# Patient Record
Sex: Male | Born: 1960 | Race: White | Hispanic: No | Marital: Single | Smoking: Former smoker
Health system: Southern US, Community
[De-identification: ages and names within clinical notes are randomized; demographics above are authoritative.]

## PROBLEM LIST (undated history)

## (undated) DIAGNOSIS — F32A Depression, unspecified: Secondary | ICD-10-CM

## (undated) DIAGNOSIS — F319 Bipolar disorder, unspecified: Secondary | ICD-10-CM

## (undated) DIAGNOSIS — I10 Essential (primary) hypertension: Secondary | ICD-10-CM

## (undated) DIAGNOSIS — F419 Anxiety disorder, unspecified: Secondary | ICD-10-CM

## (undated) DIAGNOSIS — I319 Disease of pericardium, unspecified: Secondary | ICD-10-CM

## (undated) DIAGNOSIS — K219 Gastro-esophageal reflux disease without esophagitis: Secondary | ICD-10-CM

## (undated) DIAGNOSIS — N200 Calculus of kidney: Secondary | ICD-10-CM

## (undated) DIAGNOSIS — M1990 Unspecified osteoarthritis, unspecified site: Secondary | ICD-10-CM

## (undated) DIAGNOSIS — R569 Unspecified convulsions: Secondary | ICD-10-CM

## (undated) DIAGNOSIS — G2581 Restless legs syndrome: Secondary | ICD-10-CM

## (undated) DIAGNOSIS — Z87442 Personal history of urinary calculi: Secondary | ICD-10-CM

## (undated) DIAGNOSIS — T8454XA Infection and inflammatory reaction due to internal left knee prosthesis, initial encounter: Secondary | ICD-10-CM

## (undated) DIAGNOSIS — F4310 Post-traumatic stress disorder, unspecified: Secondary | ICD-10-CM

## (undated) DIAGNOSIS — F329 Major depressive disorder, single episode, unspecified: Secondary | ICD-10-CM

## (undated) DIAGNOSIS — R55 Syncope and collapse: Secondary | ICD-10-CM

## (undated) DIAGNOSIS — K759 Inflammatory liver disease, unspecified: Secondary | ICD-10-CM

## (undated) HISTORY — PX: JOINT REPLACEMENT: SHX530

## (undated) HISTORY — PX: CYSTOSCOPY WITH URETEROSCOPY AND STENT PLACEMENT: SHX6377

## (undated) HISTORY — PX: APPENDECTOMY: SHX54

## (undated) HISTORY — DX: Infection and inflammatory reaction due to internal left knee prosthesis, initial encounter: T84.54XA

## (undated) HISTORY — DX: Essential (primary) hypertension: I10

## (undated) HISTORY — DX: Anxiety disorder, unspecified: F41.9

## (undated) HISTORY — DX: Bipolar disorder, unspecified: F31.9

## (undated) HISTORY — DX: Depression, unspecified: F32.A

---

## 2004-10-04 ENCOUNTER — Emergency Department: Payer: Self-pay | Admitting: Emergency Medicine

## 2007-02-01 ENCOUNTER — Other Ambulatory Visit: Payer: Self-pay

## 2007-02-01 ENCOUNTER — Emergency Department: Payer: Self-pay | Admitting: Emergency Medicine

## 2010-02-15 ENCOUNTER — Emergency Department: Payer: Self-pay | Admitting: Emergency Medicine

## 2010-05-13 ENCOUNTER — Inpatient Hospital Stay: Payer: Self-pay | Admitting: Psychiatry

## 2010-07-09 ENCOUNTER — Ambulatory Visit: Payer: Self-pay

## 2010-10-19 ENCOUNTER — Emergency Department: Payer: Self-pay | Admitting: Emergency Medicine

## 2010-11-15 ENCOUNTER — Emergency Department: Payer: Self-pay

## 2012-09-03 ENCOUNTER — Emergency Department: Payer: Self-pay | Admitting: Emergency Medicine

## 2014-05-16 ENCOUNTER — Other Ambulatory Visit: Payer: Self-pay | Admitting: Specialist

## 2014-05-16 DIAGNOSIS — M170 Bilateral primary osteoarthritis of knee: Secondary | ICD-10-CM

## 2014-05-19 ENCOUNTER — Ambulatory Visit: Payer: Self-pay

## 2014-05-28 ENCOUNTER — Ambulatory Visit
Admission: RE | Admit: 2014-05-28 | Discharge: 2014-05-28 | Disposition: A | Discharge status: Home / Self Care | Payer: Medicaid Other | Source: Ambulatory Visit | Attending: Specialist | Admitting: Specialist

## 2014-05-28 DIAGNOSIS — M948X6 Other specified disorders of cartilage, lower leg: Secondary | ICD-10-CM | POA: Diagnosis not present

## 2014-05-28 DIAGNOSIS — M170 Bilateral primary osteoarthritis of knee: Secondary | ICD-10-CM | POA: Diagnosis present

## 2014-05-28 DIAGNOSIS — S83241A Other tear of medial meniscus, current injury, right knee, initial encounter: Secondary | ICD-10-CM | POA: Insufficient documentation

## 2014-06-17 ENCOUNTER — Encounter: Payer: Self-pay | Admitting: *Deleted

## 2014-06-17 ENCOUNTER — Other Ambulatory Visit: Payer: Medicaid Other

## 2014-06-17 NOTE — Patient Instructions (Signed)
  Your procedure is scheduled on: 06-25-14 Report to [facility] 2ND FLOOR To find out your arrival time please call [phone] between 1PM - 3PM on 06-24-14  Remember: Instructions that are not followed completely may result in serious medical risk, up to and including death, or upon the discretion of your surgeon and anesthesiologist your surgery may need to be rescheduled.    _X___ 1. Do not eat food or drink liquids after midnight. No gum chewing or hard candies.     _X___ 2. No Alcohol for 24 hours before or after surgery.   ____ 3. Bring all medications with you on the day of surgery if instructed.    ____ 4. Notify your doctor if there is any change in your medical condition     (cold, fever, infections).     Do not wear jewelry, make-up, hairpins, clips or nail polish.  Do not wear lotions, powders, or perfumes. You may wear deodorant.  Do not shave 48 hours prior to surgery. Men may shave face and neck.  Do not bring valuables to the hospital.    [facility] is not responsible for any belongings or valuables.               Contacts, dentures or bridgework may not be worn into surgery.  Leave your suitcase in the car. After surgery it may be brought to your room.  For patients admitted to the hospital, discharge time is determined by your  treatment team.   Patients discharged the day of surgery will not be allowed to drive home.   Please read over the following fact sheets that you were given:     _X___ Take these medicines the morning of surgery with A SIP OF WATER:    1. CELEXA  2. DEPAKOTE  3.   4.  5.  6.  ____ Fleet Enema (as directed)   ____ Use CHG Soap as directed  ____ Use inhalers on the day of surgery  ____ Stop metformin 2 days prior to surgery    ____ Take 1/2 of usual insulin dose the night before surgery and none on the morning of surgery.   ____ Stop Coumadin/Plavix/aspirin-N/A  __X__ Stop Anti-inflammatories-PT STOPPED  MELOXICAM 06-17-14-NO NSAIDS OR ASA PRODUCTS-TRAMADOL OK TO CONTINUE   ____ Stop supplements until after surgery.    ____ Bring C-Pap to the hospital.

## 2014-06-25 ENCOUNTER — Encounter: Payer: Self-pay | Admitting: Anesthesiology

## 2014-06-25 ENCOUNTER — Encounter
Admission: RE | Disposition: A | Discharge status: Home / Self Care | Payer: Self-pay | Source: Ambulatory Visit | Attending: Specialist

## 2014-06-25 ENCOUNTER — Ambulatory Visit: Payer: Medicaid Other | Admitting: Anesthesiology

## 2014-06-25 ENCOUNTER — Ambulatory Visit
Admission: RE | Admit: 2014-06-25 | Discharge: 2014-06-25 | Disposition: A | Discharge status: Home / Self Care | Payer: Medicaid Other | Source: Ambulatory Visit | Attending: Specialist | Admitting: Specialist

## 2014-06-25 DIAGNOSIS — Z791 Long term (current) use of non-steroidal anti-inflammatories (NSAID): Secondary | ICD-10-CM | POA: Insufficient documentation

## 2014-06-25 DIAGNOSIS — M2550 Pain in unspecified joint: Secondary | ICD-10-CM | POA: Insufficient documentation

## 2014-06-25 DIAGNOSIS — I10 Essential (primary) hypertension: Secondary | ICD-10-CM | POA: Diagnosis not present

## 2014-06-25 DIAGNOSIS — M23306 Other meniscus derangements, unspecified meniscus, right knee: Secondary | ICD-10-CM | POA: Insufficient documentation

## 2014-06-25 DIAGNOSIS — F329 Major depressive disorder, single episode, unspecified: Secondary | ICD-10-CM | POA: Diagnosis not present

## 2014-06-25 DIAGNOSIS — Z87891 Personal history of nicotine dependence: Secondary | ICD-10-CM | POA: Insufficient documentation

## 2014-06-25 DIAGNOSIS — R51 Headache: Secondary | ICD-10-CM | POA: Insufficient documentation

## 2014-06-25 DIAGNOSIS — Z79899 Other long term (current) drug therapy: Secondary | ICD-10-CM | POA: Diagnosis not present

## 2014-06-25 DIAGNOSIS — M94261 Chondromalacia, right knee: Secondary | ICD-10-CM | POA: Insufficient documentation

## 2014-06-25 HISTORY — DX: Major depressive disorder, single episode, unspecified: F32.9

## 2014-06-25 HISTORY — DX: Unspecified osteoarthritis, unspecified site: M19.90

## 2014-06-25 HISTORY — PX: KNEE ARTHROSCOPY: SHX127

## 2014-06-25 HISTORY — DX: Depression, unspecified: F32.A

## 2014-06-25 SURGERY — ARTHROSCOPY, KNEE
Anesthesia: General | Site: Knee | Laterality: Right | Wound class: Clean

## 2014-06-25 MED ORDER — LACTATED RINGERS IV SOLN
INTRAVENOUS | Status: DC
  Administered 2014-06-25: 09:00:00 via INTRAVENOUS

## 2014-06-25 MED ORDER — BUPIVACAINE-EPINEPHRINE (PF) 0.25% -1:200000 IJ SOLN
INTRAMUSCULAR | Status: DC | PRN
  Administered 2014-06-25: 30 mL via PERINEURAL

## 2014-06-25 MED ORDER — GABAPENTIN 400 MG PO CAPS: 400.0000 mg | ORAL_CAPSULE | Freq: Three times a day (TID) | ORAL | Status: DC

## 2014-06-25 MED ORDER — FAMOTIDINE 20 MG PO TABS
20.0000 mg | ORAL_TABLET | Freq: Once | ORAL | Status: AC
  Administered 2014-06-25: 20 mg via ORAL

## 2014-06-25 MED ORDER — BUPIVACAINE-EPINEPHRINE (PF) 0.5% -1:200000 IJ SOLN
INTRAMUSCULAR | Status: DC | PRN
  Administered 2014-06-25: 30 mL via PERINEURAL

## 2014-06-25 MED ORDER — FENTANYL CITRATE (PF) 100 MCG/2ML IJ SOLN: 25.0000 ug | INTRAMUSCULAR | Status: DC | PRN

## 2014-06-25 MED ORDER — BUPIVACAINE-EPINEPHRINE (PF) 0.5% -1:200000 IJ SOLN
INTRAMUSCULAR | Status: AC
  Filled 2014-06-25: qty 60

## 2014-06-25 MED ORDER — LABETALOL HCL 5 MG/ML IV SOLN
INTRAVENOUS | Status: AC
  Administered 2014-06-25: 10 mg via INTRAVENOUS
  Filled 2014-06-25: qty 4

## 2014-06-25 MED ORDER — MIDAZOLAM HCL 2 MG/2ML IJ SOLN
INTRAMUSCULAR | Status: DC | PRN
  Administered 2014-06-25: 2 mg via INTRAVENOUS

## 2014-06-25 MED ORDER — ONDANSETRON HCL 4 MG/2ML IJ SOLN
4.0000 mg | Freq: Once | INTRAMUSCULAR | Status: DC | PRN
Start: 2014-06-25 — End: 2014-06-25

## 2014-06-25 MED ORDER — GABAPENTIN 400 MG PO CAPS
400.0000 mg | ORAL_CAPSULE | Freq: Once | ORAL | Status: AC
  Administered 2014-06-25: 400 mg via ORAL

## 2014-06-25 MED ORDER — HYDRALAZINE HCL 20 MG/ML IJ SOLN
INTRAMUSCULAR | Status: AC
  Administered 2014-06-25: 10 mg via INTRAVENOUS
  Filled 2014-06-25: qty 1

## 2014-06-25 MED ORDER — MELOXICAM 7.5 MG PO TABS
15.0000 mg | ORAL_TABLET | Freq: Once | ORAL | Status: AC
  Administered 2014-06-25: 15 mg via ORAL

## 2014-06-25 MED ORDER — LIDOCAINE HCL (CARDIAC) 20 MG/ML IV SOLN
INTRAVENOUS | Status: DC | PRN
  Administered 2014-06-25: 100 mg via INTRAVENOUS

## 2014-06-25 MED ORDER — CEFAZOLIN SODIUM-DEXTROSE 2-3 GM-% IV SOLR
INTRAVENOUS | Status: AC
  Administered 2014-06-25: 2 g via INTRAVENOUS
  Filled 2014-06-25: qty 50

## 2014-06-25 MED ORDER — MORPHINE SULFATE 4 MG/ML IJ SOLN
INTRAMUSCULAR | Status: AC
  Filled 2014-06-25: qty 1

## 2014-06-25 MED ORDER — ACETAMINOPHEN 10 MG/ML IV SOLN
INTRAVENOUS | Status: DC | PRN
  Administered 2014-06-25: 1000 mg via INTRAVENOUS

## 2014-06-25 MED ORDER — GABAPENTIN 400 MG PO CAPS
ORAL_CAPSULE | ORAL | Status: AC
  Filled 2014-06-25: qty 1

## 2014-06-25 MED ORDER — ONDANSETRON HCL 4 MG/2ML IJ SOLN
INTRAMUSCULAR | Status: DC | PRN
  Administered 2014-06-25: 4 mg via INTRAVENOUS

## 2014-06-25 MED ORDER — MORPHINE SULFATE 4 MG/ML IJ SOLN
INTRAMUSCULAR | Status: DC | PRN
  Administered 2014-06-25: 4 mg via INTRAVENOUS

## 2014-06-25 MED ORDER — LABETALOL HCL 5 MG/ML IV SOLN
10.0000 mg | Freq: Once | INTRAVENOUS | Status: AC
  Administered 2014-06-25: 10 mg via INTRAVENOUS

## 2014-06-25 MED ORDER — ACETAMINOPHEN 10 MG/ML IV SOLN
INTRAVENOUS | Status: AC
  Filled 2014-06-25: qty 100

## 2014-06-25 MED ORDER — FENTANYL CITRATE (PF) 100 MCG/2ML IJ SOLN
INTRAMUSCULAR | Status: DC | PRN
  Administered 2014-06-25: 25 ug via INTRAVENOUS

## 2014-06-25 MED ORDER — MELOXICAM 7.5 MG PO TABS
ORAL_TABLET | ORAL | Status: AC
  Filled 2014-06-25: qty 2

## 2014-06-25 MED ORDER — HYDRALAZINE HCL 20 MG/ML IJ SOLN
10.0000 mg | Freq: Once | INTRAMUSCULAR | Status: AC
  Administered 2014-06-25: 10 mg via INTRAVENOUS

## 2014-06-25 MED ORDER — CEFAZOLIN SODIUM-DEXTROSE 2-3 GM-% IV SOLR: 2.0000 g | Freq: Once | INTRAVENOUS | Status: DC

## 2014-06-25 MED ORDER — HYDROCODONE-ACETAMINOPHEN 5-325 MG PO TABS: 1.0000 | ORAL_TABLET | Freq: Four times a day (QID) | ORAL | Status: DC | PRN

## 2014-06-25 MED ORDER — FAMOTIDINE 20 MG PO TABS
ORAL_TABLET | ORAL | Status: AC
  Filled 2014-06-25: qty 1

## 2014-06-25 MED ORDER — PROPOFOL 10 MG/ML IV BOLUS
INTRAVENOUS | Status: DC | PRN
  Administered 2014-06-25: 200 mg via INTRAVENOUS

## 2014-06-25 SURGICAL SUPPLY — 24 items
BAG COUNTER SPONGE EZ (MISCELLANEOUS) IMPLANT
BLADE AGGRESSIVE PLUS 4.0 (BLADE) ×3 IMPLANT
BUR RADIUS 4.0X18.5 (BURR) IMPLANT
CHLORAPREP W/TINT 26ML (MISCELLANEOUS) ×3 IMPLANT
COUNTER SPONGE BAG EZ (MISCELLANEOUS)
CUTTER SLOTTED WHISKER 4.0 (BURR) IMPLANT
GAUZE SPONGE 4X4 12PLY STRL (GAUZE/BANDAGES/DRESSINGS) ×3 IMPLANT
GLOVE BIO SURGEON STRL SZ8 (GLOVE) ×3 IMPLANT
GOWN STRL REUS W/ TWL LRG LVL3 (GOWN DISPOSABLE) ×2 IMPLANT
GOWN STRL REUS W/TWL LRG LVL3 (GOWN DISPOSABLE) ×4
IV LACTATED RINGER IRRG 3000ML (IV SOLUTION) ×12
IV LR IRRIG 3000ML ARTHROMATIC (IV SOLUTION) ×6 IMPLANT
KIT RM TURNOVER STRD PROC AR (KITS) ×3 IMPLANT
MANIFOLD NEPTUNE II (INSTRUMENTS) ×3 IMPLANT
NDL SAFETY 18GX1.5 (NEEDLE) ×6 IMPLANT
PACK ARTHROSCOPY KNEE (MISCELLANEOUS) ×3 IMPLANT
SET TUBE SUCT SHAVER OUTFL 24K (TUBING) ×3 IMPLANT
SOL PREP PVP 2OZ (MISCELLANEOUS) ×3
SOLUTION PREP PVP 2OZ (MISCELLANEOUS) ×1 IMPLANT
SUT ETHILON 3-0 FS-10 30 BLK (SUTURE) ×3
SUTURE EHLN 3-0 FS-10 30 BLK (SUTURE) ×1 IMPLANT
SYR 30ML LL (SYRINGE) ×6 IMPLANT
TUBING ARTHRO INFLOW-ONLY STRL (TUBING) ×3 IMPLANT
WAND HAND CNTRL MULTIVAC 50 (MISCELLANEOUS) ×3 IMPLANT

## 2014-06-25 NOTE — Op Note (Signed)
06/25/2014  11:43 AM  PATIENT:  [name]    PRE-OPERATIVE DIAGNOSIS:  DERANGEMENT OF KNEE  POST-OPERATIVE DIAGNOSIS:  Same  PROCEDURE:  Arthroscopic partial right medial meniscectomy  SURGEON:  Milano Rosevear E, MD  COMPLICATIONS:   None  EBL:  Minimal  TOURNIQUET TIME:   None  ANESTHESIA:  Gen. LMA  PREOPERATIVE INDICATIONS:  [name] is a  54 y.o. male with a diagnosis of DERANGEMENT OF KNEE who failed conservative measures and elected for surgical management.    The risks benefits and alternatives were discussed with the patient preoperatively including but not limited to the risks of infection, bleeding, nerve injury, cardiopulmonary complications, the need for revision surgery, among others, and the patient was willing to proceed.  OPERATIVE IMPLANTS: None  OPERATIVE FINDINGS: Severely macerated and torn posterior horn right medial meniscus.  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  Normal anterior and posterior cruciates.  Normal lateral compartment, except for small chondromalacia area anterolaterally.  Severe synovitis throughout the joint.  Patellofemoral joint normal.  Multiple small particulate loose bodies.  OPERATIVE PROCEDURE: The patient was brought to the operating room and underwent satisfactory general LMA anesthesia in the supine position.  Right leg was prepped and draped in a sterile fashion.  Arthroscopy was carried out through standard portals.  The above findings were encountered upon arthroscopy.  There was grade 4 chondromalacia on portions of the medial femoral condyle and tibia.  The posterior medial meniscus was severely macerated and torn horizontally.  This was debrided with basket forceps, motorized resector and ArthroCare wand.  Once this was completed and stabilized the joint was thoroughly irrigated.  Minimal debridement of the condyle was carried out.  Instruments were removed and knee wounds closed with 3-0 nylon.  A  dry sterile dressing was applied.  Patient was awakened and taken to recovery in good condition.  Valinda Hoar, MD

## 2014-06-25 NOTE — Discharge Instructions (Signed)
Partial weight bearing on right leg. Ice and elevate the leg

## 2014-06-25 NOTE — Op Note (Signed)
06/25/2014  11:50 AM  PATIENT:  [name]    PRE-OPERATIVE DIAGNOSIS:  DERANGEMENT OF KNEE  POST-OPERATIVE DIAGNOSIS:  Same  PROCEDURE:  Arthroscopic partial right medial meniscectomy  SURGEON:  Kilani Joffe E, MD  COMPLICATIONS:   None  EBL:  Minimal  TOURNIQUET TIME:   None  ANESTHESIA:  Gen. LMA  PREOPERATIVE INDICATIONS:  [name] is a  54 y.o. male with a diagnosis of DERANGEMENT OF KNEE who failed conservative measures and elected for surgical management.    The risks benefits and alternatives were discussed with the patient preoperatively including but not limited to the risks of infection, bleeding, nerve injury, cardiopulmonary complications, the need for revision surgery, among others, and the patient was willing to proceed.  OPERATIVE IMPLANTS: None  OPERATIVE FINDINGS: Severely macerated and torn posterior horn right medial meniscus.  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  Normal anterior and posterior cruciates.  Normal lateral compartment, except for small chondromalacia area anterolaterally.  Severe synovitis throughout the joint.  Patellofemoral joint normal.  Multiple small particulate loose bodies.  OPERATIVE PROCEDURE: The patient was brought to the operating room and underwent satisfactory general LMA anesthesia in the supine position.  Right leg was prepped and draped in a sterile fashion.  Arthroscopy was carried out through standard portals.  The above findings were encountered upon arthroscopy.  There was grade 4 chondromalacia on portions of the medial femoral condyle and tibia.  The posterior medial meniscus was severely macerated and torn horizontally.  This was debrided with basket forceps, motorized resector and ArthroCare wand.  Once this was completed and stabilized the joint was thoroughly irrigated.  Minimal debridement of the condyle was carried out.  Instruments were removed and knee wounds closed with 3-0 nylon.  A  dry sterile dressing was applied.  Patient was awakened and taken to recovery in good condition.  Valinda Hoar, MD

## 2014-06-25 NOTE — Anesthesia Preprocedure Evaluation (Signed)
Anesthesia Evaluation  Patient identified by MRN, date of birth, ID band Patient awake    Reviewed: Allergy & Precautions, NPO status , Patient's Chart, lab work & pertinent test results  Airway Mallampati: II  TM Distance: >3 FB Neck ROM: Full    Dental  (+) Chipped Chipped front teeth:   Pulmonary former smoker,  breath sounds clear to auscultation  Pulmonary exam normal       Cardiovascular negative cardio ROS Normal cardiovascular examRhythm:Regular Rate:Normal     Neuro/Psych PSYCHIATRIC DISORDERS Depression negative neurological ROS     GI/Hepatic negative GI ROS, Neg liver ROS,   Endo/Other  negative endocrine ROS  Renal/GU Hx of Kidney stones  negative genitourinary   Musculoskeletal  (+) Arthritis -, Osteoarthritis,    Abdominal Normal abdominal exam  (+)   Peds negative pediatric ROS (+)  Hematology negative hematology ROS (+)   Anesthesia Other Findings   Reproductive/Obstetrics                             Anesthesia Physical Anesthesia Plan  ASA: II  Anesthesia Plan: General   Post-op Pain Management:    Induction: Intravenous  Airway Management Planned: LMA  Additional Equipment:   Intra-op Plan:   Post-operative Plan: Extubation in OR  Informed Consent: I have reviewed the patients History and Physical, chart, labs and discussed the procedure including the risks, benefits and alternatives for the proposed anesthesia with the patient or authorized representative who has indicated his/her understanding and acceptance.   Dental advisory given  Plan Discussed with: CRNA and Surgeon  Anesthesia Plan Comments:         Anesthesia Quick Evaluation

## 2014-06-25 NOTE — Transfer of Care (Signed)
Immediate Anesthesia Transfer of Care Note  Patient: [name]  Procedure(s) Performed: Procedure(s) with comments: ARTHROSCOPY KNEE (Right) - partial arthroscopic medial menisectomy  Patient Location: PACU  Anesthesia Type:General  Level of Consciousness: sedated  Airway & Oxygen Therapy: Patient Spontanous Breathing and Patient connected to face mask oxygen  Post-op Assessment: Report given to RN and Post -op Vital signs reviewed and stable  Post vital signs: Reviewed and stable  Last Vitals:  Filed Vitals:   06/25/14 1146  BP: 137/90  Pulse: 63  Temp: 36.1 C  Resp:     Complications: No apparent anesthesia complications

## 2014-06-25 NOTE — Anesthesia Postprocedure Evaluation (Signed)
  Anesthesia Post-op Note  Patient: [name]  Procedure(s) Performed: Procedure(s) with comments: ARTHROSCOPY KNEE (Right) - partial arthroscopic medial menisectomy  Anesthesia type:General  Patient location: PACU  Post pain: Pain level controlled  Post assessment: Post-op Vital signs reviewed, Patient's Cardiovascular Status Stable, Respiratory Function Stable, Patent Airway and No signs of Nausea or vomiting  Post vital signs: Reviewed and stable  Last Vitals:  Filed Vitals:   06/25/14 1325  BP: 112/72  Pulse: 72  Temp: 35.8 C  Resp: 16    Level of consciousness: awake, alert  and patient cooperative  Complications: No apparent anesthesia complications

## 2014-06-25 NOTE — H&P (Signed)
THE PATIENT WAS SEEN IN THE HOLDING AREA.  HISTORY, ALLERGIES, HOME MEDICATIONS AND OPERATIVE PROCEDURE WERE REVIEWED. RISKS AND BENEFITS OF SURGERY DISCUSSED WITH PATIENT AGAIN.  NO CHANGES FROM INITIAL HISTORY AND PHYSICAL NOTED.    

## 2015-03-19 ENCOUNTER — Other Ambulatory Visit: Payer: Self-pay | Admitting: Specialist

## 2015-04-15 ENCOUNTER — Encounter
Admission: RE | Admit: 2015-04-15 | Discharge: 2015-04-15 | Disposition: A | Discharge status: Home / Self Care | Payer: Medicaid Other | Source: Ambulatory Visit | Attending: Specialist | Admitting: Specialist

## 2015-04-15 DIAGNOSIS — Z01812 Encounter for preprocedural laboratory examination: Secondary | ICD-10-CM | POA: Insufficient documentation

## 2015-04-15 HISTORY — DX: Gastro-esophageal reflux disease without esophagitis: K21.9

## 2015-04-15 HISTORY — DX: Restless legs syndrome: G25.81

## 2015-04-15 HISTORY — DX: Post-traumatic stress disorder, unspecified: F43.10

## 2015-04-15 HISTORY — DX: Syncope and collapse: R55

## 2015-04-15 HISTORY — DX: Inflammatory liver disease, unspecified: K75.9

## 2015-04-15 HISTORY — DX: Anxiety disorder, unspecified: F41.9

## 2015-04-15 HISTORY — DX: Bipolar disorder, unspecified: F31.9

## 2015-04-15 LAB — TYPE AND SCREEN
ABO/RH(D): O POS
Antibody Screen: NEGATIVE

## 2015-04-15 LAB — CBC WITH DIFFERENTIAL/PLATELET
BASOS ABS: 0 10*3/uL (ref 0–0.1)
Basophils Relative: 0 %
Eosinophils Absolute: 0 10*3/uL (ref 0–0.7)
Eosinophils Relative: 0 %
HEMATOCRIT: 37.1 % — AB (ref 40.0–52.0)
HEMOGLOBIN: 12.9 g/dL — AB (ref 13.0–18.0)
LYMPHS PCT: 22 %
Lymphs Abs: 1.3 10*3/uL (ref 1.0–3.6)
MCH: 31.5 pg (ref 26.0–34.0)
MCHC: 34.8 g/dL (ref 32.0–36.0)
MCV: 90.4 fL (ref 80.0–100.0)
Monocytes Absolute: 0.6 10*3/uL (ref 0.2–1.0)
Monocytes Relative: 10 %
NEUTROS ABS: 4.1 10*3/uL (ref 1.4–6.5)
Neutrophils Relative %: 68 %
Platelets: 181 10*3/uL (ref 150–440)
RBC: 4.11 MIL/uL — AB (ref 4.40–5.90)
RDW: 14.8 % — ABNORMAL HIGH (ref 11.5–14.5)
WBC: 6 10*3/uL (ref 3.8–10.6)

## 2015-04-15 LAB — COMPREHENSIVE METABOLIC PANEL
ALT: 18 U/L (ref 17–63)
AST: 26 U/L (ref 15–41)
Albumin: 4.1 g/dL (ref 3.5–5.0)
Alkaline Phosphatase: 58 U/L (ref 38–126)
Anion gap: 8 (ref 5–15)
BILIRUBIN TOTAL: 0.6 mg/dL (ref 0.3–1.2)
BUN: 16 mg/dL (ref 6–20)
CO2: 24 mmol/L (ref 22–32)
CREATININE: 0.97 mg/dL (ref 0.61–1.24)
Calcium: 9.2 mg/dL (ref 8.9–10.3)
Chloride: 106 mmol/L (ref 101–111)
GFR calc Af Amer: >60 mL/min (ref >60)
Glucose, Bld: 94 mg/dL (ref 65–99)
Potassium: 3.8 mmol/L (ref 3.5–5.1)
Sodium: 138 mmol/L (ref 135–145)
TOTAL PROTEIN: 7 g/dL (ref 6.5–8.1)

## 2015-04-15 LAB — PROTIME-INR
INR: 1.13
Prothrombin Time: 14.7 seconds (ref 11.4–15.0)

## 2015-04-15 LAB — ABO/RH: ABO/RH(D): O POS

## 2015-04-15 LAB — APTT: APTT: 25 s (ref 24–36)

## 2015-04-15 LAB — SURGICAL PCR SCREEN
MRSA, PCR: NEGATIVE
Staphylococcus aureus: NEGATIVE

## 2015-04-15 NOTE — Patient Instructions (Signed)
  Your procedure is scheduled on: April 22, 2015 (Wednesday) Report to Day Surgery.([facility]) SECOND FLOOR To find out your arrival time please call [phone] between 1PM - 3PM on April 21, 2015 (Tuesday).  Remember: Instructions that are not followed completely may result in serious medical risk, up to and including death, or upon the discretion of your surgeon and anesthesiologist your surgery may need to be rescheduled.    ___x_ 1. Do not eat food or drink liquids after midnight. No gum chewing or hard candies.     ___x_ 2. No Alcohol for 24 hours before or after surgery.   ____ 3. Bring all medications with you on the day of surgery if instructed.    __x__ 4. Notify your doctor if there is any change in your medical condition     (cold, fever, infections).     Do not wear jewelry, make-up, hairpins, clips or nail polish.  Do not wear lotions, powders, or perfumes. You may wear deodorant.  Do not shave 48 hours prior to surgery. Men may shave face and neck.  Do not bring valuables to the hospital.    [facility] is not responsible for any belongings or valuables.               Contacts, dentures or bridgework may not be worn into surgery.  Leave your suitcase in the car. After surgery it may be brought to your room.  For patients admitted to the hospital, discharge time is determined by your                treatment team.   Patients discharged the day of surgery will not be allowed to drive home.   Please read over the following fact sheets that you were given:   MRSA Information and Surgical Site Infection Prevention   __x__ Take these medicines the morning of surgery with A SIP OF WATER:    1. Depakote  2. Enalapril  3.   4.  5.  6.  ____ Fleet Enema (as directed)   __x__ Use CHG Soap as directed  ____ Use inhalers on the day of surgery  ____ Stop metformin 2 days prior to surgery    ____ Take 1/2 of usual insulin dose the night before surgery and none on  the morning of surgery.   __x__ Stop Coumadin/Plavix/aspirin on (Patient has stopped Aspirin on April 1)  _x___ Stop Anti-inflammatories on (Patient stopped Meloxicam on April 1) NO NSAIDS)    ____ Stop supplements until after surgery.    ____ Bring C-Pap to the hospital.

## 2015-04-16 ENCOUNTER — Encounter
Admission: RE | Admit: 2015-04-16 | Discharge: 2015-04-16 | Disposition: A | Discharge status: Home / Self Care | Payer: Medicaid Other | Source: Ambulatory Visit | Attending: Specialist | Admitting: Specialist

## 2015-04-16 DIAGNOSIS — Z01812 Encounter for preprocedural laboratory examination: Secondary | ICD-10-CM | POA: Diagnosis present

## 2015-04-16 LAB — URINALYSIS COMPLETE WITH MICROSCOPIC (ARMC ONLY)
BILIRUBIN URINE: NEGATIVE
Bacteria, UA: NONE SEEN
Glucose, UA: NEGATIVE mg/dL
Hgb urine dipstick: NEGATIVE
KETONES UR: NEGATIVE mg/dL
Leukocytes, UA: NEGATIVE
Nitrite: NEGATIVE
PROTEIN: NEGATIVE mg/dL
Specific Gravity, Urine: 1.008 (ref 1.005–1.030)
Squamous Epithelial / LPF: NONE SEEN
pH: 7 (ref 5.0–8.0)

## 2015-04-16 LAB — HEMOGLOBIN A1C: HEMOGLOBIN A1C: 5.3 % (ref 4.0–6.0)

## 2015-04-17 NOTE — Pre-Procedure Instructions (Signed)
Medical clearance received from Dr. Rogue JuryLaura Ruhl and stated patient is low risk for surgery.

## 2015-04-21 ENCOUNTER — Encounter: Payer: Self-pay | Admitting: Specialist

## 2015-04-21 NOTE — H&P (Signed)
TOTAL KNEE ADMISSION H&P  Patient is being admitted for right total knee arthroplasty.  Subjective:  Chief Complaint:right knee pain.  HPI: [name], 55 y.o. male, has a history of pain and functional disability in the right knee due to arthritis and has failed non-surgical conservative treatments for greater than 12 weeks to includeNSAID's and/or analgesics, corticosteriod injections, flexibility and strengthening excercises, supervised PT with diminished ADL's post treatment and activity modification.  Onset of symptoms was gradual, starting 8 years ago with gradually worsening course since that time. The patient noted no past surgery on the right knee(s).  Patient currently rates pain in the right knee(s) at 7 out of 10 with activity. Patient has night pain, worsening of pain with activity and weight bearing, pain that interferes with activities of daily living, pain with passive range of motion and joint swelling.  Patient has evidence of subchondral cysts, periarticular osteophytes, joint subluxation and joint space narrowing by imaging studies. This patient has had  . There is no active infection.  There are no active problems to display for this patient.  Past Medical History  Diagnosis Date  . Arthritis     knees and hands  . Chronic kidney disease     kidney stones  . Depression   . Restless leg syndrome   . Bipolar disorder (HCC)   . Anxiety   . PTSD (post-traumatic stress disorder)   . GERD (gastroesophageal reflux disease)   . Syncope   . Hepatitis     HEP "C"    Past Surgical History  Procedure Laterality Date  . Cystoscopy with ureteroscopy and stent placement    . Knee arthroscopy Right 06/25/2014    Procedure: ARTHROSCOPY KNEE;  Surgeon: Deeann SaintHoward Kyshawn Teal, MD;  Location: [facility] ORS;  Service: Orthopedics;  Laterality: Right;  partial arthroscopic medial menisectomy    No prescriptions prior to admission   Allergies  Allergen Reactions  . Toradol [Ketorolac  Tromethamine] Hives    Social History  Substance Use Topics  . Smoking status: Former Smoker -- 1.00 packs/day    Types: Cigarettes    Quit date: 05/16/1984  . Smokeless tobacco: Never Used  . Alcohol Use: No    History reviewed. No pertinent family history.   Review of Systems  Constitutional: Negative.   HENT: Negative.   Eyes: Negative.   Respiratory: Negative.   Cardiovascular: Negative.   Gastrointestinal: Negative.   Genitourinary: Negative.   Musculoskeletal:       Severe right knee pain  Skin: Negative.   Neurological: Negative.   Endo/Heme/Allergies: Negative.   Psychiatric/Behavioral: Negative.     Objective:  Physical Exam  Constitutional: He is oriented to person, place, and time. He appears well-developed and well-nourished.  HENT:  Head: Normocephalic.  Eyes: Pupils are equal, round, and reactive to light.  Neck: Normal range of motion. Neck supple.  Cardiovascular: Normal rate and regular rhythm.   Respiratory: Effort normal.  GI: Soft.  Musculoskeletal:       Right knee: He exhibits decreased range of motion, swelling and deformity. Tenderness found. Medial joint line tenderness noted.  Neurological: He is alert and oriented to person, place, and time.  Skin: Skin is warm.  Psychiatric: He has a normal mood and affect.    Vital signs in last 24 hours:    Labs:   Estimated body mass index is 27.16 kg/(m^2) as calculated from the following:   Height as of 06/25/14: 5\' 9"  (1.753 m).   Weight as of 06/25/14:  83.462 kg (184 lb).   Imaging Review Plain radiographs demonstrate severe degenerative joint disease of the right knee(s). The overall alignment ismild varus. The bone quality appears to be good for age and reported activity level.  Assessment/Plan:  End stage arthritis, right knee   The patient history, physical examination, clinical judgment of the provider and imaging studies are consistent with end stage degenerative joint disease of  the right knee(s) and total knee arthroplasty is deemed medically necessary. The treatment options including medical management, injection therapy arthroscopy and arthroplasty were discussed at length. The risks and benefits of total knee arthroplasty were presented and reviewed. The risks due to aseptic loosening, infection, stiffness, patella tracking problems, thromboembolic complications and other imponderables were discussed. The patient acknowledged the explanation, agreed to proceed with the plan and consent was signed. Patient is being admitted for inpatient treatment for surgery, pain control, PT, OT, prophylactic antibiotics, VTE prophylaxis, progressive ambulation and ADL's and discharge planning. The patient is planning to be discharged home with home health services

## 2015-04-22 ENCOUNTER — Encounter
Admission: RE | Disposition: A | Discharge status: Home / Self Care | Payer: Self-pay | Source: Ambulatory Visit | Attending: Specialist

## 2015-04-22 ENCOUNTER — Inpatient Hospital Stay: Payer: Medicaid Other

## 2015-04-22 ENCOUNTER — Encounter: Payer: Self-pay | Admitting: *Deleted

## 2015-04-22 ENCOUNTER — Inpatient Hospital Stay: Payer: Medicaid Other | Admitting: Anesthesiology

## 2015-04-22 ENCOUNTER — Inpatient Hospital Stay
Admission: RE | Admit: 2015-04-22 | Discharge: 2015-04-25 | DRG: 470 | Disposition: A | Discharge status: Home / Self Care | Payer: Medicaid Other | Source: Ambulatory Visit | Attending: Specialist | Admitting: Specialist

## 2015-04-22 DIAGNOSIS — Z888 Allergy status to other drugs, medicaments and biological substances status: Secondary | ICD-10-CM | POA: Diagnosis not present

## 2015-04-22 DIAGNOSIS — F4310 Post-traumatic stress disorder, unspecified: Secondary | ICD-10-CM | POA: Diagnosis present

## 2015-04-22 DIAGNOSIS — M1711 Unilateral primary osteoarthritis, right knee: Secondary | ICD-10-CM | POA: Diagnosis present

## 2015-04-22 DIAGNOSIS — Z87891 Personal history of nicotine dependence: Secondary | ICD-10-CM | POA: Diagnosis not present

## 2015-04-22 DIAGNOSIS — R339 Retention of urine, unspecified: Secondary | ICD-10-CM | POA: Diagnosis not present

## 2015-04-22 DIAGNOSIS — F419 Anxiety disorder, unspecified: Secondary | ICD-10-CM | POA: Diagnosis present

## 2015-04-22 DIAGNOSIS — K219 Gastro-esophageal reflux disease without esophagitis: Secondary | ICD-10-CM | POA: Diagnosis present

## 2015-04-22 DIAGNOSIS — F329 Major depressive disorder, single episode, unspecified: Secondary | ICD-10-CM | POA: Diagnosis present

## 2015-04-22 DIAGNOSIS — G2581 Restless legs syndrome: Secondary | ICD-10-CM | POA: Diagnosis present

## 2015-04-22 DIAGNOSIS — B1920 Unspecified viral hepatitis C without hepatic coma: Secondary | ICD-10-CM | POA: Diagnosis present

## 2015-04-22 DIAGNOSIS — I129 Hypertensive chronic kidney disease with stage 1 through stage 4 chronic kidney disease, or unspecified chronic kidney disease: Secondary | ICD-10-CM | POA: Diagnosis present

## 2015-04-22 DIAGNOSIS — N189 Chronic kidney disease, unspecified: Secondary | ICD-10-CM | POA: Diagnosis present

## 2015-04-22 DIAGNOSIS — F319 Bipolar disorder, unspecified: Secondary | ICD-10-CM | POA: Diagnosis present

## 2015-04-22 DIAGNOSIS — Z96659 Presence of unspecified artificial knee joint: Secondary | ICD-10-CM

## 2015-04-22 HISTORY — PX: TOTAL KNEE ARTHROPLASTY: SHX125

## 2015-04-22 LAB — CBC
HEMATOCRIT: 36 % — AB (ref 40.0–52.0)
Hemoglobin: 12.3 g/dL — ABNORMAL LOW (ref 13.0–18.0)
MCH: 31.1 pg (ref 26.0–34.0)
MCHC: 34.1 g/dL (ref 32.0–36.0)
MCV: 91 fL (ref 80.0–100.0)
PLATELETS: 167 10*3/uL (ref 150–440)
RBC: 3.95 MIL/uL — ABNORMAL LOW (ref 4.40–5.90)
RDW: 14.3 % (ref 11.5–14.5)
WBC: 5.7 10*3/uL (ref 3.8–10.6)

## 2015-04-22 LAB — CREATININE, SERUM: CREATININE: 0.93 mg/dL (ref 0.61–1.24)

## 2015-04-22 SURGERY — ARTHROPLASTY, KNEE, TOTAL
Anesthesia: Spinal | Laterality: Right | Wound class: Clean

## 2015-04-22 MED ORDER — ONDANSETRON HCL 4 MG PO TABS
4.0000 mg | ORAL_TABLET | Freq: Four times a day (QID) | ORAL | Status: DC | PRN
Start: 2015-04-22 — End: 2015-04-25

## 2015-04-22 MED ORDER — ONDANSETRON HCL 4 MG/2ML IJ SOLN: 4.0000 mg | Freq: Four times a day (QID) | INTRAMUSCULAR | Status: DC | PRN

## 2015-04-22 MED ORDER — FLEET ENEMA 7-19 GM/118ML RE ENEM: 1.0000 | ENEMA | Freq: Once | RECTAL | Status: DC | PRN

## 2015-04-22 MED ORDER — ACETAMINOPHEN 500 MG PO TABS
1000.0000 mg | ORAL_TABLET | Freq: Four times a day (QID) | ORAL | Status: AC
  Administered 2015-04-22 – 2015-04-23 (×4): 1000 mg via ORAL
  Filled 2015-04-22 (×4): qty 2

## 2015-04-22 MED ORDER — SODIUM CHLORIDE FLUSH 0.9 % IV SOLN
INTRAVENOUS | Status: AC
  Administered 2015-04-22: 10 mL
  Filled 2015-04-22: qty 3

## 2015-04-22 MED ORDER — VANCOMYCIN HCL 10 G IV SOLR
1500.0000 mg | Freq: Two times a day (BID) | INTRAVENOUS | Status: AC
  Administered 2015-04-22: 1500 mg via INTRAVENOUS
  Filled 2015-04-22 (×2): qty 1500

## 2015-04-22 MED ORDER — BUPIVACAINE HCL (PF) 0.5 % IJ SOLN
INTRAMUSCULAR | Status: DC | PRN
  Administered 2015-04-22: 3 mL

## 2015-04-22 MED ORDER — PREGABALIN 75 MG PO CAPS
75.0000 mg | ORAL_CAPSULE | Freq: Once | ORAL | Status: AC
  Administered 2015-04-22: 75 mg via ORAL

## 2015-04-22 MED ORDER — FAMOTIDINE 20 MG PO TABS
ORAL_TABLET | ORAL | Status: AC
  Filled 2015-04-22: qty 1

## 2015-04-22 MED ORDER — DIVALPROEX SODIUM ER 500 MG PO TB24
500.0000 mg | ORAL_TABLET | Freq: Two times a day (BID) | ORAL | Status: DC
  Filled 2015-04-22 (×2): qty 1

## 2015-04-22 MED ORDER — CELECOXIB 200 MG PO CAPS: 200.0000 mg | ORAL_CAPSULE | Freq: Every day | ORAL | Status: DC

## 2015-04-22 MED ORDER — SODIUM CHLORIDE 0.9 % IJ SOLN
INTRAMUSCULAR | Status: AC
  Filled 2015-04-22: qty 50

## 2015-04-22 MED ORDER — MAGNESIUM HYDROXIDE 400 MG/5ML PO SUSP
30.0000 mL | Freq: Every day | ORAL | Status: DC | PRN
  Administered 2015-04-23 – 2015-04-24 (×2): 30 mL via ORAL
  Filled 2015-04-22 (×2): qty 30

## 2015-04-22 MED ORDER — FENTANYL CITRATE (PF) 100 MCG/2ML IJ SOLN
INTRAMUSCULAR | Status: DC | PRN
  Administered 2015-04-22 (×2): 50 ug via INTRAVENOUS

## 2015-04-22 MED ORDER — FAMOTIDINE 20 MG PO TABS
20.0000 mg | ORAL_TABLET | Freq: Once | ORAL | Status: AC
  Administered 2015-04-22: 20 mg via ORAL

## 2015-04-22 MED ORDER — CHLORHEXIDINE GLUCONATE 4 % EX LIQD
1.0000 | Freq: Once | CUTANEOUS | Status: DC
Start: 2015-04-22 — End: 2015-04-22

## 2015-04-22 MED ORDER — ENALAPRIL MALEATE 10 MG PO TABS
10.0000 mg | ORAL_TABLET | Freq: Every day | ORAL | Status: DC
  Administered 2015-04-23 – 2015-04-25 (×3): 10 mg via ORAL
  Filled 2015-04-22 (×3): qty 1

## 2015-04-22 MED ORDER — FERROUS SULFATE 325 (65 FE) MG PO TABS
325.0000 mg | ORAL_TABLET | Freq: Three times a day (TID) | ORAL | Status: DC
  Administered 2015-04-22 – 2015-04-25 (×8): 325 mg via ORAL
  Filled 2015-04-22 (×7): qty 1

## 2015-04-22 MED ORDER — MENTHOL 3 MG MT LOZG
1.0000 | LOZENGE | OROMUCOSAL | Status: DC | PRN
  Filled 2015-04-22: qty 9

## 2015-04-22 MED ORDER — QUETIAPINE FUMARATE ER 200 MG PO TB24
400.0000 mg | ORAL_TABLET | Freq: Every day | ORAL | Status: DC
  Administered 2015-04-22 – 2015-04-24 (×3): 400 mg via ORAL
  Filled 2015-04-22 (×2): qty 2
  Filled 2015-04-22: qty 1
  Filled 2015-04-22: qty 2

## 2015-04-22 MED ORDER — PHENOL 1.4 % MT LIQD
1.0000 | OROMUCOSAL | Status: DC | PRN
Start: 2015-04-22 — End: 2015-04-25
  Filled 2015-04-22: qty 177

## 2015-04-22 MED ORDER — NEOMYCIN-POLYMYXIN B GU 40-200000 IR SOLN
Status: AC
  Filled 2015-04-22: qty 20

## 2015-04-22 MED ORDER — MORPHINE SULFATE (PF) 2 MG/ML IV SOLN
2.0000 mg | INTRAVENOUS | Status: DC | PRN
  Administered 2015-04-22 – 2015-04-23 (×6): 2 mg via INTRAVENOUS
  Filled 2015-04-22 (×6): qty 1

## 2015-04-22 MED ORDER — BUPIVACAINE LIPOSOME 1.3 % IJ SUSP
INTRAMUSCULAR | Status: AC
  Filled 2015-04-22: qty 20

## 2015-04-22 MED ORDER — FLUOXETINE HCL 20 MG PO CAPS
20.0000 mg | ORAL_CAPSULE | Freq: Every day | ORAL | Status: DC
  Administered 2015-04-22 – 2015-04-25 (×4): 20 mg via ORAL
  Filled 2015-04-22 (×4): qty 1

## 2015-04-22 MED ORDER — NEOMYCIN-POLYMYXIN B GU 40-200000 IR SOLN
Status: DC | PRN
  Administered 2015-04-22: 4 mL

## 2015-04-22 MED ORDER — DIVALPROEX SODIUM 250 MG PO DR TAB
500.0000 mg | DELAYED_RELEASE_TABLET | Freq: Two times a day (BID) | ORAL | Status: DC
  Administered 2015-04-22 – 2015-04-25 (×6): 500 mg via ORAL
  Filled 2015-04-22 (×6): qty 2

## 2015-04-22 MED ORDER — LACTATED RINGERS IV SOLN
INTRAVENOUS | Status: DC
  Administered 2015-04-22 (×2): via INTRAVENOUS

## 2015-04-22 MED ORDER — CELECOXIB 200 MG PO CAPS
200.0000 mg | ORAL_CAPSULE | Freq: Two times a day (BID) | ORAL | Status: DC
  Administered 2015-04-22 – 2015-04-25 (×7): 200 mg via ORAL
  Filled 2015-04-22 (×7): qty 1

## 2015-04-22 MED ORDER — PREGABALIN 75 MG PO CAPS
ORAL_CAPSULE | ORAL | Status: AC
  Filled 2015-04-22: qty 1

## 2015-04-22 MED ORDER — METHOCARBAMOL 1000 MG/10ML IJ SOLN
500.0000 mg | Freq: Four times a day (QID) | INTRAVENOUS | Status: DC | PRN
  Filled 2015-04-22: qty 5

## 2015-04-22 MED ORDER — METOCLOPRAMIDE HCL 10 MG PO TABS
5.0000 mg | ORAL_TABLET | Freq: Three times a day (TID) | ORAL | Status: DC | PRN
  Administered 2015-04-22 – 2015-04-23 (×2): 10 mg via ORAL
  Filled 2015-04-22 (×2): qty 1

## 2015-04-22 MED ORDER — MIDAZOLAM HCL 5 MG/5ML IJ SOLN
INTRAMUSCULAR | Status: DC | PRN
  Administered 2015-04-22 (×2): 1 mg via INTRAVENOUS
  Administered 2015-04-22: 2 mg via INTRAVENOUS

## 2015-04-22 MED ORDER — KETAMINE HCL 10 MG/ML IJ SOLN
INTRAMUSCULAR | Status: DC | PRN
  Administered 2015-04-22: 10 mg via INTRAVENOUS
  Administered 2015-04-22: 20 mg via INTRAVENOUS
  Administered 2015-04-22: 10 mg via INTRAVENOUS

## 2015-04-22 MED ORDER — PROPOFOL 10 MG/ML IV BOLUS
INTRAVENOUS | Status: DC | PRN
  Administered 2015-04-22 (×2): 20 mg via INTRAVENOUS
  Administered 2015-04-22: 30 mg via INTRAVENOUS

## 2015-04-22 MED ORDER — SODIUM CHLORIDE 0.45 % IV SOLN
INTRAVENOUS | Status: DC
  Administered 2015-04-22 – 2015-04-23 (×3): via INTRAVENOUS

## 2015-04-22 MED ORDER — OXYCODONE HCL 5 MG PO TABS
5.0000 mg | ORAL_TABLET | ORAL | Status: DC | PRN
  Administered 2015-04-22: 10 mg via ORAL
  Administered 2015-04-22: 5 mg via ORAL
  Administered 2015-04-22 – 2015-04-25 (×15): 10 mg via ORAL
  Filled 2015-04-22 (×15): qty 2
  Filled 2015-04-22: qty 1
  Filled 2015-04-22: qty 2

## 2015-04-22 MED ORDER — QUETIAPINE FUMARATE 25 MG PO TABS
50.0000 mg | ORAL_TABLET | Freq: Four times a day (QID) | ORAL | Status: DC | PRN
  Administered 2015-04-22: 50 mg via ORAL
  Filled 2015-04-22: qty 2

## 2015-04-22 MED ORDER — SENNA 8.6 MG PO TABS
1.0000 | ORAL_TABLET | Freq: Two times a day (BID) | ORAL | Status: DC
  Administered 2015-04-22 – 2015-04-25 (×7): 8.6 mg via ORAL
  Filled 2015-04-22 (×7): qty 1

## 2015-04-22 MED ORDER — ALUM & MAG HYDROXIDE-SIMETH 200-200-20 MG/5ML PO SUSP
30.0000 mL | ORAL | Status: DC | PRN
  Administered 2015-04-23: 30 mL via ORAL
  Filled 2015-04-22: qty 30

## 2015-04-22 MED ORDER — QUETIAPINE FUMARATE ER 200 MG PO TB24
200.0000 mg | ORAL_TABLET | Freq: Every day | ORAL | Status: DC
  Filled 2015-04-22: qty 1

## 2015-04-22 MED ORDER — SODIUM CHLORIDE 0.9 % IV SOLN
INTRAVENOUS | Status: DC | PRN
  Administered 2015-04-22: 60 mL

## 2015-04-22 MED ORDER — ENOXAPARIN SODIUM 30 MG/0.3ML ~~LOC~~ SOLN: 30.0000 mg | SUBCUTANEOUS | Status: DC

## 2015-04-22 MED ORDER — VANCOMYCIN HCL 10 G IV SOLR
1500.0000 mg | INTRAVENOUS | Status: AC
  Administered 2015-04-22: 1500 mg via INTRAVENOUS
  Administered 2015-04-22: 1000 mg via INTRAVENOUS
  Filled 2015-04-22: qty 1500

## 2015-04-22 MED ORDER — METOCLOPRAMIDE HCL 5 MG/ML IJ SOLN: 5.0000 mg | Freq: Three times a day (TID) | INTRAMUSCULAR | Status: DC | PRN

## 2015-04-22 MED ORDER — METHOCARBAMOL 500 MG PO TABS
500.0000 mg | ORAL_TABLET | Freq: Four times a day (QID) | ORAL | Status: DC | PRN
  Administered 2015-04-22: 500 mg via ORAL
  Filled 2015-04-22: qty 1

## 2015-04-22 MED ORDER — BENZTROPINE MESYLATE 0.5 MG PO TABS
0.5000 mg | ORAL_TABLET | Freq: Two times a day (BID) | ORAL | Status: DC | PRN
  Filled 2015-04-22: qty 1

## 2015-04-22 MED ORDER — TRANEXAMIC ACID 1000 MG/10ML IV SOLN
1000.0000 mg | INTRAVENOUS | Status: AC
  Administered 2015-04-22: 1000 mg via INTRAVENOUS
  Filled 2015-04-22: qty 10

## 2015-04-22 MED ORDER — FENTANYL CITRATE (PF) 100 MCG/2ML IJ SOLN: 25.0000 ug | INTRAMUSCULAR | Status: DC | PRN

## 2015-04-22 MED ORDER — ACETAMINOPHEN 650 MG RE SUPP: 650.0000 mg | Freq: Four times a day (QID) | RECTAL | Status: DC | PRN

## 2015-04-22 MED ORDER — MORPHINE SULFATE (PF) 4 MG/ML IV SOLN
INTRAVENOUS | Status: AC
  Filled 2015-04-22: qty 1

## 2015-04-22 MED ORDER — ACETAMINOPHEN 325 MG PO TABS
650.0000 mg | ORAL_TABLET | Freq: Four times a day (QID) | ORAL | Status: DC | PRN
  Administered 2015-04-23 – 2015-04-24 (×3): 650 mg via ORAL
  Filled 2015-04-22 (×3): qty 2

## 2015-04-22 MED ORDER — DIPHENHYDRAMINE HCL 12.5 MG/5ML PO ELIX: 12.5000 mg | ORAL_SOLUTION | ORAL | Status: DC | PRN

## 2015-04-22 MED ORDER — SODIUM CHLORIDE 0.9 % IV SOLN
Freq: Once | INTRAVENOUS | Status: AC
  Administered 2015-04-22: 18:00:00 via INTRAVENOUS

## 2015-04-22 MED ORDER — ONDANSETRON HCL 4 MG/2ML IJ SOLN: 4.0000 mg | Freq: Once | INTRAMUSCULAR | Status: DC | PRN

## 2015-04-22 MED ORDER — BISACODYL 10 MG RE SUPP: 10.0000 mg | Freq: Every day | RECTAL | Status: DC | PRN

## 2015-04-22 MED ORDER — PROPOFOL 500 MG/50ML IV EMUL
INTRAVENOUS | Status: DC | PRN
  Administered 2015-04-22: 100 ug/kg/min via INTRAVENOUS
  Administered 2015-04-22: 50 ug/kg/min via INTRAVENOUS

## 2015-04-22 MED ORDER — PREGABALIN 75 MG PO CAPS
75.0000 mg | ORAL_CAPSULE | Freq: Two times a day (BID) | ORAL | Status: DC
  Administered 2015-04-22 – 2015-04-25 (×7): 75 mg via ORAL
  Filled 2015-04-22 (×7): qty 1

## 2015-04-22 MED ORDER — CELECOXIB 200 MG PO CAPS
200.0000 mg | ORAL_CAPSULE | Freq: Once | ORAL | Status: AC
  Administered 2015-04-22: 200 mg via ORAL

## 2015-04-22 MED ORDER — CELECOXIB 200 MG PO CAPS
ORAL_CAPSULE | ORAL | Status: AC
  Filled 2015-04-22: qty 1

## 2015-04-22 SURGICAL SUPPLY — 52 items
AUTOTRANSFUS HAS 1/8 (MISCELLANEOUS) ×3
BLADE DEBAKEY 8.0 (BLADE) ×2 IMPLANT
BLADE DEBAKEY 8.0MM (BLADE) ×1
BLADE SAGITTAL WIDE XTHICK NO (BLADE) ×3 IMPLANT
CANISTER SUCT 1200ML W/VALVE (MISCELLANEOUS) ×3 IMPLANT
CANISTER SUCT 3000ML (MISCELLANEOUS) ×3 IMPLANT
CAP KNEE TOTAL 3 SIGMA ×3 IMPLANT
CATH TRAY METER 16FR LF (MISCELLANEOUS) ×3 IMPLANT
CHLORAPREP W/TINT 26ML (MISCELLANEOUS) ×9 IMPLANT
COOLER POLAR GLACIER W/PUMP (MISCELLANEOUS) ×3 IMPLANT
DECANTER SPIKE VIAL GLASS SM (MISCELLANEOUS) ×3 IMPLANT
DRAPE INCISE IOBAN 66X60 STRL (DRAPES) ×3 IMPLANT
DRAPE SHEET LG 3/4 BI-LAMINATE (DRAPES) ×3 IMPLANT
DRSG AQUACEL AG ADV 3.5X10 (GAUZE/BANDAGES/DRESSINGS) IMPLANT
DRSG AQUACEL AG ADV 3.5X14 (GAUZE/BANDAGES/DRESSINGS) ×3 IMPLANT
ELECT REM PT RETURN 9FT ADLT (ELECTROSURGICAL) ×3
ELECTRODE REM PT RTRN 9FT ADLT (ELECTROSURGICAL) ×1 IMPLANT
GAUZE PETRO XEROFOAM 1X8 (MISCELLANEOUS) ×3 IMPLANT
GLOVE INDICATOR 8.0 STRL GRN (GLOVE) ×6 IMPLANT
GLOVE SURG ORTHO 8.0 STRL STRW (GLOVE) ×3 IMPLANT
GLOVE SURG ORTHO 8.5 STRL (GLOVE) ×6 IMPLANT
GOWN STRL REUS W/ TWL LRG LVL3 (GOWN DISPOSABLE) ×3 IMPLANT
GOWN STRL REUS W/TWL LRG LVL3 (GOWN DISPOSABLE) ×6
HANDPIECE SUCTION TUBG SURGILV (MISCELLANEOUS) ×3 IMPLANT
IMMBOLIZER KNEE 19 BLUE UNIV (SOFTGOODS) ×3 IMPLANT
KIT RM TURNOVER STRD PROC AR (KITS) ×3 IMPLANT
NEEDLE 18GX1X1/2 (RX/OR ONLY) (NEEDLE) ×3 IMPLANT
NEEDLE SPNL 18GX3.5 QUINCKE PK (NEEDLE) ×3 IMPLANT
NEEDLE SPNL 22GX3.5 QUINCKE BK (NEEDLE) ×3 IMPLANT
NS IRRIG 1000ML POUR BTL (IV SOLUTION) ×3 IMPLANT
PACK TOTAL KNEE (MISCELLANEOUS) ×3 IMPLANT
PAD WRAPON POLAR KNEE (MISCELLANEOUS) ×1 IMPLANT
SOL .9 NS 3000ML IRR  AL (IV SOLUTION) ×2
SOL .9 NS 3000ML IRR UROMATIC (IV SOLUTION) ×1 IMPLANT
SPONGE LAP 18X18 5 PK (GAUZE/BANDAGES/DRESSINGS) ×3 IMPLANT
STAPLER SKIN PROX 35W (STAPLE) ×3 IMPLANT
SUCTION FRAZIER HANDLE 10FR (MISCELLANEOUS) ×2
SUCTION TUBE FRAZIER 10FR DISP (MISCELLANEOUS) ×1 IMPLANT
SUT BONE WAX W31G (SUTURE) ×3 IMPLANT
SUT DVC 2 QUILL PDO  T11 36X36 (SUTURE) ×2
SUT DVC 2 QUILL PDO T11 36X36 (SUTURE) ×1 IMPLANT
SUT ORTHOCORD OS-6 NDL 36 (SUTURE) ×3 IMPLANT
SUT QUILL PDO 0 36 36 VIOLET (SUTURE) ×3 IMPLANT
SUT VIC AB 0 CT1 36 (SUTURE) ×3 IMPLANT
SUT VIC AB 2-0 CT1 (SUTURE) ×3 IMPLANT
SYR 20CC LL (SYRINGE) ×6 IMPLANT
SYR 50ML LL SCALE MARK (SYRINGE) ×6 IMPLANT
SYSTEM AUTOTRANSFUS DUAL TROCR (MISCELLANEOUS) ×1 IMPLANT
TAPE MICROFOAM 4IN (TAPE) ×3 IMPLANT
TOWER CARTRIDGE SMART MIX (DISPOSABLE) ×3 IMPLANT
TUBE SUCT KAM VAC (TUBING) ×3 IMPLANT
WRAPON POLAR PAD KNEE (MISCELLANEOUS) ×3

## 2015-04-22 NOTE — Transfer of Care (Signed)
Immediate Anesthesia Transfer of Care Note  Patient: [name]  Procedure(s) Performed: Procedure(s): TOTAL KNEE ARTHROPLASTY (Right)  Patient Location: PACU  Anesthesia Type:Spinal  Level of Consciousness: sedated  Airway & Oxygen Therapy: Patient Spontanous Breathing and Patient connected to face mask oxygen  Post-op Assessment: Report given to RN and Post -op Vital signs reviewed and stable  Post vital signs: Reviewed and stable  Last Vitals:  Filed Vitals:   04/22/15 0607  BP: 118/79  Pulse: 88  Temp: 36.6 C  Resp: 16    Complications: No apparent anesthesia complications

## 2015-04-22 NOTE — Anesthesia Preprocedure Evaluation (Signed)
Anesthesia Evaluation  Patient identified by MRN, date of birth, ID band Patient awake    Reviewed: Allergy & Precautions, NPO status , Patient's Chart, lab work & pertinent test results  History of Anesthesia Complications Negative for: history of anesthetic complications  Airway Mallampati: II       Dental   Pulmonary neg pulmonary ROS, former smoker,           Cardiovascular hypertension, Pt. on medications      Neuro/Psych Anxiety Depression Bipolar Disorder negative neurological ROS     GI/Hepatic GERD  Medicated and Controlled,(+) Hepatitis -, C  Endo/Other  negative endocrine ROS  Renal/GU Renal disease (stones)     Musculoskeletal   Abdominal   Peds  Hematology negative hematology ROS (+)   Anesthesia Other Findings   Reproductive/Obstetrics                             Anesthesia Physical Anesthesia Plan  ASA: II  Anesthesia Plan: Spinal   Post-op Pain Management:    Induction:   Airway Management Planned:   Additional Equipment:   Intra-op Plan:   Post-operative Plan:   Informed Consent: I have reviewed the patients History and Physical, chart, labs and discussed the procedure including the risks, benefits and alternatives for the proposed anesthesia with the patient or authorized representative who has indicated his/her understanding and acceptance.     Plan Discussed with:   Anesthesia Plan Comments:         Anesthesia Quick Evaluation

## 2015-04-22 NOTE — Anesthesia Procedure Notes (Signed)
Spinal Patient location during procedure: OR Start time: 04/22/2015 7:45 AM End time: 04/22/2015 7:52 AM Preanesthetic Checklist Completed: patient identified, site marked, surgical consent, pre-op evaluation, timeout performed, IV checked, risks and benefits discussed and monitors and equipment checked Spinal Block Patient position: sitting Prep: Betadine Patient monitoring: heart rate, continuous pulse ox, blood pressure and cardiac monitor Approach: midline Location: L4-5 Injection technique: single-shot Needle Needle type: Whitacre and Introducer  Needle gauge: 24 G Needle length: 9 cm Needle insertion depth: 8 cm Additional Notes Negative paresthesia. Negative blood return. Positive free-flowing CSF. Expiration date of kit checked and confirmed. Patient tolerated procedure well, without complications.

## 2015-04-22 NOTE — Progress Notes (Signed)
Patient educated on blood transfusion/autologious. Verbalized understanding. Pre vital signs taken within normal range. Polar care in place, patient sitting in recliner chair.

## 2015-04-22 NOTE — H&P (Signed)
THE PATIENT WAS SEEN PRIOR TO SURGERY TODAY.  HISTORY, ALLERGIES, HOME MEDICATIONS AND OPERATIVE PROCEDURE WERE REVIEWED. RISKS AND BENEFITS OF SURGERY DISCUSSED WITH PATIENT AGAIN.  NO CHANGES FROM INITIAL HISTORY AND PHYSICAL NOTED.    

## 2015-04-22 NOTE — Op Note (Signed)
DATE OF SURGERY:  04/22/2015 TIME: 10:01 AM  PATIENT NAME:  [name]   AGE: 55 y.o.    PRE-OPERATIVE DIAGNOSIS:  M17.0 Bilateral primary osteoarthritis of right knee  POST-OPERATIVE DIAGNOSIS:  Same  PROCEDURE:  Procedure(s): TOTAL KNEE ARTHROPLASTY RIGHT: POROCOAT ROTATING PLATFORM DEPUY LCS   SURGEON:  Sydnee Lamour E, MD   ASSISTANT:   OPERATIVE IMPLANTS: Depuy LCS Femur/Patella size Large, Tibia size #4 keeled,  Rotating platform polyethylene size 12.82mm    Total tourniquet time was 97 min minutes.  PREOPERATIVE INDICATIONS:  [name] is a 55 y.o. year old male with end stage bone on bone degenerative arthritis of the knee who failed conservative treatment, including injections, antiinflammatories, activity modification, and assistive devices, and had significant impairment of their activities of daily living, and elected for Total Knee Arthroplasty. Because of his young age I elected to use a porocoated prosthesis.  The risks, benefits, and alternatives were discussed at length including but not limited to the risks of infection, bleeding, nerve injury, stiffness, blood clots, the need for revision surgery, cardiopulmonary complications, among others, and they were willing to proceed.  OPERATIVE FINDINGS AND UNIQUE ASPECTS OF THE CASE:  Severe osteoarthritis and spurring  OPERATIVE DESCRIPTION:   The patient was brought to the operative room and placed in a supine position. Spinal anesthesia was administered. IV Vancomycin antibiotics were given. The lower extremity was prepped and draped in the usual sterile fashion. Time out was performed. The leg was elevated and exsanguinated and the tourniquet was inflated to 300 mmHg  An anterior midline incision was made.  Anterior quadriceps tendon splitting approach was performed. The patella was everted and osteophytes were removed. The anterior horn of the medial and lateral meniscus was removed.  Then the extramedullary  tibial cutting jig was utilized making the appropriate cut using the anterior tibial crest as a reference building in appropriate posterior slope. Care was taken during the cut to protect the medial and collateral ligaments. The proximal tibia was removed along with the posterior horns of the menisci. The PCL was sacrificed.  The distal femur was sized as a large. Medial release was carried out. The anterior femoral cutting guide was aligned and centering hole made. The rotation guide was inserted and the anterior cutting guide pinned in place, and was in excellent alignment. The posterior femoral cuts were made. A 12.5 mm flexion gap was established. The distal femoral cutting guide was introduced at 4 of valgus. This was pinned and the distal femoral cut made. A 12.5 mm extension gap was established and was stable. The finishing guide was applied and finishing cuts made. The Mchale retractor was inserted and the keeled #4 tibial trial was pinned in place. Centering hole was made and the keel inserted. The femoral component was inserted along with a 12.5 mm  insert and the knee articulated.  Extension and flexion showed good stability throughout. The patella was then sized and cut made for the patellar component. Centering holes were made. The trial was inserted and the knee articulated nicely with no need for lateral release. The trials were all removed and the knee thoroughly irrigated with pulsed lavage. Exparil was injected. The knee was dried.. The porocoated #4 keeled tibial component, large femoral component and patellar components were all impacted with good fixation.  Further irrigation was carried out. Bone wax was applied to all raw bony surfaces. Autovac drains were inserted.  . The capsule was closed with #2 Quill suture, and the  subjacent tissues were closed with 0 Quill suture. The skin was closed with staples.Sponge and needle counts were correct.  Aquacel dressing with TENS pads and a dry  sterile dressing were applied. Polar Care and knee immobilizer were applied. Tourniquet was deflated with excellent return of blood flow to foot. Patient was transferred to a hospital bed and taken to the recovery room in good condition.  Valinda HoarHoward E Brant Peets, MD

## 2015-04-22 NOTE — Evaluation (Signed)
Physical Therapy Evaluation [name] MRN: [id number] DOB: [birth year] Today's Date: 04/22/2015   History of Present Illness  Patient is a 55 y/o male s/p R TKR on 04/22/2015.   Clinical Impression  Patient is eager to begin PT as he wants to become more mobile. He demonstrates excellent bed mobility skills and appropriate transfer skills in this session. He is lacking some knee extension at this time, however no deficits in ambulation noted with knee immobilizer donned. Patient appeared to complete PWB during ambulation appropriately with cuing from therapist. He was able to complete 10 SLRs on RLE in this session, though some quad lag noted. Patient appears to be progressing well with therapy towards his mobility goals.     Follow Up Recommendations Home health PT    Equipment Recommendations  Rolling walker with 5" wheels    Recommendations for Other Services       Precautions / Restrictions Precautions Precautions: Knee;Fall Required Braces or Orthoses: Knee Immobilizer - Right Knee Immobilizer - Right: On when out of bed or walking Restrictions Weight Bearing Restrictions: Yes RLE Weight Bearing: Partial weight bearing      Mobility  Bed Mobility Overal bed mobility: Modified Independent             General bed mobility comments: Patient requires use of hand rails to complete transfer, however no other deficits noted.   Transfers Overall transfer level: Needs assistance Equipment used: Rolling walker (2 wheeled) Transfers: Sit to/from Stand Sit to Stand: Supervision         General transfer comment: No deficits noted in transfer with appropriate cuing for hand placement and sequencing.   Ambulation/Gait Ambulation/Gait assistance: Supervision Ambulation Distance (Feet): 5 Feet Assistive device: Rolling walker (2 wheeled) Gait Pattern/deviations: Step-through pattern;Decreased step length - right;Decreased step length - left;Narrow  base of support   Gait velocity interpretation: Below normal speed for age/gender General Gait Details: Patient is able to take steps with appropriate weight acceptance on to RLE as well as appropriate sequencing. He reports he is in some pain, thus limiting ambulation. No buckling or loss of balance noted.   Stairs            Wheelchair Mobility    Modified Rankin (Stroke Patients Only)       Balance Overall balance assessment: Needs assistance Sitting-balance support: No upper extremity supported Sitting balance-Leahy Scale: Good     Standing balance support: Bilateral upper extremity supported Standing balance-Leahy Scale: Good                               Pertinent Vitals/Pain Pain Assessment:  (Patient reports his knee is starting to "wake up" but agrees to participate in therapy to see what he can do)    Home Living Family/patient expects to be discharged to:: Private residence Living Arrangements: Alone Available Help at Discharge: Friend(s) Type of Home: House Home Access: Level entry     Home Layout: One level Home Equipment: Environmental consultant - 2 wheels      Prior Function Level of Independence: Independent         Comments: Patient denies falls, reports he did use a cane once for pain control      Hand Dominance        Extremity/Trunk Assessment   Upper Extremity Assessment: Overall WFL for tasks assessed           Lower Extremity Assessment: RLE deficits/detail  RLE Deficits / Details: Able to complete 10 SLRs on RLE with very minimal assistance.        Communication   Communication: No difficulties  Cognition Arousal/Alertness: Awake/alert Behavior During Therapy: WFL for tasks assessed/performed Overall Cognitive Status: Within Functional Limits for tasks assessed                      General Comments General comments (skin integrity, edema, etc.): Drains, tubes in place appropriately.     Exercises Total Joint  Exercises Ankle Circles/Pumps: AROM;Both;10 reps Hip ABduction/ADduction: AROM;Right;AAROM;Left;10 reps Straight Leg Raises: AROM;Left;AAROM;Right;10 reps Long Arc Quad: AROM;Left;10 reps Knee Flexion: AROM;Left;AAROM;Right;10 reps Goniometric ROM: 7-68      Assessment/Plan    PT Assessment Patient needs continued PT services  PT Diagnosis Difficulty walking;Generalized weakness   PT Problem List Decreased strength;Decreased knowledge of use of DME;Decreased range of motion;Decreased activity tolerance;Decreased balance;Decreased mobility  PT Treatment Interventions DME instruction;Gait training;Stair training;Manual techniques;Therapeutic activities;Therapeutic exercise;Balance training   PT Goals (Current goals can be found in the Care Plan section) Acute Rehab PT Goals Patient Stated Goal: To return home safely  PT Goal Formulation: With patient Time For Goal Achievement: 05/06/15 Potential to Achieve Goals: Good    Frequency BID   Barriers to discharge        Co-evaluation               End of Session Equipment Utilized During Treatment: Gait belt Activity Tolerance: Patient tolerated treatment well Patient left: in chair;with call bell/phone within reach;with chair alarm set Nurse Communication: Mobility status         Time: 1343-1405 PT Time Calculation (min) (ACUTE ONLY): 22 min   Charges:   PT Evaluation $PT Eval Moderate Complexity: 1 Procedure PT Treatments $Therapeutic Exercise: 8-22 mins   PT G Codes:       Kerin RansomPatrick A McNamara, PT, DPT    04/22/2015, 5:25 PM

## 2015-04-23 LAB — BASIC METABOLIC PANEL
Anion gap: 5 (ref 5–15)
BUN: 10 mg/dL (ref 6–20)
CALCIUM: 8.9 mg/dL (ref 8.9–10.3)
CO2: 22 mmol/L (ref 22–32)
CREATININE: 0.8 mg/dL (ref 0.61–1.24)
Chloride: 110 mmol/L (ref 101–111)
GFR calc Af Amer: >60 mL/min (ref >60)
GLUCOSE: 124 mg/dL — AB (ref 65–99)
Potassium: 4.2 mmol/L (ref 3.5–5.1)
SODIUM: 137 mmol/L (ref 135–145)

## 2015-04-23 LAB — CBC
HCT: 33.5 % — ABNORMAL LOW (ref 40.0–52.0)
Hemoglobin: 11.6 g/dL — ABNORMAL LOW (ref 13.0–18.0)
MCH: 31.6 pg (ref 26.0–34.0)
MCHC: 34.5 g/dL (ref 32.0–36.0)
MCV: 91.6 fL (ref 80.0–100.0)
PLATELETS: 177 10*3/uL (ref 150–440)
RBC: 3.66 MIL/uL — AB (ref 4.40–5.90)
RDW: 14.3 % (ref 11.5–14.5)
WBC: 6.4 10*3/uL (ref 3.8–10.6)

## 2015-04-23 MED ORDER — ENOXAPARIN SODIUM 40 MG/0.4ML ~~LOC~~ SOLN
40.0000 mg | SUBCUTANEOUS | Status: DC
  Administered 2015-04-23 – 2015-04-25 (×3): 40 mg via SUBCUTANEOUS
  Filled 2015-04-23 (×3): qty 0.4

## 2015-04-23 NOTE — Progress Notes (Signed)
Physical Therapy Treatment [name] MRN: [id number] DOB: [birth year] Today's Date: 04/23/2015    History of Present Illness Patient is a 55 y/o male with PMH of CKD, bipolar disorder, anxiety, PTSD and hepatitis C.  Pt is s/p R TKR on 04/22/2015.     PT Comments    Pt was min assist with bed mobility, CGA for sit to stand and CGA for 100 feet ambulation (transfers and ambulation with RW).  Pt performed sitting EOB and in bed LE AROM and AAROM exercises (L LE AROM, R LE AAROM).  Pt appeared to have increased ease with mobility compared to last session.  Next session, PT will attempt increased ambulation distance.     Follow Up Recommendations  Home health PT     Equipment Recommendations  Rolling walker with 5" wheels    Recommendations for Other Services       Precautions / Restrictions Precautions Precautions: Knee Required Braces or Orthoses: Knee Immobilizer - Right Knee Immobilizer - Right: On when out of bed or walking Restrictions Weight Bearing Restrictions: Yes RLE Weight Bearing: Partial weight bearing RLE Partial Weight Bearing Percentage or Pounds: < 50%     Mobility  Bed Mobility Overal bed mobility: Needs Assistance Bed Mobility: Sit to Sidelying;Rolling Rolling: Min guard       Sit to sidelying: Min assist General bed mobility comments: With assist for RLE secondary to pain  Transfers Overall transfer level: Needs assistance Equipment used: Rolling walker (2 wheeled) Transfers: Sit to/from Stand Sit to Stand: Min guard         General transfer comment: pt maintained PWB status with VC's throughout transfers  Ambulation/Gait Ambulation/Gait assistance: Min guard Ambulation Distance (Feet): 100 Feet Assistive device: Rolling walker (2 wheeled) Gait Pattern/deviations: Step-to pattern;Decreased step length - right;Decreased stance time - right Gait velocity: decreased    General Gait Details: pt maintained PWB status  with VC's throughout ambulation    Stairs            Wheelchair Mobility    Modified Rankin (Stroke Patients Only)       Balance Overall balance assessment: Needs assistance Sitting-balance support: Feet supported Sitting balance-Leahy Scale: Normal     Standing balance support: Bilateral upper extremity supported (RW ) Standing balance-Leahy Scale: Fair                      Cognition Arousal/Alertness: Awake/alert Behavior During Therapy: WFL for tasks assessed/performed Overall Cognitive Status: Within Functional Limits for tasks assessed                      Exercises Total Joint Exercises Ankle Circles/Pumps: AROM;Both;10 reps Hip ABduction/ADduction: AROM;AAROM;10 reps;Both Straight Leg Raises: AROM;AAROM;Both;10 reps Long Arc Quad: AROM;Left;10 reps Knee Flexion: AROM;Both;10 reps;AAROM L LE: AROM, R LE: AAROM Exercises performed sitting EOB and supine in bed   General Comments   Nursing was contacted and cleared pt for physical therapy.  Pt was agreeable and tolerated session well.       Pertinent Vitals/Pain Pain Assessment: 0-10 Pain Score: 8  Pain Location: R knee  Pain Descriptors / Indicators: Aching;Constant;Grimacing;Operative site guarding Pain Intervention(s): Limited activity within patient's tolerance;Monitored during session;Premedicated before session;Repositioned  See flow sheet for vitals.     Home Living                      Prior Function  PT Goals (current goals can now be found in the care plan section) Acute Rehab PT Goals Patient Stated Goal: To get back to normal with less pain. PT Goal Formulation: With patient Time For Goal Achievement: 05/06/15 Potential to Achieve Goals: Good Progress towards PT goals: Progressing toward goals    Frequency  BID    PT Plan Current plan remains appropriate    Co-evaluation             End of Session Equipment Utilized During Treatment:  Gait belt Activity Tolerance: Patient tolerated treatment well Patient left: in bed;with call bell/phone within reach;with bed alarm set;with SCD's reapplied (B towel rolls under heels, polar care activated )     Time: [id number] PT Time Calculation (min) (ACUTE ONLY): 23 min  Charges:                      G Codes:      Lyndel SafeGarrett Sherrita Riederer, SPT  Lyndel SafeGarrett Tamecka Milham 04/23/2015, 4:06 PM

## 2015-04-23 NOTE — Progress Notes (Signed)
Subjective: 1 Day Post-Op Procedure(s) (LRB): TOTAL KNEE ARTHROPLASTY (Right)    Patient reports pain as moderate.  Some pain overnight.  He states that he walked to the door with physical therapy this morning.  Drain is removed.  Hemoglobin is stable.  Objective:   VITALS:   Filed Vitals:   04/23/15 0809 04/23/15 1140  BP: 146/87   Pulse: 90 105  Temp: 98.7 F (37.1 C)   Resp: 22     Neurologically intact Neurovascular intact Sensation intact distally Intact pulses distally Dorsiflexion/Plantar flexion intact Dressing dry  LABS  Recent Labs  04/22/15 1243 04/23/15 0549  HGB 12.3* 11.6*  HCT 36.0* 33.5*  WBC 5.7 6.4  PLT 167 177     Recent Labs  04/22/15 1243 04/23/15 0549  NA  --  137  K  --  4.2  BUN  --  10  CREATININE 0.93 0.80  GLUCOSE  --  124*    No results for input(s): LABPT, INR in the last 72 hours.   Assessment/Plan: 1 Day Post-Op Procedure(s) (LRB): TOTAL KNEE ARTHROPLASTY (Right)   Advance diet Up with therapy D/C IV fluids Plan for discharge tomorrow

## 2015-04-23 NOTE — Care Management Note (Signed)
Case Management Note  Patient Details  Name: [name] MRN: [id number] Date of Birth: [birth year]  Subjective/Objective:       55yo [name] received [name]  Right TKA on 04/22/15 by Dr Hyacinth MeekerMiller. Resides at home with [name] friend who will assist with ADLs and transportation needs. Pharmacy=[facility] in [place]. PCP= Dr Wonda AmisGuterrez. Has [name] RW at home and does not want [name] BSC. No home oxygen and no home health services. Per Dr Hyacinth MeekerMiller he will go directly to OP-PT after discharge. Case management will follow for discharge planning.             Action/Plan:   Expected Discharge Date:                  Expected Discharge Plan:     In-House Referral:     Discharge planning Services     Post Acute Care Choice:    Choice offered to:     DME Arranged:    DME Agency:     HH Arranged:    HH Agency:     Status of Service:     Medicare Important Message Given:    Date Medicare IM Given:    Medicare IM give by:    Date Additional Medicare IM Given:    Additional Medicare Important Message give by:     If discussed at Long Length of Stay Meetings, dates discussed:    Additional Comments:  Victor Frerichs A, RN 04/23/2015, 10:26 AM

## 2015-04-23 NOTE — Anesthesia Postprocedure Evaluation (Signed)
Anesthesia Post Note  Patient: Ivar DrapeJames C Mancusi  Procedure(s) Performed: Procedure(s) (LRB): TOTAL KNEE ARTHROPLASTY (Right)  Patient location during evaluation: Nursing Unit Anesthesia Type: Spinal Level of consciousness: awake and alert and oriented Pain management: satisfactory to patient Vital Signs Assessment: post-procedure vital signs reviewed and stable Respiratory status: spontaneous breathing and respiratory function stable Cardiovascular status: stable Postop Assessment: no headache, no backache, spinal receding, patient able to bend at knees, no signs of nausea or vomiting and adequate PO intake Anesthetic complications: no    Last Vitals:  Filed Vitals:   04/23/15 0809 04/23/15 1140  BP: 146/87   Pulse: 90 105  Temp: 37.1 C   Resp: 22     Last Pain:  Filed Vitals:   04/23/15 1141  PainSc: 7                  Clydene PughBeane, Kambria Grima D

## 2015-04-23 NOTE — Evaluation (Signed)
Occupational Therapy Evaluation [name] MRN: [id number] DOB: 1960/03/19 Today's Date: 04/23/2015    History of Present Illness Patient is a 55 y/o male s/p R TKR on 04/22/2015.    Clinical Impression  Pt is 55 year old male s/p Right TKR.  Pt was independent in all ADLs prior to surgery and is eager to return to his PLOF.  Pt currently requires min assist for LB ADLs while in seated position due to pain and limited AROM of Right knee.  Pt would benefit from instruction in dressing techniques with or without assistive devices for dressing and bathing skills.  Pt would also benefit from recommendations for home modifications to increase safety and prevent falls. Will continue to assess for OT [facility] needs as pt. progresses in therapy.      Follow Up Recommendations  No OT follow up    Equipment Recommendations       Recommendations for Other Services PT consult     Precautions / Restrictions Precautions Precautions: Knee Required Braces or Orthoses: Knee Immobilizer - Right Knee Immobilizer - Right: On when out of bed or walking Restrictions Weight Bearing Restrictions: Yes RLE Weight Bearing: Partial weight bearing      Mobility Bed Mobility Overal bed mobility: Needs Assistance Bed Mobility: Supine to Sit     Supine to sit: Min guard     General bed mobility comments: With assist for RLE secondary to pain  Transfers                      Balance Overall balance assessment: Needs assistance   Sitting balance-Leahy Scale: Good                                      ADL Overall ADL's : Needs assistance/impaired Eating/Feeding: Independent   Grooming: Independent           Upper Body Dressing : Minimal assistance (with A/E use, for donning/doffing socks, and pants)                           Vision     Perception     Praxis      Pertinent Vitals/Pain Pain Assessment: 0-10 Pain Score: 8  Pain  Location: 8.5/10 pain upon arrival. Pt. reports having had an episode of increased pain when toileting earlier in A.M.      Hand Dominance     Extremity/Trunk Assessment Upper Extremity Assessment Upper Extremity Assessment: Overall WFL for tasks assessed           Communication Communication Communication: No difficulties   Cognition Arousal/Alertness: Awake/alert Behavior During Therapy: WFL for tasks assessed/performed Overall Cognitive Status: Within Functional Limits for tasks assessed                     General Comments       Exercises       Shoulder Instructions      Home Living Family/patient expects to be discharged to:: Private residence Living Arrangements: Alone Available Help at Discharge: Friend(s) Type of Home: House Home Access: Level entry     Home Layout: One level     Bathroom Shower/Tub: Tub/shower unit;Curtain Shower/tub characteristics: Engineer, building services: Standard     Home Equipment: Environmental consultant - 2 wheels          Prior Functioning/Environment  Level of Independence: Independent             OT Diagnosis:     OT Problem List:     OT Treatment/Interventions:      OT Goals(Current goals can be found in the care plan section) Acute Rehab OT Goals Patient Stated Goal: To get back to [place] with less pain. OT Goal Formulation: With patient Time For Goal Achievement: 05/07/15 Potential to Achieve Goals: Good  OT Frequency:     Barriers to D/C:            Co-evaluation              End of Session    Activity Tolerance: Patient tolerated treatment well;Patient limited by pain Patient left: in bed;with call bell/phone within reach   Time: 0905-0935 OT Time Calculation (min): 30 min Charges:  OT General Charges $OT Visit: 1 Procedure OT Evaluation $OT Eval Low Complexity: 1 Procedure OT Treatments $Self Care/Home Management : 8-22 mins G-Codes:    Olegario MessierElaine Zyaira Vejar, MS, OTR/L Olegario MessierElaine  Leea Rambeau 04/23/2015, 10:19 AM

## 2015-04-23 NOTE — Progress Notes (Signed)
Clinical Social Worker (CSW) received SNF consult. PT is recommending home health. RN Case Manager is aware of above. Please reconsult if future social work needs arise. CSW signing off.   Wesleigh Markovic Morgan, LCSW [phone] 

## 2015-04-23 NOTE — Progress Notes (Signed)
Physical Therapy Treatment Patient Details Name: Ivar DrapeJames C Bulson MRN: [id number] DOB: Jun 23, 1960 Today's Date: 04/23/2015    History of Present Illness Patient is a 55 y/o male with PMH of CKD, bipolar disorder, anxiety, PTSD and hepatitis C.  Pt is s/p R TKR on 04/22/2015.     PT Comments    Pt performed LE AROM and AAROM exercises in bed.  Pt was observed to require assistance with R Knee exercises (AAROM) and was AROM for L LE exercises.  Pt was min assist with bed mobility.  Pt was CGA for three sets of sit to stand with RW.  Pt was CGA for 20 feet for ambulation with RW.  Pt reported that he was having increased pain in the R knee compared to yesterday; however, pt reported that it was not affecting his mobility at that time.  Next session PT will attempt increased ambulation distance.        Follow Up Recommendations  Home health PT     Equipment Recommendations  Rolling walker with 5" wheels    Recommendations for Other Services       Precautions / Restrictions Precautions Precautions: Knee Required Braces or Orthoses: Knee Immobilizer - Right Knee Immobilizer - Right: On when out of bed or walking Restrictions Weight Bearing Restrictions: Yes RLE Weight Bearing: Partial weight bearing RLE Partial Weight Bearing Percentage or Pounds: < 50%     Mobility  Bed Mobility Overal bed mobility: Needs Assistance Bed Mobility: Supine to Sit     Supine to sit: Min assist     General bed mobility comments: With assist for RLE secondary to pain  Transfers Overall transfer level: Needs assistance Equipment used: Rolling walker (2 wheeled) Transfers: Sit to/from Stand (3 sets) Sit to Stand: Min guard         General transfer comment: pt maintained PWB status with VC's throughout transfers  Ambulation/Gait Ambulation/Gait assistance: Min assist Ambulation Distance (Feet): 20 Feet Assistive device: Rolling walker (2 wheeled) Gait Pattern/deviations: Step-to  pattern;Decreased stance time - right;Decreased step length - right Gait velocity: decreased    General Gait Details: pt maintained PWB status with VC's throughout ambulation    Stairs            Wheelchair Mobility    Modified Rankin (Stroke Patients Only)       Balance Overall balance assessment: Needs assistance Sitting-balance support: Feet supported Sitting balance-Leahy Scale: Good     Standing balance support: Bilateral upper extremity supported (RW ) Standing balance-Leahy Scale: Fair                      Cognition Arousal/Alertness: Awake/alert Behavior During Therapy: WFL for tasks assessed/performed Overall Cognitive Status: Within Functional Limits for tasks assessed                      Exercises Total Joint Exercises Ankle Circles/Pumps: AROM;Both;10 reps Hip ABduction/ADduction: AROM;AAROM;10 reps;Both (L AROM, R AAROM ) Straight Leg Raises: AROM;AAROM;Both;10 reps (L AROM, R AAROM ) Knee Flexion: AROM;Both;10 reps;AAROM (L AROM, R AAROM ) Goniometric ROM: R Knee extension (assessed supine in bed): -11 degrees; R Knee flexion (assessed sitting EOB): 76 degrees All exercises performed in bed   General Comments   Nursing was contacted and cleared pt for physical therapy.  Pt was agreeable and tolerated session well.       Pertinent Vitals/Pain Pain Assessment: No/denies pain Pain Score: 7  Pain Location: R knee Pain Descriptors /  Indicators: Aching;Constant;Grimacing;Operative site guarding Pain Intervention(s): Limited activity within patient's tolerance;Monitored during session;Premedicated before session;Repositioned    Home Living Family/patient expects to be discharged to:: Private residence Living Arrangements: Alone Available Help at Discharge: Friend(s) Type of Home: House Home Access: Level entry   Home Layout: One level Home Equipment: Environmental consultant - 2 wheels      Prior Function Level of Independence: Independent           PT Goals (current goals can now be found in the care plan section) Acute Rehab PT Goals Patient Stated Goal: To get back to normal with less pain. PT Goal Formulation: With patient Time For Goal Achievement: 05/06/15 Potential to Achieve Goals: Good Progress towards PT goals: Progressing toward goals    Frequency  BID    PT Plan Current plan remains appropriate    Co-evaluation             End of Session Equipment Utilized During Treatment: Gait belt Activity Tolerance: Patient tolerated treatment well Patient left: in chair;with call bell/phone within reach;with chair alarm set     Time: 4540-9811 PT Time Calculation (min) (ACUTE ONLY): 27 min  Charges:                       G Codes:      Lyndel Safe, SPT Lyndel Safe 04/23/2015, 11:47 AM

## 2015-04-23 NOTE — Progress Notes (Signed)
Enoxaparin   Patient qualifies for Enoxaparin 40 mg SQ daily based on CrCl >30 ml/min per policy. Will change to Enoxaparin 40 mg SQ daily.  H/h: stable   Demetrius Charityeldrin D. Shed, PharmD, BCPS

## 2015-04-24 DIAGNOSIS — R338 Other retention of urine: Secondary | ICD-10-CM

## 2015-04-24 LAB — CBC
HCT: 33.3 % — ABNORMAL LOW (ref 40.0–52.0)
Hemoglobin: 11.7 g/dL — ABNORMAL LOW (ref 13.0–18.0)
MCH: 32 pg (ref 26.0–34.0)
MCHC: 35.1 g/dL (ref 32.0–36.0)
MCV: 91.2 fL (ref 80.0–100.0)
Platelets: 186 10*3/uL (ref 150–440)
RBC: 3.65 MIL/uL — ABNORMAL LOW (ref 4.40–5.90)
RDW: 14.1 % (ref 11.5–14.5)
WBC: 8.3 10*3/uL (ref 3.8–10.6)

## 2015-04-24 MED ORDER — TAMSULOSIN HCL 0.4 MG PO CAPS: 0.4000 mg | ORAL_CAPSULE | Freq: Every day | ORAL | Status: DC

## 2015-04-24 MED ORDER — TAMSULOSIN HCL 0.4 MG PO CAPS
0.4000 mg | ORAL_CAPSULE | Freq: Every day | ORAL | Status: DC
  Administered 2015-04-24 – 2015-04-25 (×2): 0.4 mg via ORAL
  Filled 2015-04-24 (×2): qty 1

## 2015-04-24 NOTE — Progress Notes (Signed)
Subjective: 2 Days Post-Op Procedure(s) (LRB): TOTAL KNEE ARTHROPLASTY (Right)    Patient reports pain as moderate. Only able to void intermittently. Catheter replaced after several in and out caths done. Urology consult ordered. Hopefully may discharge tomorrow. Walking with PT but not moving the knee as well as he should. He was encouraged to work on aggressive range of motion.  Objective:   VITALS:   Filed Vitals:   04/24/15 0950 04/24/15 1312  BP:    Pulse: 137 123  Temp:    Resp:      Neurologically intact ABD soft Neurovascular intact Sensation intact distally Intact pulses distally Dorsiflexion/Plantar flexion intact  LABS  Recent Labs  04/22/15 1243 04/23/15 0549 04/24/15 0528  HGB 12.3* 11.6* 11.7*  HCT 36.0* 33.5* 33.3*  WBC 5.7 6.4 8.3  PLT 167 177 186     Recent Labs  04/22/15 1243 04/23/15 0549  NA  --  137  K  --  4.2  BUN  --  10  CREATININE 0.93 0.80  GLUCOSE  --  124*    No results for input(s): LABPT, INR in the last 72 hours.   Assessment/Plan: 2 Days Post-Op Procedure(s) (LRB): TOTAL KNEE ARTHROPLASTY (Right)   Advance diet Up with therapy Continue foley due to acute urinary retention

## 2015-04-24 NOTE — Consult Note (Signed)
I have been asked to see the patient by Dr. Deeann Saint, MD, for evaluation and management of urinary retention.  History of present illness: 55M s/p right knee replacement on 04/22/15.  Patient had spinal anesthesia.   Patient has some obstructive urinary symptoms baseline.  He has a weak stream with difficulty starting his stream at times. He gets up twice a night.  He feels as if he empties his bladder well most times.  He has no history of previous urinary retention.  He has no history of urinary tract infections.  He has had kidney stones in the past. Patient unable to void this AM and a catheter was placed.  He tells me that he had ~700-900 cc when the catheter was placed today and that he had significant pain prior to placing the catheter.  He has been started on flomax.   Review of systems: A 12 point comprehensive review of systems was obtained and is negative unless otherwise stated in the history of present illness.  Patient Active Problem List   Diagnosis Date Noted  . 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   Current Outpatient Prescriptions on File Prior to Encounter  Medication Sig Dispense Refill  . divalproex (DEPAKOTE ER) 500 MG 24 hr tablet Take 500 mg by mouth 2 (two) times daily.     . SEROQUEL XR 400 MG 24 hr tablet Take 1 capsule by mouth at bedtime.  2  . meloxicam (MOBIC) 15 MG tablet Take 15 mg by mouth daily.      Past Medical History  Diagnosis Date  . Arthritis     knees and hands  . Chronic kidney disease     kidney stones  . Depression   . Restless leg syndrome   . Bipolar disorder (HCC)   . Anxiety   . PTSD (post-traumatic stress disorder)   . GERD (gastroesophageal reflux disease)   . Syncope   . Hepatitis     HEP "C"    Past Surgical History  Procedure Laterality Date  . Cystoscopy with ureteroscopy and stent placement    . Knee arthroscopy Right 06/25/2014     Procedure: ARTHROSCOPY KNEE;  Surgeon: Deeann Saint, MD;  Location: [facility] ORS;  Service: Orthopedics;  Laterality: Right;  partial arthroscopic medial menisectomy  . Joint replacement    . Total knee arthroplasty Right 04/22/2015    Procedure: TOTAL KNEE ARTHROPLASTY;  Surgeon: Deeann Saint, MD;  Location: [facility] ORS;  Service: Orthopedics;  Laterality: Right;    Social History  Substance Use Topics  . Smoking status: Former Smoker -- 1.00 packs/day    Types: Cigarettes    Quit date: 05/16/1984  . Smokeless tobacco: Never Used  . Alcohol Use: No    History reviewed. No pertinent family history.  PE: Filed Vitals:   04/24/15 0415 04/24/15 0725 04/24/15 0950 04/24/15 1312  BP: 131/80 124/75    Pulse: 124 113 137 123  Temp: 98.2 F (36.8 C) 98.5 F (36.9 C)    TempSrc: Oral Oral    Resp: 20 16    Height:      Weight:      SpO2: 95% 98% 94% 94%   Patient appears to be in no acute distress  patient is alert and oriented x3 Atraumatic normocephalic head No cervical or supraclavicular lymphadenopathy appreciated No increased work of breathing, no audible wheezes/rhonchi Regular sinus rhythm/rate Abdomen is soft, nontender, nondistended, no CVA or  suprapubic tenderness Right knee wrapped in knee immobilzer, left leg without significant swelling. Grossly neurologically intact No identifiable skin lesions   Recent Labs  04/22/15 1243 04/23/15 0549 04/24/15 0528  WBC 5.7 6.4 8.3  HGB 12.3* 11.6* 11.7*  HCT 36.0* 33.5* 33.3*    Recent Labs  04/22/15 1243 04/23/15 0549  NA  --  137  K  --  4.2  CL  --  110  CO2  --  22  GLUCOSE  --  124*  BUN  --  10  CREATININE 0.93 0.80  CALCIUM  --  8.9   No results for input(s): LABPT, INR in the last 72 hours. No results for input(s): LABURIN in the last 72 hours. Results for orders placed or performed during the hospital encounter of 04/15/15  Surgical pcr screen     Status: None   Collection Time: 04/15/15  1:23 PM   Result Value Ref Range Status   MRSA, PCR NEGATIVE NEGATIVE Final   Staphylococcus aureus NEGATIVE NEGATIVE Final    Comment:        The Xpert SA Assay (FDA approved for NASAL specimens in patients over 53 years of age), is one component of a comprehensive surveillance program.  Test performance has been validated by [facility] for patients greater than or equal to 53 year old. It is not intended to diagnose infection nor to guide or monitor treatment.     Imaging: none  Imp: Post-op urinary retention - multifactorial, likely contributors include baseline prostatic enlargement, narcotics, immobility and even residual nerve block from spinal.  Doubt that infection is contributing.  Recommendations: D/c with foley catheter.  Continue flomax.  F/u with Urology Monday for voiding trial.    Thank you for involving me in this patient's care. Please page with any further questions or concerns. Berniece SalinesHERRICK, Oakley Kossman W

## 2015-04-24 NOTE — Discharge Instructions (Signed)
Foley Catheter Care °A soft, flexible tube (Foley catheter) may have been placed in your bladder to drain urine and fluid. Follow these instructions: °Taking Care of the Catheter °· Keep the area where the catheter leaves your body clean.  °· Attach the catheter to the leg so there is no tension on the catheter.  °· Keep the drainage bag below the level of the bladder, but keep it OFF the floor.  °· Do not take long soaking baths. Your caregiver will give instructions about showering.  °· Wash your hands before touching ANYTHING related to the catheter or bag.  °· Using mild soap and warm water on a washcloth:  °· Clean the area closest to the catheter insertion site using a circular motion around the catheter.  °· Clean the catheter itself by wiping AWAY from the insertion site for several inches down the tube.  °· NEVER wipe upward as this could sweep bacteria up into the urethra (tube in your body that normally drains the bladder) and cause infection.  °· Place a small amount of sterile lubricant at the tip of the penis where the catheter is entering.  °Taking Care of the Drainage Bags °· Two drainage bags may be taken home: a large overnight drainage bag, and a smaller leg bag which fits underneath clothing.  °· It is okay to wear the overnight bag at any time, but NEVER wear the smaller leg bag at night.  °· Keep the drainage bag well below the level of your bladder. This prevents backflow of urine into the bladder and allows the urine to drain freely.  °· Anchor the tubing to your leg to prevent pulling or tension on the catheter. Use tape or a leg strap provided by the hospital.  °· Empty the drainage bag when it is 1/2 to 3/4 full. Wash your hands before and after touching the bag.  °· Periodically check the tubing for kinks to make sure there is no pressure on the tubing which could restrict the flow of urine.  °Changing the Drainage Bags °· Cleanse both ends of the clean bag with alcohol before changing.    °· Pinch off the rubber catheter to avoid urine spillage during the disconnection.  °· Disconnect the dirty bag and connect the clean one.  °· Empty the dirty bag carefully to avoid a urine spill.  °· Attach the new bag to the leg with tape or a leg strap.  °Cleaning the Drainage Bags °· Whenever a drainage bag is disconnected, it must be cleaned quickly so it is ready for the next use.  °· Wash the bag in warm, soapy water.  °· Rinse the bag thoroughly with warm water.  °· Soak the bag for 30 minutes in a solution of white vinegar and water (1 cup vinegar to 1 quart warm water).  °· Rinse with warm water.  °SEEK MEDICAL CARE IF:  °· You have chills or night sweats.  °· You are leaking around your catheter or have problems with your catheter. It is not uncommon to have sporadic leakage around your catheter as a result of bladder spasms. If the leakage stops, there is not much need for concern. If you are uncertain, call your caregiver.  °· You develop side effects that you think are coming from your medicines.  °SEEK IMMEDIATE MEDICAL CARE IF:  °· You are suddenly unable to urinate. Check to see if there are any kinks in the drainage tubing that may cause this. If   you cannot find any kinks, call your caregiver immediately. This is an emergency.  °· You develop shortness of breath or chest pains.  °· Bleeding persists or clots develop in your urine.  °· You have a fever.  °· You develop pain in your back or over your lower belly (abdomen).  °· You develop pain or swelling in your legs.  °· Any problems you are having get worse rather than better.  °MAKE SURE YOU:  °· Understand these instructions.  °· Will watch your condition.  °· Will get help right away if you are not doing well or get worse.  ° °

## 2015-04-24 NOTE — Progress Notes (Signed)
Physical Therapy Treatment Patient Details Name: Victor DrapeJames C Guillotte MRN: [id number] DOB: 10-16-1960 Today's Date: 04/24/2015    History of Present Illness Patient is a 55 y/o male with PMH of CKD, bipolar disorder, anxiety, PTSD and hepatitis C.  Pt is s/p R TKR on 04/22/2015.     PT Comments    Pt reported at the beginning of the session that his knee was more painful today and that he had not urinated since last evening.  Pt was min assist with bed mobility and CGA for sit to stand with RW.  Pt was CGA for ambulation of 60 feet.  Pt's vitals during session were: HR:  Beginning of session: 115 bpm, after ambulation: 137 bpm, end of session: 118 bpm.  Pt did not ambulate as far compared to last session due to pain and increased HR.  Nursing was notified post session of increased HR, increased pain and decreased ambulation distance.  Next session PT will attempt increased ambulation distance.     Follow Up Recommendations  Home health PT     Equipment Recommendations  Rolling walker with 5" wheels    Recommendations for Other Services       Precautions / Restrictions Precautions Precautions: Knee Required Braces or Orthoses: Knee Immobilizer - Right Knee Immobilizer - Right: On when out of bed or walking Restrictions Weight Bearing Restrictions: Yes RLE Weight Bearing: Partial weight bearing RLE Partial Weight Bearing Percentage or Pounds: < 50%     Mobility  Bed Mobility Overal bed mobility: Needs Assistance Bed Mobility: Sit to Sidelying;Rolling;Sidelying to Sit Rolling: Min guard Sidelying to sit: Min assist     Sit to sidelying: Min assist General bed mobility comments: With assist for RLE secondary to pain  Transfers Overall transfer level: Needs assistance Equipment used: Rolling walker (2 wheeled) Transfers: Sit to/from Stand Sit to Stand: Min guard         General transfer comment: pt maintained PWB status with VC's throughout  transfers  Ambulation/Gait Ambulation/Gait assistance: Min guard Ambulation Distance (Feet): 60 Feet Assistive device: Rolling walker (2 wheeled) Gait Pattern/deviations: Step-to pattern;Decreased step length - right;Decreased stance time - right Gait velocity: decreased    General Gait Details: pt maintained PWB status with VC's throughout ambulation    Stairs            Wheelchair Mobility    Modified Rankin (Stroke Patients Only)       Balance Overall balance assessment: Needs assistance Sitting-balance support: Feet supported Sitting balance-Leahy Scale: Normal     Standing balance support: Bilateral upper extremity supported (RW ) Standing balance-Leahy Scale: Good                      Cognition Arousal/Alertness: Awake/alert Behavior During Therapy: WFL for tasks assessed/performed Overall Cognitive Status: Within Functional Limits for tasks assessed                      Exercises Total Joint Exercises Goniometric ROM: R knee extension (assessed supine in bed): -14 degrees; R knee flexion (assessed sitting EOB): 71 degrees    General Comments   Nursing was contacted and cleared pt for physical therapy.  Pt was agreeable and session was modified due to increased Hr and pain.       Pertinent Vitals/Pain Pain Assessment: 0-10 Pain Score: 9  Pain Location: R knee  Pain Descriptors / Indicators: Aching;Constant;Grimacing;Operative site guarding Pain Intervention(s): Limited activity within patient's tolerance;Monitored during session;Premedicated before session;Repositioned  See flow sheet for vitals.     Home Living                      Prior Function            PT Goals (current goals can now be found in the care plan section) Acute Rehab PT Goals Patient Stated Goal: To get back to normal with less pain. PT Goal Formulation: With patient Time For Goal Achievement: 05/06/15 Potential to Achieve Goals: Good Progress  towards PT goals: Progressing toward goals    Frequency  BID    PT Plan Current plan remains appropriate    Co-evaluation             End of Session Equipment Utilized During Treatment: Gait belt Activity Tolerance: Patient limited by pain Patient left: in bed;with call bell/phone within reach;with bed alarm set;with SCD's reapplied (B towel rolls under heels, polar care activated )     Time: 6578-4696 PT Time Calculation (min) (ACUTE ONLY): 24 min  Charges:                       G Codes:      Lyndel Safe, SPT  Lyndel Safe 04/24/2015, 11:24 AM

## 2015-04-24 NOTE — Progress Notes (Signed)
Physical Therapy Treatment [name] MRN: [id number] DOB: [birth year] Today's Date: 04/24/2015    History of Present Illness Patient is a 55 y/o male with PMH of CKD, bipolar disorder, anxiety, PTSD and hepatitis C.  Pt is s/p R TKR on 04/22/2015.     PT Comments    Pt was placed on a foley catheter this morning following last PT session.  Pt reported he felt better compared to last PT session.  Pt performed AAROM heel slides in bed.  Pt was independent with bed mobility and modified independent with sit to stand with RW.  Pt was CGA with ambulation for 200 feet.  Pt appeared to have increased mobility compared to last session.  Pt's HR during mobility was decreased compared to last session; however, pt's resting HR was comparable to last session.  Pt's HR: before session (resting):  115 bpm, during ambulation: 122 bpm, end of session: 116 bpm.  Next session PT will increase ambulation distance.   Follow Up Recommendations  Home health PT     Equipment Recommendations  Rolling walker with 5" wheels    Recommendations for Other Services       Precautions / Restrictions Precautions Precautions: Knee Required Braces or Orthoses: Knee Immobilizer - Right Knee Immobilizer - Right: On when out of bed or walking Restrictions Weight Bearing Restrictions: Yes RLE Weight Bearing: Partial weight bearing RLE Partial Weight Bearing Percentage or Pounds: < 50%     Mobility  Bed Mobility Overal bed mobility: Independent   General bed mobility comments: With assist for RLE secondary to pain  Transfers Overall transfer level: Modified independent (RW ) Equipment used: Rolling walker (2 wheeled)         General transfer comment: pt maintained PWB status with VC's throughout transfers  Ambulation/Gait Ambulation/Gait assistance: Min guard Ambulation Distance (Feet): 200 Feet Assistive device: Rolling walker (2 wheeled) Gait Pattern/deviations: Decreased step  length - right;Decreased stance time - right;Step-to pattern Gait velocity: decreased    General Gait Details: pt maintained PWB status with VC's throughout ambulation    Stairs            Wheelchair Mobility    Modified Rankin (Stroke Patients Only)       Balance Overall balance assessment: Needs assistance Sitting-balance support: Feet supported Sitting balance-Leahy Scale: Normal     Standing balance support: Bilateral upper extremity supported (RW ) Standing balance-Leahy Scale: Good                      Cognition Arousal/Alertness: Awake/alert Behavior During Therapy: WFL for tasks assessed/performed Overall Cognitive Status: Within Functional Limits for tasks assessed                      Exercises Total Joint Exercises Heel Slides: AAROM;Right;10 reps     General Comments   Nursing was contacted and cleared pt for physical therapy.  Pt was agreeable and tolerated session well.  OT was present at the end of the session.       Pertinent Vitals/Pain Pain Assessment: 0-10 Pain Score: 7  Pain Location: R knee Pain Descriptors / Indicators: Aching;Contraction;Grimacing;Operative site guarding Pain Intervention(s): Limited activity within patient's tolerance;Monitored during session;Premedicated before session;Repositioned  See flow sheet for vitals.     Home Living                      Prior Function  PT Goals (current goals can now be found in the care plan section) Acute Rehab PT Goals Patient Stated Goal: To get back to normal with less pain. PT Goal Formulation: With patient Time For Goal Achievement: 05/06/15 Potential to Achieve Goals: Good Progress towards PT goals: Progressing toward goals    Frequency  BID    PT Plan Current plan remains appropriate    Co-evaluation             End of Session Equipment Utilized During Treatment: Gait belt Activity Tolerance: Patient tolerated treatment  well Patient left: in chair;with call bell/phone within reach;with chair alarm set (OT in room at the end of the session )     Time: 1310-1333 PT Time Calculation (min) (ACUTE ONLY): 23 min  Charges:                      G Codes:      Lyndel Safe, SPT  Lyndel Safe 04/24/2015, 1:40 PM

## 2015-04-24 NOTE — Care Management Important Message (Signed)
Important Message  Patient Details  Name: [name] MRN: [id number] Date of Birth: 07-09-1960   Medicare Important Message Given:  Yes    Lannie Yusuf A, RN 04/24/2015, 6:53 AM

## 2015-04-24 NOTE — Progress Notes (Signed)
Occupational Therapy Treatment [name] MRN: [id number] DOB: [birth year] Today's Date: 04/24/2015    History of present illness Patient is a 55 y/o male with PMH of CKD, bipolar disorder, anxiety, PTSD and hepatitis C.  Pt is s/p R TKR on 04/22/2015.    OT comments: Pt. Has progressed with A/E training for LE ADLs, requiring Min Guard. Pt. Does require assist for the RLE secondary to the knee immobilzer. Pt. has a new foley catheter in place. Pt. HR 116. Pt. Continues to plan on returning home upon discharge. Pt. Continues to benefit from skilled OT services for review of A/E use for LE ADLs, and functional mobility during ADLs.   Follow Up Recommendations  No OT follow up    Equipment Recommendations       Recommendations for Other Services PT consult    Precautions / Restrictions Precautions Precautions: Knee Required Braces or Orthoses: Knee Immobilizer - Right Knee Immobilizer - Right: On when out of bed or walking Restrictions Weight Bearing Restrictions: Yes RLE Weight Bearing: Partial weight bearing RLE Partial Weight Bearing Percentage or Pounds: < 50%        Mobility Bed Mobility Overal bed mobility: Independent                Transfers Overall transfer level: Needs assistance Equipment used: Rolling walker (2 wheeled) Transfers: Sit to/from Stand Sit to Stand: Min guard         General transfer comment: pt maintained PWB status with VC's throughout transfers    Balance Overall balance assessment: Needs assistance Sitting-balance support: Feet supported Sitting balance-Leahy Scale: Good     Standing balance support: Bilateral upper extremity supported (RW ) Standing balance-Leahy Scale: Good                     ADL Overall ADL's : Needs assistance/impaired Eating/Feeding: Independent   Grooming: Independent           Upper Body Dressing : Min guard (Assist to support RLE when donning socks =, and  pants.)                            Vision                     Perception     Praxis      Cognition   Behavior During Therapy: WFL for tasks assessed/performed Overall Cognitive Status: Within Functional Limits for tasks assessed                       Extremity/Trunk Assessment               Exercises Total Joint Exercises Ankle Circles/Pumps: AROM;Both;10 reps Heel Slides: AAROM;Right;10 reps   Shoulder Instructions       General Comments      Pertinent Vitals/ Pain       Pain Assessment: 0-10 Pain Score: 7  Pain Location: R knee Pain Descriptors / Indicators: Aching;Contraction;Grimacing;Operative site guarding Pain Intervention(s): Limited activity within patient's tolerance;Monitored during session;Premedicated before session;Repositioned  Home Living                                          Prior Functioning/Environment              Frequency Min 1X/week  Progress Toward Goals  OT Goals(current goals can now be found in the care plan section)     Acute Rehab OT Goals Patient Stated Goal: To go back home OT Goal Formulation: With patient Time For Goal Achievement: 05/07/15 Potential to Achieve Goals: Good  Plan Discharge plan remains appropriate    Co-evaluation                 End of Session Equipment Utilized During Treatment: Gait belt   Activity Tolerance Patient tolerated treatment well   Patient Left in chair;with call bell/phone within reach;with chair alarm set   Nurse Communication          Time: 1335-1350 OT Time Calculation (min): 15 min  Charges: OT General Charges $OT Visit: 1 Procedure OT Treatments $Self Care/Home Management : 8-22 mins   Victor Messier, MS, OTR/L  [name] 04/24/2015, 3:35 PM

## 2015-04-25 LAB — CBC
HEMATOCRIT: 32.1 % — AB (ref 40.0–52.0)
HEMOGLOBIN: 11.1 g/dL — AB (ref 13.0–18.0)
MCH: 32.1 pg (ref 26.0–34.0)
MCHC: 34.7 g/dL (ref 32.0–36.0)
MCV: 92.5 fL (ref 80.0–100.0)
Platelets: 182 10*3/uL (ref 150–440)
RBC: 3.47 MIL/uL — ABNORMAL LOW (ref 4.40–5.90)
RDW: 14.1 % (ref 11.5–14.5)
WBC: 6.1 10*3/uL (ref 3.8–10.6)

## 2015-04-25 MED ORDER — MELOXICAM 15 MG PO TABS: 15.0000 mg | ORAL_TABLET | Freq: Every day | ORAL | Status: DC

## 2015-04-25 MED ORDER — METHOCARBAMOL 500 MG PO TABS: 500.0000 mg | ORAL_TABLET | Freq: Four times a day (QID) | ORAL | Status: DC | PRN

## 2015-04-25 MED ORDER — BENZTROPINE MESYLATE 0.5 MG PO TABS: 0.5000 mg | ORAL_TABLET | Freq: Two times a day (BID) | ORAL | Status: DC | PRN

## 2015-04-25 MED ORDER — GABAPENTIN 400 MG PO CAPS: 400.0000 mg | ORAL_CAPSULE | Freq: Three times a day (TID) | ORAL | Status: DC

## 2015-04-25 MED ORDER — HYDROCODONE-ACETAMINOPHEN 7.5-325 MG PO TABS: 1.0000 | ORAL_TABLET | Freq: Four times a day (QID) | ORAL | Status: DC | PRN

## 2015-04-25 NOTE — Progress Notes (Signed)
Physical Therapy Treatment [name] MRN: [id number] DOB: Feb 15, 1960 Today's Date: 04/25/2015    History of Present Illness Patient is a 55 y/o male with PMH of CKD, bipolar disorder, anxiety, PTSD and hepatitis C.  Pt is s/p R TKR on 04/22/2015.     PT Comments    Pt continues to have a lot of pain with PT session and needs extra encouragement with exercises and especially ROM acts, but is able to achieve much closer to neutral extension and gets to 90 deg of flexion.  Pt able to circumambulate the nurses' station and though he is weak and pain limited has no safety issues.  Pt unable to lift leg against gravity (SLR or SAQ) but shows increased ability to engage quad with quad sets.  Follow Up Recommendations  Home health PT     Equipment Recommendations  Rolling walker with 5" wheels    Recommendations for Other Services       Precautions / Restrictions Precautions Precautions: Knee;Fall Required Braces or Orthoses: Knee Immobilizer - Right Knee Immobilizer - Right: On when out of bed or walking Restrictions Weight Bearing Restrictions: Yes RLE Weight Bearing: Partial weight bearing RLE Partial Weight Bearing Percentage or Pounds: < 50%     Mobility  Bed Mobility Overal bed mobility: Modified Independent Bed Mobility: Supine to Sit;Sit to Supine     Supine to sit: Min assist     General bed mobility comments: Pt able to use UEs to minimally assist R LE to EOB, KI donned  Transfers Overall transfer level: Modified independent Equipment used: Rolling walker (2 wheeled) Transfers: Sit to/from Stand Sit to Stand: Supervision         General transfer comment: Pt able to rise and maintain balance w/o use of the walker.  He shows good confidence with standing.  Ambulation/Gait Ambulation/Gait assistance: Supervision;Min guard Ambulation Distance (Feet): 250 Feet Assistive device: Rolling walker (2 wheeled)       General Gait Details:  Pt showed increased confidence and less fatigue with prolonged ambulation today.  He is inconsistent with ambulation but does not have any safety issues.  He needs cuing to even out step length, to maintain proper posture and use of the walker and to educate on PWBing and appropriate gait.    Stairs            Wheelchair Mobility    Modified Rankin (Stroke Patients Only)       Balance                                    Cognition Arousal/Alertness: Awake/alert Behavior During Therapy: WFL for tasks assessed/performed Overall Cognitive Status: Within Functional Limits for tasks assessed                      Exercises Total Joint Exercises Ankle Circles/Pumps: Strengthening;15 reps Quad Sets: Strengthening;15 reps Gluteal Sets: Strengthening;15 reps Short Arc Quad: AAROM;10 reps Heel Slides: AAROM;10 reps Hip ABduction/ADduction: AROM;10 reps;Strengthening Straight Leg Raises:  (pt still unable to do AROM) Knee Flexion: PROM;5 reps Goniometric ROM: 4-91    General Comments        Pertinent Vitals/Pain Pain Assessment: 0-10 Pain Score: 10-Worst pain ever Pain Location: r knee Pain Descriptors / Indicators: Aching Pain Intervention(s): Limited activity within patient's tolerance;Repositioned;Monitored during session;Patient requesting pain meds-RN notified    Home Living  Prior Function            PT Goals (current goals can now be found in the care plan section) Acute Rehab PT Goals Patient Stated Goal: To go back home Progress towards PT goals: Progressing toward goals    Frequency  BID    PT Plan Current plan remains appropriate    Co-evaluation             End of Session Equipment Utilized During Treatment: Gait belt;Right knee immobilizer Activity Tolerance: Patient tolerated treatment well Patient left: in chair;with call bell/phone within reach;with chair alarm set     Time:  [id number] PT Time Calculation (min) (ACUTE ONLY): 40 min  Charges:  $Gait Training: 8-22 mins $Therapeutic Exercise: 23-37 mins                    G Codes:     Loran SentersGalen Lilac Hoff, PT, DPT [phone]  Malachi ProGalen R Emaree Chiu 04/25/2015, 10:40 AM

## 2015-04-25 NOTE — Progress Notes (Signed)
Patient is agitated about leaving.  Per Dr Hyacinth MeekerMiller, D/C foley and discharge patient.

## 2015-04-25 NOTE — Progress Notes (Signed)
Occupational Therapy Treatment Patient Details Name: Victor DrapeJames C Saravia MRN: [id number] DOB: 1960/11/08 Today's Date: 04/25/2015    History of present illness Patient is a 55 y/o male with PMH of CKD, bipolar disorder, anxiety, PTSD and hepatitis C.  Pt is s/p R TKR on 04/22/2015.    OT comments  Patient has  Continued to make progress with self care tasks and transfers although complains of increased pain with tasks.  He just finished PT session prior to OT and reported pain from ROM exercises.  He is still required to wear KI and is PWB.  Seen for lower body dressing tasks with AE, sockaid, reacher.  Instructed on donning and doffing pants but is limited with catheter still in place.  He was able to ambulate to and from the bathrooom with min guard to supervision and complete toileting, including hygiene after a BM.  Nursing notified of increased pain.   Follow Up Recommendations  No OT follow up    Equipment Recommendations       Recommendations for Other Services      Precautions / Restrictions Precautions Precautions: Knee;Fall Required Braces or Orthoses: Knee Immobilizer - Right Knee Immobilizer - Right: On when out of bed or walking Restrictions Weight Bearing Restrictions: Yes RLE Weight Bearing: Partial weight bearing RLE Partial Weight Bearing Percentage or Pounds: < 50%        Mobility Bed Mobility                  Transfers Overall transfer level: Needs assistance Equipment used: Rolling walker (2 wheeled)   Sit to Stand: Supervision         General transfer comment: pt maintained PWB status with VC's throughout transfers    Balance                                   ADL Overall ADL's : Needs assistance/impaired Eating/Feeding: Independent   Grooming: Independent;Sitting           Upper Body Dressing : Modified independent   Lower Body Dressing: Set up;Min guard   Toilet Transfer: Modified Independent;Regular Toilet    Toileting- Clothing Manipulation and Hygiene: Supervision/safety         General ADL Comments: Patient with increased pain after PT session this am, willing to participate in tx.      Vision                     Perception     Praxis      Cognition   Behavior During Therapy: WFL for tasks assessed/performed Overall Cognitive Status: Within Functional Limits for tasks assessed                       Extremity/Trunk Assessment               Exercises     Shoulder Instructions       General Comments      Pertinent Vitals/ Pain       Pain Assessment: 0-10 Pain Score: 10-Worst pain ever Pain Location: r knee Pain Descriptors / Indicators: Aching Pain Intervention(s): Limited activity within patient's tolerance;Repositioned;Monitored during session;Patient requesting pain meds-RN notified  Home Living  Prior Functioning/Environment              Frequency Min 1X/week     Progress Toward Goals  OT Goals(current goals can now be found in the care plan section)  Progress towards OT goals: Progressing toward goals  Acute Rehab OT Goals Patient Stated Goal: To go back home OT Goal Formulation: With patient Time For Goal Achievement: 05/07/15 Potential to Achieve Goals: Good  Plan Discharge plan remains appropriate    Co-evaluation                 End of Session Equipment Utilized During Treatment: Gait belt   Activity Tolerance Patient tolerated treatment well   Patient Left in chair;with call bell/phone within reach;with chair alarm set   Nurse Communication     Amy T Arne Cleveland, OTR/L, CLT      Time: 1610-9604 OT Time Calculation (min): 20 min  Charges: OT General Charges $OT Visit: 1 Procedure OT Treatments $Self Care/Home Management : 8-22 mins  Lovett,Amy 04/25/2015, 9:54 AM

## 2015-04-25 NOTE — Discharge Summary (Signed)
Physician Discharge Summary  Patient ID: Victor DrapeJames C Bushway MRN: [id number] DOB/AGE: Aug 31, 1960 55 y.o.  Admit date: 04/22/2015 Discharge date: 04/25/2015  Admission Diagnoses:  Discharge Diagnoses:  Active Problems:   Total knee replacement status   Discharged Condition: good  Hospital Course: Patient underwent a Porocoated right total knee replacement on 04/22/2015.  He did well postoperatively for pain standpoint.  He participated in PT.  He had 90 of flexion by time of discharge.  He did have some urinary retention and a catheter had to be replaced.  Urology saw the patient and recommended discharging him with the catheter.  Patient refused to go home with the catheter, so it was removed.  He was placed on Flomax 0.4 mg daily.  He understands that he may need to go back to the emergency room if he has further  urinary retention.  He will be 50% weightbearing and return to the office in 5-6 days.  His dressings were changed and the wound was benign.  Consults: urology  Significant Diagnostic Studies: radiology: X-Ray: Satisfactory right total knee replacement  Treatments: IV hydration, antibiotics: vancomycin, analgesia: Vicodin, anticoagulation: LMW heparin and PT  Discharge Exam: Blood pressure 99/55, pulse 97, temperature 98.1 F (36.7 C), temperature source Oral, resp. rate 18, height 6' (1.829 m), weight 94.62 kg (208 lb 9.6 oz), SpO2 97 %. Extremities: Homans sign is negative, no sign of DVT and no edema, redness or tenderness in the calves or thighs  Disposition: 01-Home or Self Care  Discharge Instructions    Call MD for:  persistant nausea and vomiting    Complete by:  As directed      Call MD for:  redness, tenderness, or signs of infection (pain, swelling, redness, odor or green/yellow discharge around incision site)    Complete by:  As directed      Call MD for:  severe uncontrolled pain    Complete by:  As directed      Call MD for:  temperature >100.4    Complete by:   As directed      Diet - low sodium heart healthy    Complete by:  As directed      Discharge instructions    Complete by:  As directed   Bend knee every 2-3 hours on side of bed. 50% weightbearing on the leg. Return to clinic 5-6 days.     Increase activity slowly    Complete by:  As directed      Leave dressing on - Keep it clean, dry, and intact until clinic visit    Complete by:  As directed             Medication List    STOP taking these medications        aspirin EC 81 MG tablet      TAKE these medications        benztropine 0.5 MG tablet  Commonly known as:  COGENTIN  Take 1 tablet (0.5 mg total) by mouth 2 (two) times daily as needed for tremors.     divalproex 500 MG 24 hr tablet  Commonly known as:  DEPAKOTE ER  Take 500 mg by mouth 2 (two) times daily.     enalapril 10 MG tablet  Commonly known as:  VASOTEC  Take 10 mg by mouth daily.     FLUoxetine 20 MG capsule  Commonly known as:  PROZAC  Take 20 mg by mouth daily.     gabapentin 400 MG capsule  Commonly known as:  NEURONTIN  Take 1 capsule (400 mg total) by mouth 3 (three) times daily.     HYDROcodone-acetaminophen 7.5-325 MG tablet  Commonly known as:  NORCO  Take 1 tablet by mouth every 6 (six) hours as needed for moderate pain.     meloxicam 15 MG tablet  Commonly known as:  MOBIC  Take 15 mg by mouth daily.     meloxicam 15 MG tablet  Commonly known as:  MOBIC  Take 1 tablet (15 mg total) by mouth daily.     methocarbamol 500 MG tablet  Commonly known as:  ROBAXIN  Take 1 tablet (500 mg total) by mouth every 6 (six) hours as needed for muscle spasms.     QUEtiapine 50 MG tablet  Commonly known as:  SEROQUEL  Take 50 mg by mouth 4 (four) times daily as needed.     SEROQUEL XR 400 MG 24 hr tablet  Generic drug:  QUEtiapine  Take 1 capsule by mouth at bedtime.     tamsulosin 0.4 MG Caps capsule  Commonly known as:  FLOMAX  Take 1 capsule (0.4 mg total) by mouth daily.            Follow-up Information    Follow up with [facility]. Call on 04/27/2015.   Specialty:  Urology   Why:  for catheter removal   Contact information:   [address], [address] [place] [address] [phone]      Schedule an appointment as soon as possible for a visit with Valinda Hoar, MD.   Specialty:  Specialist   Why:  For wound re-check   Contact information:   [address] [place] [address] [phone]       Signed: Valinda Hoar 04/25/2015, 3:21 PM

## 2015-04-25 NOTE — Care Management Note (Signed)
Case Management Note  Patient Details  Name: Ivar DrapeJames C Hildebrant MRN: [id number] Date of Birth: 02-21-1960  Subjective/Objective:      Mr [name] has a RW at home. He was discharged home today by Dr Hyacinth MeekerMiller and understands to follow [facility] per Dr Garen LahMillers instructions.               Action/Plan:   Expected Discharge Date:                  Expected Discharge Plan:     In-House Referral:     Discharge planning Services     Post Acute Care Choice:    Choice offered to:     DME Arranged:    DME Agency:     HH Arranged:    HH Agency:     Status of Service:     Medicare Important Message Given:  Yes Date Medicare IM Given:    Medicare IM give by:    Date Additional Medicare IM Given:    Additional Medicare Important Message give by:     If discussed at Long Length of Stay Meetings, dates discussed:    Additional Comments:  Dalayza Zambrana A, RN 04/25/2015, 3:18 PM

## 2015-04-25 NOTE — Progress Notes (Signed)
Subjective: 3 Days Post-Op Procedure(s) (LRB): TOTAL KNEE ARTHROPLASTY (Right)    Patient reports pain as mild.Refuses to go home with catheter.  Will place on Flomax and he knows he may need to go to ER if he cannot urinate.  RTC 5-6 days Objective:   VITALS:   Filed Vitals:   04/25/15 0438 04/25/15 0742  BP: 104/68 103/69  Pulse: 117 125  Temp: 98.1 F (36.7 C) 98.4 F (36.9 C)  Resp: 16 18    Neurologically intact Neurovascular intact Sensation intact distally Intact pulses distally Dorsiflexion/Plantar flexion intact Compartment soft  LABS  Recent Labs  04/23/15 0549 04/24/15 0528 04/25/15 0425  HGB 11.6* 11.7* 11.1*  HCT 33.5* 33.3* 32.1*  WBC 6.4 8.3 6.1  PLT 177 186 182     Recent Labs  04/23/15 0549  NA 137  K 4.2  BUN 10  CREATININE 0.80  GLUCOSE 124*    No results for input(s): LABPT, INR in the last 72 hours.   Assessment/Plan: 3 Days Post-Op Procedure(s) (LRB): TOTAL KNEE ARTHROPLASTY (Right)   Discharge home with home health

## 2015-04-25 NOTE — Progress Notes (Signed)
Patient very agitated and is ready to be discharged. States he wants his catheter removed and to go home. Dr Hyacinth MeekerMiller paged and states he will be by in about 45 min to an hour, and that patient will still have to discharge with the foley catheter.

## 2015-04-25 NOTE — Progress Notes (Signed)
Discharge note: Dressing changed with aquacell, foley removed per order from Hyacinth MeekerMiller, MD.  Belongings and Rxs given to patient upon discharge. Escorted out by staff.

## 2015-04-27 ENCOUNTER — Telehealth: Payer: Self-pay

## 2015-04-27 NOTE — Telephone Encounter (Signed)
Spoke with patient this morning and he had his cath pulled yesterday. He has been able to void and is not having any problems. Sent message back to dr. Marlou PorchHerrick via email and let him know this.  Patient stated that he did not need any follow up care.   [name]

## 2015-04-27 NOTE — Telephone Encounter (Signed)
-----   Message from Crist FatBenjamin W Herrick, MD sent at 04/24/2015  3:13 PM EDT ----- Regarding: voiding trial Patient needs to be seen and given a voiding trial on Monday 04/27/15. Thanks, bh

## 2015-05-11 DIAGNOSIS — I319 Disease of pericardium, unspecified: Secondary | ICD-10-CM

## 2015-05-11 HISTORY — DX: Disease of pericardium, unspecified: I31.9

## 2015-05-12 ENCOUNTER — Emergency Department: Payer: Medicaid Other

## 2015-05-12 ENCOUNTER — Encounter: Payer: Self-pay | Admitting: Emergency Medicine

## 2015-05-12 ENCOUNTER — Inpatient Hospital Stay
Admission: EM | Admit: 2015-05-12 | Discharge: 2015-05-14 | DRG: 315 | Disposition: A | Discharge status: Home / Self Care | Payer: Medicaid Other | Attending: Internal Medicine | Admitting: Internal Medicine

## 2015-05-12 DIAGNOSIS — I129 Hypertensive chronic kidney disease with stage 1 through stage 4 chronic kidney disease, or unspecified chronic kidney disease: Secondary | ICD-10-CM | POA: Diagnosis present

## 2015-05-12 DIAGNOSIS — Z888 Allergy status to other drugs, medicaments and biological substances status: Secondary | ICD-10-CM | POA: Diagnosis not present

## 2015-05-12 DIAGNOSIS — Z87891 Personal history of nicotine dependence: Secondary | ICD-10-CM

## 2015-05-12 DIAGNOSIS — Z823 Family history of stroke: Secondary | ICD-10-CM | POA: Diagnosis not present

## 2015-05-12 DIAGNOSIS — B1920 Unspecified viral hepatitis C without hepatic coma: Secondary | ICD-10-CM | POA: Diagnosis present

## 2015-05-12 DIAGNOSIS — G2581 Restless legs syndrome: Secondary | ICD-10-CM | POA: Diagnosis present

## 2015-05-12 DIAGNOSIS — R6510 Systemic inflammatory response syndrome (SIRS) of non-infectious origin without acute organ dysfunction: Secondary | ICD-10-CM | POA: Diagnosis present

## 2015-05-12 DIAGNOSIS — T398X5A Adverse effect of other nonopioid analgesics and antipyretics, not elsewhere classified, initial encounter: Secondary | ICD-10-CM | POA: Diagnosis present

## 2015-05-12 DIAGNOSIS — Z8619 Personal history of other infectious and parasitic diseases: Secondary | ICD-10-CM

## 2015-05-12 DIAGNOSIS — N200 Calculus of kidney: Secondary | ICD-10-CM | POA: Diagnosis present

## 2015-05-12 DIAGNOSIS — R079 Chest pain, unspecified: Secondary | ICD-10-CM | POA: Diagnosis not present

## 2015-05-12 DIAGNOSIS — I313 Pericardial effusion (noninflammatory): Secondary | ICD-10-CM | POA: Diagnosis present

## 2015-05-12 DIAGNOSIS — N189 Chronic kidney disease, unspecified: Secondary | ICD-10-CM | POA: Diagnosis present

## 2015-05-12 DIAGNOSIS — Z96651 Presence of right artificial knee joint: Secondary | ICD-10-CM | POA: Diagnosis present

## 2015-05-12 DIAGNOSIS — I3139 Other pericardial effusion (noninflammatory): Secondary | ICD-10-CM

## 2015-05-12 DIAGNOSIS — Z9889 Other specified postprocedural states: Secondary | ICD-10-CM

## 2015-05-12 DIAGNOSIS — Z79899 Other long term (current) drug therapy: Secondary | ICD-10-CM

## 2015-05-12 DIAGNOSIS — Z87442 Personal history of urinary calculi: Secondary | ICD-10-CM | POA: Diagnosis not present

## 2015-05-12 DIAGNOSIS — R0600 Dyspnea, unspecified: Secondary | ICD-10-CM | POA: Diagnosis present

## 2015-05-12 DIAGNOSIS — R7989 Other specified abnormal findings of blood chemistry: Secondary | ICD-10-CM

## 2015-05-12 DIAGNOSIS — K219 Gastro-esophageal reflux disease without esophagitis: Secondary | ICD-10-CM | POA: Diagnosis present

## 2015-05-12 DIAGNOSIS — R778 Other specified abnormalities of plasma proteins: Secondary | ICD-10-CM

## 2015-05-12 DIAGNOSIS — L270 Generalized skin eruption due to drugs and medicaments taken internally: Secondary | ICD-10-CM | POA: Diagnosis present

## 2015-05-12 DIAGNOSIS — I319 Disease of pericardium, unspecified: Secondary | ICD-10-CM | POA: Diagnosis present

## 2015-05-12 DIAGNOSIS — M1990 Unspecified osteoarthritis, unspecified site: Secondary | ICD-10-CM | POA: Diagnosis present

## 2015-05-12 DIAGNOSIS — F319 Bipolar disorder, unspecified: Secondary | ICD-10-CM | POA: Diagnosis present

## 2015-05-12 LAB — BASIC METABOLIC PANEL
ANION GAP: 18 — AB (ref 5–15)
BUN: 19 mg/dL (ref 6–20)
CO2: 16 mmol/L — ABNORMAL LOW (ref 22–32)
CREATININE: 1.06 mg/dL (ref 0.61–1.24)
Calcium: 10.2 mg/dL (ref 8.9–10.3)
Chloride: 103 mmol/L (ref 101–111)
GFR calc Af Amer: >60 mL/min (ref >60)
GFR calc non Af Amer: >60 mL/min (ref >60)
GLUCOSE: 122 mg/dL — AB (ref 65–99)
POTASSIUM: 3.9 mmol/L (ref 3.5–5.1)
SODIUM: 137 mmol/L (ref 135–145)

## 2015-05-12 LAB — CBC
HCT: 32.3 % — ABNORMAL LOW (ref 40.0–52.0)
Hemoglobin: 11.1 g/dL — ABNORMAL LOW (ref 13.0–18.0)
MCH: 30 pg (ref 26.0–34.0)
MCHC: 34.4 g/dL (ref 32.0–36.0)
MCV: 87 fL (ref 80.0–100.0)
Platelets: 635 10*3/uL — ABNORMAL HIGH (ref 150–440)
RBC: 3.72 MIL/uL — ABNORMAL LOW (ref 4.40–5.90)
RDW: 15.2 % — AB (ref 11.5–14.5)
WBC: 15 10*3/uL — ABNORMAL HIGH (ref 3.8–10.6)

## 2015-05-12 LAB — HEPATIC FUNCTION PANEL
ALBUMIN: 4 g/dL (ref 3.5–5.0)
ALT: 16 U/L — ABNORMAL LOW (ref 17–63)
AST: 22 U/L (ref 15–41)
Alkaline Phosphatase: 82 U/L (ref 38–126)
BILIRUBIN TOTAL: 0.7 mg/dL (ref 0.3–1.2)
Bilirubin, Direct: <0.1 mg/dL — ABNORMAL LOW (ref 0.1–0.5)
TOTAL PROTEIN: 8.4 g/dL — AB (ref 6.5–8.1)

## 2015-05-12 LAB — TROPONIN I: TROPONIN I: 0.04 ng/mL — AB (ref <0.031)

## 2015-05-12 LAB — LIPASE, BLOOD: LIPASE: 24 U/L (ref 11–51)

## 2015-05-12 MED ORDER — MORPHINE SULFATE (PF) 4 MG/ML IV SOLN
4.0000 mg | Freq: Once | INTRAVENOUS | Status: AC
  Administered 2015-05-12: 4 mg via INTRAVENOUS

## 2015-05-12 MED ORDER — MORPHINE SULFATE (PF) 4 MG/ML IV SOLN
4.0000 mg | Freq: Once | INTRAVENOUS | Status: AC
  Administered 2015-05-12: 4 mg via INTRAVENOUS
  Filled 2015-05-12: qty 1

## 2015-05-12 MED ORDER — MORPHINE SULFATE (PF) 4 MG/ML IV SOLN: 4.0000 mg | Freq: Once | INTRAVENOUS | Status: DC

## 2015-05-12 MED ORDER — DEXTROSE 5 % IV SOLN
500.0000 mg | INTRAVENOUS | Status: DC
  Administered 2015-05-13: 500 mg via INTRAVENOUS
  Filled 2015-05-12 (×2): qty 500

## 2015-05-12 MED ORDER — METOPROLOL TARTRATE 25 MG PO TABS
25.0000 mg | ORAL_TABLET | Freq: Two times a day (BID) | ORAL | Status: DC
  Administered 2015-05-13 (×3): 25 mg via ORAL
  Filled 2015-05-12 (×3): qty 1

## 2015-05-12 MED ORDER — MORPHINE SULFATE (PF) 4 MG/ML IV SOLN
INTRAVENOUS | Status: AC
  Filled 2015-05-12: qty 1

## 2015-05-12 MED ORDER — SODIUM CHLORIDE 0.9 % IV SOLN
Freq: Once | INTRAVENOUS | Status: AC
  Administered 2015-05-12: 1000 mL via INTRAVENOUS

## 2015-05-12 MED ORDER — IOPAMIDOL (ISOVUE-370) INJECTION 76%
100.0000 mL | Freq: Once | INTRAVENOUS | Status: AC | PRN
  Administered 2015-05-12: 100 mL via INTRAVENOUS

## 2015-05-12 MED ORDER — ONDANSETRON HCL 4 MG/2ML IJ SOLN
4.0000 mg | Freq: Once | INTRAMUSCULAR | Status: AC
  Administered 2015-05-12: 4 mg via INTRAVENOUS
  Filled 2015-05-12: qty 2

## 2015-05-12 MED ORDER — DIATRIZOATE MEGLUMINE & SODIUM 66-10 % PO SOLN
15.0000 mL | Freq: Once | ORAL | Status: AC
  Administered 2015-05-12: 15 mL via ORAL

## 2015-05-12 MED ORDER — ONDANSETRON HCL 4 MG/2ML IJ SOLN
4.0000 mg | Freq: Four times a day (QID) | INTRAMUSCULAR | Status: DC
  Administered 2015-05-13 (×4): 4 mg via INTRAVENOUS
  Filled 2015-05-12 (×4): qty 2

## 2015-05-12 NOTE — ED Notes (Signed)
Pt here with c/o left sided cp that began today, also c/o n,v,d that began today as well, states his cp began when he was up moving around today, has had this pain in the past. Has had kidney stones in the past, states the pain feels the same, cannot tell me what side the pain is on, pt cannot sit still in chair, heavy breathing as well.

## 2015-05-12 NOTE — ED Provider Notes (Signed)
[facility] Emergency Department Provider Note   ____________________________________________  Time seen: Approximately 7:32 PM  I have reviewed the triage vital signs and the nursing notes.   HISTORY  Chief Complaint Chest Pain; Nausea; Emesis; Diarrhea; and Flank Pain    HPI [name] is a 55 y.o. male who complains of onset of pain in the left lower chest  Left upper abdomen this morning when he woke up. He has nausea vomiting and diarrhea but no fever he complains of shortness of breath as well. He had pain something like this with a kidney stone in the past. Pain is severe patient also had a right knee replacement about 2 weeks ago. Pain is been going on since it started. Nothing seems to make it better or worse exercising and getting up taking a deep breath etc.   Past Medical History  Diagnosis Date  . Arthritis     knees and hands  . Chronic kidney disease     kidney stones  . Depression   . Restless leg syndrome   . Bipolar disorder (HCC)   . Anxiety   . PTSD (post-traumatic stress disorder)   . GERD (gastroesophageal reflux disease)   . Syncope   . Hepatitis     HEP "C"    Patient Active Problem List   Diagnosis Date Noted  . Pericarditis with effusion 05/13/2015  . Chest pain at rest 05/12/2015  . Dyspnea 05/12/2015  . SIRS (systemic inflammatory response syndrome) (HCC) 05/12/2015  . 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 Cystoscopy with ureteroscopy and stent placement    . Knee arthroscopy Right 06/25/2014    Procedure: ARTHROSCOPY KNEE;  Surgeon: Deeann Saint, MD;  Location: [facility] ORS;  Service: Orthopedics;  Laterality: Right;  partial arthroscopic medial menisectomy  . Joint replacement    . Total knee arthroplasty Right 04/22/2015    Procedure: TOTAL KNEE ARTHROPLASTY;  Surgeon: Deeann Saint, MD;  Location: [facility] ORS;  Service: Orthopedics;  Laterality: Right;      Current Outpatient Rx  Name  Route  Sig  Dispense  Refill  . benztropine (COGENTIN) 0.5 MG tablet   Oral   Take 1 tablet (0.5 mg total) by mouth 2 (two) times daily as needed for tremors.   30 tablet   2   . divalproex (DEPAKOTE) 500 MG DR tablet   Oral   Take 500 mg by mouth 2 (two) times daily.         . enalapril (VASOTEC) 10 MG tablet   Oral   Take 10 mg by mouth daily.         Marland Kitchen FLUoxetine (PROZAC) 20 MG capsule   Oral   Take 20 mg by mouth daily.         Marland Kitchen gabapentin (NEURONTIN) 400 MG capsule   Oral   Take 1 capsule (400 mg total) by mouth 3 (three) times daily.   60 capsule   3   . HYDROcodone-acetaminophen (NORCO) 7.5-325 MG tablet   Oral   Take 1 tablet by mouth every 6 (six) hours as needed for moderate pain.   50 tablet   0   . methocarbamol (ROBAXIN) 500 MG tablet   Oral   Take 1 tablet (500 mg total) by mouth every 6 (six) hours as needed for muscle spasms.   60 tablet   2   . QUEtiapine (SEROQUEL) 50 MG tablet   Oral  Take 50 mg by mouth 4 (four) times daily as needed (for agitation).          . SEROQUEL XR 400 MG 24 hr tablet   Oral   Take 400 mg by mouth at bedtime.       2     Dispense as written.   . tamsulosin (FLOMAX) 0.4 MG CAPS capsule   Oral   Take 0.4 mg by mouth daily after breakfast.         . traMADol (ULTRAM) 50 MG tablet   Oral   Take 50 mg by mouth every 6 (six) hours as needed for moderate pain.         Marland Kitchen aspirin 325 MG tablet   Oral   Take 2 tablets (650 mg total) by mouth daily. X 2 weeks   60 tablet   0   . colchicine 0.6 MG tablet   Oral   Take 1 tablet (0.6 mg total) by mouth 2 (two) times daily. X 2 weeks   28 tablet   00   . pantoprazole (PROTONIX) 40 MG tablet   Oral   Take 1 tablet (40 mg total) by mouth daily.   30 tablet   2     Allergies Toradol  Family History  Problem Relation Age of Onset  . CVA Mother     deceased    Social History Social History  Substance Use  Topics  . Smoking status: Former Smoker -- 1.00 packs/day    Types: Cigarettes    Quit date: 05/16/1984  . Smokeless tobacco: Never Used  . Alcohol Use: No    Review of Systems Constitutional: No fever/chills Eyes: No visual changes. ENT: No sore throat. Cardiovascular: The history of present illness Respiratory: shortness of breath. Gastrointestinal: See history of present illness Genitourinary: Negative for dysuria or hematuria! Musculoskeletal: Negative for back pain. Skin: Negative for rash. Neurological: Negative for headaches, focal weakness or numbness.  10-point ROS otherwise negative.  ____________________________________________   PHYSICAL EXAM:  VITAL SIGNS: ED Triage Vitals  Enc Vitals Group     BP 05/12/15 1905 128/96 mmHg     Pulse Rate 05/12/15 1905 116     Resp 05/12/15 1905 22     Temp 05/12/15 1905 97.9 F (36.6 C)     Temp Source 05/12/15 1905 Oral     SpO2 05/12/15 1905 100 %     Weight 05/12/15 1903 180 lb (81.647 kg)     Height 05/12/15 1903 6' (1.829 m)     Head Cir --      Peak Flow --      Pain Score 05/12/15 1903 10     Pain Loc --      Pain Edu? --      Excl. in GC? --    Constitutional: Alert and oriented. Patient looks gray and in distress Eyes: Conjunctivae are normal. PERRL. EOMI. Head: Atraumatic. Nose: No congestion/rhinnorhea. Mouth/Throat: Mucous membranes are moist.  Oropharynx non-erythematous. Neck: No stridor.  { Cardiovascular: Normal rate, regular rhythm. Grossly normal heart sounds.  Good peripheral circulation. Respiratory: Normal respiratory effort.  No retractions. Lungs CTAB. Gastrointestinal: Soft tender left upper quadrant of the abdomen No distention. No abdominal bruits. No CVA tenderness. Musculoskeletal: No lower extremity tenderness nor edema.  No joint effusions. Neurologic:  Normal speech and language. No gross focal neurologic deficits are appreciated. No gait instability. Skin:  Skin is warm, dry and  intact. No rash noted. Psychiatric: Mood and affect are  normal. Speech and behavior are normal.  ____________________________________________   LABS (all labs ordered are listed, but only abnormal results are displayed)  Labs Reviewed  BASIC METABOLIC PANEL - Abnormal; Notable for the following:    CO2 16 (*)    Glucose, Bld 122 (*)    Anion gap 18 (*)    All other components within normal limits  CBC - Abnormal; Notable for the following:    WBC 15.0 (*)    RBC 3.72 (*)    Hemoglobin 11.1 (*)    HCT 32.3 (*)    RDW 15.2 (*)    Platelets 635 (*)    All other components within normal limits  URINALYSIS COMPLETEWITH MICROSCOPIC ([facility] ONLY) - Abnormal; Notable for the following:    Color, Urine YELLOW (*)    APPearance CLEAR (*)    Ketones, ur TRACE (*)    Specific Gravity, Urine 1.048 (*)    All other components within normal limits  HEPATIC FUNCTION PANEL - Abnormal; Notable for the following:    Total Protein 8.4 (*)    ALT 16 (*)    Bilirubin, Direct <0.1 (*)    All other components within normal limits  TROPONIN I - Abnormal; Notable for the following:    Troponin I 0.04 (*)    All other components within normal limits  CBC - Abnormal; Notable for the following:    WBC 10.9 (*)    RBC 3.49 (*)    Hemoglobin 10.4 (*)    HCT 29.6 (*)    RDW 15.3 (*)    Platelets 608 (*)    All other components within normal limits  BASIC METABOLIC PANEL - Abnormal; Notable for the following:    CO2 21 (*)    Glucose, Bld 122 (*)    All other components within normal limits  CBC - Abnormal; Notable for the following:    RBC 3.51 (*)    Hemoglobin 10.2 (*)    HCT 30.1 (*)    RDW 15.1 (*)    Platelets 539 (*)    All other components within normal limits  C DIFFICILE QUICK SCREEN W PCR REFLEX  LIPASE, BLOOD  CREATININE, SERUM  TROPONIN I  TROPONIN I  TROPONIN I   ____________________________________________  EKG  EKG read and interpreted by me shows sinus tachycardia  rate of 121 normal axis diffuse T-wave flattening compared to prior EKG. ____________________________________________  RADIOLOGY  Pericardial effusion is noted by radiology ____________________________________________   PROCEDURES    ____________________________________________   INITIAL IMPRESSION / ASSESSMENT AND PLAN / ED COURSE  Pertinent labs & imaging results that were available during my care of the patient were reviewed by me and considered in my medical decision making (see chart for details).   ____________________________________________   FINAL CLINICAL IMPRESSION(S) / ED DIAGNOSES  Final diagnoses:  Elevated troponin  Pericardial effusion      NEW MEDICATIONS STARTED DURING THIS VISIT:  Discharge Medication List as of 05/14/2015  9:41 AM    START taking these medications   Details  aspirin 325 MG tablet Take 2 tablets (650 mg total) by mouth daily. X 2 weeks, Starting 05/14/2015, Until Discontinued, Normal    colchicine 0.6 MG tablet Take 1 tablet (0.6 mg total) by mouth 2 (two) times daily. X 2 weeks, Starting 05/14/2015, Until Discontinued, Normal    pantoprazole (PROTONIX) 40 MG tablet Take 1 tablet (40 mg total) by mouth daily., Starting 05/14/2015, Until Discontinued, Normal  Note:  This document was prepared using Dragon voice recognition software and may include unintentional dictation errors.    Arnaldo NatalPaul F Haelie Clapp, MD 05/14/15 [phone]

## 2015-05-12 NOTE — H&P (Addendum)
[facility] - [place] [facility]   PATIENT NAME: [name]    MR#:  [id number]  DATE OF BIRTH:  1960-08-17  DATE OF ADMISSION:  05/12/2015  PRIMARY CARE PHYSICIAN: [facility]   REQUESTING/REFERRING PHYSICIAN:   CHIEF COMPLAINT:   Chief Complaint  Patient presents with  . Chest Pain  . Nausea  . Emesis  . Diarrhea  . Flank Pain    HISTORY OF PRESENT ILLNESS: [name]  is a 55 y.o. male with a known history of osteoarthritis, chronic kidney disease, nephrolithiasis, bipolar disorder, GERD, hepatitis C presented to the emergency room to difficulty breathing as well as chest discomfort. Patient's chest pain was located in the left side of the chest and aggravates whenever he takes a deep breath. Pain is sharp in nature 5 out of 10 on a scale of 1-10. Patient also not able to take a deep breath. Patient had a right knee replacement 2 weeks ago. Patient was worked up with a CT angiogram of the chest in the emergency room and no evidence of pulmonary embolism found. CT angiogram of the chest showed moderate pericardial effusion. During the workup patient's WBC count was also elevated. Patient also has some nausea and vomiting vomitus contained food and water. No history of any hematemesis hemoptysis or rectal bleeding. First set of troponin is borderline. EKG shows tachycardia but no ST segment changes. Hospitalist service was consulted for further care.  PAST MEDICAL HISTORY:   Past Medical History  Diagnosis Date  . Arthritis     knees and hands  . Chronic kidney disease     kidney stones  . Depression   . Restless leg syndrome   . Bipolar disorder (HCC)   . Anxiety   . PTSD (post-traumatic stress disorder)   . GERD (gastroesophageal reflux disease)   . Syncope   . Hepatitis     HEP "C"    PAST SURGICAL HISTORY: Past Surgical History  Procedure Laterality Date  . Cystoscopy with ureteroscopy and stent placement    . Knee  arthroscopy Right 06/25/2014    Procedure: ARTHROSCOPY KNEE;  Surgeon: Deeann Saint, MD;  Location: [facility] ORS;  Service: Orthopedics;  Laterality: Right;  partial arthroscopic medial menisectomy  . Joint replacement    . Total knee arthroplasty Right 04/22/2015    Procedure: TOTAL KNEE ARTHROPLASTY;  Surgeon: Deeann Saint, MD;  Location: [facility] ORS;  Service: Orthopedics;  Laterality: Right;    SOCIAL HISTORY:  Social History  Substance Use Topics  . Smoking status: Former Smoker -- 1.00 packs/day    Types: Cigarettes    Quit date: 05/16/1984  . Smokeless tobacco: Never Used  . Alcohol Use: No    FAMILY HISTORY:  Family History  Problem Relation Age of Onset  . CVA Mother     deceased    DRUG ALLERGIES:  Allergies  Allergen Reactions  . Toradol [Ketorolac Tromethamine] Hives    REVIEW OF SYSTEMS:   CONSTITUTIONAL: No fever, has weakness.  EYES: No blurred or double vision.  EARS, NOSE, AND THROAT: No tinnitus or ear pain.  RESPIRATORY: Has occasional cough, has shortness of breath,no wheezing or hemoptysis.  CARDIOVASCULAR: has chest pain, no orthopnea, edema.  GASTROINTESTINAL: Has nausea, vomiting, no diarrhea or abdominal pain.  GENITOURINARY: No dysuria, hematuria.  ENDOCRINE: No polyuria, nocturia,  HEMATOLOGY: No anemia, easy bruising or bleeding SKIN: No rash or lesion. MUSCULOSKELETAL: No joint pain or arthritis.   NEUROLOGIC: No tingling, numbness, weakness.  PSYCHIATRY: No anxiety or depression.   MEDICATIONS AT HOME:  Prior to Admission medications   Medication Sig Start Date End Date Taking? Authorizing Provider  benztropine (COGENTIN) 0.5 MG tablet Take 1 tablet (0.5 mg total) by mouth 2 (two) times daily as needed for tremors. 04/25/15  Yes Deeann Saint, MD  divalproex (DEPAKOTE) 500 MG DR tablet Take 500 mg by mouth 2 (two) times daily.   Yes Historical Provider, MD  enalapril (VASOTEC) 10 MG tablet Take 10 mg by mouth daily.   Yes Historical Provider,  MD  FLUoxetine (PROZAC) 20 MG capsule Take 20 mg by mouth daily.   Yes Historical Provider, MD  gabapentin (NEURONTIN) 400 MG capsule Take 1 capsule (400 mg total) by mouth 3 (three) times daily. 04/25/15  Yes Deeann Saint, MD  HYDROcodone-acetaminophen (NORCO) 7.5-325 MG tablet Take 1 tablet by mouth every 6 (six) hours as needed for moderate pain. 04/25/15  Yes Deeann Saint, MD  meloxicam (MOBIC) 15 MG tablet Take 1 tablet (15 mg total) by mouth daily. 04/25/15  Yes Deeann Saint, MD  methocarbamol (ROBAXIN) 500 MG tablet Take 1 tablet (500 mg total) by mouth every 6 (six) hours as needed for muscle spasms. 04/25/15  Yes Deeann Saint, MD  QUEtiapine (SEROQUEL) 50 MG tablet Take 50 mg by mouth 4 (four) times daily as needed (for agitation).    Yes Historical Provider, MD  SEROQUEL XR 400 MG 24 hr tablet Take 400 mg by mouth at bedtime.    Yes Historical Provider, MD  tamsulosin (FLOMAX) 0.4 MG CAPS capsule Take 0.4 mg by mouth daily after breakfast.   Yes Historical Provider, MD  traMADol (ULTRAM) 50 MG tablet Take 50 mg by mouth every 6 (six) hours as needed for moderate pain.   Yes Historical Provider, MD      PHYSICAL EXAMINATION:   VITAL SIGNS: Blood pressure 139/104, pulse 106, temperature 97.9 F (36.6 C), temperature source Oral, resp. rate 15, height 6' (1.829 m), weight 81.647 kg (180 lb), SpO2 99 %.  GENERAL:  54 y.o.-year-old patient lying in the bed in mild distress.  EYES: Pupils equal, round, reactive to light and accommodation. No scleral icterus. Extraocular muscles intact.  HEENT: Head atraumatic, normocephalic. Oropharynx and nasopharynx clear.  NECK:  Supple, no jugular venous distention. No thyroid enlargement, no tenderness.  LUNGS: Adequate breath sounds bilaterally, no wheezing,scattered rales noted,no rhonchi or crepitation. No use of accessory muscles of respiration.  CARDIOVASCULAR: S1, S2 normal. No murmurs, rubs, or gallops.  ABDOMEN: Soft, nontender, nondistended.  Bowel sounds present. No organomegaly or mass.  EXTREMITIES: No pedal edema, cyanosis, or clubbing.  NEUROLOGIC: Cranial nerves II through XII are intact. Muscle strength 5/5 in all extremities. Sensation intact. Gait normal PSYCHIATRIC: The patient is alert and oriented x 3.  SKIN: No obvious rash, lesion, or ulcer.   LABORATORY PANEL:   CBC  Recent Labs Lab 05/12/15 1942  WBC 15.0*  HGB 11.1*  HCT 32.3*  PLT 635*  MCV 87.0  MCH 30.0  MCHC 34.4  RDW 15.2*   ------------------------------------------------------------------------------------------------------------------  Chemistries   Recent Labs Lab 05/12/15 1942  NA 137  K 3.9  CL 103  CO2 16*  GLUCOSE 122*  BUN 19  CREATININE 1.06  CALCIUM 10.2  AST 22  ALT 16*  ALKPHOS 82  BILITOT 0.7   ------------------------------------------------------------------------------------------------------------------ estimated creatinine clearance is 89.5 mL/min (by C-G formula based on Cr of 1.06). ------------------------------------------------------------------------------------------------------------------ No results for input(s): TSH, T4TOTAL, T3FREE, THYROIDAB in the last 72  hours.  Invalid input(s): FREET3   Coagulation profile No results for input(s): INR, PROTIME in the last 168 hours. ------------------------------------------------------------------------------------------------------------------- No results for input(s): DDIMER in the last 72 hours. -------------------------------------------------------------------------------------------------------------------  Cardiac Enzymes  Recent Labs Lab 05/12/15 1942  TROPONINI 0.04*   ------------------------------------------------------------------------------------------------------------------ Invalid input(s): POCBNP  ---------------------------------------------------------------------------------------------------------------  Urinalysis     Component Value Date/Time   COLORURINE STRAW* 04/16/2015 0842   APPEARANCEUR CLEAR* 04/16/2015 0842   LABSPEC 1.008 04/16/2015 0842   PHURINE 7.0 04/16/2015 0842   GLUCOSEU NEGATIVE 04/16/2015 0842   HGBUR NEGATIVE 04/16/2015 0842   BILIRUBINUR NEGATIVE 04/16/2015 0842   KETONESUR NEGATIVE 04/16/2015 0842   PROTEINUR NEGATIVE 04/16/2015 0842   NITRITE NEGATIVE 04/16/2015 0842   LEUKOCYTESUR NEGATIVE 04/16/2015 0842     RADIOLOGY: Ct Angio Chest Pe W/cm &/or Wo Cm  05/12/2015  CLINICAL DATA:  Left-sided chest pain beginning today. Nausea, vomiting, and diarrhea. History of kidney stones. Shortness of breath. Right knee replacement surgery 2 weeks ago. EXAM: CT ANGIOGRAPHY CHEST CT ABDOMEN AND PELVIS WITH CONTRAST TECHNIQUE: Multidetector CT imaging of the chest was performed using the standard protocol during bolus administration of intravenous contrast. Multiplanar CT image reconstructions and MIPs were obtained to evaluate the vascular anatomy. Multidetector CT imaging of the abdomen and pelvis was performed using the standard protocol during bolus administration of intravenous contrast. CONTRAST:  175 mL Isovue 370. Patient was initially injected with 100 mL, resulting in suboptimal opacification of the pulmonary arteries. Patient was reinjected 75 mL and rescanned. COMPARISON:  02/01/2007 FINDINGS: CTA CHEST FINDINGS Technically adequate study with moderately good opacification of the central and segmental pulmonary arteries. No focal filling defects are demonstrated. No evidence of significant pulmonary embolus. Normal heart size. Moderate size pericardial effusion. Coronary artery calcifications. Normal caliber thoracic aorta. Great vessel origins are patent. No evidence of aortic dissection. Scattered lymph nodes in the mediastinum and hilar regions are not pathologically enlarged. The esophagus is decompressed. The mild emphysematous changes in the upper lungs. No focal airspace disease or  consolidation. No pleural effusions. No pneumothorax. Airways appear patent. CT ABDOMEN and PELVIS FINDINGS The liver, spleen, gallbladder, pancreas, adrenal glands, abdominal aorta, inferior vena cava, and retroperitoneal lymph nodes are unremarkable. Nonobstructing stone in the midpole of the right kidney. No hydronephrosis or hydroureter. Renal nephrograms are symmetrical. Stomach, small bowel, and colon are not abnormally distended. No free air or free fluid in the abdomen. Small umbilical hernia containing fat. Pelvis: The appendix is normal. Bladder wall is not thickened. Prostate gland is mildly enlarged at 3.7 cm diameter. No free or loculated pelvic fluid collections. No pelvic mass or lymphadenopathy. No evidence of diverticulitis. No destructive bone lesions. Review of the MIP images confirms the above findings. IMPRESSION: No evidence of significant pulmonary embolus. Moderate-sized pericardial effusion. Nonobstructing stone in the right kidney. No evidence of bowel obstruction or inflammation. Electronically Signed   By: Burman NievesWilliam  Stevens M.D.   On: 05/12/2015 22:42   Ct Abdomen Pelvis W Contrast  05/12/2015  CLINICAL DATA:  Left-sided chest pain beginning today. Nausea, vomiting, and diarrhea. History of kidney stones. Shortness of breath. Right knee replacement surgery 2 weeks ago. EXAM: CT ANGIOGRAPHY CHEST CT ABDOMEN AND PELVIS WITH CONTRAST TECHNIQUE: Multidetector CT imaging of the chest was performed using the standard protocol during bolus administration of intravenous contrast. Multiplanar CT image reconstructions and MIPs were obtained to evaluate the vascular anatomy. Multidetector CT imaging of the abdomen and pelvis was performed using the standard protocol during bolus administration of  intravenous contrast. CONTRAST:  175 mL Isovue 370. Patient was initially injected with 100 mL, resulting in suboptimal opacification of the pulmonary arteries. Patient was reinjected 75 mL and rescanned.  COMPARISON:  02/01/2007 FINDINGS: CTA CHEST FINDINGS Technically adequate study with moderately good opacification of the central and segmental pulmonary arteries. No focal filling defects are demonstrated. No evidence of significant pulmonary embolus. Normal heart size. Moderate size pericardial effusion. Coronary artery calcifications. Normal caliber thoracic aorta. Great vessel origins are patent. No evidence of aortic dissection. Scattered lymph nodes in the mediastinum and hilar regions are not pathologically enlarged. The esophagus is decompressed. The mild emphysematous changes in the upper lungs. No focal airspace disease or consolidation. No pleural effusions. No pneumothorax. Airways appear patent. CT ABDOMEN and PELVIS FINDINGS The liver, spleen, gallbladder, pancreas, adrenal glands, abdominal aorta, inferior vena cava, and retroperitoneal lymph nodes are unremarkable. Nonobstructing stone in the midpole of the right kidney. No hydronephrosis or hydroureter. Renal nephrograms are symmetrical. Stomach, small bowel, and colon are not abnormally distended. No free air or free fluid in the abdomen. Small umbilical hernia containing fat. Pelvis: The appendix is normal. Bladder wall is not thickened. Prostate gland is mildly enlarged at 3.7 cm diameter. No free or loculated pelvic fluid collections. No pelvic mass or lymphadenopathy. No evidence of diverticulitis. No destructive bone lesions. Review of the MIP images confirms the above findings. IMPRESSION: No evidence of significant pulmonary embolus. Moderate-sized pericardial effusion. Nonobstructing stone in the right kidney. No evidence of bowel obstruction or inflammation. Electronically Signed   By: Burman Nieves M.D.   On: 05/12/2015 22:42    EKG: Orders placed or performed during the hospital encounter of 05/12/15  . EKG 12-Lead  . EKG 12-Lead     IMPRESSION AND PLAN: 55 year old male patient with history of arthritis, hepatitis C,  chronic kidney disease, bipolar disorder presented to the emergency room with chest pain as well as difficulty breathing. Patient also had some nausea and vomiting. Admitting diagnosis 1. Chest pain rule out acute coronary syndrome 2. Pericardial effusion 3. Dyspnea which could be secondary to pericardial effusion 4. Systemic inflammatory response syndrome 5. Leukocytosis 6.Non obstructing Right kidney stone Treatment plan Admit patient to telemetry inpatient service Start patient on IV Rocephin and IV Zithromax antibiotics Check urine analysis Echocardiogram to assess pericardial effusion Cycle troponin to rule out ischemia DVT prophylaxis with subcutaneous Lovenox 40 MG daily Cardiology consultation for pericardial effusion and chest pain Supportive care.  All the records are reviewed and case discussed with ED provider. Management plans discussed with the patient, family and they are in agreement.  CODE STATUS:FULL Code Status History    This patient does not have a recorded code status. Please follow your organizational policy for patients in this situation.       TOTAL TIME TAKING CARE OF THIS PATIENT: 59 minutes.    Ihor Austin M.D on 05/12/2015 at 11:49 PM  Between 7am to 6pm - Pager - [phone]  After 6pm go to [email or web address] - password [email or web address] [facility]  [facility]  Office  [phone]  CC: Primary care physician; [facility]

## 2015-05-12 NOTE — ED Notes (Signed)
Patient to CT via stretcher with staff.

## 2015-05-13 ENCOUNTER — Inpatient Hospital Stay (HOSPITAL_COMMUNITY)
Admit: 2015-05-13 | Discharge: 2015-05-13 | Disposition: A | Discharge status: Home / Self Care | Payer: Medicaid Other | Attending: Internal Medicine | Admitting: Internal Medicine

## 2015-05-13 DIAGNOSIS — I319 Disease of pericardium, unspecified: Secondary | ICD-10-CM | POA: Diagnosis present

## 2015-05-13 DIAGNOSIS — R079 Chest pain, unspecified: Secondary | ICD-10-CM

## 2015-05-13 DIAGNOSIS — R0600 Dyspnea, unspecified: Secondary | ICD-10-CM

## 2015-05-13 DIAGNOSIS — R6510 Systemic inflammatory response syndrome (SIRS) of non-infectious origin without acute organ dysfunction: Secondary | ICD-10-CM

## 2015-05-13 LAB — BASIC METABOLIC PANEL
Anion gap: 11 (ref 5–15)
BUN: 15 mg/dL (ref 6–20)
CO2: 21 mmol/L — ABNORMAL LOW (ref 22–32)
Calcium: 9.3 mg/dL (ref 8.9–10.3)
Chloride: 106 mmol/L (ref 101–111)
Creatinine, Ser: 0.97 mg/dL (ref 0.61–1.24)
GFR calc Af Amer: >60 mL/min (ref >60)
GFR calc non Af Amer: >60 mL/min (ref >60)
Glucose, Bld: 122 mg/dL — ABNORMAL HIGH (ref 65–99)
Potassium: 4.1 mmol/L (ref 3.5–5.1)
Sodium: 138 mmol/L (ref 135–145)

## 2015-05-13 LAB — URINALYSIS COMPLETE WITH MICROSCOPIC (ARMC ONLY)
Bacteria, UA: NONE SEEN
Bilirubin Urine: NEGATIVE
Glucose, UA: NEGATIVE mg/dL
Hgb urine dipstick: NEGATIVE
Leukocytes, UA: NEGATIVE
Nitrite: NEGATIVE
Protein, ur: NEGATIVE mg/dL
SQUAMOUS EPITHELIAL / LPF: NONE SEEN
Specific Gravity, Urine: 1.048 — ABNORMAL HIGH (ref 1.005–1.030)
pH: 6 (ref 5.0–8.0)

## 2015-05-13 LAB — CBC
HCT: 29.6 % — ABNORMAL LOW (ref 40.0–52.0)
HCT: 30.1 % — ABNORMAL LOW (ref 40.0–52.0)
Hemoglobin: 10.4 g/dL — ABNORMAL LOW (ref 13.0–18.0)
Hemoglobin: 10.2 g/dL — ABNORMAL LOW (ref 13.0–18.0)
MCH: 29.7 pg (ref 26.0–34.0)
MCH: 29 pg (ref 26.0–34.0)
MCHC: 35 g/dL (ref 32.0–36.0)
MCHC: 33.9 g/dL (ref 32.0–36.0)
MCV: 85 fL (ref 80.0–100.0)
MCV: 85.7 fL (ref 80.0–100.0)
PLATELETS: 608 10*3/uL — AB (ref 150–440)
Platelets: 539 10*3/uL — ABNORMAL HIGH (ref 150–440)
RBC: 3.49 MIL/uL — ABNORMAL LOW (ref 4.40–5.90)
RBC: 3.51 MIL/uL — ABNORMAL LOW (ref 4.40–5.90)
RDW: 15.3 % — AB (ref 11.5–14.5)
RDW: 15.1 % — ABNORMAL HIGH (ref 11.5–14.5)
WBC: 10.9 10*3/uL — ABNORMAL HIGH (ref 3.8–10.6)
WBC: 8.7 10*3/uL (ref 3.8–10.6)

## 2015-05-13 LAB — ECHOCARDIOGRAM COMPLETE
HEIGHTINCHES: 72 in
WEIGHTICAEL: 2792 [oz_av]

## 2015-05-13 LAB — CREATININE, SERUM
Creatinine, Ser: 1.01 mg/dL (ref 0.61–1.24)
GFR calc Af Amer: >60 mL/min (ref >60)
GFR calc non Af Amer: >60 mL/min (ref >60)

## 2015-05-13 LAB — TROPONIN I
Troponin I: <0.03 ng/mL (ref <0.031)
Troponin I: <0.03 ng/mL (ref <0.031)

## 2015-05-13 MED ORDER — TAMSULOSIN HCL 0.4 MG PO CAPS
0.4000 mg | ORAL_CAPSULE | Freq: Every day | ORAL | Status: DC
  Administered 2015-05-13 – 2015-05-14 (×2): 0.4 mg via ORAL
  Filled 2015-05-13 (×2): qty 1

## 2015-05-13 MED ORDER — NITROGLYCERIN 0.4 MG SL SUBL
0.4000 mg | SUBLINGUAL_TABLET | SUBLINGUAL | Status: DC | PRN
Start: 2015-05-13 — End: 2015-05-14

## 2015-05-13 MED ORDER — ENALAPRIL MALEATE 10 MG PO TABS
10.0000 mg | ORAL_TABLET | Freq: Every day | ORAL | Status: DC
  Administered 2015-05-13: 10 mg via ORAL
  Filled 2015-05-13: qty 1

## 2015-05-13 MED ORDER — QUETIAPINE FUMARATE 25 MG PO TABS: 50.0000 mg | ORAL_TABLET | Freq: Four times a day (QID) | ORAL | Status: DC | PRN

## 2015-05-13 MED ORDER — ENOXAPARIN SODIUM 40 MG/0.4ML ~~LOC~~ SOLN
40.0000 mg | SUBCUTANEOUS | Status: DC
  Administered 2015-05-13: 40 mg via SUBCUTANEOUS
  Filled 2015-05-13: qty 0.4

## 2015-05-13 MED ORDER — ASPIRIN 300 MG RE SUPP: 300.0000 mg | RECTAL | Status: AC

## 2015-05-13 MED ORDER — HYDROCODONE-ACETAMINOPHEN 7.5-325 MG PO TABS
1.0000 | ORAL_TABLET | Freq: Four times a day (QID) | ORAL | Status: DC | PRN
  Administered 2015-05-13 – 2015-05-14 (×5): 1 via ORAL
  Filled 2015-05-13 (×5): qty 1

## 2015-05-13 MED ORDER — MORPHINE SULFATE (PF) 2 MG/ML IV SOLN
2.0000 mg | INTRAVENOUS | Status: DC | PRN
  Administered 2015-05-13 – 2015-05-14 (×4): 2 mg via INTRAVENOUS
  Filled 2015-05-13 (×4): qty 1

## 2015-05-13 MED ORDER — ACETAMINOPHEN 325 MG PO TABS: 650.0000 mg | ORAL_TABLET | ORAL | Status: DC | PRN

## 2015-05-13 MED ORDER — DIVALPROEX SODIUM 500 MG PO DR TAB
500.0000 mg | DELAYED_RELEASE_TABLET | Freq: Two times a day (BID) | ORAL | Status: DC
  Administered 2015-05-13 – 2015-05-14 (×3): 500 mg via ORAL
  Filled 2015-05-13 (×3): qty 1

## 2015-05-13 MED ORDER — FLUOXETINE HCL 20 MG PO CAPS
20.0000 mg | ORAL_CAPSULE | Freq: Every day | ORAL | Status: DC
  Administered 2015-05-13 – 2015-05-14 (×2): 20 mg via ORAL
  Filled 2015-05-13 (×2): qty 1

## 2015-05-13 MED ORDER — BENZTROPINE MESYLATE 0.5 MG PO TABS
0.5000 mg | ORAL_TABLET | Freq: Two times a day (BID) | ORAL | Status: DC | PRN
  Filled 2015-05-13: qty 1

## 2015-05-13 MED ORDER — SODIUM CHLORIDE 0.9 % IV SOLN
INTRAVENOUS | Status: DC
Start: 2015-05-13 — End: 2015-05-14
  Administered 2015-05-13 (×2): via INTRAVENOUS

## 2015-05-13 MED ORDER — DEXTROSE 5 % IV SOLN: 2.0000 g | INTRAVENOUS | Status: DC

## 2015-05-13 MED ORDER — GABAPENTIN 400 MG PO CAPS
400.0000 mg | ORAL_CAPSULE | Freq: Three times a day (TID) | ORAL | Status: DC
  Administered 2015-05-13 – 2015-05-14 (×4): 400 mg via ORAL
  Filled 2015-05-13 (×4): qty 1

## 2015-05-13 MED ORDER — ASPIRIN 325 MG PO TABS
650.0000 mg | ORAL_TABLET | Freq: Every day | ORAL | Status: DC
  Administered 2015-05-13: 650 mg via ORAL
  Filled 2015-05-13: qty 2

## 2015-05-13 MED ORDER — COLCHICINE 0.6 MG PO TABS
0.6000 mg | ORAL_TABLET | Freq: Two times a day (BID) | ORAL | Status: DC
  Administered 2015-05-13 – 2015-05-14 (×3): 0.6 mg via ORAL
  Filled 2015-05-13 (×3): qty 1

## 2015-05-13 MED ORDER — ASPIRIN 81 MG PO CHEW
324.0000 mg | CHEWABLE_TABLET | ORAL | Status: AC
  Administered 2015-05-13: 324 mg via ORAL
  Filled 2015-05-13: qty 4

## 2015-05-13 MED ORDER — DEXTROSE 5 % IV SOLN
2.0000 g | Freq: Once | INTRAVENOUS | Status: AC
  Administered 2015-05-13: 2 g via INTRAVENOUS
  Filled 2015-05-13: qty 2

## 2015-05-13 MED ORDER — QUETIAPINE FUMARATE ER 200 MG PO TB24
400.0000 mg | ORAL_TABLET | Freq: Every day | ORAL | Status: DC
  Administered 2015-05-13: 400 mg via ORAL
  Filled 2015-05-13 (×3): qty 2

## 2015-05-13 MED ORDER — ASPIRIN EC 81 MG PO TBEC
81.0000 mg | DELAYED_RELEASE_TABLET | Freq: Every day | ORAL | Status: DC
  Administered 2015-05-13: 81 mg via ORAL
  Filled 2015-05-13: qty 1

## 2015-05-13 MED ORDER — ONDANSETRON HCL 4 MG/2ML IJ SOLN: 4.0000 mg | Freq: Four times a day (QID) | INTRAMUSCULAR | Status: DC | PRN

## 2015-05-13 NOTE — Progress Notes (Signed)
[employer] - [place] at [facility]   PATIENT NAME: [name]    MR#:  [id number]  DATE OF BIRTH:  Jan 25, 1960  SUBJECTIVE:  CHIEF COMPLAINT:   Chief Complaint  Patient presents with  . Chest Pain  . Nausea  . Emesis  . Diarrhea  . Flank Pain  Found to have pericardia effusion , no,source of infection. Have diarrhea and nausea.   REVIEW OF SYSTEMS:  CONSTITUTIONAL: No fever, fatigue or weakness.  EYES: No blurred or double vision.  EARS, NOSE, AND THROAT: No tinnitus or ear pain.  RESPIRATORY: No cough, shortness of breath, wheezing or hemoptysis.  CARDIOVASCULAR: No chest pain, orthopnea, edema.  GASTROINTESTINAL: No nausea, vomiting, diarrhea or abdominal pain.  GENITOURINARY: No dysuria, hematuria.  ENDOCRINE: No polyuria, nocturia,  HEMATOLOGY: No anemia, easy bruising or bleeding SKIN: No rash or lesion. MUSCULOSKELETAL: No joint pain or arthritis.   NEUROLOGIC: No tingling, numbness, weakness.  PSYCHIATRY: No anxiety or depression.   ROS  DRUG ALLERGIES:   Allergies  Allergen Reactions  . Toradol [Ketorolac Tromethamine] Hives    VITALS:  Blood pressure 114/68, pulse 90, temperature 98.1 F (36.7 C), temperature source Oral, resp. rate 16, height 6' (1.829 m), weight 79.153 kg (174 lb 8 oz), SpO2 100 %.  PHYSICAL EXAMINATION:  GENERAL:  55 y.o.-year-old patient lying in the bed with no acute distress.  EYES: Pupils equal, round, reactive to light and accommodation. No scleral icterus. Extraocular muscles intact.  HEENT: Head atraumatic, normocephalic. Oropharynx and nasopharynx clear.  NECK:  Supple, no jugular venous distention. No thyroid enlargement, no tenderness.  LUNGS: Normal breath sounds bilaterally, no wheezing, rales,rhonchi or crepitation. No use of accessory muscles of respiration.  CARDIOVASCULAR: S1, S2 normal. No murmurs, rubs, or gallops.  ABDOMEN: Soft, nontender, nondistended. Bowel sounds present. No organomegaly or  mass.  EXTREMITIES: No pedal edema, cyanosis, or clubbing.  NEUROLOGIC: Cranial nerves II through XII are intact. Muscle strength 5/5 in all extremities. Sensation intact. Gait not checked.  PSYCHIATRIC: The patient is alert and oriented x 3.  SKIN: No obvious rash, lesion, or ulcer.   Physical Exam LABORATORY PANEL:   CBC  Recent Labs Lab 05/13/15 0727  WBC 8.7  HGB 10.2*  HCT 30.1*  PLT 539*   ------------------------------------------------------------------------------------------------------------------  Chemistries   Recent Labs Lab 05/12/15 1942  05/13/15 0727  NA 137  --  138  K 3.9  --  4.1  CL 103  --  106  CO2 16*  --  21*  GLUCOSE 122*  --  122*  BUN 19  --  15  CREATININE 1.06  < > 0.97  CALCIUM 10.2  --  9.3  AST 22  --   --   ALT 16*  --   --   ALKPHOS 82  --   --   BILITOT 0.7  --   --   < > = values in this interval not displayed. ------------------------------------------------------------------------------------------------------------------  Cardiac Enzymes  Recent Labs Lab 05/13/15 0727 05/13/15 1329  TROPONINI <0.03 <0.03   ------------------------------------------------------------------------------------------------------------------  RADIOLOGY:  Ct Angio Chest Pe W/cm &/or Wo Cm  05/12/2015  CLINICAL DATA:  Left-sided chest pain beginning today. Nausea, vomiting, and diarrhea. History of kidney stones. Shortness of breath. Right knee replacement surgery 2 weeks ago. EXAM: CT ANGIOGRAPHY CHEST CT ABDOMEN AND PELVIS WITH CONTRAST TECHNIQUE: Multidetector CT imaging of the chest was performed using the standard protocol during bolus administration of intravenous contrast. Multiplanar CT image reconstructions and MIPs  were obtained to evaluate the vascular anatomy. Multidetector CT imaging of the abdomen and pelvis was performed using the standard protocol during bolus administration of intravenous contrast. CONTRAST:  175 mL Isovue 370.  Patient was initially injected with 100 mL, resulting in suboptimal opacification of the pulmonary arteries. Patient was reinjected 75 mL and rescanned. COMPARISON:  02/01/2007 FINDINGS: CTA CHEST FINDINGS Technically adequate study with moderately good opacification of the central and segmental pulmonary arteries. No focal filling defects are demonstrated. No evidence of significant pulmonary embolus. Normal heart size. Moderate size pericardial effusion. Coronary artery calcifications. Normal caliber thoracic aorta. Great vessel origins are patent. No evidence of aortic dissection. Scattered lymph nodes in the mediastinum and hilar regions are not pathologically enlarged. The esophagus is decompressed. The mild emphysematous changes in the upper lungs. No focal airspace disease or consolidation. No pleural effusions. No pneumothorax. Airways appear patent. CT ABDOMEN and PELVIS FINDINGS The liver, spleen, gallbladder, pancreas, adrenal glands, abdominal aorta, inferior vena cava, and retroperitoneal lymph nodes are unremarkable. Nonobstructing stone in the midpole of the right kidney. No hydronephrosis or hydroureter. Renal nephrograms are symmetrical. Stomach, small bowel, and colon are not abnormally distended. No free air or free fluid in the abdomen. Small umbilical hernia containing fat. Pelvis: The appendix is normal. Bladder wall is not thickened. Prostate gland is mildly enlarged at 3.7 cm diameter. No free or loculated pelvic fluid collections. No pelvic mass or lymphadenopathy. No evidence of diverticulitis. No destructive bone lesions. Review of the MIP images confirms the above findings. IMPRESSION: No evidence of significant pulmonary embolus. Moderate-sized pericardial effusion. Nonobstructing stone in the right kidney. No evidence of bowel obstruction or inflammation. Electronically Signed   By: Burman Nieves M.D.   On: 05/12/2015 22:42   Ct Abdomen Pelvis W Contrast  05/12/2015  CLINICAL DATA:   Left-sided chest pain beginning today. Nausea, vomiting, and diarrhea. History of kidney stones. Shortness of breath. Right knee replacement surgery 2 weeks ago. EXAM: CT ANGIOGRAPHY CHEST CT ABDOMEN AND PELVIS WITH CONTRAST TECHNIQUE: Multidetector CT imaging of the chest was performed using the standard protocol during bolus administration of intravenous contrast. Multiplanar CT image reconstructions and MIPs were obtained to evaluate the vascular anatomy. Multidetector CT imaging of the abdomen and pelvis was performed using the standard protocol during bolus administration of intravenous contrast. CONTRAST:  175 mL Isovue 370. Patient was initially injected with 100 mL, resulting in suboptimal opacification of the pulmonary arteries. Patient was reinjected 75 mL and rescanned. COMPARISON:  02/01/2007 FINDINGS: CTA CHEST FINDINGS Technically adequate study with moderately good opacification of the central and segmental pulmonary arteries. No focal filling defects are demonstrated. No evidence of significant pulmonary embolus. Normal heart size. Moderate size pericardial effusion. Coronary artery calcifications. Normal caliber thoracic aorta. Great vessel origins are patent. No evidence of aortic dissection. Scattered lymph nodes in the mediastinum and hilar regions are not pathologically enlarged. The esophagus is decompressed. The mild emphysematous changes in the upper lungs. No focal airspace disease or consolidation. No pleural effusions. No pneumothorax. Airways appear patent. CT ABDOMEN and PELVIS FINDINGS The liver, spleen, gallbladder, pancreas, adrenal glands, abdominal aorta, inferior vena cava, and retroperitoneal lymph nodes are unremarkable. Nonobstructing stone in the midpole of the right kidney. No hydronephrosis or hydroureter. Renal nephrograms are symmetrical. Stomach, small bowel, and colon are not abnormally distended. No free air or free fluid in the abdomen. Small umbilical hernia  containing fat. Pelvis: The appendix is normal. Bladder wall is not thickened. Prostate gland is  mildly enlarged at 3.7 cm diameter. No free or loculated pelvic fluid collections. No pelvic mass or lymphadenopathy. No evidence of diverticulitis. No destructive bone lesions. Review of the MIP images confirms the above findings. IMPRESSION: No evidence of significant pulmonary embolus. Moderate-sized pericardial effusion. Nonobstructing stone in the right kidney. No evidence of bowel obstruction or inflammation. Electronically Signed   By: Burman Nieves M.D.   On: 05/12/2015 22:42    ASSESSMENT AND PLAN:   Active Problems:   Chest pain at rest   Dyspnea   SIRS (systemic inflammatory response syndrome) (HCC)   Pericarditis with effusion  * Pericardial effusion.   Appreciated cardiology help.   Colchicine and Aspirin for 2 weeks.   MOnitor in clinic with follow up echo in 1 mth.  * Chest pain- likely due to pericardial effusion, not anginal type.  * SIRS with leukocytosis   Likely due to viral syndrome, as he also had nausea, diarrhoea.    Check stool studies, and stop ABx.  All the records are reviewed and case discussed with Care Management/Social Workerr. Management plans discussed with the patient, family and they are in agreement.  CODE STATUS: Full  TOTAL TIME TAKING CARE OF THIS PATIENT: 35 minutes.     POSSIBLE D/C IN 1-2 DAYS, DEPENDING ON CLINICAL CONDITION.   Altamese Dilling M.D on 05/13/2015   Between 7am to 6pm - Pager - [phone]  After 6pm go to [email or web address] - Social research officer, government  [facility]  Office  [phone]  CC: Primary care physician; [facility]  Note: This dictation was prepared with Dragon dictation along with smaller phrase technology. Any transcriptional errors that result from this process are unintentional.

## 2015-05-13 NOTE — Progress Notes (Signed)
Initial Nutrition Assessment   INTERVENTION:   -Cater to pt preferences -Recommend Ensure Enlive po BID, each supplement provides 350 kcal and 20 grams of protein -Recommend checking new weight as RD noted the left side of bed scale did not appear to be working correctly on visit as all lights were flashing.    NUTRITION DIAGNOSIS:   Inadequate oral intake related to poor appetite, acute illness as evidenced by per patient/family report.  GOAL:   Patient will meet greater than or equal to 90% of their needs  MONITOR:   Supplement acceptance, PO intake, Labs, I & O's, Weight trends  REASON FOR ASSESSMENT:   Malnutrition Screening Tool    ASSESSMENT:   Pt admitted with CP and SOB, found out to have pericardial effusion per MD note. Pt with recent h/o right knee replacement 2 weeks ago.  Past Medical History  Diagnosis Date  . Arthritis     knees and hands  . Chronic kidney disease     kidney stones  . Depression   . Restless leg syndrome   . Bipolar disorder (HCC)   . Anxiety   . PTSD (post-traumatic stress disorder)   . GERD (gastroesophageal reflux disease)   . Syncope   . Hepatitis     HEP "C"    Diet Order:  Diet 2 gram sodium Room service appropriate?: Yes; Fluid consistency:: Thin   Pt reports eating bites of lunch today not wanting to eat. Pt reports poor po intake since last admission 2 weeks ago. Pt reports not eating breakfast and when he eats lunch or dinner its usually bites at best. Pt not drinking any kind of supplement but wanting to try.  Medications: NS at 2775mL/hr, Zofran Labs: reviewed    Gastrointestinal Profile: Last BM:  05/12/2015   Nutrition-Focused Physical Exam Findings: Nutrition-Focused physical exam completed. Findings are WDL for fat depletion, muscle depletion, and edema.    Weight Change: Pt reports weight of 204lbs 2 weeks ago on admission and that is current measured weight is 174lbs. RD also notes weight of 185lbs on  04/15/2015 and 184lbs one year ago.     Skin:  Reviewed, no issues   Height:   Ht Readings from Last 1 Encounters:  05/13/15 6' (1.829 m)    Weight:   Wt Readings from Last 1 Encounters:  05/13/15 174 lb 8 oz (79.153 kg)    Wt Readings from Last 10 Encounters:  05/13/15 174 lb 8 oz (79.153 kg)  04/22/15 208 lb 9.6 oz (94.62 kg)  04/15/15 185 lb (83.915 kg)  06/25/14 184 lb (83.462 kg)  05/28/14 180 lb (81.647 kg)    BMI:  Body mass index is 23.66 kg/(m^2).  Estimated Nutritional Needs:   Kcal:  1975-2350kcals  Protein:  79-94g protein  Fluid:  2-2.2L fluid  EDUCATION NEEDS:   No education needs identified at this time  Leda QuailAllyson Vincenza Dail, RD, LDN Pager [phone] Weekend/On-Call Pager [phone]

## 2015-05-13 NOTE — Consult Note (Signed)
Cardiology Consultation Note  Patient ID: [name], MRN: [id number], DOB/AGE: 1960-06-23 55 y.o. Admit date: 05/12/2015   Date of Consult: 05/13/2015    Primary Physician: [facility] Primary Cardiologist: new to [facility]  Chief Complaint: Chest pain, fever, chills, nausea, vomiting, and diarrhea Reason for Consult: Pericardial effusion seen on CTA of chest  HPI: 55 y.o. male with h/o reported CKD with normal renal function by all documented labs, hepatitis C, bipolar disorder, renal stones, GERD, and osteoarthritis who presented to [facility] on 5/2 with inspiratory chest pain with preceding fever, chills, nausea, vomiting, and diarrhea.  He has no previously known cardiac history and has never seen a cardiologist. Patient developed central to left-sided chest pain on 5/2 while at rest. His pain is worse with deep inspiration, though there is no change when supine vs sitting. He also notes subjective fever and chills. There has been associated nausea, vomiting, and diarrhea that preceded his symptoms by 4 days which have persisted into 5/2. No appetite. No orthopnea, LE edema, or early satiety. Never a smoker. Used to smoke marijuana years ago, though denies any other illegal drugs. No ETOH. His mother passed from a stroke in 2005.   Upon the patient's arrival to [facility] they were found to have a troponin of 0.04-->negative x 2, WBC 15.0-->.10.9-->8.7, K+ 4.1. ECG as below, CTA chest/abdomen/pelvis showed no evidence of PE, moderate-sized pericardial effusion, nonobstructing right renal stone, no evidence of bowel obstruction or inflammation. He was placed on Rocephin and azithromycin overnight. He continues to note central to left-sided chest pain at this time.   Past Medical History  Diagnosis Date  . Arthritis     knees and hands  . Chronic kidney disease     kidney stones  . Depression   . Restless leg syndrome   . Bipolar disorder (HCC)   . Anxiety   . PTSD (post-traumatic  stress disorder)   . GERD (gastroesophageal reflux disease)   . Syncope   . Hepatitis     HEP "C"      Most Recent Cardiac Studies: Echo pending   Surgical History:  Past Surgical History  Procedure Laterality Date  . Cystoscopy with ureteroscopy and stent placement    . Knee arthroscopy Right 06/25/2014    Procedure: ARTHROSCOPY KNEE;  Surgeon: Deeann Saint, MD;  Location: [facility] ORS;  Service: Orthopedics;  Laterality: Right;  partial arthroscopic medial menisectomy  . Joint replacement    . Total knee arthroplasty Right 04/22/2015    Procedure: TOTAL KNEE ARTHROPLASTY;  Surgeon: Deeann Saint, MD;  Location: [facility] ORS;  Service: Orthopedics;  Laterality: Right;     Home Meds: Prior to Admission medications   Medication Sig Start Date End Date Taking? Authorizing Provider  benztropine (COGENTIN) 0.5 MG tablet Take 1 tablet (0.5 mg total) by mouth 2 (two) times daily as needed for tremors. 04/25/15  Yes Deeann Saint, MD  divalproex (DEPAKOTE) 500 MG DR tablet Take 500 mg by mouth 2 (two) times daily.   Yes Historical Provider, MD  enalapril (VASOTEC) 10 MG tablet Take 10 mg by mouth daily.   Yes Historical Provider, MD  FLUoxetine (PROZAC) 20 MG capsule Take 20 mg by mouth daily.   Yes Historical Provider, MD  gabapentin (NEURONTIN) 400 MG capsule Take 1 capsule (400 mg total) by mouth 3 (three) times daily. 04/25/15  Yes Deeann Saint, MD  HYDROcodone-acetaminophen (NORCO) 7.5-325 MG tablet Take 1 tablet by mouth every 6 (six) hours as needed for  moderate pain. 04/25/15  Yes Deeann Saint, MD  meloxicam (MOBIC) 15 MG tablet Take 1 tablet (15 mg total) by mouth daily. 04/25/15  Yes Deeann Saint, MD  methocarbamol (ROBAXIN) 500 MG tablet Take 1 tablet (500 mg total) by mouth every 6 (six) hours as needed for muscle spasms. 04/25/15  Yes Deeann Saint, MD  QUEtiapine (SEROQUEL) 50 MG tablet Take 50 mg by mouth 4 (four) times daily as needed (for agitation).    Yes Historical Provider, MD    SEROQUEL XR 400 MG 24 hr tablet Take 400 mg by mouth at bedtime.    Yes Historical Provider, MD  tamsulosin (FLOMAX) 0.4 MG CAPS capsule Take 0.4 mg by mouth daily after breakfast.   Yes Historical Provider, MD  traMADol (ULTRAM) 50 MG tablet Take 50 mg by mouth every 6 (six) hours as needed for moderate pain.   Yes Historical Provider, MD    Inpatient Medications:  . aspirin EC  81 mg Oral Daily  . aspirin  650 mg Oral Daily  . colchicine  0.6 mg Oral BID  . divalproex  500 mg Oral BID  . enalapril  10 mg Oral Daily  . enoxaparin (LOVENOX) injection  40 mg Subcutaneous Q24H  . FLUoxetine  20 mg Oral Daily  . gabapentin  400 mg Oral TID  . metoprolol tartrate  25 mg Oral BID  . ondansetron (ZOFRAN) IV  4 mg Intravenous Q6H  . QUEtiapine  400 mg Oral QHS  . tamsulosin  0.4 mg Oral QPC breakfast   . sodium chloride 75 mL/hr at 05/13/15 1231    Allergies:  Allergies  Allergen Reactions  . Toradol [Ketorolac Tromethamine] Hives    Social History   Social History  . Marital Status: Single    Spouse Name: N/A  . Number of Children: N/A  . Years of Education: N/A   Occupational History  . Not on file.   Social History Main Topics  . Smoking status: Former Smoker -- 1.00 packs/day    Types: Cigarettes    Quit date: 05/16/1984  . Smokeless tobacco: Never Used  . Alcohol Use: No  . Drug Use: No  . Sexual Activity: Not on file   Other Topics Concern  . Not on file   Social History Narrative     Family History  Problem Relation Age of Onset  . CVA Mother     deceased     Review of Systems: Review of Systems  Constitutional: Positive for fever, chills, weight loss, malaise/fatigue and diaphoresis.  HENT: Negative for congestion and sore throat.   Eyes: Negative for discharge and redness.  Respiratory: Positive for cough and shortness of breath. Negative for hemoptysis, sputum production and wheezing.   Cardiovascular: Positive for chest pain. Negative for  palpitations, orthopnea, claudication, leg swelling and PND.  Gastrointestinal: Positive for nausea, vomiting and diarrhea. Negative for heartburn, abdominal pain, constipation, blood in stool and melena.  Genitourinary: Negative for hematuria.  Musculoskeletal: Positive for myalgias and joint pain. Negative for falls.  Skin: Negative for rash.  Neurological: Positive for weakness. Negative for dizziness, tingling, tremors, sensory change, speech change, focal weakness, loss of consciousness and headaches.  Endo/Heme/Allergies: Does not bruise/bleed easily.  Psychiatric/Behavioral: Negative for substance abuse. The patient is not nervous/anxious.   All other systems reviewed and are negative.   Labs:  Recent Labs  05/12/15 1942 05/13/15 0121 05/13/15 0727 05/13/15 1329  TROPONINI 0.04* <0.03 <0.03 <0.03   Lab Results  Component Value Date  WBC 8.7 05/13/2015   HGB 10.2* 05/13/2015   HCT 30.1* 05/13/2015   MCV 85.7 05/13/2015   PLT 539* 05/13/2015    Recent Labs Lab 05/12/15 1942  05/13/15 0727  NA 137  --  138  K 3.9  --  4.1  CL 103  --  106  CO2 16*  --  21*  BUN 19  --  15  CREATININE 1.06  < > 0.97  CALCIUM 10.2  --  9.3  PROT 8.4*  --   --   BILITOT 0.7  --   --   ALKPHOS 82  --   --   ALT 16*  --   --   AST 22  --   --   GLUCOSE 122*  --  122*  < > = values in this interval not displayed. No results found for: CHOL, HDL, LDLCALC, TRIG No results found for: DDIMER  Radiology/Studies:  Ct Angio Chest Pe W/cm &/or Wo Cm  05/12/2015  CLINICAL DATA:  Left-sided chest pain beginning today. Nausea, vomiting, and diarrhea. History of kidney stones. Shortness of breath. Right knee replacement surgery 2 weeks ago. EXAM: CT ANGIOGRAPHY CHEST CT ABDOMEN AND PELVIS WITH CONTRAST TECHNIQUE: Multidetector CT imaging of the chest was performed using the standard protocol during bolus administration of intravenous contrast. Multiplanar CT image reconstructions and MIPs were  obtained to evaluate the vascular anatomy. Multidetector CT imaging of the abdomen and pelvis was performed using the standard protocol during bolus administration of intravenous contrast. CONTRAST:  175 mL Isovue 370. Patient was initially injected with 100 mL, resulting in suboptimal opacification of the pulmonary arteries. Patient was reinjected 75 mL and rescanned. COMPARISON:  02/01/2007 FINDINGS: CTA CHEST FINDINGS Technically adequate study with moderately good opacification of the central and segmental pulmonary arteries. No focal filling defects are demonstrated. No evidence of significant pulmonary embolus. Normal heart size. Moderate size pericardial effusion. Coronary artery calcifications. Normal caliber thoracic aorta. Great vessel origins are patent. No evidence of aortic dissection. Scattered lymph nodes in the mediastinum and hilar regions are not pathologically enlarged. The esophagus is decompressed. The mild emphysematous changes in the upper lungs. No focal airspace disease or consolidation. No pleural effusions. No pneumothorax. Airways appear patent. CT ABDOMEN and PELVIS FINDINGS The liver, spleen, gallbladder, pancreas, adrenal glands, abdominal aorta, inferior vena cava, and retroperitoneal lymph nodes are unremarkable. Nonobstructing stone in the midpole of the right kidney. No hydronephrosis or hydroureter. Renal nephrograms are symmetrical. Stomach, small bowel, and colon are not abnormally distended. No free air or free fluid in the abdomen. Small umbilical hernia containing fat. Pelvis: The appendix is normal. Bladder wall is not thickened. Prostate gland is mildly enlarged at 3.7 cm diameter. No free or loculated pelvic fluid collections. No pelvic mass or lymphadenopathy. No evidence of diverticulitis. No destructive bone lesions. Review of the MIP images confirms the above findings. IMPRESSION: No evidence of significant pulmonary embolus. Moderate-sized pericardial effusion.  Nonobstructing stone in the right kidney. No evidence of bowel obstruction or inflammation. Electronically Signed   By: Burman Nieves M.D.   On: 05/12/2015 22:42   Ct Abdomen Pelvis W Contrast  05/12/2015  CLINICAL DATA:  Left-sided chest pain beginning today. Nausea, vomiting, and diarrhea. History of kidney stones. Shortness of breath. Right knee replacement surgery 2 weeks ago. EXAM: CT ANGIOGRAPHY CHEST CT ABDOMEN AND PELVIS WITH CONTRAST TECHNIQUE: Multidetector CT imaging of the chest was performed using the standard protocol during bolus administration of intravenous contrast. Multiplanar  CT image reconstructions and MIPs were obtained to evaluate the vascular anatomy. Multidetector CT imaging of the abdomen and pelvis was performed using the standard protocol during bolus administration of intravenous contrast. CONTRAST:  175 mL Isovue 370. Patient was initially injected with 100 mL, resulting in suboptimal opacification of the pulmonary arteries. Patient was reinjected 75 mL and rescanned. COMPARISON:  02/01/2007 FINDINGS: CTA CHEST FINDINGS Technically adequate study with moderately good opacification of the central and segmental pulmonary arteries. No focal filling defects are demonstrated. No evidence of significant pulmonary embolus. Normal heart size. Moderate size pericardial effusion. Coronary artery calcifications. Normal caliber thoracic aorta. Great vessel origins are patent. No evidence of aortic dissection. Scattered lymph nodes in the mediastinum and hilar regions are not pathologically enlarged. The esophagus is decompressed. The mild emphysematous changes in the upper lungs. No focal airspace disease or consolidation. No pleural effusions. No pneumothorax. Airways appear patent. CT ABDOMEN and PELVIS FINDINGS The liver, spleen, gallbladder, pancreas, adrenal glands, abdominal aorta, inferior vena cava, and retroperitoneal lymph nodes are unremarkable. Nonobstructing stone in the midpole  of the right kidney. No hydronephrosis or hydroureter. Renal nephrograms are symmetrical. Stomach, small bowel, and colon are not abnormally distended. No free air or free fluid in the abdomen. Small umbilical hernia containing fat. Pelvis: The appendix is normal. Bladder wall is not thickened. Prostate gland is mildly enlarged at 3.7 cm diameter. No free or loculated pelvic fluid collections. No pelvic mass or lymphadenopathy. No evidence of diverticulitis. No destructive bone lesions. Review of the MIP images confirms the above findings. IMPRESSION: No evidence of significant pulmonary embolus. Moderate-sized pericardial effusion. Nonobstructing stone in the right kidney. No evidence of bowel obstruction or inflammation. Electronically Signed   By: Burman Nieves M.D.   On: 05/12/2015 22:42   Dg Knee Right Port  04/22/2015  CLINICAL DATA: Total knee replacement. EXAM: PORTABLE RIGHT KNEE - 1-2 VIEW COMPARISON:  MRI 05/28/2014. FINDINGS: Total right knee replacement. Hardware intact. Good anatomic alignment. No acute bony abnormality . IMPRESSION: Total right knee replacement with good anatomic alignment. Electronically Signed   By: Maisie Fus  Register   On: 04/22/2015 10:49    EKG: sinus tachycardia, 121 bpm, possible electrical alternans on rhythm strip, nonspecific lateral st/t changes   Weights: Filed Weights   05/12/15 1903 05/13/15 0114  Weight: 180 lb (81.647 kg) 174 lb 8 oz (79.153 kg)     Physical Exam: Blood pressure 115/80, pulse 74, temperature 98 F (36.7 C), temperature source Oral, resp. rate 18, height 6' (1.829 m), weight 174 lb 8 oz (79.153 kg), SpO2 97 %. Body mass index is 23.66 kg/(m^2). General: Well developed, well nourished, in no acute distress. Head: Normocephalic, atraumatic, sclera non-icteric, no xanthomas, nares are without discharge.  Neck: Negative for carotid bruits. JVD not elevated. Lungs: Clear bilaterally to auscultation without wheezes, rales, or rhonchi.  Breathing is unlabored. Heart: RRR with S1 S2. No murmurs, rubs, or gallops appreciated. Abdomen: Soft, non-tender, non-distended with normoactive bowel sounds. No hepatomegaly. No rebound/guarding. No obvious abdominal masses. Msk:  Strength and tone appear normal for age. Extremities: No clubbing or cyanosis. No edema.  Distal pedal pulses are 2+ and equal bilaterally. Neuro: Alert and oriented X 3. No facial asymmetry. No focal deficit. Moves all extremities spontaneously. Psych:  Responds to questions appropriately with a normal affect.    Assessment and Plan:   1. Presumed pericarditis With effusion: -Patient presented with 4 day preceding fever, chills, nausea, vomiting, and diarrhea -Now with central to  left-sided chest pain that is worse with deep inspiration  -CTA chest showed moderate-sized pericardial effusion -Possible electrical alternans on EKG -Check echo to evaluate pericardial effusion and for any hemodynamic compromise -Start colchicine 0.6 mg bid and high-dose aspirin 650 mg daily (NSAID allergy) -IM to evaluate possible underlying infection, possible viral   2. Reported renal dysfunction: -Labs document normal renal function   3. N/V/D/F/C: -As above   Signed, Eula Listenyan Dunn, PA-C Pager: [phone] 05/13/2015, 6:57 PM  I have seen, examined and evaluated the patient this Afternoon along with Eula Listenyan Dunn, PA-C.Marland Kitchen.  After reviewing all the available data and chart,  I agree with his findings, examination as well as impression recommendations as discussed.  Interesting situation, we have a gentleman without significant cardiac risk factors who now presents with a conglomeration of symptoms is worrisome for some viral syndrome including low-grade fever vomiting diarrhea lethargy and malaise. Is also associated with left-sided chest discomfort and pain that is exacerbated with deep inspiration. He says that he also notes a discomfort in his throat when he takes a deep breath  in. He had a mild elevation in white blood cell count that would somewhat increased with the concept of a systemic viral infection.   Echo results as follows: Normal EF 60-65%. Moderate size circumferential pericardial effusion without evidence of tamponade physiology.  He had a CT scan of the chest which suggests a possible pericardial effusion which was evaluated with an echocardiogram. Results are pending, but on my review there does appear to be a moderate sized pericardial effusion is mostly posterior (difficult to approach percutaneously) without any evidence of tamponade physiology. Exam does not reveal anything significant other than some discomfort with abdominal palpation and most notably left upper quadrant. I did not hear with the exception of very briefly at the left lower sternal border.  I agree that time with the finding of pericardial effusion and some mild Lipitor is on EKG and what sounds like pericarditic/pleuritic-type discomfort in the chest, the pericarditis is a relatively reasonable suggestion to explain his chest pain. It is possible that with some type of viral syndrome he has a mild myopericarditis. He was insensate allergy, cannot use ibuprofen or indomethacin. Will start treatment with colchicine and aspirin and monitor renal function. Continue supportive treatment for what sounds like a viral syndrome.  Would treat with colchicine and high-dose aspirin for at least 2 weeks. Then continue colchicine. One month. Would then reassess with echocardiogram in a month or 2 to confirm that the effusion has reduced in size.    Marykay LexHARDING, DAVID W, M.D., M.S. Interventional Cardiologist   Pager # [phone] Phone # [phone] [address] [place], [place] [address]

## 2015-05-13 NOTE — Care Management (Addendum)
Patient admitted with chest pain and shortness of breath.  Found to have pericardial effusion w Ruled out for pulmonary embolus.   Cardiology consult is pending.  Patient with knee replacement 2 weeks ago and is receiving physical therapy in the outpatient setting- not home health.  Discussed physical therapy consult to prevent decline in joint rehab unless it is medically contraindicated.

## 2015-05-13 NOTE — Progress Notes (Signed)
*  PRELIMINARY RESULTS* Echocardiogram 2D Echocardiogram has been performed.  Georgann HousekeeperJerry R Hege 05/13/2015, 2:02 PM

## 2015-05-13 NOTE — Progress Notes (Signed)
Pharmacy Antibiotic Note  [name] is a 55 y.o. male admitted on 05/12/2015 with SIRS.  Pharmacy has been consulted for ceftriaxone dosing.  Plan: Ceftriaxone 2 grams q 24 hours ordered.  Height: 6' (182.9 cm) Weight: 180 lb (81.647 kg) IBW/kg (Calculated) : 77.6  Temp (24hrs), Avg:97.9 F (36.6 C), Min:97.9 F (36.6 C), Max:97.9 F (36.6 C)   Recent Labs Lab 05/12/15 1942  WBC 15.0*  CREATININE 1.06    Estimated Creatinine Clearance: 89.5 mL/min (by C-G formula based on Cr of 1.06).    Allergies  Allergen Reactions  . Toradol [Ketorolac Tromethamine] Hives    Antimicrobials this admission: ceftriaxone  >>    >>   Dose adjustments this admission:   Microbiology results: No micro   5/3 UA pending  Thank you for allowing pharmacy to be a part of this patient's care.  Idelle Reimann S 05/13/2015 12:54 AM

## 2015-05-13 NOTE — Progress Notes (Signed)
Pt c/o of 7/10 pain of L flank, PO pain medication not due at this time. MD Sheryle Hailiamond notified. MD to place orders.   Mayra NeerNesbitt, Angelise Petrich M

## 2015-05-14 LAB — C DIFFICILE QUICK SCREEN W PCR REFLEX
C DIFFICILE (CDIFF) INTERP: NEGATIVE
C Diff antigen: NEGATIVE
C Diff toxin: NEGATIVE

## 2015-05-14 MED ORDER — PANTOPRAZOLE SODIUM 40 MG PO TBEC: 40.0000 mg | DELAYED_RELEASE_TABLET | Freq: Every day | ORAL | Status: DC

## 2015-05-14 MED ORDER — ASPIRIN 325 MG PO TABS: 650.0000 mg | ORAL_TABLET | Freq: Every day | ORAL | Status: DC

## 2015-05-14 MED ORDER — IBUPROFEN 400 MG PO TABS: 800.0000 mg | ORAL_TABLET | Freq: Three times a day (TID) | ORAL | Status: DC

## 2015-05-14 MED ORDER — IBUPROFEN 800 MG PO TABS: 800.0000 mg | ORAL_TABLET | Freq: Three times a day (TID) | ORAL | Status: DC

## 2015-05-14 MED ORDER — COLCHICINE 0.6 MG PO TABS: 0.6000 mg | ORAL_TABLET | Freq: Two times a day (BID) | ORAL | Status: DC

## 2015-05-14 MED ORDER — ASPIRIN 325 MG PO TABS
650.0000 mg | ORAL_TABLET | Freq: Every day | ORAL | Status: DC
  Administered 2015-05-14: 650 mg via ORAL
  Filled 2015-05-14: qty 2

## 2015-05-14 MED ORDER — PANTOPRAZOLE SODIUM 40 MG PO TBEC
40.0000 mg | DELAYED_RELEASE_TABLET | Freq: Every day | ORAL | Status: DC
  Administered 2015-05-14: 40 mg via ORAL
  Filled 2015-05-14: qty 1

## 2015-05-14 NOTE — Discharge Summary (Addendum)
[facility] [place] [facility]   PATIENT NAME: [name]    MR#:  [id number]  DATE OF BIRTH:  November 21, 1960  DATE OF ADMISSION:  05/12/2015 ADMITTING PHYSICIAN: Ihor Austin, MD  DATE OF DISCHARGE: 05/14/2015  PRIMARY CARE PHYSICIAN: [facility]    ADMISSION DIAGNOSIS:  Pericardial effusion [I31.9] Elevated troponin [R79.89]  DISCHARGE DIAGNOSIS:  Active Problems:   Chest pain at rest   Dyspnea   SIRS (systemic inflammatory response syndrome) (HCC)   Pericarditis with effusion   SECONDARY DIAGNOSIS:   Past Medical History  Diagnosis Date  . Arthritis     knees and hands  . Chronic kidney disease     kidney stones  . Depression   . Restless leg syndrome   . Bipolar disorder (HCC)   . Anxiety   . PTSD (post-traumatic stress disorder)   . GERD (gastroesophageal reflux disease)   . Syncope   . Hepatitis     HEP "C"    HOSPITAL COURSE:    55 year old male with past medical history significant for hepatitis C, bipolar disorder, renal stones, GERD and osteoarthritis who had right knee replacement surgery done 3 weeks ago presents to the hospital secondary to chest pain and viral gastroenteritis symptoms.  #1 chest pain-secondary to pericarditis associated with moderate pericardial effusion. -Troponins are negative. CT of the chest showing pericardial effusion but no pulmonary embolism. -Echocardiogram with moderate pericardial effusion with no tamponade noted. -Vitals are stable. Started on colchicine and also high-dose aspirin. -patient has rash reaction to toradol- though took motrin the past- will avoid it for now -Will be discharged on colchicine and also high dose aspirin for 2 weeks. Added a protonix with high dose aspirin. - He can follow up with cardiology in 2-3 weeks for repeat echocardiogram to see improvement in pericardial effusion. -Ambulate today and if stable can be discharged. -Appreciate cardiology  consult  #2 hypertension-continue enalapril  #3 bipolar disorder-stable. Continue home medications. Patient on Depakote, Seroquel, Prozac and Cogentin  #4 Osteoarthritis and chronic pain-continue home pain medications.  Ambulate today, if stable can be discharged home   DISCHARGE CONDITIONS:   Stable  CONSULTS OBTAINED:   cardiology consult by [facility]  DRUG ALLERGIES:   Allergies  Allergen Reactions  . Toradol [Ketorolac Tromethamine] Hives    DISCHARGE MEDICATIONS:   Current Discharge Medication List    START taking these medications   Details  colchicine 0.6 MG tablet Take 1 tablet (0.6 mg total) by mouth 2 (two) times daily. X 2 weeks Qty: 28 tablet, Refills: 00    ibuprofen (ADVIL,MOTRIN) 800 MG tablet Take 1 tablet (800 mg total) by mouth 3 (three) times daily. X 1 week scheduled and then change to OTC as needed  TID PRN only for chest pain Qty: 21 tablet, Refills: 0    pantoprazole (PROTONIX) 40 MG tablet Take 1 tablet (40 mg total) by mouth daily. Qty: 30 tablet, Refills: 2      CONTINUE these medications which have NOT CHANGED   Details  benztropine (COGENTIN) 0.5 MG tablet Take 1 tablet (0.5 mg total) by mouth 2 (two) times daily as needed for tremors. Qty: 30 tablet, Refills: 2    divalproex (DEPAKOTE) 500 MG DR tablet Take 500 mg by mouth 2 (two) times daily.    enalapril (VASOTEC) 10 MG tablet Take 10 mg by mouth daily.    FLUoxetine (PROZAC) 20 MG capsule Take 20 mg by mouth daily.    gabapentin (  NEURONTIN) 400 MG capsule Take 1 capsule (400 mg total) by mouth 3 (three) times daily. Qty: 60 capsule, Refills: 3    HYDROcodone-acetaminophen (NORCO) 7.5-325 MG tablet Take 1 tablet by mouth every 6 (six) hours as needed for moderate pain. Qty: 50 tablet, Refills: 0    methocarbamol (ROBAXIN) 500 MG tablet Take 1 tablet (500 mg total) by mouth every 6 (six) hours as needed for muscle spasms. Qty: 60 tablet, Refills: 2    QUEtiapine  (SEROQUEL) 50 MG tablet Take 50 mg by mouth 4 (four) times daily as needed (for agitation).     SEROQUEL XR 400 MG 24 hr tablet Take 400 mg by mouth at bedtime.  Refills: 2    tamsulosin (FLOMAX) 0.4 MG CAPS capsule Take 0.4 mg by mouth daily after breakfast.    traMADol (ULTRAM) 50 MG tablet Take 50 mg by mouth every 6 (six) hours as needed for moderate pain.      STOP taking these medications     meloxicam (MOBIC) 15 MG tablet          DISCHARGE INSTRUCTIONS:   1. PCP f/u in 1-2 weeks 2. Cardiology f/u in 2 weeks  If you experience worsening of your admission symptoms, develop shortness of breath, life threatening emergency, suicidal or homicidal thoughts you must seek medical attention immediately by calling 911 or calling your MD immediately  if symptoms less severe.  You Must read complete instructions/literature along with all the possible adverse reactions/side effects for all the Medicines you take and that have been prescribed to you. Take any new Medicines after you have completely understood and accept all the possible adverse reactions/side effects.   Please note  You were cared for by a hospitalist during your hospital stay. If you have any questions about your discharge medications or the care you received while you were in the hospital after you are discharged, you can call the unit and asked to speak with the hospitalist on call if the hospitalist that took care of you is not available. Once you are discharged, your primary care physician will handle any further medical issues. Please note that NO REFILLS for any discharge medications will be authorized once you are discharged, as it is imperative that you return to your primary care physician (or establish a relationship with a primary care physician if you do not have one) for your aftercare needs so that they can reassess your need for medications and monitor your lab values.    Today   CHIEF COMPLAINT:   Chief  Complaint  Patient presents with  . Chest Pain  . Nausea  . Emesis  . Diarrhea  . Flank Pain    VITAL SIGNS:  Blood pressure 98/63, pulse 94, temperature 98 F (36.7 C), temperature source Oral, resp. rate 18, height 6' (1.829 m), weight 79.153 kg (174 lb 8 oz), SpO2 96 %.  I/O:   Intake/Output Summary (Last 24 hours) at 05/14/15 0828 Last data filed at 05/14/15 0411  Gross per 24 hour  Intake 1838.75 ml  Output    125 ml  Net 1713.75 ml    PHYSICAL EXAMINATION:   Physical Exam  GENERAL:  56 y.o.-year-old patient lying in the bed with no acute distress.  EYES: Pupils equal, round, reactive to light and accommodation. No scleral icterus. Extraocular muscles intact.  HEENT: Head atraumatic, normocephalic. Oropharynx and nasopharynx clear.  NECK:  Supple, no jugular venous distention. No thyroid enlargement, no tenderness.  LUNGS: Normal breath sounds  bilaterally, no wheezing, rales,rhonchi or crepitation. No use of accessory muscles of respiration.  CARDIOVASCULAR: S1, S2 normal. No murmurs, rubs, or gallops.  ABDOMEN: Soft, non-tender, non-distended. Bowel sounds present. No organomegaly or mass.  EXTREMITIES: No pedal edema, cyanosis, or clubbing. Right knee with recent surgical scar, clean and staples taken out. NEUROLOGIC: Cranial nerves II through XII are intact. Muscle strength 5/5 in all extremities. Sensation intact. Gait not checked.  PSYCHIATRIC: The patient is alert and oriented x 3.  SKIN: No obvious rash, lesion, or ulcer.   DATA REVIEW:   CBC  Recent Labs Lab 05/13/15 0727  WBC 8.7  HGB 10.2*  HCT 30.1*  PLT 539*    Chemistries   Recent Labs Lab 05/12/15 1942  05/13/15 0727  NA 137  --  138  K 3.9  --  4.1  CL 103  --  106  CO2 16*  --  21*  GLUCOSE 122*  --  122*  BUN 19  --  15  CREATININE 1.06  < > 0.97  CALCIUM 10.2  --  9.3  AST 22  --   --   ALT 16*  --   --   ALKPHOS 82  --   --   BILITOT 0.7  --   --   < > = values in this  interval not displayed.  Cardiac Enzymes  Recent Labs Lab 05/13/15 1329  TROPONINI <0.03    Microbiology Results  Results for orders placed or performed during the hospital encounter of 05/12/15  C difficile quick scan w PCR reflex     Status: None   Collection Time: 05/14/15 12:54 AM  Result Value Ref Range Status   C Diff antigen NEGATIVE NEGATIVE Final   C Diff toxin NEGATIVE NEGATIVE Final   C Diff interpretation Negative for C. difficile  Final    RADIOLOGY:  Ct Angio Chest Pe W/cm &/or Wo Cm  05/12/2015  CLINICAL DATA:  Left-sided chest pain beginning today. Nausea, vomiting, and diarrhea. History of kidney stones. Shortness of breath. Right knee replacement surgery 2 weeks ago. EXAM: CT ANGIOGRAPHY CHEST CT ABDOMEN AND PELVIS WITH CONTRAST TECHNIQUE: Multidetector CT imaging of the chest was performed using the standard protocol during bolus administration of intravenous contrast. Multiplanar CT image reconstructions and MIPs were obtained to evaluate the vascular anatomy. Multidetector CT imaging of the abdomen and pelvis was performed using the standard protocol during bolus administration of intravenous contrast. CONTRAST:  175 mL Isovue 370. Patient was initially injected with 100 mL, resulting in suboptimal opacification of the pulmonary arteries. Patient was reinjected 75 mL and rescanned. COMPARISON:  02/01/2007 FINDINGS: CTA CHEST FINDINGS Technically adequate study with moderately good opacification of the central and segmental pulmonary arteries. No focal filling defects are demonstrated. No evidence of significant pulmonary embolus. Normal heart size. Moderate size pericardial effusion. Coronary artery calcifications. Normal caliber thoracic aorta. Great vessel origins are patent. No evidence of aortic dissection. Scattered lymph nodes in the mediastinum and hilar regions are not pathologically enlarged. The esophagus is decompressed. The mild emphysematous changes in the upper  lungs. No focal airspace disease or consolidation. No pleural effusions. No pneumothorax. Airways appear patent. CT ABDOMEN and PELVIS FINDINGS The liver, spleen, gallbladder, pancreas, adrenal glands, abdominal aorta, inferior vena cava, and retroperitoneal lymph nodes are unremarkable. Nonobstructing stone in the midpole of the right kidney. No hydronephrosis or hydroureter. Renal nephrograms are symmetrical. Stomach, small bowel, and colon are not abnormally distended. No free air or free  fluid in the abdomen. Small umbilical hernia containing fat. Pelvis: The appendix is normal. Bladder wall is not thickened. Prostate gland is mildly enlarged at 3.7 cm diameter. No free or loculated pelvic fluid collections. No pelvic mass or lymphadenopathy. No evidence of diverticulitis. No destructive bone lesions. Review of the MIP images confirms the above findings. IMPRESSION: No evidence of significant pulmonary embolus. Moderate-sized pericardial effusion. Nonobstructing stone in the right kidney. No evidence of bowel obstruction or inflammation. Electronically Signed   By: Burman Nieves M.D.   On: 05/12/2015 22:42   Ct Abdomen Pelvis W Contrast  05/12/2015  CLINICAL DATA:  Left-sided chest pain beginning today. Nausea, vomiting, and diarrhea. History of kidney stones. Shortness of breath. Right knee replacement surgery 2 weeks ago. EXAM: CT ANGIOGRAPHY CHEST CT ABDOMEN AND PELVIS WITH CONTRAST TECHNIQUE: Multidetector CT imaging of the chest was performed using the standard protocol during bolus administration of intravenous contrast. Multiplanar CT image reconstructions and MIPs were obtained to evaluate the vascular anatomy. Multidetector CT imaging of the abdomen and pelvis was performed using the standard protocol during bolus administration of intravenous contrast. CONTRAST:  175 mL Isovue 370. Patient was initially injected with 100 mL, resulting in suboptimal opacification of the pulmonary arteries. Patient  was reinjected 75 mL and rescanned. COMPARISON:  02/01/2007 FINDINGS: CTA CHEST FINDINGS Technically adequate study with moderately good opacification of the central and segmental pulmonary arteries. No focal filling defects are demonstrated. No evidence of significant pulmonary embolus. Normal heart size. Moderate size pericardial effusion. Coronary artery calcifications. Normal caliber thoracic aorta. Great vessel origins are patent. No evidence of aortic dissection. Scattered lymph nodes in the mediastinum and hilar regions are not pathologically enlarged. The esophagus is decompressed. The mild emphysematous changes in the upper lungs. No focal airspace disease or consolidation. No pleural effusions. No pneumothorax. Airways appear patent. CT ABDOMEN and PELVIS FINDINGS The liver, spleen, gallbladder, pancreas, adrenal glands, abdominal aorta, inferior vena cava, and retroperitoneal lymph nodes are unremarkable. Nonobstructing stone in the midpole of the right kidney. No hydronephrosis or hydroureter. Renal nephrograms are symmetrical. Stomach, small bowel, and colon are not abnormally distended. No free air or free fluid in the abdomen. Small umbilical hernia containing fat. Pelvis: The appendix is normal. Bladder wall is not thickened. Prostate gland is mildly enlarged at 3.7 cm diameter. No free or loculated pelvic fluid collections. No pelvic mass or lymphadenopathy. No evidence of diverticulitis. No destructive bone lesions. Review of the MIP images confirms the above findings. IMPRESSION: No evidence of significant pulmonary embolus. Moderate-sized pericardial effusion. Nonobstructing stone in the right kidney. No evidence of bowel obstruction or inflammation. Electronically Signed   By: Burman Nieves M.D.   On: 05/12/2015 22:42    EKG:   Orders placed or performed during the hospital encounter of 05/12/15  . EKG 12-Lead  . EKG 12-Lead      Management plans discussed with the patient, family  and they are in agreement.  CODE STATUS:     Code Status Orders        Start     Ordered   05/13/15 0112  Full code   Continuous     05/13/15 0111    Code Status History    Date Active Date Inactive Code Status Order ID Comments User Context   This patient has a current code status but no historical code status.      TOTAL TIME TAKING CARE OF THIS PATIENT: 37 minutes.    Enid Baas M.D on  05/14/2015 at 8:28 AM  Between 7am to 6pm - Pager - [phone]  After 6pm go to [email or web address] - password [email or web address] [facility]  [facility]  Office  [phone]  CC: Primary care physician; [facility]

## 2015-05-14 NOTE — Progress Notes (Signed)
Patient is being discharge in a stable condition, summary and f/u care given to pt's , verbalized understanding ,

## 2015-06-01 ENCOUNTER — Observation Stay: Payer: Medicaid Other

## 2015-06-01 ENCOUNTER — Encounter: Payer: Self-pay | Admitting: Emergency Medicine

## 2015-06-01 ENCOUNTER — Ambulatory Visit: Payer: Medicaid Other | Admitting: Physician Assistant

## 2015-06-01 ENCOUNTER — Emergency Department (HOSPITAL_BASED_OUTPATIENT_CLINIC_OR_DEPARTMENT_OTHER)
Admit: 2015-06-01 | Discharge: 2015-06-01 | Disposition: A | Discharge status: Home / Self Care | Payer: Medicaid Other | Attending: Emergency Medicine | Admitting: Emergency Medicine

## 2015-06-01 ENCOUNTER — Emergency Department: Payer: Medicaid Other

## 2015-06-01 ENCOUNTER — Observation Stay
Admission: EM | Admit: 2015-06-01 | Discharge: 2015-06-02 | Disposition: A | Discharge status: Home / Self Care | Payer: Medicaid Other | Attending: Internal Medicine | Admitting: Internal Medicine

## 2015-06-01 DIAGNOSIS — Z79899 Other long term (current) drug therapy: Secondary | ICD-10-CM | POA: Insufficient documentation

## 2015-06-01 DIAGNOSIS — K429 Umbilical hernia without obstruction or gangrene: Secondary | ICD-10-CM | POA: Insufficient documentation

## 2015-06-01 DIAGNOSIS — E785 Hyperlipidemia, unspecified: Secondary | ICD-10-CM | POA: Diagnosis not present

## 2015-06-01 DIAGNOSIS — R590 Localized enlarged lymph nodes: Secondary | ICD-10-CM | POA: Diagnosis not present

## 2015-06-01 DIAGNOSIS — I319 Disease of pericardium, unspecified: Principal | ICD-10-CM | POA: Diagnosis present

## 2015-06-01 DIAGNOSIS — K219 Gastro-esophageal reflux disease without esophagitis: Secondary | ICD-10-CM | POA: Diagnosis not present

## 2015-06-01 DIAGNOSIS — Z96651 Presence of right artificial knee joint: Secondary | ICD-10-CM | POA: Diagnosis not present

## 2015-06-01 DIAGNOSIS — I081 Rheumatic disorders of both mitral and tricuspid valves: Secondary | ICD-10-CM | POA: Diagnosis not present

## 2015-06-01 DIAGNOSIS — R079 Chest pain, unspecified: Secondary | ICD-10-CM | POA: Insufficient documentation

## 2015-06-01 DIAGNOSIS — M1990 Unspecified osteoarthritis, unspecified site: Secondary | ICD-10-CM | POA: Diagnosis not present

## 2015-06-01 DIAGNOSIS — R0781 Pleurodynia: Secondary | ICD-10-CM | POA: Diagnosis present

## 2015-06-01 DIAGNOSIS — K921 Melena: Secondary | ICD-10-CM | POA: Diagnosis not present

## 2015-06-01 DIAGNOSIS — G2581 Restless legs syndrome: Secondary | ICD-10-CM | POA: Insufficient documentation

## 2015-06-01 DIAGNOSIS — J90 Pleural effusion, not elsewhere classified: Secondary | ICD-10-CM | POA: Insufficient documentation

## 2015-06-01 DIAGNOSIS — N400 Enlarged prostate without lower urinary tract symptoms: Secondary | ICD-10-CM | POA: Insufficient documentation

## 2015-06-01 DIAGNOSIS — F319 Bipolar disorder, unspecified: Secondary | ICD-10-CM | POA: Diagnosis not present

## 2015-06-01 DIAGNOSIS — R000 Tachycardia, unspecified: Secondary | ICD-10-CM | POA: Insufficient documentation

## 2015-06-01 DIAGNOSIS — I25119 Atherosclerotic heart disease of native coronary artery with unspecified angina pectoris: Secondary | ICD-10-CM | POA: Diagnosis not present

## 2015-06-01 DIAGNOSIS — Z823 Family history of stroke: Secondary | ICD-10-CM | POA: Diagnosis not present

## 2015-06-01 DIAGNOSIS — F4310 Post-traumatic stress disorder, unspecified: Secondary | ICD-10-CM | POA: Insufficient documentation

## 2015-06-01 DIAGNOSIS — I110 Hypertensive heart disease with heart failure: Secondary | ICD-10-CM | POA: Insufficient documentation

## 2015-06-01 DIAGNOSIS — R0602 Shortness of breath: Secondary | ICD-10-CM

## 2015-06-01 DIAGNOSIS — I509 Heart failure, unspecified: Secondary | ICD-10-CM | POA: Diagnosis not present

## 2015-06-01 DIAGNOSIS — F419 Anxiety disorder, unspecified: Secondary | ICD-10-CM | POA: Insufficient documentation

## 2015-06-01 DIAGNOSIS — R51 Headache: Secondary | ICD-10-CM | POA: Diagnosis not present

## 2015-06-01 DIAGNOSIS — Z7982 Long term (current) use of aspirin: Secondary | ICD-10-CM | POA: Insufficient documentation

## 2015-06-01 DIAGNOSIS — R55 Syncope and collapse: Secondary | ICD-10-CM | POA: Insufficient documentation

## 2015-06-01 DIAGNOSIS — D649 Anemia, unspecified: Secondary | ICD-10-CM

## 2015-06-01 DIAGNOSIS — Z87442 Personal history of urinary calculi: Secondary | ICD-10-CM | POA: Insufficient documentation

## 2015-06-01 DIAGNOSIS — Z888 Allergy status to other drugs, medicaments and biological substances status: Secondary | ICD-10-CM | POA: Diagnosis not present

## 2015-06-01 DIAGNOSIS — Z87891 Personal history of nicotine dependence: Secondary | ICD-10-CM | POA: Insufficient documentation

## 2015-06-01 DIAGNOSIS — I25118 Atherosclerotic heart disease of native coronary artery with other forms of angina pectoris: Secondary | ICD-10-CM | POA: Insufficient documentation

## 2015-06-01 HISTORY — DX: Disease of pericardium, unspecified: I31.9

## 2015-06-01 HISTORY — DX: Essential (primary) hypertension: I10

## 2015-06-01 HISTORY — DX: Calculus of kidney: N20.0

## 2015-06-01 LAB — BASIC METABOLIC PANEL
Anion gap: 6 (ref 5–15)
BUN: 13 mg/dL (ref 6–20)
CALCIUM: 9 mg/dL (ref 8.9–10.3)
CO2: 22 mmol/L (ref 22–32)
Chloride: 108 mmol/L (ref 101–111)
Creatinine, Ser: 0.89 mg/dL (ref 0.61–1.24)
GFR calc Af Amer: >60 mL/min (ref >60)
GLUCOSE: 116 mg/dL — AB (ref 65–99)
Potassium: 3.9 mmol/L (ref 3.5–5.1)
Sodium: 136 mmol/L (ref 135–145)

## 2015-06-01 LAB — TROPONIN I: Troponin I: <0.03 ng/mL (ref <0.031)

## 2015-06-01 LAB — CBC
HEMATOCRIT: 26.4 % — AB (ref 40.0–52.0)
HEMOGLOBIN: 8.8 g/dL — AB (ref 13.0–18.0)
MCH: 27.8 pg (ref 26.0–34.0)
MCHC: 33.1 g/dL (ref 32.0–36.0)
MCV: 84 fL (ref 80.0–100.0)
Platelets: 276 10*3/uL (ref 150–440)
RBC: 3.15 MIL/uL — ABNORMAL LOW (ref 4.40–5.90)
RDW: 16.8 % — AB (ref 11.5–14.5)
WBC: 6.7 10*3/uL (ref 3.8–10.6)

## 2015-06-01 LAB — ECHOCARDIOGRAM COMPLETE
Height: 72 in
Weight: 3008 oz

## 2015-06-01 LAB — MAGNESIUM: MAGNESIUM: 2.4 mg/dL (ref 1.7–2.4)

## 2015-06-01 LAB — BRAIN NATRIURETIC PEPTIDE: B NATRIURETIC PEPTIDE 5: 313 pg/mL — AB (ref 0.0–100.0)

## 2015-06-01 MED ORDER — DEXTROSE 5 % IV SOLN
1.0000 g | INTRAVENOUS | Status: DC
  Administered 2015-06-01: 1 g via INTRAVENOUS
  Filled 2015-06-01: qty 10

## 2015-06-01 MED ORDER — TAMSULOSIN HCL 0.4 MG PO CAPS: 0.4000 mg | ORAL_CAPSULE | Freq: Every day | ORAL | Status: DC

## 2015-06-01 MED ORDER — PANTOPRAZOLE SODIUM 40 MG PO TBEC
40.0000 mg | DELAYED_RELEASE_TABLET | Freq: Every day | ORAL | Status: DC
  Administered 2015-06-01 – 2015-06-02 (×2): 40 mg via ORAL
  Filled 2015-06-01 (×2): qty 1

## 2015-06-01 MED ORDER — METHOCARBAMOL 500 MG PO TABS
500.0000 mg | ORAL_TABLET | Freq: Four times a day (QID) | ORAL | Status: DC | PRN
  Filled 2015-06-01: qty 1

## 2015-06-01 MED ORDER — ACETAMINOPHEN 325 MG PO TABS
650.0000 mg | ORAL_TABLET | Freq: Four times a day (QID) | ORAL | Status: DC | PRN
  Administered 2015-06-01: 650 mg via ORAL
  Filled 2015-06-01: qty 2

## 2015-06-01 MED ORDER — TRAMADOL HCL 50 MG PO TABS
50.0000 mg | ORAL_TABLET | Freq: Four times a day (QID) | ORAL | Status: DC | PRN
  Administered 2015-06-01 – 2015-06-02 (×2): 50 mg via ORAL
  Filled 2015-06-01: qty 1

## 2015-06-01 MED ORDER — DIVALPROEX SODIUM 500 MG PO DR TAB
500.0000 mg | DELAYED_RELEASE_TABLET | Freq: Two times a day (BID) | ORAL | Status: DC
  Administered 2015-06-01 – 2015-06-02 (×2): 500 mg via ORAL
  Filled 2015-06-01 (×4): qty 1

## 2015-06-01 MED ORDER — QUETIAPINE FUMARATE ER 200 MG PO TB24
400.0000 mg | ORAL_TABLET | Freq: Every day | ORAL | Status: DC
  Administered 2015-06-01: 400 mg via ORAL
  Filled 2015-06-01 (×2): qty 2

## 2015-06-01 MED ORDER — BENZTROPINE MESYLATE 0.5 MG PO TABS: 0.5000 mg | ORAL_TABLET | Freq: Two times a day (BID) | ORAL | Status: DC | PRN

## 2015-06-01 MED ORDER — MORPHINE SULFATE (PF) 4 MG/ML IV SOLN
4.0000 mg | Freq: Once | INTRAVENOUS | Status: AC
Start: 2015-06-01 — End: 2015-06-01
  Administered 2015-06-01: 4 mg via INTRAVENOUS
  Filled 2015-06-01: qty 1

## 2015-06-01 MED ORDER — ONDANSETRON HCL 4 MG PO TABS
4.0000 mg | ORAL_TABLET | Freq: Four times a day (QID) | ORAL | Status: DC | PRN
  Administered 2015-06-02: 4 mg via ORAL
  Filled 2015-06-01: qty 1

## 2015-06-01 MED ORDER — ALBUTEROL SULFATE (2.5 MG/3ML) 0.083% IN NEBU: 2.5000 mg | INHALATION_SOLUTION | RESPIRATORY_TRACT | Status: DC | PRN

## 2015-06-01 MED ORDER — AZITHROMYCIN 250 MG PO TABS
ORAL_TABLET | ORAL | Status: AC
  Filled 2015-06-01: qty 1

## 2015-06-01 MED ORDER — QUETIAPINE FUMARATE 25 MG PO TABS: 50.0000 mg | ORAL_TABLET | Freq: Four times a day (QID) | ORAL | Status: DC | PRN

## 2015-06-01 MED ORDER — TRAMADOL HCL 50 MG PO TABS
ORAL_TABLET | ORAL | Status: AC
  Filled 2015-06-01: qty 1

## 2015-06-01 MED ORDER — FUROSEMIDE 10 MG/ML IJ SOLN
40.0000 mg | Freq: Once | INTRAMUSCULAR | Status: AC
  Administered 2015-06-01: 40 mg via INTRAVENOUS
  Filled 2015-06-01: qty 4

## 2015-06-01 MED ORDER — ACETAMINOPHEN 650 MG RE SUPP: 650.0000 mg | Freq: Four times a day (QID) | RECTAL | Status: DC | PRN

## 2015-06-01 MED ORDER — DEXTROSE 5 % IV SOLN
INTRAVENOUS | Status: AC
  Administered 2015-06-01: 1 g via INTRAVENOUS
  Filled 2015-06-01: qty 10

## 2015-06-01 MED ORDER — SODIUM CHLORIDE 0.9 % IV SOLN
INTRAVENOUS | Status: AC
  Administered 2015-06-01 – 2015-06-02 (×2): via INTRAVENOUS

## 2015-06-01 MED ORDER — GABAPENTIN 400 MG PO CAPS
400.0000 mg | ORAL_CAPSULE | Freq: Three times a day (TID) | ORAL | Status: DC
  Administered 2015-06-01 – 2015-06-02 (×2): 400 mg via ORAL
  Filled 2015-06-01 (×2): qty 1

## 2015-06-01 MED ORDER — FLUOXETINE HCL 20 MG PO CAPS
20.0000 mg | ORAL_CAPSULE | Freq: Every day | ORAL | Status: DC
  Administered 2015-06-02: 20 mg via ORAL
  Filled 2015-06-01: qty 1

## 2015-06-01 MED ORDER — IOPAMIDOL (ISOVUE-370) INJECTION 76%
75.0000 mL | Freq: Once | INTRAVENOUS | Status: AC | PRN
  Administered 2015-06-01: 75 mL via INTRAVENOUS

## 2015-06-01 MED ORDER — ENOXAPARIN SODIUM 40 MG/0.4ML ~~LOC~~ SOLN
40.0000 mg | SUBCUTANEOUS | Status: DC
  Administered 2015-06-01: 40 mg via SUBCUTANEOUS
  Filled 2015-06-01: qty 0.4

## 2015-06-01 MED ORDER — DOCUSATE SODIUM 100 MG PO CAPS
100.0000 mg | ORAL_CAPSULE | Freq: Two times a day (BID) | ORAL | Status: DC
  Administered 2015-06-01 – 2015-06-02 (×2): 100 mg via ORAL
  Filled 2015-06-01 (×2): qty 1

## 2015-06-01 MED ORDER — AZITHROMYCIN 500 MG PO TABS
500.0000 mg | ORAL_TABLET | Freq: Every day | ORAL | Status: AC
  Administered 2015-06-01: 500 mg via ORAL

## 2015-06-01 MED ORDER — ONDANSETRON HCL 4 MG/2ML IJ SOLN
4.0000 mg | Freq: Four times a day (QID) | INTRAMUSCULAR | Status: DC | PRN
  Administered 2015-06-01: 4 mg via INTRAVENOUS
  Filled 2015-06-01: qty 2

## 2015-06-01 MED ORDER — COLCHICINE 0.6 MG PO TABS
0.6000 mg | ORAL_TABLET | Freq: Two times a day (BID) | ORAL | Status: DC
  Administered 2015-06-01 – 2015-06-02 (×3): 0.6 mg via ORAL
  Filled 2015-06-01 (×3): qty 1

## 2015-06-01 MED ORDER — ENALAPRIL MALEATE 10 MG PO TABS
10.0000 mg | ORAL_TABLET | Freq: Every day | ORAL | Status: DC
  Administered 2015-06-02: 10 mg via ORAL
  Filled 2015-06-01: qty 1

## 2015-06-01 MED ORDER — ONDANSETRON HCL 4 MG/2ML IJ SOLN
4.0000 mg | Freq: Once | INTRAMUSCULAR | Status: AC
  Administered 2015-06-01: 4 mg via INTRAVENOUS
  Filled 2015-06-01: qty 2

## 2015-06-01 MED ORDER — AZITHROMYCIN 500 MG PO TABS
ORAL_TABLET | ORAL | Status: AC
  Administered 2015-06-01: 500 mg via ORAL
  Filled 2015-06-01: qty 1

## 2015-06-01 MED ORDER — MORPHINE SULFATE (PF) 2 MG/ML IV SOLN
2.0000 mg | Freq: Four times a day (QID) | INTRAVENOUS | Status: DC | PRN
  Administered 2015-06-01: 2 mg via INTRAVENOUS
  Filled 2015-06-01 (×2): qty 1

## 2015-06-01 MED ORDER — AZITHROMYCIN 250 MG PO TABS
250.0000 mg | ORAL_TABLET | Freq: Every day | ORAL | Status: DC
  Administered 2015-06-02: 250 mg via ORAL
  Filled 2015-06-01: qty 1

## 2015-06-01 NOTE — Progress Notes (Signed)
Patient arrived to 2A Room 245. Patient oriented to unit, all questions answered, and Fall Safety Plan signed. Skin assessment completed with Shanda BumpsJessica RN and skin intact with healed scar from recent surgery noted on right knee. A&Ox4, VSS, and NSR on verified tele box #[id number]. Nursing staff will continue to monitor. Lamonte RicherKara A Vanden Fawaz, RN

## 2015-06-01 NOTE — ED Notes (Signed)
Pt here with CP and shortness of breath since 0600 today. Pt reports d/c from hospital on 05/15/15 and had f/u apt with cardiologist today. Pt reports left sided CP under left breast, reports hard to take a deep breath.

## 2015-06-01 NOTE — Progress Notes (Signed)
Patient continues to complain of pain during breathing. Paged MD and new order for 2mg  IV morphine q6h PRN placed per Dr. Amado CoeGouru. Nursing staff will continue to monitor. Lamonte RicherKara A Juan Kissoon, RN

## 2015-06-01 NOTE — ED Notes (Signed)
Pt reports chest pain is back, also reports son SOB, EDP made aware

## 2015-06-01 NOTE — ED Notes (Signed)
Pt resting in bed, family at bedside, awake and alert in no acute distress 

## 2015-06-01 NOTE — ED Notes (Signed)
Pt resting in bed in no acute distress 

## 2015-06-01 NOTE — Consult Note (Signed)
Cardiology Consultation Note  Patient ID: [name], MRN: [id number], DOB/AGE: [birth year] 55 y.o. Admit date: 06/01/2015   Date of Consult: 06/01/2015 Primary Physician: [facility] Primary Cardiologist: Dr. Herbie Baltimore, MD (has only seen in consult x 12, never as outpatient) Requesting Physician: Mel Almond, MD  Chief Complaint: Chest pain and SOB Reason for Consult: Chest pain and SOB, rule out worsening pericardial effusion  HPI: 55 y.o. male with h/o recently diagnosed pericarditis in early May in the setting of viral infection leading to N/V/D, reported CKD with normal renal function by all documented labs, melena with pending colonoscopy, hepatitis C, bipolar disorder, renal stones, GERD, and osteoarthritis who was recently admitted to [facility] from 5/2-5/4 for acute pericarditis as above presented to [facility] ED on 5/22 acute onset of sharp chest pain and SOB this morning around 5:30 AM.  Patient was recently admitted to [facility] in early May as above with acute chest pain at rest in the setting of fevers, chills, N/V/D. His pain was worse with deep inspiration, though there is no change when supine vs sitting. Upon the patient's arrival to [facility] they were found to have a troponin of 0.04-->negative x 2, WBC 15.0-->.10.9-->8.7, K+ 4.1. ECG with possible pulsus paradoxus, CTA chest/abdomen/pelvis showed no evidence of PE, moderate-sized pericardial effusion, nonobstructing right renal stone, no evidence of bowel obstruction or inflammation. Echo showed EF of 60-65%, no RWMA, LV diastolic function was normal, LA was mildly dilated, RV systolic function normal, PASP normal, a moderate sized circumferential pericardial effusion was seen, 2.12 cm around the LV free wall, <1 cm around the RV free wall. Features were not c/w tamponade physiology. He was treated with colchicine and high-dose aspirin initially given his reported allergy to ibuprofen. He reported later he could take ibuprofen and was  discharged on high-dose ibuprofen and colchicine bid.   He reports since since his discharge he has continued to feel very fatigued, havingto have friends and family members do work for him. He did try to mow the lawn once and reports this being a mistake as it made him extremely tired. He did not continue to have chest pain over the past couple of weeks, just extreme fatigue. He sleep on 2 pillows at baseline and this has been stable. He does report early satiety since his discharge. Mild cough that has been nonproductive. No LE edema, He has stopped taking colchicine approximately 1 week ago. This was the duration of his hospital discharge Rx. He has since been on full-dose aspirin only.   He woke up this morning around 5:30 AM with sudden onset of sharp chest pain that radiated from the right-to-left sides. Pain is worse with deep inspiration. He does note the evening prior his breathing sounded like "popcorn." No associated nausea, vomiting, diaphoresis, presyncope, syncope, or dizziness. Pain is not positional and not reproducible to touch.   Upon the patient's arrival to [facility] they were found to have BNP of 313, negative troponin x 1, hgb 8.8 with prior admission hgb of 11.1 to 10.2, Trion 0.89, K+ 3.9. ECG as below, CXR showed mild bilateral pulmonary interstitial prominence with the possibility of pneumonitis. He is currently chest pain free in the [facility] ED.   Of note the patient has been dealing with melena for at least the past 1 year and has a colonoscopy pending. He also notes intermittent hematuria associated with his kidney stones. He denies any kidney stones recently.    Past Medical History  Diagnosis Date  .  Arthritis     knees and hands  . Kidney stones   . Depression   . Restless leg syndrome   . Bipolar disorder (HCC)   . Anxiety   . PTSD (post-traumatic stress disorder)   . GERD (gastroesophageal reflux disease)   . Syncope   . Hepatitis     HEP "C"  . Pericarditis 05/2015     a. echo 5/17: EF 60-65%, no RWMA, LV dias fxn nl, LA mildly dilated, RV sys fxn nl, PASP nl, moderate sized circumferential pericardial effusion was identified, 2.12 cm around the LV free wall, <1 cm around the RV free wall. Features were not c/w tamponade physiology      Most Recent Cardiac Studies: Echo 05/13/2015: Study Conclusions - Left ventricle: The cavity size was normal. Systolic function was  normal. The estimated ejection fraction was in the range of 60%  to 65%. Wall motion was normal; there were no regional wall  motion abnormalities. Left ventricular diastolic function  parameters were normal. - Left atrium: The atrium was mildly dilated. - Right ventricle: Systolic function was normal. - Pulmonary arteries: Systolic pressure was within the normal  range. - Pericardium, extracardiac: A moderate sized circumferential  pericardial effusion was identified, 2.12 cm around the LV free  wall, <1 cm around the RV free wall. Features were not consistent  with tamponade physiology.  Echo 06/01/2015: Study Conclusions  - Left ventricle: The cavity size was normal. There was mild  concentric hypertrophy. Systolic function was normal. The  estimated ejection fraction was in the range of 60% to 65%. Wall  motion was normal; there were no regional wall motion  abnormalities. Left ventricular diastolic function parameters  were normal. - Left atrium: The atrium was moderately dilated. - Pericardium, extracardiac: A small pericardial effusion was  identified posterior to the heart. There was no evidence of  hemodynamic compromise.  Impressions:  - Compared to the echocardiogram done on May 3, the pericardial  effusion is much smaller and mainly located in the posterior  area.   Surgical History:  Past Surgical History  Procedure Laterality Date  . Cystoscopy with ureteroscopy and stent placement    . Knee arthroscopy Right 06/25/2014    Procedure:  ARTHROSCOPY KNEE;  Surgeon: Deeann Saint, MD;  Location: [facility] ORS;  Service: Orthopedics;  Laterality: Right;  partial arthroscopic medial menisectomy  . Joint replacement    . Total knee arthroplasty Right 04/22/2015    Procedure: TOTAL KNEE ARTHROPLASTY;  Surgeon: Deeann Saint, MD;  Location: [facility] ORS;  Service: Orthopedics;  Laterality: Right;     Home Meds: Prior to Admission medications   Medication Sig Start Date End Date Taking? Authorizing Provider  aspirin 325 MG tablet Take 2 tablets (650 mg total) by mouth daily. X 2 weeks 05/14/15  Yes Enid Baas, MD  benztropine (COGENTIN) 0.5 MG tablet Take 1 tablet (0.5 mg total) by mouth 2 (two) times daily as needed for tremors. 04/25/15  Yes Deeann Saint, MD  colchicine 0.6 MG tablet Take 1 tablet (0.6 mg total) by mouth 2 (two) times daily. X 2 weeks 05/14/15  Yes Enid Baas, MD  divalproex (DEPAKOTE) 500 MG DR tablet Take 500 mg by mouth 2 (two) times daily.   Yes Historical Provider, MD  enalapril (VASOTEC) 10 MG tablet Take 10 mg by mouth daily.   Yes Historical Provider, MD  FLUoxetine (PROZAC) 20 MG capsule Take 20 mg by mouth daily.   Yes Historical Provider, MD  gabapentin (NEURONTIN) 400  MG capsule Take 1 capsule (400 mg total) by mouth 3 (three) times daily. 04/25/15  Yes Deeann SaintHoward Miller, MD  meloxicam (MOBIC) 15 MG tablet Take 1 tablet by mouth every evening. 05/25/15  Yes Historical Provider, MD  methocarbamol (ROBAXIN) 500 MG tablet Take 1 tablet (500 mg total) by mouth every 6 (six) hours as needed for muscle spasms. 04/25/15  Yes Deeann SaintHoward Miller, MD  pantoprazole (PROTONIX) 40 MG tablet Take 1 tablet (40 mg total) by mouth daily. 05/14/15  Yes Enid Baasadhika Kalisetti, MD  QUEtiapine (SEROQUEL) 50 MG tablet Take 50 mg by mouth 4 (four) times daily as needed (for agitation).    Yes Historical Provider, MD  SEROQUEL XR 400 MG 24 hr tablet Take 400 mg by mouth at bedtime.    Yes Historical Provider, MD  tamsulosin (FLOMAX) 0.4 MG CAPS  capsule Take 0.4 mg by mouth daily after breakfast.   Yes Historical Provider, MD  traMADol (ULTRAM) 50 MG tablet Take 50 mg by mouth every 6 (six) hours as needed for moderate pain.   Yes Historical Provider, MD    Inpatient Medications:     Allergies:  Allergies  Allergen Reactions  . Toradol [Ketorolac Tromethamine] Hives    Social History   Social History  . Marital Status: Single    Spouse Name: N/A  . Number of Children: N/A  . Years of Education: N/A   Occupational History  . Not on file.   Social History Main Topics  . Smoking status: Former Smoker -- 1.00 packs/day    Types: Cigarettes    Quit date: 05/16/1984  . Smokeless tobacco: Never Used  . Alcohol Use: No  . Drug Use: No  . Sexual Activity: Not on file   Other Topics Concern  . Not on file   Social History Narrative     Family History  Problem Relation Age of Onset  . CVA Mother     deceased     Review of Systems: Review of Systems  Constitutional: Positive for weight loss and malaise/fatigue. Negative for fever, chills and diaphoresis.  HENT: Negative for congestion and sore throat.   Eyes: Negative for discharge and redness.  Respiratory: Positive for cough and shortness of breath. Negative for hemoptysis, sputum production and wheezing.   Cardiovascular: Positive for chest pain and palpitations. Negative for orthopnea, claudication, leg swelling and PND.  Gastrointestinal: Positive for diarrhea and melena. Negative for heartburn, nausea, vomiting, abdominal pain, constipation and blood in stool.  Genitourinary: Negative for hematuria.  Musculoskeletal: Negative for myalgias and falls.  Skin: Negative for rash.  Neurological: Positive for weakness. Negative for dizziness, tingling, tremors, sensory change, speech change, focal weakness, loss of consciousness and headaches.  Endo/Heme/Allergies: Does not bruise/bleed easily.  Psychiatric/Behavioral: Negative for substance abuse. The patient  is not nervous/anxious.   All other systems reviewed and are negative.   Labs:  Recent Labs  06/01/15 0913  TROPONINI <0.03   Lab Results  Component Value Date   WBC 6.7 06/01/2015   HGB 8.8* 06/01/2015   HCT 26.4* 06/01/2015   MCV 84.0 06/01/2015   PLT 276 06/01/2015    Recent Labs Lab 06/01/15 0913  NA 136  K 3.9  CL 108  CO2 22  BUN 13  CREATININE 0.89  CALCIUM 9.0  GLUCOSE 116*   No results found for: CHOL, HDL, LDLCALC, TRIG No results found for: DDIMER  Radiology/Studies:  Dg Chest 2 View  06/01/2015  CLINICAL DATA:  Chest pain.  Shortness of breath  EXAM: CHEST  2 VIEW COMPARISON:  CT 05/12/2015. FINDINGS: Mediastinum hilar structures normal. Heart size normal. Mild bilateral from interstitial prominence noted. Mild pneumonitis cannot be excluded. No pleural effusion or pneumothorax . IMPRESSION: Mild bilateral pulmonary interstitial prominence, mild pneumonitis cannot be excluded. Electronically Signed   By: Maisie Fus  Register   On: 06/01/2015 09:03   Ct Angio Chest Pe W/cm &/or Wo Cm  05/12/2015  CLINICAL DATA:  Left-sided chest pain beginning today. Nausea, vomiting, and diarrhea. History of kidney stones. Shortness of breath. Right knee replacement surgery 2 weeks ago. EXAM: CT ANGIOGRAPHY CHEST CT ABDOMEN AND PELVIS WITH CONTRAST TECHNIQUE: Multidetector CT imaging of the chest was performed using the standard protocol during bolus administration of intravenous contrast. Multiplanar CT image reconstructions and MIPs were obtained to evaluate the vascular anatomy. Multidetector CT imaging of the abdomen and pelvis was performed using the standard protocol during bolus administration of intravenous contrast. CONTRAST:  175 mL Isovue 370. Patient was initially injected with 100 mL, resulting in suboptimal opacification of the pulmonary arteries. Patient was reinjected 75 mL and rescanned. COMPARISON:  02/01/2007 FINDINGS: CTA CHEST FINDINGS Technically adequate study with  moderately good opacification of the central and segmental pulmonary arteries. No focal filling defects are demonstrated. No evidence of significant pulmonary embolus. Normal heart size. Moderate size pericardial effusion. Coronary artery calcifications. Normal caliber thoracic aorta. Great vessel origins are patent. No evidence of aortic dissection. Scattered lymph nodes in the mediastinum and hilar regions are not pathologically enlarged. The esophagus is decompressed. The mild emphysematous changes in the upper lungs. No focal airspace disease or consolidation. No pleural effusions. No pneumothorax. Airways appear patent. CT ABDOMEN and PELVIS FINDINGS The liver, spleen, gallbladder, pancreas, adrenal glands, abdominal aorta, inferior vena cava, and retroperitoneal lymph nodes are unremarkable. Nonobstructing stone in the midpole of the right kidney. No hydronephrosis or hydroureter. Renal nephrograms are symmetrical. Stomach, small bowel, and colon are not abnormally distended. No free air or free fluid in the abdomen. Small umbilical hernia containing fat. Pelvis: The appendix is normal. Bladder wall is not thickened. Prostate gland is mildly enlarged at 3.7 cm diameter. No free or loculated pelvic fluid collections. No pelvic mass or lymphadenopathy. No evidence of diverticulitis. No destructive bone lesions. Review of the MIP images confirms the above findings. IMPRESSION: No evidence of significant pulmonary embolus. Moderate-sized pericardial effusion. Nonobstructing stone in the right kidney. No evidence of bowel obstruction or inflammation. Electronically Signed   By: Burman Nieves M.D.   On: 05/12/2015 22:42   Ct Abdomen Pelvis W Contrast  05/12/2015  CLINICAL DATA:  Left-sided chest pain beginning today. Nausea, vomiting, and diarrhea. History of kidney stones. Shortness of breath. Right knee replacement surgery 2 weeks ago. EXAM: CT ANGIOGRAPHY CHEST CT ABDOMEN AND PELVIS WITH CONTRAST TECHNIQUE:  Multidetector CT imaging of the chest was performed using the standard protocol during bolus administration of intravenous contrast. Multiplanar CT image reconstructions and MIPs were obtained to evaluate the vascular anatomy. Multidetector CT imaging of the abdomen and pelvis was performed using the standard protocol during bolus administration of intravenous contrast. CONTRAST:  175 mL Isovue 370. Patient was initially injected with 100 mL, resulting in suboptimal opacification of the pulmonary arteries. Patient was reinjected 75 mL and rescanned. COMPARISON:  02/01/2007 FINDINGS: CTA CHEST FINDINGS Technically adequate study with moderately good opacification of the central and segmental pulmonary arteries. No focal filling defects are demonstrated. No evidence of significant pulmonary embolus. Normal heart size. Moderate size pericardial effusion. Coronary  artery calcifications. Normal caliber thoracic aorta. Great vessel origins are patent. No evidence of aortic dissection. Scattered lymph nodes in the mediastinum and hilar regions are not pathologically enlarged. The esophagus is decompressed. The mild emphysematous changes in the upper lungs. No focal airspace disease or consolidation. No pleural effusions. No pneumothorax. Airways appear patent. CT ABDOMEN and PELVIS FINDINGS The liver, spleen, gallbladder, pancreas, adrenal glands, abdominal aorta, inferior vena cava, and retroperitoneal lymph nodes are unremarkable. Nonobstructing stone in the midpole of the right kidney. No hydronephrosis or hydroureter. Renal nephrograms are symmetrical. Stomach, small bowel, and colon are not abnormally distended. No free air or free fluid in the abdomen. Small umbilical hernia containing fat. Pelvis: The appendix is normal. Bladder wall is not thickened. Prostate gland is mildly enlarged at 3.7 cm diameter. No free or loculated pelvic fluid collections. No pelvic mass or lymphadenopathy. No evidence of diverticulitis.  No destructive bone lesions. Review of the MIP images confirms the above findings. IMPRESSION: No evidence of significant pulmonary embolus. Moderate-sized pericardial effusion. Nonobstructing stone in the right kidney. No evidence of bowel obstruction or inflammation. Electronically Signed   By: Burman Nieves M.D.   On: 05/12/2015 22:42    EKG: sinus tachycardia, 110 bpm, TWI III, nonspecific st/t changes aVF  Weights: Filed Weights   06/01/15 0842  Weight: 188 lb (85.276 kg)     Physical Exam: Blood pressure 116/83, pulse 90, temperature 97.7 F (36.5 C), temperature source Oral, resp. rate 13, height 6' (1.829 m), weight 188 lb (85.276 kg), SpO2 99 %. Body mass index is 25.49 kg/(m^2). General: Well developed, well nourished, in no acute distress. Head: Normocephalic, atraumatic, sclera non-icteric, no xanthomas, nares are without discharge.  Neck: Negative for carotid bruits. JVD not elevated. Lungs: Bilateral crackles at the bases. Breathing is unlabored. Heart: RRR with S1 S2. No murmurs, rubs, or gallops appreciated. Abdomen: Soft, non-tender, non-distended with normoactive bowel sounds. No hepatomegaly. No rebound/guarding. No obvious abdominal masses. Msk:  Strength and tone appear normal for age. Extremities: No clubbing or cyanosis. No edema.  Distal pedal pulses are 2+ and equal bilaterally. Neuro: Alert and oriented X 3. No facial asymmetry. No focal deficit. Moves all extremities spontaneously. Psych:  Responds to questions appropriately with a normal affect.    Assessment and Plan:   1. Chest pain with moderate risk of cardiac etiology: -Atypical features, though concerning for possible ischemia given the timeline and crackles on exam -Continue to cycle troponin. If troponin remains negative, could pursue nuclear stress testing on 5/23 to evaluate for high-risk ischemia. This would also be helpful for is pending GI evaluation. -If troponin trends upwards, he would  need cardiac catheterization, pending stable hgb -Currently chest pain free -Await formal echo report  2. History of pericardial effusion: -Improved -Given his anemia (prior hgb of 12 last month) with current hgb of 8.8 would avoid NSAIDs and treat with colchicine 0.6 mg bid only at this time -Repeat echo as above with normal LVSF and improved pericardial effusion with no evidence of cardiac tamponade  3. Possible pneumonitis: -May still be dealing with his vial infection from earlier in May -Supportive care  4. Acute anemia: -History of melena per patient report x 1 year -Has colonoscopy referral, just has not made appointment yet -Heme negative in the ED -Maintain hgb > 8.5 -Per IM/GI -Avoid NSAIDs as above -PPI  5. Kidney stones: -Stable    Elinor Dodge, PA-C Pager: [phone] 06/01/2015, 12:53 PM

## 2015-06-01 NOTE — ED Notes (Signed)
Attempted to call report X 2. 

## 2015-06-01 NOTE — ED Provider Notes (Signed)
[facility] Emergency Department Provider Note   ____________________________________________  Time seen: Upon ED arrival I have reviewed the triage vital signs and the triage nursing note.  HISTORY  Chief Complaint Chest Pain and Shortness of Breath   Historian Patient  HPI [name] is a 55 y.o. male with a history of recent bleed diagnosed pericarditis and moderate pericardial effusion, at the beginning of the month, is here for complaint of chest pain and trouble breathing. He states that he had similar symptoms when he was diagnosed with the pericardial effusion, but had improvement at home with 2 weeks on colchicine followed by currently taking 650 mg of aspirin daily. He states he woke up overnight feeling short of breath and he couldn't catch his breath for about half hour. He is denying GI symptoms of burping or belching although he does have a history of GERD and states this doesn't really sound seem like GERD to him.  No new cough or fevers.    Past Medical History  Diagnosis Date  . Arthritis     knees and hands  . Kidney stones   . Depression   . Restless leg syndrome   . Bipolar disorder (HCC)   . Anxiety   . PTSD (post-traumatic stress disorder)   . GERD (gastroesophageal reflux disease)   . Syncope   . Hepatitis     HEP "C"  . Pericarditis 05/2015    a. echo 5/17: EF 60-65%, no RWMA, LV dias fxn nl, LA mildly dilated, RV sys fxn nl, PASP nl, moderate sized circumferential pericardial effusion was identified, 2.12 cm around the LV free wall, <1 cm around the RV free wall. Features were not c/w tamponade physiology    Patient Active Problem List   Diagnosis Date Noted  . Pericarditis with effusion 05/13/2015  . Chest pain at rest 05/12/2015  . Dyspnea 05/12/2015  . SIRS (systemic inflammatory response syndrome) (HCC) 05/12/2015  . 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  Cystoscopy with ureteroscopy and stent placement    . Knee arthroscopy Right 06/25/2014    Procedure: ARTHROSCOPY KNEE;  Surgeon: Deeann Saint, MD;  Location: [facility] ORS;  Service: Orthopedics;  Laterality: Right;  partial arthroscopic medial menisectomy  . Joint replacement    . Total knee arthroplasty Right 04/22/2015    Procedure: TOTAL KNEE ARTHROPLASTY;  Surgeon: Deeann Saint, MD;  Location: [facility] ORS;  Service: Orthopedics;  Laterality: Right;    Current Outpatient Rx  Name  Route  Sig  Dispense  Refill  . aspirin 325 MG tablet   Oral   Take 2 tablets (650 mg total) by mouth daily. X 2 weeks   60 tablet   0   . benztropine (COGENTIN) 0.5 MG tablet   Oral   Take 1 tablet (0.5 mg total) by mouth 2 (two) times daily as needed for tremors.   30 tablet   2   . colchicine 0.6 MG tablet   Oral   Take 1 tablet (0.6 mg total) by mouth 2 (two) times daily. X 2 weeks   28 tablet   00   . divalproex (DEPAKOTE) 500 MG DR tablet   Oral   Take 500 mg by mouth 2 (two) times daily.         . enalapril (VASOTEC) 10 MG tablet   Oral   Take 10 mg by mouth daily.         Marland Kitchen  FLUoxetine (PROZAC) 20 MG capsule   Oral   Take 20 mg by mouth daily.         Marland Kitchen gabapentin (NEURONTIN) 400 MG capsule   Oral   Take 1 capsule (400 mg total) by mouth 3 (three) times daily.   60 capsule   3   . meloxicam (MOBIC) 15 MG tablet   Oral   Take 1 tablet by mouth every evening.      3   . methocarbamol (ROBAXIN) 500 MG tablet   Oral   Take 1 tablet (500 mg total) by mouth every 6 (six) hours as needed for muscle spasms.   60 tablet   2   . pantoprazole (PROTONIX) 40 MG tablet   Oral   Take 1 tablet (40 mg total) by mouth daily.   30 tablet   2   . QUEtiapine (SEROQUEL) 50 MG tablet   Oral   Take 50 mg by mouth 4 (four) times daily as needed (for agitation).          . SEROQUEL XR 400 MG 24 hr tablet   Oral   Take 400 mg by mouth at bedtime.       2     Dispense as written.    . tamsulosin (FLOMAX) 0.4 MG CAPS capsule   Oral   Take 0.4 mg by mouth daily after breakfast.         . traMADol (ULTRAM) 50 MG tablet   Oral   Take 50 mg by mouth every 6 (six) hours as needed for moderate pain.           Allergies Toradol  Family History  Problem Relation Age of Onset  . CVA Mother     deceased    Social History Social History  Substance Use Topics  . Smoking status: Former Smoker -- 1.00 packs/day    Types: Cigarettes    Quit date: 05/16/1984  . Smokeless tobacco: Never Used  . Alcohol Use: No    Review of Systems  Constitutional: Negative for fever. Eyes: Negative for visual changes. ENT: Negative for sore throat. Cardiovascular: Positive for pleuritic right-sided chest pain. Respiratory: Positive for shortness of breath somewhat worse with lying down. No coughing or sputum production. Gastrointestinal: Negative for abdominal pain, vomiting and diarrhea. Genitourinary: Negative for dysuria. Musculoskeletal: Negative for back pain. Skin: Negative for rash. Neurological: Negative for headache. 10 point Review of Systems otherwise negative ____________________________________________   PHYSICAL EXAM:  VITAL SIGNS: ED Triage Vitals  Enc Vitals Group     BP 06/01/15 0842 141/82 mmHg     Pulse Rate 06/01/15 0842 107     Resp 06/01/15 0842 24     Temp 06/01/15 0842 97.7 F (36.5 C)     Temp Source 06/01/15 0842 Oral     SpO2 06/01/15 0842 99 %     Weight 06/01/15 0842 188 lb (85.276 kg)     Height 06/01/15 0842 6' (1.829 m)     Head Cir --      Peak Flow --      Pain Score 06/01/15 0842 7     Pain Loc --      Pain Edu? --      Excl. in GC? --      Constitutional: Alert and oriented. Well appearing and in no distress. HEENT   Head: Normocephalic and atraumatic.      Eyes: Conjunctivae are normal. PERRL. Normal extraocular movements.      Ears:  Nose: No congestion/rhinnorhea.   Mouth/Throat: Mucous membranes  are moist.   Neck: No stridor. Cardiovascular/Chest: Normal rate, regular rhythm.  No murmurs, rubs, or gallops. Respiratory: Normal respiratory effort without tachypnea nor retractions. Breath sounds are clear and equal bilaterally. No wheezes/rales/rhonchi. Gastrointestinal: Soft. No distention, no guarding, no rebound. Nontender.    Genitourinary/rectal: Nontender ,  Musculoskeletal: Nontender with normal range of motion in all extremities. No joint effusions.  No lower extremity tenderness.  No edema. Neurologic:  Normal speech and language. No gross or focal neurologic deficits are appreciated. Skin:  Skin is warm, dry and intact. No rash noted. Psychiatric: Mood and affect are normal. Speech and behavior are normal. Patient exhibits appropriate insight and judgment.  ____________________________________________   EKG I, Governor Rooksebecca Kemisha Bonnette, MD, the attending physician have personally viewed and interpreted all ECGs.  110 beats per minute. Sinus tachycardia. Narrow QRS. Normal axis. Normal ST and T-wave ____________________________________________  LABS (pertinent positives/negatives)  Labs Reviewed  BASIC METABOLIC PANEL - Abnormal; Notable for the following:    Glucose, Bld 116 (*)    All other components within normal limits  CBC - Abnormal; Notable for the following:    RBC 3.15 (*)    Hemoglobin 8.8 (*)    HCT 26.4 (*)    RDW 16.8 (*)    All other components within normal limits  BRAIN NATRIURETIC PEPTIDE - Abnormal; Notable for the following:    B Natriuretic Peptide 313.0 (*)    All other components within normal limits  TROPONIN I  TROPONIN I  TROPONIN I  MAGNESIUM     ____________________________________________  RADIOLOGY All Xrays were viewed by me. Imaging interpreted by Radiologist.  Chest two-view:  IMPRESSION: Mild bilateral pulmonary interstitial prominence, mild pneumonitis cannot be  excluded. __________________________________________  PROCEDURES  Procedure(s) performed: None  Critical Care performed: None  ____________________________________________   ED COURSE / ASSESSMENT AND PLAN  Pertinent labs & imaging results that were available during my care of the patient were reviewed by me and considered in my medical decision making (see chart for details).   Patient here with worsening chest pain after a recent diagnosis of pericardial effusion, raising concern for possible new or worsening pericardial effusion. He is not hypoxic. He is initially tachycardic, but complaining of pain.  Patient does appear stable and not having any evidence of acute tampanade.  I discussed with on-call radiologist who recommended going ahead and obtain an echocardiogram.  History chest x-ray was unremarkable for infiltrate or pneumothorax.  Clinically, it doesn't sound like he has symptoms of pneumonia or pulmonary infection.  History per list negative. His EKG was reassuring.  His BNP is 313, mildly elevated.  His echocardiogram showed actually much improved, just a small residual posterior pericardial effusion.  Dr. Kirke CorinArida and his physician assistant did consult here in the ED and are recommending hospital admission for cardiac workup/rule out including stress test in the hospital.  He recommended holding off on additional NSAIDs because of possible GI workup. Patient reportedly has a history of melanoma that GI is considering doing colonoscopy.  Of note his hemoglobin is 8.8, down from prior almost 2 points. I did do a heme checked today and it was Hemoccult negative.   We discussed whether or not to obtain a chest CT, and I don't have a high suspicion for PE and patient did have a scan about 4 weeks ago for similar symptoms. We discussed risks versus benefit and patient chose to avoid this for now. I think  this is reasonable.    CONSULTATIONS:   Dr. Amado Coe, hospitalist  for admission.   Patient / Family / Caregiver informed of clinical course, medical decision-making process, and agree with plan.  Dr. Amado Coe did, after seeing the patient, decide to CT scan to rule out PE. ___________________________________________   FINAL CLINICAL IMPRESSION(S) / ED DIAGNOSES   Final diagnoses:  Acute on chronic congestive heart failure, unspecified congestive heart failure type (HCC)  Shortness of breath  Chest pain, unspecified chest pain type  Symptomatic anemia              Note: This dictation was prepared with Dragon dictation. Any transcriptional errors that result from this process are unintentional   Governor Rooks, MD 06/01/15 1409

## 2015-06-01 NOTE — Progress Notes (Signed)
*  PRELIMINARY RESULTS* Echocardiogram 2D Echocardiogram has been performed.  Georgann HousekeeperJerry R Hege 06/01/2015, 12:29 PM

## 2015-06-01 NOTE — H&P (Signed)
[facility] [place] [facility]   PATIENT NAME: [name]    MR#:  [id number]  DATE OF BIRTH:  January 18, 1960  DATE OF ADMISSION:  06/01/2015  [facility]   REQUESTING/REFERRING PHYSICIAN: Dr. Shaune PollackLord  CHIEF COMPLAINT:   Chest pain with respiration HISTORY OF PRESENT ILLNESS:  Victor RusselJames Maguire  is a 55 y.o. male with a known history of Hypertension, GERD, recent history of viral infection during early May which has caused pericarditis including nausea vomiting and diarrhea was admitted to the hospital on May 2 and was discharged on May 4 for acute pericarditis is presenting to the ED with a chief complaint of both right and left-sided lateral chest pain which started today morning. Patient is reporting that he woke up with chest pain and couldn't catch his breath. He is reporting that it hurts to breathe patient was seen by cardiology PA and had echocardiogram done which has revealed very minimal pleural effusion when compared to May 3 echocardiogram. Initial troponin is negative and hospitalist team is called to admit the patient. Patient is still complaining of pleuritic chest pain during my examination and uncomfortable. Denies any active bleeding or abdominal pain.  PAST MEDICAL HISTORY:   Past Medical History  Diagnosis Date  . Arthritis     knees and hands  . Kidney stones   . Depression   . Restless leg syndrome   . Bipolar disorder (HCC)   . Anxiety   . PTSD (post-traumatic stress disorder)   . GERD (gastroesophageal reflux disease)   . Syncope   . Hepatitis     HEP "C"  . Pericarditis 05/2015    a. echo 5/17: EF 60-65%, no RWMA, LV dias fxn nl, LA mildly dilated, RV sys fxn nl, PASP nl, moderate sized circumferential pericardial effusion was identified, 2.12 cm around the LV free wall, <1 cm around the RV free wall. Features were not c/w tamponade physiology    PAST SURGICAL HISTOIRY:   Past Surgical History   Procedure Laterality Date  . Cystoscopy with ureteroscopy and stent placement    . Knee arthroscopy Right 06/25/2014    Procedure: ARTHROSCOPY KNEE;  Surgeon: Deeann SaintHoward Miller, MD;  Location: [facility] ORS;  Service: Orthopedics;  Laterality: Right;  partial arthroscopic medial menisectomy  . Joint replacement    . Total knee arthroplasty Right 04/22/2015    Procedure: TOTAL KNEE ARTHROPLASTY;  Surgeon: Deeann SaintHoward Miller, MD;  Location: [facility] ORS;  Service: Orthopedics;  Laterality: Right;    SOCIAL HISTORY:   Social History  Substance Use Topics  . Smoking status: Former Smoker -- 1.00 packs/day    Types: Cigarettes    Quit date: 05/16/1984  . Smokeless tobacco: Never Used  . Alcohol Use: No    FAMILY HISTORY:   Family History  Problem Relation Age of Onset  . CVA Mother     deceased    DRUG ALLERGIES:   Allergies  Allergen Reactions  . Toradol [Ketorolac Tromethamine] Hives    REVIEW OF SYSTEMS:  CONSTITUTIONAL: No fever, fatigue or weakness.  EYES: No blurred or double vision.  EARS, NOSE, AND THROAT: No tinnitus or ear pain.  RESPIRATORY: No cough, shortness of breath, wheezing or hemoptysis.  CARDIOVASCULAR:  chest pain with inspiration, denies orthopnea, edema.  GASTROINTESTINAL: No nausea, vomiting, diarrhea or abdominal pain.  GENITOURINARY: No dysuria, hematuria.  ENDOCRINE: No polyuria, nocturia,  HEMATOLOGY: No anemia, easy bruising or bleeding SKIN: No rash or lesion. MUSCULOSKELETAL: No  joint pain or arthritis.   NEUROLOGIC: No tingling, numbness, weakness.  PSYCHIATRY: No anxiety or depression.   MEDICATIONS AT HOME:   Prior to Admission medications   Medication Sig Start Date End Date Taking? Authorizing Provider  aspirin 325 MG tablet Take 2 tablets (650 mg total) by mouth daily. X 2 weeks 05/14/15  Yes Enid Baas, MD  benztropine (COGENTIN) 0.5 MG tablet Take 1 tablet (0.5 mg total) by mouth 2 (two) times daily as needed for tremors. 04/25/15  Yes Deeann Saint, MD  colchicine 0.6 MG tablet Take 1 tablet (0.6 mg total) by mouth 2 (two) times daily. X 2 weeks 05/14/15  Yes Enid Baas, MD  divalproex (DEPAKOTE) 500 MG DR tablet Take 500 mg by mouth 2 (two) times daily.   Yes Historical Provider, MD  enalapril (VASOTEC) 10 MG tablet Take 10 mg by mouth daily.   Yes Historical Provider, MD  FLUoxetine (PROZAC) 20 MG capsule Take 20 mg by mouth daily.   Yes Historical Provider, MD  gabapentin (NEURONTIN) 400 MG capsule Take 1 capsule (400 mg total) by mouth 3 (three) times daily. 04/25/15  Yes Deeann Saint, MD  meloxicam (MOBIC) 15 MG tablet Take 1 tablet by mouth every evening. 05/25/15  Yes Historical Provider, MD  methocarbamol (ROBAXIN) 500 MG tablet Take 1 tablet (500 mg total) by mouth every 6 (six) hours as needed for muscle spasms. 04/25/15  Yes Deeann Saint, MD  pantoprazole (PROTONIX) 40 MG tablet Take 1 tablet (40 mg total) by mouth daily. 05/14/15  Yes Enid Baas, MD  QUEtiapine (SEROQUEL) 50 MG tablet Take 50 mg by mouth 4 (four) times daily as needed (for agitation).    Yes Historical Provider, MD  SEROQUEL XR 400 MG 24 hr tablet Take 400 mg by mouth at bedtime.    Yes Historical Provider, MD  tamsulosin (FLOMAX) 0.4 MG CAPS capsule Take 0.4 mg by mouth daily after breakfast.   Yes Historical Provider, MD  traMADol (ULTRAM) 50 MG tablet Take 50 mg by mouth every 6 (six) hours as needed for moderate pain.   Yes Historical Provider, MD      VITAL SIGNS:  Blood pressure 132/93, pulse 79, temperature 97.7 F (36.5 C), temperature source Oral, resp. rate 14, height 6' (1.829 m), weight 85.276 kg (188 lb), SpO2 98 %.  PHYSICAL EXAMINATION:  GENERAL:  55 y.o.-year-old patient lying in the bed with no acute distress.  EYES: Pupils equal, round, reactive to light and accommodation. No scleral icterus. Extraocular muscles intact.  HEENT: Head atraumatic, normocephalic. Oropharynx and nasopharynx clear.  NECK:  Supple, no jugular  venous distention. No thyroid enlargement, no tenderness.  LUNGS: Normal breath sounds bilaterally, no wheezing, rales,rhonchi or crepitation. No use of accessory muscles of respiration.  CARDIOVASCULAR: S1, S2 normal. No murmurs, rubs, or gallops. No reproducible chest pain ABDOMEN: Soft, nontender, nondistended. Bowel sounds present. No organomegaly or mass.  EXTREMITIES: No pedal edema, cyanosis, or clubbing.  NEUROLOGIC: Cranial nerves II through XII are intact. Muscle strength 5/5 in all extremities. Sensation intact. Gait not checked.  PSYCHIATRIC: The patient is alert and oriented x 3.  SKIN: No obvious rash, lesion, or ulcer.   LABORATORY PANEL:   CBC  Recent Labs Lab 06/01/15 0913  WBC 6.7  HGB 8.8*  HCT 26.4*  PLT 276   ------------------------------------------------------------------------------------------------------------------  Chemistries   Recent Labs Lab 06/01/15 0913  NA 136  K 3.9  CL 108  CO2 22  GLUCOSE 116*  BUN 13  CREATININE 0.89  CALCIUM 9.0   ------------------------------------------------------------------------------------------------------------------  Cardiac Enzymes  Recent Labs Lab 06/01/15 0913  TROPONINI <0.03   ------------------------------------------------------------------------------------------------------------------  RADIOLOGY:  Dg Chest 2 View  06/01/2015  CLINICAL DATA:  Chest pain.  Shortness of breath EXAM: CHEST  2 VIEW COMPARISON:  CT 05/12/2015. FINDINGS: Mediastinum hilar structures normal. Heart size normal. Mild bilateral from interstitial prominence noted. Mild pneumonitis cannot be excluded. No pleural effusion or pneumothorax . IMPRESSION: Mild bilateral pulmonary interstitial prominence, mild pneumonitis cannot be excluded. Electronically Signed   By: Maisie Fus  Register   On: 06/01/2015 09:03    EKG:   Orders placed or performed during the hospital encounter of 06/01/15  . EKG 12-Lead  . EKG 12-Lead  .  ED EKG within 10 minutes  . ED EKG within 10 minutes    IMPRESSION AND PLAN:   Brandy Kabat  is a 55 y.o. male with a known history of Hypertension, GERD, recent history of viral infection during early May which has caused pericarditis including nausea vomiting and diarrhea was admitted to the hospital on May 2 and was discharged on May 4 for acute pericarditis is presenting to the ED with a chief complaint of both right and left-sided lateral chest pain which started today morning. Patient is reporting that he woke up with chest pain and couldn't catch his breath. He is reporting that it hurts to breathe patient was seen by cardiology PA and had echocardiogram done which has revealed very minimal pleural effusion when compared to May 3 echocardiogram. Initial troponin is negative and hospitalist team is called to admit the patient.   #Pleuritic chest pain CT angiogram of the chest is ordered to rule out pulmonary embolism Will avoid NSAIDs as recommended by cardiology Cycle cardiac biomarkers. Initial troponin is negative. Patient will get stress test in a.m. if troponins are negative otherwise seen. Cardiac catheterization in a.m. Cardiac consult is placed and patient was evaluated by cardiology PA Ryan Echocardiogram with very minimal pericardial effusion when compared to May 3 echocardiogram and normal ejection fraction  #Pneumonitis this could be the main etiology of the pleuritic chest pain Will provide patient with IV Rocephin and azithromycin Breathing treatments as needed  #Anemia Stool for occult blood is negative. GI consult is placed for further evaluation after cardiac clearance  #Recent history of pericardial effusion probably viral etiology Clinically improving. Echocardiogram with improving pericardial effusion when compared to May 3 echocardiogram Continue colchicine as recommended by cardiology and avoid NSAIDs in view of anemia and possible GI bleed  #Essential  hypertension continue his home medication Toprol and titrate as needed  All the records are reviewed and case discussed with ED provider. Management plans discussed with the patient, he is in agreement.  CODE STATUS: [id number]- Patsy- friend  TOTAL TIME TAKING CARE OF THIS PATIENT: 45 minutes.    Ramonita Lab M.D on 06/01/2015 at 2:22 PM  Between 7am to 6pm - Pager - [phone]  After 6pm go to [email or web address] - password [email or web address] [facility]  [facility]  Office  [phone]  CC: Primary care physician; [facility]

## 2015-06-01 NOTE — ED Notes (Signed)
ECHO at bedside.

## 2015-06-02 ENCOUNTER — Telehealth: Payer: Self-pay | Admitting: Cardiology

## 2015-06-02 ENCOUNTER — Encounter: Payer: Self-pay | Admitting: Radiology

## 2015-06-02 ENCOUNTER — Encounter: Payer: Medicaid Other | Attending: Physician Assistant

## 2015-06-02 DIAGNOSIS — I25119 Atherosclerotic heart disease of native coronary artery with unspecified angina pectoris: Secondary | ICD-10-CM | POA: Diagnosis not present

## 2015-06-02 DIAGNOSIS — D500 Iron deficiency anemia secondary to blood loss (chronic): Secondary | ICD-10-CM | POA: Diagnosis not present

## 2015-06-02 DIAGNOSIS — E785 Hyperlipidemia, unspecified: Secondary | ICD-10-CM | POA: Insufficient documentation

## 2015-06-02 DIAGNOSIS — R079 Chest pain, unspecified: Secondary | ICD-10-CM | POA: Insufficient documentation

## 2015-06-02 DIAGNOSIS — R0781 Pleurodynia: Secondary | ICD-10-CM

## 2015-06-02 DIAGNOSIS — D649 Anemia, unspecified: Secondary | ICD-10-CM

## 2015-06-02 DIAGNOSIS — I25118 Atherosclerotic heart disease of native coronary artery with other forms of angina pectoris: Secondary | ICD-10-CM | POA: Insufficient documentation

## 2015-06-02 LAB — LIPID PANEL
CHOL/HDL RATIO: 6.7 ratio
CHOLESTEROL: 187 mg/dL (ref 0–200)
HDL: 28 mg/dL — ABNORMAL LOW (ref >40)
LDL Cholesterol: 126 mg/dL — ABNORMAL HIGH (ref 0–99)
TRIGLYCERIDES: 166 mg/dL — AB (ref <150)
VLDL: 33 mg/dL (ref 0–40)

## 2015-06-02 LAB — COMPREHENSIVE METABOLIC PANEL
ALBUMIN: 3.2 g/dL — AB (ref 3.5–5.0)
ALK PHOS: 62 U/L (ref 38–126)
ALT: 12 U/L — AB (ref 17–63)
AST: 11 U/L — AB (ref 15–41)
Anion gap: 8 (ref 5–15)
BILIRUBIN TOTAL: 0.3 mg/dL (ref 0.3–1.2)
BUN: 10 mg/dL (ref 6–20)
CO2: 26 mmol/L (ref 22–32)
CREATININE: 0.91 mg/dL (ref 0.61–1.24)
Calcium: 8.9 mg/dL (ref 8.9–10.3)
Chloride: 104 mmol/L (ref 101–111)
GFR calc Af Amer: >60 mL/min (ref >60)
GLUCOSE: 108 mg/dL — AB (ref 65–99)
POTASSIUM: 3.8 mmol/L (ref 3.5–5.1)
Sodium: 138 mmol/L (ref 135–145)
TOTAL PROTEIN: 6.9 g/dL (ref 6.5–8.1)

## 2015-06-02 LAB — NM MYOCAR MULTI W/SPECT W/WALL MOTION / EF
CHL CUP NUCLEAR SDS: 0
CHL CUP NUCLEAR SRS: 0
CHL CUP NUCLEAR SSS: 0
CHL CUP RESTING HR STRESS: 88 {beats}/min
LV dias vol: 60 mL (ref 62–150)
LV sys vol: 10 mL
Peak HR: 112 {beats}/min
Percent HR: 66 %
TID: 0.9

## 2015-06-02 LAB — CBC
HEMATOCRIT: 25.5 % — AB (ref 40.0–52.0)
HEMOGLOBIN: 8.6 g/dL — AB (ref 13.0–18.0)
MCH: 28 pg (ref 26.0–34.0)
MCHC: 33.8 g/dL (ref 32.0–36.0)
MCV: 82.7 fL (ref 80.0–100.0)
Platelets: 283 10*3/uL (ref 150–440)
RBC: 3.08 MIL/uL — AB (ref 4.40–5.90)
RDW: 16.8 % — AB (ref 11.5–14.5)
WBC: 5.8 10*3/uL (ref 3.8–10.6)

## 2015-06-02 LAB — RETICULOCYTES
RBC.: 3.07 MIL/uL — ABNORMAL LOW (ref 4.40–5.90)
Retic Count, Absolute: 73.7 10*3/uL (ref 19.0–183.0)
Retic Ct Pct: 2.4 % (ref 0.4–3.1)

## 2015-06-02 LAB — VITAMIN B12: Vitamin B-12: 383 pg/mL (ref 180–914)

## 2015-06-02 LAB — TROPONIN I

## 2015-06-02 MED ORDER — PANTOPRAZOLE SODIUM 40 MG PO TBEC: 40.0000 mg | DELAYED_RELEASE_TABLET | Freq: Two times a day (BID) | ORAL | Status: DC

## 2015-06-02 MED ORDER — ROSUVASTATIN CALCIUM 10 MG PO TABS: 10.0000 mg | ORAL_TABLET | Freq: Every day | ORAL | Status: DC

## 2015-06-02 MED ORDER — MORPHINE SULFATE (PF) 2 MG/ML IV SOLN
2.0000 mg | INTRAVENOUS | Status: DC | PRN
  Administered 2015-06-02 (×2): 2 mg via INTRAVENOUS
  Filled 2015-06-02: qty 1

## 2015-06-02 MED ORDER — COLCHICINE 0.6 MG PO TABS: 0.6000 mg | ORAL_TABLET | Freq: Every day | ORAL | Status: DC

## 2015-06-02 MED ORDER — TECHNETIUM TC 99M TETROFOSMIN IV KIT
13.0000 | PACK | Freq: Once | INTRAVENOUS | Status: AC | PRN
  Administered 2015-06-02: 13.89 via INTRAVENOUS

## 2015-06-02 MED ORDER — REGADENOSON 0.4 MG/5ML IV SOLN
0.4000 mg | Freq: Once | INTRAVENOUS | Status: DC
  Administered 2015-06-02: 0.4 mg via INTRAVENOUS
  Filled 2015-06-02: qty 5

## 2015-06-02 MED ORDER — LEVOFLOXACIN 500 MG PO TABS: 500.0000 mg | ORAL_TABLET | Freq: Every day | ORAL | Status: DC

## 2015-06-02 MED ORDER — TECHNETIUM TC 99M TETROFOSMIN IV KIT
30.0000 | PACK | Freq: Once | INTRAVENOUS | Status: AC | PRN
  Administered 2015-06-02: 31.22 via INTRAVENOUS

## 2015-06-02 NOTE — Discharge Summary (Signed)
[facility] [place] [facility]   PATIENT NAME: [name]    MR#:  [id number]  DATE OF BIRTH:  [birth year]  DATE OF ADMISSION:  06/01/2015 ADMITTING PHYSICIAN: Ramonita LabAruna Gouru, MD  DATE OF DISCHARGE: 06/02/15  PRIMARY CARE PHYSICIAN: [facility]    ADMISSION DIAGNOSIS:  Shortness of breath [R06.02] Pleuritic chest pain [R07.81] Chest pain with moderate risk for cardiac etiology [R07.9] Symptomatic anemia [D64.9] Chest pain, unspecified chest pain type [R07.9] Acute on chronic congestive heart failure, unspecified congestive heart failure type (HCC) [I50.9]  DISCHARGE DIAGNOSIS:  Active Problems:   Pericarditis with effusion   Pleuritic chest pain   Anemia   Chest pain with moderate risk for cardiac etiology   Coronary artery disease involving native coronary artery of native heart with angina pectoris (HCC)   Hyperlipidemia   SECONDARY DIAGNOSIS:   Past Medical History  Diagnosis Date  . Arthritis     knees and hands  . Kidney stones   . Depression   . Restless leg syndrome   . Bipolar disorder (HCC)   . Anxiety   . PTSD (post-traumatic stress disorder)   . GERD (gastroesophageal reflux disease)   . Syncope   . Hepatitis     HEP "C"  . Pericarditis 05/2015    a. echo 5/17: EF 60-65%, no RWMA, LV dias fxn nl, LA mildly dilated, RV sys fxn nl, PASP nl, moderate sized circumferential pericardial effusion was identified, 2.12 cm around the LV free wall, <1 cm around the RV free wall. Features were not c/w tamponade physiology  . Hypertension     HOSPITAL COURSE:   55 year old male with past medical history significant for hepatitis C, bipolar disorder, renal stones, GERD and osteoarthritis who had right knee replacement surgery Last month, admission 3 weeks ago for pericarditis and pericardial effusion comes back to the hospital secondary to shortness of breath and chest pain.  #1 chest pain-pleuritic chest pain,  atypical. -CT of the chest negative for any PE, pericardial effusion has actually improved based on echocardiogram and cardiologist opinion. -Appreciate cardiology consult. Due to significant atherosclerotic disease noted on the CT, patient underwent a Myoview today which was negative for any ischemia. -Check lipid panel. Will start on statin. Avoid aspirin due to anemia at this time. -Continue colchicine as outpatient and follow up with cardiology. -CT also revealed possible infiltrate at left base. He was started on Rocephin and azithromycin in the hospital. Discharge on Levaquin.  #2 Acute on chronic anemia- Baseline hemoglobin for the last couple of months seems to be around 10. Hemoglobin dropped to 8.8 this admission and has remained stable. No active bleeding. -Likely has GI source of chronic blood loss. Anemia panel with iron studies is pending. Patient has been on high-dose aspirin for his inflammatory pericarditis for 3 weeks now. Discontinue aspirin. Discontinue colchicine. - no GI on call, since hb is stable- outpatient follow up for EGD and colonoscopy will be arranged - will be discharged on protonix bid. Avoid NSAIDS -His fatigue and dyspnea could be from his anemia.  #3 bipolar disorder-stable at this time. Continue outpatient medications. Patient on Seroquel, Prozac, Cogentin and Depakote  #4 hypertension-continue enalapril  #5 BPH- continue Flomax  Patient will be discharged today and outpatient follow-ups will be arranged.  DISCHARGE CONDITIONS:   Stable  CONSULTS OBTAINED:  Treatment Team:  Iran OuchMuhammad A Arida, MD  DRUG ALLERGIES:   Allergies  Allergen Reactions  . Toradol [Ketorolac Tromethamine] Hives  DISCHARGE MEDICATIONS:   Current Discharge Medication List    START taking these medications   Details  levofloxacin (LEVAQUIN) 500 MG tablet Take 1 tablet (500 mg total) by mouth daily. X 6 more days Qty: 6 tablet, Refills: 0      CONTINUE these  medications which have CHANGED   Details  colchicine 0.6 MG tablet Take 1 tablet (0.6 mg total) by mouth daily. X 2 weeks Qty: 30 tablet, Refills: 00    pantoprazole (PROTONIX) 40 MG tablet Take 1 tablet (40 mg total) by mouth 2 (two) times daily. Qty: 60 tablet, Refills: 2      CONTINUE these medications which have NOT CHANGED   Details  benztropine (COGENTIN) 0.5 MG tablet Take 1 tablet (0.5 mg total) by mouth 2 (two) times daily as needed for tremors. Qty: 30 tablet, Refills: 2    divalproex (DEPAKOTE) 500 MG DR tablet Take 500 mg by mouth 2 (two) times daily.    enalapril (VASOTEC) 10 MG tablet Take 10 mg by mouth daily.    FLUoxetine (PROZAC) 20 MG capsule Take 20 mg by mouth daily.    gabapentin (NEURONTIN) 400 MG capsule Take 1 capsule (400 mg total) by mouth 3 (three) times daily. Qty: 60 capsule, Refills: 3    methocarbamol (ROBAXIN) 500 MG tablet Take 1 tablet (500 mg total) by mouth every 6 (six) hours as needed for muscle spasms. Qty: 60 tablet, Refills: 2    QUEtiapine (SEROQUEL) 50 MG tablet Take 50 mg by mouth 4 (four) times daily as needed (for agitation).     SEROQUEL XR 400 MG 24 hr tablet Take 400 mg by mouth at bedtime.  Refills: 2    tamsulosin (FLOMAX) 0.4 MG CAPS capsule Take 0.4 mg by mouth daily after breakfast.    traMADol (ULTRAM) 50 MG tablet Take 50 mg by mouth every 6 (six) hours as needed for moderate pain.      STOP taking these medications     aspirin 325 MG tablet      meloxicam (MOBIC) 15 MG tablet          DISCHARGE INSTRUCTIONS:   1. PCP f/u in 1-2 weeks, repeat CBC at the time recommended 2. GI f/u for EGD and colonoscopy recommended in 1 week 3. Cardiology f/u in 1-2 weeks  If you experience worsening of your admission symptoms, develop shortness of breath, life threatening emergency, suicidal or homicidal thoughts you must seek medical attention immediately by calling 911 or calling your MD immediately  if symptoms less  severe.  You Must read complete instructions/literature along with all the possible adverse reactions/side effects for all the Medicines you take and that have been prescribed to you. Take any new Medicines after you have completely understood and accept all the possible adverse reactions/side effects.   Please note  You were cared for by a hospitalist during your hospital stay. If you have any questions about your discharge medications or the care you received while you were in the hospital after you are discharged, you can call the unit and asked to speak with the hospitalist on call if the hospitalist that took care of you is not available. Once you are discharged, your primary care physician will handle any further medical issues. Please note that NO REFILLS for any discharge medications will be authorized once you are discharged, as it is imperative that you return to your primary care physician (or establish a relationship with a primary care physician if you do  not have one) for your aftercare needs so that they can reassess your need for medications and monitor your lab values.    Today   CHIEF COMPLAINT:   Chief Complaint  Patient presents with  . Chest Pain  . Shortness of Breath    VITAL SIGNS:  Blood pressure 130/88, pulse 98, temperature 97.8 F (36.6 C), temperature source Oral, resp. rate 21, height 6' (1.829 m), weight 83.235 kg (183 lb 8 oz), SpO2 97 %.  I/O:   Intake/Output Summary (Last 24 hours) at 06/02/15 1505 Last data filed at 06/02/15 1403  Gross per 24 hour  Intake    240 ml  Output      0 ml  Net    240 ml    PHYSICAL EXAMINATION:   Physical Exam  GENERAL:  55 y.o.-year-old patient lying in the bed with no acute distress.  EYES: Pupils equal, round, reactive to light and accommodation. No scleral icterus. Extraocular muscles intact.  HEENT: Head atraumatic, normocephalic. Oropharynx and nasopharynx clear.  NECK:  Supple, no jugular venous  distention. No thyroid enlargement, no tenderness.  LUNGS: Normal breath sounds bilaterally, no wheezing, rales,rhonchi or crepitation. No use of accessory muscles of respiration.  CARDIOVASCULAR: S1, S2 normal. No murmurs, rubs, or gallops.  ABDOMEN: Soft, non-tender, non-distended. Bowel sounds present. No organomegaly or mass.  EXTREMITIES: No pedal edema, cyanosis, or clubbing.  NEUROLOGIC: Cranial nerves II through XII are intact. Muscle strength 5/5 in all extremities. Sensation intact. Gait not checked.  PSYCHIATRIC: The patient is alert and oriented x 3.  SKIN: No obvious rash, lesion, or ulcer.   DATA REVIEW:   CBC  Recent Labs Lab 06/02/15 0315  WBC 5.8  HGB 8.6*  HCT 25.5*  PLT 283    Chemistries   Recent Labs Lab 06/01/15 2101 06/02/15 0315  NA  --  138  K  --  3.8  CL  --  104  CO2  --  26  GLUCOSE  --  108*  BUN  --  10  CREATININE  --  0.91  CALCIUM  --  8.9  MG 2.4  --   AST  --  11*  ALT  --  12*  ALKPHOS  --  62  BILITOT  --  0.3    Cardiac Enzymes  Recent Labs Lab 06/02/15 0315  TROPONINI <0.03    Microbiology Results  Results for orders placed or performed during the hospital encounter of 05/12/15  C difficile quick scan w PCR reflex     Status: None   Collection Time: 05/14/15 12:54 AM  Result Value Ref Range Status   C Diff antigen NEGATIVE NEGATIVE Final   C Diff toxin NEGATIVE NEGATIVE Final   C Diff interpretation Negative for C. difficile  Final    RADIOLOGY:  Dg Chest 2 View  06/01/2015  CLINICAL DATA:  Chest pain.  Shortness of breath EXAM: CHEST  2 VIEW COMPARISON:  CT 05/12/2015. FINDINGS: Mediastinum hilar structures normal. Heart size normal. Mild bilateral from interstitial prominence noted. Mild pneumonitis cannot be excluded. No pleural effusion or pneumothorax . IMPRESSION: Mild bilateral pulmonary interstitial prominence, mild pneumonitis cannot be excluded. Electronically Signed   By: Maisie Fus  Register   On:  06/01/2015 09:03   Ct Angio Chest Pe W/cm &/or Wo Cm  06/01/2015  CLINICAL DATA:  Bilateral chest pain. History of recent pericarditis. EXAM: CT ANGIOGRAPHY CHEST WITH CONTRAST TECHNIQUE: Multidetector CT imaging of the chest was performed using the standard protocol  during bolus administration of intravenous contrast. Multiplanar CT image reconstructions and MIPs were obtained to evaluate the vascular anatomy. CONTRAST:  Not reported. COMPARISON:  CT of the chest 05/12/2015 FINDINGS: Mediastinum/Lymph Nodes: No pulmonary emboli or thoracic aortic dissection identified. No masses or pathologically enlarged lymph nodes identified. There is decreased in size however complex in appearance pericardial effusion with maximum thickness of 8 mm. There is straightening of the interventricular septum, raising the possibility of constrictive pericarditis. There is a reflux of contrast into inferior vena cava and hepatic veins supporting right heart strain. Heavily calcified atherosclerotic disease of the coronary arteries is seen. There is mild enlargement of the mediastinal lymph nodes, the largest left prevascular lymph node measures 10 mm in short axis. Lungs/Pleura: There is left lung base airspace consolidation versus atelectasis. Upper abdomen: No acute findings. Musculoskeletal: No chest wall mass or suspicious bone lesions identified. Review of the MIP images confirms the above findings. IMPRESSION: Moderate in size complex pericardial effusion with maximum thickness of 8 mm, with straightening of the interventricular septum, raising the possibility of constrictive pericarditis, and evidence of right heart strain. Advanced calcific atherosclerotic disease of the coronary arteries. Mildly enlarged mediastinal lymph nodes. Left lung base airspace consolidation versus atelectasis. No evidence of pulmonary embolus. These results will be called to the ordering clinician or representative by the Radiologist Assistant, and  communication documented in the PACS or zVision Dashboard. Electronically Signed   By: Ted Mcalpine M.D.   On: 06/01/2015 16:07   Nm Myocar Multi W/spect W/wall Motion / Ef  06/02/2015  Pharmacological myocardial perfusion imaging study with no significant  ischemia Normal wall motion, EF estimated at 73% No EKG changes concerning for ischemia at peak stress or in recovery. Low risk scan Signed, Dossie Arbour, MD, Ph.D [facility]    EKG:   Orders placed or performed during the hospital encounter of 06/01/15  . EKG 12-Lead  . EKG 12-Lead  . ED EKG within 10 minutes  . ED EKG within 10 minutes      Management plans discussed with the patient, family and they are in agreement.  CODE STATUS:     Code Status Orders        Start     Ordered   06/01/15 1848  Full code   Continuous     06/01/15 1847    Code Status History    Date Active Date Inactive Code Status Order ID Comments User Context   05/13/2015  1:11 AM 05/14/2015 12:55 PM Full Code [id number]  Ihor Austin, MD Inpatient      TOTAL TIME TAKING CARE OF THIS PATIENT: 37  minutes.    Enid Baas M.D on 06/02/2015 at 3:05 PM  Between 7am to 6pm - Pager - [phone]  After 6pm go to [email or web address] - password [email or web address] [facility]  [place]  [facility]  Office  [phone]  CC: Primary care physician; [facility]

## 2015-06-02 NOTE — Progress Notes (Signed)
Pt discharged to home after negative stress test.  Will follow up outpatient with GI for EGD/Colonoscopy.  [facility]  Patient left hospital with family/friend

## 2015-06-02 NOTE — Telephone Encounter (Signed)
TCM ph [facility] for chf  Scheduled with Dr. Herbie BaltimoreHarding 06-10-15 at 3:15 pm

## 2015-06-02 NOTE — Progress Notes (Signed)
Patient: [name] / Admit Date: 06/01/2015 / Date of Encounter: 06/02/2015, 7:28 AM   Subjective: 6/10 right-sided chest pain this morning. No associated symptoms. Has headache this morning. He gets headaches frequently. No nitro patch on. He is for nuclear stress test today.  Review of Systems: Review of Systems  Constitutional: Positive for malaise/fatigue. Negative for fever, chills, weight loss and diaphoresis.  HENT: Negative for congestion.   Eyes: Negative for discharge and redness.  Respiratory: Negative for cough, hemoptysis, sputum production, shortness of breath and wheezing.   Cardiovascular: Positive for chest pain. Negative for palpitations, orthopnea, claudication, leg swelling and PND.  Gastrointestinal: Positive for melena. Negative for heartburn, nausea, vomiting, abdominal pain, diarrhea, constipation and blood in stool.       H/o melena. Heme negative in the ED on 5/22  Genitourinary: Negative for hematuria.  Musculoskeletal: Negative for myalgias and falls.  Skin: Negative for rash.  Neurological: Positive for weakness and headaches. Negative for dizziness, tingling, tremors, sensory change, speech change, focal weakness and loss of consciousness.  Endo/Heme/Allergies: Does not bruise/bleed easily.  Psychiatric/Behavioral: Negative for substance abuse. The patient is not nervous/anxious.   All other systems reviewed and are negative.    Objective: Telemetry: NSR, 80's bpm Physical Exam: Blood pressure 111/67, pulse 82, temperature 97.6 F (36.4 C), temperature source Oral, resp. rate 16, height 6' (1.829 m), weight 183 lb 8 oz (83.235 kg), SpO2 97 %. Body mass index is 24.88 kg/(m^2). General: Well developed, well nourished, in no acute distress. Head: Normocephalic, atraumatic, sclera non-icteric, no xanthomas, nares are without discharge. Neck: Negative for carotid bruits. JVP not elevated. Lungs: Clear bilaterally to auscultation without wheezes, rales,  or rhonchi. Breathing is unlabored. Heart: RRR S1 S2 without murmurs, rubs, or gallops.  Abdomen: Soft, non-tender, non-distended with normoactive bowel sounds. No rebound/guarding. Extremities: No clubbing or cyanosis. No edema. Distal pedal pulses are 2+ and equal bilaterally. Neuro: Alert and oriented X 3. Moves all extremities spontaneously. Psych:  Responds to questions appropriately with a normal affect.   Intake/Output Summary (Last 24 hours) at 06/02/15 0728 Last data filed at 06/02/15 0152  Gross per 24 hour  Intake      0 ml  Output      0 ml  Net      0 ml    Inpatient Medications:  . azithromycin  250 mg Oral Daily  . cefTRIAXone (ROCEPHIN)  IV  1 g Intravenous Q24H  . colchicine  0.6 mg Oral BID  . divalproex  500 mg Oral BID  . docusate sodium  100 mg Oral BID  . enalapril  10 mg Oral Daily  . enoxaparin (LOVENOX) injection  40 mg Subcutaneous Q24H  . FLUoxetine  20 mg Oral Daily  . gabapentin  400 mg Oral TID  . pantoprazole  40 mg Oral Daily  . QUEtiapine  400 mg Oral QHS  . tamsulosin  0.4 mg Oral QPC breakfast   Infusions:  . sodium chloride 100 mL/hr at 06/02/15 0455    Labs:  Recent Labs  06/01/15 0913 06/01/15 2101 06/02/15 0315  NA 136  --  138  K 3.9  --  3.8  CL 108  --  104  CO2 22  --  26  GLUCOSE 116*  --  108*  BUN 13  --  10  CREATININE 0.89  --  0.91  CALCIUM 9.0  --  8.9  MG  --  2.4  --  Recent Labs  06/02/15 0315  AST 11*  ALT 12*  ALKPHOS 62  BILITOT 0.3  PROT 6.9  ALBUMIN 3.2*    Recent Labs  06/01/15 0913 06/02/15 0315  WBC 6.7 5.8  HGB 8.8* 8.6*  HCT 26.4* 25.5*  MCV 84.0 82.7  PLT 276 283    Recent Labs  06/01/15 0913 06/01/15 2101 06/02/15 0315  TROPONINI <0.03 <0.03 <0.03   Invalid input(s): POCBNP No results for input(s): HGBA1C in the last 72 hours.   Weights: Filed Weights   06/01/15 0842 06/01/15 1800 06/01/15 1848  Weight: 188 lb (85.276 kg) 183 lb (83.008 kg) 183 lb 8 oz (83.235 kg)      Radiology/Studies:  Dg Chest 2 View  06/01/2015  CLINICAL DATA:  Chest pain.  Shortness of breath EXAM: CHEST  2 VIEW COMPARISON:  CT 05/12/2015. FINDINGS: Mediastinum hilar structures normal. Heart size normal. Mild bilateral from interstitial prominence noted. Mild pneumonitis cannot be excluded. No pleural effusion or pneumothorax . IMPRESSION: Mild bilateral pulmonary interstitial prominence, mild pneumonitis cannot be excluded. Electronically Signed   By: Maisie Fus  Register   On: 06/01/2015 09:03   Ct Angio Chest Pe W/cm &/or Wo Cm  06/01/2015  CLINICAL DATA:  Bilateral chest pain. History of recent pericarditis. EXAM: CT ANGIOGRAPHY CHEST WITH CONTRAST TECHNIQUE: Multidetector CT imaging of the chest was performed using the standard protocol during bolus administration of intravenous contrast. Multiplanar CT image reconstructions and MIPs were obtained to evaluate the vascular anatomy. CONTRAST:  Not reported. COMPARISON:  CT of the chest 05/12/2015 FINDINGS: Mediastinum/Lymph Nodes: No pulmonary emboli or thoracic aortic dissection identified. No masses or pathologically enlarged lymph nodes identified. There is decreased in size however complex in appearance pericardial effusion with maximum thickness of 8 mm. There is straightening of the interventricular septum, raising the possibility of constrictive pericarditis. There is a reflux of contrast into inferior vena cava and hepatic veins supporting right heart strain. Heavily calcified atherosclerotic disease of the coronary arteries is seen. There is mild enlargement of the mediastinal lymph nodes, the largest left prevascular lymph node measures 10 mm in short axis. Lungs/Pleura: There is left lung base airspace consolidation versus atelectasis. Upper abdomen: No acute findings. Musculoskeletal: No chest wall mass or suspicious bone lesions identified. Review of the MIP images confirms the above findings. IMPRESSION: Moderate in size complex  pericardial effusion with maximum thickness of 8 mm, with straightening of the interventricular septum, raising the possibility of constrictive pericarditis, and evidence of right heart strain. Advanced calcific atherosclerotic disease of the coronary arteries. Mildly enlarged mediastinal lymph nodes. Left lung base airspace consolidation versus atelectasis. No evidence of pulmonary embolus. These results will be called to the ordering clinician or representative by the Radiologist Assistant, and communication documented in the PACS or zVision Dashboard. Electronically Signed   By: Ted Mcalpine M.D.   On: 06/01/2015 16:07   Ct Angio Chest Pe W/cm &/or Wo Cm  05/12/2015  CLINICAL DATA:  Left-sided chest pain beginning today. Nausea, vomiting, and diarrhea. History of kidney stones. Shortness of breath. Right knee replacement surgery 2 weeks ago. EXAM: CT ANGIOGRAPHY CHEST CT ABDOMEN AND PELVIS WITH CONTRAST TECHNIQUE: Multidetector CT imaging of the chest was performed using the standard protocol during bolus administration of intravenous contrast. Multiplanar CT image reconstructions and MIPs were obtained to evaluate the vascular anatomy. Multidetector CT imaging of the abdomen and pelvis was performed using the standard protocol during bolus administration of intravenous contrast. CONTRAST:  175 mL Isovue 370.  Patient was initially injected with 100 mL, resulting in suboptimal opacification of the pulmonary arteries. Patient was reinjected 75 mL and rescanned. COMPARISON:  02/01/2007 FINDINGS: CTA CHEST FINDINGS Technically adequate study with moderately good opacification of the central and segmental pulmonary arteries. No focal filling defects are demonstrated. No evidence of significant pulmonary embolus. Normal heart size. Moderate size pericardial effusion. Coronary artery calcifications. Normal caliber thoracic aorta. Great vessel origins are patent. No evidence of aortic dissection. Scattered lymph  nodes in the mediastinum and hilar regions are not pathologically enlarged. The esophagus is decompressed. The mild emphysematous changes in the upper lungs. No focal airspace disease or consolidation. No pleural effusions. No pneumothorax. Airways appear patent. CT ABDOMEN and PELVIS FINDINGS The liver, spleen, gallbladder, pancreas, adrenal glands, abdominal aorta, inferior vena cava, and retroperitoneal lymph nodes are unremarkable. Nonobstructing stone in the midpole of the right kidney. No hydronephrosis or hydroureter. Renal nephrograms are symmetrical. Stomach, small bowel, and colon are not abnormally distended. No free air or free fluid in the abdomen. Small umbilical hernia containing fat. Pelvis: The appendix is normal. Bladder wall is not thickened. Prostate gland is mildly enlarged at 3.7 cm diameter. No free or loculated pelvic fluid collections. No pelvic mass or lymphadenopathy. No evidence of diverticulitis. No destructive bone lesions. Review of the MIP images confirms the above findings. IMPRESSION: No evidence of significant pulmonary embolus. Moderate-sized pericardial effusion. Nonobstructing stone in the right kidney. No evidence of bowel obstruction or inflammation. Electronically Signed   By: Burman NievesWilliam  Stevens M.D.   On: 05/12/2015 22:42   Ct Abdomen Pelvis W Contrast  05/12/2015  CLINICAL DATA:  Left-sided chest pain beginning today. Nausea, vomiting, and diarrhea. History of kidney stones. Shortness of breath. Right knee replacement surgery 2 weeks ago. EXAM: CT ANGIOGRAPHY CHEST CT ABDOMEN AND PELVIS WITH CONTRAST TECHNIQUE: Multidetector CT imaging of the chest was performed using the standard protocol during bolus administration of intravenous contrast. Multiplanar CT image reconstructions and MIPs were obtained to evaluate the vascular anatomy. Multidetector CT imaging of the abdomen and pelvis was performed using the standard protocol during bolus administration of intravenous  contrast. CONTRAST:  175 mL Isovue 370. Patient was initially injected with 100 mL, resulting in suboptimal opacification of the pulmonary arteries. Patient was reinjected 75 mL and rescanned. COMPARISON:  02/01/2007 FINDINGS: CTA CHEST FINDINGS Technically adequate study with moderately good opacification of the central and segmental pulmonary arteries. No focal filling defects are demonstrated. No evidence of significant pulmonary embolus. Normal heart size. Moderate size pericardial effusion. Coronary artery calcifications. Normal caliber thoracic aorta. Great vessel origins are patent. No evidence of aortic dissection. Scattered lymph nodes in the mediastinum and hilar regions are not pathologically enlarged. The esophagus is decompressed. The mild emphysematous changes in the upper lungs. No focal airspace disease or consolidation. No pleural effusions. No pneumothorax. Airways appear patent. CT ABDOMEN and PELVIS FINDINGS The liver, spleen, gallbladder, pancreas, adrenal glands, abdominal aorta, inferior vena cava, and retroperitoneal lymph nodes are unremarkable. Nonobstructing stone in the midpole of the right kidney. No hydronephrosis or hydroureter. Renal nephrograms are symmetrical. Stomach, small bowel, and colon are not abnormally distended. No free air or free fluid in the abdomen. Small umbilical hernia containing fat. Pelvis: The appendix is normal. Bladder wall is not thickened. Prostate gland is mildly enlarged at 3.7 cm diameter. No free or loculated pelvic fluid collections. No pelvic mass or lymphadenopathy. No evidence of diverticulitis. No destructive bone lesions. Review of the MIP images confirms the above  findings. IMPRESSION: No evidence of significant pulmonary embolus. Moderate-sized pericardial effusion. Nonobstructing stone in the right kidney. No evidence of bowel obstruction or inflammation. Electronically Signed   By: Burman Nieves M.D.   On: 05/12/2015 22:42     Assessment  and Plan   Active Problems:   Pleuritic chest pain   Pericarditis with effusion   Anemia  1. Chest pain with moderate risk of cardiac etiology: -Atypical features -Troponin negative x 4 -He is for Lexiscan Myoview this morning -Currently chest pain free -Repeat echo with normal LVSF, wall motion, and improved pericardial effusion without evidence of hemodynamic compromise   2. History of pericardial effusion: -Improved -Given his anemia (prior hgb of 12 last month) with current hgb of 8.8 would avoid NSAIDs and treat with colchicine 0.6 mg bid only at this time -Repeat echo as above with normal LVSF and improved pericardial effusion with no evidence of cardiac tamponade  3. Possible pneumonitis: -Repeat CTA chest negative for PE -May still be dealing with his vial infection from earlier in May -Supportive care  4. Acute anemia: -History of melena per patient report x 1 year -Hgb 8.8 upon admission when he was 12.3 on 04/22/2015 -Hgb 8.6 on 5/23 -Has colonoscopy referral, just has not made appointment yet -Heme negative in the ED -Maintain hgb > 8.5 -Per IM/GI -Avoid NSAIDs as above -PPI  5. Kidney stones: -Stable  6. Fatigue: -Likely explained by #4   SignedEula Listen, PA-C Pager: [phone] 06/02/2015, 7:28 AM

## 2015-06-03 LAB — IRON AND TIBC
IRON: 12 ug/dL — AB (ref 45–182)
Saturation Ratios: 5 % — ABNORMAL LOW (ref 17.9–39.5)
TIBC: 264 ug/dL (ref 250–450)
UIBC: 252 ug/dL

## 2015-06-03 LAB — FERRITIN: FERRITIN: 71 ng/mL (ref 24–336)

## 2015-06-03 LAB — FOLATE: Folate: 23.3 ng/mL (ref >5.9)

## 2015-06-03 NOTE — Telephone Encounter (Signed)
[facility].  Patient understands to follow up with provider Herbie BaltimoreHarding on 5/31 at [facility]. Patient understands discharge instructions? yes Patient understands medications and regiment? yes Patient understands to bring all medications to this visit? yes

## 2015-06-10 ENCOUNTER — Encounter: Payer: Medicaid Other | Admitting: Cardiology

## 2015-06-17 ENCOUNTER — Ambulatory Visit: Payer: Self-pay | Admitting: Gastroenterology

## 2015-07-01 ENCOUNTER — Encounter: Payer: Medicaid Other | Admitting: Cardiology

## 2015-08-05 ENCOUNTER — Encounter: Payer: Medicaid Other | Admitting: Cardiology

## 2015-12-24 ENCOUNTER — Encounter: Payer: Self-pay | Admitting: Emergency Medicine

## 2015-12-24 ENCOUNTER — Emergency Department
Admission: EM | Admit: 2015-12-24 | Discharge: 2015-12-24 | Discharge status: Against Medical Advice | Payer: Medicaid Other | Attending: Emergency Medicine | Admitting: Emergency Medicine

## 2015-12-24 DIAGNOSIS — Z87891 Personal history of nicotine dependence: Secondary | ICD-10-CM | POA: Diagnosis not present

## 2015-12-24 DIAGNOSIS — R109 Unspecified abdominal pain: Secondary | ICD-10-CM | POA: Diagnosis not present

## 2015-12-24 DIAGNOSIS — I10 Essential (primary) hypertension: Secondary | ICD-10-CM | POA: Insufficient documentation

## 2015-12-24 DIAGNOSIS — Z79899 Other long term (current) drug therapy: Secondary | ICD-10-CM | POA: Diagnosis not present

## 2015-12-24 DIAGNOSIS — Z5321 Procedure and treatment not carried out due to patient leaving prior to being seen by health care provider: Secondary | ICD-10-CM | POA: Insufficient documentation

## 2015-12-24 LAB — CBC
HCT: 44.7 % (ref 40.0–52.0)
HEMOGLOBIN: 15.4 g/dL (ref 13.0–18.0)
MCH: 31.3 pg (ref 26.0–34.0)
MCHC: 34.4 g/dL (ref 32.0–36.0)
MCV: 91.2 fL (ref 80.0–100.0)
PLATELETS: 232 10*3/uL (ref 150–440)
RBC: 4.9 MIL/uL (ref 4.40–5.90)
RDW: 14.1 % (ref 11.5–14.5)
WBC: 6.2 10*3/uL (ref 3.8–10.6)

## 2015-12-24 LAB — COMPREHENSIVE METABOLIC PANEL
ALBUMIN: 4.6 g/dL (ref 3.5–5.0)
ALT: 18 U/L (ref 17–63)
AST: 23 U/L (ref 15–41)
Alkaline Phosphatase: 66 U/L (ref 38–126)
Anion gap: 9 (ref 5–15)
BUN: 13 mg/dL (ref 6–20)
CHLORIDE: 109 mmol/L (ref 101–111)
CO2: 21 mmol/L — AB (ref 22–32)
CREATININE: 1.1 mg/dL (ref 0.61–1.24)
Calcium: 10.1 mg/dL (ref 8.9–10.3)
GFR calc non Af Amer: >60 mL/min (ref >60)
GLUCOSE: 116 mg/dL — AB (ref 65–99)
Potassium: 4.3 mmol/L (ref 3.5–5.1)
SODIUM: 139 mmol/L (ref 135–145)
Total Bilirubin: 0.5 mg/dL (ref 0.3–1.2)
Total Protein: 7.4 g/dL (ref 6.5–8.1)

## 2015-12-24 LAB — LIPASE, BLOOD: LIPASE: 28 U/L (ref 11–51)

## 2015-12-24 MED ORDER — ONDANSETRON 4 MG PO TBDP
ORAL_TABLET | ORAL | Status: AC
  Administered 2015-12-24: 4 mg via ORAL
  Filled 2015-12-24: qty 1

## 2015-12-24 MED ORDER — OXYCODONE-ACETAMINOPHEN 5-325 MG PO TABS
ORAL_TABLET | ORAL | Status: AC
  Administered 2015-12-24: 1 via ORAL
  Filled 2015-12-24: qty 1

## 2015-12-24 MED ORDER — OXYCODONE-ACETAMINOPHEN 5-325 MG PO TABS
1.0000 | ORAL_TABLET | ORAL | Status: DC | PRN
  Administered 2015-12-24: 1 via ORAL

## 2015-12-24 MED ORDER — ONDANSETRON 4 MG PO TBDP
4.0000 mg | ORAL_TABLET | Freq: Once | ORAL | Status: AC | PRN
  Administered 2015-12-24: 4 mg via ORAL

## 2015-12-24 NOTE — ED Notes (Signed)
Called patient from lobby.  Not in lobby at this time.  Will check again in a few minutes in case he is in bathroom.

## 2015-12-24 NOTE — ED Triage Notes (Signed)
Pt c/o sudden onset sharp R sided flank pain that started this morning. Pt states hx of kidney stones and states pain feels the same. Pt states +N/V.

## 2016-01-06 ENCOUNTER — Observation Stay
Admission: EM | Admit: 2016-01-06 | Discharge: 2016-01-12 | Disposition: A | Discharge status: Home / Self Care | Payer: Medicaid Other | Attending: Internal Medicine | Admitting: Internal Medicine

## 2016-01-06 ENCOUNTER — Encounter: Payer: Self-pay | Admitting: Emergency Medicine

## 2016-01-06 ENCOUNTER — Emergency Department: Payer: Medicaid Other

## 2016-01-06 DIAGNOSIS — D649 Anemia, unspecified: Secondary | ICD-10-CM | POA: Insufficient documentation

## 2016-01-06 DIAGNOSIS — K429 Umbilical hernia without obstruction or gangrene: Secondary | ICD-10-CM | POA: Diagnosis not present

## 2016-01-06 DIAGNOSIS — Z885 Allergy status to narcotic agent status: Secondary | ICD-10-CM | POA: Insufficient documentation

## 2016-01-06 DIAGNOSIS — F4310 Post-traumatic stress disorder, unspecified: Secondary | ICD-10-CM | POA: Insufficient documentation

## 2016-01-06 DIAGNOSIS — M19041 Primary osteoarthritis, right hand: Secondary | ICD-10-CM | POA: Insufficient documentation

## 2016-01-06 DIAGNOSIS — F419 Anxiety disorder, unspecified: Secondary | ICD-10-CM | POA: Insufficient documentation

## 2016-01-06 DIAGNOSIS — I319 Disease of pericardium, unspecified: Secondary | ICD-10-CM | POA: Insufficient documentation

## 2016-01-06 DIAGNOSIS — G2581 Restless legs syndrome: Secondary | ICD-10-CM | POA: Insufficient documentation

## 2016-01-06 DIAGNOSIS — E876 Hypokalemia: Secondary | ICD-10-CM | POA: Diagnosis not present

## 2016-01-06 DIAGNOSIS — K449 Diaphragmatic hernia without obstruction or gangrene: Secondary | ICD-10-CM | POA: Diagnosis not present

## 2016-01-06 DIAGNOSIS — Z7982 Long term (current) use of aspirin: Secondary | ICD-10-CM | POA: Insufficient documentation

## 2016-01-06 DIAGNOSIS — K2210 Ulcer of esophagus without bleeding: Principal | ICD-10-CM | POA: Insufficient documentation

## 2016-01-06 DIAGNOSIS — M19042 Primary osteoarthritis, left hand: Secondary | ICD-10-CM | POA: Diagnosis not present

## 2016-01-06 DIAGNOSIS — R9431 Abnormal electrocardiogram [ECG] [EKG]: Secondary | ICD-10-CM

## 2016-01-06 DIAGNOSIS — D72829 Elevated white blood cell count, unspecified: Secondary | ICD-10-CM | POA: Diagnosis not present

## 2016-01-06 DIAGNOSIS — R1013 Epigastric pain: Secondary | ICD-10-CM | POA: Diagnosis present

## 2016-01-06 DIAGNOSIS — Z87442 Personal history of urinary calculi: Secondary | ICD-10-CM | POA: Diagnosis not present

## 2016-01-06 DIAGNOSIS — M170 Bilateral primary osteoarthritis of knee: Secondary | ICD-10-CM | POA: Insufficient documentation

## 2016-01-06 DIAGNOSIS — F319 Bipolar disorder, unspecified: Secondary | ICD-10-CM | POA: Insufficient documentation

## 2016-01-06 DIAGNOSIS — B182 Chronic viral hepatitis C: Secondary | ICD-10-CM | POA: Insufficient documentation

## 2016-01-06 DIAGNOSIS — I10 Essential (primary) hypertension: Secondary | ICD-10-CM | POA: Diagnosis not present

## 2016-01-06 DIAGNOSIS — Z9889 Other specified postprocedural states: Secondary | ICD-10-CM

## 2016-01-06 DIAGNOSIS — E785 Hyperlipidemia, unspecified: Secondary | ICD-10-CM | POA: Diagnosis not present

## 2016-01-06 DIAGNOSIS — N281 Cyst of kidney, acquired: Secondary | ICD-10-CM | POA: Diagnosis not present

## 2016-01-06 DIAGNOSIS — K2900 Acute gastritis without bleeding: Secondary | ICD-10-CM | POA: Insufficient documentation

## 2016-01-06 DIAGNOSIS — I081 Rheumatic disorders of both mitral and tricuspid valves: Secondary | ICD-10-CM | POA: Diagnosis not present

## 2016-01-06 DIAGNOSIS — I2510 Atherosclerotic heart disease of native coronary artery without angina pectoris: Secondary | ICD-10-CM | POA: Diagnosis not present

## 2016-01-06 DIAGNOSIS — K219 Gastro-esophageal reflux disease without esophagitis: Secondary | ICD-10-CM | POA: Insufficient documentation

## 2016-01-06 DIAGNOSIS — Z87891 Personal history of nicotine dependence: Secondary | ICD-10-CM | POA: Insufficient documentation

## 2016-01-06 DIAGNOSIS — N400 Enlarged prostate without lower urinary tract symptoms: Secondary | ICD-10-CM | POA: Diagnosis not present

## 2016-01-06 DIAGNOSIS — R1310 Dysphagia, unspecified: Secondary | ICD-10-CM

## 2016-01-06 DIAGNOSIS — R109 Unspecified abdominal pain: Secondary | ICD-10-CM | POA: Diagnosis present

## 2016-01-06 DIAGNOSIS — K209 Esophagitis, unspecified without bleeding: Secondary | ICD-10-CM

## 2016-01-06 DIAGNOSIS — R112 Nausea with vomiting, unspecified: Secondary | ICD-10-CM | POA: Diagnosis present

## 2016-01-06 LAB — COMPREHENSIVE METABOLIC PANEL
ALT: 22 U/L (ref 17–63)
ANION GAP: 14 (ref 5–15)
AST: 27 U/L (ref 15–41)
Albumin: 4.8 g/dL (ref 3.5–5.0)
Alkaline Phosphatase: 78 U/L (ref 38–126)
BUN: 20 mg/dL (ref 6–20)
CHLORIDE: 103 mmol/L (ref 101–111)
CO2: 22 mmol/L (ref 22–32)
Calcium: 10.7 mg/dL — ABNORMAL HIGH (ref 8.9–10.3)
Creatinine, Ser: 1.13 mg/dL (ref 0.61–1.24)
GFR calc Af Amer: >60 mL/min (ref >60)
Glucose, Bld: 131 mg/dL — ABNORMAL HIGH (ref 65–99)
POTASSIUM: 3.8 mmol/L (ref 3.5–5.1)
Sodium: 139 mmol/L (ref 135–145)
TOTAL PROTEIN: 8.9 g/dL — AB (ref 6.5–8.1)
Total Bilirubin: 0.8 mg/dL (ref 0.3–1.2)

## 2016-01-06 LAB — URINALYSIS, COMPLETE (UACMP) WITH MICROSCOPIC
Bacteria, UA: NONE SEEN
Bilirubin Urine: NEGATIVE
GLUCOSE, UA: NEGATIVE mg/dL
HGB URINE DIPSTICK: NEGATIVE
Ketones, ur: 20 mg/dL — AB
Leukocytes, UA: NEGATIVE
NITRITE: NEGATIVE
PH: 7 (ref 5.0–8.0)
Protein, ur: 100 mg/dL — AB
SPECIFIC GRAVITY, URINE: 1.035 — AB (ref 1.005–1.030)
Squamous Epithelial / LPF: NONE SEEN

## 2016-01-06 LAB — CBC WITH DIFFERENTIAL/PLATELET
BASOS ABS: 0 10*3/uL (ref 0–0.1)
BASOS PCT: 0 %
EOS PCT: 0 %
Eosinophils Absolute: 0 10*3/uL (ref 0–0.7)
HCT: 45.3 % (ref 40.0–52.0)
Hemoglobin: 15.5 g/dL (ref 13.0–18.0)
Lymphocytes Relative: 12 %
Lymphs Abs: 1.8 10*3/uL (ref 1.0–3.6)
MCH: 31 pg (ref 26.0–34.0)
MCHC: 34.2 g/dL (ref 32.0–36.0)
MCV: 90.8 fL (ref 80.0–100.0)
MONO ABS: 1.1 10*3/uL — AB (ref 0.2–1.0)
MONOS PCT: 7 %
Neutro Abs: 12.4 10*3/uL — ABNORMAL HIGH (ref 1.4–6.5)
Neutrophils Relative %: 81 %
PLATELETS: 379 10*3/uL (ref 150–440)
RBC: 5 MIL/uL (ref 4.40–5.90)
RDW: 14 % (ref 11.5–14.5)
WBC: 15.3 10*3/uL — ABNORMAL HIGH (ref 3.8–10.6)

## 2016-01-06 LAB — LIPASE, BLOOD: LIPASE: 19 U/L (ref 11–51)

## 2016-01-06 LAB — TROPONIN I
Troponin I: <0.03 ng/mL (ref <0.03)
Troponin I: <0.03 ng/mL (ref <0.03)

## 2016-01-06 LAB — INFLUENZA PANEL BY PCR (TYPE A & B)
INFLAPCR: NEGATIVE
INFLBPCR: NEGATIVE

## 2016-01-06 MED ORDER — GI COCKTAIL ~~LOC~~
30.0000 mL | Freq: Once | ORAL | Status: AC
  Administered 2016-01-06: 30 mL via ORAL
  Filled 2016-01-06: qty 30

## 2016-01-06 MED ORDER — TAMSULOSIN HCL 0.4 MG PO CAPS
0.4000 mg | ORAL_CAPSULE | Freq: Every day | ORAL | Status: DC
  Administered 2016-01-07 – 2016-01-12 (×6): 0.4 mg via ORAL
  Filled 2016-01-06 (×6): qty 1

## 2016-01-06 MED ORDER — IOPAMIDOL (ISOVUE-300) INJECTION 61%
100.0000 mL | Freq: Once | INTRAVENOUS | Status: AC | PRN
  Administered 2016-01-06: 100 mL via INTRAVENOUS

## 2016-01-06 MED ORDER — ACETAMINOPHEN 325 MG PO TABS
650.0000 mg | ORAL_TABLET | Freq: Four times a day (QID) | ORAL | Status: DC | PRN
  Administered 2016-01-07 (×2): 650 mg via ORAL
  Filled 2016-01-06 (×2): qty 2

## 2016-01-06 MED ORDER — GABAPENTIN 400 MG PO CAPS
400.0000 mg | ORAL_CAPSULE | Freq: Three times a day (TID) | ORAL | Status: DC
  Administered 2016-01-07 – 2016-01-12 (×16): 400 mg via ORAL
  Filled 2016-01-06 (×17): qty 1

## 2016-01-06 MED ORDER — ONDANSETRON HCL 4 MG/2ML IJ SOLN
4.0000 mg | Freq: Once | INTRAMUSCULAR | Status: AC
  Administered 2016-01-06: 4 mg via INTRAVENOUS
  Filled 2016-01-06: qty 2

## 2016-01-06 MED ORDER — SODIUM CHLORIDE 0.9 % IV BOLUS (SEPSIS)
1000.0000 mL | Freq: Once | INTRAVENOUS | Status: AC
  Administered 2016-01-06: 1000 mL via INTRAVENOUS

## 2016-01-06 MED ORDER — SUCRALFATE 1 G PO TABS
1.0000 g | ORAL_TABLET | Freq: Three times a day (TID) | ORAL | Status: DC
  Administered 2016-01-07 – 2016-01-10 (×16): 1 g via ORAL
  Filled 2016-01-06 (×16): qty 1

## 2016-01-06 MED ORDER — MAGNESIUM CITRATE PO SOLN
1.0000 | Freq: Once | ORAL | Status: DC | PRN
  Filled 2016-01-06: qty 296

## 2016-01-06 MED ORDER — ONDANSETRON HCL 4 MG/2ML IJ SOLN
4.0000 mg | Freq: Four times a day (QID) | INTRAMUSCULAR | Status: DC | PRN
  Administered 2016-01-06 – 2016-01-10 (×3): 4 mg via INTRAVENOUS
  Filled 2016-01-06 (×3): qty 2

## 2016-01-06 MED ORDER — MORPHINE SULFATE (PF) 4 MG/ML IV SOLN
4.0000 mg | Freq: Once | INTRAVENOUS | Status: AC
  Administered 2016-01-06: 4 mg via INTRAVENOUS
  Filled 2016-01-06: qty 1

## 2016-01-06 MED ORDER — ASPIRIN EC 325 MG PO TBEC
325.0000 mg | DELAYED_RELEASE_TABLET | Freq: Once | ORAL | Status: AC
  Administered 2016-01-06: 325 mg via ORAL
  Filled 2016-01-06: qty 1

## 2016-01-06 MED ORDER — SODIUM CHLORIDE 0.9 % IV SOLN
INTRAVENOUS | Status: DC
  Administered 2016-01-06 – 2016-01-07 (×2): via INTRAVENOUS

## 2016-01-06 MED ORDER — GI COCKTAIL ~~LOC~~
30.0000 mL | Freq: Once | ORAL | Status: AC
  Administered 2016-01-06: 18:00:00 via ORAL

## 2016-01-06 MED ORDER — FLUOXETINE HCL 20 MG PO CAPS
40.0000 mg | ORAL_CAPSULE | Freq: Every day | ORAL | Status: DC
  Administered 2016-01-07 – 2016-01-12 (×6): 40 mg via ORAL
  Filled 2016-01-06 (×6): qty 2

## 2016-01-06 MED ORDER — GI COCKTAIL ~~LOC~~
ORAL | Status: AC
  Filled 2016-01-06: qty 30

## 2016-01-06 MED ORDER — ONDANSETRON HCL 4 MG PO TABS
4.0000 mg | ORAL_TABLET | Freq: Four times a day (QID) | ORAL | Status: DC | PRN
  Administered 2016-01-07: 4 mg via ORAL
  Filled 2016-01-06: qty 1

## 2016-01-06 MED ORDER — METHOCARBAMOL 500 MG PO TABS
500.0000 mg | ORAL_TABLET | Freq: Four times a day (QID) | ORAL | Status: DC | PRN
  Filled 2016-01-06: qty 1

## 2016-01-06 MED ORDER — HYDROMORPHONE HCL 1 MG/ML IJ SOLN
1.0000 mg | Freq: Once | INTRAMUSCULAR | Status: AC
  Administered 2016-01-06: 1 mg via INTRAVENOUS
  Filled 2016-01-06: qty 1

## 2016-01-06 MED ORDER — COLCHICINE 0.6 MG PO TABS
0.6000 mg | ORAL_TABLET | Freq: Every day | ORAL | Status: DC
  Administered 2016-01-07: 0.6 mg via ORAL
  Filled 2016-01-06: qty 1

## 2016-01-06 MED ORDER — METOPROLOL TARTRATE 25 MG PO TABS
25.0000 mg | ORAL_TABLET | Freq: Two times a day (BID) | ORAL | Status: DC
  Administered 2016-01-07 – 2016-01-12 (×10): 25 mg via ORAL
  Filled 2016-01-06 (×11): qty 1

## 2016-01-06 MED ORDER — DIVALPROEX SODIUM 500 MG PO DR TAB
500.0000 mg | DELAYED_RELEASE_TABLET | Freq: Two times a day (BID) | ORAL | Status: DC
  Administered 2016-01-07 – 2016-01-12 (×11): 500 mg via ORAL
  Filled 2016-01-06 (×11): qty 1

## 2016-01-06 MED ORDER — ENOXAPARIN SODIUM 40 MG/0.4ML ~~LOC~~ SOLN
40.0000 mg | SUBCUTANEOUS | Status: DC
  Administered 2016-01-06 – 2016-01-11 (×6): 40 mg via SUBCUTANEOUS
  Filled 2016-01-06 (×6): qty 0.4

## 2016-01-06 MED ORDER — HYDROMORPHONE HCL 1 MG/ML IJ SOLN
1.0000 mg | INTRAMUSCULAR | Status: DC | PRN
  Administered 2016-01-06 – 2016-01-07 (×3): 1 mg via INTRAVENOUS
  Filled 2016-01-06 (×3): qty 1

## 2016-01-06 MED ORDER — QUETIAPINE FUMARATE 25 MG PO TABS
50.0000 mg | ORAL_TABLET | Freq: Four times a day (QID) | ORAL | Status: DC | PRN
  Filled 2016-01-06: qty 2

## 2016-01-06 MED ORDER — BENZTROPINE MESYLATE 0.5 MG PO TABS
0.5000 mg | ORAL_TABLET | Freq: Two times a day (BID) | ORAL | Status: DC | PRN
  Administered 2016-01-07: 0.5 mg via ORAL
  Filled 2016-01-06 (×2): qty 1

## 2016-01-06 MED ORDER — ROSUVASTATIN CALCIUM 10 MG PO TABS
10.0000 mg | ORAL_TABLET | Freq: Every day | ORAL | Status: DC
  Administered 2016-01-07 – 2016-01-12 (×6): 10 mg via ORAL
  Filled 2016-01-06 (×6): qty 1

## 2016-01-06 MED ORDER — QUETIAPINE FUMARATE ER 200 MG PO TB24
400.0000 mg | ORAL_TABLET | Freq: Every day | ORAL | Status: DC
  Administered 2016-01-07 – 2016-01-11 (×5): 400 mg via ORAL
  Filled 2016-01-06 (×5): qty 2
  Filled 2016-01-06 (×2): qty 1

## 2016-01-06 MED ORDER — OXYCODONE HCL 5 MG PO TABS
5.0000 mg | ORAL_TABLET | ORAL | Status: DC | PRN
  Administered 2016-01-06 – 2016-01-07 (×3): 5 mg via ORAL
  Filled 2016-01-06 (×3): qty 1

## 2016-01-06 MED ORDER — SENNOSIDES-DOCUSATE SODIUM 8.6-50 MG PO TABS: 1.0000 | ORAL_TABLET | Freq: Every evening | ORAL | Status: DC | PRN

## 2016-01-06 MED ORDER — BISACODYL 5 MG PO TBEC: 5.0000 mg | DELAYED_RELEASE_TABLET | Freq: Every day | ORAL | Status: DC | PRN

## 2016-01-06 MED ORDER — ACETAMINOPHEN 650 MG RE SUPP: 650.0000 mg | Freq: Four times a day (QID) | RECTAL | Status: DC | PRN

## 2016-01-06 MED ORDER — ONDANSETRON HCL 4 MG/2ML IJ SOLN
4.0000 mg | Freq: Once | INTRAMUSCULAR | Status: AC
Start: 2016-01-06 — End: 2016-01-06
  Administered 2016-01-06: 4 mg via INTRAVENOUS
  Filled 2016-01-06: qty 2

## 2016-01-06 MED ORDER — ALBUTEROL SULFATE (2.5 MG/3ML) 0.083% IN NEBU: 2.5000 mg | INHALATION_SOLUTION | Freq: Four times a day (QID) | RESPIRATORY_TRACT | Status: DC | PRN

## 2016-01-06 MED ORDER — SODIUM CHLORIDE 0.9% FLUSH
3.0000 mL | Freq: Two times a day (BID) | INTRAVENOUS | Status: DC
  Administered 2016-01-07 – 2016-01-12 (×9): 3 mL via INTRAVENOUS

## 2016-01-06 MED ORDER — PANTOPRAZOLE SODIUM 40 MG PO TBEC
40.0000 mg | DELAYED_RELEASE_TABLET | Freq: Two times a day (BID) | ORAL | Status: DC
  Administered 2016-01-07 – 2016-01-10 (×8): 40 mg via ORAL
  Filled 2016-01-06 (×9): qty 1

## 2016-01-06 MED ORDER — IOPAMIDOL (ISOVUE-300) INJECTION 61%
30.0000 mL | Freq: Once | INTRAVENOUS | Status: AC | PRN
  Administered 2016-01-06: 30 mL via ORAL

## 2016-01-06 MED ORDER — ZOLPIDEM TARTRATE 5 MG PO TABS: 5.0000 mg | ORAL_TABLET | Freq: Every evening | ORAL | Status: DC | PRN

## 2016-01-06 MED ORDER — ENALAPRIL MALEATE 10 MG PO TABS
10.0000 mg | ORAL_TABLET | Freq: Every day | ORAL | Status: DC
  Administered 2016-01-07 – 2016-01-12 (×5): 10 mg via ORAL
  Filled 2016-01-06 (×5): qty 1

## 2016-01-06 NOTE — H&P (Addendum)
[facility] - [place] @ [facility] Admission History and Physical [employer], D.O.    Patient Name: [name] MR#: [id number] Date of Birth: [birth year] Date of Admission: 01/06/2016  Referring MD/NP/PA: Dr. Darnelle Catalan Primary Care Physician: [facility] Outpatient Specialists: Dr. Herbie Baltimore (Cardio), Dr. Corine Shelter (GU)  Patient coming from: Home  Chief Complaint: Abdominal pain  HPI: [name] is a 55 y.o. male with a known history of CAD, CHF, pericarditis (5/17),  Hep C, GERD, renal stones, OA, bipolar and PTSD presents to the emergency department for evaluation of abdominal pain.  Patient was in a usual state of health until two days ago when he developed midepigastric abdominal pain described as severe, localized and associated with profuse vomiting and fevers/chills.  Vomitus is non-bloody, non-bilious.  He denies any chest pain, diarrhea, back pain.  He reports that GI cocktail in the ED improved his symptoms, however the pain is now recurring, described as 10/10.  He has no history of PUD and has not seen GI however he was hospitalized in May 2017 with pleuritic chest pain and was found to have pericarditis and CHF by echo.  Myoview was negative for ischemic disease at that time.  He has not followed up with cardiology since.     Otherwise there has been no change in status. Patient has been taking medication as prescribed and there has been no recent change in medication or diet.  There has been no recent illness, travel or sick contacts.    Patient denies fevers/chills, weakness, dizziness, chest pain, shortness of breath,  dysuria/frequency, changes in mental status.   ED Course: Patient received GI cocktail which improved his symptoms.  ED workup has been negative for any acute intraabdominal pathology, however EKG was found to have some anterio T wave depressions which are new.  Hospitalists were contacted for admission due to concern over EKG and recent  cardiac history.    Review of Systems:  CONSTITUTIONAL: No fever/chills, fatigue, weakness, weight gain/[name], headache. EYES: No blurry or double vision. ENT: No tinnitus, postnasal drip, redness or soreness of the oropharynx. RESPIRATORY: No cough, dyspnea, wheeze, hemoptysis.  CARDIOVASCULAR: No chest pain, palpitations, syncope, orthopnea,  GASTROINTESTINAL: Positive nausea, vomiting, abdominal pain, Negative constipation, diarrhea.  No hematemesis, melena or hematochezia. GENITOURINARY: No dysuria, frequency, hematuria. ENDOCRINE: No polyuria or nocturia. No heat or cold intolerance. HEMATOLOGY: No anemia, bruising, bleeding. INTEGUMENTARY: No rashes, ulcers, lesions. MUSCULOSKELETAL: No arthritis, gout, dyspnea.  NEUROLOGIC: No numbness, tingling, ataxia, seizure-type activity, weakness. PSYCHIATRIC: No anxiety, depression, insomnia.   Past Medical History:  Diagnosis Date  . Anxiety   . Arthritis    knees and hands  . Bipolar disorder (HCC)   . Depression   . GERD (gastroesophageal reflux disease)   . Hepatitis    HEP "C"  . Hypertension   . Kidney stones   . Pericarditis 05/2015   a. echo 5/17: EF 60-65%, no RWMA, LV dias fxn nl, LA mildly dilated, RV sys fxn nl, PASP nl, moderate sized circumferential pericardial effusion was identified, 2.12 cm around the LV free wall, <1 cm around the RV free wall. Features were not c/w tamponade physiology  . PTSD (post-traumatic stress disorder)   . Restless leg syndrome   . Syncope     Past Surgical History:  Procedure Laterality Date  . CYSTOSCOPY WITH URETEROSCOPY AND STENT PLACEMENT    . JOINT REPLACEMENT    . KNEE ARTHROSCOPY Right 06/25/2014   Procedure: ARTHROSCOPY KNEE;  Surgeon: Dimas Aguas  Hyacinth MeekerMiller, MD;  Location: [facility] ORS;  Service: Orthopedics;  Laterality: Right;  partial arthroscopic medial menisectomy  . TOTAL KNEE ARTHROPLASTY Right 04/22/2015   Procedure: TOTAL KNEE ARTHROPLASTY;  Surgeon: Deeann SaintHoward Miller, MD;  Location:  [facility] ORS;  Service: Orthopedics;  Laterality: Right;     reports that he quit smoking about 31 years ago. His smoking use included Cigarettes. He smoked 1.00 pack per day. He has never used smokeless tobacco. He reports that he does not use drugs. His alcohol history is not on file.  Allergies  Allergen Reactions  . Toradol [Ketorolac Tromethamine] Hives    Family History  Problem Relation Age of Onset  . CVA Mother     deceased at age 53  . Depression Brother     Died by suicide at age 53   Family history has been reviewed and confirmed with patient.   Prior to Admission medications   Medication Sig Start Date End Date Taking? Authorizing Provider  benztropine (COGENTIN) 0.5 MG tablet Take 1 tablet (0.5 mg total) by mouth 2 (two) times daily as needed for tremors. 04/25/15  Yes Deeann SaintHoward Miller, MD  colchicine 0.6 MG tablet Take 1 tablet (0.6 mg total) by mouth daily. X 2 weeks 06/02/15  Yes Enid Baasadhika Kalisetti, MD  divalproex (DEPAKOTE) 500 MG DR tablet Take 500 mg by mouth 2 (two) times daily.   Yes Historical Provider, MD  enalapril (VASOTEC) 10 MG tablet Take 10 mg by mouth daily.   Yes Historical Provider, MD  FLUoxetine (PROZAC) 20 MG capsule Take 40 mg by mouth daily.    Yes Historical Provider, MD  gabapentin (NEURONTIN) 400 MG capsule Take 1 capsule (400 mg total) by mouth 3 (three) times daily. 04/25/15  Yes Deeann SaintHoward Miller, MD  methocarbamol (ROBAXIN) 500 MG tablet Take 1 tablet (500 mg total) by mouth every 6 (six) hours as needed for muscle spasms. 04/25/15  Yes Deeann SaintHoward Miller, MD  pantoprazole (PROTONIX) 40 MG tablet Take 1 tablet (40 mg total) by mouth 2 (two) times daily. 06/02/15  Yes Enid Baasadhika Kalisetti, MD  QUEtiapine (SEROQUEL) 50 MG tablet Take 50 mg by mouth 4 (four) times daily as needed (for agitation).    Yes Historical Provider, MD  rosuvastatin (CRESTOR) 10 MG tablet Take 1 tablet (10 mg total) by mouth daily. 06/02/15  Yes Enid Baasadhika Kalisetti, MD  SEROQUEL XR 400 MG 24 hr  tablet Take 400 mg by mouth at bedtime.    Yes Historical Provider, MD  levofloxacin (LEVAQUIN) 500 MG tablet Take 1 tablet (500 mg total) by mouth daily. X 6 more days Patient not taking: Reported on 01/06/2016 06/02/15   Enid Baasadhika Kalisetti, MD  tamsulosin (FLOMAX) 0.4 MG CAPS capsule Take 0.4 mg by mouth daily after breakfast.    Historical Provider, MD    Physical Exam: Vitals:   01/06/16 1154 01/06/16 1200 01/06/16 1430 01/06/16 1530  BP: (!) 163/101 (!) 151/109 (!) 147/99 (!) 158/104  Pulse: (!) 102 89 95 97  Resp: (!) 24 (!) 27 (!) 21 15  Temp: 98.3 F (36.8 C)  98.3 F (36.8 C)   TempSrc: Oral  Oral   SpO2: 100% 100% 100% 100%  Weight: 81.6 kg (180 lb)     Height: 6' (1.829 m)       GENERAL: 55 y.o.-year-old White male patient, well-developed, well-nourished lying in the bed in moderate distress, writhing in pain.  Pleasant and cooperative.   HEENT: Head atraumatic, normocephalic. Pupils equal, round, reactive to light and accommodation. No scleral  icterus. Extraocular muscles intact. Nares are patent. Oropharynx is clear. Mucus membranes moist. NECK: Supple, full range of motion. No JVD, no bruit heard. No thyroid enlargement, no tenderness, no cervical lymphadenopathy. CHEST: Normal breath sounds bilaterally. No wheezing, rales, rhonchi or crackles. No use of accessory muscles of respiration.  No reproducible chest wall tenderness.  CARDIOVASCULAR: S1, S2 normal. No murmurs, rubs, or gallops. Cap refill <2 seconds. Pulses intact distally.  ABDOMEN: Soft, nondistended, positive tenderness at midepigastrum . No rebound, guarding, rigidity. Normoactive bowel sounds present in all four quadrants. No organomegaly or mass. EXTREMITIES: No pedal edema, cyanosis, or clubbing. NEUROLOGIC: Cranial nerves II through XII are grossly intact with no focal sensorimotor deficit. Muscle strength 5/5 in all extremities. Sensation intact. Gait not checked. PSYCHIATRIC: The patient is alert and  oriented x 3. Normal affect, mood, thought content. SKIN: Warm, dry, and intact without obvious rash, lesion, or ulcer.   Labs on Admission: I have personally reviewed following labs and imaging studies  CBC:  Recent Labs Lab 01/06/16 1219  WBC 15.3*  NEUTROABS 12.4*  HGB 15.5  HCT 45.3  MCV 90.8  PLT 379   Basic Metabolic Panel:  Recent Labs Lab 01/06/16 1219  NA 139  K 3.8  CL 103  CO2 22  GLUCOSE 131*  BUN 20  CREATININE 1.13  CALCIUM 10.7*   GFR: Estimated Creatinine Clearance: 83 mL/min (by C-G formula based on SCr of 1.13 mg/dL). Liver Function Tests:  Recent Labs Lab 01/06/16 1219  AST 27  ALT 22  ALKPHOS 78  BILITOT 0.8  PROT 8.9*  ALBUMIN 4.8    Recent Labs Lab 01/06/16 1219  LIPASE 19   No results for input(s): AMMONIA in the last 168 hours. Coagulation Profile: No results for input(s): INR, PROTIME in the last 168 hours. Cardiac Enzymes:  Recent Labs Lab 01/06/16 1219  TROPONINI <0.03   Urine analysis:    Component Value Date/Time   COLORURINE YELLOW (A) 05/13/2015 1226   APPEARANCEUR CLEAR (A) 05/13/2015 1226   LABSPEC 1.048 (H) 05/13/2015 1226   PHURINE 6.0 05/13/2015 1226   GLUCOSEU NEGATIVE 05/13/2015 1226   HGBUR NEGATIVE 05/13/2015 1226   BILIRUBINUR NEGATIVE 05/13/2015 1226   KETONESUR TRACE (A) 05/13/2015 1226   PROTEINUR NEGATIVE 05/13/2015 1226   NITRITE NEGATIVE 05/13/2015 1226   LEUKOCYTESUR NEGATIVE 05/13/2015 1226   Sepsis Labs: @LABRCNTIP (procalcitonin:4,lacticidven:4) )No results found for this or any previous visit (from the past 240 hour(s)).   Radiological Exams on Admission: Ct Abdomen Pelvis W Contrast  Result Date: 01/06/2016 CLINICAL DATA:  Pt c/o epigastric pain and vomiting x 2 days, constipation. States he passed a right sided kidney stone last week. Dx with pericarditis last month, pain similar area. EXAM: CT ABDOMEN AND PELVIS WITH CONTRAST TECHNIQUE: Multidetector CT imaging of the abdomen and  pelvis was performed using the standard protocol following bolus administration of intravenous contrast. CONTRAST:  ISOVUE-300 IOPAMIDOL (ISOVUE-300) INJECTION 61% COMPARISON:  05/12/2015 FINDINGS: Lower chest: Clear lung bases. Heart normal size. No pericardial effusion. Hepatobiliary: 6 mm low-density lesion in the right liver lobe adjacent to the gallbladder, likely a cyst. No other liver masses or lesions. Normal gallbladder. No bile duct dilation. Pancreas: Unremarkable. No pancreatic ductal dilatation or surrounding inflammatory changes. Spleen: Normal in size without focal abnormality. Adrenals/Urinary Tract: No adrenal masses. Multiple small low-density renal lesions consistent with cysts. No stones. No hydronephrosis. 7 mm lesion from the posterior lower pole the right kidney shows relative increased attenuation and potentially is  enhancing. This was not present on an abdomen and pelvis CT from 02/01/2007. No other renal masses or lesions. Normal ureters. Unremarkable bladder. Stomach/Bowel: Stomach and small bowel are unremarkable. Mild increased stool in the colon. No colonic wall thickening or inflammation. Normal appendix visualized. Vascular/Lymphatic: No significant vascular findings are present. No enlarged abdominal or pelvic lymph nodes. Reproductive: Prostate is unremarkable. Other: Small fat containing umbilical hernia. No other hernias. No ascites. Musculoskeletal: No acute or significant osseous findings. IMPRESSION: 1. No acute findings. 2. No pericardial effusion or CT evidence of pericarditis. Pericardial fluid seen on the prior CT has resolved. 3. **An incidental finding of potential clinical significance has been found. ** 7 mm cortical mass arises from the right kidney, posterior lower pole. This could be a solid mass or potentially a cyst complicated by hemorrhage or proteinaceous content. It appears new from a CT dated 02/01/2007. Further evaluation with pre and post contrast MRI  should be considered. Pre and post contrast CT could alternatively be performed, but would likely be of decreased accuracy given lesion size. 4. Mild increased stool throughout the colon. No bowel obstruction or inflammation. Electronically Signed   By: Amie Portlandavid  Ormond M.D.   On: 01/06/2016 15:33    EKG: Normal sinus rhythm at 98 bpm with normal axis and nonspecific ST-T wave changes.   Assessment/Plan Active Problems:   Abdominal pain    This is a 55 y.o. male with a history of CAD, CHF, pericarditis (5/17),  Hep C, GERD, renal stones, OA, bipolar and PTSD now being admitted with:  Intractable abdominal pain- DDX gastroenteritis, gastritis, PUD,  - Admit to observation, telemetry - Pain control, IVFs, antiemetics, trial of carafate.   - Check HPylori   EKG changes concerning for ischemia - Aspirin now - Repeat echo given recent history of pericarditis and CHF. - Consider cardiology consult. - Continue home medications including statin.  Add beta blocker.  - Trend troponins and monitor on tele.  - Check lipids and TSH  - Incidental 7mm mass found on right kidney will need outpatient workup.   Admission status: Observation, telemetry IV Fluids: NS Diet/Nutrition: Clears Consults called: None  DVT Px: Lovenox, SCDs and early ambulation Code Status: Full Code  Disposition Plan: to home in <24 hours.    All the records are reviewed and case discussed with ED provider. Management plans discussed with the patient and/or family who express understanding and agree with plan of care.  [name] D.O. on 01/06/2016 at 7:11 PM Between 7am to 6pm - Pager - [phone] After 6pm go to [email or web address] - Location managerpassword [email or web address] [facility] [place] [facility] Office [phone] CC: [facility] physician; [facility]   01/06/2016, 7:11 PM

## 2016-01-06 NOTE — Progress Notes (Signed)
Patient complaining of not being able to void.."since yesterday".. And pressure/fullness in bladder; bladder scanned >[id number] in bladder; on-call hosptialist paged; waiting for call back. Augie Vane K, RN9:31 PM 01/06/2016.

## 2016-01-06 NOTE — ED Provider Notes (Signed)
[facility] Emergency Department Provider Note   ____________________________________________   I have reviewed the triage vital signs and the nursing notes.   HISTORY  Chief Complaint Epigastric pain  History limited by: Not Limited   HPI [name] is a 55 y.o. male who presents to the emergency department today because of concerns for epigastric pain. He states that it started 2 days ago. It has been constant. It is severe. He denies any radiation to his chest or his back. He has had multiple associated episodes of nausea and vomiting. He denies any change in defecation or bloody stool. He denies any fevers. Denies similar symptoms in the past. States that he occasionally drinks beer however denies any recent large ingestions. Denies any similar symptoms in the past.    Past Medical History:  Diagnosis Date  . Anxiety   . Arthritis    knees and hands  . Bipolar disorder (HCC)   . Depression   . GERD (gastroesophageal reflux disease)   . Hepatitis    HEP "C"  . Hypertension   . Kidney stones   . Pericarditis 05/2015   a. echo 5/17: EF 60-65%, no RWMA, LV dias fxn nl, LA mildly dilated, RV sys fxn nl, PASP nl, moderate sized circumferential pericardial effusion was identified, 2.12 cm around the LV free wall, <1 cm around the RV free wall. Features were not c/w tamponade physiology  . PTSD (post-traumatic stress disorder)   . Restless leg syndrome   . Syncope     Patient Active Problem List   Diagnosis Date Noted  . Anemia 06/02/2015  . Chest pain with moderate risk for cardiac etiology   . Coronary artery disease involving native coronary artery of native heart with angina pectoris (HCC)   . Hyperlipidemia   . Pleuritic chest pain 06/01/2015  . Pericarditis with effusion 05/13/2015  . Chest pain at rest 05/12/2015  . Dyspnea 05/12/2015  . SIRS (systemic inflammatory response syndrome) (HCC) 05/12/2015  . Total knee replacement status  04/22/2015    Past Surgical History:  Procedure Laterality Date  . CYSTOSCOPY WITH URETEROSCOPY AND STENT PLACEMENT    . JOINT REPLACEMENT    . KNEE ARTHROSCOPY Right 06/25/2014   Procedure: ARTHROSCOPY KNEE;  Surgeon: Deeann Saint, MD;  Location: [facility] ORS;  Service: Orthopedics;  Laterality: Right;  partial arthroscopic medial menisectomy  . TOTAL KNEE ARTHROPLASTY Right 04/22/2015   Procedure: TOTAL KNEE ARTHROPLASTY;  Surgeon: Deeann Saint, MD;  Location: [facility] ORS;  Service: Orthopedics;  Laterality: Right;    Prior to Admission medications   Medication Sig Start Date End Date Taking? Authorizing Provider  benztropine (COGENTIN) 0.5 MG tablet Take 1 tablet (0.5 mg total) by mouth 2 (two) times daily as needed for tremors. 04/25/15   Deeann Saint, MD  colchicine 0.6 MG tablet Take 1 tablet (0.6 mg total) by mouth daily. X 2 weeks 06/02/15   Enid Baas, MD  divalproex (DEPAKOTE) 500 MG DR tablet Take 500 mg by mouth 2 (two) times daily.    Historical Provider, MD  enalapril (VASOTEC) 10 MG tablet Take 10 mg by mouth daily.    Historical Provider, MD  FLUoxetine (PROZAC) 20 MG capsule Take 20 mg by mouth daily.    Historical Provider, MD  gabapentin (NEURONTIN) 400 MG capsule Take 1 capsule (400 mg total) by mouth 3 (three) times daily. 04/25/15   Deeann Saint, MD  levofloxacin (LEVAQUIN) 500 MG tablet Take 1 tablet (500 mg total) by mouth daily.  X 6 more days 06/02/15   Enid Baasadhika Kalisetti, MD  methocarbamol (ROBAXIN) 500 MG tablet Take 1 tablet (500 mg total) by mouth every 6 (six) hours as needed for muscle spasms. 04/25/15   Deeann SaintHoward Miller, MD  pantoprazole (PROTONIX) 40 MG tablet Take 1 tablet (40 mg total) by mouth 2 (two) times daily. 06/02/15   Enid Baasadhika Kalisetti, MD  QUEtiapine (SEROQUEL) 50 MG tablet Take 50 mg by mouth 4 (four) times daily as needed (for agitation).     Historical Provider, MD  rosuvastatin (CRESTOR) 10 MG tablet Take 1 tablet (10 mg total) by mouth daily. 06/02/15    Enid Baasadhika Kalisetti, MD  SEROQUEL XR 400 MG 24 hr tablet Take 400 mg by mouth at bedtime.     Historical Provider, MD  tamsulosin (FLOMAX) 0.4 MG CAPS capsule Take 0.4 mg by mouth daily after breakfast.    Historical Provider, MD  traMADol (ULTRAM) 50 MG tablet Take 50 mg by mouth every 6 (six) hours as needed for moderate pain.    Historical Provider, MD    Allergies Toradol [ketorolac tromethamine]  Family History  Problem Relation Age of Onset  . CVA Mother     deceased    Social History Social History  Substance Use Topics  . Smoking status: Former Smoker    Packs/day: 1.00    Types: Cigarettes    Quit date: 05/16/1984  . Smokeless tobacco: Never Used  . Alcohol use Not on file    Review of Systems  Constitutional: Negative for fever. Cardiovascular: Negative for chest pain. Respiratory: Negative for shortness of breath. Gastrointestinal: Positive for epigastric pain, nausea and vomiting. Genitourinary: Negative for dysuria. Musculoskeletal: Negative for back pain. Skin: Negative for rash. Neurological: Negative for headaches, focal weakness or numbness.  10-point ROS otherwise negative.  ____________________________________________   PHYSICAL EXAM:  VITAL SIGNS: ED Triage Vitals  Enc Vitals Group     BP 01/06/16 1154 (!) 163/101     Pulse Rate 01/06/16 1154 (!) 102     Resp 01/06/16 1154 (!) 24     Temp 01/06/16 1154 98.3 F (36.8 C)     Temp Source 01/06/16 1154 Oral     SpO2 01/06/16 1154 100 %     Weight 01/06/16 1154 180 lb (81.6 kg)     Height 01/06/16 1154 6' (1.829 m)     Head Circumference --      Peak Flow --      Pain Score 01/06/16 1155 8    Constitutional: Alert and oriented. Appears uncomfortable. Eyes: Conjunctivae are normal. Normal extraocular movements. ENT   Head: Normocephalic and atraumatic.   Nose: No congestion/rhinnorhea.   Mouth/Throat: Mucous membranes are moist.   Neck: No  stridor. Hematological/Lymphatic/Immunilogical: No cervical lymphadenopathy. Cardiovascular: Tachycardic, regular rhythm.  No murmurs, rubs, or gallops.  Respiratory: Normal respiratory effort without tachypnea nor retractions. Breath sounds are clear and equal bilaterally. No wheezes/rales/rhonchi. Gastrointestinal: Soft and tender to palpation in the epigastric region.  Genitourinary: Deferred Musculoskeletal: Normal range of motion in all extremities. No lower extremity edema. Neurologic:  Normal speech and language. No gross focal neurologic deficits are appreciated.  Skin:  Skin is warm, dry and intact. No rash noted. Psychiatric: Mood and affect are normal. Speech and behavior are normal. Patient exhibits appropriate insight and judgment.  ____________________________________________    LABS (pertinent positives/negatives)  Labs Reviewed  CBC WITH DIFFERENTIAL/PLATELET - Abnormal; Notable for the following:       Result Value   WBC 15.3 (*)  Neutro Abs 12.4 (*)    Monocytes Absolute 1.1 (*)    All other components within normal limits  COMPREHENSIVE METABOLIC PANEL - Abnormal; Notable for the following:    Glucose, Bld 131 (*)    Calcium 10.7 (*)    Total Protein 8.9 (*)    All other components within normal limits  LIPASE, BLOOD  INFLUENZA PANEL BY PCR (TYPE A & B, H1N1)   Influenza pending  ____________________________________________   EKG  I, Phineas SemenGraydon Chrissi Crow, attending physician, personally viewed and interpreted this EKG  EKG Time: 1151 Rate: 98 Rhythm: normal sinus rhythm Axis: normal Intervals: qtc 447 QRS: narrow, abnormal r wave progression ST changes: no st elevation Impression: abnormal ekg   ____________________________________________    RADIOLOGY  CT abd/pel pending   ____________________________________________   PROCEDURES  Procedures  ____________________________________________   INITIAL IMPRESSION / ASSESSMENT AND PLAN /  ED COURSE  Pertinent labs & imaging results that were available during my care of the patient were reviewed by me and considered in my medical decision making (see chart for details).  Patient presented to the emergency department today because of concerns for abdominal pain. Located in the epigastric region. Lipase and LFTs within normal limits. Blood work however was concerning for a leukocytosis. Patient's pain is severe. Will obtain a CT scan to evaluate.  ____________________________________________   FINAL CLINICAL IMPRESSION(S) / ED DIAGNOSES  Abdominal pain  Note: This dictation was prepared with Dragon dictation. Any transcriptional errors that result from this process are unintentional     Phineas SemenGraydon Mayte Diers, MD 01/06/16 1454

## 2016-01-06 NOTE — ED Provider Notes (Signed)
Study Result   CLINICAL DATA:  Pt c/o epigastric pain and vomiting x 2 days, constipation. States he passed a right sided kidney stone last week. Dx with pericarditis last month, pain similar area.  EXAM: CT ABDOMEN AND PELVIS WITH CONTRAST  TECHNIQUE: Multidetector CT imaging of the abdomen and pelvis was performed using the standard protocol following bolus administration of intravenous contrast.  CONTRAST:  100mL ISOVUE-300 IOPAMIDOL (ISOVUE-300) INJECTION 61%  COMPARISON:  05/12/2015  FINDINGS: Lower chest: Clear lung bases. Heart normal size. No pericardial effusion.  Hepatobiliary: 6 mm low-density lesion in the right liver lobe adjacent to the gallbladder, likely a cyst. No other liver masses or lesions. Normal gallbladder. No bile duct dilation.  Pancreas: Unremarkable. No pancreatic ductal dilatation or surrounding inflammatory changes.  Spleen: Normal in size without focal abnormality.  Adrenals/Urinary Tract: No adrenal masses. Multiple small low-density renal lesions consistent with cysts. No stones. No hydronephrosis. 7 mm lesion from the posterior lower pole the right kidney shows relative increased attenuation and potentially is enhancing. This was not present on an abdomen and pelvis CT from 02/01/2007. No other renal masses or lesions. Normal ureters. Unremarkable bladder.  Stomach/Bowel: Stomach and small bowel are unremarkable. Mild increased stool in the colon. No colonic wall thickening or inflammation. Normal appendix visualized.  Vascular/Lymphatic: No significant vascular findings are present. No enlarged abdominal or pelvic lymph nodes.  Reproductive: Prostate is unremarkable.  Other: Small fat containing umbilical hernia. No other hernias. No ascites.  Musculoskeletal: No acute or significant osseous findings.  IMPRESSION: 1. No acute findings. 2. No pericardial effusion or CT evidence of pericarditis. Pericardial fluid  seen on the prior CT has resolved. 3. **An incidental finding of potential clinical significance has been found. ** 7 mm cortical mass arises from the right kidney, posterior lower pole. This could be a solid mass or potentially a cyst complicated by hemorrhage or proteinaceous content. It appears new from a CT dated 02/01/2007. Further evaluation with pre and post contrast MRI should be considered. Pre and post contrast CT could alternatively be performed, but would likely be of decreased accuracy given lesion size. 4. Mild increased stool throughout the colon. No bowel obstruction or inflammation.   Electronically Signed   By: Amie Portlandavid  Ormond M.D.   On: 01/06/2016 15:33   ----------------------------------------- 3:46 PM on 01/06/2016 -----------------------------------------  Patient's currently writhing around in bed complaining exquisite pain in the upper abdomen. He says like he feels like he has to go urinate but cannot CT only shows a 7 mm mass on the kidney.   GI cocktail makes pain much better. EKG was read done and shows ST segment depression in V3 and V4 which is new from several months ago. Myoview was done in May which was read as normal report is below Study Result   Pharmacological myocardial perfusion imaging study with no significant  ischemia Normal wall motion, EF estimated at 73% No EKG changes concerning for ischemia at peak stress or in recovery. Low risk scan   Signed, Dossie Arbourim Gollan, MD, Ph.D [facility]     She reports whenever he eats anything he vomits. Not sure if he has an ulcer or EKG changes or something cardiac which is new. We will plan on observing him in the hospital for a little bit trying to figure out what is going on.   Arnaldo NatalPaul F Natalya Domzalski, MD 01/06/16 1700

## 2016-01-06 NOTE — Progress Notes (Addendum)
Dr. Anne HahnWillis notified of baldderscan results; acknowledged, stated that if he has reoccurence of >450-52200ml he will need a foley placed; new order written; I/O cathed: 600ml; urine sent for u/a; patient states that he feels much better; Windy Carinaurner,Milam Allbaugh K, RN 9:50 PM 01/06/2016

## 2016-01-06 NOTE — ED Triage Notes (Signed)
Ems pt from home , vomiting a large amoujnt of blood, cough, fever/chills x2 days , x1 month kidney stone , x1 month pericarditis.

## 2016-01-07 ENCOUNTER — Observation Stay (HOSPITAL_BASED_OUTPATIENT_CLINIC_OR_DEPARTMENT_OTHER)
Admit: 2016-01-07 | Discharge: 2016-01-07 | Disposition: A | Discharge status: Home / Self Care | Payer: Medicaid Other | Attending: Family Medicine | Admitting: Family Medicine

## 2016-01-07 DIAGNOSIS — R9431 Abnormal electrocardiogram [ECG] [EKG]: Secondary | ICD-10-CM

## 2016-01-07 LAB — COMPREHENSIVE METABOLIC PANEL
ALK PHOS: 65 U/L (ref 38–126)
ALT: 18 U/L (ref 17–63)
ANION GAP: 8 (ref 5–15)
AST: 17 U/L (ref 15–41)
Albumin: 3.9 g/dL (ref 3.5–5.0)
BUN: 17 mg/dL (ref 6–20)
CALCIUM: 9.2 mg/dL (ref 8.9–10.3)
CHLORIDE: 108 mmol/L (ref 101–111)
CO2: 22 mmol/L (ref 22–32)
CREATININE: 1.08 mg/dL (ref 0.61–1.24)
Glucose, Bld: 109 mg/dL — ABNORMAL HIGH (ref 65–99)
Potassium: 3.4 mmol/L — ABNORMAL LOW (ref 3.5–5.1)
Sodium: 138 mmol/L (ref 135–145)
Total Bilirubin: 0.8 mg/dL (ref 0.3–1.2)
Total Protein: 7 g/dL (ref 6.5–8.1)

## 2016-01-07 LAB — CBC
HCT: 39.3 % — ABNORMAL LOW (ref 40.0–52.0)
Hemoglobin: 13.6 g/dL (ref 13.0–18.0)
MCH: 31.6 pg (ref 26.0–34.0)
MCHC: 34.5 g/dL (ref 32.0–36.0)
MCV: 91.7 fL (ref 80.0–100.0)
PLATELETS: 281 10*3/uL (ref 150–440)
RBC: 4.29 MIL/uL — AB (ref 4.40–5.90)
RDW: 14 % (ref 11.5–14.5)
WBC: 13.1 10*3/uL — ABNORMAL HIGH (ref 3.8–10.6)

## 2016-01-07 LAB — ECHOCARDIOGRAM COMPLETE
Height: 72 in
WEIGHTICAEL: 2848 [oz_av]

## 2016-01-07 LAB — LIPID PANEL
Cholesterol: 174 mg/dL (ref 0–200)
HDL: 26 mg/dL — ABNORMAL LOW (ref >40)
LDL Cholesterol: 98 mg/dL (ref 0–99)
Total CHOL/HDL Ratio: 6.7 RATIO
Triglycerides: 248 mg/dL — ABNORMAL HIGH (ref <150)
VLDL: 50 mg/dL — ABNORMAL HIGH (ref 0–40)

## 2016-01-07 LAB — TROPONIN I: Troponin I: <0.03 ng/mL (ref <0.03)

## 2016-01-07 MED ORDER — OXYCODONE HCL 5 MG PO TABS
10.0000 mg | ORAL_TABLET | ORAL | Status: DC | PRN
  Administered 2016-01-10 – 2016-01-12 (×7): 10 mg via ORAL
  Filled 2016-01-07 (×7): qty 2

## 2016-01-07 MED ORDER — GI COCKTAIL ~~LOC~~
30.0000 mL | Freq: Three times a day (TID) | ORAL | Status: DC | PRN
  Administered 2016-01-07 – 2016-01-12 (×9): 30 mL via ORAL
  Filled 2016-01-07 (×14): qty 30

## 2016-01-07 NOTE — Progress Notes (Signed)
Patient taken off floor for echocardiogram. Patient is alert and oriented, no acute distress noted.

## 2016-01-07 NOTE — Progress Notes (Signed)
*  PRELIMINARY RESULTS* Echocardiogram 2D Echocardiogram has been performed.  Cristela BlueHege, Sofija Antwi 01/07/2016, 8:17 AM

## 2016-01-07 NOTE — Plan of Care (Signed)
Problem: Pain Managment: Goal: General experience of comfort will improve Outcome: Not Progressing Continues to require pain med overnight;

## 2016-01-07 NOTE — Progress Notes (Signed)
[facility] [place] [facility]   PATIENT NAME: [name] Prosise    MRN#:  [id number]  DATE OF BIRTH:  26-Mar-1960  SUBJECTIVE:  Hospital Day: 0 days [name] Pensinger is a 55 y.o. male presenting with Abdominal pain.   Overnight events: No acute overnight events Interval Events: Still some complaints of abdominal pain right flank sharp quality 8/10 intensity  REVIEW OF SYSTEMS:  CONSTITUTIONAL: No fever, fatigue or weakness.  EYES: No blurred or double vision.  EARS, NOSE, AND THROAT: No tinnitus or ear pain.  RESPIRATORY: No cough, shortness of breath, wheezing or hemoptysis.  CARDIOVASCULAR: No chest pain, orthopnea, edema.  GASTROINTESTINAL: Positive nausea, abdominal pain. Denies vomiting  GENITOURINARY: No dysuria, hematuria.  ENDOCRINE: No polyuria, nocturia,  HEMATOLOGY: No anemia, easy bruising or bleeding SKIN: No rash or lesion. MUSCULOSKELETAL: No joint pain or arthritis.   NEUROLOGIC: No tingling, numbness, weakness.  PSYCHIATRY: No anxiety or depression.   DRUG ALLERGIES:   Allergies  Allergen Reactions  . Toradol [Ketorolac Tromethamine] Hives    VITALS:  Blood pressure (!) 149/91, pulse 78, temperature 97.7 F (36.5 C), temperature source Oral, resp. rate 18, height 6' (1.829 m), weight 80.7 kg (178 lb), SpO2 99 %.  PHYSICAL EXAMINATION:  VITAL SIGNS: Vitals:   01/07/16 0456 01/07/16 1015  BP: 134/87 (!) 149/91  Pulse: 76 78  Resp: 18 18  Temp: 97.7 F (36.5 C) 97.7 F (36.5 C)   GENERAL:55 y.o.male currently in no acute distress.  HEAD: Normocephalic, atraumatic.  EYES: Pupils equal, round, reactive to light. Extraocular muscles intact. No scleral icterus.  MOUTH: Moist mucosal membrane. Dentition intact. No abscess noted.  EAR, NOSE, THROAT: Clear without exudates. No external lesions.  NECK: Supple. No thyromegaly. No nodules. No JVD.  PULMONARY: Clear to ascultation, without wheeze rails or rhonci. No use of accessory  muscles, Good respiratory effort. good air entry bilaterally CHEST: Nontender to palpation.  CARDIOVASCULAR: S1 and S2. Regular rate and rhythm. No murmurs, rubs, or gallops. No edema. Pedal pulses 2+ bilaterally.  GASTROINTESTINAL: Soft, nontender, nondistended. No masses. Positive bowel sounds. No hepatosplenomegaly.  MUSCULOSKELETAL: No swelling, clubbing, or edema. Range of motion full in all extremities.  NEUROLOGIC: Cranial nerves II through XII are intact. No gross focal neurological deficits. Sensation intact. Reflexes intact.  SKIN: No ulceration, lesions, rashes, or cyanosis. Skin warm and dry. Turgor intact.  PSYCHIATRIC: Mood, affect within normal limits. The patient is awake, alert and oriented x 3. Insight, judgment intact.      LABORATORY PANEL:   CBC  Recent Labs Lab 01/07/16 0152  WBC 13.1*  HGB 13.6  HCT 39.3*  PLT 281   ------------------------------------------------------------------------------------------------------------------  Chemistries   Recent Labs Lab 01/07/16 0152  NA 138  K 3.4*  CL 108  CO2 22  GLUCOSE 109*  BUN 17  CREATININE 1.08  CALCIUM 9.2  AST 17  ALT 18  ALKPHOS 65  BILITOT 0.8   ------------------------------------------------------------------------------------------------------------------  Cardiac Enzymes  Recent Labs Lab 01/07/16 0830  TROPONINI <0.03   ------------------------------------------------------------------------------------------------------------------  RADIOLOGY:  Ct Abdomen Pelvis W Contrast  Result Date: 01/06/2016 CLINICAL DATA:  Pt c/o epigastric pain and vomiting x 2 days, constipation. States he passed a right sided kidney stone last week. Dx with pericarditis last month, pain similar area. EXAM: CT ABDOMEN AND PELVIS WITH CONTRAST TECHNIQUE: Multidetector CT imaging of the abdomen and pelvis was performed using the standard protocol following bolus administration of intravenous contrast.  CONTRAST:  100mL ISOVUE-300 IOPAMIDOL (ISOVUE-300)  INJECTION 61% COMPARISON:  05/12/2015 FINDINGS: Lower chest: Clear lung bases. Heart normal size. No pericardial effusion. Hepatobiliary: 6 mm low-density lesion in the right liver lobe adjacent to the gallbladder, likely a cyst. No other liver masses or lesions. Normal gallbladder. No bile duct dilation. Pancreas: Unremarkable. No pancreatic ductal dilatation or surrounding inflammatory changes. Spleen: Normal in size without focal abnormality. Adrenals/Urinary Tract: No adrenal masses. Multiple small low-density renal lesions consistent with cysts. No stones. No hydronephrosis. 7 mm lesion from the posterior lower pole the right kidney shows relative increased attenuation and potentially is enhancing. This was not present on an abdomen and pelvis CT from 02/01/2007. No other renal masses or lesions. Normal ureters. Unremarkable bladder. Stomach/Bowel: Stomach and small bowel are unremarkable. Mild increased stool in the colon. No colonic wall thickening or inflammation. Normal appendix visualized. Vascular/Lymphatic: No significant vascular findings are present. No enlarged abdominal or pelvic lymph nodes. Reproductive: Prostate is unremarkable. Other: Small fat containing umbilical hernia. No other hernias. No ascites. Musculoskeletal: No acute or significant osseous findings. IMPRESSION: 1. No acute findings. 2. No pericardial effusion or CT evidence of pericarditis. Pericardial fluid seen on the prior CT has resolved. 3. **An incidental finding of potential clinical significance has been found. ** 7 mm cortical mass arises from the right kidney, posterior lower pole. This could be a solid mass or potentially a cyst complicated by hemorrhage or proteinaceous content. It appears new from a CT dated 02/01/2007. Further evaluation with pre and post contrast MRI should be considered. Pre and post contrast CT could alternatively be performed, but would likely be of  decreased accuracy given lesion size. 4. Mild increased stool throughout the colon. No bowel obstruction or inflammation. Electronically Signed   By: Amie Portlandavid  Ormond M.D.   On: 01/06/2016 15:33    EKG:   Orders placed or performed during the hospital encounter of 01/06/16  . ED EKG  . ED EKG  . EKG 12-Lead  . EKG 12-Lead  . EKG 12-Lead    ASSESSMENT AND PLAN:   [name] is a 55 y.o. male presenting with Abdominal pain . Admitted 01/06/2016 : Day #: 0 days 1. Intractable abdominal pain in setting of renal cyst rupture: Pain medications, minimize IV can increase oral, and GI cocktail 2. History of pericarditis: Stable   All the records are reviewed and case discussed with Care Management/Social Workerr. Management plans discussed with the patient, family and they are in agreement.  CODE STATUS: full TOTAL TIME TAKING CARE OF THIS PATIENT: 28 minutes.   POSSIBLE D/C IN 1DAYS, DEPENDING ON CLINICAL CONDITION.   Hower,  Mardi MainlandDavid K M.D on 01/07/2016 at 12:49 PM  Between 7am to 6pm - Pager - [phone]  After 6pm: House Pager: - [phone]  [facility]  Office  [phone]  CC: Primary care physician; [facility]

## 2016-01-07 NOTE — Plan of Care (Signed)
Problem: Bowel/Gastric: Goal: Will not experience complications related to bowel motility Outcome: Progressing Nausea improved; no vomiting.

## 2016-01-08 MED ORDER — PANTOPRAZOLE SODIUM 40 MG IV SOLR
40.0000 mg | Freq: Once | INTRAVENOUS | Status: AC
  Administered 2016-01-08: 40 mg via INTRAVENOUS
  Filled 2016-01-08: qty 40

## 2016-01-08 NOTE — Plan of Care (Signed)
Problem: Pain Managment: Goal: General experience of comfort will improve Outcome: Adequate for Discharge GI cocktail effective; No PRNs given overnight.

## 2016-01-08 NOTE — Progress Notes (Signed)
I called Dr. Anne HahnWillis regarding patient request for applesauce.  Doctor said that it was okay as long as patient tolerates it.  Arturo MortonClay, Myrta Mercer N  01/08/2016  9:07 PM

## 2016-01-08 NOTE — Progress Notes (Addendum)
[facility] [place] [facility]   PATIENT NAME: [name]    MRN#:  [id number]  DATE OF BIRTH:  1960-07-21  SUBJECTIVE:  Hospital Day: 0 days [name] is a 55 y.o. male presenting with Abdominal pain.   Overnight events: No acute overnight events Interval Events: Did well overnight not needing any pain medication however this morning after advancing his diet he complained of pain with swallowing and epigastric tenderness  REVIEW OF SYSTEMS:  CONSTITUTIONAL: No fever, fatigue or weakness.  EYES: No blurred or double vision.  EARS, NOSE, AND THROAT: No tinnitus or ear pain.  RESPIRATORY: No cough, shortness of breath, wheezing or hemoptysis.  CARDIOVASCULAR: No chest pain, orthopnea, edema.  GASTROINTESTINAL: Denies nausea, abdominal pain. Denies vomiting  GENITOURINARY: No dysuria, hematuria.  ENDOCRINE: No polyuria, nocturia,  HEMATOLOGY: No anemia, easy bruising or bleeding SKIN: No rash or lesion. MUSCULOSKELETAL: No joint pain or arthritis.   NEUROLOGIC: No tingling, numbness, weakness.  PSYCHIATRY: No anxiety or depression.   DRUG ALLERGIES:   Allergies  Allergen Reactions  . Toradol [Ketorolac Tromethamine] Hives    VITALS:  Blood pressure 98/67, pulse 72, temperature 97.9 F (36.6 C), temperature source Oral, resp. rate 16, height 6' (1.829 m), weight 80.7 kg (178 lb), SpO2 97 %.  PHYSICAL EXAMINATION:  VITAL SIGNS: Vitals:   01/08/16 1135 01/08/16 1327  BP: 108/73 98/67  Pulse: 79 72  Resp: 18 16  Temp: 97.8 F (36.6 C) 97.9 F (36.6 C)   GENERAL:55 y.o.male currently in no acute distress.  HEAD: Normocephalic, atraumatic.  EYES: Pupils equal, round, reactive to light. Extraocular muscles intact. No scleral icterus.  MOUTH: Moist mucosal membrane. Dentition intact. No abscess noted.  EAR, NOSE, THROAT: Clear without exudates. No external lesions.  NECK: Supple. No thyromegaly. No nodules. No JVD.  PULMONARY: Clear to  ascultation, without wheeze rails or rhonci. No use of accessory muscles, Good respiratory effort. good air entry bilaterally CHEST: Nontender to palpation.  CARDIOVASCULAR: S1 and S2. Regular rate and rhythm. No murmurs, rubs, or gallops. No edema. Pedal pulses 2+ bilaterally.  GASTROINTESTINAL: Soft, nontender, nondistended. No masses. Positive bowel sounds. No hepatosplenomegaly.  MUSCULOSKELETAL: No swelling, clubbing, or edema. Range of motion full in all extremities.  NEUROLOGIC: Cranial nerves II through XII are intact. No gross focal neurological deficits. Sensation intact. Reflexes intact.  SKIN: No ulceration, lesions, rashes, or cyanosis. Skin warm and dry. Turgor intact.  PSYCHIATRIC: Mood, affect within normal limits. The patient is awake, alert and oriented x 3. Insight, judgment intact.      LABORATORY PANEL:   CBC  Recent Labs Lab 01/07/16 0152  WBC 13.1*  HGB 13.6  HCT 39.3*  PLT 281   ------------------------------------------------------------------------------------------------------------------  Chemistries   Recent Labs Lab 01/07/16 0152  NA 138  K 3.4*  CL 108  CO2 22  GLUCOSE 109*  BUN 17  CREATININE 1.08  CALCIUM 9.2  AST 17  ALT 18  ALKPHOS 65  BILITOT 0.8   ------------------------------------------------------------------------------------------------------------------  Cardiac Enzymes  Recent Labs Lab 01/07/16 0830  TROPONINI <0.03   ------------------------------------------------------------------------------------------------------------------  RADIOLOGY:  Ct Abdomen Pelvis W Contrast  Result Date: 01/06/2016 CLINICAL DATA:  Pt c/o epigastric pain and vomiting x 2 days, constipation. States he passed a right sided kidney stone last week. Dx with pericarditis last month, pain similar area. EXAM: CT ABDOMEN AND PELVIS WITH CONTRAST TECHNIQUE: Multidetector CT imaging of the abdomen and pelvis was performed using the standard  protocol following bolus  administration of intravenous contrast. CONTRAST:  100mL ISOVUE-300 IOPAMIDOL (ISOVUE-300) INJECTION 61% COMPARISON:  05/12/2015 FINDINGS: Lower chest: Clear lung bases. Heart normal size. No pericardial effusion. Hepatobiliary: 6 mm low-density lesion in the right liver lobe adjacent to the gallbladder, likely a cyst. No other liver masses or lesions. Normal gallbladder. No bile duct dilation. Pancreas: Unremarkable. No pancreatic ductal dilatation or surrounding inflammatory changes. Spleen: Normal in size without focal abnormality. Adrenals/Urinary Tract: No adrenal masses. Multiple small low-density renal lesions consistent with cysts. No stones. No hydronephrosis. 7 mm lesion from the posterior lower pole the right kidney shows relative increased attenuation and potentially is enhancing. This was not present on an abdomen and pelvis CT from 02/01/2007. No other renal masses or lesions. Normal ureters. Unremarkable bladder. Stomach/Bowel: Stomach and small bowel are unremarkable. Mild increased stool in the colon. No colonic wall thickening or inflammation. Normal appendix visualized. Vascular/Lymphatic: No significant vascular findings are present. No enlarged abdominal or pelvic lymph nodes. Reproductive: Prostate is unremarkable. Other: Small fat containing umbilical hernia. No other hernias. No ascites. Musculoskeletal: No acute or significant osseous findings. IMPRESSION: 1. No acute findings. 2. No pericardial effusion or CT evidence of pericarditis. Pericardial fluid seen on the prior CT has resolved. 3. **An incidental finding of potential clinical significance has been found. ** 7 mm cortical mass arises from the right kidney, posterior lower pole. This could be a solid mass or potentially a cyst complicated by hemorrhage or proteinaceous content. It appears new from a CT dated 02/01/2007. Further evaluation with pre and post contrast MRI should be considered. Pre and post  contrast CT could alternatively be performed, but would likely be of decreased accuracy given lesion size. 4. Mild increased stool throughout the colon. No bowel obstruction or inflammation. Electronically Signed   By: Amie Portlandavid  Ormond M.D.   On: 01/06/2016 15:33    EKG:   Orders placed or performed during the hospital encounter of 01/06/16  . ED EKG  . ED EKG  . EKG 12-Lead  . EKG 12-Lead  . EKG 12-Lead    ASSESSMENT AND PLAN:   [name] is a 55 y.o. male presenting with Abdominal pain . Admitted 01/06/2016 : Day #: 0 days 1. Intractable abdominal pain in setting of renal cyst rupture: Pain medications, minimize IV can increase oral, and GI cocktail -Question this is more gastritis versus Mallory-Weiss given odynophagia continue with Carafate, PPI therapy, GI cocktail, GI consult-diet as tolerated 2. History of pericarditis: Stable   Given ongoing symptoms unable to tolerate regular diet hold discharge for today  All the records are reviewed and case discussed with Care Management/Social Workerr. Management plans discussed with the patient, family and they are in agreement.  CODE STATUS: full TOTAL TIME TAKING CARE OF THIS PATIENT: 33 minutes.   POSSIBLE D/C IN 1DAYS, DEPENDING ON CLINICAL CONDITION.   Hower,  Mardi MainlandDavid K M.D on 01/08/2016 at 2:33 PM  Between 7am to 6pm - Pager - [phone]  After 6pm: House Pager: [phone]  [facility]  Office  [phone]  CC: Primary care physician; [facility]

## 2016-01-09 LAB — CBC
HEMATOCRIT: 38.6 % — AB (ref 40.0–52.0)
HEMOGLOBIN: 13.3 g/dL (ref 13.0–18.0)
MCH: 31.6 pg (ref 26.0–34.0)
MCHC: 34.5 g/dL (ref 32.0–36.0)
MCV: 91.7 fL (ref 80.0–100.0)
Platelets: 203 10*3/uL (ref 150–440)
RBC: 4.21 MIL/uL — AB (ref 4.40–5.90)
RDW: 13.9 % (ref 11.5–14.5)
WBC: 6.9 10*3/uL (ref 3.8–10.6)

## 2016-01-09 LAB — COMPREHENSIVE METABOLIC PANEL
ALT: 13 U/L — ABNORMAL LOW (ref 17–63)
AST: 11 U/L — ABNORMAL LOW (ref 15–41)
Albumin: 3.3 g/dL — ABNORMAL LOW (ref 3.5–5.0)
Alkaline Phosphatase: 53 U/L (ref 38–126)
Anion gap: 6 (ref 5–15)
BUN: 15 mg/dL (ref 6–20)
CO2: 23 mmol/L (ref 22–32)
Calcium: 9.1 mg/dL (ref 8.9–10.3)
Chloride: 111 mmol/L (ref 101–111)
Creatinine, Ser: 0.94 mg/dL (ref 0.61–1.24)
GFR calc Af Amer: >60 mL/min (ref >60)
GFR calc non Af Amer: >60 mL/min (ref >60)
Glucose, Bld: 93 mg/dL (ref 65–99)
Potassium: 3.5 mmol/L (ref 3.5–5.1)
Sodium: 140 mmol/L (ref 135–145)
Total Bilirubin: 0.7 mg/dL (ref 0.3–1.2)
Total Protein: 6.3 g/dL — ABNORMAL LOW (ref 6.5–8.1)

## 2016-01-09 MED ORDER — NYSTATIN 100000 UNIT/ML MT SUSP
5.0000 mL | Freq: Four times a day (QID) | OROMUCOSAL | Status: DC
  Administered 2016-01-09 – 2016-01-12 (×12): 500000 [IU] via ORAL
  Filled 2016-01-09 (×12): qty 5

## 2016-01-09 NOTE — Consult Note (Signed)
Referring Provider: Dr. Clint Guy Primary Care Physician:  [facility] Primary Gastroenterologist:  Gentry Fitz  Reason for Consultation:  Odynophagia, Nausea/Vomiting  HPI: [name] is a 55 y.o. male with 2 days of N/V without abdominal pain. Having severe throat, chest pain, and epigastric pain with each swallow of food or liquid including swallowing water. Has chronic heartburn that is usually controlled with every other day Protonix. Denies melena or hematochezia. Reports being on antibiotics for 2 days about 3 weeks ago for a tooth infection. Chronic Hep C. Denies alcohol abuse. Takes daily Aspirin 325 mg and denies other NSAIDs although reports that he used to take BCs/Goody's powders in the past. Hgb 13.3.  Past Medical History:  Diagnosis Date  . Anxiety   . Arthritis    knees and hands  . Bipolar disorder (HCC)   . Depression   . GERD (gastroesophageal reflux disease)   . Hepatitis    HEP "C"  . Hypertension   . Kidney stones   . Pericarditis 05/2015   a. echo 5/17: EF 60-65%, no RWMA, LV dias fxn nl, LA mildly dilated, RV sys fxn nl, PASP nl, moderate sized circumferential pericardial effusion was identified, 2.12 cm around the LV free wall, <1 cm around the RV free wall. Features were not c/w tamponade physiology  . PTSD (post-traumatic stress disorder)    Witnessed brother's suicide.  Marland Kitchen Restless leg syndrome   . Syncope     Past Surgical History:  Procedure Laterality Date  . CYSTOSCOPY WITH URETEROSCOPY AND STENT PLACEMENT    . JOINT REPLACEMENT    . KNEE ARTHROSCOPY Right 06/25/2014   Procedure: ARTHROSCOPY KNEE;  Surgeon: Deeann Saint, MD;  Location: [facility] ORS;  Service: Orthopedics;  Laterality: Right;  partial arthroscopic medial menisectomy  . TOTAL KNEE ARTHROPLASTY Right 04/22/2015   Procedure: TOTAL KNEE ARTHROPLASTY;  Surgeon: Deeann Saint, MD;  Location: [facility] ORS;  Service: Orthopedics;  Laterality: Right;    Prior to Admission medications    Medication Sig Start Date End Date Taking? Authorizing Provider  benztropine (COGENTIN) 0.5 MG tablet Take 1 tablet (0.5 mg total) by mouth 2 (two) times daily as needed for tremors. 04/25/15  Yes Deeann Saint, MD  divalproex (DEPAKOTE) 500 MG DR tablet Take 500 mg by mouth 2 (two) times daily.   Yes Historical Provider, MD  enalapril (VASOTEC) 10 MG tablet Take 10 mg by mouth daily.   Yes Historical Provider, MD  FLUoxetine (PROZAC) 20 MG capsule Take 40 mg by mouth daily.    Yes Historical Provider, MD  gabapentin (NEURONTIN) 400 MG capsule Take 1 capsule (400 mg total) by mouth 3 (three) times daily. 04/25/15  Yes Deeann Saint, MD  methocarbamol (ROBAXIN) 500 MG tablet Take 1 tablet (500 mg total) by mouth every 6 (six) hours as needed for muscle spasms. 04/25/15  Yes Deeann Saint, MD  pantoprazole (PROTONIX) 40 MG tablet Take 1 tablet (40 mg total) by mouth 2 (two) times daily. 06/02/15  Yes Enid Baas, MD  QUEtiapine (SEROQUEL) 50 MG tablet Take 50 mg by mouth 4 (four) times daily as needed (for agitation).    Yes Historical Provider, MD  rosuvastatin (CRESTOR) 10 MG tablet Take 1 tablet (10 mg total) by mouth daily. 06/02/15  Yes Enid Baas, MD  SEROQUEL XR 400 MG 24 hr tablet Take 400 mg by mouth at bedtime.    Yes Historical Provider, MD  levofloxacin (LEVAQUIN) 500 MG tablet Take 1 tablet (500 mg total) by mouth daily.  X 6 more days Patient not taking: Reported on 01/06/2016 06/02/15   Enid Baasadhika Kalisetti, MD  tamsulosin (FLOMAX) 0.4 MG CAPS capsule Take 0.4 mg by mouth daily after breakfast.    Historical Provider, MD    Scheduled Meds: . divalproex  500 mg Oral BID  . enalapril  10 mg Oral Daily  . enoxaparin (LOVENOX) injection  40 mg Subcutaneous Q24H  . FLUoxetine  40 mg Oral Daily  . gabapentin  400 mg Oral TID  . metoprolol tartrate  25 mg Oral BID  . pantoprazole  40 mg Oral BID  . QUEtiapine  400 mg Oral QHS  . rosuvastatin  10 mg Oral Daily  . sodium chloride  flush  3 mL Intravenous Q12H  . sucralfate  1 g Oral TID WC & HS  . tamsulosin  0.4 mg Oral QPC breakfast   Continuous Infusions: PRN Meds:.acetaminophen **OR** acetaminophen, albuterol, benztropine, bisacodyl, gi cocktail, magnesium citrate, methocarbamol, ondansetron **OR** ondansetron (ZOFRAN) IV, oxyCODONE, QUEtiapine, senna-docusate, zolpidem  Allergies as of 01/06/2016 - Review Complete 01/06/2016  Allergen Reaction Noted  . Toradol [ketorolac tromethamine] Hives 06/25/2014    Family History  Problem Relation Age of Onset  . CVA Mother     deceased at age 53  . Depression Brother     Died by suicide at age 53    Social History   Social History  . Marital status: Single    Spouse name: N/A  . Number of children: N/A  . Years of education: N/A   Occupational History  . Not on file.   Social History Main Topics  . Smoking status: Former Smoker    Packs/day: 1.00    Types: Cigarettes    Quit date: 05/16/1984  . Smokeless tobacco: Never Used  . Alcohol use Not on file  . Drug use: No  . Sexual activity: Not on file   Other Topics Concern  . Not on file   Social History Narrative  . No narrative on file    Review of Systems: All negative except as stated above in HPI.  Physical Exam: Vital signs: Vitals:   01/08/16 2101 01/09/16 0501  BP: 104/61 109/65  Pulse: 67 71  Resp: 16 16  Temp: 97.8 F (36.6 C) 98 F (36.7 C)   Last BM Date: 01/08/16 General:  Lethargic,Well-developed, well-nourished, pleasant and cooperative in NAD Head: atraumatic Eyes: anicteric sclera, pupils equal and reactive ENT: poor dentition, oropharynx clear Neck: supple, nontender Lungs:  Clear throughout to auscultation.   No wheezes, crackles, or rhonchi. No acute distress. Heart:  Regular rate and rhythm; no murmurs, clicks, rubs,  or gallops. Abdomen: soft, nontender, nondistended, +BS  Rectal:  Deferred Ext: no edema Skin: multiple tattoos  GI:  Lab Results:  Recent  Labs  01/06/16 1219 01/07/16 0152 01/09/16 0539  WBC 15.3* 13.1* 6.9  HGB 15.5 13.6 13.3  HCT 45.3 39.3* 38.6*  PLT 379 281 203   BMET  Recent Labs  01/06/16 1219 01/07/16 0152 01/09/16 0539  NA 139 138 140  K 3.8 3.4* 3.5  CL 103 108 111  CO2 22 22 23   GLUCOSE 131* 109* 93  BUN 20 17 15   CREATININE 1.13 1.08 0.94  CALCIUM 10.7* 9.2 9.1   LFT  Recent Labs  01/09/16 0539  PROT 6.3*  ALBUMIN 3.3*  AST 11*  ALT 13*  ALKPHOS 53  BILITOT 0.7   PT/INR No results for input(s): LABPROT, INR in the last 72 hours.  Studies/Results: No results found.  Impression/Plan: 55 yo with odynophagia and N/V concerning for esophagitis such as Candida. Peptic ulcer disease with gastric outlet obstruction possible. No GI bleeding. He feels somewhat better with the Carafate and PPI. Will add Nystatin empirically for possible Candida esophagitis. Keep on clears. Advance tomorrow if tolerating clears. If unable to advance diet, then may need an EGD but will hold off for now and manage with supportive care.    LOS: 0 days   Lindwood Mogel C.  01/09/2016, 10:10 AM

## 2016-01-09 NOTE — Progress Notes (Signed)
[facility] [place] [facility]   PATIENT NAME: [name]    MRN#:  [id number]  DATE OF BIRTH:  [birth year]  SUBJECTIVE:  Hospital Day: 0 days [name] is a 55 y.o. male presenting with Abdominal pain.   Overnight events: No acute overnight events Interval Events: Unable to tolerate diet, still complains of pain with swallowing REVIEW OF SYSTEMS:  CONSTITUTIONAL: No fever, fatigue or weakness.  EYES: No blurred or double vision.  EARS, NOSE, AND THROAT: No tinnitus or ear pain. Positive odynophagia RESPIRATORY: No cough, shortness of breath, wheezing or hemoptysis.  CARDIOVASCULAR: No chest pain, orthopnea, edema.  GASTROINTESTINAL: Denies nausea, abdominal pain. Denies vomiting  GENITOURINARY: No dysuria, hematuria.  ENDOCRINE: No polyuria, nocturia,  HEMATOLOGY: No anemia, easy bruising or bleeding SKIN: No rash or lesion. MUSCULOSKELETAL: No joint pain or arthritis.   NEUROLOGIC: No tingling, numbness, weakness.  PSYCHIATRY: No anxiety or depression.   DRUG ALLERGIES:   Allergies  Allergen Reactions  . Toradol [Ketorolac Tromethamine] Hives    VITALS:  Blood pressure 108/66, pulse 70, temperature 98.1 F (36.7 C), temperature source Oral, resp. rate 16, height 6' (1.829 m), weight 80.7 kg (178 lb), SpO2 97 %.  PHYSICAL EXAMINATION:  VITAL SIGNS: Vitals:   01/09/16 0501 01/09/16 1247  BP: 109/65 108/66  Pulse: 71 70  Resp: 16 16  Temp: 98 F (36.7 C) 98.1 F (36.7 C)   GENERAL:55 y.o.male currently in no acute distress.  HEAD: Normocephalic, atraumatic.  EYES: Pupils equal, round, reactive to light. Extraocular muscles intact. No scleral icterus.  MOUTH: Moist mucosal membrane. Dentition intact. No abscess noted.  EAR, NOSE, THROAT: Clear without exudates. No external lesions.  NECK: Supple. No thyromegaly. No nodules. No JVD.  PULMONARY: Clear to ascultation, without wheeze rails or rhonci. No use of accessory muscles, Good  respiratory effort. good air entry bilaterally CHEST: Nontender to palpation.  CARDIOVASCULAR: S1 and S2. Regular rate and rhythm. No murmurs, rubs, or gallops. No edema. Pedal pulses 2+ bilaterally.  GASTROINTESTINAL: Soft, nontender, nondistended. No masses. Positive bowel sounds. No hepatosplenomegaly.  MUSCULOSKELETAL: No swelling, clubbing, or edema. Range of motion full in all extremities.  NEUROLOGIC: Cranial nerves II through XII are intact. No gross focal neurological deficits. Sensation intact. Reflexes intact.  SKIN: No ulceration, lesions, rashes, or cyanosis. Skin warm and dry. Turgor intact.  PSYCHIATRIC: Mood, affect within normal limits. The patient is awake, alert and oriented x 3. Insight, judgment intact.      LABORATORY PANEL:   CBC  Recent Labs Lab 01/09/16 0539  WBC 6.9  HGB 13.3  HCT 38.6*  PLT 203   ------------------------------------------------------------------------------------------------------------------  Chemistries   Recent Labs Lab 01/09/16 0539  NA 140  K 3.5  CL 111  CO2 23  GLUCOSE 93  BUN 15  CREATININE 0.94  CALCIUM 9.1  AST 11*  ALT 13*  ALKPHOS 53  BILITOT 0.7   ------------------------------------------------------------------------------------------------------------------  Cardiac Enzymes  Recent Labs Lab 01/07/16 0830  TROPONINI <0.03   ------------------------------------------------------------------------------------------------------------------  RADIOLOGY:  No results found.  EKG:   Orders placed or performed during the hospital encounter of 01/06/16  . ED EKG  . ED EKG  . EKG 12-Lead  . EKG 12-Lead  . EKG 12-Lead    ASSESSMENT AND PLAN:   [name] is a 55 y.o. male presenting with Abdominal pain . Admitted 01/06/2016 : Day #: 0 days 1. Intractable abdominal pain in setting of renal cyst rupture:Resolved 2 odynophagia GI cocktail, case  discussed with gastroenterology continue Carafate PPI  therapy GI cocktail concern for fungal esophagitis-start nystatin 3. History of pericarditis: Stable     All the records are reviewed and case discussed with Care Management/Social Workerr. Management plans discussed with the patient, family and they are in agreement.  CODE STATUS: full TOTAL TIME TAKING CARE OF THIS PATIENT: 28 minutes.   POSSIBLE D/C IN 1DAYS, DEPENDING ON CLINICAL CONDITION.   Hower,  Mardi MainlandDavid K M.D on 01/09/2016 at 3:14 PM  Between 7am to 6pm - Pager - [phone]  After 6pm: House Pager: - [phone]  [facility]  Office  [phone]  CC: Primary care physician; [facility]

## 2016-01-09 NOTE — Plan of Care (Signed)
Problem: Pain Managment: Goal: General experience of comfort will improve Outcome: Not Progressing Pt continues to have pain any time he swallows anything.  Pt only gets relief when he takes PRN G.I cocktail.  This has been the case since yesterday.  Bradly Chrisougherty, Markisha Meding E, RN  Problem: Nutrition: Goal: Adequate nutrition will be maintained Outcome: Not Progressing Pt hesitant to eat due to pain upon swallowing.  Bradly Chrisougherty, Karlita Lichtman E, RN

## 2016-01-10 NOTE — Progress Notes (Signed)
[facility] [place] at [facility]   PATIENT NAME: [name] Benally    MRN#:  [id number]  DATE OF BIRTH:  17-Nov-1960  SUBJECTIVE:  Hospital Day: 0 days [name] Dahlem is a 55 y.o. male presenting with Abdominal pain.   Overnight events: No acute overnight events Interval Events: Unable to tolerate diet, still complains of pain with swallowing REVIEW OF SYSTEMS:  CONSTITUTIONAL: No fever, fatigue or weakness.  EYES: No blurred or double vision.  EARS, NOSE, AND THROAT: No tinnitus or ear pain. Positive odynophagia RESPIRATORY: No cough, shortness of breath, wheezing or hemoptysis.  CARDIOVASCULAR: No chest pain, orthopnea, edema.  GASTROINTESTINAL: Denies nausea, abdominal pain. Denies vomiting  GENITOURINARY: No dysuria, hematuria.  ENDOCRINE: No polyuria, nocturia,  HEMATOLOGY: No anemia, easy bruising or bleeding SKIN: No rash or lesion. MUSCULOSKELETAL: No joint pain or arthritis.   NEUROLOGIC: No tingling, numbness, weakness.  PSYCHIATRY: No anxiety or depression.   DRUG ALLERGIES:   Allergies  Allergen Reactions  . Toradol [Ketorolac Tromethamine] Hives    VITALS:  Blood pressure 118/77, pulse 83, temperature 98 F (36.7 C), temperature source Oral, resp. rate 18, height 6' (1.829 m), weight 80.7 kg (178 lb), SpO2 95 %.  PHYSICAL EXAMINATION:  VITAL SIGNS: Vitals:   01/10/16 0532 01/10/16 0937  BP: 113/70 118/77  Pulse: 67 83  Resp: 16 18  Temp: 97.8 F (36.6 C) 98 F (36.7 C)   GENERAL:55 y.o.male currently in no acute distress.  HEAD: Normocephalic, atraumatic.  EYES: Pupils equal, round, reactive to light. Extraocular muscles intact. No scleral icterus.  MOUTH: Moist mucosal membrane. Dentition intact. No abscess noted.  EAR, NOSE, THROAT: Clear without exudates. No external lesions.  NECK: Supple. No thyromegaly. No nodules. No JVD.  PULMONARY: Clear to ascultation, without wheeze rails or rhonci. No use of accessory muscles, Good  respiratory effort. good air entry bilaterally CHEST: Nontender to palpation.  CARDIOVASCULAR: S1 and S2. Regular rate and rhythm. No murmurs, rubs, or gallops. No edema. Pedal pulses 2+ bilaterally.  GASTROINTESTINAL: Soft, nontender, nondistended. No masses. Positive bowel sounds. No hepatosplenomegaly.  MUSCULOSKELETAL: No swelling, clubbing, or edema. Range of motion full in all extremities.  NEUROLOGIC: Cranial nerves II through XII are intact. No gross focal neurological deficits. Sensation intact. Reflexes intact.  SKIN: No ulceration, lesions, rashes, or cyanosis. Skin warm and dry. Turgor intact.  PSYCHIATRIC: Mood, affect within normal limits. The patient is awake, alert and oriented x 3. Insight, judgment intact.      LABORATORY PANEL:   CBC  Recent Labs Lab 01/09/16 0539  WBC 6.9  HGB 13.3  HCT 38.6*  PLT 203   ------------------------------------------------------------------------------------------------------------------  Chemistries   Recent Labs Lab 01/09/16 0539  NA 140  K 3.5  CL 111  CO2 23  GLUCOSE 93  BUN 15  CREATININE 0.94  CALCIUM 9.1  AST 11*  ALT 13*  ALKPHOS 53  BILITOT 0.7   ------------------------------------------------------------------------------------------------------------------  Cardiac Enzymes  Recent Labs Lab 01/07/16 0830  TROPONINI <0.03   ------------------------------------------------------------------------------------------------------------------  RADIOLOGY:  No results found.  EKG:   Orders placed or performed during the hospital encounter of 01/06/16  . ED EKG  . ED EKG  . EKG 12-Lead  . EKG 12-Lead  . EKG 12-Lead    ASSESSMENT AND PLAN:   [name] Mcintyre is a 55 y.o. male presenting with Abdominal pain . Admitted 01/06/2016 : Day #: 0 days 1. Intractable abdominal pain in setting of renal cyst rupture:Resolved 2 odynophagia GI cocktail, case  discussed with gastroenterology continue Carafate PPI  therapy GI cocktail concern for fungal esophagitis-started nystatinWith no improvement 3. History of pericarditis: Stable     All the records are reviewed and case discussed with Care Management/Social Workerr. Management plans discussed with the patient, family and they are in agreement.  CODE STATUS: full TOTAL TIME TAKING CARE OF THIS PATIENT: 28 minutes.   POSSIBLE D/C IN 1DAYS, DEPENDING ON CLINICAL CONDITION.   Hower,  Mardi MainlandDavid K M.D on 01/10/2016 at 11:41 AM  Between 7am to 6pm - Pager - [phone]  After 6pm: House Pager: - [phone]  [facility] [place]  Office  [phone]  CC: Primary care physician; [facility]

## 2016-01-10 NOTE — Progress Notes (Signed)
Gastroenterology Progress Note    [name] 55 y.o. [birth year]   Subjective: Continue to have painful swallowing with clear liquids. Denies abdominal pain.  Objective: Vital signs in last 24 hours: Vitals:   01/10/16 0532 01/10/16 0937  BP: 113/70 118/77  Pulse: 67 83  Resp: 16 18  Temp: 97.8 F (36.6 C) 98 F (36.7 C)    Physical Exam: Gen: alert, no acute distress HEENT: poor dentition CV: RRR Chest: CTA B Abd: minimal epigastric tenderness with guarding, soft, nondistended, +BS Ext: no edema  Lab Results:  Recent Labs  01/09/16 0539  NA 140  K 3.5  CL 111  CO2 23  GLUCOSE 93  BUN 15  CREATININE 0.94  CALCIUM 9.1    Recent Labs  01/09/16 0539  AST 11*  ALT 13*  ALKPHOS 53  BILITOT 0.7  PROT 6.3*  ALBUMIN 3.3*    Recent Labs  01/09/16 0539  WBC 6.9  HGB 13.3  HCT 38.6*  MCV 91.7  PLT 203   No results for input(s): LABPROT, INR in the last 72 hours.    Assessment/Plan: Odynophagia and N/V despite empiric Nystatin. Will do EGD tomorrow to further evaluate. Clear liquid diet. NPO p MN.   [name] C. 01/10/2016, 1:05 PM

## 2016-01-11 ENCOUNTER — Encounter: Payer: Self-pay | Admitting: *Deleted

## 2016-01-11 ENCOUNTER — Observation Stay: Payer: Medicaid Other | Admitting: Registered Nurse

## 2016-01-11 ENCOUNTER — Encounter
Admission: EM | Disposition: A | Discharge status: Home / Self Care | Payer: Self-pay | Source: Home / Self Care | Attending: Emergency Medicine

## 2016-01-11 DIAGNOSIS — R112 Nausea with vomiting, unspecified: Secondary | ICD-10-CM | POA: Diagnosis present

## 2016-01-11 DIAGNOSIS — R1310 Dysphagia, unspecified: Secondary | ICD-10-CM

## 2016-01-11 HISTORY — PX: ESOPHAGOGASTRODUODENOSCOPY: SHX5428

## 2016-01-11 SURGERY — EGD (ESOPHAGOGASTRODUODENOSCOPY)
Anesthesia: General

## 2016-01-11 MED ORDER — PANTOPRAZOLE SODIUM 40 MG IV SOLR
40.0000 mg | Freq: Two times a day (BID) | INTRAVENOUS | Status: DC
  Administered 2016-01-11 – 2016-01-12 (×3): 40 mg via INTRAVENOUS
  Filled 2016-01-11 (×3): qty 40

## 2016-01-11 MED ORDER — PROPOFOL 500 MG/50ML IV EMUL
INTRAVENOUS | Status: DC | PRN
  Administered 2016-01-11: 150 ug/kg/min via INTRAVENOUS

## 2016-01-11 MED ORDER — PROPOFOL 10 MG/ML IV BOLUS
INTRAVENOUS | Status: DC | PRN
  Administered 2016-01-11: 80 mg via INTRAVENOUS
  Administered 2016-01-11: 50 mg via INTRAVENOUS
  Administered 2016-01-11: 30 mg via INTRAVENOUS
  Administered 2016-01-11 (×2): 20 mg via INTRAVENOUS

## 2016-01-11 MED ORDER — LIDOCAINE 2% (20 MG/ML) 5 ML SYRINGE
INTRAMUSCULAR | Status: AC
  Filled 2016-01-11: qty 5

## 2016-01-11 MED ORDER — SUCRALFATE 1 GM/10ML PO SUSP
1.0000 g | Freq: Three times a day (TID) | ORAL | Status: DC
  Administered 2016-01-11 – 2016-01-12 (×5): 1 g via ORAL
  Filled 2016-01-11 (×12): qty 10

## 2016-01-11 MED ORDER — SODIUM CHLORIDE 0.9 % IV SOLN
INTRAVENOUS | Status: DC
  Administered 2016-01-11: 16:00:00 via INTRAVENOUS

## 2016-01-11 MED ORDER — SODIUM CHLORIDE 0.9 % IV SOLN
INTRAVENOUS | Status: DC
  Administered 2016-01-11: 1 mL via INTRAVENOUS

## 2016-01-11 MED ORDER — PROPOFOL 10 MG/ML IV BOLUS
INTRAVENOUS | Status: AC
  Filled 2016-01-11: qty 20

## 2016-01-11 NOTE — Progress Notes (Signed)
Per Dr. Cherlynn KaiserSainani okay to place patient on NS at 275ml/hr for maintenance IV fluids. Pt is only taking small sips in.

## 2016-01-11 NOTE — Op Note (Signed)
[facility] Patient Name: [name] Procedure Date: 01/11/2016 8:49 AM MRN: [id number] Account #: [id number] Date of Birth: 05-25-1960 Admit Type: Inpatient Age: [age] [facility] Gender: Male Note Status: Finalized Procedure:            Upper GI endoscopy Indications:          Odynophagia, Nausea with vomiting Providers:            Shirley FriarVincent C. Cha Gomillion, MD Referring MD:         Dustin Folkslinic Scott, MD (Referring MD) Medicines:            Propofol per Anesthesia, Monitored Anesthesia Care Complications:        No immediate complications. Procedure:            Pre-Anesthesia Assessment:                       - Prior to the procedure, a History and Physical was                        performed, and patient medications and allergies were                        reviewed. The patient's tolerance of previous                        anesthesia was also reviewed. The risks and benefits of                        the procedure and the sedation options and risks were                        discussed with the patient. All questions were                        answered, and informed consent was obtained. Prior                        Anticoagulants: The patient has taken no previous                        anticoagulant or antiplatelet agents. ASA Grade                        Assessment: III - A patient with severe systemic                        disease. After reviewing the risks and benefits, the                        patient was deemed in satisfactory condition to undergo                        the procedure.                       After obtaining informed consent, the endoscope was                        passed under direct vision. Throughout the procedure,  the patient's blood pressure, pulse, and oxygen                        saturations were monitored continuously. The Endoscope                        was introduced through the  mouth, and advanced to the                        second part of duodenum. The upper GI endoscopy was                        accomplished without difficulty. The patient tolerated                        the procedure fairly well. Findings:      LA Grade D (one or more mucosal breaks involving at least 75% of       esophageal circumference) esophagitis with no bleeding was found in the       lower third of the esophagus. Biopsies were taken with a cold forceps       for histology. Estimated blood loss was minimal. Cells for cytology were       obtained by brushing.      Few linear esophageal ulcers with no bleeding and no stigmata of recent       bleeding were found 36 to 40 cm from the incisors.      Patchy mild inflammation characterized by congestion (edema), erosions       and erythema was found in the gastric antrum.      The examined duodenum was normal.      A small hiatal hernia was present. Impression:           - LA Grade D acute and erosive esophagitis. Biopsied.                        Cells for cytology obtained.                       - Non-bleeding esophageal ulcers.                       - Acute gastritis.                       - Normal examined duodenum.                       - Small hiatal hernia. Recommendation:       - NPO.                       - Await pathology results.                       - Use Protonix (pantoprazole) 40 mg IV BID. Procedure Code(s):    --- Professional ---                       [phone], Esophagogastroduodenoscopy, flexible, transoral;                        with biopsy, single or multiple Diagnosis Code(s):    ---  Professional ---                       R13.10, Dysphagia, unspecified                       K22.10, Ulcer of esophagus without bleeding                       K29.00, Acute gastritis without bleeding                       R11.2, Nausea with vomiting, unspecified                       K20.8, Other esophagitis                       K44.9,  Diaphragmatic hernia without obstruction or                        gangrene CPT copyright 2016 American Medical Association. All rights reserved. The codes documented in this report are preliminary and upon coder review may  be revised to meet current compliance requirements. Shirley Friar, MD 01/11/2016 9:24:28 AM This report has been signed electronically. Number of Addenda: 0 Note Initiated On: 01/11/2016 8:49 AM      [facility]

## 2016-01-11 NOTE — Transfer of Care (Signed)
Immediate Anesthesia Transfer of Care Note  Patient: [name]  Procedure(s) Performed: Procedure(s): ESOPHAGOGASTRODUODENOSCOPY (EGD) (N/A)  Patient Location: PACU  Anesthesia Type:General  Level of Consciousness: sedated  Airway & Oxygen Therapy: Patient Spontanous Breathing and Patient connected to nasal cannula oxygen  Post-op Assessment: Report given to RN and Post -op Vital signs reviewed and stable  Post vital signs: Reviewed and stable  Last Vitals:  Vitals:   01/11/16 0910 01/11/16 0912  BP: 98/63 98/63  Pulse: 74 73  Resp: 16   Temp:      Last Pain:  Vitals:   01/11/16 0912  TempSrc:   PainSc: Asleep      Patients Stated Pain Goal: 0 (01/10/16 2001)  Complications: No apparent anesthesia complications

## 2016-01-11 NOTE — Interval H&P Note (Signed)
History and Physical Interval Note:  01/11/2016 8:50 AM  [name]  has presented today for surgery, with the diagnosis of Nausea and Vomiting; Odynophagia  The various methods of treatment have been discussed with the patient and family. After consideration of risks, benefits and other options for treatment, the patient has consented to  Procedure(s): ESOPHAGOGASTRODUODENOSCOPY (EGD) (N/A) as a surgical intervention .  The patient's history has been reviewed, patient examined, no change in status, stable for surgery.  I have reviewed the patient's chart and labs.  Questions were answered to the patient's satisfaction.     [name]

## 2016-01-11 NOTE — Anesthesia Preprocedure Evaluation (Addendum)
Anesthesia Evaluation  Patient identified by MRN, date of birth, ID band Patient awake    Reviewed: Allergy & Precautions, NPO status , Patient's Chart, lab work & pertinent test results  History of Anesthesia Complications Negative for: history of anesthetic complications  Airway Mallampati: II       Dental   Pulmonary neg pulmonary ROS, shortness of breath and with exertion, former smoker,    Pulmonary exam normal        Cardiovascular hypertension, Pt. on medications + angina + CAD  Normal cardiovascular exam     Neuro/Psych PSYCHIATRIC DISORDERS Anxiety Depression Bipolar Disorder negative neurological ROS     GI/Hepatic GERD  Medicated and Controlled,(+) Hepatitis -, C  Endo/Other  negative endocrine ROS  Renal/GU Renal disease: stones.stones  negative genitourinary   Musculoskeletal   Abdominal Normal abdominal exam  (+)   Peds negative pediatric ROS (+)  Hematology negative hematology ROS (+) anemia ,   Anesthesia Other Findings Past Medical History: No date: Anxiety No date: Arthritis     Comment: knees and hands No date: Bipolar disorder (HCC) No date: Depression No date: GERD (gastroesophageal reflux disease) No date: Hepatitis     Comment: HEP "C" No date: Hypertension No date: Kidney stones 05/2015: Pericarditis     Comment: a. echo 5/17: EF 60-65%, no RWMA, LV dias fxn               nl, LA mildly dilated, RV sys fxn nl, PASP nl,               moderate sized circumferential pericardial               effusion was identified, 2.12 cm around the LV               free wall, <1 cm around the RV free wall.               Features were not c/w tamponade physiology No date: PTSD (post-traumatic stress disorder)     Comment: Witnessed brother's suicide. No date: Restless leg syndrome No date: Syncope  Reproductive/Obstetrics                             Anesthesia  Physical  Anesthesia Plan  ASA: III and emergent  Anesthesia Plan: General   Post-op Pain Management:    Induction: Intravenous  Airway Management Planned: Nasal Cannula  Additional Equipment:   Intra-op Plan:   Post-operative Plan:   Informed Consent: I have reviewed the patients History and Physical, chart, labs and discussed the procedure including the risks, benefits and alternatives for the proposed anesthesia with the patient or authorized representative who has indicated his/her understanding and acceptance.   Dental advisory given  Plan Discussed with: CRNA and Surgeon  Anesthesia Plan Comments:        Anesthesia Quick Evaluation

## 2016-01-11 NOTE — H&P (View-Only) (Signed)
Gastroenterology Progress Note    [name] 55 y.o. 10/13/1960   Subjective: Continue to have painful swallowing with clear liquids. Denies abdominal pain.  Objective: Vital signs in last 24 hours: Vitals:   01/10/16 0532 01/10/16 0937  BP: 113/70 118/77  Pulse: 67 83  Resp: 16 18  Temp: 97.8 F (36.6 C) 98 F (36.7 C)    Physical Exam: Gen: alert, no acute distress HEENT: poor dentition CV: RRR Chest: CTA B Abd: minimal epigastric tenderness with guarding, soft, nondistended, +BS Ext: no edema  Lab Results:  Recent Labs  01/09/16 0539  NA 140  K 3.5  CL 111  CO2 23  GLUCOSE 93  BUN 15  CREATININE 0.94  CALCIUM 9.1    Recent Labs  01/09/16 0539  AST 11*  ALT 13*  ALKPHOS 53  BILITOT 0.7  PROT 6.3*  ALBUMIN 3.3*    Recent Labs  01/09/16 0539  WBC 6.9  HGB 13.3  HCT 38.6*  MCV 91.7  PLT 203   No results for input(s): LABPROT, INR in the last 72 hours.    Assessment/Plan: Odynophagia and N/V despite empiric Nystatin. Will do EGD tomorrow to further evaluate. Clear liquid diet. NPO p MN.   [name] C. 01/10/2016, 1:05 PM 

## 2016-01-11 NOTE — Brief Op Note (Addendum)
Severe distal ulcerative esophagitis - question herpes vs acid reflux vs Candida (less likely). See procedure note for complete details. Change to IV PPI Q 12 hours. Change to Carafate slurry. NPO except sips and ice chips. Biopsies taken. May need anti-viral or anti-fungal therapy but await brushings and biopsies. Consider HIV testing. Supportive care.

## 2016-01-11 NOTE — Progress Notes (Signed)
Per Dr. Cherlynn KaiserSainani okay to hold BP meds enalapril and metoprolol as BP was low last night and normalized now. Will monitor and check with GI in regard to diet.

## 2016-01-11 NOTE — Progress Notes (Signed)
[facility] - [place] [facility]   PATIENT NAME: [name]    MR#:  [id number]  DATE OF BIRTH:  April 30, 1960  SUBJECTIVE:   Status post endoscopy today showing severe erosive esophagitis with esophageal ulcers. Continues to complain of heartburn and epigastric abdominal pain.  REVIEW OF SYSTEMS:    Review of Systems  Constitutional: Negative for chills and fever.  HENT: Negative for congestion and tinnitus.   Eyes: Negative for blurred vision and double vision.  Respiratory: Negative for cough, shortness of breath and wheezing.   Cardiovascular: Negative for chest pain, orthopnea and PND.  Gastrointestinal: Positive for abdominal pain and heartburn. Negative for diarrhea, nausea and vomiting.  Genitourinary: Negative for dysuria and hematuria.  Neurological: Negative for dizziness, sensory change and focal weakness.  All other systems reviewed and are negative.   Nutrition: NPO Tolerating Diet: No Tolerating PT: Ambulatory  DRUG ALLERGIES:   Allergies  Allergen Reactions  . Toradol [Ketorolac Tromethamine] Hives    VITALS:  Blood pressure 134/87, pulse 71, temperature 97.8 F (36.6 C), temperature source Oral, resp. rate 18, height 6' (1.829 m), weight 80.7 kg (178 lb), SpO2 98 %.  PHYSICAL EXAMINATION:   Physical Exam  GENERAL:  56 y.o.-year-old patient lying in bed in no acute distress.  EYES: Pupils equal, round, reactive to light and accommodation. No scleral icterus. Extraocular muscles intact.  HEENT: Head atraumatic, normocephalic. Oropharynx and nasopharynx clear.  NECK:  Supple, no jugular venous distention. No thyroid enlargement, no tenderness.  LUNGS: Normal breath sounds bilaterally, no wheezing, rales, rhonchi. No use of accessory muscles of respiration.  CARDIOVASCULAR: S1, S2 normal. No murmurs, rubs, or gallops.  ABDOMEN: Soft, Tender in epigastric area, no rebound, rigidity, nondistended. Bowel sounds present. No organomegaly or  mass.  EXTREMITIES: No cyanosis, clubbing or edema b/l.    NEUROLOGIC: Cranial nerves II through XII are intact. No focal Motor or sensory deficits b/l.   PSYCHIATRIC: The patient is alert and oriented x 3.  SKIN: No obvious rash, lesion, or ulcer.    LABORATORY PANEL:   CBC  Recent Labs Lab 01/09/16 0539  WBC 6.9  HGB 13.3  HCT 38.6*  PLT 203   ------------------------------------------------------------------------------------------------------------------  Chemistries   Recent Labs Lab 01/09/16 0539  NA 140  K 3.5  CL 111  CO2 23  GLUCOSE 93  BUN 15  CREATININE 0.94  CALCIUM 9.1  AST 11*  ALT 13*  ALKPHOS 53  BILITOT 0.7   ------------------------------------------------------------------------------------------------------------------  Cardiac Enzymes  Recent Labs Lab 01/07/16 0830  TROPONINI <0.03   ------------------------------------------------------------------------------------------------------------------  RADIOLOGY:  No results found.   ASSESSMENT AND PLAN:   56 year old male with past medical history of PTSD, restless leg syndrome, hypertension, GERD, bipolar disorder, anxiety who presented to the hospital due to epigastric abdominal pain and heartburn.  1. Epigastric abdominal pain/heartburn-secondary to severe erosive esophagitis with esophageal ulcers.  -Status post endoscopy showing the findings as mentioned above.  -Continue Carafate, IV Protonix, nystatin swish and swallow. -Appreciate gastroenterology input. Currently nothing by mouth and will consider starting clear liquids by tomorrow.  2. History of bipolar disorder-continue Depakote.  3. Hyperlipidemia-continue Crestor.  4. BPH - cont. Flomax.   5. HTN -cont. Metoprolol.   6. Depression - cont. Prozac   All the records are reviewed and case discussed with Care Management/Social Worker. Management plans discussed with the patient, family and they are in  agreement.  CODE STATUS: Full   DVT Prophylaxis: Lovenox  TOTAL TIME TAKING CARE  OF THIS PATIENT: 30 minutes.   POSSIBLE D/C IN 1-2 DAYS, DEPENDING ON CLINICAL CONDITION.   Houston Siren M.D on 01/11/2016 at 3:44 PM  Between 7am to 6pm - Pager - [phone]  After 6pm go to [email or web address] - Therapist, nutritional Hospitalists  Office  [phone]  CC: Primary care physician; [facility]

## 2016-01-11 NOTE — Progress Notes (Signed)
Per Dr. Bosie ClosSchooler cancel advance as tolerated

## 2016-01-12 ENCOUNTER — Encounter: Payer: Self-pay | Admitting: Gastroenterology

## 2016-01-12 DIAGNOSIS — K209 Esophagitis, unspecified without bleeding: Secondary | ICD-10-CM

## 2016-01-12 DIAGNOSIS — D72829 Elevated white blood cell count, unspecified: Secondary | ICD-10-CM

## 2016-01-12 DIAGNOSIS — R1310 Dysphagia, unspecified: Secondary | ICD-10-CM

## 2016-01-12 DIAGNOSIS — Z9889 Other specified postprocedural states: Secondary | ICD-10-CM

## 2016-01-12 DIAGNOSIS — R112 Nausea with vomiting, unspecified: Secondary | ICD-10-CM | POA: Diagnosis not present

## 2016-01-12 DIAGNOSIS — E876 Hypokalemia: Secondary | ICD-10-CM

## 2016-01-12 LAB — H PYLORI, IGM, IGG, IGA AB: H. Pylogi, Iga Abs: <9 units (ref 0.0–8.9)

## 2016-01-12 LAB — KOH PREP: KOH Prep: NONE SEEN

## 2016-01-12 MED ORDER — SUCRALFATE 1 G PO TABS: 1.0000 g | ORAL_TABLET | Freq: Three times a day (TID) | ORAL | 5 refills | Status: DC

## 2016-01-12 MED ORDER — GI COCKTAIL ~~LOC~~: 30.0000 mL | Freq: Three times a day (TID) | ORAL | 0 refills | Status: DC | PRN

## 2016-01-12 NOTE — Plan of Care (Signed)
Problem: Fluid Volume: Goal: Ability to maintain a balanced intake and output will improve Outcome: Not Progressing Patient is NPO except sips of water, ice chips, and sips with meds.  Problem: Nutrition: Goal: Adequate nutrition will be maintained Outcome: Not Progressing Patient is NPO except ice chips, sips of water, and sips with meds.

## 2016-01-12 NOTE — Anesthesia Postprocedure Evaluation (Signed)
Anesthesia Post Note  Patient: Victor DrapeJames C Ferrero  Procedure(s) Performed: Procedure(s) (LRB): ESOPHAGOGASTRODUODENOSCOPY (EGD) (N/A)  Patient location during evaluation: PACU Anesthesia Type: General Level of consciousness: awake and alert and oriented Pain management: pain level controlled Vital Signs Assessment: post-procedure vital signs reviewed and stable Respiratory status: spontaneous breathing Cardiovascular status: blood pressure returned to baseline Anesthetic complications: no     Last Vitals:  Vitals:   01/12/16 0542 01/12/16 0946  BP: 98/63 100/60  Pulse: 68 66  Resp: 18   Temp: 36.5 C     Last Pain:  Vitals:   01/12/16 0730  TempSrc:   PainSc: Asleep                 Takeyah Wieman

## 2016-01-12 NOTE — Progress Notes (Signed)
  Victor Miniumarren Deuce Paternoster, MD [facility]   [address] [place], [place] [address] Phone: [phone] Fax : [phone]   Subjective: This patient was admitted with odynophagia and nausea and vomiting. The patient had an EGD with esophagitis. The patient has been feeling better since admission and states that he is now ready to go home. He has no complaints at the present time and states that he knows that he has to start eating better.   Objective: Vital signs in last 24 hours: Vitals:   01/11/16 1322 01/11/16 2010 01/12/16 0542 01/12/16 0946  BP: 134/87 122/75 98/63 100/60  Pulse: 71 68 68 66  Resp: 18 20 18    Temp: 97.8 F (36.6 C) 97.9 F (36.6 C) 97.7 F (36.5 C)   TempSrc: Oral Oral Oral   SpO2: 98% 97% 98%   Weight:      Height:       Weight change:   Intake/Output Summary (Last 24 hours) at 01/12/16 0948 Last data filed at 01/12/16 0545  Gross per 24 hour  Intake                0 ml  Output             1000 ml  Net            -1000 ml     Exam: Heart:: Regular rate and rhythm, S1S2 present or without murmur or extra heart sounds Lungs: normal and clear to auscultation Abdomen: soft, nontender, normal bowel sounds   Lab Results: @LABTEST2 @ Micro Results: No results found for this or any previous visit (from the past 240 hour(s)). Studies/Results: No results found. Medications: I have reviewed the patient's current medications. Scheduled Meds: . divalproex  500 mg Oral BID  . enalapril  10 mg Oral Daily  . enoxaparin (LOVENOX) injection  40 mg Subcutaneous Q24H  . FLUoxetine  40 mg Oral Daily  . gabapentin  400 mg Oral TID  . metoprolol tartrate  25 mg Oral BID  . nystatin  5 mL Oral QID  . pantoprazole (PROTONIX) IV  40 mg Intravenous Q12H  . QUEtiapine  400 mg Oral QHS  . rosuvastatin  10 mg Oral Daily  . sodium chloride flush  3 mL Intravenous Q12H  . sucralfate  1 g Oral TID WC & HS  . tamsulosin  0.4 mg Oral QPC breakfast   Continuous  Infusions: . sodium chloride 75 mL/hr at 01/11/16 1627   PRN Meds:.acetaminophen **OR** acetaminophen, albuterol, benztropine, bisacodyl, gi cocktail, magnesium citrate, methocarbamol, ondansetron **OR** ondansetron (ZOFRAN) IV, oxyCODONE, QUEtiapine, senna-docusate, zolpidem   Assessment: Active Problems:   Abdominal pain   Nausea and vomiting   Odynophagia    Plan: She comes in with [name] nausea vomiting. The patient states overall his symptoms have resolved at this time. The patient wants to go home today. The patient can be discharged from a GI point of view. The patient should follow up as an outpatient for possible treatment of his hepatitis C and his pathology. The patient has been explained the plan and agrees with it.   LOS: 0 days   Victor MiniumDarren Shalise [name] 01/12/2016, 9:48 AM

## 2016-01-12 NOTE — Progress Notes (Signed)
Patient discharged to home as ordered. Prescription given for GI cocktail as ordered. No follow up appointments ordered. Patient denies pain at this time No nausea or vomiting noted and patient denies being nauseated after eating lunch. Patient is alert and oriented, ambulates without assistance.

## 2016-01-12 NOTE — Discharge Summary (Signed)
[facility] [place] at [facility]   PATIENT NAME: [name]    MR#:  [id number]  DATE OF BIRTH:  [birth year]  DATE OF ADMISSION:  01/06/2016 ADMITTING PHYSICIAN: Tonye RoyaltyAlexis Hugelmeyer, DO  DATE OF DISCHARGE: 01/12/2016  2:45 PM  PRIMARY CARE PHYSICIAN: [facility]     ADMISSION DIAGNOSIS:  Epigastric pain [R10.13] Abnormal EKG [R94.31]  DISCHARGE DIAGNOSIS:  Principal Problem:   Abdominal pain Active Problems:   Esophagitis   Nausea and vomiting   Odynophagia   History of esophagogastroduodenoscopy (EGD)   Hypokalemia   Leukocytosis   SECONDARY DIAGNOSIS:   Past Medical History:  Diagnosis Date  . Anxiety   . Arthritis    knees and hands  . Bipolar disorder (HCC)   . Depression   . GERD (gastroesophageal reflux disease)   . Hepatitis    HEP "C"  . Hypertension   . Kidney stones   . Pericarditis 05/2015   a. echo 5/17: EF 60-65%, no RWMA, LV dias fxn nl, LA mildly dilated, RV sys fxn nl, PASP nl, moderate sized circumferential pericardial effusion was identified, 2.12 cm around the LV free wall, <1 cm around the RV free wall. Features were not c/w tamponade physiology  . PTSD (post-traumatic stress disorder)    Witnessed brother's suicide.  Marland Kitchen. Restless leg syndrome   . Syncope     .pro HOSPITAL COURSE:   Patient is 56 year old Caucasian male with past medical history significant for history of multiple medical problems including bipolar disorder, depression, gastroesophageal reflux disease, hepatitis, hep C, hypertension, kidney stones, post traumatic stress disorder, restless leg syndrome, syncope, who presents to the hospital with complaints of dysphagia, odynophagia, neck/chest pain, abdominal pain, associated with perfuse vomiting. Patient was given a GI cocktail. In emergency room, which improved his symptoms. Patient was admitted to the hospital for further evaluation and treatment. Was initiated on Protonix,  Carafate, gastroenteritis consultation was requested. The neurologist saw patient in consultation and recommended EGD. It was performed first of January 2018, revealing distal ulcerative esophagitis, concerning for hepatic versus acid reflux versus candidiasis. Patient was recommended to continue Carafate and Protonix, and follow up with gastroenterologist as outpatient when brushings and biopsies are back. Patient's condition improved, his diet was advanced to soft and he was discharged home Discussion by problem:  #1 Distal ulcerative esophagitis, patient is to continue Protonix, Carafate, follow-up with the dermatologist as outpatient for recommendations  #2 bipolar disorder, patient's continue outpatient medications. No changes were made #3. Essential hypertension, patient's blood pressure was well-controlled, patient is to continue Vasotec #4. Hyperlipidemia, continue Crestor at home doses.    DISCHARGE CONDITIONS:   Stable  CONSULTS OBTAINED:  Treatment Team:  Wyline MoodKiran Anna, MD  DRUG ALLERGIES:   Allergies  Allergen Reactions  . Toradol [Ketorolac Tromethamine] Hives    DISCHARGE MEDICATIONS:   Discharge Medication List as of 01/12/2016  2:08 PM    START taking these medications   Details  Alum & Mag Hydroxide-Simeth (GI COCKTAIL) SUSP suspension Take 30 mLs by mouth 3 (three) times daily as needed for indigestion. Shake well., Starting Tue 01/12/2016, Normal    sucralfate (CARAFATE) 1 g tablet Take 1 tablet (1 g total) by mouth 4 (four) times daily -  with meals and at bedtime., Starting Tue 01/12/2016, Normal      CONTINUE these medications which have NOT CHANGED   Details  benztropine (COGENTIN) 0.5 MG tablet Take 1 tablet (0.5 mg total) by mouth 2 (two) times  daily as needed for tremors., Starting 04/25/2015, Until Discontinued, Normal    divalproex (DEPAKOTE) 500 MG DR tablet Take 500 mg by mouth 2 (two) times daily., Until Discontinued, Historical Med    enalapril (VASOTEC)  10 MG tablet Take 10 mg by mouth daily., Until Discontinued, Historical Med    FLUoxetine (PROZAC) 20 MG capsule Take 40 mg by mouth daily. , Historical Med    gabapentin (NEURONTIN) 400 MG capsule Take 1 capsule (400 mg total) by mouth 3 (three) times daily., Starting 04/25/2015, Until Discontinued, Normal    methocarbamol (ROBAXIN) 500 MG tablet Take 1 tablet (500 mg total) by mouth every 6 (six) hours as needed for muscle spasms., Starting 04/25/2015, Until Discontinued, Normal    pantoprazole (PROTONIX) 40 MG tablet Take 1 tablet (40 mg total) by mouth 2 (two) times daily., Starting Tue 06/02/2015, Normal    QUEtiapine (SEROQUEL) 50 MG tablet Take 50 mg by mouth 4 (four) times daily as needed (for agitation). , Until Discontinued, Historical Med    rosuvastatin (CRESTOR) 10 MG tablet Take 1 tablet (10 mg total) by mouth daily., Starting Tue 06/02/2015, Normal    SEROQUEL XR 400 MG 24 hr tablet Take 400 mg by mouth at bedtime. , Until Discontinued, Historical Med    levofloxacin (LEVAQUIN) 500 MG tablet Take 1 tablet (500 mg total) by mouth daily. X 6 more days, Starting Tue 06/02/2015, Normal    tamsulosin (FLOMAX) 0.4 MG CAPS capsule Take 0.4 mg by mouth daily after breakfast., Until Discontinued, Historical Med         DISCHARGE INSTRUCTIONS:    Patient is to follow-up with primary care physician, gastroenterologist as outpatient  If you experience worsening of your admission symptoms, develop shortness of breath, life threatening emergency, suicidal or homicidal thoughts you must seek medical attention immediately by calling 911 or calling your MD immediately  if symptoms less severe.  You Must read complete instructions/literature along with all the possible adverse reactions/side effects for all the Medicines you take and that have been prescribed to you. Take any new Medicines after you have completely understood and accept all the possible adverse reactions/side effects.    Please note  You were cared for by a hospitalist during your hospital stay. If you have any questions about your discharge medications or the care you received while you were in the hospital after you are discharged, you can call the unit and asked to speak with the hospitalist on call if the hospitalist that took care of you is not available. Once you are discharged, your primary care physician will handle any further medical issues. Please note that NO REFILLS for any discharge medications will be authorized once you are discharged, as it is imperative that you return to your primary care physician (or establish a relationship with a primary care physician if you do not have one) for your aftercare needs so that they can reassess your need for medications and monitor your lab values.    Today   CHIEF COMPLAINT:   Chief Complaint  Patient presents with  . Influenza    HISTORY OF PRESENT ILLNESS:  [name]  is a 56 y.o. male with a known history of multiple medical problems including bipolar disorder, depression, gastroesophageal reflux disease, hepatitis, hep C, hypertension, kidney stones, post traumatic stress disorder, restless leg syndrome, syncope, who presents to the hospital with complaints of dysphagia, odynophagia, neck/chest pain, abdominal pain, associated with perfuse vomiting. Patient was given a GI cocktail. In emergency  room, which improved his symptoms. Patient was admitted to the hospital for further evaluation and treatment. Was initiated on Protonix, Carafate, gastroenteritis consultation was requested. The neurologist saw patient in consultation and recommended EGD. It was performed first of January 2018, revealing distal ulcerative esophagitis, concerning for hepatic versus acid reflux versus candidiasis. Patient was recommended to continue Carafate and Protonix, and follow up with gastroenterologist as outpatient when brushings and biopsies are back. Patient's condition  improved, his diet was advanced to soft and he was discharged home Discussion by problem:  #1 Distal ulcerative esophagitis, patient is to continue Protonix, Carafate, follow-up with the dermatologist as outpatient for recommendations  #2 bipolar disorder, patient's continue outpatient medications. No changes were made #3. Essential hypertension, patient's blood pressure was well-controlled, patient is to continue Vasotec #4. Hyperlipidemia, continue Crestor at home doses.     VITAL SIGNS:  Blood pressure 100/60, pulse 66, temperature 97.7 F (36.5 C), temperature source Oral, resp. rate 18, height 6' (1.829 m), weight 80.7 kg (178 lb), SpO2 98 %.  I/O:   Intake/Output Summary (Last 24 hours) at 01/12/16 1708 Last data filed at 01/12/16 1334  Gross per 24 hour  Intake              480 ml  Output              950 ml  Net             -470 ml    PHYSICAL EXAMINATION:  GENERAL:  56 y.o.-year-old patient lying in the bed with no acute distress.  EYES: Pupils equal, round, reactive to light and accommodation. No scleral icterus. Extraocular muscles intact.  HEENT: Head atraumatic, normocephalic. Oropharynx and nasopharynx clear.  NECK:  Supple, no jugular venous distention. No thyroid enlargement, no tenderness.  LUNGS: Normal breath sounds bilaterally, no wheezing, rales,rhonchi or crepitation. No use of accessory muscles of respiration.  CARDIOVASCULAR: S1, S2 normal. No murmurs, rubs, or gallops.  ABDOMEN: Soft, non-tender, non-distended. Bowel sounds present. No organomegaly or mass.  EXTREMITIES: No pedal edema, cyanosis, or clubbing.  NEUROLOGIC: Cranial nerves II through XII are intact. Muscle strength 5/5 in all extremities. Sensation intact. Gait not checked.  PSYCHIATRIC: The patient is alert and oriented x 3.  SKIN: No obvious rash, lesion, or ulcer.   DATA REVIEW:   CBC  Recent Labs Lab 01/09/16 0539  WBC 6.9  HGB 13.3  HCT 38.6*  PLT 203    Chemistries    Recent Labs Lab 01/09/16 0539  NA 140  K 3.5  CL 111  CO2 23  GLUCOSE 93  BUN 15  CREATININE 0.94  CALCIUM 9.1  AST 11*  ALT 13*  ALKPHOS 53  BILITOT 0.7    Cardiac Enzymes  Recent Labs Lab 01/07/16 0830  TROPONINI <0.03    Microbiology Results  Results for orders placed or performed during the hospital encounter of 01/06/16  KOH prep     Status: None   Collection Time: 01/11/16  9:23 AM  Result Value Ref Range Status   Specimen Description BRONCHIAL BRUSHING  Final   Special Requests NONE  Final   KOH Prep NO YEAST OR FUNGAL ELEMENTS SEEN  Final   Report Status 01/12/2016 FINAL  Final    RADIOLOGY:  No results found.  EKG:   Orders placed or performed during the hospital encounter of 01/06/16  . ED EKG  . ED EKG  . EKG 12-Lead  . EKG 12-Lead  . EKG 12-Lead  Management plans discussed with the patient, family and they are in agreement.  CODE STATUS:     Code Status Orders        Start     Ordered   01/06/16 2006  Full code  Continuous     01/06/16 2005    Code Status History    Date Active Date Inactive Code Status Order ID Comments User Context   06/01/2015  6:47 PM 06/02/2015  7:09 PM Full Code [id number]  Ramonita Lab, MD Inpatient   05/13/2015  1:11 AM 05/14/2015 12:55 PM Full Code [id number]  Ihor Austin, MD Inpatient      TOTAL TIME TAKING CARE OF THIS PATIENT: 40  minutes.    Katharina Caper M.D on 01/12/2016 at 5:08 PM  Between 7am to 6pm - Pager - [phone]  After 6pm go to [email or web address] - password [email or web address] [facility]  [place] [facility]  Office  [phone]  CC: Primary care physician; [facility]   Back

## 2016-01-14 LAB — SURGICAL PATHOLOGY

## 2016-01-26 ENCOUNTER — Encounter: Payer: Self-pay | Admitting: Emergency Medicine

## 2016-01-26 ENCOUNTER — Emergency Department
Admission: EM | Admit: 2016-01-26 | Discharge: 2016-01-27 | Disposition: A | Discharge status: Home / Self Care | Payer: Medicaid Other | Attending: Emergency Medicine | Admitting: Emergency Medicine

## 2016-01-26 ENCOUNTER — Emergency Department: Payer: Medicaid Other

## 2016-01-26 DIAGNOSIS — R079 Chest pain, unspecified: Secondary | ICD-10-CM | POA: Diagnosis not present

## 2016-01-26 DIAGNOSIS — I10 Essential (primary) hypertension: Secondary | ICD-10-CM | POA: Insufficient documentation

## 2016-01-26 DIAGNOSIS — Z87891 Personal history of nicotine dependence: Secondary | ICD-10-CM | POA: Insufficient documentation

## 2016-01-26 DIAGNOSIS — I2510 Atherosclerotic heart disease of native coronary artery without angina pectoris: Secondary | ICD-10-CM | POA: Insufficient documentation

## 2016-01-26 DIAGNOSIS — R1031 Right lower quadrant pain: Secondary | ICD-10-CM | POA: Diagnosis present

## 2016-01-26 DIAGNOSIS — Z79899 Other long term (current) drug therapy: Secondary | ICD-10-CM | POA: Diagnosis not present

## 2016-01-26 LAB — URINALYSIS, COMPLETE (UACMP) WITH MICROSCOPIC
BACTERIA UA: NONE SEEN
BILIRUBIN URINE: NEGATIVE
GLUCOSE, UA: NEGATIVE mg/dL
Ketones, ur: NEGATIVE mg/dL
LEUKOCYTES UA: NEGATIVE
NITRITE: NEGATIVE
PROTEIN: NEGATIVE mg/dL
Specific Gravity, Urine: 1.014 (ref 1.005–1.030)
pH: 5 (ref 5.0–8.0)

## 2016-01-26 LAB — CBC
HEMATOCRIT: 48.8 % (ref 40.0–52.0)
HEMOGLOBIN: 16.5 g/dL (ref 13.0–18.0)
MCH: 30.3 pg (ref 26.0–34.0)
MCHC: 33.7 g/dL (ref 32.0–36.0)
MCV: 89.8 fL (ref 80.0–100.0)
Platelets: 439 10*3/uL (ref 150–440)
RBC: 5.44 MIL/uL (ref 4.40–5.90)
RDW: 13.9 % (ref 11.5–14.5)
WBC: 12.8 10*3/uL — AB (ref 3.8–10.6)

## 2016-01-26 LAB — COMPREHENSIVE METABOLIC PANEL
ALBUMIN: 4.9 g/dL (ref 3.5–5.0)
ALT: 18 U/L (ref 17–63)
ANION GAP: 18 — AB (ref 5–15)
AST: 18 U/L (ref 15–41)
Alkaline Phosphatase: 73 U/L (ref 38–126)
BILIRUBIN TOTAL: 0.6 mg/dL (ref 0.3–1.2)
BUN: 16 mg/dL (ref 6–20)
CO2: 19 mmol/L — ABNORMAL LOW (ref 22–32)
Calcium: 10.8 mg/dL — ABNORMAL HIGH (ref 8.9–10.3)
Chloride: 100 mmol/L — ABNORMAL LOW (ref 101–111)
Creatinine, Ser: 1.12 mg/dL (ref 0.61–1.24)
Glucose, Bld: 127 mg/dL — ABNORMAL HIGH (ref 65–99)
POTASSIUM: 3.6 mmol/L (ref 3.5–5.1)
Sodium: 137 mmol/L (ref 135–145)
TOTAL PROTEIN: 8.6 g/dL — AB (ref 6.5–8.1)

## 2016-01-26 LAB — TROPONIN I

## 2016-01-26 LAB — LIPASE, BLOOD: Lipase: 26 U/L (ref 11–51)

## 2016-01-26 MED ORDER — GI COCKTAIL ~~LOC~~
30.0000 mL | Freq: Once | ORAL | Status: AC
  Administered 2016-01-26: 30 mL via ORAL
  Filled 2016-01-26: qty 30

## 2016-01-26 MED ORDER — IOPAMIDOL (ISOVUE-300) INJECTION 61%
30.0000 mL | Freq: Once | INTRAVENOUS | Status: AC | PRN
  Administered 2016-01-26: 30 mL via ORAL

## 2016-01-26 MED ORDER — MORPHINE SULFATE (PF) 4 MG/ML IV SOLN
4.0000 mg | Freq: Once | INTRAVENOUS | Status: AC
  Administered 2016-01-26: 4 mg via INTRAVENOUS
  Filled 2016-01-26: qty 1

## 2016-01-26 MED ORDER — GI COCKTAIL ~~LOC~~: 30.0000 mL | Freq: Two times a day (BID) | ORAL | 0 refills | Status: DC | PRN

## 2016-01-26 MED ORDER — SODIUM CHLORIDE 0.9 % IV BOLUS (SEPSIS)
1000.0000 mL | Freq: Once | INTRAVENOUS | Status: AC
  Administered 2016-01-26: 1000 mL via INTRAVENOUS

## 2016-01-26 MED ORDER — ONDANSETRON HCL 4 MG/2ML IJ SOLN
4.0000 mg | Freq: Once | INTRAMUSCULAR | Status: AC
  Administered 2016-01-26: 4 mg via INTRAVENOUS
  Filled 2016-01-26: qty 2

## 2016-01-26 NOTE — ED Notes (Signed)
In at RN request to perform EKG 

## 2016-01-26 NOTE — ED Triage Notes (Signed)
Pt c/o of RLQ abdominal pain x2 weeks, N/V, denies diarrhea. Pt reports he was diagnosed with a "tear in his esophagus x2 weeks" and has not been able to keep food/liquids since.

## 2016-01-26 NOTE — ED Provider Notes (Signed)
[facility] Emergency Department Provider Note  ____________________________________________   First MD Initiated Contact with Patient 01/26/16 2112     (approximate)  I have reviewed the triage vital signs and the nursing notes.   HISTORY  Chief Complaint Abdominal Pain   HPI [name] is a 56 y.o. male with a history of erosive esophagitis as well as nonbleeding esophageal ulcers who is presenting emergency department with burning chest pain as well as right lower quadrant abdominal pain. He says the pain has been ongoing since before he was admitted to the hospital in late December here. He says that since being discharged 2 weeks ago he has had return of his pain. He says that he is unable to eat or drink anything without extreme burning to his chest as well as right lower quadrant. He says that he has not had any GI cocktail at home because they gave him an incorrect prescription and was unable to fill it at the pharmacy upon discharge. He says that he is compliant with his sucralfate as well as Protonix. Also with nonbloody vomitus without any diarrhea.   Past Medical History:  Diagnosis Date  . Anxiety   . Arthritis    knees and hands  . Bipolar disorder (HCC)   . Depression   . GERD (gastroesophageal reflux disease)   . Hepatitis    HEP "C"  . Hypertension   . Kidney stones   . Pericarditis 05/2015   a. echo 5/17: EF 60-65%, no RWMA, LV dias fxn nl, LA mildly dilated, RV sys fxn nl, PASP nl, moderate sized circumferential pericardial effusion was identified, 2.12 cm around the LV free wall, <1 cm around the RV free wall. Features were not c/w tamponade physiology  . PTSD (post-traumatic stress disorder)    Witnessed brother's suicide.  Marland Kitchen Restless leg syndrome   . Syncope     Patient Active Problem List   Diagnosis Date Noted  . History of esophagogastroduodenoscopy (EGD) 01/12/2016  . Esophagitis 01/12/2016  . Hypokalemia 01/12/2016    . Leukocytosis 01/12/2016  . Nausea and vomiting 01/11/2016  . Odynophagia 01/11/2016  . Abdominal pain 01/06/2016  . Anemia 06/02/2015  . Chest pain with moderate risk for cardiac etiology   . Coronary artery disease involving native coronary artery of native heart with angina pectoris (HCC)   . Hyperlipidemia   . Pleuritic chest pain 06/01/2015  . Pericarditis with effusion 05/13/2015  . Chest pain at rest 05/12/2015  . Dyspnea 05/12/2015  . SIRS (systemic inflammatory response syndrome) (HCC) 05/12/2015  . 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 CYSTOSCOPY WITH URETEROSCOPY AND STENT PLACEMENT    . ESOPHAGOGASTRODUODENOSCOPY N/A 01/11/2016   Procedure: ESOPHAGOGASTRODUODENOSCOPY (EGD);  Surgeon: Charlott Rakes, MD;  Location: [facility];  Service: Endoscopy;  Laterality: N/A;  . JOINT REPLACEMENT    . KNEE ARTHROSCOPY Right 06/25/2014   Procedure: ARTHROSCOPY KNEE;  Surgeon: Deeann Saint, MD;  Location: [facility] ORS;  Service: Orthopedics;  Laterality: Right;  partial arthroscopic medial menisectomy  . TOTAL KNEE ARTHROPLASTY Right 04/22/2015   Procedure: TOTAL KNEE ARTHROPLASTY;  Surgeon: Deeann Saint, MD;  Location: [facility] ORS;  Service: Orthopedics;  Laterality: Right;    Prior to Admission medications   Medication Sig Start Date End Date Taking? Authorizing Provider  Alum & Mag Hydroxide-Simeth (GI COCKTAIL) SUSP suspension Take 30 mLs by mouth 3 (three) times daily as needed for indigestion. Shake well. 01/12/16  Katharina Caper, MD  benztropine (COGENTIN) 0.5 MG tablet Take 1 tablet (0.5 mg total) by mouth 2 (two) times daily as needed for tremors. 04/25/15   Deeann Saint, MD  divalproex (DEPAKOTE) 500 MG DR tablet Take 500 mg by mouth 2 (two) times daily.    Historical Provider, MD  enalapril (VASOTEC) 10 MG tablet Take 10 mg by mouth daily.    Historical Provider, MD  FLUoxetine (PROZAC) 20 MG capsule Take 40 mg by  mouth daily.     Historical Provider, MD  gabapentin (NEURONTIN) 400 MG capsule Take 1 capsule (400 mg total) by mouth 3 (three) times daily. 04/25/15   Deeann Saint, MD  levofloxacin (LEVAQUIN) 500 MG tablet Take 1 tablet (500 mg total) by mouth daily. X 6 more days Patient not taking: Reported on 01/06/2016 06/02/15   Enid Baas, MD  methocarbamol (ROBAXIN) 500 MG tablet Take 1 tablet (500 mg total) by mouth every 6 (six) hours as needed for muscle spasms. 04/25/15   Deeann Saint, MD  pantoprazole (PROTONIX) 40 MG tablet Take 1 tablet (40 mg total) by mouth 2 (two) times daily. 06/02/15   Enid Baas, MD  QUEtiapine (SEROQUEL) 50 MG tablet Take 50 mg by mouth 4 (four) times daily as needed (for agitation).     Historical Provider, MD  rosuvastatin (CRESTOR) 10 MG tablet Take 1 tablet (10 mg total) by mouth daily. 06/02/15   Enid Baas, MD  SEROQUEL XR 400 MG 24 hr tablet Take 400 mg by mouth at bedtime.     Historical Provider, MD  sucralfate (CARAFATE) 1 g tablet Take 1 tablet (1 g total) by mouth 4 (four) times daily -  with meals and at bedtime. 01/12/16   Katharina Caper, MD  tamsulosin (FLOMAX) 0.4 MG CAPS capsule Take 0.4 mg by mouth daily after breakfast.    Historical Provider, MD    Allergies Toradol [ketorolac tromethamine]  Family History  Problem Relation Age of Onset  . CVA Mother     deceased at age 48  . Depression Brother     Died by suicide at age 71    Social History Social History  Substance Use Topics  . Smoking status: Former Smoker    Packs/day: 1.00    Types: Cigarettes    Quit date: 05/16/1984  . Smokeless tobacco: Never Used  . Alcohol use Not on file    Review of Systems Constitutional: No fever/chills Eyes: No visual changes. ENT: No sore throat. Cardiovascular: as above. Respiratory: Denies shortness of breath. Gastrointestinal:    No diarrhea.  No constipation. Genitourinary: Negative for dysuria. Musculoskeletal: Negative for back  pain. Skin: Negative for rash. Neurological: Negative for headaches, focal weakness or numbness.  10-point ROS otherwise negative.  ____________________________________________   PHYSICAL EXAM:  VITAL SIGNS: ED Triage Vitals  Enc Vitals Group     BP 01/26/16 1927 130/89     Pulse Rate 01/26/16 1927 (!) 110     Resp 01/26/16 1927 17     Temp 01/26/16 1927 97.7 F (36.5 C)     Temp Source 01/26/16 1927 Oral     SpO2 01/26/16 1927 98 %     Weight 01/26/16 1928 173 lb (78.5 kg)     Height 01/26/16 1928 6' (1.829 m)     Head Circumference --      Peak Flow --      Pain Score 01/26/16 2122 5     Pain Loc --      Pain Edu? --  Excl. in GC? --     Constitutional: Alert and oriented. Well appearing and in no acute distress. Eyes: Conjunctivae are normal. PERRL. EOMI. Head: Atraumatic. Nose: No congestion/rhinnorhea. Mouth/Throat: Mucous membranes are moist.   Neck: No stridor.   Cardiovascular: Normal rate, regular rhythm. Grossly normal heart sounds.  Respiratory: Normal respiratory effort.  No retractions. Lungs CTAB. Gastrointestinal: Soft With minimal right lower quadrant tenderness palpation. No distention.  Musculoskeletal: No lower extremity tenderness nor edema.  No joint effusions. Neurologic:  Normal speech and language. No gross focal neurologic deficits are appreciated. Skin:  Skin is warm, dry and intact. No rash noted. Psychiatric: Mood and affect are normal. Speech and behavior are normal.  ____________________________________________   LABS (all labs ordered are listed, but only abnormal results are displayed)  Labs Reviewed  COMPREHENSIVE METABOLIC PANEL - Abnormal; Notable for the following:       Result Value   Chloride 100 (*)    CO2 19 (*)    Glucose, Bld 127 (*)    Calcium 10.8 (*)    Total Protein 8.6 (*)    Anion gap 18 (*)    All other components within normal limits  CBC - Abnormal; Notable for the following:    WBC 12.8 (*)    All  other components within normal limits  URINALYSIS, COMPLETE (UACMP) WITH MICROSCOPIC - Abnormal; Notable for the following:    Color, Urine YELLOW (*)    APPearance CLEAR (*)    Hgb urine dipstick MODERATE (*)    Squamous Epithelial / LPF 0-5 (*)    All other components within normal limits  LIPASE, BLOOD  TROPONIN I   ____________________________________________  EKG  ED ECG REPORT I, Arelia LongestSchaevitz,  Monserath Neff M, the attending physician, personally viewed and interpreted this ECG.   Date: 01/26/2016  EKG Time: 2218  Rate: 111  Rhythm: sinus tachycardia  Axis: normal axis  Intervals:none  ST&T Change: No ST segment elevation or depression. No abnormal T-wave inversion.  ____________________________________________  RADIOLOGY    DG Chest 1 View (Final result)  Result time 01/26/16 22:17:27  Final result by Corky SoxArthur Maynard, MD (01/26/16 22:17:27)           Narrative:   CLINICAL DATA: Pt c/o Chest pain and RLQ abdominal pain x2 weeks, N/V, denies diarrhea. Pt reports he was diagnosed with a "tear in his esophagus x2 weeks" and has not been able to keep food/liquids since.  EXAM: CHEST 1 VIEW  COMPARISON: Chest x-ray dated 06/01/2015.  FINDINGS: Heart size and mediastinal contours are normal. Lungs are clear. No evidence of pneumonia. No pleural effusion or pneumothorax seen. Osseous structures about the chest are unremarkable.  IMPRESSION: No active disease. No evidence of pneumonia or pulmonary edema.   Electronically Signed By: Bary RichardStan Maynard M.D. On: 01/26/2016 22:17          ____________________________________________   PROCEDURES  Procedure(s) performed:   Procedures  Critical Care performed:   ____________________________________________   INITIAL IMPRESSION / ASSESSMENT AND PLAN / ED COURSE  Pertinent labs & imaging results that were available during my care of the patient were reviewed by me and considered in my medical decision  making (see chart for details).   Clinical Course   ----------------------------------------- 10:58 PM on 01/26/2016 -----------------------------------------  Elevated white blood cell count. After GI cocktail the chest pain is gone but the right lower quadrant pain remains. He says that p.m. meds had also help right lower quadrant pain in the past. He says that  he still has his appendix. Because of his elevated white blood cell count as well as the right lower quadrant pain we will CAT scan him to rule out appendicitis. We will give further pain relief with morphine and also nausea relief with Zofran. Signed out to Dr. Dolores Frame. Chest pain likely related to his esophagitis. Unclear cause of his abdominal pain.   ____________________________________________   FINAL CLINICAL IMPRESSION(S) / ED DIAGNOSES  Right lower quadrant abdominal pain. Chest pain.    NEW MEDICATIONS STARTED DURING THIS VISIT:  New Prescriptions   No medications on file     Note:  This document was prepared using Dragon voice recognition software and may include unintentional dictation errors.    Myrna Blazer, MD 01/26/16 [phone]

## 2016-01-27 ENCOUNTER — Emergency Department: Payer: Medicaid Other

## 2016-01-27 ENCOUNTER — Encounter: Payer: Self-pay | Admitting: Radiology

## 2016-01-27 MED ORDER — DICYCLOMINE HCL 20 MG PO TABS: 20.0000 mg | ORAL_TABLET | Freq: Four times a day (QID) | ORAL | 0 refills | Status: DC | PRN

## 2016-01-27 MED ORDER — IOPAMIDOL (ISOVUE-300) INJECTION 61%
100.0000 mL | Freq: Once | INTRAVENOUS | Status: AC | PRN
  Administered 2016-01-27: 100 mL via INTRAVENOUS

## 2016-01-27 MED ORDER — DICYCLOMINE HCL 20 MG PO TABS
20.0000 mg | ORAL_TABLET | Freq: Once | ORAL | Status: AC
  Administered 2016-01-27: 20 mg via ORAL
  Filled 2016-01-27: qty 1

## 2016-01-27 MED ORDER — ONDANSETRON 4 MG PO TBDP: 4.0000 mg | ORAL_TABLET | Freq: Three times a day (TID) | ORAL | 0 refills | Status: DC | PRN

## 2016-01-27 NOTE — ED Provider Notes (Signed)
-----------------------------------------   1:48 AM on 01/27/2016 -----------------------------------------  CT abdomen and pelvis interpreted per Dr. Jake SamplesFujinaga: 1. No right lower quadrant inflammatory process is visualized.  Normal appendix.  2. Stable 7 mm intermediate density lesion in the right kidney as  previously described, nonemergent MRI evaluation was previously  recommended   Updated patient on CT imaging results. Prescriptions for Bentyl and Zofran provided. Strict return precautions given. Patient verbalizes understanding and agrees with plan of care.   Irean HongJade J Braeden Dolinski, MD 01/27/16 [phone]

## 2016-01-27 NOTE — Discharge Instructions (Signed)
1. You may take GI cocktail as needed for discomfort. 2. You may take Bentyl and Zofran as needed for abdominal discomfort and nausea. 3. Return to the ER for worsening symptoms, persistent vomiting, difficulty breathing or other concerns.

## 2016-02-08 ENCOUNTER — Ambulatory Visit: Payer: Medicaid Other | Admitting: Gastroenterology

## 2016-12-08 ENCOUNTER — Other Ambulatory Visit: Payer: Self-pay | Admitting: Specialist

## 2016-12-14 ENCOUNTER — Inpatient Hospital Stay: Admission: RE | Admit: 2016-12-14 | Payer: Self-pay | Source: Ambulatory Visit

## 2016-12-28 ENCOUNTER — Inpatient Hospital Stay: Admit: 2016-12-28 | Payer: Medicaid Other | Admitting: Specialist

## 2016-12-28 SURGERY — ARTHROPLASTY, KNEE, TOTAL
Anesthesia: Choice | Laterality: Left

## 2017-02-27 ENCOUNTER — Other Ambulatory Visit: Payer: Self-pay | Admitting: Specialist

## 2017-03-02 ENCOUNTER — Inpatient Hospital Stay: Admission: RE | Admit: 2017-03-02 | Payer: Medicaid Other | Source: Ambulatory Visit

## 2017-03-06 ENCOUNTER — Inpatient Hospital Stay: Admission: RE | Admit: 2017-03-06 | Payer: Medicaid Other | Source: Ambulatory Visit

## 2017-03-09 ENCOUNTER — Inpatient Hospital Stay: Admission: RE | Admit: 2017-03-09 | Payer: Medicaid Other | Source: Ambulatory Visit

## 2017-03-15 ENCOUNTER — Inpatient Hospital Stay: Admission: RE | Admit: 2017-03-15 | Payer: Medicaid Other | Source: Ambulatory Visit | Admitting: Specialist

## 2017-03-15 ENCOUNTER — Encounter: Admission: RE | Payer: Self-pay | Source: Ambulatory Visit

## 2017-03-15 SURGERY — ARTHROPLASTY, KNEE, TOTAL
Anesthesia: Choice | Laterality: Left

## 2017-06-06 ENCOUNTER — Other Ambulatory Visit: Payer: Self-pay | Admitting: Specialist

## 2017-06-13 ENCOUNTER — Inpatient Hospital Stay: Admission: RE | Admit: 2017-06-13 | Payer: Medicaid Other | Source: Ambulatory Visit

## 2017-06-28 ENCOUNTER — Inpatient Hospital Stay: Admit: 2017-06-28 | Payer: Medicaid Other | Admitting: Specialist

## 2017-06-28 SURGERY — ARTHROPLASTY, KNEE, TOTAL
Anesthesia: Choice | Laterality: Left

## 2017-10-13 ENCOUNTER — Other Ambulatory Visit: Payer: Self-pay | Admitting: Specialist

## 2017-10-16 ENCOUNTER — Inpatient Hospital Stay: Admission: RE | Admit: 2017-10-16 | Payer: Medicaid Other | Source: Ambulatory Visit

## 2017-10-26 ENCOUNTER — Encounter
Admission: RE | Admit: 2017-10-26 | Discharge: 2017-10-26 | Disposition: A | Discharge status: Home / Self Care | Payer: Medicaid Other | Source: Ambulatory Visit | Attending: Specialist | Admitting: Specialist

## 2017-10-26 ENCOUNTER — Other Ambulatory Visit: Payer: Self-pay

## 2017-10-26 DIAGNOSIS — Z01818 Encounter for other preprocedural examination: Secondary | ICD-10-CM | POA: Insufficient documentation

## 2017-10-26 DIAGNOSIS — M1712 Unilateral primary osteoarthritis, left knee: Secondary | ICD-10-CM | POA: Insufficient documentation

## 2017-10-26 HISTORY — DX: Personal history of urinary calculi: Z87.442

## 2017-10-26 LAB — URINALYSIS, ROUTINE W REFLEX MICROSCOPIC
Bilirubin Urine: NEGATIVE
GLUCOSE, UA: NEGATIVE mg/dL
Hgb urine dipstick: NEGATIVE
Ketones, ur: NEGATIVE mg/dL
Leukocytes, UA: NEGATIVE
Nitrite: NEGATIVE
Protein, ur: NEGATIVE mg/dL
SPECIFIC GRAVITY, URINE: 1 — AB (ref 1.005–1.030)
pH: 7 (ref 5.0–8.0)

## 2017-10-26 LAB — TYPE AND SCREEN
ABO/RH(D): O POS
ANTIBODY SCREEN: NEGATIVE

## 2017-10-26 LAB — SURGICAL PCR SCREEN
MRSA, PCR: NEGATIVE
STAPHYLOCOCCUS AUREUS: POSITIVE — AB

## 2017-10-26 LAB — PROTIME-INR
INR: 1.06
Prothrombin Time: 13.7 seconds (ref 11.4–15.2)

## 2017-10-26 NOTE — Pre-Procedure Instructions (Signed)
Patient became concerned when he was notified he needed to collect a urine specimen in PAT. Stated he usually is not able to pee on demand in a hospital setting. He was given 2 bottles of water to drink during PAT interview. Following interview patient was taken to restroom and given instruction how to collect urine sample. Lab tech brought sample to me. Sample was warm and clear and colorless. Sent sample to lab.

## 2017-10-26 NOTE — Patient Instructions (Signed)
Your procedure is scheduled on: Mon 10/30/17 Report to [facility] ON 2ND FLOOR MEDICAL MALL ENTRANCE. To find out your arrival time please call [phone] between 1PM - 3PM on Fri 10/27/17.  Remember: Instructions that are not followed completely may result in serious medical risk, up to and including death, or upon the discretion of your surgeon and anesthesiologist your surgery may need to be rescheduled.     _X__ 1. Do not eat food after midnight the night before your procedure.                 No gum chewing or hard candies. You may drink clear liquids up to 2 hours                 before you are scheduled to arrive for your surgery- DO not drink clear                 liquids within 2 hours of the start of your surgery.                 Clear Liquids include:  water, apple juice without pulp, clear carbohydrate                 drink such as Clearfast or Gatorade, Black Coffee or Tea (Do not add                 anything to coffee or tea).  __X__2.  On the morning of surgery brush your teeth with toothpaste and water, you                 may rinse your mouth with mouthwash if you wish.  Do not swallow any              toothpaste of mouthwash.     _X__ 3.  No Alcohol for 24 hours before or after surgery.   _X__ 4.  Do Not Smoke or use e-cigarettes For 24 Hours Prior to Your Surgery.                 Do not use any chewable tobacco products for at least 6 hours prior to                 surgery.  ____  5.  Bring all medications with you on the day of surgery if instructed.   __X__  6.  Notify your doctor if there is any change in your medical condition      (cold, fever, infections).     Do not wear jewelry, make-up, hairpins, clips or nail polish. Do not wear lotions, powders, or perfumes.  Do not shave 48 hours prior to surgery. Men may shave face and neck. Do not bring valuables to the hospital.    [facility] is not responsible for any belongings or  valuables.  Contacts, dentures/partials or body piercings may not be worn into surgery. Bring a case for your contacts, glasses or hearing aids, a denture cup will be supplied. Leave your suitcase in the car. After surgery it may be brought to your room. For patients admitted to the hospital, discharge time is determined by your treatment team.   Patients discharged the day of surgery will not be allowed to drive home.   Please read over the following fact sheets that you were given:   MRSA Information  __X__ Take these medicines the morning of surgery with A SIP OF WATER:  1. divalproex (DEPAKOTE)  2. FLUoxetine (PROZAC)   3.   4.  5. QUEtiapine (SEROQUEL) if needed  6. benztropine (COGENTIN) if needed  ____ Fleet Enema (as directed)   __X__ Use CHG Soap/SAGE wipes as directed  ____ Use inhalers on the day of surgery  ____ Stop metformin/Janumet/Farxiga 2 days prior to surgery    ____ Take 1/2 of usual insulin dose the night before surgery. No insulin the morning          of surgery.   ____ Stop Blood Thinners Coumadin/Plavix/Xarelto/Pleta/Pradaxa/Eliquis/Effient/Aspirin  on   Or contact your Surgeon, Cardiologist or Medical Doctor regarding  ability to stop your blood thinners  __X__ Stop Anti-inflammatories 7 days before surgery such as Advil, Ibuprofen, Motrin,  BC or Goodies Powder, Naprosyn, Naproxen, Aleve, Aspirin    __X__ Stop all herbal supplements, fish oil or vitamin E until after surgery.    ____ Bring C-Pap to the hospital.

## 2017-10-27 NOTE — Pre-Procedure Instructions (Signed)
Patient became concerned when he was notified he needed to collect a urine specimen in PAT. Stated he usually is not able to pee on demand in a hospital setting. He was given 2 bottles of water to drink during PAT interview. Following interview patient was taken to restroom and given instruction how to collect urine sample. Lab tech brought sample to me. Sample was warm and clear and colorless. Sent sample to lab.  Results received. Message regarding UA and PCR results sent to Dr Hyacinth Meeker 10/26/17. Message noted as received 1637 on 10/26/17. No new orders.

## 2017-10-29 MED ORDER — TRANEXAMIC ACID-NACL 1000-0.7 MG/100ML-% IV SOLN
1000.0000 mg | INTRAVENOUS | Status: AC
  Administered 2017-10-30: 1000 mg via INTRAVENOUS
  Filled 2017-10-29: qty 100

## 2017-10-29 MED ORDER — VANCOMYCIN HCL IN DEXTROSE 1-5 GM/200ML-% IV SOLN
1000.0000 mg | INTRAVENOUS | Status: AC
  Administered 2017-10-30: 1000 mg via INTRAVENOUS

## 2017-10-30 ENCOUNTER — Inpatient Hospital Stay: Payer: Medicaid Other

## 2017-10-30 ENCOUNTER — Encounter: Payer: Self-pay | Admitting: *Deleted

## 2017-10-30 ENCOUNTER — Inpatient Hospital Stay
Admission: RE | Admit: 2017-10-30 | Discharge: 2017-11-03 | DRG: 470 | Disposition: A | Discharge status: Home / Self Care | Payer: Medicaid Other | Attending: Specialist | Admitting: Specialist

## 2017-10-30 ENCOUNTER — Ambulatory Visit: Payer: Medicaid Other | Admitting: Anesthesiology

## 2017-10-30 ENCOUNTER — Other Ambulatory Visit: Payer: Self-pay

## 2017-10-30 ENCOUNTER — Encounter
Admission: RE | Disposition: A | Discharge status: Home / Self Care | Payer: Self-pay | Source: Home / Self Care | Attending: Specialist

## 2017-10-30 DIAGNOSIS — Z23 Encounter for immunization: Secondary | ICD-10-CM | POA: Diagnosis not present

## 2017-10-30 DIAGNOSIS — G2581 Restless legs syndrome: Secondary | ICD-10-CM | POA: Diagnosis present

## 2017-10-30 DIAGNOSIS — E860 Dehydration: Secondary | ICD-10-CM | POA: Diagnosis present

## 2017-10-30 DIAGNOSIS — I10 Essential (primary) hypertension: Secondary | ICD-10-CM | POA: Diagnosis present

## 2017-10-30 DIAGNOSIS — F4310 Post-traumatic stress disorder, unspecified: Secondary | ICD-10-CM | POA: Diagnosis present

## 2017-10-30 DIAGNOSIS — F319 Bipolar disorder, unspecified: Secondary | ICD-10-CM | POA: Diagnosis present

## 2017-10-30 DIAGNOSIS — G629 Polyneuropathy, unspecified: Secondary | ICD-10-CM | POA: Diagnosis present

## 2017-10-30 DIAGNOSIS — Z79899 Other long term (current) drug therapy: Secondary | ICD-10-CM | POA: Diagnosis not present

## 2017-10-30 DIAGNOSIS — I951 Orthostatic hypotension: Secondary | ICD-10-CM | POA: Diagnosis not present

## 2017-10-30 DIAGNOSIS — Z7982 Long term (current) use of aspirin: Secondary | ICD-10-CM

## 2017-10-30 DIAGNOSIS — K219 Gastro-esophageal reflux disease without esophagitis: Secondary | ICD-10-CM | POA: Diagnosis present

## 2017-10-30 DIAGNOSIS — M1712 Unilateral primary osteoarthritis, left knee: Principal | ICD-10-CM | POA: Diagnosis present

## 2017-10-30 DIAGNOSIS — Z87891 Personal history of nicotine dependence: Secondary | ICD-10-CM

## 2017-10-30 DIAGNOSIS — Z96659 Presence of unspecified artificial knee joint: Secondary | ICD-10-CM

## 2017-10-30 DIAGNOSIS — I340 Nonrheumatic mitral (valve) insufficiency: Secondary | ICD-10-CM | POA: Diagnosis not present

## 2017-10-30 HISTORY — PX: TOTAL KNEE ARTHROPLASTY: SHX125

## 2017-10-30 LAB — CBC
HEMATOCRIT: 40.5 % (ref 39.0–52.0)
HEMOGLOBIN: 13.6 g/dL (ref 13.0–17.0)
MCH: 31.3 pg (ref 26.0–34.0)
MCHC: 33.6 g/dL (ref 30.0–36.0)
MCV: 93.1 fL (ref 80.0–100.0)
Platelets: 198 10*3/uL (ref 150–400)
RBC: 4.35 MIL/uL (ref 4.22–5.81)
RDW: 14 % (ref 11.5–15.5)
WBC: 7.8 10*3/uL (ref 4.0–10.5)
nRBC: 0 % (ref 0.0–0.2)

## 2017-10-30 LAB — ABO/RH: ABO/RH(D): O POS

## 2017-10-30 LAB — CREATININE, SERUM
Creatinine, Ser: 1.05 mg/dL (ref 0.61–1.24)
GFR calc Af Amer: >60 mL/min (ref >60)
GFR calc non Af Amer: >60 mL/min (ref >60)

## 2017-10-30 LAB — GLUCOSE, CAPILLARY: Glucose-Capillary: 106 mg/dL — ABNORMAL HIGH (ref 70–99)

## 2017-10-30 SURGERY — ARTHROPLASTY, KNEE, TOTAL
Anesthesia: Spinal | Site: Knee | Laterality: Left

## 2017-10-30 MED ORDER — BISACODYL 10 MG RE SUPP
10.0000 mg | Freq: Every day | RECTAL | Status: DC | PRN
  Administered 2017-11-01: 10 mg via RECTAL
  Filled 2017-10-30: qty 1

## 2017-10-30 MED ORDER — BUPIVACAINE LIPOSOME 1.3 % IJ SUSP
INTRAMUSCULAR | Status: AC
  Filled 2017-10-30: qty 20

## 2017-10-30 MED ORDER — BUPIVACAINE HCL (PF) 0.5 % IJ SOLN
INTRAMUSCULAR | Status: DC | PRN
  Administered 2017-10-30: 3 mL

## 2017-10-30 MED ORDER — FENTANYL CITRATE (PF) 100 MCG/2ML IJ SOLN: 25.0000 ug | INTRAMUSCULAR | Status: DC | PRN

## 2017-10-30 MED ORDER — LACTATED RINGERS IV SOLN
INTRAVENOUS | Status: DC
  Administered 2017-10-30: 07:00:00 via INTRAVENOUS

## 2017-10-30 MED ORDER — BUPIVACAINE HCL (PF) 0.25 % IJ SOLN
INTRAMUSCULAR | Status: AC
  Filled 2017-10-30: qty 30

## 2017-10-30 MED ORDER — GABAPENTIN 300 MG PO CAPS
ORAL_CAPSULE | ORAL | Status: AC
  Administered 2017-10-30: 300 mg via ORAL
  Filled 2017-10-30: qty 1

## 2017-10-30 MED ORDER — FENTANYL CITRATE (PF) 100 MCG/2ML IJ SOLN
INTRAMUSCULAR | Status: DC | PRN
  Administered 2017-10-30 (×2): 50 ug via INTRAVENOUS

## 2017-10-30 MED ORDER — LIDOCAINE HCL (PF) 2 % IJ SOLN
INTRAMUSCULAR | Status: AC
  Filled 2017-10-30: qty 10

## 2017-10-30 MED ORDER — SODIUM CHLORIDE 0.9 % IV SOLN
INTRAVENOUS | Status: DC | PRN
  Administered 2017-10-30: 60 mL

## 2017-10-30 MED ORDER — MORPHINE SULFATE (PF) 4 MG/ML IV SOLN
INTRAVENOUS | Status: AC
  Filled 2017-10-30: qty 1

## 2017-10-30 MED ORDER — CELECOXIB 200 MG PO CAPS: 200.0000 mg | ORAL_CAPSULE | ORAL | Status: DC

## 2017-10-30 MED ORDER — KETAMINE HCL 50 MG/ML IJ SOLN
INTRAMUSCULAR | Status: AC
  Filled 2017-10-30: qty 10

## 2017-10-30 MED ORDER — GABAPENTIN 300 MG PO CAPS
300.0000 mg | ORAL_CAPSULE | Freq: Three times a day (TID) | ORAL | Status: DC
  Administered 2017-10-30: 300 mg via ORAL
  Filled 2017-10-30 (×3): qty 1

## 2017-10-30 MED ORDER — FENTANYL CITRATE (PF) 100 MCG/2ML IJ SOLN
INTRAMUSCULAR | Status: AC
  Filled 2017-10-30: qty 2

## 2017-10-30 MED ORDER — MORPHINE SULFATE (PF) 2 MG/ML IV SOLN
1.0000 mg | INTRAVENOUS | Status: DC | PRN
  Administered 2017-10-30 – 2017-10-31 (×2): 1 mg via INTRAVENOUS
  Filled 2017-10-30 (×3): qty 1

## 2017-10-30 MED ORDER — PROPOFOL 10 MG/ML IV BOLUS
INTRAVENOUS | Status: AC
  Filled 2017-10-30: qty 20

## 2017-10-30 MED ORDER — SODIUM CHLORIDE FLUSH 0.9 % IV SOLN
INTRAVENOUS | Status: AC
  Administered 2017-10-30: 14:00:00
  Filled 2017-10-30: qty 10

## 2017-10-30 MED ORDER — FLUOXETINE HCL 20 MG PO CAPS
80.0000 mg | ORAL_CAPSULE | Freq: Every day | ORAL | Status: DC
  Administered 2017-10-30 – 2017-11-03 (×5): 80 mg via ORAL
  Filled 2017-10-30 (×5): qty 4

## 2017-10-30 MED ORDER — QUETIAPINE FUMARATE ER 200 MG PO TB24
400.0000 mg | ORAL_TABLET | Freq: Every day | ORAL | Status: DC
  Administered 2017-10-30 – 2017-11-02 (×4): 400 mg via ORAL
  Filled 2017-10-30 (×5): qty 2

## 2017-10-30 MED ORDER — DIVALPROEX SODIUM 500 MG PO DR TAB
500.0000 mg | DELAYED_RELEASE_TABLET | Freq: Two times a day (BID) | ORAL | Status: DC
  Administered 2017-10-30 – 2017-11-03 (×8): 500 mg via ORAL
  Filled 2017-10-30 (×9): qty 1

## 2017-10-30 MED ORDER — LIDOCAINE HCL (CARDIAC) PF 100 MG/5ML IV SOSY
PREFILLED_SYRINGE | INTRAVENOUS | Status: DC | PRN
  Administered 2017-10-30: 60 mg via INTRAVENOUS

## 2017-10-30 MED ORDER — ACETAMINOPHEN 325 MG PO TABS: 325.0000 mg | ORAL_TABLET | Freq: Four times a day (QID) | ORAL | Status: DC | PRN

## 2017-10-30 MED ORDER — PROPOFOL 500 MG/50ML IV EMUL
INTRAVENOUS | Status: DC | PRN
  Administered 2017-10-30: 150 ug/kg/min via INTRAVENOUS

## 2017-10-30 MED ORDER — QUETIAPINE FUMARATE 25 MG PO TABS: 50.0000 mg | ORAL_TABLET | Freq: Four times a day (QID) | ORAL | Status: DC | PRN

## 2017-10-30 MED ORDER — MENTHOL 3 MG MT LOZG
1.0000 | LOZENGE | OROMUCOSAL | Status: DC | PRN
  Filled 2017-10-30: qty 9

## 2017-10-30 MED ORDER — ONDANSETRON HCL 4 MG/2ML IJ SOLN: 4.0000 mg | Freq: Four times a day (QID) | INTRAMUSCULAR | Status: DC | PRN

## 2017-10-30 MED ORDER — HYDROMORPHONE HCL 1 MG/ML IJ SOLN
1.0000 mg | INTRAMUSCULAR | Status: DC | PRN
  Administered 2017-10-30: 1 mg via INTRAVENOUS
  Filled 2017-10-30: qty 1

## 2017-10-30 MED ORDER — ENOXAPARIN SODIUM 30 MG/0.3ML ~~LOC~~ SOLN
30.0000 mg | SUBCUTANEOUS | Status: DC
  Administered 2017-10-31 – 2017-11-03 (×4): 30 mg via SUBCUTANEOUS
  Filled 2017-10-30 (×4): qty 0.3

## 2017-10-30 MED ORDER — MIDAZOLAM HCL 5 MG/5ML IJ SOLN
INTRAMUSCULAR | Status: DC | PRN
  Administered 2017-10-30 (×2): 2 mg via INTRAVENOUS

## 2017-10-30 MED ORDER — INFLUENZA VAC SPLIT QUAD 0.5 ML IM SUSY
0.5000 mL | PREFILLED_SYRINGE | INTRAMUSCULAR | Status: AC
  Administered 2017-11-02: 0.5 mL via INTRAMUSCULAR
  Filled 2017-10-30: qty 0.5

## 2017-10-30 MED ORDER — GLYCOPYRROLATE 0.2 MG/ML IJ SOLN
INTRAMUSCULAR | Status: AC
  Filled 2017-10-30: qty 1

## 2017-10-30 MED ORDER — NEOMYCIN-POLYMYXIN B GU 40-200000 IR SOLN
Status: AC
  Filled 2017-10-30: qty 20

## 2017-10-30 MED ORDER — KETAMINE HCL 10 MG/ML IJ SOLN
INTRAMUSCULAR | Status: DC | PRN
  Administered 2017-10-30 (×2): 25 mg via INTRAVENOUS

## 2017-10-30 MED ORDER — TRANEXAMIC ACID-NACL 1000-0.7 MG/100ML-% IV SOLN
1000.0000 mg | Freq: Once | INTRAVENOUS | Status: AC
  Administered 2017-10-30: 1000 mg via INTRAVENOUS
  Filled 2017-10-30: qty 100

## 2017-10-30 MED ORDER — ROSUVASTATIN CALCIUM 10 MG PO TABS
10.0000 mg | ORAL_TABLET | Freq: Every day | ORAL | Status: DC
  Administered 2017-10-30 – 2017-11-03 (×5): 10 mg via ORAL
  Filled 2017-10-30 (×5): qty 1

## 2017-10-30 MED ORDER — ONDANSETRON HCL 4 MG PO TABS: 4.0000 mg | ORAL_TABLET | Freq: Four times a day (QID) | ORAL | Status: DC | PRN

## 2017-10-30 MED ORDER — METOCLOPRAMIDE HCL 10 MG PO TABS: 5.0000 mg | ORAL_TABLET | Freq: Three times a day (TID) | ORAL | Status: DC | PRN

## 2017-10-30 MED ORDER — DOCUSATE SODIUM 100 MG PO CAPS
100.0000 mg | ORAL_CAPSULE | Freq: Two times a day (BID) | ORAL | Status: DC
  Administered 2017-10-30 – 2017-11-03 (×8): 100 mg via ORAL
  Filled 2017-10-30 (×8): qty 1

## 2017-10-30 MED ORDER — GABAPENTIN 300 MG PO CAPS
300.0000 mg | ORAL_CAPSULE | ORAL | Status: AC
  Administered 2017-10-30: 300 mg via ORAL

## 2017-10-30 MED ORDER — EPHEDRINE SULFATE 50 MG/ML IJ SOLN
INTRAMUSCULAR | Status: AC
  Filled 2017-10-30: qty 1

## 2017-10-30 MED ORDER — CELECOXIB 200 MG PO CAPS
ORAL_CAPSULE | ORAL | Status: AC
  Administered 2017-10-30: 200 mg
  Filled 2017-10-30: qty 1

## 2017-10-30 MED ORDER — PANTOPRAZOLE SODIUM 40 MG PO TBEC
40.0000 mg | DELAYED_RELEASE_TABLET | Freq: Two times a day (BID) | ORAL | Status: DC
  Administered 2017-10-30 – 2017-11-03 (×8): 40 mg via ORAL
  Filled 2017-10-30 (×8): qty 1

## 2017-10-30 MED ORDER — MAGNESIUM HYDROXIDE 400 MG/5ML PO SUSP
30.0000 mL | Freq: Every day | ORAL | Status: DC | PRN
  Administered 2017-10-31: 30 mL via ORAL
  Filled 2017-10-30: qty 30

## 2017-10-30 MED ORDER — ZOLPIDEM TARTRATE 5 MG PO TABS: 10.0000 mg | ORAL_TABLET | Freq: Every evening | ORAL | Status: DC | PRN

## 2017-10-30 MED ORDER — FLEET ENEMA 7-19 GM/118ML RE ENEM: 1.0000 | ENEMA | Freq: Once | RECTAL | Status: DC | PRN

## 2017-10-30 MED ORDER — SODIUM CHLORIDE 0.9 % IJ SOLN
INTRAMUSCULAR | Status: AC
  Filled 2017-10-30: qty 50

## 2017-10-30 MED ORDER — METOCLOPRAMIDE HCL 5 MG/ML IJ SOLN: 5.0000 mg | Freq: Three times a day (TID) | INTRAMUSCULAR | Status: DC | PRN

## 2017-10-30 MED ORDER — DIPHENHYDRAMINE HCL 12.5 MG/5ML PO ELIX: 12.5000 mg | ORAL_SOLUTION | ORAL | Status: DC | PRN

## 2017-10-30 MED ORDER — PROPOFOL 500 MG/50ML IV EMUL
INTRAVENOUS | Status: AC
  Filled 2017-10-30: qty 100

## 2017-10-30 MED ORDER — HYDROCODONE-ACETAMINOPHEN 7.5-325 MG PO TABS
1.0000 | ORAL_TABLET | ORAL | Status: DC | PRN
  Administered 2017-10-30 – 2017-10-31 (×3): 2 via ORAL
  Filled 2017-10-30 (×3): qty 2

## 2017-10-30 MED ORDER — SUCRALFATE 1 G PO TABS
1.0000 g | ORAL_TABLET | Freq: Three times a day (TID) | ORAL | Status: DC
  Administered 2017-10-30 – 2017-11-03 (×15): 1 g via ORAL
  Filled 2017-10-30 (×14): qty 1

## 2017-10-30 MED ORDER — SODIUM CHLORIDE 0.45 % IV SOLN
INTRAVENOUS | Status: DC
  Administered 2017-10-30 – 2017-10-31 (×2): via INTRAVENOUS

## 2017-10-30 MED ORDER — VANCOMYCIN HCL IN DEXTROSE 1-5 GM/200ML-% IV SOLN
1000.0000 mg | Freq: Two times a day (BID) | INTRAVENOUS | Status: AC
  Administered 2017-10-30 – 2017-10-31 (×3): 1000 mg via INTRAVENOUS
  Filled 2017-10-30 (×3): qty 200

## 2017-10-30 MED ORDER — CHLORHEXIDINE GLUCONATE CLOTH 2 % EX PADS: 6.0000 | MEDICATED_PAD | Freq: Once | CUTANEOUS | Status: DC

## 2017-10-30 MED ORDER — PROPOFOL 10 MG/ML IV BOLUS
INTRAVENOUS | Status: DC | PRN
  Administered 2017-10-30: 50 mg via INTRAVENOUS
  Administered 2017-10-30: 30 mg via INTRAVENOUS

## 2017-10-30 MED ORDER — GABAPENTIN 400 MG PO CAPS
400.0000 mg | ORAL_CAPSULE | Freq: Three times a day (TID) | ORAL | Status: DC
  Administered 2017-10-30 – 2017-11-03 (×12): 400 mg via ORAL
  Filled 2017-10-30 (×14): qty 1

## 2017-10-30 MED ORDER — CELECOXIB 200 MG PO CAPS
200.0000 mg | ORAL_CAPSULE | Freq: Two times a day (BID) | ORAL | Status: DC
  Administered 2017-10-30 – 2017-11-03 (×8): 200 mg via ORAL
  Filled 2017-10-30 (×8): qty 1

## 2017-10-30 MED ORDER — ALUM & MAG HYDROXIDE-SIMETH 200-200-20 MG/5ML PO SUSP: 30.0000 mL | ORAL | Status: DC | PRN

## 2017-10-30 MED ORDER — FAMOTIDINE 20 MG PO TABS
ORAL_TABLET | ORAL | Status: AC
  Administered 2017-10-30: 20 mg via ORAL
  Filled 2017-10-30: qty 1

## 2017-10-30 MED ORDER — SODIUM CHLORIDE 0.9 % IJ SOLN
INTRAMUSCULAR | Status: DC | PRN
  Administered 2017-10-30: 30 mL

## 2017-10-30 MED ORDER — VANCOMYCIN HCL IN DEXTROSE 1-5 GM/200ML-% IV SOLN
INTRAVENOUS | Status: AC
  Administered 2017-10-30: 1000 mg via INTRAVENOUS
  Filled 2017-10-30: qty 200

## 2017-10-30 MED ORDER — HYDROCODONE-ACETAMINOPHEN 5-325 MG PO TABS
1.0000 | ORAL_TABLET | ORAL | Status: DC | PRN
  Administered 2017-10-30 – 2017-10-31 (×4): 2 via ORAL
  Administered 2017-11-01: 1 via ORAL
  Administered 2017-11-01 – 2017-11-02 (×3): 2 via ORAL
  Administered 2017-11-02 (×2): 1 via ORAL
  Filled 2017-10-30 (×2): qty 2
  Filled 2017-10-30: qty 1
  Filled 2017-10-30 (×3): qty 2
  Filled 2017-10-30: qty 1
  Filled 2017-10-30: qty 2
  Filled 2017-10-30: qty 1
  Filled 2017-10-30 (×2): qty 2

## 2017-10-30 MED ORDER — PHENOL 1.4 % MT LIQD
1.0000 | OROMUCOSAL | Status: DC | PRN
  Filled 2017-10-30: qty 177

## 2017-10-30 MED ORDER — METHOCARBAMOL 1000 MG/10ML IJ SOLN
500.0000 mg | Freq: Four times a day (QID) | INTRAVENOUS | Status: DC | PRN
  Filled 2017-10-30: qty 5

## 2017-10-30 MED ORDER — FAMOTIDINE 20 MG PO TABS
20.0000 mg | ORAL_TABLET | Freq: Once | ORAL | Status: AC
  Administered 2017-10-30: 20 mg via ORAL

## 2017-10-30 MED ORDER — MIDAZOLAM HCL 2 MG/2ML IJ SOLN
INTRAMUSCULAR | Status: AC
  Filled 2017-10-30: qty 2

## 2017-10-30 MED ORDER — PHENYLEPHRINE HCL 10 MG/ML IJ SOLN
INTRAMUSCULAR | Status: AC
  Filled 2017-10-30: qty 1

## 2017-10-30 MED ORDER — MORPHINE SULFATE (PF) 4 MG/ML IV SOLN
INTRAVENOUS | Status: DC | PRN
  Administered 2017-10-30: 4 mg via INTRAVENOUS

## 2017-10-30 MED ORDER — METHOCARBAMOL 500 MG PO TABS
500.0000 mg | ORAL_TABLET | Freq: Four times a day (QID) | ORAL | Status: DC | PRN
  Administered 2017-10-30 – 2017-11-03 (×8): 500 mg via ORAL
  Filled 2017-10-30 (×10): qty 1

## 2017-10-30 MED ORDER — FERROUS SULFATE 325 (65 FE) MG PO TABS
325.0000 mg | ORAL_TABLET | Freq: Three times a day (TID) | ORAL | Status: DC
  Administered 2017-10-30 – 2017-11-03 (×12): 325 mg via ORAL
  Filled 2017-10-30 (×9): qty 1

## 2017-10-30 MED ORDER — BUPIVACAINE HCL (PF) 0.25 % IJ SOLN
INTRAMUSCULAR | Status: DC | PRN
  Administered 2017-10-30: 30 mL

## 2017-10-30 MED ORDER — ENALAPRIL MALEATE 10 MG PO TABS
10.0000 mg | ORAL_TABLET | Freq: Every day | ORAL | Status: DC
  Administered 2017-10-31: 10 mg via ORAL
  Filled 2017-10-30 (×3): qty 1

## 2017-10-30 MED ORDER — ONDANSETRON HCL 4 MG/2ML IJ SOLN: 4.0000 mg | Freq: Once | INTRAMUSCULAR | Status: DC | PRN

## 2017-10-30 SURGICAL SUPPLY — 61 items
AUTOTRANSFUS HAS 1/8 (MISCELLANEOUS) ×3
BLADE DEBAKEY 8.0 (BLADE) ×4 IMPLANT
BLADE DEBAKEY 8.0MM (BLADE) ×2
BLADE SAGITTAL WIDE XTHICK NO (BLADE) ×3 IMPLANT
CANISTER SUCT 1200ML W/VALVE (MISCELLANEOUS) ×3 IMPLANT
CANISTER SUCT 3000ML PPV (MISCELLANEOUS) ×3 IMPLANT
CEMENT HV SMART SET (Cement) ×6 IMPLANT
CEMENT TIBIA MBT SIZE 4 (Knees) ×1 IMPLANT
CHLORAPREP W/TINT 26ML (MISCELLANEOUS) ×6 IMPLANT
COMP FEM CEM LRG LT LCS (Orthopedic Implant) ×3 IMPLANT
COMPONENT FEM CEM LRG LT LCS (Orthopedic Implant) ×1 IMPLANT
COOLER POLAR GLACIER W/PUMP (MISCELLANEOUS) ×3 IMPLANT
COVER WAND RF STERILE (DRAPES) ×3 IMPLANT
CUFF TOURN 24 STER (MISCELLANEOUS) IMPLANT
CUFF TOURN 30 STER DUAL PORT (MISCELLANEOUS) ×3 IMPLANT
DRAPE INCISE IOBAN 66X60 STRL (DRAPES) ×3 IMPLANT
DRAPE SHEET LG 3/4 BI-LAMINATE (DRAPES) ×6 IMPLANT
DRSG AQUACEL AG ADV 3.5X10 (GAUZE/BANDAGES/DRESSINGS) IMPLANT
DRSG AQUACEL AG ADV 3.5X14 (GAUZE/BANDAGES/DRESSINGS) ×3 IMPLANT
ELECT REM PT RETURN 9FT ADLT (ELECTROSURGICAL) ×3
ELECTRODE REM PT RTRN 9FT ADLT (ELECTROSURGICAL) ×1 IMPLANT
GLOVE BIO SURGEON STRL SZ7.5 (GLOVE) IMPLANT
GLOVE BIO SURGEON STRL SZ8 (GLOVE) IMPLANT
GLOVE BIOGEL PI IND STRL 8 (GLOVE) IMPLANT
GLOVE BIOGEL PI IND STRL 8.5 (GLOVE) IMPLANT
GLOVE BIOGEL PI INDICATOR 8 (GLOVE)
GLOVE BIOGEL PI INDICATOR 8.5 (GLOVE)
GLOVE INDICATOR 8.0 STRL GRN (GLOVE) ×3 IMPLANT
GLOVE SURG ORTHO 8.5 STRL (GLOVE) ×3 IMPLANT
GOWN STRL REUS W/ TWL LRG LVL4 (GOWN DISPOSABLE) ×1 IMPLANT
GOWN STRL REUS W/TWL LRG LVL4 (GOWN DISPOSABLE) ×5 IMPLANT
HOLDER FOLEY CATH W/STRAP (MISCELLANEOUS) ×3 IMPLANT
IMMBOLIZER KNEE 19 BLUE UNIV (SOFTGOODS) ×3 IMPLANT
INSERT TIB LCS RP LRG 10 (Knees) ×3 IMPLANT
KIT TURNOVER KIT A (KITS) ×3 IMPLANT
NEEDLE SPNL 20GX3.5 QUINCKE YW (NEEDLE) ×3 IMPLANT
NS IRRIG 1000ML POUR BTL (IV SOLUTION) ×3 IMPLANT
PACK TOTAL KNEE (MISCELLANEOUS) ×3 IMPLANT
PAD WRAPON POLAR KNEE (MISCELLANEOUS) ×1 IMPLANT
PEG PATELLA CEM MB COMP LG (Knees) ×3 IMPLANT
PULSAVAC PLUS IRRIG FAN TIP (DISPOSABLE) ×3
SOL .9 NS 3000ML IRR  AL (IV SOLUTION) ×2
SOL .9 NS 3000ML IRR UROMATIC (IV SOLUTION) ×1 IMPLANT
SPONGE DRAIN TRACH 4X4 STRL 2S (GAUZE/BANDAGES/DRESSINGS) ×3 IMPLANT
SPONGE LAP 18X18 RF (DISPOSABLE) IMPLANT
STAPLER SKIN PROX 35W (STAPLE) ×3 IMPLANT
SUCTION FRAZIER HANDLE 10FR (MISCELLANEOUS) ×2
SUCTION TUBE FRAZIER 10FR DISP (MISCELLANEOUS) ×1 IMPLANT
SUT BONE WAX W31G (SUTURE) ×3 IMPLANT
SUT DVC 2 QUILL PDO  T11 36X36 (SUTURE) ×2
SUT DVC 2 QUILL PDO T11 36X36 (SUTURE) ×1 IMPLANT
SUT QUILL PDO 0 36 36 VIOLET (SUTURE) ×3 IMPLANT
SYR 20CC LL (SYRINGE) ×9 IMPLANT
SYSTEM AUTOTRANSFUS DUAL TROCR (MISCELLANEOUS) ×1 IMPLANT
TAPE MICROFOAM 4IN (TAPE) ×3 IMPLANT
TIBIA MBT CEMENT SIZE 4 (Knees) ×3 IMPLANT
TIP FAN IRRIG PULSAVAC PLUS (DISPOSABLE) ×1 IMPLANT
TOWER CARTRIDGE SMART MIX (DISPOSABLE) ×3 IMPLANT
TRAY FOLEY MTR SLVR 16FR STAT (SET/KITS/TRAYS/PACK) ×3 IMPLANT
TUBE SUCT KAM VAC (TUBING) ×3 IMPLANT
WRAPON POLAR PAD KNEE (MISCELLANEOUS) ×3

## 2017-10-30 NOTE — Anesthesia Procedure Notes (Signed)
Spinal  Patient location during procedure: OR Start time: 10/30/2017 7:45 AM End time: 10/30/2017 7:56 AM Staffing Anesthesiologist: Gunnar Fusi, MD Resident/CRNA: Johnna Acosta, CRNA Performed: resident/CRNA  Preanesthetic Checklist Completed: patient identified, site marked, surgical consent, pre-op evaluation, timeout performed, IV checked, risks and benefits discussed and monitors and equipment checked Spinal Block Patient position: sitting Prep: ChloraPrep Patient monitoring: heart rate, continuous pulse ox, blood pressure and cardiac monitor Approach: midline Location: L3-4 Injection technique: single-shot Needle Needle type: Whitacre and Introducer  Needle gauge: 24 G Needle length: 9 cm Assessment Sensory level: T10 Additional Notes Negative paresthesia. Negative blood return. Positive free-flowing CSF. Expiration date of kit checked and confirmed. Patient tolerated procedure well, without complications.

## 2017-10-30 NOTE — Op Note (Signed)
DATE OF SURGERY:  10/30/2017 TIME: 10:45 AM  PATIENT NAME:  [name]   AGE: 57 y.o.    PRE-OPERATIVE DIAGNOSIS:  osteoarthritis of left knee  POST-OPERATIVE DIAGNOSIS:  Same  PROCEDURE:  Procedure(s): TOTAL KNEE ARTHROPLASTY LEFT   SURGEON:  Rue Tinnel E, MD   ASSISTANT: ALEX MELOY, PAC  OPERATIVE IMPLANTS: Depuy LCS Femur/Patella size LARGE, Tibia size # 4,  Rotating platform polyethylene size 10  mm  Total tourniquet time was 112 minutes.  PREOPERATIVE INDICATIONS:  [name] is a 57 y.o. year old male with end stage bone on bone degenerative arthritis of the knee who failed conservative treatment, including injections, antiinflammatories, activity modification, and assistive devices, and had significant impairment of their activities of daily living, and elected for Total Knee Arthroplasty.   The risks, benefits, and alternatives were discussed at length including but not limited to the risks of infection, bleeding, nerve injury, stiffness, blood clots, the need for revision surgery, cardiopulmonary complications, among others, and they were willing to proceed.  OPERATIVE FINDINGS AND UNIQUE ASPECTS OF THE CASE:  Severe osteoarthritis  OPERATIVE DESCRIPTION:   The patient was brought to the operative room and placed in a supine position. Spinal anesthesia was administered. IV Vancomycin antibiotics were given. The lower extremity was prepped and draped in the usual sterile fashion. Time out was performed. The leg was elevated and exsanguinated and the tourniquet was inflated to 350 mmHg  An anterior midline incision was made.  Anterior quadriceps tendon splitting approach was performed. The patella was everted and osteophytes were removed. The anterior horn of the medial and lateral meniscus was removed.  Then the extramedullary tibial cutting jig was utilized making the appropriate cut using the anterior tibial crest as a reference building in appropriate posterior  slope. Care was taken during the cut to protect the medial and collateral ligaments. The proximal tibia was removed along with the posterior horns of the menisci. The PCL was sacrificed.  The distal femur was sized as above . Medial release was carried out. The anterior femoral cutting guide was aligned and centering hole made. The rotation guide was inserted and the anterior cutting guide pinned in place, and was in excellent alignment. The posterior femoral cuts were made. The flexion gap was established. The distal femoral cutting guide was introduced at 4 of valgus. This was pinned and the distal femoral cut made. The extension gap was established and was stable. The finishing guide was applied and finishing cuts made. The Mchale retractor was inserted and the keeled tibial trial was pinned in place. Centering hole was made and the keel inserted. The femoral component was inserted along with a polyethylene  insert and the knee articulated.  Extension and flexion showed good stability throughout. The patella was then sized and cut made for the patellar component. Centering holes were made. The trial was inserted and the knee articulated nicely with no need for lateral release. The trials were all removed and the knee thoroughly irrigated with pulsed lavage. Exparil was injected. The knee was dried and the cement mixed. The  keeled tibial component,  femoral component and patellar components were all cemented in place and excess cement was removed. The cement was allowed to harden for 10 minutes. Further irrigation was carried out. Bone wax was applied to all raw bony surfaces. Autovac drains were inserted. Quarter percent plain Marcaine, Toradol and morphine were injected. The capsule was closed with #2 Quill suture, and the subcutaneous tissues were closed  with 0 Quill suture. The skin was closed with staples.Sponge and needle counts were correct.  Aquacel dressing with TENS pads and a dry sterile dressing  were applied. Polar Care and knee immobilizer were applied. Tourniquet was deflated with excellent return of blood flow to foot. Patient was transferred to a hospital bed and taken to the recovery room in good condition.  Valinda Hoar, MD

## 2017-10-30 NOTE — Transfer of Care (Signed)
Immediate Anesthesia Transfer of Care Note  Patient: [name]  Procedure(s) Performed: TOTAL KNEE ARTHROPLASTY (Left Knee)  Patient Location: PACU  Anesthesia Type:Spinal  Level of Consciousness: awake, alert  and drowsy  Airway & Oxygen Therapy: Patient Spontanous Breathing and Patient connected to face mask oxygen  Post-op Assessment: Report given to RN and Post -op Vital signs reviewed and stable  Post vital signs: Reviewed and stable  Last Vitals:  Vitals Value Taken Time  BP 124/80 10/30/2017 10:55 AM  Temp    Pulse 58 10/30/2017 10:56 AM  Resp 14 10/30/2017 10:56 AM  SpO2 99 % 10/30/2017 10:56 AM  Vitals shown include unvalidated device data.  Last Pain:  Vitals:   10/30/17 0615  TempSrc: Tympanic  PainSc: 0-No pain         Complications: No apparent anesthesia complications

## 2017-10-30 NOTE — Anesthesia Post-op Follow-up Note (Signed)
Anesthesia QCDR form completed.        

## 2017-10-30 NOTE — NC FL2 (Signed)
[place] MEDICAID FL2 LEVEL OF CARE SCREENING TOOL     IDENTIFICATION  Patient Name: [name] Birthdate: Nov 06, 1960 Sex: male Admission Date (Current Location): 10/30/2017  [facility] and [place] Number:  [address] ([id number]) Facility and Address:  [facility], [address] [place], [place] [address]      Provider Number: [id number]  Attending Physician Name and Address:  Deeann Saint, MD  Relative Name and Phone Number:       Current Level of Care: Hospital Recommended Level of Care: Skilled Nursing Facility Prior Approval Number:    Date Approved/Denied:   PASRR Number: [id number])  Discharge Plan: SNF    Current Diagnoses: Patient Active Problem List   Diagnosis Date Noted  . History of esophagogastroduodenoscopy (EGD) 01/12/2016  . Esophagitis 01/12/2016  . Hypokalemia 01/12/2016  . Leukocytosis 01/12/2016  . Nausea and vomiting 01/11/2016  . Odynophagia 01/11/2016  . Abdominal pain 01/06/2016  . Anemia 06/02/2015  . Chest pain with moderate risk for cardiac etiology   . Coronary artery disease involving native coronary artery of native heart with angina pectoris (HCC)   . Hyperlipidemia   . Pleuritic chest pain 06/01/2015  . Pericarditis with effusion 05/13/2015  . Chest pain at rest 05/12/2015  . Dyspnea 05/12/2015  . SIRS (systemic inflammatory response syndrome) (HCC) 05/12/2015  . Total knee replacement status 04/22/2015    Orientation RESPIRATION BLADDER Height & Weight     Self, Time, Situation, Place  Normal Continent Weight:   Height:     BEHAVIORAL SYMPTOMS/MOOD NEUROLOGICAL BOWEL NUTRITION STATUS      Continent Diet(Diet: Regular )  AMBULATORY STATUS COMMUNICATION OF NEEDS Skin   Extensive Assist Verbally Surgical wounds(Incision: Left Knee. )                       Personal Care Assistance Level of Assistance  Bathing, Feeding, Dressing Bathing Assistance: Limited assistance Feeding  assistance: Independent Dressing Assistance: Limited assistance     Functional Limitations Info  Sight, Hearing, Speech Sight Info: Adequate Hearing Info: Adequate Speech Info: Adequate    SPECIAL CARE FACTORS FREQUENCY  PT (By licensed PT), OT (By licensed OT)     PT Frequency: (5) OT Frequency: (5)            Contractures      Additional Factors Info  Code Status, Allergies Code Status Info: (Full Code. ) Allergies Info: (Toradol Ketorolac Tromethamine)           Current Medications (10/30/2017):  This is the current hospital active medication list Current Facility-Administered Medications  Medication Dose Route Frequency Provider Last Rate Last Dose  . 0.45 % sodium chloride infusion   Intravenous Continuous Deeann Saint, MD 100 mL/hr at 10/30/17 1500    . [START ON 10/31/2017] acetaminophen (TYLENOL) tablet 325-650 mg  325-650 mg Oral Q6H PRN Deeann Saint, MD      . alum & mag hydroxide-simeth (MAALOX/MYLANTA) 200-200-20 MG/5ML suspension 30 mL  30 mL Oral Q4H PRN Deeann Saint, MD      . bisacodyl (DULCOLAX) suppository 10 mg  10 mg Rectal Daily PRN Deeann Saint, MD      . celecoxib (CELEBREX) capsule 200 mg  200 mg Oral BID Deeann Saint, MD      . diphenhydrAMINE (BENADRYL) 12.5 MG/5ML elixir 12.5-25 mg  12.5-25 mg Oral Q4H PRN Deeann Saint, MD      . divalproex (DEPAKOTE) DR tablet 500 mg  500 mg Oral BID Deeann Saint, MD      .  docusate sodium (COLACE) capsule 100 mg  100 mg Oral BID Deeann Saint, MD      . Melene Muller ON 10/31/2017] enalapril (VASOTEC) tablet 10 mg  10 mg Oral Daily Deeann Saint, MD      . Melene Muller ON 10/31/2017] enoxaparin (LOVENOX) injection 30 mg  30 mg Subcutaneous Q24H Deeann Saint, MD      . ferrous sulfate tablet 325 mg  325 mg Oral TID Sherilyn Cooter, MD   325 mg at 10/30/17 1358  . FLUoxetine (PROZAC) capsule 80 mg  80 mg Oral Daily Deeann Saint, MD   80 mg at 10/30/17 1358  . gabapentin (NEURONTIN) capsule 300 mg  300  mg Oral TID Deeann Saint, MD   Stopped at 10/30/17 1650  . gabapentin (NEURONTIN) capsule 400 mg  400 mg Oral TID Deeann Saint, MD   400 mg at 10/30/17 1645  . HYDROcodone-acetaminophen (NORCO) 7.5-325 MG per tablet 1-2 tablet  1-2 tablet Oral Q4H PRN Deeann Saint, MD   2 tablet at 10/30/17 1404  . HYDROcodone-acetaminophen (NORCO/VICODIN) 5-325 MG per tablet 1-2 tablet  1-2 tablet Oral Q4H PRN Deeann Saint, MD      . HYDROmorphone (DILAUDID) injection 1 mg  1 mg Intravenous Q2H PRN Deeann Saint, MD   1 mg at 10/30/17 1645  . magnesium hydroxide (MILK OF MAGNESIA) suspension 30 mL  30 mL Oral Daily PRN Deeann Saint, MD      . menthol-cetylpyridinium (CEPACOL) lozenge 3 mg  1 lozenge Oral PRN Deeann Saint, MD       Or  . phenol (CHLORASEPTIC) mouth spray 1 spray  1 spray Mouth/Throat PRN Deeann Saint, MD      . methocarbamol (ROBAXIN) tablet 500 mg  500 mg Oral Q6H PRN Deeann Saint, MD   500 mg at 10/30/17 1459   Or  . methocarbamol (ROBAXIN) 500 mg in dextrose 5 % 50 mL IVPB  500 mg Intravenous Q6H PRN Deeann Saint, MD      . metoCLOPramide (REGLAN) tablet 5-10 mg  5-10 mg Oral Q8H PRN Deeann Saint, MD       Or  . metoCLOPramide (REGLAN) injection 5-10 mg  5-10 mg Intravenous Q8H PRN Deeann Saint, MD      . morphine 2 MG/ML injection 1 mg  1 mg Intravenous Q2H PRN Deeann Saint, MD   1 mg at 10/30/17 1450  . ondansetron (ZOFRAN) tablet 4 mg  4 mg Oral Q6H PRN Deeann Saint, MD       Or  . ondansetron [facility]) injection 4 mg  4 mg Intravenous Q6H PRN Deeann Saint, MD      . pantoprazole (PROTONIX) EC tablet 40 mg  40 mg Oral BID Deeann Saint, MD      . QUEtiapine (SEROQUEL XR) 24 hr tablet 400 mg  400 mg Oral QHS Deeann Saint, MD      . QUEtiapine (SEROQUEL) tablet 50 mg  50 mg Oral QID PRN Deeann Saint, MD      . rosuvastatin (CRESTOR) tablet 10 mg  10 mg Oral Daily Deeann Saint, MD   10 mg at 10/30/17 1357  . sodium phosphate (FLEET) 7-19 GM/118ML enema 1  enema  1 enema Rectal Once PRN Deeann Saint, MD      . sucralfate (CARAFATE) tablet 1 g  1 g Oral TID WC & HS Deeann Saint, MD   1 g at 10/30/17 1645  . vancomycin (VANCOCIN) IVPB 1000 mg/200 mL premix  1,000 mg Intravenous Q12H Deeann Saint, MD      .  zolpidem (AMBIEN) tablet 10 mg  10 mg Oral QHS PRN Deeann Saint, MD         Discharge Medications: Please see discharge summary for a list of discharge medications.  Relevant Imaging Results:  Relevant Lab Results:   Additional Information (SSN: [id number])  Ryder Man, Darleen Crocker, LCSW

## 2017-10-30 NOTE — Anesthesia Procedure Notes (Signed)
Date/Time: 10/30/2017 8:00 AM Performed by: Ginger Carne, CRNA Pre-anesthesia Checklist: Patient identified, Emergency Drugs available, Suction available, Patient being monitored and Timeout performed Patient Re-evaluated:Patient Re-evaluated prior to induction Oxygen Delivery Method: Simple face mask Preoxygenation: Pre-oxygenation with 100% oxygen Induction Type: IV induction

## 2017-10-30 NOTE — Evaluation (Addendum)
Physical Therapy Evaluation [name] MRN: [id number] DOB: 03-16-1960 Today's Date: 10/30/2017   History of Present Illness  Pt is a 57 y/o M s/p L TKA.  Pt's PMH includes R TKA, PTSD, hepatitis, bipolar disorder, anxiety, syncope.   Clinical Impression  Pt is s/p L TKA resulting in the deficits listed below (see PT Problem List).  Mr. Stay is independent with all aspects of mobility at baseline.  His physical activity this session was limited by his elevated BP this session. BP taken in sitting reading 131/110, taken again after ~2 minutes reading 123/111.  Thus deferred additional exertional activity due to consistently high diastolic reading. BP 117/85 in supine. However, pt completed therapeutic exercises in supine and performed supine<>sit with min guard assist (pt slightly impulsive with this).  Pt says he did his own PT after R TKA ~2 years ago.  He told me that he will likely decline PT at d/c and will do PT on his own again.  Provided education on the benefits of formal PT and requested for pt to consider this. Pt will benefit from skilled PT to increase their independence and safety with mobility to allow discharge to the venue listed below.     Follow Up Recommendations Outpatient PT    Equipment Recommendations  None recommended by PT    Recommendations for Other Services       Precautions / Restrictions Precautions Precautions: Fall;Knee;Other (comment) Precaution Booklet Issued: No Precaution Comments: Instructed pt in no pillow under knee; monitor BP Required Braces or Orthoses: Knee Immobilizer - Left Knee Immobilizer - Left: ("when walking") Restrictions Weight Bearing Restrictions: Yes LLE Weight Bearing: Weight bearing as tolerated      Mobility  Bed Mobility Overal bed mobility: Needs Assistance Bed Mobility: Supine to Sit;Sit to Supine     Supine to sit: Min guard;HOB elevated Sit to supine: Min guard;HOB elevated   General bed  mobility comments: Pt impulsive at times with bed mobility, initiating movement before lines are arranged, despite cues to wait but pt does wait with reinforcement.  Pt requires increased time with cues for sequencing but does not require physical assist.    Transfers                 General transfer comment: Did not attempt this session due to elevated BP  Ambulation/Gait                Stairs            Wheelchair Mobility    Modified Rankin (Stroke Patients Only)       Balance Overall balance assessment: Needs assistance Sitting-balance support: No upper extremity supported;Feet supported Sitting balance-Leahy Scale: Fair Sitting balance - Comments: Pt able to sit EOB without UE support but would likely lose his balance with perturbation                                     Pertinent Vitals/Pain Pain Assessment: 0-10 Pain Score: 7  Pain Location: L knee Pain Descriptors / Indicators: Aching;Grimacing;Guarding;Moaning Pain Intervention(s): Limited activity within patient's tolerance;Monitored during session;Repositioned;Premedicated before session;Ice applied    Home Living Family/patient expects to be discharged to:: Private residence Living Arrangements: Spouse/significant other(girlfriend) Available Help at Discharge: Friend(s);Available 24 hours/day Type of Home: House Home Access: Stairs to enter Entrance Stairs-Rails: None Entrance Stairs-Number of Steps: 1 Home Layout: One level Home Equipment: Environmental consultant -  2 wheels;Cane - single point;Bedside commode      Prior Function Level of Independence: Independent         Comments: Pt ambulates without AD.  Denies any recent falls.  Is on disability.  Ind with ADLs.      Hand Dominance        Extremity/Trunk Assessment   Upper Extremity Assessment Upper Extremity Assessment: Overall WFL for tasks assessed    Lower Extremity Assessment Lower Extremity Assessment: LLE  deficits/detail(h/o R TKA) LLE Deficits / Details: limited assessment due to L TKA.  Pt able to perform SLR x10 without assist LLE: Unable to fully assess due to pain LLE Sensation: WNL       Communication   Communication: No difficulties  Cognition Arousal/Alertness: Awake/alert Behavior During Therapy: WFL for tasks assessed/performed(Although slightly impulsive at times) Overall Cognitive Status: Within Functional Limits for tasks assessed                                        General Comments General comments (skin integrity, edema, etc.): BP taken in sitting reading 131/110, taken again after ~2 minutes reading 123/111.  Thus deferred additional exertional activity due to consistently high diastolic reading. BP 117/85 in supine.   Pt says he did his own PT after R TKA ~2 years ago.  He told me that he will likely decline PT at d/c and will do PT on his own again.  Provided education on the benefits of formal PT and requested for pt to consider this.      Exercises Total Joint Exercises Ankle Circles/Pumps: AROM;Both;10 reps;Supine Quad Sets: Strengthening;Both;10 reps;Supine Heel Slides: AROM;AAROM;Left;5 reps;Supine Straight Leg Raises: Strengthening;Left;10 reps;Supine Goniometric ROM: ~(-2-90 deg)   Assessment/Plan    PT Assessment Patient needs continued PT services  PT Problem List Decreased strength;Decreased range of motion;Decreased activity tolerance;Decreased balance;Decreased knowledge of use of DME;Decreased safety awareness;Decreased knowledge of precautions;Pain       PT Treatment Interventions DME instruction;Gait training;Stair training;Functional mobility training;Therapeutic activities;Therapeutic exercise;Balance training;Neuromuscular re-education;Patient/family education;Wheelchair mobility training;Manual techniques;Modalities    PT Goals (Current goals can be found in the Care Plan section)  Acute Rehab PT Goals Patient Stated Goal: to  go home at d/c PT Goal Formulation: With patient Time For Goal Achievement: 11/13/17 Potential to Achieve Goals: Good    Frequency BID   Barriers to discharge        Co-evaluation               AM-PAC PT "6 Clicks" Daily Activity  Outcome Measure Difficulty turning over in bed (including adjusting bedclothes, sheets and blankets)?: A Lot Difficulty moving from lying on back to sitting on the side of the bed? : A Lot Difficulty sitting down on and standing up from a chair with arms (e.g., wheelchair, bedside commode, etc,.)?: A Lot Help needed moving to and from a bed to chair (including a wheelchair)?: A Little Help needed walking in hospital room?: A Lot Help needed climbing 3-5 steps with a railing? : A Lot 6 Click Score: 13    End of Session Equipment Utilized During Treatment: Left knee immobilizer Activity Tolerance: Treatment limited secondary to medical complications (Comment)(limited by elevated BP) Patient left: in bed;with call bell/phone within reach;with bed alarm set;with SCD's reapplied Nurse Communication: Mobility status;Other (comment)(BP readings) PT Visit Diagnosis: Pain;Other abnormalities of gait and mobility (R26.89);Muscle weakness (generalized) (M62.81) Pain - Right/Left: Left  Pain - part of body: Knee    Time: 1324-4010 PT Time Calculation (min) (ACUTE ONLY): 32 min   Charges:   PT Evaluation $PT Eval Moderate Complexity: 1 Mod PT Treatments $Therapeutic Exercise: 8-22 mins        Encarnacion Chu PT, DPT 10/30/2017, 4:11 PM

## 2017-10-30 NOTE — H&P (Signed)
THE PATIENT WAS SEEN PRIOR TO SURGERY TODAY.  HISTORY, ALLERGIES, HOME MEDICATIONS AND OPERATIVE PROCEDURE WERE REVIEWED. RISKS AND BENEFITS OF SURGERY DISCUSSED WITH PATIENT AGAIN.  NO CHANGES FROM INITIAL HISTORY AND PHYSICAL NOTED.    

## 2017-10-30 NOTE — Anesthesia Preprocedure Evaluation (Signed)
Anesthesia Evaluation  Patient identified by MRN, date of birth, ID band Patient awake    Reviewed: Allergy & Precautions, NPO status , Patient's Chart, lab work & pertinent test results  History of Anesthesia Complications Negative for: history of anesthetic complications  Airway Mallampati: II       Dental  (+) Partial Upper, Partial Lower   Pulmonary neg sleep apnea, neg COPD, former smoker,           Cardiovascular hypertension, Pt. on medications (-) Past MI and (-) CHF (-) dysrhythmias (-) Valvular Problems/Murmurs     Neuro/Psych neg Seizures Anxiety Depression Bipolar Disorder    GI/Hepatic GERD  ,(+) Hepatitis -, C  Endo/Other  neg diabetes  Renal/GU Renal disease (stones)     Musculoskeletal   Abdominal   Peds  Hematology  (+) anemia ,   Anesthesia Other Findings   Reproductive/Obstetrics                             Anesthesia Physical Anesthesia Plan  ASA: II  Anesthesia Plan: Spinal   Post-op Pain Management:    Induction:   PONV Risk Score and Plan:   Airway Management Planned:   Additional Equipment:   Intra-op Plan:   Post-operative Plan:   Informed Consent: I have reviewed the patients History and Physical, chart, labs and discussed the procedure including the risks, benefits and alternatives for the proposed anesthesia with the patient or authorized representative who has indicated his/her understanding and acceptance.     Plan Discussed with:   Anesthesia Plan Comments:         Anesthesia Quick Evaluation

## 2017-10-31 ENCOUNTER — Encounter: Payer: Self-pay | Admitting: Specialist

## 2017-10-31 LAB — BASIC METABOLIC PANEL
Anion gap: 6 (ref 5–15)
BUN: 11 mg/dL (ref 6–20)
CALCIUM: 8.6 mg/dL — AB (ref 8.9–10.3)
CO2: 23 mmol/L (ref 22–32)
CREATININE: 1.02 mg/dL (ref 0.61–1.24)
Chloride: 110 mmol/L (ref 98–111)
GFR calc Af Amer: >60 mL/min (ref >60)
GLUCOSE: 128 mg/dL — AB (ref 70–99)
Potassium: 3.9 mmol/L (ref 3.5–5.1)
Sodium: 139 mmol/L (ref 135–145)

## 2017-10-31 LAB — CBC
HCT: 36.2 % — ABNORMAL LOW (ref 39.0–52.0)
Hemoglobin: 12 g/dL — ABNORMAL LOW (ref 13.0–17.0)
MCH: 30.8 pg (ref 26.0–34.0)
MCHC: 33.1 g/dL (ref 30.0–36.0)
MCV: 92.8 fL (ref 80.0–100.0)
NRBC: 0 % (ref 0.0–0.2)
PLATELETS: 207 10*3/uL (ref 150–400)
RBC: 3.9 MIL/uL — ABNORMAL LOW (ref 4.22–5.81)
RDW: 13.8 % (ref 11.5–15.5)
WBC: 7.7 10*3/uL (ref 4.0–10.5)

## 2017-10-31 NOTE — Progress Notes (Signed)
Physical Therapy Treatment [name] MRN: [id number] DOB: 12-25-1960 Today's Date: 10/31/2017    History of Present Illness Pt is a 57 y/o M s/p L TKA.  Pt's PMH includes R TKA, PTSD, hepatitis, bipolar disorder, anxiety, syncope.     PT Comments    [name] made good progress with mobility this session.  Pt's L knee flexion ROM is limited this session due to pain and requires assist with heel slides with muscle guarding present.  ROM -2 to 64 deg.  Pt ambulated 140 ft with RW and requires cues for safe use of RW and for safety with transfers.  Pt will benefit from continued skilled PT services to increase functional independence and safety.    Follow Up Recommendations  Outpatient PT     Equipment Recommendations  None recommended by PT    Recommendations for Other Services       Precautions / Restrictions Precautions Precautions: Fall;Knee;Other (comment) Precaution Booklet Issued: No Precaution Comments: Reviewed no pillow under knee; monitor BP Required Braces or Orthoses: Knee Immobilizer - Left Knee Immobilizer - Left: ("when walking") Restrictions Weight Bearing Restrictions: Yes LLE Weight Bearing: Weight bearing as tolerated    Mobility  Bed Mobility Overal bed mobility: Needs Assistance Bed Mobility: Supine to Sit     Supine to sit: Min guard;HOB elevated     General bed mobility comments: Pt requires increased time and effort.    Transfers Overall transfer level: Needs assistance Equipment used: Rolling walker (2 wheeled) Transfers: Sit to/from Stand Sit to Stand: Min guard         General transfer comment: Pt requires cues for repositioning prior to sit>stand due to pt sitting turned in bed.  Pt requires cues for proper hand placement.  Cues to keep RW with him when backing up to chair to sit.   Ambulation/Gait Ambulation/Gait assistance: Min guard Gait Distance (Feet): 140 Feet Assistive device: Rolling walker (2  wheeled) Gait Pattern/deviations: Step-through pattern;Decreased stride length;Decreased stance time - left;Decreased weight shift to left Gait velocity: decreased   General Gait Details: Cues for upright posture and forward gaze and to keep RW rolling on the floor rather than lifting.     Stairs             Wheelchair Mobility    Modified Rankin (Stroke Patients Only)       Balance Overall balance assessment: Needs assistance Sitting-balance support: No upper extremity supported;Feet supported Sitting balance-[name] Scale: Fair Sitting balance - Comments: Pt able to sit EOB without UE support but would likely lose his balance with perturbation   Standing balance support: Bilateral upper extremity supported;During functional activity Standing balance-[name] Scale: Poor Standing balance comment: Pt relies on BUE support for static and dynamic activities                            Cognition Arousal/Alertness: Awake/alert Behavior During Therapy: WFL for tasks assessed/performed(Although slightly impulsive at times) Overall Cognitive Status: Within Functional Limits for tasks assessed                                        Exercises Total Joint Exercises Ankle Circles/Pumps: AROM;Both;10 reps;Supine Quad Sets: Strengthening;Both;10 reps;Supine Heel Slides: AAROM;Left;5 reps;Supine(with max assist at end range, limited by pain) Straight Leg Raises: Left;10 reps;Supine;AAROM Goniometric ROM: -2 to 64 deg  General Comments General comments (skin integrity, edema, etc.): BP taken in supine 165/93, in sitting EOB 157/99      Pertinent Vitals/Pain Pain Assessment: 0-10 Pain Score: 5  Pain Location: L knee Pain Descriptors / Indicators: Aching;Grimacing;Guarding;Moaning Pain Intervention(s): Limited activity within patient's tolerance;Monitored during session;Repositioned;Premedicated before session;Ice applied    Home Living                       Prior Function            PT Goals (current goals can now be found in the care plan section) Acute Rehab PT Goals Patient Stated Goal: dec pain; to go home at d/c PT Goal Formulation: With patient Time For Goal Achievement: 11/13/17 Potential to Achieve Goals: Good Progress towards PT goals: Progressing toward goals    Frequency    BID      PT Plan Current plan remains appropriate    Co-evaluation              AM-PAC PT "6 Clicks" Daily Activity  Outcome Measure  Difficulty turning over in bed (including adjusting bedclothes, sheets and blankets)?: A Lot Difficulty moving from lying on back to sitting on the side of the bed? : A Lot Difficulty sitting down on and standing up from a chair with arms (e.g., wheelchair, bedside commode, etc,.)?: A Lot Help needed moving to and from a bed to chair (including a wheelchair)?: A Little Help needed walking in hospital room?: A Little Help needed climbing 3-5 steps with a railing? : A Lot 6 Click Score: 14    End of Session Equipment Utilized During Treatment: Left knee immobilizer Activity Tolerance: Patient limited by pain Patient left: with call bell/phone within reach;in chair;with chair alarm set Nurse Communication: Mobility status;Other (comment)(BP readings) PT Visit Diagnosis: Pain;Other abnormalities of gait and mobility (R26.89);Muscle weakness (generalized) (M62.81) Pain - Right/Left: Left Pain - part of body: Knee     Time: 4098-1191 PT Time Calculation (min) (ACUTE ONLY): 35 min  Charges:  $Gait Training: 8-22 mins $Therapeutic Exercise: 8-22 mins                     Encarnacion Chu PT, DPT 10/31/2017, 12:44 PM

## 2017-10-31 NOTE — Anesthesia Postprocedure Evaluation (Signed)
Anesthesia Post Note  Patient: [name]  Procedure(s) Performed: TOTAL KNEE ARTHROPLASTY (Left Knee)  Patient location during evaluation: Nursing Unit Anesthesia Type: Spinal Level of consciousness: awake, awake and alert and oriented Pain management: pain level controlled Vital Signs Assessment: post-procedure vital signs reviewed and stable Respiratory status: spontaneous breathing, nonlabored ventilation and respiratory function stable Cardiovascular status: blood pressure returned to baseline and stable Postop Assessment: no headache and no backache Anesthetic complications: no     Last Vitals:  Vitals:   10/31/17 0433 10/31/17 0849  BP: (!) 153/97 (!) 145/92  Pulse: 79 70  Resp: 17 16  Temp: 37.1 C 37.1 C  SpO2: 96% 98%    Last Pain:  Vitals:   10/31/17 0849  TempSrc: Oral  PainSc: 5                  [employer]

## 2017-10-31 NOTE — Progress Notes (Signed)
Autovac output 500 mls. Pt come to the floor at around 12pm with Autovac in place. Dr. Odis Luster made aware. Ok to dispose the blood and convert to Hemovac.

## 2017-10-31 NOTE — Care Management (Signed)
Patient has a walker and cane at home.

## 2017-10-31 NOTE — Progress Notes (Signed)
Subjective: 1 Day Post-Op Procedure(s) (LRB): TOTAL KNEE ARTHROPLASTY (Left)  Alert and fairly comfortable.  Started PT.  Plans to go home tomorrow  Drain removed  Patient reports pain as mild.  Objective:   VITALS:   Vitals:   10/31/17 0433 10/31/17 0849  BP: (!) 153/97 (!) 145/92  Pulse: 79 70  Resp: 17 16  Temp: 98.7 F (37.1 C) 98.7 F (37.1 C)  SpO2: 96% 98%    Neurologically intact ABD soft Neurovascular intact Sensation intact distally Intact pulses distally Dorsiflexion/Plantar flexion intact Incision: dressing C/D/I  LABS Recent Labs    10/30/17 1345 10/31/17 0351  HGB 13.6 12.0*  HCT 40.5 36.2*  WBC 7.8 7.7  PLT 198 207    Recent Labs    10/30/17 1345 10/31/17 0351  NA  --  139  K  --  3.9  BUN  --  11  CREATININE 1.05 1.02  GLUCOSE  --  128*    No results for input(s): LABPT, INR in the last 72 hours.   Assessment/Plan: 1 Day Post-Op Procedure(s) (LRB): TOTAL KNEE ARTHROPLASTY (Left)   Home tomorrow. Plans to do his own PT

## 2017-10-31 NOTE — Progress Notes (Addendum)
Physical Therapy Treatment [name] MRN: [id number] DOB: November 25, 1960 Today's Date: 10/31/2017    History of Present Illness Pt is a 57 y/o M s/p L TKA.  Pt's PMH includes R TKA, PTSD, hepatitis, bipolar disorder, anxiety, syncope.     PT Comments    [name] L knee F ROM improved to 79 deg this session but pt continues to report high level of pain. Pt requires cues for safety with transfers and ambulation due to impulsivity and pt's overall general anxiety level.  Pt's BP remains elevated: BP taken in sitting EOB 149/108, taken again EOB 163/93, sitting at end of the session 155/98.  Pt asymptomatic. RN notified. Will complete stair training next date.    Follow Up Recommendations  Outpatient PT     Equipment Recommendations  None recommended by PT    Recommendations for Other Services       Precautions / Restrictions Precautions Precautions: Fall;Knee;Other (comment) Precaution Booklet Issued: No Precaution Comments: monitor BP Required Braces or Orthoses: Knee Immobilizer - Left Knee Immobilizer - Left: ("when walking") Restrictions Weight Bearing Restrictions: Yes LLE Weight Bearing: Weight bearing as tolerated    Mobility  Bed Mobility Overal bed mobility: Needs Assistance Bed Mobility: Supine to Sit;Sit to Supine     Supine to sit: Min guard;HOB elevated Sit to supine: Min guard   General bed mobility comments: Pt requires increased time and effort  Transfers Overall transfer level: Needs assistance Equipment used: Rolling walker (2 wheeled) Transfers: Sit to/from Stand Sit to Stand: Min guard         General transfer comment: Cues for proper hand placement for sit>stand and for positioning of LLE prior to sitting.   Ambulation/Gait Ambulation/Gait assistance: Min guard Gait Distance (Feet): 200 Feet Assistive device: Rolling walker (2 wheeled) Gait Pattern/deviations: Step-through pattern;Decreased stride  length;Decreased stance time - left;Decreased weight shift to left Gait velocity: decreased   General Gait Details: Cues for upright posture and forward gaze and to slow down as pt begins moving too quickly and demonstrating mild instability at times   Stairs             Wheelchair Mobility    Modified Rankin (Stroke Patients Only)       Balance Overall balance assessment: Needs assistance Sitting-balance support: No upper extremity supported;Feet supported Sitting balance-Leahy Scale: Good Sitting balance - Comments: Pt able to sit EOB without UE support but would likely lose his balance with perturbation   Standing balance support: Bilateral upper extremity supported;During functional activity Standing balance-Leahy Scale: Poor Standing balance comment: Pt relies on BUE support for static and dynamic activities                            Cognition Arousal/Alertness: Awake/alert Behavior During Therapy: WFL for tasks assessed/performed;Anxious(Although slightly impulsive at times; anxious about pain) Overall Cognitive Status: Within Functional Limits for tasks assessed                                        Exercises Total Joint Exercises Ankle Circles/Pumps: AROM;Both;10 reps;Supine Quad Sets: Strengthening;Both;10 reps;Supine Heel Slides: AAROM;Left;5 reps;Supine(with max assist at end range, limited by pain) Straight Leg Raises: Left;10 reps;Supine;AAROM Knee Flexion: AAROM;Left;5 reps;Seated;Other (comment)(with 5 second holds) Goniometric ROM: -5 to 79 deg    General Comments General comments (skin integrity, edema, etc.): BP  taken in sitting 149/108, taken again 163/93, sitting at end of the session 155/98      Pertinent Vitals/Pain Pain Assessment: Faces Pain Score: 5  Faces Pain Scale: Hurts whole lot Pain Location: L knee Pain Descriptors / Indicators: Aching;Grimacing;Guarding;Moaning Pain Intervention(s): Limited  activity within patient's tolerance;Monitored during session;Repositioned;Premedicated before session;Ice applied;Utilized relaxation techniques    Home Living                      Prior Function            PT Goals (current goals can now be found in the care plan section) Acute Rehab PT Goals Patient Stated Goal: dec pain; to go home at d/c PT Goal Formulation: With patient Time For Goal Achievement: 11/13/17 Potential to Achieve Goals: Good Progress towards PT goals: Progressing toward goals    Frequency    BID      PT Plan Current plan remains appropriate    Co-evaluation              AM-PAC PT "6 Clicks" Daily Activity  Outcome Measure  Difficulty turning over in bed (including adjusting bedclothes, sheets and blankets)?: A Little Difficulty moving from lying on back to sitting on the side of the bed? : A Little Difficulty sitting down on and standing up from a chair with arms (e.g., wheelchair, bedside commode, etc,.)?: A Little Help needed moving to and from a bed to chair (including a wheelchair)?: A Little Help needed walking in hospital room?: A Little Help needed climbing 3-5 steps with a railing? : A Little 6 Click Score: 18    End of Session Equipment Utilized During Treatment: Left knee immobilizer Activity Tolerance: Patient limited by pain Patient left: with call bell/phone within reach;in chair;with chair alarm set Nurse Communication: Mobility status;Other (comment)(BP readings) PT Visit Diagnosis: Pain;Other abnormalities of gait and mobility (R26.89);Muscle weakness (generalized) (M62.81) Pain - Right/Left: Left Pain - part of body: Knee     Time: 1610-9604 PT Time Calculation (min) (ACUTE ONLY): 32 min  Charges:  $Gait Training: 8-22 mins $Therapeutic Exercise: 8-22 mins                     Encarnacion Chu PT, DPT 10/31/2017, 3:04 PM

## 2017-10-31 NOTE — Progress Notes (Signed)
Clinical Social Worker (CSW) received SNF consult. PT is recommending outpatient PT. RN case manager aware of above. Please reconsult if future social work needs arise. CSW signing off.   Blayden Conwell, LCSW [phone]  

## 2017-11-01 LAB — CBC
HCT: 34.3 % — ABNORMAL LOW (ref 39.0–52.0)
HEMOGLOBIN: 11.6 g/dL — AB (ref 13.0–17.0)
MCH: 31 pg (ref 26.0–34.0)
MCHC: 33.8 g/dL (ref 30.0–36.0)
MCV: 91.7 fL (ref 80.0–100.0)
NRBC: 0 % (ref 0.0–0.2)
Platelets: 201 10*3/uL (ref 150–400)
RBC: 3.74 MIL/uL — AB (ref 4.22–5.81)
RDW: 13.7 % (ref 11.5–15.5)
WBC: 7.5 10*3/uL (ref 4.0–10.5)

## 2017-11-01 MED ORDER — SODIUM CHLORIDE 0.9 % IV BOLUS: 500.0000 mL | Freq: Once | INTRAVENOUS | Status: DC

## 2017-11-01 NOTE — Progress Notes (Signed)
Pt complaining of lowe abdominal pain. He reported that he feels like his bladder is full but cannot void. He said he has had the same issue before. Bladder scan done. 200 mls obtained. MD paged. Waiting for call back.

## 2017-11-01 NOTE — Progress Notes (Signed)
Subjective: 2 Days Post-Op Procedure(s) (LRB): TOTAL KNEE ARTHROPLASTY (Left)   Alert but had some orthostatic hypotension this AM.  Given IV fluids and he is taking p.o. fluids aggressively.  I have seen this in several patients after Exparel injections.  Systolic blood pressure  is 94 at latest check.  Patient reports pain as mild.  Objective:   VITALS:   Vitals:   11/01/17 1148 11/01/17 1215  BP: 92/64 (!) 94/57  Pulse: 99   Resp:    Temp:    SpO2: 97%     Neurologically intact ABD soft Neurovascular intact Sensation intact distally Intact pulses distally Dorsiflexion/Plantar flexion intact Incision: dressing C/D/I  LABS Recent Labs    10/30/17 1345 10/31/17 0351 11/01/17 0629  HGB 13.6 12.0* 11.6*  HCT 40.5 36.2* 34.3*  WBC 7.8 7.7 7.5  PLT 198 207 201    Recent Labs    10/30/17 1345 10/31/17 0351  NA  --  139  K  --  3.9  BUN  --  11  CREATININE 1.05 1.02  GLUCOSE  --  128*    No results for input(s): LABPT, INR in the last 72 hours.   Assessment/Plan: 2 Days Post-Op Procedure(s) (LRB): TOTAL KNEE ARTHROPLASTY (Left)   Advance diet  Continue pushing fluids. We will hold discharge to tomorrow. We will continue aggressive PT. Dressing change today.

## 2017-11-01 NOTE — Progress Notes (Signed)
Physical Therapy Treatment [name] MRN: [id number] DOB: 01/22/1960 Today's Date: 11/01/2017    History of Present Illness Pt is a 57 y/o M s/p L TKA.  Pt's PMH includes R TKA, PTSD, hepatitis, bipolar disorder, anxiety, syncope.     PT Comments    Mr. Petzold reports feeling better since this morning but during session reports headache radiating to R ear and neck as well as BP readings that are significantly lower than his baseline during this admission.  Thus, did not attempt OOB activity.  BP supine at start of session 104/63. Pt reports headache radiating to R ear and neck after first 2 exercises.  BP 103/68 supine.  BP taken again at end of session 106/70 supine.  Pulse ranges 93-98 during session.  SpO2 ranges 97-99% on RA during session. Pt demonstrated improved strength with SAQ and SLR this session.  Will attempt OOB activity next session if medically appropriate.    Follow Up Recommendations  Outpatient PT     Equipment Recommendations  None recommended by PT    Recommendations for Other Services       Precautions / Restrictions Precautions Precautions: Fall;Knee;Other (comment) Precaution Booklet Issued: Yes (comment)(exercise handout provided) Precaution Comments: monitor BP; HTN and orthostatic Required Braces or Orthoses: Knee Immobilizer - Left Knee Immobilizer - Left: ("when walking") Restrictions Weight Bearing Restrictions: Yes LLE Weight Bearing: Weight bearing as tolerated    Mobility  Bed Mobility               General bed mobility comments: Deferred this session as pt's BP remains significantly lower than his baseline since being in the hospital, along with pt's c/o headache radiating to his neck and R ear.   Transfers                    Ambulation/Gait                 Stairs             Wheelchair Mobility    Modified Rankin (Stroke Patients Only)       Balance                                             Cognition Arousal/Alertness: Awake/alert Behavior During Therapy: WFL for tasks assessed/performed Overall Cognitive Status: Within Functional Limits for tasks assessed                                        Exercises Total Joint Exercises Ankle Circles/Pumps: AROM;Both;10 reps;Supine Quad Sets: Strengthening;Both;10 reps;Supine Short Arc Quad: Strengthening;Left;20 reps;Supine;Other (comment)(10 reps on 1 pillow, 10 reps on 2 pillows) Heel Slides: AROM;Left;5 reps;Supine Hip ABduction/ADduction: AROM;Left;Supine;Strengthening;10 reps Straight Leg Raises: Left;Supine;Strengthening;AROM;10 reps    General Comments General comments (skin integrity, edema, etc.): BP supine at start of session 104/63. Pt reports headache radiating to R ear and neck after first 2 exercises.  BP 103/68 supine.  BP taken again at end of session 106/70 supine.  Pulse ranges 93-98 during session.  SpO2 ranges 97-99% on RA during session.       Pertinent Vitals/Pain Pain Assessment: 0-10 Pain Score: 6  Pain Location: L knee and headache in head, R ear, and neck Pain Descriptors / Indicators: Aching;Grimacing;Guarding;Moaning Pain  Intervention(s): Limited activity within patient's tolerance;Monitored during session;Ice applied    Home Living                      Prior Function            PT Goals (current goals can now be found in the care plan section) Acute Rehab PT Goals Patient Stated Goal: to feel better and go home PT Goal Formulation: With patient Time For Goal Achievement: 11/13/17 Potential to Achieve Goals: Good Progress towards PT goals: Not progressing toward goals - comment(limited by BP readings)    Frequency    BID      PT Plan Current plan remains appropriate    Co-evaluation              AM-PAC PT "6 Clicks" Daily Activity  Outcome Measure  Difficulty turning over in bed (including adjusting bedclothes,  sheets and blankets)?: A Little Difficulty moving from lying on back to sitting on the side of the bed? : A Little Difficulty sitting down on and standing up from a chair with arms (e.g., wheelchair, bedside commode, etc,.)?: A Little Help needed moving to and from a bed to chair (including a wheelchair)?: A Little Help needed walking in hospital room?: A Little Help needed climbing 3-5 steps with a railing? : A Little 6 Click Score: 18    End of Session   Activity Tolerance: Treatment limited secondary to medical complications (Comment)(BP readings) Patient left: in bed;with call bell/phone within reach;with bed alarm set;with nursing/sitter in room Nurse Communication: Mobility status;Other (comment)(vitals) PT Visit Diagnosis: Pain;Other abnormalities of gait and mobility (R26.89);Muscle weakness (generalized) (M62.81) Pain - Right/Left: Left Pain - part of body: Knee     Time: 1610-9604 PT Time Calculation (min) (ACUTE ONLY): 25 min  Charges:  $Therapeutic Exercise: 23-37 mins                     Encarnacion Chu PT, DPT 11/01/2017, 4:00 PM

## 2017-11-01 NOTE — Progress Notes (Signed)
Therapy called this RN into room to find patient's BP 73/61. Therapy put patient back into bed. MD was paged, no response. 500cc NS bolus started. Paged MD a second time, no response. BP 87/52 after first bolus completed. Called MD again. Received new orders to gave another 500cc NS bolus. After second bolus BP 82/54. Dressing is intact. Notified MD. New orders to give third bolus. Patient feeling 'tired', but better than this morning. Will continue to monitor.

## 2017-11-01 NOTE — Progress Notes (Signed)
Physical Therapy Treatment [name] MRN: [id number] DOB: 10-27-1960 Today's Date: 11/01/2017    History of Present Illness Pt is a 57 y/o M s/p L TKA.  Pt's PMH includes R TKA, PTSD, hepatitis, bipolar disorder, anxiety, syncope.     PT Comments    Pt completed therapeutic exercises in supine before transitioning to sitting EOB.   When pt transitioned from supine>sit the pt reported his heart racing along with L knee pain.  Vitals taken sitting EOB: pulse 130, SpO2 98%, unable to get BP reading before returned pt to supine as pt reported a throbbing in his L neck and says, "this is what happens before I have my seizures", pt reports he has these episodes 1-2x/wk and his last episode was on Saturday.  RN called and notified of pt's symptoms in sitting prior to moving to supine.  Vitals taken once supine: 122 pulse, 97% SpO2, BP 73/61. RN arrived and informed of vitals.  Pt placed in trendelenberg and vitals taken again: 79/51 BP, 120 pulse, 97% SpO2.  BP taken again several times with it improving to 86/47 before this PT left room.  RN to continue monitoring the pt. Pt reports feeling better.    Follow Up Recommendations  Outpatient PT     Equipment Recommendations  None recommended by PT    Recommendations for Other Services       Precautions / Restrictions Precautions Precautions: Fall;Knee;Other (comment) Precaution Booklet Issued: No Precaution Comments: monitor BP; HTN and orthostatic Required Braces or Orthoses: Knee Immobilizer - Left Knee Immobilizer - Left: ("when walking") Restrictions Weight Bearing Restrictions: Yes LLE Weight Bearing: Weight bearing as tolerated    Mobility  Bed Mobility Overal bed mobility: Needs Assistance Bed Mobility: Supine to Sit;Sit to Supine     Supine to sit: HOB elevated;Supervision Sit to supine: Supervision   General bed mobility comments: Pt requires increased time and effort but no physical assist or  cues  Transfers                 General transfer comment: Did not attempt  Ambulation/Gait                 Stairs             Wheelchair Mobility    Modified Rankin (Stroke Patients Only)       Balance Overall balance assessment: Needs assistance Sitting-balance support: No upper extremity supported;Feet supported Sitting balance-Leahy Scale: Good                                      Cognition Arousal/Alertness: Awake/alert Behavior During Therapy: WFL for tasks assessed/performed;Anxious(Although slightly impulsive at times; anxious about pain) Overall Cognitive Status: Within Functional Limits for tasks assessed                                        Exercises Total Joint Exercises Ankle Circles/Pumps: AROM;Both;10 reps;Supine Quad Sets: Strengthening;Both;10 reps;Supine Hip ABduction/ADduction: AROM;Left;5 reps;Supine Straight Leg Raises: Left;Supine;AAROM;5 reps    General Comments General comments (skin integrity, edema, etc.): When pt transitioned from supine>sit the pt reported his heart racing along with L knee pain.  Vitals taken sitting EOB: pulse 130, SpO2 98%, unable to get BP reading before returned pt to supine as pt reported a throbbing in  his L neck and says, "this is what happens before I have my seizures", pt reports he has these episodes 1-2x/wk and his last episode was on Saturday.  RN called and notified of pt's symptoms in sitting prior to moving to supine.  Vitals taken once supine: 122 pulse, 97% SpO2, BP 73/61. RN arrived and informed of vitals.  Pt placed in trendelenberg and vitals taken again: 79/51 BP, 120 pulse, 97% SpO2.  BP taken again several times with it improving to 86/47 before this PT left room.  RN to continue monitoring the pt. Pt reports feeling better.       Pertinent Vitals/Pain Pain Assessment: 0-10 Pain Score: 6  Pain Location: L knee Pain Descriptors / Indicators:  Aching;Grimacing;Guarding;Moaning Pain Intervention(s): Limited activity within patient's tolerance;Monitored during session;Repositioned;Premedicated before session;Ice applied    Home Living                      Prior Function            PT Goals (current goals can now be found in the care plan section) Acute Rehab PT Goals Patient Stated Goal: to feel better and go home PT Goal Formulation: With patient Time For Goal Achievement: 11/13/17 Potential to Achieve Goals: Good    Frequency    BID      PT Plan Current plan remains appropriate    Co-evaluation              AM-PAC PT "6 Clicks" Daily Activity  Outcome Measure  Difficulty turning over in bed (including adjusting bedclothes, sheets and blankets)?: A Little Difficulty moving from lying on back to sitting on the side of the bed? : A Little Difficulty sitting down on and standing up from a chair with arms (e.g., wheelchair, bedside commode, etc,.)?: A Little Help needed moving to and from a bed to chair (including a wheelchair)?: A Little Help needed walking in hospital room?: A Little Help needed climbing 3-5 steps with a railing? : A Little 6 Click Score: 18    End of Session   Activity Tolerance: Treatment limited secondary to medical complications (Comment)(BP readings) Patient left: in bed;with call bell/phone within reach;with bed alarm set Nurse Communication: Mobility status;Other (comment)(RN present once pt in supine and aware of vitals) PT Visit Diagnosis: Pain;Other abnormalities of gait and mobility (R26.89);Muscle weakness (generalized) (M62.81) Pain - Right/Left: Left Pain - part of body: Knee     Time: 0850-0929(pt only charged for time spent with therapeutic exercises) PT Time Calculation (min) (ACUTE ONLY): 39 min  Charges:  $Therapeutic Exercise: 8-22 mins                     Encarnacion Chu PT, DPT 11/01/2017, 9:42 AM

## 2017-11-01 NOTE — Progress Notes (Signed)
Pt was able to void at 0508. Reported feeling relieved.

## 2017-11-02 ENCOUNTER — Encounter: Payer: Self-pay | Admitting: Specialist

## 2017-11-02 LAB — CBC
HEMATOCRIT: 30.3 % — AB (ref 39.0–52.0)
Hemoglobin: 9.9 g/dL — ABNORMAL LOW (ref 13.0–17.0)
MCH: 30.7 pg (ref 26.0–34.0)
MCHC: 32.7 g/dL (ref 30.0–36.0)
MCV: 93.8 fL (ref 80.0–100.0)
NRBC: 0 % (ref 0.0–0.2)
Platelets: 206 10*3/uL (ref 150–400)
RBC: 3.23 MIL/uL — AB (ref 4.22–5.81)
RDW: 14.1 % (ref 11.5–15.5)
WBC: 5.4 10*3/uL (ref 4.0–10.5)

## 2017-11-02 MED ORDER — SODIUM CHLORIDE 0.9 % IV BOLUS
1000.0000 mL | Freq: Once | INTRAVENOUS | Status: AC
  Administered 2017-11-02: 1000 mL via INTRAVENOUS

## 2017-11-02 NOTE — Progress Notes (Signed)
Physical Therapy Treatment [name] MRN: [id number] DOB: 12-19-1960 Today's Date: 11/02/2017    History of Present Illness Pt is a 57 y/o M s/p L TKA.  Pt's PMH includes R TKA, PTSD, hepatitis, bipolar disorder, anxiety, syncope.     PT Comments    Mr. Winkowski remains limited by his low BP readings which were +orthostatic this session.  Pt symptomatic with standing lower BP, reporting pressure behind his eyes.  BP was monitored closely: taken in supine at start of session: 121/73, again in supine after exercises 123/69, sitting EOB 102/84, again in sitting after exercise 102/73, in standing at bedside 97/87 and pt reporting pressure behind his eyes thus had pt return to supine and BP taken again 99/66.  In supine after ~3 minutes of rest 106/70.    Follow Up Recommendations  Outpatient PT     Equipment Recommendations  None recommended by PT    Recommendations for Other Services       Precautions / Restrictions Precautions Precautions: Fall;Knee;Other (comment) Precaution Booklet Issued: (exercise handout previously provided) Precaution Comments: monitor BP; HTN and orthostatic Required Braces or Orthoses: Knee Immobilizer - Left Knee Immobilizer - Left: ("when walking") Restrictions Weight Bearing Restrictions: Yes LLE Weight Bearing: Weight bearing as tolerated    Mobility  Bed Mobility Overal bed mobility: Needs Assistance Bed Mobility: Supine to Sit;Sit to Supine     Supine to sit: Min guard;HOB elevated Sit to supine: Min guard   General bed mobility comments: Cues to move slowly from supine>sit to avoid BP drop.  Pt uses LUE to assist LLE to EOB.    Transfers Overall transfer level: Needs assistance Equipment used: Rolling walker (2 wheeled) Transfers: Sit to/from Stand Sit to Stand: Min guard         General transfer comment: Cues for proper hand placement.    Ambulation/Gait             General Gait Details: Not safe to  attempt at this time due to +orthostatics and pt reporting pressure behind his eyes   Stairs             Wheelchair Mobility    Modified Rankin (Stroke Patients Only)       Balance Overall balance assessment: Needs assistance Sitting-balance support: No upper extremity supported;Feet supported Sitting balance-Leahy Scale: Good                                      Cognition Arousal/Alertness: Awake/alert Behavior During Therapy: WFL for tasks assessed/performed Overall Cognitive Status: Within Functional Limits for tasks assessed                                        Exercises Total Joint Exercises Ankle Circles/Pumps: AROM;Both;10 reps;Supine Quad Sets: Strengthening;Both;10 reps;Supine Short Arc Quad: Strengthening;Left;Supine;10 reps Hip ABduction/ADduction: AROM;Left;Supine;Strengthening;10 reps Straight Leg Raises: Left;Supine;Strengthening;AROM;10 reps;AAROM Knee Flexion: AAROM;Left;5 reps;Seated;Other (comment)(with 5 second holds) Goniometric ROM: -4-76    General Comments General comments (skin integrity, edema, etc.): BP taken in supine at start of session: 121/73, again in supine after exercises 123/69, sitting EOB 102/84, again in sitting after exercise 102/73, in standing at bedside 97/87 and pt reporting pressure behind his eyes thus had pt return to supine and BP taken again 99/66.  In supine after ~3 minutes of  rest 106/70.       Pertinent Vitals/Pain Pain Assessment: Faces Faces Pain Scale: Hurts little more Pain Location: L knee(as well as pressure behind eyes in standing) Pain Descriptors / Indicators: Aching;Grimacing;Guarding;Pressure Pain Intervention(s): Limited activity within patient's tolerance;Monitored during session;Repositioned;Ice applied;Utilized relaxation techniques    Home Living                      Prior Function            PT Goals (current goals can now be found in the care plan  section) Acute Rehab PT Goals Patient Stated Goal: to feel better and go home PT Goal Formulation: With patient Time For Goal Achievement: 11/13/17 Potential to Achieve Goals: Good Progress towards PT goals: Not progressing toward goals - comment(due to BP readings and pt's symptomatic response)    Frequency    BID      PT Plan Current plan remains appropriate    Co-evaluation              AM-PAC PT "6 Clicks" Daily Activity  Outcome Measure  Difficulty turning over in bed (including adjusting bedclothes, sheets and blankets)?: A Little Difficulty moving from lying on back to sitting on the side of the bed? : A Little Difficulty sitting down on and standing up from a chair with arms (e.g., wheelchair, bedside commode, etc,.)?: A Little Help needed moving to and from a bed to chair (including a wheelchair)?: A Little Help needed walking in hospital room?: A Little Help needed climbing 3-5 steps with a railing? : A Little 6 Click Score: 18    End of Session Equipment Utilized During Treatment: Gait belt;Left knee immobilizer Activity Tolerance: Treatment limited secondary to medical complications (Comment)(BP readings and pt's reported symptoms) Patient left: in bed;with call bell/phone within reach;with bed alarm set;Other (comment)(with polar care on ) Nurse Communication: Mobility status;Other (comment)(BP readings) PT Visit Diagnosis: Pain;Other abnormalities of gait and mobility (R26.89);Muscle weakness (generalized) (M62.81) Pain - Right/Left: Left Pain - part of body: Knee     Time: 0827-0907 PT Time Calculation (min) (ACUTE ONLY): 40 min  Charges:  $Therapeutic Exercise: 23-37 mins $Therapeutic Activity: 8-22 mins                     Encarnacion Chu PT, DPT 11/02/2017, 11:33 AM

## 2017-11-02 NOTE — Progress Notes (Signed)
Subjective: 3 Days Post-Op Procedure(s) (LRB): TOTAL KNEE ARTHROPLASTY (Left)   Still having orthostatic hypotension when getting up.  Feels well otherwise.  Will get medical consult. Delay discharge another day.   Patient reports pain as mild.  Objective:   VITALS:   Vitals:   11/02/17 0016 11/02/17 0745  BP: 133/64 124/71  Pulse: 99 (!) 112  Resp: 19 18  Temp: 98.2 F (36.8 C) 98 F (36.7 C)  SpO2: 96% 98%    Neurologically intact ABD soft Neurovascular intact Sensation intact distally Intact pulses distally Dorsiflexion/Plantar flexion intact Incision: dressing C/D/I  LABS Recent Labs    10/31/17 0351 11/01/17 0629 11/02/17 0416  HGB 12.0* 11.6* 9.9*  HCT 36.2* 34.3* 30.3*  WBC 7.7 7.5 5.4  PLT 207 201 206    Recent Labs    10/30/17 1345 10/31/17 0351  NA  --  139  K  --  3.9  BUN  --  11  CREATININE 1.05 1.02  GLUCOSE  --  128*    No results for input(s): LABPT, INR in the last 72 hours.   Assessment/Plan: 3 Days Post-Op Procedure(s) (LRB): TOTAL KNEE ARTHROPLASTY (Left)   Up with therapy  Get medical consult Discharge tomorrow

## 2017-11-02 NOTE — Consult Note (Signed)
[employer] - [place] [facility]   PATIENT NAME: [name]    MR#:  [id number]  DATE OF BIRTH:  Jun 16, 1960  DATE OF CONSULT:  11/02/2017  PRIMARY CARE PHYSICIAN: [facility]   REQUESTING/REFERRING PHYSICIAN: Dr. Deeann Saint  CHIEF COMPLAINT:  No chief complaint on file. Hypotension  HISTORY OF PRESENT ILLNESS:  [name]  is a 57 y.o. male with a known history of bipolar disorder, PTSD, restless leg syndrome, hypertension, osteoarthritis, previous history of pericarditis who presents to the hospital for left total knee replacement.  Patient is postop day #2 from left knee replacement and was noted to be hypotensive and therefore hospitalist services were contacted for consultation.  Patient was feeling somewhat dizzy yesterday and orthostatic but today is asymptomatic but continues to be hypotensive.  Patient denies any chest pains, shortness of breath, dizziness, focal numbness or weakness anywhere.  He denies any blurry vision, syncope.  Patient also denies any nausea vomiting diarrhea.    PAST MEDICAL HISTORY:   Past Medical History:  Diagnosis Date  . Anxiety   . Arthritis    knees and hands  . Bipolar disorder (HCC)   . Depression   . GERD (gastroesophageal reflux disease)   . Hepatitis    HEP "C"  . History of kidney stones   . Hypertension   . Kidney stones   . Pericarditis 05/2015   a. echo 5/17: EF 60-65%, no RWMA, LV dias fxn nl, LA mildly dilated, RV sys fxn nl, PASP nl, moderate sized circumferential pericardial effusion was identified, 2.12 cm around the LV free wall, <1 cm around the RV free wall. Features were not c/w tamponade physiology  . PTSD (post-traumatic stress disorder)    Witnessed brother's suicide.  Marland Kitchen Restless leg syndrome   . Syncope     PAST SURGICAL HISTOIRY:   Past Surgical History:  Procedure Laterality Date  . CYSTOSCOPY WITH URETEROSCOPY AND STENT PLACEMENT    . ESOPHAGOGASTRODUODENOSCOPY N/A  01/11/2016   Procedure: ESOPHAGOGASTRODUODENOSCOPY (EGD);  Surgeon: Charlott Rakes, MD;  [facility];  Service: Endoscopy;  Laterality: N/A;  . JOINT REPLACEMENT Right    TKR  . KNEE ARTHROSCOPY Right 06/25/2014   Procedure: ARTHROSCOPY KNEE;  Surgeon: Deeann Saint, MD;  Location: [facility] ORS;  Service: Orthopedics;  Laterality: Right;  partial arthroscopic medial menisectomy  . TOTAL KNEE ARTHROPLASTY Right 04/22/2015   Procedure: TOTAL KNEE ARTHROPLASTY;  Surgeon: Deeann Saint, MD;  Location: [facility] ORS;  Service: Orthopedics;  Laterality: Right;  . TOTAL KNEE ARTHROPLASTY Left 10/30/2017   Procedure: TOTAL KNEE ARTHROPLASTY;  Surgeon: Deeann Saint, MD;  Location: [facility] ORS;  Service: Orthopedics;  Laterality: Left;    SOCIAL HISTORY:   Social History   Tobacco Use  . Smoking status: Former Smoker    Packs/day: 0.75    Years: 20.00    Pack years: 15.00    Types: Cigarettes    Last attempt to quit: 05/16/1984    Years since quitting: 33.4  . Smokeless tobacco: Never Used  Substance Use Topics  . Alcohol use: Yes    Comment: occ    FAMILY HISTORY:   Family History  Problem Relation Age of Onset  . CVA Mother        deceased at age 52  . Depression Brother        Died by suicide at age 60    DRUG ALLERGIES:   Allergies  Allergen Reactions  . Toradol [Ketorolac Tromethamine] Hives  REVIEW OF SYSTEMS:   Review of Systems  Constitutional: Negative for fever and weight loss.  HENT: Negative for congestion, nosebleeds and tinnitus.   Eyes: Negative for blurred vision, double vision and redness.  Respiratory: Negative for cough, hemoptysis and shortness of breath.   Cardiovascular: Negative for chest pain, orthopnea, leg swelling and PND.  Gastrointestinal: Negative for abdominal pain, diarrhea, melena, nausea and vomiting.  Genitourinary: Negative for dysuria, hematuria and urgency.  Musculoskeletal: Negative for falls and joint pain.  Neurological:  Positive for dizziness. Negative for tingling, sensory change, focal weakness, seizures, weakness and headaches.  Endo/Heme/Allergies: Negative for polydipsia. Does not bruise/bleed easily.  Psychiatric/Behavioral: Negative for depression and memory loss. The patient is not nervous/anxious.      MEDICATIONS AT HOME:   Prior to Admission medications   Medication Sig Start Date End Date Taking? Authorizing Provider  aspirin EC 81 MG tablet Take 81 mg by mouth daily.   Yes [provider]  benztropine (COGENTIN) 0.5 MG tablet Take 1 tablet (0.5 mg total) by mouth 2 (two) times daily as needed for tremors. Patient taking differently: Take 1 mg by mouth 2 (two) times daily as needed for tremors.  04/25/15  Yes Deeann Saint, MD  divalproex (DEPAKOTE) 500 MG DR tablet Take 500 mg by mouth 2 (two) times daily.   Yes [provider]  enalapril (VASOTEC) 10 MG tablet Take 10 mg by mouth daily.   Yes [provider]  FLUoxetine (PROZAC) 20 MG capsule Take 80 mg by mouth daily.    Yes [provider]  QUEtiapine (SEROQUEL) 50 MG tablet Take 50 mg by mouth 4 (four) times daily as needed (for agitation).    Yes [provider]  SEROQUEL XR 400 MG 24 hr tablet Take 400 mg by mouth at bedtime.    Yes [provider]  sucralfate (CARAFATE) 1 g tablet Take 1 tablet (1 g total) by mouth 4 (four) times daily -  with meals and at bedtime. Patient taking differently: Take 1 g by mouth daily as needed (acid reflux).  01/12/16  Yes Katharina Caper, MD  gabapentin (NEURONTIN) 400 MG capsule Take 1 capsule (400 mg total) by mouth 3 (three) times daily. Patient not taking: Reported on 10/10/2017 04/25/15   Deeann Saint, MD  pantoprazole (PROTONIX) 40 MG tablet Take 1 tablet (40 mg total) by mouth 2 (two) times daily. Patient not taking: Reported on 12/07/2016 06/02/15   Enid Baas, MD  rosuvastatin (CRESTOR) 10 MG tablet Take 1 tablet (10 mg total) by mouth  daily. Patient not taking: Reported on 02/27/2017 06/02/15   Enid Baas, MD  zolpidem (AMBIEN) 10 MG tablet Take 10 mg by mouth at bedtime as needed for sleep.    [provider]      VITAL SIGNS:  Blood pressure 124/71, pulse (!) 112, temperature 98 F (36.7 C), temperature source Oral, resp. rate 18, SpO2 98 %.  PHYSICAL EXAMINATION:  GENERAL:  57 y.o.-year-old patient lying in the bed with no acute distress.  EYES: Pupils equal, round, reactive to light and accommodation. No scleral icterus. Extraocular muscles intact.  HEENT: Head atraumatic, normocephalic. Oropharynx and nasopharynx clear.  NECK:  Supple, no jugular venous distention. No thyroid enlargement, no tenderness.  LUNGS: Normal breath sounds bilaterally, no wheezing, rales, rhonchi . No use of accessory muscles of respiration.  CARDIOVASCULAR: S1, S2, RRR. II/VI SEM At base, rubs, gallops, clicks.  ABDOMEN: Soft, nontender, nondistended. Bowel sounds present. No organomegaly or mass.  EXTREMITIES: No pedal edema, cyanosis, or clubbing.  Left Leg in a Immobilizer. NEUROLOGIC: Cranial nerves II through XII are intact. No focal motor or sensory deficits appreciated bilaterally  PSYCHIATRIC: The patient is alert and oriented x 3.  SKIN: No obvious rash, lesion, or ulcer.   LABORATORY PANEL:   CBC Recent Labs  Lab 11/02/17 0416  WBC 5.4  HGB 9.9*  HCT 30.3*  PLT 206   ------------------------------------------------------------------------------------------------------------------  Chemistries  Recent Labs  Lab 10/31/17 0351  NA 139  K 3.9  CL 110  CO2 23  GLUCOSE 128*  BUN 11  CREATININE 1.02  CALCIUM 8.6*   ------------------------------------------------------------------------------------------------------------------  Cardiac Enzymes No results for input(s): TROPONINI in the last 168  hours. ------------------------------------------------------------------------------------------------------------------  RADIOLOGY:  No results found.   IMPRESSION AND PLAN:   57 year old male with past medical history of bipolar disorder, PTSD, hypertension, osteoarthritis, previous history of pericarditis, neuropathy who presents to the hospital for a left total knee replacement postop day #2 noted to be hypotensive.  1.  Hypotension- likely multifactorial related to volume loss from a surgery, mild dehydration and may be related to pain meds/polypharmacy. - No acute need for blood transfusion, will give the patient 1 L IV fluid bolus and repeat orthostatics. - No clinical evidence of sepsis or infection.  2.  Murmur- patient on physical exam noted to have a murmur at the base of his heart. -I will check an echocardiogram.  His last previous echo in 2017 showed normal ejection fraction.  3.  Essential hypertension-hold patient's enalapril given his relative hypotension for now.  4.  History of bipolar disorder-continue Seroquel, Depakote, Prozac.  5.  Neuropathy-continue gabapentin.  6.  Status post left knee replacement postop day #2-continue pain control and further care as per orthopedics. -Continue physical therapy as tolerated. -Lovenox for DVT prophylaxis.  Thanks for the consultation and will follow along with you  All the records are reviewed and case discussed with Consulting provider. Management plans discussed with the patient, family and they are in agreement.  CODE STATUS: Full code  TOTAL TIME TAKING CARE OF THIS PATIENT: 45 minutes.    Houston Siren M.D on 11/02/2017 at 1:59 PM  Between 7am to 6pm - Pager - [phone]  After 6pm go to [email or web address] - password [email or web address]  [facility]  Office  [phone]  CC: Primary care Physician: [facility]

## 2017-11-02 NOTE — Progress Notes (Signed)
Physical Therapy Treatment [name] MRN: [id number] DOB: Dec 15, 1960 Today's Date: 11/02/2017    History of Present Illness Pt is a 57 y/o M s/p L TKA.  Pt's PMH includes R TKA, PTSD, hepatitis, bipolar disorder, anxiety, syncope.     PT Comments    Pt ready for session.  Participated in exercises as described below.  Pt stood and was able to ambulate x 1 around unit with walker and min guard.  KI donned.  Pt with no c/o dizziness.  BP 107/70 P 97 prior to gait, 104/70 P 106 after gait.  Discussed with primary nurse.  Up in chair after session.   Follow Up Recommendations  Outpatient PT     Equipment Recommendations  None recommended by PT    Recommendations for Other Services       Precautions / Restrictions Precautions Precautions: Fall;Knee;Other (comment) Precaution Booklet Issued: (exercise handout previously provided) Precaution Comments: monitor BP; HTN and orthostatic Required Braces or Orthoses: Knee Immobilizer - Left Knee Immobilizer - Left: ("when walking") Restrictions Weight Bearing Restrictions: Yes LLE Weight Bearing: Weight bearing as tolerated    Mobility  Bed Mobility Overal bed mobility: Needs Assistance;Modified Independent Bed Mobility: Supine to Sit     Supine to sit: Supervision Sit to supine: Min guard   General bed mobility comments: Cues to move slowly from supine>sit to avoid BP drop.  Pt uses LUE to assist LLE to EOB.    Transfers Overall transfer level: Needs assistance Equipment used: Rolling walker (2 wheeled) Transfers: Sit to/from Stand Sit to Stand: Min guard         General transfer comment: Cues for proper hand placement.    Ambulation/Gait Ambulation/Gait assistance: Min guard Gait Distance (Feet): 200 Feet Assistive device: Rolling walker (2 wheeled) Gait Pattern/deviations: Decreased stride length;Decreased stance time - left;Decreased weight shift to left;Step-to pattern;Step-through  pattern Gait velocity: decreased   General Gait Details: Not safe to attempt at this time due to +orthostatics and pt reporting pressure behind his eyes   Stairs             Wheelchair Mobility    Modified Rankin (Stroke Patients Only)       Balance Overall balance assessment: Needs assistance Sitting-balance support: No upper extremity supported;Feet supported Sitting balance-Leahy Scale: Good     Standing balance support: Bilateral upper extremity supported;During functional activity Standing balance-Leahy Scale: Fair Standing balance comment: Pt relies on BUE support for static and dynamic activities                            Cognition Arousal/Alertness: Awake/alert Behavior During Therapy: WFL for tasks assessed/performed Overall Cognitive Status: Within Functional Limits for tasks assessed                                        Exercises Total Joint Exercises Ankle Circles/Pumps: AROM;Both;10 reps;Supine Quad Sets: Strengthening;Both;10 reps;Supine Short Arc Quad: Strengthening;Left;Supine;10 reps Heel Slides: AROM;Left;5 reps;Supine Hip ABduction/ADduction: AROM;Left;Supine;Strengthening;10 reps Straight Leg Raises: Left;Supine;Strengthening;AROM;10 reps;AAROM Knee Flexion: AAROM;Left;5 reps;Seated;Other (comment) Goniometric ROM: -4-76    General Comments General comments (skin integrity, edema, etc.): BP stable during session this pm.      Pertinent Vitals/Pain Pain Assessment: Faces Faces Pain Scale: Hurts little more Pain Location: L knee Pain Descriptors / Indicators: Aching;Grimacing;Guarding;Pressure Pain Intervention(s): Limited activity within patient's tolerance;Monitored during  session    Home Living                      Prior Function            PT Goals (current goals can now be found in the care plan section) Acute Rehab PT Goals Patient Stated Goal: to feel better and go home PT Goal  Formulation: With patient Time For Goal Achievement: 11/13/17 Potential to Achieve Goals: Good Progress towards PT goals: Progressing toward goals    Frequency    BID      PT Plan Current plan remains appropriate    Co-evaluation              AM-PAC PT "6 Clicks" Daily Activity  Outcome Measure  Difficulty turning over in bed (including adjusting bedclothes, sheets and blankets)?: None Difficulty moving from lying on back to sitting on the side of the bed? : None Difficulty sitting down on and standing up from a chair with arms (e.g., wheelchair, bedside commode, etc,.)?: A Little Help needed moving to and from a bed to chair (including a wheelchair)?: A Little Help needed walking in hospital room?: A Little Help needed climbing 3-5 steps with a railing? : A Little 6 Click Score: 20    End of Session Equipment Utilized During Treatment: Gait belt;Left knee immobilizer Activity Tolerance: Patient tolerated treatment well Patient left: in chair;with chair alarm set;with call bell/phone within reach Nurse Communication: Other (comment) PT Visit Diagnosis: Pain;Other abnormalities of gait and mobility (R26.89);Muscle weakness (generalized) (M62.81) Pain - Right/Left: Left Pain - part of body: Knee     Time: 9604-5409 PT Time Calculation (min) (ACUTE ONLY): 24 min  Charges:  $Gait Training: 8-22 mins $Therapeutic Exercise: 8-22 mins $Therapeutic Activity: 8-22 mins                     Danielle Dess, PTA 11/02/17, 3:07 PM

## 2017-11-03 ENCOUNTER — Inpatient Hospital Stay (HOSPITAL_COMMUNITY)
Admission: RE | Admit: 2017-11-03 | Discharge: 2017-11-03 | Disposition: A | Discharge status: Home / Self Care | Payer: Medicaid Other | Source: Ambulatory Visit | Attending: Specialist | Admitting: Specialist

## 2017-11-03 DIAGNOSIS — I340 Nonrheumatic mitral (valve) insufficiency: Secondary | ICD-10-CM

## 2017-11-03 LAB — ECHOCARDIOGRAM COMPLETE

## 2017-11-03 MED ORDER — GABAPENTIN 400 MG PO CAPS: 400.0000 mg | ORAL_CAPSULE | Freq: Three times a day (TID) | ORAL | 3 refills | Status: DC

## 2017-11-03 MED ORDER — GABAPENTIN 400 MG PO CAPS: 400.0000 mg | ORAL_CAPSULE | Freq: Two times a day (BID) | ORAL | 3 refills | Status: DC

## 2017-11-03 MED ORDER — MELOXICAM 15 MG PO TABS: 15.0000 mg | ORAL_TABLET | Freq: Every day | ORAL | 3 refills | Status: DC

## 2017-11-03 MED ORDER — HYDROCODONE-ACETAMINOPHEN 5-325 MG PO TABS: 1.0000 | ORAL_TABLET | Freq: Four times a day (QID) | ORAL | 0 refills | Status: DC | PRN

## 2017-11-03 MED ORDER — ASPIRIN EC 325 MG PO TBEC: 325.0000 mg | DELAYED_RELEASE_TABLET | Freq: Every day | ORAL | 0 refills | Status: DC

## 2017-11-03 NOTE — Progress Notes (Signed)
Discharged reviewed with verbal understanding. Rxs given upon discharge. Escorted to personal vehicle via wc.

## 2017-11-03 NOTE — Discharge Summary (Signed)
Physician Discharge Summary  Patient ID: [name] MRN: [id number] DOB/AGE: [birth year] 57 y.o.  Admit date: 10/30/2017 Discharge date: 11/03/2017  Admission Diagnoses: Arthritis left knee  Discharge Diagnoses:  Active Problems:   Total knee replacement status   Discharged Condition: good  Hospital Course: Patient had left total knee replacement 10/30/2017.  He did well postoperatively except for orthostatic hypotension for several days.  This delayed his discharge until today 11/03/2017.  Is feeling better and ready for discharge.  Consults: Hospitalist service  Significant Diagnostic Studies: radiology: X-Ray: Satisfactory left total knee replacement  Treatments: antibiotics: vancomycin  Discharge Exam: Blood pressure 126/84, pulse 92, temperature 98 F (36.7 C), resp. rate 18, SpO2 98 %. Left leg incision dry and clean.  Dressing dry.  Neurovascular status good distally.  Normal swelling.   Will return to office next week for x-ray and evaluation  Disposition: Discharge disposition: 01-Home or Self Care       Discharge Instructions    Call MD for:  persistant nausea and vomiting   Complete by:  As directed    Call MD for:  redness, tenderness, or signs of infection (pain, swelling, redness, odor or green/yellow discharge around incision site)   Complete by:  As directed    Call MD for:  severe uncontrolled pain   Complete by:  As directed    Call MD for:  temperature >100.4   Complete by:  As directed    Diet general   Complete by:  As directed    Increase activity slowly   Complete by:  As directed    Leave dressing on - Keep it clean, dry, and intact until clinic visit   Complete by:  As directed      Allergies as of 11/03/2017      Reactions   Toradol [ketorolac Tromethamine] Hives      Medication List    TAKE these medications   aspirin EC 325 MG tablet Take 1 tablet (325 mg total) by mouth daily. What changed:    medication  strength  how much to take   benztropine 0.5 MG tablet Commonly known as:  COGENTIN Take 1 tablet (0.5 mg total) by mouth 2 (two) times daily as needed for tremors. What changed:  how much to take   divalproex 500 MG DR tablet Commonly known as:  DEPAKOTE Take 500 mg by mouth 2 (two) times daily.   enalapril 10 MG tablet Commonly known as:  VASOTEC Take 10 mg by mouth daily.   FLUoxetine 20 MG capsule Commonly known as:  PROZAC Take 80 mg by mouth daily.   gabapentin 400 MG capsule Commonly known as:  NEURONTIN Take 1 capsule (400 mg total) by mouth 3 (three) times daily. What changed:  Another medication with the same name was added. Make sure you understand how and when to take each.   gabapentin 400 MG capsule Commonly known as:  NEURONTIN Take 1 capsule (400 mg total) by mouth 2 (two) times daily. What changed:  You were already taking a medication with the same name, and this prescription was added. Make sure you understand how and when to take each.   HYDROcodone-acetaminophen 5-325 MG tablet Commonly known as:  NORCO/VICODIN Take 1-2 tablets by mouth every 6 (six) hours as needed.   meloxicam 15 MG tablet Commonly known as:  MOBIC Take 1 tablet (15 mg total) by mouth daily.   pantoprazole 40 MG tablet Commonly known as:  PROTONIX Take 1 tablet (40 mg  total) by mouth 2 (two) times daily.   rosuvastatin 10 MG tablet Commonly known as:  CRESTOR Take 1 tablet (10 mg total) by mouth daily.   SEROQUEL XR 400 MG 24 hr tablet Generic drug:  QUEtiapine Take 400 mg by mouth at bedtime.   QUEtiapine 50 MG tablet Commonly known as:  SEROQUEL Take 50 mg by mouth 4 (four) times daily as needed (for agitation).   sucralfate 1 g tablet Commonly known as:  CARAFATE Take 1 tablet (1 g total) by mouth 4 (four) times daily -  with meals and at bedtime. What changed:    when to take this  reasons to take this   zolpidem 10 MG tablet Commonly known as:  AMBIEN Take  10 mg by mouth at bedtime as needed for sleep.            Durable Medical Equipment  (From admission, onward)         Start     Ordered   11/03/17 1307  DME 3-in-1  Once     11/03/17 1311   10/30/17 1238  DME Walker rolling  Once    Question:  Patient needs a walker to treat with the following condition  Answer:  Total knee replacement status   10/30/17 1237           Signed: Deeann Saint E 11/03/2017, 1:14 PM

## 2017-11-03 NOTE — Progress Notes (Signed)
*  PRELIMINARY RESULTS* Echocardiogram 2D Echocardiogram has been performed.  [name] 11/03/2017, 1:16 PM

## 2017-11-03 NOTE — Progress Notes (Signed)
Physical Therapy Treatment [name] MRN: [id number] DOB: Jun 15, 1960 Today's Date: 11/03/2017    History of Present Illness Pt is a 57 y/o M s/p L TKA.  Pt's PMH includes R TKA, PTSD, hepatitis, bipolar disorder, anxiety, syncope.     PT Comments    Pt presents with deficits in strength, transfers, mobility, gait, balance, L knee ROM, and activity tolerance but continues to progress well towards goals.  Orthostatic BPs taken and recorded with no adverse symptoms throughout the session.  Pt was able to perform bed mobility tasks with Mod Ind using his UEs to assist his LLE in and out of bed.  Pt was steady with transfers with good control and was able to amb 1 x 125' and 1 x 200' with improving stride length and cadence.  Pt was steady ascending and descending one step with a RW with good carryover on proper sequencing.  Pt will benefit from OPPT services upon discharge to address the above deficits for decreased caregiver assistance and eventual return to PLOF.     Follow Up Recommendations  Outpatient PT     Equipment Recommendations  None recommended by PT    Recommendations for Other Services       Precautions / Restrictions Precautions Precautions: Fall;Knee Precaution Comments: monitor BP; HTN and orthostatic Required Braces or Orthoses: Knee Immobilizer - Left Knee Immobilizer - Left: Other (comment)(KI on when walking) Restrictions Weight Bearing Restrictions: Yes LLE Weight Bearing: Weight bearing as tolerated    Mobility  Bed Mobility Overal bed mobility: Modified Independent             General bed mobility comments: Extra time and effort with bed mobility tasks but no physical assistance required  Transfers Overall transfer level: Needs assistance Equipment used: Rolling walker (2 wheeled) Transfers: Sit to/from Stand Sit to Stand: Supervision         General transfer comment: Good control and stability with transfers with  min verbal cues for sequencing with LLE KI donned  Ambulation/Gait Ambulation/Gait assistance: Supervision Gait Distance (Feet): 200 Feet Assistive device: Rolling walker (2 wheeled) Gait Pattern/deviations: Decreased stride length;Decreased stance time - left;Step-through pattern Gait velocity: decreased   General Gait Details: Improving cadence and reciprocal gait pattern   Stairs Stairs: Yes Stairs assistance: Min guard Stair Management: No rails;With walker Number of Stairs: 1 General stair comments: Ascend/descend 1 step with CGA and a RW x 6 with min verbal cues for sequencing with good carryover   Wheelchair Mobility    Modified Rankin (Stroke Patients Only)       Balance Overall balance assessment: Needs assistance Sitting-balance support: No upper extremity supported;Feet supported Sitting balance-Leahy Scale: Good     Standing balance support: Bilateral upper extremity supported;During functional activity Standing balance-Leahy Scale: Good Standing balance comment: Very light pressure on the RW during amb with good stability                            Cognition Arousal/Alertness: Awake/alert Behavior During Therapy: WFL for tasks assessed/performed Overall Cognitive Status: Within Functional Limits for tasks assessed                                        Exercises Total Joint Exercises Ankle Circles/Pumps: AROM;Both;10 reps;Supine Quad Sets: Strengthening;Both;10 reps;Supine;AROM Heel Slides: AROM;Left;5 reps;Supine Hip ABduction/ADduction: AROM;Left;Strengthening;10 reps Straight Leg  Raises: Left;Strengthening;AROM;10 reps;AAROM Knee Flexion: Left;Seated;AROM;10 reps;15 reps Goniometric ROM: L knee AROM 5-68 deg Marching in Standing: AROM;Both;10 reps Other Exercises Other Exercises: HEP review/education per handout Other Exercises: Positioning education to encourage L knee ext ROM    General Comments         Pertinent Vitals/Pain Pain Assessment: 0-10 Pain Score: 5  Pain Location: L knee Pain Descriptors / Indicators: Aching;Grimacing;Guarding;Pressure Pain Intervention(s): Premedicated before session;Monitored during session    Home Living                      Prior Function            PT Goals (current goals can now be found in the care plan section) Progress towards PT goals: Progressing toward goals    Frequency    BID      PT Plan Current plan remains appropriate    Co-evaluation              AM-PAC PT "6 Clicks" Daily Activity  Outcome Measure                   End of Session Equipment Utilized During Treatment: Gait belt;Left knee immobilizer Activity Tolerance: Patient tolerated treatment well Patient left: in chair;with chair alarm set;with call bell/phone within reach;Other (comment)(Polar care donned) Nurse Communication: Mobility status PT Visit Diagnosis: Pain;Other abnormalities of gait and mobility (R26.89);Muscle weakness (generalized) (M62.81) Pain - Right/Left: Left Pain - part of body: Knee     Time: 0925-1006 PT Time Calculation (min) (ACUTE ONLY): 41 min  Charges:  $Gait Training: 23-37 mins $Therapeutic Exercise: 8-22 mins                     D. Scott Phoenix Dresser PT, DPT 11/03/17, 12:09 PM

## 2017-11-03 NOTE — Progress Notes (Signed)
[employer] - [place] [facility]   PATIENT NAME: [name]    MR#:  [id number]  DATE OF BIRTH:  13-Mar-1960  SUBJECTIVE:  CHIEF COMPLAINT:  No chief complaint on file.  No complaint except left knee pain. REVIEW OF SYSTEMS:  Review of Systems  Constitutional: Negative for chills, fever and malaise/fatigue.  HENT: Negative for sore throat.   Eyes: Negative for blurred vision and double vision.  Respiratory: Negative for cough, hemoptysis, shortness of breath, wheezing and stridor.   Cardiovascular: Negative for chest pain, palpitations, orthopnea and leg swelling.  Gastrointestinal: Negative for abdominal pain, blood in stool, diarrhea, melena, nausea and vomiting.  Genitourinary: Negative for dysuria, flank pain and hematuria.  Musculoskeletal: Positive for joint pain. Negative for back pain.  Neurological: Negative for dizziness, sensory change, focal weakness, seizures, loss of consciousness, weakness and headaches.  Endo/Heme/Allergies: Negative for polydipsia.  Psychiatric/Behavioral: Negative for depression. The patient is not nervous/anxious.     DRUG ALLERGIES:   Allergies  Allergen Reactions  . Toradol [Ketorolac Tromethamine] Hives   VITALS:  Blood pressure 126/84, pulse 92, temperature 98 F (36.7 C), resp. rate 18, SpO2 100 %. PHYSICAL EXAMINATION:  Physical Exam  Constitutional: He is oriented to person, place, and time.  HENT:  Head: Normocephalic.  Mouth/Throat: Oropharynx is clear and moist.  Eyes: Pupils are equal, round, and reactive to light. Conjunctivae and EOM are normal. No scleral icterus.  Neck: Normal range of motion. Neck supple. No JVD present. No tracheal deviation present.  Cardiovascular: Normal rate, regular rhythm and normal heart sounds. Exam reveals no gallop.  No murmur heard. Pulmonary/Chest: Effort normal and breath sounds normal. No respiratory distress. He has no wheezes. He has no rales.  Abdominal: Soft.  Bowel sounds are normal. He exhibits no distension. There is no tenderness. There is no rebound.  Musculoskeletal: He exhibits no edema or tenderness.  Left knee in dressing  Neurological: He is alert and oriented to person, place, and time. No cranial nerve deficit.  Skin: No rash noted. No erythema.   LABORATORY PANEL:  Male CBC Recent Labs  Lab 11/02/17 0416  WBC 5.4  HGB 9.9*  HCT 30.3*  PLT 206   ------------------------------------------------------------------------------------------------------------------ Chemistries  Recent Labs  Lab 10/31/17 0351  NA 139  K 3.9  CL 110  CO2 23  GLUCOSE 128*  BUN 11  CREATININE 1.02  CALCIUM 8.6*   RADIOLOGY:  No results found. ASSESSMENT AND PLAN:  57 year old male with past medical history of bipolar disorder, PTSD, hypertension, osteoarthritis, previous history of pericarditis, neuropathy who presents to the hospital for a left total knee replacement postop day #2 noted to be hypotensive.  1.  Hypotension- likely multifactorial related to volume loss from a surgery, mild dehydration and may be related to pain meds/polypharmacy. Improved with 1 L IV fluid bolus. Avoid narcotics if possible. - No clinical evidence of sepsis or infection.  2.  Murmur- patient on physical exam noted to have a murmur at the base of his heart. Follow up echocardiogram.  His last previous echo in 2017 showed normal ejection fraction.  3.  Essential hypertension-hold patient's enalapril given his relative hypotension for now.  4.  History of bipolar disorder-continue Seroquel, Depakote, Prozac.  5.  Neuropathy-continue gabapentin.  6.  Status post left knee replacement postop day #3-continue pain control and further care as per orthopedics. -Continue physical therapy as tolerated. PT: outpatient PT. -Lovenox for DVT prophylaxis.  Medically stable, sign off. All the  records are reviewed and case discussed with Care Management/Social  Worker. Management plans discussed with the patient, family and they are in agreement.  CODE STATUS: Full Code  TOTAL TIME TAKING CARE OF THIS PATIENT: 20 minutes.   More than 50% of the time was spent in counseling/coordination of care: YES  POSSIBLE D/C TODAY, DEPENDING ON CLINICAL CONDITION.   Shaune Pollack M.D on 11/03/2017 at 9:13 AM  Between 7am to 6pm - Pager - [phone]  After 6pm go to [email or web address] - Therapist, nutritional Hospitalists

## 2017-11-03 NOTE — Progress Notes (Signed)
Subjective: 4 Days Post-Op Procedure(s) (LRB): TOTAL KNEE ARTHROPLASTY (Left)   Patient says he feels much better today.  He did go home. Echocardiogram done and no abnormalities noted.  Patient reports pain as mild.  Objective:   VITALS:   Vitals:   11/03/17 0853 11/03/17 0930  BP: 126/84   Pulse: 92   Resp: 18   Temp: 98 F (36.7 C)   SpO2: 100% 98%    Neurologically intact ABD soft Neurovascular intact Sensation intact distally Intact pulses distally Dorsiflexion/Plantar flexion intact Incision: dressing C/D/I  LABS Recent Labs    11/01/17 0629 11/02/17 0416  HGB 11.6* 9.9*  HCT 34.3* 30.3*  WBC 7.5 5.4  PLT 201 206    No results for input(s): NA, K, BUN, CREATININE, GLUCOSE in the last 72 hours.  No results for input(s): LABPT, INR in the last 72 hours.   Assessment/Plan: 4 Days Post-Op Procedure(s) (LRB): TOTAL KNEE ARTHROPLASTY (Left)   Patient is ready for discharge. Take enteric-coated aspirin 325 mg twice daily for 6 weeks Weightbearing as tolerated on a walker Will return to my office next week for evaluation

## 2017-12-04 ENCOUNTER — Encounter: Payer: Self-pay | Admitting: *Deleted

## 2017-12-04 ENCOUNTER — Emergency Department
Admission: EM | Admit: 2017-12-04 | Discharge: 2017-12-04 | Disposition: A | Discharge status: Home / Self Care | Payer: Medicaid Other | Attending: Emergency Medicine | Admitting: Emergency Medicine

## 2017-12-04 ENCOUNTER — Other Ambulatory Visit: Payer: Self-pay

## 2017-12-04 ENCOUNTER — Emergency Department: Payer: Medicaid Other

## 2017-12-04 DIAGNOSIS — Z96653 Presence of artificial knee joint, bilateral: Secondary | ICD-10-CM | POA: Diagnosis not present

## 2017-12-04 DIAGNOSIS — Z79899 Other long term (current) drug therapy: Secondary | ICD-10-CM | POA: Insufficient documentation

## 2017-12-04 DIAGNOSIS — L03116 Cellulitis of left lower limb: Secondary | ICD-10-CM | POA: Diagnosis not present

## 2017-12-04 DIAGNOSIS — Z87891 Personal history of nicotine dependence: Secondary | ICD-10-CM | POA: Diagnosis not present

## 2017-12-04 DIAGNOSIS — Z7982 Long term (current) use of aspirin: Secondary | ICD-10-CM | POA: Diagnosis not present

## 2017-12-04 DIAGNOSIS — M25562 Pain in left knee: Secondary | ICD-10-CM | POA: Diagnosis present

## 2017-12-04 DIAGNOSIS — T8484XA Pain due to internal orthopedic prosthetic devices, implants and grafts, initial encounter: Secondary | ICD-10-CM | POA: Diagnosis not present

## 2017-12-04 DIAGNOSIS — Y69 Unspecified misadventure during surgical and medical care: Secondary | ICD-10-CM | POA: Insufficient documentation

## 2017-12-04 DIAGNOSIS — I2510 Atherosclerotic heart disease of native coronary artery without angina pectoris: Secondary | ICD-10-CM | POA: Insufficient documentation

## 2017-12-04 DIAGNOSIS — Z96652 Presence of left artificial knee joint: Secondary | ICD-10-CM

## 2017-12-04 DIAGNOSIS — I10 Essential (primary) hypertension: Secondary | ICD-10-CM | POA: Diagnosis not present

## 2017-12-04 LAB — CBC
HEMATOCRIT: 35.3 % — AB (ref 39.0–52.0)
HEMOGLOBIN: 11.5 g/dL — AB (ref 13.0–17.0)
MCH: 28.8 pg (ref 26.0–34.0)
MCHC: 32.6 g/dL (ref 30.0–36.0)
MCV: 88.5 fL (ref 80.0–100.0)
Platelets: 230 10*3/uL (ref 150–400)
RBC: 3.99 MIL/uL — ABNORMAL LOW (ref 4.22–5.81)
RDW: 14.3 % (ref 11.5–15.5)
WBC: 6.6 10*3/uL (ref 4.0–10.5)
nRBC: 0 % (ref 0.0–0.2)

## 2017-12-04 LAB — BASIC METABOLIC PANEL
Anion gap: 10 (ref 5–15)
BUN: 20 mg/dL (ref 6–20)
CHLORIDE: 102 mmol/L (ref 98–111)
CO2: 24 mmol/L (ref 22–32)
Calcium: 9.9 mg/dL (ref 8.9–10.3)
Creatinine, Ser: 1.1 mg/dL (ref 0.61–1.24)
GFR calc Af Amer: >60 mL/min (ref >60)
GLUCOSE: 112 mg/dL — AB (ref 70–99)
POTASSIUM: 4.2 mmol/L (ref 3.5–5.1)
Sodium: 136 mmol/L (ref 135–145)

## 2017-12-04 MED ORDER — CEPHALEXIN 500 MG PO CAPS
500.0000 mg | ORAL_CAPSULE | Freq: Once | ORAL | Status: AC
  Administered 2017-12-04: 500 mg via ORAL
  Filled 2017-12-04: qty 1

## 2017-12-04 MED ORDER — CEPHALEXIN 500 MG PO CAPS: 500.0000 mg | ORAL_CAPSULE | Freq: Four times a day (QID) | ORAL | 0 refills | Status: AC

## 2017-12-04 MED ORDER — HYDROCODONE-ACETAMINOPHEN 5-325 MG PO TABS
1.0000 | ORAL_TABLET | ORAL | Status: AC
  Administered 2017-12-04: 1 via ORAL
  Filled 2017-12-04: qty 1

## 2017-12-04 MED ORDER — HYDROCODONE-ACETAMINOPHEN 5-325 MG PO TABS: 1.0000 | ORAL_TABLET | Freq: Three times a day (TID) | ORAL | 0 refills | Status: DC | PRN

## 2017-12-04 NOTE — ED Triage Notes (Addendum)
Pt to triage via wheelchair.  Pt had left knee replacement 10/19.  Pt heard a pop while walking today.  Left knee red and swollen.    Pt did not fall.  Pt alert

## 2017-12-04 NOTE — Discharge Instructions (Addendum)
Please rest ice and elevate the left knee.  Continue with Polar Care unit.  Continue to work on the range of motion exercises.  Monitor the amount of redness along your incision site, if this is increasing return to the emergency department.  Also return to the emergency department for any fevers, severe pain worsening symptoms or to changes in your health.  Take antibiotics as prescribed.  Call orthopedic office tomorrow morning to schedule follow-up appoint.

## 2017-12-04 NOTE — ED Provider Notes (Signed)
[facility] EMERGENCY DEPARTMENT Provider Note   CSN: [id number] Arrival date & time: 12/04/17  1623     History   Chief Complaint Chief Complaint  Patient presents with  . Knee Pain    HPI [name] is a 57 y.o. male presents to the emergency department for evaluation of acute left knee pain.  He is status post left total knee arthroplasty on 10/30/2017 by Dr. Hyacinth Meeker.  Patient states he has been performing home exercises at home, has had some stiffness throughout the left knee.  He was doing well until today when he felt a pop with ambulation.  Since pop with ambulation he had increasing pain and a hard time ambulating.  Patient was taking Norco 5 mg twice daily right after surgery but now does not have any medication except for gabapentin.  Patient is able to straight leg raise.  He denies any swelling or edema throughout the left leg.  He has had some erythema to the anterior incision site since surgery this seems to be about the same and not increasing.  He states that he did push on the incision yesterday and got a small dab of purulent pus from a small papule along the incision.  Patient's range of motion today is limited but he states no more limited than what he had been over the last week or 2.  No fevers, chest pain or shortness of breath.  HPI  Past Medical History:  Diagnosis Date  . Anxiety   . Arthritis    knees and hands  . Bipolar disorder (HCC)   . Depression   . GERD (gastroesophageal reflux disease)   . Hepatitis    HEP "C"  . History of kidney stones   . Hypertension   . Kidney stones   . Pericarditis 05/2015   a. echo 5/17: EF 60-65%, no RWMA, LV dias fxn nl, LA mildly dilated, RV sys fxn nl, PASP nl, moderate sized circumferential pericardial effusion was identified, 2.12 cm around the LV free wall, <1 cm around the RV free wall. Features were not c/w tamponade physiology  . PTSD (post-traumatic stress disorder)    Witnessed  brother's suicide.  Marland Kitchen Restless leg syndrome   . Syncope     Patient Active Problem List   Diagnosis Date Noted  . History of esophagogastroduodenoscopy (EGD) 01/12/2016  . Esophagitis 01/12/2016  . Hypokalemia 01/12/2016  . Leukocytosis 01/12/2016  . Nausea and vomiting 01/11/2016  . Odynophagia 01/11/2016  . Abdominal pain 01/06/2016  . Anemia 06/02/2015  . Chest pain with moderate risk for cardiac etiology   . Coronary artery disease involving native coronary artery of native heart with angina pectoris (HCC)   . Hyperlipidemia   . Pleuritic chest pain 06/01/2015  . Pericarditis with effusion 05/13/2015  . Chest pain at rest 05/12/2015  . Dyspnea 05/12/2015  . SIRS (systemic inflammatory response syndrome) (HCC) 05/12/2015  . 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 CYSTOSCOPY WITH URETEROSCOPY AND STENT PLACEMENT    . ESOPHAGOGASTRODUODENOSCOPY N/A 01/11/2016   Procedure: ESOPHAGOGASTRODUODENOSCOPY (EGD);  Surgeon: Charlott Rakes, MD;  Location: [facility] END[facility]COPY;  Service: Endoscopy;  Laterality: N/A;  . JOINT REPLACEMENT Right    TKR  . KNEE ARTHROSCOPY Right 06/25/2014   Procedure: ARTHROSCOPY KNEE;  Surgeon: Deeann Saint, MD;  Location: [facility] ORS;  Service: Orthopedics;  Laterality: Right;  partial arthroscopic medial menisectomy  . TOTAL KNEE ARTHROPLASTY Right 04/22/2015  Procedure: TOTAL KNEE ARTHROPLASTY;  Surgeon: Deeann Saint, MD;  Location: [facility] ORS;  Service: Orthopedics;  Laterality: Right;  . TOTAL KNEE ARTHROPLASTY Left 10/30/2017   Procedure: TOTAL KNEE ARTHROPLASTY;  Surgeon: Deeann Saint, MD;  Location: [facility] ORS;  Service: Orthopedics;  Laterality: Left;        Home Medications    Prior to Admission medications   Medication Sig Start Date End Date Taking? Authorizing Provider  aspirin EC 325 MG tablet Take 1 tablet (325 mg total) by mouth daily. 11/03/17   Deeann Saint, MD  benztropine  (COGENTIN) 0.5 MG tablet Take 1 tablet (0.5 mg total) by mouth 2 (two) times daily as needed for tremors. Patient taking differently: Take 1 mg by mouth 2 (two) times daily as needed for tremors.  04/25/15   Deeann Saint, MD  cephALEXin (KEFLEX) 500 MG capsule Take 1 capsule (500 mg total) by mouth 4 (four) times daily for 7 days. 12/04/17 12/11/17  Evon Slack, PA-C  divalproex (DEPAKOTE) 500 MG DR tablet Take 500 mg by mouth 2 (two) times daily.    [provider]  enalapril (VASOTEC) 10 MG tablet Take 10 mg by mouth daily.    [provider]  FLUoxetine (PROZAC) 20 MG capsule Take 80 mg by mouth daily.     [provider]  gabapentin (NEURONTIN) 400 MG capsule Take 1 capsule (400 mg total) by mouth 3 (three) times daily. 11/03/17   Deeann Saint, MD  gabapentin (NEURONTIN) 400 MG capsule Take 1 capsule (400 mg total) by mouth 2 (two) times daily. 11/03/17   Deeann Saint, MD  HYDROcodone-acetaminophen (NORCO) 5-325 MG tablet Take 1 tablet by mouth every 8 (eight) hours as needed for moderate pain. 12/04/17   Evon Slack, PA-C  meloxicam (MOBIC) 15 MG tablet Take 1 tablet (15 mg total) by mouth daily. 11/03/17   Deeann Saint, MD  pantoprazole (PROTONIX) 40 MG tablet Take 1 tablet (40 mg total) by mouth 2 (two) times daily. Patient not taking: Reported on 12/07/2016 06/02/15   Enid Baas, MD  QUEtiapine (SEROQUEL) 50 MG tablet Take 50 mg by mouth 4 (four) times daily as needed (for agitation).     [provider]  rosuvastatin (CRESTOR) 10 MG tablet Take 1 tablet (10 mg total) by mouth daily. Patient not taking: Reported on 02/27/2017 06/02/15   Enid Baas, MD  SEROQUEL XR 400 MG 24 hr tablet Take 400 mg by mouth at bedtime.     [provider]  sucralfate (CARAFATE) 1 g tablet Take 1 tablet (1 g total) by mouth 4 (four) times daily -  with meals and at bedtime. Patient taking differently: Take 1 g by mouth daily as needed  (acid reflux).  01/12/16   Katharina Caper, MD  zolpidem (AMBIEN) 10 MG tablet Take 10 mg by mouth at bedtime as needed for sleep.    [provider]    Family History Family History  Problem Relation Age of Onset  . CVA Mother        deceased at age 1  . Depression Brother        Died by suicide at age 85    Social History Social History   Tobacco Use  . Smoking status: Former Smoker    Packs/day: 0.75    Years: 20.00    Pack years: 15.00    Types: Cigarettes    Last attempt to quit: 05/16/1984    Years since quitting: 33.5  . Smokeless tobacco:  Never Used  Substance Use Topics  . Alcohol use: Yes    Comment: occ  . Drug use: No     Allergies   Toradol [ketorolac tromethamine]   Review of Systems Review of Systems  Constitutional: Negative for fever.  Respiratory: Negative for shortness of breath.   Cardiovascular: Negative for chest pain and leg swelling.  Musculoskeletal: Positive for arthralgias and joint swelling. Negative for gait problem, myalgias and neck pain.  Skin: Positive for rash. Negative for wound.  Neurological: Negative for dizziness and numbness.     Physical Exam Updated Vital Signs BP 96/60   Pulse 97   Temp 98.1 F (36.7 C) (Oral)   Resp 20   Ht 6\' 1"  (1.854 m)   Wt 79.4 kg   SpO2 98%   BMI 23.09 kg/m   Physical Exam  Constitutional: He is oriented to person, place, and time. He appears well-developed and well-nourished.  HENT:  Head: Normocephalic and atraumatic.  Eyes: Conjunctivae are normal.  Neck: Normal range of motion.  Cardiovascular: Normal rate.  Pulmonary/Chest: Effort normal. No respiratory distress.  Musculoskeletal:  Examination of the left lower extremity shows patient has good range of motion of the hip with no discomfort.  Patient is able to straight leg raise.  Active knee range of motion is 10 to 55 degrees.  He is stable to valgus varus stress testing.  There is no palpable defect of quads tendon.  10  x 4 area of mild erythema along the proximal incision site with small suture abscess noted.  There is no fluctuance.  Patient is minimally tender to palpation.  Mild amount of swelling but no significant effusion.  There is no swelling or edema throughout the lower leg or calf.  2+ dorsalis pedis pulses.  Neurological: He is alert and oriented to person, place, and time.  Skin: Skin is warm. No rash noted.  Psychiatric: He has a normal mood and affect. His behavior is normal. Thought content normal.     ED Treatments / Results  Labs (all labs ordered are listed, but only abnormal results are displayed) Labs Reviewed  BASIC METABOLIC PANEL - Abnormal; Notable for the following components:      Result Value   Glucose, Bld 112 (*)    All other components within normal limits  CBC - Abnormal; Notable for the following components:   RBC 3.99 (*)    Hemoglobin 11.5 (*)    HCT 35.3 (*)    All other components within normal limits    EKG None  Radiology Dg Knee Complete 4 Views Left  Result Date: 12/04/2017 CLINICAL DATA:  Pain with recent knee replacement EXAM: LEFT KNEE - COMPLETE 4+ VIEW COMPARISON:  10/30/2017 FINDINGS: Status post left knee replacement. No acute displaced fracture or malalignment. Suprapatellar soft tissue swelling with moderate to large knee effusion. IMPRESSION: Status post left knee replacement without acute displaced fracture. Marked suprapatellar soft tissue swelling with knee effusion. Electronically Signed   By: Jasmine PangKim  Fujinaga M.D.   On: 12/04/2017 17:28    Procedures Procedures (including critical care time)  Medications Ordered in ED Medications  cephALEXin (KEFLEX) capsule 500 mg (500 mg Oral Given 12/04/17 1843)  HYDROcodone-acetaminophen (NORCO/VICODIN) 5-325 MG per tablet 1 tablet (1 tablet Oral Given 12/04/17 1843)     Initial Impression / Assessment and Plan / ED Course  I have reviewed the triage vital signs and the nursing notes.  Pertinent  labs & imaging results that were available during my  care of the patient were reviewed by me and considered in my medical decision making (see chart for details).     57 year old male complaining of acute left knee pain today after feeling a pop when he was ambulating.  He is status post left total knee arthroplasty from 10/30/2017.  X-rays show no evidence of acute bony abnormality.  Physical exam indicates no rupture of patellar tendon or quad tendon.  He does have mild mild swelling and mild to moderate erythema along the proximal incision site and he describes a suture abscess that he popped yesterday with a small pinpoint amount of pus.  He does not appear to have a septic joint.  He is afebrile and no evidence of leukocytosis.  We will place patient on cephalexin for mild cellulitis along the proximal incision site.  He is encouraged to work on gentle knee range of motion.  He will continue with his Polar Care unit.  Call orthopedics tomorrow to schedule follow-up appointment.  Final Clinical Impressions(s) / ED Diagnoses   Final diagnoses:  Status post revision of total knee, left  Pain due to total left knee replacement, initial encounter [facility])  Cellulitis of left knee    ED Discharge Orders         Ordered    cephALEXin (KEFLEX) 500 MG capsule  4 times daily     12/04/17 1853    HYDROcodone-acetaminophen (NORCO) 5-325 MG tablet  Every 8 hours PRN     12/04/17 1853           Ronnette Juniper 12/04/17 1909    Myrna Blazer, MD 12/04/17 [phone]

## 2017-12-04 NOTE — ED Triage Notes (Signed)
First Nurse Note:  States had knee surgery on 10/21.  Felt a "pop" early today/  C/O pain to knee.  Surgery done by Dr. Hyacinth MeekerMiller.

## 2017-12-04 NOTE — ED Notes (Signed)
See triage note  States he had knee replacement 10/21  States knee has been red and swollen No drainage noted  Knee is warm to touch  States he heard a "pop" today while walking  Now having increased pain

## 2017-12-30 ENCOUNTER — Emergency Department: Payer: Medicaid Other

## 2017-12-30 ENCOUNTER — Inpatient Hospital Stay
Admission: EM | Admit: 2017-12-30 | Discharge: 2018-01-04 | DRG: 485 | Disposition: A | Discharge status: Home Health | Payer: Medicaid Other | Attending: Internal Medicine | Admitting: Internal Medicine

## 2017-12-30 DIAGNOSIS — M25562 Pain in left knee: Secondary | ICD-10-CM

## 2017-12-30 DIAGNOSIS — A419 Sepsis, unspecified organism: Secondary | ICD-10-CM | POA: Diagnosis present

## 2017-12-30 DIAGNOSIS — Z87891 Personal history of nicotine dependence: Secondary | ICD-10-CM

## 2017-12-30 DIAGNOSIS — Z791 Long term (current) use of non-steroidal anti-inflammatories (NSAID): Secondary | ICD-10-CM

## 2017-12-30 DIAGNOSIS — Z87442 Personal history of urinary calculi: Secondary | ICD-10-CM

## 2017-12-30 DIAGNOSIS — F319 Bipolar disorder, unspecified: Secondary | ICD-10-CM | POA: Diagnosis present

## 2017-12-30 DIAGNOSIS — Y831 Surgical operation with implant of artificial internal device as the cause of abnormal reaction of the patient, or of later complication, without mention of misadventure at the time of the procedure: Secondary | ICD-10-CM | POA: Diagnosis present

## 2017-12-30 DIAGNOSIS — Z79899 Other long term (current) drug therapy: Secondary | ICD-10-CM

## 2017-12-30 DIAGNOSIS — K219 Gastro-esophageal reflux disease without esophagitis: Secondary | ICD-10-CM | POA: Diagnosis present

## 2017-12-30 DIAGNOSIS — M25551 Pain in right hip: Secondary | ICD-10-CM | POA: Diagnosis present

## 2017-12-30 DIAGNOSIS — E871 Hypo-osmolality and hyponatremia: Secondary | ICD-10-CM | POA: Diagnosis present

## 2017-12-30 DIAGNOSIS — R739 Hyperglycemia, unspecified: Secondary | ICD-10-CM | POA: Diagnosis present

## 2017-12-30 DIAGNOSIS — R509 Fever, unspecified: Secondary | ICD-10-CM

## 2017-12-30 DIAGNOSIS — G2581 Restless legs syndrome: Secondary | ICD-10-CM | POA: Diagnosis present

## 2017-12-30 DIAGNOSIS — M19042 Primary osteoarthritis, left hand: Secondary | ICD-10-CM | POA: Diagnosis present

## 2017-12-30 DIAGNOSIS — E860 Dehydration: Secondary | ICD-10-CM | POA: Diagnosis present

## 2017-12-30 DIAGNOSIS — M19041 Primary osteoarthritis, right hand: Secondary | ICD-10-CM | POA: Diagnosis present

## 2017-12-30 DIAGNOSIS — Z96653 Presence of artificial knee joint, bilateral: Secondary | ICD-10-CM | POA: Diagnosis present

## 2017-12-30 DIAGNOSIS — M00062 Staphylococcal arthritis, left knee: Secondary | ICD-10-CM | POA: Diagnosis present

## 2017-12-30 DIAGNOSIS — E861 Hypovolemia: Secondary | ICD-10-CM | POA: Diagnosis present

## 2017-12-30 DIAGNOSIS — A4101 Sepsis due to Methicillin susceptible Staphylococcus aureus: Secondary | ICD-10-CM | POA: Diagnosis present

## 2017-12-30 DIAGNOSIS — Z818 Family history of other mental and behavioral disorders: Secondary | ICD-10-CM

## 2017-12-30 DIAGNOSIS — F4310 Post-traumatic stress disorder, unspecified: Secondary | ICD-10-CM | POA: Diagnosis present

## 2017-12-30 DIAGNOSIS — Z823 Family history of stroke: Secondary | ICD-10-CM

## 2017-12-30 DIAGNOSIS — Z885 Allergy status to narcotic agent status: Secondary | ICD-10-CM

## 2017-12-30 DIAGNOSIS — E785 Hyperlipidemia, unspecified: Secondary | ICD-10-CM | POA: Diagnosis present

## 2017-12-30 DIAGNOSIS — D638 Anemia in other chronic diseases classified elsewhere: Secondary | ICD-10-CM | POA: Diagnosis present

## 2017-12-30 DIAGNOSIS — Z8619 Personal history of other infectious and parasitic diseases: Secondary | ICD-10-CM

## 2017-12-30 DIAGNOSIS — L03116 Cellulitis of left lower limb: Secondary | ICD-10-CM | POA: Diagnosis present

## 2017-12-30 DIAGNOSIS — F419 Anxiety disorder, unspecified: Secondary | ICD-10-CM | POA: Diagnosis present

## 2017-12-30 DIAGNOSIS — T8454XA Infection and inflammatory reaction due to internal left knee prosthesis, initial encounter: Principal | ICD-10-CM | POA: Diagnosis present

## 2017-12-30 DIAGNOSIS — I10 Essential (primary) hypertension: Secondary | ICD-10-CM | POA: Diagnosis present

## 2017-12-30 DIAGNOSIS — Z7982 Long term (current) use of aspirin: Secondary | ICD-10-CM

## 2017-12-30 LAB — COMPREHENSIVE METABOLIC PANEL
ALK PHOS: 82 U/L (ref 38–126)
ALT: 15 U/L (ref 0–44)
AST: 23 U/L (ref 15–41)
Albumin: 3.4 g/dL — ABNORMAL LOW (ref 3.5–5.0)
Anion gap: 11 (ref 5–15)
BILIRUBIN TOTAL: 0.6 mg/dL (ref 0.3–1.2)
BUN: 13 mg/dL (ref 6–20)
CALCIUM: 9.1 mg/dL (ref 8.9–10.3)
CO2: 17 mmol/L — AB (ref 22–32)
Chloride: 105 mmol/L (ref 98–111)
Creatinine, Ser: 0.93 mg/dL (ref 0.61–1.24)
GFR calc Af Amer: >60 mL/min (ref >60)
GFR calc non Af Amer: >60 mL/min (ref >60)
GLUCOSE: 142 mg/dL — AB (ref 70–99)
Potassium: 3.6 mmol/L (ref 3.5–5.1)
SODIUM: 133 mmol/L — AB (ref 135–145)
Total Protein: 7.9 g/dL (ref 6.5–8.1)

## 2017-12-30 LAB — CBC WITH DIFFERENTIAL/PLATELET
Abs Immature Granulocytes: 0.03 10*3/uL (ref 0.00–0.07)
Basophils Absolute: 0 10*3/uL (ref 0.0–0.1)
Basophils Relative: 0 %
EOS ABS: 0 10*3/uL (ref 0.0–0.5)
Eosinophils Relative: 0 %
HCT: 32.9 % — ABNORMAL LOW (ref 39.0–52.0)
Hemoglobin: 10.7 g/dL — ABNORMAL LOW (ref 13.0–17.0)
IMMATURE GRANULOCYTES: 0 %
Lymphocytes Relative: 11 %
Lymphs Abs: 0.7 10*3/uL (ref 0.7–4.0)
MCH: 27.2 pg (ref 26.0–34.0)
MCHC: 32.5 g/dL (ref 30.0–36.0)
MCV: 83.5 fL (ref 80.0–100.0)
MONO ABS: 0.4 10*3/uL (ref 0.1–1.0)
MONOS PCT: 6 %
NEUTROS PCT: 83 %
Neutro Abs: 5.5 10*3/uL (ref 1.7–7.7)
Platelets: 236 10*3/uL (ref 150–400)
RBC: 3.94 MIL/uL — ABNORMAL LOW (ref 4.22–5.81)
RDW: 15.1 % (ref 11.5–15.5)
WBC: 6.7 10*3/uL (ref 4.0–10.5)
nRBC: 0 % (ref 0.0–0.2)

## 2017-12-30 LAB — LACTIC ACID, PLASMA: Lactic Acid, Venous: 2.5 mmol/L (ref 0.5–1.9)

## 2017-12-30 MED ORDER — HYDROCODONE-ACETAMINOPHEN 5-325 MG PO TABS
2.0000 | ORAL_TABLET | Freq: Once | ORAL | Status: AC
  Administered 2017-12-30: 2 via ORAL
  Filled 2017-12-30: qty 2

## 2017-12-30 MED ORDER — SODIUM CHLORIDE 0.9 % IV BOLUS
1000.0000 mL | Freq: Once | INTRAVENOUS | Status: AC
  Administered 2017-12-31: 1000 mL via INTRAVENOUS

## 2017-12-30 MED ORDER — LIDOCAINE HCL (PF) 1 % IJ SOLN
5.0000 mL | Freq: Once | INTRAMUSCULAR | Status: AC
  Administered 2017-12-31: 5 mL via INTRADERMAL
  Filled 2017-12-30: qty 5

## 2017-12-30 NOTE — ED Notes (Signed)
Patient to waiting room via wheelchair from home by EMS.  Per EMS patient had knee replacement in October 2019 and has has repeated infections to that area since.  Recently just finished a course of antibiotics.  EMS vital signs -    Temp 98.5; hr 104; bp 133/86; pulse oxi 98% on room air, cbg 138.  Area to knee warm to touch

## 2017-12-30 NOTE — ED Notes (Signed)
ED Provider at bedside. 

## 2017-12-30 NOTE — ED Triage Notes (Addendum)
Patient reports hx of left TKN in October. Patient c/o pain, redness, warmth, and golf ball sized mass/swelling medial knee. Patient c/o right hip pain

## 2017-12-30 NOTE — ED Notes (Signed)
Danelle EarthlyNoel RN to bedside to attempt ultrasound IV

## 2017-12-30 NOTE — ED Provider Notes (Signed)
[facility] Department Emergency Department Provider Note    None    (approximate)  I have reviewed the triage vital signs and the nursing notes.   HISTORY  Chief Complaint Knee Pain and Hip Pain    HPI [name] is a 57 y.o. male below listed past medical history presents the ER for several weeks of persistent left knee pain status post knee replacement.  States he is also had infections to that area related to wound.  Just recently completed antibiotics.  States his primary complaint tonight is persistent pain as well as development of right hip pain where he feels like he is favoring the right hip too much.  Denies any fevers.  No nausea or vomiting.  No chest pain or shortness of breath.    Past Medical History:  Diagnosis Date  . Anxiety   . Arthritis    knees and hands  . Bipolar disorder (HCC)   . Depression   . GERD (gastroesophageal reflux disease)   . Hepatitis    HEP "C"  . History of kidney stones   . Hypertension   . Kidney stones   . Pericarditis 05/2015   a. echo 5/17: EF 60-65%, no RWMA, LV dias fxn nl, LA mildly dilated, RV sys fxn nl, PASP nl, moderate sized circumferential pericardial effusion was identified, 2.12 cm around the LV free wall, <1 cm around the RV free wall. Features were not c/w tamponade physiology  . PTSD (post-traumatic stress disorder)    Witnessed brother's suicide.  Marland Kitchen Restless leg syndrome   . Syncope    Family History  Problem Relation Age of Onset  . CVA Mother        deceased at age 36  . Depression Brother        Died by suicide at age 4   Past Surgical History:  Procedure Laterality Date  . CYSTOSCOPY WITH URETEROSCOPY AND STENT PLACEMENT    . ESOPHAGOGASTRODUODENOSCOPY N/A 01/11/2016   Procedure: ESOPHAGOGASTRODUODENOSCOPY (EGD);  Surgeon: Charlott Rakes, MD;  Location: [facility] ENDOSCOPY;  Service: Endoscopy;  Laterality: N/A;  . JOINT REPLACEMENT Right    TKR  . KNEE ARTHROSCOPY Right 06/25/2014   Procedure: ARTHROSCOPY KNEE;  Surgeon: Deeann Saint, MD;  Location: [facility] ORS;  Service: Orthopedics;  Laterality: Right;  partial arthroscopic medial menisectomy  . TOTAL KNEE ARTHROPLASTY Right 04/22/2015   Procedure: TOTAL KNEE ARTHROPLASTY;  Surgeon: Deeann Saint, MD;  Location: [facility] ORS;  Service: Orthopedics;  Laterality: Right;  . TOTAL KNEE ARTHROPLASTY Left 10/30/2017   Procedure: TOTAL KNEE ARTHROPLASTY;  Surgeon: Deeann Saint, MD;  Location: [facility] ORS;  Service: Orthopedics;  Laterality: Left;   Patient Active Problem List   Diagnosis Date Noted  . History of esophagogastroduodenoscopy (EGD) 01/12/2016  . Esophagitis 01/12/2016  . Hypokalemia 01/12/2016  . Leukocytosis 01/12/2016  . Nausea and vomiting 01/11/2016  . Odynophagia 01/11/2016  . Abdominal pain 01/06/2016  . Anemia 06/02/2015  . Chest pain with moderate risk for cardiac etiology   . Coronary artery disease involving native coronary artery of native heart with angina pectoris (HCC)   . Hyperlipidemia   . Pleuritic chest pain 06/01/2015  . Pericarditis with effusion 05/13/2015  . Chest pain at rest 05/12/2015  . Dyspnea 05/12/2015  . SIRS (systemic inflammatory response syndrome) (HCC) 05/12/2015  . Total knee replacement status 04/22/2015      Prior to Admission medications   Medication Sig Start Date End Date Taking? Authorizing Provider  aspirin EC 325  MG tablet Take 1 tablet (325 mg total) by mouth daily. 11/03/17   Deeann SaintMiller, Howard, MD  benztropine (COGENTIN) 0.5 MG tablet Take 1 tablet (0.5 mg total) by mouth 2 (two) times daily as needed for tremors. Patient taking differently: Take 1 mg by mouth 2 (two) times daily as needed for tremors.  04/25/15   Deeann SaintMiller, Howard, MD  divalproex (DEPAKOTE) 500 MG DR tablet Take 500 mg by mouth 2 (two) times daily.    [provider]  enalapril (VASOTEC) 10 MG tablet Take 10 mg by mouth daily.    [provider]  FLUoxetine (PROZAC) 20 MG capsule Take  80 mg by mouth daily.     [provider]  gabapentin (NEURONTIN) 400 MG capsule Take 1 capsule (400 mg total) by mouth 3 (three) times daily. 11/03/17   Deeann SaintMiller, Howard, MD  gabapentin (NEURONTIN) 400 MG capsule Take 1 capsule (400 mg total) by mouth 2 (two) times daily. 11/03/17   Deeann SaintMiller, Howard, MD  HYDROcodone-acetaminophen (NORCO) 5-325 MG tablet Take 1 tablet by mouth every 8 (eight) hours as needed for moderate pain. 12/04/17   Evon SlackGaines, Thomas C, PA-C  meloxicam (MOBIC) 15 MG tablet Take 1 tablet (15 mg total) by mouth daily. 11/03/17   Deeann SaintMiller, Howard, MD  pantoprazole (PROTONIX) 40 MG tablet Take 1 tablet (40 mg total) by mouth 2 (two) times daily. Patient not taking: Reported on 12/07/2016 06/02/15   Enid BaasKalisetti, Radhika, MD  QUEtiapine (SEROQUEL) 50 MG tablet Take 50 mg by mouth 4 (four) times daily as needed (for agitation).     [provider]  REXULTI 2 MG TABS TK 1 T PO HS 11/22/17   [provider]  rosuvastatin (CRESTOR) 10 MG tablet Take 1 tablet (10 mg total) by mouth daily. Patient not taking: Reported on 02/27/2017 06/02/15   Enid BaasKalisetti, Radhika, MD  SEROQUEL XR 400 MG 24 hr tablet Take 400 mg by mouth at bedtime.     [provider]  sucralfate (CARAFATE) 1 g tablet Take 1 tablet (1 g total) by mouth 4 (four) times daily -  with meals and at bedtime. Patient taking differently: Take 1 g by mouth daily as needed (acid reflux).  01/12/16   Katharina CaperVaickute, Rima, MD  zolpidem (AMBIEN) 10 MG tablet Take 10 mg by mouth at bedtime as needed for sleep.    [provider]    Allergies Toradol [ketorolac tromethamine]    Social History Social History   Tobacco Use  . Smoking status: Former Smoker    Packs/day: 0.75    Years: 20.00    Pack years: 15.00    Types: Cigarettes    Last attempt to quit: 05/16/1984    Years since quitting: 33.6  . Smokeless tobacco: Never Used  Substance Use Topics  . Alcohol use: Yes    Comment: occ  . Drug use: No      Review of Systems Patient denies headaches, rhinorrhea, blurry vision, numbness, shortness of breath, chest pain, edema, cough, abdominal pain, nausea, vomiting, diarrhea, dysuria, fevers, rashes or hallucinations unless otherwise stated above in HPI. ____________________________________________   PHYSICAL EXAM:  VITAL SIGNS: Vitals:   12/30/17 2335 12/30/17 2345  BP: 124/79 121/78  Pulse: (!) 103 100  Resp: 19   Temp:    SpO2: 98% 96%    Constitutional: Alert and oriented.  Eyes: Conjunctivae are normal.  Head: Atraumatic. Nose: No congestion/rhinnorhea. Mouth/Throat: Mucous membranes are moist.   Neck: No stridor. Painless ROM.  Cardiovascular: Normal rate,  regular rhythm. Grossly normal heart sounds.  Good peripheral circulation. Respiratory: Normal respiratory effort.  No retractions. Lungs CTAB. Gastrointestinal: Soft and nontender. No distention. No abdominal bruits. No CVA tenderness. Genitourinary: deferred Musculoskeletal: No overlying erythema of the left knee.  Trace effusion.  Surgical incision appears clean dry and intact.  No overlying warmth.  No obvious deformity.  There is also pain with palpation of the right greater trochanter without overlying erythema or warmth.   Neurologic:  Normal speech and language. No gross focal neurologic deficits are appreciated. No facial droop Skin:  Skin is warm, dry and intact. No rash noted. Psychiatric: Mood and affect are normal. Speech and behavior are normal.  ____________________________________________   LABS (all labs ordered are listed, but only abnormal results are displayed)  Results for orders placed or performed during the hospital encounter of 12/30/17 (from the past 24 hour(s))  Lactic acid, plasma     Status: Abnormal   Collection Time: 12/30/17 10:51 PM  Result Value Ref Range   Lactic Acid, Venous 2.5 (HH) 0.5 - 1.9 mmol/L  Comprehensive metabolic panel     Status: Abnormal   Collection Time:  12/30/17 10:51 PM  Result Value Ref Range   Sodium 133 (L) 135 - 145 mmol/L   Potassium 3.6 3.5 - 5.1 mmol/L   Chloride 105 98 - 111 mmol/L   CO2 17 (L) 22 - 32 mmol/L   Glucose, Bld 142 (H) 70 - 99 mg/dL   BUN 13 6 - 20 mg/dL   Creatinine, Ser 4.09 0.61 - 1.24 mg/dL   Calcium 9.1 8.9 - 81.1 mg/dL   Total Protein 7.9 6.5 - 8.1 g/dL   Albumin 3.4 (L) 3.5 - 5.0 g/dL   AST 23 15 - 41 U/L   ALT 15 0 - 44 U/L   Alkaline Phosphatase 82 38 - 126 U/L   Total Bilirubin 0.6 0.3 - 1.2 mg/dL   GFR calc non Af Amer >60 >60 mL/min   GFR calc Af Amer >60 >60 mL/min   Anion gap 11 5 - 15  CBC with Differential     Status: Abnormal   Collection Time: 12/30/17 10:51 PM  Result Value Ref Range   WBC 6.7 4.0 - 10.5 K/uL   RBC 3.94 (L) 4.22 - 5.81 MIL/uL   Hemoglobin 10.7 (L) 13.0 - 17.0 g/dL   HCT 91.4 (L) 78.2 - 95.6 %   MCV 83.5 80.0 - 100.0 fL   MCH 27.2 26.0 - 34.0 pg   MCHC 32.5 30.0 - 36.0 g/dL   RDW 21.3 08.6 - 57.8 %   Platelets 236 150 - 400 K/uL   nRBC 0.0 0.0 - 0.2 %   Neutrophils Relative % 83 %   Neutro Abs 5.5 1.7 - 7.7 K/uL   Lymphocytes Relative 11 %   Lymphs Abs 0.7 0.7 - 4.0 K/uL   Monocytes Relative 6 %   Monocytes Absolute 0.4 0.1 - 1.0 K/uL   Eosinophils Relative 0 %   Eosinophils Absolute 0.0 0.0 - 0.5 K/uL   Basophils Relative 0 %   Basophils Absolute 0.0 0.0 - 0.1 K/uL   Immature Granulocytes 0 %   Abs Immature Granulocytes 0.03 0.00 - 0.07 K/uL   ____________________________________________ ____________________________________________  RADIOLOGY  I personally reviewed all radiographic images ordered to evaluate for the above acute complaints and reviewed radiology reports and findings.  These findings were personally discussed with the patient.  Please see medical record for radiology report.  ____________________________________________  PROCEDURES  Procedure(s) performed:  Marland Kitchen.Marland Kitchen.Incision and Drainage Date/Time: 12/31/2017 12:54 AM Performed by:  Willy Eddyobinson, Breianna Delfino, MD Authorized by: Willy Eddyobinson, Vernida Mcnicholas, MD   Consent:    Consent obtained:  Verbal   Consent given by:  Patient   Risks discussed:  Bleeding, infection, incomplete drainage and pain   Alternatives discussed:  Alternative treatment, delayed treatment and observation Location:    Type:  Fluid collection   Size:  2cm   Location:  Lower extremity   Lower extremity location:  Leg   Leg location:  L lower leg Pre-procedure details:    Skin preparation:  Betadine Anesthesia (see MAR for exact dosages):    Anesthesia method:  Local infiltration   Local anesthetic:  Lidocaine 1% w/o epi Procedure type:    Complexity:  Simple Procedure details:    Needle aspiration: yes     Needle size:  22 G   Incision depth:  Subcutaneous   Drainage:  Purulent   Drainage amount:  Moderate Post-procedure details:    Patient tolerance of procedure:  Tolerated well, no immediate complications      Critical Care performed: no ____________________________________________   INITIAL IMPRESSION / ASSESSMENT AND PLAN / ED COURSE  Pertinent labs & imaging results that were available during my care of the patient were reviewed by me and considered in my medical decision making (see chart for details).   DDX: fracture, dislocation, cellulitis, septic arthritism abscess, bursitis, tendonitis  [name] is a 57 y.o. who presents to the ED with symptoms as described above.  Patient with complex recent past medical history.  Will obtain blood work and x-rays.  Clinical Course as of Jan 01 57  Sat Dec 30, 2017  2336 Patient reportedly febrile in triage.  Does have elevated lactate.  Bedside ultrasound does show fluid collection in the left medial knee inferior to the knee joint.  No significant effusion but there is surrounding soft tissue swelling.  Will consult orthopedics for further recommendations.   [PR]  Sun Dec 31, 2017  [id number] Consulted with Dr. Lesleigh NoeKosinski orthopedics who agrees  with plan for admission for IV antibiotics.  Recommends not performing diagnostic arthrocentesis at this time given absence of laboratory data.  Does agree with aspiration of fluctuant fluid collection inferior to the knee to evaluate for abscess or infection.   [PR]    Clinical Course User Index [PR] Willy Eddyobinson, Nashonda Limberg, MD     As part of my medical decision making, I reviewed the following data within the electronic MEDICAL RECORD NUMBER Nursing notes reviewed and incorporated, Labs reviewed, notes from prior ED visits.   ____________________________________________   FINAL CLINICAL IMPRESSION(S) / ED DIAGNOSES  Final diagnoses:  Fever, unspecified fever cause  Acute pain of left knee  Acute right hip pain      NEW MEDICATIONS STARTED DURING THIS VISIT:  New Prescriptions   No medications on file     Note:  This document was prepared using Dragon voice recognition software and may include unintentional dictation errors.    Willy Eddyobinson, Bethann Qualley, MD 12/31/17 [id number]

## 2017-12-30 NOTE — ED Notes (Signed)
Pt taken to treatment room via wheelchair; in route pt says he feels like he may have a fever now-has not checked temp at home; pt's temp checked in room and currently 98.7

## 2017-12-31 ENCOUNTER — Inpatient Hospital Stay: Payer: Medicaid Other

## 2017-12-31 ENCOUNTER — Encounter: Payer: Self-pay | Admitting: Internal Medicine

## 2017-12-31 ENCOUNTER — Other Ambulatory Visit: Payer: Self-pay

## 2017-12-31 DIAGNOSIS — K210 Gastro-esophageal reflux disease with esophagitis: Secondary | ICD-10-CM | POA: Diagnosis not present

## 2017-12-31 DIAGNOSIS — F419 Anxiety disorder, unspecified: Secondary | ICD-10-CM | POA: Diagnosis present

## 2017-12-31 DIAGNOSIS — E860 Dehydration: Secondary | ICD-10-CM | POA: Diagnosis present

## 2017-12-31 DIAGNOSIS — D638 Anemia in other chronic diseases classified elsewhere: Secondary | ICD-10-CM | POA: Diagnosis present

## 2017-12-31 DIAGNOSIS — Z87442 Personal history of urinary calculi: Secondary | ICD-10-CM | POA: Diagnosis not present

## 2017-12-31 DIAGNOSIS — Z87891 Personal history of nicotine dependence: Secondary | ICD-10-CM | POA: Diagnosis not present

## 2017-12-31 DIAGNOSIS — M19041 Primary osteoarthritis, right hand: Secondary | ICD-10-CM | POA: Diagnosis present

## 2017-12-31 DIAGNOSIS — G2581 Restless legs syndrome: Secondary | ICD-10-CM | POA: Diagnosis present

## 2017-12-31 DIAGNOSIS — A4101 Sepsis due to Methicillin susceptible Staphylococcus aureus: Secondary | ICD-10-CM | POA: Diagnosis present

## 2017-12-31 DIAGNOSIS — I10 Essential (primary) hypertension: Secondary | ICD-10-CM | POA: Diagnosis present

## 2017-12-31 DIAGNOSIS — M19042 Primary osteoarthritis, left hand: Secondary | ICD-10-CM | POA: Diagnosis present

## 2017-12-31 DIAGNOSIS — Z96653 Presence of artificial knee joint, bilateral: Secondary | ICD-10-CM | POA: Diagnosis present

## 2017-12-31 DIAGNOSIS — Z8619 Personal history of other infectious and parasitic diseases: Secondary | ICD-10-CM | POA: Diagnosis not present

## 2017-12-31 DIAGNOSIS — B9561 Methicillin susceptible Staphylococcus aureus infection as the cause of diseases classified elsewhere: Secondary | ICD-10-CM | POA: Diagnosis not present

## 2017-12-31 DIAGNOSIS — T8454XA Infection and inflammatory reaction due to internal left knee prosthesis, initial encounter: Secondary | ICD-10-CM | POA: Diagnosis present

## 2017-12-31 DIAGNOSIS — Y831 Surgical operation with implant of artificial internal device as the cause of abnormal reaction of the patient, or of later complication, without mention of misadventure at the time of the procedure: Secondary | ICD-10-CM | POA: Diagnosis present

## 2017-12-31 DIAGNOSIS — E861 Hypovolemia: Secondary | ICD-10-CM | POA: Diagnosis present

## 2017-12-31 DIAGNOSIS — K219 Gastro-esophageal reflux disease without esophagitis: Secondary | ICD-10-CM | POA: Diagnosis present

## 2017-12-31 DIAGNOSIS — L03116 Cellulitis of left lower limb: Secondary | ICD-10-CM | POA: Diagnosis present

## 2017-12-31 DIAGNOSIS — E871 Hypo-osmolality and hyponatremia: Secondary | ICD-10-CM | POA: Diagnosis present

## 2017-12-31 DIAGNOSIS — F1911 Other psychoactive substance abuse, in remission: Secondary | ICD-10-CM | POA: Diagnosis not present

## 2017-12-31 DIAGNOSIS — A419 Sepsis, unspecified organism: Secondary | ICD-10-CM | POA: Diagnosis present

## 2017-12-31 DIAGNOSIS — Z823 Family history of stroke: Secondary | ICD-10-CM | POA: Diagnosis not present

## 2017-12-31 DIAGNOSIS — E785 Hyperlipidemia, unspecified: Secondary | ICD-10-CM | POA: Diagnosis present

## 2017-12-31 DIAGNOSIS — M25562 Pain in left knee: Secondary | ICD-10-CM | POA: Diagnosis present

## 2017-12-31 DIAGNOSIS — F4310 Post-traumatic stress disorder, unspecified: Secondary | ICD-10-CM | POA: Diagnosis present

## 2017-12-31 DIAGNOSIS — R739 Hyperglycemia, unspecified: Secondary | ICD-10-CM | POA: Diagnosis present

## 2017-12-31 DIAGNOSIS — M25551 Pain in right hip: Secondary | ICD-10-CM | POA: Diagnosis present

## 2017-12-31 DIAGNOSIS — M00062 Staphylococcal arthritis, left knee: Secondary | ICD-10-CM | POA: Diagnosis present

## 2017-12-31 DIAGNOSIS — F319 Bipolar disorder, unspecified: Secondary | ICD-10-CM | POA: Diagnosis present

## 2017-12-31 LAB — BLOOD CULTURE ID PANEL (REFLEXED)
Acinetobacter baumannii: NOT DETECTED
Candida albicans: NOT DETECTED
Candida glabrata: NOT DETECTED
Candida krusei: NOT DETECTED
Candida parapsilosis: NOT DETECTED
Candida tropicalis: NOT DETECTED
ENTEROBACTERIACEAE SPECIES: NOT DETECTED
Enterobacter cloacae complex: NOT DETECTED
Enterococcus species: NOT DETECTED
Escherichia coli: NOT DETECTED
Haemophilus influenzae: NOT DETECTED
Klebsiella oxytoca: NOT DETECTED
Klebsiella pneumoniae: NOT DETECTED
LISTERIA MONOCYTOGENES: NOT DETECTED
METHICILLIN RESISTANCE: NOT DETECTED
Neisseria meningitidis: NOT DETECTED
Proteus species: NOT DETECTED
Pseudomonas aeruginosa: NOT DETECTED
STREPTOCOCCUS SPECIES: NOT DETECTED
Serratia marcescens: NOT DETECTED
Staphylococcus aureus (BCID): NOT DETECTED
Staphylococcus species: DETECTED — AB
Streptococcus agalactiae: NOT DETECTED
Streptococcus pneumoniae: NOT DETECTED
Streptococcus pyogenes: NOT DETECTED

## 2017-12-31 LAB — MAGNESIUM: MAGNESIUM: 1.8 mg/dL (ref 1.7–2.4)

## 2017-12-31 LAB — PHOSPHORUS: Phosphorus: <1 mg/dL — CL (ref 2.5–4.6)

## 2017-12-31 LAB — HEMOGLOBIN A1C
Hgb A1c MFr Bld: 5.2 % (ref 4.8–5.6)
Mean Plasma Glucose: 102.54 mg/dL

## 2017-12-31 LAB — C-REACTIVE PROTEIN: CRP: 13.7 mg/dL — AB (ref <1.0)

## 2017-12-31 LAB — SEDIMENTATION RATE: Sed Rate: 89 mm/hr — ABNORMAL HIGH (ref 0–20)

## 2017-12-31 LAB — PREALBUMIN: Prealbumin: 16.5 mg/dL — ABNORMAL LOW (ref 18–38)

## 2017-12-31 LAB — LACTIC ACID, PLASMA: Lactic Acid, Venous: 1 mmol/L (ref 0.5–1.9)

## 2017-12-31 MED ORDER — SODIUM CHLORIDE 0.9 % IV SOLN
2.0000 g | INTRAVENOUS | Status: DC
  Administered 2017-12-31: 2 g via INTRAVENOUS
  Filled 2017-12-31 (×2): qty 20

## 2017-12-31 MED ORDER — IOPAMIDOL (ISOVUE-300) INJECTION 61%
100.0000 mL | Freq: Once | INTRAVENOUS | Status: AC | PRN
  Administered 2017-12-31: 100 mL via INTRAVENOUS

## 2017-12-31 MED ORDER — QUETIAPINE FUMARATE 25 MG PO TABS: 50.0000 mg | ORAL_TABLET | Freq: Four times a day (QID) | ORAL | Status: DC | PRN

## 2017-12-31 MED ORDER — VANCOMYCIN HCL IN DEXTROSE 1-5 GM/200ML-% IV SOLN
1000.0000 mg | Freq: Once | INTRAVENOUS | Status: AC
  Administered 2017-12-31: 1000 mg via INTRAVENOUS
  Filled 2017-12-31: qty 200

## 2017-12-31 MED ORDER — DIVALPROEX SODIUM 500 MG PO DR TAB
500.0000 mg | DELAYED_RELEASE_TABLET | Freq: Two times a day (BID) | ORAL | Status: DC
  Administered 2017-12-31 – 2018-01-04 (×9): 500 mg via ORAL
  Filled 2017-12-31 (×12): qty 1

## 2017-12-31 MED ORDER — FLUOXETINE HCL 20 MG PO CAPS
80.0000 mg | ORAL_CAPSULE | Freq: Every day | ORAL | Status: DC
  Administered 2017-12-31 – 2018-01-04 (×4): 80 mg via ORAL
  Filled 2017-12-31 (×5): qty 4

## 2017-12-31 MED ORDER — SUCRALFATE 1 G PO TABS
1.0000 g | ORAL_TABLET | Freq: Three times a day (TID) | ORAL | Status: DC
  Administered 2017-12-31 – 2018-01-01 (×3): 1 g via ORAL
  Filled 2017-12-31 (×5): qty 1

## 2017-12-31 MED ORDER — QUETIAPINE FUMARATE ER 200 MG PO TB24
400.0000 mg | ORAL_TABLET | Freq: Every day | ORAL | Status: DC
  Administered 2017-12-31 – 2018-01-03 (×4): 400 mg via ORAL
  Filled 2017-12-31 (×6): qty 2

## 2017-12-31 MED ORDER — ZOLPIDEM TARTRATE 5 MG PO TABS: 10.0000 mg | ORAL_TABLET | Freq: Every evening | ORAL | Status: DC | PRN

## 2017-12-31 MED ORDER — ACETAMINOPHEN 650 MG RE SUPP: 650.0000 mg | Freq: Four times a day (QID) | RECTAL | Status: DC | PRN

## 2017-12-31 MED ORDER — CEFAZOLIN SODIUM-DEXTROSE 2-4 GM/100ML-% IV SOLN
2.0000 g | Freq: Three times a day (TID) | INTRAVENOUS | Status: DC
  Administered 2017-12-31 – 2018-01-01 (×2): 2 g via INTRAVENOUS
  Filled 2017-12-31 (×4): qty 100

## 2017-12-31 MED ORDER — VANCOMYCIN HCL IN DEXTROSE 1-5 GM/200ML-% IV SOLN
1000.0000 mg | Freq: Three times a day (TID) | INTRAVENOUS | Status: DC
  Administered 2017-12-31 (×2): 1000 mg via INTRAVENOUS
  Filled 2017-12-31 (×5): qty 200

## 2017-12-31 MED ORDER — ONDANSETRON HCL 4 MG PO TABS: 4.0000 mg | ORAL_TABLET | Freq: Four times a day (QID) | ORAL | Status: DC | PRN

## 2017-12-31 MED ORDER — ENOXAPARIN SODIUM 40 MG/0.4ML ~~LOC~~ SOLN: 40.0000 mg | SUBCUTANEOUS | Status: DC

## 2017-12-31 MED ORDER — SODIUM CHLORIDE 0.9 % IV BOLUS
1000.0000 mL | Freq: Once | INTRAVENOUS | Status: AC
  Administered 2017-12-31: 1000 mL via INTRAVENOUS

## 2017-12-31 MED ORDER — ENALAPRIL MALEATE 10 MG PO TABS
10.0000 mg | ORAL_TABLET | Freq: Every day | ORAL | Status: DC
  Filled 2017-12-31: qty 1

## 2017-12-31 MED ORDER — ENOXAPARIN SODIUM 40 MG/0.4ML ~~LOC~~ SOLN
40.0000 mg | SUBCUTANEOUS | Status: DC
  Filled 2017-12-31: qty 0.4

## 2017-12-31 MED ORDER — BISACODYL 5 MG PO TBEC
5.0000 mg | DELAYED_RELEASE_TABLET | Freq: Every day | ORAL | Status: DC | PRN
  Filled 2017-12-31: qty 1

## 2017-12-31 MED ORDER — ROSUVASTATIN CALCIUM 10 MG PO TABS: 10.0000 mg | ORAL_TABLET | Freq: Every day | ORAL | Status: DC

## 2017-12-31 MED ORDER — PANTOPRAZOLE SODIUM 40 MG PO TBEC
40.0000 mg | DELAYED_RELEASE_TABLET | Freq: Two times a day (BID) | ORAL | Status: DC
  Administered 2017-12-31 – 2018-01-01 (×4): 40 mg via ORAL
  Filled 2017-12-31 (×4): qty 1

## 2017-12-31 MED ORDER — ACETAMINOPHEN 325 MG PO TABS: 650.0000 mg | ORAL_TABLET | Freq: Four times a day (QID) | ORAL | Status: DC | PRN

## 2017-12-31 MED ORDER — SENNOSIDES-DOCUSATE SODIUM 8.6-50 MG PO TABS: 1.0000 | ORAL_TABLET | Freq: Every evening | ORAL | Status: DC | PRN

## 2017-12-31 MED ORDER — GABAPENTIN 400 MG PO CAPS
400.0000 mg | ORAL_CAPSULE | Freq: Two times a day (BID) | ORAL | Status: DC
  Administered 2017-12-31 – 2018-01-04 (×9): 400 mg via ORAL
  Filled 2017-12-31 (×10): qty 1

## 2017-12-31 MED ORDER — HYDROMORPHONE HCL 1 MG/ML IJ SOLN: 0.5000 mg | INTRAMUSCULAR | Status: DC | PRN

## 2017-12-31 MED ORDER — ASPIRIN EC 325 MG PO TBEC
325.0000 mg | DELAYED_RELEASE_TABLET | Freq: Every day | ORAL | Status: DC
  Administered 2017-12-31 – 2018-01-01 (×2): 325 mg via ORAL
  Filled 2017-12-31 (×2): qty 1

## 2017-12-31 MED ORDER — ONDANSETRON HCL 4 MG/2ML IJ SOLN: 4.0000 mg | Freq: Four times a day (QID) | INTRAMUSCULAR | Status: DC | PRN

## 2017-12-31 MED ORDER — HYDROMORPHONE HCL 1 MG/ML IJ SOLN
1.0000 mg | INTRAMUSCULAR | Status: DC | PRN
  Administered 2017-12-31 (×6): 1 mg via INTRAVENOUS
  Filled 2017-12-31 (×7): qty 1

## 2017-12-31 MED ORDER — LACTATED RINGERS IV SOLN
INTRAVENOUS | Status: DC
  Administered 2017-12-31: 05:00:00 via INTRAVENOUS

## 2017-12-31 MED ORDER — HYDROCODONE-ACETAMINOPHEN 5-325 MG PO TABS
1.0000 | ORAL_TABLET | Freq: Four times a day (QID) | ORAL | Status: DC | PRN
  Administered 2017-12-31: 1 via ORAL
  Administered 2018-01-01 – 2018-01-04 (×8): 2 via ORAL
  Filled 2017-12-31 (×6): qty 2
  Filled 2017-12-31: qty 1
  Filled 2017-12-31 (×2): qty 2

## 2017-12-31 NOTE — Progress Notes (Signed)
Patient with c/o pain in L knee. Pain meds given as needed for pain control.  L knee red, hot, swollen.  Patient on IV antibiotics.

## 2017-12-31 NOTE — Progress Notes (Signed)
PHARMACY - PHYSICIAN COMMUNICATION CRITICAL VALUE ALERT - BLOOD CULTURE IDENTIFICATION (BCID)  Victor DrapeJames C Giuliani is an 57 y.o. male [facility] with a chief complaint of Sepsis  Assessment:  1/4 bottles Aerobic gram positive cocci with mecA not detected (include suspected source if known)  Name of physician (or Provider) Contacted: Dr. Kathe MarinerJ Patel  Current antibiotics: Ceftriaxone 2 gm every 24 hours and Vancomycin 1 gm every 8 hours.  Changes to prescribed antibiotics recommended:  Cefazolin 2 gm IV every 8 hours Recommendations accepted by provider  Results for orders placed or performed during the hospital encounter of 12/30/17  Blood Culture ID Panel (Reflexed) (Collected: 12/30/2017 10:51 PM)  Result Value Ref Range   Enterococcus species NOT DETECTED NOT DETECTED   Listeria monocytogenes NOT DETECTED NOT DETECTED   Staphylococcus species DETECTED (A) NOT DETECTED   Staphylococcus aureus (BCID) NOT DETECTED NOT DETECTED   Methicillin resistance NOT DETECTED NOT DETECTED   Streptococcus species NOT DETECTED NOT DETECTED   Streptococcus agalactiae NOT DETECTED NOT DETECTED   Streptococcus pneumoniae NOT DETECTED NOT DETECTED   Streptococcus pyogenes NOT DETECTED NOT DETECTED   Acinetobacter baumannii NOT DETECTED NOT DETECTED   Enterobacteriaceae species NOT DETECTED NOT DETECTED   Enterobacter cloacae complex NOT DETECTED NOT DETECTED   Escherichia coli NOT DETECTED NOT DETECTED   Klebsiella oxytoca NOT DETECTED NOT DETECTED   Klebsiella pneumoniae NOT DETECTED NOT DETECTED   Proteus species NOT DETECTED NOT DETECTED   Serratia marcescens NOT DETECTED NOT DETECTED   Haemophilus influenzae NOT DETECTED NOT DETECTED   Neisseria meningitidis NOT DETECTED NOT DETECTED   Pseudomonas aeruginosa NOT DETECTED NOT DETECTED   Candida albicans NOT DETECTED NOT DETECTED   Candida glabrata NOT DETECTED NOT DETECTED   Candida krusei NOT DETECTED NOT DETECTED   Candida parapsilosis NOT DETECTED NOT DETECTED   Candida tropicalis NOT DETECTED NOT DETECTED    Orinda Kennerhris A Milton Streicher, PharmD 12/31/2017  8:34 PM

## 2017-12-31 NOTE — H&P (Addendum)
[employer] [facility] - [place] at [facility]   PATIENT NAME: [name]    MR#:  [id number]  DATE OF BIRTH:  07-09-1960   DATE OF ADMISSION:  12/30/2017  PRIMARY CARE PHYSICIAN: [facility], [facility]   REQUESTING/REFERRING PHYSICIAN: Willy Eddy, MD  CHIEF COMPLAINT:   Chief Complaint  Patient presents with  . Knee Pain  . Hip Pain    HISTORY OF PRESENT ILLNESS:  [name]  is a 57 y.o. male with a known history of HTN, GERD p/w L knee pain. Had L TKR performed by Dr. Hyacinth Meeker on 10/30/2017. As he tells it, he has been, "having issues ever since." He states he noticed a "knot" on the medial aspect of the L knee shortly postoperatively, though he is unsure when. He states after the staples were removed, he started to notice swelling and redness at the incision site. He first says there was pus draining from the site, but then he says there was not pus draining but there was swelling and he thinks the pus was trapped inside. He states the pain has progressively increased. He states he came to the ED several weeks ago (11/25 based on documentation), was Dx w/ L knee cellulitis, and was D/Ced w/ Rx for Keflex and pain medications. He states he has not spoken to Dr. Hyacinth Meeker in the interim, and as such, I do not believe Dr. Hyacinth Meeker was aware of this issue. Pt states he has not had improvement in the L knee pain/swelling/erythema despite taking/completing the course of ABx. Since completion of ABx, pt endorses subjective fever, chills, diaphoresis, night sweats and rigors. He endorses dehydration and decreased urine output. He denies cough, CP/SOB, AP/N/V/D, LH/LOC, urinary symptoms (dysuria, pain, burning, frequency, urgency, hesitancy, hematuria, nocturia, incontinence). States he is a non-smoker. L knee swollen along medial aspect, (+) TTP, s/p . L knee XR (+) soft tissue swelling. U/A (+) fluid collection, s/p aspiration in ED, Cx sent. Pt also c/o R hip pain. XR R hip  (-). SIRS (+).  PAST MEDICAL HISTORY:   Past Medical History:  Diagnosis Date  . Anxiety   . Arthritis    knees and hands  . Bipolar disorder (HCC)   . Depression   . GERD (gastroesophageal reflux disease)   . Hepatitis    HEP "C"  . History of kidney stones   . Hypertension   . Kidney stones   . Pericarditis 05/2015   a. echo 5/17: EF 60-65%, no RWMA, LV dias fxn nl, LA mildly dilated, RV sys fxn nl, PASP nl, moderate sized circumferential pericardial effusion was identified, 2.12 cm around the LV free wall, <1 cm around the RV free wall. Features were not c/w tamponade physiology  . PTSD (post-traumatic stress disorder)    Witnessed brother's suicide.  Marland Kitchen Restless leg syndrome   . Syncope     PAST SURGICAL HISTORY:   Past Surgical History:  Procedure Laterality Date  . CYSTOSCOPY WITH URETEROSCOPY AND STENT PLACEMENT    . ESOPHAGOGASTRODUODENOSCOPY N/A 01/11/2016   Procedure: ESOPHAGOGASTRODUODENOSCOPY (EGD);  Surgeon: Charlott Rakes, MD;  Location: [facility] ENDOSCOPY;  Service: Endoscopy;  Laterality: N/A;  . JOINT REPLACEMENT Right    TKR  . KNEE ARTHROSCOPY Right 06/25/2014   Procedure: ARTHROSCOPY KNEE;  Surgeon: Deeann Saint, MD;  Location: [facility] ORS;  Service: Orthopedics;  Laterality: Right;  partial arthroscopic medial menisectomy  . TOTAL KNEE ARTHROPLASTY Right 04/22/2015   Procedure: TOTAL KNEE ARTHROPLASTY;  Surgeon: Deeann Saint, MD;  Location:  [facility] ORS;  Service: Orthopedics;  Laterality: Right;  . TOTAL KNEE ARTHROPLASTY Left 10/30/2017   Procedure: TOTAL KNEE ARTHROPLASTY;  Surgeon: Deeann Saint, MD;  Location: [facility] ORS;  Service: Orthopedics;  Laterality: Left;    SOCIAL HISTORY:   Social History   Tobacco Use  . Smoking status: Former Smoker    Packs/day: 0.75    Years: 20.00    Pack years: 15.00    Types: Cigarettes    Last attempt to quit: 05/16/1984    Years since quitting: 33.6  . Smokeless tobacco: Never Used  Substance Use Topics  . Alcohol  use: Yes    Comment: occ    FAMILY HISTORY:   Family History  Problem Relation Age of Onset  . CVA Mother        deceased at age 54  . Depression Brother        Died by suicide at age 18    DRUG ALLERGIES:   Allergies  Allergen Reactions  . Toradol [Ketorolac Tromethamine] Hives    REVIEW OF SYSTEMS:   Review of Systems  Constitutional: Positive for chills, diaphoresis, fever and malaise/fatigue. Negative for weight loss.  HENT: Negative for congestion, ear pain, hearing loss, nosebleeds, sinus pain, sore throat and tinnitus.   Eyes: Negative for blurred vision, double vision and photophobia.  Respiratory: Negative for cough, hemoptysis, sputum production, shortness of breath and wheezing.   Cardiovascular: Negative for chest pain, palpitations, orthopnea, claudication, leg swelling and PND.  Gastrointestinal: Negative for abdominal pain, blood in stool, constipation, diarrhea, heartburn, melena and nausea.  Genitourinary: Negative for dysuria, flank pain, frequency, hematuria and urgency.  Musculoskeletal: Positive for joint pain. Negative for back pain, falls, myalgias and neck pain.  Skin: Negative for itching and rash.  Neurological: Positive for weakness. Negative for dizziness, tingling, tremors, sensory change, speech change, focal weakness, seizures, loss of consciousness and headaches.  Psychiatric/Behavioral: Negative for depression and memory loss. The patient is not nervous/anxious and does not have insomnia.    MEDICATIONS AT HOME:   Prior to Admission medications   Medication Sig Start Date End Date Taking? Authorizing Provider  divalproex (DEPAKOTE) 500 MG DR tablet Take 500 mg by mouth 2 (two) times daily.   Yes [provider]  enalapril (VASOTEC) 10 MG tablet Take 10 mg by mouth daily.   Yes [provider]  FLUoxetine (PROZAC) 20 MG capsule Take 80 mg by mouth daily.    Yes [provider]  gabapentin (NEURONTIN) 400 MG capsule  Take 1 capsule (400 mg total) by mouth 2 (two) times daily. 11/03/17  Yes Deeann Saint, MD  meloxicam (MOBIC) 15 MG tablet Take 1 tablet (15 mg total) by mouth daily. 11/03/17  Yes Deeann Saint, MD  QUEtiapine (SEROQUEL) 50 MG tablet Take 50 mg by mouth 4 (four) times daily as needed (for agitation).    Yes [provider]  SEROQUEL XR 400 MG 24 hr tablet Take 400 mg by mouth at bedtime.    Yes [provider]  aspirin EC 325 MG tablet Take 1 tablet (325 mg total) by mouth daily. Patient not taking: Reported on 12/31/2017 11/03/17   Deeann Saint, MD  benztropine (COGENTIN) 0.5 MG tablet Take 1 tablet (0.5 mg total) by mouth 2 (two) times daily as needed for tremors. Patient not taking: Reported on 12/31/2017 04/25/15   Deeann Saint, MD  HYDROcodone-acetaminophen [facility]) 5-325 MG tablet Take 1 tablet by mouth every 8 (eight) hours as needed for moderate pain. Patient  not taking: Reported on 12/31/2017 12/04/17   Evon SlackGaines, Thomas C, PA-C  pantoprazole (PROTONIX) 40 MG tablet Take 1 tablet (40 mg total) by mouth 2 (two) times daily. Patient not taking: Reported on 12/07/2016 06/02/15   Enid BaasKalisetti, Radhika, MD  REXULTI 2 MG TABS TK 1 T PO HS 11/22/17   [provider]  rosuvastatin (CRESTOR) 10 MG tablet Take 1 tablet (10 mg total) by mouth daily. Patient not taking: Reported on 02/27/2017 06/02/15   Enid BaasKalisetti, Radhika, MD  sucralfate (CARAFATE) 1 g tablet Take 1 tablet (1 g total) by mouth 4 (four) times daily -  with meals and at bedtime. Patient not taking: Reported on 12/31/2017 01/12/16   Katharina CaperVaickute, Rima, MD  zolpidem (AMBIEN) 10 MG tablet Take 10 mg by mouth at bedtime as needed for sleep.    [provider]      VITAL SIGNS:  Blood pressure 121/78, pulse 100, temperature 99.7 F (37.6 C), temperature source Oral, resp. rate 19, height 6\' 1"  (1.854 m), weight 80 kg, SpO2 96 %.  PHYSICAL EXAMINATION:  Physical Exam Constitutional:      General: He is  awake. He is not in acute distress.    Appearance: He is well-developed and normal weight. He is ill-appearing and toxic-appearing. He is not diaphoretic.     Interventions: He is not intubated. HENT:     Head: Normocephalic and atraumatic.     Mouth/Throat:     Mouth: Mucous membranes are dry.     Pharynx: Oropharynx is clear.  Eyes:     General: Lids are normal. No scleral icterus.    Extraocular Movements: Extraocular movements intact.     Conjunctiva/sclera: Conjunctivae normal.  Neck:     Musculoskeletal: Neck supple.  Cardiovascular:     Rate and Rhythm: Normal rate and regular rhythm.  No extrasystoles are present.    Heart sounds: Normal heart sounds, S1 normal and S2 normal. Heart sounds not distant. No murmur. No friction rub. No gallop. No S3 or S4 sounds.   Pulmonary:     Effort: Pulmonary effort is normal. No tachypnea, bradypnea, accessory muscle usage, prolonged expiration, respiratory distress or retractions. He is not intubated.     Breath sounds: Normal breath sounds. No stridor, decreased air movement or transmitted upper airway sounds. No decreased breath sounds, wheezing, rhonchi or rales.  Abdominal:     General: Bowel sounds are decreased. There is no distension.     Palpations: Abdomen is soft.     Tenderness: There is no abdominal tenderness. There is no guarding or rebound.  Musculoskeletal:        General: Swelling and tenderness present.     Left knee: He exhibits decreased range of motion, swelling, effusion and erythema. Tenderness found. Medial joint line tenderness noted.  Lymphadenopathy:     Cervical: No cervical adenopathy.  Skin:    General: Skin is warm and dry.     Findings: Erythema present. No rash.  Neurological:     General: No focal deficit present.     Mental Status: He is alert and oriented to person, place, and time. Mental status is at baseline.  Psychiatric:        Attention and Perception: Attention and perception normal.         Mood and Affect: Mood and affect normal.        Speech: Speech normal.        Behavior: Behavior normal. Behavior is cooperative.  Thought Content: Thought content normal.        Cognition and Memory: Cognition and memory normal.        Judgment: Judgment normal.    (+) L knee swelling/TTP along medial aspect, (+) erythema. LABORATORY PANEL:   CBC Recent Labs  Lab 12/30/17 2251  WBC 6.7  HGB 10.7*  HCT 32.9*  PLT 236   ------------------------------------------------------------------------------------------------------------------  Chemistries  Recent Labs  Lab 12/30/17 2251  NA 133*  K 3.6  CL 105  CO2 17*  GLUCOSE 142*  BUN 13  CREATININE 0.93  CALCIUM 9.1  AST 23  ALT 15  ALKPHOS 82  BILITOT 0.6   ------------------------------------------------------------------------------------------------------------------  Cardiac Enzymes No results for input(s): TROPONINI in the last 168 hours. ------------------------------------------------------------------------------------------------------------------  RADIOLOGY:  Dg Knee Complete 4 Views Left  Result Date: 12/30/2017 CLINICAL DATA:  Acute onset of left knee erythema, pain, and swelling. Personal history of left total knee arthroplasty. EXAM: LEFT KNEE - COMPLETE 4+ VIEW COMPARISON:  Left knee radiographs performed 12/04/2017 FINDINGS: There is no evidence of fracture or dislocation. The patient's total knee arthroplasty is grossly unremarkable in appearance, without evidence of loosening. No significant joint effusion is seen. Diffuse soft tissue swelling is noted about the knee. IMPRESSION: 1. No evidence of fracture or dislocation. Total knee arthroplasty is grossly unremarkable in appearance. 2. Diffuse soft tissue swelling about the knee. Electronically Signed   By: Roanna Raider M.D.   On: 12/30/2017 23:07   Dg Hip Unilat W Or Wo Pelvis 2-3 Views Right  Result Date: 12/30/2017 CLINICAL DATA:  Right  hip pain EXAM: DG HIP (WITH OR WITHOUT PELVIS) 2-3V RIGHT COMPARISON:  CT 01/27/2016 FINDINGS: Pubic symphysis and rami are intact. The SI joints are non widened. No fracture or malalignment. Mild degenerative changes of both hips IMPRESSION: No acute osseous abnormality Electronically Signed   By: Jasmine Pang M.D.   On: 12/30/2017 23:33   IMPRESSION AND PLAN:   A/P: 49M s/p recent L TKR (11/03/2017), s/p outpt ABx for L knee cellulitis (Rx 11/25), now p/w sepsis, L knee cellulitis, suspected septic bursitis vs. septic arthritis. Dehydration, hypovolemic hyponatremia, hyperglycemia, hypoalbuminemia, normocytic anemia. -Recent L TKR, L knee cellulitis, sepsis, suspected septic bursitis vs. septic arthritis: Pt s/p L TKR 11/03/2017. Seen in ED 12/04/2017, Dx L knee cellulitis, Rx Keflex. Failed outpt Tx. Endorses subjective fever, chills, rigors, diaphoresis and night sweats. T 100.4, HR > 90, (-) leukocytosis; SIRS (+). Sepsis 2/2 L knee cellulitis, suspected septic bursitis vs. septic arthritis. XR L knee (+) soft tissue swelling. Aspirated in ED, Cx pending. BCx also pending. Ceftriaxone + Vancomycin. Orthopedic Surgery consult. BP slowly decreasing from time of arrival to ED, 90s/60s at the time of my assessment. Bolus IVF. Pain ctrl. Telemetry, continuous cardiac monitoring until BP and HR improve. -Dehydration, hypovolemic hyponatremia: Na+ 133. Endorses dehydration, decreased UOP. IVF. Monitor BMP. -Hyperglycemia: Possibly 2/2 infxn. Will evaluate for diabetes w/ HbA1c, as this will have implications in mgmt of infxn. -Hypoalbuminemia: Prealbumin. -Normocytic anemia: Stable. Likely 2/2 anemia of chronic disease. No evidence of active/acute blood loss. -c/w home meds/formulary subs. -FEN/GI: Cardiac diet. -DVT PPx: Lovenox. -Code status: Full code. -Disposition: Admission, > 2 midnights.   All the records are reviewed and case discussed with ED provider. Management plans discussed with the  patient, family and they are in agreement.  CODE STATUS: Full code.  TOTAL TIME TAKING CARE OF THIS PATIENT: 75 minutes.    Barbaraann Rondo M.D on 12/31/2017 at 1:59 AM  Between 7am to 6pm - Pager - [phone]  After 6pm go to [email or web address] - Scientist, research (life sciences)password [email or web address]  [facility] [place] [facility]  Office  [phone]  CC: Primary care physician; [facility]   Note: This dictation was prepared with Dragon dictation along with smaller phrase technology. Any transcriptional errors that result from this process are unintentional.

## 2017-12-31 NOTE — Progress Notes (Addendum)
Pharmacy Antibiotic Note  [name] is a 57 y.o. male admitted on 12/30/2017 with septic arthritis w/ h/o TKR on 10/19.  Pharmacy has been consulted for vancomycin dosing. Patient received vanc 1g IV x 1 in ED.  Plan: Will continue vanc 1g IV q8h w/ 6 hour stack  Will draw a trough 12/23 @ 0700 prior to 4th dose. Patient currently receiving ceftriaxone 2g IV daily.  Ke 0.0861 T1/2 8 hrs  Goal trough 15 - 20 mcg/mL  Height: 6' (182.9 cm) Weight: 179 lb 3.2 oz (81.3 kg) IBW/kg (Calculated) : 77.6  Temp (24hrs), Avg:99.3 F (37.4 C), Min:98.4 F (36.9 C), Max:100.4 F (38 C)  Recent Labs  Lab 12/30/17 2251 12/31/17 0207  WBC 6.7  --   CREATININE 0.93  --   LATICACIDVEN 2.5* 1.0    Estimated Creatinine Clearance: 98.5 mL/min (by C-G formula based on SCr of 0.93 mg/dL).    Allergies  Allergen Reactions  . Toradol [Ketorolac Tromethamine] Hives    Thank you for allowing pharmacy to be a part of this patient's care.  Thomasene Rippleavid Giuseppina Quinones, PharmD, BCPS Clinical Pharmacist 12/31/2017

## 2017-12-31 NOTE — Progress Notes (Signed)
[facility] on 1C.  Patient transferred to 1C via wheelchair.  Patient sent with belongings.

## 2017-12-31 NOTE — ED Notes (Signed)
MD Roxan Hockeyobinson ordered to draw lactic acid level post NaCl bolus completion

## 2017-12-31 NOTE — Consult Note (Signed)
ORTHOPAEDIC CONSULTATION  REQUESTING PHYSICIAN: Victor FinnerPatel, Sona, MD  Chief Complaint: Left knee pain and focal swelling  HPI: [name] is a 57 y.o. male who complains of left knee pain and swelling x3 weeks.  He had a left total knee arthroplasty on 10/30/2017 by Dr. Hyacinth MeekerMiller.  [name] was admitted overnight after presenting to the ER complaining of left knee pain along with fever and chills which began yesterday, according to the patient.  Patient states he noted focal swelling develop over his medial left knee beginning approximally 3 weeks ago.  He denies any injury to the knee.  Patient states he is noted erythema overlying his incision since his staples were removed postoperatively.  He never contacted Dr. Hyacinth MeekerMiller about this but went to the emergency room on 12/04/2017.  Emergency room staff started the patient on Keflex for mild cellulitis along his proximal incision site.  Patient was instructed to call orthopedics the following day.  The patient admits he never contacted Dr. Hyacinth MeekerMiller or the [facility].  Patient admits he has not been performing physical therapy but is trying to do exercises and walk at home.  He states the erythema over his incision has not improved despite treatment with Keflex.  The swelling over the medial knee developed since his ER visit.  This was aspirated in the ER last night after an ultrasound by the ER physician showed a fluid collection medially but below the joint line.  Results of this aspirate including Gram stain and culture are pending at [facility].  Patient has a white cell count of 6.7, his CRP is 13.7 and sed rate is 89, both of which are high.  Patient had a temperature of 100.4 upon arrival to the ED but has been afebrile ever since.  Past Medical History:  Diagnosis Date  . Anxiety   . Arthritis    knees and hands  . Bipolar disorder (HCC)   . Depression   . GERD (gastroesophageal reflux disease)   . Hepatitis    HEP "C"  . History  of kidney stones   . Hypertension   . Kidney stones   . Pericarditis 05/2015   a. echo 5/17: EF 60-65%, no RWMA, LV dias fxn nl, LA mildly dilated, RV sys fxn nl, PASP nl, moderate sized circumferential pericardial effusion was identified, 2.12 cm around the LV free wall, <1 cm around the RV free wall. Features were not c/w tamponade physiology  . PTSD (post-traumatic stress disorder)    Witnessed brother's suicide.  Marland Kitchen. Restless leg syndrome   . Syncope    Past Surgical History:  Procedure Laterality Date  . CYSTOSCOPY WITH URETEROSCOPY AND STENT PLACEMENT    . ESOPHAGOGASTRODUODENOSCOPY N/A 01/11/2016   Procedure: ESOPHAGOGASTRODUODENOSCOPY (EGD);  Surgeon: Charlott RakesVincent Schooler, MD;  [facility];  Service: Endoscopy;  Laterality: N/A;  . JOINT REPLACEMENT Right    TKR  . KNEE ARTHROSCOPY Right 06/25/2014   Procedure: ARTHROSCOPY KNEE;  Surgeon: Deeann SaintHoward Miller, MD;  Location: [facility] ORS;  Service: Orthopedics;  Laterality: Right;  partial arthroscopic medial menisectomy  . TOTAL KNEE ARTHROPLASTY Right 04/22/2015   Procedure: TOTAL KNEE ARTHROPLASTY;  Surgeon: Deeann SaintHoward Miller, MD;  Location: [facility] ORS;  Service: Orthopedics;  Laterality: Right;  . TOTAL KNEE ARTHROPLASTY Left 10/30/2017   Procedure: TOTAL KNEE ARTHROPLASTY;  Surgeon: Deeann SaintMiller, Howard, MD;  Location: [facility] ORS;  Service: Orthopedics;  Laterality: Left;   Social History   Socioeconomic History  . Marital status: Single    Spouse  name: Not on file  . Number of children: Not on file  . Years of education: Not on file  . Highest education level: Not on file  Occupational History  . Not on file  Social Needs  . Financial resource strain: Not on file  . Food insecurity:    Worry: Not on file    Inability: Not on file  . Transportation needs:    Medical: Not on file    Non-medical: Not on file  Tobacco Use  . Smoking status: Former Smoker    Packs/day: 0.75    Years: 20.00    Pack years: 15.00    Types: Cigarettes     Last attempt to quit: 05/16/1984    Years since quitting: 33.6  . Smokeless tobacco: Never Used  Substance and Sexual Activity  . Alcohol use: Yes    Comment: occ  . Drug use: No  . Sexual activity: Not on file  Lifestyle  . Physical activity:    Days per week: Not on file    Minutes per session: Not on file  . Stress: Not on file  Relationships  . Social connections:    Talks on phone: Not on file    Gets together: Not on file    Attends religious service: Not on file    Active member of club or organization: Not on file    Attends meetings of clubs or organizations: Not on file    Relationship status: Not on file  Other Topics Concern  . Not on file  Social History Narrative  . Not on file   Family History  Problem Relation Age of Onset  . CVA Mother        deceased at age 75  . Depression Brother        Died by suicide at age 35   Allergies  Allergen Reactions  . Toradol [Ketorolac Tromethamine] Hives   Prior to Admission medications   Medication Sig Start Date End Date Taking? Authorizing Provider  divalproex (DEPAKOTE) 500 MG DR tablet Take 500 mg by mouth 2 (two) times daily.   Yes [provider]  enalapril (VASOTEC) 10 MG tablet Take 10 mg by mouth daily.   Yes [provider]  FLUoxetine (PROZAC) 20 MG capsule Take 80 mg by mouth daily.    Yes [provider]  gabapentin (NEURONTIN) 400 MG capsule Take 1 capsule (400 mg total) by mouth 2 (two) times daily. 11/03/17  Yes Deeann Saint, MD  meloxicam (MOBIC) 15 MG tablet Take 1 tablet (15 mg total) by mouth daily. 11/03/17  Yes Deeann Saint, MD  QUEtiapine (SEROQUEL) 50 MG tablet Take 50 mg by mouth 4 (four) times daily as needed (for agitation).    Yes [provider]  SEROQUEL XR 400 MG 24 hr tablet Take 400 mg by mouth at bedtime.    Yes [provider]  aspirin EC 325 MG tablet Take 1 tablet (325 mg total) by mouth daily. Patient not taking: Reported on  12/31/2017 11/03/17   Deeann Saint, MD  benztropine (COGENTIN) 0.5 MG tablet Take 1 tablet (0.5 mg total) by mouth 2 (two) times daily as needed for tremors. Patient not taking: Reported on 12/31/2017 04/25/15   Deeann Saint, MD  HYDROcodone-acetaminophen [facility]) 5-325 MG tablet Take 1 tablet by mouth every 8 (eight) hours as needed for moderate pain. Patient not taking: Reported on 12/31/2017 12/04/17   Evon Slack, PA-C  pantoprazole (PROTONIX) 40 MG tablet Take 1  tablet (40 mg total) by mouth 2 (two) times daily. Patient not taking: Reported on 12/07/2016 06/02/15   Enid Baas, MD  REXULTI 2 MG TABS TK 1 T PO HS 11/22/17   [provider]  rosuvastatin (CRESTOR) 10 MG tablet Take 1 tablet (10 mg total) by mouth daily. Patient not taking: Reported on 02/27/2017 06/02/15   Enid Baas, MD  sucralfate (CARAFATE) 1 g tablet Take 1 tablet (1 g total) by mouth 4 (four) times daily -  with meals and at bedtime. Patient not taking: Reported on 12/31/2017 01/12/16   Katharina Caper, MD  zolpidem (AMBIEN) 10 MG tablet Take 10 mg by mouth at bedtime as needed for sleep.    [provider]   Dg Knee Complete 4 Views Left  Result Date: 12/30/2017 CLINICAL DATA:  Acute onset of left knee erythema, pain, and swelling. Personal history of left total knee arthroplasty. EXAM: LEFT KNEE - COMPLETE 4+ VIEW COMPARISON:  Left knee radiographs performed 12/04/2017 FINDINGS: There is no evidence of fracture or dislocation. The patient's total knee arthroplasty is grossly unremarkable in appearance, without evidence of loosening. No significant joint effusion is seen. Diffuse soft tissue swelling is noted about the knee. IMPRESSION: 1. No evidence of fracture or dislocation. Total knee arthroplasty is grossly unremarkable in appearance. 2. Diffuse soft tissue swelling about the knee. Electronically Signed   By: Roanna Raider M.D.   On: 12/30/2017 23:07   Dg Hip Unilat W Or Wo Pelvis  2-3 Views Right  Result Date: 12/30/2017 CLINICAL DATA:  Right hip pain EXAM: DG HIP (WITH OR WITHOUT PELVIS) 2-3V RIGHT COMPARISON:  CT 01/27/2016 FINDINGS: Pubic symphysis and rami are intact. The SI joints are non widened. No fracture or malalignment. Mild degenerative changes of both hips IMPRESSION: No acute osseous abnormality Electronically Signed   By: Jasmine Pang M.D.   On: 12/30/2017 23:33    Positive ROS: All other systems have been reviewed and were otherwise negative with the exception of those mentioned in the HPI and as above.  Physical Exam: General: Alert, no acute distress  MUSCULOSKELETAL: Left knee: Patient skin is intact.  His incision is clean dry and intact.  Patient has mild erythema over his anterior total knee incision which does not extend more than a centimeter from the incision.  There is no drainage or fluctuance near the incision.  Patient does not have a large knee effusion.  His range of motion is approximately -5 to 10 degrees of full extension to approximately 90 degrees of flexion.  The patient has pain at the end range of flexion but does not have significant pain in the mid range of motion.  There is no lower leg edema.  Patient has approximately a 3 cm diameter fluid collection inferior to the medial joint line which is soft and compressible and moderately tender.  Patient also has tenderness around his incision.  Distally he is neurovascular intact.  Patient has intact motor function.  Assessment: Left pain and focal medial swelling 6 weeks following left total knee arthroplasty  Plan: Unfortunately the patient has not been in contact with our office.  This is the first Dr. Hyacinth Meeker and I have heard of this patient's postoperative issues.  Patient's clinical picture is not classic for a septic knee joint but a fluid collection is concerning.  This may indicate bursitis or abscess.  Results of the patient's fluid collection aspirate are pending.  The fluid  collection medially was aspirated by the ER  staff and sent for Gram stain and culture.  Patient does not have a significant knee effusion and I doubt whether an aspirate of the knee joint will yield any significant fluid for analysis.  I am ordering a CT scan of the left knee with contrast to evaluate the fluid collection.  Patient will be n.p.o. after midnight.  I have spoken with Dr. Hyacinth MeekerMiller and he will review the patient's CT scan and examine his knee to determine whether any surgical procedure is warranted.  Patient will continue on his IV antibiotics including Rocephin and vancomycin.  I have explained this plan to the patient who understands and agrees with this plan.    Juanell FairlyKRASINSKI, Katilynn Sinkler, MD    12/31/2017 1:34 PM

## 2017-12-31 NOTE — ED Notes (Signed)
Danelle EarthlyNoel RN unavailable for PIV insertion. MD Roxan Hockeyobinson at bedside for PIV insertion

## 2017-12-31 NOTE — Plan of Care (Signed)
  Problem: Fluid Volume: Goal: Hemodynamic stability will improve Outcome: Progressing   Problem: Clinical Measurements: Goal: Diagnostic test results will improve Outcome: Progressing Goal: Signs and symptoms of infection will decrease Outcome: Progressing   Problem: Education: Goal: Knowledge of General Education information will improve Description: Including pain rating scale, medication(s)/side effects and non-pharmacologic comfort measures Outcome: Progressing   

## 2018-01-01 DIAGNOSIS — K210 Gastro-esophageal reflux disease with esophagitis: Secondary | ICD-10-CM

## 2018-01-01 DIAGNOSIS — T8454XA Infection and inflammatory reaction due to internal left knee prosthesis, initial encounter: Principal | ICD-10-CM

## 2018-01-01 DIAGNOSIS — Z87891 Personal history of nicotine dependence: Secondary | ICD-10-CM

## 2018-01-01 DIAGNOSIS — Z96653 Presence of artificial knee joint, bilateral: Secondary | ICD-10-CM

## 2018-01-01 DIAGNOSIS — B9561 Methicillin susceptible Staphylococcus aureus infection as the cause of diseases classified elsewhere: Secondary | ICD-10-CM

## 2018-01-01 DIAGNOSIS — Z888 Allergy status to other drugs, medicaments and biological substances status: Secondary | ICD-10-CM

## 2018-01-01 DIAGNOSIS — Z8619 Personal history of other infectious and parasitic diseases: Secondary | ICD-10-CM

## 2018-01-01 DIAGNOSIS — F319 Bipolar disorder, unspecified: Secondary | ICD-10-CM

## 2018-01-01 DIAGNOSIS — F1911 Other psychoactive substance abuse, in remission: Secondary | ICD-10-CM

## 2018-01-01 LAB — PROTIME-INR
INR: 1.32
Prothrombin Time: 16.2 seconds — ABNORMAL HIGH (ref 11.4–15.2)

## 2018-01-01 LAB — TYPE AND SCREEN
ABO/RH(D): O POS
Antibody Screen: NEGATIVE

## 2018-01-01 LAB — MRSA PCR SCREENING: MRSA BY PCR: POSITIVE — AB

## 2018-01-01 LAB — PHOSPHORUS: Phosphorus: 3.6 mg/dL (ref 2.5–4.6)

## 2018-01-01 MED ORDER — MORPHINE SULFATE (PF) 2 MG/ML IV SOLN
1.0000 mg | INTRAVENOUS | Status: DC | PRN
  Administered 2018-01-01 – 2018-01-03 (×5): 2 mg via INTRAVENOUS
  Filled 2018-01-01 (×6): qty 1

## 2018-01-01 MED ORDER — TRANEXAMIC ACID 1000 MG/10ML IV SOLN
1.5000 mg/kg/h | INTRAVENOUS | Status: AC
  Filled 2018-01-01: qty 25

## 2018-01-01 MED ORDER — VANCOMYCIN HCL 10 G IV SOLR
1250.0000 mg | Freq: Two times a day (BID) | INTRAVENOUS | Status: DC
  Administered 2018-01-01 – 2018-01-02 (×2): 1250 mg via INTRAVENOUS
  Filled 2018-01-01 (×4): qty 1250

## 2018-01-01 MED ORDER — CHLORHEXIDINE GLUCONATE CLOTH 2 % EX PADS
6.0000 | MEDICATED_PAD | Freq: Every day | CUTANEOUS | Status: DC
  Administered 2018-01-02 – 2018-01-04 (×2): 6 via TOPICAL

## 2018-01-01 MED ORDER — CHLORHEXIDINE GLUCONATE 4 % EX LIQD: 1.0000 "application " | Freq: Once | CUTANEOUS | Status: DC

## 2018-01-01 MED ORDER — ENSURE ENLIVE PO LIQD
237.0000 mL | Freq: Two times a day (BID) | ORAL | Status: DC
  Administered 2018-01-03 – 2018-01-04 (×3): 237 mL via ORAL

## 2018-01-01 MED ORDER — CEFAZOLIN SODIUM-DEXTROSE 2-4 GM/100ML-% IV SOLN
2.0000 g | Freq: Three times a day (TID) | INTRAVENOUS | Status: DC
  Administered 2018-01-01 – 2018-01-04 (×10): 2 g via INTRAVENOUS
  Filled 2018-01-01 (×12): qty 100

## 2018-01-01 MED ORDER — TRANEXAMIC ACID 1000 MG/10ML IV SOLN: 1.5000 mg/kg/h | INTRAVENOUS | Status: DC

## 2018-01-01 MED ORDER — MUPIROCIN 2 % EX OINT
1.0000 "application " | TOPICAL_OINTMENT | Freq: Two times a day (BID) | CUTANEOUS | Status: DC
  Administered 2018-01-01 – 2018-01-04 (×5): 1 via NASAL
  Filled 2018-01-01: qty 22

## 2018-01-01 MED ORDER — SODIUM CHLORIDE 0.9 % IV SOLN
INTRAVENOUS | Status: DC
  Administered 2018-01-01 – 2018-01-02 (×2): via INTRAVENOUS

## 2018-01-01 MED ORDER — CEFAZOLIN SODIUM-DEXTROSE 2-4 GM/100ML-% IV SOLN
2.0000 g | Freq: Four times a day (QID) | INTRAVENOUS | Status: DC
  Administered 2018-01-01: 2 g via INTRAVENOUS
  Filled 2018-01-01 (×5): qty 100

## 2018-01-01 NOTE — Progress Notes (Signed)
Left External Jugular IV was slightly occluded. Flushed with NS and has good blood return.

## 2018-01-01 NOTE — Progress Notes (Signed)
Subjective:   Procedure(s) (LRB): INCISION AND DRAINAGE LEFT KNEE (Left)   As per Dr. Samuel GermanyKrasinski's note the patient has had warmth in the left knee for about 3 weeks.  His white count was normal and he has been afebrile for 24 hours.  He still has moderate pain.  Cultures have shown gram-positive cocci both from the aspirate around the knee and blood cultures. Patient reports pain as moderate.  Objective:   VITALS:   Vitals:   12/31/17 2328 01/01/18 0720  BP: 114/78 93/65  Pulse: 94 94  Resp: 18   Temp: 98.5 F (36.9 C) 97.7 F (36.5 C)  SpO2: 99% 99%    Neurologically intact ABD soft Neurovascular intact Sensation intact distally Intact pulses distally Dorsiflexion/Plantar flexion intact The knee is slightly warm.  There is no significant redness.  His wound is healed.  He has swelling medially below the joint line.  This is fluctuant.  He also has redness consistent with cellulitis over the right hip region.  Is been present for about 3 days but he just mentioned it today.  LABS Recent Labs    12/30/17 2251  HGB 10.7*  HCT 32.9*  WBC 6.7  PLT 236    Recent Labs    12/30/17 2251  NA 133*  K 3.6  BUN 13  CREATININE 0.93  GLUCOSE 142*    No results for input(s): LABPT, INR in the last 72 hours.   Assessment/Plan:   Procedure(s) (LRB): INCISION AND DRAINAGE LEFT KNEE (Left)   I do not believe a simple I&D would be beneficial here.  I will consult with my partner Dr. Odis LusterBowers who is our total knee specialist on this.  He may need to have a complete washout and/or removal of the prostheses and a spacer put in place.  I discussed this with the patient.

## 2018-01-01 NOTE — Progress Notes (Signed)
Subjective:   Procedure(s) (LRB): INCISION AND DRAINAGE LEFT KNEE (Left)    Have discussed the case with Dr. Odis LusterBowers.  He will be able to help me with surgery tomorrow.  We will attempt to do a washout and poly-exchange with the possibility of component removal and insertion of a spacer if necessary.  I have discussed this with the patient and he is agreeable to this.  Will need at least 6 weeks of IV antibiotics.  Objective:   VITALS:   Vitals:   01/01/18 0720 01/01/18 0847  BP: 93/65 98/68  Pulse: 94 90  Resp:    Temp: 97.7 F (36.5 C)   SpO2: 99% 100%     LABS Recent Labs    12/30/17 2251  HGB 10.7*  HCT 32.9*  WBC 6.7  PLT 236    Recent Labs    12/30/17 2251  NA 133*  K 3.6  BUN 13  CREATININE 0.93  GLUCOSE 142*    No results for input(s): LABPT, INR in the last 72 hours.   Assessment/Plan:   Procedure(s) (LRB): INCISION AND DRAINAGE LEFT KNEE (Left)   As above.  Plan on surgery tomorrow morning.

## 2018-01-01 NOTE — Progress Notes (Signed)
[facility] [place] NAME: [name]    MR#:  [id number]  DATE OF BIRTH:  14-Nov-1960  SUBJECTIVE:  patient came in with pain in the left knee with swelling and redness. He had fever documented in the ED. Patient was given Keflex a few days ago from the emergency room for cellulitis left knee.  Complains of pain right hip and left knee. Blood pressure on the lower side. Complains of feeling sweaty. No fever this morning though.  REVIEW OF SYSTEMS:   Review of Systems  Constitutional: Positive for fever and malaise/fatigue. Negative for chills and weight loss.  HENT: Negative for ear discharge, ear pain and nosebleeds.   Eyes: Negative for blurred vision, pain and discharge.  Respiratory: Negative for sputum production, shortness of breath, wheezing and stridor.   Cardiovascular: Negative for chest pain, palpitations, orthopnea and PND.  Gastrointestinal: Negative for abdominal pain, diarrhea, nausea and vomiting.  Genitourinary: Negative for frequency and urgency.  Musculoskeletal: Positive for joint pain. Negative for back pain.  Neurological: Positive for weakness. Negative for sensory change, speech change and focal weakness.  Psychiatric/Behavioral: Negative for depression and hallucinations. The patient is not nervous/anxious.    Tolerating Diet: NPO Tolerating PT: pending  DRUG ALLERGIES:   Allergies  Allergen Reactions  . Toradol [Ketorolac Tromethamine] Hives    VITALS:  Blood pressure 93/65, pulse 94, temperature 97.7 F (36.5 C), temperature source Oral, resp. rate 18, height 6' (1.829 m), weight 81.3 kg, SpO2 99 %.  PHYSICAL EXAMINATION:   Physical Exam  GENERAL:  57 y.o.-year-old patient lying in the bed with no acute distress.  EYES: Pupils equal, round, reactive to light and accommodation. No scleral icterus. Extraocular muscles intact.  HEENT: Head atraumatic, normocephalic. Oropharynx and nasopharynx  clear.  NECK:  Supple, no jugular venous distention. No thyroid enlargement, no tenderness.  LUNGS: Normal breath sounds bilaterally, no wheezing, rales, rhonchi. No use of accessory muscles of respiration.  CARDIOVASCULAR: S1, S2 normal. No murmurs, rubs, or gallops.  ABDOMEN: Soft, nontender, nondistended. Bowel sounds present. No organomegaly or mass.  EXTREMITIES: No cyanosis, clubbing or edema b/l.  Left knee surgical scar, swollen tender with erythema. Right hip cellulitis patch  marked with skin marker NEUROLOGIC: Cranial nerves II through XII are intact. No focal Motor or sensory deficits b/l.   PSYCHIATRIC:  patient is alert and oriented x 3.  SKIN: No obvious rash, lesion, or ulcer.   LABORATORY PANEL:  CBC Recent Labs  Lab 12/30/17 2251  WBC 6.7  HGB 10.7*  HCT 32.9*  PLT 236    Chemistries  Recent Labs  Lab 12/30/17 2251  NA 133*  K 3.6  CL 105  CO2 17*  GLUCOSE 142*  BUN 13  CREATININE 0.93  CALCIUM 9.1  MG 1.8  AST 23  ALT 15  ALKPHOS 82  BILITOT 0.6   Cardiac Enzymes No results for input(s): TROPONINI in the last 168 hours. RADIOLOGY:  Ct Knee Left W Contrast  Result Date: 12/31/2017 CLINICAL DATA:  Medial knee swelling, possible mass. Left knee pain. EXAM: CT OF THE left KNEE WITH CONTRAST TECHNIQUE: Multidetector CT imaging was performed following the standard protocol during bolus administration of intravenous contrast. COMPARISON:  Radiographs 12/30/2017 FINDINGS: Bones/Joint/Cartilage Total knee prosthesis. This introduces streak artifact which obscures surrounding findings. No periprosthetic fracture is identified.  No acute bony findings. Knee effusion with suspected synovitis. Synovitis and joint fluid could be causing palpable abnormalities lateral to the  distal femoral metaphysis (for example in the vicinity of image 36/8). A fluid collection medial to the proximal tibial rim on image 55/5 could be from MCL bursitis. Ligaments Suboptimally  assessed by CT. Muscles and Tendons Unremarkable Soft tissues Mild subcutaneous edema lateral to the distal femur and in the proximal calf. IMPRESSION: 1. My impression is that there is potentially complex fluid in the suprapatellar bursa with associated synovitis. 2. Fluid density collection medial to the proximal tibial tray and metaphysis for example on image 55/15. Cause is not entirely certain although this certainly could be from MCL bursitis or localized fluid collection along the medial gutter. 3. No periprosthetic fracture or bony complication is identified. Electronically Signed   By: Van Clines M.D.   On: 12/31/2017 16:10   Dg Knee Complete 4 Views Left  Result Date: 12/30/2017 CLINICAL DATA:  Acute onset of left knee erythema, pain, and swelling. Personal history of left total knee arthroplasty. EXAM: LEFT KNEE - COMPLETE 4+ VIEW COMPARISON:  Left knee radiographs performed 12/04/2017 FINDINGS: There is no evidence of fracture or dislocation. The patient's total knee arthroplasty is grossly unremarkable in appearance, without evidence of loosening. No significant joint effusion is seen. Diffuse soft tissue swelling is noted about the knee. IMPRESSION: 1. No evidence of fracture or dislocation. Total knee arthroplasty is grossly unremarkable in appearance. 2. Diffuse soft tissue swelling about the knee. Electronically Signed   By: Garald Balding M.D.   On: 12/30/2017 23:07   Dg Hip Unilat W Or Wo Pelvis 2-3 Views Right  Result Date: 12/30/2017 CLINICAL DATA:  Right hip pain EXAM: DG HIP (WITH OR WITHOUT PELVIS) 2-3V RIGHT COMPARISON:  CT 01/27/2016 FINDINGS: Pubic symphysis and rami are intact. The SI joints are non widened. No fracture or malalignment. Mild degenerative changes of both hips IMPRESSION: No acute osseous abnormality Electronically Signed   By: Donavan Foil M.D.   On: 12/30/2017 23:33   ASSESSMENT AND PLAN:  [name] is a 57 y.o. male who complains of left knee  pain and swelling x3 weeks.  He had a left total knee arthroplasty on 10/30/2017 by Dr. Sabra Heck.  [name] was admitted overnight after presenting to the ER complaining of left knee pain along with fever and chills  1. Sepsis-gram-positive cocci source appears to be left knee joint which is status post left TKA 1021 2019 -IV cefazolin -left knee fluid positive for [facility] -orthopedic consultation appreciated by Dr. Sabra Heck and Dr. Mack Guise -Dr. Sabra Heck will discuss with Dr. Harlow Mares to see if patient needs removal of left knee prosthesis. -Infectious disease consultation-- secure chat message sent to Dr. Tama High -IV fluids -inflammatory markers ESR and CRP elevated  2. Hypotension suspected due to number one -hold enalapril -IV fluids -echo of the heart  3. Bipolar disorder/history of PTSD -continue Seroquel  4. DVT prophylaxis subcu Lovenox  Case discussed with Care Management/Social Worker. Management plans discussed with the patient, family and they are in agreement.  CODE STATUS: full  DVT Prophylaxis: Lovenox  TOTAL TIME TAKING CARE OF THIS PATIENT: 30 minutes.  >50% time spent on counselling and coordination of care  POSSIBLE D/C IN *few* DAYS, DEPENDING ON CLINICAL CONDITION.  Note: This dictation was prepared with Dragon dictation along with smaller phrase technology. Any transcriptional errors that result from this process are unintentional.  Fritzi Mandes M.D on 01/01/2018 at 7:37 AM  Between 7am to 6pm - Pager - [phone]  After 6pm go to [email or web address] - [place]  [facility]  Office  [phone]  CC: Primary care physician; [facility] ID: [name], male   DOB: 01/25/1960, 57 y.o.   MRN: [id number]

## 2018-01-01 NOTE — Progress Notes (Signed)
ID Full consult note to follow Left TKA on 11/03/17  Presenting with swelling and erythema and tenderness and pain of the left knee PJI with staph aureus- currently on cefazolin- add vanco Going for surgery tomorrow. Will de-escalate antibiotic once culture finalize

## 2018-01-01 NOTE — Progress Notes (Addendum)
PHARMACY - PHYSICIAN COMMUNICATION CRITICAL VALUE ALERT - BLOOD CULTURE IDENTIFICATION (BCID)  [name] is an 57 y.o. male [facility] a chief [facility] of Sepsis  Assessment:  1/4 bottles Aerobic gram positive cocci with mecA not detected (include suspected source if known)  12/23 @ 0500 2nd bottle out of 4 growing GPC's no BCID on this bottle. Spoke to MD Pras okay to continue current txt and f/u on cx/sx.  Name of physician (or Provider) Contacted: Dr. Kathe MarinerJ Patel  Current antibiotics: Ceftriaxone 2 gm every 24 hours and Vancomycin 1 gm every 8 hours.  Changes to prescribed antibiotics recommended:  Cefazolin 2 gm IV every 8 hours Recommendations accepted by provider  Results for orders placed or performed during the hospital encounter of 12/30/17  Blood Culture ID Panel (Reflexed) (Collected: 12/30/2017 10:51 PM)  Result Value Ref Range   Enterococcus species NOT DETECTED NOT DETECTED   Listeria monocytogenes NOT DETECTED NOT DETECTED   Staphylococcus species DETECTED (A) NOT DETECTED   Staphylococcus aureus (BCID) NOT DETECTED NOT DETECTED   Methicillin resistance NOT DETECTED NOT DETECTED   Streptococcus species NOT DETECTED NOT DETECTED   Streptococcus agalactiae NOT DETECTED NOT DETECTED   Streptococcus pneumoniae NOT DETECTED NOT DETECTED   Streptococcus pyogenes NOT DETECTED NOT DETECTED   Acinetobacter baumannii NOT DETECTED NOT DETECTED   Enterobacteriaceae species NOT DETECTED NOT DETECTED   Enterobacter cloacae complex NOT DETECTED NOT DETECTED   Escherichia coli NOT DETECTED NOT DETECTED   Klebsiella oxytoca NOT DETECTED NOT DETECTED   Klebsiella pneumoniae NOT DETECTED NOT DETECTED   Proteus species NOT DETECTED NOT DETECTED   Serratia marcescens NOT DETECTED NOT DETECTED   Haemophilus influenzae NOT DETECTED NOT DETECTED   Neisseria meningitidis NOT DETECTED NOT DETECTED   Pseudomonas aeruginosa NOT DETECTED NOT DETECTED   Candida albicans NOT DETECTED NOT DETECTED   Candida glabrata NOT DETECTED NOT DETECTED   Candida krusei NOT DETECTED NOT DETECTED   Candida parapsilosis NOT DETECTED NOT DETECTED   Candida tropicalis NOT DETECTED NOT DETECTED    Thomasene Rippleavid  Imaya Duffy, PharmD 01/01/2018  6:47 AM

## 2018-01-01 NOTE — Consult Note (Signed)
NAME: [name]  DOB: 1960-04-14  MRN: [id number]  Date/Time: 01/01/2018 3:30 PM  PAtel Subjective:  REASON FOR CONSULT: left PJI ? [name] is a 57 y.o.male  with a history of Bipolar disorder b/l TKA ias admitted with painful swelling of left knee. Pt on 10/30/17 underwent left TKA, and when the sutures were removed he noted some discharge and  pain. He came to the ED on 11/25 and was given keflex for superficial infection. He has not had relief and the swelling got worse with increasing pain and redness at the surgical site. He had subjective fever and chills. He came to the Ed on 12/22. Vitals were BP 121/78, pulse 100, temperature 99.7 F (37.6 C),  WBC was 6.7, cr 0.93. 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 Says he was told about HEPC and that he had cleared it without treatment.  He had rt TKA in 2017 following which he had pericarditis . He also had severe reflux esophagitis on a EGD Past Medical History:  Diagnosis Date  . Anxiety   . Arthritis    knees and hands  . Bipolar disorder (HCC)   . Depression   . GERD (gastroesophageal reflux disease)   . Hepatitis    HEP "C"  . History of kidney stones   . Hypertension   . Kidney stones   . Pericarditis 05/2015   a. echo 5/17: EF 60-65%, no RWMA, LV dias fxn nl, LA mildly dilated, RV sys fxn nl, PASP nl, moderate sized circumferential pericardial effusion was identified, 2.12 cm around the LV free wall, <1 cm around the RV free wall. Features were not c/w tamponade physiology  . PTSD (post-traumatic stress disorder)    Witnessed brother's suicide.  Marland Kitchen Restless leg syndrome   . Syncope     Past Surgical History:  Procedure Laterality Date  . CYSTOSCOPY WITH URETEROSCOPY AND STENT PLACEMENT    . ESOPHAGOGASTRODUODENOSCOPY N/A 01/11/2016   Procedure: ESOPHAGOGASTRODUODENOSCOPY  (EGD);  Surgeon: Charlott Rakes, MD;  Location: [facility] ENDOSCOPY;  Service: Endoscopy;  Laterality: N/A;  . JOINT REPLACEMENT Right    TKR  . KNEE ARTHROSCOPY Right 06/25/2014   Procedure: ARTHROSCOPY KNEE;  Surgeon: Deeann Saint, MD;  Location: [facility] ORS;  Service: Orthopedics;  Laterality: Right;  partial arthroscopic medial menisectomy  . TOTAL KNEE ARTHROPLASTY Right 04/22/2015   Procedure: TOTAL KNEE ARTHROPLASTY;  Surgeon: Deeann Saint, MD;  Location: [facility] ORS;  Service: Orthopedics;  Laterality: Right;  . TOTAL KNEE ARTHROPLASTY Left 10/30/2017   Procedure: TOTAL KNEE ARTHROPLASTY;  Surgeon: Deeann Saint, MD;  Location: [facility] ORS;  Service: Orthopedics;  Laterality: Left;    Social History   Socioeconomic History  . Marital status: Single    Spouse name: Not on file  . Number of children: Not on file  . Years of education: Not on file  . Highest education level: Not on file  Occupational History  . Not on file  Social Needs  . Financial resource strain: Not on file  . Food insecurity:    Worry: Not on file    Inability: Not on file  . Transportation needs:    Medical: Not on file    Non-medical: Not on file  Tobacco Use  . Smoking status: Former Smoker    Packs/day: 0.75    Years:  20.00    Pack years: 15.00    Types: Cigarettes    Last attempt to quit: 05/16/1984    Years since quitting: 33.6  . Smokeless tobacco: Never Used  Substance and Sexual Activity  . Alcohol use: Yes    Comment: occ  . Drug use: No  . Sexual activity: Not on file  Lifestyle  . Physical activity:    Days per week: Not on file    Minutes per session: Not on file  . Stress: Not on file  Relationships  . Social connections:    Talks on phone: Not on file    Gets together: Not on file    Attends religious service: Not on file    Active member of club or organization: Not on file    Attends meetings of clubs or organizations: Not on file    Relationship status: Not on file  . Intimate  partner violence:    Fear of current or ex partner: Not on file    Emotionally abused: Not on file    Physically abused: Not on file    Forced sexual activity: Not on file  Other Topics Concern  . Not on file  Social History Narrative  . Not on file    Family History  Problem Relation Age of Onset  . CVA Mother        deceased at age 12  . Depression Brother        Died by suicide at age 63   Allergies  Allergen Reactions  . Toradol [Ketorolac Tromethamine] Hives   Current Facility-Administered Medications  Medication Dose Route Frequency Provider Last Rate Last Dose  . 0.9 %  sodium chloride infusion   Intravenous Continuous Enedina Finner, MD 125 mL/hr at 01/01/18 0800    . acetaminophen (TYLENOL) tablet 650 mg  650 mg Oral Q6H PRN Barbaraann Rondo, MD       Or  . acetaminophen (TYLENOL) suppository 650 mg  650 mg Rectal Q6H PRN Barbaraann Rondo, MD      . bisacodyl (DULCOLAX) EC tablet 5 mg  5 mg Oral Daily PRN Barbaraann Rondo, MD      . ceFAZolin (ANCEF) IVPB 2g/100 mL premix  2 g Intravenous Q6H Deeann Saint, MD 200 mL/hr at 01/01/18 1153 2 g at 01/01/18 1153  . [START ON 01/02/2018] chlorhexidine (HIBICLENS) 4 % liquid 1 application  1 application Topical Once Deeann Saint, MD      . Melene Muller ON 01/02/2018] Chlorhexidine Gluconate Cloth 2 % PADS 6 each  6 each Topical Q0600 Enedina Finner, MD      . divalproex (DEPAKOTE) DR tablet 500 mg  500 mg Oral BID Barbaraann Rondo, MD   500 mg at 01/01/18 0953  . [START ON 01/02/2018] feeding supplement (ENSURE ENLIVE) (ENSURE ENLIVE) liquid 237 mL  237 mL Oral BID BM Enedina Finner, MD      . FLUoxetine (PROZAC) capsule 80 mg  80 mg Oral Daily Barbaraann Rondo, MD   80 mg at 01/01/18 0953  . gabapentin (NEURONTIN) capsule 400 mg  400 mg Oral BID Barbaraann Rondo, MD   400 mg at 01/01/18 0953  . HYDROcodone-acetaminophen (NORCO/VICODIN) 5-325 MG per tablet 1-2 tablet  1-2 tablet Oral Q6H PRN Enedina Finner, MD   2 tablet at  01/01/18 1427  . morphine 2 MG/ML injection 1-2 mg  1-2 mg Intravenous Q4H PRN Enedina Finner, MD   2 mg at 01/01/18 1110  . mupirocin ointment (BACTROBAN) 2 % 1 application  1 application Nasal BID Enedina FinnerPatel, Sona, MD   1 application at 01/01/18 1424  . ondansetron (ZOFRAN) tablet 4 mg  4 mg Oral Q6H PRN Barbaraann RondoSridharan, Prasanna, MD       Or  . ondansetron (ZOFRAN) injection 4 mg  4 mg Intravenous Q6H PRN Barbaraann RondoSridharan, Prasanna, MD      . QUEtiapine (SEROQUEL XR) 24 hr tablet 400 mg  400 mg Oral QHS Barbaraann RondoSridharan, Prasanna, MD   400 mg at 12/31/17 2227  . QUEtiapine (SEROQUEL) tablet 50 mg  50 mg Oral QID PRN Barbaraann RondoSridharan, Prasanna, MD      . senna-docusate (Senokot-S) tablet 1 tablet  1 tablet Oral QHS PRN Barbaraann RondoSridharan, Prasanna, MD      . Melene Muller[START ON 01/02/2018] tranexamic acid (CYKLOKAPRON) 2,500 mg in sodium chloride 0.9 % 250 mL (10 mg/mL) infusion  1.5 mg/kg/hr Intravenous To OR Deeann SaintMiller, Howard, MD         Abtx:  Anti-infectives (From admission, onward)   Start     Dose/Rate Route Frequency Ordered Stop   01/01/18 1200  ceFAZolin (ANCEF) IVPB 2g/100 mL premix     2 g 200 mL/hr over 30 Minutes Intravenous Every 6 hours 01/01/18 1027     12/31/17 2200  ceFAZolin (ANCEF) IVPB 2g/100 mL premix  Status:  Discontinued     2 g 200 mL/hr over 30 Minutes Intravenous Every 8 hours 12/31/17 2052 01/01/18 1027   12/31/17 0800  vancomycin (VANCOCIN) IVPB 1000 mg/200 mL premix  Status:  Discontinued     1,000 mg 200 mL/hr over 60 Minutes Intravenous Every 8 hours 12/31/17 0308 12/31/17 2052   12/31/17 0045  vancomycin (VANCOCIN) IVPB 1000 mg/200 mL premix     1,000 mg 200 mL/hr over 60 Minutes Intravenous  Once 12/31/17 0044 12/31/17 0246   12/31/17 0045  cefTRIAXone (ROCEPHIN) 2 g in sodium chloride 0.9 % 100 mL IVPB  Status:  Discontinued     2 g 200 mL/hr over 30 Minutes Intravenous Every 24 hours 12/31/17 0044 12/31/17 2052      REVIEW OF SYSTEMS:  Const:  fever,  chills, negative weight loss Eyes: negative  diplopia or visual changes, negative eye pain ENT: negative coryza, negative sore throat Resp: negative cough, hemoptysis, dyspnea Cards: negative for chest pain, palpitations, lower extremity edema GU: negative for frequency, dysuria and hematuria GI: Negative for abdominal pain, diarrhea, bleeding, constipation Skin: negative for rash and pruritus Heme: negative for easy bruising and gum/nose bleeding MS: left knee pain Neurolo:negative for headaches, dizziness, vertigo, memory problems  Psych: negative for feelings of anxiety, depression  Endocrine:no polyuria or polydipsia Allergy/Immunology- negative for any medication or food allergies ?  Objective:  VITALS:  BP 116/69 (BP Location: Left Arm)   Pulse 94   Temp 97.7 F (36.5 C) (Oral)   Resp 18   Ht 6' (1.829 m)   Wt 81.3 kg   SpO2 99%   BMI 24.30 kg/m  PHYSICAL EXAM:  General: Alert, cooperative, no distress, appears stated age. pale Head: Normocephalic, without obvious abnormality, atraumatic. Eyes: Conjunctivae clear, anicteric sclerae. Pupils are equal ENT Nares normal. No drainage or sinus tenderness. Lips, mucosa, and tongue normal. No Thrush Neck: Supple, symmetrical, no adenopathy, thyroid: non tender no carotid bruit and no JVD. Back: No CVA tenderness. Lungs: Clear to auscultation bilaterally. No Wheezing or Rhonchi. No rales. Heart: Regular rate and rhythm, no murmur, rub or gallop. Abdomen: Soft, non-tender,not distended. Bowel sounds normal. No masses Extremities: rt knee scar healthy- left knee swollen-  erythema over the scar. Couple of scabs present- warm, tender , movt restricted Skin: No rashes or lesions. Or bruising Lymph: Cervical, supraclavicular normal. Neurologic: Grossly non-focal Pertinent Labs Lab Results CBC    Component Value Date/Time   WBC 6.7 12/30/2017 2251   RBC 3.94 (L) 12/30/2017 2251   HGB 10.7 (L) 12/30/2017 2251   HCT 32.9 (L) 12/30/2017 2251   PLT 236 12/30/2017 2251    MCV 83.5 12/30/2017 2251   MCH 27.2 12/30/2017 2251   MCHC 32.5 12/30/2017 2251   RDW 15.1 12/30/2017 2251   LYMPHSABS 0.7 12/30/2017 2251   MONOABS 0.4 12/30/2017 2251   EOSABS 0.0 12/30/2017 2251   BASOSABS 0.0 12/30/2017 2251    CMP Latest Ref Rng & Units 12/30/2017 12/04/2017 10/31/2017  Glucose 70 - 99 mg/dL 782(N) 562(Z) 308(M)  BUN 6 - 20 mg/dL 13 20 11   Creatinine 0.61 - 1.24 mg/dL 5.78 4.69 6.29  Sodium 135 - 145 mmol/L 133(L) 136 139  Potassium 3.5 - 5.1 mmol/L 3.6 4.2 3.9  Chloride 98 - 111 mmol/L 105 102 110  CO2 22 - 32 mmol/L 17(L) 24 23  Calcium 8.9 - 10.3 mg/dL 9.1 9.9 5.2(W)  Total Protein 6.5 - 8.1 g/dL 7.9 - -  Total Bilirubin 0.3 - 1.2 mg/dL 0.6 - -  Alkaline Phos 38 - 126 U/L 82 - -  AST 15 - 41 U/L 23 - -  ALT 0 - 44 U/L 15 - -      Microbiology: Recent Results (from the past 240 hour(s))  Blood culture (routine x 2)     Status: Abnormal (Preliminary result)   Collection Time: 12/30/17 10:51 PM  Result Value Ref Range Status   Specimen Description BLOOD RIGHT FOREARM  Final   Special Requests   Final    BOTTLES DRAWN AEROBIC AND ANAEROBIC Blood Culture results may not be optimal due to an excessive volume of blood received in culture bottles Performed at [facility], [address] [place], [place] [address]    Culture  Setup Time   Final    AEROBIC BOTTLE ONLY GRAM POSITIVE COCCI CRITICAL RESULT CALLED TO, READ BACK BY AND VERIFIED WITH: CHRIS NESBITT ON 12/31/17 AT 2030 SRC GRAM STAIN REVIEWED-AGREE WITH RESULT    Culture (A)  Final    STAPHYLOCOCCUS SPECIES (COAGULASE NEGATIVE) THE SIGNIFICANCE OF ISOLATING THIS ORGANISM FROM A SINGLE SET OF BLOOD CULTURES WHEN MULTIPLE SETS ARE DRAWN IS UNCERTAIN. PLEASE NOTIFY THE MICROBIOLOGY DEPARTMENT WITHIN ONE WEEK IF SPECIATION AND SENSITIVITIES ARE REQUIRED. Performed at [facility], [address], [place], [place] [address]    Report Status PENDING  Incomplete  Blood Culture ID  Panel (Reflexed)     Status: Abnormal   Collection Time: 12/30/17 10:51 PM  Result Value Ref Range Status   Enterococcus species NOT DETECTED NOT DETECTED Final   Listeria monocytogenes NOT DETECTED NOT DETECTED Final   Staphylococcus species DETECTED (A) NOT DETECTED Final    Comment: Methicillin (oxacillin) susceptible coagulase negative staphylococcus. Possible blood culture contaminant (unless isolated from more than one blood culture draw or clinical case suggests pathogenicity). No antibiotic treatment is indicated for blood  culture contaminants. CRITICAL RESULT CALLED TO, READ BACK BY AND VERIFIED WITH: CHRIS NESBITT ON 12/31/17 AT 2030 SRC    Staphylococcus aureus (BCID) NOT DETECTED NOT DETECTED Final   Methicillin resistance NOT DETECTED NOT DETECTED Final   Streptococcus species NOT DETECTED NOT DETECTED Final   Streptococcus agalactiae NOT DETECTED NOT  DETECTED Final   Streptococcus pneumoniae NOT DETECTED NOT DETECTED Final   Streptococcus pyogenes NOT DETECTED NOT DETECTED Final   Acinetobacter baumannii NOT DETECTED NOT DETECTED Final   Enterobacteriaceae species NOT DETECTED NOT DETECTED Final   Enterobacter cloacae complex NOT DETECTED NOT DETECTED Final   Escherichia coli NOT DETECTED NOT DETECTED Final   Klebsiella oxytoca NOT DETECTED NOT DETECTED Final   Klebsiella pneumoniae NOT DETECTED NOT DETECTED Final   Proteus species NOT DETECTED NOT DETECTED Final   Serratia marcescens NOT DETECTED NOT DETECTED Final   Haemophilus influenzae NOT DETECTED NOT DETECTED Final   Neisseria meningitidis NOT DETECTED NOT DETECTED Final   Pseudomonas aeruginosa NOT DETECTED NOT DETECTED Final   Candida albicans NOT DETECTED NOT DETECTED Final   Candida glabrata NOT DETECTED NOT DETECTED Final   Candida krusei NOT DETECTED NOT DETECTED Final   Candida parapsilosis NOT DETECTED NOT DETECTED Final   Candida tropicalis NOT DETECTED NOT DETECTED Final    Comment: Performed at  [facility], [address] [place], [place] [address]  Blood culture (routine x 2)     Status: None (Preliminary result)   Collection Time: 12/31/17 12:36 AM  Result Value Ref Range Status   Specimen Description BLOOD LEFT EXTERNAL JUGULAR  Final   Special Requests   Final    BOTTLES DRAWN AEROBIC AND ANAEROBIC Blood Culture results may not be optimal due to an excessive volume of blood received in culture bottles   Culture  Setup Time   Final    GRAM POSITIVE COCCI AEROBIC BOTTLE ONLY CRITICAL RESULT CALLED TO, READ BACK BY AND VERIFIED WITH: DAVID BESANTI ON 01/01/18 AT 0533 QSD Performed at [facility], [address] [place], [place] [address]    Culture GRAM POSITIVE COCCI  Final   Report Status PENDING  Incomplete  Body fluid culture     Status: None (Preliminary result)   Collection Time: 12/31/17 12:36 AM  Result Value Ref Range Status   Specimen Description   Final    FLUID LEFT KNEE Performed at [facility], [address] [place], [place] [address]    Special Requests   Final    NONE Performed at [facility], [address] [place], [place] [address]    Gram Stain   Final    ABUNDANT WBC PRESENT, PREDOMINANTLY PMN RARE GRAM POSITIVE COCCI IN PAIRS Performed at [facility], [address], [place], [place] [address]    Culture FEW STAPHYLOCOCCUS AUREUS  Final   Report Status PENDING  Incomplete  MRSA PCR Screening     Status: Abnormal   Collection Time: 01/01/18 11:25 AM  Result Value Ref Range Status   MRSA by PCR POSITIVE (A) NEGATIVE Final    Comment:        The GeneXpert MRSA Assay (FDA approved for NASAL specimens only), is one component of a comprehensive MRSA colonization surveillance program. It is not intended to diagnose MRSA infection nor to guide or monitor treatment for MRSA infections. RESULT CALLED TO, READ BACK BY AND VERIFIED WITH:  JESSICA [facility] AT 1300 01/01/18 SDR Performed at  [facility], [address] [place], [place] [address]    IMAGING RESULTS: ? Impression/Recommendation ?57 y.o.male  with a history of Bipolar disorder b/l TKA ias admitted with painful swelling of left knee. Pt on 10/30/17 underwent left TKA, and when the sutures were removed he noted some discharge and  pain. He came to the ED on  11/25 and was given keflex for superficial infection. He has not had relief and the swelling got worse with increasing pain and redness at the surgical site. He had subjective fever and chills. ?  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 He is waiting to have wash out and poly-exchange . Will need long term Iv antibiotics followed by PO  Coag neg staph in blood likely skin contaminant, will repeat blood culture ? Bipolar disorder   Past h/o IVDA- but clean in many years -   H/o HEPC- will check RNA to see whether there is active infection _______________________________________________ Discussed with patient in great detail

## 2018-01-01 NOTE — Progress Notes (Signed)
Pharmacy Antibiotic Note  Ivar DrapeJames C Lague is a 57 y.o. 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  Pharmacy has been consulted for vancomycin dosing. S. Aureus in knee fluid, interestingly, Coag-negative staphylococci in blood cxs.     Plan:  Vancomycin 1250mg  IV q12h  For estimated Pk = 37, T = 15, ke = 0.86, t1/2 = 8, Vd = 52.8  Check SCR in am, trough prior to 5th dose if remains on vancomycin   F/u susceptibilities  Follow renal function  Remains on cefazolin - adjust to 2gm q8h  Height: 6' (182.9 cm) Weight: 179 lb 3.2 oz (81.3 kg) IBW/kg (Calculated) : 77.6  Temp (24hrs), Avg:98.3 F (36.8 C), Min:97.7 F (36.5 C), Max:99.1 F (37.3 C)  Recent Labs  Lab 12/30/17 2251 12/31/17 0207  WBC 6.7  --   CREATININE 0.93  --   LATICACIDVEN 2.5* 1.0    Estimated Creatinine Clearance: 98.5 mL/min (by C-G formula based on SCr of 0.93 mg/dL).    Allergies  Allergen Reactions  . Toradol [Ketorolac Tromethamine] Hives    Antimicrobials this admission: 12/22 Cefazolin >> 12/23 vanco >>  Dose adjustments this admission:  Microbiology results: 12/22 BCx: 2/2 Methicillin susc coag neg staph 12/22 L knee: Few S. aureus MRSA PCR: positive  Thank you for allowing pharmacy to be a part of this patient's care.  Juliette Alcideustin Imanuel Pruiett, PharmD, BCPS.   Work Cell: [phone] 01/01/2018 4:30 PM

## 2018-01-01 NOTE — Progress Notes (Signed)
Initial Nutrition Assessment  DOCUMENTATION CODES:   Not applicable  INTERVENTION:  Recommend liberalizing diet to regular.  Provide Ensure Enlive po BID, each supplement provides 350 kcal and 20 grams of protein. Patient prefers chocolate.  Encouraged adequate intake of calories and protein at meals.  NUTRITION DIAGNOSIS:   Increased nutrient needs related to post-op healing as evidenced by estimated needs.  GOAL:   Patient will meet greater than or equal to 90% of their needs  MONITOR:   PO intake, Supplement acceptance, Labs, Weight trends, Skin, I & O's  REASON FOR ASSESSMENT:   Malnutrition Screening Tool    ASSESSMENT:   57 year old male with PMHx of arthritis, depression, restless leg syndrome, bipolar disorder, anxiety, PTSD, GERD, hepatitis C, hx pericarditis 05/2015, HTN, hx right TKA on 04/22/2015, recent left TKA 10/30/2017 who is now admitted with left knee and right hip pain, sepsis.   -Plan is for washout and poly-exchange with possible component removal and insertion of spacer tomorrow in OR.  Met with patient at bedside. He reports a decreased appetite since admission from pain in left knee and right hip. Yesterday he was eating <25% of meals. He reports his appetite has improved some today and he has finished about 75-90% of meals today. He reports that PTA he had a good appetite that was unchanged from baseline. He eats 3 well-balanced meals daily. Patient enjoys chocolate Ensure and would like to drink some here.  Patient reports his UBW is around 180 lbs though it fluctuates +/- 5 lbs. Patient is currently 81.3 kg (179.2 lbs). He reports there has been no weight loss following recent TKA.  Medications reviewed and include: gabapentin, Seroquel QHS, NS @ 125 mL/hr, cefazolin.  Labs reviewed.  Patient does not meet criteria for malnutrition at this time.  Discussed with RN.  NUTRITION - FOCUSED PHYSICAL EXAM:    Most Recent Value  Orbital Region  No  depletion  Upper Arm Region  Mild depletion  Thoracic and Lumbar Region  No depletion  Buccal Region  No depletion  Temple Region  Mild depletion  Clavicle Bone Region  No depletion  Clavicle and Acromion Bone Region  No depletion  Scapular Bone Region  No depletion  Dorsal Hand  No depletion  Patellar Region  Moderate depletion  Anterior Thigh Region  Moderate depletion  Posterior Calf Region  Mild depletion  Edema (RD Assessment)  None  Hair  Reviewed  Eyes  Reviewed  Mouth  Reviewed  Skin  Reviewed  Nails  Reviewed     Diet Order:   Diet Order            Diet NPO time specified  Diet effective midnight        Diet Heart Room service appropriate? Yes; Fluid consistency: Thin  Diet effective now             EDUCATION NEEDS:   Education needs have been addressed  Skin:  Skin Assessment: Skin Integrity Issues:(closed incision to left knee)  Last BM:  12/30/2017 per chart  Height:   Ht Readings from Last 1 Encounters:  12/31/17 6' (1.829 m)   Weight:   Wt Readings from Last 1 Encounters:  12/31/17 81.3 kg   Ideal Body Weight:  80.9 kg  BMI:  Body mass index is 24.3 kg/m.  Estimated Nutritional Needs:   Kcal:  2030-2360 (MSJ x 1.2-1.4)  Protein:  95-105 grams (1.2-1.3 grams/kg)  Fluid:  2.4-2.8 L/day (30-35 mL/kg)  Willey Blade, MS,  RD, LDN Office: [phone] Pager: [phone] After Hours/Weekend Pager: [phone]  

## 2018-01-02 ENCOUNTER — Inpatient Hospital Stay: Payer: Medicaid Other | Admitting: Certified Registered"

## 2018-01-02 ENCOUNTER — Encounter
Admission: EM | Disposition: A | Discharge status: Home Health | Payer: Self-pay | Source: Home / Self Care | Attending: Internal Medicine

## 2018-01-02 ENCOUNTER — Encounter: Payer: Self-pay | Admitting: *Deleted

## 2018-01-02 HISTORY — PX: TOTAL KNEE REVISION: SHX996

## 2018-01-02 HISTORY — PX: INCISION AND DRAINAGE ABSCESS: SHX5864

## 2018-01-02 LAB — BODY FLUID CULTURE

## 2018-01-02 LAB — CREATININE, SERUM
Creatinine, Ser: 0.79 mg/dL (ref 0.61–1.24)
GFR calc Af Amer: >60 mL/min (ref >60)
GFR calc non Af Amer: >60 mL/min (ref >60)

## 2018-01-02 LAB — HIV ANTIBODY (ROUTINE TESTING W REFLEX): HIV Screen 4th Generation wRfx: NONREACTIVE

## 2018-01-02 SURGERY — INCISION AND DRAINAGE, ABSCESS
Anesthesia: General | Site: Knee | Laterality: Left

## 2018-01-02 MED ORDER — PROPOFOL 500 MG/50ML IV EMUL
INTRAVENOUS | Status: AC
  Filled 2018-01-02: qty 50

## 2018-01-02 MED ORDER — MENTHOL 3 MG MT LOZG
1.0000 | LOZENGE | OROMUCOSAL | Status: DC | PRN
  Filled 2018-01-02: qty 9

## 2018-01-02 MED ORDER — SUGAMMADEX SODIUM 200 MG/2ML IV SOLN
INTRAVENOUS | Status: AC
  Filled 2018-01-02: qty 2

## 2018-01-02 MED ORDER — PHENYLEPHRINE HCL 10 MG/ML IJ SOLN
INTRAMUSCULAR | Status: AC
  Filled 2018-01-02: qty 1

## 2018-01-02 MED ORDER — PROPOFOL 10 MG/ML IV BOLUS
INTRAVENOUS | Status: DC | PRN
  Administered 2018-01-02: 170 mg via INTRAVENOUS

## 2018-01-02 MED ORDER — METOCLOPRAMIDE HCL 10 MG PO TABS: 5.0000 mg | ORAL_TABLET | Freq: Three times a day (TID) | ORAL | Status: DC | PRN

## 2018-01-02 MED ORDER — ONDANSETRON HCL 4 MG/2ML IJ SOLN
INTRAMUSCULAR | Status: DC | PRN
  Administered 2018-01-02: 4 mg via INTRAVENOUS

## 2018-01-02 MED ORDER — ASPIRIN 81 MG PO CHEW
81.0000 mg | CHEWABLE_TABLET | Freq: Two times a day (BID) | ORAL | Status: DC
  Administered 2018-01-02: 81 mg via ORAL
  Filled 2018-01-02: qty 1

## 2018-01-02 MED ORDER — METOCLOPRAMIDE HCL 5 MG/ML IJ SOLN: 5.0000 mg | Freq: Three times a day (TID) | INTRAMUSCULAR | Status: DC | PRN

## 2018-01-02 MED ORDER — PHENYLEPHRINE HCL 10 MG/ML IJ SOLN
INTRAMUSCULAR | Status: DC | PRN
  Administered 2018-01-02 (×5): 100 ug via INTRAVENOUS

## 2018-01-02 MED ORDER — LIDOCAINE HCL (PF) 2 % IJ SOLN
INTRAMUSCULAR | Status: AC
  Filled 2018-01-02: qty 10

## 2018-01-02 MED ORDER — TRANEXAMIC ACID-NACL 1000-0.7 MG/100ML-% IV SOLN
INTRAVENOUS | Status: DC | PRN
  Administered 2018-01-02: 1000 mg via INTRAVENOUS

## 2018-01-02 MED ORDER — PHENOL 1.4 % MT LIQD
1.0000 | OROMUCOSAL | Status: DC | PRN
  Filled 2018-01-02: qty 177

## 2018-01-02 MED ORDER — DOCUSATE SODIUM 100 MG PO CAPS
100.0000 mg | ORAL_CAPSULE | Freq: Two times a day (BID) | ORAL | Status: DC
  Administered 2018-01-02 – 2018-01-04 (×4): 100 mg via ORAL
  Filled 2018-01-02 (×4): qty 1

## 2018-01-02 MED ORDER — ONDANSETRON HCL 4 MG/2ML IJ SOLN: 4.0000 mg | Freq: Four times a day (QID) | INTRAMUSCULAR | Status: DC | PRN

## 2018-01-02 MED ORDER — ONDANSETRON HCL 4 MG PO TABS: 4.0000 mg | ORAL_TABLET | Freq: Four times a day (QID) | ORAL | Status: DC | PRN

## 2018-01-02 MED ORDER — ESMOLOL HCL 100 MG/10ML IV SOLN
INTRAVENOUS | Status: AC
  Filled 2018-01-02: qty 10

## 2018-01-02 MED ORDER — LIDOCAINE HCL (CARDIAC) PF 100 MG/5ML IV SOSY
PREFILLED_SYRINGE | INTRAVENOUS | Status: DC | PRN
  Administered 2018-01-02: 100 mg via INTRAVENOUS

## 2018-01-02 MED ORDER — NEOMYCIN-POLYMYXIN B GU 40-200000 IR SOLN
Status: DC | PRN
  Administered 2018-01-02: 16 mL

## 2018-01-02 MED ORDER — FENTANYL CITRATE (PF) 100 MCG/2ML IJ SOLN
INTRAMUSCULAR | Status: DC | PRN
  Administered 2018-01-02 (×2): 50 ug via INTRAVENOUS
  Administered 2018-01-02: 100 ug via INTRAVENOUS
  Administered 2018-01-02: 50 ug via INTRAVENOUS

## 2018-01-02 MED ORDER — SUGAMMADEX SODIUM 200 MG/2ML IV SOLN
INTRAVENOUS | Status: DC | PRN
  Administered 2018-01-02: 165 mg via INTRAVENOUS

## 2018-01-02 MED ORDER — TRANEXAMIC ACID 1000 MG/10ML IV SOLN
INTRAVENOUS | Status: AC
  Filled 2018-01-02: qty 10

## 2018-01-02 MED ORDER — SODIUM CHLORIDE 0.9 % IV SOLN
INTRAVENOUS | Status: DC | PRN
  Administered 2018-01-02: 20 ug/min via INTRAVENOUS

## 2018-01-02 MED ORDER — LACTATED RINGERS IV SOLN
INTRAVENOUS | Status: DC | PRN
  Administered 2018-01-02: 08:00:00 via INTRAVENOUS

## 2018-01-02 MED ORDER — ESMOLOL HCL 100 MG/10ML IV SOLN
INTRAVENOUS | Status: DC | PRN
  Administered 2018-01-02: 40 mg via INTRAVENOUS

## 2018-01-02 MED ORDER — MIDAZOLAM HCL 2 MG/2ML IJ SOLN
INTRAMUSCULAR | Status: AC
  Filled 2018-01-02: qty 2

## 2018-01-02 MED ORDER — MIDAZOLAM HCL 2 MG/2ML IJ SOLN
INTRAMUSCULAR | Status: DC | PRN
  Administered 2018-01-02: 2 mg via INTRAVENOUS

## 2018-01-02 MED ORDER — VASOPRESSIN 20 UNIT/ML IV SOLN
INTRAVENOUS | Status: DC | PRN
  Administered 2018-01-02: 2 [IU] via INTRAVENOUS

## 2018-01-02 MED ORDER — HYDROMORPHONE HCL 1 MG/ML IJ SOLN
INTRAMUSCULAR | Status: DC | PRN
  Administered 2018-01-02 (×2): 0.5 mg via INTRAVENOUS

## 2018-01-02 MED ORDER — SUCCINYLCHOLINE CHLORIDE 20 MG/ML IJ SOLN
INTRAMUSCULAR | Status: DC | PRN
  Administered 2018-01-02: 100 mg via INTRAVENOUS

## 2018-01-02 MED ORDER — HYDROMORPHONE HCL 1 MG/ML IJ SOLN
INTRAMUSCULAR | Status: AC
  Filled 2018-01-02: qty 1

## 2018-01-02 MED ORDER — LACTATED RINGERS IV SOLN: INTRAVENOUS | Status: DC

## 2018-01-02 MED ORDER — LACTATED RINGERS IV SOLN
INTRAVENOUS | Status: DC | PRN
  Administered 2018-01-02: 09:00:00 via INTRAVENOUS

## 2018-01-02 MED ORDER — FENTANYL CITRATE (PF) 250 MCG/5ML IJ SOLN
INTRAMUSCULAR | Status: AC
  Filled 2018-01-02: qty 5

## 2018-01-02 MED ORDER — ROCURONIUM BROMIDE 50 MG/5ML IV SOLN
INTRAVENOUS | Status: AC
  Filled 2018-01-02: qty 1

## 2018-01-02 MED ORDER — ROCURONIUM BROMIDE 100 MG/10ML IV SOLN
INTRAVENOUS | Status: DC | PRN
  Administered 2018-01-02: 5 mg via INTRAVENOUS
  Administered 2018-01-02: 45 mg via INTRAVENOUS
  Administered 2018-01-02: 10 mg via INTRAVENOUS
  Administered 2018-01-02: 20 mg via INTRAVENOUS

## 2018-01-02 MED ORDER — GLYCOPYRROLATE 0.2 MG/ML IJ SOLN
INTRAMUSCULAR | Status: AC
  Filled 2018-01-02: qty 1

## 2018-01-02 SURGICAL SUPPLY — 62 items
AUTOTRANSFUS HAS 1/8 (MISCELLANEOUS)
BANDAGE ELASTIC 4 LF NS (GAUZE/BANDAGES/DRESSINGS) ×4 IMPLANT
BANDAGE ELASTIC 6 LF NS (GAUZE/BANDAGES/DRESSINGS) ×4 IMPLANT
BLADE SAGITTAL WIDE XTHICK NO (BLADE) ×4 IMPLANT
BNDG ESMARK 4X12 TAN STRL LF (GAUZE/BANDAGES/DRESSINGS) ×4 IMPLANT
CANISTER SUCT 1200ML W/VALVE (MISCELLANEOUS) ×4 IMPLANT
CANISTER SUCT 3000ML PPV (MISCELLANEOUS) ×4 IMPLANT
CAST PADDING 3X4FT ST 30246 (SOFTGOODS) ×4
CHLORAPREP W/TINT 26ML (MISCELLANEOUS) ×6 IMPLANT
CNTNR SPEC 2.5X3XGRAD LEK (MISCELLANEOUS) ×2
CONT SPEC 4OZ STER OR WHT (MISCELLANEOUS) ×2
CONTAINER SPEC 2.5X3XGRAD LEK (MISCELLANEOUS) IMPLANT
COOLER POLAR GLACIER W/PUMP (MISCELLANEOUS) ×4 IMPLANT
COVER WAND RF STERILE (DRAPES) ×2 IMPLANT
CUFF TOURN 24 STER (MISCELLANEOUS) ×2 IMPLANT
CUFF TOURN 30 STER DUAL PORT (MISCELLANEOUS) IMPLANT
DRSG AQUACEL AG ADV 3.5X10 (GAUZE/BANDAGES/DRESSINGS) ×4 IMPLANT
ELECT REM PT RETURN 9FT ADLT (ELECTROSURGICAL) ×4
ELECTRODE REM PT RTRN 9FT ADLT (ELECTROSURGICAL) ×2 IMPLANT
GAUZE PETRO XEROFOAM 1X8 (MISCELLANEOUS) ×4 IMPLANT
GAUZE SPONGE 4X4 12PLY STRL (GAUZE/BANDAGES/DRESSINGS) ×4 IMPLANT
GAUZE XEROFORM 4X4 STRL (GAUZE/BANDAGES/DRESSINGS) ×2 IMPLANT
GLOVE BIO SURGEON STRL SZ8 (GLOVE) ×4 IMPLANT
GLOVE INDICATOR 8.0 STRL GRN (GLOVE) ×4 IMPLANT
GOWN STRL REUS W/ TWL LRG LVL3 (GOWN DISPOSABLE) ×2 IMPLANT
GOWN STRL REUS W/TWL LRG LVL3 (GOWN DISPOSABLE) ×4
GOWN STRL REUS W/TWL LRG LVL4 (GOWN DISPOSABLE) ×4 IMPLANT
HANDLE YANKAUER SUCT BULB TIP (MISCELLANEOUS) ×2 IMPLANT
IMMBOLIZER KNEE 19 BLUE UNIV (SOFTGOODS) ×4 IMPLANT
INSERT TIB LCS RP LRG 10 (Knees) ×2 IMPLANT
KIT TURNOVER KIT A (KITS) ×4 IMPLANT
NS IRRIG 1000ML POUR BTL (IV SOLUTION) ×4 IMPLANT
PACK EXTREMITY ARMC (MISCELLANEOUS) ×4 IMPLANT
PACK TOTAL KNEE (MISCELLANEOUS) ×4 IMPLANT
PAD ABD DERMACEA PRESS 5X9 (GAUZE/BANDAGES/DRESSINGS) ×4 IMPLANT
PAD CAST CTTN 3X4 STRL (SOFTGOODS) ×4 IMPLANT
PAD DE MAYO PRESSURE PROTECT (MISCELLANEOUS) ×2 IMPLANT
PAD PREP 24X41 OB/GYN DISP (PERSONAL CARE ITEMS) ×4 IMPLANT
PATELLA LRG REPLACEMENT (Knees) ×2 IMPLANT
PULSAVAC PLUS IRRIG FAN TIP (DISPOSABLE) ×4
SOL .9 NS 3000ML IRR  AL (IV SOLUTION) ×2
SOL .9 NS 3000ML IRR UROMATIC (IV SOLUTION) ×2 IMPLANT
STAPLER SKIN PROX 35W (STAPLE) ×2 IMPLANT
STOCKINETTE 48X4 2 PLY STRL (GAUZE/BANDAGES/DRESSINGS) ×2 IMPLANT
STOCKINETTE BIAS CUT 6 980064 (GAUZE/BANDAGES/DRESSINGS) ×4 IMPLANT
STOCKINETTE STRL 4IN 9604848 (GAUZE/BANDAGES/DRESSINGS) ×4 IMPLANT
SUCTION FRAZIER HANDLE 10FR (MISCELLANEOUS) ×2
SUCTION TUBE FRAZIER 10FR DISP (MISCELLANEOUS) ×2 IMPLANT
SUT DVC 2 QUILL PDO  T11 36X36 (SUTURE) ×2
SUT DVC 2 QUILL PDO T11 36X36 (SUTURE) IMPLANT
SUT ETHIBOND CT1 BRD #0 30IN (SUTURE) ×4 IMPLANT
SUT ETHILON 4 0 P 3 18 (SUTURE) IMPLANT
SUT ORTHOCORD OS-6 NDL 36 (SUTURE) ×4 IMPLANT
SUT VIC AB 1 CT1 18XCR BRD 8 (SUTURE) IMPLANT
SUT VIC AB 1 CT1 8-18 (SUTURE) ×4
SUT VIC AB 2-0 CT1 18 (SUTURE) ×4 IMPLANT
SUT VIC AB 2-0 CT1 27 (SUTURE) ×2
SUT VIC AB 2-0 CT1 TAPERPNT 27 (SUTURE) ×2 IMPLANT
SYSTEM AUTOTRANSFUS DUAL TROCR (MISCELLANEOUS) ×2 IMPLANT
TIP FAN IRRIG PULSAVAC PLUS (DISPOSABLE) ×2 IMPLANT
TOWER CARTRIDGE SMART MIX (DISPOSABLE) ×4 IMPLANT
TRAY FOLEY MTR SLVR 16FR STAT (SET/KITS/TRAYS/PACK) ×4 IMPLANT

## 2018-01-02 NOTE — Anesthesia Procedure Notes (Signed)
Procedure Name: Intubation Performed by: Lillymae Duet, CRNA Pre-anesthesia Checklist: Patient identified, Patient being monitored, Timeout performed, Emergency Drugs available and Suction available Patient Re-evaluated:Patient Re-evaluated prior to induction Oxygen Delivery Method: Circle system utilized Preoxygenation: Pre-oxygenation with 100% oxygen Induction Type: IV induction Ventilation: Mask ventilation without difficulty Laryngoscope Size: Mac and 3 Grade View: Grade I Tube type: Oral Tube size: 7.5 mm Number of attempts: 1 Airway Equipment and Method: Stylet Placement Confirmation: ETT inserted through vocal cords under direct vision,  positive ETCO2 and breath sounds checked- equal and bilateral Secured at: 22 cm Tube secured with: Tape Dental Injury: Teeth and Oropharynx as per pre-operative assessment        

## 2018-01-02 NOTE — Anesthesia Postprocedure Evaluation (Signed)
Anesthesia Post Note  Patient: Victor DrapeJames C [name]  Procedure(s) Performed: INCISION AND DRAINAGE LEFT KNEE (Left ) poly exchange of tibia and patella left knee (Left Knee)  Patient location during evaluation: PACU Anesthesia Type: General Level of consciousness: awake and alert and oriented Pain management: pain level controlled Vital Signs Assessment: post-procedure vital signs reviewed and stable Respiratory status: spontaneous breathing Cardiovascular status: blood pressure returned to baseline Anesthetic complications: no     Last Vitals:  Vitals:   01/02/18 1224 01/02/18 1331  BP: 128/83 120/87  Pulse: (!) 106 100  Resp: 18 18  Temp: 36.4 C 36.6 C  SpO2: 98% 99%    Last Pain:  Vitals:   01/02/18 1331  TempSrc: Oral  PainSc:                  Magenta Schmiesing

## 2018-01-02 NOTE — Transfer of Care (Signed)
Immediate Anesthesia Transfer of Care Note  Patient: [name]  Procedure(s) Performed: INCISION AND DRAINAGE LEFT KNEE (Left ) poly exchange of tibia and patella left knee (Left Knee)  Patient Location: PACU  Anesthesia Type:General  Level of Consciousness: awake and responds to stimulation  Airway & Oxygen Therapy: Patient Spontanous Breathing and Patient connected to face mask oxygen  Post-op Assessment: Report given to RN and Post -op Vital signs reviewed and stable  Post vital signs: Reviewed and stable  Last Vitals:  Vitals Value Taken Time  BP 129/71 01/02/2018 10:13 AM  Temp 36.1 C 01/02/2018 10:11 AM  Pulse 121 01/02/2018 10:14 AM  Resp 12 01/02/2018 10:14 AM  SpO2 99 % 01/02/2018 10:14 AM  Vitals shown include unvalidated device data.  Last Pain:  Vitals:   01/02/18 0740  TempSrc: Temporal  PainSc: 8       Patients Stated Pain Goal: 3 (01/02/18 0740)  Complications: No apparent anesthesia complications

## 2018-01-02 NOTE — Anesthesia Preprocedure Evaluation (Signed)
Anesthesia Evaluation  Patient identified by MRN, date of birth, ID band Patient awake    Reviewed: Allergy & Precautions, NPO status , Patient's Chart, lab work & pertinent test results  History of Anesthesia Complications Negative for: history of anesthetic complications  Airway Mallampati: II       Dental  (+) Partial Upper, Partial Lower   Pulmonary neg sleep apnea, neg COPD, former smoker,           Cardiovascular hypertension, Pt. on medications + angina + CAD  (-) Past MI and (-) CHF (-) dysrhythmias (-) Valvular Problems/Murmurs     Neuro/Psych neg Seizures Anxiety Depression Bipolar Disorder    GI/Hepatic GERD  ,(+) Hepatitis -, C  Endo/Other  neg diabetes  Renal/GU Renal disease (stones)     Musculoskeletal   Abdominal   Peds  Hematology  (+) anemia ,   Anesthesia Other Findings   Reproductive/Obstetrics                             Anesthesia Physical  Anesthesia Plan  ASA: III  Anesthesia Plan: General   Post-op Pain Management:    Induction: Intravenous  PONV Risk Score and Plan:   Airway Management Planned: Oral ETT  Additional Equipment:   Intra-op Plan:   Post-operative Plan: Extubation in OR  Informed Consent: I have reviewed the patients History and Physical, chart, labs and discussed the procedure including the risks, benefits and alternatives for the proposed anesthesia with the patient or authorized representative who has indicated his/her understanding and acceptance.     Plan Discussed with: CRNA and Surgeon  Anesthesia Plan Comments:         Anesthesia Quick Evaluation

## 2018-01-02 NOTE — Op Note (Signed)
DATE OF SURGERY:  01/02/2018 TIME: 10:11 AM  PATIENT NAME:  Victor DrapeJames C Leavy   AGE: 57 y.o.    PRE-OPERATIVE DIAGNOSIS:  Sub-Acute INFECTED LEFT TOTAL KNEE  POST-OPERATIVE DIAGNOSIS:  Same  PROCEDURE:  Procedure(s): INCISION AND DRAINAGE LEFT KNEE poly exchange of tibia and patella left knee  SURGEON:  Lyndle HerrlichJames R Lynnley Doddridge, MD   CO-SURGEON: Deeann SaintHoward Miller, MD  OPERATIVE IMPLANTS: LCS RP Tibial insert 10 mm LG, 3 Peg Patella Poly replacement, LRG  SPECIMEN: Culture swab and deep tissue was sent  PREOPERATIVE INDICATIONS:  Victor DrapeJames C Lie is an 57 y.o. male who has a diagnosis of INFECTED LEFT TOTAL KNEE and elected for a revision total knee arthroplasty.  The risks, benefits, and alternatives were discussed at length including but not limited to the risks of infection, bleeding, nerve or blood vessel injury, knee stiffness, fracture, dislocation, loosening or failure of the hardware and the need for further surgery. Medical risks include but not limited to DVT and pulmonary embolism, myocardial infarction, stroke, pneumonia, respiratory failure and death. I discussed these risks with the patient in my office prior to the date of surgery. They understood these risks and were willing to proceed.  OPERATIVE FINDINGS AND UNIQUE ASPECTS OF THE CASE:  All three compartments with extensive synovitis and thickening of the capsule. Small amount of purulent material was noted in the medial and lateral gutter. Also, a small pocket of fluid was noted along the medial and posterior tibia. The components were well fixed. A decision was made to proceed with irrigation, debridement and polyethylene exchange.   OPERATIVE DESCRIPTION:  The patient was brought to the operative room and placed in a supine position after undergoing placement of a general anesthetic. IV antibiotics were held until cultures were obtained. Patient received tranexamic acid. The lower extremity was prepped and draped in the usual sterile  fashion.  A time out was performed to verify the patient's name, date of birth, medical record number, correct site of surgery and correct procedure to be performed. The timeout was also used to confirm the patient received antibiotics and that appropriate instruments, implants and radiographs studies were available in the room.  The leg was elevated and the tourniquet was inflated to 250 mmHg.  A midline incision was made over the left knee. The prior incision was excised.. A medial parapatellar arthrotomy was then made and the patella subluxed laterally. The medial lateral gutters were debrided of thickened synovial tissue and capsule. This was passed from the field. The tibia was subluxed anteriorly and the poly component removed. The patellar button was also removed. The knee was thoroughly debrided and irrigated with over 3 liters of neomycin laced normal saline. The knee was re-draped and the new poly components inserted. The knee was stable to varus and valgus stress and there was excellent patellar tracking.  The medial arthrotomy was closed with #1 Vicryl. The subcutaneous tissue closed with  2-0 vicryl, and skin approximated with staples.  A dry sterile and compressive dressing was applied.  A Polar Care was applied to the operative knee.  The patient was awakened and brought to the PACU in stable and satisfactory condition.  All sharp, lap and instrument counts were correct at the conclusion the case.  Total tourniquet time was 33 minutes.

## 2018-01-02 NOTE — Anesthesia Post-op Follow-up Note (Signed)
Anesthesia QCDR form completed.        

## 2018-01-02 NOTE — Progress Notes (Signed)
15 minute call to floor. 

## 2018-01-02 NOTE — H&P (Signed)
THE PATIENT WAS SEEN PRIOR TO SURGERY TODAY.  HISTORY, ALLERGIES, HOME MEDICATIONS AND OPERATIVE PROCEDURE WERE REVIEWED. RISKS AND BENEFITS OF SURGERY DISCUSSED WITH PATIENT AGAIN.  NO CHANGES FROM INITIAL HISTORY AND PHYSICAL NOTED.    

## 2018-01-02 NOTE — Progress Notes (Signed)
[place] at [place] NAME: [name]    MR#:  [id number]  DATE OF BIRTH:  [birth year]  SUBJECTIVE:  patient came in with pain in the left knee with swelling and redness. He had fever documented in the ED. Patient was given Keflex a few days ago from the emergency room for cellulitis left knee.  Complains of pain right hip and left knee.No fever this morning though.surgery planned today  REVIEW OF SYSTEMS:   Review of Systems  Constitutional: Positive for fever and malaise/fatigue. Negative for chills and weight loss.  HENT: Negative for ear discharge, ear pain and nosebleeds.   Eyes: Negative for blurred vision, pain and discharge.  Respiratory: Negative for sputum production, shortness of breath, wheezing and stridor.   Cardiovascular: Negative for chest pain, palpitations, orthopnea and PND.  Gastrointestinal: Negative for abdominal pain, diarrhea, nausea and vomiting.  Genitourinary: Negative for frequency and urgency.  Musculoskeletal: Positive for joint pain. Negative for back pain.  Neurological: Positive for weakness. Negative for sensory change, speech change and focal weakness.  Psychiatric/Behavioral: Negative for depression and hallucinations. The patient is not nervous/anxious.    Tolerating Diet: NPO Tolerating PT: pending  DRUG ALLERGIES:   Allergies  Allergen Reactions  . Toradol [Ketorolac Tromethamine] Hives    VITALS:  Blood pressure 110/75, pulse 99, temperature 98.4 F (36.9 C), temperature source Oral, resp. rate 19, height 6' (1.829 m), weight 81.3 kg, SpO2 97 %.  PHYSICAL EXAMINATION:   Physical Exam  GENERAL:  57 y.o.-year-old patient lying in the bed with no acute distress.  EYES: Pupils equal, round, reactive to light and accommodation. No scleral icterus. Extraocular muscles intact.  HEENT: Head atraumatic, normocephalic. Oropharynx and nasopharynx clear.  NECK:  Supple, no jugular venous  distention. No thyroid enlargement, no tenderness.  LUNGS: Normal breath sounds bilaterally, no wheezing, rales, rhonchi. No use of accessory muscles of respiration.  CARDIOVASCULAR: S1, S2 normal. No murmurs, rubs, or gallops.  ABDOMEN: Soft, nontender, nondistended. Bowel sounds present. No organomegaly or mass.  EXTREMITIES: No cyanosis, clubbing or edema b/l.  Left knee surgical scar, swollen tender with erythema. Right hip cellulitis patch  marked with skin marker NEUROLOGIC: Cranial nerves II through XII are intact. No focal Motor or sensory deficits b/l.   PSYCHIATRIC:  patient is alert and oriented x 3.  SKIN: No obvious rash, lesion, or ulcer.   LABORATORY PANEL:  CBC Recent Labs  Lab 12/30/17 2251  WBC 6.7  HGB 10.7*  HCT 32.9*  PLT 236    Chemistries  Recent Labs  Lab 12/30/17 2251 01/02/18 0439  NA 133*  --   K 3.6  --   CL 105  --   CO2 17*  --   GLUCOSE 142*  --   BUN 13  --   CREATININE 0.93 0.79  CALCIUM 9.1  --   MG 1.8  --   AST 23  --   ALT 15  --   ALKPHOS 82  --   BILITOT 0.6  --    Cardiac Enzymes No results for input(s): TROPONINI in the last 168 hours. RADIOLOGY:  Ct Knee Left W Contrast  Result Date: 12/31/2017 CLINICAL DATA:  Medial knee swelling, possible mass. Left knee pain. EXAM: CT OF THE left KNEE WITH CONTRAST TECHNIQUE: Multidetector CT imaging was performed following the standard protocol during bolus administration of intravenous contrast. COMPARISON:  Radiographs 12/30/2017 FINDINGS: Bones/Joint/Cartilage Total knee prosthesis. This introduces streak artifact which  obscures surrounding findings. No periprosthetic fracture is identified.  No acute bony findings. Knee effusion with suspected synovitis. Synovitis and joint fluid could be causing palpable abnormalities lateral to the distal femoral metaphysis (for example in the vicinity of image 36/8). A fluid collection medial to the proximal tibial rim on image 55/5 could be from MCL  bursitis. Ligaments Suboptimally assessed by CT. Muscles and Tendons Unremarkable Soft tissues Mild subcutaneous edema lateral to the distal femur and in the proximal calf. IMPRESSION: 1. My impression is that there is potentially complex fluid in the suprapatellar bursa with associated synovitis. 2. Fluid density collection medial to the proximal tibial tray and metaphysis for example on image 55/15. Cause is not entirely certain although this certainly could be from MCL bursitis or localized fluid collection along the medial gutter. 3. No periprosthetic fracture or bony complication is identified. Electronically Signed   By: Van Clines M.D.   On: 12/31/2017 16:10   ASSESSMENT AND PLAN:  [name] is a 57 y.o. male who complains of left knee pain and swelling x3 weeks.  He had a left total knee arthroplasty on 10/30/2017 by Dr. Sabra Heck.  Mr. Nunn was admitted overnight after presenting to the ER complaining of left knee pain along with fever and chills  1. Sepsis-gram-positive cocci source appears to be left knee joint which is status post left TKA 1021 2019 -IV cefazolin + Vanco per ID -left knee fluid positive for [facility] -orthopedic consultation appreciated by Dr. Sabra Heck --I and D of left knee with possible removal of pro -Dr. Sabra Heck will discuss with Dr. Harlow Mares to see if patient needs removal of left knee component and placement ofspacer -Infectious disease consultation-- with Dr.Ravi Shankar  -IV fluids -inflammatory markers ESR and CRP elevated  2. Hypotension suspected due to number one -hold enalapril -IV fluids  3. Bipolar disorder/history of PTSD -continue Seroquel  4. DVT prophylaxis subcu Lovenox after sx  Case discussed with Care Management/Social Worker. Management plans discussed with the patient, family and they are in agreement.  CODE STATUS: full  DVT Prophylaxis: Lovenox to be reumed after surgery  TOTAL TIME TAKING CARE OF THIS PATIENT: 30 minutes.  >50%  time spent on counselling and coordination of care  POSSIBLE D/C IN *few* DAYS, DEPENDING ON CLINICAL CONDITION.  Note: This dictation was prepared with Dragon dictation along with smaller phrase technology. Any transcriptional errors that result from this process are unintentional.  Fritzi Mandes M.D on 01/02/2018 at 7:02 AM  Between 7am to 6pm - Pager - [phone]  After 6pm go to [email or web address] - password [email or web address] [place] [facility]  Office  [phone]  CC: Primary care physician; [facility]: NEWEL OIEN, male   DOB: 11/17/1960, 57 y.o.   MRN: [id number]

## 2018-01-03 LAB — CULTURE, BLOOD (ROUTINE X 2)

## 2018-01-03 MED ORDER — ENALAPRIL MALEATE 10 MG PO TABS
10.0000 mg | ORAL_TABLET | Freq: Every day | ORAL | Status: DC
  Administered 2018-01-03: 10 mg via ORAL
  Filled 2018-01-03 (×2): qty 1

## 2018-01-03 MED ORDER — ENOXAPARIN SODIUM 40 MG/0.4ML ~~LOC~~ SOLN
40.0000 mg | SUBCUTANEOUS | Status: DC
  Administered 2018-01-03 – 2018-01-04 (×2): 40 mg via SUBCUTANEOUS
  Filled 2018-01-03 (×3): qty 0.4

## 2018-01-03 MED ORDER — ASPIRIN 81 MG PO CHEW
81.0000 mg | CHEWABLE_TABLET | Freq: Every day | ORAL | Status: DC
  Administered 2018-01-03 – 2018-01-04 (×2): 81 mg via ORAL
  Filled 2018-01-03 (×2): qty 1

## 2018-01-03 NOTE — Progress Notes (Signed)
[place] at [place] NAME: [name]    MR#:  [id number]  DATE OF BIRTH:  1960/12/29  SUBJECTIVE:  patient came in with pain in the left knee with swelling and redness. He had fever documented in the ED. Patient was given Keflex a few days ago from the emergency room for cellulitis left knee.  Complains of pain  left knee. POD #1  REVIEW OF SYSTEMS:   Review of Systems  Constitutional: Positive for malaise/fatigue. Negative for chills and weight loss.  HENT: Negative for ear discharge, ear pain and nosebleeds.   Eyes: Negative for blurred vision, pain and discharge.  Respiratory: Negative for sputum production, shortness of breath, wheezing and stridor.   Cardiovascular: Negative for chest pain, palpitations, orthopnea and PND.  Gastrointestinal: Negative for abdominal pain, diarrhea, nausea and vomiting.  Genitourinary: Negative for frequency and urgency.  Musculoskeletal: Positive for joint pain. Negative for back pain.  Neurological: Positive for weakness. Negative for sensory change, speech change and focal weakness.  Psychiatric/Behavioral: Negative for depression and hallucinations. The patient is not nervous/anxious.    Tolerating Diet: soft Tolerating PT: pending  DRUG ALLERGIES:   Allergies  Allergen Reactions  . Toradol [Ketorolac Tromethamine] Hives    VITALS:  Blood pressure 138/89, pulse 98, temperature (!) 97.5 F (36.4 C), resp. rate 16, height 6' (1.829 m), weight 81.6 kg, SpO2 98 %.  PHYSICAL EXAMINATION:   Physical Exam  GENERAL:  57 y.o.-year-old patient lying in the bed with no acute distress.  EYES: Pupils equal, round, reactive to light and accommodation. No scleral icterus. Extraocular muscles intact.  HEENT: Head atraumatic, normocephalic. Oropharynx and nasopharynx clear.  NECK:  Supple, no jugular venous distention. No thyroid enlargement, no tenderness.  LUNGS: Normal breath sounds  bilaterally, no wheezing, rales, rhonchi. No use of accessory muscles of respiration.  CARDIOVASCULAR: S1, S2 normal. No murmurs, rubs, or gallops.  ABDOMEN: Soft, nontender, nondistended. Bowel sounds present. No organomegaly or mass.  EXTREMITIES: No cyanosis, clubbing or edema b/l.  Left knee polar care and surgical dressing+ NEUROLOGIC: Cranial nerves II through XII are intact. No focal Motor or sensory deficits b/l.   PSYCHIATRIC:  patient is alert and oriented x 3.  SKIN: No obvious rash, lesion, or ulcer.   LABORATORY PANEL:  CBC Recent Labs  Lab 12/30/17 2251  WBC 6.7  HGB 10.7*  HCT 32.9*  PLT 236    Chemistries  Recent Labs  Lab 12/30/17 2251 01/02/18 0439  NA 133*  --   K 3.6  --   CL 105  --   CO2 17*  --   GLUCOSE 142*  --   BUN 13  --   CREATININE 0.93 0.79  CALCIUM 9.1  --   MG 1.8  --   AST 23  --   ALT 15  --   ALKPHOS 82  --   BILITOT 0.6  --    Cardiac Enzymes No results for input(s): TROPONINI in the last 168 hours. RADIOLOGY:  No results found. ASSESSMENT AND PLAN:  STOY FENN is a 57 y.o. male who complains of left knee pain and swelling x3 weeks.  He had a left total knee arthroplasty on 10/30/2017 by Dr. Sabra Heck.  Mr. Steinhart was admitted overnight after presenting to the ER complaining of left knee pain along with fever and chills  1. Sepsis-gram- Staph Hominis source appears to be left knee joint which is status post left TKA 10/30/2017 -  IV cefazolin + Vanco per ID -left knee fluid positive for staph aureus -orthopedic consultation appreciated by Dr. Sabra Heck --I and D of left knee with  - removal of left knee component and placement of spacer --POD#1 -Infectious disease consultation-- with Dr.Ravi Shankar  -inflammatory markers ESR and CRP elevated  2. Hypotension suspected due to number one -resume enalapril -received IV fluids -BP stable  3. Bipolar disorder/history of PTSD -continue Seroquel  4. DVT prophylaxis subcu Lovenox    PT to see in am (they are off today)  Case discussed with Care Management/Social Worker. Management plans discussed with the patient, family and they are in agreement.  CODE STATUS: full  DVT Prophylaxis: Lovenox t TOTAL TIME TAKING CARE OF THIS PATIENT: 30 minutes.  >50% time spent on counselling and coordination of care  POSSIBLE D/C IN *few* DAYS, DEPENDING ON CLINICAL CONDITION.  Note: This dictation was prepared with Dragon dictation along with smaller phrase technology. Any transcriptional errors that result from this process are unintentional.  Fritzi Mandes M.D on 01/03/2018 at 7:29 AM  Between 7am to 6pm - Pager - [phone]  After 6pm go to [email or web address] - password [email or web address] [place] [facility]  Office  [phone]  CC: Primary care physician; [facility]: JAKIM DRAPEAU, male   DOB: 09-07-1960, 57 y.o.   MRN: [id number]

## 2018-01-03 NOTE — [place] FL2 (Signed)
[place] MEDICAID FL2 LEVEL OF CARE SCREENING TOOL     IDENTIFICATION  Patient Name: [name] Birthdate: 09/03/1960 Sex: male Admission Date (Current Location): 12/30/2017  [place] [facility] [place]  [place] ([id number]) Facility and Address:  [facility], [address], [place], [place] [address]      Provider Number: [id number]  Attending Physician Name and Address:  Enedina FinnerPatel, Sona, MD  Relative Name and Phone Number:       Current Level of Care: Hospital Recommended Level of Care: Skilled Nursing Facility Prior Approval Number:    Date Approved/Denied:   PASRR Number: [id number])  Discharge Plan: SNF    Current Diagnoses: Patient Active Problem List   Diagnosis Date Noted  . Sepsis (HCC) 12/31/2017  . History of esophagogastroduodenoscopy (EGD) 01/12/2016  . Esophagitis 01/12/2016  . Hypokalemia 01/12/2016  . Leukocytosis 01/12/2016  . Nausea and vomiting 01/11/2016  . Odynophagia 01/11/2016  . Abdominal pain 01/06/2016  . Anemia 06/02/2015  . Chest pain with moderate risk for cardiac etiology   . Coronary artery disease involving native coronary artery of native heart with angina pectoris (HCC)   . Hyperlipidemia   . Pleuritic chest pain 06/01/2015  . Pericarditis with effusion 05/13/2015  . Chest pain at rest 05/12/2015  . Dyspnea 05/12/2015  . SIRS (systemic inflammatory response syndrome) (HCC) 05/12/2015  . Total knee replacement status 04/22/2015    Orientation RESPIRATION BLADDER Height & Weight     Self, Time, Situation, Place  Normal Continent Weight: 180 lb (81.6 kg) Height:  6' (182.9 cm)  BEHAVIORAL SYMPTOMS/MOOD NEUROLOGICAL BOWEL NUTRITION STATUS      Continent Diet(Diet: Regular )  AMBULATORY STATUS COMMUNICATION OF NEEDS Skin   Extensive Assist Verbally Surgical wounds(Incision: Left Knee. )                       Personal Care Assistance Level of Assistance  Bathing, Feeding, Dressing  Bathing Assistance: Limited assistance Feeding assistance: Independent Dressing Assistance: Limited assistance     Functional Limitations Info  Sight, Hearing, Speech Sight Info: Adequate Hearing Info: Adequate Speech Info: Adequate    SPECIAL CARE FACTORS FREQUENCY  PT (By licensed PT), OT (By licensed OT)     PT Frequency: (5) OT Frequency: (5)            Contractures      Additional Factors Info  Code Status, Allergies Code Status Info: (Full Code. ) Allergies Info: (Toradol Ketorolac Tromethamine)           Current Medications (01/03/2018):  This is the current hospital active medication list Current Facility-Administered Medications  Medication Dose Route Frequency Provider Last Rate Last Dose  . acetaminophen (TYLENOL) tablet 650 mg  650 mg Oral Q6H PRN Lyndle HerrlichBowers, Americo R, MD       Or  . acetaminophen (TYLENOL) suppository 650 mg  650 mg Rectal Q6H PRN Lyndle HerrlichBowers, Lancelot R, MD      . aspirin chewable tablet 81 mg  81 mg Oral Daily Enedina FinnerPatel, Sona, MD   81 mg at 01/03/18 0920  . bisacodyl (DULCOLAX) EC tablet 5 mg  5 mg Oral Daily PRN Lyndle HerrlichBowers, Iverson R, MD      . ceFAZolin (ANCEF) IVPB 2g/100 mL premix  2 g Intravenous Q8H Lyndle HerrlichBowers, Welles R, MD 200 mL/hr at 01/03/18 0727 2 g at 01/03/18 0727  . chlorhexidine (HIBICLENS) 4 % liquid 1 application  1 application Topical Once Lyndle HerrlichBowers, Evelio R, MD      .  Chlorhexidine Gluconate Cloth 2 % PADS 6 each  6 each Topical Q0600 Lyndle HerrlichBowers, Brodyn R, MD   6 each at 01/02/18 0601  . divalproex (DEPAKOTE) DR tablet 500 mg  500 mg Oral BID Lyndle HerrlichBowers, Sevin R, MD   500 mg at 01/03/18 [id number]  . docusate sodium (COLACE) capsule 100 mg  100 mg Oral BID Lyndle HerrlichBowers, Thaison R, MD   100 mg at 01/03/18 0920  . enalapril (VASOTEC) tablet 10 mg  10 mg Oral Daily Enedina FinnerPatel, Sona, MD   10 mg at 01/03/18 [id number]  . enoxaparin (LOVENOX) injection 40 mg  40 mg Subcutaneous Q24H Enedina FinnerPatel, Sona, MD   40 mg at 01/03/18 0920  . feeding supplement (ENSURE ENLIVE) (ENSURE ENLIVE) liquid 237  mL  237 mL Oral BID BM Lyndle HerrlichBowers, Lukus R, MD   237 mL at 01/03/18 0928  . FLUoxetine (PROZAC) capsule 80 mg  80 mg Oral Daily Lyndle HerrlichBowers, Abdulrahman R, MD   80 mg at 01/03/18 [id number]  . gabapentin (NEURONTIN) capsule 400 mg  400 mg Oral BID Lyndle HerrlichBowers, Tashaun R, MD   400 mg at 01/03/18 0920  . HYDROcodone-acetaminophen (NORCO/VICODIN) 5-325 MG per tablet 1-2 tablet  1-2 tablet Oral Q6H PRN Lyndle HerrlichBowers, Jenkins R, MD   2 tablet at 01/03/18 0920  . menthol-cetylpyridinium (CEPACOL) lozenge 3 mg  1 lozenge Oral PRN Lyndle HerrlichBowers, Johnathon R, MD       Or  . phenol (CHLORASEPTIC) mouth spray 1 spray  1 spray Mouth/Throat PRN Lyndle HerrlichBowers, Chevy R, MD      . metoCLOPramide (REGLAN) tablet 5-10 mg  5-10 mg Oral Q8H PRN Lyndle HerrlichBowers, Ulrick R, MD       Or  . metoCLOPramide (REGLAN) injection 5-10 mg  5-10 mg Intravenous Q8H PRN Lyndle HerrlichBowers, Dathan R, MD      . morphine 2 MG/ML injection 1-2 mg  1-2 mg Intravenous Q4H PRN Lyndle HerrlichBowers, Jayden R, MD   2 mg at 01/03/18 0433  . mupirocin ointment (BACTROBAN) 2 % 1 application  1 application Nasal BID Lyndle HerrlichBowers, Mikell R, MD   1 application at 01/03/18 [phone]  . ondansetron (ZOFRAN) tablet 4 mg  4 mg Oral Q6H PRN Lyndle HerrlichBowers, Seann R, MD       Or  . ondansetron [facility](ZOFRAN) injection 4 mg  4 mg Intravenous Q6H PRN Lyndle HerrlichBowers, Lelon R, MD      . ondansetron [facility](ZOFRAN) tablet 4 mg  4 mg Oral Q6H PRN Lyndle HerrlichBowers, Daryan R, MD       Or  . ondansetron [facility](ZOFRAN) injection 4 mg  4 mg Intravenous Q6H PRN Lyndle HerrlichBowers, Malikhi R, MD      . QUEtiapine (SEROQUEL XR) 24 hr tablet 400 mg  400 mg Oral QHS Lyndle HerrlichBowers, Payam R, MD   400 mg at 01/02/18 2207  . QUEtiapine (SEROQUEL) tablet 50 mg  50 mg Oral QID PRN Lyndle HerrlichBowers, Eathen R, MD      . senna-docusate (Senokot-S) tablet 1 tablet  1 tablet Oral QHS PRN Lyndle HerrlichBowers, Elber R, MD         Discharge Medications: Please see discharge summary for a list of discharge medications.  Relevant Imaging Results:  Relevant Lab Results:   Additional Information (SSN: [id number])  Gerre Ranum, Darleen CrockerBailey M, LCSW

## 2018-01-03 NOTE — Clinical Social Work Note (Signed)
Clinical Social Work Assessment  Patient Details  Name: [name] MRN: [id number] Date of Birth: [birth year]  Date of referral:  01/03/18               Reason for consult:  Facility Placement                Permission sought to share information with:    Permission granted to share information::     Name::        Agency::     Relationship::     Contact Information:     Housing/Transportation Living arrangements for the past 2 months:  Single Family Home Source of Information:  Patient Patient Interpreter Needed:  None Criminal Activity/Legal Involvement Pertinent to Current Situation/Hospitalization:  No - Comment as needed Significant Relationships:  Friend Lives with:  Friends Do you feel safe going back to the place where you live?  Yes Need for family participation in patient care:  Yes (Comment)  Care giving concerns:  Patient lives in a house in [place] with her friend Tessie Fass of 72 years.    Social Worker assessment / plan:  Holiday representative (CSW) received SNF consult. PT is pending. CSW met with patient alone at bedside today to discuss D/C plan. Patient was alert and oriented X4 and was laying in the bed. CSW introduced self and explained role of CSW department. Per patient he has medicaid and receives SSI/ disability income around $771 per month. Per patient he does not drive and he relies on his friend/ roommate Patsy for transportation. Per patient him and Tessie Fass have been best friends and lived together for 26 years. CSW explained that PT will evaluate patient and make a recommendation of home health or SNF. CSW explained that in order to go to SNF under medicaid patient would have to sign over his SSI check to the facility, stay at the facility for 30 days and likely go to a facility outside of [facility] because of limited medicaid bed. Patient reported that he would not be willing to sign over his check and prefers to D/C home. CSW will continue to follow and  assist as needed.   Employment status:  Disabled (Comment on whether or not currently receiving Disability) Insurance information:  [place]icaid [place] PT Recommendations:  Not assessed at this time Information / Referral to community resources:  Other (Comment Required)(Patient prefers to D/C home with home health. )  Patient/Family's Response to care:  Patient prefers to D/C home.   Patient/Family's Understanding of and Emotional Response to Diagnosis, Current Treatment, and Prognosis:  Patient was very pleasant and thanked CSW for visit.   Emotional Assessment Appearance:  Appears stated age Attitude/Demeanor/Rapport:    Affect (typically observed):  Accepting, Adaptable, Pleasant Orientation:  Oriented to Self, Oriented to Place, Oriented to  Time, Oriented to Situation Alcohol / Substance use:  Not Applicable Psych involvement (Current and /or in the community):  No (Comment)  Discharge Needs  Concerns to be addressed:  Discharge Planning Concerns Readmission within the last 30 days:  No Current discharge risk:  Dependent with Mobility Barriers to Discharge:  Continued Medical Work up   [employer], Veronia Beets, LCSW 01/03/2018, 9:56 AM

## 2018-01-03 NOTE — Treatment Plan (Signed)
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 Toradol [Ketorolac Tromethamine] Hives    OPAT Orders Discharge antibiotics: Cefazolin 2 grams IV every 8 hours until 02/13/18    [facility] Care Per Protocol:  Labs weekly ( Monday)  while on IV antibiotics: _X_ CBC with differential  _X_ CMP  Labs once every 2 weeks on Monday _X_ CRP _X_ ESR   Please pull PIC at completion of IV antibiotics   Fax weekly labs to [phone]  Clinic Follow Up Appt:   Call [phone] to make appt

## 2018-01-03 NOTE — Progress Notes (Signed)
Date of Admission:  12/30/2017      ID: Victor DrapeJames C Stickels is a 57 y.o. male Active Problems:   Left knee PJI   Hospital course DOA 12/30/17 I/D and poly exchange of tibia and patella on 01/02/18   Subjective: Has left knee pain which is pretty severe No fever  Medications:  . aspirin  81 mg Oral Daily  . chlorhexidine  1 application Topical Once  . Chlorhexidine Gluconate Cloth  6 each Topical Q0600  . divalproex  500 mg Oral BID  . docusate sodium  100 mg Oral BID  . enalapril  10 mg Oral Daily  . enoxaparin (LOVENOX) injection  40 mg Subcutaneous Q24H  . feeding supplement (ENSURE ENLIVE)  237 mL Oral BID BM  . FLUoxetine  80 mg Oral Daily  . gabapentin  400 mg Oral BID  . mupirocin ointment  1 application Nasal BID  . QUEtiapine  400 mg Oral QHS    Objective: Vital signs in last 24 hours: Temp:  [97.5 F (36.4 C)-98.4 F (36.9 C)] 98.2 F (36.8 C) (12/25 1556) Pulse Rate:  [91-99] 91 (12/25 1556) Resp:  [15-16] 16 (12/25 0000) BP: (103-146)/(63-91) 103/63 (12/25 1556) SpO2:  [98 %-99 %] 98 % (12/25 1556)  PHYSICAL EXAM:  General: Alert, cooperative, no distress, appears stated age.  Head: Normocephalic, without obvious abnormality, atraumatic. Eyes: Conjunctivae clear, anicteric sclerae. Pupils are equal ENT Nares normal. No drainage or sinus tenderness. Lips, mucosa, and tongue normal. No Thrush Neck: Supple, symmetrical, no adenopathy, thyroid: non tender no carotid bruit and no JVD. Back: No CVA tenderness. Lungs: Clear to auscultation bilaterally. No Wheezing or Rhonchi. No rales. Heart: Regular rate and rhythm, no murmur, rub or gallop. Abdomen: Soft, non-tender,not distended. Bowel sounds normal. No masses Extremities: left leg surgical dressing Skin: No rashes or lesions. Or bruising Lymph: Cervical, supraclavicular normal. Neurologic: Grossly non-focal  Lab Results Recent Labs    01/02/18 0439  CREATININE 0.79   CBC Latest Ref Rng & Units  12/30/2017 12/04/2017 11/02/2017  WBC 4.0 - 10.5 K/uL 6.7 6.6 5.4  Hemoglobin 13.0 - 17.0 g/dL 10.7(L) 11.5(L) 9.9(L)  Hematocrit 39.0 - 52.0 % 32.9(L) 35.3(L) 30.3(L)  Platelets 150 - 400 K/uL 236 230 206   Liver Panel No results for input(s): PROT, ALBUMIN, AST, ALT, ALKPHOS, BILITOT, BILIDIR, IBILI in the last 72 hours. Sedimentation Rate No results for input(s): ESRSEDRATE in the last 72 hours. C-Reactive Protein No results for input(s): CRP in the last 72 hours.  Microbiology: Wound culture -MSSA Studies/Results: No results found.   Assessment/Plan: 57 y.o.male  with a history of Bipolar disorder b/l TKA ias admitted with painful swelling of left knee. Pt on 10/30/17 underwent left TKA, and when the sutures were removed he noted some discharge and  pain. He came to the ED on 11/25 and was given keflex for superficial infection. He has not had relief and the swelling got worse with increasing pain and redness at the surgical site. He had subjective fever and chills. ?  Left PJI-  Due to staph aureus ,  Continue cefazolin- will need IV for 6 weeks and then PO s/p  wash out and poly-exchange .  Staph hominis  in blood likely skin contaminant, will repeat blood culture( cefazolin will cover that as well) ? 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 Follow up with me in 2-3 weeks as OP- call [phone] to make the appt

## 2018-01-03 NOTE — Progress Notes (Signed)
  Subjective:  POD #1 s/p I& D and poly exchange for left septic knee joint.  Patient up out of bed to a chair.  Patient reports left anterior knee pain as marked.    Objective:   VITALS:   Vitals:   01/02/18 2003 01/03/18 0000 01/03/18 0354 01/03/18 0748  BP: 139/87 (!) 146/91 138/89 122/68  Pulse: 95 96 98 99  Resp: 15 16    Temp: 97.9 F (36.6 C)  (!) 97.5 F (36.4 C) 98.4 F (36.9 C)  TempSrc: Oral   Oral  SpO2: 99% 98% 98% 98%  Weight:      Height:        PHYSICAL EXAM: Left lower extremity: Neurovascular intact Sensation intact distally Intact pulses distally Dorsiflexion/Plantar flexion intact Incision: dressing C/D/I Compartment soft  LABS  No results found for this or any previous visit (from the past 24 hour(s)).  No results found.  Assessment/Plan: 1 Day Post-Op   Active Problems:   Sepsis [facility](HCC)  Patient is complaining of left knee pain.  Otherwise he is stable postop.  Continue current pain management.  Patient will continue to receive physical therapy tomorrow.  Continue Polar Care and elevation to reduce swelling.  Patient will receive Lovenox for DVT prophylaxis.  Patient will need a PICC line for IV antibiotic treatment prior to discharge.    Juanell FairlyKRASINSKI, Sven Pinheiro , MD 01/03/2018, 1:31 PM

## 2018-01-04 ENCOUNTER — Inpatient Hospital Stay: Payer: Self-pay

## 2018-01-04 ENCOUNTER — Encounter: Payer: Self-pay | Admitting: Specialist

## 2018-01-04 LAB — COMPREHENSIVE METABOLIC PANEL
ALT: 7 U/L (ref 0–44)
AST: 10 U/L — ABNORMAL LOW (ref 15–41)
Albumin: 2.4 g/dL — ABNORMAL LOW (ref 3.5–5.0)
Alkaline Phosphatase: 83 U/L (ref 38–126)
Anion gap: 8 (ref 5–15)
BUN: 8 mg/dL (ref 6–20)
CO2: 26 mmol/L (ref 22–32)
Calcium: 8.6 mg/dL — ABNORMAL LOW (ref 8.9–10.3)
Chloride: 102 mmol/L (ref 98–111)
Creatinine, Ser: 0.69 mg/dL (ref 0.61–1.24)
GFR calc Af Amer: >60 mL/min (ref >60)
GFR calc non Af Amer: >60 mL/min (ref >60)
Glucose, Bld: 103 mg/dL — ABNORMAL HIGH (ref 70–99)
Potassium: 3.2 mmol/L — ABNORMAL LOW (ref 3.5–5.1)
Sodium: 136 mmol/L (ref 135–145)
Total Bilirubin: 0.4 mg/dL (ref 0.3–1.2)
Total Protein: 6.3 g/dL — ABNORMAL LOW (ref 6.5–8.1)

## 2018-01-04 LAB — CBC WITH DIFFERENTIAL/PLATELET
Abs Immature Granulocytes: 0.05 10*3/uL (ref 0.00–0.07)
BASOS PCT: 0 %
Basophils Absolute: 0 10*3/uL (ref 0.0–0.1)
EOS ABS: 0.1 10*3/uL (ref 0.0–0.5)
Eosinophils Relative: 1 %
HCT: 25.7 % — ABNORMAL LOW (ref 39.0–52.0)
Hemoglobin: 8.4 g/dL — ABNORMAL LOW (ref 13.0–17.0)
Immature Granulocytes: 1 %
Lymphocytes Relative: 13 %
Lymphs Abs: 1 10*3/uL (ref 0.7–4.0)
MCH: 26.8 pg (ref 26.0–34.0)
MCHC: 32.7 g/dL (ref 30.0–36.0)
MCV: 82.1 fL (ref 80.0–100.0)
Monocytes Absolute: 0.7 10*3/uL (ref 0.1–1.0)
Monocytes Relative: 9 %
Neutro Abs: 5.9 10*3/uL (ref 1.7–7.7)
Neutrophils Relative %: 76 %
PLATELETS: 253 10*3/uL (ref 150–400)
RBC: 3.13 MIL/uL — ABNORMAL LOW (ref 4.22–5.81)
RDW: 16.1 % — ABNORMAL HIGH (ref 11.5–15.5)
WBC: 7.7 10*3/uL (ref 4.0–10.5)
nRBC: 0 % (ref 0.0–0.2)

## 2018-01-04 LAB — C-REACTIVE PROTEIN: CRP: 30.9 mg/dL — ABNORMAL HIGH (ref <1.0)

## 2018-01-04 LAB — SEDIMENTATION RATE: SED RATE: 126 mm/h — AB (ref 0–20)

## 2018-01-04 MED ORDER — HYDROCODONE-ACETAMINOPHEN 5-325 MG PO TABS: 1.0000 | ORAL_TABLET | Freq: Four times a day (QID) | ORAL | 0 refills | Status: DC | PRN

## 2018-01-04 MED ORDER — SODIUM CHLORIDE 0.9% FLUSH: 10.0000 mL | Freq: Two times a day (BID) | INTRAVENOUS | Status: DC

## 2018-01-04 MED ORDER — SODIUM CHLORIDE 0.9% FLUSH: 10.0000 mL | INTRAVENOUS | Status: DC | PRN

## 2018-01-04 MED ORDER — CEFAZOLIN IV (FOR PTA / DISCHARGE USE ONLY): 2.0000 g | Freq: Three times a day (TID) | INTRAVENOUS | 0 refills | Status: AC

## 2018-01-04 MED ORDER — POTASSIUM CHLORIDE CRYS ER 20 MEQ PO TBCR
20.0000 meq | EXTENDED_RELEASE_TABLET | Freq: Once | ORAL | Status: AC
  Administered 2018-01-04: 20 meq via ORAL
  Filled 2018-01-04: qty 1

## 2018-01-04 NOTE — Discharge Summary (Signed)
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 Anxiety   . Arthritis    knees and hands  . Bipolar disorder ([place])   . Depression   . GERD (gastroesophageal reflux disease)   . Hepatitis    HEP "C"  . History of kidney stones   . Hypertension   . Kidney stones   . Pericarditis 05/2015   a. echo 5/17: EF 60-65%, no RWMA, LV dias fxn nl, LA mildly dilated, RV sys fxn nl, PASP nl, moderate sized circumferential pericardial effusion was identified, 2.12 cm around the LV free wall, <1 cm around the RV free wall. Features were not c/w tamponade physiology  . PTSD (post-traumatic stress disorder)    Witnessed brother's suicide.  Victor Kitchen Restless leg syndrome   . Syncope     HOSPITAL COURSE:   Victor Reine Smithis a 57 y.o.malewho complains of left knee pain and swelling x3 weeks. He had a left total knee arthroplasty on 10/30/2017 by Dr. Sabra Heck. [name] was admitted overnight after presenting to the ER complaining of left knee pain along with fever and chills  1. Sepsis- Staph Hominis (skin contamination) source appears to be left knee joint which is status post left TKA 10/30/2017 -IV cefazolin till 02/13/2017 -left knee fluid positive for staph aureus -orthopedic consultation appreciated by Dr. Sabra Heck --I and D of left knee with  removal of left knee component and placement of  spacer --POD# 2 -Infectious disease consultation-- with Melven Sartorius appreciated -inflammatory markers ESR and CRP elevated -repeat bc negative -PICC line today  2. Hypotension suspected due to number one -resume enalapril -received IV fluids -BP stable  3. Bipolar disorder/history of PTSD -continue Seroquel  4. DVT prophylaxis subcu Lovenox   PT to see today and then d/c home thereafter. Pt does not want to go to rehab CONSULTS OBTAINED:  Treatment Team:  Arta Silence, MD Fritzi Mandes, MD [place], MD Tsosie Billing, MD  DRUG ALLERGIES:   Allergies  Allergen Reactions  . Toradol [Ketorolac Tromethamine] Hives    DISCHARGE MEDICATIONS:   Allergies as of 01/04/2018      Reactions   Toradol [ketorolac Tromethamine] Hives      Medication List    STOP taking these medications   benztropine 0.5 MG tablet Commonly known as:  COGENTIN   pantoprazole 40 MG tablet Commonly known as:  PROTONIX   rosuvastatin 10 MG tablet Commonly known as:  CRESTOR   sucralfate 1 g tablet Commonly known as:  CARAFATE     TAKE these medications   aspirin EC 325 MG tablet Take 1 tablet (325 mg total) by mouth daily.   ceFAZolin  IVPB Commonly known as:  ANCEF Inject 2 g into the vein every 8 (eight) hours. Indication:  MSSA prosthetic joint infection Last Day of Therapy:  02/13/2017 Labs - Once weekly (Monday):  CBC/D and CMP, Labs - Every other week (  Monday):  ESR and CRP  Pull PICC at end of therapy   divalproex 500 MG DR tablet Commonly known as:  DEPAKOTE Take 500 mg by mouth 2 (two) times daily.   enalapril 10 MG tablet Commonly known as:  VASOTEC Take 10 mg by mouth daily.   FLUoxetine 20 MG capsule Commonly known as:  PROZAC Take 80 mg by mouth daily.   gabapentin 400 MG capsule Commonly known as:  NEURONTIN Take 1 capsule (400 mg total) by mouth 2 (two) times daily.   HYDROcodone-acetaminophen 5-325 MG tablet Commonly known as:   NORCO/VICODIN Take 1-2 tablets by mouth every 6 (six) hours as needed for moderate pain. What changed:    how much to take  when to take this   meloxicam 15 MG tablet Commonly known as:  MOBIC Take 1 tablet (15 mg total) by mouth daily.   REXULTI 2 MG Tabs Generic drug:  Brexpiprazole TK 1 T PO HS   SEROQUEL XR 400 MG 24 hr tablet Generic drug:  QUEtiapine Take 400 mg by mouth at bedtime.   QUEtiapine 50 MG tablet Commonly known as:  SEROQUEL Take 50 mg by mouth 4 (four) times daily as needed (for agitation).   zolpidem 10 MG tablet Commonly known as:  AMBIEN Take 10 mg by mouth at bedtime as needed for sleep.            Home Infusion Instuctions  (From admission, onward)         Start     Ordered   01/04/18 0000  Home infusion instructions [facility] May follow [facility] Dosing Protocol; May administer Cathflo as needed to maintain patency of vascular access device.; Flushing of vascular access device: per [facility] Protocol: 0.9% NaCl pre/post medica...    Question Answer Comment  Instructions May follow [facility] Dosing Protocol   Instructions May administer Cathflo as needed to maintain patency of vascular access device.   Instructions Flushing of vascular access device: per [facility] Protocol: 0.9% NaCl pre/post medication administration and prn patency; Heparin 100 u/ml, 29m for implanted ports and Heparin 10u/ml, 596mfor all other central venous catheters.   Instructions May follow [facility] Anaphylaxis Protocol for First Dose Administration in the home: 0.9% NaCl at 25-50 ml/hr to maintain IV access for protocol meds. Epinephrine 0.3 ml IV/IM PRN and Benadryl 25-50 IV/IM PRN s/s of anaphylaxis.   Instructions [facility] Infusion Coordinator (RN) to assist per patient IV care needs in the home PRN.      01/04/18 0748           Durable Medical Equipment  (From admission, onward)         Start     Ordered   01/04/18 0748  For home use only DME  [facility]ford Rile[facility]    Question:  Patient needs a walker to treat with the following condition  Answer:  Infection of left knee ([facility]  01/04/18 0749          If you experience worsening of your admission symptoms, develop shortness of breath, life threatening emergency, suicidal or homicidal thoughts you must seek medical attention immediately by calling 911 or calling your MD immediately  if symptoms less severe.  You Must read complete instructions/literature along with all the possible adverse reactions/side effects for all the Medicines you take and that have been prescribed to you. Take any new Medicines after you have completely understood and accept all the possible adverse reactions/side  effects.   Please note  You were cared for by a hospitalist during your hospital stay. If you have any questions about your discharge medications or the care you received while you were in the hospital after you are discharged, you can call the unit and asked to speak with the hospitalist on call if the hospitalist that took care of you is not available. Once you are discharged, your primary care physician will handle any further medical issues. Please note that NO REFILLS for any discharge medications will be authorized once you are discharged, as it is imperative that you return to your primary care physician (or establish a relationship with a primary care physician if you do not have one) for your aftercare needs so that they can reassess your need for medications and monitor your lab values. Today   SUBJECTIVE   Left knee pain. Did get out to chair yday  VITAL SIGNS:  Blood pressure 92/63, pulse 89, temperature 98.1 F (36.7 C), temperature source Oral, resp. rate 18, height 6' (1.829 m), weight 81.6 kg, SpO2 96 %.  I/O:    Intake/Output Summary (Last 24 hours) at 01/04/2018 0750 Last data filed at 01/04/2018 0555 Gross per 24 hour  Intake -  Output 1930 ml  Net -1930 ml     PHYSICAL EXAMINATION:  GENERAL:  57 y.o.-year-old patient lying in the bed with no acute distress.  EYES: Pupils equal, round, reactive to light and accommodation. No scleral icterus. Extraocular muscles intact.  HEENT: Head atraumatic, normocephalic. Oropharynx and nasopharynx clear.  NECK:  Supple, no jugular venous distention. No thyroid enlargement, no tenderness.  LUNGS: Normal breath sounds bilaterally, no wheezing, rales,rhonchi or crepitation. No use of accessory muscles of respiration.  CARDIOVASCULAR: S1, S2 normal. No murmurs, rubs, or gallops.  ABDOMEN: Soft, non-tender, non-distended. Bowel sounds present. No organomegaly or mass.  EXTREMITIES: No pedal edema, cyanosis, or clubbing. Left knee dressing+ polar care NEUROLOGIC: Cranial nerves II through XII are intact. Muscle strength 5/5 in all extremities. Sensation intact. Gait not checked.  PSYCHIATRIC: The patient is alert and oriented x 3.  SKIN: No obvious rash, lesion, or ulcer.   DATA REVIEW:   CBC  Recent Labs  Lab 01/04/18 0338  WBC 7.7  HGB 8.4*  HCT 25.7*  PLT 253    Chemistries  Recent Labs  Lab 12/30/17 2251  01/04/18 0338  NA 133*  --  136  K 3.6  --  3.2*  CL 105  --  102  CO2 17*  --  26  GLUCOSE 142*  --  103*  BUN 13  --  8  CREATININE 0.93   < > 0.69  CALCIUM 9.1  --  8.6*  MG 1.8  --   --   AST 23  --  10*  ALT 15  --  7  ALKPHOS 82  --  83  BILITOT 0.6  --  0.4   < > = values in this interval not displayed.    Microbiology Results   Recent Results (from the past 240 hour(s))  Blood culture (routine x 2)     Status: Abnormal   Collection Time: 12/30/17 10:51 PM  Result Value Ref Range Status   Specimen Description BLOOD RIGHT FOREARM  Final   Special Requests   Final    BOTTLES DRAWN AEROBIC AND ANAEROBIC Blood Culture results may not be optimal due to an excessive volume of blood received in culture bottles Performed at [facility], [address]  [place], [place] [address]    Culture  Setup Time   Final    AEROBIC BOTTLE ONLY GRAM POSITIVE COCCI CRITICAL RESULT CALLED TO, READ BACK BY AND VERIFIED WITH: CHRIS NESBITT ON 12/31/17 AT 2030 [place] GRAM STAIN REVIEWED-AGREE WITH RESULT    Culture (A)  Final    STAPHYLOCOCCUS HOMINIS SUSCEPTIBILITIES PERFORMED ON PREVIOUS CULTURE WITHIN THE LAST 5 DAYS. Performed at [facility], [place] [address] [place], [place] [address]    Report Status 01/03/2018 FINAL  Final  Blood Culture ID Panel (Reflexed)     Status: Abnormal   Collection Time: 12/30/17 10:51 PM  Result Value Ref Range Status   Enterococcus species NOT DETECTED NOT DETECTED Final   Listeria monocytogenes NOT DETECTED NOT DETECTED Final   Staphylococcus species DETECTED (A) NOT DETECTED Final    Comment: Methicillin (oxacillin) susceptible coagulase negative staphylococcus. Possible blood culture contaminant (unless isolated from more than one blood culture draw or clinical case suggests pathogenicity). No antibiotic treatment is indicated for blood  culture contaminants. CRITICAL RESULT CALLED TO, READ BACK BY AND VERIFIED WITH: CHRIS NESBITT ON 12/31/17 AT 2030 [place]    Staphylococcus aureus (BCID) NOT DETECTED NOT DETECTED Final   Methicillin resistance NOT DETECTED NOT DETECTED Final   Streptococcus species NOT DETECTED NOT DETECTED Final   Streptococcus agalactiae NOT DETECTED NOT DETECTED Final   Streptococcus pneumoniae NOT DETECTED NOT DETECTED Final   Streptococcus pyogenes NOT DETECTED NOT DETECTED Final   Acinetobacter baumannii NOT DETECTED NOT DETECTED Final   Enterobacteriaceae species NOT DETECTED NOT DETECTED Final   Enterobacter cloacae complex NOT DETECTED NOT DETECTED Final   Escherichia coli NOT DETECTED NOT DETECTED Final   Klebsiella oxytoca NOT DETECTED NOT DETECTED Final   Klebsiella pneumoniae NOT DETECTED NOT DETECTED Final   Proteus species NOT DETECTED NOT DETECTED Final   Serratia marcescens NOT  DETECTED NOT DETECTED Final   Haemophilus influenzae NOT DETECTED NOT DETECTED Final   Neisseria meningitidis NOT DETECTED NOT DETECTED Final   Pseudomonas aeruginosa NOT DETECTED NOT DETECTED Final   Candida albicans NOT DETECTED NOT DETECTED Final   Candida glabrata NOT DETECTED NOT DETECTED Final   Candida krusei NOT DETECTED NOT DETECTED Final   Candida parapsilosis NOT DETECTED NOT DETECTED Final   Candida tropicalis NOT DETECTED NOT DETECTED Final    Comment: Performed at [facility], [address] [place], [place] [address]  Blood culture (routine x 2)     Status: Abnormal   Collection Time: 12/31/17 12:36 AM  Result Value Ref Range Status   Specimen Description   Final    BLOOD LEFT EXTERNAL JUGULAR Performed at [facility], [address] [place], [place] [address]    Special Requests   Final    BOTTLES DRAWN AEROBIC AND ANAEROBIC Blood Culture results may not be optimal due to an excessive volume of blood received in culture bottles Performed at [facility], [address] [place], [place] [address]    Culture  Setup Time   Final    GRAM POSITIVE COCCI AEROBIC BOTTLE ONLY CRITICAL RESULT CALLED TO, READ BACK BY AND VERIFIED WITH: DAVID BESANTI ON 01/01/18 AT 0533 QSD Performed at [facility], [address] [place],  [address]    Culture STAPHYLOCOCCUS HOMINIS (A)  Final   Report Status 01/03/2018 FINAL  Final   Organism ID, Bacteria STAPHYLOCOCCUS HOMINIS  Final      Susceptibility   Staphylococcus hominis - MIC*  CIPROFLOXACIN <=0.5 SENSITIVE Sensitive     ERYTHROMYCIN <=0.25 SENSITIVE Sensitive     GENTAMICIN <=0.5 SENSITIVE Sensitive     OXACILLIN <=0.25 SENSITIVE Sensitive     TETRACYCLINE <=1 SENSITIVE Sensitive     VANCOMYCIN 1 SENSITIVE Sensitive     TRIMETH/SULFA <=10 SENSITIVE Sensitive     CLINDAMYCIN <=0.25 SENSITIVE Sensitive     RIFAMPIN <=0.5 SENSITIVE Sensitive     Inducible Clindamycin  NEGATIVE Sensitive     * STAPHYLOCOCCUS HOMINIS  Body fluid culture     Status: None   Collection Time: 12/31/17 12:36 AM  Result Value Ref Range Status   Specimen Description   Final    FLUID LEFT KNEE Performed at [facility], [address] [place], [place] [address]    Special Requests   Final    NONE Performed at [facility], [address] [place], [place] [address]    Gram Stain   Final    ABUNDANT WBC PRESENT, PREDOMINANTLY PMN RARE GRAM POSITIVE COCCI IN PAIRS Performed at [facility], [place] [address], [place], [place] [address]    Culture FEW STAPHYLOCOCCUS AUREUS  Final   Report Status 01/02/2018 FINAL  Final   Organism ID, Bacteria STAPHYLOCOCCUS AUREUS  Final      Susceptibility   Staphylococcus aureus - MIC*    CIPROFLOXACIN <=0.5 SENSITIVE Sensitive     ERYTHROMYCIN <=0.25 SENSITIVE Sensitive     GENTAMICIN <=0.5 SENSITIVE Sensitive     OXACILLIN 0.5 SENSITIVE Sensitive     TETRACYCLINE <=1 SENSITIVE Sensitive     VANCOMYCIN <=0.5 SENSITIVE Sensitive     TRIMETH/SULFA <=10 SENSITIVE Sensitive     CLINDAMYCIN <=0.25 SENSITIVE Sensitive     RIFAMPIN <=0.5 SENSITIVE Sensitive     Inducible Clindamycin NEGATIVE Sensitive     * FEW STAPHYLOCOCCUS AUREUS  MRSA PCR Screening     Status: Abnormal   Collection Time: 01/01/18 11:25 AM  Result Value Ref Range Status   MRSA by PCR POSITIVE (A) NEGATIVE Final    Comment:        The GeneXpert MRSA Assay (FDA approved for NASAL specimens only), is one component of a comprehensive MRSA colonization surveillance program. It is not intended to diagnose MRSA infection nor to guide or monitor treatment for MRSA infections. RESULT CALLED TO, READ BACK BY AND VERIFIED WITH:  JESSICA [facility] AT 1300 01/01/18 SDR Performed at [facility], [address] [place], [place] [address]   Aerobic/Anaerobic Culture (surgical/deep wound)     Status: None (Preliminary result)   Collection  Time: 01/02/18  9:08 AM  Result Value Ref Range Status   Specimen Description   Final    WOUND Performed at [facility], [address], [place], [place] [address]    Special Requests   Final    left knee fluid Performed at [facility], [place]  [address]    Gram Stain   Final    FEW WBC PRESENT,BOTH PMN AND MONONUCLEAR NO ORGANISMS SEEN    Culture   Final    NO GROWTH < 24 HOURS Performed at [facility], [place] [address], [place], [place] [address]    Report Status PENDING  Incomplete  Aerobic/Anaerobic Culture (surgical/deep wound)     Status: None (Preliminary result)   Collection Time: 01/02/18  9:11 AM  Result Value Ref Range Status   Specimen Description   Final    WOUND Performed at [facility], [address], [place], [place] [address]    Special Requests   Final    tissue left knee Performed at [facility], [place], [place] [address]    Gram Stain   Final    RARE WBC PRESENT,BOTH PMN AND MONONUCLEAR NO ORGANISMS SEEN    Culture   Final    NO GROWTH < 24 HOURS Performed at [facility], [place] [address], [place], [place] [address]    Report Status PENDING  Incomplete  Culture, blood (Routine X 2) w Reflex to ID Panel     Status: None (Preliminary result)   Collection Time: 01/03/18 11:49 PM  Result Value Ref Range Status   Specimen Description BLOOD LEFT HAND  Final   Special Requests   Final    BOTTLES DRAWN AEROBIC AND ANAEROBIC Blood Culture results may not be optimal due to an excessive volume of blood received in culture bottles   Culture   Final    NO GROWTH < 12 HOURS Performed at [facility], [address] [place], [place] [address]    Report Status PENDING  Incomplete  Culture, blood (Routine X 2) w Reflex to ID Panel     Status: None (Preliminary result)   Collection Time: 01/03/18 11:56 PM  Result Value Ref Range Status   Specimen  Description BLOOD LEFT FATTY CASTS  Final   Special Requests   Final    BOTTLES DRAWN AEROBIC AND ANAEROBIC Blood Culture adequate volume   Culture   Final    NO GROWTH < 12 HOURS Performed at [facility], [address], [place], [place] [address]    Report Status PENDING  Incomplete    RADIOLOGY:  [place] Ekg Site Rite  Result Date: 01/04/2018 If Site Rite image not attached, placement could not be confirmed due to current cardiac rhythm.    Management plans discussed with the patient, family and they are in agreement.  CODE STATUS:     Code Status Orders  (From admission, onward)         Start     Ordered   12/31/17 0238  Full code  Continuous     12/31/17 0237        Code Status History    Date Active Date Inactive Code Status Order ID Comments User Context   10/30/2017 1237 11/03/2017 1923 Full Code [id number]  Earnestine Leys, MD Inpatient   01/06/2016 2005 01/12/2016 1750 Full Code [id number]  Harvie Bridge, DO Inpatient   06/01/2015 1847 06/02/2015 1909 Full Code [id number]  Nicholes Mango, MD Inpatient   05/13/2015 0111 05/14/2015 1255 Full Code [id number]  Saundra Shelling, MD Inpatient      TOTAL TIME TAKING CARE OF THIS PATIENT: *40* minutes.    Fritzi Mandes M.D on 01/04/2018 at 7:50 AM  Between 7am to 6pm - Pager - [phone] After 6pm go to [email or web address] - password [email or web address] [facility]  Office  [phone]  CC: Primary care physician; Center, [facility]

## 2018-01-04 NOTE — Progress Notes (Signed)
[facility]   Next of RUE:AVWUJWkin:Roland Dorinda HillHinkle (brother) [phone]  pcp Shane CrutchLinda Markley at [facility]  If patient discharges after hours, please call [phone].   Dimple CaseyJason E Hinton 01/04/2018, 11:29 AM

## 2018-01-04 NOTE — Progress Notes (Signed)
Discharge instructions and prescription given to pt. IVs removed and PICC line in right arm intact and flushed. Pt getting dressed and will discharge home with friend.

## 2018-01-04 NOTE — Care Management Note (Signed)
Case Management Note  Patient Details  Name: [name] MRN: [id number] Date of Birth: 04-07-1960  Subjective/Objective:                  RNCM spoke with patient regarding home IV antibiotics and home health agency choice.  He did not have preference of agencies. PCP with [facility]. He lives with a friend and states he has support needed. He has a rolling walker and cane available for use at home.  PICC placement today.   Action/Plan:   Home health agency list per CMS.gov provided to patient for review. [facility]   Expected Discharge Date:  01/04/18               Expected Discharge Plan:     In-House Referral:     Discharge planning Services  CM Consult  Post Acute Care Choice:  Home Health, Durable Medical Equipment Choice offered to:  Patient  DME Arranged:  IV pump/equipment DME Agency:  [facility]  HH Arranged:  [facility]  [facility]  Status of Service:  Completed, signed off  If discussed at Long Length of Stay Meetings, dates discussed:    Additional Comments:  Collie Siadngela Kalila Adkison, RN 01/04/2018, 8:04 AM

## 2018-01-04 NOTE — Care Management (Signed)
Home health RN will come to administer and show patient how to self-administer home IV antibiotics prior to discharge (2PM does); PICC RN here to place PICC. PT pending.

## 2018-01-04 NOTE — Progress Notes (Signed)
Subjective: 2 Days Post-Op Procedure(s) (LRB): INCISION AND DRAINAGE LEFT KNEE (Left) poly exchange of tibia and patella left knee (Left)   Has gotten PICC line placed.  Scheduled for 6 weeks of antibiotics IV,  Have discussed the need for him to comply with our instructions completely.  He was extremely noncompliant prior to the presentation with infection and I explained to him that that is why it got to the point that it did.  I also explained to them that if we cannot  eradicate the infection he may end up with a above-the-knee amputation ultimately.  He will get IV of antibiotics at home and follow-up with in 1 week in our office.  He will use a knee immobilizer keep the knee in extension since he tends to get a flexion contracture.  Patient reports pain as moderate.  Objective:   VITALS:   Vitals:   01/03/18 2342 01/04/18 0806  BP: 92/63 95/72  Pulse: 89 92  Resp: 18 18  Temp: 98.1 F (36.7 C) 97.8 F (36.6 C)  SpO2: 96% 97%    Neurologically intact ABD soft Neurovascular intact Sensation intact distally Intact pulses distally Dorsiflexion/Plantar flexion intact Incision: dressing C/D/I  LABS Recent Labs    01/04/18 0338  HGB 8.4*  HCT 25.7*  WBC 7.7  PLT 253    Recent Labs    01/02/18 0439 01/04/18 0338  NA  --  136  K  --  3.2*  BUN  --  8  CREATININE 0.79 0.69  GLUCOSE  --  103*    No results for input(s): LABPT, INR in the last 72 hours.   Assessment/Plan: 2 Days Post-Op Procedure(s) (LRB): INCISION AND DRAINAGE LEFT KNEE (Left) poly exchange of tibia and patella left knee (Left)   Advance diet Up with therapy D/C IV fluids Discharge home with home health pt  ASA 325 mg bid  RTC I week

## 2018-01-04 NOTE — Evaluation (Signed)
Physical Therapy Evaluation [name] MRN: [id number] DOB: 1960-10-30 Today's Date: 01/04/2018   History of Present Illness  Patient is a 57 year old male with recent L TKA admitted for I&D of L knee after being found postive for Sepsis with gram-positive cocci.  PMH includes PTSD, depression, anxiety, bipolar disorder and Htn.    Clinical Impression  Pt is a 57 year old male who lives in a one story home alone.  He is independent without an AD at baseline but [facility].  He presented with pain in L knee and R hip but otherwise demonstrated good strength.  Pt performed all mobility with knee immobilizer donned.  Pt able to perform bed mobility mod I and STS with supervision for use of RW.  He was able to ambulate 100 ft with some fatigue of UE's and antalgic gait.  Pt is capable of stepping up onto 1 step to enter his home.  He will continue to benefit from skilled PT with focus on strength, mobility and pain management.  Pt will benefit from home health PT to assist in return to baseline level of function following discharge.    Follow Up Recommendations Home health PT;Supervision - Intermittent    Equipment Recommendations  None recommended by PT    Recommendations for Other Services       Precautions / Restrictions Restrictions Weight Bearing Restrictions: Yes LLE Weight Bearing: Weight bearing as tolerated      Mobility  Bed Mobility Overal bed mobility: Modified Independent             General bed mobility comments: Increased time and use of bed rail  Transfers Overall transfer level: Needs assistance Equipment used: Rolling walker (2 wheeled) Transfers: Sit to/from Stand Sit to Stand: Supervision         General transfer comment: Required increased time but able to rise without assistance.  Some VC's to remind pt of proper use of RW and body mechanics.  Ambulation/Gait Ambulation/Gait  assistance: Supervision Gait Distance (Feet): 150 Feet Assistive device: Rolling walker (2 wheeled) Gait Pattern/deviations: Step-through pattern   Gait velocity interpretation: <1.8 ft/sec, indicate of risk for recurrent falls General Gait Details: Slow antalgic gait with heavy reliance on RW to offload L LE during stance phase.  Required 2 rest breaks with report of UE fatigue.  Stairs            Wheelchair Mobility    Modified Rankin (Stroke Patients Only)       Balance Overall balance assessment: Needs assistance Sitting-balance support: Feet supported Sitting balance-Leahy Scale: Good     Standing balance support: Bilateral upper extremity supported Standing balance-Leahy Scale: Fair                               Pertinent Vitals/Pain Pain Assessment: 0-10 Pain Score: 7  Pain Location: L knee; also reports pain in R hip Pain Descriptors / Indicators: Aching Pain Intervention(s): Limited activity within patient's tolerance    Home Living Family/patient expects to be discharged to:: Private residence Living Arrangements: Non-relatives/Friends Available Help at Discharge: Family;Available PRN/intermittently Type of Home: House Home Access: Stairs to enter Entrance Stairs-Rails: Can reach both Entrance Stairs-Number of Steps: 1 Home Layout: One level Home Equipment: Walker - 2 wheels;Cane - single point      Prior Function Level of Independence: Independent with assistive device(s)  Comments: [facility] for household ambulation.     Hand Dominance        Extremity/Trunk Assessment   Upper Extremity Assessment Upper Extremity Assessment: Overall WFL for tasks assessed    Lower Extremity Assessment Lower Extremity Assessment: Overall WFL for tasks assessed;LLE deficits/detail LLE: Unable to fully assess due to pain;Unable to fully assess due to immobilization(Knee immobilizer donned throughout  evaluation.)    Cervical / Trunk Assessment Cervical / Trunk Assessment: Normal  Communication   Communication: No difficulties  Cognition Arousal/Alertness: Awake/alert Behavior During Therapy: WFL for tasks assessed/performed Overall Cognitive Status: Within Functional Limits for tasks assessed                                        General Comments      Exercises     Assessment/Plan    PT Assessment Patient needs continued PT services  PT Problem List Decreased strength;Decreased mobility;Decreased range of motion;Decreased balance;Decreased activity tolerance;Pain       PT Treatment Interventions DME instruction;Functional mobility training;Balance training;Gait training;Therapeutic activities;Stair training;Therapeutic exercise;Patient/family education    PT Goals (Current goals can be found in the Care Plan section)  Acute Rehab PT Goals Patient Stated Goal: To return to ambulation without an AD. PT Goal Formulation: With patient Time For Goal Achievement: 01/18/18 Potential to Achieve Goals: Good    Frequency 7X/week   Barriers to discharge        Co-evaluation               AM-PAC PT "6 Clicks" Mobility  Outcome Measure Help needed turning from your back to your side while in a flat bed without using bedrails?: A Little Help needed moving from lying on your back to sitting on the side of a flat bed without using bedrails?: A Little Help needed moving to and from a bed to a chair (including a wheelchair)?: A Little Help needed standing up from a chair using your arms (e.g., wheelchair or bedside chair)?: A Little Help needed to walk in hospital room?: A Little Help needed climbing 3-5 steps with a railing? : A Little 6 Click Score: 18    End of Session Equipment Utilized During Treatment: Gait belt Activity Tolerance: Patient tolerated treatment well;Patient limited by fatigue Patient left: in bed;with call bell/phone within  reach Nurse Communication: Mobility status PT Visit Diagnosis: Unsteadiness on feet (R26.81);Muscle weakness (generalized) (M62.81);Pain Pain - Right/Left: Left Pain - part of body: Knee    Time: [id number] PT Time Calculation (min) (ACUTE ONLY): 21 min   Charges:   PT Evaluation $PT Eval Low Complexity: 1 Low          Glenetta HewSarah Manuel Dall, PT, DPT   Glenetta HewSarah Kamyah Wilhelmsen 01/04/2018, 3:47 PM

## 2018-01-04 NOTE — Progress Notes (Signed)
Peripherally Inserted Central Catheter/Midline Placement  The IV Nurse has discussed with the patient and/or persons authorized to consent for the patient, the purpose of this procedure and the potential benefits and risks involved with this procedure.  The benefits include less needle sticks, lab draws from the catheter, and the patient may be discharged home with the catheter. Risks include, but not limited to, infection, bleeding, blood clot (thrombus formation), and puncture of an artery; nerve damage and irregular heartbeat and possibility to perform a PICC exchange if needed/ordered by physician.  Alternatives to this procedure were also discussed.  Bard Power PICC patient education guide, fact sheet on infection prevention and patient information card has been provided to patient /or left at bedside.    PICC/Midline Placement Documentation  PICC Single Lumen 01/04/18 PICC Right Brachial 41 cm 0 cm (Active)  Indication for Insertion or Continuance of Line Home intravenous therapies (PICC only) 01/04/2018 10:00 AM  Exposed Catheter (cm) 0 cm 01/04/2018 10:00 AM  Site Assessment Clean;Dry;Intact 01/04/2018 10:00 AM  Line Status Flushed;Blood return noted 01/04/2018 10:00 AM  Dressing Type Transparent 01/04/2018 10:00 AM  Dressing Status Clean;Dry;Intact;Antimicrobial disc in place 01/04/2018 10:00 AM  Dressing Change Due 01/11/18 01/04/2018 10:00 AM       Stacie GlazeJoyce, Liliann File Horton 01/04/2018, 10:41 AM

## 2018-01-04 NOTE — Clinical Social Work Note (Signed)
CSW received referral for SNF.  Case discussed with case manager and plan is to discharge home with home health.  CSW to sign off please re-consult if social work needs arise.  [facility] R. Kanoelani Dobies, MSW, LCSWA [phone]  

## 2018-01-04 NOTE — Progress Notes (Signed)
PHARMACY CONSULT NOTE FOR:  OUTPATIENT  PARENTERAL ANTIBIOTIC THERAPY (OPAT)  Indication: MSSA prosthetic joint infection Regimen: cefazolin 2 gm IV Q8H End date: 02/13/2017  IV antibiotic discharge orders are pended. To discharging provider:  please sign these orders via discharge navigator,  Select New Orders & click on the button choice - Manage This Unsigned Work.     Thank you for allowing pharmacy to be a part of this patient's care.  Victor FrostNathan A Renly [name], PharmD, BCPS Clinical Pharmacist 01/04/2018, 7:35 AM

## 2018-01-05 LAB — HCV RNA QUANT RFLX ULTRA OR GENOTYP
HCV RNA Qnt(log copy/mL): UNDETERMINED log10 IU/mL
HepC Qn: NOT DETECTED IU/mL

## 2018-01-05 LAB — HEPATITIS PANEL, ACUTE
HCV Ab: 3.3 s/co ratio — ABNORMAL HIGH (ref 0.0–0.9)
HEP B S AG: NEGATIVE
Hep A IgM: NEGATIVE
Hep B C IgM: NEGATIVE

## 2018-01-07 LAB — AEROBIC/ANAEROBIC CULTURE W GRAM STAIN (SURGICAL/DEEP WOUND)

## 2018-01-09 ENCOUNTER — Telehealth: Payer: Self-pay | Admitting: Licensed Clinical Social Worker

## 2018-01-09 LAB — CULTURE, BLOOD (ROUTINE X 2)
Culture: NO GROWTH
Culture: NO GROWTH
Special Requests: ADEQUATE

## 2018-01-09 NOTE — Telephone Encounter (Signed)
Verbal orders given for nursing to go out once weekly through 02/13/18. Patient is on IV antibiotics

## 2018-02-05 ENCOUNTER — Telehealth: Payer: Self-pay | Admitting: Licensed Clinical Social Worker

## 2018-02-05 NOTE — Telephone Encounter (Signed)
Okay 

## 2018-02-05 NOTE — Telephone Encounter (Signed)
Patient left before his appointment with Delice Bison home health nurse. He called her to tell her he needed to put his dogs up and then she could come out around 10-10:30 am. She arrived around 10:30 patient was gone and left no way of contact. She was supposed to get labs and do dressing change today. Nurse will try to contact him again today to go out.

## 2018-02-06 ENCOUNTER — Encounter: Payer: Self-pay | Admitting: Infectious Diseases

## 2018-02-06 ENCOUNTER — Ambulatory Visit: Payer: Medicaid Other | Attending: Infectious Diseases | Admitting: Infectious Diseases

## 2018-02-06 VITALS — BP 137/93 | HR 107 | Temp 97.2°F | Ht 73.0 in | Wt 176.0 lb

## 2018-02-06 DIAGNOSIS — Z87891 Personal history of nicotine dependence: Secondary | ICD-10-CM

## 2018-02-06 DIAGNOSIS — B9561 Methicillin susceptible Staphylococcus aureus infection as the cause of diseases classified elsewhere: Secondary | ICD-10-CM

## 2018-02-06 DIAGNOSIS — Z79899 Other long term (current) drug therapy: Secondary | ICD-10-CM

## 2018-02-06 DIAGNOSIS — T8450XD Infection and inflammatory reaction due to unspecified internal joint prosthesis, subsequent encounter: Secondary | ICD-10-CM

## 2018-02-06 DIAGNOSIS — M00062 Staphylococcal arthritis, left knee: Secondary | ICD-10-CM | POA: Diagnosis not present

## 2018-02-06 DIAGNOSIS — T8454XD Infection and inflammatory reaction due to internal left knee prosthesis, subsequent encounter: Secondary | ICD-10-CM | POA: Diagnosis not present

## 2018-02-06 DIAGNOSIS — F319 Bipolar disorder, unspecified: Secondary | ICD-10-CM

## 2018-02-06 DIAGNOSIS — F1911 Other psychoactive substance abuse, in remission: Secondary | ICD-10-CM

## 2018-02-06 DIAGNOSIS — Z888 Allergy status to other drugs, medicaments and biological substances status: Secondary | ICD-10-CM

## 2018-02-06 DIAGNOSIS — T8454XA Infection and inflammatory reaction due to internal left knee prosthesis, initial encounter: Secondary | ICD-10-CM | POA: Insufficient documentation

## 2018-02-06 DIAGNOSIS — A4901 Methicillin susceptible Staphylococcus aureus infection, unspecified site: Secondary | ICD-10-CM

## 2018-02-06 DIAGNOSIS — Z95828 Presence of other vascular implants and grafts: Secondary | ICD-10-CM

## 2018-02-06 DIAGNOSIS — K210 Gastro-esophageal reflux disease with esophagitis: Secondary | ICD-10-CM | POA: Diagnosis not present

## 2018-02-06 DIAGNOSIS — Z96653 Presence of artificial knee joint, bilateral: Secondary | ICD-10-CM

## 2018-02-06 DIAGNOSIS — Z8619 Personal history of other infectious and parasitic diseases: Secondary | ICD-10-CM

## 2018-02-06 HISTORY — DX: Infection and inflammatory reaction due to internal left knee prosthesis, initial encounter: T84.54XA

## 2018-02-06 NOTE — Progress Notes (Signed)
NAME: [name]  DOB: Nov 01, 1960  MRN: [id number]  Date/Time: 02/06/2018 12:02 PM Subjective:  Patient here for follow-up after hospital discharge. ?was in [facility] between 12/21-12/26/19 [name] is a 58 y.o. male with a history of history of Bipolar disorder b/l TKA  Was in [facility] for infected left knee. Pt on 10/30/17 underwent left TKA, and when the sutures were removed he noted some discharge and  pain. He came to the ED on 11/25 and was given keflex for superficial infection. He has not had relief and the swelling got worse with increasing pain and redness at the surgical site. He had subjective fever and chills. He came to the Ed on 12/22. arthrocentesis of the left knee done in the ED showed staph aureus-He had I/D of the joint and poly exchange of tibia an patella on 01/02/18. Culture was positive for MSSA- blood culture had staph hominis which was likely a contaminant. 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 Says he was told about HEPC and that he had cleared it without treatment.  He had rt TKA in 2017 following which he had pericarditis . He also had severe reflux esophagitis on a EGD Past Medical History:  Diagnosis Date  . Anxiety   . Arthritis    knees and hands  . Bipolar disorder ([place])   . Depression   . GERD (gastroesophageal reflux disease)   . Hepatitis    HEP "C"  . History of kidney stones   . Hypertension   . Kidney stones   . Pericarditis 05/2015   a. echo 5/17: EF 60-65%, no RWMA, LV dias fxn nl, LA mildly dilated, RV sys fxn nl, PASP nl, moderate sized circumferential pericardial effusion was identified, 2.12 cm around the LV free wall, <1 cm around the RV free wall. Features were not c/w tamponade  physiology  . PTSD (post-traumatic stress disorder)    Witnessed brother's suicide.  Marland Kitchen Restless leg syndrome   . Syncope     Past Surgical History:  Procedure Laterality Date  . CYSTOSCOPY WITH URETEROSCOPY AND STENT PLACEMENT    . ESOPHAGOGASTRODUODENOSCOPY N/A 01/11/2016   Procedure: ESOPHAGOGASTRODUODENOSCOPY (EGD);  Surgeon: Wilford Corner, MD;  Location: [facility] ENDOSCOPY;  Service: Endoscopy;  Laterality: N/A;  . INCISION AND DRAINAGE ABSCESS Left 01/02/2018   Procedure: INCISION AND DRAINAGE LEFT KNEE;  Surgeon: Earnestine Leys, MD;  Location: [facility] ORS;  Service: Orthopedics;  Laterality: Left;  . JOINT REPLACEMENT Right    TKR  . KNEE ARTHROSCOPY Right 06/25/2014   Procedure: ARTHROSCOPY KNEE;  Surgeon: Earnestine Leys, MD;  Location: [facility] ORS;  Service: Orthopedics;  Laterality: Right;  partial arthroscopic medial menisectomy  . TOTAL KNEE ARTHROPLASTY Right 04/22/2015   Procedure: TOTAL KNEE ARTHROPLASTY;  Surgeon: Earnestine Leys, MD;  Location: [facility] ORS;  Service: Orthopedics;  Laterality: Right;  . TOTAL KNEE ARTHROPLASTY Left 10/30/2017   Procedure: TOTAL KNEE ARTHROPLASTY;  Surgeon: Earnestine Leys, MD;  Location: [facility] ORS;  Service: Orthopedics;  Laterality: Left;  . TOTAL KNEE REVISION Left 01/02/2018   Procedure: poly exchange of tibia and patella left knee;  Surgeon: Earnestine Leys, MD;  Location: [facility] ORS;  Service: Orthopedics;  Laterality: Left;  Social History   Socioeconomic History  . Marital status: Single    Spouse name: Not on file  . Number of children: Not on file  . Years of education: Not on file  . Highest education level: Not on file  Occupational History  . Not on file  Social Needs  . Financial resource strain: Not on file  . Food insecurity:    Worry: Not on file    Inability: Not on file  . Transportation needs:    Medical: Not on file    Non-medical: Not on file  Tobacco Use  . Smoking status: Former Smoker    Packs/day: 0.75    Years: 20.00     Pack years: 15.00    Types: Cigarettes    Last attempt to quit: 05/16/1984    Years since quitting: 33.7  . Smokeless tobacco: Never Used  Substance and Sexual Activity  . Alcohol use: Yes    Comment: occ  . Drug use: No  . Sexual activity: Not on file  Lifestyle  . Physical activity:    Days per week: Not on file    Minutes per session: Not on file  . Stress: Not on file  Relationships  . Social connections:    Talks on phone: Not on file    Gets together: Not on file    Attends religious service: Not on file    Active member of club or organization: Not on file    Attends meetings of clubs or organizations: Not on file    Relationship status: Not on file  . Intimate partner violence:    Fear of current or ex partner: Not on file    Emotionally abused: Not on file    Physically abused: Not on file    Forced sexual activity: Not on file  Other Topics Concern  . Not on file  Social History Narrative  . Not on file    Family History  Problem Relation Age of Onset  . CVA Mother        deceased at age 21  . Depression Brother        Died by suicide at age 32   Allergies  Allergen Reactions  . Toradol [Ketorolac Tromethamine] Hives   ? Current Outpatient Medications  Medication Sig Dispense Refill  . aspirin EC 325 MG tablet Take 1 tablet (325 mg total) by mouth daily. 30 tablet 0  . ceFAZolin (ANCEF) IVPB Inject 2 g into the vein every 8 (eight) hours. Indication:  MSSA prosthetic joint infection Last Day of Therapy:  02/13/2017 Labs - Once weekly (Monday):  CBC/D and CMP, Labs - Every other week (Monday):  ESR and CRP  Pull PICC at end of therapy 123 Units 0  . divalproex (DEPAKOTE) 500 MG DR tablet Take 500 mg by mouth 2 (two) times daily.    . enalapril (VASOTEC) 10 MG tablet Take 10 mg by mouth daily.    Marland Kitchen FLUoxetine (PROZAC) 20 MG capsule Take 80 mg by mouth daily.     Marland Kitchen gabapentin (NEURONTIN) 400 MG capsule Take 1 capsule (400 mg total) by mouth 2 (two)  times daily. 60 capsule 3  . meloxicam (MOBIC) 15 MG tablet Take 1 tablet (15 mg total) by mouth daily. 30 tablet 3  . QUEtiapine (SEROQUEL) 50 MG tablet Take 50 mg by mouth 4 (four) times daily as needed (for agitation).     Marland Kitchen REXULTI 2 MG TABS TK 1 T PO HS  3  .  SEROQUEL XR 400 MG 24 hr tablet Take 400 mg by mouth at bedtime.   2  . HYDROcodone-acetaminophen (NORCO/VICODIN) 5-325 MG tablet Take 1-2 tablets by mouth every 6 (six) hours as needed for moderate pain. (Patient not taking: Reported on 02/06/2018) 20 tablet 0  . zolpidem (AMBIEN) 10 MG tablet Take 10 mg by mouth at bedtime as needed for sleep.     No current facility-administered medications for this visit.      Abtx:  Anti-infectives (From admission, onward)   None      REVIEW OF SYSTEMS:  Const: negative fever, negative chills, negative weight loss Eyes: negative diplopia or visual changes, negative eye pain ENT: negative coryza, negative sore throat Resp: negative cough, hemoptysis, dyspnea Cards: negative for chest pain, palpitations, lower extremity edema GU: negative for frequency, dysuria and hematuria GI: Negative for abdominal pain, diarrhea, bleeding, constipation Skin: negative for rash and pruritus Heme: negative for easy bruising and gum/nose bleeding MS: negative for myalgias, arthralgias, back pain and muscle weakness Neurolo:negative for headaches, dizziness, vertigo, memory problems  Psych: bipolar disorder Endocrine: no polyuria Allergy/Immunology- toradol?  Objective:  VITALS:  BP (!) 137/93 (BP Location: Left Arm, Patient Position: Sitting, Cuff Size: Normal)   Pulse (!) 107   Temp (!) 97.2 F (36.2 C) (Oral)   Ht _0  (1.854 m)   Wt 176 lb (79.8 kg)   BMI 23.22 kg/m  PHYSICAL EXAM:  General: Alert, cooperative, no distress, appears stated age.  Head: Normocephalic, without obvious abnormality, atraumatic. Eyes: Conjunctivae clear, anicteric sclerae. Pupils are equal ENT Nares normal. No  drainage or sinus tenderness. Lips, mucosa, and tongue normal. No Thrush Neck: Supple, symmetrical, no adenopathy, thyroid: non tender no carotid bruit and no JVD. Back: No CVA tenderness. Lungs: Clear to auscultation bilaterally. No Wheezing or Rhonchi. No rales. Heart: Regular rate and rhythm, no murmur, rub or gallop. Abdomen: Soft, non-tender,not distended. Bowel sounds normal. No masses Extremities: rt PICC site- clean Left knee- surgical scar health- some swelling- but no tenderness or erythema- movt not painful atraumatic, no cyanosis. Some edema left leg. No clubbing Skin: No rashes or lesions. Or bruising Lymph: Cervical, supraclavicular normal. Neurologic: Grossly non-focal Pertinent Labs Lab Results CBC    Component Value Date/Time   WBC 7.7 01/04/2018 0338   RBC 3.13 (L) 01/04/2018 0338   HGB 8.4 (L) 01/04/2018 0338   HCT 25.7 (L) 01/04/2018 0338   PLT 253 01/04/2018 0338   MCV 82.1 01/04/2018 0338   MCH 26.8 01/04/2018 0338   MCHC 32.7 01/04/2018 0338   RDW 16.1 (H) 01/04/2018 0338   LYMPHSABS 1.0 01/04/2018 0338   MONOABS 0.7 01/04/2018 0338   EOSABS 0.1 01/04/2018 0338   BASOSABS 0.0 01/04/2018 0338   Labs reviewed from 01/29/18  ? Impression/Recommendation 58 y.o.malewith a history of Bipolar disorder b/l TKA has the following?  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 ? ? ___________________________________________________ Discussed with patient Will follow in 3-4 months

## 2018-02-06 NOTE — Progress Notes (Signed)
NAME: [name]  DOB: [birth year]  MRN: [id number]  Date/Time: 02/06/2018 12:02 PM Subjective:  REASON FOR CONSULT:  ? WOODROW DRAB is a 58 y.o. with a history of Past Medical History:  Diagnosis Date  . Anxiety   . Arthritis    knees and hands  . Bipolar disorder ([place])   . Depression   . GERD (gastroesophageal reflux disease)   . Hepatitis    HEP "C"  . History of kidney stones   . Hypertension   . Kidney stones   . Pericarditis 05/2015   a. echo 5/17: EF 60-65%, no RWMA, LV dias fxn nl, LA mildly dilated, RV sys fxn nl, PASP nl, moderate sized circumferential pericardial effusion was identified, 2.12 cm around the LV free wall, <1 cm around the RV free wall. Features were not c/w tamponade physiology  . PTSD (post-traumatic stress disorder)    Witnessed brother's suicide.  Marland Kitchen Restless leg syndrome   . Syncope     Past Surgical History:  Procedure Laterality Date  . CYSTOSCOPY WITH URETEROSCOPY AND STENT PLACEMENT    . ESOPHAGOGASTRODUODENOSCOPY N/A 01/11/2016   Procedure: ESOPHAGOGASTRODUODENOSCOPY (EGD);  Surgeon: Wilford Corner, MD;  Location: [facility] ENDOSCOPY;  Service: Endoscopy;  Laterality: N/A;  . INCISION AND DRAINAGE ABSCESS Left 01/02/2018   Procedure: INCISION AND DRAINAGE LEFT KNEE;  Surgeon: Earnestine Leys, MD;  Location: [facility] ORS;  Service: Orthopedics;  Laterality: Left;  . JOINT REPLACEMENT Right    TKR  . KNEE ARTHROSCOPY Right 06/25/2014   Procedure: ARTHROSCOPY KNEE;  Surgeon: Earnestine Leys, MD;  Location: [facility] ORS;  Service: Orthopedics;  Laterality: Right;  partial arthroscopic medial menisectomy  . TOTAL KNEE ARTHROPLASTY Right 04/22/2015   Procedure: TOTAL KNEE ARTHROPLASTY;  Surgeon: Earnestine Leys, MD;  Location: [facility] ORS;  Service: Orthopedics;  Laterality: Right;  . TOTAL KNEE ARTHROPLASTY Left 10/30/2017   Procedure: TOTAL KNEE ARTHROPLASTY;  Surgeon: Earnestine Leys, MD;  Location: [facility] ORS;  Service: Orthopedics;  Laterality: Left;  . TOTAL KNEE  REVISION Left 01/02/2018   Procedure: poly exchange of tibia and patella left knee;  Surgeon: Earnestine Leys, MD;  Location: [facility] ORS;  Service: Orthopedics;  Laterality: Left;    Social History   Socioeconomic History  . Marital status: Single    Spouse name: Not on file  . Number of children: Not on file  . Years of education: Not on file  . Highest education level: Not on file  Occupational History  . Not on file  Social Needs  . Financial resource strain: Not on file  . Food insecurity:    Worry: Not on file    Inability: Not on file  . Transportation needs:    Medical: Not on file    Non-medical: Not on file  Tobacco Use  . Smoking status: Former Smoker    Packs/day: 0.75    Years: 20.00    Pack years: 15.00    Types: Cigarettes    Last attempt to quit: 05/16/1984    Years since quitting: 33.7  . Smokeless tobacco: Never Used  Substance and Sexual Activity  . Alcohol use: Yes    Comment: occ  . Drug use: No  . Sexual activity: Not on file  Lifestyle  . Physical activity:    Days per week: Not on file    Minutes per session: Not on file  . Stress: Not on file  Relationships  . Social connections:    Talks on phone: Not on file  Gets together: Not on file    Attends religious service: Not on file    Active member of club or organization: Not on file    Attends meetings of clubs or organizations: Not on file    Relationship status: Not on file  . Intimate partner violence:    Fear of current or ex partner: Not on file    Emotionally abused: Not on file    Physically abused: Not on file    Forced sexual activity: Not on file  Other Topics Concern  . Not on file  Social History Narrative  . Not on file    Family History  Problem Relation Age of Onset  . CVA Mother        deceased at age 48  . Depression Brother        Died by suicide at age 76   Allergies  Allergen Reactions  . Toradol [Ketorolac Tromethamine] Hives   ID  Recent   Procedure Surgery Injections Trauma Sick contacts Travel Antibiotic use Food- raw/exotic Steroid/immune suppressants/splenectomy/Hardware Animal bites Tick exposure Water sports Fishing/hunting/animal bird exposure ? Current Outpatient Medications  Medication Sig Dispense Refill  . aspirin EC 325 MG tablet Take 1 tablet (325 mg total) by mouth daily. 30 tablet 0  . ceFAZolin (ANCEF) IVPB Inject 2 g into the vein every 8 (eight) hours. Indication:  MSSA prosthetic joint infection Last Day of Therapy:  02/13/2017 Labs - Once weekly (Monday):  CBC/D and CMP, Labs - Every other week (Monday):  ESR and CRP  Pull PICC at end of therapy 123 Units 0  . divalproex (DEPAKOTE) 500 MG DR tablet Take 500 mg by mouth 2 (two) times daily.    . enalapril (VASOTEC) 10 MG tablet Take 10 mg by mouth daily.    Marland Kitchen FLUoxetine (PROZAC) 20 MG capsule Take 80 mg by mouth daily.     Marland Kitchen gabapentin (NEURONTIN) 400 MG capsule Take 1 capsule (400 mg total) by mouth 2 (two) times daily. 60 capsule 3  . meloxicam (MOBIC) 15 MG tablet Take 1 tablet (15 mg total) by mouth daily. 30 tablet 3  . QUEtiapine (SEROQUEL) 50 MG tablet Take 50 mg by mouth 4 (four) times daily as needed (for agitation).     Marland Kitchen REXULTI 2 MG TABS TK 1 T PO HS  3  . SEROQUEL XR 400 MG 24 hr tablet Take 400 mg by mouth at bedtime.   2  . HYDROcodone-acetaminophen (NORCO/VICODIN) 5-325 MG tablet Take 1-2 tablets by mouth every 6 (six) hours as needed for moderate pain. (Patient not taking: Reported on 02/06/2018) 20 tablet 0  . zolpidem (AMBIEN) 10 MG tablet Take 10 mg by mouth at bedtime as needed for sleep.     No current facility-administered medications for this visit.      Abtx:  Anti-infectives (From admission, onward)   None      REVIEW OF SYSTEMS:  Const: negative fever, negative chills, negative weight loss Eyes: negative diplopia or visual changes, negative eye pain ENT: negative coryza, negative sore throat Resp: negative  cough, hemoptysis, dyspnea Cards: negative for chest pain, palpitations, lower extremity edema GU: negative for frequency, dysuria and hematuria GI: Negative for abdominal pain, diarrhea, bleeding, constipation Skin: negative for rash and pruritus Heme: negative for easy bruising and gum/nose bleeding MS: negative for myalgias, arthralgias, back pain and muscle weakness Neurolo:negative for headaches, dizziness, vertigo, memory problems  Psych: negative for feelings of anxiety, depression  Endocrine: negative for thyroid, diabetes  Allergy/Immunology- negative for any medication or food allergies ? Pertinent Positives include : Objective:  VITALS:  BP (!) 137/93 (BP Location: Left Arm, Patient Position: Sitting, Cuff Size: Normal)   Pulse (!) 107   Temp (!) 97.2 F (36.2 C) (Oral)   Ht '6\' 1"'  (1.854 m)   Wt 176 lb (79.8 kg)   BMI 23.22 kg/m  PHYSICAL EXAM:  General: Alert, cooperative, no distress, appears stated age.  Head: Normocephalic, without obvious abnormality, atraumatic. Eyes: Conjunctivae clear, anicteric sclerae. Pupils are equal ENT Nares normal. No drainage or sinus tenderness. Lips, mucosa, and tongue normal. No Thrush Neck: Supple, symmetrical, no adenopathy, thyroid: non tender no carotid bruit and no JVD. Back: No CVA tenderness. Lungs: Clear to auscultation bilaterally. No Wheezing or Rhonchi. No rales. Heart: Regular rate and rhythm, no murmur, rub or gallop. Abdomen: Soft, non-tender,not distended. Bowel sounds normal. No masses Extremities: atraumatic, no cyanosis. No edema. No clubbing Skin: No rashes or lesions. Or bruising Lymph: Cervical, supraclavicular normal. Neurologic: Grossly non-focal Pertinent Labs Lab Results CBC    Component Value Date/Time   WBC 7.7 01/04/2018 0338   RBC 3.13 (L) 01/04/2018 0338   HGB 8.4 (L) 01/04/2018 0338   HCT 25.7 (L) 01/04/2018 0338   PLT 253 01/04/2018 0338   MCV 82.1 01/04/2018 0338   MCH 26.8 01/04/2018 0338    MCHC 32.7 01/04/2018 0338   RDW 16.1 (H) 01/04/2018 0338   LYMPHSABS 1.0 01/04/2018 0338   MONOABS 0.7 01/04/2018 0338   EOSABS 0.1 01/04/2018 0338   BASOSABS 0.0 01/04/2018 0338    CMP Latest Ref Rng & Units 01/04/2018 01/02/2018 12/30/2017  Glucose 70 - 99 mg/dL 103(H) - 142(H)  BUN 6 - 20 mg/dL 8 - 13  Creatinine 0.61 - 1.24 mg/dL 0.69 0.79 0.93  Sodium 135 - 145 mmol/L 136 - 133(L)  Potassium 3.5 - 5.1 mmol/L 3.2(L) - 3.6  Chloride 98 - 111 mmol/L 102 - 105  CO2 22 - 32 mmol/L 26 - 17(L)  Calcium 8.9 - 10.3 mg/dL 8.6(L) - 9.1  Total Protein 6.5 - 8.1 g/dL 6.3(L) - 7.9  Total Bilirubin 0.3 - 1.2 mg/dL 0.4 - 0.6  Alkaline Phos 38 - 126 U/L 83 - 82  AST 15 - 41 U/L 10(L) - 23  ALT 0 - 44 U/L 7 - 15      Microbiology: No results found for this or any previous visit (from the past 240 hour(s)). IMAGING RESULTS: ? Impression/Recommendation ? ? ? ___________________________________________________ Discussed with patient, requesting provider Note:  This document was prepared using Dragon voice recognition software and may include unintentional dictation errors.

## 2018-02-06 NOTE — Patient Instructions (Addendum)
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

## 2018-02-09 ENCOUNTER — Other Ambulatory Visit: Payer: Self-pay | Admitting: Infectious Diseases

## 2018-02-09 MED ORDER — RIFAMPIN 300 MG PO CAPS: 300.0000 mg | ORAL_CAPSULE | Freq: Two times a day (BID) | ORAL | 1 refills | Status: DC

## 2018-02-09 NOTE — Progress Notes (Signed)
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 Pt on multiple meds for Bipolar disorder and some of them may not be as effective because of rifampin being an inducer- will observe closely. Pt informed.

## 2018-02-12 ENCOUNTER — Telehealth: Payer: Self-pay | Admitting: Infectious Diseases

## 2018-02-12 NOTE — Telephone Encounter (Signed)
Tara ([facility]) nurse just called stating she's drawing labs with patient now and that he may be having some side effects of his new medicine Rifampin 300mg .  Symptoms include: BP is 150/88 Right flank pain that is constant Fluorescent red urine   Pt is stating fills like a lot of urine but isnt when he tries to go  Stating this has started after starting new medicine

## 2018-02-12 NOTE — Telephone Encounter (Signed)
Spoke to patient- he started taking Rifampin Saturday AM-and this morning woke up rt flank pain. He has had renal stones in the past but was on the left side. Told him to hold the rifampin and see. If his pain increases to go to ED. Today his labs have been done- will follow them- will talk to the patient tomorrow

## 2018-02-26 ENCOUNTER — Other Ambulatory Visit: Payer: Self-pay | Admitting: Infectious Diseases

## 2018-02-26 MED ORDER — SULFAMETHOXAZOLE-TRIMETHOPRIM 800-160 MG PO TABS: 1.0000 | ORAL_TABLET | Freq: Two times a day (BID) | ORAL | 1 refills | Status: DC

## 2018-02-26 NOTE — Progress Notes (Unsigned)
Pt has completed Iv cefazolin for Left PJI with MSSA- he will now be on bactrim DS BID and continue Rifampin 300mg  BID

## 2018-05-08 ENCOUNTER — Ambulatory Visit: Payer: Medicaid Other | Admitting: Infectious Diseases

## 2018-05-24 ENCOUNTER — Ambulatory Visit: Payer: Medicaid Other | Admitting: Infectious Diseases

## 2018-06-28 IMAGING — CT CT ABD-PELV W/ CM
2 of 5 series · 15 of 46 positions shown, 17 images · IV contrast (APPLIED)
Comparison: 05/12/2015

CLINICAL DATA: Pt c/o epigastric pain and vomiting x 2 days,
constipation. States he passed a right sided kidney stone last week.
Dx with pericarditis last month, pain similar area.

EXAM:
CT ABDOMEN AND PELVIS WITH CONTRAST
TECHNIQUE: Multidetector CT imaging of the abdomen and pelvis was performed
using the standard protocol following bolus administration of
intravenous contrast.
CONTRAST:  100mL [id number] IOPAMIDOL ([id number]) INJECTION 61%

[Series 2: routine abd/pel with · axial · 0.81mm/px · z∈[-289,+146]mm · 12 of 99 slices shown, 14 images]
[im 6/99  soft-tissue]
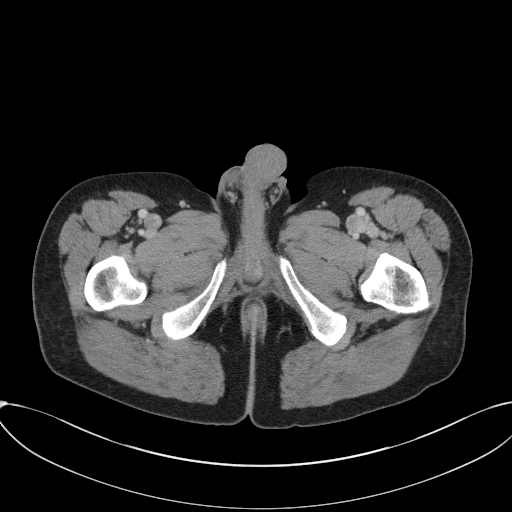
[im 6/99  bone]
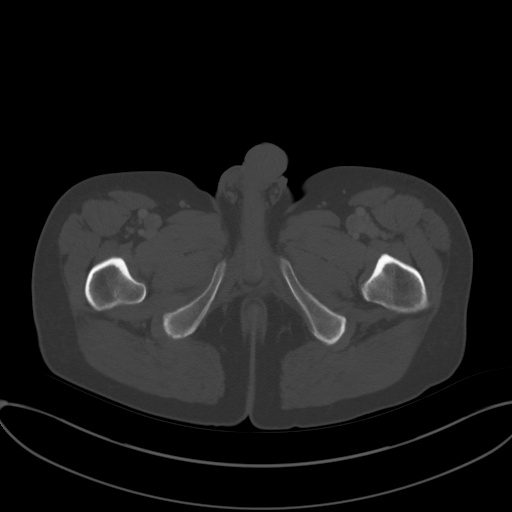
[im 16/99  soft-tissue]
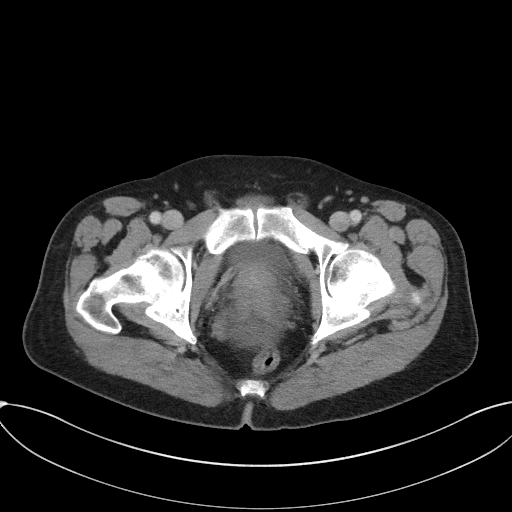
[im 21/99  soft-tissue]
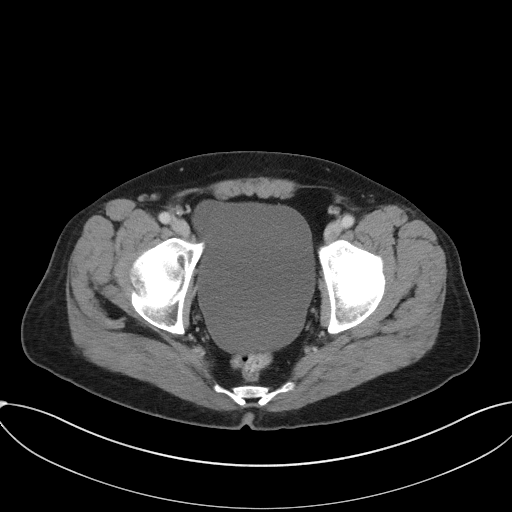
[im 31/99  soft-tissue]
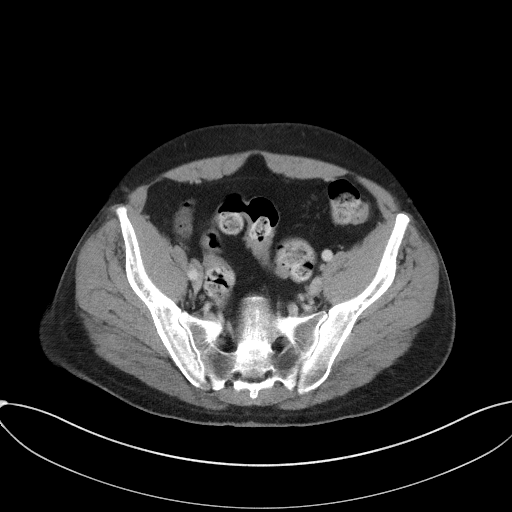
[im 37/99  soft-tissue]
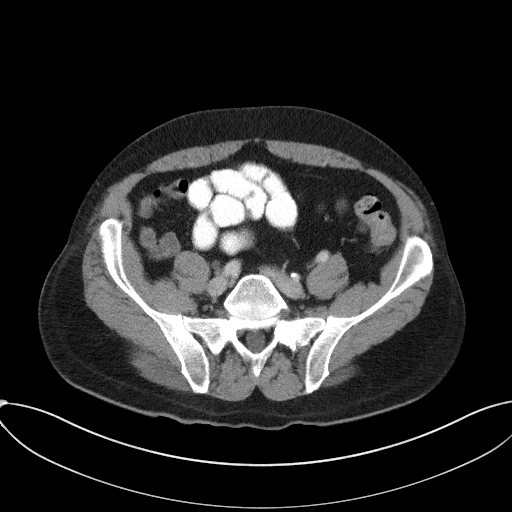
[im 47/99  soft-tissue]
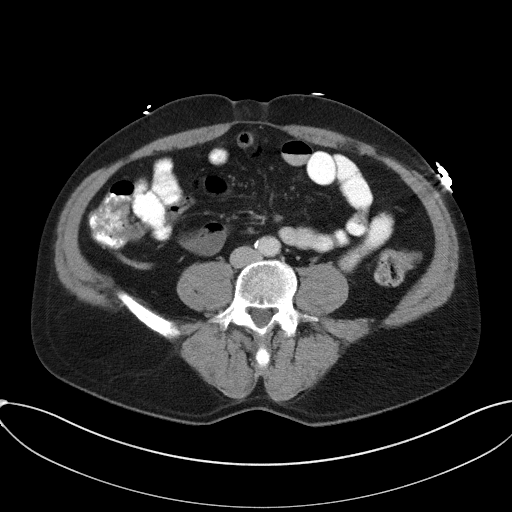
[im 52/99  soft-tissue]
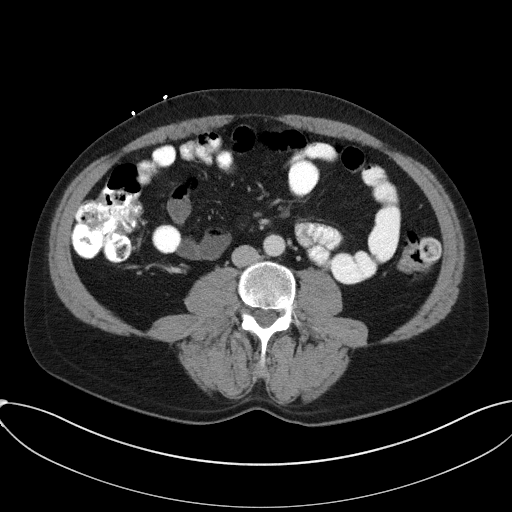
[im 62/99  soft-tissue]
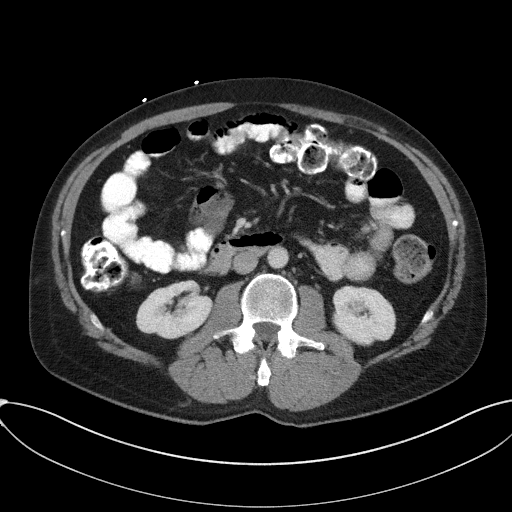
[im 68/99  soft-tissue]
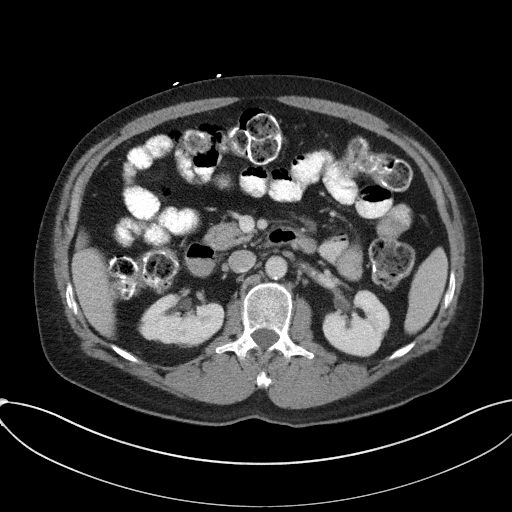
[im 68/99  bone]
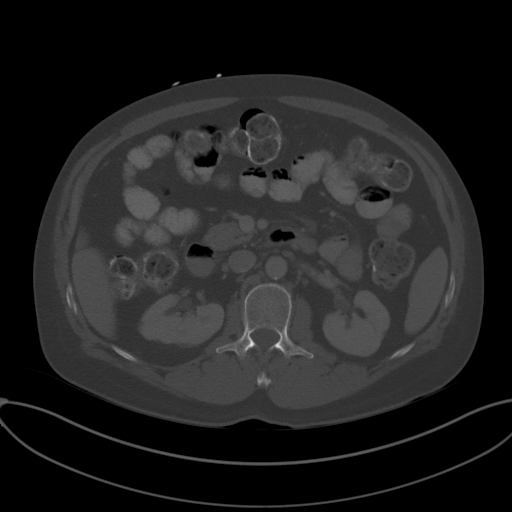
[im 78/99  soft-tissue]
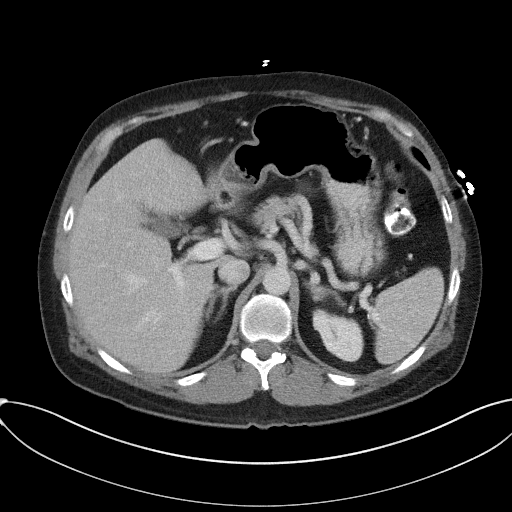
[im 83/99  soft-tissue]
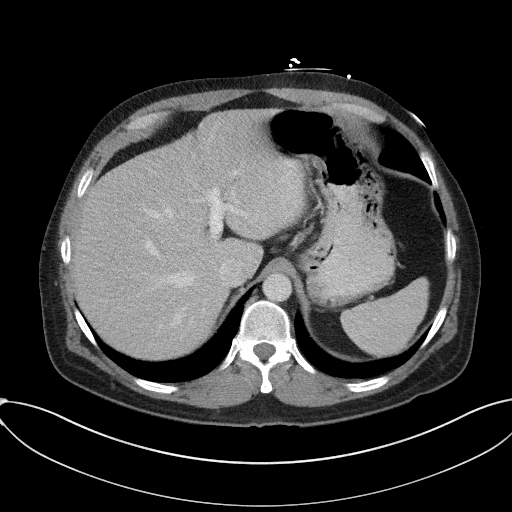
[im 93/99  soft-tissue]
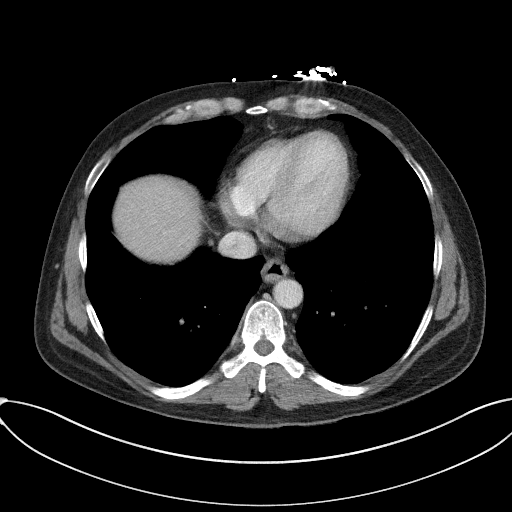

[Series 5: coronal st · coronal · 0.68mm/px · 3 of 93 slices shown]
[im 31/93  soft-tissue]
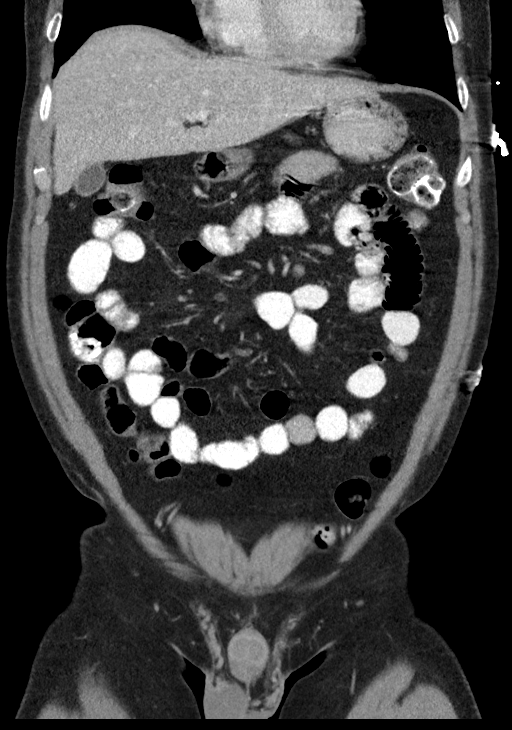
[im 41/93  soft-tissue]
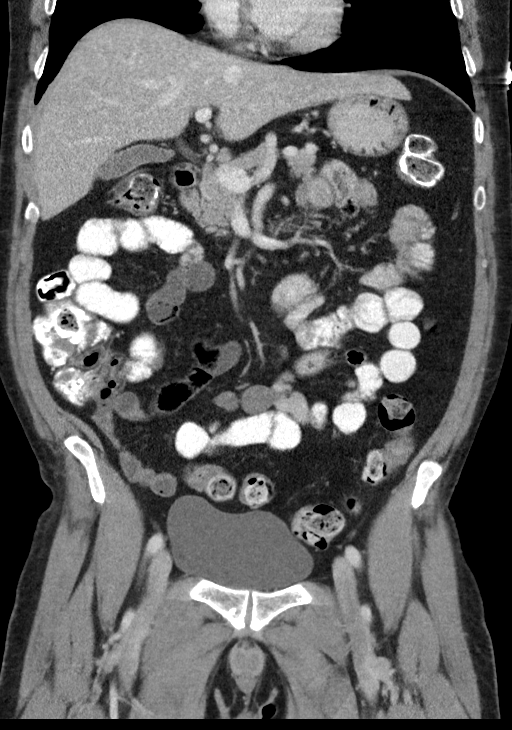
[im 52/93  soft-tissue]
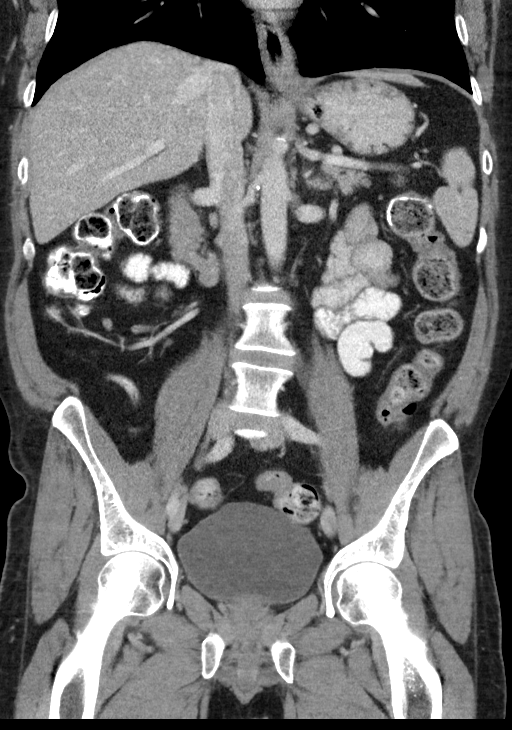

[15 of 46 positions shown; findings below may reference images not displayed]

FINDINGS: Lower chest: Clear lung bases. Heart normal size. No pericardial
effusion.

Hepatobiliary: 6 mm low-density lesion in the right liver lobe
adjacent to the gallbladder, likely a cyst. No other liver masses or
lesions. Normal gallbladder. No bile duct dilation.

Pancreas: Unremarkable. No pancreatic ductal dilatation or
surrounding inflammatory changes.

Spleen: Normal in size without focal abnormality.

Adrenals/Urinary Tract: No adrenal masses. Multiple small
low-density renal lesions consistent with cysts. No stones. No
hydronephrosis. 7 mm lesion from the posterior lower pole the right
kidney shows relative increased attenuation and potentially is
enhancing. This was not present on an abdomen and pelvis CT from
02/01/2007. No other renal masses or lesions. Normal ureters.
Unremarkable bladder.

Stomach/Bowel: Stomach and small bowel are unremarkable. Mild
increased stool in the colon. No colonic wall thickening or
inflammation. Normal appendix visualized.

Vascular/Lymphatic: No significant vascular findings are present. No
enlarged abdominal or pelvic lymph nodes.

Reproductive: Prostate is unremarkable.

Other: Small fat containing umbilical hernia. No other hernias. No
ascites.

Musculoskeletal: No acute or significant osseous findings.
IMPRESSION: 1. No acute findings.
2. No pericardial effusion or CT evidence of pericarditis.
Pericardial fluid seen on the prior CT has resolved.
3. **An incidental finding of potential clinical significance has
been found. ** 7 mm cortical mass arises from the right kidney,
posterior lower pole. This could be a solid mass or potentially a
cyst complicated by hemorrhage or proteinaceous content. It appears
new from a CT dated 02/01/2007. Further evaluation with pre and post
contrast MRI should be considered. Pre and post contrast CT could
alternatively be performed, but would likely be of decreased
accuracy given lesion size.
4. Mild increased stool throughout the colon. No bowel obstruction
or inflammation.

## 2019-01-21 ENCOUNTER — Emergency Department: Payer: Medicaid Other

## 2019-01-21 ENCOUNTER — Other Ambulatory Visit: Payer: Self-pay

## 2019-01-21 ENCOUNTER — Encounter: Payer: Self-pay | Admitting: *Deleted

## 2019-01-21 ENCOUNTER — Inpatient Hospital Stay
Admission: EM | Admit: 2019-01-21 | Discharge: 2019-01-25 | DRG: 200 | Disposition: A | Discharge status: Home / Self Care | Payer: Medicaid Other | Attending: Cardiothoracic Surgery | Admitting: Cardiothoracic Surgery

## 2019-01-21 ENCOUNTER — Observation Stay: Payer: Medicaid Other

## 2019-01-21 DIAGNOSIS — Z791 Long term (current) use of non-steroidal anti-inflammatories (NSAID): Secondary | ICD-10-CM

## 2019-01-21 DIAGNOSIS — F319 Bipolar disorder, unspecified: Secondary | ICD-10-CM | POA: Diagnosis not present

## 2019-01-21 DIAGNOSIS — Z20822 Contact with and (suspected) exposure to covid-19: Secondary | ICD-10-CM | POA: Diagnosis present

## 2019-01-21 DIAGNOSIS — Y92009 Unspecified place in unspecified non-institutional (private) residence as the place of occurrence of the external cause: Secondary | ICD-10-CM

## 2019-01-21 DIAGNOSIS — W03XXXA Other fall on same level due to collision with another person, initial encounter: Secondary | ICD-10-CM | POA: Diagnosis present

## 2019-01-21 DIAGNOSIS — S2241XA Multiple fractures of ribs, right side, initial encounter for closed fracture: Secondary | ICD-10-CM | POA: Diagnosis not present

## 2019-01-21 DIAGNOSIS — Z7982 Long term (current) use of aspirin: Secondary | ICD-10-CM

## 2019-01-21 DIAGNOSIS — F3130 Bipolar disorder, current episode depressed, mild or moderate severity, unspecified: Secondary | ICD-10-CM | POA: Insufficient documentation

## 2019-01-21 DIAGNOSIS — F4310 Post-traumatic stress disorder, unspecified: Secondary | ICD-10-CM | POA: Diagnosis present

## 2019-01-21 DIAGNOSIS — J939 Pneumothorax, unspecified: Secondary | ICD-10-CM

## 2019-01-21 DIAGNOSIS — I2584 Coronary atherosclerosis due to calcified coronary lesion: Secondary | ICD-10-CM

## 2019-01-21 DIAGNOSIS — Z79899 Other long term (current) drug therapy: Secondary | ICD-10-CM

## 2019-01-21 DIAGNOSIS — I10 Essential (primary) hypertension: Secondary | ICD-10-CM | POA: Diagnosis not present

## 2019-01-21 DIAGNOSIS — R140 Abdominal distension (gaseous): Secondary | ICD-10-CM

## 2019-01-21 DIAGNOSIS — Z938 Other artificial opening status: Secondary | ICD-10-CM

## 2019-01-21 DIAGNOSIS — G2581 Restless legs syndrome: Secondary | ICD-10-CM | POA: Diagnosis present

## 2019-01-21 DIAGNOSIS — R7301 Impaired fasting glucose: Secondary | ICD-10-CM | POA: Insufficient documentation

## 2019-01-21 DIAGNOSIS — I2510 Atherosclerotic heart disease of native coronary artery without angina pectoris: Secondary | ICD-10-CM | POA: Diagnosis not present

## 2019-01-21 DIAGNOSIS — K219 Gastro-esophageal reflux disease without esophagitis: Secondary | ICD-10-CM | POA: Diagnosis present

## 2019-01-21 DIAGNOSIS — Y9383 Activity, rough housing and horseplay: Secondary | ICD-10-CM

## 2019-01-21 DIAGNOSIS — Z96653 Presence of artificial knee joint, bilateral: Secondary | ICD-10-CM | POA: Diagnosis present

## 2019-01-21 DIAGNOSIS — S270XXA Traumatic pneumothorax, initial encounter: Principal | ICD-10-CM

## 2019-01-21 DIAGNOSIS — Z87891 Personal history of nicotine dependence: Secondary | ICD-10-CM

## 2019-01-21 LAB — CBC WITH DIFFERENTIAL/PLATELET
Abs Immature Granulocytes: 0.08 10*3/uL — ABNORMAL HIGH (ref 0.00–0.07)
Basophils Absolute: 0.1 10*3/uL (ref 0.0–0.1)
Basophils Relative: 0 %
Eosinophils Absolute: 0 10*3/uL (ref 0.0–0.5)
Eosinophils Relative: 0 %
HCT: 46.9 % (ref 39.0–52.0)
Hemoglobin: 16 g/dL (ref 13.0–17.0)
Immature Granulocytes: 1 %
Lymphocytes Relative: 6 %
Lymphs Abs: 0.9 10*3/uL (ref 0.7–4.0)
MCH: 30.7 pg (ref 26.0–34.0)
MCHC: 34.1 g/dL (ref 30.0–36.0)
MCV: 90 fL (ref 80.0–100.0)
Monocytes Absolute: 0.8 10*3/uL (ref 0.1–1.0)
Monocytes Relative: 6 %
Neutro Abs: 13.1 10*3/uL — ABNORMAL HIGH (ref 1.7–7.7)
Neutrophils Relative %: 87 %
Platelets: 300 10*3/uL (ref 150–400)
RBC: 5.21 MIL/uL (ref 4.22–5.81)
RDW: 14.2 % (ref 11.5–15.5)
WBC: 15 10*3/uL — ABNORMAL HIGH (ref 4.0–10.5)
nRBC: 0 % (ref 0.0–0.2)

## 2019-01-21 LAB — TROPONIN I (HIGH SENSITIVITY): Troponin I (High Sensitivity): 10 ng/L (ref <18)

## 2019-01-21 LAB — CBC
HCT: 45.3 % (ref 39.0–52.0)
Hemoglobin: 15.2 g/dL (ref 13.0–17.0)
MCH: 30.6 pg (ref 26.0–34.0)
MCHC: 33.6 g/dL (ref 30.0–36.0)
MCV: 91.3 fL (ref 80.0–100.0)
Platelets: 278 10*3/uL (ref 150–400)
RBC: 4.96 MIL/uL (ref 4.22–5.81)
RDW: 14.3 % (ref 11.5–15.5)
WBC: 16.4 10*3/uL — ABNORMAL HIGH (ref 4.0–10.5)
nRBC: 0 % (ref 0.0–0.2)

## 2019-01-21 LAB — BASIC METABOLIC PANEL
Anion gap: 16 — ABNORMAL HIGH (ref 5–15)
BUN: 12 mg/dL (ref 6–20)
CO2: 24 mmol/L (ref 22–32)
Calcium: 10.5 mg/dL — ABNORMAL HIGH (ref 8.9–10.3)
Chloride: 100 mmol/L (ref 98–111)
Creatinine, Ser: 1.09 mg/dL (ref 0.61–1.24)
GFR calc Af Amer: >60 mL/min (ref >60)
GFR calc non Af Amer: >60 mL/min (ref >60)
Glucose, Bld: 144 mg/dL — ABNORMAL HIGH (ref 70–99)
Potassium: 4.6 mmol/L (ref 3.5–5.1)
Sodium: 140 mmol/L (ref 135–145)

## 2019-01-21 LAB — CREATININE, SERUM
Creatinine, Ser: 1 mg/dL (ref 0.61–1.24)
GFR calc Af Amer: >60 mL/min (ref >60)
GFR calc non Af Amer: >60 mL/min (ref >60)

## 2019-01-21 LAB — BRAIN NATRIURETIC PEPTIDE: B Natriuretic Peptide: 130 pg/mL — ABNORMAL HIGH (ref 0.0–100.0)

## 2019-01-21 LAB — RESPIRATORY PANEL BY RT PCR (FLU A&B, COVID)
Influenza A by PCR: NEGATIVE
Influenza B by PCR: NEGATIVE
SARS Coronavirus 2 by RT PCR: NEGATIVE

## 2019-01-21 LAB — ETHANOL: Alcohol, Ethyl (B): <10 mg/dL (ref <10)

## 2019-01-21 MED ORDER — QUETIAPINE FUMARATE 25 MG PO TABS: 50.0000 mg | ORAL_TABLET | Freq: Four times a day (QID) | ORAL | Status: DC | PRN

## 2019-01-21 MED ORDER — IOHEXOL 300 MG/ML  SOLN
75.0000 mL | Freq: Once | INTRAMUSCULAR | Status: AC | PRN
  Administered 2019-01-21: 75 mL via INTRAVENOUS

## 2019-01-21 MED ORDER — ACETAMINOPHEN 500 MG PO TABS
1000.0000 mg | ORAL_TABLET | Freq: Four times a day (QID) | ORAL | Status: DC
  Administered 2019-01-21 – 2019-01-22 (×3): 1000 mg via ORAL
  Filled 2019-01-21 (×3): qty 2

## 2019-01-21 MED ORDER — FLUOXETINE HCL 20 MG PO CAPS
80.0000 mg | ORAL_CAPSULE | Freq: Every day | ORAL | Status: DC
  Administered 2019-01-22 – 2019-01-25 (×4): 80 mg via ORAL
  Filled 2019-01-21 (×4): qty 4

## 2019-01-21 MED ORDER — ENOXAPARIN SODIUM 40 MG/0.4ML ~~LOC~~ SOLN
40.0000 mg | SUBCUTANEOUS | Status: DC
  Administered 2019-01-21: 40 mg via SUBCUTANEOUS
  Filled 2019-01-21: qty 0.4

## 2019-01-21 MED ORDER — GABAPENTIN 400 MG PO CAPS: 400.0000 mg | ORAL_CAPSULE | Freq: Two times a day (BID) | ORAL | Status: DC

## 2019-01-21 MED ORDER — ENALAPRIL MALEATE 10 MG PO TABS
10.0000 mg | ORAL_TABLET | Freq: Every day | ORAL | Status: DC
  Administered 2019-01-21 – 2019-01-25 (×5): 10 mg via ORAL
  Filled 2019-01-21 (×5): qty 1

## 2019-01-21 MED ORDER — ASPIRIN EC 81 MG PO TBEC: 81.0000 mg | DELAYED_RELEASE_TABLET | Freq: Every day | ORAL | Status: DC

## 2019-01-21 MED ORDER — LABETALOL HCL 100 MG PO TABS
100.0000 mg | ORAL_TABLET | Freq: Two times a day (BID) | ORAL | Status: DC
  Administered 2019-01-21 – 2019-01-25 (×8): 100 mg via ORAL
  Filled 2019-01-21 (×9): qty 1

## 2019-01-21 MED ORDER — METHOCARBAMOL 500 MG PO TABS
500.0000 mg | ORAL_TABLET | Freq: Four times a day (QID) | ORAL | Status: DC | PRN
  Administered 2019-01-21 – 2019-01-23 (×3): 500 mg via ORAL
  Filled 2019-01-21 (×5): qty 1

## 2019-01-21 MED ORDER — OXYCODONE HCL 5 MG PO TABS
5.0000 mg | ORAL_TABLET | Freq: Once | ORAL | Status: AC
  Administered 2019-01-21: 5 mg via ORAL
  Filled 2019-01-21: qty 1

## 2019-01-21 MED ORDER — QUETIAPINE FUMARATE ER 200 MG PO TB24
400.0000 mg | ORAL_TABLET | Freq: Every day | ORAL | Status: DC
  Administered 2019-01-21 – 2019-01-24 (×4): 400 mg via ORAL
  Filled 2019-01-21 (×5): qty 2

## 2019-01-21 MED ORDER — AMLODIPINE BESYLATE 5 MG PO TABS
5.0000 mg | ORAL_TABLET | Freq: Every day | ORAL | Status: DC
  Administered 2019-01-21 – 2019-01-25 (×5): 5 mg via ORAL
  Filled 2019-01-21 (×5): qty 1

## 2019-01-21 MED ORDER — OXYCODONE HCL 5 MG PO TABS
5.0000 mg | ORAL_TABLET | ORAL | Status: DC | PRN
  Administered 2019-01-21: 5 mg via ORAL
  Administered 2019-01-21 – 2019-01-22 (×2): 10 mg via ORAL
  Filled 2019-01-21 (×2): qty 2
  Filled 2019-01-21: qty 1

## 2019-01-21 MED ORDER — HYDROMORPHONE HCL 1 MG/ML IJ SOLN
1.0000 mg | Freq: Once | INTRAMUSCULAR | Status: AC
  Administered 2019-01-21: 1 mg via INTRAVENOUS
  Filled 2019-01-21: qty 1

## 2019-01-21 MED ORDER — LORAZEPAM 2 MG/ML IJ SOLN
0.5000 mg | Freq: Once | INTRAMUSCULAR | Status: AC
  Administered 2019-01-21: 0.5 mg via INTRAVENOUS
  Filled 2019-01-21: qty 1

## 2019-01-21 MED ORDER — LABETALOL HCL 200 MG PO TABS
200.0000 mg | ORAL_TABLET | Freq: Once | ORAL | Status: AC
  Administered 2019-01-21: 200 mg via ORAL
  Filled 2019-01-21: qty 1

## 2019-01-21 MED ORDER — ONDANSETRON HCL 4 MG/2ML IJ SOLN
4.0000 mg | Freq: Four times a day (QID) | INTRAMUSCULAR | Status: DC | PRN
  Administered 2019-01-21: 4 mg via INTRAVENOUS
  Filled 2019-01-21: qty 2

## 2019-01-21 MED ORDER — ONDANSETRON 4 MG PO TBDP: 4.0000 mg | ORAL_TABLET | Freq: Four times a day (QID) | ORAL | Status: DC | PRN

## 2019-01-21 MED ORDER — ZOLPIDEM TARTRATE 5 MG PO TABS: 10.0000 mg | ORAL_TABLET | Freq: Every evening | ORAL | Status: DC | PRN

## 2019-01-21 MED ORDER — METHOCARBAMOL 1000 MG/10ML IJ SOLN
500.0000 mg | Freq: Once | INTRAVENOUS | Status: AC
  Administered 2019-01-21: 500 mg via INTRAVENOUS
  Filled 2019-01-21: qty 5

## 2019-01-21 MED ORDER — DIVALPROEX SODIUM 500 MG PO DR TAB
500.0000 mg | DELAYED_RELEASE_TABLET | Freq: Two times a day (BID) | ORAL | Status: DC
  Administered 2019-01-21 – 2019-01-25 (×8): 500 mg via ORAL
  Filled 2019-01-21 (×9): qty 1

## 2019-01-21 NOTE — ED Notes (Signed)
Patient appears more comfortable after the dilaudid. RR 18. Less groaning with expirations.

## 2019-01-21 NOTE — ED Notes (Signed)
Dr Williams at bedside 
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
## 2019-01-21 NOTE — ED Notes (Signed)
Patient switched to 4L [place]

## 2019-01-21 NOTE — ED Notes (Signed)
Report given to Rebekah RN/Kate B RN
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
## 2019-01-21 NOTE — ED Notes (Signed)
Patient is in pain 8/10, hurts when he breathes.  Crackers and ginger ale provided per pt request.  Toileting offered, bed in lowest position, call light within reach.  Will continue to monitor.

## 2019-01-21 NOTE — ED Notes (Signed)
Report to include Situation, Background, Assessment, and Recommendations received from [facility]. Patient alert and oriented, warm and dry, in pain 9/10.  Toileting was offered, urinal made available.  Bed is locked in lowest position, call light within reach, lights dimmed for pt comfort.

## 2019-01-21 NOTE — ED Notes (Signed)
Patient was delayed going to CT due to increased pain and shortness of breath. X-ray at bedside.
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
## 2019-01-21 NOTE — ED Notes (Signed)
Patient taken to CT scan by this writer.
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
## 2019-01-21 NOTE — ED Notes (Signed)
IV team at bedside 

## 2019-01-21 NOTE — ED Notes (Signed)
Patient is back from CT scan. Dr. Mayford Knife is aware. Patient is lying on stretcher with head raised approximately 30 degrees per his request. Patient states his pain has improved and his shortness of breath.

## 2019-01-21 NOTE — Consult Note (Deleted)
[facility] SURGICAL CONSULTATION NOTE (initial) - cpt: 11941   HISTORY OF PRESENT ILLNESS (HPI):  59 y.o. male presented to [facility] ED today for evaluation of chest pain and shortness of breath. Patient reports that he was "horsing around" with his brother yesterday when he fell from standing and struck a wooden surface on his left lateral chest. Since that time, he has had significant pain to his left lateral chest wall and shortness of breath. Pain is exacerbated with deep inspiration. Laying flat makes the pain better. No fever, chills, cough, congestion, or any additional traumatic injuries. He does have a 20 ppy smoking history which he quit. Work up in the ED was concerning for hypoxia on room air at presentation, 3-5th right sided rib fractures, and small apical pneumothorax on CXR and CT.    Surgery is consulted by emergency medicine physician Dr. Cherene Julian, MD in this context for evaluation and management of right pneumothorax.  PAST MEDICAL HISTORY (PMH):  Past Medical History:  Diagnosis Date  . Anxiety   . Arthritis    knees and hands  . Bipolar disorder (HCC)   . Depression   . GERD (gastroesophageal reflux disease)   . Hepatitis    HEP "C"  . History of kidney stones   . Hypertension   . 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 Kidney stones   . Pericarditis 05/2015   a. echo 5/17: EF 60-65%, no RWMA, LV dias fxn nl, LA mildly dilated, RV sys fxn nl, PASP nl, moderate sized circumferential pericardial effusion was identified, 2.12 cm around the LV free wall, <1 cm around the RV free wall. Features were not c/w tamponade physiology  . PTSD (post-traumatic stress disorder)    Witnessed brother's suicide.  Marland Kitchen Restless leg syndrome   . Syncope      PAST SURGICAL HISTORY [facility]):  Past Surgical History:  Procedure Laterality Date  . CYSTOSCOPY WITH URETEROSCOPY AND STENT PLACEMENT    . ESOPHAGOGASTRODUODENOSCOPY N/A 01/11/2016   Procedure: ESOPHAGOGASTRODUODENOSCOPY (EGD);  Surgeon: Charlott Rakes, MD;  Location: [facility] ENDOSCOPY;  Service: Endoscopy;  Laterality: N/A;  . INCISION AND DRAINAGE ABSCESS Left 01/02/2018   Procedure: INCISION AND DRAINAGE LEFT KNEE;  Surgeon: Deeann Saint, MD;  Location: [facility] ORS;  Service: Orthopedics;  Laterality: Left;  . JOINT REPLACEMENT Right    TKR  . KNEE ARTHROSCOPY Right 06/25/2014   Procedure: ARTHROSCOPY KNEE;  Surgeon: Deeann Saint, MD;  Location: [facility] ORS;  Service: Orthopedics;  Laterality: Right;  partial arthroscopic medial menisectomy  . TOTAL KNEE ARTHROPLASTY Right 04/22/2015   Procedure: TOTAL KNEE ARTHROPLASTY;  Surgeon: Deeann Saint, MD;  Location: [facility] ORS;  Service: Orthopedics;  Laterality: Right;  . TOTAL KNEE ARTHROPLASTY Left 10/30/2017   Procedure: TOTAL KNEE ARTHROPLASTY;  Surgeon: Deeann Saint, MD;  Location: [facility] ORS;  Service: Orthopedics;  Laterality: Left;  . TOTAL KNEE REVISION Left 01/02/2018   Procedure: poly exchange of tibia and patella left knee;  Surgeon: Deeann Saint, MD;  Location: [facility] ORS;  Service: Orthopedics;  Laterality: Left;     MEDICATIONS:  Prior to Admission medications   Medication Sig Start Date End Date Taking? Authorizing Provider  aspirin EC 325 MG tablet Take 1 tablet (325 mg total) by mouth daily. 11/03/17   Deeann Saint, MD  divalproex (DEPAKOTE) 500 MG DR tablet Take 500 mg by mouth 2 (two) times daily.    [provider]  enalapril (VASOTEC) 10 MG tablet Take 10 mg by mouth  daily.    [provider]  FLUoxetine (PROZAC) 20 MG capsule Take 80 mg by mouth daily.     [provider]  gabapentin (NEURONTIN) 400 MG capsule Take 1 capsule (400 mg total) by mouth 2 (two) times daily. 11/03/17   Deeann Saint, MD  HYDROcodone-acetaminophen (NORCO/VICODIN) 5-325 MG tablet Take 1-2 tablets by mouth every 6 (six) hours as needed for moderate pain. Patient not taking: Reported on 02/06/2018 01/04/18    Enedina Finner, MD  meloxicam (MOBIC) 15 MG tablet Take 1 tablet (15 mg total) by mouth daily. 11/03/17   Deeann Saint, MD  QUEtiapine (SEROQUEL) 50 MG tablet Take 50 mg by mouth 4 (four) times daily as needed (for agitation).     [provider]  REXULTI 2 MG TABS TK 1 T PO HS 11/22/17   [provider]  rifampin (RIFADIN) 300 MG capsule Take 1 capsule (300 mg total) by mouth 2 (two) times daily. 02/09/18   Lynn Ito, MD  SEROQUEL XR 400 MG 24 hr tablet Take 400 mg by mouth at bedtime.     [provider]  sulfamethoxazole-trimethoprim (BACTRIM DS,SEPTRA DS) 800-160 MG tablet Take 1 tablet by mouth 2 (two) times daily. 02/26/18   Lynn Ito, MD  zolpidem (AMBIEN) 10 MG tablet Take 10 mg by mouth at bedtime as needed for sleep.    [provider]     ALLERGIES:  Allergies  Allergen Reactions  . Toradol [Ketorolac Tromethamine] Hives     SOCIAL HISTORY:  Social History   Socioeconomic History  . Marital status: Single    Spouse name: Not on file  . Number of children: Not on file  . Years of education: Not on file  . Highest education level: Not on file  Occupational History  . Not on file  Tobacco Use  . Smoking status: Former Smoker    Packs/day: 0.75    Years: 20.00    Pack years: 15.00    Types: Cigarettes    Quit date: 05/16/1984    Years since quitting: 34.7  . Smokeless tobacco: Never Used  Substance and Sexual Activity  . Alcohol use: Yes    Comment: occ  . Drug use: No  . Sexual activity: Not on file  Other Topics Concern  . Not on file  Social History Narrative  . Not on file   Social Determinants of Health   Financial Resource Strain:   . Difficulty of Paying Living Expenses: Not on file  Food Insecurity:   . Worried About Programme researcher, broadcasting/film/video in the Last Year: Not on file  . Ran Out of Food in the Last Year: Not on file  Transportation Needs:   . Lack of Transportation (Medical): Not on file  .  Lack of Transportation (Non-Medical): Not on file  Physical Activity:   . Days of Exercise per Week: Not on file  . Minutes of Exercise per Session: Not on file  Stress:   . Feeling of Stress : Not on file  Social Connections:   . Frequency of Communication with Friends and Family: Not on file  . Frequency of Social Gatherings with Friends and Family: Not on file  . Attends Religious Services: Not on file  . Active Member of Clubs or Organizations: Not on file  . Attends Banker Meetings: Not on file  . Marital Status: Not on file  Intimate Partner Violence:   . Fear of Current or Ex-Partner: Not on file  .  Emotionally Abused: Not on file  . Physically Abused: Not on file  . Sexually Abused: Not on file     FAMILY HISTORY:  Family History  Problem Relation Age of Onset  . CVA Mother        deceased at age 7  . Depression Brother        Died by suicide at age 56      REVIEW OF SYSTEMS:  Review of Systems  Constitutional: Negative for chills and fever.  HENT: Negative for congestion and sore throat.   Respiratory: Positive for shortness of breath. Negative for cough.   Cardiovascular: Positive for chest pain (MSK). Negative for palpitations.  Gastrointestinal: Negative for abdominal pain, nausea and vomiting.  Musculoskeletal: Positive for falls. Negative for neck pain.  All other systems reviewed and are negative.   VITAL SIGNS:  Pulse Rate:  [100-135] 135 (01/11 1455) Resp:  [18-33] 22 (01/11 1455) BP: (173-200)/(122-160) 182/133 (01/11 1455) SpO2:  [96 %-100 %] 97 % (01/11 1455) Weight:  [90.7 kg] 90.7 kg (01/11 1311)     Height: 5\' 9"  (175.3 cm) Weight: 90.7 kg BMI (Calculated): 29.52   INTAKE/OUTPUT:  No intake/output data recorded.  PHYSICAL EXAM:  Physical Exam Vitals and nursing note reviewed.  Constitutional:      General: He is in acute distress.     Appearance: He is well-developed and normal weight. He is not ill-appearing.  HENT:      Head: Normocephalic and atraumatic.  Eyes:     Extraocular Movements: Extraocular movements intact.     Pupils: Pupils are equal, round, and reactive to light.  Cardiovascular:     Rate and Rhythm: Regular rhythm. Tachycardia present.     Pulses: Normal pulses.  Pulmonary:     Effort: Tachypnea present.     Breath sounds: Examination of the left-upper field reveals decreased breath sounds. Decreased breath sounds present.     Comments: Patient taking short shallow breaths, conversational dyspnea, on [place] Abdominal:     General: Abdomen is flat. There is no distension.     Palpations: Abdomen is soft.     Tenderness: There is no abdominal tenderness. There is no guarding or rebound.     Hernia: A hernia is present. Hernia is present in the umbilical area.  Musculoskeletal:     Right lower leg: No tenderness.     Left lower leg: No tenderness.  Skin:    General: Skin is warm and dry.  Neurological:     General: No focal deficit present.     Mental Status: He is alert and oriented to person, place, and time.  Psychiatric:        Mood and Affect: Mood normal.        Behavior: Behavior normal.      Labs:  CBC Latest Ref Rng & Units 01/21/2019 01/04/2018 12/30/2017  WBC 4.0 - 10.5 K/uL 15.0(H) 7.7 6.7  Hemoglobin 13.0 - 17.0 g/dL 16.0 8.4(L) 10.7(L)  Hematocrit 39.0 - 52.0 % 46.9 25.7(L) 32.9(L)  Platelets 150 - 400 K/uL 300 253 236   CMP Latest Ref Rng & Units 01/21/2019 01/04/2018 01/02/2018  Glucose 70 - 99 mg/dL 144(H) 103(H) -  BUN 6 - 20 mg/dL 12 8 -  Creatinine 0.61 - 1.24 mg/dL 1.09 0.69 0.79  Sodium 135 - 145 mmol/L 140 136 -  Potassium 3.5 - 5.1 mmol/L 4.6 3.2(L) -  Chloride 98 - 111 mmol/L 100 102 -  CO2 22 - 32 mmol/L 24 26 -  Calcium 8.9 - 10.3 mg/dL 10.5(H) 8.6(L) -  Total Protein 6.5 - 8.1 g/dL - 6.3(L) -  Total Bilirubin 0.3 - 1.2 mg/dL - 0.4 -  Alkaline Phos 38 - 126 U/L - 83 -  AST 15 - 41 U/L - 10(L) -  ALT 0 - 44 U/L - 7 -     Imaging studies:   CT  Chest (01/21/2019) personally reviewed showing right sided pneumothorax with right rib fractures, and radiologist interpretation reviewed:  IMPRESSION: 1. Nondisplaced fractures of the right anterior 3-5 ribs with a small right pneumothorax. 2. Bibasilar subpleural linear atelectasis/scarring. Atypical infection is not excluded. Clinical correlation is recommended. 3. Three vessel coronary vascular calcification.  CXR (01/21/2019) personally reviewed and agree with radiologist report below:  IMPRESSION: 1. Increasing small right apical pneumothorax.   Assessment/Plan: (ICD-10's: J93.9) 59 y.o. male with right sided rib pain and shortness of breath attributable to right sided rib fractures and small pneumothorax after fall from standing, complicated by multiple pertinent comorbidities   - Recommend admission to medicine given comorbid disease; we will follow in consult  - Reasonable to hold off on chest tube placement for now; monitor  - Repeat CXR in the morning or if any concern for clinical deterioration   - pain control prn  - Aggressive pulmonary toilet   - Further management per primary service   All of the above findings and recommendations were discussed with the patient, and all of patient's questions were answered to his expressed satisfaction.  Thank you for the opportunity to participate in this patient's care.   -- Lynden Oxford, PA-C [facility] 01/21/2019, 3:42 PM [phone] M-F: 7am - 4pm

## 2019-01-21 NOTE — ED Notes (Signed)
Patient c/o return of chest pain and increased shortness of breath. Patient is pale and diaphoretic, but can speak in complete sentences.

## 2019-01-21 NOTE — H&P (Signed)
  I have seen and examined this patient with [name].  I have read and agree with his history and physical as he has dictated it.  [name] is a 59 year old gentleman who was involved in some physical activities last evening where he will was tackled and fell on his right arm.  He states that he immediately developed some severe chest pain and this was associated with some shortness of breath.  He immediately went to bed and laid in bed most of the night and ultimately today came to the emergency room.  Upon arrival here her chest x-ray showed a small apical pneumothorax.  A CT scan confirmed the presence of rib fractures at 3 4 and 5 associated with a small pneumothorax.  The patient has remained clinically stable.  He is not short of breath at this time and states that the pain medication have significantly improved his overall pain and his shortness of breath.  His O2 sats are in the mid 90s.  He is wearing oxygen.  He also has a mask on.  On exam his lungs show slightly diminished breath sounds anteriorly on the right.  There is no obvious chest wall ecchymotic areas.  His heart is regular.  He is awake alert and appropriate.  I have independently reviewed his chest x-ray and CT scan.  I agree that we should admit the patient to the hospital and place him under observation.  We will continue his pain management as well as his oxygen therapy.  We will repeat chest x-ray later tonight.  We will also repeat the chest x-ray tomorrow.   All of his questions were answered.  He is in agreement with the above.

## 2019-01-22 ENCOUNTER — Observation Stay: Payer: Medicaid Other

## 2019-01-22 ENCOUNTER — Other Ambulatory Visit: Payer: Self-pay

## 2019-01-22 DIAGNOSIS — F319 Bipolar disorder, unspecified: Secondary | ICD-10-CM | POA: Diagnosis present

## 2019-01-22 DIAGNOSIS — S270XXA Traumatic pneumothorax, initial encounter: Secondary | ICD-10-CM | POA: Diagnosis present

## 2019-01-22 DIAGNOSIS — Z7982 Long term (current) use of aspirin: Secondary | ICD-10-CM | POA: Diagnosis not present

## 2019-01-22 DIAGNOSIS — Y9383 Activity, rough housing and horseplay: Secondary | ICD-10-CM | POA: Diagnosis not present

## 2019-01-22 DIAGNOSIS — Y92009 Unspecified place in unspecified non-institutional (private) residence as the place of occurrence of the external cause: Secondary | ICD-10-CM | POA: Diagnosis not present

## 2019-01-22 DIAGNOSIS — F4310 Post-traumatic stress disorder, unspecified: Secondary | ICD-10-CM | POA: Diagnosis present

## 2019-01-22 DIAGNOSIS — Z79899 Other long term (current) drug therapy: Secondary | ICD-10-CM | POA: Diagnosis not present

## 2019-01-22 DIAGNOSIS — I10 Essential (primary) hypertension: Secondary | ICD-10-CM | POA: Diagnosis not present

## 2019-01-22 DIAGNOSIS — Z791 Long term (current) use of non-steroidal anti-inflammatories (NSAID): Secondary | ICD-10-CM | POA: Diagnosis not present

## 2019-01-22 DIAGNOSIS — K219 Gastro-esophageal reflux disease without esophagitis: Secondary | ICD-10-CM | POA: Diagnosis present

## 2019-01-22 DIAGNOSIS — Z87891 Personal history of nicotine dependence: Secondary | ICD-10-CM | POA: Diagnosis not present

## 2019-01-22 DIAGNOSIS — G2581 Restless legs syndrome: Secondary | ICD-10-CM | POA: Diagnosis present

## 2019-01-22 DIAGNOSIS — S2241XA Multiple fractures of ribs, right side, initial encounter for closed fracture: Secondary | ICD-10-CM | POA: Diagnosis not present

## 2019-01-22 DIAGNOSIS — W03XXXA Other fall on same level due to collision with another person, initial encounter: Secondary | ICD-10-CM | POA: Diagnosis present

## 2019-01-22 DIAGNOSIS — Z20822 Contact with and (suspected) exposure to covid-19: Secondary | ICD-10-CM | POA: Diagnosis present

## 2019-01-22 DIAGNOSIS — Z96653 Presence of artificial knee joint, bilateral: Secondary | ICD-10-CM | POA: Diagnosis present

## 2019-01-22 DIAGNOSIS — R0602 Shortness of breath: Secondary | ICD-10-CM | POA: Diagnosis present

## 2019-01-22 DIAGNOSIS — J939 Pneumothorax, unspecified: Secondary | ICD-10-CM | POA: Diagnosis not present

## 2019-01-22 LAB — TSH: TSH: 5.967 u[IU]/mL — ABNORMAL HIGH (ref 0.350–4.500)

## 2019-01-22 LAB — HEMOGLOBIN A1C
Hgb A1c MFr Bld: 5.7 % — ABNORMAL HIGH (ref 4.8–5.6)
Mean Plasma Glucose: 116.89 mg/dL

## 2019-01-22 LAB — T4, FREE: Free T4: 0.81 ng/dL (ref 0.61–1.12)

## 2019-01-22 LAB — HIV ANTIBODY (ROUTINE TESTING W REFLEX): HIV Screen 4th Generation wRfx: NONREACTIVE

## 2019-01-22 MED ORDER — FENTANYL CITRATE (PF) 100 MCG/2ML IJ SOLN
INTRAMUSCULAR | Status: AC
  Filled 2019-01-22: qty 2

## 2019-01-22 MED ORDER — FENTANYL CITRATE (PF) 100 MCG/2ML IJ SOLN
INTRAMUSCULAR | Status: AC | PRN
  Administered 2019-01-22 (×3): 25 ug via INTRAVENOUS

## 2019-01-22 MED ORDER — TRAMADOL HCL 50 MG PO TABS
100.0000 mg | ORAL_TABLET | Freq: Four times a day (QID) | ORAL | Status: DC
  Administered 2019-01-22 – 2019-01-25 (×11): 100 mg via ORAL
  Filled 2019-01-22 (×11): qty 2

## 2019-01-22 MED ORDER — LABETALOL HCL 5 MG/ML IV SOLN
5.0000 mg | Freq: Once | INTRAVENOUS | Status: AC
  Administered 2019-01-22: 5 mg via INTRAVENOUS
  Filled 2019-01-22: qty 4

## 2019-01-22 MED ORDER — HYDROMORPHONE HCL 1 MG/ML IJ SOLN
1.0000 mg | Freq: Once | INTRAMUSCULAR | Status: AC
  Administered 2019-01-22: 1 mg via INTRAVENOUS
  Filled 2019-01-22: qty 1

## 2019-01-22 MED ORDER — MIDAZOLAM HCL 2 MG/2ML IJ SOLN
INTRAMUSCULAR | Status: AC | PRN
  Administered 2019-01-22: 1 mg via INTRAVENOUS

## 2019-01-22 MED ORDER — ENOXAPARIN SODIUM 40 MG/0.4ML ~~LOC~~ SOLN
40.0000 mg | SUBCUTANEOUS | Status: DC
  Administered 2019-01-22 – 2019-01-24 (×3): 40 mg via SUBCUTANEOUS
  Filled 2019-01-22 (×3): qty 0.4

## 2019-01-22 MED ORDER — SODIUM CHLORIDE 0.9% FLUSH: 5.0000 mL | Freq: Three times a day (TID) | INTRAVENOUS | Status: DC

## 2019-01-22 MED ORDER — HYDROCODONE-ACETAMINOPHEN 5-325 MG PO TABS
2.0000 | ORAL_TABLET | ORAL | Status: DC | PRN
  Administered 2019-01-22 (×2): 2 via ORAL
  Filled 2019-01-22 (×2): qty 2

## 2019-01-22 MED ORDER — MIDAZOLAM HCL 5 MG/5ML IJ SOLN
INTRAMUSCULAR | Status: AC
  Filled 2019-01-22: qty 5

## 2019-01-22 NOTE — Progress Notes (Signed)
Patient clinically stable post CT Placement per Dr Register, tolerated well. Vitals stable throughout. Remains on 4l/min oxygen from pre procedure. Resting at intervals. Received Versed 2mg  along with Fentanyl IV for procedure. CT dressing dry and intact to right outer chest, to low wall suction. Report called to on 2C with questions answered.

## 2019-01-22 NOTE — Progress Notes (Signed)
[place] at [facility]   PATIENT NAME: [name]    MR#:  [id number]  DATE OF BIRTH:  25-Apr-1960  SUBJECTIVE:  CHIEF COMPLAINT:   Chief Complaint  Patient presents with  . Shortness of Breath  sob and pain slowly improving REVIEW OF SYSTEMS:  Review of Systems  Constitutional: Negative for diaphoresis, fever, malaise/fatigue and weight loss.  HENT: Negative for ear discharge, ear pain, hearing loss, nosebleeds, sore throat and tinnitus.   Eyes: Negative for blurred vision and pain.  Respiratory: Positive for shortness of breath. Negative for cough, hemoptysis and wheezing.   Cardiovascular: Positive for chest pain. Negative for palpitations, orthopnea and leg swelling.  Gastrointestinal: Negative for abdominal pain, blood in stool, constipation, diarrhea, heartburn, nausea and vomiting.  Genitourinary: Negative for dysuria, frequency and urgency.  Musculoskeletal: Negative for back pain and myalgias.  Skin: Negative for itching and rash.  Neurological: Negative for dizziness, tingling, tremors, focal weakness, seizures, weakness and headaches.  Psychiatric/Behavioral: Negative for depression. The patient is not nervous/anxious.     DRUG ALLERGIES:   Allergies  Allergen Reactions  . Toradol [Ketorolac Tromethamine] Hives   VITALS:  Blood pressure 114/71, pulse 89, temperature 98.6 F (37 C), temperature source Oral, resp. rate 20, height 5\' 9"  (1.753 m), weight 89.9 kg, SpO2 95 %. PHYSICAL EXAMINATION:  Physical Exam HENT:     Head: Normocephalic and atraumatic.  Eyes:     Conjunctiva/sclera: Conjunctivae normal.     Pupils: Pupils are equal, round, and reactive to light.  Neck:     Thyroid: No thyromegaly.     Trachea: No tracheal deviation.  Cardiovascular:     Rate and Rhythm: Normal rate and regular rhythm.     Heart sounds: Normal heart sounds.  Pulmonary:     Effort: Pulmonary effort is normal. No respiratory distress.     Breath sounds:  Normal breath sounds. No wheezing.  Chest:     Chest wall: No tenderness.  Abdominal:     General: Bowel sounds are normal. There is no distension.     Palpations: Abdomen is soft.     Tenderness: There is no abdominal tenderness.  Musculoskeletal:        General: Normal range of motion.     Cervical back: Normal range of motion and neck supple.  Skin:    General: Skin is warm and dry.     Findings: No rash.  Neurological:     Mental Status: He is alert and oriented to person, place, and time.     Cranial Nerves: No cranial nerve deficit.    LABORATORY PANEL:  Male CBC Recent Labs  Lab 01/21/19 2206  WBC 16.4*  HGB 15.2  HCT 45.3  PLT 278   ------------------------------------------------------------------------------------------------------------------ Chemistries  Recent Labs  Lab 01/21/19 1332 01/21/19 2206  NA 140  --   K 4.6  --   CL 100  --   CO2 24  --   GLUCOSE 144*  --   BUN 12  --   CREATININE 1.09 1.00  CALCIUM 10.5*  --    RADIOLOGY:  DG Chest 1 View  Result Date: 01/22/2019 CLINICAL DATA:  Pneumothorax. EXAM: CHEST  1 VIEW COMPARISON:  2021. FINDINGS: Post chest tube placement chest x-ray reveals near complete resolution of right-sided pneumothorax with minimal residual. Chest tube is noted over the right lower chest. Low lung volumes. Heart size stable. IMPRESSION: Chest tube noted over the right lower chest. Interim near complete resolution of right-sided  pneumothorax post chest tube placement. Electronically Signed   By: Marcello Moores  Register   On: 01/22/2019 12:14   DG Chest 2 View  Result Date: 01/22/2019 CLINICAL DATA:  Right pneumothorax. EXAM: CHEST - 2 VIEW COMPARISON:  Chest x-ray 01/21/2019.  Chest CT 01/21/2019. FINDINGS: Mediastinum and hilar structures stable. Mild cardiomegaly. No pulmonary venous congestion. Low lung volumes. Mild atelectatic changes right upper lung and both lung bases. Stable right moderate sized pneumothorax, no change  from prior exam. Reference is made to chest CT report for discussion of rib fractures. IMPRESSION: 1. Stable moderate size right pneumothorax, no change from prior exam. 2. Low lung volumes. Mild atelectatic changes right upper lung and both lung bases. 3. Reference is made to chest CT report for discussion of rib fractures. Electronically Signed   By: Marcello Moores  Register   On: 01/22/2019 07:00   DG Chest 2 View  Result Date: 01/21/2019 CLINICAL DATA:  Right pneumothorax EXAM: CHEST - 2 VIEW COMPARISON:  01/21/2019 FINDINGS: Moderate-sized right pneumothorax again noted, stable since prior study. Areas of atelectasis in the right lung. No confluent opacity on the left. Heart is normal size. No effusions. No acute bony abnormality. IMPRESSION: Moderate-sized right apical pneumothorax. Areas of atelectasis in the right lung. Electronically Signed   By: Rolm Baptise M.D.   On: 01/21/2019 21:18   CT IMAGE GUIDED DRAINAGE BY PERCUTANEOUS CATHETER  Result Date: 01/22/2019 INDICATION: Right pneumothorax. EXAM: CT-DIRECTED CHEST TUBE PLACEMENT. MEDICATIONS: None. ANESTHESIA/SEDATION: Fentanyl 75 mcg IV; Versed 2.0 mg IV Moderate Sedation Time:  34 The patient was continuously monitored during the procedure by the interventional radiology nurse under my direct supervision. COMPLICATIONS: None immediate. PROCEDURE: Informed written consent was obtained from the patient after a thorough discussion of the procedural risks, benefits and alternatives. All questions were addressed. Maximal Sterile Barrier Technique was utilized including caps, mask, sterile gowns, sterile gloves, sterile drape, hand hygiene and skin antiseptic. A timeout was performed prior to the initiation of the procedure. The patient's right back was sterilely prepped and draped. Following local anesthesia 1% lidocaine and with CT guidance an 18 gauge needle was advanced into the right pleural space and in and Amplatz wire placed. CR dilatation a 12  [place] performed. This followed by placement of a 12 French chest tube. Chest tube was soaked to pleura vac drainage. There no complications. IMPRESSION: Successful CT-directed chest tube placement. Electronically Signed   By: Marcello Moores  Register   On: 01/22/2019 11:45   ASSESSMENT AND PLAN:   1.  Accelerated hypertension and tachycardia likely secondary to pain Improved with pain control 2.  Anxiety, depression and bipolar disorder.  Continue psychiatric medications 3.  Coronary vascular calcification seen on CT scan of the can consider cardiology referral as outpatient. 4.  Multiple rib fractures, pneumothorax, tachypnea.  High risk for pneumonia.  Surgery following.  Pain medications as per surgery. on 3 L O2 via n.c.  Monitor pulse ox. S/p chest tube via IR on 1/12 5.  Leukocytosis likely reactive secondary to rib fractures and pneumothorax 6. COVID-19 neg      All the records are reviewed and case discussed with Care Management/Social Worker. Management plans discussed with the patient, nursing and they are in agreement.  CODE STATUS: Full Code  TOTAL TIME TAKING CARE OF THIS PATIENT: 15 minutes.   More than 50% of the time was spent in counseling/coordination of care: YES    Max Sane M.D on 01/22/2019 at 9:00 PM  Between 7am to 6pm -  Pager - [phone]  After 6pm go to [email or web address] - password [email or web address]  [facility]   CC: Primary care physician; Center, [facility]  Note: This dictation was prepared with Dragon dictation along with smaller phrase technology. Any transcriptional errors that result from this process are unintentional.

## 2019-01-22 NOTE — Progress Notes (Signed)
  Patient ID: [name], male   DOB: [birth year], 59 y.o.   MRN: [id number]  HISTORY: Still with chest wall pain and difficulty with breathing.  Not hungry.   Vitals:   01/21/19 2357 01/22/19 0419  BP: (!) 172/110 113/90  Pulse: 87 84  Resp: (!) 24 16  Temp: (!) 97.4 F (36.3 C) 98.1 F (36.7 C)  SpO2: 100% 96%     EXAM:    Resp: Lungs are clear bilaterally though sightly diminished.  No respiratory distress, normal effort. Heart:  Regular without murmurs Abd:  Abdomen is soft, non distended and non tender. No masses are palpable.  There is no rebound and no guarding.  Neurological: Alert and oriented to person, place, and time. Coordination normal.  Skin: Skin is warm and dry. No rash noted. No diaphoretic. No erythema. No pallor.  Psychiatric: Normal mood and affect. Normal behavior. Judgment and thought content normal.   Independent review of CXRay shows stable pneumothorax  ASSESSMENT: Blunt trauma with right rib fractures and pneumothorax   PLAN:   Adjust pain meds.  Place chest tube via IR.    Hulda Marin, MD

## 2019-01-22 NOTE — Consult Note (Signed)
ANTICOAGULATION CONSULT NOTE  Pharmacy Consult for post-IR restart of anticoagulation Indication: s/p IR-guided chest tube placement  Patient Measurements: Height: 5\' 9"  (175.3 cm) Weight: 198 lb 3.1 oz (89.9 kg) IBW/kg (Calculated) : 70.7  Vital Signs: Temp: 97.5 F (36.4 C) (01/12 1315) Temp Source: Oral (01/12 1315) BP: 125/81 (01/12 1315) Pulse Rate: 80 (01/12 1315)  Labs: Recent Labs    01/21/19 1332 01/21/19 2206  HGB 16.0 15.2  HCT 46.9 45.3  PLT 300 278  CREATININE 1.09 1.00  TROPONINIHS 10  --     Estimated Creatinine Clearance: 91.5 mL/min (by C-G formula based on SCr of 1 mg/dL).   Medical History: Past Medical History:  Diagnosis Date  . Anxiety   . Arthritis    knees and hands  . Bipolar disorder (HCC)   . Depression   . GERD (gastroesophageal reflux disease)   . Hepatitis    HEP "C"  . History of kidney stones   . Hypertension   . 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 Kidney stones   . Pericarditis 05/2015   a. echo 5/17: EF 60-65%, no RWMA, LV dias fxn nl, LA mildly dilated, RV sys fxn nl, PASP nl, moderate sized circumferential pericardial effusion was identified, 2.12 cm around the LV free wall, <1 cm around the RV free wall. Features were not c/w tamponade physiology  . PTSD (post-traumatic stress disorder)    Witnessed brother's suicide.  6/17 Restless leg syndrome   . Syncope     Assessment: 59 y.o. male states he was wrestling around with his brother last night.  In the ER he was found to have multiple rib fractures and a pneumothorax. This morning a chest tube was placed by interventional radiology. Prior to this he was being administered enoxaparin 40 mg q24h with last known dose 01/21/19 2141.  Goal of Therapy:  Monitor platelets by anticoagulation protocol: Yes   Plan:   Based on Post-IR IR Guidelines for a low-risk event restart enoxaparin tonight at 2200 which is > 4 hours post-procedure  2142 01/22/2019,2:19 PM

## 2019-01-22 NOTE — Progress Notes (Signed)
Pt s/p R chest tube placement.VSS.Chest stable.CXR-pthx improved.F/U with Dr. Thelma Barge.Thankyou for consult.

## 2019-01-22 NOTE — Progress Notes (Signed)
Pt in for Chest Tube placement for PTHX.See Dr.Oaks note.VSS.Pt alert and Ox3.Chest stable.Plan:R Chest Tube-consent obtained

## 2019-01-23 ENCOUNTER — Inpatient Hospital Stay: Payer: Medicaid Other

## 2019-01-23 DIAGNOSIS — S2241XA Multiple fractures of ribs, right side, initial encounter for closed fracture: Secondary | ICD-10-CM

## 2019-01-23 DIAGNOSIS — S270XXA Traumatic pneumothorax, initial encounter: Principal | ICD-10-CM

## 2019-01-23 LAB — CALCITRIOL (1,25 DI-OH VIT D): Vit D, 1,25-Dihydroxy: 45.4 pg/mL (ref 19.9–79.3)

## 2019-01-23 LAB — PARATHYROID HORMONE, INTACT (NO CA): PTH: 54 pg/mL (ref 15–65)

## 2019-01-23 MED ORDER — SODIUM CHLORIDE 0.9 % IV SOLN: INTRAVENOUS | Status: DC

## 2019-01-23 MED ORDER — LIDOCAINE 5 % EX PTCH
1.0000 | MEDICATED_PATCH | CUTANEOUS | Status: DC
  Filled 2019-01-23 (×5): qty 1

## 2019-01-23 MED ORDER — ACETAMINOPHEN 500 MG PO TABS
1000.0000 mg | ORAL_TABLET | Freq: Four times a day (QID) | ORAL | Status: DC | PRN
  Administered 2019-01-23 – 2019-01-24 (×3): 1000 mg via ORAL
  Filled 2019-01-23 (×3): qty 2

## 2019-01-23 MED ORDER — OXYCODONE HCL 5 MG PO TABS
10.0000 mg | ORAL_TABLET | ORAL | Status: DC | PRN
  Administered 2019-01-23 – 2019-01-25 (×9): 10 mg via ORAL
  Filled 2019-01-23 (×9): qty 2

## 2019-01-23 MED ORDER — ORAL CARE MOUTH RINSE
15.0000 mL | Freq: Two times a day (BID) | OROMUCOSAL | Status: DC
  Administered 2019-01-23 – 2019-01-25 (×4): 15 mL via OROMUCOSAL

## 2019-01-23 MED ORDER — SODIUM CHLORIDE 0.9 % IV BOLUS
1000.0000 mL | Freq: Once | INTRAVENOUS | Status: AC
  Administered 2019-01-23: 1000 mL via INTRAVENOUS

## 2019-01-23 MED ORDER — MORPHINE SULFATE ER 15 MG PO TBCR: 15.0000 mg | EXTENDED_RELEASE_TABLET | Freq: Two times a day (BID) | ORAL | Status: DC | PRN

## 2019-01-23 MED ORDER — IPRATROPIUM-ALBUTEROL 0.5-2.5 (3) MG/3ML IN SOLN
3.0000 mL | Freq: Four times a day (QID) | RESPIRATORY_TRACT | Status: DC | PRN
  Filled 2019-01-23: qty 3

## 2019-01-23 NOTE — Progress Notes (Signed)
Patient with no void post bolus. Bladder scan patient for 525 ml after tried to void.  MD notified for orders.

## 2019-01-23 NOTE — Progress Notes (Signed)
[name] NAME: [name]    MR#:  [id number]  DATE OF BIRTH:  [birth year]  SUBJECTIVE:  CHIEF COMPLAINT:   Chief Complaint  Patient presents with  . Shortness of Breath  Overnight rapid response was called on this patient as he was having increased tachypnea and pain.  This morning saying he is still having pain and appears to be tachypneic.  States pain is better than overnight.  Denies any abdominal pain.  REVIEW OF SYSTEMS:  Review of Systems  Constitutional: Negative for diaphoresis, fever, malaise/fatigue and weight loss.  HENT: Negative for ear discharge, ear pain, hearing loss, nosebleeds, sore throat and tinnitus.   Eyes: Negative for blurred vision and pain.  Respiratory: Positive for shortness of breath. Negative for cough, hemoptysis and wheezing.   Cardiovascular: Positive for chest pain. Negative for palpitations, orthopnea and leg swelling.  Gastrointestinal: Negative for abdominal pain, blood in stool, constipation, diarrhea, heartburn, nausea and vomiting.  Genitourinary: Negative for dysuria, frequency and urgency.  Musculoskeletal: Negative for back pain and myalgias.  Skin: Negative for itching and rash.  Neurological: Negative for dizziness, tingling, tremors, focal weakness, seizures, weakness and headaches.  Psychiatric/Behavioral: Negative for depression. The patient is not nervous/anxious.     DRUG ALLERGIES:   Allergies  Allergen Reactions  . Toradol [Ketorolac Tromethamine] Hives   VITALS:  Blood pressure 105/71, pulse 78, temperature 98 F (36.7 C), temperature source Oral, resp. rate (!) 24, height 5\' 9"  (1.753 m), weight 89.9 kg, SpO2 93 %. PHYSICAL EXAMINATION:  Physical Exam HENT:     Head: Normocephalic and atraumatic.  Eyes:     Conjunctiva/sclera: Conjunctivae normal.     Pupils: Pupils are equal, round, and reactive to light.  Neck:     Thyroid: No thyromegaly.     Trachea: No tracheal deviation.   Cardiovascular:     Rate and Rhythm: Normal rate and regular rhythm.     Heart sounds: Normal heart sounds.  Pulmonary:     Effort: Pulmonary effort is normal. No respiratory distress.     Breath sounds: Normal breath sounds. No wheezing.  Chest:     Chest wall: No tenderness.  Abdominal:     General: Bowel sounds are normal. There is no distension.     Palpations: Abdomen is soft.     Tenderness: There is no abdominal tenderness.  Musculoskeletal:        General: Normal range of motion.     Cervical back: Normal range of motion and neck supple.  Skin:    General: Skin is warm and dry.     Findings: No rash.  Neurological:     Mental Status: He is alert and oriented to person, place, and time.     Cranial Nerves: No cranial nerve deficit.    LABORATORY PANEL:  Male CBC Recent Labs  Lab 01/21/19 2206  WBC 16.4*  HGB 15.2  HCT 45.3  PLT 278   ------------------------------------------------------------------------------------------------------------------ Chemistries  Recent Labs  Lab 01/21/19 1332 01/21/19 2206  NA 140  --   K 4.6  --   CL 100  --   CO2 24  --   GLUCOSE 144*  --   BUN 12  --   CREATININE 1.09 1.00  CALCIUM 10.5*  --    RADIOLOGY:  DG Abd 1 View  Result Date: 01/23/2019 CLINICAL DATA:  Right-sided abdominal pain and abdominal distention. EXAM: ABDOMEN - 1 VIEW COMPARISON:  Scout image for CT  scan of the abdomen dated 01/27/2016 FINDINGS: The bowel gas pattern is normal. No radio-opaque calculi or other significant radiographic abnormality are seen. IMPRESSION: Negative. Electronically Signed   By: Francene Boyers M.D.   On: 01/23/2019 10:59   DG Chest Port 1 View  Result Date: 01/23/2019 CLINICAL DATA:  Follow-up right pneumothorax EXAM: PORTABLE CHEST 1 VIEW COMPARISON:  Yesterday FINDINGS: Right-sided chest tube in place. Probable trace right apical pneumothorax. Stable low volumes with interstitial opacity reflecting atelectasis by CT. Normal  heart size and stable mediastinal contours. Known right rib fractures. IMPRESSION: 1. Probable trace residual right apical pneumothorax. 2. Stable low volumes with atelectasis. Electronically Signed   By: Marnee Spring M.D.   On: 01/23/2019 05:28   ASSESSMENT AND PLAN:   1.  Accelerated hypertension: Currently low normal  - tachycardia resolved  -  likely secondary to pain - Improved with pain control  2.  Anxiety, depression and bipolar disorder: Anxiety may also contributing to his tachypnea  -   Continue psychiatric medications  3.  Coronary vascular calcification:  - seen on CT scan of the chest; consider cardiology referral as outpatient.  4.  Multiple rib fractures, pneumothorax, tachypnea: - S/p chest tube via IR on 1/12 -  High risk for pneumonia.  -  Surgery following: appreciate Care  -   Pain medications as per surgery - on 3 L O2 via n.c.  Monitor pulse ox. - Ab Xray shows no obstruction   5.  Leukocytosis: Down-trending -  likely reactive secondary to rib fractures and pneumothorax - COVID-19 neg - Culture negative so far    All the records are reviewed and case discussed with Care Management/Social Worker. Management plans discussed with the patient, nursing and they are in agreement.  CODE STATUS: Full Code  TOTAL TIME TAKING CARE OF THIS PATIENT: 15 minutes.   More than 50% of the time was spent in counseling/coordination of care: Nyra Market M.D on 01/23/2019 at 1:18 PM  Between 7am to 6pm - Pager - [phone]  After 6pm go to [email or web address] - password [email or web address]  [facility]   CC: Primary care physician; Center, [facility]  Note: This dictation was prepared with Dragon dictation along with smaller phrase technology. Any transcriptional errors that result from this process are unintentional.

## 2019-01-23 NOTE — Progress Notes (Signed)
Patient complains of "not being able to breathe" and increased pain in right chest. MD notified and he ordered stat chest ray and to give scheduled tramadol now.

## 2019-01-23 NOTE — Progress Notes (Signed)
[place] Hospital Day(s): 1.   Post op day(s):  Marland Kitchen   Interval History: Patient seen and examined. Patient reports no change in breathing status. Pt has not used spirometer reporting too much pain with the rib fractures to adequately take a deep breath. Pt stated he had just received his pain medication prior to exam. Pt reports that the pain medication has not been adequately controlling his pain. Pt does not have an appetite, has not eaten since yesterday and denies feeling hungry. Pt reports right sided abdominal tenderness, increased with palpation. Distension noted on physical exam. Pt reported using the bathroom today. Pt denies having ambulated. Pt reports improvement in nausea and denies vomiting this morning.   Review of Systems:  Constitutional: denies fever, chills  Respiratory: denies any shortness of breath  Cardiovascular: denies chest pain or palpitations  Gastrointestinal: denies N/V, or diarrhea/and bowel function as per interval history. Reports right sided ABD pain Musculoskeletal: denies decreased motor or sensation, reports pain on right side of chest along rib fractures.  Integumentary: denies any other rashes or skin discolorations  Vital signs in last 24 hours: [min-max] current  Temp:  [97.5 F (36.4 C)-98.6 F (37 C)] 98 F (36.7 C) (01/13 0519) Pulse Rate:  [80-89] 87 (01/13 0519) Resp:  [11-29] 24 (01/13 0519) BP: (113-138)/(71-101) 118/85 (01/13 0519) SpO2:  [91 %-98 %] 94 % (01/13 0519)     Height: 5\' 9"  (175.3 cm) Weight: 89.9 kg BMI (Calculated): 29.25   Intake/Output last 2 shifts:  01/12 0701 - 01/13 0700 In: -  Out: 750 [Urine:750]   Physical Exam:  Constitutional: alert, cooperative and no distress  Respiratory: breathing non-labored at rest  Cardiovascular: regular rate and sinus rhythm  Gastrointestinal: tender to palpation on right side, distended Integumentary: no rashes or bruising noted.   Labs:   CBC Latest Ref Rng & Units 01/21/2019 01/21/2019 01/04/2018  WBC 4.0 - 10.5 K/uL 16.4(H) 15.0(H) 7.7  Hemoglobin 13.0 - 17.0 g/dL 15.2 16.0 8.4(L)  Hematocrit 39.0 - 52.0 % 45.3 46.9 25.7(L)  Platelets 150 - 400 K/uL 278 300 253   CMP Latest Ref Rng & Units 01/21/2019 01/21/2019 01/04/2018  Glucose 70 - 99 mg/dL - 144(H) 103(H)  BUN 6 - 20 mg/dL - 12 8  Creatinine 0.61 - 1.24 mg/dL 1.00 1.09 0.69  Sodium 135 - 145 mmol/L - 140 136  Potassium 3.5 - 5.1 mmol/L - 4.6 3.2(L)  Chloride 98 - 111 mmol/L - 100 102  CO2 22 - 32 mmol/L - 24 26  Calcium 8.9 - 10.3 mg/dL - 10.5(H) 8.6(L)  Total Protein 6.5 - 8.1 g/dL - - 6.3(L)  Total Bilirubin 0.3 - 1.2 mg/dL - - 0.4  Alkaline Phos 38 - 126 U/L - - 83  AST 15 - 41 U/L - - 10(L)  ALT 0 - 44 U/L - - 7     Imaging studies: CXR personally reviewed and agree with radiology impression below: IMPRESSION: 1. Probable trace residual right apical pneumothorax. 2. Stable low volumes with atelectasis.   Assessment/Plan:  59 y.o. male with right sided pneumothorax with multiple right sided rib fractures s/p chest tube via IR on 01/22/19. Pt reported new right sided abdominal pain this AM with tenderness on palpation and distension noted on physical exam.    - Abdominal CT for new abdominal pain  - ambulation recommended   - continue spirometry  All of the above findings and recommendations were discussed with the patient  and the medical team, and all of patient's questions were answered to his expressed satisfaction.  -- Victor Sago, PA-S [facility] 01/23/2019, 8:14 AM [phone] M-F: 7am - 4pm

## 2019-01-23 NOTE — Progress Notes (Signed)
  Patient ID: [name], male   DOB: 17-Jan-1960, 59 y.o.   MRN: [id number]  HISTORY: Mr. Pfarr continues to complain of significant chest wall pain.  He also complains of shortness of breath.  We have modified his pain regimen on several occasions.  He is allergic to Toradol cannot take any of the nonsteroidal anti-inflammatory agents.  He cannot take the oral narcotics as a cause a headache.  We attempted to begin MS Contin however he would like to try something other than that to begin with.  Will consult our pharmacist to assist in providing him with adequate pain management.  We will also begin tramadol and Robaxin.   Vitals:   01/23/19 0438 01/23/19 0519  BP: 113/77 118/85  Pulse: 80 87  Resp: (!) 24 (!) 24  Temp: 98.6 F (37 C) 98 F (36.7 C)  SpO2: 93% 94%     EXAM:    Resp: Lungs are clear bilaterally.  No respiratory distress, normal effort. Heart:  Regular without murmurs Abd:  Abdomen is soft, non distended and non tender. No masses are palpable.  There is no rebound and no guarding.  Neurological: Alert and oriented to person, place, and time. Coordination normal.  Skin: Skin is warm and dry. No rash noted. No diaphoretic. No erythema. No pallor.  Psychiatric: Normal mood and affect. Normal behavior. Judgment and thought content normal.    ASSESSMENT: Multiple right-sided rib fractures with pneumothorax   PLAN:   I have independently reviewed his chest x-ray from today.  There is a very small apical pneumothorax.  There is no air leak from the chest tube.  We will continue to maintain his chest tube to suction and provide him with adequate pain relief.    Hulda Marin, MD

## 2019-01-24 ENCOUNTER — Inpatient Hospital Stay: Payer: Medicaid Other

## 2019-01-24 LAB — CBC
HCT: 38.4 % — ABNORMAL LOW (ref 39.0–52.0)
Hemoglobin: 12.4 g/dL — ABNORMAL LOW (ref 13.0–17.0)
MCH: 31.2 pg (ref 26.0–34.0)
MCHC: 32.3 g/dL (ref 30.0–36.0)
MCV: 96.5 fL (ref 80.0–100.0)
Platelets: 192 10*3/uL (ref 150–400)
RBC: 3.98 MIL/uL — ABNORMAL LOW (ref 4.22–5.81)
RDW: 14.3 % (ref 11.5–15.5)
WBC: 7.7 10*3/uL (ref 4.0–10.5)
nRBC: 0 % (ref 0.0–0.2)

## 2019-01-24 LAB — BASIC METABOLIC PANEL
Anion gap: 11 (ref 5–15)
BUN: 25 mg/dL — ABNORMAL HIGH (ref 6–20)
CO2: 21 mmol/L — ABNORMAL LOW (ref 22–32)
Calcium: 8.7 mg/dL — ABNORMAL LOW (ref 8.9–10.3)
Chloride: 107 mmol/L (ref 98–111)
Creatinine, Ser: 1.14 mg/dL (ref 0.61–1.24)
GFR calc Af Amer: >60 mL/min (ref >60)
GFR calc non Af Amer: >60 mL/min (ref >60)
Glucose, Bld: 90 mg/dL (ref 70–99)
Potassium: 3.9 mmol/L (ref 3.5–5.1)
Sodium: 139 mmol/L (ref 135–145)

## 2019-01-24 MED ORDER — METHOCARBAMOL 500 MG PO TABS
500.0000 mg | ORAL_TABLET | Freq: Four times a day (QID) | ORAL | Status: DC
  Administered 2019-01-24 – 2019-01-25 (×5): 500 mg via ORAL
  Filled 2019-01-24 (×5): qty 1

## 2019-01-24 MED ORDER — BISACODYL 10 MG RE SUPP: 10.0000 mg | Freq: Every day | RECTAL | Status: DC | PRN

## 2019-01-24 MED ORDER — POLYETHYLENE GLYCOL 3350 17 G PO PACK: 17.0000 g | PACK | Freq: Every day | ORAL | Status: DC | PRN

## 2019-01-24 MED ORDER — PSYLLIUM 95 % PO PACK
1.0000 | PACK | Freq: Every day | ORAL | Status: DC | PRN
  Filled 2019-01-24: qty 1

## 2019-01-24 NOTE — Progress Notes (Signed)
[facility] SURGICAL PROGRESS NOTE (cpt [phone])  Hospital Day(s): 2.   Interval History: Patient seen and examined, no acute events or new complaints overnight. Patient reports that he is feeling better this morning. He is still complaining of right sided chest pain worse with inspiration however the muscle relaxer has helped this. No fever, chills, cough, nausea, or emesis. He has started using his IS more regularly. Has been mobilizing. No other complaints.   Review of Systems:  Constitutional: denies fever, chills  HEENT: denies cough or congestion  Respiratory: denies any shortness of breath  Cardiovascular: denies chest pain or palpitations  Gastrointestinal: denies abdominal pain, N/V, or diarrhea/and bowel function as per interval history Genitourinary: denies burning with urination or urinary frequency Musculoskeletal: + Pain (right chest), denied decreased motor or sensation   Vital signs in last 24 hours: [min-max] current  Temp:  [97.9 F (36.6 C)] 97.9 F (36.6 C) (01/14 0534) Pulse Rate:  [78-84] 82 (01/14 0534) Resp:  [20] 20 (01/14 0534) BP: (105-132)/(71-82) 128/82 (01/14 0534) SpO2:  [90 %-93 %] 91 % (01/14 0534)     Height: 5\' 9"  (175.3 cm) Weight: 89.9 kg BMI (Calculated): 29.25   Intake/Output last 2 shifts:  01/13 0701 - 01/14 0700 In: 2911.9 [P.O.:460; I.V.:2451.9] Out: 930 [Urine:900; Chest Tube:30]   Physical Exam:  Constitutional: alert, cooperative and no distress  HENT: normocephalic without obvious abnormality  Respiratory: Decreased inspiratory effort, lungs are CTAB, on [place] Cardiovascular: regular rate and sinus rhythm  Chest: Chest tube site is CDI, no drainage, no air leak appreciable. On pleura-vac   Labs:  CBC Latest Ref Rng & Units 01/21/2019 01/21/2019 01/04/2018  WBC 4.0 - 10.5 K/uL 16.4(H) 15.0(H) 7.7  Hemoglobin 13.0 - 17.0 g/dL 15.2 16.0 8.4(L)  Hematocrit 39.0 - 52.0 % 45.3 46.9 25.7(L)  Platelets 150 - 400 K/uL 278 300  253   CMP Latest Ref Rng & Units 01/21/2019 01/21/2019 01/04/2018  Glucose 70 - 99 mg/dL - 144(H) 103(H)  BUN 6 - 20 mg/dL - 12 8  Creatinine 0.61 - 1.24 mg/dL 1.00 1.09 0.69  Sodium 135 - 145 mmol/L - 140 136  Potassium 3.5 - 5.1 mmol/L - 4.6 3.2(L)  Chloride 98 - 111 mmol/L - 100 102  CO2 22 - 32 mmol/L - 24 26  Calcium 8.9 - 10.3 mg/dL - 10.5(H) 8.6(L)  Total Protein 6.5 - 8.1 g/dL - - 6.3(L)  Total Bilirubin 0.3 - 1.2 mg/dL - - 0.4  Alkaline Phos 38 - 126 U/L - - 83  AST 15 - 41 U/L - - 10(L)  ALT 0 - 44 U/L - - 7     Imaging studies:   CXR (01/24/2019) personally reviewed which shows resolution of right pneumothorax, and radiologist report reviewed:  IMPRESSION: Pigtail RIGHT thoracostomy tube with mild RIGHT basilar atelectasis.  No definite pneumothorax.   Assessment/Plan: (ICD-10's: J93.9)  59 y.o. male with right sided rib pain and shortness of breath attributable to right sided rib fractures and small pneumothorax after fall from standing s/p chest tube placement on 54/27, complicated by multiple pertinent comorbidities    - Will place chest tube to water seal today; repeat CXR in the morning. If PTX remains resolved should be fine to remove chest tube tomorrow  - Continue pain control; scheduled muscle relaxer  - Encouraged continued pulmonary toilet; frequent IS use  - Mobilization encouraged  - Comorbid management per medicine team; appreciate the assistance   All of the above findings and  recommendations were discussed with the patient, and the medical team, and all of patient's questions were answered to her expressed satisfaction.  -- Lynden Oxford, PA-C [facility] 01/24/2019, 7:09 AM [phone] M-F: 7am - 4pm

## 2019-01-24 NOTE — Progress Notes (Signed)
[place] [facility] [place] NAME: [name]    MR#:  [id number]  DATE OF BIRTH:  04-28-1960  SUBJECTIVE:  CHIEF COMPLAINT:   Chief Complaint  Patient presents with  . Shortness of Breath  Overnight rapid response was called on this patient as he was having increased tachypnea and pain.  This morning saying he is still having pain and appears to be tachypneic.  States pain is better than overnight.  Denies any abdominal pain.  REVIEW OF SYSTEMS:  + SOB + chest wall pain   DRUG ALLERGIES:   Allergies  Allergen Reactions  . Toradol [Ketorolac Tromethamine] Hives   VITALS:  Blood pressure (!) 144/92, pulse 82, temperature 97.9 F (36.6 C), temperature source Oral, resp. rate 20, height 5\' 9"  (1.753 m), weight 89.9 kg, SpO2 91 %. PHYSICAL EXAMINATION:  Physical Exam HENT:     Head: Normocephalic and atraumatic.  Eyes:     Conjunctiva/sclera: Conjunctivae normal.     Pupils: Pupils are equal, round, and reactive to light.  Neck:     Thyroid: No thyromegaly.     Trachea: No tracheal deviation.  Cardiovascular:     Rate and Rhythm: Normal rate and regular rhythm.     Heart sounds: Normal heart sounds.  Pulmonary:     Effort: Pulmonary effort is normal. No respiratory distress.     Breath sounds: Normal breath sounds. No wheezing.  Chest:     Chest wall: No tenderness.  Abdominal:     General: Bowel sounds are normal. There is no distension.     Palpations: Abdomen is soft.     Tenderness: There is no abdominal tenderness.  Musculoskeletal:        General: Normal range of motion.     Cervical back: Normal range of motion and neck supple.  Skin:    General: Skin is warm and dry.     Findings: No rash.  Neurological:     Mental Status: He is alert and oriented to person, place, and time.     Cranial Nerves: No cranial nerve deficit.    LABORATORY PANEL:  Male CBC Recent Labs  Lab 01/24/19 0824  WBC 7.7  HGB 12.4*  HCT 38.4*  PLT 192    ------------------------------------------------------------------------------------------------------------------ Chemistries  Recent Labs  Lab 01/24/19 0824  NA 139  K 3.9  CL 107  CO2 21*  GLUCOSE 90  BUN 25*  CREATININE 1.14  CALCIUM 8.7*   RADIOLOGY:  DG Chest Port 1 View  Result Date: 01/24/2019 CLINICAL DATA:  Follow-up RIGHT pneumothorax, chest tube EXAM: PORTABLE CHEST 1 VIEW COMPARISON:  Portable exam 0029 hours compared to 01/23/2019 Exam is mislabeled in PACs as 01/10/2013 due to time stamp error within portable radiographic machine, being addressed. FINDINGS: Pigtail RIGHT thoracostomy tube again seen. Elevation of RIGHT diaphragm. Stable heart size and mediastinal contours. No definite residual pneumothorax. Persistent mild atelectasis at both lung bases. IMPRESSION: Pigtail RIGHT thoracostomy tube with mild RIGHT basilar atelectasis. No definite pneumothorax. Electronically Signed   By: Lavonia Dana M.D.   On: 01/24/2019 08:32   ASSESSMENT AND PLAN:   1.  Accelerated hypertension: intermittently labile  - tachycardia resolved  -  likely secondary to pain - Improved with pain control  2.  Anxiety, depression and bipolar disorder: Anxiety may also contributing to his tachypnea  -   Continue psychiatric medications  3.  Coronary vascular calcification:  - seen on CT scan of the chest; consider cardiology referral  as outpatient.  4.  Multiple rib fractures, pneumothorax, tachypnea: - S/p chest tube via IR on 1/12 -  High risk for pneumonia.  -  Surgery following: appreciate Care  -   Pain medications as per surgery - on 3 L O2 via n.c.  Monitor pulse ox. - Ab Xray shows no obstruction  -Follow-up chest x-ray shows improvement from the last 1  5.  Leukocytosis: Solved -  likely reactive secondary to rib fractures and pneumothorax - COVID-19 neg - Culture negative so far   6.  Diet: Patient states his appetite is very poor at this point -Encouraged p.o. intake  -Added bowel regimen -Encourage ambulation as tolerated   All the records are reviewed and case discussed with Care Management/Social Worker. Management plans discussed with the patient, nursing and they are in agreement.  CODE STATUS: Full Code  TOTAL TIME TAKING CARE OF THIS PATIENT: 30 minutes.   More than 50% of the time was spent in counseling/coordination of care: Nyra Market M.D on 01/24/2019 at 11:48 AM  Between 7am to 6pm - Pager - [phone]  After 6pm go to [email or web address] - password [email or web address]  [facility]   CC: Primary care physician; Center, [facility]  Note: This dictation was prepared with Dragon dictation along with smaller phrase technology. Any transcriptional errors that result from this process are unintentional.

## 2019-01-25 ENCOUNTER — Inpatient Hospital Stay: Payer: Medicaid Other

## 2019-01-25 MED ORDER — METHOCARBAMOL 500 MG PO TABS: 500.0000 mg | ORAL_TABLET | Freq: Four times a day (QID) | ORAL | 0 refills | Status: DC

## 2019-01-25 MED ORDER — TRAMADOL HCL 50 MG PO TABS: 100.0000 mg | ORAL_TABLET | Freq: Four times a day (QID) | ORAL | 0 refills | Status: DC

## 2019-01-25 MED ORDER — POLYETHYLENE GLYCOL 3350 17 G PO PACK: 17.0000 g | PACK | Freq: Every day | ORAL | 0 refills | Status: DC | PRN

## 2019-01-25 MED ORDER — DOCUSATE SODIUM 100 MG PO CAPS: 100.0000 mg | ORAL_CAPSULE | Freq: Two times a day (BID) | ORAL | 0 refills | Status: DC

## 2019-01-25 MED ORDER — INFLUENZA VAC SPLIT QUAD 0.5 ML IM SUSY: 0.5000 mL | PREFILLED_SYRINGE | INTRAMUSCULAR | Status: DC

## 2019-01-25 MED ORDER — OXYCODONE HCL 10 MG PO TABS: 10.0000 mg | ORAL_TABLET | Freq: Four times a day (QID) | ORAL | 0 refills | Status: DC | PRN

## 2019-01-25 NOTE — Discharge Summary (Signed)
[facility] SUR[facility]ICAL DISCHARGE SUMMARY (cpt: [phone])  Patient ID: [name] MRN: [id number] DOB/AGE: 1960/12/31 59 y.o.  Admit date: 01/21/2019 Discharge date: 01/25/2019  Discharge Diagnoses Patient Active Problem List   Diagnosis Date Noted  . Pneumothorax on right 01/21/2019  . Multiple closed fractures of ribs of right side     Consultants Medicine  Procedures 01/22/2019:  Chest Tube Insertion  HPI: 59 y.o. male presented to [facility] ED 01/11 for evaluation of chest pain and shortness of breath. Patient reports that he was "horsing around" with his brother yesterday when he fell from standing and struck a wooden surface on his left lateral chest. Since that time, he has had significant pain to his left lateral chest wall and shortness of breath. Pain is exacerbated with deep inspiration. Laying flat makes the pain better. No fever, chills, cough, congestion, or any additional traumatic injuries. He does have a 20 ppy smoking history which he quit. Work up in the ED was concerning for hypoxia on room air at presentation, 3-5th right sided rib fractures, and small apical pneumothorax on CXR and CT.   Hospital Course: Patient was admitted to general surgery and conservative management with high flow O2 was initiated. Medicine was consulted for management of comorbidities. On HD1 his Apical pneumothorax had not resolved and he was complaining of SOB. IR placed chest tube which resulted in improved of his right pneumothorax. His chest tube was placed to water seal on 01/14 and follow up chest Xray on the morning showed resolution of his pneumothorax without air leak. Chest tube was removed on 01/15. On the day of discharge his pain was reasonably controlled, he was pulling >1500 on IS, mobilizing, and tolerating PO. Discharge planning was initiated accordingly with patient safely able to be discharged home with appropriate discharge instructions, pain control, and outpatient  follow-up after all of his questions were answered to his expressed satisfaction.    Discharge Condition: Good   Physical Examination:  Constitutional: Well appearing male, NAD Pulmonary: Decreased respiratory effort, lungs are CTAB Chest: Chest tube removed prior to discharge; occlusive dressing placed Skin: Warm, Dry    Allergies as of 01/25/2019      Reactions   Toradol [ketorolac Tromethamine] Hives      Medication List    TAKE these medications   aspirin EC 325 MG tablet Take 1 tablet (325 mg total) by mouth daily.   divalproex 500 MG DR tablet Commonly known as: DEPAKOTE Take 500 mg by mouth 2 (two) times daily.   docusate sodium 100 MG capsule Commonly known as: Colace Take 1 capsule (100 mg total) by mouth 2 (two) times daily.   FLUoxetine 40 MG capsule Commonly known as: PROZAC Take 80 mg by mouth daily.   methocarbamol 500 MG tablet Commonly known as: ROBAXIN Take 1 tablet (500 mg total) by mouth every 6 (six) hours.   multivitamin with minerals Tabs tablet Take 1 tablet by mouth daily.   Oxycodone HCl 10 MG Tabs Take 1 tablet (10 mg total) by mouth every 6 (six) hours as needed for severe pain or breakthrough pain.   polyethylene glycol 17 g packet Commonly known as: MIRALAX / GLYCOLAX Take 17 g by mouth daily as needed for mild constipation or moderate constipation.   SEROquel XR 400 MG 24 hr tablet Generic drug: QUEtiapine Take 400 mg by mouth at bedtime.   QUEtiapine 50 MG tablet Commonly known as: SEROQUEL Take 50 mg by mouth 4 (four) times daily as  needed (for agitation).   traMADol 50 MG tablet Commonly known as: ULTRAM Take 2 tablets (100 mg total) by mouth every 6 (six) hours.        Follow-up Information    Hulda Marin, MD. Schedule an appointment as soon as possible for a visit on 02/01/2019.   Specialties: Cardiothoracic Surgery, General Surgery Why: right pneumothorax, needs CXR before Contact information: [address] [place] [address] [phone]            Time spent on discharge management including discussion of hospital course, clinical condition, outpatient instructions, prescriptions, and follow up with the patient and members of the medical team: >30 minutes  -- Lynden Oxford , PA-C [facility]  01/25/2019, 10:00 AM [phone] M-F: 7am - 4pm

## 2019-01-25 NOTE — Progress Notes (Signed)
[place] [facility]   PATIENT NAME: [name]    MR#:  [id number]  DATE OF BIRTH:  [birth year]  SUBJECTIVE:  CHIEF COMPLAINT:   Chief Complaint  Patient presents with  . Shortness of Breath  No acute issues overnight and this morning.  Follow-up/serial chest x-ray showed complete resolution of pneumothorax without air leak.  Chest tube removed by the surgery this morning.  Patient says he is doing well and had some breakfast this morning.  REVIEW OF SYSTEMS:   + chest wall pain   DRUG ALLERGIES:   Allergies  Allergen Reactions  . Toradol [Ketorolac Tromethamine] Hives   VITALS:  Blood pressure 113/80, pulse 79, temperature 97.9 F (36.6 C), temperature source Oral, resp. rate 20, height 5\' 9"  (1.753 m), weight 89.9 kg, SpO2 95 %. PHYSICAL EXAMINATION:  Physical Exam HENT:     Head: Normocephalic and atraumatic.  Eyes:     Conjunctiva/sclera: Conjunctivae normal.     Pupils: Pupils are equal, round, and reactive to light.  Neck:     Thyroid: No thyromegaly.     Trachea: No tracheal deviation.  Cardiovascular:     Rate and Rhythm: Normal rate and regular rhythm.     Heart sounds: Normal heart sounds.  Pulmonary:     Effort: Pulmonary effort is normal. No respiratory distress.     Breath sounds: Normal breath sounds. No wheezing.  Chest:     Chest wall: No tenderness.  Abdominal:     General: Bowel sounds are normal. There is no distension.     Palpations: Abdomen is soft.     Tenderness: There is no abdominal tenderness.  Musculoskeletal:        General: Normal range of motion.     Cervical back: Normal range of motion and neck supple.  Skin:    General: Skin is warm and dry.     Findings: No rash.  Neurological:     Mental Status: He is alert and oriented to person, place, and time.     Cranial Nerves: No cranial nerve deficit.    LABORATORY PANEL:  Male CBC Recent Labs  Lab 01/24/19 0824  WBC 7.7  HGB 12.4*  HCT 38.4*  PLT 192    ------------------------------------------------------------------------------------------------------------------ Chemistries  Recent Labs  Lab 01/24/19 0824  NA 139  K 3.9  CL 107  CO2 21*  GLUCOSE 90  BUN 25*  CREATININE 1.14  CALCIUM 8.7*   RADIOLOGY:  DG Chest 2 View  Result Date: 01/25/2019 CLINICAL DATA:  Right pneumothorax. EXAM: CHEST - 2 VIEW COMPARISON:  01/23/2019 FINDINGS: The right pneumothorax has resolved. Chest tube remains in place. Heart size and vascularity are normal. Lungs are clear. Slight persistent elevation of the right hemidiaphragm. No effusions.  No bone abnormality. IMPRESSION: Resolution of the right pneumothorax. Chest tube remains in place. Electronically Signed   By: 01/25/2019 M.D.   On: 01/25/2019 07:57   ASSESSMENT AND PLAN:   1.  Accelerated hypertension: Stable - tachycardia resolved  -  likely secondary to pain - Improved with pain control  2.  Anxiety, depression and bipolar disorder: Anxiety may also contributing to his tachypnea  -Patient says he has history of anxiety.  And he supposed to see his psych doctor. - Continue psychiatric medications -Low up outpatient  3.  Coronary vascular calcification:  - seen on CT scan of the chest -Follow-up PCP for possible cardiology referral  4.  Multiple rib fractures, pneumothorax, tachypnea: -Resolved  -  s/p chest tube via IR on 1/12 -Follow-up chest x-ray shows complete resolution -Chest tube removed on 01/25/19 AM by surgery -  Patient did well on ambulation and at rest on room air  5.  Leukocytosis: Resolved -  likely reactive secondary to rib fractures and pneumothorax - COVID-19 neg - Culture negative so far   6.  Diet: Diet has been very poor but improving daily -Encouraged p.o. intake -Added bowel regimen -Encourage ambulation as tolerated   All the records are reviewed and case discussed with Care Management/Social Worker. Management plans discussed with the  patient, nursing and they are in agreement.  CODE STATUS: Full Code  TOTAL TIME TAKING CARE OF THIS PATIENT: 30 minutes.   More than 50% of the time was spent in counseling/coordination of care: Harriett Rush M.D on 01/25/2019 at 12:20 PM  Between 7am to 6pm - Pager - [phone]  After 6pm go to [email or web address] - password [email or web address]  [facility]   CC: Primary care physician; Center, [facility] [place]  Note: This dictation was prepared with Dragon dictation along with smaller phrase technology. Any transcriptional errors that result from this process are unintentional.

## 2019-01-25 NOTE — Progress Notes (Signed)
Ivar Drape  A and O x 4. VSS. Pt tolerating diet well. No complaints of pain or nausea. IV removed intact, prescriptions given. Pt voiced understanding of discharge instructions with no further questions. Pt discharged via wheelchair with NT.     Allergies as of 01/25/2019      Reactions   Toradol [ketorolac Tromethamine] Hives      Medication List    TAKE these medications   aspirin EC 325 MG tablet Take 1 tablet (325 mg total) by mouth daily.   divalproex 500 MG DR tablet Commonly known as: DEPAKOTE Take 500 mg by mouth 2 (two) times daily.   docusate sodium 100 MG capsule Commonly known as: Colace Take 1 capsule (100 mg total) by mouth 2 (two) times daily.   FLUoxetine 40 MG capsule Commonly known as: PROZAC Take 80 mg by mouth daily.   methocarbamol 500 MG tablet Commonly known as: ROBAXIN Take 1 tablet (500 mg total) by mouth every 6 (six) hours.   multivitamin with minerals Tabs tablet Take 1 tablet by mouth daily.   Oxycodone HCl 10 MG Tabs Take 1 tablet (10 mg total) by mouth every 6 (six) hours as needed for severe pain or breakthrough pain.   polyethylene glycol 17 g packet Commonly known as: MIRALAX / GLYCOLAX Take 17 g by mouth daily as needed for mild constipation or moderate constipation.   SEROquel XR 400 MG 24 hr tablet Generic drug: QUEtiapine Take 400 mg by mouth at bedtime.   QUEtiapine 50 MG tablet Commonly known as: SEROQUEL Take 50 mg by mouth 4 (four) times daily as needed (for agitation).   traMADol 50 MG tablet Commonly known as: ULTRAM Take 2 tablets (100 mg total) by mouth every 6 (six) hours.       Vitals:   01/24/19 1944 01/25/19 0505  BP: (!) 145/80 113/80  Pulse: 78 79  Resp: (!) 22 20  Temp: 98.3 F (36.8 C) 97.9 F (36.6 C)  SpO2: 97% 95%    Suzzanne Cloud

## 2019-01-30 ENCOUNTER — Other Ambulatory Visit: Payer: Self-pay

## 2019-01-30 DIAGNOSIS — J939 Pneumothorax, unspecified: Secondary | ICD-10-CM

## 2019-01-30 NOTE — Progress Notes (Signed)
Order placed for chest x ray

## 2019-01-31 ENCOUNTER — Ambulatory Visit
Admission: RE | Admit: 2019-01-31 | Discharge: 2019-01-31 | Disposition: A | Discharge status: Home / Self Care | Payer: Medicaid Other | Source: Ambulatory Visit | Attending: Cardiothoracic Surgery | Admitting: Cardiothoracic Surgery

## 2019-01-31 ENCOUNTER — Other Ambulatory Visit: Payer: Self-pay

## 2019-01-31 DIAGNOSIS — J939 Pneumothorax, unspecified: Secondary | ICD-10-CM | POA: Diagnosis present

## 2019-01-31 LAB — PTH-RELATED PEPTIDE: PTH-related peptide: <2 pmol/L

## 2019-02-01 ENCOUNTER — Ambulatory Visit (INDEPENDENT_AMBULATORY_CARE_PROVIDER_SITE_OTHER): Payer: Medicaid Other | Admitting: Cardiothoracic Surgery

## 2019-02-01 ENCOUNTER — Encounter: Payer: Self-pay | Admitting: Cardiothoracic Surgery

## 2019-02-01 VITALS — BP 145/83 | HR 118 | Temp 97.3°F | Ht 69.0 in | Wt 198.6 lb

## 2019-02-01 DIAGNOSIS — J939 Pneumothorax, unspecified: Secondary | ICD-10-CM

## 2019-02-01 NOTE — Progress Notes (Signed)
  Patient ID: [name], male   DOB: January 26, 1960, 59 y.o.   MRN: [id number]  HISTORY: He returns today in follow-up.  He has no new complaints.  He states that he has been feeling pretty well overall.  He did not request a refill on any of his medications.  His oxygen saturations today were 97% on room air.  He does not smoke.   Vitals:   02/01/19 1007  BP: (!) 145/83  Pulse: (!) 118  Temp: (!) 97.3 F (36.3 C)  SpO2: 97%     EXAM:    Resp: Lungs are clear bilaterally.  No respiratory distress, normal effort. Heart:  Regular without murmurs Abd:  Abdomen is soft, non distended and non tender. No masses are palpable.  There is no rebound and no guarding.  Neurological: Alert and oriented to person, place, and time. Coordination normal.  Skin: Skin is warm and dry. No rash noted. No diaphoretic. No erythema. No pallor.  Psychiatric: Normal mood and affect. Normal behavior. Judgment and thought content normal.    ASSESSMENT: Traumatic right-sided pneumothorax with multiple rib fractures I have independently reviewed his chest x-ray.  I see no evidence of pneumothorax or pleural effusion.   PLAN:   I told Mr. Noll would be happy to see him if he has any further questions or problems.  A follow-up visit was not made at this time.  All of his questions were answered.    Hulda Marin, MD

## 2019-02-01 NOTE — Patient Instructions (Addendum)
Follow-up with our office as needed.  Please call and ask to speak with a nurse if you develop questions or concerns.   Pneumothorax A pneumothorax is commonly called a collapsed lung. It is a condition in which air leaks from a lung and builds up between the thin layer of tissue that covers the lungs (visceral pleura) and the interior wall of the chest cavity (parietal pleura). The air gets trapped outside the lung, between the lung and the chest wall (pleural space). The air takes up space and prevents the lung from fully expanding. This condition sometimes occurs suddenly with no apparent cause. The buildup of air may be small or large. A small pneumothorax may go away on its own. A large pneumothorax will require treatment and hospitalization. What are the causes? This condition may be caused by:  Trauma and injury to the chest wall.  Surgery and other medical procedures.  A complication of an underlying lung problem, especially chronic obstructive pulmonary disease (COPD) or emphysema. Sometimes the cause of this condition is not known. What increases the risk? You are more likely to develop this condition if:  You have an underlying lung problem.  You smoke.  You are 20-40 years old, male, tall, and underweight.  You have a personal or family history of pneumothorax.  You have an eating disorder (anorexia nervosa). This condition can also happen quickly, even in people with no history of lung problems. What are the signs or symptoms? Sometimes a pneumothorax will have no symptoms. When symptoms are present, they can include:  Chest pain.  Shortness of breath.  Increased rate of breathing.  Bluish color to your lips or skin (cyanosis). How is this diagnosed? This condition may be diagnosed by:  A medical history and physical exam.  A chest X-ray, chest CT scan, or ultrasound. How is this treated? Treatment depends on how severe your condition is. The goal of  treatment is to remove the extra air and allow your lung to expand back to its normal size.  For a small pneumothorax: ? No treatment may be needed. ? Extra oxygen is sometimes used to make it go away more quickly.  For a large pneumothorax or a pneumothorax that is causing symptoms, a procedure is done to drain the air from your lungs. To do this, a health care provider may use: ? A needle with a syringe. This is used to suck air from a pleural space where no additional leakage is taking place. ? A chest tube. This is used to suck air where there is ongoing leakage into the pleural space. The chest tube may need to remain in place for several days until the air leak has healed.  In more severe cases, surgery may be needed to repair the damage that is causing the leak.  If you have multiple pneumothorax episodes or have an air leak that will not heal, a procedure called a pleurodesis may be done. A medicine is placed in the pleural space to irritate the tissues around the lung so that the lung will stick to the chest wall, seal any leaks, and stop any buildup of air in that space. If you have an underlying lung problem, severe symptoms, or a large pneumothorax you will usually need to stay in the hospital. Follow these instructions at home: Lifestyle  Do not use any products that contain nicotine or tobacco, such as cigarettes and e-cigarettes. These are major risk factors in pneumothorax. If you need help quitting, ask   your health care provider.  Do not lift anything that is heavier than 10 lb (4.5 kg), or the limit that your health care provider tells you, until he or she says that it is safe.  Avoid activities that take a lot of effort (strenuous) for as long as told by your health care provider.  Return to your normal activities as told by your health care provider. Ask your health care provider what activities are safe for you.  Do not fly in an airplane or scuba dive until your health  care provider says it is okay. General instructions  Take over-the-counter and prescription medicines only as told by your health care provider.  If a cough or pain makes it difficult for you to sleep at night, try sleeping in a semi-upright position in a recliner or by using 2 or 3 pillows.  If you had a chest tube and it was removed, ask your health care provider when you can remove the bandage (dressing). While the dressing is in place, do not allow it to get wet.  Keep all follow-up visits as told by your health care provider. This is important. Contact a health care provider if:  You cough up thick mucus (sputum) that is yellow or green in color.  You were treated with a chest tube, and you have redness, increasing pain, or discharge at the site where it was placed. Get help right away if:  You have increasing chest pain or shortness of breath.  You have a cough that will not go away.  You begin coughing up blood.  You have pain that is getting worse or is not controlled with medicines.  The site where your chest tube was located opens up.  You feel air coming out of the site where the chest tube was placed.  You have a fever or persistent symptoms for more than 2-3 days.  You have a fever and your symptoms suddenly get worse. These symptoms may represent a serious problem that is an emergency. Do not wait to see if the symptoms will go away. Get medical help right away. Call your local emergency services (911 in the [place] Do not drive yourself to the hospital. Summary  A pneumothorax, commonly called a collapsed lung, is a condition in which air leaks from a lung and gets trapped between the lung and the chest wall (pleural space).  The buildup of air may be small or large. A small pneumothorax may go away on its own. A large pneumothorax will require treatment and hospitalization.  Treatment for this condition depends on how severe the pneumothorax is. The goal of  treatment is to remove the extra air and allow the lung to expand back to its normal size. This information is not intended to replace advice given to you by your health care provider. Make sure you discuss any questions you have with your health care provider. Document Revised: 12/09/2016 Document Reviewed: 12/05/2016 [employer] Patient Education  2020 [employer]  

## 2020-04-06 ENCOUNTER — Inpatient Hospital Stay
Admission: EM | Admit: 2020-04-06 | Discharge: 2020-04-09 | DRG: 391 | Disposition: A | Discharge status: Home / Self Care | Payer: Medicaid Other | Attending: Student | Admitting: Student

## 2020-04-06 ENCOUNTER — Emergency Department: Payer: Medicaid Other

## 2020-04-06 DIAGNOSIS — K824 Cholesterolosis of gallbladder: Secondary | ICD-10-CM | POA: Diagnosis present

## 2020-04-06 DIAGNOSIS — R6510 Systemic inflammatory response syndrome (SIRS) of non-infectious origin without acute organ dysfunction: Secondary | ICD-10-CM | POA: Diagnosis present

## 2020-04-06 DIAGNOSIS — I10 Essential (primary) hypertension: Secondary | ICD-10-CM | POA: Diagnosis present

## 2020-04-06 DIAGNOSIS — Z7982 Long term (current) use of aspirin: Secondary | ICD-10-CM

## 2020-04-06 DIAGNOSIS — K3189 Other diseases of stomach and duodenum: Secondary | ICD-10-CM | POA: Diagnosis present

## 2020-04-06 DIAGNOSIS — E872 Acidosis: Secondary | ICD-10-CM | POA: Diagnosis present

## 2020-04-06 DIAGNOSIS — E860 Dehydration: Secondary | ICD-10-CM | POA: Diagnosis present

## 2020-04-06 DIAGNOSIS — E785 Hyperlipidemia, unspecified: Secondary | ICD-10-CM | POA: Diagnosis present

## 2020-04-06 DIAGNOSIS — Z8619 Personal history of other infectious and parasitic diseases: Secondary | ICD-10-CM

## 2020-04-06 DIAGNOSIS — K760 Fatty (change of) liver, not elsewhere classified: Secondary | ICD-10-CM | POA: Diagnosis present

## 2020-04-06 DIAGNOSIS — Z20822 Contact with and (suspected) exposure to covid-19: Secondary | ICD-10-CM | POA: Diagnosis present

## 2020-04-06 DIAGNOSIS — K2980 Duodenitis without bleeding: Secondary | ICD-10-CM | POA: Diagnosis present

## 2020-04-06 DIAGNOSIS — Z79899 Other long term (current) drug therapy: Secondary | ICD-10-CM

## 2020-04-06 DIAGNOSIS — F319 Bipolar disorder, unspecified: Secondary | ICD-10-CM | POA: Diagnosis not present

## 2020-04-06 DIAGNOSIS — M170 Bilateral primary osteoarthritis of knee: Secondary | ICD-10-CM | POA: Diagnosis present

## 2020-04-06 DIAGNOSIS — F3130 Bipolar disorder, current episode depressed, mild or moderate severity, unspecified: Secondary | ICD-10-CM | POA: Diagnosis present

## 2020-04-06 DIAGNOSIS — R7989 Other specified abnormal findings of blood chemistry: Secondary | ICD-10-CM | POA: Diagnosis present

## 2020-04-06 DIAGNOSIS — Z818 Family history of other mental and behavioral disorders: Secondary | ICD-10-CM

## 2020-04-06 DIAGNOSIS — Z87442 Personal history of urinary calculi: Secondary | ICD-10-CM

## 2020-04-06 DIAGNOSIS — I25119 Atherosclerotic heart disease of native coronary artery with unspecified angina pectoris: Secondary | ICD-10-CM | POA: Diagnosis present

## 2020-04-06 DIAGNOSIS — R1011 Right upper quadrant pain: Secondary | ICD-10-CM | POA: Diagnosis present

## 2020-04-06 DIAGNOSIS — N179 Acute kidney failure, unspecified: Secondary | ICD-10-CM | POA: Diagnosis present

## 2020-04-06 DIAGNOSIS — D649 Anemia, unspecified: Secondary | ICD-10-CM | POA: Diagnosis present

## 2020-04-06 DIAGNOSIS — Z96653 Presence of artificial knee joint, bilateral: Secondary | ICD-10-CM | POA: Diagnosis present

## 2020-04-06 DIAGNOSIS — I25118 Atherosclerotic heart disease of native coronary artery with other forms of angina pectoris: Secondary | ICD-10-CM | POA: Diagnosis present

## 2020-04-06 DIAGNOSIS — K529 Noninfective gastroenteritis and colitis, unspecified: Principal | ICD-10-CM | POA: Diagnosis present

## 2020-04-06 DIAGNOSIS — K2100 Gastro-esophageal reflux disease with esophagitis, without bleeding: Secondary | ICD-10-CM | POA: Diagnosis present

## 2020-04-06 DIAGNOSIS — K862 Cyst of pancreas: Secondary | ICD-10-CM | POA: Diagnosis present

## 2020-04-06 DIAGNOSIS — R109 Unspecified abdominal pain: Secondary | ICD-10-CM | POA: Diagnosis present

## 2020-04-06 DIAGNOSIS — K859 Acute pancreatitis without necrosis or infection, unspecified: Secondary | ICD-10-CM | POA: Diagnosis present

## 2020-04-06 DIAGNOSIS — N2889 Other specified disorders of kidney and ureter: Secondary | ICD-10-CM | POA: Diagnosis present

## 2020-04-06 DIAGNOSIS — K429 Umbilical hernia without obstruction or gangrene: Secondary | ICD-10-CM | POA: Diagnosis present

## 2020-04-06 DIAGNOSIS — M19042 Primary osteoarthritis, left hand: Secondary | ICD-10-CM | POA: Diagnosis present

## 2020-04-06 DIAGNOSIS — R1084 Generalized abdominal pain: Secondary | ICD-10-CM | POA: Diagnosis not present

## 2020-04-06 DIAGNOSIS — Z87891 Personal history of nicotine dependence: Secondary | ICD-10-CM

## 2020-04-06 DIAGNOSIS — G2581 Restless legs syndrome: Secondary | ICD-10-CM | POA: Diagnosis present

## 2020-04-06 DIAGNOSIS — K219 Gastro-esophageal reflux disease without esophagitis: Secondary | ICD-10-CM | POA: Diagnosis present

## 2020-04-06 DIAGNOSIS — M19041 Primary osteoarthritis, right hand: Secondary | ICD-10-CM | POA: Diagnosis present

## 2020-04-06 DIAGNOSIS — K2970 Gastritis, unspecified, without bleeding: Secondary | ICD-10-CM | POA: Diagnosis present

## 2020-04-06 DIAGNOSIS — Z886 Allergy status to analgesic agent status: Secondary | ICD-10-CM

## 2020-04-06 LAB — CBC WITH DIFFERENTIAL/PLATELET
Abs Immature Granulocytes: 0.05 10*3/uL (ref 0.00–0.07)
Basophils Absolute: 0.1 10*3/uL (ref 0.0–0.1)
Basophils Relative: 1 %
Eosinophils Absolute: 0 10*3/uL (ref 0.0–0.5)
Eosinophils Relative: 0 %
HCT: 54.1 % — ABNORMAL HIGH (ref 39.0–52.0)
Hemoglobin: 18.8 g/dL — ABNORMAL HIGH (ref 13.0–17.0)
Immature Granulocytes: 0 %
Lymphocytes Relative: 22 %
Lymphs Abs: 2.9 10*3/uL (ref 0.7–4.0)
MCH: 30 pg (ref 26.0–34.0)
MCHC: 34.8 g/dL (ref 30.0–36.0)
MCV: 86.4 fL (ref 80.0–100.0)
Monocytes Absolute: 0.8 10*3/uL (ref 0.1–1.0)
Monocytes Relative: 6 %
Neutro Abs: 9.2 10*3/uL — ABNORMAL HIGH (ref 1.7–7.7)
Neutrophils Relative %: 71 %
Platelets: 400 10*3/uL (ref 150–400)
RBC: 6.26 MIL/uL — ABNORMAL HIGH (ref 4.22–5.81)
RDW: 14.6 % (ref 11.5–15.5)
WBC: 13 10*3/uL — ABNORMAL HIGH (ref 4.0–10.5)
nRBC: 0 % (ref 0.0–0.2)

## 2020-04-06 LAB — PROTIME-INR
INR: 1.2 (ref 0.8–1.2)
Prothrombin Time: 14.3 seconds (ref 11.4–15.2)

## 2020-04-06 LAB — COMPREHENSIVE METABOLIC PANEL
ALT: 23 U/L (ref 0–44)
AST: 22 U/L (ref 15–41)
Albumin: 5.7 g/dL — ABNORMAL HIGH (ref 3.5–5.0)
Alkaline Phosphatase: 88 U/L (ref 38–126)
Anion gap: 19 — ABNORMAL HIGH (ref 5–15)
BUN: 24 mg/dL — ABNORMAL HIGH (ref 6–20)
CO2: 22 mmol/L (ref 22–32)
Calcium: 10.9 mg/dL — ABNORMAL HIGH (ref 8.9–10.3)
Chloride: 94 mmol/L — ABNORMAL LOW (ref 98–111)
Creatinine, Ser: 1.33 mg/dL — ABNORMAL HIGH (ref 0.61–1.24)
GFR, Estimated: >60 mL/min (ref >60)
Glucose, Bld: 122 mg/dL — ABNORMAL HIGH (ref 70–99)
Potassium: 3.7 mmol/L (ref 3.5–5.1)
Sodium: 135 mmol/L (ref 135–145)
Total Bilirubin: 1.2 mg/dL (ref 0.3–1.2)
Total Protein: 10.2 g/dL — ABNORMAL HIGH (ref 6.5–8.1)

## 2020-04-06 LAB — CK: Total CK: 113 U/L (ref 49–397)

## 2020-04-06 LAB — LACTIC ACID, PLASMA: Lactic Acid, Venous: 2.8 mmol/L (ref 0.5–1.9)

## 2020-04-06 LAB — SALICYLATE LEVEL: Salicylate Lvl: <7 mg/dL — ABNORMAL LOW (ref 7.0–30.0)

## 2020-04-06 LAB — LIPASE, BLOOD: Lipase: 111 U/L — ABNORMAL HIGH (ref 11–51)

## 2020-04-06 LAB — APTT: aPTT: 29 seconds (ref 24–36)

## 2020-04-06 MED ORDER — HEPARIN BOLUS VIA INFUSION
6000.0000 [IU] | Freq: Once | INTRAVENOUS | Status: AC
  Administered 2020-04-07: 6000 [IU] via INTRAVENOUS
  Filled 2020-04-06: qty 6000

## 2020-04-06 MED ORDER — SODIUM CHLORIDE 0.9 % IV BOLUS
1000.0000 mL | Freq: Once | INTRAVENOUS | Status: AC
  Administered 2020-04-06: 1000 mL via INTRAVENOUS

## 2020-04-06 MED ORDER — ONDANSETRON HCL 4 MG/2ML IJ SOLN
4.0000 mg | Freq: Once | INTRAMUSCULAR | Status: AC
  Administered 2020-04-06: 4 mg via INTRAVENOUS
  Filled 2020-04-06: qty 2

## 2020-04-06 MED ORDER — MORPHINE SULFATE (PF) 4 MG/ML IV SOLN
4.0000 mg | Freq: Once | INTRAVENOUS | Status: AC
  Administered 2020-04-06: 4 mg via INTRAVENOUS
  Filled 2020-04-06: qty 1

## 2020-04-06 MED ORDER — IOHEXOL 300 MG/ML  SOLN
100.0000 mL | Freq: Once | INTRAMUSCULAR | Status: AC | PRN
  Administered 2020-04-06: 100 mL via INTRAVENOUS

## 2020-04-06 MED ORDER — DEXTROSE IN LACTATED RINGERS 5 % IV SOLN: INTRAVENOUS | Status: DC

## 2020-04-06 MED ORDER — MORPHINE SULFATE (PF) 4 MG/ML IV SOLN
4.0000 mg | Freq: Once | INTRAVENOUS | Status: AC
Start: 2020-04-06 — End: 2020-04-06
  Administered 2020-04-06: 4 mg via INTRAVENOUS
  Filled 2020-04-06: qty 1

## 2020-04-06 MED ORDER — ONDANSETRON HCL 4 MG PO TABS: 4.0000 mg | ORAL_TABLET | Freq: Four times a day (QID) | ORAL | Status: DC | PRN

## 2020-04-06 MED ORDER — ACETAMINOPHEN 325 MG PO TABS: 650.0000 mg | ORAL_TABLET | Freq: Four times a day (QID) | ORAL | Status: DC | PRN

## 2020-04-06 MED ORDER — ACETAMINOPHEN 650 MG RE SUPP: 650.0000 mg | Freq: Four times a day (QID) | RECTAL | Status: DC | PRN

## 2020-04-06 MED ORDER — MORPHINE SULFATE (PF) 2 MG/ML IV SOLN
2.0000 mg | INTRAVENOUS | Status: DC | PRN
  Administered 2020-04-07 – 2020-04-09 (×14): 2 mg via INTRAVENOUS
  Filled 2020-04-06 (×14): qty 1

## 2020-04-06 MED ORDER — METRONIDAZOLE IN NACL 5-0.79 MG/ML-% IV SOLN
500.0000 mg | Freq: Three times a day (TID) | INTRAVENOUS | Status: DC
  Administered 2020-04-07 – 2020-04-09 (×8): 500 mg via INTRAVENOUS
  Filled 2020-04-06 (×10): qty 100

## 2020-04-06 MED ORDER — HEPARIN (PORCINE) 25000 UT/250ML-% IV SOLN
1500.0000 [IU]/h | INTRAVENOUS | Status: DC
  Administered 2020-04-07: 1500 [IU]/h via INTRAVENOUS
  Filled 2020-04-06: qty 250

## 2020-04-06 MED ORDER — SODIUM CHLORIDE 0.9 % IV BOLUS
500.0000 mL | Freq: Once | INTRAVENOUS | Status: AC
  Administered 2020-04-06: 500 mL via INTRAVENOUS

## 2020-04-06 MED ORDER — CIPROFLOXACIN IN D5W 400 MG/200ML IV SOLN
400.0000 mg | Freq: Two times a day (BID) | INTRAVENOUS | Status: DC
  Administered 2020-04-07 – 2020-04-09 (×6): 400 mg via INTRAVENOUS
  Filled 2020-04-06 (×7): qty 200

## 2020-04-06 MED ORDER — ONDANSETRON HCL 4 MG/2ML IJ SOLN
4.0000 mg | Freq: Four times a day (QID) | INTRAMUSCULAR | Status: DC | PRN
  Administered 2020-04-07 (×4): 4 mg via INTRAVENOUS
  Filled 2020-04-06 (×4): qty 2

## 2020-04-06 NOTE — ED Provider Notes (Signed)
[facility] Emergency Department Provider Note   ____________________________________________   Event Date/Time   First MD Initiated Contact with Patient 04/06/20 2020     (approximate)  I have reviewed the triage vital signs and the nursing notes.   HISTORY  Chief Complaint Nausea (Nausea, vomiting , diarrhea )    HPI [name] is a 60 y.o. male distant history of hepatitis C, reports he was told though that he had "self cured it", depression bipolar disorder, distant 30-year ago history of IV drug use  Patient did have a recent pneumothorax and rib fractures.  Patient reports he 4 days ago started having nausea and vomiting and loose watery stools.  He reports that he is been vomiting almost anything and everything is try to eat or drink and having loose nonbloody stool.  Also associated with this is a moderate pain in his mid and right upper abdomen.  No pain in his back no pain or discomfort with urination noted  Denies chest pain.  There is no trouble breathing.  No shortness of breath.  No cough no fever.  Patient does report is feeling dehydrated and very nauseated   Past Medical History:  Diagnosis Date  . Anxiety   . Arthritis    knees and hands  . Bipolar disorder (HCC)   . Depression   . GERD (gastroesophageal reflux disease)   . Hepatitis    HEP "C"  . History of kidney stones   . Hypertension   . 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 Kidney stones   . Pericarditis 05/2015   a. echo 5/17: EF 60-65%, no RWMA, LV dias fxn nl, LA mildly dilated, RV sys fxn nl, PASP nl, moderate sized circumferential pericardial effusion was identified, 2.12 cm around the LV free wall, <1 cm around the RV free wall. Features were not c/w tamponade physiology  . PTSD (post-traumatic stress disorder)    Witnessed brother's suicide.  Marland Kitchen Restless leg syndrome   . Syncope     Patient Active Problem List   Diagnosis Date Noted  .  Pneumothorax on right 01/21/2019  . Accelerated hypertension   . Bipolar depression (HCC)   . Coronary artery calcification   . Impaired fasting glucose   . Multiple closed fractures of ribs of right side   . 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 Sepsis (HCC) 12/31/2017  . History of esophagogastroduodenoscopy (EGD) 01/12/2016  . Esophagitis 01/12/2016  . Hypokalemia 01/12/2016  . Leukocytosis 01/12/2016  . Nausea and vomiting 01/11/2016  . Odynophagia 01/11/2016  . Abdominal pain 01/06/2016  . Anemia 06/02/2015  . Chest pain with moderate risk for cardiac etiology   . Coronary artery disease involving native coronary artery of native heart with angina pectoris (HCC)   . Hyperlipidemia   . Pleuritic chest pain 06/01/2015  . Pericarditis with effusion 05/13/2015  . Chest pain at rest 05/12/2015  . Dyspnea 05/12/2015  . SIRS (systemic inflammatory response syndrome) (HCC) 05/12/2015  . 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 CYSTOSCOPY WITH URETEROSCOPY AND STENT PLACEMENT    . ESOPHAGOGASTRODUODENOSCOPY N/A 01/11/2016   Procedure: ESOPHAGOGASTRODUODENOSCOPY (EGD);  Surgeon: Charlott Rakes, MD;  Location: [facility] ENDOSCOPY;  Service: Endoscopy;  Laterality: N/A;  . INCISION AND DRAINAGE ABSCESS Left 01/02/2018   Procedure: INCISION AND DRAINAGE LEFT KNEE;  Surgeon: Deeann Saint, MD;  Location: [facility] ORS;  Service: Orthopedics;  Laterality: Left;  . JOINT REPLACEMENT Right    TKR  . KNEE ARTHROSCOPY Right 06/25/2014   Procedure: ARTHROSCOPY KNEE;  Surgeon: Deeann Saint, MD;  Location: [facility] ORS;  Service: Orthopedics;  Laterality: Right;  partial arthroscopic medial menisectomy  . TOTAL KNEE ARTHROPLASTY Right 04/22/2015   Procedure: TOTAL KNEE ARTHROPLASTY;  Surgeon: Deeann Saint, MD;  Location: [facility] ORS;  Service: Orthopedics;  Laterality: Right;  . TOTAL KNEE ARTHROPLASTY Left 10/30/2017   Procedure:  TOTAL KNEE ARTHROPLASTY;  Surgeon: Deeann Saint, MD;  Location: [facility] ORS;  Service: Orthopedics;  Laterality: Left;  . TOTAL KNEE REVISION Left 01/02/2018   Procedure: poly exchange of tibia and patella left knee;  Surgeon: Deeann Saint, MD;  Location: [facility] ORS;  Service: Orthopedics;  Laterality: Left;    Prior to Admission medications   Medication Sig Start Date End Date Taking? Authorizing Provider  aspirin EC 325 MG tablet Take 1 tablet (325 mg total) by mouth daily. 11/03/17   Deeann Saint, MD  divalproex (DEPAKOTE) 500 MG DR tablet Take 500 mg by mouth 2 (two) times daily.    [provider]  docusate sodium (COLACE) 100 MG capsule Take 1 capsule (100 mg total) by mouth 2 (two) times daily. 01/25/19   Donovan Kail, PA-C  FLUoxetine (PROZAC) 40 MG capsule Take 80 mg by mouth daily.     [provider]  methocarbamol (ROBAXIN) 500 MG tablet Take 1 tablet (500 mg total) by mouth every 6 (six) hours. 01/25/19   Donovan Kail, PA-C  Multiple Vitamin (MULTIVITAMIN WITH MINERALS) TABS tablet Take 1 tablet by mouth daily.    [provider]  oxyCODONE 10 MG TABS Take 1 tablet (10 mg total) by mouth every 6 (six) hours as needed for severe pain or breakthrough pain. 01/25/19   Donovan Kail, PA-C  polyethylene glycol (MIRALAX / GLYCOLAX) 17 g packet Take 17 g by mouth daily as needed for mild constipation or moderate constipation. Patient not taking: Reported on 02/01/2019 01/25/19   Donovan Kail, PA-C  QUEtiapine (SEROQUEL) 50 MG tablet Take 50 mg by mouth 4 (four) times daily as needed (for agitation).     [provider]  SEROQUEL XR 400 MG 24 hr tablet Take 400 mg by mouth at bedtime.     [provider]  traMADol (ULTRAM) 50 MG tablet Take 2 tablets (100 mg total) by mouth every 6 (six) hours. 01/25/19   Donovan Kail, PA-C    Allergies Toradol [ketorolac tromethamine]  Family History  Problem Relation Age of Onset  . CVA  Mother        deceased at age 3  . Depression Brother        Died by suicide at age 50    Social History Social History   Tobacco Use  . Smoking status: Former Smoker    Packs/day: 0.75    Years: 20.00    Pack years: 15.00    Types: Cigarettes    Quit date: 05/16/1984    Years since quitting: 35.9  . Smokeless tobacco: Never Used  Substance Use Topics  . Alcohol use: Yes    Comment: occ  . Drug use: No    Review of Systems Constitutional: No fever/chills Eyes: No visual changes. ENT: No sore throat.  Feels dry dehydrated Cardiovascular: Denies chest pain. Respiratory: Denies shortness of breath. Gastrointestinal: See HPI.  Denies previous abdominal surgeries Genitourinary: Negative for dysuria. Musculoskeletal: Negative for back pain. Skin: Negative for rash.  Neurological: Negative for headaches, areas of focal weakness or numbness.    ____________________________________________   PHYSICAL EXAM:  VITAL SIGNS: ED Triage Vitals  Enc Vitals Group     BP 04/06/20 2020 (!) 160/112     Pulse Rate 04/06/20 2020 (!) 110     Resp 04/06/20 2020 17     Temp 04/06/20 2020 98.3 F (36.8 C)     Temp Source 04/06/20 2020 Oral     SpO2 04/06/20 2020 98 %     Weight 04/06/20 2029 187 lb 6.3 oz (85 kg)     Height 04/06/20 2029 5\' 10"  (1.778 m)     Head Circumference --      Peak Flow --      Pain Score 04/06/20 2022 4     Pain Loc --      Pain Edu? --      Excl. in GC? --     Constitutional: Alert and oriented. Well appearing and in no acute distress.  Just pleasant.  Appears mildly ill though noted to be slightly tachycardic Eyes: Conjunctivae are normal. Head: Atraumatic. Nose: No congestion/rhinnorhea. Mouth/Throat: Mucous membranes are moderately dry. Neck: No stridor.  Cardiovascular: Slightly tachycardic rate, regular rhythm. Grossly normal heart sounds.  Good peripheral circulation. Respiratory: Normal respiratory effort.  No retractions. Lungs  CTAB. Gastrointestinal: Mild tenderness throughout, but does report focal increase in tenderness primarily in the right upper quadrant.  Equivocal Murphy.  No obvious focality at McBurney's point.  No distention.  He has a soft reducible umbilical hernia that he reports been present for years and appears to be unchanged.  No groin pain. Musculoskeletal: No lower extremity tenderness nor edema. Neurologic:  Normal speech and language. No gross focal neurologic deficits are appreciated.  Skin:  Skin is warm, dry and intact. No rash noted. Psychiatric: Mood and affect are normal. Speech and behavior are normal.  ____________________________________________   LABS (all labs ordered are listed, but only abnormal results are displayed)  Labs Reviewed  COMPREHENSIVE METABOLIC PANEL - Abnormal; Notable for the following components:      Result Value   Chloride 94 (*)    Glucose, Bld 122 (*)    BUN 24 (*)    Creatinine, Ser 1.33 (*)    Calcium 10.9 (*)    Total Protein 10.2 (*)    Albumin 5.7 (*)    Anion gap 19 (*)    All other components within normal limits  LIPASE, BLOOD - Abnormal; Notable for the following components:   Lipase 111 (*)    All other components within normal limits  CBC WITH DIFFERENTIAL/PLATELET - Abnormal; Notable for the following components:   WBC 13.0 (*)    RBC 6.26 (*)    Hemoglobin 18.8 (*)    HCT 54.1 (*)    Neutro Abs 9.2 (*)    All other components within normal limits  LACTIC ACID, PLASMA - Abnormal; Notable for the following components:   Lactic Acid, Venous 2.8 (*)    All other components within normal limits  CULTURE, BLOOD (SINGLE)  GASTROINTESTINAL PANEL BY PCR, STOOL (REPLACES STOOL CULTURE)  C DIFFICILE QUICK SCREEN W PCR REFLEX  SARS CORONAVIRUS 2 (TAT 6-24 HRS)  CK  URINALYSIS, ROUTINE W REFLEX MICROSCOPIC  ETHANOL  SALICYLATE LEVEL  HIV ANTIBODY (ROUTINE TESTING W REFLEX)  CBC  COMPREHENSIVE METABOLIC PANEL  PROTIME-INR  APTT    ____________________________________________  EKG  No associated chest pain.  ____________________________________________  RADIOLOGY  CT ABDOMEN PELVIS  W CONTRAST  Result Date: 04/06/2020 CLINICAL DATA:  Epigastric abdominal pain, more towards the right side EXAM: CT ABDOMEN AND PELVIS WITH CONTRAST TECHNIQUE: Multidetector CT imaging of the abdomen and pelvis was performed using the standard protocol following bolus administration of intravenous contrast. CONTRAST:  OMNIPAQUE IOHEXOL 300 MG/ML  SOLN COMPARISON:  CT 01/27/2016 FINDINGS: Lower chest: Atelectatic changes present in the otherwise clear lung bases. Normal heart size. No pericardial effusion. Hepatobiliary: Diffuse hepatic hypoattenuation compatible with hepatic steatosis. Stable 6 mm hypoattenuating focus adjacent gallbladder fossa small benign cysts though too small to fully characterize. No concerning new focal liver lesion. Normal gallbladder and biliary tree without visible calcified gallstone or ductal dilatation. Pancreas: No pancreatic ductal dilatation or surrounding inflammatory changes. Spleen: Which shaped region of peripheral hypoattenuation in the inferior right spleen (2/25), could reflect a site of developing splenic infarct. No other focal splenic lesion. Normal splenic size. Adrenals/Urinary Tract: Normal adrenal glands. Kidneys are normally located with symmetric enhancement and excretion. There are several scattered fluid attenuation cysts in both kidneys as well as additional smaller subcentimeter hypodense foci too small to fully characterize on CT imaging but statistically likely benign. A more focal, hyperattenuating focus is seen in the posterior interpolar right kidney measuring up to 11 mm in size, previously 7 mm and given increased from comparison Imaging in 2018 remains worrisome for potential renal malignancy. No other suspicious renal lesion, urolithiasis or hydronephrosis. Urinary bladder is  unremarkable for the degree of distention. Stomach/Bowel: Distal esophagus is free of acute abnormality. Some slight prominence of the rugal folds likely reflecting a combination contrast timing intramural fatty infiltration which could be seen in the setting of chronic or prior inflammatory change. Similar appearance of the duodenum. No other conspicuous bowel wall enhancement thickening. Slightly fluid-filled appearance of the distal small bowel is nonspecific. No colonic dilatation or wall thickening. Scattered colonic diverticula without focal inflammation to suggest diverticulitis. A normal appendix is visualized in the right mid abdomen without significant periappendiceal inflammatory change accounting for some motion artifact, particularly seen multiplanar reconstruction. Vascular/Lymphatic: Atherosclerotic calcifications within the abdominal aorta and branch vessels. No aneurysm or ectasia. Ostial and proximal plaque narrowing the splanchnic arteries is incompletely assessed on this angiographic technique. No enlarged abdominopelvic lymph nodes. Reproductive: The prostate and seminal vesicles are unremarkable. Other: No abdominopelvic free fluid or free gas. No bowel containing hernias. Small fat containing umbilical hernia minimal stranding of the herniated fat contents, correlate for point tenderness to exclude fat strangulation. Musculoskeletal: No acute osseous abnormality or suspicious osseous lesion. Levocurvature of the lumbar spine, apex L3. IMPRESSION: 1. Which shaped region of peripheral hypoattenuation in the inferior spleen, could reflect a site of developing splenic infarct. 2. Slightly fluid-filled appearance of the distal small bowel is nonspecific, but can be seen with enteritis. 3. Some slight prominence of the rugal folds likely reflecting a combination contrast timing intramural fatty infiltration which could be seen in the setting of chronic or prior inflammatory change with a similar  appearance of the duodenum though should assess for clinical features of gastroduodenitis. 4. Small fat containing umbilical hernia with minimal stranding of the herniated fat contents, correlate for point tenderness to exclude fat strangulation. 5. A hyperattenuating focus in the posterior interpolar right kidney measuring up to 11 mm in size, previously 7 mm. Given increased size from comparison Imaging in 2018 remains worrisome for potential renal malignancy. Warrants further evaluation contrast enhanced MR imaging on an outpatient basis when patient is better able to  tolerate the scanning procedure. 6. Hepatic steatosis. 7. Aortic Atherosclerosis (ICD10-I70.0). Electronically Signed   By: Kreg Shropshire M.D.   On: 04/06/2020 22:51   US ABDOMEN LIMITED RUQ (LIVER/GB)  Result Date: 04/06/2020 CLINICAL DATA:  Right upper quadrant pain with nausea, initial encounter EXAM: ULTRASOUND ABDOMEN LIMITED RIGHT UPPER QUADRANT COMPARISON:  None. FINDINGS: Gallbladder: Gallbladder is well distended. A few small polyps are noted measuring 5 mm. No wall thickening or pericholecystic fluid is noted. Common bile duct: Diameter: 4.5 mm. Liver: Generalized increased echogenicity consistent with fatty infiltration. Focal area of fatty sparing is noted adjacent to the gallbladder fossa. Small cyst is noted measuring 1.4 cm within the liver. Portal vein is patent on color Doppler imaging with normal direction of blood flow towards the liver. Other: None. IMPRESSION: Fatty infiltration of the liver. Small gallbladder polyps. Electronically Signed   By: Alcide Clever M.D.   On: 04/06/2020 21:26    Ultrasound right upper quadrant negative for acute finding, possibly fatty liver infiltration. CT abdomen pelvis reviewed multiple findings, please see complete detail from radiologist.  Of notable there is concern for possible splenic infarct.  Additionally fluid-filled distal small bowel nonspecific possibly enteritis.  Multiple other  findings as detailed above.  Small fat-containing umbilical hernia with possibly some minimal stranding, but on exam this is nontender no erythema and is reducible patient reports that the same as it has appeared in been for several years. ____________________________________________   PROCEDURES  Procedure(s) performed: None  Procedures  Critical Care performed: No  ____________________________________________   INITIAL IMPRESSION / ASSESSMENT AND PLAN / ED COURSE  Pertinent labs & imaging results that were available during my care of the patient were reviewed by me and considered in my medical decision making (see chart for details).    Differential diagnosis includes but is not limited to, abdominal perforation, aortic dissection, cholecystitis, appendicitis, diverticulitis, colitis, esophagitis/gastritis, kidney stone, pyelonephritis, urinary tract infection, aortic aneurysm. All are considered in decision and treatment plan. Based upon the patient's presentation and risk factors, and what appears to be more focality of pain in the right upper quadrant will elect first to evaluate with right upper quadrant ultrasound for pathology such as acute cholecystitis, liver abnormalities choledocholithiasis etc.  All labs are pending at this point.  If his right upper quadrant is unrevealing of acute pathology I would anticipate proceeding to CT scan for additional radiologic evaluation.  He is hemodynamically stable although tachycardic, will resuscitate with fluid provide pain relief antiemetics.  Also check stool cultures if available.   Clinical Course as of 04/06/20 2320  Mon Apr 06, 2020  2131 Lactic Acid, Venous(!!): 2.8 [MQ]  2131 Lipase(!): 111 [MQ]  2131 WBC(!): 13.0 [MQ]  2131 Hemoglobin(!): 18.8 [MQ]  2131 Creatinine(!): 1.33 [MQ]  2131 Glucose(!): 122 [MQ]  2131 Calcium(!): 10.9 Multiple laboratory abnormalities. [MQ]  2131 US ABDOMEN LIMITED RUQ (LIVER/GB) [MQ]  2131  Fatty infiltration of the liver on ultrasound reviewed by me.  No evidence of acute cholecystitis though.  We will proceed with CT imaging for further work-up. [MQ]    Clinical Course User Index [MQ] Sharyn Creamer, MD    ----------------------------------------- 11:20 PM on 04/06/2020 -----------------------------------------  Discussed with the patient, moderate pain relief still fully awake and alert, reports pain ongoing and still mild but about half is much nausea.  Will redose morphine and Zofran.  Discussed admission with Dr. Mikeal Hawthorne who is excepted the patient for further work-up of his abdominal pain at this point and  concerning finding of a possible splenic infarct of unknown etiology. ____________________________________________   FINAL CLINICAL IMPRESSION(S) / ED DIAGNOSES  Final diagnoses:  Abdominal pain  Enteritis, possible splenic infarct, incidental findings on CT abdomen pelvis Intractable nausea and abdominal pain      Note:  This document was prepared using Dragon voice recognition software and may include unintentional dictation errors       Sharyn Creamer, MD 04/06/20 2320

## 2020-04-06 NOTE — ED Triage Notes (Signed)
Nausea, vomiting diarrhea x 4 days  

## 2020-04-06 NOTE — H&P (Signed)
History and Physical   [name]:[id number] DOB: 1960-06-01 DOA: 04/06/2020  Referring MD/NP/PA: Niel Hummer  PCP: [facility]   Outpatient Specialists: None  Patient coming from: Home  Chief Complaint: Abdominal pain nausea vomiting  HPI: [name] is a 60 y.o. male with medical history significant of hepatitis C, prior history of IV drug abuse, bipolar disorder, depression, GERD, coronary artery disease with previous pericarditis history who presented to the ER with 4 days of persistent nausea vomiting abdominal pain.  Also watery stools.  Patient has been unable to keep anything down.  Denied any melena denied any bright red blood per rectum.Pain is much ass 9 out of 10.  Not relieved by anything.  Worsened by activity and fluid.  He denied any significant fever.  Patient seen and evaluated in the ER.  He has diffuse tenderness.  Evaluation showed with CT showed findings suggestive of splenic infarct, possible early incarcerated hernia as well as increased size of possible renal mass.  Patient being admitted with abdominal pain for further evaluation..  ED Course: Temperature is 98.3 blood pressure 160/112 pulse 110 respiratory oxygen sat 94% room air.  His white count is 13.0 hemoglobin 10.8 and platelets 400.  Lactic acid 2.8 glucose 122.  BUN is 24 creatinine 1.33 and calcium 10.9.  COVID-19 screen is currently pending.  Abdominal ultrasound shows fatty infiltration of the liver and small gallbladder polyps.  CT abdomen pelvis shows possible inferior developing splenic infarct, possible enteritis, possible gastroduodenitis, small fat-containing umbilical hernia as well as slightly increased in size of right kidney lesion about 11 mm.  Patient therefore is being admitted for further evaluation of his abdominal pain.  Review of Systems: As per HPI otherwise 10 point review of systems negative.    Past Medical History:  Diagnosis Date  . Anxiety   . Arthritis     knees and hands  . Bipolar disorder (HCC)   . Depression   . GERD (gastroesophageal reflux disease)   . Hepatitis    HEP "C"  . History of kidney stones   . Hypertension   . 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 Kidney stones   . Pericarditis 05/2015   a. echo 5/17: EF 60-65%, no RWMA, LV dias fxn nl, LA mildly dilated, RV sys fxn nl, PASP nl, moderate sized circumferential pericardial effusion was identified, 2.12 cm around the LV free wall, <1 cm around the RV free wall. Features were not c/w tamponade physiology  . PTSD (post-traumatic stress disorder)    Witnessed brother's suicide.  Marland Kitchen Restless leg syndrome   . Syncope     Past Surgical History:  Procedure Laterality Date  . CYSTOSCOPY WITH URETEROSCOPY AND STENT PLACEMENT    . ESOPHAGOGASTRODUODENOSCOPY N/A 01/11/2016   Procedure: ESOPHAGOGASTRODUODENOSCOPY (EGD);  Surgeon: Charlott Rakes, MD;  Location: [facility] ENDOSCOPY;  Service: Endoscopy;  Laterality: N/A;  . INCISION AND DRAINAGE ABSCESS Left 01/02/2018   Procedure: INCISION AND DRAINAGE LEFT KNEE;  Surgeon: Deeann Saint, MD;  Location: [facility] ORS;  Service: Orthopedics;  Laterality: Left;  . JOINT REPLACEMENT Right    TKR  . KNEE ARTHROSCOPY Right 06/25/2014   Procedure: ARTHROSCOPY KNEE;  Surgeon: Deeann Saint, MD;  Location: [facility] ORS;  Service: Orthopedics;  Laterality: Right;  partial arthroscopic medial menisectomy  . TOTAL KNEE ARTHROPLASTY Right 04/22/2015   Procedure: TOTAL KNEE ARTHROPLASTY;  Surgeon: Deeann Saint, MD;  Location: [facility] ORS;  Service: Orthopedics;  Laterality: Right;  . TOTAL  KNEE ARTHROPLASTY Left 10/30/2017   Procedure: TOTAL KNEE ARTHROPLASTY;  Surgeon: Deeann Saint, MD;  Location: [facility] ORS;  Service: Orthopedics;  Laterality: Left;  . TOTAL KNEE REVISION Left 01/02/2018   Procedure: poly exchange of tibia and patella left knee;  Surgeon: Deeann Saint, MD;  Location: [facility] ORS;  Service: Orthopedics;  Laterality: Left;      reports that he quit smoking about 35 years ago. His smoking use included cigarettes. He has a 15.00 pack-year smoking history. He has never used smokeless tobacco. He reports current alcohol use. He reports that he does not use drugs.  Allergies  Allergen Reactions  . Toradol [Ketorolac Tromethamine] Hives    Family History  Problem Relation Age of Onset  . CVA Mother        deceased at age 36  . Depression Brother        Died by suicide at age 69     Prior to Admission medications   Medication Sig Start Date End Date Taking? Authorizing Provider  aspirin EC 325 MG tablet Take 1 tablet (325 mg total) by mouth daily. 11/03/17   Deeann Saint, MD  divalproex (DEPAKOTE) 500 MG DR tablet Take 500 mg by mouth 2 (two) times daily.    [provider]  docusate sodium (COLACE) 100 MG capsule Take 1 capsule (100 mg total) by mouth 2 (two) times daily. 01/25/19   Donovan Kail, PA-C  FLUoxetine (PROZAC) 40 MG capsule Take 80 mg by mouth daily.     [provider]  methocarbamol (ROBAXIN) 500 MG tablet Take 1 tablet (500 mg total) by mouth every 6 (six) hours. 01/25/19   Donovan Kail, PA-C  Multiple Vitamin (MULTIVITAMIN WITH MINERALS) TABS tablet Take 1 tablet by mouth daily.    [provider]  oxyCODONE 10 MG TABS Take 1 tablet (10 mg total) by mouth every 6 (six) hours as needed for severe pain or breakthrough pain. 01/25/19   Donovan Kail, PA-C  polyethylene glycol (MIRALAX / GLYCOLAX) 17 g packet Take 17 g by mouth daily as needed for mild constipation or moderate constipation. Patient not taking: Reported on 02/01/2019 01/25/19   Donovan Kail, PA-C  QUEtiapine (SEROQUEL) 50 MG tablet Take 50 mg by mouth 4 (four) times daily as needed (for agitation).     [provider]  SEROQUEL XR 400 MG 24 hr tablet Take 400 mg by mouth at bedtime.     [provider]  traMADol (ULTRAM) 50 MG tablet Take 2 tablets (100 mg total) by mouth  every 6 (six) hours. 01/25/19   Donovan Kail, PA-C    Physical Exam: Vitals:   04/06/20 2020 04/06/20 2029 04/06/20 2123 04/06/20 2225  BP: (!) 160/112  (!) 155/95 (!) 154/94  Pulse: (!) 110  (!) 105 87  Resp: 17  17 12   Temp: 98.3 F (36.8 C)     TempSrc: Oral     SpO2: 98%  98% 94%  Weight:  85 kg    Height:  5\' 10"  (1.778 m)        Constitutional: Acutely ill looking, in mild distress Vitals:   04/06/20 2020 04/06/20 2029 04/06/20 2123 04/06/20 2225  BP: (!) 160/112  (!) 155/95 (!) 154/94  Pulse: (!) 110  (!) 105 87  Resp: 17  17 12   Temp: 98.3 F (36.8 C)     TempSrc: Oral     SpO2: 98%  98% 94%  Weight:  85 kg  Height:  5\' 10"  (1.778 m)     Eyes: PERRL, lids and conjunctivae normal ENMT: Mucous membranes are dry. Posterior pharynx clear of any exudate or lesions.Normal dentition.  Neck: normal, supple, no masses, no thyromegaly Respiratory: clear to auscultation bilaterally, no wheezing, no crackles. Normal respiratory effort. No accessory muscle use.  Cardiovascular: Sinus tachycardia, no murmurs / rubs / gallops. No extremity edema. 2+ pedal pulses. No carotid bruits.  Abdomen: Diffuse tenderness, no masses palpated. No hepatosplenomegaly. Bowel sounds positive.  Musculoskeletal: no clubbing / cyanosis. No joint deformity upper and lower extremities. Good ROM, no contractures. Normal muscle tone.  Skin: no rashes, lesions, ulcers. No induration Neurologic: CN 2-12 grossly intact. Sensation intact, DTR normal. Strength 5/5 in all 4.  Psychiatric: Normal judgment and insight. Alert and oriented x 3. Normal mood.     Labs on Admission: I have personally reviewed following labs and imaging studies  CBC: Recent Labs  Lab 04/06/20 2037  WBC 13.0*  NEUTROABS 9.2*  HGB 18.8*  HCT 54.1*  MCV 86.4  PLT 400   Basic Metabolic Panel: Recent Labs  Lab 04/06/20 2037  NA 135  K 3.7  CL 94*  CO2 22  GLUCOSE 122*  BUN 24*  CREATININE 1.33*  CALCIUM  10.9*   GFR: Estimated Creatinine Clearance: 63.3 mL/min (A) (by C-G formula based on SCr of 1.33 mg/dL (H)). Liver Function Tests: Recent Labs  Lab 04/06/20 2037  AST 22  ALT 23  ALKPHOS 88  BILITOT 1.2  PROT 10.2*  ALBUMIN 5.7*   Recent Labs  Lab 04/06/20 2037  LIPASE 111*   No results for input(s): AMMONIA in the last 168 hours. Coagulation Profile: No results for input(s): INR, PROTIME in the last 168 hours. Cardiac Enzymes: Recent Labs  Lab 04/06/20 2037  CKTOTAL 113   BNP (last 3 results) No results for input(s): PROBNP in the last 8760 hours. HbA1C: No results for input(s): HGBA1C in the last 72 hours. CBG: No results for input(s): GLUCAP in the last 168 hours. Lipid Profile: No results for input(s): CHOL, HDL, LDLCALC, TRIG, CHOLHDL, LDLDIRECT in the last 72 hours. Thyroid Function Tests: No results for input(s): TSH, T4TOTAL, FREET4, T3FREE, THYROIDAB in the last 72 hours. Anemia Panel: No results for input(s): VITAMINB12, FOLATE, FERRITIN, TIBC, IRON, RETICCTPCT in the last 72 hours. Urine analysis:    Component Value Date/Time   COLORURINE COLORLESS (A) 10/26/2017 0806   APPEARANCEUR CLEAR (A) 10/26/2017 0806   LABSPEC 1.000 (L) 10/26/2017 0806   PHURINE 7.0 10/26/2017 0806   GLUCOSEU NEGATIVE 10/26/2017 0806   HGBUR NEGATIVE 10/26/2017 0806   BILIRUBINUR NEGATIVE 10/26/2017 0806   KETONESUR NEGATIVE 10/26/2017 0806   PROTEINUR NEGATIVE 10/26/2017 0806   NITRITE NEGATIVE 10/26/2017 0806   LEUKOCYTESUR NEGATIVE 10/26/2017 0806   Sepsis Labs: @LABRCNTIP (procalcitonin:4,lacticidven:4) )No results found for this or any previous visit (from the past 240 hour(s)).   Radiological Exams on Admission: CT ABDOMEN PELVIS W CONTRAST  Result Date: 04/06/2020 CLINICAL DATA:  Epigastric abdominal pain, more towards the right side EXAM: CT ABDOMEN AND PELVIS WITH CONTRAST TECHNIQUE: Multidetector CT imaging of the abdomen and pelvis was performed using the  standard protocol following bolus administration of intravenous contrast. CONTRAST:  OMNIPAQUE IOHEXOL 300 MG/ML  SOLN COMPARISON:  CT 01/27/2016 FINDINGS: Lower chest: Atelectatic changes present in the otherwise clear lung bases. Normal heart size. No pericardial effusion. Hepatobiliary: Diffuse hepatic hypoattenuation compatible with hepatic steatosis. Stable 6 mm hypoattenuating focus adjacent gallbladder fossa small benign  cysts though too small to fully characterize. No concerning new focal liver lesion. Normal gallbladder and biliary tree without visible calcified gallstone or ductal dilatation. Pancreas: No pancreatic ductal dilatation or surrounding inflammatory changes. Spleen: Which shaped region of peripheral hypoattenuation in the inferior right spleen (2/25), could reflect a site of developing splenic infarct. No other focal splenic lesion. Normal splenic size. Adrenals/Urinary Tract: Normal adrenal glands. Kidneys are normally located with symmetric enhancement and excretion. There are several scattered fluid attenuation cysts in both kidneys as well as additional smaller subcentimeter hypodense foci too small to fully characterize on CT imaging but statistically likely benign. A more focal, hyperattenuating focus is seen in the posterior interpolar right kidney measuring up to 11 mm in size, previously 7 mm and given increased from comparison Imaging in 2018 remains worrisome for potential renal malignancy. No other suspicious renal lesion, urolithiasis or hydronephrosis. Urinary bladder is unremarkable for the degree of distention. Stomach/Bowel: Distal esophagus is free of acute abnormality. Some slight prominence of the rugal folds likely reflecting a combination contrast timing intramural fatty infiltration which could be seen in the setting of chronic or prior inflammatory change. Similar appearance of the duodenum. No other conspicuous bowel wall enhancement thickening. Slightly  fluid-filled appearance of the distal small bowel is nonspecific. No colonic dilatation or wall thickening. Scattered colonic diverticula without focal inflammation to suggest diverticulitis. A normal appendix is visualized in the right mid abdomen without significant periappendiceal inflammatory change accounting for some motion artifact, particularly seen multiplanar reconstruction. Vascular/Lymphatic: Atherosclerotic calcifications within the abdominal aorta and branch vessels. No aneurysm or ectasia. Ostial and proximal plaque narrowing the splanchnic arteries is incompletely assessed on this angiographic technique. No enlarged abdominopelvic lymph nodes. Reproductive: The prostate and seminal vesicles are unremarkable. Other: No abdominopelvic free fluid or free gas. No bowel containing hernias. Small fat containing umbilical hernia minimal stranding of the herniated fat contents, correlate for point tenderness to exclude fat strangulation. Musculoskeletal: No acute osseous abnormality or suspicious osseous lesion. Levocurvature of the lumbar spine, apex L3. IMPRESSION: 1. Which shaped region of peripheral hypoattenuation in the inferior spleen, could reflect a site of developing splenic infarct. 2. Slightly fluid-filled appearance of the distal small bowel is nonspecific, but can be seen with enteritis. 3. Some slight prominence of the rugal folds likely reflecting a combination contrast timing intramural fatty infiltration which could be seen in the setting of chronic or prior inflammatory change with a similar appearance of the duodenum though should assess for clinical features of gastroduodenitis. 4. Small fat containing umbilical hernia with minimal stranding of the herniated fat contents, correlate for point tenderness to exclude fat strangulation. 5. A hyperattenuating focus in the posterior interpolar right kidney measuring up to 11 mm in size, previously 7 mm. Given increased size from comparison  Imaging in 2018 remains worrisome for potential renal malignancy. Warrants further evaluation contrast enhanced MR imaging on an outpatient basis when patient is better able to tolerate the scanning procedure. 6. Hepatic steatosis. 7. Aortic Atherosclerosis (ICD10-I70.0). Electronically Signed   By: Kreg Shropshire M.D.   On: 04/06/2020 22:51   US ABDOMEN LIMITED RUQ (LIVER/GB)  Result Date: 04/06/2020 CLINICAL DATA:  Right upper quadrant pain with nausea, initial encounter EXAM: ULTRASOUND ABDOMEN LIMITED RIGHT UPPER QUADRANT COMPARISON:  None. FINDINGS: Gallbladder: Gallbladder is well distended. A few small polyps are noted measuring 5 mm. No wall thickening or pericholecystic fluid is noted. Common bile duct: Diameter: 4.5 mm. Liver: Generalized increased echogenicity consistent with fatty infiltration.  Focal area of fatty sparing is noted adjacent to the gallbladder fossa. Small cyst is noted measuring 1.4 cm within the liver. Portal vein is patent on color Doppler imaging with normal direction of blood flow towards the liver. Other: None. IMPRESSION: Fatty infiltration of the liver. Small gallbladder polyps. Electronically Signed   By: Alcide CleverMark  Lukens M.D.   On: 04/06/2020 21:26      Assessment/Plan Principal Problem:   Abdominal pain Active Problems:   SIRS (systemic inflammatory response syndrome) (HCC)   Coronary artery disease involving native coronary artery of native heart with angina pectoris (HCC)   Hyperlipidemia   Accelerated hypertension   Bipolar depression (HCC)     #1 abdominal pain: Suspicious for splenic infarct may be other pathology.  At this point patient will be admitted.  I will initiate heparin drip for possible infarct well evaluated patient.  We will get MRI of the abdomen.  Patient will also get treated for possible enteritis and duodenitis.  IV Protonix as well as empiric antibiotics.  GI consultation may be started after MRI.  Pain control and nausea control.  IV  fluids.  Anticoagulation can be stopped if no evidence of splenic infarct from MRI.  #2 coronary artery disease: Due to this patient is at risk for possible infarct.  EKG to look for any arrhythmias.  Continue monitoring  #3 hypertension: Confirm and resume home regimen when oral intake returns.  #4 hyperlipidemia: Confirm and resume statin  #5 SIRS: Patient meets criteria for SIRS.  Supportive care only  #6 bipolar disorder: We will confirm and resume home regimen when necessary     DVT prophylaxis: Heparin drip Code Status: Full code Family Communication: No family at bedside Disposition Plan: Home Consults called: None but may require GI consult in the morning Admission status: Inpatient  Severity of Illness: The appropriate patient status for this patient is INPATIENT. Inpatient status is judged to be reasonable and necessary in order to provide the required intensity of service to ensure the patient's safety. The patient's presenting symptoms, physical exam findings, and initial radiographic and laboratory data in the context of their chronic comorbidities is felt to place them at high risk for further clinical deterioration. Furthermore, it is not anticipated that the patient will be medically stable for discharge from the hospital within 2 midnights of admission. The following factors support the patient status of inpatient.   " The patient's presenting symptoms include abdominal pain nausea vomiting. " The worrisome physical exam findings include diffuse abdominal tenderness. " The initial radiographic and laboratory data are worrisome because of evidence of SIRS. " The chronic co-morbidities include bipolar disorder.   * I certify that at the point of admission it is my clinical judgment that the patient will require inpatient hospital care spanning beyond 2 midnights from the point of admission due to high intensity of service, high risk for further deterioration and high  frequency of surveillance required.Lonia Blood*    Azhia Siefken,LAWAL MD [facility] Pager [phone]  If 7PM-7AM, please contact night-coverage [email or web address] Password [email or web address]  04/06/2020, 11:16 PM

## 2020-04-06 NOTE — Progress Notes (Signed)
ANTICOAGULATION CONSULT NOTE - Initial Consult  Pharmacy Consult for Heparin Indication: DVT  Allergies  Allergen Reactions  . Toradol [Ketorolac Tromethamine] Hives    Patient Measurements: Height: 5\' 10"  (177.8 cm) Weight: 85 kg (187 lb 6.3 oz) IBW/kg (Calculated) : 73 Heparin Dosing Weight: 85 kg   Vital Signs: Temp: 98.3 F (36.8 C) (03/28 2020) Temp Source: Oral (03/28 2020) BP: 154/94 (03/28 2225) Pulse Rate: 87 (03/28 2225)  Labs: Recent Labs    04/06/20 2037  HGB 18.8*  HCT 54.1*  PLT 400  CREATININE 1.33*  CKTOTAL 113    Estimated Creatinine Clearance: 63.3 mL/min (A) (by C-G formula based on SCr of 1.33 mg/dL (H)).   Medical History: Past Medical History:  Diagnosis Date  . Anxiety   . Arthritis    knees and hands  . Bipolar disorder (HCC)   . Depression   . GERD (gastroesophageal reflux disease)   . Hepatitis    HEP "C"  . History of kidney stones   . Hypertension   . 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 Kidney stones   . Pericarditis 05/2015   a. echo 5/17: EF 60-65%, no RWMA, LV dias fxn nl, LA mildly dilated, RV sys fxn nl, PASP nl, moderate sized circumferential pericardial effusion was identified, 2.12 cm around the LV free wall, <1 cm around the RV free wall. Features were not c/w tamponade physiology  . PTSD (post-traumatic stress disorder)    Witnessed brother's suicide.  6/17 Restless leg syndrome   . Syncope     Medications:  (Not in a hospital admission)   Assessment: Pharmacy consulted to dose heparin in this 60 year old male admitted with VTE.  No prior anticoag noted.   CrCl = 63.3 ml/min  Goal of Therapy:  Heparin level 0.3-0.7 units/ml Monitor platelets by anticoagulation protocol: Yes   Plan:  Give 6000 units bolus x 1 Start heparin infusion at 1500 units/hr Check anti-Xa level in 6 hours and daily while on heparin Continue to monitor H&H and platelets  Udell Mazzocco D 04/06/2020,11:33 PM

## 2020-04-06 NOTE — ED Triage Notes (Signed)
Nausea, vomiting diarrhea x 4 days

## 2020-04-07 ENCOUNTER — Inpatient Hospital Stay: Payer: Medicaid Other

## 2020-04-07 DIAGNOSIS — R6510 Systemic inflammatory response syndrome (SIRS) of non-infectious origin without acute organ dysfunction: Secondary | ICD-10-CM

## 2020-04-07 LAB — URINALYSIS, ROUTINE W REFLEX MICROSCOPIC
Bacteria, UA: NONE SEEN
Bilirubin Urine: NEGATIVE
Glucose, UA: NEGATIVE mg/dL
Ketones, ur: 20 mg/dL — AB
Leukocytes,Ua: NEGATIVE
Nitrite: NEGATIVE
Protein, ur: NEGATIVE mg/dL
Specific Gravity, Urine: >1.046 — ABNORMAL HIGH (ref 1.005–1.030)
Squamous Epithelial / HPF: NONE SEEN (ref 0–5)
pH: 6 (ref 5.0–8.0)

## 2020-04-07 LAB — CBC
HCT: 43.5 % (ref 39.0–52.0)
Hemoglobin: 15.1 g/dL (ref 13.0–17.0)
MCH: 30.1 pg (ref 26.0–34.0)
MCHC: 34.7 g/dL (ref 30.0–36.0)
MCV: 86.8 fL (ref 80.0–100.0)
Platelets: 285 10*3/uL (ref 150–400)
RBC: 5.01 MIL/uL (ref 4.22–5.81)
RDW: 14.6 % (ref 11.5–15.5)
WBC: 9.9 10*3/uL (ref 4.0–10.5)
nRBC: 0 % (ref 0.0–0.2)

## 2020-04-07 LAB — LACTIC ACID, PLASMA: Lactic Acid, Venous: 2.3 mmol/L (ref 0.5–1.9)

## 2020-04-07 LAB — COMPREHENSIVE METABOLIC PANEL
ALT: 17 U/L (ref 0–44)
AST: 16 U/L (ref 15–41)
Albumin: 4 g/dL (ref 3.5–5.0)
Alkaline Phosphatase: 64 U/L (ref 38–126)
Anion gap: 8 (ref 5–15)
BUN: 18 mg/dL (ref 6–20)
CO2: 26 mmol/L (ref 22–32)
Calcium: 8.9 mg/dL (ref 8.9–10.3)
Chloride: 101 mmol/L (ref 98–111)
Creatinine, Ser: 1.07 mg/dL (ref 0.61–1.24)
GFR, Estimated: >60 mL/min (ref >60)
Glucose, Bld: 144 mg/dL — ABNORMAL HIGH (ref 70–99)
Potassium: 3.3 mmol/L — ABNORMAL LOW (ref 3.5–5.1)
Sodium: 135 mmol/L (ref 135–145)
Total Bilirubin: 0.9 mg/dL (ref 0.3–1.2)
Total Protein: 7.3 g/dL (ref 6.5–8.1)

## 2020-04-07 LAB — HIV ANTIBODY (ROUTINE TESTING W REFLEX): HIV Screen 4th Generation wRfx: NONREACTIVE

## 2020-04-07 LAB — SARS CORONAVIRUS 2 (TAT 6-24 HRS): SARS Coronavirus 2: NEGATIVE

## 2020-04-07 LAB — HEPARIN LEVEL (UNFRACTIONATED): Heparin Unfractionated: 1.01 IU/mL — ABNORMAL HIGH (ref 0.30–0.70)

## 2020-04-07 LAB — ETHANOL: Alcohol, Ethyl (B): <10 mg/dL (ref <10)

## 2020-04-07 MED ORDER — BENZTROPINE MESYLATE 1 MG PO TABS
1.0000 mg | ORAL_TABLET | Freq: Two times a day (BID) | ORAL | Status: DC
  Administered 2020-04-07 – 2020-04-08 (×3): 1 mg via ORAL
  Filled 2020-04-07 (×6): qty 1

## 2020-04-07 MED ORDER — QUETIAPINE FUMARATE ER 200 MG PO TB24
400.0000 mg | ORAL_TABLET | Freq: Every day | ORAL | Status: DC
  Administered 2020-04-07 – 2020-04-08 (×2): 400 mg via ORAL
  Filled 2020-04-07 (×3): qty 2

## 2020-04-07 MED ORDER — POTASSIUM CHLORIDE 10 MEQ/100ML IV SOLN
10.0000 meq | INTRAVENOUS | Status: AC
  Administered 2020-04-07 (×4): 10 meq via INTRAVENOUS
  Filled 2020-04-07: qty 100

## 2020-04-07 MED ORDER — DIVALPROEX SODIUM ER 500 MG PO TB24
500.0000 mg | ORAL_TABLET | Freq: Two times a day (BID) | ORAL | Status: DC
  Administered 2020-04-08 (×2): 500 mg via ORAL
  Filled 2020-04-07 (×6): qty 1

## 2020-04-07 MED ORDER — QUETIAPINE FUMARATE 25 MG PO TABS
50.0000 mg | ORAL_TABLET | Freq: Four times a day (QID) | ORAL | Status: DC | PRN
  Administered 2020-04-08 – 2020-04-09 (×2): 50 mg via ORAL
  Filled 2020-04-07 (×2): qty 2

## 2020-04-07 MED ORDER — PANTOPRAZOLE SODIUM 40 MG IV SOLR
40.0000 mg | Freq: Two times a day (BID) | INTRAVENOUS | Status: DC
  Administered 2020-04-08 – 2020-04-09 (×4): 40 mg via INTRAVENOUS
  Filled 2020-04-07 (×3): qty 40

## 2020-04-07 MED ORDER — POLYETHYLENE GLYCOL 3350 17 G PO PACK
17.0000 g | PACK | Freq: Every day | ORAL | Status: DC
  Administered 2020-04-07 – 2020-04-08 (×2): 17 g via ORAL
  Filled 2020-04-07 (×2): qty 1

## 2020-04-07 MED ORDER — GABAPENTIN 300 MG PO CAPS
400.0000 mg | ORAL_CAPSULE | Freq: Three times a day (TID) | ORAL | Status: DC
  Administered 2020-04-08 (×3): 400 mg via ORAL
  Filled 2020-04-07 (×4): qty 1

## 2020-04-07 MED ORDER — FLUOXETINE HCL 20 MG PO CAPS
80.0000 mg | ORAL_CAPSULE | Freq: Every day | ORAL | Status: DC
  Administered 2020-04-08: 80 mg via ORAL
  Filled 2020-04-07 (×3): qty 4

## 2020-04-07 MED ORDER — GADOBUTROL 1 MMOL/ML IV SOLN
8.0000 mL | Freq: Once | INTRAVENOUS | Status: AC | PRN
  Administered 2020-04-07: 8 mL via INTRAVENOUS

## 2020-04-07 MED ORDER — LABETALOL HCL 200 MG PO TABS
200.0000 mg | ORAL_TABLET | Freq: Three times a day (TID) | ORAL | Status: DC | PRN
  Filled 2020-04-07: qty 1

## 2020-04-07 MED ORDER — POTASSIUM CHLORIDE 10 MEQ/100ML IV SOLN
10.0000 meq | INTRAVENOUS | Status: DC
Start: 2020-04-07 — End: 2020-04-07

## 2020-04-07 NOTE — Hospital Course (Signed)
[name] is a 60 y.o. male with medical history significant of hepatitis C, prior history of IV drug abuse, bipolar disorder, depression, GERD, coronary artery disease with previous pericarditis history who presented to the ER with 4 days of persistent nausea vomiting abdominal pain, watery diarrhea x 1 day.  No tolerance for any oral intake.   Patient was tachycardic HR 110 and with uncontrolled BP 160/112.  Labs with leukocytosis ok 13.0k, mild anemia.  Lactic acid up at 2.8.  Probable AKI with Cr 1.33.  Lipase elevated at 111.  Pt had diffuse abdominal tenderness on presentation. CT abdomen pelvis with possible inferior developing splenic infarct, possible enteritis, possible gastroduodenitis, small fat-containing umbilical hernia as well as slightly increased in size of right kidney lesion about 11 mm.   Abdominal U/S showed fatty infiltration of the liver and small gallbladder polyps. Splenic infarct was subsequently ruled out by MRI.  Empiric heparin was therefore stopped.  Patient is on IV PPI for enteritis / duodenitis in addition to supportive care with IV fluids, as needed pain control and antiemetics.

## 2020-04-07 NOTE — Progress Notes (Addendum)
PROGRESS NOTE    [name]   GGY:[id number]  DOB: [birth year]  PCP: [facility]    DOA: 04/06/2020 LOS: 1   Brief Narrative    [name] is a 60 y.o. male with medical history significant of hepatitis C, prior history of IV drug abuse, bipolar disorder, depression, GERD, coronary artery disease with previous pericarditis history who presented to the ER with 4 days of persistent nausea vomiting abdominal pain, watery diarrhea x 1 day.  No tolerance for any oral intake.   Patient was tachycardic HR 110 and with uncontrolled BP 160/112.  Labs with leukocytosis ok 13.0k, mild anemia.  Lactic acid up at 2.8.  Probable AKI with Cr 1.33.  Lipase elevated at 111.  Pt had diffuse abdominal tenderness on presentation. CT abdomen pelvis with possible inferior developing splenic infarct, possible enteritis, possible gastroduodenitis, small fat-containing umbilical hernia as well as slightly increased in size of right kidney lesion about 11 mm.   Abdominal U/S showed fatty infiltration of the liver and small gallbladder polyps. Splenic infarct was subsequently ruled out by MRI.  Empiric heparin was therefore stopped.  Patient is on IV PPI for enteritis / duodenitis in addition to supportive care with IV fluids, as needed pain control and antiemetics.   Assessment & Plan   Principal Problem:   Abdominal pain Active Problems:   SIRS (systemic inflammatory response syndrome) (HCC)   Coronary artery disease involving native coronary artery of native heart with angina pectoris (HCC)   Hyperlipidemia   Accelerated hypertension   Bipolar depression (HCC)    Abdominal pain due to gastritis, duodenitis, acute pancreatitis -unclear etiology as to above.  Splenic infarct was ruled out on MRI.  Stop heparin.  Continue IV Protonix and empiric antibiotics until stool studies returned and negative.  Diet as tolerated.  GI consulted.  Acute kidney injury -present on admission,  likely prerenal azotemia given poor p.o. intake and likely dehydrated.  Baseline renal function a year ago was around 0.7-0.9. Presented with creatinine 1.33. --Continue IV fluids until oral intake tolerated --Monitor BMP AKI resolved 3/29 with creatinine 1.07 after IV fluids  Lactic acidosis -likely related to dehydration.  Clinically do not suspect infection at this time.  Repeat lactate is pending.  SIRS -met criteria with leukocytosis and tachycardia.  Clinically doubt sepsis at this time.  Both have resolved as has lactic acidosis.  Patient is on empiric antibiotics until stool studies are obtained.  Leukocytosis -reactive versus infectious.  Patient has been afebrile.  No evidence of infection at this point.   --Monitor CBC and follow-up stool studies  Hypercalcemia -on admission calcium was 10.9.  Normalized to 8.9 this morning likely from IV hydration.   Right renal mass -refer to radiology reports for further detail of partially exophytic 1.2 cm right interpolar lesion which could be cyst but cannot exclude neoplasm. --Repeat CT or MRI was recommended in 3 to 6 months for further characterization and assessment of stability  Pancreatic cystic lesion -at the head of the pancreas seen on imaging.  See reports.  No suspicious enhancing features on MRI. --Recommendation is pre and postcontrast MRI/MRCP ordered pancreatic protocol CT in 1 year   Comorbidities:  Coronary artery disease -stable, no active chest pain.   Hold aspirin for now.  Hypertension -uncontrolled on admission, possibly due to pain and also unable to keep down medication. Hold home enalapril due to AKI.   As needed labetalol for now.  Bipolar disorder -continue home  gabapentin, Depakote, Cogentin, Seroquel, Prozac   Patient BMI: Body mass index is 26.89 kg/m.   DVT prophylaxis:    Diet:  Diet Orders (From admission, onward)    Start     Ordered   04/06/20 2320  Diet Heart Room service appropriate?  Yes; Fluid consistency: Thin  Diet effective now       Question Answer Comment  Room service appropriate? Yes   Fluid consistency: Thin      04/06/20 2319            Code Status: Full Code    Subjective 04/07/20    Patient continues to have significant abdominal pain when seen today.  He says mostly in the epigastric and right upper quadrant areas.  No fevers or chills.  Still unable to tolerate p.o. intake.  Has nausea but no vomiting today.  Has not had another bowel movement the only diarrhea he had was yesterday.   Disposition Plan & Communication   Status is: Inpatient  Inpatient status is appropriate due to severity of illness requiring IV therapies as outlined above.  Dispo: The patient is from: Home              Anticipated d/c is to: Home              Patient currently not medically stable for discharge   Difficult to place patient     Consults, Procedures, Significant Events   Consultants:   GI  Procedures:   None  Antimicrobials:  Anti-infectives (From admission, onward)   Start     Dose/Rate Route Frequency Ordered Stop   04/07/20 0000  ciprofloxacin (CIPRO) IVPB 400 mg        400 mg 200 mL/hr over 60 Minutes Intravenous Every 12 hours 04/06/20 2345     04/07/20 0000  metroNIDAZOLE (FLAGYL) IVPB 500 mg        500 mg 100 mL/hr over 60 Minutes Intravenous Every 8 hours 04/06/20 2345          Micro    Objective   Vitals:   04/07/20 0530 04/07/20 0600 04/07/20 0800 04/07/20 1232  BP: (!) 148/104 (!) 133/108 (!) 149/99 (!) 158/91  Pulse: 70 69 70 71  Resp:   15 16  Temp:   97.8 F (36.6 C) 97.7 F (36.5 C)  TempSrc:   Oral Oral  SpO2: 98% 96% 96% 98%  Weight:      Height:        Intake/Output Summary (Last 24 hours) at 04/07/2020 1450 Last data filed at 04/07/2020 4627 Gross per 24 hour  Intake 1665.65 ml  Output --  Net 1665.65 ml   Filed Weights   04/06/20 2029  Weight: 85 kg    Physical Exam:  General exam: awake, alert,  no acute distress HEENT: atraumatic, clear conjunctiva, anicteric sclera, moist mucus membranes, hearing grossly normal  Respiratory system: CTAB, no wheezes, rales or rhonchi, normal respiratory effort. Cardiovascular system: normal S1/S2, RRR, no JVD, murmurs, rubs, gallops, no pedal edema.   Gastrointestinal system: soft, right upper quadrant epigastric tenderness on palpation with no guarding or rebound tenderness, +bowel sounds. Central nervous system: A&O x4. no gross focal neurologic deficits, normal speech Extremities: moves all, no edema, normal tone Skin: dry, intact, normal temperature, normal color Psychiatry: normal mood, congruent affect, judgement and insight appear normal  Labs   Data Reviewed: I have personally reviewed following labs and imaging studies  CBC: Recent Labs  Lab 04/06/20 2037 04/07/20 0430  WBC 13.0* 9.9  NEUTROABS 9.2*  --   HGB 18.8* 15.1  HCT 54.1* 43.5  MCV 86.4 86.8  PLT 400 734   Basic Metabolic Panel: Recent Labs  Lab 04/06/20 2037 04/07/20 0430  NA 135 135  K 3.7 3.3*  CL 94* 101  CO2 22 26  GLUCOSE 122* 144*  BUN 24* 18  CREATININE 1.33* 1.07  CALCIUM 10.9* 8.9   GFR: Estimated Creatinine Clearance: 78.6 mL/min (by C-G formula based on SCr of 1.07 mg/dL). Liver Function Tests: Recent Labs  Lab 04/06/20 2037 04/07/20 0430  AST 22 16  ALT 23 17  ALKPHOS 88 64  BILITOT 1.2 0.9  PROT 10.2* 7.3  ALBUMIN 5.7* 4.0   Recent Labs  Lab 04/06/20 2037  LIPASE 111*   No results for input(s): AMMONIA in the last 168 hours. Coagulation Profile: Recent Labs  Lab 04/06/20 2324  INR 1.2   Cardiac Enzymes: Recent Labs  Lab 04/06/20 2037  CKTOTAL 113   BNP (last 3 results) No results for input(s): PROBNP in the last 8760 hours. HbA1C: No results for input(s): HGBA1C in the last 72 hours. CBG: No results for input(s): GLUCAP in the last 168 hours. Lipid Profile: No results for input(s): CHOL, HDL, LDLCALC, TRIG,  CHOLHDL, LDLDIRECT in the last 72 hours. Thyroid Function Tests: No results for input(s): TSH, T4TOTAL, FREET4, T3FREE, THYROIDAB in the last 72 hours. Anemia Panel: No results for input(s): VITAMINB12, FOLATE, FERRITIN, TIBC, IRON, RETICCTPCT in the last 72 hours. Sepsis Labs: Recent Labs  Lab 04/06/20 2037  LATICACIDVEN 2.8*    Recent Results (from the past 240 hour(s))  Blood culture (single)     Status: None (Preliminary result)   Collection Time: 04/06/20  8:37 PM   Specimen: BLOOD  Result Value Ref Range Status   Specimen Description BLOOD RIGHT ANTECUBITAL  Final   Special Requests   Final    BOTTLES DRAWN AEROBIC AND ANAEROBIC Blood Culture results may not be optimal due to an inadequate volume of blood received in culture bottles   Culture   Final    NO GROWTH < 12 HOURS Performed at [facility], [address], [place], [place] [address]    Report Status PENDING  Incomplete  SARS CORONAVIRUS 2 (TAT 6-24 HRS) Nasopharyngeal Nasopharyngeal Swab     Status: None   Collection Time: 04/06/20  8:38 PM   Specimen: Nasopharyngeal Swab  Result Value Ref Range Status   SARS Coronavirus 2 NEGATIVE NEGATIVE Final    Comment: (NOTE) SARS-CoV-2 target nucleic acids are NOT DETECTED.  The SARS-CoV-2 RNA is generally detectable in upper and lower respiratory specimens during the acute phase of infection. Negative results do not preclude SARS-CoV-2 infection, do not rule out co-infections with other pathogens, and should not be used as the sole basis for treatment or other patient management decisions. Negative results must be combined with clinical observations, patient history, and epidemiological information. The expected result is Negative.  Fact Sheet for Patients: SugarRoll.be  Fact Sheet for Healthcare Providers: https://www.woods-mathews.com/  This test is not yet approved or cleared by the [place] FDA and  has  been authorized for detection and/or diagnosis of SARS-CoV-2 by FDA under an Emergency Use Authorization (EUA). This EUA will remain  in effect (meaning this test can be used) for the duration of the COVID-19 declaration under Se ction 564(b)(1) of the Act, 21 U.S.C. section 360bbb-3(b)(1), unless the authorization is terminated or revoked sooner.  Performed at [facility], [place] [address] [place], [place] [address]       Imaging Studies   MR ABDOMEN W WO CONTRAST  Result Date: 04/07/2020 CLINICAL DATA:  Assess for splenic infarction. EXAM: MRI ABDOMEN WITHOUT AND WITH CONTRAST TECHNIQUE: Multiplanar multisequence MR imaging of the abdomen was performed both before and after the administration of intravenous contrast. CONTRAST:  87m GADAVIST GADOBUTROL 1 MMOL/ML IV SOLN COMPARISON:  CT abdomen pelvis April 06, 2020 FINDINGS: Lower chest: No acute abnormality. Hepatobiliary: Widening of the hepatic fissures with enlargement left lobe of the liver fluid. 1.1 cm cyst in the right lobe of the liver on the gallbladder fossa on image 15/6. No suspicious hepatic lesions. Gallbladder is distended without evidence of acute inflammation, commonly seen in a fasting state. No biliary ductal dilation. Pancreas: No pancreatic ductal dilation or evidence of acute inflammation. Cystic 6 mm lesion in the head of the pancreas without suspicious enhancing features on image 27/19. Spleen: Spleen is mildly enlarged measuring 13.7 cm in maximum craniocaudal dimension on image 10/3. No evidence of splenic infarction. Adrenals/Urinary Tract:  Bilateral adrenal glands are unremarkable. No hydronephrosis. Bilateral renal cysts measuring up to 1.1 cm on the left on image 32/19 and the largest on the right measuring 5 mm on image 21/19, and some of which would demonstrate a few thin internal septations such as the 8 mm left upper pole cyst on image 76/15. None of which demonstrate thickened septations or nodular  enhancing components. Partially exophytic 1.2 cm right interpolar renal lesion on image 28/19 which demonstrates some intrinsic T1 shortening on image 67/11 there is no definite postcontrast enhancement. However, there is significant motion on pre and postcontrast imaging limiting the utility subtraction sequences. Stomach/Bowel: Thickening and enhancement of the rugal folds as well as the wall of the antrum and duodenum with associated reduced diffusivity suggestive of gastritis/duodenitis. Otherwise, visualized portions within the abdomen are unremarkable. Vascular/Lymphatic: No pathologically enlarged lymph nodes identified. No abdominal aortic aneurysm demonstrated. Other:  None. Musculoskeletal: No suspicious bone lesions identified. IMPRESSION: 1. No evidence of splenic infarction. 2. Thickening and enhancement of the rugal folds as well as the wall of the antrum and the duodenum, with associated reduced diffusivity, suggestive of gastritis/duodenitis. 3. Widening of the hepatic fissures with enlargement of the left lobe of the liver fluid. Findings may be related to early cirrhosis. There is also mild splenomegaly. Correlate with any clinical evidence of liver disease or risk factors for liver disease. 4. Partially exophytic 1.2 cm right interpolar renal lesion which demonstrates some intrinsic T1 shortening but without definite postcontrast enhancement. However, there is significant motion on pre and postcontrast imaging limiting the utility subtraction sequences. While this may represent a hemorrhagic/proteinaceous cyst it is incompletely evaluated due to artifact from respiratory motion and and indolent renal neoplasm is not completely excluded, recommend follow-up with renal protocol MRI or CT with and without contrast is 3-6 months for further characterization and to assess stability. 5. Cystic lesion in the head of the pancreas without suspicious enhancing features. 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 Recommend follow up pre and post contrast MRI/MRCP or pancreatic protocol CT in 1 year. This recommendation follows ACR consensus guidelines: Management of Incidental Pancreatic Cysts: A White Paper of the ACR Incidental Findings Committee. J Am Coll Radiol 2017;14:911-923. 6. Bilateral Bosniak category 1 and 2 renal cysts. Electronically Signed   By: JDahlia BailiffMD   On: 04/07/2020 01:39   CT ABDOMEN  PELVIS W CONTRAST  Result Date: 04/06/2020 CLINICAL DATA:  Epigastric abdominal pain, more towards the right side EXAM: CT ABDOMEN AND PELVIS WITH CONTRAST TECHNIQUE: Multidetector CT imaging of the abdomen and pelvis was performed using the standard protocol following bolus administration of intravenous contrast. CONTRAST:  180m OMNIPAQUE IOHEXOL 300 MG/ML  SOLN COMPARISON:  CT 01/27/2016 FINDINGS: Lower chest: Atelectatic changes present in the otherwise clear lung bases. Normal heart size. No pericardial effusion. Hepatobiliary: Diffuse hepatic hypoattenuation compatible with hepatic steatosis. Stable 6 mm hypoattenuating focus adjacent gallbladder fossa small benign cysts though too small to fully characterize. No concerning new focal liver lesion. Normal gallbladder and biliary tree without visible calcified gallstone or ductal dilatation. Pancreas: No pancreatic ductal dilatation or surrounding inflammatory changes. Spleen: Which shaped region of peripheral hypoattenuation in the inferior right spleen (2/25), could reflect a site of developing splenic infarct. No other focal splenic lesion. Normal splenic size. Adrenals/Urinary Tract: Normal adrenal glands. Kidneys are normally located with symmetric enhancement and excretion. There are several scattered fluid attenuation cysts in both kidneys as well as additional smaller subcentimeter hypodense foci too small to fully characterize on CT imaging but statistically likely benign. A more focal, hyperattenuating focus is  seen in the posterior interpolar right kidney measuring up to 11 mm in size, previously 7 mm and given increased from comparison Imaging in 2018 remains worrisome for potential renal malignancy. No other suspicious renal lesion, urolithiasis or hydronephrosis. Urinary bladder is unremarkable for the degree of distention. Stomach/Bowel: Distal esophagus is free of acute abnormality. Some slight prominence of the rugal folds likely reflecting a combination contrast timing intramural fatty infiltration which could be seen in the setting of chronic or prior inflammatory change. Similar appearance of the duodenum. No other conspicuous bowel wall enhancement thickening. Slightly fluid-filled appearance of the distal small bowel is nonspecific. No colonic dilatation or wall thickening. Scattered colonic diverticula without focal inflammation to suggest diverticulitis. A normal appendix is visualized in the right mid abdomen without significant periappendiceal inflammatory change accounting for some motion artifact, particularly seen multiplanar reconstruction. Vascular/Lymphatic: Atherosclerotic calcifications within the abdominal aorta and branch vessels. No aneurysm or ectasia. Ostial and proximal plaque narrowing the splanchnic arteries is incompletely assessed on this angiographic technique. No enlarged abdominopelvic lymph nodes. Reproductive: The prostate and seminal vesicles are unremarkable. Other: No abdominopelvic free fluid or free gas. No bowel containing hernias. Small fat containing umbilical hernia minimal stranding of the herniated fat contents, correlate for point tenderness to exclude fat strangulation. Musculoskeletal: No acute osseous abnormality or suspicious osseous lesion. Levocurvature of the lumbar spine, apex L3. IMPRESSION: 1. Which shaped region of peripheral hypoattenuation in the inferior spleen, could reflect a site of developing splenic infarct. 2. Slightly fluid-filled appearance of the  distal small bowel is nonspecific, but can be seen with enteritis. 3. Some slight prominence of the rugal folds likely reflecting a combination contrast timing intramural fatty infiltration which could be seen in the setting of chronic or prior inflammatory change with a similar appearance of the duodenum though should assess for clinical features of gastroduodenitis. 4. Small fat containing umbilical hernia with minimal stranding of the herniated fat contents, correlate for point tenderness to exclude fat strangulation. 5. A hyperattenuating focus in the posterior interpolar right kidney measuring up to 11 mm in size, previously 7 mm. Given increased size from comparison Imaging in 2018 remains worrisome for potential renal malignancy. Warrants further evaluation contrast enhanced MR imaging on an outpatient basis when patient is better able  to tolerate the scanning procedure. 6. Hepatic steatosis. 7. Aortic Atherosclerosis (ICD10-I70.0). Electronically Signed   By: Lovena Le M.D.   On: 04/06/2020 22:51   US ABDOMEN LIMITED RUQ (LIVER/GB)  Result Date: 04/06/2020 CLINICAL DATA:  Right upper quadrant pain with nausea, initial encounter EXAM: ULTRASOUND ABDOMEN LIMITED RIGHT UPPER QUADRANT COMPARISON:  None. FINDINGS: Gallbladder: Gallbladder is well distended. A few small polyps are noted measuring 5 mm. No wall thickening or pericholecystic fluid is noted. Common bile duct: Diameter: 4.5 mm. Liver: Generalized increased echogenicity consistent with fatty infiltration. Focal area of fatty sparing is noted adjacent to the gallbladder fossa. Small cyst is noted measuring 1.4 cm within the liver. Portal vein is patent on color Doppler imaging with normal direction of blood flow towards the liver. Other: None. IMPRESSION: Fatty infiltration of the liver. Small gallbladder polyps. Electronically Signed   By: Inez Catalina M.D.   On: 04/06/2020 21:26     Medications   Scheduled Meds: . polyethylene glycol  17  g Oral Daily   Continuous Infusions: . ciprofloxacin 400 mg (04/07/20 1131)  . dextrose 5% lactated ringers 100 mL/hr at 04/07/20 0824  . metronidazole Stopped (04/07/20 5102)  . potassium chloride 10 mEq (04/07/20 1320)       LOS: 1 day    Time spent: 30 minutes    Ezekiel Slocumb, DO [facility]  04/07/2020, 2:50 PM      If 7PM-7AM, please contact night-coverage. How to contact the [facility] Attending or Consulting provider [place] or covering provider during after hours [place], for this patient?    1. Check the care team in [facility] and look for a) attending/consulting [facility] provider listed and b) the [facility] team listed 2. Log into [email or web address] and use [place]'s universal password to access. If you do not have the password, please contact the hospital operator. 3. Locate the [facility] provider you are looking for under [facility] and page to a number that you can be directly reached. 4. If you still have difficulty reaching the provider, please page the [facility] (Director on Call) for the Hospitalists listed on amion for assistance.

## 2020-04-08 DIAGNOSIS — R1084 Generalized abdominal pain: Secondary | ICD-10-CM

## 2020-04-08 DIAGNOSIS — K529 Noninfective gastroenteritis and colitis, unspecified: Principal | ICD-10-CM

## 2020-04-08 LAB — BASIC METABOLIC PANEL
Anion gap: 7 (ref 5–15)
BUN: 11 mg/dL (ref 6–20)
CO2: 21 mmol/L — ABNORMAL LOW (ref 22–32)
Calcium: 9.1 mg/dL (ref 8.9–10.3)
Chloride: 111 mmol/L (ref 98–111)
Creatinine, Ser: 1.09 mg/dL (ref 0.61–1.24)
GFR, Estimated: >60 mL/min (ref >60)
Glucose, Bld: 107 mg/dL — ABNORMAL HIGH (ref 70–99)
Potassium: 3.7 mmol/L (ref 3.5–5.1)
Sodium: 139 mmol/L (ref 135–145)

## 2020-04-08 LAB — LIPASE, BLOOD: Lipase: 72 U/L — ABNORMAL HIGH (ref 11–51)

## 2020-04-08 LAB — MAGNESIUM: Magnesium: 2.1 mg/dL (ref 1.7–2.4)

## 2020-04-08 MED ORDER — SODIUM CHLORIDE 0.9 % IV SOLN: INTRAVENOUS | Status: DC

## 2020-04-08 MED ORDER — ENOXAPARIN SODIUM 40 MG/0.4ML ~~LOC~~ SOLN
40.0000 mg | SUBCUTANEOUS | Status: DC
  Administered 2020-04-08: 14:00:00 40 mg via SUBCUTANEOUS
  Filled 2020-04-08 (×2): qty 0.4

## 2020-04-08 NOTE — Progress Notes (Signed)
[facility] Progress Note  Patient: [name]    YBF:[id number]  DOA: 04/06/2020     Date of Service: the patient was seen and examined on 04/08/2020  Chief Complaint  Patient presents with  . Nausea    Nausea, vomiting , diarrhea    Brief hospital course: Jovaughn Wojtaszek Smithis a 60 y.o.malewith medical history significant ofhepatitis C, prior history of IV drug abuse, bipolar disorder, depression, GERD, coronary artery disease with previous pericarditis history who presented to the ER with 4 days of persistent nausea vomiting abdominal pain, watery diarrhea x 1 day.  No tolerance for any oral intake.   Patient was tachycardic HR 110 and with uncontrolled BP 160/112.  Labs with leukocytosis ok 13.0k, mild anemia.  Lactic acid up at 2.8.  Probable AKI with Cr 1.33.  Lipase elevated at 111.  Pt had diffuse abdominal tenderness on presentation. CT abdomen pelvis with possible inferior developing splenic infarct, possible enteritis, possible gastroduodenitis, small fat-containing umbilical hernia as well as slightly increased in size of right kidney lesion about 11 mm.   Abdominal U/S showed fatty infiltration of the liver and small gallbladder polyps. Splenic infarct was subsequently ruled out by MRI. Empiric heparin was therefore stopped.  Patient is on IV PPI for enteritis / duodenitis in addition to supportive care with IV fluids, as needed pain control and antiemetics.   Assessment and Plan: Principal Problem:   Abdominal pain Active Problems:   SIRS (systemic inflammatory response syndrome) (HCC)   Coronary artery disease involving native coronary artery of native heart with angina pectoris (HCC)   Hyperlipidemia   Accelerated hypertension   Bipolar depression (HCC)    Abdominal pain due to gastritis, duodenitis, acute pancreatitis -unclear etiology as to above.  Splenic infarct was ruled out on MRI.  Stop heparin.   Continue IV Protonix and empiric antibiotics  until stool studies returned and negative. Lipase 111---72 trending down  Diet as tolerated.   GI consulted.  Acute kidney injury -present on admission, likely prerenal azotemia given poor p.o. intake and likely dehydrated.  Baseline renal function a year ago was around 0.7-0.9. Presented with creatinine 1.33. --Continue IV fluids until oral intake tolerated --Monitor BMP AKI resolved 3/30 with creatinine 1.09 after IV fluids  Lactic acidosis -likely related to dehydration.  Clinically do not suspect infection at this time.  Repeat lactate is pending.  SIRS -met criteria with leukocytosis and tachycardia.  Clinically doubt sepsis at this time.  Both have resolved as has lactic acidosis.  Patient is on empiric antibiotics until stool studies are obtained.  Leukocytosis -reactive versus infectious.  Patient has been afebrile.  No evidence of infection at this point.    Resolved, WBC count within normal range --Monitor CBC and follow-up stool studies  Hypercalcemia -on admission calcium was 10.9, most likely due to dehydration. Normalized to 8.9 this morning likely from IV hydration.   Right renal mass -refer to radiology reports for further detail of partially exophytic 1.2 cm right interpolar lesion which could be cyst but cannot exclude neoplasm. --Repeat CT or MRI was recommended in 3 to 6 months for further characterization and assessment of stability. Patient was informed and advised to follow with PCP.  Pancreatic cystic lesion -at the head of the pancreas seen on imaging.  See reports.  No suspicious enhancing features on MRI. --Recommendation is pre and postcontrast MRI/MRCP ordered pancreatic protocol CT in 1 year Patient was informed and advised to follow with PCP.   Comorbidities:  Coronary artery disease -stable, no active chest pain.   Hold aspirin for now.  Hypertension -uncontrolled on admission, possibly due to pain and also unable to keep down  medication. Hold home enalapril due to AKI.   As needed labetalol for now.  Bipolar disorder -continue home gabapentin, Depakote, Cogentin, Seroquel, Prozac    Body mass index is 26.89 kg/m.            Diet: Heart healthy DVT Prophylaxis: Subcutaneous Lovenox   Advance goals of care discussion: Full code  Family Communication: family was NOT present at bedside, at the time of interview.  The pt provided permission to discuss medical plan with the family. Opportunity was given to ask question and all questions were answered satisfactorily.   Disposition:  Pt is from Home, admitted with abdominal pain, nausea and vomiting and diarrhea, still has abdominal pain, which precludes a safe discharge. Discharge to home, when cleared by GI and when abdominal pain resolves.  Subjective: No diarrhea since hospitalization, nausea and patient vomited one time yesterday.  Still complaining of right upper quadrant pain 7/10.    Physical Exam: General:  alert oriented to time, place, and person.  Appear in no distress, affect appropriate Eyes: PERRLA ENT: Oral Mucosa Clear, moist  Neck: no JVD,  Cardiovascular: S1 and S2 Present, no Murmur,  Respiratory: good respiratory effort, Bilateral Air entry equal and Decreased, no Crackles, no wheezes Abdomen: Bowel Sound present, Soft and no tenderness,  Skin: no rashes Extremities: no Pedal edema, no calf tenderness Neurologic: without any new focal findings Gait not checked due to patient safety concerns  Vitals:   04/08/20 0041 04/08/20 0507 04/08/20 0819 04/08/20 1152  BP: 104/66 106/82 (!) 119/93 107/70  Pulse: (!) 105 94 89 78  Resp: '20 16 16 16  ' Temp: 97.9 F (36.6 C) 97.7 F (36.5 C) 97.7 F (36.5 C) (!) 97.5 F (36.4 C)  TempSrc: Oral Oral Oral Oral  SpO2: 97% 94% 96%   Weight:      Height:       No intake or output data in the 24 hours ending 04/08/20 1220 Filed Weights   04/06/20 2029  Weight: 85 kg    Data  Reviewed: I have personally reviewed and interpreted daily labs, tele strips, imagings as discussed above. I reviewed all nursing notes, pharmacy notes, vitals, pertinent old records I have discussed plan of care as described above with RN and patient/family.  CBC: Recent Labs  Lab 04/06/20 2037 04/07/20 0430  WBC 13.0* 9.9  NEUTROABS 9.2*  --   HGB 18.8* 15.1  HCT 54.1* 43.5  MCV 86.4 86.8  PLT 400 572   Basic Metabolic Panel: Recent Labs  Lab 04/06/20 2037 04/07/20 0430 04/08/20 0407  NA 135 135 139  K 3.7 3.3* 3.7  CL 94* 101 111  CO2 22 26 21*  GLUCOSE 122* 144* 107*  BUN 24* 18 11  CREATININE 1.33* 1.07 1.09  CALCIUM 10.9* 8.9 9.1  MG  --   --  2.1    Studies: No results found.  Scheduled Meds: . benztropine  1 mg Oral BID  . divalproex  500 mg Oral BID  . FLUoxetine  80 mg Oral Daily  . gabapentin  400 mg Oral TID  . pantoprazole (PROTONIX) IV  40 mg Intravenous Q12H  . polyethylene glycol  17 g Oral Daily  . QUEtiapine  400 mg Oral QHS   Continuous Infusions: . ciprofloxacin 400 mg (04/08/20 1149)  . dextrose  5% lactated ringers 100 mL/hr at 04/08/20 0455  . metronidazole 500 mg (04/08/20 0934)   PRN Meds: acetaminophen **OR** acetaminophen, labetalol, morphine injection, ondansetron **OR** ondansetron (ZOFRAN) IV, QUEtiapine  Time spent: 35 minutes  Author: Val Riles. MD [facility] 04/08/2020 12:20 PM  To reach On-call, see care teams to locate the attending and reach out to them via [email or web address]. If 7PM-7AM, please contact night-coverage If you still have difficulty reaching the attending provider, please page the [facility] (Director on Call) for [facility] on amion for assistance.

## 2020-04-08 NOTE — Plan of Care (Signed)

## 2020-04-08 NOTE — Consult Note (Signed)
[name] , MD [address], [place], [place], [address] [address], [address], [place], [place], [address] Phone: [phone]  Fax: [phone]  Consultation  Referring Provider:     Dr. Denton Lank  primary Care Physician:  [facility] Primary Gastroenterologist: None         Reason for Consultation:     Abdominal pain  Date of Admission:  04/06/2020 Date of Consultation:  04/08/2020         HPI:   [name] is a 60 y.o. male was admitted on 04/06/2020 with abdominal pain, nausea, vomiting.  Past medical history of hepatitis C, prior history of drug abuse, bipolar, depression, GERD, CAD.  On the day of admission had abdominal pain nausea vomiting and watery stools but not able to keep any food down.  In the emergency room he underwent an right upper quadrant ultrasound that showed fatty infiltration of the liver and small gallbladder polyps.  Subsequently had a CT scan of the abdomen that showed region of peripheral attenuation in the inferior aspect of the spleen suggestive of splenic infarct and fluid-filled appearance of the distal small bowel possibly enteritis inflammation of the duodenum suggestive gastroduodenitis, umbilical hernia with minimal stranding rule out fat strangulation lesion in the interpolar right kidney area further evaluation with MR suggested.  This was followed by an MRI of the abdomen with and without contrast which showed no evidence of splenic infarction, showed features of gastroduodenitis.  Possible early cirrhosis of the liver with mild splenomegaly.  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  CT or MRI in 1 year was recommended.   Stool studies for GI PCR and C. difficile were ordered but not performed.  Lipase elevated 111, hemoglobin 18.8 on admission with an elevated lactic acid of 2.8.  Repeat  hemoglobin was normal at 15.1.  Creatinine 1.07  He says the nausea, vomiting and diarrhea began a few days back but have all resolved since admission . Only complaint is central abdominal pain which he has had for years, at times radiating to his right side, worse with food intake . No other relieving factors.   Denies any NSAID use.  Past Medical History:  Diagnosis Date  . Anxiety   . Arthritis    knees and hands  . Bipolar disorder (HCC)   . Depression   . GERD (gastroesophageal reflux disease)   . Hepatitis    HEP "C"  . History of kidney stones   . Hypertension   . 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 Kidney stones   . Pericarditis 05/2015   a. echo 5/17: EF 60-65%, no RWMA, LV dias fxn nl, LA mildly dilated, RV sys fxn nl, PASP nl, moderate sized circumferential pericardial effusion was identified, 2.12 cm around the LV free wall, <1 cm around the RV free wall. Features were not c/w tamponade physiology  . PTSD (post-traumatic stress disorder)    Witnessed brother's suicide.  Marland Kitchen Restless leg syndrome   . Syncope     Past Surgical History:  Procedure Laterality Date  . CYSTOSCOPY WITH URETEROSCOPY AND STENT PLACEMENT    . ESOPHAGOGASTRODUODENOSCOPY N/A 01/11/2016   Procedure: ESOPHAGOGASTRODUODENOSCOPY (EGD);  Surgeon: Charlott Rakes, MD;  Location: [facility] ENDOSCOPY;  Service: Endoscopy;  Laterality: N/A;  . INCISION AND DRAINAGE ABSCESS Left 01/02/2018  Procedure: INCISION AND DRAINAGE LEFT KNEE;  Surgeon: Deeann Saint, MD;  Location: [facility] ORS;  Service: Orthopedics;  Laterality: Left;  . JOINT REPLACEMENT Right    TKR  . KNEE ARTHROSCOPY Right 06/25/2014   Procedure: ARTHROSCOPY KNEE;  Surgeon: Deeann Saint, MD;  Location: [facility] ORS;  Service: Orthopedics;  Laterality: Right;  partial arthroscopic medial menisectomy  . TOTAL KNEE ARTHROPLASTY Right 04/22/2015   Procedure: TOTAL KNEE ARTHROPLASTY;  Surgeon: Deeann Saint, MD;  Location: [facility] ORS;   Service: Orthopedics;  Laterality: Right;  . TOTAL KNEE ARTHROPLASTY Left 10/30/2017   Procedure: TOTAL KNEE ARTHROPLASTY;  Surgeon: Deeann Saint, MD;  Location: [facility] ORS;  Service: Orthopedics;  Laterality: Left;  . TOTAL KNEE REVISION Left 01/02/2018   Procedure: poly exchange of tibia and patella left knee;  Surgeon: Deeann Saint, MD;  Location: [facility] ORS;  Service: Orthopedics;  Laterality: Left;    Prior to Admission medications   Medication Sig Start Date End Date Taking? Authorizing Provider  aspirin EC 325 MG tablet Take 1 tablet (325 mg total) by mouth daily. 11/03/17  Yes Deeann Saint, MD  benztropine (COGENTIN) 1 MG tablet Take 1 mg by mouth 2 (two) times daily. 03/05/20  Yes [provider]  divalproex (DEPAKOTE ER) 500 MG 24 hr tablet Take 500 mg by mouth 2 (two) times daily. 03/05/20  Yes [provider]  enalapril (VASOTEC) 10 MG tablet Take 10 mg by mouth daily. 01/16/20  Yes [provider]  FLUoxetine (PROZAC) 40 MG capsule Take 80 mg by mouth daily.    Yes [provider]  gabapentin (NEURONTIN) 400 MG capsule Take 400 mg by mouth 3 (three) times daily. 03/24/20  Yes [provider]  Multiple Vitamin (MULTIVITAMIN WITH MINERALS) TABS tablet Take 1 tablet by mouth daily.   Yes [provider]  QUEtiapine (SEROQUEL) 50 MG tablet Take 50 mg by mouth 4 (four) times daily as needed (for agitation).    Yes [provider]  SEROQUEL XR 400 MG 24 hr tablet Take 400 mg by mouth at bedtime.    Yes [provider]  divalproex (DEPAKOTE) 500 MG DR tablet Take 500 mg by mouth 2 (two) times daily. Patient not taking: Reported on 04/07/2020    [provider]  docusate sodium (COLACE) 100 MG capsule Take 1 capsule (100 mg total) by mouth 2 (two) times daily. Patient not taking: No sig reported 01/25/19   Donovan Kail, PA-C  methocarbamol (ROBAXIN) 500 MG tablet Take 1 tablet (500 mg total) by mouth every 6  (six) hours. Patient not taking: No sig reported 01/25/19   Donovan Kail, PA-C  oxyCODONE 10 MG TABS Take 1 tablet (10 mg total) by mouth every 6 (six) hours as needed for severe pain or breakthrough pain. Patient not taking: No sig reported 01/25/19   Donovan Kail, PA-C  polyethylene glycol (MIRALAX / GLYCOLAX) 17 g packet Take 17 g by mouth daily as needed for mild constipation or moderate constipation. Patient not taking: No sig reported 01/25/19   Donovan Kail, PA-C  traMADol (ULTRAM) 50 MG tablet Take 2 tablets (100 mg total) by mouth every 6 (six) hours. Patient not taking: No sig reported 01/25/19   Donovan Kail, PA-C    Family History  Problem Relation Age of Onset  . CVA Mother        deceased at age 30  . Depression Brother        Died by suicide at age  20     Social History   Tobacco Use  . Smoking status: Former Smoker    Packs/day: 0.75    Years: 20.00    Pack years: 15.00    Types: Cigarettes    Quit date: 05/16/1984    Years since quitting: 35.9  . Smokeless tobacco: Never Used  Substance Use Topics  . Alcohol use: Yes    Comment: occ  . Drug use: No    Allergies as of 04/06/2020 - Review Complete 04/06/2020  Allergen Reaction Noted  . Toradol [ketorolac tromethamine] Hives 06/25/2014    Review of Systems:    All systems reviewed and negative except where noted in HPI.   Physical Exam:  Vital signs in last 24 hours: Temp:  [97.5 F (36.4 C)-97.9 F (36.6 C)] 97.7 F (36.5 C) (03/30 0819) Pulse Rate:  [69-105] 89 (03/30 0819) Resp:  [16-20] 16 (03/30 0819) BP: (104-158)/(66-96) 119/93 (03/30 0819) SpO2:  [94 %-99 %] 96 % (03/30 0819) Last BM Date: 04/06/20 General:   Pleasant, cooperative in NAD Head:  Normocephalic and atraumatic. Eyes:   No icterus.   Conjunctiva pink. PERRLA. Ears:  Normal auditory acuity. Neck:  Supple; no masses or thyroidomegaly Lungs: Respirations even and unlabored. Lungs clear to auscultation bilaterally.    No wheezes, crackles, or rhonchi.  Heart:  Regular rate and rhythm;  Without murmur, clicks, rubs or gallops Abdomen:  Soft, nondistended, nontender. Normal bowel sounds. No appreciable masses or hepatomegaly.  No rebound or guarding.  Neurologic:  Alert and oriented x3;  grossly normal neurologically. Skin:  Intact without significant lesions or rashes. Cervical Nodes:  No significant cervical adenopathy. Psych:  Alert and cooperative. Normal affect.  LAB RESULTS: Recent Labs    04/06/20 2037 04/07/20 0430  WBC 13.0* 9.9  HGB 18.8* 15.1  HCT 54.1* 43.5  PLT 400 285   BMET Recent Labs    04/06/20 2037 04/07/20 0430 04/08/20 0407  NA 135 135 139  K 3.7 3.3* 3.7  CL 94* 101 111  CO2 22 26 21*  GLUCOSE 122* 144* 107*  BUN 24* 18 11  CREATININE 1.33* 1.07 1.09  CALCIUM 10.9* 8.9 9.1   LFT Recent Labs    04/07/20 0430  PROT 7.3  ALBUMIN 4.0  AST 16  ALT 17  ALKPHOS 64  BILITOT 0.9   PT/INR Recent Labs    04/06/20 2324  LABPROT 14.3  INR 1.2    STUDIES: MR ABDOMEN W WO CONTRAST  Result Date: 04/07/2020 CLINICAL DATA:  Assess for splenic infarction. EXAM: MRI ABDOMEN WITHOUT AND WITH CONTRAST TECHNIQUE: Multiplanar multisequence MR imaging of the abdomen was performed both before and after the administration of intravenous contrast. CONTRAST:  31mL GADAVIST GADOBUTROL 1 MMOL/ML IV SOLN COMPARISON:  CT abdomen pelvis April 06, 2020 FINDINGS: Lower chest: No acute abnormality. Hepatobiliary: Widening of the hepatic fissures with enlargement left lobe of the liver fluid. 1.1 cm cyst in the right lobe of the liver on the gallbladder fossa on image 15/6. No suspicious hepatic lesions. Gallbladder is distended without evidence of acute inflammation, commonly seen in a fasting state. No biliary ductal dilation. Pancreas: No pancreatic ductal dilation or evidence of acute inflammation. Cystic 6 mm lesion in the head of the pancreas without suspicious enhancing features on  image 27/19. Spleen: Spleen is mildly enlarged measuring 13.7 cm in maximum craniocaudal dimension on image 10/3. No evidence of splenic infarction. Adrenals/Urinary Tract:  Bilateral adrenal glands are unremarkable. No hydronephrosis. Bilateral renal cysts  measuring up to 1.1 cm on the left on image 32/19 and the largest on the right measuring 5 mm on image 21/19, and some of which would demonstrate a few thin internal septations such as the 8 mm left upper pole cyst on image 76/15. None of which demonstrate thickened septations or nodular enhancing components. Partially exophytic 1.2 cm right interpolar renal lesion on image 28/19 which demonstrates some intrinsic T1 shortening on image 67/11 there is no definite postcontrast enhancement. However, there is significant motion on pre and postcontrast imaging limiting the utility subtraction sequences. Stomach/Bowel: Thickening and enhancement of the rugal folds as well as the wall of the antrum and duodenum with associated reduced diffusivity suggestive of gastritis/duodenitis. Otherwise, visualized portions within the abdomen are unremarkable. Vascular/Lymphatic: No pathologically enlarged lymph nodes identified. No abdominal aortic aneurysm demonstrated. Other:  None. Musculoskeletal: No suspicious bone lesions identified. IMPRESSION: 1. No evidence of splenic infarction. 2. Thickening and enhancement of the rugal folds as well as the wall of the antrum and the duodenum, with associated reduced diffusivity, suggestive of gastritis/duodenitis. 3. Widening of the hepatic fissures with enlargement of the left lobe of the liver fluid. Findings may be related to early cirrhosis. There is also mild splenomegaly. Correlate with any clinical evidence of liver disease or risk factors for liver disease. 4. Partially exophytic 1.2 cm right interpolar renal lesion which demonstrates some intrinsic T1 shortening but without definite postcontrast enhancement. However, there is  significant motion on pre and postcontrast imaging limiting the utility subtraction sequences. While this may represent a hemorrhagic/proteinaceous cyst it is incompletely evaluated due to artifact from respiratory motion and and indolent renal neoplasm is not completely excluded, recommend follow-up with renal protocol MRI or CT with and without contrast is 3-6 months for further characterization and to assess stability. 5. Cystic lesion in the head of the pancreas without suspicious enhancing features. 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 Recommend follow up pre and post contrast MRI/MRCP or pancreatic protocol CT in 1 year. This recommendation follows ACR consensus guidelines: Management of Incidental Pancreatic Cysts: A White Paper of the ACR Incidental Findings Committee. J Am Coll Radiol 2017;14:911-923. 6. Bilateral Bosniak category 1 and 2 renal cysts. Electronically Signed   By: Maudry MayhewJeffrey  Waltz MD   On: 04/07/2020 01:39   CT ABDOMEN PELVIS W CONTRAST  Result Date: 04/06/2020 CLINICAL DATA:  Epigastric abdominal pain, more towards the right side EXAM: CT ABDOMEN AND PELVIS WITH CONTRAST TECHNIQUE: Multidetector CT imaging of the abdomen and pelvis was performed using the standard protocol following bolus administration of intravenous contrast. CONTRAST:  100mL OMNIPAQUE IOHEXOL 300 MG/ML  SOLN COMPARISON:  CT 01/27/2016 FINDINGS: Lower chest: Atelectatic changes present in the otherwise clear lung bases. Normal heart size. No pericardial effusion. Hepatobiliary: Diffuse hepatic hypoattenuation compatible with hepatic steatosis. Stable 6 mm hypoattenuating focus adjacent gallbladder fossa small benign cysts though too small to fully characterize. No concerning new focal liver lesion. Normal gallbladder and biliary tree without visible calcified gallstone or ductal dilatation. Pancreas: No pancreatic ductal dilatation or surrounding inflammatory changes. Spleen: Which  shaped region of peripheral hypoattenuation in the inferior right spleen (2/25), could reflect a site of developing splenic infarct. No other focal splenic lesion. Normal splenic size. Adrenals/Urinary Tract: Normal adrenal glands. Kidneys are normally located with symmetric enhancement and excretion. There are several scattered fluid attenuation cysts in both kidneys as well as additional smaller subcentimeter hypodense foci too small to fully characterize on CT  imaging but statistically likely benign. A more focal, hyperattenuating focus is seen in the posterior interpolar right kidney measuring up to 11 mm in size, previously 7 mm and given increased from comparison Imaging in 2018 remains worrisome for potential renal malignancy. No other suspicious renal lesion, urolithiasis or hydronephrosis. Urinary bladder is unremarkable for the degree of distention. Stomach/Bowel: Distal esophagus is free of acute abnormality. Some slight prominence of the rugal folds likely reflecting a combination contrast timing intramural fatty infiltration which could be seen in the setting of chronic or prior inflammatory change. Similar appearance of the duodenum. No other conspicuous bowel wall enhancement thickening. Slightly fluid-filled appearance of the distal small bowel is nonspecific. No colonic dilatation or wall thickening. Scattered colonic diverticula without focal inflammation to suggest diverticulitis. A normal appendix is visualized in the right mid abdomen without significant periappendiceal inflammatory change accounting for some motion artifact, particularly seen multiplanar reconstruction. Vascular/Lymphatic: Atherosclerotic calcifications within the abdominal aorta and branch vessels. No aneurysm or ectasia. Ostial and proximal plaque narrowing the splanchnic arteries is incompletely assessed on this angiographic technique. No enlarged abdominopelvic lymph nodes. Reproductive: The prostate and seminal vesicles  are unremarkable. Other: No abdominopelvic free fluid or free gas. No bowel containing hernias. Small fat containing umbilical hernia minimal stranding of the herniated fat contents, correlate for point tenderness to exclude fat strangulation. Musculoskeletal: No acute osseous abnormality or suspicious osseous lesion. Levocurvature of the lumbar spine, apex L3. IMPRESSION: 1. Which shaped region of peripheral hypoattenuation in the inferior spleen, could reflect a site of developing splenic infarct. 2. Slightly fluid-filled appearance of the distal small bowel is nonspecific, but can be seen with enteritis. 3. Some slight prominence of the rugal folds likely reflecting a combination contrast timing intramural fatty infiltration which could be seen in the setting of chronic or prior inflammatory change with a similar appearance of the duodenum though should assess for clinical features of gastroduodenitis. 4. Small fat containing umbilical hernia with minimal stranding of the herniated fat contents, correlate for point tenderness to exclude fat strangulation. 5. A hyperattenuating focus in the posterior interpolar right kidney measuring up to 11 mm in size, previously 7 mm. Given increased size from comparison Imaging in 2018 remains worrisome for potential renal malignancy. Warrants further evaluation contrast enhanced MR imaging on an outpatient basis when patient is better able to tolerate the scanning procedure. 6. Hepatic steatosis. 7. Aortic Atherosclerosis (ICD10-I70.0). Electronically Signed   By: Kreg Shropshire M.D.   On: 04/06/2020 22:51   US ABDOMEN LIMITED RUQ (LIVER/GB)  Result Date: 04/06/2020 CLINICAL DATA:  Right upper quadrant pain with nausea, initial encounter EXAM: ULTRASOUND ABDOMEN LIMITED RIGHT UPPER QUADRANT COMPARISON:  None. FINDINGS: Gallbladder: Gallbladder is well distended. A few small polyps are noted measuring 5 mm. No wall thickening or pericholecystic fluid is noted. Common bile  duct: Diameter: 4.5 mm. Liver: Generalized increased echogenicity consistent with fatty infiltration. Focal area of fatty sparing is noted adjacent to the gallbladder fossa. Small cyst is noted measuring 1.4 cm within the liver. Portal vein is patent on color Doppler imaging with normal direction of blood flow towards the liver. Other: None. IMPRESSION: Fatty infiltration of the liver. Small gallbladder polyps. Electronically Signed   By: Alcide Clever M.D.   On: 04/06/2020 21:26      Impression / Plan:   [name] is a 60 y.o. y/o male presented with acute nausea vomiting and diarrhea.  Features of gastroduodenitis seen on imaging and incidental findings of her  renal lesion as well as a lesion suggestive of sidebranch IPMN of the pancreas noted.Likely nausea/vomiting and diarrhea were due to acute gastroenteritis which has resolved.    1.  Renal lesion follow-up with nephrology as an outpatient 2.  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  MRI MRCP in 1 year to be done as an outpatient. 3.  Stop all NSAID use. 4.  Continue PPI 5.  Check H. pylori stool antigen 6.  EGD tomorrow  7.  MRI features of possible cirrhosis can be evaluated as an outpatient with further imaging.  I have discussed alternative options, risks & benefits,  which include, but are not limited to, bleeding, infection, perforation,respiratory complication & drug reaction.  The patient agrees with this plan & written consent will be obtained.   Thank you for involving me in the care of this patient.      LOS: 2 days   Victor Mood, MD  04/08/2020, 8:56 AM

## 2020-04-09 ENCOUNTER — Encounter: Payer: Self-pay | Admitting: Internal Medicine

## 2020-04-09 ENCOUNTER — Other Ambulatory Visit: Payer: Self-pay

## 2020-04-09 ENCOUNTER — Inpatient Hospital Stay: Payer: Medicaid Other | Admitting: Anesthesiology

## 2020-04-09 ENCOUNTER — Encounter
Admission: EM | Disposition: A | Discharge status: Home / Self Care | Payer: Self-pay | Source: Home / Self Care | Attending: Student

## 2020-04-09 HISTORY — PX: ESOPHAGOGASTRODUODENOSCOPY: SHX5428

## 2020-04-09 LAB — BASIC METABOLIC PANEL
Anion gap: 6 (ref 5–15)
BUN: 12 mg/dL (ref 6–20)
CO2: 23 mmol/L (ref 22–32)
Calcium: 8.9 mg/dL (ref 8.9–10.3)
Chloride: 111 mmol/L (ref 98–111)
Creatinine, Ser: 1.1 mg/dL (ref 0.61–1.24)
GFR, Estimated: >60 mL/min (ref >60)
Glucose, Bld: 101 mg/dL — ABNORMAL HIGH (ref 70–99)
Potassium: 3.9 mmol/L (ref 3.5–5.1)
Sodium: 140 mmol/L (ref 135–145)

## 2020-04-09 LAB — CBC
HCT: 38.4 % — ABNORMAL LOW (ref 39.0–52.0)
Hemoglobin: 13.2 g/dL (ref 13.0–17.0)
MCH: 30.9 pg (ref 26.0–34.0)
MCHC: 34.4 g/dL (ref 30.0–36.0)
MCV: 89.9 fL (ref 80.0–100.0)
Platelets: 225 10*3/uL (ref 150–400)
RBC: 4.27 MIL/uL (ref 4.22–5.81)
RDW: 14.8 % (ref 11.5–15.5)
WBC: 5.8 10*3/uL (ref 4.0–10.5)
nRBC: 0 % (ref 0.0–0.2)

## 2020-04-09 LAB — LIPASE, BLOOD: Lipase: 47 U/L (ref 11–51)

## 2020-04-09 LAB — MAGNESIUM: Magnesium: 2.1 mg/dL (ref 1.7–2.4)

## 2020-04-09 LAB — PHOSPHORUS: Phosphorus: 2.4 mg/dL — ABNORMAL LOW (ref 2.5–4.6)

## 2020-04-09 SURGERY — EGD (ESOPHAGOGASTRODUODENOSCOPY)
Anesthesia: General

## 2020-04-09 MED ORDER — LIDOCAINE HCL (CARDIAC) PF 100 MG/5ML IV SOSY
PREFILLED_SYRINGE | INTRAVENOUS | Status: DC | PRN
  Administered 2020-04-09: 50 mg via INTRAVENOUS

## 2020-04-09 MED ORDER — PANTOPRAZOLE SODIUM 40 MG PO TBEC: 40.0000 mg | DELAYED_RELEASE_TABLET | Freq: Every day | ORAL | 2 refills | Status: DC

## 2020-04-09 MED ORDER — PROPOFOL 500 MG/50ML IV EMUL
INTRAVENOUS | Status: DC | PRN
  Administered 2020-04-09: 120 ug/kg/min via INTRAVENOUS

## 2020-04-09 MED ORDER — SODIUM CHLORIDE 0.9 % IV SOLN: INTRAVENOUS | Status: DC

## 2020-04-09 NOTE — Anesthesia Postprocedure Evaluation (Signed)
Anesthesia Post Note  Patient: [name]  Procedure(s) Performed: ESOPHAGOGASTRODUODENOSCOPY (EGD) (N/A )  Patient location during evaluation: PACU Anesthesia Type: General Level of consciousness: awake and alert Pain management: pain level controlled Vital Signs Assessment: post-procedure vital signs reviewed and stable Respiratory status: spontaneous breathing, nonlabored ventilation and respiratory function stable Cardiovascular status: blood pressure returned to baseline and stable Postop Assessment: no apparent nausea or vomiting Anesthetic complications: no   No complications documented.   Last Vitals:  Vitals:   04/09/20 1030 04/09/20 1052  BP: (!) 135/101 (!) 144/86  Pulse: 70 69  Resp: 12 17  Temp:  36.4 C  SpO2: 100% 100%    Last Pain:  Vitals:   04/09/20 1200  TempSrc:   PainSc: 5                  Karleen Hampshire

## 2020-04-09 NOTE — H&P (Signed)
[name], MD [address], [address], [place], [place], [address] [address], [place], [place], [address] Phone: [phone]  Fax: [phone]  Primary Care Physician:  [facility]   Pre-Procedure History & Physical: HPI:  [name] is a 60 y.o. male is here for an endoscopy    Past Medical History:  Diagnosis Date  . Anxiety   . Arthritis    knees and hands  . Bipolar disorder (HCC)   . Depression   . GERD (gastroesophageal reflux disease)   . Hepatitis    HEP "C"  . History of kidney stones   . Hypertension   . 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 Kidney stones   . Pericarditis 05/2015   a. echo 5/17: EF 60-65%, no RWMA, LV dias fxn nl, LA mildly dilated, RV sys fxn nl, PASP nl, moderate sized circumferential pericardial effusion was identified, 2.12 cm around the LV free wall, <1 cm around the RV free wall. Features were not c/w tamponade physiology  . PTSD (post-traumatic stress disorder)    Witnessed brother's suicide.  Marland Kitchen Restless leg syndrome   . Syncope     Past Surgical History:  Procedure Laterality Date  . CYSTOSCOPY WITH URETEROSCOPY AND STENT PLACEMENT    . ESOPHAGOGASTRODUODENOSCOPY N/A 01/11/2016   Procedure: ESOPHAGOGASTRODUODENOSCOPY (EGD);  Surgeon: Charlott Rakes, MD;  Location: [facility] ENDOSCOPY;  Service: Endoscopy;  Laterality: N/A;  . INCISION AND DRAINAGE ABSCESS Left 01/02/2018   Procedure: INCISION AND DRAINAGE LEFT KNEE;  Surgeon: Deeann Saint, MD;  Location: [facility] ORS;  Service: Orthopedics;  Laterality: Left;  . JOINT REPLACEMENT Right    TKR  . KNEE ARTHROSCOPY Right 06/25/2014   Procedure: ARTHROSCOPY KNEE;  Surgeon: Deeann Saint, MD;  Location: [facility] ORS;  Service: Orthopedics;  Laterality: Right;  partial arthroscopic medial menisectomy  . TOTAL KNEE ARTHROPLASTY Right 04/22/2015   Procedure: TOTAL KNEE ARTHROPLASTY;  Surgeon: Deeann Saint, MD;  Location: [facility] ORS;  Service:  Orthopedics;  Laterality: Right;  . TOTAL KNEE ARTHROPLASTY Left 10/30/2017   Procedure: TOTAL KNEE ARTHROPLASTY;  Surgeon: Deeann Saint, MD;  Location: [facility] ORS;  Service: Orthopedics;  Laterality: Left;  . TOTAL KNEE REVISION Left 01/02/2018   Procedure: poly exchange of tibia and patella left knee;  Surgeon: Deeann Saint, MD;  Location: [facility] ORS;  Service: Orthopedics;  Laterality: Left;    Prior to Admission medications   Medication Sig Start Date End Date Taking? Authorizing Provider  aspirin EC 325 MG tablet Take 1 tablet (325 mg total) by mouth daily. 11/03/17  Yes Deeann Saint, MD  benztropine (COGENTIN) 1 MG tablet Take 1 mg by mouth 2 (two) times daily. 03/05/20  Yes [provider]  divalproex (DEPAKOTE ER) 500 MG 24 hr tablet Take 500 mg by mouth 2 (two) times daily. 03/05/20  Yes [provider]  enalapril (VASOTEC) 10 MG tablet Take 10 mg by mouth daily. 01/16/20  Yes [provider]  FLUoxetine (PROZAC) 40 MG capsule Take 80 mg by mouth daily.    Yes [provider]  gabapentin (NEURONTIN) 400 MG capsule Take 400 mg by mouth 3 (three) times daily. 03/24/20  Yes [provider]  Multiple Vitamin (MULTIVITAMIN WITH MINERALS) TABS tablet Take 1 tablet by mouth daily.   Yes [provider]  QUEtiapine (SEROQUEL) 50 MG tablet Take 50 mg by mouth 4 (four) times daily as needed (for agitation).    Yes [provider]  SEROQUEL XR 400 MG 24 hr tablet Take 400 mg by mouth at bedtime.    Yes [provider]  divalproex (DEPAKOTE) 500 MG DR tablet Take 500 mg by mouth 2 (two) times daily. Patient not taking: Reported on 04/07/2020    [provider]  docusate sodium (COLACE) 100 MG capsule Take 1 capsule (100 mg total) by mouth 2 (two) times daily. Patient not taking: No sig reported 01/25/19   Donovan Kail, PA-C  methocarbamol (ROBAXIN) 500 MG tablet Take 1 tablet (500 mg total) by mouth every 6 (six)  hours. Patient not taking: No sig reported 01/25/19   Donovan Kail, PA-C  oxyCODONE 10 MG TABS Take 1 tablet (10 mg total) by mouth every 6 (six) hours as needed for severe pain or breakthrough pain. Patient not taking: No sig reported 01/25/19   Donovan Kail, PA-C  polyethylene glycol (MIRALAX / GLYCOLAX) 17 g packet Take 17 g by mouth daily as needed for mild constipation or moderate constipation. Patient not taking: No sig reported 01/25/19   Donovan Kail, PA-C  traMADol (ULTRAM) 50 MG tablet Take 2 tablets (100 mg total) by mouth every 6 (six) hours. Patient not taking: No sig reported 01/25/19   Donovan Kail, PA-C    Allergies as of 04/06/2020 - Review Complete 04/06/2020  Allergen Reaction Noted  . Toradol [ketorolac tromethamine] Hives 06/25/2014    Family History  Problem Relation Age of Onset  . CVA Mother        deceased at age 77  . Depression Brother        Died by suicide at age 16    Social History   Socioeconomic History  . Marital status: Single    Spouse name: Not on file  . Number of children: Not on file  . Years of education: Not on file  . Highest education level: Not on file  Occupational History  . Not on file  Tobacco Use  . Smoking status: Former Smoker    Packs/day: 0.75    Years: 20.00    Pack years: 15.00    Types: Cigarettes    Quit date: 05/16/1984    Years since quitting: 35.9  . Smokeless tobacco: Never Used  Vaping Use  . Vaping Use: Not on file  Substance and Sexual Activity  . Alcohol use: Yes    Comment: occ  . Drug use: No  . Sexual activity: Not on file  Other Topics Concern  . Not on file  Social History Narrative  . Not on file   Social Determinants of Health   Financial Resource Strain: Not on file  Food Insecurity: Not on file  Transportation Needs: Not on file  Physical Activity: Not on file  Stress: Not on file  Social Connections: Not on file  Intimate Partner Violence: Not on file    Review of  Systems: See HPI, otherwise negative ROS  Physical Exam: BP 107/75 (BP Location: Left Arm)   Pulse 69   Temp 97.8 F (36.6 C) (Oral)   Resp 18   Ht 5\' 10"  (1.778 m)   Wt 85 kg   SpO2 95%   BMI 26.89 kg/m  General:   Alert,  pleasant and cooperative in NAD Head:  Normocephalic and atraumatic. Neck:  Supple; no masses or thyromegaly. Lungs:  Clear throughout to auscultation, normal respiratory effort.    Heart:  +S1, +S2, Regular rate and rhythm, No edema. Abdomen:  Soft, nontender and nondistended.  Normal bowel sounds, without guarding, and without rebound.   Neurologic:  Alert and  oriented x4;  grossly normal neurologically.  Impression/Plan: [name] is here for an endoscopy  to be performed for  evaluation of abdominal pain     Risks, benefits, limitations, and alternatives regarding endoscopy have been reviewed with the patient.  Questions have been answered.  All parties agreeable.   [name], MD  04/09/2020, 9:43 AM

## 2020-04-09 NOTE — Transfer of Care (Signed)
Immediate Anesthesia Transfer of Care Note  Patient: [name]  Procedure(s) Performed: ESOPHAGOGASTRODUODENOSCOPY (EGD) (N/A )  Patient Location: PACU  Anesthesia Type:General  Level of Consciousness: awake and sedated  Airway & Oxygen Therapy: Patient Spontanous Breathing and Patient connected to nasal cannula oxygen  Post-op Assessment: Report given to RN and Post -op Vital signs reviewed and stable  Post vital signs: Reviewed and stable  Last Vitals:  Vitals Value Taken Time  BP    Temp    Pulse    Resp    SpO2      Last Pain:  Vitals:   04/09/20 0805  TempSrc: Oral  PainSc:       Patients Stated Pain Goal: 0 (04/08/20 2050)  Complications: No complications documented.

## 2020-04-09 NOTE — OR Nursing (Signed)
Patient that he was at the ' end of his rope'' that he didn't understand why he was not getting better. I asked him if he would like to talk to physciatry and he said yes. I asked him if he was having suicidal thoughts and he said '' I dont't want to talk about it. Don't make me be ugly to you''   I called patient's nurse robin and told her our conversation and ask that she talk with patients doctor for  psy . Consult.

## 2020-04-09 NOTE — Discharge Summary (Signed)
[facility] Discharge Summary   Patient: [name] WUJ:[id number]  PCP: [facility]  Date of admission: 04/06/2020   Date of discharge:  04/09/2020     Discharge Diagnoses:  Principal Problem:   Abdominal pain Active Problems:   SIRS (systemic inflammatory response syndrome) ([place])   Coronary artery disease involving native coronary artery of native heart with angina pectoris ([place])   Hyperlipidemia   Accelerated hypertension   Bipolar depression ([place])   Admitted From: Home Disposition:  Home   Recommendations for Outpatient Follow-up:  1. PCP: in 1 wk 2. F/u GI in  6 wks  For EGD in 6 wks 3. Follow up LABS/TEST: Follow-up biopsy report with GI   Diet recommendation: Cardiac diet  Activity: The patient is advised to gradually reintroduce usual activities, as tolerated  Discharge Condition: stable  Code Status: Full code   History of present illness: As per the H and P dictated on admission Hospital Course:  Victor Mizrahi Smithis a 60 y.o.malewith medical history significant ofhepatitis C, prior history of IV drug abuse, bipolar disorder, depression, GERD, coronary artery disease with previous pericarditis history who presented to the ER with 4 days of persistent nausea vomiting abdominal pain, watery diarrhea x 1 day. No tolerance for any oral intake. Patient was tachycardic HR 110 and with uncontrolled BP 160/112. Labs with leukocytosis ok 13.0k, mild anemia. Lactic acid up at 2.8. Probable AKI with Cr 1.33. Lipase elevated at 111. Pt had diffuse abdominal tenderness on presentation. CT abdomen pelvis with possible inferior developing splenic infarct, possible enteritis, possible gastroduodenitis, small fat-containing umbilical hernia as well as slightly increased in size of right kidney lesion about 11 mm. Abdominal U/S showed fatty infiltration of the liver and small gallbladder polyps. Splenic infarct was subsequently ruled out by MRI.Empiric  heparin was therefore stopped. Patient was on IV PPI for enteritis / duodenitis in addition to supportive care with IV fluids, as needed pain control and antiemetics.  # Abdominal pain due togastritis, duodenitis, acute pancreatitis-unclear etiology as to above. Splenic infarct was ruled out on MRI. Stop heparin. s/p  IV Protonix and empiric antibiotics until stool studies returned and negative. Lipase 111---72 trending down.  Diet as tolerated.GI consulted and s/p EGD, found to have gastritis and reflux esophagitis, GI recommended PPI 40 mg p.o. daily for 3 months, follow-up in 6 weeks and repeat EGD in 8 weeks.  Follow for biopsy report. # Acute kidney injury-present on admission, likely prerenal azotemia given poor p.o. intake and likely dehydrated. Baseline renal function a year ago was around 0.7-0.9. Presented with creatinine 1.33, Cr 1.1 after IV hydration, resolved. # Lactic acidosis-likely related to dehydration. Clinically do not suspect infection at this time. Patient remained afebrile.  WBC within normal range. # SIRS-met criteria with leukocytosis and tachycardia. Clinically doubt sepsis at this time.s/p empiric antibiotics, diarrhea resolved, so stool studies could not be obtained.  Blood culture negative for 3 days. # Leukocytosis-reactive versus infectious. Patient has been afebrile. No evidence of infection at this point.   Resolved, WBC count within normal range # Hypercalcemia-on admission calcium was 10.9, most likely due to dehydration.Normalized to 8.9 this morning likely from IV hydration. # Right renal mass-refer to radiology reports for further detail of partially exophytic 1.2 cm right interpolar lesion which could be cyst but cannot exclude neoplasm. Repeat CT or MRI was recommended in 3 to 6 months for further characterization and assessment of stability. Patient was informed and advised to follow with PCP. #  Pancreatic cystic lesion-at the head of the  pancreas seen on imaging.See reports. No suspicious enhancing features on MRI. Recommendation is pre and postcontrast MRI/MRCP ordered pancreatic protocol CT in 1 year. Patient was informed and advised to follow with PCP. Comorbidities: Coronary artery disease-stable, no active chest pain. Resumed aspirin.  Hypertension-uncontrolled on admission, possibly due to pain and also unable to keep down medication. Hold home enalapril due to AKI. Resumed on d/d.  Patient was advised to monitor BP at home and follow with PCP. Bipolar disorder-continue home gabapentin, Depakote, Cogentin, Seroquel, Prozac Body mass index is 26.89 kg/m.  Nutrition Interventions:  Patient was ambulatory without any assistance. On the day of the discharge the patient's vitals were stable, and no other acute medical condition were reported by patient. the patient was felt safe to be discharge at Home .  Consultants: GI Procedures: EGD  Discharge Exam: General: Appear in no distress, no Rash; Oral Mucosa Clear, moist. Cardiovascular: S1 and S2 Present, no Murmur, Respiratory: normal respiratory effort, Bilateral Air entry present and no Crackles, no wheezes Abdomen: Bowel Sound present, Soft and no tenderness, plical hernia, reducible, right-sided abdominal wall tenderness with palpable superficial lymph nodes??  Extremities: no Pedal edema, no calf tenderness Neurology: alert and oriented to time, place, and person affect appropriate.  Filed Weights   04/06/20 2029  Weight: 85 kg   Vitals:   04/09/20 1030 04/09/20 1052  BP: (!) 135/101 (!) 144/86  Pulse: 70 69  Resp: 12 17  Temp:  97.6 F (36.4 C)  SpO2: 100% 100%    DISCHARGE MEDICATION: Allergies as of 04/09/2020      Reactions   Toradol [ketorolac Tromethamine] Hives      Medication List    TAKE these medications   aspirin EC 325 MG tablet Take 1 tablet (325 mg total) by mouth daily.   benztropine 1 MG tablet Commonly known as:  COGENTIN Take 1 mg by mouth 2 (two) times daily.   divalproex 500 MG DR tablet Commonly known as: DEPAKOTE Take 500 mg by mouth 2 (two) times daily.   divalproex 500 MG 24 hr tablet Commonly known as: DEPAKOTE ER Take 500 mg by mouth 2 (two) times daily.   docusate sodium 100 MG capsule Commonly known as: Colace Take 1 capsule (100 mg total) by mouth 2 (two) times daily.   enalapril 10 MG tablet Commonly known as: VASOTEC Take 10 mg by mouth daily.   FLUoxetine 40 MG capsule Commonly known as: PROZAC Take 80 mg by mouth daily.   gabapentin 400 MG capsule Commonly known as: NEURONTIN Take 400 mg by mouth 3 (three) times daily.   methocarbamol 500 MG tablet Commonly known as: ROBAXIN Take 1 tablet (500 mg total) by mouth every 6 (six) hours.   multivitamin with minerals Tabs tablet Take 1 tablet by mouth daily.   Oxycodone HCl 10 MG Tabs Take 1 tablet (10 mg total) by mouth every 6 (six) hours as needed for severe pain or breakthrough pain.   pantoprazole 40 MG tablet Commonly known as: Protonix Take 1 tablet (40 mg total) by mouth daily.   polyethylene glycol 17 g packet Commonly known as: MIRALAX / GLYCOLAX Take 17 g by mouth daily as needed for mild constipation or moderate constipation.   SEROquel XR 400 MG 24 hr tablet Generic drug: QUEtiapine Take 400 mg by mouth at bedtime.   QUEtiapine 50 MG tablet Commonly known as: SEROQUEL Take 50 mg by mouth 4 (four) times daily as  needed (for agitation).   traMADol 50 MG tablet Commonly known as: ULTRAM Take 2 tablets (100 mg total) by mouth every 6 (six) hours.      Allergies  Allergen Reactions  . Toradol [Ketorolac Tromethamine] Hives   Discharge Instructions    Diet - low sodium heart healthy   Complete by: As directed    Discharge instructions   Complete by: As directed    Follow-up with PCP in 1 week, continue PPI 40 mg p.o. daily for 3 months follow-up with GI in 6 weeks, plan is to repeat EGD in  8 weeks   Increase activity slowly   Complete by: As directed       The results of significant diagnostics from this hospitalization (including imaging, microbiology, ancillary and laboratory) are listed below for reference.    Significant Diagnostic Studies: MR ABDOMEN W WO CONTRAST  Result Date: 04/07/2020 CLINICAL DATA:  Assess for splenic infarction. EXAM: MRI ABDOMEN WITHOUT AND WITH CONTRAST TECHNIQUE: Multiplanar multisequence MR imaging of the abdomen was performed both before and after the administration of intravenous contrast. CONTRAST:  33m GADAVIST GADOBUTROL 1 MMOL/ML IV SOLN COMPARISON:  CT abdomen pelvis April 06, 2020 FINDINGS: Lower chest: No acute abnormality. Hepatobiliary: Widening of the hepatic fissures with enlargement left lobe of the liver fluid. 1.1 cm cyst in the right lobe of the liver on the gallbladder fossa on image 15/6. No suspicious hepatic lesions. Gallbladder is distended without evidence of acute inflammation, commonly seen in a fasting state. No biliary ductal dilation. Pancreas: No pancreatic ductal dilation or evidence of acute inflammation. Cystic 6 mm lesion in the head of the pancreas without suspicious enhancing features on image 27/19. Spleen: Spleen is mildly enlarged measuring 13.7 cm in maximum craniocaudal dimension on image 10/3. No evidence of splenic infarction. Adrenals/Urinary Tract:  Bilateral adrenal glands are unremarkable. No hydronephrosis. Bilateral renal cysts measuring up to 1.1 cm on the left on image 32/19 and the largest on the right measuring 5 mm on image 21/19, and some of which would demonstrate a few thin internal septations such as the 8 mm left upper pole cyst on image 76/15. None of which demonstrate thickened septations or nodular enhancing components. Partially exophytic 1.2 cm right interpolar renal lesion on image 28/19 which demonstrates some intrinsic T1 shortening on image 67/11 there is no definite postcontrast enhancement.  However, there is significant motion on pre and postcontrast imaging limiting the utility subtraction sequences. Stomach/Bowel: Thickening and enhancement of the rugal folds as well as the wall of the antrum and duodenum with associated reduced diffusivity suggestive of gastritis/duodenitis. Otherwise, visualized portions within the abdomen are unremarkable. Vascular/Lymphatic: No pathologically enlarged lymph nodes identified. No abdominal aortic aneurysm demonstrated. Other:  None. Musculoskeletal: No suspicious bone lesions identified. IMPRESSION: 1. No evidence of splenic infarction. 2. Thickening and enhancement of the rugal folds as well as the wall of the antrum and the duodenum, with associated reduced diffusivity, suggestive of gastritis/duodenitis. 3. Widening of the hepatic fissures with enlargement of the left lobe of the liver fluid. Findings may be related to early cirrhosis. There is also mild splenomegaly. Correlate with any clinical evidence of liver disease or risk factors for liver disease. 4. Partially exophytic 1.2 cm right interpolar renal lesion which demonstrates some intrinsic T1 shortening but without definite postcontrast enhancement. However, there is significant motion on pre and postcontrast imaging limiting the utility subtraction sequences. While this may represent a hemorrhagic/proteinaceous cyst it is incompletely evaluated due to artifact from  respiratory motion and and indolent renal neoplasm is not completely excluded, recommend follow-up with renal protocol MRI or CT with and without contrast is 3-6 months for further characterization and to assess stability. 5. Cystic lesion in the head of the pancreas without suspicious enhancing features. 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 Recommend follow up pre and post contrast MRI/MRCP or pancreatic protocol CT in 1 year. This recommendation follows ACR consensus guidelines: Management of Incidental  Pancreatic Cysts: A White Paper of the ACR Incidental Findings Committee. J Am Coll Radiol 2017;14:911-923. 6. Bilateral Bosniak category 1 and 2 renal cysts. Electronically Signed   By: Dahlia Bailiff MD   On: 04/07/2020 01:39   CT ABDOMEN PELVIS W CONTRAST  Result Date: 04/06/2020 CLINICAL DATA:  Epigastric abdominal pain, more towards the right side EXAM: CT ABDOMEN AND PELVIS WITH CONTRAST TECHNIQUE: Multidetector CT imaging of the abdomen and pelvis was performed using the standard protocol following bolus administration of intravenous contrast. CONTRAST:  124m OMNIPAQUE IOHEXOL 300 MG/ML  SOLN COMPARISON:  CT 01/27/2016 FINDINGS: Lower chest: Atelectatic changes present in the otherwise clear lung bases. Normal heart size. No pericardial effusion. Hepatobiliary: Diffuse hepatic hypoattenuation compatible with hepatic steatosis. Stable 6 mm hypoattenuating focus adjacent gallbladder fossa small benign cysts though too small to fully characterize. No concerning new focal liver lesion. Normal gallbladder and biliary tree without visible calcified gallstone or ductal dilatation. Pancreas: No pancreatic ductal dilatation or surrounding inflammatory changes. Spleen: Which shaped region of peripheral hypoattenuation in the inferior right spleen (2/25), could reflect a site of developing splenic infarct. No other focal splenic lesion. Normal splenic size. Adrenals/Urinary Tract: Normal adrenal glands. Kidneys are normally located with symmetric enhancement and excretion. There are several scattered fluid attenuation cysts in both kidneys as well as additional smaller subcentimeter hypodense foci too small to fully characterize on CT imaging but statistically likely benign. A more focal, hyperattenuating focus is seen in the posterior interpolar right kidney measuring up to 11 mm in size, previously 7 mm and given increased from comparison Imaging in 2018 remains worrisome for potential renal malignancy. No other  suspicious renal lesion, urolithiasis or hydronephrosis. Urinary bladder is unremarkable for the degree of distention. Stomach/Bowel: Distal esophagus is free of acute abnormality. Some slight prominence of the rugal folds likely reflecting a combination contrast timing intramural fatty infiltration which could be seen in the setting of chronic or prior inflammatory change. Similar appearance of the duodenum. No other conspicuous bowel wall enhancement thickening. Slightly fluid-filled appearance of the distal small bowel is nonspecific. No colonic dilatation or wall thickening. Scattered colonic diverticula without focal inflammation to suggest diverticulitis. A normal appendix is visualized in the right mid abdomen without significant periappendiceal inflammatory change accounting for some motion artifact, particularly seen multiplanar reconstruction. Vascular/Lymphatic: Atherosclerotic calcifications within the abdominal aorta and branch vessels. No aneurysm or ectasia. Ostial and proximal plaque narrowing the splanchnic arteries is incompletely assessed on this angiographic technique. No enlarged abdominopelvic lymph nodes. Reproductive: The prostate and seminal vesicles are unremarkable. Other: No abdominopelvic free fluid or free gas. No bowel containing hernias. Small fat containing umbilical hernia minimal stranding of the herniated fat contents, correlate for point tenderness to exclude fat strangulation. Musculoskeletal: No acute osseous abnormality or suspicious osseous lesion. Levocurvature of the lumbar spine, apex L3. IMPRESSION: 1. Which shaped region of peripheral hypoattenuation in the inferior spleen, could reflect a site of developing splenic infarct. 2. Slightly fluid-filled appearance of the distal small bowel  is nonspecific, but can be seen with enteritis. 3. Some slight prominence of the rugal folds likely reflecting a combination contrast timing intramural fatty infiltration which could be  seen in the setting of chronic or prior inflammatory change with a similar appearance of the duodenum though should assess for clinical features of gastroduodenitis. 4. Small fat containing umbilical hernia with minimal stranding of the herniated fat contents, correlate for point tenderness to exclude fat strangulation. 5. A hyperattenuating focus in the posterior interpolar right kidney measuring up to 11 mm in size, previously 7 mm. Given increased size from comparison Imaging in 2018 remains worrisome for potential renal malignancy. Warrants further evaluation contrast enhanced MR imaging on an outpatient basis when patient is better able to tolerate the scanning procedure. 6. Hepatic steatosis. 7. Aortic Atherosclerosis (ICD10-I70.0). Electronically Signed   By: Lovena Le M.D.   On: 04/06/2020 22:51   US ABDOMEN LIMITED RUQ (LIVER/GB)  Result Date: 04/06/2020 CLINICAL DATA:  Right upper quadrant pain with nausea, initial encounter EXAM: ULTRASOUND ABDOMEN LIMITED RIGHT UPPER QUADRANT COMPARISON:  None. FINDINGS: Gallbladder: Gallbladder is well distended. A few small polyps are noted measuring 5 mm. No wall thickening or pericholecystic fluid is noted. Common bile duct: Diameter: 4.5 mm. Liver: Generalized increased echogenicity consistent with fatty infiltration. Focal area of fatty sparing is noted adjacent to the gallbladder fossa. Small cyst is noted measuring 1.4 cm within the liver. Portal vein is patent on color Doppler imaging with normal direction of blood flow towards the liver. Other: None. IMPRESSION: Fatty infiltration of the liver. Small gallbladder polyps. Electronically Signed   By: Inez Catalina M.D.   On: 04/06/2020 21:26    Microbiology: Recent Results (from the past 240 hour(s))  Blood culture (single)     Status: None (Preliminary result)   Collection Time: 04/06/20  8:37 PM   Specimen: BLOOD  Result Value Ref Range Status   Specimen Description BLOOD RIGHT ANTECUBITAL  Final    Special Requests   Final    BOTTLES DRAWN AEROBIC AND ANAEROBIC Blood Culture results may not be optimal due to an inadequate volume of blood received in culture bottles   Culture   Final    NO GROWTH 3 DAYS Performed at [facility], [address] [place], [place] [address]    Report Status PENDING  Incomplete  SARS CORONAVIRUS 2 (TAT 6-24 HRS) Nasopharyngeal Nasopharyngeal Swab     Status: None   Collection Time: 04/06/20  8:38 PM   Specimen: Nasopharyngeal Swab  Result Value Ref Range Status   SARS Coronavirus 2 NEGATIVE NEGATIVE Final    Comment: (NOTE) SARS-CoV-2 target nucleic acids are NOT DETECTED.  The SARS-CoV-2 RNA is generally detectable in upper and lower respiratory specimens during the acute phase of infection. Negative results do not preclude SARS-CoV-2 infection, do not rule out co-infections with other pathogens, and should not be used as the sole basis for treatment or other patient management decisions. Negative results must be combined with clinical observations, patient history, and epidemiological information. The expected result is Negative.  Fact Sheet for Patients: SugarRoll.be  Fact Sheet for Healthcare Providers: https://www.woods-mathews.com/  This test is not yet approved or cleared by the [place] FDA and  has been authorized for detection and/or diagnosis of SARS-CoV-2 by FDA under an Emergency Use Authorization (EUA). This EUA will remain  in effect (meaning this test can be used) for the duration of the COVID-19 declaration under Se ction 564(b)(1) of the Act, 21 U.S.C.  section 360bbb-3(b)(1), unless the authorization is terminated or revoked sooner.  Performed at [facility], [place] [address] [place], [place] [address]      Labs: CBC: Recent Labs  Lab 04/06/20 2037 04/07/20 0430 04/09/20 0518  WBC 13.0* 9.9 5.8  NEUTROABS 9.2*  --   --   HGB 18.8* 15.1 13.2  HCT  54.1* 43.5 38.4*  MCV 86.4 86.8 89.9  PLT 400 285 381   Basic Metabolic Panel: Recent Labs  Lab 04/06/20 2037 04/07/20 0430 04/08/20 0407 04/09/20 0518  NA 135 135 139 140  K 3.7 3.3* 3.7 3.9  CL 94* 101 111 111  CO2 22 26 21* 23  GLUCOSE 122* 144* 107* 101*  BUN 24* '18 11 12  ' CREATININE 1.33* 1.07 1.09 1.10  CALCIUM 10.9* 8.9 9.1 8.9  MG  --   --  2.1 2.1  PHOS  --   --   --  2.4*   Liver Function Tests: Recent Labs  Lab 04/06/20 2037 04/07/20 0430  AST 22 16  ALT 23 17  ALKPHOS 88 64  BILITOT 1.2 0.9  PROT 10.2* 7.3  ALBUMIN 5.7* 4.0   Recent Labs  Lab 04/06/20 2037 04/08/20 0407 04/09/20 0518  LIPASE 111* 72* 47   No results for input(s): AMMONIA in the last 168 hours. Cardiac Enzymes: Recent Labs  Lab 04/06/20 2037  CKTOTAL 113   BNP (last 3 results) No results for input(s): BNP in the last 8760 hours. CBG: No results for input(s): GLUCAP in the last 168 hours.  Time spent: 35 minutes  Signed:  Val Riles  [facility]  04/09/2020 1:09 PM

## 2020-04-09 NOTE — OR Nursing (Signed)
Patient that he was at the ' end of his rope'' that he didn't understand why he was not getting better. I asked him if he would like to talk to physciatry and he said yes. I asked him if he was having suicidal thoughts and he said '' I dont't want to talk about it. Don't make me be ugly to you''   I called patient's nurse robin and told her our conversation and ask that she talk with patients doctor for  psy . Consult.                                                                         

## 2020-04-09 NOTE — Anesthesia Preprocedure Evaluation (Addendum)
Anesthesia Evaluation  Patient identified by MRN, date of birth, ID band Patient awake    Reviewed: Allergy & Precautions, H&P , NPO status , Patient's Chart, lab work & pertinent test results  History of Anesthesia Complications Negative for: history of anesthetic complications  Airway Mallampati: II  TM Distance: >3 FB     Dental  (+) Edentulous Upper, Missing   Pulmonary neg pulmonary ROS, neg sleep apnea, neg COPD, former smoker,    breath sounds clear to auscultation       Cardiovascular hypertension, (-) angina+ CAD  (-) Past MI and (-) Cardiac Stents (-) dysrhythmias  Rhythm:regular Rate:Normal     Neuro/Psych PSYCHIATRIC DISORDERS Anxiety Depression Bipolar Disorder negative neurological ROS  negative psych ROS   GI/Hepatic GERD  ,(+)     substance abuse  IV drug use, NAFLD enteritis / duodenitis    Endo/Other  negative endocrine ROS  Renal/GU Renal disease (AKI)  negative genitourinary   Musculoskeletal   Abdominal   Peds  Hematology negative hematology ROS (+)   Anesthesia Other Findings Admitted with abd pain, diarrhea, imaging suggestive of enteritis / duodenitis  No N/V today  Past Medical History: No date: Anxiety No date: Arthritis     Comment:  knees and hands No date: Bipolar disorder (HCC) No date: Depression No date: GERD (gastroesophageal reflux disease) No date: Hepatitis     Comment:  HEP "C" No date: History of kidney stones No date: Hypertension 02/06/2018: Infection of prosthetic left knee joint (HCC) No date: Kidney stones 05/2015: Pericarditis     Comment:  a. echo 5/17: EF 60-65%, no RWMA, LV dias fxn nl, LA               mildly dilated, RV sys fxn nl, PASP nl, moderate sized               circumferential pericardial effusion was identified, 2.12              cm around the LV free wall, <1 cm around the RV free               wall. Features were not c/w tamponade  physiology No date: PTSD (post-traumatic stress disorder)     Comment:  Witnessed brother's suicide. No date: Restless leg syndrome No date: Syncope  Past Surgical History: No date: CYSTOSCOPY WITH URETEROSCOPY AND STENT PLACEMENT 01/11/2016: ESOPHAGOGASTRODUODENOSCOPY; N/A     Comment:  Procedure: ESOPHAGOGASTRODUODENOSCOPY (EGD);  Surgeon:               Charlott Rakes, MD;  Location: [facility] ENDOSCOPY;                Service: Endoscopy;  Laterality: N/A; 01/02/2018: INCISION AND DRAINAGE ABSCESS; Left     Comment:  Procedure: INCISION AND DRAINAGE LEFT KNEE;  Surgeon:               Deeann Saint, MD;  Location: [facility] ORS;  Service:               Orthopedics;  Laterality: Left; No date: JOINT REPLACEMENT; Right     Comment:  TKR 06/25/2014: KNEE ARTHROSCOPY; Right     Comment:  Procedure: ARTHROSCOPY KNEE;  Surgeon: Deeann Saint,               MD;  Location: [facility] ORS;  Service: Orthopedics;                Laterality: Right;  partial arthroscopic medial  menisectomy 04/22/2015: TOTAL KNEE ARTHROPLASTY; Right     Comment:  Procedure: TOTAL KNEE ARTHROPLASTY;  Surgeon: Deeann Saint, MD;  Location: [facility] ORS;  Service: Orthopedics;                Laterality: Right; 10/30/2017: TOTAL KNEE ARTHROPLASTY; Left     Comment:  Procedure: TOTAL KNEE ARTHROPLASTY;  Surgeon: Deeann Saint, MD;  Location: [facility] ORS;  Service: Orthopedics;                Laterality: Left; 01/02/2018: TOTAL KNEE REVISION; Left     Comment:  Procedure: poly exchange of tibia and patella left knee;              Surgeon: Deeann Saint, MD;  Location: [facility] ORS;                Service: Orthopedics;  Laterality: Left;  BMI    Body Mass Index: 26.89 kg/m      Reproductive/Obstetrics negative OB ROS                            Anesthesia Physical Anesthesia Plan  ASA: III  Anesthesia Plan: General   Post-op Pain Management:    Induction:    PONV Risk Score and Plan: Propofol infusion and TIVA  Airway Management Planned: Simple Face Mask  Additional Equipment:   Intra-op Plan:   Post-operative Plan:   Informed Consent: I have reviewed the patients History and Physical, chart, labs and discussed the procedure including the risks, benefits and alternatives for the proposed anesthesia with the patient or authorized representative who has indicated his/her understanding and acceptance.     Dental Advisory Given  Plan Discussed with: Anesthesiologist, CRNA and Surgeon  Anesthesia Plan Comments:         Anesthesia Quick Evaluation

## 2020-04-09 NOTE — Progress Notes (Signed)
Pt discharged via wheelchair by a volunteer to the [facility] entrance 

## 2020-04-09 NOTE — Progress Notes (Signed)
MD order received in [facility] to discharge pt home today; verbally reviewed AVS with pt including follow up appointments (pt to call PCP and Dr Johnney Killian offices to schedule appointments); Rx for Protonix escribed to the [facility] in [place], [place]; no questions voiced at this time; pt's discharge pending arrival of a volunteer to take the pt to the [facility] entrance where his brother is waiting

## 2020-04-09 NOTE — Anesthesia Procedure Notes (Signed)
Performed by: Tonia Ghent Pre-anesthesia Checklist: Emergency Drugs available, Patient identified, Suction available, Timeout performed and Patient being monitored Patient Re-evaluated:Patient Re-evaluated prior to induction Oxygen Delivery Method: Nasal cannula Preoxygenation: Pre-oxygenation with 100% oxygen Induction Type: IV induction Airway Equipment and Method: Bite block Placement Confirmation: positive ETCO2 and CO2 detector

## 2020-04-09 NOTE — Op Note (Signed)
[facility] Gastroenterology Patient Name: [name] Procedure Date: 04/09/2020 9:44 AM MRN: [id number] Account #: [id number] Date of Birth: [birth year] Admit Type: Outpatient Age: 60 Room: [facility] ENDO ROOM 4 Gender: Male Note Status: Finalized Procedure:             Upper GI endoscopy Indications:           Abdominal pain Providers:             [name] MD, MD Referring MD:          No Local Md, MD (Referring MD) Medicines:             Monitored Anesthesia Care Complications:         No immediate complications. Procedure:             Pre-Anesthesia Assessment:                        - Prior to the procedure, a History and Physical was                         performed, and patient medications, allergies and                         sensitivities were reviewed. The patient's tolerance                         of previous anesthesia was reviewed.                        - The risks and benefits of the procedure and the                         sedation options and risks were discussed with the                         patient. All questions were answered and informed                         consent was obtained.                        - Prior to the procedure, a History and Physical was                         performed, and patient medications, allergies and                         sensitivities were reviewed. The patient's tolerance                         of previous anesthesia was reviewed.                        - The risks and benefits of the procedure and the                         sedation options and risks were discussed with the  patient. All questions were answered and informed                         consent was obtained.                        - ASA Grade Assessment: II - A patient with mild                         systemic disease.                        After obtaining informed consent, the endoscope was                          passed under direct vision. Throughout the procedure,                         the patient's blood pressure, pulse, and oxygen                         saturations were monitored continuously. The Endoscope                         was introduced through the mouth, and advanced to the                         third part of duodenum. The upper GI endoscopy was                         accomplished with ease. The patient tolerated the                         procedure well. Findings:      LA Grade B (one or more mucosal breaks greater than 5 mm, not extending       between the tops of two mucosal folds) esophagitis with no bleeding was       found in the lower third of the esophagus.      The stomach was normal.      A medium amount of food (residue) was found in the cardia.      Localized mild inflammation characterized by congestion (edema) and       erythema was found in the gastric body. Biopsies were taken with a cold       forceps for histology.      Diffuse mildly erythematous mucosa without active bleeding and with no       stigmata of bleeding was found in the entire duodenum. Biopsies were       taken with a cold forceps for histology. Impression:            - LA Grade B reflux esophagitis with no bleeding.                        - Normal stomach.                        - A medium amount of food (residue) in the stomach.                        -  Gastritis. Biopsied.                        - Erythematous duodenopathy. Biopsied. Recommendation:        - Return patient to hospital ward for ongoing care.                        - Advance diet as tolerated.                        - Continue present medications.                        - Await pathology results.                        - Return to my office in 6 weeks.                        - Discharge home on prilosec 40 mg daily for 3 months,                         repeat EGD in 8 weeks to check for healing Procedure Code(s):     ---  Professional ---                        [phone], Esophagogastroduodenoscopy, flexible,                         transoral; with biopsy, single or multiple Diagnosis Code(s):     --- Professional ---                        K21.00, Gastro-esophageal reflux disease with                         esophagitis, without bleeding                        K29.70, Gastritis, unspecified, without bleeding                        K31.89, Other diseases of stomach and duodenum                        R10.9, Unspecified abdominal pain CPT copyright 2019 American Medical Association. All rights reserved. The codes documented in this report are preliminary and upon coder review may  be revised to meet current compliance requirements. [name], MD [name] MD, MD 04/09/2020 10:02:54 AM This report has been signed electronically. Number of Addenda: 0 Note Initiated On: 04/09/2020 9:44 AM Estimated Blood Loss:  Estimated blood loss: none.      [facility]

## 2020-04-10 LAB — SURGICAL PATHOLOGY

## 2020-04-11 LAB — CULTURE, BLOOD (SINGLE): Culture: NO GROWTH

## 2020-04-12 ENCOUNTER — Emergency Department: Payer: Medicaid Other

## 2020-04-12 ENCOUNTER — Emergency Department
Admission: EM | Admit: 2020-04-12 | Discharge: 2020-04-12 | Disposition: A | Discharge status: Home / Self Care | Payer: Medicaid Other | Attending: Emergency Medicine | Admitting: Emergency Medicine

## 2020-04-12 ENCOUNTER — Other Ambulatory Visit: Payer: Self-pay

## 2020-04-12 DIAGNOSIS — I10 Essential (primary) hypertension: Secondary | ICD-10-CM | POA: Diagnosis not present

## 2020-04-12 DIAGNOSIS — Z79899 Other long term (current) drug therapy: Secondary | ICD-10-CM | POA: Diagnosis not present

## 2020-04-12 DIAGNOSIS — Z7982 Long term (current) use of aspirin: Secondary | ICD-10-CM | POA: Insufficient documentation

## 2020-04-12 DIAGNOSIS — Z87891 Personal history of nicotine dependence: Secondary | ICD-10-CM | POA: Insufficient documentation

## 2020-04-12 DIAGNOSIS — R079 Chest pain, unspecified: Secondary | ICD-10-CM | POA: Insufficient documentation

## 2020-04-12 DIAGNOSIS — I2510 Atherosclerotic heart disease of native coronary artery without angina pectoris: Secondary | ICD-10-CM | POA: Diagnosis not present

## 2020-04-12 DIAGNOSIS — Z96653 Presence of artificial knee joint, bilateral: Secondary | ICD-10-CM | POA: Diagnosis not present

## 2020-04-12 DIAGNOSIS — R700 Elevated erythrocyte sedimentation rate: Secondary | ICD-10-CM | POA: Diagnosis not present

## 2020-04-12 DIAGNOSIS — M2550 Pain in unspecified joint: Secondary | ICD-10-CM

## 2020-04-12 LAB — CBC
HCT: 34.8 % — ABNORMAL LOW (ref 39.0–52.0)
Hemoglobin: 11.9 g/dL — ABNORMAL LOW (ref 13.0–17.0)
MCH: 30.1 pg (ref 26.0–34.0)
MCHC: 34.2 g/dL (ref 30.0–36.0)
MCV: 88.1 fL (ref 80.0–100.0)
Platelets: 202 10*3/uL (ref 150–400)
RBC: 3.95 MIL/uL — ABNORMAL LOW (ref 4.22–5.81)
RDW: 15 % (ref 11.5–15.5)
WBC: 11.2 10*3/uL — ABNORMAL HIGH (ref 4.0–10.5)
nRBC: 0 % (ref 0.0–0.2)

## 2020-04-12 LAB — TROPONIN I (HIGH SENSITIVITY)
Troponin I (High Sensitivity): 16 ng/L (ref <18)
Troponin I (High Sensitivity): 17 ng/L (ref <18)

## 2020-04-12 LAB — SEDIMENTATION RATE: Sed Rate: 56 mm/hr — ABNORMAL HIGH (ref 0–20)

## 2020-04-12 LAB — COMPREHENSIVE METABOLIC PANEL
ALT: 41 U/L (ref 0–44)
AST: 84 U/L — ABNORMAL HIGH (ref 15–41)
Albumin: 3.5 g/dL (ref 3.5–5.0)
Alkaline Phosphatase: 48 U/L (ref 38–126)
Anion gap: 8 (ref 5–15)
BUN: 19 mg/dL (ref 6–20)
CO2: 19 mmol/L — ABNORMAL LOW (ref 22–32)
Calcium: 8.8 mg/dL — ABNORMAL LOW (ref 8.9–10.3)
Chloride: 107 mmol/L (ref 98–111)
Creatinine, Ser: 1.1 mg/dL (ref 0.61–1.24)
GFR, Estimated: >60 mL/min (ref >60)
Glucose, Bld: 125 mg/dL — ABNORMAL HIGH (ref 70–99)
Potassium: 3.4 mmol/L — ABNORMAL LOW (ref 3.5–5.1)
Sodium: 134 mmol/L — ABNORMAL LOW (ref 135–145)
Total Bilirubin: 0.6 mg/dL (ref 0.3–1.2)
Total Protein: 6.8 g/dL (ref 6.5–8.1)

## 2020-04-12 LAB — LACTIC ACID, PLASMA: Lactic Acid, Venous: 1.7 mmol/L (ref 0.5–1.9)

## 2020-04-12 LAB — URIC ACID: Uric Acid, Serum: 7.5 mg/dL (ref 3.7–8.6)

## 2020-04-12 LAB — PROTIME-INR
INR: 1.3 — ABNORMAL HIGH (ref 0.8–1.2)
Prothrombin Time: 15.3 seconds — ABNORMAL HIGH (ref 11.4–15.2)

## 2020-04-12 MED ORDER — METHYLPREDNISOLONE SODIUM SUCC 125 MG IJ SOLR
125.0000 mg | Freq: Once | INTRAMUSCULAR | Status: AC
  Administered 2020-04-12: 125 mg via INTRAVENOUS
  Filled 2020-04-12: qty 2

## 2020-04-12 MED ORDER — MORPHINE SULFATE (PF) 4 MG/ML IV SOLN
4.0000 mg | Freq: Once | INTRAVENOUS | Status: AC
Start: 2020-04-12 — End: 2020-04-12
  Administered 2020-04-12: 4 mg via INTRAVENOUS
  Filled 2020-04-12: qty 1

## 2020-04-12 MED ORDER — MORPHINE SULFATE (PF) 4 MG/ML IV SOLN
4.0000 mg | Freq: Once | INTRAVENOUS | Status: AC
  Administered 2020-04-12: 4 mg via INTRAVENOUS
  Filled 2020-04-12: qty 1

## 2020-04-12 MED ORDER — PREDNISONE 10 MG PO TABS
10.0000 mg | ORAL_TABLET | Freq: Every day | ORAL | 0 refills | Status: DC
Start: 2020-04-12 — End: 2020-11-14

## 2020-04-12 MED ORDER — OXYCODONE HCL 5 MG PO TABS: 5.0000 mg | ORAL_TABLET | Freq: Three times a day (TID) | ORAL | 0 refills | Status: DC | PRN

## 2020-04-12 MED ORDER — ONDANSETRON HCL 4 MG/2ML IJ SOLN
4.0000 mg | Freq: Once | INTRAMUSCULAR | Status: AC
  Administered 2020-04-12: 4 mg via INTRAVENOUS
  Filled 2020-04-12: qty 2

## 2020-04-12 MED ORDER — IOHEXOL 350 MG/ML SOLN
100.0000 mL | Freq: Once | INTRAVENOUS | Status: AC | PRN
  Administered 2020-04-12: 100 mL via INTRAVENOUS

## 2020-04-12 NOTE — ED Provider Notes (Signed)
[facility] Emergency Department Provider Note  Time seen: 7:33 PM  I have reviewed the triage vital signs and the nursing notes.   HISTORY  Chief Complaint Chest Pain   HPI [name] is a 60 y.o. male with a past medical history anxiety, bipolar, depression, gastric reflux, hypertension, PTSD, presents to the emergency department with complaints of generalized pain chest pain worse with deep inspiration and reported fever 100.3 by EMS.  According to the patient in my record review he was discharged from the hospital 04/09/2020 after admission for abdominal pain meeting SIRS criteria, found to have pancreatitis gastritis duodenitis.  Patient states since going home he initially felt well until 2 days ago when he began feeling unwell again.  Denies any abdominal pain this time however states he has been having severe right chest pain worse with inspiration.  States a history of a pneumothorax requiring a chest tube approximately 1.5 years ago.  Denies any cough states subjective fever at home.  States he has noted pain in all of his joints especially his right wrist which is somewhat erythematous and swollen.  States he is also noted some rash to his lower extremities and feet.  Past Medical History:  Diagnosis Date  . Anxiety   . Arthritis    knees and hands  . Bipolar disorder ([place])   . Depression   . GERD (gastroesophageal reflux disease)   . Hepatitis    HEP "C"  . History of kidney stones   . Hypertension   . Infection of prosthetic left knee joint ([place]) 02/06/2018  . Kidney stones   . Pericarditis 05/2015   a. echo 5/17: EF 60-65%, no RWMA, LV dias fxn nl, LA mildly dilated, RV sys fxn nl, PASP nl, moderate sized circumferential pericardial effusion was identified, 2.12 cm around the LV free wall, <1 cm around the RV free wall. Features were not c/w tamponade physiology  . PTSD (post-traumatic stress disorder)    Witnessed brother's suicide.  Marland Kitchen  Restless leg syndrome   . Syncope     Patient Active Problem List   Diagnosis Date Noted  . Pneumothorax on right 01/21/2019  . Accelerated hypertension   . Bipolar depression ([place])   . Coronary artery calcification   . Impaired fasting glucose   . Multiple closed fractures of ribs of right side   . Infection of prosthetic left knee joint ([place]) 02/06/2018  . Sepsis ([place]) 12/31/2017  . History of esophagogastroduodenoscopy (EGD) 01/12/2016  . Esophagitis 01/12/2016  . Hypokalemia 01/12/2016  . Leukocytosis 01/12/2016  . Nausea and vomiting 01/11/2016  . Odynophagia 01/11/2016  . Abdominal pain 01/06/2016  . Anemia 06/02/2015  . Chest pain with moderate risk for cardiac etiology   . Coronary artery disease involving native coronary artery of native heart with angina pectoris ([place])   . Hyperlipidemia   . Pleuritic chest pain 06/01/2015  . Pericarditis with effusion 05/13/2015  . Chest pain at rest 05/12/2015  . Dyspnea 05/12/2015  . SIRS (systemic inflammatory response syndrome) ([place]) 05/12/2015  . 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 CYSTOSCOPY WITH URETEROSCOPY AND STENT PLACEMENT    . ESOPHAGOGASTRODUODENOSCOPY N/A 01/11/2016   Procedure: ESOPHAGOGASTRODUODENOSCOPY (EGD);  Surgeon: Wilford Corner, MD;  [facility] ENDOSCOPY;  Service: Endoscopy;  Laterality: N/A;  . ESOPHAGOGASTRODUODENOSCOPY N/A 04/09/2020   Procedure: ESOPHAGOGASTRODUODENOSCOPY (EGD);  Surgeon: Jonathon Bellows, MD;  Location: [facility] ENDOSCOPY;  Service: Gastroenterology;  Laterality: N/A;  .  INCISION AND DRAINAGE ABSCESS Left 01/02/2018   Procedure: INCISION AND DRAINAGE LEFT KNEE;  Surgeon: Earnestine Leys, MD;  Location: [facility] ORS;  Service: Orthopedics;  Laterality: Left;  . JOINT REPLACEMENT Right    TKR  . KNEE ARTHROSCOPY Right 06/25/2014   Procedure: ARTHROSCOPY KNEE;  Surgeon: Earnestine Leys, MD;  Location: [facility] ORS;  Service: Orthopedics;   Laterality: Right;  partial arthroscopic medial menisectomy  . TOTAL KNEE ARTHROPLASTY Right 04/22/2015   Procedure: TOTAL KNEE ARTHROPLASTY;  Surgeon: Earnestine Leys, MD;  Location: [facility] ORS;  Service: Orthopedics;  Laterality: Right;  . TOTAL KNEE ARTHROPLASTY Left 10/30/2017   Procedure: TOTAL KNEE ARTHROPLASTY;  Surgeon: Earnestine Leys, MD;  Location: [facility] ORS;  Service: Orthopedics;  Laterality: Left;  . TOTAL KNEE REVISION Left 01/02/2018   Procedure: poly exchange of tibia and patella left knee;  Surgeon: Earnestine Leys, MD;  Location: [facility] ORS;  Service: Orthopedics;  Laterality: Left;    Prior to Admission medications   Medication Sig Start Date End Date Taking? Authorizing Provider  aspirin EC 325 MG tablet Take 1 tablet (325 mg total) by mouth daily. 11/03/17   Earnestine Leys, MD  benztropine (COGENTIN) 1 MG tablet Take 1 mg by mouth 2 (two) times daily. 03/05/20   [provider]  divalproex (DEPAKOTE ER) 500 MG 24 hr tablet Take 500 mg by mouth 2 (two) times daily. 03/05/20   [provider]  divalproex (DEPAKOTE) 500 MG DR tablet Take 500 mg by mouth 2 (two) times daily. Patient not taking: Reported on 04/07/2020    [provider]  docusate sodium (COLACE) 100 MG capsule Take 1 capsule (100 mg total) by mouth 2 (two) times daily. Patient not taking: No sig reported 01/25/19   Tylene Fantasia, PA-C  enalapril (VASOTEC) 10 MG tablet Take 10 mg by mouth daily. 01/16/20   [provider]  FLUoxetine (PROZAC) 40 MG capsule Take 80 mg by mouth daily.     [provider]  gabapentin (NEURONTIN) 400 MG capsule Take 400 mg by mouth 3 (three) times daily. 03/24/20   [provider]  methocarbamol (ROBAXIN) 500 MG tablet Take 1 tablet (500 mg total) by mouth every 6 (six) hours. Patient not taking: No sig reported 01/25/19   Tylene Fantasia, PA-C  Multiple Vitamin (MULTIVITAMIN WITH MINERALS) TABS tablet Take 1 tablet by mouth daily.     [provider]  oxyCODONE 10 MG TABS Take 1 tablet (10 mg total) by mouth every 6 (six) hours as needed for severe pain or breakthrough pain. Patient not taking: No sig reported 01/25/19   Tylene Fantasia, PA-C  pantoprazole (PROTONIX) 40 MG tablet Take 1 tablet (40 mg total) by mouth daily. 04/09/20 07/08/20  Val Riles, MD  polyethylene glycol (MIRALAX / GLYCOLAX) 17 g packet Take 17 g by mouth daily as needed for mild constipation or moderate constipation. Patient not taking: No sig reported 01/25/19   Tylene Fantasia, PA-C  QUEtiapine (SEROQUEL) 50 MG tablet Take 50 mg by mouth 4 (four) times daily as needed (for agitation).     [provider]  SEROQUEL XR 400 MG 24 hr tablet Take 400 mg by mouth at bedtime.     [provider]  traMADol (ULTRAM) 50 MG tablet Take 2 tablets (100 mg total) by mouth every 6 (six) hours. Patient not taking: No sig reported 01/25/19   Tylene Fantasia, PA-C    Allergies  Allergen Reactions  . Toradol [Ketorolac Tromethamine] Hives  Family History  Problem Relation Age of Onset  . CVA Mother        deceased at age 31  . Depression Brother        Died by suicide at age 73    Social History Social History   Tobacco Use  . Smoking status: Former Smoker    Packs/day: 0.75    Years: 20.00    Pack years: 15.00    Types: Cigarettes    Quit date: 05/16/1984    Years since quitting: 35.9  . Smokeless tobacco: Never Used  Substance Use Topics  . Alcohol use: Yes    Comment: occ  . Drug use: No    Review of Systems Constitutional: Subjective fever at home. ENT: Negative for recent illness/congestion Cardiovascular: Right-sided chest pain worse with inspiration. Respiratory: Negative for shortness of breath.  Negative for cough. Gastrointestinal: Negative for abdominal pain, vomiting and diarrhea. Genitourinary: Negative for urinary compaints Musculoskeletal: Pain in all of his joints especially his right  wrist Skin: Rash to bilateral feet Neurological: Negative for headache All other ROS negative  ____________________________________________   PHYSICAL EXAM:  VITAL SIGNS: ED Triage Vitals  Enc Vitals Group     BP 04/12/20 1856 108/76     Pulse Rate 04/12/20 1856 96     Resp 04/12/20 1856 (!) 22     Temp 04/12/20 1856 99 F (37.2 C)     Temp Source 04/12/20 1856 Oral     SpO2 04/12/20 1855 100 %     Weight 04/12/20 1857 171 lb 15.3 oz (78 kg)     Height 04/12/20 1857 '5\' 10"'  (1.778 m)     Head Circumference --      Peak Flow --      Pain Score 04/12/20 1856 8     Pain Loc --      Pain Edu? --      Excl. in [place]? --    Constitutional: Alert and oriented. Well appearing and in no distress. Eyes: Normal exam ENT      Head: Normocephalic and atraumatic.      Mouth/Throat: Mucous membranes are moist. Cardiovascular: Normal rate, regular rhythm.  Respiratory: Normal respiratory effort without tachypnea nor retractions. Breath sounds are clear  Gastrointestinal: Soft and nontender. No distention.  Benign abdominal exam. Musculoskeletal: Patient states tenderness in both his hands feet and bilateral knees to palpation.  His right wrist he has mild swelling with mild erythema and moderate tenderness to this area. Neurologic:  Normal speech and language. No gross focal neurologic deficits Skin:  Skin is warm.  Does have small areas of rash to bilateral lower extremities could be consistent with petechial rash. Psychiatric: Mood and affect are normal.   ____________________________________________    EKG  EKG viewed and interpreted by myself shows a normal sinus rhythm at 91 bpm with a narrow QRS, normal axis, normal intervals, no concerning ST changes.  ____________________________________________    RADIOLOGY  Chest x-ray is negative. CTA is negative.  ____________________________________________   INITIAL IMPRESSION / ASSESSMENT AND PLAN / ED COURSE  Pertinent labs &  imaging results that were available during my care of the patient were reviewed by me and considered in my medical decision making (see chart for details).   Patient presents to the emergency department with multiple complaints of right-sided chest pain, joint pain, rash subjective fever.  We will check labs, CRP, ESR, uric acid obtain cultures and a lactic acid as a precaution.  We will obtain  a CTA of the chest to rule out pulmonary embolism or pneumothorax.  Patient agreeable to plan of care.  We will IV hydrate treat pain and nausea while awaiting results.  Patient's work-up is overall reassuring there is mild elevation in his ESR, CRP is pending.  Patient's troponin is negative, lactic acid of 1.7, uric acid within normal limits.  Patient CTA is negative.  In speaking to the patient in more detail he states for the past few months this has been occurring on a near weekly basis will he will have random joints swell up and become painful.  Given the patient's elevated ESR we will start the patient on steroids, we will prescribe a short course of pain medication have the patient follow-up with rheumatology for further work-up.  Patient states he has a PCP at [facility] clinic and will follow up with them as well.  I discussed return precautions for development of fever or worsening pain.  Jeneen Rinks Colon Hengel was evaluated in Emergency Department on 04/12/2020 for the symptoms described in the history of present illness. He was evaluated in the context of the global COVID-19 pandemic, which necessitated consideration that the patient might be at risk for infection with the SARS-CoV-2 virus that causes COVID-19. Institutional protocols and algorithms that pertain to the evaluation of patients at risk for COVID-19 are in a state of rapid change based on information released by regulatory bodies including the CDC and federal and state organizations. These policies and algorithms were followed during the patient's care in  the ED.  ____________________________________________   FINAL CLINICAL IMPRESSION(S) / ED DIAGNOSES  Joint pain Chest pain   Harvest Dark, MD 04/12/20 2314

## 2020-04-12 NOTE — ED Notes (Signed)
Attempted x2 for second IV and cultures. Called lab to request assist with blood draws.

## 2020-04-12 NOTE — ED Triage Notes (Signed)
Pt AOX4, appears uncomfortable. Per EMS, pt has multiple complaints. EMS report axillary temp of 100.3. Pt c/o right sided CP w/ inspiration that radiates into back, SOB, right wrist pain and vomiting x2 days. Pt was in hospital for pancreatitis but states he has not improved since being disdcharged. Right wrist is red and swollen, pt is hot to the touch.

## 2020-04-13 LAB — C-REACTIVE PROTEIN: CRP: 36.1 mg/dL — ABNORMAL HIGH (ref <1.0)

## 2020-04-16 ENCOUNTER — Encounter: Payer: Self-pay | Admitting: Gastroenterology

## 2020-04-17 LAB — CULTURE, BLOOD (ROUTINE X 2)
Culture: NO GROWTH
Culture: NO GROWTH
Special Requests: ADEQUATE
Special Requests: ADEQUATE

## 2020-11-13 ENCOUNTER — Emergency Department: Payer: Medicaid Other

## 2020-11-13 ENCOUNTER — Other Ambulatory Visit: Payer: Self-pay

## 2020-11-13 DIAGNOSIS — Z96653 Presence of artificial knee joint, bilateral: Secondary | ICD-10-CM | POA: Insufficient documentation

## 2020-11-13 DIAGNOSIS — M7989 Other specified soft tissue disorders: Secondary | ICD-10-CM | POA: Insufficient documentation

## 2020-11-13 DIAGNOSIS — Z87891 Personal history of nicotine dependence: Secondary | ICD-10-CM | POA: Diagnosis not present

## 2020-11-13 DIAGNOSIS — M25571 Pain in right ankle and joints of right foot: Secondary | ICD-10-CM | POA: Insufficient documentation

## 2020-11-13 DIAGNOSIS — I10 Essential (primary) hypertension: Secondary | ICD-10-CM | POA: Insufficient documentation

## 2020-11-13 DIAGNOSIS — Z79899 Other long term (current) drug therapy: Secondary | ICD-10-CM | POA: Diagnosis not present

## 2020-11-13 DIAGNOSIS — Z7982 Long term (current) use of aspirin: Secondary | ICD-10-CM | POA: Diagnosis not present

## 2020-11-13 DIAGNOSIS — Z760 Encounter for issue of repeat prescription: Secondary | ICD-10-CM | POA: Diagnosis not present

## 2020-11-13 DIAGNOSIS — I2510 Atherosclerotic heart disease of native coronary artery without angina pectoris: Secondary | ICD-10-CM | POA: Insufficient documentation

## 2020-11-13 NOTE — ED Triage Notes (Addendum)
Pt BIBA for c/o R ankle pain. Ankle red, warm to the touch, swollen. Pt endorses that his L great toe was like this yesterday. Denies trauma, scratches or bites to area.   Hx of arthritis but has no dx of gout in past. Pt states pain is radiating up his leg & into groin. States that L great toe self resolved.   No attempt at intervention PTA.   Pt also notes he has abruptly stopped all medications including seizure, psych & BP meds.

## 2020-11-14 ENCOUNTER — Emergency Department: Payer: Medicaid Other

## 2020-11-14 ENCOUNTER — Emergency Department
Admission: EM | Admit: 2020-11-14 | Discharge: 2020-11-14 | Disposition: A | Discharge status: Home / Self Care | Payer: Medicaid Other | Attending: Emergency Medicine | Admitting: Emergency Medicine

## 2020-11-14 DIAGNOSIS — M25571 Pain in right ankle and joints of right foot: Secondary | ICD-10-CM

## 2020-11-14 DIAGNOSIS — Z760 Encounter for issue of repeat prescription: Secondary | ICD-10-CM

## 2020-11-14 LAB — CBC WITH DIFFERENTIAL/PLATELET
Abs Immature Granulocytes: 0.04 10*3/uL (ref 0.00–0.07)
Basophils Absolute: 0 10*3/uL (ref 0.0–0.1)
Basophils Relative: 0 %
Eosinophils Absolute: 0 10*3/uL (ref 0.0–0.5)
Eosinophils Relative: 0 %
HCT: 38.7 % — ABNORMAL LOW (ref 39.0–52.0)
Hemoglobin: 13.2 g/dL (ref 13.0–17.0)
Immature Granulocytes: 0 %
Lymphocytes Relative: 13 %
Lymphs Abs: 1.6 10*3/uL (ref 0.7–4.0)
MCH: 29.5 pg (ref 26.0–34.0)
MCHC: 34.1 g/dL (ref 30.0–36.0)
MCV: 86.6 fL (ref 80.0–100.0)
Monocytes Absolute: 1 10*3/uL (ref 0.1–1.0)
Monocytes Relative: 8 %
Neutro Abs: 9.4 10*3/uL — ABNORMAL HIGH (ref 1.7–7.7)
Neutrophils Relative %: 79 %
Platelets: 269 10*3/uL (ref 150–400)
RBC: 4.47 MIL/uL (ref 4.22–5.81)
RDW: 16.1 % — ABNORMAL HIGH (ref 11.5–15.5)
WBC: 12.1 10*3/uL — ABNORMAL HIGH (ref 4.0–10.5)
nRBC: 0 % (ref 0.0–0.2)

## 2020-11-14 LAB — HEPATIC FUNCTION PANEL
ALT: 18 U/L (ref 0–44)
AST: 29 U/L (ref 15–41)
Albumin: 4 g/dL (ref 3.5–5.0)
Alkaline Phosphatase: 68 U/L (ref 38–126)
Bilirubin, Direct: 0.2 mg/dL (ref 0.0–0.2)
Indirect Bilirubin: 0.8 mg/dL (ref 0.3–0.9)
Total Bilirubin: 1 mg/dL (ref 0.3–1.2)
Total Protein: 7.8 g/dL (ref 6.5–8.1)

## 2020-11-14 LAB — BASIC METABOLIC PANEL
Anion gap: 9 (ref 5–15)
BUN: 13 mg/dL (ref 6–20)
CO2: 24 mmol/L (ref 22–32)
Calcium: 9.7 mg/dL (ref 8.9–10.3)
Chloride: 102 mmol/L (ref 98–111)
Creatinine, Ser: 0.93 mg/dL (ref 0.61–1.24)
GFR, Estimated: >60 mL/min (ref >60)
Glucose, Bld: 141 mg/dL — ABNORMAL HIGH (ref 70–99)
Potassium: 4.5 mmol/L (ref 3.5–5.1)
Sodium: 135 mmol/L (ref 135–145)

## 2020-11-14 LAB — LACTIC ACID, PLASMA: Lactic Acid, Venous: 1.2 mmol/L (ref 0.5–1.9)

## 2020-11-14 LAB — LIPASE, BLOOD: Lipase: 36 U/L (ref 11–51)

## 2020-11-14 MED ORDER — DIVALPROEX SODIUM 500 MG PO DR TAB
500.0000 mg | DELAYED_RELEASE_TABLET | Freq: Two times a day (BID) | ORAL | Status: DC
  Administered 2020-11-14: 500 mg via ORAL
  Filled 2020-11-14: qty 1

## 2020-11-14 MED ORDER — ENALAPRIL MALEATE 10 MG PO TABS: 10.0000 mg | ORAL_TABLET | Freq: Every day | ORAL | 0 refills | Status: DC

## 2020-11-14 MED ORDER — DOXYCYCLINE HYCLATE 100 MG PO TABS
100.0000 mg | ORAL_TABLET | Freq: Once | ORAL | Status: AC
  Administered 2020-11-14: 100 mg via ORAL
  Filled 2020-11-14: qty 1

## 2020-11-14 MED ORDER — FLUOXETINE HCL 40 MG PO CAPS: 80.0000 mg | ORAL_CAPSULE | Freq: Every day | ORAL | 0 refills | Status: DC

## 2020-11-14 MED ORDER — QUETIAPINE FUMARATE 25 MG PO TABS
50.0000 mg | ORAL_TABLET | Freq: Four times a day (QID) | ORAL | Status: DC | PRN
  Filled 2020-11-14: qty 2

## 2020-11-14 MED ORDER — PANTOPRAZOLE SODIUM 40 MG PO TBEC: 40.0000 mg | DELAYED_RELEASE_TABLET | Freq: Every day | ORAL | 2 refills | Status: DC

## 2020-11-14 MED ORDER — ONDANSETRON 4 MG PO TBDP
ORAL_TABLET | ORAL | Status: AC
  Administered 2020-11-14: 4 mg via ORAL
  Filled 2020-11-14: qty 1

## 2020-11-14 MED ORDER — OXYCODONE-ACETAMINOPHEN 5-325 MG PO TABS: 1.0000 | ORAL_TABLET | Freq: Three times a day (TID) | ORAL | 0 refills | Status: AC | PRN

## 2020-11-14 MED ORDER — DIVALPROEX SODIUM 500 MG PO DR TAB: 500.0000 mg | DELAYED_RELEASE_TABLET | Freq: Two times a day (BID) | ORAL | 0 refills | Status: DC

## 2020-11-14 MED ORDER — OXYCODONE-ACETAMINOPHEN 5-325 MG PO TABS
1.0000 | ORAL_TABLET | Freq: Once | ORAL | Status: AC
Start: 2020-11-14 — End: 2020-11-14
  Administered 2020-11-14: 1 via ORAL
  Filled 2020-11-14: qty 1

## 2020-11-14 MED ORDER — LIDOCAINE HCL (PF) 1 % IJ SOLN
30.0000 mL | Freq: Once | INTRAMUSCULAR | Status: AC
  Administered 2020-11-14: 30 mL via INTRADERMAL
  Filled 2020-11-14: qty 30

## 2020-11-14 MED ORDER — DOXYCYCLINE HYCLATE 100 MG PO CAPS: 100.0000 mg | ORAL_CAPSULE | Freq: Two times a day (BID) | ORAL | 0 refills | Status: AC

## 2020-11-14 MED ORDER — SEROQUEL XR 400 MG PO TB24: 400.0000 mg | ORAL_TABLET | Freq: Every day | ORAL | 0 refills | Status: DC

## 2020-11-14 MED ORDER — ENALAPRIL MALEATE 10 MG PO TABS
10.0000 mg | ORAL_TABLET | Freq: Every day | ORAL | Status: DC
  Administered 2020-11-14: 10 mg via ORAL
  Filled 2020-11-14: qty 1

## 2020-11-14 MED ORDER — DOCUSATE SODIUM 100 MG PO CAPS: 100.0000 mg | ORAL_CAPSULE | Freq: Two times a day (BID) | ORAL | 0 refills | Status: DC

## 2020-11-14 MED ORDER — OXYCODONE-ACETAMINOPHEN 5-325 MG PO TABS
1.0000 | ORAL_TABLET | Freq: Once | ORAL | Status: AC
  Administered 2020-11-14: 1 via ORAL
  Filled 2020-11-14: qty 1

## 2020-11-14 MED ORDER — ONDANSETRON 4 MG PO TBDP: 4.0000 mg | ORAL_TABLET | Freq: Once | ORAL | Status: AC

## 2020-11-14 MED ORDER — PANTOPRAZOLE SODIUM 40 MG PO TBEC
40.0000 mg | DELAYED_RELEASE_TABLET | Freq: Every day | ORAL | Status: DC
  Administered 2020-11-14: 40 mg via ORAL
  Filled 2020-11-14: qty 1

## 2020-11-14 NOTE — ED Provider Notes (Signed)
[facility] Emergency Department Provider Note  ____________________________________________   Event Date/Time   First MD Initiated Contact with Patient 11/14/20 [phone]     (approximate)  I have reviewed the triage vital signs and the nursing notes.   HISTORY  Chief Complaint Ankle Pain   HPI [name] is a 60 y.o. male with a past medical history of anxiety, arthritis, bipolar disorder, depression, GERD, hep C, kidney stones, HTN, RLS, PTSD who presents for assessment of some pain redness and swelling around his right ankle that he states began 3 days ago.  He denies any recent injuries falls or trauma although notes he has been working on his feet a lot.  He states he had an area of redness and swelling on the toes in his left foot couple days ago that resolved on its own.   He is not sure which toe.  He also states he had an abdominal cramp there today that is also resolved. He has not had any earache, sore throat, chest pain, cough, back pain, other areas of redness tenderness or pain or other clear associated sick symptoms.  He has no history of gout.  He denies any illicit drug use or IV drug use.  Denies any recent steroids.  He has no other acute concerns at this time.         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 echo 5/17: EF 60-65%, no RWMA, LV dias fxn nl, LA mildly dilated, RV sys fxn nl, PASP nl, moderate sized circumferential pericardial effusion was identified, 2.12 cm around the LV free wall, <1 cm around the RV free wall. Features were not c/w tamponade physiology   PTSD (post-traumatic stress disorder)    Witnessed brother's suicide.    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   Sepsis ([place]) 12/31/2017   History of esophagogastroduodenoscopy (EGD) 01/12/2016   Esophagitis 01/12/2016   Hypokalemia 01/12/2016   Leukocytosis 01/12/2016   Nausea and vomiting 01/11/2016   Odynophagia 01/11/2016   Abdominal pain 01/06/2016   Anemia 06/02/2015   Chest pain with moderate risk for cardiac etiology    Coronary artery disease involving native coronary artery of native heart with angina pectoris ([place])    Hyperlipidemia    Pleuritic chest pain 06/01/2015   Pericarditis with effusion 05/13/2015   Chest pain at rest 05/12/2015   Dyspnea 05/12/2015   SIRS (systemic inflammatory response syndrome) (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    Past Surgical History:  Procedure Laterality Date   CYSTOSCOPY WITH URETEROSCOPY AND STENT PLACEMENT     ESOPHAGOGASTRODUODENOSCOPY N/A 01/11/2016   Procedure: ESOPHAGOGASTRODUODENOSCOPY (EGD);  Surgeon: Wilford Corner, MD;  Location[facility] ENDOSCOPY;  Service:  Endoscopy;  Laterality: N/A;   ESOPHAGOGASTRODUODENOSCOPY N/A 04/09/2020   Procedure: ESOPHAGOGASTRODUODENOSCOPY (EGD);  Surgeon: Jonathon Bellows, MD;  Location[facility] ENDOSCOPY;  Service: Gastroenterology;  Laterality: N/A;   INCISION AND DRAINAGE ABSCESS Left 01/02/2018   Procedure: INCISION AND DRAINAGE LEFT KNEE;  Surgeon: Earnestine Leys, MD;  Location: [facility] ORS;  Service: Orthopedics;  Laterality: Left;   JOINT REPLACEMENT Right    TKR   KNEE ARTHROSCOPY Right 06/25/2014   Procedure: ARTHROSCOPY KNEE;  Surgeon: Earnestine Leys, MD;  Location: [facility] ORS;  Service: Orthopedics;  Laterality: Right;  partial  arthroscopic medial menisectomy   TOTAL KNEE ARTHROPLASTY Right 04/22/2015   Procedure: TOTAL KNEE ARTHROPLASTY;  Surgeon: Earnestine Leys, MD;  Location: [facility] ORS;  Service: Orthopedics;  Laterality: Right;   TOTAL KNEE ARTHROPLASTY Left 10/30/2017   Procedure: TOTAL KNEE ARTHROPLASTY;  Surgeon: Earnestine Leys, MD;  Location: [facility] ORS;  Service: Orthopedics;  Laterality: Left;   TOTAL KNEE REVISION Left 01/02/2018   Procedure: poly exchange of tibia and patella left knee;  Surgeon: Earnestine Leys, MD;  Location: [facility] ORS;  Service: Orthopedics;  Laterality: Left;    Prior to Admission medications   Medication Sig Start Date End Date Taking? Authorizing Provider  doxycycline (VIBRAMYCIN) 100 MG capsule Take 1 capsule (100 mg total) by mouth 2 (two) times daily for 10 days. 11/14/20 11/24/20 Yes Lucrezia Starch, MD  oxyCODONE-acetaminophen (PERCOCET) 5-325 MG tablet Take 1 tablet by mouth every 8 (eight) hours as needed for up to 5 days for severe pain. 11/14/20 11/19/20 Yes Lucrezia Starch, MD  aspirin EC 325 MG tablet Take 1 tablet (325 mg total) by mouth daily. 11/03/17   Earnestine Leys, MD  benztropine (COGENTIN) 1 MG tablet Take 1 mg by mouth 2 (two) times daily. 03/05/20   [provider]  divalproex (DEPAKOTE ER) 500 MG 24 hr tablet Take 500 mg by mouth 2 (two) times daily. 03/05/20   [provider]  divalproex (DEPAKOTE) 500 MG DR tablet Take 1 tablet (500 mg total) by mouth 2 (two) times daily. 11/14/20 12/14/20  Lucrezia Starch, MD  docusate sodium (COLACE) 100 MG capsule Take 1 capsule (100 mg total) by mouth 2 (two) times daily. 11/14/20   Lucrezia Starch, MD  enalapril (VASOTEC) 10 MG tablet Take 1 tablet (10 mg total) by mouth daily. 11/14/20 12/14/20  Lucrezia Starch, MD  FLUoxetine (PROZAC) 40 MG capsule Take 2 capsules (80 mg total) by mouth daily. 11/14/20 12/14/20  Lucrezia Starch, MD  gabapentin (NEURONTIN) 400 MG capsule Take 400 mg by mouth 3 (three) times daily.  03/24/20   [provider]  Multiple Vitamin (MULTIVITAMIN WITH MINERALS) TABS tablet Take 1 tablet by mouth daily.    [provider]  oxyCODONE (ROXICODONE) 5 MG immediate release tablet Take 1 tablet (5 mg total) by mouth every 8 (eight) hours as needed. 04/12/20 04/12/21  Harvest Dark, MD  pantoprazole (PROTONIX) 40 MG tablet Take 1 tablet (40 mg total) by mouth daily. 11/14/20 02/12/21  Lucrezia Starch, MD  polyethylene glycol (MIRALAX / GLYCOLAX) 17 g packet Take 17 g by mouth daily as needed for mild constipation or moderate constipation. Patient not taking: No sig reported 01/25/19   Tylene Fantasia, PA-C  QUEtiapine (SEROQUEL) 50 MG tablet Take 50 mg by mouth 4 (four) times daily as needed (for agitation).     [provider]  SEROQUEL XR 400 MG 24 hr tablet Take 1 tablet (400 mg total) by mouth  at bedtime. 11/14/20 12/14/20  Lucrezia Starch, MD  traMADol (ULTRAM) 50 MG tablet Take 2 tablets (100 mg total) by mouth every 6 (six) hours. Patient not taking: No sig reported 01/25/19   Tylene Fantasia, PA-C    Allergies Toradol [ketorolac tromethamine]  Family History  Problem Relation Age of Onset   CVA Mother        deceased at age 53   Depression Brother        Died by suicide at age 41    Social History Social History   Tobacco Use   Smoking status: Former    Packs/day: 0.75    Years: 20.00    Pack years: 15.00    Types: Cigarettes    Quit date: 05/16/1984    Years since quitting: 36.5   Smokeless tobacco: Never  Substance Use Topics   Alcohol use: Yes    Comment: occ   Drug use: Yes    Frequency: 5.0 times per week    Types: Marijuana    Comment: marijuana    Review of Systems  Review of Systems  Constitutional:  Negative for chills and fever.  HENT:  Negative for sore throat.   Eyes:  Negative for pain.  Respiratory:  Negative for cough and stridor.   Cardiovascular:  Negative for chest pain.  Gastrointestinal:  Negative for  vomiting.  Musculoskeletal:  Positive for joint pain.  Skin:  Negative for rash.  Neurological:  Negative for seizures, loss of consciousness and headaches.  Psychiatric/Behavioral:  Negative for suicidal ideas.   All other systems reviewed and are negative.    ____________________________________________   PHYSICAL EXAM:  VITAL SIGNS: ED Triage Vitals  Enc Vitals Group     BP 11/13/20 2052 (!) 157/99     Pulse Rate 11/13/20 2052 (!) 103     Resp 11/13/20 2052 20     Temp 11/13/20 2052 99 F (37.2 C)     Temp Source 11/13/20 2052 Oral     SpO2 11/13/20 2052 96 %     Weight 11/14/20 0735 171 lb 15.3 oz (78 kg)     Height 11/14/20 0735 5\' 10"  (1.778 m)     Head Circumference --      Peak Flow --      Pain Score --      Pain Loc --      Pain Edu? --      Excl. in [place]? --    Vitals:   11/14/20 0730 11/14/20 0900  BP: (!) 163/105 (!) 141/95  Pulse: 95 73  Resp: 18 20  Temp:    SpO2: 98% 98%   Physical Exam Vitals and nursing note reviewed.  Constitutional:      Appearance: He is well-developed.  HENT:     Head: Normocephalic and atraumatic.     Right Ear: External ear normal.     Left Ear: External ear normal.     Nose: Nose normal.     Mouth/Throat:     Mouth: Mucous membranes are moist.  Eyes:     Conjunctiva/sclera: Conjunctivae normal.  Cardiovascular:     Rate and Rhythm: Normal rate and regular rhythm.     Heart sounds: No murmur heard. Pulmonary:     Effort: Pulmonary effort is normal. No respiratory distress.     Breath sounds: Normal breath sounds.  Abdominal:     Palpations: Abdomen is soft.     Tenderness: There is no abdominal tenderness.  Musculoskeletal:  Cervical back: Neck supple.  Skin:    General: Skin is warm and dry.     Capillary Refill: Capillary refill takes less than 2 seconds.  Neurological:     Mental Status: He is alert and oriented to person, place, and time.  Psychiatric:        Mood and Affect: Mood normal.    There is  some erythema and tenderness and edema over the medial malleolus and lateral malleolus without any significant tenderness erythema or edema over the anterior or posterior ankle.  Calf is unremarkable.  2+ DP pulse.  Sensation intact light touch of the lower extremities.  No other areas of significant redness tenderness swelling or pain including in the toes, plantar aspect of both feet on the dorsum of both feet bilaterally.  No penetrating wounds or other evidence of trauma.  Patient has decreased range of motion on plantar and dorsiflexion of the right ankle but otherwise has full strength of the right lower extremity. ____________________________________________   LABS (all labs ordered are listed, but only abnormal results are displayed)  Labs Reviewed  CBC WITH DIFFERENTIAL/PLATELET - Abnormal; Notable for the following components:      Result Value   WBC 12.1 (*)    HCT 38.7 (*)    RDW 16.1 (*)    Neutro Abs 9.4 (*)    All other components within normal limits  BASIC METABOLIC PANEL - Abnormal; Notable for the following components:   Glucose, Bld 141 (*)    All other components within normal limits  CULTURE, BLOOD (ROUTINE X 2)  CULTURE, BLOOD (ROUTINE X 2)  BODY FLUID CULTURE W GRAM STAIN  LACTIC ACID, PLASMA  HEPATIC FUNCTION PANEL  LIPASE, BLOOD  GLUCOSE, BODY FLUID OTHER            PROTEIN, BODY FLUID (OTHER)  SYNOVIAL CELL COUNT + DIFF, W/ CRYSTALS   ____________________________________________  EKG  ECG remarkable for sinus rhythm with a ventricular rate of 71, some artifact in aVL without any clearance of acute ischemia or significant arrhythmia ____________________________________________  RADIOLOGY  ED MD interpretation: Plain film of the right ankle shows no acute fracture dislocation or significant effusion.  There is some lateral soft tissue swelling.  Ultrasound of the right lower extremity shows no evidence of DVT.  Official radiology report(s): DG Ankle  Complete Right  Result Date: 11/13/2020 CLINICAL DATA:  Ankle pain and swelling, initial encounter EXAM: RIGHT ANKLE - COMPLETE 3+ VIEW COMPARISON:  None. FINDINGS: No acute fracture or dislocation is noted. Lateral soft tissue swelling is noted consistent with the clinical history. No other focal abnormality is noted. IMPRESSION: Soft tissue swelling laterally without acute bony abnormality. Electronically Signed   By: Inez Catalina M.D.   On: 11/13/2020 23:33   US Venous Img Lower Unilateral Right  Result Date: 11/14/2020 CLINICAL DATA:  Right leg pain. EXAM: Right LOWER EXTREMITY VENOUS DOPPLER ULTRASOUND TECHNIQUE: Gray-scale sonography with compression, as well as color and duplex ultrasound, were performed to evaluate the deep venous system(s) from the level of the common femoral vein through the popliteal and proximal calf veins. COMPARISON:  None. FINDINGS: VENOUS Normal compressibility of the common femoral, superficial femoral, and popliteal veins, as well as the visualized calf veins. Visualized portions of profunda femoral vein and great saphenous vein unremarkable. No filling defects to suggest DVT on grayscale or color Doppler imaging. Doppler waveforms show normal direction of venous flow, normal respiratory plasticity and response to augmentation. Limited views of the contralateral  common femoral vein are unremarkable. OTHER None. Limitations: none IMPRESSION: Negative. Electronically Signed   By: Elgie Collard M.D.   On: 11/14/2020 01:51    ____________________________________________   PROCEDURES  Procedure(s) performed (including Critical Care):  .Joint Aspiration/Arthrocentesis  Date/Time: 11/14/2020 10:46 AM Performed by: Gilles Chiquito, MD Authorized by: Gilles Chiquito, MD   Consent:    Consent obtained:  Verbal   Consent given by:  Patient   Risks, benefits, and alternatives were discussed: yes     Risks discussed:  Bleeding, infection, incomplete drainage and  pain   Alternatives discussed:  No treatment Universal protocol:    Procedure explained and questions answered to patient or proxy's satisfaction: yes     Patient identity confirmed:  Verbally with patient Location:    Location:  Ankle   Ankle:  R ankle Anesthesia:    Anesthesia method:  None Procedure details:    Needle gauge:  18 G   Ultrasound guidance: no     Approach:  Anterior   Aspirate characteristics:  Bloody   Steroid injected: no     Specimen collected: yes   Post-procedure details:    Dressing:  Adhesive bandage   Procedure completion:  Tolerated well, no immediate complications   ____________________________________________   INITIAL IMPRESSION / ASSESSMENT AND PLAN / ED COURSE      Patient presents with above-stated history exam for assessment of approximately 3 days of some nontraumatic pain in the right ankle as well as in the epigastric area left upper quadrant abdominal discomfort seem to have resolved on its own earlier today.  On arrival he felt hypertensive otherwise stable vital signs on room air.  He notes he has been out of all of his medications for over a month and is requesting refills of all of these.  He was given home doses of his enalapril and Depakote and refills were written.  With regard to the epigastric discomfort is possible this is related to some gastritis or GERD.  ECG is not suggestive of atypical presentation for ACS or arrhythmia.  On exam his abdomen is soft nontender throughout I have very low suspicion for diverticulitis, cholecystitis or pancreatitis.  Hepatic function panel is unremarkable aside from slightly elevated AST.  Lipase not consistent with pancreatitis.  Given this is now resolved think this can be followed up outpatient.  With regard to the right ankle there is no history exam features or findings on x-ray to suggest trauma.  Ultrasound shows no evidence of DVT.  Given there is no large effusion and the edema does not  seem circumferential have a lower suspicion for a septic joint.  Suspect likely cellulitis versus gout or pseudogout.  Attempted to aspirate some fluid to confirm this diagnosis although was only able to obtain a small amount that was sent for culture.  In triage patient did have initial sepsis screening labs sent including a blood culture and lactic acid due to slight heart rate elevation in triage.  On my assessment he is not tachycardic.  CBC otherwise has a very slight leukocytosis with WBC count of 12.1 with normal hemoglobin and platelets.  BMP shows no significant electrolyte metabolic derangements.  At this time I am more suspicious for cellulitis than gout and.  Will cover with a course of Doxy as I do not believe patient is currently septic.  Advised him to follow-up with PCP in 2 to 3 days and return immediately if he experiences any worsening of his pain or  spreading of the redness or any fevers.  He is amenable with plan.  In addition to refill his medications Rx for Doxy and analgesia provided.  Discharged stable condition.  Strict return precautions advised and discussed.    ____________________________________________   FINAL CLINICAL IMPRESSION(S) / ED DIAGNOSES  Final diagnoses:  Acute right ankle pain  Medication refill    Medications  divalproex (DEPAKOTE) DR tablet 500 mg (500 mg Oral Given 11/14/20 0905)  enalapril (VASOTEC) tablet 10 mg (10 mg Oral Given 11/14/20 0905)  pantoprazole (PROTONIX) EC tablet 40 mg (40 mg Oral Given 11/14/20 0905)  QUEtiapine (SEROQUEL) tablet 50 mg (has no administration in time range)  oxyCODONE-acetaminophen (PERCOCET/ROXICET) 5-325 MG per tablet 1 tablet (1 tablet Oral Given 11/14/20 0113)  ondansetron (ZOFRAN-ODT) disintegrating tablet 4 mg (4 mg Oral Given 11/14/20 0113)  oxyCODONE-acetaminophen (PERCOCET/ROXICET) 5-325 MG per tablet 1 tablet (1 tablet Oral Given 11/14/20 0558)  lidocaine (PF) (XYLOCAINE) 1 % injection 30 mL (30 mLs  Intradermal Given 11/14/20 0829)  doxycycline (VIBRA-TABS) tablet 100 mg (100 mg Oral Given 11/14/20 1036)     ED Discharge Orders          Ordered    doxycycline (VIBRAMYCIN) 100 MG capsule  2 times daily        11/14/20 1029    divalproex (DEPAKOTE) 500 MG DR tablet  2 times daily        11/14/20 1029    docusate sodium (COLACE) 100 MG capsule  2 times daily        11/14/20 1029    enalapril (VASOTEC) 10 MG tablet  Daily        11/14/20 1029    FLUoxetine (PROZAC) 40 MG capsule  Daily        11/14/20 1029    SEROQUEL XR 400 MG 24 hr tablet  Daily at bedtime        11/14/20 1029    pantoprazole (PROTONIX) 40 MG tablet  Daily        11/14/20 1029    oxyCODONE-acetaminophen (PERCOCET) 5-325 MG tablet  Every 8 hours PRN        11/14/20 1029             Note:  This document was prepared using Dragon voice recognition software and may include unintentional dictation errors.    Lucrezia Starch, MD 11/14/20 1047

## 2020-11-18 LAB — BODY FLUID CULTURE W GRAM STAIN
Culture: NO GROWTH
Gram Stain: NONE SEEN

## 2020-11-19 LAB — CULTURE, BLOOD (ROUTINE X 2)
Culture: NO GROWTH
Special Requests: ADEQUATE

## 2021-01-17 ENCOUNTER — Other Ambulatory Visit: Payer: Self-pay

## 2021-01-17 ENCOUNTER — Emergency Department: Payer: Medicaid Other

## 2021-01-17 DIAGNOSIS — S81012A Laceration without foreign body, left knee, initial encounter: Secondary | ICD-10-CM | POA: Insufficient documentation

## 2021-01-17 DIAGNOSIS — R45851 Suicidal ideations: Secondary | ICD-10-CM | POA: Insufficient documentation

## 2021-01-17 DIAGNOSIS — F10129 Alcohol abuse with intoxication, unspecified: Secondary | ICD-10-CM | POA: Insufficient documentation

## 2021-01-17 DIAGNOSIS — F319 Bipolar disorder, unspecified: Secondary | ICD-10-CM | POA: Insufficient documentation

## 2021-01-17 DIAGNOSIS — X838XXA Intentional self-harm by other specified means, initial encounter: Secondary | ICD-10-CM | POA: Insufficient documentation

## 2021-01-17 LAB — CBC
HCT: 40 % (ref 39.0–52.0)
Hemoglobin: 13.4 g/dL (ref 13.0–17.0)
MCH: 30 pg (ref 26.0–34.0)
MCHC: 33.5 g/dL (ref 30.0–36.0)
MCV: 89.5 fL (ref 80.0–100.0)
Platelets: 235 10*3/uL (ref 150–400)
RBC: 4.47 MIL/uL (ref 4.22–5.81)
RDW: 16.4 % — ABNORMAL HIGH (ref 11.5–15.5)
WBC: 5.4 10*3/uL (ref 4.0–10.5)
nRBC: 0 % (ref 0.0–0.2)

## 2021-01-17 LAB — COMPREHENSIVE METABOLIC PANEL
ALT: 16 U/L (ref 0–44)
AST: 18 U/L (ref 15–41)
Albumin: 4 g/dL (ref 3.5–5.0)
Alkaline Phosphatase: 71 U/L (ref 38–126)
Anion gap: 10 (ref 5–15)
BUN: 8 mg/dL (ref 6–20)
CO2: 21 mmol/L — ABNORMAL LOW (ref 22–32)
Calcium: 9.1 mg/dL (ref 8.9–10.3)
Chloride: 103 mmol/L (ref 98–111)
Creatinine, Ser: 0.88 mg/dL (ref 0.61–1.24)
GFR, Estimated: >60 mL/min (ref >60)
Glucose, Bld: 88 mg/dL (ref 70–99)
Potassium: 3.9 mmol/L (ref 3.5–5.1)
Sodium: 134 mmol/L — ABNORMAL LOW (ref 135–145)
Total Bilirubin: 0.4 mg/dL (ref 0.3–1.2)
Total Protein: 7.6 g/dL (ref 6.5–8.1)

## 2021-01-17 LAB — ACETAMINOPHEN LEVEL: Acetaminophen (Tylenol), Serum: <10 ug/mL — ABNORMAL LOW (ref 10–30)

## 2021-01-17 LAB — ETHANOL: Alcohol, Ethyl (B): 121 mg/dL — ABNORMAL HIGH (ref <10)

## 2021-01-17 LAB — SALICYLATE LEVEL: Salicylate Lvl: <7 mg/dL — ABNORMAL LOW (ref 7.0–30.0)

## 2021-01-17 NOTE — ED Notes (Addendum)
Red hoodie, brown coat, gloves, phone, boots, white socks, black pants, leather belt, brown leather wallet, blue sweatshirt placed in belongings bags, labelled, and placed in belongings locker. Pt wearing his own underwear* Backpack labeled, not opened*  Glasses with pt*

## 2021-01-17 NOTE — ED Triage Notes (Signed)
Pt states "I'm tired of living," states he has a history of cutting and hit his left knee with a hammer in the past. Pt denies a triggering event for the SI. Pt denies HI. Pt is AOX4, NAD noted. Pt is ambulatory. Pt states he has history of binge drinking, states he had five drinks tonight, and reports weed. Pt states he takes his medication as ordered.

## 2021-01-17 NOTE — ED Notes (Signed)
Pt taken to xray 

## 2021-01-17 NOTE — ED Notes (Signed)
Pt to be brought back to triage middle, security notified.

## 2021-01-17 NOTE — ED Notes (Signed)
Left knee with dried blood and swelling noted. Pt states he's been beating and cutting it.

## 2021-01-18 ENCOUNTER — Encounter: Payer: Self-pay | Admitting: Psychiatry

## 2021-01-18 ENCOUNTER — Inpatient Hospital Stay
Admission: RE | Admit: 2021-01-18 | Discharge: 2021-01-28 | DRG: 885 | Disposition: A | Discharge status: Home / Self Care | Payer: Medicaid Other | Source: Intra-hospital | Attending: Psychiatry | Admitting: Psychiatry

## 2021-01-18 ENCOUNTER — Emergency Department (EMERGENCY_DEPARTMENT_HOSPITAL)
Admission: EM | Admit: 2021-01-18 | Discharge: 2021-01-18 | Disposition: A | Discharge status: Other Acute Inpatient Hospital | Payer: Medicaid Other | Source: Home / Self Care | Attending: Emergency Medicine | Admitting: Emergency Medicine

## 2021-01-18 ENCOUNTER — Other Ambulatory Visit: Payer: Self-pay

## 2021-01-18 DIAGNOSIS — F1010 Alcohol abuse, uncomplicated: Secondary | ICD-10-CM | POA: Diagnosis present

## 2021-01-18 DIAGNOSIS — R45851 Suicidal ideations: Secondary | ICD-10-CM

## 2021-01-18 DIAGNOSIS — Z7982 Long term (current) use of aspirin: Secondary | ICD-10-CM

## 2021-01-18 DIAGNOSIS — F319 Bipolar disorder, unspecified: Secondary | ICD-10-CM | POA: Diagnosis present

## 2021-01-18 DIAGNOSIS — I10 Essential (primary) hypertension: Secondary | ICD-10-CM | POA: Diagnosis present

## 2021-01-18 DIAGNOSIS — Z87891 Personal history of nicotine dependence: Secondary | ICD-10-CM | POA: Diagnosis not present

## 2021-01-18 DIAGNOSIS — K219 Gastro-esophageal reflux disease without esophagitis: Secondary | ICD-10-CM | POA: Diagnosis present

## 2021-01-18 DIAGNOSIS — F332 Major depressive disorder, recurrent severe without psychotic features: Secondary | ICD-10-CM | POA: Diagnosis present

## 2021-01-18 DIAGNOSIS — Z9152 Personal history of nonsuicidal self-harm: Secondary | ICD-10-CM

## 2021-01-18 DIAGNOSIS — Z7289 Other problems related to lifestyle: Secondary | ICD-10-CM

## 2021-01-18 DIAGNOSIS — Z818 Family history of other mental and behavioral disorders: Secondary | ICD-10-CM | POA: Diagnosis not present

## 2021-01-18 DIAGNOSIS — R4588 Nonsuicidal self-harm: Secondary | ICD-10-CM

## 2021-01-18 DIAGNOSIS — Z5900 Homelessness unspecified: Secondary | ICD-10-CM

## 2021-01-18 DIAGNOSIS — G4700 Insomnia, unspecified: Secondary | ICD-10-CM | POA: Diagnosis present

## 2021-01-18 DIAGNOSIS — G2581 Restless legs syndrome: Secondary | ICD-10-CM | POA: Diagnosis present

## 2021-01-18 DIAGNOSIS — F3130 Bipolar disorder, current episode depressed, mild or moderate severity, unspecified: Secondary | ICD-10-CM | POA: Diagnosis present

## 2021-01-18 DIAGNOSIS — Z23 Encounter for immunization: Secondary | ICD-10-CM | POA: Diagnosis not present

## 2021-01-18 DIAGNOSIS — F10929 Alcohol use, unspecified with intoxication, unspecified: Secondary | ICD-10-CM

## 2021-01-18 LAB — URINE DRUG SCREEN, QUALITATIVE (ARMC ONLY)
Amphetamines, Ur Screen: NOT DETECTED
Barbiturates, Ur Screen: NOT DETECTED
Benzodiazepine, Ur Scrn: NOT DETECTED
Cannabinoid 50 Ng, Ur ~~LOC~~: POSITIVE — AB
Cocaine Metabolite,Ur ~~LOC~~: NOT DETECTED
MDMA (Ecstasy)Ur Screen: NOT DETECTED
Methadone Scn, Ur: NOT DETECTED
Opiate, Ur Screen: NOT DETECTED
Phencyclidine (PCP) Ur S: NOT DETECTED
Tricyclic, Ur Screen: NOT DETECTED

## 2021-01-18 LAB — LIPID PANEL
Cholesterol: 202 mg/dL — ABNORMAL HIGH (ref 0–200)
HDL: 40 mg/dL — ABNORMAL LOW (ref >40)
LDL Cholesterol: 128 mg/dL — ABNORMAL HIGH (ref 0–99)
Total CHOL/HDL Ratio: 5.1 RATIO
Triglycerides: 172 mg/dL — ABNORMAL HIGH (ref <150)
VLDL: 34 mg/dL (ref 0–40)

## 2021-01-18 LAB — HEMOGLOBIN A1C
Hgb A1c MFr Bld: 5.3 % (ref 4.8–5.6)
Mean Plasma Glucose: 105.41 mg/dL

## 2021-01-18 MED ORDER — ENALAPRIL MALEATE 10 MG PO TABS
10.0000 mg | ORAL_TABLET | Freq: Every day | ORAL | Status: DC
  Administered 2021-01-18: 10 mg via ORAL
  Filled 2021-01-18: qty 1

## 2021-01-18 MED ORDER — MAGNESIUM HYDROXIDE 400 MG/5ML PO SUSP
30.0000 mL | Freq: Every day | ORAL | Status: DC | PRN
  Administered 2021-01-22: 30 mL via ORAL
  Filled 2021-01-18: qty 30

## 2021-01-18 MED ORDER — DOCUSATE SODIUM 100 MG PO CAPS: 100.0000 mg | ORAL_CAPSULE | Freq: Two times a day (BID) | ORAL | Status: DC

## 2021-01-18 MED ORDER — ONDANSETRON 4 MG PO TBDP
ORAL_TABLET | ORAL | Status: AC
  Filled 2021-01-18: qty 1

## 2021-01-18 MED ORDER — ADULT MULTIVITAMIN W/MINERALS CH
1.0000 | ORAL_TABLET | Freq: Every day | ORAL | Status: DC
  Administered 2021-01-18: 1 via ORAL
  Filled 2021-01-18: qty 1

## 2021-01-18 MED ORDER — QUETIAPINE FUMARATE ER 300 MG PO TB24
600.0000 mg | ORAL_TABLET | Freq: Every day | ORAL | Status: DC
  Administered 2021-01-18 – 2021-01-27 (×10): 600 mg via ORAL
  Filled 2021-01-18 (×11): qty 2

## 2021-01-18 MED ORDER — HYDROXYZINE HCL 50 MG PO TABS
50.0000 mg | ORAL_TABLET | Freq: Three times a day (TID) | ORAL | Status: DC | PRN
  Administered 2021-01-18 – 2021-01-28 (×24): 50 mg via ORAL
  Filled 2021-01-18 (×25): qty 1

## 2021-01-18 MED ORDER — DIVALPROEX SODIUM 500 MG PO DR TAB
500.0000 mg | DELAYED_RELEASE_TABLET | Freq: Two times a day (BID) | ORAL | Status: DC
  Administered 2021-01-18 – 2021-01-20 (×4): 500 mg via ORAL
  Filled 2021-01-18 (×4): qty 1

## 2021-01-18 MED ORDER — GABAPENTIN 400 MG PO CAPS
400.0000 mg | ORAL_CAPSULE | Freq: Three times a day (TID) | ORAL | Status: DC
  Administered 2021-01-18 – 2021-01-28 (×30): 400 mg via ORAL
  Filled 2021-01-18 (×29): qty 1

## 2021-01-18 MED ORDER — ONDANSETRON 4 MG PO TBDP
4.0000 mg | ORAL_TABLET | Freq: Once | ORAL | Status: AC | PRN
  Administered 2021-01-18: 4 mg via ORAL

## 2021-01-18 MED ORDER — ASPIRIN EC 325 MG PO TBEC
325.0000 mg | DELAYED_RELEASE_TABLET | Freq: Every day | ORAL | Status: DC
  Administered 2021-01-18: 325 mg via ORAL
  Filled 2021-01-18: qty 1

## 2021-01-18 MED ORDER — QUETIAPINE FUMARATE ER 200 MG PO TB24
400.0000 mg | ORAL_TABLET | Freq: Every day | ORAL | Status: DC
  Filled 2021-01-18: qty 2

## 2021-01-18 MED ORDER — ALUM & MAG HYDROXIDE-SIMETH 200-200-20 MG/5ML PO SUSP: 30.0000 mL | ORAL | Status: DC | PRN

## 2021-01-18 MED ORDER — DIVALPROEX SODIUM 500 MG PO DR TAB
500.0000 mg | DELAYED_RELEASE_TABLET | Freq: Two times a day (BID) | ORAL | Status: DC
  Administered 2021-01-18: 500 mg via ORAL
  Filled 2021-01-18: qty 1

## 2021-01-18 MED ORDER — ENALAPRIL MALEATE 10 MG PO TABS
10.0000 mg | ORAL_TABLET | Freq: Every day | ORAL | Status: DC
  Administered 2021-01-19 – 2021-01-28 (×10): 10 mg via ORAL
  Filled 2021-01-18 (×10): qty 1

## 2021-01-18 MED ORDER — ADULT MULTIVITAMIN W/MINERALS CH
1.0000 | ORAL_TABLET | Freq: Every day | ORAL | Status: DC
  Administered 2021-01-19 – 2021-01-28 (×10): 1 via ORAL
  Filled 2021-01-18 (×10): qty 1

## 2021-01-18 MED ORDER — ACETAMINOPHEN 325 MG PO TABS
650.0000 mg | ORAL_TABLET | Freq: Four times a day (QID) | ORAL | Status: DC | PRN
  Administered 2021-01-18 – 2021-01-21 (×2): 650 mg via ORAL
  Filled 2021-01-18 (×2): qty 2

## 2021-01-18 MED ORDER — PANTOPRAZOLE SODIUM 40 MG PO TBEC
40.0000 mg | DELAYED_RELEASE_TABLET | Freq: Every day | ORAL | Status: DC
  Administered 2021-01-19 – 2021-01-28 (×10): 40 mg via ORAL
  Filled 2021-01-18 (×10): qty 1

## 2021-01-18 MED ORDER — GABAPENTIN 300 MG PO CAPS
400.0000 mg | ORAL_CAPSULE | Freq: Three times a day (TID) | ORAL | Status: DC
  Administered 2021-01-18: 400 mg via ORAL
  Filled 2021-01-18: qty 1

## 2021-01-18 MED ORDER — FLUOXETINE HCL 20 MG PO CAPS
80.0000 mg | ORAL_CAPSULE | Freq: Every day | ORAL | Status: DC
  Administered 2021-01-19 – 2021-01-28 (×10): 80 mg via ORAL
  Filled 2021-01-18 (×11): qty 4

## 2021-01-18 MED ORDER — INFLUENZA VAC SPLIT QUAD 0.5 ML IM SUSY
0.5000 mL | PREFILLED_SYRINGE | INTRAMUSCULAR | Status: AC
  Administered 2021-01-19: 0.5 mL via INTRAMUSCULAR
  Filled 2021-01-18 (×2): qty 0.5

## 2021-01-18 MED ORDER — PANTOPRAZOLE SODIUM 40 MG PO TBEC
40.0000 mg | DELAYED_RELEASE_TABLET | Freq: Every day | ORAL | Status: DC
  Administered 2021-01-18: 40 mg via ORAL
  Filled 2021-01-18: qty 1

## 2021-01-18 MED ORDER — DOCUSATE SODIUM 100 MG PO CAPS
100.0000 mg | ORAL_CAPSULE | Freq: Two times a day (BID) | ORAL | Status: DC
  Administered 2021-01-19 – 2021-01-28 (×18): 100 mg via ORAL
  Filled 2021-01-18 (×19): qty 1

## 2021-01-18 MED ORDER — FLUOXETINE HCL 20 MG PO CAPS
80.0000 mg | ORAL_CAPSULE | Freq: Every day | ORAL | Status: DC
  Administered 2021-01-18: 80 mg via ORAL
  Filled 2021-01-18: qty 4

## 2021-01-18 MED ORDER — ACETAMINOPHEN 500 MG PO TABS
1000.0000 mg | ORAL_TABLET | Freq: Once | ORAL | Status: AC
  Administered 2021-01-18: 1000 mg via ORAL
  Filled 2021-01-18: qty 2

## 2021-01-18 MED ORDER — ASPIRIN EC 325 MG PO TBEC
325.0000 mg | DELAYED_RELEASE_TABLET | Freq: Every day | ORAL | Status: DC
  Administered 2021-01-19 – 2021-01-26 (×8): 325 mg via ORAL
  Filled 2021-01-18 (×13): qty 1

## 2021-01-18 MED ORDER — HYDROXYZINE HCL 25 MG PO TABS
50.0000 mg | ORAL_TABLET | Freq: Three times a day (TID) | ORAL | Status: DC | PRN
  Administered 2021-01-18: 50 mg via ORAL
  Filled 2021-01-18: qty 2

## 2021-01-18 MED ORDER — NALTREXONE HCL 50 MG PO TABS
50.0000 mg | ORAL_TABLET | Freq: Every day | ORAL | Status: DC
  Administered 2021-01-18 – 2021-01-28 (×11): 50 mg via ORAL
  Filled 2021-01-18 (×11): qty 1

## 2021-01-18 NOTE — ED Notes (Signed)
Pt requesting medication at this time for a headache, also states he would like his medications from home. This RN asked pharmacy tech to do med rec at this time. Dr. Sidney Ace notified.

## 2021-01-18 NOTE — Consult Note (Signed)
[facility] Face-to-Face Psychiatry Consult   Reason for Consult:  SI   Referring Physician:  EDP Patient Identification: [name] MRN:  [id number] Principal Diagnosis: Bipolar depression ([place]) Diagnosis:  Principal Problem:   Bipolar depression ([place])   Total Time spent with patient: 45 minutes  Subjective:   [name] Goodie is a 61 y.o. male patient admitted with suicidal thoughts.   HPI:  Patient seen face-to-face and chart reviewed.  Patient reports that he had been living with his longtime girlfriend (28 years) who has dementia.  She went into assisted living in May 2022 and the state took her house.  Patient has been living with a friend since then, but that situation has ended.  Patient states for the last several months since his girlfriend was put into assisted living he has become more suicidal and harming himself.  He states that he will cut his knee and hit it with a hammer.  He states "the more blood the better I feel."  He endorses poor eating and sleeping habits.  He states that he is paranoid, feeling like somebody is always after him.  He endorses auditory hallucinations telling him to hurt himself.  He denies homicidal ideations.  Patient states that he gets care at [facility], but his medications are not helping him.  Patient reports having some grown children in [place] but he does not have a good relationship with them.  He states that the last time he was hospitalized was 5 or 6 years ago in [place].  Patient is unemployed, on disability.  Patient reports no suicide attempts.  Recommend psychiatric hospitalization.  Past Psychiatric History: bipolar depression. Patient reports several hospitalizations.   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 echo 5/17: EF 60-65%, no RWMA, LV dias fxn nl, LA mildly dilated, RV sys fxn nl, PASP nl, moderate sized circumferential pericardial effusion was identified, 2.12 cm around the LV free wall, <1 cm around the RV free wall. Features were not c/w tamponade physiology   PTSD (post-traumatic stress disorder)    Witnessed brother's suicide.   Restless leg syndrome    Syncope     Past Surgical History:  Procedure Laterality Date   CYSTOSCOPY WITH URETEROSCOPY AND STENT PLACEMENT     ESOPHAGOGASTRODUODENOSCOPY N/A 01/11/2016   Procedure: ESOPHAGOGASTRODUODENOSCOPY (EGD);  Surgeon: Wilford Corner, MD;  Location: [facility] ENDOSCOPY;  Service: Endoscopy;  Laterality: N/A;   ESOPHAGOGASTRODUODENOSCOPY N/A 04/09/2020   Procedure: ESOPHAGOGASTRODUODENOSCOPY (EGD);  Surgeon: Jonathon Bellows, MD;  Location: [facility] ENDOSCOPY;  Service: Gastroenterology;  Laterality: N/A;   INCISION AND DRAINAGE ABSCESS Left 01/02/2018   Procedure: INCISION AND DRAINAGE LEFT KNEE;  Surgeon: Earnestine Leys, MD;  Location: [facility] ORS;  Service: Orthopedics;  Laterality: Left;   JOINT REPLACEMENT Right    TKR   KNEE ARTHROSCOPY Right 06/25/2014   Procedure: ARTHROSCOPY KNEE;  Surgeon: Earnestine Leys, MD;  Location: [facility] ORS;  Service: Orthopedics;  Laterality: Right;  partial arthroscopic medial  menisectomy   TOTAL KNEE ARTHROPLASTY Right 04/22/2015   Procedure: TOTAL KNEE ARTHROPLASTY;  Surgeon: Earnestine Leys, MD;  Location: [facility] ORS;  Service: Orthopedics;  Laterality: Right;   TOTAL KNEE ARTHROPLASTY Left 10/30/2017   Procedure: TOTAL KNEE ARTHROPLASTY;  Surgeon: Earnestine Leys, MD;  Location: [facility] ORS;  Service: Orthopedics;  Laterality: Left;   TOTAL KNEE REVISION Left 01/02/2018   Procedure: poly exchange of tibia and patella left knee;  Surgeon: Earnestine Leys, MD;  Location: [facility] ORS;  Service: Orthopedics;  Laterality: Left;   Family History:  Family History  Problem Relation Age of Onset   CVA Mother        deceased at age 9   Depression Brother        Died by suicide at age 59   Family Psychiatric  History: Unknown Social History:  Social History   Substance and Sexual Activity  Alcohol Use Yes   Comment: occ     Social History   Substance and Sexual Activity  Drug Use Yes   Frequency: 5.0 times per week   Types: Marijuana   Comment: marijuana    Social History   Socioeconomic History   Marital status: Single    Spouse name: Not on file   Number of children: Not on file   Years of education: Not on file   Highest education level: Not on file  Occupational History   Not on file  Tobacco Use   Smoking status: Former    Packs/day: 0.75    Years: 20.00    Pack years: 15.00    Types: Cigarettes    Quit date: 05/16/1984    Years since quitting: 36.7   Smokeless tobacco: Never  Vaping Use   Vaping Use: Not on file  Substance and Sexual Activity   Alcohol use: Yes    Comment: occ   Drug use: Yes    Frequency: 5.0 times per week    Types: Marijuana    Comment: marijuana   Sexual activity: Not on file  Other Topics Concern   Not on file  Social History Narrative   Not on file   Social Determinants of Health   Financial Resource Strain: Not on file  Food Insecurity: Not on file  Transportation Needs: Not on file  Physical Activity: Not on file  Stress: Not on file  Social Connections: Not on file   Additional Social History:    Allergies:   Allergies  Allergen Reactions   Toradol [Ketorolac Tromethamine] Hives    Labs:  Results for orders placed or performed during the hospital encounter of 01/18/21 (from the past 48 hour(s))  Comprehensive metabolic panel     Status: Abnormal   Collection Time: 01/17/21 10:46 PM  Result Value Ref Range   Sodium 134 (L) 135 - 145 mmol/L   Potassium 3.9 3.5  - 5.1 mmol/L   Chloride 103 98 - 111 mmol/L   CO2 21 (L) 22 - 32 mmol/L   Glucose, Bld 88 70 - 99 mg/dL    Comment: Glucose reference range applies only to samples taken after fasting for at least 8 hours.   BUN 8 6 - 20 mg/dL   Creatinine, Ser 0.88 0.61 - 1.24 mg/dL   Calcium 9.1 8.9 - 10.3 mg/dL   Total Protein 7.6 6.5 - 8.1 g/dL   Albumin 4.0 3.5 - 5.0 g/dL   AST 18 15 - 41 U/L   ALT 16 0 - 44 U/L   Alkaline Phosphatase  71 38 - 126 U/L   Total Bilirubin 0.4 0.3 - 1.2 mg/dL   GFR, Estimated >60 >60 mL/min    Comment: (NOTE) Calculated using the CKD-EPI Creatinine Equation (2021)    Anion gap 10 5 - 15    Comment: Performed at [facility], [address] [place], [place] [address]  Ethanol     Status: Abnormal   Collection Time: 01/17/21 10:46 PM  Result Value Ref Range   Alcohol, Ethyl (B) 121 (H) <10 mg/dL    Comment: (NOTE) Lowest detectable limit for serum alcohol is 10 mg/dL.  For medical purposes only. Performed at [facility], [address] [place], [place] [id number]   Salicylate level     Status: Abnormal   Collection Time: 01/17/21 10:46 PM  Result Value Ref Range   Salicylate Lvl [id number] (L) 7.0 - 30.0 mg/dL    Comment: Performed at [facility], [address] [place], [place] [address]  Acetaminophen level     Status: Abnormal   Collection Time: 01/17/21 10:46 PM  Result Value Ref Range   Acetaminophen (Tylenol), Serum <10 (L) 10 - 30 ug/mL    Comment: (NOTE) Therapeutic concentrations vary significantly. A range of 10-30 ug/mL  may be an effective concentration for many patients. However, some  are best treated at concentrations outside of this range. Acetaminophen concentrations >150 ug/mL at 4 hours after ingestion  and >50 ug/mL at 12 hours after ingestion are often associated with  toxic reactions.  Performed at [facility], [address] [place], [place] [address]   cbc     Status: Abnormal    Collection Time: 01/17/21 10:46 PM  Result Value Ref Range   WBC 5.4 4.0 - 10.5 K/uL   RBC 4.47 4.22 - 5.81 MIL/uL   Hemoglobin 13.4 13.0 - 17.0 g/dL   HCT 40.0 39.0 - 52.0 %   MCV 89.5 80.0 - 100.0 fL   MCH 30.0 26.0 - 34.0 pg   MCHC 33.5 30.0 - 36.0 g/dL   RDW 16.4 (H) 11.5 - 15.5 %   Platelets 235 150 - 400 K/uL   nRBC 0.0 0.0 - 0.2 %    Comment: Performed at [facility], [address] [place], [place] [address]  Urine Drug Screen, Qualitative     Status: Abnormal   Collection Time: 01/17/21 10:46 PM  Result Value Ref Range   Tricyclic, Ur Screen NONE DETECTED NONE DETECTED   Amphetamines, Ur Screen NONE DETECTED NONE DETECTED   MDMA (Ecstasy)Ur Screen NONE DETECTED NONE DETECTED   Cocaine Metabolite,Ur [place] NONE DETECTED NONE DETECTED   Opiate, Ur Screen NONE DETECTED NONE DETECTED   Phencyclidine (PCP) Ur S NONE DETECTED NONE DETECTED   Cannabinoid 50 Ng, Ur [place] POSITIVE (A) NONE DETECTED   Barbiturates, Ur Screen NONE DETECTED NONE DETECTED   Benzodiazepine, Ur Scrn NONE DETECTED NONE DETECTED   Methadone Scn, Ur NONE DETECTED NONE DETECTED    Comment: (NOTE) Tricyclics + metabolites, urine    Cutoff 1000 ng/mL Amphetamines + metabolites, urine  Cutoff 1000 ng/mL MDMA (Ecstasy), urine              Cutoff 500 ng/mL Cocaine Metabolite, urine          Cutoff 300 ng/mL Opiate + metabolites, urine        Cutoff 300 ng/mL Phencyclidine (PCP), urine         Cutoff 25 ng/mL Cannabinoid, urine  Cutoff 50 ng/mL Barbiturates + metabolites, urine  Cutoff 200 ng/mL Benzodiazepine, urine              Cutoff 200 ng/mL Methadone, urine                   Cutoff 300 ng/mL  The urine drug screen provides only a preliminary, unconfirmed analytical test result and should not be used for non-medical purposes. Clinical consideration and professional judgment should be applied to any positive drug screen result due to possible interfering substances. A more specific  alternate chemical method must be used in order to obtain a confirmed analytical result. Gas chromatography / mass spectrometry (GC/MS) is the preferred confirm atory method. Performed at [facility], [address] [place], [place] [address]     Current Facility-Administered Medications  Medication Dose Route Frequency Provider Last Rate Last Admin   aspirin EC tablet 325 mg  325 mg Oral Daily Rada Hay, MD   325 mg at 01/18/21 1150   divalproex (DEPAKOTE) DR tablet 500 mg  500 mg Oral BID Rada Hay, MD   500 mg at 01/18/21 1150   docusate sodium (COLACE) capsule 100 mg  100 mg Oral BID Rada Hay, MD       enalapril (VASOTEC) tablet 10 mg  10 mg Oral Daily Rada Hay, MD   10 mg at 01/18/21 1150   FLUoxetine (PROZAC) capsule 80 mg  80 mg Oral Daily Rada Hay, MD   80 mg at 01/18/21 1150   gabapentin (NEURONTIN) capsule 400 mg  400 mg Oral TID Rada Hay, MD   400 mg at 01/18/21 1150   multivitamin with minerals tablet 1 tablet  1 tablet Oral Daily Rada Hay, MD   1 tablet at 01/18/21 1150   pantoprazole (PROTONIX) EC tablet 40 mg  40 mg Oral Daily Rada Hay, MD   40 mg at 01/18/21 1150   QUEtiapine (SEROQUEL XR) 24 hr tablet 400 mg  400 mg Oral QHS Rada Hay, MD       Current Outpatient Medications  Medication Sig Dispense Refill   aspirin EC 325 MG tablet Take 1 tablet (325 mg total) by mouth daily. 30 tablet 0   divalproex (DEPAKOTE) 500 MG DR tablet Take 1 tablet (500 mg total) by mouth 2 (two) times daily. 60 tablet 0   docusate sodium (COLACE) 100 MG capsule Take 1 capsule (100 mg total) by mouth 2 (two) times daily. 30 capsule 0   enalapril (VASOTEC) 10 MG tablet Take 1 tablet (10 mg total) by mouth daily. 30 tablet 0   FLUoxetine (PROZAC) 40 MG capsule Take 2 capsules (80 mg total) by mouth daily. 60 capsule 0   gabapentin (NEURONTIN) 400 MG capsule Take 400 mg by mouth 3 (three) times  daily.     Multiple Vitamin (MULTIVITAMIN WITH MINERALS) TABS tablet Take 1 tablet by mouth daily.     pantoprazole (PROTONIX) 40 MG tablet Take 1 tablet (40 mg total) by mouth daily. 30 tablet 2   QUEtiapine (SEROQUEL) 50 MG tablet Take 50 mg by mouth 4 (four) times daily as needed (for agitation).      SEROQUEL XR 400 MG 24 hr tablet Take 1 tablet (400 mg total) by mouth at bedtime. 30 tablet 0   benztropine (COGENTIN) 1 MG tablet Take 1 mg by mouth 2 (two) times daily. (Patient not taking: Reported on 01/18/2021)     diphenoxylate-atropine (LOMOTIL)  2.5-0.025 MG tablet Take 2 tablets by mouth 4 (four) times daily as needed. (Patient not taking: Reported on 01/18/2021)     divalproex (DEPAKOTE ER) 500 MG 24 hr tablet Take 500 mg by mouth 2 (two) times daily. (Patient not taking: Reported on 01/18/2021)     oxyCODONE (ROXICODONE) 5 MG immediate release tablet Take 1 tablet (5 mg total) by mouth every 8 (eight) hours as needed. (Patient not taking: Reported on 01/18/2021) 15 tablet 0   polyethylene glycol (MIRALAX / GLYCOLAX) 17 g packet Take 17 g by mouth daily as needed for mild constipation or moderate constipation. (Patient not taking: Reported on 02/01/2019) 14 each 0   traMADol (ULTRAM) 50 MG tablet Take 2 tablets (100 mg total) by mouth every 6 (six) hours. (Patient not taking: Reported on 04/07/2020) 25 tablet 0    Musculoskeletal: Strength & Muscle Tone: within normal limits Gait & Station:  Length, due to knee replacement and injury Patient leans: N/A  Psychiatric Specialty Exam:  Presentation  General Appearance: Appropriate for Environment Eye Contact:Fair Speech:No data recorded Speech Volume:Normal Handedness:No data recorded  Mood and Affect  Mood:Anxious; Depressed; Dysphoric Affect:Blunt  Thought Process  Thought Processes:Coherent Descriptions of Associations:Intact  Orientation:Full (Time, Place and Person)  Thought Content:WDL  History of Schizophrenia/Schizoaffective  disorder:No data recorded Duration of Psychotic Symptoms:No data recorded Hallucinations:Hallucinations: Auditory Description of Auditory Hallucinations: "To hurt myself"  Ideas of Reference:Paranoia  Suicidal Thoughts:Suicidal Thoughts: Yes, Passive SI Passive Intent and/or Plan: Without Intent  Homicidal Thoughts:Homicidal Thoughts: No   Sensorium  Memory:Immediate Good Judgment:Fair Insight:Fair  Executive Functions  Concentration:Fair Attention Span:Good Recall:Good Fund of Knowledge:Fair Language:Fair  Psychomotor Activity  Psychomotor Activity:Psychomotor Activity: Normal  Assets  Assets:Desire for Improvement; Resilience  Sleep  Sleep:Sleep: Poor  Physical Exam: Physical Exam Vitals and nursing note reviewed.  HENT:     Head: Normocephalic.     Nose: No congestion or rhinorrhea.  Eyes:     General:        Right eye: No discharge.        Left eye: No discharge.  Cardiovascular:     Rate and Rhythm: Normal rate.  Pulmonary:     Effort: Pulmonary effort is normal.  Musculoskeletal:        General: Normal range of motion.     Cervical back: Normal range of motion.  Skin:    General: Skin is dry.  Neurological:     Mental Status: He is alert and oriented to person, place, and time.  Psychiatric:        Attention and Perception: Attention normal.        Mood and Affect: Mood is depressed. Affect is blunt.        Speech: Speech normal.        Behavior: Behavior normal. Behavior is cooperative.        Thought Content: Thought content is paranoid. Thought content includes suicidal ideation.        Cognition and Memory: Cognition normal.        Judgment: Judgment is impulsive.   Review of Systems  Psychiatric/Behavioral:  Positive for depression and suicidal ideas.   All other systems reviewed and are negative. Blood pressure (!) 145/85, pulse 88, temperature 97.8 F (36.6 C), resp. rate 18, SpO2 97 %. There is no height or weight on file to calculate  BMI.  Treatment Plan Summary: Daily contact with patient to assess and evaluate symptoms and progress in treatment, Medication management, and Plan : Recommend inpatient  psychiatric hospitalization for stabilization and treatment  Disposition: Recommend psychiatric Inpatient admission when medically cleared.  Sherlon Handing, NP 01/18/2021 12:25 PM

## 2021-01-18 NOTE — Progress Notes (Signed)
Patient ID: [name], male   DOB: 11-Mar-1960, 61 y.o.   MRN: [id number] Presents voluntarily endorsing suicidal thoughts with recent self-cut on left knee. Reports that he has been experiencing stressful events including losing housing. Currently homeless. Was living with his girlfriend who is now at an assisted living due to dementia. He reports that he has no support: daughter does not want to hear from him. Patient reports that he is currently homeless, which triggers his self-harm thoughts and behaviors. He reports hx of Bipolar, PTSD and GAD. Pt states "I have nothing, I have no life, and that's why I feel like dying is the right thing to do". Upon skin assessment, it was noted that pt has fresh superficial cuts on left knee and reported that he recently has impulsive thoughts and started cutting. He contracts for safety and reports that he wants to feel better and improve his life. Pt was oriented to the unit and safety precautions initiated.

## 2021-01-18 NOTE — Group Note (Signed)
[facility] LCSW Group Therapy Note ° ° ° °Group Date: 01/18/2021 °Start Time: 1300 °End Time: 1340 ° °Type of Therapy and Topic:  Group Therapy:  Overcoming Obstacles ° °Participation Level:  [facility] PARTICIPATION LEVEL: Did Not Attend ° °Mood: ° °Description of Group:   °In this group patients will be encouraged to explore what they see as obstacles to their own wellness and recovery. They will be guided to discuss their thoughts, feelings, and behaviors related to these obstacles. The group will process together ways to cope with barriers, with attention given to specific choices patients can make. Each patient will be challenged to identify changes they are motivated to make in order to overcome their obstacles. This group will be process-oriented, with patients participating in exploration of their own experiences as well as giving and receiving support and challenge from other group members. ° °Therapeutic Goals: °1. Patient will identify personal and current obstacles as they relate to admission. °2. Patient will identify barriers that currently interfere with their wellness or overcoming obstacles.  °3. Patient will identify feelings, thought process and behaviors related to these barriers. °4. Patient will identify two changes they are willing to make to overcome these obstacles:  ° ° °Summary of Patient Progress °X ° °Therapeutic Modalities:   °Cognitive Behavioral Therapy °Solution Focused Therapy °Motivational Interviewing °Relapse Prevention Therapy ° ° °Laine Giovanetti R Lyah Millirons, LCSW °

## 2021-01-18 NOTE — Progress Notes (Signed)
Patient calm and pleasant during assessment. Pt endorses anxiety and depression stating to this writer that stems from him being homeless ever since his girlfriend was taken to a nursing home for her medical care. Patient observed interacting appropriately with staff and peers on the unit. Pt compliant with medication administration per MD orders. Pt given education, support, and encouragement to be active in his treatment plan. Pt being monitored Q 15 minutes for safety per unit protocol. Pt remains safe on the unit.

## 2021-01-18 NOTE — ED Notes (Signed)
VOL/pending psych consult 

## 2021-01-18 NOTE — ED Notes (Signed)
Pt has been sitting in wheelchair in constant view of security or nursing staff since 2300 at lobby desk, waiting on bed placement. Pt appears in no acute distress and has been updated on wait.

## 2021-01-18 NOTE — Plan of Care (Signed)
Newly admitted and adjusting well. Continues to endorse passive SI and states "my life is miserable". Patient is getting along with staff and peers and contracts for safety.

## 2021-01-18 NOTE — BH Assessment (Signed)
Patient is to be admitted to [facility] BMU today 01/18/21 at 1:30pm by Dr. Toni Amend.  Attending Physician will be Dr.  Toni Amend .   Patient has been assigned to room 303, by [facility] Charge Nurse, Wendall Mola.    ER staff is aware of the admission: Glenda, ER Secretary   Dr. Sidney Ace, ER MD  Morrie Sheldon, Patient's Nurse  Sue Lush, Patient Access.

## 2021-01-18 NOTE — ED Notes (Signed)
VOL  

## 2021-01-18 NOTE — Tx Team (Signed)
Initial Treatment Plan 01/18/2021 3:50 PM [name] [id number]    PATIENT STRESSORS: Financial difficulties   Loss of relationship   Traumatic event     PATIENT STRENGTHS: Average or above average intelligence  Communication skills  General fund of knowledge  Motivation for treatment/growth    PATIENT IDENTIFIED PROBLEMS: Depressed  Suicidal  Anxiety  Housing               DISCHARGE CRITERIA:  Ability to meet basic life and health needs Adequate post-discharge living arrangements Motivation to continue treatment in a less acute level of care Safe-care adequate arrangements made  PRELIMINARY DISCHARGE PLAN: Attend aftercare/continuing care group Attend PHP/IOP Outpatient therapy Placement in alternative living arrangements  PATIENT/FAMILY INVOLVEMENT: This treatment plan has been presented to and reviewed with the patient, [name].  The patient has been given the opportunity to ask questions and make suggestions.  Olin Pia, RN 01/18/2021, 3:50 PM

## 2021-01-18 NOTE — ED Notes (Signed)
Patient reports having increased stressors, states sees someone at [facility] every 3 months and is taking his medications as prescribed but not helping and that he is tired of hurting himself and wants help.

## 2021-01-18 NOTE — BH Assessment (Signed)
Comprehensive Clinical Assessment (CCA) Note  01/18/2021 [facility] Colon Perezperez [id number]  Darshawn Stewart, 61 year old male who presents to [facility] ED voluntarily for treatment. Per triage note, Pt states "I'm tired of living," states he has a history of cutting and hit his left knee with a hammer in the past. Pt denies a triggering event for the SI. Pt denies HI. Pt is AOX4, NAD noted. Pt is ambulatory. Pt states he has history of binge drinking, states he had five drinks tonight, and reports weed. Pt states he takes his medication as ordered.      During TTS assessment pt presents alert and oriented x 4, restless but cooperative, and mood-congruent with affect. The pt does not appear to be responding to internal or external stimuli. Neither is the pt presenting with any delusional thinking. Pt verified the information provided to triage RN.   Pt identifies his main complaint to be that his girlfriend of 24 years was recently diagnosed with dementia. Patient reports her symptoms worsened and she was placed in ALF. Patient reports the home they shared was taken away and he was forced to live with friends. Patient states he is homeless and has no reason to live anymore. Patient states he is compliant with his medications and takes them as prescribed, yet he feels they are no longer working. Patient reports he has been doing well for several years and has not had any issues until now. Patient reports being paranoid, thinking people are coming after him. Patient states he is not eating or sleeping well, and he is now starting to cut himself and hit his knee with a hammer. The more I bleed, the better I feel. Patient reports having auditory hallucinations where the voices are telling him to harm himself. Pt reports INPT hx several years ago and he goes to [place] for OPT tx. Pt denies HI/VH. Patient reports he does not feel safe leaving the hospital.    Per Barbaraann Share, NP, pt is recommended for inpatient psychiatric  admission.   Chief Complaint:  Chief Complaint  Patient presents with   Suicidal    "I can't do it anymore. I don't want to live."    Visit Diagnosis: Bipolar depression    CCA Screening, Triage and Referral (STR)  Patient Reported Information How did you hear about [place]? Self  Referral name: No data recorded Referral phone number: No data recorded  Whom do you see for routine medical problems? No data recorded Practice/Facility Name: No data recorded Practice/Facility Phone Number: No data recorded Name of Contact: No data recorded Contact Number: No data recorded Contact Fax Number: No data recorded Prescriber Name: No data recorded Prescriber Address (if known): No data recorded  What Is the Reason for Your Visit/Call Today? Patient is suicidal and having thoughts of hurting himself because girlfriend of 74yrs has dementia and was placed in an ALF.  How Long Has This Been Causing You Problems? 1-6 months  What Do You Feel Would Help You the Most Today? Treatment for Depression or other mood problem; Housing Assistance; Medication(s); Stress Management; Social Support   Have You Recently Been in Any Inpatient Treatment (Hospital/Detox/Crisis Center/28-Day Program)? No data recorded Name/Location of Program/Hospital:No data recorded How Long Were You There? No data recorded When Were You Discharged? No data recorded  Have You Ever Received Services From [facility] Before? No data recorded Who Do You See at [facility]? No data recorded  Have You Recently Had Any Thoughts About Hurting Yourself? Yes  Are You Planning to Commit Suicide/Harm Yourself At This time? No   Have you Recently Had Thoughts About [place]? No  Explanation: No data recorded  Have You Used Any Alcohol or Drugs in the Past 24 Hours? Yes  How Long Ago Did You Use Drugs or Alcohol? No data recorded What Did You Use and How Much? Alcohol- unknown   Do You Currently Have a  Therapist/Psychiatrist? Yes  Name of Therapist/Psychiatrist: [place] Recently Discharged From Any Mud[place] or Programs? No  Explanation of Discharge From Practice/Program: No data recorded    CCA Screening Triage Referral Assessment Type of Contact: Face-to-Face  Is this Initial or Reassessment? No data recorded Date Telepsych consult ordered in [facility]:  No data recorded Time Telepsych consult ordered in [facility]:  No data recorded  Patient Reported Information Reviewed? No data recorded Patient Left Without Being Seen? No data recorded Reason for Not Completing Assessment: No data recorded  Collateral Involvement: None provided   Does Patient Have a [place]? No data recorded Name and Contact of Legal Guardian: No data recorded If Minor and Not Living with Parent(s), Who has Custody? n/a  Is CPS involved or ever been involved? Never  Is APS involved or ever been involved? Never   Patient Determined To Be At Risk for Harm To Self or Others Based on Review of Patient Reported Information or Presenting Complaint? Yes, for Self-Harm  Method: No data recorded Availability of Means: No data recorded Intent: No data recorded Notification Required: No data recorded Additional Information for Danger to Others Potential: No data recorded Additional Comments for Danger to Others Potential: No data recorded Are There Guns or Other Weapons in Your Home? No data recorded Types of Guns/Weapons: No data recorded Are These Weapons Safely Secured?                            No data recorded Who Could Verify You Are Able To Have These Secured: No data recorded Do You Have any Outstanding Charges, Pending Court Dates, Parole/Probation? No data recorded Contacted To Inform of Risk of Harm To Self or Others: No data recorded  Location of Assessment: [facility] ED   Does Patient Present under Involuntary Commitment? No  IVC Papers Initial File Date:  No data recorded  [place] of Residence: [place]   Patient Currently Receiving the Following Services: Medication Management; Individual Therapy   Determination of Need: Urgent (48 hours)   Options For Referral: Inpatient Hospitalization; Intensive Outpatient Therapy; Medication Management     CCA Biopsychosocial Intake/Chief Complaint:  No data recorded Current Symptoms/Problems: No data recorded  Patient Reported Schizophrenia/Schizoaffective Diagnosis in Past: No   Strengths: Patient able to verbalize and communicate wants and needs.  Preferences: No data recorded Abilities: No data recorded  Type of Services Patient Feels are Needed: No data recorded  Initial Clinical Notes/Concerns: No data recorded  Mental Health Symptoms Depression:   Change in energy/activity; Sleep (too much or little); Difficulty Concentrating; Increase/decrease in appetite   Duration of Depressive symptoms:  Greater than two weeks   Mania:   None   Anxiety:    Difficulty concentrating; Worrying   Psychosis:   Hallucinations   Duration of Psychotic symptoms:  Less than six months   Trauma:   None   Obsessions:   None   Compulsions:   None   Inattention:   None   Hyperactivity/Impulsivity:  None   Oppositional/Defiant Behaviors:   None   Emotional Irregularity:   Chronic feelings of emptiness; Potentially harmful impulsivity; Recurrent suicidal behaviors/gestures/threats   Other Mood/Personality Symptoms:  No data recorded   Mental Status Exam Appearance and self-care  Stature:   Tall   Weight:   Average weight   Clothing:   Casual   Grooming:   Normal   Cosmetic use:   None   Posture/gait:   Slumped   Motor activity:   Not Remarkable   Sensorium  Attention:   Normal   Concentration:   Anxiety interferes   Orientation:   X5   Recall/memory:   Normal   Affect and Mood  Affect:   Anxious; Depressed   Mood:   Anxious; Depressed;  Hopeless   Relating  Eye contact:   Normal   Facial expression:   Anxious; Depressed; Sad   Attitude toward examiner:   Cooperative   Thought and Language  Speech flow:  Clear and Coherent   Thought content:   Appropriate to Mood and Circumstances   Preoccupation:   Suicide   Hallucinations:   Auditory   Organization:  No data recorded  [employer] of Knowledge:   Average   Intelligence:   Average   Abstraction:   Normal   Judgement:   Impaired   Reality Testing:   Adequate   Insight:   Fair   Decision Making:   Impulsive   Social Functioning  Social Maturity:  No data recorded  Social Judgement:  No data recorded  Stress  Stressors:   Housing; Illness; Relationship; Transitions   Coping Ability:   Overwhelmed; Deficient supports   Skill Deficits:   Decision making   Supports:   Friends/Service system     Religion:    Leisure/Recreation:    Exercise/Diet: Exercise/Diet Do You Follow a Special Diet?: No Do You Have Any Trouble Sleeping?: Yes Explanation of Sleeping Difficulties: Patient reports he is not able to sleep.   CCA Employment/Education Employment/Work Situation: Employment / Work Technical sales engineer: On disability  Education: Education Is Patient Currently Attending School?: No   CCA Family/Childhood History Family and Relationship History: Family history Marital status: Single Does patient have children?: Yes How is patient's relationship with their children?: Patient reports he does not have a good relationship with his children who live in [place].  Childhood History:     Child/Adolescent Assessment:     CCA Substance Use Alcohol/Drug Use: Alcohol / Drug Use Pain Medications: See PTA Prescriptions: See PTA Over the Counter: See PTA History of alcohol / drug use?: Yes Longest period of sobriety (when/how long): Unknown Negative Consequences of Use: Financial Substance  #1 Name of Substance 1: Alcohol                       ASAM's:  Six Dimensions of Multidimensional Assessment  Dimension 1:  Acute Intoxication and/or Withdrawal Potential:      Dimension 2:  Biomedical Conditions and Complications:      Dimension 3:  Emotional, Behavioral, or Cognitive Conditions and Complications:     Dimension 4:  Readiness to Change:     Dimension 5:  Relapse, Continued use, or Continued Problem Potential:     Dimension 6:  Recovery/Living Environment:     ASAM Severity Score:    ASAM Recommended Level of Treatment:     Substance use Disorder (SUD)    Recommendations for Services/Supports/Treatments:    DSM5 Diagnoses: Patient  Active Problem List   Diagnosis Date Noted   Bipolar 1 disorder ([place]) 01/18/2021   Pneumothorax on right 01/21/2019   Accelerated hypertension    Bipolar depression ([place])    Coronary artery calcification    Impaired fasting glucose    Multiple closed fractures of ribs of right side    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   Sepsis ([place]) 12/31/2017   History of esophagogastroduodenoscopy (EGD) 01/12/2016   Esophagitis 01/12/2016   Hypokalemia 01/12/2016   Leukocytosis 01/12/2016   Nausea and vomiting 01/11/2016   Odynophagia 01/11/2016   Abdominal pain 01/06/2016   Anemia 06/02/2015   Chest pain with moderate risk for cardiac etiology    Coronary artery disease involving native coronary artery of native heart with angina pectoris ([place])    Hyperlipidemia    Pleuritic chest pain 06/01/2015   Pericarditis with effusion 05/13/2015   Chest pain at rest 05/12/2015   Dyspnea 05/12/2015   SIRS (systemic inflammatory response syndrome) ([place]) 05/12/2015   Total knee replacement status 04/22/2015    Patient Centered Plan: Patient is on the following Treatment Plan(s):  Depression   Referrals to Alternative Service(s): Referred to Alternative Service(s):   Place:   Date:   Time:    Referred to  Alternative Service(s):   Place:   Date:   Time:    Referred to Alternative Service(s):   Place:   Date:   Time:    Referred to Alternative Service(s):   Place:   Date:   Time:     Rosmery Duggin Glennon Mac, Counselor, LCAS-A

## 2021-01-18 NOTE — BHH Suicide Risk Assessment (Signed)
[facility] Admission Suicide Risk Assessment   Nursing information obtained from:  Patient Demographic factors:  Male, Unemployed, Caucasian, Divorced or widowed Current Mental Status:  Suicidal ideation indicated by patient Loss Factors:  Loss of significant relationship, Financial problems / change in socioeconomic status Historical Factors:  Impulsivity Risk Reduction Factors:  NA  Total Time spent with patient: 1 hour Principal Problem: Severe recurrent major depression without psychotic features (HCC) Diagnosis:  Principal Problem:   Severe recurrent major depression without psychotic features (HCC) Active Problems:   Nonsuicidal self-injury (HCC)   Essential hypertension   Alcohol abuse  Subjective Data: Patient seen and chart reviewed.  This is a 61 year old man with a history of chronic mood symptoms who presented to the emergency room with thoughts of self-harm depressed and anxious mood feelings of hopelessness.  Patient endorses "not wanting to live" but at the same time claims that he does not actually want to kill himself rather that he just wants to cause himself pain.  Patient is reporting "hearing voices" but does not appear to be responding to internal stimuli in a manner typical of someone having auditory hallucinations.  He has been drinking and is homeless.  So far has been cooperative with plan for inpatient treatment. Continued Clinical Symptoms:  Alcohol Use Disorder Identification Test Final Score (AUDIT): 7 The "Alcohol Use Disorders Identification Test", Guidelines for Use in Primary Care, Second Edition.  World Science writer [facility]). Score between 0-7:  no or low risk or alcohol related problems. Score between 8-15:  moderate risk of alcohol related problems. Score between 16-19:  high risk of alcohol related problems. Score 20 or above:  warrants further diagnostic evaluation for alcohol dependence and treatment.   CLINICAL FACTORS:   Severe Anxiety and/or  Agitation Depression:   Comorbid alcohol abuse/dependence Dysthymia Alcohol/Substance Abuse/Dependencies   Musculoskeletal: Strength & Muscle Tone: within normal limits Gait & Station: normal Patient leans: N/A  Psychiatric Specialty Exam:  Presentation  General Appearance: Appropriate for Environment  Eye Contact:Fair  Speech:No data recorded Speech Volume:Normal  Handedness:No data recorded  Mood and Affect  Mood:Anxious; Depressed; Dysphoric  Affect:Blunt   Thought Process  Thought Processes:Coherent  Descriptions of Associations:Intact  Orientation:Full (Time, Place and Person)  Thought Content:WDL  History of Schizophrenia/Schizoaffective disorder:No  Duration of Psychotic Symptoms:Less than six months  Hallucinations:Hallucinations: Auditory Description of Auditory Hallucinations: "To hurt myself"  Ideas of Reference:Paranoia  Suicidal Thoughts:Suicidal Thoughts: Yes, Passive SI Passive Intent and/or Plan: Without Intent  Homicidal Thoughts:Homicidal Thoughts: No   Sensorium  Memory:Immediate Good  Judgment:Fair  Insight:Fair   Executive Functions  Concentration:Fair  Attention Span:Good  Recall:Good  Fund of Knowledge:Fair  Language:Fair   Psychomotor Activity  Psychomotor Activity:Psychomotor Activity: Normal   Assets  Assets:Desire for Improvement; Resilience   Sleep  Sleep:Sleep: Poor    Physical Exam: Physical Exam Vitals and nursing note reviewed.  Constitutional:      Appearance: Normal appearance.  HENT:     Head: Normocephalic and atraumatic.     Mouth/Throat:     Pharynx: Oropharynx is clear.  Eyes:     Pupils: Pupils are equal, round, and reactive to light.  Cardiovascular:     Rate and Rhythm: Normal rate and regular rhythm.  Pulmonary:     Effort: Pulmonary effort is normal.     Breath sounds: Normal breath sounds.  Abdominal:     General: Abdomen is flat.     Palpations: Abdomen is soft.   Musculoskeletal:  General: Normal range of motion.  Skin:    General: Skin is warm and dry.       Neurological:     General: No focal deficit present.     Mental Status: He is alert. Mental status is at baseline.  Psychiatric:        Attention and Perception: Attention normal.        Mood and Affect: Mood is anxious and depressed.        Speech: Speech is tangential.        Behavior: Behavior is agitated. Behavior is not aggressive.        Thought Content: Thought content is paranoid. Thought content includes suicidal ideation.        Judgment: Judgment is impulsive and inappropriate.   Review of Systems  Constitutional: Negative.   HENT: Negative.    Eyes: Negative.   Respiratory: Negative.    Cardiovascular: Negative.   Gastrointestinal: Negative.   Musculoskeletal: Negative.   Skin: Negative.   Neurological: Negative.   Psychiatric/Behavioral:  Positive for depression, hallucinations, substance abuse and suicidal ideas. The patient is nervous/anxious and has insomnia.   Blood pressure 123/89, pulse 87, temperature 97.9 F (36.6 C), temperature source Oral, resp. rate 18, height 5\' 9"  (1.753 m), weight 74.4 kg, SpO2 99 %. Body mass index is 24.22 kg/m.   COGNITIVE FEATURES THAT CONTRIBUTE TO RISK:  Polarized thinking    SUICIDE RISK:   Moderate:  Frequent suicidal ideation with limited intensity, and duration, some specificity in terms of plans, no associated intent, good self-control, limited dysphoria/symptomatology, some risk factors present, and identifiable protective factors, including available and accessible social support.  PLAN OF CARE: Continue 15-minute checks.  Patient was urged multiple times to talk to staff if he starts to feel the urge to harm himself and he stated he did think he would be able to do this.  Daily monitoring of mood and suicidal ideation.  I certify that inpatient services furnished can reasonably be expected to improve the patient's  condition.   , MD 01/18/2021, 2:49 PM

## 2021-01-18 NOTE — H&P (Signed)
Psychiatric Admission Assessment Adult  Patient Identification: [name] MRN:  [id number] Date of Evaluation:  01/18/2021 Chief Complaint:  Bipolar 1 disorder ([place]) [F31.9] Principal Diagnosis: Severe recurrent major depression without psychotic features ([place]) Diagnosis:  Principal Problem:   Severe recurrent major depression without psychotic features ([place]) Active Problems:   Nonsuicidal self-injury ([place])   Essential hypertension   Alcohol abuse  History of Present Illness: Patient seen and chart reviewed.  This is a 61 year old man who presented to the emergency room after calling the crisis line reporting suicidal ideation.  Patient's chief complaint is "I do not want to go on".  Patient reports that his mood feels bad and hopeless all the time.  He feels overwhelmed by anxiety.  The current stretch of time in which she has been feeling much worse started when he lost his place to live because his long-term girlfriend was placed into a nursing home and the family put him out of the home he had been sharing with her.  He has been homeless for a few months staying with friends but feels it has been an unstable situation in part because there is a lot of drinking there.  Patient reports that he has been self injuring frequently often banging himself on the knee in order to cause pain.  He admits that he has been drinking but claims that he only binge drinks about once a month.  He talks about having "voices" that are telling him to do things mostly to hurt himself but his manner of description of the sound more like intrusive thoughts.  Not reporting any current homicidal intent.  Patient in the interview presents as very dramatic and histrionic.  He does state a desire for improvement.  He has been going to [facility] for quite a while and is on medication and says he is fully compliant with his psychiatric medicine which includes 80 mg a day of fluoxetine, 500 mg of Depakote twice a day and Seroquel  extended release 400 mg at night. Associated Signs/Symptoms: Depression Symptoms:  depressed mood, anhedonia, insomnia, feelings of worthlessness/guilt, difficulty concentrating, hopelessness, recurrent thoughts of death, suicidal thoughts without plan, anxiety, Duration of Depression Symptoms: Greater than two weeks  (Hypo) Manic Symptoms:  Impulsivity, Anxiety Symptoms:  Excessive Worry, Psychotic Symptoms:  Hallucinations: Auditory PTSD Symptoms: Patient references several times his brother having shot himself in front of the patient many years ago. Total Time spent with patient: 1 hour  Past Psychiatric History: Patient says he has chronic mood problems and has suffered with these for many years.  His last hospitalization he reports was in [place] sometime probably a few years ago.  He goes to [facility] regularly.  He says his current medications have been stable for years  Is the patient at risk to self? Yes.    Has the patient been a risk to self in the past 6 months? Yes.    Has the patient been a risk to self within the distant past? Yes.    Is the patient a risk to others? No.  Has the patient been a risk to others in the past 6 months? No.  Has the patient been a risk to others within the distant past? No.   Prior Inpatient Therapy:   Prior Outpatient Therapy:    Alcohol Screening: 1. How often do you have a drink containing alcohol?: 2 to 4 times a month 2. How many drinks containing alcohol do you have on a typical day when you  are drinking?: 10 or more 3. How often do you have six or more drinks on one occasion?: Less than monthly AUDIT-C Score: 7 4. How often during the last year have you found that you were not able to stop drinking once you had started?: Never 5. How often during the last year have you failed to do what was normally expected from you because of drinking?: Never 6. How often during the last year have you needed a first drink in the morning to get  yourself going after a heavy drinking session?: Never 7. How often during the last year have you had a feeling of guilt of remorse after drinking?: Never 8. How often during the last year have you been unable to remember what happened the night before because you had been drinking?: Never 9. Have you or someone else been injured as a result of your drinking?: No 10. Has a relative or friend or a doctor or another health worker been concerned about your drinking or suggested you cut down?: No Alcohol Use Disorder Identification Test Final Score (AUDIT): 7 Alcohol Brief Interventions/Follow-up: Alcohol education/Brief advice Substance Abuse History in the last 12 months:  Yes.   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 echo 5/17: EF 60-65%, no RWMA, LV dias fxn nl, LA mildly dilated, RV sys fxn nl, PASP nl, moderate sized circumferential pericardial effusion was identified, 2.12 cm around the LV free wall, <1 cm around the RV free wall. Features were not c/w tamponade physiology   PTSD (post-traumatic stress disorder)    Witnessed brother's suicide.   Restless leg syndrome    Syncope     Past Surgical History:  Procedure Laterality Date   CYSTOSCOPY WITH URETEROSCOPY AND STENT PLACEMENT     ESOPHAGOGASTRODUODENOSCOPY N/A 01/11/2016   Procedure: ESOPHAGOGASTRODUODENOSCOPY (EGD);  Surgeon: Wilford Corner, MD;  Location[facility] ENDOSCOPY;  Service: Endoscopy;  Laterality: N/A;    ESOPHAGOGASTRODUODENOSCOPY N/A 04/09/2020   Procedure: ESOPHAGOGASTRODUODENOSCOPY (EGD);  Surgeon: Jonathon Bellows, MD;  Location: [facility] ENDOSCOPY;  Service: Gastroenterology;  Laterality: N/A;   INCISION AND DRAINAGE ABSCESS Left 01/02/2018   Procedure: INCISION AND DRAINAGE LEFT KNEE;  Surgeon: Earnestine Leys, MD;  Location: [facility] ORS;  Service: Orthopedics;  Laterality: Left;   JOINT REPLACEMENT Right    TKR   KNEE ARTHROSCOPY Right 06/25/2014   Procedure: ARTHROSCOPY KNEE;  Surgeon: Earnestine Leys, MD;  Location: [facility] ORS;  Service: Orthopedics;  Laterality: Right;  partial arthroscopic medial menisectomy   TOTAL KNEE ARTHROPLASTY Right 04/22/2015   Procedure: TOTAL KNEE ARTHROPLASTY;  Surgeon: Earnestine Leys, MD;  Location: [facility] ORS;  Service: Orthopedics;  Laterality: Right;   TOTAL KNEE ARTHROPLASTY Left 10/30/2017   Procedure: TOTAL KNEE ARTHROPLASTY;  Surgeon: Earnestine Leys, MD;  Location: [facility] ORS;  Service: Orthopedics;  Laterality: Left;   TOTAL KNEE REVISION Left 01/02/2018   Procedure: poly exchange of tibia and patella left knee;  Surgeon: Earnestine Leys, MD;  Location: [facility] ORS;  Service: Orthopedics;  Laterality: Left;   Family History:  Family History  Problem Relation Age of Onset   CVA Mother        deceased at age 63   Depression Brother        Died by suicide at age 38   Family Psychiatric  History: Had a brother who killed himself when he was 12. Tobacco Screening:   Social History:  Social History   Substance and Sexual Activity  Alcohol Use Yes   Comment: occ     Social History   Substance and Sexual Activity  Drug Use Yes   Frequency: 5.0 times per week   Types: Marijuana   Comment: marijuana    Additional Social History: Marital status: Single Does patient have children?: Yes How is patient's relationship with their children?: Patient reports he does not have a good relationship with his children who live in [place].    Pain Medications: See  PTA Prescriptions: See PTA Over the Counter: See PTA History of alcohol / drug use?: Yes Longest period of sobriety (when/how long): Unknown Negative Consequences of Use: Financial Name of Substance 1: Alcohol                  Allergies:   Allergies  Allergen Reactions   Toradol [Ketorolac Tromethamine] Hives   Lab Results:  Results for orders placed or performed during the hospital encounter of 01/18/21 (from the past 48 hour(s))  Comprehensive metabolic panel     Status: Abnormal   Collection Time: 01/17/21 10:46 PM  Result Value Ref Range   Sodium 134 (L) 135 - 145 mmol/L   Potassium 3.9 3.5 - 5.1 mmol/L   Chloride 103 98 - 111 mmol/L   CO2 21 (L) 22 - 32 mmol/L   Glucose, Bld 88 70 - 99 mg/dL    Comment: Glucose reference range applies only to samples taken after fasting for at least 8 hours.   BUN 8 6 - 20 mg/dL   Creatinine, Ser 0.88 0.61 - 1.24 mg/dL   Calcium 9.1 8.9 - 10.3 mg/dL   Total Protein 7.6 6.5 - 8.1 g/dL   Albumin 4.0 3.5 - 5.0 g/dL   AST 18 15 - 41 U/L   ALT 16 0 - 44 U/L   Alkaline Phosphatase 71 38 - 126 U/L   Total Bilirubin 0.4 0.3 - 1.2 mg/dL   GFR, Estimated >60 >60 mL/min    Comment: (NOTE) Calculated using the CKD-EPI Creatinine Equation (2021)    Anion gap 10 5 - 15    Comment: Performed at [facility], [address] [place], [place] [address]  Ethanol     Status: Abnormal   Collection Time: 01/17/21 10:46 PM  Result Value Ref Range   Alcohol, Ethyl (B) 121 (H) <10 mg/dL    Comment: (NOTE) Lowest detectable limit for serum alcohol is 10 mg/dL.  For medical purposes only. Performed at [facility], [address] [place], [place] [id number]   Salicylate level     Status: Abnormal   Collection Time: 01/17/21 10:46 PM  Result Value Ref Range   Salicylate Lvl [id number] (L) 7.0 - 30.0 mg/dL    Comment: Performed at [facility], [address] [place], [place] [address]  Acetaminophen level     Status:  Abnormal   Collection Time: 01/17/21 10:46 PM  Result Value Ref Range   Acetaminophen (Tylenol), Serum <10 (L) 10 - 30 ug/mL  Comment: (NOTE) Therapeutic concentrations vary significantly. A range of 10-30 ug/mL  may be an effective concentration for many patients. However, some  are best treated at concentrations outside of this range. Acetaminophen concentrations >150 ug/mL at 4 hours after ingestion  and >50 ug/mL at 12 hours after ingestion are often associated with  toxic reactions.  Performed at [facility], [address] [place], [place] [address]   cbc     Status: Abnormal   Collection Time: 01/17/21 10:46 PM  Result Value Ref Range   WBC 5.4 4.0 - 10.5 K/uL   RBC 4.47 4.22 - 5.81 MIL/uL   Hemoglobin 13.4 13.0 - 17.0 g/dL   HCT 40.0 39.0 - 52.0 %   MCV 89.5 80.0 - 100.0 fL   MCH 30.0 26.0 - 34.0 pg   MCHC 33.5 30.0 - 36.0 g/dL   RDW 16.4 (H) 11.5 - 15.5 %   Platelets 235 150 - 400 K/uL   nRBC 0.0 0.0 - 0.2 %    Comment: Performed at [facility], [address] [place], [place] [address]  Urine Drug Screen, Qualitative     Status: Abnormal   Collection Time: 01/17/21 10:46 PM  Result Value Ref Range   Tricyclic, Ur Screen NONE DETECTED NONE DETECTED   Amphetamines, Ur Screen NONE DETECTED NONE DETECTED   MDMA (Ecstasy)Ur Screen NONE DETECTED NONE DETECTED   Cocaine Metabolite,Ur [place] NONE DETECTED NONE DETECTED   Opiate, Ur Screen NONE DETECTED NONE DETECTED   Phencyclidine (PCP) Ur S NONE DETECTED NONE DETECTED   Cannabinoid 50 Ng, Ur [place] POSITIVE (A) NONE DETECTED   Barbiturates, Ur Screen NONE DETECTED NONE DETECTED   Benzodiazepine, Ur Scrn NONE DETECTED NONE DETECTED   Methadone Scn, Ur NONE DETECTED NONE DETECTED    Comment: (NOTE) Tricyclics + metabolites, urine    Cutoff 1000 ng/mL Amphetamines + metabolites, urine  Cutoff 1000 ng/mL MDMA (Ecstasy), urine              Cutoff 500 ng/mL Cocaine Metabolite, urine          Cutoff 300  ng/mL Opiate + metabolites, urine        Cutoff 300 ng/mL Phencyclidine (PCP), urine         Cutoff 25 ng/mL Cannabinoid, urine                 Cutoff 50 ng/mL Barbiturates + metabolites, urine  Cutoff 200 ng/mL Benzodiazepine, urine              Cutoff 200 ng/mL Methadone, urine                   Cutoff 300 ng/mL  The urine drug screen provides only a preliminary, unconfirmed analytical test result and should not be used for non-medical purposes. Clinical consideration and professional judgment should be applied to any positive drug screen result due to possible interfering substances. A more specific alternate chemical method must be used in order to obtain a confirmed analytical result. Gas chromatography / mass spectrometry (GC/MS) is the preferred confirm atory method. Performed at [facility], [address], [place],  [address]     Blood Alcohol level:  Lab Results  Component Value Date   ETH 121 (H) 01/17/2021   ETH <10 123XX123    Metabolic Disorder Labs:  Lab Results  Component Value Date   HGBA1C 5.7 (H) 01/21/2019   MPG 116.89 01/21/2019   MPG 102.54 12/30/2017   No results found  for: PROLACTIN Lab Results  Component Value Date   CHOL 174 01/07/2016   TRIG 248 (H) 01/07/2016   HDL 26 (L) 01/07/2016   CHOLHDL 6.7 01/07/2016   VLDL 50 (H) 01/07/2016   LDLCALC 98 01/07/2016   LDLCALC 126 (H) 06/02/2015    Current Medications: Current Facility-Administered Medications  Medication Dose Route Frequency Provider Last Rate Last Admin   acetaminophen (TYLENOL) tablet 650 mg  650 mg Oral Q6H PRN Sherlon Handing, NP       alum & mag hydroxide-simeth (MAALOX/MYLANTA) 200-200-20 MG/5ML suspension 30 mL  30 mL Oral Q4H PRN Sherlon Handing, NP       [START ON 01/19/2021] aspirin EC tablet 325 mg  325 mg Oral Daily Waldon Merl F, NP       divalproex (DEPAKOTE) DR tablet 500 mg  500 mg Oral BID Waldon Merl F, NP       docusate sodium  (COLACE) capsule 100 mg  100 mg Oral BID Sherlon Handing, NP       [START ON 01/19/2021] enalapril (VASOTEC) tablet 10 mg  10 mg Oral Daily Sherlon Handing, NP       [START ON 01/19/2021] FLUoxetine (PROZAC) capsule 80 mg  80 mg Oral Daily Waldon Merl F, NP       gabapentin (NEURONTIN) capsule 400 mg  400 mg Oral TID Sherlon Handing, NP       hydrOXYzine (ATARAX) tablet 50 mg  50 mg Oral TID PRN Sherlon Handing, NP       magnesium hydroxide (MILK OF MAGNESIA) suspension 30 mL  30 mL Oral Daily PRN Sherlon Handing, NP       [START ON 01/19/2021] multivitamin with minerals tablet 1 tablet  1 tablet Oral Daily Waldon Merl F, NP       naltrexone (DEPADE) tablet 50 mg  50 mg Oral Daily Rosha Cocker, Madie Reno, MD       Derrill Memo ON 01/19/2021] pantoprazole (PROTONIX) EC tablet 40 mg  40 mg Oral Daily Barthold, Louise F, NP       QUEtiapine (SEROQUEL XR) 24 hr tablet 600 mg  600 mg Oral QHS Zarahi Fuerst, Madie Reno, MD       PTA Medications: Medications Prior to Admission  Medication Sig Dispense Refill Last Dose   aspirin EC 325 MG tablet Take 1 tablet (325 mg total) by mouth daily. 30 tablet 0    benztropine (COGENTIN) 1 MG tablet Take 1 mg by mouth 2 (two) times daily. (Patient not taking: Reported on 01/18/2021)      diphenoxylate-atropine (LOMOTIL) 2.5-0.025 MG tablet Take 2 tablets by mouth 4 (four) times daily as needed. (Patient not taking: Reported on 01/18/2021)      divalproex (DEPAKOTE ER) 500 MG 24 hr tablet Take 500 mg by mouth 2 (two) times daily. (Patient not taking: Reported on 01/18/2021)      divalproex (DEPAKOTE) 500 MG DR tablet Take 1 tablet (500 mg total) by mouth 2 (two) times daily. 60 tablet 0    docusate sodium (COLACE) 100 MG capsule Take 1 capsule (100 mg total) by mouth 2 (two) times daily. 30 capsule 0    enalapril (VASOTEC) 10 MG tablet Take 1 tablet (10 mg total) by mouth daily. 30 tablet 0    FLUoxetine (PROZAC) 40 MG capsule Take 2 capsules (80 mg total) by mouth daily.  60 capsule 0    gabapentin (NEURONTIN) 400 MG capsule Take 400 mg by mouth 3 (three) times daily.  Multiple Vitamin (MULTIVITAMIN WITH MINERALS) TABS tablet Take 1 tablet by mouth daily.      oxyCODONE (ROXICODONE) 5 MG immediate release tablet Take 1 tablet (5 mg total) by mouth every 8 (eight) hours as needed. (Patient not taking: Reported on 01/18/2021) 15 tablet 0    pantoprazole (PROTONIX) 40 MG tablet Take 1 tablet (40 mg total) by mouth daily. 30 tablet 2    polyethylene glycol (MIRALAX / GLYCOLAX) 17 g packet Take 17 g by mouth daily as needed for mild constipation or moderate constipation. (Patient not taking: Reported on 02/01/2019) 14 each 0    QUEtiapine (SEROQUEL) 50 MG tablet Take 50 mg by mouth 4 (four) times daily as needed (for agitation).       SEROQUEL XR 400 MG 24 hr tablet Take 1 tablet (400 mg total) by mouth at bedtime. 30 tablet 0    traMADol (ULTRAM) 50 MG tablet Take 2 tablets (100 mg total) by mouth every 6 (six) hours. (Patient not taking: Reported on 04/07/2020) 25 tablet 0     Musculoskeletal: Strength & Muscle Tone: within normal limits Gait & Station: normal Patient leans: N/A            Psychiatric Specialty Exam:  Presentation  General Appearance: Appropriate for Environment  Eye Contact:Fair  Speech:No data recorded Speech Volume:Normal  Handedness:No data recorded  Mood and Affect  Mood:Anxious; Depressed; Dysphoric  Affect:Blunt   Thought Process  Thought Processes:Coherent  Duration of Psychotic Symptoms: Less than six months  Past Diagnosis of Schizophrenia or Psychoactive disorder: No  Descriptions of Associations:Intact  Orientation:Full (Time, Place and Person)  Thought Content:WDL  Hallucinations:Hallucinations: Auditory Description of Auditory Hallucinations: "To hurt myself"  Ideas of Reference:Paranoia  Suicidal Thoughts:Suicidal Thoughts: Yes, Passive SI Passive Intent and/or Plan: Without  Intent  Homicidal Thoughts:Homicidal Thoughts: No   Sensorium  Memory:Immediate Good  Judgment:Fair  Insight:Fair   Executive Functions  Concentration:Fair  Attention Span:Good  Recall:Good  Fund of Knowledge:Fair  Language:Fair   Psychomotor Activity  Psychomotor Activity:Psychomotor Activity: Normal   Assets  Assets:Desire for Improvement; Resilience   Sleep  Sleep:Sleep: Poor    Physical Exam: Physical Exam Vitals and nursing note reviewed.  Constitutional:      Appearance: Normal appearance.  HENT:     Head: Normocephalic and atraumatic.     Mouth/Throat:     Pharynx: Oropharynx is clear.  Eyes:     Pupils: Pupils are equal, round, and reactive to light.  Cardiovascular:     Rate and Rhythm: Normal rate and regular rhythm.  Pulmonary:     Effort: Pulmonary effort is normal.     Breath sounds: Normal breath sounds.  Abdominal:     General: Abdomen is flat.     Palpations: Abdomen is soft.  Musculoskeletal:        General: Normal range of motion.  Skin:    General: Skin is warm and dry.  Neurological:     General: No focal deficit present.     Mental Status: He is alert. Mental status is at baseline.  Psychiatric:        Attention and Perception: Attention normal.        Mood and Affect: Mood is anxious and depressed.        Speech: Speech is tangential.        Behavior: Behavior is agitated. Behavior is not aggressive.        Thought Content: Thought content is paranoid. Thought content includes suicidal ideation. Thought content  does not include suicidal plan.        Judgment: Judgment is impulsive and inappropriate.   Review of Systems  Constitutional: Negative.   HENT: Negative.    Eyes: Negative.   Respiratory: Negative.    Cardiovascular: Negative.   Gastrointestinal: Negative.   Musculoskeletal: Negative.   Skin: Negative.   Neurological: Negative.   Psychiatric/Behavioral:  Positive for depression, hallucinations, substance  abuse and suicidal ideas. The patient is nervous/anxious and has insomnia.   Blood pressure 123/89, pulse 87, temperature 97.9 F (36.6 C), temperature source Oral, resp. rate 18, height 5\' 9"  (1.753 m), weight 74.4 kg, SpO2 99 %. Body mass index is 24.22 kg/m.  Treatment Plan Summary: Daily contact with patient to assess and evaluate symptoms and progress in treatment, Medication management, and Plan 61 year old man with chronic mood and anxiety symptoms presents with subacute severe worsening of symptoms in the context of losing his personal and financial support when his girlfriend was placed in a nursing home.  Patient is extremely dramatic in detailing his current symptoms.  Behavior on the ward however has been quite appropriate and cooperative.  Medications reviewed.  At this point I think it is reasonable to make minimal changes and expect that there will be some improvement just with stabilization in the hospital.  I do recommend increasing the Seroquel dose at night to 600 mg for mood stability and also adding naltrexone 50 mg a day for impulsive self-injury.  Patient agreeable.  Involve in individual and group therapy and daily reassess symptoms and suicidal ideation  Observation Level/Precautions:  15 minute checks  Laboratory:  HbAIC  Psychotherapy:    Medications:    Consultations:    Discharge Concerns:    Estimated LOS:  Other:     Physician Treatment Plan for Primary Diagnosis: Severe recurrent major depression without psychotic features ([place]) Long Term Goal(s): Improvement in symptoms so as ready for discharge  Short Term Goals: Ability to disclose and discuss suicidal ideas, Ability to demonstrate self-control will improve, and Ability to identify and develop effective coping behaviors will improve  Physician Treatment Plan for Secondary Diagnosis: Principal Problem:   Severe recurrent major depression without psychotic features ([place]) Active Problems:   Nonsuicidal  self-injury ([place])   Essential hypertension   Alcohol abuse  Long Term Goal(s): Improvement in symptoms so as ready for discharge  Short Term Goals: Compliance with prescribed medications will improve and Ability to identify triggers associated with substance abuse/mental health issues will improve  I certify that inpatient services furnished can reasonably be expected to improve the patient's condition.    Alethia Berthold, MD 1/9/20232:53 PM

## 2021-01-18 NOTE — ED Provider Notes (Signed)
[facility] Provider Note    Event Date/Time   First MD Initiated Contact with Patient 01/18/21 0155     (approximate)   History   Suicidal   HPI  [name] is a 61 y.o. male with prior medical history that is noncontributory but who also reports extensive prior psychiatric history including multiple psychiatric hospitalizations at [facility] and [facility].  He has been diagnosed with bipolar disorder and PTSD.  He presents voluntarily tonight for suicidal ideation and self-harm.  He said that he has a therapist at [facility] but it is not helping.  He said he has been compliant with his psychiatric medications but his symptoms have been gradually getting worse over the last few weeks and have become severe.  He states that he "banged on my left knee with a hammer" repeatedly because he feels better after he causes himself pain.  He is able to ambulate without difficulty and said he does not think he broke anything but he realized that he should get help.  He said that he wants to die and is due to "many reasons", but he would rather get help then kill himself.  He has no homicidal ideation.  Although he has pain in his left knee after his self injury, he has no other medical complaints or concerns at this time.  He has no chest pain or abdominal pain nor shortness of breath.  He admits to some alcohol use.     Physical Exam   Triage Vital Signs: ED Triage Vitals  Enc Vitals Group     BP 01/17/21 2243 (!) 130/93     Pulse Rate 01/17/21 2243 86     Resp 01/17/21 2243 16     Temp 01/17/21 2243 98.4 F (36.9 C)     Temp Source 01/17/21 2243 Oral     SpO2 01/17/21 2243 97 %     Weight --      Height --      Head Circumference --      Peak Flow --      Pain Score 01/17/21 2239 4     Pain Loc --      Pain Edu? --      Excl. in GC? --     Most recent vital signs: Vitals:   01/17/21 2243 01/18/21 0149  BP: (!) 130/93 (!) 136/94  Pulse: 86 80  Resp: 16 16   Temp: 98.4 F (36.9 C) 98.4 F (36.9 C)  SpO2: 97% 99%     General: Awake, no distress.  CV:  Good peripheral perfusion.  Resp:  Normal effort.  Abd:  No distention.  MSK:  Patient has contusions and bruising as well as 1 small superficial laceration not requiring repair on and around his left patella.  However he has normal range of motion and minimal tenderness to palpation. Psych:  Patient reports self injures behavior and ongoing suicidal ideations.  However he shows insight into his condition and wants help.   ED Results / Procedures / Treatments   Labs (all labs ordered are listed, but only abnormal results are displayed) Labs Reviewed  COMPREHENSIVE METABOLIC PANEL - Abnormal; Notable for the following components:      Result Value   Sodium 134 (*)    CO2 21 (*)    All other components within normal limits  ETHANOL - Abnormal; Notable for the following components:   Alcohol, Ethyl (B) 121 (*)    All other components within  normal limits  SALICYLATE LEVEL - Abnormal; Notable for the following components:   Salicylate Lvl <7.0 (*)    All other components within normal limits  ACETAMINOPHEN LEVEL - Abnormal; Notable for the following components:   Acetaminophen (Tylenol), Serum <10 (*)    All other components within normal limits  CBC - Abnormal; Notable for the following components:   RDW 16.4 (*)    All other components within normal limits  URINE DRUG SCREEN, QUALITATIVE ([facility] ONLY) - Abnormal; Notable for the following components:   Cannabinoid 50 Ng, Ur [place] POSITIVE (*)    All other components within normal limits      RADIOLOGY I personally reviewed the patient's knee x-rays and I agree with the interpretation by the radiologist that there is no evidence of acute fracture or dislocation, only minimal soft tissue swelling.    PROCEDURES:  Critical Care performed: No  Procedures   MEDICATIONS ORDERED IN ED: Medications  ondansetron (ZOFRAN-ODT) 4 MG  disintegrating tablet (has no administration in time range)  ondansetron (ZOFRAN-ODT) disintegrating tablet 4 mg (4 mg Oral Given 01/18/21 0440)     IMPRESSION / MDM / ASSESSMENT AND PLAN / ED COURSE  I reviewed the triage vital signs and the nursing notes.                              Differential diagnosis includes, but is not limited to, depression, bipolar disorder, adjustment disorder, mood disorder, substance abuse.  Vital signs stable and within normal limits.  Labs ordered as part of the patient's work-up to include urine drug screen, ethanol level, CBC, comprehensive metabolic panel, salicylate level, and acetaminophen level.  I reviewed the results and he is positive for cannabinoids and ethanol with an ethanol level of 121.  CBC is within normal limits.  Acetaminophen and salicylate levels are negative.  Comprehensive metabolic panel is within normal limits.  I personally reviewed the patient's knee x-rays and there is no evidence of fracture or dislocation and he is ambulatory without any difficulty.  The patient has known bipolar disorder and seems to be having an acute decompensation with depression and suicidal ideation as well as some self injury.  He is medically cleared but would benefit from psychiatric consultation.  I discussed the plan with him and he is in agreement.  I will not place him under involuntary commitment at this time as he strongly wants to be here and wants to get help.  The patient has been placed in psychiatric observation due to the need to provide a safe environment for the patient while obtaining psychiatric consultation and evaluation, as well as ongoing medical and medication management to treat the patient's condition.  The patient has not been placed under full IVC at this time.         FINAL CLINICAL IMPRESSION(S) / ED DIAGNOSES   Final diagnoses:  Suicidal ideation  Self-injurious behavior  Alcoholic intoxication with complication (HCC)      Rx / DC Orders   ED Discharge Orders     None        Note:  This document was prepared using Dragon voice recognition software and may include unintentional dictation errors.   Loleta Rose, MD 01/18/21 [phone]

## 2021-01-18 NOTE — ED Notes (Signed)
Breakfast meal tray given with apple juice

## 2021-01-18 NOTE — ED Notes (Signed)
Emesis X 2. Reports nausea for last 10 minutes; thinks it may be due to sandwich he ate earlier.

## 2021-01-18 NOTE — ED Notes (Signed)
Pt given sandwich tray and beverage.  

## 2021-01-19 DIAGNOSIS — F332 Major depressive disorder, recurrent severe without psychotic features: Secondary | ICD-10-CM | POA: Diagnosis not present

## 2021-01-19 NOTE — Progress Notes (Signed)
Recreation Therapy Notes   Date: 01/19/2021  Time: 10:45 am    Location: Craft room    Behavioral response: Appropriate  Intervention Topic: Self-esteem   Discussion/Intervention:  Group content today was focused on self-esteem. Patient defined self-esteem and where it comes form. The group described reasons self-esteem is important. Individuals stated things that impact self-esteem and positive ways to improve self-esteem. Patient went over the components of self-esteem. The group participated in the intervention where patients were able to identify positive ways to improve their self-esteem.   Clinical Observations/Feedback: Patient came to group and was not engaged. Jamai Dolce LRT/CTRS         Jaeshawn Silvio 01/19/2021 12:22 PM

## 2021-01-19 NOTE — Progress Notes (Signed)
Patient calm and pleasant during assessment. Pt endorses anxiety and depression. Patient observed interacting appropriately with staff and peers on the unit. Pt compliant with medication administration per MD orders. Pt given education, support, and encouragement to be active in his treatment plan. Pt being monitored Q 15 minutes for safety per unit protocol. Pt remains safe on the unit.

## 2021-01-19 NOTE — Progress Notes (Signed)
Patient awake and alert this morning. Present depressed and anxious. Endorsees SI and auditory hallucinations. States, the voices are telling me that I need to hurt myself but I'm not going to do it. "  Informs RN he has been a cutter since the 80's and reports current method of hurting himself is by bumping his knee or hitting his knee. Denies HI and VH. Rates anxiety 10/10. Prn vistaril given. Rates depression 10/10. Denies VH.  Reports no good bowel movements in 2 months. Every else is diarrhea. Endorses 5-6 loose yesterday.  Slight amount of anal irritation.   Takes medication as prescribed.   Labs and vital signs monitored. Patient encouraged to verbalize concerns and encouraged to participate in the milieu setting.  No adverse reactions noted. Cont Q15 minute check for safety.

## 2021-01-19 NOTE — B[facility] Counselor (Signed)
CSW provided patient with the boarding house list.   Penni Homans, MSW, LCSW 01/19/2021 4:06 PM

## 2021-01-19 NOTE — [facility] Suicide Risk Assessment (Signed)
[facility] INPATIENT:  Family/Significant Other Suicide Prevention Education  Suicide Prevention Education:  Patient Refusal for Family/Significant Other Suicide Prevention Education: The patient [name] has refused to provide written consent for family/significant other to be provided Family/Significant Other Suicide Prevention Education during admission and/or prior to discharge.  Physician notified.  SPE completed with pt, as pt refused to consent to family contact. SPI pamphlet provided to pt and pt was encouraged to share information with support network, ask questions, and talk about any concerns relating to SPE. Pt denies access to guns/firearms and verbalized understanding of information provided. Mobile Crisis information also provided to pt.    Harden Mo 01/19/2021, 12:55 PM

## 2021-01-19 NOTE — Progress Notes (Signed)
[facility] MD Progress Note  01/19/2021 3:55 PM [name]  MRN:  [id number] Subjective: Follow-up with this 61 year old man with chronic depression and self-injury.  Patient complained that he still did not sleep very well last night.  Also reports that he continues to have impulsive thoughts of harming himself by hitting himself on the knee.  Could not give any rational reason for doing this.  Patient even reported to staff at one point today that he wanted to hurt himself on the knee.  Passive suicidal thoughts with no specific intent. Principal Problem: Severe recurrent major depression without psychotic features ([place]) Diagnosis: Principal Problem:   Severe recurrent major depression without psychotic features ([place]) Active Problems:   Nonsuicidal self-injury ([place])   Essential hypertension   Alcohol abuse  Total Time spent with patient: 30 minutes  Past Psychiatric History: Long history of chronic depression and self-injury  Past Medical History:  Past Medical History:  Diagnosis Date   Anxiety    Arthritis    knees and hands   Bipolar disorder ([place])    Depression    GERD (gastroesophageal reflux disease)    Hepatitis    HEP "C"   History of kidney stones    Hypertension    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 echo 5/17: EF 60-65%, no RWMA, LV dias fxn nl, LA mildly dilated, RV sys fxn nl, PASP nl, moderate sized circumferential pericardial effusion was identified, 2.12 cm around the LV free wall, <1 cm around the RV free wall. Features were not c/w tamponade physiology   PTSD (post-traumatic stress disorder)    Witnessed brother's suicide.   Restless leg syndrome    Syncope     Past Surgical History:  Procedure Laterality Date   CYSTOSCOPY WITH URETEROSCOPY AND STENT PLACEMENT     ESOPHAGOGASTRODUODENOSCOPY N/A 01/11/2016   Procedure: ESOPHAGOGASTRODUODENOSCOPY (EGD);  Surgeon: Wilford Corner, MD;  Location:  [facility] ENDOSCOPY;  Service: Endoscopy;  Laterality: N/A;   ESOPHAGOGASTRODUODENOSCOPY N/A 04/09/2020   Procedure: ESOPHAGOGASTRODUODENOSCOPY (EGD);  Surgeon: Jonathon Bellows, MD;  Location: [facility] ENDOSCOPY;  Service: Gastroenterology;  Laterality: N/A;   INCISION AND DRAINAGE ABSCESS Left 01/02/2018   Procedure: INCISION AND DRAINAGE LEFT KNEE;  Surgeon: Earnestine Leys, MD;  Location: [facility] ORS;  Service: Orthopedics;  Laterality: Left;   JOINT REPLACEMENT Right    TKR   KNEE ARTHROSCOPY Right 06/25/2014   Procedure: ARTHROSCOPY KNEE;  Surgeon: Earnestine Leys, MD;  Location: [facility] ORS;  Service: Orthopedics;  Laterality: Right;  partial arthroscopic medial menisectomy   TOTAL KNEE ARTHROPLASTY Right 04/22/2015   Procedure: TOTAL KNEE ARTHROPLASTY;  Surgeon: Earnestine Leys, MD;  Location: [facility] ORS;  Service: Orthopedics;  Laterality: Right;   TOTAL KNEE ARTHROPLASTY Left 10/30/2017   Procedure: TOTAL KNEE ARTHROPLASTY;  Surgeon: Earnestine Leys, MD;  Location: [facility] ORS;  Service: Orthopedics;  Laterality: Left;   TOTAL KNEE REVISION Left 01/02/2018   Procedure: poly exchange of tibia and patella left knee;  Surgeon: Earnestine Leys, MD;  Location: [facility] ORS;  Service: Orthopedics;  Laterality: Left;   Family History:  Family History  Problem Relation Age of Onset   CVA Mother        deceased at age 69   Depression Brother        Died by suicide at age 45   Family Psychiatric  History: See previous.  Brother suicide Social History:  Social History   Substance and Sexual Activity  Alcohol Use Yes   Comment: occ     Social History   Substance and Sexual Activity  Drug Use Yes   Frequency: 5.0 times per week   Types: Marijuana   Comment: marijuana    Social History   Socioeconomic History   Marital status: Single    Spouse name: Not on file   Number of children: Not on file   Years of education: Not on file   Highest education level: Not on file  Occupational History   Not on file   Tobacco Use   Smoking status: Former    Packs/day: 0.75    Years: 20.00    Pack years: 15.00    Types: Cigarettes    Quit date: 05/16/1984    Years since quitting: 36.7   Smokeless tobacco: Never  Vaping Use   Vaping Use: Never used  Substance and Sexual Activity   Alcohol use: Yes    Comment: occ   Drug use: Yes    Frequency: 5.0 times per week    Types: Marijuana    Comment: marijuana   Sexual activity: Not on file  Other Topics Concern   Not on file  Social History Narrative   Not on file   Social Determinants of Health   Financial Resource Strain: Not on file  Food Insecurity: Not on file  Transportation Needs: Not on file  Physical Activity: Not on file  Stress: Not on file  Social Connections: Not on file   Additional Social History:    Pain Medications: See PTA Prescriptions: See PTA Over the Counter: See PTA History of alcohol / drug use?: Yes Longest period of sobriety (when/how long): Unknown Negative Consequences of Use: Financial Name of Substance 1: Alcohol                  Sleep: Poor  Appetite:  Fair  Current Medications: Current Facility-Administered Medications  Medication Dose Route Frequency Provider Last Rate Last Admin   acetaminophen (TYLENOL) tablet 650 mg  650 mg Oral Q6H PRN Vanetta Mulders, NP   650 mg at 01/18/21 2024   alum & mag hydroxide-simeth (MAALOX/MYLANTA) 200-200-20 MG/5ML suspension 30 mL  30 mL Oral Q4H PRN Gabriel Cirri F, NP       aspirin EC tablet 325 mg  325 mg Oral Daily Gabriel Cirri F, NP       divalproex (DEPAKOTE) DR tablet 500 mg  500 mg Oral BID Gabriel Cirri F, NP   500 mg at 01/19/21 5003   docusate sodium (COLACE) capsule 100 mg  100 mg Oral BID Gabriel Cirri F, NP   100 mg at 01/19/21 0814   enalapril (VASOTEC) tablet 10 mg  10 mg Oral Daily Gabriel Cirri F, NP   10 mg at 01/19/21 0814   FLUoxetine (PROZAC) capsule 80 mg  80 mg Oral Daily Gabriel Cirri F, NP   80 mg at 01/19/21  7048   gabapentin (NEURONTIN) capsule 400 mg  400 mg Oral TID Vanetta Mulders, NP   400 mg at 01/19/21 8891   hydrOXYzine (ATARAX) tablet 50 mg  50 mg Oral TID PRN Vanetta Mulders, NP   50 mg at 01/19/21 0816   magnesium hydroxide (MILK OF MAGNESIA) suspension 30 mL  30 mL Oral Daily PRN Vanetta Mulders, NP       multivitamin with minerals tablet 1 tablet  1 tablet Oral Daily Gabriel Cirri F, NP   1 tablet at 01/19/21 [phone]  naltrexone (DEPADE) tablet 50 mg  50 mg Oral Daily Lavante Toso, Madie Reno, MD   50 mg at 01/19/21 [id number]   pantoprazole (PROTONIX) EC tablet 40 mg  40 mg Oral Daily Waldon Merl F, NP   40 mg at 01/19/21 [id number]   QUEtiapine (SEROQUEL XR) 24 hr tablet 600 mg  600 mg Oral QHS Tashon Capp, Madie Reno, MD   600 mg at 01/18/21 2125    Lab Results:  Results for orders placed or performed during the hospital encounter of 01/18/21 (from the past 48 hour(s))  Lipid panel     Status: Abnormal   Collection Time: 01/18/21  3:49 PM  Result Value Ref Range   Cholesterol 202 (H) 0 - 200 mg/dL   Triglycerides 172 (H) <150 mg/dL   HDL 40 (L) >40 mg/dL   Total CHOL/HDL Ratio 5.1 RATIO   VLDL 34 0 - 40 mg/dL   LDL Cholesterol 128 (H) 0 - 99 mg/dL    Comment:        Total Cholesterol/HDL:CHD Risk Coronary Heart Disease Risk Table                     Men   Women  1/2 Average Risk   3.4   3.3  Average Risk       5.0   4.4  2 X Average Risk   9.6   7.1  3 X Average Risk  23.4   11.0        Use the calculated Patient Ratio above and the CHD Risk Table to determine the patient's CHD Risk.        ATP III CLASSIFICATION (LDL):  <100     mg/dL   Optimal  100-129  mg/dL   Near or Above                    Optimal  130-159  mg/dL   Borderline  160-189  mg/dL   High  >190     mg/dL   Very High Performed at [facility], [address] [place], [place] [address]   Hemoglobin A1c     Status: None   Collection Time: 01/18/21  3:49 PM  Result Value Ref Range   Hgb A1c MFr Bld  5.3 4.8 - 5.6 %    Comment: (NOTE) Pre diabetes:          5.7%-6.4%  Diabetes:              >6.4%  Glycemic control for   <7.0% adults with diabetes    Mean Plasma Glucose 105.41 mg/dL    Comment: Performed at [facility] [address], [place], [place] [address]    Blood Alcohol level:  Lab Results  Component Value Date   ETH 121 (H) 01/17/2021   ETH <10 [id number]    Metabolic Disorder Labs: Lab Results  Component Value Date   HGBA1C 5.3 01/18/2021   MPG 105.41 01/18/2021   MPG 116.89 01/21/2019   No results found for: PROLACTIN Lab Results  Component Value Date   CHOL 202 (H) 01/18/2021   TRIG 172 (H) 01/18/2021   HDL 40 (L) 01/18/2021   CHOLHDL 5.1 01/18/2021   VLDL 34 01/18/2021   LDLCALC 128 (H) 01/18/2021   LDLCALC 98 01/07/2016    Physical Findings: AIMS:  , ,  ,  ,    CIWA:    COWS:     Musculoskeletal: Strength & Muscle Tone: within normal  limits Gait & Station: normal Patient leans: N/A  Psychiatric Specialty Exam:  Presentation  General Appearance: Appropriate for Environment  Eye Contact:Fair  Speech:No data recorded Speech Volume:Normal  Handedness:No data recorded  Mood and Affect  Mood:Anxious; Depressed; Dysphoric  Affect:Blunt   Thought Process  Thought Processes:Coherent  Descriptions of Associations:Intact  Orientation:Full (Time, Place and Person)  Thought Content:WDL  History of Schizophrenia/Schizoaffective disorder:No  Duration of Psychotic Symptoms:Less than six months  Hallucinations:Hallucinations: Auditory Description of Auditory Hallucinations: "To hurt myself"  Ideas of Reference:Paranoia  Suicidal Thoughts:Suicidal Thoughts: Yes, Passive SI Passive Intent and/or Plan: Without Intent  Homicidal Thoughts:Homicidal Thoughts: No   Sensorium  Memory:Immediate Good  Judgment:Fair  Insight:Fair   Executive Functions  Concentration:Fair  Attention Span:Good  Recall:Good  Fund of  Knowledge:Fair  Language:Fair   Psychomotor Activity  Psychomotor Activity:Psychomotor Activity: Normal   Assets  Assets:Desire for Improvement; Resilience   Sleep  Sleep:Sleep: Poor    Physical Exam: Physical Exam Vitals and nursing note reviewed.  Constitutional:      Appearance: Normal appearance.  HENT:     Head: Normocephalic and atraumatic.     Mouth/Throat:     Pharynx: Oropharynx is clear.  Eyes:     Pupils: Pupils are equal, round, and reactive to light.  Cardiovascular:     Rate and Rhythm: Normal rate and regular rhythm.  Pulmonary:     Effort: Pulmonary effort is normal.     Breath sounds: Normal breath sounds.  Abdominal:     General: Abdomen is flat.     Palpations: Abdomen is soft.  Musculoskeletal:        General: Normal range of motion.  Skin:    General: Skin is warm and dry.  Neurological:     General: No focal deficit present.     Mental Status: He is alert. Mental status is at baseline.  Psychiatric:        Attention and Perception: Attention normal.        Mood and Affect: Mood is depressed. Affect is blunt.        Speech: Speech is delayed.        Behavior: Behavior is slowed.        Thought Content: Thought content includes suicidal ideation. Thought content does not include suicidal plan.        Cognition and Memory: Cognition is impaired.        Judgment: Judgment is impulsive.   Review of Systems  Constitutional: Negative.   HENT: Negative.    Eyes: Negative.   Respiratory: Negative.    Cardiovascular: Negative.   Gastrointestinal: Negative.   Musculoskeletal: Negative.   Skin: Negative.   Neurological: Negative.   Psychiatric/Behavioral:  Positive for depression and suicidal ideas. The patient is nervous/anxious and has insomnia.   Blood pressure 132/81, pulse 94, temperature 97.8 F (36.6 C), temperature source Oral, resp. rate 18, height 5\' 9"  (1.753 m), weight 74.4 kg, SpO2 97 %. Body mass index is 24.22  kg/m.   Treatment Plan Summary: Medication management and Plan supportive counseling and therapy.  Review of overall treatment plan with patient.  Continue current dose of Seroquel.  No change to medicine for now.  Work with the patient to try to get him to agree to a plan to manage his self-injury behaviors so that we do not have to put him on a closer monitoring system.  Attend groups and interact with others.  Alethia Berthold, MD 01/19/2021, 3:55 PM

## 2021-01-19 NOTE — Plan of Care (Signed)
°  Problem: Education: °Goal: Ability to state activities that reduce stress will improve °Outcome: Progressing °  °Problem: Coping: °Goal: Ability to identify and develop effective coping behavior will improve °Outcome: Progressing °  °Problem: Education: °Goal: Utilization of techniques to improve thought processes will improve °Outcome: Progressing °Goal: Knowledge of the prescribed therapeutic regimen will improve °Outcome: Progressing °  °Problem: Education: °Goal: Knowledge of [facility] Education information/materials will improve °Outcome: Progressing °Goal: Emotional status will improve °Outcome: Progressing °Goal: Mental status will improve °Outcome: Progressing °Goal: Verbalization of understanding the information provided will improve °Outcome: Progressing °  °Problem: Education: °Goal: Knowledge of [facility] Education information/materials will improve °Outcome: Progressing °Goal: Emotional status will improve °Outcome: Progressing °Goal: Mental status will improve °Outcome: Progressing °Goal: Verbalization of understanding the information provided will improve °Outcome: Progressing °  °Problem: Education: °Goal: Ability to incorporate positive changes in behavior to improve self-esteem will improve °Outcome: Progressing °  °Problem: Health Behavior/Discharge Planning: °Goal: Ability to remain free from injury will improve °Outcome: Progressing °  °

## 2021-01-19 NOTE — Group Note (Signed)
[facility] LCSW Group Therapy Note ° ° °Group Date: 01/19/2021 °Start Time: 1300 °End Time: 1400 ° °Type of Therapy/Topic:  Group Therapy:  Feelings about Diagnosis ° °Participation Level:  Did Not Attend  ° ° °Description of Group:   ° This group will allow patients to explore their thoughts and feelings about diagnoses they have received. Patients will be guided to explore their level of understanding and acceptance of these diagnoses. Facilitator will encourage patients to process their thoughts and feelings about the reactions of others to their diagnosis, and will guide patients in identifying ways to discuss their diagnosis with significant others in their lives. This group will be process-oriented, with patients participating in exploration of their own experiences as well as giving and receiving support and challenge from other group members. ° ° °Therapeutic Goals: °1. Patient will demonstrate understanding of diagnosis as evidence by identifying two or more symptoms of the disorder:  °2. Patient will be able to express two feelings regarding the diagnosis °3. Patient will demonstrate ability to communicate their needs through discussion and/or role plays ° °Summary of Patient Progress: °X ° ° °Therapeutic Modalities:   °Cognitive Behavioral Therapy °Brief Therapy °Feelings Identification  ° ° °Isauro Skelley J Steffan Caniglia, LCSW °

## 2021-01-20 DIAGNOSIS — F332 Major depressive disorder, recurrent severe without psychotic features: Secondary | ICD-10-CM | POA: Diagnosis not present

## 2021-01-20 MED ORDER — HALOPERIDOL 5 MG PO TABS
5.0000 mg | ORAL_TABLET | Freq: Four times a day (QID) | ORAL | Status: DC | PRN
  Administered 2021-01-21 – 2021-01-27 (×8): 5 mg via ORAL
  Filled 2021-01-20 (×8): qty 1

## 2021-01-20 MED ORDER — COVID-19 MRNA VAC-TRIS(PFIZER) 30 MCG/0.3ML IM SUSP
0.3000 mL | Freq: Once | INTRAMUSCULAR | Status: AC
  Administered 2021-01-21: 0.3 mL via INTRAMUSCULAR
  Filled 2021-01-20 (×2): qty 0.3

## 2021-01-20 MED ORDER — LITHIUM CARBONATE ER 450 MG PO TBCR
450.0000 mg | EXTENDED_RELEASE_TABLET | Freq: Two times a day (BID) | ORAL | Status: DC
  Administered 2021-01-20 – 2021-01-26 (×13): 450 mg via ORAL
  Filled 2021-01-20 (×13): qty 1

## 2021-01-20 MED ORDER — COVID-19 MRNA VAC-TRIS(PFIZER) 30 MCG/0.3ML IM SUSP: 0.3000 mL | Freq: Once | INTRAMUSCULAR | Status: DC

## 2021-01-20 MED ORDER — HALOPERIDOL LACTATE 5 MG/ML IJ SOLN
5.0000 mg | Freq: Four times a day (QID) | INTRAMUSCULAR | Status: DC | PRN
  Administered 2021-01-20: 5 mg via INTRAMUSCULAR
  Filled 2021-01-20: qty 1

## 2021-01-20 NOTE — Progress Notes (Signed)
Recreation Therapy Notes   Date: 01/20/2021  Time: 10:30am    Location: Craft room    Behavioral response: Appropriate  Intervention Topic: Relaxation     Discussion/Intervention:  Group content today was focused on relaxation. The group defined relaxation and identified healthy ways to relax. Individuals expressed how much time they spend relaxing. Patients expressed how much their life would be if they did not make time for themselves to relax. The group stated ways they could improve their relaxation techniques in the future.  Individuals participated in the intervention Time to Relax where they had a chance to experience different relaxation techniques.   Clinical Observations/Feedback: Patient came to group and identified walking and sometimes walking on the beach as what relaxes him. Individual engaged in the intervention and was social with staff and peers.  [name] LRT/CTRS         [name] 01/20/2021 12:38 PM

## 2021-01-20 NOTE — Progress Notes (Signed)
Patient presents to medication window for afternoon medication. Patient request RN look at his left knee. States, "My knee is still bleeding, I need you to look at it." RN assesses knee and noted a that Band-Aid that was previously placed is saturated with sanguineous drainage. Incision has a small open in the center that is self inflicted by the patient.   New Band-aid placed and MD notified. Cont to monitor.

## 2021-01-20 NOTE — Consult Note (Signed)
Subjective:   CC: left knee laceration  HPI:  [name] is a 61 y.o. male who was consulted by Clapacs for evaluation of above.  First noted today.  Symptoms include: Pain is sharp, minimal.  Exacerbated by touch.  Alleviated by nothing specific.  Associated with nothing specific.  Report of self harm and cutting in the area, which seemed to be healing but patient noted to stretch the wound and reopen it.     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  Past Surgical History:  has a past surgical history that includes Cystoscopy with ureteroscopy and stent placement; Knee arthroscopy (Right, 06/25/2014); Total knee arthroplasty (Right, 04/22/2015); Esophagogastroduodenoscopy (N/A, 01/11/2016); Joint replacement (Right); Total knee arthroplasty (Left, 10/30/2017); Incision and drainage abscess (Left, 01/02/2018); Total knee revision (Left, 01/02/2018); and Esophagogastroduodenoscopy (N/A, 04/09/2020).  Family History: family history includes CVA in his mother; Depression in his brother.  Social History:  reports that he quit smoking about 36 years ago. His smoking use included cigarettes. He has a 15.00 pack-year smoking history. He has never used smokeless tobacco. He reports current alcohol use. He reports current drug use. Frequency: 5.00 times per week. Drug: Marijuana.  Current Medications:  Prior to Admission medications   Medication Sig Start Date End Date Taking? Authorizing Provider  aspirin EC 325 MG tablet Take 1 tablet (325 mg total) by mouth daily. 11/03/17   Earnestine Leys, MD  benztropine (COGENTIN) 1 MG tablet Take 1 mg by mouth 2 (two) times  daily. Patient not taking: Reported on 01/18/2021 03/05/20   [provider]  diphenoxylate-atropine (LOMOTIL) 2.5-0.025 MG tablet Take 2 tablets by mouth 4 (four) times daily as needed. Patient not taking: Reported on 01/18/2021 12/02/20   [provider]  divalproex (DEPAKOTE ER) 500 MG 24 hr tablet Take 500 mg by mouth 2 (two) times daily. Patient not taking: Reported on 01/18/2021 03/05/20   [provider]  divalproex (DEPAKOTE) 500 MG DR tablet Take 1 tablet (500 mg total) by mouth 2 (two) times daily. 11/14/20 01/18/21  Lucrezia Starch, MD  docusate sodium (COLACE) 100 MG capsule Take 1 capsule (100 mg total) by mouth 2 (two) times daily. 11/14/20   Lucrezia Starch, MD  enalapril (VASOTEC) 10 MG tablet Take 1 tablet (10 mg total) by mouth daily. 11/14/20 01/18/21  Lucrezia Starch, MD  FLUoxetine (PROZAC) 40 MG capsule Take 2 capsules (80 mg total) by mouth daily. 11/14/20 01/18/21  Lucrezia Starch, MD  gabapentin (NEURONTIN) 400 MG capsule Take 400 mg by mouth 3 (three) times daily. 03/24/20   [provider]  Multiple Vitamin (MULTIVITAMIN WITH MINERALS) TABS tablet Take 1 tablet by mouth daily.    [provider]  oxyCODONE (ROXICODONE) 5 MG immediate release tablet Take 1 tablet (5 mg total) by mouth every 8 (eight) hours as needed. Patient not taking: Reported on 01/18/2021 04/12/20 04/12/21  Harvest Dark, MD  pantoprazole (PROTONIX) 40 MG tablet Take 1 tablet (40 mg total) by mouth daily. 11/14/20 02/12/21  Lucrezia Starch, MD  polyethylene glycol (MIRALAX / GLYCOLAX) 17 g packet Take 17 g by mouth daily as needed for mild constipation or moderate constipation. Patient not taking: Reported on 02/01/2019 01/25/19   Tylene Fantasia, PA-C  QUEtiapine (SEROQUEL) 50 MG tablet Take 50 mg by mouth 4 (four) times daily as needed (for agitation).     [provider]  SEROQUEL XR 400 MG 24 hr tablet Take 1 tablet (400 mg total) by mouth at bedtime. 11/14/20  01/18/21  Gilles Chiquito, MD  traMADol (ULTRAM) 50 MG tablet Take 2 tablets (100 mg total) by mouth every 6 (six) hours. Patient not taking: Reported on 04/07/2020 01/25/19   Donovan Kail, PA-C    Allergies:  Allergies  Allergen Reactions   Toradol [Ketorolac Tromethamine] Hives    ROS:  General: Denies weight loss, weight gain, fatigue, fevers, chills, and night sweats. Eyes: Denies blurry vision, double vision, eye pain, itchy eyes, and tearing. Ears: Denies hearing loss, earache, and ringing in ears. Nose: Denies sinus pain, congestion, infections, runny nose, and nosebleeds. Mouth/throat: Denies hoarseness, sore throat, bleeding gums, and difficulty swallowing. Heart: Denies chest pain, palpitations, racing heart, irregular heartbeat, leg pain or swelling, and decreased activity tolerance. Respiratory: Denies breathing difficulty, shortness of breath, wheezing, cough, and sputum. GI: Denies change in appetite, heartburn, nausea, vomiting, constipation, diarrhea, and blood in stool. GU: Denies difficulty urinating, pain with urinating, urgency, frequency, blood in urine. Musculoskeletal: Denies joint stiffness, pain, swelling, muscle weakness. Skin: Denies rash, itching, mass, tumors, sores, and boils Neurologic: Denies headache, fainting, dizziness, seizures, numbness, and tingling. Psychiatric: Denies depression, anxiety, difficulty sleeping, and memory loss. Endocrine: Denies heat or cold intolerance, and increased thirst or urination. Blood/lymph: Denies easy bruising, easy bruising, and swollen glands     Objective:     BP 129/71 (BP Location: Right Arm)    Pulse 89    Temp 97.7 F (36.5 C) (Oral)    Resp 17    Ht 5\' 9"  (1.753 m)    Wt 74.4 kg    SpO2 99%    BMI 24.22 kg/m   Constitutional :  alert, cooperative, appears stated age, and no distress  Lymphatics/Throat:  no asymmetry, masses, or scars  Respiratory:  clear to auscultation bilaterally  Cardiovascular:   regular rate and rhythm  Gastrointestinal: soft, non-tender; bowel sounds normal; no masses,  no organomegaly.  Musculoskeletal: Steady gait and movement  Skin: Cool and moist, left knee laceration noted, measuring approx 3cm x 1cm wide with no visible tendon or deeper structures.  No active bleeding or discharge, some minor skin irritaion surrounding it but n obvious infection.  Psychiatric: Normal affect, non-agitated, not confused       LABS:  CMP Latest Ref Rng & Units 01/17/2021 11/14/2020 04/12/2020  Glucose 70 - 99 mg/dL 88 709(U) 438(V)  BUN 6 - 20 mg/dL 8 13 19   Creatinine 0.61 - 1.24 mg/dL 8.18 4.03 7.54  Sodium 135 - 145 mmol/L 134(L) 135 134(L)  Potassium 3.5 - 5.1 mmol/L 3.9 4.5 3.4(L)  Chloride 98 - 111 mmol/L 103 102 107  CO2 22 - 32 mmol/L 21(L) 24 19(L)  Calcium 8.9 - 10.3 mg/dL 9.1 9.7 3.6(G)  Total Protein 6.5 - 8.1 g/dL 7.6 7.8 6.8  Total Bilirubin 0.3 - 1.2 mg/dL 0.4 1.0 0.6  Alkaline Phos 38 - 126 U/L 71 68 48  AST 15 - 41 U/L 18 29 84(H)  ALT 0 - 44 U/L 16 18 41   CBC Latest Ref Rng & Units 01/17/2021 11/14/2020 04/12/2020  WBC 4.0 - 10.5 K/uL 5.4 12.1(H) 11.2(H)  Hemoglobin 13.0 - 17.0 g/dL 67.7 03.4 11.9(L)  Hematocrit 39.0 - 52.0 % 40.0 38.7(L) 34.8(L)  Platelets 150 -  400 K/uL 235 269 202    RADS: N/a  Assessment:     Left knee laceration   Plan:     No need for additional intervention based on clinical appearance and depth at this time.  Recommend steristrips to keep area covered until healed.  Please call surgery with additional questions or concerns.

## 2021-01-20 NOTE — BH IP Treatment Plan (Signed)
Interdisciplinary Treatment and Diagnostic Plan Update  01/20/2021 Time of Session: 9:30AM [name] MRN: [id number]  Principal Diagnosis: Severe recurrent major depression without psychotic features [facility])  Secondary Diagnoses: Principal Problem:   Severe recurrent major depression without psychotic features ([place]) Active Problems:   Nonsuicidal self-injury ([place])   Essential hypertension   Alcohol abuse   Current Medications:  Current Facility-Administered Medications  Medication Dose Route Frequency Provider Last Rate Last Admin   acetaminophen (TYLENOL) tablet 650 mg  650 mg Oral Q6H PRN Waldon Merl F, NP   650 mg at 01/18/21 2024   alum & mag hydroxide-simeth (MAALOX/MYLANTA) 200-200-20 MG/5ML suspension 30 mL  30 mL Oral Q4H PRN Waldon Merl F, NP       aspirin EC tablet 325 mg  325 mg Oral Daily Waldon Merl F, NP   325 mg at 01/20/21 0804   COVID-19 mRNA Vac-TriS ([employer]) injection 0.3 mL  0.3 mL Intramuscular ONCE-1600 Clapacs, John T, MD       docusate sodium (COLACE) capsule 100 mg  100 mg Oral BID Waldon Merl F, NP   100 mg at 01/20/21 0804   enalapril (VASOTEC) tablet 10 mg  10 mg Oral Daily Waldon Merl F, NP   10 mg at 01/20/21 0804   FLUoxetine (PROZAC) capsule 80 mg  80 mg Oral Daily Waldon Merl F, NP   80 mg at 01/20/21 0804   gabapentin (NEURONTIN) capsule 400 mg  400 mg Oral TID Sherlon Handing, NP   400 mg at 01/20/21 1218   hydrOXYzine (ATARAX) tablet 50 mg  50 mg Oral TID PRN Sherlon Handing, NP   50 mg at 01/20/21 1218   lithium carbonate (ESKALITH) CR tablet 450 mg  450 mg Oral Q12H Clapacs, John T, MD   450 mg at 01/20/21 1218   magnesium hydroxide (MILK OF MAGNESIA) suspension 30 mL  30 mL Oral Daily PRN Waldon Merl F, NP       multivitamin with minerals tablet 1 tablet  1 tablet Oral Daily Waldon Merl F, NP   1 tablet at 01/20/21 0804   naltrexone (DEPADE) tablet 50 mg  50 mg Oral Daily Clapacs, Madie Reno, MD   50  mg at 01/20/21 0804   pantoprazole (PROTONIX) EC tablet 40 mg  40 mg Oral Daily Waldon Merl F, NP   40 mg at 01/20/21 8588   QUEtiapine (SEROQUEL XR) 24 hr tablet 600 mg  600 mg Oral QHS Clapacs, John T, MD   600 mg at 01/19/21 2014   PTA Medications: Medications Prior to Admission  Medication Sig Dispense Refill Last Dose   aspirin EC 325 MG tablet Take 1 tablet (325 mg total) by mouth daily. 30 tablet 0    benztropine (COGENTIN) 1 MG tablet Take 1 mg by mouth 2 (two) times daily. (Patient not taking: Reported on 01/18/2021)      diphenoxylate-atropine (LOMOTIL) 2.5-0.025 MG tablet Take 2 tablets by mouth 4 (four) times daily as needed. (Patient not taking: Reported on 01/18/2021)      divalproex (DEPAKOTE ER) 500 MG 24 hr tablet Take 500 mg by mouth 2 (two) times daily. (Patient not taking: Reported on 01/18/2021)      divalproex (DEPAKOTE) 500 MG DR tablet Take 1 tablet (500 mg total) by mouth 2 (two) times daily. 60 tablet 0    docusate sodium (COLACE) 100 MG capsule Take 1 capsule (100 mg total) by mouth 2 (two) times daily. 30 capsule 0  enalapril (VASOTEC) 10 MG tablet Take 1 tablet (10 mg total) by mouth daily. 30 tablet 0    FLUoxetine (PROZAC) 40 MG capsule Take 2 capsules (80 mg total) by mouth daily. 60 capsule 0    gabapentin (NEURONTIN) 400 MG capsule Take 400 mg by mouth 3 (three) times daily.      Multiple Vitamin (MULTIVITAMIN WITH MINERALS) TABS tablet Take 1 tablet by mouth daily.      oxyCODONE (ROXICODONE) 5 MG immediate release tablet Take 1 tablet (5 mg total) by mouth every 8 (eight) hours as needed. (Patient not taking: Reported on 01/18/2021) 15 tablet 0    pantoprazole (PROTONIX) 40 MG tablet Take 1 tablet (40 mg total) by mouth daily. 30 tablet 2    polyethylene glycol (MIRALAX / GLYCOLAX) 17 g packet Take 17 g by mouth daily as needed for mild constipation or moderate constipation. (Patient not taking: Reported on 02/01/2019) 14 each 0    QUEtiapine (SEROQUEL) 50 MG  tablet Take 50 mg by mouth 4 (four) times daily as needed (for agitation).       SEROQUEL XR 400 MG 24 hr tablet Take 1 tablet (400 mg total) by mouth at bedtime. 30 tablet 0    traMADol (ULTRAM) 50 MG tablet Take 2 tablets (100 mg total) by mouth every 6 (six) hours. (Patient not taking: Reported on 04/07/2020) 25 tablet 0     Patient Stressors: Financial difficulties   Loss of relationship   Traumatic event    Patient Strengths: Average or above average intelligence  Communication skills  General fund of knowledge  Motivation for treatment/growth   Treatment Modalities: Medication Management, Group therapy, Case management,  1 to 1 session with clinician, Psychoeducation, Recreational therapy.   Physician Treatment Plan for Primary Diagnosis: Severe recurrent major depression without psychotic features ([place]) Long Term Goal(s): Improvement in symptoms so as ready for discharge   Short Term Goals: Compliance with prescribed medications will improve Ability to identify triggers associated with substance abuse/mental health issues will improve Ability to disclose and discuss suicidal ideas Ability to demonstrate self-control will improve Ability to identify and develop effective coping behaviors will improve  Medication Management: Evaluate patient's response, side effects, and tolerance of medication regimen.  Therapeutic Interventions: 1 to 1 sessions, Unit Group sessions and Medication administration.  Evaluation of Outcomes: Not Met  Physician Treatment Plan for Secondary Diagnosis: Principal Problem:   Severe recurrent major depression without psychotic features ([place]) Active Problems:   Nonsuicidal self-injury ([place])   Essential hypertension   Alcohol abuse  Long Term Goal(s): Improvement in symptoms so as ready for discharge   Short Term Goals: Compliance with prescribed medications will improve Ability to identify triggers associated with substance abuse/mental health  issues will improve Ability to disclose and discuss suicidal ideas Ability to demonstrate self-control will improve Ability to identify and develop effective coping behaviors will improve     Medication Management: Evaluate patient's response, side effects, and tolerance of medication regimen.  Therapeutic Interventions: 1 to 1 sessions, Unit Group sessions and Medication administration.  Evaluation of Outcomes: Not Met   RN Treatment Plan for Primary Diagnosis: Severe recurrent major depression without psychotic features ([place]) Long Term Goal(s): Knowledge of disease and therapeutic regimen to maintain health will improve  Short Term Goals: Ability to remain free from injury will improve, Ability to verbalize frustration and anger appropriately will improve, Ability to demonstrate self-control, Ability to participate in decision making will improve, Ability to verbalize feelings will improve, Ability  to disclose and discuss suicidal ideas, Ability to identify and develop effective coping behaviors will improve, and Compliance with prescribed medications will improve  Medication Management: RN will administer medications as ordered by provider, will assess and evaluate patient's response and provide education to patient for prescribed medication. RN will report any adverse and/or side effects to prescribing provider.  Therapeutic Interventions: 1 on 1 counseling sessions, Psychoeducation, Medication administration, Evaluate responses to treatment, Monitor vital signs and CBGs as ordered, Perform/monitor CIWA, COWS, AIMS and Fall Risk screenings as ordered, Perform wound care treatments as ordered.  Evaluation of Outcomes: Not Met   LCSW Treatment Plan for Primary Diagnosis: Severe recurrent major depression without psychotic features ([place]) Long Term Goal(s): Safe transition to appropriate next level of care at discharge, Engage patient in therapeutic group addressing interpersonal  concerns.  Short Term Goals: Engage patient in aftercare planning with referrals and resources, Increase social support, Increase ability to appropriately verbalize feelings, Increase emotional regulation, Facilitate acceptance of mental health diagnosis and concerns, and Increase skills for wellness and recovery  Therapeutic Interventions: Assess for all discharge needs, 1 to 1 time with Social worker, Explore available resources and support systems, Assess for adequacy in community support network, Educate family and significant other(s) on suicide prevention, Complete Psychosocial Assessment, Interpersonal group therapy.  Evaluation of Outcomes: Not Met   Progress in Treatment: Attending groups: Yes. Participating in groups: No. Taking medication as prescribed: Yes. Toleration medication: Yes. Family/Significant other contact made: No, will contact:  when given permission as pt declines collateral/familial contacts currently. Patient understands diagnosis: No. Discussing patient identified problems/goals with staff: Yes. Medical problems stabilized or resolved: Yes. Denies suicidal/homicidal ideation: No. Issues/concerns per patient self-inventory: No. Other: none.  New problem(s) identified: No, Describe:  none.  New Short Term/Long Term Goal(s): medication management for mood stabilization; elimination of SI thoughts; development of comprehensive mental wellness plan.   Patient Goals: "I need to work on this anxiety, depression, and wanting to hurt myself."   Discharge Plan or Barriers: CSW will assist pt with development of an appropriate aftercare/discharge plan.  Reason for Continuation of Hospitalization: Depression Medication stabilization Suicidal ideation  Estimated Length of Stay: 1-7 days   Scribe for Treatment Team: Shirl Harris, LCSW 01/20/2021 1:57 PM

## 2021-01-20 NOTE — Progress Notes (Signed)
Patient calm and pleasant during assessment. Pt endorses anxiety and depression. Patient observed interacting appropriately with staff and peers on the unit. Pt compliant with medication administration per MD orders. Pt given education, support, and encouragement to be active in his treatment plan. Pt being monitored Q 15 minutes for safety per unit protocol. Pt remains safe on the unit.  °

## 2021-01-20 NOTE — Progress Notes (Signed)
[facility] MD Progress Note  01/20/2021 5:02 PM [name]  MRN:  [id number] Subjective: Follow-up for this 61 year old man with chronic mood disorder.  Patient seen in treatment team today and individually.  He continues to endorse extremely bad mood.  Quite agitated in treatment team talking loudly and at length about how depressed he is.  States multiple times that he wishes that he were dead and feels that he has nothing to live for.  Reports still having constant thoughts of self-harm.  During the day he was agitated on the unit although not aggressive towards other people.  Eventually he engaged in self-harm behaviors banging his knee against hard objects.  No complaints of any side effects of medicine.  Continues to report poor sleep. Principal Problem: Severe recurrent major depression without psychotic features ([place]) Diagnosis: Principal Problem:   Severe recurrent major depression without psychotic features ([place]) Active Problems:   Nonsuicidal self-injury ([place])   Essential hypertension   Alcohol abuse  Total Time spent with patient: 30 minutes  Past Psychiatric History: Past history of recurrent depression however no previous episodes with this much intensity.  Chronic self-harm.  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 echo 5/17: EF 60-65%, no RWMA, LV dias fxn nl, LA mildly dilated, RV sys fxn nl, PASP nl, moderate sized circumferential pericardial effusion was identified, 2.12 cm around the LV free wall, <1 cm around the RV free wall. Features were not c/w tamponade physiology   PTSD (post-traumatic stress disorder)    Witnessed brother's suicide.   Restless  leg syndrome    Syncope     Past Surgical History:  Procedure Laterality Date   CYSTOSCOPY WITH URETEROSCOPY AND STENT PLACEMENT     ESOPHAGOGASTRODUODENOSCOPY N/A 01/11/2016   Procedure: ESOPHAGOGASTRODUODENOSCOPY (EGD);  Surgeon: Wilford Corner, MD;  Location: [facility] ENDOSCOPY;  Service: Endoscopy;  Laterality: N/A;   ESOPHAGOGASTRODUODENOSCOPY N/A 04/09/2020   Procedure: ESOPHAGOGASTRODUODENOSCOPY (EGD);  Surgeon: Jonathon Bellows, MD;  Location: [facility] ENDOSCOPY;  Service: Gastroenterology;  Laterality: N/A;   INCISION AND DRAINAGE ABSCESS Left 01/02/2018   Procedure: INCISION AND DRAINAGE LEFT KNEE;  Surgeon: Earnestine Leys, MD;  Location: [facility] ORS;  Service: Orthopedics;  Laterality: Left;   JOINT REPLACEMENT Right    TKR   KNEE ARTHROSCOPY Right 06/25/2014   Procedure: ARTHROSCOPY KNEE;  Surgeon: Earnestine Leys, MD;  Location: [facility] ORS;  Service: Orthopedics;  Laterality: Right;  partial arthroscopic medial menisectomy   TOTAL KNEE ARTHROPLASTY Right 04/22/2015   Procedure: TOTAL KNEE ARTHROPLASTY;  Surgeon: Earnestine Leys, MD;  Location: [facility] ORS;  Service: Orthopedics;  Laterality: Right;   TOTAL KNEE ARTHROPLASTY Left 10/30/2017   Procedure: TOTAL KNEE ARTHROPLASTY;  Surgeon: Earnestine Leys, MD;  Location: [facility] ORS;  Service: Orthopedics;  Laterality: Left;   TOTAL KNEE REVISION Left 01/02/2018   Procedure: poly exchange of tibia and patella left knee;  Surgeon: Earnestine Leys, MD;  Location: [facility] ORS;  Service: Orthopedics;  Laterality: Left;   Family History:  Family History  Problem Relation Age of Onset   CVA Mother  deceased at age 7   Depression Brother        Died by suicide at age 38   Family Psychiatric  History: See previous.  Brother died by suicide Social History:  Social History   Substance and Sexual Activity  Alcohol Use Yes   Comment: occ     Social History   Substance and Sexual Activity  Drug Use Yes   Frequency: 5.0 times per week   Types: Marijuana    Comment: marijuana    Social History   Socioeconomic History   Marital status: Single    Spouse name: Not on file   Number of children: Not on file   Years of education: Not on file   Highest education level: Not on file  Occupational History   Not on file  Tobacco Use   Smoking status: Former    Packs/day: 0.75    Years: 20.00    Pack years: 15.00    Types: Cigarettes    Quit date: 05/16/1984    Years since quitting: 36.7   Smokeless tobacco: Never  Vaping Use   Vaping Use: Never used  Substance and Sexual Activity   Alcohol use: Yes    Comment: occ   Drug use: Yes    Frequency: 5.0 times per week    Types: Marijuana    Comment: marijuana   Sexual activity: Not on file  Other Topics Concern   Not on file  Social History Narrative   Not on file   Social Determinants of Health   Financial Resource Strain: Not on file  Food Insecurity: Not on file  Transportation Needs: Not on file  Physical Activity: Not on file  Stress: Not on file  Social Connections: Not on file   Additional Social History:    Pain Medications: See PTA Prescriptions: See PTA Over the Counter: See PTA History of alcohol / drug use?: Yes Longest period of sobriety (when/how long): Unknown Negative Consequences of Use: Financial Name of Substance 1: Alcohol                  Sleep: Poor  Appetite:  Fair  Current Medications: Current Facility-Administered Medications  Medication Dose Route Frequency Provider Last Rate Last Admin   acetaminophen (TYLENOL) tablet 650 mg  650 mg Oral Q6H PRN Waldon Merl F, NP   650 mg at 01/18/21 2024   alum & mag hydroxide-simeth (MAALOX/MYLANTA) 200-200-20 MG/5ML suspension 30 mL  30 mL Oral Q4H PRN Waldon Merl F, NP       aspirin EC tablet 325 mg  325 mg Oral Daily Waldon Merl F, NP   325 mg at 01/20/21 0804   COVID-19 mRNA Vac-TriS (Pfizer) injection 0.3 mL  0.3 mL Intramuscular ONCE-1600 Miamarie Moll T, MD       docusate sodium  (COLACE) capsule 100 mg  100 mg Oral BID Waldon Merl F, NP   100 mg at 01/20/21 0804   enalapril (VASOTEC) tablet 10 mg  10 mg Oral Daily Waldon Merl F, NP   10 mg at 01/20/21 0804   FLUoxetine (PROZAC) capsule 80 mg  80 mg Oral Daily Waldon Merl F, NP   80 mg at 01/20/21 0804   gabapentin (NEURONTIN) capsule 400 mg  400 mg Oral TID Sherlon Handing, NP   400 mg at 01/20/21 1218   haloperidol (HALDOL) tablet 5 mg  5 mg Oral Q6H PRN Karlyn Glasco, Madie Reno, MD       Or   haloperidol  lactate (HALDOL) injection 5 mg  5 mg Intramuscular Q6H PRN Abdulahi Schor T, MD   5 mg at 01/20/21 1436   hydrOXYzine (ATARAX) tablet 50 mg  50 mg Oral TID PRN Sherlon Handing, NP   50 mg at 01/20/21 1218   lithium carbonate (ESKALITH) CR tablet 450 mg  450 mg Oral Q12H Marykatherine Sherwood T, MD   450 mg at 01/20/21 1218   magnesium hydroxide (MILK OF MAGNESIA) suspension 30 mL  30 mL Oral Daily PRN Waldon Merl F, NP       multivitamin with minerals tablet 1 tablet  1 tablet Oral Daily Waldon Merl F, NP   1 tablet at 01/20/21 0804   naltrexone (DEPADE) tablet 50 mg  50 mg Oral Daily Airam Heidecker, Madie Reno, MD   50 mg at 01/20/21 0804   pantoprazole (PROTONIX) EC tablet 40 mg  40 mg Oral Daily Waldon Merl F, NP   40 mg at 01/20/21 [id number]   QUEtiapine (SEROQUEL XR) 24 hr tablet 600 mg  600 mg Oral QHS Jowell Bossi T, MD   600 mg at 01/19/21 2014    Lab Results: No results found for this or any previous visit (from the past 48 hour(s)).  Blood Alcohol level:  Lab Results  Component Value Date   ETH 121 (H) 01/17/2021   ETH <10 123XX123    Metabolic Disorder Labs: Lab Results  Component Value Date   HGBA1C 5.3 01/18/2021   MPG 105.41 01/18/2021   MPG 116.89 01/21/2019   No results found for: PROLACTIN Lab Results  Component Value Date   CHOL 202 (H) 01/18/2021   TRIG 172 (H) 01/18/2021   HDL 40 (L) 01/18/2021   CHOLHDL 5.1 01/18/2021   VLDL 34 01/18/2021   LDLCALC 128 (H) 01/18/2021    LDLCALC 98 01/07/2016    Physical Findings: AIMS:  , ,  ,  ,    CIWA:    COWS:     Musculoskeletal: Strength & Muscle Tone: within normal limits Gait & Station: normal Patient leans: N/A  Psychiatric Specialty Exam:  Presentation  General Appearance: Appropriate for Environment  Eye Contact:Fair  Speech:No data recorded Speech Volume:Normal  Handedness:No data recorded  Mood and Affect  Mood:Anxious; Depressed; Dysphoric  Affect:Blunt   Thought Process  Thought Processes:Coherent  Descriptions of Associations:Intact  Orientation:Full (Time, Place and Person)  Thought Content:WDL  History of Schizophrenia/Schizoaffective disorder:No  Duration of Psychotic Symptoms:Less than six months  Hallucinations:No data recorded Ideas of Reference:Paranoia  Suicidal Thoughts:No data recorded Homicidal Thoughts:No data recorded  Sensorium  Memory:Immediate Good  Judgment:Fair  Insight:Fair   Executive Functions  Concentration:Fair  Attention Span:Good  [place]   Psychomotor Activity  Psychomotor Activity:No data recorded  Assets  Assets:Desire for Improvement; Resilience   Sleep  Sleep:No data recorded   Physical Exam: Physical Exam Vitals and nursing note reviewed.  Constitutional:      Appearance: Normal appearance.  HENT:     Head: Normocephalic and atraumatic.     Mouth/Throat:     Pharynx: Oropharynx is clear.  Eyes:     Pupils: Pupils are equal, round, and reactive to light.  Cardiovascular:     Rate and Rhythm: Normal rate and regular rhythm.  Pulmonary:     Effort: Pulmonary effort is normal.     Breath sounds: Normal breath sounds.  Abdominal:     General: Abdomen is flat.     Palpations: Abdomen is soft.  Musculoskeletal:  General: Normal range of motion.  Skin:    General: Skin is warm and dry.       Neurological:     General: No focal deficit present.     Mental  Status: He is alert. Mental status is at baseline.  Psychiatric:        Attention and Perception: He is inattentive.        Mood and Affect: Mood normal. Affect is labile and angry.        Speech: Speech is tangential.        Behavior: Behavior is agitated.        Thought Content: Thought content includes suicidal ideation.        Cognition and Memory: Cognition is impaired.        Judgment: Judgment is impulsive.   Review of Systems  Constitutional: Negative.   HENT: Negative.    Eyes: Negative.   Respiratory: Negative.    Cardiovascular: Negative.   Gastrointestinal: Negative.   Musculoskeletal: Negative.   Skin: Negative.   Neurological: Negative.   Psychiatric/Behavioral:  Positive for depression and suicidal ideas. Negative for hallucinations and substance abuse. The patient is nervous/anxious and has insomnia.   Blood pressure 129/71, pulse 89, temperature 97.7 F (36.5 C), temperature source Oral, resp. rate 17, height 5\' 9"  (1.753 m), weight 74.4 kg, SpO2 99 %. Body mass index is 24.22 kg/m.   Treatment Plan Summary: Medication management and Plan reviewed patient's current symptoms.  He is so intensely focused on suicidal and self-harm behavior.  I suggested that we discontinue Depakote and try replacing it with lithium for mood stability.  Patient agreeable.  Start lithium 450 mg twice a day.  Continue other medicines for now.  Case was reviewed with nursing multiple times today because of his self-harm behaviors.  At this point he is not on an active one-to-one but is being kept within eyes review of the nursing staff.  Patient had a surgery consult today for the wound on his knee that he had opened up that was bleeding.  Alethia Berthold, MD 01/20/2021, 5:02 PM

## 2021-01-20 NOTE — Progress Notes (Signed)
Patient alert and awake this am. C/o intrusive suicidal ideations as well as auditory and visual hallucinations.  States, "I just cant sleep and I'm feeling so overwhelmed. I cant help these thoughts of wanting to hurt myself. I really just don't have no hope."   Rates anxiety and depression 10/10. Patient c/o pain to L knee surgical incision that he has bumped a few time and manually manipulates at the would as a method of self harming.   Patient redirected several times and told not to manipulate area.  Labs and vitals signs monitored. Patient supported emotionally and encouraged to verbalize concerns. Cont Q15 minute check for safety.

## 2021-01-20 NOTE — Progress Notes (Signed)
Four steri strips applied to L knee per order. Sharlette Dense, RN

## 2021-01-20 NOTE — Plan of Care (Signed)
°  Problem: Education: Goal: Ability to state activities that reduce stress will improve Outcome: Not Progressing   Problem: Coping: Goal: Ability to identify and develop effective coping behavior will improve Outcome: Not Progressing   Problem: Education: Goal: Utilization of techniques to improve thought processes will improve Outcome: Not Progressing Goal: Knowledge of the prescribed therapeutic regimen will improve Outcome: Not Progressing   Problem: Education: Goal: Knowledge of [facility] Education information/materials will improve Outcome: Not Progressing Goal: Emotional status will improve Outcome: Not Progressing Goal: Mental status will improve Outcome: Not Progressing Goal: Verbalization of understanding the information provided will improve Outcome: Not Progressing   Problem: Education: Goal: Ability to incorporate positive changes in behavior to improve self-esteem will improve Outcome: Not Progressing

## 2021-01-20 NOTE — Group Note (Signed)
[facility] LCSW Group Therapy Note   Group Date: 01/20/2021 Start Time: 1300 End Time: 1345   Type of Therapy/Topic:  Group Therapy:  Emotion Regulation  Participation Level:  None    Description of Group:    The purpose of this group is to assist patients in learning to regulate negative emotions and experience positive emotions. Patients will be guided to discuss ways in which they have been vulnerable to their negative emotions. These vulnerabilities will be juxtaposed with experiences of positive emotions or situations, and patients challenged to use positive emotions to combat negative ones. Special emphasis will be placed on coping with negative emotions in conflict situations, and patients will process healthy conflict resolution skills.  Therapeutic Goals: Patient will identify two positive emotions or experiences to reflect on in order to balance out negative emotions:  Patient will label two or more emotions that they find the most difficult to experience:  Patient will be able to demonstrate positive conflict resolution skills through discussion or role plays:   Summary of Patient Progress:  Patient attended group but did not participate.     Therapeutic Modalities:   Cognitive Behavioral Therapy Feelings Identification Dialectical Behavioral Therapy   Jarrett Ables, Student-Social Work

## 2021-01-20 NOTE — Progress Notes (Signed)
Patient is extremely agitated and pacing the hallway. Expresses to nurse he is agitated because he is locked out of his room. Patient attempted to process with patient but patient was resistant to nurses attempt. Physician notified of patient's request.

## 2021-01-21 DIAGNOSIS — F332 Major depressive disorder, recurrent severe without psychotic features: Secondary | ICD-10-CM | POA: Diagnosis not present

## 2021-01-21 MED ORDER — HALOPERIDOL 5 MG PO TABS
5.0000 mg | ORAL_TABLET | Freq: Once | ORAL | Status: AC
  Administered 2021-01-21: 5 mg via ORAL
  Filled 2021-01-21: qty 1

## 2021-01-21 MED ORDER — QUETIAPINE FUMARATE 25 MG PO TABS
50.0000 mg | ORAL_TABLET | Freq: Three times a day (TID) | ORAL | Status: DC
  Administered 2021-01-21 – 2021-01-28 (×21): 50 mg via ORAL
  Filled 2021-01-21 (×21): qty 2

## 2021-01-21 NOTE — Progress Notes (Signed)
Patient presents this am for medication after eating breakfast. Patient is anxious and expresses an improvement with thoughts and feeling yet continues to endorse SI with self harming thoughts as well as auditory and visual hallucinations. Patient denies having a plan while in the hospital. Denies homicidal ideations. Rates depression a 6/10, anxiety a 7/10 and endorses pain to left knee (see pain assessment).   Labs and vital signs monitored. Patient supported emotionally and encouraged to verbalize concerns. Encouraged to be active on milieu this day.  No adverse reaction to medication noted. Cont q15 minute check for safety.

## 2021-01-21 NOTE — Progress Notes (Signed)
Recreation Therapy Notes  INPATIENT RECREATION THERAPY ASSESSMENT  [name] MRN: [id number] DOB: May 20, 1960 Today's Date: 01/21/2021       Information Obtained From: Patient  Able to Participate in Assessment/Interview: Yes  Patient Presentation: Responsive  Reason for Admission (Per Patient): Active Symptoms, Suicidal Ideation, Self-injurious Behavior  Patient Stressors:    Coping Skills:   Isolation, Avoidance, Substance Abuse, Self-Injury  Leisure Interests (2+):  Exercise - Walking, Art - Draw (Time with dog)  Frequency of Recreation/Participation: Monthly  Awareness of Community Resources:  Yes  Community Resources:  Other (Comment) ([facility])  Current Use: Yes  If no, Barriers?:    Expressed Interest in [employer] Information: Yes  County of Residence:  [place]  Patient Main Form of Transportation: Therapist, music  Patient Strengths:  N/A  Patient Identified Areas of Improvement:  My anxiety and depression  Patient Goal for Hospitalization:  Stop wanting to hurt myself.  Current SI (including self-harm):  Yes (No plan)  Current HI:  No  Current AVH: Yes  Staff Intervention Plan: Group Attendance, Collaborate with Interdisciplinary Treatment Team  Consent to Intern Participation: N/A  Shaine Mount 01/21/2021, 12:24 PM

## 2021-01-21 NOTE — Plan of Care (Signed)
°  Problem: Education: Goal: Ability to state activities that reduce stress will improve Outcome: Progressing   Problem: Coping: Goal: Ability to identify and develop effective coping behavior will improve Outcome: Progressing   Problem: Education: Goal: Utilization of techniques to improve thought processes will improve Outcome: Progressing Goal: Knowledge of the prescribed therapeutic regimen will improve Outcome: Progressing   Problem: Education: Goal: Knowledge of [facility] Education information/materials will improve Outcome: Progressing Goal: Emotional status will improve Outcome: Progressing Goal: Mental status will improve Outcome: Progressing Goal: Verbalization of understanding the information provided will improve Outcome: Progressing   Problem: Education: Goal: Knowledge of [place] [facility] Education information/materials will improve Outcome: Progressing Goal: Emotional status will improve Outcome: Progressing Goal: Mental status will improve Outcome: Progressing Goal: Verbalization of understanding the information provided will improve Outcome: Progressing   Problem: Education: Goal: Ability to incorporate positive changes in behavior to improve self-esteem will improve Outcome: Progressing   Problem: Health Behavior/Discharge Planning: Goal: Ability to remain free from injury will improve Outcome: Progressing

## 2021-01-21 NOTE — Progress Notes (Signed)
Recreation Therapy Notes  Date: 01/21/2021  Time: 10:40 am     Location: Courtyard   Behavioral response: N/A   Intervention Topic: Social skills   Discussion/Intervention: Patient did not attend group.  Clinical Observations/Feedback:  Patient did not attend group.   Emiya Loomer LRT/CTRS         Kayson Tasker 01/21/2021 12:14 PM

## 2021-01-21 NOTE — Progress Notes (Signed)
[facility] MD Progress Note  01/21/2021 11:24 AM [name]  MRN:  [id number] Subjective: Follow-up for this 61 year old man with depression as well as chronic self-injury mood instability atypical hallucinations.  Patient seen this morning.  At least so far he is less agitated than yesterday and behaving more appropriately.  He has not reinjured his knee since yesterday.  He still says that he hears things and sees things but laying in bed he is not acting too much like he is responding to internal stimuli.  No active intent to kill himself but still presents himself to me as distressed and feeling very bad.  No complaints of any new side effects. Principal Problem: Severe recurrent major depression without psychotic features ([place]) Diagnosis: Principal Problem:   Severe recurrent major depression without psychotic features ([place]) Active Problems:   Nonsuicidal self-injury ([place])   Essential hypertension   Alcohol abuse  Total Time spent with patient: 30 minutes  Past Psychiatric History: History of mood instability chronic depression and anxiety worsened by recent homelessness  Past Medical History:  Past Medical History:  Diagnosis Date   Anxiety    Arthritis    knees and hands   Bipolar disorder ([place])    Depression    GERD (gastroesophageal reflux disease)    Hepatitis    HEP "C"   History of kidney stones    Hypertension    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 echo 5/17: EF 60-65%, no RWMA, LV dias fxn nl, LA mildly dilated, RV sys fxn nl, PASP nl, moderate sized circumferential pericardial effusion was identified, 2.12 cm around the LV free wall, <1 cm around the RV free wall. Features were not c/w tamponade physiology   PTSD (post-traumatic stress disorder)    Witnessed brother's suicide.   Restless leg syndrome    Syncope     Past Surgical History:  Procedure Laterality Date   CYSTOSCOPY WITH URETEROSCOPY AND  STENT PLACEMENT     ESOPHAGOGASTRODUODENOSCOPY N/A 01/11/2016   Procedure: ESOPHAGOGASTRODUODENOSCOPY (EGD);  Surgeon: Wilford Corner, MD;  Location: [facility] ENDOSCOPY;  Service: Endoscopy;  Laterality: N/A;   ESOPHAGOGASTRODUODENOSCOPY N/A 04/09/2020   Procedure: ESOPHAGOGASTRODUODENOSCOPY (EGD);  Surgeon: Jonathon Bellows, MD;  Location: [facility] ENDOSCOPY;  Service: Gastroenterology;  Laterality: N/A;   INCISION AND DRAINAGE ABSCESS Left 01/02/2018   Procedure: INCISION AND DRAINAGE LEFT KNEE;  Surgeon: Earnestine Leys, MD;  Location: [facility] ORS;  Service: Orthopedics;  Laterality: Left;   JOINT REPLACEMENT Right    TKR   KNEE ARTHROSCOPY Right 06/25/2014   Procedure: ARTHROSCOPY KNEE;  Surgeon: Earnestine Leys, MD;  Location: [facility] ORS;  Service: Orthopedics;  Laterality: Right;  partial arthroscopic medial menisectomy   TOTAL KNEE ARTHROPLASTY Right 04/22/2015   Procedure: TOTAL KNEE ARTHROPLASTY;  Surgeon: Earnestine Leys, MD;  Location: [facility] ORS;  Service: Orthopedics;  Laterality: Right;   TOTAL KNEE ARTHROPLASTY Left 10/30/2017   Procedure: TOTAL KNEE ARTHROPLASTY;  Surgeon: Earnestine Leys, MD;  Location: [facility] ORS;  Service: Orthopedics;  Laterality: Left;   TOTAL KNEE REVISION Left 01/02/2018   Procedure: poly exchange of tibia and patella left knee;  Surgeon: Earnestine Leys, MD;  Location: [facility] ORS;  Service: Orthopedics;  Laterality: Left;   Family History:  Family History  Problem Relation Age of Onset   CVA Mother        deceased at age 49   Depression Brother        Died by  suicide at age 43   Family Psychiatric  History: See previous Social History:  Social History   Substance and Sexual Activity  Alcohol Use Yes   Comment: occ     Social History   Substance and Sexual Activity  Drug Use Yes   Frequency: 5.0 times per week   Types: Marijuana   Comment: marijuana    Social History   Socioeconomic History   Marital status: Single    Spouse name: Not on file   Number of children:  Not on file   Years of education: Not on file   Highest education level: Not on file  Occupational History   Not on file  Tobacco Use   Smoking status: Former    Packs/day: 0.75    Years: 20.00    Pack years: 15.00    Types: Cigarettes    Quit date: 05/16/1984    Years since quitting: 36.7   Smokeless tobacco: Never  Vaping Use   Vaping Use: Never used  Substance and Sexual Activity   Alcohol use: Yes    Comment: occ   Drug use: Yes    Frequency: 5.0 times per week    Types: Marijuana    Comment: marijuana   Sexual activity: Not on file  Other Topics Concern   Not on file  Social History Narrative   Not on file   Social Determinants of Health   Financial Resource Strain: Not on file  Food Insecurity: Not on file  Transportation Needs: Not on file  Physical Activity: Not on file  Stress: Not on file  Social Connections: Not on file   Additional Social History:    Pain Medications: See PTA Prescriptions: See PTA Over the Counter: See PTA History of alcohol / drug use?: Yes Longest period of sobriety (when/how long): Unknown Negative Consequences of Use: Financial Name of Substance 1: Alcohol                  Sleep: Fair  Appetite:  Fair  Current Medications: Current Facility-Administered Medications  Medication Dose Route Frequency Provider Last Rate Last Admin   acetaminophen (TYLENOL) tablet 650 mg  650 mg Oral Q6H PRN Waldon Merl F, NP   650 mg at 01/21/21 0822   alum & mag hydroxide-simeth (MAALOX/MYLANTA) 200-200-20 MG/5ML suspension 30 mL  30 mL Oral Q4H PRN Waldon Merl F, NP       aspirin EC tablet 325 mg  325 mg Oral Daily Waldon Merl F, NP   325 mg at 01/21/21 [id number]   COVID-19 mRNA Vac-TriS (Pfizer) injection 0.3 mL  0.3 mL Intramuscular ONCE-1600 Jesus Poplin T, MD       docusate sodium (COLACE) capsule 100 mg  100 mg Oral BID Waldon Merl F, NP   100 mg at 01/21/21 [id number]   enalapril (VASOTEC) tablet 10 mg  10 mg Oral Daily  Waldon Merl F, NP   10 mg at 01/21/21 0824   FLUoxetine (PROZAC) capsule 80 mg  80 mg Oral Daily Waldon Merl F, NP   80 mg at 01/21/21 [id number]   gabapentin (NEURONTIN) capsule 400 mg  400 mg Oral TID Sherlon Handing, NP   400 mg at 01/21/21 [id number]   haloperidol (HALDOL) tablet 5 mg  5 mg Oral Q6H PRN Inioluwa Boulay, Madie Reno, MD       Or   haloperidol lactate (HALDOL) injection 5 mg  5 mg Intramuscular Q6H PRN Rateel Beldin, Madie Reno, MD   5 mg at 01/20/21 1436  hydrOXYzine (ATARAX) tablet 50 mg  50 mg Oral TID PRN Sherlon Handing, NP   50 mg at 01/21/21 0645   lithium carbonate (ESKALITH) CR tablet 450 mg  450 mg Oral Q12H Jamaurion Slemmer, Madie Reno, MD   450 mg at 01/21/21 [id number]   magnesium hydroxide (MILK OF MAGNESIA) suspension 30 mL  30 mL Oral Daily PRN Sherlon Handing, NP       multivitamin with minerals tablet 1 tablet  1 tablet Oral Daily Waldon Merl F, NP   1 tablet at 01/21/21 [id number]   naltrexone (DEPADE) tablet 50 mg  50 mg Oral Daily Cadarius Nevares, Madie Reno, MD   50 mg at 01/21/21 0823   pantoprazole (PROTONIX) EC tablet 40 mg  40 mg Oral Daily Waldon Merl F, NP   40 mg at 01/21/21 [id number]   QUEtiapine (SEROQUEL XR) 24 hr tablet 600 mg  600 mg Oral QHS Brylen Wagar, Madie Reno, MD   600 mg at 01/20/21 2114    Lab Results: No results found for this or any previous visit (from the past 48 hour(s)).  Blood Alcohol level:  Lab Results  Component Value Date   ETH 121 (H) 01/17/2021   ETH <10 [id number]    Metabolic Disorder Labs: Lab Results  Component Value Date   HGBA1C 5.3 01/18/2021   MPG 105.41 01/18/2021   MPG 116.89 01/21/2019   No results found for: PROLACTIN Lab Results  Component Value Date   CHOL 202 (H) 01/18/2021   TRIG 172 (H) 01/18/2021   HDL 40 (L) 01/18/2021   CHOLHDL 5.1 01/18/2021   VLDL 34 01/18/2021   LDLCALC 128 (H) 01/18/2021   LDLCALC 98 01/07/2016    Physical Findings: AIMS:  , ,  ,  ,    CIWA:    COWS:     Musculoskeletal: Strength & Muscle Tone: within normal  limits Gait & Station: normal Patient leans: N/A  Psychiatric Specialty Exam:  Presentation  General Appearance: Appropriate for Environment  Eye Contact:Fair  Speech:No data recorded Speech Volume:Normal  Handedness:No data recorded  Mood and Affect  Mood:Anxious; Depressed; Dysphoric  Affect:Blunt   Thought Process  Thought Processes:Coherent  Descriptions of Associations:Intact  Orientation:Full (Time, Place and Person)  Thought Content:WDL  History of Schizophrenia/Schizoaffective disorder:No  Duration of Psychotic Symptoms:Less than six months  Hallucinations:No data recorded Ideas of Reference:Paranoia  Suicidal Thoughts:No data recorded Homicidal Thoughts:No data recorded  Sensorium  Memory:Immediate Good  Judgment:Fair  Insight:Fair   Executive Functions  Concentration:Fair  Attention Span:Good  [place]   Psychomotor Activity  Psychomotor Activity:No data recorded  Assets  Assets:Desire for Improvement; Resilience   Sleep  Sleep:No data recorded   Physical Exam: Physical Exam Vitals and nursing note reviewed.  Constitutional:      Appearance: Normal appearance.  HENT:     Head: Normocephalic and atraumatic.     Mouth/Throat:     Pharynx: Oropharynx is clear.  Eyes:     Pupils: Pupils are equal, round, and reactive to light.  Cardiovascular:     Rate and Rhythm: Normal rate and regular rhythm.  Pulmonary:     Effort: Pulmonary effort is normal.     Breath sounds: Normal breath sounds.  Abdominal:     General: Abdomen is flat.     Palpations: Abdomen is soft.  Musculoskeletal:        General: Normal range of motion.  Skin:    General: Skin is warm and dry.  Neurological:  General: No focal deficit present.     Mental Status: He is alert. Mental status is at baseline.  Psychiatric:        Attention and Perception: He perceives auditory hallucinations.        Mood and  Affect: Mood is anxious.        Speech: Speech normal.        Behavior: Behavior is withdrawn.        Thought Content: Thought content normal.   Review of Systems  Constitutional: Negative.   HENT: Negative.    Eyes: Negative.   Respiratory: Negative.    Cardiovascular: Negative.   Gastrointestinal: Negative.   Musculoskeletal: Negative.   Skin: Negative.   Neurological: Negative.   Psychiatric/Behavioral:  Positive for depression and hallucinations. Negative for suicidal ideas. The patient is nervous/anxious.   Blood pressure 113/85, pulse 86, temperature 97.7 F (36.5 C), temperature source Oral, resp. rate 17, height 5\' 9"  (1.753 m), weight 74.4 kg, SpO2 100 %. Body mass index is 24.22 kg/m.   Treatment Plan Summary: Medication management and Plan no change to medication management.  Just started lithium yesterday and increase Seroquel which will need time to work.  Encouraged patient when he can to engage in groups and come out of his room.  Praised him for not having reinjured himself.  As needed's are in place for agitation if needed.  Alethia Berthold, MD 01/21/2021, 11:24 AM

## 2021-01-21 NOTE — Group Note (Signed)
[facility] LCSW Group Therapy Note   Group Date: 01/21/2021 Start Time: 1300 End Time: 1400   Type of Therapy/Topic:  Group Therapy:  Balance in Life  Participation Level:  Active   Description of Group:    This group will address the concept of balance and how it feels and looks when one is unbalanced. Patients will be encouraged to process areas in their lives that are out of balance, and identify reasons for remaining unbalanced. Facilitators will guide patients utilizing problem- solving interventions to address and correct the stressor making their life unbalanced. Understanding and applying boundaries will be explored and addressed for obtaining  and maintaining a balanced life. Patients will be encouraged to explore ways to assertively make their unbalanced needs known to significant others in their lives, using other group members and facilitator for support and feedback.  Therapeutic Goals: Patient will identify two or more emotions or situations they have that consume much of in their lives. Patient will identify signs/triggers that life has become out of balance:  Patient will identify two ways to set boundaries in order to achieve balance in their lives:  Patient will demonstrate ability to communicate their needs through discussion and/or role plays  Summary of Patient Progress: Patient was present for the entirety of the group session. Patient was an active listener and participated in the topic of discussion, provided helpful advice to others, and added nuance to topic of conversation. Patient states that he would like to stay emotionally balanced. States that he has emotional highs and lows; group discussed consequences of each.     Therapeutic Modalities:   Cognitive Behavioral Therapy Solution-Focused Therapy Assertiveness Training   Jacqulyn Ducking [place], [place]

## 2021-01-21 NOTE — Progress Notes (Signed)
Recreation Therapy Notes  INPATIENT RECREATION TR PLAN  [name] MRN: [id number] DOB: February 19, 1960 Today's Date: 01/21/2021  Rec Therapy Plan Is patient appropriate for Therapeutic Recreation?: Yes Treatment times per week: at least 3 Estimated Length of Stay: 5-7 days TR Treatment/Interventions: Group participation (Comment)  Discharge Criteria Pt will be discharged from therapy if:: Discharged Treatment plan/goals/alternatives discussed and agreed upon by:: Patient/family  Discharge Summary     [name] 01/21/2021, 12:25 PM

## 2021-01-21 NOTE — Plan of Care (Signed)
°  Problem: Coping: Goal: Ability to identify and develop effective coping behavior will improve Outcome: Progressing   Problem: Education: Goal: Utilization of techniques to improve thought processes will improve Outcome: Progressing   Problem: Education: Goal: Emotional status will improve Outcome: Progressing Goal: Mental status will improve Outcome: Progressing Goal: Verbalization of understanding the information provided will improve Outcome: Progressing

## 2021-01-21 NOTE — Progress Notes (Signed)
Patient reports feeling better today than yesterday but still experiencing auditory command hallucinations. States, "The more I try to fight it the stronger the voices become. They keep telling me to hurt myself." Patient encouraged to use coping skills and MD notified. New order for 1x haldol received along with Quetiapine 25 mg TID. 1st dose scheduled for 1700.  Will cont to monitor for safety.

## 2021-01-22 DIAGNOSIS — F332 Major depressive disorder, recurrent severe without psychotic features: Secondary | ICD-10-CM | POA: Diagnosis not present

## 2021-01-22 MED ORDER — DOCUSATE SODIUM 100 MG PO CAPS: 100.0000 mg | ORAL_CAPSULE | Freq: Two times a day (BID) | ORAL | Status: DC

## 2021-01-22 MED ORDER — SENNOSIDES-DOCUSATE SODIUM 8.6-50 MG PO TABS
2.0000 | ORAL_TABLET | Freq: Every day | ORAL | Status: DC | PRN
  Administered 2021-01-22 – 2021-01-23 (×2): 2 via ORAL
  Filled 2021-01-22 (×2): qty 2

## 2021-01-22 NOTE — Group Note (Signed)
[facility] LCSW Group Therapy Note   Group Date: 01/22/2021 Start Time: 1300 End Time: 1400  Type of Therapy and Topic:  Group Therapy:  Feelings around Relapse and Recovery  Participation Level:  Active   Mood: Pleasant   Description of Group:   Patients in this group will discuss emotions they experience before and after a relapse. They will process how experiencing these feelings, or avoidance of experiencing them, relates to having a relapse. Facilitator will guide patients to explore emotions they have related to recovery. Patients will be encouraged to process which emotions are more powerful. They will be guided to discuss the emotional reaction significant others in their lives may have to patients relapse or recovery. Patients will be assisted in exploring ways to respond to the emotions of others without this contributing to a relapse.  Therapeutic Goals: Patient will identify two or more emotions that lead to relapse for them:  Patient will identify two emotions that result when they relapse:  Patient will identify two emotions related to recovery:  Patient will demonstrate ability to communicate their needs through discussion and/or role plays.   Summary of Patient Progress:  Patient was present for the entirety of the group session. Patient was an active listener and participated in the topic of discussion, provided helpful advice to others, and added nuance to topic of conversation. Patient shared that his emotions can be triggering to his mental health.    Therapeutic Modalities:   Cognitive Behavioral Therapy Solution-Focused Therapy Assertiveness Training Relapse Prevention Therapy   Corky Crafts, [place]

## 2021-01-22 NOTE — Progress Notes (Signed)
Recreation Therapy Notes   Date: 01/22/2021  Time: 10:30am    Location: Craft room   Behavioral response: Appropriate  Intervention Topic:  Time Management    Discussion/Intervention:  Group content today was focused on time management. The group defined time management and identified healthy ways to manage time. Individuals expressed how much of the 24 hours they use in a day. Patients expressed how much time they use just for themselves personally. The group expressed how they have managed their time in the past. Individuals participated in the intervention Managing Life where they had a chance to see how much of the 24 hours they use and where it goes.  Clinical Observations/Feedback: Patient came to group and defined time management as organizing things you are supposed to be doing. Individual was social with peers and staff while participating in the intervention.    Lauralei Clouse LRT/CTRS         Emri Sample 01/22/2021 11:46 AM

## 2021-01-22 NOTE — Progress Notes (Incomplete)
Patient pleasant and cooperative. Medication compliant. Appropriate with staff and peers. Isolative to self and room most of shift. Sleeping well at present.  Encouragement and support provided. Safety checks maintained. Medications given as prescribed. Pt receptive and remains safe on unit with q 15 min checks.

## 2021-01-22 NOTE — Plan of Care (Signed)
Patient stated that he does not have any appetite because he is constipated. MOM and prune juice given. Patient stated that he is hearing voices telling him to hurt himself. Patient contracts for safety. Denies SI and HI at this time. Patient appropriate with staff & peers. Attended groups. Patient states that when is around people his anxiety goes high.Vistaril x 1 given.Support and encouragement given.

## 2021-01-22 NOTE — Progress Notes (Signed)
[facility] MD Progress Note  01/22/2021 2:25 PM [name]  MRN:  [id number] Subjective: Follow-up for this 61 year old man with depression and anxiety self-injury.  Has not engaged in any more self-injury in the last day.  Continues however to complain of frequent auditory and visual hallucinations and depressed mood.  Still reports not sleeping well although he spends much of the time in bed.  Complains of constipation Principal Problem: Severe recurrent major depression without psychotic features ([place]) Diagnosis: Principal Problem:   Severe recurrent major depression without psychotic features ([place]) Active Problems:   Nonsuicidal self-injury ([place])   Essential hypertension   Alcohol abuse  Total Time spent with patient: 30 minutes  Past Psychiatric History: Past history of chronic depression made worse by recent losses  Past Medical History:  Past Medical History:  Diagnosis Date   Anxiety    Arthritis    knees and hands   Bipolar disorder ([place])    Depression    GERD (gastroesophageal reflux disease)    Hepatitis    HEP "C"   History of kidney stones    Hypertension    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 echo 5/17: EF 60-65%, no RWMA, LV dias fxn nl, LA mildly dilated, RV sys fxn nl, PASP nl, moderate sized circumferential pericardial effusion was identified, 2.12 cm around the LV free wall, <1 cm around the RV free wall. Features were not c/w tamponade physiology   PTSD (post-traumatic stress disorder)    Witnessed brother's suicide.   Restless leg syndrome    Syncope     Past Surgical History:  Procedure Laterality Date   CYSTOSCOPY WITH URETEROSCOPY AND STENT PLACEMENT     ESOPHAGOGASTRODUODENOSCOPY N/A 01/11/2016   Procedure: ESOPHAGOGASTRODUODENOSCOPY (EGD);  Surgeon: Wilford Corner, MD;  Location: [facility] ENDOSCOPY;  Service: Endoscopy;  Laterality: N/A;   ESOPHAGOGASTRODUODENOSCOPY N/A 04/09/2020    Procedure: ESOPHAGOGASTRODUODENOSCOPY (EGD);  Surgeon: Jonathon Bellows, MD;  Location: [facility] ENDOSCOPY;  Service: Gastroenterology;  Laterality: N/A;   INCISION AND DRAINAGE ABSCESS Left 01/02/2018   Procedure: INCISION AND DRAINAGE LEFT KNEE;  Surgeon: Earnestine Leys, MD;  Location: [facility] ORS;  Service: Orthopedics;  Laterality: Left;   JOINT REPLACEMENT Right    TKR   KNEE ARTHROSCOPY Right 06/25/2014   Procedure: ARTHROSCOPY KNEE;  Surgeon: Earnestine Leys, MD;  Location: [facility] ORS;  Service: Orthopedics;  Laterality: Right;  partial arthroscopic medial menisectomy   TOTAL KNEE ARTHROPLASTY Right 04/22/2015   Procedure: TOTAL KNEE ARTHROPLASTY;  Surgeon: Earnestine Leys, MD;  Location: [facility] ORS;  Service: Orthopedics;  Laterality: Right;   TOTAL KNEE ARTHROPLASTY Left 10/30/2017   Procedure: TOTAL KNEE ARTHROPLASTY;  Surgeon: Earnestine Leys, MD;  Location: [facility] ORS;  Service: Orthopedics;  Laterality: Left;   TOTAL KNEE REVISION Left 01/02/2018   Procedure: poly exchange of tibia and patella left knee;  Surgeon: Earnestine Leys, MD;  Location: [facility] ORS;  Service: Orthopedics;  Laterality: Left;   Family History:  Family History  Problem Relation Age of Onset   CVA Mother        deceased at age 46   Depression Brother        Died by suicide at age 79   Family Psychiatric  History: See previous Social History:  Social History   Substance and Sexual Activity  Alcohol Use Yes   Comment: occ     Social History   Substance and Sexual Activity  Drug Use Yes  Frequency: 5.0 times per week   Types: Marijuana   Comment: marijuana    Social History   Socioeconomic History   Marital status: Single    Spouse name: Not on file   Number of children: Not on file   Years of education: Not on file   Highest education level: Not on file  Occupational History   Not on file  Tobacco Use   Smoking status: Former    Packs/day: 0.75    Years: 20.00    Pack years: 15.00    Types: Cigarettes     Quit date: 05/16/1984    Years since quitting: 36.7   Smokeless tobacco: Never  Vaping Use   Vaping Use: Never used  Substance and Sexual Activity   Alcohol use: Yes    Comment: occ   Drug use: Yes    Frequency: 5.0 times per week    Types: Marijuana    Comment: marijuana   Sexual activity: Not on file  Other Topics Concern   Not on file  Social History Narrative   Not on file   Social Determinants of Health   Financial Resource Strain: Not on file  Food Insecurity: Not on file  Transportation Needs: Not on file  Physical Activity: Not on file  Stress: Not on file  Social Connections: Not on file   Additional Social History:    Pain Medications: See PTA Prescriptions: See PTA Over the Counter: See PTA History of alcohol / drug use?: Yes Longest period of sobriety (when/how long): Unknown Negative Consequences of Use: Financial Name of Substance 1: Alcohol                  Sleep: Fair  Appetite:  Fair  Current Medications: Current Facility-Administered Medications  Medication Dose Route Frequency Provider Last Rate Last Admin   acetaminophen (TYLENOL) tablet 650 mg  650 mg Oral Q6H PRN Waldon Merl F, NP   650 mg at 01/21/21 0822   alum & mag hydroxide-simeth (MAALOX/MYLANTA) 200-200-20 MG/5ML suspension 30 mL  30 mL Oral Q4H PRN Waldon Merl F, NP       aspirin EC tablet 325 mg  325 mg Oral Daily Waldon Merl F, NP   325 mg at 01/22/21 1217   docusate sodium (COLACE) capsule 100 mg  100 mg Oral BID Waldon Merl F, NP   100 mg at 01/22/21 0802   docusate sodium (COLACE) capsule 100 mg  100 mg Oral BID Missey Hasley T, MD       enalapril (VASOTEC) tablet 10 mg  10 mg Oral Daily Waldon Merl F, NP   10 mg at 01/22/21 0802   FLUoxetine (PROZAC) capsule 80 mg  80 mg Oral Daily Waldon Merl F, NP   80 mg at 01/22/21 0802   gabapentin (NEURONTIN) capsule 400 mg  400 mg Oral TID Waldon Merl F, NP   400 mg at 01/22/21 1217   haloperidol  (HALDOL) tablet 5 mg  5 mg Oral Q6H PRN Marshall Kampf, Madie Reno, MD   5 mg at 01/21/21 2110   Or   haloperidol lactate (HALDOL) injection 5 mg  5 mg Intramuscular Q6H PRN Rutger Salton, Madie Reno, MD   5 mg at 01/20/21 1436   hydrOXYzine (ATARAX) tablet 50 mg  50 mg Oral TID PRN Sherlon Handing, NP   50 mg at 01/22/21 1217   lithium carbonate (ESKALITH) CR tablet 450 mg  450 mg Oral Q12H Daria Mcmeekin, Madie Reno, MD   450 mg at  01/22/21 0802   magnesium hydroxide (MILK OF MAGNESIA) suspension 30 mL  30 mL Oral Daily PRN Waldon Merl F, NP   30 mL at 01/22/21 0803   multivitamin with minerals tablet 1 tablet  1 tablet Oral Daily Waldon Merl F, NP   1 tablet at 01/22/21 0801   naltrexone (DEPADE) tablet 50 mg  50 mg Oral Daily Albirda Shiel, Madie Reno, MD   50 mg at 01/22/21 0801   pantoprazole (PROTONIX) EC tablet 40 mg  40 mg Oral Daily Waldon Merl F, NP   40 mg at 01/22/21 [id number]   QUEtiapine (SEROQUEL XR) 24 hr tablet 600 mg  600 mg Oral QHS Kolt Mcwhirter T, MD   600 mg at 01/21/21 2109   QUEtiapine (SEROQUEL) tablet 50 mg  50 mg Oral TID Mardy Hoppe, Madie Reno, MD   50 mg at 01/22/21 1217   senna-docusate (Senokot-S) tablet 2 tablet  2 tablet Oral Daily PRN Kamora Vossler, Madie Reno, MD        Lab Results: No results found for this or any previous visit (from the past 48 hour(s)).  Blood Alcohol level:  Lab Results  Component Value Date   ETH 121 (H) 01/17/2021   ETH <10 123XX123    Metabolic Disorder Labs: Lab Results  Component Value Date   HGBA1C 5.3 01/18/2021   MPG 105.41 01/18/2021   MPG 116.89 01/21/2019   No results found for: PROLACTIN Lab Results  Component Value Date   CHOL 202 (H) 01/18/2021   TRIG 172 (H) 01/18/2021   HDL 40 (L) 01/18/2021   CHOLHDL 5.1 01/18/2021   VLDL 34 01/18/2021   LDLCALC 128 (H) 01/18/2021   LDLCALC 98 01/07/2016    Physical Findings: AIMS:  , ,  ,  ,    CIWA:    COWS:     Musculoskeletal: Strength & Muscle Tone: within normal limits Gait & Station:  normal Patient leans: N/A  Psychiatric Specialty Exam:  Presentation  General Appearance: Appropriate for Environment  Eye Contact:Fair  Speech:No data recorded Speech Volume:Normal  Handedness:No data recorded  Mood and Affect  Mood:Anxious; Depressed; Dysphoric  Affect:Blunt   Thought Process  Thought Processes:Coherent  Descriptions of Associations:Intact  Orientation:Full (Time, Place and Person)  Thought Content:WDL  History of Schizophrenia/Schizoaffective disorder:No  Duration of Psychotic Symptoms:Less than six months  Hallucinations:No data recorded Ideas of Reference:Paranoia  Suicidal Thoughts:No data recorded Homicidal Thoughts:No data recorded  Sensorium  Memory:Immediate Good  Judgment:Fair  Insight:Fair   Executive Functions  Concentration:Fair  Attention Span:Good  [place]   Psychomotor Activity  Psychomotor Activity:No data recorded  Assets  Assets:Desire for Improvement; Resilience   Sleep  Sleep:No data recorded   Physical Exam: Physical Exam Vitals and nursing note reviewed.  Constitutional:      Appearance: Normal appearance.  HENT:     Head: Normocephalic and atraumatic.     Mouth/Throat:     Pharynx: Oropharynx is clear.  Eyes:     Pupils: Pupils are equal, round, and reactive to light.  Cardiovascular:     Rate and Rhythm: Normal rate and regular rhythm.  Pulmonary:     Effort: Pulmonary effort is normal.     Breath sounds: Normal breath sounds.  Abdominal:     General: Abdomen is flat.     Palpations: Abdomen is soft.  Musculoskeletal:        General: Normal range of motion.  Skin:    General: Skin is warm and dry.  Neurological:     General: No focal deficit present.     Mental Status: He is alert. Mental status is at baseline.  Psychiatric:        Attention and Perception: Attention normal.        Mood and Affect: Mood is depressed. Affect is blunt.         Speech: Speech is delayed.        Behavior: Behavior is cooperative.        Thought Content: Thought content includes suicidal ideation. Thought content does not include suicidal plan.        Cognition and Memory: Cognition normal.        Judgment: Judgment is impulsive.   Review of Systems  Constitutional: Negative.   HENT: Negative.    Eyes: Negative.   Respiratory: Negative.    Cardiovascular: Negative.   Gastrointestinal: Negative.   Musculoskeletal: Negative.   Skin: Negative.   Neurological: Negative.   Psychiatric/Behavioral:  Positive for depression and hallucinations. The patient is nervous/anxious.   Blood pressure (!) 136/93, pulse (!) 108, temperature 97.7 F (36.5 C), temperature source Oral, resp. rate 17, height 5\' 9"  (1.753 m), weight 74.4 kg, SpO2 98 %. Body mass index is 24.22 kg/m.   Treatment Plan Summary: Medication management and Plan no change to psychiatric medicine.  Already on Seroquel as well as lithium plus the naltrexone.  Encourage group attendance and getting out of bed.  Encouraged work with Education officer, museum about finding options for places to live.  Added Senokot for constipation as well as Colace  Alethia Berthold, MD 01/22/2021, 2:25 PM

## 2021-01-23 DIAGNOSIS — F332 Major depressive disorder, recurrent severe without psychotic features: Secondary | ICD-10-CM | POA: Diagnosis not present

## 2021-01-23 NOTE — Progress Notes (Signed)
Patient was anxious on approach, he was visible in the milieu. He denies SI/HI & AVH and agrees to come to staff if he has thoughts of wanting to hurt himself.  He remains depressed and reports that he has nothing to really live for. He is currently resting in bed quietly.

## 2021-01-23 NOTE — Progress Notes (Signed)
Pt still constipated and he has gotten prn medications for constipation, he might need an enema.

## 2021-01-23 NOTE — Progress Notes (Incomplete)
Pt complained of anxiety 10/10 and he did not know what was driving his anxiety. See mar. He has been pacing the hall, restless and agitated.

## 2021-01-23 NOTE — Progress Notes (Signed)
[facility] MD Progress Note  01/23/2021 10:29 AM [name]  MRN:  [id number] Subjective: Follow-up patient with severe depression and anxiety and self-injury.  He acknowledges that he slept better last night.  We also discussed how he has managed to go for over 2 days without injuring himself.  He emphasizes to me that he still has thoughts about it but admits he has not acted on it.  Has gone out of his room a little bit to the day room.  Seemed a little calmer and less angry and hostile today.  Generally tolerating medicine Principal Problem: Severe recurrent major depression without psychotic features ([place]) Diagnosis: Principal Problem:   Severe recurrent major depression without psychotic features ([place]) Active Problems:   Nonsuicidal self-injury ([place])   Essential hypertension   Alcohol abuse  Total Time spent with patient: 30 minutes  Past Psychiatric History: Past history of chronic mood instability  Past Medical History:  Past Medical History:  Diagnosis Date   Anxiety    Arthritis    knees and hands   Bipolar disorder ([place])    Depression    GERD (gastroesophageal reflux disease)    Hepatitis    HEP "C"   History of kidney stones    Hypertension    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 echo 5/17: EF 60-65%, no RWMA, LV dias fxn nl, LA mildly dilated, RV sys fxn nl, PASP nl, moderate sized circumferential pericardial effusion was identified, 2.12 cm around the LV free wall, <1 cm around the RV free wall. Features were not c/w tamponade physiology   PTSD (post-traumatic stress disorder)    Witnessed brother's suicide.   Restless leg syndrome    Syncope     Past Surgical History:  Procedure Laterality Date   CYSTOSCOPY WITH URETEROSCOPY AND STENT PLACEMENT     ESOPHAGOGASTRODUODENOSCOPY N/A 01/11/2016   Procedure: ESOPHAGOGASTRODUODENOSCOPY (EGD);  Surgeon: Wilford Corner, MD;  Location: [facility] ENDOSCOPY;   Service: Endoscopy;  Laterality: N/A;   ESOPHAGOGASTRODUODENOSCOPY N/A 04/09/2020   Procedure: ESOPHAGOGASTRODUODENOSCOPY (EGD);  Surgeon: Jonathon Bellows, MD;  Location: [facility] ENDOSCOPY;  Service: Gastroenterology;  Laterality: N/A;   INCISION AND DRAINAGE ABSCESS Left 01/02/2018   Procedure: INCISION AND DRAINAGE LEFT KNEE;  Surgeon: Earnestine Leys, MD;  Location: [facility] ORS;  Service: Orthopedics;  Laterality: Left;   JOINT REPLACEMENT Right    TKR   KNEE ARTHROSCOPY Right 06/25/2014   Procedure: ARTHROSCOPY KNEE;  Surgeon: Earnestine Leys, MD;  Location: [facility] ORS;  Service: Orthopedics;  Laterality: Right;  partial arthroscopic medial menisectomy   TOTAL KNEE ARTHROPLASTY Right 04/22/2015   Procedure: TOTAL KNEE ARTHROPLASTY;  Surgeon: Earnestine Leys, MD;  Location: [facility] ORS;  Service: Orthopedics;  Laterality: Right;   TOTAL KNEE ARTHROPLASTY Left 10/30/2017   Procedure: TOTAL KNEE ARTHROPLASTY;  Surgeon: Earnestine Leys, MD;  Location: [facility] ORS;  Service: Orthopedics;  Laterality: Left;   TOTAL KNEE REVISION Left 01/02/2018   Procedure: poly exchange of tibia and patella left knee;  Surgeon: Earnestine Leys, MD;  Location: [facility] ORS;  Service: Orthopedics;  Laterality: Left;   Family History:  Family History  Problem Relation Age of Onset   CVA Mother        deceased at age 77   Depression Brother        Died by suicide at age 83   Family Psychiatric  History: See previous.  Brother died by suicide Social History:  Social History  Substance and Sexual Activity  Alcohol Use Yes   Comment: occ     Social History   Substance and Sexual Activity  Drug Use Yes   Frequency: 5.0 times per week   Types: Marijuana   Comment: marijuana    Social History   Socioeconomic History   Marital status: Single    Spouse name: Not on file   Number of children: Not on file   Years of education: Not on file   Highest education level: Not on file  Occupational History   Not on file  Tobacco Use    Smoking status: Former    Packs/day: 0.75    Years: 20.00    Pack years: 15.00    Types: Cigarettes    Quit date: 05/16/1984    Years since quitting: 36.7   Smokeless tobacco: Never  Vaping Use   Vaping Use: Never used  Substance and Sexual Activity   Alcohol use: Yes    Comment: occ   Drug use: Yes    Frequency: 5.0 times per week    Types: Marijuana    Comment: marijuana   Sexual activity: Not on file  Other Topics Concern   Not on file  Social History Narrative   Not on file   Social Determinants of Health   Financial Resource Strain: Not on file  Food Insecurity: Not on file  Transportation Needs: Not on file  Physical Activity: Not on file  Stress: Not on file  Social Connections: Not on file   Additional Social History:    Pain Medications: See PTA Prescriptions: See PTA Over the Counter: See PTA History of alcohol / drug use?: Yes Longest period of sobriety (when/how long): Unknown Negative Consequences of Use: Financial Name of Substance 1: Alcohol                  Sleep: Fair  Appetite:  Fair  Current Medications: Current Facility-Administered Medications  Medication Dose Route Frequency Provider Last Rate Last Admin   acetaminophen (TYLENOL) tablet 650 mg  650 mg Oral Q6H PRN Waldon Merl F, NP   650 mg at 01/21/21 0822   alum & mag hydroxide-simeth (MAALOX/MYLANTA) 200-200-20 MG/5ML suspension 30 mL  30 mL Oral Q4H PRN Waldon Merl F, NP       aspirin EC tablet 325 mg  325 mg Oral Daily Waldon Merl F, NP   325 mg at 01/23/21 [id number]   docusate sodium (COLACE) capsule 100 mg  100 mg Oral BID Waldon Merl F, NP   100 mg at 01/23/21 0819   enalapril (VASOTEC) tablet 10 mg  10 mg Oral Daily Waldon Merl F, NP   10 mg at 01/23/21 0818   FLUoxetine (PROZAC) capsule 80 mg  80 mg Oral Daily Waldon Merl F, NP   80 mg at 01/23/21 0816   gabapentin (NEURONTIN) capsule 400 mg  400 mg Oral TID Waldon Merl F, NP   400 mg at  01/23/21 0816   haloperidol (HALDOL) tablet 5 mg  5 mg Oral Q6H PRN Diem Pagnotta, Madie Reno, MD   5 mg at 01/21/21 2110   Or   haloperidol lactate (HALDOL) injection 5 mg  5 mg Intramuscular Q6H PRN Ledarrius Beauchaine, Madie Reno, MD   5 mg at 01/20/21 1436   hydrOXYzine (ATARAX) tablet 50 mg  50 mg Oral TID PRN Sherlon Handing, NP   50 mg at 01/22/21 2107   lithium carbonate (ESKALITH) CR tablet 450 mg  450 mg Oral Q12H  Aryiana Klinkner, Madie Reno, MD   450 mg at 01/23/21 0816   magnesium hydroxide (MILK OF MAGNESIA) suspension 30 mL  30 mL Oral Daily PRN Waldon Merl F, NP   30 mL at 01/22/21 0803   multivitamin with minerals tablet 1 tablet  1 tablet Oral Daily Waldon Merl F, NP   1 tablet at 01/23/21 0816   naltrexone (DEPADE) tablet 50 mg  50 mg Oral Daily Tejal Monroy, Madie Reno, MD   50 mg at 01/23/21 0818   pantoprazole (PROTONIX) EC tablet 40 mg  40 mg Oral Daily Waldon Merl F, NP   40 mg at 01/23/21 [id number]   QUEtiapine (SEROQUEL XR) 24 hr tablet 600 mg  600 mg Oral QHS Mahlia Fernando T, MD   600 mg at 01/22/21 2107   QUEtiapine (SEROQUEL) tablet 50 mg  50 mg Oral TID Etienne Millward, Madie Reno, MD   50 mg at 01/23/21 0816   senna-docusate (Senokot-S) tablet 2 tablet  2 tablet Oral Daily PRN Darrel Gloss, Madie Reno, MD   2 tablet at 01/22/21 1657    Lab Results: No results found for this or any previous visit (from the past 48 hour(s)).  Blood Alcohol level:  Lab Results  Component Value Date   ETH 121 (H) 01/17/2021   ETH <10 123XX123    Metabolic Disorder Labs: Lab Results  Component Value Date   HGBA1C 5.3 01/18/2021   MPG 105.41 01/18/2021   MPG 116.89 01/21/2019   No results found for: PROLACTIN Lab Results  Component Value Date   CHOL 202 (H) 01/18/2021   TRIG 172 (H) 01/18/2021   HDL 40 (L) 01/18/2021   CHOLHDL 5.1 01/18/2021   VLDL 34 01/18/2021   LDLCALC 128 (H) 01/18/2021   LDLCALC 98 01/07/2016    Physical Findings: AIMS:  , ,  ,  ,    CIWA:    COWS:     Musculoskeletal: Strength & Muscle  Tone: within normal limits Gait & Station: normal Patient leans: N/A  Psychiatric Specialty Exam:  Presentation  General Appearance: Appropriate for Environment  Eye Contact:Fair  Speech:No data recorded Speech Volume:Normal  Handedness:No data recorded  Mood and Affect  Mood:Anxious; Depressed; Dysphoric  Affect:Blunt   Thought Process  Thought Processes:Coherent  Descriptions of Associations:Intact  Orientation:Full (Time, Place and Person)  Thought Content:WDL  History of Schizophrenia/Schizoaffective disorder:No  Duration of Psychotic Symptoms:Less than six months  Hallucinations:No data recorded Ideas of Reference:Paranoia  Suicidal Thoughts:No data recorded Homicidal Thoughts:No data recorded  Sensorium  Memory:Immediate Good  Judgment:Fair  Insight:Fair   Executive Functions  Concentration:Fair  Attention Span:Good  [place]   Psychomotor Activity  Psychomotor Activity:No data recorded  Assets  Assets:Desire for Improvement; Resilience   Sleep  Sleep:No data recorded   Physical Exam: Physical Exam Vitals and nursing note reviewed.  Constitutional:      Appearance: Normal appearance.  HENT:     Head: Normocephalic and atraumatic.     Mouth/Throat:     Pharynx: Oropharynx is clear.  Eyes:     Pupils: Pupils are equal, round, and reactive to light.  Cardiovascular:     Rate and Rhythm: Normal rate and regular rhythm.  Pulmonary:     Effort: Pulmonary effort is normal.     Breath sounds: Normal breath sounds.  Abdominal:     General: Abdomen is flat.     Palpations: Abdomen is soft.  Musculoskeletal:        General: Normal range of  motion.  Skin:    General: Skin is warm and dry.  Neurological:     General: No focal deficit present.     Mental Status: He is alert. Mental status is at baseline.  Psychiatric:        Attention and Perception: Attention normal.        Mood and  Affect: Mood is depressed. Affect is blunt.        Speech: Speech normal.        Behavior: Behavior is cooperative.        Thought Content: Thought content includes suicidal ideation. Thought content does not include suicidal plan.        Cognition and Memory: Cognition normal.        Judgment: Judgment is impulsive.   Review of Systems  Constitutional: Negative.   HENT: Negative.    Eyes: Negative.   Respiratory: Negative.    Cardiovascular: Negative.   Gastrointestinal: Negative.   Musculoskeletal: Negative.   Skin: Negative.   Neurological: Negative.   Psychiatric/Behavioral:  Positive for depression, hallucinations and suicidal ideas. The patient is nervous/anxious.   Blood pressure 105/87, pulse 89, temperature 98 F (36.7 C), temperature source Oral, resp. rate 18, height 5\' 9"  (1.753 m), weight 74.4 kg, SpO2 98 %. Body mass index is 24.22 kg/m.   Treatment Plan Summary: Plan no change to psychiatric medicine.  Supportive counseling and therapy.  Encouraged patient to be out of his room and more interactive.  Encourage him to talk with staff and keep working on thoughts about living situation for follow-up.  Probably be ready to check a lithium level Monday.  Alethia Berthold, MD 01/23/2021, 10:29 AM

## 2021-01-23 NOTE — Plan of Care (Signed)
°  Problem: Education: Goal: Ability to state activities that reduce stress will improve Outcome: Progressing   Problem: Coping: Goal: Ability to identify and develop effective coping behavior will improve Outcome: Progressing   Problem: Education: Goal: Utilization of techniques to improve thought processes will improve Outcome: Progressing Goal: Knowledge of the prescribed therapeutic regimen will improve Outcome: Progressing   Problem: Education: Goal: Knowledge of [facility] Education information/materials will improve Outcome: Progressing Goal: Emotional status will improve Outcome: Progressing Goal: Mental status will improve Outcome: Progressing Goal: Verbalization of understanding the information provided will improve Outcome: Progressing   Problem: Education: Goal: Ability to incorporate positive changes in behavior to improve self-esteem will improve Outcome: Progressing   Problem: Health Behavior/Discharge Planning: Goal: Ability to remain free from injury will improve Outcome: Progressing

## 2021-01-23 NOTE — Progress Notes (Signed)
Patient alert and oriented x 4, affect is blunted thoughts are organized and coherent, no distress noted interacting appropriately with peers and staff. Patient complained that he was constipated hoping the medications he took will move his bowels, writer advised if he hasn't used bathroom in the morning he should notify the nurse. 15 minutes safety checks maintained will continue to monitor.

## 2021-01-23 NOTE — BHH Counselor (Signed)
CSW spoke with the patient on his housing situation.  Patient reports that at discharge he would like to return to the home of a friend, where he ws staying prior to admission.  He reports that he will keep the boarding house list with him and contact homes on the list the first of the month.    He declined any additional CSW support at this time.  Penni Homans, MSW, LCSW 01/23/2021 12:55 PM

## 2021-01-23 NOTE — Progress Notes (Signed)
Pt visible on the unit limited interaction with peers and staff. He denies SI/HI and no auditory or visual hallucinations. No signs of emotional distress, depression score 7/10 and anxiety score 5/10. No complaints voiced. Pt's goal is to working getting into a halfway until he can find an apartment.

## 2021-01-23 NOTE — Group Note (Signed)
LCSW Group Therapy Note  Group Date: 01/23/2021 Start Time: 1315 End Time: 1415   Type of Therapy and Topic:  Group Therapy - Healthy vs Unhealthy Coping Skills  Participation Level:  Active   Description of Group The focus of this group was to determine what unhealthy coping techniques typically are used by group members and what healthy coping techniques would be helpful in coping with various problems. Patients were guided in becoming aware of the differences between healthy and unhealthy coping techniques. Patients were asked to identify 2-3 healthy coping skills they would like to learn to use more effectively.  Therapeutic Goals Patients learned that coping is what human beings do all day long to deal with various situations in their lives Patients defined and discussed healthy vs unhealthy coping techniques Patients identified their preferred coping techniques and identified whether these were healthy or unhealthy Patients determined 2-3 healthy coping skills they would like to become more familiar with and use more often. Patients provided support and ideas to each other   Summary of Patient Progress:  Patient was present for the entirety of the group session. Patient was an active listener and participated in the topic of discussion, provided helpful advice to others, and added nuance to topic of conversation. Patient presented with a flat affect. Patient shared that he utilized the crisis line prior to his current admission and stated that he is glad that he reached out for help. Patient identified self harm as an unhealthy coping skill he has relied on in the past and acknowledged that he is in need of additional social support. Patient expressed interest in meeting with a therapist in the future but expressed frustration that a therapist is not always available during moments of crisis. Patient was encouraged to utilize the crisis line in the case that he experiences another mental  health emergency in the future. Patient shared that he plans to use a stress ball to help him cope with feelings of nervousness moving forward.     Therapeutic Modalities Cognitive Behavioral Therapy Motivational Interviewing  Norberto Sorenson, Theresia Majors 01/23/2021  3:35 PM

## 2021-01-24 DIAGNOSIS — F332 Major depressive disorder, recurrent severe without psychotic features: Secondary | ICD-10-CM | POA: Diagnosis not present

## 2021-01-24 NOTE — Progress Notes (Signed)
[facility] MD Progress Note  01/24/2021 2:28 PM [name]  MRN:  [id number] Subjective: Follow-up for this 61 year old man with depression and recent suicidal ideation.  Patient seen and chart reviewed.  He was up out of bed and actually seemed like he was feeling good.  Reactive affect.  Seems relaxed and appropriate in his physical posture.  Conversant.  Admits that he is feeling "better" and admits that he has not hurt himself although when asked directly continues to insist that he is plagued by constant thoughts about hurting himself.  He also tells me that he continues to hear voices all the time and see things.  Despite all this I had never seen him look like he was being distracted by internal stimuli or having any thought blocking or confusion.  Always looked like he was able to maintain an appropriate set of attention and behavior in conversation.  Therefore little hard what to do with this hallucination complaint.  Especially given that his mood is feeling better. Principal Problem: Severe recurrent major depression without psychotic features ([place]) Diagnosis: Principal Problem:   Severe recurrent major depression without psychotic features ([place]) Active Problems:   Nonsuicidal self-injury ([place])   Essential hypertension   Alcohol abuse  Total Time spent with patient: 30 minutes  Past Psychiatric History: Past history of recurrent chronic depression and anxiety  Past Medical History:  Past Medical History:  Diagnosis Date   Anxiety    Arthritis    knees and hands   Bipolar disorder ([place])    Depression    GERD (gastroesophageal reflux disease)    Hepatitis    HEP "C"   History of kidney stones    Hypertension    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 echo 5/17: EF 60-65%, no RWMA, LV dias fxn nl, LA mildly dilated, RV sys fxn nl, PASP nl, moderate sized circumferential pericardial effusion was identified, 2.12 cm  around the LV free wall, <1 cm around the RV free wall. Features were not c/w tamponade physiology   PTSD (post-traumatic stress disorder)    Witnessed brother's suicide.   Restless leg syndrome    Syncope     Past Surgical History:  Procedure Laterality Date   CYSTOSCOPY WITH URETEROSCOPY AND STENT PLACEMENT     ESOPHAGOGASTRODUODENOSCOPY N/A 01/11/2016   Procedure: ESOPHAGOGASTRODUODENOSCOPY (EGD);  Surgeon: Wilford Corner, MD;  Location: [facility] ENDOSCOPY;  Service: Endoscopy;  Laterality: N/A;   ESOPHAGOGASTRODUODENOSCOPY N/A 04/09/2020   Procedure: ESOPHAGOGASTRODUODENOSCOPY (EGD);  Surgeon: Jonathon Bellows, MD;  Location: [facility] ENDOSCOPY;  Service: Gastroenterology;  Laterality: N/A;   INCISION AND DRAINAGE ABSCESS Left 01/02/2018   Procedure: INCISION AND DRAINAGE LEFT KNEE;  Surgeon: Earnestine Leys, MD;  Location: [facility] ORS;  Service: Orthopedics;  Laterality: Left;   JOINT REPLACEMENT Right    TKR   KNEE ARTHROSCOPY Right 06/25/2014   Procedure: ARTHROSCOPY KNEE;  Surgeon: Earnestine Leys, MD;  Location: [facility] ORS;  Service: Orthopedics;  Laterality: Right;  partial arthroscopic medial menisectomy   TOTAL KNEE ARTHROPLASTY Right 04/22/2015   Procedure: TOTAL KNEE ARTHROPLASTY;  Surgeon: Earnestine Leys, MD;  Location: [facility] ORS;  Service: Orthopedics;  Laterality: Right;   TOTAL KNEE ARTHROPLASTY Left 10/30/2017   Procedure: TOTAL KNEE ARTHROPLASTY;  Surgeon: Earnestine Leys, MD;  Location: [facility] ORS;  Service: Orthopedics;  Laterality: Left;   TOTAL KNEE REVISION Left 01/02/2018   Procedure: poly exchange of tibia and patella left knee;  Surgeon:  Earnestine Leys, MD;  Location: [facility] ORS;  Service: Orthopedics;  Laterality: Left;   Family History:  Family History  Problem Relation Age of Onset   CVA Mother        deceased at age 7   Depression Brother        Died by suicide at age 15   Family Psychiatric  History: See previous Social History:  Social History   Substance and Sexual Activity   Alcohol Use Yes   Comment: occ     Social History   Substance and Sexual Activity  Drug Use Yes   Frequency: 5.0 times per week   Types: Marijuana   Comment: marijuana    Social History   Socioeconomic History   Marital status: Single    Spouse name: Not on file   Number of children: Not on file   Years of education: Not on file   Highest education level: Not on file  Occupational History   Not on file  Tobacco Use   Smoking status: Former    Packs/day: 0.75    Years: 20.00    Pack years: 15.00    Types: Cigarettes    Quit date: 05/16/1984    Years since quitting: 36.7   Smokeless tobacco: Never  Vaping Use   Vaping Use: Never used  Substance and Sexual Activity   Alcohol use: Yes    Comment: occ   Drug use: Yes    Frequency: 5.0 times per week    Types: Marijuana    Comment: marijuana   Sexual activity: Not on file  Other Topics Concern   Not on file  Social History Narrative   Not on file   Social Determinants of Health   Financial Resource Strain: Not on file  Food Insecurity: Not on file  Transportation Needs: Not on file  Physical Activity: Not on file  Stress: Not on file  Social Connections: Not on file   Additional Social History:    Pain Medications: See PTA Prescriptions: See PTA Over the Counter: See PTA History of alcohol / drug use?: Yes Longest period of sobriety (when/how long): Unknown Negative Consequences of Use: Financial Name of Substance 1: Alcohol                  Sleep: Fair  Appetite:  Fair  Current Medications: Current Facility-Administered Medications  Medication Dose Route Frequency Provider Last Rate Last Admin   acetaminophen (TYLENOL) tablet 650 mg  650 mg Oral Q6H PRN Waldon Merl F, NP   650 mg at 01/21/21 0822   alum & mag hydroxide-simeth (MAALOX/MYLANTA) 200-200-20 MG/5ML suspension 30 mL  30 mL Oral Q4H PRN Waldon Merl F, NP       aspirin EC tablet 325 mg  325 mg Oral Daily Waldon Merl  F, NP   325 mg at 01/24/21 0815   docusate sodium (COLACE) capsule 100 mg  100 mg Oral BID Waldon Merl F, NP   100 mg at 01/24/21 0815   enalapril (VASOTEC) tablet 10 mg  10 mg Oral Daily Waldon Merl F, NP   10 mg at 01/24/21 0814   FLUoxetine (PROZAC) capsule 80 mg  80 mg Oral Daily Waldon Merl F, NP   80 mg at 01/24/21 0815   gabapentin (NEURONTIN) capsule 400 mg  400 mg Oral TID Sherlon Handing, NP   400 mg at 01/24/21 1142   haloperidol (HALDOL) tablet 5 mg  5 mg Oral Q6H PRN Adaya Garmany, Madie Reno, MD  5 mg at 01/21/21 2110   Or   haloperidol lactate (HALDOL) injection 5 mg  5 mg Intramuscular Q6H PRN Dyneshia Baccam, Madie Reno, MD   5 mg at 01/20/21 1436   hydrOXYzine (ATARAX) tablet 50 mg  50 mg Oral TID PRN Sherlon Handing, NP   50 mg at 01/23/21 2117   lithium carbonate (ESKALITH) CR tablet 450 mg  450 mg Oral Q12H Khristian Seals T, MD   450 mg at 01/24/21 0815   magnesium hydroxide (MILK OF MAGNESIA) suspension 30 mL  30 mL Oral Daily PRN Waldon Merl F, NP   30 mL at 01/22/21 0803   multivitamin with minerals tablet 1 tablet  1 tablet Oral Daily Waldon Merl F, NP   1 tablet at 01/24/21 0815   naltrexone (DEPADE) tablet 50 mg  50 mg Oral Daily Dacia Capers, Madie Reno, MD   50 mg at 01/24/21 0814   pantoprazole (PROTONIX) EC tablet 40 mg  40 mg Oral Daily Waldon Merl F, NP   40 mg at 01/24/21 0815   QUEtiapine (SEROQUEL XR) 24 hr tablet 600 mg  600 mg Oral QHS Sartaj Hoskin T, MD   600 mg at 01/23/21 2118   QUEtiapine (SEROQUEL) tablet 50 mg  50 mg Oral TID Aleshia Cartelli, Madie Reno, MD   50 mg at 01/24/21 1142   senna-docusate (Senokot-S) tablet 2 tablet  2 tablet Oral Daily PRN Chris Narasimhan, Madie Reno, MD   2 tablet at 01/23/21 1218    Lab Results: No results found for this or any previous visit (from the past 48 hour(s)).  Blood Alcohol level:  Lab Results  Component Value Date   ETH 121 (H) 01/17/2021   ETH <10 123XX123    Metabolic Disorder Labs: Lab Results  Component Value Date    HGBA1C 5.3 01/18/2021   MPG 105.41 01/18/2021   MPG 116.89 01/21/2019   No results found for: PROLACTIN Lab Results  Component Value Date   CHOL 202 (H) 01/18/2021   TRIG 172 (H) 01/18/2021   HDL 40 (L) 01/18/2021   CHOLHDL 5.1 01/18/2021   VLDL 34 01/18/2021   LDLCALC 128 (H) 01/18/2021   LDLCALC 98 01/07/2016    Physical Findings: AIMS:  , ,  ,  ,    CIWA:    COWS:     Musculoskeletal: Strength & Muscle Tone: within normal limits Gait & Station: normal Patient leans: N/A  Psychiatric Specialty Exam:  Presentation  General Appearance: Appropriate for Environment  Eye Contact:Fair  Speech:No data recorded Speech Volume:Normal  Handedness:No data recorded  Mood and Affect  Mood:Anxious; Depressed; Dysphoric  Affect:Blunt   Thought Process  Thought Processes:Coherent  Descriptions of Associations:Intact  Orientation:Full (Time, Place and Person)  Thought Content:WDL  History of Schizophrenia/Schizoaffective disorder:No  Duration of Psychotic Symptoms:Less than six months  Hallucinations:No data recorded Ideas of Reference:Paranoia  Suicidal Thoughts:No data recorded Homicidal Thoughts:No data recorded  Sensorium  Memory:Immediate Good  Judgment:Fair  Insight:Fair   Executive Functions  Concentration:Fair  Attention Span:Good  [place]   Psychomotor Activity  Psychomotor Activity:No data recorded  Assets  Assets:Desire for Improvement; Resilience   Sleep  Sleep:No data recorded   Physical Exam: Physical Exam Vitals and nursing note reviewed.  Constitutional:      Appearance: Normal appearance.  HENT:     Head: Normocephalic and atraumatic.     Mouth/Throat:     Pharynx: Oropharynx is clear.  Eyes:     Pupils: Pupils are  equal, round, and reactive to light.  Cardiovascular:     Rate and Rhythm: Normal rate and regular rhythm.  Pulmonary:     Effort: Pulmonary effort is  normal.     Breath sounds: Normal breath sounds.  Abdominal:     General: Abdomen is flat.     Palpations: Abdomen is soft.  Musculoskeletal:        General: Normal range of motion.  Skin:    General: Skin is warm and dry.  Neurological:     General: No focal deficit present.     Mental Status: He is alert. Mental status is at baseline.  Psychiatric:        Attention and Perception: Attention normal. He perceives auditory hallucinations.        Mood and Affect: Mood normal.        Speech: Speech normal.        Behavior: Behavior is cooperative.        Thought Content: Thought content normal.        Cognition and Memory: Cognition normal.        Judgment: Judgment is impulsive.   Review of Systems  Constitutional: Negative.   HENT: Negative.    Eyes: Negative.   Respiratory: Negative.    Cardiovascular: Negative.   Gastrointestinal: Negative.   Musculoskeletal: Negative.   Skin: Negative.   Neurological: Negative.   Psychiatric/Behavioral:  Positive for hallucinations. Negative for suicidal ideas. The patient is nervous/anxious.   Blood pressure 118/82, pulse 99, temperature 97.9 F (36.6 C), temperature source Oral, resp. rate 18, height 5\' 9"  (1.753 m), weight 74.4 kg, SpO2 99 %. Body mass index is 24.22 kg/m.   Treatment Plan Summary: Medication management and Plan no change to medication.  Lithium level scheduled for tomorrow.  I told him today that we will need to work on planning a discharge for this week and he did not seem to have any problem with that.  Clearly does not have any acute intent to harm himself.  Unclear to me still whether his symptoms may be exaggerated but in any case by all observation he is functioning better than when he came in.  Alethia Berthold, MD 01/24/2021, 2:28 PM

## 2021-01-24 NOTE — Group Note (Signed)
LCSW Group Therapy Note  Group Date: 01/24/2021 Start Time: 1300 End Time: 1400   Type of Therapy and Topic:  Group Therapy - How To Cope with Nervousness about Discharge   Participation Level:  Active   Description of Group This process group involved identification of patients' feelings about discharge. Some of them are scheduled to be discharged soon, while others are new admissions, but each of them was asked to share thoughts and feelings surrounding discharge from the hospital. One common theme was that they are excited at the prospect of going home, while another was that many of them are apprehensive about sharing why they were hospitalized. Patients were given the opportunity to discuss these feelings with their peers in preparation for discharge.  Therapeutic Goals  Patient will identify their overall feelings about pending discharge. Patient will think about how they might proactively address issues that they believe will once again arise once they get home (i.e. with parents). Patients will participate in discussion about having hope for change.   Summary of Patient Progress:   Patient was present for the entirety of the group session. Patient was an active listener and participated in the topic of discussion, provided helpful advice to others, and added nuance to topic of conversation. Patient states that he is anxious about discharge. States that he has gotten the most on this admission to the hospital compared to prior admissions due to his level of effort and motivation.    Therapeutic Modalities Cognitive Behavioral Therapy   Almedia Balls 01/24/2021  2:33 PM

## 2021-01-24 NOTE — Plan of Care (Signed)
Met with PT in medication room. Denies SI / HI / AVH . Pt is adherent with scheduled medication. Pt is cooperative. No signs of distress or injury.    Problem: Education: Goal: Ability to state activities that reduce stress will improve Outcome: Not Progressing   Problem: Coping: Goal: Ability to identify and develop effective coping behavior will improve Outcome: Not Progressing   Problem: Education: Goal: Utilization of techniques to improve thought processes will improve Outcome: Not Progressing Goal: Knowledge of the prescribed therapeutic regimen will improve Outcome: Not Progressing

## 2021-01-25 DIAGNOSIS — F332 Major depressive disorder, recurrent severe without psychotic features: Secondary | ICD-10-CM | POA: Diagnosis not present

## 2021-01-25 LAB — LITHIUM LEVEL: Lithium Lvl: 1.06 mmol/L (ref 0.60–1.20)

## 2021-01-25 NOTE — Plan of Care (Signed)
°  Problem: Education: Goal: Ability to state activities that reduce stress will improve Outcome: Progressing   Problem: Coping: Goal: Ability to identify and develop effective coping behavior will improve Outcome: Progressing   Problem: Education: Goal: Utilization of techniques to improve thought processes will improve Outcome: Progressing Goal: Knowledge of the prescribed therapeutic regimen will improve Outcome: Progressing   Problem: Education: Goal: Knowledge of [place] Education information/materials will improve Outcome: Progressing Goal: Emotional status will improve Outcome: Progressing Goal: Mental status will improve Outcome: Progressing Goal: Verbalization of understanding the information provided will improve Outcome: Progressing

## 2021-01-25 NOTE — Progress Notes (Signed)
[name] MD Progress Note  01/25/2021 2:48 PM [name]  MRN:  [id number] Subjective: Patient seen and chart reviewed.  Patient is coming out of his room and interacting much better.  Attends groups.  Has not injured himself again.  Nevertheless he made it a point to talk with me today telling me that he continues to have intrusive thoughts about harming himself.  Presents as very distressed at that time although other times on the unit he looks pretty calm. Principal Problem: Severe recurrent major depression without psychotic features ([place]) Diagnosis: Principal Problem:   Severe recurrent major depression without psychotic features ([place]) Active Problems:   Nonsuicidal self-injury ([place])   Essential hypertension   Alcohol abuse  Total Time spent with patient: 30 minutes  Past Psychiatric History: Past history of recurrent mood instability and anxiety  Past Medical History:  Past Medical History:  Diagnosis Date   Anxiety    Arthritis    knees and hands   Bipolar disorder ([place])    Depression    GERD (gastroesophageal reflux disease)    Hepatitis    HEP "C"   History of kidney stones    Hypertension    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 echo 5/17: EF 60-65%, no RWMA, LV dias fxn nl, LA mildly dilated, RV sys fxn nl, PASP nl, moderate sized circumferential pericardial effusion was identified, 2.12 cm around the LV free wall, <1 cm around the RV free wall. Features were not c/w tamponade physiology   PTSD (post-traumatic stress disorder)    Witnessed brother's suicide.   Restless leg syndrome    Syncope     Past Surgical History:  Procedure Laterality Date   CYSTOSCOPY WITH URETEROSCOPY AND STENT PLACEMENT     ESOPHAGOGASTRODUODENOSCOPY N/A 01/11/2016   Procedure: ESOPHAGOGASTRODUODENOSCOPY (EGD);  Surgeon: Wilford Corner, MD;  Location[facility] ENDOSCOPY;  Service: Endoscopy;  Laterality: N/A;    ESOPHAGOGASTRODUODENOSCOPY N/A 04/09/2020   Procedure: ESOPHAGOGASTRODUODENOSCOPY (EGD);  Surgeon: Jonathon Bellows, MD;  Location[facility] END[facility]COPY;  Service: Gastroenterology;  Laterality: N/A;   INCISION AND DRAINAGE ABSCESS Left 01/02/2018   Procedure: INCISION AND DRAINAGE LEFT KNEE;  Surgeon: Earnestine Leys, MD;  Location: [facility] ORS;  Service: Orthopedics;  Laterality: Left;   JOINT REPLACEMENT Right    TKR   KNEE ARTHROSCOPY Right 06/25/2014   Procedure: ARTHROSCOPY KNEE;  Surgeon: Earnestine Leys, MD;  Location: [facility] ORS;  Service: Orthopedics;  Laterality: Right;  partial arthroscopic medial menisectomy   TOTAL KNEE ARTHROPLASTY Right 04/22/2015   Procedure: TOTAL KNEE ARTHROPLASTY;  Surgeon: Earnestine Leys, MD;  Location: [facility] ORS;  Service: Orthopedics;  Laterality: Right;   TOTAL KNEE ARTHROPLASTY Left 10/30/2017   Procedure: TOTAL KNEE ARTHROPLASTY;  Surgeon: Earnestine Leys, MD;  Location: [facility] ORS;  Service: Orthopedics;  Laterality: Left;   TOTAL KNEE REVISION Left 01/02/2018   Procedure: poly exchange of tibia and patella left knee;  Surgeon: Earnestine Leys, MD;  Location: [facility] ORS;  Service: Orthopedics;  Laterality: Left;   Family History:  Family History  Problem Relation Age of Onset   CVA Mother        deceased at age 43   Depression Brother        Died by suicide at age 72   Family Psychiatric  History: See previous Social History:  Social History   Substance and Sexual Activity  Alcohol Use Yes   Comment: occ     Social  History   Substance and Sexual Activity  Drug Use Yes   Frequency: 5.0 times per week   Types: Marijuana   Comment: marijuana    Social History   Socioeconomic History   Marital status: Single    Spouse name: Not on file   Number of children: Not on file   Years of education: Not on file   Highest education level: Not on file  Occupational History   Not on file  Tobacco Use   Smoking status: Former    Packs/day: 0.75    Years: 20.00     Pack years: 15.00    Types: Cigarettes    Quit date: 05/16/1984    Years since quitting: 36.7   Smokeless tobacco: Never  Vaping Use   Vaping Use: Never used  Substance and Sexual Activity   Alcohol use: Yes    Comment: occ   Drug use: Yes    Frequency: 5.0 times per week    Types: Marijuana    Comment: marijuana   Sexual activity: Not on file  Other Topics Concern   Not on file  Social History Narrative   Not on file   Social Determinants of Health   Financial Resource Strain: Not on file  Food Insecurity: Not on file  Transportation Needs: Not on file  Physical Activity: Not on file  Stress: Not on file  Social Connections: Not on file   Additional Social History:    Pain Medications: See PTA Prescriptions: See PTA Over the Counter: See PTA History of alcohol / drug use?: Yes Longest period of sobriety (when/how long): Unknown Negative Consequences of Use: Financial Name of Substance 1: Alcohol                  Sleep: Fair  Appetite:  Fair  Current Medications: Current Facility-Administered Medications  Medication Dose Route Frequency Provider Last Rate Last Admin   acetaminophen (TYLENOL) tablet 650 mg  650 mg Oral Q6H PRN Waldon Merl F, NP   650 mg at 01/21/21 0822   alum & mag hydroxide-simeth (MAALOX/MYLANTA) 200-200-20 MG/5ML suspension 30 mL  30 mL Oral Q4H PRN Waldon Merl F, NP       aspirin EC tablet 325 mg  325 mg Oral Daily Waldon Merl F, NP   325 mg at 01/25/21 0844   docusate sodium (COLACE) capsule 100 mg  100 mg Oral BID Waldon Merl F, NP   100 mg at 01/25/21 0817   enalapril (VASOTEC) tablet 10 mg  10 mg Oral Daily Waldon Merl F, NP   10 mg at 01/25/21 0816   FLUoxetine (PROZAC) capsule 80 mg  80 mg Oral Daily Waldon Merl F, NP   80 mg at 01/25/21 0817   gabapentin (NEURONTIN) capsule 400 mg  400 mg Oral TID Waldon Merl F, NP   400 mg at 01/25/21 1156   haloperidol (HALDOL) tablet 5 mg  5 mg Oral Q6H PRN  Rowynn Mcweeney, Madie Reno, MD   5 mg at 01/24/21 2117   Or   haloperidol lactate (HALDOL) injection 5 mg  5 mg Intramuscular Q6H PRN [place] Hagger, Madie Reno, MD   5 mg at 01/20/21 1436   hydrOXYzine (ATARAX) tablet 50 mg  50 mg Oral TID PRN Sherlon Handing, NP   50 mg at 01/25/21 1156   lithium carbonate (ESKALITH) CR tablet 450 mg  450 mg Oral Q12H Merriam Brandner, Madie Reno, MD   450 mg at 01/25/21 0818   magnesium hydroxide (MILK OF  MAGNESIA) suspension 30 mL  30 mL Oral Daily PRN Waldon Merl F, NP   30 mL at 01/22/21 0803   multivitamin with minerals tablet 1 tablet  1 tablet Oral Daily Waldon Merl F, NP   1 tablet at 01/25/21 0817   naltrexone (DEPADE) tablet 50 mg  50 mg Oral Daily Munirah Doerner, Madie Reno, MD   50 mg at 01/25/21 0816   pantoprazole (PROTONIX) EC tablet 40 mg  40 mg Oral Daily Waldon Merl F, NP   40 mg at 01/25/21 [id number]   QUEtiapine (SEROQUEL XR) 24 hr tablet 600 mg  600 mg Oral QHS Manuelito Poage T, MD   600 mg at 01/24/21 2117   QUEtiapine (SEROQUEL) tablet 50 mg  50 mg Oral TID Dezaree Tracey, Madie Reno, MD   50 mg at 01/25/21 1156   senna-docusate (Senokot-S) tablet 2 tablet  2 tablet Oral Daily PRN Rosealie Reach, Madie Reno, MD   2 tablet at 01/23/21 1218    Lab Results:  Results for orders placed or performed during the hospital encounter of 01/18/21 (from the past 48 hour(s))  Lithium level     Status: None   Collection Time: 01/25/21  7:07 AM  Result Value Ref Range   Lithium Lvl 1.06 0.60 - 1.20 mmol/L    Comment: Performed at [facility], [address] [place], [place] [address]    Blood Alcohol level:  Lab Results  Component Value Date   ETH 121 (H) 01/17/2021   ETH <10 [id number]    Metabolic Disorder Labs: Lab Results  Component Value Date   HGBA1C 5.3 01/18/2021   MPG 105.41 01/18/2021   MPG 116.89 01/21/2019   No results found for: PROLACTIN Lab Results  Component Value Date   CHOL 202 (H) 01/18/2021   TRIG 172 (H) 01/18/2021   HDL 40 (L) 01/18/2021   CHOLHDL 5.1  01/18/2021   VLDL 34 01/18/2021   LDLCALC 128 (H) 01/18/2021   LDLCALC 98 01/07/2016    Physical Findings: AIMS:  , ,  ,  ,    CIWA:    COWS:     Musculoskeletal: Strength & Muscle Tone: within normal limits Gait & Station: normal Patient leans: N/A  Psychiatric Specialty Exam:  Presentation  General Appearance: Appropriate for Environment  Eye Contact:Fair  Speech:No data recorded Speech Volume:Normal  Handedness:No data recorded  Mood and Affect  Mood:Anxious; Depressed; Dysphoric  Affect:Blunt   Thought Process  Thought Processes:Coherent  Descriptions of Associations:Intact  Orientation:Full (Time, Place and Person)  Thought Content:WDL  History of Schizophrenia/Schizoaffective disorder:No  Duration of Psychotic Symptoms:Less than six months  Hallucinations:No data recorded Ideas of Reference:Paranoia  Suicidal Thoughts:No data recorded Homicidal Thoughts:No data recorded  Sensorium  Memory:Immediate Good  Judgment:Fair  Insight:Fair   Executive Functions  Concentration:Fair  Attention Span:Good  [place]   Psychomotor Activity  Psychomotor Activity:No data recorded  Assets  Assets:Desire for Improvement; Resilience   Sleep  Sleep:No data recorded   Physical Exam: Physical Exam Vitals and nursing note reviewed.  Constitutional:      Appearance: Normal appearance.  HENT:     Head: Normocephalic and atraumatic.     Mouth/Throat:     Pharynx: Oropharynx is clear.  Eyes:     Pupils: Pupils are equal, round, and reactive to light.  Cardiovascular:     Rate and Rhythm: Normal rate and regular rhythm.  Pulmonary:     Effort: Pulmonary effort is normal.  Breath sounds: Normal breath sounds.  Abdominal:     General: Abdomen is flat.     Palpations: Abdomen is soft.  Musculoskeletal:        General: Normal range of motion.  Skin:    General: Skin is warm and dry.   Neurological:     General: No focal deficit present.     Mental Status: He is alert. Mental status is at baseline.  Psychiatric:        Attention and Perception: Attention normal.        Mood and Affect: Mood is anxious and depressed.        Speech: Speech normal.        Behavior: Behavior is cooperative.        Thought Content: Thought content normal.        Cognition and Memory: Cognition normal.        Judgment: Judgment is impulsive.   Review of Systems  Constitutional: Negative.   HENT: Negative.    Eyes: Negative.   Respiratory: Negative.    Cardiovascular: Negative.   Gastrointestinal: Negative.   Musculoskeletal: Negative.   Skin: Negative.   Neurological: Negative.   Psychiatric/Behavioral:  Positive for hallucinations. The patient is nervous/anxious.   Blood pressure (!) 113/93, pulse 88, temperature 97.9 F (36.6 C), temperature source Oral, resp. rate 18, height 5\' 9"  (1.753 m), weight 74.4 kg, SpO2 99 %. Body mass index is 24.22 kg/m.   Treatment Plan Summary: Medication management and Plan no change to medication today.  Tried to be upbeat and positive with the patient reinforcing that he is not hurting himself.  He is already on 80 mg of fluoxetine a pretty good dose of mood stabilizer and now has a therapeutic lithium level of 1.05.  Not sure what if anything we could add that is going to really make a difference to these intrusive thoughts.  Patient has been showing safe behavior recently.  We are hoping for discharge this week.  Alethia Berthold, MD 01/25/2021, 2:48 PM

## 2021-01-25 NOTE — Progress Notes (Signed)
Patient stated that he is overwhelmed with the raising thoughts of killing himself. Patient contracts for safety. Patient had Vistaril for anxiety. Patient appears relaxed and appropriate with staff & peers. Visible in the milieu. Appetite and energy level good. Patient stated that he is cleared with his constipation. Support and encouragement given.

## 2021-01-25 NOTE — Progress Notes (Signed)
Patient alert and oriented x 4, affect is blunted thoughts are organized and coherent, no distress noted he complained of  anxiety was medicated by PRN orders. He denies SI/HI/AVH, 15 minutes safety checks maintained will continue to monitor.

## 2021-01-25 NOTE — Group Note (Signed)
[facility] LCSW Group Therapy Note    Group Date: 01/25/2021 Start Time: 1300 End Time: 1400  Type of Therapy and Topic:  Group Therapy:  Overcoming Obstacles  Participation Level:  [facility] PARTICIPATION LEVEL: Active  Mood:  Description of Group:   In this group patients will be encouraged to explore what they see as obstacles to their own wellness and recovery. They will be guided to discuss their thoughts, feelings, and behaviors related to these obstacles. The group will process together ways to cope with barriers, with attention given to specific choices patients can make. Each patient will be challenged to identify changes they are motivated to make in order to overcome their obstacles. This group will be process-oriented, with patients participating in exploration of their own experiences as well as giving and receiving support and challenge from other group members.  Therapeutic Goals: 1. Patient will identify personal and current obstacles as they relate to admission. 2. Patient will identify barriers that currently interfere with their wellness or overcoming obstacles.  3. Patient will identify feelings, thought process and behaviors related to these barriers. 4. Patient will identify two changes they are willing to make to overcome these obstacles:    Summary of Patient Progress Patient was present in group. Patient shared that his obstacles has been "myself and housing".  Patient was attentive and supportive of other group.  Patient was able to identify the barriers to overcoming these obstacles and steps that he can take to overcome them successfully.    Therapeutic Modalities:   Cognitive Behavioral Therapy Solution Focused Therapy Motivational Interviewing Relapse Prevention Therapy   Rozann Lesches, LCSW

## 2021-01-25 NOTE — Progress Notes (Signed)
Recreation Therapy Notes   Date: 01/25/2021  Time: 10:45am   Location: Craft room   Behavioral response: Appropriate  Intervention Topic:  Goals      Discussion/Intervention:  Group content on today was focused on goals. Patients described what goals are and how they define goals. Individuals expressed how they go about setting goals and reaching them. The group identified how important goals are and if they make short term goals to reach long term goals. Patients described how many goals they work on at a time and what affects them not reaching their goal. Individuals described how much time they put into planning and obtaining their goals. The group participated in the intervention My Goal Board and made personal goal boards to help them achieve their goal. Clinical Observations/Feedback: Patient came to group identified goals as having a place to live, food and money. He explained that goals are important to strive for something more. Individual was social with peers and staff while participating in the intervention.    Crystalle Popwell LRT/CTRS         Gennell How 01/25/2021 12:08 PM

## 2021-01-25 NOTE — BH IP Treatment Plan (Signed)
Interdisciplinary Treatment and Diagnostic Plan Update  01/25/2021 Time of Session: 8:30AM [name] MRN: [id number]  Principal Diagnosis: Severe recurrent major depression without psychotic features [facility])  Secondary Diagnoses: Principal Problem:   Severe recurrent major depression without psychotic features (HCC) Active Problems:   Nonsuicidal self-injury (HCC)   Essential hypertension   Alcohol abuse   Current Medications:  Current Facility-Administered Medications  Medication Dose Route Frequency Provider Last Rate Last Admin   acetaminophen (TYLENOL) tablet 650 mg  650 mg Oral Q6H PRN Gabriel Cirri F, NP   650 mg at 01/21/21 3818   alum & mag hydroxide-simeth (MAALOX/MYLANTA) 200-200-20 MG/5ML suspension 30 mL  30 mL Oral Q4H PRN Gabriel Cirri F, NP       aspirin EC tablet 325 mg  325 mg Oral Daily Gabriel Cirri F, NP   325 mg at 01/24/21 0815   docusate sodium (COLACE) capsule 100 mg  100 mg Oral BID Gabriel Cirri F, NP   100 mg at 01/25/21 0817   enalapril (VASOTEC) tablet 10 mg  10 mg Oral Daily Gabriel Cirri F, NP   10 mg at 01/25/21 0816   FLUoxetine (PROZAC) capsule 80 mg  80 mg Oral Daily Gabriel Cirri F, NP   80 mg at 01/25/21 0817   gabapentin (NEURONTIN) capsule 400 mg  400 mg Oral TID Gabriel Cirri F, NP   400 mg at 01/25/21 0817   haloperidol (HALDOL) tablet 5 mg  5 mg Oral Q6H PRN Clapacs, Jackquline Denmark, MD   5 mg at 01/24/21 2117   Or   haloperidol lactate (HALDOL) injection 5 mg  5 mg Intramuscular Q6H PRN Clapacs, Jackquline Denmark, MD   5 mg at 01/20/21 1436   hydrOXYzine (ATARAX) tablet 50 mg  50 mg Oral TID PRN Vanetta Mulders, NP   50 mg at 01/24/21 2250   lithium carbonate (ESKALITH) CR tablet 450 mg  450 mg Oral Q12H Clapacs, John T, MD   450 mg at 01/25/21 0818   magnesium hydroxide (MILK OF MAGNESIA) suspension 30 mL  30 mL Oral Daily PRN Gabriel Cirri F, NP   30 mL at 01/22/21 0803   multivitamin with minerals tablet 1 tablet  1 tablet Oral  Daily Gabriel Cirri F, NP   1 tablet at 01/25/21 0817   naltrexone (DEPADE) tablet 50 mg  50 mg Oral Daily Clapacs, Jackquline Denmark, MD   50 mg at 01/25/21 0816   pantoprazole (PROTONIX) EC tablet 40 mg  40 mg Oral Daily Gabriel Cirri F, NP   40 mg at 01/25/21 2993   QUEtiapine (SEROQUEL XR) 24 hr tablet 600 mg  600 mg Oral QHS Clapacs, John T, MD   600 mg at 01/24/21 2117   QUEtiapine (SEROQUEL) tablet 50 mg  50 mg Oral TID Clapacs, Jackquline Denmark, MD   50 mg at 01/25/21 0818   senna-docusate (Senokot-S) tablet 2 tablet  2 tablet Oral Daily PRN Clapacs, Jackquline Denmark, MD   2 tablet at 01/23/21 1218   PTA Medications: Medications Prior to Admission  Medication Sig Dispense Refill Last Dose   aspirin EC 325 MG tablet Take 1 tablet (325 mg total) by mouth daily. 30 tablet 0    benztropine (COGENTIN) 1 MG tablet Take 1 mg by mouth 2 (two) times daily. (Patient not taking: Reported on 01/18/2021)      diphenoxylate-atropine (LOMOTIL) 2.5-0.025 MG tablet Take 2 tablets by mouth 4 (four) times daily as needed. (Patient not taking: Reported on 01/18/2021)  divalproex (DEPAKOTE ER) 500 MG 24 hr tablet Take 500 mg by mouth 2 (two) times daily. (Patient not taking: Reported on 01/18/2021)      divalproex (DEPAKOTE) 500 MG DR tablet Take 1 tablet (500 mg total) by mouth 2 (two) times daily. 60 tablet 0    docusate sodium (COLACE) 100 MG capsule Take 1 capsule (100 mg total) by mouth 2 (two) times daily. 30 capsule 0    enalapril (VASOTEC) 10 MG tablet Take 1 tablet (10 mg total) by mouth daily. 30 tablet 0    FLUoxetine (PROZAC) 40 MG capsule Take 2 capsules (80 mg total) by mouth daily. 60 capsule 0    gabapentin (NEURONTIN) 400 MG capsule Take 400 mg by mouth 3 (three) times daily.      Multiple Vitamin (MULTIVITAMIN WITH MINERALS) TABS tablet Take 1 tablet by mouth daily.      oxyCODONE (ROXICODONE) 5 MG immediate release tablet Take 1 tablet (5 mg total) by mouth every 8 (eight) hours as needed. (Patient not taking:  Reported on 01/18/2021) 15 tablet 0    pantoprazole (PROTONIX) 40 MG tablet Take 1 tablet (40 mg total) by mouth daily. 30 tablet 2    polyethylene glycol (MIRALAX / GLYCOLAX) 17 g packet Take 17 g by mouth daily as needed for mild constipation or moderate constipation. (Patient not taking: Reported on 02/01/2019) 14 each 0    QUEtiapine (SEROQUEL) 50 MG tablet Take 50 mg by mouth 4 (four) times daily as needed (for agitation).       SEROQUEL XR 400 MG 24 hr tablet Take 1 tablet (400 mg total) by mouth at bedtime. 30 tablet 0    traMADol (ULTRAM) 50 MG tablet Take 2 tablets (100 mg total) by mouth every 6 (six) hours. (Patient not taking: Reported on 04/07/2020) 25 tablet 0     Patient Stressors: Financial difficulties   Loss of relationship   Traumatic event    Patient Strengths: Average or above average intelligence  Communication skills  General fund of knowledge  Motivation for treatment/growth   Treatment Modalities: Medication Management, Group therapy, Case management,  1 to 1 session with clinician, Psychoeducation, Recreational therapy.   Physician Treatment Plan for Primary Diagnosis: Severe recurrent major depression without psychotic features (HCC) Long Term Goal(s): Improvement in symptoms so as ready for discharge   Short Term Goals: Compliance with prescribed medications will improve Ability to identify triggers associated with substance abuse/mental health issues will improve Ability to disclose and discuss suicidal ideas Ability to demonstrate self-control will improve Ability to identify and develop effective coping behaviors will improve  Medication Management: Evaluate patient's response, side effects, and tolerance of medication regimen.  Therapeutic Interventions: 1 to 1 sessions, Unit Group sessions and Medication administration.  Evaluation of Outcomes: Progressing  Physician Treatment Plan for Secondary Diagnosis: Principal Problem:   Severe recurrent major  depression without psychotic features (HCC) Active Problems:   Nonsuicidal self-injury (HCC)   Essential hypertension   Alcohol abuse  Long Term Goal(s): Improvement in symptoms so as ready for discharge   Short Term Goals: Compliance with prescribed medications will improve Ability to identify triggers associated with substance abuse/mental health issues will improve Ability to disclose and discuss suicidal ideas Ability to demonstrate self-control will improve Ability to identify and develop effective coping behaviors will improve     Medication Management: Evaluate patient's response, side effects, and tolerance of medication regimen.  Therapeutic Interventions: 1 to 1 sessions, Unit Group sessions and Medication administration.  Evaluation of Outcomes: Progressing   RN Treatment Plan for Primary Diagnosis: Severe recurrent major depression without psychotic features (HCC) Long Term Goal(s): Knowledge of disease and therapeutic regimen to maintain health will improve  Short Term Goals: Ability to remain free from injury will improve, Ability to verbalize frustration and anger appropriately will improve, Ability to demonstrate self-control, Ability to participate in decision making will improve, Ability to verbalize feelings will improve, Ability to disclose and discuss suicidal ideas, and Ability to identify and develop effective coping behaviors will improve  Medication Management: RN will administer medications as ordered by provider, will assess and evaluate patient's response and provide education to patient for prescribed medication. RN will report any adverse and/or side effects to prescribing provider.  Therapeutic Interventions: 1 on 1 counseling sessions, Psychoeducation, Medication administration, Evaluate responses to treatment, Monitor vital signs and CBGs as ordered, Perform/monitor CIWA, COWS, AIMS and Fall Risk screenings as ordered, Perform wound care treatments as  ordered.  Evaluation of Outcomes: Progressing   LCSW Treatment Plan for Primary Diagnosis: Severe recurrent major depression without psychotic features (HCC) Long Term Goal(s): Safe transition to appropriate next level of care at discharge, Engage patient in therapeutic group addressing interpersonal concerns.  Short Term Goals: Engage patient in aftercare planning with referrals and resources, Increase social support, Increase ability to appropriately verbalize feelings, Increase emotional regulation, Facilitate acceptance of mental health diagnosis and concerns, and Increase skills for wellness and recovery  Therapeutic Interventions: Assess for all discharge needs, 1 to 1 time with Social worker, Explore available resources and support systems, Assess for adequacy in community support network, Educate family and significant other(s) on suicide prevention, Complete Psychosocial Assessment, Interpersonal group therapy.  Evaluation of Outcomes: Progressing   Progress in Treatment: Attending groups: Yes. Participating in groups: Yes. Taking medication as prescribed: Yes. Toleration medication: Yes. Family/Significant other contact made: Yes, individual(s) contacted:  SPE completed with the patient.  Patient declined collateral contact.  Patient understands diagnosis: Yes. Discussing patient identified problems/goals with staff: Yes. Medical problems stabilized or resolved: Yes. Denies suicidal/homicidal ideation: Yes. Issues/concerns per patient self-inventory: No. Other: none  New problem(s) identified: No, Describe:  none  New Short Term/Long Term Goal(s): medication management for mood stabilization; elimination of SI thoughts; development of comprehensive mental wellness plan. Update 01/25/2021:  No changes at this time.    Patient Goals: "I need to work on this anxiety, depression, and wanting to hurt myself." Update 01/25/2021:  No changes at this time.    Discharge Plan or  Barriers: CSW will assist pt with development of an appropriate aftercare/discharge plan.  Update 01/25/2021:  Pt reports that he will return to his previous living situation with a peer at discharge.  CSW will assist patient with any aftercare needs, including appointment for medication management and therapy and transportation if needed.    Reason for Continuation of Hospitalization: Depression Medication stabilization Suicidal ideation   Estimated Length of Stay: 1-7 days Update 01/25/2021:  No changes at this time.    Scribe for Treatment Team: Harden Mo, Alexander Mt 01/25/2021 8:57 AM

## 2021-01-26 DIAGNOSIS — F332 Major depressive disorder, recurrent severe without psychotic features: Secondary | ICD-10-CM | POA: Diagnosis not present

## 2021-01-26 MED ORDER — COVID-19 MRNA VAC-TRIS(PFIZER) 30 MCG/0.3ML IM SUSP: 0.3000 mL | Freq: Once | INTRAMUSCULAR | Status: DC

## 2021-01-26 MED ORDER — LITHIUM CARBONATE ER 300 MG PO TBCR
300.0000 mg | EXTENDED_RELEASE_TABLET | Freq: Two times a day (BID) | ORAL | Status: DC
  Administered 2021-01-26 – 2021-01-28 (×4): 300 mg via ORAL
  Filled 2021-01-26 (×4): qty 1

## 2021-01-26 NOTE — Progress Notes (Signed)
Recreation Therapy Notes    Date: 01/26/2021  Time: 10:20 am    Location: Craft room    Behavioral response: N/A   Intervention Topic: Problem Solving    Discussion/Intervention: Patient did not attend group.   Clinical Observations/Feedback:  Patient did not attend group.    Pacey Altizer LRT/CTRS        Zyliah Schier 01/26/2021 1:01 PM

## 2021-01-26 NOTE — Progress Notes (Signed)
[facility] MD Progress Note  01/26/2021 3:45 PM Roxy Kemme  MRN:  [id number] Subjective: Patient seen and chart reviewed.  Patient reports mood is stable.  Continues to claim that he is having hallucinations but as usual never shows any sign of responding to internal stimuli or being distracted.  Not acting out or aggressive has not tried to hurt himself in many days.  No new complaints.  He claimed to me today that he has no idea of any place to live Principal Problem: Severe recurrent major depression without psychotic features ([place]) Diagnosis: Principal Problem:   Severe recurrent major depression without psychotic features ([place]) Active Problems:   Nonsuicidal self-injury ([place])   Essential hypertension   Alcohol abuse  Total Time spent with patient: 30 minutes  Past Psychiatric History: Past history of longstanding depression  Past Medical History:  Past Medical History:  Diagnosis Date   Anxiety    Arthritis    knees and hands   Bipolar disorder ([place])    Depression    GERD (gastroesophageal reflux disease)    Hepatitis    HEP "C"   History of kidney stones    Hypertension    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 echo 5/17: EF 60-65%, no RWMA, LV dias fxn nl, LA mildly dilated, RV sys fxn nl, PASP nl, moderate sized circumferential pericardial effusion was identified, 2.12 cm around the LV free wall, <1 cm around the RV free wall. Features were not c/w tamponade physiology   PTSD (post-traumatic stress disorder)    Witnessed brother's suicide.   Restless leg syndrome    Syncope     Past Surgical History:  Procedure Laterality Date   CYSTOSCOPY WITH URETEROSCOPY AND STENT PLACEMENT     ESOPHAGOGASTRODUODENOSCOPY N/A 01/11/2016   Procedure: ESOPHAGOGASTRODUODENOSCOPY (EGD);  Surgeon: Wilford Corner, MD;  Location: [facility] ENDOSCOPY;  Service: Endoscopy;  Laterality: N/A;   ESOPHAGOGASTRODUODENOSCOPY N/A 04/09/2020    Procedure: ESOPHAGOGASTRODUODENOSCOPY (EGD);  Surgeon: Jonathon Bellows, MD;  Location: [facility] ENDOSCOPY;  Service: Gastroenterology;  Laterality: N/A;   INCISION AND DRAINAGE ABSCESS Left 01/02/2018   Procedure: INCISION AND DRAINAGE LEFT KNEE;  Surgeon: Earnestine Leys, MD;  Location: [facility] ORS;  Service: Orthopedics;  Laterality: Left;   JOINT REPLACEMENT Right    TKR   KNEE ARTHROSCOPY Right 06/25/2014   Procedure: ARTHROSCOPY KNEE;  Surgeon: Earnestine Leys, MD;  Location: [facility] ORS;  Service: Orthopedics;  Laterality: Right;  partial arthroscopic medial menisectomy   TOTAL KNEE ARTHROPLASTY Right 04/22/2015   Procedure: TOTAL KNEE ARTHROPLASTY;  Surgeon: Earnestine Leys, MD;  Location: [facility] ORS;  Service: Orthopedics;  Laterality: Right;   TOTAL KNEE ARTHROPLASTY Left 10/30/2017   Procedure: TOTAL KNEE ARTHROPLASTY;  Surgeon: Earnestine Leys, MD;  Location: [facility] ORS;  Service: Orthopedics;  Laterality: Left;   TOTAL KNEE REVISION Left 01/02/2018   Procedure: poly exchange of tibia and patella left knee;  Surgeon: Earnestine Leys, MD;  Location: [facility] ORS;  Service: Orthopedics;  Laterality: Left;   Family History:  Family History  Problem Relation Age of Onset   CVA Mother        deceased at age 31   Depression Brother        Died by suicide at age 14   Family Psychiatric  History: See previous Social History:  Social History   Substance and Sexual Activity  Alcohol Use Yes   Comment: occ     Social History  Substance and Sexual Activity  Drug Use Yes   Frequency: 5.0 times per week   Types: Marijuana   Comment: marijuana    Social History   Socioeconomic History   Marital status: Single    Spouse name: Not on file   Number of children: Not on file   Years of education: Not on file   Highest education level: Not on file  Occupational History   Not on file  Tobacco Use   Smoking status: Former    Packs/day: 0.75    Years: 20.00    Pack years: 15.00    Types: Cigarettes     Quit date: 05/16/1984    Years since quitting: 36.7   Smokeless tobacco: Never  Vaping Use   Vaping Use: Never used  Substance and Sexual Activity   Alcohol use: Yes    Comment: occ   Drug use: Yes    Frequency: 5.0 times per week    Types: Marijuana    Comment: marijuana   Sexual activity: Not on file  Other Topics Concern   Not on file  Social History Narrative   Not on file   Social Determinants of Health   Financial Resource Strain: Not on file  Food Insecurity: Not on file  Transportation Needs: Not on file  Physical Activity: Not on file  Stress: Not on file  Social Connections: Not on file   Additional Social History:    Pain Medications: See PTA Prescriptions: See PTA Over the Counter: See PTA History of alcohol / drug use?: Yes Longest period of sobriety (when/how long): Unknown Negative Consequences of Use: Financial Name of Substance 1: Alcohol                  Sleep: Fair  Appetite:  Fair  Current Medications: Current Facility-Administered Medications  Medication Dose Route Frequency Provider Last Rate Last Admin   acetaminophen (TYLENOL) tablet 650 mg  650 mg Oral Q6H PRN Waldon Merl F, NP   650 mg at 01/21/21 0822   alum & mag hydroxide-simeth (MAALOX/MYLANTA) 200-200-20 MG/5ML suspension 30 mL  30 mL Oral Q4H PRN Waldon Merl F, NP       aspirin EC tablet 325 mg  325 mg Oral Daily Waldon Merl F, NP   325 mg at 01/26/21 0847   [START ON 01/27/2021] COVID-19 mRNA Vac-TriS ([employer]) injection 0.3 mL  0.3 mL Intramuscular ONCE-1600 Emonee Winkowski T, MD       docusate sodium (COLACE) capsule 100 mg  100 mg Oral BID Waldon Merl F, NP   100 mg at 01/26/21 0812   enalapril (VASOTEC) tablet 10 mg  10 mg Oral Daily Waldon Merl F, NP   10 mg at 01/26/21 0813   FLUoxetine (PROZAC) capsule 80 mg  80 mg Oral Daily Waldon Merl F, NP   80 mg at 01/26/21 [id number]   gabapentin (NEURONTIN) capsule 400 mg  400 mg Oral TID Waldon Merl F,  NP   400 mg at 01/26/21 1205   haloperidol (HALDOL) tablet 5 mg  5 mg Oral Q6H PRN Gordie Belvin, Madie Reno, MD   5 mg at 01/26/21 1210   Or   haloperidol lactate (HALDOL) injection 5 mg  5 mg Intramuscular Q6H PRN Quinterius Gaida, Madie Reno, MD   5 mg at 01/20/21 1436   hydrOXYzine (ATARAX) tablet 50 mg  50 mg Oral TID PRN Sherlon Handing, NP   50 mg at 01/26/21 0813   lithium carbonate (LITHOBID) CR tablet 300 mg  300 mg Oral Q12H Shawanda Sievert T, MD       magnesium hydroxide (MILK OF MAGNESIA) suspension 30 mL  30 mL Oral Daily PRN Waldon Merl F, NP   30 mL at 01/22/21 0803   multivitamin with minerals tablet 1 tablet  1 tablet Oral Daily Waldon Merl F, NP   1 tablet at 01/26/21 [id number]   naltrexone (DEPADE) tablet 50 mg  50 mg Oral Daily Emmylou Bieker, Madie Reno, MD   50 mg at 01/26/21 [id number]   pantoprazole (PROTONIX) EC tablet 40 mg  40 mg Oral Daily Waldon Merl F, NP   40 mg at 01/26/21 [id number]   QUEtiapine (SEROQUEL XR) 24 hr tablet 600 mg  600 mg Oral QHS Srihitha Tagliaferri T, MD   600 mg at 01/25/21 2108   QUEtiapine (SEROQUEL) tablet 50 mg  50 mg Oral TID Javon Snee, Madie Reno, MD   50 mg at 01/26/21 1205   senna-docusate (Senokot-S) tablet 2 tablet  2 tablet Oral Daily PRN Johnathen Testa, Madie Reno, MD   2 tablet at 01/23/21 1218    Lab Results:  Results for orders placed or performed during the hospital encounter of 01/18/21 (from the past 48 hour(s))  Lithium level     Status: None   Collection Time: 01/25/21  7:07 AM  Result Value Ref Range   Lithium Lvl 1.06 0.60 - 1.20 mmol/L    Comment: Performed at [facility], [address] [place], [place] [address]    Blood Alcohol level:  Lab Results  Component Value Date   ETH 121 (H) 01/17/2021   ETH <10 [id number]    Metabolic Disorder Labs: Lab Results  Component Value Date   HGBA1C 5.3 01/18/2021   MPG 105.41 01/18/2021   MPG 116.89 01/21/2019   No results found for: PROLACTIN Lab Results  Component Value Date   CHOL 202 (H) 01/18/2021    TRIG 172 (H) 01/18/2021   HDL 40 (L) 01/18/2021   CHOLHDL 5.1 01/18/2021   VLDL 34 01/18/2021   LDLCALC 128 (H) 01/18/2021   LDLCALC 98 01/07/2016    Physical Findings: AIMS:  , ,  ,  ,    CIWA:    COWS:     Musculoskeletal: Strength & Muscle Tone: within normal limits Gait & Station: normal Patient leans: N/A  Psychiatric Specialty Exam:  Presentation  General Appearance: Appropriate for Environment  Eye Contact:Fair  Speech:No data recorded Speech Volume:Normal  Handedness:No data recorded  Mood and Affect  Mood:Anxious; Depressed; Dysphoric  Affect:Blunt   Thought Process  Thought Processes:Coherent  Descriptions of Associations:Intact  Orientation:Full (Time, Place and Person)  Thought Content:WDL  History of Schizophrenia/Schizoaffective disorder:No  Duration of Psychotic Symptoms:Less than six months  Hallucinations:No data recorded Ideas of Reference:Paranoia  Suicidal Thoughts:No data recorded Homicidal Thoughts:No data recorded  Sensorium  Memory:Immediate Good  Judgment:Fair  Insight:Fair   Executive Functions  Concentration:Fair  Attention Span:Good  [place]   Psychomotor Activity  Psychomotor Activity:No data recorded  Assets  Assets:Desire for Improvement; Resilience   Sleep  Sleep:No data recorded   Physical Exam: Physical Exam Vitals and nursing note reviewed.  Constitutional:      Appearance: Normal appearance.  HENT:     Head: Normocephalic and atraumatic.     Mouth/Throat:     Pharynx: Oropharynx is clear.  Eyes:     Pupils: Pupils are equal, round, and reactive to light.  Cardiovascular:     Rate and Rhythm: Normal rate  and regular rhythm.  Pulmonary:     Effort: Pulmonary effort is normal.     Breath sounds: Normal breath sounds.  Abdominal:     General: Abdomen is flat.     Palpations: Abdomen is soft.  Musculoskeletal:        General: Normal range  of motion.  Skin:    General: Skin is warm and dry.  Neurological:     General: No focal deficit present.     Mental Status: He is alert. Mental status is at baseline.  Psychiatric:        Attention and Perception: Attention normal.        Mood and Affect: Mood normal.        Speech: Speech normal.        Behavior: Behavior is cooperative.        Thought Content: Thought content includes suicidal ideation. Thought content does not include suicidal plan.        Cognition and Memory: Cognition normal.   Review of Systems  Constitutional: Negative.   HENT: Negative.    Eyes: Negative.   Respiratory: Negative.    Cardiovascular: Negative.   Gastrointestinal: Negative.   Musculoskeletal: Negative.   Skin: Negative.   Neurological: Negative.   Psychiatric/Behavioral:  Positive for depression.   Blood pressure 114/69, pulse 84, temperature 98 F (36.7 C), temperature source Oral, resp. rate 17, height 5\' 9"  (1.753 m), weight 74.4 kg, SpO2 99 %. Body mass index is 24.22 kg/m.   Treatment Plan Summary: Decrease lithium dose to 300 mg twice a day because of blood level that was above 1.  No other changes to medicine.  Plan discharge 1 to 2 days.  Alethia Berthold, MD 01/26/2021, 3:45 PM

## 2021-01-26 NOTE — Progress Notes (Signed)
Patient alert and awake most of the shift. Participating in groups and eating all meals.  Endorses SI and AH. States, " the voices are still present telling me to hurt myself. " RN encouraged patient to speak with physician about concerns.   Patient denies HI and VI and pain.Rates anxiety a 5/10 and depression a 6/10.  Prns given throughout the day for agitation and anxiety.   Area to L knee completed healed and skin is intact.   Takes medications as prescribed.   Labs and vital signs monitored. Patient supported emotionally and encouraged to verbalize concerns.   Cont Q15 minute check for safety.

## 2021-01-26 NOTE — Plan of Care (Signed)
°  Problem: Coping: Goal: Ability to identify and develop effective coping behavior will improve Outcome: Progressing   Problem: Education: Goal: Knowledge of the prescribed therapeutic regimen will improve Outcome: Progressing   Problem: Education: Goal: Emotional status will improve Outcome: Progressing Goal: Mental status will improve Outcome: Progressing

## 2021-01-26 NOTE — Plan of Care (Signed)
See nursing notes Problem: Education: Goal: Ability to state activities that reduce stress will improve Outcome: Progressing   Problem: Coping: Goal: Ability to identify and develop effective coping behavior will improve Outcome: Progressing   Problem: Education: Goal: Utilization of techniques to improve thought processes will improve Outcome: Not Progressing Goal: Knowledge of the prescribed therapeutic regimen will improve Outcome: Progressing   Problem: Education: Goal: Knowledge of [place] [facility] Education information/materials will improve Outcome: Not Progressing Goal: Emotional status will improve Outcome: Progressing Goal: Mental status will improve Outcome: Progressing Goal: Verbalization of understanding the information provided will improve Outcome: Progressing   Problem: Education: Goal: Knowledge of [place] [facility] Education information/materials will improve Outcome: Not Progressing Goal: Emotional status will improve Outcome: Progressing Goal: Mental status will improve Outcome: Progressing Goal: Verbalization of understanding the information provided will improve Outcome: Progressing   Problem: Education: Goal: Ability to incorporate positive changes in behavior to improve self-esteem will improve Outcome: Progressing   Problem: Health Behavior/Discharge Planning: Goal: Ability to remain free from injury will improve Outcome: Progressing

## 2021-01-26 NOTE — Progress Notes (Signed)
Pleasant and cooperative he is active on the unit. Watching TV in the dayroom and interacting well with others.  He is med complaint and received his meds without incident. He denies si  hi  avh, but endorses anxiety,  depression and the urge to hurt his knee.  Cleaned knee and provided Band-Aid from where he had previously banged his knee on the wall.  Encouraged him to seek nursing staff with any concerns or urgent need to harm himself.  Will continue to monitor with q15 minute safety checks.     Rosalie Doctor, LPN

## 2021-01-26 NOTE — Progress Notes (Signed)
°   01/26/21 1450  Clinical Encounter Type  Visited With Patient  Visit Type Initial;Spiritual support;Social support  Spiritual Encounters  Spiritual Needs Emotional;Prayer   Chaplain Burris made initial visit with Pt. Chaplain B established a relationship of trust and care that fostered a lot of disclosure. Chaplain B provided active and reflective listening. Chaplain facilitated some self-reflection about strengths that can lay ground work for building resilience. A lot of work also done around issues of grief and loss. Pt finds helping others helps him to feel better so social interactions with others on the unit also a source of healing. Will f/u to explore alternatives to self-harm and ongoing steps to address grief.

## 2021-01-26 NOTE — Progress Notes (Signed)
Patient pleasant and cooperative. Reports feeling better and group has helped him. Denies SI, HI, AVH. Endorses depression and anxiety. Pt requests meds to assist with sleep. Given with good relief. No complaints or concerns voiced.  Encouragement and support provided. Safety checks maintained. Medications given as prescribed. Pt receptive and remains safe on unit with q 15 min checks.

## 2021-01-26 NOTE — Group Note (Unsigned)
[facility] LCSW Group Therapy Note ° ° °Group Date: 01/26/2021 °Start Time: 1300 °End Time: 1400 ° °Type of Therapy/Topic:  Group Therapy:  Feelings about Diagnosis ° °Participation Level:  {[facility] PARTICIPATION LEVEL:22264}  ° °Mood: *** ° ° °Description of Group:   ° This group will allow patients to explore their thoughts and feelings about diagnoses they have received. Patients will be guided to explore their level of understanding and acceptance of these diagnoses. Facilitator will encourage patients to process their thoughts and feelings about the reactions of others to their diagnosis, and will guide patients in identifying ways to discuss their diagnosis with significant others in their lives. This group will be process-oriented, with patients participating in exploration of their own experiences as well as giving and receiving support and challenge from other group members. ° ° °Therapeutic Goals: °1. Patient will demonstrate understanding of diagnosis as evidence by identifying two or more symptoms of the disorder:  °2. Patient will be able to express two feelings regarding the diagnosis °3. Patient will demonstrate ability to communicate their needs through discussion and/or role plays ° °Summary of Patient Progress: ° ° ° °*** ° ° ° °Therapeutic Modalities:   °Cognitive Behavioral Therapy °Brief Therapy °Feelings Identification  ° ° °Brennon Otterness, Student-Social Work °

## 2021-01-27 ENCOUNTER — Other Ambulatory Visit: Payer: Self-pay

## 2021-01-27 DIAGNOSIS — F332 Major depressive disorder, recurrent severe without psychotic features: Secondary | ICD-10-CM | POA: Diagnosis not present

## 2021-01-27 MED ORDER — GABAPENTIN 400 MG PO CAPS
400.0000 mg | ORAL_CAPSULE | Freq: Three times a day (TID) | ORAL | 0 refills | Status: DC
  Filled 2021-01-27: qty 30, 10d supply, fill #0

## 2021-01-27 MED ORDER — PANTOPRAZOLE SODIUM 40 MG PO TBEC
40.0000 mg | DELAYED_RELEASE_TABLET | Freq: Every day | ORAL | 0 refills | Status: DC
  Filled 2021-01-27: qty 10, 10d supply, fill #0

## 2021-01-27 MED ORDER — ASPIRIN 325 MG PO TBEC
325.0000 mg | DELAYED_RELEASE_TABLET | Freq: Every day | ORAL | 0 refills | Status: DC
  Filled 2021-01-27 (×2): qty 10, 10d supply, fill #0

## 2021-01-27 MED ORDER — ENALAPRIL MALEATE 10 MG PO TABS
10.0000 mg | ORAL_TABLET | Freq: Every day | ORAL | 0 refills | Status: DC
  Filled 2021-01-27: qty 10, 10d supply, fill #0

## 2021-01-27 MED ORDER — NALTREXONE HCL 50 MG PO TABS
50.0000 mg | ORAL_TABLET | Freq: Every day | ORAL | 0 refills | Status: DC
  Filled 2021-01-27: qty 10, 10d supply, fill #0

## 2021-01-27 MED ORDER — QUETIAPINE FUMARATE 50 MG PO TABS
50.0000 mg | ORAL_TABLET | Freq: Three times a day (TID) | ORAL | 0 refills | Status: DC
  Filled 2021-01-27: qty 30, 10d supply, fill #0

## 2021-01-27 MED ORDER — FLUOXETINE HCL 40 MG PO CAPS
80.0000 mg | ORAL_CAPSULE | Freq: Every day | ORAL | 0 refills | Status: DC
  Filled 2021-01-27: qty 20, 10d supply, fill #0

## 2021-01-27 MED ORDER — DOCUSATE SODIUM 100 MG PO CAPS
100.0000 mg | ORAL_CAPSULE | Freq: Two times a day (BID) | ORAL | 0 refills | Status: DC
  Filled 2021-01-27: qty 20, 10d supply, fill #0

## 2021-01-27 MED ORDER — LITHIUM CARBONATE ER 300 MG PO TBCR
300.0000 mg | EXTENDED_RELEASE_TABLET | Freq: Two times a day (BID) | ORAL | 0 refills | Status: DC
  Filled 2021-01-27: qty 20, 10d supply, fill #0

## 2021-01-27 MED ORDER — QUETIAPINE FUMARATE ER 300 MG PO TB24
600.0000 mg | ORAL_TABLET | Freq: Every day | ORAL | 0 refills | Status: DC
  Filled 2021-01-27: qty 10, 5d supply, fill #0

## 2021-01-27 MED ORDER — ASPIRIN 325 MG PO TABS
325.0000 mg | ORAL_TABLET | Freq: Every day | ORAL | Status: DC
  Administered 2021-01-27 – 2021-01-28 (×2): 325 mg via ORAL
  Filled 2021-01-27 (×2): qty 1

## 2021-01-27 NOTE — Progress Notes (Signed)
Recreation Therapy Notes   Date: 01/27/2021  Time: 10:50am    Location: Craft room    Behavioral response: Appropriate   Intervention Topic: Stress Management   Discussion/Intervention:  Group content on today was focused on stress. The group defined stress and way to cope with stress. Participants expressed how they know when they are stresses out. Individuals described the different ways they have to cope with stress. The group stated reasons why it is important to cope with stress. Patient explained what good stress is and some examples. The group participated in the intervention Stress Management. Individuals were separated into two group and answered questions related to stress.   Clinical Observations/Feedback: Patient came to group defined stress management as something that helps you maintain. He identified walking, running, exercise and using his stress ball as how he manages stress. Participant identified lots of noises as what stresses him out. Individual was social with peers and staff while participating in the intervention.  Dashaun Onstott LRT/CTRS         Egbert Seidel 01/27/2021 12:32 PM

## 2021-01-27 NOTE — Progress Notes (Addendum)
D: Pt awake and alert this day. Patient endorses visual hallucinations, auditory hallucinations. Per patient the voices are telling him to kill himself. However the voices have improved. Patient expresses visual hallucinations as flashes of visions, things he sees. However he admits those are better as well.   A: Pt reports speaking with the chaplin yesterday and today which has provided him with a lot of insight about his condition and helped him to understand he will benefit from counseling.   Patient rates anxiety 10/10 but reports depression as much better 4/10.  Takes medication per protocol and prn for anxiety and agitation.   R: Patient preparing for discharge on tomorrow. Pt safe on the unit. Will continue to monitor for safety.

## 2021-01-27 NOTE — Progress Notes (Signed)
[facility] MD Progress Note  01/27/2021 3:17 PM [name]  MRN:  [id number] Subjective: Follow-up for this patient with depression.  Patient seen and chart reviewed.  Patient reports mood is much better.  Still claims to at times have hallucinations but looks attentive and organized at all times and has not displayed any new dangerous behavior.  No suicidal ideation.  Tolerating medicines well.  Patient has gotten in touch with someone and says he should have a place to go tomorrow. Principal Problem: Severe recurrent major depression without psychotic features (HCC) Diagnosis: Principal Problem:   Severe recurrent major depression without psychotic features (HCC) Active Problems:   Nonsuicidal self-injury (HCC)   Essential hypertension   Alcohol abuse  Total Time spent with patient: 30 minutes  Past Psychiatric History: Past history of depression  Past Medical History:  Past Medical History:  Diagnosis Date   Anxiety    Arthritis    knees and hands   Bipolar disorder (HCC)    Depression    GERD (gastroesophageal reflux disease)    Hepatitis    HEP "C"   History of kidney stones    Hypertension    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 echo 5/17: EF 60-65%, no RWMA, LV dias fxn nl, LA mildly dilated, RV sys fxn nl, PASP nl, moderate sized circumferential pericardial effusion was identified, 2.12 cm around the LV free wall, <1 cm around the RV free wall. Features were not c/w tamponade physiology   PTSD (post-traumatic stress disorder)    Witnessed brother's suicide.   Restless leg syndrome    Syncope     Past Surgical History:  Procedure Laterality Date   CYSTOSCOPY WITH URETEROSCOPY AND STENT PLACEMENT     ESOPHAGOGASTRODUODENOSCOPY N/A 01/11/2016   Procedure: ESOPHAGOGASTRODUODENOSCOPY (EGD);  Surgeon: Charlott Rakes, MD;  Location: [facility] ENDOSCOPY;  Service: Endoscopy;  Laterality: N/A;    ESOPHAGOGASTRODUODENOSCOPY N/A 04/09/2020   Procedure: ESOPHAGOGASTRODUODENOSCOPY (EGD);  Surgeon: [name], MD;  Location: [facility] ENDOSCOPY;  Service: Gastroenterology;  Laterality: N/A;   INCISION AND DRAINAGE ABSCESS Left 01/02/2018   Procedure: INCISION AND DRAINAGE LEFT KNEE;  Surgeon: Deeann Saint, MD;  Location: [facility] ORS;  Service: Orthopedics;  Laterality: Left;   JOINT REPLACEMENT Right    TKR   KNEE ARTHROSCOPY Right 06/25/2014   Procedure: ARTHROSCOPY KNEE;  Surgeon: Deeann Saint, MD;  Location: [facility] ORS;  Service: Orthopedics;  Laterality: Right;  partial arthroscopic medial menisectomy   TOTAL KNEE ARTHROPLASTY Right 04/22/2015   Procedure: TOTAL KNEE ARTHROPLASTY;  Surgeon: Deeann Saint, MD;  Location: [facility] ORS;  Service: Orthopedics;  Laterality: Right;   TOTAL KNEE ARTHROPLASTY Left 10/30/2017   Procedure: TOTAL KNEE ARTHROPLASTY;  Surgeon: Deeann Saint, MD;  Location: [facility] ORS;  Service: Orthopedics;  Laterality: Left;   TOTAL KNEE REVISION Left 01/02/2018   Procedure: poly exchange of tibia and patella left knee;  Surgeon: Deeann Saint, MD;  Location: [facility] ORS;  Service: Orthopedics;  Laterality: Left;   Family History:  Family History  Problem Relation Age of Onset   CVA Mother        deceased at age 32   Depression Brother        Died by suicide at age 42   Family Psychiatric  History: See previous Social History:  Social History   Substance and Sexual Activity  Alcohol Use Yes   Comment: occ     Social History  Substance and Sexual Activity  Drug Use Yes   Frequency: 5.0 times per week   Types: Marijuana   Comment: marijuana    Social History   Socioeconomic History   Marital status: Single    Spouse name: Not on file   Number of children: Not on file   Years of education: Not on file   Highest education level: Not on file  Occupational History   Not on file  Tobacco Use   Smoking status: Former    Packs/day: 0.75    Years: 20.00     Pack years: 15.00    Types: Cigarettes    Quit date: 05/16/1984    Years since quitting: 36.7   Smokeless tobacco: Never  Vaping Use   Vaping Use: Never used  Substance and Sexual Activity   Alcohol use: Yes    Comment: occ   Drug use: Yes    Frequency: 5.0 times per week    Types: Marijuana    Comment: marijuana   Sexual activity: Not on file  Other Topics Concern   Not on file  Social History Narrative   Not on file   Social Determinants of Health   Financial Resource Strain: Not on file  Food Insecurity: Not on file  Transportation Needs: Not on file  Physical Activity: Not on file  Stress: Not on file  Social Connections: Not on file   Additional Social History:    Pain Medications: See PTA Prescriptions: See PTA Over the Counter: See PTA History of alcohol / drug use?: Yes Longest period of sobriety (when/how long): Unknown Negative Consequences of Use: Financial Name of Substance 1: Alcohol                  Sleep: Fair  Appetite:  Fair  Current Medications: Current Facility-Administered Medications  Medication Dose Route Frequency Provider Last Rate Last Admin   acetaminophen (TYLENOL) tablet 650 mg  650 mg Oral Q6H PRN Waldon Merl F, NP   650 mg at 01/21/21 0822   alum & mag hydroxide-simeth (MAALOX/MYLANTA) 200-200-20 MG/5ML suspension 30 mL  30 mL Oral Q4H PRN Waldon Merl F, NP       aspirin tablet 325 mg  325 mg Oral Daily Dorothe Pea, RPH   325 mg at 01/27/21 [id number]   docusate sodium (COLACE) capsule 100 mg  100 mg Oral BID Waldon Merl F, NP   100 mg at 01/27/21 0814   enalapril (VASOTEC) tablet 10 mg  10 mg Oral Daily Waldon Merl F, NP   10 mg at 01/27/21 0814   FLUoxetine (PROZAC) capsule 80 mg  80 mg Oral Daily Waldon Merl F, NP   80 mg at 01/27/21 0813   gabapentin (NEURONTIN) capsule 400 mg  400 mg Oral TID Waldon Merl F, NP   400 mg at 01/27/21 1230   haloperidol (HALDOL) tablet 5 mg  5 mg Oral Q6H PRN  Keilyn Haggard, Madie Reno, MD   5 mg at 01/27/21 1439   Or   haloperidol lactate (HALDOL) injection 5 mg  5 mg Intramuscular Q6H PRN Bessy Reaney, Madie Reno, MD   5 mg at 01/20/21 1436   hydrOXYzine (ATARAX) tablet 50 mg  50 mg Oral TID PRN Sherlon Handing, NP   50 mg at 01/27/21 1439   lithium carbonate (LITHOBID) CR tablet 300 mg  300 mg Oral Q12H Ceferino Lang T, MD   300 mg at 01/27/21 0814   magnesium hydroxide (MILK OF MAGNESIA) suspension 30 mL  30 mL Oral Daily PRN Waldon Merl F, NP   30 mL at 01/22/21 [id number]   multivitamin with minerals tablet 1 tablet  1 tablet Oral Daily Waldon Merl F, NP   1 tablet at 01/27/21 0814   naltrexone (DEPADE) tablet 50 mg  50 mg Oral Daily Shakerria Parran, Madie Reno, MD   50 mg at 01/27/21 0814   pantoprazole (PROTONIX) EC tablet 40 mg  40 mg Oral Daily Waldon Merl F, NP   40 mg at 01/27/21 [id number]   QUEtiapine (SEROQUEL XR) 24 hr tablet 600 mg  600 mg Oral QHS Comfort Iversen T, MD   600 mg at 01/26/21 2105   QUEtiapine (SEROQUEL) tablet 50 mg  50 mg Oral TID Harbor Vanover, Madie Reno, MD   50 mg at 01/27/21 1229   senna-docusate (Senokot-S) tablet 2 tablet  2 tablet Oral Daily PRN Deuce Paternoster, Madie Reno, MD   2 tablet at 01/23/21 1218    Lab Results: No results found for this or any previous visit (from the past 48 hour(s)).  Blood Alcohol level:  Lab Results  Component Value Date   ETH 121 (H) 01/17/2021   ETH <10 [id number]    Metabolic Disorder Labs: Lab Results  Component Value Date   HGBA1C 5.3 01/18/2021   MPG 105.41 01/18/2021   MPG 116.89 01/21/2019   No results found for: PROLACTIN Lab Results  Component Value Date   CHOL 202 (H) 01/18/2021   TRIG 172 (H) 01/18/2021   HDL 40 (L) 01/18/2021   CHOLHDL 5.1 01/18/2021   VLDL 34 01/18/2021   LDLCALC 128 (H) 01/18/2021   LDLCALC 98 01/07/2016    Physical Findings: AIMS:  , ,  ,  ,    CIWA:    COWS:     Musculoskeletal: Strength & Muscle Tone: within normal limits Gait & Station: normal Patient leans:  N/A  Psychiatric Specialty Exam:  Presentation  General Appearance: Appropriate for Environment  Eye Contact:Fair  Speech:No data recorded Speech Volume:Normal  Handedness:No data recorded  Mood and Affect  Mood:Anxious; Depressed; Dysphoric  Affect:Blunt   Thought Process  Thought Processes:Coherent  Descriptions of Associations:Intact  Orientation:Full (Time, Place and Person)  Thought Content:WDL  History of Schizophrenia/Schizoaffective disorder:No  Duration of Psychotic Symptoms:Less than six months  Hallucinations:No data recorded Ideas of Reference:Paranoia  Suicidal Thoughts:No data recorded Homicidal Thoughts:No data recorded  Sensorium  Memory:Immediate Good  Judgment:Fair  Insight:Fair   Executive Functions  Concentration:Fair  Attention Span:Good  Annetta North   Psychomotor Activity  Psychomotor Activity:No data recorded  Assets  Assets:Desire for Improvement; Resilience   Sleep  Sleep:No data recorded   Physical Exam: Physical Exam Vitals and nursing note reviewed.  Constitutional:      Appearance: Normal appearance.  HENT:     Head: Normocephalic and atraumatic.     Mouth/Throat:     Pharynx: Oropharynx is clear.  Eyes:     Pupils: Pupils are equal, round, and reactive to light.  Cardiovascular:     Rate and Rhythm: Normal rate and regular rhythm.  Pulmonary:     Effort: Pulmonary effort is normal.     Breath sounds: Normal breath sounds.  Abdominal:     General: Abdomen is flat.     Palpations: Abdomen is soft.  Musculoskeletal:        General: Normal range of motion.  Skin:    General: Skin is warm and dry.  Neurological:     General: No focal deficit  present.     Mental Status: He is alert. Mental status is at baseline.  Psychiatric:        Attention and Perception: Attention normal.        Mood and Affect: Mood normal.        Speech: Speech normal.         Behavior: Behavior normal.        Thought Content: Thought content normal.        Cognition and Memory: Cognition normal.   Review of Systems  Constitutional: Negative.   HENT: Negative.    Eyes: Negative.   Respiratory: Negative.    Cardiovascular: Negative.   Gastrointestinal: Negative.   Musculoskeletal: Negative.   Skin: Negative.   Neurological: Negative.   Psychiatric/Behavioral: Negative.    Blood pressure 114/82, pulse 80, temperature 97.6 F (36.4 C), temperature source Oral, resp. rate 17, height 5\' 9"  (1.753 m), weight 74.4 kg, SpO2 100 %. Body mass index is 24.22 kg/m.   Treatment Plan Summary: Medication management and Plan 10-day supply of medications will be ordered from the pharmacy and arrangements will begin for likely discharge tomorrow with referral to outpatient mental health treatment.  Alethia Berthold, MD 01/27/2021, 3:17 PM

## 2021-01-27 NOTE — Group Note (Unsigned)
[facility] LCSW Group Therapy Note ° ° °Group Date: 01/27/2021 °Start Time: 1300 °End Time: 1400 ° ° °Type of Therapy/Topic:  Group Therapy:  Emotion Regulation ° °Participation Level:  {[facility] PARTICIPATION LEVEL:22264}  ° °Mood: ° °Description of Group:   ° The purpose of this group is to assist patients in learning to regulate negative emotions and experience positive emotions. Patients will be guided to discuss ways in which they have been vulnerable to their negative emotions. These vulnerabilities will be juxtaposed with experiences of positive emotions or situations, and patients challenged to use positive emotions to combat negative ones. Special emphasis will be placed on coping with negative emotions in conflict situations, and patients will process healthy conflict resolution skills. ° °Therapeutic Goals: °1. Patient will identify two positive emotions or experiences to reflect on in order to balance out negative emotions:  °2. Patient will label two or more emotions that they find the most difficult to experience:  °3. Patient will be able to demonstrate positive conflict resolution skills through discussion or role plays:  ° °Summary of Patient Progress: ° ° °*** ° ° ° °Therapeutic Modalities:   °Cognitive Behavioral Therapy °Feelings Identification °Dialectical Behavioral Therapy ° ° °Orlean Holtrop F Skanda Worlds, Student-Social Work °

## 2021-01-28 ENCOUNTER — Other Ambulatory Visit: Payer: Self-pay

## 2021-01-28 DIAGNOSIS — F332 Major depressive disorder, recurrent severe without psychotic features: Secondary | ICD-10-CM | POA: Diagnosis not present

## 2021-01-28 MED ORDER — QUETIAPINE FUMARATE 50 MG PO TABS: 50.0000 mg | ORAL_TABLET | Freq: Three times a day (TID) | ORAL | 1 refills | Status: DC

## 2021-01-28 MED ORDER — LITHIUM CARBONATE ER 300 MG PO TBCR: 300.0000 mg | EXTENDED_RELEASE_TABLET | Freq: Two times a day (BID) | ORAL | 1 refills | Status: DC

## 2021-01-28 MED ORDER — ASPIRIN 325 MG PO TBEC: 325.0000 mg | DELAYED_RELEASE_TABLET | Freq: Every day | ORAL | 1 refills | Status: DC

## 2021-01-28 MED ORDER — NALTREXONE HCL 50 MG PO TABS: 50.0000 mg | ORAL_TABLET | Freq: Every day | ORAL | 1 refills | Status: DC

## 2021-01-28 MED ORDER — DOCUSATE SODIUM 100 MG PO CAPS
100.0000 mg | ORAL_CAPSULE | Freq: Two times a day (BID) | ORAL | 1 refills | Status: DC
Start: 2021-01-28 — End: 2021-05-12

## 2021-01-28 MED ORDER — PANTOPRAZOLE SODIUM 40 MG PO TBEC: 40.0000 mg | DELAYED_RELEASE_TABLET | Freq: Every day | ORAL | 1 refills | Status: DC

## 2021-01-28 MED ORDER — QUETIAPINE FUMARATE ER 300 MG PO TB24: 600.0000 mg | ORAL_TABLET | Freq: Every day | ORAL | 1 refills | Status: DC

## 2021-01-28 MED ORDER — FLUOXETINE HCL 40 MG PO CAPS: 80.0000 mg | ORAL_CAPSULE | Freq: Every day | ORAL | 1 refills | Status: DC

## 2021-01-28 MED ORDER — GABAPENTIN 400 MG PO CAPS: 400.0000 mg | ORAL_CAPSULE | Freq: Three times a day (TID) | ORAL | 1 refills | Status: DC

## 2021-01-28 NOTE — Progress Notes (Signed)
Recreation Therapy Notes  INPATIENT RECREATION TR PLAN  [name] MRN: [id number] DOB: 02/19/1960 Today's Date: 01/28/2021  Rec Therapy Plan Is patient appropriate for Therapeutic Recreation?: Yes Treatment times per week: at least 3 Estimated Length of Stay: 5-7 days TR Treatment/Interventions: Group participation (Comment)  Discharge Criteria Pt will be discharged from therapy if:: Discharged Treatment plan/goals/alternatives discussed and agreed upon by:: Patient/family  Discharge Summary Short term goals set: Patient will identify 3 positive coping skills strategies to use post d/c for self-harm within 5 recreation therapy group sessions Short term goals met: Complete Progress toward goals comments: Groups attended Which groups?: Self-esteem, Stress management, Goal setting, Other (Comment) (Relaxation, Time Management) Reason goals not met: N/A Therapeutic equipment acquired: N/A Reason patient discharged from therapy: Discharge from hospital Pt/family agrees with progress & goals achieved: Yes Date patient discharged from therapy: 01/28/21   Labaron Digirolamo 01/28/2021, 12:06 PM

## 2021-01-28 NOTE — Progress Notes (Signed)
Recreation Therapy Notes  Date: 01/28/2021  Time: 10:15am    Location: Courtyard     Behavioral response: N/A   Intervention Topic: Leisure    Discussion/Intervention: Patient did not attend group.   Clinical Observations/Feedback:  Patient did not attend group.    Aviah Sorci LRT/CTRS          Jadakiss Barish 01/28/2021 11:58 AM

## 2021-01-28 NOTE — Progress Notes (Signed)
°   01/28/21 1230  Clinical Encounter Type  Visited With Patient  Visit Type Follow-up;Spiritual support;Social support  Spiritual Encounters  Spiritual Needs Prayer   Chaplain Burris provided f/u support at Pt's request. Chaplain B provided a non-anxious, compassionate presence as Pt expressed some nervousness ahead of d/c. Chaplain B provided active and reflective listening and facilitated meaning-making process. Chaplain B also offered prayer at Pt request and words of encouragement. Specifically, chaplain offered some suggestions for continued processing of thoughts and feelings (Pt shared that writing has been helpful) as well as utilizing spirituality to maintain balance and support sobriety. Also Pt showed good awareness and intention to utilize community resources.

## 2021-01-28 NOTE — Plan of Care (Signed)
Problem: Coping Skills Goal: STG - Patient will identify 3 positive coping skills strategies to use post d/c for self-harm within 5 recreation therapy group sessions Description: STG - Patient will identify 3 positive coping skills strategies to use post d/c for self-harm within 5 recreation therapy group sessions Outcome: Completed/Met   

## 2021-01-28 NOTE — Progress Notes (Signed)
Patient denies SI/HI AVH at this time. Discharge instructions, AVS, prescriptions and transition record gone over with patient. Patient agrees to comply with medication management, follow-up visit, and outpatient therapy. Patient belongings returned to patient. Patient questions and concerns addressed and answered.  Patient ambulatory off unit by staff. Left via safe transport.  Patient discharged to home with friend.

## 2021-01-28 NOTE — Progress Notes (Signed)
Patient alert and awake this morning. Visible on the unit and interacting appropriately with staff and peers. Rates anxiety and depression a 5/10 and pain 4/10 to knee. Does not want medication. Reports feeling much better.  Denies SI/HI/AVH.   Takes medications as prescribed. Labs and vital sign monitored. Patient supported emotionally and encouraged to verbalize concerns.   Plan is for discharge today. Will cont to monitor for safety.

## 2021-01-28 NOTE — Progress Notes (Addendum)
°  [facility] Adult Case Management Discharge Plan :  Will you be returning to the same living situation after discharge:  No, pt expressed that he is going to his friends place.  At discharge, do you have transportation home?: Yes, CSW will assist with transportation. Pt signed transportation waiver for [place] to get them to their destination.  Do you have the ability to pay for your medications: Yes,  [place] Medicaid   Release of information consent forms completed and in the chart;  Patient's signature needed at discharge.  Patient to Follow up at:  Follow-up Information     [facility] Follow up.   Why: Appointment is scheduled for 02/01/2021 at 9:30AM.  Thanks! Contact information: [address] [place] [address] [phone]                 Next level of care provider has access to [facility] Link:no  Safety Planning and Suicide Prevention discussed: No, pt refused to sign consent. SPE completed with patient.      Has patient been referred to the Quitline?: Patient refused referral  Patient has been referred for addiction treatment: Yes, referring him to [facility] for follow-up.   Rosezella [place], Student-Social Work 01/28/2021, 10:16 AM

## 2021-01-28 NOTE — Plan of Care (Signed)
°  Problem: Education: Goal: Ability to state activities that reduce stress will improve Outcome: Adequate for Discharge   Problem: Coping: Goal: Ability to identify and develop effective coping behavior will improve Outcome: Adequate for Discharge   Problem: Education: Goal: Utilization of techniques to improve thought processes will improve Outcome: Adequate for Discharge Goal: Knowledge of the prescribed therapeutic regimen will improve Outcome: Adequate for Discharge   Problem: Education: Goal: Knowledge of [facility] Education information/materials will improve Outcome: Adequate for Discharge Goal: Emotional status will improve Outcome: Adequate for Discharge Goal: Mental status will improve Outcome: Adequate for Discharge Goal: Verbalization of understanding the information provided will improve Outcome: Adequate for Discharge   Problem: Education: Goal: Ability to incorporate positive changes in behavior to improve self-esteem will improve Outcome: Adequate for Discharge   Problem: Health Behavior/Discharge Planning: Goal: Ability to remain free from injury will improve Outcome: Adequate for Discharge

## 2021-01-28 NOTE — Progress Notes (Signed)
Pleasant and easy to engage in conversation. Reports feeling better than he has in days.  Reports being ready for discharge. Med compliant with no incidents. Denies si hi avh continues to endorse anxiety and depression, but states its manageable.      Cleo Butler-Nicholson, LPN

## 2021-01-28 NOTE — [facility] Suicide Risk Assessment (Signed)
[facility] Discharge Suicide Risk Assessment   Principal Problem: Severe recurrent major depression without psychotic features [facility]) Discharge Diagnoses: Principal Problem:   Severe recurrent major depression without psychotic features (HCC) Active Problems:   Nonsuicidal self-injury (HCC)   Essential hypertension   Alcohol abuse   Total Time spent with patient: 30 minutes  Musculoskeletal: Strength & Muscle Tone: within normal limits Gait & Station: normal Patient leans: N/A  Psychiatric Specialty Exam  Presentation  General Appearance: Appropriate for Environment  Eye Contact:Fair  Speech:No data recorded Speech Volume:Normal  Handedness:No data recorded  Mood and Affect  Mood:Anxious; Depressed; Dysphoric  Duration of Depression Symptoms: Greater than two weeks  Affect:Blunt   Thought Process  Thought Processes:Coherent  Descriptions of Associations:Intact  Orientation:Full (Time, Place and Person)  Thought Content:WDL  History of Schizophrenia/Schizoaffective disorder:No  Duration of Psychotic Symptoms:Less than six months  Hallucinations:No data recorded Ideas of Reference:Paranoia  Suicidal Thoughts:No data recorded Homicidal Thoughts:No data recorded  Sensorium  Memory:Immediate Good  Judgment:Fair  Insight:Fair   Executive Functions  Concentration:Fair  Attention Span:Good  Recall:Good  Fund of Knowledge:Fair  Language:Fair   Psychomotor Activity  Psychomotor Activity:No data recorded  Assets  Assets:Desire for Improvement; Resilience   Sleep  Sleep:No data recorded  Physical Exam: Physical Exam Vitals and nursing note reviewed.  Constitutional:      Appearance: Normal appearance.  HENT:     Head: Normocephalic and atraumatic.     Mouth/Throat:     Pharynx: Oropharynx is clear.  Eyes:     Pupils: Pupils are equal, round, and reactive to light.  Cardiovascular:     Rate and Rhythm: Normal rate and regular rhythm.   Pulmonary:     Effort: Pulmonary effort is normal.     Breath sounds: Normal breath sounds.  Abdominal:     General: Abdomen is flat.     Palpations: Abdomen is soft.  Musculoskeletal:        General: Normal range of motion.  Skin:    General: Skin is warm and dry.  Neurological:     General: No focal deficit present.     Mental Status: He is alert. Mental status is at baseline.  Psychiatric:        Attention and Perception: Attention normal.        Mood and Affect: Mood normal.        Speech: Speech normal.        Behavior: Behavior normal.        Thought Content: Thought content normal.        Cognition and Memory: Cognition normal.        Judgment: Judgment normal.   Review of Systems  Constitutional: Negative.   HENT: Negative.    Eyes: Negative.   Respiratory: Negative.    Cardiovascular: Negative.   Gastrointestinal: Negative.   Musculoskeletal: Negative.   Skin: Negative.   Neurological: Negative.   Psychiatric/Behavioral: Negative.    Blood pressure 113/78, pulse 88, temperature 97.8 F (36.6 C), temperature source Oral, resp. rate 17, height 5\' 9"  (1.753 m), weight 74.4 kg, SpO2 100 %. Body mass index is 24.22 kg/m.  Mental Status Per Nursing Assessment::   On Admission:  Suicidal ideation indicated by patient  Demographic Factors:  Male, Caucasian, and Low socioeconomic status  Loss Factors: Loss of significant relationship and Decline in physical health  Historical Factors: Prior suicide attempts and Impulsivity  Risk Reduction Factors:   Positive therapeutic relationship  Continued Clinical Symptoms:  Depression:   Impulsivity  Cognitive Features That Contribute To Risk:  None    Suicide Risk:  Minimal: No identifiable suicidal ideation.  Patients presenting with no risk factors but with morbid ruminations; may be classified as minimal risk based on the severity of the depressive symptoms   Follow-up Information     [facility]  Follow up.   Why: Appointment is scheduled for 02/01/2021 at 9:30AM.  Thanks! Contact information: [address] [place] [address] [phone]                 Plan Of Care/Follow-up recommendations:  Patient discharged at this time denying any suicidal ideation.  Affect brighter and more upbeat.  Positive plans for the future.  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  Patient will be referred for local follow up with [facility] and continued on current medicine  Mordecai Rasmussen, MD 01/28/2021, 10:13 AM

## 2021-01-28 NOTE — Discharge Summary (Signed)
Physician Discharge Summary Note  Patient:  [name] is an 61 y.o., male MRN:  [id number] DOB:  1960-06-22 Patient phone:  [phone] (home)  Patient address:   [place] [address],  Total Time spent with patient: 30 minutes  Date of Admission:  01/18/2021 Date of Discharge: 01/28/2021  Reason for Admission: Patient was admitted after presenting with self-injury and intrusive thoughts and suicidal ideation  Principal Problem: Severe recurrent major depression without psychotic features [facility]) Discharge Diagnoses: Principal Problem:   Severe recurrent major depression without psychotic features ([place]) Active Problems:   Nonsuicidal self-injury ([place])   Essential hypertension   Alcohol abuse   Past Psychiatric History: History of alcohol abuse and chronic depression and mood instability  Past Medical History:  Past Medical History:  Diagnosis Date   Anxiety    Arthritis    knees and hands   Bipolar disorder ([place])    Depression    GERD (gastroesophageal reflux disease)    Hepatitis    HEP "C"   History of kidney stones    Hypertension    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 echo 5/17: EF 60-65%, no RWMA, LV dias fxn nl, LA mildly dilated, RV sys fxn nl, PASP nl, moderate sized circumferential pericardial effusion was identified, 2.12 cm around the LV free wall, <1 cm around the RV free wall. Features were not c/w tamponade physiology   PTSD (post-traumatic stress disorder)    Witnessed brother's suicide.   Restless leg syndrome    Syncope     Past Surgical History:  Procedure Laterality Date   CYSTOSCOPY WITH URETEROSCOPY AND STENT PLACEMENT     ESOPHAGOGASTRODUODENOSCOPY N/A 01/11/2016   Procedure: ESOPHAGOGASTRODUODENOSCOPY (EGD);  Surgeon: Wilford Corner, MD;  Location: [facility] ENDOSCOPY;  Service: Endoscopy;  Laterality: N/A;   ESOPHAGOGASTRODUODENOSCOPY N/A 04/09/2020   Procedure:  ESOPHAGOGASTRODUODENOSCOPY (EGD);  Surgeon: Jonathon Bellows, MD;  Location: [facility] ENDOSCOPY;  Service: Gastroenterology;  Laterality: N/A;   INCISION AND DRAINAGE ABSCESS Left 01/02/2018   Procedure: INCISION AND DRAINAGE LEFT KNEE;  Surgeon: Earnestine Leys, MD;  Location: [facility] ORS;  Service: Orthopedics;  Laterality: Left;   JOINT REPLACEMENT Right    TKR   KNEE ARTHROSCOPY Right 06/25/2014   Procedure: ARTHROSCOPY KNEE;  Surgeon: Earnestine Leys, MD;  Location: [facility] ORS;  Service: Orthopedics;  Laterality: Right;  partial arthroscopic medial menisectomy   TOTAL KNEE ARTHROPLASTY Right 04/22/2015   Procedure: TOTAL KNEE ARTHROPLASTY;  Surgeon: Earnestine Leys, MD;  Location: [facility] ORS;  Service: Orthopedics;  Laterality: Right;   TOTAL KNEE ARTHROPLASTY Left 10/30/2017   Procedure: TOTAL KNEE ARTHROPLASTY;  Surgeon: Earnestine Leys, MD;  Location: [facility] ORS;  Service: Orthopedics;  Laterality: Left;   TOTAL KNEE REVISION Left 01/02/2018   Procedure: poly exchange of tibia and patella left knee;  Surgeon: Earnestine Leys, MD;  Location: [facility] ORS;  Service: Orthopedics;  Laterality: Left;   Family History:  Family History  Problem Relation Age of Onset   CVA Mother        deceased at age 62   Depression Brother        Died by suicide at age 53   Family Psychiatric  History: See previous.  Had a brother who died by suicide Social History:  Social History   Substance and Sexual Activity  Alcohol Use Yes   Comment: occ     Social History   Substance and Sexual Activity  Drug  Use Yes   Frequency: 5.0 times per week   Types: Marijuana   Comment: marijuana    Social History   Socioeconomic History   Marital status: Single    Spouse name: Not on file   Number of children: Not on file   Years of education: Not on file   Highest education level: Not on file  Occupational History   Not on file  Tobacco Use   Smoking status: Former    Packs/day: 0.75    Years: 20.00    Pack years: 15.00     Types: Cigarettes    Quit date: 05/16/1984    Years since quitting: 36.7   Smokeless tobacco: Never  Vaping Use   Vaping Use: Never used  Substance and Sexual Activity   Alcohol use: Yes    Comment: occ   Drug use: Yes    Frequency: 5.0 times per week    Types: Marijuana    Comment: marijuana   Sexual activity: Not on file  Other Topics Concern   Not on file  Social History Narrative   Not on file   Social Determinants of Health   Financial Resource Strain: Not on file  Food Insecurity: Not on file  Transportation Needs: Not on file  Physical Activity: Not on file  Stress: Not on file  Social Connections: Not on file    Hospital Course: Patient admitted to psychiatric unit.  Patient was kept on 15-minute checks.  He did show some tendency to self injury having injured his knee more than once and at 1 point split open a cut on the front of it that required consultation with surgery.  Surgery close the wound with Steri-Strips and advised the patient about the risk of further self injury.  After that he had no further episodes of self-injury.  Mood and behavior were treated with medication and therapy.  Added naltrexone as a possible treatment for self-injury as this medicine has been shown to have some benefit for this behavior in the past.  Started lithium also for suicidal ideation and self-injury.  Level was checked and was slightly high so the dose has been cut back to 300 mg twice a day.  Increase Seroquel to current 600 mg at night and 3 times a day 50 mg dosage.  Patient gradually showed improvement in reported mood and became more forthcoming and came out of his room and attended groups more.  Made an effort to find some outpatient plan.  At this time denies suicidal ideation.  Less distressed although still at times complaining of what he interprets as hallucinations.  Agrees to follow-up at local mental health agency.  Strongly encouraged to avoid drinking and all other drugs at  discharge  Physical Findings: AIMS:  , ,  ,  ,    CIWA:    COWS:     Musculoskeletal: Strength & Muscle Tone: within normal limits Gait & Station: normal Patient leans: N/A   Psychiatric Specialty Exam:  Presentation  General Appearance: Appropriate for Environment  Eye Contact:Fair  Speech:No data recorded Speech Volume:Normal  Handedness:No data recorded  Mood and Affect  Mood:Anxious; Depressed; Dysphoric  Affect:Blunt   Thought Process  Thought Processes:Coherent  Descriptions of Associations:Intact  Orientation:Full (Time, Place and Person)  Thought Content:WDL  History of Schizophrenia/Schizoaffective disorder:No  Duration of Psychotic Symptoms:Less than six months  Hallucinations:No data recorded Ideas of Reference:Paranoia  Suicidal Thoughts:No data recorded Homicidal Thoughts:No data recorded  Sensorium  Memory:Immediate Good  Judgment:Fair  Insight:Fair   Executive Functions  Concentration:Fair  Attention Span:Good  [place]   Psychomotor Activity  Psychomotor Activity:No data recorded  Assets  Assets:Desire for Improvement; Resilience   Sleep  Sleep:No data recorded   Physical Exam: Physical Exam Constitutional:      Appearance: Normal appearance.  HENT:     Head: Normocephalic and atraumatic.     Mouth/Throat:     Pharynx: Oropharynx is clear.  Eyes:     Pupils: Pupils are equal, round, and reactive to light.  Cardiovascular:     Rate and Rhythm: Normal rate and regular rhythm.  Pulmonary:     Effort: Pulmonary effort is normal.     Breath sounds: Normal breath sounds.  Abdominal:     General: Abdomen is flat.     Palpations: Abdomen is soft.  Musculoskeletal:        General: Normal range of motion.  Skin:    General: Skin is warm and dry.  Neurological:     General: No focal deficit present.     Mental Status: He is alert. Mental status is at baseline.   Psychiatric:        Mood and Affect: Mood normal.        Thought Content: Thought content normal.   Review of Systems  Constitutional: Negative.   HENT: Negative.    Eyes: Negative.   Respiratory: Negative.    Cardiovascular: Negative.   Gastrointestinal: Negative.   Musculoskeletal: Negative.   Skin: Negative.   Neurological: Negative.   Psychiatric/Behavioral: Negative.    Blood pressure 113/78, pulse 88, temperature 97.8 F (36.6 C), temperature source Oral, resp. rate 17, height 5\' 9"  (1.753 m), weight 74.4 kg, SpO2 100 %. Body mass index is 24.22 kg/m.   Social History   Tobacco Use  Smoking Status Former   Packs/day: 0.75   Years: 20.00   Pack years: 15.00   Types: Cigarettes   Quit date: 05/16/1984   Years since quitting: 36.7  Smokeless Tobacco Never   Tobacco Cessation:  N/A, patient does not currently use tobacco products   Blood Alcohol level:  Lab Results  Component Value Date   ETH 121 (H) 01/17/2021   ETH <10 123XX123    Metabolic Disorder Labs:  Lab Results  Component Value Date   HGBA1C 5.3 01/18/2021   MPG 105.41 01/18/2021   MPG 116.89 01/21/2019   No results found for: PROLACTIN Lab Results  Component Value Date   CHOL 202 (H) 01/18/2021   TRIG 172 (H) 01/18/2021   HDL 40 (L) 01/18/2021   CHOLHDL 5.1 01/18/2021   VLDL 34 01/18/2021   LDLCALC 128 (H) 01/18/2021   [place] 98 01/07/2016    See Psychiatric Specialty Exam and Suicide Risk Assessment completed by Attending Physician prior to discharge.  Discharge destination:  Home  Is patient on multiple antipsychotic therapies at discharge:  No   Has Patient had three or more failed trials of antipsychotic monotherapy by history:  No  Recommended Plan for Multiple Antipsychotic Therapies: NA  Discharge Instructions     Diet - low sodium heart healthy   Complete by: As directed    Increase activity slowly   Complete by: As directed    No wound care   Complete by: As  directed       Allergies as of 01/28/2021       Reactions   Toradol [ketorolac Tromethamine] Hives        Medication List  STOP taking these medications    benztropine 1 MG tablet Commonly known as: COGENTIN   diphenoxylate-atropine 2.5-0.025 MG tablet Commonly known as: LOMOTIL   divalproex 500 MG 24 hr tablet Commonly known as: DEPAKOTE ER   divalproex 500 MG DR tablet Commonly known as: DEPAKOTE   multivitamin with minerals Tabs tablet   oxyCODONE 5 MG immediate release tablet Commonly known as: Roxicodone   polyethylene glycol 17 g packet Commonly known as: MIRALAX / GLYCOLAX   traMADol 50 MG tablet Commonly known as: ULTRAM       TAKE these medications      Indication  aspirin 325 MG EC tablet Take 1 tablet (325 mg total) by mouth daily.  Indication: Stable Angina Pectoris   docusate sodium 100 MG capsule Commonly known as: Colace Take 1 capsule (100 mg total) by mouth 2 (two) times daily.  Indication: Constipation   enalapril 10 MG tablet Commonly known as: VASOTEC Take 1 tablet (10 mg total) by mouth daily.  Indication: High Blood Pressure Disorder   FLUoxetine 40 MG capsule Commonly known as: PROZAC Take 2 capsules (80 mg total) by mouth daily.  Indication: Depression   gabapentin 400 MG capsule Commonly known as: NEURONTIN Take 1 capsule (400 mg total) by mouth 3 (three) times daily.  Indication: Social Anxiety Disorder   lithium carbonate 300 MG CR tablet Commonly known as: LITHOBID Take 1 tablet (300 mg total) by mouth every 12 (twelve) hours.  Indication: Major Depressive Disorder   naltrexone 50 MG tablet Commonly known as: DEPADE Take 1 tablet (50 mg total) by mouth daily.  Indication: self injury   pantoprazole 40 MG tablet Commonly known as: Protonix Take 1 tablet (40 mg total) by mouth daily.  Indication: Gastroesophageal Reflux Disease   QUEtiapine 300 MG 24 hr tablet Commonly known as: SEROQUEL XR Take 2  tablets (600 mg total) by mouth at bedtime. What changed:  medication strength how much to take  Indication: Major Depressive Disorder   QUEtiapine 50 MG tablet Commonly known as: SEROQUEL Take 1 tablet (50 mg total) by mouth 3 (three) times daily. What changed:  when to take this reasons to take this  Indication: Major Depressive Disorder        Follow-up [place] Follow up.   Why: Appointment is scheduled for 02/01/2021 at 9:30AM.  Thanks! Contact information: [place] [address] [phone]                 Follow-up recommendations: Follow-up with local mental health agency [facility].  Continue current medicines.  Do not resume alcohol or any drug use.  Comments: Prescriptions provided.  Also provided 10-day supply at discharge to hold him over and try to ensure compliance.  Signed: Alethia Berthold, MD 01/28/2021, 10:18 AM

## 2021-01-29 ENCOUNTER — Emergency Department: Payer: Medicaid Other

## 2021-01-29 ENCOUNTER — Encounter: Payer: Self-pay | Admitting: Emergency Medicine

## 2021-01-29 ENCOUNTER — Emergency Department
Admission: EM | Admit: 2021-01-29 | Discharge: 2021-01-30 | Disposition: A | Discharge status: Home / Self Care | Payer: Medicaid Other | Attending: Emergency Medicine | Admitting: Emergency Medicine

## 2021-01-29 ENCOUNTER — Other Ambulatory Visit: Payer: Self-pay

## 2021-01-29 DIAGNOSIS — Z96653 Presence of artificial knee joint, bilateral: Secondary | ICD-10-CM | POA: Insufficient documentation

## 2021-01-29 DIAGNOSIS — F319 Bipolar disorder, unspecified: Secondary | ICD-10-CM | POA: Diagnosis present

## 2021-01-29 DIAGNOSIS — Z20822 Contact with and (suspected) exposure to covid-19: Secondary | ICD-10-CM | POA: Diagnosis not present

## 2021-01-29 DIAGNOSIS — Z87891 Personal history of nicotine dependence: Secondary | ICD-10-CM | POA: Insufficient documentation

## 2021-01-29 DIAGNOSIS — R45851 Suicidal ideations: Secondary | ICD-10-CM | POA: Insufficient documentation

## 2021-01-29 DIAGNOSIS — G251 Drug-induced tremor: Secondary | ICD-10-CM | POA: Diagnosis present

## 2021-01-29 DIAGNOSIS — I10 Essential (primary) hypertension: Secondary | ICD-10-CM | POA: Insufficient documentation

## 2021-01-29 DIAGNOSIS — R4589 Other symptoms and signs involving emotional state: Secondary | ICD-10-CM

## 2021-01-29 DIAGNOSIS — R4588 Nonsuicidal self-harm: Secondary | ICD-10-CM | POA: Diagnosis not present

## 2021-01-29 DIAGNOSIS — F3130 Bipolar disorder, current episode depressed, mild or moderate severity, unspecified: Secondary | ICD-10-CM | POA: Diagnosis present

## 2021-01-29 DIAGNOSIS — R569 Unspecified convulsions: Secondary | ICD-10-CM | POA: Insufficient documentation

## 2021-01-29 DIAGNOSIS — Z7982 Long term (current) use of aspirin: Secondary | ICD-10-CM | POA: Insufficient documentation

## 2021-01-29 LAB — LACTIC ACID, PLASMA: Lactic Acid, Venous: 0.6 mmol/L (ref 0.5–1.9)

## 2021-01-29 LAB — URINALYSIS, ROUTINE W REFLEX MICROSCOPIC
Bilirubin Urine: NEGATIVE
Glucose, UA: NEGATIVE mg/dL
Hgb urine dipstick: NEGATIVE
Ketones, ur: NEGATIVE mg/dL
Leukocytes,Ua: NEGATIVE
Nitrite: NEGATIVE
Protein, ur: NEGATIVE mg/dL
Specific Gravity, Urine: 1.005 (ref 1.005–1.030)
pH: 5 (ref 5.0–8.0)

## 2021-01-29 LAB — URINE DRUG SCREEN, QUALITATIVE (ARMC ONLY)
Amphetamines, Ur Screen: NOT DETECTED
Barbiturates, Ur Screen: NOT DETECTED
Benzodiazepine, Ur Scrn: NOT DETECTED
Cannabinoid 50 Ng, Ur ~~LOC~~: POSITIVE — AB
Cocaine Metabolite,Ur ~~LOC~~: NOT DETECTED
MDMA (Ecstasy)Ur Screen: NOT DETECTED
Methadone Scn, Ur: NOT DETECTED
Opiate, Ur Screen: NOT DETECTED
Phencyclidine (PCP) Ur S: NOT DETECTED
Tricyclic, Ur Screen: POSITIVE — AB

## 2021-01-29 LAB — CK: Total CK: 285 U/L (ref 49–397)

## 2021-01-29 LAB — BASIC METABOLIC PANEL
Anion gap: 8 (ref 5–15)
Anion gap: 6 (ref 5–15)
BUN: 36 mg/dL — ABNORMAL HIGH (ref 6–20)
BUN: 29 mg/dL — ABNORMAL HIGH (ref 6–20)
CO2: 22 mmol/L (ref 22–32)
CO2: 21 mmol/L — ABNORMAL LOW (ref 22–32)
Calcium: 9.4 mg/dL (ref 8.9–10.3)
Calcium: 8.6 mg/dL — ABNORMAL LOW (ref 8.9–10.3)
Chloride: 99 mmol/L (ref 98–111)
Chloride: 108 mmol/L (ref 98–111)
Creatinine, Ser: 2.05 mg/dL — ABNORMAL HIGH (ref 0.61–1.24)
Creatinine, Ser: 1.53 mg/dL — ABNORMAL HIGH (ref 0.61–1.24)
GFR, Estimated: 37 mL/min — ABNORMAL LOW (ref >60)
GFR, Estimated: 52 mL/min — ABNORMAL LOW (ref >60)
Glucose, Bld: 114 mg/dL — ABNORMAL HIGH (ref 70–99)
Glucose, Bld: 140 mg/dL — ABNORMAL HIGH (ref 70–99)
Potassium: 4.2 mmol/L (ref 3.5–5.1)
Potassium: 3.7 mmol/L (ref 3.5–5.1)
Sodium: 129 mmol/L — ABNORMAL LOW (ref 135–145)
Sodium: 135 mmol/L (ref 135–145)

## 2021-01-29 LAB — CBC
HCT: 37.5 % — ABNORMAL LOW (ref 39.0–52.0)
Hemoglobin: 12.8 g/dL — ABNORMAL LOW (ref 13.0–17.0)
MCH: 29.5 pg (ref 26.0–34.0)
MCHC: 34.1 g/dL (ref 30.0–36.0)
MCV: 86.4 fL (ref 80.0–100.0)
Platelets: 262 10*3/uL (ref 150–400)
RBC: 4.34 MIL/uL (ref 4.22–5.81)
RDW: 15.9 % — ABNORMAL HIGH (ref 11.5–15.5)
WBC: 9.2 10*3/uL (ref 4.0–10.5)
nRBC: 0 % (ref 0.0–0.2)

## 2021-01-29 LAB — RESP PANEL BY RT-PCR (FLU A&B, COVID) ARPGX2
Influenza A by PCR: NEGATIVE
Influenza B by PCR: NEGATIVE
SARS Coronavirus 2 by RT PCR: NEGATIVE

## 2021-01-29 LAB — LITHIUM LEVEL: Lithium Lvl: 0.96 mmol/L (ref 0.60–1.20)

## 2021-01-29 MED ORDER — QUETIAPINE FUMARATE 25 MG PO TABS
50.0000 mg | ORAL_TABLET | Freq: Three times a day (TID) | ORAL | Status: DC
  Administered 2021-01-29 – 2021-01-30 (×2): 50 mg via ORAL
  Filled 2021-01-29 (×2): qty 2

## 2021-01-29 MED ORDER — GABAPENTIN 300 MG PO CAPS
400.0000 mg | ORAL_CAPSULE | Freq: Three times a day (TID) | ORAL | Status: DC
  Administered 2021-01-29 – 2021-01-30 (×2): 400 mg via ORAL
  Filled 2021-01-29 (×2): qty 1

## 2021-01-29 MED ORDER — DOCUSATE SODIUM 100 MG PO CAPS
100.0000 mg | ORAL_CAPSULE | Freq: Two times a day (BID) | ORAL | Status: DC
  Administered 2021-01-29 – 2021-01-30 (×2): 100 mg via ORAL
  Filled 2021-01-29 (×2): qty 1

## 2021-01-29 MED ORDER — NALTREXONE HCL 50 MG PO TABS
50.0000 mg | ORAL_TABLET | Freq: Every day | ORAL | Status: DC
  Administered 2021-01-30: 50 mg via ORAL
  Filled 2021-01-29: qty 1

## 2021-01-29 MED ORDER — LITHIUM CARBONATE ER 300 MG PO TBCR
300.0000 mg | EXTENDED_RELEASE_TABLET | Freq: Two times a day (BID) | ORAL | Status: DC
  Administered 2021-01-29 – 2021-01-30 (×2): 300 mg via ORAL
  Filled 2021-01-29 (×3): qty 1

## 2021-01-29 MED ORDER — PANTOPRAZOLE SODIUM 40 MG PO TBEC
40.0000 mg | DELAYED_RELEASE_TABLET | Freq: Every day | ORAL | Status: DC
  Administered 2021-01-30: 40 mg via ORAL
  Filled 2021-01-29: qty 1

## 2021-01-29 MED ORDER — ENALAPRIL MALEATE 10 MG PO TABS
10.0000 mg | ORAL_TABLET | Freq: Every day | ORAL | Status: DC
  Administered 2021-01-30: 10 mg via ORAL
  Filled 2021-01-29: qty 1

## 2021-01-29 MED ORDER — TOPIRAMATE 25 MG PO TABS
25.0000 mg | ORAL_TABLET | Freq: Every day | ORAL | Status: DC
  Administered 2021-01-29 – 2021-01-30 (×2): 25 mg via ORAL
  Filled 2021-01-29 (×2): qty 1

## 2021-01-29 MED ORDER — FLUOXETINE HCL 20 MG PO CAPS
40.0000 mg | ORAL_CAPSULE | Freq: Two times a day (BID) | ORAL | Status: DC
  Administered 2021-01-29 – 2021-01-30 (×2): 40 mg via ORAL
  Filled 2021-01-29 (×2): qty 2

## 2021-01-29 MED ORDER — SODIUM CHLORIDE 0.9 % IV BOLUS
1000.0000 mL | Freq: Once | INTRAVENOUS | Status: AC
  Administered 2021-01-29: 1000 mL via INTRAVENOUS

## 2021-01-29 MED ORDER — ACETAMINOPHEN 325 MG PO TABS
650.0000 mg | ORAL_TABLET | Freq: Once | ORAL | Status: AC
Start: 2021-01-29 — End: 2021-01-29
  Administered 2021-01-29: 650 mg via ORAL
  Filled 2021-01-29: qty 2

## 2021-01-29 MED ORDER — QUETIAPINE FUMARATE ER 300 MG PO TB24
600.0000 mg | ORAL_TABLET | Freq: Every day | ORAL | Status: DC
  Administered 2021-01-29: 600 mg via ORAL
  Filled 2021-01-29 (×2): qty 2

## 2021-01-29 NOTE — Consult Note (Signed)
[facility] Face-to-Face Psychiatry Consult   Reason for Consult:  seizure  Referring Physician:  EDP Patient Identification: [name] MRN:  [id number] Principal Diagnosis: bipolar depression Diagnosis:  Active Problems:   Bipolar depression (HCC)  Total Time spent with patient: 1 hour  Subjective:   [name] is a 61 y.o. male patient admitted with seizure like activity.  HPI:  61 yo male with bipolar d/o who was discharged yesterday from the inpatient unit after 10 days.  He is scheduled to move into a boarding house on the [place], living with friends until then which is not going well.  Presented to the ED with reporting seizures, eight total because he says it was being off of Depakote.  Despite being off of Depakote during inpatient and no history of seizures noted.  There are notes of self-harm behaviors of intentionally falling on the unit during admission to prolong his stay.  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  He is at his baseline with no suicidal/homicidal ideations, hallucinations, or substance abuse.  Added a low dose of Topamax and discharge in the am which he is aware.    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 echo 5/17: EF 60-65%, no RWMA, LV dias fxn nl, LA mildly dilated, RV sys fxn nl, PASP nl, moderate sized circumferential pericardial effusion was identified, 2.12  cm around the LV free wall, <1 cm around the RV free wall. Features were not c/w tamponade physiology   PTSD (post-traumatic stress disorder)    Witnessed brother's suicide.   Restless leg syndrome    Syncope     Past Surgical History:  Procedure Laterality Date   CYSTOSCOPY WITH URETEROSCOPY AND STENT PLACEMENT     ESOPHAGOGASTRODUODENOSCOPY N/A 01/11/2016   Procedure: ESOPHAGOGASTRODUODENOSCOPY (EGD);  Surgeon: Charlott Rakes, MD;  Location: [facility] ENDOSCOPY;  Service: Endoscopy;  Laterality: N/A;   ESOPHAGOGASTRODUODENOSCOPY N/A 04/09/2020   Procedure: ESOPHAGOGASTRODUODENOSCOPY (EGD);  Surgeon: [name], MD;  Location: [facility] ENDOSCOPY;  Service: Gastroenterology;  Laterality: N/A;   INCISION AND DRAINAGE ABSCESS Left 01/02/2018   Procedure: INCISION AND DRAINAGE LEFT KNEE;  Surgeon: Deeann Saint, MD;  Location: [facility] ORS;  Service: Orthopedics;  Laterality: Left;   JOINT REPLACEMENT Right    TKR   KNEE ARTHROSCOPY Right 06/25/2014   Procedure: ARTHROSCOPY KNEE;  Surgeon: Deeann Saint, MD;  Location: [facility] ORS;  Service: Orthopedics;  Laterality: Right;  partial arthroscopic medial menisectomy   TOTAL KNEE ARTHROPLASTY Right 04/22/2015   Procedure: TOTAL KNEE ARTHROPLASTY;  Surgeon: Deeann Saint, MD;  Location: [facility] ORS;  Service: Orthopedics;  Laterality: Right;   TOTAL KNEE ARTHROPLASTY Left 10/30/2017   Procedure: TOTAL KNEE ARTHROPLASTY;  Surgeon: Deeann Saint, MD;  Location: [facility] ORS;  Service: Orthopedics;  Laterality: Left;   TOTAL KNEE REVISION Left 01/02/2018  Procedure: poly exchange of tibia and patella left knee;  Surgeon: Earnestine Leys, MD;  Location: [facility] ORS;  Service: Orthopedics;  Laterality: Left;   Family History:  Family History  Problem Relation Age of Onset   CVA Mother        deceased at age 7   Depression Brother        Died by suicide at age 53   Family Psychiatric  History: see above Social History:  Social History   Substance and Sexual Activity   Alcohol Use Yes     Social History   Substance and Sexual Activity  Drug Use Yes   Types: Marijuana    Social History   Socioeconomic History   Marital status: Single    Spouse name: Not on file   Number of children: Not on file   Years of education: Not on file   Highest education level: Not on file  Occupational History   Not on file  Tobacco Use   Smoking status: Former    Packs/day: 0.75    Years: 20.00    Pack years: 15.00    Types: Cigarettes    Quit date: 05/16/1984    Years since quitting: 36.7   Smokeless tobacco: Never  Vaping Use   Vaping Use: Never used  Substance and Sexual Activity   Alcohol use: Yes   Drug use: Yes    Types: Marijuana   Sexual activity: Not on file  Other Topics Concern   Not on file  Social History Narrative   Not on file   Social Determinants of Health   Financial Resource Strain: Not on file  Food Insecurity: Not on file  Transportation Needs: Not on file  Physical Activity: Not on file  Stress: Not on file  Social Connections: Not on file   Additional Social History:    Allergies:   Allergies  Allergen Reactions   Toradol [Ketorolac Tromethamine] Hives    Labs:  Results for orders placed or performed during the hospital encounter of 01/29/21 (from the past 48 hour(s))  CBC - if new onset seizures     Status: Abnormal   Collection Time: 01/29/21  3:32 PM  Result Value Ref Range   WBC 9.2 4.0 - 10.5 K/uL   RBC 4.34 4.22 - 5.81 MIL/uL   Hemoglobin 12.8 (L) 13.0 - 17.0 g/dL   HCT 37.5 (L) 39.0 - 52.0 %   MCV 86.4 80.0 - 100.0 fL   MCH 29.5 26.0 - 34.0 pg   MCHC 34.1 30.0 - 36.0 g/dL   RDW 15.9 (H) 11.5 - 15.5 %   Platelets 262 150 - 400 K/uL   nRBC 0.0 0.0 - 0.2 %    Comment: Performed at [facility], [address] [place], [place] [id number]  Basic metabolic panel - if new onset seizures     Status: Abnormal   Collection Time: 01/29/21  3:32 PM  Result Value Ref Range   Sodium 129 (L) 135 - 145  mmol/L   Potassium 4.2 3.5 - 5.1 mmol/L   Chloride 99 98 - 111 mmol/L   CO2 22 22 - 32 mmol/L   Glucose, Bld 114 (H) 70 - 99 mg/dL    Comment: Glucose reference range applies only to samples taken after fasting for at least 8 hours.   BUN 36 (H) 6 - 20 mg/dL   Creatinine, Ser 2.05 (H) 0.61 - 1.24 mg/dL   Calcium 9.4 8.9 - 10.3 mg/dL   GFR,  Estimated 37 (L) >60 mL/min    Comment: (NOTE) Calculated using the CKD-EPI Creatinine Equation (2021)    Anion gap 8 5 - 15    Comment: Performed at [facility], [address] [place], [place] [address]  Lithium level     Status: None   Collection Time: 01/29/21  3:34 PM  Result Value Ref Range   Lithium Lvl 0.96 0.60 - 1.20 mmol/L    Comment: Performed at [facility], [address] [place], [place] [address]    Current Facility-Administered Medications  Medication Dose Route Frequency Provider Last Rate Last Admin   docusate sodium (COLACE) capsule 100 mg  100 mg Oral BID Patrecia Pour, NP       enalapril (VASOTEC) tablet 10 mg  10 mg Oral Daily Patrecia Pour, NP       FLUoxetine (PROZAC) capsule 40 mg  40 mg Oral BID Patrecia Pour, NP       gabapentin (NEURONTIN) capsule 400 mg  400 mg Oral TID Patrecia Pour, NP       lithium carbonate (LITHOBID) CR tablet 300 mg  300 mg Oral Q12H Patrecia Pour, NP       naltrexone (DEPADE) tablet 50 mg  50 mg Oral Daily Patrecia Pour, NP       pantoprazole (PROTONIX) EC tablet 40 mg  40 mg Oral Daily Patrecia Pour, NP       QUEtiapine (SEROQUEL XR) 24 hr tablet 600 mg  600 mg Oral QHS Patrecia Pour, NP       QUEtiapine (SEROQUEL) tablet 50 mg  50 mg Oral TID Patrecia Pour, NP       topiramate (TOPAMAX) tablet 25 mg  25 mg Oral Daily Patrecia Pour, NP       Current Outpatient Medications  Medication Sig Dispense Refill   aspirin 325 MG EC tablet Take 1 tablet (325 mg total) by mouth daily. 30 tablet 1   docusate sodium (COLACE) 100 MG capsule Take 1 capsule (100  mg total) by mouth 2 (two) times daily. 60 capsule 1   enalapril (VASOTEC) 10 MG tablet Take 1 tablet (10 mg total) by mouth daily. 10 tablet 0   FLUoxetine (PROZAC) 40 MG capsule Take 2 capsules (80 mg total) by mouth daily. 60 capsule 1   gabapentin (NEURONTIN) 400 MG capsule Take 1 capsule (400 mg total) by mouth 3 (three) times daily. 90 capsule 1   lithium carbonate (LITHOBID) 300 MG CR tablet Take 1 tablet (300 mg total) by mouth every 12 (twelve) hours. 60 tablet 1   naltrexone (DEPADE) 50 MG tablet Take 1 tablet (50 mg total) by mouth daily. 30 tablet 1   pantoprazole (PROTONIX) 40 MG tablet Take 1 tablet (40 mg total) by mouth daily. 30 tablet 1   QUEtiapine (SEROQUEL XR) 300 MG 24 hr tablet Take 2 tablets (600 mg total) by mouth at bedtime. 60 tablet 1   QUEtiapine (SEROQUEL) 50 MG tablet Take 1 tablet (50 mg total) by mouth 3 (three) times daily. 90 tablet 1    Musculoskeletal: Strength & Muscle Tone: within normal limits Gait & Station: normal Patient leans: N/A  Psychiatric Specialty Exam: Physical Exam Vitals and nursing note reviewed.  Constitutional:      Appearance: Normal appearance.  HENT:     Head: Normocephalic.     Nose: Nose normal.  Pulmonary:     Effort: Pulmonary effort is normal.  Musculoskeletal:  General: Normal range of motion.     Cervical back: Normal range of motion.  Neurological:     General: No focal deficit present.     Mental Status: He is alert and oriented to person, place, and time.  Psychiatric:        Attention and Perception: Attention and perception normal.        Mood and Affect: Mood is anxious.        Speech: Speech normal.        Behavior: Behavior normal. Behavior is cooperative.        Thought Content: Thought content normal.        Cognition and Memory: Cognition normal.        Judgment: Judgment normal.    Review of Systems  Psychiatric/Behavioral:  The patient is nervous/anxious.   All other systems reviewed and  are negative.  Blood pressure (!) 115/98, pulse 73, temperature 98.8 F (37.1 C), temperature source Oral, resp. rate 19, height 6' (1.829 m), weight 74.4 kg, SpO2 98 %.Body mass index is 22.24 kg/m.  General Appearance: Casual  Eye Contact:  Good  Speech:  Normal Rate  Volume:  Normal  Mood:  Anxious  Affect:  Congruent  Thought Process:  Coherent and Descriptions of Associations: Intact  Orientation:  Full (Time, Place, and Person)  Thought Content:  WDL and Logical  Suicidal Thoughts:  No  Homicidal Thoughts:  No  Memory:  Immediate;   Good Recent;   Good Remote;   Good  Judgement:  Fair  Insight:  Fair  Psychomotor Activity:  Normal  Concentration:  Concentration: Good and Attention Span: Good  Recall:  Good  Fund of Knowledge:  Good  Language:  Good  Akathisia:  No  Handed:  Right  AIMS (if indicated):     Assets:  Housing Leisure Time Physical Health Resilience Social Support  ADL's:  Intact  Cognition:  WNL  Sleep:        Physical Exam: Physical Exam Vitals and nursing note reviewed.  Constitutional:      Appearance: Normal appearance.  HENT:     Head: Normocephalic.     Nose: Nose normal.  Pulmonary:     Effort: Pulmonary effort is normal.  Musculoskeletal:        General: Normal range of motion.     Cervical back: Normal range of motion.  Neurological:     General: No focal deficit present.     Mental Status: He is alert and oriented to person, place, and time.  Psychiatric:        Attention and Perception: Attention and perception normal.        Mood and Affect: Mood is anxious.        Speech: Speech normal.        Behavior: Behavior normal. Behavior is cooperative.        Thought Content: Thought content normal.        Cognition and Memory: Cognition normal.        Judgment: Judgment normal.   Review of Systems  Psychiatric/Behavioral:  The patient is nervous/anxious.   All other systems reviewed and are negative. Blood pressure (!) 115/98,  pulse 73, temperature 98.8 F (37.1 C), temperature source Oral, resp. rate 19, height 6' (1.829 m), weight 74.4 kg, SpO2 98 %. Body mass index is 22.24 kg/m.  Treatment Plan Summary: Bipolar affective disorder, remission: -Continue Lithium 300 mg BID -Seroquel 600 mg at bedtime and 50 mg TID -Started Topamax  25 mg daily -Prozac 80 mg daily  Anxiety: Continue gabapentin 400 mg TID  Alcohol use disorder: Naltrexone 50 mg daily  Disposition: No evidence of imminent risk to self or others at present.   Patient does not meet criteria for psychiatric inpatient admission.  Waylan Boga, NP 01/29/2021 5:16 PM

## 2021-01-29 NOTE — ED Notes (Signed)
Pt given sandwich tray and a cup of sprite.  °

## 2021-01-29 NOTE — ED Triage Notes (Addendum)
Pt in via [facility] from home, states, "I had 8 seizures today and my buddy was in there, it scared the shit out of him so he called EMS."  Patient A/Ox4, tremors noted bilaterally to upper and lower extremities.  Does reports recent hospitalization due to same with medication change from Depakote to Lithium.  Reports since starting the Lithium has had constant tremors, and increase in anxiety.  Pt does report hitting head when he fell, denies blood thinners.  Also reports pain to left shoulder from fall.  NAD noted at this time.

## 2021-01-29 NOTE — ED Notes (Signed)
Hospital meal provided.  100% consumed, pt tolerated w/o complaints.  Waste discarded appropriately.   

## 2021-01-29 NOTE — ED Notes (Signed)
VOL, pend consult 

## 2021-01-29 NOTE — ED Provider Notes (Addendum)
[facility] Provider Note    Event Date/Time   First MD Initiated Contact with Patient 01/29/21 1553     (approximate)   History   Seizures and Medication Reaction   HPI  [name] is a 61 y.o. male with a history of major depression, anxiety, GERD, hepatitis C, hypertension and apparent history of seizure disorder presents with multiple seizures today, witnessed by his roommate.  The patient states that he passes out and is unsure how long seizures loss.  He states he has had seizures for years and was on Depakote.  He was admitted this month for suicidal ideation and discharged yesterday.  During his admission, the Depakote was discontinued and he was started on lithium.  The patient states that since starting on the lithium he has been very tremulous.  He also states that his suicidal ideations have returned although he has not attempted to harm himself.   Physical Exam   Triage Vital Signs: ED Triage Vitals  Enc Vitals Group     BP 01/29/21 1525 (!) 115/98     Pulse Rate 01/29/21 1525 73     Resp 01/29/21 1525 19     Temp 01/29/21 1525 98.8 F (37.1 C)     Temp Source 01/29/21 1525 Oral     SpO2 01/29/21 1525 98 %     Weight 01/29/21 1530 164 lb (74.4 kg)     Height 01/29/21 1530 6' (1.829 m)     Head Circumference --      Peak Flow --      Pain Score 01/29/21 1530 7     Pain Loc --      Pain Edu? --      Excl. in GC? --     Most recent vital signs: Vitals:   01/29/21 1741 01/29/21 1843  BP: (!) 122/99 (!) 93/59  Pulse: 88 89  Resp: 18 16  Temp:    SpO2: 96% 97%     General: Awake, oriented x4, no distress.  CV:  Good peripheral perfusion.  Resp:  Normal effort.  Abd:  No distention.  Other:  EOMI.  PERRLA.  Motor intact in all extremities.  Mild diffuse tremor.   ED Results / Procedures / Treatments   Labs (all labs ordered are listed, but only abnormal results are displayed) Labs Reviewed  CBC - Abnormal; Notable  for the following components:      Result Value   Hemoglobin 12.8 (*)    HCT 37.5 (*)    RDW 15.9 (*)    All other components within normal limits  BASIC METABOLIC PANEL - Abnormal; Notable for the following components:   Sodium 129 (*)    Glucose, Bld 114 (*)    BUN 36 (*)    Creatinine, Ser 2.05 (*)    GFR, Estimated 37 (*)    All other components within normal limits  URINALYSIS, ROUTINE W REFLEX MICROSCOPIC - Abnormal; Notable for the following components:   Color, Urine YELLOW (*)    APPearance CLEAR (*)    All other components within normal limits  URINE DRUG SCREEN, QUALITATIVE ([facility] ONLY) - Abnormal; Notable for the following components:   Tricyclic, Ur Screen POSITIVE (*)    Cannabinoid 50 Ng, Ur Eddy POSITIVE (*)    All other components within normal limits  BASIC METABOLIC PANEL - Abnormal; Notable for the following components:   CO2 21 (*)    Glucose, Bld 140 (*)  BUN 29 (*)    Creatinine, Ser 1.53 (*)    Calcium 8.6 (*)    GFR, Estimated 52 (*)    All other components within normal limits  RESP PANEL BY RT-PCR (FLU A&B, COVID) ARPGX2  LITHIUM LEVEL  LACTIC ACID, PLASMA  CK     EKG  ED ECG REPORT I, Dionne Bucy, the attending physician, personally viewed and interpreted this ECG.  Date: 01/29/2021 EKG Time: 1528 Rate: 87 Rhythm: normal sinus rhythm QRS Axis: normal Intervals: normal ST/T Wave abnormalities: Nonspecific T wave abnormalities Narrative Interpretation: no evidence of acute ischemia    RADIOLOGY  XR L shoulder interpreted by me shows no acute fracture or other acute abnormality  PROCEDURES:  Critical Care performed: No  Procedures   MEDICATIONS ORDERED IN ED: Medications  docusate sodium (COLACE) capsule 100 mg (100 mg Oral Given 01/29/21 2154)  enalapril (VASOTEC) tablet 10 mg (has no administration in time range)  FLUoxetine (PROZAC) capsule 40 mg (40 mg Oral Given 01/29/21 2154)  gabapentin (NEURONTIN) capsule 400 mg  (400 mg Oral Given 01/29/21 2154)  lithium carbonate (LITHOBID) CR tablet 300 mg (300 mg Oral Given 01/29/21 2152)  naltrexone (DEPADE) tablet 50 mg (has no administration in time range)  pantoprazole (PROTONIX) EC tablet 40 mg (has no administration in time range)  QUEtiapine (SEROQUEL XR) 24 hr tablet 600 mg (600 mg Oral Given 01/29/21 2153)  QUEtiapine (SEROQUEL) tablet 50 mg (50 mg Oral Given 01/29/21 2154)  topiramate (TOPAMAX) tablet 25 mg (25 mg Oral Given 01/29/21 2005)  sodium chloride 0.9 % bolus 1,000 mL (0 mLs Intravenous Stopped 01/29/21 1838)  sodium chloride 0.9 % bolus 1,000 mL (0 mLs Intravenous Stopped 01/29/21 2109)     IMPRESSION / MDM / ASSESSMENT AND PLAN / ED COURSE  I reviewed the triage vital signs and the nursing notes.  61 year old male with PMH as noted above including apparent seizure disorder on Depakote presents with multiple seizures today after being discharged from the hospital yesterday.  I reviewed the past medical records including the discharge summary from Dr. Toni Amend from yesterday.  During the patient's admission he was started on lithium and the dose was adjusted due to an elevated lithium level.  Depakote was discontinued although it is not clear from the notes why this was done.  Differential diagnosis includes, but is not limited to, epileptic seizures, pseudoseizure, electrolyte abnormality other metabolic cause, medication side effect, lithium toxicity.  We will obtain lab work-up, neurology and psychiatry consults, and reassess.  The patient is on the cardiac monitor to evaluate for evidence of arrhythmia and/or significant heart rate changes.  __________________________  The patient has been placed in psychiatric observation due to the need to provide a safe environment for the patient while obtaining psychiatric consultation and evaluation, as well as ongoing medical and medication management to treat the patient's condition.  The patient has not  been placed under full IVC at this time.   ----------------------------------------- 7:14 PM on 01/29/2021 -----------------------------------------  I consulted NP Lord from psychiatry who recommends adding Topamax and overnight observation in the ED, but he does not meet inpatient criteria for psychiatry at this time..  The patient does not have a documented history of known seizures, and the seizure-like episodes that he reports are apparently thought to be psychogenic in nature.  I also consulted Dr. Selina Cooley from neurology; based on our discussion there is no indication for acute intervention.  Lab work-up is significant for AKI with an elevated creatinine  of 2.0 and borderline hyponatremia.  Other labs are unremarkable.  The lithium level is normal.  Overall I suspect AKI due to mild dehydration.  We will give fluids and repeat the labs.  I considered whether the patient may require admission, however if these abnormalities clear with fluids and there is no indication for seizure work-up, I anticipate that the patient will not require inpatient admission.  ----------------------------------------- 10:44 PM on 01/29/2021 -----------------------------------------  Repeat BMP shows significant improvement in the creatinine to 1.5 and normalization of the sodium.  There is no indication for medical admission at that time.  Plan will be psych reassessment in the morning.   FINAL CLINICAL IMPRESSION(S) / ED DIAGNOSES   Final diagnoses:  Seizure-like activity (HCC)  Thoughts of self harm     Rx / DC Orders   ED Discharge Orders     None        Note:  This document was prepared using Dragon voice recognition software and may include unintentional dictation errors.    Dionne Bucy, MD 01/29/21 1165    Dionne Bucy, MD 01/29/21 2245

## 2021-01-29 NOTE — ED Triage Notes (Signed)
First Nurse Note:  ARrives via [facility] for evaluation for seizure.  Patient reports having 6-8 sz today, blackouts, falls.  Recent med change from depakote to lithium.  Extensive psychiatric history.  Patient c/o increased delusion, voices in head.  Voices telling him to harm self.  Arrives from home.  Vs wnl.

## 2021-01-30 ENCOUNTER — Encounter: Payer: Self-pay | Admitting: Emergency Medicine

## 2021-01-30 ENCOUNTER — Other Ambulatory Visit: Payer: Self-pay

## 2021-01-30 ENCOUNTER — Emergency Department (EMERGENCY_DEPARTMENT_HOSPITAL)
Admission: EM | Admit: 2021-01-30 | Discharge: 2021-01-31 | Disposition: A | Discharge status: Home / Self Care | Payer: Medicaid Other | Source: Home / Self Care | Attending: Emergency Medicine | Admitting: Emergency Medicine

## 2021-01-30 DIAGNOSIS — Z96653 Presence of artificial knee joint, bilateral: Secondary | ICD-10-CM | POA: Insufficient documentation

## 2021-01-30 DIAGNOSIS — R4589 Other symptoms and signs involving emotional state: Secondary | ICD-10-CM

## 2021-01-30 DIAGNOSIS — F319 Bipolar disorder, unspecified: Secondary | ICD-10-CM | POA: Insufficient documentation

## 2021-01-30 DIAGNOSIS — Z87891 Personal history of nicotine dependence: Secondary | ICD-10-CM | POA: Insufficient documentation

## 2021-01-30 DIAGNOSIS — I10 Essential (primary) hypertension: Secondary | ICD-10-CM | POA: Insufficient documentation

## 2021-01-30 DIAGNOSIS — Z7982 Long term (current) use of aspirin: Secondary | ICD-10-CM | POA: Insufficient documentation

## 2021-01-30 DIAGNOSIS — R45851 Suicidal ideations: Secondary | ICD-10-CM | POA: Insufficient documentation

## 2021-01-30 DIAGNOSIS — R4588 Nonsuicidal self-harm: Secondary | ICD-10-CM | POA: Diagnosis present

## 2021-01-30 MED ORDER — TOPIRAMATE 25 MG PO TABS: 25.0000 mg | ORAL_TABLET | Freq: Every day | ORAL | 0 refills | Status: DC

## 2021-01-30 MED ORDER — LORAZEPAM 2 MG PO TABS
2.0000 mg | ORAL_TABLET | Freq: Once | ORAL | Status: AC
  Administered 2021-01-30: 2 mg via ORAL
  Filled 2021-01-30: qty 1

## 2021-01-30 NOTE — ED Notes (Signed)
Patient states he wants to hurt himself but he doesn't want to kill himself.  Patient states that he hit himself with a rock on his left knee.

## 2021-01-30 NOTE — ED Triage Notes (Addendum)
Pt via POV from home. Pt was discharged from [facility] this morning where he was psych cleared. Pt states he waited at the bus stop and nobody came and so he decided to come back. Per Dr Cinda Quest, no blood work or urine at this time. States that he wants to "hurt himself" but not "kill himself"  Pt is A&OX4 and NAD. States that he hit his R knee on a rock.

## 2021-01-30 NOTE — ED Notes (Addendum)
This RN verified with Dr. Darnelle Catalan no blood work or urine needed at this time due to pt just being discharged from ED. Triage RN made aware.

## 2021-01-30 NOTE — ED Provider Notes (Signed)
Emergency Medicine Observation Re-evaluation Note  [name] is a 61 y.o. male, seen on rounds today.  Pt initially presented to the ED for complaints of Seizures and Medication Reaction Currently, the patient is resting.  Physical Exam  BP 104/70 (BP Location: Right Arm)    Pulse 87    Temp 98.8 F (37.1 C) (Oral)    Resp 15    Ht 1.829 m (6')    Wt 74.4 kg    SpO2 92%    BMI 22.24 kg/m  Physical Exam Gen:  No acute distress Resp:  Breathing easily and comfortably, no accessory muscle usage Neuro:  Moving all four extremities, no gross focal neuro deficits Psych:  Resting currently, calm when awake  ED Course / MDM    I have reviewed the labs performed to date as well as medications administered while in observation.  Recent changes in the last 24 hours include initial evaluation by EDP and psychiatry..  They do not feel he meets inpatient criteria but reportedly said that the patient would be reassessed this morning to determine disposition.  He complained of some "shaking" overnight and I suspect this is at least partially volitional, but I ordered Ativan 2 mg IV because he said he was getting "worked up" and having "bad thoughts".  Plan  Current plan is for psychiatric reassessment.  [name] is not under involuntary commitment.     Loleta Rose, MD 01/30/21 [phone]

## 2021-01-30 NOTE — Consult Note (Signed)
Client continues to psychiatrically stable with no suicidal/homicidal ideations, hallucinations, seizures, or other concerns.  He will need a bus pass, psychiatrically stable for discharge.  Nanine Means, PMHNP

## 2021-01-30 NOTE — ED Notes (Signed)
VOL/pending reassessment in the AM 

## 2021-01-30 NOTE — ED Notes (Signed)
Per pts request, pt received a cold tray and a drink

## 2021-01-30 NOTE — ED Notes (Signed)
Pt ambulatory to bathroom with steady gait.

## 2021-01-30 NOTE — ED Provider Notes (Signed)
Patient seen and evaluated by psychiatric team and cleared for outpatient management.  No medical barriers to outpatient management.  Will discharge with return precautions.   Delton Prairie, MD 01/30/21 [phone]

## 2021-01-30 NOTE — ED Notes (Signed)
Pt given breakfast tray, is asleep at this time.  

## 2021-01-31 DIAGNOSIS — R4588 Nonsuicidal self-harm: Secondary | ICD-10-CM

## 2021-01-31 NOTE — ED Notes (Signed)
Pt discharged home.  VS stable. Pt denies SI.

## 2021-01-31 NOTE — ED Provider Notes (Signed)
[facility] Provider Note    Event Date/Time   First MD Initiated Contact with Patient 01/31/21 0720     (approximate)   History   Suicidal   HPI  Anjelo Colon Harnisch is a 61 y.o. male with a history of bipolar disorder, hypertension, PTSD who presents for evaluation of the self-harm thoughts.  Patient denies any suicidal thoughts but reports that he feels like hurting himself to the point that he sees blood.  He reports that it makes him feel better.  He was here yesterday and discharge.  He reports that he sat by the bus stop all day but did not see his boss therefore he got angry, grabbed a rock and started hitting himself in the knee and to his saw blood.  That made him feel better but then he decided to come back to the hospital for help with.  He has not been taking his psychiatric medications.     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 echo 5/17: EF 60-65%, no RWMA, LV dias fxn nl, LA mildly dilated, RV sys fxn nl, PASP nl, moderate sized circumferential pericardial effusion was identified, 2.12 cm around the LV free wall, <1 cm around the RV free wall. Features were not c/w tamponade physiology   PTSD (post-traumatic stress disorder)    Witnessed brother's suicide.   Restless leg syndrome    Syncope     Past Surgical History:  Procedure Laterality Date   CYSTOSCOPY WITH URETEROSCOPY AND STENT PLACEMENT     ESOPHAGOGASTRODUODENOSCOPY N/A 01/11/2016   Procedure: ESOPHAGOGASTRODUODENOSCOPY (EGD);  Surgeon: Wilford Corner, MD;  Location: [facility] ENDOSCOPY;  Service: Endoscopy;  Laterality: N/A;   ESOPHAGOGASTRODUODENOSCOPY N/A 04/09/2020   Procedure:  ESOPHAGOGASTRODUODENOSCOPY (EGD);  Surgeon: Jonathon Bellows, MD;  Location: [facility] ENDOSCOPY;  Service: Gastroenterology;  Laterality: N/A;   INCISION AND DRAINAGE ABSCESS Left 01/02/2018   Procedure: INCISION AND DRAINAGE LEFT KNEE;  Surgeon: Earnestine Leys, MD;  Location: [facility] ORS;  Service: Orthopedics;  Laterality: Left;   JOINT REPLACEMENT Right    TKR   KNEE ARTHROSCOPY Right 06/25/2014   Procedure: ARTHROSCOPY KNEE;  Surgeon: Earnestine Leys, MD;  Location: [facility] ORS;  Service: Orthopedics;  Laterality: Right;  partial arthroscopic medial menisectomy   TOTAL KNEE ARTHROPLASTY Right 04/22/2015   Procedure: TOTAL KNEE ARTHROPLASTY;  Surgeon: Earnestine Leys, MD;  Location: [facility] ORS;  Service: Orthopedics;  Laterality: Right;   TOTAL KNEE ARTHROPLASTY Left 10/30/2017   Procedure: TOTAL KNEE ARTHROPLASTY;  Surgeon: Earnestine Leys, MD;  Location: [facility] ORS;  Service: Orthopedics;  Laterality: Left;   TOTAL KNEE REVISION Left 01/02/2018   Procedure: poly exchange of tibia and patella left knee;  Surgeon: Earnestine Leys, MD;  Location: [facility] ORS;  Service: Orthopedics;  Laterality: Left;     Physical Exam   Triage Vital Signs: ED Triage Vitals  Enc Vitals Group     BP 01/30/21 1709 110/73     Pulse Rate 01/30/21 1709 86     Resp 01/30/21 1709 18     Temp 01/30/21 1709 97.6 F (36.4 C)  Temp Source 01/30/21 1709 Oral     SpO2 01/30/21 1709 100 %     Weight 01/30/21 1707 165 lb (74.8 kg)     Height 01/30/21 1707 6' (1.829 m)     Head Circumference --      Peak Flow --      Pain Score 01/30/21 1706 8     Pain Loc --      Pain Edu? --      Excl. in [place]? --     Most recent vital signs: Vitals:   01/30/21 1709  BP: 110/73  Pulse: 86  Resp: 18  Temp: 97.6 F (36.4 C)  SpO2: 100%    General: No apparent distress HEENT: moist mucous membranes CV: RRR Pulm: Normal WOB GI: soft and non tender MSK: no edema or cyanosis.  Bruising and excoriations of the left knee Neuro: face symmetric,  moving all extremities Psych: Calm and cooperative, normal speech and behavior, mood and affect normal   ED Results / Procedures / Treatments   Labs (all labs ordered are listed, but only abnormal results are displayed) Labs Reviewed - No data to display   EKG  none   RADIOLOGY none   PROCEDURES:  Critical Care performed: No  Procedures    IMPRESSION / MDM / [place] / ED COURSE  I reviewed the triage vital signs and the nursing notes.  61 y.o. male with a history of bipolar disorder, hypertension, PTSD who presents for evaluation of the self-harm thoughts.  Patient with no SI or HI.  Has some bruising of the left knee that were self-induced.  No laceration  Ddx: Self injures behavior versus suicidal thoughts versus depression   Plan: Consult psychiatry.  No labs needed since patient was discharged yesterday   [place] ED: Medications - No data to display   ED COURSE: Patient remained calm and cooperative throughout the night.  Was evaluated by psychiatry and cleared for discharge.  Patient continues to deny any suicidal thoughts.  And can contract for safety.  Patient stable for discharge now   Consults: Psychiatry   EMR reviewed records from his last admission 10 days ago to our psych ward       FINAL CLINICAL IMPRESSION(S) / ED DIAGNOSES   Final diagnoses:  Thoughts of self harm     Rx / DC Orders   ED Discharge Orders     None        Note:  This document was prepared using Dragon voice recognition software and may include unintentional dictation errors.   Alfred Levins, [place], MD 01/31/21 [phone]

## 2021-01-31 NOTE — Consult Note (Signed)
[place] [facility]iat[facility] Consult   Reason for Consult:  Psych evaluation Referring Physician:  Dr. Alfred Levins Patient Identification: [name] MRN:  [id number] Principal Diagnosis: <principal problem not specified> Diagnosis:  Active Problems:   Nonsuicidal self-injury ([place])   Total Time spent with patient: 45 minutes   HPI: [name], 61 y.o., male patient seen  by this provider; chart reviewed and consulted with Dr. Dwyane Dee on 01/31/21.  On evaluation [name] reports that he wants to injure himself. He was discharged from the hospital earlier in the afternoon. He was given a bus pass but decided to return to the er for self injurious thoughts.  He has taken a rock a injured his knee. On assessment, it is determined that patient is depressed due to recent homelessness. He was living with a friend but the friend had to put him out due to and increase in rent. Has has been without a place to live for a week.  He states the shelter has no availability until February first.    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 echo 5/17: EF 60-65%, no RWMA, LV dias fxn nl, LA mildly dilated, RV sys fxn nl, PASP nl, moderate sized circumferential pericardial effusion was identified, 2.12 cm around the LV free wall, <1 cm around the RV free wall. Features were not c/w tamponade physiology   PTSD (post-traumatic stress disorder)    Witnessed brother's  suicide.   Restless leg syndrome    Syncope     Past Surgical History:  Procedure Laterality Date   CYSTOSCOPY WITH URETEROSCOPY AND STENT PLACEMENT     ESOPHAGOGASTRODUODENOSCOPY N/A 01/11/2016   Procedure: ESOPHAGOGASTRODUODENOSCOPY (EGD);  Surgeon: Wilford Corner, MD;  Location: [facility] ENDOSCOPY;  Service: Endoscopy;  Laterality: N/A;   ESOPHAGOGASTRODUODENOSCOPY N/A 04/09/2020   Procedure: ESOPHAGOGASTRODUODENOSCOPY (EGD);  Surgeon: Jonathon Bellows, MD;  Location: [facility] ENDOSCOPY;  Service: Gastroenterology;  Laterality: N/A;   INCISION AND DRAINAGE ABSCESS Left 01/02/2018   Procedure: INCISION AND DRAINAGE LEFT KNEE;  Surgeon: Earnestine Leys, MD;  Location: [facility] ORS;  Service: Orthopedics;  Laterality: Left;   JOINT REPLACEMENT Right    TKR   KNEE ARTHROSCOPY Right 06/25/2014   Procedure: ARTHROSCOPY KNEE;  Surgeon: Earnestine Leys, MD;  Location: [facility] ORS;  Service: Orthopedics;  Laterality: Right;  partial arthroscopic medial menisectomy   TOTAL KNEE ARTHROPLASTY Right 04/22/2015   Procedure: TOTAL KNEE ARTHROPLASTY;  Surgeon: Earnestine Leys, MD;  Location: [facility] ORS;  Service: Orthopedics;  Laterality: Right;   TOTAL KNEE ARTHROPLASTY Left 10/30/2017   Procedure: TOTAL KNEE ARTHROPLASTY;  Surgeon: Earnestine Leys, MD;  Location: [facility] ORS;  Service: Orthopedics;  Laterality: Left;   TOTAL KNEE REVISION Left 01/02/2018   Procedure: poly exchange of tibia and patella left knee;  Surgeon: Earnestine Leys, MD;  Location: [facility] ORS;  Service: Orthopedics;  Laterality:  Left;   Family History:  Family History  Problem Relation Age of Onset   CVA Mother        deceased at age 37   Depression Brother        Died by suicide at age 71   Family Psychiatric  History: unknown Social History:  Social History   Substance and Sexual Activity  Alcohol Use Yes     Social History   Substance and Sexual Activity  Drug Use Yes   Types: Marijuana    Social History   Socioeconomic History   Marital  status: Single    Spouse name: Not on file   Number of children: Not on file   Years of education: Not on file   Highest education level: Not on file  Occupational History   Not on file  Tobacco Use   Smoking status: Former    Packs/day: 0.75    Years: 20.00    Pack years: 15.00    Types: Cigarettes    Quit date: 05/16/1984    Years since quitting: 36.7   Smokeless tobacco: Never  Vaping Use   Vaping Use: Never used  Substance and Sexual Activity   Alcohol use: Yes   Drug use: Yes    Types: Marijuana   Sexual activity: Not on file  Other Topics Concern   Not on file  Social History Narrative   Not on file   Social Determinants of Health   Financial Resource Strain: Not on file  Food Insecurity: Not on file  Transportation Needs: Not on file  Physical Activity: Not on file  Stress: Not on file  Social Connections: Not on file   Additional Social History:    Allergies:   Allergies  Allergen Reactions   Toradol [Ketorolac Tromethamine] Hives    Labs:  Results for orders placed or performed during the hospital encounter of 01/29/21 (from the past 48 hour(s))  CBC - if new onset seizures     Status: Abnormal   Collection Time: 01/29/21  3:32 PM  Result Value Ref Range   WBC 9.2 4.0 - 10.5 K/uL   RBC 4.34 4.22 - 5.81 MIL/uL   Hemoglobin 12.8 (L) 13.0 - 17.0 g/dL   HCT 37.5 (L) 39.0 - 52.0 %   MCV 86.4 80.0 - 100.0 fL   MCH 29.5 26.0 - 34.0 pg   MCHC 34.1 30.0 - 36.0 g/dL   RDW 15.9 (H) 11.5 - 15.5 %   Platelets 262 150 - 400 K/uL   nRBC 0.0 0.0 - 0.2 %    Comment: Performed at [facility], [address] [place], [place] [id number]  Basic metabolic panel - if new onset seizures     Status: Abnormal   Collection Time: 01/29/21  3:32 PM  Result Value Ref Range   Sodium 129 (L) 135 - 145 mmol/L   Potassium 4.2 3.5 - 5.1 mmol/L   Chloride 99 98 - 111 mmol/L   CO2 22 22 - 32 mmol/L   Glucose, Bld 114 (H) 70 - 99 mg/dL    Comment: Glucose reference  range applies only to samples taken after fasting for at least 8 hours.   BUN 36 (H) 6 - 20 mg/dL   Creatinine, Ser 2.05 (H) 0.61 - 1.24 mg/dL   Calcium 9.4 8.9 - 10.3 mg/dL   GFR, Estimated 37 (L) >60 mL/min    Comment: (NOTE) Calculated using the CKD-EPI Creatinine Equation (2021)    Anion gap 8 5 -  15    Comment: Performed at [facility], [address] [place], [place] [address]  CK     Status: None   Collection Time: 01/29/21  3:32 PM  Result Value Ref Range   Total CK 285 49 - 397 U/L    Comment: Performed at [facility], [address] [place], [place] [address]  Lithium level     Status: None   Collection Time: 01/29/21  3:34 PM  Result Value Ref Range   Lithium Lvl 0.96 0.60 - 1.20 mmol/L    Comment: Performed at [facility], [address] [place], [place] [address]  Lactic acid, plasma     Status: None   Collection Time: 01/29/21  5:09 PM  Result Value Ref Range   Lactic Acid, Venous 0.6 0.5 - 1.9 mmol/L    Comment: Performed at [facility], [address], [place],  [address]  Resp Panel by RT-PCR (Flu A&B, Covid) Nasopharyngeal Swab     Status: None   Collection Time: 01/29/21  5:21 PM   Specimen: Nasopharyngeal Swab; Nasopharyngeal(NP) swabs in vial transport medium  Result Value Ref Range   SARS Coronavirus 2 by RT PCR NEGATIVE NEGATIVE    Comment: (NOTE) SARS-CoV-2 target nucleic acids are NOT DETECTED.  The SARS-CoV-2 RNA is generally detectable in upper respiratory specimens during the acute phase of infection. The lowest concentration of SARS-CoV-2 viral copies this assay can detect is 138 copies/mL. A negative result does not preclude SARS-Cov-2 infection and should not be used as the sole basis for treatment or other patient management decisions. A negative result may occur with  improper specimen collection/handling, submission of specimen other than nasopharyngeal swab, presence of viral mutation(s)  within the areas targeted by this assay, and inadequate number of viral copies(<138 copies/mL). A negative result must be combined with clinical observations, patient history, and epidemiological information. The expected result is Negative.  Fact Sheet for Patients:  EntrepreneurPulse.com.au  Fact Sheet for Healthcare Providers:  IncredibleEmployment.be  This test is no t yet approved or cleared by the [place] FDA and  has been authorized for detection and/or diagnosis of SARS-CoV-2 by FDA under an Emergency Use Authorization (EUA). This EUA will remain  in effect (meaning this test can be used) for the duration of the COVID-19 declaration under Section 564(b)(1) of the Act, 21 U.S.C.section 360bbb-3(b)(1), unless the authorization is terminated  or revoked sooner.       Influenza A by PCR NEGATIVE NEGATIVE   Influenza B by PCR NEGATIVE NEGATIVE    Comment: (NOTE) The Xpert Xpress SARS-CoV-2/FLU/RSV plus assay is intended as an aid in the diagnosis of influenza from Nasopharyngeal swab specimens and should not be used as a sole basis for treatment. Nasal washings and aspirates are unacceptable for Xpert Xpress SARS-CoV-2/FLU/RSV testing.  Fact Sheet for Patients: EntrepreneurPulse.com.au  Fact Sheet for Healthcare Providers: IncredibleEmployment.be  This test is not yet approved or cleared by the [place] FDA and has been authorized for detection and/or diagnosis of SARS-CoV-2 by FDA under an Emergency Use Authorization (EUA). This EUA will remain in effect (meaning this test can be used) for the duration of the COVID-19 declaration under Section 564(b)(1) of the Act, 21 U.S.C. section 360bbb-3(b)(1), unless the authorization is terminated or revoked.  Performed at [facility], [address] [place],  [address]   Urinalysis, Routine w reflex microscopic     Status:  Abnormal   Collection Time: 01/29/21  9:08  PM  Result Value Ref Range   Color, Urine YELLOW (A) YELLOW   APPearance CLEAR (A) CLEAR   Specific Gravity, Urine 1.005 1.005 - 1.030   pH 5.0 5.0 - 8.0   Glucose, UA NEGATIVE NEGATIVE mg/dL   Hgb urine dipstick NEGATIVE NEGATIVE   Bilirubin Urine NEGATIVE NEGATIVE   Ketones, ur NEGATIVE NEGATIVE mg/dL   Protein, ur NEGATIVE NEGATIVE mg/dL   Nitrite NEGATIVE NEGATIVE   Leukocytes,Ua NEGATIVE NEGATIVE    Comment: Performed at [facility], [address], [place], [place] [address]  Urine Drug Screen, Qualitative     Status: Abnormal   Collection Time: 01/29/21  9:08 PM  Result Value Ref Range   Tricyclic, Ur Screen POSITIVE (A) NONE DETECTED   Amphetamines, Ur Screen NONE DETECTED NONE DETECTED   MDMA (Ecstasy)Ur Screen NONE DETECTED NONE DETECTED   Cocaine Metabolite,Ur [place] NONE DETECTED NONE DETECTED   Opiate, Ur Screen NONE DETECTED NONE DETECTED   Phencyclidine (PCP) Ur S NONE DETECTED NONE DETECTED   Cannabinoid 50 Ng, Ur [place] POSITIVE (A) NONE DETECTED   Barbiturates, Ur Screen NONE DETECTED NONE DETECTED   Benzodiazepine, Ur Scrn NONE DETECTED NONE DETECTED   Methadone Scn, Ur NONE DETECTED NONE DETECTED    Comment: (NOTE) Tricyclics + metabolites, urine    Cutoff 1000 ng/mL Amphetamines + metabolites, urine  Cutoff 1000 ng/mL MDMA (Ecstasy), urine              Cutoff 500 ng/mL Cocaine Metabolite, urine          Cutoff 300 ng/mL Opiate + metabolites, urine        Cutoff 300 ng/mL Phencyclidine (PCP), urine         Cutoff 25 ng/mL Cannabinoid, urine                 Cutoff 50 ng/mL Barbiturates + metabolites, urine  Cutoff 200 ng/mL Benzodiazepine, urine              Cutoff 200 ng/mL Methadone, urine                   Cutoff 300 ng/mL  The urine drug screen provides only a preliminary, unconfirmed analytical test result and should not be used for non-medical purposes. Clinical consideration and professional judgment  should be applied to any positive drug screen result due to possible interfering substances. A more specific alternate chemical method must be used in order to obtain a confirmed analytical result. Gas chromatography / mass spectrometry (GC/MS) is the preferred confirm atory method. Performed at [facility], [address] [place], [place] [id number]   Basic metabolic panel     Status: Abnormal   Collection Time: 01/29/21  9:41 PM  Result Value Ref Range   Sodium 135 135 - 145 mmol/L   Potassium 3.7 3.5 - 5.1 mmol/L   Chloride 108 98 - 111 mmol/L   CO2 21 (L) 22 - 32 mmol/L   Glucose, Bld 140 (H) 70 - 99 mg/dL    Comment: Glucose reference range applies only to samples taken after fasting for at least 8 hours.   BUN 29 (H) 6 - 20 mg/dL   Creatinine, Ser 1.53 (H) 0.61 - 1.24 mg/dL   Calcium 8.6 (L) 8.9 - 10.3 mg/dL   GFR, Estimated 52 (L) >60 mL/min    Comment: (NOTE) Calculated using the CKD-EPI Creatinine Equation (2021)    Anion gap 6 5 - 15    Comment: Performed at [facility], [place], [place] [address]    No current facility-administered medications for this encounter.   Current Outpatient Medications  Medication Sig Dispense Refill   aspirin 325 MG EC tablet Take 1 tablet (325 mg total) by mouth daily. 30 tablet 1   docusate sodium (COLACE) 100 MG capsule Take 1 capsule (100 mg total) by mouth 2 (two) times daily. 60 capsule 1   enalapril (VASOTEC) 10 MG tablet Take 1 tablet (10 mg total) by mouth daily. 10 tablet 0   FLUoxetine (PROZAC) 40 MG capsule Take 2 capsules (80 mg total) by mouth daily. 60 capsule 1   gabapentin (NEURONTIN) 400 MG capsule Take 1 capsule (400 mg total) by mouth 3 (three) times daily. 90 capsule 1   lithium carbonate (LITHOBID) 300 MG CR tablet Take 1 tablet (300 mg total) by mouth every 12 (twelve) hours. 60 tablet 1   naltrexone (DEPADE) 50 MG tablet Take 1 tablet (50 mg total) by mouth daily. 30 tablet 1    pantoprazole (PROTONIX) 40 MG tablet Take 1 tablet (40 mg total) by mouth daily. 30 tablet 1   QUEtiapine (SEROQUEL XR) 300 MG 24 hr tablet Take 2 tablets (600 mg total) by mouth at bedtime. 60 tablet 1   QUEtiapine (SEROQUEL) 50 MG tablet Take 1 tablet (50 mg total) by mouth 3 (three) times daily. 90 tablet 1   topiramate (TOPAMAX) 25 MG tablet Take 1 tablet (25 mg total) by mouth daily. 30 tablet 0    Musculoskeletal: Strength & Muscle Tone: within normal limits Gait & Station: normal Patient leans: Right   Psychiatric Specialty Exam:  Presentation  General Appearance: Appropriate for Environment  Eye Contact:Fair  Speech:No data recorded Speech Volume:Normal  Handedness:No data recorded  Mood and Affect  Mood:Anxious; Depressed; Dysphoric  Affect:Blunt   Thought Process  Thought Processes:Coherent  Descriptions of Associations:Intact  Orientation:Full (Time, Place and Person)  Thought Content:WDL  History of Schizophrenia/Schizoaffective disorder:No  Duration of Psychotic Symptoms:Less than six months  Hallucinations:No data recorded Ideas of Reference:Paranoia  Suicidal Thoughts:No data recorded Homicidal Thoughts:No data recorded  Sensorium  Memory:Immediate Good  Judgment:Fair  Insight:Fair   Executive Functions  Concentration:Fair  Attention Span:Good  [place]   Psychomotor Activity  Psychomotor Activity:No data recorded  Assets  Assets:Desire for Improvement; Resilience   Sleep  Sleep:No data recorded  Physical Exam: Physical Exam Vitals and nursing note reviewed.  HENT:     Head: Normocephalic and atraumatic.     Nose: Nose normal.  Eyes:     Pupils: Pupils are equal, round, and reactive to light.  Pulmonary:     Effort: Pulmonary effort is normal.  Musculoskeletal:        General: Normal range of motion.     Cervical back: Normal range of motion.  Skin:    General: Skin is  warm and dry.  Neurological:     Mental Status: He is alert.  Psychiatric:        Attention and Perception: Attention normal.        Mood and Affect: Mood is depressed.   ROS Blood pressure 110/73, pulse 86, temperature 97.6 F (36.4 C), temperature source Oral, resp. rate 18, height 6' (1.829 m), weight 74.8 kg, SpO2 100 %. Body mass index is 22.38 kg/m.   Disposition: No evidence of imminent risk to self or others at present.   Discussed crisis plan, support from social network, calling 911, coming to the Emergency  Department, and calling Suicide Hotline.  Deloria Lair, NP 01/31/2021 2:54 AM

## 2021-01-31 NOTE — Discharge Instructions (Signed)
You have been seen in the Emergency Department (ED)  today for a psychiatric complaint.  You have been evaluated by psychiatry and we believe you are safe to be discharged from the hospital.   ° °Please return to the Emergency Department (ED)  immediately if you have ANY thoughts of hurting yourself or anyone else, so that we may help you. ° °Please avoid alcohol and drug use. ° °Follow up with your doctor and/or therapist as soon as possible regarding today's ED  visit.  ° °You may call crisis hotline for [place] at [phone]. ° °

## 2021-04-30 ENCOUNTER — Emergency Department: Payer: Medicaid Other

## 2021-04-30 ENCOUNTER — Encounter: Payer: Self-pay | Admitting: Radiology

## 2021-04-30 ENCOUNTER — Emergency Department
Admission: EM | Admit: 2021-04-30 | Discharge: 2021-05-02 | Disposition: A | Discharge status: Other Acute Inpatient Hospital | Payer: Medicaid Other | Attending: Emergency Medicine | Admitting: Emergency Medicine

## 2021-04-30 ENCOUNTER — Other Ambulatory Visit: Payer: Self-pay

## 2021-04-30 DIAGNOSIS — S61419A Laceration without foreign body of unspecified hand, initial encounter: Secondary | ICD-10-CM

## 2021-04-30 DIAGNOSIS — X58XXXA Exposure to other specified factors, initial encounter: Secondary | ICD-10-CM | POA: Diagnosis not present

## 2021-04-30 DIAGNOSIS — T424X1A Poisoning by benzodiazepines, accidental (unintentional), initial encounter: Secondary | ICD-10-CM | POA: Insufficient documentation

## 2021-04-30 DIAGNOSIS — T424X2A Poisoning by benzodiazepines, intentional self-harm, initial encounter: Secondary | ICD-10-CM | POA: Diagnosis not present

## 2021-04-30 DIAGNOSIS — F332 Major depressive disorder, recurrent severe without psychotic features: Secondary | ICD-10-CM | POA: Diagnosis present

## 2021-04-30 DIAGNOSIS — R4588 Nonsuicidal self-harm: Secondary | ICD-10-CM | POA: Diagnosis not present

## 2021-04-30 DIAGNOSIS — S6991XA Unspecified injury of right wrist, hand and finger(s), initial encounter: Secondary | ICD-10-CM | POA: Diagnosis present

## 2021-04-30 DIAGNOSIS — Z23 Encounter for immunization: Secondary | ICD-10-CM | POA: Diagnosis not present

## 2021-04-30 DIAGNOSIS — F32A Depression, unspecified: Secondary | ICD-10-CM

## 2021-04-30 DIAGNOSIS — Z20822 Contact with and (suspected) exposure to covid-19: Secondary | ICD-10-CM | POA: Insufficient documentation

## 2021-04-30 DIAGNOSIS — T50902A Poisoning by unspecified drugs, medicaments and biological substances, intentional self-harm, initial encounter: Secondary | ICD-10-CM

## 2021-04-30 DIAGNOSIS — Y906 Blood alcohol level of 120-199 mg/100 ml: Secondary | ICD-10-CM | POA: Diagnosis not present

## 2021-04-30 DIAGNOSIS — F319 Bipolar disorder, unspecified: Secondary | ICD-10-CM | POA: Diagnosis not present

## 2021-04-30 DIAGNOSIS — F3130 Bipolar disorder, current episode depressed, mild or moderate severity, unspecified: Secondary | ICD-10-CM | POA: Insufficient documentation

## 2021-04-30 DIAGNOSIS — S61411A Laceration without foreign body of right hand, initial encounter: Secondary | ICD-10-CM | POA: Diagnosis not present

## 2021-04-30 LAB — COMPREHENSIVE METABOLIC PANEL
ALT: 22 U/L (ref 0–44)
AST: 29 U/L (ref 15–41)
Albumin: 3.8 g/dL (ref 3.5–5.0)
Alkaline Phosphatase: 57 U/L (ref 38–126)
Anion gap: 11 (ref 5–15)
BUN: 13 mg/dL (ref 6–20)
CO2: 21 mmol/L — ABNORMAL LOW (ref 22–32)
Calcium: 9.3 mg/dL (ref 8.9–10.3)
Chloride: 107 mmol/L (ref 98–111)
Creatinine, Ser: 0.97 mg/dL (ref 0.61–1.24)
GFR, Estimated: >60 mL/min (ref >60)
Glucose, Bld: 91 mg/dL (ref 70–99)
Potassium: 4.2 mmol/L (ref 3.5–5.1)
Sodium: 139 mmol/L (ref 135–145)
Total Bilirubin: 0.5 mg/dL (ref 0.3–1.2)
Total Protein: 7 g/dL (ref 6.5–8.1)

## 2021-04-30 LAB — SALICYLATE LEVEL: Salicylate Lvl: 17.8 mg/dL (ref 7.0–30.0)

## 2021-04-30 LAB — CBC
HCT: 39.9 % (ref 39.0–52.0)
Hemoglobin: 13.2 g/dL (ref 13.0–17.0)
MCH: 29.3 pg (ref 26.0–34.0)
MCHC: 33.1 g/dL (ref 30.0–36.0)
MCV: 88.5 fL (ref 80.0–100.0)
Platelets: 239 10*3/uL (ref 150–400)
RBC: 4.51 MIL/uL (ref 4.22–5.81)
RDW: 14.1 % (ref 11.5–15.5)
WBC: 6.5 10*3/uL (ref 4.0–10.5)
nRBC: 0 % (ref 0.0–0.2)

## 2021-04-30 LAB — ACETAMINOPHEN LEVEL: Acetaminophen (Tylenol), Serum: <10 ug/mL — ABNORMAL LOW (ref 10–30)

## 2021-04-30 LAB — LITHIUM LEVEL: Lithium Lvl: 0.31 mmol/L — ABNORMAL LOW (ref 0.60–1.20)

## 2021-04-30 LAB — ETHANOL: Alcohol, Ethyl (B): 156 mg/dL — ABNORMAL HIGH (ref <10)

## 2021-04-30 MED ORDER — QUETIAPINE FUMARATE ER 200 MG PO TB24
400.0000 mg | ORAL_TABLET | Freq: Every day | ORAL | Status: DC
  Administered 2021-05-01: 400 mg via ORAL
  Filled 2021-04-30 (×2): qty 2

## 2021-04-30 MED ORDER — LIDOCAINE HCL (PF) 1 % IJ SOLN
5.0000 mL | Freq: Once | INTRAMUSCULAR | Status: DC
  Filled 2021-04-30: qty 5

## 2021-04-30 MED ORDER — FLUOXETINE HCL 20 MG PO CAPS
80.0000 mg | ORAL_CAPSULE | Freq: Every day | ORAL | Status: DC
  Administered 2021-05-01 – 2021-05-02 (×2): 80 mg via ORAL
  Filled 2021-04-30 (×2): qty 4

## 2021-04-30 MED ORDER — QUETIAPINE FUMARATE 25 MG PO TABS
25.0000 mg | ORAL_TABLET | Freq: Two times a day (BID) | ORAL | Status: DC | PRN
Start: 2021-04-30 — End: 2021-05-02
  Administered 2021-05-01: 25 mg via ORAL
  Filled 2021-04-30: qty 1

## 2021-04-30 MED ORDER — GABAPENTIN 300 MG PO CAPS
400.0000 mg | ORAL_CAPSULE | Freq: Three times a day (TID) | ORAL | Status: DC
  Administered 2021-05-01 – 2021-05-02 (×5): 400 mg via ORAL
  Filled 2021-04-30 (×5): qty 1

## 2021-04-30 MED ORDER — PANTOPRAZOLE SODIUM 40 MG PO TBEC
40.0000 mg | DELAYED_RELEASE_TABLET | Freq: Every day | ORAL | Status: DC
  Administered 2021-05-01 – 2021-05-02 (×2): 40 mg via ORAL
  Filled 2021-04-30 (×2): qty 1

## 2021-04-30 MED ORDER — ENALAPRIL MALEATE 10 MG PO TABS
10.0000 mg | ORAL_TABLET | Freq: Every day | ORAL | Status: DC
  Administered 2021-05-01 – 2021-05-02 (×2): 10 mg via ORAL
  Filled 2021-04-30 (×2): qty 1

## 2021-04-30 MED ORDER — LITHIUM CARBONATE ER 300 MG PO TBCR
300.0000 mg | EXTENDED_RELEASE_TABLET | Freq: Two times a day (BID) | ORAL | Status: DC
  Administered 2021-05-01 – 2021-05-02 (×3): 300 mg via ORAL
  Filled 2021-04-30 (×4): qty 1
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
## 2021-04-30 NOTE — ED Provider Notes (Signed)
? ?[facility] ?Provider Note ? ? ? Event Date/Time  ? First MD Initiated Contact with Patient 04/30/21 2046   ?  (approximate) ? ? ?History  ? ?Drug Overdose ? ? ?HPI ?Juwon Colon Morss is a 61 y.o. male  who, per discharge summary dated 01/28/2021 has history of severe recurrent major depression, who presents to the emergency department today because of concern for overdose. The patient took potentially 26 0.5mg  klonopins. He also suffered lacerations to his right hand although cannot remember what happened. The patient states he feels like he has a lot going on mentally.  ? ?Physical Exam  ? ?Triage Vital Signs: ?ED Triage Vitals  ?Enc Vitals Group  ?   BP 04/30/21 2028 125/85  ?   Pulse Rate 04/30/21 2028 78  ?   Resp 04/30/21 2028 16  ?   Temp 04/30/21 2028 98.3 ?F (36.8 ?C)  ?   Temp Source 04/30/21 2028 Oral  ?   SpO2 04/30/21 2028 95 %  ?   Weight 04/30/21 2029 145 lb (65.8 kg)  ?   Height 04/30/21 2029 5\' 6"  (1.676 m)  ?   Head Circumference --   ?   Peak Flow --   ?   Pain Score 04/30/21 2029 5  ? ?Most recent vital signs: ?Vitals:  ? 04/30/21 2028  ?BP: 125/85  ?Pulse: 78  ?Resp: 16  ?Temp: 98.3 ?F (36.8 ?C)  ?SpO2: 95%  ? ? ?General: Awake, alert and oriented. ?CV:  Good peripheral perfusion. Regular rate and rhythm. ?Resp:  Normal effort. Lungs clear to auscultation ?Abd:  No distention.  ?Skin:  Lacerations to right knuckles. ? ? ?ED Results / Procedures / Treatments  ? ?Labs ?(all labs ordered are listed, but only abnormal results are displayed) ?Labs Reviewed  ?COMPREHENSIVE METABOLIC PANEL - Abnormal; Notable for the following components:  ?    Result Value  ? CO2 21 (*)   ? All other components within normal limits  ?ETHANOL - Abnormal; Notable for the following components:  ? Alcohol, Ethyl (B) 156 (*)   ? All other components within normal limits  ?ACETAMINOPHEN LEVEL - Abnormal; Notable for the following components:  ? Acetaminophen (Tylenol), Serum <10 (*)   ? All other  components within normal limits  ?LITHIUM LEVEL - Abnormal; Notable for the following components:  ? Lithium Lvl 0.31 (*)   ? All other components within normal limits  ?ACETAMINOPHEN LEVEL - Abnormal; Notable for the following components:  ? Acetaminophen (Tylenol), Serum <10 (*)   ? All other components within normal limits  ?RESP PANEL BY RT-PCR (FLU A&B, COVID) [id number]  ?SALICYLATE LEVEL  ?CBC  ?SALICYLATE LEVEL  ?URINE DRUG SCREEN, QUALITATIVE ([facility] ONLY)  ? ? ? ?EKG ? ?[id number], attending physician, personally viewed and interpreted this EKG ? ?EKG Time: 2016 ?Rate: 75 ?Rhythm: normal sinus rhythm ?Axis: normal ?Intervals: qtc 428 ?QRS: narrow ?ST changes: no st elevation ?Impression: normal ekg ? ?RADIOLOGY ?I independently interpreted and visualized the right hand. My interpretation: no osseous abnormality, no FB ?Radiology interpretation:  ?IMPRESSION:  ?No acute bony abnormality.  ? ? ? ?PROCEDURES: ? ?Critical Care performed: No ? ?Procedures ? ?LACERATION REPAIR ?Performed by: 2017 ?Authorized by: Phineas Semen ?Consent: Verbal consent obtained. ?Risks and benefits: risks, benefits and alternatives were discussed ?Consent given by: patient ?Patient identity confirmed: provided demographic data ?Prepped and Draped in normal sterile fashion ?Wound explored ? ?Laceration Location: right knuckles ? ?Laceration Length:  total length 3 cm ? ?No Foreign Bodies seen or palpated ? ?Anesthesia: local infiltration ? ?Local anesthetic: lidocaine 1% without epinephrine ? ?Anesthetic total: 2 ml ? ?Irrigation method: syringe ?Amount of cleaning: standard ? ?Skin closure: 4-0 vicryl rapide ? ?Number of sutures: 13 ? ?Technique: simple interrupted. ? ?Patient tolerance: Patient tolerated the procedure well with no immediate complications. ? ? ?MEDICATIONS ORDERED IN ED: ?Medications - No data to display ? ? ?IMPRESSION / MDM / ASSESSMENT AND PLAN / ED COURSE  ?I reviewed the triage vital signs and  the nursing notes. ?             ?               ? ?Differential diagnosis includes, but is not limited to, SI, overdose, laceration. ?Patient presented to the emergency department today because of concerns for intentional overdose.  Patient does have a history of depression.  Additionally patient suffered lacerations to his right knuckles.  Given concerns for intentional overdose Poison control was contacted.  Additionally I did place patient under IVC given concerns for suicidal ideation will have psychiatry evaluate.  Patient did have 3 lacerations a total of 3 cm in length over his right knuckles that were closed with sutures. ? ?FINAL CLINICAL IMPRESSION(S) / ED DIAGNOSES  ? ?Final diagnoses:  ?Intentional overdose, initial encounter [facility])  ?Depression, unspecified depression type  ?Laceration of dorsum of hand  ? ? ? ? ? ? ?Note:  This document was prepared using Dragon voice recognition software and may include unintentional dictation errors. ? ?  ?Phineas Semen, MD ?05/01/21 1548 ? ?
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
## 2021-04-30 NOTE — ED Notes (Signed)
TTS in with patient.  

## 2021-04-30 NOTE — ED Notes (Signed)
Pt cleaning the blood out of the floor and off of his bed. Bed linen changed at this time.  ?

## 2021-04-30 NOTE — ED Notes (Signed)
Dr. Derrill Kay in to repair lacerations to right hand ?
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
## 2021-04-30 NOTE — ED Notes (Signed)
MD notified that patient pulled cardiac leads off multiple times and that the last time he pulled one of the leads from the connector breaking it.  Patient with be visually monitored and have vital signs taken hourly until medically cleared, but unable to safely continuously cardiac monitor due to patient breaking equipment. ?

## 2021-04-30 NOTE — ED Notes (Signed)
Psych provider at bedside at this time. ?

## 2021-04-30 NOTE — ED Notes (Addendum)
Pt pulled 5 lead cardiac monitoring cords off his chest and this tech replaced them once.  Pt pulled them off a second time and ripped V1 lead apart from its connection which broke the equipment.  Pt also pulled his pulse ox cord off his finger twice. Monitor has been secured behind garage door in quad room ?

## 2021-04-30 NOTE — ED Notes (Signed)
Spoke with Langley Gauss with [employer], recommends -- EKG, basic lab with tylenol and salicylate levels, cardiac monitoring for 6 hours and repeat tylenol level 4 hrs post ingestion.  If everything remains within normal limits no further monitoring after the 6 hours.  ?

## 2021-05-01 DIAGNOSIS — R4588 Nonsuicidal self-harm: Secondary | ICD-10-CM | POA: Diagnosis not present

## 2021-05-01 DIAGNOSIS — T424X2A Poisoning by benzodiazepines, intentional self-harm, initial encounter: Secondary | ICD-10-CM

## 2021-05-01 DIAGNOSIS — F319 Bipolar disorder, unspecified: Secondary | ICD-10-CM

## 2021-05-01 DIAGNOSIS — T424X1A Poisoning by benzodiazepines, accidental (unintentional), initial encounter: Secondary | ICD-10-CM | POA: Diagnosis present

## 2021-05-01 LAB — RESP PANEL BY RT-PCR (FLU A&B, COVID) ARPGX2
Influenza A by PCR: NEGATIVE
Influenza B by PCR: NEGATIVE
SARS Coronavirus 2 by RT PCR: NEGATIVE

## 2021-05-01 LAB — SALICYLATE LEVEL: Salicylate Lvl: 13.7 mg/dL (ref 7.0–30.0)

## 2021-05-01 LAB — ACETAMINOPHEN LEVEL: Acetaminophen (Tylenol), Serum: <10 ug/mL — ABNORMAL LOW (ref 10–30)

## 2021-05-01 MED ORDER — LORAZEPAM 1 MG PO TABS: 1.0000 mg | ORAL_TABLET | Freq: Four times a day (QID) | ORAL | Status: DC | PRN

## 2021-05-01 MED ORDER — LOPERAMIDE HCL 2 MG PO CAPS: 2.0000 mg | ORAL_CAPSULE | ORAL | Status: DC | PRN

## 2021-05-01 MED ORDER — ADULT MULTIVITAMIN W/MINERALS CH
1.0000 | ORAL_TABLET | Freq: Every day | ORAL | Status: DC
  Administered 2021-05-01 – 2021-05-02 (×2): 1 via ORAL
  Filled 2021-05-01 (×2): qty 1

## 2021-05-01 MED ORDER — LORAZEPAM 1 MG PO TABS: 1.0000 mg | ORAL_TABLET | Freq: Three times a day (TID) | ORAL | Status: DC

## 2021-05-01 MED ORDER — LORAZEPAM 1 MG PO TABS
1.0000 mg | ORAL_TABLET | Freq: Every day | ORAL | Status: DC
Start: 2021-05-05 — End: 2021-05-02

## 2021-05-01 MED ORDER — THIAMINE HCL 100 MG PO TABS
100.0000 mg | ORAL_TABLET | Freq: Every day | ORAL | Status: DC
Start: 2021-05-02 — End: 2021-05-02
  Administered 2021-05-02: 100 mg via ORAL
  Filled 2021-05-01: qty 1

## 2021-05-01 MED ORDER — TETANUS-DIPHTH-ACELL PERTUSSIS 5-2.5-18.5 LF-MCG/0.5 IM SUSY
0.5000 mL | PREFILLED_SYRINGE | Freq: Once | INTRAMUSCULAR | Status: AC
  Administered 2021-05-01: 0.5 mL via INTRAMUSCULAR
  Filled 2021-05-01 (×2): qty 0.5

## 2021-05-01 MED ORDER — ONDANSETRON 4 MG PO TBDP: 4.0000 mg | ORAL_TABLET | Freq: Four times a day (QID) | ORAL | Status: DC | PRN

## 2021-05-01 MED ORDER — THIAMINE HCL 100 MG/ML IJ SOLN
100.0000 mg | Freq: Once | INTRAMUSCULAR | Status: DC
  Filled 2021-05-01: qty 1

## 2021-05-01 MED ORDER — HYDROXYZINE HCL 25 MG PO TABS: 25.0000 mg | ORAL_TABLET | Freq: Four times a day (QID) | ORAL | Status: DC | PRN

## 2021-05-01 MED ORDER — LORAZEPAM 1 MG PO TABS: 1.0000 mg | ORAL_TABLET | Freq: Two times a day (BID) | ORAL | Status: DC

## 2021-05-01 MED ORDER — LORAZEPAM 1 MG PO TABS
1.0000 mg | ORAL_TABLET | Freq: Four times a day (QID) | ORAL | Status: DC
  Administered 2021-05-01 – 2021-05-02 (×3): 1 mg via ORAL
  Filled 2021-05-01 (×3): qty 1

## 2021-05-01 NOTE — ED Notes (Signed)
Case closed out by [employer] per [place]. ?

## 2021-05-01 NOTE — ED Provider Notes (Signed)
Emergency Medicine Observation Re-evaluation Note ? ?Dakoata Colon Tangonan is a 61 y.o. male, seen on rounds today.  Pt initially presented to the ED for complaints of Drug Overdose ?Currently, the patient is sleeping. ? ?Physical Exam  ?BP 123/84 (BP Location: Left Arm)   Pulse 65   Temp 98.2 ?F (36.8 ?C)   Resp 20   Ht 5\' 6"  (1.676 m)   Wt 65.8 kg   SpO2 97%   BMI 23.40 kg/m?  ?Physical Exam ?Gen: No acute distress  ?Resp: Normal rise and fall of chest ?Neuro: Moving all four extremities ?Psych: Resting currently, calm and cooperative when awake ? ? ? ?ED Course / MDM  ?EKG:  ? ?I have reviewed the labs performed to date as well as medications administered while in observation.  Recent changes in the last 24 hours include no acute events overnight. ? ?Plan  ?Current plan is for psychiatric disposition. ?Jeneen Rinks Colon C[place]hell is under involuntary commitment. ?  ? ?  ?Brant Peets, Delice Bison, DO ?05/01/21 0441 ? ?
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
## 2021-05-01 NOTE — ED Notes (Signed)
Hospital meal provided, pt tolerated w/o complaints.  Waste discarded appropriately.  

## 2021-05-01 NOTE — ED Notes (Signed)
Vital signs are not overdue. Complete set of VS was taken this morning at 0804 and documented but overdue VS alert still showing up.  ?

## 2021-05-01 NOTE — ED Notes (Signed)
Pt alert and calm at this time. Pt states he does not know what happened yesterday. Hand is not bleeding at this time but does appear slightly swollen. Pt states that his R shoulder is sore. Pt took AM meds with no problem. ?

## 2021-05-01 NOTE — BH Assessment (Signed)
Per Dr. Jerre Simon, [place] Physician, observed the patient for 24 hours in the ER before coming to the unit. ?

## 2021-05-01 NOTE — ED Notes (Signed)
IVC/Pending Placement 

## 2021-05-01 NOTE — ED Notes (Signed)
IVC pending placement 

## 2021-05-01 NOTE — ED Notes (Signed)
Report to include Situation, Background, Assessment, and Recommendations received from Katie RN. Patient alert and oriented, warm and dry, in no acute distress. Patient denies SI, HI, AVH and pain. Patient made aware of Q15 minute rounds and security cameras for their safety. Patient instructed to come to me with needs or concerns.  

## 2021-05-01 NOTE — BH Assessment (Signed)
Patient to be admitted to [facility] when bed becomes available. ?

## 2021-05-01 NOTE — ED Notes (Signed)
Unable to obtain vitals due to pt sleeping. 

## 2021-05-01 NOTE — BH Assessment (Addendum)
Referral information for Psychiatric Hospitalization faxed to;  ? ?[facility] [phone]- [phone]) ? ?[facility] [phone]),  ? ?[facility] [phone]), Per Marylene Land, there are no beds available. ? ?[facility] [phone]),  ? ?[facility] [phone] -[phone]),  ? ?[facility] [phone]). ? ?[facility] [phone]) ? ?

## 2021-05-02 ENCOUNTER — Inpatient Hospital Stay
Admission: AD | Admit: 2021-05-02 | Discharge: 2021-05-12 | DRG: 885 | Disposition: A | Discharge status: Home / Self Care | Payer: Medicaid Other | Source: Intra-hospital | Attending: Psychiatry | Admitting: Psychiatry

## 2021-05-02 ENCOUNTER — Encounter: Payer: Self-pay | Admitting: Psychiatry

## 2021-05-02 ENCOUNTER — Other Ambulatory Visit: Payer: Self-pay

## 2021-05-02 DIAGNOSIS — I10 Essential (primary) hypertension: Secondary | ICD-10-CM | POA: Diagnosis present

## 2021-05-02 DIAGNOSIS — G4700 Insomnia, unspecified: Secondary | ICD-10-CM | POA: Diagnosis present

## 2021-05-02 DIAGNOSIS — Z79899 Other long term (current) drug therapy: Secondary | ICD-10-CM

## 2021-05-02 DIAGNOSIS — Z96653 Presence of artificial knee joint, bilateral: Secondary | ICD-10-CM | POA: Diagnosis present

## 2021-05-02 DIAGNOSIS — Z5900 Homelessness unspecified: Secondary | ICD-10-CM | POA: Diagnosis not present

## 2021-05-02 DIAGNOSIS — Z20822 Contact with and (suspected) exposure to covid-19: Secondary | ICD-10-CM | POA: Diagnosis present

## 2021-05-02 DIAGNOSIS — F609 Personality disorder, unspecified: Secondary | ICD-10-CM

## 2021-05-02 DIAGNOSIS — T424X2A Poisoning by benzodiazepines, intentional self-harm, initial encounter: Secondary | ICD-10-CM | POA: Diagnosis present

## 2021-05-02 DIAGNOSIS — F6089 Other specific personality disorders: Secondary | ICD-10-CM | POA: Diagnosis present

## 2021-05-02 DIAGNOSIS — K219 Gastro-esophageal reflux disease without esophagitis: Secondary | ICD-10-CM | POA: Diagnosis present

## 2021-05-02 DIAGNOSIS — Z87891 Personal history of nicotine dependence: Secondary | ICD-10-CM | POA: Diagnosis not present

## 2021-05-02 DIAGNOSIS — F332 Major depressive disorder, recurrent severe without psychotic features: Secondary | ICD-10-CM | POA: Diagnosis present

## 2021-05-02 DIAGNOSIS — Z91148 Patient's other noncompliance with medication regimen for other reason: Secondary | ICD-10-CM

## 2021-05-02 DIAGNOSIS — Z818 Family history of other mental and behavioral disorders: Secondary | ICD-10-CM | POA: Diagnosis not present

## 2021-05-02 DIAGNOSIS — F29 Unspecified psychosis not due to a substance or known physiological condition: Secondary | ICD-10-CM | POA: Diagnosis present

## 2021-05-02 LAB — RESP PANEL BY RT-PCR (FLU A&B, COVID) ARPGX2
Influenza A by PCR: NEGATIVE
Influenza B by PCR: NEGATIVE
SARS Coronavirus 2 by RT PCR: NEGATIVE

## 2021-05-02 MED ORDER — ADULT MULTIVITAMIN W/MINERALS CH
1.0000 | ORAL_TABLET | Freq: Every day | ORAL | Status: DC
  Administered 2021-05-03 – 2021-05-12 (×10): 1 via ORAL
  Filled 2021-05-02 (×10): qty 1

## 2021-05-02 MED ORDER — LOPERAMIDE HCL 2 MG PO CAPS
2.0000 mg | ORAL_CAPSULE | ORAL | Status: AC | PRN
  Filled 2021-05-02: qty 2

## 2021-05-02 MED ORDER — MAGNESIUM HYDROXIDE 400 MG/5ML PO SUSP: 30.0000 mL | Freq: Every day | ORAL | Status: DC | PRN

## 2021-05-02 MED ORDER — ALUM & MAG HYDROXIDE-SIMETH 200-200-20 MG/5ML PO SUSP: 30.0000 mL | ORAL | Status: DC | PRN

## 2021-05-02 MED ORDER — ONDANSETRON 4 MG PO TBDP: 4.0000 mg | ORAL_TABLET | Freq: Four times a day (QID) | ORAL | Status: AC | PRN

## 2021-05-02 MED ORDER — QUETIAPINE FUMARATE ER 50 MG PO TB24
400.0000 mg | ORAL_TABLET | Freq: Every day | ORAL | Status: DC
  Administered 2021-05-02 – 2021-05-04 (×3): 400 mg via ORAL
  Filled 2021-05-02 (×3): qty 8

## 2021-05-02 MED ORDER — HYDROXYZINE HCL 25 MG PO TABS
25.0000 mg | ORAL_TABLET | Freq: Four times a day (QID) | ORAL | Status: DC | PRN
  Administered 2021-05-03 (×2): 25 mg via ORAL
  Filled 2021-05-02 (×2): qty 1

## 2021-05-02 MED ORDER — LORAZEPAM 1 MG PO TABS: 1.0000 mg | ORAL_TABLET | Freq: Four times a day (QID) | ORAL | Status: DC | PRN

## 2021-05-02 MED ORDER — LORAZEPAM 1 MG PO TABS
1.0000 mg | ORAL_TABLET | Freq: Four times a day (QID) | ORAL | Status: AC
  Administered 2021-05-02 – 2021-05-03 (×5): 1 mg via ORAL
  Filled 2021-05-02 (×5): qty 1

## 2021-05-02 MED ORDER — LITHIUM CARBONATE ER 300 MG PO TBCR
300.0000 mg | EXTENDED_RELEASE_TABLET | Freq: Two times a day (BID) | ORAL | Status: DC
  Administered 2021-05-02 – 2021-05-05 (×6): 300 mg via ORAL
  Filled 2021-05-02 (×6): qty 1

## 2021-05-02 MED ORDER — THIAMINE HCL 100 MG PO TABS
100.0000 mg | ORAL_TABLET | Freq: Every day | ORAL | Status: DC
  Administered 2021-05-03 – 2021-05-12 (×10): 100 mg via ORAL
  Filled 2021-05-02 (×10): qty 1

## 2021-05-02 MED ORDER — LORAZEPAM 1 MG PO TABS: 1.0000 mg | ORAL_TABLET | Freq: Every day | ORAL | Status: DC

## 2021-05-02 MED ORDER — GABAPENTIN 300 MG PO CAPS
400.0000 mg | ORAL_CAPSULE | Freq: Three times a day (TID) | ORAL | Status: DC
  Administered 2021-05-02 – 2021-05-12 (×30): 400 mg via ORAL
  Filled 2021-05-02 (×30): qty 1

## 2021-05-02 MED ORDER — PANTOPRAZOLE SODIUM 40 MG PO TBEC
40.0000 mg | DELAYED_RELEASE_TABLET | Freq: Every day | ORAL | Status: DC
  Administered 2021-05-03 – 2021-05-12 (×10): 40 mg via ORAL
  Filled 2021-05-02 (×10): qty 1

## 2021-05-02 MED ORDER — ACETAMINOPHEN 325 MG PO TABS
650.0000 mg | ORAL_TABLET | Freq: Four times a day (QID) | ORAL | Status: DC | PRN
  Administered 2021-05-02 – 2021-05-12 (×18): 650 mg via ORAL
  Filled 2021-05-02 (×20): qty 2

## 2021-05-02 MED ORDER — LORAZEPAM 1 MG PO TABS
1.0000 mg | ORAL_TABLET | Freq: Three times a day (TID) | ORAL | Status: AC
  Administered 2021-05-03 – 2021-05-04 (×3): 1 mg via ORAL
  Filled 2021-05-02 (×3): qty 1

## 2021-05-02 MED ORDER — ENALAPRIL MALEATE 10 MG PO TABS
10.0000 mg | ORAL_TABLET | Freq: Every day | ORAL | Status: DC
  Administered 2021-05-03 – 2021-05-12 (×9): 10 mg via ORAL
  Filled 2021-05-02 (×10): qty 1

## 2021-05-02 MED ORDER — FLUOXETINE HCL 20 MG PO CAPS
80.0000 mg | ORAL_CAPSULE | Freq: Every day | ORAL | Status: DC
  Administered 2021-05-03 – 2021-05-07 (×5): 80 mg via ORAL
  Filled 2021-05-02 (×5): qty 4

## 2021-05-02 MED ORDER — QUETIAPINE FUMARATE 25 MG PO TABS: 25.0000 mg | ORAL_TABLET | Freq: Two times a day (BID) | ORAL | Status: DC | PRN

## 2021-05-02 MED ORDER — LORAZEPAM 1 MG PO TABS
1.0000 mg | ORAL_TABLET | Freq: Two times a day (BID) | ORAL | Status: AC
Start: 2021-05-04 — End: 2021-05-05
  Administered 2021-05-04 – 2021-05-05 (×2): 1 mg via ORAL
  Filled 2021-05-02 (×2): qty 1
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
## 2021-05-02 NOTE — ED Notes (Signed)
Patient given breakfast tray and drink.  ?

## 2021-05-02 NOTE — ED Notes (Signed)
Report received from Hewan, RN including SBAR. On initial round after report Pt is warm/dry, resting quietly in room without any s/s of distress.  Will continue to monitor throughout shift as ordered for any changes in behaviors and for continued safety.   ?
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
## 2021-05-02 NOTE — Tx Team (Signed)
Initial Treatment Plan ?05/02/2021 ?3:56 PM ?[name] ?ZMO:[id number] ? ? ? ?PATIENT STRESSORS: ?Financial difficulties   ?Legal issue   ?Loss of significant relationship of 30 years (girlfriend)   ?Medication change or noncompliance   ?Substance abuse   ? ? ?PATIENT STRENGTHS: ?Capable of independent living  ?Communication skills  ?Financial means  ? ? ?PATIENT IDENTIFIED PROBLEMS: ?Alterations in mood (depression & anxiety) "I've been depressed since my 86 y/o relationship with my girlfriend ended with my legal troubles".  ?  ?Risk for self harm "I want to cut myself and bleed to death".  ?  ?Homeless "I'm couch surfing with some friend right now".  ?  ?Financial constraints "I have limited income, it's not enough"  ?  ?  ?  ? ?DISCHARGE CRITERIA:  ?Improved stabilization in mood, thinking, and/or behavior ?Verbal commitment to aftercare and medication compliance ? ?PRELIMINARY DISCHARGE PLAN: ?Outpatient therapy ?Placement in alternative living arrangements ? ?PATIENT/FAMILY INVOLVEMENT: ?This treatment plan has been presented to and reviewed with the patient, [name]. The patient have been given the opportunity to ask questions and make suggestions. ? ?Sherryl Manges, RN ?05/02/2021, 3:56 PM ?

## 2021-05-02 NOTE — Group Note (Signed)
LCSW Group Therapy Note ? ?Group Date: 05/02/2021 ?Start Time: 1300 ?End Time: 1400 ? ? ?Type of Therapy and Topic:  Group Therapy - How To Cope with Nervousness about Discharge  ? ?Participation Level:  Did Not Attend  ? ?Description of Group ?This process group involved identification of patients' feelings about discharge. Some of them are scheduled to be discharged soon, while others are new admissions, but each of them was asked to share thoughts and feelings surrounding discharge from the hospital. One common theme was that they are excited at the prospect of going home, while another was that many of them are apprehensive about sharing why they were hospitalized. Patients were given the opportunity to discuss these feelings with their peers in preparation for discharge. ? ?Therapeutic Goals ? ?Patient will identify their overall feelings about pending discharge. ?Patient will think about how they might proactively address issues that they believe will once again arise once they get home (i.e. with parents). ?Patients will participate in discussion about having hope for change. ? ? ?Summary of Patient Progress:   ?Patient did not attend group despite encouraged participation.  ? ? ?Therapeutic Modalities ?Cognitive Behavioral Therapy ? ? ?Kauri Garson W Georgette Helmer, LCSWA ?05/02/2021  2:38 PM   ?

## 2021-05-02 NOTE — ED Notes (Signed)
Pt med bag given to [name] nurse and added to belongings. Cell phone turned off also in med bag.  ?

## 2021-05-02 NOTE — ED Provider Notes (Signed)
Emergency Medicine Observation Re-evaluation Note ? ?[name] is a 62 y.o. male, seen on rounds today.  ? ?Physical Exam  ?BP 115/82 (BP Location: Right Arm)   Pulse 76   Temp 97.9 ?F (36.6 ?C)   Resp 16   Ht 5\' 6"  (1.676 m)   Wt 65.8 kg   SpO2 94%   BMI 23.40 kg/m?  ?Physical Exam ?General: Patient resting comfortably in bed ?Lungs: Patient in no respiratory distress ?Psych: Patient not combative ? ?ED Course / MDM  ?EKG:  ? ? ?Plan  ?Current plan is for psychiatric treatment and evaluation. ? [facility] [name] is under involuntary commitment. ?  ? ?  ?[name], MD ?05/02/21 [phone] ? ?

## 2021-05-02 NOTE — ED Notes (Signed)
Pt is A/O x3 at time of transfer to [facility] unit at [facility] pt transported via wheelchair with [facility] staff x1 & [facility] security x1.  All patient belongings including med bag and cell phone accounted for by pt and given to [facility] security to transport to geropsych.  ?

## 2021-05-03 DIAGNOSIS — F332 Major depressive disorder, recurrent severe without psychotic features: Secondary | ICD-10-CM

## 2021-05-03 DIAGNOSIS — F609 Personality disorder, unspecified: Secondary | ICD-10-CM

## 2021-05-03 DIAGNOSIS — F6089 Other specific personality disorders: Secondary | ICD-10-CM

## 2021-05-03 MED ORDER — ENSURE ENLIVE PO LIQD
237.0000 mL | Freq: Two times a day (BID) | ORAL | Status: DC
  Administered 2021-05-03 – 2021-05-12 (×19): 237 mL via ORAL

## 2021-05-03 MED ORDER — RISPERIDONE 1 MG PO TABS
0.5000 mg | ORAL_TABLET | ORAL | Status: DC
  Administered 2021-05-03 – 2021-05-05 (×5): 0.5 mg via ORAL
  Filled 2021-05-03 (×5): qty 1

## 2021-05-03 NOTE — Progress Notes (Signed)
Recreation Therapy Notes ? ?Date: 05/03/2021 ? ?Time: 1:30 PM   ? ?Location: Craft room  ? ?Behavioral response: Appropriate ? ?Intervention Topic: Social Skills    ? ?Discussion/Intervention:  ?Group content on today was focused on social skills. The group defined social skills and identified ways they use social skills. Patients expressed what obstacles they face when trying to be social. Participants described the importance of social skills. The group listed ways to improve social skills and reasons to improve social skills. Individuals had an opportunity to learn new and improve social skills as well as identify their weaknesses. ?Clinical Observations/Feedback: ?Patient came to group and stated that he uses his social skills by opening a door and starting a conversation. He stated that social skills are important to interact with others. Individual was social with peers and staff while participating in the intervention.    ?Zigmond Trela LRT/CTRS  ? ? ? ? ? ? ? ?Kodee Drury ?05/03/2021 3:24 PM ?
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
## 2021-05-03 NOTE — BH IP Treatment Plan (Signed)
Interdisciplinary Treatment and Diagnostic Plan Update ? ?05/03/2021 ?Time of Session: 10:00AM ?[name] ?MRN: [id number] ? ?Principal Diagnosis: Major depressive disorder, recurrent severe without psychotic features ([place]) ? ?Secondary Diagnoses: Principal Problem: ?  Major depressive disorder, recurrent severe without psychotic features ([place]) ?Active Problems: ?  Cluster B personality disorder ([place]) ? ? ?Current Medications:  ?Current Facility-Administered Medications  ?Medication Dose Route Frequency Provider Last Rate Last Admin  ? acetaminophen (TYLENOL) tablet 650 mg  650 mg Oral Q6H PRN Patrecia Pour, NP   650 mg at 05/03/21 1208  ? alum & mag hydroxide-simeth (MAALOX/MYLANTA) 200-200-20 MG/5ML suspension 30 mL  30 mL Oral Q4H PRN Patrecia Pour, NP      ? enalapril (VASOTEC) tablet 10 mg  10 mg Oral Daily Patrecia Pour, NP   10 mg at 05/03/21 0848  ? feeding supplement (ENSURE ENLIVE / ENSURE PLUS) liquid 237 mL  237 mL Oral BID BM Parks Ranger, DO   237 mL at 05/03/21 1039  ? FLUoxetine (PROZAC) capsule 80 mg  80 mg Oral Daily Patrecia Pour, NP   80 mg at 05/03/21 0846  ? gabapentin (NEURONTIN) capsule 400 mg  400 mg Oral TID Patrecia Pour, NP   400 mg at 05/03/21 0848  ? hydrOXYzine (ATARAX) tablet 25 mg  25 mg Oral Q6H PRN Patrecia Pour, NP   25 mg at 05/03/21 1208  ? lithium carbonate (LITHOBID) CR tablet 300 mg  300 mg Oral Q12H Patrecia Pour, NP   300 mg at 05/03/21 0846  ? loperamide (IMODIUM) capsule 2-4 mg  2-4 mg Oral PRN Patrecia Pour, NP      ? LORazepam (ATIVAN) tablet 1 mg  1 mg Oral Q6H PRN Patrecia Pour, NP      ? LORazepam (ATIVAN) tablet 1 mg  1 mg Oral QID Patrecia Pour, NP   1 mg at 05/03/21 0845  ? Followed by  ? LORazepam (ATIVAN) tablet 1 mg  1 mg Oral TID Patrecia Pour, NP      ? Followed by  ? Derrill Memo ON 05/04/2021] LORazepam (ATIVAN) tablet 1 mg  1 mg Oral BID Patrecia Pour, NP      ? Followed by  ? Derrill Memo ON 05/06/2021] LORazepam (ATIVAN) tablet  1 mg  1 mg Oral Daily Lord, Jamison Y, NP      ? magnesium hydroxide (MILK OF MAGNESIA) suspension 30 mL  30 mL Oral Daily PRN Patrecia Pour, NP      ? multivitamin with minerals tablet 1 tablet  1 tablet Oral Daily Patrecia Pour, NP   1 tablet at 05/03/21 0848  ? ondansetron (ZOFRAN-ODT) disintegrating tablet 4 mg  4 mg Oral Q6H PRN Patrecia Pour, NP      ? pantoprazole (PROTONIX) EC tablet 40 mg  40 mg Oral Daily Patrecia Pour, NP   40 mg at 05/03/21 0847  ? QUEtiapine (SEROQUEL XR) 24 hr tablet 400 mg  400 mg Oral QHS Patrecia Pour, NP   400 mg at 05/02/21 2058  ? QUEtiapine (SEROQUEL) tablet 25 mg  25 mg Oral BID PRN Patrecia Pour, NP      ? risperiDONE (RISPERDAL) tablet 0.5 mg  0.5 mg Oral [id number] Parks Ranger, DO   0.5 mg at 05/03/21 1137  ? thiamine tablet 100 mg  100 mg Oral Daily Patrecia Pour, NP   100 mg at 05/03/21 0846  ? ?  PTA Medications: ?Medications Prior to Admission  ?Medication Sig Dispense Refill Last Dose  ? aspirin 325 MG EC tablet Take 1 tablet (325 mg total) by mouth daily. (Patient not taking: Reported on 04/30/2021) 30 tablet 1   ? docusate sodium (COLACE) 100 MG capsule Take 1 capsule (100 mg total) by mouth 2 (two) times daily. (Patient not taking: Reported on 04/30/2021) 60 capsule 1   ? enalapril (VASOTEC) 10 MG tablet Take 1 tablet (10 mg total) by mouth daily. 10 tablet 0   ? FLUoxetine (PROZAC) 40 MG capsule Take 2 capsules (80 mg total) by mouth daily. 60 capsule 1   ? gabapentin (NEURONTIN) 400 MG capsule Take 1 capsule (400 mg total) by mouth 3 (three) times daily. 90 capsule 1   ? lithium carbonate (LITHOBID) 300 MG CR tablet Take 1 tablet (300 mg total) by mouth every 12 (twelve) hours. 60 tablet 1   ? naltrexone (DEPADE) 50 MG tablet Take 1 tablet (50 mg total) by mouth daily. (Patient not taking: Reported on 04/30/2021) 30 tablet 1   ? pantoprazole (PROTONIX) 40 MG tablet Take 1 tablet (40 mg total) by mouth daily. 30 tablet 1   ? QUEtiapine (SEROQUEL  XR) 400 MG 24 hr tablet Take 400 mg by mouth at bedtime.     ? QUEtiapine (SEROQUEL) 25 MG tablet Take 25 mg by mouth 2 (two) times daily as needed.     ? topiramate (TOPAMAX) 25 MG tablet Take 1 tablet (25 mg total) by mouth daily. (Patient not taking: Reported on 04/30/2021) 30 tablet 0   ? ? ?Patient Stressors: Financial difficulties   ?Legal issue   ?Loss of significant relationship of 30 years (girlfriend)   ?Medication change or noncompliance   ?Substance abuse   ? ?Patient Strengths: Capable of independent living  ?Communication skills  ?Financial means  ? ?Treatment Modalities: Medication Management, Group therapy, Case management,  ?1 to 1 session with clinician, Psychoeducation, Recreational therapy. ? ? ?Physician Treatment Plan for Primary Diagnosis: Major depressive disorder, recurrent severe without psychotic features ([place]) ?Long Term Goal(s): Improvement in symptoms so as ready for discharge  ? ?Short Term Goals: Ability to identify changes in lifestyle to reduce recurrence of condition will improve ?Ability to verbalize feelings will improve ?Ability to disclose and discuss suicidal ideas ?Ability to demonstrate self-control will improve ?Ability to identify and develop effective coping behaviors will improve ?Ability to maintain clinical measurements within normal limits will improve ?Compliance with prescribed medications will improve ?Ability to identify triggers associated with substance abuse/mental health issues will improve ? ?Medication Management: Evaluate patient's response, side effects, and tolerance of medication regimen. ? ?Therapeutic Interventions: 1 to 1 sessions, Unit Group sessions and Medication administration. ? ?Evaluation of Outcomes: Not Met ? ?Physician Treatment Plan for Secondary Diagnosis: Principal Problem: ?  Major depressive disorder, recurrent severe without psychotic features ([place]) ?Active Problems: ?  Cluster B personality disorder ([place]) ? ?Long Term Goal(s):  Improvement in symptoms so as ready for discharge  ? ?Short Term Goals: Ability to identify changes in lifestyle to reduce recurrence of condition will improve ?Ability to verbalize feelings will improve ?Ability to disclose and discuss suicidal ideas ?Ability to demonstrate self-control will improve ?Ability to identify and develop effective coping behaviors will improve ?Ability to maintain clinical measurements within normal limits will improve ?Compliance with prescribed medications will improve ?Ability to identify triggers associated with substance abuse/mental health issues will improve    ? ?Medication Management: Evaluate patient's response, side effects,  and tolerance of medication regimen. ? ?Therapeutic Interventions: 1 to 1 sessions, Unit Group sessions and Medication administration. ? ?Evaluation of Outcomes: Not Met ? ? ?RN Treatment Plan for Primary Diagnosis: Major depressive disorder, recurrent severe without psychotic features ([place]) ?Long Term Goal(s): Knowledge of disease and therapeutic regimen to maintain health will improve ? ?Short Term Goals: Ability to remain free from injury will improve, Ability to verbalize frustration and anger appropriately will improve, Ability to demonstrate self-control, Ability to participate in decision making will improve, Ability to verbalize feelings will improve, Ability to disclose and discuss suicidal ideas, Ability to identify and develop effective coping behaviors will improve, and Compliance with prescribed medications will improve ? ?Medication Management: RN will administer medications as ordered by provider, will assess and evaluate patient's response and provide education to patient for prescribed medication. RN will report any adverse and/or side effects to prescribing provider. ? ?Therapeutic Interventions: 1 on 1 counseling sessions, Psychoeducation, Medication administration, Evaluate responses to treatment, Monitor vital signs and CBGs as  ordered, Perform/monitor CIWA, COWS, AIMS and Fall Risk screenings as ordered, Perform wound care treatments as ordered. ? ?Evaluation of Outcomes: Not Met ? ? ?LCSW Treatment Plan for Primary Diagnosis: Major depre

## 2021-05-03 NOTE — Plan of Care (Signed)
?  Problem: Education: ?Goal: Knowledge of [place] [facility] Education information/materials will improve ?Outcome: Progressing ?Goal: Emotional status will improve ?Outcome: Progressing ?Goal: Mental status will improve ?Outcome: Progressing ?Goal: Verbalization of understanding the information provided will improve ?Outcome: Progressing ?  ?Problem: Activity: ?Goal: Interest or engagement in activities will improve ?Outcome: Progressing ?Goal: Sleeping patterns will improve ?Outcome: Progressing ?  ?Problem: Coping: ?Goal: Ability to verbalize frustrations and anger appropriately will improve ?Outcome: Progressing ?Goal: Ability to demonstrate self-control will improve ?Outcome: Progressing ?  ?Problem: Health Behavior/Discharge Planning: ?Goal: Identification of resources available to assist in meeting health care needs will improve ?Outcome: Progressing ?Goal: Compliance with treatment plan for underlying cause of condition will improve ?Outcome: Progressing ?  ?Problem: Physical Regulation: ?Goal: Ability to maintain clinical measurements within normal limits will improve ?Outcome: Progressing ?  ?Problem: Safety: ?Goal: Periods of time without injury will increase ?Outcome: Progressing ?  ?Problem: Education: ?Goal: Utilization of techniques to improve thought processes will improve ?Outcome: Progressing ?Goal: Knowledge of the prescribed therapeutic regimen will improve ?Outcome: Progressing ?  ?Problem: Activity: ?Goal: Interest or engagement in leisure activities will improve ?Outcome: Progressing ?Goal: Imbalance in normal sleep/wake cycle will improve ?Outcome: Progressing ?  ?Problem: Coping: ?Goal: Coping ability will improve ?Outcome: Progressing ?Goal: Will verbalize feelings ?Outcome: Progressing ?  ?Problem: Health Behavior/Discharge Planning: ?Goal: Ability to make decisions will improve ?Outcome: Progressing ?Goal: Compliance with therapeutic regimen will improve ?Outcome: Progressing ?   ?Problem: Role Relationship: ?Goal: Will demonstrate positive changes in social behaviors and relationships ?Outcome: Progressing ?  ?Problem: Safety: ?Goal: Ability to disclose and discuss suicidal ideas will improve ?Outcome: Progressing ?Goal: Ability to identify and utilize support systems that promote safety will improve ?Outcome: Progressing ?  ?Problem: Self-Concept: ?Goal: Will verbalize positive feelings about self ?Outcome: Progressing ?Goal: Level of anxiety will decrease ?Outcome: Progressing ?  ?Problem: Education: ?Goal: Ability to make informed decisions regarding treatment will improve ?Outcome: Progressing ?  ?Problem: Health Behavior/Discharge Planning: ?Goal: Identification of resources available to assist in meeting health care needs will improve ?Outcome: Progressing ?  ?Problem: Medication: ?Goal: Compliance with prescribed medication regimen will improve ?Outcome: Progressing ?  ?Problem: Education: ?Goal: Knowledge of disease or condition will improve ?Outcome: Progressing ?Goal: Understanding of discharge needs will improve ?Outcome: Progressing ?  ?Problem: Health Behavior/Discharge Planning: ?Goal: Ability to identify changes in lifestyle to reduce recurrence of condition will improve ?Outcome: Progressing ?Goal: Identification of resources available to assist in meeting health care needs will improve ?Outcome: Progressing ?  ?Problem: Physical Regulation: ?Goal: Complications related to the disease process, condition or treatment will be avoided or minimized ?Outcome: Progressing ?  ?Problem: Safety: ?Goal: Ability to remain free from injury will improve ?Outcome: Progressing ?  ?Problem: Activity: ?Goal: Will identify at least one activity in which they can participate ?Outcome: Progressing ?  ?Problem: Coping: ?Goal: Demonstration of participation in decision-making regarding own care will improve ?Outcome: Progressing ?  ?Problem: Health Behavior/Discharge Planning: ?Goal:  Identification of resources available to assist in meeting health care needs will improve ?Outcome: Progressing ?  ?Problem: Self-Concept: ?Goal: Will verbalize positive feelings about self ?Outcome: Progressing ?  ?

## 2021-05-03 NOTE — Progress Notes (Signed)
Recreation Therapy Notes ? ?INPATIENT RECREATION THERAPY ASSESSMENT ? ?Patient Details ?Name: [name] ?MRN: [id number] ?DOB: [birth year] ?Today's Date: 05/03/2021 ?      ?Information Obtained From: ?Patient ? ?Able to Participate in Assessment/Interview: ?Yes ? ?Patient Presentation: ?Responsive ? ?Reason for Admission (Per Patient): ?Active Symptoms, Suicidal Ideation ? ?Patient Stressors: ?Relationship, Other (Comment) (Life) ? ?Coping Skills:   ?Isolation, Avoidance, Art ? ?Leisure Interests (2+):  ?Art - Paint, Art - Draw, Art - Coloring, Individual - TV, Music - Listen (Swim) ? ?Frequency of Recreation/Participation: ?Monthly ? ?Awareness of Community Resources:  ?Yes ? ?Community Resources:  ?Park, [employer] ? ?Current Use: ?No ? ?If no, Barriers?: ?Transportation, Financial ? ?Expressed Interest in [employer] Information: ?Yes ? ?[place] of Residence:  ?[place] ? ?Patient Main Form of Transportation: ?Walk ? ?Patient Strengths:  ?Easy to get along with ? ?Patient Identified Areas of Improvement:  ?I do not know ? ?Patient Goal for Hospitalization:  ?Get my head right ? ?Current SI (including self-harm):  ?Yes (No plan) ? ?Current HI:  ?No ? ?Current AVH: ?Yes (Voices) ? ?Staff Intervention Plan: ?Group Attendance, Collaborate with Interdisciplinary Treatment Team ? ?Consent to Intern Participation: ?N/A ? ?[name] ?05/03/2021, 3:42 PM ?

## 2021-05-03 NOTE — Progress Notes (Signed)
Recreation Therapy Notes ? ?INPATIENT RECREATION TR PLAN ? ?Patient Details ?Name: [name] ?MRN: [id number] ?DOB: 18-Aug-1960 ?Today's Date: 05/03/2021 ? ?Rec Therapy Plan ?Is patient appropriate for Therapeutic Recreation?: Yes ?Treatment times per week: at least 3 ?Estimated Length of Stay: 5-7 days ?TR Treatment/Interventions: Group participation (Comment) ? ?Discharge Criteria ?Pt will be discharged from therapy if:: Discharged ?Treatment plan/goals/alternatives discussed and agreed upon by:: Patient/family ? ?Discharge Summary ?  ? ? ?Aysiah Jurado ?05/03/2021, 3:43 PM ?
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
## 2021-05-03 NOTE — Progress Notes (Addendum)
At the beginning of the shift the pt was in his room talking the chaplin. He has been out of his room for brief periods of time, with minimal interaction with peers and staff. He denies SI/HI and he reports seeing someone walk pass his door and hearing voices to cut himself. He states he is not suicidal here, but those thought brought him to the hospital. He reports having a history of anxiety and depression. Anxiety score 7/ and depression 8/10. No signs of ETOH withdrawal with VS stable ?

## 2021-05-03 NOTE — Plan of Care (Signed)
?  Problem: Education: ?Goal: Knowledge of [facility] Education information/materials will improve ?Outcome: Progressing ?Goal: Emotional status will improve ?Outcome: Progressing ?Goal: Mental status will improve ?Outcome: Progressing ?Goal: Verbalization of understanding the information provided will improve ?Outcome: Progressing ?  ?Problem: Activity: ?Goal: Interest or engagement in activities will improve ?Outcome: Progressing ?Goal: Sleeping patterns will improve ?Outcome: Progressing ?  ?Problem: Coping: ?Goal: Ability to verbalize frustrations and anger appropriately will improve ?Outcome: Progressing ?Goal: Ability to demonstrate self-control will improve ?Outcome: Progressing ?  ?Problem: Health Behavior/Discharge Planning: ?Goal: Identification of resources available to assist in meeting health care needs will improve ?Outcome: Progressing ?Goal: Compliance with treatment plan for underlying cause of condition will improve ?Outcome: Progressing ?  ?Problem: Physical Regulation: ?Goal: Ability to maintain clinical measurements within normal limits will improve ?Outcome: Progressing ?  ?Problem: Safety: ?Goal: Periods of time without injury will increase ?Outcome: Progressing ?  ?Problem: Education: ?Goal: Utilization of techniques to improve thought processes will improve ?Outcome: Progressing ?Goal: Knowledge of the prescribed therapeutic regimen will improve ?Outcome: Progressing ?  ?Problem: Activity: ?Goal: Interest or engagement in leisure activities will improve ?Outcome: Progressing ?Goal: Imbalance in normal sleep/wake cycle will improve ?Outcome: Progressing ?  ?Problem: Coping: ?Goal: Coping ability will improve ?Outcome: Progressing ?Goal: Will verbalize feelings ?Outcome: Progressing ?  ?Problem: Health Behavior/Discharge Planning: ?Goal: Ability to make decisions will improve ?Outcome: Progressing ?Goal: Compliance with therapeutic regimen will improve ?Outcome: Progressing ?   ?Problem: Role Relationship: ?Goal: Will demonstrate positive changes in social behaviors and relationships ?Outcome: Progressing ?  ?Problem: Safety: ?Goal: Ability to disclose and discuss suicidal ideas will improve ?Outcome: Progressing ?Goal: Ability to identify and utilize support systems that promote safety will improve ?Outcome: Progressing ?  ?Problem: Self-Concept: ?Goal: Will verbalize positive feelings about self ?Outcome: Progressing ?Goal: Level of anxiety will decrease ?Outcome: Progressing ?  ?Problem: Education: ?Goal: Ability to make informed decisions regarding treatment will improve ?Outcome: Progressing ?  ?Problem: Health Behavior/Discharge Planning: ?Goal: Identification of resources available to assist in meeting health care needs will improve ?Outcome: Progressing ?  ?Problem: Medication: ?Goal: Compliance with prescribed medication regimen will improve ?Outcome: Progressing ?  ?Problem: Education: ?Goal: Knowledge of disease or condition will improve ?Outcome: Progressing ?Goal: Understanding of discharge needs will improve ?Outcome: Progressing ?  ?Problem: Health Behavior/Discharge Planning: ?Goal: Ability to identify changes in lifestyle to reduce recurrence of condition will improve ?Outcome: Progressing ?Goal: Identification of resources available to assist in meeting health care needs will improve ?Outcome: Progressing ?  ?Problem: Physical Regulation: ?Goal: Complications related to the disease process, condition or treatment will be avoided or minimized ?Outcome: Progressing ?  ?Problem: Safety: ?Goal: Ability to remain free from injury will improve ?Outcome: Progressing ?  ?Problem: Activity: ?Goal: Will identify at least one activity in which they can participate ?Outcome: Progressing ?  ?Problem: Coping: ?Goal: Demonstration of participation in decision-making regarding own care will improve ?Outcome: Progressing ?  ?Problem: Health Behavior/Discharge Planning: ?Goal:  Identification of resources available to assist in meeting health care needs will improve ?Outcome: Progressing ?  ?Problem: Self-Concept: ?Goal: Will verbalize positive feelings about self ?Outcome: Progressing ?  ?

## 2021-05-03 NOTE — Plan of Care (Signed)
D: Pt alert and oriented. Pt reports experiencing anxiety/depression, prn meds given. Pt reports experiencing 8/10 Right hand pain at this time, prn meds given. Pt denies experiencing any HI, or AVH at this time, however endorses SI w/o pain and verbally contracts for safety. ? ?A: Scheduled medications administered to pt, per MD orders. Support and encouragement provided. Frequent verbal contact made. Routine safety checks conducted q15 minutes.  ? ?R: No adverse drug reactions noted. Pt verbally contracts for safety at this time. Pt compliant with medications and treatment plan. Pt interacts well with others on the unit. Pt remains safe at this time. Will continue to monitor.  ? ?Problem: Education: ?Goal: Emotional status will improve ?Outcome: Not Progressing ?Goal: Mental status will improve ?Outcome: Not Progressing ?  ?

## 2021-05-03 NOTE — [facility] Suicide Risk Assessment (Signed)
[facility] INPATIENT:  Family/Significant Other Suicide Prevention Education ? ?Suicide Prevention Education:  ? ?SPE completed with pt, as pt refused to consent to family contact. SPI pamphlet provided to pt and pt was encouraged to share information with support network, ask questions, and talk about any concerns relating to SPE. Pt denies access to guns/firearms and verbalized understanding of information provided. Mobile Crisis information also provided to pt. ? ?Patient Refusal for Family/Significant Other Suicide Prevention Education: The patient [name] has refused to provide written consent for family/significant other to be provided Family/Significant Other Suicide Prevention Education during admission and/or prior to discharge.  Physician notified. ? ?[name] ?05/03/2021, 3:59 PM ?
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
## 2021-05-03 NOTE — B[facility] Group Notes (Signed)
[facility] Group Notes:  (Nursing/MHT/Case Management/Adjunct) ? ?Date:  05/03/2021  ?Time:  10:00 AM ? ?Type of Therapy:  Psychoeducational Skills ? ?Participation Level:  Did Not Attend ? ?Participation Quality:   N/A ? ?Affect:   N/A ? ?Cognitive:   N/A ? ?Insight:  None ? ?Engagement in Group:   N/A ? ?Modes of Intervention:   N/A ? ?Summary of Progress/Problems: ?The pt. was in treatment team at the beginning of the group but never came to the group when treatment team was over. ?Barbaraann Rondo ?05/03/2021, 11:21 AM ?
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
## 2021-05-04 DIAGNOSIS — F332 Major depressive disorder, recurrent severe without psychotic features: Secondary | ICD-10-CM | POA: Diagnosis not present

## 2021-05-04 LAB — URINE DRUG SCREEN, QUALITATIVE (ARMC ONLY)
Amphetamines, Ur Screen: NOT DETECTED
Barbiturates, Ur Screen: NOT DETECTED
Benzodiazepine, Ur Scrn: NOT DETECTED
Cannabinoid 50 Ng, Ur ~~LOC~~: POSITIVE — AB
Cocaine Metabolite,Ur ~~LOC~~: POSITIVE — AB
MDMA (Ecstasy)Ur Screen: NOT DETECTED
Methadone Scn, Ur: NOT DETECTED
Opiate, Ur Screen: NOT DETECTED
Phencyclidine (PCP) Ur S: NOT DETECTED
Tricyclic, Ur Screen: NOT DETECTED

## 2021-05-04 NOTE — Progress Notes (Signed)
[facility] MD Progress Note ? ?05/04/2021 1:22 PM ?[place]  ?MRN:  [id number] ?Subjective: [name] is seen on rounds today.  He is taking his medications as prescribed and denies any side effects.  He is interacting well with staff and peers.  She supposedly has some legal charges and court dates coming up.  He is able to contract for safety in the hospital.  He does not want social work to talk to any of his family.  He feels depressed and hopeless and helpless.  He is able to contract for safety in the hospital.  He does not want to go to drug and alcohol rehab at this time. ? ?Principal Problem: Major depressive disorder, recurrent severe without psychotic features ([facility]) ?Diagnosis: Principal Problem: ?  Major depressive disorder, recurrent severe without psychotic features ([place]) ?Active Problems: ?  Cluster B personality disorder ([place]) ? ?Total Time spent with patient: 15 minutes ? ?Past Psychiatric History: Yes ? ?Past Medical History:  ?Past Medical History:  ?Diagnosis Date  ? Anxiety   ? Arthritis   ? knees and hands  ? Bipolar disorder ([place])   ? Depression   ? GERD (gastroesophageal reflux disease)   ? Hepatitis   ? HEP "C"  ? History of kidney stones   ? Hypertension   ? 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 Kidney stones   ? Pericarditis 05/2015  ? a. echo 5/17: EF 60-65%, no RWMA, LV dias fxn nl, LA mildly dilated, RV sys fxn nl, PASP nl, moderate sized circumferential pericardial effusion was identified, 2.12 cm around the LV free wall, <1 cm around the RV free wall. Features were not c/w tamponade physiology  ? PTSD (post-traumatic stress disorder)   ? Witnessed brother's suicide.  ? Restless leg syndrome   ? Syncope   ?  ?Past Surgical History:  ?Procedure Laterality Date  ? CYSTOSCOPY WITH URETEROSCOPY AND STENT PLACEMENT    ? ESOPHAGOGASTRODUODENOSCOPY N/A 01/11/2016  ? Procedure: ESOPHAGOGASTRODUODENOSCOPY (EGD);  Surgeon: Wilford Corner, MD;  Location: [facility] ENDOSCOPY;  Service:  Endoscopy;  Laterality: N/A;  ? ESOPHAGOGASTRODUODENOSCOPY N/A 04/09/2020  ? Procedure: ESOPHAGOGASTRODUODENOSCOPY (EGD);  Surgeon: Jonathon Bellows, MD;  Location: [facility] ENDOSCOPY;  Service: Gastroenterology;  Laterality: N/A;  ? INCISION AND DRAINAGE ABSCESS Left 01/02/2018  ? Procedure: INCISION AND DRAINAGE LEFT KNEE;  Surgeon: Earnestine Leys, MD;  Location: [facility] ORS;  Service: Orthopedics;  Laterality: Left;  ? JOINT REPLACEMENT Right   ? TKR  ? KNEE ARTHROSCOPY Right 06/25/2014  ? Procedure: ARTHROSCOPY KNEE;  Surgeon: Earnestine Leys, MD;  Location: [facility] ORS;  Service: Orthopedics;  Laterality: Right;  partial arthroscopic medial menisectomy  ? TOTAL KNEE ARTHROPLASTY Right 04/22/2015  ? Procedure: TOTAL KNEE ARTHROPLASTY;  Surgeon: Earnestine Leys, MD;  Location: [facility] ORS;  Service: Orthopedics;  Laterality: Right;  ? TOTAL KNEE ARTHROPLASTY Left 10/30/2017  ? Procedure: TOTAL KNEE ARTHROPLASTY;  Surgeon: Earnestine Leys, MD;  Location: [facility] ORS;  Service: Orthopedics;  Laterality: Left;  ? TOTAL KNEE REVISION Left 01/02/2018  ? Procedure: poly exchange of tibia and patella left knee;  Surgeon: Earnestine Leys, MD;  Location: [facility] ORS;  Service: Orthopedics;  Laterality: Left;  ? ?Family History:  ?Family History  ?Problem Relation Age of Onset  ? CVA Mother   ?     deceased at age 43  ? Depression Brother   ?     Died by suicide at age 68  ? ? ?Social History:  ?Social History  ? ?Substance  and Sexual Activity  ?Alcohol Use Yes  ?   ?Social History  ? ?Substance and Sexual Activity  ?Drug Use Yes  ? Types: Marijuana  ?  ?Social History  ? ?Socioeconomic History  ? Marital status: Single  ?  Spouse name: Not on file  ? Number of children: Not on file  ? Years of education: Not on file  ? Highest education level: Not on file  ?Occupational History  ? Not on file  ?Tobacco Use  ? Smoking status: Former  ?  Packs/day: 0.75  ?  Years: 20.00  ?  Pack years: 15.00  ?  Types: Cigarettes  ?  Quit date: 05/16/1984  ?  Years since  quitting: 36.9  ? Smokeless tobacco: Never  ?Vaping Use  ? Vaping Use: Never used  ?Substance and Sexual Activity  ? Alcohol use: Yes  ? Drug use: Yes  ?  Types: Marijuana  ? Sexual activity: Not on file  ?Other Topics Concern  ? Not on file  ?Social History Narrative  ? Not on file  ? ?Social Determinants of Health  ? ?Financial Resource Strain: Not on file  ?Food Insecurity: Not on file  ?Transportation Needs: Not on file  ?Physical Activity: Not on file  ?Stress: Not on file  ?Social Connections: Not on file  ? ?Additional Social History:  ?  ?  ?  ?  ?  ?  ?  ?  ?  ?  ?  ? ?Sleep: Good ? ?Appetite:  Good ? ?Current Medications: ?Current Facility-Administered Medications  ?Medication Dose Route Frequency Provider Last Rate Last Admin  ? acetaminophen (TYLENOL) tablet 650 mg  650 mg Oral Q6H PRN Patrecia Pour, NP   650 mg at 05/03/21 1208  ? alum & mag hydroxide-simeth (MAALOX/MYLANTA) 200-200-20 MG/5ML suspension 30 mL  30 mL Oral Q4H PRN Patrecia Pour, NP      ? enalapril (VASOTEC) tablet 10 mg  10 mg Oral Daily Patrecia Pour, NP   10 mg at 05/03/21 0848  ? feeding supplement (ENSURE ENLIVE / ENSURE PLUS) liquid 237 mL  237 mL Oral BID BM Parks Ranger, DO   237 mL at 05/04/21 0915  ? FLUoxetine (PROZAC) capsule 80 mg  80 mg Oral Daily Patrecia Pour, NP   80 mg at 05/04/21 [id number]  ? gabapentin (NEURONTIN) capsule 400 mg  400 mg Oral TID Patrecia Pour, NP   400 mg at 05/04/21 [id number]  ? hydrOXYzine (ATARAX) tablet 25 mg  25 mg Oral Q6H PRN Patrecia Pour, NP   25 mg at 05/03/21 2114  ? lithium carbonate (LITHOBID) CR tablet 300 mg  300 mg Oral Q12H Patrecia Pour, NP   300 mg at 05/04/21 [id number]  ? loperamide (IMODIUM) capsule 2-4 mg  2-4 mg Oral PRN Patrecia Pour, NP      ? LORazepam (ATIVAN) tablet 1 mg  1 mg Oral Q6H PRN Patrecia Pour, NP      ? LORazepam (ATIVAN) tablet 1 mg  1 mg Oral TID Patrecia Pour, NP   1 mg at 05/04/21 [id number]  ? Followed by  ? LORazepam (ATIVAN) tablet 1 mg  1 mg Oral  BID Patrecia Pour, NP      ? Followed by  ? Derrill Memo ON 05/06/2021] LORazepam (ATIVAN) tablet 1 mg  1 mg Oral Daily Patrecia Pour, NP      ? magnesium hydroxide (MILK OF MAGNESIA) suspension 30 mL  30 mL Oral Daily PRN Patrecia Pour, NP      ? multivitamin with minerals tablet 1 tablet  1 tablet Oral Daily Patrecia Pour, NP   1 tablet at 05/04/21 [id number]  ? ondansetron (ZOFRAN-ODT) disintegrating tablet 4 mg  4 mg Oral Q6H PRN Patrecia Pour, NP      ? pantoprazole (PROTONIX) EC tablet 40 mg  40 mg Oral Daily Patrecia Pour, NP   40 mg at 05/04/21 [id number]  ? QUEtiapine (SEROQUEL XR) 24 hr tablet 400 mg  400 mg Oral QHS Patrecia Pour, NP   400 mg at 05/03/21 2112  ? QUEtiapine (SEROQUEL) tablet 25 mg  25 mg Oral BID PRN Patrecia Pour, NP      ? risperiDONE (RISPERDAL) tablet 0.5 mg  0.5 mg Oral [id number] Parks Ranger, DO   0.5 mg at 05/04/21 [id number]  ? thiamine tablet 100 mg  100 mg Oral Daily Patrecia Pour, NP   100 mg at 05/04/21 [id number]  ? ? ?Lab Results: No results found for this or any previous visit (from the past 48 hour(s)). ? ?Blood Alcohol level:  ?Lab Results  ?Component Value Date  ? ETH 156 (H) 04/30/2021  ? ETH 121 (H) 01/17/2021  ? ? ?Metabolic Disorder Labs: ?Lab Results  ?Component Value Date  ? HGBA1C 5.3 01/18/2021  ? MPG 105.41 01/18/2021  ? MPG 116.89 01/21/2019  ? ?No results found for: PROLACTIN ?Lab Results  ?Component Value Date  ? CHOL 202 (H) 01/18/2021  ? TRIG 172 (H) 01/18/2021  ? HDL 40 (L) 01/18/2021  ? CHOLHDL 5.1 01/18/2021  ? VLDL 34 01/18/2021  ? LDLCALC 128 (H) 01/18/2021  ? [place] 98 01/07/2016  ? ? ?Physical Findings: ?AIMS: Facial and Oral Movements ?Muscles of Facial Expression: None, normal ?Lips and Perioral Area: None, normal ?Jaw: None, normal ?Tongue: None, normal,Extremity Movements ?Upper (arms, wrists, hands, fingers): None, normal ?Lower (legs, knees, ankles, toes): None, normal, Trunk Movements ?Neck, shoulders, hips: None, normal, Overall  Severity ?Severity of abnormal movements (highest score from questions above): None, normal ?Incapacitation due to abnormal movements: None, normal ?Patient's awareness of abnormal movements (rate only patient's report

## 2021-05-04 NOTE — Plan of Care (Signed)
Patient remain alert and oriented X3, calm and cooperative during assessment. Endorsed anxiety of 8/10 and depression of 9/10. Patient stated "I'm anxious cause I have a court date coming up in may". Patient endorse SI and AVH. Patient stated " I hear voices telling me to cut myself." Patient also stated " I'm seeing people going down the hallway." Verbally contract for safety while on the unit.  Compliant with all due medications and tolerated well. Ate meals in the day room among peers. Q15 minute safety checks in place. ? ?Problem: Education: ?Goal: Knowledge of [facility] Education information/materials will improve ?Outcome: Progressing ?Goal: Emotional status will improve ?Outcome: Progressing ?Goal: Mental status will improve ?Outcome: Progressing ?Goal: Verbalization of understanding the information provided will improve ?Outcome: Progressing ?  ?Problem: Activity: ?Goal: Interest or engagement in activities will improve ?Outcome: Progressing ?Goal: Sleeping patterns will improve ?Outcome: Progressing ?  ?Problem: Coping: ?Goal: Ability to verbalize frustrations and anger appropriately will improve ?Outcome: Progressing ?Goal: Ability to demonstrate self-control will improve ?Outcome: Progressing ?  ?Problem: Health Behavior/Discharge Planning: ?Goal: Identification of resources available to assist in meeting health care needs will improve ?Outcome: Progressing ?Goal: Compliance with treatment plan for underlying cause of condition will improve ?Outcome: Progressing ?  ?Problem: Physical Regulation: ?Goal: Ability to maintain clinical measurements within normal limits will improve ?Outcome: Progressing ?  ?Problem: Safety: ?Goal: Periods of time without injury will increase ?Outcome: Progressing ?  ?Problem: Education: ?Goal: Utilization of techniques to improve thought processes will improve ?Outcome: Progressing ?Goal: Knowledge of the prescribed therapeutic regimen will improve ?Outcome:  Progressing ?  ?Problem: Activity: ?Goal: Interest or engagement in leisure activities will improve ?Outcome: Progressing ?Goal: Imbalance in normal sleep/wake cycle will improve ?Outcome: Progressing ?  ?Problem: Coping: ?Goal: Coping ability will improve ?Outcome: Progressing ?Goal: Will verbalize feelings ?Outcome: Progressing ?  ?Problem: Health Behavior/Discharge Planning: ?Goal: Ability to make decisions will improve ?Outcome: Progressing ?Goal: Compliance with therapeutic regimen will improve ?Outcome: Progressing ?  ?Problem: Role Relationship: ?Goal: Will demonstrate positive changes in social behaviors and relationships ?Outcome: Progressing ?  ?Problem: Safety: ?Goal: Ability to disclose and discuss suicidal ideas will improve ?Outcome: Progressing ?Goal: Ability to identify and utilize support systems that promote safety will improve ?Outcome: Progressing ?  ?Problem: Self-Concept: ?Goal: Will verbalize positive feelings about self ?Outcome: Progressing ?Goal: Level of anxiety will decrease ?Outcome: Progressing ?  ?Problem: Education: ?Goal: Ability to make informed decisions regarding treatment will improve ?Outcome: Progressing ?  ?Problem: Health Behavior/Discharge Planning: ?Goal: Identification of resources available to assist in meeting health care needs will improve ?Outcome: Progressing ?  ?Problem: Medication: ?Goal: Compliance with prescribed medication regimen will improve ?Outcome: Progressing ?  ?Problem: Education: ?Goal: Knowledge of disease or condition will improve ?Outcome: Progressing ?Goal: Understanding of discharge needs will improve ?Outcome: Progressing ?  ?Problem: Health Behavior/Discharge Planning: ?Goal: Ability to identify changes in lifestyle to reduce recurrence of condition will improve ?Outcome: Progressing ?Goal: Identification of resources available to assist in meeting health care needs will improve ?Outcome: Progressing ?  ?Problem: Physical Regulation: ?Goal:  Complications related to the disease process, condition or treatment will be avoided or minimized ?Outcome: Progressing ?  ?Problem: Safety: ?Goal: Ability to remain free from injury will improve ?Outcome: Progressing ?  ?Problem: Activity: ?Goal: Will identify at least one activity in which they can participate ?Outcome: Progressing ?  ?Problem: Coping: ?Goal: Demonstration of participation in decision-making regarding own care will improve ?Outcome: Progressing ?  ?Problem: Health Behavior/Discharge Planning: ?  Goal: Identification of resources available to assist in meeting health care needs will improve ?Outcome: Progressing ?  ?Problem: Self-Concept: ?Goal: Will verbalize positive feelings about self ?Outcome: Progressing ?  ?

## 2021-05-04 NOTE — Progress Notes (Signed)
Pt visible on the unit, he interacts with peers and staff. Attends group with participation. Pt complained of anxiety x 1 and  I gave him a prn. He reports passive SI thoughts not plans. He report hearing voices and seeing things, he is  not acting on the voices. He contracts for safety. No HI. No signs of ETOH withdrawal. Rt hand healing, skin cuts sutures intact, no signs of infection.  ?

## 2021-05-04 NOTE — [facility] Group Notes (Signed)
[facility] Group Notes:  (Nursing/MHT/Case Management/Adjunct) ? ?Date:  05/04/2021  ?Time: 10:00 AM ? ?Type of Therapy:  Psychoeducational Skills ? ?Participation Level:  Active ? ?Participation Quality:  Appropriate, Attentive, Sharing, and Supportive ? ?Affect:  Appropriate ? ?Cognitive:  Appropriate ? ?Insight:  Appropriate ? ?Engagement in Group:  Engaged ? ?Modes of Intervention:  Discussion ? ?Summary of Progress/Problems: ?The pt attended group and was very attentive. The pt participated in group discussion. ?Barbaraann Rondo ?05/04/2021, 12:31 PM ?

## 2021-05-05 DIAGNOSIS — F332 Major depressive disorder, recurrent severe without psychotic features: Secondary | ICD-10-CM | POA: Diagnosis not present

## 2021-05-05 MED ORDER — TRAZODONE HCL 50 MG PO TABS
25.0000 mg | ORAL_TABLET | ORAL | Status: DC | PRN
  Administered 2021-05-06 – 2021-05-11 (×11): 25 mg via ORAL
  Filled 2021-05-05 (×11): qty 1

## 2021-05-05 MED ORDER — OLANZAPINE 5 MG PO TABS
5.0000 mg | ORAL_TABLET | ORAL | Status: DC
  Administered 2021-05-05 – 2021-05-08 (×7): 5 mg via ORAL
  Filled 2021-05-05 (×7): qty 1

## 2021-05-05 MED ORDER — DOXEPIN HCL 50 MG PO CAPS
50.0000 mg | ORAL_CAPSULE | Freq: Every day | ORAL | Status: DC
  Administered 2021-05-05 – 2021-05-06 (×2): 50 mg via ORAL
  Filled 2021-05-05 (×2): qty 1

## 2021-05-05 NOTE — Group Note (Signed)
LCSW Group Therapy Note ? ?Group Date: 05/05/2021 ?Start Time: 1315 ?End Time: 1400 ? ? ?Type of Therapy and Topic:  Group Therapy - Healthy vs Unhealthy Coping Skills ? ?Participation Level:  Did Not Attend  ? ?Description of Group ?The focus of this group was to determine what unhealthy coping techniques typically are used by group members and what healthy coping techniques would be helpful in coping with various problems. Patients were guided in becoming aware of the differences between healthy and unhealthy coping techniques. Patients were asked to identify 2-3 healthy coping skills they would like to learn to use more effectively. ? ?Therapeutic Goals ?Patients learned that coping is what human beings do all day long to deal with various situations in their lives ?Patients defined and discussed healthy vs unhealthy coping techniques ?Patients identified their preferred coping techniques and identified whether these were healthy or unhealthy ?Patients determined 2-3 healthy coping skills they would like to become more familiar with and use more often. ?Patients provided support and ideas to each other ? ? ?Summary of Patient Progress:   ?X ? ?Therapeutic Modalities ?Cognitive Behavioral Therapy ?Motivational Interviewing ? ?Clydie Dillen A [place], LCSWA ?05/05/2021  3:20 PM   ?

## 2021-05-05 NOTE — Plan of Care (Signed)
Patient remains alert and oriented to person, time, place and situation. Endorsed anxiety of 10/10. Patient stated "I'm anxious about everything that is going in my life." Endorsed SI Patient stated " It's a constant thought that's running through my mind." Endorsed AVH stating " I'm hearing voices telling me to hurt myself, and seeing things going by, I know there's nothing there but I see the shadow going by." Complain of stabbing headache medicate with PRN Tylenol. Reports sleeping good last night. Patient stated " The medication they give me really help, I slept all night." ? ?Problem: Education: ?Goal: Knowledge of [facility] Education information/materials will improve ?Outcome: Progressing ?Goal: Emotional status will improve ?Outcome: Progressing ?Goal: Mental status will improve ?Outcome: Progressing ?Goal: Verbalization of understanding the information provided will improve ?Outcome: Progressing ?  ?Problem: Activity: ?Goal: Interest or engagement in activities will improve ?Outcome: Progressing ?Goal: Sleeping patterns will improve ?Outcome: Progressing ?  ?Problem: Coping: ?Goal: Ability to verbalize frustrations and anger appropriately will improve ?Outcome: Progressing ?Goal: Ability to demonstrate self-control will improve ?Outcome: Progressing ?  ?Problem: Health Behavior/Discharge Planning: ?Goal: Identification of resources available to assist in meeting health care needs will improve ?Outcome: Progressing ?Goal: Compliance with treatment plan for underlying cause of condition will improve ?Outcome: Progressing ?  ?Problem: Physical Regulation: ?Goal: Ability to maintain clinical measurements within normal limits will improve ?Outcome: Progressing ?  ?Problem: Safety: ?Goal: Periods of time without injury will increase ?Outcome: Progressing ?  ?Problem: Education: ?Goal: Utilization of techniques to improve thought processes will improve ?Outcome: Progressing ?Goal: Knowledge of the  prescribed therapeutic regimen will improve ?Outcome: Progressing ?  ?Problem: Activity: ?Goal: Interest or engagement in leisure activities will improve ?Outcome: Progressing ?Goal: Imbalance in normal sleep/wake cycle will improve ?Outcome: Progressing ?  ?Problem: Coping: ?Goal: Coping ability will improve ?Outcome: Progressing ?Goal: Will verbalize feelings ?Outcome: Progressing ?  ?Problem: Health Behavior/Discharge Planning: ?Goal: Ability to make decisions will improve ?Outcome: Progressing ?Goal: Compliance with therapeutic regimen will improve ?Outcome: Progressing ?  ?Problem: Role Relationship: ?Goal: Will demonstrate positive changes in social behaviors and relationships ?Outcome: Progressing ?  ?Problem: Safety: ?Goal: Ability to disclose and discuss suicidal ideas will improve ?Outcome: Progressing ?Goal: Ability to identify and utilize support systems that promote safety will improve ?Outcome: Progressing ?  ?Problem: Self-Concept: ?Goal: Will verbalize positive feelings about self ?Outcome: Progressing ?Goal: Level of anxiety will decrease ?Outcome: Progressing ?  ?Problem: Education: ?Goal: Ability to make informed decisions regarding treatment will improve ?Outcome: Progressing ?  ?Problem: Health Behavior/Discharge Planning: ?Goal: Identification of resources available to assist in meeting health care needs will improve ?Outcome: Progressing ?  ?Problem: Medication: ?Goal: Compliance with prescribed medication regimen will improve ?Outcome: Progressing ?  ?Problem: Education: ?Goal: Knowledge of disease or condition will improve ?Outcome: Progressing ?Goal: Understanding of discharge needs will improve ?Outcome: Progressing ?  ?Problem: Health Behavior/Discharge Planning: ?Goal: Ability to identify changes in lifestyle to reduce recurrence of condition will improve ?Outcome: Progressing ?Goal: Identification of resources available to assist in meeting health care needs will improve ?Outcome:  Progressing ?  ?Problem: Physical Regulation: ?Goal: Complications related to the disease process, condition or treatment will be avoided or minimized ?Outcome: Progressing ?  ?Problem: Safety: ?Goal: Ability to remain free from injury will improve ?Outcome: Progressing ?  ?Problem: Activity: ?Goal: Will identify at least one activity in which they can participate ?Outcome: Progressing ?  ?Problem: Coping: ?Goal: Demonstration of participation in decision-making regarding own care will improve ?Outcome: Progressing ?  ?  Problem: Health Behavior/Discharge Planning: ?Goal: Identification of resources available to assist in meeting health care needs will improve ?Outcome: Progressing ?  ?Problem: Self-Concept: ?Goal: Will verbalize positive feelings about self ?Outcome: Progressing ?  ?

## 2021-05-05 NOTE — Progress Notes (Signed)
?   05/05/21 2030  ?Psych Admission Type (Psych Patients Only)  ?Admission Status Involuntary  ?Psychosocial Assessment  ?Patient Complaints Anxiety;Depression;Self-harm thoughts  ?Eye Contact Fair  ?Facial Expression Anxious;Worried  ?Affect Depressed;Anxious  ?Speech Logical/coherent  ?Interaction Assertive  ?Motor Activity Slow  ?Appearance/Hygiene Unremarkable;In scrubs  ?Behavior Characteristics Cooperative;Appropriate to situation;Anxious  ?Mood Anxious;Depressed  ?Thought Process  ?Coherency WDL  ?Content Blaming others  ?Delusions None reported or observed  ?Perception Hallucinations  ?Hallucination None reported or observed  ?Judgment Poor  ?Confusion None  ?Danger to Self  ?Current suicidal ideation? Passive ?(says thoughts are always there)  ?Self-Injurious Behavior No self-injurious ideation or behavior indicators observed or expressed   ?Agreement Not to Harm Self Yes  ?Description of Agreement verbal  ?Danger to Others  ?Danger to Others None reported or observed  ? ?Pt seen in dayroom. Reported to this Probation officer that he had been having thoughts of harming himself for the past hour. Pt denies any plan and verbally contracted for safety. Pt denies HI. Pt rates pain 6.5/10 in his right hand. Pt has stitches across his knuckles. Area is reddened. Pt states that he doesn't remember what led to his hand being injured. Pt rates anxiety 8/10 and depression 9/10. Pt is worried because he hasn't heard from his girlfriend who was moved to a facility last July d/t dementia and being deemed incompetent. "She had COVID last week and they moved her to a rehab place. She was staying at [facility] on [address]. We've been together for 30 years. Without me, she would have nobody." Pt calmed down a bit after talking. Pt given reassurance and emotional support.  ?Pt took scheduled meds as ordered. Pt given PRNs as appropriate. 15 min checks for safety. ?Pt safe on unit. ?

## 2021-05-05 NOTE — Progress Notes (Signed)
[facility] ? ?05/05/2021 10:52 AM ?[name]  ?MRN:  [id number] ?Subjective: Victor FearingJames continues to complain of racing thoughts and suicidal ideation along with anxiety.  He states that he cannot get it to stop.  I talked to him about some medication changes.  Dr. Toni Amendlapacs and started him on lithium couple months ago but he states that he has not noticed any difference.  He is able to contract for safety in the hospital. ? ?Principal Problem: Major depressive disorder, recurrent severe without psychotic features (HCC) ?Diagnosis: Principal Problem: ?  Major depressive disorder, recurrent severe without psychotic features (HCC) ?Active Problems: ?  Cluster B personality disorder (HCC) ? ?Total Time spent with patient: 15 minutes ? ?Past Psychiatric History: History of alcohol abuse and chronic depression and mood instability.  [facility] and [facility].  He has had past diagnosis of major depressive disorder with and without psychotic features and bipolar disorder.  Obviously he has a history of cluster B personality disorder. ? ?Past Medical History:  ?Past Medical History:  ?Diagnosis Date  ? Anxiety   ? Arthritis   ? knees and hands  ? Bipolar disorder (HCC)   ? Depression   ? GERD (gastroesophageal reflux disease)   ? Hepatitis   ? HEP "C"  ? History of kidney stones   ? Hypertension   ? 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 Kidney stones   ? Pericarditis 05/2015  ? a. echo 5/17: EF 60-65%, no RWMA, LV dias fxn nl, LA mildly dilated, RV sys fxn nl, PASP nl, moderate sized circumferential pericardial effusion was identified, 2.12 cm around the LV free wall, <1 cm around the RV free wall. Features were not c/w tamponade physiology  ? PTSD (post-traumatic stress disorder)   ? Witnessed brother's suicide.  ? Restless leg syndrome   ? Syncope   ?  ?Past Surgical History:  ?Procedure Laterality Date  ? CYSTOSCOPY WITH URETEROSCOPY AND STENT  PLACEMENT    ? ESOPHAGOGASTRODUODENOSCOPY N/A 01/11/2016  ? Procedure: ESOPHAGOGASTRODUODENOSCOPY (EGD);  Surgeon: Charlott RakesVincent Schooler, MD;  Location[facility] ENDOSCOPY;  Service: Endoscopy;  Laterality: N/A;  ? ESOPHAGOGASTRODUODENOSCOPY N/A 04/09/2020  ? Procedure: ESOPHAGOGASTRODUODENOSCOPY (EGD);  Surgeon: Wyline MoodAnna, Kiran, MD;  Location: [facility] ENDOSCOPY;  Service: Gastroenterology;  Laterality: N/A;  ? INCISION AND DRAINAGE ABSCESS Left 01/02/2018  ? Procedure: INCISION AND DRAINAGE LEFT KNEE;  Surgeon: Deeann SaintMiller, Howard, MD;  Location: [facility] ORS;  Service: Orthopedics;  Laterality: Left;  ? JOINT REPLACEMENT Right   ? TKR  ? KNEE ARTHROSCOPY Right 06/25/2014  ? Procedure: ARTHROSCOPY KNEE;  Surgeon: Deeann SaintHoward Miller, MD;  Location: [facility] ORS;  Service: Orthopedics;  Laterality: Right;  partial arthroscopic medial menisectomy  ? TOTAL KNEE ARTHROPLASTY Right 04/22/2015  ? Procedure: TOTAL KNEE ARTHROPLASTY;  Surgeon: Deeann SaintHoward Miller, MD;  Location: [facility] ORS;  Service: Orthopedics;  Laterality: Right;  ? TOTAL KNEE ARTHROPLASTY Left 10/30/2017  ? Procedure: TOTAL KNEE ARTHROPLASTY;  Surgeon: Deeann SaintMiller, Howard, MD;  Location: [facility] ORS;  Service: Orthopedics;  Laterality: Left;  ? TOTAL KNEE REVISION Left 01/02/2018  ? Procedure: poly exchange of tibia and patella left knee;  Surgeon: Deeann SaintMiller, Howard, MD;  Location: [facility] ORS;  Service: Orthopedics;  Laterality: Left;  ? ?Family History:  ?Family History  ?Problem Relation Age of Onset  ? CVA Mother   ?     deceased at age 59  ? Depression Brother   ?  Died by suicide at age 35  ? ? ?Social History:  ?Social History  ? ?Substance and Sexual Activity  ?Alcohol Use Yes  ?   ?Social History  ? ?Substance and Sexual Activity  ?Drug Use Yes  ? Types: Marijuana  ?  ?Social History  ? ?Socioeconomic History  ? Marital status: Single  ?  Spouse name: Not on file  ? Number of children: Not on file  ? Years of education: Not on file  ? Highest education level: Not on file  ?Occupational History  ? Not  on file  ?Tobacco Use  ? Smoking status: Former  ?  Packs/day: 0.75  ?  Years: 20.00  ?  Pack years: 15.00  ?  Types: Cigarettes  ?  Quit date: 05/16/1984  ?  Years since quitting: 36.9  ? Smokeless tobacco: Never  ?Vaping Use  ? Vaping Use: Never used  ?Substance and Sexual Activity  ? Alcohol use: Yes  ? Drug use: Yes  ?  Types: Marijuana  ? Sexual activity: Not on file  ?Other Topics Concern  ? Not on file  ?Social History Narrative  ? Not on file  ? ?Social Determinants of Health  ? ?Financial Resource Strain: Not on file  ?Food Insecurity: Not on file  ?Transportation Needs: Not on file  ?Physical Activity: Not on file  ?Stress: Not on file  ?Social Connections: Not on file  ? ?Additional Social History:  ?  ?  ?  ?  ?  ?  ?  ?  ?  ?  ?  ? ?Sleep: Poor ? ?Appetite:  Fair ? ?Current Medications: ?Current Facility-Administered Medications  ?Medication Dose Route Frequency Provider Last Rate Last Admin  ? acetaminophen (TYLENOL) tablet 650 mg  650 mg Oral Q6H PRN Charm Rings, NP   650 mg at 05/05/21 0818  ? alum & mag hydroxide-simeth (MAALOX/MYLANTA) 200-200-20 MG/5ML suspension 30 mL  30 mL Oral Q4H PRN Charm Rings, NP      ? doxepin (SINEQUAN) capsule 50 mg  50 mg Oral QHS Sarina Ill, DO      ? enalapril (VASOTEC) tablet 10 mg  10 mg Oral Daily Charm Rings, NP   10 mg at 05/05/21 5009  ? feeding supplement (ENSURE ENLIVE / ENSURE PLUS) liquid 237 mL  237 mL Oral BID BM Sarina Ill, DO   237 mL at 05/05/21 0931  ? FLUoxetine (PROZAC) capsule 80 mg  80 mg Oral Daily Charm Rings, NP   80 mg at 05/05/21 3818  ? gabapentin (NEURONTIN) capsule 400 mg  400 mg Oral TID Charm Rings, NP   400 mg at 05/05/21 2993  ? loperamide (IMODIUM) capsule 2-4 mg  2-4 mg Oral PRN Charm Rings, NP      ? magnesium hydroxide (MILK OF MAGNESIA) suspension 30 mL  30 mL Oral Daily PRN Charm Rings, NP      ? multivitamin with minerals tablet 1 tablet  1 tablet Oral Daily Charm Rings, NP    1 tablet at 05/05/21 7169  ? OLANZapine (ZYPREXA) tablet 5 mg  5 mg Oral [id number] Sarina Ill, DO      ? ondansetron (ZOFRAN-ODT) disintegrating tablet 4 mg  4 mg Oral Q6H PRN Charm Rings, NP      ? pantoprazole (PROTONIX) EC tablet 40 mg  40 mg Oral Daily Charm Rings, NP   40 mg at 05/05/21 6789  ? thiamine  tablet 100 mg  100 mg Oral Daily Charm Rings, NP   100 mg at 05/05/21 3790  ? traZODone (DESYREL) tablet 25 mg  25 mg Oral Q4H PRN Sarina Ill, DO      ? ? ?Lab Results: No results found for this or any previous visit (from the past 48 hour(s)). ? ?Blood Alcohol level:  ?Lab Results  ?Component Value Date  ? ETH 156 (H) 04/30/2021  ? ETH 121 (H) 01/17/2021  ? ? ?Metabolic Disorder Labs: ?Lab Results  ?Component Value Date  ? HGBA1C 5.3 01/18/2021  ? MPG 105.41 01/18/2021  ? MPG 116.89 01/21/2019  ? ?No results found for: PROLACTIN ?Lab Results  ?Component Value Date  ? CHOL 202 (H) 01/18/2021  ? TRIG 172 (H) 01/18/2021  ? HDL 40 (L) 01/18/2021  ? CHOLHDL 5.1 01/18/2021  ? VLDL 34 01/18/2021  ? LDLCALC 128 (H) 01/18/2021  ? LDLCALC 98 01/07/2016  ? ? ?Physical Findings: ?AIMS: Facial and Oral Movements ?Muscles of Facial Expression: None, normal ?Lips and Perioral Area: None, normal ?Jaw: None, normal ?Tongue: None, normal,Extremity Movements ?Upper (arms, wrists, hands, fingers): None, normal ?Lower (legs, knees, ankles, toes): None, normal, Trunk Movements ?Neck, shoulders, hips: None, normal, Overall Severity ?Severity of abnormal movements (highest score from questions above): None, normal ?Incapacitation due to abnormal movements: None, normal ?Patient's awareness of abnormal movements (rate only patient's report): No Awareness, Dental Status ?Current problems with teeth and/or dentures?: No ?Does patient usually wear dentures?: No  ?CIWA:  CIWA-Ar Total: 0 ?COWS:    ? ?Musculoskeletal: ?Strength & Muscle Tone: within normal limits ?Gait & Station: normal ?Patient leans:  N/A ? ?Psychiatric Specialty Exam: ? ?Presentation  ?General Appearance: Disheveled; Casual ? ?Eye Contact:Fair ? ?Speech:Clear and Coherent ? ?Speech Volume:Normal ? ?Handedness:Right ? ? ?Mood and Af

## 2021-05-05 NOTE — Progress Notes (Signed)
Pt lying in bed with eyes open; calm, cooperative. Pt states "I can feel better. I got a lot of anxiety and depression." He c/o pain on back of R hand which he rates 6/10; healing lacerations noted. He endorses SI without a plan. He currently denies HI. He endorses auditory and visual hallucinations. He says "I know there's nothing there, but I'll see a trace. And I hear voices telling me to hurt myself"; he denies hearing voices that tell him to hurt others. He reports that he has been sleeping "not very well", as he "tosses and turns all night". He describes his appetite as "good" and states "I'm tired of eating." He expresses feelings of anxiety and depression, both of which he rates 8/10 and says "My girlfriend of 30 years was taken away from me. She has dementia." He states that he has a court date next month in [place] because "they say that I spent her (his girlfriend) money after she was put into a nursing home. No acute distress noted. ?

## 2021-05-06 DIAGNOSIS — F332 Major depressive disorder, recurrent severe without psychotic features: Secondary | ICD-10-CM | POA: Diagnosis not present

## 2021-05-06 NOTE — [facility] Group Notes (Signed)
[facility] Group Notes:  (Nursing/MHT/Case Management/Adjunct) ? ?Date:  05/06/2021  ?Time:  11:25 AM ? ?Type of Therapy:  Psychoeducational Skills ? ?Participation Level:  Active ? ?Participation Quality:  Appropriate, Attentive, and Sharing ? ?Affect:  Appropriate and Excited ? ?Cognitive:  Alert and Appropriate ? ?Insight:  Improving ? ?Engagement in Group:  Engaged and Improving ? ?Modes of Intervention:  Activity, Discussion, Education, and Support ? ?Summary of Progress/Problems: ? ? He was very engaged in group, sharing stories and laughing.  ? ?[name] ?05/06/2021, 11:25 AM ?

## 2021-05-06 NOTE — Progress Notes (Signed)
Recreation Therapy Notes ? ?Date: 05/06/2021 ? ?Time: 1:40 PM   ? ?Location: Craft room   ? ?Behavioral response: Appropriate ? ?Intervention Topic: Wellness     ? ?Discussion/Intervention:  ?Group content today was focused on Wellness. The group defined wellness and some positive ways they make decisions for themselves. Individuals expressed reasons why they neglected any wellness in the past. Patients described ways to improve wellness skills in the future. The group explained what could happen if they did not do any wellness at all. Participants express how bad choices has affected them and others around them. Individual explained the importance of wellness. The group participated in the intervention ?Testing my Wellness? where they had a chance to identify some of their weaknesses and strengths in wellness.  ?Clinical Observations/Feedback: ?Patient came to group and identified physical wellness as being healthy and staying on medication. He expressed that he can participate in wellness  by working out. Individual was social with peers and staff while participating in the intervention.  ?Ory Elting LRT/CTRS  ? ? ? ? ? ? ? ? ? ? ?Lourdes Kucharski ?05/06/2021 3:20 PM ?

## 2021-05-06 NOTE — Progress Notes (Signed)
Patient is alert and oriented to person, time, place and situation. Endorsed anxiety of 10/10. Patient stated "I'm anxious about everything that is going in my life." Endorsed SI Patient stated " It's a constant thought that's running through my mind." Endorsed AVH stating " I'm hearing voices telling me to hurt myself, and seeing things going by, I know there's nothing there but I see the shadow going by." Reports sleeping good last night. ?  ?

## 2021-05-06 NOTE — Plan of Care (Signed)
?  Problem: Education: ?Goal: Knowledge of [place] [facility] Education information/materials will improve ?Outcome: Progressing ?Goal: Emotional status will improve ?Outcome: Progressing ?Goal: Mental status will improve ?Outcome: Progressing ?Goal: Verbalization of understanding the information provided will improve ?Outcome: Progressing ?  ?Problem: Activity: ?Goal: Interest or engagement in activities will improve ?Outcome: Progressing ?Goal: Sleeping patterns will improve ?Outcome: Progressing ?  ?Problem: Coping: ?Goal: Ability to verbalize frustrations and anger appropriately will improve ?Outcome: Progressing ?Goal: Ability to demonstrate self-control will improve ?Outcome: Progressing ?  ?Problem: Health Behavior/Discharge Planning: ?Goal: Identification of resources available to assist in meeting health care needs will improve ?Outcome: Progressing ?Goal: Compliance with treatment plan for underlying cause of condition will improve ?Outcome: Progressing ?  ?Problem: Physical Regulation: ?Goal: Ability to maintain clinical measurements within normal limits will improve ?Outcome: Progressing ?  ?Problem: Safety: ?Goal: Periods of time without injury will increase ?Outcome: Progressing ?  ?Problem: Activity: ?Goal: Interest or engagement in leisure activities will improve ?Outcome: Progressing ?Goal: Imbalance in normal sleep/wake cycle will improve ?Outcome: Progressing ?  ?Problem: Coping: ?Goal: Coping ability will improve ?Outcome: Progressing ?Goal: Will verbalize feelings ?Outcome: Progressing ?  ?Problem: Health Behavior/Discharge Planning: ?Goal: Ability to make decisions will improve ?Outcome: Progressing ?Goal: Compliance with therapeutic regimen will improve ?Outcome: Progressing ?  ?Problem: Role Relationship: ?Goal: Will demonstrate positive changes in social behaviors and relationships ?Outcome: Progressing ?  ?Problem: Safety: ?Goal: Ability to disclose and discuss suicidal ideas will  improve ?Outcome: Progressing ?Goal: Ability to identify and utilize support systems that promote safety will improve ?Outcome: Progressing ?  ?Problem: Self-Concept: ?Goal: Will verbalize positive feelings about self ?Outcome: Progressing ?Goal: Level of anxiety will decrease ?Outcome: Progressing ?  ?Problem: Education: ?Goal: Ability to make informed decisions regarding treatment will improve ?Outcome: Progressing ?  ?Problem: Health Behavior/Discharge Planning: ?Goal: Identification of resources available to assist in meeting health care needs will improve ?Outcome: Progressing ?  ?Problem: Medication: ?Goal: Compliance with prescribed medication regimen will improve ?Outcome: Progressing ?  ?Problem: Education: ?Goal: Knowledge of disease or condition will improve ?Outcome: Progressing ?Goal: Understanding of discharge needs will improve ?Outcome: Progressing ?  ?Problem: Health Behavior/Discharge Planning: ?Goal: Ability to identify changes in lifestyle to reduce recurrence of condition will improve ?Outcome: Progressing ?Goal: Identification of resources available to assist in meeting health care needs will improve ?Outcome: Progressing ?  ?Problem: Physical Regulation: ?Goal: Complications related to the disease process, condition or treatment will be avoided or minimized ?Outcome: Progressing ?  ?Problem: Safety: ?Goal: Ability to remain free from injury will improve ?Outcome: Progressing ?  ?Problem: Activity: ?Goal: Will identify at least one activity in which they can participate ?Outcome: Progressing ?  ?

## 2021-05-06 NOTE — Plan of Care (Signed)
?  Problem: Health Behavior/Discharge Planning: ?Goal: Compliance with treatment plan for underlying cause of condition will improve ?Outcome: Progressing ?  ?Problem: Physical Regulation: ?Goal: Ability to maintain clinical measurements within normal limits will improve ?Outcome: Progressing ?  ?Problem: Safety: ?Goal: Periods of time without injury will increase ?Outcome: Progressing ?  ?Problem: Education: ?Goal: Knowledge of the prescribed therapeutic regimen will improve ?Outcome: Progressing ?  ?Problem: Activity: ?Goal: Imbalance in normal sleep/wake cycle will improve ?Outcome: Progressing ?  ?Problem: Coping: ?Goal: Coping ability will improve ?Outcome: Progressing ?Goal: Will verbalize feelings ?Outcome: Progressing ?  ?

## 2021-05-06 NOTE — Progress Notes (Signed)
[facility] MD Progress Note ? ?05/06/2021 11:01 AM ?[name]  ?MRN:  [id number] ?Subjective: We made a lot of medication changes yesterday and he states that he does feel a little bit better.  He still feels depressed and anxious but overall states he feels a little bit better.  He is able to contract for safety in the hospital.  He does have some court date coming up.  He denies any side effects from his medications.  Still has some racing thoughts and some suicidal thoughts but he states they are less. ? ?Principal Problem: Major depressive disorder, recurrent severe without psychotic features (HCC) ?Diagnosis: Principal Problem: ?  Major depressive disorder, recurrent severe without psychotic features (HCC) ?Active Problems: ?  Cluster B personality disorder (HCC) ? ?Total Time spent with patient: 15 minutes ? ?Past Psychiatric History:  History of alcohol abuse and chronic depression and mood instability.  He has been to [facility] in the past and outpatient at [facility].  He has had past diagnosis of major depressive disorder with and without psychotic features and bipolar disorder.  Obviously he has a history of cluster B personality disorder. ? ?Past Medical History:  ?Past Medical History:  ?Diagnosis Date  ? Anxiety   ? Arthritis   ? knees and hands  ? Bipolar disorder (HCC)   ? Depression   ? GERD (gastroesophageal reflux disease)   ? Hepatitis   ? HEP "C"  ? History of kidney stones   ? Hypertension   ? 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 Kidney stones   ? Pericarditis 05/2015  ? a. echo 5/17: EF 60-65%, no RWMA, LV dias fxn nl, LA mildly dilated, RV sys fxn nl, PASP nl, moderate sized circumferential pericardial effusion was identified, 2.12 cm around the LV free wall, <1 cm around the RV free wall. Features were not c/w tamponade physiology  ? PTSD (post-traumatic stress disorder)   ? Witnessed brother's suicide.  ? Restless leg syndrome   ? Syncope   ?  ?Past Surgical  History:  ?Procedure Laterality Date  ? CYSTOSCOPY WITH URETEROSCOPY AND STENT PLACEMENT    ? ESOPHAGOGASTRODUODENOSCOPY N/A 01/11/2016  ? Procedure: ESOPHAGOGASTRODUODENOSCOPY (EGD);  Surgeon: Charlott Rakes, MD;  Location: [facility] ENDOSCOPY;  Service: Endoscopy;  Laterality: N/A;  ? ESOPHAGOGASTRODUODENOSCOPY N/A 04/09/2020  ? Procedure: ESOPHAGOGASTRODUODENOSCOPY (EGD);  Surgeon: [name], MD;  Location: [facility] ENDOSCOPY;  Service: Gastroenterology;  Laterality: N/A;  ? INCISION AND DRAINAGE ABSCESS Left 01/02/2018  ? Procedure: INCISION AND DRAINAGE LEFT KNEE;  Surgeon: Deeann Saint, MD;  Location: [facility] ORS;  Service: Orthopedics;  Laterality: Left;  ? JOINT REPLACEMENT Right   ? TKR  ? KNEE ARTHROSCOPY Right 06/25/2014  ? Procedure: ARTHROSCOPY KNEE;  Surgeon: Deeann Saint, MD;  Location: [facility] ORS;  Service: Orthopedics;  Laterality: Right;  partial arthroscopic medial menisectomy  ? TOTAL KNEE ARTHROPLASTY Right 04/22/2015  ? Procedure: TOTAL KNEE ARTHROPLASTY;  Surgeon: Deeann Saint, MD;  Location: [facility] ORS;  Service: Orthopedics;  Laterality: Right;  ? TOTAL KNEE ARTHROPLASTY Left 10/30/2017  ? Procedure: TOTAL KNEE ARTHROPLASTY;  Surgeon: Deeann Saint, MD;  Location: [facility] ORS;  Service: Orthopedics;  Laterality: Left;  ? TOTAL KNEE REVISION Left 01/02/2018  ? Procedure: poly exchange of tibia and patella left knee;  Surgeon: Deeann Saint, MD;  Location: [facility] ORS;  Service: Orthopedics;  Laterality: Left;  ? ?Family History:  ?Family History  ?Problem Relation Age of Onset  ? CVA Mother   ?  deceased at age 59  ? Depression Brother   ?     Died by suicide at age 89  ? ? ?Social History:  ?Social History  ? ?Substance and Sexual Activity  ?Alcohol Use Yes  ?   ?Social History  ? ?Substance and Sexual Activity  ?Drug Use Yes  ? Types: Marijuana  ?  ?Social History  ? ?Socioeconomic History  ? Marital status: Single  ?  Spouse name: Not on file  ? Number of children: Not on file  ? Years of education: Not  on file  ? Highest education level: Not on file  ?Occupational History  ? Not on file  ?Tobacco Use  ? Smoking status: Former  ?  Packs/day: 0.75  ?  Years: 20.00  ?  Pack years: 15.00  ?  Types: Cigarettes  ?  Quit date: 05/16/1984  ?  Years since quitting: 36.9  ? Smokeless tobacco: Never  ?Vaping Use  ? Vaping Use: Never used  ?Substance and Sexual Activity  ? Alcohol use: Yes  ? Drug use: Yes  ?  Types: Marijuana  ? Sexual activity: Not on file  ?Other Topics Concern  ? Not on file  ?Social History Narrative  ? Not on file  ? ?Social Determinants of Health  ? ?Financial Resource Strain: Not on file  ?Food Insecurity: Not on file  ?Transportation Needs: Not on file  ?Physical Activity: Not on file  ?Stress: Not on file  ?Social Connections: Not on file  ? ?Additional Social History:  ?  ?  ?  ?  ?  ?  ?  ?  ?  ?  ?  ? ?Sleep: Good ? ?Appetite:  Good ? ?Current Medications: ?Current Facility-Administered Medications  ?Medication Dose Route Frequency Provider Last Rate Last Admin  ? acetaminophen (TYLENOL) tablet 650 mg  650 mg Oral Q6H PRN Charm RingsLord, Jamison Y, NP   650 mg at 05/05/21 2121  ? alum & mag hydroxide-simeth (MAALOX/MYLANTA) 200-200-20 MG/5ML suspension 30 mL  30 mL Oral Q4H PRN Charm RingsLord, Jamison Y, NP      ? doxepin (SINEQUAN) capsule 50 mg  50 mg Oral QHS Sarina IllHerrick, Natajah Derderian Edward, DO   50 mg at 05/05/21 2120  ? enalapril (VASOTEC) tablet 10 mg  10 mg Oral Daily Charm RingsLord, Jamison Y, NP   10 mg at 05/06/21 0840  ? feeding supplement (ENSURE ENLIVE / ENSURE PLUS) liquid 237 mL  237 mL Oral BID BM Sarina IllHerrick, Clemencia Helzer Edward, DO   237 mL at 05/06/21 [id number]  ? FLUoxetine (PROZAC) capsule 80 mg  80 mg Oral Daily Charm RingsLord, Jamison Y, NP   80 mg at 05/06/21 0840  ? gabapentin (NEURONTIN) capsule 400 mg  400 mg Oral TID Charm RingsLord, Jamison Y, NP   400 mg at 05/06/21 [id number]  ? magnesium hydroxide (MILK OF MAGNESIA) suspension 30 mL  30 mL Oral Daily PRN Charm RingsLord, Jamison Y, NP      ? multivitamin with minerals tablet 1 tablet  1 tablet Oral Daily  Charm RingsLord, Jamison Y, NP   1 tablet at 05/06/21 0840  ? OLANZapine (ZYPREXA) tablet 5 mg  5 mg Oral [id number] Sarina IllHerrick, Akeem Heppler Edward, DO   5 mg at 05/06/21 [id number]  ? pantoprazole (PROTONIX) EC tablet 40 mg  40 mg Oral Daily Charm RingsLord, Jamison Y, NP   40 mg at 05/06/21 0840  ? thiamine tablet 100 mg  100 mg Oral Daily Charm RingsLord, Jamison Y, NP   100 mg at 05/06/21 [id number]  ?  traZODone (DESYREL) tablet 25 mg  25 mg Oral Q4H PRN Sarina Ill, DO      ? ? ?Lab Results: No results found for this or any previous visit (from the past 48 hour(s)). ? ?Blood Alcohol level:  ?Lab Results  ?Component Value Date  ? ETH 156 (H) 04/30/2021  ? ETH 121 (H) 01/17/2021  ? ? ?Metabolic Disorder Labs: ?Lab Results  ?Component Value Date  ? HGBA1C 5.3 01/18/2021  ? MPG 105.41 01/18/2021  ? MPG 116.89 01/21/2019  ? ?No results found for: PROLACTIN ?Lab Results  ?Component Value Date  ? CHOL 202 (H) 01/18/2021  ? TRIG 172 (H) 01/18/2021  ? HDL 40 (L) 01/18/2021  ? CHOLHDL 5.1 01/18/2021  ? VLDL 34 01/18/2021  ? LDLCALC 128 (H) 01/18/2021  ? LDLCALC 98 01/07/2016  ? ? ?Physical Findings: ?AIMS: Facial and Oral Movements ?Muscles of Facial Expression: None, normal ?Lips and Perioral Area: None, normal ?Jaw: None, normal ?Tongue: None, normal,Extremity Movements ?Upper (arms, wrists, hands, fingers): None, normal ?Lower (legs, knees, ankles, toes): None, normal, Trunk Movements ?Neck, shoulders, hips: None, normal, Overall Severity ?Severity of abnormal movements (highest score from questions above): None, normal ?Incapacitation due to abnormal movements: None, normal ?Patient's awareness of abnormal movements (rate only patient's report): No Awareness, Dental Status ?Current problems with teeth and/or dentures?: No ?Does patient usually wear dentures?: No  ?CIWA:  CIWA-Ar Total: 0 ?COWS:    ? ?Musculoskeletal: ?Strength & Muscle Tone: within normal limits ?Gait & Station: normal ?Patient leans: N/A ? ?Psychiatric Specialty Exam: ? ?Presentation   ?General Appearance: Disheveled; Casual ? ?Eye Contact:Fair ? ?Speech:Clear and Coherent ? ?Speech Volume:Normal ? ?Handedness:Right ? ? ?Mood and Affect  ?Mood:Anxious; Depressed; Hopeless ? ?Affect:Appropriate

## 2021-05-06 NOTE — Plan of Care (Signed)
?  Problem: Education: ?Goal: Mental status will improve ?Outcome: Progressing ?  ?Problem: Activity: ?Goal: Sleeping patterns will improve ?Outcome: Progressing ?  ?Problem: Coping: ?Goal: Ability to verbalize frustrations and anger appropriately will improve ?Outcome: Progressing ?  ?Problem: Health Behavior/Discharge Planning: ?Goal: Identification of resources available to assist in meeting health care needs will improve ?Outcome: Progressing ?  ?Problem: Physical Regulation: ?Goal: Ability to maintain clinical measurements within normal limits will improve ?Outcome: Progressing ?  ?Problem: Safety: ?Goal: Periods of time without injury will increase ?Outcome: Progressing ?  ?Problem: Education: ?Goal: Knowledge of the prescribed therapeutic regimen will improve ?Outcome: Progressing ?  ?Problem: Activity: ?Goal: Imbalance in normal sleep/wake cycle will improve ?Outcome: Progressing ?  ?Problem: Coping: ?Goal: Coping ability will improve ?Outcome: Progressing ?Goal: Will verbalize feelings ?Outcome: Progressing ?  ?Problem: Safety: ?Goal: Ability to disclose and discuss suicidal ideas will improve ?Outcome: Progressing ?  ?
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
## 2021-05-07 ENCOUNTER — Encounter (HOSPITAL_COMMUNITY): Payer: Self-pay | Admitting: Radiology

## 2021-05-07 DIAGNOSIS — F332 Major depressive disorder, recurrent severe without psychotic features: Secondary | ICD-10-CM | POA: Diagnosis not present

## 2021-05-07 MED ORDER — FLUOXETINE HCL 20 MG PO CAPS
60.0000 mg | ORAL_CAPSULE | Freq: Every day | ORAL | Status: DC
  Administered 2021-05-08 – 2021-05-12 (×5): 60 mg via ORAL
  Filled 2021-05-07 (×6): qty 3

## 2021-05-07 MED ORDER — DOXEPIN HCL 100 MG PO CAPS
100.0000 mg | ORAL_CAPSULE | Freq: Every day | ORAL | Status: DC
  Administered 2021-05-07 – 2021-05-11 (×5): 100 mg via ORAL
  Filled 2021-05-07 (×5): qty 1

## 2021-05-07 NOTE — Progress Notes (Signed)
[facility] ? ?05/07/2021 11:49 AM ?Victor FearingJames Colon [name]  ?MRN:  [id number] ?Subjective: Victor FearingJames is seen on rounds today.  He states that he is feeling better.  He states that his auditory hallucinations have decreased and so have his suicidal thoughts.  His biggest complaint is that he did not sleep very well last night.  I told him we could make some adjustments. ? ?Principal Problem: Major depressive disorder, recurrent severe without psychotic features (HCC) ?Diagnosis: Principal Problem: ?  Major depressive disorder, recurrent severe without psychotic features (HCC) ?Active Problems: ?  Cluster B personality disorder (HCC) ? ?Total Time spent with patient: 15 minutes ? ?Past Psychiatric History:  History of alcohol abuse and chronic depression and mood instability.  He has [facility] and [facility].  He has had past diagnosis of major depressive disorder with and without psychotic features and bipolar disorder.  Obviously he has a history of cluster B personality disorder. ? ?Past Medical History:  ?Past Medical History:  ?Diagnosis Date  ? Anxiety   ? Arthritis   ? knees and hands  ? Bipolar disorder (HCC)   ? Depression   ? GERD (gastroesophageal reflux disease)   ? Hepatitis   ? HEP "C"  ? History of kidney stones   ? Hypertension   ? 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 Kidney stones   ? Pericarditis 05/2015  ? a. echo 5/17: EF 60-65%, no RWMA, LV dias fxn nl, LA mildly dilated, RV sys fxn nl, PASP nl, moderate sized circumferential pericardial effusion was identified, 2.12 cm around the LV free wall, <1 cm around the RV free wall. Features were not c/w tamponade physiology  ? PTSD (post-traumatic stress disorder)   ? Witnessed brother's suicide.  ? Restless leg syndrome   ? Syncope   ?  ?Past Surgical History:  ?Procedure Laterality Date  ? CYSTOSCOPY WITH URETEROSCOPY AND STENT PLACEMENT    ? ESOPHAGOGASTRODUODENOSCOPY N/A 01/11/2016  ? Procedure:  ESOPHAGOGASTRODUODENOSCOPY (EGD);  Surgeon: Charlott RakesVincent Schooler, MD;  [facility];  Service: Endoscopy;  Laterality: N/A;  ? ESOPHAGOGASTRODUODENOSCOPY N/A 04/09/2020  ? Procedure: ESOPHAGOGASTRODUODENOSCOPY (EGD);  Surgeon: Wyline MoodAnna, Kiran, MD;  [facility];  Service: Gastroenterology;  Laterality: N/A;  ? INCISION AND DRAINAGE ABSCESS Left 01/02/2018  ? Procedure: INCISION AND DRAINAGE LEFT KNEE;  Surgeon: Deeann SaintMiller, Howard, MD;  Location: [facility] ORS;  Service: Orthopedics;  Laterality: Left;  ? JOINT REPLACEMENT Right   ? TKR  ? KNEE ARTHROSCOPY Right 06/25/2014  ? Procedure: ARTHROSCOPY KNEE;  Surgeon: Deeann SaintHoward Miller, MD;  Location: [facility] ORS;  Service: Orthopedics;  Laterality: Right;  partial arthroscopic medial menisectomy  ? TOTAL KNEE ARTHROPLASTY Right 04/22/2015  ? Procedure: TOTAL KNEE ARTHROPLASTY;  Surgeon: Deeann SaintHoward Miller, MD;  Location: [facility] ORS;  Service: Orthopedics;  Laterality: Right;  ? TOTAL KNEE ARTHROPLASTY Left 10/30/2017  ? Procedure: TOTAL KNEE ARTHROPLASTY;  Surgeon: Deeann SaintMiller, Howard, MD;  Location: [facility] ORS;  Service: Orthopedics;  Laterality: Left;  ? TOTAL KNEE REVISION Left 01/02/2018  ? Procedure: poly exchange of tibia and patella left knee;  Surgeon: Deeann SaintMiller, Howard, MD;  Location: [facility] ORS;  Service: Orthopedics;  Laterality: Left;  ? ?Family History:  ?Family History  ?Problem Relation Age of Onset  ? CVA Mother   ?     deceased at age 59  ? Depression Brother   ?     Died by suicide at age 61  ? ? ?Social  History:  ?Social History  ? ?Substance and Sexual Activity  ?Alcohol Use Yes  ?   ?Social History  ? ?Substance and Sexual Activity  ?Drug Use Yes  ? Types: Marijuana  ?  ?Social History  ? ?Socioeconomic History  ? Marital status: Single  ?  Spouse name: Not on file  ? Number of children: Not on file  ? Years of education: Not on file  ? Highest education level: Not on file  ?Occupational History  ? Not on file  ?Tobacco Use  ? Smoking status: Former  ?  Packs/day: 0.75   ?  Years: 20.00  ?  Pack years: 15.00  ?  Types: Cigarettes  ?  Quit date: 05/16/1984  ?  Years since quitting: 37.0  ? Smokeless tobacco: Never  ?Vaping Use  ? Vaping Use: Never used  ?Substance and Sexual Activity  ? Alcohol use: Yes  ? Drug use: Yes  ?  Types: Marijuana  ? Sexual activity: Not on file  ?Other Topics Concern  ? Not on file  ?Social History Narrative  ? Not on file  ? ?Social Determinants of Health  ? ?Financial Resource Strain: Not on file  ?Food Insecurity: Not on file  ?Transportation Needs: Not on file  ?Physical Activity: Not on file  ?Stress: Not on file  ?Social Connections: Not on file  ? ?Additional Social History:  ?  ?  ?  ?  ?  ?  ?  ?  ?  ?  ?  ? ?Sleep: Poor ? ?Appetite:  Good ? ?Current Medications: ?Current Facility-Administered Medications  ?Medication Dose Route Frequency Provider Last Rate Last Admin  ? acetaminophen (TYLENOL) tablet 650 mg  650 mg Oral Q6H PRN Charm Rings, NP   650 mg at 05/07/21 0851  ? alum & mag hydroxide-simeth (MAALOX/MYLANTA) 200-200-20 MG/5ML suspension 30 mL  30 mL Oral Q4H PRN Charm Rings, NP      ? doxepin (SINEQUAN) capsule 100 mg  100 mg Oral QHS Sarina Ill, DO      ? enalapril (VASOTEC) tablet 10 mg  10 mg Oral Daily Charm Rings, NP   10 mg at 05/07/21 9371  ? feeding supplement (ENSURE ENLIVE / ENSURE PLUS) liquid 237 mL  237 mL Oral BID BM Sarina Ill, DO   237 mL at 05/07/21 0856  ? [START ON 05/08/2021] FLUoxetine (PROZAC) capsule 60 mg  60 mg Oral Daily Sarina Ill, DO      ? gabapentin (NEURONTIN) capsule 400 mg  400 mg Oral TID Charm Rings, NP   400 mg at 05/07/21 6967  ? magnesium hydroxide (MILK OF MAGNESIA) suspension 30 mL  30 mL Oral Daily PRN Charm Rings, NP      ? multivitamin with minerals tablet 1 tablet  1 tablet Oral Daily Charm Rings, NP   1 tablet at 05/07/21 8938  ? OLANZapine (ZYPREXA) tablet 5 mg  5 mg Oral [id number] Sarina Ill, DO   5 mg at 05/07/21 1017   ? pantoprazole (PROTONIX) EC tablet 40 mg  40 mg Oral Daily Charm Rings, NP   40 mg at 05/07/21 5102  ? thiamine tablet 100 mg  100 mg Oral Daily Charm Rings, NP   100 mg at 05/07/21 5852  ? traZODone (DESYREL) tablet 25 mg  25 mg Oral Q4H PRN Sarina Ill, DO   25 mg at 05/07/21 1037  ? ? ?Lab Results: No  results found for this or any previous visit (from the past 48 hour(s)). ? ?Blood Alcohol level:  ?Lab Results  ?Component Value Date  ? ETH 156 (H) 04/30/2021  ? ETH 121 (H) 01/17/2021  ? ? ?Metabolic Disorder Labs: ?Lab Results  ?Component Value Date  ? HGBA1C 5.3 01/18/2021  ? MPG 105.41 01/18/2021  ? MPG 116.89 01/21/2019  ? ?No results found for: PROLACTIN ?Lab Results  ?Component Value Date  ? CHOL 202 (H) 01/18/2021  ? TRIG 172 (H) 01/18/2021  ? HDL 40 (L) 01/18/2021  ? CHOLHDL 5.1 01/18/2021  ? VLDL 34 01/18/2021  ? LDLCALC 128 (H) 01/18/2021  ? LDLCALC 98 01/07/2016  ? ? ?Physical Findings: ?AIMS: Facial and Oral Movements ?Muscles of Facial Expression: None, normal ?Lips and Perioral Area: None, normal ?Jaw: None, normal ?Tongue: None, normal,Extremity Movements ?Upper (arms, wrists, hands, fingers): None, normal ?Lower (legs, knees, ankles, toes): None, normal, Trunk Movements ?Neck, shoulders, hips: None, normal, Overall Severity ?Severity of abnormal movements (highest score from questions above): None, normal ?Incapacitation due to abnormal movements: None, normal ?Patient's awareness of abnormal movements (rate only patient's report): No Awareness, Dental Status ?Current problems with teeth and/or dentures?: No ?Does patient usually wear dentures?: No  ?CIWA:  CIWA-Ar Total: 0 ?COWS:    ? ?Musculoskeletal: ?Strength & Muscle Tone: within normal limits ?Gait & Station: normal ?Patient leans: N/A ? ?Psychiatric Specialty Exam: ? ?Presentation  ?General Appearance: Disheveled; Casual ? ?Eye Contact:Fair ? ?Speech:Clear and Coherent ? ?Speech  Volume:Normal ? ?Handedness:Right ? ? ?Mood and Affect  ?Mood:Anxious; Depressed; Hopeless ? ?Affect:Appropriate; Congruent; Depressed ? ? ?Thought Process  ?Thought Processes:Coherent ? ?Descriptions of Associations:Intact ? ?Orientation:Ful

## 2021-05-07 NOTE — Plan of Care (Signed)
Patient is alert and oriented to person, time, place and situation. Endorsed anxiety of 10/10. Patient stated "I'm anxious about everything that is going in my life." Endorsed SI Patient stated " It's a constant thought that's running through my mind." Endorsed AVH stating "I'm hearing voices telling me to hurt myself, and seeing things going by, I know there's nothing there but I see the shadow going by." Reports sleeping good last night. ? ?Problem: Education: ?Goal: Knowledge of [facility] Education information/materials will improve ?Outcome: Progressing ?Goal: Emotional status will improve ?Outcome: Progressing ?Goal: Mental status will improve ?Outcome: Progressing ?Goal: Verbalization of understanding the information provided will improve ?Outcome: Progressing ?  ?Problem: Activity: ?Goal: Interest or engagement in activities will improve ?Outcome: Progressing ?Goal: Sleeping patterns will improve ?Outcome: Progressing ?  ?Problem: Coping: ?Goal: Ability to verbalize frustrations and anger appropriately will improve ?Outcome: Progressing ?Goal: Ability to demonstrate self-control will improve ?Outcome: Progressing ?  ?Problem: Health Behavior/Discharge Planning: ?Goal: Identification of resources available to assist in meeting health care needs will improve ?Outcome: Progressing ?Goal: Compliance with treatment plan for underlying cause of condition will improve ?Outcome: Progressing ?  ?Problem: Physical Regulation: ?Goal: Ability to maintain clinical measurements within normal limits will improve ?Outcome: Progressing ?  ?Problem: Safety: ?Goal: Periods of time without injury will increase ?Outcome: Progressing ?  ?Problem: Education: ?Goal: Utilization of techniques to improve thought processes will improve ?Outcome: Progressing ?Goal: Knowledge of the prescribed therapeutic regimen will improve ?Outcome: Progressing ?  ?Problem: Coping: ?Goal: Coping ability will improve ?Outcome:  Progressing ?Goal: Will verbalize feelings ?Outcome: Progressing ?  ?

## 2021-05-07 NOTE — Progress Notes (Signed)
Pt says a friend, Wilber Bihari, will bring his wallet to the hospital for him. Pt told that this is acceptable and it will be locked up with his other belongings and returned at discharge. Pt verbalized understanding. ?

## 2021-05-07 NOTE — Plan of Care (Signed)
?  Problem: Education: ?Goal: Emotional status will improve ?Outcome: Progressing ?Goal: Mental status will improve ?Outcome: Progressing ?  ?Problem: Activity: ?Goal: Interest or engagement in activities will improve ?Outcome: Progressing ?Goal: Sleeping patterns will improve ?Outcome: Progressing ?  ?Problem: Coping: ?Goal: Ability to verbalize frustrations and anger appropriately will improve ?Outcome: Progressing ?Goal: Ability to demonstrate self-control will improve ?Outcome: Progressing ?  ?Problem: Physical Regulation: ?Goal: Ability to maintain clinical measurements within normal limits will improve ?Outcome: Progressing ?  ?Problem: Safety: ?Goal: Periods of time without injury will increase ?Outcome: Progressing ?  ?Problem: Education: ?Goal: Knowledge of the prescribed therapeutic regimen will improve ?Outcome: Progressing ?  ?Problem: Activity: ?Goal: Imbalance in normal sleep/wake cycle will improve ?Outcome: Progressing ?  ?Problem: Coping: ?Goal: Will verbalize feelings ?Outcome: Progressing ?  ?Problem: Role Relationship: ?Goal: Will demonstrate positive changes in social behaviors and relationships ?Outcome: Progressing ?  ?

## 2021-05-07 NOTE — Progress Notes (Signed)
?   05/07/21 2025  ?Psych Admission Type (Psych Patients Only)  ?Admission Status Involuntary  ?Psychosocial Assessment  ?Patient Complaints Anxiety;Depression;Other (Comment);Self-harm thoughts ?(AH)  ?Eye Contact Fair  ?Facial Expression Anxious;Sad  ?Affect Anxious;Depressed  ?Speech Logical/coherent  ?Interaction Assertive  ?Motor Activity Slow  ?Appearance/Hygiene In scrubs  ?Behavior Characteristics Cooperative;Anxious  ?Mood Anxious;Depressed;Pleasant  ?Thought Process  ?Coherency WDL  ?Content Preoccupation ?(regret over his actions leading to being in the hospital)  ?Delusions None reported or observed  ?Perception Hallucinations  ?Hallucination Auditory  ?Judgment Impaired  ?Confusion None  ?Danger to Self  ?Current suicidal ideation? Passive  ?Self-Injurious Behavior No self-injurious ideation or behavior indicators observed or expressed   ?Agreement Not to Harm Self Yes  ?Description of Agreement verbal  ?Danger to Others  ?Danger to Others None reported or observed  ? ?Pt seen in dayroom. Pt endorses passive SI but contracts for safety. Pt denies HI, VH. Pt endorses AH. Rates pain 3/10 as tension headache and pain 4/10 in his right hand where he has the stitches. Pt has full range of motion in hand. Stitches clean, dry and intact. Pt rates anxiety and depression both 10/10 d/t issue with another pt in dayroom earlier in the day. Pt was given PRNs for anxiety at that time but says he is still upset and anxious. Pt contributes his headache and tension in his neck to his anxiety. Pt states he discussed with provider that his Doxepin would be increased. Confirmed that increased dose was ordered for this evening. ?Pt given scheduled medications as prescribed. PRNs given as appropriate. 15 minute checks for safety. ?Pt safe on unit. ?

## 2021-05-07 NOTE — Progress Notes (Signed)
?   05/07/21 1215  ?Clinical Encounter Type  ?Visited With Patient  ?Visit Type Follow-up  ?Referral From Patient  ?Consult/Referral To Chaplain  ?Spiritual Encounters  ?Spiritual Needs Emotional;Other (Comment) ?(social)  ? ?Chaplain Burris made a f/u visit. Pt seems vibrant and hopeful. Pt described offering support and engagement with fellow patients. Chaplain B offered compassionate presence and active listening in support of Pt who indicates a lot of gratitude for the visits. ? ? ?

## 2021-05-08 DIAGNOSIS — F332 Major depressive disorder, recurrent severe without psychotic features: Secondary | ICD-10-CM | POA: Diagnosis not present

## 2021-05-08 MED ORDER — OLANZAPINE 5 MG PO TABS
5.0000 mg | ORAL_TABLET | Freq: Every morning | ORAL | Status: DC
  Administered 2021-05-09 – 2021-05-12 (×4): 5 mg via ORAL
  Filled 2021-05-08 (×4): qty 1

## 2021-05-08 MED ORDER — OLANZAPINE 5 MG PO TABS
10.0000 mg | ORAL_TABLET | Freq: Every evening | ORAL | Status: DC
Start: 2021-05-08 — End: 2021-05-12
  Administered 2021-05-08 – 2021-05-11 (×4): 10 mg via ORAL
  Filled 2021-05-08 (×4): qty 2

## 2021-05-08 NOTE — Progress Notes (Signed)
Patient stayed in the dayroom all day. Pleasant and cooperative. Complained of pain in his right hand at the injury site. Patient was active in the milieu with no additional concerns.  ?

## 2021-05-08 NOTE — Progress Notes (Signed)
[facility] MD Progress Note ? ?05/08/2021 3:06 PM ?[name]  ?MRN:  [id number] ? ?Principal Problem: Major depressive disorder, recurrent severe without psychotic features (HCC) ?Diagnosis: Principal Problem: ?  Major depressive disorder, recurrent severe without psychotic features (HCC) ?Active Problems: ?  Cluster B personality disorder (HCC) ? ?Patient is a 61 y.o.male who presents to the [facility] unit due to psychosis and SI. ? ? ?Interval History ?Patient was seen today for re-evaluation.  Nursing reports no events overnight. The patient has no issues with performing ADLs.  Patient has been medication compliant.   ? ?Subjective:  On assessment patient reports he is feeling better and that his auditory hallucinations have decreased although still there, denies suicidal thoughts. He continues to complain about poor sleep and constantly-racing thoughts. No homicidal ideations. No physical complaints. No side effects from medications. We discussed increasing the dose of PM antipsychotic.   ? ?Labs: no new results for review. ? ?  ? ?Total Time spent with patient: 30 minutes ? ?Past Psychiatric History: see H&P ? ?Past Medical History:  ?Past Medical History:  ?Diagnosis Date  ? Anxiety   ? Arthritis   ? knees and hands  ? Bipolar disorder (HCC)   ? Depression   ? GERD (gastroesophageal reflux disease)   ? Hepatitis   ? HEP "C"  ? History of kidney stones   ? Hypertension   ? 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 Kidney stones   ? Pericarditis 05/2015  ? a. echo 5/17: EF 60-65%, no RWMA, LV dias fxn nl, LA mildly dilated, RV sys fxn nl, PASP nl, moderate sized circumferential pericardial effusion was identified, 2.12 cm around the LV free wall, <1 cm around the RV free wall. Features were not c/w tamponade physiology  ? PTSD (post-traumatic stress disorder)   ? Witnessed brother's suicide.  ? Restless leg syndrome   ? Syncope   ?  ?Past Surgical History:  ?Procedure Laterality Date  ? CYSTOSCOPY  WITH URETEROSCOPY AND STENT PLACEMENT    ? ESOPHAGOGASTRODUODENOSCOPY N/A 01/11/2016  ? Procedure: ESOPHAGOGASTRODUODENOSCOPY (EGD);  Surgeon: Charlott Rakes, MD;  Location: [facility] ENDOSCOPY;  Service: Endoscopy;  Laterality: N/A;  ? ESOPHAGOGASTRODUODENOSCOPY N/A 04/09/2020  ? Procedure: ESOPHAGOGASTRODUODENOSCOPY (EGD);  Surgeon: [name], MD;  Location: [facility] ENDOSCOPY;  Service: Gastroenterology;  Laterality: N/A;  ? INCISION AND DRAINAGE ABSCESS Left 01/02/2018  ? Procedure: INCISION AND DRAINAGE LEFT KNEE;  Surgeon: Deeann Saint, MD;  Location: [facility] ORS;  Service: Orthopedics;  Laterality: Left;  ? JOINT REPLACEMENT Right   ? TKR  ? KNEE ARTHROSCOPY Right 06/25/2014  ? Procedure: ARTHROSCOPY KNEE;  Surgeon: Deeann Saint, MD;  Location: [facility] ORS;  Service: Orthopedics;  Laterality: Right;  partial arthroscopic medial menisectomy  ? TOTAL KNEE ARTHROPLASTY Right 04/22/2015  ? Procedure: TOTAL KNEE ARTHROPLASTY;  Surgeon: Deeann Saint, MD;  Location: [facility] ORS;  Service: Orthopedics;  Laterality: Right;  ? TOTAL KNEE ARTHROPLASTY Left 10/30/2017  ? Procedure: TOTAL KNEE ARTHROPLASTY;  Surgeon: Deeann Saint, MD;  Location: [facility] ORS;  Service: Orthopedics;  Laterality: Left;  ? TOTAL KNEE REVISION Left 01/02/2018  ? Procedure: poly exchange of tibia and patella left knee;  Surgeon: Deeann Saint, MD;  Location: [facility] ORS;  Service: Orthopedics;  Laterality: Left;  ? ?Family History:  ?Family History  ?Problem Relation Age of Onset  ? CVA Mother   ?     deceased at age 72  ? Depression Brother   ?     Died  by suicide at age 59  ? ?Family Psychiatric  History: see H&P ?Social History:  ?Social History  ? ?Substance and Sexual Activity  ?Alcohol Use Yes  ?   ?Social History  ? ?Substance and Sexual Activity  ?Drug Use Yes  ? Types: Marijuana  ?  ?Social History  ? ?Socioeconomic History  ? Marital status: Single  ?  Spouse name: Not on file  ? Number of children: Not on file  ? Years of education: Not on file  ?  Highest education level: Not on file  ?Occupational History  ? Not on file  ?Tobacco Use  ? Smoking status: Former  ?  Packs/day: 0.75  ?  Years: 20.00  ?  Pack years: 15.00  ?  Types: Cigarettes  ?  Quit date: 05/16/1984  ?  Years since quitting: 37.0  ? Smokeless tobacco: Never  ?Vaping Use  ? Vaping Use: Never used  ?Substance and Sexual Activity  ? Alcohol use: Yes  ? Drug use: Yes  ?  Types: Marijuana  ? Sexual activity: Not on file  ?Other Topics Concern  ? Not on file  ?Social History Narrative  ? Not on file  ? ?Social Determinants of Health  ? ?Financial Resource Strain: Not on file  ?Food Insecurity: Not on file  ?Transportation Needs: Not on file  ?Physical Activity: Not on file  ?Stress: Not on file  ?Social Connections: Not on file  ? ?Additional Social History:  ?  ?  ?  ?  ?  ?  ?  ?  ?  ?  ?  ? ?Sleep: Poor ? ?Appetite:  Good ? ?Current Medications: ?Current Facility-Administered Medications  ?Medication Dose Route Frequency Provider Last Rate Last Admin  ? acetaminophen (TYLENOL) tablet 650 mg  650 mg Oral Q6H PRN Charm RingsLord, Jamison Y, NP   650 mg at 05/08/21 0930  ? alum & mag hydroxide-simeth (MAALOX/MYLANTA) 200-200-20 MG/5ML suspension 30 mL  30 mL Oral Q4H PRN Charm RingsLord, Jamison Y, NP      ? doxepin (SINEQUAN) capsule 100 mg  100 mg Oral QHS Sarina IllHerrick, Richard Edward, DO   100 mg at 05/07/21 2102  ? enalapril (VASOTEC) tablet 10 mg  10 mg Oral Daily Charm RingsLord, Jamison Y, NP   10 mg at 05/08/21 0930  ? feeding supplement (ENSURE ENLIVE / ENSURE PLUS) liquid 237 mL  237 mL Oral BID BM Sarina IllHerrick, Richard Edward, DO   237 mL at 05/08/21 1431  ? FLUoxetine (PROZAC) capsule 60 mg  60 mg Oral Daily Sarina IllHerrick, Richard Edward, DO   60 mg at 05/08/21 0930  ? gabapentin (NEURONTIN) capsule 400 mg  400 mg Oral TID Charm RingsLord, Jamison Y, NP   400 mg at 05/08/21 0930  ? magnesium hydroxide (MILK OF MAGNESIA) suspension 30 mL  30 mL Oral Daily PRN Charm RingsLord, Jamison Y, NP      ? multivitamin with minerals tablet 1 tablet  1 tablet Oral Daily  Charm RingsLord, Jamison Y, NP   1 tablet at 05/08/21 1232  ? OLANZapine (ZYPREXA) tablet 10 mg  10 mg Oral QPM Thalia PartyPaliy, Thais Silberstein, MD      ? Melene Muller[START ON 05/09/2021] OLANZapine (ZYPREXA) tablet 5 mg  5 mg Oral q morning Vernona Peake, MD      ? pantoprazole (PROTONIX) EC tablet 40 mg  40 mg Oral Daily Charm RingsLord, Jamison Y, NP   40 mg at 05/08/21 0930  ? thiamine tablet 100 mg  100 mg Oral Daily Charm RingsLord, Jamison Y, NP  100 mg at 05/08/21 0930  ? traZODone (DESYREL) tablet 25 mg  25 mg Oral Q4H PRN Sarina Ill, DO   25 mg at 05/07/21 2029  ? ? ?Lab Results: No results found for this or any previous visit (from the past 48 hour(s)). ? ?Blood Alcohol level:  ?Lab Results  ?Component Value Date  ? ETH 156 (H) 04/30/2021  ? ETH 121 (H) 01/17/2021  ? ? ?Metabolic Disorder Labs: ?Lab Results  ?Component Value Date  ? HGBA1C 5.3 01/18/2021  ? MPG 105.41 01/18/2021  ? MPG 116.89 01/21/2019  ? ?No results found for: PROLACTIN ?Lab Results  ?Component Value Date  ? CHOL 202 (H) 01/18/2021  ? TRIG 172 (H) 01/18/2021  ? HDL 40 (L) 01/18/2021  ? CHOLHDL 5.1 01/18/2021  ? VLDL 34 01/18/2021  ? LDLCALC 128 (H) 01/18/2021  ? LDLCALC 98 01/07/2016  ? ? ?Physical Findings: ?AIMS: Facial and Oral Movements ?Muscles of Facial Expression: None, normal ?Lips and Perioral Area: None, normal ?Jaw: None, normal ?Tongue: None, normal,Extremity Movements ?Upper (arms, wrists, hands, fingers): None, normal ?Lower (legs, knees, ankles, toes): None, normal, Trunk Movements ?Neck, shoulders, hips: None, normal, Overall Severity ?Severity of abnormal movements (highest score from questions above): None, normal ?Incapacitation due to abnormal movements: None, normal ?Patient's awareness of abnormal movements (rate only patient's report): No Awareness, Dental Status ?Current problems with teeth and/or dentures?: No ?Does patient usually wear dentures?: No  ?CIWA:  CIWA-Ar Total: 0 ?COWS:    ? ?Musculoskeletal: ?Strength & Muscle Tone: within normal limits ?Gait &  Station: normal ?Patient leans: N/A ? ?Psychiatric Specialty Exam: ? ?Presentation  ?General Appearance: Disheveled; Casual ? ?Eye Contact:Fair ? ?Speech:Clear and Coherent ? ?Speech Volume:Normal ? ?Handedn
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
## 2021-05-08 NOTE — Group Note (Signed)
LCSW Group Therapy Note ? ?Group Date: 05/08/2021 ?Start Time: 1300 ?End Time: 1400 ? ? ?Type of Therapy and Topic:  Group Therapy - How To Cope with Nervousness about Discharge  ? ?Participation Level:  Did Not Attend  ? ?Description of Group ?This process group involved identification of patients' feelings about discharge. Some of them are scheduled to be discharged soon, while others are new admissions, but each of them was asked to share thoughts and feelings surrounding discharge from the hospital. One common theme was that they are excited at the prospect of going home, while another was that many of them are apprehensive about sharing why they were hospitalized. Patients were given the opportunity to discuss these feelings with their peers in preparation for discharge. ? ?Therapeutic Goals ? ?Patient will identify their overall feelings about pending discharge. ?Patient will think about how they might proactively address issues that they believe will once again arise once they get home (i.e. with parents). ?Patients will participate in discussion about having hope for change. ? ? ?Summary of Patient Progress:   ?Group was not held due to staffing shortages; group will resume once staffing is adequate.  ? ?Therapeutic Modalities ?Cognitive Behavioral Therapy ? ? ?Gael Delude W Aydyn Testerman, LCSWA ?05/08/2021  3:12 PM   ?

## 2021-05-08 NOTE — BH IP Treatment Plan (Signed)
Interdisciplinary Treatment and Diagnostic Plan Update ? ?05/08/2021 ?Time of Session: 7017 ?[place] ?MRN: [id number] ? ?Principal Diagnosis: Major depressive disorder, recurrent severe without psychotic features ([place]) ? ?Secondary Diagnoses: Principal Problem: ?  Major depressive disorder, recurrent severe without psychotic features ([place]) ?Active Problems: ?  Cluster B personality disorder ([place]) ? ? ?Current Medications:  ?Current Facility-Administered Medications  ?Medication Dose Route Frequency Provider Last Rate Last Admin  ? acetaminophen (TYLENOL) tablet 650 mg  650 mg Oral Q6H PRN Patrecia Pour, NP   650 mg at 05/08/21 0930  ? alum & mag hydroxide-simeth (MAALOX/MYLANTA) 200-200-20 MG/5ML suspension 30 mL  30 mL Oral Q4H PRN Patrecia Pour, NP      ? doxepin (SINEQUAN) capsule 100 mg  100 mg Oral QHS Parks Ranger, DO   100 mg at 05/07/21 2102  ? enalapril (VASOTEC) tablet 10 mg  10 mg Oral Daily Patrecia Pour, NP   10 mg at 05/08/21 0930  ? feeding supplement (ENSURE ENLIVE / ENSURE PLUS) liquid 237 mL  237 mL Oral BID BM Parks Ranger, DO   237 mL at 05/08/21 0935  ? FLUoxetine (PROZAC) capsule 60 mg  60 mg Oral Daily Parks Ranger, DO   60 mg at 05/08/21 0930  ? gabapentin (NEURONTIN) capsule 400 mg  400 mg Oral TID Patrecia Pour, NP   400 mg at 05/08/21 0930  ? magnesium hydroxide (MILK OF MAGNESIA) suspension 30 mL  30 mL Oral Daily PRN Patrecia Pour, NP      ? multivitamin with minerals tablet 1 tablet  1 tablet Oral Daily Patrecia Pour, NP   1 tablet at 05/07/21 2330  ? OLANZapine (ZYPREXA) tablet 5 mg  5 mg Oral [id number] Parks Ranger, DO   5 mg at 05/08/21 0930  ? pantoprazole (PROTONIX) EC tablet 40 mg  40 mg Oral Daily Patrecia Pour, NP   40 mg at 05/08/21 0930  ? thiamine tablet 100 mg  100 mg Oral Daily Patrecia Pour, NP   100 mg at 05/08/21 0930  ? traZODone (DESYREL) tablet 25 mg  25 mg Oral Q4H PRN Parks Ranger, DO   25  mg at 05/07/21 2029  ? ?PTA Medications: ?Medications Prior to Admission  ?Medication Sig Dispense Refill Last Dose  ? aspirin 325 MG EC tablet Take 1 tablet (325 mg total) by mouth daily. (Patient not taking: Reported on 04/30/2021) 30 tablet 1   ? docusate sodium (COLACE) 100 MG capsule Take 1 capsule (100 mg total) by mouth 2 (two) times daily. (Patient not taking: Reported on 04/30/2021) 60 capsule 1   ? enalapril (VASOTEC) 10 MG tablet Take 1 tablet (10 mg total) by mouth daily. 10 tablet 0   ? FLUoxetine (PROZAC) 40 MG capsule Take 2 capsules (80 mg total) by mouth daily. 60 capsule 1   ? gabapentin (NEURONTIN) 400 MG capsule Take 1 capsule (400 mg total) by mouth 3 (three) times daily. 90 capsule 1   ? lithium carbonate (LITHOBID) 300 MG CR tablet Take 1 tablet (300 mg total) by mouth every 12 (twelve) hours. 60 tablet 1   ? naltrexone (DEPADE) 50 MG tablet Take 1 tablet (50 mg total) by mouth daily. (Patient not taking: Reported on 04/30/2021) 30 tablet 1   ? pantoprazole (PROTONIX) 40 MG tablet Take 1 tablet (40 mg total) by mouth daily. 30 tablet 1   ? QUEtiapine (SEROQUEL XR) 400 MG 24 hr tablet  Take 400 mg by mouth at bedtime.     ? QUEtiapine (SEROQUEL) 25 MG tablet Take 25 mg by mouth 2 (two) times daily as needed.     ? topiramate (TOPAMAX) 25 MG tablet Take 1 tablet (25 mg total) by mouth daily. (Patient not taking: Reported on 04/30/2021) 30 tablet 0   ? ? ?Patient Stressors: Financial difficulties   ?Legal issue   ?Loss of significant relationship of 30 years (girlfriend)   ?Medication change or noncompliance   ?Substance abuse   ? ?Patient Strengths: Capable of independent living  ?Communication skills  ?Financial means  ? ?Treatment Modalities: Medication Management, Group therapy, Case management,  ?1 to 1 session with clinician, Psychoeducation, Recreational therapy. ? ? ?Physician Treatment Plan for Primary Diagnosis: Major depressive disorder, recurrent severe without psychotic features  ([place]) ?Long Term Goal(s): Improvement in symptoms so as ready for discharge  ? ?Short Term Goals: Ability to identify changes in lifestyle to reduce recurrence of condition will improve ?Ability to verbalize feelings will improve ?Ability to disclose and discuss suicidal ideas ?Ability to demonstrate self-control will improve ?Ability to identify and develop effective coping behaviors will improve ?Ability to maintain clinical measurements within normal limits will improve ?Compliance with prescribed medications will improve ?Ability to identify triggers associated with substance abuse/mental health issues will improve ? ?Medication Management: Evaluate patient's response, side effects, and tolerance of medication regimen. ? ?Therapeutic Interventions: 1 to 1 sessions, Unit Group sessions and Medication administration. ? ?Evaluation of Outcomes: Not Met ? ?Physician Treatment Plan for Secondary Diagnosis: Principal Problem: ?  Major depressive disorder, recurrent severe without psychotic features ([place]) ?Active Problems: ?  Cluster B personality disorder ([place]) ? ?Long Term Goal(s): Improvement in symptoms so as ready for discharge  ? ?Short Term Goals: Ability to identify changes in lifestyle to reduce recurrence of condition will improve ?Ability to verbalize feelings will improve ?Ability to disclose and discuss suicidal ideas ?Ability to demonstrate self-control will improve ?Ability to identify and develop effective coping behaviors will improve ?Ability to maintain clinical measurements within normal limits will improve ?Compliance with prescribed medications will improve ?Ability to identify triggers associated with substance abuse/mental health issues will improve    ? ?Medication Management: Evaluate patient's response, side effects, and tolerance of medication regimen. ? ?Therapeutic Interventions: 1 to 1 sessions, Unit Group sessions and Medication administration. ? ?Evaluation of Outcomes: Not Met ? ? ?RN  Treatment Plan for Primary Diagnosis: Major depressive disorder, recurrent severe without psychotic features ([place]) ?Long Term Goal(s): Knowledge of disease and therapeutic regimen to maintain health will improve ? ?Short Term Goals: Ability to remain free from injury will improve, Ability to verbalize frustration and anger appropriately will improve, Ability to demonstrate self-control, Ability to participate in decision making will improve, Ability to verbalize feelings will improve, Ability to disclose and discuss suicidal ideas, Ability to identify and develop effective coping behaviors will improve, and Compliance with prescribed medications will improve ? ?Medication Management: RN will administer medications as ordered by provider, will assess and evaluate patient's response and provide education to patient for prescribed medication. RN will report any adverse and/or side effects to prescribing provider. ? ?Therapeutic Interventions: 1 on 1 counseling sessions, Psychoeducation, Medication administration, Evaluate responses to treatment, Monitor vital signs and CBGs as ordered, Perform/monitor CIWA, COWS, AIMS and Fall Risk screenings as ordered, Perform wound care treatments as ordered. ? ?Evaluation of Outcomes: Not Met ? ? ?LCSW Treatment Plan for Primary Diagnosis: Major depressive disorder, recurrent severe without psychotic  features ([place]) ?Long Term Goal(s): Safe transition to appropriate next level of care at discharge, Engage patient in therapeutic group addressing interpersonal concerns. ? ?Short Term Goals: Engage patient in aftercare planning with referrals and resources, Increase social support, Increase ability to appropriately verbalize feelings, Increase emotional regulation, Facilitate acceptance of mental health diagnosis and concerns, Facilitate patient progression through stages of change regarding substance use diagnoses and concerns, Identify triggers associated with mental health/substance  abuse issues, and Increase skills for wellness and recovery ? ?Therapeutic Interventions: Assess for all discharge needs, 1 to 1 time with Education officer, museum, Explore available resources and support systems, [place]

## 2021-05-08 NOTE — Plan of Care (Signed)
Patient has stayed in the milieu, pleasant and cooperative. Denying S/I/HI. Getting along with peers and participating in activities. No sign of distress.  ?

## 2021-05-09 DIAGNOSIS — F332 Major depressive disorder, recurrent severe without psychotic features: Secondary | ICD-10-CM | POA: Diagnosis not present

## 2021-05-09 NOTE — Progress Notes (Signed)
[facility] MD Progress Note ? ?05/09/2021 12:57 PM ?[place]  ?MRN:  [id number] ? ?Principal Problem: Major depressive disorder, recurrent severe without psychotic features ([place]) ?Diagnosis: Principal Problem: ?  Major depressive disorder, recurrent severe without psychotic features ([place]) ?Active Problems: ?  Cluster B personality disorder ([place]) ? ?Patient is a 61 y.o.male who presents to the [facility] unit due to psychosis and SI. ? ? ?Interval History ?Patient was seen today for re-evaluation.  Nursing reports no events overnight. The patient has no issues with performing ADLs.  Patient has been medication compliant.   ? ?Subjective:  On assessment patient reports he is feeling better overall, better sleep, less racing thoughts, less auditory hallucinations, denies suicidal or homicidal thoughts. No physical complaints. No side effects from medications.  ? ?Labs: no new results for review. ? ?  ? ?Total Time spent with patient: 30 minutes ? ?Past Psychiatric History: see H&P ? ?Past Medical History:  ?Past Medical History:  ?Diagnosis Date  ? Anxiety   ? Arthritis   ? knees and hands  ? Bipolar disorder ([place])   ? Depression   ? GERD (gastroesophageal reflux disease)   ? Hepatitis   ? HEP "C"  ? History of kidney stones   ? Hypertension   ? 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 Kidney stones   ? Pericarditis 05/2015  ? a. echo 5/17: EF 60-65%, no RWMA, LV dias fxn nl, LA mildly dilated, RV sys fxn nl, PASP nl, moderate sized circumferential pericardial effusion was identified, 2.12 cm around the LV free wall, <1 cm around the RV free wall. Features were not c/w tamponade physiology  ? PTSD (post-traumatic stress disorder)   ? Witnessed brother's suicide.  ? Restless leg syndrome   ? Syncope   ?  ?Past Surgical History:  ?Procedure Laterality Date  ? CYSTOSCOPY WITH URETEROSCOPY AND STENT PLACEMENT    ? ESOPHAGOGASTRODUODENOSCOPY N/A 01/11/2016  ? Procedure: ESOPHAGOGASTRODUODENOSCOPY (EGD);   Surgeon: Wilford Corner, MD;  Location: [facility] ENDOSCOPY;  Service: Endoscopy;  Laterality: N/A;  ? ESOPHAGOGASTRODUODENOSCOPY N/A 04/09/2020  ? Procedure: ESOPHAGOGASTRODUODENOSCOPY (EGD);  Surgeon: Jonathon Bellows, MD;  Location: [facility] ENDOSCOPY;  Service: Gastroenterology;  Laterality: N/A;  ? INCISION AND DRAINAGE ABSCESS Left 01/02/2018  ? Procedure: INCISION AND DRAINAGE LEFT KNEE;  Surgeon: Earnestine Leys, MD;  Location: [facility] ORS;  Service: Orthopedics;  Laterality: Left;  ? JOINT REPLACEMENT Right   ? TKR  ? KNEE ARTHROSCOPY Right 06/25/2014  ? Procedure: ARTHROSCOPY KNEE;  Surgeon: Earnestine Leys, MD;  Location: [facility] ORS;  Service: Orthopedics;  Laterality: Right;  partial arthroscopic medial menisectomy  ? TOTAL KNEE ARTHROPLASTY Right 04/22/2015  ? Procedure: TOTAL KNEE ARTHROPLASTY;  Surgeon: Earnestine Leys, MD;  Location: [facility] ORS;  Service: Orthopedics;  Laterality: Right;  ? TOTAL KNEE ARTHROPLASTY Left 10/30/2017  ? Procedure: TOTAL KNEE ARTHROPLASTY;  Surgeon: Earnestine Leys, MD;  Location: [facility] ORS;  Service: Orthopedics;  Laterality: Left;  ? TOTAL KNEE REVISION Left 01/02/2018  ? Procedure: poly exchange of tibia and patella left knee;  Surgeon: Earnestine Leys, MD;  Location: [facility] ORS;  Service: Orthopedics;  Laterality: Left;  ? ?Family History:  ?Family History  ?Problem Relation Age of Onset  ? CVA Mother   ?     deceased at age 86  ? Depression Brother   ?     Died by suicide at age 26  ? ?Family Psychiatric  History: see H&P ?Social History:  ?Social History  ? ?Substance  and Sexual Activity  ?Alcohol Use Yes  ?   ?Social History  ? ?Substance and Sexual Activity  ?Drug Use Yes  ? Types: Marijuana  ?  ?Social History  ? ?Socioeconomic History  ? Marital status: Single  ?  Spouse name: Not on file  ? Number of children: Not on file  ? Years of education: Not on file  ? Highest education level: Not on file  ?Occupational History  ? Not on file  ?Tobacco Use  ? Smoking status: Former  ?  Packs/day: 0.75   ?  Years: 20.00  ?  Pack years: 15.00  ?  Types: Cigarettes  ?  Quit date: 05/16/1984  ?  Years since quitting: 37.0  ? Smokeless tobacco: Never  ?Vaping Use  ? Vaping Use: Never used  ?Substance and Sexual Activity  ? Alcohol use: Yes  ? Drug use: Yes  ?  Types: Marijuana  ? Sexual activity: Not on file  ?Other Topics Concern  ? Not on file  ?Social History Narrative  ? Not on file  ? ?Social Determinants of Health  ? ?Financial Resource Strain: Not on file  ?Food Insecurity: Not on file  ?Transportation Needs: Not on file  ?Physical Activity: Not on file  ?Stress: Not on file  ?Social Connections: Not on file  ? ?Additional Social History:  ?  ?  ?  ?  ?  ?  ?  ?  ?  ?  ?  ? ?Sleep: Poor ? ?Appetite:  Good ? ?Current Medications: ?Current Facility-Administered Medications  ?Medication Dose Route Frequency Provider Last Rate Last Admin  ? acetaminophen (TYLENOL) tablet 650 mg  650 mg Oral Q6H PRN Patrecia Pour, NP   650 mg at 05/09/21 1120  ? alum & mag hydroxide-simeth (MAALOX/MYLANTA) 200-200-20 MG/5ML suspension 30 mL  30 mL Oral Q4H PRN Patrecia Pour, NP      ? doxepin (SINEQUAN) capsule 100 mg  100 mg Oral QHS Parks Ranger, DO   100 mg at 05/08/21 2157  ? enalapril (VASOTEC) tablet 10 mg  10 mg Oral Daily Patrecia Pour, NP   10 mg at 05/09/21 [id number]  ? feeding supplement (ENSURE ENLIVE / ENSURE PLUS) liquid 237 mL  237 mL Oral BID BM Parks Ranger, DO   237 mL at 05/09/21 [id number]  ? FLUoxetine (PROZAC) capsule 60 mg  60 mg Oral Daily Parks Ranger, DO   60 mg at 05/09/21 [id number]  ? gabapentin (NEURONTIN) capsule 400 mg  400 mg Oral TID Patrecia Pour, NP   400 mg at 05/09/21 [id number]  ? magnesium hydroxide (MILK OF MAGNESIA) suspension 30 mL  30 mL Oral Daily PRN Patrecia Pour, NP      ? multivitamin with minerals tablet 1 tablet  1 tablet Oral Daily Patrecia Pour, NP   1 tablet at 05/09/21 [phone]  ? OLANZapine (ZYPREXA) tablet 10 mg  10 mg Oral QPM Larita Fife, MD   10 mg at  05/08/21 1748  ? OLANZapine (ZYPREXA) tablet 5 mg  5 mg Oral q morning Larita Fife, MD   5 mg at 05/09/21 0841  ? pantoprazole (PROTONIX) EC tablet 40 mg  40 mg Oral Daily Patrecia Pour, NP   40 mg at 05/09/21 [id number]  ? thiamine tablet 100 mg  100 mg Oral Daily Patrecia Pour, NP   100 mg at 05/09/21 [id number]  ? traZODone (DESYREL) tablet 25 mg  25 mg Oral Q4H  PRN Parks Ranger, DO   25 mg at 05/09/21 1120  ? ? ?Lab Results: No results found for this or any previous visit (from the past 48 hour(s)). ? ?Blood Alcohol level:  ?Lab Results  ?Component Value Date  ? ETH 156 (H) 04/30/2021  ? ETH 121 (H) 01/17/2021  ? ? ?Metabolic Disorder Labs: ?Lab Results  ?Component Value Date  ? HGBA1C 5.3 01/18/2021  ? MPG 105.41 01/18/2021  ? MPG 116.89 01/21/2019  ? ?No results found for: PROLACTIN ?Lab Results  ?Component Value Date  ? CHOL 202 (H) 01/18/2021  ? TRIG 172 (H) 01/18/2021  ? HDL 40 (L) 01/18/2021  ? CHOLHDL 5.1 01/18/2021  ? VLDL 34 01/18/2021  ? LDLCALC 128 (H) 01/18/2021  ? [employer] 98 01/07/2016  ? ? ?Physical Findings: ?AIMS: Facial and Oral Movements ?Muscles of Facial Expression: None, normal ?Lips and Perioral Area: None, normal ?Jaw: None, normal ?Tongue: None, normal,Extremity Movements ?Upper (arms, wrists, hands, fingers): None, normal ?Lower (legs, knees, ankles, toes): None, normal, Trunk Movements ?Neck, shoulders, hips: None, normal, Overall Severity ?Severity of abnormal movements (highest score from questions above): None, normal ?Incapacitation due to abnormal movements: None, normal ?Patient's awareness of abnormal movements (rate only patient's report): No Awareness, Dental Status ?Current problems with teeth and/or dentures?: No ?Does patient usually wear dentures?: No  ?CIWA:  CIWA-Ar Total: 0 ?COWS:    ? ?Musculoskeletal: ?Strength & Muscle Tone: within normal limits ?Gait & Station: normal ?Patient leans: N/A ? ?Psychiatric Specialty Exam: ? ?Presentation  ?General Appearance:  Disheveled; Casual ? ?Eye Contact:Fair ? ?Speech:Clear and Coherent ? ?Speech Volume:Normal ? ?Handedness:Right ? ? ?Mood and Affect  ?Mood:Anxious; Depressed; Hopeless ? ?Affect:Appropriate; Congruent; Depressed ? ? ?Terie Purser

## 2021-05-09 NOTE — Progress Notes (Addendum)
Pt standing at nurses' station; calm, cooperative. Pt states that he feels "better; overall, I feel better." He c/o pain in his R knuckle, which he rates 5/10. He currently denies SI/HI and visual hallucinations; however, he endorses auditory hallucinations and hears voices "telling me to hurt myself". He denies that voices tell him to hurt other people. He reports "I didn't sleep last night; maybe an hour or two" and says that he had "vivid dreams". He describes his appetite as "too good". He expresses feelings of anxiety due to a verbal altercation earlier today with another patient and depression due to "what I got to go through in the next month"; he rates both anxiety and depression 10/10. No acute distress noted. ?

## 2021-05-09 NOTE — Group Note (Signed)
LCSW Group Therapy Note ? ?Group Date: 05/09/2021 ?Start Time: 1330 ?End Time: 1430 ? ? ?Type of Therapy and Topic:  Group Therapy - Healthy vs Unhealthy Coping Skills ? ?Participation Level:  Active  ? ?Description of Group ?The focus of this group was to determine what unhealthy coping techniques typically are used by group members and what healthy coping techniques would be helpful in coping with various problems. Patients were guided in becoming aware of the differences between healthy and unhealthy coping techniques. Patients were asked to identify 2-3 healthy coping skills they would like to learn to use more effectively. ? ?Therapeutic Goals ?Patients learned that coping is what human beings do all day long to deal with various situations in their lives ?Patients defined and discussed healthy vs unhealthy coping techniques ?Patients identified their preferred coping techniques and identified whether these were healthy or unhealthy ?Patients determined 2-3 healthy coping skills they would like to become more familiar with and use more often. ?Patients provided support and ideas to each other ? ? ?Summary of Patient Progress: Patient was present for the duration of group and actively participated in the topic of discussion. Patient identified drinking alcohol and "putting my hand through glass" when he was in a blackout from drinking as unhealthy coping skills. Patient shared that he has had a difficult time managing "stress" recently, stating "it has been hard to deal with and face difficulties in life." Patient  identified talking to others as a healthy coping skill.  ? ?Therapeutic Modalities ?Cognitive Behavioral Therapy ?Motivational Interviewing ? ?Ileana Ladd Luciann Gossett, LCSWA ?05/09/2021  4:25 PM   ?

## 2021-05-09 NOTE — Plan of Care (Signed)
Patient is alert and oriented to person, time, place and situation. Endorsed anxiety of 10/10. Patient stated "I'm anxious about everything that is going in my life." Endorsed SI Patient stated " It's a constant thought that's running through my mind." Endorsed AVH stating "I'm hearing voices telling me to hurt myself, and seeing things going by, I know there's nothing there but I see the shadow going by." Reports sleeping good last night. ? ? ? ?Problem: Education: ?Goal: Knowledge of [place] [facility] Education information/materials will improve ?Outcome: Progressing ?Goal: Emotional status will improve ?Outcome: Progressing ?Goal: Mental status will improve ?Outcome: Progressing ?Goal: Verbalization of understanding the information provided will improve ?Outcome: Progressing ?  ?Problem: Activity: ?Goal: Interest or engagement in activities will improve ?Outcome: Progressing ?Goal: Sleeping patterns will improve ?Outcome: Progressing ?  ?Problem: Coping: ?Goal: Ability to verbalize frustrations and anger appropriately will improve ?Outcome: Progressing ?Goal: Ability to demonstrate self-control will improve ?Outcome: Progressing ?  ?Problem: Health Behavior/Discharge Planning: ?Goal: Identification of resources available to assist in meeting health care needs will improve ?Outcome: Progressing ?Goal: Compliance with treatment plan for underlying cause of condition will improve ?Outcome: Progressing ?  ?Problem: Physical Regulation: ?Goal: Ability to maintain clinical measurements within normal limits will improve ?Outcome: Progressing ?  ?Problem: Education: ?Goal: Utilization of techniques to improve thought processes will improve ?Outcome: Progressing ?Goal: Knowledge of the prescribed therapeutic regimen will improve ?Outcome: Progressing ?  ?Problem: Safety: ?Goal: Periods of time without injury will increase ?Outcome: Progressing ?  ?Problem: Activity: ?Goal: Interest or engagement in leisure activities  will improve ?Outcome: Progressing ?Goal: Imbalance in normal sleep/wake cycle will improve ?Outcome: Progressing ?  ?

## 2021-05-10 DIAGNOSIS — F332 Major depressive disorder, recurrent severe without psychotic features: Secondary | ICD-10-CM | POA: Diagnosis not present

## 2021-05-10 NOTE — Progress Notes (Signed)
Patient denies SI, HI & AVH. He endorses some anxiety, but he was medication compliant on shift. He was visible in the milieu interacting well with peers and staff. He seemed to rest well through out the night. No new behavioral issues to report on shift or at this time.  ?

## 2021-05-10 NOTE — Progress Notes (Signed)
[facility] MD Progress Note ? ?05/10/2021 1:31 PM ?[place]  ?MRN:  [id number] ?Subjective: [name] tells me that he is doing much better.  He likes the medication combination.  He states that his suicidal thoughts and his auditory hallucinations have decreased.  He is more alert and pleasant.  He denies any side effects from his medications. ? ?Principal Problem: Major depressive disorder, recurrent severe without psychotic features ([place]) ?Diagnosis: Principal Problem: ?  Major depressive disorder, recurrent severe without psychotic features ([place]) ?Active Problems: ?  Cluster B personality disorder ([place]) ? ?Total Time spent with patient: 15 minutes ? ?Past Psychiatric History: History of alcohol abuse and chronic depression and mood instability.  He has been to [facility] in the past and outpatient at [facility].  He has had past diagnosis of major depressive disorder with and without psychotic features and bipolar disorder.  Obviously he has a history of cluster B personality disorder. ? ?Past Medical History:  ?Past Medical History:  ?Diagnosis Date  ? Anxiety   ? Arthritis   ? knees and hands  ? Bipolar disorder ([place])   ? Depression   ? GERD (gastroesophageal reflux disease)   ? Hepatitis   ? HEP "C"  ? History of kidney stones   ? Hypertension   ? 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 Kidney stones   ? Pericarditis 05/2015  ? a. echo 5/17: EF 60-65%, no RWMA, LV dias fxn nl, LA mildly dilated, RV sys fxn nl, PASP nl, moderate sized circumferential pericardial effusion was identified, 2.12 cm around the LV free wall, <1 cm around the RV free wall. Features were not c/w tamponade physiology  ? PTSD (post-traumatic stress disorder)   ? Witnessed brother's suicide.  ? Restless leg syndrome   ? Syncope   ?  ?Past Surgical History:  ?Procedure Laterality Date  ? CYSTOSCOPY WITH URETEROSCOPY AND STENT PLACEMENT    ? ESOPHAGOGASTRODUODENOSCOPY N/A 01/11/2016  ? Procedure: ESOPHAGOGASTRODUODENOSCOPY  (EGD);  Surgeon: Wilford Corner, MD;  Location: [facility] ENDOSCOPY;  Service: Endoscopy;  Laterality: N/A;  ? ESOPHAGOGASTRODUODENOSCOPY N/A 04/09/2020  ? Procedure: ESOPHAGOGASTRODUODENOSCOPY (EGD);  Surgeon: Jonathon Bellows, MD;  Location: [facility] ENDOSCOPY;  Service: Gastroenterology;  Laterality: N/A;  ? INCISION AND DRAINAGE ABSCESS Left 01/02/2018  ? Procedure: INCISION AND DRAINAGE LEFT KNEE;  Surgeon: Earnestine Leys, MD;  Location: [facility] ORS;  Service: Orthopedics;  Laterality: Left;  ? JOINT REPLACEMENT Right   ? TKR  ? KNEE ARTHROSCOPY Right 06/25/2014  ? Procedure: ARTHROSCOPY KNEE;  Surgeon: Earnestine Leys, MD;  Location: [facility] ORS;  Service: Orthopedics;  Laterality: Right;  partial arthroscopic medial menisectomy  ? TOTAL KNEE ARTHROPLASTY Right 04/22/2015  ? Procedure: TOTAL KNEE ARTHROPLASTY;  Surgeon: Earnestine Leys, MD;  Location: [facility] ORS;  Service: Orthopedics;  Laterality: Right;  ? TOTAL KNEE ARTHROPLASTY Left 10/30/2017  ? Procedure: TOTAL KNEE ARTHROPLASTY;  Surgeon: Earnestine Leys, MD;  Location: [facility] ORS;  Service: Orthopedics;  Laterality: Left;  ? TOTAL KNEE REVISION Left 01/02/2018  ? Procedure: poly exchange of tibia and patella left knee;  Surgeon: Earnestine Leys, MD;  Location: [facility] ORS;  Service: Orthopedics;  Laterality: Left;  ? ?Family History:  ?Family History  ?Problem Relation Age of Onset  ? CVA Mother   ?     deceased at age 23  ? Depression Brother   ?     Died by suicide at age 12  ? ? ?Social History:  ?Social History  ? ?Substance and Sexual  Activity  ?Alcohol Use Yes  ?   ?Social History  ? ?Substance and Sexual Activity  ?Drug Use Yes  ? Types: Marijuana  ?  ?Social History  ? ?Socioeconomic History  ? Marital status: Single  ?  Spouse name: Not on file  ? Number of children: Not on file  ? Years of education: Not on file  ? Highest education level: Not on file  ?Occupational History  ? Not on file  ?Tobacco Use  ? Smoking status: Former  ?  Packs/day: 0.75  ?  Years: 20.00  ?  Pack  years: 15.00  ?  Types: Cigarettes  ?  Quit date: 05/16/1984  ?  Years since quitting: 37.0  ? Smokeless tobacco: Never  ?Vaping Use  ? Vaping Use: Never used  ?Substance and Sexual Activity  ? Alcohol use: Yes  ? Drug use: Yes  ?  Types: Marijuana  ? Sexual activity: Not on file  ?Other Topics Concern  ? Not on file  ?Social History Narrative  ? Not on file  ? ?Social Determinants of Health  ? ?Financial Resource Strain: Not on file  ?Food Insecurity: Not on file  ?Transportation Needs: Not on file  ?Physical Activity: Not on file  ?Stress: Not on file  ?Social Connections: Not on file  ? ?Additional Social History:  ?  ?  ?  ?  ?  ?  ?  ?  ?  ?  ?  ? ?Sleep: Good ? ?Appetite:  Good ? ?Current Medications: ?Current Facility-Administered Medications  ?Medication Dose Route Frequency Provider Last Rate Last Admin  ? acetaminophen (TYLENOL) tablet 650 mg  650 mg Oral Q6H PRN Patrecia Pour, NP   650 mg at 05/10/21 0945  ? alum & mag hydroxide-simeth (MAALOX/MYLANTA) 200-200-20 MG/5ML suspension 30 mL  30 mL Oral Q4H PRN Patrecia Pour, NP      ? doxepin (SINEQUAN) capsule 100 mg  100 mg Oral QHS Parks Ranger, DO   100 mg at 05/09/21 2127  ? enalapril (VASOTEC) tablet 10 mg  10 mg Oral Daily Patrecia Pour, NP   10 mg at 05/10/21 0944  ? feeding supplement (ENSURE ENLIVE / ENSURE PLUS) liquid 237 mL  237 mL Oral BID BM Parks Ranger, DO   237 mL at 05/10/21 0944  ? FLUoxetine (PROZAC) capsule 60 mg  60 mg Oral Daily Parks Ranger, DO   60 mg at 05/10/21 0945  ? gabapentin (NEURONTIN) capsule 400 mg  400 mg Oral TID Patrecia Pour, NP   400 mg at 05/10/21 [id number]  ? magnesium hydroxide (MILK OF MAGNESIA) suspension 30 mL  30 mL Oral Daily PRN Patrecia Pour, NP      ? multivitamin with minerals tablet 1 tablet  1 tablet Oral Daily Patrecia Pour, NP   1 tablet at 05/10/21 0945  ? OLANZapine (ZYPREXA) tablet 10 mg  10 mg Oral QPM Larita Fife, MD   10 mg at 05/09/21 1734  ? OLANZapine  (ZYPREXA) tablet 5 mg  5 mg Oral q morning Larita Fife, MD   5 mg at 05/10/21 0944  ? pantoprazole (PROTONIX) EC tablet 40 mg  40 mg Oral Daily Patrecia Pour, NP   40 mg at 05/10/21 0945  ? thiamine tablet 100 mg  100 mg Oral Daily Patrecia Pour, NP   100 mg at 05/10/21 [id number]  ? traZODone (DESYREL) tablet 25 mg  25 mg Oral Q4H PRN Louis Meckel,  Nunzio Cory, DO   25 mg at 05/10/21 0945  ? ? ?Lab Results: No results found for this or any previous visit (from the past 48 hour(s)). ? ?Blood Alcohol level:  ?Lab Results  ?Component Value Date  ? ETH 156 (H) 04/30/2021  ? ETH 121 (H) 01/17/2021  ? ? ?Metabolic Disorder Labs: ?Lab Results  ?Component Value Date  ? HGBA1C 5.3 01/18/2021  ? MPG 105.41 01/18/2021  ? MPG 116.89 01/21/2019  ? ?No results found for: PROLACTIN ?Lab Results  ?Component Value Date  ? CHOL 202 (H) 01/18/2021  ? TRIG 172 (H) 01/18/2021  ? HDL 40 (L) 01/18/2021  ? CHOLHDL 5.1 01/18/2021  ? VLDL 34 01/18/2021  ? LDLCALC 128 (H) 01/18/2021  ? [place] 98 01/07/2016  ? ? ?Physical Findings: ?AIMS: Facial and Oral Movements ?Muscles of Facial Expression: None, normal ?Lips and Perioral Area: None, normal ?Jaw: None, normal ?Tongue: None, normal,Extremity Movements ?Upper (arms, wrists, hands, fingers): None, normal ?Lower (legs, knees, ankles, toes): None, normal, Trunk Movements ?Neck, shoulders, hips: None, normal, Overall Severity ?Severity of abnormal movements (highest score from questions above): None, normal ?Incapacitation due to abnormal movements: None, normal ?Patient's awareness of abnormal movements (rate only patient's report): No Awareness, Dental Status ?Current problems with teeth and/or dentures?: No ?Does patient usually wear dentures?: No  ?CIWA:  CIWA-Ar Total: 0 ?COWS:    ? ?Musculoskeletal: ?Strength & Muscle Tone: within normal limits ?Gait & Station: normal ?Patient leans: N/A ? ?Psychiatric Specialty Exam: ? ?Presentation  ?General Appearance: Disheveled; Casual ? ?Eye  Contact:Fair ? ?Speech:Clear and Coherent ? ?Speech Volume:Normal ? ?Handedness:Right ? ? ?Mood and Affect  ?Mood:Anxious; Depressed; Hopeless ? ?Affect:Appropriate; Congruent; Depressed ? ? ?Thought Process  ?Though

## 2021-05-10 NOTE — Plan of Care (Signed)
Patient is alert and oriented to person, time, place and situation. Endorsed anxiety of 10/10. Patient stated "I'm anxious about everything that is going in my life." Endorsed SI Patient stated " It's a constant thought that's running through my mind." Endorsed AVH stating "I'm hearing voices telling me to hurt myself, and seeing things going by, I know there's nothing there but I see the shadow going by." Reports sleeping good last night. ? ? ? ? ?Problem: Education: ?Goal: Knowledge of [place] [facility] Education information/materials will improve ?Outcome: Progressing ?Goal: Emotional status will improve ?Outcome: Progressing ?Goal: Mental status will improve ?Outcome: Progressing ?Goal: Verbalization of understanding the information provided will improve ?Outcome: Progressing ?  ?Problem: Activity: ?Goal: Interest or engagement in activities will improve ?Outcome: Progressing ?Goal: Sleeping patterns will improve ?Outcome: Progressing ?  ?Problem: Coping: ?Goal: Ability to verbalize frustrations and anger appropriately will improve ?Outcome: Progressing ?Goal: Ability to demonstrate self-control will improve ?Outcome: Progressing ?  ?Problem: Activity: ?Goal: Interest or engagement in leisure activities will improve ?Outcome: Progressing ?Goal: Imbalance in normal sleep/wake cycle will improve ?Outcome: Progressing ?  ?Problem: Coping: ?Goal: Coping ability will improve ?Outcome: Progressing ?Goal: Will verbalize feelings ?Outcome: Progressing ?  ?Problem: Health Behavior/Discharge Planning: ?Goal: Ability to make decisions will improve ?Outcome: Progressing ?Goal: Compliance with therapeutic regimen will improve ?Outcome: Progressing ?  ?Problem: Health Behavior/Discharge Planning: ?Goal: Ability to identify changes in lifestyle to reduce recurrence of condition will improve ?Outcome: Progressing ?Goal: Identification of resources available to assist in meeting health care needs will improve ?Outcome:  Progressing ?  ?Problem: Physical Regulation: ?Goal: Complications related to the disease process, condition or treatment will be avoided or minimized ?Outcome: Progressing ?  ?Problem: Activity: ?Goal: Will identify at least one activity in which they can participate ?Outcome: Progressing ?  ?Problem: Coping: ?Goal: Demonstration of participation in decision-making regarding own care will improve ?Outcome: Progressing ?  ?

## 2021-05-11 DIAGNOSIS — F332 Major depressive disorder, recurrent severe without psychotic features: Secondary | ICD-10-CM | POA: Diagnosis not present

## 2021-05-11 NOTE — Group Note (Signed)
[place] LCSW Group Therapy Note ? ? ?Group Date: 05/11/2021 ?Start Time: [id number] ?End Time: 1615 ? ? ?Type of Therapy/Topic:  Group Therapy:  Emotion Regulation ? ?Participation Level:  Active  ? ? ? ?Description of Group:   ? The purpose of this group is to assist patients in learning to regulate negative emotions and experience positive emotions. Patients will be guided to discuss ways in which they have been vulnerable to their negative emotions. These vulnerabilities will be juxtaposed with experiences of positive emotions or situations, and patients challenged to use positive emotions to combat negative ones. Special emphasis will be placed on coping with negative emotions in conflict situations, and patients will process healthy conflict resolution skills. ? ?Therapeutic Goals: ?Patient will identify two positive emotions or experiences to reflect on in order to balance out negative emotions:  ?Patient will label two or more emotions that they find the most difficult to experience:  ?Patient will be able to demonstrate positive conflict resolution skills through discussion or role plays:  ? ?Summary of Patient Progress: ? ? ?Patient was present for the entirety of the group session. Patient was an active listener and participated in the topic of discussion, provided helpful advice to others, and added nuance to topic of conversation. He stated that he felt that this round of treatment "was helpful and felt that he really did a '150' this time." He states he struggles with anger but sometimes does not know how to cope but will try to remain calm and walk away.  ? ? ? ? ?Therapeutic Modalities:   ?Cognitive Behavioral Therapy ?Feelings Identification ?Dialectical Behavioral Therapy ? ? ?Aashir Umholtz A [place], LCSWA ?

## 2021-05-11 NOTE — Progress Notes (Signed)
Recreation Therapy Notes ? ?Date: 05/11/2021 ?  ?Time: 1:20pm ?  ?Location: Craft room  ?  ?Behavioral response: N/A ?  ?Intervention Topic: Self-care  ?  ?Discussion/Intervention: ?Patient refused to attend group.  ?  ?Clinical Observations/Feedback:  ?Patient refused to attend group.  ?  ?Lamir Racca LRT/CTRS ? ? ? ? ? ? ? ?Venora Kautzman ?05/11/2021 2:54 PM ?

## 2021-05-11 NOTE — Progress Notes (Signed)
Patient is alert and oriented times 4.  He endorses anxiety and depression 10/10. Patient stated "I'm anxious about everything that is going in my life." " States he has a court case coming up." Denies si and hi. Endorsed AVH stating "I'm hearing voices telling me to hurt myself, and seeing things going by, I know there's nothing there but I see the shadow going by." Reports sleeping good last night. Meals consumed in the day room. Meds given whole by mouth without complication. Patient remains safe on the unit wit Q15 minute safety checks.  ?

## 2021-05-11 NOTE — Plan of Care (Signed)
?  Problem: Education: ?Goal: Knowledge of [facility] Education information/materials will improve ?Outcome: Progressing ?Goal: Emotional status will improve ?Outcome: Progressing ?Goal: Mental status will improve ?Outcome: Progressing ?Goal: Verbalization of understanding the information provided will improve ?Outcome: Progressing ?  ?Problem: Activity: ?Goal: Interest or engagement in activities will improve ?Outcome: Progressing ?Goal: Sleeping patterns will improve ?Outcome: Progressing ?  ?Problem: Coping: ?Goal: Ability to verbalize frustrations and anger appropriately will improve ?Outcome: Progressing ?Goal: Ability to demonstrate self-control will improve ?Outcome: Progressing ?  ?Problem: Health Behavior/Discharge Planning: ?Goal: Identification of resources available to assist in meeting health care needs will improve ?Outcome: Progressing ?Goal: Compliance with treatment plan for underlying cause of condition will improve ?Outcome: Progressing ?  ?Problem: Physical Regulation: ?Goal: Ability to maintain clinical measurements within normal limits will improve ?Outcome: Progressing ?  ?Problem: Safety: ?Goal: Periods of time without injury will increase ?Outcome: Progressing ?  ?Problem: Education: ?Goal: Utilization of techniques to improve thought processes will improve ?Outcome: Progressing ?Goal: Knowledge of the prescribed therapeutic regimen will improve ?Outcome: Progressing ?  ?Problem: Activity: ?Goal: Interest or engagement in leisure activities will improve ?Outcome: Progressing ?Goal: Imbalance in normal sleep/wake cycle will improve ?Outcome: Progressing ?  ?Problem: Coping: ?Goal: Coping ability will improve ?Outcome: Progressing ?Goal: Will verbalize feelings ?Outcome: Progressing ?  ?Problem: Health Behavior/Discharge Planning: ?Goal: Ability to make decisions will improve ?Outcome: Progressing ?Goal: Compliance with therapeutic regimen will improve ?Outcome: Progressing ?   ?Problem: Role Relationship: ?Goal: Will demonstrate positive changes in social behaviors and relationships ?Outcome: Progressing ?  ?Problem: Safety: ?Goal: Ability to disclose and discuss suicidal ideas will improve ?Outcome: Progressing ?Goal: Ability to identify and utilize support systems that promote safety will improve ?Outcome: Progressing ?  ?

## 2021-05-11 NOTE — Progress Notes (Signed)
Pt in bedroom; calm, cooperative. Pt states "I feel absolutely wonderful." Pt c/o L shoulder pain, which he describes as "sore" and radiates down his LUE and up to L neck; he rates his pain 8/10. He denies SI/HI at this time; however, he endorses auditory and visual hallucinations and states "I see a trace or two going across the hall and I hear voices but they have calmed down; they're not as loud." He reports that he sleeps good but he has vivid dreams about his aunt and uncle. He describes his appetite as good. He expresses feelings of anxiety and says "I'm wide open with my anxiety; I'm stressed" due to having to yell at another patient who was annoying him. He also reports depression, which he rates 5/10 due to his pending legal issues. He says that he feels like his medications are working. No acute distress noted. ?

## 2021-05-11 NOTE — [facility] Group Notes (Signed)
Group: outdoor courtyard PT participated in group. Friendly, kind and helpful to others'. ? ?

## 2021-05-11 NOTE — Progress Notes (Signed)
[facility] MD Progress Note ? ?05/11/2021 10:38 AM ?[name] Colon [name]  ?MRN:  [id number] ?Subjective: Joshwa is seen on rounds today.  He states he is feeling much better.  He is taking his medications as prescribed and denies any side effects.  There is no evidence of EPS or TD.  He is going to go back to live with his roommate.  He states that they have worked their problems out.  We discussed discharge and follow-up plans. ? ?Principal Problem: Major depressive disorder, recurrent severe without psychotic features (HCC) ?Diagnosis: Principal Problem: ?  Major depressive disorder, recurrent severe without psychotic features (HCC) ?Active Problems: ?  Cluster B personality disorder (HCC) ? ?Total Time spent with patient: 15 minutes ? ?Past Psychiatric History: History of alcohol abuse and chronic depression and mood instability.  He has been to [facility] in the past and outpatient at [facility].  He has had past diagnosis of major depressive disorder with and without psychotic features and bipolar disorder.  Obviously he has a history of cluster B personality disorder. ? ?Past Medical History:  ?Past Medical History:  ?Diagnosis Date  ? Anxiety   ? Arthritis   ? knees and hands  ? Bipolar disorder (HCC)   ? Depression   ? GERD (gastroesophageal reflux disease)   ? Hepatitis   ? HEP "C"  ? History of kidney stones   ? Hypertension   ? 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 Kidney stones   ? Pericarditis 05/2015  ? a. echo 5/17: EF 60-65%, no RWMA, LV dias fxn nl, LA mildly dilated, RV sys fxn nl, PASP nl, moderate sized circumferential pericardial effusion was identified, 2.12 cm around the LV free wall, <1 cm around the RV free wall. Features were not c/w tamponade physiology  ? PTSD (post-traumatic stress disorder)   ? Witnessed brother's suicide.  ? Restless leg syndrome   ? Syncope   ?  ?Past Surgical History:  ?Procedure Laterality Date  ? CYSTOSCOPY WITH URETEROSCOPY AND STENT PLACEMENT    ?  ESOPHAGOGASTRODUODENOSCOPY N/A 01/11/2016  ? Procedure: ESOPHAGOGASTRODUODENOSCOPY (EGD);  Surgeon: Charlott Rakes, MD;  Location: [facility] ENDOSCOPY;  Service: Endoscopy;  Laterality: N/A;  ? ESOPHAGOGASTRODUODENOSCOPY N/A 04/09/2020  ? Procedure: ESOPHAGOGASTRODUODENOSCOPY (EGD);  Surgeon: [name], MD;  Location: [facility] ENDOSCOPY;  Service: Gastroenterology;  Laterality: N/A;  ? INCISION AND DRAINAGE ABSCESS Left 01/02/2018  ? Procedure: INCISION AND DRAINAGE LEFT KNEE;  Surgeon: Deeann Saint, MD;  Location: [facility] ORS;  Service: Orthopedics;  Laterality: Left;  ? JOINT REPLACEMENT Right   ? TKR  ? KNEE ARTHROSCOPY Right 06/25/2014  ? Procedure: ARTHROSCOPY KNEE;  Surgeon: Deeann Saint, MD;  Location: [facility] ORS;  Service: Orthopedics;  Laterality: Right;  partial arthroscopic medial menisectomy  ? TOTAL KNEE ARTHROPLASTY Right 04/22/2015  ? Procedure: TOTAL KNEE ARTHROPLASTY;  Surgeon: Deeann Saint, MD;  Location: [facility] ORS;  Service: Orthopedics;  Laterality: Right;  ? TOTAL KNEE ARTHROPLASTY Left 10/30/2017  ? Procedure: TOTAL KNEE ARTHROPLASTY;  Surgeon: Deeann Saint, MD;  Location: [facility] ORS;  Service: Orthopedics;  Laterality: Left;  ? TOTAL KNEE REVISION Left 01/02/2018  ? Procedure: poly exchange of tibia and patella left knee;  Surgeon: Deeann Saint, MD;  Location: [facility] ORS;  Service: Orthopedics;  Laterality: Left;  ? ?Family History:  ?Family History  ?Problem Relation Age of Onset  ? CVA Mother   ?     deceased at age 14  ? Depression Brother   ?  Died by suicide at age 59  ? ? ?Social History:  ?Social History  ? ?Substance and Sexual Activity  ?Alcohol Use Yes  ?   ?Social History  ? ?Substance and Sexual Activity  ?Drug Use Yes  ? Types: Marijuana  ?  ?Social History  ? ?Socioeconomic History  ? Marital status: Single  ?  Spouse name: Not on file  ? Number of children: Not on file  ? Years of education: Not on file  ? Highest education level: Not on file  ?Occupational History  ? Not on file   ?Tobacco Use  ? Smoking status: Former  ?  Packs/day: 0.75  ?  Years: 20.00  ?  Pack years: 15.00  ?  Types: Cigarettes  ?  Quit date: 05/16/1984  ?  Years since quitting: 37.0  ? Smokeless tobacco: Never  ?Vaping Use  ? Vaping Use: Never used  ?Substance and Sexual Activity  ? Alcohol use: Yes  ? Drug use: Yes  ?  Types: Marijuana  ? Sexual activity: Not on file  ?Other Topics Concern  ? Not on file  ?Social History Narrative  ? Not on file  ? ?Social Determinants of Health  ? ?Financial Resource Strain: Not on file  ?Food Insecurity: Not on file  ?Transportation Needs: Not on file  ?Physical Activity: Not on file  ?Stress: Not on file  ?Social Connections: Not on file  ? ?Additional Social History:  ?  ?  ?  ?  ?  ?  ?  ?  ?  ?  ?  ? ?Sleep: Good ? ?Appetite:  Good ? ?Current Medications: ?Current Facility-Administered Medications  ?Medication Dose Route Frequency Provider Last Rate Last Admin  ? acetaminophen (TYLENOL) tablet 650 mg  650 mg Oral Q6H PRN Charm RingsLord, Jamison Y, NP   650 mg at 05/11/21 [id number]  ? alum & mag hydroxide-simeth (MAALOX/MYLANTA) 200-200-20 MG/5ML suspension 30 mL  30 mL Oral Q4H PRN Charm RingsLord, Jamison Y, NP      ? doxepin (SINEQUAN) capsule 100 mg  100 mg Oral QHS Sarina IllHerrick, Dalinda Heidt Edward, DO   100 mg at 05/10/21 2223  ? enalapril (VASOTEC) tablet 10 mg  10 mg Oral Daily Charm RingsLord, Jamison Y, NP   10 mg at 05/11/21 0809  ? feeding supplement (ENSURE ENLIVE / ENSURE PLUS) liquid 237 mL  237 mL Oral BID BM Sarina IllHerrick, Amarrah Meinhart Edward, DO   237 mL at 05/11/21 [id number]  ? FLUoxetine (PROZAC) capsule 60 mg  60 mg Oral Daily Sarina IllHerrick, Rylea Selway Edward, DO   60 mg at 05/11/21 [id number]  ? gabapentin (NEURONTIN) capsule 400 mg  400 mg Oral TID Charm RingsLord, Jamison Y, NP   400 mg at 05/11/21 [id number]  ? magnesium hydroxide (MILK OF MAGNESIA) suspension 30 mL  30 mL Oral Daily PRN Charm RingsLord, Jamison Y, NP      ? multivitamin with minerals tablet 1 tablet  1 tablet Oral Daily Charm RingsLord, Jamison Y, NP   1 tablet at 05/11/21 [id number]  ? OLANZapine (ZYPREXA)  tablet 10 mg  10 mg Oral QPM Thalia PartyPaliy, Alisa, MD   10 mg at 05/10/21 1814  ? OLANZapine (ZYPREXA) tablet 5 mg  5 mg Oral q morning Thalia PartyPaliy, Alisa, MD   5 mg at 05/11/21 [id number]  ? pantoprazole (PROTONIX) EC tablet 40 mg  40 mg Oral Daily Charm RingsLord, Jamison Y, NP   40 mg at 05/11/21 [id number]  ? thiamine tablet 100 mg  100 mg Oral Daily Charm RingsLord, Jamison Y, NP  100 mg at 05/11/21 0810  ? traZODone (DESYREL) tablet 25 mg  25 mg Oral Q4H PRN Sarina Ill, DO   25 mg at 05/11/21 9201  ? ? ?Lab Results: No results found for this or any previous visit (from the past 48 hour(s)). ? ?Blood Alcohol level:  ?Lab Results  ?Component Value Date  ? ETH 156 (H) 04/30/2021  ? ETH 121 (H) 01/17/2021  ? ? ?Metabolic Disorder Labs: ?Lab Results  ?Component Value Date  ? HGBA1C 5.3 01/18/2021  ? MPG 105.41 01/18/2021  ? MPG 116.89 01/21/2019  ? ?No results found for: PROLACTIN ?Lab Results  ?Component Value Date  ? CHOL 202 (H) 01/18/2021  ? TRIG 172 (H) 01/18/2021  ? HDL 40 (L) 01/18/2021  ? CHOLHDL 5.1 01/18/2021  ? VLDL 34 01/18/2021  ? LDLCALC 128 (H) 01/18/2021  ? LDLCALC 98 01/07/2016  ? ? ?Physical Findings: ?AIMS: Facial and Oral Movements ?Muscles of Facial Expression: None, normal ?Lips and Perioral Area: None, normal ?Jaw: None, normal ?Tongue: None, normal,Extremity Movements ?Upper (arms, wrists, hands, fingers): None, normal ?Lower (legs, knees, ankles, toes): None, normal, Trunk Movements ?Neck, shoulders, hips: None, normal, Overall Severity ?Severity of abnormal movements (highest score from questions above): None, normal ?Incapacitation due to abnormal movements: None, normal ?Patient's awareness of abnormal movements (rate only patient's report): No Awareness, Dental Status ?Current problems with teeth and/or dentures?: No ?Does patient usually wear dentures?: No  ?CIWA:  CIWA-Ar Total: 0 ?COWS:    ? ?Musculoskeletal: ?Strength & Muscle Tone: within normal limits ?Gait & Station: normal ?Patient leans: N/A ? ?Psychiatric  Specialty Exam: ? ?Presentation  ?General Appearance: Disheveled; Casual ? ?Eye Contact:Fair ? ?Speech:Clear and Coherent ? ?Speech Volume:Normal ? ?Handedness:Right ? ? ?Mood and Affect  ?Mood:Anxious; Depressed; Hopel

## 2021-05-12 DIAGNOSIS — F332 Major depressive disorder, recurrent severe without psychotic features: Secondary | ICD-10-CM | POA: Diagnosis not present

## 2021-05-12 MED ORDER — TRAZODONE HCL 50 MG PO TABS: 25.0000 mg | ORAL_TABLET | Freq: Three times a day (TID) | ORAL | 3 refills | Status: DC | PRN

## 2021-05-12 MED ORDER — OLANZAPINE 10 MG PO TABS: 10.0000 mg | ORAL_TABLET | Freq: Every evening | ORAL | 3 refills | Status: DC

## 2021-05-12 MED ORDER — OLANZAPINE 5 MG PO TABS: 5.0000 mg | ORAL_TABLET | Freq: Every morning | ORAL | 3 refills | Status: DC

## 2021-05-12 MED ORDER — GABAPENTIN 400 MG PO CAPS
400.0000 mg | ORAL_CAPSULE | Freq: Three times a day (TID) | ORAL | 3 refills | Status: DC
Start: 2021-05-12 — End: 2022-04-18

## 2021-05-12 MED ORDER — FLUOXETINE HCL 20 MG PO CAPS: 60.0000 mg | ORAL_CAPSULE | Freq: Every day | ORAL | 3 refills | Status: DC

## 2021-05-12 MED ORDER — DOXEPIN HCL 100 MG PO CAPS: 100.0000 mg | ORAL_CAPSULE | Freq: Every day | ORAL | 3 refills | Status: DC

## 2021-05-12 NOTE — Plan of Care (Signed)
?  Problem: Coping Skills ?Goal: STG - Patient will identify 3 positive coping skills strategies to use for SI post d/c within 5 recreation therapy group sessions ?Description: STG - Patient will identify 3 positive coping skills strategies to use for SI post d/c within 5 recreation therapy group sessions ?05/12/2021 1442 by Alveria Apley, LRT ?Outcome: Adequate for Discharge ?05/12/2021 1442 by Alveria Apley, LRT ?Outcome: Adequate for Discharge ?  ?

## 2021-05-12 NOTE — Progress Notes (Signed)
Pt standing at nurses' station; calm, cooperative. Pt states "I'm actually feeling good." Pt c/o L neck pain that radiates down his L arm. He rates his pain 6/10 and describes it as "electrical; like a pulse". He denies SI/HI; however, he endorses auditory and visual hallucinations stating "I'll see a little bit and I still hear voices but they're soft." She describes his sleep and appetite as "good" and reports that he has not had any bad dreams. He expresses feelings of anxiety and depression, which he rates both 5/10, because his dog of 13 years has to be put in an animal shelter, as he will no longer be able to care for her once he is discharged. No acute distress noted. ?

## 2021-05-12 NOTE — Progress Notes (Signed)
D: Pt A & O X 3. Denies SI, HI, AVH and pain at this time. D/C home as ordered. Picked up in [facility] by his friend. ?A: D/C instructions reviewed with pt including electronic prescriptions and follow up appointment; compliance encouraged. All belongings from assigned locker given to pt at time of departure. Scheduled and PRN medications given with verbal education and effects monitored. Safety checks maintained without incident till time of d/c.  ?R: Pt receptive to care. Compliant with medications when offered. Denies adverse drug reactions when assessed. Verbalized understanding related to d/c instructions. Signed belonging sheet in agreement with items received from locker. Ambulatory with a steady gait. Appears to be in no physical distress at time of departure.   ?

## 2021-05-12 NOTE — Progress Notes (Signed)
Recreation Therapy Notes ? ?INPATIENT RECREATION TR PLAN ? ?Patient Details ?Name: [name] ?MRN: [id number] ?DOB: 07/11/1960 ?Today's Date: 05/12/2021 ? ?Rec Therapy Plan ?Is patient appropriate for Therapeutic Recreation?: Yes ?Treatment times per week: at least 3 ?Estimated Length of Stay: 5-7 days ?TR Treatment/Interventions: Group participation (Comment) ? ?Discharge Criteria ?Pt will be discharged from therapy if:: Discharged ?Treatment plan/goals/alternatives discussed and agreed upon by:: Patient/family ? ?Discharge Summary ?Short term goals set: Patient will identify 3 positive coping skills strategies to use for SI post d/c within 5 recreation therapy group sessions ?Short term goals met: Adequate for discharge ?Progress toward goals comments: Groups attended ?Which groups?: Wellness, Social skills ?Reason goals not met: N/A ?Therapeutic equipment acquired: N/A ?Reason patient discharged from therapy: Discharge from hospital ?Pt/family agrees with progress & goals achieved: Yes ?Date patient discharged from therapy: 05/12/21 ? ? ?Mardel Grudzien ?05/12/2021, 2:43 PM ?

## 2021-05-12 NOTE — B[facility] Suicide Risk Assessment (Signed)
[facility] Discharge Suicide Risk Assessment ? ? ?Principal Problem: Major depressive disorder, recurrent severe without psychotic features (HCC) ?Discharge Diagnoses: Principal Problem: ?  Major depressive disorder, recurrent severe without psychotic features (HCC) ?Active Problems: ?  Cluster B personality disorder (HCC) ? ? ?Total Time spent with patient: 1 hour ? ?Musculoskeletal: ?Strength & Muscle Tone: within normal limits ?Gait & Station: normal ?Patient leans: N/A ? ?Psychiatric Specialty Exam ? ?Presentation  ?General Appearance: Disheveled; Casual ? ?Eye Contact:Fair ? ?Speech:Clear and Coherent ? ?Speech Volume:Normal ? ?Handedness:Right ? ? ?Mood and Affect  ?Mood:Anxious; Depressed; Hopeless ? ?Duration of Depression Symptoms: Greater than two weeks ? ?Affect:Appropriate; Congruent; Depressed ? ? ?Thought Process  ?Thought Processes:Coherent ? ?Descriptions of Associations:Intact ? ?Orientation:Full (Time, Place and Person) ? ?Thought Content:Logical; WDL ? ?History of Schizophrenia/Schizoaffective disorder:No ? ?Duration of Psychotic Symptoms:N/A ? ?Hallucinations:No data recorded ?Ideas of Reference:None ? ?Suicidal Thoughts:No data recorded ?Homicidal Thoughts:No data recorded ? ?Sensorium  ?Memory:Immediate Fair ? ?Judgment:Impaired ? ?Insight:Fair ? ? ?Executive Functions  ?Concentration:Fair ? ?Attention Span:Fair ? ?Recall:Fair ? ?Fund of Knowledge:Fair ? ?Language:Fair ? ? ?Psychomotor Activity  ?Psychomotor Activity:No data recorded ? ?Assets  ?Assets:Communication Skills; Desire for Improvement ? ? ?Sleep  ?Sleep:No data recorded ? ?Physical Exam: ?Physical Exam ?Vitals and nursing note reviewed.  ?Constitutional:   ?   Appearance: Normal appearance. He is normal weight.  ?Neurological:  ?   General: No focal deficit present.  ?   Mental Status: He is alert and oriented to person, place, and time.  ?Psychiatric:     ?   Attention and Perception: Attention and perception normal.     ?   Mood and  Affect: Mood and affect normal.     ?   Speech: Speech normal.     ?   Behavior: Behavior normal. Behavior is cooperative.     ?   Thought Content: Thought content normal.     ?   Cognition and Memory: Cognition and memory normal.     ?   Judgment: Judgment normal.  ? ?Review of Systems  ?Constitutional: Negative.   ?HENT: Negative.    ?Eyes: Negative.   ?Respiratory: Negative.    ?Cardiovascular: Negative.   ?Gastrointestinal: Negative.   ?Genitourinary: Negative.   ?Musculoskeletal: Negative.   ?Skin: Negative.   ?Neurological: Negative.   ?Endo/Heme/Allergies: Negative.   ?Psychiatric/Behavioral: Negative.    ?Blood pressure 116/76, pulse 85, temperature (!) 97.5 ?F (36.4 ?C), temperature source Oral, resp. rate 20, height 5\' 6"  (1.676 m), weight 80.7 kg, SpO2 99 %. Body mass index is 28.73 kg/m?. ? ?Mental Status Per Nursing Assessment::   ?On Admission:  Suicidal ideation indicated by patient, Self-harm thoughts ? ?Demographic Factors:  ?Male, Caucasian, and Unemployed ? ?Loss Factors: ?NA ? ?Historical Factors: ?Impulsivity ? ?Risk Reduction Factors:   ?Positive coping skills or problem solving skills ? ?Continued Clinical Symptoms:  ?Previous Psychiatric Diagnoses and Treatments ? ?Cognitive Features That Contribute To Risk:  ?None   ? ?Suicide Risk:  ?Minimal: No identifiable suicidal ideation.  Patients presenting with no risk factors but with morbid ruminations; may be classified as minimal risk based on the severity of the depressive symptoms ? ? Follow-up Information   ? ? [facility] Follow up on 05/14/2021.   ?Why: You have a hospital follow up scheduled for Friday May 5th at 11am. You have an appointment scheduled with your provider, May 7 on Tuesday May 9th at 2pm. Please contact their office if you need to reschedule.  Thanks! ?Contact information: ?[address] ?[place] [address] ?[phone] ? ? ?  ?  ? ?  ?  ? ?  ? ? ?Plan Of Care/Follow-up recommendations:  [facility] ? ? ?Sarina Ill, DO ?05/12/2021, 9:48 AM ?
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
## 2021-05-13 NOTE — Progress Notes (Signed)
?  [facility] Adult Case Management Discharge Plan : ? ?Will you be returning to the same living situation after discharge:  Yes,  pt is returning to his living situation ?At discharge, do you have transportation home?: Yes,  pt's friend is providing transportation home ?Do you have the ability to pay for your medications: Yes,  pt has [place] Medicaid ? ?Release of information consent forms completed and in the chart;  Patient's signature needed at discharge. ? ?Patient to Follow up at: ? Follow-up Information   ? ? [facility] Follow up on 05/14/2021.   ?Why: You have a hospital follow up scheduled for Friday May 5th at 11am. You have an appointment scheduled with your provider, Merlene Pulling on Tuesday May 9th at 2pm. Please contact their office if you need to reschedule. Thanks! ?Contact information: ?[address] ?[place] [address] ?[phone] ? ? ?  ?  ? ?  ?  ? ?  ? ? ?Next level of care provider has access to [facility] Link:no ? ?Safety Planning and Suicide Prevention discussed: Yes,  SPE completed with pt ? ?  ? ?Has patient been referred to the Quitline?: Patient refused referral ? ?Patient has been referred for addiction treatment: Pt. refused referral ? ?Lunette Tapp A [place], LCSWA ?05/13/2021, 8:53 AM ?

## 2021-05-15 ENCOUNTER — Emergency Department
Admission: EM | Admit: 2021-05-15 | Discharge: 2021-05-16 | Disposition: A | Discharge status: Home / Self Care | Payer: Medicaid Other | Attending: Emergency Medicine | Admitting: Emergency Medicine

## 2021-05-15 ENCOUNTER — Other Ambulatory Visit: Payer: Self-pay

## 2021-05-15 DIAGNOSIS — F6089 Other specific personality disorders: Secondary | ICD-10-CM | POA: Diagnosis not present

## 2021-05-15 DIAGNOSIS — F331 Major depressive disorder, recurrent, moderate: Secondary | ICD-10-CM | POA: Insufficient documentation

## 2021-05-15 DIAGNOSIS — Z87891 Personal history of nicotine dependence: Secondary | ICD-10-CM | POA: Insufficient documentation

## 2021-05-15 DIAGNOSIS — I10 Essential (primary) hypertension: Secondary | ICD-10-CM | POA: Diagnosis not present

## 2021-05-15 DIAGNOSIS — Z20822 Contact with and (suspected) exposure to covid-19: Secondary | ICD-10-CM | POA: Diagnosis not present

## 2021-05-15 DIAGNOSIS — Z96653 Presence of artificial knee joint, bilateral: Secondary | ICD-10-CM | POA: Diagnosis not present

## 2021-05-15 DIAGNOSIS — F609 Personality disorder, unspecified: Secondary | ICD-10-CM | POA: Diagnosis present

## 2021-05-15 DIAGNOSIS — F32A Depression, unspecified: Secondary | ICD-10-CM | POA: Diagnosis present

## 2021-05-15 DIAGNOSIS — R45851 Suicidal ideations: Secondary | ICD-10-CM | POA: Insufficient documentation

## 2021-05-15 LAB — URINE DRUG SCREEN, QUALITATIVE (ARMC ONLY)
Amphetamines, Ur Screen: NOT DETECTED
Barbiturates, Ur Screen: NOT DETECTED
Benzodiazepine, Ur Scrn: NOT DETECTED
Cannabinoid 50 Ng, Ur ~~LOC~~: POSITIVE — AB
Cocaine Metabolite,Ur ~~LOC~~: POSITIVE — AB
MDMA (Ecstasy)Ur Screen: NOT DETECTED
Methadone Scn, Ur: NOT DETECTED
Opiate, Ur Screen: NOT DETECTED
Phencyclidine (PCP) Ur S: NOT DETECTED
Tricyclic, Ur Screen: POSITIVE — AB

## 2021-05-15 LAB — CBC
HCT: 44.2 % (ref 39.0–52.0)
Hemoglobin: 14.2 g/dL (ref 13.0–17.0)
MCH: 28.7 pg (ref 26.0–34.0)
MCHC: 32.1 g/dL (ref 30.0–36.0)
MCV: 89.5 fL (ref 80.0–100.0)
Platelets: 293 10*3/uL (ref 150–400)
RBC: 4.94 MIL/uL (ref 4.22–5.81)
RDW: 14.6 % (ref 11.5–15.5)
WBC: 7.2 10*3/uL (ref 4.0–10.5)
nRBC: 0 % (ref 0.0–0.2)

## 2021-05-15 LAB — COMPREHENSIVE METABOLIC PANEL
ALT: 38 U/L (ref 0–44)
AST: 30 U/L (ref 15–41)
Albumin: 3.9 g/dL (ref 3.5–5.0)
Alkaline Phosphatase: 76 U/L (ref 38–126)
Anion gap: 11 (ref 5–15)
BUN: 14 mg/dL (ref 6–20)
CO2: 21 mmol/L — ABNORMAL LOW (ref 22–32)
Calcium: 9.6 mg/dL (ref 8.9–10.3)
Chloride: 104 mmol/L (ref 98–111)
Creatinine, Ser: 1 mg/dL (ref 0.61–1.24)
GFR, Estimated: >60 mL/min (ref >60)
Glucose, Bld: 101 mg/dL — ABNORMAL HIGH (ref 70–99)
Potassium: 4.1 mmol/L (ref 3.5–5.1)
Sodium: 136 mmol/L (ref 135–145)
Total Bilirubin: 0.6 mg/dL (ref 0.3–1.2)
Total Protein: 7.8 g/dL (ref 6.5–8.1)

## 2021-05-15 LAB — RESP PANEL BY RT-PCR (FLU A&B, COVID) ARPGX2
Influenza A by PCR: NEGATIVE
Influenza B by PCR: NEGATIVE
SARS Coronavirus 2 by RT PCR: NEGATIVE

## 2021-05-15 LAB — SALICYLATE LEVEL: Salicylate Lvl: <7 mg/dL — ABNORMAL LOW (ref 7.0–30.0)

## 2021-05-15 LAB — ACETAMINOPHEN LEVEL: Acetaminophen (Tylenol), Serum: <10 ug/mL — ABNORMAL LOW (ref 10–30)

## 2021-05-15 LAB — ETHANOL: Alcohol, Ethyl (B): <10 mg/dL (ref <10)

## 2021-05-15 MED ORDER — TRAZODONE HCL 50 MG PO TABS: 25.0000 mg | ORAL_TABLET | Freq: Three times a day (TID) | ORAL | Status: DC | PRN

## 2021-05-15 MED ORDER — NALTREXONE HCL 50 MG PO TABS
50.0000 mg | ORAL_TABLET | Freq: Every day | ORAL | Status: DC
  Administered 2021-05-15 – 2021-05-16 (×2): 50 mg via ORAL
  Filled 2021-05-15 (×2): qty 1

## 2021-05-15 MED ORDER — OLANZAPINE 10 MG PO TABS
10.0000 mg | ORAL_TABLET | Freq: Every day | ORAL | Status: DC
  Administered 2021-05-15: 10 mg via ORAL
  Filled 2021-05-15: qty 1

## 2021-05-15 MED ORDER — FLUOXETINE HCL 20 MG PO CAPS
60.0000 mg | ORAL_CAPSULE | Freq: Every day | ORAL | Status: DC
  Administered 2021-05-16: 60 mg via ORAL
  Filled 2021-05-15: qty 3

## 2021-05-15 MED ORDER — DOXEPIN HCL 100 MG PO CAPS
100.0000 mg | ORAL_CAPSULE | Freq: Every day | ORAL | Status: DC
  Administered 2021-05-15: 100 mg via ORAL
  Filled 2021-05-15: qty 1

## 2021-05-15 MED ORDER — GABAPENTIN 300 MG PO CAPS
400.0000 mg | ORAL_CAPSULE | Freq: Three times a day (TID) | ORAL | Status: DC
  Administered 2021-05-15 – 2021-05-16 (×3): 400 mg via ORAL
  Filled 2021-05-15 (×3): qty 1

## 2021-05-15 MED ORDER — OLANZAPINE 5 MG PO TABS
5.0000 mg | ORAL_TABLET | Freq: Every morning | ORAL | Status: DC
  Administered 2021-05-15 – 2021-05-16 (×2): 5 mg via ORAL
  Filled 2021-05-15 (×2): qty 1

## 2021-05-15 MED ORDER — PANTOPRAZOLE SODIUM 40 MG PO TBEC
40.0000 mg | DELAYED_RELEASE_TABLET | Freq: Every day | ORAL | Status: DC
  Administered 2021-05-15 – 2021-05-16 (×2): 40 mg via ORAL
  Filled 2021-05-15 (×2): qty 1
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
## 2021-05-15 NOTE — ED Notes (Signed)
Pt belongings: ? ?Black and orange bag holds wallet and phone ?Blue jeans ?White shirt ?White socks ?Black underwear ?Gray shoes ?

## 2021-05-15 NOTE — Consult Note (Signed)
[facility] Face-to-Face Psychiatry Consult   Reason for Consult:  suicidal ideations Referring Physician:  EDP Patient Identification: [name] MRN:  [id number] Principal Diagnosis: Major depressive disorder, recurrent episode, moderate (HCC) Diagnosis:  Principal Problem:   Major depressive disorder, recurrent episode, moderate (HCC)   Total Time spent with patient: 45 minutes  Subjective:   [name] is a 61 y.o. male patient admitted with suicidal ideations.  HPI:  61 yo male presented to the ED with depression and feelings of "I just don't want to go on."  No active suicidal ideations, discharged from [facility] on 4/29.  He was with his significant other until her family deemed her incompetent and placed her in a home.  Then, he lost his housing and he was charged with fraud, elder abuse, and identity theft.  Self medicating with alcohol.  He has not followed up with his outpatient appointment at [facility].  No homicidal ideations, hallucinations, or withdrawal symptoms.  BAL negative.  Discussed coping skills and how he can follow up with [facility] on Monday, will discharge in the am.  Past Psychiatric History: depression, anxiety, borderline personality d/o  Risk to Self:  none Risk to Others:  none Prior Inpatient Therapy:  multiple Prior Outpatient Therapy: [facility]   Past Medical History:  Past Medical History:  Diagnosis Date   Anxiety    Arthritis    knees and hands   Bipolar disorder (HCC)    Depression    GERD (gastroesophageal reflux disease)    Hepatitis    HEP "C"   History of kidney stones    Hypertension    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 echo 5/17: EF 60-65%, no RWMA, LV dias fxn nl, LA mildly dilated, RV sys fxn nl, PASP nl, moderate sized circumferential pericardial effusion was identified, 2.12 cm around the LV free wall, <1 cm around the RV free wall. Features were not c/w tamponade physiology    PTSD (post-traumatic stress disorder)    Witnessed brother's suicide.   Restless leg syndrome    Syncope     Past Surgical History:  Procedure Laterality Date   CYSTOSCOPY WITH URETEROSCOPY AND STENT PLACEMENT     ESOPHAGOGASTRODUODENOSCOPY N/A 01/11/2016   Procedure: ESOPHAGOGASTRODUODENOSCOPY (EGD);  Surgeon: Charlott Rakes, MD;  Location: [facility] ENDOSCOPY;  Service: Endoscopy;  Laterality: N/A;   ESOPHAGOGASTRODUODENOSCOPY N/A 04/09/2020   Procedure: ESOPHAGOGASTRODUODENOSCOPY (EGD);  Surgeon: [name], MD;  Location: [facility] ENDOSCOPY;  Service: Gastroenterology;  Laterality: N/A;   INCISION AND DRAINAGE ABSCESS Left 01/02/2018   Procedure: INCISION AND DRAINAGE LEFT KNEE;  Surgeon: Deeann Saint, MD;  Location: [facility] ORS;  Service: Orthopedics;  Laterality: Left;   JOINT REPLACEMENT Right    TKR   KNEE ARTHROSCOPY Right 06/25/2014   Procedure: ARTHROSCOPY KNEE;  Surgeon: Deeann Saint, MD;  Location: [facility] ORS;  Service: Orthopedics;  Laterality: Right;  partial arthroscopic medial menisectomy   TOTAL KNEE ARTHROPLASTY Right 04/22/2015   Procedure: TOTAL KNEE ARTHROPLASTY;  Surgeon: Deeann Saint, MD;  Location: [facility] ORS;  Service: Orthopedics;  Laterality: Right;   TOTAL KNEE ARTHROPLASTY Left 10/30/2017   Procedure: TOTAL KNEE ARTHROPLASTY;  Surgeon: Deeann Saint, MD;  Location: [facility] ORS;  Service: Orthopedics;  Laterality: Left;   TOTAL KNEE REVISION Left 01/02/2018   Procedure: poly exchange of tibia and patella left knee;  Surgeon: Deeann Saint, MD;  Location: [facility] ORS;  Service: Orthopedics;  Laterality: Left;  Family History:  Family History  Problem Relation Age of Onset   CVA Mother        deceased at age 75   Depression Brother        Died by suicide at age 29   Family Psychiatric  History: see above Social History:  Social History   Substance and Sexual Activity  Alcohol Use Yes     Social History   Substance and Sexual Activity  Drug Use Yes   Types:  Marijuana    Social History   Socioeconomic History   Marital status: Single    Spouse name: Not on file   Number of children: Not on file   Years of education: Not on file   Highest education level: Not on file  Occupational History   Not on file  Tobacco Use   Smoking status: Former    Packs/day: 0.75    Years: 20.00    Pack years: 15.00    Types: Cigarettes    Quit date: 05/16/1984    Years since quitting: 37.0   Smokeless tobacco: Never  Vaping Use   Vaping Use: Never used  Substance and Sexual Activity   Alcohol use: Yes   Drug use: Yes    Types: Marijuana   Sexual activity: Not on file  Other Topics Concern   Not on file  Social History Narrative   Not on file   Social Determinants of Health   Financial Resource Strain: Not on file  Food Insecurity: Not on file  Transportation Needs: Not on file  Physical Activity: Not on file  Stress: Not on file  Social Connections: Not on file   Additional Social History:    Allergies:   Allergies  Allergen Reactions   Toradol [Ketorolac Tromethamine] Hives    Labs:  Results for orders placed or performed during the hospital encounter of 05/15/21 (from the past 48 hour(s))  Comprehensive metabolic panel     Status: Abnormal   Collection Time: 05/15/21 10:08 AM  Result Value Ref Range   Sodium 136 135 - 145 mmol/L   Potassium 4.1 3.5 - 5.1 mmol/L   Chloride 104 98 - 111 mmol/L   CO2 21 (L) 22 - 32 mmol/L   Glucose, Bld 101 (H) 70 - 99 mg/dL    Comment: Glucose reference range applies only to samples taken after fasting for at least 8 hours.   BUN 14 6 - 20 mg/dL   Creatinine, Ser 9.35 0.61 - 1.24 mg/dL   Calcium 9.6 8.9 - 70.1 mg/dL   Total Protein 7.8 6.5 - 8.1 g/dL   Albumin 3.9 3.5 - 5.0 g/dL   AST 30 15 - 41 U/L   ALT 38 0 - 44 U/L   Alkaline Phosphatase 76 38 - 126 U/L   Total Bilirubin 0.6 0.3 - 1.2 mg/dL   GFR, Estimated >77 >93 mL/min    Comment: (NOTE) Calculated using the CKD-EPI Creatinine  Equation (2021)    Anion gap 11 5 - 15    Comment: Performed at [facility], [address] [place], [place] [address]  Ethanol     Status: None   Collection Time: 05/15/21 10:08 AM  Result Value Ref Range   Alcohol, Ethyl (B) <10 <10 mg/dL    Comment: (NOTE) Lowest detectable limit for serum alcohol is 10 mg/dL.  For medical purposes only. Performed at [facility], [address] [place], [place] [address]   Salicylate level  Status: Abnormal   Collection Time: 05/15/21 10:08 AM  Result Value Ref Range   Salicylate Lvl <7.0 (L) 7.0 - 30.0 mg/dL    Comment: Performed at [facility], [address] [place], [place] [address]  Acetaminophen level     Status: Abnormal   Collection Time: 05/15/21 10:08 AM  Result Value Ref Range   Acetaminophen (Tylenol), Serum <10 (L) 10 - 30 ug/mL    Comment: (NOTE) Therapeutic concentrations vary significantly. A range of 10-30 ug/mL  may be an effective concentration for many patients. However, some  are best treated at concentrations outside of this range. Acetaminophen concentrations >150 ug/mL at 4 hours after ingestion  and >50 ug/mL at 12 hours after ingestion are often associated with  toxic reactions.  Performed at [facility], [address] [place], [place] [address]   cbc     Status: None   Collection Time: 05/15/21 10:08 AM  Result Value Ref Range   WBC 7.2 4.0 - 10.5 K/uL   RBC 4.94 4.22 - 5.81 MIL/uL   Hemoglobin 14.2 13.0 - 17.0 g/dL   HCT 09.8 11.9 - 14.7 %   MCV 89.5 80.0 - 100.0 fL   MCH 28.7 26.0 - 34.0 pg   MCHC 32.1 30.0 - 36.0 g/dL   RDW 82.9 56.2 - 13.0 %   Platelets 293 150 - 400 K/uL   nRBC 0.0 0.0 - 0.2 %    Comment: Performed at [facility], [address] [place], [place] [address]    No current facility-administered medications for this encounter.   Current Outpatient Medications  Medication Sig Dispense Refill   FLUoxetine (PROZAC) 20 MG  capsule Take 3 capsules (60 mg total) by mouth daily. 90 capsule 3   pantoprazole (PROTONIX) 40 MG tablet Take 1 tablet (40 mg total) by mouth daily. 30 tablet 1   doxepin (SINEQUAN) 100 MG capsule Take 1 capsule (100 mg total) by mouth at bedtime. 30 capsule 3   enalapril (VASOTEC) 10 MG tablet Take 1 tablet (10 mg total) by mouth daily. 10 tablet 0   gabapentin (NEURONTIN) 400 MG capsule Take 1 capsule (400 mg total) by mouth 3 (three) times daily. 90 capsule 3   naltrexone (DEPADE) 50 MG tablet Take 1 tablet (50 mg total) by mouth daily. (Patient not taking: Reported on 05/15/2021) 30 tablet 1   OLANZapine (ZYPREXA) 10 MG tablet Take 1 tablet (10 mg total) by mouth every evening. 30 tablet 3   OLANZapine (ZYPREXA) 5 MG tablet Take 1 tablet (5 mg total) by mouth every morning. 30 tablet 3   traZODone (DESYREL) 50 MG tablet Take 0.5 tablets (25 mg total) by mouth 3 (three) times daily as needed (Anxiety). 90 tablet 3    Musculoskeletal: Strength & Muscle Tone: within normal limits Gait & Station: normal Patient leans: N/A  Psychiatric Specialty Exam: Physical Exam Vitals and nursing note reviewed.  Constitutional:      Appearance: Normal appearance.  HENT:     Head: Normocephalic.     Nose: Nose normal.  Pulmonary:     Effort: Pulmonary effort is normal.  Musculoskeletal:        General: Normal range of motion.     Cervical back: Normal range of motion.  Neurological:     General: No focal deficit present.     Mental Status: He is alert and oriented to person, place, and time.  Psychiatric:        Attention and Perception:  Attention and perception normal.        Mood and Affect: Mood is anxious and depressed.        Speech: Speech normal.        Behavior: Behavior normal. Behavior is cooperative.        Thought Content: Thought content normal.        Cognition and Memory: Cognition and memory normal.        Judgment: Judgment normal.    Review of Systems   Psychiatric/Behavioral:  Positive for depression. The patient is nervous/anxious.   All other systems reviewed and are negative.  Blood pressure 126/84, pulse 88, temperature 97.9 F (36.6 C), temperature source Oral, resp. rate 16, height 5\' 6"  (1.676 m), weight 66 kg, SpO2 98 %.Body mass index is 23.48 kg/m.  General Appearance: Casual  Eye Contact:  Good  Speech:  Normal Rate  Volume:  Normal  Mood:  Anxious and Depressed  Affect:  Congruent  Thought Process:  Coherent  Orientation:  Full (Time, Place, and Person)  Thought Content:  WDL and Logical  Suicidal Thoughts:  intermittent, passive  Homicidal Thoughts:  No  Memory:  Immediate;   Good Recent;   Good Remote;   Good  Judgement:  Fair  Insight:  Fair  Psychomotor Activity:  Normal  Concentration:  Concentration: Good and Attention Span: Good  Recall:  Good  Fund of Knowledge:  Fair  Language:  Good  Akathisia:  No  Handed:  Right  AIMS (if indicated):     Assets:  Leisure Time Physical Health Resilience  ADL's:  Intact  Cognition:  WNL  Sleep:        Physical Exam: Physical Exam Vitals and nursing note reviewed.  Constitutional:      Appearance: Normal appearance.  HENT:     Head: Normocephalic.     Nose: Nose normal.  Pulmonary:     Effort: Pulmonary effort is normal.  Musculoskeletal:        General: Normal range of motion.     Cervical back: Normal range of motion.  Neurological:     General: No focal deficit present.     Mental Status: He is alert and oriented to person, place, and time.  Psychiatric:        Attention and Perception: Attention and perception normal.        Mood and Affect: Mood is anxious and depressed.        Speech: Speech normal.        Behavior: Behavior normal. Behavior is cooperative.        Thought Content: Thought content normal.        Cognition and Memory: Cognition and memory normal.        Judgment: Judgment normal.   Review of Systems  Psychiatric/Behavioral:   Positive for depression. The patient is nervous/anxious.   All other systems reviewed and are negative. Blood pressure 126/84, pulse 88, temperature 97.9 F (36.6 C), temperature source Oral, resp. rate 16, height 5\' 6"  (1.676 m), weight 66 kg, SpO2 98 %. Body mass index is 23.48 kg/m.  Treatment Plan Summary: Major depressive disorder, recurrent, moderate: Prozac 20 mg daily Zyprexa 5 mg in the and and 10 mg at bedtime  Alcohol abuse: Gabapentin 400 mg TID Naltrexone 50 mg daily  Anxiety: Trazodone 25 mg TID PRN  Insomnia: Doxepine 100 mg daily at bedtime  Disposition: Supportive therapy provided about ongoing stressors.  Nanine MeansJamison Nhan Qualley, NP 05/15/2021 12:21 PM

## 2021-05-15 NOTE — ED Provider Notes (Signed)
? ?[facility] ?Provider Note ? ? ? Event Date/Time  ? First MD Initiated Contact with Patient 05/15/21 1127   ?  (approximate) ? ? ?History  ? ?Suicidal ? ? ?HPI ? ?[name] is a 61 y.o. male with history of anxiety, bipolar disorder, depression, hypertension, PTSD presents with complaints of suicidal ideation.  Patient does not have a clear plan but reports that he is feeling extremely depressed and is suicidal.  He is here voluntarily.  He was recently discharged from inpatient unit. ?  ? ? ?Physical Exam  ? ?Triage Vital Signs: ?ED Triage Vitals  ?Enc Vitals Group  ?   BP 05/15/21 0958 126/84  ?   Pulse Rate 05/15/21 0958 88  ?   Resp 05/15/21 0958 16  ?   Temp 05/15/21 0958 97.9 ?F (36.6 ?C)  ?   Temp Source 05/15/21 0958 Oral  ?   SpO2 05/15/21 0958 98 %  ?   Weight 05/15/21 1011 66 kg (145 lb 8.1 oz)  ?   Height 05/15/21 1011 1.676 m (5\' 6" )  ?   Head Circumference --   ?   Peak Flow --   ?   Pain Score 05/15/21 1010 0  ?   Pain Loc --   ?   Pain Edu? --   ?   Excl. in GC? --   ? ? ?Most recent vital signs: ?Vitals:  ? 05/15/21 0958  ?BP: 126/84  ?Pulse: 88  ?Resp: 16  ?Temp: 97.9 ?F (36.6 ?C)  ?SpO2: 98%  ? ? ? ?General: Awake, no distress.  ?CV:  Good peripheral perfusion.  ?Resp:  Normal effort.  ?Abd:  No distention.  ?Other:   ? ? ?ED Results / Procedures / Treatments  ? ?Labs ?(all labs ordered are listed, but only abnormal results are displayed) ?Labs Reviewed  ?COMPREHENSIVE METABOLIC PANEL - Abnormal; Notable for the following components:  ?    Result Value  ? CO2 21 (*)   ? Glucose, Bld 101 (*)   ? All other components within normal limits  ?SALICYLATE LEVEL - Abnormal; Notable for the following components:  ? Salicylate Lvl <7.0 (*)   ? All other components within normal limits  ?ACETAMINOPHEN LEVEL - Abnormal; Notable for the following components:  ? Acetaminophen (Tylenol), Serum <10 (*)   ? All other components within normal limits  ?ETHANOL  ?CBC  ?URINE DRUG  SCREEN, QUALITATIVE ([facility] ONLY)  ? ? ? ?EKG ? ? ? ? ?RADIOLOGY ? ? ? ? ?PROCEDURES: ? ?Critical Care performed:  ? ?Procedures ? ? ?MEDICATIONS ORDERED IN ED: ?Medications  ?doxepin (SINEQUAN) capsule 100 mg (has no administration in time range)  ?FLUoxetine (PROZAC) capsule 60 mg (has no administration in time range)  ?gabapentin (NEURONTIN) capsule 400 mg (has no administration in time range)  ?naltrexone (DEPADE) tablet 50 mg (50 mg Oral Given 05/15/21 1316)  ?OLANZapine (ZYPREXA) tablet 10 mg (has no administration in time range)  ?OLANZapine (ZYPREXA) tablet 5 mg (5 mg Oral Given 05/15/21 1316)  ?pantoprazole (PROTONIX) EC tablet 40 mg (40 mg Oral Given 05/15/21 1316)  ?traZODone (DESYREL) tablet 25 mg (has no administration in time range)  ? ? ? ?IMPRESSION / MDM / ASSESSMENT AND PLAN / ED COURSE  ?I reviewed the triage vital signs and the nursing notes. ? ? ?Patient with a history of depression, anxiety, bipolar disorder, PTSD presents today with suicidal ideation. ? ?He is presented voluntarily and as such I would  not involuntarily commit him at this time as he contracts for safety. ? ?Lab work is been reviewed, CMP CBC are reassuring, acetaminophen level undetectable ? ?Patient is medically cleared for psychiatric evaluation. ? ?The patient has been placed in psychiatric observation due to the need to provide a safe environment for the patient while obtaining psychiatric consultation and evaluation, as well as ongoing medical and medication management to treat the patient's condition.  The patient has not been placed under full IVC at this time. ? ? ?Pending psychiatric consultation ? ? ?  ? ? ?FINAL CLINICAL IMPRESSION(S) / ED DIAGNOSES  ? ?Final diagnoses:  ?Suicidal ideation  ? ? ? ?Rx / DC Orders  ? ?ED Discharge Orders   ? ? None  ? ?  ? ? ? ?Note:  This document was prepared using Dragon voice recognition software and may include unintentional dictation errors. ?  ?Jene Every, MD ?05/15/21 1441 ? ?

## 2021-05-16 NOTE — ED Notes (Signed)
E-signature not working at this time. Pt verbalized understanding of D/C instructions, prescriptions and follow up care with no further questions at this time. Pt in NAD and ambulatory at time of D/C.  

## 2021-05-16 NOTE — ED Notes (Signed)
Pt given breakfast tray and drink 

## 2021-05-16 NOTE — ED Provider Notes (Signed)
Patient seen during afternoon rounds by psychiatry and cleared for discharge.  Discharged in stable condition. ?  ?Gilles Chiquito, MD ?05/16/21 1100 ? ?

## 2021-05-16 NOTE — ED Notes (Signed)
Pt given lunch tray at this time

## 2021-05-16 NOTE — Consult Note (Signed)
[facility] Face-to-Face Psychiatry Consult   Reason for Consult:  suicidal ideations Referring Physician:  EDP Patient Identification: [name] MRN:  [id number] Principal Diagnosis: Major depressive disorder, recurrent episode, moderate (HCC) Diagnosis:  Principal Problem:   Major depressive disorder, recurrent episode, moderate (HCC) Active Problems:   Cluster B personality disorder (HCC)   Total Time spent with patient: 45 minutes  Subjective:   [name] is a 61 y.o. male patient admitted with suicidal ideations.  Client is at his baseline, encouraged him to follow up with [facility] for therapy, medication management, and substance abuse issues.  No withdrawal symptoms or suicidal/homicidal ideations, hallucinations.  Psychiatrically stable for discharge.  HPI:  61 yo male presented to the ED with depression and feelings of "I just don't want to go on."  No active suicidal ideations, [facility] from [facility].  He was with his significant other until her family deemed her incompetent and placed her in a home.  Then, he lost his housing and he was charged with fraud, elder abuse, and identity theft.  Self medicating with alcohol and/or cocaine.  He has not followed up with his outpatient appointment at [facility].  No homicidal ideations, hallucinations, or withdrawal symptoms.  BAL negative.  Discussed coping skills and how he can follow up with [facility] on Monday, will discharge in the am.  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 echo 5/17: EF 60-65%, no RWMA, LV dias fxn nl, LA mildly dilated, RV sys fxn nl, PASP nl, moderate sized circumferential pericardial effusion was identified, 2.12 cm around the LV free wall, <1 cm around the RV free wall. Features were not c/w tamponade physiology   PTSD (post-traumatic stress disorder)    Witnessed brother's suicide.   Restless leg syndrome    Syncope     Past Surgical History:  Procedure Laterality Date   CYSTOSCOPY WITH URETEROSCOPY AND STENT PLACEMENT     ESOPHAGOGASTRODUODENOSCOPY N/A 01/11/2016   Procedure: ESOPHAGOGASTRODUODENOSCOPY (EGD);  Surgeon: Charlott Rakes, MD;  Location: [facility] ENDOSCOPY;  Service: Endoscopy;  Laterality: N/A;   ESOPHAGOGASTRODUODENOSCOPY N/A 04/09/2020   Procedure: ESOPHAGOGASTRODUODENOSCOPY (EGD);  Surgeon: [name], MD;  Location: [facility] ENDOSCOPY;  Service: Gastroenterology;  Laterality: N/A;   INCISION AND DRAINAGE ABSCESS Left 01/02/2018   Procedure: INCISION AND DRAINAGE LEFT KNEE;  Surgeon: Deeann Saint, MD;  Location: [facility] ORS;  Service: Orthopedics;  Laterality: Left;   JOINT REPLACEMENT Right    TKR   KNEE ARTHROSCOPY Right 06/25/2014   Procedure: ARTHROSCOPY KNEE;  Surgeon: Deeann Saint, MD;  Location: [facility] ORS;  Service: Orthopedics;  Laterality: Right;  partial arthroscopic medial menisectomy   TOTAL KNEE ARTHROPLASTY Right 04/22/2015   Procedure: TOTAL KNEE ARTHROPLASTY;  Surgeon: Deeann Saint, MD;  Location: [facility] ORS;  Service: Orthopedics;  Laterality: Right;   TOTAL KNEE ARTHROPLASTY Left 10/30/2017   Procedure: TOTAL KNEE ARTHROPLASTY;  Surgeon: Deeann Saint, MD;  Location: [facility] ORS;  Service: Orthopedics;  Laterality: Left;   TOTAL KNEE REVISION Left 01/02/2018   Procedure: poly exchange of tibia and patella left knee;  Surgeon: Deeann SaintMiller, Howard, MD;  Location: [facility] ORS;  Service: Orthopedics;  Laterality: Left;   Family History:  Family History  Problem Relation Age of Onset    CVA Mother        deceased at age 59   Depression Brother        Died by suicide at age 59   Family Psychiatric  History: see above Social History:  Social History   Substance and Sexual Activity  Alcohol Use Yes     Social History   Substance and Sexual Activity  Drug Use Yes   Types: Marijuana    Social History   Socioeconomic History   Marital status: Single    Spouse name: Not on file   Number of children: Not on file   Years of education: Not on file   Highest education level: Not on file  Occupational History   Not on file  Tobacco Use   Smoking status: Former    Packs/day: 0.75    Years: 20.00    Pack years: 15.00    Types: Cigarettes    Quit date: 05/16/1984    Years since quitting: 37.0   Smokeless tobacco: Never  Vaping Use   Vaping Use: Never used  Substance and Sexual Activity   Alcohol use: Yes   Drug use: Yes    Types: Marijuana   Sexual activity: Not on file  Other Topics Concern   Not on file  Social History Narrative   Not on file   Social Determinants of Health   Financial Resource Strain: Not on file  Food Insecurity: Not on file  Transportation Needs: Not on file  Physical Activity: Not on file  Stress: Not on file  Social Connections: Not on file   Additional Social History:    Allergies:   Allergies  Allergen Reactions   Toradol [Ketorolac Tromethamine] Hives    Labs:  Results for orders placed or performed during the hospital encounter of 05/15/21 (from the past 48 hour(s))  Comprehensive metabolic panel     Status: Abnormal   Collection Time: 05/15/21 10:08 AM  Result Value Ref Range   Sodium 136 135 - 145 mmol/L   Potassium 4.1 3.5 - 5.1 mmol/L   Chloride 104 98 - 111 mmol/L   CO2 21 (L) 22 - 32 mmol/L   Glucose, Bld 101 (H) 70 - 99 mg/dL    Comment: Glucose reference range applies only to samples taken after fasting for at least 8 hours.   BUN 14 6 - 20 mg/dL   Creatinine, Ser 5.361.00 0.61 - 1.24 mg/dL   Calcium  9.6 8.9 - 64.410.3 mg/dL   Total Protein 7.8 6.5 - 8.1 g/dL   Albumin 3.9 3.5 - 5.0 g/dL   AST 30 15 - 41 U/L   ALT 38 0 - 44 U/L   Alkaline Phosphatase 76 38 - 126 U/L   Total Bilirubin 0.6 0.3 - 1.2 mg/dL   GFR, Estimated >03>60 >47>60 mL/min    Comment: (NOTE) Calculated using the CKD-EPI Creatinine Equation (2021)    Anion gap 11 5 - 15    Comment: Performed at [facility], [address] [place], [place] [id number]  Ethanol     Status: None   Collection Time: 05/15/21 10:08 AM  Result Value Ref Range   Alcohol,  Ethyl (B) <10 <10 mg/dL    Comment: (NOTE) Lowest detectable limit for serum alcohol is 10 mg/dL.  For medical purposes only. Performed at [facility], [address] [place], [place] [address]   Salicylate level     Status: Abnormal   Collection Time: 05/15/21 10:08 AM  Result Value Ref Range   Salicylate Lvl <7.0 (L) 7.0 - 30.0 mg/dL    Comment: Performed at [facility], [address] [place], [place] [address]  Acetaminophen level     Status: Abnormal   Collection Time: 05/15/21 10:08 AM  Result Value Ref Range   Acetaminophen (Tylenol), Serum <10 (L) 10 - 30 ug/mL    Comment: (NOTE) Therapeutic concentrations vary significantly. A range of 10-30 ug/mL  may be an effective concentration for many patients. However, some  are best treated at concentrations outside of this range. Acetaminophen concentrations >150 ug/mL at 4 hours after ingestion  and >50 ug/mL at 12 hours after ingestion are often associated with  toxic reactions.  Performed at [facility], [address] [place], [place] [address]   cbc     Status: None   Collection Time: 05/15/21 10:08 AM  Result Value Ref Range   WBC 7.2 4.0 - 10.5 K/uL   RBC 4.94 4.22 - 5.81 MIL/uL   Hemoglobin 14.2 13.0 - 17.0 g/dL   HCT 16.9 67.8 - 93.8 %   MCV 89.5 80.0 - 100.0 fL   MCH 28.7 26.0 - 34.0 pg   MCHC 32.1 30.0 - 36.0 g/dL   RDW 10.1 75.1 - 02.5 %   Platelets  293 150 - 400 K/uL   nRBC 0.0 0.0 - 0.2 %    Comment: Performed at [facility], [address], [place], [place] [address]  Resp Panel by RT-PCR (Flu A&B, Covid) Nasopharyngeal Swab     Status: None   Collection Time: 05/15/21 10:08 AM   Specimen: Nasopharyngeal Swab; Nasopharyngeal(NP) swabs in vial transport medium  Result Value Ref Range   SARS Coronavirus 2 by RT PCR NEGATIVE NEGATIVE    Comment: (NOTE) SARS-CoV-2 target nucleic acids are NOT DETECTED.  The SARS-CoV-2 RNA is generally detectable in upper respiratory specimens during the acute phase of infection. The lowest concentration of SARS-CoV-2 viral copies this assay can detect is 138 copies/mL. A negative result does not preclude SARS-Cov-2 infection and should not be used as the sole basis for treatment or other patient management decisions. A negative result may occur with  improper specimen collection/handling, submission of specimen other than nasopharyngeal swab, presence of viral mutation(s) within the areas targeted by this assay, and inadequate number of viral copies(<138 copies/mL). A negative result must be combined with clinical observations, patient history, and epidemiological information. The expected result is Negative.  Fact Sheet for Patients:  BloggerCourse.com  Fact Sheet for Healthcare Providers:  SeriousBroker.it  This test is no t yet approved or cleared by the [place] FDA and  has been authorized for detection and/or diagnosis of SARS-CoV-2 by FDA under an Emergency Use Authorization (EUA). This EUA will remain  in effect (meaning this test can be used) for the duration of the COVID-19 declaration under Section 564(b)(1) of the Act, 21 U.S.C.section 360bbb-3(b)(1), unless the authorization is terminated  or revoked sooner.       Influenza A by PCR NEGATIVE NEGATIVE   Influenza B by PCR NEGATIVE NEGATIVE    Comment:  (NOTE) The Xpert Xpress SARS-CoV-2/FLU/RSV plus assay is intended as an  aid in the diagnosis of influenza from Nasopharyngeal swab specimens and should not be used as a sole basis for treatment. Nasal washings and aspirates are unacceptable for Xpert Xpress SARS-CoV-2/FLU/RSV testing.  Fact Sheet for Patients: BloggerCourse.com  Fact Sheet for Healthcare Providers: SeriousBroker.it  This test is not yet approved or cleared by the [place] FDA and has been authorized for detection and/or diagnosis of SARS-CoV-2 by FDA under an Emergency Use Authorization (EUA). This EUA will remain in effect (meaning this test can be used) for the duration of the COVID-19 declaration under Section 564(b)(1) of the Act, 21 U.S.C. section 360bbb-3(b)(1), unless the authorization is terminated or revoked.  Performed at [facility], [address] [place] [address] [place], [place] [address]   Urine Drug Screen, Qualitative     Status: Abnormal   Collection Time: 05/15/21  4:32 PM  Result Value Ref Range   Tricyclic, Ur Screen POSITIVE (A) NONE DETECTED   Amphetamines, Ur Screen NONE DETECTED NONE DETECTED   MDMA (Ecstasy)Ur Screen NONE DETECTED NONE DETECTED   Cocaine Metabolite,Ur [place] POSITIVE (A) NONE DETECTED   Opiate, Ur Screen NONE DETECTED NONE DETECTED   Phencyclidine (PCP) Ur S NONE DETECTED NONE DETECTED   Cannabinoid 50 Ng, Ur [place] POSITIVE (A) NONE DETECTED   Barbiturates, Ur Screen NONE DETECTED NONE DETECTED   Benzodiazepine, Ur Scrn NONE DETECTED NONE DETECTED   Methadone Scn, Ur NONE DETECTED NONE DETECTED    Comment: (NOTE) Tricyclics + metabolites, urine    Cutoff 1000 ng/mL Amphetamines + metabolites, urine  Cutoff 1000 ng/mL MDMA (Ecstasy), urine              Cutoff 500 ng/mL Cocaine Metabolite, urine          Cutoff 300 ng/mL Opiate + metabolites, urine        Cutoff 300 ng/mL Phencyclidine (PCP), urine         Cutoff 25  ng/mL Cannabinoid, urine                 Cutoff 50 ng/mL Barbiturates + metabolites, urine  Cutoff 200 ng/mL Benzodiazepine, urine              Cutoff 200 ng/mL Methadone, urine                   Cutoff 300 ng/mL  The urine drug screen provides only a preliminary, unconfirmed analytical test result and should not be used for non-medical purposes. Clinical consideration and professional judgment should be applied to any positive drug screen result due to possible interfering substances. A more specific alternate chemical method must be used in order to obtain a confirmed analytical result. Gas chromatography / mass spectrometry (GC/MS) is the preferred confirm atory method. Performed at [facility], [address] [place], [place] [address]     Current Facility-Administered Medications  Medication Dose Route Frequency Provider Last Rate Last Admin   doxepin (SINEQUAN) capsule 100 mg  100 mg Oral QHS Charm Rings, NP   100 mg at 05/15/21 2117   FLUoxetine (PROZAC) capsule 60 mg  60 mg Oral Daily Charm Rings, NP       gabapentin (NEURONTIN) capsule 400 mg  400 mg Oral TID Charm Rings, NP   400 mg at 05/15/21 2117   naltrexone (DEPADE) tablet 50 mg  50 mg Oral Daily Charm Rings, NP   50 mg at 05/15/21 1316   OLANZapine (ZYPREXA) tablet 10 mg  10 mg Oral QHS Elizette Shek,  Herminio Heads, NP   10 mg at 05/15/21 2118   OLANZapine (ZYPREXA) tablet 5 mg  5 mg Oral q morning Charm Rings, NP   5 mg at 05/15/21 1316   pantoprazole (PROTONIX) EC tablet 40 mg  40 mg Oral Daily Charm Rings, NP   40 mg at 05/15/21 1316   traZODone (DESYREL) tablet 25 mg  25 mg Oral TID PRN Charm Rings, NP       Current Outpatient Medications  Medication Sig Dispense Refill   FLUoxetine (PROZAC) 20 MG capsule Take 3 capsules (60 mg total) by mouth daily. 90 capsule 3   pantoprazole (PROTONIX) 40 MG tablet Take 1 tablet (40 mg total) by mouth daily. 30 tablet 1   doxepin (SINEQUAN) 100 MG  capsule Take 1 capsule (100 mg total) by mouth at bedtime. 30 capsule 3   enalapril (VASOTEC) 10 MG tablet Take 1 tablet (10 mg total) by mouth daily. 10 tablet 0   gabapentin (NEURONTIN) 400 MG capsule Take 1 capsule (400 mg total) by mouth 3 (three) times daily. 90 capsule 3   naltrexone (DEPADE) 50 MG tablet Take 1 tablet (50 mg total) by mouth daily. (Patient not taking: Reported on 05/15/2021) 30 tablet 1   OLANZapine (ZYPREXA) 10 MG tablet Take 1 tablet (10 mg total) by mouth every evening. 30 tablet 3   OLANZapine (ZYPREXA) 5 MG tablet Take 1 tablet (5 mg total) by mouth every morning. 30 tablet 3   traZODone (DESYREL) 50 MG tablet Take 0.5 tablets (25 mg total) by mouth 3 (three) times daily as needed (Anxiety). 90 tablet 3    Musculoskeletal: Strength & Muscle Tone: within normal limits Gait & Station: normal Patient leans: N/A  Psychiatric Specialty Exam: Physical Exam Vitals and nursing note reviewed.  Constitutional:      Appearance: Normal appearance.  HENT:     Head: Normocephalic.     Nose: Nose normal.  Pulmonary:     Effort: Pulmonary effort is normal.  Musculoskeletal:        General: Normal range of motion.     Cervical back: Normal range of motion.  Neurological:     General: No focal deficit present.     Mental Status: He is alert and oriented to person, place, and time.  Psychiatric:        Attention and Perception: Attention and perception normal.        Mood and Affect: Mood is anxious and depressed.        Speech: Speech normal.        Behavior: Behavior normal. Behavior is cooperative.        Thought Content: Thought content normal.        Cognition and Memory: Cognition and memory normal.        Judgment: Judgment normal.    Review of Systems  Psychiatric/Behavioral:  Positive for depression. The patient is nervous/anxious.   All other systems reviewed and are negative.  Blood pressure 107/76, pulse 86, temperature 97.9 F (36.6 C), temperature  source Oral, resp. rate 20, height  (1.676 m), weight 66 kg, SpO2 99 %.Body mass index is 23.48 kg/m.  General Appearance: Casual  Eye Contact:  Good  Speech:  Normal Rate  Volume:  Normal  Mood:  Anxious and Depressed  Affect:  Congruent  Thought Process:  Coherent  Orientation:  Full (Time, Place, and Person)  Thought Content:  WDL and Logical  Suicidal Thoughts:  intermittent, passive  Homicidal  Thoughts:  No  Memory:  Immediate;   Good Recent;   Good Remote;   Good  Judgement:  Fair  Insight:  Fair  Psychomotor Activity:  Normal  Concentration:  Concentration: Good and Attention Span: Good  Recall:  Good  Fund of Knowledge:  Fair  Language:  Good  Akathisia:  No  Handed:  Right  AIMS (if indicated):     Assets:  Leisure Time Physical Health Resilience  ADL's:  Intact  Cognition:  WNL  Sleep:        Physical Exam: Physical Exam Vitals and nursing note reviewed.  Constitutional:      Appearance: Normal appearance.  HENT:     Head: Normocephalic.     Nose: Nose normal.  Pulmonary:     Effort: Pulmonary effort is normal.  Musculoskeletal:        General: Normal range of motion.     Cervical back: Normal range of motion.  Neurological:     General: No focal deficit present.     Mental Status: He is alert and oriented to person, place, and time.  Psychiatric:        Attention and Perception: Attention and perception normal.        Mood and Affect: Mood is anxious and depressed.        Speech: Speech normal.        Behavior: Behavior normal. Behavior is cooperative.        Thought Content: Thought content normal.        Cognition and Memory: Cognition and memory normal.        Judgment: Judgment normal.   Review of Systems  Psychiatric/Behavioral:  Positive for depression. The patient is nervous/anxious.   All other systems reviewed and are negative. Blood pressure 107/76, pulse 86, temperature 97.9 F (36.6 C), temperature source Oral, resp. rate 20,  height  (1.676 m), weight 66 kg, SpO2 99 %. Body mass index is 23.48 kg/m.  Treatment Plan Summary: Major depressive disorder, recurrent, moderate: Prozac 20 mg daily Zyprexa 5 mg in the and and 10 mg at bedtime  Alcohol abuse: Gabapentin 400 mg TID Naltrexone 50 mg daily  Anxiety: Trazodone 25 mg TID PRN  Insomnia: Doxepine 100 mg daily at bedtime  Disposition: Psychiatrically clear for discharge.  Nanine Means, NP 05/16/2021 10:05 AM

## 2021-05-27 ENCOUNTER — Emergency Department: Payer: Medicaid Other

## 2021-05-27 ENCOUNTER — Emergency Department
Admission: EM | Admit: 2021-05-27 | Discharge: 2021-05-27 | Disposition: A | Discharge status: Home / Self Care | Payer: Medicaid Other | Attending: Emergency Medicine | Admitting: Emergency Medicine

## 2021-05-27 ENCOUNTER — Encounter: Payer: Self-pay | Admitting: Emergency Medicine

## 2021-05-27 ENCOUNTER — Other Ambulatory Visit: Payer: Self-pay

## 2021-05-27 DIAGNOSIS — X500XXA Overexertion from strenuous movement or load, initial encounter: Secondary | ICD-10-CM | POA: Diagnosis not present

## 2021-05-27 DIAGNOSIS — I10 Essential (primary) hypertension: Secondary | ICD-10-CM | POA: Diagnosis not present

## 2021-05-27 DIAGNOSIS — M25562 Pain in left knee: Secondary | ICD-10-CM | POA: Diagnosis present

## 2021-05-27 LAB — CBC WITH DIFFERENTIAL/PLATELET
Abs Immature Granulocytes: 0.04 10*3/uL (ref 0.00–0.07)
Basophils Absolute: 0.1 10*3/uL (ref 0.0–0.1)
Basophils Relative: 1 %
Eosinophils Absolute: 0.1 10*3/uL (ref 0.0–0.5)
Eosinophils Relative: 2 %
HCT: 39.1 % (ref 39.0–52.0)
Hemoglobin: 12.6 g/dL — ABNORMAL LOW (ref 13.0–17.0)
Immature Granulocytes: 1 %
Lymphocytes Relative: 26 %
Lymphs Abs: 1.5 10*3/uL (ref 0.7–4.0)
MCH: 29 pg (ref 26.0–34.0)
MCHC: 32.2 g/dL (ref 30.0–36.0)
MCV: 90.1 fL (ref 80.0–100.0)
Monocytes Absolute: 0.5 10*3/uL (ref 0.1–1.0)
Monocytes Relative: 9 %
Neutro Abs: 3.5 10*3/uL (ref 1.7–7.7)
Neutrophils Relative %: 61 %
Platelets: 317 10*3/uL (ref 150–400)
RBC: 4.34 MIL/uL (ref 4.22–5.81)
RDW: 15.3 % (ref 11.5–15.5)
WBC: 5.8 10*3/uL (ref 4.0–10.5)
nRBC: 0 % (ref 0.0–0.2)

## 2021-05-27 LAB — SYNOVIAL CELL COUNT + DIFF, W/ CRYSTALS
Crystals, Fluid: NONE SEEN
Eosinophils-Synovial: 0 %
Lymphocytes-Synovial Fld: 48 %
Monocyte-Macrophage-Synovial Fluid: 8 %
Neutrophil, Synovial: 44 %
WBC, Synovial: 1585 /mm3 — ABNORMAL HIGH (ref 0–200)

## 2021-05-27 LAB — BASIC METABOLIC PANEL
Anion gap: 8 (ref 5–15)
BUN: 18 mg/dL (ref 6–20)
CO2: 21 mmol/L — ABNORMAL LOW (ref 22–32)
Calcium: 10.1 mg/dL (ref 8.9–10.3)
Chloride: 110 mmol/L (ref 98–111)
Creatinine, Ser: 1.04 mg/dL (ref 0.61–1.24)
GFR, Estimated: >60 mL/min (ref >60)
Glucose, Bld: 94 mg/dL (ref 70–99)
Potassium: 4.4 mmol/L (ref 3.5–5.1)
Sodium: 139 mmol/L (ref 135–145)

## 2021-05-27 LAB — SEDIMENTATION RATE: Sed Rate: 34 mm/hr — ABNORMAL HIGH (ref 0–20)

## 2021-05-27 LAB — URIC ACID: Uric Acid, Serum: 6.7 mg/dL (ref 3.7–8.6)

## 2021-05-27 MED ORDER — LIDOCAINE-EPINEPHRINE (PF) 2 %-1:200000 IJ SOLN
10.0000 mL | Freq: Once | INTRAMUSCULAR | Status: AC
  Administered 2021-05-27: 10 mL
  Filled 2021-05-27: qty 20

## 2021-05-27 MED ORDER — ACETAMINOPHEN 500 MG PO TABS
1000.0000 mg | ORAL_TABLET | Freq: Once | ORAL | Status: AC
  Administered 2021-05-27: 1000 mg via ORAL
  Filled 2021-05-27: qty 2

## 2021-05-27 MED ORDER — NAPROXEN 500 MG PO TABS: 500.0000 mg | ORAL_TABLET | Freq: Two times a day (BID) | ORAL | 0 refills | Status: DC

## 2021-05-27 MED ORDER — SULFAMETHOXAZOLE-TRIMETHOPRIM 800-160 MG PO TABS: 1.0000 | ORAL_TABLET | Freq: Two times a day (BID) | ORAL | 0 refills | Status: DC

## 2021-05-27 MED ORDER — CEPHALEXIN 500 MG PO CAPS: 500.0000 mg | ORAL_CAPSULE | Freq: Four times a day (QID) | ORAL | 0 refills | Status: DC

## 2021-05-27 NOTE — ED Triage Notes (Addendum)
Pt via POV from home. Pt has a hx of bilateral knee replacements with the most recent being in 2014. States he has been lifting  heavy objections. Pt states that he noticed L knee pain and swelling for the past 3 day. Noticeable swelling noticed to the L knee pain. Pt is A&OX4 and NAD. Ambulatory to triage.  Pt requesting to be given any narcotics, pt is a recovering addict.

## 2021-05-27 NOTE — ED Notes (Signed)
Medication pulled and at bedside for PA

## 2021-05-27 NOTE — Discharge Instructions (Addendum)
-  Take all your antibiotics as prescribed.  -You may take naproxen and Tylenol as needed for pain.  Utilize a brace as needed.  -Follow-up with the orthopedist listed above as discussed.  Call and schedule appointment tomorrow.

## 2021-05-27 NOTE — ED Provider Notes (Signed)
[facility] Provider Note    Event Date/Time   First MD Initiated Contact with Patient 05/27/21 1102     (approximate)   History   Chief Complaint Knee Pain   HPI [name] is a 61 y.o. male, history of hypertension, alcohol abuse, MDD, GERD, bipolar disorder, hepatitis, presents to the emergency department for evaluation of left-sided knee pain.  Patient states that he has been lifting a lot of heavy objects recently within the past couple weeks.  Over the past 7 days he has noticed a gradual onset of swelling in the left knee.  Endorses mild difficulty ambulating due to the pain.  Denies any acute injury or illness.  Denies fever/chills, cold sensation in extremity, numbness/tingling in lower extremities, thigh pain, lower leg pain, foot pain, chest pain, or abdominal pain. Denies any rashes or lesions.  History Limitations: No limitations.        Physical Exam  Triage Vital Signs: ED Triage Vitals  Enc Vitals Group     BP 05/27/21 1036 (!) 143/88     Pulse Rate 05/27/21 1036 96     Resp 05/27/21 1036 18     Temp 05/27/21 1036 98.1 F (36.7 C)     Temp src --      SpO2 05/27/21 1036 98 %     Weight 05/27/21 1034 180 lb (81.6 kg)     Height 05/27/21 1034 5\' 9"  (1.753 m)     Head Circumference --      Peak Flow --      Pain Score 05/27/21 1034 9     Pain Loc --      Pain Edu? --      Excl. in GC? --     Most recent vital signs: Vitals:   05/27/21 1036 05/27/21 1403  BP: (!) 143/88 (!) 151/103  Pulse: 96 88  Resp: 18 16  Temp: 98.1 F (36.7 C)   SpO2: 98% 96%    General: Awake, NAD.  Skin: Warm, dry. No rashes or lesions.  Eyes: PERRL. Conjunctivae normal.  CV: Good peripheral perfusion.  Resp: Normal effort.  Abd: Soft, non-tender. No distention.  Neuro: At baseline. No gross neurological deficits.   Focused Exam: Mild-moderate swelling appreciated diffusely in the left knee.  Patient maintains normal range of motion,  including full flexion and extension of the knee joint.  No surrounding warmth, erythema, or tenderness.  Negative anterior drawer.  Mild pain elicited with valgus/varus maneuvering, particular on the medial aspect.  Pulse, motor, sensation intact distally.   Physical Exam    ED Results / Procedures / Treatments  Labs (all labs ordered are listed, but only abnormal results are displayed) Labs Reviewed  BASIC METABOLIC PANEL - Abnormal; Notable for the following components:      Result Value   CO2 21 (*)    All other components within normal limits  CBC WITH DIFFERENTIAL/PLATELET - Abnormal; Notable for the following components:   Hemoglobin 12.6 (*)    All other components within normal limits  SEDIMENTATION RATE - Abnormal; Notable for the following components:   Sed Rate 34 (*)    All other components within normal limits  SYNOVIAL CELL COUNT + DIFF, W/ CRYSTALS - Abnormal; Notable for the following components:   Appearance-Synovial HAZY (*)    WBC, Synovial 1,585 (*)    All other components within normal limits  BODY FLUID CULTURE W GRAM STAIN  URIC ACID     EKG  N/A.   RADIOLOGY  ED Provider Interpretation: I personally viewed and interpreted this plain film, no evidence of fracture or dislocation based on my interpretation.  DG Hand Complete Right  Result Date: 05/28/2021 CLINICAL DATA:  Right hand swelling without known injury. EXAM: RIGHT HAND - COMPLETE 3+ VIEW COMPARISON:  None Available. FINDINGS: There is no evidence of fracture or dislocation. There is no evidence of arthropathy or other focal bone abnormality. Dorsal soft tissue swelling is noted. IMPRESSION: No fracture or dislocation is noted. Dorsal soft tissue swelling is noted which may be inflammatory in etiology. Electronically Signed   By: Marijo Conception M.D.   On: 05/28/2021 08:54    PROCEDURES:  Critical Care performed: N/A.  Marland KitchenJoint Aspiration/Arthrocentesis  Date/Time: 05/28/2021 11:16  AM Performed by: Teodoro Spray, PA Authorized by: Teodoro Spray, PA   Consent:    Consent obtained:  Verbal   Consent given by:  Patient   Risks discussed:  Bleeding, incomplete drainage, infection and pain   Alternatives discussed:  No treatment Universal protocol:    Patient identity confirmed:  Verbally with patient Location:    Location:  Knee   Knee:  L knee Anesthesia:    Anesthesia method:  Local infiltration   Local anesthetic:  Lidocaine 2% WITH epi Procedure details:    Needle gauge:  18 G   Ultrasound guidance: yes     Approach:  Superior   Aspirate amount:  50cc   Aspirate characteristics:  Yellow   Steroid injected: no     Specimen collected: yes   Post-procedure details:    Dressing:  Adhesive bandage   Procedure completion:  Tolerated well, no immediate complications    MEDICATIONS ORDERED IN ED: Medications  acetaminophen (TYLENOL) tablet 1,000 mg (1,000 mg Oral Given 05/27/21 1308)  lidocaine-EPINEPHrine (XYLOCAINE W/EPI) 2 %-1:200000 (PF) injection 10 mL (10 mLs Infiltration Given by Other 05/27/21 1549)     IMPRESSION / MDM / ASSESSMENT AND PLAN / ED COURSE  I reviewed the triage vital signs and the nursing notes.                              Differential diagnosis includes, but is not limited to, knee sprain, MCL/LCL injury, ACL/PCL injury, septic arthritis,   ED Course Patient appears well, vitals within normal limits.  NAD.  We will go ahead treat pain with Tylenol.  Patient specific does not want any opiates due to prior history of substance abuse.  CBC shows no leukocytosis or clinically significant anemia.  BMP unremarkable for electrolyte abnormalities or AKI.  Uric acid unremarkable at 6.7.  Unlikely gout.  Sanitation rate mildly elevated at 34, though consistent with previous values.   Assessment/Plan Patient presents with gradual onset of left knee swelling over the course of 7 days in the setting of recent heavy  lifting, though no acute injury.  Lab work-up has been reassuring.  Physical exam is unimpressive.  He is afebrile.  Very low suspicion for septic arthritis at this time.  Spoke with the patient's orthopedic surgeon who performed his arthroplasty, Dr. Sabra Heck, who advised performing arthrocentesis of the knee today and initiating cephalexin and Bactrim.  Advised that Dr. Harlow Mares will follow-up with the patient on an outpatient basis. Left knee arthrocentesis was performed with no immediate complications.  Patient states that his pain improved significantly.  Provide the patient with a knee brace for comfort, as well as a prescription  for naproxen.  We will plan to discharge.  Considered admission for this patient, but given the patient's stable presentation, unremarkable work-up, he is unlikely to benefit from admission.  Provided the patient with anticipatory guidance, return precautions, and educational material. Encouraged the patient to return to the emergency department at any time if they begin to experience any new or worsening symptoms. Patient expressed understanding and agreed with the plan.       FINAL CLINICAL IMPRESSION(S) / ED DIAGNOSES   Final diagnoses:  Acute pain of left knee     Rx / DC Orders   ED Discharge Orders          Ordered    cephALEXin (KEFLEX) 500 MG capsule  4 times daily        05/27/21 1611    sulfamethoxazole-trimethoprim (BACTRIM DS) 800-160 MG tablet  2 times daily        05/27/21 1611    naproxen (NAPROSYN) 500 MG tablet  2 times daily with meals        05/27/21 1616             Note:  This document was prepared using Dragon voice recognition software and may include unintentional dictation errors.   Teodoro Spray, [place] 05/28/21 1118    Vanessa Wabash, MD 05/28/21 [phone]

## 2021-05-28 ENCOUNTER — Emergency Department
Admission: EM | Admit: 2021-05-28 | Discharge: 2021-05-28 | Disposition: A | Discharge status: Home / Self Care | Payer: Medicaid Other | Attending: Emergency Medicine | Admitting: Emergency Medicine

## 2021-05-28 ENCOUNTER — Emergency Department: Payer: Medicaid Other

## 2021-05-28 ENCOUNTER — Encounter: Payer: Self-pay | Admitting: Intensive Care

## 2021-05-28 ENCOUNTER — Other Ambulatory Visit: Payer: Self-pay

## 2021-05-28 DIAGNOSIS — M79641 Pain in right hand: Secondary | ICD-10-CM | POA: Diagnosis present

## 2021-05-28 DIAGNOSIS — Z91199 Patient's noncompliance with other medical treatment and regimen due to unspecified reason: Secondary | ICD-10-CM

## 2021-05-28 DIAGNOSIS — M7989 Other specified soft tissue disorders: Secondary | ICD-10-CM | POA: Diagnosis not present

## 2021-05-28 DIAGNOSIS — Z760 Encounter for issue of repeat prescription: Secondary | ICD-10-CM | POA: Diagnosis not present

## 2021-05-28 DIAGNOSIS — I10 Essential (primary) hypertension: Secondary | ICD-10-CM | POA: Diagnosis not present

## 2021-05-28 MED ORDER — CEPHALEXIN 500 MG PO CAPS
500.0000 mg | ORAL_CAPSULE | Freq: Once | ORAL | Status: AC
  Administered 2021-05-28: 500 mg via ORAL
  Filled 2021-05-28: qty 1

## 2021-05-28 MED ORDER — ENALAPRIL MALEATE 10 MG PO TABS
10.0000 mg | ORAL_TABLET | Freq: Every day | ORAL | 0 refills | Status: DC
Start: 2021-05-28 — End: 2021-06-18

## 2021-05-28 MED ORDER — SULFAMETHOXAZOLE-TRIMETHOPRIM 800-160 MG PO TABS
1.0000 | ORAL_TABLET | Freq: Once | ORAL | Status: AC
Start: 2021-05-28 — End: 2021-05-28
  Administered 2021-05-28: 1 via ORAL
  Filled 2021-05-28: qty 1

## 2021-05-28 NOTE — ED Notes (Signed)
Pt attempted to sign for d/c paperwork/education but topaz frozen.  ?

## 2021-05-28 NOTE — ED Triage Notes (Addendum)
Patient presents with right hand swelling that he reports started last night. Reports he is out of his blood pressure medication. Seen here yesterday for left knee swelling and discharged home

## 2021-05-28 NOTE — Discharge Instructions (Addendum)
Call make an appointment with your primary care provider at [facility] clinic.  Begin using warm moist compresses to your hand frequently and elevate when possible.  You have had your first dose of antibiotics in the emergency department.  Pick up your antibiotics today and begin taking as directed.  Your enalapril was also sent to the pharmacy to begin taking.  You will need to have an appointment with your primary care provider in less than 30 days as you will run out because there is no refills on this particular prescription.  Return to the emergency department if any worsening of your hand.

## 2021-05-28 NOTE — ED Provider Notes (Signed)
[facility] Provider Note    Event Date/Time   First MD Initiated Contact with Patient 05/28/21 0831     (approximate)   History   Hand Pain   HPI  [name] is a 61 y.o. male   presents to the ED today with complaint of right hand swollen.  Patient states he went home after being seen in the emergency department yesterday for a problem with his left knee.  He states that he did not pick up the antibiotic or the anti-inflammatory yesterday but plans to do it this afternoon.  He denies any recent injury to his right hand but states that possibly 1 month ago he could have gotten something in his hand.  He also states that he is out of his blood pressure medication.  [facility] but has family who is going to pick up his medication.  He has a history of depression, bipolar, anxiety, pericarditis, hepatitis, hypertension, PTSD, RLS, and alcohol abuse.      Physical Exam   Triage Vital Signs: ED Triage Vitals  Enc Vitals Group     BP 05/28/21 0821 (!) 149/109     Pulse Rate 05/28/21 0821 97     Resp 05/28/21 0821 18     Temp 05/28/21 0821 97.6 F (36.4 C)     Temp Source 05/28/21 0821 Oral     SpO2 05/28/21 0821 96 %     Weight 05/28/21 0816 180 lb (81.6 kg)     Height 05/28/21 0816 5\' 9"  (1.753 m)     Head Circumference --      Peak Flow --      Pain Score 05/28/21 0816 8     Pain Loc --      Pain Edu? --      Excl. in GC? --     Most recent vital signs: Vitals:   05/28/21 0937 05/28/21 0955  BP: (!) 148/98   Pulse:  94  Resp:    Temp:    SpO2:  96%     General: Awake, no distress.  CV:  Good peripheral perfusion.  Resp:  Normal effort.  Abd:  No distention.  Other:  On examination of right hand the dorsal aspect is slightly edematous with old appearing wound over the MP joint of the third digit.  Patient is able to flex and extend digits without any difficulty.  Radial pulses present.  Skin is  intact at this time.   ED Results / Procedures / Treatments   Labs (all labs ordered are listed, but only abnormal results are displayed) Labs Reviewed - No data to display    RADIOLOGY  Right hand images were interpreted by myself and no foreign body or acute bony injury noted.  Radiology report was reviewed and agrees with my interpretation.   PROCEDURES:  Critical Care performed:   Procedures   MEDICATIONS ORDERED IN ED: Medications  cephALEXin (KEFLEX) capsule 500 mg (500 mg Oral Given 05/28/21 0938)  sulfamethoxazole-trimethoprim (BACTRIM DS) 800-160 MG per tablet 1 tablet (1 tablet Oral Given 05/28/21 05/30/21)     IMPRESSION / MDM / ASSESSMENT AND PLAN / ED COURSE  I reviewed the triage vital signs and the nursing notes.   Differential diagnosis includes, but is not limited to, right hand cellulitis, right hand fracture, degenerative arthritis, hypertension, noncompliant with medication.  61 year old male presents to the ED with complaint of right hand swelling this morning.  He denies any recent injury.  He was seen in the emergency department yesterday for his left knee at which time he was prescribed Keflex, Bactrim DS and naproxen which he has not picked up at this time.  There is an old scar noted to the dorsal aspect of his right hand.  X-rays were negative for foreign body or acute bony injury.  Patient is encouraged to pick up his medications at the pharmacy.  We gave him his first dose of antibiotics while in the emergency department.  His blood pressure medication was also refilled.  Patient's blood pressure prior to discharge was 148/98.        FINAL CLINICAL IMPRESSION(S) / ED DIAGNOSES   Final diagnoses:  Right hand pain  Hypertension, uncontrolled  Medically noncompliant  Encounter for medication refill     Rx / DC Orders   ED Discharge Orders          Ordered    enalapril (VASOTEC) 10 MG tablet  Daily        05/28/21 0933              Note:  This document was prepared using Dragon voice recognition software and may include unintentional dictation errors.   Tommi Rumps, PA-C 05/28/21 1240    Sharman Cheek, MD 05/29/21 2324

## 2021-05-28 NOTE — ED Notes (Addendum)
Pt has an infected area over his right 3rd finger with redness and swelling to the right hand, states that he is supposed to be on bp meds but has been out for awhile and hasn't seen his pcp in awhile and knows that he needs to get back on his medication, initially pt states that he hasn't had a recent injury to the right hand, but later in conversation mentioned that a month ago he had to have stitches due to cutting his hand on glass, pt reports that last night his right hand "blew up" with swelling, denies recent injury, pt was seen in the ER yesterday and had fluid drawn from his left knee and an antibiotic was prescribed, pt states that he hasn't started the antibiotic [facility] and his brother hasn't picked up the proscription yet

## 2021-05-29 ENCOUNTER — Other Ambulatory Visit: Payer: Self-pay

## 2021-05-29 ENCOUNTER — Emergency Department: Payer: Medicaid Other

## 2021-05-29 ENCOUNTER — Inpatient Hospital Stay
Admission: EM | Admit: 2021-05-29 | Discharge: 2021-06-09 | DRG: 982 | Disposition: A | Discharge status: Home / Self Care | Payer: Medicaid Other | Attending: Internal Medicine | Admitting: Internal Medicine

## 2021-05-29 DIAGNOSIS — K3580 Unspecified acute appendicitis: Secondary | ICD-10-CM | POA: Diagnosis not present

## 2021-05-29 DIAGNOSIS — Z5900 Homelessness unspecified: Secondary | ICD-10-CM | POA: Diagnosis not present

## 2021-05-29 DIAGNOSIS — M6510 Other infective (teno)synovitis, unspecified site: Principal | ICD-10-CM

## 2021-05-29 DIAGNOSIS — M65141 Other infective (teno)synovitis, right hand: Secondary | ICD-10-CM | POA: Diagnosis present

## 2021-05-29 DIAGNOSIS — M25462 Effusion, left knee: Secondary | ICD-10-CM | POA: Diagnosis present

## 2021-05-29 DIAGNOSIS — I10 Essential (primary) hypertension: Secondary | ICD-10-CM | POA: Diagnosis present

## 2021-05-29 DIAGNOSIS — F319 Bipolar disorder, unspecified: Secondary | ICD-10-CM

## 2021-05-29 DIAGNOSIS — F331 Major depressive disorder, recurrent, moderate: Secondary | ICD-10-CM | POA: Diagnosis not present

## 2021-05-29 DIAGNOSIS — Z818 Family history of other mental and behavioral disorders: Secondary | ICD-10-CM | POA: Diagnosis not present

## 2021-05-29 DIAGNOSIS — G2581 Restless legs syndrome: Secondary | ICD-10-CM | POA: Diagnosis present

## 2021-05-29 DIAGNOSIS — F6089 Other specific personality disorders: Secondary | ICD-10-CM | POA: Diagnosis present

## 2021-05-29 DIAGNOSIS — K5900 Constipation, unspecified: Secondary | ICD-10-CM | POA: Diagnosis not present

## 2021-05-29 DIAGNOSIS — L02511 Cutaneous abscess of right hand: Secondary | ICD-10-CM | POA: Diagnosis present

## 2021-05-29 DIAGNOSIS — L03113 Cellulitis of right upper limb: Secondary | ICD-10-CM | POA: Diagnosis present

## 2021-05-29 DIAGNOSIS — M19042 Primary osteoarthritis, left hand: Secondary | ICD-10-CM | POA: Diagnosis present

## 2021-05-29 DIAGNOSIS — Z87891 Personal history of nicotine dependence: Secondary | ICD-10-CM | POA: Diagnosis not present

## 2021-05-29 DIAGNOSIS — K219 Gastro-esophageal reflux disease without esophagitis: Secondary | ICD-10-CM | POA: Diagnosis present

## 2021-05-29 DIAGNOSIS — R5381 Other malaise: Secondary | ICD-10-CM | POA: Diagnosis present

## 2021-05-29 DIAGNOSIS — N2889 Other specified disorders of kidney and ureter: Secondary | ICD-10-CM | POA: Diagnosis present

## 2021-05-29 DIAGNOSIS — F329 Major depressive disorder, single episode, unspecified: Secondary | ICD-10-CM

## 2021-05-29 DIAGNOSIS — F32A Depression, unspecified: Secondary | ICD-10-CM

## 2021-05-29 DIAGNOSIS — M009 Pyogenic arthritis, unspecified: Secondary | ICD-10-CM

## 2021-05-29 DIAGNOSIS — F609 Personality disorder, unspecified: Secondary | ICD-10-CM | POA: Diagnosis present

## 2021-05-29 DIAGNOSIS — Z79899 Other long term (current) drug therapy: Secondary | ICD-10-CM

## 2021-05-29 DIAGNOSIS — F419 Anxiety disorder, unspecified: Secondary | ICD-10-CM | POA: Diagnosis present

## 2021-05-29 DIAGNOSIS — Z96653 Presence of artificial knee joint, bilateral: Secondary | ICD-10-CM | POA: Diagnosis present

## 2021-05-29 DIAGNOSIS — M25562 Pain in left knee: Secondary | ICD-10-CM | POA: Diagnosis present

## 2021-05-29 DIAGNOSIS — Z87442 Personal history of urinary calculi: Secondary | ICD-10-CM

## 2021-05-29 DIAGNOSIS — Z9152 Personal history of nonsuicidal self-harm: Secondary | ICD-10-CM | POA: Diagnosis not present

## 2021-05-29 DIAGNOSIS — M19041 Primary osteoarthritis, right hand: Secondary | ICD-10-CM | POA: Diagnosis present

## 2021-05-29 DIAGNOSIS — Z823 Family history of stroke: Secondary | ICD-10-CM | POA: Diagnosis not present

## 2021-05-29 DIAGNOSIS — F4310 Post-traumatic stress disorder, unspecified: Secondary | ICD-10-CM | POA: Diagnosis present

## 2021-05-29 DIAGNOSIS — K353 Acute appendicitis with localized peritonitis, without perforation or gangrene: Secondary | ICD-10-CM | POA: Diagnosis not present

## 2021-05-29 DIAGNOSIS — B9561 Methicillin susceptible Staphylococcus aureus infection as the cause of diseases classified elsewhere: Secondary | ICD-10-CM | POA: Diagnosis present

## 2021-05-29 DIAGNOSIS — K429 Umbilical hernia without obstruction or gangrene: Secondary | ICD-10-CM | POA: Diagnosis present

## 2021-05-29 DIAGNOSIS — F3132 Bipolar disorder, current episode depressed, moderate: Secondary | ICD-10-CM | POA: Diagnosis not present

## 2021-05-29 DIAGNOSIS — R45851 Suicidal ideations: Secondary | ICD-10-CM | POA: Diagnosis present

## 2021-05-29 LAB — CBC WITH DIFFERENTIAL/PLATELET
Abs Immature Granulocytes: 0.03 10*3/uL (ref 0.00–0.07)
Basophils Absolute: 0.1 10*3/uL (ref 0.0–0.1)
Basophils Relative: 1 %
Eosinophils Absolute: 0.1 10*3/uL (ref 0.0–0.5)
Eosinophils Relative: 2 %
HCT: 39.4 % (ref 39.0–52.0)
Hemoglobin: 12.6 g/dL — ABNORMAL LOW (ref 13.0–17.0)
Immature Granulocytes: 1 %
Lymphocytes Relative: 22 %
Lymphs Abs: 1.3 10*3/uL (ref 0.7–4.0)
MCH: 28.4 pg (ref 26.0–34.0)
MCHC: 32 g/dL (ref 30.0–36.0)
MCV: 88.9 fL (ref 80.0–100.0)
Monocytes Absolute: 0.4 10*3/uL (ref 0.1–1.0)
Monocytes Relative: 7 %
Neutro Abs: 4.2 10*3/uL (ref 1.7–7.7)
Neutrophils Relative %: 67 %
Platelets: 261 10*3/uL (ref 150–400)
RBC: 4.43 MIL/uL (ref 4.22–5.81)
RDW: 15 % (ref 11.5–15.5)
WBC: 6.1 10*3/uL (ref 4.0–10.5)
nRBC: 0 % (ref 0.0–0.2)

## 2021-05-29 LAB — CHLAMYDIA/NGC RT PCR (ARMC ONLY)
Chlamydia Tr: NOT DETECTED
N gonorrhoeae: NOT DETECTED

## 2021-05-29 LAB — URINALYSIS, ROUTINE W REFLEX MICROSCOPIC
Bilirubin Urine: NEGATIVE
Glucose, UA: NEGATIVE mg/dL
Hgb urine dipstick: NEGATIVE
Ketones, ur: NEGATIVE mg/dL
Leukocytes,Ua: NEGATIVE
Nitrite: NEGATIVE
Protein, ur: NEGATIVE mg/dL
Specific Gravity, Urine: 1.023 (ref 1.005–1.030)
pH: 7 (ref 5.0–8.0)

## 2021-05-29 LAB — COMPREHENSIVE METABOLIC PANEL
ALT: 27 U/L (ref 0–44)
AST: 24 U/L (ref 15–41)
Albumin: 3.8 g/dL (ref 3.5–5.0)
Alkaline Phosphatase: 69 U/L (ref 38–126)
Anion gap: 9 (ref 5–15)
BUN: 14 mg/dL (ref 6–20)
CO2: 25 mmol/L (ref 22–32)
Calcium: 9.9 mg/dL (ref 8.9–10.3)
Chloride: 104 mmol/L (ref 98–111)
Creatinine, Ser: 1.11 mg/dL (ref 0.61–1.24)
GFR, Estimated: >60 mL/min (ref >60)
Glucose, Bld: 137 mg/dL — ABNORMAL HIGH (ref 70–99)
Potassium: 4 mmol/L (ref 3.5–5.1)
Sodium: 138 mmol/L (ref 135–145)
Total Bilirubin: 0.6 mg/dL (ref 0.3–1.2)
Total Protein: 7.3 g/dL (ref 6.5–8.1)

## 2021-05-29 LAB — LACTIC ACID, PLASMA
Lactic Acid, Venous: 2.2 mmol/L (ref 0.5–1.9)
Lactic Acid, Venous: 1.3 mmol/L (ref 0.5–1.9)

## 2021-05-29 LAB — SEDIMENTATION RATE: Sed Rate: 17 mm/hr (ref 0–20)

## 2021-05-29 MED ORDER — ACETAMINOPHEN 650 MG RE SUPP: 650.0000 mg | Freq: Four times a day (QID) | RECTAL | Status: DC | PRN

## 2021-05-29 MED ORDER — VANCOMYCIN HCL IN DEXTROSE 1-5 GM/200ML-% IV SOLN
1000.0000 mg | Freq: Once | INTRAVENOUS | Status: AC
  Administered 2021-05-29: 1000 mg via INTRAVENOUS
  Filled 2021-05-29: qty 200

## 2021-05-29 MED ORDER — ONDANSETRON HCL 4 MG PO TABS: 4.0000 mg | ORAL_TABLET | Freq: Four times a day (QID) | ORAL | Status: DC | PRN

## 2021-05-29 MED ORDER — IOHEXOL 300 MG/ML  SOLN
100.0000 mL | Freq: Once | INTRAMUSCULAR | Status: AC | PRN
  Administered 2021-05-29: 100 mL via INTRAVENOUS

## 2021-05-29 MED ORDER — ONDANSETRON HCL 4 MG/2ML IJ SOLN
4.0000 mg | Freq: Once | INTRAMUSCULAR | Status: AC
Start: 2021-05-29 — End: 2021-05-29
  Administered 2021-05-29: 4 mg via INTRAVENOUS
  Filled 2021-05-29: qty 2

## 2021-05-29 MED ORDER — ENALAPRIL MALEATE 10 MG PO TABS
10.0000 mg | ORAL_TABLET | Freq: Every day | ORAL | Status: DC
  Administered 2021-05-31 – 2021-06-09 (×10): 10 mg via ORAL
  Filled 2021-05-29 (×11): qty 1

## 2021-05-29 MED ORDER — SODIUM CHLORIDE 0.9 % IV BOLUS
2000.0000 mL | Freq: Once | INTRAVENOUS | Status: AC
  Administered 2021-05-29: 2000 mL via INTRAVENOUS

## 2021-05-29 MED ORDER — FLUOXETINE HCL 20 MG PO CAPS
40.0000 mg | ORAL_CAPSULE | Freq: Two times a day (BID) | ORAL | Status: DC
  Administered 2021-05-29 – 2021-06-01 (×6): 40 mg via ORAL
  Filled 2021-05-29 (×6): qty 2

## 2021-05-29 MED ORDER — MORPHINE SULFATE (PF) 4 MG/ML IV SOLN
4.0000 mg | Freq: Once | INTRAVENOUS | Status: AC
  Administered 2021-05-29: 4 mg via INTRAVENOUS
  Filled 2021-05-29: qty 1

## 2021-05-29 MED ORDER — ACETAMINOPHEN 325 MG PO TABS: 650.0000 mg | ORAL_TABLET | Freq: Four times a day (QID) | ORAL | Status: DC | PRN

## 2021-05-29 MED ORDER — TRAZODONE HCL 50 MG PO TABS
25.0000 mg | ORAL_TABLET | Freq: Every evening | ORAL | Status: DC | PRN
  Administered 2021-05-29 – 2021-06-07 (×5): 25 mg via ORAL
  Filled 2021-05-29 (×6): qty 1

## 2021-05-29 MED ORDER — PANTOPRAZOLE SODIUM 40 MG PO TBEC
40.0000 mg | DELAYED_RELEASE_TABLET | Freq: Every day | ORAL | Status: DC
  Administered 2021-05-30 – 2021-06-09 (×11): 40 mg via ORAL
  Filled 2021-05-29 (×11): qty 1

## 2021-05-29 MED ORDER — MAGNESIUM HYDROXIDE 400 MG/5ML PO SUSP
30.0000 mL | Freq: Every day | ORAL | Status: DC | PRN
  Administered 2021-06-05: 30 mL via ORAL
  Filled 2021-05-29: qty 30

## 2021-05-29 MED ORDER — GABAPENTIN 400 MG PO CAPS
400.0000 mg | ORAL_CAPSULE | Freq: Three times a day (TID) | ORAL | Status: DC
  Administered 2021-05-29 – 2021-06-09 (×31): 400 mg via ORAL
  Filled 2021-05-29 (×32): qty 1

## 2021-05-29 MED ORDER — SODIUM CHLORIDE 0.9 % IV SOLN
1.0000 g | Freq: Once | INTRAVENOUS | Status: AC
  Administered 2021-05-29: 1 g via INTRAVENOUS
  Filled 2021-05-29: qty 10

## 2021-05-29 MED ORDER — ONDANSETRON HCL 4 MG/2ML IJ SOLN
4.0000 mg | Freq: Four times a day (QID) | INTRAMUSCULAR | Status: DC | PRN
Start: 2021-05-29 — End: 2021-06-09

## 2021-05-29 MED ORDER — ENOXAPARIN SODIUM 40 MG/0.4ML IJ SOSY
40.0000 mg | PREFILLED_SYRINGE | INTRAMUSCULAR | Status: DC
  Administered 2021-05-29 – 2021-06-08 (×10): 40 mg via SUBCUTANEOUS
  Filled 2021-05-29 (×10): qty 0.4

## 2021-05-29 MED ORDER — DOXEPIN HCL 100 MG PO CAPS
100.0000 mg | ORAL_CAPSULE | Freq: Every day | ORAL | Status: DC
  Administered 2021-05-29 – 2021-06-08 (×10): 100 mg via ORAL
  Filled 2021-05-29 (×11): qty 1

## 2021-05-29 MED ORDER — MORPHINE SULFATE (PF) 2 MG/ML IV SOLN
2.0000 mg | INTRAVENOUS | Status: DC | PRN
  Administered 2021-05-29 – 2021-05-30 (×3): 2 mg via INTRAVENOUS
  Filled 2021-05-29 (×3): qty 1

## 2021-05-29 MED ORDER — SODIUM CHLORIDE 0.9 % IV SOLN: INTRAVENOUS | Status: DC

## 2021-05-29 MED ORDER — TRAZODONE HCL 50 MG PO TABS
25.0000 mg | ORAL_TABLET | Freq: Three times a day (TID) | ORAL | Status: DC | PRN
  Administered 2021-05-30 – 2021-06-09 (×14): 25 mg via ORAL
  Filled 2021-05-29 (×14): qty 1

## 2021-05-29 NOTE — ED Provider Notes (Signed)
[facility] Provider Note  Patient Contact: 3:52 PM (approximate)   History   Joint Swelling   HPI  Can Colon Eaves is a 61 y.o. male who presents the emergency department complaining of worsening right hand and left knee pain.  Patient has been seen twice in the past 3 days for first his left knee, than his right hand.  He states that he is on antibiotics but both areas are significantly worsening.  Patient did have an arthrocentesis of his knee performed 3 days ago which did not have any significant white blood cell, any bacterial growth, no evidence of crystals concerning for gout.  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  States that this occurred in 2021.  He states that he had the replacement, shortly thereafter developed an infection and had to have the hardware removed and ultimately replaced.  States that initially he had pain in both joints but now he states that he feels very weak, tired, has significant malaise which is atypical for him.  No reported fevers but patient has felt chilled.  Patient denies any injuries to either the left knee or right hand prior to the onset of the symptoms.  States that he is still able to flex and extend the knee, flex and extend all fingers but both regions are having decreased range of motion, which she reports is not secondary to pain but more secondary to the infection/edema.  Patient denies any URI symptoms, chest pain, shortness of breath, nausea, vomiting, diarrhea, urinary changes.     Physical Exam   Triage Vital Signs: ED Triage Vitals  Enc Vitals Group     BP 05/29/21 1252 (!) 141/85     Pulse Rate 05/29/21 1252 (!) 110     Resp 05/29/21 1252 18     Temp 05/29/21 1252 98.1 F (36.7 C)     Temp Source 05/29/21 1252 Oral     SpO2 05/29/21 1252 95 %     Weight 05/29/21 1253 180 lb (81.6 kg)     Height 05/29/21 1253 5\' 8"  (1.727 m)     Head  Circumference --      Peak Flow --      Pain Score 05/29/21 1252 8     Pain Loc --      Pain Edu? --      Excl. in GC? --     Most recent vital signs: Vitals:   05/29/21 1700 05/29/21 1806  BP: 112/74 115/69  Pulse: 84 85  Resp: 11 17  Temp: 98.6 F (37 C) 97.9 F (36.6 C)  SpO2: 93% 96%     General: Alert and in no acute distress.  Cardiovascular:  Good peripheral perfusion Respiratory: Normal respiratory effort without tachypnea or retractions. Lungs CTAB.  Musculoskeletal: Full range of motion to all extremities.  Visualization of the left knee reveals an edematous left knee when compared with right.  Patient has an old surgical scar to the anterior aspect of the knee.  There is no open wounds.  Patient does have limited range of motion due to pain.  There does appear to be suprapatellar ballottement this is very mild.  Pain with extension and flexion.  Patient has pain, edema extending from the distal femur through the calf region.  The calf is very tender to palpation as well.  Dorsalis pedis pulse, posterior tibialis pulse is present.  Sensation intact distally.  Examination of the right hand reveals gross edema, erythema from the MCP joint, involving the entire left thumb extending to the mid forearm.  Patient is still able to flex and extend the fingers though there is reduced range of motion.  Sensation still intact.  Capillary refill intact.  Patient has a small scabbed over abrasion that he states he sustained over a month ago. Neurologic:  No gross focal neurologic deficits are appreciated.  Skin:   No rash noted Other:   ED Results / Procedures / Treatments   Labs (all labs ordered are listed, but only abnormal results are displayed) Labs Reviewed  LACTIC ACID, PLASMA - Abnormal; Notable for the following components:      Result Value   Lactic Acid, Venous 2.2 (*)    All other components within normal limits  COMPREHENSIVE METABOLIC PANEL - Abnormal; Notable for  the following components:   Glucose, Bld 137 (*)    All other components within normal limits  CBC WITH DIFFERENTIAL/PLATELET - Abnormal; Notable for the following components:   Hemoglobin 12.6 (*)    All other components within normal limits  CULTURE, BLOOD (ROUTINE X 2)  CULTURE, BLOOD (ROUTINE X 2)  CHLAMYDIA/NGC RT PCR ([facility] ONLY)            LACTIC ACID, PLASMA  SEDIMENTATION RATE  C-REACTIVE PROTEIN  URINALYSIS, ROUTINE W REFLEX MICROSCOPIC     EKG     RADIOLOGY  I personally viewed, evaluated, and interpreted these images as part of my medical decision making, as well as reviewing the written report by the radiologist.  ED Provider Interpretation: CT scan of the right hand reveals findings concerning for infectious tenosynovitis on the extensor tendon of the third digit.  Patient with additional soft tissue edema concerning for cellulitis to the right hand.  Imaging of the left knee revealed large joint effusion with synovial thickening concerning for septic arthritis.  No evidence of osteo myelitis to either the hand or the knee.  CT HAND RIGHT W CONTRAST  Result Date: 05/29/2021 CLINICAL DATA:  Right hand swelling. Soft tissue infection suspected, hand, xray done EXAM: CT OF THE UPPER RIGHT EXTREMITY WITH CONTRAST TECHNIQUE: Multidetector CT imaging of the upper right extremity was performed according to the standard protocol following intravenous contrast administration. RADIATION DOSE REDUCTION: This exam was performed according to the departmental dose-optimization program which includes automated exposure control, adjustment of the mA and/or kV according to patient size and/or use of iterative reconstruction technique. CONTRAST:  OMNIPAQUE IOHEXOL 300 MG/ML  SOLN COMPARISON:  X-ray 05/28/2021 FINDINGS: Bones/Joint/Cartilage No acute fracture. No dislocation. Mild degenerative changes of the hand, most pronounced at the first MCP joint. No erosion or periosteal  elevation. No large joint effusion is evident by CT. Ligaments Suboptimally assessed by CT. Muscles and Tendons Large volume of focal tenosynovitis involving the long finger extensor tendon centered at the third MCP joint measuring up to 3.0 x 0.8 x 1.9 cm (series 3, images 39-52). No additional tenosynovial fluid collections are evident. No intramuscular fluid collection identified. Soft tissues Diffuse soft tissue swelling with ill-defined fluid over the dorsum of the hand. No organized or rim enhancing fluid collection within the soft tissues. IMPRESSION: 1. Large volume of focal tenosynovitis involving the long finger extensor tendon centered at the third MCP joint measuring up to 3.0 x 0.8 x 1.9 cm. Findings concerning for septic tenosynovitis given the history. 2. Diffuse soft tissue swelling with ill-defined fluid over the dorsum of the hand,  suggestive of cellulitis. No organized or rim enhancing fluid collection within the soft tissues. 3. No evidence of osteomyelitis. Electronically Signed   By: Duanne GuessNicholas  Plundo D.O.   On: 05/29/2021 17:13   CT KNEE LEFT W CONTRAST  Result Date: 05/29/2021 CLINICAL DATA:  Left knee swelling for 3 days EXAM: CT OF THE LEFT KNEE WITH CONTRAST TECHNIQUE: Multidetector CT imaging was performed following the standard protocol during bolus administration of intravenous contrast. RADIATION DOSE REDUCTION: This exam was performed according to the departmental dose-optimization program which includes automated exposure control, adjustment of the mA and/or kV according to patient size and/or use of iterative reconstruction technique. 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 Metallic streak artifact from hardware slightly degrades evaluation of the adjacent structures. Within this limitation, there is no evidence of periprosthetic fracture  or loosening. Remaining osseous structures appear within normal limits. No evidence of erosion or periosteal elevation. Large knee joint effusion with mild diffuse synovial thickening. Ligaments Suboptimally assessed by CT. Muscles and Tendons Musculotendinous structures appear within normal limits by CT. Soft tissues No significant periarticular soft tissue findings. IMPRESSION: 1. Large knee joint effusion with mild diffuse synovial thickening, nonspecific in the postoperative setting. If there is clinical suspicion for infection, arthrocentesis should be performed to assess for septic arthritis. 2. 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 Electronically Signed   By: Duanne GuessNicholas  Plundo D.O.   On: 05/29/2021 17:03   US Venous Img Lower Unilateral Left  Result Date: 05/29/2021 CLINICAL DATA:  Swelling and pain for 2 weeks EXAM: LEFT LOWER EXTREMITY VENOUS DOPPLER ULTRASOUND TECHNIQUE: Gray-scale sonography with compression, as well as color and duplex ultrasound, were performed to evaluate the deep venous system(s) from the level of the common femoral vein through the popliteal and proximal calf veins. COMPARISON:  None FINDINGS: VENOUS Normal compressibility of the common femoral, superficial femoral, and popliteal veins, as well as the visualized calf veins. Visualized portions of profunda femoral vein and great saphenous vein unremarkable. No filling defects to suggest DVT on grayscale or color Doppler imaging. Doppler waveforms show normal direction of venous flow, normal respiratory plasticity and response to augmentation. Limited views of the contralateral common femoral vein are unremarkable. OTHER None. Limitations: none IMPRESSION: 1. No left lower extremity DVT 2. Nonspecific mildly enlarged left inguinal lymph node. Follow-up ultrasound should be performed in 4-6 weeks to document resolution. Electronically Signed   By: Acquanetta BellingFarhaan  Mir  M.D.   On: 05/29/2021 18:17    PROCEDURES:  Critical Care performed: No  Procedures   MEDICATIONS ORDERED IN ED: Medications  sodium chloride 0.9 % bolus 2,000 mL (0 mLs Intravenous Stopped 05/29/21 1806)  cefTRIAXone (ROCEPHIN) 1 g in sodium chloride 0.9 % 100 mL IVPB (0 g Intravenous Stopped 05/29/21 1657)  vancomycin (VANCOCIN) IVPB 1000 mg/200 mL premix (0 mg Intravenous Stopped 05/29/21 1806)  morphine (PF) 4 MG/ML injection 4 mg (4 mg Intravenous Given 05/29/21 1625)  ondansetron (ZOFRAN) injection 4 mg (4 mg Intravenous Given 05/29/21 1625)  iohexol (OMNIPAQUE) 300 MG/ML solution 100 mL (100 mLs Intravenous Contrast Given 05/29/21 1638)     IMPRESSION / MDM / ASSESSMENT AND PLAN / ED COURSE  I reviewed the triage vital signs and the nursing notes.  Differential diagnosis includes, but is not limited to, cellulitis of the knee, DVT, septic arthritis, infectious tenosynovitis, septic arthritis of the hand, osteomyelitis,   Patient's diagnosis is consistent with septic arthritis of the knee, infectious tenosynovitis of the hand.  Patient presented to the emergency department with worsening left knee and right hand pain.  Patient been seen twice in the last 3 days for similar complaints.  He had a arthrocentesis performed 3 days ago which only had 1000 white blood cell count.  At that time his exam was relatively reassuring with no gross signs of septic arthritis.  Patient also presented 2 days later for right hand pain and swelling.  He has been on antibiotics but has had worsening of both the left knee and the right hand.  Findings today were concerning for septic arthritis of the left knee and infectious process to the right hand.  Imaging reveals findings consistent with septic arthritis even though he had a reassuring tap 3 days ago, as well as findings of infectious tenosynovitis to the extensor tendon of the middle finger right hand.  I discussed the case  with on-call orthopedic surgeon, Dr. Hyacinth Meeker.  He advises that this patient will likely only need IV antibiotics and does not foresee surgical intervention.  As such she states that we can admit to the hospitalist for ongoing antibiotics and orthopedics will follow while he is here..  Patient has received Rocephin and vancomycin here.  We will admit to the hospitalist service at this time.        FINAL CLINICAL IMPRESSION(S) / ED DIAGNOSES   Final diagnoses:  Infectious tenosynovitis  Pyogenic arthritis of left knee joint, due to unspecified organism [facility])     Rx / DC Orders   ED Discharge Orders     None        Note:  This document was prepared using Dragon voice recognition software and may include unintentional dictation errors.   Lanette Hampshire 05/29/21 1937    Concha Se, MD 05/29/21 [phone]

## 2021-05-29 NOTE — Assessment & Plan Note (Addendum)
Presented with pain redness and swelling of the right hand primarily at the third MCP joint after having a piece of glass in that area several days prior to presenting. CT of the right hand concerning for right third finger extensor tendon tenosynovitis that could be septic. --Orthopedic surgery is following recommended conservative management here with IV antibiotics and close monitoring --Continue IV Vanc, cefepime --Hopefully transition to p.o. antibiotics as soon as tomorrow -- Warm compresses -- Pain management -- Elevate the right upper extremity -- Follow culture  5/22: Ortho performed bedside I&D and sent cultures

## 2021-05-29 NOTE — Assessment & Plan Note (Addendum)
Continue Prozac, trazodone, doxepin

## 2021-05-29 NOTE — ED Triage Notes (Signed)
Pt states he has had swelling in his L knee x3 days and now has swelling in his R hand- R hand is red and swollen- pt has a brace on the L knee- pt is on antibiotics

## 2021-05-29 NOTE — Assessment & Plan Note (Addendum)
Present on admission, concern for early septic arthritis.  Had been started on oral antibiotics after being seen in the ED 3 days prior to this presentation but had progressive worsening despite this. --Continue IV Vanc, cefepime - Pain management will be provided. - Orthopedic surgery, Dr. Sabra Heck is following

## 2021-05-29 NOTE — Assessment & Plan Note (Addendum)
Appears stable at this time. Formerly on lithium and reported on admission that is not available to him. -- follow-up with psychiatry

## 2021-05-29 NOTE — Assessment & Plan Note (Addendum)
Continue enalapril 

## 2021-05-29 NOTE — H&P (Addendum)
[place]   PATIENT NAME: [name]    MR#:  [id number]  DATE OF BIRTH:  1960/10/18  DATE OF ADMISSION:  05/29/2021  PRIMARY CARE PHYSICIAN: [name], PA   Patient is coming from: Home  REQUESTING/REFERRING PHYSICIAN: Cuthriell, Milana Huntsman  CHIEF COMPLAINT:   Chief Complaint  Patient presents with   Joint Swelling    HISTORY OF PRESENT ILLNESS:  Burt Colon Gotay is a 61 y.o. Caucasian male with medical history significant for bipolar disorder, anxiety, GERD, hypertension, urolithiasis and PTSD, who presented to the ER with acute onset of worsening right hand swelling with associated pain.  The patient stated that it started swelling about a couple weeks ago after having a bad piece of glass on his third MCP.  4 to 5 days ago he started having swelling in his left knee where he had his left TKA in 2014 that was complicated then by infection with septic arthritis and required washout procedure.  He was seen here in the ER 3 days ago for his left knee pain and swelling and had an arthrocentesis without evidence of septic arthritis 10.  He was given Keflex and Bactrim which she has been taking.  It was then seen again yesterday for worsening right hand swelling and was advised to continue on his antibiotics after negative work-up.  He admitted to mild chills without fever.  No chest pain or dyspnea or cough.  No nausea or vomiting or diarrhea or abdominal pain.  He admitted to headache.  No paresthesias except mild numbness in the right hand.  No focal muscle weakness.  No dysuria, oliguria or hematuria or flank pain.  ED Course: Upon presentation to the ER, BP was 141/85 with heart rate of 110 and later on 76.  Labs revealed unremarkable CMP and lactic acid was 2.2 and later 1.3.  CBC showed WBC of 6.1 and was otherwise unremarkable.  Fluid analysis on 5/18 of his left knee showed 1585 WBCs with 44 neutrophils and no crystals.  Imaging: Right hand x-ray yesterday  revealed dorsal soft tissue swelling that may be inflammatory with no fracture or dislocation.  4 view right knee x-ray on 5/18 revealed no acute abnormality and well situated left TKA components.. Right hand CT with contrast revealed the following: 1. Large volume of focal tenosynovitis involving the long finger extensor tendon centered at the third MCP joint measuring up to 3.0 x 0.8 x 1.9 cm. Findings concerning for septic tenosynovitis given the history. 2. Diffuse soft tissue swelling with ill-defined fluid over the dorsum of the hand, suggestive of cellulitis. No organized or rim enhancing fluid collection within the soft tissues. 3. No evidence of osteomyelitis.  Left knee CT with contrast revealed the following: 1. Large knee joint effusion with mild diffuse synovial thickening, nonspecific in the postoperative setting. If there is clinical suspicion for infection, arthrocentesis should be performed to assess for septic arthritis. 2. 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  Contact was made with Dr. Hyacinth Meeker who indicated that his right hand cellulitis and middle finger tenosynovitis can be managed here initially conservatively with IV antibiotics and that he may be having left knee septic arthritis.  The patient was given IV Rocephin and vancomycin as well as 4 mg of IV Zofran and 2 L of IV normal saline.  He will be admitted to a medical bed for further evaluation and management. 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 echo 5/17: EF 60-65%, no RWMA, LV dias fxn nl, LA mildly dilated, RV sys  fxn nl, PASP nl, moderate sized circumferential pericardial effusion was identified, 2.12 cm around the LV free wall, <1 cm around the RV free wall. Features were not c/w tamponade physiology   PTSD (post-traumatic stress disorder)    Witnessed brother's suicide.   Restless leg syndrome    Syncope     PAST SURGICAL HISTORY:   Past Surgical History:  Procedure Laterality Date   CYSTOSCOPY WITH URETEROSCOPY AND STENT PLACEMENT     ESOPHAGOGASTRODUODENOSCOPY N/A 01/11/2016   Procedure: ESOPHAGOGASTRODUODENOSCOPY (EGD);  Surgeon: Charlott Rakes, MD;  Location: [facility] ENDOSCOPY;  Service: Endoscopy;  Laterality: N/A;   ESOPHAGOGASTRODUODENOSCOPY N/A 04/09/2020   Procedure: ESOPHAGOGASTRODUODENOSCOPY (EGD);  Surgeon: [name], MD;  Location[facility] ENDOSCOPY;  Service: Gastroenterology;  Laterality: N/A;   INCISION AND DRAINAGE ABSCESS Left 01/02/2018   Procedure: INCISION AND DRAINAGE LEFT KNEE;  Surgeon: Deeann Saint, MD;  Location: [facility] ORS;  Service: Orthopedics;  Laterality: Left;   JOINT REPLACEMENT Right    TKR   KNEE ARTHROSCOPY Right 06/25/2014   Procedure: ARTHROSCOPY KNEE;  Surgeon: Deeann Saint, MD;  Location: [facility] ORS;  Service: Orthopedics;  Laterality: Right;  partial arthroscopic medial menisectomy   TOTAL KNEE ARTHROPLASTY Right 04/22/2015   Procedure: TOTAL KNEE ARTHROPLASTY;  Surgeon: Deeann Saint, MD;  Location: [facility] ORS;  Service: Orthopedics;  Laterality: Right;   TOTAL KNEE ARTHROPLASTY Left 10/30/2017   Procedure: TOTAL KNEE ARTHROPLASTY;  Surgeon: Deeann Saint, MD;  Location: [facility] ORS;  Service: Orthopedics;  Laterality: Left;   TOTAL KNEE REVISION Left 01/02/2018   Procedure: poly exchange of tibia and patella left knee;  Surgeon: Deeann Saint, MD;  Location: [facility] ORS;  Service: Orthopedics;  Laterality: Left;    SOCIAL HISTORY:   Social History   Tobacco Use   Smoking status: Former    Packs/day: 0.75    Years: 20.00    Pack years: 15.00    Types:  Cigarettes    Quit date: 05/16/1984    Years since quitting: 37.0   Smokeless tobacco: Never  Substance Use Topics   Alcohol use: Yes    FAMILY HISTORY:   Family History  Problem Relation Age of Onset   CVA Mother        deceased at age 38   Depression Brother        Died by suicide at age 67    DRUG ALLERGIES:   No Active Allergies   REVIEW OF SYSTEMS:   ROS As per history of present illness. All pertinent systems were reviewed above. Constitutional, HEENT, cardiovascular, respiratory, GI, GU, musculoskeletal, neuro, psychiatric, endocrine, integumentary and hematologic systems were reviewed and are otherwise negative/unremarkable except for positive findings mentioned above in the HPI.   MEDICATIONS AT HOME:   Prior to Admission medications   Medication Sig Start Date End Date Taking? Authorizing Provider  cephALEXin (KEFLEX) 500 MG capsule Take 1 capsule (500 mg total) by mouth 4 (four) times daily for 10 days. 05/27/21 06/06/21  Lodema Hong,  Bishop DublinBrandon Lee, PA  doxepin (SINEQUAN) 100 MG capsule Take 1 capsule (100 mg total) by mouth at bedtime. 05/12/21   Sarina IllHerrick, Richard Edward, DO  enalapril (VASOTEC) 10 MG tablet Take 1 tablet (10 mg total) by mouth daily. 05/28/21 06/27/21  Tommi RumpsSummers, Rhonda L, PA-C  FLUoxetine (PROZAC) 20 MG capsule Take 3 capsules (60 mg total) by mouth daily. Patient not taking: Reported on 05/29/2021 05/13/21   Sarina IllHerrick, Richard Edward, DO  FLUoxetine (PROZAC) 40 MG capsule Take 40 mg by mouth 2 (two) times daily. 05/24/21   [provider]  gabapentin (NEURONTIN) 400 MG capsule Take 1 capsule (400 mg total) by mouth 3 (three) times daily. 05/12/21   Sarina IllHerrick, Richard Edward, DO  lithium carbonate (LITHOBID) 300 MG CR tablet Take 300 mg by mouth 2 (two) times daily. 05/22/21   [provider]  naltrexone (DEPADE) 50 MG tablet Take 1 tablet (50 mg total) by mouth daily. Patient not taking: Reported on 05/15/2021 01/28/21   Clapacs, Jackquline DenmarkJohn T, MD  naproxen  (NAPROSYN) 500 MG tablet Take 1 tablet (500 mg total) by mouth 2 (two) times daily with a meal. 05/27/21 06/26/21  Varney DailySimpson, Brandon Lee, PA  OLANZapine (ZYPREXA) 10 MG tablet Take 1 tablet (10 mg total) by mouth every evening. 05/12/21   Sarina IllHerrick, Richard Edward, DO  OLANZapine (ZYPREXA) 5 MG tablet Take 1 tablet (5 mg total) by mouth every morning. 05/13/21   Sarina IllHerrick, Richard Edward, DO  pantoprazole (PROTONIX) 40 MG tablet Take 1 tablet (40 mg total) by mouth daily. 01/28/21 05/15/21  Clapacs, Jackquline DenmarkJohn T, MD  sulfamethoxazole-trimethoprim (BACTRIM DS) 800-160 MG tablet Take 1 tablet by mouth 2 (two) times daily for 10 days. 05/27/21 06/06/21  Varney DailySimpson, Brandon Lee, PA  traZODone (DESYREL) 50 MG tablet Take 0.5 tablets (25 mg total) by mouth 3 (three) times daily as needed (Anxiety). 05/12/21   Sarina IllHerrick, Richard Edward, DO      VITAL SIGNS:  Blood pressure 113/89, pulse 81, temperature 97.7 F (36.5 C), temperature source Oral, resp. rate 18, height 5\' 8"  (1.727 m), weight 81.6 kg, SpO2 99 %.  PHYSICAL EXAMINATION:  Physical Exam  GENERAL:  61 y.o.-year-old Caucasian male patient lying in the bed with no acute distress.  EYES: Pupils equal, round, reactive to light and accommodation. No scleral icterus. Extraocular muscles intact.  HEENT: Head atraumatic, normocephalic. Oropharynx and nasopharynx clear.  NECK:  Supple, no jugular venous distention. No thyroid enlargement, no tenderness.  LUNGS: Normal breath sounds bilaterally, no wheezing, rales,rhonchi or crepitation. No use of accessory muscles of respiration.  CARDIOVASCULAR: Regular rate and rhythm, S1, S2 normal. No murmurs, rubs, or gallops.  ABDOMEN: Soft, nondistended, nontender. Bowel sounds present. No organomegaly or mass.  EXTREMITIES: No pedal edema, cyanosis, or clubbing.  NEUROLOGIC: Cranial nerves II through XII are intact. Muscle strength 5/5 in all extremities. Sensation intact. Gait not checked.  PSYCHIATRIC: The patient is alert and  oriented x 3.  Normal affect and good eye contact. SKIN/musculoskeletal: Right dorsal hand swelling with associated erythema warmth and tenderness and increased swelling around right third MCP wound with mild extension to the proximal phalanx with decreased range of motion secondary to pain at the third MCP joint and wrist. Left diffuse knee swelling with bilateral joint tenderness and fluctuation with associated warmth and decreased range of motion secondary to pain.  No other obvious rash, lesion, or ulcer.          LABORATORY PANEL:   CBC Recent Labs  Lab 05/29/21 1255  WBC 6.1  HGB 12.6*  HCT 39.4  PLT 261   ------------------------------------------------------------------------------------------------------------------  Chemistries  Recent Labs  Lab 05/29/21 1255  NA 138  K 4.0  CL 104  CO2 25  GLUCOSE 137*  BUN 14  CREATININE 1.11  CALCIUM 9.9  AST 24  ALT 27  ALKPHOS 69  BILITOT 0.6   ------------------------------------------------------------------------------------------------------------------  Cardiac Enzymes No results for input(s): TROPONINI in the last 168 hours. ------------------------------------------------------------------------------------------------------------------  RADIOLOGY:  CT HAND RIGHT W CONTRAST  Result Date: 05/29/2021 CLINICAL DATA:  Right hand swelling. Soft tissue infection suspected, hand, xray done EXAM: CT OF THE UPPER RIGHT EXTREMITY WITH CONTRAST TECHNIQUE: Multidetector CT imaging of the upper right extremity was performed according to the standard protocol following intravenous contrast administration. RADIATION DOSE REDUCTION: This exam was performed according to the departmental dose-optimization program which includes automated exposure control, adjustment of the mA and/or kV according to patient size and/or use of iterative reconstruction technique. CONTRAST:  OMNIPAQUE IOHEXOL 300 MG/ML  SOLN COMPARISON:  X-ray  05/28/2021 FINDINGS: Bones/Joint/Cartilage No acute fracture. No dislocation. Mild degenerative changes of the hand, most pronounced at the first MCP joint. No erosion or periosteal elevation. No large joint effusion is evident by CT. Ligaments Suboptimally assessed by CT. Muscles and Tendons Large volume of focal tenosynovitis involving the long finger extensor tendon centered at the third MCP joint measuring up to 3.0 x 0.8 x 1.9 cm (series 3, images 39-52). No additional tenosynovial fluid collections are evident. No intramuscular fluid collection identified. Soft tissues Diffuse soft tissue swelling with ill-defined fluid over the dorsum of the hand. No organized or rim enhancing fluid collection within the soft tissues. IMPRESSION: 1. Large volume of focal tenosynovitis involving the long finger extensor tendon centered at the third MCP joint measuring up to 3.0 x 0.8 x 1.9 cm. Findings concerning for septic tenosynovitis given the history. 2. Diffuse soft tissue swelling with ill-defined fluid over the dorsum of the hand, suggestive of cellulitis. No organized or rim enhancing fluid collection within the soft tissues. 3. No evidence of osteomyelitis. Electronically Signed   By: Duanne Guess D.O.   On: 05/29/2021 17:13   CT KNEE LEFT W CONTRAST  Result Date: 05/29/2021 CLINICAL DATA:  Left knee swelling for 3 days EXAM: CT OF THE LEFT KNEE WITH CONTRAST TECHNIQUE: Multidetector CT imaging was performed following the standard protocol during bolus administration of intravenous contrast. RADIATION DOSE REDUCTION: This exam was performed according to the departmental dose-optimization program which includes automated exposure control, adjustment of the mA and/or kV according to patient size and/or use of iterative reconstruction technique. CONTRAST:  OMNIPAQUE IOHEXOL 300 MG/ML  SOLN COMPARISON:  X-ray 05/27/2021, CT 12/31/2017 FINDINGS: Bones/Joint/Cartilage Postsurgical changes from left total knee  arthroplasty. Metallic streak artifact from hardware slightly degrades evaluation of the adjacent structures. Within this limitation, there is no evidence of periprosthetic fracture or loosening. Remaining osseous structures appear within normal limits. No evidence of erosion or periosteal elevation. Large knee joint effusion with mild diffuse synovial thickening. Ligaments Suboptimally assessed by CT. Muscles and Tendons Musculotendinous structures appear within normal limits by CT. Soft tissues No significant periarticular soft tissue findings. IMPRESSION: 1. Large knee joint effusion with mild diffuse synovial thickening, nonspecific in the postoperative setting. If there is clinical suspicion for infection, arthrocentesis should be performed to assess for septic arthritis. 2. 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 Electronically Signed   By: Duanne Guess D.O.  On: 05/29/2021 17:03   US Venous Img Lower Unilateral Left  Result Date: 05/29/2021 CLINICAL DATA:  Swelling and pain for 2 weeks EXAM: LEFT LOWER EXTREMITY VENOUS DOPPLER ULTRASOUND TECHNIQUE: Gray-scale sonography with compression, as well as color and duplex ultrasound, were performed to evaluate the deep venous system(s) from the level of the common femoral vein through the popliteal and proximal calf veins. COMPARISON:  None FINDINGS: VENOUS Normal compressibility of the common femoral, superficial femoral, and popliteal veins, as well as the visualized calf veins. Visualized portions of profunda femoral vein and great saphenous vein unremarkable. No filling defects to suggest DVT on grayscale or color Doppler imaging. Doppler waveforms show normal direction of venous flow, normal respiratory plasticity and response to augmentation. Limited views of the contralateral common femoral vein are unremarkable. OTHER None. Limitations: none IMPRESSION: 1. No left lower  extremity DVT 2. Nonspecific mildly enlarged left inguinal lymph node. Follow-up ultrasound should be performed in 4-6 weeks to document resolution. Electronically Signed   By: Acquanetta Belling M.D.   On: 05/29/2021 18:17   DG Hand Complete Right  Result Date: 05/28/2021 CLINICAL DATA:  Right hand swelling without known injury. EXAM: RIGHT HAND - COMPLETE 3+ VIEW COMPARISON:  None Available. FINDINGS: There is no evidence of fracture or dislocation. There is no evidence of arthropathy or other focal bone abnormality. Dorsal soft tissue swelling is noted. IMPRESSION: No fracture or dislocation is noted. Dorsal soft tissue swelling is noted which may be inflammatory in etiology. Electronically Signed   By: Lupita Raider M.D.   On: 05/28/2021 08:54      IMPRESSION AND PLAN:  Assessment and Plan: * Cellulitis of right hand - This associated with right third finger extensor tendon tenosynovitis that could be septic. - The patient will be admitted to a medical bed. - We will continue antibiotic therapy with IV cefepime and vancomycin for severe nonpurulent cellulitis. - Warm compresses will be utilized - Pain management will be provided. - Orthopedic consultation will be obtained. Hyacinth Meeker was notified and is aware about the patient.  He indicated that the patient's hand cellulitis and tenosynovitis can be managed here.  Effusion of bursa of left knee - This is highly suspected of early septic arthritis. - We will continue antibiotic therapy as mentioned above with IV Rocephin and vancomycin. - Pain management will be provided. - Orthopedic consultation will be obtained as mentioned above.  Dr. Hyacinth Meeker is aware.  Essential hypertension -We will Continue enalapril.  Bipolar disorder (HCC) -He used to be on lithium and currently states that is not available to him. - This will need follow-up with his psychiatrist  Depression - We will continue Prozac and trazodone.  DVT prophylaxis: Lovenox..           .  Advanced Care Planning:  Code Status: full code.  Family Communication:  The plan of care was discussed in details with the patient (and family). I answered all questions. The patient agreed to proceed with the above mentioned plan. Further management will depend upon hospital course. Disposition Plan: Back to previous home environment Consults called: Orthopedic consult. All the records are reviewed and case discussed with ED provider.  Status is: Inpatient  At the time of the admission, it appears that the appropriate admission status for this patient is inpatient.  This is judged to be reasonable and necessary in order to provide the required intensity of service to ensure the patient's safety given the presenting symptoms, physical exam findings  and initial radiographic and laboratory data in the context of comorbid conditions.  The patient requires inpatient status due to high intensity of service, high risk of further deterioration and high frequency of surveillance required.  I certify that at the time of admission, it is my clinical judgment that the patient will require inpatient hospital care extending more than 2 midnights.                            Dispo: The patient is from: Home              Anticipated d/c is to: Home              Patient currently is not medically stable to d/c.              Difficult to place patient: No  Hannah Beat M.D on 05/30/2021 at 1:24 AM  [facility]   From 7 PM-7 AM, contact night-coverage [email or web address]  CC: Primary care physician; [name], PA

## 2021-05-29 NOTE — Plan of Care (Signed)
?  Problem: Clinical Measurements: ?Goal: Ability to avoid or minimize complications of infection will improve ?Outcome: Progressing ?  ?Problem: Skin Integrity: ?Goal: Skin integrity will improve ?Outcome: Progressing ?  ?

## 2021-05-29 NOTE — ED Notes (Signed)
2.2 lactic per lab

## 2021-05-30 DIAGNOSIS — L03113 Cellulitis of right upper limb: Secondary | ICD-10-CM | POA: Diagnosis not present

## 2021-05-30 LAB — CBC
HCT: 34.1 % — ABNORMAL LOW (ref 39.0–52.0)
Hemoglobin: 11.2 g/dL — ABNORMAL LOW (ref 13.0–17.0)
MCH: 29.3 pg (ref 26.0–34.0)
MCHC: 32.8 g/dL (ref 30.0–36.0)
MCV: 89.3 fL (ref 80.0–100.0)
Platelets: 230 10*3/uL (ref 150–400)
RBC: 3.82 MIL/uL — ABNORMAL LOW (ref 4.22–5.81)
RDW: 15.3 % (ref 11.5–15.5)
WBC: 5.3 10*3/uL (ref 4.0–10.5)
nRBC: 0 % (ref 0.0–0.2)

## 2021-05-30 LAB — BASIC METABOLIC PANEL
Anion gap: 5 (ref 5–15)
BUN: 14 mg/dL (ref 6–20)
CO2: 24 mmol/L (ref 22–32)
Calcium: 8.8 mg/dL — ABNORMAL LOW (ref 8.9–10.3)
Chloride: 109 mmol/L (ref 98–111)
Creatinine, Ser: 1.02 mg/dL (ref 0.61–1.24)
GFR, Estimated: >60 mL/min (ref >60)
Glucose, Bld: 98 mg/dL (ref 70–99)
Potassium: 4 mmol/L (ref 3.5–5.1)
Sodium: 138 mmol/L (ref 135–145)

## 2021-05-30 LAB — C-REACTIVE PROTEIN: CRP: 4.5 mg/dL — ABNORMAL HIGH (ref <1.0)

## 2021-05-30 LAB — HIV ANTIBODY (ROUTINE TESTING W REFLEX): HIV Screen 4th Generation wRfx: NONREACTIVE

## 2021-05-30 MED ORDER — MORPHINE SULFATE (PF) 2 MG/ML IV SOLN: 2.0000 mg | INTRAVENOUS | Status: DC | PRN

## 2021-05-30 MED ORDER — IBUPROFEN 400 MG PO TABS
400.0000 mg | ORAL_TABLET | Freq: Three times a day (TID) | ORAL | Status: DC
  Administered 2021-05-30 – 2021-06-09 (×27): 400 mg via ORAL
  Filled 2021-05-30 (×30): qty 1

## 2021-05-30 MED ORDER — ALUM & MAG HYDROXIDE-SIMETH 200-200-20 MG/5ML PO SUSP
15.0000 mL | Freq: Four times a day (QID) | ORAL | Status: DC | PRN
  Administered 2021-05-30 – 2021-06-04 (×3): 15 mL via ORAL
  Filled 2021-05-30 (×3): qty 30

## 2021-05-30 MED ORDER — OXYCODONE HCL 5 MG PO TABS
5.0000 mg | ORAL_TABLET | ORAL | Status: DC | PRN
  Administered 2021-05-30 – 2021-06-09 (×21): 5 mg via ORAL
  Filled 2021-05-30 (×21): qty 1

## 2021-05-30 MED ORDER — ACETAMINOPHEN 650 MG RE SUPP
650.0000 mg | Freq: Three times a day (TID) | RECTAL | Status: DC
  Filled 2021-05-30: qty 1

## 2021-05-30 MED ORDER — ACETAMINOPHEN 500 MG PO TABS
1000.0000 mg | ORAL_TABLET | Freq: Three times a day (TID) | ORAL | Status: DC
  Administered 2021-05-30 – 2021-06-09 (×26): 1000 mg via ORAL
  Filled 2021-05-30 (×28): qty 2

## 2021-05-30 MED ORDER — VANCOMYCIN HCL IN DEXTROSE 1-5 GM/200ML-% IV SOLN: 1000.0000 mg | Freq: Once | INTRAVENOUS | Status: DC

## 2021-05-30 MED ORDER — VANCOMYCIN HCL IN DEXTROSE 1-5 GM/200ML-% IV SOLN
1000.0000 mg | Freq: Two times a day (BID) | INTRAVENOUS | Status: DC
  Administered 2021-05-30 – 2021-06-03 (×9): 1000 mg via INTRAVENOUS
  Filled 2021-05-30 (×10): qty 200

## 2021-05-30 MED ORDER — FAMOTIDINE 20 MG PO TABS
20.0000 mg | ORAL_TABLET | Freq: Every day | ORAL | Status: DC
  Administered 2021-05-30 – 2021-06-09 (×11): 20 mg via ORAL
  Filled 2021-05-30 (×11): qty 1

## 2021-05-30 MED ORDER — VANCOMYCIN HCL 1250 MG/250ML IV SOLN
1250.0000 mg | Freq: Once | INTRAVENOUS | Status: AC
  Administered 2021-05-30: 1250 mg via INTRAVENOUS
  Filled 2021-05-30: qty 250

## 2021-05-30 MED ORDER — SODIUM CHLORIDE 0.9 % IV SOLN
2.0000 g | Freq: Three times a day (TID) | INTRAVENOUS | Status: DC
  Administered 2021-05-30 – 2021-06-02 (×11): 2 g via INTRAVENOUS
  Filled 2021-05-30 (×6): qty 12.5
  Filled 2021-05-30: qty 2
  Filled 2021-05-30 (×2): qty 12.5
  Filled 2021-05-30: qty 2
  Filled 2021-05-30 (×3): qty 12.5

## 2021-05-30 MED ORDER — VANCOMYCIN HCL 1750 MG/350ML IV SOLN: 1750.0000 mg | INTRAVENOUS | Status: DC

## 2021-05-30 NOTE — Consult Note (Signed)
ORTHOPAEDIC CONSULTATION  REQUESTING PHYSICIAN: Ezekiel Slocumb, DO  Chief Complaint: Right hand pain and swelling as well as left knee swelling  HPI: [name] is a 61 y.o. male who complains of right hand pain and swelling and left knee swelling.  Patient has been seen multiple times in the emergency room in the last week.  Initially he came 05/27/2021 for swelling in the left knee in the setting of doing a lot of heavy lifting and bending and squatting.  He has bilateral total knee replacements.  The left was infected and was revised in 2019 and he has not been seen in our office since then.  He has had no other treatment or problems with that he said.  The knee was aspirated in the emergency room 05/27/2021.  The cell count was only 1585.  The cultures did not grow any organisms.  He then presented to the emergency room 05/28/2021 with complaints of redness and swelling over the back of the right hand.  He did not recall any recent injury but thought that maybe a month or so before he had gotten caught.  At that time he has some redness and swelling and was given antibiotics and told to get Keflex and Septra.  He never picked up his prescriptions.  He then presented to the emergency room again yesterday 05/30/2021.  His hand was getting worse.  CT of the hand was done showing tenosynovitis of the right third extensor tendon complex.  He was admitted for IV antibiotics and consultation.  The patient denies any significant fever or chills or significant pain in the knee.  Right hand is moderately tender and painful.  His white count has never been elevated and he has not been febrile.  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 echo 5/17: EF 60-65%, no RWMA, LV dias fxn nl, LA mildly dilated, RV sys fxn nl, PASP nl, moderate sized circumferential pericardial effusion was identified, 2.12 cm around the LV free wall, <1 cm around the RV free wall. Features were not c/w tamponade physiology   PTSD (post-traumatic stress disorder)    Witnessed brother's suicide.   Restless leg syndrome    Syncope    Past Surgical History:  Procedure Laterality Date   CYSTOSCOPY WITH URETEROSCOPY AND STENT PLACEMENT     ESOPHAGOGASTRODUODENOSCOPY N/A 01/11/2016   Procedure: ESOPHAGOGASTRODUODENOSCOPY (EGD);  Surgeon: Wilford Corner, MD;  Location: [facility] ENDOSCOPY;  Service: Endoscopy;  Laterality: N/A;   ESOPHAGOGASTRODUODENOSCOPY N/A 04/09/2020   Procedure: ESOPHAGOGASTRODUODENOSCOPY (EGD);  Surgeon: Jonathon Bellows, MD;  Location: [facility] ENDOSCOPY;  Service: Gastroenterology;  Laterality: N/A;   INCISION AND DRAINAGE ABSCESS Left 01/02/2018   Procedure: INCISION AND DRAINAGE LEFT KNEE;  Surgeon: Earnestine Leys, MD;  Location: [facility] ORS;  Service: Orthopedics;  Laterality: Left;   JOINT REPLACEMENT Right    TKR   KNEE ARTHROSCOPY Right 06/25/2014   Procedure: ARTHROSCOPY KNEE;  Surgeon: Earnestine Leys, MD;  Location: [facility] ORS;  Service: Orthopedics;  Laterality: Right;  partial arthroscopic medial menisectomy   TOTAL KNEE ARTHROPLASTY Right 04/22/2015   Procedure: TOTAL KNEE  ARTHROPLASTY;  Surgeon: Earnestine Leys, MD;  Location: [facility] ORS;  Service: Orthopedics;  Laterality: Right;   TOTAL KNEE ARTHROPLASTY Left 10/30/2017   Procedure: TOTAL KNEE ARTHROPLASTY;  Surgeon: Earnestine Leys, MD;  Location: [facility] ORS;  Service: Orthopedics;  Laterality: Left;   TOTAL KNEE REVISION Left 01/02/2018   Procedure: poly exchange of tibia and patella left knee;  Surgeon: Earnestine Leys, MD;  Location: [facility] ORS;  Service: Orthopedics;  Laterality: Left;   Social History   Socioeconomic History   Marital status: Single    Spouse  name: Not on file   Number of children: Not on file   Years of education: Not on file   Highest education level: Not on file  Occupational History   Not on file  Tobacco Use   Smoking status: Former    Packs/day: 0.75    Years: 20.00    Pack years: 15.00    Types: Cigarettes    Quit date: 05/16/1984    Years since quitting: 37.0   Smokeless tobacco: Never  Vaping Use   Vaping Use: Never used  Substance and Sexual Activity   Alcohol use: Yes   Drug use: Yes    Types: Marijuana   Sexual activity: Not on file  Other Topics Concern   Not on file  Social History Narrative   Not on file   Social Determinants of Health   Financial Resource Strain: Not on file  Food Insecurity: Not on file  Transportation Needs: Not on file  Physical Activity: Not on file  Stress: Not on file  Social Connections: Not on file   Family History  Problem Relation Age of Onset   CVA Mother        deceased at age 65   Depression Brother        Died by suicide at age 31   No Active Allergies Prior to Admission medications   Medication Sig Start Date End Date Taking? Authorizing Provider  doxepin (SINEQUAN) 100 MG capsule Take 1 capsule (100 mg total) by mouth at bedtime. 05/12/21  Yes Parks Ranger, DO  FLUoxetine (PROZAC) 40 MG capsule Take 40 mg by mouth 2 (two) times daily. 05/24/21  Yes [provider]  gabapentin (NEURONTIN) 400 MG capsule Take 1 capsule (400 mg total) by mouth 3 (three) times daily. 05/12/21  Yes Parks Ranger, DO  traZODone (DESYREL) 50 MG tablet Take 0.5 tablets (25 mg total) by mouth 3 (three) times daily as needed (Anxiety). 05/12/21  Yes Parks Ranger, DO  cephALEXin (KEFLEX) 500 MG capsule Take 1 capsule (500 mg total) by mouth 4 (four) times daily for 10 days. 05/27/21 06/06/21  Teodoro Spray, PA  enalapril (VASOTEC) 10 MG tablet Take 1 tablet (10 mg total) by mouth daily. 05/28/21 06/27/21  Johnn Hai, PA-C  FLUoxetine  (PROZAC) 20 MG capsule Take 3 capsules (60 mg total) by mouth daily. Patient not taking: Reported on 05/29/2021 05/13/21   Parks Ranger, DO  lithium carbonate (LITHOBID) 300 MG CR tablet Take 300 mg by mouth 2 (two) times daily. Patient not taking: Reported on 05/29/2021 05/22/21   [provider]  naltrexone (DEPADE) 50 MG tablet Take 1 tablet (50 mg total) by mouth daily. Patient not taking: Reported on 05/15/2021 01/28/21   Clapacs, Madie Reno, MD  naproxen (NAPROSYN) 500 MG tablet Take 1 tablet (500 mg total) by mouth 2 (two) times daily with a meal. 05/27/21 06/26/21  Teodoro Spray, PA  OLANZapine (  ZYPREXA) 10 MG tablet Take 1 tablet (10 mg total) by mouth every evening. Patient not taking: Reported on 05/29/2021 05/12/21   Parks Ranger, DO  OLANZapine (ZYPREXA) 5 MG tablet Take 1 tablet (5 mg total) by mouth every morning. Patient not taking: Reported on 05/29/2021 05/13/21   Parks Ranger, DO  pantoprazole (PROTONIX) 40 MG tablet Take 1 tablet (40 mg total) by mouth daily. 01/28/21 05/15/21  Clapacs, Madie Reno, MD  sulfamethoxazole-trimethoprim (BACTRIM DS) 800-160 MG tablet Take 1 tablet by mouth 2 (two) times daily for 10 days. 05/27/21 06/06/21  Teodoro Spray, PA   CT HAND RIGHT W CONTRAST  Result Date: 05/29/2021 CLINICAL DATA:  Right hand swelling. Soft tissue infection suspected, hand, xray done EXAM: CT OF THE UPPER RIGHT EXTREMITY WITH CONTRAST TECHNIQUE: Multidetector CT imaging of the upper right extremity was performed according to the standard protocol following intravenous contrast administration. RADIATION DOSE REDUCTION: This exam was performed according to the departmental dose-optimization program which includes automated exposure control, adjustment of the mA and/or kV according to patient size and/or use of iterative reconstruction technique. CONTRAST:  15mL OMNIPAQUE IOHEXOL 300 MG/ML  SOLN COMPARISON:  X-ray 05/28/2021 FINDINGS:  Bones/Joint/Cartilage No acute fracture. No dislocation. Mild degenerative changes of the hand, most pronounced at the first MCP joint. No erosion or periosteal elevation. No large joint effusion is evident by CT. Ligaments Suboptimally assessed by CT. Muscles and Tendons Large volume of focal tenosynovitis involving the long finger extensor tendon centered at the third MCP joint measuring up to 3.0 x 0.8 x 1.9 cm (series 3, images 39-52). No additional tenosynovial fluid collections are evident. No intramuscular fluid collection identified. Soft tissues Diffuse soft tissue swelling with ill-defined fluid over the dorsum of the hand. No organized or rim enhancing fluid collection within the soft tissues. IMPRESSION: 1. Large volume of focal tenosynovitis involving the long finger extensor tendon centered at the third MCP joint measuring up to 3.0 x 0.8 x 1.9 cm. Findings concerning for septic tenosynovitis given the history. 2. Diffuse soft tissue swelling with ill-defined fluid over the dorsum of the hand, suggestive of cellulitis. No organized or rim enhancing fluid collection within the soft tissues. 3. No evidence of osteomyelitis. Electronically Signed   By: Davina Poke D.O.   On: 05/29/2021 17:13   CT KNEE LEFT W CONTRAST  Result Date: 05/29/2021 CLINICAL DATA:  Left knee swelling for 3 days EXAM: CT OF THE LEFT KNEE WITH CONTRAST TECHNIQUE: Multidetector CT imaging was performed following the standard protocol during bolus administration of intravenous contrast. RADIATION DOSE REDUCTION: This exam was performed according to the departmental dose-optimization program which includes automated exposure control, adjustment of the mA and/or kV according to patient size and/or use of iterative reconstruction technique. CONTRAST:  111mL OMNIPAQUE IOHEXOL 300 MG/ML  SOLN COMPARISON:  X-ray 05/27/2021, CT 12/31/2017 FINDINGS: Bones/Joint/Cartilage Postsurgical changes from left total knee arthroplasty.  Metallic streak artifact from hardware slightly degrades evaluation of the adjacent structures. Within this limitation, there is no evidence of periprosthetic fracture or loosening. Remaining osseous structures appear within normal limits. No evidence of erosion or periosteal elevation. Large knee joint effusion with mild diffuse synovial thickening. Ligaments Suboptimally assessed by CT. Muscles and Tendons Musculotendinous structures appear within normal limits by CT. Soft tissues No significant periarticular soft tissue findings. IMPRESSION: 1. Large knee joint effusion with mild diffuse synovial thickening, nonspecific in the postoperative setting. If there is clinical suspicion for infection, arthrocentesis should be performed to assess  for septic arthritis. 2. 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 Electronically Signed   By: Davina Poke D.O.   On: 05/29/2021 17:03   US Venous Img Lower Unilateral Left  Result Date: 05/29/2021 CLINICAL DATA:  Swelling and pain for 2 weeks EXAM: LEFT LOWER EXTREMITY VENOUS DOPPLER ULTRASOUND TECHNIQUE: Gray-scale sonography with compression, as well as color and duplex ultrasound, were performed to evaluate the deep venous system(s) from the level of the common femoral vein through the popliteal and proximal calf veins. COMPARISON:  None FINDINGS: VENOUS Normal compressibility of the common femoral, superficial femoral, and popliteal veins, as well as the visualized calf veins. Visualized portions of profunda femoral vein and great saphenous vein unremarkable. No filling defects to suggest DVT on grayscale or color Doppler imaging. Doppler waveforms show normal direction of venous flow, normal respiratory plasticity and response to augmentation. Limited views of the contralateral common femoral vein are unremarkable. OTHER None. Limitations: none IMPRESSION: 1. No left lower extremity DVT 2.  Nonspecific mildly enlarged left inguinal lymph node. Follow-up ultrasound should be performed in 4-6 weeks to document resolution. Electronically Signed   By: Miachel Roux M.D.   On: 05/29/2021 18:17    Positive ROS: All other systems have been reviewed and were otherwise negative with the exception of those mentioned in the HPI and as above.  Physical Exam: General: Alert, no acute distress Cardiovascular: No pedal edema Respiratory: No cyanosis, no use of accessory musculature GI: No organomegaly, abdomen is soft and non-tender Skin: No lesions in the area of chief complaint Neurologic: Sensation intact distally Psychiatric: Patient is competent for consent with normal mood and affect Lymphatic: No axillary or cervical lymphadenopathy  MUSCULOSKELETAL: Patient is alert cooperative and oriented.  He is lying quietly in bed.  The left knee shows an effusion.  Wound is well-healed.  There is no tenderness and no redness or evidence cellulitis.  He has good motion compared and equal to the other knee.  Neurovascular status good distally.  Exam of the right hand shows the right some redness and tenosynovitis dorsally.  There is a small pocket small pocket of pus dorsally over the MP joint.  Is got good range of motion of the fingers otherwise minimal pain.  Neurovascular status is normal.  Assessment: Tenosynovitis right hand with small abscess Cold effusion left knee with negative cultures.  This is probably mechanical in nature from recent lifting and bending he had been doing.  Plan: Continue IV antibiotics Moist heat to the right hand Ice and Ace wrap to the left knee I will reevaluate tomorrow to see if he needs a bedside I&D of the hand.  At this time there does not appear to be an active infection of the left knee joint given lack of redness, pain, relatively low white count on aspiration, and negative cultures.    Park Breed, MD [phone]   05/30/2021 11:38 AM

## 2021-05-30 NOTE — Hospital Course (Signed)
62 y.o. male with PMHx of bipolar disorder, anxiety, GERD, hypertension, urolithiasis and PTSD, who presented to the ER on 05/29/2021 with worsening right hand swelling and pain that started swelling a couple weeks ago after having a piece of glass on his third MCP.  He also reported that he had started having swelling in his left knee 4-5 days prior.  Noted hx of where he had his left TKA in 2014 that was complicated then by infection with septic arthritis and required washout procedure.  [place] seen in the ER 3 days prior to this presentation for his left knee pain and was discharged on Keflex and Bactrim after negative work-up.  Imaging: Right hand x-ray yesterday revealed dorsal soft tissue swelling that may be inflammatory with no fracture or dislocation.  4 view right knee x-ray on 5/18 revealed no acute abnormality and well situated left TKA components.. Right hand CT with contrast revealed the following: 1. Large volume of focal tenosynovitis involving the long finger extensor tendon centered at the third MCP joint measuring up to 3.0 x 0.8 x 1.9 cm. Findings concerning for septic tenosynovitis given the history. 2. Diffuse soft tissue swelling with ill-defined fluid over the dorsum of the hand, suggestive of cellulitis. No organized or rim enhancing fluid collection within the soft tissues. 3. No evidence of osteomyelitis.  Left knee CT with contrast revealed the following: 1. Large knee joint effusion with mild diffuse synovial thickening, nonspecific in the postoperative setting. If there is clinical suspicion for infection, arthrocentesis should be performed to assess for septic arthritis. 2. 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  Orthopedic surgery was consulted who recommended admission for IV antibiotics and close monitoring for both the left and a septic arthritis and right hand cellulitis with associated third  MCP tenosynovitis.

## 2021-05-30 NOTE — Progress Notes (Signed)
Pharmacy Antibiotic Note  [name] is a 61 y.o. male admitted on 05/29/2021 with cellulitis.  Pharmacy has been consulted for Cefepime and Vancomycin dosing x 7 days.  Plan: Cefepime 2 gm q8h per indication & renal fxn.  Pt given Vancomycin 1 gm 5/20 @ 1657 Order addition Vancomycin 1250 now followed by vancomycin 1750 mg IV Q 24 hrs x 6 days Goal AUC 400-550. Expected AUC: 475.4 SCr used: 1.1  Pharmacy will continue to follow and will adjust abx dosing whenever warranted.   Height: 5\' 8"  (172.7 cm) Weight: 81.6 kg (180 lb) IBW/kg (Calculated) : 68.4  Temp (24hrs), Avg:98 F (36.7 C), Min:97.7 F (36.5 C), Max:98.6 F (37 C)  Recent Labs  Lab 05/27/21 1312 05/29/21 1255 05/29/21 1500  WBC 5.8 6.1  --   CREATININE 1.04 1.11  --   LATICACIDVEN  --  2.2* 1.3    Estimated Creatinine Clearance: 70.2 mL/min (by C-G formula based on SCr of 1.11 mg/dL).    No Active Allergies  Antimicrobials this admission: 5/20 Ceftriaxone >> x 1 dose 5/20 Vancomycin >> x 7 days 5/21 Cefepime >> x 7 days  Microbiology results: 2/20 BCx: Pending  Thank you for allowing pharmacy to be a part of this patient's care.  3/20, PharmD, [facility] 05/30/2021 1:47 AM

## 2021-05-30 NOTE — Progress Notes (Signed)
Progress Note   Patient: [name] KVQ:[id number] DOB: [birth year] DOA: 05/29/2021     1 DOS: the patient was seen and examined on 05/30/2021   Brief hospital course: 61 y.o. male with PMHx of bipolar disorder, anxiety, GERD, hypertension, urolithiasis and PTSD, who presented to the ER on 05/29/2021 with worsening right hand swelling and pain that started swelling a couple weeks ago after having a piece of glass on his third MCP.  He also reported that he had started having swelling in his left knee 4-5 days prior.  Noted hx of where he had his left TKA in 2014 that was complicated then by infection with septic arthritis and required washout procedure.  He [place] seen in the ER 3 days prior to this presentation for his left knee pain and was discharged on Keflex and Bactrim after negative work-up.  Imaging: Right hand x-ray yesterday revealed dorsal soft tissue swelling that may be inflammatory with no fracture or dislocation.  4 view right knee x-ray on 5/18 revealed no acute abnormality and well situated left TKA components.. Right hand CT with contrast revealed the following: 1. Large volume of focal tenosynovitis involving the long finger extensor tendon centered at the third MCP joint measuring up to 3.0 x 0.8 x 1.9 cm. Findings concerning for septic tenosynovitis given the history. 2. Diffuse soft tissue swelling with ill-defined fluid over the dorsum of the hand, suggestive of cellulitis. No organized or rim enhancing fluid collection within the soft tissues. 3. No evidence of osteomyelitis.  Left knee CT with contrast revealed the following: 1. Large knee joint effusion with mild diffuse synovial thickening, nonspecific in the postoperative setting. If there is clinical suspicion for infection, arthrocentesis should be performed to assess for septic arthritis. 2. 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  Orthopedic surgery was consulted who recommended admission for IV antibiotics and close monitoring for both the left and a septic arthritis and right hand cellulitis with associated third MCP tenosynovitis.    Assessment and Plan: * Cellulitis of right hand Presented with pain redness and swelling of the right hand primarily at the third MCP joint after having a piece of glass in that area several days prior to presenting. CT of the right hand concerning for right third finger extensor tendon tenosynovitis that could be septic. --Orthopedic surgery is following recommended conservative management here with IV antibiotics and close monitoring --Continue IV Vanc, cefepime -- Warm compresses -- Pain management -- Elevate the right upper extremity  Effusion of bursa of left knee Present on admission, concern for early septic arthritis.  Had been started on oral antibiotics after being seen in the ED 3 days prior to this presentation but had progressive worsening despite this. --Continue IV Vanc, cefepime - Pain management will be provided. - Orthopedic surgery, Dr. Hyacinth Meeker is following  Essential hypertension Continue enalapril  Bipolar disorder (HCC) Appears stable at this time. Formerly on lithium and reported on admission that is not available to him. -- follow-up with psychiatry  Depression Continue Prozac, trazodone, doxepin        Subjective: Patient awake sitting up in bed when seen on rounds today.  He reports the pain and swelling in his hand is slowly improving.  Knee also feels a bit better but remains quite swollen compared to the other side.  Denies fevers chills.  Pain control is fairly adequate.  No other acute complaints at this time  Physical Exam: Vitals:  05/29/21 2032 05/30/21 0422 05/30/21 0731 05/30/21 1213  BP: 113/89 108/71 110/67 (!) 111/59  Pulse: 81 86 80 76  Resp: 18 17 18 17   Temp: 97.7 F (36.5 C) (!) 97.1 F (36.2 C) (!) 97.5 F  (36.4 C) (!) 97.5 F (36.4 C)  TempSrc: Oral Axillary  Oral  SpO2: 99% 96% 98% 97%  Weight:      Height:       General exam: awake, alert, no acute distress HEENT: moist mucus membranes, hearing grossly normal  Respiratory system: CTAB, no wheezes, rales or rhonchi, normal respiratory effort. Cardiovascular system: normal S1/S2, RRR, no pedal edema.   Central nervous system: A&O x4. no gross focal neurologic deficits, normal speech Extremities: Dorsal aspect of the right hand with swelling and erythema most prominent over the third MCP joint with erythema that appears to be receding, left knee without warmth or erythema but significant swelling, bilateral knee incisions noted Skin: dry, intact, normal temperature Psychiatry: normal mood, congruent affect, judgement and insight appear normal   Data Reviewed:  Notable labs: Calcium 8.8, CRP 4.5, hemoglobin 11.2.  UA negative for infection.  Negative chlamydia and gonorrhea urine screen  Family Communication: None  Disposition: Status is: Inpatient Remains inpatient appropriate because: Remains on empiric IV antibiotics for right hand cellulitis and probable left knee septic arthritis   Planned Discharge Destination: Home    Time spent: 35 minutes  Author: , DO 05/30/2021 4:02 PM  For on call review [email or web address].

## 2021-05-30 NOTE — Progress Notes (Signed)
Pharmacy Antibiotic Note  [name] is a 61 y.o. male admitted on 05/29/2021 with cellulitis.  Pharmacy has been consulted for Cefepime and Vancomycin dosing x 7 days.  Plan: Will continue cefepime 2 gm q8h   Pt given Vancomycin 1 gm 5/20 @ 1657 and another 1250 mg x 1 5/21 @ @0206 . Will adjust vancomycin to 1000 mg BID for a predicted AUC of 502. Goal AUC of 400-550. Plan to check vancomycin level after 4th or 5th dose.     Height: 5\' 8"  (172.7 cm) Weight: 81.6 kg (180 lb) IBW/kg (Calculated) : 68.4  Temp (24hrs), Avg:97.8 F (36.6 C), Min:97.1 F (36.2 C), Max:98.6 F (37 C)  Recent Labs  Lab 05/27/21 1312 05/29/21 1255 05/29/21 1500 05/30/21 0512  WBC 5.8 6.1  --  5.3  CREATININE 1.04 1.11  --  1.02  LATICACIDVEN  --  2.2* 1.3  --      Estimated Creatinine Clearance: 76.4 mL/min (by C-G formula based on SCr of 1.02 mg/dL).    No Active Allergies  Antimicrobials this admission: 5/20 Ceftriaxone >> x 1 dose 5/20 Vancomycin >> x 7 days 5/21 Cefepime >> x 7 days  Microbiology results: 2/20 BCx: Pending  Thank you for allowing pharmacy to be a part of this patient's care.  6/21, PharmD, 05/30/2021 7:33 AM

## 2021-05-31 DIAGNOSIS — L03113 Cellulitis of right upper limb: Secondary | ICD-10-CM | POA: Diagnosis not present

## 2021-05-31 LAB — BASIC METABOLIC PANEL
Anion gap: 7 (ref 5–15)
BUN: 16 mg/dL (ref 6–20)
CO2: 24 mmol/L (ref 22–32)
Calcium: 8.7 mg/dL — ABNORMAL LOW (ref 8.9–10.3)
Chloride: 109 mmol/L (ref 98–111)
Creatinine, Ser: 1 mg/dL (ref 0.61–1.24)
GFR, Estimated: >60 mL/min (ref >60)
Glucose, Bld: 85 mg/dL (ref 70–99)
Potassium: 4.4 mmol/L (ref 3.5–5.1)
Sodium: 140 mmol/L (ref 135–145)

## 2021-05-31 LAB — BODY FLUID CULTURE W GRAM STAIN
Culture: NO GROWTH
Gram Stain: NONE SEEN
Special Requests: NORMAL

## 2021-05-31 NOTE — Progress Notes (Signed)
Progress Note   Patient: [name] LAG:[id number] DOB: [birth year] DOA: 05/29/2021     2 DOS: the patient was seen and examined on 05/31/2021   Brief hospital course: 61 y.o. male with PMHx of bipolar disorder, anxiety, GERD, hypertension, urolithiasis and PTSD, who presented to the ER on 05/29/2021 with worsening right hand swelling and pain that started swelling a couple weeks ago after having a piece of glass on his third MCP.  He also reported that he had started having swelling in his left knee 4-5 days prior.  Noted hx of where he had his left TKA in 2014 that was complicated then by infection with septic arthritis and required washout procedure.  [place] seen in the ER 3 days prior to this presentation for his left knee pain and was discharged on Keflex and Bactrim after negative work-up.  Imaging: Right hand x-ray yesterday revealed dorsal soft tissue swelling that may be inflammatory with no fracture or dislocation.  4 view right knee x-ray on 5/18 revealed no acute abnormality and well situated left TKA components.. Right hand CT with contrast revealed the following: 1. Large volume of focal tenosynovitis involving the long finger extensor tendon centered at the third MCP joint measuring up to 3.0 x 0.8 x 1.9 cm. Findings concerning for septic tenosynovitis given the history. 2. Diffuse soft tissue swelling with ill-defined fluid over the dorsum of the hand, suggestive of cellulitis. No organized or rim enhancing fluid collection within the soft tissues. 3. No evidence of osteomyelitis.  Left knee CT with contrast revealed the following: 1. Large knee joint effusion with mild diffuse synovial thickening, nonspecific in the postoperative setting. If there is clinical suspicion for infection, arthrocentesis should be performed to assess for septic arthritis. 2. 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  Orthopedic surgery was consulted who recommended admission for IV antibiotics and close monitoring for both the left and a septic arthritis and right hand cellulitis with associated third MCP tenosynovitis.    Assessment and Plan: * Cellulitis of right hand Presented with pain redness and swelling of the right hand primarily at the third MCP joint after having a piece of glass in that area several days prior to presenting. CT of the right hand concerning for right third finger extensor tendon tenosynovitis that could be septic. --Orthopedic surgery is following recommended conservative management here with IV antibiotics and close monitoring --Continue IV Vanc, cefepime --Hopefully transition to p.o. antibiotics as soon as tomorrow -- Warm compresses -- Pain management -- Elevate the right upper extremity -- Follow culture  5/22: Ortho performed bedside I&D and sent cultures  Effusion of bursa of left knee Present on admission, concern for early septic arthritis.  Had been started on oral antibiotics after being seen in the ED 3 days prior to this presentation but had progressive worsening despite this. --Continue IV Vanc, cefepime - Pain management will be provided. - Orthopedic surgery, Dr. Hyacinth Meeker is following  Essential hypertension Continue enalapril  Bipolar disorder (HCC) Appears stable at this time. Formerly on lithium and reported on admission that is not available to him. -- follow-up with psychiatry  Depression Continue Prozac, trazodone, doxepin        Subjective: Patient awake sitting up in bed when seen on rounds today.  He reports the left knee is feeling quite a lot better today.  Denies fevers chills.  He has better range of motion in the right hand but it  does remain swollen.  The redness seems to be fading a little bit.  He denies any other acute complaints  Physical Exam: Vitals:   05/30/21 2005 05/30/21 2323 05/31/21 0836 05/31/21 1224   BP: 119/78 132/82 (!) 132/97 124/86  Pulse: 76 85 97 81  Resp: 17 17 18 18   Temp: 97.6 F (36.4 C) 97.7 F (36.5 C) 98.2 F (36.8 C) 97.7 F (36.5 C)  TempSrc:   Oral Oral  SpO2: 100% 95% 97% 96%  Weight:      Height:       General exam: awake, alert, no acute distress HEENT: moist mucus membranes, hearing grossly normal  Respiratory system: On room air, normal respiratory effort. Cardiovascular system: Brisk cap refill, no pedal edema.   Central nervous system: A&O x4. no gross focal neurologic deficits, normal speech Extremities: Dorsal right hand swelling appears stable to mildly improved, the third MCP joint remains erythematous although it appears to be fading somewhat no visible drainage.  Left knee with Ace bandage intact Psychiatry: normal mood, congruent affect, judgement and insight appear normal   Data Reviewed:  Notable labs: Calcium 8.7  Family Communication: None  Disposition: Status is: Inpatient Remains inpatient appropriate because: Remains on empiric IV antibiotics for right hand cellulitis and probable left knee septic arthritis   Planned Discharge Destination: Home    Time spent: 35 minutes  Author: , DO 05/31/2021 3:37 PM  For on call review [email or web address].

## 2021-05-31 NOTE — Progress Notes (Signed)
Refused warm compression on the right hand and ice pack to the left knee.

## 2021-05-31 NOTE — Plan of Care (Signed)
  Problem: Skin Integrity: Goal: Risk for impaired skin integrity will decrease Outcome: Progressing   Problem: Pain Managment: Goal: General experience of comfort will improve Outcome: Progressing   Problem: Elimination: Goal: Will not experience complications related to bowel motility Outcome: Progressing Goal: Will not experience complications related to urinary retention Outcome: Progressing

## 2021-05-31 NOTE — Plan of Care (Signed)
  Problem: Education: Goal: Knowledge of General Education information will improve Description: Including pain rating scale, medication(s)/side effects and non-pharmacologic comfort measures Outcome: Progressing   Problem: Clinical Measurements: Goal: Ability to maintain clinical measurements within normal limits will improve Outcome: Progressing Goal: Will remain free from infection Outcome: Progressing Goal: Diagnostic test results will improve Outcome: Progressing Goal: Respiratory complications will improve Outcome: Progressing Goal: Cardiovascular complication will be avoided Outcome: Progressing   Problem: Skin Integrity: Goal: Skin integrity will improve Outcome: Progressing   Problem: Pain Managment: Goal: General experience of comfort will improve Outcome: Progressing

## 2021-05-31 NOTE — Progress Notes (Signed)
Subjective:    The patient is alert oriented and cooperative.  Says the left knee feels better with less swelling.  The right hand is a little less swollen and red he says.  His blood work remains normal.  He is afebrile.  Exam the dorsum of the right hand shows less redness and cellulitis.  The small abscess over the third MP joint is a little smaller.  There is no drainage.  He has full motion of the fingers.  Recommendation at the bedside a prepped the small abscess over the right third MP level sterilely with Betadine and infiltrated with Xylocaine.  Then incised with a small area and expressed a small amount of pus and blood.  Cultures were taken.  Fluid all the fluid was expressed.  The wound was then redressed sterilely.  Cultures will be sent to the lab. Continue IV antibiotics.  Should be able to start p.o. tomorrow.  Patient reports pain as mild.  Objective:   VITALS:   Vitals:   05/31/21 0836 05/31/21 1224  BP: (!) 132/97 124/86  Pulse: 97 81  Resp: 18 18  Temp: 98.2 F (36.8 C) 97.7 F (36.5 C)  SpO2: 97% 96%    Neurologically intact Less redness over the back of the hand.  Small abscess over the third MP region dorsally.  Range of motion of fingers is normal.  LABS Recent Labs    05/29/21 1255 05/30/21 0512  HGB 12.6* 11.2*  HCT 39.4 34.1*  WBC 6.1 5.3  PLT 261 230    Recent Labs    05/29/21 1255 05/30/21 0512 05/31/21 1038  NA 138 138 140  K 4.0 4.0 4.4  BUN 14 14 16   CREATININE 1.11 1.02 1.00  GLUCOSE 137* 98 85    No results for input(s): LABPT, INR in the last 72 hours.   Assessment/Plan:    Continue IV antibiotics overnight. Start p.o. antibiotics tomorrow.  Follow-up in my office in 3 to 5 days after discharge.  We will start dressing changes and warm soaks again tomorrow.

## 2021-06-01 DIAGNOSIS — L03113 Cellulitis of right upper limb: Secondary | ICD-10-CM | POA: Diagnosis not present

## 2021-06-01 MED ORDER — OLANZAPINE 5 MG PO TABS
5.0000 mg | ORAL_TABLET | Freq: Every day | ORAL | Status: DC
  Administered 2021-06-02 – 2021-06-09 (×8): 5 mg via ORAL
  Filled 2021-06-01 (×8): qty 1

## 2021-06-01 MED ORDER — NALTREXONE HCL 50 MG PO TABS
50.0000 mg | ORAL_TABLET | Freq: Every day | ORAL | Status: DC
  Administered 2021-06-02 – 2021-06-09 (×8): 50 mg via ORAL
  Filled 2021-06-01 (×8): qty 1

## 2021-06-01 MED ORDER — FLUOXETINE HCL 20 MG PO CAPS
60.0000 mg | ORAL_CAPSULE | Freq: Every day | ORAL | Status: DC
  Administered 2021-06-02 – 2021-06-09 (×8): 60 mg via ORAL
  Filled 2021-06-01 (×8): qty 3

## 2021-06-01 MED ORDER — OLANZAPINE 5 MG PO TABS
10.0000 mg | ORAL_TABLET | Freq: Every day | ORAL | Status: DC
Start: 2021-06-01 — End: 2021-06-09
  Administered 2021-06-01 – 2021-06-08 (×8): 10 mg via ORAL
  Filled 2021-06-01 (×8): qty 2

## 2021-06-01 NOTE — Progress Notes (Signed)
Progress Note   Patient: [name] STM:[id number] DOB: November 18, 1960 DOA: 05/29/2021     3 DOS: the patient was seen and examined on 06/01/2021   Brief hospital course: 61 y.o. male with PMHx of bipolar disorder, anxiety, GERD, hypertension, urolithiasis and PTSD, who presented to the ER on 05/29/2021 with worsening right hand swelling and pain that started swelling a couple weeks ago after having a piece of glass on his third MCP.  He also reported that he had started having swelling in his left knee 4-5 days prior.  Noted hx of where he had his left TKA in 2014 that was complicated then by infection with septic arthritis and required washout procedure.  [place] seen in the ER 3 days prior to this presentation for his left knee pain and was discharged on Keflex and Bactrim after negative work-up.  Imaging: Right hand x-ray yesterday revealed dorsal soft tissue swelling that may be inflammatory with no fracture or dislocation.  4 view right knee x-ray on 5/18 revealed no acute abnormality and well situated left TKA components.. Right hand CT with contrast revealed the following: 1. Large volume of focal tenosynovitis involving the long finger extensor tendon centered at the third MCP joint measuring up to 3.0 x 0.8 x 1.9 cm. Findings concerning for septic tenosynovitis given the history. 2. Diffuse soft tissue swelling with ill-defined fluid over the dorsum of the hand, suggestive of cellulitis. No organized or rim enhancing fluid collection within the soft tissues. 3. No evidence of osteomyelitis.  Left knee CT with contrast revealed the following: 1. Large knee joint effusion with mild diffuse synovial thickening, nonspecific in the postoperative setting. If there is clinical suspicion for infection, arthrocentesis should be performed to assess for septic arthritis. 2. 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  Orthopedic surgery was consulted who recommended admission for IV antibiotics and close monitoring for both the left and a septic arthritis and right hand cellulitis with associated third MCP tenosynovitis.    Assessment and Plan: * Cellulitis of right hand Presented with pain redness and swelling of the right hand primarily at the third MCP joint after having a piece of glass in that area several days prior to presenting. CT of the right hand concerning for right third finger extensor tendon tenosynovitis that could be septic. --Orthopedic surgery is following recommended conservative management here with IV antibiotics and close monitoring --Continue IV Vanc, cefepime --Hopefully transition to p.o. antibiotics as soon as tomorrow -- Warm compresses -- Pain management -- Elevate the right upper extremity -- Follow culture  5/22: Ortho performed bedside I&D and sent cultures  Effusion of bursa of left knee Present on admission, concern for early septic arthritis.  Had been started on oral antibiotics after being seen in the ED 3 days prior to this presentation but had progressive worsening despite this. --Continue IV Vanc, cefepime - Pain management will be provided. - Orthopedic surgery, Dr. Hyacinth Meeker is following  Essential hypertension Continue enalapril  Bipolar disorder (HCC) Appears stable at this time. Formerly on lithium and reported on admission that is not available to him. -- follow-up with psychiatry  Depression Continue Prozac, trazodone, doxepin        Subjective: Patient awake sitting up in bed when seen on rounds today.  He reports ongoing pain in the right hand but otherwise feeling better.  Shows good range of motion in the right fingers.  Says his left knee is feeling  better and denies any fevers chills.  Physical Exam: Vitals:   05/31/21 1658 05/31/21 2127 06/01/21 0423 06/01/21 0942  BP: (!) 133/93 (!) 153/97 (!) 135/98 135/89  Pulse: 82  82 82 81  Resp:  17  16  Temp: 98.1 F (36.7 C) 97.6 F (36.4 C) 97.6 F (36.4 C) 97.8 F (36.6 C)  TempSrc: Oral   Oral  SpO2: 96% 97% 97% 97%  Weight:      Height:       General exam: awake, alert, no acute distress HEENT: moist mucus membranes, hearing grossly normal  Respiratory system: On room air, normal respiratory effort. Cardiovascular system: Brisk cap refill, no pedal edema.   Central nervous system: A&O x4. no gross focal neurologic deficits, normal speech Extremities: Right hand in clean dry intact gauze dressing with good range of motion of the fingers.  Left knee with Ace bandage intact Psychiatry: normal mood, congruent affect, judgement and insight appear normal   Data Reviewed: No new labs to review today  Family Communication: None  Disposition: Status is: Inpatient Remains inpatient appropriate because: Remains on empiric IV antibiotics for right hand cellulitis and probable left knee septic arthritis.  Anticipate discharge home pending further clinical improvement and clearance by orthopedic surgery, transition to oral antibiotics.  Awaiting cultures   Planned Discharge Destination: Home    Time spent: 35 minutes  Author: Pennie Banter, DO 06/01/2021 1:56 PM  For on call review [email or web address].

## 2021-06-01 NOTE — Plan of Care (Signed)
  Problem: Clinical Measurements: Goal: Ability to avoid or minimize complications of infection will improve Outcome: Progressing   Problem: Skin Integrity: Goal: Skin integrity will improve Outcome: Progressing   Problem: Education: Goal: Knowledge of General Education information will improve Description: Including pain rating scale, medication(s)/side effects and non-pharmacologic comfort measures Outcome: Progressing   

## 2021-06-02 ENCOUNTER — Inpatient Hospital Stay: Payer: Medicaid Other | Admitting: Anesthesiology

## 2021-06-02 ENCOUNTER — Encounter
Admission: EM | Disposition: A | Discharge status: Home / Self Care | Payer: Self-pay | Source: Home / Self Care | Attending: Internal Medicine

## 2021-06-02 ENCOUNTER — Inpatient Hospital Stay: Payer: Medicaid Other

## 2021-06-02 ENCOUNTER — Encounter: Payer: Self-pay | Admitting: Family Medicine

## 2021-06-02 DIAGNOSIS — I10 Essential (primary) hypertension: Secondary | ICD-10-CM | POA: Diagnosis not present

## 2021-06-02 DIAGNOSIS — K353 Acute appendicitis with localized peritonitis, without perforation or gangrene: Secondary | ICD-10-CM | POA: Diagnosis not present

## 2021-06-02 DIAGNOSIS — K3580 Unspecified acute appendicitis: Secondary | ICD-10-CM | POA: Clinically undetermined

## 2021-06-02 DIAGNOSIS — L03113 Cellulitis of right upper limb: Secondary | ICD-10-CM | POA: Diagnosis not present

## 2021-06-02 DIAGNOSIS — M25462 Effusion, left knee: Secondary | ICD-10-CM | POA: Diagnosis not present

## 2021-06-02 HISTORY — PX: UMBILICAL HERNIA REPAIR: SHX196

## 2021-06-02 HISTORY — PX: LAPAROSCOPIC APPENDECTOMY: SHX408

## 2021-06-02 LAB — CREATININE, SERUM
Creatinine, Ser: 0.93 mg/dL (ref 0.61–1.24)
GFR, Estimated: >60 mL/min (ref >60)

## 2021-06-02 SURGERY — APPENDECTOMY, LAPAROSCOPIC
Anesthesia: General | Site: Abdomen

## 2021-06-02 MED ORDER — MIDAZOLAM HCL 2 MG/2ML IJ SOLN
INTRAMUSCULAR | Status: AC
  Filled 2021-06-02: qty 2

## 2021-06-02 MED ORDER — HYDROMORPHONE HCL 1 MG/ML IJ SOLN
0.5000 mg | INTRAMUSCULAR | Status: AC
  Administered 2021-06-02: 0.5 mg via INTRAVENOUS
  Filled 2021-06-02: qty 1

## 2021-06-02 MED ORDER — BUPIVACAINE LIPOSOME 1.3 % IJ SUSP
INTRAMUSCULAR | Status: AC
  Filled 2021-06-02: qty 20

## 2021-06-02 MED ORDER — SODIUM CHLORIDE 0.9 % IV SOLN
2.0000 g | INTRAVENOUS | Status: DC
  Administered 2021-06-02: 2 g via INTRAVENOUS
  Filled 2021-06-02: qty 2

## 2021-06-02 MED ORDER — METRONIDAZOLE 500 MG/100ML IV SOLN
500.0000 mg | Freq: Two times a day (BID) | INTRAVENOUS | Status: DC
  Administered 2021-06-02 – 2021-06-03 (×2): 500 mg via INTRAVENOUS
  Filled 2021-06-02 (×2): qty 100

## 2021-06-02 MED ORDER — POLYETHYLENE GLYCOL 3350 17 G PO PACK
17.0000 g | PACK | Freq: Every day | ORAL | Status: DC
Start: 2021-06-02 — End: 2021-06-09
  Administered 2021-06-02 – 2021-06-06 (×5): 17 g via ORAL
  Filled 2021-06-02 (×8): qty 1

## 2021-06-02 MED ORDER — FENTANYL CITRATE (PF) 100 MCG/2ML IJ SOLN
INTRAMUSCULAR | Status: AC
  Filled 2021-06-02: qty 2

## 2021-06-02 MED ORDER — BUPIVACAINE-EPINEPHRINE (PF) 0.25% -1:200000 IJ SOLN
INTRAMUSCULAR | Status: AC
  Filled 2021-06-02: qty 30

## 2021-06-02 MED ORDER — IOHEXOL 300 MG/ML  SOLN
80.0000 mL | Freq: Once | INTRAMUSCULAR | Status: AC | PRN
  Administered 2021-06-02: 100 mL via INTRAVENOUS

## 2021-06-02 MED ORDER — BISACODYL 10 MG RE SUPP: 10.0000 mg | Freq: Every day | RECTAL | Status: DC | PRN

## 2021-06-02 MED ORDER — IOHEXOL 9 MG/ML PO SOLN
500.0000 mL | ORAL | Status: AC
  Administered 2021-06-02 (×2): 500 mL via ORAL

## 2021-06-02 MED ORDER — PROPOFOL 10 MG/ML IV BOLUS
INTRAVENOUS | Status: AC
  Filled 2021-06-02: qty 20

## 2021-06-02 MED ORDER — SENNOSIDES-DOCUSATE SODIUM 8.6-50 MG PO TABS
1.0000 | ORAL_TABLET | Freq: Every day | ORAL | Status: DC
  Administered 2021-06-03 – 2021-06-08 (×3): 1 via ORAL
  Filled 2021-06-02 (×6): qty 1

## 2021-06-02 MED ORDER — MORPHINE SULFATE (PF) 4 MG/ML IV SOLN
4.0000 mg | INTRAVENOUS | Status: DC | PRN
  Administered 2021-06-02 – 2021-06-03 (×2): 4 mg via INTRAVENOUS
  Filled 2021-06-02 (×3): qty 1

## 2021-06-02 SURGICAL SUPPLY — 47 items
BAG RETRIEVAL 10 (BASKET) ×1
BLADE CLIPPER SURG (BLADE) ×3 IMPLANT
CUTTER FLEX LINEAR 45M (STAPLE) ×3 IMPLANT
DERMABOND ADVANCED (GAUZE/BANDAGES/DRESSINGS) ×1
DERMABOND ADVANCED .7 DNX12 (GAUZE/BANDAGES/DRESSINGS) ×2 IMPLANT
ELECT REM PT RETURN 9FT ADLT (ELECTROSURGICAL) ×3
ELECTRODE REM PT RTRN 9FT ADLT (ELECTROSURGICAL) ×2 IMPLANT
GLOVE BIO SURGEON STRL SZ7 (GLOVE) ×1 IMPLANT
GLOVE BIOGEL PI IND STRL 7.5 (GLOVE) IMPLANT
GLOVE BIOGEL PI INDICATOR 7.5 (GLOVE) ×1
GLOVE ORTHO TXT STRL SZ7.5 (GLOVE) ×3 IMPLANT
GOWN STRL REUS W/ TWL LRG LVL3 (GOWN DISPOSABLE) ×2 IMPLANT
GOWN STRL REUS W/ TWL XL LVL3 (GOWN DISPOSABLE) ×2 IMPLANT
GOWN STRL REUS W/TWL LRG LVL3 (GOWN DISPOSABLE) ×2
GOWN STRL REUS W/TWL XL LVL3 (GOWN DISPOSABLE) ×1
GRASPER SUT TROCAR 14GX15 (MISCELLANEOUS) ×1 IMPLANT
IRRIGATION STRYKERFLOW (MISCELLANEOUS) IMPLANT
IRRIGATOR STRYKERFLOW (MISCELLANEOUS) ×3
IV NS IRRIG 3000ML ARTHROMATIC (IV SOLUTION) ×1 IMPLANT
KIT TURNOVER KIT A (KITS) ×3 IMPLANT
MANIFOLD NEPTUNE II (INSTRUMENTS) ×3 IMPLANT
NDL INSUFFLATION 14GA 120MM (NEEDLE) IMPLANT
NEEDLE HYPO 22GX1.5 SAFETY (NEEDLE) ×3 IMPLANT
NEEDLE INSUFFLATION 14GA 120MM (NEEDLE) ×3 IMPLANT
NS IRRIG 500ML POUR BTL (IV SOLUTION) ×3 IMPLANT
PACK LAP CHOLECYSTECTOMY (MISCELLANEOUS) ×3 IMPLANT
PENCIL ELECTRO HAND CTR (MISCELLANEOUS) ×1 IMPLANT
RELOAD 45 VASCULAR/THIN (ENDOMECHANICALS) IMPLANT
RELOAD STAPLE 45 2.5 WHT GRN (ENDOMECHANICALS) ×2 IMPLANT
RELOAD STAPLE 45 3.5 BLU ETS (ENDOMECHANICALS) IMPLANT
RELOAD STAPLE TA45 3.5 REG BLU (ENDOMECHANICALS) ×3 IMPLANT
SET TUBE SMOKE EVAC HIGH FLOW (TUBING) ×3 IMPLANT
SHEARS HARMONIC ACE PLUS 36CM (ENDOMECHANICALS) ×3 IMPLANT
SLEEVE ADV FIXATION 5X100MM (TROCAR) ×3 IMPLANT
SPIKE FLUID TRANSFER (MISCELLANEOUS) ×2 IMPLANT
SUT ETHIBOND 0 (SUTURE) ×1 IMPLANT
SUT MNCRL 4-0 (SUTURE) ×1
SUT MNCRL 4-0 27XMFL (SUTURE) ×2
SUT VICRYL 0 AB UR-6 (SUTURE) ×1 IMPLANT
SUTURE MNCRL 4-0 27XMF (SUTURE) ×2 IMPLANT
SYS BAG RETRIEVAL 10MM (BASKET) ×2
SYS KII FIOS ACCESS ABD 5X100 (TROCAR) ×3
SYSTEM BAG RETRIEVAL 10MM (BASKET) ×2 IMPLANT
SYSTEM KII FIOS ACES ABD 5X100 (TROCAR) ×2 IMPLANT
TRAY FOLEY MTR SLVR 16FR STAT (SET/KITS/TRAYS/PACK) ×2 IMPLANT
TROCAR ADV FIXATION 12X100MM (TROCAR) ×3 IMPLANT
WATER STERILE IRR 500ML POUR (IV SOLUTION) ×3 IMPLANT

## 2021-06-02 NOTE — Progress Notes (Addendum)
Progress Note    [name]  XTA:[id number] DOB: 1960-11-09  DOA: 05/29/2021 PCP: [name], PA      Brief Narrative:    Medical records reviewed and are as summarized below:   61 y.o. male with PMHx of bipolar disorder, anxiety, GERD, hypertension, urolithiasis and PTSD, who presented to the ER on 05/29/2021 with worsening right hand swelling and pain that started swelling a couple weeks ago after having a piece of glass on his third MCP.  He also reported that he had started having swelling in his left knee 4-5 days prior.  Noted hx of where he had his left TKA in 2014 that was complicated then by infection with septic arthritis and required washout procedure.  [place] seen in the ER 3 days prior to this presentation for his left knee pain and was discharged on Keflex and Bactrim after negative work-up.   Orthopedic surgery was consulted who recommended admission for IV antibiotics and close monitoring for both the left knee effusion and right hand cellulitis with associated third MCP tenosynovitis.  He complained of right lower quadrant abdominal pain while hospitalized..  CT abdomen pelvis revealed acute appendicitis. He was started on empiric IV antibiotics for this.      Assessment/Plan:   Principal Problem:   Cellulitis of right hand Active Problems:   Effusion of bursa of left knee   Essential hypertension   Bipolar disorder (HCC)   Depression   Acute appendicitis    Body mass index is 27.37 kg/m.  Right hand cellulitis, right third finger extensor tendon tenosynovitis, left knee effusion: S/p I&D of right hand abscess on 05/31/2021.  Wound culture from right hand is growing rare Staph aureus.  Discontinue IV cefepime.  Continue IV vancomycin. Of note, left knee fluid culture from 05/27/2021 did not show any growth.  Acute appendicitis: He complained of right lower quadrant pain today.  CT abdomen pelvis showed acute appendicitis.  Start IV  ceftriaxone and IV Flagyl.  Consulted Dr. Claudine Mouton, general surgeon, to assist with management.  Constipation noted on CT scan: Start MiraLAX, senna and bisacodyl suppository as needed.  Bipolar disorder, depression: Continue psychotropics.  Hypertension: Continue enalapril  1.3 cm right lower pole kidney mass: Outpatient follow-up with ultrasound in 6 months recommended for surveillance.  Diet Order             Diet Heart Room service appropriate? Yes; Fluid consistency: Thin  Diet effective now                          Consultants: Orthopedic surgeon, general surgeon  Procedures: I&D of right hand cellulitis/abscess on 05/31/2021    Medications:    acetaminophen  1,000 mg Oral Q8H   Or   acetaminophen  650 mg Rectal Q8H   doxepin  100 mg Oral QHS   enalapril  10 mg Oral Daily   enoxaparin (LOVENOX) injection  40 mg Subcutaneous Q24H   famotidine  20 mg Oral Daily   FLUoxetine  60 mg Oral Daily   gabapentin  400 mg Oral TID   ibuprofen  400 mg Oral Q8H   naltrexone  50 mg Oral Daily   OLANZapine  10 mg Oral QHS   OLANZapine  5 mg Oral Daily   pantoprazole  40 mg Oral Daily   polyethylene glycol  17 g Oral Daily   senna-docusate  1 tablet Oral QHS   Continuous Infusions:  cefTRIAXone (  ROCEPHIN)  IV     metronidazole     vancomycin 1,000 mg (06/02/21 0907)     Anti-infectives (From admission, onward)    Start     Dose/Rate Route Frequency Ordered Stop   06/02/21 2200  cefTRIAXone (ROCEPHIN) 2 g in sodium chloride 0.9 % 100 mL IVPB        2 g 200 mL/hr over 30 Minutes Intravenous Every 24 hours 06/02/21 1639     06/02/21 1800  metroNIDAZOLE (FLAGYL) IVPB 500 mg        500 mg 100 mL/hr over 60 Minutes Intravenous Every 12 hours 06/02/21 1639     05/30/21 1700  vancomycin (VANCOREADY) IVPB 1750 mg/350 mL  Status:  Discontinued        1,750 mg 175 mL/hr over 120 Minutes Intravenous Every 24 hours 05/30/21 0142 05/30/21 0733   05/30/21 1000   vancomycin (VANCOCIN) IVPB 1000 mg/200 mL premix        1,000 mg 200 mL/hr over 60 Minutes Intravenous Every 12 hours 05/30/21 0733     05/30/21 0600  ceFEPIme (MAXIPIME) 2 g in sodium chloride 0.9 % 100 mL IVPB  Status:  Discontinued        2 g 200 mL/hr over 30 Minutes Intravenous Every 8 hours 05/30/21 0123 06/02/21 1555   05/30/21 0230  vancomycin (VANCOREADY) IVPB 1250 mg/250 mL        1,250 mg 166.7 mL/hr over 90 Minutes Intravenous  Once 05/30/21 0142 05/30/21 0336   05/30/21 0215  vancomycin (VANCOCIN) IVPB 1000 mg/200 mL premix  Status:  Discontinued        1,000 mg 200 mL/hr over 60 Minutes Intravenous  Once 05/30/21 0123 05/30/21 0143   05/29/21 1600  cefTRIAXone (ROCEPHIN) 1 g in sodium chloride 0.9 % 100 mL IVPB        1 g 200 mL/hr over 30 Minutes Intravenous  Once 05/29/21 1547 05/29/21 1657   05/29/21 1600  vancomycin (VANCOCIN) IVPB 1000 mg/200 mL premix        1,000 mg 200 mL/hr over 60 Minutes Intravenous  Once 05/29/21 1547 05/29/21 1806              Family Communication/Anticipated D/C date and plan/Code Status   DVT prophylaxis: enoxaparin (LOVENOX) injection 40 mg Start: 05/29/21 2200     Code Status: Full Code  Family Communication: None Disposition Plan: Plan to discharge home in 2 to 3 days   Status is: Inpatient Remains inpatient appropriate because: IV antibiotics for appendicitis, right hand cellulitis       Subjective:   Interval events noted.  He complains of severe right lower quadrant abdominal pain.  No vomiting, diarrhea or abdominal pain.  He noted some small amount of bright red blood in his stool yesterday.  Objective:    Vitals:   06/01/21 0942 06/01/21 2014 06/02/21 0443 06/02/21 0829  BP: 135/89 (!) 153/86 (!) 160/87 (!) 128/95  Pulse: 81 84 95 93  Resp: 16 17 18    Temp: 97.8 F (36.6 C) 98 F (36.7 C) 98 F (36.7 C) 97.6 F (36.4 C)  TempSrc: Oral Oral Oral   SpO2: 97% 99% 95% 97%  Weight:      Height:        No data found.   Intake/Output Summary (Last 24 hours) at 06/02/2021 1649 Last data filed at 06/02/2021 1427 Gross per 24 hour  Intake 340 ml  Output 300 ml  Net 40 ml   [employer]  05/29/21 1253  Weight: 81.6 kg    Exam:  GEN: NAD SKIN: Surgical wound on right dorsal wrist without drainage EYES: EOMI ENT: MMM CV: RRR PULM: CTA B ABD: soft, ND, RLQ tenderness, no rebound tenderness or guarding, +BS CNS: AAO x 3, non focal EXT: No edema or tenderness. Left knee is swollen but without erythema or tenderness        Data Reviewed:   I have personally reviewed following labs and imaging studies:  Labs: Labs show the following:   Basic Metabolic Panel: Recent Labs  Lab 05/27/21 1312 05/29/21 1255 05/30/21 0512 05/31/21 1038 06/02/21 0343  NA 139 138 138 140  --   K 4.4 4.0 4.0 4.4  --   CL 110 104 109 109  --   CO2 21* 25 24 24   --   GLUCOSE 94 137* 98 85  --   BUN 18 14 14 16   --   CREATININE 1.04 1.11 1.02 1.00 0.93  CALCIUM 10.1 9.9 8.8* 8.7*  --    GFR Estimated Creatinine Clearance: 83.8 mL/min (by C-G formula based on SCr of 0.93 mg/dL). Liver Function Tests: Recent Labs  Lab 05/29/21 1255  AST 24  ALT 27  ALKPHOS 69  BILITOT 0.6  PROT 7.3  ALBUMIN 3.8   No results for input(s): LIPASE, AMYLASE in the last 168 hours. No results for input(s): AMMONIA in the last 168 hours. Coagulation profile No results for input(s): INR, PROTIME in the last 168 hours.  CBC: Recent Labs  Lab 05/27/21 1312 05/29/21 1255 05/30/21 0512  WBC 5.8 6.1 5.3  NEUTROABS 3.5 4.2  --   HGB 12.6* 12.6* 11.2*  HCT 39.1 39.4 34.1*  MCV 90.1 88.9 89.3  PLT 317 261 230   Cardiac Enzymes: No results for input(s): CKTOTAL, CKMB, CKMBINDEX, TROPONINI in the last 168 hours. BNP (last 3 results) No results for input(s): PROBNP in the last 8760 hours. CBG: No results for input(s): GLUCAP in the last 168 hours. D-Dimer: No results for input(s): DDIMER in the  last 72 hours. Hgb A1c: No results for input(s): HGBA1C in the last 72 hours. Lipid Profile: No results for input(s): CHOL, HDL, LDLCALC, TRIG, CHOLHDL, LDLDIRECT in the last 72 hours. Thyroid function studies: No results for input(s): TSH, T4TOTAL, T3FREE, THYROIDAB in the last 72 hours.  Invalid input(s): FREET3 Anemia work up: No results for input(s): VITAMINB12, FOLATE, FERRITIN, TIBC, IRON, RETICCTPCT in the last 72 hours. Sepsis Labs: Recent Labs  Lab 05/27/21 1312 05/29/21 1255 05/29/21 1500 05/30/21 0512  WBC 5.8 6.1  --  5.3  LATICACIDVEN  --  2.2* 1.3  --     Microbiology Recent Results (from the past 240 hour(s))  Body fluid culture w Gram Stain     Status: None   Collection Time: 05/27/21  4:12 PM   Specimen: Synovium; Body Fluid  Result Value Ref Range Status   Specimen Description   Final    SYNOVIAL L KNEE Performed at [facility], [address] [place], [place] [address]    Special Requests   Final    Normal Performed at [facility], [place], [place] [address]    Gram Stain   Final    NO SQUAMOUS EPITHELIAL CELLS SEEN FEW WBC SEEN NO ORGANISMS SEEN    Culture   Final    NO GROWTH 3 DAYS Performed at [facility] [address] [place], [place] [address]    Report  Status 05/31/2021 FINAL  Final  Culture, blood (routine x 2)     Status: None (Preliminary result)   Collection Time: 05/29/21  3:42 PM   Specimen: BLOOD  Result Value Ref Range Status   Specimen Description BLOOD LFOA  Final   Special Requests BOTTLES DRAWN AEROBIC AND ANAEROBIC BCAV  Final   Culture   Final    NO GROWTH 4 DAYS Performed at [facility], [address], [place], [place] [address]    Report Status PENDING  Incomplete  Culture, blood (routine x 2)     Status: None (Preliminary result)   Collection Time: 05/29/21  3:47 PM   Specimen: BLOOD  Result Value Ref Range Status   Specimen Description BLOOD BRH  Final    Special Requests BOTTLES DRAWN AEROBIC AND ANAEROBIC BCLV  Final   Culture   Final    NO GROWTH 4 DAYS Performed at [facility], [address] [place], [place] [address]    Report Status PENDING  Incomplete  Chlamydia/NGC rt PCR ([place] only)     Status: None   Collection Time: 05/29/21  7:06 PM   Specimen: Urine  Result Value Ref Range Status   Specimen source GC/Chlam URINE, RANDOM  Final   Chlamydia Tr NOT DETECTED NOT DETECTED Final   N gonorrhoeae NOT DETECTED NOT DETECTED Final    Comment: (NOTE) This CT/NG assay has not been evaluated in patients with a history of  hysterectomy. Performed at [facility], [address] [place], [place] [address]   Aerobic/Anaerobic Culture w Gram Stain (surgical/deep wound)     Status: None (Preliminary result)   Collection Time: 05/31/21  2:54 PM   Specimen: Wound  Result Value Ref Range Status   Specimen Description   Final    WOUND WD Performed at [facility], [address] [place], [place] [address]    Special Requests   Final    NONE Performed at [facility], [address] [place], [place] [address]    Gram Stain   Final    FEW WBC PRESENT, PREDOMINANTLY PMN NO ORGANISMS SEEN    Culture   Final    RARE STAPHYLOCOCCUS AUREUS SUSCEPTIBILITIES TO FOLLOW NO ANAEROBES ISOLATED Performed at [facility], [place] [address] [place], [place] [address]    Report Status PENDING  Incomplete    Procedures and diagnostic studies:  CT ABDOMEN PELVIS W CONTRAST  Result Date: 06/02/2021 CLINICAL DATA:  Right lower quadrant abdominal pain. EXAM: CT ABDOMEN AND PELVIS WITH CONTRAST TECHNIQUE: Multidetector CT imaging of the abdomen and pelvis was performed using the standard protocol following bolus administration of intravenous contrast. RADIATION DOSE REDUCTION: This exam was performed according to the departmental dose-optimization program which includes automated exposure control,  adjustment of the mA and/or kV according to patient size and/or use of iterative reconstruction technique. CONTRAST:  124mL OMNIPAQUE IOHEXOL 300 MG/ML  SOLN COMPARISON:  CT abdomen and pelvis 04/06/2020 and 01/27/2016; MRI abdomen 04/07/2020 FINDINGS: Lower chest: Mild motion artifact limits evaluation of the lung bases. No gross abnormality. Hepatobiliary: Smooth liver contours. Mildly decreased density throughout the liver suggesting fatty infiltration. Tiny low-density focus adjacent to the gallbladder fossa unchanged and possibly a tiny cyst. The gallbladder is unremarkable. Pancreas: Unremarkable. No pancreatic ductal dilatation or surrounding inflammatory changes. Spleen: Normal in size without focal abnormality. Adrenals/Urinary Tract: Normal adrenals. The kidneys enhance uniformly and are symmetrically chicken size without hydronephrosis. There multiple small low-attenuation lesions within  the bilateral kidneys. Stable 13 mm low-attenuation lesion within the posterior inferior right kidney (axial series 2, image 41) demonstrates internal density of 28 Hounsfield units. Again this may represent a solid mass, and renal cell carcinoma cannot be excluded. This is again increased in size from approximately 8 mm on 01/27/2016 CT. Left lower pole 1.4 cm (axial image 42) fluid attenuation cyst is slightly increased from 11 mm previously. Just anterior to this, there is a 12 mm low-attenuation lesion with internal density of 14 Hounsfield units, previously 11 mm, compatible with an additional cyst. No focal urinary bladder wall thickening is seen. Stomach/Bowel: There is high-grade stool seen throughout all portions of the colon increased from prior and suggesting constipation. The terminal ileum is unremarkable. The appendix is enlarged measuring up to 15 mm in caliber, and demonstrates mild-to-moderate surrounding inflammatory fat stranding and wall thickening. There are 2 mid to distal appendiceal appendicoliths,  with the larger more proximal appendicolith measuring up to 7 mm (axial series 2 images 57 through 63). No dilated loops of bowel to indicate bowel obstruction. Vascular/Lymphatic: Mild atherosclerotic calcifications. No abdominal aortic aneurysm. The major intra-abdominal aortic branch vessels are patent. Reproductive: The prostate and seminal vesicles are unremarkable. Other: There is a fat containing umbilical hernia measuring up to 3.0 cm in transverse dimension similar to prior. No free air or free fluid is seen within the abdomen or pelvis. Musculoskeletal: No acute skeletal abnormality. IMPRESSION: 1. Acute appendicitis.  No perforation or abscess is seen. 2. Within the posterior lower pole of the right kidney there is a 13 mm low-attenuation lesion of the is not qualify as a simple cyst. This is stable in size from 04/06/2020 CT but increased in size from 01/27/2016 CT. This remains indeterminate for a complex cyst versus solid mass on 04/07/2020 MRI. The stability over the past 13 months is reassuring for a benign process. Consider follow-up ultrasound in 6 months for surveillance. 3. High-grade stool throughout all portions of the colon suggesting constipation. Electronically Signed   By: Yvonne Kendall M.D.   On: 06/02/2021 15:18               LOS: 4 days   [name]  [facility]   Pager on [email or web address]. If 7PM-7AM, please contact night-coverage at [email or web address]     06/02/2021, 4:49 PM

## 2021-06-02 NOTE — Clinical Social Work Note (Signed)
  Transition of Care (TOC) Screening Note   Patient Details  Name: Randeep Colon Messmer Date of Birth: 1960/08/15   Transition of Care [facility]) CM/SW Contact:    Maree Krabbe, LCSW Phone Number:[phone] 06/02/2021, 11:48 AM    Transition of Care Department [facility]) has reviewed patient and no TOC needs have been identified at this time. We will continue to monitor patient advancement through interdisciplinary progression rounds. If new patient transition needs arise, please place a TOC consult.

## 2021-06-02 NOTE — Anesthesia Preprocedure Evaluation (Addendum)
Anesthesia Evaluation  Patient identified by MRN, date of birth, ID band Patient awake    Reviewed: Allergy & Precautions, H&P , NPO status , Patient's Chart, lab work & pertinent test results  History of Anesthesia Complications Negative for: history of anesthetic complications  Airway Mallampati: III  TM Distance: >3 FB     Dental  (+) Edentulous Upper, Missing   Pulmonary neg pulmonary ROS, neg sleep apnea, neg COPD, former smoker,    breath sounds clear to auscultation       Cardiovascular hypertension, (-) angina+ CAD  (-) Past MI and (-) Cardiac Stents (-) dysrhythmias  Rhythm:regular Rate:Tachycardia  ECHO 2019: - Left ventricle: The cavity size was normal. Systolic function was  normal. The estimated ejection fraction was in the range of 60%  to 65%. Wall motion was normal; there were no regional wall  motion abnormalities. Left ventricular diastolic function  parameters were normal.  - Mitral valve: There was mild regurgitation.  - Left atrium: The atrium was mildly dilated.  - Right ventricle: Systolic function was normal.  - Pulmonary arteries: Systolic pressure was within the normal  range.  - Pericardium, extracardiac: There was moderate calcification.  Atypical chest pain   Neuro/Psych PSYCHIATRIC DISORDERS Anxiety Depression Bipolar Disorder negative neurological ROS  negative psych ROS   GI/Hepatic hiatal hernia, GERD  Poorly Controlled and Medicated,NAFLD enteritis / duodenitis   acute appendicitis   Endo/Other  negative endocrine ROS  Renal/GU Renal disease: AKI.negative Renal ROS  negative genitourinary   Musculoskeletal  (+) Arthritis ,   Abdominal (+)  Abdomen: tender.    Peds  Hematology  (+) Blood dyscrasia, anemia ,   Anesthesia Other Findings left knee effusion and right hand cellulitis with associated third MCP tenosynovitis.  Past Medical History: No date:  Anxiety No date: Arthritis     Comment:  knees and hands No date: Bipolar disorder (HCC) No date: Depression No date: GERD (gastroesophageal reflux disease) No date: Hepatitis     Comment:  HEP "C" No date: History of kidney stones No date: Hypertension 02/06/2018: Infection of prosthetic left knee joint ([place]) No date: Kidney stones 05/2015: Pericarditis     Comment:  a. echo 5/17: EF 60-65%, no RWMA, LV dias fxn nl, LA               mildly dilated, RV sys fxn nl, PASP nl, moderate sized               circumferential pericardial effusion was identified, 2.12              cm around the LV free wall, <1 cm around the RV free               wall. Features were not c/w tamponade physiology No date: PTSD (post-traumatic stress disorder)     Comment:  Witnessed brother's suicide. No date: Restless leg syndrome No date: Syncope  Past Surgical History: No date: CYSTOSCOPY WITH URETEROSCOPY AND STENT PLACEMENT 01/11/2016: ESOPHAGOGASTRODUODENOSCOPY; N/A     Comment:  Procedure: ESOPHAGOGASTRODUODENOSCOPY (EGD);  Surgeon:               Wilford Corner, MD;  Location: [facility] ENDOSCOPY;                Service: Endoscopy;  Laterality: N/A; 01/02/2018: INCISION AND DRAINAGE ABSCESS; Left     Comment:  Procedure: INCISION AND DRAINAGE LEFT KNEE;  Surgeon:  Earnestine Leys, MD;  Location: [facility] ORS;  Service:               Orthopedics;  Laterality: Left; No date: JOINT REPLACEMENT; Right     Comment:  TKR 06/25/2014: KNEE ARTHROSCOPY; Right     Comment:  Procedure: ARTHROSCOPY KNEE;  Surgeon: Earnestine Leys,               MD;  Location: [facility] ORS;  Service: Orthopedics;                Laterality: Right;  partial arthroscopic medial               menisectomy 04/22/2015: TOTAL KNEE ARTHROPLASTY; Right     Comment:  Procedure: TOTAL KNEE ARTHROPLASTY;  Surgeon: Earnestine Leys, MD;  Location: [facility] ORS;  Service: Orthopedics;                Laterality: Right; 10/30/2017: TOTAL  KNEE ARTHROPLASTY; Left     Comment:  Procedure: TOTAL KNEE ARTHROPLASTY;  Surgeon: Earnestine Leys, MD;  Location: [facility] ORS;  Service: Orthopedics;                Laterality: Left; 01/02/2018: TOTAL KNEE REVISION; Left     Comment:  Procedure: poly exchange of tibia and patella left knee;              Surgeon: Earnestine Leys, MD;  Location: [facility] ORS;                Service: Orthopedics;  Laterality: Left;  BMI    Body Mass Index: 26.89 kg/m      Reproductive/Obstetrics negative OB ROS                            Anesthesia Physical  Anesthesia Plan  ASA: III  Anesthesia Plan: General   Post-op Pain Management: Ofirmev IV (intra-op)*, Toradol IV (intra-op)* and Ketamine IV*   Induction: Intravenous and Rapid sequence  PONV Risk Score and Plan: 2 and Ondansetron, Dexamethasone and Midazolam  Airway Management Planned: Oral ETT  Additional Equipment:   Intra-op Plan:   Post-operative Plan: Extubation in OR  Informed Consent: I have reviewed the patients History and Physical, chart, labs and discussed the procedure including the risks, benefits and alternatives for the proposed anesthesia with the patient or authorized representative who has indicated his/her understanding and acceptance.     Dental Advisory Given  Plan Discussed with: Anesthesiologist, CRNA and Surgeon  Anesthesia Plan Comments:        Anesthesia Quick Evaluation

## 2021-06-02 NOTE — Consult Note (Signed)
Patient ID: [name], male   DOB: 26-Sep-1960, 61 y.o.   MRN: [id number]  Chief Complaint: Right lower quadrant pain  History of Present Illness [name] is a 61 y.o. male with acute onset of right lower quadrant pain last night with progression through the night and continuance through today.  Patient currently on IV antibiotics for cellulitis of his right hand.  Denies fevers and chills, reports anorexia.  Pain appears to be well localized right lower quadrant.  No prior history of similar pain.  CT scan obtained today confirming acute appendicitis.  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 echo 5/17: EF 60-65%, no RWMA, LV dias fxn nl, LA mildly dilated, RV sys fxn nl, PASP nl, moderate sized circumferential pericardial effusion was identified, 2.12 cm around the LV free wall, <1 cm around the RV free wall. Features were not c/w tamponade physiology   PTSD (post-traumatic stress disorder)    Witnessed brother's suicide.   Restless leg syndrome    Syncope       Past Surgical History:  Procedure Laterality Date   CYSTOSCOPY WITH URETEROSCOPY AND STENT PLACEMENT     ESOPHAGOGASTRODUODENOSCOPY N/A 01/11/2016   Procedure: ESOPHAGOGASTRODUODENOSCOPY (EGD);  Surgeon: Wilford Corner, MD;  Location: [facility] ENDOSCOPY;  Service: Endoscopy;  Laterality: N/A;   ESOPHAGOGASTRODUODENOSCOPY N/A 04/09/2020   Procedure: ESOPHAGOGASTRODUODENOSCOPY (EGD);  Surgeon: Jonathon Bellows, MD;  Location: [facility] ENDOSCOPY;  Service: Gastroenterology;  Laterality: N/A;   INCISION AND DRAINAGE ABSCESS Left 01/02/2018   Procedure: INCISION AND DRAINAGE LEFT KNEE;  Surgeon: Earnestine Leys, MD;   Location: [facility] ORS;  Service: Orthopedics;  Laterality: Left;   JOINT REPLACEMENT Right    TKR   KNEE ARTHROSCOPY Right 06/25/2014   Procedure: ARTHROSCOPY KNEE;  Surgeon: Earnestine Leys, MD;  Location: [facility] ORS;  Service: Orthopedics;  Laterality: Right;  partial arthroscopic medial menisectomy   TOTAL KNEE ARTHROPLASTY Right 04/22/2015   Procedure: TOTAL KNEE ARTHROPLASTY;  Surgeon: Earnestine Leys, MD;  Location: [facility] ORS;  Service: Orthopedics;  Laterality: Right;   TOTAL KNEE ARTHROPLASTY Left 10/30/2017   Procedure: TOTAL KNEE ARTHROPLASTY;  Surgeon: Earnestine Leys, MD;  Location: [facility] ORS;  Service: Orthopedics;  Laterality: Left;   TOTAL KNEE REVISION Left 01/02/2018   Procedure: poly exchange of tibia and patella left knee;  Surgeon: Earnestine Leys, MD;  Location: [facility] ORS;  Service: Orthopedics;  Laterality: Left;    No Active Allergies  Current Facility-Administered Medications  Medication Dose Route Frequency Provider Last Rate Last Admin   acetaminophen (TYLENOL) tablet 1,000 mg  1,000 mg Oral Q8H Griffith, Kelly A, DO   1,000 mg at 06/02/21 1442   Or   acetaminophen (TYLENOL) suppository 650 mg  650 mg Rectal Q8H Nicole Kindred A, DO       alum & mag hydroxide-simeth (MAALOX/MYLANTA) 200-200-20 MG/5ML suspension 15 mL  15 mL Oral Q6H PRN Nicole Kindred A, DO   15 mL at 06/02/21 1442   bisacodyl (DULCOLAX) suppository 10 mg  10 mg Rectal Daily PRN Jennye Boroughs, MD       cefTRIAXone (ROCEPHIN)  2 g in sodium chloride 0.9 % 100 mL IVPB  2 g Intravenous Q24H Jennye Boroughs, MD       doxepin [facility]) capsule 100 mg  100 mg Oral QHS Mansy, Jan A, MD   100 mg at 06/01/21 2107   enalapril (VASOTEC) tablet 10 mg  10 mg Oral Daily Mansy, Jan A, MD   10 mg at 06/02/21 0902   enoxaparin (LOVENOX) injection 40 mg  40 mg Subcutaneous Q24H Mansy, Jan A, MD   40 mg at 06/01/21 2017   famotidine (PEPCID) tablet 20 mg  20 mg Oral Daily Nicole Kindred A, DO   20 mg at 06/02/21 0902    FLUoxetine (PROZAC) capsule 60 mg  60 mg Oral Daily Dallie Piles, RPH   60 mg at 06/02/21 0902   gabapentin (NEURONTIN) capsule 400 mg  400 mg Oral TID Mansy, Jan A, MD   400 mg at 06/02/21 1731   ibuprofen (ADVIL) tablet 400 mg  400 mg Oral Q8H Nicole Kindred A, DO   400 mg at 06/02/21 1442   magnesium hydroxide (MILK OF MAGNESIA) suspension 30 mL  30 mL Oral Daily PRN Mansy, Jan A, MD       metroNIDAZOLE (FLAGYL) IVPB 500 mg  500 mg Intravenous Q12H Jennye Boroughs, MD   Stopped at 06/02/21 1855   morphine (PF) 4 MG/ML injection 4 mg  4 mg Intravenous Q4H PRN Jennye Boroughs, MD   4 mg at 06/02/21 1838   naltrexone (DEPADE) tablet 50 mg  50 mg Oral Daily Dallie Piles, RPH   50 mg at 06/02/21 0902   OLANZapine (ZYPREXA) tablet 10 mg  10 mg Oral QHS Dallie Piles, RPH   10 mg at 06/01/21 2016   OLANZapine (ZYPREXA) tablet 5 mg  5 mg Oral Daily Dallie Piles, RPH   5 mg at 06/02/21 0902   ondansetron (ZOFRAN) tablet 4 mg  4 mg Oral Q6H PRN Mansy, Jan A, MD       Or   ondansetron [facility]) injection 4 mg  4 mg Intravenous Q6H PRN Mansy, Jan A, MD       oxyCODONE (Oxy IR/ROXICODONE) immediate release tablet 5 mg  5 mg Oral Q4H PRN Nicole Kindred A, DO   5 mg at 06/02/21 1731   pantoprazole (PROTONIX) EC tablet 40 mg  40 mg Oral Daily Mansy, Jan A, MD   40 mg at 06/02/21 0902   polyethylene glycol (MIRALAX / GLYCOLAX) packet 17 g  17 g Oral Daily Jennye Boroughs, MD   17 g at 06/02/21 1730   senna-docusate (Senokot-S) tablet 1 tablet  1 tablet Oral QHS Jennye Boroughs, MD       traZODone (DESYREL) tablet 25 mg  25 mg Oral QHS PRN Mansy, Jan A, MD   25 mg at 05/31/21 2254   traZODone (DESYREL) tablet 25 mg  25 mg Oral TID PRN Mansy, Jan A, MD   25 mg at 05/30/21 1013   vancomycin (VANCOCIN) IVPB 1000 mg/200 mL premix  1,000 mg Intravenous Q12H Oswald Hillock, [facility]   Stopped at 06/02/21 1035    Family History Family History  Problem Relation Age of Onset   CVA Mother        deceased at age  22   Depression Brother        Died by suicide at age 73      Social History Social History   Tobacco Use   Smoking status: Former  Packs/day: 0.75    Years: 20.00    Pack years: 15.00    Types: Cigarettes    Quit date: 05/16/1984    Years since quitting: 37.0   Smokeless tobacco: Never  Vaping Use   Vaping Use: Never used  Substance Use Topics   Alcohol use: Yes   Drug use: Yes    Types: Marijuana        Review of Systems  All other systems reviewed and are negative.    Physical Exam Blood pressure (!) 141/97, pulse 89, temperature 97.6 F (36.4 C), resp. rate 18, height 5\' 8"  (1.727 m), weight 81.6 kg, SpO2 94 %. Last Weight  Most recent update: 05/29/2021 12:53 PM    Weight  81.6 kg (180 lb)             CONSTITUTIONAL: Well developed, and nourished, appropriately responsive and aware without distress.   EYES: Sclera non-icteric.   EARS, NOSE, MOUTH AND THROAT:  The oropharynx is clear. Oral mucosa is pink and moist.    Hearing is intact to voice.  NECK: Trachea is midline, and there is no jugular venous distension.  LYMPH NODES:  Lymph nodes in the neck are not enlarged. RESPIRATORY:  Lungs are clear, and breath sounds are equal bilaterally. Normal respiratory effort without pathologic use of accessory muscles. CARDIOVASCULAR: Heart is regular in rate and rhythm. GI: The abdomen is localized with tenderness to the right lower quadrant right mid abdomen, otherwise soft, nontender, and nondistended. There were no palpable masses.  SKIN: Skin turgor is normal. No pathologic skin lesions appreciated.  Right hand with receding cellulitic changes from markings made previously. NEUROLOGIC:  Motor and sensation appear grossly normal.  Cranial nerves are grossly without defect. PSYCH:  Alert and oriented to person, place and time. Affect is appropriate for situation.  Data Reviewed I have personally reviewed what is currently available of the patient's imaging,  recent labs and medical records.   Labs:     Latest Ref Rng & Units 05/30/2021    5:12 AM 05/29/2021   12:55 PM 05/27/2021    1:12 PM  CBC  WBC 4.0 - 10.5 K/uL 5.3   6.1   5.8    Hemoglobin 13.0 - 17.0 g/dL 11.2   12.6   12.6    Hematocrit 39.0 - 52.0 % 34.1   39.4   39.1    Platelets 150 - 400 K/uL 230   261   317        Latest Ref Rng & Units 06/02/2021    3:43 AM 05/31/2021   10:38 AM 05/30/2021    5:12 AM  CMP  Glucose 70 - 99 mg/dL  85   98    BUN 6 - 20 mg/dL  16   14    Creatinine 0.61 - 1.24 mg/dL 0.93   1.00   1.02    Sodium 135 - 145 mmol/L  140   138    Potassium 3.5 - 5.1 mmol/L  4.4   4.0    Chloride 98 - 111 mmol/L  109   109    CO2 22 - 32 mmol/L  24   24    Calcium 8.9 - 10.3 mg/dL  8.7   8.8        Imaging:  Within last 24 hrs: CT ABDOMEN PELVIS W CONTRAST  Result Date: 06/02/2021 CLINICAL DATA:  Right lower quadrant abdominal pain. EXAM: CT ABDOMEN AND PELVIS WITH CONTRAST TECHNIQUE: Multidetector CT imaging of  the abdomen and pelvis was performed using the standard protocol following bolus administration of intravenous contrast. RADIATION DOSE REDUCTION: This exam was performed according to the departmental dose-optimization program which includes automated exposure control, adjustment of the mA and/or kV according to patient size and/or use of iterative reconstruction technique. CONTRAST:  170mL OMNIPAQUE IOHEXOL 300 MG/ML  SOLN COMPARISON:  CT abdomen and pelvis 04/06/2020 and 01/27/2016; MRI abdomen 04/07/2020 FINDINGS: Lower chest: Mild motion artifact limits evaluation of the lung bases. No gross abnormality. Hepatobiliary: Smooth liver contours. Mildly decreased density throughout the liver suggesting fatty infiltration. Tiny low-density focus adjacent to the gallbladder fossa unchanged and possibly a tiny cyst. The gallbladder is unremarkable. Pancreas: Unremarkable. No pancreatic ductal dilatation or surrounding inflammatory changes. Spleen: Normal in size  without focal abnormality. Adrenals/Urinary Tract: Normal adrenals. The kidneys enhance uniformly and are symmetrically chicken size without hydronephrosis. There multiple small low-attenuation lesions within the bilateral kidneys. Stable 13 mm low-attenuation lesion within the posterior inferior right kidney (axial series 2, image 41) demonstrates internal density of 28 Hounsfield units. Again this may represent a solid mass, and renal cell carcinoma cannot be excluded. This is again increased in size from approximately 8 mm on 01/27/2016 CT. Left lower pole 1.4 cm (axial image 42) fluid attenuation cyst is slightly increased from 11 mm previously. Just anterior to this, there is a 12 mm low-attenuation lesion with internal density of 14 Hounsfield units, previously 11 mm, compatible with an additional cyst. No focal urinary bladder wall thickening is seen. Stomach/Bowel: There is high-grade stool seen throughout all portions of the colon increased from prior and suggesting constipation. The terminal ileum is unremarkable. The appendix is enlarged measuring up to 15 mm in caliber, and demonstrates mild-to-moderate surrounding inflammatory fat stranding and wall thickening. There are 2 mid to distal appendiceal appendicoliths, with the larger more proximal appendicolith measuring up to 7 mm (axial series 2 images 57 through 63). No dilated loops of bowel to indicate bowel obstruction. Vascular/Lymphatic: Mild atherosclerotic calcifications. No abdominal aortic aneurysm. The major intra-abdominal aortic branch vessels are patent. Reproductive: The prostate and seminal vesicles are unremarkable. Other: There is a fat containing umbilical hernia measuring up to 3.0 cm in transverse dimension similar to prior. No free air or free fluid is seen within the abdomen or pelvis. Musculoskeletal: No acute skeletal abnormality. IMPRESSION: 1. Acute appendicitis.  No perforation or abscess is seen. 2. Within the posterior lower  pole of the right kidney there is a 13 mm low-attenuation lesion of the is not qualify as a simple cyst. This is stable in size from 04/06/2020 CT but increased in size from 01/27/2016 CT. This remains indeterminate for a complex cyst versus solid mass on 04/07/2020 MRI. The stability over the past 13 months is reassuring for a benign process. Consider follow-up ultrasound in 6 months for surveillance. 3. High-grade stool throughout all portions of the colon suggesting constipation. Electronically Signed   By: Yvonne Kendall M.D.   On: 06/02/2021 15:18    Assessment    Acute appendicitis, well on IV antibiotic for cellulitis. 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 The risks, benefits, complications, treatment options, and expected outcomes were discussed with the patient. The treatment of antibiotics alone was discussed giving a 20% chance that this could fail and surgery would be necessary.  Also discussed continuing to the operating room for Laparoscopic Appendectomy.  The possibilities of  bleeding, recurrent infection, perforation of viscus, finding a normal appendix, the need for additional procedures, failure to diagnose a condition, conversion to open procedure and creating a complication requiring transfusion or further operations were discussed. The patient was given the opportunity to ask questions and have them answered.  Patient would like to proceed with Laparoscopic Appendectomy and consent was obtained.     Latest Ref Rng & Units 05/30/2021    5:12 AM 05/29/2021    12:55 PM 05/27/2021    1:12 PM  CBC  WBC 4.0 - 10.5 K/uL 5.3   6.1   5.8    Hemoglobin 13.0 - 17.0 g/dL 11.2   12.6   12.6    Hematocrit 39.0 - 52.0 % 34.1   39.4   39.1    Platelets 150 - 400 K/uL 230   261   317       Face-to-face time spent with the patient and accompanying care providers(if present) was 30 minutes, with more than 50% of the time spent counseling, educating, and coordinating care of the patient.    These notes generated with voice recognition software. I apologize for typographical errors.  Ronny Bacon M.D., FACS 06/02/2021, 8:00 PM

## 2021-06-03 ENCOUNTER — Other Ambulatory Visit: Payer: Self-pay

## 2021-06-03 ENCOUNTER — Encounter: Payer: Self-pay | Admitting: Surgery

## 2021-06-03 DIAGNOSIS — K353 Acute appendicitis with localized peritonitis, without perforation or gangrene: Secondary | ICD-10-CM | POA: Diagnosis not present

## 2021-06-03 DIAGNOSIS — M25462 Effusion, left knee: Secondary | ICD-10-CM | POA: Diagnosis not present

## 2021-06-03 DIAGNOSIS — F3132 Bipolar disorder, current episode depressed, moderate: Secondary | ICD-10-CM | POA: Diagnosis not present

## 2021-06-03 DIAGNOSIS — L03113 Cellulitis of right upper limb: Secondary | ICD-10-CM | POA: Diagnosis not present

## 2021-06-03 LAB — CBC
HCT: 38.8 % — ABNORMAL LOW (ref 39.0–52.0)
Hemoglobin: 13 g/dL (ref 13.0–17.0)
MCH: 29.2 pg (ref 26.0–34.0)
MCHC: 33.5 g/dL (ref 30.0–36.0)
MCV: 87.2 fL (ref 80.0–100.0)
Platelets: 247 10*3/uL (ref 150–400)
RBC: 4.45 MIL/uL (ref 4.22–5.81)
RDW: 14.5 % (ref 11.5–15.5)
WBC: 9.2 10*3/uL (ref 4.0–10.5)
nRBC: 0 % (ref 0.0–0.2)

## 2021-06-03 LAB — CULTURE, BLOOD (ROUTINE X 2)
Culture: NO GROWTH
Culture: NO GROWTH

## 2021-06-03 MED ORDER — DROPERIDOL 2.5 MG/ML IJ SOLN: 0.6250 mg | Freq: Once | INTRAMUSCULAR | Status: DC | PRN

## 2021-06-03 MED ORDER — SUCCINYLCHOLINE CHLORIDE 200 MG/10ML IV SOSY
PREFILLED_SYRINGE | INTRAVENOUS | Status: DC | PRN
  Administered 2021-06-03: 140 mg via INTRAVENOUS

## 2021-06-03 MED ORDER — ONDANSETRON HCL 4 MG/2ML IJ SOLN
INTRAMUSCULAR | Status: DC | PRN
Start: 2021-06-03 — End: 2021-06-03
  Administered 2021-06-03: 4 mg via INTRAVENOUS

## 2021-06-03 MED ORDER — LIDOCAINE HCL (CARDIAC) PF 100 MG/5ML IV SOSY
PREFILLED_SYRINGE | INTRAVENOUS | Status: DC | PRN
  Administered 2021-06-03: 100 mg via INTRAVENOUS

## 2021-06-03 MED ORDER — SODIUM CHLORIDE 0.9 % IR SOLN
Status: DC | PRN
  Administered 2021-06-03: 3000 mL

## 2021-06-03 MED ORDER — ROCURONIUM BROMIDE 100 MG/10ML IV SOLN
INTRAVENOUS | Status: DC | PRN
  Administered 2021-06-03: 50 mg via INTRAVENOUS

## 2021-06-03 MED ORDER — LACTATED RINGERS IV SOLN: INTRAVENOUS | Status: DC | PRN

## 2021-06-03 MED ORDER — PROPOFOL 10 MG/ML IV BOLUS
INTRAVENOUS | Status: DC | PRN
Start: 2021-06-03 — End: 2021-06-03
  Administered 2021-06-03 (×2): 40 ug via INTRAVENOUS
  Administered 2021-06-03: 200 ug via INTRAVENOUS

## 2021-06-03 MED ORDER — DEXAMETHASONE SODIUM PHOSPHATE 10 MG/ML IJ SOLN
INTRAMUSCULAR | Status: DC | PRN
  Administered 2021-06-03: 10 mg via INTRAVENOUS

## 2021-06-03 MED ORDER — ACETAMINOPHEN 10 MG/ML IV SOLN
INTRAVENOUS | Status: DC | PRN
  Administered 2021-06-03: 1000 mg via INTRAVENOUS

## 2021-06-03 MED ORDER — KETOROLAC TROMETHAMINE 30 MG/ML IJ SOLN
INTRAMUSCULAR | Status: DC | PRN
  Administered 2021-06-03: 30 mg via INTRAVENOUS

## 2021-06-03 MED ORDER — PROMETHAZINE HCL 25 MG/ML IJ SOLN: 6.2500 mg | INTRAMUSCULAR | Status: DC | PRN

## 2021-06-03 MED ORDER — FENTANYL CITRATE (PF) 100 MCG/2ML IJ SOLN
INTRAMUSCULAR | Status: AC
  Filled 2021-06-03: qty 2

## 2021-06-03 MED ORDER — SODIUM CHLORIDE 0.9 % IV SOLN
INTRAVENOUS | Status: DC | PRN
Start: 2021-06-03 — End: 2021-06-09

## 2021-06-03 MED ORDER — 0.9 % SODIUM CHLORIDE (POUR BTL) OPTIME
TOPICAL | Status: DC | PRN
  Administered 2021-06-03: 10 mL

## 2021-06-03 MED ORDER — HYDROMORPHONE HCL 1 MG/ML IJ SOLN
0.5000 mg | INTRAMUSCULAR | Status: DC | PRN
  Administered 2021-06-03 (×4): 0.5 mg via INTRAVENOUS

## 2021-06-03 MED ORDER — CEFADROXIL 500 MG PO CAPS
500.0000 mg | ORAL_CAPSULE | Freq: Two times a day (BID) | ORAL | Status: DC
  Administered 2021-06-03 – 2021-06-05 (×4): 500 mg via ORAL
  Filled 2021-06-03 (×4): qty 1

## 2021-06-03 MED ORDER — SUGAMMADEX SODIUM 200 MG/2ML IV SOLN
INTRAVENOUS | Status: DC | PRN
  Administered 2021-06-03: 200 mg via INTRAVENOUS

## 2021-06-03 MED ORDER — HYDROMORPHONE HCL 1 MG/ML IJ SOLN
INTRAMUSCULAR | Status: AC
  Filled 2021-06-03: qty 1

## 2021-06-03 MED ORDER — DEXMEDETOMIDINE HCL IN NACL 200 MCG/50ML IV SOLN
INTRAVENOUS | Status: DC | PRN
  Administered 2021-06-03: 8 ug via INTRAVENOUS

## 2021-06-03 MED ORDER — ACETAMINOPHEN 10 MG/ML IV SOLN
INTRAVENOUS | Status: AC
  Filled 2021-06-03: qty 100

## 2021-06-03 MED ORDER — FENTANYL CITRATE (PF) 100 MCG/2ML IJ SOLN
INTRAMUSCULAR | Status: DC | PRN
  Administered 2021-06-03 (×4): 50 ug via INTRAVENOUS

## 2021-06-03 MED ORDER — PROPOFOL 10 MG/ML IV BOLUS
INTRAVENOUS | Status: AC
  Filled 2021-06-03: qty 20

## 2021-06-03 MED ORDER — BUPIVACAINE-EPINEPHRINE 0.25% -1:200000 IJ SOLN
INTRAMUSCULAR | Status: DC | PRN
  Administered 2021-06-03: 30 mL

## 2021-06-03 MED ORDER — METOPROLOL TARTRATE 5 MG/5ML IV SOLN
INTRAVENOUS | Status: DC | PRN
  Administered 2021-06-03: 2.5 mg via INTRAVENOUS

## 2021-06-03 NOTE — Plan of Care (Signed)
  Problem: Clinical Measurements: Goal: Ability to avoid or minimize complications of infection will improve Outcome: Progressing   Problem: Education: Goal: Knowledge of General Education information will improve Description: Including pain rating scale, medication(s)/side effects and non-pharmacologic comfort measures Outcome: Progressing   

## 2021-06-03 NOTE — Progress Notes (Signed)
At around 02:45 while patient was in the PACU and getting ready to return to his room we were having a conversation, he was talking about he had been with this lady for many years and she was taken from him as well as his dog. He spoke that he had nothing to live for and that he wished he had not woken up. 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 He seemed very depressed and was very sad. Pt said he is the cause of the place on his hand due to digging in it and keep opening the wound. He also showed me a place where he had just cut while staying in the place called [facility]. While taking him to his room, he kept talking about how he was alone and nobody cared. Once the patient was in his room I waited for nurse  Marchelle Folks to step out and told her what he had been telling me. AC Judeth Cornfield was called. I then went back into his room and told [name] that I had spoke to his nurse about what we had discussed and that I was very concerned for his safety and well being. He then told me again that he had urges to cut himself and that he could not help it. He also said he really wished he had not woken up from surgery. He then went and told me he was  grateful and not up set that I wanted him to seek help, and had told someone what our conversations were about.

## 2021-06-03 NOTE — Consult Note (Signed)
[facility]   Reason for Consult: Consult for 61 year old man with a history of depression and suicidal ideation who had been admitted to the medical service for some orthopedic infections.  Now medically cleared. Referring Physician:  Myriam ForehandAyiku Patient Identification: [name] MRN:  [id number] Principal Diagnosis: Bipolar disorder (HCC) Diagnosis:  Principal Problem:   Bipolar disorder (HCC) Active Problems:   Essential hypertension   Cellulitis of right hand   Effusion of bursa of left knee   Depression   Acute appendicitis   Total Time spent with patient: 45 minutes  Subjective:   [name] is a 61 y.o. male patient admitted with "I think I need a few days to get myself calm down".  HPI: Patient seen and chart reviewed.  Patient known from previous encounters.  61 year old man with a history of a diagnosis of bipolar but who also has significant features of personality disorder and chronic depression.  He was admitted to the medical service after presenting with pain in his knee and also in his hand which turned out to represent a deep tendinitis that needed IV antibiotics.  Patient had made suicidal statements since being in the hospital.  He tells me today that he has [facility] about a month and during that time he has been staying clean and has not relapsed on alcohol or drugs.  He claims he has been taking psychiatric medicine but does not remember what it is.  He says he has been feeling more anxious and stressed out as he often does and had been having thoughts about hurting himself and on one occasion recently before coming to the hospital had cut himself on the forearm.  Patient says he is still feeling very nervous and has intrusive thoughts about killing himself.  He denies actually wanting to die but says that as had been the case before he has trouble resisting the urges.  Denies any psychosis or hallucinations.  Currently  cooperative with treatment with pretty good insight  Past Psychiatric History: Longstanding mood problems.  Has had a couple of hospitalizations recently.  Ever since losing his long-time companion and being homeless his mood problems have been more out of control.  Does have a past history of self-injury and suicide attempts.  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 echo 5/17: EF 60-65%, no RWMA, LV dias fxn nl, LA mildly dilated, RV sys fxn nl, PASP nl, moderate sized circumferential pericardial effusion was identified, 2.12 cm around the LV free wall, <1 cm around the RV free wall. Features were not c/w tamponade physiology   PTSD (post-traumatic stress disorder)    Witnessed brother's suicide.   Restless leg syndrome    Syncope     Past Surgical History:  Procedure Laterality Date   CYSTOSCOPY WITH URETEROSCOPY AND STENT PLACEMENT     ESOPHAGOGASTRODUODENOSCOPY N/A 01/11/2016   Procedure: ESOPHAGOGASTRODUODENOSCOPY (EGD);  Surgeon: Charlott RakesVincent Schooler, MD;  Location: [facility] ENDOSCOPY;  Service: Endoscopy;  Laterality: N/A;   ESOPHAGOGASTRODUODENOSCOPY N/A 04/09/2020   Procedure: ESOPHAGOGASTRODUODENOSCOPY (EGD);  Surgeon: [name], MD;  Location: [facility] ENDOSCOPY;  Service: Gastroenterology;  Laterality: N/A;   INCISION AND DRAINAGE ABSCESS Left 01/02/2018   Procedure: INCISION AND DRAINAGE LEFT KNEE;  Surgeon: Deeann Saint, MD;  Location: [facility] ORS;  Service: Orthopedics;  Laterality: Left;   JOINT REPLACEMENT Right    TKR   KNEE ARTHROSCOPY Right 06/25/2014   Procedure:  ARTHROSCOPY KNEE;  Surgeon: Deeann Saint, MD;  Location: [facility] ORS;  Service: Orthopedics;  Laterality: Right;  partial arthroscopic medial menisectomy   LAPAROSCOPIC APPENDECTOMY N/A 06/02/2021   Procedure: APPENDECTOMY LAPAROSCOPIC;  Surgeon: Campbell Lerner, MD;  Location: [facility] ORS;  Service: General;  Laterality: N/A;   TOTAL KNEE ARTHROPLASTY Right 04/22/2015   Procedure: TOTAL KNEE ARTHROPLASTY;  Surgeon: Deeann Saint, MD;  Location: [facility] ORS;  Service: Orthopedics;  Laterality: Right;   TOTAL KNEE ARTHROPLASTY Left 10/30/2017   Procedure: TOTAL KNEE ARTHROPLASTY;  Surgeon: Deeann Saint, MD;  Location: [facility] ORS;  Service: Orthopedics;  Laterality: Left;   TOTAL KNEE REVISION Left 01/02/2018   Procedure: poly exchange of tibia and patella left knee;  Surgeon: Deeann Saint, MD;  Location: [facility] ORS;  Service: Orthopedics;  Laterality: Left;   UMBILICAL HERNIA REPAIR  06/02/2021   Procedure: HERNIA REPAIR UMBILICAL ADULT;  Surgeon: Campbell Lerner, MD;  Location: [facility] ORS;  Service: General;;   Family History:  Family History  Problem Relation Age of Onset   CVA Mother        deceased at age 32   Depression Brother        Died by suicide at age 78   Family Psychiatric  History: See previous Social History:  Social History   Substance and Sexual Activity  Alcohol Use Yes     Social History   Substance and Sexual Activity  Drug Use Yes   Types: Marijuana    Social History   Socioeconomic History   Marital status: Single    Spouse name: Not on file   Number of children: Not on file   Years of education: Not on file   Highest education level: Not on file  Occupational History   Not on file  Tobacco Use   Smoking status: Former    Packs/day: 0.75    Years: 20.00    Pack years: 15.00    Types: Cigarettes    Quit date: 05/16/1984    Years since quitting: 37.0   Smokeless tobacco: Never  Vaping Use   Vaping Use: Never used  Substance and Sexual Activity   Alcohol  use: Yes   Drug use: Yes    Types: Marijuana   Sexual activity: Not on file  Other Topics Concern   Not on file  Social History Narrative   Not on file   Social Determinants of Health   Financial Resource Strain: Not on file  Food Insecurity: Not on file  Transportation Needs: Not on file  Physical Activity: Not on file  Stress: Not on file  Social Connections: Not on file   Additional Social History:    Allergies:  No Active Allergies  Labs:  Results for orders placed or performed during the hospital encounter of 05/29/21 (from the past 48 hour(s))  Creatinine, serum     Status: None   Collection Time: 06/02/21  3:43 AM  Result Value Ref Range   Creatinine, Ser 0.93 0.61 - 1.24 mg/dL  GFR, Estimated >60 >60 mL/min    Comment: (NOTE) Calculated using the CKD-EPI Creatinine Equation (2021) Performed at [facility], [address] [place], [place] [address]   CBC     Status: Abnormal   Collection Time: 06/03/21  5:36 AM  Result Value Ref Range   WBC 9.2 4.0 - 10.5 K/uL   RBC 4.45 4.22 - 5.81 MIL/uL   Hemoglobin 13.0 13.0 - 17.0 g/dL   HCT 34.7 (L) 42.5 - 95.6 %   MCV 87.2 80.0 - 100.0 fL   MCH 29.2 26.0 - 34.0 pg   MCHC 33.5 30.0 - 36.0 g/dL   RDW 38.7 56.4 - 33.2 %   Platelets 247 150 - 400 K/uL   nRBC 0.0 0.0 - 0.2 %    Comment: Performed at [facility], [address], [place], [place] [address]    Current Facility-Administered Medications  Medication Dose Route Frequency Provider Last Rate Last Admin   0.9 %  sodium chloride infusion   Intravenous PRN Lurene Shadow, MD 10 mL/hr at 06/03/21 0538 New Bag at 06/03/21 0538   acetaminophen (TYLENOL) tablet 1,000 mg  1,000 mg Oral Q8H Campbell Lerner, MD   1,000 mg at 06/02/21 1442   Or   acetaminophen (TYLENOL) suppository 650 mg  650 mg Rectal Q8H Campbell Lerner, MD       alum & mag hydroxide-simeth (MAALOX/MYLANTA) 200-200-20 MG/5ML suspension 15 mL  15 mL Oral Q6H PRN Campbell Lerner, MD   15 mL at 06/02/21 1442   bisacodyl (DULCOLAX) suppository 10 mg  10 mg Rectal Daily PRN Campbell Lerner, MD       cefadroxil (DURICEF) capsule 500 mg  500 mg Oral BID Lurene Shadow, MD       doxepin (SINEQUAN) capsule 100 mg  100 mg Oral QHS Campbell Lerner, MD   100 mg at 06/01/21 2107   enalapril (VASOTEC) tablet 10 mg  10 mg Oral Daily Campbell Lerner, MD   10 mg at 06/03/21 0847   enoxaparin (LOVENOX) injection 40 mg  40 mg Subcutaneous Q24H Campbell Lerner, MD   40 mg at 06/01/21 2017   famotidine (PEPCID) tablet 20 mg  20 mg Oral Daily Campbell Lerner, MD   20 mg at 06/03/21 0844   FLUoxetine (PROZAC) capsule 60 mg  60 mg Oral Daily Campbell Lerner, MD   60 mg at 06/03/21 0847   gabapentin (NEURONTIN) capsule 400 mg  400 mg Oral TID Campbell Lerner, MD   400 mg at 06/03/21 0844   ibuprofen (ADVIL) tablet 400 mg  400 mg Oral Q8H Campbell Lerner, MD   400 mg at 06/02/21 1442   magnesium hydroxide (MILK OF MAGNESIA) suspension 30 mL  30 mL Oral Daily PRN Campbell Lerner, MD       morphine (PF) 4 MG/ML injection 4 mg  4 mg Intravenous Q4H PRN Campbell Lerner, MD   4 mg at 06/02/21 1838   naltrexone (DEPADE) tablet 50 mg  50 mg Oral Daily Campbell Lerner, MD   50 mg at 06/03/21 0848   OLANZapine (ZYPREXA) tablet 10 mg  10 mg Oral QHS Campbell Lerner, MD   10 mg at 06/03/21 0321   OLANZapine (ZYPREXA) tablet 5 mg  5 mg Oral Daily Campbell Lerner, MD   5 mg at 06/03/21 0843   ondansetron (ZOFRAN) tablet 4 mg  4 mg Oral Q6H PRN Campbell Lerner, MD       Or   ondansetron [facility]) injection 4 mg  4 mg Intravenous Q6H PRN Campbell Lerner, MD       oxyCODONE (Oxy IR/ROXICODONE) immediate release tablet 5 mg  5 mg Oral Q4H PRN Campbell Lerner, MD   5 mg at 06/03/21 0844   pantoprazole (PROTONIX) EC tablet 40 mg  40 mg Oral Daily Campbell Lerner, MD   40 mg at 06/03/21 0843   polyethylene glycol (MIRALAX / GLYCOLAX) packet 17 g  17 g Oral Daily Campbell Lerner, MD   17 g at  06/03/21 0845   senna-docusate (Senokot-S) tablet 1 tablet  1 tablet Oral QHS Campbell Lerner, MD       traZODone (DESYREL) tablet 25 mg  25 mg Oral QHS PRN Campbell Lerner, MD   25 mg at 05/31/21 2254   traZODone (DESYREL) tablet 25 mg  25 mg Oral TID PRN Campbell Lerner, MD   25 mg at 05/30/21 1013    Musculoskeletal: Strength & Muscle Tone: within normal limits Gait & Station: normal Patient leans: N/A            Psychiatric Specialty Exam:  Presentation  General Appearance: Disheveled; Casual  Eye Contact:Fair  Speech:Clear and Coherent  Speech Volume:Normal  Handedness:Right   Mood and Affect  Mood:Anxious; Depressed; Hopeless  Affect:Appropriate; Congruent; Depressed   Thought Process  Thought Processes:Coherent  Descriptions of Associations:Intact  Orientation:Full (Time, Place and Person)  Thought Content:Logical; WDL  History of Schizophrenia/Schizoaffective disorder:No  Duration of Psychotic Symptoms:N/A  Hallucinations:No data recorded Ideas of Reference:None  Suicidal Thoughts:No data recorded Homicidal Thoughts:No data recorded  Sensorium  Memory:Immediate Fair  Judgment:Impaired  Insight:Fair   Executive Functions  Concentration:Fair  Attention Span:Fair  Recall:Fair  Fund of Knowledge:Fair  Language:Fair   Psychomotor Activity  Psychomotor Activity:No data recorded  Assets  Assets:Communication Skills; Desire for Improvement   Sleep  Sleep:No data recorded  Physical Exam: Physical Exam Vitals and nursing note reviewed.  Constitutional:      Appearance: Normal appearance.  HENT:     Head: Normocephalic and atraumatic.     Mouth/Throat:     Pharynx: Oropharynx is clear.  Eyes:     Pupils: Pupils are equal, round, and reactive to light.  Cardiovascular:     Rate and Rhythm: Normal rate and regular rhythm.  Pulmonary:     Effort: Pulmonary effort is normal.     Breath sounds: Normal breath sounds.   Abdominal:     General: Abdomen is flat.     Palpations: Abdomen is soft.  Musculoskeletal:        General: Normal range of motion.  Skin:    General: Skin is warm and dry.       Neurological:     General: No focal deficit present.     Mental Status: He is alert. Mental status is at baseline.  Psychiatric:        Attention and Perception: Attention normal.        Mood and Affect: Mood is anxious and depressed.        Speech: Speech normal.        Behavior: Behavior normal.        Thought Content: Thought content normal.        Cognition and Memory: Cognition normal.        Judgment: Judgment is impulsive.   Review of Systems  Constitutional: Negative.   HENT: Negative.    Eyes: Negative.   Respiratory: Negative.    Cardiovascular: Negative.   Gastrointestinal: Negative.   Musculoskeletal: Negative.   Skin:  Negative.   Neurological: Negative.   Psychiatric/Behavioral:  Positive for depression and suicidal ideas. Negative for hallucinations and substance abuse. The patient is nervous/anxious.   Blood pressure (!) 148/95, pulse 91, temperature 97.8 F (36.6 C), temperature source Oral, resp. rate 18, height 5\' 8"  (1.727 m), weight 81.6 kg, SpO2 93 %. Body mass index is 27.37 kg/m.  Treatment Plan Summary: Medication management and Plan patient was pleasant calm and insightful but continues to report having intrusive suicidal thoughts.  I think he would be appropriate for admission to the psychiatry ward.  Although he is 84 he is in otherwise good health and I think would be fine to bring to the adult ward.  I have reviewed this with the treatment team and with nursing on the psychiatry ward.  We will see if we can get him transferred downstairs as soon as possible.  Patient was agreeable to this.  I specifically asked him if we transferred him to psychiatry could he promised that he would not act on any suicidal thoughts but instead would talk to staff and he said he would promise  that.  Disposition: Recommend psychiatric Inpatient admission when medically cleared.  43, MD 06/03/2021 1:13 PM

## 2021-06-03 NOTE — Anesthesia Procedure Notes (Signed)
Procedure Name: Intubation Date/Time: 06/03/2021 12:51 AM Performed by: Lendon Colonel, CRNA Pre-anesthesia Checklist: Patient identified, Patient being monitored, Timeout performed, Emergency Drugs available and Suction available Patient Re-evaluated:Patient Re-evaluated prior to induction Oxygen Delivery Method: Circle system utilized Preoxygenation: Pre-oxygenation with 100% oxygen Induction Type: IV induction Ventilation: Mask ventilation without difficulty Laryngoscope Size: 3 and Glidescope Grade View: Grade I Tube type: Oral Tube size: 7.0 mm Number of attempts: 1 Airway Equipment and Method: Stylet Placement Confirmation: ETT inserted through vocal cords under direct vision, positive ETCO2 and breath sounds checked- equal and bilateral Secured at: 21 cm Tube secured with: Tape Dental Injury: Teeth and Oropharynx as per pre-operative assessment

## 2021-06-03 NOTE — Progress Notes (Addendum)
Progress Note    [name]  [id number] DOB: 1960/09/26  DOA: 05/29/2021 PCP: Ranae Plumber, PA      Brief Narrative:    Medical records reviewed and are as summarized below:   61 y.o. male with PMHx of bipolar disorder, anxiety, GERD, hypertension, urolithiasis and PTSD, who presented to the ER on 05/29/2021 with worsening right hand swelling and pain that started swelling a couple weeks ago after having a piece of glass on his third MCP.  He also reported that he had started having swelling in his left knee 4-5 days prior.  Noted hx of where he had his left TKA in [id number] that was complicated then by infection with septic arthritis and required washout procedure.  He [place] seen in the ER 3 days prior to this presentation for his left knee pain and was discharged on Keflex and Bactrim after negative work-up.   Orthopedic surgery was consulted who recommended admission for IV antibiotics and close monitoring for both the left knee effusion and right hand cellulitis with associated third MCP tenosynovitis.  He complained of right lower quadrant abdominal pain while hospitalized..  CT abdomen pelvis revealed acute appendicitis. He was started on empiric IV antibiotics for this.      Assessment/Plan:   Principal Problem:   Bipolar disorder ([place]) Active Problems:   Cellulitis of right hand   Effusion of bursa of left knee   Essential hypertension   Depression   Acute appendicitis    Body mass index is 27.37 kg/m.  Right hand cellulitis, right third finger extensor tendon tenosynovitis, left knee effusion: S/p I&D of right hand abscess on 05/31/2021.  Wound culture from right hand showed MSSA.  Discontinue IV vancomycin and start oral cefadroxil  Of note, left knee fluid culture from 05/27/2021 did not show any growth.  Acute appendicitis: S/p laparoscopic appendectomy on 06/03/2021.  Analgesics as needed for pain.  Appreciate assistance from Dr. Christian Mate, general  surgeon.  General surgery signed off.    Constipation noted on CT scan: Continue laxatives as needed  Bipolar disorder, depression with suicidal ideation: Consulted Dr. Weber Cooks, psychiatrist.  Plan to transfer to behavioral health unit.  There are no beds available at the moment.  Continue psychotropics and one-to-one watch for safety.  Hypertension: Continue enalapril  1.3 cm right lower pole kidney mass: Outpatient follow-up with ultrasound in 6 months recommended for surveillance.    Diet Order             Diet clear liquid Room service appropriate? Yes; Fluid consistency: Thin  Diet effective now                          Consultants: Orthopedic surgeon, general surgeon  Procedures: I&D of right hand cellulitis/abscess on 05/31/2021    Medications:    acetaminophen  1,000 mg Oral Q8H   Or   acetaminophen  650 mg Rectal Q8H   cefadroxil  500 mg Oral BID   doxepin  100 mg Oral QHS   enalapril  10 mg Oral Daily   enoxaparin (LOVENOX) injection  40 mg Subcutaneous Q24H   famotidine  20 mg Oral Daily   FLUoxetine  60 mg Oral Daily   gabapentin  400 mg Oral TID   ibuprofen  400 mg Oral Q8H   naltrexone  50 mg Oral Daily   OLANZapine  10 mg Oral QHS   OLANZapine  5 mg Oral Daily  pantoprazole  40 mg Oral Daily   polyethylene glycol  17 g Oral Daily   senna-docusate  1 tablet Oral QHS   Continuous Infusions:  sodium chloride 10 mL/hr at 06/03/21 0538     Anti-infectives (From admission, onward)    Start     Dose/Rate Route Frequency Ordered Stop   06/03/21 2200  cefadroxil (DURICEF) capsule 500 mg        500 mg Oral 2 times daily 06/03/21 1040 06/12/21 0959   06/02/21 2200  cefTRIAXone (ROCEPHIN) 2 g in sodium chloride 0.9 % 100 mL IVPB  Status:  Discontinued        2 g 200 mL/hr over 30 Minutes Intravenous Every 24 hours 06/02/21 1639 06/03/21 1040   06/02/21 1800  metroNIDAZOLE (FLAGYL) IVPB 500 mg  Status:  Discontinued        500 mg 100 mL/hr  over 60 Minutes Intravenous Every 12 hours 06/02/21 1639 06/03/21 1040   05/30/21 1700  vancomycin (VANCOREADY) IVPB 1750 mg/350 mL  Status:  Discontinued        1,750 mg 175 mL/hr over 120 Minutes Intravenous Every 24 hours 05/30/21 0142 05/30/21 0733   05/30/21 1000  vancomycin (VANCOCIN) IVPB 1000 mg/200 mL premix  Status:  Discontinued        1,000 mg 200 mL/hr over 60 Minutes Intravenous Every 12 hours 05/30/21 0733 06/03/21 1040   05/30/21 0600  ceFEPIme (MAXIPIME) 2 g in sodium chloride 0.9 % 100 mL IVPB  Status:  Discontinued        2 g 200 mL/hr over 30 Minutes Intravenous Every 8 hours 05/30/21 0123 06/02/21 1555   05/30/21 0230  vancomycin (VANCOREADY) IVPB 1250 mg/250 mL        1,250 mg 166.7 mL/hr over 90 Minutes Intravenous  Once 05/30/21 0142 05/30/21 0336   05/30/21 0215  vancomycin (VANCOCIN) IVPB 1000 mg/200 mL premix  Status:  Discontinued        1,000 mg 200 mL/hr over 60 Minutes Intravenous  Once 05/30/21 0123 05/30/21 0143   05/29/21 1600  cefTRIAXone (ROCEPHIN) 1 g in sodium chloride 0.9 % 100 mL IVPB        1 g 200 mL/hr over 30 Minutes Intravenous  Once 05/29/21 1547 05/29/21 1657   05/29/21 1600  vancomycin (VANCOCIN) IVPB 1000 mg/200 mL premix        1,000 mg 200 mL/hr over 60 Minutes Intravenous  Once 05/29/21 1547 05/29/21 1806              Family Communication/Anticipated D/C date and plan/Code Status   DVT prophylaxis: enoxaparin (LOVENOX) injection 40 mg Start: 05/29/21 2200     Code Status: Full Code  Family Communication: None Disposition Plan: Plan to discharge to behavioral health unit in 1 to 2 days   Status is: Inpatient Remains inpatient appropriate because: Awaiting placement to behavioral health unit      Subjective:   Interval events noted.  He complains of pain in the right side of the lower abdomen.  He had expressed suicidal thoughts to PACU nurse yesterday.  He said he did not want to talk about it again.  He has a  Actuary at the bedside for safety.  Objective:    Vitals:   06/03/21 0200 06/03/21 0215 06/03/21 0243 06/03/21 0809  BP: 136/89 128/89 129/79 (!) 148/95  Pulse: 85 85 78 91  Resp: 18 14 14 18   Temp: 98.4 F (36.9 C)  98.1 F (36.7 C) 97.8 F (36.6  C)  TempSrc:    Oral  SpO2: 93% 94% 94% 93%  Weight:      Height:       No data found.   Intake/Output Summary (Last 24 hours) at 06/03/2021 1404 Last data filed at 06/03/2021 1300 Gross per 24 hour  Intake 2360 ml  Output 0 ml  Net 2360 ml   Filed Weights   05/29/21 1253  Weight: 81.6 kg    Exam:  GEN: NAD SKIN: Surgical wound on right dorsal wrist looks clean and dry without drainage EYES: No pallor or icterus ENT: MMM CV: RRR PULM: CTA B ABD: soft, ND, right lower quadrant tenderness without rebound tenderness or guarding, +BS, trocar incisions around the umbilicus and the left lower quadrant.  Site intact CNS: AAO x 3, non focal EXT: No leg edema or tenderness.  Some swelling of the left knee without erythema or tenderness PSYCH: Depressed        Data Reviewed:   I have personally reviewed following labs and imaging studies:  Labs: Labs show the following:   Basic Metabolic Panel: Recent Labs  Lab 05/29/21 1255 05/30/21 0512 05/31/21 1038 06/02/21 0343  NA 138 138 140  --   K 4.0 4.0 4.4  --   CL 104 109 109  --   CO2 25 24 24   --   GLUCOSE 137* 98 85  --   BUN 14 14 16   --   CREATININE 1.11 1.02 1.00 0.93  CALCIUM 9.9 8.8* 8.7*  --    GFR Estimated Creatinine Clearance: 83.8 mL/min (by C-G formula based on SCr of 0.93 mg/dL). Liver Function Tests: Recent Labs  Lab 05/29/21 1255  AST 24  ALT 27  ALKPHOS 69  BILITOT 0.6  PROT 7.3  ALBUMIN 3.8   No results for input(s): LIPASE, AMYLASE in the last 168 hours. No results for input(s): AMMONIA in the last 168 hours. Coagulation profile No results for input(s): INR, PROTIME in the last 168 hours.  CBC: Recent Labs  Lab 05/29/21 1255  05/30/21 0512 06/03/21 0536  WBC 6.1 5.3 9.2  NEUTROABS 4.2  --   --   HGB 12.6* 11.2* 13.0  HCT 39.4 34.1* 38.8*  MCV 88.9 89.3 87.2  PLT 261 230 247   Cardiac Enzymes: No results for input(s): CKTOTAL, CKMB, CKMBINDEX, TROPONINI in the last 168 hours. BNP (last 3 results) No results for input(s): PROBNP in the last 8760 hours. CBG: No results for input(s): GLUCAP in the last 168 hours. D-Dimer: No results for input(s): DDIMER in the last 72 hours. Hgb A1c: No results for input(s): HGBA1C in the last 72 hours. Lipid Profile: No results for input(s): CHOL, HDL, LDLCALC, TRIG, CHOLHDL, LDLDIRECT in the last 72 hours. Thyroid function studies: No results for input(s): TSH, T4TOTAL, T3FREE, THYROIDAB in the last 72 hours.  Invalid input(s): FREET3 Anemia work up: No results for input(s): VITAMINB12, FOLATE, FERRITIN, TIBC, IRON, RETICCTPCT in the last 72 hours. Sepsis Labs: Recent Labs  Lab 05/29/21 1255 05/29/21 1500 05/30/21 0512 06/03/21 0536  WBC 6.1  --  5.3 9.2  LATICACIDVEN 2.2* 1.3  --   --     Microbiology Recent Results (from the past 240 hour(s))  Body fluid culture w Gram Stain     Status: None   Collection Time: 05/27/21  4:12 PM   Specimen: Synovium; Body Fluid  Result Value Ref Range Status   Specimen Description   Final    SYNOVIAL L KNEE Performed at  [facility], [address], [place], [place] [address]    Special Requests   Final    Normal Performed at [facility], [place], [place] [address]    Gram Stain   Final    NO SQUAMOUS EPITHELIAL CELLS SEEN FEW WBC SEEN NO ORGANISMS SEEN    Culture   Final    NO GROWTH 3 DAYS Performed at [facility], [place] [address] [place], [place] [address]    Report Status 05/31/2021 FINAL  Final  Culture, blood (routine x 2)     Status: None   Collection Time: 05/29/21  3:42 PM   Specimen: BLOOD  Result Value Ref Range Status   Specimen Description BLOOD LFOA   Final   Special Requests BOTTLES DRAWN AEROBIC AND ANAEROBIC BCAV  Final   Culture   Final    NO GROWTH 5 DAYS Performed at [facility], [address], [place], [place] [address]    Report Status 06/03/2021 FINAL  Final  Culture, blood (routine x 2)     Status: None   Collection Time: 05/29/21  3:47 PM   Specimen: BLOOD  Result Value Ref Range Status   Specimen Description BLOOD BRH  Final   Special Requests BOTTLES DRAWN AEROBIC AND ANAEROBIC BCLV  Final   Culture   Final    NO GROWTH 5 DAYS Performed at [facility], [address] [place], [place] [address]    Report Status 06/03/2021 FINAL  Final  Chlamydia/NGC rt PCR ([place] only)     Status: None   Collection Time: 05/29/21  7:06 PM   Specimen: Urine  Result Value Ref Range Status   Specimen source GC/Chlam URINE, RANDOM  Final   Chlamydia Tr NOT DETECTED NOT DETECTED Final   N gonorrhoeae NOT DETECTED NOT DETECTED Final    Comment: (NOTE) This CT/NG assay has not been evaluated in patients with a history of  hysterectomy. Performed at [facility], [address] [place], [place] [address]   Aerobic/Anaerobic Culture w Gram Stain (surgical/deep wound)     Status: None (Preliminary result)   Collection Time: 05/31/21  2:54 PM   Specimen: Wound  Result Value Ref Range Status   Specimen Description   Final    WOUND WD Performed at [facility], [address], [place], [place] [address]    Special Requests   Final    NONE Performed at [facility], [address] [place], [place] [address]    Gram Stain   Final    FEW WBC PRESENT, PREDOMINANTLY PMN NO ORGANISMS SEEN Performed at [facility], [place] [address] [place],  [address]    Culture   Final    RARE STAPHYLOCOCCUS AUREUS NO ANAEROBES ISOLATED; CULTURE IN PROGRESS FOR 5 DAYS    Report Status PENDING  Incomplete   Organism ID, Bacteria STAPHYLOCOCCUS AUREUS  Final      Susceptibility    Staphylococcus aureus - MIC*    CIPROFLOXACIN <=0.5 SENSITIVE Sensitive     ERYTHROMYCIN <=0.25 SENSITIVE Sensitive     GENTAMICIN <=0.5 SENSITIVE Sensitive     OXACILLIN 0.5 SENSITIVE Sensitive     TETRACYCLINE <=1 SENSITIVE Sensitive     VANCOMYCIN <=0.5 SENSITIVE Sensitive     TRIMETH/SULFA <=10 SENSITIVE Sensitive     CLINDAMYCIN <=0.25 SENSITIVE Sensitive     RIFAMPIN <=0.5 SENSITIVE Sensitive     Inducible Clindamycin NEGATIVE Sensitive     *  RARE STAPHYLOCOCCUS AUREUS    Procedures and diagnostic studies:  CT ABDOMEN PELVIS W CONTRAST  Result Date: 06/02/2021 CLINICAL DATA:  Right lower quadrant abdominal pain. EXAM: CT ABDOMEN AND PELVIS WITH CONTRAST TECHNIQUE: Multidetector CT imaging of the abdomen and pelvis was performed using the standard protocol following bolus administration of intravenous contrast. RADIATION DOSE REDUCTION: This exam was performed according to the departmental dose-optimization program which includes automated exposure control, adjustment of the mA and/or kV according to patient size and/or use of iterative reconstruction technique. CONTRAST:  110mL OMNIPAQUE IOHEXOL 300 MG/ML  SOLN COMPARISON:  CT abdomen and pelvis 04/06/2020 and 01/27/2016; MRI abdomen 04/07/2020 FINDINGS: Lower chest: Mild motion artifact limits evaluation of the lung bases. No gross abnormality. Hepatobiliary: Smooth liver contours. Mildly decreased density throughout the liver suggesting fatty infiltration. Tiny low-density focus adjacent to the gallbladder fossa unchanged and possibly a tiny cyst. The gallbladder is unremarkable. Pancreas: Unremarkable. No pancreatic ductal dilatation or surrounding inflammatory changes. Spleen: Normal in size without focal abnormality. Adrenals/Urinary Tract: Normal adrenals. The kidneys enhance uniformly and are symmetrically chicken size without hydronephrosis. There multiple small low-attenuation lesions within the bilateral kidneys. Stable 13 mm  low-attenuation lesion within the posterior inferior right kidney (axial series 2, image 41) demonstrates internal density of 28 Hounsfield units. Again this may represent a solid mass, and renal cell carcinoma cannot be excluded. This is again increased in size from approximately 8 mm on 01/27/2016 CT. Left lower pole 1.4 cm (axial image 42) fluid attenuation cyst is slightly increased from 11 mm previously. Just anterior to this, there is a 12 mm low-attenuation lesion with internal density of 14 Hounsfield units, previously 11 mm, compatible with an additional cyst. No focal urinary bladder wall thickening is seen. Stomach/Bowel: There is high-grade stool seen throughout all portions of the colon increased from prior and suggesting constipation. The terminal ileum is unremarkable. The appendix is enlarged measuring up to 15 mm in caliber, and demonstrates mild-to-moderate surrounding inflammatory fat stranding and wall thickening. There are 2 mid to distal appendiceal appendicoliths, with the larger more proximal appendicolith measuring up to 7 mm (axial series 2 images 57 through 63). No dilated loops of bowel to indicate bowel obstruction. Vascular/Lymphatic: Mild atherosclerotic calcifications. No abdominal aortic aneurysm. The major intra-abdominal aortic branch vessels are patent. Reproductive: The prostate and seminal vesicles are unremarkable. Other: There is a fat containing umbilical hernia measuring up to 3.0 cm in transverse dimension similar to prior. No free air or free fluid is seen within the abdomen or pelvis. Musculoskeletal: No acute skeletal abnormality. IMPRESSION: 1. Acute appendicitis.  No perforation or abscess is seen. 2. Within the posterior lower pole of the right kidney there is a 13 mm low-attenuation lesion of the is not qualify as a simple cyst. This is stable in size from 04/06/2020 CT but increased in size from 01/27/2016 CT. This remains indeterminate for a complex cyst versus  solid mass on 04/07/2020 MRI. The stability over the past 13 months is reassuring for a benign process. Consider follow-up ultrasound in 6 months for surveillance. 3. High-grade stool throughout all portions of the colon suggesting constipation. Electronically Signed   By: Yvonne Kendall M.D.   On: 06/02/2021 15:18               LOS: 5 days   Galdino Hinchman  [facility]   Pager on [email or web address]. If 7PM-7AM, please contact night-coverage at [email or web address]     06/03/2021, 2:04 PM

## 2021-06-03 NOTE — Transfer of Care (Signed)
Immediate Anesthesia Transfer of Care Note  Patient: [name]  Procedure(s) Performed: APPENDECTOMY LAPAROSCOPIC (Abdomen) HERNIA REPAIR UMBILICAL ADULT  Patient Location: PACU  Anesthesia Type:General  Level of Consciousness: sedated and patient cooperative  Airway & Oxygen Therapy: Patient Spontanous Breathing and Patient connected to face mask oxygen  Post-op Assessment: Report given to RN and Post -op Vital signs reviewed and stable  Post vital signs: Reviewed and stable  Last Vitals:  Vitals Value Taken Time  BP 133/90 06/03/21 0130  Temp    Pulse 86 06/03/21 0131  Resp 17 06/03/21 0131  SpO2 96 % 06/03/21 0131  Vitals shown include unvalidated device data.  Last Pain:  Vitals:   06/02/21 2026  TempSrc:   PainSc: 10-Worst pain ever      Patients Stated Pain Goal: 2 (06/02/21 2026)  Complications: No notable events documented.

## 2021-06-03 NOTE — Discharge Instructions (Signed)
Your incision was closed with Dermabond.  It is best to keep it clean and dry, it will tolerate a brief shower, but do not soak it or apply any creams or lotions to the incisions.  The Dermabond should gradually flake off over time.  Keep it open to air so you can evaluate your incisions.  Dermabond assists the underlying sutures to keep your incision closed and protected from infection.  Should you develop some drainage from your incision, some drops of drainage would be okay but if it persists continue to put keep a dry dressing over it.  

## 2021-06-03 NOTE — Progress Notes (Addendum)
Patient returned from PACU. While in PACU patient stated to PACU RN that he was having suicidal thoughts without plans to harm himself at this present time. MD carol Margo Aye notified and new orders placed. 1:1 safety sitter at bedside to ensure patient safety.

## 2021-06-03 NOTE — Op Note (Signed)
Laparascopic appendectomy   [name] Colon Graumann Date of operation:  06/03/2021  Indications: The patient presented with a history of  abdominal pain. Workup has revealed findings consistent with acute appendicitis.    Pre-operative Diagnosis: Acute appendicitis without mention of peritonitis Umbilical hernia 1.5- 2cm est. diameter fascial defect.   Post-operative Diagnosis: Same  Surgeon: Campbell Lerner, M.D., FACS  Anesthesia: General with endotracheal tube  Findings: Firm appendiceal base at cecum.   Estimated Blood Loss: 63mL          Specimens: appendix         Complications:  none  Procedure Details  The patient was seen again in the preop area. The options of surgery versus observation were reviewed with the patient and/or family. The risks of bleeding, infection, recurrence of symptoms, negative laparoscopy, potential for an open procedure, bowel injury, abscess or infection, were all reviewed as well. The patient was taken to Operating Room, identified as [name] and the procedure verified as laparoscopic appendectomy. A Time Out was held and the above information confirmed. The patient was placed in the supine position and general anesthesia was induced.  Antibiotic prophylaxis was administered pre-op and VT E prophylaxis was in place.   The abdomen was prepped and draped in a sterile fashion. Local infiltration with 0.25% Marcaine with epi is administered to all incisions.  An infraumbilical incision was made, and a 12 mm optical trocar was passed into the peritoneal cavity via the defect under direct visualization.  Pneumoperitoneum obtained. One 5 mm port in the LLQ, and another 5 mm port, suprapubic were placed under direct visualization.  The appendix was identified.  The appendix was carefully dissected. The mesoappendix was divided with Harmonic scalpel. The base of the appendix was dissected out and divided with a 45 mm white load Endo GIA.The appendix was  placed in a Endo Catch bag and removed via the 12 mm port. The right lower quadrant and pelvis was then irrigated with  normal saline which was aspirated. Inspection  failed to identify any additional bleeding and there were no signs of bowel injury. Again the right lower quadrant was inspected there was no sign of bleeding or bowel injury. The umbilical 1.5+ cm defect of  fascia was closed with 0 Ethibond in a vest-over-pants fashion to close the large defect. Pneumoperitoneum restored for one internal look, then was released, all ports were removed, and the skin incisions were approximated with subcuticular 4-0 Monocryl. Dermabond was applied to umbilicus.  Gauze and tegaderm to the 5 mm ports sites still draining.  The patient tolerated the procedure well, there were no complications. The sponge lap and needle count were correct at the end of the procedure.  The patient was taken to the recovery room in stable condition to be discharged to home, when appropriate.   Campbell Lerner, M.D., FACS 06/03/2021 - 1:20 AM

## 2021-06-03 NOTE — Progress Notes (Signed)
[facility] SURGICAL PROGRESS NOTE  Hospital Day(s): 5.   Post op day(s): 1 Day Post-Op.   Interval History: Patient seen and examined, no acute events or new complaints overnight. Patient reports pain remarkably improved, as expected incisional areas.  Has not had much orally yet.  Review of Systems:  Constitutional: denies fever, chills  Respiratory: denies any shortness of breath  Cardiovascular: denies chest pain or palpitations  Gastrointestinal: Denies N/V, or diarrhea/and bowel function as per interval history Musculoskeletal: denies pain, decreased motor or sensation Integumentary: denies any other rashes or skin discolorations  Vital signs in last 24 hours: [min-max] current  Temp:  [97.6 F (36.4 C)-98.7 F (37.1 C)] 97.8 F (36.6 C) (05/25 0809) Pulse Rate:  [78-92] 91 (05/25 0809) Resp:  [11-18] 18 (05/25 0809) BP: (128-151)/(79-97) 148/95 (05/25 0809) SpO2:  [93 %-97 %] 93 % (05/25 0809)     Height: 5\' 8"  (172.7 cm) Weight: 81.6 kg BMI (Calculated): 27.38   Intake/Output last 2 shifts:  05/24 0701 - 05/25 0700 In: 1640 [P.O.:140; I.V.:1000; IV Piggyback:500] Out: 0    Physical Exam:  Constitutional: alert, cooperative and no distress  Respiratory: breathing non-labored at rest  Cardiovascular: regular rate and sinus rhythm  Gastrointestinal: soft, non-tender, and non-distended Integumentary: Umbilical incision ecchymotic.  Suprapubic and left lower quadrant trocar incisions with dressing intact.  Labs:     Latest Ref Rng & Units 06/03/2021    5:36 AM 05/30/2021    5:12 AM 05/29/2021   12:55 PM  CBC  WBC 4.0 - 10.5 K/uL 9.2   5.3   6.1    Hemoglobin 13.0 - 17.0 g/dL 05/31/2021   77.8   24.2    Hematocrit 39.0 - 52.0 % 38.8   34.1   39.4    Platelets 150 - 400 K/uL 247   230   261        Latest Ref Rng & Units 06/02/2021    3:43 AM 05/31/2021   10:38 AM 05/30/2021    5:12 AM  CMP  Glucose 70 - 99 mg/dL  85   98    BUN 6 - 20 mg/dL  16   14     Creatinine 0.61 - 1.24 mg/dL 06/01/2021   6.14   4.31    Sodium 135 - 145 mmol/L  140   138    Potassium 3.5 - 5.1 mmol/L  4.4   4.0    Chloride 98 - 111 mmol/L  109   109    CO2 22 - 32 mmol/L  24   24    Calcium 8.9 - 10.3 mg/dL  8.7   8.8       Imaging studies: No new pertinent imaging studies   Assessment/Plan:  61 y.o. male with  1 Day Post-Op s/p laparoscopic appendectomy for acute appendicitis, complicated by pertinent comorbidities including:.  Patient Active Problem List   Diagnosis Date Noted   Acute appendicitis 06/02/2021   Cellulitis of right hand 05/29/2021   Effusion of bursa of left knee 05/29/2021   Depression 05/29/2021   Major depressive disorder, recurrent episode, moderate (HCC) 05/15/2021   Cluster B personality disorder (HCC) 05/03/2021   Bipolar disorder (HCC) 01/18/2021   Essential hypertension 01/18/2021   Alcohol abuse 01/18/2021    -May advance diet as tolerated, and discharge per primary service when ready.  -I can follow-up this patient in 2 weeks.  Regarding the appendicitis no additional antibiotics needed.  -May remove his dressings when ready  to shower.  -His incision was closed with Dermabond.  It is best to keep it clean and dry, it will tolerate a brief shower, but do not soak it or apply any creams or lotions to the incisions.  The Dermabond should gradually flake off over time.  Keep it open to air so you can evaluate your incisions.  Dermabond assists the underlying sutures to keep your incision closed and protected from infection.  Should you develop some drainage from your incision, some drops of drainage would be okay but if it persists continue to put keep a dry dressing over it.    All of the above findings and recommendations were discussed with the patient, and all of patient's questions were answered to their expressed satisfaction.  -- Campbell Lerner, M.D., [facility] 06/03/2021

## 2021-06-04 DIAGNOSIS — F3132 Bipolar disorder, current episode depressed, moderate: Secondary | ICD-10-CM | POA: Diagnosis not present

## 2021-06-04 DIAGNOSIS — K353 Acute appendicitis with localized peritonitis, without perforation or gangrene: Secondary | ICD-10-CM | POA: Diagnosis not present

## 2021-06-04 DIAGNOSIS — L03113 Cellulitis of right upper limb: Secondary | ICD-10-CM | POA: Diagnosis not present

## 2021-06-04 LAB — SURGICAL PATHOLOGY

## 2021-06-04 MED ORDER — FLEET ENEMA 7-19 GM/118ML RE ENEM: 1.0000 | ENEMA | Freq: Every day | RECTAL | Status: DC | PRN

## 2021-06-04 MED ORDER — MAGNESIUM HYDROXIDE 400 MG/5ML PO SUSP
30.0000 mL | Freq: Every day | ORAL | Status: DC
  Administered 2021-06-04 – 2021-06-08 (×2): 30 mL via ORAL
  Filled 2021-06-04 (×4): qty 30

## 2021-06-04 MED ORDER — BISACODYL 10 MG RE SUPP
10.0000 mg | Freq: Every day | RECTAL | Status: DC
Start: 2021-06-04 — End: 2021-06-09
  Administered 2021-06-04: 10 mg via RECTAL
  Filled 2021-06-04 (×4): qty 1

## 2021-06-04 MED ORDER — OXYCODONE HCL 5 MG PO TABS
10.0000 mg | ORAL_TABLET | ORAL | Status: DC | PRN
  Administered 2021-06-04 – 2021-06-09 (×15): 10 mg via ORAL
  Filled 2021-06-04 (×15): qty 2

## 2021-06-04 NOTE — Plan of Care (Signed)
  Problem: Skin Integrity: Goal: Skin integrity will improve Outcome: Progressing   Problem: Education: Goal: Knowledge of General Education information will improve Description: Including pain rating scale, medication(s)/side effects and non-pharmacologic comfort measures Outcome: Progressing   

## 2021-06-04 NOTE — Progress Notes (Signed)
Progress Note    [name]  ZOX:[id number] DOB: May 13, 1960  DOA: 05/29/2021 PCP: [name], PA      Brief Narrative:    Medical records reviewed and are as summarized below:   61 y.o. male with PMHx of bipolar disorder, anxiety, GERD, hypertension, urolithiasis and PTSD, who presented to the ER on 05/29/2021 with worsening right hand swelling and pain that started swelling a couple weeks ago after having a piece of glass on his third MCP.  He also reported that he had started having swelling in his left knee 4-5 days prior.  Noted hx of where he had his left TKA in 2014 that was complicated then by infection with septic arthritis and required washout procedure.  [place] seen in the ER 3 days prior to this presentation for his left knee pain and was discharged on Keflex and Bactrim after negative work-up.   Orthopedic surgery was consulted who recommended admission for IV antibiotics and close monitoring for both the left knee effusion and right hand cellulitis with associated third MCP tenosynovitis.  He complained of right lower quadrant abdominal pain while hospitalized..  CT abdomen pelvis revealed acute appendicitis. He was started on empiric IV antibiotics for this.      Assessment/Plan:   Principal Problem:   Bipolar disorder (HCC) Active Problems:   Cellulitis of right hand   Effusion of bursa of left knee   Essential hypertension   Depression   Acute appendicitis    Body mass index is 27.37 kg/m.  Right hand cellulitis, right third finger extensor tendon tenosynovitis, left knee effusion: S/p I&D of right hand abscess on 05/31/2021.  Wound culture from right hand showed MSSA.  Plan to complete cefadroxil today.  Of note, left knee fluid culture from 05/27/2021 did not show any growth.  Acute appendicitis: S/p laparoscopic appendectomy on 06/03/2021.  Continue analgesics as needed for pain.  Oxycodone has been increased from 5 to 10 mg for adequate  pain control.   Constipation noted on CT scan: Continue laxatives as needed  Bipolar disorder, depression with suicidal ideation: Continue psychotropics.  Follow-up with Dr. Toni Amend, psychiatrist.  Continue one-to-one watch for safety.  Plan to transfer to behavioral health unit pending bed availability  Hypertension: Continue enalapril  1.3 cm right lower pole kidney mass: Outpatient follow-up with ultrasound in 6 months recommended for surveillance.    Diet Order             Diet regular Room service appropriate? Yes; Fluid consistency: Thin  Diet effective now                          Consultants: Orthopedic surgeon, general surgeon  Procedures: I&D of right hand cellulitis/abscess on 05/31/2021    Medications:    acetaminophen  1,000 mg Oral Q8H   Or   acetaminophen  650 mg Rectal Q8H   bisacodyl  10 mg Rectal Daily   cefadroxil  500 mg Oral BID   doxepin  100 mg Oral QHS   enalapril  10 mg Oral Daily   enoxaparin (LOVENOX) injection  40 mg Subcutaneous Q24H   famotidine  20 mg Oral Daily   FLUoxetine  60 mg Oral Daily   gabapentin  400 mg Oral TID   ibuprofen  400 mg Oral Q8H   magnesium hydroxide  30 mL Oral QHS   naltrexone  50 mg Oral Daily   OLANZapine  10 mg Oral  QHS   OLANZapine  5 mg Oral Daily   pantoprazole  40 mg Oral Daily   polyethylene glycol  17 g Oral Daily   senna-docusate  1 tablet Oral QHS   Continuous Infusions:  sodium chloride 10 mL/hr at 06/03/21 0538     Anti-infectives (From admission, onward)    Start     Dose/Rate Route Frequency Ordered Stop   06/03/21 2200  cefadroxil (DURICEF) capsule 500 mg        500 mg Oral 2 times daily 06/03/21 1040 06/12/21 0959   06/02/21 2200  cefTRIAXone (ROCEPHIN) 2 g in sodium chloride 0.9 % 100 mL IVPB  Status:  Discontinued        2 g 200 mL/hr over 30 Minutes Intravenous Every 24 hours 06/02/21 1639 06/03/21 1040   06/02/21 1800  metroNIDAZOLE (FLAGYL) IVPB 500 mg  Status:   Discontinued        500 mg 100 mL/hr over 60 Minutes Intravenous Every 12 hours 06/02/21 1639 06/03/21 1040   05/30/21 1700  vancomycin (VANCOREADY) IVPB 1750 mg/350 mL  Status:  Discontinued        1,750 mg 175 mL/hr over 120 Minutes Intravenous Every 24 hours 05/30/21 0142 05/30/21 0733   05/30/21 1000  vancomycin (VANCOCIN) IVPB 1000 mg/200 mL premix  Status:  Discontinued        1,000 mg 200 mL/hr over 60 Minutes Intravenous Every 12 hours 05/30/21 0733 06/03/21 1040   05/30/21 0600  ceFEPIme (MAXIPIME) 2 g in sodium chloride 0.9 % 100 mL IVPB  Status:  Discontinued        2 g 200 mL/hr over 30 Minutes Intravenous Every 8 hours 05/30/21 0123 06/02/21 1555   05/30/21 0230  vancomycin (VANCOREADY) IVPB 1250 mg/250 mL        1,250 mg 166.7 mL/hr over 90 Minutes Intravenous  Once 05/30/21 0142 05/30/21 0336   05/30/21 0215  vancomycin (VANCOCIN) IVPB 1000 mg/200 mL premix  Status:  Discontinued        1,000 mg 200 mL/hr over 60 Minutes Intravenous  Once 05/30/21 0123 05/30/21 0143   05/29/21 1600  cefTRIAXone (ROCEPHIN) 1 g in sodium chloride 0.9 % 100 mL IVPB        1 g 200 mL/hr over 30 Minutes Intravenous  Once 05/29/21 1547 05/29/21 1657   05/29/21 1600  vancomycin (VANCOCIN) IVPB 1000 mg/200 mL premix        1,000 mg 200 mL/hr over 60 Minutes Intravenous  Once 05/29/21 1547 05/29/21 1806              Family Communication/Anticipated D/C date and plan/Code Status   DVT prophylaxis: enoxaparin (LOVENOX) injection 40 mg Start: 05/29/21 2200     Code Status: Full Code  Family Communication: None Disposition Plan: Plan to discharge to behavioral health unit today or tomorrow   Status is: Inpatient Remains inpatient appropriate because: Awaiting placement to behavioral health unit       Subjective:   Interval events noted.  He complains of worsening abdominal pain today.  No nausea or vomiting.  Swelling in the left knee has improved.  No pain in the right hand.   He has a Comptroller at the bedside.  Objective:    Vitals:   06/03/21 1941 06/03/21 2255 06/04/21 0539 06/04/21 0809  BP: (!) 157/100 (!) 148/99 122/78 (!) 133/95  Pulse: 90 89 89 78  Resp: Temp: 97.9 F (36.6 C) 97.8 F (36.6 C) 97.6  F (36.4 C) 97.7 F (36.5 C)  TempSrc:    Oral  SpO2: 95% 97% 96% 99%  Weight:      Height:       No data found.   Intake/Output Summary (Last 24 hours) at 06/04/2021 1532 Last data filed at 06/04/2021 1357 Gross per 24 hour  Intake 600 ml  Output 1075 ml  Net -475 ml   Filed Weights   05/29/21 1253  Weight: 81.6 kg    Exam:  GEN: NAD SKIN: Surgical wound on the dorsal aspect of right hand is healing nicely  EYES: EOMI ENT: MMM CV: RRR PULM: CTA B ABD: soft, ND, right and left lower quadrant tenderness without rebound tenderness or guarding, trocar incisions look clean, dry and intact, +BS CNS: AAO x 3, non focal EXT: Left knee swelling has improved. PSYCH: Depressed          Data Reviewed:   I have personally reviewed following labs and imaging studies:  Labs: Labs show the following:   Basic Metabolic Panel: Recent Labs  Lab 05/29/21 1255 05/30/21 0512 05/31/21 1038 06/02/21 0343  NA 138 138 140  --   K 4.0 4.0 4.4  --   CL 104 109 109  --   CO2 25 24 24   --   GLUCOSE 137* 98 85  --   BUN 14 14 16   --   CREATININE 1.11 1.02 1.00 0.93  CALCIUM 9.9 8.8* 8.7*  --    GFR Estimated Creatinine Clearance: 83.8 mL/min (by C-G formula based on SCr of 0.93 mg/dL). Liver Function Tests: Recent Labs  Lab 05/29/21 1255  AST 24  ALT 27  ALKPHOS 69  BILITOT 0.6  PROT 7.3  ALBUMIN 3.8   No results for input(s): LIPASE, AMYLASE in the last 168 hours. No results for input(s): AMMONIA in the last 168 hours. Coagulation profile No results for input(s): INR, PROTIME in the last 168 hours.  CBC: Recent Labs  Lab 05/29/21 1255 05/30/21 0512 06/03/21 0536  WBC 6.1 5.3 9.2  NEUTROABS 4.2  --   --    HGB 12.6* 11.2* 13.0  HCT 39.4 34.1* 38.8*  MCV 88.9 89.3 87.2  PLT 261 230 247   Cardiac Enzymes: No results for input(s): CKTOTAL, CKMB, CKMBINDEX, TROPONINI in the last 168 hours. BNP (last 3 results) No results for input(s): PROBNP in the last 8760 hours. CBG: No results for input(s): GLUCAP in the last 168 hours. D-Dimer: No results for input(s): DDIMER in the last 72 hours. Hgb A1c: No results for input(s): HGBA1C in the last 72 hours. Lipid Profile: No results for input(s): CHOL, HDL, LDLCALC, TRIG, CHOLHDL, LDLDIRECT in the last 72 hours. Thyroid function studies: No results for input(s): TSH, T4TOTAL, T3FREE, THYROIDAB in the last 72 hours.  Invalid input(s): FREET3 Anemia work up: No results for input(s): VITAMINB12, FOLATE, FERRITIN, TIBC, IRON, RETICCTPCT in the last 72 hours. Sepsis Labs: Recent Labs  Lab 05/29/21 1255 05/29/21 1500 05/30/21 0512 06/03/21 0536  WBC 6.1  --  5.3 9.2  LATICACIDVEN 2.2* 1.3  --   --     Microbiology Recent Results (from the past 240 hour(s))  Body fluid culture w Gram Stain     Status: None   Collection Time: 05/27/21  4:12 PM   Specimen: Synovium; Body Fluid  Result Value Ref Range Status   Specimen Description   Final    SYNOVIAL L KNEE Performed at [facility], [address] [place], [place]  [address]    Special Requests   Final    Normal Performed at [facility], [address] [place], [place] [address]    Gram Stain   Final    NO SQUAMOUS EPITHELIAL CELLS SEEN FEW WBC SEEN NO ORGANISMS SEEN    Culture   Final    NO GROWTH 3 DAYS Performed at [facility], [address], [place], [place] [address]    Report Status 05/31/2021 FINAL  Final  Culture, blood (routine x 2)     Status: None   Collection Time: 05/29/21  3:42 PM   Specimen: BLOOD  Result Value Ref Range Status   Specimen Description BLOOD LFOA  Final   Special Requests BOTTLES DRAWN AEROBIC AND ANAEROBIC BCAV   Final   Culture   Final    NO GROWTH 5 DAYS Performed at [facility], [address] [place], [place] [address]    Report Status 06/03/2021 FINAL  Final  Culture, blood (routine x 2)     Status: None   Collection Time: 05/29/21  3:47 PM   Specimen: BLOOD  Result Value Ref Range Status   Specimen Description BLOOD BRH  Final   Special Requests BOTTLES DRAWN AEROBIC AND ANAEROBIC BCLV  Final   Culture   Final    NO GROWTH 5 DAYS Performed at [facility], [address] [place], [place] [address]    Report Status 06/03/2021 FINAL  Final  Chlamydia/NGC rt PCR ([facility] only)     Status: None   Collection Time: 05/29/21  7:06 PM   Specimen: Urine  Result Value Ref Range Status   Specimen source GC/Chlam URINE, RANDOM  Final   Chlamydia Tr NOT DETECTED NOT DETECTED Final   N gonorrhoeae NOT DETECTED NOT DETECTED Final    Comment: (NOTE) This CT/NG assay has not been evaluated in patients with a history of  hysterectomy. Performed at [facility], [address] [place], [place] [address]   Aerobic/Anaerobic Culture w Gram Stain (surgical/deep wound)     Status: None (Preliminary result)   Collection Time: 05/31/21  2:54 PM   Specimen: Wound  Result Value Ref Range Status   Specimen Description   Final    WOUND WD Performed at [facility], [address] [place], [place] [address]    Special Requests   Final    NONE Performed at [facility] [address] [place], [place] [address]    Gram Stain   Final    FEW WBC PRESENT, PREDOMINANTLY PMN NO ORGANISMS SEEN Performed at [facility], [address], [place], [place] [address]    Culture   Final    RARE STAPHYLOCOCCUS AUREUS NO ANAEROBES ISOLATED; CULTURE IN PROGRESS FOR 5 DAYS    Report Status PENDING  Incomplete   Organism ID, Bacteria STAPHYLOCOCCUS AUREUS  Final      Susceptibility   Staphylococcus aureus - MIC*    CIPROFLOXACIN <=0.5 SENSITIVE Sensitive      ERYTHROMYCIN <=0.25 SENSITIVE Sensitive     GENTAMICIN <=0.5 SENSITIVE Sensitive     OXACILLIN 0.5 SENSITIVE Sensitive     TETRACYCLINE <=1 SENSITIVE Sensitive     VANCOMYCIN <=0.5 SENSITIVE Sensitive     TRIMETH/SULFA <=10 SENSITIVE Sensitive     CLINDAMYCIN <=0.25 SENSITIVE Sensitive     RIFAMPIN <=0.5 SENSITIVE Sensitive     Inducible Clindamycin NEGATIVE Sensitive     * RARE STAPHYLOCOCCUS AUREUS    Procedures and  diagnostic studies:  No results found.             LOS: 6 days   Carma Dwiggins  [facility]   Pager on [email or web address]. If 7PM-7AM, please contact night-coverage at [email or web address]     06/04/2021, 3:32 PM

## 2021-06-04 NOTE — Progress Notes (Signed)
[facility] SURGICAL PROGRESS NOTE  Hospital Day(s): 6.   Post op day(s): 2 Days Post-Op.   Interval History: Patient seen and examined, no acute events overnight. Patient reports suprapubic pain and no BM since pre-op.  Noted that CT on day of surgery had shown a stool burden.    Review of Systems:  Constitutional: denies fever, chills  Respiratory: denies any shortness of breath  Cardiovascular: denies chest pain or palpitations  Gastrointestinal: Denies N/V, or diarrhea/and bowel function as per interval history Musculoskeletal: denies pain, decreased motor or sensation Integumentary: denies any other rashes or skin discolorations  Vital signs in last 24 hours: [min-max] current  Temp:  [97.6 F (36.4 C)-97.9 F (36.6 C)] 97.7 F (36.5 C) (05/26 0809) Pulse Rate:  [78-98] 78 (05/26 0809) Resp:  [15-18] 18 (05/26 0809) BP: (122-166)/(78-112) 133/95 (05/26 0809) SpO2:  [95 %-99 %] 99 % (05/26 0809)     Height: 5\' 8"  (172.7 cm) Weight: 81.6 kg BMI (Calculated): 27.38   Intake/Output last 2 shifts:  05/25 0701 - 05/26 0700 In: 720 [P.O.:720] Out: -    Physical Exam:  Constitutional: alert, cooperative and no distress  Respiratory: breathing non-labored at rest  Cardiovascular: regular rate and sinus rhythm  Gastrointestinal: soft, non-tender, and non-distended Integumentary: Umbilical incision c/d/i.  Suprapubic and left lower quadrant trocar incisions are c/d/I.   Labs:     Latest Ref Rng & Units 06/03/2021    5:36 AM 05/30/2021    5:12 AM 05/29/2021   12:55 PM  CBC  WBC 4.0 - 10.5 K/uL 9.2   5.3   6.1    Hemoglobin 13.0 - 17.0 g/dL 05/31/2021   16.1   09.6    Hematocrit 39.0 - 52.0 % 38.8   34.1   39.4    Platelets 150 - 400 K/uL 247   230   261        Latest Ref Rng & Units 06/02/2021    3:43 AM 05/31/2021   10:38 AM 05/30/2021    5:12 AM  CMP  Glucose 70 - 99 mg/dL  85   98    BUN 6 - 20 mg/dL  16   14    Creatinine 0.61 - 1.24 mg/dL 06/01/2021   4.09    8.11    Sodium 135 - 145 mmol/L  140   138    Potassium 3.5 - 5.1 mmol/L  4.4   4.0    Chloride 98 - 111 mmol/L  109   109    CO2 22 - 32 mmol/L  24   24    Calcium 8.9 - 10.3 mg/dL  8.7   8.8       Imaging studies: No new pertinent imaging studies   Assessment/Plan:  61 y.o. male with  2 Days Post-Op s/p laparoscopic appendectomy for acute appendicitis, complicated by pertinent comorbidities including:.  Patient Active Problem List   Diagnosis Date Noted   Acute appendicitis 06/02/2021   Cellulitis of right hand 05/29/2021   Effusion of bursa of left knee 05/29/2021   Depression 05/29/2021   Major depressive disorder, recurrent episode, moderate (HCC) 05/15/2021   Cluster B personality disorder (HCC) 05/03/2021   Bipolar disorder (HCC) 01/18/2021   Essential hypertension 01/18/2021   Alcohol abuse 01/18/2021    -I advanced his diet to regular, and discharge/transfer per primary service when ready.  -I can follow-up this patient in 2 weeks.  Regarding the appendicitis no additional antibiotics needed.  -Will add laxatives  to assist with bowel activity.   -His incision was closed with Dermabond.  It is best to keep it clean and dry, it will tolerate a brief shower, but do not soak it or apply any creams or lotions to the incisions.  The Dermabond should gradually flake off over time.  Keep it open to air so you can evaluate your incisions.  Dermabond assists the underlying sutures to keep your incision closed and protected from infection.  Should you develop some drainage from your incision, some drops of drainage would be okay but if it persists continue to put keep a dry dressing over it.    Surgery to sign off.  Pathology report reviewed .  All of the above findings and recommendations were discussed with the patient, and all of patient's questions were answered to their expressed satisfaction.  -- Campbell Lerner, M.D., [facility] 06/04/2021

## 2021-06-04 NOTE — Plan of Care (Signed)

## 2021-06-05 DIAGNOSIS — L03113 Cellulitis of right upper limb: Secondary | ICD-10-CM | POA: Diagnosis not present

## 2021-06-05 DIAGNOSIS — K353 Acute appendicitis with localized peritonitis, without perforation or gangrene: Secondary | ICD-10-CM | POA: Diagnosis not present

## 2021-06-05 DIAGNOSIS — F3132 Bipolar disorder, current episode depressed, moderate: Secondary | ICD-10-CM | POA: Diagnosis not present

## 2021-06-05 LAB — AEROBIC/ANAEROBIC CULTURE W GRAM STAIN (SURGICAL/DEEP WOUND)

## 2021-06-05 MED ORDER — LACTULOSE 10 GM/15ML PO SOLN
30.0000 g | Freq: Two times a day (BID) | ORAL | Status: AC
  Administered 2021-06-05: 30 g via ORAL
  Filled 2021-06-05 (×2): qty 60

## 2021-06-05 NOTE — Plan of Care (Signed)
  Problem: Skin Integrity: Goal: Skin integrity will improve Outcome: Progressing   Problem: Clinical Measurements: Goal: Ability to avoid or minimize complications of infection will improve Outcome: Progressing   Problem: Education: Goal: Knowledge of General Education information will improve Description: Including pain rating scale, medication(s)/side effects and non-pharmacologic comfort measures Outcome: Progressing   Problem: Health Behavior/Discharge Planning: Goal: Ability to manage health-related needs will improve Outcome: Progressing   Problem: Clinical Measurements: Goal: Ability to maintain clinical measurements within normal limits will improve Outcome: Progressing Goal: Will remain free from infection Outcome: Progressing Goal: Diagnostic test results will improve Outcome: Progressing Goal: Respiratory complications will improve Outcome: Progressing Goal: Cardiovascular complication will be avoided Outcome: Progressing   Problem: Clinical Measurements: Goal: Will remain free from infection Outcome: Progressing   Problem: Activity: Goal: Risk for activity intolerance will decrease Outcome: Progressing   Problem: Nutrition: Goal: Adequate nutrition will be maintained Outcome: Progressing   Problem: Coping: Goal: Level of anxiety will decrease Outcome: Progressing   Problem: Elimination: Goal: Will not experience complications related to bowel motility Outcome: Progressing Goal: Will not experience complications related to urinary retention Outcome: Progressing   Problem: Pain Managment: Goal: General experience of comfort will improve Outcome: Progressing   Problem: Safety: Goal: Ability to remain free from injury will improve Outcome: Progressing   Problem: Skin Integrity: Goal: Risk for impaired skin integrity will decrease Outcome: Progressing

## 2021-06-05 NOTE — Progress Notes (Addendum)
Progress Note    [name]  [id number] DOB: 1960/10/30  DOA: 05/29/2021 PCP: Ranae Plumber, PA      Brief Narrative:    Medical records reviewed and are as summarized below:   61 y.o. male with PMHx of bipolar disorder, anxiety, GERD, hypertension, urolithiasis and PTSD, who presented to the ER on 05/29/2021 with worsening right hand swelling and pain that started swelling a couple weeks ago after having a piece of glass on his third MCP.  He also reported that he had started having swelling in his left knee 4-5 days prior.  Noted hx of where he had his left TKA in [id number] that was complicated then by infection with septic arthritis and required washout procedure.  He [place] seen in the ER 3 days prior to this presentation for his left knee pain and was discharged on Keflex and Bactrim after negative work-up.   Orthopedic surgery was consulted who recommended admission for IV antibiotics and close monitoring for both the left knee effusion and right hand cellulitis with associated third MCP tenosynovitis.  He complained of right lower quadrant abdominal pain while hospitalized..  CT abdomen pelvis revealed acute appendicitis. He was started on empiric IV antibiotics for this.      Assessment/Plan:   Principal Problem:   Bipolar disorder ([place]) Active Problems:   Cellulitis of right hand   Effusion of bursa of left knee   Essential hypertension   Depression   Acute appendicitis    Body mass index is 27.37 kg/m.  Right hand cellulitis, right third finger extensor tendon tenosynovitis, left knee effusion: S/p I&D of right hand abscess on 05/31/2021.  Wound culture from right hand showed MSSA.  Discontinue cefadroxil.  Of note, left knee fluid culture from 05/27/2021 did not show any growth.  Acute appendicitis: S/p laparoscopic appendectomy on 06/03/2021.  Continue analgesics as needed for pain  Constipation noted on CT scan: Continue laxatives as  needed  Bipolar disorder, depression with suicidal ideation: Continue psychotropics.  Continue one-to-one watch for safety.  I reached out to Dr. Louis Meckel, psychiatrist, (via secure chat) to see if they have any beds available today.  Unfortunately, patient cannot be discharged today because of bed unavailability.    Hypertension: Continue enalapril  1.3 cm right lower pole kidney mass: Outpatient follow-up with ultrasound in 6 months recommended for surveillance.    Diet Order             Diet regular Room service appropriate? Yes; Fluid consistency: Thin  Diet effective now                          Consultants: Orthopedic surgeon, general surgeon  Procedures: I&D of right hand cellulitis/abscess on 05/31/2021    Medications:    acetaminophen  1,000 mg Oral Q8H   Or   acetaminophen  650 mg Rectal Q8H   bisacodyl  10 mg Rectal Daily   doxepin  100 mg Oral QHS   enalapril  10 mg Oral Daily   enoxaparin (LOVENOX) injection  40 mg Subcutaneous Q24H   famotidine  20 mg Oral Daily   FLUoxetine  60 mg Oral Daily   gabapentin  400 mg Oral TID   ibuprofen  400 mg Oral Q8H   lactulose  30 g Oral BID   magnesium hydroxide  30 mL Oral QHS   naltrexone  50 mg Oral Daily   OLANZapine  10 mg Oral QHS  OLANZapine  5 mg Oral Daily   pantoprazole  40 mg Oral Daily   polyethylene glycol  17 g Oral Daily   senna-docusate  1 tablet Oral QHS   Continuous Infusions:  sodium chloride Stopped (06/03/21 0720)     Anti-infectives (From admission, onward)    Start     Dose/Rate Route Frequency Ordered Stop   06/03/21 2200  cefadroxil (DURICEF) capsule 500 mg  Status:  Discontinued        500 mg Oral 2 times daily 06/03/21 1040 06/05/21 1121   06/02/21 2200  cefTRIAXone (ROCEPHIN) 2 g in sodium chloride 0.9 % 100 mL IVPB  Status:  Discontinued        2 g 200 mL/hr over 30 Minutes Intravenous Every 24 hours 06/02/21 1639 06/03/21 1040   06/02/21 1800  metroNIDAZOLE (FLAGYL)  IVPB 500 mg  Status:  Discontinued        500 mg 100 mL/hr over 60 Minutes Intravenous Every 12 hours 06/02/21 1639 06/03/21 1040   05/30/21 1700  vancomycin (VANCOREADY) IVPB 1750 mg/350 mL  Status:  Discontinued        1,750 mg 175 mL/hr over 120 Minutes Intravenous Every 24 hours 05/30/21 0142 05/30/21 0733   05/30/21 1000  vancomycin (VANCOCIN) IVPB 1000 mg/200 mL premix  Status:  Discontinued        1,000 mg 200 mL/hr over 60 Minutes Intravenous Every 12 hours 05/30/21 0733 06/03/21 1040   05/30/21 0600  ceFEPIme (MAXIPIME) 2 g in sodium chloride 0.9 % 100 mL IVPB  Status:  Discontinued        2 g 200 mL/hr over 30 Minutes Intravenous Every 8 hours 05/30/21 0123 06/02/21 1555   05/30/21 0230  vancomycin (VANCOREADY) IVPB 1250 mg/250 mL        1,250 mg 166.7 mL/hr over 90 Minutes Intravenous  Once 05/30/21 0142 05/30/21 0336   05/30/21 0215  vancomycin (VANCOCIN) IVPB 1000 mg/200 mL premix  Status:  Discontinued        1,000 mg 200 mL/hr over 60 Minutes Intravenous  Once 05/30/21 0123 05/30/21 0143   05/29/21 1600  cefTRIAXone (ROCEPHIN) 1 g in sodium chloride 0.9 % 100 mL IVPB        1 g 200 mL/hr over 30 Minutes Intravenous  Once 05/29/21 1547 05/29/21 1657   05/29/21 1600  vancomycin (VANCOCIN) IVPB 1000 mg/200 mL premix        1,000 mg 200 mL/hr over 60 Minutes Intravenous  Once 05/29/21 1547 05/29/21 1806              Family Communication/Anticipated D/C date and plan/Code Status   DVT prophylaxis: enoxaparin (LOVENOX) injection 40 mg Start: 05/29/21 2200     Code Status: Full Code  Family Communication: None Disposition Plan: Plan to discharge to behavioral health unit  tomorrow   Status is: Inpatient Remains inpatient appropriate because: Awaiting placement to behavioral health unit       Subjective:   Interval events noted.  He complains of constipation.  Abdominal pain is better.  His nurse was at the bedside.  He also has also a sitter at the  bedside.  Objective:    Vitals:   06/04/21 1631 06/04/21 2026 06/05/21 0505 06/05/21 0901  BP: (!) 127/92 116/77 124/72 (!) 133/91  Pulse: 75 68 71 72  Resp: 18 17 18 16   Temp: 97.7 F (36.5 C) 97.6 F (36.4 C) 97.6 F (36.4 C) 98 F (36.7 C)  TempSrc:  SpO2: 95% 96% 93% 95%  Weight:      Height:       No data found.   Intake/Output Summary (Last 24 hours) at 06/05/2021 1126 Last data filed at 06/05/2021 1013 Gross per 24 hour  Intake 728.18 ml  Output 1225 ml  Net -496.82 ml   Filed Weights   05/29/21 1253  Weight: 81.6 kg    Exam:  GEN: NAD SKIN: Wound on dorsum of right hand is healing. EYES: No pallor or icterus ENT: MMM CV: RRR PULM: CTA B ABD: soft, ND, mild lower abdominal tenderness, small surgical incisions look clean, dry and intact, +BS CNS: AAO x 3, non focal EXT: Left knee swelling has improved PSYCH: Depressed          Data Reviewed:   I have personally reviewed following labs and imaging studies:  Labs: Labs show the following:   Basic Metabolic Panel: Recent Labs  Lab 05/29/21 1255 05/30/21 0512 05/31/21 1038 06/02/21 0343  NA 138 138 140  --   K 4.0 4.0 4.4  --   CL 104 109 109  --   CO2 25 24 24   --   GLUCOSE 137* 98 85  --   BUN 14 14 16   --   CREATININE 1.11 1.02 1.00 0.93  CALCIUM 9.9 8.8* 8.7*  --    GFR Estimated Creatinine Clearance: 83.8 mL/min (by C-G formula based on SCr of 0.93 mg/dL). Liver Function Tests: Recent Labs  Lab 05/29/21 1255  AST 24  ALT 27  ALKPHOS 69  BILITOT 0.6  PROT 7.3  ALBUMIN 3.8   No results for input(s): LIPASE, AMYLASE in the last 168 hours. No results for input(s): AMMONIA in the last 168 hours. Coagulation profile No results for input(s): INR, PROTIME in the last 168 hours.  CBC: Recent Labs  Lab 05/29/21 1255 05/30/21 0512 06/03/21 0536  WBC 6.1 5.3 9.2  NEUTROABS 4.2  --   --   HGB 12.6* 11.2* 13.0  HCT 39.4 34.1* 38.8*  MCV 88.9 89.3 87.2  PLT 261 230  247   Cardiac Enzymes: No results for input(s): CKTOTAL, CKMB, CKMBINDEX, TROPONINI in the last 168 hours. BNP (last 3 results) No results for input(s): PROBNP in the last 8760 hours. CBG: No results for input(s): GLUCAP in the last 168 hours. D-Dimer: No results for input(s): DDIMER in the last 72 hours. Hgb A1c: No results for input(s): HGBA1C in the last 72 hours. Lipid Profile: No results for input(s): CHOL, HDL, LDLCALC, TRIG, CHOLHDL, LDLDIRECT in the last 72 hours. Thyroid function studies: No results for input(s): TSH, T4TOTAL, T3FREE, THYROIDAB in the last 72 hours.  Invalid input(s): FREET3 Anemia work up: No results for input(s): VITAMINB12, FOLATE, FERRITIN, TIBC, IRON, RETICCTPCT in the last 72 hours. Sepsis Labs: Recent Labs  Lab 05/29/21 1255 05/29/21 1500 05/30/21 0512 06/03/21 0536  WBC 6.1  --  5.3 9.2  LATICACIDVEN 2.2* 1.3  --   --     Microbiology Recent Results (from the past 240 hour(s))  Body fluid culture w Gram Stain     Status: None   Collection Time: 05/27/21  4:12 PM   Specimen: Synovium; Body Fluid  Result Value Ref Range Status   Specimen Description   Final    SYNOVIAL L KNEE Performed at [facility], [address] [place], [place] [address]    Special Requests   Final    Normal Performed at [facility], [place],  [place], [place] [address]    Gram Stain   Final    NO SQUAMOUS EPITHELIAL CELLS SEEN FEW WBC SEEN NO ORGANISMS SEEN    Culture   Final    NO GROWTH 3 DAYS Performed at [facility], [place] [address] [place], [place] [address]    Report Status 05/31/2021 FINAL  Final  Culture, blood (routine x 2)     Status: None   Collection Time: 05/29/21  3:42 PM   Specimen: BLOOD  Result Value Ref Range Status   Specimen Description BLOOD LFOA  Final   Special Requests BOTTLES DRAWN AEROBIC AND ANAEROBIC BCAV  Final   Culture   Final    NO GROWTH 5 DAYS Performed at [facility],  [address] [place], [place] [address]    Report Status 06/03/2021 FINAL  Final  Culture, blood (routine x 2)     Status: None   Collection Time: 05/29/21  3:47 PM   Specimen: BLOOD  Result Value Ref Range Status   Specimen Description BLOOD [facility]  Final   Special Requests BOTTLES DRAWN AEROBIC AND ANAEROBIC BCLV  Final   Culture   Final    NO GROWTH 5 DAYS Performed at [facility], [address] [place], [place] [address]    Report Status 06/03/2021 FINAL  Final  Chlamydia/NGC rt PCR ([place] only)     Status: None   Collection Time: 05/29/21  7:06 PM   Specimen: Urine  Result Value Ref Range Status   Specimen source GC/Chlam URINE, RANDOM  Final   Chlamydia Tr NOT DETECTED NOT DETECTED Final   N gonorrhoeae NOT DETECTED NOT DETECTED Final    Comment: (NOTE) This CT/NG assay has not been evaluated in patients with a history of  hysterectomy. Performed at [facility], [address] [place], [place] [address]   Aerobic/Anaerobic Culture w Gram Stain (surgical/deep wound)     Status: None (Preliminary result)   Collection Time: 05/31/21  2:54 PM   Specimen: Wound  Result Value Ref Range Status   Specimen Description   Final    WOUND WD Performed at [facility], [address], [place], [place] [address]    Special Requests   Final    NONE Performed at [facility], [address] [place], [place] [address]    Gram Stain   Final    FEW WBC PRESENT, PREDOMINANTLY PMN NO ORGANISMS SEEN Performed at [facility], [place] [address], [place], [place] [address]    Culture   Final    RARE STAPHYLOCOCCUS AUREUS NO ANAEROBES ISOLATED; CULTURE IN PROGRESS FOR 5 DAYS    Report Status PENDING  Incomplete   Organism ID, Bacteria STAPHYLOCOCCUS AUREUS  Final      Susceptibility   Staphylococcus aureus - MIC*    CIPROFLOXACIN <=0.5 SENSITIVE Sensitive     ERYTHROMYCIN <=0.25 SENSITIVE Sensitive     GENTAMICIN <=0.5 SENSITIVE  Sensitive     OXACILLIN 0.5 SENSITIVE Sensitive     TETRACYCLINE <=1 SENSITIVE Sensitive     VANCOMYCIN <=0.5 SENSITIVE Sensitive     TRIMETH/SULFA <=10 SENSITIVE Sensitive     CLINDAMYCIN <=0.25 SENSITIVE Sensitive     RIFAMPIN <=0.5 SENSITIVE Sensitive     Inducible Clindamycin NEGATIVE Sensitive     * RARE STAPHYLOCOCCUS AUREUS    Procedures and diagnostic studies:  No results found.             LOS: 7 days  Aryianna Earwood  [employer], advertising on [email or web address]. If 7PM-7AM, please contact night-coverage at [email or web address]     06/05/2021, 11:26 AM

## 2021-06-06 DIAGNOSIS — K353 Acute appendicitis with localized peritonitis, without perforation or gangrene: Secondary | ICD-10-CM | POA: Diagnosis not present

## 2021-06-06 DIAGNOSIS — L03113 Cellulitis of right upper limb: Secondary | ICD-10-CM | POA: Diagnosis not present

## 2021-06-06 DIAGNOSIS — F3132 Bipolar disorder, current episode depressed, moderate: Secondary | ICD-10-CM | POA: Diagnosis not present

## 2021-06-06 MED ORDER — CEFADROXIL 500 MG PO CAPS
500.0000 mg | ORAL_CAPSULE | Freq: Two times a day (BID) | ORAL | Status: DC
  Administered 2021-06-06 – 2021-06-09 (×7): 500 mg via ORAL
  Filled 2021-06-06 (×7): qty 1

## 2021-06-06 NOTE — Progress Notes (Addendum)
Progress Note    [name]  [id number] DOB: 1960/03/28  DOA: 05/29/2021 PCP: Ranae Plumber, PA      Brief Narrative:    Medical records reviewed and are as summarized below:   61 y.o. male with PMHx of bipolar disorder, anxiety, GERD, hypertension, urolithiasis and PTSD, who presented to the ER on 05/29/2021 with worsening right hand swelling and pain that started swelling a couple weeks ago after having a piece of glass on his third MCP.  He also reported that he had started having swelling in his left knee 4-5 days prior.  Noted hx of where he had his left TKA in [id number] that was complicated then by infection with septic arthritis and required washout procedure.  He [place] seen in the ER 3 days prior to this presentation for his left knee pain and was discharged on Keflex and Bactrim after negative work-up.   Orthopedic surgery was consulted who recommended admission for IV antibiotics and close monitoring for both the left knee effusion and right hand cellulitis with associated third MCP tenosynovitis.  He complained of right lower quadrant abdominal pain while hospitalized..  CT abdomen pelvis revealed acute appendicitis. He was started on empiric IV antibiotics for this.      Assessment/Plan:   Principal Problem:   Bipolar disorder ([place]) Active Problems:   Cellulitis of right hand   Effusion of bursa of left knee   Essential hypertension   Depression   Acute appendicitis    Body mass index is 27.37 kg/m.  Right hand cellulitis, right third finger extensor tendon tenosynovitis, left knee effusion: S/p I&D of right hand abscess on 05/31/2021.  Wound culture from right hand showed MSSA.  Dr. Sabra Heck, orthopedic surgeon, recommended additional 5 days of oral antibiotics for right hand cellulitis.  Restart cefadroxil and continue through 06/10/2021.  Of note, left knee fluid culture from 05/27/2021 did not show any growth.  Acute appendicitis: S/p laparoscopic  appendectomy on 06/03/2021.  Continue analgesics as needed for pain.  Constipation noted on CT scan: Improved.  Continue laxatives as needed.  Bipolar disorder, depression with suicidal ideation: Continue psychotropics.  Continue one-to-one watch for safety.  I reached out to Dr. Louis Meckel, psychiatrist, (via secure chat) to see if they have any beds available today.  Unfortunately, patient cannot be discharged today because of bed unavailability.    Hypertension: Continue enalapril  1.3 cm right lower pole kidney mass: Outpatient follow-up with ultrasound in 6 months recommended for surveillance.  Patient reminded about the importance of follow-up    Diet Order             Diet regular Room service appropriate? Yes; Fluid consistency: Thin  Diet effective now                          Consultants: Orthopedic surgeon, general surgeon  Procedures: I&D of right hand cellulitis/abscess on 05/31/2021    Medications:    acetaminophen  1,000 mg Oral Q8H   Or   acetaminophen  650 mg Rectal Q8H   bisacodyl  10 mg Rectal Daily   cefadroxil  500 mg Oral BID   doxepin  100 mg Oral QHS   enalapril  10 mg Oral Daily   enoxaparin (LOVENOX) injection  40 mg Subcutaneous Q24H   famotidine  20 mg Oral Daily   FLUoxetine  60 mg Oral Daily   gabapentin  400 mg Oral TID   ibuprofen  400 mg Oral Q8H   magnesium hydroxide  30 mL Oral QHS   naltrexone  50 mg Oral Daily   OLANZapine  10 mg Oral QHS   OLANZapine  5 mg Oral Daily   pantoprazole  40 mg Oral Daily   polyethylene glycol  17 g Oral Daily   senna-docusate  1 tablet Oral QHS   Continuous Infusions:  sodium chloride Stopped (06/03/21 0720)     Anti-infectives (From admission, onward)    Start     Dose/Rate Route Frequency Ordered Stop   06/06/21 1315  cefadroxil (DURICEF) capsule 500 mg        500 mg Oral 2 times daily 06/06/21 1228 06/11/21 0959   06/03/21 2200  cefadroxil (DURICEF) capsule 500 mg  Status:   Discontinued        500 mg Oral 2 times daily 06/03/21 1040 06/05/21 1121   06/02/21 2200  cefTRIAXone (ROCEPHIN) 2 g in sodium chloride 0.9 % 100 mL IVPB  Status:  Discontinued        2 g 200 mL/hr over 30 Minutes Intravenous Every 24 hours 06/02/21 1639 06/03/21 1040   06/02/21 1800  metroNIDAZOLE (FLAGYL) IVPB 500 mg  Status:  Discontinued        500 mg 100 mL/hr over 60 Minutes Intravenous Every 12 hours 06/02/21 1639 06/03/21 1040   05/30/21 1700  vancomycin (VANCOREADY) IVPB 1750 mg/350 mL  Status:  Discontinued        1,750 mg 175 mL/hr over 120 Minutes Intravenous Every 24 hours 05/30/21 0142 05/30/21 0733   05/30/21 1000  vancomycin (VANCOCIN) IVPB 1000 mg/200 mL premix  Status:  Discontinued        1,000 mg 200 mL/hr over 60 Minutes Intravenous Every 12 hours 05/30/21 0733 06/03/21 1040   05/30/21 0600  ceFEPIme (MAXIPIME) 2 g in sodium chloride 0.9 % 100 mL IVPB  Status:  Discontinued        2 g 200 mL/hr over 30 Minutes Intravenous Every 8 hours 05/30/21 0123 06/02/21 1555   05/30/21 0230  vancomycin (VANCOREADY) IVPB 1250 mg/250 mL        1,250 mg 166.7 mL/hr over 90 Minutes Intravenous  Once 05/30/21 0142 05/30/21 0336   05/30/21 0215  vancomycin (VANCOCIN) IVPB 1000 mg/200 mL premix  Status:  Discontinued        1,000 mg 200 mL/hr over 60 Minutes Intravenous  Once 05/30/21 0123 05/30/21 0143   05/29/21 1600  cefTRIAXone (ROCEPHIN) 1 g in sodium chloride 0.9 % 100 mL IVPB        1 g 200 mL/hr over 30 Minutes Intravenous  Once 05/29/21 1547 05/29/21 1657   05/29/21 1600  vancomycin (VANCOCIN) IVPB 1000 mg/200 mL premix        1,000 mg 200 mL/hr over 60 Minutes Intravenous  Once 05/29/21 1547 05/29/21 1806              Family Communication/Anticipated D/C date and plan/Code Status   DVT prophylaxis: enoxaparin (LOVENOX) injection 40 mg Start: 05/29/21 2200     Code Status: Full Code  Family Communication: None Disposition Plan: Plan to discharge to  behavioral health unit  tomorrow   Status is: Inpatient Remains inpatient appropriate because: Awaiting placement to behavioral health unit       Subjective:   He complains of abdominal pain from recent surgery.  He said pain is worse today than it was yesterday.  He was able to move his bowels yesterday.  He has  a sitter at the bedside  Objective:    Vitals:   06/05/21 0901 06/05/21 1628 06/05/21 1944 06/06/21 0739  BP: (!) 133/91 (!) 141/85 133/86 (!) 138/95  Pulse: 72 78 73 74  Resp: 16 16 19 18   Temp: 98 F (36.7 C) 98 F (36.7 C) 97.6 F (36.4 C) 98.5 F (36.9 C)  TempSrc:      SpO2: 95% 99% 99% 97%  Weight:      Height:       No data found.   Intake/Output Summary (Last 24 hours) at 06/06/2021 1230 Last data filed at 06/06/2021 1204 Gross per 24 hour  Intake 480 ml  Output 2825 ml  Net -2345 ml   Filed Weights   05/29/21 1253  Weight: 81.6 kg    Exam:  GEN: NAD SKIN: Wound on dorsal aspect of right hand looks good. EYES: EOMI ENT: MMM CV: RRR PULM: CTA B ABD: soft, ND, NT, +BS, small surgical incisions on abdomen clean, dry and intact CNS: AAO x 3, non focal EXT: Left knee swelling has improved PSYCH: Calm and cooperative.  Normal demeanor           Data Reviewed:   I have personally reviewed following labs and imaging studies:  Labs: Labs show the following:   Basic Metabolic Panel: Recent Labs  Lab 05/31/21 1038 06/02/21 0343  NA 140  --   K 4.4  --   CL 109  --   CO2 24  --   GLUCOSE 85  --   BUN 16  --   CREATININE 1.00 0.93  CALCIUM 8.7*  --    GFR Estimated Creatinine Clearance: 83.8 mL/min (by C-G formula based on SCr of 0.93 mg/dL). Liver Function Tests: No results for input(s): AST, ALT, ALKPHOS, BILITOT, PROT, ALBUMIN in the last 168 hours.  No results for input(s): LIPASE, AMYLASE in the last 168 hours. No results for input(s): AMMONIA in the last 168 hours. Coagulation profile No results for input(s):  INR, PROTIME in the last 168 hours.  CBC: Recent Labs  Lab 06/03/21 0536  WBC 9.2  HGB 13.0  HCT 38.8*  MCV 87.2  PLT 247   Cardiac Enzymes: No results for input(s): CKTOTAL, CKMB, CKMBINDEX, TROPONINI in the last 168 hours. BNP (last 3 results) No results for input(s): PROBNP in the last 8760 hours. CBG: No results for input(s): GLUCAP in the last 168 hours. D-Dimer: No results for input(s): DDIMER in the last 72 hours. Hgb A1c: No results for input(s): HGBA1C in the last 72 hours. Lipid Profile: No results for input(s): CHOL, HDL, LDLCALC, TRIG, CHOLHDL, LDLDIRECT in the last 72 hours. Thyroid function studies: No results for input(s): TSH, T4TOTAL, T3FREE, THYROIDAB in the last 72 hours.  Invalid input(s): FREET3 Anemia work up: No results for input(s): VITAMINB12, FOLATE, FERRITIN, TIBC, IRON, RETICCTPCT in the last 72 hours. Sepsis Labs: Recent Labs  Lab 06/03/21 0536  WBC 9.2    Microbiology Recent Results (from the past 240 hour(s))  Body fluid culture w Gram Stain     Status: None   Collection Time: 05/27/21  4:12 PM   Specimen: Synovium; Body Fluid  Result Value Ref Range Status   Specimen Description   Final    SYNOVIAL L KNEE Performed at [facility], [address], [place], [place] [address]    Special Requests   Final    Normal Performed at [facility], [address] [place], [place] [address]  Gram Stain   Final    NO SQUAMOUS EPITHELIAL CELLS SEEN FEW WBC SEEN NO ORGANISMS SEEN    Culture   Final    NO GROWTH 3 DAYS Performed at [facility], [address] [place], [place] [address]    Report Status 05/31/2021 FINAL  Final  Culture, blood (routine x 2)     Status: None   Collection Time: 05/29/21  3:42 PM   Specimen: BLOOD  Result Value Ref Range Status   Specimen Description BLOOD LFOA  Final   Special Requests BOTTLES DRAWN AEROBIC AND ANAEROBIC BCAV  Final   Culture   Final    NO GROWTH 5  DAYS Performed at [facility], [address] [place], [place] [address]    Report Status 06/03/2021 FINAL  Final  Culture, blood (routine x 2)     Status: None   Collection Time: 05/29/21  3:47 PM   Specimen: BLOOD  Result Value Ref Range Status   Specimen Description BLOOD [facility]  Final   Special Requests BOTTLES DRAWN AEROBIC AND ANAEROBIC BCLV  Final   Culture   Final    NO GROWTH 5 DAYS Performed at [facility], [address] [place], [place] [address]    Report Status 06/03/2021 FINAL  Final  Chlamydia/NGC rt PCR ([facility] only)     Status: None   Collection Time: 05/29/21  7:06 PM   Specimen: Urine  Result Value Ref Range Status   Specimen source GC/Chlam URINE, RANDOM  Final   Chlamydia Tr NOT DETECTED NOT DETECTED Final   N gonorrhoeae NOT DETECTED NOT DETECTED Final    Comment: (NOTE) This CT/NG assay has not been evaluated in patients with a history of  hysterectomy. Performed at [facility], [address] [place], [place] [address]   Aerobic/Anaerobic Culture w Gram Stain (surgical/deep wound)     Status: None   Collection Time: 05/31/21  2:54 PM   Specimen: Wound  Result Value Ref Range Status   Specimen Description   Final    WOUND WD Performed at [facility], [address], [place], [place] [address]    Special Requests   Final    NONE Performed at [facility], [address] [place], [place] [address]    Gram Stain   Final    FEW WBC PRESENT, PREDOMINANTLY PMN NO ORGANISMS SEEN    Culture   Final    RARE STAPHYLOCOCCUS AUREUS NO ANAEROBES ISOLATED Performed at [facility], [address], [place], [place] [address]    Report Status 06/05/2021 FINAL  Final   Organism ID, Bacteria STAPHYLOCOCCUS AUREUS  Final      Susceptibility   Staphylococcus aureus - MIC*    CIPROFLOXACIN <=0.5 SENSITIVE Sensitive     ERYTHROMYCIN <=0.25 SENSITIVE Sensitive     GENTAMICIN <=0.5 SENSITIVE Sensitive      OXACILLIN 0.5 SENSITIVE Sensitive     TETRACYCLINE <=1 SENSITIVE Sensitive     VANCOMYCIN <=0.5 SENSITIVE Sensitive     TRIMETH/SULFA <=10 SENSITIVE Sensitive     CLINDAMYCIN <=0.25 SENSITIVE Sensitive     RIFAMPIN <=0.5 SENSITIVE Sensitive     Inducible Clindamycin NEGATIVE Sensitive     * RARE STAPHYLOCOCCUS AUREUS    Procedures and diagnostic studies:  No results found.             LOS: 8 days   Nikkole Placzek  [facility]   Pager on [email or web address]. If 7PM-7AM,  please contact night-coverage at [email or web address]     06/06/2021, 12:30 PM

## 2021-06-06 NOTE — Progress Notes (Signed)
Subjective: 4 Days Post-Op Procedure(s) (LRB): APPENDECTOMY LAPAROSCOPIC (N/A) HERNIA REPAIR UMBILICAL ADULT   Patient is 5 days post I&D of the right middle finger MP region.  The doing much better as far as the hand is concerned.  Also his left knee swelling is down a lot he says.  In the interim he has undergone an appendectomy and is recovering from that.  He is on IV antibiotics still.  He says his pain is much better overall. Patient reports pain as mild.  Objective:   VITALS:   Vitals:   06/05/21 1944 06/06/21 0739  BP: 133/86 (!) 138/95  Pulse: 73 74  Resp: 19 18  Temp: 97.6 F (36.4 C) 98.5 F (36.9 C)  SpO2: 99% 97%    Incision: no drainage The right hand shows a scab over the dorsum of the third MP joint where I incised it.  There is no drainage.  There is no longer any significant redness or swelling and range of motion of the fingers is good. Left knee shows a much less swelling.  Range of motion is better and has less pain.  There is no redness or sign of infection.  LABS No results for input(s): HGB, HCT, WBC, PLT in the last 72 hours.  No results for input(s): NA, K, BUN, CREATININE, GLUCOSE in the last 72 hours.  No results for input(s): LABPT, INR in the last 72 hours.   Assessment/Plan: 4 Days Post-Op Procedure(s) (LRB): APPENDECTOMY LAPAROSCOPIC (N/A) HERNIA REPAIR UMBILICAL ADULT   Plan: P.o. antibiotics for another 5 days for his right hand would be appropriate.  Coordinate this with the appendectomy needs. Psychiatry has seen the patient and did recommend transfer there. He may come to my office for recheck in 5 to 7 days if there is any concern about his hand infection still.

## 2021-06-06 NOTE — Plan of Care (Signed)
  Problem: Clinical Measurements: Goal: Ability to avoid or minimize complications of infection will improve Outcome: Progressing   Problem: Skin Integrity: Goal: Skin integrity will improve Outcome: Progressing   Problem: Health Behavior/Discharge Planning: Goal: Ability to manage health-related needs will improve Outcome: Progressing   Problem: Clinical Measurements: Goal: Will remain free from infection Outcome: Progressing   Problem: Clinical Measurements: Goal: Ability to maintain clinical measurements within normal limits will improve Outcome: Progressing   Problem: Clinical Measurements: Goal: Diagnostic test results will improve Outcome: Progressing   Problem: Nutrition: Goal: Adequate nutrition will be maintained Outcome: Progressing   Problem: Coping: Goal: Level of anxiety will decrease Outcome: Progressing   Problem: Elimination: Goal: Will not experience complications related to bowel motility Outcome: Progressing   Problem: Elimination: Goal: Will not experience complications related to urinary retention Outcome: Progressing   Problem: Safety: Goal: Ability to remain free from injury will improve Outcome: Progressing   Problem: Skin Integrity: Goal: Risk for impaired skin integrity will decrease Outcome: Progressing

## 2021-06-07 DIAGNOSIS — L03113 Cellulitis of right upper limb: Secondary | ICD-10-CM | POA: Diagnosis not present

## 2021-06-07 DIAGNOSIS — K353 Acute appendicitis with localized peritonitis, without perforation or gangrene: Secondary | ICD-10-CM | POA: Diagnosis not present

## 2021-06-07 DIAGNOSIS — F3132 Bipolar disorder, current episode depressed, moderate: Secondary | ICD-10-CM | POA: Diagnosis not present

## 2021-06-07 NOTE — Progress Notes (Signed)
Progress Note    [name]  PRF:[id number] DOB: Jun 06, 1960  DOA: 05/29/2021 PCP: [name], PA      Brief Narrative:    Medical records reviewed and are as summarized below:   61 y.o. male with PMHx of bipolar disorder, anxiety, GERD, hypertension, urolithiasis and PTSD, who presented to the ER on 05/29/2021 with worsening right hand swelling and pain that started swelling a couple weeks ago after having a piece of glass on his third MCP.  He also reported that he had started having swelling in his left knee 4-5 days prior.  Noted hx of where he had his left TKA in 2014 that was complicated then by infection with septic arthritis and required washout procedure.  He [place] seen in the ER 3 days prior to this presentation for his left knee pain and was discharged on Keflex and Bactrim after negative work-up.   Orthopedic surgery was consulted who recommended admission for IV antibiotics and close monitoring for both the left knee effusion and right hand cellulitis with associated third MCP tenosynovitis.  He complained of right lower quadrant abdominal pain while hospitalized..  CT abdomen pelvis revealed acute appendicitis. He was started on empiric IV antibiotics for this.      Assessment/Plan:   Principal Problem:   Bipolar disorder (HCC) Active Problems:   Cellulitis of right hand   Effusion of bursa of left knee   Essential hypertension   Depression   Acute appendicitis    Body mass index is 27.37 kg/m.  Right hand cellulitis, right third finger extensor tendon tenosynovitis, left knee effusion: S/p I&D of right hand abscess on 05/31/2021.  Wound culture from right hand showed MSSA. Continue cefadroxil through 06/10/2021.  Of note, left knee fluid culture from 05/27/2021 did not show any growth.  Acute appendicitis: S/p laparoscopic appendectomy on 06/03/2021.  Continue analgesics as needed for pain.  Constipation noted on CT scan: Improved.  Continue  laxatives as needed.  Bipolar disorder, depression with suicidal ideation: Continue psychotropics.  Continue one-to-one watch for safety.  Awaiting placement to behavioral health unit.  There are still no beds available at this time.  Hypertension: Continue enalapril  1.3 cm right lower pole kidney mass: Outpatient follow-up with ultrasound in 6 months recommended for surveillance.  Patient reminded about the importance of follow-up    Diet Order             Diet regular Room service appropriate? Yes; Fluid consistency: Thin  Diet effective now                          Consultants: Orthopedic surgeon, general surgeon  Procedures: I&D of right hand cellulitis/abscess on 05/31/2021    Medications:    acetaminophen  1,000 mg Oral Q8H   Or   acetaminophen  650 mg Rectal Q8H   bisacodyl  10 mg Rectal Daily   cefadroxil  500 mg Oral BID   doxepin  100 mg Oral QHS   enalapril  10 mg Oral Daily   enoxaparin (LOVENOX) injection  40 mg Subcutaneous Q24H   famotidine  20 mg Oral Daily   FLUoxetine  60 mg Oral Daily   gabapentin  400 mg Oral TID   ibuprofen  400 mg Oral Q8H   magnesium hydroxide  30 mL Oral QHS   naltrexone  50 mg Oral Daily   OLANZapine  10 mg Oral QHS   OLANZapine  5 mg  Oral Daily   pantoprazole  40 mg Oral Daily   polyethylene glycol  17 g Oral Daily   senna-docusate  1 tablet Oral QHS   Continuous Infusions:  sodium chloride Stopped (06/03/21 0720)     Anti-infectives (From admission, onward)    Start     Dose/Rate Route Frequency Ordered Stop   06/06/21 1400  cefadroxil (DURICEF) capsule 500 mg        500 mg Oral 2 times daily 06/06/21 1228 06/11/21 0959   06/03/21 2200  cefadroxil (DURICEF) capsule 500 mg  Status:  Discontinued        500 mg Oral 2 times daily 06/03/21 1040 06/05/21 1121   06/02/21 2200  cefTRIAXone (ROCEPHIN) 2 g in sodium chloride 0.9 % 100 mL IVPB  Status:  Discontinued        2 g 200 mL/hr over 30 Minutes  Intravenous Every 24 hours 06/02/21 1639 06/03/21 1040   06/02/21 1800  metroNIDAZOLE (FLAGYL) IVPB 500 mg  Status:  Discontinued        500 mg 100 mL/hr over 60 Minutes Intravenous Every 12 hours 06/02/21 1639 06/03/21 1040   05/30/21 1700  vancomycin (VANCOREADY) IVPB 1750 mg/350 mL  Status:  Discontinued        1,750 mg 175 mL/hr over 120 Minutes Intravenous Every 24 hours 05/30/21 0142 05/30/21 0733   05/30/21 1000  vancomycin (VANCOCIN) IVPB 1000 mg/200 mL premix  Status:  Discontinued        1,000 mg 200 mL/hr over 60 Minutes Intravenous Every 12 hours 05/30/21 0733 06/03/21 1040   05/30/21 0600  ceFEPIme (MAXIPIME) 2 g in sodium chloride 0.9 % 100 mL IVPB  Status:  Discontinued        2 g 200 mL/hr over 30 Minutes Intravenous Every 8 hours 05/30/21 0123 06/02/21 1555   05/30/21 0230  vancomycin (VANCOREADY) IVPB 1250 mg/250 mL        1,250 mg 166.7 mL/hr over 90 Minutes Intravenous  Once 05/30/21 0142 05/30/21 0336   05/30/21 0215  vancomycin (VANCOCIN) IVPB 1000 mg/200 mL premix  Status:  Discontinued        1,000 mg 200 mL/hr over 60 Minutes Intravenous  Once 05/30/21 0123 05/30/21 0143   05/29/21 1600  cefTRIAXone (ROCEPHIN) 1 g in sodium chloride 0.9 % 100 mL IVPB        1 g 200 mL/hr over 30 Minutes Intravenous  Once 05/29/21 1547 05/29/21 1657   05/29/21 1600  vancomycin (VANCOCIN) IVPB 1000 mg/200 mL premix        1,000 mg 200 mL/hr over 60 Minutes Intravenous  Once 05/29/21 1547 05/29/21 1806              Family Communication/Anticipated D/C date and plan/Code Status   DVT prophylaxis: enoxaparin (LOVENOX) injection 40 mg Start: 05/29/21 2200     Code Status: Full Code  Family Communication: None Disposition Plan: Plan to discharge to behavioral health unit  tomorrow   Status is: Inpatient Remains inpatient appropriate because: Awaiting placement to behavioral health unit       Subjective:   He complains of abdominal pain from recent surgery.   He said pain is worse today than it was yesterday.  He was able to move his bowels yesterday.  He has a sitter at the bedside  Objective:    Vitals:   06/06/21 1955 06/07/21 0408 06/07/21 0737 06/07/21 0827  BP: 140/90 (!) 157/99 (!) 147/96 (!) 139/97  Pulse: 79 78 74  75  Resp: 18 18 17 16   Temp: 98 F (36.7 C) 97.6 F (36.4 C) 97.8 F (36.6 C) 97.7 F (36.5 C)  TempSrc:  Oral    SpO2: 98% 100% 99% 96%  Weight:      Height:       No data found.   Intake/Output Summary (Last 24 hours) at 06/07/2021 1420 Last data filed at 06/07/2021 1404 Gross per 24 hour  Intake 600 ml  Output 5900 ml  Net -5300 ml   Filed Weights   05/29/21 1253  Weight: 81.6 kg    Exam:  GEN: NAD SKIN: Wound on dorsal aspect of right hand looks good EYES: EOMI ENT: MMM CV: RRR PULM: CTA B ABD: soft, ND, mild surgical tenderness in the lower abdomen, small surgical incisions look clean, dry and intact CNS: AAO x 3, non focal EXT: No edema or tenderness PSYCH: Calm and cooperative.  Mood looks normal.         Data Reviewed:   I have personally reviewed following labs and imaging studies:  Labs: Labs show the following:   Basic Metabolic Panel: Recent Labs  Lab 06/02/21 0343  CREATININE 0.93   GFR Estimated Creatinine Clearance: 83.8 mL/min (by C-G formula based on SCr of 0.93 mg/dL). Liver Function Tests: No results for input(s): AST, ALT, ALKPHOS, BILITOT, PROT, ALBUMIN in the last 168 hours.  No results for input(s): LIPASE, AMYLASE in the last 168 hours. No results for input(s): AMMONIA in the last 168 hours. Coagulation profile No results for input(s): INR, PROTIME in the last 168 hours.  CBC: Recent Labs  Lab 06/03/21 0536  WBC 9.2  HGB 13.0  HCT 38.8*  MCV 87.2  PLT 247   Cardiac Enzymes: No results for input(s): CKTOTAL, CKMB, CKMBINDEX, TROPONINI in the last 168 hours. BNP (last 3 results) No results for input(s): PROBNP in the last 8760 hours. CBG: No  results for input(s): GLUCAP in the last 168 hours. D-Dimer: No results for input(s): DDIMER in the last 72 hours. Hgb A1c: No results for input(s): HGBA1C in the last 72 hours. Lipid Profile: No results for input(s): CHOL, HDL, LDLCALC, TRIG, CHOLHDL, LDLDIRECT in the last 72 hours. Thyroid function studies: No results for input(s): TSH, T4TOTAL, T3FREE, THYROIDAB in the last 72 hours.  Invalid input(s): FREET3 Anemia work up: No results for input(s): VITAMINB12, FOLATE, FERRITIN, TIBC, IRON, RETICCTPCT in the last 72 hours. Sepsis Labs: Recent Labs  Lab 06/03/21 0536  WBC 9.2    Microbiology Recent Results (from the past 240 hour(s))  Culture, blood (routine x 2)     Status: None   Collection Time: 05/29/21  3:42 PM   Specimen: BLOOD  Result Value Ref Range Status   Specimen Description BLOOD LFOA  Final   Special Requests BOTTLES DRAWN AEROBIC AND ANAEROBIC BCAV  Final   Culture   Final    NO GROWTH 5 DAYS Performed at [facility], [address], [place], [place]    Report Status 06/03/2021 FINAL  Final  Culture, blood (routine x 2)     Status: None   Collection Time: 05/29/21  3:47 PM   Specimen: BLOOD  Result Value Ref Range Status   Specimen Description BLOOD BR[facility]  Final   Special Requests BOTTLES DRAWN AEROBIC AND ANAEROBIC BCLV  Final   Culture   Final    NO GROWTH 5 DAYS Performed at [facility], [address] [place], [place]  Report Status 06/03/2021 FINAL  Final  Chlamydia/NGC rt PCR ([facility] only)     Status: None   Collection Time: 05/29/21  7:06 PM   Specimen: Urine  Result Value Ref Range Status   Specimen source GC/Chlam URINE, RANDOM  Final   Chlamydia Tr NOT DETECTED NOT DETECTED Final   N gonorrhoeae NOT DETECTED NOT DETECTED Final    Comment: (NOTE) This CT/NG assay has not been evaluated in patients with a history of  hysterectomy. Performed at [facility], [address]  [place], [place] [address]   Aerobic/Anaerobic Culture w Gram Stain (surgical/deep wound)     Status: None   Collection Time: 05/31/21  2:54 PM   Specimen: Wound  Result Value Ref Range Status   Specimen Description   Final    WOUND WD Performed at [facility], [address] [place], [place] [address]    Special Requests   Final    NONE Performed at [facility], [address] [place], [place] [address]    Gram Stain   Final    FEW WBC PRESENT, PREDOMINANTLY PMN NO ORGANISMS SEEN    Culture   Final    RARE STAPHYLOCOCCUS AUREUS NO ANAEROBES ISOLATED Performed at [facility], [address] [place], [place] [address]    Report Status 06/05/2021 FINAL  Final   Organism ID, Bacteria STAPHYLOCOCCUS AUREUS  Final      Susceptibility   Staphylococcus aureus - MIC*    CIPROFLOXACIN <=0.5 SENSITIVE Sensitive     ERYTHROMYCIN <=0.25 SENSITIVE Sensitive     GENTAMICIN <=0.5 SENSITIVE Sensitive     OXACILLIN 0.5 SENSITIVE Sensitive     TETRACYCLINE <=1 SENSITIVE Sensitive     VANCOMYCIN <=0.5 SENSITIVE Sensitive     TRIMETH/SULFA <=10 SENSITIVE Sensitive     CLINDAMYCIN <=0.25 SENSITIVE Sensitive     RIFAMPIN <=0.5 SENSITIVE Sensitive     Inducible Clindamycin NEGATIVE Sensitive     * RARE STAPHYLOCOCCUS AUREUS    Procedures and diagnostic studies:  No results found.             LOS: 9 days   Daphane Odekirk  [facility]   Pager on [email or web address]. If 7PM-7AM, please contact night-coverage at [email or web address]     06/07/2021, 2:20 PM

## 2021-06-07 NOTE — Plan of Care (Signed)

## 2021-06-08 DIAGNOSIS — K353 Acute appendicitis with localized peritonitis, without perforation or gangrene: Secondary | ICD-10-CM | POA: Diagnosis not present

## 2021-06-08 DIAGNOSIS — L03113 Cellulitis of right upper limb: Secondary | ICD-10-CM | POA: Diagnosis not present

## 2021-06-08 DIAGNOSIS — F3132 Bipolar disorder, current episode depressed, moderate: Secondary | ICD-10-CM | POA: Diagnosis not present

## 2021-06-08 NOTE — Progress Notes (Signed)
Progress Note    [name]  ZOX:[id number] DOB: Mar 25, 1960  DOA: 05/29/2021 PCP: [name], PA      Brief Narrative:    Medical records reviewed and are as summarized below:   61 y.o. male with PMHx of bipolar disorder, anxiety, GERD, hypertension, urolithiasis and PTSD, who presented to the ER on 05/29/2021 with worsening right hand swelling and pain that started swelling a couple weeks ago after having a piece of glass on his third MCP.  He also reported that he had started having swelling in his left knee 4-5 days prior.  Noted hx of where he had his left TKA in 2014 that was complicated then by infection with septic arthritis and required washout procedure.  He [place] seen in the ER 3 days prior to this presentation for his left knee pain and was discharged on Keflex and Bactrim after negative work-up.   Orthopedic surgery was consulted who recommended admission for IV antibiotics and close monitoring for both the left knee effusion and right hand cellulitis with associated third MCP tenosynovitis.  He complained of right lower quadrant abdominal pain while hospitalized..  CT abdomen pelvis revealed acute appendicitis. He was started on empiric IV antibiotics for this.      Assessment/Plan:   Principal Problem:   Bipolar disorder (HCC) Active Problems:   Cellulitis of right hand   Effusion of bursa of left knee   Essential hypertension   Depression   Acute appendicitis    Body mass index is 27.37 kg/m.  Right hand cellulitis, right third finger extensor tendon tenosynovitis, left knee effusion: S/p I&D of right hand abscess on 05/31/2021.  Wound culture from right hand showed MSSA.  Continue cefadroxil through 06/10/2021.  Of note, left knee fluid culture from 05/27/2021 did not show any growth.  Acute appendicitis: S/p laparoscopic appendectomy on 06/03/2021.  Continue analgesics as needed for pain.  Constipation noted on CT scan: Improved.  Continue  laxatives as needed.  Bipolar disorder, depression with suicidal ideation: Continue psychotropics.  Continue one-to-one watch for safety.  Awaiting placement to behavioral health unit.  Hypertension: Continue enalapril  1.3 cm right lower pole kidney mass: Outpatient follow-up with ultrasound in 6 months recommended for surveillance.      Diet Order             Diet regular Room service appropriate? Yes; Fluid consistency: Thin  Diet effective now                          Consultants: Orthopedic surgeon, general surgeon, psychiatrist  Procedures: I&D of right hand cellulitis/abscess on 05/31/2021    Medications:    acetaminophen  1,000 mg Oral Q8H   Or   acetaminophen  650 mg Rectal Q8H   bisacodyl  10 mg Rectal Daily   cefadroxil  500 mg Oral BID   doxepin  100 mg Oral QHS   enalapril  10 mg Oral Daily   enoxaparin (LOVENOX) injection  40 mg Subcutaneous Q24H   famotidine  20 mg Oral Daily   FLUoxetine  60 mg Oral Daily   gabapentin  400 mg Oral TID   ibuprofen  400 mg Oral Q8H   magnesium hydroxide  30 mL Oral QHS   naltrexone  50 mg Oral Daily   OLANZapine  10 mg Oral QHS   OLANZapine  5 mg Oral Daily   pantoprazole  40 mg Oral Daily   polyethylene glycol  17 g Oral Daily   senna-docusate  1 tablet Oral QHS   Continuous Infusions:  sodium chloride Stopped (06/03/21 0720)     Anti-infectives (From admission, onward)    Start     Dose/Rate Route Frequency Ordered Stop   06/06/21 1400  cefadroxil (DURICEF) capsule 500 mg        500 mg Oral 2 times daily 06/06/21 1228 06/11/21 0959   06/03/21 2200  cefadroxil (DURICEF) capsule 500 mg  Status:  Discontinued        500 mg Oral 2 times daily 06/03/21 1040 06/05/21 1121   06/02/21 2200  cefTRIAXone (ROCEPHIN) 2 g in sodium chloride 0.9 % 100 mL IVPB  Status:  Discontinued        2 g 200 mL/hr over 30 Minutes Intravenous Every 24 hours 06/02/21 1639 06/03/21 1040   06/02/21 1800  metroNIDAZOLE  (FLAGYL) IVPB 500 mg  Status:  Discontinued        500 mg 100 mL/hr over 60 Minutes Intravenous Every 12 hours 06/02/21 1639 06/03/21 1040   05/30/21 1700  vancomycin (VANCOREADY) IVPB 1750 mg/350 mL  Status:  Discontinued        1,750 mg 175 mL/hr over 120 Minutes Intravenous Every 24 hours 05/30/21 0142 05/30/21 0733   05/30/21 1000  vancomycin (VANCOCIN) IVPB 1000 mg/200 mL premix  Status:  Discontinued        1,000 mg 200 mL/hr over 60 Minutes Intravenous Every 12 hours 05/30/21 0733 06/03/21 1040   05/30/21 0600  ceFEPIme (MAXIPIME) 2 g in sodium chloride 0.9 % 100 mL IVPB  Status:  Discontinued        2 g 200 mL/hr over 30 Minutes Intravenous Every 8 hours 05/30/21 0123 06/02/21 1555   05/30/21 0230  vancomycin (VANCOREADY) IVPB 1250 mg/250 mL        1,250 mg 166.7 mL/hr over 90 Minutes Intravenous  Once 05/30/21 0142 05/30/21 0336   05/30/21 0215  vancomycin (VANCOCIN) IVPB 1000 mg/200 mL premix  Status:  Discontinued        1,000 mg 200 mL/hr over 60 Minutes Intravenous  Once 05/30/21 0123 05/30/21 0143   05/29/21 1600  cefTRIAXone (ROCEPHIN) 1 g in sodium chloride 0.9 % 100 mL IVPB        1 g 200 mL/hr over 30 Minutes Intravenous  Once 05/29/21 1547 05/29/21 1657   05/29/21 1600  vancomycin (VANCOCIN) IVPB 1000 mg/200 mL premix        1,000 mg 200 mL/hr over 60 Minutes Intravenous  Once 05/29/21 1547 05/29/21 1806              Family Communication/Anticipated D/C date and plan/Code Status   DVT prophylaxis: enoxaparin (LOVENOX) injection 40 mg Start: 05/29/21 2200     Code Status: Full Code  Family Communication: None Disposition Plan: Plan to discharge to behavioral health unit  tomorrow   Status is: Inpatient Remains inpatient appropriate because: Awaiting placement to behavioral health unit       Subjective:   Interval events noted.  He is irritable and becoming inpatient because of delay in transfer to behavioral health unit.  He has a Comptroller at  the bedside.  Objective:    Vitals:   06/07/21 2016 06/08/21 0321 06/08/21 0817 06/08/21 1501  BP: (!) 128/95 (!) 130/93 (!) 145/88 (!) 145/86  Pulse: 83 83 74 90  Resp: 18 18  18   Temp: 97.8 F (36.6 C) 97.6 F (36.4 C) 98.2 F (36.8 C) 97.9 F (  36.6 C)  TempSrc:      SpO2: 100% 98% 98% 97%  Weight:      Height:       No data found.   Intake/Output Summary (Last 24 hours) at 06/08/2021 1510 Last data filed at 06/08/2021 1352 Gross per 24 hour  Intake 720 ml  Output 2675 ml  Net -1955 ml   Filed Weights   05/29/21 1253  Weight: 81.6 kg    Exam:   GEN: NAD SKIN: No rash EYES: EOMI ENT: MMM CV: RRR PULM: CTA B ABD: soft, ND, NT, +BS, minimal surgical tenderness in the lower abdomen.  Small surgical incisions are intact CNS: AAO x 3, non focal EXT: No edema or tenderness PSYCH: Anxious, irritable          Data Reviewed:   I have personally reviewed following labs and imaging studies:  Labs: Labs show the following:   Basic Metabolic Panel: Recent Labs  Lab 06/02/21 0343  CREATININE 0.93   GFR Estimated Creatinine Clearance: 83.8 mL/min (by C-G formula based on SCr of 0.93 mg/dL). Liver Function Tests: No results for input(s): AST, ALT, ALKPHOS, BILITOT, PROT, ALBUMIN in the last 168 hours.  No results for input(s): LIPASE, AMYLASE in the last 168 hours. No results for input(s): AMMONIA in the last 168 hours. Coagulation profile No results for input(s): INR, PROTIME in the last 168 hours.  CBC: Recent Labs  Lab 06/03/21 0536  WBC 9.2  HGB 13.0  HCT 38.8*  MCV 87.2  PLT 247   Cardiac Enzymes: No results for input(s): CKTOTAL, CKMB, CKMBINDEX, TROPONINI in the last 168 hours. BNP (last 3 results) No results for input(s): PROBNP in the last 8760 hours. CBG: No results for input(s): GLUCAP in the last 168 hours. D-Dimer: No results for input(s): DDIMER in the last 72 hours. Hgb A1c: No results for input(s): HGBA1C in the last 72  hours. Lipid Profile: No results for input(s): CHOL, HDL, LDLCALC, TRIG, CHOLHDL, LDLDIRECT in the last 72 hours. Thyroid function studies: No results for input(s): TSH, T4TOTAL, T3FREE, THYROIDAB in the last 72 hours.  Invalid input(s): FREET3 Anemia work up: No results for input(s): VITAMINB12, FOLATE, FERRITIN, TIBC, IRON, RETICCTPCT in the last 72 hours. Sepsis Labs: Recent Labs  Lab 06/03/21 0536  WBC 9.2    Microbiology Recent Results (from the past 240 hour(s))  Culture, blood (routine x 2)     Status: None   Collection Time: 05/29/21  3:42 PM   Specimen: BLOOD  Result Value Ref Range Status   Specimen Description BLOOD LFOA  Final   Special Requests BOTTLES DRAWN AEROBIC AND ANAEROBIC BCAV  Final   Culture   Final    NO GROWTH 5 DAYS Performed at [facility], [address] [place], [place] [address]    Report Status 06/03/2021 FINAL  Final  Culture, blood (routine x 2)     Status: None   Collection Time: 05/29/21  3:47 PM   Specimen: BLOOD  Result Value Ref Range Status   Specimen Description BLOOD [facility]  Final   Special Requests BOTTLES DRAWN AEROBIC AND ANAEROBIC BCLV  Final   Culture   Final    NO GROWTH 5 DAYS Performed at [facility], [address] [place], [place] [address]    Report Status 06/03/2021 FINAL  Final  Chlamydia/NGC rt PCR ([facility] only)     Status: None   Collection Time: 05/29/21  7:06 PM   Specimen: Urine  Result Value  Ref Range Status   Specimen source GC/Chlam URINE, RANDOM  Final   Chlamydia Tr NOT DETECTED NOT DETECTED Final   N gonorrhoeae NOT DETECTED NOT DETECTED Final    Comment: (NOTE) This CT/NG assay has not been evaluated in patients with a history of  hysterectomy. Performed at [facility], [address] [place], [place] [address]   Aerobic/Anaerobic Culture w Gram Stain (surgical/deep wound)     Status: None   Collection Time: 05/31/21  2:54 PM   Specimen: Wound  Result Value Ref  Range Status   Specimen Description   Final    WOUND WD Performed at [facility], [address], [place], [place] [address]    Special Requests   Final    NONE Performed at [facility], [address] [place], [place] [address]    Gram Stain   Final    FEW WBC PRESENT, PREDOMINANTLY PMN NO ORGANISMS SEEN    Culture   Final    RARE STAPHYLOCOCCUS AUREUS NO ANAEROBES ISOLATED Performed at [facility], [address] [place], [place] [address]    Report Status 06/05/2021 FINAL  Final   Organism ID, Bacteria STAPHYLOCOCCUS AUREUS  Final      Susceptibility   Staphylococcus aureus - MIC*    CIPROFLOXACIN <=0.5 SENSITIVE Sensitive     ERYTHROMYCIN <=0.25 SENSITIVE Sensitive     GENTAMICIN <=0.5 SENSITIVE Sensitive     OXACILLIN 0.5 SENSITIVE Sensitive     TETRACYCLINE <=1 SENSITIVE Sensitive     VANCOMYCIN <=0.5 SENSITIVE Sensitive     TRIMETH/SULFA <=10 SENSITIVE Sensitive     CLINDAMYCIN <=0.25 SENSITIVE Sensitive     RIFAMPIN <=0.5 SENSITIVE Sensitive     Inducible Clindamycin NEGATIVE Sensitive     * RARE STAPHYLOCOCCUS AUREUS    Procedures and diagnostic studies:  No results found.             LOS: 10 days   Cayman Kielbasa  [facility]   Pager on [email or web address]. If 7PM-7AM, please contact night-coverage at [email or web address]     06/08/2021, 3:10 PM

## 2021-06-08 NOTE — TOC Progression Note (Signed)
Transition of Care [facility]) - Progression Note    Patient Details  Name: [name] MRN: [id number] Date of Birth: August 04, 1960  [facility]) CM/SW Contact  Maree Krabbe, LCSW Phone Number: 06/08/2021, 2:26 PM  Clinical Narrative: still awaiting bed at [facility]          Expected Discharge Plan and Services                                                 Social Determinants of Health (SDOH) Interventions    Readmission Risk Interventions     View : No data to display.

## 2021-06-08 NOTE — Anesthesia Postprocedure Evaluation (Signed)
Anesthesia Post Note  Patient: [name]  Procedure(s) Performed: APPENDECTOMY LAPAROSCOPIC (Abdomen) HERNIA REPAIR UMBILICAL ADULT  Patient location during evaluation: PACU Anesthesia Type: General Level of consciousness: awake and alert Pain management: pain level controlled Vital Signs Assessment: post-procedure vital signs reviewed and stable Respiratory status: spontaneous breathing, nonlabored ventilation and respiratory function stable Cardiovascular status: blood pressure returned to baseline and stable Postop Assessment: no apparent nausea or vomiting Anesthetic complications: no   No notable events documented.   Last Vitals:  Vitals:   06/08/21 0817 06/08/21 1501  BP: (!) 145/88 (!) 145/86  Pulse: 74 90  Resp:  18  Temp: 36.8 C 36.6 C  SpO2: 98% 97%    Last Pain:  Vitals:   06/08/21 1501  TempSrc: Oral  PainSc: [place]

## 2021-06-08 NOTE — Plan of Care (Signed)

## 2021-06-09 DIAGNOSIS — L03113 Cellulitis of right upper limb: Secondary | ICD-10-CM | POA: Diagnosis not present

## 2021-06-09 DIAGNOSIS — K353 Acute appendicitis with localized peritonitis, without perforation or gangrene: Secondary | ICD-10-CM | POA: Diagnosis not present

## 2021-06-09 DIAGNOSIS — F331 Major depressive disorder, recurrent, moderate: Secondary | ICD-10-CM

## 2021-06-09 DIAGNOSIS — F3132 Bipolar disorder, current episode depressed, moderate: Secondary | ICD-10-CM | POA: Diagnosis not present

## 2021-06-09 DIAGNOSIS — F6089 Other specific personality disorders: Secondary | ICD-10-CM | POA: Diagnosis not present

## 2021-06-09 MED ORDER — OLANZAPINE 10 MG PO TABS: 10.0000 mg | ORAL_TABLET | Freq: Every day | ORAL | 0 refills | Status: DC

## 2021-06-09 MED ORDER — FLUOXETINE HCL 20 MG PO CAPS: 60.0000 mg | ORAL_CAPSULE | Freq: Every day | ORAL | 0 refills | Status: DC

## 2021-06-09 MED ORDER — CEPHALEXIN 500 MG PO CAPS
500.0000 mg | ORAL_CAPSULE | Freq: Four times a day (QID) | ORAL | 0 refills | Status: AC
Start: 2021-06-09 — End: 2021-06-11

## 2021-06-09 NOTE — Consult Note (Signed)
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 male patient admitted with medical issues.  HPI:  61 yo male who reports, "I made a comment I did not mean.  I feel much better."  He rates his depression at mild to moderate with no suicidal ideations or homicidal ideations, substance abuse, or psychosis.  He wants to discharge and return to work, denies any psych concerns and will return to [facility] for his care.  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 echo 5/17: EF 60-65%, no RWMA, LV dias fxn nl, LA mildly dilated, RV sys fxn nl, PASP nl, moderate sized circumferential pericardial effusion was identified, 2.12 cm around the LV free wall, <1 cm around the RV free wall. Features were not c/w tamponade physiology   PTSD (post-traumatic stress disorder)    Witnessed brother's suicide.   Restless leg syndrome    Syncope     Past Surgical History:  Procedure Laterality Date    CYSTOSCOPY WITH URETEROSCOPY AND STENT PLACEMENT     ESOPHAGOGASTRODUODENOSCOPY N/A 01/11/2016   Procedure: ESOPHAGOGASTRODUODENOSCOPY (EGD);  Surgeon: Wilford Corner, MD;  Location: [facility] ENDOSCOPY;  Service: Endoscopy;  Laterality: N/A;   ESOPHAGOGASTRODUODENOSCOPY N/A 04/09/2020   Procedure: ESOPHAGOGASTRODUODENOSCOPY (EGD);  Surgeon: Jonathon Bellows, MD;  Location: [facility] ENDOSCOPY;  Service: Gastroenterology;  Laterality: N/A;   INCISION AND DRAINAGE ABSCESS Left 01/02/2018   Procedure: INCISION AND DRAINAGE LEFT KNEE;  Surgeon: Earnestine Leys, MD;  Location: [facility] ORS;  Service: Orthopedics;  Laterality: Left;   JOINT REPLACEMENT Right    TKR   KNEE ARTHROSCOPY Right 06/25/2014   Procedure: ARTHROSCOPY KNEE;  Surgeon: Earnestine Leys, MD;  Location: [facility] ORS;  Service: Orthopedics;  Laterality: Right;  partial arthroscopic medial menisectomy   LAPAROSCOPIC APPENDECTOMY N/A 06/02/2021   Procedure: APPENDECTOMY LAPAROSCOPIC;  Surgeon: Ronny Bacon, MD;  Location: [facility] ORS;  Service: General;  Laterality: N/A;   TOTAL KNEE ARTHROPLASTY Right 04/22/2015   Procedure: TOTAL KNEE ARTHROPLASTY;  Surgeon: Earnestine Leys, MD;  Location: [facility] ORS;  Service: Orthopedics;  Laterality: Right;   TOTAL KNEE ARTHROPLASTY Left 10/30/2017   Procedure: TOTAL KNEE ARTHROPLASTY;  Surgeon: Earnestine Leys, MD;  Location: [facility] ORS;  Service: Orthopedics;  Laterality: Left;   TOTAL KNEE REVISION Left 01/02/2018   Procedure: poly exchange of tibia and patella left knee;  Surgeon: Earnestine Leys, MD;  Location: [facility] ORS;  Service:  Orthopedics;  Laterality: Left;   UMBILICAL HERNIA REPAIR  06/02/2021   Procedure: HERNIA REPAIR UMBILICAL ADULT;  Surgeon: Ronny Bacon, MD;  Location: [facility] ORS;  Service: General;;   Family History:  Family History  Problem Relation Age of Onset   CVA Mother        deceased at age 67   Depression Brother        Died by suicide at age 75   Family Psychiatric  History: see above Social  History:  Social History   Substance and Sexual Activity  Alcohol Use Yes     Social History   Substance and Sexual Activity  Drug Use Yes   Types: Marijuana    Social History   Socioeconomic History   Marital status: Single    Spouse name: Not on file   Number of children: Not on file   Years of education: Not on file   Highest education level: Not on file  Occupational History   Not on file  Tobacco Use   Smoking status: Former    Packs/day: 0.75    Years: 20.00    Pack years: 15.00    Types: Cigarettes    Quit date: 05/16/1984    Years since quitting: 37.0   Smokeless tobacco: Never  Vaping Use   Vaping Use: Never used  Substance and Sexual Activity   Alcohol use: Yes   Drug use: Yes    Types: Marijuana   Sexual activity: Not on file  Other Topics Concern   Not on file  Social History Narrative   Not on file   Social Determinants of Health   Financial Resource Strain: Not on file  Food Insecurity: Not on file  Transportation Needs: Not on file  Physical Activity: Not on file  Stress: Not on file  Social Connections: Not on file   Additional Social History:    Allergies:  No Active Allergies  Labs: No results found for this or any previous visit (from the past 48 hour(s)).  Current Facility-Administered Medications  Medication Dose Route Frequency Provider Last Rate Last Admin   0.9 %  sodium chloride infusion   Intravenous PRN Jennye Boroughs, MD   Stopped at 06/03/21 0720   acetaminophen (TYLENOL) tablet 1,000 mg  1,000 mg Oral Q8H Ronny Bacon, MD   1,000 mg at 06/09/21 [id number]   Or   acetaminophen (TYLENOL) suppository 650 mg  650 mg Rectal Q8H Ronny Bacon, MD       alum & mag hydroxide-simeth (MAALOX/MYLANTA) 200-200-20 MG/5ML suspension 15 mL  15 mL Oral Q6H PRN Ronny Bacon, MD   15 mL at 06/04/21 1045   bisacodyl (DULCOLAX) suppository 10 mg  10 mg Rectal Daily Ronny Bacon, MD   10 mg at 06/04/21 1045   cefadroxil (DURICEF)  capsule 500 mg  500 mg Oral BID Jennye Boroughs, MD   500 mg at 06/08/21 2148   doxepin (SINEQUAN) capsule 100 mg  100 mg Oral QHS Ronny Bacon, MD   100 mg at 06/08/21 2149   enalapril (VASOTEC) tablet 10 mg  10 mg Oral Daily Ronny Bacon, MD   10 mg at 06/08/21 1028   enoxaparin (LOVENOX) injection 40 mg  40 mg Subcutaneous Q24H Ronny Bacon, MD   40 mg at 06/08/21 2148   famotidine (PEPCID) tablet 20 mg  20 mg Oral Daily Ronny Bacon, MD   20 mg at 06/08/21 1028   FLUoxetine (PROZAC) capsule 60 mg  60 mg Oral Daily Ronny Bacon,  MD   60 mg at 06/08/21 1030   gabapentin (NEURONTIN) capsule 400 mg  400 mg Oral TID Ronny Bacon, MD   400 mg at 06/08/21 2147   ibuprofen (ADVIL) tablet 400 mg  400 mg Oral Q8H Ronny Bacon, MD   400 mg at 06/09/21 0533   magnesium hydroxide (MILK OF MAGNESIA) suspension 30 mL  30 mL Oral Daily PRN Ronny Bacon, MD   30 mL at 06/05/21 1019   magnesium hydroxide (MILK OF MAGNESIA) suspension 30 mL  30 mL Oral QHS Ronny Bacon, MD   30 mL at 06/08/21 2146   morphine (PF) 4 MG/ML injection 4 mg  4 mg Intravenous Q4H PRN Ronny Bacon, MD   4 mg at 06/03/21 1839   naltrexone (DEPADE) tablet 50 mg  50 mg Oral Daily Ronny Bacon, MD   50 mg at 06/08/21 1029   OLANZapine (ZYPREXA) tablet 10 mg  10 mg Oral QHS Ronny Bacon, MD   10 mg at 06/08/21 2148   OLANZapine (ZYPREXA) tablet 5 mg  5 mg Oral Daily Ronny Bacon, MD   5 mg at 06/08/21 1027   ondansetron (ZOFRAN) tablet 4 mg  4 mg Oral Q6H PRN Ronny Bacon, MD       Or   ondansetron [facility]) injection 4 mg  4 mg Intravenous Q6H PRN Ronny Bacon, MD       oxyCODONE (Oxy IR/ROXICODONE) immediate release tablet 10 mg  10 mg Oral Q4H PRN Jennye Boroughs, MD   10 mg at 06/08/21 2020   oxyCODONE (Oxy IR/ROXICODONE) immediate release tablet 5 mg  5 mg Oral Q4H PRN Ronny Bacon, MD   5 mg at 06/09/21 0411   pantoprazole (PROTONIX) EC tablet 40 mg  40 mg Oral Daily  Ronny Bacon, MD   40 mg at 06/08/21 1027   polyethylene glycol (MIRALAX / GLYCOLAX) packet 17 g  17 g Oral Daily Ronny Bacon, MD   17 g at 06/06/21 0900   senna-docusate (Senokot-S) tablet 1 tablet  1 tablet Oral QHS Ronny Bacon, MD   1 tablet at 06/08/21 2147   sodium phosphate (FLEET) 7-19 GM/118ML enema 1 enema  1 enema Rectal Daily PRN Ronny Bacon, MD       traZODone (DESYREL) tablet 25 mg  25 mg Oral QHS PRN Ronny Bacon, MD   25 mg at 06/07/21 2159   traZODone (DESYREL) tablet 25 mg  25 mg Oral TID PRN Ronny Bacon, MD   25 mg at 06/09/21 0410    Musculoskeletal: Strength & Muscle Tone: within normal limits Gait & Station: normal Patient leans: N/A Psychiatric Specialty Exam: Physical Exam Vitals and nursing note reviewed.  Constitutional:      Appearance: Normal appearance.  HENT:     Head: Normocephalic.     Nose: Nose normal.  Pulmonary:     Effort: Pulmonary effort is normal.  Musculoskeletal:        General: Normal range of motion.     Cervical back: Normal range of motion.  Neurological:     General: No focal deficit present.     Mental Status: He is alert and oriented to person, place, and time.  Psychiatric:        Attention and Perception: Attention and perception normal.        Mood and Affect: Mood is anxious and depressed.        Speech: Speech normal.        Behavior: Behavior normal. Behavior is cooperative.  Thought Content: Thought content normal.        Cognition and Memory: Cognition and memory normal.        Judgment: Judgment normal.    Review of Systems  Gastrointestinal:  Positive for abdominal pain.  Psychiatric/Behavioral:  Positive for depression. The patient is nervous/anxious.   All other systems reviewed and are negative.  Blood pressure (!) 134/92, pulse 85, temperature (!) 97.5 F (36.4 C), temperature source (P) Oral, resp. rate 18, height 5\' 8"  (1.727 m), weight 81.6 kg, SpO2 96 %.Body mass index is  27.37 kg/m.  General Appearance: Casual  Eye Contact:  Good  Speech:  Normal Rate  Volume:  Normal  Mood:  Anxious and Depressed  Affect:  Congruent  Thought Process:  Coherent and Descriptions of Associations: Intact  Orientation:  Full (Time, Place, and Person)  Thought Content:  WDL and Logical  Suicidal Thoughts:  No  Homicidal Thoughts:  No  Memory:  Immediate;   Good Recent;   Good Remote;   Good  Judgement:  Fair  Insight:  Good  Psychomotor Activity:  Normal  Concentration:  Concentration: Good and Attention Span: Good  Recall:  Good  Fund of Knowledge:  Good  Language:  Good  Akathisia:  No  Handed:  Right  AIMS (if indicated):     Assets:  Leisure Time Physical Health Resilience Social Support  ADL's:  Intact  Cognition:  WNL  Sleep:        Physical Exam: Physical Exam Vitals and nursing note reviewed.  Constitutional:      Appearance: Normal appearance.  HENT:     Head: Normocephalic.     Nose: Nose normal.  Pulmonary:     Effort: Pulmonary effort is normal.  Musculoskeletal:        General: Normal range of motion.     Cervical back: Normal range of motion.  Neurological:     General: No focal deficit present.     Mental Status: He is alert and oriented to person, place, and time.  Psychiatric:        Attention and Perception: Attention and perception normal.        Mood and Affect: Mood is anxious and depressed.        Speech: Speech normal.        Behavior: Behavior normal. Behavior is cooperative.        Thought Content: Thought content normal.        Cognition and Memory: Cognition and memory normal.        Judgment: Judgment normal.   Review of Systems  Gastrointestinal:  Positive for abdominal pain.  Psychiatric/Behavioral:  Positive for depression. The patient is nervous/anxious.   All other systems reviewed and are negative. Blood pressure (!) 134/92, pulse 85, temperature (!) 97.5 F (36.4 C), temperature source (P) Oral, resp. rate  18, height 5\' 8"  (1.727 m), weight 81.6 kg, SpO2 96 %. Body mass index is 27.37 kg/m.  Treatment Plan Summary: Major depressive disorder, recurrent,mild to moderate  Disposition: No evidence of imminent risk to self or others at present.   Patient does not meet criteria for psychiatric inpatient admission.  Waylan Boga, NP 06/09/2021 9:47 AM

## 2021-06-09 NOTE — Discharge Summary (Signed)
Physician Discharge Summary   Patient: [name] MRN: [id number] DOB: 29-Jul-1960  Admit date:     05/29/2021  Discharge date: 06/09/21  Discharge Physician: Jennye Boroughs   PCP: Ranae Plumber, PA   Recommendations at discharge:   Follow-up with [facility] in [place] in 1 week Follow-up with general surgeon in 1 week Follow-up with PCP in 1 week Outpatient follow-up of 1.3 cm right kidney mass with ultrasound in 6 months  Discharge Diagnoses: Principal Problem:   Bipolar disorder ([place]) Active Problems:   Major depressive disorder, recurrent episode, moderate ([place])   Cellulitis of right hand   Effusion of bursa of left knee   Essential hypertension   Cluster B personality disorder ([place])   Depression   Acute appendicitis  Resolved Problems:   * No resolved hospital problems. *  Hospital Course:  61 y.o. male with PMHx of bipolar disorder, anxiety, GERD, hypertension, urolithiasis and PTSD, who presented to the ER on 05/29/2021 with worsening right hand swelling and pain that started swelling a couple weeks ago after having a piece of glass on his third MCP.  He also reported that he had started having swelling in his left knee 4-5 days prior.  Noted hx of where he had his left TKA in [id number] that was complicated then by infection with septic arthritis and required washout procedure.  He was seen in the ER 3 days prior to this presentation for his left knee pain and was discharged on Keflex and Bactrim after negative work-up.     Orthopedic surgery was consulted who recommended admission for IV antibiotics and close monitoring for both the left knee effusion and right hand cellulitis with associated third MCP tenosynovitis.   He complained of right lower quadrant abdominal pain while hospitalized.. CT abdomen pelvis revealed acute appendicitis. He was started on empiric IV antibiotics for this.  He underwent laparoscopic appendectomy.    While in the PACU, patient expressed  suicidal thoughts.  Psychiatrist was consulted to assist with management and and he was placed under one-to-one watch for safety.  Psychiatrist recommended transfer to behavioral health unit for further management.  Unfortunately, there were no beds available so transfer to behavioral health unit was delayed.  He was reevaluated by the psychiatrist and psychiatrist felt that he was no longer a threat to self or others.  He was cleared for discharge from psychiatric perspective.  He is deemed medically stable for discharge to home.           Consultants: Psychiatrist, general surgeon, orthopedic surgeon Procedures performed: Laparoscopic appendectomy, I&D of right hand cellulitis/abscess Disposition: Home Diet recommendation:  Discharge Diet Orders (From admission, onward)     Start     Ordered   06/09/21 0000  Diet - low sodium heart healthy        06/09/21 1033           Cardiac diet DISCHARGE MEDICATION: Allergies as of 06/09/2021   No Active Allergies      Medication List     STOP taking these medications    lithium carbonate 300 MG CR tablet Commonly known as: LITHOBID   sulfamethoxazole-trimethoprim 800-160 MG tablet Commonly known as: BACTRIM DS       TAKE these medications    cephALEXin 500 MG capsule Commonly known as: KEFLEX Take 1 capsule (500 mg total) by mouth 4 (four) times daily for 2 days.   doxepin 100 MG capsule Commonly known as: SINEQUAN Take 1 capsule (100 mg total) by  mouth at bedtime.   enalapril 10 MG tablet Commonly known as: VASOTEC Take 1 tablet (10 mg total) by mouth daily.   FLUoxetine 20 MG capsule Commonly known as: PROZAC Take 3 capsules (60 mg total) by mouth daily. What changed: Another medication with the same name was removed. Continue taking this medication, and follow the directions you see here.   gabapentin 400 MG capsule Commonly known as: NEURONTIN Take 1 capsule (400 mg total) by mouth 3 (three) times daily.    naltrexone 50 MG tablet Commonly known as: DEPADE Take 50 mg by mouth daily.   naproxen 500 MG tablet Commonly known as: Naprosyn Take 1 tablet (500 mg total) by mouth 2 (two) times daily with a meal.   OLANZapine 5 MG tablet Commonly known as: ZYPREXA Take 5 mg by mouth daily. What changed: Another medication with the same name was changed. Make sure you understand how and when to take each.   OLANZapine 10 MG tablet Commonly known as: ZYPREXA Take 1 tablet (10 mg total) by mouth at bedtime. What changed: when to take this   pantoprazole 40 MG tablet Commonly known as: Protonix Take 1 tablet (40 mg total) by mouth daily.   traZODone 50 MG tablet Commonly known as: DESYREL Take 0.5 tablets (25 mg total) by mouth 3 (three) times daily as needed (Anxiety).        Follow-up Information     Tylene Fantasia, PA-C. Schedule an appointment as soon as possible for a visit in 2 week(s).   Specialty: Physician Assistant Contact information: [address] [place] [address] [phone]         [place]. Schedule an appointment as soon as possible for a visit in 1 week(s).   Contact information: [place] [address] [phone]                Discharge Exam: Filed Weights   05/29/21 1253  Weight: 81.6 kg   GEN: NAD SKIN: Warm and dry EYES: No pallor or icterus ENT: MMM CV: RRR PULM: CTA B ABD: soft, ND, NT, +BS CNS: AAO x 3, non focal EXT: No edema or tenderness PSYCH: Calm and cooperative  Condition at discharge: good  The results of significant diagnostics from this hospitalization (including imaging, microbiology, ancillary and laboratory) are listed below for reference.   Imaging Studies: CT ABDOMEN PELVIS W CONTRAST  Result Date: 06/02/2021 CLINICAL DATA:  Right lower quadrant abdominal pain. EXAM: CT ABDOMEN AND PELVIS WITH CONTRAST TECHNIQUE: Multidetector CT imaging of the abdomen and  pelvis was performed using the standard protocol following bolus administration of intravenous contrast. RADIATION DOSE REDUCTION: This exam was performed according to the departmental dose-optimization program which includes automated exposure control, adjustment of the mA and/or kV according to patient size and/or use of iterative reconstruction technique. CONTRAST:  158mL OMNIPAQUE IOHEXOL 300 MG/ML  SOLN COMPARISON:  CT abdomen and pelvis 04/06/2020 and 01/27/2016; MRI abdomen 04/07/2020 FINDINGS: Lower chest: Mild motion artifact limits evaluation of the lung bases. No gross abnormality. Hepatobiliary: Smooth liver contours. Mildly decreased density throughout the liver suggesting fatty infiltration. Tiny low-density focus adjacent to the gallbladder fossa unchanged and possibly a tiny cyst. The gallbladder is unremarkable. Pancreas: Unremarkable. No pancreatic ductal dilatation or surrounding inflammatory changes. Spleen: Normal in size without focal abnormality. Adrenals/Urinary Tract: Normal adrenals. The kidneys enhance uniformly and are symmetrically chicken size without hydronephrosis. There multiple small low-attenuation lesions within the bilateral kidneys. Stable 13  mm low-attenuation lesion within the posterior inferior right kidney (axial series 2, image 41) demonstrates internal density of 28 Hounsfield units. Again this may represent a solid mass, and renal cell carcinoma cannot be excluded. This is again increased in size from approximately 8 mm on 01/27/2016 CT. Left lower pole 1.4 cm (axial image 42) fluid attenuation cyst is slightly increased from 11 mm previously. Just anterior to this, there is a 12 mm low-attenuation lesion with internal density of 14 Hounsfield units, previously 11 mm, compatible with an additional cyst. No focal urinary bladder wall thickening is seen. Stomach/Bowel: There is high-grade stool seen throughout all portions of the colon increased from prior and suggesting  constipation. The terminal ileum is unremarkable. The appendix is enlarged measuring up to 15 mm in caliber, and demonstrates mild-to-moderate surrounding inflammatory fat stranding and wall thickening. There are 2 mid to distal appendiceal appendicoliths, with the larger more proximal appendicolith measuring up to 7 mm (axial series 2 images 57 through 63). No dilated loops of bowel to indicate bowel obstruction. Vascular/Lymphatic: Mild atherosclerotic calcifications. No abdominal aortic aneurysm. The major intra-abdominal aortic branch vessels are patent. Reproductive: The prostate and seminal vesicles are unremarkable. Other: There is a fat containing umbilical hernia measuring up to 3.0 cm in transverse dimension similar to prior. No free air or free fluid is seen within the abdomen or pelvis. Musculoskeletal: No acute skeletal abnormality. IMPRESSION: 1. Acute appendicitis.  No perforation or abscess is seen. 2. Within the posterior lower pole of the right kidney there is a 13 mm low-attenuation lesion of the is not qualify as a simple cyst. This is stable in size from 04/06/2020 CT but increased in size from 01/27/2016 CT. This remains indeterminate for a complex cyst versus solid mass on 04/07/2020 MRI. The stability over the past 13 months is reassuring for a benign process. Consider follow-up ultrasound in 6 months for surveillance. 3. High-grade stool throughout all portions of the colon suggesting constipation. Electronically Signed   By: Yvonne Kendall M.D.   On: 06/02/2021 15:18   CT HAND RIGHT W CONTRAST  Result Date: 05/29/2021 CLINICAL DATA:  Right hand swelling. Soft tissue infection suspected, hand, xray done EXAM: CT OF THE UPPER RIGHT EXTREMITY WITH CONTRAST TECHNIQUE: Multidetector CT imaging of the upper right extremity was performed according to the standard protocol following intravenous contrast administration. RADIATION DOSE REDUCTION: This exam was performed according to the  departmental dose-optimization program which includes automated exposure control, adjustment of the mA and/or kV according to patient size and/or use of iterative reconstruction technique. CONTRAST:  122mL OMNIPAQUE IOHEXOL 300 MG/ML  SOLN COMPARISON:  X-ray 05/28/2021 FINDINGS: Bones/Joint/Cartilage No acute fracture. No dislocation. Mild degenerative changes of the hand, most pronounced at the first MCP joint. No erosion or periosteal elevation. No large joint effusion is evident by CT. Ligaments Suboptimally assessed by CT. Muscles and Tendons Large volume of focal tenosynovitis involving the long finger extensor tendon centered at the third MCP joint measuring up to 3.0 x 0.8 x 1.9 cm (series 3, images 39-52). No additional tenosynovial fluid collections are evident. No intramuscular fluid collection identified. Soft tissues Diffuse soft tissue swelling with ill-defined fluid over the dorsum of the hand. No organized or rim enhancing fluid collection within the soft tissues. IMPRESSION: 1. Large volume of focal tenosynovitis involving the long finger extensor tendon centered at the third MCP joint measuring up to 3.0 x 0.8 x 1.9 cm. Findings concerning for septic tenosynovitis given the history. 2. Diffuse  soft tissue swelling with ill-defined fluid over the dorsum of the hand, suggestive of cellulitis. No organized or rim enhancing fluid collection within the soft tissues. 3. No evidence of osteomyelitis. Electronically Signed   By: Davina Poke D.O.   On: 05/29/2021 17:13   CT KNEE LEFT W CONTRAST  Result Date: 05/29/2021 CLINICAL DATA:  Left knee swelling for 3 days EXAM: CT OF THE LEFT KNEE WITH CONTRAST TECHNIQUE: Multidetector CT imaging was performed following the standard protocol during bolus administration of intravenous contrast. RADIATION DOSE REDUCTION: This exam was performed according to the departmental dose-optimization program which includes automated exposure control, adjustment of the  mA and/or kV according to patient size and/or use of iterative reconstruction technique. CONTRAST:  171mL OMNIPAQUE IOHEXOL 300 MG/ML  SOLN COMPARISON:  X-ray 05/27/2021, CT 12/31/2017 FINDINGS: Bones/Joint/Cartilage Postsurgical changes from left total knee arthroplasty. Metallic streak artifact from hardware slightly degrades evaluation of the adjacent structures. Within this limitation, there is no evidence of periprosthetic fracture or loosening. Remaining osseous structures appear within normal limits. No evidence of erosion or periosteal elevation. Large knee joint effusion with mild diffuse synovial thickening. Ligaments Suboptimally assessed by CT. Muscles and Tendons Musculotendinous structures appear within normal limits by CT. Soft tissues No significant periarticular soft tissue findings. IMPRESSION: 1. Large knee joint effusion with mild diffuse synovial thickening, nonspecific in the postoperative setting. If there is clinical suspicion for infection, arthrocentesis should be performed to assess for septic arthritis. 2. 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 Electronically Signed   By: Davina Poke D.O.   On: 05/29/2021 17:03   US Venous Img Lower Unilateral Left  Result Date: 05/29/2021 CLINICAL DATA:  Swelling and pain for 2 weeks EXAM: LEFT LOWER EXTREMITY VENOUS DOPPLER ULTRASOUND TECHNIQUE: Gray-scale sonography with compression, as well as color and duplex ultrasound, were performed to evaluate the deep venous system(s) from the level of the common femoral vein through the popliteal and proximal calf veins. COMPARISON:  None FINDINGS: VENOUS Normal compressibility of the common femoral, superficial femoral, and popliteal veins, as well as the visualized calf veins. Visualized portions of profunda femoral vein and great saphenous vein unremarkable. No filling defects to suggest DVT on grayscale or color Doppler  imaging. Doppler waveforms show normal direction of venous flow, normal respiratory plasticity and response to augmentation. Limited views of the contralateral common femoral vein are unremarkable. OTHER None. Limitations: none IMPRESSION: 1. No left lower extremity DVT 2. Nonspecific mildly enlarged left inguinal lymph node. Follow-up ultrasound should be performed in 4-6 weeks to document resolution. Electronically Signed   By: Miachel Roux M.D.   On: 05/29/2021 18:17   DG Knee Complete 4 Views Left  Result Date: 05/27/2021 CLINICAL DATA:  Left knee pain and swelling. EXAM: LEFT KNEE - COMPLETE 4+ VIEW COMPARISON:  January 17, 2021. FINDINGS: Status post left total knee arthroplasty. The femoral, tibial and patellar components are well situated. No fracture or dislocation is noted. IMPRESSION: No acute abnormality seen. Electronically Signed   By: Marijo Conception M.D.   On: 05/27/2021 11:03   DG Hand Complete Right  Result Date: 05/28/2021 CLINICAL DATA:  Right hand swelling without known injury. EXAM: RIGHT HAND - COMPLETE 3+ VIEW COMPARISON:  None Available. FINDINGS: There is no evidence of fracture or dislocation. There is no evidence of arthropathy or other focal bone abnormality. Dorsal soft tissue swelling is noted. IMPRESSION: No fracture or dislocation is noted. Dorsal soft tissue swelling  is noted which may be inflammatory in etiology. Electronically Signed   By: Marijo Conception M.D.   On: 05/28/2021 08:54    Microbiology: Results for orders placed or performed during the hospital encounter of 05/29/21  Culture, blood (routine x 2)     Status: None   Collection Time: 05/29/21  3:42 PM   Specimen: BLOOD  Result Value Ref Range Status   Specimen Description BLOOD LFOA  Final   Special Requests BOTTLES DRAWN AEROBIC AND ANAEROBIC BCAV  Final   Culture   Final    NO GROWTH 5 DAYS Performed at [facility], [address], [place], [place] [address]    Report Status 06/03/2021  FINAL  Final  Culture, blood (routine x 2)     Status: None   Collection Time: 05/29/21  3:47 PM   Specimen: BLOOD  Result Value Ref Range Status   Specimen Description BLOOD [facility]  Final   Special Requests BOTTLES DRAWN AEROBIC AND ANAEROBIC BCLV  Final   Culture   Final    NO GROWTH 5 DAYS Performed at [facility], [address] [place], [place] [address]    Report Status 06/03/2021 FINAL  Final  Chlamydia/NGC rt PCR ([place] only)     Status: None   Collection Time: 05/29/21  7:06 PM   Specimen: Urine  Result Value Ref Range Status   Specimen source GC/Chlam URINE, RANDOM  Final   Chlamydia Tr NOT DETECTED NOT DETECTED Final   N gonorrhoeae NOT DETECTED NOT DETECTED Final    Comment: (NOTE) This CT/NG assay has not been evaluated in patients with a history of  hysterectomy. Performed at [facility], [address] [place], [place] [address]   Aerobic/Anaerobic Culture w Gram Stain (surgical/deep wound)     Status: None   Collection Time: 05/31/21  2:54 PM   Specimen: Wound  Result Value Ref Range Status   Specimen Description   Final    WOUND WD Performed at [facility], [address], [place], [place] [address]    Special Requests   Final    NONE Performed at [facility], [address] [place], [place] [address]    Gram Stain   Final    FEW WBC PRESENT, PREDOMINANTLY PMN NO ORGANISMS SEEN    Culture   Final    RARE STAPHYLOCOCCUS AUREUS NO ANAEROBES ISOLATED Performed at [facility], [place] [address] [place], [place] [address]    Report Status 06/05/2021 FINAL  Final   Organism ID, Bacteria STAPHYLOCOCCUS AUREUS  Final      Susceptibility   Staphylococcus aureus - MIC*    CIPROFLOXACIN <=0.5 SENSITIVE Sensitive     ERYTHROMYCIN <=0.25 SENSITIVE Sensitive     GENTAMICIN <=0.5 SENSITIVE Sensitive     OXACILLIN 0.5 SENSITIVE Sensitive     TETRACYCLINE <=1 SENSITIVE Sensitive     VANCOMYCIN <=0.5 SENSITIVE  Sensitive     TRIMETH/SULFA <=10 SENSITIVE Sensitive     CLINDAMYCIN <=0.25 SENSITIVE Sensitive     RIFAMPIN <=0.5 SENSITIVE Sensitive     Inducible Clindamycin NEGATIVE Sensitive     * RARE STAPHYLOCOCCUS AUREUS    Labs: CBC: Recent Labs  Lab 06/03/21 0536  WBC 9.2  HGB 13.0  HCT 38.8*  MCV 87.2  PLT [id number]   Basic Metabolic Panel: No results for input(s): NA, K, CL, CO2, GLUCOSE, BUN, CREATININE, CALCIUM, MG, PHOS in the last 168 hours. Liver Function Tests: No results for  input(s): AST, ALT, ALKPHOS, BILITOT, PROT, ALBUMIN in the last 168 hours. CBG: No results for input(s): GLUCAP in the last 168 hours.  Discharge time spent: greater than 30 minutes.  Signed: Jennye Boroughs, MD [facility] 06/09/2021

## 2021-06-09 NOTE — Plan of Care (Signed)
Patient discharged per MD orders at this time.All discharge instructions,education and medications reviewed with the patient.Pt expressed understanding and will comply with dc instructions.follow up appointments was also communicated to the patient.no verbal c/o or any ssx of distress.Pt was discharged home with self-care per order.patient was transported home by friend in a privately owned vehicle.

## 2021-06-09 NOTE — Plan of Care (Signed)
Patient is compliant with his care plan.

## 2021-06-09 NOTE — TOC Progression Note (Signed)
Transition of Care [facility]) - Progression Note    Patient Details  Name: [name] MRN: [id number] Date of Birth: 09-14-1960  Transition of Care [facility]) CM/SW [place], RN Phone Number: 06/09/2021, 9:33 AM  Clinical Narrative:    Continues to await a bed at [facility]        Expected Discharge Plan and Services                                                 Social Determinants of Health (SDOH) Interventions    Readmission Risk Interventions     View : No data to display.

## 2021-06-12 ENCOUNTER — Inpatient Hospital Stay
Admission: EM | Admit: 2021-06-12 | Discharge: 2021-06-18 | DRG: 871 | Disposition: A | Discharge status: Other Acute Inpatient Hospital | Payer: Medicaid Other | Attending: Hospitalist | Admitting: Hospitalist

## 2021-06-12 ENCOUNTER — Other Ambulatory Visit: Payer: Self-pay

## 2021-06-12 DIAGNOSIS — Z8619 Personal history of other infectious and parasitic diseases: Secondary | ICD-10-CM

## 2021-06-12 DIAGNOSIS — A09 Infectious gastroenteritis and colitis, unspecified: Secondary | ICD-10-CM | POA: Diagnosis present

## 2021-06-12 DIAGNOSIS — N179 Acute kidney failure, unspecified: Secondary | ICD-10-CM

## 2021-06-12 DIAGNOSIS — F331 Major depressive disorder, recurrent, moderate: Secondary | ICD-10-CM | POA: Diagnosis present

## 2021-06-12 DIAGNOSIS — K219 Gastro-esophageal reflux disease without esophagitis: Secondary | ICD-10-CM | POA: Diagnosis present

## 2021-06-12 DIAGNOSIS — Z96653 Presence of artificial knee joint, bilateral: Secondary | ICD-10-CM | POA: Diagnosis present

## 2021-06-12 DIAGNOSIS — Z818 Family history of other mental and behavioral disorders: Secondary | ICD-10-CM

## 2021-06-12 DIAGNOSIS — Z5901 Sheltered homelessness: Secondary | ICD-10-CM

## 2021-06-12 DIAGNOSIS — Z87442 Personal history of urinary calculi: Secondary | ICD-10-CM

## 2021-06-12 DIAGNOSIS — G2581 Restless legs syndrome: Secondary | ICD-10-CM | POA: Diagnosis present

## 2021-06-12 DIAGNOSIS — F6089 Other specific personality disorders: Secondary | ICD-10-CM | POA: Diagnosis present

## 2021-06-12 DIAGNOSIS — F609 Personality disorder, unspecified: Secondary | ICD-10-CM | POA: Diagnosis present

## 2021-06-12 DIAGNOSIS — R531 Weakness: Secondary | ICD-10-CM | POA: Diagnosis present

## 2021-06-12 DIAGNOSIS — I10 Essential (primary) hypertension: Secondary | ICD-10-CM | POA: Diagnosis present

## 2021-06-12 DIAGNOSIS — Z9049 Acquired absence of other specified parts of digestive tract: Secondary | ICD-10-CM

## 2021-06-12 DIAGNOSIS — Z87891 Personal history of nicotine dependence: Secondary | ICD-10-CM

## 2021-06-12 DIAGNOSIS — Z823 Family history of stroke: Secondary | ICD-10-CM

## 2021-06-12 DIAGNOSIS — F1410 Cocaine abuse, uncomplicated: Secondary | ICD-10-CM | POA: Diagnosis present

## 2021-06-12 DIAGNOSIS — F1010 Alcohol abuse, uncomplicated: Secondary | ICD-10-CM | POA: Diagnosis present

## 2021-06-12 DIAGNOSIS — Z91138 Patient's unintentional underdosing of medication regimen for other reason: Secondary | ICD-10-CM

## 2021-06-12 DIAGNOSIS — Z79899 Other long term (current) drug therapy: Secondary | ICD-10-CM

## 2021-06-12 DIAGNOSIS — E875 Hyperkalemia: Secondary | ICD-10-CM | POA: Diagnosis present

## 2021-06-12 DIAGNOSIS — M542 Cervicalgia: Secondary | ICD-10-CM | POA: Diagnosis present

## 2021-06-12 DIAGNOSIS — R339 Retention of urine, unspecified: Secondary | ICD-10-CM | POA: Diagnosis not present

## 2021-06-12 DIAGNOSIS — R6521 Severe sepsis with septic shock: Secondary | ICD-10-CM | POA: Diagnosis present

## 2021-06-12 DIAGNOSIS — T43226A Underdosing of selective serotonin reuptake inhibitors, initial encounter: Secondary | ICD-10-CM | POA: Diagnosis present

## 2021-06-12 DIAGNOSIS — M6282 Rhabdomyolysis: Secondary | ICD-10-CM

## 2021-06-12 DIAGNOSIS — J9811 Atelectasis: Secondary | ICD-10-CM | POA: Diagnosis present

## 2021-06-12 DIAGNOSIS — Z1152 Encounter for screening for COVID-19: Secondary | ICD-10-CM

## 2021-06-12 DIAGNOSIS — Y909 Presence of alcohol in blood, level not specified: Secondary | ICD-10-CM | POA: Diagnosis present

## 2021-06-12 DIAGNOSIS — K828 Other specified diseases of gallbladder: Secondary | ICD-10-CM | POA: Diagnosis present

## 2021-06-12 DIAGNOSIS — F4310 Post-traumatic stress disorder, unspecified: Secondary | ICD-10-CM | POA: Diagnosis present

## 2021-06-12 DIAGNOSIS — F1110 Opioid abuse, uncomplicated: Secondary | ICD-10-CM | POA: Diagnosis present

## 2021-06-12 DIAGNOSIS — A419 Sepsis, unspecified organism: Principal | ICD-10-CM | POA: Diagnosis present

## 2021-06-12 DIAGNOSIS — R45851 Suicidal ideations: Secondary | ICD-10-CM | POA: Diagnosis present

## 2021-06-12 NOTE — ED Notes (Signed)
Pt. Was provided with hospital attire, scrubs pant and top. This Probation officer along with EDT Eustaquio Maize, and [place] in room, dressed out the pt. Belongings were placed in a hospital bag that was provided. Green sticker, with patient label was placed on the bag. Bag 1 of 1.  Belongings are: Pair of [employer] Medicine Bottle

## 2021-06-12 NOTE — ED Triage Notes (Signed)
Patient to [facility] via stretcher by EMS from local motel.  Per EMS patient had complained of abdominal pain and suicidal ideations.

## 2021-06-12 NOTE — Consult Note (Incomplete)
[facility] Consult   Reason for Consult:  *** Referring Physician:  *** Patient Identification: [name] MRN:  [id number] Principal Diagnosis: <principal problem not specified> Diagnosis:  Active Problems:   * No active hospital problems. *   Total Time spent with patient: {Time; 15 min - 8 hours:17441}  Subjective:   [name] is a 61 y.o. male patient admitted with ***.  HPI:  ***  Past Psychiatric History: ***  Risk to Self:   Risk to Others:   Prior Inpatient Therapy:   Prior Outpatient Therapy:    Past Medical History:  Past Medical History:  Diagnosis Date  . Anxiety   . Arthritis    knees and hands  . Bipolar disorder ([place])   . Depression   . GERD (gastroesophageal reflux disease)   . Hepatitis    HEP "C"  . History of kidney stones   . Hypertension   . 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 Kidney stones   . Pericarditis 05/2015   a. echo 5/17: EF 60-65%, no RWMA, LV dias fxn nl, LA mildly dilated, RV sys fxn nl, PASP nl, moderate sized circumferential pericardial effusion was identified, 2.12 cm around the LV free wall, <1 cm around the RV free wall. Features were not c/w tamponade physiology  . PTSD (post-traumatic stress disorder)    Witnessed brother's suicide.  Marland Kitchen Restless leg syndrome   . Syncope     Past Surgical History:  Procedure Laterality Date  . CYSTOSCOPY WITH URETEROSCOPY AND STENT PLACEMENT    . ESOPHAGOGASTRODUODENOSCOPY N/A 01/11/2016   Procedure: ESOPHAGOGASTRODUODENOSCOPY (EGD);  Surgeon: Wilford Corner, MD;  Location: [facility] ENDOSCOPY;  Service: Endoscopy;  Laterality: N/A;  . ESOPHAGOGASTRODUODENOSCOPY N/A 04/09/2020   Procedure: ESOPHAGOGASTRODUODENOSCOPY (EGD);  Surgeon: Jonathon Bellows, MD;  Location: [facility] ENDOSCOPY;  Service: Gastroenterology;  Laterality: N/A;  . INCISION AND DRAINAGE ABSCESS Left 01/02/2018   Procedure: INCISION AND DRAINAGE LEFT KNEE;  Surgeon: Earnestine Leys, MD;   Location: [facility] ORS;  Service: Orthopedics;  Laterality: Left;  . JOINT REPLACEMENT Right    TKR  . KNEE ARTHROSCOPY Right 06/25/2014   Procedure: ARTHROSCOPY KNEE;  Surgeon: Earnestine Leys, MD;  Location: [facility] ORS;  Service: Orthopedics;  Laterality: Right;  partial arthroscopic medial menisectomy  . LAPAROSCOPIC APPENDECTOMY N/A 06/02/2021   Procedure: APPENDECTOMY LAPAROSCOPIC;  Surgeon: Ronny Bacon, MD;  Location: [facility] ORS;  Service: General;  Laterality: N/A;  . TOTAL KNEE ARTHROPLASTY Right 04/22/2015   Procedure: TOTAL KNEE ARTHROPLASTY;  Surgeon: Earnestine Leys, MD;  Location: [facility] ORS;  Service: Orthopedics;  Laterality: Right;  . TOTAL KNEE ARTHROPLASTY Left 10/30/2017   Procedure: TOTAL KNEE ARTHROPLASTY;  Surgeon: Earnestine Leys, MD;  Location: [facility] ORS;  Service: Orthopedics;  Laterality: Left;  . TOTAL KNEE REVISION Left 01/02/2018   Procedure: poly exchange of tibia and patella left knee;  Surgeon: Earnestine Leys, MD;  Location: [facility] ORS;  Service: Orthopedics;  Laterality: Left;  . UMBILICAL HERNIA REPAIR  06/02/2021   Procedure: HERNIA REPAIR UMBILICAL ADULT;  Surgeon: Ronny Bacon, MD;  Location: [facility] ORS;  Service: General;;   Family History:  Family History  Problem Relation Age of Onset  . CVA Mother        deceased at age 32  . Depression Brother        Died by suicide at age 11   Family Psychiatric  History: *** Social History:  Social History   Substance and Sexual Activity  Alcohol Use Yes  Social History   Substance and Sexual Activity  Drug Use Yes  . Types: Marijuana    Social History   Socioeconomic History  . Marital status: Single    Spouse name: Not on file  . Number of children: Not on file  . Years of education: Not on file  . Highest education level: Not on file  Occupational History  . Not on file  Tobacco Use  . Smoking status: Former    Packs/day: 0.75    Years: 20.00    Pack years: 15.00    Types: Cigarettes    Quit  date: 05/16/1984    Years since quitting: 37.0  . Smokeless tobacco: Never  Vaping Use  . Vaping Use: Never used  Substance and Sexual Activity  . Alcohol use: Yes  . Drug use: Yes    Types: Marijuana  . Sexual activity: Not on file  Other Topics Concern  . Not on file  Social History Narrative  . Not on file   Social Determinants of Health   Financial Resource Strain: Not on file  Food Insecurity: Not on file  Transportation Needs: Not on file  Physical Activity: Not on file  Stress: Not on file  Social Connections: Not on file   Additional Social History:    Allergies:  No Known Allergies  Labs: No results found for this or any previous visit (from the past 48 hour(s)).  No current facility-administered medications for this encounter.   Current Outpatient Medications  Medication Sig Dispense Refill  . doxepin (SINEQUAN) 100 MG capsule Take 1 capsule (100 mg total) by mouth at bedtime. 30 capsule 3  . enalapril (VASOTEC) 10 MG tablet Take 1 tablet (10 mg total) by mouth daily. 30 tablet 0  . FLUoxetine (PROZAC) 20 MG capsule Take 3 capsules (60 mg total) by mouth daily. 90 capsule 0  . gabapentin (NEURONTIN) 400 MG capsule Take 1 capsule (400 mg total) by mouth 3 (three) times daily. 90 capsule 3  . naltrexone (DEPADE) 50 MG tablet Take 50 mg by mouth daily.    . naproxen (NAPROSYN) 500 MG tablet Take 1 tablet (500 mg total) by mouth 2 (two) times daily with a meal. 60 tablet 0  . OLANZapine (ZYPREXA) 10 MG tablet Take 1 tablet (10 mg total) by mouth at bedtime. 30 tablet 0  . OLANZapine (ZYPREXA) 5 MG tablet Take 5 mg by mouth daily.    . pantoprazole (PROTONIX) 40 MG tablet Take 1 tablet (40 mg total) by mouth daily. 30 tablet 1  . traZODone (DESYREL) 50 MG tablet Take 0.5 tablets (25 mg total) by mouth 3 (three) times daily as needed (Anxiety). 90 tablet 3    Musculoskeletal: Strength & Muscle Tone: {desc; muscle tone:32375} Gait & Station: {PE GAIT ED  [id number] Patient leans: {Patient Leans:[id number]}            Psychiatric Specialty Exam:  Presentation  General Appearance: Disheveled; Casual  Eye Contact:Fair  Speech:Clear and Coherent  Speech Volume:Normal  Handedness:Right   Mood and Affect  Mood:Anxious; Depressed; Hopeless  Affect:Appropriate; Congruent; Depressed   Thought Process  Thought Processes:Coherent  Descriptions of Associations:Intact  Orientation:Full (Time, Place and Person)  Thought Content:Logical; WDL  History of Schizophrenia/Schizoaffective disorder:No  Duration of Psychotic Symptoms:N/A  Hallucinations:No data recorded Ideas of Reference:None  Suicidal Thoughts:No data recorded Homicidal Thoughts:No data recorded  Sensorium  Memory:Immediate [place]  Insight:Fair   Executive Functions  Concentration:Fair  Attention Span:Fair  [place] of  Knowledge:Fair  Language:Fair   Psychomotor Activity  Psychomotor Activity:No data recorded  Assets  Assets:Communication Skills; Desire for Improvement   Sleep  Sleep:No data recorded  Physical Exam: Physical Exam Vitals and nursing note reviewed.   ROS Blood pressure (!) 127/99, pulse 97, temperature 97.6 F (36.4 C), resp. rate 18, height 5\' 9"  (1.753 m), weight 81 kg, SpO2 94 %. Body mass index is 26.37 kg/m.  Treatment Plan Summary: {CHL [facility] MD [place] [id number]  Disposition: {CHL [facility] Consult [id number]  Deloria Lair, NP 06/12/2021 11:55 PM

## 2021-06-12 NOTE — ED Notes (Signed)
Patient reports that he hurts all over, especially in his abdomen.  Patient also reports that he has fallen recently.  Patient reports recent crack and alcohol usage.  Patient will answer one question and then become silent, will answer other questions irritably after prodding.

## 2021-06-13 ENCOUNTER — Inpatient Hospital Stay: Payer: Medicaid Other

## 2021-06-13 ENCOUNTER — Emergency Department: Payer: Medicaid Other

## 2021-06-13 ENCOUNTER — Encounter: Payer: Self-pay | Admitting: Radiology

## 2021-06-13 DIAGNOSIS — A09 Infectious gastroenteritis and colitis, unspecified: Secondary | ICD-10-CM | POA: Diagnosis present

## 2021-06-13 DIAGNOSIS — Z1152 Encounter for screening for COVID-19: Secondary | ICD-10-CM | POA: Diagnosis not present

## 2021-06-13 DIAGNOSIS — F1410 Cocaine abuse, uncomplicated: Secondary | ICD-10-CM | POA: Diagnosis present

## 2021-06-13 DIAGNOSIS — R45851 Suicidal ideations: Secondary | ICD-10-CM | POA: Diagnosis present

## 2021-06-13 DIAGNOSIS — Z96653 Presence of artificial knee joint, bilateral: Secondary | ICD-10-CM | POA: Diagnosis present

## 2021-06-13 DIAGNOSIS — M542 Cervicalgia: Secondary | ICD-10-CM | POA: Diagnosis present

## 2021-06-13 DIAGNOSIS — M6282 Rhabdomyolysis: Secondary | ICD-10-CM

## 2021-06-13 DIAGNOSIS — R6521 Severe sepsis with septic shock: Principal | ICD-10-CM | POA: Diagnosis present

## 2021-06-13 DIAGNOSIS — A419 Sepsis, unspecified organism: Secondary | ICD-10-CM | POA: Diagnosis present

## 2021-06-13 DIAGNOSIS — F4310 Post-traumatic stress disorder, unspecified: Secondary | ICD-10-CM | POA: Diagnosis present

## 2021-06-13 DIAGNOSIS — Z5901 Sheltered homelessness: Secondary | ICD-10-CM | POA: Diagnosis not present

## 2021-06-13 DIAGNOSIS — R339 Retention of urine, unspecified: Secondary | ICD-10-CM | POA: Diagnosis not present

## 2021-06-13 DIAGNOSIS — K219 Gastro-esophageal reflux disease without esophagitis: Secondary | ICD-10-CM | POA: Diagnosis present

## 2021-06-13 DIAGNOSIS — N179 Acute kidney failure, unspecified: Secondary | ICD-10-CM

## 2021-06-13 DIAGNOSIS — K828 Other specified diseases of gallbladder: Secondary | ICD-10-CM | POA: Diagnosis present

## 2021-06-13 DIAGNOSIS — Y909 Presence of alcohol in blood, level not specified: Secondary | ICD-10-CM | POA: Diagnosis present

## 2021-06-13 DIAGNOSIS — F331 Major depressive disorder, recurrent, moderate: Secondary | ICD-10-CM | POA: Diagnosis present

## 2021-06-13 DIAGNOSIS — G2581 Restless legs syndrome: Secondary | ICD-10-CM | POA: Diagnosis present

## 2021-06-13 DIAGNOSIS — R531 Weakness: Secondary | ICD-10-CM | POA: Diagnosis present

## 2021-06-13 DIAGNOSIS — T43226A Underdosing of selective serotonin reuptake inhibitors, initial encounter: Secondary | ICD-10-CM | POA: Diagnosis present

## 2021-06-13 DIAGNOSIS — I10 Essential (primary) hypertension: Secondary | ICD-10-CM | POA: Diagnosis present

## 2021-06-13 DIAGNOSIS — J9811 Atelectasis: Secondary | ICD-10-CM | POA: Diagnosis present

## 2021-06-13 DIAGNOSIS — F1010 Alcohol abuse, uncomplicated: Secondary | ICD-10-CM | POA: Diagnosis present

## 2021-06-13 DIAGNOSIS — E875 Hyperkalemia: Secondary | ICD-10-CM | POA: Diagnosis present

## 2021-06-13 DIAGNOSIS — F1110 Opioid abuse, uncomplicated: Secondary | ICD-10-CM | POA: Diagnosis present

## 2021-06-13 LAB — CBC
HCT: 30.1 % — ABNORMAL LOW (ref 39.0–52.0)
Hemoglobin: 10.2 g/dL — ABNORMAL LOW (ref 13.0–17.0)
MCH: 29.1 pg (ref 26.0–34.0)
MCHC: 33.9 g/dL (ref 30.0–36.0)
MCV: 85.8 fL (ref 80.0–100.0)
Platelets: 210 10*3/uL (ref 150–400)
RBC: 3.51 MIL/uL — ABNORMAL LOW (ref 4.22–5.81)
RDW: 16.5 % — ABNORMAL HIGH (ref 11.5–15.5)
WBC: 15 10*3/uL — ABNORMAL HIGH (ref 4.0–10.5)
nRBC: 0 % (ref 0.0–0.2)

## 2021-06-13 LAB — BASIC METABOLIC PANEL
Anion gap: 20 — ABNORMAL HIGH (ref 5–15)
Anion gap: 16 — ABNORMAL HIGH (ref 5–15)
Anion gap: 15 (ref 5–15)
Anion gap: 15 (ref 5–15)
BUN: 89 mg/dL — ABNORMAL HIGH (ref 6–20)
BUN: 80 mg/dL — ABNORMAL HIGH (ref 6–20)
BUN: 82 mg/dL — ABNORMAL HIGH (ref 6–20)
BUN: 77 mg/dL — ABNORMAL HIGH (ref 6–20)
CO2: 18 mmol/L — ABNORMAL LOW (ref 22–32)
CO2: 17 mmol/L — ABNORMAL LOW (ref 22–32)
CO2: 18 mmol/L — ABNORMAL LOW (ref 22–32)
CO2: 20 mmol/L — ABNORMAL LOW (ref 22–32)
Calcium: 8.3 mg/dL — ABNORMAL LOW (ref 8.9–10.3)
Calcium: 8.1 mg/dL — ABNORMAL LOW (ref 8.9–10.3)
Calcium: 7.7 mg/dL — ABNORMAL LOW (ref 8.9–10.3)
Calcium: 8 mg/dL — ABNORMAL LOW (ref 8.9–10.3)
Chloride: 95 mmol/L — ABNORMAL LOW (ref 98–111)
Chloride: 101 mmol/L (ref 98–111)
Chloride: 103 mmol/L (ref 98–111)
Chloride: 101 mmol/L (ref 98–111)
Creatinine, Ser: 7.18 mg/dL — ABNORMAL HIGH (ref 0.61–1.24)
Creatinine, Ser: 6.33 mg/dL — ABNORMAL HIGH (ref 0.61–1.24)
Creatinine, Ser: 6.05 mg/dL — ABNORMAL HIGH (ref 0.61–1.24)
Creatinine, Ser: 5.66 mg/dL — ABNORMAL HIGH (ref 0.61–1.24)
GFR, Estimated: 8 mL/min — ABNORMAL LOW (ref >60)
GFR, Estimated: 10 mL/min — ABNORMAL LOW (ref >60)
GFR, Estimated: 10 mL/min — ABNORMAL LOW (ref >60)
GFR, Estimated: 11 mL/min — ABNORMAL LOW (ref >60)
Glucose, Bld: 138 mg/dL — ABNORMAL HIGH (ref 70–99)
Glucose, Bld: 122 mg/dL — ABNORMAL HIGH (ref 70–99)
Glucose, Bld: 131 mg/dL — ABNORMAL HIGH (ref 70–99)
Glucose, Bld: 120 mg/dL — ABNORMAL HIGH (ref 70–99)
Potassium: 5.7 mmol/L — ABNORMAL HIGH (ref 3.5–5.1)
Potassium: 4.1 mmol/L (ref 3.5–5.1)
Potassium: 4.4 mmol/L (ref 3.5–5.1)
Potassium: 4.3 mmol/L (ref 3.5–5.1)
Sodium: 133 mmol/L — ABNORMAL LOW (ref 135–145)
Sodium: 134 mmol/L — ABNORMAL LOW (ref 135–145)
Sodium: 136 mmol/L (ref 135–145)
Sodium: 136 mmol/L (ref 135–145)

## 2021-06-13 LAB — URINE DRUG SCREEN, QUALITATIVE (ARMC ONLY)
Amphetamines, Ur Screen: NOT DETECTED
Barbiturates, Ur Screen: NOT DETECTED
Benzodiazepine, Ur Scrn: NOT DETECTED
Cannabinoid 50 Ng, Ur ~~LOC~~: NOT DETECTED
Cocaine Metabolite,Ur ~~LOC~~: POSITIVE — AB
MDMA (Ecstasy)Ur Screen: NOT DETECTED
Methadone Scn, Ur: NOT DETECTED
Opiate, Ur Screen: POSITIVE — AB
Phencyclidine (PCP) Ur S: NOT DETECTED
Tricyclic, Ur Screen: POSITIVE — AB

## 2021-06-13 LAB — MAGNESIUM
Magnesium: 2.4 mg/dL (ref 1.7–2.4)
Magnesium: 2.1 mg/dL (ref 1.7–2.4)

## 2021-06-13 LAB — CBC WITH DIFFERENTIAL/PLATELET
Abs Immature Granulocytes: 0.1 10*3/uL — ABNORMAL HIGH (ref 0.00–0.07)
Basophils Absolute: 0.1 10*3/uL (ref 0.0–0.1)
Basophils Relative: 0 %
Eosinophils Absolute: 0 10*3/uL (ref 0.0–0.5)
Eosinophils Relative: 0 %
HCT: 33.9 % — ABNORMAL LOW (ref 39.0–52.0)
Hemoglobin: 11 g/dL — ABNORMAL LOW (ref 13.0–17.0)
Immature Granulocytes: 1 %
Lymphocytes Relative: 3 %
Lymphs Abs: 0.6 10*3/uL — ABNORMAL LOW (ref 0.7–4.0)
MCH: 28.4 pg (ref 26.0–34.0)
MCHC: 32.4 g/dL (ref 30.0–36.0)
MCV: 87.4 fL (ref 80.0–100.0)
Monocytes Absolute: 1.7 10*3/uL — ABNORMAL HIGH (ref 0.1–1.0)
Monocytes Relative: 8 %
Neutro Abs: 18 10*3/uL — ABNORMAL HIGH (ref 1.7–7.7)
Neutrophils Relative %: 88 %
Platelets: 247 10*3/uL (ref 150–400)
RBC: 3.88 MIL/uL — ABNORMAL LOW (ref 4.22–5.81)
RDW: 16.5 % — ABNORMAL HIGH (ref 11.5–15.5)
WBC: 20.4 10*3/uL — ABNORMAL HIGH (ref 4.0–10.5)
nRBC: 0 % (ref 0.0–0.2)

## 2021-06-13 LAB — COMPREHENSIVE METABOLIC PANEL
ALT: 56 U/L — ABNORMAL HIGH (ref 0–44)
AST: 98 U/L — ABNORMAL HIGH (ref 15–41)
Albumin: 3.6 g/dL (ref 3.5–5.0)
Alkaline Phosphatase: 61 U/L (ref 38–126)
Anion gap: 19 — ABNORMAL HIGH (ref 5–15)
BUN: 82 mg/dL — ABNORMAL HIGH (ref 6–20)
CO2: 18 mmol/L — ABNORMAL LOW (ref 22–32)
Calcium: 8.3 mg/dL — ABNORMAL LOW (ref 8.9–10.3)
Chloride: 95 mmol/L — ABNORMAL LOW (ref 98–111)
Creatinine, Ser: 7.15 mg/dL — ABNORMAL HIGH (ref 0.61–1.24)
GFR, Estimated: 8 mL/min — ABNORMAL LOW (ref >60)
Glucose, Bld: 146 mg/dL — ABNORMAL HIGH (ref 70–99)
Potassium: 4.9 mmol/L (ref 3.5–5.1)
Sodium: 132 mmol/L — ABNORMAL LOW (ref 135–145)
Total Bilirubin: 5.8 mg/dL — ABNORMAL HIGH (ref 0.3–1.2)
Total Protein: 7.5 g/dL (ref 6.5–8.1)

## 2021-06-13 LAB — URINALYSIS, ROUTINE W REFLEX MICROSCOPIC
Bilirubin Urine: NEGATIVE
Glucose, UA: NEGATIVE mg/dL
Ketones, ur: NEGATIVE mg/dL
Leukocytes,Ua: NEGATIVE
Nitrite: NEGATIVE
Protein, ur: NEGATIVE mg/dL
Specific Gravity, Urine: 1.009 (ref 1.005–1.030)
Squamous Epithelial / HPF: NONE SEEN (ref 0–5)
pH: 5 (ref 5.0–8.0)

## 2021-06-13 LAB — GLUCOSE, CAPILLARY
Glucose-Capillary: 121 mg/dL — ABNORMAL HIGH (ref 70–99)
Glucose-Capillary: 120 mg/dL — ABNORMAL HIGH (ref 70–99)
Glucose-Capillary: 98 mg/dL (ref 70–99)
Glucose-Capillary: 125 mg/dL — ABNORMAL HIGH (ref 70–99)
Glucose-Capillary: 127 mg/dL — ABNORMAL HIGH (ref 70–99)

## 2021-06-13 LAB — BLOOD GAS, VENOUS
Acid-base deficit: 6.9 mmol/L — ABNORMAL HIGH (ref 0.0–2.0)
Bicarbonate: 18.5 mmol/L — ABNORMAL LOW (ref 20.0–28.0)
O2 Saturation: 85.4 %
Patient temperature: 37
pCO2, Ven: 36 mmHg — ABNORMAL LOW (ref 44–60)
pH, Ven: 7.32 (ref 7.25–7.43)
pO2, Ven: 52 mmHg — ABNORMAL HIGH (ref 32–45)

## 2021-06-13 LAB — PROCALCITONIN: Procalcitonin: 2.85 ng/mL

## 2021-06-13 LAB — CK
Total CK: 3464 U/L — ABNORMAL HIGH (ref 49–397)
Total CK: 2164 U/L — ABNORMAL HIGH (ref 49–397)

## 2021-06-13 LAB — SALICYLATE LEVEL: Salicylate Lvl: <7 mg/dL — ABNORMAL LOW (ref 7.0–30.0)

## 2021-06-13 LAB — LACTIC ACID, PLASMA
Lactic Acid, Venous: 1.5 mmol/L (ref 0.5–1.9)
Lactic Acid, Venous: 2.4 mmol/L (ref 0.5–1.9)

## 2021-06-13 LAB — LIPASE, BLOOD: Lipase: 30 U/L (ref 11–51)

## 2021-06-13 LAB — PHOSPHORUS
Phosphorus: 9.4 mg/dL — ABNORMAL HIGH (ref 2.5–4.6)
Phosphorus: 8.5 mg/dL — ABNORMAL HIGH (ref 2.5–4.6)

## 2021-06-13 LAB — ETHANOL: Alcohol, Ethyl (B): <10 mg/dL (ref <10)

## 2021-06-13 LAB — POTASSIUM: Potassium: 4.1 mmol/L (ref 3.5–5.1)

## 2021-06-13 LAB — ACETAMINOPHEN LEVEL: Acetaminophen (Tylenol), Serum: <10 ug/mL — ABNORMAL LOW (ref 10–30)

## 2021-06-13 MED ORDER — ONDANSETRON HCL 4 MG/2ML IJ SOLN
4.0000 mg | INTRAMUSCULAR | Status: AC
  Administered 2021-06-13: 4 mg via INTRAVENOUS
  Filled 2021-06-13: qty 2

## 2021-06-13 MED ORDER — MIDODRINE HCL 5 MG PO TABS
5.0000 mg | ORAL_TABLET | Freq: Three times a day (TID) | ORAL | Status: DC
  Administered 2021-06-13 – 2021-06-15 (×6): 5 mg via ORAL
  Filled 2021-06-13 (×6): qty 1

## 2021-06-13 MED ORDER — LACTATED RINGERS IV BOLUS (SEPSIS)
500.0000 mL | Freq: Once | INTRAVENOUS | Status: AC
  Administered 2021-06-13: 500 mL via INTRAVENOUS

## 2021-06-13 MED ORDER — LACTATED RINGERS IV BOLUS (SEPSIS)
1000.0000 mL | Freq: Once | INTRAVENOUS | Status: AC
  Administered 2021-06-13: 1000 mL via INTRAVENOUS

## 2021-06-13 MED ORDER — SODIUM CHLORIDE 0.9 % IV SOLN
250.0000 mL | INTRAVENOUS | Status: DC
  Administered 2021-06-13 – 2021-06-14 (×2): 250 mL via INTRAVENOUS

## 2021-06-13 MED ORDER — DOCUSATE SODIUM 100 MG PO CAPS: 100.0000 mg | ORAL_CAPSULE | Freq: Two times a day (BID) | ORAL | Status: DC | PRN

## 2021-06-13 MED ORDER — SODIUM CHLORIDE 0.9 % IV SOLN
2.0000 g | INTRAVENOUS | Status: DC
  Administered 2021-06-13: 2 g via INTRAVENOUS
  Filled 2021-06-13: qty 12.5

## 2021-06-13 MED ORDER — PANTOPRAZOLE SODIUM 40 MG IV SOLR
40.0000 mg | Freq: Every day | INTRAVENOUS | Status: DC
Start: 2021-06-13 — End: 2021-06-14
  Administered 2021-06-13: 40 mg via INTRAVENOUS
  Filled 2021-06-13: qty 10

## 2021-06-13 MED ORDER — FLUOXETINE HCL 20 MG PO CAPS
60.0000 mg | ORAL_CAPSULE | Freq: Every day | ORAL | Status: DC
  Administered 2021-06-13 – 2021-06-18 (×6): 60 mg via ORAL
  Filled 2021-06-13 (×6): qty 3

## 2021-06-13 MED ORDER — IOHEXOL 300 MG/ML  SOLN: 100.0000 mL | Freq: Once | INTRAMUSCULAR | Status: DC | PRN

## 2021-06-13 MED ORDER — SODIUM CHLORIDE 0.9 % IV SOLN
1.0000 g | INTRAVENOUS | Status: AC
  Administered 2021-06-13: 1 g via INTRAVENOUS
  Filled 2021-06-13: qty 10

## 2021-06-13 MED ORDER — LACTATED RINGERS IV BOLUS: 1000.0000 mL | Freq: Once | INTRAVENOUS | Status: DC

## 2021-06-13 MED ORDER — OLANZAPINE 5 MG PO TABS
5.0000 mg | ORAL_TABLET | Freq: Every day | ORAL | Status: DC
  Administered 2021-06-14 – 2021-06-18 (×5): 5 mg via ORAL
  Filled 2021-06-13 (×6): qty 1

## 2021-06-13 MED ORDER — MORPHINE SULFATE (PF) 2 MG/ML IV SOLN
1.0000 mg | INTRAVENOUS | Status: DC | PRN
  Administered 2021-06-13 – 2021-06-15 (×6): 2 mg via INTRAVENOUS
  Filled 2021-06-13 (×6): qty 1

## 2021-06-13 MED ORDER — OXYCODONE-ACETAMINOPHEN 5-325 MG PO TABS
1.0000 | ORAL_TABLET | Freq: Four times a day (QID) | ORAL | Status: DC | PRN
  Administered 2021-06-13 – 2021-06-15 (×5): 2 via ORAL
  Administered 2021-06-15: 1 via ORAL
  Administered 2021-06-16: 2 via ORAL
  Filled 2021-06-13 (×8): qty 2

## 2021-06-13 MED ORDER — VANCOMYCIN HCL 2000 MG/400ML IV SOLN
2000.0000 mg | Freq: Once | INTRAVENOUS | Status: AC
Start: 2021-06-13 — End: 2021-06-13
  Administered 2021-06-13: 2000 mg via INTRAVENOUS
  Filled 2021-06-13: qty 400

## 2021-06-13 MED ORDER — SODIUM CHLORIDE 0.9 % IV SOLN
2.0000 g | INTRAVENOUS | Status: DC
  Administered 2021-06-14 – 2021-06-15 (×2): 2 g via INTRAVENOUS
  Filled 2021-06-13 (×3): qty 20

## 2021-06-13 MED ORDER — MORPHINE SULFATE (PF) 4 MG/ML IV SOLN
4.0000 mg | Freq: Once | INTRAVENOUS | Status: AC
  Administered 2021-06-13: 4 mg via INTRAVENOUS
  Filled 2021-06-13: qty 1

## 2021-06-13 MED ORDER — METRONIDAZOLE 500 MG/100ML IV SOLN
500.0000 mg | Freq: Once | INTRAVENOUS | Status: AC
  Administered 2021-06-13: 500 mg via INTRAVENOUS
  Filled 2021-06-13: qty 100

## 2021-06-13 MED ORDER — SODIUM BICARBONATE 8.4 % IV SOLN
INTRAVENOUS | Status: DC
  Filled 2021-06-13: qty 1000
  Filled 2021-06-13: qty 150
  Filled 2021-06-13 (×3): qty 1000

## 2021-06-13 MED ORDER — DEXTROSE-NACL 5-0.9 % IV SOLN
INTRAVENOUS | Status: DC
Start: 2021-06-13 — End: 2021-06-13

## 2021-06-13 MED ORDER — FOLIC ACID 1 MG PO TABS
1.0000 mg | ORAL_TABLET | Freq: Every day | ORAL | Status: DC
  Administered 2021-06-13 – 2021-06-18 (×6): 1 mg via ORAL
  Filled 2021-06-13 (×6): qty 1

## 2021-06-13 MED ORDER — THIAMINE HCL 100 MG/ML IJ SOLN
100.0000 mg | Freq: Once | INTRAMUSCULAR | Status: AC
Start: 2021-06-13 — End: 2021-06-13
  Administered 2021-06-13: 100 mg via INTRAVENOUS
  Filled 2021-06-13: qty 2

## 2021-06-13 MED ORDER — SODIUM ZIRCONIUM CYCLOSILICATE 10 G PO PACK
10.0000 g | PACK | ORAL | Status: AC
  Administered 2021-06-13: 10 g via ORAL
  Filled 2021-06-13: qty 1

## 2021-06-13 MED ORDER — NOREPINEPHRINE 4 MG/250ML-% IV SOLN
2.0000 ug/min | INTRAVENOUS | Status: DC
  Administered 2021-06-13: 2 ug/min via INTRAVENOUS
  Filled 2021-06-13: qty 250

## 2021-06-13 MED ORDER — POLYETHYLENE GLYCOL 3350 17 G PO PACK: 17.0000 g | PACK | Freq: Every day | ORAL | Status: DC | PRN

## 2021-06-13 MED ORDER — OLANZAPINE 10 MG PO TABS
10.0000 mg | ORAL_TABLET | Freq: Every day | ORAL | Status: DC
  Administered 2021-06-13 – 2021-06-17 (×5): 10 mg via ORAL
  Filled 2021-06-13 (×6): qty 1

## 2021-06-13 MED ORDER — ADULT MULTIVITAMIN W/MINERALS CH
1.0000 | ORAL_TABLET | Freq: Every day | ORAL | Status: DC
  Administered 2021-06-13 – 2021-06-18 (×6): 1 via ORAL
  Filled 2021-06-13 (×6): qty 1

## 2021-06-13 MED ORDER — THIAMINE HCL 100 MG PO TABS
100.0000 mg | ORAL_TABLET | Freq: Every day | ORAL | Status: DC
  Administered 2021-06-13 – 2021-06-18 (×6): 100 mg via ORAL
  Filled 2021-06-13 (×6): qty 1

## 2021-06-13 MED ORDER — HEPARIN SODIUM (PORCINE) 5000 UNIT/ML IJ SOLN
5000.0000 [IU] | Freq: Three times a day (TID) | INTRAMUSCULAR | Status: DC
  Administered 2021-06-13 – 2021-06-18 (×17): 5000 [IU] via SUBCUTANEOUS
  Filled 2021-06-13 (×17): qty 1

## 2021-06-13 MED ORDER — TRAZODONE HCL 50 MG PO TABS: 25.0000 mg | ORAL_TABLET | Freq: Three times a day (TID) | ORAL | Status: DC | PRN

## 2021-06-13 MED ORDER — CHLORHEXIDINE GLUCONATE CLOTH 2 % EX PADS
6.0000 | MEDICATED_PAD | Freq: Every day | CUTANEOUS | Status: DC
  Administered 2021-06-14 – 2021-06-17 (×4): 6 via TOPICAL

## 2021-06-13 NOTE — Progress Notes (Signed)
CODE SEPSIS - PHARMACY COMMUNICATION  **Broad Spectrum Antibiotics should be administered within 1 hour of Sepsis diagnosis**  Time Code Sepsis Called/Page Received: 0111  Antibiotics Ordered: Ceftriaxone and Flagyl  Time of 1st antibiotic administration: Al[place], PharmD, MBA 06/13/2021 1:12 AM

## 2021-06-13 NOTE — Sepsis Progress Note (Signed)
Elink following for Sepsis Protocol 

## 2021-06-13 NOTE — Progress Notes (Signed)
An USGPIV (ultrasound guided PIV) has been placed for short-term vasopressor infusion. A correctly placed ivWatch must be used when administering Vasopressors. Should this treatment be needed beyond 72 hours, central line access should be obtained.  It will be the responsibility of the bedside nurse to follow best practice to prevent extravasations.   ?

## 2021-06-13 NOTE — ED Notes (Signed)
Patient transported to CT 

## 2021-06-13 NOTE — Plan of Care (Signed)
Patient complains of generalized pain, especially right arm which he describes as 10/10, and unrelieved at all by morphine 2 mg IV, and left hip pain, with "spasms". He is also very drowsy, but oriented, and responds to all commands, but weak. He also has difficulty holding a cup for bedside swallow screen, and has jerking/spasms of all extremities. Pt was unable to void, in and out cathed for 1850 cc. Receiving sodium bicarb drip at 125 cc per hour, and Levophed drip at 5 mcg/min via US guided right forearm PIV. Unable to titate levophed off thus far this shift. Also receiving IV antibiotics. Pt currently being evaluated by SLP for swallowing.   Problem: Education: Goal: Knowledge of General Education information will improve Description: Including pain rating scale, medication(s)/side effects and non-pharmacologic comfort measures Outcome: Progressing   Problem: Health Behavior/Discharge Planning: Goal: Ability to manage health-related needs will improve Outcome: Progressing   Problem: Clinical Measurements: Goal: Ability to maintain clinical measurements within normal limits will improve Outcome: Progressing Goal: Will remain free from infection Outcome: Progressing Goal: Diagnostic test results will improve Outcome: Progressing Goal: Respiratory complications will improve Outcome: Progressing Goal: Cardiovascular complication will be avoided Outcome: Progressing   Problem: Activity: Goal: Risk for activity intolerance will decrease Outcome: Progressing   Problem: Nutrition: Goal: Adequate nutrition will be maintained Outcome: Progressing   Problem: Coping: Goal: Level of anxiety will decrease Outcome: Progressing   Problem: Elimination: Goal: Will not experience complications related to bowel motility Outcome: Progressing Goal: Will not experience complications related to urinary retention Outcome: Progressing   Problem: Pain Managment: Goal: General experience of  comfort will improve Outcome: Progressing   Problem: Safety: Goal: Ability to remain free from injury will improve Outcome: Progressing   Problem: Skin Integrity: Goal: Risk for impaired skin integrity will decrease Outcome: Progressing

## 2021-06-13 NOTE — Evaluation (Signed)
Clinical/Bedside Swallow Evaluation Patient Details  Name: [name] MRN: [id number] Date of Birth: 1960-04-18  Today's Date: 06/13/2021 Time: SLP Start Time (ACUTE ONLY): 1340 SLP Stop Time (ACUTE ONLY): 1355 SLP Time Calculation (min) (ACUTE ONLY): 15 min  Past Medical History:  Past Medical History:  Diagnosis Date   Anxiety    Arthritis    knees and hands   Bipolar disorder ([facility])    Depression    GERD (gastroesophageal reflux disease)    Hepatitis    HEP "C"   History of kidney stones    Hypertension    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 echo 5/17: EF 60-65%, no RWMA, LV dias fxn nl, LA mildly dilated, RV sys fxn nl, PASP nl, moderate sized circumferential pericardial effusion was identified, 2.12 cm around the LV free wall, <1 cm around the RV free wall. Features were not c/w tamponade physiology   PTSD (post-traumatic stress disorder)    Witnessed brother's suicide.   Restless leg syndrome    Syncope    Past Surgical History:  Past Surgical History:  Procedure Laterality Date   CYSTOSCOPY WITH URETEROSCOPY AND STENT PLACEMENT     ESOPHAGOGASTRODUODENOSCOPY N/A 01/11/2016   Procedure: ESOPHAGOGASTRODUODENOSCOPY (EGD);  Surgeon: Charlott Rakes, MD;  Location: [facility] ENDOSCOPY;  Service: Endoscopy;  Laterality: N/A;   ESOPHAGOGASTRODUODENOSCOPY N/A 04/09/2020   Procedure: ESOPHAGOGASTRODUODENOSCOPY (EGD);  Surgeon: [name], MD;  Location: [facility] ENDOSCOPY;  Service: Gastroenterology;  Laterality: N/A;   INCISION AND DRAINAGE ABSCESS Left 01/02/2018   Procedure: INCISION AND DRAINAGE LEFT KNEE;  Surgeon: Deeann Saint, MD;  Location: [facility] ORS;  Service: Orthopedics;  Laterality: Left;   JOINT REPLACEMENT Right    TKR   KNEE ARTHROSCOPY Right 06/25/2014   Procedure: ARTHROSCOPY KNEE;  Surgeon: Deeann Saint, MD;  Location: [facility] ORS;  Service: Orthopedics;  Laterality: Right;  partial arthroscopic medial  menisectomy   LAPAROSCOPIC APPENDECTOMY N/A 06/02/2021   Procedure: APPENDECTOMY LAPAROSCOPIC;  Surgeon: Campbell Lerner, MD;  Location: [facility] ORS;  Service: General;  Laterality: N/A;   TOTAL KNEE ARTHROPLASTY Right 04/22/2015   Procedure: TOTAL KNEE ARTHROPLASTY;  Surgeon: Deeann Saint, MD;  Location: [facility] ORS;  Service: Orthopedics;  Laterality: Right;   TOTAL KNEE ARTHROPLASTY Left 10/30/2017   Procedure: TOTAL KNEE ARTHROPLASTY;  Surgeon: Deeann Saint, MD;  Location: [facility] ORS;  Service: Orthopedics;  Laterality: Left;   TOTAL KNEE REVISION Left 01/02/2018   Procedure: poly exchange of tibia and patella left knee;  Surgeon: Deeann Saint, MD;  Location: [facility] ORS;  Service: Orthopedics;  Laterality: Left;   UMBILICAL HERNIA REPAIR  06/02/2021   Procedure: HERNIA REPAIR UMBILICAL ADULT;  Surgeon: Campbell Lerner, MD;  Location: [facility] ORS;  Service: General;;   HPI:  Per H&P "61 yo M presenting to [facility] ED with multiple complaints, from a local motel where he is staying.  Per ED documentation, patient called EMS because of severe abdominal pain over the last several days as well as nausea and vomiting.  He reported it hurts when he moves around and presses on his abdomen. He also reported to EMS that wanted to kill himself. Also per ED documentation he reported dropping his psychiatric medicine on the floor a few days ago at the motel and was not able to collect it again so he has not been taking his medicine for the last couple of days.  Patient initially told the EDP and nursing that he  had fallen and had abdominal pain while playing basketball.  Of note the patient was admitted little over 2 weeks ago for cellulitis of the hand and a knee effusion, subsequently developing acute appendicitis undergoing an appendectomy approximately 9 days ago with a minor umbilical hernia repair by Dr. Claudine Mouton.  He was also expressing suicidal ideation and has a history of bipolar disorder.  During this  hospitalization he was evaluated by psychiatry and was initially going to be admitted to the psychiatry unit but then reevaluated and determined to be appropriate for discharge.     Upon bedside assessment patient complaining of aching severe pain in bilateral hips, making it difficult to lift his legs off the bed or walk.  He reports having to   "walk a lot lately" because he is homeless.  He also stated his dog has been staying at the motel and has been urinating on the floor which he slipped in fell.  However, earlier he also stated he fell in the bathroom and hit his head on the tub.  He is complaining of feeling like he has " whiplash".  And that his right arm neck and head are also hurting.  He denies fever/chills, chest pain, shortness of breath and dysuria.  He does admit to poor p.o. intake recently.  He denies tobacco use, regular alcohol use/recreational drug use.  He reported buying a bottle of vodka on 06/11/2021 but giving it away without having consumed any...   CXR 06/13/21: No acute cardiopulmonary process  CT head without contrast 06/13/2021: Negative for acute intracranial abnormality  CT cervical spine without contrast 06/13/2021: Mild reversal of cervical lordosis with degenerative change.  No acute osseous abnormality"    Assessment / Plan / Recommendation  Clinical Impression  Pt seen for clinical swallowing evaluation. Pt initially lethargic, but able to rouse for POs. LOA improved throughout evaluation. Cleared with RN.   Oral motor examination completed and largely unremarkable with exception of edentulous upper arch and several missing teeth on lower arch. Xerostomia and mildly hoarse vocal quality noted which improved with PO trials.   Pt given trials of solid, pureed, thin liquids, and ice chips. Pt presents with s/sx mild oral dysphagia c/b prolonged mastication of solids. Suspect oral deficits due to dental status. Pharyngeal swallow appeared [facility] per clinical assessment. No over or  subtle s/sx pharyngeal dysphagia noted. To palpation, seemingly adequate laryngeal elevation and seemingly timely swallow initiation. Improvement in vocal quality noted with POs. Pt needed assistance with feeding due to BUE weakness and "jerking" type motions.  Recommend initiation of a mech soft diet with thin liquids and safe swallowing strategies/aspiration precautions as outlined below.   Pt is at increased risk of aspiration/aspiration PNA given dental status, overall deconditioned status, multiple medical comorbidities, and need for assistance with feeding.   SLP to f/u per POC for diet tolerance and clinical swallowing re-evaluation as appropriate.   Pt and RN made aware of results, recommendations, and SLP POC. Pt verbalized understanding/agreement. ?full understanding. Reinforcement of content may be needed.   SLP Visit Diagnosis: Dysphagia, oral phase (R13.11)    Aspiration Risk  Mild aspiration risk    Diet Recommendation Dysphagia 3 (Mech soft);Thin liquid   Medication Administration: Whole meds with liquid (as tolerated) Supervision: Staff to assist with self feeding;Full supervision/cueing for compensatory strategies Compensations: Minimize environmental distractions;Slow rate;Small sips/bites Postural Changes: Seated upright at 90 degrees;Remain upright for at least 30 minutes after po intake    Other  Recommendations Recommended Consults:  (  OT and PT consults) Oral Care Recommendations: Oral care BID    Recommendations for follow up therapy are one component of a multi-disciplinary discharge planning process, led by the attending physician.  Recommendations may be updated based on patient status, additional functional criteria and insurance authorization.  Follow up Recommendations  (TBD)      Assistance Recommended at Discharge Frequent or constant Supervision/Assistance  Functional Status Assessment Patient has had a recent decline in their functional status and  demonstrates the ability to make significant improvements in function in a reasonable and predictable amount of time.  Frequency and Duration min 2x/week  2 weeks       Prognosis Prognosis for Safe Diet Advancement: Fair Barriers to Reach Goals:  (waxing/waning AMS and lethargy)      Swallow Study   General Date of Onset: 06/13/21 HPI: Per H&P "61 yo M [facility] ED with multiple complaints, from a local motel where he is staying.  Per ED documentation, patient called EMS because of severe abdominal pain over the last several days as well as nausea and vomiting.  He reported it hurts when he moves around and presses on his abdomen. He also reported to EMS that wanted to kill himself. Also per ED documentation he reported dropping his psychiatric medicine on the floor a few days ago at the motel and was not able to collect it again so he has not been taking his medicine for the last couple of days.  Patient initially told the EDP and nursing that he had fallen and had abdominal pain while playing basketball.  Of note the patient was admitted little over 2 weeks ago for cellulitis of the hand and a knee effusion, subsequently developing acute appendicitis undergoing an appendectomy approximately 9 days ago with a minor umbilical hernia repair by Dr. Claudine Moutonodenberg.  He was also expressing suicidal ideation and has a history of bipolar disorder.  During this hospitalization he was evaluated by psychiatry and was initially going to be admitted to the psychiatry unit but then reevaluated and determined to be appropriate for discharge.     Upon bedside assessment patient complaining of aching severe pain in bilateral hips, making it difficult to lift his legs off the bed or walk.  He reports having to   "walk a lot lately" because he is homeless.  He also stated his dog has been staying at the motel and has been urinating on the floor which he slipped in fell.  However, earlier he also stated he fell in  the bathroom and hit his head on the tub.  He is complaining of feeling like he has " whiplash".  And that his right arm neck and head are also hurting.  He denies fever/chills, chest pain, shortness of breath and dysuria.  He does admit to poor p.o. intake recently.  He denies tobacco use, regular alcohol use/recreational drug use.  He reported buying a bottle of vodka on 06/11/2021 but giving it away without having consumed any...   CXR 06/13/21: No acute cardiopulmonary process  CT head without contrast 06/13/2021: Negative for acute intracranial abnormality  CT cervical spine without contrast 06/13/2021: Mild reversal of cervical lordosis with degenerative change.  No acute osseous abnormality" Type of Study: Bedside Swallow Evaluation Previous Swallow Assessment: unknown Diet Prior to this Study: NPO Temperature Spikes Noted: No Respiratory Status: Room air History of Recent Intubation: No Behavior/Cognition: Lethargic/Drowsy (LOA improved during course of evaluation) Oral Cavity Assessment: Dry Oral Care Completed by SLP:  Recent completion by staff Oral Cavity - Dentition: Missing dentition Vision: Functional for self-feeding Self-Feeding Abilities: Needs assist;Total assist Patient Positioning: Upright in bed Baseline Vocal Quality: Hoarse;Normal (hoarse initially; improved with PO trials) Volitional Cough: Strong Volitional Swallow: Able to elicit    Oral/Motor/Sensory Function Overall Oral Motor/Sensory Function: Within functional limits   Ice Chips Ice chips: Within functional limits Presentation: Spoon Other Comments: x5   Thin Liquid Thin Liquid: Within functional limits Presentation: Cup;Straw;Self Fed (assistance with feeding/steadying) Other Comments: ~6 oz    Nectar Thick Nectar Thick Liquid: Not tested   Honey Thick Honey Thick Liquid: Not tested   Puree Puree: Within functional limits Presentation: Self Fed;Spoon (assistance for holding cup of applesauce) Other Comments: ~2 oz    Solid     Solid: Impaired Presentation: Self Fed Oral Phase Impairments: Impaired mastication Oral Phase Functional Implications: Impaired mastication Pharyngeal Phase Impairments:  (WFL) Other Comments: x1 saltine cracker     Clyde Canterbury, M.S., CCC-SLP Speech-Language Pathologist [facility] [phone] Arnette Felts)  Woodroe Chen 06/13/2021,3:14 PM

## 2021-06-13 NOTE — H&P (Signed)
NAME:  [name], MRN:  [id number], DOB:  [birth year], LOS: 0 ADMISSION DATE:  06/12/2021, CONSULTATION DATE:  06/13/21 REFERRING MD:  Dr. York Cerise, CHIEF COMPLAINT:  Abdominal pain & psych eval   History of Present Illness:  61 yo M presenting to [facility] ED with multiple complaints, from a local motel where he is staying.  Per ED documentation, patient called EMS because of severe abdominal pain over the last several days as well as nausea and vomiting.  He reported it hurts when he moves around and presses on his abdomen. He also reported to EMS that wanted to kill himself. Also per ED documentation he reported dropping his psychiatric medicine on the floor a few days ago at the motel and was not able to collect it again so he has not been taking his medicine for the last couple of days.  Patient initially told the EDP and nursing that he had fallen and had abdominal pain while playing basketball. Of note the patient was admitted little over 2 weeks ago for cellulitis of the hand and a knee effusion, subsequently developing acute appendicitis undergoing an appendectomy approximately 9 days ago with a minor umbilical hernia repair by Dr. Claudine Mouton.  He was also expressing suicidal ideation and has a history of bipolar disorder.  During this hospitalization he was evaluated by psychiatry and was initially going to be admitted to the psychiatry unit but then reevaluated and determined to be appropriate for discharge.  Upon bedside assessment patient complaining of aching severe pain in bilateral hips, making it difficult to lift his legs off the bed or walk.  He reports having to   "walk a lot lately" because he is homeless.  He also stated his dog has been staying at the motel and has been urinating on the floor which he slipped in fell.  However, earlier he also stated he fell in the bathroom and hit his head on the tub.  He is complaining of feeling like he has " whiplash".  And that his right arm neck  and head are also hurting.  He denies fever/chills, chest pain, shortness of breath and dysuria.  He does admit to poor p.o. intake recently.  He denies tobacco use, regular alcohol use/recreational drug use.  He reported buying a bottle of vodka on 06/11/2021 but giving it away without having consumed any.  ED course: Upon arrival he told EDP that he was frustrated because he has been hurting so long and that he definitely does not want to kill himself, just wants to stop hurting.  Due to concern for postoperative complication CT abdomen pelvis performed showing moderate severity colitis and mild to moderate severity bibasilar atelectasis with a distended gallbladder without evidence of cholelithiasis.  Lab work revealed acute renal failure, AGMA, mild hyperkalemia, leukocytosis and lactic acidosis.  Sepsis protocol initiated.  While in ED after IV fluid resuscitation patient became hypotensive requiring peripheral vasopressor support.  Upon ICU assessment, patient now with new complaints of leg weakness and neck pain with possible fall described.  EDP ordered CT head and CT cervical spine which were unrevealing.  After further discussion MRI ordered for additional work-up of complaints. medications given: Zofran IV, 4 mg morphine IV, 3 L LR bolus, Lokelma, thiamine Initial Vitals: 97.6, 18, 97, 127/99 and SPO2 94% on room air, however within 2 hours of arrival patient hypotensive and tachycardic Significant labs: (Labs/ Imaging personally reviewed) I, Cheryll Cockayne Rust-Chester, AGACNP-BC, personally viewed and interpreted this ECG. EKG Interpretation:  Date: 06/12/21, EKG Time: 23:47, Rate: 95, Rhythm: NSR, QRS Axis:  normal, Intervals: normal, ST/T Wave abnormalities: none, Narrative Interpretation: NSR Chemistry: Na+:133, K+: 5.7, BUN/Cr.: 89/7.18, Serum CO2/ AG: 18/20, AST/ALT: 98/ 56, Mg: 2.4 Hematology: WBC: 20.4, Hgb: 11.0, plt: 247 CK: 3,464, Lactic/ PCT: 2.4 > 1.5 VBG: pending  CXR 06/13/21: No acute  cardiopulmonary process CT head without contrast 06/13/2021: Negative for acute intracranial abnormality CT cervical spine without contrast 06/13/2021: Mild reversal of cervical lordosis with degenerative change.  No acute osseous abnormality  PCCM consulted for admission due to acute renal failure in the setting of rhabdomyolysis and suspected sepsis with shock requiring peripheral vasopressor support.  Pertinent  Medical History  Anxiety & Depression Bipolar disorder Arthritis Hepatitis HTN Pericarditis PTSD Syncope  Significant Hospital Events: Including procedures, antibiotic start and stop dates in addition to other pertinent events   06/13/21: Admit to ICU with suspected sepsis, acute renal failure and shock requiring peripheral vasopressor support  Interim History / Subjective:  Patient alert and responsive, poor historian.  See HPI above  Objective   Blood pressure (!) 88/48, pulse (!) 103, temperature 97.6 F (36.4 C), resp. rate 10, height 5\' 9"  (1.753 m), weight 81 kg, SpO2 98 %.       No intake or output data in the 24 hours ending 06/13/21 0442 Filed Weights   06/12/21 2351  Weight: 81 kg    Examination: General: Adult male, critically ill, lying in bed, NAD HEENT: MM pink/moist, anicteric, atraumatic, neck supple Neuro: A&O x 3, able to follow commands, PERRL +3, MAE- BLE 3/5, RUE- 3/5, LUE-4/5 CV: s1s2 RRR, ST on monitor, no r/m/g Pulm: Regular, non labored on RA , breath sounds clear-BUL & clear/diminished-BLL GI: soft, distended, tender, bs x 4 Skin: scattered scabbed abrasions, abdominal incisions clean/dry/intact Extremities: warm/dry, pulses + 2 R/P, no edema noted  Resolved Hospital Problem list     Assessment & Plan:  Suspected Sepsis with shock due to unknown source Initial interventions/workup included: 3 L of LR & Ceftriaxone/ Metronidazole - Supplemental oxygen as needed, to maintain SpO2 > 90% - f/u cultures, trend lactic/ PCT - Daily CBC,  monitor WBC/ fever curve - IV antibiotics: cefepime  & vancomycin  - Continue peripheral vasopressors to maintain MAP< 65, norepinephrine - Strict I/O's: alert provider if UOP < 0.5 mL/kg/hr  Rhabdomyolysis AGMA Hyperkalemia Acute Kidney Injury in the setting of suspected Sepsis and Rhabdomyolysis Baseline Cr: 0.93, Cr on admission: 7.18 - sodium bicarbonate infusion ordered - f/u VBG, CK, potassium - Strict I/O's: alert provider if UOP < 0.5 mL/kg/hr - consider additional IVF bolus based on CK - Daily BMP, replace electrolytes PRN - Avoid nephrotoxic agents as able, ensure adequate renal perfusion - Consult nephrology if iHD or CRRT indicated   BLE & RUE weakness - f/u MRI - consult neurology PRN  Mild Transaminitis in the setting of suspected sepsis and shock - Trend hepatic function - avoid hepatotoxic agents  Bipolar Disorder Anxiety & Depression PTSD - outpatient prozac, zyprexa and trazodone restarted - doxepin on hold due to renal function - supportive care  Best Practice (right click and "Reselect all SmartList Selections" daily)  Diet/type: NPO w/ oral meds DVT prophylaxis: prophylactic heparin  GI prophylaxis: PPI Lines: N/A Foley:  N/A Code Status:  full code Last date of multidisciplinary goals of care discussion [06/13/21]  Labs   CBC: Recent Labs  Lab 06/13/21 0007  WBC 20.4*  NEUTROABS 18.0*  HGB 11.0*  HCT 33.9*  MCV 87.4  PLT 247    Basic Metabolic Panel: Recent Labs  Lab 06/13/21 0007 06/13/21 0056  NA 132* 133*  K 4.9 5.7*  CL 95* 95*  CO2 18* 18*  GLUCOSE 146* 138*  BUN 82* 89*  CREATININE 7.15* 7.18*  CALCIUM 8.3* 8.3*  MG  --  2.4   GFR: Estimated Creatinine Clearance: 11.2 mL/min (A) (by C-G formula based on SCr of 7.18 mg/dL (H)). Recent Labs  Lab 06/13/21 0007 06/13/21 0009 06/13/21 0132  WBC 20.4*  --   --   LATICACIDVEN  --  2.4* 1.5    Liver Function Tests: Recent Labs  Lab 06/13/21 0007  AST 98*  ALT 56*   ALKPHOS 61  BILITOT 5.8*  PROT 7.5  ALBUMIN 3.6   Recent Labs  Lab 06/13/21 0007  LIPASE 30   No results for input(s): AMMONIA in the last 168 hours.  ABG No results found for: PHART, PCO2ART, PO2ART, HCO3, TCO2, ACIDBASEDEF, O2SAT   Coagulation Profile: No results for input(s): INR, PROTIME in the last 168 hours.  Cardiac Enzymes: No results for input(s): CKTOTAL, CKMB, CKMBINDEX, TROPONINI in the last 168 hours.  HbA1C: Hgb A1c MFr Bld  Date/Time Value Ref Range Status  01/18/2021 03:49 PM 5.3 4.8 - 5.6 % Final    Comment:    (NOTE) Pre diabetes:          5.7%-6.4%  Diabetes:              >6.4%  Glycemic control for   <7.0% adults with diabetes   01/21/2019 10:06 PM 5.7 (H) 4.8 - 5.6 % Final    Comment:    (NOTE) Pre diabetes:          5.7%-6.4% Diabetes:              >6.4% Glycemic control for   <7.0% adults with diabetes     CBG: No results for input(s): GLUCAP in the last 168 hours.  Review of Systems: Positives in BOLD  Gen: Denies fever, chills, weight change, fatigue, night sweats HEENT: Denies blurred vision, double vision, hearing loss, tinnitus, sinus congestion, rhinorrhea, sore throat, neck stiffness, dysphagia PULM: Denies shortness of breath, cough, sputum production, hemoptysis, wheezing CV: Denies chest pain, edema, orthopnea, paroxysmal nocturnal dyspnea, palpitations GI: Denies abdominal pain, nausea, vomiting, diarrhea, hematochezia, melena, constipation, change in bowel habits GU: Denies dysuria, hematuria, polyuria, oliguria, urethral discharge Endocrine: Denies hot or cold intolerance, polyuria, polyphagia or appetite change Derm: Denies rash, dry skin, scaling or peeling skin change Heme: Denies easy bruising, bleeding, bleeding gums Neuro: Denies headache, numbness, weakness, slurred speech, loss of memory or consciousness  Past Medical History:  He,  has a past medical history of Anxiety, Arthritis, Bipolar disorder (HCC),  Depression, GERD (gastroesophageal reflux disease), Hepatitis, History of kidney stones, Hypertension, 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   Surgical History:   Past Surgical History:  Procedure Laterality Date   CYSTOSCOPY WITH URETEROSCOPY AND STENT PLACEMENT     ESOPHAGOGASTRODUODENOSCOPY N/A 01/11/2016   Procedure: ESOPHAGOGASTRODUODENOSCOPY (EGD);  Surgeon: Charlott Rakes, MD;  Location: [facility] ENDOSCOPY;  Service: Endoscopy;  Laterality: N/A;   ESOPHAGOGASTRODUODENOSCOPY N/A 04/09/2020   Procedure: ESOPHAGOGASTRODUODENOSCOPY (EGD);  Surgeon: [name], MD;  Location: [facility] ENDOSCOPY;  Service: Gastroenterology;  Laterality: N/A;   INCISION AND DRAINAGE ABSCESS Left 01/02/2018   Procedure: INCISION AND DRAINAGE LEFT KNEE;  Surgeon: Deeann Saint, MD;  Location: [facility] ORS;  Service: Orthopedics;  Laterality: Left;   JOINT REPLACEMENT Right    TKR   KNEE ARTHROSCOPY Right 06/25/2014   Procedure: ARTHROSCOPY KNEE;  Surgeon: Deeann Saint, MD;  Location: [facility] ORS;  Service: Orthopedics;  Laterality: Right;  partial arthroscopic medial menisectomy   LAPAROSCOPIC APPENDECTOMY N/A 06/02/2021   Procedure: APPENDECTOMY LAPAROSCOPIC;  Surgeon: Campbell Lerner, MD;  Location: [facility] ORS;  Service: General;  Laterality: N/A;   TOTAL KNEE ARTHROPLASTY Right 04/22/2015   Procedure: TOTAL KNEE ARTHROPLASTY;  Surgeon: Deeann Saint, MD;  Location: [facility] ORS;  Service: Orthopedics;  Laterality: Right;   TOTAL KNEE ARTHROPLASTY Left 10/30/2017   Procedure: TOTAL KNEE ARTHROPLASTY;  Surgeon: Deeann Saint, MD;  Location: [facility] ORS;  Service: Orthopedics;  Laterality: Left;   TOTAL KNEE REVISION Left 01/02/2018   Procedure: poly exchange of tibia and patella left knee;  Surgeon: Deeann Saint, MD;  Location: [facility] ORS;  Service: Orthopedics;  Laterality: Left;   UMBILICAL HERNIA  REPAIR  06/02/2021   Procedure: HERNIA REPAIR UMBILICAL ADULT;  Surgeon: Campbell Lerner, MD;  Location: [facility] ORS;  Service: General;;     Social History:   reports that he quit smoking about 37 years ago. His smoking use included cigarettes. He has a 15.00 pack-year smoking history. He has never used smokeless tobacco. He reports current alcohol use. He reports current drug use. Drug: Marijuana.   Family History:  His family history includes CVA in his mother; Depression in his brother.   Allergies No Known Allergies   Home Medications  Prior to Admission medications   Medication Sig Start Date End Date Taking? Authorizing Provider  doxepin (SINEQUAN) 100 MG capsule Take 1 capsule (100 mg total) by mouth at bedtime. 05/12/21  Yes Sarina Ill, DO  enalapril (VASOTEC) 10 MG tablet Take 1 tablet (10 mg total) by mouth daily. 05/28/21 06/27/21 Yes Summers, Rhonda L, PA-C  FLUoxetine (PROZAC) 20 MG capsule Take 3 capsules (60 mg total) by mouth daily. 06/09/21  Yes Lurene Shadow, MD  gabapentin (NEURONTIN) 400 MG capsule Take 1 capsule (400 mg total) by mouth 3 (three) times daily. 05/12/21  Yes Sarina Ill, DO  naltrexone (DEPADE) 50 MG tablet Take 50 mg by mouth daily.   Yes [provider]  naproxen (NAPROSYN) 500 MG tablet Take 1 tablet (500 mg total) by mouth 2 (two) times daily with a meal. 05/27/21 06/26/21 Yes Varney Daily, PA  OLANZapine (ZYPREXA) 10 MG tablet Take 1 tablet (10 mg total) by mouth at bedtime. 06/09/21  Yes Lurene Shadow, MD  OLANZapine (ZYPREXA) 5 MG tablet Take 5 mg by mouth daily.   Yes [provider]  traZODone (DESYREL) 50 MG tablet Take 0.5 tablets (25 mg total) by mouth 3 (three) times daily as needed (Anxiety). 05/12/21  Yes Sarina Ill, DO  pantoprazole (PROTONIX) 40 MG tablet Take 1 tablet (40 mg total) by mouth daily. 01/28/21 05/15/21  Clapacs, Jackquline Denmark, MD     Critical care time: 65 minutes        Betsey Holiday, AGACNP-BC Acute Care Nurse Practitioner [facility]   [phone] / [phone] Please see Amion for pager details.

## 2021-06-13 NOTE — ED Provider Notes (Addendum)
[facility] Provider Note    Event Date/Time   First MD Initiated Contact with Patient 06/12/21 2355     (approximate)   History   Abdominal Pain and Psychiatric Evaluation   HPI  [name] is a 61 y.o. male well-known to the emergency department for multiple visits, usually for psychiatric complaints, but who was admitted a little over 2 weeks ago for cellulitis of the hand and a knee effusion, then developed acute appendicitis while in the hospital and had an appendectomy approximately 9 days ago along with a minor umbilical hernia repair (by Dr. Claudine Mouton).  He was also expressing suicidal ideation and has a history of bipolar disorder.  He was evaluated by psychiatry at the time and first was going to be admitted to the psychiatry unit, but then was reevaluated by a psychiatrist and determined to be appropriate for discharge.  He presents tonight by EMS from the local [facility] at which she is staying.  He called EMS because he has developed severe abdominal pain over the last several days (he is not certain what it started).  He reports that he has also had nausea and vomiting.  He said that it hurts when he moves around and presses on his abdomen.  He also told EMS that he wanted to kill himself.  However he told me that he is just frustrated because he has been hurting so long and he definitively stated in front of me and his nurse that he does not want to kill himself, he just wants to stop hurting.  He reports that he dropped his psychiatric medicine on the floor a few days ago at the motel and was not able to collect it again, so he has not been taking his medicine for the last couple of days.  He denies fever, chest pain, shortness of breath, and dysuria.   Physical Exam   Triage Vital Signs: ED Triage Vitals  Enc Vitals Group     BP 06/12/21 2346 (!) 127/99     Pulse Rate 06/12/21 2346 97     Resp 06/12/21 2346 18     Temp 06/12/21 2346 97.6  F (36.4 C)     Temp src --      SpO2 06/12/21 2346 94 %     Weight 06/12/21 2351 81 kg (178 lb 9.2 oz)     Height 06/12/21 2351 1.753 m ( )     Head Circumference --      Peak Flow --      Pain Score 06/12/21 2351 10     Pain Loc --      Pain Edu? --      Excl. in GC? --     Most recent vital signs: Vitals:   06/13/21 0605 06/13/21 0700  BP:  (!) 106/57  Pulse: (!) 102 (!) 105  Resp: 11 13  Temp: (!) 97.5 F (36.4 C)   SpO2: 100% 99%     General: Awake, no distress.  CV:  Good peripheral perfusion.  Normal heart sounds. Resp:  Normal effort.  Lungs are clear to auscultation. Abd:  Well-appearing surgical scars from laparoscopic appendectomy and umbilical hernia repair.  No abdominal distention but with generalized tenderness to palpation throughout but no rebound or guarding.  No localized peritonitis. Other:  Patient is somewhat argumentative but redirectable.  Initially confirmed that he had expressed suicidal ideation but now denies any desire to kill himself, says he is just frustrated by  the pain.   ED Results / Procedures / Treatments   Labs (all labs ordered are listed, but only abnormal results are displayed) Labs Reviewed  COMPREHENSIVE METABOLIC PANEL - Abnormal; Notable for the following components:      Result Value   Sodium 132 (*)    Chloride 95 (*)    CO2 18 (*)    Glucose, Bld 146 (*)    BUN 82 (*)    Creatinine, Ser 7.15 (*)    Calcium 8.3 (*)    AST 98 (*)    ALT 56 (*)    Total Bilirubin 5.8 (*)    GFR, Estimated 8 (*)    Anion gap 19 (*)    All other components within normal limits  LACTIC ACID, PLASMA - Abnormal; Notable for the following components:   Lactic Acid, Venous 2.4 (*)    All other components within normal limits  CBC WITH DIFFERENTIAL/PLATELET - Abnormal; Notable for the following components:   WBC 20.4 (*)    RBC 3.88 (*)    Hemoglobin 11.0 (*)    HCT 33.9 (*)    RDW 16.5 (*)    Neutro Abs 18.0 (*)    Lymphs Abs 0.6  (*)    Monocytes Absolute 1.7 (*)    Abs Immature Granulocytes 0.10 (*)    All other components within normal limits  ACETAMINOPHEN LEVEL - Abnormal; Notable for the following components:   Acetaminophen (Tylenol), Serum <10 (*)    All other components within normal limits  SALICYLATE LEVEL - Abnormal; Notable for the following components:   Salicylate Lvl <7.0 (*)    All other components within normal limits  BASIC METABOLIC PANEL - Abnormal; Notable for the following components:   Sodium 133 (*)    Potassium 5.7 (*)    Chloride 95 (*)    CO2 18 (*)    Glucose, Bld 138 (*)    BUN 89 (*)    Creatinine, Ser 7.18 (*)    Calcium 8.3 (*)    GFR, Estimated 8 (*)    Anion gap 20 (*)    All other components within normal limits  CBC - Abnormal; Notable for the following components:   WBC 15.0 (*)    RBC 3.51 (*)    Hemoglobin 10.2 (*)    HCT 30.1 (*)    RDW 16.5 (*)    All other components within normal limits  BASIC METABOLIC PANEL - Abnormal; Notable for the following components:   Sodium 134 (*)    CO2 17 (*)    Glucose, Bld 122 (*)    BUN 80 (*)    Creatinine, Ser 6.33 (*)    Calcium 8.1 (*)    GFR, Estimated 10 (*)    Anion gap 16 (*)    All other components within normal limits  PHOSPHORUS - Abnormal; Notable for the following components:   Phosphorus 8.5 (*)    All other components within normal limits  CK - Abnormal; Notable for the following components:   Total CK 3,464 (*)    All other components within normal limits  PHOSPHORUS - Abnormal; Notable for the following components:   Phosphorus 9.4 (*)    All other components within normal limits  BLOOD GAS, VENOUS - Abnormal; Notable for the following components:   pCO2, Ven 36 (*)    pO2, Ven 52 (*)    Bicarbonate 18.5 (*)    Acid-base deficit 6.9 (*)    All other components  within normal limits  CULTURE, BLOOD (ROUTINE X 2)  CULTURE, BLOOD (ROUTINE X 2)  LACTIC ACID, PLASMA  LIPASE, BLOOD  ETHANOL   MAGNESIUM  MAGNESIUM  PROCALCITONIN  URINE DRUG SCREEN, QUALITATIVE ([facility] ONLY)  URINALYSIS, ROUTINE W REFLEX MICROSCOPIC  POTASSIUM  CK     EKG  ED ECG REPORT I, Loleta Rose, the attending physician, personally viewed and interpreted this ECG.  Date: 06/12/2021 EKG Time: 23: 47 Rate: 95 Rhythm: normal sinus rhythm QRS Axis: normal Intervals: normal ST/T Wave abnormalities: Non-specific ST segment / T-wave changes, but no clear evidence of acute ischemia. Narrative Interpretation: no definitive evidence of acute ischemia; does not meet STEMI criteria.    RADIOLOGY See hospital course for details: No acute abnormalities identified on CT head nor cervical spine CT.  Portable chest x-ray, as well as MRIs of the brain and the C-spine, L-spine, and T-spine, are all pending at the time of admission.    PROCEDURES:  Critical Care performed: Yes, see critical care procedure note(s)  .1-3 Lead EKG Interpretation Performed by: Loleta Rose, MD Authorized by: Loleta Rose, MD     Interpretation: normal     ECG rate:  98   ECG rate assessment: normal     Rhythm: sinus rhythm     Ectopy: none     Conduction: normal   .Critical Care Performed by: Loleta Rose, MD Authorized by: Loleta Rose, MD   Critical care provider statement:    Critical care time (minutes):  60   Critical care time was exclusive of:  Separately billable procedures and treating other patients   Critical care was necessary to treat or prevent imminent or life-threatening deterioration of the following conditions:  Shock and sepsis   Critical care was time spent personally by me on the following activities:  Development of treatment plan with patient or surrogate, evaluation of patient's response to treatment, examination of patient, obtaining history from patient or surrogate, ordering and performing treatments and interventions, ordering and review of laboratory studies, ordering and review of  radiographic studies, pulse oximetry, re-evaluation of patient's condition and review of old charts   MEDICATIONS ORDERED IN ED: Medications  iohexol (OMNIPAQUE) 300 MG/ML solution 100 mL (has no administration in time range)  docusate sodium (COLACE) capsule 100 mg (has no administration in time range)  polyethylene glycol (MIRALAX / GLYCOLAX) packet 17 g (has no administration in time range)  heparin injection 5,000 Units (5,000 Units Subcutaneous Given 06/13/21 0606)  pantoprazole (PROTONIX) injection 40 mg (has no administration in time range)  0.9 %  sodium chloride infusion (has no administration in time range)  norepinephrine (LEVOPHED)  in (0.016 mg/mL) premix infusion (5 mcg/min Intravenous Infusion Verify 06/13/21 0700)  sodium bicarbonate 150 mEq in dextrose 5 % 1,150 mL infusion ( Intravenous Infusion Verify 06/13/21 0700)  multivitamin with minerals tablet 1 tablet (has no administration in time range)  thiamine tablet 100 mg (has no administration in time range)  folic acid (FOLVITE) tablet 1 mg (has no administration in time range)  FLUoxetine (PROZAC) capsule 60 mg (has no administration in time range)  OLANZapine (ZYPREXA) tablet 10 mg (has no administration in time range)  OLANZapine (ZYPREXA) tablet 5 mg (has no administration in time range)  traZODone (DESYREL) tablet 25 mg (has no administration in time range)  vancomycin (VANCOREADY) IVPB 2000 mg/400 mL (has no administration in time range)  ceFEPIme (MAXIPIME) 2 g in sodium chloride 0.9 % 100 mL IVPB (has no administration in time  range)  morphine (PF) 4 MG/ML injection 4 mg (4 mg Intravenous Given 06/13/21 0023)  ondansetron (ZOFRAN) injection 4 mg (4 mg Intravenous Given 06/13/21 0023)  cefTRIAXone (ROCEPHIN) 1 g in sodium chloride 0.9 % 100 mL IVPB (0 g Intravenous Stopped 06/13/21 0356)  metroNIDAZOLE (FLAGYL) IVPB 500 mg (0 mg Intravenous Stopped 06/13/21 0356)  lactated ringers bolus 1,000 mL (0 mLs Intravenous  Stopped 06/13/21 0356)    And  lactated ringers bolus 1,000 mL (0 mLs Intravenous Stopped 06/13/21 0356)    And  lactated ringers bolus 500 mL (500 mLs Intravenous New Bag/Given 06/13/21 0357)  thiamine (B-1) injection 100 mg (100 mg Intravenous Given 06/13/21 0216)  sodium zirconium cyclosilicate (LOKELMA) packet 10 g (10 g Oral Given 06/13/21 0515)     IMPRESSION / MDM / ASSESSMENT AND PLAN / ED COURSE  I reviewed the triage vital signs and the nursing notes.                              Differential diagnosis includes, but is not limited to, postoperative pain, SBO/ileus, postoperative infection, abdominal abscess, dehiscence of internal wound, musculoskeletal strain, acute on chronic exacerbation of bipolar disorder, malingering.  Patient's presentation is most consistent with acute presentation with potential threat to life or bodily function.  The patient's social determinants of health are complicating his medical situation, specifically living in a motel and being only intermittently compliant with his psychiatric medication.  I believe that bipolar disorder and possible malingering may also be a complicating factor, but his presentation could represent an acute life-threatening illness given his recent surgery.  Vital signs are stable with borderline tachycardia, afebrile, no hypoxemia, no fever.  Patient seems to be uncomfortable and I am ordering morphine 4 mg IV and Zofran 4 mg IV while we perform a medical evaluation.  Labs ordered include CMP, lipase, CBC with differential, lactic acid, ethanol level, salicylate level, acetaminophen level, and urine drug screen.  Patient denies ongoing suicidal ideation in front of me and his nurse, Dawn.  At this point I do not feel he meets criteria for involuntary commitment nor psychiatric hospitalization.  I will continue to evaluate him from a medical perspective and reassess his psychiatric needs once we have identified or ruled out acute medical  issues.  Interpreted EKG which shows no evidence of ischemia. Clinical Course as of 07/28/21 0958  Sun Jun 13, 2021  0017 CBC with Differential(!) CBC notable for leukocytosis of 20.4, up from about 9 ten days ago.  Unclear clinical significance given the recent surgery but concerning for possible postoperative infection. [CF]  0049 Comprehensive metabolic panel(!) Comprehensive metabolic panel is so abnormal unchanged from prior that I am going to send a redraw basic metabolic panel.  His creatinine is 7.15 and his BUN is 82, consistent with acute renal failure.  Anion gap is 19.  I ordered LR 1 L IV bolus and a repeat basic metabolic panel and discussed the plan with the nurse. [CF]  0051 Ethanol Negative [CF]  0051 Lipase: 30 Normal lipase [CF]  0055 Lactic Acid, Venous(!!): 2.4 Elevated lactic acid, possibly sepsis, could also be secondary to his acute renal failure. [CF]  0103 Given concern for acute infectious process, I will give a dose of ceftriaxone 1 g IV and metronidazole 500 mg IV. [CF]  0105 Given his acute renal failure, leukocytosis of greater than 20, heart rate of greater than 90, and concern for intra-abdominal  infection, in addition to the empiric antibiotics, I am activating code sepsis and ordering LR 30 mL/kg (2.5 L total). [CF]  0120 Basic metabolic panel(!) Repeat basic metabolic panel shows persistent creatinine of 7, now also showing a potassium of 5.7, unclear whether the original potassium or this potassium is accurate.  Continuing with fluid hydration.  Patient's anion gap is 20, unclear if this could be an element of alcoholic ketoacidosis although he does not seem to be having withdrawal symptoms and alcohol is not been a recent issue.  This is likely secondary to his lactic acidosis. [CF]  0136 The patient's blood pressure has dropped substantially but LR boluses are running.  He reportedly told one of the nurses that he has recently used alcohol and crack.  Given  his anion gap and history of alcohol abuse, I will start D5 normal saline at 100 mL/h.  I am also giving him thiamine 100 mg IV. [CF]  0230 Lactic Acid, Venous: 1.5 Second lactic acid is normal which is reassuring [CF]  0230 CT ABDOMEN PELVIS WO CONTRAST I viewed and interpreted the patient's CT scan of the abdomen pelvis.  I see no clear postoperative infection or evidence of SBO/ileus.  Also see no evidence of bladder outlet obstruction that might explain the kidney issue.  The radiologist commented on evidence of colitis and some stable renal findings but no clear acute surgical issue. [CF]  0235 Patient awake and alert, in no distress.  Blood pressure is improved slightly to 86/56, MAP is just above 65.  Fluids are infusing now.  Given his constellation of issues, I am consulting Cheryll Cockayne Rust-Chester, ICU NP, to discuss admission to the ICU service.  I updated the patient and he agrees with the plan for admission. [CF]  0250 Discussed case with Cheryll Cockayne Rust-Chester, ICU NP, by phone.  She will consult on the patient in the emergency department.  The patient seems borderline right now between needing ICU care versus stepdown under the hospitalist service.  She will come evaluate the patient and offer recommendations. [CF]  0251 Ordering Lokelma 10 g p.o. to stabilize and hopefully excrete some of the potassium while the patient is being rehydrated.  I considered emergent consultation with nephrology, but it is very unlikely the nephrologist would want to emergently intervene overnight as opposed to consulting in the morning to see if his creatinine has responded to fluid hydration. [CF]  0304 Completed sepsis reassesment. [CF]  0323 Britton-Lee from the ICU evaluated the patient in person.  She told me that his sister is very different for her.  He claims to have fallen and now has head and neck pain and has trouble raising his arms and his legs and claims he cannot feel his legs.  When she  provided a painful stimulus to his legs, he did not react and claimed he could not feel it, and he would not raise up his legs against gravity.  I went back in the room with her.  I reminded him that he told me an entirely different story, and that in fact he had told me that he was playing basketball earlier today but stopped playing basketball because of the pain in his abdomen.  He says that he does not remember saying that.  I then provided a painful stimulus to the big toes on both of his feet and each time he yelled "ouch!"  And jerked his leg up and away.  He was then able to push against  my hand and raise up against gravity.  He also has what appears to be normal grip strength although he has diminished effort when moving his arms and legs.  He also claims to be tender to palpation all throughout the neck.  Given the highly variable history, history of personality disorder and other psychiatric illnesses, and difficulty identifying a particular source of infection as well as any possible source of injury, I am obtaining a CT scan of the head and neck to look for any evidence that he has had a traumatic injury.  I will consider MRIs if necessary, but at this time I cannot appreciate any focal neurological deficits. [CF]  0325 Given that the patient's blood pressure is improving, Britton-Lee suggested that we continue with fluid resuscitation and recheck his blood pressure after the CT scans.  She will admit if he meets ICU criteria, but at this time he may be appropriate for stepdown on the hospitalist service. [CF]  0401 I reviewed and interpreted the patient's head CT and cervical spine CT.  I see no evidence of acute intracranial injury nor cervical spine fracture.  The radiologist report also identifies no acute traumatic injury.  The patient is currently fast asleep.  He has received 2 L of LR.  His blood pressure is now 83/56, which gives a MAP of 61. [CF]  0402 Contacted Cheryll Cockayne  Rust-Chester, ICU NP.  She is occupied with a critical patient right now, but if his blood pressure is still low in 30 minutes she will put in admission orders. [CF]  0435 Patient's blood pressure remains low with a MAP of 61.  He is still sleeping.  He continues to report neurological deficits but not exhibit neurological deficits on exam.  I discussed the case again with the ICU provider and she will put an admission orders including peripheral pressors.  She will also follow-up with nephrology. [CF]  [phone] I discussed the possibility of MRI with Cheryll Cockayne Rust-Chester, ICU NP.  An ideal study to rule out issues such as spinal epidural abscess would include contrast, but given his acute renal failure, that is not possible at this time.  However, a noncontrast study to further evaluate and hopefully eliminate the possibility of acute intracranial issue such as CVA and spinal cord impingement or infection should reveal some type of abnormality if it is present even if it is not the ideal protocol.  The potential risk of a missed infection or spinal cord impingement is worth the try.  As per my discussion with the ICU provider, I am ordering MR brain without contrast, C-spine without contrast, thoracic spine without contrast, and lumbar spine without contrast. [CF]    Clinical Course User Index [CF] Loleta Rose, MD     FINAL CLINICAL IMPRESSION(S) / ED DIAGNOSES   Final diagnoses:  Acute renal failure, unspecified acute renal failure type (HCC)  Hyperkalemia  Septic shock (HCC)     Rx / DC Orders   ED Discharge Orders     None        Note:  This document was prepared using Dragon voice recognition software and may include unintentional dictation errors.   Loleta Rose, MD 06/13/21 9528    Loleta Rose, MD 07/28/21 [phone]

## 2021-06-13 NOTE — ED Notes (Signed)
BP 83/54 verbal order to give full L of LR vs 500 mL. Fluid bolus started at this time

## 2021-06-13 NOTE — ED Notes (Signed)
Patient told Dr Karma Greaser that he really does not want to harm himself he said that just because he as having som much pain in his abdomen.

## 2021-06-13 NOTE — Consult Note (Signed)
Pharmacy Antibiotic Note  [name] is a 61 y.o. male admitted on 06/12/2021 with sepsis.  Pharmacy has been consulted for cefepime and vancomycin dosing. 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   Plan: Will start Cefepime 2 g q24H   Will loading the patient with vancomycin 2000 mg x 1. Will order a 24 hours random level as renal function is not stable. Dose by levels until renal function stabilized.     Height: 5\' 9"  (175.3 cm) Weight: 88.3 kg (194 lb 10.7 oz) IBW/kg (Calculated) : 70.7  Temp (24hrs), Avg:97.5 F (36.4 C), Min:97.4 F (36.3 C), Max:97.6 F (36.4 C)  Recent Labs  Lab 06/13/21 0007 06/13/21 0009 06/13/21 0056 06/13/21 0132  WBC 20.4*  --   --   --   CREATININE 7.15*  --  7.18*  --   LATICACIDVEN  --  2.4*  --  1.5    Estimated Creatinine Clearance: 12.3 mL/min (A) (by C-G formula based on SCr of 7.18 mg/dL (H)).    No Known Allergies  Antimicrobials this admission: 6/4 cefepime >>  6/4 vancomycin >>   Dose adjustments this admission: None  Microbiology results: 6/4 BCx: pending   Thank you for allowing pharmacy to be a part of this patient's care.  8/4, PharmD, BCPS 06/13/2021 6:50 AM

## 2021-06-13 NOTE — Consult Note (Signed)
PHARMACY CONSULT NOTE  Pharmacy Consult for Electrolyte Monitoring and Replacement   Recent Labs: Potassium (mmol/L)  Date Value  06/13/2021 4.1   Magnesium (mg/dL)  Date Value  91/69/4503 2.1   Calcium (mg/dL)  Date Value  88/82/8003 8.1 (L)   Albumin (g/dL)  Date Value  49/17/9150 3.6   Phosphorus (mg/dL)  Date Value  56/97/9480 8.5 (H)   Sodium (mmol/L)  Date Value  06/13/2021 134 (L)   Assessment: Patient is a 60 y/o M with medical history including anxiety and depression, bipolar disorder, arthritis, hepatitis, HTN, pericarditis, PTSD who is admitted with septic shock due to unknown source and rhabdomyolysis. Pharmacy consulted to assist with electrolyte monitoring and replacement as indicated.  Significant AKI on admission (Scr 7.15 >> 6.33)  MIVF: Isotonic bicarbonate gtt at 125 cc/hr  Goal of Therapy:  Electrolytes within normal limits  Plan:  --No electrolyte replacement indicated at this time --Follow-up electrolytes with AM lab tomorrow  Tressie Ellis 06/13/2021 7:38 AM

## 2021-06-14 DIAGNOSIS — R6521 Severe sepsis with septic shock: Secondary | ICD-10-CM | POA: Diagnosis not present

## 2021-06-14 DIAGNOSIS — A419 Sepsis, unspecified organism: Secondary | ICD-10-CM

## 2021-06-14 LAB — BASIC METABOLIC PANEL
Anion gap: 12 (ref 5–15)
BUN: 76 mg/dL — ABNORMAL HIGH (ref 6–20)
CO2: 24 mmol/L (ref 22–32)
Calcium: 8.1 mg/dL — ABNORMAL LOW (ref 8.9–10.3)
Chloride: 102 mmol/L (ref 98–111)
Creatinine, Ser: 5.3 mg/dL — ABNORMAL HIGH (ref 0.61–1.24)
GFR, Estimated: 12 mL/min — ABNORMAL LOW (ref >60)
Glucose, Bld: 97 mg/dL (ref 70–99)
Potassium: 4.2 mmol/L (ref 3.5–5.1)
Sodium: 138 mmol/L (ref 135–145)

## 2021-06-14 LAB — PROCALCITONIN: Procalcitonin: 2.52 ng/mL

## 2021-06-14 LAB — CK: Total CK: 606 U/L — ABNORMAL HIGH (ref 49–397)

## 2021-06-14 LAB — VANCOMYCIN, RANDOM: Vancomycin Rm: 21 ug/mL

## 2021-06-14 LAB — GLUCOSE, CAPILLARY
Glucose-Capillary: 100 mg/dL — ABNORMAL HIGH (ref 70–99)
Glucose-Capillary: 108 mg/dL — ABNORMAL HIGH (ref 70–99)
Glucose-Capillary: 110 mg/dL — ABNORMAL HIGH (ref 70–99)
Glucose-Capillary: 141 mg/dL — ABNORMAL HIGH (ref 70–99)
Glucose-Capillary: 83 mg/dL (ref 70–99)
Glucose-Capillary: 98 mg/dL (ref 70–99)

## 2021-06-14 LAB — CBC
HCT: 30.2 % — ABNORMAL LOW (ref 39.0–52.0)
Hemoglobin: 10 g/dL — ABNORMAL LOW (ref 13.0–17.0)
MCH: 28.2 pg (ref 26.0–34.0)
MCHC: 33.1 g/dL (ref 30.0–36.0)
MCV: 85.1 fL (ref 80.0–100.0)
Platelets: 244 10*3/uL (ref 150–400)
RBC: 3.55 MIL/uL — ABNORMAL LOW (ref 4.22–5.81)
RDW: 16.2 % — ABNORMAL HIGH (ref 11.5–15.5)
WBC: 9.4 10*3/uL (ref 4.0–10.5)
nRBC: 0 % (ref 0.0–0.2)

## 2021-06-14 LAB — MAGNESIUM: Magnesium: 2.1 mg/dL (ref 1.7–2.4)

## 2021-06-14 LAB — PHOSPHORUS: Phosphorus: 5.3 mg/dL — ABNORMAL HIGH (ref 2.5–4.6)

## 2021-06-14 MED ORDER — PANTOPRAZOLE SODIUM 40 MG PO TBEC
40.0000 mg | DELAYED_RELEASE_TABLET | Freq: Every day | ORAL | Status: DC
  Administered 2021-06-14 – 2021-06-17 (×4): 40 mg via ORAL
  Filled 2021-06-14 (×5): qty 1

## 2021-06-14 MED ORDER — LACTATED RINGERS IV SOLN: INTRAVENOUS | Status: DC

## 2021-06-14 MED ORDER — SODIUM CHLORIDE 0.9 % IV SOLN: INTRAVENOUS | Status: DC

## 2021-06-14 MED ORDER — TAMSULOSIN HCL 0.4 MG PO CAPS
0.4000 mg | ORAL_CAPSULE | Freq: Every day | ORAL | Status: DC
  Administered 2021-06-14 – 2021-06-18 (×5): 0.4 mg via ORAL
  Filled 2021-06-14 (×5): qty 1

## 2021-06-14 MED ORDER — DOXEPIN HCL 100 MG PO CAPS
100.0000 mg | ORAL_CAPSULE | Freq: Every day | ORAL | Status: DC
  Administered 2021-06-14 – 2021-06-17 (×4): 100 mg via ORAL
  Filled 2021-06-14 (×5): qty 1

## 2021-06-14 NOTE — Consult Note (Signed)
PHARMACY CONSULT NOTE  Pharmacy Consult for Electrolyte Monitoring and Replacement   Recent Labs: Potassium (mmol/L)  Date Value  06/14/2021 4.2   Magnesium (mg/dL)  Date Value  81/15/7262 2.1   Calcium (mg/dL)  Date Value  03/55/9741 8.1 (L)   Albumin (g/dL)  Date Value  63/84/5364 3.6   Phosphorus (mg/dL)  Date Value  68/03/2120 5.3 (H)   Sodium (mmol/L)  Date Value  06/14/2021 138   Assessment: Patient is a 61 y/o M with medical history including anxiety and depression, bipolar disorder, arthritis, hepatitis, HTN, pericarditis, PTSD who is admitted with septic shock due to unknown source and rhabdomyolysis. Pharmacy consulted to assist with electrolyte monitoring and replacement as indicated.  MIVF: lactated ringers at 125 cc/hr  Goal of Therapy:  Electrolytes within normal limits  Plan:  --No electrolyte replacement indicated at this time --Follow-up electrolytes with AM lab tomorrow  Lowella Bandy 06/14/2021 6:46 AM

## 2021-06-14 NOTE — Progress Notes (Signed)
Speech Language Pathology Treatment:    [name] MRN: [id number] DOB: 10-18-1960 Today's Date: 06/14/2021 Time: [id number] SLP Time Calculation (min) (ACUTE ONLY): 10 min  Assessment / Plan / Recommendation Clinical Impression  Pt seen for diet tolerance. Pt alert, pleasant, and cooperative. On room air. Consuming breakfast upon SLP entrance to room. Pt denies difficulty swallowing with mech soft consistency and thin liquids. Cleared with RN and RN noted pt without observed or reported difficulty consuming current diet or taking medication.   Per chart review, temp and WBC WNL. No updated chest imaging since initial evaluation.   Pt observed with items from mech soft diet and thin liquids meal tray including chopped pancakes and sausage with syrup, coffee (via cup), and water (via straw). Pt demonstrated a functional oral swallow across all consistencies. No overt or subtle s/sx pharyngeal dysphagia. No changes to vital signs or vocal quality appreciated. Pt stated that mech soft consistency is "just right" given missing teeth and broken denture plate.   Recommend continuation of a mech soft diet with thin liquids with safe swallowing strategies/aspiration precautions as outlined below.   SLP to sign off as pt has no acute SLP needs at this time.   Pt and RN made aware of results, recommendations, and SLP POC. Pt verbalized understanding/agreement.   HPI HPI: Per H&P "61 yo M presenting to [facility] ED with multiple complaints, from a local motel [place] he is staying.  Per ED documentation, patient called EMS because of severe abdominal pain over the last several days as well as nausea and vomiting.  He reported it hurts when he moves around and presses on his abdomen. He also reported to EMS that wanted to kill himself. Also per ED documentation he reported dropping his psychiatric medicine on the floor a few days ago at the motel and was not able to collect it again so  he has not been taking his medicine for the last couple of days.  Patient initially told the EDP and nursing that he had fallen and had abdominal pain while playing basketball.  Of note the patient was admitted little over 2 weeks ago for cellulitis of the hand and a knee effusion, subsequently developing acute appendicitis undergoing an appendectomy approximately 9 days ago with a minor umbilical hernia repair by Dr. Christian [name].  He was also expressing suicidal ideation and has a history of bipolar disorder.  During this hospitalization he was evaluated by psychiatry and was initially going to be admitted to the psychiatry unit but then reevaluated and determined to be appropriate for discharge.     Upon bedside assessment patient complaining of aching severe pain in bilateral hips, making it difficult to lift his legs off the bed or walk.  He reports having to   "walk a lot lately" because he is homeless.  He also stated his dog has been staying at the motel and has been urinating on the floor which he slipped in fell.  However, earlier he also stated he fell in the bathroom and hit his head on the tub.  He is complaining of feeling like he has " whiplash".  And that his right arm neck and head are also hurting.  He denies fever/chills, chest pain, shortness of breath and dysuria.  He does admit to poor p.o. intake recently.  He denies tobacco use, regular alcohol use/recreational drug use.  He reported buying a bottle of vodka on 06/11/2021 but giving it away without having consumed any.Marland KitchenMarland Kitchen  CXR 06/13/21: No acute cardiopulmonary process  CT head without contrast 06/13/2021: Negative for acute intracranial abnormality  CT cervical spine without contrast 06/13/2021: Mild reversal of cervical lordosis with degenerative change.  No acute osseous abnormality"      SLP Plan  All goals met      Recommendations for follow up therapy are one component of a multi-disciplinary discharge planning process, led by the  attending physician.  Recommendations may be updated based on patient status, additional functional criteria and insurance authorization.    Recommendations  Diet recommendations: Dysphagia 3 (mechanical soft);Thin liquid Medication Administration: Whole meds with liquid Supervision: Patient able to self feed (set up assistance) Compensations: Minimize environmental distractions;Slow rate;Small sips/bites Postural Changes and/or Swallow Maneuvers: Out of bed for meals;Seated upright 90 degrees;Upright 30-60 min after meal                Oral Care Recommendations: Oral care BID;Patient independent with oral care (set up) Follow Up Recommendations: No SLP follow up (no f/u needs for swallowing) Assistance recommended at discharge: Set up Supervision/Assistance SLP Visit Diagnosis: Dysphagia, oral phase (R13.11) Plan: All goals met          [name], M.S., [facility] [phone] [name])   [name]  06/14/2021, 9:50 AM

## 2021-06-14 NOTE — Plan of Care (Signed)
  Problem: SLP Dysphagia Goals Goal: Misc Dysphagia Goal Description: Pt will tolerate least restrictive diet with adequate oral efficiency and no overt s/sx pharyngeal dysphagia or changes to lung status concerning for aspiration/aspiration PNA. Outcome: Completed/Met

## 2021-06-14 NOTE — Progress Notes (Signed)
PROGRESS NOTE  [name] OMB:[id number] DOB: [birth year] DOA: 06/12/2021 PCP: [name], PA  Brief History   61 yo M presenting to [facility] ED with multiple complaints, from a local motel where he is staying.  Per ED documentation, patient called EMS because of severe abdominal pain over the last several days as well as nausea and vomiting.  He reported it hurts when he moves around and presses on his abdomen. He also reported to EMS that wanted to kill himself. Also per ED documentation he reported dropping his psychiatric medicine on the floor a few days ago at the motel and was not able to collect it again so he has not been taking his medicine for the last couple of days.  Patient initially told the EDP and nursing that he had fallen and had abdominal pain while playing basketball. Of note the patient was admitted little over 2 weeks ago for cellulitis of the hand and a knee effusion, subsequently developing acute appendicitis undergoing an appendectomy approximately 9 days ago with a minor umbilical hernia repair by Dr. Claudine Mouton.  He was also expressing suicidal ideation and has a history of bipolar disorder.  During this hospitalization he was evaluated by psychiatry and was initially going to be admitted to the psychiatry unit but then reevaluated and determined to be appropriate for discharge.   Upon bedside assessment patient complaining of aching severe pain in bilateral hips, making it difficult to lift his legs off the bed or walk.  He reports having to   "walk a lot lately" because he is homeless.  He also stated his dog has been staying at the motel and has been urinating on the floor which he slipped in fell.  However, earlier he also stated he fell in the bathroom and hit his head on the tub.  He is complaining of feeling like he has " whiplash".  And that his right arm neck and head are also hurting.  He denies fever/chills, chest pain, shortness of breath and dysuria.  He does  admit to poor p.o. intake recently.  He denies tobacco use, regular alcohol use/recreational drug use.  He reported buying a bottle of vodka on 06/11/2021 but giving it away without having consumed any.   ED course: Upon arrival he told EDP that he was frustrated because he has been hurting so long and that he definitely does not want to kill himself, just wants to stop hurting.  Due to concern for postoperative complication CT abdomen pelvis performed showing moderate severity colitis and mild to moderate severity bibasilar atelectasis with a distended gallbladder without evidence of cholelithiasis.  Lab work revealed acute renal failure, AGMA, mild hyperkalemia, leukocytosis and lactic acidosis.  Sepsis protocol initiated.  While in ED after IV fluid resuscitation patient became hypotensive requiring peripheral vasopressor support.  Upon ICU assessment, patient now with new complaints of leg weakness and neck pain with possible fall described.  EDP ordered CT head and CT cervical spine which were unrevealing.  After further discussion MRI ordered for additional work-up of complaints. medications given: Zofran IV, 4 mg morphine IV, 3 L LR bolus, Lokelma, thiamine Initial Vitals: 97.6, 18, 97, 127/99 and SPO2 94% on room air, however within 2 hours of arrival patient hypotensive and tachycardic Significant labs: (Labs/ Imaging personally reviewed) I, Cheryll Cockayne Rust-Chester, AGACNP-BC, personally viewed and interpreted this ECG. EKG Interpretation: Date: 06/12/21, EKG Time: 23:47, Rate: 95, Rhythm: NSR, QRS Axis:  normal, Intervals: normal, ST/T Wave abnormalities: none, Narrative  Interpretation: NSR Chemistry: Na+:133, K+: 5.7, BUN/Cr.: 89/7.18, Serum CO2/ AG: 18/20, AST/ALT: 98/ 56, Mg: 2.4 Hematology: WBC: 20.4, Hgb: 11.0, plt: 247 CK: 3,464, Lactic/ PCT: 2.4 > 1.5 VBG: pending   CXR 06/13/21: No acute cardiopulmonary process CT head without contrast 06/13/2021: Negative for acute intracranial  abnormality CT cervical spine without contrast 06/13/2021: Mild reversal of cervical lordosis with degenerative change.  No acute osseous abnormality  Overnight the patient has stabilized.  He will be transferred to telemetry bed. Foley catheter has been placed for urinary retention. Flomax has been started.   Consultants  PCCM  Procedures  None  Antibiotics   Anti-infectives (From admission, onward)    Start     Dose/Rate Route Frequency Ordered Stop   06/14/21 1200  cefTRIAXone (ROCEPHIN) 2 g in sodium chloride 0.9 % 100 mL IVPB        2 g 200 mL/hr over 30 Minutes Intravenous Every 24 hours 06/13/21 1139     06/13/21 0800  vancomycin (VANCOREADY) IVPB 2000 mg/400 mL        2,000 mg 200 mL/hr over 120 Minutes Intravenous  Once 06/13/21 0655 06/13/21 1023   06/13/21 0800  ceFEPIme (MAXIPIME) 2 g in sodium chloride 0.9 % 100 mL IVPB  Status:  Discontinued        2 g 200 mL/hr over 30 Minutes Intravenous Every 24 hours 06/13/21 0655 06/13/21 1138   06/13/21 0115  cefTRIAXone (ROCEPHIN) 1 g in sodium chloride 0.9 % 100 mL IVPB        1 g 200 mL/hr over 30 Minutes Intravenous STAT 06/13/21 0103 06/13/21 0356   06/13/21 0115  metroNIDAZOLE (FLAGYL) IVPB 500 mg        500 mg 100 mL/hr over 60 Minutes Intravenous  Once 06/13/21 0103 06/13/21 0356       Subjective  The patient is resting comfortably. No new complaints.  Objective   Vitals:  Vitals:   06/14/21 1500 06/14/21 1520  BP: 124/71 109/72  Pulse: 82 76  Resp: 15 16  Temp:  97.8 F (36.6 C)  SpO2: 94% 95%    Exam:  Constitutional:  The patient is awake, alert, and oriented x 3. No acute distress. Respiratory:  No increased work of breathing. No wheezes, rales, or rhonchi No tactile fremitus Cardiovascular:  Regular rate and rhythm No murmurs, ectopy, or gallups. No lateral PMI. No thrills. Abdomen:  Abdomen is soft, non-tender, non-distended No hernias, masses, or organomegaly Normoactive bowel sounds.   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 MRI L Spine: Degenerative arthritis vs evolving infection - short interval follow-up study is recommended to clarify; mild degenerative disc disease at L4-5 and L5-S1 with mild bilateral subarticular stenosis at L4-5; 1.9 cm lesion at the posterior right kidney seen again. Recommendation from 06/02/2021 was for follow up renal ultrasound in 6 months. CT abdomen and pelvis: Moderate severity colitis involving ascending and  proximal transverse.  Cardiology Data  EKG  Other Data    Scheduled Meds:  Chlorhexidine Gluconate Cloth  6 each Topical Daily   doxepin  100 mg Oral QHS   FLUoxetine  60 mg Oral Daily   folic acid  1 mg Oral Daily   heparin  5,000 Units Subcutaneous Q8H   midodrine  5 mg Oral TID WC   multivitamin with minerals  1 tablet Oral Daily   OLANZapine  10 mg Oral QHS   OLANZapine  5 mg Oral Daily   pantoprazole  40 mg Oral QHS   tamsulosin  0.4 mg Oral QPC supper   thiamine  100 mg Oral Daily   Continuous Infusions:  sodium chloride Stopped (06/14/21 1439)   cefTRIAXone (ROCEPHIN)  IV Stopped (06/14/21 1333)   lactated ringers Stopped (06/14/21 1439)    Principal Problem:   Septic shock (HCC) Active Problems:   Acute renal failure (HCC)   Non-traumatic rhabdomyolysis   LOS: 1 day   A & P  Suspected Sepsis with shock due to  unknown source Initial interventions/workup included: 3 L of LR & Ceftriaxone/ Metronidazole - Supplemental oxygen as needed, to maintain SpO2 > 90% - f/u cultures, trend lactic/ PCT - Daily CBC, monitor WBC/ fever curve - IV antibiotics: cefepime  & vancomycin  - Continue peripheral vasopressors to maintain MAP< 65, norepinephrine - Strict I/O's: alert provider if UOP < 0.5 mL/kg/hr -Resolving  Colitis: Likely source of sepsis. Continue IV antibiotics. Monitor for improvement.   Rhabdomyolysis AGMA Hyperkalemia Acute Kidney Injury in the setting of suspected Sepsis and Rhabdomyolysis Baseline Cr: 0.93, Cr on admission: 7.18 - sodium bicarbonate infusion ordered - f/u VBG, CK, potassium - Strict I/O's: alert provider if UOP < 0.5 mL/kg/hr - consider additional IVF bolus based on CK - Daily BMP, replace electrolytes PRN - Avoid nephrotoxic agents as able, ensure adequate renal perfusion - Consult nephrology if iHD or CRRT indicated    BLE & RUE weakness - f/u MRI - consult neurology PRN   Mild Transaminitis in the setting of suspected sepsis and shock - Trend hepatic function - avoid hepatotoxic agents  Polysubstance abuse:  Cocaine and Opiate positive   Bipolar Disorder Anxiety & Depression PTSD - outpatient prozac, zyprexa and trazodone restarted - doxepin on hold due to renal function - supportive care  I have seen and examined this patient myself. I have spent 38 minutes in his evaluation and care.  DVT prophylaxis: Heparin  Code Status: Full Code Family Communication: None available Disposition Plan: tbd    Katya Rolston, DO [facility] Direct contact: see [email or web address]  7PM-7AM contact night coverage as above 06/14/2021, 5:25 PM  LOS: 1 day

## 2021-06-14 NOTE — Plan of Care (Signed)

## 2021-06-14 NOTE — Progress Notes (Signed)
Report called to [employer] receiving RN, Casimiro Needle around 3 pm. Transported via bed to room 214 by NT.

## 2021-06-15 DIAGNOSIS — R6521 Severe sepsis with septic shock: Secondary | ICD-10-CM | POA: Diagnosis not present

## 2021-06-15 DIAGNOSIS — A419 Sepsis, unspecified organism: Secondary | ICD-10-CM | POA: Diagnosis not present

## 2021-06-15 LAB — GLUCOSE, CAPILLARY
Glucose-Capillary: 110 mg/dL — ABNORMAL HIGH (ref 70–99)
Glucose-Capillary: 88 mg/dL (ref 70–99)
Glucose-Capillary: 95 mg/dL (ref 70–99)
Glucose-Capillary: 114 mg/dL — ABNORMAL HIGH (ref 70–99)
Glucose-Capillary: 93 mg/dL (ref 70–99)

## 2021-06-15 LAB — BASIC METABOLIC PANEL
Anion gap: 7 (ref 5–15)
BUN: 63 mg/dL — ABNORMAL HIGH (ref 6–20)
CO2: 24 mmol/L (ref 22–32)
Calcium: 8.8 mg/dL — ABNORMAL LOW (ref 8.9–10.3)
Chloride: 109 mmol/L (ref 98–111)
Creatinine, Ser: 4.15 mg/dL — ABNORMAL HIGH (ref 0.61–1.24)
GFR, Estimated: 16 mL/min — ABNORMAL LOW (ref >60)
Glucose, Bld: 99 mg/dL (ref 70–99)
Potassium: 4.5 mmol/L (ref 3.5–5.1)
Sodium: 140 mmol/L (ref 135–145)

## 2021-06-15 LAB — MAGNESIUM: Magnesium: 2 mg/dL (ref 1.7–2.4)

## 2021-06-15 LAB — MYOGLOBIN, URINE: Myoglobin, Ur: 4 ng/mL (ref 0–13)

## 2021-06-15 LAB — PROCALCITONIN: Procalcitonin: 0.65 ng/mL

## 2021-06-15 NOTE — Progress Notes (Signed)
PROGRESS NOTE  [name] OMB:[id number] DOB: [birth year] DOA: 06/12/2021 PCP: [name], PA  Brief History   61 yo M presenting to [facility] ED with multiple complaints, from a local motel where he is staying.  Per ED documentation, patient called EMS because of severe abdominal pain over the last several days as well as nausea and vomiting.  He reported it hurts when he moves around and presses on his abdomen. He also reported to EMS that wanted to kill himself. Also per ED documentation he reported dropping his psychiatric medicine on the floor a few days ago at the motel and was not able to collect it again so he has not been taking his medicine for the last couple of days.  Patient initially told the EDP and nursing that he had fallen and had abdominal pain while playing basketball. Of note the patient was admitted little over 2 weeks ago for cellulitis of the hand and a knee effusion, subsequently developing acute appendicitis undergoing an appendectomy approximately 9 days ago with a minor umbilical hernia repair by Dr. Claudine Mouton.  He was also expressing suicidal ideation and has a history of bipolar disorder.  During this hospitalization he was evaluated by psychiatry and was initially going to be admitted to the psychiatry unit but then reevaluated and determined to be appropriate for discharge.   Upon bedside assessment patient complaining of aching severe pain in bilateral hips, making it difficult to lift his legs off the bed or walk.  He reports having to   "walk a lot lately" because he is homeless.  He also stated his dog has been staying at the motel and has been urinating on the floor which he slipped in fell.  However, earlier he also stated he fell in the bathroom and hit his head on the tub.  He is complaining of feeling like he has " whiplash".  And that his right arm neck and head are also hurting.  He denies fever/chills, chest pain, shortness of breath and dysuria.  He does  admit to poor p.o. intake recently.  He denies tobacco use, regular alcohol use/recreational drug use.  He reported buying a bottle of vodka on 06/11/2021 but giving it away without having consumed any.   ED course: Upon arrival he told EDP that he was frustrated because he has been hurting so long and that he definitely does not want to kill himself, just wants to stop hurting.  Due to concern for postoperative complication CT abdomen pelvis performed showing moderate severity colitis and mild to moderate severity bibasilar atelectasis with a distended gallbladder without evidence of cholelithiasis.  Lab work revealed acute renal failure, AGMA, mild hyperkalemia, leukocytosis and lactic acidosis.  Sepsis protocol initiated.  While in ED after IV fluid resuscitation patient became hypotensive requiring peripheral vasopressor support.  Upon ICU assessment, patient now with new complaints of leg weakness and neck pain with possible fall described.  EDP ordered CT head and CT cervical spine which were unrevealing.  After further discussion MRI ordered for additional work-up of complaints. medications given: Zofran IV, 4 mg morphine IV, 3 L LR bolus, Lokelma, thiamine Initial Vitals: 97.6, 18, 97, 127/99 and SPO2 94% on room air, however within 2 hours of arrival patient hypotensive and tachycardic Significant labs: (Labs/ Imaging personally reviewed) I, Cheryll Cockayne Rust-Chester, AGACNP-BC, personally viewed and interpreted this ECG. EKG Interpretation: Date: 06/12/21, EKG Time: 23:47, Rate: 95, Rhythm: NSR, QRS Axis:  normal, Intervals: normal, ST/T Wave abnormalities: none, Narrative  Interpretation: NSR Chemistry: Na+:133, K+: 5.7, BUN/Cr.: 89/7.18, Serum CO2/ AG: 18/20, AST/ALT: 98/ 56, Mg: 2.4 Hematology: WBC: 20.4, Hgb: 11.0, plt: 247 CK: 3,464, Lactic/ PCT: 2.4 > 1.5 VBG: pending   CXR 06/13/21: No acute cardiopulmonary process CT head without contrast 06/13/2021: Negative for acute intracranial  abnormality CT cervical spine without contrast 06/13/2021: Mild reversal of cervical lordosis with degenerative change.  No acute osseous abnormality  Overnight the patient has stabilized.  He will be transferred to telemetry bed. Foley catheter has been placed for urinary retention. Flomax has been started.  Despite IV fluids the patient's creatinine remains elevated at 4.15 this morning. Renal ultrasound has been ordered and nephrology has been consulted.  Consultants  PCCM Nephrology  Procedures  None  Antibiotics   Anti-infectives (From admission, onward)    Start     Dose/Rate Route Frequency Ordered Stop   06/14/21 1200  cefTRIAXone (ROCEPHIN) 2 g in sodium chloride 0.9 % 100 mL IVPB        2 g 200 mL/hr over 30 Minutes Intravenous Every 24 hours 06/13/21 1139     06/13/21 0800  vancomycin (VANCOREADY) IVPB 2000 mg/400 mL        2,000 mg 200 mL/hr over 120 Minutes Intravenous  Once 06/13/21 0655 06/13/21 1023   06/13/21 0800  ceFEPIme (MAXIPIME) 2 g in sodium chloride 0.9 % 100 mL IVPB  Status:  Discontinued        2 g 200 mL/hr over 30 Minutes Intravenous Every 24 hours 06/13/21 0655 06/13/21 1138   06/13/21 0115  cefTRIAXone (ROCEPHIN) 1 g in sodium chloride 0.9 % 100 mL IVPB        1 g 200 mL/hr over 30 Minutes Intravenous STAT 06/13/21 0103 06/13/21 0356   06/13/21 0115  metroNIDAZOLE (FLAGYL) IVPB 500 mg        500 mg 100 mL/hr over 60 Minutes Intravenous  Once 06/13/21 0103 06/13/21 0356       Subjective  The patient is resting comfortably. No new complaints.  Objective   Vitals:  Vitals:   06/15/21 0737 06/15/21 1517  BP: (!) 135/91 (!) 149/90  Pulse: 92 94  Resp: 19 20  Temp: 97.7 F (36.5 C) 97.9 F (36.6 C)  SpO2: 95% 97%    Exam:  Constitutional:  The patient is awake, alert, and oriented x 3. No acute distress. Respiratory:  No increased work of breathing. No wheezes, rales, or rhonchi No tactile fremitus Cardiovascular:  Regular rate and  rhythm No murmurs, ectopy, or gallups. No lateral PMI. No thrills. Abdomen:  Abdomen is soft, non-tender, non-distended No hernias, masses, or organomegaly Normoactive bowel sounds.  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 MRI L Spine: Degenerative arthritis vs evolving infection - short interval follow-up study is recommended to clarify; mild degenerative disc disease at L4-5 and L5-S1 with mild bilateral subarticular stenosis at L4-5; 1.9 cm lesion at  the posterior right kidney seen again. Recommendation from 06/02/2021 was for follow up renal ultrasound in 6 months. CT abdomen and pelvis: Moderate severity colitis involving ascending and proximal transverse.  Cardiology Data  EKG  Other Data    Scheduled Meds:  Chlorhexidine Gluconate Cloth  6 each Topical Daily   doxepin  100 mg Oral QHS   FLUoxetine  60 mg Oral Daily   folic acid  1 mg Oral Daily   heparin  5,000 Units Subcutaneous Q8H   multivitamin with minerals  1 tablet Oral Daily   OLANZapine  10 mg Oral QHS   OLANZapine  5 mg Oral Daily   pantoprazole  40 mg Oral QHS   tamsulosin  0.4 mg Oral QPC supper   thiamine  100 mg Oral Daily   Continuous Infusions:  sodium chloride Stopped (06/14/21 1439)   sodium chloride 100 mL/hr at 06/15/21 0624   cefTRIAXone (ROCEPHIN)  IV 2 g (06/15/21 1207)    Principal Problem:   Septic  shock (HCC) Active Problems:   Acute renal failure (HCC)   Non-traumatic rhabdomyolysis   LOS: 2 days   A & P  Suspected Sepsis with shock due to unknown source Resolved.  - Vitals signs are stable and he is saturating 97% on room air. - Blood cultures x 2 have had no growth. - Leukocytosis has resolved.  -Procalcitonin is declining - IV antibiotics: cefepime  & vancomycin  - Pressors have been discontinued  - Strict I/O's: alert provider if UOP < 0.5 mL/kg/hr  Colitis: Likely source of sepsis. Continue IV antibiotics. Monitor for improvement.   Rhabdomyolysis AGMA Hyperkalemia Acute Kidney Injury in the setting of suspected Sepsis and Rhabdomyolysis Baseline Cr: 0.93, Cr on admission: 7.18 The patient continues to receive IV fluids. Creatinine is down to 4.15 this morning. Renal ultrasound has been ordered. Nephrology has been consulted.  - Avoid nephrotoxic agents as able, ensure adequate renal perfusion  BLE & RUE weakness - MRI brain demonstrated no acute abnormality -MRI T & L-spine demonstrated:   Thoracic - 1. No evidence of infection or discitis-osteomyelitis of the thoracic spine. 2. Minimal degenerative disc disease. No foraminal or canal stenosis at any level within the thoracic spine. 3. Dependent bibasilar opacities with trace amount of pleural fluid bilaterally.  Lumbar:  1. Mild reactive marrow edema with small joint effusions about the right greater than left L4-5 and L5-S1 facets. Associated mild reactive edema within the adjacent posterior paraspinous soft tissues. Findings are favored to be related to be secondary to sterile/degenerative facet arthritis. Although felt to be unlikely, if there is continued clinical concern for persistent and/or evolving infection, a short interval follow-up study to evaluate for interval change would be suggested for further evaluation. 2. Mild degenerative disc disease at L4-5 and L5-S1 with resultant mild  bilateral subarticular stenosis at L4-5. 3. 1.9 cm T2 hypointense lesion at the posterior right kidney, indeterminate, but also seen on recent abdominal CT from 06/02/2021. Please refer to that previous exam for follow-up recommendations regarding this finding. PT/OT to eval and treat.  Mild Transaminitis in the setting of suspected sepsis and shock - Trend hepatic function - avoid hepatotoxic agents  Polysubstance abuse:  Cocaine and Opiate positive   Bipolar Disorder Anxiety & Depression PTSD - outpatient prozac, zyprexa and trazodone restarted - doxepin on hold due to renal function - supportive care  I have seen and examined this patient myself. I have spent 35 minutes in his evaluation and care.  DVT  prophylaxis: Heparin  Code Status: Full Code Family Communication: None available Disposition Plan: tbd    Shawntez Dickison, DO [facility] Direct contact: see [email or web address]  7PM-7AM contact night coverage as above 06/15/2021, 4:49 PM  LOS: 1 day

## 2021-06-15 NOTE — Plan of Care (Signed)
  Problem: Clinical Measurements: Goal: Ability to maintain clinical measurements within normal limits will improve Outcome: Progressing Goal: Will remain free from infection Outcome: Progressing Goal: Diagnostic test results will improve Outcome: Progressing Goal: Respiratory complications will improve Outcome: Progressing Goal: Cardiovascular complication will be avoided Outcome: Progressing   Problem: Pain Managment: Goal: General experience of comfort will improve Outcome: Progressing   Pt is involved in and agrees with the plan of care. V/S stable. Reports pain on his shoulder; oxycodone given.FC in place; draining good amount of urine.

## 2021-06-15 NOTE — Progress Notes (Signed)
Mobility Specialist - Progress Note   06/15/21 1545  Mobility  Activity Ambulated independently in hallway;Stood at bedside;Dangled on edge of bed  Level of Assistance Independent  Assistive Device Other (Comment) (IV Pole)  Activity Response Tolerated well  $Mobility charge 1 Mobility     Pt supine upon arrival using RA. Complains of shoulder pain but states it may be from a previous fall at home. Ambulates 156ft indep and tolerates well. Pt returns to room with needs in reach.  Clarisa Schools Mobility Specialist 06/15/21, 3:52 PM

## 2021-06-15 NOTE — Consult Note (Signed)
PHARMACY CONSULT NOTE  Pharmacy Consult for Electrolyte Monitoring and Replacement   Recent Labs: Potassium (mmol/L)  Date Value  06/15/2021 4.5   Magnesium (mg/dL)  Date Value  06/15/2021 2.0   Calcium (mg/dL)  Date Value  06/15/2021 8.8 (L)   Albumin (g/dL)  Date Value  06/13/2021 3.6   Phosphorus (mg/dL)  Date Value  06/14/2021 5.3 (H)   Sodium (mmol/L)  Date Value  06/15/2021 140   Assessment: Patient is a 61 y/o M with medical history including anxiety and depression, bipolar disorder, arthritis, hepatitis, HTN, pericarditis, PTSD who is admitted with septic shock due to unknown source and rhabdomyolysis. Pharmacy consulted to assist with electrolyte monitoring and replacement as indicated.  MIVF: lactated ringers at 125 cc/hr  Goal of Therapy:  Electrolytes within normal limits  Plan:  -No electrolyte replacement needed for 48hrs and patient transferred to medical floor -Will discontinue CCM and follow up as needed  Aikeem Lilley Rodriguez-Guzman PharmD, BCPS 06/15/2021 7:53 AM

## 2021-06-16 ENCOUNTER — Inpatient Hospital Stay: Payer: Medicaid Other

## 2021-06-16 DIAGNOSIS — R6521 Severe sepsis with septic shock: Secondary | ICD-10-CM | POA: Diagnosis not present

## 2021-06-16 DIAGNOSIS — A419 Sepsis, unspecified organism: Secondary | ICD-10-CM | POA: Diagnosis not present

## 2021-06-16 LAB — CBC
HCT: 32.4 % — ABNORMAL LOW (ref 39.0–52.0)
Hemoglobin: 10.7 g/dL — ABNORMAL LOW (ref 13.0–17.0)
MCH: 28.8 pg (ref 26.0–34.0)
MCHC: 33 g/dL (ref 30.0–36.0)
MCV: 87.1 fL (ref 80.0–100.0)
Platelets: 318 10*3/uL (ref 150–400)
RBC: 3.72 MIL/uL — ABNORMAL LOW (ref 4.22–5.81)
RDW: 15.5 % (ref 11.5–15.5)
WBC: 7.8 10*3/uL (ref 4.0–10.5)
nRBC: 0 % (ref 0.0–0.2)

## 2021-06-16 LAB — BASIC METABOLIC PANEL
Anion gap: 7 (ref 5–15)
BUN: 46 mg/dL — ABNORMAL HIGH (ref 6–20)
CO2: 21 mmol/L — ABNORMAL LOW (ref 22–32)
Calcium: 8.8 mg/dL — ABNORMAL LOW (ref 8.9–10.3)
Chloride: 111 mmol/L (ref 98–111)
Creatinine, Ser: 3.06 mg/dL — ABNORMAL HIGH (ref 0.61–1.24)
GFR, Estimated: 23 mL/min — ABNORMAL LOW (ref >60)
Glucose, Bld: 99 mg/dL (ref 70–99)
Potassium: 4.8 mmol/L (ref 3.5–5.1)
Sodium: 139 mmol/L (ref 135–145)

## 2021-06-16 LAB — GLUCOSE, CAPILLARY
Glucose-Capillary: 102 mg/dL — ABNORMAL HIGH (ref 70–99)
Glucose-Capillary: 104 mg/dL — ABNORMAL HIGH (ref 70–99)
Glucose-Capillary: 92 mg/dL (ref 70–99)
Glucose-Capillary: 96 mg/dL (ref 70–99)

## 2021-06-16 MED ORDER — AMOXICILLIN-POT CLAVULANATE 500-125 MG PO TABS
500.0000 mg | ORAL_TABLET | Freq: Two times a day (BID) | ORAL | Status: AC
  Administered 2021-06-16 – 2021-06-17 (×4): 500 mg via ORAL
  Filled 2021-06-16 (×4): qty 1

## 2021-06-16 MED ORDER — OXYCODONE-ACETAMINOPHEN 5-325 MG PO TABS
1.0000 | ORAL_TABLET | ORAL | Status: DC | PRN
  Administered 2021-06-16 – 2021-06-17 (×6): 2 via ORAL
  Filled 2021-06-16 (×6): qty 2

## 2021-06-16 NOTE — Progress Notes (Addendum)
PROGRESS NOTE  [name] OMB:[id number] DOB: [birth year] DOA: 06/12/2021 PCP: [name], PA  Brief History   61 yo M presenting to [facility] ED with multiple complaints, from a local motel where he is staying.  Per ED documentation, patient called EMS because of severe abdominal pain over the last several days as well as nausea and vomiting.  He reported it hurts when he moves around and presses on his abdomen. He also reported to EMS that wanted to kill himself. Also per ED documentation he reported dropping his psychiatric medicine on the floor a few days ago at the motel and was not able to collect it again so he has not been taking his medicine for the last couple of days.  Patient initially told the EDP and nursing that he had fallen and had abdominal pain while playing basketball. Of note the patient was admitted little over 2 weeks ago for cellulitis of the hand and a knee effusion, subsequently developing acute appendicitis undergoing an appendectomy approximately 9 days ago with a minor umbilical hernia repair by Dr. Claudine Mouton.  He was also expressing suicidal ideation and has a history of bipolar disorder.  During this hospitalization he was evaluated by psychiatry and was initially going to be admitted to the psychiatry unit but then reevaluated and determined to be appropriate for discharge.   Upon bedside assessment patient complaining of aching severe pain in bilateral hips, making it difficult to lift his legs off the bed or walk.  He reports having to   "walk a lot lately" because he is homeless.  He also stated his dog has been staying at the motel and has been urinating on the floor which he slipped in fell.  However, earlier he also stated he fell in the bathroom and hit his head on the tub.  He is complaining of feeling like he has " whiplash".  And that his right arm neck and head are also hurting.  He denies fever/chills, chest pain, shortness of breath and dysuria.  He does  admit to poor p.o. intake recently.  He denies tobacco use, regular alcohol use/recreational drug use.  He reported buying a bottle of vodka on 06/11/2021 but giving it away without having consumed any.   ED course: Upon arrival he told EDP that he was frustrated because he has been hurting so long and that he definitely does not want to kill himself, just wants to stop hurting.  Due to concern for postoperative complication CT abdomen pelvis performed showing moderate severity colitis and mild to moderate severity bibasilar atelectasis with a distended gallbladder without evidence of cholelithiasis.  Lab work revealed acute renal failure, AGMA, mild hyperkalemia, leukocytosis and lactic acidosis.  Sepsis protocol initiated.  While in ED after IV fluid resuscitation patient became hypotensive requiring peripheral vasopressor support.  Upon ICU assessment, patient now with new complaints of leg weakness and neck pain with possible fall described.  EDP ordered CT head and CT cervical spine which were unrevealing.  After further discussion MRI ordered for additional work-up of complaints. medications given: Zofran IV, 4 mg morphine IV, 3 L LR bolus, Lokelma, thiamine Initial Vitals: 97.6, 18, 97, 127/99 and SPO2 94% on room air, however within 2 hours of arrival patient hypotensive and tachycardic Significant labs: (Labs/ Imaging personally reviewed) I, Cheryll Cockayne Rust-Chester, AGACNP-BC, personally viewed and interpreted this ECG. EKG Interpretation: Date: 06/12/21, EKG Time: 23:47, Rate: 95, Rhythm: NSR, QRS Axis:  normal, Intervals: normal, ST/T Wave abnormalities: none, Narrative  Interpretation: NSR Chemistry: Na+:133, K+: 5.7, BUN/Cr.: 89/7.18, Serum CO2/ AG: 18/20, AST/ALT: 98/ 56, Mg: 2.4 Hematology: WBC: 20.4, Hgb: 11.0, plt: 247 CK: 3,464, Lactic/ PCT: 2.4 > 1.5 VBG: pending   CXR 06/13/21: No acute cardiopulmonary process CT head without contrast 06/13/2021: Negative for acute intracranial  abnormality CT cervical spine without contrast 06/13/2021: Mild reversal of cervical lordosis with degenerative change.  No acute osseous abnormality  Overnight the patient has stabilized.  He will be transferred to telemetry bed. Foley catheter has been placed for urinary retention. Flomax has been started.  Despite IV fluids the patient's creatinine remains elevated at 4.15 this morning. Renal ultrasound has been ordered and nephrology has been consulted.  Consultants  PCCM Nephrology  Procedures  None  Antibiotics   Anti-infectives (From admission, onward)    Start     Dose/Rate Route Frequency Ordered Stop   06/16/21 1300  amoxicillin-clavulanate (AUGMENTIN) 500-125 MG per tablet 500 mg        500 mg Oral Every 12 hours 06/16/21 1125 06/18/21 0959   06/14/21 1200  cefTRIAXone (ROCEPHIN) 2 g in sodium chloride 0.9 % 100 mL IVPB  Status:  Discontinued        2 g 200 mL/hr over 30 Minutes Intravenous Every 24 hours 06/13/21 1139 06/16/21 1125   06/13/21 0800  vancomycin (VANCOREADY) IVPB 2000 mg/400 mL        2,000 mg 200 mL/hr over 120 Minutes Intravenous  Once 06/13/21 0655 06/13/21 1023   06/13/21 0800  ceFEPIme (MAXIPIME) 2 g in sodium chloride 0.9 % 100 mL IVPB  Status:  Discontinued        2 g 200 mL/hr over 30 Minutes Intravenous Every 24 hours 06/13/21 0655 06/13/21 1138   06/13/21 0115  cefTRIAXone (ROCEPHIN) 1 g in sodium chloride 0.9 % 100 mL IVPB        1 g 200 mL/hr over 30 Minutes Intravenous STAT 06/13/21 0103 06/13/21 0356   06/13/21 0115  metroNIDAZOLE (FLAGYL) IVPB 500 mg        500 mg 100 mL/hr over 60 Minutes Intravenous  Once 06/13/21 0103 06/13/21 0356       Subjective  Pt reported no appetite.  Denied abdominal pain, N/V.  Large amount of urine output.   Objective   Exam:  Constitutional: NAD, AAOx3 HEENT: conjunctivae and lids normal, EOMI CV: No cyanosis.   RESP: normal respiratory effort, on RA Extremities: No effusions, edema in BLE SKIN:  warm, dry Neuro: II - XII grossly intact.      Today's Data  Vitals  Lab Data  BMP CK CBC Toxicology  Micro Data  Blood culture x 2: No growth  Imaging  CT Brain - negative CXR negative MRI Brain - No acute abnormality, small remote lacunar infarcts involving the right basal ganglia and left ventral pons. MRI L Spine: Degenerative arthritis vs evolving infection - short interval follow-up study is recommended to clarify; mild degenerative disc disease at L4-5 and L5-S1 with mild bilateral subarticular stenosis at L4-5; 1.9 cm lesion at the posterior right kidney seen again. Recommendation from 06/02/2021 was for follow up renal ultrasound in 6 months. CT abdomen and pelvis: Moderate severity colitis involving ascending and proximal transverse.  Cardiology Data  EKG  Other Data    Scheduled Meds:  amoxicillin-clavulanate  500 mg Oral Q12H   Chlorhexidine Gluconate Cloth  6 each Topical Daily   doxepin  100 mg Oral QHS   FLUoxetine  60 mg Oral Daily  folic acid  1 mg Oral Daily   heparin  5,000 Units Subcutaneous Q8H   multivitamin with minerals  1 tablet Oral Daily   OLANZapine  10 mg Oral QHS   OLANZapine  5 mg Oral Daily   pantoprazole  40 mg Oral QHS   tamsulosin  0.4 mg Oral QPC supper   thiamine  100 mg Oral Daily   Continuous Infusions:  sodium chloride Stopped (06/14/21 1439)   sodium chloride 100 mL/hr at 06/15/21 2215    Principal Problem:   Septic shock (HCC) Active Problems:   Acute renal failure (HCC)   Non-traumatic rhabdomyolysis   LOS: 3 days   A & P  Suspected Sepsis with shock  Resolved.  --tachycardia, leukocytosis, hypotensive needing pressors  Colitis:  Likely source of sepsis.  --received ceftriaxone/flagyl, then vanc/cefepime, then back to ceftriaxone Plan: --switch to Augmentin   Rhabdomyolysis --presumed due to falls.  3464 on presentation, trended down to 606. --cont MIVF  Acute Kidney Injury  in the setting of suspected  Sepsis and Rhabdomyolysis Baseline Cr: 0.93, Cr on admission: 7.18.  Cr improved with IVF Plan: --cont MIVF@100   Hyperkalemia, resolved  Metabolic acidosis, improved --due to severe AKI  BLE & RUE weakness - MRI brain demonstrated no acute abnormality -MRI C T & L-spine showed Findings favored to be related to be secondary to sterile/degenerative facet arthritis.  Mild Transaminitis in the setting of suspected sepsis and shock  Polysubstance abuse:  Cocaine and Opiate positive   Bipolar Disorder Anxiety & Depression PTSD - cont prozac, zyprexa and trazodone  - cont doxepin    DVT prophylaxis: Heparin  Code Status: Full Code Family Communication: None available Disposition Plan: home

## 2021-06-16 NOTE — Plan of Care (Signed)
  Problem: Clinical Measurements: Goal: Ability to maintain clinical measurements within normal limits will improve Outcome: Progressing Goal: Will remain free from infection Outcome: Progressing Goal: Diagnostic test results will improve Outcome: Progressing Goal: Respiratory complications will improve Outcome: Progressing Goal: Cardiovascular complication will be avoided Outcome: Progressing   Problem: Pain Managment: Goal: General experience of comfort will improve Outcome: Progressing   

## 2021-06-16 NOTE — Plan of Care (Signed)

## 2021-06-16 NOTE — TOC Initial Note (Signed)
Transition of Care [facility]) - Initial/Assessment Note    Patient Details  Name: [name] MRN: [id number] Date of Birth: 04-16-1960  Transition of [facility]) CM/SW Contact:    Candie Chroman, LCSW Phone Number: 06/16/2021, 10:21 AM  Clinical Narrative:  Readmission prevention screen complete. Met with patient. No supports at bedside. CSW introduced role and explained that discharge planning would be discussed. Patient lives home alone. PCP is Ranae Plumber, [place]. A friend takes him to appointments. Pharmacy is [facility] in [place]. No issues obtaining medications. No home health or DME use prior to admission. He is unsure how he will get home at discharge. Encouraged him to go ahead and call friend to see if he can do it. No further concerns. CSW encouraged patient to contact CSW as needed. CSW will continue to follow patient for support and facilitate return home once stable.                Expected Discharge Plan: Home/Self Care Barriers to Discharge: Continued Medical Work up   Patient Goals and CMS Choice        Expected Discharge Plan and Services Expected Discharge Plan: Home/Self Care     Post Acute Care Choice: NA Living arrangements for the past 2 months: Single Family Home                                      Prior Living Arrangements/Services Living arrangements for the past 2 months: Single Family Home Lives with:: Self Patient language and need for interpreter reviewed:: Yes Do you feel safe going back to the place where you live?: Yes            Criminal Activity/Legal Involvement Pertinent to Current Situation/Hospitalization: No - Comment as needed  Activities of Daily Living      Permission Sought/Granted                  Emotional Assessment Appearance:: Appears stated age Attitude/Demeanor/Rapport: Engaged Affect (typically observed): Accepting, Calm, Flat Orientation: : Oriented to Self, Oriented to Place, Oriented to  Time,  Oriented to Situation Alcohol / Substance Use: Not Applicable Psych Involvement: No (comment)  Admission diagnosis:  Hyperkalemia [E87.5] Septic shock ([place]) [A41.9, R65.21] Acute renal failure, unspecified acute renal failure type [facility]) [N17.9] Patient Active Problem List   Diagnosis Date Noted   Septic shock ([place]) 06/13/2021   Acute renal failure ([place])    Non-traumatic rhabdomyolysis    Acute appendicitis 06/02/2021   Cellulitis of right hand 05/29/2021   Effusion of bursa of left knee 05/29/2021   Depression 05/29/2021   Major depressive disorder, recurrent episode, moderate ([place]) 05/15/2021   Cluster B personality disorder ([place]) 05/03/2021   Bipolar disorder ([place]) 01/18/2021   Essential hypertension 01/18/2021   Alcohol abuse 01/18/2021   PCP:  Ranae Plumber, [place] Pharmacy:   [id number] [address] [address] Phone: [phone] Fax: [phone]  [address] [place] [address] Phone: [phone] Fax: [phone]     Social Determinants of Health (SDOH) Interventions    Readmission Risk Interventions    06/16/2021   10:20 AM  Readmission Risk Prevention Plan  Transportation Screening Complete  Medication Review (RN Care Manager) Complete  PCP or Specialist appointment within 3-5  days of discharge Complete  SW Recovery Care/Counseling Consult Complete  Palliative Care Screening Not [place] Not Applicable

## 2021-06-17 DIAGNOSIS — R6521 Severe sepsis with septic shock: Secondary | ICD-10-CM | POA: Diagnosis not present

## 2021-06-17 DIAGNOSIS — A419 Sepsis, unspecified organism: Secondary | ICD-10-CM | POA: Diagnosis not present

## 2021-06-17 LAB — CBC
HCT: 34.6 % — ABNORMAL LOW (ref 39.0–52.0)
Hemoglobin: 11.3 g/dL — ABNORMAL LOW (ref 13.0–17.0)
MCH: 28.3 pg (ref 26.0–34.0)
MCHC: 32.7 g/dL (ref 30.0–36.0)
MCV: 86.7 fL (ref 80.0–100.0)
Platelets: 354 10*3/uL (ref 150–400)
RBC: 3.99 MIL/uL — ABNORMAL LOW (ref 4.22–5.81)
RDW: 14.9 % (ref 11.5–15.5)
WBC: 6.7 10*3/uL (ref 4.0–10.5)
nRBC: 0 % (ref 0.0–0.2)

## 2021-06-17 LAB — GLUCOSE, CAPILLARY
Glucose-Capillary: 103 mg/dL — ABNORMAL HIGH (ref 70–99)
Glucose-Capillary: 104 mg/dL — ABNORMAL HIGH (ref 70–99)
Glucose-Capillary: 122 mg/dL — ABNORMAL HIGH (ref 70–99)
Glucose-Capillary: 125 mg/dL — ABNORMAL HIGH (ref 70–99)
Glucose-Capillary: 91 mg/dL (ref 70–99)

## 2021-06-17 LAB — BASIC METABOLIC PANEL
Anion gap: 6 (ref 5–15)
BUN: 35 mg/dL — ABNORMAL HIGH (ref 6–20)
CO2: 21 mmol/L — ABNORMAL LOW (ref 22–32)
Calcium: 9.4 mg/dL (ref 8.9–10.3)
Chloride: 114 mmol/L — ABNORMAL HIGH (ref 98–111)
Creatinine, Ser: 2.18 mg/dL — ABNORMAL HIGH (ref 0.61–1.24)
GFR, Estimated: 34 mL/min — ABNORMAL LOW (ref >60)
Glucose, Bld: 108 mg/dL — ABNORMAL HIGH (ref 70–99)
Potassium: 5 mmol/L (ref 3.5–5.1)
Sodium: 141 mmol/L (ref 135–145)

## 2021-06-17 LAB — BLOOD GAS, VENOUS
Acid-base deficit: 0.4 mmol/L (ref 0.0–2.0)
Bicarbonate: 22.9 mmol/L (ref 20.0–28.0)
O2 Saturation: 98.8 %
Patient temperature: 37
pCO2, Ven: 33 mmHg — ABNORMAL LOW (ref 44–60)
pH, Ven: 7.45 — ABNORMAL HIGH (ref 7.25–7.43)
pO2, Ven: 88 mmHg — ABNORMAL HIGH (ref 32–45)

## 2021-06-17 LAB — MAGNESIUM: Magnesium: 1.7 mg/dL (ref 1.7–2.4)

## 2021-06-17 NOTE — Progress Notes (Signed)
PROGRESS NOTE  [name] OMB:[id number] DOB: [birth year] DOA: 06/12/2021 PCP: [name], PA  Brief History   61 yo M presenting to [facility] ED with multiple complaints, from a local motel where he is staying.  Per ED documentation, patient called EMS because of severe abdominal pain over the last several days as well as nausea and vomiting.  He reported it hurts when he moves around and presses on his abdomen. He also reported to EMS that wanted to kill himself. Also per ED documentation he reported dropping his psychiatric medicine on the floor a few days ago at the motel and was not able to collect it again so he has not been taking his medicine for the last couple of days.  Patient initially told the EDP and nursing that he had fallen and had abdominal pain while playing basketball. Of note the patient was admitted little over 2 weeks ago for cellulitis of the hand and a knee effusion, subsequently developing acute appendicitis undergoing an appendectomy approximately 9 days ago with a minor umbilical hernia repair by Dr. Claudine Mouton.  He was also expressing suicidal ideation and has a history of bipolar disorder.  During this hospitalization he was evaluated by psychiatry and was initially going to be admitted to the psychiatry unit but then reevaluated and determined to be appropriate for discharge.   Upon bedside assessment patient complaining of aching severe pain in bilateral hips, making it difficult to lift his legs off the bed or walk.  He reports having to   "walk a lot lately" because he is homeless.  He also stated his dog has been staying at the motel and has been urinating on the floor which he slipped in fell.  However, earlier he also stated he fell in the bathroom and hit his head on the tub.  He is complaining of feeling like he has " whiplash".  And that his right arm neck and head are also hurting.  He denies fever/chills, chest pain, shortness of breath and dysuria.  He does  admit to poor p.o. intake recently.  He denies tobacco use, regular alcohol use/recreational drug use.  He reported buying a bottle of vodka on 06/11/2021 but giving it away without having consumed any.   ED course: Upon arrival he told EDP that he was frustrated because he has been hurting so long and that he definitely does not want to kill himself, just wants to stop hurting.  Due to concern for postoperative complication CT abdomen pelvis performed showing moderate severity colitis and mild to moderate severity bibasilar atelectasis with a distended gallbladder without evidence of cholelithiasis.  Lab work revealed acute renal failure, AGMA, mild hyperkalemia, leukocytosis and lactic acidosis.  Sepsis protocol initiated.  While in ED after IV fluid resuscitation patient became hypotensive requiring peripheral vasopressor support.  Upon ICU assessment, patient now with new complaints of leg weakness and neck pain with possible fall described.  EDP ordered CT head and CT cervical spine which were unrevealing.  After further discussion MRI ordered for additional work-up of complaints. medications given: Zofran IV, 4 mg morphine IV, 3 L LR bolus, Lokelma, thiamine Initial Vitals: 97.6, 18, 97, 127/99 and SPO2 94% on room air, however within 2 hours of arrival patient hypotensive and tachycardic Significant labs: (Labs/ Imaging personally reviewed) I, Cheryll Cockayne Rust-[place]ster, AGACNP-BC, personally viewed and interpreted this ECG. EKG Interpretation: Date: 06/12/21, EKG Time: 23:47, Rate: 95, Rhythm: NSR, QRS Axis:  normal, Intervals: normal, ST/T Wave abnormalities: none, Narrative  Interpretation: NSR Chemistry: Na+:133, K+: 5.7, BUN/Cr.: 89/7.18, Serum CO2/ AG: 18/20, AST/ALT: 98/ 56, Mg: 2.4 Hematology: WBC: 20.4, Hgb: 11.0, plt: 247 CK: 3,464, Lactic/ PCT: 2.4 > 1.5 VBG: pending   CXR 06/13/21: No acute cardiopulmonary process CT head without contrast 06/13/2021: Negative for acute intracranial  abnormality CT cervical spine without contrast 06/13/2021: Mild reversal of cervical lordosis with degenerative change.  No acute osseous abnormality  Overnight the patient has stabilized.  He will be transferred to telemetry bed. Foley catheter has been placed for urinary retention. Flomax has been started.  Despite IV fluids the patient's creatinine remains elevated at 4.15 this morning. Renal ultrasound has been ordered and nephrology has been consulted.  Consultants  PCCM Nephrology  Procedures  None  Antibiotics   Anti-infectives (From admission, onward)    Start     Dose/Rate Route Frequency Ordered Stop   06/16/21 1300  amoxicillin-clavulanate (AUGMENTIN) 500-125 MG per tablet 500 mg        500 mg Oral Every 12 hours 06/16/21 1125 06/18/21 0959   06/14/21 1200  cefTRIAXone (ROCEPHIN) 2 g in sodium chloride 0.9 % 100 mL IVPB  Status:  Discontinued        2 g 200 mL/hr over 30 Minutes Intravenous Every 24 hours 06/13/21 1139 06/16/21 1125   06/13/21 0800  vancomycin (VANCOREADY) IVPB 2000 mg/400 mL        2,000 mg 200 mL/hr over 120 Minutes Intravenous  Once 06/13/21 0655 06/13/21 1023   06/13/21 0800  ceFEPIme (MAXIPIME) 2 g in sodium chloride 0.9 % 100 mL IVPB  Status:  Discontinued        2 g 200 mL/hr over 30 Minutes Intravenous Every 24 hours 06/13/21 0655 06/13/21 1138   06/13/21 0115  cefTRIAXone (ROCEPHIN) 1 g in sodium chloride 0.9 % 100 mL IVPB        1 g 200 mL/hr over 30 Minutes Intravenous STAT 06/13/21 0103 06/13/21 0356   06/13/21 0115  metroNIDAZOLE (FLAGYL) IVPB 500 mg        500 mg 100 mL/hr over 60 Minutes Intravenous  Once 06/13/21 0103 06/13/21 0356       Subjective  Pt reported feeling better.  Cr improved.   Objective   Exam:  Constitutional: NAD, AAOx3 HEENT: conjunctivae and lids normal, EOMI CV: No cyanosis.   RESP: normal respiratory effort, on RA Neuro: II - XII grossly intact.   Psych: Normal mood and affect.  Appropriate judgement  and reason     Today's Data  Vitals  Lab Data  BMP CK CBC Toxicology  Micro Data  Blood culture x 2: No growth  Imaging  CT Brain - negative CXR negative MRI Brain - No acute abnormality, small remote lacunar infarcts involving the right basal ganglia and left ventral pons. MRI L Spine: Degenerative arthritis vs evolving infection - short interval follow-up study is recommended to clarify; mild degenerative disc disease at L4-5 and L5-S1 with mild bilateral subarticular stenosis at L4-5; 1.9 cm lesion at the posterior right kidney seen again. Recommendation from 06/02/2021 was for follow up renal ultrasound in 6 months. CT abdomen and pelvis: Moderate severity colitis involving ascending and proximal transverse.  Cardiology Data  EKG  Other Data    Scheduled Meds:  amoxicillin-clavulanate  500 mg Oral Q12H   Chlorhexidine Gluconate Cloth  6 each Topical Daily   doxepin  100 mg Oral QHS   FLUoxetine  60 mg Oral Daily   folic acid  1 mg  Oral Daily   heparin  5,000 Units Subcutaneous Q8H   multivitamin with minerals  1 tablet Oral Daily   OLANZapine  10 mg Oral QHS   OLANZapine  5 mg Oral Daily   pantoprazole  40 mg Oral QHS   tamsulosin  0.4 mg Oral QPC supper   thiamine  100 mg Oral Daily   Continuous Infusions:  sodium chloride Stopped (06/14/21 1439)    Principal Problem:   Septic shock (HCC) Active Problems:   Acute renal failure (HCC)   Non-traumatic rhabdomyolysis   LOS: 4 days   A & P  Suspected Sepsis with shock  Resolved.  --tachycardia, leukocytosis, hypotensive needing pressors  Colitis:  Likely source of sepsis.  --received ceftriaxone/flagyl, then vanc/cefepime, then back to ceftriaxone, then switched to augmentin Plan: --cont augmentin   Rhabdomyolysis --presumed due to falls.  3464 on presentation, trended down to 606. --d/c MIVF today  Acute Kidney Injury  in the setting of suspected Sepsis and Rhabdomyolysis Baseline Cr: 0.93, Cr  on admission: 7.18.  Cr improved with IVF Plan: --d/c MIVF today and encourage oral hydration  Hyperkalemia, resolved  Metabolic acidosis, improved --due to severe AKI  BLE & RUE weakness - MRI brain demonstrated no acute abnormality -MRI C T & L-spine showed Findings favored to be related to be secondary to sterile/degenerative facet arthritis.  Mild Transaminitis in the setting of suspected sepsis and shock  Polysubstance abuse:  Cocaine and Opiate positive   Bipolar Disorder Anxiety & Depression PTSD - cont prozac, zyprexa and trazodone  - cont doxepin   Acute urinary retention --remove foley today for voiding trial --cont Flomax   DVT prophylaxis: Heparin  Code Status: Full Code Family Communication: None available Disposition Plan: home

## 2021-06-17 NOTE — Progress Notes (Signed)
Mobility Specialist - Progress Note   06/17/21 1200  Mobility  Activity Refused mobility     Pt refused mobility despite encouragement, no reason specified.  Merrily Brittle Mobility Specialist 06/17/21, 12:53 PM

## 2021-06-18 ENCOUNTER — Inpatient Hospital Stay
Admission: RE | Admit: 2021-06-18 | Discharge: 2021-06-22 | DRG: 885 | Disposition: A | Discharge status: Home / Self Care | Payer: Medicaid Other | Source: Ambulatory Visit | Attending: Psychiatry | Admitting: Psychiatry

## 2021-06-18 ENCOUNTER — Other Ambulatory Visit: Payer: Self-pay

## 2021-06-18 ENCOUNTER — Encounter: Payer: Self-pay | Admitting: Psychiatry

## 2021-06-18 DIAGNOSIS — R45851 Suicidal ideations: Secondary | ICD-10-CM | POA: Diagnosis present

## 2021-06-18 DIAGNOSIS — K219 Gastro-esophageal reflux disease without esophagitis: Secondary | ICD-10-CM | POA: Diagnosis present

## 2021-06-18 DIAGNOSIS — F419 Anxiety disorder, unspecified: Secondary | ICD-10-CM | POA: Diagnosis present

## 2021-06-18 DIAGNOSIS — Z818 Family history of other mental and behavioral disorders: Secondary | ICD-10-CM

## 2021-06-18 DIAGNOSIS — Z87891 Personal history of nicotine dependence: Secondary | ICD-10-CM

## 2021-06-18 DIAGNOSIS — I10 Essential (primary) hypertension: Secondary | ICD-10-CM | POA: Diagnosis present

## 2021-06-18 DIAGNOSIS — Z5900 Homelessness unspecified: Secondary | ICD-10-CM | POA: Diagnosis not present

## 2021-06-18 DIAGNOSIS — F6089 Other specific personality disorders: Secondary | ICD-10-CM | POA: Diagnosis present

## 2021-06-18 DIAGNOSIS — F332 Major depressive disorder, recurrent severe without psychotic features: Secondary | ICD-10-CM | POA: Diagnosis present

## 2021-06-18 DIAGNOSIS — A419 Sepsis, unspecified organism: Secondary | ICD-10-CM | POA: Diagnosis not present

## 2021-06-18 DIAGNOSIS — R6521 Severe sepsis with septic shock: Secondary | ICD-10-CM | POA: Diagnosis not present

## 2021-06-18 DIAGNOSIS — Z79899 Other long term (current) drug therapy: Secondary | ICD-10-CM | POA: Diagnosis not present

## 2021-06-18 DIAGNOSIS — G4700 Insomnia, unspecified: Secondary | ICD-10-CM | POA: Diagnosis present

## 2021-06-18 DIAGNOSIS — Z96653 Presence of artificial knee joint, bilateral: Secondary | ICD-10-CM | POA: Diagnosis present

## 2021-06-18 LAB — GLUCOSE, CAPILLARY
Glucose-Capillary: 100 mg/dL — ABNORMAL HIGH (ref 70–99)
Glucose-Capillary: 102 mg/dL — ABNORMAL HIGH (ref 70–99)
Glucose-Capillary: 109 mg/dL — ABNORMAL HIGH (ref 70–99)
Glucose-Capillary: 120 mg/dL — ABNORMAL HIGH (ref 70–99)
Glucose-Capillary: 129 mg/dL — ABNORMAL HIGH (ref 70–99)
Glucose-Capillary: 145 mg/dL — ABNORMAL HIGH (ref 70–99)
Glucose-Capillary: 97 mg/dL (ref 70–99)

## 2021-06-18 LAB — CBC
HCT: 37.3 % — ABNORMAL LOW (ref 39.0–52.0)
Hemoglobin: 12 g/dL — ABNORMAL LOW (ref 13.0–17.0)
MCH: 28 pg (ref 26.0–34.0)
MCHC: 32.2 g/dL (ref 30.0–36.0)
MCV: 87.1 fL (ref 80.0–100.0)
Platelets: 356 10*3/uL (ref 150–400)
RBC: 4.28 MIL/uL (ref 4.22–5.81)
RDW: 14.9 % (ref 11.5–15.5)
WBC: 6.3 10*3/uL (ref 4.0–10.5)
nRBC: 0 % (ref 0.0–0.2)

## 2021-06-18 LAB — CULTURE, BLOOD (ROUTINE X 2)
Culture: NO GROWTH
Culture: NO GROWTH
Special Requests: ADEQUATE
Special Requests: ADEQUATE

## 2021-06-18 LAB — BASIC METABOLIC PANEL
Anion gap: 7 (ref 5–15)
BUN: 30 mg/dL — ABNORMAL HIGH (ref 6–20)
CO2: 21 mmol/L — ABNORMAL LOW (ref 22–32)
Calcium: 9.5 mg/dL (ref 8.9–10.3)
Chloride: 111 mmol/L (ref 98–111)
Creatinine, Ser: 1.94 mg/dL — ABNORMAL HIGH (ref 0.61–1.24)
GFR, Estimated: 39 mL/min — ABNORMAL LOW (ref >60)
Glucose, Bld: 98 mg/dL (ref 70–99)
Potassium: 5.1 mmol/L (ref 3.5–5.1)
Sodium: 139 mmol/L (ref 135–145)

## 2021-06-18 LAB — MAGNESIUM: Magnesium: 1.8 mg/dL (ref 1.7–2.4)

## 2021-06-18 LAB — SARS CORONAVIRUS 2 BY RT PCR: SARS Coronavirus 2 by RT PCR: NEGATIVE

## 2021-06-18 MED ORDER — FOLIC ACID 1 MG PO TABS: 1.0000 mg | ORAL_TABLET | Freq: Every day | ORAL | Status: DC

## 2021-06-18 MED ORDER — FLUOXETINE HCL 20 MG PO CAPS
60.0000 mg | ORAL_CAPSULE | Freq: Every day | ORAL | Status: DC
  Administered 2021-06-19 – 2021-06-22 (×4): 60 mg via ORAL
  Filled 2021-06-18 (×4): qty 3

## 2021-06-18 MED ORDER — ACETAMINOPHEN 325 MG PO TABS
650.0000 mg | ORAL_TABLET | Freq: Four times a day (QID) | ORAL | Status: DC | PRN
  Administered 2021-06-18 – 2021-06-21 (×2): 650 mg via ORAL
  Filled 2021-06-18 (×2): qty 2

## 2021-06-18 MED ORDER — METOPROLOL SUCCINATE ER 25 MG PO TB24
50.0000 mg | ORAL_TABLET | Freq: Every day | ORAL | Status: DC
  Administered 2021-06-19 – 2021-06-22 (×3): 50 mg via ORAL
  Filled 2021-06-18 (×4): qty 2

## 2021-06-18 MED ORDER — TAMSULOSIN HCL 0.4 MG PO CAPS
0.4000 mg | ORAL_CAPSULE | Freq: Every day | ORAL | Status: DC
  Administered 2021-06-19 – 2021-06-21 (×3): 0.4 mg via ORAL
  Filled 2021-06-18 (×3): qty 1

## 2021-06-18 MED ORDER — OLANZAPINE 10 MG PO TABS: 10.0000 mg | ORAL_TABLET | Freq: Every day | ORAL | 2 refills | Status: DC

## 2021-06-18 MED ORDER — TRAZODONE HCL 50 MG PO TABS
25.0000 mg | ORAL_TABLET | Freq: Every evening | ORAL | Status: DC | PRN
  Administered 2021-06-18 – 2021-06-20 (×3): 25 mg via ORAL
  Filled 2021-06-18 (×4): qty 1

## 2021-06-18 MED ORDER — ENALAPRIL MALEATE 10 MG PO TABS: ORAL_TABLET | ORAL | 0 refills | Status: DC

## 2021-06-18 MED ORDER — THIAMINE HCL 100 MG PO TABS
100.0000 mg | ORAL_TABLET | Freq: Every day | ORAL | Status: DC
  Administered 2021-06-19 – 2021-06-22 (×4): 100 mg via ORAL
  Filled 2021-06-18 (×4): qty 1

## 2021-06-18 MED ORDER — NAPROXEN 500 MG PO TABS: 500.0000 mg | ORAL_TABLET | Freq: Two times a day (BID) | ORAL | 0 refills | Status: DC | PRN

## 2021-06-18 MED ORDER — POLYETHYLENE GLYCOL 3350 17 G PO PACK: 17.0000 g | PACK | Freq: Every day | ORAL | Status: DC | PRN

## 2021-06-18 MED ORDER — NALTREXONE HCL 50 MG PO TABS
50.0000 mg | ORAL_TABLET | Freq: Every day | ORAL | Status: DC
  Administered 2021-06-19 – 2021-06-22 (×4): 50 mg via ORAL
  Filled 2021-06-18 (×4): qty 1

## 2021-06-18 MED ORDER — METOPROLOL SUCCINATE ER 50 MG PO TB24
50.0000 mg | ORAL_TABLET | Freq: Every day | ORAL | Status: DC
  Administered 2021-06-18: 50 mg via ORAL
  Filled 2021-06-18: qty 1

## 2021-06-18 MED ORDER — ALUM & MAG HYDROXIDE-SIMETH 200-200-20 MG/5ML PO SUSP: 30.0000 mL | ORAL | Status: DC | PRN

## 2021-06-18 MED ORDER — OLANZAPINE 5 MG PO TABS: 5.0000 mg | ORAL_TABLET | Freq: Every day | ORAL | 2 refills | Status: DC

## 2021-06-18 MED ORDER — TRAZODONE HCL 50 MG PO TABS: 25.0000 mg | ORAL_TABLET | Freq: Every evening | ORAL | Status: DC | PRN

## 2021-06-18 MED ORDER — METOPROLOL SUCCINATE ER 50 MG PO TB24: 50.0000 mg | ORAL_TABLET | Freq: Every day | ORAL | 2 refills | Status: DC

## 2021-06-18 MED ORDER — OLANZAPINE 5 MG PO TABS
5.0000 mg | ORAL_TABLET | Freq: Every day | ORAL | Status: DC
  Administered 2021-06-19 – 2021-06-22 (×4): 5 mg via ORAL
  Filled 2021-06-18 (×4): qty 1

## 2021-06-18 MED ORDER — MAGNESIUM HYDROXIDE 400 MG/5ML PO SUSP: 30.0000 mL | Freq: Every day | ORAL | Status: DC | PRN

## 2021-06-18 MED ORDER — ADULT MULTIVITAMIN W/MINERALS CH
1.0000 | ORAL_TABLET | Freq: Every day | ORAL | Status: DC
  Administered 2021-06-19 – 2021-06-22 (×4): 1 via ORAL
  Filled 2021-06-18 (×4): qty 1

## 2021-06-18 MED ORDER — ADULT MULTIVITAMIN W/MINERALS CH: 1.0000 | ORAL_TABLET | Freq: Every day | ORAL | Status: DC

## 2021-06-18 MED ORDER — FOLIC ACID 1 MG PO TABS
1.0000 mg | ORAL_TABLET | Freq: Every day | ORAL | Status: DC
  Administered 2021-06-19 – 2021-06-22 (×4): 1 mg via ORAL
  Filled 2021-06-18 (×4): qty 1

## 2021-06-18 MED ORDER — FLUOXETINE HCL 20 MG PO CAPS: 60.0000 mg | ORAL_CAPSULE | Freq: Every day | ORAL | 2 refills | Status: DC

## 2021-06-18 MED ORDER — THIAMINE HCL 100 MG PO TABS: 100.0000 mg | ORAL_TABLET | Freq: Every day | ORAL | Status: DC

## 2021-06-18 MED ORDER — PANTOPRAZOLE SODIUM 40 MG PO TBEC
40.0000 mg | DELAYED_RELEASE_TABLET | Freq: Every day | ORAL | Status: DC
  Administered 2021-06-18 – 2021-06-21 (×4): 40 mg via ORAL
  Filled 2021-06-18 (×4): qty 1

## 2021-06-18 MED ORDER — OLANZAPINE 5 MG PO TABS
10.0000 mg | ORAL_TABLET | Freq: Every day | ORAL | Status: DC
  Administered 2021-06-18 – 2021-06-21 (×4): 10 mg via ORAL
  Filled 2021-06-18 (×4): qty 2

## 2021-06-18 MED ORDER — DOCUSATE SODIUM 100 MG PO CAPS: 100.0000 mg | ORAL_CAPSULE | Freq: Two times a day (BID) | ORAL | Status: DC | PRN

## 2021-06-18 MED ORDER — DOXEPIN HCL 100 MG PO CAPS
100.0000 mg | ORAL_CAPSULE | Freq: Every day | ORAL | Status: DC
  Administered 2021-06-18 – 2021-06-21 (×4): 100 mg via ORAL
  Filled 2021-06-18 (×4): qty 1

## 2021-06-18 NOTE — Plan of Care (Signed)

## 2021-06-18 NOTE — Consult Note (Signed)
[facility] Consult   Reason for Consult:  suicidal comment Referring Physician:  Dr Fran LowesLai Patient Identification: [name] MRN:  [id number] Principal Diagnosis: Septic shock [facility](HCC) Diagnosis:  Principal Problem:   Septic shock (HCC) Active Problems:   Cluster B personality disorder (HCC)   Major depressive disorder, recurrent episode, moderate (HCC)   Acute renal failure (HCC)   Non-traumatic rhabdomyolysis   Total Time spent with patient: 50 minutes  Subjective:   Victor FearingJames Colon Katrinka [name] is a 61 y.o. male patient admitted with medical issues, now reporting suicidal ideations.  HPI:  61 yo male who was admitted for medical issues.  He reported to staff this am that he did not feel safe as he wanted to hurt himself.  He reports high depression with suicidal ideations with recent plan to hang himself.  High anxiety with no panic attacks.  It is the anniversary of his brother's suicide and "can't handle it."  No psychosis, paranoia, or withdrawal symptoms.  He does use cocaine frequently when not in the hospital.  [facility] for psych admission as [facility].  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 echo 5/17: EF 60-65%, no RWMA, LV dias fxn nl, LA mildly dilated, RV sys fxn nl, PASP nl, moderate sized circumferential pericardial effusion was identified, 2.12 cm around the LV free wall, <1 cm around the RV free wall. Features were not c/w tamponade physiology   PTSD  (post-traumatic stress disorder)    Witnessed brother's suicide.   Restless leg syndrome    Syncope     Past Surgical History:  Procedure Laterality Date   CYSTOSCOPY WITH URETEROSCOPY AND STENT PLACEMENT     ESOPHAGOGASTRODUODENOSCOPY N/A 01/11/2016   Procedure: ESOPHAGOGASTRODUODENOSCOPY (EGD);  Surgeon: Charlott RakesVincent Schooler, MD;  [facility];  Service: Endoscopy;  Laterality: N/A;   ESOPHAGOGASTRODUODENOSCOPY N/A 04/09/2020   Procedure: ESOPHAGOGASTRODUODENOSCOPY (EGD);  Surgeon: Wyline MoodAnna, Kiran, MD;  [facility];  Service: Gastroenterology;  Laterality: N/A;   INCISION AND DRAINAGE ABSCESS Left 01/02/2018   Procedure: INCISION AND DRAINAGE LEFT KNEE;  Surgeon: Deeann SaintMiller, Howard, MD;  Location: [facility] ORS;  Service: Orthopedics;  Laterality: Left;   JOINT REPLACEMENT Right    TKR   KNEE ARTHROSCOPY Right 06/25/2014   Procedure: ARTHROSCOPY KNEE;  Surgeon: Deeann SaintHoward Miller, MD;  Location: [facility] ORS;  Service: Orthopedics;  Laterality: Right;  partial arthroscopic medial menisectomy   LAPAROSCOPIC APPENDECTOMY N/A 06/02/2021   Procedure: APPENDECTOMY LAPAROSCOPIC;  Surgeon: Campbell Lernerodenberg, Denny, MD;  Location: [facility] ORS;  Service: General;  Laterality: N/A;   TOTAL KNEE ARTHROPLASTY Right 04/22/2015   Procedure: TOTAL KNEE ARTHROPLASTY;  Surgeon: Deeann SaintHoward Miller, MD;  Location: [facility] ORS;  Service: Orthopedics;  Laterality: Right;   TOTAL KNEE ARTHROPLASTY Left 10/30/2017   Procedure: TOTAL KNEE ARTHROPLASTY;  Surgeon: Deeann SaintMiller, Howard, MD;  Location: [facility] ORS;  Service: Orthopedics;  Laterality: Left;   TOTAL KNEE REVISION  Left 01/02/2018   Procedure: poly exchange of tibia and patella left knee;  Surgeon: Deeann Saint, MD;  Location: [facility] ORS;  Service: Orthopedics;  Laterality: Left;   UMBILICAL HERNIA REPAIR  06/02/2021   Procedure: HERNIA REPAIR UMBILICAL ADULT;  Surgeon: Campbell Lerner, MD;  Location: [facility] ORS;  Service: General;;   Family History:  Family History  Problem Relation  Age of Onset   CVA Mother        deceased at age 41   Depression Brother        Died by suicide at age 26   Family Psychiatric  History: see above Social History:  Social History   Substance and Sexual Activity  Alcohol Use Yes     Social History   Substance and Sexual Activity  Drug Use Yes   Types: Marijuana    Social History   Socioeconomic History   Marital status: Single    Spouse name: Not on file   Number of children: Not on file   Years of education: Not on file   Highest education level: Not on file  Occupational History   Not on file  Tobacco Use   Smoking status: Former    Packs/day: 0.75    Years: 20.00    Total pack years: 15.00    Types: Cigarettes    Quit date: 05/16/1984    Years since quitting: 37.1   Smokeless tobacco: Never  Vaping Use   Vaping Use: Never used  Substance and Sexual Activity   Alcohol use: Yes   Drug use: Yes    Types: Marijuana   Sexual activity: Not on file  Other Topics Concern   Not on file  Social History Narrative   Not on file   Social Determinants of Health   Financial Resource Strain: Not on file  Food Insecurity: Not on file  Transportation Needs: Not on file  Physical Activity: Not on file  Stress: Not on file  Social Connections: Not on file   Additional Social History:    Allergies:  No Known Allergies  Labs:  Results for orders placed or performed during the hospital encounter of 06/12/21 (from the past 48 hour(s))  Glucose, capillary     Status: Abnormal   Collection Time: 06/16/21  7:56 PM  Result Value Ref Range   Glucose-Capillary 102 (H) 70 - 99 mg/dL    Comment: Glucose reference range applies only to samples taken after fasting for at least 8 hours.  Glucose, capillary     Status: Abnormal   Collection Time: 06/17/21 12:04 AM  Result Value Ref Range   Glucose-Capillary 122 (H) 70 - 99 mg/dL    Comment: Glucose reference range applies only to samples taken after fasting for at least 8  hours.  Glucose, capillary     Status: None   Collection Time: 06/17/21  4:03 AM  Result Value Ref Range   Glucose-Capillary 91 70 - 99 mg/dL    Comment: Glucose reference range applies only to samples taken after fasting for at least 8 hours.  Basic metabolic panel     Status: Abnormal   Collection Time: 06/17/21  4:57 AM  Result Value Ref Range   Sodium 141 135 - 145 mmol/L   Potassium 5.0 3.5 - 5.1 mmol/L   Chloride 114 (H) 98 - 111 mmol/L   CO2 21 (L) 22 - 32 mmol/L   Glucose, Bld 108 (H) 70 - 99 mg/dL    Comment: Glucose reference range applies only  to samples taken after fasting for at least 8 hours.   BUN 35 (H) 6 - 20 mg/dL   Creatinine, Ser 1.85 (H) 0.61 - 1.24 mg/dL   Calcium 9.4 8.9 - 63.1 mg/dL   GFR, Estimated 34 (L) >60 mL/min    Comment: (NOTE) Calculated using the CKD-EPI Creatinine Equation (2021)    Anion gap 6 5 - 15    Comment: Performed at [facility], [address] [place], [place] [address]  CBC     Status: Abnormal   Collection Time: 06/17/21  4:57 AM  Result Value Ref Range   WBC 6.7 4.0 - 10.5 K/uL   RBC 3.99 (L) 4.22 - 5.81 MIL/uL   Hemoglobin 11.3 (L) 13.0 - 17.0 g/dL   HCT 63.7 (L) 85.8 - 85.0 %   MCV 86.7 80.0 - 100.0 fL   MCH 28.3 26.0 - 34.0 pg   MCHC 32.7 30.0 - 36.0 g/dL   RDW 27.7 41.2 - 87.8 %   Platelets 354 150 - 400 K/uL   nRBC 0.0 0.0 - 0.2 %    Comment: Performed at [facility], [address], [place], [place] [address]  Magnesium     Status: None   Collection Time: 06/17/21  4:57 AM  Result Value Ref Range   Magnesium 1.7 1.7 - 2.4 mg/dL    Comment: Performed at [facility], [address] [place], [place] [address]  Glucose, capillary     Status: Abnormal   Collection Time: 06/17/21  7:29 AM  Result Value Ref Range   Glucose-Capillary 104 (H) 70 - 99 mg/dL    Comment: Glucose reference range applies only to samples taken after fasting for at least 8 hours.   Comment 1 Notify RN    Comment  2 Document in Chart   Glucose, capillary     Status: Abnormal   Collection Time: 06/17/21 11:51 AM  Result Value Ref Range   Glucose-Capillary 103 (H) 70 - 99 mg/dL    Comment: Glucose reference range applies only to samples taken after fasting for at least 8 hours.   Comment 1 Notify RN    Comment 2 Document in Chart   Glucose, capillary     Status: None   Collection Time: 06/17/21  4:39 PM  Result Value Ref Range   Glucose-Capillary 97 70 - 99 mg/dL    Comment: Glucose reference range applies only to samples taken after fasting for at least 8 hours.   Comment 1 Notify RN    Comment 2 Document in Chart   Glucose, capillary     Status: Abnormal   Collection Time: 06/17/21  8:13 PM  Result Value Ref Range   Glucose-Capillary 125 (H) 70 - 99 mg/dL    Comment: Glucose reference range applies only to samples taken after fasting for at least 8 hours.  Glucose, capillary     Status: Abnormal   Collection Time: 06/18/21 12:02 AM  Result Value Ref Range   Glucose-Capillary 120 (H) 70 - 99 mg/dL    Comment: Glucose reference range applies only to samples taken after fasting for at least 8 hours.  Glucose, capillary     Status: Abnormal   Collection Time: 06/18/21  3:58 AM  Result Value Ref Range   Glucose-Capillary 102 (H) 70 - 99 mg/dL    Comment: Glucose reference range applies only to samples taken after fasting for at least 8 hours.  Basic metabolic panel     Status: Abnormal  Collection Time: 06/18/21  5:01 AM  Result Value Ref Range   Sodium 139 135 - 145 mmol/L   Potassium 5.1 3.5 - 5.1 mmol/L   Chloride 111 98 - 111 mmol/L   CO2 21 (L) 22 - 32 mmol/L   Glucose, Bld 98 70 - 99 mg/dL    Comment: Glucose reference range applies only to samples taken after fasting for at least 8 hours.   BUN 30 (H) 6 - 20 mg/dL   Creatinine, Ser 4.09 (H) 0.61 - 1.24 mg/dL   Calcium 9.5 8.9 - 81.1 mg/dL   GFR, Estimated 39 (L) >60 mL/min    Comment: (NOTE) Calculated using the CKD-EPI  Creatinine Equation (2021)    Anion gap 7 5 - 15    Comment: Performed at [facility], [address] [place], [place] [address]  CBC     Status: Abnormal   Collection Time: 06/18/21  5:01 AM  Result Value Ref Range   WBC 6.3 4.0 - 10.5 K/uL   RBC 4.28 4.22 - 5.81 MIL/uL   Hemoglobin 12.0 (L) 13.0 - 17.0 g/dL   HCT 29.5 (L) 62.1 - 30.8 %   MCV 87.1 80.0 - 100.0 fL   MCH 28.0 26.0 - 34.0 pg   MCHC 32.2 30.0 - 36.0 g/dL   RDW 65.7 84.6 - 96.2 %   Platelets 356 150 - 400 K/uL   nRBC 0.0 0.0 - 0.2 %    Comment: Performed at [facility], [address], [place], [place] [address]  Magnesium     Status: None   Collection Time: 06/18/21  5:01 AM  Result Value Ref Range   Magnesium 1.8 1.7 - 2.4 mg/dL    Comment: Performed at [facility], [address] [place], [place] [address]  Glucose, capillary     Status: Abnormal   Collection Time: 06/18/21  7:31 AM  Result Value Ref Range   Glucose-Capillary 100 (H) 70 - 99 mg/dL    Comment: Glucose reference range applies only to samples taken after fasting for at least 8 hours.    Current Facility-Administered Medications  Medication Dose Route Frequency Provider Last Rate Last Admin   0.9 %  sodium chloride infusion  250 mL Intravenous Continuous Swayze, Ava, DO   Stopped at 06/14/21 1439   Chlorhexidine Gluconate Cloth 2 % PADS 6 each  6 each Topical Daily Swayze, Ava, DO   6 each at 06/17/21 0821   docusate sodium (COLACE) capsule 100 mg  100 mg Oral BID PRN Swayze, Ava, DO       doxepin (SINEQUAN) capsule 100 mg  100 mg Oral QHS Swayze, Ava, DO   100 mg at 06/17/21 2236   FLUoxetine (PROZAC) capsule 60 mg  60 mg Oral Daily Swayze, Ava, DO   60 mg at 06/18/21 0916   folic acid (FOLVITE) tablet 1 mg  1 mg Oral Daily Swayze, Ava, DO   1 mg at 06/18/21 0915   heparin injection 5,000 Units  5,000 Units Subcutaneous Q8H Swayze, Ava, DO   5,000 Units at 06/18/21 0605   iohexol (OMNIPAQUE) 300 MG/ML solution 100  mL  100 mL Intravenous Once PRN Swayze, Ava, DO       metoprolol succinate (TOPROL-XL) 24 hr tablet 50 mg  50 mg Oral Daily Darlin Priestly, MD   50 mg at 06/18/21 0915   multivitamin with minerals tablet 1 tablet  1 tablet Oral Daily Swayze, Ava, DO   1 tablet at  06/18/21 0915   OLANZapine (ZYPREXA) tablet 10 mg  10 mg Oral QHS Swayze, Ava, DO   10 mg at 06/17/21 2236   OLANZapine (ZYPREXA) tablet 5 mg  5 mg Oral Daily Swayze, Ava, DO   5 mg at 06/18/21 0915   oxyCODONE-acetaminophen (PERCOCET/ROXICET) 5-325 MG per tablet 1-2 tablet  1-2 tablet Oral Q4H PRN Darlin Priestly, MD   2 tablet at 06/17/21 2156   pantoprazole (PROTONIX) EC tablet 40 mg  40 mg Oral QHS Swayze, Ava, DO   40 mg at 06/17/21 2014   polyethylene glycol (MIRALAX / GLYCOLAX) packet 17 g  17 g Oral Daily PRN Swayze, Ava, DO       tamsulosin (FLOMAX) capsule 0.4 mg  0.4 mg Oral QPC supper Swayze, Ava, DO   0.4 mg at 06/17/21 1650   thiamine tablet 100 mg  100 mg Oral Daily Swayze, Ava, DO   100 mg at 06/18/21 0915   traZODone (DESYREL) tablet 25 mg  25 mg Oral TID PRN Swayze, Ava, DO        Musculoskeletal: Strength & Muscle Tone: within normal limits Gait & Station: normal Patient leans: N/A Psychiatric Specialty Exam: Physical Exam Vitals and nursing note reviewed.  Constitutional:      Appearance: Normal appearance.  HENT:     Head: Normocephalic.     Nose: Nose normal.  Pulmonary:     Effort: Pulmonary effort is normal.  Musculoskeletal:        General: Normal range of motion.     Cervical back: Normal range of motion.  Neurological:     General: No focal deficit present.     Mental Status: He is alert and oriented to person, place, and time.  Psychiatric:        Attention and Perception: Attention and perception normal.        Mood and Affect: Mood is anxious and depressed.        Speech: Speech normal.        Behavior: Behavior normal. Behavior is cooperative.        Thought Content: Thought content normal.         Cognition and Memory: Cognition and memory normal.        Judgment: Judgment normal.     Review of Systems  Gastrointestinal:  Positive for abdominal pain.  Psychiatric/Behavioral:  Positive for depression and suicidal ideas. The patient is nervous/anxious.   All other systems reviewed and are negative.   Blood pressure (!) 152/103, pulse 100, temperature 97.7 F (36.5 C), resp. rate 18, height 5\' 9"  (1.753 m), weight 82 kg, SpO2 97 %.Body mass index is 26.7 kg/m.  General Appearance: Casual  Eye Contact:  Good  Speech:  Normal Rate  Volume:  Normal  Mood:  Anxious and Depressed  Affect:  Congruent  Thought Process:  Coherent and Descriptions of Associations: Intact  Orientation:  Full (Time, Place, and Person)  Thought Content:  WDL and Logical  Suicidal Thoughts:  Yes with intent and plan  Homicidal Thoughts:  No  Memory:  Immediate;   Good Recent;   Good Remote;   Good  Judgement:  Fair  Insight:  Good  Psychomotor Activity:  Normal  Concentration:  Concentration: Good and Attention Span: Good  Recall:  Good  Fund of Knowledge:  Good  Language:  Good  Akathisia:  No  Handed:  Right  AIMS (if indicated):     Assets:  Leisure Time Physical Health Resilience Social Support  ADL's:  Intact  Cognition:  WNL  Sleep:        Physical Exam: Physical Exam Vitals and nursing note reviewed.  Constitutional:      Appearance: Normal appearance.  HENT:     Head: Normocephalic.     Nose: Nose normal.  Pulmonary:     Effort: Pulmonary effort is normal.  Musculoskeletal:        General: Normal range of motion.     Cervical back: Normal range of motion.  Neurological:     General: No focal deficit present.     Mental Status: He is alert and oriented to person, place, and time.  Psychiatric:        Attention and Perception: Attention and perception normal.        Mood and Affect: Mood is anxious and depressed.        Speech: Speech normal.        Behavior: Behavior  normal. Behavior is cooperative.        Thought Content: Thought content normal.        Cognition and Memory: Cognition and memory normal.        Judgment: Judgment normal.    Review of Systems  Gastrointestinal:  Positive for abdominal pain.  Psychiatric/Behavioral:  Positive for depression and suicidal ideas. The patient is nervous/anxious.   All other systems reviewed and are negative.  Blood pressure (!) 152/103, pulse 100, temperature 97.7 F (36.5 C), resp. rate 18, height  (1.753 m), weight 82 kg, SpO2 97 %. Body mass index is 26.7 kg/m.  Treatment Plan Summary: Major depressive disorder, recurrent,mild to moderate Continue Prozac 60 mg daily Continue Zyprexa 5 mg in the am and 10 mg in the pm  Insomnia: Continue Doxepin 100 mg at bedtime Continue Trazodone 25 mg at bedtime PRN  Disposition:  Psychiatric admission needed  Nanine Means, NP 06/18/2021 10:26 AM

## 2021-06-18 NOTE — Tx Team (Signed)
Initial Treatment Plan 06/18/2021 11:10 PM [name] [id number]    PATIENT STRESSORS: Financial difficulties   Legal issue     PATIENT STRENGTHS: Capable of independent living  Communication skills    PATIENT IDENTIFIED PROBLEMS: Homeless  Suicidal ideations  Legal charges                 DISCHARGE CRITERIA:  Adequate post-discharge living arrangements Improved stabilization in mood, thinking, and/or behavior  PRELIMINARY DISCHARGE PLAN: Outpatient therapy Placement in alternative living arrangements  PATIENT/FAMILY INVOLVEMENT: This treatment plan has been presented to and reviewed with the patient, [name]. The patient have been given the opportunity to ask questions and make suggestions.  Keidra Withers, RN 06/18/2021, 11:10 PM

## 2021-06-18 NOTE — Discharge Summary (Signed)
Physician Discharge Summary   [name]  male DOB: 1960/07/25  [id number]  PCP: Ranae Plumber, PA  Admit date: 06/12/2021 Discharge date: 06/18/2021  Admitted From: motel Disposition:  inpatient psych CODE STATUS: Full code  Hospital Course:  For full details, please see H&P, progress notes, consult notes and ancillary notes.  Briefly,  [name] is a 61 yo M presenting to [facility] ED with multiple complaints, from a local motel where he was staying.  Per ED documentation, patient called EMS because of severe abdominal pain over the last several days as well as nausea and vomiting.  He also reported to EMS that wanted to kill himself.  Also per ED documentation he reported dropping his psychiatric medicine on the floor a few days ago at the motel and was not able to collect it again so he has not been taking his medicine for the last couple of days.  Patient initially told the EDP and nursing that he had fallen and had abdominal pain while playing basketball.  Later patient complained of aching severe pain in bilateral hips, making it difficult to lift his legs off the bed or walk.  He reports having to "walk a lot lately" because he is homeless.  He also stated his dog has been staying at the motel and has been urinating on the floor which he slipped in fell.  However, earlier he also stated he fell in the bathroom and hit his head on the tub.  He is complaining of feeling like he has " whiplash".  And that his right arm neck and head are also hurting.    Of note the patient was admitted little over 2 weeks ago for cellulitis of the hand and a knee effusion, subsequently developing acute appendicitis undergoing an appendectomy approximately 9 days ago with a minor umbilical hernia repair by Dr. Christian Mate.  He was also expressing suicidal ideation and has a history of bipolar disorder.    Upon arrival he told EDP that he was frustrated because he has been hurting so long and  that he definitely does not want to kill himself, just wants to stop hurting.  Due to concern for postoperative complication CT abdomen pelvis performed showing moderate severity colitis and mild to moderate severity bibasilar atelectasis with a distended gallbladder without evidence of cholelithiasis.  Lab work revealed acute renal failure.  Sepsis protocol initiated.  While in ED after IV fluid resuscitation patient became hypotensive requiring peripheral vasopressor support.  Upon ICU assessment, patient with new complaints of leg weakness and neck pain with possible fall described.  EDP ordered CT head and CT cervical spine which were unrevealing.    Bipolar Disorder Anxiety  Major depressive disorder, recurrent, mild to moderate PTSD SI --expressed SI morning of 6/9 when pt was ready for discharge home. --psych consulted, rec inpatient psych admission - cont prozac, zyprexa and trazodone  - cont doxepin    Suspected Sepsis with shock  Resolved.  --tachycardia, leukocytosis, hypotensive needing pressors   Colitis:  Likely source of sepsis.  --received ceftriaxone/flagyl, then vanc/cefepime, then back to ceftriaxone, then switched to augmentin.  Completed abx course.   Rhabdomyolysis --presumed due to falls.  3464 on presentation, trended down to 606.   Acute Kidney Injury  in the setting of suspected Sepsis and Rhabdomyolysis Baseline Cr: 0.93, Cr on admission: 7.18.  Cr improved with IVF, down to 1.94 prior to discharge.  Expect to continue to improve.   Hyperkalemia, resolved  Metabolic acidosis, improved --due to severe AKI   BLE & RUE weakness - MRI brain demonstrated no acute abnormality -MRI C T & L-spine showed Findings favored to be related to be secondary to sterile/degenerative facet arthritis.   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 The score is based on dignosis, age, lab data, medications, orders, and past utilization.   Low:  0-14.9   Medium: 15-21.9   High: 22-29.9   Extreme: 30 and above         Discharge Instructions:  Allergies as of 06/18/2021   No Known Allergies      Medication List     TAKE these medications    doxepin 100 MG capsule Commonly known as: SINEQUAN Take 1 capsule (100 mg total) by mouth at bedtime.   enalapril 10 MG tablet Commonly known as: VASOTEC Hold until followup with PCP due to your acute kidney injury. What changed:  how much to take how to take this when to take this additional instructions   FLUoxetine 20 MG capsule Commonly known as: PROZAC Take 3 capsules (60 mg total) by mouth daily.   folic acid 1 MG tablet Commonly known as: FOLVITE Take 1 tablet (1 mg total) by mouth daily.   gabapentin 400 MG capsule Commonly known as: NEURONTIN Take 1 capsule (400 mg total) by mouth 3 (three) times daily.   metoprolol succinate 50 MG 24 hr tablet Commonly known as: TOPROL-XL Take 1 tablet (50 mg total) by mouth daily. Take with or immediately following a meal.   multivitamin with minerals Tabs tablet Take 1 tablet by mouth daily.   naltrexone 50 MG tablet Commonly known as: DEPADE Take 50 mg by mouth daily.   naproxen 500 MG  tablet Commonly known as: Naprosyn Take 1 tablet (500 mg total) by mouth 2 (two) times daily as needed. Home med. What changed:  when to take this reasons to take this additional instructions   OLANZapine 5 MG tablet Commonly known as: ZYPREXA Take 1 tablet (5 mg total) by mouth daily.   OLANZapine 10 MG tablet Commonly known as: ZYPREXA Take 1 tablet (10 mg total) by mouth at bedtime.   pantoprazole 40 MG tablet Commonly known as: Protonix Take 1 tablet (40 mg total) by mouth daily.   thiamine 100 MG tablet Take 1 tablet (100 mg total) by mouth daily.   traZODone 50 MG tablet Commonly known as: DESYREL Take 0.5 tablets (25 mg total) by mouth 3 (three) times daily as needed (Anxiety).         Follow-up Information     Ranae Plumber, [place] Follow up.   Specialty: Family Medicine Contact information: [place] [address] [phone]  No Known Allergies   The results of significant diagnostics from this hospitalization (including imaging, microbiology, ancillary and laboratory) are listed below for reference.   Consultations:   Procedures/Studies: US RENAL  Result Date: 06/16/2021 CLINICAL DATA:  Renal insufficiency. EXAM: RENAL / URINARY TRACT ULTRASOUND COMPLETE COMPARISON:  CT abdomen pelvis dated June 13, 2021. FINDINGS: Right Kidney: Renal measurements: 11.1 x 6.0 x 5.0 cm = volume: 172 mL. Echogenicity within normal limits. No mass or hydronephrosis visualized. Small indeterminate lesion seen on prior CTs is not visualized by ultrasound. Left Kidney: Renal measurements: 12.2 x 5.7 x 4.9 cm = volume: 179 mL. Echogenicity within normal limits. No mass or hydronephrosis visualized. Lower pole simple cysts measuring up to 1.7 cm. Bladder: Partially decompressed by a Foley catheter. Other: None. IMPRESSION: 1. No acute abnormality. 2. Small indeterminate right renal lesion seen on prior CTS is not visualized by ultrasound.  Electronically Signed   By: Obie Dredge M.D.   On: 06/16/2021 11:57   MR LUMBAR SPINE WO CONTRAST  Result Date: 06/13/2021 CLINICAL DATA:  Initial evaluation for acute low back pain, infection suspected. EXAM: MRI LUMBAR SPINE WITHOUT CONTRAST TECHNIQUE: Multiplanar, multisequence MR imaging of the lumbar spine was performed. No intravenous contrast was administered. COMPARISON:  Prior CT from 06/02/2021. FINDINGS: Segmentation: Standard. Lowest well-formed disc space labeled the L5-S1 level. Alignment: Trace anterolisthesis of L5 on S1, likely chronic and facet mediated. Alignment otherwise normal preservation of the normal lumbar lordosis. Vertebrae: Vertebral body height maintained without acute or chronic fracture. Bone marrow signal intensity within normal limits. No discrete or worrisome osseous lesions. Mild reactive marrow edema with small joint effusion seen about the right L4-5 and L5-S1 facets (series 3, image 4), favored to be due to facet arthritis. No other evidence for osteomyelitis discitis or other infection. Conus medullaris and cauda equina: Conus extends to the T12-L1 level. Conus and cauda equina appear normal. Paraspinal and other soft tissues: Mild edema within the lower posterior paraspinous soft tissues adjacent to the L4-5 facets, likely reactive. No discrete collections. Note made of a 1.9 cm T2 hypointense lesion at the posterior right kidney (series 4, image 12), indeterminate, but also seen on prior CT from 06/02/2021. Few additional small benign appearing cyst noted within the contralateral left kidney. Disc levels: L1-2: Disc desiccation without disc bulge. No canal or foraminal stenosis. L2-3: Disc desiccation without disc bulge. Mild left greater than right facet hypertrophy. No canal or foraminal stenosis. L3-4: Mild disc desiccation without disc bulge. Mild bilateral facet hypertrophy. No canal or foraminal stenosis. L4-5: Disc desiccation with mild disc bulge.  Mild-to-moderate bilateral facet hypertrophy with associated small joint effusions, greater on the right. Resultant mild bilateral lateral recess stenosis, left worse than right. Central canal remains patent. No significant foraminal encroachment. L5-S1: Trace anterolisthesis. Shallow broad-based central disc protrusion with annular fissure. Bilateral facet hypertrophy. No significant canal or lateral recess stenosis. Foramina remain patent. IMPRESSION: 1. Mild reactive marrow edema with small joint effusions about the right greater than left L4-5 and L5-S1 facets. Associated mild reactive edema within the adjacent posterior paraspinous soft tissues. Findings are favored to be related to be secondary to sterile/degenerative facet arthritis. Although felt to be unlikely, if there is continued clinical concern for persistent and/or evolving infection, a short interval follow-up study to evaluate for interval change would be suggested for further evaluation. 2. Mild degenerative disc disease at L4-5 and L5-S1 with resultant mild bilateral subarticular stenosis at L4-5. 3. 1.9 cm T2 hypointense lesion  at the posterior right kidney, indeterminate, but also seen on recent abdominal CT from 06/02/2021. Please refer to that previous exam for follow-up recommendations regarding this finding. Electronically Signed   By: Jeannine Boga M.D.   On: 06/13/2021 23:53   MR BRAIN WO CONTRAST  Result Date: 06/13/2021 CLINICAL DATA:  Initial evaluation for head trauma, focal neuro findings, mental status change. EXAM: MRI HEAD WITHOUT CONTRAST TECHNIQUE: Multiplanar, multiecho pulse sequences of the brain and surrounding structures were obtained without intravenous contrast. COMPARISON:  Head CT from earlier the same day. FINDINGS: Brain: Cerebral volume within normal limits. Small remote lacunar infarcts present at the right basal ganglia and left ventral pons. No acute intracranial hemorrhage. No acute large vessel  territory infarct. Gray-white matter differentiation maintained. No other areas of chronic cortical infarction. No acute or chronic intracranial blood products. No mass lesion, midline shift or mass effect. No hydrocephalus or extra-axial fluid collection. Pituitary gland suprasellar region within normal limits. Vascular: Major intracranial vascular flow voids are maintained. Skull and upper cervical spine: Craniocervical junction within normal limits. Bone marrow signal intensity normal. No scalp soft tissue abnormality. Sinuses/Orbits: Globes and orbital soft tissues within normal limits. Scattered mucosal thickening noted throughout the frontoethmoidal and maxillary sinuses. Trace left mastoid effusion, of doubtful significance. Other: None. IMPRESSION: 1. No acute intracranial abnormality. 2. Small remote lacunar infarcts involving the right basal ganglia and left ventral pons. Electronically Signed   By: Jeannine Boga M.D.   On: 06/13/2021 23:35   MR THORACIC SPINE WO CONTRAST  Result Date: 06/13/2021 CLINICAL DATA:  Mid-back pain, infection suspected, no prior imaging variety of neurological symptoms in arms and legs, possible trauma history, septic shock EXAM: MRI THORACIC SPINE WITHOUT CONTRAST TECHNIQUE: Multiplanar, multisequence MR imaging of the thoracic spine was performed. No intravenous contrast was administered. COMPARISON:  CT 04/12/2020 FINDINGS: Technical Note: Despite efforts by the technologist and patient, motion artifact is present on today's exam and could not be eliminated. This reduces exam sensitivity and specificity. Alignment:  Physiologic. Vertebrae: No fracture, evidence of discitis, or bone lesion. Cord: No evidence of focal cord abnormality within the limitations of this exam. Paraspinal and other soft tissues: Dependent bibasilar opacities with trace amount of pleural fluid bilaterally. Disc levels: Thoracic intervertebral disc heights are preserved. Shallow noncompressive  disc protrusions at the T5-T6 and T10-T11 levels. No foraminal or canal stenosis at any level within the thoracic spine. IMPRESSION: 1. No evidence of infection or discitis-osteomyelitis of the thoracic spine. 2. Minimal degenerative disc disease. No foraminal or canal stenosis at any level within the thoracic spine. 3. Dependent bibasilar opacities with trace amount of pleural fluid bilaterally. Electronically Signed   By: Davina Poke D.O.   On: 06/13/2021 17:40   MR Cervical Spine Wo Contrast  Result Date: 06/13/2021 CLINICAL DATA:  Neck pain, infection suspected, no prior imaging variety of neurological symptoms in arms and legs, possible trauma history, septic shock EXAM: MRI CERVICAL SPINE WITHOUT CONTRAST TECHNIQUE: Multiplanar, multisequence MR imaging of the cervical spine was performed. No intravenous contrast was administered. COMPARISON:  CT 06/13/2021 FINDINGS: Technical Note: Despite efforts by the technologist and patient, motion artifact is present on today's exam and could not be eliminated. This reduces exam sensitivity and specificity. Alignment: Straightening of the cervical lordosis with minimal grade 1 anterolisthesis of C2 on C3. Vertebrae: No fracture, evidence of discitis, or bone lesion. Cord: Suboptimally assessed given degree of motion degradation. No obvious signal abnormality. No apparent epidural space abnormality. Posterior Fossa,  vertebral arteries, paraspinal tissues: No acute findings. Disc levels: Evaluation of all levels is limited by motion. C2-C3: Right paracentral disc osteophyte complex with mild-moderate right foraminal stenosis. Mild canal stenosis. C3-C4: No evidence of significant foraminal or canal stenosis. C4-C5: Disc osteophyte complex with bilateral uncovertebral spurring. Suspect mild canal stenosis. Moderate right and mild left foraminal stenosis. C5-C6: No evidence of significant foraminal or canal stenosis. C6-C7: No evidence of significant foraminal or  canal stenosis. C7-T1: No evidence of significant foraminal or canal stenosis. IMPRESSION: 1. Significantly motion degraded exam. 2. Within this limitation, there is no evidence of discitis or osteomyelitis of the cervical spine. 3. Cervical spondylosis most pronounced at the C4-5 level where there is probable mild canal stenosis, moderate right and mild left foraminal stenosis. 4. At C2-3, there is a right paracentral disc osteophyte complex with mild-moderate right foraminal stenosis and mild canal stenosis. Electronically Signed   By: Davina Poke D.O.   On: 06/13/2021 17:33   DG Chest Port 1 View  Result Date: 06/13/2021 CLINICAL DATA:  61 year old male with history of septic shock. EXAM: PORTABLE CHEST 1 VIEW COMPARISON:  Chest x-ray 04/12/2020. FINDINGS: Mild linear scarring in the right mid lung, unchanged. Lung volumes are normal. No consolidative airspace disease. No pleural effusions. No pneumothorax. No pulmonary nodule or mass noted. Pulmonary vasculature and the cardiomediastinal silhouette are within normal limits. IMPRESSION: No radiographic evidence of acute cardiopulmonary disease. Electronically Signed   By: Vinnie Langton M.D.   On: 06/13/2021 05:47   CT Head Wo Contrast  Result Date: 06/13/2021 CLINICAL DATA:  Trauma EXAM: CT HEAD WITHOUT CONTRAST CT CERVICAL SPINE WITHOUT CONTRAST TECHNIQUE: Multidetector CT imaging of the head and cervical spine was performed following the standard protocol without intravenous contrast. Multiplanar CT image reconstructions of the cervical spine were also generated. RADIATION DOSE REDUCTION: This exam was performed according to the departmental dose-optimization program which includes automated exposure control, adjustment of the mA and/or kV according to patient size and/or use of iterative reconstruction technique. COMPARISON:  None Available. FINDINGS: CT HEAD FINDINGS Brain: No acute territorial infarction, hemorrhage or intracranial mass. The  ventricles are nonenlarged. Vascular: No hyperdense vessels. No unexpected calcification. Mild carotid vascular calcification Skull: Normal. Negative for fracture or focal lesion. Sinuses/Orbits: Mild mucosal thickening of the sinuses Other: None CT CERVICAL SPINE FINDINGS Alignment: Mild reversal of cervical lordosis. No subluxation. Facet alignment within normal limits Skull base and vertebrae: No acute fracture. No primary bone lesion or focal pathologic process. Soft tissues and spinal canal: No prevertebral fluid or swelling. No visible canal hematoma. Disc levels: Moderate disc space narrowing and degenerative change C4-C5, C5-C6 and C6-C7. Facet degenerative changes at multiple levels with foraminal narrowing Upper chest: Negative. Other: None IMPRESSION: 1. Negative non contrasted CT appearance of the brain. 2. Mild reversal of cervical lordosis with degenerative change. No acute osseous abnormality Electronically Signed   By: Donavan Foil M.D.   On: 06/13/2021 03:48   CT Cervical Spine Wo Contrast  Result Date: 06/13/2021 CLINICAL DATA:  Trauma EXAM: CT HEAD WITHOUT CONTRAST CT CERVICAL SPINE WITHOUT CONTRAST TECHNIQUE: Multidetector CT imaging of the head and cervical spine was performed following the standard protocol without intravenous contrast. Multiplanar CT image reconstructions of the cervical spine were also generated. RADIATION DOSE REDUCTION: This exam was performed according to the departmental dose-optimization program which includes automated exposure control, adjustment of the mA and/or kV according to patient size and/or use of iterative reconstruction technique. COMPARISON:  None Available. FINDINGS:  CT HEAD FINDINGS Brain: No acute territorial infarction, hemorrhage or intracranial mass. The ventricles are nonenlarged. Vascular: No hyperdense vessels. No unexpected calcification. Mild carotid vascular calcification Skull: Normal. Negative for fracture or focal lesion. Sinuses/Orbits:  Mild mucosal thickening of the sinuses Other: None CT CERVICAL SPINE FINDINGS Alignment: Mild reversal of cervical lordosis. No subluxation. Facet alignment within normal limits Skull base and vertebrae: No acute fracture. No primary bone lesion or focal pathologic process. Soft tissues and spinal canal: No prevertebral fluid or swelling. No visible canal hematoma. Disc levels: Moderate disc space narrowing and degenerative change C4-C5, C5-C6 and C6-C7. Facet degenerative changes at multiple levels with foraminal narrowing Upper chest: Negative. Other: None IMPRESSION: 1. Negative non contrasted CT appearance of the brain. 2. Mild reversal of cervical lordosis with degenerative change. No acute osseous abnormality Electronically Signed   By: Donavan Foil M.D.   On: 06/13/2021 03:48   CT ABDOMEN PELVIS WO CONTRAST  Result Date: 06/13/2021 CLINICAL DATA:  History of laparoscopic appendectomy on Jun 02, 2021, presenting with diffuse abdominal pain. EXAM: CT ABDOMEN AND PELVIS WITHOUT CONTRAST TECHNIQUE: Multidetector CT imaging of the abdomen and pelvis was performed following the standard protocol without IV contrast. RADIATION DOSE REDUCTION: This exam was performed according to the departmental dose-optimization program which includes automated exposure control, adjustment of the mA and/or kV according to patient size and/or use of iterative reconstruction technique. COMPARISON:  Jun 02, 2021 FINDINGS: Lower chest: Mild to moderate severity atelectasis is seen along the posterior aspects of the bilateral lung bases. Hepatobiliary: No focal liver abnormality is seen. The gallbladder is markedly distended. No gallstones, gallbladder wall thickening, or biliary dilatation. Pancreas: Unremarkable. No pancreatic ductal dilatation or surrounding inflammatory changes. Spleen: Normal in size without focal abnormality. Adrenals/Urinary Tract: Adrenal glands are unremarkable. Kidneys are normal in size, without renal  calculi or hydronephrosis. Stable cystic appearing areas are seen within the lower pole of the left kidney, with a stable, slightly exophytic area of low attenuation seen along the posteromedial aspect of the mid right kidney. These areas are markedly limited in evaluation in the absence of intravenous contrast. The urinary bladder is poorly distended and subsequently limited in evaluation. Stomach/Bowel: Stomach is within normal limits. The appendix is surgically absent. No evidence of bowel dilatation. There is moderate severity thickening of the ascending colon and proximal transverse colon are poorly distended. Stool is seen throughout the remainder of the transverse colon and throughout the length of the descending colon. Vascular/Lymphatic: Aortic atherosclerosis. No enlarged abdominal or pelvic lymph nodes. Reproductive: Prostate is unremarkable. Other: No abdominal wall hernia or abnormality. No abdominopelvic ascites. Musculoskeletal: No acute or significant osseous findings. IMPRESSION: 1. Findings suspicious for moderate severity colitis involving the ascending colon and proximal transverse colon. 2. Stable renal lesions, as described above. Correlation with follow-up renal ultrasound in 6 months is again recommended. 3. Mild to moderate severity bibasilar atelectasis. 4. Distended gallbladder without evidence of cholelithiasis. Aortic Atherosclerosis (ICD10-I70.0). Electronically Signed   By: Virgina Norfolk M.D.   On: 06/13/2021 02:26   CT ABDOMEN PELVIS W CONTRAST  Result Date: 06/02/2021 CLINICAL DATA:  Right lower quadrant abdominal pain. EXAM: CT ABDOMEN AND PELVIS WITH CONTRAST TECHNIQUE: Multidetector CT imaging of the abdomen and pelvis was performed using the standard protocol following bolus administration of intravenous contrast. RADIATION DOSE REDUCTION: This exam was performed according to the departmental dose-optimization program which includes automated exposure control, adjustment  of the mA and/or kV according to patient size and/or use  of iterative reconstruction technique. CONTRAST:  177mL OMNIPAQUE IOHEXOL 300 MG/ML  SOLN COMPARISON:  CT abdomen and pelvis 04/06/2020 and 01/27/2016; MRI abdomen 04/07/2020 FINDINGS: Lower chest: Mild motion artifact limits evaluation of the lung bases. No gross abnormality. Hepatobiliary: Smooth liver contours. Mildly decreased density throughout the liver suggesting fatty infiltration. Tiny low-density focus adjacent to the gallbladder fossa unchanged and possibly a tiny cyst. The gallbladder is unremarkable. Pancreas: Unremarkable. No pancreatic ductal dilatation or surrounding inflammatory changes. Spleen: Normal in size without focal abnormality. Adrenals/Urinary Tract: Normal adrenals. The kidneys enhance uniformly and are symmetrically chicken size without hydronephrosis. There multiple small low-attenuation lesions within the bilateral kidneys. Stable 13 mm low-attenuation lesion within the posterior inferior right kidney (axial series 2, image 41) demonstrates internal density of 28 Hounsfield units. Again this may represent a solid mass, and renal cell carcinoma cannot be excluded. This is again increased in size from approximately 8 mm on 01/27/2016 CT. Left lower pole 1.4 cm (axial image 42) fluid attenuation cyst is slightly increased from 11 mm previously. Just anterior to this, there is a 12 mm low-attenuation lesion with internal density of 14 Hounsfield units, previously 11 mm, compatible with an additional cyst. No focal urinary bladder wall thickening is seen. Stomach/Bowel: There is high-grade stool seen throughout all portions of the colon increased from prior and suggesting constipation. The terminal ileum is unremarkable. The appendix is enlarged measuring up to 15 mm in caliber, and demonstrates mild-to-moderate surrounding inflammatory fat stranding and wall thickening. There are 2 mid to distal appendiceal appendicoliths, with the  larger more proximal appendicolith measuring up to 7 mm (axial series 2 images 57 through 63). No dilated loops of bowel to indicate bowel obstruction. Vascular/Lymphatic: Mild atherosclerotic calcifications. No abdominal aortic aneurysm. The major intra-abdominal aortic branch vessels are patent. Reproductive: The prostate and seminal vesicles are unremarkable. Other: There is a fat containing umbilical hernia measuring up to 3.0 cm in transverse dimension similar to prior. No free air or free fluid is seen within the abdomen or pelvis. Musculoskeletal: No acute skeletal abnormality. IMPRESSION: 1. Acute appendicitis.  No perforation or abscess is seen. 2. Within the posterior lower pole of the right kidney there is a 13 mm low-attenuation lesion of the is not qualify as a simple cyst. This is stable in size from 04/06/2020 CT but increased in size from 01/27/2016 CT. This remains indeterminate for a complex cyst versus solid mass on 04/07/2020 MRI. The stability over the past 13 months is reassuring for a benign process. Consider follow-up ultrasound in 6 months for surveillance. 3. High-grade stool throughout all portions of the colon suggesting constipation. Electronically Signed   By: Yvonne Kendall M.D.   On: 06/02/2021 15:18   US Venous Img Lower Unilateral Left  Result Date: 05/29/2021 CLINICAL DATA:  Swelling and pain for 2 weeks EXAM: LEFT LOWER EXTREMITY VENOUS DOPPLER ULTRASOUND TECHNIQUE: Gray-scale sonography with compression, as well as color and duplex ultrasound, were performed to evaluate the deep venous system(s) from the level of the common femoral vein through the popliteal and proximal calf veins. COMPARISON:  None FINDINGS: VENOUS Normal compressibility of the common femoral, superficial femoral, and popliteal veins, as well as the visualized calf veins. Visualized portions of profunda femoral vein and great saphenous vein unremarkable. No filling defects to suggest DVT on grayscale or  color Doppler imaging. Doppler waveforms show normal direction of venous flow, normal respiratory plasticity and response to augmentation. Limited views of the contralateral common femoral vein are  unremarkable. OTHER None. Limitations: none IMPRESSION: 1. No left lower extremity DVT 2. Nonspecific mildly enlarged left inguinal lymph node. Follow-up ultrasound should be performed in 4-6 weeks to document resolution. Electronically Signed   By: Miachel Roux M.D.   On: 05/29/2021 18:17   CT HAND RIGHT W CONTRAST  Result Date: 05/29/2021 CLINICAL DATA:  Right hand swelling. Soft tissue infection suspected, hand, xray done EXAM: CT OF THE UPPER RIGHT EXTREMITY WITH CONTRAST TECHNIQUE: Multidetector CT imaging of the upper right extremity was performed according to the standard protocol following intravenous contrast administration. RADIATION DOSE REDUCTION: This exam was performed according to the departmental dose-optimization program which includes automated exposure control, adjustment of the mA and/or kV according to patient size and/or use of iterative reconstruction technique. CONTRAST:  175mL OMNIPAQUE IOHEXOL 300 MG/ML  SOLN COMPARISON:  X-ray 05/28/2021 FINDINGS: Bones/Joint/Cartilage No acute fracture. No dislocation. Mild degenerative changes of the hand, most pronounced at the first MCP joint. No erosion or periosteal elevation. No large joint effusion is evident by CT. Ligaments Suboptimally assessed by CT. Muscles and Tendons Large volume of focal tenosynovitis involving the long finger extensor tendon centered at the third MCP joint measuring up to 3.0 x 0.8 x 1.9 cm (series 3, images 39-52). No additional tenosynovial fluid collections are evident. No intramuscular fluid collection identified. Soft tissues Diffuse soft tissue swelling with ill-defined fluid over the dorsum of the hand. No organized or rim enhancing fluid collection within the soft tissues. IMPRESSION: 1. Large volume of focal  tenosynovitis involving the long finger extensor tendon centered at the third MCP joint measuring up to 3.0 x 0.8 x 1.9 cm. Findings concerning for septic tenosynovitis given the history. 2. Diffuse soft tissue swelling with ill-defined fluid over the dorsum of the hand, suggestive of cellulitis. No organized or rim enhancing fluid collection within the soft tissues. 3. No evidence of osteomyelitis. Electronically Signed   By: Davina Poke D.O.   On: 05/29/2021 17:13   CT KNEE LEFT W CONTRAST  Result Date: 05/29/2021 CLINICAL DATA:  Left knee swelling for 3 days EXAM: CT OF THE LEFT KNEE WITH CONTRAST TECHNIQUE: Multidetector CT imaging was performed following the standard protocol during bolus administration of intravenous contrast. RADIATION DOSE REDUCTION: This exam was performed according to the departmental dose-optimization program which includes automated exposure control, adjustment of the mA and/or kV according to patient size and/or use of iterative reconstruction technique. 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 Metallic streak artifact from hardware slightly degrades evaluation of the adjacent structures. Within this limitation, there is no evidence of periprosthetic fracture or loosening. Remaining osseous structures appear within normal limits. No evidence of erosion or periosteal elevation. Large knee joint effusion with mild diffuse synovial thickening. Ligaments Suboptimally assessed by CT. Muscles and Tendons Musculotendinous structures appear within normal limits by CT. Soft tissues No significant periarticular soft tissue findings. IMPRESSION: 1. Large knee joint effusion with mild diffuse synovial thickening, nonspecific in the postoperative setting. If there is clinical suspicion for infection, arthrocentesis should be performed to assess for  septic arthritis. 2. 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 Electronically Signed   By: Davina Poke D.O.   On: 05/29/2021 17:03   DG Hand Complete Right  Result Date: 05/28/2021 CLINICAL DATA:  Right hand swelling without known injury. EXAM: RIGHT HAND - COMPLETE 3+ VIEW COMPARISON:  None Available. FINDINGS: There is no evidence of fracture or dislocation. There is no evidence of arthropathy or other focal bone abnormality. Dorsal soft tissue swelling is noted. IMPRESSION: No fracture or dislocation is noted. Dorsal soft tissue swelling is noted which may be inflammatory in etiology. Electronically Signed   By: Marijo Conception M.D.   On: 05/28/2021 08:54   DG Knee Complete 4 Views Left  Result Date: 05/27/2021 CLINICAL DATA:  Left knee pain and swelling. EXAM: LEFT KNEE - COMPLETE 4+ VIEW COMPARISON:  January 17, 2021. FINDINGS: Status post left total knee arthroplasty. The femoral, tibial and patellar components are well situated. No fracture or dislocation is noted. IMPRESSION: No acute abnormality seen. Electronically Signed   By: Marijo Conception M.D.   On: 05/27/2021 11:03      Labs: BNP (last 3 results) No results for input(s): "BNP" in the last 8760 hours. Basic Metabolic Panel: Recent Labs  Lab 06/13/21 0007 06/13/21 0056 06/13/21 [id number] 06/13/21 1152 06/14/21 0417 06/15/21 0450 06/16/21 0458 06/17/21 0457 06/18/21 0501  NA 132*   < > 134*   < > 138 140 139 141 139  K 4.9   < > 4.1  4.1   < > 4.2 4.5 4.8 5.0 5.1  CL 95*   < > 101   < > 102 109 111 114* 111  CO2 18*   < > 17*   < > 24 24 21* 21* 21*  GLUCOSE 146*   < > 122*   < > 97 99 99 108* 98  BUN 82*   < > 80*   < > 76* 63* 46* 35* 30*  CREATININE 7.15*   < > 6.33*   < > 5.30* 4.15* 3.06* 2.18* 1.94*  CALCIUM 8.3*   < > 8.1*   < > 8.1* 8.8* 8.8* 9.4 9.5  MG  --    < > 2.1  --  2.1 2.0  --  1.7 1.8  PHOS 9.4*  --  8.5*  --  5.3*  --   --   --    --    < > = values in this interval not displayed.   Liver Function Tests: Recent Labs  Lab 06/13/21 0007  AST 98*  ALT 56*  ALKPHOS 61  BILITOT 5.8*  PROT 7.5  ALBUMIN 3.6   Recent Labs  Lab 06/13/21 0007  LIPASE 30   No results for input(s): "AMMONIA" in the last 168 hours. CBC: Recent Labs  Lab 06/13/21 0007 06/13/21 0628 06/14/21 0417 06/16/21 0458 06/17/21 0457 06/18/21 0501  WBC 20.4* 15.0* 9.4 7.8 6.7 6.3  NEUTROABS 18.0*  --   --   --   --   --   HGB 11.0* 10.2* 10.0* 10.7* 11.3* 12.0*  HCT 33.9* 30.1* 30.2* 32.4* 34.6* 37.3*  MCV 87.4 85.8 85.1 87.1 86.7 87.1  PLT 247 210 244 318 354 356   Cardiac Enzymes: Recent Labs  Lab 06/13/21 0007 06/13/21 0628 06/14/21 0417  CKTOTAL 3,464* 2,164* 606*   BNP: Invalid input(s): "POCBNP" CBG: Recent Labs  Lab 06/17/21 2013 06/18/21 0002 06/18/21 0358 06/18/21 0731 06/18/21 1205  GLUCAP 125* 120* 102* 100* 109*   D-Dimer No results for input(s): "DDIMER" in the last 72 hours. Hgb A1c No results for input(s): "HGBA1C" in the last 72 hours. Lipid Profile No results for input(s): "CHOL", "HDL", "LDLCALC", "TRIG", "CHOLHDL", "LDLDIRECT" in the last 72 hours. Thyroid function studies No results for input(s): "TSH", "T4TOTAL", "T3FREE", "  THYROIDAB" in the last 72 hours.  Invalid input(s): "FREET3" Anemia work up No results for input(s): "VITAMINB12", "FOLATE", "FERRITIN", "TIBC", "IRON", "RETICCTPCT" in the last 72 hours. Urinalysis    Component Value Date/Time   COLORURINE YELLOW (A) 06/13/2021 1341   APPEARANCEUR HAZY (A) 06/13/2021 1341   LABSPEC 1.009 06/13/2021 1341   PHURINE 5.0 06/13/2021 1341   GLUCOSEU NEGATIVE 06/13/2021 1341   HGBUR MODERATE (A) 06/13/2021 1341   BILIRUBINUR NEGATIVE 06/13/2021 1341   KETONESUR NEGATIVE 06/13/2021 1341   PROTEINUR NEGATIVE 06/13/2021 1341   NITRITE NEGATIVE 06/13/2021 1341   LEUKOCYTESUR NEGATIVE 06/13/2021 1341   Sepsis Labs Recent Labs  Lab  06/14/21 0417 06/16/21 0458 06/17/21 0457 06/18/21 0501  WBC 9.4 7.8 6.7 6.3   Microbiology Recent Results (from the past 240 hour(s))  Blood Culture (routine x 2)     Status: None   Collection Time: 06/13/21  1:32 AM   Specimen: BLOOD  Result Value Ref Range Status   Specimen Description BLOOD BLOOD RIGHT WRIST  Final   Special Requests   Final    BOTTLES DRAWN AEROBIC AND ANAEROBIC Blood Culture adequate volume   Culture   Final    NO GROWTH 5 DAYS Performed at [facility], [address], [place], [place] [address]    Report Status 06/18/2021 FINAL  Final  Blood Culture (routine x 2)     Status: None   Collection Time: 06/13/21  1:32 AM   Specimen: BLOOD  Result Value Ref Range Status   Specimen Description BLOOD BLOOD LEFT ARM  Final   Special Requests   Final    BOTTLES DRAWN AEROBIC AND ANAEROBIC Blood Culture adequate volume   Culture   Final    NO GROWTH 5 DAYS Performed at [facility], [address] [place], [place] [address]    Report Status 06/18/2021 FINAL  Final  SARS Coronavirus 2 by RT PCR (hospital order, performed in [facility] hospital lab) *cepheid single result test* Anterior Nasal Swab     Status: None   Collection Time: 06/18/21  5:13 PM   Specimen: Anterior Nasal Swab  Result Value Ref Range Status   SARS Coronavirus 2 by RT PCR NEGATIVE NEGATIVE Final    Comment: (NOTE) SARS-CoV-2 target nucleic acids are NOT DETECTED.  The SARS-CoV-2 RNA is generally detectable in upper and lower respiratory specimens during the acute phase of infection. The lowest concentration of SARS-CoV-2 viral copies this assay can detect is 250 copies / mL. A negative result does not preclude SARS-CoV-2 infection and should not be used as the sole basis for treatment or other patient management decisions.  A negative result may occur with improper specimen collection / handling, submission of specimen other than nasopharyngeal swab, presence of  viral mutation(s) within the areas targeted by this assay, and inadequate number of viral copies (<250 copies / mL). A negative result must be combined with clinical observations, patient history, and epidemiological information.  Fact Sheet for Patients:   https://www.patel.info/  Fact Sheet for Healthcare Providers: https://hall.com/  This test is not yet approved or  cleared by the [place] FDA and has been authorized for detection and/or diagnosis of SARS-CoV-2 by FDA under an Emergency Use Authorization (EUA).  This EUA will remain in effect (meaning this test can be used) for the duration of the COVID-19 declaration under Section 564(b)(1) of the Act, 21 U.S.C. section 360bbb-3(b)(1), unless the authorization is terminated or revoked sooner.  Performed at [facility],  [place], [place] [address]      Total time spend on discharging this patient, including the last patient exam, discussing the hospital stay, instructions for ongoing care as it relates to all pertinent caregivers, as well as preparing the medical discharge records, prescriptions, and/or referrals as applicable, is 50 minutes.    Enzo Bi, MD  [facility] 06/18/2021, 7:25 PM

## 2021-06-18 NOTE — Progress Notes (Signed)
ADMISSION NOTE:   Pt is a 61 y.o. male who was voluntarily admitted to [facility].  Pt reported to staff while admitted on med unit "that he did not feel safe as he wanted to hurt himself.  He reports high depression with suicidal ideations with recent plan to hang himself."  Pt is alert and oriented x 4.  Pt present with calm affect and pleasant mood. Pt denies SI, HI and/or AVH.  Pt stated that he just needed help with his thinking of wanting to hurt self. Pt stated that his major stressor is being homeless and his legal issues. Pt denied using illicit drugs. He stated that he drinks about 6 -12 pk of beer monthly, and that he usually blacks out when he drinks. Pt stated that his goals are to "find a place" and "get his life together."   Pt disclosed that he has had more than 2 previous hospitalizations this year at [facility] for the same issues described above.    RN and patient discussed pt's admission plan of care and consents signed.  Pt verbalized understanding of plan of care. RN oriented pt to the staff, unit, and room.  Skin assessment was completed.  Scar noted on B/L knee and scratch  noted on left shin.  Pt's belongings secured in locker. Routine safety checks initiated. Pt is safe on the unit.   Tylenol 650 mg po prn was given at 2315 for c/o chronic B/L shoulder pain 7/10 and trazodone 25 mg po prn for c/o Insomnia.

## 2021-06-18 NOTE — Progress Notes (Signed)
Awaiting psych bed.  Covid swab completed

## 2021-06-18 NOTE — TOC Progression Note (Signed)
Transition of Care [facility]) - Progression Note    Patient Details  Name: Alyjah Lovingood MRN: [id number] Date of Birth: 10/30/1960  [facility] CM/SW Contact  Margarito Liner, LCSW Phone Number: 06/18/2021, 2:36 PM  Clinical Narrative:  Left voicemail for [facility] disposition to see if they have any beds.   Expected Discharge Plan: Home/Self Care Barriers to Discharge: Continued Medical Work up  Expected Discharge Plan and Services Expected Discharge Plan: Home/Self Care     Post Acute Care Choice: NA Living arrangements for the past 2 months: Single Family Home Expected Discharge Date: 06/18/21                                     Social Determinants of Health (SDOH) Interventions    Readmission Risk Interventions    06/16/2021   10:20 AM  Readmission Risk Prevention Plan  Transportation Screening Complete  Medication Review (RN Care Manager) Complete  PCP or Specialist appointment within 3-5 days of discharge Complete  SW Recovery Care/Counseling Consult Complete  Palliative Care Screening Not Applicable  Skilled Nursing Facility Not Applicable

## 2021-06-18 NOTE — Progress Notes (Signed)
Tech alerted this RN to patient stating he feels like he wants to hurt himself due to his brother committing suicide today. States feels he would cut himself. All items removed from his room.  MD made aware of ideation.  Charge nurse aware and looking to place sitter for safety.

## 2021-06-18 NOTE — BH Assessment (Signed)
Spoke with Unit Secretary and informed her patient need a COVID Test.

## 2021-06-18 NOTE — Progress Notes (Addendum)
PROGRESS NOTE  [name] OMB:[id number] DOB: [birth year] DOA: 06/12/2021 PCP: [name], PA  Brief History   61 yo M presenting to [facility] ED with multiple complaints, from a local motel where he is staying.  Per ED documentation, patient called EMS because of severe abdominal pain over the last several days as well as nausea and vomiting.  He reported it hurts when he moves around and presses on his abdomen. He also reported to EMS that wanted to kill himself. Also per ED documentation he reported dropping his psychiatric medicine on the floor a few days ago at the motel and was not able to collect it again so he has not been taking his medicine for the last couple of days.  Patient initially told the EDP and nursing that he had fallen and had abdominal pain while playing basketball. Of note the patient was admitted little over 2 weeks ago for cellulitis of the hand and a knee effusion, subsequently developing acute appendicitis undergoing an appendectomy approximately 9 days ago with a minor umbilical hernia repair by Dr. Claudine Mouton.  He was also expressing suicidal ideation and has a history of bipolar disorder.  During this hospitalization he was evaluated by psychiatry and was initially going to be admitted to the psychiatry unit but then reevaluated and determined to be appropriate for discharge.   Upon bedside assessment patient complaining of aching severe pain in bilateral hips, making it difficult to lift his legs off the bed or walk.  He reports having to   "walk a lot lately" because he is homeless.  He also stated his dog has been staying at the motel and has been urinating on the floor which he slipped in fell.  However, earlier he also stated he fell in the bathroom and hit his head on the tub.  He is complaining of feeling like he has " whiplash".  And that his right arm neck and head are also hurting.  He denies fever/chills, chest pain, shortness of breath and dysuria.  He does  admit to poor p.o. intake recently.  He denies tobacco use, regular alcohol use/recreational drug use.  He reported buying a bottle of vodka on 06/11/2021 but giving it away without having consumed any.   ED course: Upon arrival he told EDP that he was frustrated because he has been hurting so long and that he definitely does not want to kill himself, just wants to stop hurting.  Due to concern for postoperative complication CT abdomen pelvis performed showing moderate severity colitis and mild to moderate severity bibasilar atelectasis with a distended gallbladder without evidence of cholelithiasis.  Lab work revealed acute renal failure, AGMA, mild hyperkalemia, leukocytosis and lactic acidosis.  Sepsis protocol initiated.  While in ED after IV fluid resuscitation patient became hypotensive requiring peripheral vasopressor support.  Upon ICU assessment, patient now with new complaints of leg weakness and neck pain with possible fall described.  EDP ordered CT head and CT cervical spine which were unrevealing.  After further discussion MRI ordered for additional work-up of complaints. medications given: Zofran IV, 4 mg morphine IV, 3 L LR bolus, Lokelma, thiamine Initial Vitals: 97.6, 18, 97, 127/99 and SPO2 94% on room air, however within 2 hours of arrival patient hypotensive and tachycardic Significant labs: (Labs/ Imaging personally reviewed) I, Cheryll Cockayne Rust-Chester, AGACNP-BC, personally viewed and interpreted this ECG. EKG Interpretation: Date: 06/12/21, EKG Time: 23:47, Rate: 95, Rhythm: NSR, QRS Axis:  normal, Intervals: normal, ST/T Wave abnormalities: none, Narrative  Interpretation: NSR Chemistry: Na+:133, K+: 5.7, BUN/Cr.: 89/7.18, Serum CO2/ AG: 18/20, AST/ALT: 98/ 56, Mg: 2.4 Hematology: WBC: 20.4, Hgb: 11.0, plt: 247 CK: 3,464, Lactic/ PCT: 2.4 > 1.5 VBG: pending   CXR 06/13/21: No acute cardiopulmonary process CT head without contrast 06/13/2021: Negative for acute intracranial  abnormality CT cervical spine without contrast 06/13/2021: Mild reversal of cervical lordosis with degenerative change.  No acute osseous abnormality  Overnight the patient has stabilized.  He will be transferred to telemetry bed. Foley catheter has been placed for urinary retention. Flomax has been started.  Despite IV fluids the patient's creatinine remains elevated at 4.15 this morning. Renal ultrasound has been ordered and nephrology has been consulted.  Consultants  PCCM Nephrology  Procedures  None  Antibiotics   Anti-infectives (From admission, onward)    Start     Dose/Rate Route Frequency Ordered Stop   06/16/21 1300  amoxicillin-clavulanate (AUGMENTIN) 500-125 MG per tablet 500 mg        500 mg Oral Every 12 hours 06/16/21 1125 06/17/21 2236   06/14/21 1200  cefTRIAXone (ROCEPHIN) 2 g in sodium chloride 0.9 % 100 mL IVPB  Status:  Discontinued        2 g 200 mL/hr over 30 Minutes Intravenous Every 24 hours 06/13/21 1139 06/16/21 1125   06/13/21 0800  vancomycin (VANCOREADY) IVPB 2000 mg/400 mL        2,000 mg 200 mL/hr over 120 Minutes Intravenous  Once 06/13/21 0655 06/13/21 1023   06/13/21 0800  ceFEPIme (MAXIPIME) 2 g in sodium chloride 0.9 % 100 mL IVPB  Status:  Discontinued        2 g 200 mL/hr over 30 Minutes Intravenous Every 24 hours 06/13/21 0655 06/13/21 1138   06/13/21 0115  cefTRIAXone (ROCEPHIN) 1 g in sodium chloride 0.9 % 100 mL IVPB        1 g 200 mL/hr over 30 Minutes Intravenous STAT 06/13/21 0103 06/13/21 0356   06/13/21 0115  metroNIDAZOLE (FLAGYL) IVPB 500 mg        500 mg 100 mL/hr over 60 Minutes Intravenous  Once 06/13/21 0103 06/13/21 0356       Subjective  Cr much improved to <2 off of IVF.  Pt has been voiding fine after Foley removal.  Tried to discharge pt home this morning, but then pt told RN that he had SI and wanted to hurt himself because today is anniversary of his brother's suicide.  Psych consulted and recommended inpatient psych  admission.  Pt is medically cleared to go to inpatient psych unit.   Objective   Exam:  Constitutional: NAD, AAOx3 HEENT: conjunctivae and lids normal, EOMI CV: No cyanosis.   RESP: normal respiratory effort, on RA Neuro: II - XII grossly intact.   Psych: Normal mood and affect.     Today's Data  Vitals  Lab Data  BMP CK CBC Toxicology  Micro Data  Blood culture x 2: No growth  Imaging  CT Brain - negative CXR negative MRI Brain - No acute abnormality, small remote lacunar infarcts involving the right basal ganglia and left ventral pons. MRI L Spine: Degenerative arthritis vs evolving infection - short interval follow-up study is recommended to clarify; mild degenerative disc disease at L4-5 and L5-S1 with mild bilateral subarticular stenosis at L4-5; 1.9 cm lesion at the posterior right kidney seen again. Recommendation from 06/02/2021 was for follow up renal ultrasound in 6 months. CT abdomen and pelvis: Moderate severity colitis involving ascending and proximal  transverse.  Cardiology Data  EKG  Other Data    Scheduled Meds:  Chlorhexidine Gluconate Cloth  6 each Topical Daily   doxepin  100 mg Oral QHS   FLUoxetine  60 mg Oral Daily   folic acid  1 mg Oral Daily   heparin  5,000 Units Subcutaneous Q8H   metoprolol succinate  50 mg Oral Daily   multivitamin with minerals  1 tablet Oral Daily   OLANZapine  10 mg Oral QHS   OLANZapine  5 mg Oral Daily   pantoprazole  40 mg Oral QHS   tamsulosin  0.4 mg Oral QPC supper   thiamine  100 mg Oral Daily   Continuous Infusions:  sodium chloride Stopped (06/14/21 1439)    Principal Problem:   Septic shock (HCC) Active Problems:   Major depressive disorder, recurrent episode, moderate (HCC)   Cluster B personality disorder (HCC)   Acute renal failure (HCC)   Non-traumatic rhabdomyolysis   LOS: 5 days   A & P   Bipolar Disorder Anxiety  Major depressive disorder, recurrent, mild to  moderate PTSD SI --expressed SI this morning. --psych consult today, rec inpatient psych admission - cont prozac, zyprexa and trazodone  - cont doxepin   Suspected Sepsis with shock  Resolved.  --tachycardia, leukocytosis, hypotensive needing pressors  Colitis:  Likely source of sepsis.  --received ceftriaxone/flagyl, then vanc/cefepime, then back to ceftriaxone, then switched to augmentin.  Completed abx course.   Rhabdomyolysis --presumed due to falls.  3464 on presentation, trended down to 606.  Acute Kidney Injury  in the setting of suspected Sepsis and Rhabdomyolysis Baseline Cr: 0.93, Cr on admission: 7.18.  Cr improved with IVF, down to 1.94 this morning.  Expect to continue to improve.  Hyperkalemia, resolved  Metabolic acidosis, improved --due to severe AKI  BLE & RUE weakness - MRI brain demonstrated no acute abnormality -MRI C T & L-spine showed Findings favored to be related to be secondary to sterile/degenerative facet arthritis.  Mild Transaminitis in the setting of suspected sepsis and shock  Polysubstance abuse:  Cocaine and Opiate positive  Acute urinary retention --removed foley and passed voiding trial --cont Flomax   DVT prophylaxis: Heparin  Code Status: Full Code Family Communication: None available Disposition Plan: medically cleared to discharge to inpatient psych

## 2021-06-19 LAB — GLUCOSE, CAPILLARY: Glucose-Capillary: 123 mg/dL — ABNORMAL HIGH (ref 70–99)

## 2021-06-19 NOTE — BH Assessment (Signed)
[id number] Received patient from a off-going nurses. He was observed sleeping and noted to awaken at 0420. He is alert and oriented x4. He is being observed for ETOH withdrawal. He reported that he binge drinking. He did not disclose the last day that he had a drink.This Clinical research associate will ask patient if he knows the last time he drank.  0630 Patient has remained in bed, no complaints of pain he did awaken sevel times around 0400 but was not to be back asleep at 0445.

## 2021-06-19 NOTE — H&P (Signed)
Psychiatric Admission Assessment Adult  Patient Identification: [name] MRN:  [id number] Date of Evaluation:  06/19/2021 Chief Complaint:  Major depressive disorder, recurrent severe without psychotic features (HCC) [F33.2] Principal Diagnosis: <principal problem not specified> Diagnosis:  Active Problems:   Major depressive disorder, recurrent severe without psychotic features (HCC)  History of Present Illness: Victor FearingJames [name] [name] is a 61 y.o. male patient admitted with medical issues, now reporting suicidal ideations. He reported to staff this AM that he did not feel safe as he wanted to hurt himself.  He reports high depression with suicidal ideations with recent plan to hang himself.  High anxiety with no panic attacks.  It is the anniversary of his brother's suicide and "can't handle it."  No psychosis, paranoia, or withdrawal symptoms.  He does use cocaine frequently when not in the hospital.   [facility] with calm affect and pleasant mood. Pt denies SI, HI and/or AVH.  Pt stated that he just needed help with his thinking of wanting to hurt self. Pt stated that his major stressor is being homeless and his legal issues.He stated that he drinks about 6 -12 pk of beer monthly, and that he usually blacks out when he drinks. Pt stated that his goals are to "find a place" and "get his life together."   Pt disclosed that he has had more than 2 previous hospitalizations this year at [facility] for the same issues described above.  He is compliant with his medicines and slept well last night.    Associated Signs/Symptoms: Depression Symptoms:  depressed mood, insomnia, psychomotor retardation, hopelessness, suicidal thoughts without plan, anxiety, loss of energy/fatigue, disturbed sleep, Duration of Depression Symptoms: Greater than two weeks  (Hypo) Manic Symptoms:  Impulsivity, Anxiety Symptoms:  Excessive Worry, Psychotic Symptoms:   None  PTSD Symptoms: NA Total Time  spent with patient: 1 hour  Past Psychiatric History:  Cluster B personality disorder (HCC)   Major depressive disorder, recurrent episode, moderate (HCC)  Is the patient at risk to self? No.  Has the patient been a risk to self in the past 6 months? Yes.    Has the patient been a risk to self within the distant past? Yes.    Is the patient a risk to others? No.  Has the patient been a risk to others in the past 6 months? No.  Has the patient been a risk to others within the distant past? No.   Prior Inpatient Therapy:   Prior Outpatient Therapy:    Alcohol Screening: Patient refused Alcohol Screening Tool: Yes 1. How often do you have a drink containing alcohol?: Monthly or less 2. How many drinks containing alcohol do you have on a typical day when you are drinking?: 10 or more 3. How often do you have six or more drinks on one occasion?: Weekly AUDIT-C Score: 8 4. How often during the last year have you found that you were not able to stop drinking once you had started?: Weekly 5. How often during the last year have you failed to do what was normally expected from you because of drinking?: Never 6. How often during the last year have you needed a first drink in the morning to get yourself going after a heavy drinking session?: Never 7. How often during the last year have you had a feeling of guilt of remorse after drinking?: Weekly 8. How often during the last year have you been unable to remember what happened the night  before because you had been drinking?: Daily or almost daily 9. Have you or someone else been injured as a result of your drinking?: No 10. Has a relative or friend or a doctor or another health worker been concerned about your drinking or suggested you cut down?: No Alcohol Use Disorder Identification Test Final Score (AUDIT): 18 Alcohol Brief Interventions/Follow-up: Alcohol education/Brief advice Substance Abuse History in the last 12 months:  Yes.   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 echo 5/17: EF 60-65%, no RWMA, LV dias fxn nl, LA mildly dilated, RV sys fxn nl, PASP nl, moderate sized circumferential pericardial effusion was identified, 2.12 cm around the LV free wall, <1 cm around the RV free wall. Features were not c/w tamponade physiology   PTSD (post-traumatic stress disorder)    Witnessed brother's suicide.   Restless leg syndrome    Syncope     Past Surgical History:  Procedure Laterality Date   CYSTOSCOPY WITH URETEROSCOPY AND STENT PLACEMENT     ESOPHAGOGASTRODUODENOSCOPY N/A 01/11/2016   Procedure: ESOPHAGOGASTRODUODENOSCOPY (EGD);  Surgeon: Charlott Rakes, MD;  Location: [facility] ENDOSCOPY;  Service: Endoscopy;  Laterality: N/A;   ESOPHAGOGASTRODUODENOSCOPY N/A 04/09/2020   Procedure: ESOPHAGOGASTRODUODENOSCOPY (EGD);  Surgeon: [name], MD;  Location: [facility] ENDOSCOPY;  Service: Gastroenterology;  Laterality: N/A;   INCISION AND DRAINAGE ABSCESS Left 01/02/2018   Procedure: INCISION AND DRAINAGE LEFT KNEE;  Surgeon: Deeann Saint, MD;  Location: [facility] ORS;  Service: Orthopedics;  Laterality: Left;   JOINT REPLACEMENT Right    TKR   KNEE ARTHROSCOPY Right 06/25/2014   Procedure: ARTHROSCOPY KNEE;  Surgeon: Deeann Saint, MD;  Location: [facility] ORS;  Service: Orthopedics;  Laterality: Right;  partial arthroscopic medial menisectomy   LAPAROSCOPIC APPENDECTOMY N/A 06/02/2021   Procedure: APPENDECTOMY LAPAROSCOPIC;  Surgeon: Campbell Lerner, MD;  Location: [facility] ORS;  Service: General;  Laterality: N/A;    TOTAL KNEE ARTHROPLASTY Right 04/22/2015   Procedure: TOTAL KNEE ARTHROPLASTY;  Surgeon: Deeann Saint, MD;  Location: [facility] ORS;  Service: Orthopedics;  Laterality: Right;   TOTAL KNEE ARTHROPLASTY Left 10/30/2017   Procedure: TOTAL KNEE ARTHROPLASTY;  Surgeon: Deeann Saint, MD;  Location: [facility] ORS;  Service: Orthopedics;  Laterality: Left;   TOTAL KNEE REVISION Left 01/02/2018   Procedure: poly exchange of tibia and patella left knee;  Surgeon: Deeann Saint, MD;  Location: [facility] ORS;  Service: Orthopedics;  Laterality: Left;   UMBILICAL HERNIA REPAIR  06/02/2021   Procedure: HERNIA REPAIR UMBILICAL ADULT;  Surgeon: Campbell Lerner, MD;  Location: [facility] ORS;  Service: General;;   Family History:  Family History  Problem Relation Age of Onset   CVA Mother        deceased at age 32   Depression Brother        Died by suicide at age 51   Family Psychiatric  History: Denies Tobacco Screening:   Social History: Homeless, no family support, disability income  Social History   Substance and Sexual Activity  Alcohol Use Yes  Social History   Substance and Sexual Activity  Drug Use Yes   Types: Marijuana    Additional Social History:                           Allergies:  No Known Allergies Lab Results:  Results for orders placed or performed during the hospital encounter of 06/18/21 (from the past 48 hour(s))  Glucose, capillary     Status: Abnormal   Collection Time: 06/19/21  8:04 AM  Result Value Ref Range   Glucose-Capillary 123 (H) 70 - 99 mg/dL    Comment: Glucose reference range applies only to samples taken after fasting for at least 8 hours.    Blood Alcohol level:  Lab Results  Component Value Date   ETH <10 06/13/2021   ETH <10 05/15/2021    Metabolic Disorder Labs:  Lab Results  Component Value Date   HGBA1C 5.3 01/18/2021   MPG 105.41 01/18/2021   MPG 116.89 01/21/2019   No results found for: "PROLACTIN" Lab Results  Component Value Date    CHOL 202 (H) 01/18/2021   TRIG 172 (H) 01/18/2021   HDL 40 (L) 01/18/2021   CHOLHDL 5.1 01/18/2021   VLDL 34 01/18/2021   LDLCALC 128 (H) 01/18/2021   LDLCALC 98 01/07/2016    Current Medications: Current Facility-Administered Medications  Medication Dose Route Frequency Provider Last Rate Last Admin   acetaminophen (TYLENOL) tablet 650 mg  650 mg Oral Q6H PRN Charm Rings, NP   650 mg at 06/18/21 2315   alum & mag hydroxide-simeth (MAALOX/MYLANTA) 200-200-20 MG/5ML suspension 30 mL  30 mL Oral Q4H PRN Charm Rings, NP       docusate sodium (COLACE) capsule 100 mg  100 mg Oral BID PRN Charm Rings, NP       doxepin (SINEQUAN) capsule 100 mg  100 mg Oral QHS Charm Rings, NP   100 mg at 06/18/21 2314   FLUoxetine (PROZAC) capsule 60 mg  60 mg Oral Daily Charm Rings, NP   60 mg at 06/19/21 4098   folic acid (FOLVITE) tablet 1 mg  1 mg Oral Daily Charm Rings, NP   1 mg at 06/19/21 1191   magnesium hydroxide (MILK OF MAGNESIA) suspension 30 mL  30 mL Oral Daily PRN Charm Rings, NP       metoprolol succinate (TOPROL-XL) 24 hr tablet 50 mg  50 mg Oral Daily Charm Rings, NP   50 mg at 06/19/21 4782   multivitamin with minerals tablet 1 tablet  1 tablet Oral Daily Charm Rings, NP   1 tablet at 06/19/21 9562   naltrexone (DEPADE) tablet 50 mg  50 mg Oral Daily Charm Rings, NP   50 mg at 06/19/21 0928   OLANZapine (ZYPREXA) tablet 10 mg  10 mg Oral QHS Charm Rings, NP   10 mg at 06/18/21 2315   OLANZapine (ZYPREXA) tablet 5 mg  5 mg Oral Daily Charm Rings, NP   5 mg at 06/19/21 0928   pantoprazole (PROTONIX) EC tablet 40 mg  40 mg Oral QHS Charm Rings, NP   40 mg at 06/18/21 2314   polyethylene glycol (MIRALAX / GLYCOLAX) packet 17 g  17 g Oral Daily PRN Charm Rings, NP       tamsulosin (FLOMAX) capsule 0.4 mg  0.4 mg Oral QPC supper Charm Rings, NP  thiamine tablet 100 mg  100 mg Oral Daily Charm Rings, NP   100 mg at 06/19/21 5638    traZODone (DESYREL) tablet 25 mg  25 mg Oral QHS PRN Charm Rings, NP   25 mg at 06/18/21 2314   PTA Medications: Medications Prior to Admission  Medication Sig Dispense Refill Last Dose   doxepin (SINEQUAN) 100 MG capsule Take 1 capsule (100 mg total) by mouth at bedtime. 30 capsule 3    enalapril (VASOTEC) 10 MG tablet Hold until followup with PCP due to your acute kidney injury. 30 tablet 0    FLUoxetine (PROZAC) 20 MG capsule Take 3 capsules (60 mg total) by mouth daily. 90 capsule 2    folic acid (FOLVITE) 1 MG tablet Take 1 tablet (1 mg total) by mouth daily.      gabapentin (NEURONTIN) 400 MG capsule Take 1 capsule (400 mg total) by mouth 3 (three) times daily. 90 capsule 3    metoprolol succinate (TOPROL-XL) 50 MG 24 hr tablet Take 1 tablet (50 mg total) by mouth daily. Take with or immediately following a meal. 30 tablet 2    Multiple Vitamin (MULTIVITAMIN WITH MINERALS) TABS tablet Take 1 tablet by mouth daily.      naltrexone (DEPADE) 50 MG tablet Take 50 mg by mouth daily.      naproxen (NAPROSYN) 500 MG tablet Take 1 tablet (500 mg total) by mouth 2 (two) times daily as needed. Home med. 60 tablet 0    OLANZapine (ZYPREXA) 10 MG tablet Take 1 tablet (10 mg total) by mouth at bedtime. 30 tablet 2    OLANZapine (ZYPREXA) 5 MG tablet Take 1 tablet (5 mg total) by mouth daily. 30 tablet 2    pantoprazole (PROTONIX) 40 MG tablet Take 1 tablet (40 mg total) by mouth daily. 30 tablet 1    thiamine 100 MG tablet Take 1 tablet (100 mg total) by mouth daily.      traZODone (DESYREL) 50 MG tablet Take 0.5 tablets (25 mg total) by mouth 3 (three) times daily as needed (Anxiety). 90 tablet 3     Musculoskeletal: Strength & Muscle Tone: within normal limits Gait & Station: unsteady Patient leans: N/A            Psychiatric Specialty Exam:  Presentation  General Appearance: Disheveled; Casual  Eye Contact:Fair  Speech:Clear and Coherent  Speech  Volume:Normal  Handedness:Right   Mood and Affect  Mood:Anxious; Depressed; Hopeless  Affect:Appropriate; Congruent; Depressed   Thought Process  Thought Processes:Coherent  Duration of Psychotic Symptoms: N/A  Past Diagnosis of Schizophrenia or Psychoactive disorder: No  Descriptions of Associations:Intact  Orientation:Full (Time, Place and Person)  Thought Content:Logical; WDL  Hallucinations:No data recorded Ideas of Reference:None  Suicidal Thoughts:No data recorded Homicidal Thoughts:No data recorded  Sensorium  Memory:Immediate Fair  Judgment:Impaired  Insight:Fair   Executive Functions  Concentration:Fair  Attention Span:Fair  Recall:Fair  Fund of Knowledge:Fair  Language:Fair   Psychomotor Activity  Psychomotor Activity:No data recorded  Assets  Assets:Communication Skills; Desire for Improvement   Sleep  Sleep:No data recorded   Physical Exam: Physical Exam Vitals and nursing note reviewed.  Constitutional:      Appearance: Normal appearance. He is normal weight.  HENT:     Head: Normocephalic and atraumatic.     Right Ear: External ear normal.     Left Ear: External ear normal.     Nose: Nose normal.     Mouth/Throat:     Mouth: Mucous  membranes are moist.     Pharynx: Oropharynx is clear.  Eyes:     Extraocular Movements: Extraocular movements intact.     Conjunctiva/sclera: Conjunctivae normal.     Pupils: Pupils are equal, round, and reactive to light.  Cardiovascular:     Rate and Rhythm: Normal rate and regular rhythm.     Pulses: Normal pulses.     Heart sounds: Normal heart sounds.  Pulmonary:     Effort: Pulmonary effort is normal.     Breath sounds: Normal breath sounds.  Abdominal:     General: Abdomen is flat. Bowel sounds are normal.     Palpations: Abdomen is soft.  Musculoskeletal:        General: Normal range of motion.     Cervical back: Normal range of motion and neck supple.  Skin:    General: Skin  is warm and dry.  Neurological:     General: No focal deficit present.     Mental Status: He is alert.    Review of Systems  Constitutional: Negative.   HENT: Negative.    Eyes: Negative.   Respiratory: Negative.    Cardiovascular: Negative.   Gastrointestinal: Negative.   Genitourinary: Negative.   Musculoskeletal: Negative.   Skin: Negative.   Neurological: Negative.   Endo/Heme/Allergies: Negative.   Psychiatric/Behavioral:  Positive for depression. The patient is nervous/anxious and has insomnia.    Blood pressure 113/83, pulse 91, temperature (!) 97.2 F (36.2 C), resp. rate 18, height 5\' 9"  (1.753 m), weight 80.7 kg, SpO2 98 %. Body mass index is 26.28 kg/m.  Treatment Plan Summary: Daily contact with patient to assess and evaluate symptoms and progress in treatment, Medication management, and Plan : Continue Prozac and Zyprexa.   Observation Level/Precautions:  Fall 15 minute checks  Laboratory:   None   Psychotherapy:    Medications:    Consultations:    Discharge Concerns:    Estimated LOS:  Other:     Physician Treatment Plan for Primary Diagnosis: <principal problem not specified> Long Term Goal(s): Improvement in symptoms so as ready for discharge  Short Term Goals: Ability to verbalize feelings will improve, Ability to disclose and discuss suicidal ideas, and Ability to demonstrate self-control will improve  Physician Treatment Plan for Secondary Diagnosis: Active Problems:   Major depressive disorder, recurrent severe without psychotic features (HCC)  Long Term Goal(s): Improvement in symptoms so as ready for discharge  Short Term Goals: Compliance with prescribed medications will improve  I certify that inpatient services furnished can reasonably be expected to improve the patient's condition.    Sihaam Chrobak 6/10/202311:22 AM

## 2021-06-19 NOTE — Progress Notes (Signed)
Patient slept through the night wake up at 0657 am. No complain or discomfort voiced at this time. Remain safe on the unit with Q15 minute safety check.

## 2021-06-19 NOTE — Plan of Care (Signed)
  Problem: Education: Goal: Knowledge of General Education information will improve Description: Including pain rating scale, medication(s)/side effects and non-pharmacologic comfort measures Outcome: Progressing   Problem: Safety: Goal: Ability to remain free from injury will improve Outcome: Progressing   

## 2021-06-19 NOTE — Progress Notes (Signed)
Patient A&Ox4. Patient stated he slept well last night. Patient denies SI, HI, and A/V/H with no plan/intent. Patient ate breakfast and is med compliant. Patients main concern is speaking with social worker today. No further questions at this moment.

## 2021-06-19 NOTE — Progress Notes (Signed)
   06/19/21 1420  Clinical Encounter Type  Visited With Patient not available  Visit Type Follow-up;Social support;Spiritual support   Pt is well known to this chaplain. Will attempt to visit later; pt sleeping at this time.

## 2021-06-19 NOTE — [facility] Suicide Risk Assessment (Signed)
Suicide Risk Assessment  Admission Assessment    [facility] Admission Suicide Risk Assessment   Nursing information obtained from:  Patient Demographic factors:  Male, Living alone, Low socioeconomic status Current Mental Status:  NA Loss Factors:  Financial problems / change in socioeconomic status, Legal issues (fraud) Historical Factors:  Prior suicide attempts (cutting) Risk Reduction Factors:  NA ("i dont have any")  Total Time spent with patient: 20 minutes Principal Problem: <principal problem not specified> Diagnosis:  Active Problems:   Major depressive disorder, recurrent severe without psychotic features (HCC)  Subjective Data:  [name] is a 61 y.o. male patient admitted with medical issues, now reporting suicidal ideations. He reported to staff this AM that he did not feel safe as he wanted to hurt himself.  He reports high depression with suicidal ideations with recent plan to hang himself.  High anxiety with no panic attacks.  It is the anniversary of his brother's suicide and "can't handle it."  No psychosis, paranoia, or withdrawal symptoms.  He does use cocaine frequently when not in the hospital.   Continued Clinical Symptoms:  Alcohol Use Disorder Identification Test Final Score (AUDIT): 18 The "Alcohol Use Disorders Identification Test", Guidelines for Use in Primary Care, Second Edition.  World Science writer [facility]). Score between 0-7:  no or low risk or alcohol related problems. Score between 8-15:  moderate risk of alcohol related problems. Score between 16-19:  high risk of alcohol related problems. Score 20 or above:  warrants further diagnostic evaluation for alcohol dependence and treatment.   CLINICAL FACTORS:   Severe Anxiety and/or Agitation Depression:   Hopelessness Alcohol/Substance Abuse/Dependencies Unstable or Poor Therapeutic Relationship   Musculoskeletal: Strength & Muscle Tone: within normal limits Gait & Station: normal Patient  leans: N/A  Psychiatric Specialty Exam:  Presentation  General Appearance: Disheveled; Casual  Eye Contact:Fair  Speech:Clear and Coherent  Speech Volume:Normal  Handedness:Right   Mood and Affect  Mood:Anxious; Depressed; Hopeless  Affect:Appropriate; Congruent; Depressed   Thought Process  Thought Processes:Coherent  Descriptions of Associations:Intact  Orientation:Full (Time, Place and Person)  Thought Content:Logical; WDL  History of Schizophrenia/Schizoaffective disorder:No  Duration of Psychotic Symptoms:N/A  Hallucinations:No data recorded Ideas of Reference:None  Suicidal Thoughts:No data recorded Homicidal Thoughts:No data recorded  Sensorium  Memory:Immediate Fair  Judgment:Impaired  Insight:Fair   Executive Functions  Concentration:Fair  Attention Span:Fair  Recall:Fair  Fund of Knowledge:Fair  Language:Fair   Psychomotor Activity  Psychomotor Activity:No data recorded  Assets  Assets:Communication Skills; Desire for Improvement   Sleep  Sleep:No data recorded   Physical Exam: Physical Exam ROS Blood pressure 113/83, pulse 91, temperature (!) 97.2 F (36.2 C), resp. rate 18, height 5\' 9"  (1.753 m), weight 80.7 kg, SpO2 98 %. Body mass index is 26.28 kg/m.   COGNITIVE FEATURES THAT CONTRIBUTE TO RISK:  Polarized thinking    SUICIDE RISK:   Moderate:  Frequent suicidal ideation with limited intensity, and duration, some specificity in terms of plans, no associated intent, good self-control, limited dysphoria/symptomatology, some risk factors present, and identifiable protective factors, including available and accessible social support.  PLAN OF CARE: meds, therapy  I certify that inpatient services furnished can reasonably be expected to improve the patient's condition.   06/19/2021, 11:41 AM

## 2021-06-20 MED ORDER — HYDROXYZINE HCL 25 MG PO TABS
25.0000 mg | ORAL_TABLET | Freq: Four times a day (QID) | ORAL | Status: DC | PRN
  Administered 2021-06-20 – 2021-06-22 (×4): 25 mg via ORAL
  Filled 2021-06-20 (×4): qty 1
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
## 2021-06-20 NOTE — Progress Notes (Signed)
Patient out of room for meals but remains mostly isolative. Patient denies SI and HI but endorsed auditory hallucinations this morning. Patient given scheduled medications including scheduled zyprexa. Patient denied any command hallucinations at this moment and was told to let staff know if auditory hallucinations worsened.Patient is med compliant and also stated feeling anxious. Patient given vistaril po prn which assisted in providing some relief. Patient currently appears calmer in day area watching tv. No further actions needed at this time.

## 2021-06-20 NOTE — Progress Notes (Signed)
[facility] MD Progress Note  06/20/2021 11:33 AM [name]  MRN:  [id number] Subjective:  Patient presents with sad, flat affect. Isolative to self and room but out last night for meds and snack. No complaints voiced. Denies Si, HI, AVH. Endorses depression.   Patient reports having a rough day as it is the his brother's death anniversary who shot himself in front of the patient. He is depressed, anxious, has passive SI and AH telling him to cut. He has disturbed sleep and low appetite.  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 echo 5/17: EF 60-65%, no RWMA, LV dias fxn nl, LA mildly dilated, RV sys fxn nl, PASP nl, moderate sized circumferential pericardial effusion was identified, 2.12 cm around the LV free wall, <1 cm around the RV free wall. Features were not c/w tamponade physiology   PTSD (post-traumatic stress disorder)    Witnessed brother's suicide.   Restless leg syndrome    Syncope     Past Surgical History:  Procedure Laterality Date   CYSTOSCOPY WITH URETEROSCOPY AND STENT PLACEMENT     ESOPHAGOGASTRODUODENOSCOPY N/A 01/11/2016   Procedure: ESOPHAGOGASTRODUODENOSCOPY (EGD);  Surgeon: Charlott Rakes, MD;  Location: [facility] ENDOSCOPY;  Service: Endoscopy;  Laterality: N/A;   ESOPHAGOGASTRODUODENOSCOPY N/A 04/09/2020   Procedure:  ESOPHAGOGASTRODUODENOSCOPY (EGD);  Surgeon: [name], MD;  Location: [facility] ENDOSCOPY;  Service: Gastroenterology;  Laterality: N/A;   INCISION AND DRAINAGE ABSCESS Left 01/02/2018   Procedure: INCISION AND DRAINAGE LEFT KNEE;  Surgeon: Deeann Saint, MD;  Location: [facility] ORS;  Service: Orthopedics;  Laterality: Left;   JOINT REPLACEMENT Right    TKR   KNEE ARTHROSCOPY Right 06/25/2014   Procedure: ARTHROSCOPY KNEE;  Surgeon: Deeann Saint, MD;  Location: [facility] ORS;  Service: Orthopedics;  Laterality: Right;  partial arthroscopic medial menisectomy   LAPAROSCOPIC APPENDECTOMY N/A 06/02/2021   Procedure: APPENDECTOMY LAPAROSCOPIC;  Surgeon: Campbell Lerner, MD;  Location: [facility] ORS;  Service: General;  Laterality: N/A;   TOTAL KNEE ARTHROPLASTY Right 04/22/2015   Procedure: TOTAL KNEE ARTHROPLASTY;  Surgeon: Deeann Saint, MD;  Location: [facility] ORS;  Service: Orthopedics;  Laterality: Right;   TOTAL KNEE ARTHROPLASTY Left 10/30/2017   Procedure: TOTAL KNEE ARTHROPLASTY;  Surgeon: Deeann Saint, MD;  Location: [facility] ORS;  Service: Orthopedics;  Laterality: Left;   TOTAL KNEE REVISION Left 01/02/2018   Procedure: poly exchange of tibia and patella left knee;  Surgeon: Deeann Saint, MD;  Location: [facility] ORS;  Service: Orthopedics;  Laterality: Left;   UMBILICAL HERNIA REPAIR  06/02/2021   Procedure: HERNIA REPAIR UMBILICAL ADULT;  Surgeon: Campbell Lerner, MD;  Location: [facility] ORS;  Service: General;;   Family History:  Family History  Problem Relation Age of Onset   CVA Mother        deceased at age 74   Depression  Brother        Died by suicide at age 65   Family Psychiatric  History: Brother killed himself.  Social History:  Social History   Substance and Sexual Activity  Alcohol Use Yes     Social History   Substance and Sexual Activity  Drug Use Yes   Types: Marijuana    Social History   Socioeconomic History   Marital status: Single    Spouse name: Not on file   Number of  children: Not on file   Years of education: Not on file   Highest education level: Not on file  Occupational History   Not on file  Tobacco Use   Smoking status: Former    Packs/day: 0.75    Years: 20.00    Total pack years: 15.00    Types: Cigarettes    Quit date: 05/16/1984    Years since quitting: 37.1   Smokeless tobacco: Never  Vaping Use   Vaping Use: Never used  Substance and Sexual Activity   Alcohol use: Yes   Drug use: Yes    Types: Marijuana   Sexual activity: Not on file  Other Topics Concern   Not on file  Social History Narrative   Not on file   Social Determinants of Health   Financial Resource Strain: Not on file  Food Insecurity: Not on file  Transportation Needs: Not on file  Physical Activity: Not on file  Stress: Not on file  Social Connections: Not on file   Additional Social History:                         Sleep: Fair  Appetite:  Poor  Current Medications: Current Facility-Administered Medications  Medication Dose Route Frequency Provider Last Rate Last Admin   acetaminophen (TYLENOL) tablet 650 mg  650 mg Oral Q6H PRN Charm Rings, NP   650 mg at 06/18/21 2315   alum & mag hydroxide-simeth (MAALOX/MYLANTA) 200-200-20 MG/5ML suspension 30 mL  30 mL Oral Q4H PRN Charm Rings, NP       docusate sodium (COLACE) capsule 100 mg  100 mg Oral BID PRN Charm Rings, NP       doxepin (SINEQUAN) capsule 100 mg  100 mg Oral QHS Charm Rings, NP   100 mg at 06/19/21 2110   FLUoxetine (PROZAC) capsule 60 mg  60 mg Oral Daily Charm Rings, NP   60 mg at 06/20/21 0956   folic acid (FOLVITE) tablet 1 mg  1 mg Oral Daily Charm Rings, NP   1 mg at 06/20/21 0956   magnesium hydroxide (MILK OF MAGNESIA) suspension 30 mL  30 mL Oral Daily PRN Charm Rings, NP       metoprolol succinate (TOPROL-XL) 24 hr tablet 50 mg  50 mg Oral Daily Charm Rings, NP   50 mg at 06/20/21 0623   multivitamin with minerals tablet 1 tablet  1 tablet  Oral Daily Charm Rings, NP   1 tablet at 06/20/21 0956   naltrexone (DEPADE) tablet 50 mg  50 mg Oral Daily Charm Rings, NP   50 mg at 06/20/21 0956   OLANZapine (ZYPREXA) tablet 10 mg  10 mg Oral QHS Charm Rings, NP   10 mg at 06/19/21 2110   OLANZapine (ZYPREXA) tablet 5 mg  5 mg Oral Daily Charm Rings, NP   5 mg at 06/20/21 0956   pantoprazole (PROTONIX)  EC tablet 40 mg  40 mg Oral QHS Charm Rings, NP   40 mg at 06/19/21 2110   polyethylene glycol (MIRALAX / GLYCOLAX) packet 17 g  17 g Oral Daily PRN Charm Rings, NP       tamsulosin (FLOMAX) capsule 0.4 mg  0.4 mg Oral QPC supper Charm Rings, NP   0.4 mg at 06/19/21 1841   thiamine tablet 100 mg  100 mg Oral Daily Charm Rings, NP   100 mg at 06/20/21 1610   traZODone (DESYREL) tablet 25 mg  25 mg Oral QHS PRN Charm Rings, NP   25 mg at 06/19/21 2110    Lab Results:  Results for orders placed or performed during the hospital encounter of 06/18/21 (from the past 48 hour(s))  Glucose, capillary     Status: Abnormal   Collection Time: 06/19/21  8:04 AM  Result Value Ref Range   Glucose-Capillary 123 (H) 70 - 99 mg/dL    Comment: Glucose reference range applies only to samples taken after fasting for at least 8 hours.    Blood Alcohol level:  Lab Results  Component Value Date   ETH <10 06/13/2021   ETH <10 05/15/2021    Metabolic Disorder Labs: Lab Results  Component Value Date   HGBA1C 5.3 01/18/2021   MPG 105.41 01/18/2021   MPG 116.89 01/21/2019   No results found for: "PROLACTIN" Lab Results  Component Value Date   CHOL 202 (H) 01/18/2021   TRIG 172 (H) 01/18/2021   HDL 40 (L) 01/18/2021   CHOLHDL 5.1 01/18/2021   VLDL 34 01/18/2021   LDLCALC 128 (H) 01/18/2021   LDLCALC 98 01/07/2016    Physical Findings: AIMS:  , ,  ,  ,    CIWA:    COWS:     Musculoskeletal: Strength & Muscle Tone: within normal limits Gait & Station: normal Patient leans: N/A  Psychiatric Specialty  Exam:  Presentation  General Appearance: Disheveled; Casual  Eye Contact:Fair  Speech:Clear and Coherent  Speech Volume:Normal  Handedness:Right   Mood and Affect  Mood:Anxious; Depressed; Hopeless  Affect:Appropriate; Congruent; Depressed   Thought Process  Thought Processes:Coherent  Descriptions of Associations:Intact  Orientation:Full (Time, Place and Person)  Thought Content:Logical; WDL  History of Schizophrenia/Schizoaffective disorder:No  Duration of Psychotic Symptoms:N/A  Hallucinations:No data recorded Ideas of Reference:None  Suicidal Thoughts:No data recorded Homicidal Thoughts:No data recorded  Sensorium  Memory:Immediate Fair  Judgment:Impaired  Insight:Fair   Executive Functions  Concentration:Fair  Attention Span:Fair  Recall:Fair  Fund of Knowledge:Fair  Language:Fair   Psychomotor Activity  Psychomotor Activity:No data recorded  Assets  Assets:Communication Skills; Desire for Improvement   Sleep  Sleep:No data recorded   Physical Exam: Physical Exam Vitals and nursing note reviewed.  Constitutional:      Appearance: Normal appearance. He is normal weight.  HENT:     Head: Normocephalic and atraumatic.     Right Ear: External ear normal.     Left Ear: External ear normal.     Nose: Nose normal.     Mouth/Throat:     Mouth: Mucous membranes are moist.     Pharynx: Oropharynx is clear.  Eyes:     Extraocular Movements: Extraocular movements intact.     Conjunctiva/sclera: Conjunctivae normal.     Pupils: Pupils are equal, round, and reactive to light.  Cardiovascular:     Rate and Rhythm: Normal rate and regular rhythm.     Pulses: Normal pulses.  Heart sounds: Normal heart sounds.  Pulmonary:     Effort: Pulmonary effort is normal.     Breath sounds: Normal breath sounds.  Abdominal:     General: Abdomen is flat. Bowel sounds are normal.     Palpations: Abdomen is soft.  Musculoskeletal:         General: Normal range of motion.     Cervical back: Normal range of motion and neck supple.  Skin:    General: Skin is warm and dry.  Neurological:     General: No focal deficit present.     Mental Status: He is alert. Mental status is at baseline.    Review of Systems  Constitutional: Negative.   HENT: Negative.    Respiratory: Negative.    Cardiovascular: Negative.   Gastrointestinal: Negative.   Genitourinary: Negative.   Musculoskeletal: Negative.   Skin: Negative.   Neurological: Negative.   Endo/Heme/Allergies: Negative.   Psychiatric/Behavioral:  Positive for depression, hallucinations and suicidal ideas. The patient is nervous/anxious and has insomnia.    Blood pressure 104/76, pulse 75, temperature 98 F (36.7 C), resp. rate 20, height 5\' 9"  (1.753 m), weight 80.7 kg, SpO2 98 %. Body mass index is 26.28 kg/m.   Treatment Plan Summary: Daily contact with patient to assess and evaluate symptoms and progress in treatment, Medication management, and Plan : Continue current meds and doses; add PRN Vistaril.   Leo RodKashif Alexx Giambra 06/20/2021, 11:33 AM

## 2021-06-20 NOTE — Plan of Care (Signed)

## 2021-06-20 NOTE — Plan of Care (Signed)
  Problem: Education: Goal: Knowledge of General Education information will improve Description: Including pain rating scale, medication(s)/side effects and non-pharmacologic comfort measures Outcome: Progressing   Problem: Clinical Measurements: Goal: Will remain free from infection Outcome: Progressing   Problem: Safety: Goal: Ability to remain free from injury will improve Outcome: Progressing   

## 2021-06-20 NOTE — Progress Notes (Signed)
Patient presents with sad, flat affect. Isolative to self and room but out last night for meds and snack. No complaints voiced. Denies Si, HI, AVH. Endorses depression.  Encouragement and support provided. Safety checks maintained. Medications given as prescribed. Pt receptive and remains safe on unit with q 15 min checks.

## 2021-06-20 NOTE — [facility] Group Notes (Signed)
LCSW Wellness Group Note   06/20/2021 2:15pm  Type of Group and Topic: Psychoeducational Group:  Wellness  Participation Level:  limited participation  Description of Group  Wellness group introduces the topic and its focus on developing healthy habits across the spectrum and its relationship to a decrease in hospital admissions.  Six areas of wellness are discussed: physical, social spiritual, intellectual, occupational, and emotional.  Patients are asked to consider their current wellness habits and to identify areas of wellness where they are interested and able to focus on improvements.    Therapeutic Goals Patients will understand components of wellness and how they can positively impact overall health.  Patients will identify areas of wellness where they have developed good habits. Patients will identify areas of wellness where they would like to make improvements.    Summary of Patient Progress: pt mostly quiet during group but did respond when called on by CSW.  Toward the end pt did engage, stating that "I need help in all of these areas.  Pt did say his physical health was a positive and that his social wellness was a particular stuggle currently.       Therapeutic Modalities: Cognitive Behavioral Therapy Psychoeducation    Lorri Frederick, LCSW

## 2021-06-20 NOTE — [facility] Suicide Risk Assessment (Signed)
[facility] INPATIENT:  Family/Significant Other Suicide Prevention Education  Suicide Prevention Education:  Patient Refusal for Family/Significant Other Suicide Prevention Education: The patient [name] has refused to provide written consent for family/significant other to be provided Family/Significant Other Suicide Prevention Education during admission and/or prior to discharge.  Physician notified.  Pt reports no support person available to be contacted.   Lorri Frederick 06/20/2021, 10:43 AM

## 2021-06-20 NOTE — [facility] Group Notes (Signed)
12:30PM Group: out door courtyard  Pt refused

## 2021-06-21 NOTE — Plan of Care (Signed)

## 2021-06-21 NOTE — [facility] Suicide Risk Assessment (Cosign Needed)
[facility] Discharge Suicide Risk Assessment   Principal Problem: Major depressive disorder, recurrent, severe without psychosis Discharge Diagnoses: Active Problems:   Major depressive disorder, recurrent severe without psychotic features (HCC)   Total Time spent with patient: 45 minutes  Musculoskeletal: Strength & Muscle Tone: within normal limits Gait & Station: normal Patient leans: N/A  Psychiatric Specialty Exam: Physical Exam Vitals and nursing note reviewed.  Constitutional:      Appearance: Normal appearance.  HENT:     Head: Normocephalic.     Nose: Nose normal.  Pulmonary:     Effort: Pulmonary effort is normal.  Musculoskeletal:     Cervical back: Normal range of motion.  Neurological:     General: No focal deficit present.     Mental Status: He is alert and oriented to person, place, and time.  Psychiatric:        Attention and Perception: Attention and perception normal.        Mood and Affect: Mood is anxious and depressed.        Speech: Speech normal.        Behavior: Behavior normal. Behavior is cooperative.        Thought Content: Thought content normal.        Cognition and Memory: Cognition and memory normal.        Judgment: Judgment normal.     Review of Systems  Psychiatric/Behavioral:  Positive for depression and substance abuse. The patient is nervous/anxious.   All other systems reviewed and are negative.   Blood pressure 95/72, pulse 87, temperature (!) 97.4 F (36.3 C), resp. rate 20, height 5\' 9"  (1.753 m), weight 80.7 kg, SpO2 98 %.Body mass index is 26.28 kg/m.  General Appearance: Casual  Eye Contact:  Good  Speech:  Normal Rate  Volume:  Normal  Mood:  Anxious and Depressed  Affect:  Congruent  Thought Process:  Coherent  Orientation:  Full (Time, Place, and Person)  Thought Content:  WDL and Logical  Suicidal Thoughts:  No  Homicidal Thoughts:  No  Memory:  Immediate;   Good Recent;   Good Remote;   Good  Judgement:  Fair   Insight:  Fair  Psychomotor Activity:  Normal  Concentration:  Concentration: Good and Attention Span: Good  Recall:  Good  Fund of Knowledge:  Good  Language:  Good  Akathisia:  No  Handed:  Right  AIMS (if indicated):     Assets:  Leisure Time Physical Health Resilience  ADL's:  Intact  Cognition:  WNL  Sleep:         Physical Exam: Physical Exam Vitals and nursing note reviewed.  Constitutional:      Appearance: Normal appearance.  HENT:     Head: Normocephalic.     Nose: Nose normal.  Pulmonary:     Effort: Pulmonary effort is normal.  Musculoskeletal:     Cervical back: Normal range of motion.  Neurological:     General: No focal deficit present.     Mental Status: He is alert and oriented to person, place, and time.  Psychiatric:        Attention and Perception: Attention and perception normal.        Mood and Affect: Mood is anxious and depressed.        Speech: Speech normal.        Behavior: Behavior normal. Behavior is cooperative.        Thought Content: Thought content normal.  Cognition and Memory: Cognition and memory normal.        Judgment: Judgment normal.    Review of Systems  Psychiatric/Behavioral:  Positive for depression and substance abuse. The patient is nervous/anxious.   All other systems reviewed and are negative.  Blood pressure 95/72, pulse 87, temperature (!) 97.4 F (36.3 C), resp. rate 20, height 5\' 9"  (1.753 m), weight 80.7 kg, SpO2 98 %. Body mass index is 26.28 kg/m.  Mental Status Per Nursing Assessment::   On Admission:  NA  Demographic Factors:  Male, Caucasian, and Living alone  Loss Factors: NA  Historical Factors: NA  Risk Reduction Factors:   Positive social support and Positive therapeutic relationship  Continued Clinical Symptoms:  Depression and anxiety, mild to moderate  Cognitive Features That Contribute To Risk:  None    Suicide Risk:  Minimal: No identifiable suicidal ideation.  Patients  presenting with no risk factors but with morbid ruminations; may be classified as minimal risk based on the severity of the depressive symptoms    Plan Of Care/Follow-up recommendations:  Activity:  as tolerated Diet:  heart healthy diet  Major depressive disorder, recurrent,mild to moderate Continue Prozac 60 mg daily Continue Zyprexa 5 mg in the am and 10 mg in the pm   Insomnia: Continue Doxepin 100 mg at bedtime Continue Trazodone 25 mg at bedtime PRN  [phone], NP 06/21/2021, 9:54 AM

## 2021-06-21 NOTE — BH IP Treatment Plan (Signed)
Interdisciplinary Treatment and Diagnostic Plan Update  06/21/2021 Time of Session: 9:00AM [name] MRN: [id number]  Principal Diagnosis: <principal problem not specified>  Secondary Diagnoses: Active Problems:   Major depressive disorder, recurrent severe without psychotic features ([place])   Current Medications:  Current Facility-Administered Medications  Medication Dose Route Frequency Provider Last Rate Last Admin   acetaminophen (TYLENOL) tablet 650 mg  650 mg Oral Q6H PRN Patrecia Pour, NP   650 mg at 06/18/21 2315   alum & mag hydroxide-simeth (MAALOX/MYLANTA) 200-200-20 MG/5ML suspension 30 mL  30 mL Oral Q4H PRN Patrecia Pour, NP       docusate sodium (COLACE) capsule 100 mg  100 mg Oral BID PRN Patrecia Pour, NP       doxepin (SINEQUAN) capsule 100 mg  100 mg Oral QHS Patrecia Pour, NP   100 mg at 06/20/21 2137   FLUoxetine (PROZAC) capsule 60 mg  60 mg Oral Daily Patrecia Pour, NP   60 mg at [id number]   folic acid (FOLVITE) tablet 1 mg  1 mg Oral Daily Patrecia Pour, NP   1 mg at 06/21/21 0851   hydrOXYzine (ATARAX) tablet 25 mg  25 mg Oral Q6H PRN Malik, Kashif   25 mg at 06/21/21 0851   magnesium hydroxide (MILK OF MAGNESIA) suspension 30 mL  30 mL Oral Daily PRN Patrecia Pour, NP       metoprolol succinate (TOPROL-XL) 24 hr tablet 50 mg  50 mg Oral Daily Patrecia Pour, NP   50 mg at 06/20/21 [id number]   multivitamin with minerals tablet 1 tablet  1 tablet Oral Daily Patrecia Pour, NP   1 tablet at 06/21/21 0851   naltrexone (DEPADE) tablet 50 mg  50 mg Oral Daily Patrecia Pour, NP   50 mg at 06/21/21 0851   OLANZapine (ZYPREXA) tablet 10 mg  10 mg Oral QHS Patrecia Pour, NP   10 mg at 06/20/21 2137   OLANZapine (ZYPREXA) tablet 5 mg  5 mg Oral Daily Patrecia Pour, NP   5 mg at 06/21/21 0852   pantoprazole (PROTONIX) EC tablet 40 mg  40 mg Oral QHS Patrecia Pour, NP   40 mg at 06/20/21 2147   polyethylene glycol (MIRALAX / GLYCOLAX) packet 17 g   17 g Oral Daily PRN Patrecia Pour, NP       tamsulosin (FLOMAX) capsule 0.4 mg  0.4 mg Oral QPC supper Patrecia Pour, NP   0.4 mg at 06/20/21 1824   thiamine tablet 100 mg  100 mg Oral Daily Patrecia Pour, NP   100 mg at 06/21/21 0851   traZODone (DESYREL) tablet 25 mg  25 mg Oral QHS PRN Patrecia Pour, NP   25 mg at 06/20/21 2137   PTA Medications: Medications Prior to Admission  Medication Sig Dispense Refill Last Dose   doxepin (SINEQUAN) 100 MG capsule Take 1 capsule (100 mg total) by mouth at bedtime. 30 capsule 3    enalapril (VASOTEC) 10 MG tablet Hold until followup with PCP due to your acute kidney injury. 30 tablet 0    FLUoxetine (PROZAC) 20 MG capsule Take 3 capsules (60 mg total) by mouth daily. 90 capsule 2    folic acid (FOLVITE) 1 MG tablet Take 1 tablet (1 mg total) by mouth daily.      gabapentin (NEURONTIN) 400 MG capsule Take 1 capsule (400 mg total) by mouth 3 (three) times  daily. 90 capsule 3    metoprolol succinate (TOPROL-XL) 50 MG 24 hr tablet Take 1 tablet (50 mg total) by mouth daily. Take with or immediately following a meal. 30 tablet 2    Multiple Vitamin (MULTIVITAMIN WITH MINERALS) TABS tablet Take 1 tablet by mouth daily.      naltrexone (DEPADE) 50 MG tablet Take 50 mg by mouth daily.      naproxen (NAPROSYN) 500 MG tablet Take 1 tablet (500 mg total) by mouth 2 (two) times daily as needed. Home med. 60 tablet 0    OLANZapine (ZYPREXA) 10 MG tablet Take 1 tablet (10 mg total) by mouth at bedtime. 30 tablet 2    OLANZapine (ZYPREXA) 5 MG tablet Take 1 tablet (5 mg total) by mouth daily. 30 tablet 2    pantoprazole (PROTONIX) 40 MG tablet Take 1 tablet (40 mg total) by mouth daily. 30 tablet 1    thiamine 100 MG tablet Take 1 tablet (100 mg total) by mouth daily.      traZODone (DESYREL) 50 MG tablet Take 0.5 tablets (25 mg total) by mouth 3 (three) times daily as needed (Anxiety). 90 tablet 3     Patient Stressors: Soil scientist issue     Patient Strengths: Capable of independent living  Communication skills   Treatment Modalities: Medication Management, Group therapy, Case management,  1 to 1 session with clinician, Psychoeducation, Recreational therapy.   Physician Treatment Plan for Primary Diagnosis: <principal problem not specified> Long Term Goal(s): Improvement in symptoms so as ready for discharge   Short Term Goals: Compliance with prescribed medications will improve Ability to verbalize feelings will improve Ability to disclose and discuss suicidal ideas Ability to demonstrate self-control will improve  Medication Management: Evaluate patient's response, side effects, and tolerance of medication regimen.  Therapeutic Interventions: 1 to 1 sessions, Unit Group sessions and Medication administration.  Evaluation of Outcomes: Adequate for Discharge  Physician Treatment Plan for Secondary Diagnosis: Active Problems:   Major depressive disorder, recurrent severe without psychotic features ([place])  Long Term Goal(s): Improvement in symptoms so as ready for discharge   Short Term Goals: Compliance with prescribed medications will improve Ability to verbalize feelings will improve Ability to disclose and discuss suicidal ideas Ability to demonstrate self-control will improve     Medication Management: Evaluate patient's response, side effects, and tolerance of medication regimen.  Therapeutic Interventions: 1 to 1 sessions, Unit Group sessions and Medication administration.  Evaluation of Outcomes: Adequate for Discharge   RN Treatment Plan for Primary Diagnosis: <principal problem not specified> Long Term Goal(s): Knowledge of disease and therapeutic regimen to maintain health will improve  Short Term Goals: Ability to remain free from injury will improve, Ability to verbalize frustration and anger appropriately will improve, Ability to demonstrate self-control, Ability to participate in decision making  will improve, Ability to verbalize feelings will improve, Ability to disclose and discuss suicidal ideas, Ability to identify and develop effective coping behaviors will improve, and Compliance with prescribed medications will improve  Medication Management: RN will administer medications as ordered by provider, will assess and evaluate patient's response and provide education to patient for prescribed medication. RN will report any adverse and/or side effects to prescribing provider.  Therapeutic Interventions: 1 on 1 counseling sessions, Psychoeducation, Medication administration, Evaluate responses to treatment, Monitor vital signs and CBGs as ordered, Perform/monitor CIWA, COWS, AIMS and Fall Risk screenings as ordered, Perform wound care treatments as ordered.  Evaluation of Outcomes: Adequate for Discharge  LCSW Treatment Plan for Primary Diagnosis: <principal problem not specified> Long Term Goal(s): Safe transition to appropriate next level of care at discharge, Engage patient in therapeutic group addressing interpersonal concerns.  Short Term Goals: Engage patient in aftercare planning with referrals and resources, Increase social support, Increase ability to appropriately verbalize feelings, Increase emotional regulation, Facilitate acceptance of mental health diagnosis and concerns, Facilitate patient progression through stages of change regarding substance use diagnoses and concerns, Identify triggers associated with mental health/substance abuse issues, and Increase skills for wellness and recovery  Therapeutic Interventions: Assess for all discharge needs, 1 to 1 time with Social worker, Explore available resources and support systems, Assess for adequacy in community support network, Educate family and significant other(s) on suicide prevention, Complete Psychosocial Assessment, Interpersonal group therapy.  Evaluation of Outcomes: Adequate for Discharge   Progress in  Treatment: Attending groups: Yes. Participating in groups: Yes. Taking medication as prescribed: Yes. Toleration medication: Yes. Family/Significant other contact made: No, will contact:  Pt declined collateral contact. SPE completed with pt Patient understands diagnosis: Yes. Discussing patient identified problems/goals with staff: Yes. Medical problems stabilized or resolved: Yes. Denies suicidal/homicidal ideation: Yes. Issues/concerns per patient self-inventory: No. Other: None  New problem(s) identified: No, Describe:  None  New Short Term/Long Term Goal(s): Patient to work towards detox, elimination of symptoms of psychosis, medication management for mood stabilization; elimination of SI thoughts; development of comprehensive mental wellness/sobriety plan.   Patient Goals: "try to get better, try not to have suicidal thoughts"   Discharge Plan or Barriers: CSW will assist pt with development of appropriate discharge/aftercare plan.    Reason for Continuation of Hospitalization: Depression Medication stabilization Suicidal ideation  Estimated Length of Stay: 1-7 days  Last 3 [place] Suicide Severity Risk Score: Flowsheet Row Admission (Current) from 06/18/2021 in [facility] to Hosp-Admission (Discharged) from 06/12/2021 in [facility] [facility] to Hosp-Admission (Discharged) from 05/29/2021 in [place] [facility] (1A)  C-SSRS RISK CATEGORY Low Risk Low Risk No Risk       Last PHQ 2/9 Scores:     No data to display          Scribe for Treatment Team: Dallen Bunte A [place], [place] 06/21/2021 11:06 AM

## 2021-06-21 NOTE — Progress Notes (Signed)
Recreation Therapy Notes  Date: 06/21/2021   Time: 1:40pm    Location: Court yard  Behavioral response: N/A   Intervention Topic: Social Skills    Discussion/Intervention: Patient refused to attend group.    Clinical Observations/Feedback:  Patient refused to attend group.    Pal Shell LRT/CTRS        Christophere Hillhouse 06/21/2021 2:40 PM

## 2021-06-21 NOTE — Progress Notes (Signed)
The patient was cooperative with treatment, he denies SI/HI but he does endorse AH. He has been cooperative on shift with no new behavioral issue to report on shift at this time.

## 2021-06-21 NOTE — Progress Notes (Signed)
Patient is alert and oriented to person, time, place and situation. Endorses anxiety/depression of 10/10. Patient stated "I'm anxious about everything that is going in my life." Endorsed SI Patient stated " It's a constant thought that's running through my mind." Endorsed AVH stating "I'm hearing voices telling me to hurt myself. Reports not getting much sleep last night. PRN tylenol given for pain. Morning medications given PO without difficulty. Patient remains safe on unit- Q15 minute checks in place

## 2021-06-21 NOTE — Discharge Summary (Cosign Needed)
Physician Discharge Summary Note  Patient:  [name] is an 61 y.o., male MRN:  [id number] DOB:  1960/03/08 Patient phone:  [phone] (home)  Patient address:   [place] [address],  Total Time spent with patient: 45 minutes  Date of Admission:  06/18/2021 Date of Discharge: 06/21/21  Reason for Admission:  suicidal ideations  Principal Problem: Major depressive disorder, recurrent, severe without psychosis Discharge Diagnoses: Active Problems:   Major depressive disorder, recurrent severe without psychotic features ([place])   Past Psychiatric History: depression, anxiety, personality disorder  Past Medical History:  Past Medical History:  Diagnosis Date   Anxiety    Arthritis    knees and hands   Bipolar disorder ([place])    Depression    GERD (gastroesophageal reflux disease)    Hepatitis    HEP "C"   History of kidney stones    Hypertension    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 echo 5/17: EF 60-65%, no RWMA, LV dias fxn nl, LA mildly dilated, RV sys fxn nl, PASP nl, moderate sized circumferential pericardial effusion was identified, 2.12 cm around the LV free wall, <1 cm around the RV free wall. Features were not c/w tamponade physiology   PTSD (post-traumatic stress disorder)    Witnessed brother's suicide.   Restless leg syndrome    Syncope     Past Surgical History:  Procedure Laterality Date   CYSTOSCOPY WITH URETEROSCOPY AND STENT PLACEMENT     ESOPHAGOGASTRODUODENOSCOPY N/A 01/11/2016   Procedure: ESOPHAGOGASTRODUODENOSCOPY (EGD);  Surgeon: Wilford Corner, MD;  Location: [facility] ENDOSCOPY;  Service: Endoscopy;  Laterality: N/A;   ESOPHAGOGASTRODUODENOSCOPY N/A 04/09/2020   Procedure: ESOPHAGOGASTRODUODENOSCOPY (EGD);  Surgeon: Jonathon Bellows, MD;  Location: [facility] ENDOSCOPY;  Service: Gastroenterology;  Laterality: N/A;   INCISION AND DRAINAGE ABSCESS Left 01/02/2018   Procedure:  INCISION AND DRAINAGE LEFT KNEE;  Surgeon: Earnestine Leys, MD;  Location: [facility] ORS;  Service: Orthopedics;  Laterality: Left;   JOINT REPLACEMENT Right    TKR   KNEE ARTHROSCOPY Right 06/25/2014   Procedure: ARTHROSCOPY KNEE;  Surgeon: Earnestine Leys, MD;  Location: [facility] ORS;  Service: Orthopedics;  Laterality: Right;  partial arthroscopic medial menisectomy   LAPAROSCOPIC APPENDECTOMY N/A 06/02/2021   Procedure: APPENDECTOMY LAPAROSCOPIC;  Surgeon: Ronny Bacon, MD;  Location: [facility] ORS;  Service: General;  Laterality: N/A;   TOTAL KNEE ARTHROPLASTY Right 04/22/2015   Procedure: TOTAL KNEE ARTHROPLASTY;  Surgeon: Earnestine Leys, MD;  Location: [facility] ORS;  Service: Orthopedics;  Laterality: Right;   TOTAL KNEE ARTHROPLASTY Left 10/30/2017   Procedure: TOTAL KNEE ARTHROPLASTY;  Surgeon: Earnestine Leys, MD;  Location: [facility] ORS;  Service: Orthopedics;  Laterality: Left;   TOTAL KNEE REVISION Left 01/02/2018   Procedure: poly exchange of tibia and patella left knee;  Surgeon: Earnestine Leys, MD;  Location: [facility] ORS;  Service: Orthopedics;  Laterality: Left;   UMBILICAL HERNIA REPAIR  06/02/2021   Procedure: HERNIA REPAIR UMBILICAL ADULT;  Surgeon: Ronny Bacon, MD;  Location: [facility] ORS;  Service: General;;   Family History:  Family History  Problem Relation Age of Onset   CVA Mother        deceased at age 84   Depression Brother        Died by suicide at age 68   Family Psychiatric  History: see above Social History:  Social History   Substance and Sexual Activity  Alcohol Use Yes  Social History   Substance and Sexual Activity  Drug Use Yes   Types: Marijuana    Social History   Socioeconomic History   Marital status: Single    Spouse name: Not on file   Number of children: Not on file   Years of education: Not on file   Highest education level: Not on file  Occupational History   Not on file  Tobacco Use   Smoking status: Former    Packs/day: 0.75    Years: 20.00     Total pack years: 15.00    Types: Cigarettes    Quit date: 05/16/1984    Years since quitting: 37.1   Smokeless tobacco: Never  Vaping Use   Vaping Use: Never used  Substance and Sexual Activity   Alcohol use: Yes   Drug use: Yes    Types: Marijuana   Sexual activity: Not on file  Other Topics Concern   Not on file  Social History Narrative   Not on file   Social Determinants of Health   Financial Resource Strain: Not on file  Food Insecurity: Not on file  Transportation Needs: Not on file  Physical Activity: Not on file  Stress: Not on file  Social Connections: Not on file    Hospital Course:   On admission: 6/10: [name] is a 61 y.o. male patient admitted with medical issues, now reporting suicidal ideations. He reported to staff this AM that he did not feel safe as he wanted to hurt himself.  He reports high depression with suicidal ideations with recent plan to hang himself.  High anxiety with no panic attacks.  It is the anniversary of his brother's suicide and "can't handle it."  No psychosis, paranoia, or withdrawal symptoms.  He does use cocaine frequently when not in the hospital.    Pt presented to [facility] unit with calm affect and pleasant mood. Pt denies SI, HI and/or AVH.  Pt stated that he just needed help with his thinking of wanting to hurt self. Pt stated that his major stressor is being homeless and his legal issues.He stated that he drinks about 6 -12 pk of beer monthly, and that he usually blacks out when he drinks. Pt stated that his goals are to "find a place" and "get his life together."   Pt disclosed that he has had more than 2 previous hospitalizations this year at [facility] for the same issues described above.  He is compliant with his medicines and slept well last night.   Caveat:  He was being discharged from the medical floor and then stated he did not feel safe discharging last week, major concern is homelessness.  Medications:  Prozac 60 mg  daily, Zyprexa 5 mg in the am and 10 mg in the pm, Doxepine 100 mg daily, hydroxyzine 25 mg every six hours PRN, naltrexone 50 mg daily, Trazodone 25 mg at bedtime PRN  6/11:  Patient presents with sad, flat affect. Isolative to self and room but out last night for meds and snack. No complaints voiced. Denies Si, HI, AVH. Endorses depression.    Patient reports having a rough day as it is the his brother's death anniversary who shot himself in front of the patient. He is depressed, anxious, has passive SI and AH telling him to cut. He has disturbed sleep and low appetite.   6/12: Patient has met maximum benefit of hospitalization.  Denies suicidal/homicidal ideations, hallucinations, and withdrawal symptoms.  Discharge instructions provided with  explanations along with crisis numbers, Rx, and appointment information.  Musculoskeletal: Strength & Muscle Tone: within normal limits Gait & Station: normal Patient leans: N/A  Psychiatric Specialty Exam: Physical Exam Vitals and nursing note reviewed.  Constitutional:      Appearance: Normal appearance.  HENT:     Head: Normocephalic.     Nose: Nose normal.  Pulmonary:     Effort: Pulmonary effort is normal.  Musculoskeletal:        General: Normal range of motion.     Cervical back: Normal range of motion.  Neurological:     General: No focal deficit present.     Mental Status: He is alert and oriented to person, place, and time.  Psychiatric:        Attention and Perception: Attention and perception normal.        Mood and Affect: Mood is anxious and depressed.        Speech: Speech normal.        Behavior: Behavior normal. Behavior is cooperative.        Thought Content: Thought content normal.        Cognition and Memory: Cognition and memory normal.        Judgment: Judgment normal.     Review of Systems  Psychiatric/Behavioral:  Positive for depression. The patient is nervous/anxious.   All other systems reviewed and are  negative.   Blood pressure 95/72, pulse 87, temperature (!) 97.4 F (36.3 C), resp. rate 20, height _0  (1.753 m), weight 80.7 kg, SpO2 98 %.Body mass index is 26.28 kg/m.  General Appearance: Casual  Eye Contact:  Good  Speech:  Normal Rate  Volume:  Normal  Mood:  Anxious and Depressed  Affect:  Congruent  Thought Process:  Coherent and Descriptions of Associations: Intact  Orientation:  Full (Time, Place, and Person)  Thought Content:  WDL and Logical  Suicidal Thoughts:  No  Homicidal Thoughts:  No  Memory:  Immediate;   Good Recent;   Good Remote;   Good  Judgement:  Fair  Insight:  Fair  Psychomotor Activity:  Normal  Concentration:  Concentration: Good and Attention Span: Good  Recall:  Good  Fund of Knowledge:  Good  Language:  Good  Akathisia:  No  Handed:  Right  AIMS (if indicated):     Assets:  Physical Health Resilience Social Support  ADL's:  Intact  Cognition:  WNL  Sleep:          Physical Exam: Physical Exam Vitals and nursing note reviewed.  Constitutional:      Appearance: Normal appearance.  HENT:     Head: Normocephalic.     Nose: Nose normal.  Pulmonary:     Effort: Pulmonary effort is normal.  Musculoskeletal:        General: Normal range of motion.     Cervical back: Normal range of motion.  Neurological:     General: No focal deficit present.     Mental Status: He is alert and oriented to person, place, and time.  Psychiatric:        Attention and Perception: Attention and perception normal.        Mood and Affect: Mood is anxious and depressed.        Speech: Speech normal.        Behavior: Behavior normal. Behavior is cooperative.        Thought Content: Thought content normal.        Cognition and Memory:  Cognition and memory normal.        Judgment: Judgment normal.    Review of Systems  Psychiatric/Behavioral:  Positive for depression. The patient is nervous/anxious.   All other systems reviewed and are  negative.  Blood pressure 95/72, pulse 87, temperature (!) 97.4 F (36.3 C), resp. rate 20, height _0  (1.753 m), weight 80.7 kg, SpO2 98 %. Body mass index is 26.28 kg/m.   Social History   Tobacco Use  Smoking Status Former   Packs/day: 0.75   Years: 20.00   Total pack years: 15.00   Types: Cigarettes   Quit date: 05/16/1984   Years since quitting: 37.1  Smokeless Tobacco Never   Tobacco Cessation:  A prescription for an FDA-approved tobacco cessation medication was offered at discharge and the patient refused   Blood Alcohol level:  Lab Results  Component Value Date   ETH <10 06/13/2021   ETH <10 17/71/1657    Metabolic Disorder Labs:  Lab Results  Component Value Date   HGBA1C 5.3 01/18/2021   MPG 105.41 01/18/2021   MPG 116.89 01/21/2019   No results found for: "PROLACTIN" Lab Results  Component Value Date   CHOL 202 (H) 01/18/2021   TRIG 172 (H) 01/18/2021   HDL 40 (L) 01/18/2021   CHOLHDL 5.1 01/18/2021   VLDL 34 01/18/2021   LDLCALC 128 (H) 01/18/2021   LDLCALC 98 01/07/2016    See Psychiatric Specialty Exam and Suicide Risk Assessment completed by Attending Physician prior to discharge.  Discharge destination:  Home  Is patient on multiple antipsychotic therapies at discharge:  No   Has Patient had three or more failed trials of antipsychotic monotherapy by history:  No  Recommended Plan for Multiple Antipsychotic Therapies: NA  Discharge Instructions     Diet - low sodium heart healthy   Complete by: As directed    Discharge instructions   Complete by: As directed    Follow up with [facility] this week   Increase activity slowly   Complete by: As directed       Allergies as of 06/21/2021   No Known Allergies      Medication List     TAKE these medications      Indication  doxepin 100 MG capsule Commonly known as: SINEQUAN Take 1 capsule (100 mg total) by mouth at bedtime.  Indication: Feeling Anxious, Depression   enalapril 10 MG  tablet Commonly known as: VASOTEC Hold until followup with PCP due to your acute kidney injury.  Indication: High Blood Pressure Disorder   FLUoxetine 20 MG capsule Commonly known as: PROZAC Take 3 capsules (60 mg total) by mouth daily.  Indication: Depression   folic acid 1 MG tablet Commonly known as: FOLVITE Take 1 tablet (1 mg total) by mouth daily.    gabapentin 400 MG capsule Commonly known as: NEURONTIN Take 1 capsule (400 mg total) by mouth 3 (three) times daily.  Indication: Abuse or Misuse of Alcohol, Social Anxiety Disorder   metoprolol succinate 50 MG 24 hr tablet Commonly known as: TOPROL-XL Take 1 tablet (50 mg total) by mouth daily. Take with or immediately following a meal.    multivitamin with minerals Tabs tablet Take 1 tablet by mouth daily.    naltrexone 50 MG tablet Commonly known as: DEPADE Take 50 mg by mouth daily.    naproxen 500 MG tablet Commonly known as: Naprosyn Take 1 tablet (500 mg total) by mouth 2 (two) times daily as needed. Home med.  Indication:  Pain   OLANZapine 5 MG tablet Commonly known as: ZYPREXA Take 1 tablet (5 mg total) by mouth daily.    OLANZapine 10 MG tablet Commonly known as: ZYPREXA Take 1 tablet (10 mg total) by mouth at bedtime.    pantoprazole 40 MG tablet Commonly known as: Protonix Take 1 tablet (40 mg total) by mouth daily.  Indication: Gastroesophageal Reflux Disease   thiamine 100 MG tablet Take 1 tablet (100 mg total) by mouth daily.    traZODone 50 MG tablet Commonly known as: DESYREL Take 0.5 tablets (25 mg total) by mouth 3 (three) times daily as needed (Anxiety).  Indication: Anxiety Disorder         Follow-up recommendations:  Activity:  as tolearted Diet:  heart healthy diet Major depressive disorder, recurrent,mild to moderate Continue Prozac 60 mg daily Continue Zyprexa 5 mg in the am and 10 mg in the pm   Insomnia: Continue Doxepin 100 mg at bedtime Continue Trazodone 25 mg at  bedtime PRN  Comments:  follow up with [facility] this week.  Signed: Waylan Boga, NP 06/21/2021, 9:56 AM

## 2021-06-22 ENCOUNTER — Encounter: Payer: Medicaid Other | Admitting: Physician Assistant

## 2021-06-22 NOTE — Progress Notes (Signed)
Client was waiting on his sister yesterday to bring his ID.  This did not happen.  Then, he reported to the nurses that he was hearing voices but not responding to internal stimuli on assessment.  This behaviors or suicidal ideations typically start when he is being discharged like last week when he was discharged from the medical floor and then did not feel safe. Today, he had eaten breakfast and was in the day room.  Denies suicidal/homicidal ideations, hallucinations, and withdrawal symptoms.  Encouraged him to use his monies for housing versus substances and follow up with [facility] IOP at [facility] to assist with this as unfortunately he uses his monies for the month for substances.  Psychiatrically cleared for discharge after the social worker provides a bus pass for the shelter.  [name], PMHNP

## 2021-06-22 NOTE — Progress Notes (Signed)
  [facility] Adult Case Management Discharge Plan :  Will you be returning to the same living situation after discharge:  No.  At discharge, do you have transportation home?: Yes,  CSW assisted pt with obtaining public transportation Do you have the ability to pay for your medications: Yes,  Pt has Medicaid [employer]  Release of information consent forms completed and in the chart;  Patient's signature needed at discharge.  Patient to Follow up at:  Follow-up Information     [facility] Follow up on 06/25/2021.   Why: You have a hosptial follow up scheduled for Friday June 16th at 9am (in person). Thanks! Contact information: [address] [place] [address] [phone]                 Next level of care provider has access to [facility] Link:yes  Safety Planning and Suicide Prevention discussed: Yes,  SPE completed with pt     Has patient been referred to the Quitline?: Patient refused referral  Patient has been referred for addiction treatment: Pt. refused referral  Adron Geisel A [place], LCSWA 06/22/2021, 11:02 AM

## 2021-06-22 NOTE — Progress Notes (Signed)
Pt sitting in day room; calm, cooperative. He states "I'm alright. I'm back to hearing voices." He c/o headache, which he rates 6/10 and requests tylenol. He denies SI/HI and visual hallucinations. He endorses auditory hallucinations and says "the voices are telling me to hurt myself"; he denies command hallucinations. He describes his sleep as "pretty decent" and says that he is "having a lot of nightmares. When asked about his appetite, he says "I been eating." He expresses feelings of anxiety, which he rates 9/10 due to loud noises (group of male patients were laughing loudly). He also expresses feelings of depression, which he rates 10/10, because "It was six years ago when my brother shot himself. I just can't get it out of my head." No acute distress noted.

## 2021-06-22 NOTE — Progress Notes (Signed)
Patient is A&Ox4. Vs stable. Patient currently denies SI,HI, and A/V/H with no plan/intent. Patient stated feeling anxious regarding discharge and was given atarax po prn. Medication effective in providing some relief. Printed AVS reviewed with and given to patient along with medications and follow up appointments. All belongings returned to patient. Patient provided with courtesy car to bus station. No s/s of current distress.

## 2021-06-25 ENCOUNTER — Other Ambulatory Visit: Payer: Self-pay

## 2021-06-25 ENCOUNTER — Encounter: Payer: Self-pay | Admitting: Intensive Care

## 2021-06-25 ENCOUNTER — Emergency Department
Admission: EM | Admit: 2021-06-25 | Discharge: 2021-06-26 | Disposition: A | Discharge status: Home / Self Care | Payer: Medicaid Other | Attending: Emergency Medicine | Admitting: Emergency Medicine

## 2021-06-25 DIAGNOSIS — F331 Major depressive disorder, recurrent, moderate: Secondary | ICD-10-CM | POA: Diagnosis not present

## 2021-06-25 DIAGNOSIS — R45851 Suicidal ideations: Secondary | ICD-10-CM | POA: Diagnosis not present

## 2021-06-25 DIAGNOSIS — Z87891 Personal history of nicotine dependence: Secondary | ICD-10-CM | POA: Diagnosis not present

## 2021-06-25 DIAGNOSIS — I10 Essential (primary) hypertension: Secondary | ICD-10-CM | POA: Insufficient documentation

## 2021-06-25 DIAGNOSIS — Z96653 Presence of artificial knee joint, bilateral: Secondary | ICD-10-CM | POA: Insufficient documentation

## 2021-06-25 DIAGNOSIS — S51812A Laceration without foreign body of left forearm, initial encounter: Secondary | ICD-10-CM | POA: Insufficient documentation

## 2021-06-25 DIAGNOSIS — X838XXA Intentional self-harm by other specified means, initial encounter: Secondary | ICD-10-CM | POA: Insufficient documentation

## 2021-06-25 DIAGNOSIS — F32A Depression, unspecified: Secondary | ICD-10-CM | POA: Diagnosis not present

## 2021-06-25 DIAGNOSIS — Z20822 Contact with and (suspected) exposure to covid-19: Secondary | ICD-10-CM | POA: Diagnosis not present

## 2021-06-25 DIAGNOSIS — F1410 Cocaine abuse, uncomplicated: Secondary | ICD-10-CM

## 2021-06-25 DIAGNOSIS — S59912A Unspecified injury of left forearm, initial encounter: Secondary | ICD-10-CM | POA: Diagnosis present

## 2021-06-25 HISTORY — DX: Unspecified convulsions: R56.9

## 2021-06-25 LAB — CBC WITH DIFFERENTIAL/PLATELET
Abs Immature Granulocytes: 0.03 10*3/uL (ref 0.00–0.07)
Basophils Absolute: 0.1 10*3/uL (ref 0.0–0.1)
Basophils Relative: 1 %
Eosinophils Absolute: 0.3 10*3/uL (ref 0.0–0.5)
Eosinophils Relative: 4 %
HCT: 32.1 % — ABNORMAL LOW (ref 39.0–52.0)
Hemoglobin: 10.5 g/dL — ABNORMAL LOW (ref 13.0–17.0)
Immature Granulocytes: 1 %
Lymphocytes Relative: 24 %
Lymphs Abs: 1.6 10*3/uL (ref 0.7–4.0)
MCH: 29 pg (ref 26.0–34.0)
MCHC: 32.7 g/dL (ref 30.0–36.0)
MCV: 88.7 fL (ref 80.0–100.0)
Monocytes Absolute: 0.4 10*3/uL (ref 0.1–1.0)
Monocytes Relative: 6 %
Neutro Abs: 4.2 10*3/uL (ref 1.7–7.7)
Neutrophils Relative %: 64 %
Platelets: 350 10*3/uL (ref 150–400)
RBC: 3.62 MIL/uL — ABNORMAL LOW (ref 4.22–5.81)
RDW: 15.9 % — ABNORMAL HIGH (ref 11.5–15.5)
WBC: 6.5 10*3/uL (ref 4.0–10.5)
nRBC: 0 % (ref 0.0–0.2)

## 2021-06-25 LAB — COMPREHENSIVE METABOLIC PANEL
ALT: 20 U/L (ref 0–44)
AST: 21 U/L (ref 15–41)
Albumin: 3.4 g/dL — ABNORMAL LOW (ref 3.5–5.0)
Alkaline Phosphatase: 65 U/L (ref 38–126)
Anion gap: 10 (ref 5–15)
BUN: 57 mg/dL — ABNORMAL HIGH (ref 6–20)
CO2: 24 mmol/L (ref 22–32)
Calcium: 9.6 mg/dL (ref 8.9–10.3)
Chloride: 103 mmol/L (ref 98–111)
Creatinine, Ser: 2.34 mg/dL — ABNORMAL HIGH (ref 0.61–1.24)
GFR, Estimated: 31 mL/min — ABNORMAL LOW (ref >60)
Glucose, Bld: 110 mg/dL — ABNORMAL HIGH (ref 70–99)
Potassium: 4 mmol/L (ref 3.5–5.1)
Sodium: 137 mmol/L (ref 135–145)
Total Bilirubin: 0.7 mg/dL (ref 0.3–1.2)
Total Protein: 7.1 g/dL (ref 6.5–8.1)

## 2021-06-25 LAB — URINE DRUG SCREEN, QUALITATIVE (ARMC ONLY)
Amphetamines, Ur Screen: NOT DETECTED
Barbiturates, Ur Screen: NOT DETECTED
Benzodiazepine, Ur Scrn: NOT DETECTED
Cannabinoid 50 Ng, Ur ~~LOC~~: NOT DETECTED
Cocaine Metabolite,Ur ~~LOC~~: POSITIVE — AB
MDMA (Ecstasy)Ur Screen: NOT DETECTED
Methadone Scn, Ur: NOT DETECTED
Opiate, Ur Screen: NOT DETECTED
Phencyclidine (PCP) Ur S: NOT DETECTED
Tricyclic, Ur Screen: POSITIVE — AB

## 2021-06-25 LAB — ETHANOL: Alcohol, Ethyl (B): <10 mg/dL (ref <10)

## 2021-06-25 LAB — SALICYLATE LEVEL: Salicylate Lvl: <7 mg/dL — ABNORMAL LOW (ref 7.0–30.0)

## 2021-06-25 LAB — ACETAMINOPHEN LEVEL: Acetaminophen (Tylenol), Serum: <10 ug/mL — ABNORMAL LOW (ref 10–30)

## 2021-06-25 MED ORDER — THIAMINE HCL 100 MG PO TABS
100.0000 mg | ORAL_TABLET | Freq: Every day | ORAL | Status: DC
  Administered 2021-06-25 – 2021-06-26 (×2): 100 mg via ORAL
  Filled 2021-06-25 (×2): qty 1

## 2021-06-25 MED ORDER — OLANZAPINE 10 MG PO TABS
10.0000 mg | ORAL_TABLET | Freq: Every day | ORAL | Status: DC
  Administered 2021-06-25: 10 mg via ORAL
  Filled 2021-06-25: qty 1

## 2021-06-25 MED ORDER — DOXEPIN HCL 100 MG PO CAPS
100.0000 mg | ORAL_CAPSULE | Freq: Every day | ORAL | Status: DC
  Administered 2021-06-25: 100 mg via ORAL
  Filled 2021-06-25: qty 1

## 2021-06-25 MED ORDER — FLUOXETINE HCL 20 MG PO CAPS
60.0000 mg | ORAL_CAPSULE | Freq: Every day | ORAL | Status: DC
  Administered 2021-06-25 – 2021-06-26 (×2): 60 mg via ORAL
  Filled 2021-06-25 (×2): qty 3

## 2021-06-25 MED ORDER — METOPROLOL SUCCINATE ER 50 MG PO TB24
50.0000 mg | ORAL_TABLET | Freq: Every day | ORAL | Status: DC
  Administered 2021-06-25 – 2021-06-26 (×2): 50 mg via ORAL
  Filled 2021-06-25 (×2): qty 1

## 2021-06-25 MED ORDER — NALTREXONE HCL 50 MG PO TABS
50.0000 mg | ORAL_TABLET | Freq: Every day | ORAL | Status: DC
  Administered 2021-06-25 – 2021-06-26 (×2): 50 mg via ORAL
  Filled 2021-06-25 (×2): qty 1

## 2021-06-25 NOTE — ED Provider Notes (Signed)
[facility] Provider Note    Event Date/Time   First MD Initiated Contact with Patient 06/25/21 1848     (approximate)   History   Chief Complaint Suicidal   HPI  [name] is a 61 y.o. male with past medical history of hypertension, alcohol abuse, major depressive disorder, and bipolar disorder who presents to the ED for suicidal ideation.  Patient reports that he has been increasingly depressed and having thoughts of suicide for the past few days.  He states that he has taken a knife and tried to cut his left arm, has also had thoughts of hanging himself.  He reportedly called a suicide hotline earlier today stating that he wanted to end his life.  He was initially brought to [facility] by [employer] PD, had reported attempting to hang himself in the woods and stated "if I had a gun, I would already be dead."  Patient denies any alcohol or drug abuse, states he has otherwise been feeling well with no fevers, cough, abdominal pain, nausea, vomiting, diarrhea, or dysuria.     Physical Exam   Triage Vital Signs: ED Triage Vitals [06/25/21 1755]  Enc Vitals Group     BP 105/78     Pulse Rate 91     Resp 16     Temp (!) 97.5 F (36.4 C)     Temp Source Oral     SpO2 98 %     Weight 175 lb (79.4 kg)     Height 5\' 9"  (1.753 m)     Head Circumference      Peak Flow      Pain Score 0     Pain Loc      Pain Edu?      Excl. in GC?     Most recent vital signs: Vitals:   06/25/21 1755  BP: 105/78  Pulse: 91  Resp: 16  Temp: (!) 97.5 F (36.4 C)  SpO2: 98%    Constitutional: Alert and oriented. Eyes: Conjunctivae are normal. Head: Atraumatic. Nose: No congestion/rhinnorhea. Mouth/Throat: Mucous membranes are moist.  Cardiovascular: Normal rate, regular rhythm. Grossly normal heart sounds.  2+ radial pulses bilaterally. Respiratory: Normal respiratory effort.  No retractions. Lungs CTAB. Gastrointestinal: Soft and nontender. No  distention. Musculoskeletal: No lower extremity tenderness nor edema.  Superficial lacerations to left forearm. Neurologic:  Normal speech and language. No gross focal neurologic deficits are appreciated.    ED Results / Procedures / Treatments   Labs (all labs ordered are listed, but only abnormal results are displayed) Labs Reviewed  CBC WITH DIFFERENTIAL/PLATELET  COMPREHENSIVE METABOLIC PANEL  SALICYLATE LEVEL  ACETAMINOPHEN LEVEL  URINE DRUG SCREEN, QUALITATIVE ([facility] ONLY)  ETHANOL     PROCEDURES:  Critical Care performed: No  Procedures   MEDICATIONS ORDERED IN ED: Medications - No data to display   IMPRESSION / MDM / ASSESSMENT AND PLAN / ED COURSE  I reviewed the triage vital signs and the nursing notes.                              61 y.o. male with past medical history of hypertension, alcohol abuse, major depressive disorder, and bipolar disorder who presents to the ED for suicidal ideation with thoughts to hang himself or shoot himself.  Patient's presentation is most consistent with acute presentation with potential threat to life or bodily function.  Differential diagnosis includes, but is not limited  to, depression, suicidal ideation, mania, psychosis, substance abuse, alcohol abuse.  Patient well-appearing and in no acute distress, vital signs are unremarkable and he denies any medical complaints at this time.  Patient does have significant medical history including admission earlier this month for acute renal failure.  We will screen labs to ensure no AKI or electrolyte abnormality.  He has superficial lacerations to his left forearm not in need of repair.  Patient previously evaluated by psychiatry at [facility] and plan for admission to [facility] tomorrow if medically cleared.  The patient has been placed in psychiatric observation due to the need to provide a safe environment for the patient while obtaining psychiatric consultation and evaluation, as well  as ongoing medical and medication management to treat the patient's condition.  The patient has been placed under full IVC at this time.  Screening labs are reassuring, renal function similar to previous, no significant leukocytosis or anemia noted, Tylenol and salicylate levels are undetectable.  Patient may be medically cleared for psychiatric disposition.      FINAL CLINICAL IMPRESSION(S) / ED DIAGNOSES   Final diagnoses:  Suicidal ideation  Depression, unspecified depression type     Rx / DC Orders   ED Discharge Orders     None        Note:  This document was prepared using Dragon voice recognition software and may include unintentional dictation errors.   Chesley Noon, MD 06/25/21 2105

## 2021-06-25 NOTE — ED Notes (Signed)
Pt. Transferred to BHU from ED to room 3 after screening for contraband. Report to include Situation, Background, Assessment and Recommendations from Dawn RN. Pt. Oriented to unit including Q15 minute rounds as well as the security cameras for their protection. Patient is alert and oriented, warm and dry in no acute distress. Patient denies SI, HI, and AVH. Pt. Encouraged to let me know if needs arise.  

## 2021-06-25 NOTE — ED Triage Notes (Signed)
First Nurse Note:  Pt here from [facility] with BPD under IVC. Pt has hx of depression and multiple suicide attempts. Pt was seen at [facility]. Per [facility], pt has a bed at [facility] but is unable to go till tomorrow.  Pt is A&Ox4 and NAD

## 2021-06-25 NOTE — BH Assessment (Signed)
Patient has been accepted to [facility].  Patient assigned to [facility]. Accepting physician is Dr. Roselyn Reef.  Call report to [phone].  Representative was Kia.   ER Staff is aware of it:  Bonita Quin, ER Secretary  Dr. Larinda Buttery, ER MD  Alvis Lemmings, Patient's Nurse     Patient's bed will be available at [facility] 06/26/21.    My understanding, based on best practice we need the labs to ensure she's medically cleared/stable in order to appropriately address her psychiatric needs.

## 2021-06-25 NOTE — ED Triage Notes (Signed)
Patient brought in by [facility] PD with IVC papers. Per papers.Marland KitchenMarland Kitchen"Patient went to [facility] voluntary via PD due to sisters contacting suicide hotline reporting respondent had left reporting plan to stab himself to death. Patient is homeless. Officer reports patient throwing knife when they found him in woods and had superficial cuts on left forearm. Per collateral patient stated "If I had a gun Id already be dead." HAs not eaten in 4 days hoping to die. Reported if knife was sharper he would have killed himself. Reports he attempted to hang self in woods several days ago. Stated "I cant stop thinking about being dead, it would be better to be dead." Cocaine and alcohol use. Last hospitalized at [facility]."

## 2021-06-26 DIAGNOSIS — F32A Depression, unspecified: Secondary | ICD-10-CM

## 2021-06-26 LAB — RESP PANEL BY RT-PCR (FLU A&B, COVID) ARPGX2
Influenza A by PCR: NEGATIVE
Influenza B by PCR: NEGATIVE
SARS Coronavirus 2 by RT PCR: NEGATIVE

## 2021-06-26 NOTE — BH Assessment (Signed)
This Clinical research associate spoke with [name] [phone]) who reported that the pt has been denied due to having an IDD/MR dx that was overlooked when the pt was originally accepted.

## 2021-06-26 NOTE — ED Notes (Signed)
Report received from Hewan M, RN including SBAR. On initial round after report Pt is warm/dry, resting quietly in room without any s/s of distress.  Will continue to monitor throughout shift as ordered for any changes in behaviors and for continued safety.   

## 2021-06-26 NOTE — ED Provider Notes (Signed)
Emergency Medicine Observation Re-evaluation Note  [name] is a 61 y.o. male, seen on rounds today.  Pt initially presented to the ED for complaints of Suicidal Currently, the patient is resting comfortably.  Physical Exam  BP 105/78 (BP Location: Right Arm)   Pulse 91   Temp (!) 97.5 F (36.4 C) (Oral)   Resp 16   Ht 5\' 9"  (1.753 m)   Wt 79.4 kg   SpO2 98%   BMI 25.84 kg/m  Physical Exam Constitutional:      Appearance: He is not ill-appearing or toxic-appearing.  Pulmonary:     Effort: Pulmonary effort is normal.  Musculoskeletal:        General: No deformity.  Psychiatric:     Comments: No emotional distress     ED Course / MDM  EKG:   I have reviewed the labs performed to date as well as medications administered while in observation.  Recent changes in the last 24 hours include denied placement to [facility] due to his history of intellectual disability.  Plan  Current plan is for placement. [name] is under involuntary commitment.      [name], MD 06/26/21 [phone]

## 2021-06-26 NOTE — ED Provider Notes (Signed)
Procedures     ----------------------------------------- 3:36 PM on 06/26/2021 ----------------------------------------- Re-evaluated by psych, determined to be at chronic baseline per consult note.  Situation complicated by cocaine use and homelessness. He is medically and psych stable, IVC rescinded by psych.     Sharman Cheek, MD 06/26/21 1537

## 2021-06-26 NOTE — ED Notes (Signed)
IVC/pt denied by [facility]/pending placement

## 2021-06-26 NOTE — Consult Note (Signed)
[name] Face-to-Face Psychiatry Consult   Reason for Consult:  [facility] referral for suicidal ideations Referring Physician:  EDP Patient Identification: [name] MRN:  [id number] Principal Diagnosis: Major depressive disorder, recurrent, moderate Diagnosis:  Major depressive disorder, recurrent, moderate  Total Time spent with patient: 45 minutes  Subjective:   [name] is a 61 y.o. male patient [facility] from [facility] to go to [name].  HPI:  61 yo male who was transferred to [name] from [name] after they secured a bed for him at [name].  He told them he had suicidal ideations which unfortunately is his baseline, chronically suicidal.  In the paperwork, [facility] noted he was IDD and the bed was resended and given to another client as this was exclusionary criteria.  [name] was discharged from the geriatric unit on Tuesday and unfortunately returned to using cocaine.  He then went to [facility].  Echo receives monies monthly and unfortunately uses them on cocaine vs housing.  Today, he is at his baseline with no suicidal plans or intent, chronic fleeting suicidal ideations.  No homicidal ideations, psychosis, or withdrawal symptoms.  Psychiatrically stable and discharged.  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 echo 5/17: EF 60-65%, no RWMA, LV dias fxn nl, LA mildly dilated, RV sys fxn nl, PASP nl, moderate sized circumferential pericardial effusion was identified, 2.12 cm around the LV free wall, <1 cm around  the RV free wall. Features were not c/w tamponade physiology   PTSD (post-traumatic stress disorder)    Witnessed brother's suicide.   Restless leg syndrome    Seizures (HCC)    Syncope     Past Surgical History:  Procedure Laterality Date   CYSTOSCOPY WITH URETEROSCOPY AND STENT PLACEMENT     ESOPHAGOGASTRODUODENOSCOPY N/A 01/11/2016   Procedure: ESOPHAGOGASTRODUODENOSCOPY (EGD);  Surgeon: Charlott Rakes, MD;  Location: [facility] ENDOSCOPY;  Service: Endoscopy;  Laterality: N/A;   ESOPHAGOGASTRODUODENOSCOPY N/A 04/09/2020   Procedure: ESOPHAGOGASTRODUODENOSCOPY (EGD);  Surgeon: [name], MD;  Location: [facility] ENDOSCOPY;  Service: Gastroenterology;  Laterality: N/A;   INCISION AND DRAINAGE ABSCESS Left 01/02/2018   Procedure: INCISION AND DRAINAGE LEFT KNEE;  Surgeon: Deeann Saint, MD;  Location: [facility] ORS;  Service: Orthopedics;  Laterality: Left;   JOINT REPLACEMENT Right    TKR   KNEE ARTHROSCOPY Right 06/25/2014   Procedure: ARTHROSCOPY KNEE;  Surgeon: Deeann Saint, MD;  Location: [facility] ORS;  Service: Orthopedics;  Laterality: Right;  partial arthroscopic medial menisectomy   LAPAROSCOPIC APPENDECTOMY N/A 06/02/2021   Procedure: APPENDECTOMY LAPAROSCOPIC;  Surgeon: Campbell Lerner, MD;  Location: [facility] ORS;  Service: General;  Laterality: N/A;   TOTAL KNEE ARTHROPLASTY Right 04/22/2015   Procedure: TOTAL KNEE ARTHROPLASTY;  Surgeon: Deeann Saint, MD;  Location: [facility] ORS;  Service: Orthopedics;  Laterality: Right;   TOTAL KNEE ARTHROPLASTY Left 10/30/2017   Procedure: TOTAL KNEE ARTHROPLASTY;  Surgeon: Deeann Saint, MD;  Location: [facility] ORS;  Service: Orthopedics;  Laterality: Left;   TOTAL KNEE REVISION Left 01/02/2018   Procedure: poly exchange of tibia and patella left knee;  Surgeon: Deeann Saint, MD;  Location: [facility] ORS;  Service: Orthopedics;  Laterality: Left;   UMBILICAL HERNIA REPAIR  06/02/2021   Procedure: HERNIA REPAIR UMBILICAL ADULT;  Surgeon: Campbell Lerner, MD;   Location: [facility] ORS;  Service: General;;   Family History:  Family History  Problem Relation Age of Onset   CVA Mother        deceased at age 39   Depression Brother        Died by suicide at age 59   Family Psychiatric  History: see above Social History:  Social History   Substance and Sexual Activity  Alcohol Use Yes   Comment: rare     Social History   Substance and Sexual Activity  Drug Use Yes   Types: Marijuana, Cocaine    Social History   Socioeconomic History   Marital status: Single    Spouse name: Not on file   Number of children: Not on file   Years of education: Not on file   Highest education level: Not on file  Occupational History   Not on file  Tobacco Use   Smoking status: Former    Packs/day: 0.75    Years: 20.00    Total pack years: 15.00    Types: Cigarettes    Quit date: 05/16/1984    Years since quitting: 37.1   Smokeless tobacco: Never  Vaping Use   Vaping Use: Never used  Substance and Sexual Activity   Alcohol use: Yes    Comment: rare   Drug use: Yes    Types: Marijuana, Cocaine   Sexual activity: Not on file  Other Topics Concern   Not on file  Social History Narrative   Not on file   Social Determinants of Health   Financial Resource Strain: Not on file  Food Insecurity: Not on file  Transportation Needs: Not on file  Physical Activity: Not on file  Stress: Not on file  Social Connections: Not on file   Additional Social History:    Allergies:  No Known Allergies  Labs:  Results for orders placed or performed during the [name] encounter of 06/25/21 (from the past 48 hour(s))  CBC with Differential     Status: Abnormal   Collection Time: 06/25/21  8:15 PM  Result Value Ref Range   WBC 6.5 4.0 - 10.5 K/uL   RBC 3.62 (L) 4.22 - 5.81 MIL/uL   Hemoglobin 10.5 (L) 13.0 - 17.0 g/dL   HCT 24.5 (L) 80.9 - 98.3 %   MCV 88.7 80.0 - 100.0 fL   MCH 29.0 26.0 - 34.0 pg   MCHC 32.7 30.0 - 36.0 g/dL   RDW 38.2 (H) 50.5 -  15.5 %   Platelets 350 150 - 400 K/uL   nRBC 0.0 0.0 - 0.2 %   Neutrophils Relative % 64 %   Neutro Abs 4.2 1.7 - 7.7 K/uL   Lymphocytes Relative 24 %   Lymphs Abs 1.6 0.7 - 4.0 K/uL   Monocytes Relative 6 %   Monocytes Absolute 0.4 0.1 - 1.0 K/uL   Eosinophils Relative 4 %   Eosinophils Absolute 0.3 0.0 - 0.5 K/uL   Basophils Relative 1 %   Basophils Absolute 0.1 0.0 - 0.1 K/uL   Immature Granulocytes 1 %   Abs Immature Granulocytes 0.03 0.00 - 0.07 K/uL    Comment: Performed at [employer]  [facility], [address] [place], [place] [address]  Comprehensive metabolic panel     Status: Abnormal   Collection Time: 06/25/21  8:15 PM  Result Value Ref Range   Sodium 137 135 - 145 mmol/L   Potassium 4.0 3.5 - 5.1 mmol/L   Chloride 103 98 - 111 mmol/L   CO2 24 22 - 32 mmol/L   Glucose, Bld 110 (H) 70 - 99 mg/dL    Comment: Glucose reference range applies only to samples taken after fasting for at least 8 hours.   BUN 57 (H) 6 - 20 mg/dL   Creatinine, Ser 6.042.34 (H) 0.61 - 1.24 mg/dL   Calcium 9.6 8.9 - 54.010.3 mg/dL   Total Protein 7.1 6.5 - 8.1 g/dL   Albumin 3.4 (L) 3.5 - 5.0 g/dL   AST 21 15 - 41 U/L   ALT 20 0 - 44 U/L   Alkaline Phosphatase 65 38 - 126 U/L   Total Bilirubin 0.7 0.3 - 1.2 mg/dL   GFR, Estimated 31 (L) >60 mL/min    Comment: (NOTE) Calculated using the CKD-EPI Creatinine Equation (2021)    Anion gap 10 5 - 15    Comment: Performed at [name], [address] [place], [place] [address]  Salicylate level     Status: Abnormal   Collection Time: 06/25/21  8:15 PM  Result Value Ref Range   Salicylate Lvl <7.0 (L) 7.0 - 30.0 mg/dL    Comment: Performed at [name], [address] [place], [place] [address]  Acetaminophen level     Status: Abnormal   Collection Time: 06/25/21  8:15 PM  Result Value Ref Range   Acetaminophen (Tylenol), Serum <10 (L) 10 - 30 ug/mL    Comment: (NOTE) Therapeutic concentrations vary significantly. A  range of 10-30 ug/mL  may be an effective concentration for many patients. However, some  are best treated at concentrations outside of this range. Acetaminophen concentrations >150 ug/mL at 4 hours after ingestion  and >50 ug/mL at 12 hours after ingestion are often associated with  toxic reactions.  Performed at [name], [address] [place], [place] [address]   Urine Drug Screen, Qualitative     Status: Abnormal   Collection Time: 06/25/21  8:15 PM  Result Value Ref Range   Tricyclic, Ur Screen POSITIVE (A) NONE DETECTED   Amphetamines, Ur Screen NONE DETECTED NONE DETECTED   MDMA (Ecstasy)Ur Screen NONE DETECTED NONE DETECTED   Cocaine Metabolite,Ur [place] POSITIVE (A) NONE DETECTED   Opiate, Ur Screen NONE DETECTED NONE DETECTED   Phencyclidine (PCP) Ur S NONE DETECTED NONE DETECTED   Cannabinoid 50 Ng, Ur [place] NONE DETECTED NONE DETECTED   Barbiturates, Ur Screen NONE DETECTED NONE DETECTED   Benzodiazepine, Ur Scrn NONE DETECTED NONE DETECTED   Methadone Scn, Ur NONE DETECTED NONE DETECTED    Comment: (NOTE) Tricyclics + metabolites, urine    Cutoff 1000 ng/mL Amphetamines + metabolites, urine  Cutoff 1000 ng/mL MDMA (Ecstasy), urine              Cutoff 500 ng/mL Cocaine Metabolite, urine          Cutoff 300 ng/mL Opiate + metabolites, urine        Cutoff 300 ng/mL Phencyclidine (PCP), urine         Cutoff 25 ng/mL Cannabinoid, urine                 Cutoff 50 ng/mL Barbiturates + metabolites, urine  Cutoff 200 ng/mL Benzodiazepine, urine              Cutoff 200 ng/mL Methadone, urine                   Cutoff 300 ng/mL  The urine drug screen provides only a preliminary, unconfirmed analytical test result and should not be used for non-medical purposes. Clinical consideration and professional judgment should be applied to any positive drug screen result due to possible interfering substances. A more specific alternate chemical method must be used in order to  obtain a confirmed analytical result. Gas chromatography / mass spectrometry (GC/MS) is the preferred confirm atory method. Performed at [facility], [address] [place], [place] [address]   Ethanol     Status: None   Collection Time: 06/25/21  8:15 PM  Result Value Ref Range   Alcohol, Ethyl (B) <10 <10 mg/dL    Comment: (NOTE) Lowest detectable limit for serum alcohol is 10 mg/dL.  For medical purposes only. Performed at [name], [address] [place], [place] [address]   Resp Panel by RT-PCR (Flu A&B, Covid)     Status: None   Collection Time: 06/25/21 10:35 PM   Specimen: Nasal Swab  Result Value Ref Range   SARS Coronavirus 2 by RT PCR NEGATIVE NEGATIVE    Comment: (NOTE) SARS-CoV-2 target nucleic acids are NOT DETECTED.  The SARS-CoV-2 RNA is generally detectable in upper respiratory specimens during the acute phase of infection. The lowest concentration of SARS-CoV-2 viral copies this assay can detect is 138 copies/mL. A negative result does not preclude SARS-Cov-2 infection and should not be used as the sole basis for treatment or other patient management decisions. A negative result may occur with  improper specimen collection/handling, submission of specimen other than nasopharyngeal swab, presence of viral mutation(s) within the areas targeted by this assay, and inadequate number of viral copies(<138 copies/mL). A negative result must be combined with clinical observations, patient history, and epidemiological information. The expected result is Negative.  Fact Sheet for Patients:  BloggerCourse.com  Fact Sheet for Healthcare Providers:  SeriousBroker.it  This test is no t yet approved or cleared by the [place] FDA and  has been authorized for detection and/or diagnosis of SARS-CoV-2 by FDA under an Emergency Use Authorization (EUA). This EUA will remain  in effect (meaning  this test can be used) for the duration of the COVID-19 declaration under Section 564(b)(1) of the Act, 21 U.S.C.section 360bbb-3(b)(1), unless the authorization is terminated  or revoked sooner.       Influenza A by PCR NEGATIVE NEGATIVE   Influenza B by PCR NEGATIVE NEGATIVE    Comment: (NOTE) The Xpert Xpress SARS-CoV-2/FLU/RSV plus assay is intended as an aid in the diagnosis of influenza from Nasopharyngeal swab specimens and should not be used as a sole basis for treatment. Nasal washings and aspirates are unacceptable for Xpert Xpress SARS-CoV-2/FLU/RSV testing.  Fact Sheet for Patients: BloggerCourse.com  Fact Sheet for Healthcare Providers: SeriousBroker.it  This test is not yet approved or cleared by the [place] FDA and has been authorized for detection and/or diagnosis of SARS-CoV-2 by FDA under an Emergency Use Authorization (EUA). This EUA will remain in effect (meaning this test can be used) for the duration of the COVID-19 declaration under Section 564(b)(1) of the Act, 21 U.S.C. section 360bbb-3(b)(1), unless the authorization is terminated or revoked.  Performed at [facility], [address], [place], [place] [address]  Current Facility-Administered Medications  Medication Dose Route Frequency Provider Last Rate Last Admin   doxepin (SINEQUAN) capsule 100 mg  100 mg Oral QHS Chesley Noon, MD   100 mg at 06/25/21 2151   FLUoxetine (PROZAC) capsule 60 mg  60 mg Oral Daily Chesley Noon, MD   60 mg at 06/26/21 1012   metoprolol succinate (TOPROL-XL) 24 hr tablet 50 mg  50 mg Oral Daily Chesley Noon, MD   50 mg at 06/26/21 1013   naltrexone (DEPADE) tablet 50 mg  50 mg Oral Daily Chesley Noon, MD   50 mg at 06/26/21 1014   OLANZapine (ZYPREXA) tablet 10 mg  10 mg Oral QHS Chesley Noon, MD   10 mg at 06/25/21 2121   thiamine tablet 100 mg  100 mg Oral Daily Chesley Noon, MD    100 mg at 06/26/21 1014   Current Outpatient Medications  Medication Sig Dispense Refill   doxepin (SINEQUAN) 100 MG capsule Take 1 capsule (100 mg total) by mouth at bedtime. 30 capsule 3   FLUoxetine (PROZAC) 20 MG capsule Take 3 capsules (60 mg total) by mouth daily. 90 capsule 2   folic acid (FOLVITE) 1 MG tablet Take 1 tablet (1 mg total) by mouth daily.     gabapentin (NEURONTIN) 400 MG capsule Take 1 capsule (400 mg total) by mouth 3 (three) times daily. 90 capsule 3   lithium carbonate (LITHOBID) 300 MG CR tablet Take 300 mg by mouth 2 (two) times daily.     metoprolol succinate (TOPROL-XL) 50 MG 24 hr tablet Take 1 tablet (50 mg total) by mouth daily. Take with or immediately following a meal. 30 tablet 2   Multiple Vitamin (MULTIVITAMIN WITH MINERALS) TABS tablet Take 1 tablet by mouth daily.     naltrexone (DEPADE) 50 MG tablet Take 50 mg by mouth daily.     naproxen (NAPROSYN) 500 MG tablet Take 1 tablet (500 mg total) by mouth 2 (two) times daily as needed. Home med. 60 tablet 0   OLANZapine (ZYPREXA) 10 MG tablet Take 1 tablet (10 mg total) by mouth at bedtime. 30 tablet 2   OLANZapine (ZYPREXA) 5 MG tablet Take 1 tablet (5 mg total) by mouth daily. 30 tablet 2   thiamine 100 MG tablet Take 1 tablet (100 mg total) by mouth daily.     traZODone (DESYREL) 50 MG tablet Take 0.5 tablets (25 mg total) by mouth 3 (three) times daily as needed (Anxiety). 90 tablet 3   enalapril (VASOTEC) 10 MG tablet Hold until followup with PCP due to your acute kidney injury. (Patient not taking: Reported on 06/25/2021) 30 tablet 0    Musculoskeletal: Strength & Muscle Tone: within normal limits Gait & Station: normal Patient leans: N/A Psychiatric Specialty Exam: Physical Exam Vitals and nursing note reviewed.  Constitutional:      Appearance: Normal appearance.  HENT:     Head: Normocephalic.     Nose: Nose normal.  Pulmonary:     Effort: Pulmonary effort is normal.  Musculoskeletal:         General: Normal range of motion.     Cervical back: Normal range of motion.  Neurological:     General: No focal deficit present.     Mental Status: He is alert and oriented to person, place, and time.  Psychiatric:        Attention and Perception: Attention and perception normal.        Mood and Affect: Mood is anxious and depressed.  Speech: Speech normal.        Behavior: Behavior normal. Behavior is cooperative.        Thought Content: Thought content normal.        Cognition and Memory: Cognition and memory normal.        Judgment: Judgment normal.     Review of Systems  Psychiatric/Behavioral:  Positive for depression. The patient is nervous/anxious.   All other systems reviewed and are negative.   Blood pressure 105/64, pulse 70, temperature 97.7 F (36.5 C), temperature source Oral, resp. rate 18, height 5\' 9"  (1.753 m), weight 79.4 kg, SpO2 100 %.Body mass index is 25.84 kg/m.  General Appearance: Casual  Eye Contact:  Good  Speech:  Normal Rate  Volume:  Normal  Mood:  Anxious and Depressed  Affect:  Congruent  Thought Process:  Coherent and Descriptions of Associations: Intact  Orientation:  Full (Time, Place, and Person)  Thought Content:  WDL and Logical  Suicidal Thoughts:  NO intent or plan  Homicidal Thoughts:  No  Memory:  Immediate;   Good Recent;   Good Remote;   Good  Judgement:  Fair  Insight:  Good  Psychomotor Activity:  Normal  Concentration:  Concentration: Good and Attention Span: Good  Recall:  Good  Fund of Knowledge:  Good  Language:  Good  Akathisia:  No  Handed:  Right  AIMS (if indicated):     Assets:  Leisure Time Physical Health Resilience Social Support  ADL's:  Intact  Cognition:  WNL  Sleep:        Physical Exam: Physical Exam Vitals and nursing note reviewed.  Constitutional:      Appearance: Normal appearance.  HENT:     Head: Normocephalic.     Nose: Nose normal.  Pulmonary:     Effort: Pulmonary effort is  normal.  Musculoskeletal:        General: Normal range of motion.     Cervical back: Normal range of motion.  Neurological:     General: No focal deficit present.     Mental Status: He is alert and oriented to person, place, and time.  Psychiatric:        Attention and Perception: Attention and perception normal.        Mood and Affect: Mood is anxious and depressed.        Speech: Speech normal.        Behavior: Behavior normal. Behavior is cooperative.        Thought Content: Thought content normal.        Cognition and Memory: Cognition and memory normal.        Judgment: Judgment normal.    Review of Systems  Psychiatric/Behavioral:  Positive for depression. The patient is nervous/anxious.   All other systems reviewed and are negative.  Blood pressure 105/64, pulse 70, temperature 97.7 F (36.5 C), temperature source Oral, resp. rate 18, height 5\' 9"  (1.753 m), weight 79.4 kg, SpO2 100 %. Body mass index is 25.84 kg/m.  Treatment Plan Summary: Major depressive disorder, recurrent,mild to moderate Continue Prozac 60 mg daily Continue Zyprexa 5 mg in the am and 10 mg in the pm  Insomnia: Continue Doxepin 100 mg at bedtime Continue Trazodone 25 mg at bedtime PRN  Disposition:  Discharge and follow up with [facility]  , NP 06/26/2021 2:32 PM

## 2021-06-26 NOTE — BH Assessment (Addendum)
Per [facility] pt recommended IVC and inpatient treatment due to pt being a danger to himself.  Per [facility] paperwork pt faxed to: Referral information for Psychiatric Hospitalization faxed to;   [facility] [phone]- [phone])  [facility] [phone]- [phone]),   [address] [phone]),   [facility] [phone] -[phone]), Pt originally accepted, but later denied due to IDD diagnosis.  Referral information for Psychiatric Hospitalization additionally faxed to by this writer:   [facility] [phone]) [phone]  [facility] [phone]), Per Eunice Blase, facility is full at this time.   [place] [phone] or [phone]),

## 2021-06-26 NOTE — BH Assessment (Signed)
Writer spoke with the patient to complete an updated/reassessment. Patient denies SI/HI and AV/H.  Patient provided information and resources for shelters.

## 2021-06-26 NOTE — ED Notes (Signed)
Hospital meal provided, pt tolerated w/o complaints.  Waste discarded appropriately.  

## 2021-06-26 NOTE — ED Notes (Signed)
Rescinded by Jamie Lord NP   

## 2021-06-26 NOTE — ED Provider Notes (Signed)
Patient under IVC.  [facility] but now declined due to history of IDD/MR.   Nohlan Burdin, Layla Maw, DO 06/26/21 [phone]

## 2021-06-26 NOTE — Discharge Instructions (Addendum)
Please below the options for shelter in the surrounding area:   [facility] [phone] Option [address] [place])  [phone] Option 3   [facility] [place])  [phone]  [facility]  [phone] [place])  [phone]  [facility] [place])  [phone]  [facility]  [phone]  You may also go to the [facility] at [address], who assists with the following services:  This is a safe place to rest between the hours of 8AM and 3PM, take care of basic needs and access the services and community that make all the difference. Our guests come to the [facility] to take a class, do laundry, meet with a case manager or to get their mail. Sometimes they just need to sit in our dayroom and enjoy a conversation.  Here you will find everything from shower facilities to a computer lab, a mail room, classrooms and meeting spaces.  The [facility] helps people reconnect with their own lives and with the community at large.

## 2021-06-26 NOTE — ED Notes (Addendum)
Pt came to staff and asked "why can't [facility] fill out the papers right, where am I suppose to go now?"  Staff explained to pt that he is able to use the telephone if he would like to contact [facility] to question this 'paperwork' he turned away from staff and returned to room.  Cont to monitor as ordered

## 2021-06-27 ENCOUNTER — Emergency Department
Admission: EM | Admit: 2021-06-27 | Discharge: 2021-06-29 | Disposition: A | Discharge status: Other Acute Inpatient Hospital | Payer: Medicaid Other | Attending: Emergency Medicine | Admitting: Emergency Medicine

## 2021-06-27 ENCOUNTER — Other Ambulatory Visit: Payer: Self-pay

## 2021-06-27 DIAGNOSIS — Z96653 Presence of artificial knee joint, bilateral: Secondary | ICD-10-CM | POA: Diagnosis not present

## 2021-06-27 DIAGNOSIS — I10 Essential (primary) hypertension: Secondary | ICD-10-CM | POA: Insufficient documentation

## 2021-06-27 DIAGNOSIS — Z046 Encounter for general psychiatric examination, requested by authority: Secondary | ICD-10-CM | POA: Insufficient documentation

## 2021-06-27 DIAGNOSIS — Z765 Malingerer [conscious simulation]: Secondary | ICD-10-CM | POA: Diagnosis not present

## 2021-06-27 DIAGNOSIS — F99 Mental disorder, not otherwise specified: Secondary | ICD-10-CM | POA: Insufficient documentation

## 2021-06-27 DIAGNOSIS — F332 Major depressive disorder, recurrent severe without psychotic features: Secondary | ICD-10-CM | POA: Diagnosis not present

## 2021-06-27 DIAGNOSIS — S59912A Unspecified injury of left forearm, initial encounter: Secondary | ICD-10-CM | POA: Diagnosis present

## 2021-06-27 DIAGNOSIS — S51812A Laceration without foreign body of left forearm, initial encounter: Secondary | ICD-10-CM | POA: Insufficient documentation

## 2021-06-27 DIAGNOSIS — Z20822 Contact with and (suspected) exposure to covid-19: Secondary | ICD-10-CM | POA: Insufficient documentation

## 2021-06-27 DIAGNOSIS — X838XXA Intentional self-harm by other specified means, initial encounter: Secondary | ICD-10-CM | POA: Diagnosis not present

## 2021-06-27 DIAGNOSIS — F331 Major depressive disorder, recurrent, moderate: Secondary | ICD-10-CM | POA: Diagnosis present

## 2021-06-27 DIAGNOSIS — F1490 Cocaine use, unspecified, uncomplicated: Secondary | ICD-10-CM | POA: Diagnosis not present

## 2021-06-27 DIAGNOSIS — Z79899 Other long term (current) drug therapy: Secondary | ICD-10-CM | POA: Insufficient documentation

## 2021-06-27 DIAGNOSIS — F32A Depression, unspecified: Secondary | ICD-10-CM

## 2021-06-27 DIAGNOSIS — Z9189 Other specified personal risk factors, not elsewhere classified: Secondary | ICD-10-CM

## 2021-06-27 LAB — COMPREHENSIVE METABOLIC PANEL
ALT: 23 U/L (ref 0–44)
AST: 19 U/L (ref 15–41)
Albumin: 4.1 g/dL (ref 3.5–5.0)
Alkaline Phosphatase: 79 U/L (ref 38–126)
Anion gap: 9 (ref 5–15)
BUN: 29 mg/dL — ABNORMAL HIGH (ref 6–20)
CO2: 23 mmol/L (ref 22–32)
Calcium: 10.8 mg/dL — ABNORMAL HIGH (ref 8.9–10.3)
Chloride: 105 mmol/L (ref 98–111)
Creatinine, Ser: 1.5 mg/dL — ABNORMAL HIGH (ref 0.61–1.24)
GFR, Estimated: 54 mL/min — ABNORMAL LOW (ref >60)
Glucose, Bld: 101 mg/dL — ABNORMAL HIGH (ref 70–99)
Potassium: 4.5 mmol/L (ref 3.5–5.1)
Sodium: 137 mmol/L (ref 135–145)
Total Bilirubin: 0.7 mg/dL (ref 0.3–1.2)
Total Protein: 8.7 g/dL — ABNORMAL HIGH (ref 6.5–8.1)

## 2021-06-27 LAB — LITHIUM LEVEL: Lithium Lvl: <0.06 mmol/L — ABNORMAL LOW (ref 0.60–1.20)

## 2021-06-27 LAB — RESP PANEL BY RT-PCR (FLU A&B, COVID) ARPGX2
Influenza A by PCR: NEGATIVE
Influenza B by PCR: NEGATIVE
SARS Coronavirus 2 by RT PCR: NEGATIVE

## 2021-06-27 LAB — CBC
HCT: 36.9 % — ABNORMAL LOW (ref 39.0–52.0)
Hemoglobin: 11.8 g/dL — ABNORMAL LOW (ref 13.0–17.0)
MCH: 28.2 pg (ref 26.0–34.0)
MCHC: 32 g/dL (ref 30.0–36.0)
MCV: 88.3 fL (ref 80.0–100.0)
Platelets: 456 10*3/uL — ABNORMAL HIGH (ref 150–400)
RBC: 4.18 MIL/uL — ABNORMAL LOW (ref 4.22–5.81)
RDW: 15.1 % (ref 11.5–15.5)
WBC: 6.8 10*3/uL (ref 4.0–10.5)
nRBC: 0 % (ref 0.0–0.2)

## 2021-06-27 LAB — SALICYLATE LEVEL: Salicylate Lvl: <7 mg/dL — ABNORMAL LOW (ref 7.0–30.0)

## 2021-06-27 LAB — URINE DRUG SCREEN, QUALITATIVE (ARMC ONLY)
Amphetamines, Ur Screen: NOT DETECTED
Barbiturates, Ur Screen: NOT DETECTED
Benzodiazepine, Ur Scrn: NOT DETECTED
Cannabinoid 50 Ng, Ur ~~LOC~~: NOT DETECTED
Cocaine Metabolite,Ur ~~LOC~~: POSITIVE — AB
MDMA (Ecstasy)Ur Screen: NOT DETECTED
Methadone Scn, Ur: NOT DETECTED
Opiate, Ur Screen: NOT DETECTED
Phencyclidine (PCP) Ur S: NOT DETECTED
Tricyclic, Ur Screen: POSITIVE — AB

## 2021-06-27 LAB — ACETAMINOPHEN LEVEL: Acetaminophen (Tylenol), Serum: <10 ug/mL — ABNORMAL LOW (ref 10–30)

## 2021-06-27 LAB — ETHANOL: Alcohol, Ethyl (B): <10 mg/dL (ref <10)

## 2021-06-27 NOTE — ED Provider Notes (Signed)
[facility] Provider Note    Event Date/Time   First MD Initiated Contact with Patient 06/27/21 2323     (approximate)   History   Mental Health Problem   HPI  Marisa Colon Mortensen is a 61 y.o. male brought to the ED under IVC by [employer] for self-harm.  Patient with a history of PTSD who was having flashbacks to his brother suicide.  Made multiple superficial cuts to his left forearm with a clean razor.  Reports tetanus is up-to-date.  Also endorses cocaine use today.  Voices no other complaints or injuries.  Denies HI/AH/VH.     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 echo 5/17: EF 60-65%, no RWMA, LV dias fxn nl, LA mildly dilated, RV sys fxn nl, PASP nl, moderate sized circumferential pericardial effusion was identified, 2.12 cm around the LV free wall, <1 cm around the RV free wall. Features were not c/w tamponade physiology   PTSD (post-traumatic stress disorder)    Witnessed brother's suicide.   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   Septic shock (HCC) 06/13/2021   Acute renal failure (HCC)    Non-traumatic rhabdomyolysis    Acute appendicitis 06/02/2021   Cellulitis of right hand 05/29/2021   Effusion of bursa of left knee 05/29/2021   Depression 05/29/2021   Major depressive disorder, recurrent episode, moderate (HCC) 05/15/2021   Cluster B personality disorder (HCC) 05/03/2021   Bipolar disorder (HCC) 01/18/2021   Essential hypertension  01/18/2021   Alcohol abuse 01/18/2021     Past Surgical History   Past Surgical History:  Procedure Laterality Date   CYSTOSCOPY WITH URETEROSCOPY AND STENT PLACEMENT     ESOPHAGOGASTRODUODENOSCOPY N/A 01/11/2016   Procedure: ESOPHAGOGASTRODUODENOSCOPY (EGD);  Surgeon: Charlott Rakes, MD;  Location: [facility] ENDOSCOPY;  Service: Endoscopy;  Laterality: N/A;   ESOPHAGOGASTRODUODENOSCOPY N/A 04/09/2020   Procedure: ESOPHAGOGASTRODUODENOSCOPY (EGD);  Surgeon: [name], MD;  Location: [facility] ENDOSCOPY;  Service: Gastroenterology;  Laterality: N/A;   INCISION AND DRAINAGE ABSCESS Left 01/02/2018   Procedure: INCISION AND DRAINAGE LEFT KNEE;  Surgeon: Deeann Saint, MD;  Location: [facility] ORS;  Service: Orthopedics;  Laterality: Left;   JOINT REPLACEMENT Right    TKR   KNEE ARTHROSCOPY Right 06/25/2014   Procedure: ARTHROSCOPY KNEE;  Surgeon: Deeann Saint, MD;  Location: [facility] ORS;  Service: Orthopedics;  Laterality: Right;  partial arthroscopic medial menisectomy   LAPAROSCOPIC APPENDECTOMY N/A 06/02/2021   Procedure: APPENDECTOMY LAPAROSCOPIC;  Surgeon: Campbell Lerner, MD;  Location: [facility] ORS;  Service: General;  Laterality: N/A;   TOTAL KNEE ARTHROPLASTY Right 04/22/2015   Procedure: TOTAL KNEE ARTHROPLASTY;  Surgeon: Deeann Saint, MD;  Location: [facility] ORS;  Service: Orthopedics;  Laterality: Right;   TOTAL KNEE ARTHROPLASTY Left 10/30/2017   Procedure: TOTAL KNEE ARTHROPLASTY;  Surgeon: Deeann Saint, MD;  Location: [facility] ORS;  Service: Orthopedics;  Laterality: Left;   TOTAL KNEE REVISION Left 01/02/2018   Procedure: poly exchange of tibia and patella left knee;  Surgeon: Deeann Saint, MD;  Location: [facility] ORS;  Service: Orthopedics;  Laterality: Left;   UMBILICAL HERNIA REPAIR  06/02/2021   Procedure: HERNIA REPAIR UMBILICAL ADULT;  Surgeon: Campbell Lerner, MD;  Location: [facility] ORS;  Service: General;;     Home Medications   Prior to Admission medications   Medication Sig Start Date End  Date Taking? Authorizing Provider  doxepin (SINEQUAN) 100 MG capsule Take 1 capsule (100 mg total) by mouth at bedtime. 05/12/21  Yes Sarina Ill, DO  FLUoxetine (PROZAC) 20 MG capsule Take 3 capsules (60 mg total) by mouth daily. 06/18/21 09/16/21 Yes Darlin Priestly, MD  folic acid (FOLVITE) 1 MG tablet Take 1 tablet (1 mg total) by mouth daily. 06/18/21  Yes Darlin Priestly, MD  gabapentin (NEURONTIN) 400 MG capsule Take 1 capsule (400 mg total) by mouth 3 (three) times daily. 05/12/21  Yes Sarina Ill, DO  lithium carbonate (LITHOBID) 300 MG CR tablet Take 300 mg by mouth 2 (two) times daily. 06/16/21  Yes [provider]  metoprolol succinate (TOPROL-XL) 50 MG 24 hr tablet Take 1 tablet (50 mg total) by mouth daily. Take with or immediately following a meal. 06/18/21 09/16/21 Yes Darlin Priestly, MD  Multiple Vitamin (MULTIVITAMIN WITH MINERALS) TABS tablet Take 1 tablet by mouth daily. 06/18/21  Yes Darlin Priestly, MD  naltrexone (DEPADE) 50 MG tablet Take 50 mg by mouth daily.   Yes [provider]  naproxen (NAPROSYN) 500 MG tablet Take 1 tablet (500 mg total) by mouth 2 (two) times daily as needed. Home med. 06/18/21 07/18/21 Yes Darlin Priestly, MD  OLANZapine (ZYPREXA) 10 MG tablet Take 1 tablet (10 mg total) by mouth at bedtime. 06/18/21 09/16/21 Yes Darlin Priestly, MD  OLANZapine (ZYPREXA) 5 MG tablet Take 1 tablet (5 mg total) by mouth daily. 06/18/21 09/16/21 Yes Darlin Priestly, MD  thiamine 100 MG tablet Take 1 tablet (100 mg total) by mouth daily. 06/18/21  Yes Darlin Priestly, MD  traZODone (DESYREL) 50 MG tablet Take 0.5 tablets (25 mg total) by mouth 3 (three) times daily as needed (Anxiety). 05/12/21  Yes Sarina Ill, DO  enalapril (VASOTEC) 10 MG tablet Hold until followup with PCP due to your acute kidney injury. Patient not taking: Reported on 06/25/2021 06/18/21   Darlin Priestly, MD     Allergies  Patient has no known allergies.   Family History   Family History  Problem Relation Age of Onset    CVA Mother        deceased at age 15   Depression Brother        Died by suicide at age 35     Physical Exam  Triage Vital Signs: ED Triage Vitals  Enc Vitals Group     BP 06/27/21 2005 107/80     Pulse Rate 06/27/21 2005 96     Resp 06/27/21 2005 18     Temp 06/27/21 2005 98.3 F (36.8 C)     Temp Source 06/27/21 2005 Oral     SpO2 06/27/21 2005 95 %     Weight 06/27/21 2006 170 lb (77.1 kg)     Height 06/27/21 2006 5\' 9"  (1.753 m)     Head Circumference --      Peak Flow --      Pain Score 06/27/21 2006 8     Pain Loc --  Pain Edu? --      Excl. in GC? --     Updated Vital Signs: BP 107/80 (BP Location: Right Arm)   Pulse 96   Temp 98.3 F (36.8 C) (Oral)   Resp 18   Ht 5\' 9"  (1.753 m)   Wt 77.1 kg   SpO2 95%   BMI 25.10 kg/m    General: Awake, no distress.  CV:  Good peripheral perfusion.  Resp:  Normal effort.  Abd:  No distention.  Other:  Flat affect.  Multiple old scars to left forearm.  Fresh superficial and linear laceration approximately 2 cm to left forearm without active bleeding.   ED Results / Procedures / Treatments  Labs (all labs ordered are listed, but only abnormal results are displayed) Labs Reviewed  COMPREHENSIVE METABOLIC PANEL - Abnormal; Notable for the following components:      Result Value   Glucose, Bld 101 (*)    BUN 29 (*)    Creatinine, Ser 1.50 (*)    Calcium 10.8 (*)    Total Protein 8.7 (*)    GFR, Estimated 54 (*)    All other components within normal limits  SALICYLATE LEVEL - Abnormal; Notable for the following components:   Salicylate Lvl <7.0 (*)    All other components within normal limits  ACETAMINOPHEN LEVEL - Abnormal; Notable for the following components:   Acetaminophen (Tylenol), Serum <10 (*)    All other components within normal limits  CBC - Abnormal; Notable for the following components:   RBC 4.18 (*)    Hemoglobin 11.8 (*)    HCT 36.9 (*)    Platelets 456 (*)    All other components within  normal limits  URINE DRUG SCREEN, QUALITATIVE ([facility] ONLY) - Abnormal; Notable for the following components:   Tricyclic, Ur Screen POSITIVE (*)    Cocaine Metabolite,Ur [place] POSITIVE (*)    All other components within normal limits  LITHIUM LEVEL - Abnormal; Notable for the following components:   Lithium Lvl <0.06 (*)    All other components within normal limits  RESP PANEL BY RT-PCR (FLU A&B, COVID) ARPGX2  ETHANOL     EKG  None   RADIOLOGY None   Official radiology report(s): No results found.   PROCEDURES:  Critical Care performed: No  ..Laceration Repair  Date/Time: 06/27/2021 11:35 AM  Performed by: 06/29/2021, MD Authorized by: Irean Hong, MD   Consent:    Consent obtained:  Verbal   Consent given by:  Patient   Risks, benefits, and alternatives were discussed: yes     Risks discussed:  Infection, pain, need for additional repair, poor cosmetic result and poor wound healing Universal protocol:    Procedure explained and questions answered to patient or proxy's satisfaction: yes     Relevant documents present and verified: yes     Immediately prior to procedure, a time out was called: yes     Patient identity confirmed:  Verbally with patient Anesthesia:    Anesthesia method:  None Laceration details:    Location:  Shoulder/arm   Shoulder/arm location:  L lower arm   Length (cm):  2   Depth (mm):  2 Pre-procedure details:    Preparation:  Patient was prepped and draped in usual sterile fashion Treatment:    Area cleansed with:  Povidone-iodine   Amount of cleaning:  Standard Skin repair:    Repair method: Derma-clip. Approximation:    Approximation:  Close Repair type:  Repair type:  Simple Post-procedure details:    Procedure completion:  Tolerated well, no immediate complications    MEDICATIONS ORDERED IN ED: Medications - No data to display   IMPRESSION / MDM / ASSESSMENT AND PLAN / ED COURSE  I reviewed the triage vital signs and  the nursing notes.                             61 year old male brought to the ED under IVC for PTSD and self-harm behavior.  Laboratory results demonstrate improved creatinine compared to 3 days prior, cocaine metabolites.  Superficial laceration treated with Betadine cleanse and derma clip.  Lithium level was added which was negative.  The patient has been placed in psychiatric observation due to the need to provide a safe environment for the patient while obtaining psychiatric consultation and evaluation, as well as ongoing medical and medication management to treat the patient's condition.  The patient has been placed under full IVC at this time.  Patient moved to [facility].   FINAL CLINICAL IMPRESSION(S) / ED DIAGNOSES   Final diagnoses:  Cocaine use  Laceration of left forearm, initial encounter  At risk for intentional self-harm  Depression, unspecified depression type     Rx / DC Orders   ED Discharge Orders     None        Note:  This document was prepared using Dragon voice recognition software and may include unintentional dictation errors.   Irean Hong, MD 06/28/21 Aretha Parrot

## 2021-06-27 NOTE — ED Notes (Signed)
Pt dressed out into hospital scrubs. Pt belongings placed into hospital bag and labeled appropriately.  Items include 1 silver ring placed into specimen cup and labeled, placed in hospital bag 1 pair pants with belt 1 grey top 1 pair underwear 1 pair black shoes 1 lunchbox with multiple items including medications (Enalapril) and scissors, wallet with no cash or cards

## 2021-06-27 NOTE — ED Notes (Signed)
Pt. Transferred to [facility] from ED to room 3 after screening for contraband. Report to include Situation, Background, Assessment and Recommendations from [facility]. Pt. Oriented to unit including Q15 minute rounds as well as the security cameras for their protection. Patient is alert and oriented, warm and dry in no acute distress. Patient reported SI with a plan. He contracted for safety. He denied HI, and AVH. Pt. Encouraged to let me know if needs arise.

## 2021-06-27 NOTE — ED Triage Notes (Signed)
Pt initially voluntary but is now IVC. Escorted by [employer]. Pt was voluntary. States " I'm tired of having these flashbacks. I dont want to live." Pr reportedly having flashbacks to brothers suicide. Pt has multiple cuts to left forearm which he states were and attempt to harm self. Pt has wound to left forearm wrapped with guaze dressing, bleeding controlled. Due to attempts at self harm, pt is now IVC. Pt admits to cocaine use about 9am this morning. States he is not a regular drug user. Pt calm and cooperative in triage.

## 2021-06-28 DIAGNOSIS — Z765 Malingerer [conscious simulation]: Secondary | ICD-10-CM

## 2021-06-28 DIAGNOSIS — S51812A Laceration without foreign body of left forearm, initial encounter: Secondary | ICD-10-CM | POA: Insufficient documentation

## 2021-06-28 DIAGNOSIS — F331 Major depressive disorder, recurrent, moderate: Secondary | ICD-10-CM

## 2021-06-28 DIAGNOSIS — F1490 Cocaine use, unspecified, uncomplicated: Secondary | ICD-10-CM

## 2021-06-28 MED ORDER — FLUOXETINE HCL 20 MG PO CAPS
60.0000 mg | ORAL_CAPSULE | Freq: Every day | ORAL | Status: DC
  Administered 2021-06-28 – 2021-06-29 (×2): 60 mg via ORAL
  Filled 2021-06-28 (×2): qty 3

## 2021-06-28 MED ORDER — ADULT MULTIVITAMIN W/MINERALS CH
1.0000 | ORAL_TABLET | Freq: Every day | ORAL | Status: DC
  Administered 2021-06-28 – 2021-06-29 (×2): 1 via ORAL
  Filled 2021-06-28 (×2): qty 1

## 2021-06-28 MED ORDER — THIAMINE HCL 100 MG PO TABS
100.0000 mg | ORAL_TABLET | Freq: Every day | ORAL | Status: DC
  Administered 2021-06-28 – 2021-06-29 (×2): 100 mg via ORAL
  Filled 2021-06-28 (×2): qty 1

## 2021-06-28 MED ORDER — METOPROLOL SUCCINATE ER 50 MG PO TB24
50.0000 mg | ORAL_TABLET | Freq: Every day | ORAL | Status: DC
  Administered 2021-06-28 – 2021-06-29 (×2): 50 mg via ORAL
  Filled 2021-06-28 (×2): qty 1

## 2021-06-28 MED ORDER — OLANZAPINE 10 MG PO TABS
10.0000 mg | ORAL_TABLET | Freq: Every day | ORAL | Status: DC
Start: 2021-06-28 — End: 2021-06-29
  Administered 2021-06-28: 10 mg via ORAL
  Filled 2021-06-28: qty 1

## 2021-06-28 MED ORDER — NALTREXONE HCL 50 MG PO TABS
50.0000 mg | ORAL_TABLET | Freq: Every day | ORAL | Status: DC
  Administered 2021-06-28 – 2021-06-29 (×2): 50 mg via ORAL
  Filled 2021-06-28 (×2): qty 1

## 2021-06-28 MED ORDER — GABAPENTIN 300 MG PO CAPS
400.0000 mg | ORAL_CAPSULE | Freq: Three times a day (TID) | ORAL | Status: DC
  Administered 2021-06-28 – 2021-06-29 (×4): 400 mg via ORAL
  Filled 2021-06-28 (×4): qty 1

## 2021-06-28 MED ORDER — DOXEPIN HCL 100 MG PO CAPS
100.0000 mg | ORAL_CAPSULE | Freq: Every day | ORAL | Status: DC
  Administered 2021-06-28: 100 mg via ORAL
  Filled 2021-06-28 (×2): qty 1

## 2021-06-28 MED ORDER — OLANZAPINE 5 MG PO TABS
5.0000 mg | ORAL_TABLET | Freq: Every day | ORAL | Status: DC
  Administered 2021-06-28 – 2021-06-29 (×2): 5 mg via ORAL
  Filled 2021-06-28 (×2): qty 1

## 2021-06-28 MED ORDER — FOLIC ACID 1 MG PO TABS
1.0000 mg | ORAL_TABLET | Freq: Every day | ORAL | Status: DC
  Administered 2021-06-28 – 2021-06-29 (×2): 1 mg via ORAL
  Filled 2021-06-28 (×2): qty 1

## 2021-06-28 MED ORDER — LITHIUM CARBONATE ER 300 MG PO TBCR
300.0000 mg | EXTENDED_RELEASE_TABLET | Freq: Two times a day (BID) | ORAL | Status: DC
Start: 2021-06-28 — End: 2021-06-29
  Administered 2021-06-28 – 2021-06-29 (×3): 300 mg via ORAL
  Filled 2021-06-28 (×3): qty 1

## 2021-06-28 NOTE — ED Provider Notes (Signed)
Emergency Medicine Observation Re-evaluation Note  Ever Colon Kittell is a 61 y.o. male, seen on rounds today.  Pt initially presented to the ED for complaints of Mental Health Problem  Currently, the patient is resting in bed   Physical Exam  Blood pressure 107/80, pulse 96, temperature 98.3 F (36.8 C), temperature source Oral, resp. rate 18, height 5\' 9"  (1.753 m), weight 77.1 kg, SpO2 95 %.  Physical Exam Physical Exam General: No apparent distress Pulm: Normal WOB Neuro: resting in bed   ED Course / MDM     I have reviewed the labs performed to date as well as medications administered while in observation.  Recent changes in the last 24 hours include none  Plan   Current plan is to continue to wait for psych plan/placement if felt warranted  Patient is under full IVC at this time.   , MD 06/28/21 [phone]

## 2021-06-28 NOTE — ED Notes (Signed)
Patient resting quietly in room. No noted distress or abnormal behaviors noted. Will continue 15 minute checks and observation by security camera for safety. 

## 2021-06-28 NOTE — ED Notes (Signed)
INVOLUNTARY consult completed by NP Barthold who recommends inpatient psych admit

## 2021-06-28 NOTE — Consult Note (Signed)
[facility] Face-to-Face Psychiatry Consult   Reason for Consult:  Psych eval Referring Physician:  Dr. Dolores Frame Patient Identification: [name] MRN:  [id number] Principal Diagnosis: Malingering Diagnosis:  Principal Problem:   Malingering   Total Time spent with patient: 20 minutes  Subjective:   Malingering  HPI:  [name], 61 y.o., male patient by this provider, consulted with Dr. Dolores Frame; and chart reviewed on 06/28/21.  Per chart review, patient was seen and discharged from this ER by psychiatry.  Her recommendation was as follows:   61 yo male who was transferred to [facility] from [facility] after they secured a bed for him at [facility].  He told them he had suicidal ideations which unfortunately is his baseline, chronically suicidal.  In the paperwork, [facility] noted he was IDD and the bed was resended and given to another client as this was exclusionary criteria.  Graycen was discharged from the geriatric unit on Tuesday and unfortunately returned to using cocaine.  He then went to [facility].  Demarkus receives monies monthly and unfortunately uses them on cocaine vs housing.  Today, he is at his baseline with no suicidal plans or intent, chronic fleeting suicidal ideations.  No homicidal ideations, psychosis, or withdrawal symptoms.  Psychiatrically stable and discharged.  Per Triage note, pt was voluntary. States " I'm tired of having these flashbacks. I dont want to live." Pr reportedly having flashbacks to brothers suicide. Pt has multiple cuts to left forearm which he states were and attempt to harm self. Pt has wound to left forearm wrapped with guaze dressing, bleeding controlled. Due to attempts at self harm, pt is now IVC. Pt admits to cocaine use about 9am this morning. States he is not a regular drug user. Pt calm and cooperative in triage.  Rescind IVC After thorough evaluation and review of information currently presented on assessment of Donyae Colon Mucci (respondent), there is insufficient  findings to indicate respondent meets criteria for involuntary commitment or require an inpatient level of care.  There is no indication that the respondent is currently responding to internal/external stimuli or experiencing delusional thought content; and respondent has denied suicidal/self-harm/homicidal ideation, psychosis, and paranoia.   Respondent presents to er for secondary gain of admission as he is homeless due to chronic drug abuse.  Currently respondent is not significantly impaired, psychotic, or manic on exam.  A detailed risk assessment has been completed based on clinical exam and individual risk factors.  There is no evidence of imminent risk to self or others at present and respondent does not meet criteria for psychiatric inpatient admission.   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 echo 5/17: EF 60-65%, no RWMA, LV dias fxn nl, LA mildly dilated, RV sys fxn nl, PASP nl, moderate sized circumferential pericardial effusion was identified, 2.12 cm around the LV free wall, <1 cm around the RV free wall. Features were not c/w tamponade  physiology   PTSD (post-traumatic stress disorder)    Witnessed brother's suicide.   Restless leg syndrome    Seizures (HCC)    Syncope     Past Surgical History:  Procedure Laterality Date   CYSTOSCOPY WITH URETEROSCOPY AND STENT PLACEMENT     ESOPHAGOGASTRODUODENOSCOPY N/A 01/11/2016   Procedure: ESOPHAGOGASTRODUODENOSCOPY (EGD);  Surgeon: Charlott Rakes, MD;  Location: [facility]  ENDOSCOPY;  Service: Endoscopy;  Laterality: N/A;   ESOPHAGOGASTRODUODENOSCOPY N/A 04/09/2020   Procedure: ESOPHAGOGASTRODUODENOSCOPY (EGD);  Surgeon: [name], MD;  Location: [facility] ENDOSCOPY;  Service: Gastroenterology;  Laterality: N/A;   INCISION AND DRAINAGE ABSCESS Left 01/02/2018   Procedure: INCISION AND DRAINAGE LEFT KNEE;  Surgeon: Deeann Saint, MD;  Location: [facility] ORS;  Service: Orthopedics;  Laterality: Left;   JOINT REPLACEMENT Right    TKR   KNEE ARTHROSCOPY Right 06/25/2014   Procedure: ARTHROSCOPY KNEE;  Surgeon: Deeann Saint, MD;  Location: [facility] ORS;  Service: Orthopedics;  Laterality: Right;  partial arthroscopic medial menisectomy   LAPAROSCOPIC APPENDECTOMY N/A 06/02/2021   Procedure: APPENDECTOMY LAPAROSCOPIC;  Surgeon: Campbell Lerner, MD;  Location: [facility] ORS;  Service: General;  Laterality: N/A;   TOTAL KNEE ARTHROPLASTY Right 04/22/2015   Procedure: TOTAL KNEE ARTHROPLASTY;  Surgeon: Deeann Saint, MD;  Location: [facility] ORS;  Service: Orthopedics;  Laterality: Right;   TOTAL KNEE ARTHROPLASTY Left 10/30/2017   Procedure: TOTAL KNEE ARTHROPLASTY;  Surgeon: Deeann Saint, MD;  Location: [facility] ORS;  Service: Orthopedics;  Laterality: Left;   TOTAL KNEE REVISION Left 01/02/2018   Procedure: poly exchange of tibia and patella left knee;  Surgeon: Deeann Saint, MD;  Location: [facility] ORS;  Service: Orthopedics;  Laterality: Left;   UMBILICAL HERNIA REPAIR  06/02/2021   Procedure: HERNIA REPAIR UMBILICAL ADULT;  Surgeon: Campbell Lerner, MD;  Location: [facility] ORS;  Service: General;;   Family History:  Family History  Problem Relation Age of Onset   CVA Mother        deceased at age 54   Depression Brother        Died by suicide at age 41   Family Psychiatric  History: unknown Social History:  Social History   Substance and Sexual Activity  Alcohol Use Yes   Comment: rare     Social History   Substance and Sexual Activity  Drug Use Yes   Types: Marijuana, Cocaine     Social History   Socioeconomic History   Marital status: Single    Spouse name: Not on file   Number of children: Not on file   Years of education: Not on file   Highest education level: Not on file  Occupational History   Not on file  Tobacco Use   Smoking status: Former    Packs/day: 0.75    Years: 20.00    Total pack years: 15.00    Types: Cigarettes    Quit date: 05/16/1984    Years since quitting: 37.1   Smokeless tobacco: Never  Vaping Use   Vaping Use: Never used  Substance and Sexual Activity   Alcohol use: Yes    Comment: rare   Drug use: Yes    Types: Marijuana, Cocaine   Sexual activity: Not on file  Other Topics Concern   Not on file  Social History Narrative   Not on file   Social Determinants of Health   Financial Resource Strain: Not on file  Food Insecurity: Not on file  Transportation Needs: Not on file  Physical Activity: Not on file  Stress:  Not on file  Social Connections: Not on file   Additional Social History:    Allergies:  No Known Allergies  Labs:  Results for orders placed or performed during the hospital encounter of 06/27/21 (from the past 48 hour(s))  Comprehensive metabolic panel     Status: Abnormal   Collection Time: 06/27/21  8:09 PM  Result Value Ref Range   Sodium 137 135 - 145 mmol/L   Potassium 4.5 3.5 - 5.1 mmol/L   Chloride 105 98 - 111 mmol/L   CO2 23 22 - 32 mmol/L   Glucose, Bld 101 (H) 70 - 99 mg/dL    Comment: Glucose reference range applies only to samples taken after fasting for at least 8 hours.   BUN 29 (H) 6 - 20 mg/dL   Creatinine, Ser 1.611.50 (H) 0.61 - 1.24 mg/dL   Calcium 09.610.8 (H) 8.9 - 10.3 mg/dL   Total Protein 8.7 (H) 6.5 - 8.1 g/dL   Albumin 4.1 3.5 - 5.0 g/dL   AST 19 15 - 41 U/L   ALT 23 0 - 44 U/L   Alkaline Phosphatase 79 38 - 126 U/L   Total Bilirubin 0.7 0.3 - 1.2 mg/dL   GFR, Estimated 54 (L) >60 mL/min    Comment: (NOTE) Calculated using the CKD-EPI Creatinine Equation (2021)     Anion gap 9 5 - 15    Comment: Performed at [facility], [address] [place], [place] [address]  Ethanol     Status: None   Collection Time: 06/27/21  8:09 PM  Result Value Ref Range   Alcohol, Ethyl (B) <10 <10 mg/dL    Comment: (NOTE) Lowest detectable limit for serum alcohol is 10 mg/dL.  For medical purposes only. Performed at [facility], [address] [place], [place] [address]   Salicylate level     Status: Abnormal   Collection Time: 06/27/21  8:09 PM  Result Value Ref Range   Salicylate Lvl <7.0 (L) 7.0 - 30.0 mg/dL    Comment: Performed at [facility], [address] [place], [place] [address]  Acetaminophen level     Status: Abnormal   Collection Time: 06/27/21  8:09 PM  Result Value Ref Range   Acetaminophen (Tylenol), Serum <10 (L) 10 - 30 ug/mL    Comment: (NOTE) Therapeutic concentrations vary significantly. A range of 10-30 ug/mL  may be an effective concentration for many patients. However, some  are best treated at concentrations outside of this range. Acetaminophen concentrations >150 ug/mL at 4 hours after ingestion  and >50 ug/mL at 12 hours after ingestion are often associated with  toxic reactions.  Performed at [facility], [address] [place], [place] [address]   cbc     Status: Abnormal   Collection Time: 06/27/21  8:09 PM  Result Value Ref Range   WBC 6.8 4.0 - 10.5 K/uL   RBC 4.18 (L) 4.22 - 5.81 MIL/uL   Hemoglobin 11.8 (L) 13.0 - 17.0 g/dL   HCT 30.836.9 (L) 65.739.0 - 84.652.0 %   MCV 88.3 80.0 - 100.0 fL   MCH 28.2 26.0 - 34.0 pg   MCHC 32.0 30.0 - 36.0 g/dL   RDW 96.215.1 95.211.5 - 84.115.5 %   Platelets 456 (H) 150 - 400 K/uL   nRBC 0.0 0.0 - 0.2 %    Comment: Performed at [facility], [address] [place], [place] [address]  Urine Drug Screen, Qualitative     Status: Abnormal  Collection Time: 06/27/21  8:09 PM  Result Value Ref Range   Tricyclic, Ur Screen POSITIVE (A) NONE DETECTED    Amphetamines, Ur Screen NONE DETECTED NONE DETECTED   MDMA (Ecstasy)Ur Screen NONE DETECTED NONE DETECTED   Cocaine Metabolite,Ur Sunburst POSITIVE (A) NONE DETECTED   Opiate, Ur Screen NONE DETECTED NONE DETECTED   Phencyclidine (PCP) Ur S NONE DETECTED NONE DETECTED   Cannabinoid 50 Ng, Ur  NONE DETECTED NONE DETECTED   Barbiturates, Ur Screen NONE DETECTED NONE DETECTED   Benzodiazepine, Ur Scrn NONE DETECTED NONE DETECTED   Methadone Scn, Ur NONE DETECTED NONE DETECTED    Comment: (NOTE) Tricyclics + metabolites, urine    Cutoff 1000 ng/mL Amphetamines + metabolites, urine  Cutoff 1000 ng/mL MDMA (Ecstasy), urine              Cutoff 500 ng/mL Cocaine Metabolite, urine          Cutoff 300 ng/mL Opiate + metabolites, urine        Cutoff 300 ng/mL Phencyclidine (PCP), urine         Cutoff 25 ng/mL Cannabinoid, urine                 Cutoff 50 ng/mL Barbiturates + metabolites, urine  Cutoff 200 ng/mL Benzodiazepine, urine              Cutoff 200 ng/mL Methadone, urine                   Cutoff 300 ng/mL  The urine drug screen provides only a preliminary, unconfirmed analytical test result and should not be used for non-medical purposes. Clinical consideration and professional judgment should be applied to any positive drug screen result due to possible interfering substances. A more specific alternate chemical method must be used in order to obtain a confirmed analytical result. Gas chromatography / mass spectrometry (GC/MS) is the preferred confirm atory method. Performed at [facility], [address] [place], [place] [address]   Resp Panel by RT-PCR (Flu A&B, Covid) Anterior Nasal Swab     Status: None   Collection Time: 06/27/21  8:09 PM   Specimen: Anterior Nasal Swab  Result Value Ref Range   SARS Coronavirus 2 by RT PCR NEGATIVE NEGATIVE    Comment: (NOTE) SARS-CoV-2 target nucleic acids are NOT DETECTED.  The SARS-CoV-2 RNA is generally detectable in upper  respiratory specimens during the acute phase of infection. The lowest concentration of SARS-CoV-2 viral copies this assay can detect is 138 copies/mL. A negative result does not preclude SARS-Cov-2 infection and should not be used as the sole basis for treatment or other patient management decisions. A negative result may occur with  improper specimen collection/handling, submission of specimen other than nasopharyngeal swab, presence of viral mutation(s) within the areas targeted by this assay, and inadequate number of viral copies(<138 copies/mL). A negative result must be combined with clinical observations, patient history, and epidemiological information. The expected result is Negative.  Fact Sheet for Patients:  BloggerCourse.com  Fact Sheet for Healthcare Providers:  SeriousBroker.it  This test is no t yet approved or cleared by the [place] FDA and  has been authorized for detection and/or diagnosis of SARS-CoV-2 by FDA under an Emergency Use Authorization (EUA). This EUA will remain  in effect (meaning this test can be used) for the duration of the COVID-19 declaration under Section 564(b)(1) of the Act, 21 U.S.C.section 360bbb-3(b)(1), unless the authorization is terminated  or revoked sooner.  Influenza A by PCR NEGATIVE NEGATIVE   Influenza B by PCR NEGATIVE NEGATIVE    Comment: (NOTE) The Xpert Xpress SARS-CoV-2/FLU/RSV plus assay is intended as an aid in the diagnosis of influenza from Nasopharyngeal swab specimens and should not be used as a sole basis for treatment. Nasal washings and aspirates are unacceptable for Xpert Xpress SARS-CoV-2/FLU/RSV testing.  Fact Sheet for Patients: BloggerCourse.com  Fact Sheet for Healthcare Providers: SeriousBroker.it  This test is not yet approved or cleared by the [place] FDA and has been authorized for  detection and/or diagnosis of SARS-CoV-2 by FDA under an Emergency Use Authorization (EUA). This EUA will remain in effect (meaning this test can be used) for the duration of the COVID-19 declaration under Section 564(b)(1) of the Act, 21 U.S.C. section 360bbb-3(b)(1), unless the authorization is terminated or revoked.  Performed at [facility], [address] [place], [place] [address]   Lithium level     Status: Abnormal   Collection Time: 06/27/21  8:20 PM  Result Value Ref Range   Lithium Lvl <0.06 (L) 0.60 - 1.20 mmol/L    Comment: Performed at [facility], [address], [place], [place] [address]    Current Facility-Administered Medications  Medication Dose Route Frequency Provider Last Rate Last Admin   doxepin (SINEQUAN) capsule 100 mg  100 mg Oral QHS Irean Hong, MD       FLUoxetine (PROZAC) capsule 60 mg  60 mg Oral Daily Irean Hong, MD       folic acid (FOLVITE) tablet 1 mg  1 mg Oral Daily Irean Hong, MD       gabapentin (NEURONTIN) capsule 400 mg  400 mg Oral TID Irean Hong, MD       lithium carbonate (LITHOBID) CR tablet 300 mg  300 mg Oral BID Irean Hong, MD       metoprolol succinate (TOPROL-XL) 24 hr tablet 50 mg  50 mg Oral Q breakfast Irean Hong, MD       multivitamin with minerals tablet 1 tablet  1 tablet Oral Daily Irean Hong, MD       naltrexone (DEPADE) tablet 50 mg  50 mg Oral Daily Irean Hong, MD       OLANZapine (ZYPREXA) tablet 10 mg  10 mg Oral QHS Irean Hong, MD       OLANZapine [facility]) tablet 5 mg  5 mg Oral Daily Irean Hong, MD       thiamine tablet 100 mg  100 mg Oral Daily Irean Hong, MD       Current Outpatient Medications  Medication Sig Dispense Refill   doxepin (SINEQUAN) 100 MG capsule Take 1 capsule (100 mg total) by mouth at bedtime. 30 capsule 3   FLUoxetine (PROZAC) 20 MG capsule Take 3 capsules (60 mg total) by mouth daily. 90 capsule 2   folic acid (FOLVITE) 1 MG tablet Take 1 tablet (1 mg  total) by mouth daily.     gabapentin (NEURONTIN) 400 MG capsule Take 1 capsule (400 mg total) by mouth 3 (three) times daily. 90 capsule 3   lithium carbonate (LITHOBID) 300 MG CR tablet Take 300 mg by mouth 2 (two) times daily.     metoprolol succinate (TOPROL-XL) 50 MG 24 hr tablet Take 1 tablet (50 mg total) by mouth daily. Take with or immediately following a meal. 30 tablet 2   Multiple Vitamin (MULTIVITAMIN WITH MINERALS) TABS tablet Take 1 tablet  by mouth daily.     naltrexone (DEPADE) 50 MG tablet Take 50 mg by mouth daily.     naproxen (NAPROSYN) 500 MG tablet Take 1 tablet (500 mg total) by mouth 2 (two) times daily as needed. Home med. 60 tablet 0   OLANZapine (ZYPREXA) 10 MG tablet Take 1 tablet (10 mg total) by mouth at bedtime. 30 tablet 2   OLANZapine (ZYPREXA) 5 MG tablet Take 1 tablet (5 mg total) by mouth daily. 30 tablet 2   thiamine 100 MG tablet Take 1 tablet (100 mg total) by mouth daily.     traZODone (DESYREL) 50 MG tablet Take 0.5 tablets (25 mg total) by mouth 3 (three) times daily as needed (Anxiety). 90 tablet 3   enalapril (VASOTEC) 10 MG tablet Hold until followup with PCP due to your acute kidney injury. (Patient not taking: Reported on 06/25/2021) 30 tablet 0    Musculoskeletal: Strength & Muscle Tone: within normal limits Gait & Station: normal Patient leans: N/A            Psychiatric Specialty Exam:  Presentation  General Appearance: Casual; Neat  Eye Contact:Fair  Speech:Normal Rate  Speech Volume:Decreased  Handedness:Right   Mood and Affect  Mood:Anxious; Depressed; Hopeless  Affect:Appropriate; Constricted   Thought Process  Thought Processes:Coherent  Descriptions of Associations:Circumstantial  Orientation:Full (Time, Place and Person)  Thought Content:Logical  History of Schizophrenia/Schizoaffective disorder:No  Duration of Psychotic Symptoms:N/A  Hallucinations:No data recorded Ideas of  Reference:None  Suicidal Thoughts:No data recorded Homicidal Thoughts:No data recorded  Sensorium  Memory:Immediate Fair; Remote Good  Judgment:Fair  Insight:Fair   Executive Functions  Concentration:Fair  Attention Span:Fair  Recall:Fair  Fund of Knowledge:Fair  Language:Fair   Psychomotor Activity  Psychomotor Activity:No data recorded  Assets  Assets:Desire for Improvement; Communication Skills   Sleep  Sleep:No data recorded  Physical Exam: Physical Exam ROS Blood pressure 107/80, pulse 96, temperature 98.3 F (36.8 C), temperature source Oral, resp. rate 18, height  (1.753 m), weight 77.1 kg, SpO2 95 %. Body mass index is 25.1 kg/m.    Disposition: No evidence of imminent risk to self or others at present.   Patient does not meet criteria for psychiatric inpatient admission. Discussed crisis plan, support from social network, calling 911, coming to the Emergency Department, and calling Suicide Hotline.  Jearld Lesch, NP 06/28/2021 5:28 AM

## 2021-06-28 NOTE — Consult Note (Signed)
[facility] Psych ED Progress Note  06/28/2021 11:50 AM [name]  MRN:  [id number]   Method of visit?: Face to Face   Subjective:  "I just don't want to live anymore"  On re-assessment this morning, patient has very flat affect, depressed. He has numerous self-inflicted superficial cuts on his left forearm. He states that he is still suicidal and if he gets discharged he will likely try to kill himself. He requesting inpatient psychiatric care.   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 echo 5/17: EF 60-65%, no RWMA, LV dias fxn nl, LA mildly dilated, RV sys fxn nl, PASP nl, moderate sized circumferential pericardial effusion was identified, 2.12 cm around the LV free wall, <1 cm around the RV free wall. Features were not c/w tamponade physiology   PTSD (post-traumatic stress disorder)    Witnessed brother's suicide.   Restless leg syndrome    Seizures (HCC)    Syncope     Past Surgical History:  Procedure Laterality Date   CYSTOSCOPY WITH URETEROSCOPY AND STENT PLACEMENT     ESOPHAGOGASTRODUODENOSCOPY N/A 01/11/2016   Procedure: ESOPHAGOGASTRODUODENOSCOPY (EGD);  Surgeon: Wilford Corner, MD;  Location: [facility] ENDOSCOPY;  Service: Endoscopy;  Laterality: N/A;   ESOPHAGOGASTRODUODENOSCOPY N/A 04/09/2020   Procedure: ESOPHAGOGASTRODUODENOSCOPY (EGD);  Surgeon: Jonathon Bellows, MD;  Location: [facility] ENDOSCOPY;  Service: Gastroenterology;  Laterality: N/A;   INCISION AND DRAINAGE ABSCESS Left 01/02/2018   Procedure: INCISION AND DRAINAGE LEFT KNEE;  Surgeon: Earnestine Leys, MD;  Location: [facility] ORS;  Service: Orthopedics;  Laterality: Left;   JOINT REPLACEMENT Right    TKR   KNEE ARTHROSCOPY Right 06/25/2014   Procedure: ARTHROSCOPY KNEE;  Surgeon: Earnestine Leys, MD;  Location: [facility] ORS;  Service: Orthopedics;  Laterality: Right;  partial arthroscopic medial menisectomy   LAPAROSCOPIC APPENDECTOMY N/A 06/02/2021   Procedure: APPENDECTOMY LAPAROSCOPIC;  Surgeon: Ronny Bacon, MD;  Location: [facility] ORS;  Service: General;  Laterality: N/A;   TOTAL KNEE ARTHROPLASTY Right 04/22/2015   Procedure: TOTAL KNEE ARTHROPLASTY;  Surgeon: Earnestine Leys, MD;  Location: [facility] ORS;  Service: Orthopedics;  Laterality: Right;   TOTAL KNEE ARTHROPLASTY Left 10/30/2017   Procedure: TOTAL KNEE ARTHROPLASTY;  Surgeon: Earnestine Leys, MD;  Location: [facility] ORS;  Service: Orthopedics;  Laterality: Left;   TOTAL KNEE REVISION Left 01/02/2018   Procedure: poly exchange of tibia and patella left knee;  Surgeon: Earnestine Leys, MD;  Location: [facility] ORS;  Service: Orthopedics;  Laterality: Left;   UMBILICAL HERNIA REPAIR  06/02/2021   Procedure: HERNIA REPAIR UMBILICAL ADULT;  Surgeon: Ronny Bacon, MD;  Location: [facility] ORS;  Service: General;;   Family History:  Family History  Problem Relation Age of Onset   CVA Mother        deceased at age 12   Depression Brother  Died by suicide at age 62   Family Psychiatric  History: see previous Social History:  Social History   Substance and Sexual Activity  Alcohol Use Yes   Comment: rare     Social History   Substance and Sexual Activity  Drug Use Yes   Types: Marijuana, Cocaine    Social History   Socioeconomic History   Marital status: Single    Spouse name: Not on file   Number of children: Not on file   Years of  education: Not on file   Highest education level: Not on file  Occupational History   Not on file  Tobacco Use   Smoking status: Former    Packs/day: 0.75    Years: 20.00    Total pack years: 15.00    Types: Cigarettes    Quit date: 05/16/1984    Years since quitting: 37.1   Smokeless tobacco: Never  Vaping Use   Vaping Use: Never used  Substance and Sexual Activity   Alcohol use: Yes    Comment: rare   Drug use: Yes    Types: Marijuana, Cocaine   Sexual activity: Not on file  Other Topics Concern   Not on file  Social History Narrative   Not on file   Social Determinants of Health   Financial Resource Strain: Not on file  Food Insecurity: Not on file  Transportation Needs: Not on file  Physical Activity: Not on file  Stress: Not on file  Social Connections: Not on file    Sleep: Good  Appetite:  Fair  Current Medications: Current Facility-Administered Medications  Medication Dose Route Frequency Provider Last Rate Last Admin   doxepin (SINEQUAN) capsule 100 mg  100 mg Oral QHS Irean Hong, MD       FLUoxetine (PROZAC) capsule 60 mg  60 mg Oral Daily Irean Hong, MD   60 mg at 06/28/21 1046   folic acid (FOLVITE) tablet 1 mg  1 mg Oral Daily Irean Hong, MD   1 mg at 06/28/21 1046   gabapentin (NEURONTIN) capsule 400 mg  400 mg Oral TID Irean Hong, MD   400 mg at 06/28/21 1045   lithium carbonate (LITHOBID) CR tablet 300 mg  300 mg Oral BID Irean Hong, MD   300 mg at 06/28/21 1045   metoprolol succinate (TOPROL-XL) 24 hr tablet 50 mg  50 mg Oral Q breakfast Irean Hong, MD   50 mg at 06/28/21 1045   multivitamin with minerals tablet 1 tablet  1 tablet Oral Daily Irean Hong, MD   1 tablet at 06/28/21 1044   naltrexone (DEPADE) tablet 50 mg  50 mg Oral Daily Irean Hong, MD   50 mg at 06/28/21 1046   OLANZapine (ZYPREXA) tablet 10 mg  10 mg Oral QHS Irean Hong, MD       OLANZapine (ZYPREXA) tablet 5 mg  5 mg Oral Daily Irean Hong, MD   5 mg at 06/28/21  1045   thiamine tablet 100 mg  100 mg Oral Daily Irean Hong, MD   100 mg at 06/28/21 1045   Current Outpatient Medications  Medication Sig Dispense Refill   doxepin (SINEQUAN) 100 MG capsule Take 1 capsule (100 mg total) by mouth at bedtime. 30 capsule 3   FLUoxetine (PROZAC) 20 MG capsule Take 3 capsules (60 mg total) by mouth daily. 90 capsule 2   folic acid (FOLVITE) 1 MG tablet Take 1 tablet (  1 mg total) by mouth daily.     gabapentin (NEURONTIN) 400 MG capsule Take 1 capsule (400 mg total) by mouth 3 (three) times daily. 90 capsule 3   lithium carbonate (LITHOBID) 300 MG CR tablet Take 300 mg by mouth 2 (two) times daily.     metoprolol succinate (TOPROL-XL) 50 MG 24 hr tablet Take 1 tablet (50 mg total) by mouth daily. Take with or immediately following a meal. 30 tablet 2   Multiple Vitamin (MULTIVITAMIN WITH MINERALS) TABS tablet Take 1 tablet by mouth daily.     naltrexone (DEPADE) 50 MG tablet Take 50 mg by mouth daily.     naproxen (NAPROSYN) 500 MG tablet Take 1 tablet (500 mg total) by mouth 2 (two) times daily as needed. Home med. 60 tablet 0   OLANZapine (ZYPREXA) 10 MG tablet Take 1 tablet (10 mg total) by mouth at bedtime. 30 tablet 2   OLANZapine (ZYPREXA) 5 MG tablet Take 1 tablet (5 mg total) by mouth daily. 30 tablet 2   thiamine 100 MG tablet Take 1 tablet (100 mg total) by mouth daily.     traZODone (DESYREL) 50 MG tablet Take 0.5 tablets (25 mg total) by mouth 3 (three) times daily as needed (Anxiety). 90 tablet 3   enalapril (VASOTEC) 10 MG tablet Hold until followup with PCP due to your acute kidney injury. (Patient not taking: Reported on 06/25/2021) 30 tablet 0    Lab Results:  Results for orders placed or performed during the hospital encounter of 06/27/21 (from the past 48 hour(s))  Comprehensive metabolic panel     Status: Abnormal   Collection Time: 06/27/21  8:09 PM  Result Value Ref Range   Sodium 137 135 - 145 mmol/L   Potassium 4.5 3.5 - 5.1 mmol/L    Chloride 105 98 - 111 mmol/L   CO2 23 22 - 32 mmol/L   Glucose, Bld 101 (H) 70 - 99 mg/dL    Comment: Glucose reference range applies only to samples taken after fasting for at least 8 hours.   BUN 29 (H) 6 - 20 mg/dL   Creatinine, Ser 1.50 (H) 0.61 - 1.24 mg/dL   Calcium 10.8 (H) 8.9 - 10.3 mg/dL   Total Protein 8.7 (H) 6.5 - 8.1 g/dL   Albumin 4.1 3.5 - 5.0 g/dL   AST 19 15 - 41 U/L   ALT 23 0 - 44 U/L   Alkaline Phosphatase 79 38 - 126 U/L   Total Bilirubin 0.7 0.3 - 1.2 mg/dL   GFR, Estimated 54 (L) >60 mL/min    Comment: (NOTE) Calculated using the CKD-EPI Creatinine Equation (2021)    Anion gap 9 5 - 15    Comment: Performed at [facility], [address] [place], [place] [address]  Ethanol     Status: None   Collection Time: 06/27/21  8:09 PM  Result Value Ref Range   Alcohol, Ethyl (B) <10 <10 mg/dL    Comment: (NOTE) Lowest detectable limit for serum alcohol is 10 mg/dL.  For medical purposes only. Performed at [facility], [address] [place], [place] [id number]   Salicylate level     Status: Abnormal   Collection Time: 06/27/21  8:09 PM  Result Value Ref Range   Salicylate Lvl [id number] (L) 7.0 - 30.0 mg/dL    Comment: Performed at [facility], [address] [place], [place] [address]  Acetaminophen level     Status: Abnormal   Collection Time:  06/27/21  8:09 PM  Result Value Ref Range   Acetaminophen (Tylenol), Serum <10 (L) 10 - 30 ug/mL    Comment: (NOTE) Therapeutic concentrations vary significantly. A range of 10-30 ug/mL  may be an effective concentration for many patients. However, some  are best treated at concentrations outside of this range. Acetaminophen concentrations >150 ug/mL at 4 hours after ingestion  and >50 ug/mL at 12 hours after ingestion are often associated with  toxic reactions.  Performed at [facility], [address] [place], [place] [address]   cbc     Status: Abnormal   Collection  Time: 06/27/21  8:09 PM  Result Value Ref Range   WBC 6.8 4.0 - 10.5 K/uL   RBC 4.18 (L) 4.22 - 5.81 MIL/uL   Hemoglobin 11.8 (L) 13.0 - 17.0 g/dL   HCT 36.9 (L) 39.0 - 52.0 %   MCV 88.3 80.0 - 100.0 fL   MCH 28.2 26.0 - 34.0 pg   MCHC 32.0 30.0 - 36.0 g/dL   RDW 15.1 11.5 - 15.5 %   Platelets 456 (H) 150 - 400 K/uL   nRBC 0.0 0.0 - 0.2 %    Comment: Performed at [facility], [address], [place], [place] [address]  Urine Drug Screen, Qualitative     Status: Abnormal   Collection Time: 06/27/21  8:09 PM  Result Value Ref Range   Tricyclic, Ur Screen POSITIVE (A) NONE DETECTED   Amphetamines, Ur Screen NONE DETECTED NONE DETECTED   MDMA (Ecstasy)Ur Screen NONE DETECTED NONE DETECTED   Cocaine Metabolite,Ur [place] POSITIVE (A) NONE DETECTED   Opiate, Ur Screen NONE DETECTED NONE DETECTED   Phencyclidine (PCP) Ur S NONE DETECTED NONE DETECTED   Cannabinoid 50 Ng, Ur [place] NONE DETECTED NONE DETECTED   Barbiturates, Ur Screen NONE DETECTED NONE DETECTED   Benzodiazepine, Ur Scrn NONE DETECTED NONE DETECTED   Methadone Scn, Ur NONE DETECTED NONE DETECTED    Comment: (NOTE) Tricyclics + metabolites, urine    Cutoff 1000 ng/mL Amphetamines + metabolites, urine  Cutoff 1000 ng/mL MDMA (Ecstasy), urine              Cutoff 500 ng/mL Cocaine Metabolite, urine          Cutoff 300 ng/mL Opiate + metabolites, urine        Cutoff 300 ng/mL Phencyclidine (PCP), urine         Cutoff 25 ng/mL Cannabinoid, urine                 Cutoff 50 ng/mL Barbiturates + metabolites, urine  Cutoff 200 ng/mL Benzodiazepine, urine              Cutoff 200 ng/mL Methadone, urine                   Cutoff 300 ng/mL  The urine drug screen provides only a preliminary, unconfirmed analytical test result and should not be used for non-medical purposes. Clinical consideration and professional judgment should be applied to any positive drug screen result due to possible interfering substances. A more specific  alternate chemical method must be used in order to obtain a confirmed analytical result. Gas chromatography / mass spectrometry (GC/MS) is the preferred confirm atory method. Performed at [facility], [address] [place], [place] [address]   Resp Panel by RT-PCR (Flu A&B, Covid) Anterior Nasal Swab     Status: None   Collection Time: 06/27/21  8:09 PM   Specimen: Anterior  Nasal Swab  Result Value Ref Range   SARS Coronavirus 2 by RT PCR NEGATIVE NEGATIVE    Comment: (NOTE) SARS-CoV-2 target nucleic acids are NOT DETECTED.  The SARS-CoV-2 RNA is generally detectable in upper respiratory specimens during the acute phase of infection. The lowest concentration of SARS-CoV-2 viral copies this assay can detect is 138 copies/mL. A negative result does not preclude SARS-Cov-2 infection and should not be used as the sole basis for treatment or other patient management decisions. A negative result may occur with  improper specimen collection/handling, submission of specimen other than nasopharyngeal swab, presence of viral mutation(s) within the areas targeted by this assay, and inadequate number of viral copies(<138 copies/mL). A negative result must be combined with clinical observations, patient history, and epidemiological information. The expected result is Negative.  Fact Sheet for Patients:  BloggerCourse.com  Fact Sheet for Healthcare Providers:  SeriousBroker.it  This test is no t yet approved or cleared by the [place] FDA and  has been authorized for detection and/or diagnosis of SARS-CoV-2 by FDA under an Emergency Use Authorization (EUA). This EUA will remain  in effect (meaning this test can be used) for the duration of the COVID-19 declaration under Section 564(b)(1) of the Act, 21 U.S.C.section 360bbb-3(b)(1), unless the authorization is terminated  or revoked sooner.       Influenza A by PCR  NEGATIVE NEGATIVE   Influenza B by PCR NEGATIVE NEGATIVE    Comment: (NOTE) The Xpert Xpress SARS-CoV-2/FLU/RSV plus assay is intended as an aid in the diagnosis of influenza from Nasopharyngeal swab specimens and should not be used as a sole basis for treatment. Nasal washings and aspirates are unacceptable for Xpert Xpress SARS-CoV-2/FLU/RSV testing.  Fact Sheet for Patients: BloggerCourse.com  Fact Sheet for Healthcare Providers: SeriousBroker.it  This test is not yet approved or cleared by the [place] FDA and has been authorized for detection and/or diagnosis of SARS-CoV-2 by FDA under an Emergency Use Authorization (EUA). This EUA will remain in effect (meaning this test can be used) for the duration of the COVID-19 declaration under Section 564(b)(1) of the Act, 21 U.S.C. section 360bbb-3(b)(1), unless the authorization is terminated or revoked.  Performed at [facility], [address] [place], [place] [address]   Lithium level     Status: Abnormal   Collection Time: 06/27/21  8:20 PM  Result Value Ref Range   Lithium Lvl <0.06 (L) 0.60 - 1.20 mmol/L    Comment: Performed at [facility], [address] [place], [place] [address]    Blood Alcohol level:  Lab Results  Component Value Date   [facility] <10 06/27/2021   ETH <10 06/25/2021    Physical Findings: AIMS:  , ,  ,  ,    CIWA:    COWS:     Musculoskeletal: Strength & Muscle Tone: within normal limits Gait & Station: normal Patient leans: N/A  Psychiatric Specialty Exam:  Presentation  General Appearance: Casual; Neat  Eye Contact:Fair  Speech:Normal Rate  Speech Volume:Decreased  Handedness:Right   Mood and Affect  Mood:Anxious; Depressed; Hopeless  Affect:Appropriate; Constricted   Thought Process  Thought Processes:Coherent  Descriptions of Associations:Circumstantial  Orientation:Full (Time, Place and  Person)  Thought Content:Logical  History of Schizophrenia/Schizoaffective disorder:No  Duration of Psychotic Symptoms:N/A  Hallucinations:No data recorded Ideas of Reference:None  Suicidal Thoughts:Suicidal Thoughts: Yes, Passive SI Active Intent and/or Plan: With Intent  Homicidal Thoughts:Homicidal Thoughts: No   Sensorium  Memory:Immediate Fair; Remote Good  Judgment:Poor  Insight:Poor   Executive Functions  Concentration:Fair  Attention Span:Fair  [place]   Psychomotor Activity  Psychomotor Activity:No data recorded  Assets  Assets:Financial Resources/Insurance; Desire for Improvement; Resilience   Sleep  Sleep:Sleep: Fair    Physical Exam: Physical Exam Vitals and nursing note reviewed.  HENT:     Head: Normocephalic.     Nose: No congestion or rhinorrhea.  Eyes:     General:        Right eye: No discharge.        Left eye: No discharge.  Pulmonary:     Effort: Pulmonary effort is normal.  Musculoskeletal:     Cervical back: Normal range of motion.  Skin:    Comments: SELF-INFLICTED LACERATIONS TO LEFT FOREARM  Neurological:     Mental Status: He is oriented to person, place, and time.  Psychiatric:        Attention and Perception: Attention normal.        Mood and Affect: Mood is depressed. Affect is blunt.        Speech: Speech normal.        Behavior: Behavior is cooperative.        Thought Content: Thought content is not paranoid. Thought content includes suicidal (CHRONIC) ideation. Thought content does not include homicidal ideation.        Cognition and Memory: Cognition normal.        Judgment: Judgment is impulsive.    Review of Systems  Skin:        SELF-INFLICTED LACERATIONS, LEFT FOREARM  Psychiatric/Behavioral:  Positive for depression, substance abuse and suicidal ideas. Negative for hallucinations and memory loss. The patient is not nervous/anxious and does not have  insomnia.    Blood pressure 107/70, pulse 80, temperature 98.2 F (36.8 C), temperature source Oral, resp. rate 18, height 5\' 9"  (1.753 m), weight 77.1 kg, SpO2 97 %. Body mass index is 25.1 kg/m.  Treatment Plan Summary: Refer for inpatient psychiatric hospitalization  Sherlon Handing, NP 06/28/2021, 11:50 AM

## 2021-06-28 NOTE — ED Notes (Signed)
Sandwich and soft drink given.  

## 2021-06-28 NOTE — BH Assessment (Signed)
Patient has been accepted to [facility].  Patient assigned to [facility] Accepting physician is Dr.Greg Jola Babinski.  Call report to [phone].  Representative was Brie.   ER Staff is aware of it:  Bonita Quin, ER Secretary  Dr. Katharina Caper, ER MD  Selena Batten, Patient's Nurse     Patient can arrive anytime after 8:30 AM on 06/29/21.

## 2021-06-28 NOTE — ED Notes (Signed)
Pt given a cup of water per request. Pt ambulatory to the bathroom.  

## 2021-06-28 NOTE — BH Assessment (Signed)
Adult MH  Referral information for Psychiatric Hospitalization faxed to:   [facility] ([phone]-or- [phone]),   [facility] ([phone]),   [facility] ([phone] -or- [phone]),   [facility] (-[phone] -or- [phone]) [phone]   [facility] ([phone]---[phone]---[phone]),   [facility] ([phone] or [phone])   [facility] ([phone] or [phone]),   [facility] ([phone]). 

## 2021-06-29 NOTE — ED Provider Notes (Signed)
Emergency Medicine Observation Re-evaluation Note  [name] is a 61 y.o. male, seen on rounds today.   Physical Exam  BP 103/74 (BP Location: Right Arm)   Pulse 70   Temp 98.4 F (36.9 C) (Oral)   Resp 17   Ht 5\' 9"  (1.753 m)   Wt 77.1 kg   SpO2 99%   BMI 25.10 kg/m  Physical Exam General: Patient resting comfortably in bed Lungs: Patient not in respiratory distress Psych: Patient not combative  ED Course / MDM  EKG:     Plan  Current plan is for patient going to [facility] today. [name] is under involuntary commitment.      [name], MD 06/29/21 [phone]

## 2021-06-29 NOTE — ED Notes (Signed)
Report received from Kimberly S , RN including SBAR. On initial round after report Pt is warm/dry, resting quietly in room without any s/s of distress.  Will continue to monitor throughout shift as ordered for any changes in behaviors and for continued safety.   

## 2021-06-29 NOTE — ED Notes (Signed)
EMTALA reviewed by charge RN 

## 2021-06-29 NOTE — ED Notes (Signed)
Pt is A/Ox3 is up ad lib.  Endorses feelings of SI denies HI.  Stated he does not have A/V hallucinations however has "flashbacks".  All pt belongings accounted for by patient and given to [facility] for transport to inpatient facility.

## 2021-06-29 NOTE — ED Notes (Signed)
[place]  [facility]  called  for  transport  to  [facility] 

## 2021-06-29 NOTE — ED Notes (Signed)

## 2021-07-11 ENCOUNTER — Emergency Department: Payer: Medicaid Other

## 2021-07-11 ENCOUNTER — Inpatient Hospital Stay
Admission: EM | Admit: 2021-07-11 | Discharge: 2021-07-15 | DRG: 683 | Disposition: A | Discharge status: Home / Self Care | Payer: Medicaid Other | Attending: Internal Medicine | Admitting: Internal Medicine

## 2021-07-11 ENCOUNTER — Other Ambulatory Visit: Payer: Self-pay

## 2021-07-11 DIAGNOSIS — R45851 Suicidal ideations: Secondary | ICD-10-CM | POA: Diagnosis present

## 2021-07-11 DIAGNOSIS — Z87891 Personal history of nicotine dependence: Secondary | ICD-10-CM

## 2021-07-11 DIAGNOSIS — I959 Hypotension, unspecified: Secondary | ICD-10-CM | POA: Diagnosis present

## 2021-07-11 DIAGNOSIS — Z818 Family history of other mental and behavioral disorders: Secondary | ICD-10-CM

## 2021-07-11 DIAGNOSIS — Z96653 Presence of artificial knee joint, bilateral: Secondary | ICD-10-CM | POA: Diagnosis present

## 2021-07-11 DIAGNOSIS — F6089 Other specific personality disorders: Secondary | ICD-10-CM | POA: Diagnosis present

## 2021-07-11 DIAGNOSIS — F1010 Alcohol abuse, uncomplicated: Secondary | ICD-10-CM | POA: Diagnosis present

## 2021-07-11 DIAGNOSIS — F1020 Alcohol dependence, uncomplicated: Secondary | ICD-10-CM | POA: Diagnosis present

## 2021-07-11 DIAGNOSIS — Z9049 Acquired absence of other specified parts of digestive tract: Secondary | ICD-10-CM

## 2021-07-11 DIAGNOSIS — Z20822 Contact with and (suspected) exposure to covid-19: Secondary | ICD-10-CM | POA: Diagnosis present

## 2021-07-11 DIAGNOSIS — R112 Nausea with vomiting, unspecified: Secondary | ICD-10-CM | POA: Diagnosis not present

## 2021-07-11 DIAGNOSIS — Z79899 Other long term (current) drug therapy: Secondary | ICD-10-CM

## 2021-07-11 DIAGNOSIS — Z5901 Sheltered homelessness: Secondary | ICD-10-CM

## 2021-07-11 DIAGNOSIS — Z823 Family history of stroke: Secondary | ICD-10-CM

## 2021-07-11 DIAGNOSIS — F4310 Post-traumatic stress disorder, unspecified: Secondary | ICD-10-CM | POA: Diagnosis present

## 2021-07-11 DIAGNOSIS — N1831 Chronic kidney disease, stage 3a: Secondary | ICD-10-CM | POA: Diagnosis present

## 2021-07-11 DIAGNOSIS — N179 Acute kidney failure, unspecified: Principal | ICD-10-CM

## 2021-07-11 DIAGNOSIS — K529 Noninfective gastroenteritis and colitis, unspecified: Secondary | ICD-10-CM | POA: Diagnosis present

## 2021-07-11 DIAGNOSIS — F331 Major depressive disorder, recurrent, moderate: Secondary | ICD-10-CM | POA: Diagnosis present

## 2021-07-11 DIAGNOSIS — F609 Personality disorder, unspecified: Secondary | ICD-10-CM | POA: Diagnosis present

## 2021-07-11 DIAGNOSIS — F1490 Cocaine use, unspecified, uncomplicated: Secondary | ICD-10-CM | POA: Diagnosis present

## 2021-07-11 DIAGNOSIS — F319 Bipolar disorder, unspecified: Secondary | ICD-10-CM | POA: Diagnosis present

## 2021-07-11 DIAGNOSIS — D649 Anemia, unspecified: Secondary | ICD-10-CM | POA: Diagnosis present

## 2021-07-11 DIAGNOSIS — I129 Hypertensive chronic kidney disease with stage 1 through stage 4 chronic kidney disease, or unspecified chronic kidney disease: Secondary | ICD-10-CM | POA: Diagnosis present

## 2021-07-11 DIAGNOSIS — K219 Gastro-esophageal reflux disease without esophagitis: Secondary | ICD-10-CM | POA: Diagnosis present

## 2021-07-11 DIAGNOSIS — G2581 Restless legs syndrome: Secondary | ICD-10-CM | POA: Diagnosis present

## 2021-07-11 DIAGNOSIS — I10 Essential (primary) hypertension: Secondary | ICD-10-CM | POA: Diagnosis present

## 2021-07-11 LAB — CBC WITH DIFFERENTIAL/PLATELET
Abs Immature Granulocytes: 0.04 10*3/uL (ref 0.00–0.07)
Basophils Absolute: 0.1 10*3/uL (ref 0.0–0.1)
Basophils Relative: 1 %
Eosinophils Absolute: 0.2 10*3/uL (ref 0.0–0.5)
Eosinophils Relative: 2 %
HCT: 40.6 % (ref 39.0–52.0)
Hemoglobin: 13.4 g/dL (ref 13.0–17.0)
Immature Granulocytes: 0 %
Lymphocytes Relative: 23 %
Lymphs Abs: 2.4 10*3/uL (ref 0.7–4.0)
MCH: 29 pg (ref 26.0–34.0)
MCHC: 33 g/dL (ref 30.0–36.0)
MCV: 87.9 fL (ref 80.0–100.0)
Monocytes Absolute: 1.1 10*3/uL — ABNORMAL HIGH (ref 0.1–1.0)
Monocytes Relative: 11 %
Neutro Abs: 6.6 10*3/uL (ref 1.7–7.7)
Neutrophils Relative %: 63 %
Platelets: 313 10*3/uL (ref 150–400)
RBC: 4.62 MIL/uL (ref 4.22–5.81)
RDW: 15.9 % — ABNORMAL HIGH (ref 11.5–15.5)
WBC: 10.4 10*3/uL (ref 4.0–10.5)
nRBC: 0 % (ref 0.0–0.2)

## 2021-07-11 LAB — COMPREHENSIVE METABOLIC PANEL
ALT: 60 U/L — ABNORMAL HIGH (ref 0–44)
AST: 84 U/L — ABNORMAL HIGH (ref 15–41)
Albumin: 4.3 g/dL (ref 3.5–5.0)
Alkaline Phosphatase: 83 U/L (ref 38–126)
Anion gap: 15 (ref 5–15)
BUN: 34 mg/dL — ABNORMAL HIGH (ref 6–20)
CO2: 21 mmol/L — ABNORMAL LOW (ref 22–32)
Calcium: 10 mg/dL (ref 8.9–10.3)
Chloride: 101 mmol/L (ref 98–111)
Creatinine, Ser: 3.44 mg/dL — ABNORMAL HIGH (ref 0.61–1.24)
GFR, Estimated: 20 mL/min — ABNORMAL LOW (ref >60)
Glucose, Bld: 106 mg/dL — ABNORMAL HIGH (ref 70–99)
Potassium: 3.7 mmol/L (ref 3.5–5.1)
Sodium: 137 mmol/L (ref 135–145)
Total Bilirubin: 0.9 mg/dL (ref 0.3–1.2)
Total Protein: 8 g/dL (ref 6.5–8.1)

## 2021-07-11 LAB — LIPASE, BLOOD: Lipase: 33 U/L (ref 11–51)

## 2021-07-11 LAB — SARS CORONAVIRUS 2 BY RT PCR: SARS Coronavirus 2 by RT PCR: NEGATIVE

## 2021-07-11 LAB — ETHANOL: Alcohol, Ethyl (B): <10 mg/dL (ref <10)

## 2021-07-11 MED ORDER — SODIUM CHLORIDE 0.9 % IV SOLN: 12.5000 mg | Freq: Four times a day (QID) | INTRAVENOUS | Status: DC | PRN

## 2021-07-11 MED ORDER — GABAPENTIN 400 MG PO CAPS
400.0000 mg | ORAL_CAPSULE | Freq: Three times a day (TID) | ORAL | Status: DC
  Administered 2021-07-11 – 2021-07-15 (×12): 400 mg via ORAL
  Filled 2021-07-11 (×12): qty 1

## 2021-07-11 MED ORDER — HYDRALAZINE HCL 10 MG PO TABS: 10.0000 mg | ORAL_TABLET | Freq: Four times a day (QID) | ORAL | Status: AC | PRN

## 2021-07-11 MED ORDER — THIAMINE HCL 100 MG PO TABS
100.0000 mg | ORAL_TABLET | Freq: Every day | ORAL | Status: DC
  Administered 2021-07-11 – 2021-07-15 (×5): 100 mg via ORAL
  Filled 2021-07-11 (×5): qty 1

## 2021-07-11 MED ORDER — ACETAMINOPHEN 650 MG RE SUPP
650.0000 mg | Freq: Four times a day (QID) | RECTAL | Status: DC | PRN
Start: 2021-07-11 — End: 2021-07-15

## 2021-07-11 MED ORDER — OLANZAPINE 10 MG PO TABS: 10.0000 mg | ORAL_TABLET | Freq: Every day | ORAL | Status: DC

## 2021-07-11 MED ORDER — LACTATED RINGERS IV BOLUS
2000.0000 mL | Freq: Once | INTRAVENOUS | Status: AC
  Administered 2021-07-11: 2000 mL via INTRAVENOUS

## 2021-07-11 MED ORDER — SODIUM CHLORIDE 0.9 % IV BOLUS
1000.0000 mL | Freq: Once | INTRAVENOUS | Status: AC
  Administered 2021-07-11: 1000 mL via INTRAVENOUS

## 2021-07-11 MED ORDER — LACTATED RINGERS IV SOLN: INTRAVENOUS | Status: AC

## 2021-07-11 MED ORDER — HEPARIN SODIUM (PORCINE) 5000 UNIT/ML IJ SOLN
5000.0000 [IU] | Freq: Three times a day (TID) | INTRAMUSCULAR | Status: DC
  Administered 2021-07-11 – 2021-07-15 (×12): 5000 [IU] via SUBCUTANEOUS
  Filled 2021-07-11 (×12): qty 1

## 2021-07-11 MED ORDER — FOLIC ACID 1 MG PO TABS
1.0000 mg | ORAL_TABLET | Freq: Every day | ORAL | Status: DC
  Administered 2021-07-11 – 2021-07-15 (×5): 1 mg via ORAL
  Filled 2021-07-11 (×5): qty 1

## 2021-07-11 MED ORDER — LACTATED RINGERS IV SOLN: INTRAVENOUS | Status: DC

## 2021-07-11 MED ORDER — ONDANSETRON HCL 4 MG/2ML IJ SOLN: 4.0000 mg | Freq: Four times a day (QID) | INTRAMUSCULAR | Status: DC | PRN

## 2021-07-11 MED ORDER — ONDANSETRON HCL 4 MG PO TABS
4.0000 mg | ORAL_TABLET | Freq: Four times a day (QID) | ORAL | Status: DC | PRN
  Administered 2021-07-12: 4 mg via ORAL
  Filled 2021-07-11: qty 1

## 2021-07-11 MED ORDER — ACETAMINOPHEN 325 MG PO TABS
650.0000 mg | ORAL_TABLET | Freq: Four times a day (QID) | ORAL | Status: DC | PRN
  Administered 2021-07-11 – 2021-07-15 (×4): 650 mg via ORAL
  Filled 2021-07-11 (×4): qty 2

## 2021-07-11 MED ORDER — DOXEPIN HCL 100 MG PO CAPS
100.0000 mg | ORAL_CAPSULE | Freq: Every day | ORAL | Status: DC
  Administered 2021-07-11 – 2021-07-14 (×4): 100 mg via ORAL
  Filled 2021-07-11 (×4): qty 1

## 2021-07-11 MED ORDER — TRAZODONE HCL 50 MG PO TABS
25.0000 mg | ORAL_TABLET | Freq: Three times a day (TID) | ORAL | Status: DC | PRN
  Administered 2021-07-12 – 2021-07-14 (×3): 25 mg via ORAL
  Filled 2021-07-11 (×3): qty 1

## 2021-07-11 MED ORDER — MORPHINE SULFATE (PF) 4 MG/ML IV SOLN
4.0000 mg | Freq: Once | INTRAVENOUS | Status: DC
  Filled 2021-07-11: qty 1

## 2021-07-11 NOTE — Assessment & Plan Note (Signed)
-   Suspect prerenal secondary to GI loss - Baseline CKD 3A - Continue LR at 150 mL/h, to complete at 6 AM - BMP in a.m.

## 2021-07-11 NOTE — Assessment & Plan Note (Addendum)
With diarrhea Query gastroenteritis - Etiology work-up in progress at this time - Check C. difficile, GI panel, COVID PCR

## 2021-07-11 NOTE — Assessment & Plan Note (Addendum)
-   Patient was prescribed fluoxetine 20 mg daily, and he is no longer taking these medications - Fluoxetine 20 mg daily has not been resumed - Resumed home trazodone 50 mg tablet, 0.5 tablets 3 times daily as needed for anxiety

## 2021-07-11 NOTE — ED Provider Notes (Signed)
[facility] Provider Note   Event Date/Time   First MD Initiated Contact with Patient 07/11/21 1020     (approximate) History  Abdominal Pain  HPI [name] is a 61 y.o. male  Location: Bilateral upper abdominal quadrants Duration: 3 days prior to arrival Timing: Worsening since onset Severity: 10/10 Quality: Burning/cramping pain Context: Patient states that 3 days prior to arrival he began having bilateral upper quadrant pain that is now associated with nausea/vomiting/diarrhea Modifying factors: Any p.o. intake worsens the symptoms and he denies any relieving factors Associated Symptoms: Nausea/vomiting/diarrhea, anuria ROS: Patient currently denies any vision changes, tinnitus, difficulty speaking, facial droop, sore throat, chest pain, shortness of breath, dysuria, or weakness/numbness/paresthesias in any extremity   Physical Exam  Triage Vital Signs: ED Triage Vitals  Enc Vitals Group     BP 07/11/21 1030 96/69     Pulse Rate 07/11/21 1030 (!) 108     Resp 07/11/21 1030 18     Temp 07/11/21 1030 98.3 F (36.8 C)     Temp Source 07/11/21 1030 Oral     SpO2 07/11/21 1030 98 %     Weight 07/11/21 1024 170 lb (77.1 kg)     Height 07/11/21 1024 5\' 9"  (1.753 m)     Head Circumference --      Peak Flow --      Pain Score 07/11/21 1023 10     Pain Loc --      Pain Edu? --      Excl. in [place]? --    Most recent vital signs: Vitals:   07/11/21 1215 07/11/21 1230  BP: 98/68 97/69  Pulse: 88 88  Resp: 14 15  Temp:    SpO2: 97% 100%   General: Awake, oriented x4. CV:  Good peripheral perfusion.  Resp:  Normal effort.  Abd:  No distention.  Other:  Middle-aged overweight Caucasian male laying in bed in mild distress secondary to pain ED Results / Procedures / Treatments  Labs (all labs ordered are listed, but only abnormal results are displayed) Labs Reviewed  COMPREHENSIVE METABOLIC PANEL - Abnormal; Notable for the following  components:      Result Value   CO2 21 (*)    Glucose, Bld 106 (*)    BUN 34 (*)    Creatinine, Ser 3.44 (*)    AST 84 (*)    ALT 60 (*)    GFR, Estimated 20 (*)    All other components within normal limits  CBC WITH DIFFERENTIAL/PLATELET - Abnormal; Notable for the following components:   RDW 15.9 (*)    Monocytes Absolute 1.1 (*)    All other components within normal limits  GASTROINTESTINAL PANEL BY PCR, STOOL (REPLACES STOOL CULTURE)  LIPASE, BLOOD  URINALYSIS, ROUTINE W REFLEX MICROSCOPIC   EKG ED ECG REPORT I, [name], the attending physician, personally viewed and interpreted this ECG. Date: 07/11/2021 EKG Time: 1028 Rate: 106 Rhythm: Tachycardic sinus rhythm QRS Axis: normal Intervals: normal ST/T Wave abnormalities: normal Narrative Interpretation: Tachycardic sinus rhythm.  No evidence of acute ischemia RADIOLOGY ED MD interpretation: CT without contrast of the abdomen and pelvis does not show any evidence of acute intra-abdominal or intrapelvic abnormalities -Agree with radiology assessment Official radiology report(s): CT ABDOMEN PELVIS WO CONTRAST  Result Date: 07/11/2021 CLINICAL DATA:  Nausea, vomiting, RIGHT lower quadrant pain, severe abdominal pain 1 and diaphoresis, recent history of appendicitis with surgery EXAM: CT ABDOMEN AND PELVIS WITHOUT CONTRAST TECHNIQUE: Multidetector  CT imaging of the abdomen and pelvis was performed following the standard protocol without IV contrast. RADIATION DOSE REDUCTION: This exam was performed according to the departmental dose-optimization program which includes automated exposure control, adjustment of the mA and/or kV according to patient size and/or use of iterative reconstruction technique. COMPARISON:  06/13/2021 FINDINGS: Lower chest: Minimal dependent density at the posterior lower lobes. Lung bases otherwise clear. Hepatobiliary: Gallbladder and liver normal appearance Pancreas: Normal appearance Spleen: Normal  appearance Adrenals/Urinary Tract: Tiny BILATERAL nonobstructing renal calculi. Adrenal glands, kidneys, ureters, and bladder otherwise normal appearance. Stomach/Bowel: Appendix surgically absent. Stomach and bowel loops normal appearance Vascular/Lymphatic: Atherosclerotic calcifications aorta and iliac arteries without aneurysm. No adenopathy. Reproductive: Unremarkable prostate gland and seminal vesicles Other: No free air or free fluid. No hernia or inflammatory process. Minimal postsurgical changes of the soft tissues at the umbilicus. Musculoskeletal: Osseous structures unremarkable. IMPRESSION: Post appendectomy. No acute intra-abdominal or intrapelvic abnormalities. Aortic Atherosclerosis (ICD10-I70.0). Electronically Signed   By: Ulyses Southward M.D.   On: 07/11/2021 12:10   PROCEDURES: Critical Care performed: Yes, see critical care procedure note(s) .1-3 Lead EKG Interpretation  Performed by: Merwyn Katos, MD Authorized by: Merwyn Katos, MD     Interpretation: normal     ECG rate:  89   ECG rate assessment: normal     Rhythm: sinus rhythm     Ectopy: none     Conduction: normal   CRITICAL CARE Performed by: Merwyn Katos  Total critical care time: 33 minutes  Critical care time was exclusive of separately billable procedures and treating other patients.  Critical care was necessary to treat or prevent imminent or life-threatening deterioration.  Critical care was time spent personally by me on the following activities: development of treatment plan with patient and/or surrogate as well as nursing, discussions with consultants, evaluation of patient's response to treatment, examination of patient, obtaining history from patient or surrogate, ordering and performing treatments and interventions, ordering and review of laboratory studies, ordering and review of radiographic studies, pulse oximetry and re-evaluation of patient's condition.  MEDICATIONS ORDERED IN ED: Medications   morphine (PF) 4 MG/ML injection 4 mg (0 mg Intravenous Hold 07/11/21 1045)  lactated ringers infusion (has no administration in time range)  sodium chloride 0.9 % bolus 1,000 mL (0 mLs Intravenous Stopped 07/11/21 1121)  lactated ringers bolus 2,000 mL (2,000 mLs Intravenous New Bag/Given 07/11/21 1121)   IMPRESSION / MDM / ASSESSMENT AND PLAN / ED COURSE  I reviewed the triage vital signs and the nursing notes.                             The patient is on the cardiac monitor to evaluate for evidence of arrhythmia and/or significant heart rate changes. Patient's presentation is most consistent with acute presentation with potential threat to life or bodily function. This patient presents with generalized weakness and fatigue likely secondary to dehydration. Suspect acute kidney injury of prerenal origin. Doubt intrinsic renal dysfunction or obstructive nephropathy. Considered alternate etiologies of the patients symptoms including infectious processes, severe metabolic derangements or electrolyte abnormalities, ischemia/ACS, heart failure, and intracranial/central processes but think these are unlikely given the history and physical exam.  Plan: labs, 3L fluid resuscitation, pain/nausea control, reassessment Patient's creatinine significantly elevated from his baseline and will require admission for further evaluation and management Dispo: Admit to medicine   FINAL CLINICAL IMPRESSION(S) / ED DIAGNOSES   Final diagnoses:  None   Rx / DC Orders   ED Discharge Orders     None      Note:  This document was prepared using Dragon voice recognition software and may include unintentional dictation errors.   Merwyn Katos, MD 07/11/21 [phone]

## 2021-07-11 NOTE — ED Notes (Signed)
ED TO INPATIENT HANDOFF REPORT  ED Nurse Name and Phone #: Delice Bison, RN  S Name/Age/Gender [name] [name] 61 y.o. male Room/Bed: ED06A/ED06A  Code Status   Code Status: Full Code  Home/SNF/Other Home Patient oriented to: self, place, time, and situation Is this baseline? Yes   Triage Complete: Triage complete  Chief Complaint Intractable nausea and vomiting [R11.2]  Triage Note [facility] reports pt was walking down the street and began having severe abdominal pain on the right side, pt diaphoretic upon EMS arrival.   Allergies No Known Allergies  Level of Care/Admitting Diagnosis ED Disposition     ED Disposition  Admit   Condition  --   Comment  Hospital Area: [facility] [[id number]]  Level of Care: Telemetry Medical [104]  Covid Evaluation: Symptomatic Person Under Investigation (PUI) or recent exposure (last 10 days) *Testing Required*  Diagnosis: Intractable nausea and vomiting [720114]  Admitting Physician: Lovenia Kim [[id number]]  Attending Physician: [facility], AMY N [id number]          B Medical/Surgery History Past Medical History:  Diagnosis Date   Anxiety    Arthritis    knees and hands   Bipolar disorder (HCC)    Depression    GERD (gastroesophageal reflux disease)    Hepatitis    HEP "C"   History of kidney stones    Hypertension    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 echo 5/17: EF 60-65%, no RWMA, LV dias fxn nl, LA mildly dilated, RV sys fxn nl, PASP nl, moderate sized circumferential pericardial effusion was identified, 2.12 cm around the LV free wall, <1 cm around the RV free wall. Features were not c/w tamponade physiology   PTSD (post-traumatic stress disorder)    Witnessed brother's suicide.   Restless leg syndrome    Seizures (HCC)    Syncope    Past Surgical History:  Procedure Laterality Date   CYSTOSCOPY WITH URETEROSCOPY AND STENT PLACEMENT      ESOPHAGOGASTRODUODENOSCOPY N/A 01/11/2016   Procedure: ESOPHAGOGASTRODUODENOSCOPY (EGD);  Surgeon: Charlott Rakes, MD;  Location: [facility] ENDOSCOPY;  Service: Endoscopy;  Laterality: N/A;   ESOPHAGOGASTRODUODENOSCOPY N/A 04/09/2020   Procedure: ESOPHAGOGASTRODUODENOSCOPY (EGD);  Surgeon: [name], MD;  Location: [facility] ENDOSCOPY;  Service: Gastroenterology;  Laterality: N/A;   INCISION AND DRAINAGE ABSCESS Left 01/02/2018   Procedure: INCISION AND DRAINAGE LEFT KNEE;  Surgeon: Deeann Saint, MD;  Location: [facility] ORS;  Service: Orthopedics;  Laterality: Left;   JOINT REPLACEMENT Right    TKR   KNEE ARTHROSCOPY Right 06/25/2014   Procedure: ARTHROSCOPY KNEE;  Surgeon: Deeann Saint, MD;  Location: [facility] ORS;  Service: Orthopedics;  Laterality: Right;  partial arthroscopic medial menisectomy   LAPAROSCOPIC APPENDECTOMY N/A 06/02/2021   Procedure: APPENDECTOMY LAPAROSCOPIC;  Surgeon: Campbell Lerner, MD;  Location: [facility] ORS;  Service: General;  Laterality: N/A;   TOTAL KNEE ARTHROPLASTY Right 04/22/2015   Procedure: TOTAL KNEE ARTHROPLASTY;  Surgeon: Deeann Saint, MD;  Location: [facility] ORS;  Service: Orthopedics;  Laterality: Right;   TOTAL KNEE ARTHROPLASTY Left 10/30/2017   Procedure: TOTAL KNEE ARTHROPLASTY;  Surgeon: Deeann Saint, MD;  Location: [facility] ORS;  Service: Orthopedics;  Laterality: Left;   TOTAL KNEE REVISION Left 01/02/2018   Procedure: poly exchange of tibia and patella left knee;  Surgeon: Deeann Saint, MD;  Location: [facility] ORS;  Service: Orthopedics;  Laterality: Left;   UMBILICAL HERNIA REPAIR  06/02/2021   Procedure: HERNIA REPAIR  UMBILICAL ADULT;  Surgeon: Ronny Bacon, MD;  Location: [facility] ORS;  Service: General;;     A IV Location/Drains/Wounds Patient Lines/Drains/Airways Status     Active Line/Drains/Airways     Name Placement date Placement time Site Days   Peripheral IV 07/11/21 20 G 1" Anterior;Left Forearm 07/11/21  1027  Forearm  less than 1   Incision (Closed)  01/02/18 Knee Left 01/02/18  0943  -- 1286            Intake/Output Last 24 hours No intake or output data in the 24 hours ending 07/11/21 1502  Labs/Imaging Results for orders placed or performed during the hospital encounter of 07/11/21 (from the past 48 hour(s))  Comprehensive metabolic panel     Status: Abnormal   Collection Time: 07/11/21 10:34 AM  Result Value Ref Range   Sodium 137 135 - 145 mmol/L   Potassium 3.7 3.5 - 5.1 mmol/L   Chloride 101 98 - 111 mmol/L   CO2 21 (L) 22 - 32 mmol/L   Glucose, Bld 106 (H) 70 - 99 mg/dL    Comment: Glucose reference range applies only to samples taken after fasting for at least 8 hours.   BUN 34 (H) 6 - 20 mg/dL   Creatinine, Ser 3.44 (H) 0.61 - 1.24 mg/dL   Calcium 10.0 8.9 - 10.3 mg/dL   Total Protein 8.0 6.5 - 8.1 g/dL   Albumin 4.3 3.5 - 5.0 g/dL   AST 84 (H) 15 - 41 U/L   ALT 60 (H) 0 - 44 U/L   Alkaline Phosphatase 83 38 - 126 U/L   Total Bilirubin 0.9 0.3 - 1.2 mg/dL   GFR, Estimated 20 (L) >60 mL/min    Comment: (NOTE) Calculated using the CKD-EPI Creatinine Equation (2021)    Anion gap 15 5 - 15    Comment: Performed at [facility], [address] [place], [place] [address]  Lipase, blood     Status: None   Collection Time: 07/11/21 10:34 AM  Result Value Ref Range   Lipase 33 11 - 51 U/L    Comment: Performed at [facility], [address] [place], [place] [address]  CBC with Differential     Status: Abnormal   Collection Time: 07/11/21 10:34 AM  Result Value Ref Range   WBC 10.4 4.0 - 10.5 K/uL   RBC 4.62 4.22 - 5.81 MIL/uL   Hemoglobin 13.4 13.0 - 17.0 g/dL   HCT 40.6 39.0 - 52.0 %   MCV 87.9 80.0 - 100.0 fL   MCH 29.0 26.0 - 34.0 pg   MCHC 33.0 30.0 - 36.0 g/dL   RDW 15.9 (H) 11.5 - 15.5 %   Platelets 313 150 - 400 K/uL   nRBC 0.0 0.0 - 0.2 %   Neutrophils Relative % 63 %   Neutro Abs 6.6 1.7 - 7.7 K/uL   Lymphocytes Relative 23 %   Lymphs Abs 2.4 0.7 - 4.0 K/uL   Monocytes  Relative 11 %   Monocytes Absolute 1.1 (H) 0.1 - 1.0 K/uL   Eosinophils Relative 2 %   Eosinophils Absolute 0.2 0.0 - 0.5 K/uL   Basophils Relative 1 %   Basophils Absolute 0.1 0.0 - 0.1 K/uL   Immature Granulocytes 0 %   Abs Immature Granulocytes 0.04 0.00 - 0.07 K/uL    Comment: Performed at [facility], [address], [place], [place] [address]  Ethanol     Status: None   Collection Time: 07/11/21  1:30 PM  Result Value Ref Range   Alcohol, Ethyl (B) <10 <10 mg/dL    Comment: (NOTE) Lowest detectable limit for serum alcohol is 10 mg/dL.  For medical purposes only. Performed at [facility], [address] [place], [place] [address]    CT ABDOMEN PELVIS WO CONTRAST  Result Date: 07/11/2021 CLINICAL DATA:  Nausea, vomiting, RIGHT lower quadrant pain, severe abdominal pain 1 and diaphoresis, recent history of appendicitis with surgery EXAM: CT ABDOMEN AND PELVIS WITHOUT CONTRAST TECHNIQUE: Multidetector CT imaging of the abdomen and pelvis was performed following the standard protocol without IV contrast. RADIATION DOSE REDUCTION: This exam was performed according to the departmental dose-optimization program which includes automated exposure control, adjustment of the mA and/or kV according to patient size and/or use of iterative reconstruction technique. COMPARISON:  06/13/2021 FINDINGS: Lower chest: Minimal dependent density at the posterior lower lobes. Lung bases otherwise clear. Hepatobiliary: Gallbladder and liver normal appearance Pancreas: Normal appearance Spleen: Normal appearance Adrenals/Urinary Tract: Tiny BILATERAL nonobstructing renal calculi. Adrenal glands, kidneys, ureters, and bladder otherwise normal appearance. Stomach/Bowel: Appendix surgically absent. Stomach and bowel loops normal appearance Vascular/Lymphatic: Atherosclerotic calcifications aorta and iliac arteries without aneurysm. No adenopathy. Reproductive: Unremarkable prostate gland and  seminal vesicles Other: No free air or free fluid. No hernia or inflammatory process. Minimal postsurgical changes of the soft tissues at the umbilicus. Musculoskeletal: Osseous structures unremarkable. IMPRESSION: Post appendectomy. No acute intra-abdominal or intrapelvic abnormalities. Aortic Atherosclerosis (ICD10-I70.0). Electronically Signed   By: Lavonia Dana M.D.   On: 07/11/2021 12:10    Pending Labs Unresulted Labs (From admission, onward)     Start     Ordered   07/12/21 [id number]  Basic metabolic panel  Tomorrow morning,   STAT        07/11/21 1242   07/12/21 0500  CBC  Tomorrow morning,   STAT        07/11/21 1242   07/11/21 1500  C Difficile Quick Screen w PCR reflex  Once,   R       References:    CDiff Information Tool   07/11/21 1500   07/11/21 1258  SARS Coronavirus 2 by RT PCR (hospital order, performed in [facility] hospital lab) *cepheid single result test* Anterior Nasal Swab  (Tier 2 - Symptomatic/Asymptomatic)  Once,   R        07/11/21 1257   07/11/21 1255  Urine Drug Screen, Qualitative ([place] only)  Once,   R        07/11/21 1254   07/11/21 1255  Urinalysis, Complete w Microscopic  Once,   R        07/11/21 1254   07/11/21 1231  Gastrointestinal Panel by PCR , Stool  (Gastrointestinal Panel by PCR, Stool                                                                                                                                                     **  Does Not include CLOSTRIDIUM DIFFICILE testing. **If CDIFF testing is needed, place order from the "C Difficile Testing" order set.**)  Once,   URGENT        07/11/21 1230   07/11/21 1034  Urinalysis, Routine w reflex microscopic  ONCE - URGENT,   URGENT        07/11/21 1033            Vitals/Pain Today's Vitals   07/11/21 1245 07/11/21 1300 07/11/21 1315 07/11/21 1330  BP: 93/63 97/63 107/73 91/69  Pulse: 87 90 90 87  Resp: 10 16 14 14   Temp:      TempSrc:      SpO2: 99% 93% 95% 99%  Weight:      Height:       PainSc:        Isolation Precautions Airborne and Contact precautions  Medications Medications  morphine (PF) 4 MG/ML injection 4 mg (0 mg Intravenous Hold 07/11/21 1045)  lactated ringers infusion ( Intravenous New Bag/Given 07/11/21 1300)  acetaminophen (TYLENOL) tablet 650 mg (has no administration in time range)    Or  acetaminophen (TYLENOL) suppository 650 mg (has no administration in time range)  ondansetron (ZOFRAN) tablet 4 mg (has no administration in time range)    Or  ondansetron (ZOFRAN) injection 4 mg (has no administration in time range)  heparin injection 5,000 Units (5,000 Units Subcutaneous Given 07/11/21 1444)  hydrALAZINE (APRESOLINE) tablet 10 mg (has no administration in time range)  promethazine (PHENERGAN) 12.5 mg in sodium chloride 0.9 % 50 mL IVPB (has no administration in time range)  doxepin (SINEQUAN) capsule 100 mg (has no administration in time range)  traZODone (DESYREL) tablet 25 mg (has no administration in time range)  folic acid (FOLVITE) tablet 1 mg (1 mg Oral Given 07/11/21 1501)  gabapentin (NEURONTIN) capsule 400 mg (has no administration in time range)  sodium chloride 0.9 % bolus 1,000 mL (0 mLs Intravenous Stopped 07/11/21 1121)  lactated ringers bolus 2,000 mL (0 mLs Intravenous Stopped 07/11/21 1300)    Mobility walks Low fall risk   Focused Assessments Cardiac Assessment Handoff:    Lab Results  Component Value Date   CKTOTAL 606 (H) 06/14/2021   TROPONINI <0.03 01/26/2016   No results found for: "DDIMER" Does the Patient currently have chest pain? No    R Recommendations: See Admitting Provider Note  Report given to:   Additional Notes:

## 2021-07-11 NOTE — H&P (Addendum)
History and Physical   [name] LEX:[id number] DOB: [birth year] DOA: 07/11/2021  PCP: [facility]  Outpatient Specialists: Dr. Claudine Mouton, general surgery Patient coming from: Home/on the road via EMS  I have personally briefly reviewed patient's old medical records in [facility] EMR.  Chief Concern: Nausea, vomiting, diarrhea  HPI: [name] is a 61 year old male with history of hypertension, neuropathy, history of appendectomy in May 2023, insomnia, cocaine use, who presents to the emergency department for chief concerns of nausea vomiting diarrhea.  Initial vitals in the emergency department showed temperature of 98.3, respiration rate of 16, heart rate 96, blood pressure 84/55, SPO2 of 92% on room air.  Serum sodium 137, potassium 2.7, chloride 101, bicarb 21, BUN of 34, serum creatinine of 3.44, nonfasting blood glucose 106, GFR of 20, WBC 10.4, hemoglobin 13.3, platelets of 313.  AST was 84, ALT was 60.  ED treatment: Morphine 4 mg IV one-time dose was ordered however was held due to hypotension.  LR 2 L bolus was ordered and given.  Sodium chloride 1 L bolus was ordered and given.  LR 150 mL/h was ordered.  At bedside he is alert oriented to self, age, current year, current location.  He reports that he started having nausea, vomiting, loose and watery bowel movements about 2 days ago. He denies change in diet.  He denies other people in his household being sick with the same.  He denies known sick contacts.  He denies recent antibiotic use. He denies fever, chest pain. He endorses shortness of breath. He deies recent trauma.   Social history: He lives with friends. He denies tobacco, etoh. He snorted cocaine last week.  ROS: Constitutional: no weight change, no fever ENT/Mouth: no sore throat, no rhinorrhea Eyes: no eye pain, no vision changes Cardiovascular: no chest pain, no dyspnea,  no edema, no palpitations Respiratory: no cough, no  sputum, no wheezing Gastrointestinal: + nausea, + vomiting, + diarrhea, no constipation Genitourinary: no urinary incontinence, no dysuria, no hematuria Musculoskeletal: no arthralgias, no myalgias Skin: no skin lesions, no pruritus, Neuro: no weakness, no loss of consciousness, no syncope Psych: no anxiety, no depression, + decrease appetite Heme/Lymph: no bruising, no bleeding  ED Course: Discussed with emergency medicine provider, patient requiring hospitalization for chief concerns of intractable nausea and vomiting with diarrhea.  Assessment/Plan  Principal Problem:   Intractable nausea and vomiting Active Problems:   Major depressive disorder, recurrent episode, moderate (HCC)   Essential hypertension   Bipolar disorder (HCC)   Alcohol abuse   Cluster B personality disorder (HCC)   AKI (acute kidney injury) (HCC)   Cocaine use   Assessment and Plan:  * Intractable nausea and vomiting With diarrhea Query gastroenteritis - Etiology work-up in progress at this time - Check C. difficile, GI panel, COVID PCR  Major depressive disorder, recurrent episode, moderate (HCC) - Patient was prescribed fluoxetine 20 mg daily, and he is no longer taking these medications - Fluoxetine 20 mg daily has not been resumed - Resumed home trazodone 50 mg tablet, 0.5 tablets 3 times daily as needed for anxiety  Essential hypertension - Holding home enalapril 10 mg daily, metoprolol succinate 50 mg p.o. twice daily due to hypotension on presentation requiring fluid bolus and enalapril due to AKI - Hydralazine 10 mg every 6 hours as needed for SBP greater than 175, 4 days ordered - AM team to resume home antihypertensive medications when appropriate  Bipolar disorder (HCC) - Patient is no longer  taking lithium 300 mg p.o. twice daily  AKI (acute kidney injury) (HCC) - Suspect prerenal secondary to GI loss - Baseline CKD 3A - Continue LR at 150 mL/h, to complete at 6 AM - BMP in  a.m.  Chart reviewed.   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 echo 5/17: EF 60-65%, no RWMA, LV dias fxn nl, LA mildly dilated, RV sys fxn nl, PASP nl, moderate sized circumferential pericardial effusion was identified, 2.12 cm around the LV free wall, <1 cm around the RV free wall. Features were not c/w tamponade physiology   PTSD (post-traumatic stress disorder)    Witnessed brother's suicide.   Restless leg syndrome    Seizures (HCC)    Syncope    Past Surgical History:  Procedure Laterality Date   CYSTOSCOPY WITH URETEROSCOPY AND STENT PLACEMENT     ESOPHAGOGASTRODUODENOSCOPY N/A 01/11/2016   Procedure: ESOPHAGOGASTRODUODENOSCOPY (EGD);  Surgeon: Charlott Rakes, MD;  Location: [facility] ENDOSCOPY;  Service: Endoscopy;  Laterality: N/A;   ESOPHAGOGASTRODUODENOSCOPY N/A 04/09/2020   Procedure: ESOPHAGOGASTRODUODENOSCOPY (EGD);  Surgeon: [name], MD;  Location: [facility] ENDOSCOPY;  Service: Gastroenterology;  Laterality: N/A;   INCISION AND DRAINAGE ABSCESS Left 01/02/2018   Procedure: INCISION AND DRAINAGE LEFT KNEE;  Surgeon: Deeann Saint, MD;  Location: [facility] ORS;  Service: Orthopedics;  Laterality: Left;   JOINT REPLACEMENT Right    TKR   KNEE ARTHROSCOPY Right 06/25/2014   Procedure: ARTHROSCOPY KNEE;  Surgeon: Deeann Saint, MD;  Location: [facility] ORS;  Service: Orthopedics;  Laterality: Right;  partial arthroscopic medial  menisectomy   LAPAROSCOPIC APPENDECTOMY N/A 06/02/2021   Procedure: APPENDECTOMY LAPAROSCOPIC;  Surgeon: Campbell Lerner, MD;  Location: [facility] ORS;  Service: General;  Laterality: N/A;   TOTAL KNEE ARTHROPLASTY Right 04/22/2015   Procedure: TOTAL KNEE ARTHROPLASTY;  Surgeon: Deeann Saint, MD;  Location: [facility] ORS;  Service: Orthopedics;  Laterality: Right;   TOTAL KNEE ARTHROPLASTY Left 10/30/2017   Procedure: TOTAL KNEE ARTHROPLASTY;  Surgeon: Deeann Saint, MD;  Location: [facility] ORS;  Service: Orthopedics;  Laterality: Left;   TOTAL KNEE REVISION Left 01/02/2018   Procedure: poly exchange of tibia and patella left knee;  Surgeon: Deeann Saint, MD;  Location: [facility] ORS;  Service: Orthopedics;  Laterality: Left;   UMBILICAL HERNIA REPAIR  06/02/2021   Procedure: HERNIA REPAIR UMBILICAL ADULT;  Surgeon: Campbell Lerner, MD;  Location: [facility] ORS;  Service: General;;   Social History:  reports that he quit smoking about 37 years ago. His smoking use included cigarettes. He has a 15.00 pack-year smoking history. He has never used smokeless tobacco. He reports current alcohol use. He reports current drug use. Drugs: Marijuana and Cocaine.  No Known Allergies Family History  Problem Relation Age of Onset   CVA Mother        deceased at age 35   Depression Brother        Died by suicide  at age 90   Family history: Family history reviewed and not pertinent.  Prior to Admission medications   Medication Sig Start Date End Date Taking? Authorizing Provider  doxepin (SINEQUAN) 100 MG capsule Take 1 capsule (100 mg total) by mouth at bedtime. 05/12/21   Sarina Ill, DO  enalapril (VASOTEC) 10 MG tablet Hold until followup with PCP due to your acute kidney injury. Patient not taking: Reported on 06/25/2021 06/18/21   Darlin Priestly, MD  FLUoxetine (PROZAC) 20 MG capsule Take 3 capsules (60 mg total) by mouth daily. 06/18/21 09/16/21  Darlin Priestly, MD  folic acid (FOLVITE) 1 MG tablet Take 1 tablet (1 mg  total) by mouth daily. 06/18/21   Darlin Priestly, MD  gabapentin (NEURONTIN) 400 MG capsule Take 1 capsule (400 mg total) by mouth 3 (three) times daily. 05/12/21   Sarina Ill, DO  lithium carbonate (LITHOBID) 300 MG CR tablet Take 300 mg by mouth 2 (two) times daily. 06/16/21   [provider]  metoprolol succinate (TOPROL-XL) 50 MG 24 hr tablet Take 1 tablet (50 mg total) by mouth daily. Take with or immediately following a meal. 06/18/21 09/16/21  Darlin Priestly, MD  Multiple Vitamin (MULTIVITAMIN WITH MINERALS) TABS tablet Take 1 tablet by mouth daily. 06/18/21   Darlin Priestly, MD  naltrexone (DEPADE) 50 MG tablet Take 50 mg by mouth daily.    [provider]  naproxen (NAPROSYN) 500 MG tablet Take 1 tablet (500 mg total) by mouth 2 (two) times daily as needed. Home med. 06/18/21 07/18/21  Darlin Priestly, MD  OLANZapine (ZYPREXA) 10 MG tablet Take 1 tablet (10 mg total) by mouth at bedtime. 06/18/21 09/16/21  Darlin Priestly, MD  OLANZapine (ZYPREXA) 5 MG tablet Take 1 tablet (5 mg total) by mouth daily. 06/18/21 09/16/21  Darlin Priestly, MD  thiamine 100 MG tablet Take 1 tablet (100 mg total) by mouth daily. 06/18/21   Darlin Priestly, MD  traZODone (DESYREL) 50 MG tablet Take 0.5 tablets (25 mg total) by mouth 3 (three) times daily as needed (Anxiety). 05/12/21   Sarina Ill, DO   Physical Exam: Vitals:   07/11/21 1430 07/11/21 1500 07/11/21 1515 07/11/21 1612  BP: (!) 94/59 (!) 89/67 108/70 94/64  Pulse: 89 88 90 86  Resp: 10 (!) 21 12 16   Temp:    97.6 F (36.4 C)  TempSrc:      SpO2: 96% 97% 94% 99%  Weight:      Height:       Constitutional: appears age-appropriate, NAD, calm, comfortable Eyes: PERRL, lids and conjunctivae normal ENMT: Mucous membranes are moist. Posterior pharynx clear of any exudate or lesions. Age-appropriate dentition. Hearing appropriate Neck: normal, supple, no masses, no thyromegaly Respiratory: clear to auscultation bilaterally, no wheezing, no crackles. Normal respiratory  effort. No accessory muscle use.  Cardiovascular: Regular rate and rhythm, no murmurs / rubs / gallops. No extremity edema. 2+ pedal pulses. No carotid bruits.  Abdomen: no tenderness, no masses palpated, no hepatosplenomegaly. Bowel sounds positive.  Musculoskeletal: no clubbing / cyanosis. No joint deformity upper and lower extremities. Good ROM, no contractures, no atrophy. Normal muscle tone.  Skin: no rashes, lesions, ulcers. No induration Neurologic: Sensation intact. Strength 5/5 in all 4.  Psychiatric: Normal judgment and insight. Alert and oriented x 3. Normal mood.   EKG: independently reviewed, showing sinus tachycardia with rate of 106, QTc 477  Chest x-ray on Admission: I personally reviewed and I agree with radiologist reading as below.  CT ABDOMEN PELVIS WO CONTRAST  Result Date: 07/11/2021 CLINICAL DATA:  Nausea, vomiting, RIGHT lower quadrant pain, severe abdominal pain 1 and diaphoresis, recent history of appendicitis with surgery EXAM: CT ABDOMEN AND PELVIS WITHOUT CONTRAST TECHNIQUE: Multidetector CT imaging of the abdomen and pelvis was performed following the standard protocol without IV contrast. RADIATION DOSE REDUCTION: This exam was performed according to the departmental dose-optimization program which includes automated exposure control, adjustment of the mA and/or kV according to patient size and/or use of iterative reconstruction technique. COMPARISON:  06/13/2021 FINDINGS: Lower chest: Minimal dependent density at the posterior lower lobes. Lung bases otherwise clear. Hepatobiliary: Gallbladder and liver normal appearance Pancreas: Normal appearance Spleen: Normal appearance Adrenals/Urinary Tract: Tiny BILATERAL nonobstructing renal calculi. Adrenal glands, kidneys, ureters, and bladder otherwise normal appearance. Stomach/Bowel: Appendix surgically absent. Stomach and bowel loops normal appearance Vascular/Lymphatic: Atherosclerotic calcifications aorta and iliac  arteries without aneurysm. No adenopathy. Reproductive: Unremarkable prostate gland and seminal vesicles Other: No free air or free fluid. No hernia or inflammatory process. Minimal postsurgical changes of the soft tissues at the umbilicus. Musculoskeletal: Osseous structures unremarkable. IMPRESSION: Post appendectomy. No acute intra-abdominal or intrapelvic abnormalities. Aortic Atherosclerosis (ICD10-I70.0). Electronically Signed   By: Ulyses Southward M.D.   On: 07/11/2021 12:10    Labs on Admission: I have personally reviewed following labs  CBC: Recent Labs  Lab 07/11/21 1034  WBC 10.4  NEUTROABS 6.6  HGB 13.4  HCT 40.6  MCV 87.9  PLT 313   Basic Metabolic Panel: Recent Labs  Lab 07/11/21 1034  NA 137  K 3.7  CL 101  CO2 21*  GLUCOSE 106*  BUN 34*  CREATININE 3.44*  CALCIUM 10.0   GFR: Estimated Creatinine Clearance: 23.4 mL/min (A) (by C-G formula based on SCr of 3.44 mg/dL (H)).  Liver Function Tests: Recent Labs  Lab 07/11/21 1034  AST 84*  ALT 60*  ALKPHOS 83  BILITOT 0.9  PROT 8.0  ALBUMIN 4.3   Recent Labs  Lab 07/11/21 1034  LIPASE 33   Urine analysis:    Component Value Date/Time   COLORURINE YELLOW (A) 06/13/2021 1341   APPEARANCEUR HAZY (A) 06/13/2021 1341   LABSPEC 1.009 06/13/2021 1341   PHURINE 5.0 06/13/2021 1341   GLUCOSEU NEGATIVE 06/13/2021 1341   HGBUR MODERATE (A) 06/13/2021 1341   BILIRUBINUR NEGATIVE 06/13/2021 1341   KETONESUR NEGATIVE 06/13/2021 1341   PROTEINUR NEGATIVE 06/13/2021 1341   NITRITE NEGATIVE 06/13/2021 1341   LEUKOCYTESUR NEGATIVE 06/13/2021 1341   Dr. Sedalia Muta [facility]  If 7PM-7AM, please contact overnight-coverage provider If 7AM-7PM, please contact day coverage provider [email or web address]  07/11/2021, 6:13 PM

## 2021-07-11 NOTE — Assessment & Plan Note (Signed)
-   Holding home enalapril 10 mg daily, metoprolol succinate 50 mg p.o. twice daily due to hypotension on presentation requiring fluid bolus and enalapril due to AKI - Hydralazine 10 mg every 6 hours as needed for SBP greater than 175, 4 days ordered - AM team to resume home antihypertensive medications when appropriate

## 2021-07-11 NOTE — ED Triage Notes (Signed)
[facility] reports pt was walking down the street and began having severe abdominal pain on the right side, pt diaphoretic upon EMS arrival.

## 2021-07-11 NOTE — Assessment & Plan Note (Signed)
-   Patient is no longer taking lithium 300 mg p.o. twice daily

## 2021-07-11 NOTE — Hospital Course (Addendum)
[name] is a 61 year old male with history of hypertension, neuropathy, history of appendectomy in May 2023, insomnia, cocaine use, who presents to the emergency department for chief concerns of nausea vomiting diarrhea.  Initial vitals in the emergency department showed temperature of 98.3, respiration rate of 16, heart rate 96, blood pressure 84/55, SPO2 of 92% on room air.  Serum sodium 137, potassium 2.7, chloride 101, bicarb 21, BUN of 34, serum creatinine of 3.44, nonfasting blood glucose 106, GFR of 20, WBC 10.4, hemoglobin 13.3, platelets of 313.  AST was 84, ALT was 60.  ED treatment: Morphine 4 mg IV one-time dose was ordered however was held due to hypotension.  LR 2 L bolus was ordered and given.  Sodium chloride 1 L bolus was ordered and given.  LR 150 mL/h was ordered.

## 2021-07-12 DIAGNOSIS — F1490 Cocaine use, unspecified, uncomplicated: Secondary | ICD-10-CM | POA: Diagnosis not present

## 2021-07-12 DIAGNOSIS — R112 Nausea with vomiting, unspecified: Secondary | ICD-10-CM | POA: Diagnosis not present

## 2021-07-12 DIAGNOSIS — F1010 Alcohol abuse, uncomplicated: Secondary | ICD-10-CM

## 2021-07-12 DIAGNOSIS — N179 Acute kidney failure, unspecified: Secondary | ICD-10-CM | POA: Diagnosis not present

## 2021-07-12 LAB — BASIC METABOLIC PANEL
Anion gap: 7 (ref 5–15)
BUN: 21 mg/dL — ABNORMAL HIGH (ref 6–20)
CO2: 21 mmol/L — ABNORMAL LOW (ref 22–32)
Calcium: 8.7 mg/dL — ABNORMAL LOW (ref 8.9–10.3)
Chloride: 113 mmol/L — ABNORMAL HIGH (ref 98–111)
Creatinine, Ser: 1.38 mg/dL — ABNORMAL HIGH (ref 0.61–1.24)
GFR, Estimated: 59 mL/min — ABNORMAL LOW (ref >60)
Glucose, Bld: 90 mg/dL (ref 70–99)
Potassium: 3.8 mmol/L (ref 3.5–5.1)
Sodium: 141 mmol/L (ref 135–145)

## 2021-07-12 LAB — GASTROINTESTINAL PANEL BY PCR, STOOL (REPLACES STOOL CULTURE)

## 2021-07-12 LAB — URINE DRUG SCREEN, QUALITATIVE (ARMC ONLY)
Amphetamines, Ur Screen: NOT DETECTED
Barbiturates, Ur Screen: NOT DETECTED
Benzodiazepine, Ur Scrn: NOT DETECTED
Cannabinoid 50 Ng, Ur ~~LOC~~: NOT DETECTED
Cocaine Metabolite,Ur ~~LOC~~: POSITIVE — AB
MDMA (Ecstasy)Ur Screen: NOT DETECTED
Methadone Scn, Ur: NOT DETECTED
Opiate, Ur Screen: NOT DETECTED
Phencyclidine (PCP) Ur S: NOT DETECTED
Tricyclic, Ur Screen: POSITIVE — AB

## 2021-07-12 LAB — C DIFFICILE QUICK SCREEN W PCR REFLEX
C Diff antigen: NEGATIVE
C Diff interpretation: NOT DETECTED
C Diff toxin: NEGATIVE

## 2021-07-12 LAB — URINALYSIS, COMPLETE (UACMP) WITH MICROSCOPIC
Bacteria, UA: NONE SEEN
Bilirubin Urine: NEGATIVE
Glucose, UA: NEGATIVE mg/dL
Hgb urine dipstick: NEGATIVE
Ketones, ur: NEGATIVE mg/dL
Leukocytes,Ua: NEGATIVE
Nitrite: NEGATIVE
Protein, ur: NEGATIVE mg/dL
Specific Gravity, Urine: 1.011 (ref 1.005–1.030)
Squamous Epithelial / HPF: NONE SEEN (ref 0–5)
pH: 5 (ref 5.0–8.0)

## 2021-07-12 LAB — CBC
HCT: 31.3 % — ABNORMAL LOW (ref 39.0–52.0)
Hemoglobin: 10.3 g/dL — ABNORMAL LOW (ref 13.0–17.0)
MCH: 29.3 pg (ref 26.0–34.0)
MCHC: 32.9 g/dL (ref 30.0–36.0)
MCV: 88.9 fL (ref 80.0–100.0)
Platelets: 220 10*3/uL (ref 150–400)
RBC: 3.52 MIL/uL — ABNORMAL LOW (ref 4.22–5.81)
RDW: 15.9 % — ABNORMAL HIGH (ref 11.5–15.5)
WBC: 5.5 10*3/uL (ref 4.0–10.5)
nRBC: 0 % (ref 0.0–0.2)

## 2021-07-12 MED ORDER — ADULT MULTIVITAMIN W/MINERALS CH
1.0000 | ORAL_TABLET | Freq: Every day | ORAL | Status: DC
  Administered 2021-07-12 – 2021-07-15 (×4): 1 via ORAL
  Filled 2021-07-12 (×4): qty 1

## 2021-07-12 MED ORDER — LORAZEPAM 2 MG/ML IJ SOLN
1.0000 mg | INTRAMUSCULAR | Status: AC | PRN
  Administered 2021-07-14 (×2): 2 mg via INTRAVENOUS
  Filled 2021-07-12 (×2): qty 1

## 2021-07-12 MED ORDER — SODIUM CHLORIDE 0.9 % IV SOLN: INTRAVENOUS | Status: DC

## 2021-07-12 MED ORDER — LORAZEPAM 1 MG PO TABS
1.0000 mg | ORAL_TABLET | ORAL | Status: AC | PRN
  Administered 2021-07-12: 1 mg via ORAL
  Administered 2021-07-12 – 2021-07-14 (×5): 2 mg via ORAL
  Filled 2021-07-12 (×5): qty 2
  Filled 2021-07-12: qty 1

## 2021-07-12 NOTE — Progress Notes (Signed)
Per Psych NP patient does not require suicide sitter or precautions. MD made aware.

## 2021-07-12 NOTE — Plan of Care (Signed)

## 2021-07-12 NOTE — Progress Notes (Addendum)
PROGRESS NOTE    [name]  HDQ:[id number] DOB: 1960/11/14 DOA: 07/11/2021 PCP: [facility]    Assessment & Plan:   Principal Problem:   Intractable nausea and vomiting Active Problems:   Major depressive disorder, recurrent episode, moderate (HCC)   Essential hypertension   Bipolar disorder (HCC)   Alcohol abuse   Cluster B personality disorder (HCC)   AKI (acute kidney injury) (HCC)   Cocaine use  Assessment and Plan: Likely gastroenteritis: w/ nausea, vomiting & diarrhea. GI PCR panel, c. diff ordered. COVID19 was neg    Major depressive disorder: moderate as per admitting physician. No longer taking fluoxetine  Suicidal ideations: continue w/ sitter. Psych consulted     Alcohol abuse: drinks approx 6 pack of beer every other day. Started on CIWA protocol   HTN: holding all home anti-HTN secondary to low normal BP    Bipolar disorder: no longer taking lithium    AKI on CKDIIIa: likely prerenal secondary to GI loss. Continue on IVFs. Cr is trending down today        DVT prophylaxis: heparin  Code Status: full  Family Communication:  Disposition Plan: unclear   Level of care: Telemetry Medical  Status is: Observation The patient remains OBS appropriate and will d/c before 2 midnights.    Consultants:  Pysch   Procedures:  Antimicrobials:    Subjective: Pt c/o pain all over his body & nausea   Objective: Vitals:   07/11/21 1515 07/11/21 1612 07/11/21 2056 07/12/21 0335  BP: 108/70 94/64 (!) 91/55 (!) 92/58  Pulse: 90 86 99 89  Resp: 12 16 16 18   Temp:  97.6 F (36.4 C) 98.3 F (36.8 C) 97.6 F (36.4 C)  TempSrc:    Oral  SpO2: 94% 99% 94% 95%  Weight:      Height:        Intake/Output Summary (Last 24 hours) at 07/12/2021 0745 Last data filed at 07/12/2021 09/12/2021 Gross per 24 hour  Intake 1740.24 ml  Output 500 ml  Net 1240.24 ml   Filed Weights   07/11/21 1024  Weight: 77.1 kg    Examination:  General  exam: Appears calm and comfortable  Respiratory system: Clear to auscultation. Respiratory effort normal. Cardiovascular system: S1 & S2 +. No rubs, gallops or clicks.  Gastrointestinal system: Abdomen is nondistended, soft and nontender. Normal bowel sounds heard. Central nervous system: Alert and oriented. Moves all extremities  Psychiatry: Judgement and insight appear normal. Flat mood and affect     Data Reviewed: I have personally reviewed following labs and imaging studies  CBC: Recent Labs  Lab 07/11/21 1034 07/12/21 0335  WBC 10.4 5.5  NEUTROABS 6.6  --   HGB 13.4 10.3*  HCT 40.6 31.3*  MCV 87.9 88.9  PLT 313 220   Basic Metabolic Panel: Recent Labs  Lab 07/11/21 1034 07/12/21 0335  NA 137 141  K 3.7 3.8  CL 101 113*  CO2 21* 21*  GLUCOSE 106* 90  BUN 34* 21*  CREATININE 3.44* 1.38*  CALCIUM 10.0 8.7*   GFR: Estimated Creatinine Clearance: 58.3 mL/min (A) (by C-G formula based on SCr of 1.38 mg/dL (H)). Liver Function Tests: Recent Labs  Lab 07/11/21 1034  AST 84*  ALT 60*  ALKPHOS 83  BILITOT 0.9  PROT 8.0  ALBUMIN 4.3   Recent Labs  Lab 07/11/21 1034  LIPASE 33   No results for input(s): "AMMONIA" in the last 168 hours. Coagulation Profile: No results for  input(s): "INR", "PROTIME" in the last 168 hours. Cardiac Enzymes: No results for input(s): "CKTOTAL", "CKMB", "CKMBINDEX", "TROPONINI" in the last 168 hours. BNP (last 3 results) No results for input(s): "PROBNP" in the last 8760 hours. HbA1C: No results for input(s): "HGBA1C" in the last 72 hours. CBG: No results for input(s): "GLUCAP" in the last 168 hours. Lipid Profile: No results for input(s): "CHOL", "HDL", "LDLCALC", "TRIG", "CHOLHDL", "LDLDIRECT" in the last 72 hours. Thyroid Function Tests: No results for input(s): "TSH", "T4TOTAL", "FREET4", "T3FREE", "THYROIDAB" in the last 72 hours. Anemia Panel: No results for input(s): "VITAMINB12", "FOLATE", "FERRITIN", "TIBC", "IRON",  "RETICCTPCT" in the last 72 hours. Sepsis Labs: No results for input(s): "PROCALCITON", "LATICACIDVEN" in the last 168 hours.  Recent Results (from the past 240 hour(s))  SARS Coronavirus 2 by RT PCR (hospital order, performed in [facility] hospital lab) *cepheid single result test* Anterior Nasal Swab     Status: None   Collection Time: 07/11/21  2:48 PM   Specimen: Anterior Nasal Swab  Result Value Ref Range Status   SARS Coronavirus 2 by RT PCR NEGATIVE NEGATIVE Final    Comment: (NOTE) SARS-CoV-2 target nucleic acids are NOT DETECTED.  The SARS-CoV-2 RNA is generally detectable in upper and lower respiratory specimens during the acute phase of infection. The lowest concentration of SARS-CoV-2 viral copies this assay can detect is 250 copies / mL. A negative result does not preclude SARS-CoV-2 infection and should not be used as the sole basis for treatment or other patient management decisions.  A negative result may occur with improper specimen collection / handling, submission of specimen other than nasopharyngeal swab, presence of viral mutation(s) within the areas targeted by this assay, and inadequate number of viral copies (<250 copies / mL). A negative result must be combined with clinical observations, patient history, and epidemiological information.  Fact Sheet for Patients:   RoadLapTop.co.za  Fact Sheet for Healthcare Providers: http://kim-miller.com/  This test is not yet approved or  cleared by the [place] FDA and has been authorized for detection and/or diagnosis of SARS-CoV-2 by FDA under an Emergency Use Authorization (EUA).  This EUA will remain in effect (meaning this test can be used) for the duration of the COVID-19 declaration under Section 564(b)(1) of the Act, 21 U.S.C. section 360bbb-3(b)(1), unless the authorization is terminated or revoked sooner.  Performed at [facility], [address], [place], [place] [address]          Radiology Studies: CT ABDOMEN PELVIS WO CONTRAST  Result Date: 07/11/2021 CLINICAL DATA:  Nausea, vomiting, RIGHT lower quadrant pain, severe abdominal pain 1 and diaphoresis, recent history of appendicitis with surgery EXAM: CT ABDOMEN AND PELVIS WITHOUT CONTRAST TECHNIQUE: Multidetector CT imaging of the abdomen and pelvis was performed following the standard protocol without IV contrast. RADIATION DOSE REDUCTION: This exam was performed according to the departmental dose-optimization program which includes automated exposure control, adjustment of the mA and/or kV according to patient size and/or use of iterative reconstruction technique. COMPARISON:  06/13/2021 FINDINGS: Lower chest: Minimal dependent density at the posterior lower lobes. Lung bases otherwise clear. Hepatobiliary: Gallbladder and liver normal appearance Pancreas: Normal appearance Spleen: Normal appearance Adrenals/Urinary Tract: Tiny BILATERAL nonobstructing renal calculi. Adrenal glands, kidneys, ureters, and bladder otherwise normal appearance. Stomach/Bowel: Appendix surgically absent. Stomach and bowel loops normal appearance Vascular/Lymphatic: Atherosclerotic calcifications aorta and iliac arteries without aneurysm. No adenopathy. Reproductive: Unremarkable prostate gland and seminal vesicles Other: No free air or free fluid. No hernia or  inflammatory process. Minimal postsurgical changes of the soft tissues at the umbilicus. Musculoskeletal: Osseous structures unremarkable. IMPRESSION: Post appendectomy. No acute intra-abdominal or intrapelvic abnormalities. Aortic Atherosclerosis (ICD10-I70.0). Electronically Signed   By: Ulyses Southward M.D.   On: 07/11/2021 12:10        Scheduled Meds:  doxepin  100 mg Oral QHS   folic acid  1 mg Oral Daily   gabapentin  400 mg Oral TID   heparin  5,000 Units Subcutaneous Q8H    morphine injection  4 mg Intravenous Once   thiamine  100 mg Oral  Daily   Continuous Infusions:  promethazine (PHENERGAN) injection (IM or IVPB)       LOS: 0 days    Time spent: 35 mins    Charise Killian, MD [facility] Pager [phone]  If 7PM-7AM, please contact night-coverage [email or web address] 07/12/2021, 7:45 AM

## 2021-07-12 NOTE — Consult Note (Signed)
[facility] Face-to-Face Psychiatry Consult   Reason for Consult:  passive suicidal thoughts related to his cocaine use Referring Physician:  Mayford Knife Patient Identification: [name] MRN:  [id number] Principal Diagnosis: Cocaine use Diagnosis:  Principal Problem:   Cocaine use Active Problems:   Major depressive disorder, recurrent episode, moderate (HCC)   Bipolar disorder (HCC)   Essential hypertension   Alcohol abuse   Cluster B personality disorder (HCC)   AKI (acute kidney injury) (HCC)   Intractable nausea and vomiting   Total Time spent with patient: 45 minutes  Subjective: "I just need to stop using cocaine."    [name] is a 61 y.o. male patient admitted with abdominal pain and vomiting.  HPI:  Patient is well-known to the psychiatry service, having 4 hospitalizations since January [address]4765, the last one when he was transferred to [facility] 06/29/21.   On evaluation, patient is alert, oriented x 4. He denies active suicidal ideation, stating that he only "feels like giving up when he continues to use drugs." He admits that psychiatric hospitalizations have not been of benefit to him because he doesn't stay on his meds and relapses into drug use each time he is discharged.  Patient is clear, coherent, speaking in linear sentences. Denies auditory or visual hallucinations and does not appear to be responding to internal stimuli. He denies thoughts or intent of self-harm to himself or others. He states that he feels safe in the hospital. Patient does not meet criteria for involuntary commitment and is requesting that he is discharged to inpatient substance use rehab when he is medically cleared for discharge. He states that he is committed to stop using substances and wants help to do that. He shows fair insight.  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 echo 5/17: EF 60-65%, no RWMA, LV dias fxn nl, LA mildly dilated, RV sys fxn nl, PASP nl, moderate sized circumferential pericardial effusion was identified, 2.12 cm around the LV free wall, <1 cm around the RV free wall. Features were not c/w tamponade physiology   PTSD (post-traumatic stress disorder)    Witnessed brother's suicide.   Restless leg syndrome    Seizures (HCC)    Syncope     Past Surgical History:  Procedure Laterality Date   CYSTOSCOPY WITH URETEROSCOPY AND STENT PLACEMENT     ESOPHAGOGASTRODUODENOSCOPY N/A 01/11/2016   Procedure: ESOPHAGOGASTRODUODENOSCOPY (EGD);  Surgeon: Charlott Rakes, MD;  Location: [facility] ENDOSCOPY;  Service: Endoscopy;  Laterality: N/A;   ESOPHAGOGASTRODUODENOSCOPY N/A 04/09/2020   Procedure: ESOPHAGOGASTRODUODENOSCOPY (EGD);  Surgeon: [name], MD;  Location: [facility] ENDOSCOPY;  Service: Gastroenterology;  Laterality: N/A;   INCISION AND DRAINAGE ABSCESS Left 01/02/2018   Procedure: INCISION AND DRAINAGE LEFT KNEE;  Surgeon: Deeann Saint, MD;  Location: [facility] ORS;  Service: Orthopedics;  Laterality: Left;   JOINT REPLACEMENT Right    TKR  KNEE ARTHROSCOPY Right 06/25/2014   Procedure: ARTHROSCOPY KNEE;  Surgeon: Deeann Saint, MD;  Location: [facility] ORS;  Service: Orthopedics;  Laterality: Right;  partial arthroscopic medial menisectomy   LAPAROSCOPIC APPENDECTOMY N/A 06/02/2021   Procedure: APPENDECTOMY LAPAROSCOPIC;  Surgeon: Campbell Lerner, MD;  Location: [facility] ORS;  Service: General;  Laterality: N/A;   TOTAL KNEE ARTHROPLASTY Right  04/22/2015   Procedure: TOTAL KNEE ARTHROPLASTY;  Surgeon: Deeann Saint, MD;  Location: [facility] ORS;  Service: Orthopedics;  Laterality: Right;   TOTAL KNEE ARTHROPLASTY Left 10/30/2017   Procedure: TOTAL KNEE ARTHROPLASTY;  Surgeon: Deeann Saint, MD;  Location: [facility] ORS;  Service: Orthopedics;  Laterality: Left;   TOTAL KNEE REVISION Left 01/02/2018   Procedure: poly exchange of tibia and patella left knee;  Surgeon: Deeann Saint, MD;  Location: [facility] ORS;  Service: Orthopedics;  Laterality: Left;   UMBILICAL HERNIA REPAIR  06/02/2021   Procedure: HERNIA REPAIR UMBILICAL ADULT;  Surgeon: Campbell Lerner, MD;  Location: [facility] ORS;  Service: General;;   Family History:  Family History  Problem Relation Age of Onset   CVA Mother        deceased at age 41   Depression Brother        Died by suicide at age 68   Family Psychiatric  History: none reported  Social History:  Social History   Substance and Sexual Activity  Alcohol Use Yes   Comment: rare     Social History   Substance and Sexual Activity  Drug Use Yes   Types: Marijuana, Cocaine    Social History   Socioeconomic History   Marital status: Single    Spouse name: Not on file   Number of children: Not on file   Years of education: Not on file   Highest education level: Not on file  Occupational History   Not on file  Tobacco Use   Smoking status: Former    Packs/day: 0.75    Years: 20.00    Total pack years: 15.00    Types: Cigarettes    Quit date: 05/16/1984    Years since quitting: 37.1   Smokeless tobacco: Never  Vaping Use   Vaping Use: Never used  Substance and Sexual Activity   Alcohol use: Yes    Comment: rare   Drug use: Yes    Types: Marijuana, Cocaine   Sexual activity: Not on file  Other Topics Concern   Not on file  Social History Narrative   Not on file   Social Determinants of Health   Financial Resource Strain: Not on file  Food Insecurity: Not on file  Transportation Needs: Not on  file  Physical Activity: Not on file  Stress: Not on file  Social Connections: Not on file   Additional Social History:    Allergies:  No Known Allergies  Labs:  Results for orders placed or performed during the hospital encounter of 07/11/21 (from the past 48 hour(s))  Comprehensive metabolic panel     Status: Abnormal   Collection Time: 07/11/21 10:34 AM  Result Value Ref Range   Sodium 137 135 - 145 mmol/L   Potassium 3.7 3.5 - 5.1 mmol/L   Chloride 101 98 - 111 mmol/L   CO2 21 (L) 22 - 32 mmol/L   Glucose, Bld 106 (H) 70 - 99 mg/dL    Comment: Glucose reference range applies only to samples taken after fasting for at least 8 hours.   BUN 34 (H) 6 - 20 mg/dL   Creatinine,  Ser 3.44 (H) 0.61 - 1.24 mg/dL   Calcium 09.810.0 8.9 - 11.910.3 mg/dL   Total Protein 8.0 6.5 - 8.1 g/dL   Albumin 4.3 3.5 - 5.0 g/dL   AST 84 (H) 15 - 41 U/L   ALT 60 (H) 0 - 44 U/L   Alkaline Phosphatase 83 38 - 126 U/L   Total Bilirubin 0.9 0.3 - 1.2 mg/dL   GFR, Estimated 20 (L) >60 mL/min    Comment: (NOTE) Calculated using the CKD-EPI Creatinine Equation (2021)    Anion gap 15 5 - 15    Comment: Performed at [facility], [address] [place], [place] [address]  Lipase, blood     Status: None   Collection Time: 07/11/21 10:34 AM  Result Value Ref Range   Lipase 33 11 - 51 U/L    Comment: Performed at [facility], [address] [place], [place] [address]  CBC with Differential     Status: Abnormal   Collection Time: 07/11/21 10:34 AM  Result Value Ref Range   WBC 10.4 4.0 - 10.5 K/uL   RBC 4.62 4.22 - 5.81 MIL/uL   Hemoglobin 13.4 13.0 - 17.0 g/dL   HCT 30.840.6 65.739.0 - 84.652.0 %   MCV 87.9 80.0 - 100.0 fL   MCH 29.0 26.0 - 34.0 pg   MCHC 33.0 30.0 - 36.0 g/dL   RDW 96.215.9 (H) 95.211.5 - 84.115.5 %   Platelets 313 150 - 400 K/uL   nRBC 0.0 0.0 - 0.2 %   Neutrophils Relative % 63 %   Neutro Abs 6.6 1.7 - 7.7 K/uL   Lymphocytes Relative 23 %   Lymphs Abs 2.4 0.7 - 4.0 K/uL   Monocytes  Relative 11 %   Monocytes Absolute 1.1 (H) 0.1 - 1.0 K/uL   Eosinophils Relative 2 %   Eosinophils Absolute 0.2 0.0 - 0.5 K/uL   Basophils Relative 1 %   Basophils Absolute 0.1 0.0 - 0.1 K/uL   Immature Granulocytes 0 %   Abs Immature Granulocytes 0.04 0.00 - 0.07 K/uL    Comment: Performed at [facility], [address] [place], [place] [address]  Ethanol     Status: None   Collection Time: 07/11/21  1:30 PM  Result Value Ref Range   Alcohol, Ethyl (B) <10 <10 mg/dL    Comment: (NOTE) Lowest detectable limit for serum alcohol is 10 mg/dL.  For medical purposes only. Performed at [facility], [address] [place], [place] [address]   SARS Coronavirus 2 by RT PCR (hospital order, performed in [facility] hospital lab) *cepheid single result test* Anterior Nasal Swab     Status: None   Collection Time: 07/11/21  2:48 PM   Specimen: Anterior Nasal Swab  Result Value Ref Range   SARS Coronavirus 2 by RT PCR NEGATIVE NEGATIVE    Comment: (NOTE) SARS-CoV-2 target nucleic acids are NOT DETECTED.  The SARS-CoV-2 RNA is generally detectable in upper and lower respiratory specimens during the acute phase of infection. The lowest concentration of SARS-CoV-2 viral copies this assay can detect is 250 copies / mL. A negative result does not preclude SARS-CoV-2 infection and should not be used as the sole basis for treatment or other patient management decisions.  A negative result may occur with improper specimen collection / handling, submission of specimen other than nasopharyngeal swab, presence of viral mutation(s) within the areas targeted by this assay, and inadequate number of viral copies (<250 copies / mL).  A negative result must be combined with clinical observations, patient history, and epidemiological information.  Fact Sheet for Patients:   RoadLapTop.co.za  Fact Sheet for Healthcare  Providers: http://kim-miller.com/  This test is not yet approved or  cleared by the [place] FDA and has been authorized for detection and/or diagnosis of SARS-CoV-2 by FDA under an Emergency Use Authorization (EUA).  This EUA will remain in effect (meaning this test can be used) for the duration of the COVID-19 declaration under Section 564(b)(1) of the Act, 21 U.S.C. section 360bbb-3(b)(1), unless the authorization is terminated or revoked sooner.  Performed at [facility], [address], [place], [place] [address]   Urine Drug Screen, Qualitative [facility] only)     Status: Abnormal   Collection Time: 07/12/21  3:20 AM  Result Value Ref Range   Tricyclic, Ur Screen POSITIVE (A) NONE DETECTED   Amphetamines, Ur Screen NONE DETECTED NONE DETECTED   MDMA (Ecstasy)Ur Screen NONE DETECTED NONE DETECTED   Cocaine Metabolite,Ur [place] POSITIVE (A) NONE DETECTED   Opiate, Ur Screen NONE DETECTED NONE DETECTED   Phencyclidine (PCP) Ur S NONE DETECTED NONE DETECTED   Cannabinoid 50 Ng, Ur [place] NONE DETECTED NONE DETECTED   Barbiturates, Ur Screen NONE DETECTED NONE DETECTED   Benzodiazepine, Ur Scrn NONE DETECTED NONE DETECTED   Methadone Scn, Ur NONE DETECTED NONE DETECTED    Comment: (NOTE) Tricyclics + metabolites, urine    Cutoff 1000 ng/mL Amphetamines + metabolites, urine  Cutoff 1000 ng/mL MDMA (Ecstasy), urine              Cutoff 500 ng/mL Cocaine Metabolite, urine          Cutoff 300 ng/mL Opiate + metabolites, urine        Cutoff 300 ng/mL Phencyclidine (PCP), urine         Cutoff 25 ng/mL Cannabinoid, urine                 Cutoff 50 ng/mL Barbiturates + metabolites, urine  Cutoff 200 ng/mL Benzodiazepine, urine              Cutoff 200 ng/mL Methadone, urine                   Cutoff 300 ng/mL  The urine drug screen provides only a preliminary, unconfirmed analytical test result and should not be used for non-medical purposes. Clinical  consideration and professional judgment should be applied to any positive drug screen result due to possible interfering substances. A more specific alternate chemical method must be used in order to obtain a confirmed analytical result. Gas chromatography / mass spectrometry (GC/MS) is the preferred confirm atory method. Performed at [facility], [address] [place], [place] [address]   Urinalysis, Complete w Microscopic     Status: Abnormal   Collection Time: 07/12/21  3:20 AM  Result Value Ref Range   Color, Urine YELLOW (A) YELLOW   APPearance CLEAR (A) CLEAR   Specific Gravity, Urine 1.011 1.005 - 1.030   pH 5.0 5.0 - 8.0   Glucose, UA NEGATIVE NEGATIVE mg/dL   Hgb urine dipstick NEGATIVE NEGATIVE   Bilirubin Urine NEGATIVE NEGATIVE   Ketones, ur NEGATIVE NEGATIVE mg/dL   Protein, ur NEGATIVE NEGATIVE mg/dL   Nitrite NEGATIVE NEGATIVE   Leukocytes,Ua NEGATIVE NEGATIVE   WBC, UA 0-5 0 - 5 WBC/hpf   Bacteria, UA NONE SEEN NONE SEEN   Squamous Epithelial / LPF NONE SEEN 0 - 5  Comment: Performed at [facility], [address] [place], [place] [address]  Basic metabolic panel     Status: Abnormal   Collection Time: 07/12/21  3:35 AM  Result Value Ref Range   Sodium 141 135 - 145 mmol/L   Potassium 3.8 3.5 - 5.1 mmol/L   Chloride 113 (H) 98 - 111 mmol/L   CO2 21 (L) 22 - 32 mmol/L   Glucose, Bld 90 70 - 99 mg/dL    Comment: Glucose reference range applies only to samples taken after fasting for at least 8 hours.   BUN 21 (H) 6 - 20 mg/dL   Creatinine, Ser 1.96 (H) 0.61 - 1.24 mg/dL    Comment: RESULTS VERIFIED BY REPEAT TESTING DLB   Calcium 8.7 (L) 8.9 - 10.3 mg/dL   GFR, Estimated 59 (L) >60 mL/min    Comment: (NOTE) Calculated using the CKD-EPI Creatinine Equation (2021)    Anion gap 7 5 - 15    Comment: Performed at [facility], [address] [place], [place] [address]  CBC     Status: Abnormal   Collection Time: 07/12/21   3:35 AM  Result Value Ref Range   WBC 5.5 4.0 - 10.5 K/uL   RBC 3.52 (L) 4.22 - 5.81 MIL/uL   Hemoglobin 10.3 (L) 13.0 - 17.0 g/dL   HCT 98.9 (L) 21.1 - 94.1 %   MCV 88.9 80.0 - 100.0 fL   MCH 29.3 26.0 - 34.0 pg   MCHC 32.9 30.0 - 36.0 g/dL   RDW 74.0 (H) 81.4 - 48.1 %   Platelets 220 150 - 400 K/uL   nRBC 0.0 0.0 - 0.2 %    Comment: Performed at [facility], [address], [place], [place] [address]  C Difficile Quick Screen w PCR reflex     Status: None   Collection Time: 07/12/21  8:00 AM  Result Value Ref Range   C Diff antigen NEGATIVE NEGATIVE   C Diff toxin NEGATIVE NEGATIVE   C Diff interpretation No C. difficile detected.     Comment: Performed at [facility], [address] [place], [place] [address]  Gastrointestinal Panel by PCR , Stool     Status: None   Collection Time: 07/12/21  8:20 AM   Specimen: Stool  Result Value Ref Range   Campylobacter species NOT DETECTED NOT DETECTED   Plesimonas shigelloides NOT DETECTED NOT DETECTED   Salmonella species NOT DETECTED NOT DETECTED   Yersinia enterocolitica NOT DETECTED NOT DETECTED   Vibrio species NOT DETECTED NOT DETECTED   Vibrio cholerae NOT DETECTED NOT DETECTED   Enteroaggregative E coli (EAEC) NOT DETECTED NOT DETECTED   Enteropathogenic E coli (EPEC) NOT DETECTED NOT DETECTED   Enterotoxigenic E coli (ETEC) NOT DETECTED NOT DETECTED   Shiga like toxin producing E coli (STEC) NOT DETECTED NOT DETECTED   Shigella/Enteroinvasive E coli (EIEC) NOT DETECTED NOT DETECTED   Cryptosporidium NOT DETECTED NOT DETECTED   Cyclospora cayetanensis NOT DETECTED NOT DETECTED   Entamoeba histolytica NOT DETECTED NOT DETECTED   Giardia lamblia NOT DETECTED NOT DETECTED   Adenovirus F40/41 NOT DETECTED NOT DETECTED   Astrovirus NOT DETECTED NOT DETECTED   Norovirus GI/GII NOT DETECTED NOT DETECTED   Rotavirus A NOT DETECTED NOT DETECTED   Sapovirus (I, II, IV, and V) NOT DETECTED NOT DETECTED     Comment: Performed at [facility], [address], [place], [place] [address]    Current Facility-Administered Medications  Medication  Dose Route Frequency Provider Last Rate Last Admin   0.9 %  sodium chloride infusion   Intravenous Continuous Charise Killian, MD 75 mL/hr at 07/12/21 0811 New Bag at 07/12/21 0811   acetaminophen (TYLENOL) tablet 650 mg  650 mg Oral Q6H PRN Cox, Amy N, DO   650 mg at 07/12/21 1610   Or   acetaminophen (TYLENOL) suppository 650 mg  650 mg Rectal Q6H PRN Cox, Amy N, DO       doxepin (SINEQUAN) capsule 100 mg  100 mg Oral QHS Cox, Amy N, DO   100 mg at 07/11/21 2056   folic acid (FOLVITE) tablet 1 mg  1 mg Oral Daily Cox, Amy N, DO   1 mg at 07/12/21 9604   gabapentin (NEURONTIN) capsule 400 mg  400 mg Oral TID Cox, Amy N, DO   400 mg at 07/12/21 1535   heparin injection 5,000 Units  5,000 Units Subcutaneous Q8H Cox, Amy N, DO   5,000 Units at 07/12/21 1535   hydrALAZINE (APRESOLINE) tablet 10 mg  10 mg Oral Q6H PRN Cox, Amy N, DO       LORazepam (ATIVAN) tablet 1-4 mg  1-4 mg Oral Q1H PRN Charise Killian, MD   1 mg at 07/12/21 5409   Or   LORazepam (ATIVAN) injection 1-4 mg  1-4 mg Intravenous Q1H PRN Charise Killian, MD       morphine (PF) 4 MG/ML injection 4 mg  4 mg Intravenous Once Cox, Amy N, DO       multivitamin with minerals tablet 1 tablet  1 tablet Oral Daily Charise Killian, MD   1 tablet at 07/12/21 0933   ondansetron (ZOFRAN) tablet 4 mg  4 mg Oral Q6H PRN Cox, Amy N, DO   4 mg at 07/12/21 0815   Or   ondansetron (ZOFRAN) injection 4 mg  4 mg Intravenous Q6H PRN Cox, Amy N, DO       promethazine (PHENERGAN) 12.5 mg in sodium chloride 0.9 % 50 mL IVPB  12.5 mg Intravenous Q6H PRN Cox, Amy N, DO       thiamine tablet 100 mg  100 mg Oral Daily Cox, Amy N, DO   100 mg at 07/12/21 8119   traZODone (DESYREL) tablet 25 mg  25 mg Oral TID PRN Cox, Amy N, DO        Musculoskeletal: Strength & Muscle Tone: within normal  limits Gait & Station:  did not observe Patient leans: N/A    Psychiatric Specialty Exam:  Presentation  General Appearance: Appropriate for Environment  Eye Contact:Good  Speech:Clear and Coherent  Speech Volume:Normal  Handedness:Right   Mood and Affect  Mood:Euthymic  Affect:Congruent   Thought Process  Thought Processes:Coherent  Descriptions of Associations:Intact  Orientation:Full (Time, Place and Person)  Thought Content:WDL; Logical  History of Schizophrenia/Schizoaffective disorder:No  Duration of Psychotic Symptoms:N/A  Hallucinations:Hallucinations: None  Ideas of Reference:None  Suicidal Thoughts:Suicidal Thoughts: No  Homicidal Thoughts:Homicidal Thoughts: No   Sensorium  Memory:Immediate Good  Judgment:Poor  Insight:Good   Executive Functions  Concentration:Good  Attention Span:Good  Recall:Good  Fund of Knowledge:Fair  Language:Good   Psychomotor Activity  Psychomotor Activity:Psychomotor Activity: Normal   Assets  Assets:Communication Skills; Desire for Improvement; Financial Resources/Insurance; Resilience; Physical Health   Sleep  Sleep:Sleep: Fair   Physical Exam: Physical Exam Vitals and nursing note reviewed.  HENT:     Head: Normocephalic.     Nose: No congestion or rhinorrhea.  Eyes:  General:        Right eye: No discharge.        Left eye: No discharge.  Cardiovascular:     Rate and Rhythm: Normal rate.  Pulmonary:     Effort: Pulmonary effort is normal.  Neurological:     Mental Status: He is alert and oriented to person, place, and time.  Psychiatric:        Attention and Perception: Attention normal.        Mood and Affect: Mood normal.        Speech: Speech normal.        Behavior: Behavior is cooperative.        Thought Content: Thought content is not paranoid or delusional. Thought content does not include homicidal or suicidal ideation.        Cognition and Memory: Cognition  normal.        Judgment: Judgment is impulsive.    Review of Systems  HENT: Negative.    Respiratory: Negative.    Cardiovascular: Negative.   Psychiatric/Behavioral:  Positive for depression (chronic/stable) and substance abuse. Negative for hallucinations, memory loss and suicidal ideas. The patient is not nervous/anxious and does not have insomnia.    Blood pressure 121/74, pulse 86, temperature 98.2 F (36.8 C), temperature source Oral, resp. rate 16, height  (1.753 m), weight 77.1 kg, SpO2 95 %. Body mass index is 25.1 kg/m.  Treatment Plan Summary: Patient is being treated for medical problems and does not express active suicidal ideation. Patient has chronic passive suicidal thoughts which he reports are related to his cocaine use.Patient is requesting help to get into a rehabilitation program when he is medically discharged. Reviewed with Dr. Mayford Knife, who will refer him to social work/TOC for assistance with this goal.    Disposition: No evidence of imminent risk to self or others at present.   Patient does not meet criteria for psychiatric inpatient admission. Supportive therapy provided about ongoing stressors.  Vanetta Mulders, NP 07/12/2021 5:25 PM

## 2021-07-13 DIAGNOSIS — K529 Noninfective gastroenteritis and colitis, unspecified: Secondary | ICD-10-CM | POA: Diagnosis present

## 2021-07-13 DIAGNOSIS — Z818 Family history of other mental and behavioral disorders: Secondary | ICD-10-CM | POA: Diagnosis not present

## 2021-07-13 DIAGNOSIS — R45851 Suicidal ideations: Secondary | ICD-10-CM | POA: Diagnosis present

## 2021-07-13 DIAGNOSIS — F319 Bipolar disorder, unspecified: Secondary | ICD-10-CM | POA: Diagnosis present

## 2021-07-13 DIAGNOSIS — G2581 Restless legs syndrome: Secondary | ICD-10-CM | POA: Diagnosis present

## 2021-07-13 DIAGNOSIS — F4310 Post-traumatic stress disorder, unspecified: Secondary | ICD-10-CM | POA: Diagnosis present

## 2021-07-13 DIAGNOSIS — K219 Gastro-esophageal reflux disease without esophagitis: Secondary | ICD-10-CM | POA: Diagnosis present

## 2021-07-13 DIAGNOSIS — F6089 Other specific personality disorders: Secondary | ICD-10-CM | POA: Diagnosis present

## 2021-07-13 DIAGNOSIS — F3132 Bipolar disorder, current episode depressed, moderate: Secondary | ICD-10-CM

## 2021-07-13 DIAGNOSIS — I959 Hypotension, unspecified: Secondary | ICD-10-CM | POA: Diagnosis present

## 2021-07-13 DIAGNOSIS — Z96653 Presence of artificial knee joint, bilateral: Secondary | ICD-10-CM | POA: Diagnosis present

## 2021-07-13 DIAGNOSIS — I129 Hypertensive chronic kidney disease with stage 1 through stage 4 chronic kidney disease, or unspecified chronic kidney disease: Secondary | ICD-10-CM | POA: Diagnosis present

## 2021-07-13 DIAGNOSIS — Z5901 Sheltered homelessness: Secondary | ICD-10-CM | POA: Diagnosis not present

## 2021-07-13 DIAGNOSIS — F1020 Alcohol dependence, uncomplicated: Secondary | ICD-10-CM | POA: Diagnosis present

## 2021-07-13 DIAGNOSIS — F1010 Alcohol abuse, uncomplicated: Secondary | ICD-10-CM | POA: Diagnosis not present

## 2021-07-13 DIAGNOSIS — Z87891 Personal history of nicotine dependence: Secondary | ICD-10-CM | POA: Diagnosis not present

## 2021-07-13 DIAGNOSIS — N179 Acute kidney failure, unspecified: Secondary | ICD-10-CM | POA: Diagnosis present

## 2021-07-13 DIAGNOSIS — R112 Nausea with vomiting, unspecified: Secondary | ICD-10-CM | POA: Diagnosis present

## 2021-07-13 DIAGNOSIS — D649 Anemia, unspecified: Secondary | ICD-10-CM | POA: Diagnosis present

## 2021-07-13 DIAGNOSIS — Z79899 Other long term (current) drug therapy: Secondary | ICD-10-CM | POA: Diagnosis not present

## 2021-07-13 DIAGNOSIS — N1831 Chronic kidney disease, stage 3a: Secondary | ICD-10-CM | POA: Diagnosis present

## 2021-07-13 DIAGNOSIS — F1490 Cocaine use, unspecified, uncomplicated: Secondary | ICD-10-CM | POA: Diagnosis present

## 2021-07-13 DIAGNOSIS — Z823 Family history of stroke: Secondary | ICD-10-CM | POA: Diagnosis not present

## 2021-07-13 DIAGNOSIS — Z9049 Acquired absence of other specified parts of digestive tract: Secondary | ICD-10-CM | POA: Diagnosis not present

## 2021-07-13 DIAGNOSIS — Z20822 Contact with and (suspected) exposure to covid-19: Secondary | ICD-10-CM | POA: Diagnosis present

## 2021-07-13 LAB — COMPREHENSIVE METABOLIC PANEL
ALT: 40 U/L (ref 0–44)
AST: 33 U/L (ref 15–41)
Albumin: 2.8 g/dL — ABNORMAL LOW (ref 3.5–5.0)
Alkaline Phosphatase: 57 U/L (ref 38–126)
Anion gap: 3 — ABNORMAL LOW (ref 5–15)
BUN: 13 mg/dL (ref 6–20)
CO2: 22 mmol/L (ref 22–32)
Calcium: 8.7 mg/dL — ABNORMAL LOW (ref 8.9–10.3)
Chloride: 115 mmol/L — ABNORMAL HIGH (ref 98–111)
Creatinine, Ser: 1.17 mg/dL (ref 0.61–1.24)
GFR, Estimated: >60 mL/min (ref >60)
Glucose, Bld: 88 mg/dL (ref 70–99)
Potassium: 3.7 mmol/L (ref 3.5–5.1)
Sodium: 140 mmol/L (ref 135–145)
Total Bilirubin: 0.6 mg/dL (ref 0.3–1.2)
Total Protein: 5.8 g/dL — ABNORMAL LOW (ref 6.5–8.1)

## 2021-07-13 LAB — CBC
HCT: 29.3 % — ABNORMAL LOW (ref 39.0–52.0)
Hemoglobin: 9.6 g/dL — ABNORMAL LOW (ref 13.0–17.0)
MCH: 29.3 pg (ref 26.0–34.0)
MCHC: 32.8 g/dL (ref 30.0–36.0)
MCV: 89.3 fL (ref 80.0–100.0)
Platelets: 197 10*3/uL (ref 150–400)
RBC: 3.28 MIL/uL — ABNORMAL LOW (ref 4.22–5.81)
RDW: 15.4 % (ref 11.5–15.5)
WBC: 4 10*3/uL (ref 4.0–10.5)
nRBC: 0 % (ref 0.0–0.2)

## 2021-07-13 NOTE — TOC Initial Note (Signed)
Transition of Care [facility]) - Initial/Assessment Note    Patient Details  Name: [name] MRN: [id number] Date of Birth: 06/20/1960  [facility] CM/SW Contact:    Alberteen Sam, LCSW Phone Number: 07/13/2021, 11:24 AM  Clinical Narrative:                  CSW met with patient at bedside for rehab resources consult. Patient agreeable to substance abuse resource list, list provided of outpatient and inpatient resource options. Patient reports he continues to go to [facility] for PCP needs, reports using [facility] in [place] for his pharmacy. States at time of discharge he plans to take the bus, confirms he has a bus pass to get home.   No identified discharge needs at this time.   Expected Discharge Plan: Home/Self Care Barriers to Discharge: Continued Medical Work up   Patient Goals and CMS Choice Patient states their goals for this hospitalization and ongoing recovery are:: to go home CMS Medicare.gov Compare Post Acute Care list provided to:: Patient Choice offered to / list presented to : Patient  Expected Discharge Plan and Services Expected Discharge Plan: Home/Self Care       Living arrangements for the past 2 months: Single Family Home                                      Prior Living Arrangements/Services Living arrangements for the past 2 months: Single Family Home Lives with:: Self                   Activities of Daily Living Home Assistive Devices/Equipment: None ADL Screening (condition at time of admission) Patient's cognitive ability adequate to safely complete daily activities?: Yes Is the patient deaf or have difficulty hearing?: No Does the patient have difficulty seeing, even when wearing glasses/contacts?: No Does the patient have difficulty concentrating, remembering, or making decisions?: No Patient able to express need for assistance with ADLs?: Yes Does the patient have difficulty dressing or bathing?:  No Independently performs ADLs?: Yes (appropriate for developmental age) Does the patient have difficulty walking or climbing stairs?: No Weakness of Legs: Both Weakness of Arms/Hands: Right  Permission Sought/Granted                  Emotional Assessment Appearance:: Appears stated age   Affect (typically observed): Calm Orientation: : Oriented to Self, Oriented to Place, Oriented to  Time, Oriented to Situation Alcohol / Substance Use: Not Applicable Psych Involvement: No (comment)  Admission diagnosis:  Intractable nausea and vomiting [R11.2] Patient Active Problem List   Diagnosis Date Noted   Intractable nausea and vomiting 07/11/2021   Malingering 06/28/2021   Cocaine use    Laceration of left forearm    Major depressive disorder, recurrent severe without psychotic features ([place]) 06/18/2021   Septic shock ([place]) 06/13/2021   AKI (acute kidney injury) ([place])    Non-traumatic rhabdomyolysis    Acute appendicitis 06/02/2021   Cellulitis of right hand 05/29/2021   Effusion of bursa of left knee 05/29/2021   Depression 05/29/2021   Major depressive disorder, recurrent episode, moderate ([place]) 05/15/2021   Cluster B personality disorder ([place]) 05/03/2021   Bipolar disorder ([place]) 01/18/2021   Essential hypertension 01/18/2021   Alcohol abuse 01/18/2021   PCP:  [facility], [place]:   [facility] [place], [place]  [address] AT [facility] [address] [place] [address] Phone: [phone] Fax: [phone]  [place] [address] Phone: [phone] Fax: [phone]     Social Determinants of Health (SDOH) Interventions    Readmission Risk Interventions    06/16/2021   10:20 AM  Readmission Risk Prevention Plan  Transportation Screening Complete  Medication Review (RN Care Manager) Complete  PCP or Specialist appointment within 3-5 days of  discharge Complete  SW Recovery Care/Counseling Consult Complete  [place] Not Applicable

## 2021-07-13 NOTE — Progress Notes (Signed)
PROGRESS NOTE   HPI was taken from Dr. Sedalia Muta: [name] is a 61 year old male with history of hypertension, neuropathy, history of appendectomy in May 2023, insomnia, cocaine use, who presents to the emergency department for chief concerns of nausea vomiting diarrhea.  Initial vitals in the emergency department showed temperature of 98.3, respiration rate of 16, heart rate 96, blood pressure 84/55, SPO2 of 92% on room air.   Serum sodium 137, potassium 2.7, chloride 101, bicarb 21, BUN of 34, serum creatinine of 3.44, nonfasting blood glucose 106, GFR of 20, WBC 10.4, hemoglobin 13.3, platelets of 313.  AST was 84, ALT was 60.  ED treatment: Morphine 4 mg IV one-time dose was ordered however was held due to hypotension.  LR 2 L bolus was ordered and given.  Sodium chloride 1 L bolus was ordered and given.  LR 150 mL/h was ordered.   At bedside he is alert oriented to self, age, current year, current location.   He reports that he started having nausea, vomiting, loose and watery bowel movements about 2 days ago. He denies change in diet.  He denies other people in his household being sick with the same.  He denies known sick contacts.   He denies recent antibiotic use. He denies fever, chest pain. He endorses shortness of breath. He deies recent trauma.    Social history: He lives with friends. He denies tobacco, etoh. He snorted cocaine last week.    [name]  JOI:[id number] DOB: 05/05/1960 DOA: 07/11/2021 PCP: [facility]    Assessment & Plan:   Principal Problem:   Cocaine use Active Problems:   Major depressive disorder, recurrent episode, moderate (HCC)   Essential hypertension   Bipolar disorder (HCC)   Alcohol abuse   Cluster B personality disorder (HCC)   AKI (acute kidney injury) (HCC)   Intractable nausea and vomiting  Assessment and Plan: Likely gastroenteritis: w/ nausea, vomiting & diarrhea. GI PCR panel, c. diff are both neg. But  still w/ c/o a lot of diarrhea. COVID19 was neg    Major depressive disorder: moderate as per admitting physician.  No longer taking fluoxetine   Suicidal ideations: chronic passive suicidal ideations as per psych. Does not meet inpatient psych criteria as per psych. Wants to get into drug rehab program, will discuss w/ CM   Alcohol abuse: drinks approx 6 pack of beer every other day. Continue on CIWA protocol    HTN: continue to hold all anti-HTN meds as BP is low normal    Bipolar disorder: no longer taking lithium     AKI on CKDIIIa: likely prerenal secondary to GI loss. Cr is trending down from day prior   Normocytic anemia: H&H are labile. No need for a transfusion currently      DVT prophylaxis: heparin  Code Status: full  Family Communication:  Disposition Plan: unclear. Pt is homeless and will likely have to be d/c to a shelter   Level of care: Telemetry Medical  Status is: Observation The patient remains OBS appropriate and will d/c before 2 midnights.    Consultants:  Pysch   Procedures:  Antimicrobials:    Subjective: Pt c/o diarrhea   Objective: Vitals:   07/12/21 1252 07/12/21 1558 07/12/21 1934 07/13/21 0510  BP: 98/64 121/74 127/75 113/81  Pulse: 85 86 94 77  Resp:  16 18 20   Temp: 98.2 F (36.8 C) 98.2 F (36.8 C) 97.7 F (36.5 C) 98 F (36.7 C)  TempSrc:  Oral Oral Oral Oral  SpO2: 96% 95% 96% 98%  Weight:      Height:        Intake/Output Summary (Last 24 hours) at 07/13/2021 0743 Last data filed at 07/13/2021 0327 Gross per 24 hour  Intake 1436.58 ml  Output --  Net 1436.58 ml   Filed Weights   07/11/21 1024  Weight: 77.1 kg    Examination:  General exam: Appears calm but uncomfortable  Respiratory system: Clear breath sounds b/l  Cardiovascular system: S1/S2+. No rubs or clicks   Gastrointestinal system: Abd is soft, NT, ND & hyperactive bowel sounds Central nervous system: Alert and oriented. Moves all extremities   Psychiatry: Judgement and insight appears at baseline. Flat mood and affect     Data Reviewed: I have personally reviewed following labs and imaging studies  CBC: Recent Labs  Lab 07/11/21 1034 07/12/21 0335 07/13/21 0430  WBC 10.4 5.5 4.0  NEUTROABS 6.6  --   --   HGB 13.4 10.3* 9.6*  HCT 40.6 31.3* 29.3*  MCV 87.9 88.9 89.3  PLT 313 220 197   Basic Metabolic Panel: Recent Labs  Lab 07/11/21 1034 07/12/21 0335 07/13/21 0430  NA 137 141 140  K 3.7 3.8 3.7  CL 101 113* 115*  CO2 21* 21* 22  GLUCOSE 106* 90 88  BUN 34* 21* 13  CREATININE 3.44* 1.38* 1.17  CALCIUM 10.0 8.7* 8.7*   GFR: Estimated Creatinine Clearance: 68.8 mL/min (by C-G formula based on SCr of 1.17 mg/dL). Liver Function Tests: Recent Labs  Lab 07/11/21 1034 07/13/21 0430  AST 84* 33  ALT 60* 40  ALKPHOS 83 57  BILITOT 0.9 0.6  PROT 8.0 5.8*  ALBUMIN 4.3 2.8*   Recent Labs  Lab 07/11/21 1034  LIPASE 33   No results for input(s): "AMMONIA" in the last 168 hours. Coagulation Profile: No results for input(s): "INR", "PROTIME" in the last 168 hours. Cardiac Enzymes: No results for input(s): "CKTOTAL", "CKMB", "CKMBINDEX", "TROPONINI" in the last 168 hours. BNP (last 3 results) No results for input(s): "PROBNP" in the last 8760 hours. HbA1C: No results for input(s): "HGBA1C" in the last 72 hours. CBG: No results for input(s): "GLUCAP" in the last 168 hours. Lipid Profile: No results for input(s): "CHOL", "HDL", "LDLCALC", "TRIG", "CHOLHDL", "LDLDIRECT" in the last 72 hours. Thyroid Function Tests: No results for input(s): "TSH", "T4TOTAL", "FREET4", "T3FREE", "THYROIDAB" in the last 72 hours. Anemia Panel: No results for input(s): "VITAMINB12", "FOLATE", "FERRITIN", "TIBC", "IRON", "RETICCTPCT" in the last 72 hours. Sepsis Labs: No results for input(s): "PROCALCITON", "LATICACIDVEN" in the last 168 hours.  Recent Results (from the past 240 hour(s))  SARS Coronavirus 2 by RT PCR  (hospital order, performed in [facility] hospital lab) *cepheid single result test* Anterior Nasal Swab     Status: None   Collection Time: 07/11/21  2:48 PM   Specimen: Anterior Nasal Swab  Result Value Ref Range Status   SARS Coronavirus 2 by RT PCR NEGATIVE NEGATIVE Final    Comment: (NOTE) SARS-CoV-2 target nucleic acids are NOT DETECTED.  The SARS-CoV-2 RNA is generally detectable in upper and lower respiratory specimens during the acute phase of infection. The lowest concentration of SARS-CoV-2 viral copies this assay can detect is 250 copies / mL. A negative result does not preclude SARS-CoV-2 infection and should not be used as the sole basis for treatment or other patient management decisions.  A negative result may occur with improper specimen collection / handling, submission of specimen  other than nasopharyngeal swab, presence of viral mutation(s) within the areas targeted by this assay, and inadequate number of viral copies (<250 copies / mL). A negative result must be combined with clinical observations, patient history, and epidemiological information.  Fact Sheet for Patients:   RoadLapTop.co.za  Fact Sheet for Healthcare Providers: http://kim-miller.com/  This test is not yet approved or  cleared by the [place] FDA and has been authorized for detection and/or diagnosis of SARS-CoV-2 by FDA under an Emergency Use Authorization (EUA).  This EUA will remain in effect (meaning this test can be used) for the duration of the COVID-19 declaration under Section 564(b)(1) of the Act, 21 U.S.C. section 360bbb-3(b)(1), unless the authorization is terminated or revoked sooner.  Performed at [facility], [address] [place], [place] [address]   C Difficile Quick Screen w PCR reflex     Status: None   Collection Time: 07/12/21  8:00 AM  Result Value Ref Range Status   C Diff antigen NEGATIVE NEGATIVE Final    C Diff toxin NEGATIVE NEGATIVE Final   C Diff interpretation No C. difficile detected.  Final    Comment: Performed at [facility], [address] [place], [place] [address]  Gastrointestinal Panel by PCR , Stool     Status: None   Collection Time: 07/12/21  8:20 AM   Specimen: Stool  Result Value Ref Range Status   Campylobacter species NOT DETECTED NOT DETECTED Final   Plesimonas shigelloides NOT DETECTED NOT DETECTED Final   Salmonella species NOT DETECTED NOT DETECTED Final   Yersinia enterocolitica NOT DETECTED NOT DETECTED Final   Vibrio species NOT DETECTED NOT DETECTED Final   Vibrio cholerae NOT DETECTED NOT DETECTED Final   Enteroaggregative E coli (EAEC) NOT DETECTED NOT DETECTED Final   Enteropathogenic E coli (EPEC) NOT DETECTED NOT DETECTED Final   Enterotoxigenic E coli (ETEC) NOT DETECTED NOT DETECTED Final   Shiga like toxin producing E coli (STEC) NOT DETECTED NOT DETECTED Final   Shigella/Enteroinvasive E coli (EIEC) NOT DETECTED NOT DETECTED Final   Cryptosporidium NOT DETECTED NOT DETECTED Final   Cyclospora cayetanensis NOT DETECTED NOT DETECTED Final   Entamoeba histolytica NOT DETECTED NOT DETECTED Final   Giardia lamblia NOT DETECTED NOT DETECTED Final   Adenovirus F40/41 NOT DETECTED NOT DETECTED Final   Astrovirus NOT DETECTED NOT DETECTED Final   Norovirus GI/GII NOT DETECTED NOT DETECTED Final   Rotavirus A NOT DETECTED NOT DETECTED Final   Sapovirus (I, II, IV, and V) NOT DETECTED NOT DETECTED Final    Comment: Performed at [facility], [address], [place], [place] [address]         Radiology Studies: CT ABDOMEN PELVIS WO CONTRAST  Result Date: 07/11/2021 CLINICAL DATA:  Nausea, vomiting, RIGHT lower quadrant pain, severe abdominal pain 1 and diaphoresis, recent history of appendicitis with surgery EXAM: CT ABDOMEN AND PELVIS WITHOUT CONTRAST TECHNIQUE: Multidetector CT imaging of the abdomen and pelvis was performed  following the standard protocol without IV contrast. RADIATION DOSE REDUCTION: This exam was performed according to the departmental dose-optimization program which includes automated exposure control, adjustment of the mA and/or kV according to patient size and/or use of iterative reconstruction technique. COMPARISON:  06/13/2021 FINDINGS: Lower chest: Minimal dependent density at the posterior lower lobes. Lung bases otherwise clear. Hepatobiliary: Gallbladder and liver normal appearance Pancreas: Normal appearance Spleen: Normal appearance Adrenals/Urinary Tract: Tiny BILATERAL nonobstructing renal calculi. Adrenal glands, kidneys, ureters, and bladder otherwise normal appearance. Stomach/Bowel:  Appendix surgically absent. Stomach and bowel loops normal appearance Vascular/Lymphatic: Atherosclerotic calcifications aorta and iliac arteries without aneurysm. No adenopathy. Reproductive: Unremarkable prostate gland and seminal vesicles Other: No free air or free fluid. No hernia or inflammatory process. Minimal postsurgical changes of the soft tissues at the umbilicus. Musculoskeletal: Osseous structures unremarkable. IMPRESSION: Post appendectomy. No acute intra-abdominal or intrapelvic abnormalities. Aortic Atherosclerosis (ICD10-I70.0). Electronically Signed   By: Ulyses Southward M.D.   On: 07/11/2021 12:10        Scheduled Meds:  doxepin  100 mg Oral QHS   folic acid  1 mg Oral Daily   gabapentin  400 mg Oral TID   heparin  5,000 Units Subcutaneous Q8H    morphine injection  4 mg Intravenous Once   multivitamin with minerals  1 tablet Oral Daily   thiamine  100 mg Oral Daily   Continuous Infusions:  sodium chloride 75 mL/hr at 07/13/21 0327   promethazine (PHENERGAN) injection (IM or IVPB)       LOS: 0 days    Time spent: 25 mins    Charise Killian, MD [facility] Pager [phone]  If 7PM-7AM, please contact night-coverage [email or web address] 07/13/2021, 7:43 AM

## 2021-07-14 DIAGNOSIS — F1490 Cocaine use, unspecified, uncomplicated: Secondary | ICD-10-CM | POA: Diagnosis not present

## 2021-07-14 LAB — COMPREHENSIVE METABOLIC PANEL
ALT: 31 U/L (ref 0–44)
AST: 21 U/L (ref 15–41)
Albumin: 3 g/dL — ABNORMAL LOW (ref 3.5–5.0)
Alkaline Phosphatase: 58 U/L (ref 38–126)
Anion gap: 5 (ref 5–15)
BUN: 15 mg/dL (ref 6–20)
CO2: 21 mmol/L — ABNORMAL LOW (ref 22–32)
Calcium: 8.9 mg/dL (ref 8.9–10.3)
Chloride: 115 mmol/L — ABNORMAL HIGH (ref 98–111)
Creatinine, Ser: 1.06 mg/dL (ref 0.61–1.24)
GFR, Estimated: >60 mL/min (ref >60)
Glucose, Bld: 107 mg/dL — ABNORMAL HIGH (ref 70–99)
Potassium: 3.7 mmol/L (ref 3.5–5.1)
Sodium: 141 mmol/L (ref 135–145)
Total Bilirubin: 0.5 mg/dL (ref 0.3–1.2)
Total Protein: 6 g/dL — ABNORMAL LOW (ref 6.5–8.1)

## 2021-07-14 LAB — CBC
HCT: 31 % — ABNORMAL LOW (ref 39.0–52.0)
Hemoglobin: 10.1 g/dL — ABNORMAL LOW (ref 13.0–17.0)
MCH: 28.5 pg (ref 26.0–34.0)
MCHC: 32.6 g/dL (ref 30.0–36.0)
MCV: 87.6 fL (ref 80.0–100.0)
Platelets: 222 10*3/uL (ref 150–400)
RBC: 3.54 MIL/uL — ABNORMAL LOW (ref 4.22–5.81)
RDW: 14.6 % (ref 11.5–15.5)
WBC: 4.9 10*3/uL (ref 4.0–10.5)
nRBC: 0 % (ref 0.0–0.2)

## 2021-07-14 MED ORDER — LOPERAMIDE HCL 2 MG PO CAPS: 2.0000 mg | ORAL_CAPSULE | Freq: Two times a day (BID) | ORAL | Status: DC

## 2021-07-14 MED ORDER — LOPERAMIDE HCL 2 MG PO CAPS: 2.0000 mg | ORAL_CAPSULE | Freq: Two times a day (BID) | ORAL | Status: DC | PRN

## 2021-07-14 MED ORDER — LOPERAMIDE HCL 2 MG PO CAPS
2.0000 mg | ORAL_CAPSULE | Freq: Two times a day (BID) | ORAL | Status: DC
  Administered 2021-07-14 – 2021-07-15 (×2): 2 mg via ORAL
  Filled 2021-07-14 (×2): qty 1

## 2021-07-14 NOTE — Progress Notes (Signed)
PROGRESS NOTE  [name]  DOB: 06-03-1960  PCP: [facility] PJK:[id number]  DOA: 07/11/2021  LOS: 1 day  Hospital Day: 4  Brief narrative: [name] is a 61 y.o. male with PMH significant for HTN, neuropathy, restless legs, seizures, cocaine use, nephrolithiasis, hep C, GERD, bipolar disorder, anxiety/depression, arthritis Patient presents to the ED on 7/2 with concern of nausea, vomiting and diarrhea  In the ED, his blood pressure was low in 80s, improved with IV fluids. Labs showed low potassium at 3.7, serum creatinine elevated to 3.44  Subjective: Patient was seen and examined this morning.  Middle-aged Caucasian male.  Not in distress.  Continues to have diarrhea. Chart reviewed Blood pressure in 130s this morning Creatinine improved to 1.06  Assessment and plan: Acute gastroenteritis -Presented with 2 days of nausea, vomiting, diarrhea -Negative GI pathogen panel and C. difficile assay. -COVID PCR negative. -Monitor, desiccating frequency of diarrhea.  Continue IV hydration.  Start on Imodium as needed.  AKI on CKD 3a -It seems patient's creatinine was less than 1 to May 2023.  He had an AKI at that time with creatinine rise up to 7.  Gradually improved to the lowest of 1.5 on 6/18.   -Presented this time with creatinine elevated to 3.44.  Gradually improving with IV fluids Recent Labs    06/15/21 0450 06/16/21 0458 06/17/21 0457 06/18/21 0501 06/25/21 2015 06/27/21 2009 07/11/21 1034 07/12/21 0335 07/13/21 0430 07/14/21 0345  BUN 63* 46* 35* 30* 57* 29* 34* 21* 13 15  CREATININE 4.15* 3.06* 2.18* 1.94* 2.34* 1.50* 3.44* 1.38* 1.17 1.06   Essential hypertension -Blood pressure in 130s this morning. -PTA on enalapril 10 mg daily.  Currently on hold  Chronic alcoholism -Patient drinks a sixpack of beer every other day. -Counseled to quit. -Continue CIWA protocol.  Major depressive disorder Suicidal ideation -Seen by  psychiatry.  Patient has chronic passive suicidal ideations as per psych. Does not meet inpatient psych criteria as per psych.  -Patient wants to get into drug rehab program, TOC consulted   Bipolar disorder -no longer taking lithium     Mild chronic anemia  -Baseline hemoglobin more than 10. Recent Labs    06/27/21 2009 07/11/21 1034 07/12/21 0335 07/13/21 0430 07/14/21 0345  HGB 11.8* 13.4 10.3* 9.6* 10.1*  MCV 88.3 87.9 88.9 89.3 87.6   Goals of care   Code Status: Full Code    Mobility: Encourage ambulation  Skin assessment:     Nutritional status:  Body mass index is 25.1 kg/m.          Diet:  Diet Order             Diet Heart Room service appropriate? Yes; Fluid consistency: Thin  Diet effective now                   DVT prophylaxis:  heparin injection 5,000 Units Start: 07/11/21 1400 Place TED hose Start: 07/11/21 1240   Antimicrobials: None Fluid: NS at 75 mill per hour Consultants: None Family Communication: None at bedside  Status is: Inpatient  Continue in-hospital care because: Continues to have diarrhea Level of care: Telemetry Medical   Dispo: The patient is from: Homeless shelter              Anticipated d/c is to: Homeless shelter              Patient currently is not medically stable to d/c.   Difficult to place patient  No     Infusions:   sodium chloride 75 mL/hr at 07/14/21 0845   promethazine (PHENERGAN) injection (IM or IVPB)      Scheduled Meds:  doxepin  100 mg Oral QHS   folic acid  1 mg Oral Daily   gabapentin  400 mg Oral TID   heparin  5,000 Units Subcutaneous Q8H   loperamide  2 mg Oral BID    morphine injection  4 mg Intravenous Once   multivitamin with minerals  1 tablet Oral Daily   thiamine  100 mg Oral Daily    PRN meds: acetaminophen **OR** acetaminophen, hydrALAZINE, LORazepam **OR** LORazepam, ondansetron **OR** ondansetron (ZOFRAN) IV, promethazine (PHENERGAN) injection (IM or IVPB), traZODone    Antimicrobials: Anti-infectives (From admission, onward)    None       Objective: Vitals:   07/14/21 0507 07/14/21 0811  BP: 132/86 (!) 139/96  Pulse: 76 79  Resp: 16 20  Temp: 97.8 F (36.6 C) 98.2 F (36.8 C)  SpO2: 98% 96%    Intake/Output Summary (Last 24 hours) at 07/14/2021 1544 Last data filed at 07/14/2021 0327 Gross per 24 hour  Intake 1792.6 ml  Output --  Net 1792.6 ml   Filed Weights   07/11/21 1024  Weight: 77.1 kg   Weight change:  Body mass index is 25.1 kg/m.   Physical Exam: General exam: Pleasant, middle-aged Caucasian male.  Not in physical distress Skin: No rashes, lesions or ulcers. HEENT: Atraumatic, normocephalic, no obvious bleeding Lungs: Clear to auscultation bilaterally CVS: Regular rate and rhythm, no murmur GI/Abd soft, nontender, nondistended, bowel sound present CNS: Alert, awake, oriented x3 Psychiatry: Mood appropriate Extremities: No pedal edema, no calf tenderness  Data Review: I have personally reviewed the laboratory data and studies available.  F/u labs ordered Unresulted Labs (From admission, onward)     Start     Ordered   Unscheduled  CBC with Differential/Platelet  Tomorrow morning,   R       Question:  Specimen collection method  Answer:  Lab=Lab collect   07/14/21 1544   Unscheduled  Basic metabolic panel  Tomorrow morning,   R       Question:  Specimen collection method  Answer:  Lab=Lab collect   07/14/21 1544            Signed, Lorin Glass, MD [facility] 07/14/2021

## 2021-07-15 DIAGNOSIS — F1490 Cocaine use, unspecified, uncomplicated: Secondary | ICD-10-CM | POA: Diagnosis not present

## 2021-07-15 LAB — BASIC METABOLIC PANEL
Anion gap: 5 (ref 5–15)
BUN: 15 mg/dL (ref 6–20)
CO2: 22 mmol/L (ref 22–32)
Calcium: 9 mg/dL (ref 8.9–10.3)
Chloride: 112 mmol/L — ABNORMAL HIGH (ref 98–111)
Creatinine, Ser: 0.94 mg/dL (ref 0.61–1.24)
GFR, Estimated: >60 mL/min (ref >60)
Glucose, Bld: 100 mg/dL — ABNORMAL HIGH (ref 70–99)
Potassium: 3.6 mmol/L (ref 3.5–5.1)
Sodium: 139 mmol/L (ref 135–145)

## 2021-07-15 LAB — CBC WITH DIFFERENTIAL/PLATELET
Abs Immature Granulocytes: 0.02 10*3/uL (ref 0.00–0.07)
Basophils Absolute: 0 10*3/uL (ref 0.0–0.1)
Basophils Relative: 1 %
Eosinophils Absolute: 0.1 10*3/uL (ref 0.0–0.5)
Eosinophils Relative: 2 %
HCT: 34 % — ABNORMAL LOW (ref 39.0–52.0)
Hemoglobin: 11.1 g/dL — ABNORMAL LOW (ref 13.0–17.0)
Immature Granulocytes: 0 %
Lymphocytes Relative: 31 %
Lymphs Abs: 1.6 10*3/uL (ref 0.7–4.0)
MCH: 28.2 pg (ref 26.0–34.0)
MCHC: 32.6 g/dL (ref 30.0–36.0)
MCV: 86.5 fL (ref 80.0–100.0)
Monocytes Absolute: 0.3 10*3/uL (ref 0.1–1.0)
Monocytes Relative: 6 %
Neutro Abs: 3 10*3/uL (ref 1.7–7.7)
Neutrophils Relative %: 60 %
Platelets: 235 10*3/uL (ref 150–400)
RBC: 3.93 MIL/uL — ABNORMAL LOW (ref 4.22–5.81)
RDW: 14.5 % (ref 11.5–15.5)
WBC: 4.9 10*3/uL (ref 4.0–10.5)
nRBC: 0 % (ref 0.0–0.2)

## 2021-07-15 MED ORDER — LOPERAMIDE HCL 2 MG PO CAPS: 2.0000 mg | ORAL_CAPSULE | ORAL | 0 refills | Status: AC | PRN

## 2021-07-15 NOTE — Discharge Summary (Signed)
Physician Discharge Summary  [name] [name] [name] ZOX:[id number] DOB: December 14, 1960 DOA: 07/11/2021  PCP: [facility]  Admit date: 07/11/2021 Discharge date: 07/15/2021  Admitted From: Homeless shelter Discharge disposition: Homeless shelter  Recommendations at discharge:  Substance abuse   Brief narrative: [name] [name] [name] is a 61 y.o. male with PMH significant for [name] [name], [name] [name] [name], [name] [name] [name], [name] [name] [name], [name] [name] [name], [name] [name] [name], [name] [name] [name], [name] [name] [name], [name] [name] [name], [name] [name] [name], [name] [name] [name] Patient presents to the ED on 7/2 with concern of nausea, vomiting and diarrhea  In the ED, his blood pressure was low in 80s, improved with IV fluids. Labs showed low potassium at 3.7, serum creatinine elevated to 3.44  Subjective: Patient was seen and examined this morning.  Patient states he still having diarrhea and had multiple episode last night.  However we were able to confirm from nursing staff that he did not have any diarrhea last night.  His labs are stable.  He does not have any significant findings on examination.  I think patient exaggerating his symptoms for secondary gain understandably because of homelessness. Stable for discharge today.  Assessment and plan: Acute gastroenteritis -Presented with 2 days of nausea, vomiting, diarrhea -Negative GI pathogen panel and [name] [name]. difficile assay. -COVID PCR negative. -Diarrhea frequency, severity improved with Imodium as needed.  Continue Imodium as needed at home.  AKI on CKD 3a -It seems patient's creatinine was less than 1 to May 2023.  He had an AKI at that time with creatinine rise up to 7.  Gradually improved to the lowest of 1.5 on 6/18.   -Presented this time with creatinine elevated to 3.44.  Gradually improved with IV fluids Recent Labs    06/16/21 0458 06/17/21 0457 06/18/21 0501 06/25/21 2015 06/27/21 2009 07/11/21 1034 07/12/21 0335 07/13/21 0430 07/14/21 0345 07/15/21 0440  BUN 46* 35* 30* 57* 29*  34* 21* 13 15 15   CREATININE 3.06* 2.18* 1.94* 2.34* 1.50* 3.44* 1.38* 1.17 1.06 0.94   Essential hypertension -Blood pressure in 120s this morning.  Patient was previously on enalapril which was held few months ago because of AKI.  I will continue to hold it for now.  Chronic alcoholism -Patient drinks a sixpack of beer every other day. -Counseled to quit. -No withdrawal symptoms in the hospital.  Major depressive [name] [name] Suicidal ideation -Seen by psychiatry.  Patient has chronic passive suicidal ideations as per psych. Does not meet inpatient psych criteria as per psych.  -Patient wants to get into drug rehab program, TOC consulted   [name] [name] [name] -no longer taking lithium     Mild chronic anemia  -Baseline hemoglobin more than 10. Recent Labs    07/11/21 1034 07/12/21 0335 07/13/21 0430 07/14/21 0345 07/15/21 0440  HGB 13.4 10.3* 9.6* 10.1* 11.1*  MCV 87.9 88.9 89.3 87.6 86.5   Wounds:  -    Discharge Exam:   Vitals:   07/14/21 1617 07/14/21 2005 07/15/21 0234 07/15/21 0759  BP: (!) 149/101 (!) 142/97 134/60 (!) 125/95  Pulse: 90 75 79 64  Resp: 20 16 16 18   Temp: 98.1 F (36.7 [name] [name]) (!) 97.4 F (36.3 [name] [name]) 98.3 F (36.8 [name] [name]) 97.6 F (36.4 [name] [name])  TempSrc: Oral   Oral  SpO2: 96% 97% 96% 96%  Weight:      Height:        Body mass index is 25.1 kg/m.  General exam: Pleasant, middle-aged Caucasian male.  Not in physical distress Skin: No rashes, lesions or ulcers. HEENT: Atraumatic, normocephalic, no obvious bleeding Lungs: Clear to  auscultation bilaterally CVS: Regular rate and rhythm, no murmur GI/Abd soft, nontender, nondistended, bowel sound present CNS: Alert, awake, oriented x3 Psychiatry: Mood appropriate Extremities: No pedal edema, no calf tenderness  Follow ups:    Follow-up Information     [facility] Follow up.   Specialty: General Practice Contact information: [address] [place] [address] [phone]                  Discharge Instructions:   Discharge Instructions     Call MD for:  difficulty breathing, headache or visual disturbances   Complete by: As directed    Call MD for:  extreme fatigue   Complete by: As directed    Call MD for:  hives   Complete by: As directed    Call MD for:  persistant dizziness or light-headedness   Complete by: As directed    Call MD for:  persistant nausea and vomiting   Complete by: As directed    Call MD for:  severe uncontrolled pain   Complete by: As directed    Call MD for:  temperature >100.4   Complete by: As directed    Diet general   Complete by: As directed    Discharge instructions   Complete by: As directed    Recommendations at discharge:   Substance abuse  General discharge instructions: Follow with Primary MD [facility] in 7 days  Please request your PCP  to go over your hospital tests, procedures, radiology results at the follow up. Please get your medicines reviewed and adjusted.  Your PCP may decide to repeat certain labs or tests as needed. Do not drive, operate heavy machinery, perform activities at heights, swimming or participation in water activities or provide baby sitting services if your were admitted for syncope or siezures until you have seen by Primary MD or a Neurologist and advised to do so again. [place] Controlled Substance Reporting System database was reviewed. Do not drive, operate heavy machinery, perform activities at heights, swim, participate in water activities or provide baby-sitting services while on medications for pain, sleep and mood until your outpatient physician has reevaluated you and advised to do so again.  You are strongly recommended to comply with the dose, frequency and duration of prescribed medications. Activity: As tolerated with Full fall precautions [name] [name] walker/cane & assistance as needed Avoid using any recreational substances like cigarette, tobacco,  alcohol, or non-prescribed drug. If you experience worsening of your admission symptoms, develop shortness of breath, life threatening emergency, suicidal or homicidal thoughts you must seek medical attention immediately by calling 911 or calling your MD immediately  if symptoms less severe. You must read complete instructions/literature along with all the possible adverse reactions/side effects for all the medicines you take and that have been prescribed to you. Take any new medicine only after you have completely understood and accepted all the possible adverse reactions/side effects.  Wear Seat belts while driving. You were cared for by a hospitalist during your hospital stay. If you have any questions about your discharge medications or the care you received while you were in the hospital after you are discharged, you can call the unit and ask to speak with the hospitalist or the covering physician. Once you are discharged, your primary care physician will handle any further medical issues. Please note that NO REFILLS for any discharge medications will be authorized once you are discharged, as it is imperative that you return to  your primary care physician (or establish a relationship with a primary care physician if you do not have one).   Increase activity slowly   Complete by: As directed        Discharge Medications:   Allergies as of 07/15/2021   No Known Allergies      Medication List     STOP taking these medications    enalapril 10 MG tablet Commonly known as: VASOTEC   FLUoxetine 20 MG capsule Commonly known as: PROZAC   folic acid 1 MG tablet Commonly known as: FOLVITE   lithium carbonate 300 MG CR tablet Commonly known as: LITHOBID   metoprolol succinate 50 MG 24 hr tablet Commonly known as: TOPROL-XL   multivitamin with minerals Tabs tablet   naltrexone 50 MG tablet Commonly known as: DEPADE   naproxen 500 MG tablet Commonly known as: Naprosyn   OLANZapine  10 MG tablet Commonly known as: ZYPREXA   OLANZapine 5 MG tablet Commonly known as: ZYPREXA   thiamine 100 MG tablet       TAKE these medications    doxepin 100 MG capsule Commonly known as: SINEQUAN Take 1 capsule (100 mg total) by mouth at bedtime.   gabapentin 400 MG capsule Commonly known as: NEURONTIN Take 1 capsule (400 mg total) by mouth 3 (three) times daily.   loperamide 2 MG capsule Commonly known as: IMODIUM Take 1 capsule (2 mg total) by mouth as needed for up to 5 days for diarrhea or loose stools.   traZODone 50 MG tablet Commonly known as: DESYREL Take 0.5 tablets (25 mg total) by mouth 3 (three) times daily as needed (Anxiety).         The results of significant diagnostics from this hospitalization (including imaging, microbiology, ancillary and laboratory) are listed below for reference.    Procedures and Diagnostic Studies:   CT ABDOMEN PELVIS WO CONTRAST  Result Date: 07/11/2021 CLINICAL DATA:  Nausea, vomiting, RIGHT lower quadrant pain, severe abdominal pain 1 and diaphoresis, recent history of appendicitis with surgery EXAM: CT ABDOMEN AND PELVIS WITHOUT CONTRAST TECHNIQUE: Multidetector CT imaging of the abdomen and pelvis was performed following the standard protocol without IV contrast. RADIATION DOSE REDUCTION: This exam was performed according to the departmental dose-optimization program which includes automated exposure control, adjustment of the mA and/or kV according to patient size and/or [name] [name] of iterative reconstruction technique. COMPARISON:  06/13/2021 FINDINGS: Lower chest: Minimal dependent density at the posterior lower lobes. Lung bases otherwise clear. Hepatobiliary: Gallbladder and liver normal appearance Pancreas: Normal appearance Spleen: Normal appearance Adrenals/Urinary Tract: Tiny BILATERAL nonobstructing renal calculi. Adrenal glands, kidneys, ureters, and bladder otherwise normal appearance. Stomach/Bowel: Appendix surgically  absent. Stomach and bowel loops normal appearance Vascular/Lymphatic: Atherosclerotic calcifications aorta and iliac arteries without aneurysm. No adenopathy. Reproductive: Unremarkable prostate gland and seminal vesicles Other: No free air or free fluid. No hernia or inflammatory process. Minimal postsurgical changes of the soft tissues at the umbilicus. Musculoskeletal: Osseous structures unremarkable. IMPRESSION: Post appendectomy. No acute intra-abdominal or intrapelvic abnormalities. Aortic Atherosclerosis (ICD10-I70.0). Electronically Signed   By: Ulyses Southward M.D.   On: 07/11/2021 12:10     Labs:   Basic Metabolic Panel: Recent Labs  Lab 07/11/21 1034 07/12/21 0335 07/13/21 0430 07/14/21 0345 07/15/21 0440  NA 137 141 140 141 139  K 3.7 3.8 3.7 3.7 3.6  CL 101 113* 115* 115* 112*  CO2 21* 21* 22 21* 22  GLUCOSE 106* 90 88 107* 100*  BUN 34* 21* 13 15  15  CREATININE 3.44* 1.38* 1.17 1.06 0.94  CALCIUM 10.0 8.7* 8.7* 8.9 9.0   GFR Estimated Creatinine Clearance: 85.7 mL/min (by [name] [name]-G formula based on SCr of 0.94 mg/dL). Liver Function Tests: Recent Labs  Lab 07/11/21 1034 07/13/21 0430 07/14/21 0345  AST 84* 33 21  ALT 60* 40 31  ALKPHOS 83 57 58  BILITOT 0.9 0.6 0.5  PROT 8.0 5.8* 6.0*  ALBUMIN 4.3 2.8* 3.0*   Recent Labs  Lab 07/11/21 1034  LIPASE 33   No results for input(s): "AMMONIA" in the last 168 hours. Coagulation profile No results for input(s): "INR", "PROTIME" in the last 168 hours.  CBC: Recent Labs  Lab 07/11/21 1034 07/12/21 0335 07/13/21 0430 07/14/21 0345 07/15/21 0440  WBC 10.4 5.5 4.0 4.9 4.9  NEUTROABS 6.6  --   --   --  3.0  HGB 13.4 10.3* 9.6* 10.1* 11.1*  HCT 40.6 31.3* 29.3* 31.0* 34.0*  MCV 87.9 88.9 89.3 87.6 86.5  PLT 313 220 197 222 235   Cardiac Enzymes: No results for input(s): "CKTOTAL", "CKMB", "CKMBINDEX", "TROPONINI" in the last 168 hours. BNP: Invalid input(s): "POCBNP" CBG: No results for input(s): "GLUCAP" in  the last 168 hours. D-Dimer No results for input(s): "DDIMER" in the last 72 hours. Hgb A1c No results for input(s): "HGBA1C" in the last 72 hours. Lipid Profile No results for input(s): "CHOL", "HDL", "LDLCALC", "TRIG", "CHOLHDL", "LDLDIRECT" in the last 72 hours. Thyroid function studies No results for input(s): "TSH", "T4TOTAL", "T3FREE", "THYROIDAB" in the last 72 hours.  Invalid input(s): "FREET3" Anemia work up No results for input(s): "VITAMINB12", "FOLATE", "FERRITIN", "TIBC", "IRON", "RETICCTPCT" in the last 72 hours. Microbiology Recent Results (from the past 240 hour(s))  SARS Coronavirus 2 by RT PCR (hospital order, performed in [facility] hospital lab) *cepheid single result test* Anterior Nasal Swab     Status: None   Collection Time: 07/11/21  2:48 PM   Specimen: Anterior Nasal Swab  Result Value Ref Range Status   SARS Coronavirus 2 by RT PCR NEGATIVE NEGATIVE Final    Comment: (NOTE) SARS-CoV-2 target nucleic acids are NOT DETECTED.  The SARS-CoV-2 RNA is generally detectable in upper and lower respiratory specimens during the acute phase of infection. The lowest concentration of SARS-CoV-2 viral copies this assay can detect is 250 copies / mL. A negative result does not preclude SARS-CoV-2 infection and should not be used as the sole basis for treatment or other patient management decisions.  A negative result may occur with improper specimen collection / handling, submission of specimen other than nasopharyngeal swab, presence of viral mutation(s) within the areas targeted by this assay, and inadequate number of viral copies (<250 copies / mL). A negative result must be combined with clinical observations, patient history, and epidemiological information.  Fact Sheet for Patients:   RoadLapTop.co.za  Fact Sheet for Healthcare Providers: http://kim-miller.com/  This test is not yet approved or  cleared by the  [place] FDA and has been authorized for detection and/or diagnosis of SARS-CoV-2 by FDA under an Emergency [name] [name] Authorization (EUA).  This EUA will remain in effect (meaning this test can be used) for the duration of the COVID-19 declaration under Section 564(b)(1) of the Act, 21 U.S.[name] [name]. section 360bbb-3(b)(1), unless the authorization is terminated or revoked sooner.  Performed at [facility], [address], [place], [place] [address]   [name] [name] Difficile Quick Screen w PCR reflex     Status: None   Collection Time: 07/12/21  8:00  AM  Result Value Ref Range Status   [name] [name] Diff antigen NEGATIVE NEGATIVE Final   [name] [name] Diff toxin NEGATIVE NEGATIVE Final   [name] [name] Diff interpretation No [name] [name]. difficile detected.  Final    Comment: Performed at [facility], [address] [place], [place] [address]  Gastrointestinal Panel by PCR , Stool     Status: None   Collection Time: 07/12/21  8:20 AM   Specimen: Stool  Result Value Ref Range Status   Campylobacter species NOT DETECTED NOT DETECTED Final   Plesimonas shigelloides NOT DETECTED NOT DETECTED Final   Salmonella species NOT DETECTED NOT DETECTED Final   Yersinia enterocolitica NOT DETECTED NOT DETECTED Final   Vibrio species NOT DETECTED NOT DETECTED Final   Vibrio cholerae NOT DETECTED NOT DETECTED Final   Enteroaggregative E coli (EAEC) NOT DETECTED NOT DETECTED Final   Enteropathogenic E coli (EPEC) NOT DETECTED NOT DETECTED Final   Enterotoxigenic E coli (ETEC) NOT DETECTED NOT DETECTED Final   Shiga like toxin producing E coli (STEC) NOT DETECTED NOT DETECTED Final   Shigella/Enteroinvasive E coli (EIEC) NOT DETECTED NOT DETECTED Final   Cryptosporidium NOT DETECTED NOT DETECTED Final   Cyclospora cayetanensis NOT DETECTED NOT DETECTED Final   Entamoeba histolytica NOT DETECTED NOT DETECTED Final   Giardia lamblia NOT DETECTED NOT DETECTED Final   Adenovirus F40/41 NOT DETECTED NOT DETECTED Final   Astrovirus NOT DETECTED  NOT DETECTED Final   Norovirus GI/GII NOT DETECTED NOT DETECTED Final   Rotavirus A NOT DETECTED NOT DETECTED Final   Sapovirus (I, II, IV, and V) NOT DETECTED NOT DETECTED Final    Comment: Performed at [facility], [address] [place], [place] [address]    Time coordinating discharge: 35 minutes  Signed: Aiya Keach  [facility] 07/15/2021, 12:08 PM

## 2021-07-15 NOTE — Progress Notes (Signed)
Pt discharged per MD order. IV removed. Discharge instructions reviewed with pt. Pt verbalized understanding. All questions answered to pt satisfaction. Pt refused a wheelchair and walked out.

## 2021-07-17 ENCOUNTER — Other Ambulatory Visit: Payer: Self-pay

## 2021-07-17 ENCOUNTER — Emergency Department
Admission: EM | Admit: 2021-07-17 | Discharge: 2021-07-18 | Disposition: A | Discharge status: Home / Self Care | Payer: Medicaid Other | Attending: Emergency Medicine | Admitting: Emergency Medicine

## 2021-07-17 ENCOUNTER — Encounter: Payer: Self-pay | Admitting: Emergency Medicine

## 2021-07-17 DIAGNOSIS — Z20822 Contact with and (suspected) exposure to covid-19: Secondary | ICD-10-CM | POA: Insufficient documentation

## 2021-07-17 DIAGNOSIS — F332 Major depressive disorder, recurrent severe without psychotic features: Secondary | ICD-10-CM

## 2021-07-17 DIAGNOSIS — S59912A Unspecified injury of left forearm, initial encounter: Secondary | ICD-10-CM | POA: Diagnosis present

## 2021-07-17 DIAGNOSIS — S51812A Laceration without foreign body of left forearm, initial encounter: Secondary | ICD-10-CM | POA: Diagnosis not present

## 2021-07-17 DIAGNOSIS — I10 Essential (primary) hypertension: Secondary | ICD-10-CM | POA: Insufficient documentation

## 2021-07-17 DIAGNOSIS — Z87891 Personal history of nicotine dependence: Secondary | ICD-10-CM | POA: Insufficient documentation

## 2021-07-17 DIAGNOSIS — Z96653 Presence of artificial knee joint, bilateral: Secondary | ICD-10-CM | POA: Insufficient documentation

## 2021-07-17 DIAGNOSIS — F338 Other recurrent depressive disorders: Secondary | ICD-10-CM | POA: Diagnosis not present

## 2021-07-17 DIAGNOSIS — R45851 Suicidal ideations: Secondary | ICD-10-CM | POA: Diagnosis not present

## 2021-07-17 DIAGNOSIS — X789XXA Intentional self-harm by unspecified sharp object, initial encounter: Secondary | ICD-10-CM | POA: Insufficient documentation

## 2021-07-17 LAB — RESP PANEL BY RT-PCR (FLU A&B, COVID) ARPGX2
Influenza A by PCR: NEGATIVE
Influenza B by PCR: NEGATIVE
SARS Coronavirus 2 by RT PCR: NEGATIVE

## 2021-07-17 NOTE — ED Provider Notes (Signed)
[facility] Provider Note    Event Date/Time   First MD Initiated Contact with Patient 07/17/21 1754     (approximate)   History   Chief Complaint: Suicidal   HPI  [name] is a 61 y.o. male with a history of PTSD, bipolar disorder, hypertension, GERD who comes ED complaining of suicidal thoughts.  Reports that he is normally on depression and anxiety medication but ran out several days ago.  In the setting, he has had worsening dealings of depression and feeling suicidal and is worried that he will harm himself.  He does report cutting his left forearm intentionally a week ago.  Tetanus is up-to-date within the last year or 2.  He denies any other complaints.  Eating normally.     Physical Exam   Triage Vital Signs: ED Triage Vitals  Enc Vitals Group     BP 07/17/21 1727 98/63     Pulse Rate 07/17/21 1727 62     Resp 07/17/21 1727 20     Temp 07/17/21 1727 97.8 F (36.6 C)     Temp Source 07/17/21 1727 Oral     SpO2 07/17/21 1727 100 %     Weight 07/17/21 1726 170 lb (77.1 kg)     Height 07/17/21 1726 5\' 9"  (1.753 m)     Head Circumference --      Peak Flow --      Pain Score 07/17/21 1726 0     Pain Loc --      Pain Edu? --      Excl. in GC? --     Most recent vital signs: Vitals:   07/17/21 1727  BP: 98/63  Pulse: 62  Resp: 20  Temp: 97.8 F (36.6 C)  SpO2: 100%    General: Awake, no distress.  CV:  Good peripheral perfusion.  Normal radial pulses Resp:  Normal effort.  Abd:  No distention.  Other:  Linear superficial laceration on left dorsal forearm, hemostatic, not gaping, healing, not inflamed or infected   ED Results / Procedures / Treatments   Labs (all labs ordered are listed, but only abnormal results are displayed) Labs Reviewed - No data to display   EKG    RADIOLOGY    PROCEDURES:  Procedures   MEDICATIONS ORDERED IN ED: Medications - No data to display   IMPRESSION / MDM / ASSESSMENT  AND PLAN / ED COURSE  I reviewed the triage vital signs and the nursing notes.                                Patient's presentation is most consistent with acute presentation with potential threat to life or bodily function.  Patient presents with symptoms of depression with suicidal thoughts and a plan.  He does report a history of suicide attempt in the past.  He is at elevated risk and I will IVC for safety pending psychiatry evaluation.  He has no acute medical complaints, vital signs and exam are reassuring, he is medically stable to proceed with psychiatry evaluation.  The patient has been placed in psychiatric observation due to the need to provide a safe environment for the patient while obtaining psychiatric consultation and evaluation, as well as ongoing medical and medication management to treat the patient's condition.  The patient has been placed under full IVC at this time.        FINAL CLINICAL IMPRESSION(S) /  ED DIAGNOSES   Final diagnoses:  Severe episode of recurrent major depressive disorder, without psychotic features (HCC)     Rx / DC Orders   ED Discharge Orders     None        Note:  This document was prepared using Dragon voice recognition software and may include unintentional dictation errors.   Sharman Cheek, MD 07/17/21 1924

## 2021-07-17 NOTE — ED Notes (Signed)
IVC/pending psych consult. Pt moved to [facility] 4

## 2021-07-17 NOTE — ED Triage Notes (Signed)
Pt in via [place] [facility] for suicidal ideation. Pt reports he has thought about hanging himself or cutting himself. Pt states "life is too hard".

## 2021-07-17 NOTE — Consult Note (Signed)
[facility] Face-to-Face Psychiatry Consult   Reason for Consult:  Psych evaluation Patient Identification: [name] MRN:  [id number] Principal Diagnosis: <principal problem not specified> Diagnosis:  Active Problems:   * No active hospital problems. *   Total Time spent with patient: 45 minutes  Subjective:   " I wanna hurt myself"  HPI:   [name], 61 y.o., male patient seen  by this provider; chart reviewed and consulted with Dr.Stafford on 07/18/21.  On evaluation [name] reports  per triage nurse, Pt in via [place] [employer] for suicidal ideation. Pt reports he has thought about hanging himself or cutting himself. Pt states "life is too hard".    Recommendation: Rescind IVC After thorough evaluation and review of information currently presented on assessment of [name] (respondent), there is insufficient findings to indicate respondent meets criteria for involuntary commitment or require an inpatient level of care.  There is no indication that the respondent is currently responding to internal/external stimuli or experiencing delusional thought content; and although respondent has endorsed suicidal/self-harm/homicidal ideation it is believed to be for secondary gain of admission.  Patient is currently homeless and presents to the er frequently.  In the last 6 months, patient has presented to the ED 10 and has had 6 admissions.  He has not followed up with discharge recommendations and continues to use cocaine and marijuana which further exacerbates his current condition. There is no evidence of, psychosis, and paranoia.   Respondent presents to er for secondary gain of admission as he is homeless due to chronic drug abuse.  Currently respondent is not significantly impaired, psychotic, or manic on exam.  A detailed risk assessment has been completed based on clinical exam and individual risk factors.  There is no evidence of imminent risk to self or others at present and  respondent does not meet criteria for psychiatric inpatient admission.  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 echo 5/17: EF 60-65%, no RWMA, LV dias fxn nl, LA mildly dilated, RV sys fxn nl, PASP nl, moderate sized circumferential pericardial effusion was identified, 2.12 cm around the LV free wall, <1 cm around the RV free wall. Features were not c/w tamponade physiology   PTSD (post-traumatic stress disorder)    Witnessed brother's suicide.   Restless leg syndrome    Seizures (HCC)    Syncope     Past Surgical History:  Procedure Laterality Date   CYSTOSCOPY WITH URETEROSCOPY AND STENT PLACEMENT     ESOPHAGOGASTRODUODENOSCOPY N/A 01/11/2016   Procedure: ESOPHAGOGASTRODUODENOSCOPY (EGD);  Surgeon: Charlott Rakes, MD;  Location: [facility] ENDOSCOPY;  Service: Endoscopy;  Laterality: N/A;   ESOPHAGOGASTRODUODENOSCOPY N/A 04/09/2020   Procedure: ESOPHAGOGASTRODUODENOSCOPY (EGD);  Surgeon: [name], MD;  Location: [facility] ENDOSCOPY;  Service: Gastroenterology;  Laterality: N/A;   INCISION AND DRAINAGE ABSCESS Left 01/02/2018   Procedure: INCISION AND DRAINAGE LEFT KNEE;  Surgeon:  Deeann Saint, MD;  Location: [facility] ORS;  Service: Orthopedics;  Laterality: Left;   JOINT REPLACEMENT Right    TKR   KNEE ARTHROSCOPY Right 06/25/2014   Procedure: ARTHROSCOPY KNEE;  Surgeon: Deeann Saint, MD;  Location: [facility] ORS;  Service: Orthopedics;  Laterality: Right;  partial arthroscopic medial menisectomy    LAPAROSCOPIC APPENDECTOMY N/A 06/02/2021   Procedure: APPENDECTOMY LAPAROSCOPIC;  Surgeon: Campbell Lerner, MD;  Location: [facility] ORS;  Service: General;  Laterality: N/A;   TOTAL KNEE ARTHROPLASTY Right 04/22/2015   Procedure: TOTAL KNEE ARTHROPLASTY;  Surgeon: Deeann Saint, MD;  Location: [facility] ORS;  Service: Orthopedics;  Laterality: Right;   TOTAL KNEE ARTHROPLASTY Left 10/30/2017   Procedure: TOTAL KNEE ARTHROPLASTY;  Surgeon: Deeann Saint, MD;  Location: [facility] ORS;  Service: Orthopedics;  Laterality: Left;   TOTAL KNEE REVISION Left 01/02/2018   Procedure: poly exchange of tibia and patella left knee;  Surgeon: Deeann Saint, MD;  Location: [facility] ORS;  Service: Orthopedics;  Laterality: Left;   UMBILICAL HERNIA REPAIR  06/02/2021   Procedure: HERNIA REPAIR UMBILICAL ADULT;  Surgeon: Campbell Lerner, MD;  Location: [facility] ORS;  Service: General;;   Family History:  Family History  Problem Relation Age of Onset   CVA Mother        deceased at age 66   Depression Brother        Died by suicide at age 14   Family Psychiatric  History: unknown Social History:  Social History   Substance and Sexual Activity  Alcohol Use Yes   Comment: rare     Social History   Substance and Sexual Activity  Drug Use Yes   Types: Marijuana, Cocaine    Social History   Socioeconomic History   Marital status: Single    Spouse name: Not on file   Number of children: Not on file   Years of education: Not on file   Highest education level: Not on file  Occupational History   Not on file  Tobacco Use   Smoking status: Former    Packs/day: 0.75    Years: 20.00    Total pack years: 15.00    Types: Cigarettes    Quit date: 05/16/1984    Years since quitting: 37.1   Smokeless tobacco: Never  Vaping Use   Vaping Use: Never used  Substance and Sexual Activity   Alcohol use: Yes    Comment: rare   Drug use: Yes    Types: Marijuana, Cocaine   Sexual activity: Not on file  Other Topics  Concern   Not on file  Social History Narrative   Not on file   Social Determinants of Health   Financial Resource Strain: Not on file  Food Insecurity: Not on file  Transportation Needs: Not on file  Physical Activity: Not on file  Stress: Not on file  Social Connections: Not on file   Additional Social History:    Allergies:  No Known Allergies  Labs:  Results for orders placed or performed during the hospital encounter of 07/17/21 (from the past 48 hour(s))  Resp Panel by RT-PCR (Flu A&B, Covid) Anterior Nasal Swab     Status: None   Collection Time: 07/17/21  7:30 PM   Specimen: Anterior Nasal Swab  Result Value Ref Range   SARS Coronavirus 2 by RT PCR NEGATIVE NEGATIVE    Comment: (NOTE) SARS-CoV-2 target nucleic acids are NOT DETECTED.  The SARS-CoV-2 RNA is generally detectable in upper respiratory specimens during the acute phase of  infection. The lowest concentration of SARS-CoV-2 viral copies this assay can detect is 138 copies/mL. A negative result does not preclude SARS-Cov-2 infection and should not be used as the sole basis for treatment or other patient management decisions. A negative result may occur with  improper specimen collection/handling, submission of specimen other than nasopharyngeal swab, presence of viral mutation(s) within the areas targeted by this assay, and inadequate number of viral copies(<138 copies/mL). A negative result must be combined with clinical observations, patient history, and epidemiological information. The expected result is Negative.  Fact Sheet for Patients:  BloggerCourse.com  Fact Sheet for Healthcare Providers:  SeriousBroker.it  This test is no t yet approved or cleared by the [place] FDA and  has been authorized for detection and/or diagnosis of SARS-CoV-2 by FDA under an Emergency Use Authorization (EUA). This EUA will remain  in effect (meaning this test  can be used) for the duration of the COVID-19 declaration under Section 564(b)(1) of the Act, 21 U.S.C.section 360bbb-3(b)(1), unless the authorization is terminated  or revoked sooner.       Influenza A by PCR NEGATIVE NEGATIVE   Influenza B by PCR NEGATIVE NEGATIVE    Comment: (NOTE) The Xpert Xpress SARS-CoV-2/FLU/RSV plus assay is intended as an aid in the diagnosis of influenza from Nasopharyngeal swab specimens and should not be used as a sole basis for treatment. Nasal washings and aspirates are unacceptable for Xpert Xpress SARS-CoV-2/FLU/RSV testing.  Fact Sheet for Patients: BloggerCourse.com  Fact Sheet for Healthcare Providers: SeriousBroker.it  This test is not yet approved or cleared by the [place] FDA and has been authorized for detection and/or diagnosis of SARS-CoV-2 by FDA under an Emergency Use Authorization (EUA). This EUA will remain in effect (meaning this test can be used) for the duration of the COVID-19 declaration under Section 564(b)(1) of the Act, 21 U.S.C. section 360bbb-3(b)(1), unless the authorization is terminated or revoked.  Performed at [facility], [address] [place], [place] [address]     No current facility-administered medications for this encounter.   Current Outpatient Medications  Medication Sig Dispense Refill   doxepin (SINEQUAN) 100 MG capsule Take 1 capsule (100 mg total) by mouth at bedtime. 30 capsule 3   gabapentin (NEURONTIN) 300 MG capsule Take 300 mg by mouth 3 (three) times daily.     haloperidol (HALDOL) 5 MG tablet Take 5 mg by mouth 2 (two) times daily.     traZODone (DESYREL) 50 MG tablet Take 0.5 tablets (25 mg total) by mouth 3 (three) times daily as needed (Anxiety). 90 tablet 3   gabapentin (NEURONTIN) 400 MG capsule Take 1 capsule (400 mg total) by mouth 3 (three) times daily. (Patient not taking: Reported on 07/17/2021) 90 capsule 3    loperamide (IMODIUM) 2 MG capsule Take 1 capsule (2 mg total) by mouth as needed for up to 5 days for diarrhea or loose stools. 10 capsule 0    Musculoskeletal: Strength & Muscle Tone: within normal limits Gait & Station: normal Patient leans: N/A            Psychiatric Specialty Exam:  Presentation  General Appearance: Appropriate for Environment  Eye Contact:Fair  Speech:Clear and Coherent  Speech Volume:Decreased  Handedness:Right   Mood and Affect  Mood:Euthymic  Affect:Congruent   Thought Process  Thought Processes:Coherent  Descriptions of Associations:Intact  Orientation:Full (Time, Place and Person)  Thought Content:Logical; WDL  History of Schizophrenia/Schizoaffective disorder:No  Duration of Psychotic Symptoms:N/A  Hallucinations:Hallucinations: None  Ideas  of Reference:None  Suicidal Thoughts:Suicidal Thoughts: Yes, Passive SI Active Intent and/or Plan: Without Plan SI Passive Intent and/or Plan: Without Access to Means  Homicidal Thoughts:Homicidal Thoughts: No   Sensorium  Memory:Immediate Fair; Remote Fair  Judgment:Fair  Insight:Fair   Executive Functions  Concentration:Fair  Attention Span:Fair  Recall:Fair  Fund of Knowledge:Fair  Language:Fair   Psychomotor Activity  Psychomotor Activity:Psychomotor Activity: Normal   Assets  Assets:Resilience; Physical Health   Sleep  Sleep:Sleep: Fair   Physical Exam: Physical Exam Vitals and nursing note reviewed.  Constitutional:      Appearance: Normal appearance.  HENT:     Head: Normocephalic and atraumatic.     Nose: Nose normal.  Eyes:     Pupils: Pupils are equal, round, and reactive to light.  Pulmonary:     Effort: Pulmonary effort is normal.     Breath sounds: No wheezing.  Musculoskeletal:        General: Normal range of motion.     Cervical back: Normal range of motion.  Skin:    General: Skin is dry.  Neurological:     Mental Status: He  is oriented to person, place, and time.  Psychiatric:        Attention and Perception: Attention normal.        Mood and Affect: Mood and affect normal.        Speech: Speech normal.        Behavior: Behavior is cooperative.        Thought Content: Thought content normal.        Cognition and Memory: Cognition and memory normal.        Judgment: Judgment is inappropriate.   Review of Systems  Psychiatric/Behavioral:  Positive for substance abuse. Negative for hallucinations.   All other systems reviewed and are negative.  Blood pressure 92/60, pulse 68, temperature 98 F (36.7 C), temperature source Oral, resp. rate 18, height 5\' 9"  (1.753 m), weight 77.1 kg, SpO2 99 %. Body mass index is 25.1 kg/m.   Disposition: No evidence of imminent risk to self or others at present.   Patient does not meet criteria for psychiatric inpatient admission. Refer to IOP. Discussed crisis plan, support from social network, calling 911, coming to the Emergency Department, and calling Suicide Hotline.  , NP 07/18/2021 12:15 AM

## 2021-07-17 NOTE — ED Notes (Signed)
Pt calm and cooperative.

## 2021-07-17 NOTE — ED Notes (Signed)
Snack and beverage given. 

## 2021-07-17 NOTE — ED Notes (Signed)
Pt. Transferred to BHU from ED to room 4 after screening for contraband. Report to include Situation, Background, Assessment and Recommendations from [facility]. Pt. Oriented to unit including Q15 minute rounds as well as the security cameras for their protection. Patient is alert and oriented, warm and dry in no acute distress. Patient reported SI. He contracted for safety. Denied HI, and AVH. Pt. Encouraged to let me know if needs arise.

## 2021-07-17 NOTE — ED Triage Notes (Addendum)
Pt belongings:  [name] shoes [name] socks [employer] Black pants Gray metal circular ring White underwear [employer] Black/ orange bag  Belongings bagged, tagged and handed off to primary [employer]

## 2021-07-18 NOTE — Consult Note (Addendum)
Client continues to be at this baseline with no threat to self or others, no withdrawal symptoms.  Court dates on Wednesday, 7/12, felony charges; prevents client from going to rehab.  Nanine Means, PMHNP

## 2021-07-18 NOTE — ED Notes (Signed)
IVC/Consult completed/Doesn't meet criteria for Inpt /Will refer OP

## 2021-07-18 NOTE — ED Notes (Signed)
Pt A/Ox3 left ambulatory via [place] [employer], All pt belongings returned and accounted for by pt.  Pt denied SI/HI stated no A/V hallucinations.  Pt only complaint is of homelessness and of pendintg legal issues.

## 2021-07-18 NOTE — ED Notes (Signed)
Report received from Hewan M, RN including SBAR. On initial round after report Pt is warm/dry, resting quietly in room without any s/s of distress.  Will continue to monitor throughout shift as ordered for any changes in behaviors and for continued safety.   

## 2022-02-28 DIAGNOSIS — Z0131 Encounter for examination of blood pressure with abnormal findings: Secondary | ICD-10-CM | POA: Diagnosis not present

## 2022-02-28 DIAGNOSIS — Z1331 Encounter for screening for depression: Secondary | ICD-10-CM | POA: Diagnosis not present

## 2022-02-28 DIAGNOSIS — F332 Major depressive disorder, recurrent severe without psychotic features: Secondary | ICD-10-CM | POA: Diagnosis not present

## 2022-02-28 DIAGNOSIS — Z1389 Encounter for screening for other disorder: Secondary | ICD-10-CM | POA: Diagnosis not present

## 2022-04-04 DIAGNOSIS — F319 Bipolar disorder, unspecified: Secondary | ICD-10-CM | POA: Diagnosis not present

## 2022-04-13 ENCOUNTER — Emergency Department
Admission: EM | Admit: 2022-04-13 | Discharge: 2022-04-14 | Disposition: A | Discharge status: Other Acute Inpatient Hospital | Payer: 59 | Attending: Emergency Medicine | Admitting: Emergency Medicine

## 2022-04-13 ENCOUNTER — Encounter: Payer: Self-pay | Admitting: Radiology

## 2022-04-13 ENCOUNTER — Emergency Department: Payer: 59

## 2022-04-13 ENCOUNTER — Other Ambulatory Visit: Payer: Self-pay

## 2022-04-13 DIAGNOSIS — I10 Essential (primary) hypertension: Secondary | ICD-10-CM | POA: Insufficient documentation

## 2022-04-13 DIAGNOSIS — F609 Personality disorder, unspecified: Secondary | ICD-10-CM | POA: Diagnosis present

## 2022-04-13 DIAGNOSIS — N281 Cyst of kidney, acquired: Secondary | ICD-10-CM | POA: Diagnosis not present

## 2022-04-13 DIAGNOSIS — R1031 Right lower quadrant pain: Secondary | ICD-10-CM | POA: Diagnosis not present

## 2022-04-13 DIAGNOSIS — N2889 Other specified disorders of kidney and ureter: Secondary | ICD-10-CM

## 2022-04-13 DIAGNOSIS — Z87891 Personal history of nicotine dependence: Secondary | ICD-10-CM | POA: Diagnosis not present

## 2022-04-13 DIAGNOSIS — F332 Major depressive disorder, recurrent severe without psychotic features: Secondary | ICD-10-CM | POA: Diagnosis not present

## 2022-04-13 DIAGNOSIS — M25551 Pain in right hip: Secondary | ICD-10-CM | POA: Diagnosis not present

## 2022-04-13 DIAGNOSIS — F6089 Other specific personality disorders: Secondary | ICD-10-CM | POA: Diagnosis not present

## 2022-04-13 DIAGNOSIS — Z1152 Encounter for screening for COVID-19: Secondary | ICD-10-CM | POA: Insufficient documentation

## 2022-04-13 DIAGNOSIS — F1490 Cocaine use, unspecified, uncomplicated: Secondary | ICD-10-CM | POA: Diagnosis not present

## 2022-04-13 DIAGNOSIS — Z96653 Presence of artificial knee joint, bilateral: Secondary | ICD-10-CM | POA: Diagnosis not present

## 2022-04-13 DIAGNOSIS — Z765 Malingerer [conscious simulation]: Secondary | ICD-10-CM | POA: Diagnosis not present

## 2022-04-13 DIAGNOSIS — M79604 Pain in right leg: Secondary | ICD-10-CM | POA: Diagnosis not present

## 2022-04-13 DIAGNOSIS — M25511 Pain in right shoulder: Secondary | ICD-10-CM | POA: Diagnosis not present

## 2022-04-13 DIAGNOSIS — N179 Acute kidney failure, unspecified: Secondary | ICD-10-CM | POA: Diagnosis not present

## 2022-04-13 DIAGNOSIS — F32A Depression, unspecified: Secondary | ICD-10-CM | POA: Diagnosis present

## 2022-04-13 LAB — URINE DRUG SCREEN, QUALITATIVE (ARMC ONLY)
Amphetamines, Ur Screen: NOT DETECTED
Barbiturates, Ur Screen: NOT DETECTED
Benzodiazepine, Ur Scrn: NOT DETECTED
Cannabinoid 50 Ng, Ur ~~LOC~~: POSITIVE — AB
Cocaine Metabolite,Ur ~~LOC~~: POSITIVE — AB
MDMA (Ecstasy)Ur Screen: NOT DETECTED
Methadone Scn, Ur: NOT DETECTED
Opiate, Ur Screen: POSITIVE — AB
Phencyclidine (PCP) Ur S: NOT DETECTED
Tricyclic, Ur Screen: NOT DETECTED

## 2022-04-13 LAB — COMPREHENSIVE METABOLIC PANEL
ALT: 21 U/L (ref 0–44)
AST: 33 U/L (ref 15–41)
Albumin: 4.1 g/dL (ref 3.5–5.0)
Alkaline Phosphatase: 57 U/L (ref 38–126)
Anion gap: 12 (ref 5–15)
BUN: 20 mg/dL (ref 6–20)
CO2: 20 mmol/L — ABNORMAL LOW (ref 22–32)
Calcium: 9.7 mg/dL (ref 8.9–10.3)
Chloride: 106 mmol/L (ref 98–111)
Creatinine, Ser: 1.13 mg/dL (ref 0.61–1.24)
GFR, Estimated: >60 mL/min (ref >60)
Glucose, Bld: 98 mg/dL (ref 70–99)
Potassium: 3.9 mmol/L (ref 3.5–5.1)
Sodium: 138 mmol/L (ref 135–145)
Total Bilirubin: 1.5 mg/dL — ABNORMAL HIGH (ref 0.3–1.2)
Total Protein: 7.8 g/dL (ref 6.5–8.1)

## 2022-04-13 LAB — CBC WITH DIFFERENTIAL/PLATELET
Abs Immature Granulocytes: 0.04 10*3/uL (ref 0.00–0.07)
Basophils Absolute: 0.1 10*3/uL (ref 0.0–0.1)
Basophils Relative: 1 %
Eosinophils Absolute: 0.1 10*3/uL (ref 0.0–0.5)
Eosinophils Relative: 1 %
HCT: 41 % (ref 39.0–52.0)
Hemoglobin: 13.8 g/dL (ref 13.0–17.0)
Immature Granulocytes: 1 %
Lymphocytes Relative: 30 %
Lymphs Abs: 2.6 10*3/uL (ref 0.7–4.0)
MCH: 29.7 pg (ref 26.0–34.0)
MCHC: 33.7 g/dL (ref 30.0–36.0)
MCV: 88.4 fL (ref 80.0–100.0)
Monocytes Absolute: 0.8 10*3/uL (ref 0.1–1.0)
Monocytes Relative: 10 %
Neutro Abs: 5.2 10*3/uL (ref 1.7–7.7)
Neutrophils Relative %: 57 %
Platelets: 209 10*3/uL (ref 150–400)
RBC: 4.64 MIL/uL (ref 4.22–5.81)
RDW: 14.9 % (ref 11.5–15.5)
WBC: 8.8 10*3/uL (ref 4.0–10.5)
nRBC: 0 % (ref 0.0–0.2)

## 2022-04-13 LAB — URINALYSIS, ROUTINE W REFLEX MICROSCOPIC
Bilirubin Urine: NEGATIVE
Glucose, UA: NEGATIVE mg/dL
Hgb urine dipstick: NEGATIVE
Ketones, ur: NEGATIVE mg/dL
Leukocytes,Ua: NEGATIVE
Nitrite: NEGATIVE
Protein, ur: NEGATIVE mg/dL
Specific Gravity, Urine: 1.028 (ref 1.005–1.030)
pH: 6 (ref 5.0–8.0)

## 2022-04-13 LAB — ACETAMINOPHEN LEVEL: Acetaminophen (Tylenol), Serum: <10 ug/mL — ABNORMAL LOW (ref 10–30)

## 2022-04-13 LAB — LIPASE, BLOOD: Lipase: 56 U/L — ABNORMAL HIGH (ref 11–51)

## 2022-04-13 LAB — SALICYLATE LEVEL: Salicylate Lvl: <7 mg/dL — ABNORMAL LOW (ref 7.0–30.0)

## 2022-04-13 LAB — ETHANOL: Alcohol, Ethyl (B): <10 mg/dL (ref <10)

## 2022-04-13 MED ORDER — IOHEXOL 300 MG/ML  SOLN
100.0000 mL | Freq: Once | INTRAMUSCULAR | Status: AC | PRN
  Administered 2022-04-13: 100 mL via INTRAVENOUS

## 2022-04-13 MED ORDER — ONDANSETRON HCL 4 MG/2ML IJ SOLN
4.0000 mg | Freq: Once | INTRAMUSCULAR | Status: AC
  Administered 2022-04-13: 4 mg via INTRAVENOUS
  Filled 2022-04-13: qty 2

## 2022-04-13 MED ORDER — MORPHINE SULFATE (PF) 4 MG/ML IV SOLN
4.0000 mg | Freq: Once | INTRAVENOUS | Status: AC
  Administered 2022-04-13: 4 mg via INTRAVENOUS
  Filled 2022-04-13: qty 1

## 2022-04-13 MED ORDER — LACTATED RINGERS IV BOLUS
1000.0000 mL | Freq: Once | INTRAVENOUS | Status: AC
  Administered 2022-04-13: 1000 mL via INTRAVENOUS

## 2022-04-13 NOTE — ED Triage Notes (Signed)
Pt to ED via [facility] from the side of the road. Pt reports was going for a walk and felt right hip pop. No deformities and is able to bear weight since.

## 2022-04-13 NOTE — ED Notes (Signed)
Patient transported to CT 

## 2022-04-13 NOTE — ED Notes (Signed)
Pt given PM snack.

## 2022-04-13 NOTE — ED Notes (Addendum)
Patient belongings:   - 2 multicolored shoes - 2 white socks - 1 green jacket - 1 pair of black pants - 1 phone charger - 1 cell phone  - 1 walllet (in pants pocket) - 1 chapstick - 1 black belt - 1 pair of glasses - 1 comb - 1 white tshirt

## 2022-04-13 NOTE — ED Provider Notes (Signed)
[facility] Provider Note    Event Date/Time   First MD Initiated Contact with Patient 04/13/22 1530     (approximate)   History   Chief Complaint Hip Pain   HPI  [name] is a 62 y.o. male with past medical history of hypertension, bipolar disorder, alcohol abuse, and cocaine use who presents to the ED complaining of hip pain.  Patient reports that he was walking just prior to arrival when he stepped down off a ledge and had immediate pain in his right hip and groin.  He states the pain moves up into the right lower quadrant of his abdomen, has been constant and severe since onset.  He has not noticed any swelling in the area, but he does report recent hematuria and dysuria with history of kidney stones.  He has not had any pain in his flank and denies any fevers, has not taken anything for pain prior to arrival.     Physical Exam   Triage Vital Signs: ED Triage Vitals  Enc Vitals Group     BP 04/13/22 1332 (!) 140/86     Pulse Rate 04/13/22 1332 86     Resp 04/13/22 1332 18     Temp 04/13/22 1332 98 F (36.7 C)     Temp Source 04/13/22 1332 Oral     SpO2 04/13/22 1332 96 %     Weight --      Height --      Head Circumference --      Peak Flow --      Pain Score 04/13/22 1327 7     Pain Loc --      Pain Edu? --      Excl. in [place]? --     Most recent vital signs: Vitals:   04/13/22 1332  BP: (!) 140/86  Pulse: 86  Resp: 18  Temp: 98 F (36.7 C)  SpO2: 96%    Constitutional: Alert and oriented. Eyes: Conjunctivae are normal. Head: Atraumatic. Nose: No congestion/rhinnorhea. Mouth/Throat: Mucous membranes are moist.  Cardiovascular: Normal rate, regular rhythm. Grossly normal heart sounds.  2+ radial pulses bilaterally. Respiratory: Normal respiratory effort.  No retractions. Lungs CTAB. Gastrointestinal: Soft and tender to palpation in the right lower quadrant with no rebound or guarding.  No CVA tenderness bilaterally.  No  distention. Genitourinary: No testicular tenderness noted bilaterally, no inguinal hernia noted. Musculoskeletal: No lower extremity tenderness nor edema.  Neurologic:  Normal speech and language. No gross focal neurologic deficits are appreciated.    ED Results / Procedures / Treatments   Labs (all labs ordered are listed, but only abnormal results are displayed) Labs Reviewed  COMPREHENSIVE METABOLIC PANEL - Abnormal; Notable for the following components:      Result Value   CO2 20 (*)    Total Bilirubin 1.5 (*)    All other components within normal limits  LIPASE, BLOOD - Abnormal; Notable for the following components:   Lipase 56 (*)    All other components within normal limits  CBC WITH DIFFERENTIAL/PLATELET  URINALYSIS, ROUTINE W REFLEX MICROSCOPIC  ACETAMINOPHEN LEVEL  SALICYLATE LEVEL  ETHANOL  URINE DRUG SCREEN, QUALITATIVE ([facility] ONLY)     RADIOLOGY Right hip x-ray reviewed and interpreted by me with no fracture or dislocation.  PROCEDURES:  Critical Care performed: No  Procedures   MEDICATIONS ORDERED IN ED: Medications  morphine (PF) 4 MG/ML injection 4 mg (4 mg Intravenous Given 04/13/22 1654)  lactated ringers bolus  1,000 mL (1,000 mLs Intravenous New Bag/Given 04/13/22 1651)  ondansetron (ZOFRAN) injection 4 mg (4 mg Intravenous Given 04/13/22 1655)  iohexol (OMNIPAQUE) 300 MG/ML solution 100 mL (100 mLs Intravenous Contrast Given 04/13/22 1738)     IMPRESSION / MDM / ASSESSMENT AND PLAN / ED COURSE  I reviewed the triage vital signs and the nursing notes.                              62 y.o. male with past medical history of hypertension, bipolar disorder, alcohol abuse, and cocaine abuse who presents to the ED with sudden onset right groin, hip, and right lower quadrant abdominal pain while walking associated with hematuria and dysuria.  Patient's presentation is most consistent with acute presentation with potential threat to life or bodily  function.  Differential diagnosis includes, but is not limited to, hip fracture, dislocation, inguinal hernia, testicular torsion, appendicitis, UTI, kidney stone.  Patient uncomfortable appearing but in no acute distress, vital signs are unremarkable.  Pain is primarily reproduced with palpation in the right lower quadrant of his abdomen, no testicular tenderness noted and no obvious inguinal hernia on exam.  Pain also reproduced by movement at his right hip and may be musculoskeletal, but will further assess with CT imaging for intra-abdominal pathology.  X-ray of the right hip is unremarkable, labs and urinalysis are pending at this time.  We will treat symptomatically with IV morphine and hydrate with IV fluids.  CT abdomen/pelvis shows 1.8 cm right renal mass but is otherwise negative for acute process.  Labs are reassuring with no significant anemia, leukocytosis, tract abnormality, or AKI.  LFTs and lipase are unremarkable, urinalysis is pending.  Patient now stating that he would like to speak to psychiatry due to increasing depression.  When asked if he is having thoughts of suicide, patient states "it has gone beyond that."  He currently denies any intent to harm himself but would benefit from psychiatric evaluation, will maintain voluntary status.  The patient has been placed in psychiatric observation due to the need to provide a safe environment for the patient while obtaining psychiatric consultation and evaluation, as well as ongoing medical and medication management to treat the patient's condition.  The patient has not been placed under full IVC at this time.      FINAL CLINICAL IMPRESSION(S) / ED DIAGNOSES   Final diagnoses:  Right lower quadrant abdominal pain  Right hip pain  Renal mass, right     Rx / DC Orders   ED Discharge Orders     None        Note:  This document was prepared using Dragon voice recognition software and may include unintentional dictation  errors.   Blake Divine, MD 04/13/22 [phone]

## 2022-04-13 NOTE — ED Notes (Addendum)
Pt up to nurses station requestin psychological help. This RN explaining that an EDP will assess pt and this RN will make charge RN aware of pt concerns. Pt denies SI or HI but states he just needs help.

## 2022-04-13 NOTE — ED Notes (Signed)
Pt undressed and placed in hospital issued scrubs. Pt's belongings placed in belongings bag. Pt taken to [facility] for psych eveal and report given to Clarita, [place]

## 2022-04-13 NOTE — Consult Note (Signed)
[place] [facility] Consult   Reason for Consult: Hip Pain   Referring Physician: Dr. Charna Archer Patient Identification: [name] MRN:  [id number] Principal Diagnosis: <principal problem not specified> Diagnosis:  Active Problems:   Cluster B personality disorder   Depression   Major depressive disorder, recurrent severe without psychotic features   Malingering   Cocaine use   Total Time spent with patient: 1 hour  Subjective: "I have been hearing these voices and I can't deal with it anymore."  [name] is a 62 y.o. male patient presented to  [facility] ED by [facility] from the side of the road after experiencing a pop in his right hip while going for a walk. Concurrently, he reports auditory hallucinations and expresses difficulty coping with them. He acknowledges a longstanding history of substance use, currently testing positive for cocaine and cannabinoid. Additionally, the patient has a history of malingering which may influence his presentation. He reports difficulty accessing psychiatric care and expresses a desire to resume medications. The patient admits to occasional suicidal ideation during severe episodes of auditory hallucinations. Longstanding substance use disorder, difficulty accessing psychiatric care.  The patient is alert and oriented x4, calm, cooperative.Mood/Affect: Mood-congruent with affect, not responding to internal or external stimuli. Neurological: No focal deficits noted. Psychiatric: Denies suicidal, homicidal, or self-harm ideations, but expresses occasional suicidal ideation during severe auditory hallucinations. No psychotic or paranoid behaviors observed. Able to answer questions appropriately. Assessment and Plan: There are further evaluation and management required. For the patient complaints of auditory hallucinations. Suggested psychiatric evaluation and initiation of appropriate medications. The patient history of substance use disorder has  been address with appropriate interventions, including counseling and potential referral to a substance abuse treatment program. The patient shared it is difficult accessing psychiatric care there is a plan that the patient's will be linked to psychiatric services and facilitate connection with psychiatric provider. Admission to psychiatric inpatient unit the patient meets criteria for admission based on auditory hallucinations and history of suicidal ideation during severe episodes. Consulted with Dr. Jari Pigg on 04/13/2022 for collaboration on patient care. Decision made to admit patient to psychiatric inpatient unit for further evaluation and management.  Disposition: Admit to psychiatric inpatient unit for comprehensive evaluation and management.   HPI: [name] is a 62 y.o. male with past medical history of hypertension, bipolar disorder, alcohol abuse, and cocaine use who presents to the ED complaining of hip pain.  Patient reports that he was walking just prior to arrival when he stepped down off a ledge and had immediate pain in his right hip and groin.  He states the pain moves up into the right lower quadrant of his abdomen, has been constant and severe since onset.  He has not noticed any swelling in the area, but he does report recent hematuria and dysuria with history of kidney stones.  He has not had any pain in his flank and denies any fevers, has not taken anything for pain prior to arrival.    Past Psychiatric History:  Anxiety Bipolar disorder Depression PTSD (post-traumatic stress disorder) Witnessed brother's suicide. 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 echo 5/17: EF 60-65%, no RWMA, LV dias fxn nl, LA mildly dilated, RV sys fxn nl, PASP nl, moderate sized circumferential pericardial effusion was identified, 2.12 cm around the LV free wall, <1 cm around the RV free wall. Features were not c/w tamponade physiology   PTSD (post-traumatic stress disorder)    Witnessed brother's suicide.   Restless leg syndrome    Seizures    Syncope     Past Surgical History:  Procedure Laterality Date   CYSTOSCOPY WITH URETEROSCOPY AND STENT PLACEMENT     ESOPHAGOGASTRODUODENOSCOPY N/A 01/11/2016   Procedure: ESOPHAGOGASTRODUODENOSCOPY (EGD);  Surgeon: Wilford Corner, MD;  Location: [facility] ENDOSCOPY;  Service: Endoscopy;  Laterality: N/A;   ESOPHAGOGASTRODUODENOSCOPY N/A 04/09/2020   Procedure: ESOPHAGOGASTRODUODENOSCOPY (EGD);  Surgeon: Jonathon Bellows, MD;  Location: [facility] ENDOSCOPY;  Service: Gastroenterology;  Laterality: N/A;   INCISION AND DRAINAGE ABSCESS Left 01/02/2018   Procedure: INCISION AND DRAINAGE LEFT KNEE;  Surgeon: Earnestine Leys, MD;  Location: [facility] ORS;  Service: Orthopedics;  Laterality: Left;   JOINT REPLACEMENT Right    TKR   KNEE ARTHROSCOPY Right 06/25/2014   Procedure: ARTHROSCOPY KNEE;  Surgeon: Earnestine Leys, MD;  Location: [facility] ORS;  Service: Orthopedics;  Laterality: Right;  partial arthroscopic medial menisectomy   LAPAROSCOPIC APPENDECTOMY N/A 06/02/2021   Procedure: APPENDECTOMY LAPAROSCOPIC;  Surgeon: Ronny Bacon, MD;  Location: [facility] ORS;  Service: General;  Laterality: N/A;   TOTAL KNEE ARTHROPLASTY Right 04/22/2015   Procedure: TOTAL KNEE ARTHROPLASTY;  Surgeon: Earnestine Leys, MD;  Location: [facility] ORS;  Service: Orthopedics;  Laterality: Right;   TOTAL KNEE ARTHROPLASTY Left 10/30/2017   Procedure: TOTAL KNEE ARTHROPLASTY;  Surgeon: Earnestine Leys, MD;  Location: [facility] ORS;  Service: Orthopedics;   Laterality: Left;   TOTAL KNEE REVISION Left 01/02/2018   Procedure: poly exchange of tibia and patella left knee;  Surgeon: Earnestine Leys, MD;  Location: [facility] ORS;  Service: Orthopedics;  Laterality: Left;   UMBILICAL HERNIA REPAIR  06/02/2021   Procedure: HERNIA REPAIR UMBILICAL ADULT;  Surgeon: Ronny Bacon, MD;  Location: [facility] ORS;  Service: General;;   Family History:  Family History  Problem Relation Age of Onset   CVA Mother        deceased at age 78   Depression Brother        Died by suicide at age 89   Family Psychiatric  History: Depression, Complete suicide, brother Social History:  Social History   Substance and Sexual Activity  Alcohol Use Yes   Comment: rare     Social History   Substance and Sexual Activity  Drug Use Yes   Types: Marijuana, Cocaine    Social History   Socioeconomic History   Marital status: Single    Spouse name: Not on file   Number of children: Not on file   Years of education: Not on file   Highest education level: Not on file  Occupational History   Not on file  Tobacco Use   Smoking status: Former    Packs/day: 0.75    Years: 20.00    Additional pack years: 0.00    Total pack years: 15.00    Types: Cigarettes    Quit date: 05/16/1984    Years since quitting: 37.9   Smokeless tobacco: Never  Vaping Use   Vaping  Use: Never used  Substance and Sexual Activity   Alcohol use: Yes    Comment: rare   Drug use: Yes    Types: Marijuana, Cocaine   Sexual activity: Not on file  Other Topics Concern   Not on file  Social History Narrative   Not on file   Social Determinants of Health   Financial Resource Strain: Not on file  Food Insecurity: Not on file  Transportation Needs: Not on file  Physical Activity: Not on file  Stress: Not on file  Social Connections: Not on file   Additional Social History:    Allergies:  No Known Allergies  Labs:  Results for orders placed or performed during the hospital encounter of  04/13/22 (from the past 48 hour(s))  CBC with Differential     Status: None   Collection Time: 04/13/22  4:43 PM  Result Value Ref Range   WBC 8.8 4.0 - 10.5 K/uL   RBC 4.64 4.22 - 5.81 MIL/uL   Hemoglobin 13.8 13.0 - 17.0 g/dL   HCT 41.0 39.0 - 52.0 %   MCV 88.4 80.0 - 100.0 fL   MCH 29.7 26.0 - 34.0 pg   MCHC 33.7 30.0 - 36.0 g/dL   RDW 14.9 11.5 - 15.5 %   Platelets 209 150 - 400 K/uL   nRBC 0.0 0.0 - 0.2 %   Neutrophils Relative % 57 %   Neutro Abs 5.2 1.7 - 7.7 K/uL   Lymphocytes Relative 30 %   Lymphs Abs 2.6 0.7 - 4.0 K/uL   Monocytes Relative 10 %   Monocytes Absolute 0.8 0.1 - 1.0 K/uL   Eosinophils Relative 1 %   Eosinophils Absolute 0.1 0.0 - 0.5 K/uL   Basophils Relative 1 %   Basophils Absolute 0.1 0.0 - 0.1 K/uL   Immature Granulocytes 1 %   Abs Immature Granulocytes 0.04 0.00 - 0.07 K/uL    Comment: Performed at [facility], [address] [place], [place] [address]  Comprehensive metabolic panel     Status: Abnormal   Collection Time: 04/13/22  4:43 PM  Result Value Ref Range   Sodium 138 135 - 145 mmol/L   Potassium 3.9 3.5 - 5.1 mmol/L   Chloride 106 98 - 111 mmol/L   CO2 20 (L) 22 - 32 mmol/L   Glucose, Bld 98 70 - 99 mg/dL    Comment: Glucose reference range applies only to samples taken after fasting for at least 8 hours.   BUN 20 6 - 20 mg/dL   Creatinine, Ser 1.13 0.61 - 1.24 mg/dL   Calcium 9.7 8.9 - 10.3 mg/dL   Total Protein 7.8 6.5 - 8.1 g/dL   Albumin 4.1 3.5 - 5.0 g/dL   AST 33 15 - 41 U/L   ALT 21 0 - 44 U/L   Alkaline Phosphatase 57 38 - 126 U/L   Total Bilirubin 1.5 (H) 0.3 - 1.2 mg/dL   GFR, Estimated >60 >60 mL/min    Comment: (NOTE) Calculated using the CKD-EPI Creatinine Equation (2021)    Anion gap 12 5 - 15    Comment: Performed at [facility], [address] [place], [place] [address]  Lipase, blood     Status: Abnormal   Collection Time: 04/13/22  4:43 PM  Result Value Ref Range   Lipase 56 (H) 11 -  51 U/L    Comment: Performed at [facility], [address], [place], [place] [address]  Urine Drug Screen, Qualitative (  [place] only)     Status: Abnormal   Collection Time: 04/13/22  7:15 PM  Result Value Ref Range   Tricyclic, Ur Screen NONE DETECTED NONE DETECTED   Amphetamines, Ur Screen NONE DETECTED NONE DETECTED   MDMA (Ecstasy)Ur Screen NONE DETECTED NONE DETECTED   Cocaine Metabolite,Ur [place] POSITIVE (A) NONE DETECTED   Opiate, Ur Screen POSITIVE (A) NONE DETECTED   Phencyclidine (PCP) Ur S NONE DETECTED NONE DETECTED   Cannabinoid 50 Ng, Ur [place] POSITIVE (A) NONE DETECTED   Barbiturates, Ur Screen NONE DETECTED NONE DETECTED   Benzodiazepine, Ur Scrn NONE DETECTED NONE DETECTED   Methadone Scn, Ur NONE DETECTED NONE DETECTED    Comment: (NOTE) Tricyclics + metabolites, urine    Cutoff 1000 ng/mL Amphetamines + metabolites, urine  Cutoff 1000 ng/mL MDMA (Ecstasy), urine              Cutoff 500 ng/mL Cocaine Metabolite, urine          Cutoff 300 ng/mL Opiate + metabolites, urine        Cutoff 300 ng/mL Phencyclidine (PCP), urine         Cutoff 25 ng/mL Cannabinoid, urine                 Cutoff 50 ng/mL Barbiturates + metabolites, urine  Cutoff 200 ng/mL Benzodiazepine, urine              Cutoff 200 ng/mL Methadone, urine                   Cutoff 300 ng/mL  The urine drug screen provides only a preliminary, unconfirmed analytical test result and should not be used for non-medical purposes. Clinical consideration and professional judgment should be applied to any positive drug screen result due to possible interfering substances. A more specific alternate chemical method must be used in order to obtain a confirmed analytical result. Gas chromatography / mass spectrometry (GC/MS) is the preferred confirm atory method. Performed at [facility], [address] [place], [place] [address]   Urinalysis, Routine w reflex microscopic -Urine, Clean Catch      Status: Abnormal   Collection Time: 04/13/22  7:30 PM  Result Value Ref Range   Color, Urine STRAW (A) YELLOW   APPearance CLEAR (A) CLEAR   Specific Gravity, Urine 1.028 1.005 - 1.030   pH 6.0 5.0 - 8.0   Glucose, UA NEGATIVE NEGATIVE mg/dL   Hgb urine dipstick NEGATIVE NEGATIVE   Bilirubin Urine NEGATIVE NEGATIVE   Ketones, ur NEGATIVE NEGATIVE mg/dL   Protein, ur NEGATIVE NEGATIVE mg/dL   Nitrite NEGATIVE NEGATIVE   Leukocytes,Ua NEGATIVE NEGATIVE    Comment: Performed at [facility], [address], [place], [place] [address]  Acetaminophen level     Status: Abnormal   Collection Time: 04/13/22  7:38 PM  Result Value Ref Range   Acetaminophen (Tylenol), Serum <10 (L) 10 - 30 ug/mL    Comment: (NOTE) Therapeutic concentrations vary significantly. A range of 10-30 ug/mL  may be an effective concentration for many patients. However, some  are best treated at concentrations outside of this range. Acetaminophen concentrations >150 ug/mL at 4 hours after ingestion  and >50 ug/mL at 12 hours after ingestion are often associated with  toxic reactions.  Performed at [facility], [address] [place],  [id number]   Salicylate level     Status: Abnormal   Collection Time: 04/13/22  7:38 PM  Result Value Ref Range  Salicylate Lvl [id number] (L) 7.0 - 30.0 mg/dL    Comment: Performed at [facility], [address] [place], [place] [address]  Ethanol     Status: None   Collection Time: 04/13/22  7:38 PM  Result Value Ref Range   Alcohol, Ethyl (B) <10 <10 mg/dL    Comment: (NOTE) Lowest detectable limit for serum alcohol is 10 mg/dL.  For medical purposes only. Performed at [facility], [address] [place], [place] [address]     No current facility-administered medications for this encounter.   Current Outpatient Medications  Medication Sig Dispense Refill   doxepin (SINEQUAN) 100 MG capsule Take 1 capsule (100 mg total)  by mouth at bedtime. 30 capsule 3   gabapentin (NEURONTIN) 300 MG capsule Take 300 mg by mouth 3 (three) times daily.     gabapentin (NEURONTIN) 400 MG capsule Take 1 capsule (400 mg total) by mouth 3 (three) times daily. (Patient not taking: Reported on 07/17/2021) 90 capsule 3   haloperidol (HALDOL) 5 MG tablet Take 5 mg by mouth 2 (two) times daily.     traZODone (DESYREL) 50 MG tablet Take 0.5 tablets (25 mg total) by mouth 3 (three) times daily as needed (Anxiety). 90 tablet 3    Musculoskeletal: Strength & Muscle Tone: within normal limits Gait & Station: unsteady Patient leans: N/A Psychiatric Specialty Exam:  Presentation  General Appearance:  Appropriate for Environment  Eye Contact: Fair  Speech: Clear and Coherent  Speech Volume: Decreased  Handedness: Right   Mood and Affect  Mood: Euthymic  Affect: Congruent   Thought Process  Thought Processes: Coherent  Descriptions of Associations:Intact  Orientation:Full (Time, Place and Person)  Thought Content:Logical  History of Schizophrenia/Schizoaffective disorder:No  Duration of Psychotic Symptoms:N/A  Hallucinations:Hallucinations: Command Description of Command Hallucinations: To harm himself  Ideas of Reference:None  Suicidal Thoughts:Suicidal Thoughts: No  Homicidal Thoughts:Homicidal Thoughts: No   Sensorium  Memory: Immediate Good; Recent Good; Remote Good  Judgment: Fair  Insight: Fair   Community education officer  Concentration: Good  Attention Span: Good  Recall: Good  Fund of Knowledge: Good  Language: Good   Psychomotor Activity  Psychomotor Activity: Psychomotor Activity: Normal   Assets  Assets: Resilience; Physical Health   Sleep  Sleep: Sleep: Fair   Physical Exam: Physical Exam Vitals and nursing note reviewed.  Constitutional:      Appearance: Normal appearance. He is normal weight.  HENT:     Head: Normocephalic and atraumatic.     Right  Ear: External ear normal.     Left Ear: External ear normal.     Nose: Nose normal.     Mouth/Throat:     Mouth: Mucous membranes are moist.  Cardiovascular:     Pulses: Normal pulses.  Pulmonary:     Effort: Pulmonary effort is normal.  Musculoskeletal:        General: Normal range of motion.     Cervical back: Normal range of motion and neck supple.  Neurological:     General: No focal deficit present.     Mental Status: He is alert and oriented to person, place, and time.  Psychiatric:        Attention and Perception: Attention normal. He perceives auditory hallucinations.        Mood and Affect: Mood is depressed. Affect is inappropriate.        Speech: Speech normal.        Behavior: Behavior normal. Behavior is cooperative.  Thought Content: Thought content normal.        Cognition and Memory: Cognition and memory normal.        Judgment: Judgment is inappropriate.    Review of Systems  Psychiatric/Behavioral:  Positive for depression, hallucinations and substance abuse.   All other systems reviewed and are negative.  Blood pressure 118/84, pulse 76, temperature 98.3 F (36.8 C), temperature source Oral, resp. rate 20, SpO2 100 %. There is no height or weight on file to calculate BMI.  Treatment Plan Summary: Plan   Patient does meet the criteria for psychiatric inpatient admission  Disposition: Recommend psychiatric Inpatient admission when medically cleared. Supportive therapy provided about ongoing stressors.  Caroline Sauger, NP 04/13/2022 10:22 PM

## 2022-04-14 ENCOUNTER — Encounter: Payer: Self-pay | Admitting: Psychiatry

## 2022-04-14 ENCOUNTER — Inpatient Hospital Stay
Admission: AD | Admit: 2022-04-14 | Discharge: 2022-04-18 | DRG: 885 | Disposition: A | Discharge status: Home / Self Care | Payer: 59 | Source: Intra-hospital | Attending: Psychiatry | Admitting: Psychiatry

## 2022-04-14 ENCOUNTER — Other Ambulatory Visit: Payer: Self-pay

## 2022-04-14 ENCOUNTER — Inpatient Hospital Stay: Admission: RE | Admit: 2022-04-14 | Payer: 59 | Source: Intra-hospital | Admitting: Psychiatry

## 2022-04-14 DIAGNOSIS — F332 Major depressive disorder, recurrent severe without psychotic features: Secondary | ICD-10-CM | POA: Diagnosis not present

## 2022-04-14 DIAGNOSIS — Z823 Family history of stroke: Secondary | ICD-10-CM

## 2022-04-14 DIAGNOSIS — R45851 Suicidal ideations: Secondary | ICD-10-CM | POA: Diagnosis not present

## 2022-04-14 DIAGNOSIS — F4310 Post-traumatic stress disorder, unspecified: Secondary | ICD-10-CM | POA: Diagnosis present

## 2022-04-14 DIAGNOSIS — N2889 Other specified disorders of kidney and ureter: Secondary | ICD-10-CM | POA: Diagnosis not present

## 2022-04-14 DIAGNOSIS — G4700 Insomnia, unspecified: Secondary | ICD-10-CM | POA: Diagnosis not present

## 2022-04-14 DIAGNOSIS — F1490 Cocaine use, unspecified, uncomplicated: Secondary | ICD-10-CM | POA: Diagnosis present

## 2022-04-14 DIAGNOSIS — R1031 Right lower quadrant pain: Secondary | ICD-10-CM | POA: Diagnosis not present

## 2022-04-14 DIAGNOSIS — Z818 Family history of other mental and behavioral disorders: Secondary | ICD-10-CM | POA: Diagnosis not present

## 2022-04-14 DIAGNOSIS — Z1152 Encounter for screening for COVID-19: Secondary | ICD-10-CM

## 2022-04-14 DIAGNOSIS — M25551 Pain in right hip: Secondary | ICD-10-CM | POA: Diagnosis not present

## 2022-04-14 DIAGNOSIS — F419 Anxiety disorder, unspecified: Secondary | ICD-10-CM | POA: Diagnosis present

## 2022-04-14 DIAGNOSIS — F1410 Cocaine abuse, uncomplicated: Secondary | ICD-10-CM | POA: Diagnosis not present

## 2022-04-14 DIAGNOSIS — K219 Gastro-esophageal reflux disease without esophagitis: Secondary | ICD-10-CM | POA: Diagnosis present

## 2022-04-14 DIAGNOSIS — Z765 Malingerer [conscious simulation]: Secondary | ICD-10-CM | POA: Diagnosis not present

## 2022-04-14 DIAGNOSIS — G2581 Restless legs syndrome: Secondary | ICD-10-CM | POA: Diagnosis present

## 2022-04-14 DIAGNOSIS — F6089 Other specific personality disorders: Secondary | ICD-10-CM | POA: Diagnosis not present

## 2022-04-14 DIAGNOSIS — I10 Essential (primary) hypertension: Secondary | ICD-10-CM | POA: Diagnosis not present

## 2022-04-14 DIAGNOSIS — Z9151 Personal history of suicidal behavior: Secondary | ICD-10-CM

## 2022-04-14 DIAGNOSIS — F609 Personality disorder, unspecified: Secondary | ICD-10-CM | POA: Diagnosis present

## 2022-04-14 DIAGNOSIS — Z5941 Food insecurity: Secondary | ICD-10-CM | POA: Diagnosis not present

## 2022-04-14 DIAGNOSIS — Z87891 Personal history of nicotine dependence: Secondary | ICD-10-CM | POA: Diagnosis not present

## 2022-04-14 DIAGNOSIS — Z5900 Homelessness unspecified: Secondary | ICD-10-CM

## 2022-04-14 DIAGNOSIS — Z96653 Presence of artificial knee joint, bilateral: Secondary | ICD-10-CM | POA: Diagnosis present

## 2022-04-14 LAB — SARS CORONAVIRUS 2 BY RT PCR: SARS Coronavirus 2 by RT PCR: NEGATIVE

## 2022-04-14 MED ORDER — LORAZEPAM 2 MG/ML IJ SOLN: 2.0000 mg | Freq: Three times a day (TID) | INTRAMUSCULAR | Status: DC | PRN

## 2022-04-14 MED ORDER — HALOPERIDOL 5 MG PO TABS
5.0000 mg | ORAL_TABLET | Freq: Three times a day (TID) | ORAL | Status: DC | PRN
  Administered 2022-04-14 – 2022-04-17 (×3): 5 mg via ORAL
  Filled 2022-04-14 (×3): qty 1

## 2022-04-14 MED ORDER — DIPHENHYDRAMINE HCL 50 MG/ML IJ SOLN: 50.0000 mg | Freq: Three times a day (TID) | INTRAMUSCULAR | Status: DC | PRN

## 2022-04-14 MED ORDER — ENALAPRIL MALEATE 10 MG PO TABS
10.0000 mg | ORAL_TABLET | Freq: Every day | ORAL | Status: DC
  Administered 2022-04-14 – 2022-04-17 (×4): 10 mg via ORAL
  Filled 2022-04-14 (×4): qty 1

## 2022-04-14 MED ORDER — LORAZEPAM 2 MG PO TABS
2.0000 mg | ORAL_TABLET | Freq: Three times a day (TID) | ORAL | Status: DC | PRN
  Administered 2022-04-14: 2 mg via ORAL
  Filled 2022-04-14: qty 1

## 2022-04-14 MED ORDER — LORAZEPAM 2 MG PO TABS
2.0000 mg | ORAL_TABLET | Freq: Once | ORAL | Status: AC
  Administered 2022-04-14: 2 mg via ORAL
  Filled 2022-04-14: qty 1

## 2022-04-14 MED ORDER — ALUM & MAG HYDROXIDE-SIMETH 200-200-20 MG/5ML PO SUSP: 30.0000 mL | ORAL | Status: DC | PRN

## 2022-04-14 MED ORDER — DOXEPIN HCL 50 MG PO CAPS
50.0000 mg | ORAL_CAPSULE | Freq: Every day | ORAL | Status: DC
  Administered 2022-04-14 – 2022-04-15 (×2): 50 mg via ORAL
  Filled 2022-04-14 (×2): qty 1

## 2022-04-14 MED ORDER — TRAZODONE HCL 50 MG PO TABS
50.0000 mg | ORAL_TABLET | Freq: Every evening | ORAL | Status: DC | PRN
  Administered 2022-04-14 – 2022-04-17 (×3): 50 mg via ORAL
  Filled 2022-04-14 (×4): qty 1

## 2022-04-14 MED ORDER — ACETAMINOPHEN 500 MG PO TABS
1000.0000 mg | ORAL_TABLET | Freq: Once | ORAL | Status: AC
  Administered 2022-04-14: 1000 mg via ORAL
  Filled 2022-04-14: qty 2

## 2022-04-14 MED ORDER — DIPHENHYDRAMINE HCL 25 MG PO CAPS: 50.0000 mg | ORAL_CAPSULE | Freq: Three times a day (TID) | ORAL | Status: DC | PRN

## 2022-04-14 MED ORDER — MAGNESIUM HYDROXIDE 400 MG/5ML PO SUSP: 30.0000 mL | Freq: Every day | ORAL | Status: DC | PRN

## 2022-04-14 MED ORDER — HALOPERIDOL LACTATE 5 MG/ML IJ SOLN: 5.0000 mg | Freq: Three times a day (TID) | INTRAMUSCULAR | Status: DC | PRN

## 2022-04-14 MED ORDER — ACETAMINOPHEN 325 MG PO TABS
650.0000 mg | ORAL_TABLET | Freq: Four times a day (QID) | ORAL | Status: DC | PRN
  Administered 2022-04-14 – 2022-04-18 (×6): 650 mg via ORAL
  Filled 2022-04-14 (×6): qty 2

## 2022-04-14 NOTE — Progress Notes (Signed)
Patient presents voluntary from [facility] ED after initially coming in for hip pain. He endorses auditory and visual hallucinations. The voices tell him to kill himself. He describes the visual hallucinations as flashbacks or his "brother coming to tap me". Patient denies SI and HI. He endorses depression and anxiety and rates them as a 10/10. He says this started around 2 years ago, when his wife was diagnosed with dementia and began living in a nursing home. He had been homeless up until 3 months ago, when he started living with a friend. He says he has not been eating or sleeping well and has lost a significant amount of weight. He endorses occasionally smoking marijuana, but denies other substance use.   Patient says he has been off psychiatric medications because there was a lapse in his Medicaid. He says he is back on insurance, but had difficulty getting an outpatient appointment. He is interested in starting medications while in the hospital.  Skin assessment completed with Nicole Kindred, RN. Patient has a bruise on the right lower abd. Patient is ambulatory and does not use assistive devices. However, he does report weakness in both legs and pain in his hips and shoulders due to arthritis. He says his pain always stays at a 6-7.  Patient oriented to the unit and educated on unit policies. Patient verbalized understanding. Patient remains safe on the unit at this time.

## 2022-04-14 NOTE — ED Notes (Signed)
Pt stating having R hip pain and also stating feeling anxious notified MD of patients wishes for medicine to help.

## 2022-04-14 NOTE — ED Notes (Signed)
Lunch tray given. 

## 2022-04-14 NOTE — ED Notes (Signed)
Hospital meal provided, pt tolerated w/o complaints.  Waste discarded appropriately.  

## 2022-04-14 NOTE — Plan of Care (Signed)

## 2022-04-14 NOTE — BH Assessment (Signed)
Comprehensive Clinical Assessment (CCA) Note  04/14/2022 [name] [id number]  Chief Complaint:  Chief Complaint  Patient presents with   Hip Pain   Visit Diagnosis: Major Depression   Victor Penna. [name] is a 62 year old male who presents to the ER initially due to having hip pain. While in the ER he reported he was having AV/H. He states he hasn't had his mental health medication for approximately two years.  During the interview, the patient was calm, cooperative and pleasant. He was able to provide appropriate answers to the questions. He admits to the use of cannabis but the use of any other mind-altering substances. However, his UDS was positive for cocaine and opioids.  CCA Screening, Triage and Referral (STR)  Patient Reported Information How did you hear about [place]? Self  What Is the Reason for Your Visit/Call Today? Patient came to the ER due to hip pain but while present, he requested medications for his mental health. Reports he hasn't had them for approximately two years.  How Long Has This Been Causing You Problems? > than 6 months  What Do You Feel Would Help You the Most Today? Treatment for Depression or other mood problem; Alcohol or Drug Use Treatment   Have You Recently Had Any Thoughts About Hurting Yourself? No  Are You Planning to Commit Suicide/Harm Yourself At This time? No   Flowsheet Row ED from 04/13/2022 in [facility] Emergency Department at [facility] ED to Hosp-Admission (Discharged) from 07/11/2021 in [facility] ED from 06/27/2021 in [facility] Emergency Department at [place] No Risk Low Risk High Risk       Have you Recently Had Thoughts About [place]? No  Are You Planning to Harm Someone at This Time? No  Explanation: No data recorded  Have You Used Any Alcohol or Drugs in the Past 24 Hours? Yes  What Did You Use and How Much? Cocaine and  Cannabis   Do You Currently Have a Therapist/Psychiatrist? No  Name of Therapist/Psychiatrist:    Have You Been Recently Discharged From Any Office Practice or Programs? No  Explanation of Discharge From Practice/Program: No data recorded    CCA Screening Triage Referral Assessment Type of Contact: Face-to-Face  Telemedicine Service Delivery:   Is this Initial or Reassessment?   Date Telepsych consult ordered in [facility]:    Time Telepsych consult ordered in [facility]:    Location of Assessment: [facility] ED  Provider Location: [facility] ED   Collateral Involvement: None provided   Does Patient Have a [place]? No  Legal Guardian Contact Information: No data recorded Copy of Legal Guardianship Form: No data recorded Legal Guardian Notified of Arrival: No data recorded Legal Guardian Notified of Pending Discharge: No data recorded If Minor and Not Living with Parent(s), Who has Custody? n/a  Is CPS involved or ever been involved? Never  Is APS involved or ever been involved? Never   Patient Determined To Be At Risk for Harm To Self or Others Based on Review of Patient Reported Information or Presenting Complaint? No  Method: No data recorded Availability of Means: No data recorded Intent: No data recorded Notification Required: No data recorded Additional Information for Danger to Others Potential: No data recorded Additional Comments for Danger to Others Potential: No data recorded Are There Guns or Other Weapons in Your Home? No  Types of Guns/Weapons: No data recorded Are These Weapons Safely Secured?  No data recorded Who Could Verify You Are Able To Have These Secured: No data recorded Do You Have any Outstanding Charges, Pending Court Dates, Parole/Probation? No data recorded Contacted To Inform of Risk of Harm To Self or Others: No data recorded   Does Patient Present under Involuntary Commitment? No    [place] of Residence:  [place]   Patient Currently Receiving the Following Services: Not Receiving Services   Determination of Need: Emergent (2 hours)   Options For Referral: Inpatient Hospitalization     CCA Biopsychosocial Patient Reported Schizophrenia/Schizoaffective Diagnosis in Past: No   Strengths: Some insight, reports of stable housing, and seeking help.   Mental Health Symptoms Depression:   Difficulty Concentrating; Sleep (too much or little)   Duration of Depressive symptoms:  Duration of Depressive Symptoms: Greater than two weeks   Mania:   None   Anxiety:    Worrying; Restlessness   Psychosis:   Hallucinations   Duration of Psychotic symptoms:  Duration of Psychotic Symptoms: Greater than six months   Trauma:   N/A   Obsessions:   N/A   Compulsions:   N/A   Inattention:   N/A   Hyperactivity/Impulsivity:   N/A   Oppositional/Defiant Behaviors:   N/A   Emotional Irregularity:   N/A   Other Mood/Personality Symptoms:   Pt high self harm risk    Mental Status Exam Appearance and self-care  Stature:   Average   Weight:   Average weight   Clothing:   Neat/clean; Age-appropriate   Grooming:   Normal   Cosmetic use:   None   Posture/gait:   Normal   Motor activity:   -- (Within normal range)   Sensorium  Attention:   Normal   Concentration:   Normal   Orientation:   X5   Recall/memory:   Normal   Affect and Mood  Affect:   Depressed; Full Range   Mood:   Depressed   Relating  Eye contact:   Normal   Facial expression:   Depressed; Responsive   Attitude toward examiner:   Cooperative   Thought and Language  Speech flow:  Clear and Coherent; Normal   Thought content:   Appropriate to Mood and Circumstances   Preoccupation:   None   Hallucinations:   Auditory; Visual   Organization:   Coherent; Intact   [employer] of Knowledge:   Fair   Intelligence:   Average   Abstraction:    Functional   Judgement:   Fair   Art therapist:   Adequate   Insight:   Fair   Decision Making:   Impulsive   Social Functioning  Social Maturity:   Impulsive   Social Judgement:   Normal; "Games developer"   Stress  Stressors:   Housing; Teacher, music Ability:   Advice worker Deficits:   Decision making   Supports:   Support needed     Religion: Religion/Spirituality Are You A Religious Person?: No  Leisure/Recreation: Leisure / Recreation Do You Have Hobbies?: No  Exercise/Diet: Exercise/Diet Do You Exercise?: No Have You Gained or Lost A Significant Amount of Weight in the Past Six Months?: No Do You Follow a Special Diet?: No Do You Have Any Trouble Sleeping?: No   CCA Employment/Education Employment/Work Situation: Employment / Work Situation Employment Situation: Unemployed  Education: Education Is Patient Currently Attending School?: No Did You Have An Individualized Education Program (IIEP): No Did You Have Any Difficulty At [employer]?: No  Patient's Education Has Been Impacted by Current Illness: No   CCA Family/Childhood History Family and Relationship History: Family history Marital status: Single  Childhood History:  Childhood History Did patient suffer any verbal/emotional/physical/sexual abuse as a child?: No Did patient suffer from severe childhood neglect?: No Has patient ever been sexually abused/assaulted/raped as an adolescent or adult?: No Was the patient ever a victim of a crime or a disaster?: No Witnessed domestic violence?: No Has patient been affected by domestic violence as an adult?: No       CCA Substance Use Alcohol/Drug Use: Alcohol / Drug Use Pain Medications: See MAR Prescriptions: See MAR Over the Counter: See MAR History of alcohol / drug use?: Yes Longest period of sobriety (when/how long): Unable to quantify Substance #1 Name of Substance 1: Cannabis 1 - Amount (size/oz): Unable to  quantify 1 - Frequency: Once a week 1 - Last Use / Amount: Unable to quantify 1- Route of Use: Smoke Substance #2 Name of Substance 2: Cocaine 2 - Frequency: Unable to quantify 2 - Last Use / Amount: Unable to quantify                     ASAM's:  Six Dimensions of Multidimensional Assessment  Dimension 1:  Acute Intoxication and/or Withdrawal Potential:      Dimension 2:  Biomedical Conditions and Complications:      Dimension 3:  Emotional, Behavioral, or Cognitive Conditions and Complications:     Dimension 4:  Readiness to Change:     Dimension 5:  Relapse, Continued use, or Continued Problem Potential:     Dimension 6:  Recovery/Living Environment:     ASAM Severity Score:    ASAM Recommended Level of Treatment:     Substance use Disorder (SUD)    Recommendations for Services/Supports/Treatments:    Discharge Disposition:    DSM5 Diagnoses: Patient Active Problem List   Diagnosis Date Noted   Gastroenteritis 07/13/2021   Intractable nausea and vomiting 07/11/2021   Malingering 06/28/2021   Cocaine use    Laceration of left forearm    Major depressive disorder, recurrent severe without psychotic features 06/18/2021   Septic shock 06/13/2021   AKI (acute kidney injury)    Non-traumatic rhabdomyolysis    Acute appendicitis 06/02/2021   Cellulitis of right hand 05/29/2021   Effusion of bursa of left knee 05/29/2021   Depression 05/29/2021   Major depressive disorder, recurrent episode, moderate 05/15/2021   Cluster B personality disorder 05/03/2021   Bipolar disorder 01/18/2021   Essential hypertension 01/18/2021   Alcohol abuse 01/18/2021     Referrals to Alternative Service(s): Referred to Alternative Service(s):   Place:   Date:   Time:    Referred to Alternative Service(s):   Place:   Date:   Time:    Referred to Alternative Service(s):   Place:   Date:   Time:    Referred to Alternative Service(s):   Place:   Date:   Time:     Gunnar Fusi MS, LCAS, [facility], [facility] Therapeutic Triage Specialist 04/14/2022 10:43 AM

## 2022-04-14 NOTE — Tx Team (Signed)
Initial Treatment Plan 04/14/2022 2:58 PM [name] [id number]    PATIENT STRESSORS: Financial difficulties   Medication change or noncompliance   Traumatic event     PATIENT STRENGTHS: Capable of independent living  Communication skills  Motivation for treatment/growth    PATIENT IDENTIFIED PROBLEMS: Has not been able to get medications due to lapse in insurance  Auditory and visual hallucinations                   DISCHARGE CRITERIA:  Improved stabilization in mood, thinking, and/or behavior Reduction of life-threatening or endangering symptoms to within safe limits  PRELIMINARY DISCHARGE PLAN: Return to previous living arrangement  PATIENT/FAMILY INVOLVEMENT: This treatment plan has been presented to and reviewed with the patient, [name].  The patient has been given the opportunity to ask questions and make suggestions.  Sunday Spillers, RN 04/14/2022, 2:58 PM

## 2022-04-14 NOTE — ED Notes (Signed)
Pt came in for R hip pain which he was seen and evaluated for. Pt then stated feels like he needs a psych evaluation. He does not want to hurt himself but is having hallucinations and has not been on his medications in recent months and wants to get back on them.

## 2022-04-14 NOTE — ED Notes (Signed)
Received report from Hannah RN. 

## 2022-04-14 NOTE — Group Note (Signed)
Date:  04/14/2022 Time:  9:03 PM  Group Topic/Focus:  Wrap-Up Group:   The focus of this group is to help patients review their daily goal of treatment and discuss progress on daily workbooks.    Participation Level:  Active  Participation Quality:  Appropriate and Attentive  Affect:  Appropriate and Excited  Cognitive:  Alert and Appropriate  Insight: Appropriate and Good  Engagement in Group:  Developing/Improving, Engaged, and Supportive  Modes of Intervention:  Clarification, Education, and Support  Additional Comments:     Abdulhadi Stopa 04/14/2022, 9:03 PM

## 2022-04-15 DIAGNOSIS — F332 Major depressive disorder, recurrent severe without psychotic features: Secondary | ICD-10-CM

## 2022-04-15 MED ORDER — ADULT MULTIVITAMIN W/MINERALS CH
1.0000 | ORAL_TABLET | Freq: Every day | ORAL | Status: DC
  Administered 2022-04-15 – 2022-04-18 (×4): 1 via ORAL
  Filled 2022-04-15 (×4): qty 1

## 2022-04-15 MED ORDER — ENSURE ENLIVE PO LIQD
237.0000 mL | Freq: Two times a day (BID) | ORAL | Status: DC
  Administered 2022-04-16 – 2022-04-17 (×3): 237 mL via ORAL

## 2022-04-15 MED ORDER — FLUOXETINE HCL 20 MG PO CAPS
20.0000 mg | ORAL_CAPSULE | Freq: Every day | ORAL | Status: DC
  Administered 2022-04-15 – 2022-04-18 (×4): 20 mg via ORAL
  Filled 2022-04-15 (×4): qty 1

## 2022-04-15 MED ORDER — ENSURE ENLIVE PO LIQD
237.0000 mL | Freq: Three times a day (TID) | ORAL | Status: DC
  Administered 2022-04-15 – 2022-04-18 (×6): 237 mL via ORAL

## 2022-04-15 MED ORDER — OLANZAPINE 10 MG PO TABS
10.0000 mg | ORAL_TABLET | Freq: Every day | ORAL | Status: DC
  Administered 2022-04-15 – 2022-04-17 (×3): 10 mg via ORAL
  Filled 2022-04-15 (×3): qty 1

## 2022-04-15 NOTE — Group Note (Signed)
Date:  04/15/2022 Time:  10:51 PM  Group Topic/Focus:  Wrap-Up Group:   The focus of this group is to help patients review their daily goal of treatment and discuss progress on daily workbooks.    Participation Level:  Active  Participation Quality:  Appropriate  Affect:  Appropriate  Cognitive:  Alert and Appropriate  Insight: Appropriate  Engagement in Group:  Developing/Improving and Engaged  Modes of Intervention:  Activity  Additional Comments:     Jobany Montellano 04/15/2022, 10:51 PM

## 2022-04-15 NOTE — H&P (Signed)
Psychiatric Admission Assessment Adult  Patient Identification: [name] MRN:  [id number] Date of Evaluation:  04/15/2022 Chief Complaint:  Major depressive disorder, recurrent severe without psychotic features [F33.2] Principal Diagnosis: Major depressive disorder, recurrent severe without psychotic features Diagnosis:  Principal Problem:   Major depressive disorder, recurrent severe without psychotic features Active Problems:   Essential hypertension   Cocaine use  History of Present Illness: Patient seen and chart reviewed.  62 year old man with a history of depression and substance abuse presented to the emergency room complaining of depressed mood with suicidal thoughts.  He says that he has been having depression with low mood negativity and hopelessness and thoughts of killing himself for years but has been worse over the last couple months.  Also complains of frequent auditory and visual hallucinations.  Patient says he has been off of his medicines for "2 years".  This is a little bit of an exaggeration since he has had several inpatient stays in that time but apparently has not been following up.  He is living by himself.  Admits that he has been using cocaine and marijuana fairly fairly regularly but denies alcohol use. Associated Signs/Symptoms: Depression Symptoms:  depressed mood, insomnia, feelings of worthlessness/guilt, difficulty concentrating, suicidal thoughts without plan, (Hypo) Manic Symptoms:  Impulsivity, Anxiety Symptoms:  Excessive Worry, Psychotic Symptoms:  Hallucinations: Auditory Visual Paranoia, PTSD Symptoms: Negative Total Time spent with patient: 45 minutes  Past Psychiatric History: Patient has a past history of depression and substance abuse with previous suicide attempts.  Has had previous hospitalizations here.  Apparently did well when he was on medicine.  Is the patient at risk to self? Yes.    Has the patient been a risk to self in the  past 6 months? Yes.    Has the patient been a risk to self within the distant past? Yes.    Is the patient a risk to others? No.  Has the patient been a risk to others in the past 6 months? No.  Has the patient been a risk to others within the distant past? No.   [place] Scale:  Flowsheet Row Admission (Current) from 04/14/2022 in [facility] INPATIENT BEHAVIORAL MEDICINE ED from 04/13/2022 in [facility] Emergency Department at [facility] ED to Hosp-Admission (Discharged) from 07/11/2021 in [facility]  C-SSRS RISK CATEGORY No Risk No Risk Low Risk        Prior Inpatient Therapy: Yes.   If yes, describe prior inpatient treatment including here at our hospital Prior Outpatient Therapy: Yes.   If yes, describe but seems to have been spotty with his follow-up  Alcohol Screening: 1. How often do you have a drink containing alcohol?: Never 2. How many drinks containing alcohol do you have on a typical day when you are drinking?: 1 or 2 3. How often do you have six or more drinks on one occasion?: Never AUDIT-C Score: 0 4. How often during the last year have you found that you were not able to stop drinking once you had started?: Never 5. How often during the last year have you failed to do what was normally expected from you because of drinking?: Never 6. How often during the last year have you needed a first drink in the morning to get yourself going after a heavy drinking session?: Never 7. How often during the last year have you had a feeling of guilt of remorse after drinking?: Never 8. How often during the last year have  you been unable to remember what happened the night before because you had been drinking?: Never 9. Have you or someone else been injured as a result of your drinking?: No 10. Has a relative or friend or a doctor or another health worker been concerned about your drinking or suggested you cut down?: No Alcohol Use Disorder Identification  Test Final Score (AUDIT): 0 Alcohol Brief Interventions/Follow-up: Patient Refused Substance Abuse History in the last 12 months:  Yes.   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 echo 5/17: EF 60-65%, no RWMA, LV dias fxn nl, LA mildly dilated, RV sys fxn nl, PASP nl, moderate sized circumferential pericardial effusion was identified, 2.12 cm around the LV free wall, <1 cm around the RV free wall. Features were not c/w tamponade physiology   PTSD (post-traumatic stress disorder)    Witnessed brother's suicide.   Restless leg syndrome    Seizures    Syncope     Past Surgical History:  Procedure Laterality Date   CYSTOSCOPY WITH URETEROSCOPY AND STENT PLACEMENT     ESOPHAGOGASTRODUODENOSCOPY N/A 01/11/2016   Procedure: ESOPHAGOGASTRODUODENOSCOPY (EGD);  Surgeon: Charlott RakesVincent Schooler, MD;  Location[facility] ENDOSCOPY;  Service: Endoscopy;  Laterality: N/A;   ESOPHAGOGASTRODUODENOSCOPY N/A 04/09/2020   Procedure: ESOPHAGOGASTRODUODENOSCOPY (EGD);  Surgeon: Wyline MoodAnna, Kiran, MD;  Location[facility] ENDOSCOPY;  Service: Gastroenterology;  Laterality: N/A;   INCISION AND DRAINAGE ABSCESS Left 01/02/2018   Procedure: INCISION AND DRAINAGE LEFT KNEE;  Surgeon: Deeann SaintMiller, Howard, MD;  Location: [facility] ORS;  Service: Orthopedics;  Laterality: Left;   JOINT REPLACEMENT Right    TKR   KNEE ARTHROSCOPY Right 06/25/2014   Procedure: ARTHROSCOPY KNEE;  Surgeon: Deeann SaintHoward Miller, MD;  Location: [facility] ORS;  Service: Orthopedics;   Laterality: Right;  partial arthroscopic medial menisectomy   LAPAROSCOPIC APPENDECTOMY N/A 06/02/2021   Procedure: APPENDECTOMY LAPAROSCOPIC;  Surgeon: Campbell Lernerodenberg, Denny, MD;  Location: [facility] ORS;  Service: General;  Laterality: N/A;   TOTAL KNEE ARTHROPLASTY Right 04/22/2015   Procedure: TOTAL KNEE ARTHROPLASTY;  Surgeon: Deeann SaintHoward Miller, MD;  Location: [facility] ORS;  Service: Orthopedics;  Laterality: Right;   TOTAL KNEE ARTHROPLASTY Left 10/30/2017   Procedure: TOTAL KNEE ARTHROPLASTY;  Surgeon: Deeann SaintMiller, Howard, MD;  Location: [facility] ORS;  Service: Orthopedics;  Laterality: Left;   TOTAL KNEE REVISION Left 01/02/2018   Procedure: poly exchange of tibia and patella left knee;  Surgeon: Deeann SaintMiller, Howard, MD;  Location: [facility] ORS;  Service: Orthopedics;  Laterality: Left;   UMBILICAL HERNIA REPAIR  06/02/2021   Procedure: HERNIA REPAIR UMBILICAL ADULT;  Surgeon: Campbell Lernerodenberg, Denny, MD;  Location: [facility] ORS;  Service: General;;   Family History:  Family History  Problem Relation Age of Onset   CVA Mother        deceased at age 60   Depression Brother        Died by suicide at age 220   Family Psychiatric  History: Depression in family including a suicide brother  Tobacco Screening:  Social History   Tobacco Use  Smoking Status Former   Packs/day: 0.75   Years: 20.00   Additional pack years: 0.00   Total pack years: 15.00   Types: Cigarettes   Quit date: 05/16/1984   Years since quitting: 37.9  Smokeless Tobacco Never    BH Tobacco Counseling     Are you interested in Tobacco Cessation Medications?  N/A, patient does not use tobacco products Counseled patient on smoking cessation:  N/A, patient does not use tobacco products Reason Tobacco Screening Not Completed: Patient Refused Screening       Social History:  Social History   Substance and Sexual Activity  Alcohol Use Not Currently   Comment: rare     Social History   Substance and Sexual Activity  Drug Use Yes   Types: Marijuana,  Cocaine    Additional Social History:                           Allergies:  No Known Allergies Lab Results:  Results for orders placed or performed during the hospital encounter of 04/13/22 (from the past 48 hour(s))  CBC with Differential     Status: None   Collection Time: 04/13/22  4:43 PM  Result Value Ref Range   WBC 8.8 4.0 - 10.5 K/uL   RBC 4.64 4.22 - 5.81 MIL/uL   Hemoglobin 13.8 13.0 - 17.0 g/dL   HCT 16.1 09.6 - 04.5 %   MCV 88.4 80.0 - 100.0 fL   MCH 29.7 26.0 - 34.0 pg   MCHC 33.7 30.0 - 36.0 g/dL   RDW 40.9 81.1 - 91.4 %   Platelets 209 150 - 400 K/uL   nRBC 0.0 0.0 - 0.2 %   Neutrophils Relative % 57 %   Neutro Abs 5.2 1.7 - 7.7 K/uL   Lymphocytes Relative 30 %   Lymphs Abs 2.6 0.7 - 4.0 K/uL   Monocytes Relative 10 %   Monocytes Absolute 0.8 0.1 - 1.0 K/uL   Eosinophils Relative 1 %   Eosinophils Absolute 0.1 0.0 - 0.5 K/uL   Basophils Relative 1 %   Basophils Absolute 0.1 0.0 - 0.1 K/uL   Immature Granulocytes 1 %   Abs Immature Granulocytes 0.04 0.00 - 0.07 K/uL    Comment: Performed at [facility], [address] [place], [place] [address]  Comprehensive metabolic panel     Status: Abnormal   Collection Time: 04/13/22  4:43 PM  Result Value Ref Range   Sodium 138 135 - 145 mmol/L   Potassium 3.9 3.5 - 5.1 mmol/L   Chloride 106 98 - 111 mmol/L   CO2 20 (L) 22 - 32 mmol/L   Glucose, Bld 98 70 - 99 mg/dL    Comment: Glucose reference range applies only to samples taken after fasting for at least 8 hours.   BUN 20 6 - 20 mg/dL   Creatinine, Ser 6.21 0.61 - 1.24 mg/dL   Calcium 9.7 8.9 - 30.8 mg/dL   Total Protein 7.8 6.5 - 8.1 g/dL   Albumin 4.1 3.5 - 5.0 g/dL   AST 33 15 - 41 U/L   ALT 21 0 - 44 U/L   Alkaline Phosphatase 57 38 - 126 U/L   Total Bilirubin 1.5 (H) 0.3 - 1.2 mg/dL   GFR, Estimated >65 >78 mL/min    Comment: (NOTE) Calculated using the CKD-EPI Creatinine Equation (2021)    Anion gap 12  5 - 15    Comment:  Performed at [facility], [address] [place], [place] [address]  Lipase, blood     Status: Abnormal   Collection Time: 04/13/22  4:43 PM  Result Value Ref Range   Lipase 56 (H) 11 - 51 U/L    Comment: Performed at [facility] [address], [place], [place] [address]  Urine Drug Screen, Qualitative ([facility] only)     Status: Abnormal   Collection Time: 04/13/22  7:15 PM  Result Value Ref Range   Tricyclic, Ur Screen NONE DETECTED NONE DETECTED   Amphetamines, Ur Screen NONE DETECTED NONE DETECTED   MDMA (Ecstasy)Ur Screen NONE DETECTED NONE DETECTED   Cocaine Metabolite,Ur [place] POSITIVE (A) NONE DETECTED   Opiate, Ur Screen POSITIVE (A) NONE DETECTED   Phencyclidine (PCP) Ur S NONE DETECTED NONE DETECTED   Cannabinoid 50 Ng, Ur [place] POSITIVE (A) NONE DETECTED   Barbiturates, Ur Screen NONE DETECTED NONE DETECTED   Benzodiazepine, Ur Scrn NONE DETECTED NONE DETECTED   Methadone Scn, Ur NONE DETECTED NONE DETECTED    Comment: (NOTE) Tricyclics + metabolites, urine    Cutoff 1000 ng/mL Amphetamines + metabolites, urine  Cutoff 1000 ng/mL MDMA (Ecstasy), urine              Cutoff 500 ng/mL Cocaine Metabolite, urine          Cutoff 300 ng/mL Opiate + metabolites, urine        Cutoff 300 ng/mL Phencyclidine (PCP), urine         Cutoff 25 ng/mL Cannabinoid, urine                 Cutoff 50 ng/mL Barbiturates + metabolites, urine  Cutoff 200 ng/mL Benzodiazepine, urine              Cutoff 200 ng/mL Methadone, urine                   Cutoff 300 ng/mL  The urine drug screen provides only a preliminary, unconfirmed analytical test result and should not be used for non-medical purposes. Clinical consideration and professional judgment should be applied to any positive drug screen result due to possible interfering substances. A more specific alternate chemical method must be used in order to obtain a confirmed analytical result. Gas chromatography / mass spectrometry  (GC/MS) is the preferred confirm atory method. Performed at [facility], [address] [place], [place] [address]   Urinalysis, Routine w reflex microscopic -Urine, Clean Catch     Status: Abnormal   Collection Time: 04/13/22  7:30 PM  Result Value Ref Range   Color, Urine STRAW (A) YELLOW   APPearance CLEAR (A) CLEAR   Specific Gravity, Urine 1.028 1.005 - 1.030   pH 6.0 5.0 - 8.0   Glucose, UA NEGATIVE NEGATIVE mg/dL   Hgb urine dipstick NEGATIVE NEGATIVE   Bilirubin Urine NEGATIVE NEGATIVE   Ketones, ur NEGATIVE NEGATIVE mg/dL   Protein, ur NEGATIVE NEGATIVE mg/dL   Nitrite NEGATIVE NEGATIVE   Leukocytes,Ua NEGATIVE NEGATIVE    Comment: Performed at [facility], [address], [place], [place] [address]  Acetaminophen level     Status: Abnormal   Collection Time: 04/13/22  7:38 PM  Result Value Ref Range   Acetaminophen (Tylenol), Serum <10 (L) 10 - 30 ug/mL    Comment: (NOTE) Therapeutic concentrations vary significantly. A range of 10-30 ug/mL  may be an effective concentration for many patients. However, some  are best treated at concentrations outside of this range. Acetaminophen concentrations >150 ug/mL at 4 hours after ingestion  and >50 ug/mL at 12 hours after ingestion are often associated with  toxic reactions.  Performed at [facility], [address] [place], [place] [address]   Salicylate level     Status: Abnormal   Collection Time: 04/13/22  7:38 PM  Result Value Ref Range   Salicylate Lvl <7.0 (L) 7.0 - 30.0 mg/dL    Comment: Performed at [facility], [address] [place], [place] [address]  Ethanol     Status: None   Collection Time: 04/13/22  7:38 PM  Result Value Ref Range   Alcohol, Ethyl (B) <10 <10 mg/dL    Comment: (NOTE) Lowest detectable limit for serum alcohol is 10 mg/dL.  For medical purposes only. Performed at [facility], [address] [place], [place] [address]   SARS  Coronavirus 2 by RT PCR (hospital order, performed in [facility] lab) *cepheid single result test* Anterior Nasal Swab     Status: None   Collection Time: 04/14/22 10:26 AM   Specimen: Anterior Nasal Swab  Result Value Ref Range   SARS Coronavirus 2 by RT PCR NEGATIVE NEGATIVE    Comment: (NOTE) SARS-CoV-2 target nucleic acids are NOT DETECTED.  The SARS-CoV-2 RNA is generally detectable in upper and lower respiratory specimens during the acute phase of infection. The lowest concentration of SARS-CoV-2 viral copies this assay can detect is 250 copies / mL. A negative result does not preclude SARS-CoV-2 infection and should not be used as the sole basis for treatment or other patient management decisions.  A negative result may occur with improper specimen collection / handling, submission of specimen other than nasopharyngeal swab, presence of viral mutation(s) within the areas targeted by this assay, and inadequate number of viral copies (<250 copies / mL). A negative result must be combined with clinical observations, patient history, and epidemiological information.  Fact Sheet for Patients:   RoadLapTop.co.zahttps://www.fda.gov/media/158405/download  Fact Sheet for Healthcare Providers: http://kim-miller.com/https://www.fda.gov/media/158404/download  This test is not yet approved or  cleared by the [place] FDA and has been authorized for detection and/or diagnosis of SARS-CoV-2 by FDA under an Emergency Use Authorization (EUA).  This EUA will remain in effect (meaning this test can be used) for the duration of the COVID-19 declaration under Section 564(b)(1) of the Act, 21 U.S.C. section 360bbb-3(b)(1), unless the authorization is terminated or revoked sooner.  Performed at [facility], [address] [place], [place] [address]     Blood Alcohol level:  Lab Results  Component Value Date   [facility] 04/13/2022   ETH <10 07/11/2021    Metabolic Disorder Labs:  Lab Results   Component Value Date   HGBA1C 5.3 01/18/2021   MPG 105.41 01/18/2021   MPG 116.89 01/21/2019   No results found for: "PROLACTIN" Lab Results  Component Value Date   CHOL 202 (H) 01/18/2021   TRIG 172 (H) 01/18/2021   HDL 40 (L) 01/18/2021   CHOLHDL 5.1 01/18/2021   VLDL 34 01/18/2021   LDLCALC 128 (H) 01/18/2021   LDLCALC 98 01/07/2016    Current Medications: Current Facility-Administered Medications  Medication Dose Route Frequency Provider Last Rate Last Admin   acetaminophen (TYLENOL) tablet 650 mg  650 mg Oral Q6H PRN Leevy-Johnson, Brooke A, NP   650 mg at 04/15/22 0832   alum & mag hydroxide-simeth (MAALOX/MYLANTA) 200-200-20 MG/5ML suspension 30 mL  30 mL  Oral Q4H PRN Leevy-Johnson, Brooke A, NP       diphenhydrAMINE (BENADRYL) capsule 50 mg  50 mg Oral TID PRN Leevy-Johnson, Brooke A, NP       Or   diphenhydrAMINE (BENADRYL) injection 50 mg  50 mg Intramuscular TID PRN Leevy-Johnson, Brooke A, NP       doxepin (SINEQUAN) capsule 50 mg  50 mg Oral QHS Leevy-Johnson, Brooke A, NP   50 mg at 04/14/22 2124   enalapril (VASOTEC) tablet 10 mg  10 mg Oral Daily Leevy-Johnson, Brooke A, NP   10 mg at 04/15/22 9147   feeding supplement (ENSURE ENLIVE / ENSURE PLUS) liquid 237 mL  237 mL Oral TID BM Phuoc Huy T, MD       haloperidol (HALDOL) tablet 5 mg  5 mg Oral TID PRN Leevy-Johnson, Brooke A, NP   5 mg at 04/14/22 2132   Or   haloperidol lactate (HALDOL) injection 5 mg  5 mg Intramuscular TID PRN Leevy-Johnson, Brooke A, NP       LORazepam (ATIVAN) tablet 2 mg  2 mg Oral TID PRN Leevy-Johnson, Brooke A, NP   2 mg at 04/14/22 2132   Or   LORazepam (ATIVAN) injection 2 mg  2 mg Intramuscular TID PRN Leevy-Johnson, Brooke A, NP       magnesium hydroxide (MILK OF MAGNESIA) suspension 30 mL  30 mL Oral Daily PRN Leevy-Johnson, Brooke A, NP       multivitamin with minerals tablet 1 tablet  1 tablet Oral Daily Caia Lofaro T, MD       traZODone (DESYREL) tablet 50 mg  50 mg Oral  QHS PRN Leevy-Johnson, Brooke A, NP   50 mg at 04/14/22 2124   PTA Medications: Medications Prior to Admission  Medication Sig Dispense Refill Last Dose   doxepin (SINEQUAN) 100 MG capsule Take 1 capsule (100 mg total) by mouth at bedtime. (Patient not taking: Reported on 04/14/2022) 30 capsule 3    enalapril (VASOTEC) 20 MG tablet Take 20 mg by mouth daily.      gabapentin (NEURONTIN) 300 MG capsule Take 300 mg by mouth 3 (three) times daily. (Patient not taking: Reported on 04/14/2022)      gabapentin (NEURONTIN) 400 MG capsule Take 1 capsule (400 mg total) by mouth 3 (three) times daily. (Patient not taking: Reported on 07/17/2021) 90 capsule 3    haloperidol (HALDOL) 5 MG tablet Take 5 mg by mouth 2 (two) times daily. (Patient not taking: Reported on 04/14/2022)      traMADol (ULTRAM) 50 MG tablet Take 50 mg by mouth every 6 (six) hours as needed.      traZODone (DESYREL) 50 MG tablet Take 0.5 tablets (25 mg total) by mouth 3 (three) times daily as needed (Anxiety). (Patient not taking: Reported on 04/14/2022) 90 tablet 3     Musculoskeletal: Strength & Muscle Tone: within normal limits Gait & Station: normal Patient leans: N/A            Psychiatric Specialty Exam:  Presentation  General Appearance:  Appropriate for Environment  Eye Contact: Fair  Speech: Clear and Coherent  Speech Volume: Decreased  Handedness: Right   Mood and Affect  Mood: Euthymic  Affect: Congruent   Thought Process  Thought Processes: Coherent  Duration of Psychotic Symptoms: Patient's report of hallucinations is chronic and occurs primarily with his mood symptoms Past Diagnosis of Schizophrenia or Psychoactive disorder: No  Descriptions of Associations:Intact  Orientation:Full (Time, Place and Person)  Thought Content:Logical  Hallucinations:No data recorded Ideas of Reference:None  Suicidal Thoughts:No data recorded Homicidal Thoughts:No data recorded  Sensorium   Memory: Immediate Good; Recent Good; Remote Good  Judgment: Fair  Insight: Fair   Art therapist  Concentration: Good  Attention Span: Good  Recall: Good  Fund of Knowledge: Good  Language: Good   Psychomotor Activity  Psychomotor Activity:No data recorded  Assets  Assets: Resilience; Physical Health   Sleep  Sleep:No data recorded   Physical Exam: Physical Exam Vitals and nursing note reviewed.  Constitutional:      Appearance: Normal appearance.  HENT:     Head: Normocephalic and atraumatic.     Mouth/Throat:     Pharynx: Oropharynx is clear.  Eyes:     Pupils: Pupils are equal, round, and reactive to light.  Cardiovascular:     Rate and Rhythm: Normal rate and regular rhythm.  Pulmonary:     Effort: Pulmonary effort is normal.     Breath sounds: Normal breath sounds.  Abdominal:     General: Abdomen is flat.     Palpations: Abdomen is soft.  Musculoskeletal:        General: Normal range of motion.  Skin:    General: Skin is warm and dry.  Neurological:     General: No focal deficit present.     Mental Status: He is alert. Mental status is at baseline.  Psychiatric:        Attention and Perception: Attention normal. He perceives auditory hallucinations.        Mood and Affect: Mood is anxious and depressed.        Speech: Speech normal.        Behavior: Behavior is cooperative.        Thought Content: Thought content includes suicidal ideation. Thought content does not include suicidal plan.        Cognition and Memory: Cognition normal.    Review of Systems  Constitutional: Negative.   HENT: Negative.    Eyes: Negative.   Respiratory: Negative.    Cardiovascular: Negative.   Gastrointestinal: Negative.   Musculoskeletal: Negative.   Skin: Negative.   Neurological: Negative.   Psychiatric/Behavioral:  Positive for depression, hallucinations, substance abuse and suicidal ideas. The patient is nervous/anxious and has insomnia.     Blood pressure 95/66, pulse 75, temperature 97.9 F (36.6 C), temperature source Oral, resp. rate 18, height 5\' 11"  (1.803 m), weight 65.8 kg, SpO2 98 %. Body mass index is 20.22 kg/m.  Treatment Plan Summary: Daily contact with patient to assess and evaluate symptoms and progress in treatment, Medication management, and Plan reviewed past medication.  Has been on several different combinations of antidepressants and antipsychotics.  I proposed going back to fluoxetine and olanzapine which appears to have been well-tolerated safe and effective.  No need for detox protocol.  Continue 15-minute check.  Fully evaluate labs.  Daily assessment of dangerousness and symptoms prior to discharge  Observation Level/Precautions:  15 minute checks  Laboratory:  Chemistry Profile  Psychotherapy:    Medications:    Consultations:    Discharge Concerns:    Estimated LOS:  Other:     Physician Treatment Plan for Primary Diagnosis: Major depressive disorder, recurrent severe without psychotic features Long Term Goal(s): Improvement in symptoms so as ready for discharge  Short Term Goals: Ability to disclose and discuss suicidal ideas  Physician Treatment Plan for Secondary Diagnosis: Principal Problem:   Major depressive disorder, recurrent severe without psychotic features Active Problems:   Essential  hypertension   Cocaine use  Long Term Goal(s): Improvement in symptoms so as ready for discharge  Short Term Goals: Compliance with prescribed medications will improve  I certify that inpatient services furnished can reasonably be expected to improve the patient's condition.    Mordecai Rasmussen, MD 4/5/20243:25 PM
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
## 2022-04-15 NOTE — BH IP Treatment Plan (Signed)
Interdisciplinary Treatment and Diagnostic Plan Update  04/15/2022 Time of Session: 08:49 [name] MRN: [id number]  Principal Diagnosis: <principal problem not specified>  Secondary Diagnoses: Active Problems:   Major depressive disorder, recurrent severe without psychotic features   Current Medications:  Current Facility-Administered Medications  Medication Dose Route Frequency Provider Last Rate Last Admin   acetaminophen (TYLENOL) tablet 650 mg  650 mg Oral Q6H PRN Leevy-Johnson, Brooke A, NP   650 mg at 04/15/22 0832   alum & mag hydroxide-simeth (MAALOX/MYLANTA) 200-200-20 MG/5ML suspension 30 mL  30 mL Oral Q4H PRN Leevy-Johnson, Brooke A, NP       diphenhydrAMINE (BENADRYL) capsule 50 mg  50 mg Oral TID PRN Leevy-Johnson, Brooke A, NP       Or   diphenhydrAMINE (BENADRYL) injection 50 mg  50 mg Intramuscular TID PRN Leevy-Johnson, Brooke A, NP       doxepin (SINEQUAN) capsule 50 mg  50 mg Oral QHS Leevy-Johnson, Brooke A, NP   50 mg at 04/14/22 2124   enalapril (VASOTEC) tablet 10 mg  10 mg Oral Daily Leevy-Johnson, Brooke A, NP   10 mg at 04/15/22 9892   feeding supplement (ENSURE ENLIVE / ENSURE PLUS) liquid 237 mL  237 mL Oral TID BM Clapacs, John T, MD       haloperidol (HALDOL) tablet 5 mg  5 mg Oral TID PRN Leevy-Johnson, Brooke A, NP   5 mg at 04/14/22 2132   Or   haloperidol lactate (HALDOL) injection 5 mg  5 mg Intramuscular TID PRN Leevy-Johnson, Brooke A, NP       LORazepam (ATIVAN) tablet 2 mg  2 mg Oral TID PRN Leevy-Johnson, Brooke A, NP   2 mg at 04/14/22 2132   Or   LORazepam (ATIVAN) injection 2 mg  2 mg Intramuscular TID PRN Leevy-Johnson, Brooke A, NP       magnesium hydroxide (MILK OF MAGNESIA) suspension 30 mL  30 mL Oral Daily PRN Leevy-Johnson, Brooke A, NP       multivitamin with minerals tablet 1 tablet  1 tablet Oral Daily Clapacs, John T, MD       traZODone (DESYREL) tablet 50 mg  50 mg Oral QHS PRN Leevy-Johnson, Brooke A, NP   50 mg at  04/14/22 2124   PTA Medications: Medications Prior to Admission  Medication Sig Dispense Refill Last Dose   doxepin (SINEQUAN) 100 MG capsule Take 1 capsule (100 mg total) by mouth at bedtime. (Patient not taking: Reported on 04/14/2022) 30 capsule 3    enalapril (VASOTEC) 20 MG tablet Take 20 mg by mouth daily.      gabapentin (NEURONTIN) 300 MG capsule Take 300 mg by mouth 3 (three) times daily. (Patient not taking: Reported on 04/14/2022)      gabapentin (NEURONTIN) 400 MG capsule Take 1 capsule (400 mg total) by mouth 3 (three) times daily. (Patient not taking: Reported on 07/17/2021) 90 capsule 3    haloperidol (HALDOL) 5 MG tablet Take 5 mg by mouth 2 (two) times daily. (Patient not taking: Reported on 04/14/2022)      traMADol (ULTRAM) 50 MG tablet Take 50 mg by mouth every 6 (six) hours as needed.      traZODone (DESYREL) 50 MG tablet Take 0.5 tablets (25 mg total) by mouth 3 (three) times daily as needed (Anxiety). (Patient not taking: Reported on 04/14/2022) 90 tablet 3     Patient Stressors: Financial difficulties   Medication change or noncompliance   Traumatic event  Patient Strengths: Capable of independent living  PrintmakerCommunication skills  Motivation for treatment/growth   Treatment Modalities: Medication Management, Group therapy, Case management,  1 to 1 session with clinician, Psychoeducation, Recreational therapy.   Physician Treatment Plan for Primary Diagnosis: <principal problem not specified> Long Term Goal(s):     Short Term Goals:    Medication Management: Evaluate patient's response, side effects, and tolerance of medication regimen.  Therapeutic Interventions: 1 to 1 sessions, Unit Group sessions and Medication administration.  Evaluation of Outcomes: Not Met  Physician Treatment Plan for Secondary Diagnosis: Active Problems:   Major depressive disorder, recurrent severe without psychotic features  Long Term Goal(s):     Short Term Goals:       Medication  Management: Evaluate patient's response, side effects, and tolerance of medication regimen.  Therapeutic Interventions: 1 to 1 sessions, Unit Group sessions and Medication administration.  Evaluation of Outcomes: Not Met   RN Treatment Plan for Primary Diagnosis: <principal problem not specified> Long Term Goal(s): Knowledge of disease and therapeutic regimen to maintain health will improve  Short Term Goals: Ability to remain free from injury will improve, Ability to verbalize frustration and anger appropriately will improve, Ability to demonstrate self-control, Ability to participate in decision making will improve, Ability to verbalize feelings will improve, Ability to disclose and discuss suicidal ideas, Ability to identify and develop effective coping behaviors will improve, and Compliance with prescribed medications will improve  Medication Management: RN will administer medications as ordered by provider, will assess and evaluate patient's response and provide education to patient for prescribed medication. RN will report any adverse and/or side effects to prescribing provider.  Therapeutic Interventions: 1 on 1 counseling sessions, Psychoeducation, Medication administration, Evaluate responses to treatment, Monitor vital signs and CBGs as ordered, Perform/monitor CIWA, COWS, AIMS and Fall Risk screenings as ordered, Perform wound care treatments as ordered.  Evaluation of Outcomes: Not Met   LCSW Treatment Plan for Primary Diagnosis: <principal problem not specified> Long Term Goal(s): Safe transition to appropriate next level of care at discharge, Engage patient in therapeutic group addressing interpersonal concerns.  Short Term Goals: Engage patient in aftercare planning with referrals and resources, Increase social support, Increase ability to appropriately verbalize feelings, Increase emotional regulation, Facilitate acceptance of mental health diagnosis and concerns, Facilitate  patient progression through stages of change regarding substance use diagnoses and concerns, Identify triggers associated with mental health/substance abuse issues, and Increase skills for wellness and recovery  Therapeutic Interventions: Assess for all discharge needs, 1 to 1 time with Social worker, Explore available resources and support systems, Assess for adequacy in community support network, Educate family and significant other(s) on suicide prevention, Complete Psychosocial Assessment, Interpersonal group therapy.  Evaluation of Outcomes: Not Met   Progress in Treatment: Attending groups: Yes. and No. Participating in groups: Yes. Taking medication as prescribed: Yes. Toleration medication: Yes. Family/Significant other contact made: No, will contact:  if given permission.  Patient understands diagnosis: Yes. Discussing patient identified problems/goals with staff: Yes. Medical problems stabilized or resolved: Yes. Denies suicidal/homicidal ideation: No. Issues/concerns per patient self-inventory: No. Other: none  New problem(s) identified: No, Describe:  none identified.  New Short Term/Long Term Goal(s): detox, elimination of symptoms of psychosis, medication management for mood stabilization; elimination of SI thoughts; development of comprehensive mental wellness/sobriety plan.   Patient Goals:  "I need to get back on medications, been off of them for two years, so I need to get them back."  Discharge Plan or Barriers: CSW will  assist pt with development of an appropriate aftercare/discharge plan.   Reason for Continuation of Hospitalization: Depression Hallucinations Medication stabilization Suicidal ideation  Estimated Length of Stay: 1-7 days  Last 3 [place] Suicide Severity Risk Score: Flowsheet Row Ad[facility] (Current) [facility]osp-Admission  (Discharged) from [facility]  C-SSRS RISK CATEGORY No Risk No Risk Low Risk       Last PHQ 2/9 Scores:     No data to display          Scribe for Treatment Team: Glenis Smokerarl R Milly Goggins, LCSW 04/15/2022 2:20 PM
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
## 2022-04-15 NOTE — Group Note (Signed)
Recreation Therapy Group Note   Group Topic:Leisure Education  Group Date: 04/15/2022 Start Time: 1000 End Time: 1110 Facilitators: Rosina Lowenstein, LRT, CTRS Location:  Craft Room  Group Description: Leisure. Patients were given the option to choose from painting, playing apples to apples, making origami, journaling, or sing karaoke/listen to music duration of session. LRT and pts discussed the importance of participating in leisure during their free time and when they're outside of the hospital. Pt identified two leisure interests and shared with the group  Affect/Mood: N/A   Participation Level: Did not attend    Clinical Observations/Individualized Feedback: [name] did not attend group due to resting in his room.  Plan: Continue to engage patient in RT group sessions 2-3x/week.   Rosina Lowenstein, LRT, CTRS 04/15/2022 12:08 PM

## 2022-04-15 NOTE — Progress Notes (Signed)
NUTRITION ASSESSMENT  Pt identified as at risk on the Malnutrition Screen Tool  INTERVENTION:  -Ensure Enlive po TID, each supplement provides 350 kcal and 20 grams of protein -MVI with minerals daily  NUTRITION DIAGNOSIS: Unintentional weight loss related to sub-optimal intake as evidenced by pt report.   Goal: Pt to meet >/= 90% of their estimated nutrition needs.  Monitor:  PO intake  Assessment:   62 y.o. male  Pt admitted with hip pain, auditory hallucinations, and visual hallucinations.   Pt on a regular diet. No meal completion data available at this time.   Noted that pt was homeless until 3 months ago when he started living with a friend. He endorses poor sleep, poor appetite, and weight loss. He admits to smoking marijuana. He has not been taking medications due to lapse on his Medicaid.   Reviewed wt hx; pt has experienced a 14.6% wt loss over the past 9 months. While this is not significant for time frame, it is concerning given multiple co-morbidities. Pt is at high risk for malnutrition and would greatly beenfit from addition of oral nutrition supplements.   Medications reviewed.   Labs reviewed: CBGS: 123.    Height: Ht Readings from Last 1 Encounters:  04/14/22 5\' 11"  (1.803 m)    Weight: Wt Readings from Last 1 Encounters:  04/14/22 65.8 kg    Weight Hx: Wt Readings from Last 10 Encounters:  04/14/22 65.8 kg  07/17/21 77.1 kg  07/11/21 77.1 kg  06/27/21 77.1 kg  06/25/21 79.4 kg  06/18/21 80.7 kg  06/18/21 82 kg  05/29/21 81.6 kg  05/28/21 81.6 kg  05/27/21 81.6 kg    BMI:  Body mass index is 20.22 kg/m. BMI WDL.   Estimated Nutritional Needs: Kcal: 25-30 kcal/kg Protein: > 1 gram protein/kg Fluid: 1 ml/kcal  Diet Order:  Diet Order             Diet regular Room service appropriate? Yes; Fluid consistency: Thin  Diet effective now                  Pt is also offered choice of unit snacks mid-morning and mid-afternoon.   Pt is eating as desired.   Lab results and medications reviewed.   Levada Schilling, RD, LDN, CDCES Registered Dietitian II Certified Diabetes Care and Education Specialist Please refer to [facility] for RD and/or RD on-call/weekend/after hours pager

## 2022-04-15 NOTE — Plan of Care (Signed)
D: Patient alert and oriented. Patient rates pain 7/10 for left shoulder pain. Patient endorses anxiety and depression. Patient denies SI/HI.  Patient endorses AVH stating he sees and hears his brothers that passed away.  A: Scheduled medications administered to patient, per MD orders.  Support and encouragement provided to patient.  Q15 minute safety checks maintained.   R: Patient compliant with medication administration and treatment plan. No adverse drug reactions noted. Patient remains safe on the unit at this time. Problem: Education: Goal: Knowledge of General Education information will improve Description: Including pain rating scale, medication(s)/side effects and non-pharmacologic comfort measures Outcome: Progressing   Problem: Clinical Measurements: Goal: Ability to maintain clinical measurements within normal limits will improve Outcome: Progressing   Problem: Medication: Goal: Compliance with prescribed medication regimen will improve Outcome: Progressing

## 2022-04-15 NOTE — Group Note (Signed)
Date:  04/15/2022 Time:  6:36 PM  Group Topic/Focus:  Activity Group    Participation Level:  Did Not Attend   Lynelle Smoke [facility] 04/15/2022, 6:36 PM

## 2022-04-15 NOTE — [facility] Suicide Risk Assessment (Signed)
[facility] INPATIENT:  Family/Significant Other Suicide Prevention Education  Suicide Prevention Education:  Patient Refusal for Family/Significant Other Suicide Prevention Education: The patient [name] has refused to provide written consent for family/significant other to be provided Family/Significant Other Suicide Prevention Education during admission and/or prior to discharge.  Physician notified.  SPE completed with pt, as pt refused to consent to family contact. SPI pamphlet provided to pt and pt was encouraged to share information with support network, ask questions, and talk about any concerns relating to SPE. Pt denies access to guns/firearms and verbalized understanding of information provided. Mobile Crisis information also provided to pt.    Harden Mo 04/15/2022, 4:05 PM

## 2022-04-15 NOTE — Progress Notes (Signed)
Patient calm and pleasant during assessment endorsing AVH, denies SI/HI. Patient stated he was here because of hearing voices. Pt observed interacting appropriately with staff and peers on the unit. Pt compliant with medication administration per MD orders. Pt given education, support, and encouragement to be active in his treatment plan. Pt being monitored Q 15 minutes for safety per unit protocol, remains safe on the unit

## 2022-04-15 NOTE — Group Note (Signed)
Date:  04/15/2022 Time:  5:55 PM  Group Topic/Focus:  Goals Group:   The focus of this group is to help patients establish daily goals to achieve during treatment and discuss how the patient can incorporate goal setting into their daily lives to aide in recovery.  Music Therapy   Participation Level:  Did Not Attend  Aiman Sonn A Markavious Micco 04/15/2022, 5:55 PM

## 2022-04-15 NOTE — Progress Notes (Signed)
Reports continued struggle with avh, but trying his best to deal with his symptoms. Also endorses high level of anxiety and depression, beyond the 0-10 scale when questioned.  Med compliant, received scheduled and PRNs to help cope. Reports having an overall decent day despite his struggles.  Safe on the unit with q15 safety checks.  Encouraged him to seek staff with any questions or concerns he may.     C Butler-Nicholson, LPN

## 2022-04-16 DIAGNOSIS — F332 Major depressive disorder, recurrent severe without psychotic features: Secondary | ICD-10-CM | POA: Diagnosis not present

## 2022-04-16 LAB — LIPID PANEL
Cholesterol: 184 mg/dL (ref 0–200)
HDL: 32 mg/dL — ABNORMAL LOW (ref >40)
LDL Cholesterol: 125 mg/dL — ABNORMAL HIGH (ref 0–99)
Total CHOL/HDL Ratio: 5.8 RATIO
Triglycerides: 133 mg/dL (ref <150)
VLDL: 27 mg/dL (ref 0–40)

## 2022-04-16 MED ORDER — DOXEPIN HCL 100 MG PO CAPS
100.0000 mg | ORAL_CAPSULE | Freq: Every day | ORAL | Status: DC
  Administered 2022-04-16 – 2022-04-17 (×2): 100 mg via ORAL
  Filled 2022-04-16 (×2): qty 1

## 2022-04-16 NOTE — Plan of Care (Signed)
  Problem: Education: Goal: Knowledge of General Education information will improve Description: Including pain rating scale, medication(s)/side effects and non-pharmacologic comfort measures Outcome: Not Progressing   Problem: Health Behavior/Discharge Planning: Goal: Ability to manage health-related needs will improve Outcome: Not Progressing   Problem: Clinical Measurements: Goal: Ability to maintain clinical measurements within normal limits will improve Outcome: Not Progressing Goal: Will remain free from infection Outcome: Not Progressing Goal: Diagnostic test results will improve Outcome: Not Progressing Goal: Respiratory complications will improve Outcome: Not Progressing Goal: Cardiovascular complication will be avoided Outcome: Not Progressing   Problem: Activity: Goal: Risk for activity intolerance will decrease Outcome: Not Progressing   Problem: Nutrition: Goal: Adequate nutrition will be maintained Outcome: Not Progressing   Problem: Coping: Goal: Level of anxiety will decrease Outcome: Not Progressing   Problem: Elimination: Goal: Will not experience complications related to bowel motility Outcome: Not Progressing Goal: Will not experience complications related to urinary retention Outcome: Not Progressing   Problem: Pain Managment: Goal: General experience of comfort will improve Outcome: Not Progressing   Problem: Safety: Goal: Ability to remain free from injury will improve Outcome: Not Progressing   Problem: Skin Integrity: Goal: Risk for impaired skin integrity will decrease Outcome: Not Progressing   Problem: Education: Goal: Ability to make informed decisions regarding treatment will improve Outcome: Not Progressing   Problem: Coping: Goal: Coping ability will improve Outcome: Not Progressing   Problem: Health Behavior/Discharge Planning: Goal: Identification of resources available to assist in meeting health care needs will  improve Outcome: Not Progressing   Problem: Medication: Goal: Compliance with prescribed medication regimen will improve Outcome: Not Progressing   Problem: Self-Concept: Goal: Ability to disclose and discuss suicidal ideas will improve Outcome: Not Progressing Goal: Will verbalize positive feelings about self Outcome: Not Progressing   Problem: Activity: Goal: Will verbalize the importance of balancing activity with adequate rest periods Outcome: Not Progressing   Problem: Education: Goal: Will be free of psychotic symptoms Outcome: Not Progressing Goal: Knowledge of the prescribed therapeutic regimen will improve Outcome: Not Progressing   Problem: Coping: Goal: Coping ability will improve Outcome: Not Progressing Goal: Will verbalize feelings Outcome: Not Progressing   Problem: Health Behavior/Discharge Planning: Goal: Compliance with prescribed medication regimen will improve Outcome: Not Progressing   Problem: Nutritional: Goal: Ability to achieve adequate nutritional intake will improve Outcome: Not Progressing   Problem: Role Relationship: Goal: Ability to communicate needs accurately will improve Outcome: Not Progressing Goal: Ability to interact with others will improve Outcome: Not Progressing   Problem: Safety: Goal: Ability to redirect hostility and anger into socially appropriate behaviors will improve Outcome: Not Progressing Goal: Ability to remain free from injury will improve Outcome: Not Progressing   Problem: Self-Care: Goal: Ability to participate in self-care as condition permits will improve Outcome: Not Progressing   Problem: Self-Concept: Goal: Will verbalize positive feelings about self Outcome: Not Progressing

## 2022-04-16 NOTE — Progress Notes (Signed)
D- Patient alert and oriented x 4. Affect irritable/mood irritabled. Denies SI/ HI/ AVH. He complains of chronic shoulder and hip pain 8/10-Tylenol admin PRN 5/10. A- Scheduled medications administered to patient, per MD orders. Support and encouragement provided.  Routine safety checks conducted every 15 minutes.  Patient informed to notify staff with problems or concerns. R- No adverse drug reactions noted. Patient compliant with medications and treatment plan. Patient receptive, calm, and cooperative and interacts well with others on the unit. He contracts for safety and remains safe on the unit at this time.

## 2022-04-16 NOTE — Group Note (Signed)
Date:  04/16/2022 Time:  6:25 PM  Group Topic/Focus:  [employer], Goals Group, Activity- outside/courtyard    Participation Level:  Did Not Attend   Lynelle Smoke [facility] 04/16/2022, 6:25 PM

## 2022-04-16 NOTE — Progress Notes (Signed)
[facility] MD Progress Note  04/16/2022 11:00 AM [name]  MRN:  [id number] Subjective: [name] is seen on rounds.  Nurses report that he has been more irritable today.  He tells me that he cannot sleep at night.  He is on trazodone and doxepin.  He may go up on his doxepin and see if that helps.  Nurses otherwise report no issues.  He denies any suicidal ideation. Principal Problem: Major depressive disorder, recurrent severe without psychotic features Diagnosis: Principal Problem:   Major depressive disorder, recurrent severe without psychotic features Active Problems:   Essential hypertension   Cocaine use  Total Time spent with patient: 15 minutes  Past Psychiatric History: Depression and substance abuse  Past Medical History:  Past Medical History:  Diagnosis Date   Anxiety    Arthritis    knees and hands   Bipolar disorder    Depression    GERD (gastroesophageal reflux disease)    Hepatitis    HEP "C"   History of kidney stones    Hypertension    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 echo 5/17: EF 60-65%, no RWMA, LV dias fxn nl, LA mildly dilated, RV sys fxn nl, PASP nl, moderate sized circumferential pericardial effusion was identified, 2.12 cm around the LV free wall, <1 cm around the RV free wall. Features were not c/w tamponade physiology   PTSD (post-traumatic stress disorder)    Witnessed brother's suicide.   Restless leg syndrome    Seizures    Syncope     Past Surgical History:  Procedure Laterality Date   CYSTOSCOPY WITH URETEROSCOPY AND STENT PLACEMENT     ESOPHAGOGASTRODUODENOSCOPY N/A 01/11/2016   Procedure: ESOPHAGOGASTRODUODENOSCOPY (EGD);  Surgeon: Charlott Rakes, MD;  Location: [facility] ENDOSCOPY;  Service: Endoscopy;  Laterality: N/A;   ESOPHAGOGASTRODUODENOSCOPY N/A 04/09/2020   Procedure: ESOPHAGOGASTRODUODENOSCOPY (EGD);  Surgeon: [name], MD;  Location: [facility] ENDOSCOPY;  Service:  Gastroenterology;  Laterality: N/A;   INCISION AND DRAINAGE ABSCESS Left 01/02/2018   Procedure: INCISION AND DRAINAGE LEFT KNEE;  Surgeon: Deeann Saint, MD;  Location: [facility] ORS;  Service: Orthopedics;  Laterality: Left;   JOINT REPLACEMENT Right    TKR   KNEE ARTHROSCOPY Right 06/25/2014   Procedure: ARTHROSCOPY KNEE;  Surgeon: Deeann Saint, MD;  Location: [facility] ORS;  Service: Orthopedics;  Laterality: Right;  partial arthroscopic medial menisectomy   LAPAROSCOPIC APPENDECTOMY N/A 06/02/2021   Procedure: APPENDECTOMY LAPAROSCOPIC;  Surgeon: Campbell Lerner, MD;  Location: [facility] ORS;  Service: General;  Laterality: N/A;   TOTAL KNEE ARTHROPLASTY Right 04/22/2015   Procedure: TOTAL KNEE ARTHROPLASTY;  Surgeon: Deeann Saint, MD;  Location: [facility] ORS;  Service: Orthopedics;  Laterality: Right;   TOTAL KNEE ARTHROPLASTY Left 10/30/2017   Procedure: TOTAL KNEE ARTHROPLASTY;  Surgeon: Deeann Saint, MD;  Location: [facility] ORS;  Service: Orthopedics;  Laterality: Left;   TOTAL KNEE REVISION Left 01/02/2018   Procedure: poly exchange of tibia and patella left knee;  Surgeon: Deeann Saint, MD;  Location: [facility] ORS;  Service: Orthopedics;  Laterality: Left;   UMBILICAL HERNIA REPAIR  06/02/2021   Procedure: HERNIA REPAIR UMBILICAL ADULT;  Surgeon: Campbell Lerner, MD;  Location: [facility] ORS;  Service: General;;   Family History:  Family History  Problem Relation Age of Onset   CVA Mother        deceased at age 67   Depression Brother        Died by suicide at  age 36   Family Psychiatric  History: Unremarkable Social History:  Social History   Substance and Sexual Activity  Alcohol Use Not Currently   Comment: rare     Social History   Substance and Sexual Activity  Drug Use Yes   Types: Marijuana, Cocaine    Social History   Socioeconomic History   Marital status: Single    Spouse name: Not on file   Number of children: Not on file   Years of education: Not on file   Highest education  level: Not on file  Occupational History   Not on file  Tobacco Use   Smoking status: Former    Packs/day: 0.75    Years: 20.00    Additional pack years: 0.00    Total pack years: 15.00    Types: Cigarettes    Quit date: 05/16/1984    Years since quitting: 37.9   Smokeless tobacco: Never  Vaping Use   Vaping Use: Never used  Substance and Sexual Activity   Alcohol use: Not Currently    Comment: rare   Drug use: Yes    Types: Marijuana, Cocaine   Sexual activity: Not on file  Other Topics Concern   Not on file  Social History Narrative   Not on file   Social Determinants of Health   Financial Resource Strain: Not on file  Food Insecurity: Food Insecurity Present (04/14/2022)   Hunger Vital Sign    Worried About Running Out of Food in the Last Year: Sometimes true    Ran Out of Food in the Last Year: Never true  Transportation Needs: No Transportation Needs (04/14/2022)   PRAPARE - Administrator, Civil Service (Medical): No    Lack of Transportation (Non-Medical): No  Physical Activity: Not on file  Stress: Not on file  Social Connections: Not on file   Additional Social History:                         Sleep: Poor  Appetite:  Fair  Current Medications: Current Facility-Administered Medications  Medication Dose Route Frequency Provider Last Rate Last Admin   acetaminophen (TYLENOL) tablet 650 mg  650 mg Oral Q6H PRN Leevy-Johnson, Brooke A, NP   650 mg at 04/15/22 2028   alum & mag hydroxide-simeth (MAALOX/MYLANTA) 200-200-20 MG/5ML suspension 30 mL  30 mL Oral Q4H PRN Leevy-Johnson, Brooke A, NP       diphenhydrAMINE (BENADRYL) capsule 50 mg  50 mg Oral TID PRN Leevy-Johnson, Brooke A, NP       Or   diphenhydrAMINE (BENADRYL) injection 50 mg  50 mg Intramuscular TID PRN Leevy-Johnson, Brooke A, NP       doxepin (SINEQUAN) capsule 50 mg  50 mg Oral QHS Leevy-Johnson, Brooke A, NP   50 mg at 04/15/22 2110   enalapril (VASOTEC) tablet 10 mg  10 mg  Oral Daily Leevy-Johnson, Brooke A, NP   10 mg at 04/16/22 0749   feeding supplement (ENSURE ENLIVE / ENSURE PLUS) liquid 237 mL  237 mL Oral TID BM Clapacs, John T, MD   237 mL at 04/16/22 1002   feeding supplement (ENSURE ENLIVE / ENSURE PLUS) liquid 237 mL  237 mL Oral BID BM Clapacs, John T, MD   237 mL at 04/16/22 1002   FLUoxetine (PROZAC) capsule 20 mg  20 mg Oral Daily Clapacs, John T, MD   20 mg at 04/16/22 0749   haloperidol (HALDOL) tablet 5  mg  5 mg Oral TID PRN Maxie BarbLeevy-Johnson, Brooke A, NP   5 mg at 04/14/22 2132   Or   haloperidol lactate (HALDOL) injection 5 mg  5 mg Intramuscular TID PRN Leevy-Johnson, Brooke A, NP       LORazepam (ATIVAN) tablet 2 mg  2 mg Oral TID PRN Leevy-Johnson, Brooke A, NP   2 mg at 04/14/22 2132   Or   LORazepam (ATIVAN) injection 2 mg  2 mg Intramuscular TID PRN Leevy-Johnson, Brooke A, NP       magnesium hydroxide (MILK OF MAGNESIA) suspension 30 mL  30 mL Oral Daily PRN Leevy-Johnson, Brooke A, NP       multivitamin with minerals tablet 1 tablet  1 tablet Oral Daily Clapacs, Jackquline DenmarkJohn T, MD   1 tablet at 04/16/22 0749   OLANZapine (ZYPREXA) tablet 10 mg  10 mg Oral QHS Clapacs, John T, MD   10 mg at 04/15/22 2110   traZODone (DESYREL) tablet 50 mg  50 mg Oral QHS PRN Leevy-Johnson, Lina SarBrooke A, NP   50 mg at 04/15/22 2110    Lab Results:  Results for orders placed or performed during the hospital encounter of 04/14/22 (from the past 48 hour(s))  Lipid panel     Status: Abnormal   Collection Time: 04/16/22  7:03 AM  Result Value Ref Range   Cholesterol 184 0 - 200 mg/dL   Triglycerides 811133 <914<150 mg/dL   HDL 32 (L) >78>40 mg/dL   Total CHOL/HDL Ratio 5.8 RATIO   VLDL 27 0 - 40 mg/dL   LDL Cholesterol 295125 (H) 0 - 99 mg/dL    Comment:        Total Cholesterol/HDL:CHD Risk Coronary Heart Disease Risk Table                     Men   Women  1/2 Average Risk   3.4   3.3  Average Risk       5.0   4.4  2 X Average Risk   9.6   7.1  3 X Average Risk  23.4    11.0        Use the calculated Patient Ratio above and the CHD Risk Table to determine the patient's CHD Risk.        ATP III CLASSIFICATION (LDL):  <100     mg/dL   Optimal  [id number]  mg/dL   Near or Above                    Optimal  130-159  mg/dL   Borderline  [id number]  mg/dL   High  >962>190     mg/dL   Very High Performed at [facility], [address] [place], [place] [id number]     Blood Alcohol level:  Lab Results  Component Value Date   [facility] University[facility] <10 04/13/2022   ETH <10 07/11/2021    Metabolic Disorder Labs: Lab Results  Component Value Date   HGBA1C 5.3 01/18/2021   MPG 105.41 01/18/2021   MPG 116.89 01/21/2019   No results found for: "PROLACTIN" Lab Results  Component Value Date   CHOL 184 04/16/2022   TRIG 133 04/16/2022   HDL 32 (L) 04/16/2022   CHOLHDL 5.8 04/16/2022   VLDL 27 04/16/2022   LDLCALC 125 (H) 04/16/2022   LDLCALC 128 (H) 01/18/2021    Physical Findings: AIMS:  , ,  ,  ,    CIWA:    COWS:  Musculoskeletal: Strength & Muscle Tone: within normal limits Gait & Station: normal Patient leans: N/A  Psychiatric Specialty Exam:  Presentation  General Appearance:  Appropriate for Environment  Eye Contact: Fair  Speech: Clear and Coherent  Speech Volume: Decreased  Handedness: Right   Mood and Affect  Mood: Euthymic  Affect: Congruent   Thought Process  Thought Processes: Coherent  Descriptions of Associations:Intact  Orientation:Full (Time, Place and Person)  Thought Content:Logical  History of Schizophrenia/Schizoaffective disorder:No  Duration of Psychotic Symptoms:Greater than six months  Hallucinations:No data recorded Ideas of Reference:None  Suicidal Thoughts:No data recorded Homicidal Thoughts:No data recorded  Sensorium  Memory: Immediate Good; Recent Good; Remote Good  Judgment: Fair  Insight: Fair   Art therapist  Concentration: Good  Attention  Span: Good  Recall: Good  Fund of Knowledge: Good  Language: Good   Psychomotor Activity  Psychomotor Activity:No data recorded  Assets  Assets: Resilience; Physical Health   Sleep  Sleep:No data recorded    Blood pressure (!) 134/97, pulse 81, temperature 98 F (36.7 C), temperature source Oral, resp. rate 18, height 5\' 11"  (1.803 m), weight 65.8 kg, SpO2 96 %. Body mass index is 20.22 kg/m.   Treatment Plan Summary: Daily contact with patient to assess and evaluate symptoms and progress in treatment, Medication management, and Plan increase doxepin to 100 mg at bedtime.  Brooke Payes [place], DO 04/16/2022, 11:00 AM

## 2022-04-17 DIAGNOSIS — F332 Major depressive disorder, recurrent severe without psychotic features: Secondary | ICD-10-CM | POA: Diagnosis not present

## 2022-04-17 MED ORDER — WHITE PETROLATUM EX OINT
TOPICAL_OINTMENT | CUTANEOUS | Status: DC | PRN
  Filled 2022-04-17: qty 5

## 2022-04-17 MED ORDER — ENALAPRIL MALEATE 10 MG PO TABS
10.0000 mg | ORAL_TABLET | Freq: Once | ORAL | Status: AC
  Administered 2022-04-17: 10 mg via ORAL
  Filled 2022-04-17: qty 1

## 2022-04-17 MED ORDER — ENALAPRIL MALEATE 10 MG PO TABS
20.0000 mg | ORAL_TABLET | Freq: Every day | ORAL | Status: DC
  Administered 2022-04-18: 20 mg via ORAL
  Filled 2022-04-17: qty 2
  Filled 2022-04-17: qty 1

## 2022-04-17 NOTE — Progress Notes (Signed)
[facility] MD Progress Note  04/17/2022 11:43 AM [name]  MRN:  [id number] Subjective: Narada is seen on rounds.  He has no complaints.  He states he is feeling better.  Nurses report no issues.  No side effects from his medication. 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 echo 5/17: EF 60-65%, no RWMA, LV dias fxn nl, LA mildly dilated, RV sys fxn nl, PASP nl, moderate sized circumferential pericardial effusion was identified, 2.12 cm around the LV free wall, <1 cm around the RV free wall. Features were not c/w tamponade physiology   PTSD (post-traumatic stress disorder)    Witnessed brother's suicide.   Restless leg syndrome    Seizures    Syncope     Past Surgical History:  Procedure Laterality Date   CYSTOSCOPY WITH URETEROSCOPY AND STENT PLACEMENT     ESOPHAGOGASTRODUODENOSCOPY N/A 01/11/2016   Procedure: ESOPHAGOGASTRODUODENOSCOPY (EGD);  Surgeon: Charlott Rakes, MD;  Location: [facility] ENDOSCOPY;  Service: Endoscopy;  Laterality: N/A;   ESOPHAGOGASTRODUODENOSCOPY N/A 04/09/2020   Procedure: ESOPHAGOGASTRODUODENOSCOPY (EGD);  Surgeon: [name], MD;  Location: [facility] ENDOSCOPY;  Service: Gastroenterology;  Laterality: N/A;   INCISION AND DRAINAGE ABSCESS Left 01/02/2018   Procedure: INCISION AND DRAINAGE LEFT KNEE;  Surgeon: Deeann Saint, MD;  Location: [facility] ORS;  Service: Orthopedics;  Laterality: Left;   JOINT REPLACEMENT Right    TKR   KNEE ARTHROSCOPY Right 06/25/2014   Procedure: ARTHROSCOPY KNEE;  Surgeon: Deeann Saint, MD;  Location: [facility] ORS;  Service: Orthopedics;  Laterality: Right;  partial arthroscopic medial menisectomy   LAPAROSCOPIC APPENDECTOMY N/A 06/02/2021   Procedure: APPENDECTOMY LAPAROSCOPIC;  Surgeon: Campbell Lerner, MD;  Location: [facility] ORS;  Service: General;  Laterality: N/A;   TOTAL KNEE ARTHROPLASTY Right 04/22/2015   Procedure: TOTAL KNEE ARTHROPLASTY;  Surgeon: Deeann Saint, MD;  Location: [facility] ORS;  Service: Orthopedics;  Laterality: Right;   TOTAL KNEE ARTHROPLASTY Left 10/30/2017   Procedure: TOTAL KNEE ARTHROPLASTY;  Surgeon: Deeann Saint, MD;  Location: [facility] ORS;  Service: Orthopedics;  Laterality: Left;   TOTAL KNEE REVISION Left 01/02/2018   Procedure: poly exchange of tibia and patella left knee;  Surgeon: Deeann Saint, MD;  Location: [facility] ORS;  Service: Orthopedics;  Laterality: Left;   UMBILICAL HERNIA REPAIR  06/02/2021   Procedure: HERNIA REPAIR UMBILICAL ADULT;  Surgeon: Campbell Lerner, MD;  Location: [facility] ORS;  Service: General;;   Family History:  Family History  Problem Relation Age of Onset   CVA Mother        deceased at age 39   Depression Brother        Died by suicide at age 17   Family Psychiatric  History: Unremarkable Social History:  Social History   Substance and Sexual Activity  Alcohol Use Not Currently   Comment: rare  Social History   Substance and Sexual Activity  Drug Use Yes   Types: Marijuana, Cocaine    Social History   Socioeconomic History   Marital status: Single    Spouse name: Not on file   Number of children: Not on file   Years of education: Not on file   Highest education level: Not on file  Occupational History   Not on file  Tobacco Use   Smoking status: Former    Packs/day: 0.75    Years: 20.00    Additional  pack years: 0.00    Total pack years: 15.00    Types: Cigarettes    Quit date: 05/16/1984    Years since quitting: 37.9   Smokeless tobacco: Never  Vaping Use   Vaping Use: Never used  Substance and Sexual Activity   Alcohol use: Not Currently    Comment: rare   Drug use: Yes    Types: Marijuana, Cocaine   Sexual activity: Not on file  Other Topics Concern   Not on file  Social History Narrative   Not on file   Social Determinants of Health   Financial Resource Strain: Not on file  Food Insecurity: Food Insecurity Present (04/14/2022)   Hunger Vital Sign    Worried About Running Out of Food in the Last Year: Sometimes true    Ran Out of Food in the Last Year: Never true  Transportation Needs: No Transportation Needs (04/14/2022)   PRAPARE - Administrator, Civil ServiceTransportation    Lack of Transportation (Medical): No    Lack of Transportation (Non-Medical): No  Physical Activity: Not on file  Stress: Not on file  Social Connections: Not on file   Additional Social History:                         Sleep: Good  Appetite:  Good  Current Medications: Current Facility-Administered Medications  Medication Dose Route Frequency Provider Last Rate Last Admin   acetaminophen (TYLENOL) tablet 650 mg  650 mg Oral Q6H PRN Leevy-Johnson, Brooke A, NP   650 mg at 04/16/22 1705   alum & mag hydroxide-simeth (MAALOX/MYLANTA) 200-200-20 MG/5ML suspension 30 mL  30 mL Oral Q4H PRN Leevy-Johnson, Brooke A, NP       diphenhydrAMINE (BENADRYL) capsule 50 mg  50 mg Oral TID PRN Leevy-Johnson, Brooke A, NP       Or   diphenhydrAMINE (BENADRYL) injection 50 mg  50 mg Intramuscular TID PRN Leevy-Johnson, Brooke A, NP       doxepin (SINEQUAN) capsule 100 mg  100 mg Oral QHS Sarina IllHerrick, Kalijah Zeiss Edward, DO   100 mg at 04/16/22 2130   enalapril (VASOTEC) tablet 10 mg  10 mg Oral Daily Leevy-Johnson, Brooke A, NP   10 mg at 04/17/22 0847   feeding supplement (ENSURE ENLIVE / ENSURE PLUS) liquid 237 mL  237 mL Oral TID  BM Clapacs, John T, MD   237 mL at 04/16/22 2100   feeding supplement (ENSURE ENLIVE / ENSURE PLUS) liquid 237 mL  237 mL Oral BID BM Clapacs, John T, MD   237 mL at 04/17/22 0847   FLUoxetine (PROZAC) capsule 20 mg  20 mg Oral Daily Clapacs, John T, MD   20 mg at 04/17/22 0847   haloperidol (HALDOL) tablet 5 mg  5 mg Oral TID PRN Leevy-Johnson, Brooke A, NP   5 mg at 04/16/22 1709   Or   haloperidol lactate (HALDOL) injection 5 mg  5 mg Intramuscular  TID PRN Leevy-Johnson, Brooke A, NP       LORazepam (ATIVAN) tablet 2 mg  2 mg Oral TID PRN Leevy-Johnson, Brooke A, NP   2 mg at 04/14/22 2132   Or   LORazepam (ATIVAN) injection 2 mg  2 mg Intramuscular TID PRN Leevy-Johnson, Brooke A, NP       magnesium hydroxide (MILK OF MAGNESIA) suspension 30 mL  30 mL Oral Daily PRN Leevy-Johnson, Brooke A, NP       multivitamin with minerals tablet 1 tablet  1 tablet Oral Daily Clapacs, Jackquline Denmark, MD   1 tablet at 04/17/22 0846   OLANZapine (ZYPREXA) tablet 10 mg  10 mg Oral QHS Clapacs, John T, MD   10 mg at 04/16/22 2130   traZODone (DESYREL) tablet 50 mg  50 mg Oral QHS PRN Leevy-Johnson, Brooke A, NP   50 mg at 04/15/22 2110   white petrolatum (VASELINE) gel   Topical PRN Sarina Ill, DO        Lab Results:  Results for orders placed or performed during the hospital encounter of 04/14/22 (from the past 48 hour(s))  Lipid panel     Status: Abnormal   Collection Time: 04/16/22  7:03 AM  Result Value Ref Range   Cholesterol 184 0 - 200 mg/dL   Triglycerides 161 <096 mg/dL   HDL 32 (L) >04 mg/dL   Total CHOL/HDL Ratio 5.8 RATIO   VLDL 27 0 - 40 mg/dL   LDL Cholesterol 540 (H) 0 - 99 mg/dL    Comment:        Total Cholesterol/HDL:CHD Risk Coronary Heart Disease Risk Table                     Men   Women  1/2 Average Risk   3.4   3.3  Average Risk       5.0   4.4  2 X Average Risk   9.6   7.1  3 X Average Risk  23.4   11.0        Use the calculated Patient Ratio above and the CHD Risk  Table to determine the patient's CHD Risk.        ATP III CLASSIFICATION (LDL):  <100     mg/dL   Optimal  981-191  mg/dL   Near or Above                    Optimal  130-159  mg/dL   Borderline  478-295  mg/dL   High  >621     mg/dL   Very High Performed at [facility], [address] [place], [place] [address]     Blood Alcohol level:  Lab Results  Component Value Date   [facility] <10 04/13/2022   ETH <10 07/11/2021    Metabolic Disorder Labs: Lab Results  Component Value Date   HGBA1C 5.3 01/18/2021   MPG 105.41 01/18/2021   MPG 116.89 01/21/2019   No results found for: "PROLACTIN" Lab Results  Component Value Date   CHOL 184 04/16/2022   TRIG 133 04/16/2022   HDL 32 (L) 04/16/2022   CHOLHDL 5.8 04/16/2022   VLDL 27 04/16/2022   LDLCALC 125 (H) 04/16/2022   LDLCALC 128 (H) 01/18/2021    Physical Findings: AIMS:  , ,  ,  ,    CIWA:    COWS:     Musculoskeletal: Strength & Muscle Tone: within normal limits Gait & Station: normal  Patient leans: N/A  Psychiatric Specialty Exam:  Presentation  General Appearance:  Appropriate for Environment  Eye Contact: Fair  Speech: Clear and Coherent  Speech Volume: Decreased  Handedness: Right   Mood and Affect  Mood: Euthymic  Affect: Congruent   Thought Process  Thought Processes: Coherent  Descriptions of Associations:Intact  Orientation:Full (Time, Place and Person)  Thought Content:Logical  History of Schizophrenia/Schizoaffective disorder:No  Duration of Psychotic Symptoms:Greater than six months  Hallucinations:No data recorded Ideas of Reference:None  Suicidal Thoughts:No data recorded Homicidal Thoughts:No data recorded  Sensorium  Memory: Immediate Good; Recent Good; Remote Good  Judgment: Fair  Insight: Fair   Art therapist  Concentration: Good  Attention Span: Good  Recall: Good  Fund of Knowledge: Good  Language: Good   Psychomotor  Activity  Psychomotor Activity:No data recorded  Assets  Assets: Resilience; Physical Health   Sleep  Sleep:No data recorded    Blood pressure (!) 129/98, pulse 96, temperature 98.1 F (36.7 C), temperature source Oral, resp. rate 18, height 5\' 11"  (1.803 m), weight 65.8 kg, SpO2 98 %. Body mass index is 20.22 kg/m.   Treatment Plan Summary: Daily contact with patient to assess and evaluate symptoms and progress in treatment, Medication management, and Plan continue current medications.  Sarina Ill, DO 04/17/2022, 11:43 AM

## 2022-04-17 NOTE — Progress Notes (Signed)
Patient alert and oriented x 4, affect is flat and sad, thoughts are organized and coherent, he endorsed depression and rated it depression a 6/10 ( 0 low - high 10 ) he was offered emotional support and encouragement, and complaint with  medication regimen. 15 minutes safety checks maintained will continue to monitor.

## 2022-04-17 NOTE — [facility] Group Notes (Signed)
LCSW Group Therapy Note   04/17/2022 1:15pm   Type of Therapy and Topic:  Group Therapy:  Overcoming Obstacles   Participation Level:  None   Description of Group:    In this group patients will be encouraged to explore what they see as obstacles to their own wellness and recovery. They will be guided to discuss their thoughts, feelings, and behaviors related to these obstacles. The group will process together ways to cope with barriers, with attention given to specific choices patients can make. Each patient will be challenged to identify changes they are motivated to make in order to overcome their obstacles. This group will be process-oriented, with patients participating in exploration of their own experiences as well as giving and receiving support and challenge from other group members.   Therapeutic Goals: Patient will identify personal and current obstacles as they relate to admission. Patient will identify barriers that currently interfere with their wellness or overcoming obstacles.  Patient will identify feelings, thought process and behaviors related to these barriers. Patient will identify two changes they are willing to make to overcome these obstacles:      Summary of Patient Progress: pt was present in group but did not participate at all.  Also declined to answer when CSW called on him specifically about obstacles he is facing.        Therapeutic Modalities:   Cognitive Behavioral Therapy Solution Focused Therapy Motivational Interviewing Relapse Prevention Therapy  Lorri Frederick, LCSW 04/17/2022 3:00 PM

## 2022-04-17 NOTE — Progress Notes (Signed)
   04/17/22 2100  Psych Admission Type (Psych Patients Only)  Admission Status Voluntary  Psychosocial Assessment  Patient Complaints Anxiety  Eye Contact Fair  Facial Expression Animated  Affect Appropriate to circumstance  Speech Logical/coherent  Interaction Assertive  Motor Activity Slow  Appearance/Hygiene Unremarkable  Behavior Characteristics Cooperative;Appropriate to situation  Mood Pleasant  Thought Process  Coherency WDL  Content WDL  Delusions None reported or observed  Perception WDL  Hallucination None reported or observed  Judgment Poor  Confusion None  Danger to Self  Current suicidal ideation? Denies  Danger to Others  Danger to Others None reported or observed   Pt observed in the milieu interacting well with peers, denied SI/HI, AVH and contracted for safety. Pt complained of his headache not being relieved by tylenol. Pt also reported having difficulty with sleep, pt given his scheduled medication plus trazodone for sleep, will continue to monitor.

## 2022-04-17 NOTE — Group Note (Signed)
Date:  04/17/2022 Time:  5:30 PM  Group Topic/Focus:  Activity Group    Participation Level:  Did Not Attend   Lynelle Smoke [facility] 04/17/2022, 5:30 PM

## 2022-04-17 NOTE — Progress Notes (Signed)
D- Patient alert and oriented x 4. Affect flat/mood depressed. Denies SI/ HI/ AVH. He endorses depression and anxiety. Came to nursing station c/o headache. BP 126/102 P 102.NP notified and Vasotec increased to 20mg   daily and 10mg  now. Complains of anxiety and PRN Haldol 5mg  admin po for complaints. A- Scheduled medications administered to patient, per MD orders. Support and encouragement provided.  Routine safety checks conducted every 15 minutes.  Patient informed to notify staff with problems or concerns. R- No adverse drug reactions noted. Patient compliant with medications and treatment plan. Patient receptive, calm, cooperative and interacts well with others on the unit. Patient contracts for safety and remains safe on the unit at this time.

## 2022-04-17 NOTE — [facility] Group Notes (Signed)
[facility] Group Notes:  (Nursing/MHT/Case Management/Adjunct)  Date:  04/17/2022  Time:  9:29 PM  Type of Therapy:   wrap up  Participation Level:  Active  Participation Quality:  Appropriate  Affect:  Appropriate  Cognitive:  Appropriate  Insight:  Appropriate  Engagement in Group:  Engaged  Modes of Intervention:  Activity  Summary of Progress/Problems:  Tacy Dura 04/17/2022, 9:29 PM

## 2022-04-18 DIAGNOSIS — F332 Major depressive disorder, recurrent severe without psychotic features: Secondary | ICD-10-CM | POA: Diagnosis not present

## 2022-04-18 LAB — HEMOGLOBIN A1C
Hgb A1c MFr Bld: 5.5 % (ref 4.8–5.6)
Mean Plasma Glucose: 111 mg/dL

## 2022-04-18 MED ORDER — TRAZODONE HCL 50 MG PO TABS: 50.0000 mg | ORAL_TABLET | Freq: Every evening | ORAL | 0 refills | Status: DC | PRN

## 2022-04-18 MED ORDER — FLUOXETINE HCL 20 MG PO CAPS: 20.0000 mg | ORAL_CAPSULE | Freq: Every day | ORAL | 0 refills | Status: DC

## 2022-04-18 MED ORDER — OLANZAPINE 10 MG PO TABS: 10.0000 mg | ORAL_TABLET | Freq: Every day | ORAL | 0 refills | Status: AC

## 2022-04-18 NOTE — Discharge Summary (Signed)
Physician Discharge Summary Note  Patient:  [name] is an 62 y.o., male MRN:  [id number] DOB:  [birth year] Patient phone:  [phone] (home)  Patient address:   [place] [address],  Total Time spent with patient: 45 minutes  Date of Admission:  04/14/2022 Date of Discharge: 04/18/22  Reason for Admission:  depression and substance abuse  Principal Problem: Major depressive disorder, recurrent severe without psychotic features Discharge Diagnoses: Principal Problem:   Major depressive disorder, recurrent severe without psychotic features Active Problems:   Cluster B personality disorder   Cocaine use   Essential hypertension   Past Psychiatric History: depression, substance abuse  Past Medical History:  Past Medical History:  Diagnosis Date   Anxiety    Arthritis    knees and hands   Bipolar disorder    Depression    GERD (gastroesophageal reflux disease)    Hepatitis    HEP "C"   History of kidney stones    Hypertension    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 echo 5/17: EF 60-65%, no RWMA, LV dias fxn nl, LA mildly dilated, RV sys fxn nl, PASP nl, moderate sized circumferential pericardial effusion was identified, 2.12 cm around the LV free wall, <1 cm around the RV free wall. Features were not c/w tamponade physiology   PTSD (post-traumatic stress disorder)    Witnessed brother's suicide.   Restless leg syndrome    Seizures    Syncope     Past Surgical History:  Procedure Laterality Date   CYSTOSCOPY WITH URETEROSCOPY AND STENT PLACEMENT     ESOPHAGOGASTRODUODENOSCOPY N/A 01/11/2016   Procedure: ESOPHAGOGASTRODUODENOSCOPY (EGD);  Surgeon: Charlott Rakes, MD;  Location: [facility] ENDOSCOPY;  Service: Endoscopy;  Laterality: N/A;   ESOPHAGOGASTRODUODENOSCOPY N/A 04/09/2020   Procedure: ESOPHAGOGASTRODUODENOSCOPY (EGD);  Surgeon: [name], MD;  Location: [facility] ENDOSCOPY;  Service: Gastroenterology;   Laterality: N/A;   INCISION AND DRAINAGE ABSCESS Left 01/02/2018   Procedure: INCISION AND DRAINAGE LEFT KNEE;  Surgeon: Deeann Saint, MD;  Location: [facility] ORS;  Service: Orthopedics;  Laterality: Left;   JOINT REPLACEMENT Right    TKR   KNEE ARTHROSCOPY Right 06/25/2014   Procedure: ARTHROSCOPY KNEE;  Surgeon: Deeann Saint, MD;  Location: [facility] ORS;  Service: Orthopedics;  Laterality: Right;  partial arthroscopic medial menisectomy   LAPAROSCOPIC APPENDECTOMY N/A 06/02/2021   Procedure: APPENDECTOMY LAPAROSCOPIC;  Surgeon: Campbell Lerner, MD;  Location: [facility] ORS;  Service: General;  Laterality: N/A;   TOTAL KNEE ARTHROPLASTY Right 04/22/2015   Procedure: TOTAL KNEE ARTHROPLASTY;  Surgeon: Deeann Saint, MD;  Location: [facility] ORS;  Service: Orthopedics;  Laterality: Right;   TOTAL KNEE ARTHROPLASTY Left 10/30/2017   Procedure: TOTAL KNEE ARTHROPLASTY;  Surgeon: Deeann Saint, MD;  Location: [facility] ORS;  Service: Orthopedics;  Laterality: Left;   TOTAL KNEE REVISION Left 01/02/2018   Procedure: poly exchange of tibia and patella left knee;  Surgeon: Deeann Saint, MD;  Location: [facility] ORS;  Service: Orthopedics;  Laterality: Left;   UMBILICAL HERNIA REPAIR  06/02/2021   Procedure: HERNIA REPAIR UMBILICAL ADULT;  Surgeon: Campbell Lerner, MD;  Location: [facility] ORS;  Service: General;;   Family History:  Family History  Problem Relation Age of Onset   CVA Mother        deceased at age 50   Depression Brother        Died by suicide at age 44   Family Psychiatric  History: see above Social  History:  Social History   Substance and Sexual Activity  Alcohol Use Not Currently   Comment: rare     Social History   Substance and Sexual Activity  Drug Use Yes   Types: Marijuana, Cocaine    Social History   Socioeconomic History   Marital status: Single    Spouse name: Not on file   Number of children: Not on file   Years of education: Not on file   Highest education level: Not on file   Occupational History   Not on file  Tobacco Use   Smoking status: Former    Packs/day: 0.75    Years: 20.00    Additional pack years: 0.00    Total pack years: 15.00    Types: Cigarettes    Quit date: 05/16/1984    Years since quitting: 37.9   Smokeless tobacco: Never  Vaping Use   Vaping Use: Never used  Substance and Sexual Activity   Alcohol use: Not Currently    Comment: rare   Drug use: Yes    Types: Marijuana, Cocaine   Sexual activity: Not on file  Other Topics Concern   Not on file  Social History Narrative   Not on file   Social Determinants of Health   Financial Resource Strain: Not on file  Food Insecurity: Food Insecurity Present (04/14/2022)   Hunger Vital Sign    Worried About Running Out of Food in the Last Year: Sometimes true    Ran Out of Food in the Last Year: Never true  Transportation Needs: No Transportation Needs (04/14/2022)   PRAPARE - Administrator, Civil Service (Medical): No    Lack of Transportation (Non-Medical): No  Physical Activity: Not on file  Stress: Not on file  Social Connections: Not on file    Hospital Course:   Client reported "I came in because I twisted my hip and thought I should come to psych and get my meds straight since I haven't been on them in 2 years."  The client's medications were restarted and his symptoms dissipated along with the substances leaving his system, cocaine. [name] requested to leave as he feels better and missing his dogs and cats. He denies suicidal/homicidal ideations, hallucinations, or withdrawal symptoms.  He will follow up with [facility] in [place], psych cleared.   Musculoskeletal: Strength & Muscle Tone: within normal limits Gait & Station: normal Patient leans: N/A  Psychiatric Specialty Exam: Physical Exam Vitals and nursing note reviewed.  Constitutional:      Appearance: Normal appearance.  HENT:     Head: Normocephalic.     Nose: Nose normal.  Pulmonary:     Effort:  Pulmonary effort is normal.  Musculoskeletal:        General: Normal range of motion.     Cervical back: Normal range of motion.  Neurological:     General: No focal deficit present.     Mental Status: He is alert and oriented to person, place, and time.  Psychiatric:        Attention and Perception: Attention and perception normal.        Mood and Affect: Mood is anxious.        Speech: Speech normal.        Behavior: Behavior normal. Behavior is cooperative.        Thought Content: Thought content normal.        Cognition and Memory: Cognition and memory normal.  Judgment: Judgment normal.     Review of Systems  Psychiatric/Behavioral:  Positive for substance abuse. The patient is nervous/anxious.   All other systems reviewed and are negative.   Blood pressure 110/86, pulse 100, temperature 98.3 F (36.8 C), temperature source Oral, resp. rate 18, height 5\' 11"  (1.803 m), weight 65.8 kg, SpO2 97 %.Body mass index is 20.22 kg/m.  General Appearance: Casual  Eye Contact:  Good  Speech:  Normal Rate  Volume:  Normal  Mood:  Anxious  Affect:  Congruent  Thought Process:  Coherent  Orientation:  Full (Time, Place, and Person)  Thought Content:  WDL and Logical  Suicidal Thoughts:  No  Homicidal Thoughts:  No  Memory:  Immediate;   Good Recent;   Good Remote;   Good  Judgement:  Good  Insight:  Good  Psychomotor Activity:  Normal  Concentration:  Concentration: Good and Attention Span: Good  Recall:  Good  Fund of Knowledge:  Good  Language:  Good  Akathisia:  No  Handed:  Right  AIMS (if indicated):     Assets:  Housing Leisure Time Physical Health Resilience Social Support  ADL's:  Intact  Cognition:  WNL  Sleep:         Physical Exam: Physical Exam Vitals and nursing note reviewed.  Constitutional:      Appearance: Normal appearance.  HENT:     Head: Normocephalic.     Nose: Nose normal.  Pulmonary:     Effort: Pulmonary effort is normal.   Musculoskeletal:        General: Normal range of motion.     Cervical back: Normal range of motion.  Neurological:     General: No focal deficit present.     Mental Status: He is alert and oriented to person, place, and time.  Psychiatric:        Attention and Perception: Attention and perception normal.        Mood and Affect: Mood is anxious.        Speech: Speech normal.        Behavior: Behavior normal. Behavior is cooperative.        Thought Content: Thought content normal.        Cognition and Memory: Cognition and memory normal.        Judgment: Judgment normal.    Review of Systems  Psychiatric/Behavioral:  Positive for substance abuse. The patient is nervous/anxious.   All other systems reviewed and are negative.  Blood pressure 110/86, pulse 100, temperature 98.3 F (36.8 C), temperature source Oral, resp. rate 18, height 5\' 11"  (1.803 m), weight 65.8 kg, SpO2 97 %. Body mass index is 20.22 kg/m.   Social History   Tobacco Use  Smoking Status Former   Packs/day: 0.75   Years: 20.00   Additional pack years: 0.00   Total pack years: 15.00   Types: Cigarettes   Quit date: 05/16/1984   Years since quitting: 37.9  Smokeless Tobacco Never   Tobacco Cessation:  N/A, patient does not currently use tobacco products   Blood Alcohol level:  Lab Results  Component Value Date   ETH <10 04/13/2022   ETH <10 07/11/2021    Metabolic Disorder Labs:  Lab Results  Component Value Date   HGBA1C 5.5 04/16/2022   MPG 111 04/16/2022   MPG 105.41 01/18/2021   No results found for: "PROLACTIN" Lab Results  Component Value Date   CHOL 184 04/16/2022   TRIG 133 04/16/2022   HDL  32 (L) 04/16/2022   CHOLHDL 5.8 04/16/2022   VLDL 27 04/16/2022   LDLCALC 125 (H) 04/16/2022   LDLCALC 128 (H) 01/18/2021    See Psychiatric Specialty Exam and Suicide Risk Assessment completed by Attending Physician prior to discharge.  Discharge destination:  Home  Is patient on multiple  antipsychotic therapies at discharge:  No   Has Patient had three or more failed trials of antipsychotic monotherapy by history:  No  Recommended Plan for Multiple Antipsychotic Therapies: NA   Allergies as of 04/18/2022   No Known Allergies      Medication List     STOP taking these medications    doxepin 100 MG capsule Commonly known as: SINEQUAN   gabapentin 300 MG capsule Commonly known as: NEURONTIN   gabapentin 400 MG capsule Commonly known as: NEURONTIN   haloperidol 5 MG tablet Commonly known as: HALDOL   traMADol 50 MG tablet Commonly known as: ULTRAM       TAKE these medications      Indication  enalapril 20 MG tablet Commonly known as: VASOTEC Take 20 mg by mouth daily.  Indication: High Blood Pressure Disorder   FLUoxetine 20 MG capsule Commonly known as: PROZAC Take 1 capsule (20 mg total) by mouth daily. Start taking on: April 19, 2022  Indication: Depression   OLANZapine 10 MG tablet Commonly known as: ZYPREXA Take 1 tablet (10 mg total) by mouth at bedtime.  Indication: mood disorder   traZODone 50 MG tablet Commonly known as: DESYREL Take 1 tablet (50 mg total) by mouth at bedtime as needed (Anxiety). What changed:  how much to take when to take this  Indication: Trouble Sleeping        Follow-up Information     [facility] Go on 04/20/2022.   Why: Please present for scheduled appointment on 4/10 at 3:00 PM. Contact information: [address] [place] [address] [phone]                 Follow-up recommendations:   Major depressive disorder, recurrent, moderate: Prozac 20 mg daily Zyprexa 10 mg daily   Insomnia: Trazodone 50 mg daily PRN    Activity:  as tolerated Diet:  heart healthy diet  Comments:  follow up with [facility]  Signed: Nanine Means, NP 04/18/2022, 12:46 PM

## 2022-04-18 NOTE — BHH Counselor (Signed)
CSW met with pt to discuss discharge briefly. He shared that he needs a ride home. CSW confirmed address at [address], [place], [place] [address]. Pt stated that he has clothes to wear home. He denied use of tobacco or need for cessation services. Pt endorsed some use of marijuana but denied any need for substance use specific services upon discharge. UDS positive for not only cannabis, but cocaine and opiates. No other concerns expressed. Contact ended without incident.   Vilma Meckel. Algis Greenhouse, MSW, LCSW, LCAS 04/18/2022 11:12 AM

## 2022-04-18 NOTE — Plan of Care (Signed)
  Problem: Education: Goal: Knowledge of General Education information will improve Description Including pain rating scale, medication(s)/side effects and non-pharmacologic comfort measures Outcome: Progressing   Problem: Health Behavior/Discharge Planning: Goal: Ability to manage health-related needs will improve Outcome: Progressing   

## 2022-04-18 NOTE — Group Note (Signed)
Recreation Therapy Group Note   Group Topic:Healthy Support Systems  Group Date: 04/18/2022 Start Time: 1000 End Time: 1100 Facilitators: Rosina Lowenstein, LRT, CTRS Location:  Craft Room  Group Description: Straw Bridge. Patients were given 10 plastic drinking straws and an equal length of masking tape. Using the materials provided, patients were instructed to build a free-standing bridge-like structure to suspend an everyday item (ex: deck of cards) off the floor or table surface. All materials were required to be used in Secondary school teacher. LRT facilitated post-activity discussion reviewing the importance of having strong and healthy support systems in our lives. LRT discussed how the people in our lives serve as the tape and the book we placed on top of our straw structure are the stressors we face in daily life. LRT and pts discussed what happens in our life when things get too heavy for [place], and we don't have strong supports outside of the hospital. Pt shared 2 of their healthy supports aloud in the group.   Affect/Mood: N/A   Participation Level: Did not attend    Clinical Observations/Individualized Feedback: Patient did not attend group due to resting in his room.  Plan: Continue to engage patient in RT group sessions 2-3x/week.   Rosina Lowenstein, LRT, CTRS 04/18/2022 12:00 PM

## 2022-04-18 NOTE — Progress Notes (Signed)
Patient ID: [name], male   DOB: 1960-08-06, 62 y.o.   MRN: [id number]  Patient was discharged from the Behavioral Medicine unit at approx 1330 escorted by staff. Patient denies SI/HI/AVH. Discharge packet to include printed AVS, Suicide Risk Assessment, and Transition Record reviewed with patient. Belongings returned and patient verified receipt with signature. Suicide safety plan completed with a copy kept in chart.

## 2022-04-18 NOTE — BHH Suicide Risk Assessment (Cosign Needed Addendum)
[facility] Discharge Suicide Risk Assessment   Principal Problem: Major depressive disorder, recurrent severe without psychotic features Discharge Diagnoses: Principal Problem:   Major depressive disorder, recurrent severe without psychotic features Active Problems:   Cluster B personality disorder   Cocaine use   Essential hypertension   Total Time spent with patient: 45 minutes  Client reported "I came in because I twisted my hip and thought I should come to psych and get my meds straight since I haven't been on them in 2 years."  The client's medications were restarted and his symptoms dissipated along with the cocaine getting out of his system. Krishiv requested to leave as he feels better and missing his dogs and cats. He denies suicidal/homicidal ideations, hallucinations, or withdrawal symptoms.  He will follow up with [facility] in [place], psych cleared.  Musculoskeletal: Strength & Muscle Tone: within normal limits Gait & Station: normal Patient leans: N/A  Psychiatric Specialty Exam: Physical Exam Vitals and nursing note reviewed.  Constitutional:      Appearance: Normal appearance.  HENT:     Head: Normocephalic.     Nose: Nose normal.  Pulmonary:     Effort: Pulmonary effort is normal.  Musculoskeletal:        General: Normal range of motion.     Cervical back: Normal range of motion.  Neurological:     General: No focal deficit present.     Mental Status: He is alert and oriented to person, place, and time.  Psychiatric:        Attention and Perception: Attention and perception normal.        Mood and Affect: Mood is anxious.        Speech: Speech normal.        Behavior: Behavior normal. Behavior is cooperative.        Thought Content: Thought content normal.        Cognition and Memory: Cognition and memory normal.        Judgment: Judgment normal.     Review of Systems  Psychiatric/Behavioral:  Positive for substance abuse. The patient is nervous/anxious.   All  other systems reviewed and are negative.   Blood pressure 110/86, pulse 100, temperature 98.3 F (36.8 C), temperature source Oral, resp. rate 18, height 5\' 11"  (1.803 m), weight 65.8 kg, SpO2 97 %.Body mass index is 20.22 kg/m.  General Appearance: Casual  Eye Contact:  Good  Speech:  Normal Rate  Volume:  Normal  Mood:  Anxious  Affect:  Congruent  Thought Process:  Coherent  Orientation:  Full (Time, Place, and Person)  Thought Content:  WDL and Logical  Suicidal Thoughts:  No  Homicidal Thoughts:  No  Memory:  Immediate;   Good Recent;   Good Remote;   Good  Judgement:  Good  Insight:  Good  Psychomotor Activity:  Normal  Concentration:  Concentration: Good and Attention Span: Good  Recall:  Good  Fund of Knowledge:  Good  Language:  Good  Akathisia:  No  Handed:  Right  AIMS (if indicated):     Assets:  Housing Leisure Time Physical Health Resilience Social Support  ADL's:  Intact  Cognition:  WNL  Sleep:        Physical Exam: Physical Exam Vitals and nursing note reviewed.  Constitutional:      Appearance: Normal appearance.  HENT:     Head: Normocephalic.     Nose: Nose normal.  Pulmonary:     Effort: Pulmonary effort is normal.  Musculoskeletal:        General: Normal range of motion.     Cervical back: Normal range of motion.  Neurological:     General: No focal deficit present.     Mental Status: He is alert and oriented to person, place, and time.  Psychiatric:        Attention and Perception: Attention and perception normal.        Mood and Affect: Mood is anxious.        Speech: Speech normal.        Behavior: Behavior normal. Behavior is cooperative.        Thought Content: Thought content normal.        Cognition and Memory: Cognition and memory normal.        Judgment: Judgment normal.    Review of Systems  Psychiatric/Behavioral:  Positive for substance abuse. The patient is nervous/anxious.   All other systems reviewed and are  negative.  Blood pressure 110/86, pulse 100, temperature 98.3 F (36.8 C), temperature source Oral, resp. rate 18, height 5\' 11"  (1.803 m), weight 65.8 kg, SpO2 97 %. Body mass index is 20.22 kg/m.  Mental Status Per Nursing Assessment::   On Admission:  NA  Demographic Factors:  Male and Caucasian  Loss Factors: NA  Historical Factors: NA  Risk Reduction Factors:   Living with another person, especially a relative, Positive social support, and Positive therapeutic relationship  Continued Clinical Symptoms:  Anxiety, mild  Cognitive Features That Contribute To Risk:  None    Suicide Risk:  Minimal: No identifiable suicidal ideation.  Patients presenting with no risk factors but with morbid ruminations; may be classified as minimal risk based on the severity of the depressive symptoms   Follow-up Information      [facility] Go on 04/20/2022.   Why: Please present for scheduled appointment on 4/10 at 3:00 PM. Contact information: [address] [place] [address] [phone]                 Plan Of Care/Follow-up recommendations:  Major depressive disorder, recurrent, moderate: Prozac 20 mg daily Zyprexa 10 mg daily  Insomnia: Trazodone 50 mg daily PRN   Activity:  as tolerated Diet:  heart healthy diet  Nanine Means, NP 04/18/2022, 12:36 PM

## 2022-04-18 NOTE — Progress Notes (Signed)
  [facility] Adult Case Management Discharge Plan :  Will you be returning to the same living situation after discharge:  Yes,  pt plans to return home upon discharge. At discharge, do you have transportation home?: Yes,  CSW to assist with transportation arrangements.  Do you have the ability to pay for your medications: Yes,  [employer].  Release of information consent forms completed and in the chart;  Patient's signature needed at discharge.  Patient to Follow up at:  Follow-up Information     [facility] Go on 04/20/2022.   Why: Please present for scheduled appointment on 4/10 at 3:00 PM. Contact information: [address] [place] [address] [phone]                 Next level of care provider has access to [facility] Link:no  Safety Planning and Suicide Prevention discussed: Yes,  SPE completed with pt.     Has patient been referred to the Quitline?: Patient refused referral  Patient has been referred for addiction treatment: Pt. refused referral  Glenis Smoker, LCSW 04/18/2022, 11:12 AM

## 2022-07-05 ENCOUNTER — Emergency Department (EMERGENCY_DEPARTMENT_HOSPITAL)
Admission: AD | Admit: 2022-07-05 | Discharge: 2022-07-06 | Disposition: A | Discharge status: Other Acute Inpatient Hospital | Payer: No Typology Code available for payment source | Source: Home / Self Care | Attending: Emergency Medicine | Admitting: Emergency Medicine

## 2022-07-05 ENCOUNTER — Encounter: Payer: Self-pay | Admitting: Emergency Medicine

## 2022-07-05 ENCOUNTER — Other Ambulatory Visit: Payer: Self-pay

## 2022-07-05 DIAGNOSIS — R45851 Suicidal ideations: Secondary | ICD-10-CM

## 2022-07-05 DIAGNOSIS — Z20822 Contact with and (suspected) exposure to covid-19: Secondary | ICD-10-CM | POA: Insufficient documentation

## 2022-07-05 DIAGNOSIS — F333 Major depressive disorder, recurrent, severe with psychotic symptoms: Secondary | ICD-10-CM | POA: Insufficient documentation

## 2022-07-05 DIAGNOSIS — Z79899 Other long term (current) drug therapy: Secondary | ICD-10-CM | POA: Insufficient documentation

## 2022-07-05 HISTORY — DX: Bipolar disorder, unspecified: F31.9

## 2022-07-05 HISTORY — DX: Essential (primary) hypertension: I10

## 2022-07-05 HISTORY — DX: Depression, unspecified: F32.A

## 2022-07-05 HISTORY — DX: Anxiety disorder, unspecified: F41.9

## 2022-07-05 LAB — COMPREHENSIVE METABOLIC PANEL
ALT: 16 U/L (ref 0–44)
AST: 16 U/L (ref 15–41)
Albumin: 4.3 g/dL (ref 3.5–5.0)
Alkaline Phosphatase: 65 U/L (ref 38–126)
Anion gap: 10 (ref 5–15)
BUN: 22 mg/dL — ABNORMAL HIGH (ref 6–20)
CO2: 21 mmol/L — ABNORMAL LOW (ref 22–32)
Calcium: 9.6 mg/dL (ref 8.9–10.3)
Chloride: 104 mmol/L (ref 98–111)
Creatinine, Ser: 0.96 mg/dL (ref 0.61–1.24)
GFR, Estimated: >60 mL/min (ref >60)
Glucose, Bld: 104 mg/dL — ABNORMAL HIGH (ref 70–99)
Potassium: 3.9 mmol/L (ref 3.5–5.1)
Sodium: 135 mmol/L (ref 135–145)
Total Bilirubin: 0.6 mg/dL (ref 0.3–1.2)
Total Protein: 7.5 g/dL (ref 6.5–8.1)

## 2022-07-05 LAB — URINE DRUG SCREEN, QUALITATIVE (ARMC ONLY)
Amphetamines, Ur Screen: NOT DETECTED
Barbiturates, Ur Screen: NOT DETECTED
Benzodiazepine, Ur Scrn: NOT DETECTED
Cannabinoid 50 Ng, Ur ~~LOC~~: POSITIVE — AB
Cocaine Metabolite,Ur ~~LOC~~: NOT DETECTED
MDMA (Ecstasy)Ur Screen: NOT DETECTED
Methadone Scn, Ur: NOT DETECTED
Opiate, Ur Screen: NOT DETECTED
Phencyclidine (PCP) Ur S: NOT DETECTED
Tricyclic, Ur Screen: POSITIVE — AB

## 2022-07-05 LAB — CBC
HCT: 43.3 % (ref 39.0–52.0)
Hemoglobin: 14.5 g/dL (ref 13.0–17.0)
MCH: 29.5 pg (ref 26.0–34.0)
MCHC: 33.5 g/dL (ref 30.0–36.0)
MCV: 88 fL (ref 80.0–100.0)
Platelets: 263 10*3/uL (ref 150–400)
RBC: 4.92 MIL/uL (ref 4.22–5.81)
RDW: 14.8 % (ref 11.5–15.5)
WBC: 6.8 10*3/uL (ref 4.0–10.5)
nRBC: 0 % (ref 0.0–0.2)

## 2022-07-05 LAB — ETHANOL: Alcohol, Ethyl (B): <10 mg/dL (ref <10)

## 2022-07-05 LAB — SALICYLATE LEVEL: Salicylate Lvl: <7 mg/dL — ABNORMAL LOW (ref 7.0–30.0)

## 2022-07-05 LAB — ACETAMINOPHEN LEVEL: Acetaminophen (Tylenol), Serum: <10 ug/mL — ABNORMAL LOW (ref 10–30)

## 2022-07-05 NOTE — ED Provider Notes (Signed)
[facility] Provider Note    Event Date/Time   First MD Initiated Contact with Patient 07/05/22 2325     (approximate)   History   Psychiatric Evaluation   HPI  [name] is a 62 y.o. male here with worsening hallucinations and suicidal ideation.  The patient has an extensive psychiatric history.  He states that he has been hearing voices and these have been progressively worsening.  He states he is concerned that he is going to hurt himself at home.  Subsequent presents evaluation.  Is here voluntarily.  Denies any recent fevers or chills.  Is been taking his medications as prescribed.     Physical Exam   Triage Vital Signs: ED Triage Vitals  Enc Vitals Group     BP 07/05/22 2120 (!) 128/104     Pulse Rate 07/05/22 2120 78     Resp 07/05/22 2120 18     Temp 07/05/22 2120 98.2 F (36.8 C)     Temp Source 07/05/22 2120 Oral     SpO2 07/05/22 2120 98 %     Weight 07/05/22 2120 176 lb (79.8 kg)     Height 07/05/22 2120 5\' 9"  (1.753 m)     Head Circumference --      Peak Flow --      Pain Score 07/05/22 2122 0     Pain Loc --      Pain Edu? --      Excl. in GC? --     Most recent vital signs: Vitals:   07/05/22 2120  BP: (!) 128/104  Pulse: 78  Resp: 18  Temp: 98.2 F (36.8 C)  SpO2: 98%     General: Awake, no distress.  CV:  Good peripheral perfusion.  Resp:  Normal work of breathing.  Abd:  No distention.  No tenderness. Other:  Calm, cooperative.  Endorses suicidal nation and worsening depression with auditory elucidation's.   ED Results / Procedures / Treatments   Labs (all labs ordered are listed, but only abnormal results are displayed) Labs Reviewed  COMPREHENSIVE METABOLIC PANEL - Abnormal; Notable for the following components:      Result Value   CO2 21 (*)    Glucose, Bld 104 (*)    BUN 22 (*)    All other components within normal limits  SALICYLATE LEVEL - Abnormal; Notable for the following components:    Salicylate Lvl <7.0 (*)    All other components within normal limits  ACETAMINOPHEN LEVEL - Abnormal; Notable for the following components:   Acetaminophen (Tylenol), Serum <10 (*)    All other components within normal limits  URINE DRUG SCREEN, QUALITATIVE ([facility] ONLY) - Abnormal; Notable for the following components:   Tricyclic, Ur Screen POSITIVE (*)    Cannabinoid 50 Ng, Ur [place] POSITIVE (*)    All other components within normal limits  ETHANOL  CBC     EKG    RADIOLOGY    I also independently reviewed and agree with radiologist interpretations.   PROCEDURES:  Critical Care performed: No   MEDICATIONS ORDERED IN ED: Medications  alum & mag hydroxide-simeth (MAALOX/MYLANTA) 200-200-20 MG/5ML suspension 30 mL (has no administration in time range)     IMPRESSION / MDM / ASSESSMENT AND PLAN / ED COURSE  I reviewed the triage vital signs and the nursing notes.  Differential diagnosis includes, but is not limited to, worsening depression, psychosis, substance-induced mood disorder, situational issue/adjustment disorder  Patient's presentation is most consistent with acute presentation with potential threat to life or bodily function.  62 year old male here with worsening depression and auditory elucidation's.  Patient medically stable.  CBC unremarkable.  CMP at baseline.  UDS positive for tricyclics and cannabinoids.  Salicylate and Tylenol level negative.  Alcohol level negative.  Does not appear intoxicated.  Is here voluntarily.  Will consult TTS and psychiatry for evaluation.  Medically stable for psychiatric disposition.      FINAL CLINICAL IMPRESSION(S) / ED DIAGNOSES   Final diagnoses:  Suicidal ideation     Rx / DC Orders   ED Discharge Orders     None        Note:  This document was prepared using Dragon voice recognition software and may include unintentional dictation errors.   Shaune Pollack, MD 07/06/22  Earle Gell

## 2022-07-05 NOTE — ED Triage Notes (Signed)
Pt presents ambulatory to triage via POV with complaints of SI for several days with a plan to "hang himself". He notes having some auditory hallucinations telling him to harm himself. Hx of cutting - no visible injuries at this time. Pt is calm and cooperative in triage. A&Ox4 at this time. Denies CP or SOB.

## 2022-07-05 NOTE — ED Notes (Signed)
Pt reports Si with plan to hang, has not made attempt, came to ER to seek help to avoid taking action. States hx of self cutting, no cuts in last 6 months. Reports no HI, decreased VH with no issues now, AH telling him to harm self. States he takes meds as prescribed. Reports weed use approx 1 time a week. Pt calm, cooperative, some anxiousness and restless but remains in place and follows commands. Pt states he has seeked help for this in past. Blanket, food, and drink provided. Educated on how to use recliner he is in.

## 2022-07-05 NOTE — ED Notes (Addendum)
Pt dressed out by this RN and Judeth Cornfield, NT belongings include:  Black cell phone Black charger [employer] Black shoes Black pants  [employer] underwear Black belt

## 2022-07-06 ENCOUNTER — Other Ambulatory Visit: Payer: Self-pay

## 2022-07-06 ENCOUNTER — Encounter: Payer: Self-pay | Admitting: Psychiatry

## 2022-07-06 ENCOUNTER — Inpatient Hospital Stay
Admission: AD | Admit: 2022-07-06 | Discharge: 2022-07-13 | DRG: 885 | Disposition: A | Discharge status: Home / Self Care | Payer: No Typology Code available for payment source | Source: Intra-hospital | Attending: Psychiatry | Admitting: Psychiatry

## 2022-07-06 DIAGNOSIS — F323 Major depressive disorder, single episode, severe with psychotic features: Principal | ICD-10-CM

## 2022-07-06 DIAGNOSIS — F329 Major depressive disorder, single episode, unspecified: Secondary | ICD-10-CM | POA: Insufficient documentation

## 2022-07-06 DIAGNOSIS — F32A Depression, unspecified: Secondary | ICD-10-CM

## 2022-07-06 DIAGNOSIS — R45851 Suicidal ideations: Secondary | ICD-10-CM

## 2022-07-06 DIAGNOSIS — Z87891 Personal history of nicotine dependence: Secondary | ICD-10-CM | POA: Diagnosis not present

## 2022-07-06 DIAGNOSIS — Z5982 Transportation insecurity: Secondary | ICD-10-CM | POA: Diagnosis not present

## 2022-07-06 DIAGNOSIS — G2581 Restless legs syndrome: Secondary | ICD-10-CM | POA: Diagnosis present

## 2022-07-06 DIAGNOSIS — Z9152 Personal history of nonsuicidal self-harm: Secondary | ICD-10-CM | POA: Diagnosis not present

## 2022-07-06 DIAGNOSIS — Z79899 Other long term (current) drug therapy: Secondary | ICD-10-CM | POA: Diagnosis not present

## 2022-07-06 DIAGNOSIS — Z5986 Financial insecurity: Secondary | ICD-10-CM | POA: Diagnosis not present

## 2022-07-06 DIAGNOSIS — F4310 Post-traumatic stress disorder, unspecified: Secondary | ICD-10-CM | POA: Diagnosis present

## 2022-07-06 DIAGNOSIS — G4089 Other seizures: Secondary | ICD-10-CM | POA: Diagnosis present

## 2022-07-06 DIAGNOSIS — F339 Major depressive disorder, recurrent, unspecified: Secondary | ICD-10-CM | POA: Diagnosis not present

## 2022-07-06 DIAGNOSIS — F419 Anxiety disorder, unspecified: Secondary | ICD-10-CM | POA: Diagnosis present

## 2022-07-06 DIAGNOSIS — G4700 Insomnia, unspecified: Secondary | ICD-10-CM | POA: Diagnosis present

## 2022-07-06 DIAGNOSIS — Z5941 Food insecurity: Secondary | ICD-10-CM

## 2022-07-06 DIAGNOSIS — Z1152 Encounter for screening for COVID-19: Secondary | ICD-10-CM

## 2022-07-06 DIAGNOSIS — F1210 Cannabis abuse, uncomplicated: Secondary | ICD-10-CM | POA: Diagnosis present

## 2022-07-06 DIAGNOSIS — I10 Essential (primary) hypertension: Secondary | ICD-10-CM | POA: Diagnosis present

## 2022-07-06 LAB — SARS CORONAVIRUS 2 BY RT PCR: SARS Coronavirus 2 by RT PCR: NEGATIVE

## 2022-07-06 MED ORDER — ALUM & MAG HYDROXIDE-SIMETH 200-200-20 MG/5ML PO SUSP: 30.0000 mL | ORAL | Status: DC | PRN

## 2022-07-06 MED ORDER — HYDROXYZINE HCL 25 MG PO TABS
25.0000 mg | ORAL_TABLET | Freq: Three times a day (TID) | ORAL | Status: DC | PRN
  Administered 2022-07-06 – 2022-07-12 (×8): 25 mg via ORAL
  Filled 2022-07-06 (×8): qty 1

## 2022-07-06 MED ORDER — HALOPERIDOL LACTATE 5 MG/ML IJ SOLN: 5.0000 mg | Freq: Three times a day (TID) | INTRAMUSCULAR | Status: DC | PRN

## 2022-07-06 MED ORDER — HALOPERIDOL 5 MG PO TABS
5.0000 mg | ORAL_TABLET | Freq: Three times a day (TID) | ORAL | Status: DC | PRN
  Administered 2022-07-08 – 2022-07-11 (×2): 5 mg via ORAL
  Filled 2022-07-06 (×2): qty 1

## 2022-07-06 MED ORDER — DIPHENHYDRAMINE HCL 50 MG/ML IJ SOLN: 50.0000 mg | Freq: Three times a day (TID) | INTRAMUSCULAR | Status: DC | PRN

## 2022-07-06 MED ORDER — LORAZEPAM 2 MG PO TABS
2.0000 mg | ORAL_TABLET | Freq: Three times a day (TID) | ORAL | Status: DC | PRN
  Administered 2022-07-06 – 2022-07-11 (×4): 2 mg via ORAL
  Filled 2022-07-06 (×4): qty 1

## 2022-07-06 MED ORDER — ALUM & MAG HYDROXIDE-SIMETH 200-200-20 MG/5ML PO SUSP: 30.0000 mL | Freq: Four times a day (QID) | ORAL | Status: DC | PRN

## 2022-07-06 MED ORDER — DIPHENHYDRAMINE HCL 25 MG PO CAPS
50.0000 mg | ORAL_CAPSULE | Freq: Three times a day (TID) | ORAL | Status: DC | PRN
  Administered 2022-07-08 – 2022-07-11 (×2): 50 mg via ORAL
  Filled 2022-07-06 (×2): qty 2

## 2022-07-06 MED ORDER — ACETAMINOPHEN 325 MG PO TABS
650.0000 mg | ORAL_TABLET | Freq: Four times a day (QID) | ORAL | Status: DC | PRN
  Administered 2022-07-08: 650 mg via ORAL
  Filled 2022-07-06: qty 2

## 2022-07-06 MED ORDER — LORAZEPAM 2 MG/ML IJ SOLN: 2.0000 mg | Freq: Three times a day (TID) | INTRAMUSCULAR | Status: DC | PRN

## 2022-07-06 MED ORDER — MAGNESIUM HYDROXIDE 400 MG/5ML PO SUSP: 30.0000 mL | Freq: Every day | ORAL | Status: DC | PRN

## 2022-07-06 MED ORDER — TRAZODONE HCL 50 MG PO TABS
50.0000 mg | ORAL_TABLET | Freq: Every evening | ORAL | Status: DC | PRN
  Administered 2022-07-07: 50 mg via ORAL
  Filled 2022-07-06: qty 1

## 2022-07-06 NOTE — Group Note (Signed)
Date:  07/06/2022 Time:  4:53 PM  Group Topic/Focus:  Activity Group:  Focus of the group is to encourage patients to go outside in the courtyard and get some fresh air to assist with their mental and physical well-being.    Participation Level:  Did Not Attend   Mary Sella Shevon Sian 07/06/2022, 4:53 PM

## 2022-07-06 NOTE — Tx Team (Signed)
Initial Treatment Plan 07/06/2022 3:18 PM [name] WNU:[id number]    PATIENT STRESSORS: Financial difficulties   Traumatic event     PATIENT STRENGTHS: Average or above average intelligence  Communication skills  Motivation for treatment/growth  Physical Health    PATIENT IDENTIFIED PROBLEMS: Suicidal ideation   Long tem depression and PTSD                   DISCHARGE CRITERIA:  Adequate post-discharge living arrangements Medical problems require only outpatient monitoring Motivation to continue treatment in a less acute level of care Verbal commitment to aftercare and medication compliance  PRELIMINARY DISCHARGE PLAN: Attend aftercare/continuing care group Return to previous living arrangement  PATIENT/FAMILY INVOLVEMENT: This treatment plan has been presented to and reviewed with the patient, [name], and/or family member,   The patient and family have been given the opportunity to ask questions and make suggestions.  Leonarda Salon, RN 07/06/2022, 3:18 PM

## 2022-07-06 NOTE — BH Assessment (Addendum)
Comprehensive Clinical Assessment (CCA) Note  07/06/2022 [name] [id number]  Chief Complaint: Patient is a 62 year old male presenting to [facility] ED voluntarily. Per triage note Pt presents ambulatory to triage via POV with complaints of SI for several days with a plan to "hang himself". He notes having some auditory hallucinations telling him to harm himself. Hx of cutting - no visible injuries at this time. Pt is calm and cooperative in triage. A&Ox4 at this time. Denies CP or SOB. During assessment patient appears alert and oriented x4, calm and cooperative, mood appears depressed. Patient reports "I've been having depression for a while" he reports an attempt last year via cutting. Patient reports taking 2 mental health medications but has no psychiatrist to prescribe it, he reports that he got the prescription here from the ED. Patient reports poor sleep and a fair appetite, he is currently living with a friend, and has a past hospitalization with [facility] "2-3 months ago." Patient reports continued SI with a plan, he denies HI, he reports AH that are telling him to kill himself and he reports having VH earlier last night "I was seeing my dead brother" but reports that his Haldol helped with his VH.  Per Psyc NP Chinwendu patient is recommended for Inpatient Chief Complaint  Patient presents with   Psychiatric Evaluation   Visit Diagnosis: Major Depressive Disorder, recurrent episode with psychosis    CCA Screening, Triage and Referral (STR)  Patient Reported Information How did you hear about [place]? Self  Referral name: No data recorded Referral phone number: No data recorded  Whom do you see for routine medical problems? No data recorded Practice/Facility Name: No data recorded Practice/Facility Phone Number: No data recorded Name of Contact: No data recorded Contact Number: No data recorded Contact Fax Number: No data recorded Prescriber Name: No data recorded Prescriber Address  (if known): No data recorded  What Is the Reason for Your Visit/Call Today? Pt presents ambulatory to triage via POV with complaints of SI for several days with a plan to "hang himself". He notes having some auditory hallucinations telling him to harm himself. Hx of cutting - no visible injuries at this time. Pt is calm and cooperative in triage. A&Ox4 at this time. Denies CP or SOB.  How Long Has This Been Causing You Problems? > than 6 months  What Do You Feel Would Help You the Most Today? Treatment for Depression or other mood problem   Have You Recently Been in Any Inpatient Treatment (Hospital/Detox/Crisis Center/28-Day Program)? No data recorded Name/Location of Program/Hospital:No data recorded How Long Were You There? No data recorded When Were You Discharged? No data recorded  Have You Ever Received Services From [facility] Before? No data recorded Who Do You See at [facility]? No data recorded  Have You Recently Had Any Thoughts About Hurting Yourself? Yes  Are You Planning to Commit Suicide/Harm Yourself At This time? Yes   Have you Recently Had Thoughts About Hurting Someone Karolee Ohs? No  Explanation: No data recorded  Have You Used Any Alcohol or Drugs in the Past 24 Hours? Yes  How Long Ago Did You Use Drugs or Alcohol? No data recorded What Did You Use and How Much? Marijuana   Do You Currently Have a Therapist/Psychiatrist? No  Name of Therapist/Psychiatrist: No data recorded  Have You Been Recently Discharged From Any Office Practice or Programs? No  Explanation of Discharge From Practice/Program: No data recorded    CCA Screening Triage Referral  Assessment Type of Contact: Face-to-Face  Is this Initial or Reassessment? No data recorded Date Telepsych consult ordered in [facility]:  No data recorded Time Telepsych consult ordered in [facility]:  No data recorded  Patient Reported Information Reviewed? No data recorded Patient Left Without Being Seen? No data  recorded Reason for Not Completing Assessment: No data recorded  Collateral Involvement: No data recorded  Does Patient Have a Court Appointed Legal Guardian? No data recorded Name and Contact of Legal Guardian: No data recorded If Minor and Not Living with Parent(s), Who has Custody? No data recorded Is CPS involved or ever been involved? Never  Is APS involved or ever been involved? Never   Patient Determined To Be At Risk for Harm To Self or Others Based on Review of Patient Reported Information or Presenting Complaint? No  Method: No data recorded Availability of Means: No data recorded Intent: No data recorded Notification Required: No data recorded Additional Information for Danger to Others Potential: No data recorded Additional Comments for Danger to Others Potential: No data recorded Are There Guns or Other Weapons in Your Home? No  Types of Guns/Weapons: No data recorded Are These Weapons Safely Secured?                            No data recorded Who Could Verify You Are Able To Have These Secured: No data recorded Do You Have any Outstanding Charges, Pending Court Dates, Parole/Probation? No data recorded Contacted To Inform of Risk of Harm To Self or Others: No data recorded  Location of Assessment: [facility] ED   Does Patient Present under Involuntary Commitment? No  IVC Papers Initial File Date: No data recorded  [place] of Residence: [place]   Patient Currently Receiving the Following Services: No data recorded  Determination of Need: Emergent (2 hours)   Options For Referral: No data recorded    CCA Biopsychosocial Intake/Chief Complaint:  No data recorded Current Symptoms/Problems: No data recorded  Patient Reported Schizophrenia/Schizoaffective Diagnosis in Past: -- (Unknown)   Strengths: Patient is able to communicate their needs  Preferences: No data recorded Abilities: No data recorded  Type of Services Patient Feels are Needed: No data  recorded  Initial Clinical Notes/Concerns: No data recorded  Mental Health Symptoms Depression:   Change in energy/activity; Difficulty Concentrating; Fatigue; Hopelessness   Duration of Depressive symptoms:  Greater than two weeks   Mania:   None   Anxiety:    Difficulty concentrating; Restlessness; Worrying   Psychosis:   Hallucinations   Duration of Psychotic symptoms:  Greater than six months   Trauma:   None   Obsessions:   None   Compulsions:   None   Inattention:   None   Hyperactivity/Impulsivity:   None   Oppositional/Defiant Behaviors:   None   Emotional Irregularity:   None   Other Mood/Personality Symptoms:  No data recorded   Mental Status Exam Appearance and self-care  Stature:   Average   Weight:   Average weight   Clothing:   Casual   Grooming:   Normal   Cosmetic use:   None   Posture/gait:   Normal   Motor activity:   Not Remarkable   Sensorium  Attention:   Normal   Concentration:   Normal   Orientation:   X5   Recall/memory:   Normal   Affect and Mood  Affect:   Appropriate   Mood:   Depressed   Relating  Eye contact:   Normal   Facial expression:   Responsive   Attitude toward examiner:   Cooperative   Thought and Language  Speech flow:  Clear and Coherent   Thought content:   Appropriate to Mood and Circumstances   Preoccupation:   None   Hallucinations:   Auditory; Visual   Organization:  No data recorded  [employer] of Knowledge:   Fair   Intelligence:   Average   Abstraction:   Functional   Judgement:   Good   Reality Testing:   Adequate   Insight:   Good   Decision Making:   Normal   Social Functioning  Social Maturity:   Responsible   Social Judgement:   Normal   Stress  Stressors:   Illness   Coping Ability:   Contractor Deficits:   None   Supports:   Support needed     Religion: Religion/Spirituality Are You A  Religious Person?: No  Leisure/Recreation: Leisure / Recreation Do You Have Hobbies?: No  Exercise/Diet: Exercise/Diet Do You Exercise?: No Have You Gained or Lost A Significant Amount of Weight in the Past Six Months?: No Do You Follow a Special Diet?: No Do You Have Any Trouble Sleeping?: Yes Explanation of Sleeping Difficulties: Patient reports poor sleep   CCA Employment/Education Employment/Work Situation: Employment / Work Systems developer: On disability Why is Patient on Disability: Mental Health How Long has Patient Been on Disability: Unknown Has Patient ever Been in the [employer]?: No  Education: Education Is Patient Currently Attending School?: No Did You Have An Individualized Education Program (IIEP): No Did You Have Any Difficulty At School?: No Patient's Education Has Been Impacted by Current Illness: No   CCA Family/Childhood History Family and Relationship History: Family history Marital status: Single Does patient have children?: No  Childhood History:  Childhood History Did patient suffer any verbal/emotional/physical/sexual abuse as a child?: No Did patient suffer from severe childhood neglect?: No Has patient ever been sexually abused/assaulted/raped as an adolescent or adult?: No Was the patient ever a victim of a crime or a disaster?: No Witnessed domestic violence?: No Has patient been affected by domestic violence as an adult?: No  Child/Adolescent Assessment:     CCA Substance Use Alcohol/Drug Use: Alcohol / Drug Use Pain Medications: see mar Prescriptions: see mar Over the Counter: see mar History of alcohol / drug use?: Yes Substance #1 Name of Substance 1: marijuana                       ASAM's:  Six Dimensions of Multidimensional Assessment  Dimension 1:  Acute Intoxication and/or Withdrawal Potential:      Dimension 2:  Biomedical Conditions and Complications:      Dimension 3:  Emotional,  Behavioral, or Cognitive Conditions and Complications:     Dimension 4:  Readiness to Change:     Dimension 5:  Relapse, Continued use, or Continued Problem Potential:     Dimension 6:  Recovery/Living Environment:     ASAM Severity Score:    ASAM Recommended Level of Treatment:     Substance use Disorder (SUD)    Recommendations for Services/Supports/Treatments:    DSM5 Diagnoses: There are no problems to display for this patient.   Patient Centered Plan: Patient is on the following Treatment Plan(s):  Depression   Referrals to Alternative Service(s): Referred to Alternative Service(s):   Place:   Date:   Time:  Referred to Alternative Service(s):   Place:   Date:   Time:    Referred to Alternative Service(s):   Place:   Date:   Time:    Referred to Alternative Service(s):   Place:   Date:   Time:      '@[facility]COLLABOFCARE'$ @  [employer], LCAS-A

## 2022-07-06 NOTE — Progress Notes (Signed)
62 years old male patient admitted with major depressive disorder with suicidal ideations.Patient states " I have severe depression with suicidal ideation all the time. My depression is building over the years. I am taking medicines but it is not working now." Patient contracts for safety. Patient positive for hearing his bother's voices " kill yourself ". No VH at this time. Patient live my himself. Patient on disability. Patient verbalized transportation problem. Skin assessment and body search done with no contraband found.Oriented to unit and made comfortable in room. Support and encouragement given.

## 2022-07-06 NOTE — ED Notes (Signed)
Pt to room to speak with telepsych at this time

## 2022-07-06 NOTE — Group Note (Signed)
Date:  07/06/2022 Time:  9:31 PM  Group Topic/Focus:  Wrap-Up Group:   The focus of this group is to help patients review their daily goal of treatment and discuss progress on daily workbooks.    Participation Level:  Active  Participation Quality:  Appropriate  Affect:  Appropriate  Cognitive:  Alert and Appropriate  Insight: Appropriate  Engagement in Group:  Developing/Improving  Modes of Intervention:  Socialization  Additional Comments:     [name] 07/06/2022, 9:31 PM

## 2022-07-06 NOTE — ED Notes (Signed)
Attempted to give report to [facility] in the BMU, and was told that they are getting 3 admissions, "none of them are here" and they will need to call me back. Gave Hope the number for the BHU.

## 2022-07-06 NOTE — ED Notes (Signed)
Accepted to BMU-306 waiting to go down to room

## 2022-07-06 NOTE — ED Notes (Signed)
Pt awake, asking for timeline of when [facility] team will assess him. Pt educated on plan and timeline

## 2022-07-06 NOTE — BH Assessment (Signed)
This Clinical research associate signed and obtained the voluntary consent form from the patient. Attempts were made to locate the electronic voluntary consent but attempts were unsuccessful. Voluntary consent form has been faxed to [facility].

## 2022-07-06 NOTE — BH Assessment (Signed)
Patient is to be admitted to [facility] today 07/06/22 by Dr.  Reita May .  Attending Physician will be Dr.  Reita May .   Patient has been assigned to room 306, by [facility] Charge Nurse Gigi.     ER staff is aware of the admission: Glenda, ER Secretary   Dr. Modesto Charon, ER MD  Nicole Cella, Patient's Nurse

## 2022-07-06 NOTE — Consult Note (Signed)
Telepsych Consultation   Reason for Consult:  Suicidal ideation, depression Referring Physician:  Shaune Pollack Location of Patient: [facility] Location of Provider: Other: [facility]  Patient Identification: [name] MRN:  [id number] Principal Diagnosis: MDD, severe, recurrent with psychotic features Diagnosis:  MDD severe, recurrent with psychotic features, Suicidal ideation, Command auditory hallucinations, Visual hallucinations.  Total Time spent with patient: 20 minutes  Subjective:   [name] is a 62 y.o. male patient admitted with psychiatric history of Bipolar 1 depression, anxiety, PTSD and MDD who presented voluntarily to [facility] 07/06/22 with complaints of SI for several days with a plan to "hang himself" and command auditory hallucinations.   HPI:  On evaluation, patient is alert, oriented x 3, and cooperative. Speech is clear, normal rate and coherent. Pt appears in hospital scrubs. Eye contact is good. Mood is anxious and depressed, affect is flat and congruent with mood. Thought process is coherent and thought content is WDL. Pt endorses SI with plan, denies HI, endorses AVH with command voices. There is no objective indication that the patient is responding to internal stimuli. No delusions elicited during this assessment.    Patient endorses being depressed with suicidal thoughts, stating   " I don't know why, I'm just depressed and its been ongoing for the past 4-5 months, but I have been depressed for a while, it just got worse". Patient reports worsening depressive symptoms and increased suicidal ideations with plans to hang himself.   Patient reports family history of completed suicides by his first brother who shot himself in 30 and his other brother burned himself in front of him in 2018.  Patient also endorsed history of cutting/self harm, with last attempt 5 months ago, which he sought professional help for. Patient endorses depressive symptoms, including low  mood-constant state of wanting to die, constantly depressed, poor sleep, loss of interest in pleasurable activities, feelings of loneliness, feelings of hopelessness, problems with energy, problems with concentration, appetite disturbance and recurrent suicidal ideations.    Patient identifies his current stressors as, "long time alone, I don't need to be alone, I live with somebody but I'm always alone".   Patient reports he is on medications " which are not helping", Haldol 5 mg BID, started 2 months ago and Doxepin 100 mg at bedtime started same time two months ago.  Patient endorses command auditory hallucinations all the time with the voices telling him to "kill yourself".  Patient also endorsed visual hallucinations which he described as "not as bad as it used to be, but I see my dead brother, and I saw him kill himself".  Patient endorses marijuana use "once or twice a week".  Patient denies other illicit substance use.  Patient denies access or means to firearms. Patient reports he is currently on disability. Patient reports he has no support system , and "all my family is dead". Patient endorses history of 3-4 inpatient psychiatric hospitalizations, with the most recent which he reports at [facility] "2-3 months ago.   Support, encouragement, and reassurance provided stressors.  Patient is provided with opportunity for questions.  Discussed recommendation for inpatient psychiatric admission for stabilization and treatment. Discussed inpatient milieu and expectations.  Patient is voluntary and in agreement. The patient meets criteria for inpatient psychiatric admission due to several risk factors such as actively suicidal with plan, family history of suicides (2 brothers), age, sex, worsening depressive symptoms, lack of social support system and history of prior inpatient psychiatric hospitalization. Patient is a danger to himself  and cannot be safely discharged at this time, and will benefit  from inpatient psychiatric treatment.  Past Psychiatric History: Bipolar 1 depression, Anxiety, PTSD.  Risk to Self:  Y Risk to Others:  N Prior Inpatient Therapy:  Y Prior Outpatient Therapy:  Y  Past Medical History:  Past Medical History:  Diagnosis Date   Anxiety    Bipolar 1 disorder, depressed (HCC)    Depression    Hypertension    History reviewed. No pertinent surgical history. Family History: History reviewed. No pertinent family history. Family Psychiatric  History: Suicide completion (2 brothers) Social History:  Social History   Substance and Sexual Activity  Alcohol Use None     Social History   Substance and Sexual Activity  Drug Use Not on file    Social History   Socioeconomic History   Marital status: Single    Spouse name: Not on file   Number of children: Not on file   Years of education: Not on file   Highest education level: Not on file  Occupational History   Not on file  Tobacco Use   Smoking status: Not on file   Smokeless tobacco: Not on file  Substance and Sexual Activity   Alcohol use: Not on file   Drug use: Not on file   Sexual activity: Not on file  Other Topics Concern   Not on file  Social History Narrative   Not on file   Social Determinants of Health   Financial Resource Strain: Not on file  Food Insecurity: Not on file  Transportation Needs: Not on file  Physical Activity: Not on file  Stress: Not on file  Social Connections: Not on file   Additional Social History:    Allergies:  Not on File  Labs:  Results for orders placed or performed during the hospital encounter of 07/05/22 (from the past 48 hour(s))  Comprehensive metabolic panel     Status: Abnormal   Collection Time: 07/05/22  9:25 PM  Result Value Ref Range   Sodium 135 135 - 145 mmol/L   Potassium 3.9 3.5 - 5.1 mmol/L   Chloride 104 98 - 111 mmol/L   CO2 21 (L) 22 - 32 mmol/L   Glucose, Bld 104 (H) 70 - 99 mg/dL    Comment: Glucose reference  range applies only to samples taken after fasting for at least 8 hours.   BUN 22 (H) 6 - 20 mg/dL   Creatinine, Ser 8.41 0.61 - 1.24 mg/dL   Calcium 9.6 8.9 - 32.4 mg/dL   Total Protein 7.5 6.5 - 8.1 g/dL   Albumin 4.3 3.5 - 5.0 g/dL   AST 16 15 - 41 U/L   ALT 16 0 - 44 U/L   Alkaline Phosphatase 65 38 - 126 U/L   Total Bilirubin 0.6 0.3 - 1.2 mg/dL   GFR, Estimated >40 >10 mL/min    Comment: (NOTE) Calculated using the CKD-EPI Creatinine Equation (2021)    Anion gap 10 5 - 15    Comment: Performed at [facility], [address], [place], [place] [address]  Ethanol     Status: None   Collection Time: 07/05/22  9:25 PM  Result Value Ref Range   Alcohol, Ethyl (B) <10 <10 mg/dL    Comment: (NOTE) Lowest detectable limit for serum alcohol is 10 mg/dL.  For medical purposes only. Performed at [facility], [address] [place], [place] [address]   Salicylate level  Status: Abnormal   Collection Time: 07/05/22  9:25 PM  Result Value Ref Range   Salicylate Lvl <7.0 (L) 7.0 - 30.0 mg/dL    Comment: Performed at [facility], [address] [place], [place] [address]  Acetaminophen level     Status: Abnormal   Collection Time: 07/05/22  9:25 PM  Result Value Ref Range   Acetaminophen (Tylenol), Serum <10 (L) 10 - 30 ug/mL    Comment: (NOTE) Therapeutic concentrations vary significantly. A range of 10-30 ug/mL  may be an effective concentration for many patients. However, some  are best treated at concentrations outside of this range. Acetaminophen concentrations >150 ug/mL at 4 hours after ingestion  and >50 ug/mL at 12 hours after ingestion are often associated with  toxic reactions.  Performed at [facility], [address] [place], [place] [address]   cbc     Status: None   Collection Time: 07/05/22  9:25 PM  Result Value Ref Range   WBC 6.8 4.0 - 10.5 K/uL   RBC 4.92 4.22 - 5.81 MIL/uL   Hemoglobin 14.5 13.0 - 17.0 g/dL    HCT 78.2 95.6 - 21.3 %   MCV 88.0 80.0 - 100.0 fL   MCH 29.5 26.0 - 34.0 pg   MCHC 33.5 30.0 - 36.0 g/dL   RDW 08.6 57.8 - 46.9 %   Platelets 263 150 - 400 K/uL   nRBC 0.0 0.0 - 0.2 %    Comment: Performed at [facility], [address], [place], [place] [address]  Urine Drug Screen, Qualitative     Status: Abnormal   Collection Time: 07/05/22 10:50 PM  Result Value Ref Range   Tricyclic, Ur Screen POSITIVE (A) NONE DETECTED   Amphetamines, Ur Screen NONE DETECTED NONE DETECTED   MDMA (Ecstasy)Ur Screen NONE DETECTED NONE DETECTED   Cocaine Metabolite,Ur [place] NONE DETECTED NONE DETECTED   Opiate, Ur Screen NONE DETECTED NONE DETECTED   Phencyclidine (PCP) Ur S NONE DETECTED NONE DETECTED   Cannabinoid 50 Ng, Ur [place] POSITIVE (A) NONE DETECTED   Barbiturates, Ur Screen NONE DETECTED NONE DETECTED   Benzodiazepine, Ur Scrn NONE DETECTED NONE DETECTED   Methadone Scn, Ur NONE DETECTED NONE DETECTED    Comment: (NOTE) Tricyclics + metabolites, urine    Cutoff 1000 ng/mL Amphetamines + metabolites, urine  Cutoff 1000 ng/mL MDMA (Ecstasy), urine              Cutoff 500 ng/mL Cocaine Metabolite, urine          Cutoff 300 ng/mL Opiate + metabolites, urine        Cutoff 300 ng/mL Phencyclidine (PCP), urine         Cutoff 25 ng/mL Cannabinoid, urine                 Cutoff 50 ng/mL Barbiturates + metabolites, urine  Cutoff 200 ng/mL Benzodiazepine, urine              Cutoff 200 ng/mL Methadone, urine                   Cutoff 300 ng/mL  The urine drug screen provides only a preliminary, unconfirmed analytical test result and should not be used for non-medical purposes. Clinical consideration and professional judgment should be applied to any positive drug screen result due to possible interfering substances. A more specific alternate chemical method must be used in order to obtain a confirmed analytical result. Gas chromatography / mass spectrometry (GC/MS)  is the  preferred confirm atory method. Performed at [facility], [address] [place], [place] [address]     Medications:  Current Facility-Administered Medications  Medication Dose Route Frequency Provider Last Rate Last Admin   alum & mag hydroxide-simeth (MAALOX/MYLANTA) 200-200-20 MG/5ML suspension 30 mL  30 mL Oral Q6H PRN Shaune Pollack, MD       No current outpatient medications on file.    Musculoskeletal: Strength & Muscle Tone: within normal limits Gait & Station: normal Patient leans: N/A   Psychiatric Specialty Exam:  Presentation  General Appearance:  Other (comment) (In hospital scrubs)  Eye Contact: Good  Speech: Clear and Coherent  Speech Volume: Normal  Handedness: Right   Mood and Affect  Mood: Depressed  Affect: Congruent; Depressed; Flat   Thought Process  Thought Processes: Coherent  Descriptions of Associations:Intact  Orientation:Full (Time, Place and Person)  Thought Content:WDL  History of Schizophrenia/Schizoaffective disorder:No  Duration of Psychotic Symptoms:Greater than six months  Hallucinations:Hallucinations: Auditory Description of Auditory Hallucinations: Patient reports hearing voices tleling him to kill himself  Ideas of Reference:None  Suicidal Thoughts:Suicidal Thoughts: Yes, Active SI Active Intent and/or Plan: With Plan  Homicidal Thoughts:Homicidal Thoughts: No   Sensorium  Memory: Immediate Good  Judgment: Intact  Insight: Present   Executive Functions  Concentration: Good  Attention Span: Good  Recall: Good  Fund of Knowledge: Good  Language: Good   Psychomotor Activity  Psychomotor Activity: Psychomotor Activity: Normal   Assets  Assets: Communication Skills; Desire for Improvement   Sleep  Sleep: Sleep: Poor    Physical Exam: Physical Exam Constitutional:      General: He is not in acute distress.    Appearance: He is not diaphoretic.  HENT:      Head: Normocephalic.     Right Ear: External ear normal.     Left Ear: External ear normal.     Nose: No congestion.  Eyes:     General:        Right eye: No discharge.        Left eye: No discharge.  Pulmonary:     Effort: No respiratory distress.  Chest:     Chest wall: No tenderness.  Neurological:     Mental Status: He is alert and oriented to person, place, and time.  Psychiatric:        Attention and Perception: He perceives auditory and visual hallucinations.        Mood and Affect: Mood is depressed. Affect is flat.        Speech: Speech normal.        Behavior: Behavior is cooperative.        Thought Content: Thought content is not paranoid or delusional. Thought content includes suicidal ideation. Thought content does not include homicidal ideation. Thought content includes suicidal plan. Thought content does not include homicidal plan.        Cognition and Memory: Cognition and memory normal.    Review of Systems  Constitutional:  Negative for chills, diaphoresis and fever.  HENT:  Negative for congestion.   Eyes:  Negative for discharge.  Respiratory:  Negative for cough, shortness of breath and wheezing.   Cardiovascular:  Negative for chest pain and palpitations.  Gastrointestinal:  Negative for diarrhea, nausea and vomiting.  Neurological:  Negative for dizziness, seizures, loss of consciousness, weakness and headaches.  Psychiatric/Behavioral:  Positive for depression, hallucinations, substance abuse and suicidal ideas.    Blood pressure (!) 128/104, pulse 78, temperature 98.2  F (36.8 C), temperature source Oral, resp. rate 18, height 5\' 9"  (1.753 m), weight 79.8 kg, SpO2 98 %. Body mass index is 25.99 kg/m.  Treatment Plan Summary: Daily contact with patient to assess and evaluate symptoms and progress in treatment, Medication management, and Plan Inpatient psychiatric admission for stabilization and treatment.  Disposition: Recommend psychiatric  Inpatient admission when medically cleared. Supportive therapy provided about ongoing stressors.  This service was provided via telemedicine using a 2-way, interactive audio and video technology.  Names of all persons participating in this telemedicine service and their role in this encounter. Name: [name] Role: Patient  Name: Mancel Bale Role: NP  Name: Andee Poles Role: LCAS  Name: Estelle June Role: RN    Mancel Bale, NP 07/06/2022 5:18 AM

## 2022-07-06 NOTE — ED Notes (Signed)
Report to Michael, RN

## 2022-07-07 ENCOUNTER — Encounter: Payer: Self-pay | Admitting: Psychiatry

## 2022-07-07 DIAGNOSIS — F339 Major depressive disorder, recurrent, unspecified: Secondary | ICD-10-CM | POA: Diagnosis not present

## 2022-07-07 MED ORDER — OLANZAPINE 10 MG PO TABS
10.0000 mg | ORAL_TABLET | Freq: Every day | ORAL | Status: DC
  Administered 2022-07-07: 10 mg via ORAL
  Filled 2022-07-07: qty 1

## 2022-07-07 MED ORDER — RISPERIDONE 1 MG PO TABS
0.5000 mg | ORAL_TABLET | ORAL | Status: DC
  Administered 2022-07-07 – 2022-07-08 (×2): 0.5 mg via ORAL
  Filled 2022-07-07 (×2): qty 1

## 2022-07-07 MED ORDER — ENALAPRIL MALEATE 10 MG PO TABS
20.0000 mg | ORAL_TABLET | Freq: Every day | ORAL | Status: DC
  Administered 2022-07-07 – 2022-07-13 (×6): 20 mg via ORAL
  Filled 2022-07-07 (×3): qty 2
  Filled 2022-07-07: qty 1
  Filled 2022-07-07 (×4): qty 2

## 2022-07-07 MED ORDER — FLUOXETINE HCL 20 MG PO CAPS
20.0000 mg | ORAL_CAPSULE | Freq: Every day | ORAL | Status: DC
  Administered 2022-07-07 – 2022-07-13 (×7): 20 mg via ORAL
  Filled 2022-07-07 (×7): qty 1

## 2022-07-07 NOTE — Progress Notes (Signed)
Pt rates depression 1/10 and hopelessness 10/10.  Pt endorses passive SI.  Pt did not request any PRNs this shift.  Pt states that his goal is "medications" and will see the MD on day shift.  Pt dnies any physical complaints.  Continued monitoring for safety.  07/07/22 0600  Psych Admission Type (Psych Patients Only)  Admission Status Voluntary  Psychosocial Assessment  Patient Complaints Depression;Sadness  Eye Contact Fair  Facial Expression Sad  Affect Depressed  Speech Logical/coherent  Interaction Assertive  Appearance/Hygiene In scrubs  Behavior Characteristics Cooperative  Mood Depressed  Thought Process  Coherency WDL  Content WDL  Delusions None reported or observed  Perception Hallucinations  Hallucination Auditory  Judgment Impaired  Confusion None  Danger to Self  Current suicidal ideation? Passive  Agreement Not to Harm Self Yes  Danger to Others  Danger to Others None reported or observed
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
## 2022-07-07 NOTE — Progress Notes (Signed)
BP still 125/104. Dr. Marlou Porch aware with no new orders.
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
## 2022-07-07 NOTE — Progress Notes (Signed)
Pt had a fall at 1150. Documentation to follow. Dr. Marlou Porch aware.

## 2022-07-07 NOTE — Progress Notes (Signed)
At 1150 while in the dayroom to eat lunch, per MHT report, patient leaned his head back while sitting in the chair and he seemed "out of it," for a few seconds, MHT called for help, patient was assisted to the floor but did bump the left side of his head, no visible injuries, no swelling or bruising, ice pack given, see flowsheet for VS. Denies seizure history. Neuro assessment WDL. Dr. Marlou Porch and Darrick Grinder NP aware. Hilda Lias Charity fundraiser made aware, [facility] made aware.

## 2022-07-07 NOTE — Progress Notes (Signed)
Darrick Grinder NP made aware.  07/07/22 1150 07/07/22 1156 07/07/22 1213  Vital Signs  Temp 98 F (36.7 C)  --   --   Temp Source Oral  --   --   Pulse Rate (!) 107 (!) 107 95  Pulse Rate Source Monitor Monitor Monitor  Resp 19  --  17  BP (!) 146/106 (!) 146/106 (!) 144/95  BP Location Left Arm  --  Left Arm  BP Method Automatic Automatic Automatic  Patient Position (if appropriate) Sitting  --  Lying  Oxygen Therapy  SpO2 97 % 97 % 98 %  O2 Device Room Air  --  Room Air    07/07/22 1216 07/07/22 1218  Vital Signs  Temp 97.8 F (36.6 C) 97.8 F (36.6 C)  Temp Source Oral  --   Pulse Rate 99 (!) 103  Pulse Rate Source Monitor Monitor  Resp 17 17  BP (!) 126/114 (!) 125/109  BP Location Left Arm Left Arm  BP Method Automatic Automatic  Patient Position (if appropriate) Sitting Standing  Oxygen Therapy  SpO2 96 % 95 %  O2 Device Room [employer]
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
## 2022-07-07 NOTE — Group Note (Signed)
Recreation Therapy Group Note   Group Topic:Healthy Support Systems  Group Date: 07/07/2022 Start Time: 1000 End Time: 1055 Facilitators: Rosina Lowenstein, LRT, CTRS Location:  Craft Room  Group Description: Straw Bridge.  Patients were given 10 plastic drinking straws and an equal length of masking tape. Using the materials provided, patients were instructed to build a free-standing bridge-like structure to suspend an everyday item (ex: deck of cards) off the floor or table surface. All materials were required to be used in Secondary school teacher. LRT facilitated post-activity discussion reviewing the importance of having strong and healthy support systems in our lives. LRT discussed how the people in our lives serve as the tape and the deck of cards we placed on top of our straw structure are the stressors we face in daily life. LRT and pts discussed what happens in our life when things get too heavy for [place], and we don't have strong supports outside of the hospital. Pt shared 2 of their healthy supports aloud in the group.   Goal Area(s) Addressed:  Patient will identify 2 healthy supports in their life. Patient will identify skills to successfully complete activity. Patient will identify correlation of this activity to life post-discharge.  Patient will work on Product manager.   Affect/Mood: N/A   Participation Level: Did not attend    Clinical Observations/Individualized Feedback: Austyn did not attend group.  Plan: Continue to engage patient in RT group sessions 2-3x/week.   Rosina Lowenstein, LRT, CTRS 07/07/2022 11:31 AM

## 2022-07-08 DIAGNOSIS — F323 Major depressive disorder, single episode, severe with psychotic features: Secondary | ICD-10-CM

## 2022-07-08 MED ORDER — RISPERIDONE 1 MG PO TABS
1.0000 mg | ORAL_TABLET | ORAL | Status: DC
  Administered 2022-07-08 – 2022-07-13 (×10): 1 mg via ORAL
  Filled 2022-07-08 (×10): qty 1

## 2022-07-08 MED ORDER — TRAZODONE HCL 100 MG PO TABS
100.0000 mg | ORAL_TABLET | Freq: Every evening | ORAL | Status: DC | PRN
  Administered 2022-07-09 – 2022-07-12 (×4): 100 mg via ORAL
  Filled 2022-07-08 (×4): qty 1

## 2022-07-08 MED ORDER — CHLORPROMAZINE HCL 100 MG PO TABS
50.0000 mg | ORAL_TABLET | Freq: Every day | ORAL | Status: DC
  Administered 2022-07-08 – 2022-07-10 (×3): 50 mg via ORAL
  Filled 2022-07-08 (×3): qty 1

## 2022-07-08 MED ORDER — DIVALPROEX SODIUM 500 MG PO DR TAB
500.0000 mg | DELAYED_RELEASE_TABLET | Freq: Two times a day (BID) | ORAL | Status: DC
  Administered 2022-07-08 – 2022-07-13 (×10): 500 mg via ORAL
  Filled 2022-07-08 (×10): qty 1

## 2022-07-08 NOTE — Progress Notes (Addendum)
[facility] MD Progress Note  07/08/2022 12:06 PM [name]  MRN:  [id number] Subjective: [name] is seen on rounds.  He has a history of seizures since the 1980s but has been off his Depakote for last couple years.  He did have a seizure yesterday.  I spoke to him about restarting his Depakote and he said that that would be fine.  He was on 500 mg twice a day.  He currently does not have a doctor.  He endorses anhedonia, depression and difficulty sleeping.  Talked to him about starting different medications and he was agreeable. Principal Problem: Major depressive disorder, single episode, severe with psychotic features (HCC) Diagnosis: Principal Problem:   Major depressive disorder, single episode, severe with psychotic features (HCC)  Total Time spent with patient: 15 minutes  Past Psychiatric History: Depression  Past Medical History:  Past Medical History:  Diagnosis Date   Anxiety    Bipolar 1 disorder, depressed (HCC)    Depression    Hypertension    History reviewed. No pertinent surgical history. Family History: History reviewed. No pertinent family history. Family Psychiatric  History: Unremarkable Social History:  Social History   Substance and Sexual Activity  Alcohol Use None     Social History   Substance and Sexual Activity  Drug Use Not on file    Social History   Socioeconomic History   Marital status: Single    Spouse name: Not on file   Number of children: Not on file   Years of education: Not on file   Highest education level: Not on file  Occupational History   Not on file  Tobacco Use   Smoking status: Former    Types: Cigarettes   Smokeless tobacco: Never  Substance and Sexual Activity   Alcohol use: Not on file   Drug use: Not on file   Sexual activity: Not on file  Other Topics Concern   Not on file  Social History Narrative   Not on file   Social Determinants of Health   Financial Resource Strain: Not on file  Food Insecurity: Food  Insecurity Present (07/06/2022)   Hunger Vital Sign    Worried About Running Out of Food in the Last Year: Sometimes true    Ran Out of Food in the Last Year: Sometimes true  Transportation Needs: Unmet Transportation Needs (07/06/2022)   PRAPARE - Administrator, Civil Service (Medical): Yes    Lack of Transportation (Non-Medical): Yes  Physical Activity: Not on file  Stress: Not on file  Social Connections: Not on file   Additional Social History:                         Sleep: Poor  Appetite:  Fair  Current Medications: Current Facility-Administered Medications  Medication Dose Route Frequency Provider Last Rate Last Admin   acetaminophen (TYLENOL) tablet 650 mg  650 mg Oral Q6H PRN Lauree Chandler, NP   650 mg at 07/08/22 0811   alum & mag hydroxide-simeth (MAALOX/MYLANTA) 200-200-20 MG/5ML suspension 30 mL  30 mL Oral Q4H PRN Lauree Chandler, NP       chlorproMAZINE (THORAZINE) tablet 50 mg  50 mg Oral QHS Alek Borges Edward, DO       diphenhydrAMINE (BENADRYL) capsule 50 mg  50 mg Oral TID PRN Lauree Chandler, NP       Or   diphenhydrAMINE (BENADRYL) injection 50 mg  50 mg Intramuscular TID PRN  Lauree Chandler, NP       divalproex (DEPAKOTE) DR tablet 500 mg  500 mg Oral BID Sarina Ill, DO       enalapril (VASOTEC) tablet 20 mg  20 mg Oral Daily Lauree Chandler, NP   20 mg at 07/08/22 1610   FLUoxetine (PROZAC) capsule 20 mg  20 mg Oral Daily Lauree Chandler, NP   20 mg at 07/08/22 9604   haloperidol (HALDOL) tablet 5 mg  5 mg Oral TID PRN Lauree Chandler, NP       Or   haloperidol lactate (HALDOL) injection 5 mg  5 mg Intramuscular TID PRN Lauree Chandler, NP       hydrOXYzine (ATARAX) tablet 25 mg  25 mg Oral TID PRN Lauree Chandler, NP   25 mg at 07/07/22 2106   LORazepam (ATIVAN) tablet 2 mg  2 mg Oral TID PRN Lauree Chandler, NP   2 mg at 07/06/22 1653   Or   LORazepam (ATIVAN) injection 2 mg   2 mg Intramuscular TID PRN Lauree Chandler, NP       magnesium hydroxide (MILK OF MAGNESIA) suspension 30 mL  30 mL Oral Daily PRN Lauree Chandler, NP       risperiDONE (RISPERDAL) tablet 1 mg  1 mg Oral [id number] Eloise Mula Edward, DO       traZODone (DESYREL) tablet 100 mg  100 mg Oral QHS PRN Sarina Ill, DO        Lab Results: No results found for this or any previous visit (from the past 48 hour(s)).  Blood Alcohol level:  Lab Results  Component Value Date   ETH <10 07/05/2022    Metabolic Disorder Labs: No results found for: "HGBA1C", "MPG" No results found for: "PROLACTIN" No results found for: "CHOL", "TRIG", "HDL", "CHOLHDL", "VLDL", "LDLCALC"  Physical Findings: AIMS:  , ,  ,  ,    CIWA:    COWS:     Musculoskeletal: Strength & Muscle Tone: within normal limits Gait & Station: normal Patient leans: N/A  Psychiatric Specialty Exam:  Presentation  General Appearance:  Appropriate for Environment; Other (comment) (in hospital scrubs)  Eye Contact: Good  Speech: Clear and Coherent; Normal Rate  Speech Volume: Normal  Handedness: Right   Mood and Affect  Mood: Depressed; Anxious  Affect: Blunt   Thought Process  Thought Processes: Coherent; Goal Directed; Linear  Descriptions of Associations:Intact  Orientation:Full (Time, Place and Person)  Thought Content:Logical  History of Schizophrenia/Schizoaffective disorder:No  Duration of Psychotic Symptoms:Greater than six months  Hallucinations:Hallucinations: Other (comment) Description of Auditory Hallucinations: none currently, last experienced yesterday brothers' voices telling him to kill himself  Ideas of Reference:None  Suicidal Thoughts:Suicidal Thoughts: No (None currently)  Homicidal Thoughts:Homicidal Thoughts: No   Sensorium  Memory: Immediate Good  Judgment: Fair  Insight: Fair   Art therapist  Concentration: Good  Attention  Span: Good  Recall: Good  Fund of Knowledge: Good  Language: Good   Psychomotor Activity  Psychomotor Activity: Psychomotor Activity: Normal   Assets  Assets: Communication Skills; Desire for Improvement; Financial Resources/Insurance; Resilience; Housing   Sleep  Sleep: Sleep: Poor     Blood pressure (!) 144/91, pulse 86, temperature 97.6 F (36.4 C), temperature source Oral, resp. rate 16, height 5\' 9"  (1.753 m), weight 79.4 kg, SpO2 97 %. Body mass index is 25.84 kg/m.   Treatment Plan Summary: Daily contact with patient to assess and evaluate symptoms and  progress in treatment, Medication management, and Plan restart Depakote 500 mg twice a day.  Discontinue Zyprexa at bedtime and add Thorazine 50 mg at bedtime.  Change trazodone to 100 mg as needed for sleep.  Increase Risperdal to 1 mg twice a day.  Continue Prozac 20 mg/day.  Blood pressure remains high so we will change his Vasotec to Cozaar.  Sarina Ill, DO 07/08/2022, 12:06 PM

## 2022-07-08 NOTE — Plan of Care (Signed)
D- Patient alert and oriented. Patient presented in a sad, but pleasant mood on assessment reporting that he slept "poor" last night and had complaints of a headache. Patient rated his pain a "7/10", in which he requested PRN pain medication to help with relief. Patient endorsed hopelessness, depression and anxiety on his self-inventory stating "that's just the way I feel". Patient denied SI, HI, AVH at this time. Patient's goal for today is "getting out".  A- Scheduled medications administered to patient, per MD orders. Support and encouragement provided.  Routine safety checks conducted every 15 minutes.  Patient informed to notify staff with problems or concerns.  R- No adverse drug reactions noted. Patient contracts for safety at this time. Patient compliant with medications and treatment plan. Patient receptive, calm, and cooperative. Patient interacts well with others on the unit. Patient remains safe at this time.  Problem: Education: Goal: Knowledge of [facility] Education information/materials will improve Outcome: Not Progressing Goal: Emotional status will improve Outcome: Not Progressing Goal: Mental status will improve Outcome: Not Progressing Goal: Verbalization of understanding the information provided will improve Outcome: Not Progressing   Problem: Health Behavior/Discharge Planning: Goal: Identification of resources available to assist in meeting health care needs will improve Outcome: Not Progressing Goal: Compliance with treatment plan for underlying cause of condition will improve Outcome: Not Progressing   Problem: Education: Goal: Utilization of techniques to improve thought processes will improve Outcome: Not Progressing Goal: Knowledge of the prescribed therapeutic regimen will improve Outcome: Not Progressing   Problem: Activity: Goal: Interest or engagement in leisure activities will improve Outcome: Not Progressing Goal: Imbalance in normal  sleep/wake cycle will improve Outcome: Not Progressing   Problem: Coping: Goal: Coping ability will improve Outcome: Not Progressing Goal: Will verbalize feelings Outcome: Not Progressing   Problem: Health Behavior/Discharge Planning: Goal: Ability to make decisions will improve Outcome: Not Progressing Goal: Compliance with therapeutic regimen will improve Outcome: Not Progressing   Problem: Safety: Goal: Ability to disclose and discuss suicidal ideas will improve Outcome: Not Progressing Goal: Ability to identify and utilize support systems that promote safety will improve Outcome: Not Progressing   Problem: Self-Concept: Goal: Will verbalize positive feelings about self Outcome: Not Progressing Goal: Level of anxiety will decrease Outcome: Not Progressing

## 2022-07-08 NOTE — Progress Notes (Signed)
Patient had complaints of anxiety, mind-racing, and suicidal thoughts. PRN medication was given to patient to help with relief. Patient tolerated medication administration well, without any issues. Patient remains safe on the unit.
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
## 2022-07-08 NOTE — Progress Notes (Signed)
   07/07/22 2138  Vital Signs  Temp 97.6 F (36.4 C)  Temp Source Oral  Pulse Rate 90  Pulse Rate Source Monitor  Resp 18  BP 119/88  BP Location Left Arm  BP Method Automatic  Patient Position (if appropriate) Lying  Oxygen Therapy  SpO2 94 %  O2 Device Room Air   Patient complaint with medication ambulating on steady gait v/s WNL denies SI/HI/VH and verbally contracts for safety endorsing AH. Interacting well with Peers and Staff no adverse effects of medications noted. All fall protocol in place. Support and encouragement provided.

## 2022-07-08 NOTE — Group Note (Signed)
Date:  07/08/2022 Time:  10:02 AM  Group Topic/Focus:  Goals Group:   The focus of this group is to help patients establish daily goals to achieve during treatment and discuss how the patient can incorporate goal setting into their daily lives to aide in recovery.    Participation Level:  Did Not Attend   Lynelle Smoke [facility] 07/08/2022, 10:02 AM

## 2022-07-08 NOTE — Group Note (Signed)
Recreation Therapy Group Note   Group Topic:Leisure Education  Group Date: 07/08/2022 Start Time: 1000 End Time: 1105 Facilitators: Rosina Lowenstein, LRT, CTRS Location:  Day Room  Group Description: Leisure. Patients were given the option to choose from coloring, singing karaoke, or playing cards. LRT and pts discussed the meaning of leisure, the importance of participating in leisure during their free time/when they're outside of the hospital, as well as how our leisure interests can also serve as coping skills. Pt identified two leisure interests and shared with the group.   Goal Area(s) Addressed:  Patient will identify a current leisure interest.  Patient will learn the definition of "leisure". Patient will practice making a positive decision. Patient will have the opportunity to try a new leisure activity. Patient will communicate with peers and LRT.   Affect/Mood: N/A   Participation Level: Did not attend    Clinical Observations/Individualized Feedback: Rayniel did not attend group.  Plan: Continue to engage patient in RT group sessions 2-3x/week.   Rosina Lowenstein, LRT, CTRS 07/08/2022 11:49 AM

## 2022-07-08 NOTE — [facility] Suicide Risk Assessment (Signed)
[facility] INPATIENT:  Family/Significant Other Suicide Prevention Education  Suicide Prevention Education:  Patient Refusal for Family/Significant Other Suicide Prevention Education: The patient [name] has refused to provide written consent for family/significant other to be provided Family/Significant Other Suicide Prevention Education during admission and/or prior to discharge.  Physician notified.  SPE completed with pt, as pt refused to consent to family contact. SPI pamphlet provided to pt and pt was encouraged to share information with support network, ask questions, and talk about any concerns relating to SPE. Pt denies access to guns/firearms and verbalized understanding of information provided. Mobile Crisis information also provided to pt.   Harden Mo 07/08/2022, 3:51 PM

## 2022-07-08 NOTE — BH IP Treatment Plan (Signed)
Interdisciplinary Treatment and Diagnostic Plan Update  07/08/2022 Time of Session: 9:37 AM [name] MRN: [id number]  Principal Diagnosis: MDD (major depressive disorder)  Secondary Diagnoses: Principal Problem:   MDD (major depressive disorder)   Current Medications:  Current Facility-Administered Medications  Medication Dose Route Frequency Provider Last Rate Last Admin   acetaminophen (TYLENOL) tablet 650 mg  650 mg Oral Q6H PRN Lauree Chandler, NP   650 mg at 07/08/22 0811   alum & mag hydroxide-simeth (MAALOX/MYLANTA) 200-200-20 MG/5ML suspension 30 mL  30 mL Oral Q4H PRN Lauree Chandler, NP       diphenhydrAMINE (BENADRYL) capsule 50 mg  50 mg Oral TID PRN Lauree Chandler, NP       Or   diphenhydrAMINE (BENADRYL) injection 50 mg  50 mg Intramuscular TID PRN Lauree Chandler, NP       divalproex (DEPAKOTE) DR tablet 500 mg  500 mg Oral BID Sarina Ill, DO       enalapril (VASOTEC) tablet 20 mg  20 mg Oral Daily Lauree Chandler, NP   20 mg at 07/08/22 0811   FLUoxetine (PROZAC) capsule 20 mg  20 mg Oral Daily Lauree Chandler, NP   20 mg at 07/08/22 4098   haloperidol (HALDOL) tablet 5 mg  5 mg Oral TID PRN Lauree Chandler, NP       Or   haloperidol lactate (HALDOL) injection 5 mg  5 mg Intramuscular TID PRN Lauree Chandler, NP       hydrOXYzine (ATARAX) tablet 25 mg  25 mg Oral TID PRN Lauree Chandler, NP   25 mg at 07/07/22 2106   LORazepam (ATIVAN) tablet 2 mg  2 mg Oral TID PRN Lauree Chandler, NP   2 mg at 07/06/22 1653   Or   LORazepam (ATIVAN) injection 2 mg  2 mg Intramuscular TID PRN Lauree Chandler, NP       magnesium hydroxide (MILK OF MAGNESIA) suspension 30 mL  30 mL Oral Daily PRN Lauree Chandler, NP       OLANZapine (ZYPREXA) tablet 10 mg  10 mg Oral QHS Lauree Chandler, NP   10 mg at 07/07/22 2106   risperiDONE (RISPERDAL) tablet 0.5 mg  0.5 mg Oral BH-[id number] Sarina Ill, DO   0.5 mg at  07/08/22 1191   traZODone (DESYREL) tablet 50 mg  50 mg Oral QHS PRN Lauree Chandler, NP   50 mg at 07/07/22 2106   PTA Medications: Medications Prior to Admission  Medication Sig Dispense Refill Last Dose   enalapril (VASOTEC) 20 MG tablet Take 20 mg by mouth daily.   Past Week   FLUoxetine (PROZAC) 20 MG capsule Take 20 mg by mouth daily.   Past Week   OLANZapine (ZYPREXA) 10 MG tablet Take 10 mg by mouth at bedtime.   Past Week   traZODone (DESYREL) 50 MG tablet Take 50 mg by mouth at bedtime.   Past Week   traMADol (ULTRAM) 50 MG tablet Take 50 mg by mouth every 6 (six) hours as needed for moderate pain. (Patient not taking: Reported on 07/06/2022)   Not Taking    Patient Stressors: Financial difficulties   Traumatic event    Patient Strengths: Average or above average intelligence  Communication skills  Motivation for treatment/growth  Physical Health   Treatment Modalities: Medication Management, Group therapy, Case management,  1 to 1 session with clinician, Psychoeducation, Recreational therapy.   Physician Treatment Plan  for Primary Diagnosis: MDD (major depressive disorder) Long Term Goal(s): Improvement in symptoms so as ready for discharge   Short Term Goals: Ability to verbalize feelings will improve Ability to identify and develop effective coping behaviors will improve  Medication Management: Evaluate patient's response, side effects, and tolerance of medication regimen.  Therapeutic Interventions: 1 to 1 sessions, Unit Group sessions and Medication administration.  Evaluation of Outcomes: Not Met  Physician Treatment Plan for Secondary Diagnosis: Principal Problem:   MDD (major depressive disorder)  Long Term Goal(s): Improvement in symptoms so as ready for discharge   Short Term Goals: Ability to verbalize feelings will improve Ability to identify and develop effective coping behaviors will improve     Medication Management: Evaluate patient's  response, side effects, and tolerance of medication regimen.  Therapeutic Interventions: 1 to 1 sessions, Unit Group sessions and Medication administration.  Evaluation of Outcomes: Not Met   RN Treatment Plan for Primary Diagnosis: MDD (major depressive disorder) Long Term Goal(s): Knowledge of disease and therapeutic regimen to maintain health will improve  Short Term Goals: Ability to remain free from injury will improve, Ability to verbalize frustration and anger appropriately will improve, Ability to demonstrate self-control, Ability to participate in decision making will improve, Ability to verbalize feelings will improve, Ability to disclose and discuss suicidal ideas, Ability to identify and develop effective coping behaviors will improve, and Compliance with prescribed medications will improve  Medication Management: RN will administer medications as ordered by provider, will assess and evaluate patient's response and provide education to patient for prescribed medication. RN will report any adverse and/or side effects to prescribing provider.  Therapeutic Interventions: 1 on 1 counseling sessions, Psychoeducation, Medication administration, Evaluate responses to treatment, Monitor vital signs and CBGs as ordered, Perform/monitor CIWA, COWS, AIMS and Fall Risk screenings as ordered, Perform wound care treatments as ordered.  Evaluation of Outcomes: Not Met   LCSW Treatment Plan for Primary Diagnosis: MDD (major depressive disorder) Long Term Goal(s): Safe transition to appropriate next level of care at discharge, Engage patient in therapeutic group addressing interpersonal concerns.  Short Term Goals: Engage patient in aftercare planning with referrals and resources, Increase social support, Increase ability to appropriately verbalize feelings, Increase emotional regulation, Facilitate acceptance of mental health diagnosis and concerns, Facilitate patient progression through stages of  change regarding substance use diagnoses and concerns, and Increase skills for wellness and recovery  Therapeutic Interventions: Assess for all discharge needs, 1 to 1 time with Social worker, Explore available resources and support systems, Assess for adequacy in community support network, Educate family and significant other(s) on suicide prevention, Complete Psychosocial Assessment, Interpersonal group therapy.  Evaluation of Outcomes: Not Met   Progress in Treatment: Attending groups: Yes. and No. Participating in groups: Yes. and No. Taking medication as prescribed: Yes. Toleration medication: Yes. Family/Significant other contact made: No, will contact:  CSW will contact if given permission  Patient understands diagnosis: Yes. Discussing patient identified problems/goals with staff: Yes. Medical problems stabilized or resolved: Yes. Denies suicidal/homicidal ideation: No. Issues/concerns per patient self-inventory: No. Other: None   New problem(s) identified: No, Describe:  None identified  New Short Term/Long Term Goal(s): elimination of symptoms of psychosis, medication management for mood stabilization; elimination of SI thoughts; development of comprehensive mental wellness plan.   Patient Goals:  "Hopefully get on some medication that will help me"  Discharge Plan or Barriers: CSW will assist with appropriate discharge planning   Reason for Continuation of Hospitalization: Depression Hallucinations Medication stabilization Suicidal  ideation  Estimated Length of Stay: 1 to 7 days   Last 3 [place] Suicide Severity Risk Score: Flowsheet Row Admission (Current) from 07/06/2022 in [facility] INPATIENT [facility]  C-SSRS RISK CATEGORY No Risk       Last PHQ 2/9 Scores:     No data to display          Scribe for Treatment Team: [name], Theresia Majors 07/08/2022 11:19 AM

## 2022-07-09 DIAGNOSIS — F323 Major depressive disorder, single episode, severe with psychotic features: Secondary | ICD-10-CM | POA: Diagnosis not present

## 2022-07-09 MED ORDER — GABAPENTIN 300 MG PO CAPS: 300.0000 mg | ORAL_CAPSULE | Freq: Three times a day (TID) | ORAL | Status: DC

## 2022-07-09 MED ORDER — IBUPROFEN 600 MG PO TABS: 600.0000 mg | ORAL_TABLET | Freq: Four times a day (QID) | ORAL | Status: DC | PRN

## 2022-07-09 MED ORDER — METOPROLOL SUCCINATE ER 25 MG PO TB24
50.0000 mg | ORAL_TABLET | Freq: Every day | ORAL | Status: DC
  Administered 2022-07-09 – 2022-07-13 (×5): 50 mg via ORAL
  Filled 2022-07-09 (×5): qty 2

## 2022-07-09 NOTE — Group Note (Signed)
Date:  07/09/2022 Time:  6:41 PM  Group Topic/Focus:  Self Care:   The focus of this group is to help patients understand the importance of self-care in order to improve or restore emotional, physical, spiritual, interpersonal, and financial health. Gratitude assertiveness.     Participation Level:  Minimal  Participation Quality:  Appropriate  Affect:  Appropriate  Cognitive:  Appropriate  Insight: Appropriate  Engagement in Group:  Engaged  Modes of Intervention:  Activity and Discussion  Additional Comments:    Doug Sou 07/09/2022, 6:41 PM

## 2022-07-09 NOTE — Group Note (Signed)
LCSW Group Therapy Note  Group Date: 07/09/2022 Start Time: 1315 End Time: 1355   Type of Therapy and Topic:  Group Therapy - Coping Skills  Participation Level:  Active   Description of Group The focus of this group was to determine what unhealthy coping techniques typically are used by group members and what healthy coping techniques would be helpful in coping with various problems. Patients were guided in becoming aware of the differences between healthy and unhealthy coping techniques. Patients were asked to identify 2-3 healthy coping skills they would like to learn to use more effectively.  Therapeutic Goals Patients learned that coping is what human beings do all day long to deal with various situations in their lives Patients defined and discussed healthy vs unhealthy coping techniques Patients identified their preferred coping techniques and identified whether these were healthy or unhealthy Patients determined 2-3 healthy coping skills they would like to become more familiar with and use more often. Patients provided support and ideas to each other   Summary of Patient Progress:  The patient attended group. The patient stated that he would rather explore the ocean than space. The was active during the croup discussion and understood the different categories of coping skills.     Marshell Levan, LCSWA 07/09/2022  2:29 PM

## 2022-07-09 NOTE — Group Note (Signed)
Date:  07/09/2022 Time:  10:52 AM  Group Topic/Focus:  Music Therapy- Outside group time, working and building team skills by playing basketball  and other group activities.    Participation Level:  Did Not Attend   Doug Sou 07/09/2022, 10:52 AM

## 2022-07-09 NOTE — Progress Notes (Signed)
The patient endorsed some improvements in his overall condition.  He spent the majority of time in his room, but did attend evening group.  Medications were accepted as ordered.  Vague suicidal ideation continues.  No unsafe behaviors noted.

## 2022-07-09 NOTE — Progress Notes (Signed)
D- Patient alert and oriented x 4. Affect anxious/mood congruent. Denies SI/ HI/ AVH. Patient denies pain. He endorses depression and anxiety. BP and pulse elevated MD notified. Metoprolol order and administered. PM BP= 111/73. His goals today are working on "getting out". PRN Hydroxyzine 25 mg administered for c/o pain. A- Scheduled and PRN medications administered to patient, per MD orders. Support and encouragement provided.  Routine safety checks conducted every 15 minutes without incident.  Patient informed to notify staff with problems or concerns and verbalizes understanding. R- No adverse drug reactions noted.  Patient compliant with medications and treatment plan. Patient is isolative to him room for most of the shift. Patient contracts for safety and  remains safe on the unit at this time.

## 2022-07-09 NOTE — Progress Notes (Signed)
The patient rested in bed for the evening, but was pleasant when he was provided evening medications.  [name] continues to endorse passive suicidal ideation with severe symptoms of depression/anxiety.  The patient ate a snack before bedtime and slept until morning.

## 2022-07-09 NOTE — Progress Notes (Signed)
[facility] MD Progress Note  07/09/2022 2:42 PM Martino Bajor  MRN:  [id number] Subjective: [name] is seen on rounds.  I started him on Thorazine last night and states that he slept better.  He said that his mood is improving.  He has a history of seizures and was started back on Depakote.  His heart rate is been up so we will start him on some Toprol-XL.  He denies any side effects from his medications.  Nurses report no issues. Principal Problem: Major depressive disorder, single episode, severe with psychotic features (HCC) Diagnosis: Principal Problem:   Major depressive disorder, single episode, severe with psychotic features (HCC)  Total Time spent with patient: 15 minutes  Past Psychiatric History: Depression  Past Medical History:  Past Medical History:  Diagnosis Date   Anxiety    Bipolar 1 disorder, depressed (HCC)    Depression    Hypertension    History reviewed. No pertinent surgical history. Family History: History reviewed. No pertinent family history. Family Psychiatric  History: Unremarkable Social History:  Social History   Substance and Sexual Activity  Alcohol Use None     Social History   Substance and Sexual Activity  Drug Use Not on file    Social History   Socioeconomic History   Marital status: Single    Spouse name: Not on file   Number of children: Not on file   Years of education: Not on file   Highest education level: Not on file  Occupational History   Not on file  Tobacco Use   Smoking status: Former    Types: Cigarettes   Smokeless tobacco: Never  Substance and Sexual Activity   Alcohol use: Not on file   Drug use: Not on file   Sexual activity: Not on file  Other Topics Concern   Not on file  Social History Narrative   Not on file   Social Determinants of Health   Financial Resource Strain: Not on file  Food Insecurity: Food Insecurity Present (07/06/2022)   Hunger Vital Sign    Worried About Running Out of Food in the Last Year:  Sometimes true    Ran Out of Food in the Last Year: Sometimes true  Transportation Needs: Unmet Transportation Needs (07/06/2022)   PRAPARE - Administrator, Civil Service (Medical): Yes    Lack of Transportation (Non-Medical): Yes  Physical Activity: Not on file  Stress: Not on file  Social Connections: Not on file   Additional Social History:                         Sleep: Good  Appetite:  Good  Current Medications: Current Facility-Administered Medications  Medication Dose Route Frequency Provider Last Rate Last Admin   acetaminophen (TYLENOL) tablet 650 mg  650 mg Oral Q6H PRN Lauree Chandler, NP   650 mg at 07/08/22 0811   alum & mag hydroxide-simeth (MAALOX/MYLANTA) 200-200-20 MG/5ML suspension 30 mL  30 mL Oral Q4H PRN Lauree Chandler, NP       chlorproMAZINE (THORAZINE) tablet 50 mg  50 mg Oral QHS Sarina Ill, DO   50 mg at 07/08/22 2138   diphenhydrAMINE (BENADRYL) capsule 50 mg  50 mg Oral TID PRN Lauree Chandler, NP   50 mg at 07/08/22 1226   Or   diphenhydrAMINE (BENADRYL) injection 50 mg  50 mg Intramuscular TID PRN Lauree Chandler, NP       divalproex (  DEPAKOTE) DR tablet 500 mg  500 mg Oral BID Sarina Ill, DO   500 mg at 07/09/22 1610   enalapril (VASOTEC) tablet 20 mg  20 mg Oral Daily Lauree Chandler, NP   20 mg at 07/09/22 0919   FLUoxetine (PROZAC) capsule 20 mg  20 mg Oral Daily Lauree Chandler, NP   20 mg at 07/09/22 9604   haloperidol (HALDOL) tablet 5 mg  5 mg Oral TID PRN Lauree Chandler, NP   5 mg at 07/08/22 1226   Or   haloperidol lactate (HALDOL) injection 5 mg  5 mg Intramuscular TID PRN Lauree Chandler, NP       hydrOXYzine (ATARAX) tablet 25 mg  25 mg Oral TID PRN Lauree Chandler, NP   25 mg at 07/08/22 1226   ibuprofen (ADVIL) tablet 600 mg  600 mg Oral Q6H PRN Sarina Ill, DO       LORazepam (ATIVAN) tablet 2 mg  2 mg Oral TID PRN Lauree Chandler, NP   2  mg at 07/08/22 1226   Or   LORazepam (ATIVAN) injection 2 mg  2 mg Intramuscular TID PRN Lauree Chandler, NP       magnesium hydroxide (MILK OF MAGNESIA) suspension 30 mL  30 mL Oral Daily PRN Lauree Chandler, NP       metoprolol succinate (TOPROL-XL) 24 hr tablet 50 mg  50 mg Oral Daily Sarina Ill, DO   50 mg at 07/09/22 1010   risperiDONE (RISPERDAL) tablet 1 mg  1 mg Oral BH-q8a4p Sarina Ill, DO   1 mg at 07/09/22 5409   traZODone (DESYREL) tablet 100 mg  100 mg Oral QHS PRN Sarina Ill, DO        Lab Results: No results found for this or any previous visit (from the past 48 hour(s)).  Blood Alcohol level:  Lab Results  Component Value Date   ETH <10 07/05/2022    Metabolic Disorder Labs: No results found for: "HGBA1C", "MPG" No results found for: "PROLACTIN" No results found for: "CHOL", "TRIG", "HDL", "CHOLHDL", "VLDL", "LDLCALC"  Physical Findings: AIMS:  , ,  ,  ,    CIWA:    COWS:     Musculoskeletal: Strength & Muscle Tone: within normal limits Gait & Station: normal Patient leans: N/A  Psychiatric Specialty Exam:  Presentation  General Appearance:  Appropriate for Environment; Other (comment) (in hospital scrubs)  Eye Contact: Good  Speech: Clear and Coherent; Normal Rate  Speech Volume: Normal  Handedness: Right   Mood and Affect  Mood: Depressed; Anxious  Affect: Blunt   Thought Process  Thought Processes: Coherent; Goal Directed; Linear  Descriptions of Associations:Intact  Orientation:Full (Time, Place and Person)  Thought Content:Logical  History of Schizophrenia/Schizoaffective disorder:No  Duration of Psychotic Symptoms:Greater than six months  Hallucinations:No data recorded Ideas of Reference:None  Suicidal Thoughts:No data recorded Homicidal Thoughts:No data recorded  Sensorium  Memory: Immediate Good  Judgment: Fair  Insight: Fair   Art therapist   Concentration: Good  Attention Span: Good  Recall: Good  Fund of Knowledge: Good  Language: Good   Psychomotor Activity  Psychomotor Activity:No data recorded  Assets  Assets: Communication Skills; Desire for Improvement; Financial Resources/Insurance; Resilience; Housing   Sleep  Sleep:No data recorded    Blood pressure (!) 130/100, pulse (!) 141, temperature 98 F (36.7 C), temperature source Oral, resp. rate 18, height 5\' 9"  (1.753 m), weight 79.4 kg, SpO2 97 %.  Body mass index is 25.84 kg/m.   Treatment Plan Summary: Daily contact with patient to assess and evaluate symptoms and progress in treatment, Medication management, and Plan continue current medications.  Start Toprol-XL 50 mg/day.  Deondra Labrador Tresea Mall, DO 07/09/2022, 2:42 PM

## 2022-07-09 NOTE — Group Note (Signed)
Date:  07/09/2022 Time:  10:29 PM  Group Topic/Focus:  Wrap-Up Group:   The focus of this group is to help patients review their daily goal of treatment and discuss progress on daily workbooks.    Participation Level:  Active  Participation Quality:  Appropriate  Affect:  Appropriate  Cognitive:  Alert  Insight: Appropriate  Engagement in Group:  Limited  Modes of Intervention:  Activity  Additional Comments:     Maglione,Dejaun Vidrio E 07/09/2022, 10:29 PM

## 2022-07-10 DIAGNOSIS — F323 Major depressive disorder, single episode, severe with psychotic features: Secondary | ICD-10-CM | POA: Diagnosis not present

## 2022-07-10 NOTE — Group Note (Signed)
Date:  07/10/2022 Time:  10:09 AM  Group Topic/Focus:  Activity Group:  The focus of this group is to encourage patients to come outside to the courtyard to assist with their physical and mental wellbeing.    Participation Level:  Did Not Attend   Naomee Nowland M Mylinda Brook 07/10/2022, 10:09 AM  

## 2022-07-10 NOTE — Group Note (Signed)
Date:  07/10/2022 Time:  3:50 PM  Group Topic/Focus:  Activity Group:  The purpose of this group is to encourage patients to come outside to the courtyard to get some fresh air for the support of their physical and mental wellbeing.    Participation Level:  Did Not Attend   [name] M Caris Cerveny 07/10/2022, 3:50 PM  

## 2022-07-10 NOTE — Progress Notes (Signed)
   07/10/22 0800  Psych Admission Type (Psych Patients Only)  Admission Status Voluntary  Psychosocial Assessment  Patient Complaints Anxiety  Eye Contact Fair  Facial Expression Anxious  Affect Blunted  Speech Logical/coherent  Interaction Isolative  Motor Activity Slow  Appearance/Hygiene Unremarkable  Behavior Characteristics Appropriate to situation  Mood Pleasant  Aggressive Behavior  Effect No apparent injury  Thought Process  Coherency WDL  Content WDL  Delusions None reported or observed  Perception WDL  Hallucination None reported or observed  Judgment Impaired  Confusion None  Danger to Self  Current suicidal ideation? Denies  Agreement Not to Harm Self Yes  Description of Agreement verbal  Danger to Others  Danger to Others None reported or observed   Pt is appropriate. Pt is isolative to room. Pt is adherent with all scheduled medications. Pt has no signs of distress or injury.

## 2022-07-10 NOTE — Progress Notes (Signed)
[facility] MD Progress Note  07/10/2022 2:30 PM [name]  MRN:  [id number] Subjective: [name] is seen on rounds.  He states that he is feeling better.  His blood pressure and vital signs have improved.  He states that his mood is getting better.  He likes the medication combination that he is on. Principal Problem: Major depressive disorder, single episode, severe with psychotic features (HCC) Diagnosis: Principal Problem:   Major depressive disorder, single episode, severe with psychotic features (HCC)  Total Time spent with patient: 15 minutes  Past Psychiatric History: Depression  Past Medical History:  Past Medical History:  Diagnosis Date   Anxiety    Bipolar 1 disorder, depressed (HCC)    Depression    Hypertension    History reviewed. No pertinent surgical history. Family History: History reviewed. No pertinent family history. Family Psychiatric  History: Unremarkable Social History:  Social History   Substance and Sexual Activity  Alcohol Use None     Social History   Substance and Sexual Activity  Drug Use Not on file    Social History   Socioeconomic History   Marital status: Single    Spouse name: Not on file   Number of children: Not on file   Years of education: Not on file   Highest education level: Not on file  Occupational History   Not on file  Tobacco Use   Smoking status: Former    Types: Cigarettes   Smokeless tobacco: Never  Substance and Sexual Activity   Alcohol use: Not on file   Drug use: Not on file   Sexual activity: Not on file  Other Topics Concern   Not on file  Social History Narrative   Not on file   Social Determinants of Health   Financial Resource Strain: Not on file  Food Insecurity: Food Insecurity Present (07/06/2022)   Hunger Vital Sign    Worried About Running Out of Food in the Last Year: Sometimes true    Ran Out of Food in the Last Year: Sometimes true  Transportation Needs: Unmet Transportation Needs (07/06/2022)    PRAPARE - Administrator, Civil Service (Medical): Yes    Lack of Transportation (Non-Medical): Yes  Physical Activity: Not on file  Stress: Not on file  Social Connections: Not on file   Additional Social History:                         Sleep: Good  Appetite:  Good  Current Medications: Current Facility-Administered Medications  Medication Dose Route Frequency Provider Last Rate Last Admin   acetaminophen (TYLENOL) tablet 650 mg  650 mg Oral Q6H PRN Lauree Chandler, NP   650 mg at 07/08/22 0811   alum & mag hydroxide-simeth (MAALOX/MYLANTA) 200-200-20 MG/5ML suspension 30 mL  30 mL Oral Q4H PRN Lauree Chandler, NP       chlorproMAZINE (THORAZINE) tablet 50 mg  50 mg Oral QHS Sarina Ill, DO   50 mg at 07/09/22 2134   diphenhydrAMINE (BENADRYL) capsule 50 mg  50 mg Oral TID PRN Lauree Chandler, NP   50 mg at 07/08/22 1226   Or   diphenhydrAMINE (BENADRYL) injection 50 mg  50 mg Intramuscular TID PRN Lauree Chandler, NP       divalproex (DEPAKOTE) DR tablet 500 mg  500 mg Oral BID Sarina Ill, DO   500 mg at 07/10/22 0752   enalapril (VASOTEC) tablet 20 mg  20 mg Oral Daily Lauree Chandler, NP   20 mg at 07/09/22 0919   FLUoxetine (PROZAC) capsule 20 mg  20 mg Oral Daily Lauree Chandler, NP   20 mg at 07/10/22 1610   haloperidol (HALDOL) tablet 5 mg  5 mg Oral TID PRN Lauree Chandler, NP   5 mg at 07/08/22 1226   Or   haloperidol lactate (HALDOL) injection 5 mg  5 mg Intramuscular TID PRN Lauree Chandler, NP       hydrOXYzine (ATARAX) tablet 25 mg  25 mg Oral TID PRN Lauree Chandler, NP   25 mg at 07/10/22 1300   ibuprofen (ADVIL) tablet 600 mg  600 mg Oral Q6H PRN Sarina Ill, DO       LORazepam (ATIVAN) tablet 2 mg  2 mg Oral TID PRN Lauree Chandler, NP   2 mg at 07/08/22 1226   Or   LORazepam (ATIVAN) injection 2 mg  2 mg Intramuscular TID PRN Lauree Chandler, NP       magnesium  hydroxide (MILK OF MAGNESIA) suspension 30 mL  30 mL Oral Daily PRN Lauree Chandler, NP       metoprolol succinate (TOPROL-XL) 24 hr tablet 50 mg  50 mg Oral Daily Sarina Ill, DO   50 mg at 07/10/22 0851   risperiDONE (RISPERDAL) tablet 1 mg  1 mg Oral BH-[id number] Sarina Ill, DO   1 mg at 07/10/22 9604   traZODone (DESYREL) tablet 100 mg  100 mg Oral QHS PRN Sarina Ill, DO   100 mg at 07/09/22 2134    Lab Results: No results found for this or any previous visit (from the past 48 hour(s)).  Blood Alcohol level:  Lab Results  Component Value Date   ETH <10 07/05/2022    Metabolic Disorder Labs: No results found for: "HGBA1C", "MPG" No results found for: "PROLACTIN" No results found for: "CHOL", "TRIG", "HDL", "CHOLHDL", "VLDL", "LDLCALC"  Physical Findings: AIMS:  , ,  ,  ,    CIWA:    COWS:     Musculoskeletal: Strength & Muscle Tone: within normal limits Gait & Station: normal Patient leans: N/A  Psychiatric Specialty Exam:  Presentation  General Appearance:  Appropriate for Environment; Other (comment) (in hospital scrubs)  Eye Contact: Good  Speech: Clear and Coherent; Normal Rate  Speech Volume: Normal  Handedness: Right   Mood and Affect  Mood: Depressed; Anxious  Affect: Blunt   Thought Process  Thought Processes: Coherent; Goal Directed; Linear  Descriptions of Associations:Intact  Orientation:Full (Time, Place and Person)  Thought Content:Logical  History of Schizophrenia/Schizoaffective disorder:No  Duration of Psychotic Symptoms:Greater than six months  Hallucinations:No data recorded Ideas of Reference:None  Suicidal Thoughts:No data recorded Homicidal Thoughts:No data recorded  Sensorium  Memory: Immediate Good  Judgment: Fair  Insight: Fair   Art therapist  Concentration: Good  Attention Span: Good  Recall: Good  Fund of  Knowledge: Good  Language: Good   Psychomotor Activity  Psychomotor Activity:No data recorded  Assets  Assets: Communication Skills; Desire for Improvement; Financial Resources/Insurance; Resilience; Housing   Sleep  Sleep:No data recorded    Blood pressure 93/68, pulse 72, temperature 97.6 F (36.4 C), temperature source Oral, resp. rate 18, height 5\' 9"  (1.753 m), weight 79.4 kg, SpO2 96 %. Body mass index is 25.84 kg/m.   Treatment Plan Summary: Daily contact with patient to assess and evaluate symptoms and progress in treatment, Medication management, and  Plan continue current medications.  Sarina Ill, DO 07/10/2022, 2:30 PM

## 2022-07-11 NOTE — Progress Notes (Signed)
The patient was cooperative and visible in the milieu for evening group and a snack.  He requested his medications at 2100 and fell asleep before 2200, voicing no concerns or complaints.    Pt remains a HIGH FALL RISK and a SEIZURE RISK.

## 2022-07-11 NOTE — Group Note (Signed)
Date:  07/11/2022 Time:  9:00 AM  Group Topic/Focus:  Goals Group:   The focus of this group is to help patients establish daily goals to achieve during treatment and discuss how the patient can incorporate goal setting into their daily lives to aide in recovery.  Community Group   Participation Level:  Did Not Attend  Traci Gafford A Evianna Chandran 07/11/2022, 5:37 PM

## 2022-07-11 NOTE — Plan of Care (Signed)
D- Patient alert and oriented. Patient presented in a pleasant mood on assessment reporting that he slept poor last night due to "restless leg", otherwise, he had no complaints to voice to this Clinical research associate. Patient endorsed hopelessness, depression, and anxiety on his self-inventory, stating that "this is everyday it seems like for me". Denies SI, HI, AVH, and pain at this time. Patient's goal for today is "getting well", in which "meds", will help him achieve his goal.  A- Scheduled medications administered to patient, per MD orders. Support and encouragement provided.  Routine safety checks conducted every 15 minutes.  Patient informed to notify staff with problems or concerns.  R- No adverse drug reactions noted. Patient contracts for safety at this time. Patient compliant with medications. Patient receptive, calm, and cooperative. Patient interacts well with others on the unit. Patient remains safe at this time.  Problem: Education: Goal: Knowledge of [place] [facility] Education information/materials will improve Outcome: Progressing Goal: Emotional status will improve Outcome: Progressing Goal: Mental status will improve Outcome: Progressing Goal: Verbalization of understanding the information provided will improve Outcome: Progressing   Problem: Health Behavior/Discharge Planning: Goal: Identification of resources available to assist in meeting health care needs will improve Outcome: Progressing Goal: Compliance with treatment plan for underlying cause of condition will improve Outcome: Progressing   Problem: Education: Goal: Utilization of techniques to improve thought processes will improve Outcome: Progressing Goal: Knowledge of the prescribed therapeutic regimen will improve Outcome: Progressing   Problem: Activity: Goal: Interest or engagement in leisure activities will improve Outcome: Progressing Goal: Imbalance in normal sleep/wake cycle will improve Outcome: Progressing    Problem: Coping: Goal: Coping ability will improve Outcome: Progressing Goal: Will verbalize feelings Outcome: Progressing   Problem: Health Behavior/Discharge Planning: Goal: Ability to make decisions will improve Outcome: Progressing Goal: Compliance with therapeutic regimen will improve Outcome: Progressing   Problem: Safety: Goal: Ability to disclose and discuss suicidal ideas will improve Outcome: Progressing Goal: Ability to identify and utilize support systems that promote safety will improve Outcome: Progressing   Problem: Self-Concept: Goal: Will verbalize positive feelings about self Outcome: Progressing Goal: Level of anxiety will decrease Outcome: Progressing

## 2022-07-11 NOTE — Group Note (Signed)
Date:  07/11/2022 Time:  3:13 PM  Group Topic/Focus:  Activity Group:  The focus of this group was to encourage patients to come outside to the courtyard and get some exercise and fresh air to help assist with their physical and mental wellbeing.    Participation Level:  Did Not Attend   Keyosha Tiedt M Zhyon Antenucci 07/11/2022, 3:13 PM  

## 2022-07-11 NOTE — Group Note (Signed)
[facility] LCSW Group Therapy Note    Group Date: 07/11/2022 Start Time: 1300 End Time: 1350  Type of Therapy and Topic:  Group Therapy:  Overcoming Obstacles  Participation Level:  [facility] PARTICIPATION LEVEL: Did Not Attend   Description of Group:   In this group patients will be encouraged to explore what they see as obstacles to their own wellness and recovery. They will be guided to discuss their thoughts, feelings, and behaviors related to these obstacles. The group will process together ways to cope with barriers, with attention given to specific choices patients can make. Each patient will be challenged to identify changes they are motivated to make in order to overcome their obstacles. This group will be process-oriented, with patients participating in exploration of their own experiences as well as giving and receiving support and challenge from other group members.  Therapeutic Goals: 1. Patient will identify personal and current obstacles as they relate to admission. 2. Patient will identify barriers that currently interfere with their wellness or overcoming obstacles.  3. Patient will identify feelings, thought process and behaviors related to these barriers. 4. Patient will identify two changes they are willing to make to overcome these obstacles:    Summary of Patient Progress X   Therapeutic Modalities:   Cognitive Behavioral Therapy Solution Focused Therapy Motivational Interviewing Relapse Prevention Therapy   Ravis Herne R Zaydyn Havey, LCSW 

## 2022-07-11 NOTE — Group Note (Signed)
Recreation Therapy Group Note   Group Topic:Goal Setting  Group Date: 07/11/2022 Start Time: 1000 End Time: 1115 Facilitators: Makeya Hilgert E, LRT, CTRS Location:  Craft Room  Group Description: Vision Board. Patients were given many different magazines, a glue stick, markers, and a piece of cardstock paper. LRT and pts discussed the importance of having goals in life. LRT and pts discussed the difference between short-term and long-term goals, as well as what a SMART goal is. LRT encouraged pts to create a vision board, with images they picked and then cut out with safety scissors from the magazine, for themselves, that capture their short and long-term goals. LRT encouraged pts to show and explain their vision board to the group. LRT offered to laminate vision board once dry and complete.   Goal Area(s) Addressed:  Patient will gain knowledge of short vs. long term goals.  Patient will identify goals for themselves. Patient will practice setting SMART goals. Patient will verbalize their goals to LRT and peers.  Affect/Mood: N/A   Participation Level: Did not attend    Clinical Observations/Individualized Feedback: Patient did not attend group.  Plan: Continue to engage patient in RT group sessions 2-3x/week.   Huxley Shurley E Anari Evitt, LRT, CTRS 07/11/2022 11:48 AM 

## 2022-07-11 NOTE — Plan of Care (Signed)
  Problem: Education: Goal: Emotional status will improve Outcome: Progressing Goal: Mental status will improve Outcome: Progressing Goal: Verbalization of understanding the information provided will improve Outcome: Progressing   Problem: Activity: Goal: Interest or engagement in leisure activities will improve Outcome: Progressing

## 2022-07-11 NOTE — Progress Notes (Signed)
Patient ID: [name], male   DOB: 04-15-1960, 62 y.o.   MRN: [id number]  [facility] MD Progress Note  07/11/2022 2:44 PM [name]  MRN:  [id number] Subjective: [name] is seen on rounds.  Reports euthymic mood, "feeling pretty good". Denies SI/HI, AVH, paranoia. Concern today is restless legs he believes may be caused by thorazine. Discussed discontinuing thorazine. Pt is in agreement.  Principal Problem: Major depressive disorder, single episode, severe with psychotic features (HCC) Diagnosis: Principal Problem:   Major depressive disorder, single episode, severe with psychotic features (HCC)  Total Time spent with patient: 15 minutes  Past Psychiatric History: Depression  Past Medical History:  Past Medical History:  Diagnosis Date   Anxiety    Bipolar 1 disorder, depressed (HCC)    Depression    Hypertension    History reviewed. No pertinent surgical history. Family History: History reviewed. No pertinent family history. Family Psychiatric  History: Unremarkable Social History:  Social History   Substance and Sexual Activity  Alcohol Use None     Social History   Substance and Sexual Activity  Drug Use Not on file    Social History   Socioeconomic History   Marital status: Single    Spouse name: Not on file   Number of children: Not on file   Years of education: Not on file   Highest education level: Not on file  Occupational History   Not on file  Tobacco Use   Smoking status: Former    Types: Cigarettes   Smokeless tobacco: Never  Substance and Sexual Activity   Alcohol use: Not on file   Drug use: Not on file   Sexual activity: Not on file  Other Topics Concern   Not on file  Social History Narrative   Not on file   Social Determinants of Health   Financial Resource Strain: Not on file  Food Insecurity: Food Insecurity Present (07/06/2022)   Hunger Vital Sign    Worried About Running Out of Food in the Last Year: Sometimes true    Ran Out of Food in  the Last Year: Sometimes true  Transportation Needs: Unmet Transportation Needs (07/06/2022)   PRAPARE - Administrator, Civil Service (Medical): Yes    Lack of Transportation (Non-Medical): Yes  Physical Activity: Not on file  Stress: Not on file  Social Connections: Not on file   Additional Social History:                         Sleep: Good  Appetite:  Good  Current Medications: Current Facility-Administered Medications  Medication Dose Route Frequency Provider Last Rate Last Admin   acetaminophen (TYLENOL) tablet 650 mg  650 mg Oral Q6H PRN Lauree Chandler, NP   650 mg at 07/08/22 0811   alum & mag hydroxide-simeth (MAALOX/MYLANTA) 200-200-20 MG/5ML suspension 30 mL  30 mL Oral Q4H PRN Lauree Chandler, NP       chlorproMAZINE (THORAZINE) tablet 50 mg  50 mg Oral QHS Sarina Ill, DO   50 mg at 07/10/22 2112   diphenhydrAMINE (BENADRYL) capsule 50 mg  50 mg Oral TID PRN Lauree Chandler, NP   50 mg at 07/11/22 1102   Or   diphenhydrAMINE (BENADRYL) injection 50 mg  50 mg Intramuscular TID PRN Lauree Chandler, NP       divalproex (DEPAKOTE) DR tablet 500 mg  500 mg Oral BID Sarina Ill, DO   500  mg at 07/11/22 0818   enalapril (VASOTEC) tablet 20 mg  20 mg Oral Daily Lauree Chandler, NP   20 mg at 07/11/22 0819   FLUoxetine (PROZAC) capsule 20 mg  20 mg Oral Daily Lauree Chandler, NP   20 mg at 07/11/22 0818   haloperidol (HALDOL) tablet 5 mg  5 mg Oral TID PRN Lauree Chandler, NP   5 mg at 07/11/22 1102   Or   haloperidol lactate (HALDOL) injection 5 mg  5 mg Intramuscular TID PRN Lauree Chandler, NP       hydrOXYzine (ATARAX) tablet 25 mg  25 mg Oral TID PRN Lauree Chandler, NP   25 mg at 07/11/22 0818   ibuprofen (ADVIL) tablet 600 mg  600 mg Oral Q6H PRN Sarina Ill, DO       LORazepam (ATIVAN) tablet 2 mg  2 mg Oral TID PRN Lauree Chandler, NP   2 mg at 07/11/22 1102   Or    LORazepam (ATIVAN) injection 2 mg  2 mg Intramuscular TID PRN Lauree Chandler, NP       magnesium hydroxide (MILK OF MAGNESIA) suspension 30 mL  30 mL Oral Daily PRN Lauree Chandler, NP       metoprolol succinate (TOPROL-XL) 24 hr tablet 50 mg  50 mg Oral Daily Sarina Ill, DO   50 mg at 07/11/22 0818   risperiDONE (RISPERDAL) tablet 1 mg  1 mg Oral [id number] Sarina Ill, DO   1 mg at 07/11/22 0818   traZODone (DESYREL) tablet 100 mg  100 mg Oral QHS PRN Sarina Ill, DO   100 mg at 07/10/22 2111    Lab Results: No results found for this or any previous visit (from the past 48 hour(s)).  Blood Alcohol level:  Lab Results  Component Value Date   ETH <10 07/05/2022    Metabolic Disorder Labs: No results found for: "HGBA1C", "MPG" No results found for: "PROLACTIN" No results found for: "CHOL", "TRIG", "HDL", "CHOLHDL", "VLDL", "LDLCALC"  Physical Findings: AIMS:  , ,  ,  ,    CIWA:    COWS:     Musculoskeletal: Strength & Muscle Tone: within normal limits Gait & Station: normal Patient leans: N/A  Psychiatric Specialty Exam:  Presentation  General Appearance:  Appropriate for Environment; Other (comment) (in hospital scrubs)  Eye Contact: Good  Speech: Clear and Coherent; Normal Rate  Speech Volume: Normal  Handedness: Right  Mood and Affect  Mood: Euthymic  Affect: Congruent, Full range  Thought Process  Thought Processes: Coherent; Goal Directed; Linear  Descriptions of Associations:Intact  Orientation:Full (Time, Place and Person)  Thought Content:Logical  History of Schizophrenia/Schizoaffective disorder:No  Duration of Psychotic Symptoms: NA  Hallucinations:Denies Ideas of Reference:None  Suicidal Thoughts: Denies Homicidal Thoughts:Denies  Sensorium  Memory: Immediate Good  Judgment: Fair  Insight: Fair   Art therapist  Concentration: Good  Attention  Span: Good  Recall: Good  Fund of Knowledge: Good  Language: Good  Psychomotor Activity  Psychomotor Activity:No data recorded  Assets  Assets: Communication Skills; Desire for Improvement; Financial Resources/Insurance; Resilience; Housing   Sleep  Sleep:No data recorded  Blood pressure 107/66, pulse 75, temperature 97.9 F (36.6 C), temperature source Oral, resp. rate 18, height 5\' 9"  (1.753 m), weight 79.4 kg, SpO2 95 %. Body mass index is 25.84 kg/m.  Treatment Plan Summary: Daily contact with patient to assess and evaluate symptoms and progress in treatment, Medication management, and  Plan discontinue thorazine.  Lauree Chandler, NP 07/11/2022, 2:44 PM

## 2022-07-11 NOTE — Group Note (Signed)
Date:  07/11/2022 Time:  7:00 AM  Group Topic/Focus:  Wrap-Up Group:   The focus of this group is to help patients review their daily goal of treatment and discuss progress on daily workbooks.    Participation Level:  Active  Participation Quality:  Appropriate  Affect:  Appropriate  Cognitive:  Alert  Insight: Appropriate  Engagement in Group:  Engaged  Modes of Intervention:  Discussion  Additional Comments:     Maglione,Oralee Rapaport E 07/11/2022, 7:00 AM

## 2022-07-11 NOTE — Group Note (Signed)
Date:  07/11/2022 Time:  9:12 PM  Group Topic/Focus:  Wrap-Up Group:   The focus of this group is to help patients review their daily goal of treatment and discuss progress on daily workbooks.    Participation Level:  Active  Participation Quality:  Appropriate and Attentive  Affect:  Appropriate  Cognitive:  Appropriate  Insight: Appropriate  Engagement in Group:  Engaged  Modes of Intervention:  Discussion  Additional Comments:     [name] 07/11/2022, 9:12 PM

## 2022-07-11 NOTE — Progress Notes (Signed)
Patient pleasant and cooperative. Denies SI, HI, AVH. Endorses depression, and anxiety. Patient refused Clozaril, states he was told he would be taken off of that medication. Patient requested prn for sleep, given with good relief. No complaints or concerns voiced. Patient goal for today is to get a good nights sleep.  Encouragement and support provided. Safety checks maintained. Medications given as prescribed. Pt receptive and remains safe on unit with q 15 min checks.

## 2022-07-12 NOTE — Plan of Care (Signed)
D- Patient alert and oriented. Patient presented in a pleasant mood on assessment reporting that he slept "a little better" last night and stated that whatever medication he received last night helped him out. Patient had no complaints to voice to this Clinical research associate. Patient continues to endorse hopelessness, depression, and anxiety, stating that it's nothing in particular that has him feeling this way. Patient denied SI, HI, AVH, and pain at this time. Patient's goal for today is "getting out", in which "meds", will help him achieve his goal.  A- Scheduled medications administered to patient, per MD orders. Support and encouragement provided.  Routine safety checks conducted every 15 minutes.  Patient informed to notify staff with problems or concerns.  R- No adverse drug reactions noted. Patient contracts for safety at this time. Patient compliant with medications and treatment plan. Patient receptive, calm, and cooperative. Patient interacts well with others on the unit.  Patient remains safe at this time.  Problem: Education: Goal: Knowledge of [facility] Education information/materials will improve Outcome: Progressing Goal: Emotional status will improve Outcome: Progressing Goal: Mental status will improve Outcome: Progressing Goal: Verbalization of understanding the information provided will improve Outcome: Progressing   Problem: Health Behavior/Discharge Planning: Goal: Identification of resources available to assist in meeting health care needs will improve Outcome: Progressing Goal: Compliance with treatment plan for underlying cause of condition will improve Outcome: Progressing   Problem: Education: Goal: Utilization of techniques to improve thought processes will improve Outcome: Progressing Goal: Knowledge of the prescribed therapeutic regimen will improve Outcome: Progressing   Problem: Activity: Goal: Interest or engagement in leisure activities will improve Outcome:  Progressing Goal: Imbalance in normal sleep/wake cycle will improve Outcome: Progressing   Problem: Coping: Goal: Coping ability will improve Outcome: Progressing Goal: Will verbalize feelings Outcome: Progressing   Problem: Health Behavior/Discharge Planning: Goal: Ability to make decisions will improve Outcome: Progressing Goal: Compliance with therapeutic regimen will improve Outcome: Progressing   Problem: Safety: Goal: Ability to disclose and discuss suicidal ideas will improve Outcome: Progressing Goal: Ability to identify and utilize support systems that promote safety will improve Outcome: Progressing   Problem: Self-Concept: Goal: Will verbalize positive feelings about self Outcome: Progressing Goal: Level of anxiety will decrease Outcome: Progressing

## 2022-07-12 NOTE — Progress Notes (Signed)
Patient ID: [name], male   DOB: [birth year], 62 y.o.   MRN: [id number]  [facility] MD Progress Note  07/12/2022 4:01 PM [name]  MRN:  [id number] Subjective: [name] is seen on rounds.  Reports euthymic, "good" mood. He denies SI/HI, AVH, paranoia. He slept well last night, no longer experiencing restless legs. Discussed plans for discharge tomorrow. He is in agreement. Reports he will be returning home. He will follow up with [facility] on Wednesday for continued medication management/counseling.  Principal Problem: Major depressive disorder, single episode, severe with psychotic features (HCC) Diagnosis: Principal Problem:   Major depressive disorder, single episode, severe with psychotic features (HCC)  Total Time spent with patient: 15 minutes  Past Psychiatric History: Depression  Past Medical History:  Past Medical History:  Diagnosis Date   Anxiety    Bipolar 1 disorder, depressed (HCC)    Depression    Hypertension    History reviewed. No pertinent surgical history. Family History: History reviewed. No pertinent family history. Family Psychiatric  History: Unremarkable Social History:  Social History   Substance and Sexual Activity  Alcohol Use None     Social History   Substance and Sexual Activity  Drug Use Not on file    Social History   Socioeconomic History   Marital status: Single    Spouse name: Not on file   Number of children: Not on file   Years of education: Not on file   Highest education level: Not on file  Occupational History   Not on file  Tobacco Use   Smoking status: Former    Types: Cigarettes   Smokeless tobacco: Never  Substance and Sexual Activity   Alcohol use: Not on file   Drug use: Not on file   Sexual activity: Not on file  Other Topics Concern   Not on file  Social History Narrative   Not on file   Social Determinants of Health   Financial Resource Strain: Not on file  Food Insecurity: Food Insecurity Present (07/06/2022)    Hunger Vital Sign    Worried About Running Out of Food in the Last Year: Sometimes true    Ran Out of Food in the Last Year: Sometimes true  Transportation Needs: Unmet Transportation Needs (07/06/2022)   PRAPARE - Administrator, Civil Service (Medical): Yes    Lack of Transportation (Non-Medical): Yes  Physical Activity: Not on file  Stress: Not on file  Social Connections: Not on file   Additional Social History:                         Sleep: Good  Appetite:  Good  Current Medications: Current Facility-Administered Medications  Medication Dose Route Frequency Provider Last Rate Last Admin   acetaminophen (TYLENOL) tablet 650 mg  650 mg Oral Q6H PRN Lauree Chandler, NP   650 mg at 07/08/22 0811   alum & mag hydroxide-simeth (MAALOX/MYLANTA) 200-200-20 MG/5ML suspension 30 mL  30 mL Oral Q4H PRN Lauree Chandler, NP       diphenhydrAMINE (BENADRYL) capsule 50 mg  50 mg Oral TID PRN Lauree Chandler, NP   50 mg at 07/11/22 1102   Or   diphenhydrAMINE (BENADRYL) injection 50 mg  50 mg Intramuscular TID PRN Lauree Chandler, NP       divalproex (DEPAKOTE) DR tablet 500 mg  500 mg Oral BID Sarina Ill, DO   500 mg at 07/12/22 [phone]  enalapril (VASOTEC) tablet 20 mg  20 mg Oral Daily Lauree Chandler, NP   20 mg at 07/12/22 6295   FLUoxetine (PROZAC) capsule 20 mg  20 mg Oral Daily Lauree Chandler, NP   20 mg at 07/12/22 2841   haloperidol (HALDOL) tablet 5 mg  5 mg Oral TID PRN Lauree Chandler, NP   5 mg at 07/11/22 1102   Or   haloperidol lactate (HALDOL) injection 5 mg  5 mg Intramuscular TID PRN Lauree Chandler, NP       hydrOXYzine (ATARAX) tablet 25 mg  25 mg Oral TID PRN Lauree Chandler, NP   25 mg at 07/12/22 3244   ibuprofen (ADVIL) tablet 600 mg  600 mg Oral Q6H PRN Sarina Ill, DO       LORazepam (ATIVAN) tablet 2 mg  2 mg Oral TID PRN Lauree Chandler, NP   2 mg at 07/11/22 2058   Or    LORazepam (ATIVAN) injection 2 mg  2 mg Intramuscular TID PRN Lauree Chandler, NP       magnesium hydroxide (MILK OF MAGNESIA) suspension 30 mL  30 mL Oral Daily PRN Lauree Chandler, NP       metoprolol succinate (TOPROL-XL) 24 hr tablet 50 mg  50 mg Oral Daily Sarina Ill, DO   50 mg at 07/12/22 0102   risperiDONE (RISPERDAL) tablet 1 mg  1 mg Oral BH-q8a4p Sarina Ill, DO   1 mg at 07/12/22 7253   traZODone (DESYREL) tablet 100 mg  100 mg Oral QHS PRN Sarina Ill, DO   100 mg at 07/11/22 2057    Lab Results: No results found for this or any previous visit (from the past 48 hour(s)).  Blood Alcohol level:  Lab Results  Component Value Date   ETH <10 07/05/2022    Metabolic Disorder Labs: No results found for: "HGBA1C", "MPG" No results found for: "PROLACTIN" No results found for: "CHOL", "TRIG", "HDL", "CHOLHDL", "VLDL", "LDLCALC"  Physical Findings: AIMS:  , ,  ,  ,    CIWA:    COWS:     Musculoskeletal: Strength & Muscle Tone: within normal limits Gait & Station: normal Patient leans: N/A  Psychiatric Specialty Exam:  Presentation  General Appearance:  Appropriate for Environment; Other (comment) (in hospital scrubs)  Eye Contact: Good  Speech: Clear and Coherent; Normal Rate  Speech Volume: Normal  Handedness: Right  Mood and Affect  Mood: Euthymic  Affect: Congruent, Full range  Thought Process  Thought Processes: Coherent; Goal Directed; Linear  Descriptions of Associations:Intact  Orientation:Full (Time, Place and Person)  Thought Content:Logical  History of Schizophrenia/Schizoaffective disorder:No  Duration of Psychotic Symptoms: NA  Hallucinations:Denies Ideas of Reference:None  Suicidal Thoughts: Denies Homicidal Thoughts:Denies  Sensorium  Memory: Immediate Good  Judgment: Fair  Insight: Fair   Art therapist  Concentration: Good  Attention  Span: Good  Recall: Good  Fund of Knowledge: Good  Language: Good  Psychomotor Activity  Psychomotor Activity:No data recorded  Assets  Assets: Communication Skills; Desire for Improvement; Financial Resources/Insurance; Resilience; Housing   Sleep  Sleep:No data recorded  Blood pressure 107/75, pulse 77, temperature 97.9 F (36.6 C), temperature source Oral, resp. rate 18, height 5\' 9"  (1.753 m), weight 79.4 kg, SpO2 97 %. Body mass index is 25.84 kg/m.  Treatment Plan Summary: Daily contact with patient to assess and evaluate symptoms and progress in treatment, Medication management, and Plan continue current medications, planning for  discharge tomorrow  Lauree Chandler, NP 07/12/2022, 4:01 PM

## 2022-07-12 NOTE — Group Note (Signed)
Recreation Therapy Group Note   Group Topic:Coping Skills  Group Date: 07/12/2022 Start Time: 1000 End Time: 1050 Facilitators: Sahas Sluka E, LRT, CTRS Location: Courtyard  Group Description: Mind Map.  Patient was provided a blank template of a diagram with 32 blank boxes in a tiered system, branching from the center (similar to a bubble chart). LRT directed patients to label the middle of the diagram "Coping Skills". LRT and patients then came up with 8 different coping skills as examples. Pt were directed to record their coping skills in the 2nd tier boxes closest to the center.  Patients would then share their coping skills with the group as LRT wrote them out. LRT gave a handout of 100 different coping skills at the end of group.   Goal Area(s) Addressed: Patients will be able to define "coping skills". Patient will identify new coping skills.  Patient will identify new possible leisure interests.    Affect/Mood: N/A   Participation Level: Did not attend    Clinical Observations/Individualized Feedback: Patient did not attend group.  Plan: Continue to engage patient in RT group sessions 2-3x/week.   Braylin Formby E Davi Kroon, LRT, CTRS 07/12/2022 11:12 AM 

## 2022-07-12 NOTE — Group Note (Signed)
Date:  07/12/2022 Time:  2:00 PM  Group Topic/Focus:  Healthy Communication:   The focus of this group is to discuss communication, barriers to communication, as well as healthy ways to communicate with others.  Community Group   Participation Level:  Did Not Attend  [name] [name] 07/12/2022, 5:17 PM  

## 2022-07-12 NOTE — Group Note (Signed)
Date:  07/12/2022 Time:  8:44 PM  Group Topic/Focus:  Wrap-Up Group:   The focus of this group is to help patients review their daily goal of treatment and discuss progress on daily workbooks.    Participation Level:  Active  Participation Quality:  Appropriate and Attentive  Affect:  Appropriate  Cognitive:  Appropriate  Insight: Appropriate and Good  Engagement in Group:  Improving  Modes of Intervention:  Support  Additional Comments:     Maglione,Sanad Fearnow E 07/12/2022, 8:44 PM

## 2022-07-12 NOTE — Group Note (Signed)
Date:  07/12/2022 Time:  11:03 AM  Group Topic/Focus:  Activity Group:  The purpose of this activity group is to encourage the patients to come outside to the courtyard and get some fresh air and some exercise to contribute to their physical and mental wellbeing.    Participation Level:  Did Not Attend   Shaan Rhoads M Shamiyah Ngu 07/12/2022, 11:03 AM  

## 2022-07-13 ENCOUNTER — Encounter: Payer: Self-pay | Admitting: Psychiatry

## 2022-07-13 MED ORDER — FLUOXETINE HCL 20 MG PO CAPS: 20.0000 mg | ORAL_CAPSULE | Freq: Every day | ORAL | 3 refills | Status: DC

## 2022-07-13 MED ORDER — RISPERIDONE 1 MG PO TABS: 1.0000 mg | ORAL_TABLET | ORAL | 3 refills | Status: DC

## 2022-07-13 MED ORDER — METOPROLOL SUCCINATE ER 50 MG PO TB24: 50.0000 mg | ORAL_TABLET | Freq: Every day | ORAL | 3 refills | Status: DC

## 2022-07-13 MED ORDER — DIVALPROEX SODIUM 500 MG PO DR TAB: 500.0000 mg | DELAYED_RELEASE_TABLET | Freq: Two times a day (BID) | ORAL | 3 refills | Status: DC

## 2022-07-13 MED ORDER — TRAZODONE HCL 100 MG PO TABS: 100.0000 mg | ORAL_TABLET | Freq: Every evening | ORAL | 3 refills | Status: DC | PRN

## 2022-07-13 MED ORDER — ENALAPRIL MALEATE 20 MG PO TABS: 20.0000 mg | ORAL_TABLET | Freq: Every day | ORAL | 3 refills | Status: DC

## 2022-07-13 NOTE — BH IP Treatment Plan (Signed)
Interdisciplinary Treatment and Diagnostic Plan Update  07/13/2022 Time of Session: 08:30 [name] MRN: [id number]  Principal Diagnosis: Major depressive disorder, single episode, severe with psychotic features (HCC)  Secondary Diagnoses: Principal Problem:   Major depressive disorder, single episode, severe with psychotic features (HCC)   Current Medications:  Current Facility-Administered Medications  Medication Dose Route Frequency Provider Last Rate Last Admin   acetaminophen (TYLENOL) tablet 650 mg  650 mg Oral Q6H PRN Lauree Chandler, NP   650 mg at 07/08/22 0811   alum & mag hydroxide-simeth (MAALOX/MYLANTA) 200-200-20 MG/5ML suspension 30 mL  30 mL Oral Q4H PRN Lauree Chandler, NP       diphenhydrAMINE (BENADRYL) capsule 50 mg  50 mg Oral TID PRN Lauree Chandler, NP   50 mg at 07/11/22 1102   Or   diphenhydrAMINE (BENADRYL) injection 50 mg  50 mg Intramuscular TID PRN Lauree Chandler, NP       divalproex (DEPAKOTE) DR tablet 500 mg  500 mg Oral BID Sarina Ill, DO   500 mg at 07/13/22 0757   enalapril (VASOTEC) tablet 20 mg  20 mg Oral Daily Lauree Chandler, NP   20 mg at 07/13/22 0758   FLUoxetine (PROZAC) capsule 20 mg  20 mg Oral Daily Lauree Chandler, NP   20 mg at 07/13/22 0757   haloperidol (HALDOL) tablet 5 mg  5 mg Oral TID PRN Lauree Chandler, NP   5 mg at 07/11/22 1102   Or   haloperidol lactate (HALDOL) injection 5 mg  5 mg Intramuscular TID PRN Lauree Chandler, NP       hydrOXYzine (ATARAX) tablet 25 mg  25 mg Oral TID PRN Lauree Chandler, NP   25 mg at 07/12/22 2109   ibuprofen (ADVIL) tablet 600 mg  600 mg Oral Q6H PRN Sarina Ill, DO       LORazepam (ATIVAN) tablet 2 mg  2 mg Oral TID PRN Lauree Chandler, NP   2 mg at 07/11/22 2058   Or   LORazepam (ATIVAN) injection 2 mg  2 mg Intramuscular TID PRN Lauree Chandler, NP       magnesium hydroxide (MILK OF MAGNESIA) suspension 30 mL  30 mL Oral  Daily PRN Lauree Chandler, NP       metoprolol succinate (TOPROL-XL) 24 hr tablet 50 mg  50 mg Oral Daily Sarina Ill, DO   50 mg at 07/13/22 0757   risperiDONE (RISPERDAL) tablet 1 mg  1 mg Oral BH-q8a4p Sarina Ill, DO   1 mg at 07/13/22 0756   traZODone (DESYREL) tablet 100 mg  100 mg Oral QHS PRN Sarina Ill, DO   100 mg at 07/12/22 2109   PTA Medications: Medications Prior to Admission  Medication Sig Dispense Refill Last Dose   OLANZapine (ZYPREXA) 10 MG tablet Take 10 mg by mouth at bedtime.   Past Week   traZODone (DESYREL) 50 MG tablet Take 50 mg by mouth at bedtime.   Past Week   [DISCONTINUED] enalapril (VASOTEC) 20 MG tablet Take 20 mg by mouth daily.   Past Week   [DISCONTINUED] FLUoxetine (PROZAC) 20 MG capsule Take 20 mg by mouth daily.   Past Week   traMADol (ULTRAM) 50 MG tablet Take 50 mg by mouth every 6 (six) hours as needed for moderate pain. (Patient not taking: Reported on 07/06/2022)   Not Taking    Patient Stressors: Financial difficulties   Traumatic  event    Patient Strengths: Average or above average intelligence  Communication skills  Motivation for treatment/growth  Physical Health   Treatment Modalities: Medication Management, Group therapy, Case management,  1 to 1 session with clinician, Psychoeducation, Recreational therapy.   Physician Treatment Plan for Primary Diagnosis: Major depressive disorder, single episode, severe with psychotic features (HCC) Long Term Goal(s): Improvement in symptoms so as ready for discharge   Short Term Goals: Ability to verbalize feelings will improve Ability to identify and develop effective coping behaviors will improve  Medication Management: Evaluate patient's response, side effects, and tolerance of medication regimen.  Therapeutic Interventions: 1 to 1 sessions, Unit Group sessions and Medication administration.  Evaluation of Outcomes: Adequate for Discharge  Physician  Treatment Plan for Secondary Diagnosis: Principal Problem:   Major depressive disorder, single episode, severe with psychotic features (HCC)  Long Term Goal(s): Improvement in symptoms so as ready for discharge   Short Term Goals: Ability to verbalize feelings will improve Ability to identify and develop effective coping behaviors will improve     Medication Management: Evaluate patient's response, side effects, and tolerance of medication regimen.  Therapeutic Interventions: 1 to 1 sessions, Unit Group sessions and Medication administration.  Evaluation of Outcomes: Adequate for Discharge   RN Treatment Plan for Primary Diagnosis: Major depressive disorder, single episode, severe with psychotic features (HCC) Long Term Goal(s): Knowledge of disease and therapeutic regimen to maintain health will improve  Short Term Goals: Ability to remain free from injury will improve, Ability to verbalize frustration and anger appropriately will improve, Ability to demonstrate self-control, Ability to participate in decision making will improve, Ability to verbalize feelings will improve, Ability to disclose and discuss suicidal ideas, Ability to identify and develop effective coping behaviors will improve, and Compliance with prescribed medications will improve  Medication Management: RN will administer medications as ordered by provider, will assess and evaluate patient's response and provide education to patient for prescribed medication. RN will report any adverse and/or side effects to prescribing provider.  Therapeutic Interventions: 1 on 1 counseling sessions, Psychoeducation, Medication administration, Evaluate responses to treatment, Monitor vital signs and CBGs as ordered, Perform/monitor CIWA, COWS, AIMS and Fall Risk screenings as ordered, Perform wound care treatments as ordered.  Evaluation of Outcomes: Adequate for Discharge   LCSW Treatment Plan for Primary Diagnosis: Major depressive  disorder, single episode, severe with psychotic features (HCC) Long Term Goal(s): Safe transition to appropriate next level of care at discharge, Engage patient in therapeutic group addressing interpersonal concerns.  Short Term Goals: Engage patient in aftercare planning with referrals and resources, Increase social support, Increase ability to appropriately verbalize feelings, Increase emotional regulation, Facilitate acceptance of mental health diagnosis and concerns, Facilitate patient progression through stages of change regarding substance use diagnoses and concerns, Identify triggers associated with mental health/substance abuse issues, and Increase skills for wellness and recovery  Therapeutic Interventions: Assess for all discharge needs, 1 to 1 time with Social worker, Explore available resources and support systems, Assess for adequacy in community support network, Educate family and significant other(s) on suicide prevention, Complete Psychosocial Assessment, Interpersonal group therapy.  Evaluation of Outcomes: Adequate for Discharge   Progress in Treatment: Attending groups: Yes. and No. Participating in groups: Yes. and No. Taking medication as prescribed: Yes. Toleration medication: Yes. Family/Significant other contact made: No, will contact:  if given permission. Patient understands diagnosis: Yes. Discussing patient identified problems/goals with staff: Yes. Medical problems stabilized or resolved: Yes. Denies suicidal/homicidal ideation: Yes. Issues/concerns per patient  self-inventory: No. Other: none  New problem(s) identified: No, Describe:  None identified Update 07/13/22: No changes at this time.   New Short Term/Long Term Goal(s): elimination of symptoms of psychosis, medication management for mood stabilization; elimination of SI thoughts; development of comprehensive mental wellness plan. Update 07/13/22: No changes at this time.   Patient Goals:  "Hopefully get on  some medication that will help me" Update 07/13/22: No changes at this time.   Discharge Plan or Barriers: CSW will assist with appropriate discharge planning Update 07/13/22: Pt set to discharge today. He plans to return home with outpatient services.   Reason for Continuation of Hospitalization: Depression Hallucinations Medication stabilization Suicidal ideation   Estimated Length of Stay: 1 to 7 days Update 07/13/22: No changes at this time.  Last 3 [place] Suicide Severity Risk Score: Flowsheet Row Admission (Current) from 07/06/2022 in [facility]  C-SSRS RISK CATEGORY No Risk       Last PHQ 2/9 Scores:     No data to display          Scribe for Treatment Team: Glenis Smoker, LCSW 07/13/2022 1:38 PM

## 2022-07-13 NOTE — Group Note (Signed)
Date:  07/13/2022 Time:  10:59 AM  Group Topic/Focus:  Goals Group:   The focus of this group is to help patients establish daily goals to achieve during treatment and discuss how the patient can incorporate goal setting into their daily lives to aide in recovery.    Participation Level:  Did Not Attend   Jovani Colquhoun Travis Kaylee Trivett 07/13/2022, 10:59 AM  

## 2022-07-13 NOTE — Progress Notes (Signed)
D: Patient alert and oriented, able to make needs known. Denies SI/HI, AVH at present. Denies pain at present. Patient goal today "get out." Rates depression 6/10, hopelessness 4/10, and anxiety 6/10. Patient reports energy level as normal. He reports he slept fair last night. Patient does not request any PRN medication at this time.   A: Scheduled medications administered to patient per MD order. Support and encouragement provided. Routine safety checks conducted every fifteen minutes. Patient informed to notify staff with problems or concerns. Frequent verbal contact made.   R: No adverse drug reactions noted. Patient contracts for safety at this time. Patient is compliant with medications and treatment plan. Patient receptive, calm and cooperative. Patient interacts with others appropriately on unit at present. Patient remains safe at present.

## 2022-07-13 NOTE — Group Note (Signed)
Recreation Therapy Group Note   Group Topic:Relaxation  Group Date: 07/13/2022 Start Time: 1000 End Time: 1050 Facilitators: Rosina Lowenstein, LRT, CTRS Location:  Craft room   Group Description: PMR (Progressive Muscle Relaxation). LRT asks patients their current level of stress/anxiety from 1-10, with 10 being the highest. LRT educates patients on what PMR is and the benefits that come from it. Patients are asked to sit with their feet flat on the floor while sitting up and all the way back in their chair, if possible. LRT and pts follow a prompt through a speaker that requires you to tense and release different muscles in their body and focus on their breathing. During session, lights are off and soft music is being played. At the end of the prompt, LRT asks patients to rank their current levels of stress/anxiety from 1-10, 10 being the highest.   Goal Area(s) Addressed:  Patients will be able to describe progressive muscle relaxation.  Patient will practice using relaxation technique. Patient will identify a new coping skill.  Patient will follow multistep directions to reduce anxiety and stress.   Affect/Mood: N/A   Participation Level: Did not attend    Clinical Observations/Individualized Feedback: [name] did not attend group.  Plan: Continue to engage patient in RT group sessions 2-3x/week.   Rosina Lowenstein, LRT, CTRS 07/13/2022 11:16 AM

## 2022-07-13 NOTE — Plan of Care (Signed)
  Problem: Education: Goal: Knowledge of [place] [facility] Education information/materials will improve Outcome: Progressing Goal: Emotional status will improve Outcome: Progressing Goal: Mental status will improve Outcome: Progressing Goal: Verbalization of understanding the information provided will improve Outcome: Progressing   Problem: Health Behavior/Discharge Planning: Goal: Identification of resources available to assist in meeting health care needs will improve Outcome: Progressing Goal: Compliance with treatment plan for underlying cause of condition will improve Outcome: Progressing   Problem: Education: Goal: Utilization of techniques to improve thought processes will improve Outcome: Progressing Goal: Knowledge of the prescribed therapeutic regimen will improve Outcome: Progressing   Problem: Activity: Goal: Interest or engagement in leisure activities will improve Outcome: Progressing Goal: Imbalance in normal sleep/wake cycle will improve Outcome: Progressing   Problem: Coping: Goal: Coping ability will improve Outcome: Progressing Goal: Will verbalize feelings Outcome: Progressing   Problem: Health Behavior/Discharge Planning: Goal: Ability to make decisions will improve Outcome: Progressing Goal: Compliance with therapeutic regimen will improve Outcome: Progressing   Problem: Safety: Goal: Ability to disclose and discuss suicidal ideas will improve Outcome: Progressing Goal: Ability to identify and utilize support systems that promote safety will improve Outcome: Progressing   Problem: Self-Concept: Goal: Will verbalize positive feelings about self Outcome: Progressing Goal: Level of anxiety will decrease Outcome: Progressing

## 2022-07-13 NOTE — Discharge Summary (Signed)
Physician Discharge Summary Note  Patient:  [name] is an 62 y.o., male MRN:  [id number] DOB:  [birth year] Patient phone:  [phone] (home)  Patient address:   [address] [place] [address],  Total Time spent with patient: 1 hour  Date of Admission:  07/06/2022 Date of Discharge: 07/13/2022  Reason for Admission:  Patient chart reviewed. Patient with duplicate chart in system. Patient is a 62 y/o male with history of substance abuse and depression. Was transferred to inpatient psych unit from [facility] where he had initially presented on 07/05/22 with complaints of suicidal ideations for several days with plan to hang himself, as well as auditory hallucinations telling him to harm himself.    Patient assessed at bedside. He is alert and oriented x 4, in no acute distress, non toxic appearing. Reports presenting to the ED because "severe depression". Reports he has been experiencing severe depression "for a long time". States he has no will or reason to live. When he presented to the ED was having thoughts of killing himself by hanging himself. Denies suicidal ideations currently. States he experiences suicidal ideations "all the time, thinking of hanging myself". When asked what has stopped him in the past, reports "I just don't do it, I don't want to die, but I don't want to live".   Patient denies homicidal ideations.     Patient denies current auditory hallucinations. Reports last experiencing auditory hallucinations yesterday. Reports hearing his brothers' voices telling him to kill himself. He denies experiencing visual hallucinations.    Patient reports history of alcohol, cocaine, marijuana abuse. Reports only using marijuana right now. States using marijuana once every 2 weeks, last use was about 2 weeks ago. UDS from 07/05/22 +cannabinoid.   Patient currently living with friend, paying friend rent. Reports friend is not around much and he has no family around. He states "I don't  do anything for fun, don't do anything except take care of the house, self, animals".    Patient denies history of suicide attempts. Reports history of non suicidal self injurious behavior, cutting, last occurring about a year ago. Reports he has "cut bad" in the past although this was not an attempt to take his life. He endorses history of inpatient psychiatric hospitalizations. Last inpatient psychiatric hospitalization was at this unit, 04/14/22-04/18/22, after presenting to the ED with depressed mood and suicidal thoughts. He had been stabilized on vasotec 20mg  daily, prozac 20mg  daily, zyprexa 10mg  at bedtime, trazodone 50mg  daily at bedtime prn.  Patient is agreeable to re-starting these medications.   Notified by staff that patient appears to have had a syncopal episode and hit his head from a seated position. Went to assess patient. Patient alert and oriented x4, in no acute distress, non-toxic appearing. Reports having had similar syncopal episodes for years. He denies headache, dizziness, blurry vision, reports feeling back to baseline. Spoke with staff who witnessed episode, who reports patient hit head although was not hard. Per chart review, patient with history of syncope and seizure like episodes. 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 Consulted with Dr. Elon Spanner, staff to continue to monitor.   Principal Problem: Major depressive disorder, single episode, severe with psychotic features [facility]) Discharge Diagnoses: Principal Problem:   Major depressive disorder, single episode, severe with psychotic features [facility])   Past Psychiatric History: History of depression   Past Medical History:  Past Medical History:  Diagnosis Date   Anxiety    Bipolar 1 disorder, depressed (HCC)  Depression    Hypertension    History reviewed. No pertinent surgical history. Family History: History reviewed. No pertinent family history. Family Psychiatric  History: Unremarkable Social History:   Social History   Substance and Sexual Activity  Alcohol Use None     Social History   Substance and Sexual Activity  Drug Use Not on file    Social History   Socioeconomic History   Marital status: Single    Spouse name: Not on file   Number of children: Not on file   Years of education: Not on file   Highest education level: Not on file  Occupational History   Not on file  Tobacco Use   Smoking status: Former    Types: Cigarettes   Smokeless tobacco: Never  Substance and Sexual Activity   Alcohol use: Not on file   Drug use: Not on file   Sexual activity: Not on file  Other Topics Concern   Not on file  Social History Narrative   Not on file   Social Determinants of Health   Financial Resource Strain: Not on file  Food Insecurity: Food Insecurity Present (07/06/2022)   Hunger Vital Sign    Worried About Running Out of Food in the Last Year: Sometimes true    Ran Out of Food in the Last Year: Sometimes true  Transportation Needs: Unmet Transportation Needs (07/06/2022)   PRAPARE - Administrator, Civil Service (Medical): Yes    Lack of Transportation (Non-Medical): Yes  Physical Activity: Not on file  Stress: Not on file  Social Connections: Not on file    Hospital Course: [name] was voluntarily admitted to inpatient psychiatry for worsening depression and suicidal ideation.  He has been off of his medications for quite some time.  He endorsed anhedonia and difficulty sleeping.  He also has a history of seizures and has been off of his Depakote for numerous years.  He did have a seizure in the first week he was here.  His Depakote was restarted at 500 mg twice a day.  He no longer had any seizures.  For insomnia he was initiated on Thorazine which was changed to trazodone and he did well with this.  Prozac was added along with Risperdal and he did well with his medications and his mood and affect improved.  He also had some blood pressure problems where his  Vasotec was increased and he was started on Toprol-XL with success.  It was felt that he maximized hospitalization and he was discharged home.  On the day of discharge he denied suicidal ideation, homicidal ideation, auditory or visual hallucinations.  Judgment and insight were good.  Physical Findings: AIMS:  , ,  ,  ,    CIWA:    COWS:     Musculoskeletal: Strength & Muscle Tone: within normal limits Gait & Station: normal Patient leans: N/A   Psychiatric Specialty Exam:  Presentation  General Appearance:  Appropriate for Environment; Other (comment) (in hospital scrubs)  Eye Contact: Good  Speech: Clear and Coherent; Normal Rate  Speech Volume: Normal  Handedness: Right   Mood and Affect  Mood: Depressed; Anxious  Affect: Blunt   Thought Process  Thought Processes: Coherent; Goal Directed; Linear  Descriptions of Associations:Intact  Orientation:Full (Time, Place and Person)  Thought Content:Logical  History of Schizophrenia/Schizoaffective disorder:No  Duration of Psychotic Symptoms:Greater than six months  Hallucinations:No data recorded Ideas of Reference:None  Suicidal Thoughts:No data recorded Homicidal Thoughts:No data recorded  Sensorium  Memory: Immediate Good  Judgment: Fair  Insight: Fair   Executive Functions  Concentration: Good  Attention Span: Good  Recall: Dudley Major of Knowledge: Good  Language: Good   Psychomotor Activity  Psychomotor Activity:No data recorded  Assets  Assets: Communication Skills; Desire for Improvement; Financial Resources/Insurance; Resilience; Housing   Sleep  Sleep:No data recorded   Physical Exam: Physical Exam Vitals and nursing note reviewed.  Constitutional:      Appearance: Normal appearance. He is normal weight.  Neurological:     General: No focal deficit present.     Mental Status: He is alert and oriented to person, place, and time.  Psychiatric:         Attention and Perception: Attention and perception normal.        Mood and Affect: Mood and affect normal.        Speech: Speech normal.        Behavior: Behavior normal. Behavior is cooperative.        Thought Content: Thought content normal.        Cognition and Memory: Cognition and memory normal.        Judgment: Judgment normal.    Review of Systems  Constitutional: Negative.   HENT: Negative.    Eyes: Negative.   Respiratory: Negative.    Cardiovascular: Negative.   Gastrointestinal: Negative.   Genitourinary: Negative.   Musculoskeletal: Negative.   Skin: Negative.   Neurological: Negative.   Endo/Heme/Allergies: Negative.   Psychiatric/Behavioral: Negative.     Blood pressure 106/64, pulse 74, temperature 97.9 F (36.6 C), temperature source Oral, resp. rate (!) 22, height 5\' 9"  (1.753 m), weight 79.4 kg, SpO2 98 %. Body mass index is 25.84 kg/m.   Social History   Tobacco Use  Smoking Status Former   Types: Cigarettes  Smokeless Tobacco Never   Tobacco Cessation:  A prescription for an FDA-approved tobacco cessation medication was offered at discharge and the patient refused   Blood Alcohol level:  Lab Results  Component Value Date   ETH <10 07/05/2022    Metabolic Disorder Labs:  No results found for: "HGBA1C", "MPG" No results found for: "PROLACTIN" No results found for: "CHOL", "TRIG", "HDL", "CHOLHDL", "VLDL", "LDLCALC"  See Psychiatric Specialty Exam and Suicide Risk Assessment completed by Attending Physician prior to discharge.  Discharge destination:  Home  Is patient on multiple antipsychotic therapies at discharge:  No   Has Patient had three or more failed trials of antipsychotic monotherapy by history:  No  Recommended Plan for Multiple Antipsychotic Therapies: NA   Allergies as of 07/13/2022   No Known Allergies      Medication List     STOP taking these medications    OLANZapine 10 MG tablet Commonly known as: ZYPREXA    traMADol 50 MG tablet Commonly known as: ULTRAM       TAKE these medications      Indication  divalproex 500 MG DR tablet Commonly known as: DEPAKOTE Take 1 tablet (500 mg total) by mouth 2 (two) times daily.  Indication: Absence Seizure Disorder   enalapril 20 MG tablet Commonly known as: VASOTEC Take 1 tablet (20 mg total) by mouth daily.  Indication: High Blood Pressure Disorder   FLUoxetine 20 MG capsule Commonly known as: PROZAC Take 1 capsule (20 mg total) by mouth daily.  Indication: Depression   metoprolol succinate 50 MG 24 hr tablet Commonly known as: TOPROL-XL Take 1 tablet (50 mg total) by mouth daily. Start taking on:  July 14, 2022  Indication: High Blood Pressure Disorder, Supraventricular Tachycardia   risperiDONE 1 MG tablet Commonly known as: RISPERDAL Take 1 tablet (1 mg total) by mouth 2 (two) times daily at 8 am and 4 pm.  Indication: Major Depressive Disorder   traZODone 100 MG tablet Commonly known as: DESYREL Take 1 tablet (100 mg total) by mouth at bedtime as needed for sleep. What changed:  medication strength how much to take when to take this reasons to take this  Indication: Trouble Sleeping, Major Depressive Disorder        Follow-up Information     [facility] Go in 7 day(s).   Why: Your appointment is scheduled for 07/20/22 at 10:00 AM. Please bring you insurance card to the appointment. Contact information: [address] [place] [address] [phone]                 Follow-up recommendations:  [facility]    Signed: Sarina Ill, DO 07/13/2022, 11:11 AM

## 2022-07-13 NOTE — Progress Notes (Signed)
Patient was sitting on his bed when this writer went to introduce myself.  He smiled & greeted this Clinical research associate.  He denied SI, HI, hallucinations, and delusions. He excepted both medications without difficulty.  No s/sx. of AIMs.  No s/sx of acute distress observed.  He rested in bed - respirations even & unlabored.  Q78min safety check in place per protocol.  Plan of care active.

## 2022-07-13 NOTE — Progress Notes (Signed)
Patient discharged home with AVS, transition record, SRA, etc. Discharge instructions given to patient. Pt verbalized no questions. Denies SI/HI/AVH on discharge. Being transported home by safe transport.

## 2022-07-13 NOTE — Plan of Care (Signed)
  Problem: Education: Goal: Knowledge of [facility] Education information/materials will improve 07/13/2022 0929 by Harless Litten, RN Outcome: Adequate for Discharge 07/13/2022 [phone] by Harless Litten, RN Outcome: Progressing Goal: Emotional status will improve 07/13/2022 0929 by Harless Litten, RN Outcome: Adequate for Discharge 07/13/2022 0843 by Harless Litten, RN Outcome: Progressing Goal: Mental status will improve 07/13/2022 0929 by Harless Litten, RN Outcome: Adequate for Discharge 07/13/2022 0843 by Harless Litten, RN Outcome: Progressing Goal: Verbalization of understanding the information provided will improve 07/13/2022 0929 by Harless Litten, RN Outcome: Adequate for Discharge 07/13/2022 0843 by Harless Litten, RN Outcome: Progressing   Problem: Health Behavior/Discharge Planning: Goal: Identification of resources available to assist in meeting health care needs will improve 07/13/2022 0929 by Harless Litten, RN Outcome: Adequate for Discharge 07/13/2022 0843 by Harless Litten, RN Outcome: Progressing Goal: Compliance with treatment plan for underlying cause of condition will improve 07/13/2022 0929 by Harless Litten, RN Outcome: Adequate for Discharge 07/13/2022 0843 by Harless Litten, RN Outcome: Progressing   Problem: Activity: Goal: Interest or engagement in leisure activities will improve 07/13/2022 0929 by Harless Litten, RN Outcome: Adequate for Discharge 07/13/2022 [phone] by Harless Litten, RN Outcome: Progressing Goal: Imbalance in normal sleep/wake cycle will improve 07/13/2022 0929 by Harless Litten, RN Outcome: Adequate for Discharge 07/13/2022 0843 by Harless Litten, RN Outcome: Progressing   Problem: Education: Goal: Utilization of techniques to improve thought processes will improve 07/13/2022 0929 by Harless Litten, RN Outcome: Adequate for Discharge 07/13/2022 0843 by Harless Litten, RN Outcome: Progressing Goal: Knowledge of the  prescribed therapeutic regimen will improve 07/13/2022 0929 by Harless Litten, RN Outcome: Adequate for Discharge 07/13/2022 0843 by Harless Litten, RN Outcome: Progressing   Problem: Coping: Goal: Coping ability will improve 07/13/2022 0929 by Harless Litten, RN Outcome: Adequate for Discharge 07/13/2022 0843 by Harless Litten, RN Outcome: Progressing Goal: Will verbalize feelings 07/13/2022 0929 by Harless Litten, RN Outcome: Adequate for Discharge 07/13/2022 0843 by Harless Litten, RN Outcome: Progressing   Problem: Health Behavior/Discharge Planning: Goal: Ability to make decisions will improve 07/13/2022 0929 by Harless Litten, RN Outcome: Adequate for Discharge 07/13/2022 0843 by Harless Litten, RN Outcome: Progressing Goal: Compliance with therapeutic regimen will improve 07/13/2022 0929 by Harless Litten, RN Outcome: Adequate for Discharge 07/13/2022 0843 by Harless Litten, RN Outcome: Progressing   Problem: Safety: Goal: Ability to disclose and discuss suicidal ideas will improve 07/13/2022 0929 by Harless Litten, RN Outcome: Adequate for Discharge 07/13/2022 0843 by Harless Litten, RN Outcome: Progressing Goal: Ability to identify and utilize support systems that promote safety will improve 07/13/2022 0929 by Harless Litten, RN Outcome: Adequate for Discharge 07/13/2022 0843 by Harless Litten, RN Outcome: Progressing   Problem: Self-Concept: Goal: Will verbalize positive feelings about self 07/13/2022 0929 by Harless Litten, RN Outcome: Adequate for Discharge 07/13/2022 0843 by Harless Litten, RN Outcome: Progressing Goal: Level of anxiety will decrease 07/13/2022 0929 by Harless Litten, RN Outcome: Adequate for Discharge 07/13/2022 0843 by Harless Litten, RN Outcome: Progressing

## 2022-07-13 NOTE — BHH Suicide Risk Assessment (Signed)
[facility] Discharge Suicide Risk Assessment   Principal Problem: Major depressive disorder, single episode, severe with psychotic features [facility]) Discharge Diagnoses: Principal Problem:   Major depressive disorder, single episode, severe with psychotic features (HCC)   Total Time spent with patient: 1 hour  Musculoskeletal: Strength & Muscle Tone: within normal limits Gait & Station: normal Patient leans: N/A  Psychiatric Specialty Exam  Presentation  General Appearance:  Appropriate for Environment; Other (comment) (in hospital scrubs)  Eye Contact: Good  Speech: Clear and Coherent; Normal Rate  Speech Volume: Normal  Handedness: Right   Mood and Affect  Mood: Depressed; Anxious  Duration of Depression Symptoms: Greater than two weeks  Affect: Blunt   Thought Process  Thought Processes: Coherent; Goal Directed; Linear  Descriptions of Associations:Intact  Orientation:Full (Time, Place and Person)  Thought Content:Logical  History of Schizophrenia/Schizoaffective disorder:No  Duration of Psychotic Symptoms:Greater than six months  Hallucinations:No data recorded Ideas of Reference:None  Suicidal Thoughts:No data recorded Homicidal Thoughts:No data recorded  Sensorium  Memory: Immediate Good  Judgment: Fair  Insight: Fair   Art therapist  Concentration: Good  Attention Span: Good  Recall: Good  Fund of Knowledge: Good  Language: Good   Psychomotor Activity  Psychomotor Activity:No data recorded  Assets  Assets: Communication Skills; Desire for Improvement; Financial Resources/Insurance; Resilience; Housing   Sleep  Sleep:No data recorded  Physical Exam: Physical Exam ROS Blood pressure 106/64, pulse 74, temperature 97.9 F (36.6 C), temperature source Oral, resp. rate (!) 22, height 5\' 9"  (1.753 m), weight 79.4 kg, SpO2 98 %. Body mass index is 25.84 kg/m.  Mental Status Per Nursing Assessment::   On  Admission:  Suicidal ideation indicated by patient  Demographic Factors:  Male and Caucasian  Loss Factors: NA  Historical Factors: NA  Risk Reduction Factors:   NA  Continued Clinical Symptoms:  Depression:   Anhedonia  Cognitive Features That Contribute To Risk:  None    Suicide Risk:  Minimal: No identifiable suicidal ideation.  Patients presenting with no risk factors but with morbid ruminations; may be classified as minimal risk based on the severity of the depressive symptoms   Follow-up Information     [facility] Go in 7 day(s).   Why: Your appointment is scheduled for 07/20/22 at 10:00 AM. Please bring you insurance card to the appointment. Contact information: [address] [place] [address] [phone]                 Plan Of Care/Follow-up recommendations: [facility]   Sarina Ill, DO 07/13/2022, 11:05 AM

## 2022-07-13 NOTE — Progress Notes (Signed)
  [facility] Adult Case Management Discharge Plan :  Will you be returning to the same living situation after discharge:  Yes,  pt will be returning to his home  At discharge, do you have transportation home?: Yes,  CSW will provide a taxi voucher  Do you have the ability to pay for your medications: Yes,  [place] MEDICAID / [facility]  Release of information consent forms completed and in the chart;  Patient's signature needed at discharge.  Patient to Follow up at:  Follow-up Information     [facility] Go in 7 day(s).   Why: Your appointment is scheduled for 07/20/22 at 10:00 AM. Please bring you insurance card to the appointment. Contact information: [address] [place] [address] [phone]                 Next level of care provider has access to [facility] Link:no  Safety Planning and Suicide Prevention discussed: Yes,  pt declined   Have you used any form of tobacco in the last 30 days? (Cigarettes, Smokeless Tobacco, Cigars, and/or Pipes): No  Has patient been referred to the Quitline?: Patient does not use tobacco/nicotine products  Patient has been referred for addiction treatment: No known substance use disorder.  [address] LCSWA 07/13/2022, 10:37 AM

## 2022-09-15 ENCOUNTER — Encounter: Payer: Self-pay | Admitting: Psychiatry

## 2022-09-15 ENCOUNTER — Inpatient Hospital Stay
Admission: AD | Admit: 2022-09-15 | Discharge: 2022-10-07 | DRG: 885 | Disposition: A | Discharge status: Home / Self Care | Payer: MEDICAID | Source: Intra-hospital | Attending: Psychiatry | Admitting: Psychiatry

## 2022-09-15 ENCOUNTER — Other Ambulatory Visit: Payer: Self-pay

## 2022-09-15 ENCOUNTER — Emergency Department
Admission: EM | Admit: 2022-09-15 | Discharge: 2022-09-15 | Disposition: A | Discharge status: Other Acute Inpatient Hospital | Payer: MEDICAID | Attending: Emergency Medicine | Admitting: Emergency Medicine

## 2022-09-15 ENCOUNTER — Encounter: Payer: Self-pay | Admitting: Emergency Medicine

## 2022-09-15 DIAGNOSIS — F32A Depression, unspecified: Secondary | ICD-10-CM | POA: Insufficient documentation

## 2022-09-15 DIAGNOSIS — F0393 Unspecified dementia, unspecified severity, with mood disturbance: Secondary | ICD-10-CM | POA: Diagnosis present

## 2022-09-15 DIAGNOSIS — Z87891 Personal history of nicotine dependence: Secondary | ICD-10-CM | POA: Insufficient documentation

## 2022-09-15 DIAGNOSIS — F3130 Bipolar disorder, current episode depressed, mild or moderate severity, unspecified: Principal | ICD-10-CM | POA: Diagnosis present

## 2022-09-15 DIAGNOSIS — Z79899 Other long term (current) drug therapy: Secondary | ICD-10-CM

## 2022-09-15 DIAGNOSIS — F332 Major depressive disorder, recurrent severe without psychotic features: Secondary | ICD-10-CM | POA: Insufficient documentation

## 2022-09-15 DIAGNOSIS — Z5982 Transportation insecurity: Secondary | ICD-10-CM | POA: Diagnosis not present

## 2022-09-15 DIAGNOSIS — Z96653 Presence of artificial knee joint, bilateral: Secondary | ICD-10-CM | POA: Insufficient documentation

## 2022-09-15 DIAGNOSIS — Z823 Family history of stroke: Secondary | ICD-10-CM

## 2022-09-15 DIAGNOSIS — G2581 Restless legs syndrome: Secondary | ICD-10-CM | POA: Diagnosis present

## 2022-09-15 DIAGNOSIS — R45851 Suicidal ideations: Secondary | ICD-10-CM | POA: Diagnosis present

## 2022-09-15 DIAGNOSIS — K219 Gastro-esophageal reflux disease without esophagitis: Secondary | ICD-10-CM | POA: Diagnosis present

## 2022-09-15 DIAGNOSIS — Z62810 Personal history of physical and sexual abuse in childhood: Secondary | ICD-10-CM

## 2022-09-15 DIAGNOSIS — Z5941 Food insecurity: Secondary | ICD-10-CM | POA: Diagnosis not present

## 2022-09-15 DIAGNOSIS — F4310 Post-traumatic stress disorder, unspecified: Secondary | ICD-10-CM | POA: Insufficient documentation

## 2022-09-15 DIAGNOSIS — I10 Essential (primary) hypertension: Secondary | ICD-10-CM | POA: Diagnosis present

## 2022-09-15 DIAGNOSIS — G4700 Insomnia, unspecified: Secondary | ICD-10-CM | POA: Diagnosis present

## 2022-09-15 DIAGNOSIS — F0394 Unspecified dementia, unspecified severity, with anxiety: Secondary | ICD-10-CM | POA: Diagnosis present

## 2022-09-15 DIAGNOSIS — Z818 Family history of other mental and behavioral disorders: Secondary | ICD-10-CM | POA: Diagnosis not present

## 2022-09-15 DIAGNOSIS — Z23 Encounter for immunization: Secondary | ICD-10-CM | POA: Diagnosis not present

## 2022-09-15 LAB — URINE DRUG SCREEN, QUALITATIVE (ARMC ONLY)
Amphetamines, Ur Screen: NOT DETECTED
Barbiturates, Ur Screen: NOT DETECTED
Benzodiazepine, Ur Scrn: NOT DETECTED
Cannabinoid 50 Ng, Ur ~~LOC~~: NOT DETECTED
Cocaine Metabolite,Ur ~~LOC~~: NOT DETECTED
MDMA (Ecstasy)Ur Screen: NOT DETECTED
Methadone Scn, Ur: NOT DETECTED
Opiate, Ur Screen: NOT DETECTED
Phencyclidine (PCP) Ur S: NOT DETECTED
Tricyclic, Ur Screen: NOT DETECTED

## 2022-09-15 LAB — COMPREHENSIVE METABOLIC PANEL
ALT: 22 U/L (ref 0–44)
AST: 27 U/L (ref 15–41)
Albumin: 4 g/dL (ref 3.5–5.0)
Alkaline Phosphatase: 55 U/L (ref 38–126)
Anion gap: 12 (ref 5–15)
BUN: 10 mg/dL (ref 6–20)
CO2: 20 mmol/L — ABNORMAL LOW (ref 22–32)
Calcium: 9.4 mg/dL (ref 8.9–10.3)
Chloride: 103 mmol/L (ref 98–111)
Creatinine, Ser: 0.81 mg/dL (ref 0.61–1.24)
GFR, Estimated: >60 mL/min (ref >60)
Glucose, Bld: 115 mg/dL — ABNORMAL HIGH (ref 70–99)
Potassium: 3.9 mmol/L (ref 3.5–5.1)
Sodium: 135 mmol/L (ref 135–145)
Total Bilirubin: 0.2 mg/dL — ABNORMAL LOW (ref 0.3–1.2)
Total Protein: 7.6 g/dL (ref 6.5–8.1)

## 2022-09-15 LAB — CBC
HCT: 45.8 % (ref 39.0–52.0)
Hemoglobin: 15.1 g/dL (ref 13.0–17.0)
MCH: 29.7 pg (ref 26.0–34.0)
MCHC: 33 g/dL (ref 30.0–36.0)
MCV: 90.2 fL (ref 80.0–100.0)
Platelets: 267 10*3/uL (ref 150–400)
RBC: 5.08 MIL/uL (ref 4.22–5.81)
RDW: 14.2 % (ref 11.5–15.5)
WBC: 6 10*3/uL (ref 4.0–10.5)
nRBC: 0 % (ref 0.0–0.2)

## 2022-09-15 LAB — ETHANOL: Alcohol, Ethyl (B): <10 mg/dL (ref <10)

## 2022-09-15 LAB — ACETAMINOPHEN LEVEL: Acetaminophen (Tylenol), Serum: <10 ug/mL — ABNORMAL LOW (ref 10–30)

## 2022-09-15 LAB — SALICYLATE LEVEL: Salicylate Lvl: 7.4 mg/dL (ref 7.0–30.0)

## 2022-09-15 MED ORDER — HALOPERIDOL 5 MG PO TABS
5.0000 mg | ORAL_TABLET | Freq: Three times a day (TID) | ORAL | Status: DC | PRN
  Filled 2022-09-15 (×3): qty 1

## 2022-09-15 MED ORDER — HALOPERIDOL LACTATE 5 MG/ML IJ SOLN: 5.0000 mg | Freq: Three times a day (TID) | INTRAMUSCULAR | Status: DC | PRN

## 2022-09-15 MED ORDER — TRAZODONE HCL 50 MG PO TABS: 50.0000 mg | ORAL_TABLET | Freq: Every evening | ORAL | Status: DC | PRN

## 2022-09-15 MED ORDER — ACETAMINOPHEN 325 MG PO TABS
650.0000 mg | ORAL_TABLET | Freq: Four times a day (QID) | ORAL | Status: DC | PRN
  Administered 2022-09-15 – 2022-10-06 (×37): 650 mg via ORAL
  Filled 2022-09-15 (×41): qty 2

## 2022-09-15 MED ORDER — QUETIAPINE FUMARATE 25 MG PO TABS: 50.0000 mg | ORAL_TABLET | Freq: Every day | ORAL | Status: DC

## 2022-09-15 MED ORDER — INFLUENZA VIRUS VACC SPLIT PF (FLUZONE) 0.5 ML IM SUSY
0.5000 mL | PREFILLED_SYRINGE | INTRAMUSCULAR | Status: AC
  Administered 2022-09-16: 0.5 mL via INTRAMUSCULAR
  Filled 2022-09-15 (×2): qty 0.5

## 2022-09-15 MED ORDER — HYDROXYZINE HCL 25 MG PO TABS
25.0000 mg | ORAL_TABLET | Freq: Three times a day (TID) | ORAL | Status: DC | PRN
  Administered 2022-09-15 – 2022-09-30 (×36): 25 mg via ORAL
  Filled 2022-09-15 (×37): qty 1

## 2022-09-15 MED ORDER — ALUM & MAG HYDROXIDE-SIMETH 200-200-20 MG/5ML PO SUSP
30.0000 mL | ORAL | Status: DC | PRN
  Administered 2022-09-28 – 2022-10-04 (×2): 30 mL via ORAL
  Filled 2022-09-15 (×2): qty 30

## 2022-09-15 MED ORDER — MAGNESIUM HYDROXIDE 400 MG/5ML PO SUSP: 30.0000 mL | Freq: Every day | ORAL | Status: DC | PRN

## 2022-09-15 MED ORDER — HYDROXYZINE HCL 25 MG PO TABS
50.0000 mg | ORAL_TABLET | Freq: Three times a day (TID) | ORAL | Status: DC | PRN
  Administered 2022-09-15: 50 mg via ORAL
  Filled 2022-09-15: qty 2

## 2022-09-15 NOTE — ED Provider Notes (Signed)
[facility] Provider Note    Event Date/Time   First MD Initiated Contact with Patient 09/15/22 1316     (approximate)   History   Suicidal   HPI Xen Colon Crenshaw is a 62 y.o. male with history of cluster B personality disorder, depression, anxiety presenting today for suicidal ideation.  Patient reports ongoing suicidal ideation since his last hospitalization.  His depression anxiety getting getting more severe and is having trouble sleeping.  Notes that he would hang himself as a plan.  Also admits to a intermittently hearing voices.  Denies any HI.  No other recent illnesses.     Physical Exam   Triage Vital Signs: ED Triage Vitals  Encounter Vitals Group     BP 09/15/22 1305 (!) 158/111     Systolic BP Percentile --      Diastolic BP Percentile --      Pulse Rate 09/15/22 1305 71     Resp 09/15/22 1305 18     Temp 09/15/22 1305 97.6 F (36.4 C)     Temp Source 09/15/22 1305 Oral     SpO2 09/15/22 1305 100 %     Weight 09/15/22 1308 177 lb (80.3 kg)     Height 09/15/22 1308 5\' 9"  (1.753 m)     Head Circumference --      Peak Flow --      Pain Score 09/15/22 1308 10     Pain Loc --      Pain Education --      Exclude from Growth Chart --     Most recent vital signs: Vitals:   09/15/22 1305  BP: (!) 158/111  Pulse: 71  Resp: 18  Temp: 97.6 F (36.4 C)  SpO2: 100%   I have reviewed the vital signs. General:  Awake, alert, no acute distress. Head:  Normocephalic, Atraumatic. EENT:  PERRL, EOMI, Oral mucosa pink and moist, Neck is supple. Cardiovascular: Regular rate, 2+ distal pulses. Respiratory:  Normal respiratory effort, symmetrical expansion, no distress.   Extremities:  Moving all four extremities through full ROM without pain.   Neuro:  Alert and oriented.  Interacting appropriately.   Skin:  Warm, dry, no rash.   Psych: Suicidal ideation    ED Results / Procedures / Treatments   Labs (all labs ordered are listed,  but only abnormal results are displayed) Labs Reviewed  COMPREHENSIVE METABOLIC PANEL - Abnormal; Notable for the following components:      Result Value   CO2 20 (*)    Glucose, Bld 115 (*)    Total Bilirubin 0.2 (*)    All other components within normal limits  ETHANOL  CBC  URINE DRUG SCREEN, QUALITATIVE ([facility] ONLY)  SALICYLATE LEVEL  ACETAMINOPHEN LEVEL     EKG    RADIOLOGY    PROCEDURES:  Critical Care performed: No  Procedures   MEDICATIONS ORDERED IN ED: Medications - No data to display   IMPRESSION / MDM / ASSESSMENT AND PLAN / ED COURSE  I reviewed the triage vital signs and the nursing notes.                              Differential diagnosis includes, but is not limited to, suicidal ideation with worsening depression and anxiety.  Patient's presentation is most consistent with acute presentation with potential threat to life or bodily function.  The patient has been placed in psychiatric observation due  to the need to provide a safe environment for the patient while obtaining psychiatric consultation and evaluation, as well as ongoing medical and medication management to treat the patient's condition.  The patient has not been placed under full IVC at this time.  Patient is a 62 year old gentleman presenting today for suicidal ideation with a plan.  Prior history of suicidality but no prior attempts.  Psychiatry was consulted after patient was medically cleared.  Patient signed out to oncoming provider while awaiting evaluation by psychiatry and disposition.     FINAL CLINICAL IMPRESSION(S) / ED DIAGNOSES   Final diagnoses:  Suicidal ideation     Rx / DC Orders   ED Discharge Orders     None        Note:  This document was prepared using Dragon voice recognition software and may include unintentional dictation errors.   Janith Lima, MD 09/15/22 [phone]

## 2022-09-15 NOTE — Plan of Care (Signed)
  Problem: Education: Goal: Knowledge of General Education information will improve Description: Including pain rating scale, medication(s)/side effects and non-pharmacologic comfort measures Outcome: Not Progressing   Problem: Health Behavior/Discharge Planning: Goal: Ability to manage health-related needs will improve Outcome: Not Progressing   Problem: Coping: Goal: Level of anxiety will decrease Outcome: Not Progressing   Problem: Pain Managment: Goal: General experience of comfort will improve Outcome: Not Progressing

## 2022-09-15 NOTE — ED Notes (Signed)
VOL  CONSULT  DONE  PENDING  PLACEMENT 
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
## 2022-09-15 NOTE — Progress Notes (Signed)
Patient is a 62 year old male admitted voluntarily to the [facility] floor from [facility] ED with complaints of worsening depression, increased anxiety, and SI thoughts to hang himself at approx. 2215. "I need to be on medications. I said that I want to hang myself but I won't because of my belief in the Bible." Patient presents to assessment ambulatory. He is A+O x 4. Patient currently endorses SI thoughts without a plan. He agrees to Community education officer for safety. Patient's affect is appropriate and speech is logical and coherent. Patient endorses depression and anxiety 10/10. He states that his main stressors are not being able to control the high level of depression and anxiety. He currently denies pain but states that his anxiety can trigger neck and generalized pain. Patient denies the use of a mobility aid at home. Reports the use of reading glasses and is on his person.  Full set of dentures are at home. Reports last BM on September 14, 2022. Patient denies smoking cigs and denies drinking alcohol. His goal while here is "to become stable."   Skin assessment and body search completed with Darreld Mclean, RN Skin: warm/dry. R knee scab, R/L knees old scars, R big toe bruise, multiple tattoos.  No contrabands found.   Emotional support and reassurance provided throughout admission intake. Consents signed. Afterwards, oriented patient to unit, room and call light, reviewed POC with all questions answered and concerns voiced. Patient verbalized understanding. Denies any needs at this time.  Will continue to monitor with ongoing Q 15 minute safety checks.

## 2022-09-15 NOTE — ED Triage Notes (Signed)
Pt endorses SI for months. Plans to hang himself. States he is supposed to be on meds (prozac and risperdal) but has not been able to get them filled in months.

## 2022-09-15 NOTE — ED Notes (Signed)
Belongings include: black pants, gray underwear, gray shirt, black cell phone, bill fold, tan shoes, white socks. Pt keeping glasses.

## 2022-09-15 NOTE — Tx Team (Signed)
Initial Treatment Plan 09/15/2022 10:11 PM [name] [id number]    PATIENT STRESSORS: Medication change or noncompliance     PATIENT STRENGTHS: Ability for insight  Capable of independent living  Communication skills  Motivation for treatment/growth    PATIENT IDENTIFIED PROBLEMS:   "I need medication to become stable."  "I have SI thoughts.                 DISCHARGE CRITERIA:  Ability to meet basic life and health needs Adequate post-discharge living arrangements Improved stabilization in mood, thinking, and/or behavior Safe-care adequate arrangements made  PRELIMINARY DISCHARGE PLAN: Attend aftercare/continuing care group Return to previous living arrangement  PATIENT/FAMILY INVOLVEMENT: This treatment plan has been presented to and reviewed with the patient, [name]. The patient has been given the opportunity to ask questions and make suggestions.   Luane School, RN 09/15/2022, 10:11 PM

## 2022-09-15 NOTE — BH Assessment (Signed)
Comprehensive Clinical Assessment (CCA) Note  09/15/2022 [name] [id number]  Chief Complaint:  Chief Complaint  Patient presents with   Suicidal   Visit Diagnosis: Major Depression   [place]. Blackledge is a 62 year old male who presents to the ER, after he was seen at [facility] Patient states he has the plan of ending his life by hanging his self. His stressors are both his brothers are decease, his girlfriend has dementia and in a care facility. He is currently homeless and have no motivation or any reason to live. During the interview the patient was calm, cooperative and pleasant. He was able to provide appropriate answers to the questions. Throughout the interview, he denied HI and AV/H.   CCA Screening, Triage and Referral (STR)  Patient Reported Information How did you hear about [place]? Self  What Is the Reason for Your Visit/Call Today? Having plans to end her life.  How Long Has This Been Causing You Problems? 1 wk - 1 month  What Do You Feel Would Help You the Most Today? Treatment for Depression or other mood problem   Have You Recently Had Any Thoughts About Hurting Yourself? Yes  Are You Planning to Commit Suicide/Harm Yourself At This time? Yes   Flowsheet Row ED from 09/15/2022 in [facility] Emergency Department at [facility] Admission (Discharged) from 07/06/2022 in [facility] INPATIENT BEHAVIORAL MEDICINE Admission (Discharged) from 04/14/2022 in [facility] INPATIENT BEHAVIORAL MEDICINE  C-SSRS RISK CATEGORY High Risk No Risk No Risk       Have you Recently Had Thoughts About Hurting Someone Karolee Ohs? No  Are You Planning to Harm Someone at This Time? No  Explanation: No data recorded  Have You Used Any Alcohol or Drugs in the Past 24 Hours? No  What Did You Use and How Much? Marijuana   Do You Currently Have a Therapist/Psychiatrist? No  Name of Therapist/Psychiatrist:    Have You Been Recently Discharged From Any Office Practice or Programs? No  Explanation of  Discharge From Practice/Program: No data recorded    CCA Screening Triage Referral Assessment Type of Contact: Face-to-Face  Telemedicine Service Delivery:   Is this Initial or Reassessment?   Date Telepsych consult ordered in [facility]:    Time Telepsych consult ordered in [facility]:    Location of Assessment: [facility]  Provider Location: [facility] ED   Collateral Involvement: No data recorded  Does Patient Have a Court Appointed Legal Guardian? No  Legal Guardian Contact Information: No data recorded Copy of Legal Guardianship Form: No data recorded Legal Guardian Notified of Arrival: No data recorded Legal Guardian Notified of Pending Discharge: No data recorded If Minor and Not Living with Parent(s), Who has Custody? No data recorded Is CPS involved or ever been involved? Never  Is APS involved or ever been involved? Never   Patient Determined To Be At Risk for Harm To Self or Others Based on Review of Patient Reported Information or Presenting Complaint? Yes, for Self-Harm  Method: No data recorded Availability of Means: No data recorded Intent: No data recorded Notification Required: No data recorded Additional Information for Danger to Others Potential: No data recorded Additional Comments for Danger to Others Potential: No data recorded Are There Guns or Other Weapons in Your Home? No  Types of Guns/Weapons: No data recorded Are These Weapons Safely Secured?                            No  Who  Could Verify You Are Able To Have These Secured: No data recorded Do You Have any Outstanding Charges, Pending Court Dates, Parole/Probation? No data recorded Contacted To Inform of Risk of Harm To Self or Others: No data recorded   Does Patient Present under Involuntary Commitment? Yes    [place] of Residence: [place]   Patient Currently Receiving the Following Services: Not Receiving Services   Determination of Need: Emergent (2 hours)   Options For Referral: Inpatient  Hospitalization     CCA Biopsychosocial Patient Reported Schizophrenia/Schizoaffective Diagnosis in Past: No   Strengths: Some insight, polite and seeking help.   Mental Health Symptoms Depression:   Worthlessness; Hopelessness; Change in energy/activity   Duration of Depressive symptoms:  Duration of Depressive Symptoms: Greater than two weeks   Mania:   N/A   Anxiety:    N/A   Psychosis:   None   Duration of Psychotic symptoms:    Trauma:   Avoids reminders of event; Difficulty staying/falling asleep; Emotional numbing; Guilt/shame   Obsessions:   N/A   Compulsions:   N/A   Inattention:   N/A   Hyperactivity/Impulsivity:   N/A   Oppositional/Defiant Behaviors:   N/A   Emotional Irregularity:   N/A   Other Mood/Personality Symptoms:  No data recorded   Mental Status Exam Appearance and self-care  Stature:   Average   Weight:   Average weight   Clothing:   Casual   Grooming:   Normal   Cosmetic use:   None   Posture/gait:   Normal   Motor activity:   -- (Within normal range.)   Sensorium  Attention:   Normal   Concentration:   Normal   Orientation:   X5   Recall/memory:   Normal   Affect and Mood  Affect:   Depressed; Full Range; Appropriate; Anxious   Mood:   Depressed   Relating  Eye contact:   Normal   Facial expression:   Depressed; Responsive   Attitude toward examiner:   Cooperative   Thought and Language  Speech flow:  Clear and Coherent; Normal   Thought content:   Appropriate to Mood and Circumstances   Preoccupation:   None   Hallucinations:   None   Organization:   Intact; Logical; Coherent   [employer] of Knowledge:   Fair   Intelligence:   Average   Abstraction:   Normal   Judgement:   Normal   Programmer, systems; Adequate   Insight:   Poor   Decision Making:   Normal   Social Functioning  Social Maturity:   Responsible   Social  Judgement:   Normal   Stress  Stressors:   Housing; Grief/losses; Relationship; Transitions   Coping Ability:   Normal   Skill Deficits:   None   Supports:   Family    Religion: Religion/Spirituality Are You A Religious Person?: No  Leisure/Recreation: Leisure / Recreation Do You Have Hobbies?: No  Exercise/Diet: Exercise/Diet Do You Exercise?: No Have You Gained or Lost A Significant Amount of Weight in the Past Six Months?: No Do You Follow a Special Diet?: No Do You Have Any Trouble Sleeping?: No  CCA Employment/Education Employment/Work Situation: Employment / Work Situation Employment Situation: Unemployed Patient's Job has Been Impacted by Current Illness: No Has Patient ever Been in Equities trader?: No  Education: Education Is Patient Currently Attending School?: No Did You Product manager?: No Did You Have An Individualized Education Program (IIEP): No Did  You Have Any Difficulty At School?: No Patient's Education Has Been Impacted by Current Illness: No   CCA Family/Childhood History Family and Relationship History: Family history Marital status: Long term relationship Does patient have children?: No  Childhood History:  Childhood History By whom was/is the patient raised?: Mother Did patient suffer any verbal/emotional/physical/sexual abuse as a child?: No Did patient suffer from severe childhood neglect?: No Has patient ever been sexually abused/assaulted/raped as an adolescent or adult?: No Was the patient ever a victim of a crime or a disaster?: No Witnessed domestic violence?: No Has patient been affected by domestic violence as an adult?: No  CCA Substance Use Alcohol/Drug Use: Alcohol / Drug Use Pain Medications: See PTA Prescriptions: See PTA Over the Counter: See PTA History of alcohol / drug use?: No history of alcohol / drug abuse Longest period of sobriety (when/how long): n/a    ASAM's:  Six Dimensions of Multidimensional  Assessment  Dimension 1:  Acute Intoxication and/or Withdrawal Potential:      Dimension 2:  Biomedical Conditions and Complications:      Dimension 3:  Emotional, Behavioral, or Cognitive Conditions and Complications:     Dimension 4:  Readiness to Change:     Dimension 5:  Relapse, Continued use, or Continued Problem Potential:     Dimension 6:  Recovery/Living Environment:     ASAM Severity Score:    ASAM Recommended Level of Treatment:     Substance use Disorder (SUD)    Recommendations for Services/Supports/Treatments:    Discharge Disposition:    DSM5 Diagnoses: Patient Active Problem List   Diagnosis Date Noted   Suicidal ideation 09/15/2022   Major depressive disorder, single episode, severe with psychotic features (HCC) 07/08/2022   MDD (major depressive disorder) 07/06/2022   Malingering 06/28/2021   Cocaine use    Major depressive disorder, recurrent severe without psychotic features (HCC) 06/18/2021   Cluster B personality disorder (HCC) 05/03/2021   Essential hypertension 01/18/2021   Alcohol abuse 01/18/2021    Referrals to Alternative Service(s): Referred to Alternative Service(s):   Place:   Date:   Time:    Referred to Alternative Service(s):   Place:   Date:   Time:    Referred to Alternative Service(s):   Place:   Date:   Time:    Referred to Alternative Service(s):   Place:   Date:   Time:     Lilyan Gilford MS, LCAS, [facility], [facility] Therapeutic Triage Specialist 09/15/2022 5:11 PM

## 2022-09-15 NOTE — ED Notes (Signed)
Report called to Paulino Rily bhu nurse.

## 2022-09-15 NOTE — ED Notes (Signed)
VOL  CONSULT  DONE  MOVED  TO  BHU  PENDING PLACEMENT

## 2022-09-15 NOTE — ED Notes (Signed)
Pt provided evening snack. 

## 2022-09-16 DIAGNOSIS — F4310 Post-traumatic stress disorder, unspecified: Secondary | ICD-10-CM

## 2022-09-16 DIAGNOSIS — F3130 Bipolar disorder, current episode depressed, mild or moderate severity, unspecified: Secondary | ICD-10-CM | POA: Diagnosis not present

## 2022-09-16 MED ORDER — DIVALPROEX SODIUM 250 MG PO DR TAB
500.0000 mg | DELAYED_RELEASE_TABLET | Freq: Two times a day (BID) | ORAL | Status: DC
  Administered 2022-09-16 – 2022-10-04 (×37): 500 mg via ORAL
  Filled 2022-09-16 (×37): qty 2

## 2022-09-16 MED ORDER — MIRTAZAPINE 15 MG PO TABS
7.5000 mg | ORAL_TABLET | Freq: Every day | ORAL | Status: DC
  Administered 2022-09-16 – 2022-09-17 (×2): 7.5 mg via ORAL
  Filled 2022-09-16 (×2): qty 1

## 2022-09-16 MED ORDER — BUSPIRONE HCL 5 MG PO TABS
5.0000 mg | ORAL_TABLET | Freq: Two times a day (BID) | ORAL | Status: DC
  Administered 2022-09-16 – 2022-10-07 (×43): 5 mg via ORAL
  Filled 2022-09-16 (×43): qty 1

## 2022-09-16 MED ORDER — METOPROLOL SUCCINATE ER 25 MG PO TB24
50.0000 mg | ORAL_TABLET | Freq: Every day | ORAL | Status: DC
  Administered 2022-09-16 – 2022-10-01 (×15): 50 mg via ORAL
  Filled 2022-09-16 (×17): qty 2

## 2022-09-16 NOTE — Group Note (Unsigned)
Date:  09/16/2022 Time:  8:47 PM  Group Topic/Focus:  Making Healthy Choices:   The focus of this group is to help patients identify negative/unhealthy choices they were using prior to admission and identify positive/healthier coping strategies to replace them upon discharge.     Participation Level:  {BHH PARTICIPATION BJYNW:29562}  Participation Quality:  {BHH PARTICIPATION QUALITY:22265}  Affect:  {BHH AFFECT:22266}  Cognitive:  {BHH COGNITIVE:22267}  Insight: {BHH Insight2:20797}  Engagement in Group:  {BHH ENGAGEMENT IN ZHYQM:57846}  Modes of Intervention:  {BHH MODES OF INTERVENTION:22269}  Additional Comments:  ***  [name] 09/16/2022, 8:47 PM

## 2022-09-16 NOTE — Group Note (Signed)
Recreation Therapy Group Note   Group Topic:Team Building  Group Date: 09/16/2022 Start Time: 1400 End Time: 1445 Facilitators: Rosina Lowenstein, LRT, CTRS Location: Courtyard  Group Description: Apples to Apples. LRT and patients played the card game "Apples to Apples" outside in the courtyard while getting fresh air and sunlight. Light music was being played in the background. LRT facilitated post-game discussion on the importance of working well with others, communicating effectively, active listening to others and being a part of a team. LRT and pts discussed how this can apply to life post-discharge.  Goal Area(s) Addressed: Patient will communicate with peers and LRT. Patient will build on frustration tolerance skills.  Patient will practice active listening skills.   Affect/Mood: N/A   Participation Level: Did not attend    Clinical Observations/Individualized Feedback: [name] did not attend group.  Plan: Continue to engage patient in RT group sessions 2-3x/week.   Rosina Lowenstein, LRT, CTRS 09/16/2022 3:12 PM

## 2022-09-16 NOTE — Group Note (Signed)
Date:  09/16/2022 Time:  1:50 PM  Group Topic/Focus:  Making Healthy Choices:   The focus of this group is to help patients identify negative/unhealthy choices they were using prior to admission and identify positive/healthier coping strategies to replace them upon discharge.    Participation Level:  None  Participation Quality:    Affect:    Cognitive:    Insight:   Engagement in Group:    Modes of Intervention:    Additional Comments:    Bobie Kistler 09/16/2022, 1:50 PM

## 2022-09-16 NOTE — Progress Notes (Signed)
Patient pleasant and cooperative. Sad affect.  Endorses passive SI, but contracts for safety on the unit. Endorses hopelessness, anxiety and depression.  Denies AVH, Denies pain.   Compliant with scheduled medications.  15 min checks in place for safety.  Patient hesitant to go to groups.  "What's the point?"  Isolates to room with exception of meals.   Flu vaccine given.

## 2022-09-16 NOTE — Plan of Care (Signed)
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
## 2022-09-16 NOTE — BH IP Treatment Plan (Signed)
Interdisciplinary Treatment and Diagnostic Plan Update  09/16/2022 Time of Session: 10:00 AM  [name] MRN: [id number]  Principal Diagnosis: Bipolar I disorder, most recent episode depressed (HCC)  Secondary Diagnoses: Principal Problem:   Bipolar I disorder, most recent episode depressed (HCC) Active Problems:   PTSD (post-traumatic stress disorder)   Current Medications:  Current Facility-Administered Medications  Medication Dose Route Frequency Provider Last Rate Last Admin   acetaminophen (TYLENOL) tablet 650 mg  650 mg Oral Q6H PRN Onuoha, Chinwendu V, NP   650 mg at 09/16/22 0855   alum & mag hydroxide-simeth (MAALOX/MYLANTA) 200-200-20 MG/5ML suspension 30 mL  30 mL Oral Q4H PRN Onuoha, Chinwendu V, NP       busPIRone (BUSPAR) tablet 5 mg  5 mg Oral BID Lewanda Rife, MD       divalproex (DEPAKOTE) DR tablet 500 mg  500 mg Oral BID Lewanda Rife, MD   500 mg at 09/16/22 1100   haloperidol (HALDOL) tablet 5 mg  5 mg Oral TID PRN Onuoha, Chinwendu V, NP       Or   haloperidol lactate (HALDOL) injection 5 mg  5 mg Intramuscular TID PRN Onuoha, Chinwendu V, NP       hydrOXYzine (ATARAX) tablet 25 mg  25 mg Oral TID PRN Onuoha, Chinwendu V, NP   25 mg at 09/16/22 0855   influenza vac split trivalent PF (FLULAVAL) injection 0.5 mL  0.5 mL Intramuscular Tomorrow-1000 Lewanda Rife, MD       magnesium hydroxide (MILK OF MAGNESIA) suspension 30 mL  30 mL Oral Daily PRN Onuoha, Chinwendu V, NP       metoprolol succinate (TOPROL-XL) 24 hr tablet 50 mg  50 mg Oral Daily Lewanda Rife, MD   50 mg at 09/16/22 1101   mirtazapine (REMERON) tablet 7.5 mg  7.5 mg Oral QHS Lewanda Rife, MD       traZODone (DESYREL) tablet 50 mg  50 mg Oral QHS PRN Onuoha, Chinwendu V, NP       PTA Medications: Medications Prior to Admission  Medication Sig Dispense Refill Last Dose   butalbital-acetaminophen-caffeine (FIORICET) 50-325-40 MG tablet Take 1 tablet by mouth every 6 (six)  hours as needed for headache or migraine.      divalproex (DEPAKOTE) 500 MG DR tablet Take 1 tablet (500 mg total) by mouth 2 (two) times daily. 60 tablet 3    doxepin (SINEQUAN) 100 MG capsule Take 100 mg by mouth at bedtime.      enalapril (VASOTEC) 20 MG tablet Take 20 mg by mouth daily. (Patient not taking: Reported on 09/15/2022)      enalapril (VASOTEC) 20 MG tablet Take 1 tablet (20 mg total) by mouth daily. (Patient not taking: Reported on 09/15/2022) 30 tablet 3    FLUoxetine (PROZAC) 20 MG capsule Take 1 capsule (20 mg total) by mouth daily. (Patient not taking: Reported on 09/15/2022) 30 capsule 0    FLUoxetine (PROZAC) 20 MG capsule Take 1 capsule (20 mg total) by mouth daily. (Patient not taking: Reported on 09/15/2022) 30 capsule 3    haloperidol (HALDOL) 5 MG tablet Take 5 mg by mouth every 6 (six) hours as needed for agitation. (Patient not taking: Reported on 09/15/2022)      metoprolol succinate (TOPROL-XL) 50 MG 24 hr tablet Take 1 tablet (50 mg total) by mouth daily. 30 tablet 3    risperiDONE (RISPERDAL) 1 MG tablet Take 1 tablet (1 mg total) by mouth 2 (two) times daily at 8  am and 4 pm. (Patient not taking: Reported on 09/15/2022) 60 tablet 3    traZODone (DESYREL) 100 MG tablet Take 1 tablet (100 mg total) by mouth at bedtime as needed for sleep. 30 tablet 3    traZODone (DESYREL) 50 MG tablet Take 1 tablet (50 mg total) by mouth at bedtime as needed (Anxiety). (Patient not taking: Reported on 09/15/2022) 30 tablet 0    zolpidem (AMBIEN) 10 MG tablet Take 10 mg by mouth at bedtime.       Patient Stressors: Medication change or noncompliance    Patient Strengths: Ability for insight  Capable of independent living  Manufacturing systems engineer  Motivation for treatment/growth   Treatment Modalities: Medication Management, Group therapy, Case management,  1 to 1 session with clinician, Psychoeducation, Recreational therapy.   Physician Treatment Plan for Primary Diagnosis: Bipolar I  disorder, most recent episode depressed (HCC) Long Term Goal(s): Improvement in symptoms so as ready for discharge   Short Term Goals: Ability to identify changes in lifestyle to reduce recurrence of condition will improve Ability to verbalize feelings will improve Ability to disclose and discuss suicidal ideas Ability to demonstrate self-control will improve Ability to identify and develop effective coping behaviors will improve Ability to maintain clinical measurements within normal limits will improve Compliance with prescribed medications will improve Ability to identify triggers associated with substance abuse/mental health issues will improve  Medication Management: Evaluate patient's response, side effects, and tolerance of medication regimen.  Therapeutic Interventions: 1 to 1 sessions, Unit Group sessions and Medication administration.  Evaluation of Outcomes: Not Progressing  Physician Treatment Plan for Secondary Diagnosis: Principal Problem:   Bipolar I disorder, most recent episode depressed (HCC) Active Problems:   PTSD (post-traumatic stress disorder)  Long Term Goal(s): Improvement in symptoms so as ready for discharge   Short Term Goals: Ability to identify changes in lifestyle to reduce recurrence of condition will improve Ability to verbalize feelings will improve Ability to disclose and discuss suicidal ideas Ability to demonstrate self-control will improve Ability to identify and develop effective coping behaviors will improve Ability to maintain clinical measurements within normal limits will improve Compliance with prescribed medications will improve Ability to identify triggers associated with substance abuse/mental health issues will improve     Medication Management: Evaluate patient's response, side effects, and tolerance of medication regimen.  Therapeutic Interventions: 1 to 1 sessions, Unit Group sessions and Medication administration.  Evaluation  of Outcomes: Not Progressing   RN Treatment Plan for Primary Diagnosis: Bipolar I disorder, most recent episode depressed (HCC) Long Term Goal(s): Knowledge of disease and therapeutic regimen to maintain health will improve  Short Term Goals: Ability to remain free from injury will improve, Ability to verbalize frustration and anger appropriately will improve, Ability to demonstrate self-control, Ability to participate in decision making will improve, Ability to verbalize feelings will improve, Ability to disclose and discuss suicidal ideas, Ability to identify and develop effective coping behaviors will improve, and Compliance with prescribed medications will improve  Medication Management: RN will administer medications as ordered by provider, will assess and evaluate patient's response and provide education to patient for prescribed medication. RN will report any adverse and/or side effects to prescribing provider.  Therapeutic Interventions: 1 on 1 counseling sessions, Psychoeducation, Medication administration, Evaluate responses to treatment, Monitor vital signs and CBGs as ordered, Perform/monitor CIWA, COWS, AIMS and Fall Risk screenings as ordered, Perform wound care treatments as ordered.  Evaluation of Outcomes: Not Progressing   LCSW Treatment Plan for Primary  Diagnosis: Bipolar I disorder, most recent episode depressed (HCC) Long Term Goal(s): Safe transition to appropriate next level of care at discharge, Engage patient in therapeutic group addressing interpersonal concerns.  Short Term Goals: Engage patient in aftercare planning with referrals and resources, Increase social support, Increase ability to appropriately verbalize feelings, Increase emotional regulation, Facilitate acceptance of mental health diagnosis and concerns, and Increase skills for wellness and recovery  Therapeutic Interventions: Assess for all discharge needs, 1 to 1 time with Social worker, Explore available  resources and support systems, Assess for adequacy in community support network, Educate family and significant other(s) on suicide prevention, Complete Psychosocial Assessment, Interpersonal group therapy.  Evaluation of Outcomes: Not Progressing   Progress in Treatment: Attending groups: No. Participating in groups: No. Taking medication as prescribed: Yes. Toleration medication: Yes. Family/Significant other contact made: No, will contact:  CSW will contact if given permission  Patient understands diagnosis: Yes. Discussing patient identified problems/goals with staff: Yes. Medical problems stabilized or resolved: Yes. Denies suicidal/homicidal ideation: No. Issues/concerns per patient self-inventory: No. Other: None  New problem(s) identified: No, Describe:  None identified   New Short Term/Long Term Goal(s): elimination of symptoms of psychosis, medication management for mood stabilization; elimination of SI thoughts; development of comprehensive mental wellness/sobriety plan.   Patient Goals:  " I don't have any goals"  Discharge Plan or Barriers: CSW will assist with appropriate discharge planning   Reason for Continuation of Hospitalization: Depression Medication stabilization Suicidal ideation  Estimated Length of Stay: 1 to 7 days   Last 3 [place] Suicide Severity Risk Score: Flowsheet Row Admission (Current) from 09/15/2022 in [facility] BEHAVIORAL MEDICINE Most recent reading at 09/15/2022 10:00 PM ED from 09/15/2022 in [facility] Emergency Department at [facility] Most recent reading at 09/15/2022  5:26 PM Admission (Discharged) from 07/06/2022 in [facility] INPATIENT BEHAVIORAL MEDICINE Most recent reading at 07/06/2022  3:14 PM  C-SSRS RISK CATEGORY Moderate Risk High Risk No Risk       Last PHQ 2/9 Scores:     No data to display          Scribe for Treatment Team: [name], Theresia Majors 09/16/2022 12:43 PM

## 2022-09-16 NOTE — [facility] Suicide Risk Assessment (Signed)
[facility] Admission Suicide Risk Assessment   Nursing information obtained from:  Patient Demographic factors:  Male, Caucasian, Living alone Current Mental Status:  Suicidal ideation indicated by patient Loss Factors:  Financial problems / change in socioeconomic status Historical Factors:  Family history of mental illness or substance abuse Risk Reduction Factors:  Religious beliefs about death   Principal Problem: MDD (major depressive disorder), recurrent severe, without psychosis (HCC) Diagnosis:  Principal Problem:   MDD (major depressive disorder), recurrent severe, without psychosis (HCC)  Subjective Data: [name] is a 62 y.o. male patient admitted with suicidal ideation with a plan to hang self.   Continued Clinical Symptoms:  Alcohol Use Disorder Identification Test Final Score (AUDIT): 0 The "Alcohol Use Disorders Identification Test", Guidelines for Use in Primary Care, Second Edition.  World Science writer [facility]). Score between 0-7:  no or low risk or alcohol related problems. Score between 8-15:  moderate risk of alcohol related problems. Score between 16-19:  high risk of alcohol related problems. Score 20 or above:  warrants further diagnostic evaluation for alcohol dependence and treatment.   CLINICAL FACTORS:   Severe Anxiety and/or Agitation Bipolar Disorder:   Depressive phase Depression:   Anhedonia Hopelessness Insomnia Severe Previous Psychiatric Diagnoses and Treatments   Musculoskeletal: Strength & Muscle Tone: within normal limits Gait & Station: normal Patient leans: N/A  Psychiatric Specialty Exam:  Presentation  General Appearance:  Appropriate for Environment  Eye Contact: Fair  Speech: Clear and Coherent  Speech Volume: Normal  Handedness: Right   Mood and Affect  Mood: Anxious; Depressed  Affect: Congruent; Constricted   Thought Process  Thought Processes: Coherent; Goal Directed  Descriptions of  Associations:Intact  Orientation:Full (Time, Place and Person)  Thought Content:Abstract Reasoning; Logical; Rumination  History of Schizophrenia/Schizoaffective disorder:No  Duration of Psychotic Symptoms:N/A  Hallucinations:Hallucinations: None  Ideas of Reference:None  Suicidal Thoughts:Suicidal Thoughts: Yes, Active SI Active Intent and/or Plan: With Plan  Homicidal Thoughts:Homicidal Thoughts: No   Sensorium  Memory: Immediate Fair; Recent Fair; Remote Fair  Judgment: Impaired  Insight: Shallow   Executive Functions  Concentration: Fair  Attention Span: Fair  Recall: Fair  Fund of Knowledge: Good  Language: Fair   Psychomotor Activity  Psychomotor Activity: Psychomotor Activity: Decreased   Assets  Assets: Communication Skills; Financial Resources/Insurance   Sleep  Sleep: Sleep: Poor    Physical Exam: Physical Exam Constitutional:      Appearance: Normal appearance.  HENT:     Head: Normocephalic and atraumatic.     Nose: Nose normal.  Eyes:     Pupils: Pupils are equal, round, and reactive to light.  Cardiovascular:     Rate and Rhythm: Normal rate.  Pulmonary:     Effort: Pulmonary effort is normal.  Skin:    General: Skin is warm.  Neurological:     General: No focal deficit present.     Mental Status: He is alert and oriented to person, place, and time.    Review of Systems  Constitutional:  Positive for malaise/fatigue.  HENT:  Negative for congestion and hearing loss.   Eyes:  Negative for blurred vision and double vision.  Respiratory:  Negative for cough and shortness of breath.   Cardiovascular:  Negative for chest pain and palpitations.  Gastrointestinal:  Negative for heartburn, nausea and vomiting.  Neurological:  Negative for dizziness and speech change.  Psychiatric/Behavioral:  Positive for depression and suicidal ideas. The patient is nervous/anxious and has insomnia.    Blood pressure Marland Kitchen)  144/102,  pulse 81, temperature (!) 97.3 F (36.3 C), resp. rate 16, height 5\' 9"  (1.753 m), weight 79.8 kg, SpO2 98%. Body mass index is 25.99 kg/m.   COGNITIVE FEATURES THAT CONTRIBUTE TO RISK:  Polarized thinking and Thought constriction (tunnel vision)    SUICIDE RISK:   Severe:  Frequent, intense, and enduring suicidal ideation, specific plan,  but some objective markers of intent (i.e., choice of lethal method), the method is accessible,  severe dysphoria/symptomatology, multiple risk factors present, and few if any protective factors, particularly a lack of social support.  PLAN OF CARE: Per H&P  I certify that inpatient services furnished can reasonably be expected to improve the patient's condition.   Lewanda Rife, MD

## 2022-09-17 DIAGNOSIS — F3130 Bipolar disorder, current episode depressed, mild or moderate severity, unspecified: Secondary | ICD-10-CM | POA: Diagnosis not present

## 2022-09-17 DIAGNOSIS — F4310 Post-traumatic stress disorder, unspecified: Secondary | ICD-10-CM | POA: Diagnosis not present

## 2022-09-17 MED ORDER — TRAZODONE HCL 100 MG PO TABS
100.0000 mg | ORAL_TABLET | Freq: Every evening | ORAL | Status: DC | PRN
  Administered 2022-09-17 – 2022-09-19 (×3): 100 mg via ORAL
  Filled 2022-09-17 (×3): qty 1

## 2022-09-17 NOTE — Plan of Care (Signed)

## 2022-09-17 NOTE — Progress Notes (Signed)
[facility] MD Progress Note  09/17/2022 [name]  MRN:  [id number]  Subjective: Case discussed with staff, chart reviewed, patient seen during rounds.  Staff reports that patient continues to depressed and hopeless.  Not much change in his depression. Patient reports he feels about the same.  He continues to feel hopeless.  Patient was provided with support.  He was encouraged to attend groups and not stay in bed for most of the day.  Patient reports poor sleep, patient was encouraged to ask for as needed meds if he is not able to sleep tonight.  Patient has suicidal thoughts, denies any intention to harm himself on the unit.  Denies homicidal ideations.  Patient denies auditory visual hallucinations.  He denies side effects from medicine.  Principal Problem: Bipolar I disorder, most recent episode depressed (HCC) Diagnosis: Principal Problem:   Bipolar I disorder, most recent episode depressed (HCC) Active Problems:   PTSD (post-traumatic stress disorder)    Past Psychiatric History: Reports h/o Bipolar disorder, anxiety and PTSD. Reports  multiple psychiatric hospitalization. Denies past suicide attempts.   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 echo 5/17: EF 60-65%, no RWMA, LV dias fxn nl, LA mildly dilated, RV sys fxn nl, PASP nl, moderate sized circumferential pericardial effusion was identified, 2.12 cm around the LV free wall, <1 cm around the RV free wall. Features were not c/w tamponade physiology   PTSD (post-traumatic stress disorder)    Witnessed brother's suicide.   Restless leg syndrome    Seizures (HCC)     Syncope     Past Surgical History:  Procedure Laterality Date   CYSTOSCOPY WITH URETEROSCOPY AND STENT PLACEMENT     ESOPHAGOGASTRODUODENOSCOPY N/A 01/11/2016   Procedure: ESOPHAGOGASTRODUODENOSCOPY (EGD);  Surgeon: Charlott Rakes, MD;  Location: [facility] ENDOSCOPY;  Service: Endoscopy;  Laterality: N/A;   ESOPHAGOGASTRODUODENOSCOPY N/A 04/09/2020   Procedure: ESOPHAGOGASTRODUODENOSCOPY (EGD);  Surgeon: [name], MD;  Location: [facility] ENDOSCOPY;  Service: Gastroenterology;  Laterality: N/A;   INCISION AND DRAINAGE ABSCESS Left 01/02/2018   Procedure: INCISION AND DRAINAGE LEFT KNEE;  Surgeon: Deeann Saint, MD;  Location: [facility] ORS;  Service: Orthopedics;  Laterality: Left;   JOINT REPLACEMENT Right    TKR   KNEE ARTHROSCOPY Right 06/25/2014   Procedure: ARTHROSCOPY KNEE;  Surgeon: Deeann Saint, MD;  Location: [facility] ORS;  Service: Orthopedics;  Laterality: Right;  partial arthroscopic medial menisectomy   LAPAROSCOPIC APPENDECTOMY N/A 06/02/2021   Procedure: APPENDECTOMY LAPAROSCOPIC;  Surgeon: Campbell Lerner, MD;  Location: [facility] ORS;  Service: General;  Laterality: N/A;   TOTAL KNEE ARTHROPLASTY Right 04/22/2015   Procedure: TOTAL KNEE ARTHROPLASTY;  Surgeon: Deeann Saint, MD;  Location: [facility] ORS;  Service: Orthopedics;  Laterality: Right;   TOTAL KNEE ARTHROPLASTY Left 10/30/2017   Procedure: TOTAL KNEE ARTHROPLASTY;  Surgeon: Deeann Saint, MD;  Location: [facility] ORS;  Service: Orthopedics;  Laterality: Left;   TOTAL KNEE REVISION Left 01/02/2018   Procedure: poly exchange of tibia and patella left knee;  Surgeon: Deeann Saint, MD;  Location: [facility] ORS;  Service: Orthopedics;  Laterality: Left;   UMBILICAL HERNIA REPAIR  06/02/2021   Procedure: HERNIA REPAIR UMBILICAL ADULT;  Surgeon: Campbell Lerner, MD;  Location: [facility] ORS;  Service: General;;   Family History:  Family History  Problem Relation Age of Onset   CVA Mother        deceased at age 1   Depression Brother        Died by  suicide at age 59    Social History:  Social History   Substance and Sexual Activity  Alcohol Use Not Currently   Comment: rare     Social History   Substance and Sexual Activity  Drug Use Yes   Types: Marijuana, Cocaine    Social History   Socioeconomic History   Marital status: Single    Spouse name: Not on file   Number of children: Not on file   Years of education: Not on file   Highest education level: Not on file  Occupational History   Not on file  Tobacco Use   Smoking status: Former    Current packs/day: 0.00    Average packs/day: 0.8 packs/day for 20.0 years (15.0 ttl pk-yrs)    Types: Cigarettes    Start date: 05/16/1964    Quit date: 05/16/1984    Years since quitting: 38.3   Smokeless tobacco: Never  Vaping Use   Vaping status: Never Used  Substance and Sexual Activity   Alcohol use: Not Currently    Comment: rare   Drug use: Yes    Types: Marijuana, Cocaine   Sexual activity: Not on file  Other Topics Concern   Not on file  Social History Narrative   ** Merged History Encounter **       Social Determinants of Health   Financial Resource Strain: Not on file  Food Insecurity: Food Insecurity Present (09/15/2022)   Hunger Vital Sign    Worried About Running Out of Food in the Last Year: Sometimes true    Ran Out of Food in the Last Year: Sometimes true  Transportation Needs: Unmet Transportation Needs (09/15/2022)   PRAPARE - Administrator, Civil Service (Medical): Yes    Lack of Transportation (Non-Medical): Yes  Physical Activity: Not on file  Stress: Not on file  Social Connections: Not on file   Additional Social History:                         Sleep: Poor  Appetite:  Fair  Current Medications: Current Facility-Administered Medications  Medication Dose Route Frequency Provider Last Rate Last Admin   acetaminophen (TYLENOL) tablet 650 mg  650 mg Oral Q6H PRN Onuoha, Chinwendu V, NP   650 mg at 09/17/22 1433   alum  & mag hydroxide-simeth (MAALOX/MYLANTA) 200-200-20 MG/5ML suspension 30 mL  30 mL Oral Q4H PRN Onuoha, Chinwendu V, NP       busPIRone (BUSPAR) tablet 5 mg  5 mg Oral BID Lewanda Rife, MD   5 mg at 09/17/22 0755   divalproex (DEPAKOTE) DR tablet 500 mg  500 mg Oral BID Lewanda Rife, MD   500 mg at 09/17/22 0756   haloperidol (HALDOL) tablet 5 mg  5 mg Oral TID PRN Onuoha, Chinwendu V, NP       Or   haloperidol lactate (HALDOL) injection 5 mg  5 mg Intramuscular TID PRN Onuoha, Chinwendu V, NP       hydrOXYzine (ATARAX) tablet 25 mg  25 mg  Oral TID PRN Onuoha, Chinwendu V, NP   25 mg at 09/17/22 1433   magnesium hydroxide (MILK OF MAGNESIA) suspension 30 mL  30 mL Oral Daily PRN Onuoha, Chinwendu V, NP       metoprolol succinate (TOPROL-XL) 24 hr tablet 50 mg  50 mg Oral Daily Lewanda Rife, MD   50 mg at 09/17/22 0755   mirtazapine (REMERON) tablet 7.5 mg  7.5 mg Oral QHS Lewanda Rife, MD   7.5 mg at 09/16/22 2107   traZODone (DESYREL) tablet 100 mg  100 mg Oral QHS PRN Lewanda Rife, MD        Lab Results: No results found for this or any previous visit (from the past 48 hour(s)).  Blood Alcohol level:  Lab Results  Component Value Date   ETH <10 09/15/2022   ETH <10 07/05/2022    Metabolic Disorder Labs: Lab Results  Component Value Date   HGBA1C 5.5 04/16/2022   MPG 111 04/16/2022   MPG 105.41 01/18/2021   No results found for: "PROLACTIN" Lab Results  Component Value Date   CHOL 184 04/16/2022   TRIG 133 04/16/2022   HDL 32 (L) 04/16/2022   CHOLHDL 5.8 04/16/2022   VLDL 27 04/16/2022   LDLCALC 125 (H) 04/16/2022   LDLCALC 128 (H) 01/18/2021    Physical Findings: AIMS:  , ,  ,  ,    CIWA:    COWS:     Musculoskeletal: Strength & Muscle Tone: within normal limits Gait & Station: normal Patient leans: N/A   Psychiatric Specialty Exam:   Presentation  General Appearance:  Appropriate for Environment   Eye Contact: Fair    Speech: Clear and Coherent   Speech Volume: Normal   Handedness: Right     Mood and Affect  Mood: " About the same, depressed"   Affect: Congruent; Constricted     Thought Processes: Coherent; Goal Directed   Descriptions of Associations:Intact   Orientation:Full (Time, Place and Person)   Thought Content:Abstract Reasoning; Logical; Rumination   History of Schizophrenia/Schizoaffective disorder:No   Duration of Psychotic Symptoms:N/A   Hallucinations:Hallucinations: None   Ideas of Reference:None   Suicidal Thoughts:Suicidal Thoughts: Yes, Active    Homicidal Thoughts:Homicidal Thoughts: No     Sensorium  Memory: Immediate Fair; Recent Fair; Remote Fair   Judgment: Impaired   Insight: Shallow     Executive Functions  Concentration: Fair   Attention Span: Fair   Recall: Fair   Fund of Knowledge: Good   Language: Fair       Psychomotor Activity: Psychomotor Activity: Decreased     Assets: Communication Skills; Financial Resources/Insurance     Sleep: Sleep: Poor     Physical Exam Constitutional:      Appearance: Normal appearance.  HENT:     Head: Normocephalic and atraumatic.     Nose: Nose normal.  Eyes:     Pupils: Pupils are equal, round, and reactive to light.  Cardiovascular:     Rate and Rhythm: Normal rate.  Pulmonary:     Effort: Pulmonary effort is normal.  Skin:    General: Skin is warm.  Neurological:     General: No focal deficit present.     Mental Status: He is alert and oriented to person, place, and time.      Review of Systems  Constitutional:  Positive for malaise/fatigue.  HENT:  Negative for congestion and hearing loss.   Eyes:  Negative for blurred vision and double vision.  Respiratory:  Negative  for cough and shortness of breath.   Cardiovascular:  Negative for chest pain and palpitations.  Gastrointestinal:  Negative for heartburn, nausea and vomiting.  Neurological:  Negative for  dizziness and speech change.  Psychiatric/Behavioral:  Positive for depression and suicidal ideas. The patient is nervous/anxious and has insomnia.    Blood pressure (!) 158/93, pulse 80, temperature 98.2 F (36.8 C), temperature source Tympanic, resp. rate 18, height 5\' 9"  (1.753 m), weight 79.8 kg, SpO2 98%. Body mass index is 25.99 kg/m.   Treatment Plan Summary: Daily contact with patient to assess and evaluate symptoms and progress in treatment and Medication management  Patient is admitted to locked unit under safety precautions Continue Depakote 500 mg by mouth twice daily for seizures and to target mood symptoms Increase the dose of Remeron to 15 mg at bedtime to help with depression, 09/17/2022 Continue home medicine for hypertension, metoprolol XL 50 mg daily Continue on BuSpar 5 mg by mouth twice daily for anxiety Side effects of medicine discussed with patient, patient agrees with the plan Social worker consult to discuss housing options and help with a safe discharge plan Continue to encourage sobriety Patient was encouraged to attend group and work on coping strategies Will recommend individual therapy upon discharge  Lewanda Rife, MD

## 2022-09-17 NOTE — Group Note (Signed)
Date:  09/17/2022 Time:  8:33 PM  Group Topic/Focus:  Healthy Communication:   The focus of this group is to discuss communication, barriers to communication, as well as healthy ways to communicate with others.    Participation Level:  Active  Participation Quality:  Appropriate  Affect:  Appropriate  Cognitive:  Appropriate  Insight: Appropriate  Engagement in Group:  Engaged  Modes of Intervention:  Discussion  Additional Comments:    Burt Ek 09/17/2022, 8:33 PM

## 2022-09-17 NOTE — Progress Notes (Addendum)
   09/17/22 0727  Psych Admission Type (Psych Patients Only)  Admission Status Voluntary  Psychosocial Assessment  Patient Complaints Anxiety;Depression;Hopelessness  Eye Contact Fair  Facial Expression Flat  Affect Anxious  Speech Logical/coherent  Interaction Isolative  Motor Activity Slow  Appearance/Hygiene Unremarkable  Behavior Characteristics Cooperative  Mood Anxious;Pleasant  Thought Process  Coherency WDL  Content WDL  Delusions None reported or observed  Perception WDL  Hallucination Auditory;Command  Judgment Impaired  Confusion None  Danger to Self  Current suicidal ideation? Passive  Self-Injurious Behavior No self-injurious ideation or behavior indicators observed or expressed   Danger to Others  Danger to Others None reported or observed   Patient is alert and oriented times 4. He endorse anxiety, depression, passive SI, and auditory hallucinations. He does contract for safety.
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
## 2022-09-17 NOTE — [facility] Suicide Risk Assessment (Signed)
[facility] INPATIENT:  Family/Significant Other Suicide Prevention Education  Suicide Prevention Education:  Patient Refusal for Family/Significant Other Suicide Prevention Education: The patient [name] has refused to provide written consent for family/significant other to be provided Family/Significant Other Suicide Prevention Education during admission and/or prior to discharge.  Physician notified.  Marshell Levan 09/17/2022, 3:05 PM

## 2022-09-17 NOTE — Progress Notes (Signed)
D- Patient alert and oriented. Flat affect. C/o H/A. Pain scale 810. Endorses anxiety. Denies SI, HI.  A- Scheduled medications administered to patient, per MD orders. PRN's given for pain and anxiety. Support and encouragement provided.  Routine safety checks conducted every 15 minutes.  Patient informed to notify staff with problems or concerns. R- No adverse drug reactions noted. Patient contracts for safety at this time. Patient compliant with medications and treatment plan. Patient receptive, calm, and cooperative. Patient interacts well with others on the unit.  Patient remains safe at this time.

## 2022-09-17 NOTE — Plan of Care (Signed)
  Problem: Education: Goal: Knowledge of General Education information will improve Description: Including pain rating scale, medication(s)/side effects and non-pharmacologic comfort measures Outcome: Not Progressing   Problem: Health Behavior/Discharge Planning: Goal: Ability to manage health-related needs will improve Outcome: Not Progressing   Problem: Clinical Measurements: Goal: Ability to maintain clinical measurements within normal limits will improve Outcome: Not Progressing Goal: Will remain free from infection Outcome: Not Progressing Goal: Diagnostic test results will improve Outcome: Not Progressing Goal: Respiratory complications will improve Outcome: Not Progressing Goal: Cardiovascular complication will be avoided Outcome: Not Progressing   Problem: Activity: Goal: Risk for activity intolerance will decrease Outcome: Not Progressing   Problem: Nutrition: Goal: Adequate nutrition will be maintained Outcome: Not Progressing   Problem: Coping: Goal: Level of anxiety will decrease Outcome: Not Progressing   Problem: Elimination: Goal: Will not experience complications related to bowel motility Outcome: Not Progressing Goal: Will not experience complications related to urinary retention Outcome: Not Progressing   Problem: Pain Managment: Goal: General experience of comfort will improve Outcome: Not Progressing   Problem: Safety: Goal: Ability to remain free from injury will improve Outcome: Not Progressing   Problem: Skin Integrity: Goal: Risk for impaired skin integrity will decrease Outcome: Not Progressing   Problem: Health Behavior/Discharge Planning: Goal: Ability to manage health-related needs will improve Outcome: Not Progressing   Problem: Clinical Measurements: Goal: Ability to maintain clinical measurements within normal limits will improve Outcome: Not Progressing   Problem: Clinical Measurements: Goal: Will remain free from  infection Outcome: Not Progressing

## 2022-09-18 DIAGNOSIS — F3130 Bipolar disorder, current episode depressed, mild or moderate severity, unspecified: Secondary | ICD-10-CM | POA: Diagnosis not present

## 2022-09-18 DIAGNOSIS — F4310 Post-traumatic stress disorder, unspecified: Secondary | ICD-10-CM | POA: Diagnosis not present

## 2022-09-18 MED ORDER — BACLOFEN 10 MG PO TABS
10.0000 mg | ORAL_TABLET | ORAL | Status: AC
  Administered 2022-09-18: 10 mg via ORAL
  Filled 2022-09-18: qty 1

## 2022-09-18 MED ORDER — MIRTAZAPINE 15 MG PO TABS
15.0000 mg | ORAL_TABLET | Freq: Every day | ORAL | Status: DC
  Administered 2022-09-18 – 2022-10-06 (×19): 15 mg via ORAL
  Filled 2022-09-18 (×19): qty 1

## 2022-09-18 NOTE — Group Note (Signed)
LCSW Group Therapy Note   Group Date: 09/18/2022 Start Time: 1320 End Time: 1400   Type of Therapy and Topic:  Group Therapy: 8 Dimensions of Wellness  Participation Level:  Did Not Attend     Summary of Patient Progress:  The patient did not attend group.    Marshell Levan, LCSWA 09/18/2022  3:21 PM

## 2022-09-18 NOTE — Group Note (Signed)
Date:  09/18/2022 Time:  10:26 PM  Group Topic/Focus:  Making Healthy Choices:   The focus of this group is to help patients identify negative/unhealthy choices they were using prior to admission and identify positive/healthier coping strategies to replace them upon discharge.    Participation Level:  Active  Participation Quality:  Appropriate  Affect:  Appropriate  Cognitive:  Appropriate  Insight: Good  Engagement in Group:  Engaged  Modes of Intervention:  Discussion  Additional Comments:    Maeola Harman 09/18/2022, 10:26 PM

## 2022-09-18 NOTE — Progress Notes (Signed)
Patient is admitted to [facility] for Bipolar/ depression with passive S/I. Has been isolating to his room and will join in group and comes out for meals. Is pleasant and cooperative with peers and staff. Denies H/I, endorses passive S/I and AVH (voices), depression and anxiety.  Patient received Baclofen for chronic pain and muscle spasms of the neck and prn Atarax and tylenol for anxiety and headache.  Will continue to monitor.

## 2022-09-18 NOTE — Plan of Care (Signed)

## 2022-09-18 NOTE — [facility] Group Notes (Signed)
[facility] Group Notes:  (Nursing/MHT/Case Management/Adjunct)  Date:  09/18/2022  Time: 1000  Type of Therapy:  Psychoeducational Skills  Participation Level:  resistant  Participation Quality:  resistant and minimal  Affect:  depressed  Cognitive:  Appropriate  Insight: poor  Engagement in Group: limited  Modes of Intervention:  Discussion, Education, and Exploration  Summary of Progress/Problems:  Patients were given education on recognizing self sabotaging behaviors, and negative behavioral patterns as it pertains to mental health. Pts were encouraged to share one behavior they would like to change in their mental health . Pts were then given a podcast to listen to on healthy coping skills. Pt attended but would not share when called upon.  [name] 09/18/2022, 12:10 PM

## 2022-09-18 NOTE — Progress Notes (Signed)
D- Patient alert and oriented. Sad affect. Anxious mood. C/o H/A. Denies SI, HI, AVH, and pain.  A- Scheduled medications administered to patient, per MD orders. PRN's given for pain and anxiety. Support and encouragement provided.  Routine safety checks conducted every 15 minutes.  Patient informed to notify staff with problems or concerns. R- No adverse drug reactions noted. Patient contracts for safety at this time. Patient compliant with medications and treatment plan. Patient receptive, calm, and cooperative. Patient interacts well with others on the unit.  Patient remains safe at this time.

## 2022-09-18 NOTE — Progress Notes (Signed)
[facility] MD Progress Note  09/18/2022 [name]  MRN:  [id number]  Subjective: Case discussed with staff, chart reviewed, patient seen during rounds.  Staff reports that patient continues to depressed and hopeless.  Patient received Atarax for anxiety and Tylenol as needed for headache.   Patient reports continues to feel depressed and hopeless.  Patient reports he has tension headaches.  Reports his sleep is up and down.  Appetite has improved.  Patient had made an attempt to attend group today, which is an improvement so far.  Patient has suicidal thoughts, denies any intention to harm himself on the unit.  Denies homicidal ideations.  Patient denies auditory visual hallucinations.    Principal Problem: Bipolar I disorder, most recent episode depressed (HCC) Diagnosis: Principal Problem:   Bipolar I disorder, most recent episode depressed (HCC) Active Problems:   PTSD (post-traumatic stress disorder)    Past Psychiatric History: Reports h/o Bipolar disorder, anxiety and PTSD. Reports  multiple psychiatric hospitalization. Denies past suicide attempts.   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 echo 5/17: EF 60-65%, no RWMA, LV dias fxn nl, LA mildly dilated, RV sys fxn nl, PASP nl, moderate sized circumferential pericardial effusion was identified, 2.12 cm around the LV free wall, <1 cm around the RV free wall. Features were not c/w tamponade physiology   PTSD (post-traumatic stress disorder)    Witnessed brother's suicide.   Restless leg syndrome    Seizures (HCC)    Syncope     Past Surgical History:  Procedure Laterality  Date   CYSTOSCOPY WITH URETEROSCOPY AND STENT PLACEMENT     ESOPHAGOGASTRODUODENOSCOPY N/A 01/11/2016   Procedure: ESOPHAGOGASTRODUODENOSCOPY (EGD);  Surgeon: Charlott Rakes, MD;  Location: [facility] ENDOSCOPY;  Service: Endoscopy;  Laterality: N/A;   ESOPHAGOGASTRODUODENOSCOPY N/A 04/09/2020   Procedure: ESOPHAGOGASTRODUODENOSCOPY (EGD);  Surgeon: [name], MD;  Location: [facility] ENDOSCOPY;  Service: Gastroenterology;  Laterality: N/A;   INCISION AND DRAINAGE ABSCESS Left 01/02/2018   Procedure: INCISION AND DRAINAGE LEFT KNEE;  Surgeon: Deeann Saint, MD;  Location: [facility] ORS;  Service: Orthopedics;  Laterality: Left;   JOINT REPLACEMENT Right    TKR   KNEE ARTHROSCOPY Right 06/25/2014   Procedure: ARTHROSCOPY KNEE;  Surgeon: Deeann Saint, MD;  Location: [facility] ORS;  Service: Orthopedics;  Laterality: Right;  partial arthroscopic medial menisectomy   LAPAROSCOPIC APPENDECTOMY N/A 06/02/2021   Procedure: APPENDECTOMY LAPAROSCOPIC;  Surgeon: Campbell Lerner, MD;  Location: [facility] ORS;  Service: General;  Laterality: N/A;   TOTAL KNEE ARTHROPLASTY Right 04/22/2015   Procedure: TOTAL KNEE ARTHROPLASTY;  Surgeon: Deeann Saint, MD;  Location: [facility] ORS;  Service: Orthopedics;  Laterality: Right;   TOTAL KNEE ARTHROPLASTY Left 10/30/2017   Procedure: TOTAL KNEE ARTHROPLASTY;  Surgeon: Deeann Saint, MD;  Location: [facility] ORS;  Service: Orthopedics;  Laterality: Left;   TOTAL KNEE REVISION Left 01/02/2018   Procedure: poly exchange of tibia and patella left knee;  Surgeon: Deeann Saint, MD;  Location: [facility] ORS;  Service: Orthopedics;  Laterality: Left;   UMBILICAL HERNIA REPAIR  06/02/2021  Procedure: HERNIA REPAIR UMBILICAL ADULT;  Surgeon: Campbell Lerner, MD;  Location: [facility] ORS;  Service: General;;   Family History:  Family History  Problem Relation Age of Onset   CVA Mother        deceased at age 61   Depression Brother        Died by suicide at age 27    Social History:  Social History    Substance and Sexual Activity  Alcohol Use Not Currently   Comment: rare     Social History   Substance and Sexual Activity  Drug Use Yes   Types: Marijuana, Cocaine    Social History   Socioeconomic History   Marital status: Single    Spouse name: Not on file   Number of children: Not on file   Years of education: Not on file   Highest education level: Not on file  Occupational History   Not on file  Tobacco Use   Smoking status: Former    Current packs/day: 0.00    Average packs/day: 0.8 packs/day for 20.0 years (15.0 ttl pk-yrs)    Types: Cigarettes    Start date: 05/16/1964    Quit date: 05/16/1984    Years since quitting: 38.3   Smokeless tobacco: Never  Vaping Use   Vaping status: Never Used  Substance and Sexual Activity   Alcohol use: Not Currently    Comment: rare   Drug use: Yes    Types: Marijuana, Cocaine   Sexual activity: Not on file  Other Topics Concern   Not on file  Social History Narrative   ** Merged History Encounter **       Social Determinants of Health   Financial Resource Strain: Not on file  Food Insecurity: Food Insecurity Present (09/15/2022)   Hunger Vital Sign    Worried About Running Out of Food in the Last Year: Sometimes true    Ran Out of Food in the Last Year: Sometimes true  Transportation Needs: Unmet Transportation Needs (09/15/2022)   PRAPARE - Administrator, Civil Service (Medical): Yes    Lack of Transportation (Non-Medical): Yes  Physical Activity: Not on file  Stress: Not on file  Social Connections: Not on file   Additional Social History:                         Sleep: Poor  Appetite:  Fair  Current Medications: Current Facility-Administered Medications  Medication Dose Route Frequency Provider Last Rate Last Admin   acetaminophen (TYLENOL) tablet 650 mg  650 mg Oral Q6H PRN Onuoha, Chinwendu V, NP   650 mg at 09/18/22 0759   alum & mag hydroxide-simeth (MAALOX/MYLANTA) 200-200-20  MG/5ML suspension 30 mL  30 mL Oral Q4H PRN Onuoha, Chinwendu V, NP       busPIRone (BUSPAR) tablet 5 mg  5 mg Oral BID Lewanda Rife, MD   5 mg at 09/18/22 0950   divalproex (DEPAKOTE) DR tablet 500 mg  500 mg Oral BID Lewanda Rife, MD   500 mg at 09/18/22 0951   haloperidol (HALDOL) tablet 5 mg  5 mg Oral TID PRN Onuoha, Chinwendu V, NP       Or   haloperidol lactate (HALDOL) injection 5 mg  5 mg Intramuscular TID PRN Onuoha, Chinwendu V, NP       hydrOXYzine (ATARAX) tablet 25 mg  25 mg Oral TID PRN Onuoha, Chinwendu V, NP   25 mg at 09/18/22 1884  magnesium hydroxide (MILK OF MAGNESIA) suspension 30 mL  30 mL Oral Daily PRN Onuoha, Chinwendu V, NP       metoprolol succinate (TOPROL-XL) 24 hr tablet 50 mg  50 mg Oral Daily Lewanda Rife, MD   50 mg at 09/18/22 0951   mirtazapine (REMERON) tablet 7.5 mg  7.5 mg Oral QHS Lewanda Rife, MD   7.5 mg at 09/17/22 2116   traZODone (DESYREL) tablet 100 mg  100 mg Oral QHS PRN Lewanda Rife, MD   100 mg at 09/17/22 2138    Lab Results: No results found for this or any previous visit (from the past 48 hour(s)).  Blood Alcohol level:  Lab Results  Component Value Date   ETH <10 09/15/2022   ETH <10 07/05/2022    Metabolic Disorder Labs: Lab Results  Component Value Date   HGBA1C 5.5 04/16/2022   MPG 111 04/16/2022   MPG 105.41 01/18/2021   No results found for: "PROLACTIN" Lab Results  Component Value Date   CHOL 184 04/16/2022   TRIG 133 04/16/2022   HDL 32 (L) 04/16/2022   CHOLHDL 5.8 04/16/2022   VLDL 27 04/16/2022   LDLCALC 125 (H) 04/16/2022   LDLCALC 128 (H) 01/18/2021    Physical Findings: AIMS:  , ,  ,  ,    CIWA:    COWS:     Musculoskeletal: Strength & Muscle Tone: within normal limits Gait & Station: normal Patient leans: N/A   Psychiatric Specialty Exam:   Presentation  General Appearance:  Appropriate for Environment   Eye Contact: Fair   Speech: Clear and Coherent   Speech  Volume: Normal   Handedness: Right     Mood and Affect  Mood: " About the same, depressed"   Affect: Congruent; Constricted     Thought Processes: Coherent; Goal Directed   Descriptions of Associations:Intact   Orientation:Full (Time, Place and Person)   Thought Content:Abstract Reasoning; Logical; Rumination   History of Schizophrenia/Schizoaffective disorder:No   Duration of Psychotic Symptoms:N/A   Hallucinations:Hallucinations: None   Ideas of Reference:None   Suicidal Thoughts:Suicidal Thoughts: Yes, Active    Homicidal Thoughts:Homicidal Thoughts: No     Sensorium  Memory: Immediate Fair; Recent Fair; Remote Fair   Judgment: Impaired   Insight: Shallow     Executive Functions  Concentration: Fair   Attention Span: Fair   Recall: Fair   Fund of Knowledge: Good   Language: Fair       Psychomotor Activity: Psychomotor Activity: Decreased     Assets: Communication Skills; Financial Resources/Insurance     Sleep: Sleep: Poor     Physical Exam Constitutional:      Appearance: Normal appearance.  HENT:     Head: Normocephalic and atraumatic.     Nose: Nose normal.  Eyes:     Pupils: Pupils are equal, round, and reactive to light.  Cardiovascular:     Rate and Rhythm: Normal rate.  Pulmonary:     Effort: Pulmonary effort is normal.  Skin:    General: Skin is warm.  Neurological:     General: No focal deficit present.     Mental Status: He is alert and oriented to person, place, and time.      Review of Systems  Constitutional:  Positive for malaise/fatigue.  HENT:  Negative for congestion and hearing loss.   Eyes:  Negative for blurred vision and double vision.  Respiratory:  Negative for cough and shortness of breath.   Cardiovascular:  Negative for chest  pain and palpitations.  Gastrointestinal:  Negative for heartburn, nausea and vomiting.  Neurological:  Negative for dizziness and speech change.   Psychiatric/Behavioral:  Positive for depression and suicidal ideas. The patient is nervous/anxious and has insomnia.    Blood pressure (!) 136/97, pulse 80, temperature (!) 97.3 F (36.3 C), resp. rate 17, height 5\' 9"  (1.753 m), weight 79.8 kg, SpO2 99%. Body mass index is 25.99 kg/m.   Treatment Plan Summary: Daily contact with patient to assess and evaluate symptoms and progress in treatment and Medication management  Patient is admitted to locked unit under safety precautions Continue Depakote 500 mg by mouth twice daily for seizures and to target mood symptoms Increase the dose of Remeron to 15 mg at bedtime to help with depression, 09/17/2022 Continue home medicine for hypertension, metoprolol XL 50 mg daily Continue on BuSpar 5 mg by mouth twice daily for anxiety Side effects of medicine discussed with patient, patient agrees with the plan Social worker consult to discuss housing options and help with a safe discharge plan Continue to encourage sobriety Patient was encouraged to attend group and work on coping strategies Will recommend individual therapy upon discharge Will give baclofen to help muscle tension  Lewanda Rife, MD

## 2022-09-18 NOTE — [facility] Group Notes (Signed)
Recreational activity in the courtyard -music and games played. Pt did not attend.

## 2022-09-19 DIAGNOSIS — F3130 Bipolar disorder, current episode depressed, mild or moderate severity, unspecified: Secondary | ICD-10-CM | POA: Diagnosis not present

## 2022-09-19 DIAGNOSIS — F4310 Post-traumatic stress disorder, unspecified: Secondary | ICD-10-CM | POA: Diagnosis not present

## 2022-09-19 MED ORDER — BACLOFEN 10 MG PO TABS
5.0000 mg | ORAL_TABLET | Freq: Two times a day (BID) | ORAL | Status: DC
  Administered 2022-09-19 – 2022-10-07 (×37): 5 mg via ORAL
  Filled 2022-09-19 (×38): qty 0.5

## 2022-09-19 NOTE — Group Note (Signed)
Date:  09/19/2022 Time:  10:44 PM  Group Topic/Focus:  Developing a Wellness Toolbox:   The focus of this group is to help patients develop a "wellness toolbox" with skills and strategies to promote recovery upon discharge.    Participation Level:  Active  Participation Quality:  Appropriate  Affect:  Appropriate  Cognitive:  Appropriate  Insight: Improving  Engagement in Group:  Improving  Modes of Intervention:  Discussion  Additional Comments:    Maeola Harman 09/19/2022, 10:44 PM

## 2022-09-19 NOTE — Progress Notes (Signed)
[facility] MD Progress Note  09/19/2022 [name]  MRN:  [id number]  Subjective: Case discussed with staff, chart reviewed, patient seen during rounds.  Staff reports the feels "little better" today. He reports Baclofen helped with muscle tension and headaches. Reports he was able to sleep better  Patient had made an attempt to attend group   Patient continues to report suicidal thoughts, denies any intention to harm himself on the unit.  Denies homicidal ideations.  Patient denies auditory or visual hallucinations.    Principal Problem: Bipolar I disorder, most recent episode depressed (HCC) Diagnosis: Principal Problem:   Bipolar I disorder, most recent episode depressed (HCC) Active Problems:   PTSD (post-traumatic stress disorder)    Past Psychiatric History: Reports h/o Bipolar disorder, anxiety and PTSD. Reports  multiple psychiatric hospitalization. Denies past suicide attempts.   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 echo 5/17: EF 60-65%, no RWMA, LV dias fxn nl, LA mildly dilated, RV sys fxn nl, PASP nl, moderate sized circumferential pericardial effusion was identified, 2.12 cm around the LV free wall, <1 cm around the RV free wall. Features were not c/w tamponade physiology   PTSD (post-traumatic stress disorder)    Witnessed brother's suicide.   Restless leg syndrome    Seizures (HCC)    Syncope     Past Surgical History:  Procedure Laterality Date   CYSTOSCOPY WITH URETEROSCOPY AND STENT PLACEMENT     ESOPHAGOGASTRODUODENOSCOPY N/A 01/11/2016   Procedure: ESOPHAGOGASTRODUODENOSCOPY (EGD);  Surgeon: Charlott Rakes, MD;  Location: [facility] ENDOSCOPY;  Service: Endoscopy;  Laterality: N/A;   ESOPHAGOGASTRODUODENOSCOPY N/A 04/09/2020   Procedure: ESOPHAGOGASTRODUODENOSCOPY (EGD);  Surgeon: [name], MD;  Location: [facility] ENDOSCOPY;  Service: Gastroenterology;  Laterality: N/A;   INCISION AND DRAINAGE ABSCESS Left 01/02/2018   Procedure: INCISION AND DRAINAGE LEFT KNEE;  Surgeon: Deeann Saint, MD;  Location: [facility] ORS;  Service: Orthopedics;  Laterality: Left;   JOINT REPLACEMENT Right    TKR   KNEE ARTHROSCOPY Right 06/25/2014   Procedure: ARTHROSCOPY KNEE;  Surgeon: Deeann Saint, MD;  Location: [facility] ORS;  Service: Orthopedics;  Laterality: Right;  partial arthroscopic medial menisectomy   LAPAROSCOPIC APPENDECTOMY N/A 06/02/2021   Procedure: APPENDECTOMY LAPAROSCOPIC;  Surgeon: Campbell Lerner, MD;  Location: [facility] ORS;  Service: General;  Laterality: N/A;   TOTAL KNEE ARTHROPLASTY Right 04/22/2015   Procedure: TOTAL KNEE ARTHROPLASTY;  Surgeon: Deeann Saint, MD;  Location: [facility] ORS;  Service: Orthopedics;  Laterality: Right;   TOTAL KNEE ARTHROPLASTY Left 10/30/2017   Procedure: TOTAL KNEE ARTHROPLASTY;  Surgeon: Deeann Saint, MD;  Location: [facility] ORS;  Service: Orthopedics;  Laterality: Left;   TOTAL KNEE REVISION Left 01/02/2018   Procedure: poly exchange of tibia and patella left knee;  Surgeon: Deeann Saint, MD;  Location: [facility] ORS;  Service: Orthopedics;  Laterality: Left;   UMBILICAL HERNIA REPAIR  06/02/2021   Procedure: HERNIA REPAIR UMBILICAL ADULT;  Surgeon: Campbell Lerner, MD;  Location: [facility] ORS;  Service: General;;   Family History:  Family History  Problem Relation Age  of Onset   CVA Mother        deceased at age 1   Depression Brother        Died by suicide at age 80    Social History:  Social History   Substance and Sexual Activity  Alcohol Use Not Currently   Comment: rare     Social History   Substance and Sexual Activity  Drug Use Yes   Types: Marijuana,  Cocaine    Social History   Socioeconomic History   Marital status: Single    Spouse name: Not on file   Number of children: Not on file   Years of education: Not on file   Highest education level: Not on file  Occupational History   Not on file  Tobacco Use   Smoking status: Former    Current packs/day: 0.00    Average packs/day: 0.8 packs/day for 20.0 years (15.0 ttl pk-yrs)    Types: Cigarettes    Start date: 05/16/1964    Quit date: 05/16/1984    Years since quitting: 38.3   Smokeless tobacco: Never  Vaping Use   Vaping status: Never Used  Substance and Sexual Activity   Alcohol use: Not Currently    Comment: rare   Drug use: Yes    Types: Marijuana, Cocaine   Sexual activity: Not on file  Other Topics Concern   Not on file  Social History Narrative   ** Merged History Encounter **       Social Determinants of Health   Financial Resource Strain: Not on file  Food Insecurity: Food Insecurity Present (09/15/2022)   Hunger Vital Sign    Worried About Running Out of Food in the Last Year: Sometimes true    Ran Out of Food in the Last Year: Sometimes true  Transportation Needs: Unmet Transportation Needs (09/15/2022)   PRAPARE - Administrator, Civil Service (Medical): Yes    Lack of Transportation (Non-Medical): Yes  Physical Activity: Not on file  Stress: Not on file  Social Connections: Not on file   Additional Social History:                         Sleep: Improved  Appetite:  Fair  Current Medications: Current Facility-Administered Medications  Medication Dose Route Frequency Provider Last Rate Last Admin   acetaminophen (TYLENOL) tablet 650 mg  650 mg Oral Q6H PRN Onuoha, Chinwendu V, NP   650 mg at 09/19/22 1014   alum & mag hydroxide-simeth (MAALOX/MYLANTA) 200-200-20 MG/5ML suspension 30 mL  30 mL Oral Q4H PRN Onuoha, Chinwendu V, NP       busPIRone (BUSPAR) tablet 5 mg  5 mg Oral BID Lewanda Rife, MD   5 mg at 09/19/22 1008    divalproex (DEPAKOTE) DR tablet 500 mg  500 mg Oral BID Lewanda Rife, MD   500 mg at 09/19/22 1008   haloperidol (HALDOL) tablet 5 mg  5 mg Oral TID PRN Onuoha, Chinwendu V, NP       Or   haloperidol lactate (HALDOL) injection 5 mg  5 mg Intramuscular TID PRN Onuoha, Chinwendu V, NP       hydrOXYzine (ATARAX) tablet 25 mg  25 mg Oral TID PRN Onuoha, Chinwendu V, NP   25 mg at 09/19/22 1014   magnesium hydroxide (MILK OF MAGNESIA) suspension 30 mL  30 mL Oral Daily PRN Onuoha, Chinwendu V, NP       metoprolol succinate (  TOPROL-XL) 24 hr tablet 50 mg  50 mg Oral Daily Lewanda Rife, MD   50 mg at 09/19/22 1008   mirtazapine (REMERON) tablet 15 mg  15 mg Oral QHS Lewanda Rife, MD   15 mg at 09/18/22 2123   traZODone (DESYREL) tablet 100 mg  100 mg Oral QHS PRN Lewanda Rife, MD   100 mg at 09/18/22 2122    Lab Results: No results found for this or any previous visit (from the past 48 hour(s)).  Blood Alcohol level:  Lab Results  Component Value Date   ETH <10 09/15/2022   ETH <10 07/05/2022    Metabolic Disorder Labs: Lab Results  Component Value Date   HGBA1C 5.5 04/16/2022   MPG 111 04/16/2022   MPG 105.41 01/18/2021   No results found for: "PROLACTIN" Lab Results  Component Value Date   CHOL 184 04/16/2022   TRIG 133 04/16/2022   HDL 32 (L) 04/16/2022   CHOLHDL 5.8 04/16/2022   VLDL 27 04/16/2022   LDLCALC 125 (H) 04/16/2022   LDLCALC 128 (H) 01/18/2021    Physical Findings: AIMS:  , ,  ,  ,    CIWA:    COWS:     Musculoskeletal: Strength & Muscle Tone: within normal limits Gait & Station: normal Patient leans: N/A   Psychiatric Specialty Exam:   Presentation  General Appearance:  Appropriate for Environment   Eye Contact: Fair   Speech: Clear and Coherent   Speech Volume: Normal   Handedness: Right     Mood and Affect  Mood: " Little better"   Affect: Congruent; Constricted     Thought Processes: Coherent; Goal Directed    Descriptions of Associations:Intact   Orientation:Full (Time, Place and Person)   Thought Content:Abstract Reasoning; Logical; Rumination   History of Schizophrenia/Schizoaffective disorder:No   Duration of Psychotic Symptoms:N/A   Hallucinations:Hallucinations: None   Ideas of Reference:None   Suicidal Thoughts:Suicidal Thoughts: Yes, Active    Homicidal Thoughts:Homicidal Thoughts: No     Sensorium  Memory: Immediate Fair; Recent Fair; Remote Fair   Judgment: Impaired   Insight: Shallow     Executive Functions  Concentration: Fair   Attention Span: Fair   Recall: Fair   Fund of Knowledge: Good   Language: Fair       Psychomotor Activity: Psychomotor Activity: Decreased     Assets: Communication Skills; Financial Resources/Insurance     Sleep: Sleep: Poor     Physical Exam Constitutional:      Appearance: Normal appearance.  HENT:     Head: Normocephalic and atraumatic.     Nose: Nose normal.  Eyes:     Pupils: Pupils are equal, round, and reactive to light.  Cardiovascular:     Rate and Rhythm: Normal rate.  Pulmonary:     Effort: Pulmonary effort is normal.  Skin:    General: Skin is warm.  Neurological:     General: No focal deficit present.     Mental Status: He is alert and oriented to person, place, and time.      Review of Systems  Constitutional:  Positive for malaise/fatigue.  HENT:  Negative for congestion and hearing loss.   Eyes:  Negative for blurred vision and double vision.  Respiratory:  Negative for cough and shortness of breath.   Cardiovascular:  Negative for chest pain and palpitations.  Gastrointestinal:  Negative for heartburn, nausea and vomiting.  Neurological:  Negative for dizziness and speech change.  Psychiatric/Behavioral:  Positive for depression and  suicidal ideas.   Blood pressure (!) 131/91, pulse 77, temperature 97.9 F (36.6 C), resp. rate 16, height 5\' 9"  (1.753 m), weight 79.8 kg, SpO2  99%. Body mass index is 25.99 kg/m.   Treatment Plan Summary: Daily contact with patient to assess and evaluate symptoms and progress in treatment and Medication management  Patient is admitted to locked unit under safety precautions Continue Depakote 500 mg by mouth twice daily for seizures and to target mood symptoms Increase the dose of Remeron to 15 mg at bedtime to help with depression, 09/17/2022 Continue home medicine for hypertension, metoprolol XL 50 mg daily Continue on BuSpar 5 mg by mouth twice daily for anxiety Side effects of medicine discussed with patient, patient agrees with the plan Social worker consult to discuss housing options and help with a safe discharge plan Continue to encourage sobriety Patient was encouraged to attend group and work on coping strategies Will recommend individual therapy upon discharge Will start on  baclofen 5 mg po BID to help muscle tension  Lewanda Rife, MD

## 2022-09-19 NOTE — Group Note (Signed)
Recreation Therapy Group Note   Group Topic:General Recreation  Group Date: 09/19/2022 Start Time: 1400 End Time: 1445 Facilitators: Rosina Lowenstein, LRT, CTRS Location: Courtyard  Group Description: Emotional Check in. Patient sat and talked with LRT about how they are doing and whatever else is on their mind. LRT provided active listening, reassurance and encouragement. Pts were given the opportunity to listen to music or play cornhole while getting fresh air and sunlight in the courtyard.    Goal Area(s) Addressed: Patient will engage in conversation with LRT. Patient will communicate their wants, needs, or questions.  Patient will practice a new coping skill of "talking to someone".   Affect/Mood: N/A   Participation Level: Did not attend    Clinical Observations/Individualized Feedback: [name] did not attend group.  Plan: Continue to engage patient in RT group sessions 2-3x/week.   Rosina Lowenstein, LRT, CTRS 09/19/2022 3:43 PM

## 2022-09-19 NOTE — Group Note (Deleted)

## 2022-09-19 NOTE — Progress Notes (Signed)
Patient pleasant and cooperative.  Endorses anxiety, depression and AH. Passive SI with no plan.  "I just don't want to be here (alive) anymore." Contracts for safety on the unit. PRN medications given as ordered for anxiety and pain.  Pain rated 7/10 in head.   Baclofen ordered BID for pain / muscle tension.   Compliant with scheduled medications.  15 min checks in place for safety.  Isolates to room with the exception of meals.  Minimal interaction with staff and peers.

## 2022-09-19 NOTE — Progress Notes (Signed)
D: Pt alert and oriented. Pt reported   anxiety and depression of a 9/10 for both. Pt denies any pain. Pt denies  experiencing any SI/HI at this time..   A: Scheduled  medications administered to pt  per MD orders.  Support and encouragement provided. Frequent verbal contact made. Routine safety checks conducted q15 minutes.   R: No adverse drug reactions noted. Pt verbally contracts for safety at this time. Pt compliant with medications and treatment plan.  Pt interacts well with others on the unit. Pt remains safe at this time. Plan of care ongoing.

## 2022-09-20 DIAGNOSIS — F3130 Bipolar disorder, current episode depressed, mild or moderate severity, unspecified: Principal | ICD-10-CM

## 2022-09-20 MED ORDER — TRAZODONE HCL 50 MG PO TABS
150.0000 mg | ORAL_TABLET | Freq: Every day | ORAL | Status: DC
  Administered 2022-09-20: 150 mg via ORAL
  Filled 2022-09-20: qty 1

## 2022-09-20 NOTE — Progress Notes (Signed)
   09/20/22 2100  Psych Admission Type (Psych Patients Only)  Admission Status Voluntary  Psychosocial Assessment  Patient Complaints Anxiety;Depression;Insomnia;Hopelessness;Other (Comment) (PTSD)  Eye Contact Fair  Facial Expression Angry  Affect Angry;Depressed  Speech Logical/coherent  Interaction Assertive  Motor Activity Other (Comment) (appropriate)  Appearance/Hygiene In scrubs  Behavior Characteristics Cooperative;Appropriate to situation;Anxious  Mood Depressed;Pleasant  Thought Process  Coherency WDL  Content WDL  Delusions None reported or observed  Perception Hallucinations  Hallucination Auditory  Judgment Impaired  Confusion None  Danger to Self  Current suicidal ideation? Passive  Agreement Not to Harm Self Yes  Description of Agreement verbal  Danger to Others  Danger to Others None reported or observed

## 2022-09-20 NOTE — Group Note (Signed)
Recreation Therapy Group Note   Group Topic:Coping Skills  Group Date: 09/20/2022 Start Time: 1400 End Time: 1455 Facilitators: Rosina Lowenstein, LRT, CTRS Location:  Day Room  Group Description: Chair Yoga. LRT and patients discussed the benefits of yoga and how it differs from strength exercises. LRT educated patients on the mental and physical benefits of yoga and deep breathing and how it can be used as a Associate Professor. LRT and patients followed along to a guided yoga session on the television that focused on all parts of the body, as well as deep breathing. Pt encouraged to stop movement at any time if they feel discomfort or pain.   Goal Area(s) Addressed: Patient will practice using relaxation technique. Patient will identify a new coping skill.  Patient will follow multistep directions to reduce anxiety and stress.   Affect/Mood: N/A   Participation Level: Did not attend    Clinical Observations/Individualized Feedback: [name] did not attend group.  Plan: Continue to engage patient in RT group sessions 2-3x/week.   Rosina Lowenstein, LRT, CTRS 09/20/2022 3:20 PM

## 2022-09-20 NOTE — Progress Notes (Signed)
Patient present in the dayroom for breakfast.  Endorses anxiety, depression, AH, flashbacks and passive SI.  Contracts for safety on the unit.  Poor sleep.  Denies HI and VH.  Denies pain.  Compliant with scheduled mediations. 15 min checks in place for safety.  Isolates to room with the exception of meals. Minimal interaction with peers and staff.   PRN medications given for anxiety and pain.

## 2022-09-20 NOTE — Group Note (Signed)
Date:  09/20/2022 Time:  8:22 PM  Group Topic/Focus:  Wrap-Up Group:   The focus of this group is to help patients review their daily goal of treatment and discuss progress on daily workbooks.    Participation Level:  Active  Participation Quality:  Appropriate  Affect:  Appropriate  Cognitive:  Appropriate  Insight: Appropriate  Engagement in Group:  Engaged  Modes of Intervention:  Discussion  Additional Comments:    Osker Mason 09/20/2022, 8:22 PM

## 2022-09-20 NOTE — Progress Notes (Signed)
[name] is alert & oriented x 4.  During initial rounds he reported to the NT that he was feeling down.  This Clinical research associate spoke with him & he denied SI, HI, delusions, and hallucinations.  He reported feeling anxious & uneasy  He was given Hydroxyzine 25mg  PO.  He initially declined an offer to get out of his room to walk around, but later came to the dayroom.    He attended group with one other patient.  [name] participated, smiled, and laughed as he socialized in the dayroom.  [name] is pleasant & talkative when engaged.    He was given PO 650mg  of Acetaminophen @ 21:21 for c/o an headache 5/10.  He later reported relief.  Continue in place Plan of Care.  Q48m safety completed as per protocol.

## 2022-09-20 NOTE — Progress Notes (Signed)
[facility] MD Progress Note  09/20/2022 11:45 AM [name]  MRN:  [id number] Subjective: [name] is seen on rounds.  This is a readmission for him.  He was here back in June.  He says that he is still on his medications but does not know what he takes except for Depakote.  He says he is living in [address] with a roommate.  He is currently on Remeron and BuSpar, also.  He complains of not sleeping very well.  He did take trazodone last night.  He also took hydroxyzine at bedtime.  I am going up on his trazodone before I change it. Principal Problem: Bipolar I disorder, most recent episode depressed (HCC) Diagnosis: Principal Problem:   Bipolar I disorder, most recent episode depressed (HCC) Active Problems:   PTSD (post-traumatic stress disorder)  Total Time spent with patient: 15 minutes  Past Psychiatric History: Long history of bipolar disorder and admissions.  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 echo 5/17: EF 60-65%, no RWMA, LV dias fxn nl, LA mildly dilated, RV sys fxn nl, PASP nl, moderate sized circumferential pericardial effusion was identified, 2.12 cm around the LV free wall, <1 cm around the RV free wall. Features were not c/w tamponade physiology   PTSD (post-traumatic stress disorder)    Witnessed brother's suicide.   Restless leg syndrome    Seizures (HCC)    Syncope     Past Surgical History:  Procedure Laterality Date   CYSTOSCOPY WITH URETEROSCOPY AND STENT PLACEMENT     ESOPHAGOGASTRODUODENOSCOPY N/A 01/11/2016   Procedure: ESOPHAGOGASTRODUODENOSCOPY (EGD);  Surgeon: Charlott Rakes, MD;  Location: [facility]  ENDOSCOPY;  Service: Endoscopy;  Laterality: N/A;   ESOPHAGOGASTRODUODENOSCOPY N/A 04/09/2020   Procedure: ESOPHAGOGASTRODUODENOSCOPY (EGD);  Surgeon: [name], MD;  Location: [facility] ENDOSCOPY;  Service: Gastroenterology;  Laterality: N/A;   INCISION AND DRAINAGE ABSCESS Left 01/02/2018   Procedure: INCISION AND DRAINAGE LEFT KNEE;  Surgeon: Deeann Saint, MD;  Location: [facility] ORS;  Service: Orthopedics;  Laterality: Left;   JOINT REPLACEMENT Right    TKR   KNEE ARTHROSCOPY Right 06/25/2014   Procedure: ARTHROSCOPY KNEE;  Surgeon: Deeann Saint, MD;  Location: [facility] ORS;  Service: Orthopedics;  Laterality: Right;  partial arthroscopic medial menisectomy   LAPAROSCOPIC APPENDECTOMY N/A 06/02/2021   Procedure: APPENDECTOMY LAPAROSCOPIC;  Surgeon: Campbell Lerner, MD;  Location: [facility] ORS;  Service: General;  Laterality: N/A;   TOTAL KNEE ARTHROPLASTY Right 04/22/2015   Procedure: TOTAL KNEE ARTHROPLASTY;  Surgeon: Deeann Saint, MD;  Location: [facility] ORS;  Service: Orthopedics;  Laterality: Right;   TOTAL KNEE ARTHROPLASTY Left 10/30/2017   Procedure: TOTAL KNEE ARTHROPLASTY;  Surgeon: Deeann Saint, MD;  Location: [facility] ORS;  Service: Orthopedics;  Laterality: Left;   TOTAL KNEE REVISION Left 01/02/2018   Procedure: poly exchange of tibia and patella left knee;  Surgeon: Deeann Saint, MD;  Location: [facility] ORS;  Service: Orthopedics;  Laterality: Left;   UMBILICAL HERNIA REPAIR  06/02/2021   Procedure: HERNIA REPAIR UMBILICAL ADULT;  Surgeon: Campbell Lerner, MD;  Location: [facility] ORS;  Service: General;;   Family History:  Family History  Problem Relation Age of Onset   CVA Mother        deceased at age 45   Depression Brother        Died by suicide at age 83   Family Psychiatric  History: Unremarkable Social History:  Social History   Substance and Sexual Activity  Alcohol Use Not Currently   Comment: rare     Social History   Substance and Sexual Activity  Drug Use Yes   Types:  Marijuana, Cocaine    Social History   Socioeconomic History   Marital status: Single    Spouse name: Not on file   Number of children: Not on file   Years of education: Not on file   Highest education level: Not on file  Occupational History   Not on file  Tobacco Use   Smoking status: Former    Current packs/day: 0.00    Average packs/day: 0.8 packs/day for 20.0 years (15.0 ttl pk-yrs)    Types: Cigarettes    Start date: 05/16/1964    Quit date: 05/16/1984    Years since quitting: 38.3   Smokeless tobacco: Never  Vaping Use   Vaping status: Never Used  Substance and Sexual Activity   Alcohol use: Not Currently    Comment: rare   Drug use: Yes    Types: Marijuana, Cocaine   Sexual activity: Not on file  Other Topics Concern   Not on file  Social History Narrative   ** Merged History Encounter **       Social Determinants of Health   Financial Resource Strain: Not on file  Food Insecurity: Food Insecurity Present (09/15/2022)   Hunger Vital Sign    Worried About Running Out of Food in the Last Year: Sometimes true    Ran Out of Food in the Last Year: Sometimes true  Transportation Needs: Unmet Transportation Needs (09/15/2022)   PRAPARE - Administrator, Civil Service (Medical): Yes    Lack of Transportation (Non-Medical): Yes  Physical Activity: Not on file  Stress: Not on file  Social Connections: Not on file   Additional Social History:                         Sleep: Poor  Appetite:  Fair  Current Medications: Current Facility-Administered Medications  Medication Dose Route Frequency Provider Last Rate Last Admin   acetaminophen (TYLENOL) tablet 650 mg  650 mg Oral Q6H PRN Onuoha, Chinwendu V, NP   650 mg at 09/19/22 2121   alum & mag hydroxide-simeth (MAALOX/MYLANTA) 200-200-20 MG/5ML suspension 30 mL  30 mL Oral Q4H PRN Onuoha, Chinwendu V, NP       baclofen (LIORESAL) tablet 5 mg  5 mg Oral BID Lewanda Rife, MD   5 mg at 09/20/22  0914   busPIRone (BUSPAR) tablet 5 mg  5 mg Oral BID Lewanda Rife, MD   5 mg at 09/20/22 0914   divalproex (DEPAKOTE) DR tablet 500 mg  500 mg Oral BID Lewanda Rife, MD   500 mg at 09/20/22 0913   haloperidol (HALDOL) tablet 5 mg  5 mg Oral TID PRN Onuoha, Chinwendu V, NP       Or   haloperidol lactate (HALDOL) injection 5 mg  5 mg Intramuscular TID PRN Onuoha, Chinwendu V, NP       hydrOXYzine (ATARAX) tablet 25 mg  25  mg Oral TID PRN Onuoha, Chinwendu V, NP   25 mg at 09/20/22 0914   magnesium hydroxide (MILK OF MAGNESIA) suspension 30 mL  30 mL Oral Daily PRN Onuoha, Chinwendu V, NP       metoprolol succinate (TOPROL-XL) 24 hr tablet 50 mg  50 mg Oral Daily Lewanda Rife, MD   50 mg at 09/20/22 0913   mirtazapine (REMERON) tablet 15 mg  15 mg Oral QHS Lewanda Rife, MD   15 mg at 09/19/22 2123   traZODone (DESYREL) tablet 100 mg  100 mg Oral QHS PRN Lewanda Rife, MD   100 mg at 09/19/22 2122    Lab Results: No results found for this or any previous visit (from the past 48 hour(s)).  Blood Alcohol level:  Lab Results  Component Value Date   ETH <10 09/15/2022   ETH <10 07/05/2022    Metabolic Disorder Labs: Lab Results  Component Value Date   HGBA1C 5.5 04/16/2022   MPG 111 04/16/2022   MPG 105.41 01/18/2021   No results found for: "PROLACTIN" Lab Results  Component Value Date   CHOL 184 04/16/2022   TRIG 133 04/16/2022   HDL 32 (L) 04/16/2022   CHOLHDL 5.8 04/16/2022   VLDL 27 04/16/2022   LDLCALC 125 (H) 04/16/2022   LDLCALC 128 (H) 01/18/2021    Physical Findings: AIMS:  , ,  ,  ,    CIWA:    COWS:     Musculoskeletal: Strength & Muscle Tone: within normal limits Gait & Station: normal Patient leans: N/A  Psychiatric Specialty Exam:  Presentation  General Appearance:  Appropriate for Environment  Eye Contact: Fair  Speech: Clear and Coherent  Speech Volume: Normal  Handedness: Right   Mood and Affect  Mood: Anxious;  Depressed  Affect: Congruent; Constricted   Thought Process  Thought Processes: Coherent; Goal Directed  Descriptions of Associations:Intact  Orientation:Full (Time, Place and Person)  Thought Content:Abstract Reasoning; Logical; Rumination  History of Schizophrenia/Schizoaffective disorder:No  Duration of Psychotic Symptoms:Greater than six months  Hallucinations:No data recorded Ideas of Reference:None  Suicidal Thoughts:No data recorded Homicidal Thoughts:No data recorded  Sensorium  Memory: Immediate Fair; Recent Fair; Remote Fair  Judgment: Impaired  Insight: Shallow   Executive Functions  Concentration: Fair  Attention Span: Fair  Recall: Fair  Fund of Knowledge: Good  Language: Fair   Psychomotor Activity  Psychomotor Activity:No data recorded  Assets  Assets: Communication Skills; Financial Resources/Insurance   Sleep  Sleep:No data recorded   Blood pressure (!) 129/93, pulse 70, temperature 97.7 F (36.5 C), resp. rate 20, height 5\' 9"  (1.753 m), weight 79.8 kg, SpO2 97%. Body mass index is 25.99 kg/m.   Treatment Plan Summary: Daily contact with patient to assess and evaluate symptoms and progress in treatment, Medication management, and Plan increase trazodone to 150 mg at bedtime.  Sarina Ill, DO 09/20/2022, 11:45 AM

## 2022-09-21 DIAGNOSIS — F3130 Bipolar disorder, current episode depressed, mild or moderate severity, unspecified: Secondary | ICD-10-CM | POA: Diagnosis not present

## 2022-09-21 MED ORDER — QUETIAPINE FUMARATE 100 MG PO TABS
100.0000 mg | ORAL_TABLET | Freq: Every day | ORAL | Status: DC
  Administered 2022-09-21: 100 mg via ORAL
  Filled 2022-09-21: qty 1

## 2022-09-21 MED ORDER — RISPERIDONE 1 MG PO TABS
0.5000 mg | ORAL_TABLET | ORAL | Status: DC
  Administered 2022-09-21 – 2022-09-26 (×10): 0.5 mg via ORAL
  Filled 2022-09-21 (×10): qty 1

## 2022-09-21 NOTE — Group Note (Unsigned)
Date:  09/22/2022 Time:  12:22 AM  Group Topic/Focus:  Healthy Communication:   The focus of this group is to discuss communication, barriers to communication, as well as healthy ways to communicate with others.    Participation Level:  Active  Participation Quality:  Appropriate  Affect:  Appropriate  Cognitive:  Appropriate  Insight: Improving  Engagement in Group:  Improving  Modes of Intervention:  Discussion  Additional Comments:    Maeola Harman 09/22/2022, 12:22 AM

## 2022-09-21 NOTE — Group Note (Signed)

## 2022-09-21 NOTE — Progress Notes (Signed)
   09/21/22 0732  Psych Admission Type (Psych Patients Only)  Admission Status Voluntary  Psychosocial Assessment  Patient Complaints Anxiety  Eye Contact Fair  Facial Expression Grimacing  Affect Blunted  Speech Logical/coherent  Interaction Assertive  Motor Activity Other (Comment)  Appearance/Hygiene In scrubs  Behavior Characteristics Cooperative  Mood Depressed;Anxious  Thought Process  Coherency WDL  Content WDL  Delusions None reported or observed  Perception WDL  Hallucination None reported or observed  Judgment Impaired  Confusion None  Danger to Self  Current suicidal ideation? Passive

## 2022-09-21 NOTE — Plan of Care (Signed)
Patient lays in bed all day and doesn't participate in much except meals.  Problem: Education: Goal: Knowledge of General Education information will improve Description: Including pain rating scale, medication(s)/side effects and non-pharmacologic comfort measures Outcome: Not Progressing   Problem: Health Behavior/Discharge Planning: Goal: Ability to manage health-related needs will improve Outcome: Not Progressing   Problem: Clinical Measurements: Goal: Ability to maintain clinical measurements within normal limits will improve Outcome: Not Progressing Goal: Will remain free from infection Outcome: Not Progressing Goal: Diagnostic test results will improve Outcome: Not Progressing Goal: Respiratory complications will improve Outcome: Not Progressing Goal: Cardiovascular complication will be avoided Outcome: Not Progressing   Problem: Activity: Goal: Risk for activity intolerance will decrease Outcome: Not Progressing   Problem: Nutrition: Goal: Adequate nutrition will be maintained Outcome: Not Progressing   Problem: Coping: Goal: Level of anxiety will decrease Outcome: Not Progressing   Problem: Elimination: Goal: Will not experience complications related to bowel motility Outcome: Not Progressing Goal: Will not experience complications related to urinary retention Outcome: Not Progressing   Problem: Pain Managment: Goal: General experience of comfort will improve Outcome: Not Progressing   Problem: Safety: Goal: Ability to remain free from injury will improve Outcome: Not Progressing   Problem: Skin Integrity: Goal: Risk for impaired skin integrity will decrease Outcome: Not Progressing

## 2022-09-21 NOTE — Progress Notes (Signed)
   09/21/22 0602  15 Minute Checks  Location Bedroom  Visual Appearance Calm  Behavior Sleeping  Sleep (Behavioral Health Patients Only)  Calculate sleep? (Click Yes once per 24 hr at 0600 safety check) Yes  Documented sleep last 24 hours 10.25

## 2022-09-21 NOTE — Group Note (Signed)
Recreation Therapy Group Note   Group Topic:Other  Group Date: 09/21/2022 Start Time: 1400 End Time: 1455 Facilitators: Rosina Lowenstein, LRT, CTRS Location:  Craft Room and Courtyard   Group Description: Bingo. LRT and patients played multiple games of Bingo with music playing in the background. LRT and pts discussed how this could be a leisure interest and the importance of doing things they enjoy post-discharge.   Goal Area(s) Addressed: Patient will identify leisure interests.  Patient will practice healthy decision making. Patient will engage in recreation activity.    Affect/Mood: N/A   Participation Level: Did not attend    Clinical Observations/Individualized Feedback: [name] did not attend group.  Plan: Continue to engage patient in RT group sessions 2-3x/week.   Rosina Lowenstein, LRT, CTRS 09/21/2022 3:14 PM

## 2022-09-21 NOTE — BH IP Treatment Plan (Signed)
Interdisciplinary Treatment and Diagnostic Plan Update  09/21/2022 Time of Session: 9:00 AM  [name] MRN: [id number]  Principal Diagnosis: Bipolar I disorder, most recent episode depressed (HCC)  Secondary Diagnoses: Principal Problem:   Bipolar I disorder, most recent episode depressed (HCC) Active Problems:   PTSD (post-traumatic stress disorder)   Current Medications:  Current Facility-Administered Medications  Medication Dose Route Frequency Provider Last Rate Last Admin   acetaminophen (TYLENOL) tablet 650 mg  650 mg Oral Q6H PRN Onuoha, Chinwendu V, NP   650 mg at 09/21/22 0904   alum & mag hydroxide-simeth (MAALOX/MYLANTA) 200-200-20 MG/5ML suspension 30 mL  30 mL Oral Q4H PRN Onuoha, Chinwendu V, NP       baclofen (LIORESAL) tablet 5 mg  5 mg Oral BID Lewanda Rife, MD   5 mg at 09/21/22 0908   busPIRone (BUSPAR) tablet 5 mg  5 mg Oral BID Lewanda Rife, MD   5 mg at 09/21/22 0904   divalproex (DEPAKOTE) DR tablet 500 mg  500 mg Oral BID Lewanda Rife, MD   500 mg at 09/21/22 8657   haloperidol (HALDOL) tablet 5 mg  5 mg Oral TID PRN Onuoha, Chinwendu V, NP       Or   haloperidol lactate (HALDOL) injection 5 mg  5 mg Intramuscular TID PRN Onuoha, Chinwendu V, NP       hydrOXYzine (ATARAX) tablet 25 mg  25 mg Oral TID PRN Onuoha, Chinwendu V, NP   25 mg at 09/21/22 0904   magnesium hydroxide (MILK OF MAGNESIA) suspension 30 mL  30 mL Oral Daily PRN Onuoha, Chinwendu V, NP       metoprolol succinate (TOPROL-XL) 24 hr tablet 50 mg  50 mg Oral Daily Lewanda Rife, MD   50 mg at 09/21/22 0904   mirtazapine (REMERON) tablet 15 mg  15 mg Oral QHS Lewanda Rife, MD   15 mg at 09/20/22 2141   traZODone (DESYREL) tablet 150 mg  150 mg Oral QHS Sarina Ill, DO   150 mg at 09/20/22 2140   PTA Medications: Medications Prior to Admission  Medication Sig Dispense Refill Last Dose   butalbital-acetaminophen-caffeine (FIORICET) 50-325-40 MG tablet  Take 1 tablet by mouth every 6 (six) hours as needed for headache or migraine.      divalproex (DEPAKOTE) 500 MG DR tablet Take 1 tablet (500 mg total) by mouth 2 (two) times daily. 60 tablet 3    doxepin (SINEQUAN) 100 MG capsule Take 100 mg by mouth at bedtime.      enalapril (VASOTEC) 20 MG tablet Take 20 mg by mouth daily. (Patient not taking: Reported on 09/15/2022)      enalapril (VASOTEC) 20 MG tablet Take 1 tablet (20 mg total) by mouth daily. (Patient not taking: Reported on 09/15/2022) 30 tablet 3    FLUoxetine (PROZAC) 20 MG capsule Take 1 capsule (20 mg total) by mouth daily. (Patient not taking: Reported on 09/15/2022) 30 capsule 0    FLUoxetine (PROZAC) 20 MG capsule Take 1 capsule (20 mg total) by mouth daily. (Patient not taking: Reported on 09/15/2022) 30 capsule 3    haloperidol (HALDOL) 5 MG tablet Take 5 mg by mouth every 6 (six) hours as needed for agitation. (Patient not taking: Reported on 09/15/2022)      metoprolol succinate (TOPROL-XL) 50 MG 24 hr tablet Take 1 tablet (50 mg total) by mouth daily. 30 tablet 3    risperiDONE (RISPERDAL) 1 MG tablet Take 1 tablet (1 mg total) by  mouth 2 (two) times daily at 8 am and 4 pm. (Patient not taking: Reported on 09/15/2022) 60 tablet 3    traZODone (DESYREL) 100 MG tablet Take 1 tablet (100 mg total) by mouth at bedtime as needed for sleep. 30 tablet 3    traZODone (DESYREL) 50 MG tablet Take 1 tablet (50 mg total) by mouth at bedtime as needed (Anxiety). (Patient not taking: Reported on 09/15/2022) 30 tablet 0    zolpidem (AMBIEN) 10 MG tablet Take 10 mg by mouth at bedtime.       Patient Stressors: Medication change or noncompliance    Patient Strengths: Ability for insight  Capable of independent living  Manufacturing systems engineer  Motivation for treatment/growth   Treatment Modalities: Medication Management, Group therapy, Case management,  1 to 1 session with clinician, Psychoeducation, Recreational therapy.   Physician Treatment Plan for  Primary Diagnosis: Bipolar I disorder, most recent episode depressed (HCC) Long Term Goal(s): Improvement in symptoms so as ready for discharge   Short Term Goals: Ability to identify changes in lifestyle to reduce recurrence of condition will improve Ability to verbalize feelings will improve Ability to disclose and discuss suicidal ideas Ability to demonstrate self-control will improve Ability to identify and develop effective coping behaviors will improve Ability to maintain clinical measurements within normal limits will improve Compliance with prescribed medications will improve Ability to identify triggers associated with substance abuse/mental health issues will improve  Medication Management: Evaluate patient's response, side effects, and tolerance of medication regimen.  Therapeutic Interventions: 1 to 1 sessions, Unit Group sessions and Medication administration.  Evaluation of Outcomes: Progressing  Physician Treatment Plan for Secondary Diagnosis: Principal Problem:   Bipolar I disorder, most recent episode depressed (HCC) Active Problems:   PTSD (post-traumatic stress disorder)  Long Term Goal(s): Improvement in symptoms so as ready for discharge   Short Term Goals: Ability to identify changes in lifestyle to reduce recurrence of condition will improve Ability to verbalize feelings will improve Ability to disclose and discuss suicidal ideas Ability to demonstrate self-control will improve Ability to identify and develop effective coping behaviors will improve Ability to maintain clinical measurements within normal limits will improve Compliance with prescribed medications will improve Ability to identify triggers associated with substance abuse/mental health issues will improve     Medication Management: Evaluate patient's response, side effects, and tolerance of medication regimen.  Therapeutic Interventions: 1 to 1 sessions, Unit Group sessions and Medication  administration.  Evaluation of Outcomes: Progressing   RN Treatment Plan for Primary Diagnosis: Bipolar I disorder, most recent episode depressed (HCC) Long Term Goal(s): Knowledge of disease and therapeutic regimen to maintain health will improve  Short Term Goals: Ability to remain free from injury will improve, Ability to verbalize frustration and anger appropriately will improve, Ability to demonstrate self-control, Ability to participate in decision making will improve, Ability to verbalize feelings will improve, Ability to disclose and discuss suicidal ideas, Ability to identify and develop effective coping behaviors will improve, and Compliance with prescribed medications will improve  Medication Management: RN will administer medications as ordered by provider, will assess and evaluate patient's response and provide education to patient for prescribed medication. RN will report any adverse and/or side effects to prescribing provider.  Therapeutic Interventions: 1 on 1 counseling sessions, Psychoeducation, Medication administration, Evaluate responses to treatment, Monitor vital signs and CBGs as ordered, Perform/monitor CIWA, COWS, AIMS and Fall Risk screenings as ordered, Perform wound care treatments as ordered.  Evaluation of Outcomes: Progressing   LCSW  Treatment Plan for Primary Diagnosis: Bipolar I disorder, most recent episode depressed (HCC) Long Term Goal(s): Safe transition to appropriate next level of care at discharge, Engage patient in therapeutic group addressing interpersonal concerns.  Short Term Goals: Engage patient in aftercare planning with referrals and resources, Increase social support, Increase ability to appropriately verbalize feelings, Increase emotional regulation, Facilitate acceptance of mental health diagnosis and concerns, and Increase skills for wellness and recovery  Therapeutic Interventions: Assess for all discharge needs, 1 to 1 time with Social  worker, Explore available resources and support systems, Assess for adequacy in community support network, Educate family and significant other(s) on suicide prevention, Complete Psychosocial Assessment, Interpersonal group therapy.  Evaluation of Outcomes: Progressing   Progress in Treatment: Attending groups: Yes. and No. Participating in groups: Yes. and No. Taking medication as prescribed: Yes. Toleration medication: Yes. Family/Significant other contact made: No, will contact:  CSW will contact if given permission  Patient understands diagnosis: Yes. Discussing patient identified problems/goals with staff: Yes. Medical problems stabilized or resolved: Yes. Denies suicidal/homicidal ideation: Yes. Issues/concerns per patient self-inventory: No. Other: None   New problem(s) identified: No, Describe:  None identified Update 09/21/22: No changes at this time    New Short Term/Long Term Goal(s): elimination of symptoms of psychosis, medication management for mood stabilization; elimination of SI thoughts; development of comprehensive mental wellness/sobriety plan. Update 09/21/22: No changes at this time    Patient Goals:  " I don't have any goals" Update 09/21/22: No changes at this time    Discharge Plan or Barriers: CSW will assist with appropriate discharge planning Update 09/21/22: No changes at this time    Reason for Continuation of Hospitalization: Depression Medication stabilization Suicidal ideation   Estimated Length of Stay: 1 to 7 days Update 09/21/22: No changes at this time   Last 3 [place] Suicide Severity Risk Score: Flowsheet Row Admission (Current) from 09/15/2022 in [facility]INE Most recent reading at 09/15/2022 10:00 PM ED from 09/15/2022 in [facility] Emergency Department at [facility] Most recent reading at 09/15/2022  5:26 PM Admission (Discharged) from 07/06/2022 in [facility] INPATIENT BEHAVIORAL MEDICINE Most recent reading at 07/06/2022  3:14  PM  C-SSRS RISK CATEGORY Moderate Risk High Risk No Risk       Last PHQ 2/9 Scores:     No data to display          Scribe for Treatment Team: [name], Theresia Majors 09/21/2022 10:22 AM

## 2022-09-21 NOTE — Progress Notes (Signed)
[facility] MD Progress Note  09/21/2022 11:02 AM [name]  MRN:  [id number] Subjective: [name] is seen on rounds.  He says he did not sleep very well last night because of another patient making noise.  He also complains of having a reaction from the trazodone.  I told him I will switch to Seroquel and due to his depression.  Add a low-dose of Risperdal.  He is able to contract for safety in the hospital.  He is been in good controls on the unit.  He is pleasant and cooperative.  Nurses report no issues. 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 echo 5/17: EF 60-65%, no RWMA, LV dias fxn nl, LA mildly dilated, RV sys fxn nl, PASP nl, moderate sized circumferential pericardial effusion was identified, 2.12 cm around the LV free wall, <1 cm around the RV free wall. Features were not c/w tamponade physiology   PTSD (post-traumatic stress disorder)    Witnessed brother's suicide.   Restless leg syndrome    Seizures (HCC)    Syncope     Past Surgical History:  Procedure Laterality Date   CYSTOSCOPY WITH URETEROSCOPY AND STENT PLACEMENT     ESOPHAGOGASTRODUODENOSCOPY N/A 01/11/2016   Procedure: ESOPHAGOGASTRODUODENOSCOPY (EGD);  Surgeon: Charlott Rakes, MD;  Location: [facility] ENDOSCOPY;  Service: Endoscopy;  Laterality: N/A;    ESOPHAGOGASTRODUODENOSCOPY N/A 04/09/2020   Procedure: ESOPHAGOGASTRODUODENOSCOPY (EGD);  Surgeon: [name], MD;  Location: [facility] ENDOSCOPY;  Service: Gastroenterology;  Laterality: N/A;   INCISION AND DRAINAGE ABSCESS Left 01/02/2018   Procedure: INCISION AND DRAINAGE LEFT KNEE;  Surgeon: Deeann Saint, MD;  Location: [facility] ORS;  Service: Orthopedics;  Laterality: Left;   JOINT REPLACEMENT Right    TKR   KNEE ARTHROSCOPY Right 06/25/2014   Procedure: ARTHROSCOPY KNEE;  Surgeon: Deeann Saint, MD;  Location: [facility] ORS;  Service: Orthopedics;  Laterality: Right;  partial arthroscopic medial menisectomy   LAPAROSCOPIC APPENDECTOMY N/A 06/02/2021   Procedure: APPENDECTOMY LAPAROSCOPIC;  Surgeon: Campbell Lerner, MD;  Location: [facility] ORS;  Service: General;  Laterality: N/A;   TOTAL KNEE ARTHROPLASTY Right 04/22/2015   Procedure: TOTAL KNEE ARTHROPLASTY;  Surgeon: Deeann Saint, MD;  Location: [facility] ORS;  Service: Orthopedics;  Laterality: Right;   TOTAL KNEE ARTHROPLASTY Left 10/30/2017   Procedure: TOTAL KNEE ARTHROPLASTY;  Surgeon: Deeann Saint, MD;  Location: [facility] ORS;  Service: Orthopedics;  Laterality: Left;   TOTAL KNEE REVISION Left 01/02/2018   Procedure: poly exchange of tibia and patella left knee;  Surgeon: Deeann Saint, MD;  Location: [facility] ORS;  Service: Orthopedics;  Laterality: Left;   UMBILICAL HERNIA REPAIR  06/02/2021   Procedure: HERNIA REPAIR UMBILICAL ADULT;  Surgeon: Campbell Lerner, MD;  Location: [facility] ORS;  Service: General;;   Family History:  Family History  Problem  Relation Age of Onset   CVA Mother        deceased at age 6   Depression Brother        Died by suicide at age 80   Family Psychiatric  History: Unremarkable Social History:  Social History   Substance and Sexual Activity  Alcohol Use Not Currently   Comment: rare     Social History   Substance and Sexual Activity  Drug Use Yes   Types: Marijuana, Cocaine    Social History   Socioeconomic  History   Marital status: Single    Spouse name: Not on file   Number of children: Not on file   Years of education: Not on file   Highest education level: Not on file  Occupational History   Not on file  Tobacco Use   Smoking status: Former    Current packs/day: 0.00    Average packs/day: 0.8 packs/day for 20.0 years (15.0 ttl pk-yrs)    Types: Cigarettes    Start date: 05/16/1964    Quit date: 05/16/1984    Years since quitting: 38.3   Smokeless tobacco: Never  Vaping Use   Vaping status: Never Used  Substance and Sexual Activity   Alcohol use: Not Currently    Comment: rare   Drug use: Yes    Types: Marijuana, Cocaine   Sexual activity: Not on file  Other Topics Concern   Not on file  Social History Narrative   ** Merged History Encounter **       Social Determinants of Health   Financial Resource Strain: Not on file  Food Insecurity: Food Insecurity Present (09/15/2022)   Hunger Vital Sign    Worried About Running Out of Food in the Last Year: Sometimes true    Ran Out of Food in the Last Year: Sometimes true  Transportation Needs: Unmet Transportation Needs (09/15/2022)   PRAPARE - Administrator, Civil Service (Medical): Yes    Lack of Transportation (Non-Medical): Yes  Physical Activity: Not on file  Stress: Not on file  Social Connections: Not on file   Additional Social History:                         Sleep: Poor  Appetite:  Fair  Current Medications: Current Facility-Administered Medications  Medication Dose Route Frequency Provider Last Rate Last Admin   acetaminophen (TYLENOL) tablet 650 mg  650 mg Oral Q6H PRN Onuoha, Chinwendu V, NP   650 mg at 09/21/22 0904   alum & mag hydroxide-simeth (MAALOX/MYLANTA) 200-200-20 MG/5ML suspension 30 mL  30 mL Oral Q4H PRN Onuoha, Chinwendu V, NP       baclofen (LIORESAL) tablet 5 mg  5 mg Oral BID Lewanda Rife, MD   5 mg at 09/21/22 0908   busPIRone (BUSPAR) tablet 5 mg  5 mg Oral BID  Lewanda Rife, MD   5 mg at 09/21/22 0904   divalproex (DEPAKOTE) DR tablet 500 mg  500 mg Oral BID Lewanda Rife, MD   500 mg at 09/21/22 0904   haloperidol (HALDOL) tablet 5 mg  5 mg Oral TID PRN Onuoha, Chinwendu V, NP       Or   haloperidol lactate (HALDOL) injection 5 mg  5 mg Intramuscular TID PRN Onuoha, Chinwendu V, NP       hydrOXYzine (ATARAX) tablet 25 mg  25 mg Oral TID PRN Onuoha, Chinwendu V, NP   25 mg at 09/21/22 [phone]  magnesium hydroxide (MILK OF MAGNESIA) suspension 30 mL  30 mL Oral Daily PRN Onuoha, Chinwendu V, NP       metoprolol succinate (TOPROL-XL) 24 hr tablet 50 mg  50 mg Oral Daily Lewanda Rife, MD   50 mg at 09/21/22 0904   mirtazapine (REMERON) tablet 15 mg  15 mg Oral QHS Lewanda Rife, MD   15 mg at 09/20/22 2141   QUEtiapine (SEROQUEL) tablet 100 mg  100 mg Oral QHS Sarina Ill, DO       risperiDONE (RISPERDAL) tablet 0.5 mg  0.5 mg Oral [id number] Khamani Daniely Edward, DO       traZODone (DESYREL) tablet 150 mg  150 mg Oral QHS Sarina Ill, DO   150 mg at 09/20/22 2140    Lab Results: No results found for this or any previous visit (from the past 48 hour(s)).  Blood Alcohol level:  Lab Results  Component Value Date   ETH <10 09/15/2022   ETH <10 07/05/2022    Metabolic Disorder Labs: Lab Results  Component Value Date   HGBA1C 5.5 04/16/2022   MPG 111 04/16/2022   MPG 105.41 01/18/2021   No results found for: "PROLACTIN" Lab Results  Component Value Date   CHOL 184 04/16/2022   TRIG 133 04/16/2022   HDL 32 (L) 04/16/2022   CHOLHDL 5.8 04/16/2022   VLDL 27 04/16/2022   LDLCALC 125 (H) 04/16/2022   LDLCALC 128 (H) 01/18/2021    Physical Findings: AIMS:  , ,  ,  ,    CIWA:    COWS:     Musculoskeletal: Strength & Muscle Tone: within normal limits Gait & Station: normal Patient leans: N/A  Psychiatric Specialty Exam:  Presentation  General Appearance:  Appropriate for Environment  Eye  Contact: Fair  Speech: Clear and Coherent  Speech Volume: Normal  Handedness: Right   Mood and Affect  Mood: Anxious; Depressed  Affect: Congruent; Constricted   Thought Process  Thought Processes: Coherent; Goal Directed  Descriptions of Associations:Intact  Orientation:Full (Time, Place and Person)  Thought Content:Abstract Reasoning; Logical; Rumination  History of Schizophrenia/Schizoaffective disorder:No  Duration of Psychotic Symptoms:Greater than six months  Hallucinations:No data recorded Ideas of Reference:None  Suicidal Thoughts:No data recorded Homicidal Thoughts:No data recorded  Sensorium  Memory: Immediate Fair; Recent Fair; Remote Fair  Judgment: Impaired  Insight: Shallow   Executive Functions  Concentration: Fair  Attention Span: Fair  Recall: Fair  Fund of Knowledge: Good  Language: Fair   Psychomotor Activity  Psychomotor Activity:No data recorded  Assets  Assets: Communication Skills; Financial Resources/Insurance   Sleep  Sleep:No data recorded    Blood pressure (!) 124/90, pulse 79, temperature 98 F (36.7 C), resp. rate 16, height 5\' 9"  (1.753 m), weight 79.8 kg, SpO2 97%. Body mass index is 25.99 kg/m.   Treatment Plan Summary: Daily contact with patient to assess and evaluate symptoms and progress in treatment, Medication management, and Plan start Risperdal 0.5 mg twice a day and Seroquel 100 mg at bedtime.  Discontinue trazodone  Sarina Ill, DO 09/21/2022, 11:02 AM

## 2022-09-22 DIAGNOSIS — F3130 Bipolar disorder, current episode depressed, mild or moderate severity, unspecified: Secondary | ICD-10-CM | POA: Diagnosis not present

## 2022-09-22 MED ORDER — ROPINIROLE HCL 1 MG PO TABS
1.0000 mg | ORAL_TABLET | Freq: Every day | ORAL | Status: DC
  Administered 2022-09-22 – 2022-10-06 (×15): 1 mg via ORAL
  Filled 2022-09-22 (×15): qty 1

## 2022-09-22 MED ORDER — QUETIAPINE FUMARATE 200 MG PO TABS
200.0000 mg | ORAL_TABLET | Freq: Every day | ORAL | Status: DC
  Administered 2022-09-22 – 2022-09-29 (×8): 200 mg via ORAL
  Filled 2022-09-22 (×8): qty 1

## 2022-09-22 NOTE — Group Note (Signed)
Date:  09/22/2022 Time:  9:19 PM  Group Topic/Focus:  Identifying Needs:   The focus of this group is to help patients identify their personal needs that have been historically problematic and identify healthy behaviors to address their needs.    Participation Level:  Did Not Attend  Participation Quality:   Did Not Attend  Affect:   Did Not Attend  Cognitive:   Did Not Attend  Insight: None  Engagement in Group:  None  Modes of Intervention:   Did Not Attend  Additional Comments:    Garry Heater 09/22/2022, 9:19 PM

## 2022-09-22 NOTE — Group Note (Signed)
Recreation Therapy Group Note   Group Topic:Leisure Education  Group Date: 09/22/2022 Start Time: 1400 End Time: 1450 Facilitators: Rosina Lowenstein, LRT, CTRS Location: Courtyard  Group Description: Leisure. Patients were given the opportunity to play ring toss, play corn hole, or listen to music while sitting in the courtyard getting fresh air and sunlight. Pt identified and conversated about things they enjoy doing in their free time and how they can continue to do that outside of the hospital.   Goal Area(s) Addressed: Patient will learn the definition of "leisure". Patient will practice making a positive decision. Patient will have the opportunity to try a new leisure activity. Patient will communicate with peers and LRT.   Affect/Mood: N/A   Participation Level: Did not attend    Clinical Observations/Individualized Feedback: Vaishnav did not attend.   Plan: Continue to engage patient in RT group sessions 2-3x/week.   Rosina Lowenstein, LRT, CTRS 09/22/2022 3:05 PM

## 2022-09-22 NOTE — Progress Notes (Signed)
   09/21/22 2300  Psych Admission Type (Psych Patients Only)  Admission Status Voluntary  Psychosocial Assessment  Patient Complaints Anxiety;Depression  Eye Contact Fair  Facial Expression Flat  Affect Blunted  Speech Logical/coherent  Interaction Assertive  Motor Activity Slow  Appearance/Hygiene In scrubs  Behavior Characteristics Cooperative  Mood Depressed;Anxious  Thought Process  Coherency WDL  Content WDL  Delusions None reported or observed  Hallucination None reported or observed  Judgment Impaired  Confusion None  Danger to Self  Current suicidal ideation? Denies  Self-Injurious Behavior No self-injurious ideation or behavior indicators observed or expressed   Description of Agreement Verbal  Danger to Others  Danger to Others None reported or observed

## 2022-09-22 NOTE — Progress Notes (Signed)
   09/22/22 0900  Psych Admission Type (Psych Patients Only)  Admission Status Voluntary  Psychosocial Assessment  Patient Complaints Isolation;Sadness  Eye Contact Fair  Facial Expression Flat  Affect Flat;Sad  Speech Logical/coherent  Interaction Assertive  Motor Activity Slow  Appearance/Hygiene In scrubs  Behavior Characteristics Cooperative  Mood Depressed;Anxious  Thought Process  Coherency WDL  Content WDL  Delusions None reported or observed  Hallucination None reported or observed  Judgment Impaired  Confusion None  Danger to Self  Current suicidal ideation? Denies  Self-Injurious Behavior No self-injurious ideation or behavior indicators observed or expressed   Description of Agreement Verbal  Danger to Others  Danger to Others None reported or observed
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
## 2022-09-22 NOTE — Plan of Care (Signed)

## 2022-09-23 DIAGNOSIS — F3130 Bipolar disorder, current episode depressed, mild or moderate severity, unspecified: Secondary | ICD-10-CM | POA: Diagnosis not present

## 2022-09-23 NOTE — Progress Notes (Signed)
   09/23/22 2200  Psych Admission Type (Psych Patients Only)  Admission Status Voluntary  Psychosocial Assessment  Patient Complaints Depression  Eye Contact Fair  Facial Expression Sad  Affect Depressed  Speech Logical/coherent  Interaction Assertive  Motor Activity Slow  Appearance/Hygiene In scrubs  Behavior Characteristics Cooperative  Mood Depressed  Thought Process  Coherency WDL  Content WDL  Delusions None reported or observed  Perception WDL  Hallucination None reported or observed  Judgment Impaired  Confusion None  Danger to Self  Current suicidal ideation? Passive  Agreement Not to Harm Self Yes  Description of Agreement Verbal  Danger to Others  Danger to Others None reported or observed

## 2022-09-23 NOTE — Progress Notes (Signed)
[facility] MD Progress Note  09/23/2022 2:23 PM [name]  MRN:  [id number] Subjective: [name] is seen on rounds.  He has been very pleasant and cooperative.  He remains depressed and isolative to his room.  He states that he slept better last night since I went up on his medication.  I talked to him about Wellbutrin and he is willing to try it.  Will restart tomorrow morning. Principal Problem: Bipolar I disorder, most recent episode depressed (HCC) Diagnosis: Principal Problem:   Bipolar I disorder, most recent episode depressed (HCC) Active Problems:   PTSD (post-traumatic stress disorder)  Total Time spent with patient: 15 minutes  Past Psychiatric History: Bipolar depression  Past Medical History:  Past Medical History:  Diagnosis Date   Anxiety    Arthritis    knees and hands   Bipolar 1 disorder, depressed (HCC)    Bipolar disorder (HCC)    Depression    GERD (gastroesophageal reflux disease)    Hepatitis    HEP "C"   History of kidney stones    Hypertension    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 echo 5/17: EF 60-65%, no RWMA, LV dias fxn nl, LA mildly dilated, RV sys fxn nl, PASP nl, moderate sized circumferential pericardial effusion was identified, 2.12 cm around the LV free wall, <1 cm around the RV free wall. Features were not c/w tamponade physiology   PTSD (post-traumatic stress disorder)    Witnessed brother's suicide.   Restless leg syndrome    Seizures (HCC)    Syncope     Past Surgical History:  Procedure Laterality Date   CYSTOSCOPY WITH URETEROSCOPY AND STENT PLACEMENT     ESOPHAGOGASTRODUODENOSCOPY N/A 01/11/2016   Procedure: ESOPHAGOGASTRODUODENOSCOPY (EGD);  Surgeon: Charlott Rakes, MD;  Location: [facility] ENDOSCOPY;  Service: Endoscopy;  Laterality: N/A;   ESOPHAGOGASTRODUODENOSCOPY N/A 04/09/2020   Procedure: ESOPHAGOGASTRODUODENOSCOPY (EGD);  Surgeon: [name], MD;  Location: [facility]  ENDOSCOPY;  Service: Gastroenterology;  Laterality: N/A;   INCISION AND DRAINAGE ABSCESS Left 01/02/2018   Procedure: INCISION AND DRAINAGE LEFT KNEE;  Surgeon: Deeann Saint, MD;  Location: [facility] ORS;  Service: Orthopedics;  Laterality: Left;   JOINT REPLACEMENT Right    TKR   KNEE ARTHROSCOPY Right 06/25/2014   Procedure: ARTHROSCOPY KNEE;  Surgeon: Deeann Saint, MD;  Location: [facility] ORS;  Service: Orthopedics;  Laterality: Right;  partial arthroscopic medial menisectomy   LAPAROSCOPIC APPENDECTOMY N/A 06/02/2021   Procedure: APPENDECTOMY LAPAROSCOPIC;  Surgeon: Campbell Lerner, MD;  Location: [facility] ORS;  Service: General;  Laterality: N/A;   TOTAL KNEE ARTHROPLASTY Right 04/22/2015   Procedure: TOTAL KNEE ARTHROPLASTY;  Surgeon: Deeann Saint, MD;  Location: [facility] ORS;  Service: Orthopedics;  Laterality: Right;   TOTAL KNEE ARTHROPLASTY Left 10/30/2017   Procedure: TOTAL KNEE ARTHROPLASTY;  Surgeon: Deeann Saint, MD;  Location: [facility] ORS;  Service: Orthopedics;  Laterality: Left;   TOTAL KNEE REVISION Left 01/02/2018   Procedure: poly exchange of tibia and patella left knee;  Surgeon: Deeann Saint, MD;  Location: [facility] ORS;  Service: Orthopedics;  Laterality: Left;   UMBILICAL HERNIA REPAIR  06/02/2021   Procedure: HERNIA REPAIR UMBILICAL ADULT;  Surgeon: Campbell Lerner, MD;  Location: [facility] ORS;  Service: General;;   Family History:  Family History  Problem Relation Age of Onset   CVA Mother        deceased at age 23   Depression Brother  Died by suicide at age 1   Family Psychiatric  History: Unremarkable Social History:  Social History   Substance and Sexual Activity  Alcohol Use Not Currently   Comment: rare     Social History   Substance and Sexual Activity  Drug Use Yes   Types: Marijuana, Cocaine    Social History   Socioeconomic History   Marital status: Single    Spouse name: Not on file   Number of children: Not on file   Years of education: Not on file    Highest education level: Not on file  Occupational History   Not on file  Tobacco Use   Smoking status: Former    Current packs/day: 0.00    Average packs/day: 0.8 packs/day for 20.0 years (15.0 ttl pk-yrs)    Types: Cigarettes    Start date: 05/16/1964    Quit date: 05/16/1984    Years since quitting: 38.3   Smokeless tobacco: Never  Vaping Use   Vaping status: Never Used  Substance and Sexual Activity   Alcohol use: Not Currently    Comment: rare   Drug use: Yes    Types: Marijuana, Cocaine   Sexual activity: Not on file  Other Topics Concern   Not on file  Social History Narrative   ** Merged History Encounter **       Social Determinants of Health   Financial Resource Strain: Not on file  Food Insecurity: Food Insecurity Present (09/15/2022)   Hunger Vital Sign    Worried About Running Out of Food in the Last Year: Sometimes true    Ran Out of Food in the Last Year: Sometimes true  Transportation Needs: Unmet Transportation Needs (09/15/2022)   PRAPARE - Administrator, Civil Service (Medical): Yes    Lack of Transportation (Non-Medical): Yes  Physical Activity: Not on file  Stress: Not on file  Social Connections: Not on file   Additional Social History:                         Sleep: Good  Appetite:  Good  Current Medications: Current Facility-Administered Medications  Medication Dose Route Frequency Provider Last Rate Last Admin   acetaminophen (TYLENOL) tablet 650 mg  650 mg Oral Q6H PRN Onuoha, Chinwendu V, NP   650 mg at 09/23/22 1152   alum & mag hydroxide-simeth (MAALOX/MYLANTA) 200-200-20 MG/5ML suspension 30 mL  30 mL Oral Q4H PRN Onuoha, Chinwendu V, NP       baclofen (LIORESAL) tablet 5 mg  5 mg Oral BID Lewanda Rife, MD   5 mg at 09/23/22 0939   busPIRone (BUSPAR) tablet 5 mg  5 mg Oral BID Lewanda Rife, MD   5 mg at 09/23/22 0941   divalproex (DEPAKOTE) DR tablet 500 mg  500 mg Oral BID Lewanda Rife, MD   500 mg  at 09/23/22 0939   haloperidol (HALDOL) tablet 5 mg  5 mg Oral TID PRN Onuoha, Chinwendu V, NP       Or   haloperidol lactate (HALDOL) injection 5 mg  5 mg Intramuscular TID PRN Onuoha, Chinwendu V, NP       hydrOXYzine (ATARAX) tablet 25 mg  25 mg Oral TID PRN Onuoha, Chinwendu V, NP   25 mg at 09/23/22 1152   magnesium hydroxide (MILK OF MAGNESIA) suspension 30 mL  30 mL Oral Daily PRN Onuoha, Chinwendu V, NP       metoprolol succinate (TOPROL-XL) 24  hr tablet 50 mg  50 mg Oral Daily Lewanda Rife, MD   50 mg at 09/23/22 0940   mirtazapine (REMERON) tablet 15 mg  15 mg Oral QHS Lewanda Rife, MD   15 mg at 09/22/22 2208   QUEtiapine (SEROQUEL) tablet 200 mg  200 mg Oral QHS Sarina Ill, DO   200 mg at 09/22/22 2134   risperiDONE (RISPERDAL) tablet 0.5 mg  0.5 mg Oral [id number] Sarina Ill, DO   0.5 mg at 09/23/22 0751   rOPINIRole (REQUIP) tablet 1 mg  1 mg Oral QHS Sarina Ill, DO   1 mg at 09/22/22 2133    Lab Results: No results found for this or any previous visit (from the past 48 hour(s)).  Blood Alcohol level:  Lab Results  Component Value Date   ETH <10 09/15/2022   ETH <10 07/05/2022    Metabolic Disorder Labs: Lab Results  Component Value Date   HGBA1C 5.5 04/16/2022   MPG 111 04/16/2022   MPG 105.41 01/18/2021   No results found for: "PROLACTIN" Lab Results  Component Value Date   CHOL 184 04/16/2022   TRIG 133 04/16/2022   HDL 32 (L) 04/16/2022   CHOLHDL 5.8 04/16/2022   VLDL 27 04/16/2022   LDLCALC 125 (H) 04/16/2022   LDLCALC 128 (H) 01/18/2021    Physical Findings: AIMS:  , ,  ,  ,    CIWA:    COWS:     Musculoskeletal: Strength & Muscle Tone: within normal limits Gait & Station: normal Patient leans: N/A  Psychiatric Specialty Exam:  Presentation  General Appearance:  Appropriate for Environment  Eye Contact: Fair  Speech: Clear and Coherent  Speech  Volume: Normal  Handedness: Right   Mood and Affect  Mood: Anxious; Depressed  Affect: Congruent; Constricted   Thought Process  Thought Processes: Coherent; Goal Directed  Descriptions of Associations:Intact  Orientation:Full (Time, Place and Person)  Thought Content:Abstract Reasoning; Logical; Rumination  History of Schizophrenia/Schizoaffective disorder:No  Duration of Psychotic Symptoms:Greater than six months  Hallucinations:No data recorded Ideas of Reference:None  Suicidal Thoughts:No data recorded Homicidal Thoughts:No data recorded  Sensorium  Memory: Immediate Fair; Recent Fair; Remote Fair  Judgment: Impaired  Insight: Shallow   Executive Functions  Concentration: Fair  Attention Span: Fair  Recall: Fair  Fund of Knowledge: Good  Language: Fair   Psychomotor Activity  Psychomotor Activity:No data recorded  Assets  Assets: Communication Skills; Financial Resources/Insurance   Sleep  Sleep:No data recorded    Blood pressure 132/87, pulse 91, temperature 98.4 F (36.9 C), resp. rate 18, height 5\' 9"  (1.753 m), weight 79.8 kg, SpO2 98%. Body mass index is 25.99 kg/m.   Treatment Plan Summary: Daily contact with patient to assess and evaluate symptoms and progress in treatment, Medication management, and Plan continue current medications.  Sarina Ill, DO 09/23/2022, 2:23 PM

## 2022-09-23 NOTE — Plan of Care (Signed)

## 2022-09-23 NOTE — Group Note (Signed)
Recreation Therapy Group Note   Group Topic:Coping Skills  Group Date: 09/23/2022 Start Time: 1330 End Time: 1415 Facilitators: Rosina Lowenstein, LRT, CTRS Location: Courtyard  Group Description: Music Reminisce. LRT encouraged patients to think of their favorite song(s) that reminded them of a positive memory or time in their life. LRT encouraged patient to talk about that memory aloud to the group. LRT played the song through a speaker for all to hear. LRT and patients discussed how thinking of a positive memory or time in their life can be used as a coping skill in everyday life post discharge.   Goal Area(s) Addressed: Patient will increase verbal communication by conversing with peers. Patient will contribute to group discussion with minimal prompting. Patient will reminisce a positive memory or moment in their life.    Affect/Mood: N/A   Participation Level: Did not attend    Clinical Observations/Individualized Feedback: [name] did not attend group.  Plan: Continue to engage patient in RT group sessions 2-3x/week.   Rosina Lowenstein, LRT, CTRS 09/23/2022 2:43 PM

## 2022-09-23 NOTE — Progress Notes (Signed)
At 1152a pt was medicated with tylenol and atarax for anxiety which for him causes body aches. Aches at level 7 and relieved with the tylenol.

## 2022-09-23 NOTE — Progress Notes (Signed)
D- Patient alert and oriented. Sad affect. Depressed mood. Passive SI w/o plan. Denies HI, AVH, and pain.  A- Scheduled medications administered to patient, per MD orders. Support and encouragement provided.  Routine safety checks conducted every 15 minutes.  Patient informed to notify staff with problems or concerns. R- No adverse drug reactions noted. Patient contracts for safety at this time. Patient compliant with medications and treatment plan. Patient receptive to care. Isolative. Did not attend group.  Patient remains safe at this time.

## 2022-09-23 NOTE — Group Note (Signed)
Date:  09/23/2022 Time:  9:05 PM  Group Topic/Focus:  Healthy Communication:   The focus of this group is to discuss communication, barriers to communication, as well as healthy ways to communicate with others.    Participation Level:  Active  Participation Quality:  Appropriate  Affect:  Appropriate  Cognitive:  Appropriate  Insight: Good  Engagement in Group:  Engaged  Modes of Intervention:  Education  Additional Comments:    Garry Heater 09/23/2022, 9:05 PM

## 2022-09-23 NOTE — Group Note (Signed)
LCSW Group Therapy Note  Group Date: 09/23/2022 Start Time: 1400 End Time: 1445   Type of Therapy and Topic:  Group Therapy - Healthy vs Unhealthy Coping Skills  Participation Level:  Did Not Attend   Description of Group The focus of this group was to determine what unhealthy coping techniques typically are used by group members and what healthy coping techniques would be helpful in coping with various problems. Patients were guided in becoming aware of the differences between healthy and unhealthy coping techniques. Patients were asked to identify 2-3 healthy coping skills they would like to learn to use more effectively.  Therapeutic Goals Patients learned that coping is what human beings do all day long to deal with various situations in their lives Patients defined and discussed healthy vs unhealthy coping techniques Patients identified their preferred coping techniques and identified whether these were healthy or unhealthy Patients determined 2-3 healthy coping skills they would like to become more familiar with and use more often. Patients provided support and ideas to each other   Summary of Patient Progress: X  Therapeutic Modalities Cognitive Behavioral Therapy Motivational Interviewing  [name], [place] 09/23/2022  3:16 PM

## 2022-09-24 DIAGNOSIS — F3130 Bipolar disorder, current episode depressed, mild or moderate severity, unspecified: Secondary | ICD-10-CM | POA: Diagnosis not present

## 2022-09-24 MED ORDER — BUPROPION HCL ER (XL) 150 MG PO TB24
150.0000 mg | ORAL_TABLET | Freq: Every day | ORAL | Status: DC
  Administered 2022-09-24 – 2022-09-26 (×3): 150 mg via ORAL
  Filled 2022-09-24 (×3): qty 1

## 2022-09-24 NOTE — Group Note (Signed)
Date:  09/24/2022 Time:  12:41 PM  Group Topic/Focus:  Outdoor recreation. Sales executive.    Participation Level:  Did Not Attend   Rosaura Carpenter 09/24/2022, 12:41 PM

## 2022-09-24 NOTE — Group Note (Signed)
[facility] LCSW Group Therapy Note   Group Date: 09/24/2022 Start Time: 1320 End Time: 1405   Type of Therapy/Topic:  Group Therapy:  Balance in Life  Participation Level:  Did Not Attend   Description of Group:    This group will address the concept of balance and how it feels and looks when one is unbalanced. Patients will be encouraged to process areas in their lives that are out of balance, and identify reasons for remaining unbalanced. Facilitators will guide patients utilizing problem- solving interventions to address and correct the stressor making their life unbalanced. Understanding and applying boundaries will be explored and addressed for obtaining  and maintaining a balanced life. Patients will be encouraged to explore ways to assertively make their unbalanced needs known to significant others in their lives, using other group members and facilitator for support and feedback.  Therapeutic Goals: Patient will identify two or more emotions or situations they have that consume much of in their lives. Patient will identify signs/triggers that life has become out of balance:  Patient will identify two ways to set boundaries in order to achieve balance in their lives:  Patient will demonstrate ability to communicate their needs through discussion and/or role plays  Summary of Patient Progress:    Pt. Did not attend group    Azucena Kuba, LCSWA

## 2022-09-24 NOTE — Progress Notes (Signed)
   09/24/22 0559  15 Minute Checks  Location Bedroom  Visual Appearance Calm  Behavior Sleeping  Sleep (Behavioral Health Patients Only)  Calculate sleep? (Click Yes once per 24 hr at 0600 safety check) Yes  Documented sleep last 24 hours 8.5

## 2022-09-24 NOTE — Progress Notes (Signed)
   09/24/22 2100  Psych Admission Type (Psych Patients Only)  Admission Status Voluntary  Psychosocial Assessment  Patient Complaints Anxiety  Eye Contact Fair  Facial Expression Anxious  Affect Anxious  Speech Logical/coherent  Interaction Assertive  Motor Activity Slow  Appearance/Hygiene Unremarkable  Behavior Characteristics Cooperative;Appropriate to situation  Mood Depressed;Anxious;Pleasant  Thought Process  Coherency WDL  Content WDL  Delusions None reported or observed  Perception Hallucinations  Hallucination Auditory  Judgment Impaired  Confusion None  Danger to Self  Current suicidal ideation? Passive  Agreement Not to Harm Self Yes  Description of Agreement verbal  Danger to Others  Danger to Others None reported or observed

## 2022-09-24 NOTE — Group Note (Signed)
Date:  09/24/2022 Time:  5:59 PM  Group Topic/Focus:  Goals Group:   The focus of this group is to help patients establish daily goals to achieve during treatment and discuss how the patient can incorporate goal setting into their daily lives to aide in recovery.    Participation Level:  Did Not Attend   [name] 09/24/2022, 5:59 PM

## 2022-09-24 NOTE — Progress Notes (Signed)
Patient calm and cooperative.  Anxious affect.  Endorses anxiety and AH. Passive SI. Contracts for safety on the unit.  Reports he slept better last night. Pain 8/10 in head/neck.  Denies HI and VH.    Compliant with scheduled medications.  15 min checks in place for safety.  Minimal presence in milieu. Minimal interaction with staff and peers.   PRN medication for pain and anxiety given x 1.

## 2022-09-24 NOTE — Progress Notes (Signed)
[facility] MD Progress Note  09/24/2022 12:24 PM [name]  MRN:  [id number] Subjective: [name] is seen on rounds.  He says that he slept well last night.  We are starting Wellbutrin this morning.  He is still flat and depressed.  Nurses report no problems. Principal Problem: Bipolar I disorder, most recent episode depressed (HCC) Diagnosis: Principal Problem:   Bipolar I disorder, most recent episode depressed (HCC) Active Problems:   PTSD (post-traumatic stress disorder)  Total Time spent with patient: 15 minutes  Past Psychiatric History: Long history of bipolar depression  Past Medical History:  Past Medical History:  Diagnosis Date   Anxiety    Arthritis    knees and hands   Bipolar 1 disorder, depressed (HCC)    Bipolar disorder (HCC)    Depression    GERD (gastroesophageal reflux disease)    Hepatitis    HEP "C"   History of kidney stones    Hypertension    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 echo 5/17: EF 60-65%, no RWMA, LV dias fxn nl, LA mildly dilated, RV sys fxn nl, PASP nl, moderate sized circumferential pericardial effusion was identified, 2.12 cm around the LV free wall, <1 cm around the RV free wall. Features were not c/w tamponade physiology   PTSD (post-traumatic stress disorder)    Witnessed brother's suicide.   Restless leg syndrome    Seizures (HCC)    Syncope     Past Surgical History:  Procedure Laterality Date   CYSTOSCOPY WITH URETEROSCOPY AND STENT PLACEMENT     ESOPHAGOGASTRODUODENOSCOPY N/A 01/11/2016   Procedure: ESOPHAGOGASTRODUODENOSCOPY (EGD);  Surgeon: Charlott Rakes, MD;  Location: [facility] ENDOSCOPY;  Service: Endoscopy;  Laterality: N/A;   ESOPHAGOGASTRODUODENOSCOPY N/A 04/09/2020   Procedure: ESOPHAGOGASTRODUODENOSCOPY (EGD);  Surgeon: [name], MD;  Location: [facility] ENDOSCOPY;  Service: Gastroenterology;  Laterality: N/A;   INCISION AND DRAINAGE ABSCESS Left 01/02/2018    Procedure: INCISION AND DRAINAGE LEFT KNEE;  Surgeon: Deeann Saint, MD;  Location: [facility] ORS;  Service: Orthopedics;  Laterality: Left;   JOINT REPLACEMENT Right    TKR   KNEE ARTHROSCOPY Right 06/25/2014   Procedure: ARTHROSCOPY KNEE;  Surgeon: Deeann Saint, MD;  Location: [facility] ORS;  Service: Orthopedics;  Laterality: Right;  partial arthroscopic medial menisectomy   LAPAROSCOPIC APPENDECTOMY N/A 06/02/2021   Procedure: APPENDECTOMY LAPAROSCOPIC;  Surgeon: Campbell Lerner, MD;  Location: [facility] ORS;  Service: General;  Laterality: N/A;   TOTAL KNEE ARTHROPLASTY Right 04/22/2015   Procedure: TOTAL KNEE ARTHROPLASTY;  Surgeon: Deeann Saint, MD;  Location: [facility] ORS;  Service: Orthopedics;  Laterality: Right;   TOTAL KNEE ARTHROPLASTY Left 10/30/2017   Procedure: TOTAL KNEE ARTHROPLASTY;  Surgeon: Deeann Saint, MD;  Location: [facility] ORS;  Service: Orthopedics;  Laterality: Left;   TOTAL KNEE REVISION Left 01/02/2018   Procedure: poly exchange of tibia and patella left knee;  Surgeon: Deeann Saint, MD;  Location: [facility] ORS;  Service: Orthopedics;  Laterality: Left;   UMBILICAL HERNIA REPAIR  06/02/2021   Procedure: HERNIA REPAIR UMBILICAL ADULT;  Surgeon: Campbell Lerner, MD;  Location: [facility] ORS;  Service: General;;   Family History:  Family History  Problem Relation Age of Onset   CVA Mother        deceased at age 79   Depression Brother        Died by suicide at age 73   Family Psychiatric  History: Unremarkable Social History:  Social History  Substance and Sexual Activity  Alcohol Use Not Currently   Comment: rare     Social History   Substance and Sexual Activity  Drug Use Yes   Types: Marijuana, Cocaine    Social History   Socioeconomic History   Marital status: Single    Spouse name: Not on file   Number of children: Not on file   Years of education: Not on file   Highest education level: Not on file  Occupational History   Not on file  Tobacco Use   Smoking  status: Former    Current packs/day: 0.00    Average packs/day: 0.8 packs/day for 20.0 years (15.0 ttl pk-yrs)    Types: Cigarettes    Start date: 05/16/1964    Quit date: 05/16/1984    Years since quitting: 38.3   Smokeless tobacco: Never  Vaping Use   Vaping status: Never Used  Substance and Sexual Activity   Alcohol use: Not Currently    Comment: rare   Drug use: Yes    Types: Marijuana, Cocaine   Sexual activity: Not on file  Other Topics Concern   Not on file  Social History Narrative   ** Merged History Encounter **       Social Determinants of Health   Financial Resource Strain: Not on file  Food Insecurity: Food Insecurity Present (09/15/2022)   Hunger Vital Sign    Worried About Running Out of Food in the Last Year: Sometimes true    Ran Out of Food in the Last Year: Sometimes true  Transportation Needs: Unmet Transportation Needs (09/15/2022)   PRAPARE - Administrator, Civil Service (Medical): Yes    Lack of Transportation (Non-Medical): Yes  Physical Activity: Not on file  Stress: Not on file  Social Connections: Not on file   Additional Social History:                         Sleep: Good  Appetite:  Good  Current Medications: Current Facility-Administered Medications  Medication Dose Route Frequency Provider Last Rate Last Admin   acetaminophen (TYLENOL) tablet 650 mg  650 mg Oral Q6H PRN Onuoha, Chinwendu V, NP   650 mg at 09/24/22 1153   alum & mag hydroxide-simeth (MAALOX/MYLANTA) 200-200-20 MG/5ML suspension 30 mL  30 mL Oral Q4H PRN Onuoha, Chinwendu V, NP       baclofen (LIORESAL) tablet 5 mg  5 mg Oral BID Lewanda Rife, MD   5 mg at 09/24/22 0838   buPROPion (WELLBUTRIN XL) 24 hr tablet 150 mg  150 mg Oral Daily Sarina Ill, DO   150 mg at 09/24/22 1152   busPIRone (BUSPAR) tablet 5 mg  5 mg Oral BID Lewanda Rife, MD   5 mg at 09/24/22 0837   divalproex (DEPAKOTE) DR tablet 500 mg  500 mg Oral BID Lewanda Rife, MD   500 mg at 09/24/22 6045   haloperidol (HALDOL) tablet 5 mg  5 mg Oral TID PRN Onuoha, Chinwendu V, NP       Or   haloperidol lactate (HALDOL) injection 5 mg  5 mg Intramuscular TID PRN Onuoha, Chinwendu V, NP       hydrOXYzine (ATARAX) tablet 25 mg  25 mg Oral TID PRN Onuoha, Chinwendu V, NP   25 mg at 09/24/22 1153   magnesium hydroxide (MILK OF MAGNESIA) suspension 30 mL  30 mL Oral Daily PRN Onuoha, Chinwendu V, NP  metoprolol succinate (TOPROL-XL) 24 hr tablet 50 mg  50 mg Oral Daily Lewanda Rife, MD   50 mg at 09/24/22 0838   mirtazapine (REMERON) tablet 15 mg  15 mg Oral QHS Lewanda Rife, MD   15 mg at 09/23/22 2112   QUEtiapine (SEROQUEL) tablet 200 mg  200 mg Oral QHS Sarina Ill, DO   200 mg at 09/23/22 2112   risperiDONE (RISPERDAL) tablet 0.5 mg  0.5 mg Oral [id number] Sarina Ill, DO   0.5 mg at 09/24/22 4540   rOPINIRole (REQUIP) tablet 1 mg  1 mg Oral QHS Sarina Ill, DO   1 mg at 09/23/22 2111    Lab Results: No results found for this or any previous visit (from the past 48 hour(s)).  Blood Alcohol level:  Lab Results  Component Value Date   ETH <10 09/15/2022   ETH <10 07/05/2022    Metabolic Disorder Labs: Lab Results  Component Value Date   HGBA1C 5.5 04/16/2022   MPG 111 04/16/2022   MPG 105.41 01/18/2021   No results found for: "PROLACTIN" Lab Results  Component Value Date   CHOL 184 04/16/2022   TRIG 133 04/16/2022   HDL 32 (L) 04/16/2022   CHOLHDL 5.8 04/16/2022   VLDL 27 04/16/2022   LDLCALC 125 (H) 04/16/2022   LDLCALC 128 (H) 01/18/2021    Physical Findings: AIMS:  , ,  ,  ,    CIWA:    COWS:     Musculoskeletal: Strength & Muscle Tone: 6 Gait & Station: normal Patient leans: N/A  Psychiatric Specialty Exam:  Presentation  General Appearance:  Appropriate for Environment  Eye Contact: Fair  Speech: Clear and Coherent  Speech  Volume: Normal  Handedness: Right   Mood and Affect  Mood: Anxious; Depressed  Affect: Congruent; Constricted   Thought Process  Thought Processes: Coherent; Goal Directed  Descriptions of Associations:Intact  Orientation:Full (Time, Place and Person)  Thought Content:Abstract Reasoning; Logical; Rumination  History of Schizophrenia/Schizoaffective disorder:No  Duration of Psychotic Symptoms:Greater than six months  Hallucinations:No data recorded Ideas of Reference:None  Suicidal Thoughts:No data recorded Homicidal Thoughts:No data recorded  Sensorium  Memory: Immediate Fair; Recent Fair; Remote Fair  Judgment: Impaired  Insight: Shallow   Executive Functions  Concentration: Fair  Attention Span: Fair  Recall: Fair  Fund of Knowledge: Good  Language: Fair   Psychomotor Activity  Psychomotor Activity:No data recorded  Assets  Assets: Communication Skills; Financial Resources/Insurance   Sleep  Sleep:No data recorded    Blood pressure (!) 126/99, pulse 85, temperature 98.6 F (37 C), resp. rate 18, height 5\' 9"  (1.753 m), weight 79.8 kg, SpO2 98%. Body mass index is 25.99 kg/m.   Treatment Plan Summary: Daily contact with patient to assess and evaluate symptoms and progress in treatment, Medication management, and Plan starting Wellbutrin this morning  Sarina Ill, DO 09/24/2022, 12:24 PM

## 2022-09-24 NOTE — Plan of Care (Signed)
?  Problem: Health Behavior/Discharge Planning: ?Goal: Ability to manage health-related needs will improve ?Outcome: Progressing ?  ?Problem: Nutrition: ?Goal: Adequate nutrition will be maintained ?Outcome: Progressing ?  ?Problem: Safety: ?Goal: Ability to remain free from injury will improve ?Outcome: Progressing ?  ?Problem: Skin Integrity: ?Goal: Risk for impaired skin integrity will decrease ?Outcome: Progressing ?  ?

## 2022-09-25 DIAGNOSIS — F3130 Bipolar disorder, current episode depressed, mild or moderate severity, unspecified: Secondary | ICD-10-CM | POA: Diagnosis not present

## 2022-09-25 NOTE — Group Note (Signed)
Date:  09/25/2022 Time:  12:10 AM  Group Topic/Focus:  Overcoming Stress:   The focus of this group is to define stress and help patients assess their triggers.    Participation Level:  Active  Participation Quality:  Appropriate  Affect:  Appropriate  Cognitive:  Appropriate  Insight: Appropriate  Engagement in Group:  Engaged  Modes of Intervention:  Exploration  Additional Comments:    Garry Heater 09/25/2022, 12:10 AM

## 2022-09-25 NOTE — Progress Notes (Signed)
Patient present in the dayroom for breakfast.  Anxious affect.  Endorses anxiety, AH and passive SI.  Contracts for safety on the unit. Denies pain at this time.   Compliant with scheduled medications.  15 min checks in place for safety.  Patient present in the milieu.  Appropriate interaction with peers and staff.   PRN medications given for pain (8/10 in head/neck) and anxiety x 1.

## 2022-09-25 NOTE — Group Note (Unsigned)
Date:  09/26/2022 Time:  12:27 AM  Group Topic/Focus:  Making Healthy Choices:   The focus of this group is to help patients identify negative/unhealthy choices they were using prior to admission and identify positive/healthier coping strategies to replace them upon discharge.    Participation Level:  Active  Participation Quality:  Appropriate  Affect:  Appropriate  Cognitive:  Appropriate  Insight: Improving  Engagement in Group:  Improving  Modes of Intervention:  Discussion  Additional Comments:    Maeola Harman 09/26/2022, 12:27 AM

## 2022-09-25 NOTE — Progress Notes (Signed)
[facility] MD Progress Note  09/25/2022 11:31 AM [name]  MRN:  [id number] Subjective: [name] is seen on rounds.  I started him on Wellbutrin yesterday.  He says that he is not sleeping very well but he blames it on staff opening up his door.  He has been compliant with medications.  He denies any side effects.  Still complains of being depressed.  Affect is flat.  Nurses report no issues.  He has been compliant.  I think we will go up on his Wellbutrin in a couple days. Principal Problem: Bipolar I disorder, most recent episode depressed (HCC) Diagnosis: Principal Problem:   Bipolar I disorder, most recent episode depressed (HCC) Active Problems:   PTSD (post-traumatic stress disorder)  Total Time spent with patient: 15 minutes  Past Psychiatric History: Bipolar depression, PTSD  Past Medical History:  Past Medical History:  Diagnosis Date   Anxiety    Arthritis    knees and hands   Bipolar 1 disorder, depressed (HCC)    Bipolar disorder (HCC)    Depression    GERD (gastroesophageal reflux disease)    Hepatitis    HEP "C"   History of kidney stones    Hypertension    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 echo 5/17: EF 60-65%, no RWMA, LV dias fxn nl, LA mildly dilated, RV sys fxn nl, PASP nl, moderate sized circumferential pericardial effusion was identified, 2.12 cm around the LV free wall, <1 cm around the RV free wall. Features were not c/w tamponade physiology   PTSD (post-traumatic stress disorder)    Witnessed brother's suicide.   Restless leg syndrome    Seizures (HCC)    Syncope     Past Surgical History:  Procedure Laterality Date   CYSTOSCOPY WITH URETEROSCOPY AND STENT PLACEMENT     ESOPHAGOGASTRODUODENOSCOPY N/A 01/11/2016   Procedure: ESOPHAGOGASTRODUODENOSCOPY (EGD);  Surgeon: Charlott Rakes, MD;  Location: [facility] ENDOSCOPY;  Service: Endoscopy;  Laterality: N/A;   ESOPHAGOGASTRODUODENOSCOPY N/A  04/09/2020   Procedure: ESOPHAGOGASTRODUODENOSCOPY (EGD);  Surgeon: [name], MD;  Location: [facility] ENDOSCOPY;  Service: Gastroenterology;  Laterality: N/A;   INCISION AND DRAINAGE ABSCESS Left 01/02/2018   Procedure: INCISION AND DRAINAGE LEFT KNEE;  Surgeon: Deeann Saint, MD;  Location: [facility] ORS;  Service: Orthopedics;  Laterality: Left;   JOINT REPLACEMENT Right    TKR   KNEE ARTHROSCOPY Right 06/25/2014   Procedure: ARTHROSCOPY KNEE;  Surgeon: Deeann Saint, MD;  Location: [facility] ORS;  Service: Orthopedics;  Laterality: Right;  partial arthroscopic medial menisectomy   LAPAROSCOPIC APPENDECTOMY N/A 06/02/2021   Procedure: APPENDECTOMY LAPAROSCOPIC;  Surgeon: Campbell Lerner, MD;  Location: [facility] ORS;  Service: General;  Laterality: N/A;   TOTAL KNEE ARTHROPLASTY Right 04/22/2015   Procedure: TOTAL KNEE ARTHROPLASTY;  Surgeon: Deeann Saint, MD;  Location: [facility] ORS;  Service: Orthopedics;  Laterality: Right;   TOTAL KNEE ARTHROPLASTY Left 10/30/2017   Procedure: TOTAL KNEE ARTHROPLASTY;  Surgeon: Deeann Saint, MD;  Location: [facility] ORS;  Service: Orthopedics;  Laterality: Left;   TOTAL KNEE REVISION Left 01/02/2018   Procedure: poly exchange of tibia and patella left knee;  Surgeon: Deeann Saint, MD;  Location: [facility] ORS;  Service: Orthopedics;  Laterality: Left;   UMBILICAL HERNIA REPAIR  06/02/2021   Procedure: HERNIA REPAIR UMBILICAL ADULT;  Surgeon: Campbell Lerner, MD;  Location: [facility] ORS;  Service: General;;   Family History:  Family History  Problem Relation Age of Onset  CVA Mother        deceased at age 9   Depression Brother        Died by suicide at age 67   Family Psychiatric  History: Unremarkable Social History:  Social History   Substance and Sexual Activity  Alcohol Use Not Currently   Comment: rare     Social History   Substance and Sexual Activity  Drug Use Yes   Types: Marijuana, Cocaine    Social History   Socioeconomic History   Marital status:  Single    Spouse name: Not on file   Number of children: Not on file   Years of education: Not on file   Highest education level: Not on file  Occupational History   Not on file  Tobacco Use   Smoking status: Former    Current packs/day: 0.00    Average packs/day: 0.8 packs/day for 20.0 years (15.0 ttl pk-yrs)    Types: Cigarettes    Start date: 05/16/1964    Quit date: 05/16/1984    Years since quitting: 38.3   Smokeless tobacco: Never  Vaping Use   Vaping status: Never Used  Substance and Sexual Activity   Alcohol use: Not Currently    Comment: rare   Drug use: Yes    Types: Marijuana, Cocaine   Sexual activity: Not on file  Other Topics Concern   Not on file  Social History Narrative   ** Merged History Encounter **       Social Determinants of Health   Financial Resource Strain: Not on file  Food Insecurity: Food Insecurity Present (09/15/2022)   Hunger Vital Sign    Worried About Running Out of Food in the Last Year: Sometimes true    Ran Out of Food in the Last Year: Sometimes true  Transportation Needs: Unmet Transportation Needs (09/15/2022)   PRAPARE - Administrator, Civil Service (Medical): Yes    Lack of Transportation (Non-Medical): Yes  Physical Activity: Not on file  Stress: Not on file  Social Connections: Not on file   Additional Social History:                         Sleep: Poor  Appetite:  Fair  Current Medications: Current Facility-Administered Medications  Medication Dose Route Frequency Provider Last Rate Last Admin   acetaminophen (TYLENOL) tablet 650 mg  650 mg Oral Q6H PRN Onuoha, Chinwendu V, NP   650 mg at 09/24/22 2001   alum & mag hydroxide-simeth (MAALOX/MYLANTA) 200-200-20 MG/5ML suspension 30 mL  30 mL Oral Q4H PRN Onuoha, Chinwendu V, NP       baclofen (LIORESAL) tablet 5 mg  5 mg Oral BID Lewanda Rife, MD   5 mg at 09/25/22 0756   buPROPion (WELLBUTRIN XL) 24 hr tablet 150 mg  150 mg Oral Daily Sarina Ill, DO   150 mg at 09/25/22 0756   busPIRone (BUSPAR) tablet 5 mg  5 mg Oral BID Lewanda Rife, MD   5 mg at 09/25/22 0757   divalproex (DEPAKOTE) DR tablet 500 mg  500 mg Oral BID Lewanda Rife, MD   500 mg at 09/25/22 0756   haloperidol (HALDOL) tablet 5 mg  5 mg Oral TID PRN Onuoha, Chinwendu V, NP       Or   haloperidol lactate (HALDOL) injection 5 mg  5 mg Intramuscular TID PRN Onuoha, Chinwendu V, NP       hydrOXYzine (ATARAX)  tablet 25 mg  25 mg Oral TID PRN Onuoha, Chinwendu V, NP   25 mg at 09/24/22 2001   magnesium hydroxide (MILK OF MAGNESIA) suspension 30 mL  30 mL Oral Daily PRN Onuoha, Chinwendu V, NP       metoprolol succinate (TOPROL-XL) 24 hr tablet 50 mg  50 mg Oral Daily Lewanda Rife, MD   50 mg at 09/25/22 0755   mirtazapine (REMERON) tablet 15 mg  15 mg Oral QHS Lewanda Rife, MD   15 mg at 09/24/22 2134   QUEtiapine (SEROQUEL) tablet 200 mg  200 mg Oral QHS Sarina Ill, DO   200 mg at 09/24/22 2124   risperiDONE (RISPERDAL) tablet 0.5 mg  0.5 mg Oral [id number] Sarina Ill, DO   0.5 mg at 09/25/22 0755   rOPINIRole (REQUIP) tablet 1 mg  1 mg Oral QHS Sarina Ill, DO   1 mg at 09/24/22 2124    Lab Results: No results found for this or any previous visit (from the past 48 hour(s)).  Blood Alcohol level:  Lab Results  Component Value Date   ETH <10 09/15/2022   ETH <10 07/05/2022    Metabolic Disorder Labs: Lab Results  Component Value Date   HGBA1C 5.5 04/16/2022   MPG 111 04/16/2022   MPG 105.41 01/18/2021   No results found for: "PROLACTIN" Lab Results  Component Value Date   CHOL 184 04/16/2022   TRIG 133 04/16/2022   HDL 32 (L) 04/16/2022   CHOLHDL 5.8 04/16/2022   VLDL 27 04/16/2022   LDLCALC 125 (H) 04/16/2022   LDLCALC 128 (H) 01/18/2021    Physical Findings: AIMS:  , ,  ,  ,    CIWA:    COWS:     Musculoskeletal: Strength & Muscle Tone: within normal limits Gait & Station:  normal Patient leans: N/A  Psychiatric Specialty Exam:  Presentation  General Appearance:  Appropriate for Environment  Eye Contact: Fair  Speech: Clear and Coherent  Speech Volume: Normal  Handedness: Right   Mood and Affect  Mood: Anxious; Depressed  Affect: Congruent; Constricted   Thought Process  Thought Processes: Coherent; Goal Directed  Descriptions of Associations:Intact  Orientation:Full (Time, Place and Person)  Thought Content:Abstract Reasoning; Logical; Rumination  History of Schizophrenia/Schizoaffective disorder:No  Duration of Psychotic Symptoms:Greater than six months  Hallucinations:No data recorded Ideas of Reference:None  Suicidal Thoughts:No data recorded Homicidal Thoughts:No data recorded  Sensorium  Memory: Immediate Fair; Recent Fair; Remote Fair  Judgment: Impaired  Insight: Shallow   Executive Functions  Concentration: Fair  Attention Span: Fair  Recall: Fair  Fund of Knowledge: Good  Language: Fair   Psychomotor Activity  Psychomotor Activity:No data recorded  Assets  Assets: Communication Skills; Financial Resources/Insurance   Sleep  Sleep:No data recorded   Physical Exam: Physical Exam Vitals and nursing note reviewed.  Constitutional:      Appearance: Normal appearance. He is normal weight.  Neurological:     General: No focal deficit present.     Mental Status: He is alert and oriented to person, place, and time.  Psychiatric:        Attention and Perception: Attention and perception normal.        Mood and Affect: Mood normal. Affect is flat.        Speech: Speech normal.        Behavior: Behavior normal. Behavior is cooperative.        Thought Content: Thought content is paranoid.  Cognition and Memory: Memory normal.        Judgment: Judgment normal.    Review of Systems  Constitutional: Negative.   HENT: Negative.    Eyes: Negative.   Respiratory: Negative.     Cardiovascular: Negative.   Gastrointestinal: Negative.   Genitourinary: Negative.   Musculoskeletal: Negative.   Skin: Negative.   Neurological: Negative.   Endo/Heme/Allergies: Negative.   Psychiatric/Behavioral:  Positive for depression. The patient has insomnia.    Blood pressure 121/81, pulse 81, temperature 98.1 F (36.7 C), resp. rate 18, height 5\' 9"  (1.753 m), weight 79.8 kg, SpO2 98%. Body mass index is 25.99 kg/m.   Treatment Plan Summary: Daily contact with patient to assess and evaluate symptoms and progress in treatment, Medication management, and Plan continue current medications.  Sarina Ill, DO 09/25/2022, 11:31 AM

## 2022-09-25 NOTE — Progress Notes (Signed)
   09/25/22 1925  Psych Admission Type (Psych Patients Only)  Admission Status Voluntary  Psychosocial Assessment  Patient Complaints Anxiety;Depression  Eye Contact Fair  Facial Expression Anxious  Affect Depressed;Anxious  Speech Logical/coherent  Interaction Assertive  Motor Activity Slow  Appearance/Hygiene Unremarkable;In scrubs  Behavior Characteristics Cooperative;Appropriate to situation;Anxious  Mood Pleasant;Depressed;Anxious  Thought Process  Coherency WDL  Content WDL  Delusions None reported or observed  Perception Hallucinations  Hallucination Auditory;Command (voices tell him to harm himself but he doesn't listen to them)  Judgment Impaired  Confusion None  Danger to Self  Current suicidal ideation? Denies  Agreement Not to Harm Self Yes  Description of Agreement verbal  Danger to Others  Danger to Others None reported or observed   Progress note   D: Pt seen at nurse's station. Pt asking for anxiety and pain medicine. Pt denies SI, HI, VH. Endorses command auditory hallucinations that tell him to harm himself but he states he occupies his mind with other things when he hears them. "Right now, I feel like I don't want to be on this earth, but I don't want to kill myself." Pt rates pain  9/10 as tension that is brought on by anxiety. He has been sitting in the dayroom more today. "I think it's the noise. I'm okay when there are a few people but when the noise level goes up, so does the tension. I take medicine and then lay down for a bit and it eases off."  Pt rates anxiety  9/10 and depression  10/10.Pt states that he has a lot on him and that his depression is always high. He discusses his girlfriend who is in a nursing home with dementia. He talks about being charged with elder abuse when she went in the home. He believes he will have to go to jail when he is discharged from the unit because he missed his probation meeting. "I don't want to go but if I do, I'll tell  them I was in the hospital." Pt encouraged to speak with SW about what documentation he will need to confirm he has been in the hospital. Pt states he feels better than when he was admitted, but has a lot of worries. Does say that talking about it has eased some of the tension. No other concerns noted at this time.  A: Pt provided support and encouragement. Pt given scheduled medication as prescribed. PRNs as appropriate. Q15 min checks for safety.   R: Pt safe on the unit. Will continue to monitor.

## 2022-09-25 NOTE — Plan of Care (Signed)
  Problem: Education: Goal: Knowledge of General Education information will improve Description: Including pain rating scale, medication(s)/side effects and non-pharmacologic comfort measures Outcome: Progressing   Problem: Nutrition: Goal: Adequate nutrition will be maintained Outcome: Progressing   Problem: Safety: Goal: Ability to remain free from injury will improve Outcome: Progressing   Problem: Coping: Goal: Level of anxiety will decrease Outcome: Not Progressing

## 2022-09-25 NOTE — Progress Notes (Signed)
Medications given early per patient request.

## 2022-09-25 NOTE — Progress Notes (Signed)
   09/25/22 1610  15 Minute Checks  Location Bedroom  Visual Appearance Calm  Behavior Sleeping  Sleep (Behavioral Health Patients Only)  Calculate sleep? (Click Yes once per 24 hr at 0600 safety check) Yes  Documented sleep last 24 hours 13.25

## 2022-09-25 NOTE — Group Note (Signed)
Date:  09/25/2022 Time:  11:04 AM  Group Topic/Focus:  Outside Rec/Music Therapy This group allows paitents to get out and get fresh air while listening to their favorite music   Participation Level:  Did Not Attend  Participation Quality:    Affect:    Cognitive:    Insight:   Engagement in Group:    Modes of Intervention:    Additional Comments:  Did not attend   Sathvika Ojo T Keeshawn Fakhouri 09/25/2022, 11:04 AM

## 2022-09-26 DIAGNOSIS — F3130 Bipolar disorder, current episode depressed, mild or moderate severity, unspecified: Secondary | ICD-10-CM | POA: Diagnosis not present

## 2022-09-26 MED ORDER — BUPROPION HCL ER (XL) 300 MG PO TB24
300.0000 mg | ORAL_TABLET | Freq: Every day | ORAL | Status: DC
  Administered 2022-09-27 – 2022-10-02 (×6): 300 mg via ORAL
  Filled 2022-09-26 (×6): qty 1

## 2022-09-26 NOTE — Progress Notes (Signed)
Dep 8/10 No pain  No svh

## 2022-09-26 NOTE — Progress Notes (Signed)
   09/26/22 0737  Psych Admission Type (Psych Patients Only)  Admission Status Voluntary  Psychosocial Assessment  Patient Complaints Anxiety;Depression  Eye Contact Brief  Facial Expression Anxious;Sullen  Affect Anxious;Depressed  Speech Logical/coherent  Interaction Assertive  Motor Activity Slow  Appearance/Hygiene Body odor;In scrubs  Behavior Characteristics Cooperative  Mood Pleasant  Thought Process  Coherency WDL  Content WDL  Delusions None reported or observed  Perception Hallucinations  Hallucination Auditory  Judgment WDL  Confusion None  Danger to Self  Current suicidal ideation? Denies  Danger to Others  Danger to Others None reported or observed

## 2022-09-26 NOTE — BH IP Treatment Plan (Signed)
Interdisciplinary Treatment and Diagnostic Plan Update  09/26/2022 Time of Session: 9:00 AM  Sklyer Bage MRN: [id number]  Principal Diagnosis: Bipolar I disorder, most recent episode depressed (HCC)  Secondary Diagnoses: Principal Problem:   Bipolar I disorder, most recent episode depressed (HCC) Active Problems:   PTSD (post-traumatic stress disorder)   Current Medications:  Current Facility-Administered Medications  Medication Dose Route Frequency Provider Last Rate Last Admin   acetaminophen (TYLENOL) tablet 650 mg  650 mg Oral Q6H PRN Onuoha, Chinwendu V, NP   650 mg at 09/26/22 0816   alum & mag hydroxide-simeth (MAALOX/MYLANTA) 200-200-20 MG/5ML suspension 30 mL  30 mL Oral Q4H PRN Onuoha, Chinwendu V, NP       baclofen (LIORESAL) tablet 5 mg  5 mg Oral BID Lewanda Rife, MD   5 mg at 09/26/22 0815   [START ON 09/27/2022] buPROPion (WELLBUTRIN XL) 24 hr tablet 300 mg  300 mg Oral Daily Sarina Ill, DO       busPIRone (BUSPAR) tablet 5 mg  5 mg Oral BID Lewanda Rife, MD   5 mg at 09/26/22 0816   divalproex (DEPAKOTE) DR tablet 500 mg  500 mg Oral BID Lewanda Rife, MD   500 mg at 09/26/22 0815   haloperidol (HALDOL) tablet 5 mg  5 mg Oral TID PRN Onuoha, Chinwendu V, NP       Or   haloperidol lactate (HALDOL) injection 5 mg  5 mg Intramuscular TID PRN Onuoha, Chinwendu V, NP       hydrOXYzine (ATARAX) tablet 25 mg  25 mg Oral TID PRN Onuoha, Chinwendu V, NP   25 mg at 09/26/22 0816   magnesium hydroxide (MILK OF MAGNESIA) suspension 30 mL  30 mL Oral Daily PRN Onuoha, Chinwendu V, NP       metoprolol succinate (TOPROL-XL) 24 hr tablet 50 mg  50 mg Oral Daily Lewanda Rife, MD   50 mg at 09/26/22 0815   mirtazapine (REMERON) tablet 15 mg  15 mg Oral QHS Lewanda Rife, MD   15 mg at 09/25/22 2124   QUEtiapine (SEROQUEL) tablet 200 mg  200 mg Oral QHS Sarina Ill, DO   200 mg at 09/25/22 2124   rOPINIRole (REQUIP) tablet 1 mg  1 mg  Oral QHS Sarina Ill, DO   1 mg at 09/25/22 2124   PTA Medications: Medications Prior to Admission  Medication Sig Dispense Refill Last Dose   butalbital-acetaminophen-caffeine (FIORICET) 50-325-40 MG tablet Take 1 tablet by mouth every 6 (six) hours as needed for headache or migraine.      divalproex (DEPAKOTE) 500 MG DR tablet Take 1 tablet (500 mg total) by mouth 2 (two) times daily. 60 tablet 3    doxepin (SINEQUAN) 100 MG capsule Take 100 mg by mouth at bedtime.      enalapril (VASOTEC) 20 MG tablet Take 20 mg by mouth daily. (Patient not taking: Reported on 09/15/2022)      enalapril (VASOTEC) 20 MG tablet Take 1 tablet (20 mg total) by mouth daily. (Patient not taking: Reported on 09/15/2022) 30 tablet 3    FLUoxetine (PROZAC) 20 MG capsule Take 1 capsule (20 mg total) by mouth daily. (Patient not taking: Reported on 09/15/2022) 30 capsule 0    FLUoxetine (PROZAC) 20 MG capsule Take 1 capsule (20 mg total) by mouth daily. (Patient not taking: Reported on 09/15/2022) 30 capsule 3    haloperidol (HALDOL) 5 MG tablet Take 5 mg by mouth every 6 (six) hours as  needed for agitation. (Patient not taking: Reported on 09/15/2022)      metoprolol succinate (TOPROL-XL) 50 MG 24 hr tablet Take 1 tablet (50 mg total) by mouth daily. 30 tablet 3    risperiDONE (RISPERDAL) 1 MG tablet Take 1 tablet (1 mg total) by mouth 2 (two) times daily at 8 am and 4 pm. (Patient not taking: Reported on 09/15/2022) 60 tablet 3    traZODone (DESYREL) 100 MG tablet Take 1 tablet (100 mg total) by mouth at bedtime as needed for sleep. 30 tablet 3    traZODone (DESYREL) 50 MG tablet Take 1 tablet (50 mg total) by mouth at bedtime as needed (Anxiety). (Patient not taking: Reported on 09/15/2022) 30 tablet 0    zolpidem (AMBIEN) 10 MG tablet Take 10 mg by mouth at bedtime.       Patient Stressors: Medication change or noncompliance    Patient Strengths: Ability for insight  Capable of independent living  Investment banker, corporate  Motivation for treatment/growth   Treatment Modalities: Medication Management, Group therapy, Case management,  1 to 1 session with clinician, Psychoeducation, Recreational therapy.   Physician Treatment Plan for Primary Diagnosis: Bipolar I disorder, most recent episode depressed (HCC) Long Term Goal(s): Improvement in symptoms so as ready for discharge   Short Term Goals: Ability to identify changes in lifestyle to reduce recurrence of condition will improve Ability to verbalize feelings will improve Ability to disclose and discuss suicidal ideas Ability to demonstrate self-control will improve Ability to identify and develop effective coping behaviors will improve Ability to maintain clinical measurements within normal limits will improve Compliance with prescribed medications will improve Ability to identify triggers associated with substance abuse/mental health issues will improve  Medication Management: Evaluate patient's response, side effects, and tolerance of medication regimen.  Therapeutic Interventions: 1 to 1 sessions, Unit Group sessions and Medication administration.  Evaluation of Outcomes: Progressing  Physician Treatment Plan for Secondary Diagnosis: Principal Problem:   Bipolar I disorder, most recent episode depressed (HCC) Active Problems:   PTSD (post-traumatic stress disorder)  Long Term Goal(s): Improvement in symptoms so as ready for discharge   Short Term Goals: Ability to identify changes in lifestyle to reduce recurrence of condition will improve Ability to verbalize feelings will improve Ability to disclose and discuss suicidal ideas Ability to demonstrate self-control will improve Ability to identify and develop effective coping behaviors will improve Ability to maintain clinical measurements within normal limits will improve Compliance with prescribed medications will improve Ability to identify triggers associated with substance  abuse/mental health issues will improve     Medication Management: Evaluate patient's response, side effects, and tolerance of medication regimen.  Therapeutic Interventions: 1 to 1 sessions, Unit Group sessions and Medication administration.  Evaluation of Outcomes: Progressing   RN Treatment Plan for Primary Diagnosis: Bipolar I disorder, most recent episode depressed (HCC) Long Term Goal(s): Knowledge of disease and therapeutic regimen to maintain health will improve  Short Term Goals: Ability to remain free from injury will improve, Ability to verbalize frustration and anger appropriately will improve, Ability to demonstrate self-control, Ability to participate in decision making will improve, Ability to verbalize feelings will improve, Ability to disclose and discuss suicidal ideas, Ability to identify and develop effective coping behaviors will improve, and Compliance with prescribed medications will improve  Medication Management: RN will administer medications as ordered by provider, will assess and evaluate patient's response and provide education to patient for prescribed medication. RN will report any adverse and/or side effects  to prescribing provider.  Therapeutic Interventions: 1 on 1 counseling sessions, Psychoeducation, Medication administration, Evaluate responses to treatment, Monitor vital signs and CBGs as ordered, Perform/monitor CIWA, COWS, AIMS and Fall Risk screenings as ordered, Perform wound care treatments as ordered.  Evaluation of Outcomes: Progressing   LCSW Treatment Plan for Primary Diagnosis: Bipolar I disorder, most recent episode depressed (HCC) Long Term Goal(s): Safe transition to appropriate next level of care at discharge, Engage patient in therapeutic group addressing interpersonal concerns.  Short Term Goals: Engage patient in aftercare planning with referrals and resources, Increase social support, Increase ability to appropriately verbalize feelings,  Increase emotional regulation, Facilitate acceptance of mental health diagnosis and concerns, Facilitate patient progression through stages of change regarding substance use diagnoses and concerns, Identify triggers associated with mental health/substance abuse issues, and Increase skills for wellness and recovery  Therapeutic Interventions: Assess for all discharge needs, 1 to 1 time with Social worker, Explore available resources and support systems, Assess for adequacy in community support network, Educate family and significant other(s) on suicide prevention, Complete Psychosocial Assessment, Interpersonal group therapy.  Evaluation of Outcomes: Progressing   Progress in Treatment: Attending groups: No. Participating in groups: No. Taking medication as prescribed: Yes. Toleration medication: Yes. Family/Significant other contact made: No, will contact:  CSW will contact if given permission Patient understands diagnosis: Yes. Discussing patient identified problems/goals with staff: Yes. Medical problems stabilized or resolved: Yes. Denies suicidal/homicidal ideation: Yes. Issues/concerns per patient self-inventory: No. Other: None   New problem(s) identified: No, Describe:  None identified   New Short Term/Long Term Goal(s): d elimination of symptoms of psychosis, medication management for mood stabilization; elimination of SI thoughts; development of comprehensive mental wellness plan.    Patient Goals:  " I don't have any goals" Update 09/21/22: No changes at this time Update 09/26/2022: No changes at this time   Discharge Plan or Barriers: CSW will assist with appropriate discharge planning   Reason for Continuation of Hospitalization: Depression Medication stabilization  Estimated Length of Stay: 1 to 7 days   Last 3 [place] Suicide Severity Risk Score: Flowsheet Row Admission (Current) from 09/15/2022 in [facility] BE[facility]IC[facility] Most recent reading at 09/15/2022 10:00  PM ED from 09/15/2022 in [facility] Emergency Department at [facility] Most recent reading at 09/15/2022  5:26 PM Admission (Discharged) from 07/06/2022 in [facility] INPATIENT BEHAVIORAL MEDICINE Most recent reading at 07/06/2022  3:14 PM  C-SSRS RISK CATEGORY Moderate Risk High Risk No Risk       Last PHQ 2/9 Scores:     No data to display          Scribe for Treatment Team: [name], Theresia Majors 09/26/2022 3:47 PM

## 2022-09-26 NOTE — Progress Notes (Signed)
[facility] MD Progress Note  09/26/2022 1:21 PM [name]  MRN:  [id number] Subjective: [name] is seen on rounds.  He states that he is still very depressed.  He remains flat and isolative to his room.  He is upset that he gets woken up at night from staff checking on him.  He understands this.  He says that he has not felt any better since starting back on medications.  I talked to him about some med changes.  He needs a Depakote level also.  Nurses report no issues.  He has no complaints. Principal Problem: Bipolar I disorder, most recent episode depressed (HCC) Diagnosis: Principal Problem:   Bipolar I disorder, most recent episode depressed (HCC) Active Problems:   PTSD (post-traumatic stress disorder)  Total Time spent with patient: 15 minutes  Past Psychiatric History: PTSD and bipolar disorder  Past Medical History:  Past Medical History:  Diagnosis Date   Anxiety    Arthritis    knees and hands   Bipolar 1 disorder, depressed (HCC)    Bipolar disorder (HCC)    Depression    GERD (gastroesophageal reflux disease)    Hepatitis    HEP "C"   History of kidney stones    Hypertension    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 echo 5/17: EF 60-65%, no RWMA, LV dias fxn nl, LA mildly dilated, RV sys fxn nl, PASP nl, moderate sized circumferential pericardial effusion was identified, 2.12 cm around the LV free wall, <1 cm around the RV free wall. Features were not c/w tamponade physiology   PTSD (post-traumatic stress disorder)    Witnessed brother's suicide.   Restless leg syndrome    Seizures (HCC)    Syncope     Past Surgical History:  Procedure Laterality Date   CYSTOSCOPY WITH URETEROSCOPY AND STENT PLACEMENT     ESOPHAGOGASTRODUODENOSCOPY N/A 01/11/2016   Procedure: ESOPHAGOGASTRODUODENOSCOPY (EGD);  Surgeon: Charlott Rakes, MD;  Location: [facility] ENDOSCOPY;  Service: Endoscopy;  Laterality: N/A;    ESOPHAGOGASTRODUODENOSCOPY N/A 04/09/2020   Procedure: ESOPHAGOGASTRODUODENOSCOPY (EGD);  Surgeon: [name], MD;  Location: [facility] ENDOSCOPY;  Service: Gastroenterology;  Laterality: N/A;   INCISION AND DRAINAGE ABSCESS Left 01/02/2018   Procedure: INCISION AND DRAINAGE LEFT KNEE;  Surgeon: Deeann Saint, MD;  Location: [facility] ORS;  Service: Orthopedics;  Laterality: Left;   JOINT REPLACEMENT Right    TKR   KNEE ARTHROSCOPY Right 06/25/2014   Procedure: ARTHROSCOPY KNEE;  Surgeon: Deeann Saint, MD;  Location: [facility] ORS;  Service: Orthopedics;  Laterality: Right;  partial arthroscopic medial menisectomy   LAPAROSCOPIC APPENDECTOMY N/A 06/02/2021   Procedure: APPENDECTOMY LAPAROSCOPIC;  Surgeon: Campbell Lerner, MD;  Location: [facility] ORS;  Service: General;  Laterality: N/A;   TOTAL KNEE ARTHROPLASTY Right 04/22/2015   Procedure: TOTAL KNEE ARTHROPLASTY;  Surgeon: Deeann Saint, MD;  Location: [facility];  Service: Orthopedics;  Laterality: Right;   TOTAL KNEE ARTHROPLASTY Left 10/30/2017   Procedure: TOTAL KNEE ARTHROPLASTY;  Surgeon: Deeann Saint, MD;  Location: [facility]S;  Service: Orthopedics;  Laterality: Left;   TOTAL KNEE REVISION Left 01/02/2018   Procedure: poly exchange of tibia and patella left knee;  Surgeon: Deeann Saint, MD;  Location: [facility];  Service: Orthopedics;  Laterality: Left;   UMBILICAL HERNIA REPAIR  06/02/2021   Procedure: HERNIA REPAIR UMBILICAL ADULT;  Surgeon: Campbell Lerner, MD;  Location: [facility] ORS;  Service: General;;   Family History:  Family History  Problem Relation Age of Onset   CVA Mother        deceased at age 44   Depression Brother        Died by suicide at age 77   Family Psychiatric  History: Unremarkable Social History:  Social History   Substance and Sexual Activity  Alcohol Use Not Currently   Comment: rare     Social History   Substance and Sexual Activity  Drug Use Yes   Types: Marijuana, Cocaine    Social History   Socioeconomic  History   Marital status: Single    Spouse name: Not on file   Number of children: Not on file   Years of education: Not on file   Highest education level: Not on file  Occupational History   Not on file  Tobacco Use   Smoking status: Former    Current packs/day: 0.00    Average packs/day: 0.8 packs/day for 20.0 years (15.0 ttl pk-yrs)    Types: Cigarettes    Start date: 05/16/1964    Quit date: 05/16/1984    Years since quitting: 38.3   Smokeless tobacco: Never  Vaping Use   Vaping status: Never Used  Substance and Sexual Activity   Alcohol use: Not Currently    Comment: rare   Drug use: Yes    Types: Marijuana, Cocaine   Sexual activity: Not on file  Other Topics Concern   Not on file  Social History Narrative   ** Merged History Encounter **       Social Determinants of Health   Financial Resource Strain: Not on file  Food Insecurity: Food Insecurity Present (09/15/2022)   Hunger Vital Sign    Worried About Running Out of Food in the Last Year: Sometimes true    Ran Out of Food in the Last Year: Sometimes true  Transportation Needs: Unmet Transportation Needs (09/15/2022)   PRAPARE - Administrator, Civil Service (Medical): Yes    Lack of Transportation (Non-Medical): Yes  Physical Activity: Not on file  Stress: Not on file  Social Connections: Not on file   Additional Social History:                         Sleep: Poor  Appetite:  Fair  Current Medications: Current Facility-Administered Medications  Medication Dose Route Frequency Provider Last Rate Last Admin   acetaminophen (TYLENOL) tablet 650 mg  650 mg Oral Q6H PRN Onuoha, Chinwendu V, NP   650 mg at 09/26/22 0816   alum & mag hydroxide-simeth (MAALOX/MYLANTA) 200-200-20 MG/5ML suspension 30 mL  30 mL Oral Q4H PRN Onuoha, Chinwendu V, NP       baclofen (LIORESAL) tablet 5 mg  5 mg Oral BID Lewanda Rife, MD   5 mg at 09/26/22 0815   buPROPion (WELLBUTRIN XL) 24 hr tablet 150 mg  150  mg Oral Daily Sarina Ill, DO   150 mg at 09/26/22 0816   busPIRone (BUSPAR) tablet 5 mg  5 mg Oral BID Lewanda Rife, MD   5 mg at 09/26/22 0816   divalproex (DEPAKOTE) DR tablet 500 mg  500 mg Oral BID Lewanda Rife, MD   500 mg at 09/26/22 0815   haloperidol (HALDOL) tablet 5 mg  5 mg Oral TID PRN Onuoha, Chinwendu V, NP       Or   haloperidol lactate (HALDOL) injection 5 mg  5 mg Intramuscular TID PRN Onuoha, Chinwendu V, NP  hydrOXYzine (ATARAX) tablet 25 mg  25 mg Oral TID PRN Onuoha, Chinwendu V, NP   25 mg at 09/26/22 0816   magnesium hydroxide (MILK OF MAGNESIA) suspension 30 mL  30 mL Oral Daily PRN Onuoha, Chinwendu V, NP       metoprolol succinate (TOPROL-XL) 24 hr tablet 50 mg  50 mg Oral Daily Lewanda Rife, MD   50 mg at 09/26/22 0815   mirtazapine (REMERON) tablet 15 mg  15 mg Oral QHS Lewanda Rife, MD   15 mg at 09/25/22 2124   QUEtiapine (SEROQUEL) tablet 200 mg  200 mg Oral QHS Sarina Ill, DO   200 mg at 09/25/22 2124   risperiDONE (RISPERDAL) tablet 0.5 mg  0.5 mg Oral [id number] Sarina Ill, DO   0.5 mg at 09/26/22 0816   rOPINIRole (REQUIP) tablet 1 mg  1 mg Oral QHS Sarina Ill, DO   1 mg at 09/25/22 2124    Lab Results: No results found for this or any previous visit (from the past 48 hour(s)).  Blood Alcohol level:  Lab Results  Component Value Date   ETH <10 09/15/2022   ETH <10 07/05/2022    Metabolic Disorder Labs: Lab Results  Component Value Date   HGBA1C 5.5 04/16/2022   MPG 111 04/16/2022   MPG 105.41 01/18/2021   No results found for: "PROLACTIN" Lab Results  Component Value Date   CHOL 184 04/16/2022   TRIG 133 04/16/2022   HDL 32 (L) 04/16/2022   CHOLHDL 5.8 04/16/2022   VLDL 27 04/16/2022   LDLCALC 125 (H) 04/16/2022   LDLCALC 128 (H) 01/18/2021    Physical Findings: AIMS:  , ,  ,  ,    CIWA:    COWS:     Musculoskeletal: Strength & Muscle Tone: within normal  limits Gait & Station: normal Patient leans: N/A  Psychiatric Specialty Exam:  Presentation  General Appearance:  Appropriate for Environment  Eye Contact: Fair  Speech: Clear and Coherent  Speech Volume: Normal  Handedness: Right   Mood and Affect  Mood: Anxious; Depressed  Affect: Congruent; Constricted   Thought Process  Thought Processes: Coherent; Goal Directed  Descriptions of Associations:Intact  Orientation:Full (Time, Place and Person)  Thought Content:Abstract Reasoning; Logical; Rumination  History of Schizophrenia/Schizoaffective disorder:No  Duration of Psychotic Symptoms:Greater than six months  Hallucinations:No data recorded Ideas of Reference:None  Suicidal Thoughts:No data recorded Homicidal Thoughts:No data recorded  Sensorium  Memory: Immediate Fair; Recent Fair; Remote Fair  Judgment: Impaired  Insight: Shallow   Executive Functions  Concentration: Fair  Attention Span: Fair  Recall: Fair  Fund of Knowledge: Good  Language: Fair   Psychomotor Activity  Psychomotor Activity:No data recorded  Assets  Assets: Communication Skills; Financial Resources/Insurance   Sleep  Sleep:No data recorded   Physical Exam: Physical Exam Vitals and nursing note reviewed.  Constitutional:      Appearance: Normal appearance. He is normal weight.  Neurological:     General: No focal deficit present.     Mental Status: He is alert and oriented to person, place, and time.  Psychiatric:        Attention and Perception: Attention and perception normal.        Mood and Affect: Mood is anxious and depressed. Affect is flat.        Speech: Speech normal.        Behavior: Behavior normal. Behavior is cooperative.        Thought Content: Thought content  normal.        Cognition and Memory: Cognition and memory normal.        Judgment: Judgment normal.    Review of Systems  Constitutional: Negative.   HENT: Negative.     Eyes: Negative.   Respiratory: Negative.    Cardiovascular: Negative.   Gastrointestinal: Negative.   Genitourinary: Negative.   Musculoskeletal: Negative.   Skin: Negative.   Neurological: Negative.   Endo/Heme/Allergies: Negative.   Psychiatric/Behavioral:  Positive for depression.    Blood pressure (!) 116/90, pulse 81, temperature 97.9 F (36.6 C), resp. rate 17, height 5\' 9"  (1.753 m), weight 79.8 kg, SpO2 95%. Body mass index is 25.99 kg/m.   Treatment Plan Summary: Daily contact with patient to assess and evaluate symptoms and progress in treatment, Medication management, and Plan Depakote level in the morning.  Discontinue Risperdal because he is not getting any benefit from it.  Increase Wellbutrin XL to 300 mg/day.  Maryjean Corpening Tresea Mall, DO 09/26/2022, 1:21 PM

## 2022-09-26 NOTE — Group Note (Signed)
Date:  09/26/2022 Time:  10:01 AM  Group Topic/Focus:  Making Healthy Choices:   The focus of this group is to help patients identify negative/unhealthy choices they were using prior to admission and identify positive/healthier coping strategies to replace them upon discharge.    Participation Level:  Did Not Attend    Rodena Goldmann 09/26/2022, 10:01 AM

## 2022-09-26 NOTE — Group Note (Unsigned)
Date:  09/27/2022 Time:  12:18 AM  Group Topic/Focus:  Building Self Esteem:   The Focus of this group is helping patients become aware of the effects of self-esteem on their lives, the things they and others do that enhance or undermine their self-esteem, seeing the relationship between their level of self-esteem and the choices they make and learning ways to enhance self-esteem.    Participation Level:  Active  Participation Quality:  Appropriate  Affect:  Appropriate  Cognitive:  Appropriate  Insight: Appropriate  Engagement in Group:  Engaged  Modes of Intervention:  Discussion  Additional Comments:    Maeola Harman 09/27/2022, 12:18 AM

## 2022-09-26 NOTE — Progress Notes (Signed)
   09/26/22 0555  15 Minute Checks  Location Bedroom  Visual Appearance Calm  Behavior Sleeping  Sleep (Behavioral Health Patients Only)  Calculate sleep? (Click Yes once per 24 hr at 0600 safety check) Yes  Documented sleep last 24 hours 7.5

## 2022-09-26 NOTE — Progress Notes (Signed)
Patient c/o headache 8/10.  He also c/o being "anxious".  He was given Acetaminophen 650mg  po & Hydroxyzine 25mg  po as per PRN order.  Plan of care continued.

## 2022-09-26 NOTE — Group Note (Signed)
Recreation Therapy Group Note   Group Topic:Goal Setting  Group Date: 09/26/2022 Start Time: 1400 End Time: 1500 Facilitators: Rosina Lowenstein, LRT, CTRS Location:  Day room  Group Description: Vision Board. Patients were given many different magazines, a glue stick, markers, and a piece of cardstock paper. LRT and pts discussed the importance of having goals in life. LRT and pts discussed the difference between short-term and long-term goals, as well as what a SMART goal is. LRT encouraged pts to create a vision board, with images they picked and then cut out with safety scissors from the magazine, for themselves, that capture their short and long-term goals. LRT encouraged pts to show and explain their vision board to the group.   Goal Area(s) Addressed:  Patient will gain knowledge of short vs. long term goals.  Patient will identify goals for themselves. Patient will practice setting SMART goals. Patient will verbalize their goals to LRT and peers.   Affect/Mood: N/A   Participation Level: Did not attend    Clinical Observations/Individualized Feedback: [name] did not attend group.  Plan: Continue to engage patient in RT group sessions 2-3x/week.   Rosina Lowenstein, LRT, CTRS 09/26/2022 3:20 PM

## 2022-09-26 NOTE — Plan of Care (Signed)
Problem: Health Behavior/Discharge Planning: Goal: Ability to manage health-related needs will improve Outcome: Progressing   Problem: Clinical Measurements: Goal: Ability to maintain clinical measurements within normal limits will improve Outcome: Progressing Goal: Will remain free from infection Outcome: Progressing Goal: Diagnostic test results will improve Outcome: Progressing Goal: Respiratory complications will improve Outcome: Progressing Goal: Cardiovascular complication will be avoided Outcome: Progressing   Problem: Activity: Goal: Risk for activity intolerance will decrease Outcome: Progressing   Problem: Nutrition: Goal: Adequate nutrition will be maintained Outcome: Progressing   Problem: Pain Managment: Goal: General experience of comfort will improve Outcome: Progressing

## 2022-09-27 DIAGNOSIS — F3130 Bipolar disorder, current episode depressed, mild or moderate severity, unspecified: Secondary | ICD-10-CM | POA: Diagnosis not present

## 2022-09-27 DIAGNOSIS — F4310 Post-traumatic stress disorder, unspecified: Secondary | ICD-10-CM | POA: Diagnosis not present

## 2022-09-27 LAB — VALPROIC ACID LEVEL: Valproic Acid Lvl: 27 ug/mL — ABNORMAL LOW (ref 50.0–100.0)

## 2022-09-27 NOTE — Progress Notes (Signed)
[facility] MD Progress Note  09/27/2022 [name]  MRN:  [id number]  Subjective: Case discussed with staff, chart reviewed, patient seen during rounds.  Per staff report patient stays in his room for most of the day.  Today during assessment patient endorsed depressed mood, anhedonia, he feels helpless and hopeless.  Although he denies suicidal or homicidal ideation which is an improvement from the day of admission.  Patient was encouraged to participate in milieu and attend groups.  Patient reports his sleep has improved.  He is eating better.  Patient was encouraged to work on coping strategies.   Principal Problem: Bipolar I disorder, most recent episode depressed (HCC) Diagnosis: Principal Problem:   Bipolar I disorder, most recent episode depressed (HCC) Active Problems:   PTSD (post-traumatic stress disorder)    Past Psychiatric History: Reports h/o Bipolar disorder, anxiety and PTSD. Reports  multiple psychiatric hospitalization. Denies past suicide attempts.   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 echo 5/17: EF 60-65%, no RWMA, LV dias fxn nl, LA mildly dilated, RV sys fxn nl, PASP nl, moderate sized circumferential pericardial effusion was identified, 2.12 cm around the LV free wall, <1 cm around the RV free wall. Features were not c/w tamponade physiology   PTSD (post-traumatic stress disorder)    Witnessed brother's suicide.   Restless leg syndrome    Seizures (HCC)    Syncope     Past Surgical History:  Procedure Laterality Date   CYSTOSCOPY WITH URETEROSCOPY AND STENT PLACEMENT     ESOPHAGOGASTRODUODENOSCOPY  N/A 01/11/2016   Procedure: ESOPHAGOGASTRODUODENOSCOPY (EGD);  Surgeon: Charlott Rakes, MD;  Location: [facility] ENDOSCOPY;  Service: Endoscopy;  Laterality: N/A;   ESOPHAGOGASTRODUODENOSCOPY N/A 04/09/2020   Procedure: ESOPHAGOGASTRODUODENOSCOPY (EGD);  Surgeon: [name], MD;  Location: [facility] ENDOSCOPY;  Service: Gastroenterology;  Laterality: N/A;   INCISION AND DRAINAGE ABSCESS Left 01/02/2018   Procedure: INCISION AND DRAINAGE LEFT KNEE;  Surgeon: Deeann Saint, MD;  Location: [facility] ORS;  Service: Orthopedics;  Laterality: Left;   JOINT REPLACEMENT Right    TKR   KNEE ARTHROSCOPY Right 06/25/2014   Procedure: ARTHROSCOPY KNEE;  Surgeon: Deeann Saint, MD;  Location: [facility] ORS;  Service: Orthopedics;  Laterality: Right;  partial arthroscopic medial menisectomy   LAPAROSCOPIC APPENDECTOMY N/A 06/02/2021   Procedure: APPENDECTOMY LAPAROSCOPIC;  Surgeon: Campbell Lerner, MD;  Location: [facility] ORS;  Service: General;  Laterality: N/A;   TOTAL KNEE ARTHROPLASTY Right 04/22/2015   Procedure: TOTAL KNEE ARTHROPLASTY;  Surgeon: Deeann Saint, MD;  Location: [facility] ORS;  Service: Orthopedics;  Laterality: Right;   TOTAL KNEE ARTHROPLASTY Left 10/30/2017   Procedure: TOTAL KNEE ARTHROPLASTY;  Surgeon: Deeann Saint, MD;  Location: [facility] ORS;  Service: Orthopedics;  Laterality: Left;   TOTAL KNEE REVISION Left 01/02/2018   Procedure: poly exchange of tibia and patella left knee;  Surgeon: Deeann Saint, MD;  Location: [facility] ORS;  Service: Orthopedics;  Laterality: Left;   UMBILICAL HERNIA REPAIR  06/02/2021   Procedure: HERNIA REPAIR UMBILICAL ADULT;  Surgeon: Campbell Lerner, MD;  Location: [facility]  ORS;  Service: General;;   Family History:  Family History  Problem Relation Age of Onset   CVA Mother        deceased at age 58   Depression Brother        Died by suicide at age 51    Social History:  Social History   Substance and Sexual Activity  Alcohol Use Not Currently   Comment: rare     Social  History   Substance and Sexual Activity  Drug Use Yes   Types: Marijuana, Cocaine    Social History   Socioeconomic History   Marital status: Single    Spouse name: Not on file   Number of children: Not on file   Years of education: Not on file   Highest education level: Not on file  Occupational History   Not on file  Tobacco Use   Smoking status: Former    Current packs/day: 0.00    Average packs/day: 0.8 packs/day for 20.0 years (15.0 ttl pk-yrs)    Types: Cigarettes    Start date: 05/16/1964    Quit date: 05/16/1984    Years since quitting: 38.3   Smokeless tobacco: Never  Vaping Use   Vaping status: Never Used  Substance and Sexual Activity   Alcohol use: Not Currently    Comment: rare   Drug use: Yes    Types: Marijuana, Cocaine   Sexual activity: Not on file  Other Topics Concern   Not on file  Social History Narrative   ** Merged History Encounter **       Social Determinants of Health   Financial Resource Strain: Not on file  Food Insecurity: Food Insecurity Present (09/15/2022)   Hunger Vital Sign    Worried About Running Out of Food in the Last Year: Sometimes true    Ran Out of Food in the Last Year: Sometimes true  Transportation Needs: Unmet Transportation Needs (09/15/2022)   PRAPARE - Administrator, Civil Service (Medical): Yes    Lack of Transportation (Non-Medical): Yes  Physical Activity: Not on file  Stress: Not on file  Social Connections: Not on file   Additional Social History:                         Sleep: Improved  Appetite:  Fair  Current Medications: Current Facility-Administered Medications  Medication Dose Route Frequency Provider Last Rate Last Admin   acetaminophen (TYLENOL) tablet 650 mg  650 mg Oral Q6H PRN Onuoha, Chinwendu V, NP   650 mg at 09/27/22 0757   alum & mag hydroxide-simeth (MAALOX/MYLANTA) 200-200-20 MG/5ML suspension 30 mL  30 mL Oral Q4H PRN Onuoha, Chinwendu V, NP       baclofen  (LIORESAL) tablet 5 mg  5 mg Oral BID Lewanda Rife, MD   5 mg at 09/27/22 0811   buPROPion (WELLBUTRIN XL) 24 hr tablet 300 mg  300 mg Oral Daily Sarina Ill, DO   300 mg at 09/27/22 0757   busPIRone (BUSPAR) tablet 5 mg  5 mg Oral BID Lewanda Rife, MD   5 mg at 09/27/22 0758   divalproex (DEPAKOTE) DR tablet 500 mg  500 mg Oral BID Lewanda Rife, MD   500 mg at 09/27/22 0759   haloperidol (HALDOL) tablet 5 mg  5 mg Oral TID PRN Onuoha, Chinwendu V, NP       Or   haloperidol lactate (HALDOL) injection 5 mg  5 mg  Intramuscular TID PRN Onuoha, Chinwendu V, NP       hydrOXYzine (ATARAX) tablet 25 mg  25 mg Oral TID PRN Onuoha, Chinwendu V, NP   25 mg at 09/27/22 0758   magnesium hydroxide (MILK OF MAGNESIA) suspension 30 mL  30 mL Oral Daily PRN Onuoha, Chinwendu V, NP       metoprolol succinate (TOPROL-XL) 24 hr tablet 50 mg  50 mg Oral Daily Lewanda Rife, MD   50 mg at 09/27/22 0758   mirtazapine (REMERON) tablet 15 mg  15 mg Oral QHS Lewanda Rife, MD   15 mg at 09/26/22 2135   QUEtiapine (SEROQUEL) tablet 200 mg  200 mg Oral QHS Sarina Ill, DO   200 mg at 09/26/22 2134   rOPINIRole (REQUIP) tablet 1 mg  1 mg Oral QHS Sarina Ill, DO   1 mg at 09/26/22 2133    Lab Results:  Results for orders placed or performed during the hospital encounter of 09/15/22 (from the past 48 hour(s))  Valproic acid level     Status: Abnormal   Collection Time: 09/27/22  8:13 AM  Result Value Ref Range   Valproic Acid Lvl 27 (L) 50.0 - 100.0 ug/mL    Comment: Performed at [facility], [address] [place], [place] [address]    Blood Alcohol level:  Lab Results  Component Value Date   [facility] <10 09/15/2022   ETH <10 07/05/2022    Metabolic Disorder Labs: Lab Results  Component Value Date   HGBA1C 5.5 04/16/2022   MPG 111 04/16/2022   MPG 105.41 01/18/2021   No results found for: "PROLACTIN" Lab Results  Component Value Date    CHOL 184 04/16/2022   TRIG 133 04/16/2022   HDL 32 (L) 04/16/2022   CHOLHDL 5.8 04/16/2022   VLDL 27 04/16/2022   LDLCALC 125 (H) 04/16/2022   LDLCALC 128 (H) 01/18/2021    Physical Findings: AIMS:  , ,  ,  ,    CIWA:    COWS:     Musculoskeletal: Strength & Muscle Tone: within normal limits Gait & Station: normal Patient leans: N/A   Psychiatric Specialty Exam:   Presentation  General Appearance:  Appropriate for Environment   Eye Contact: Fair   Speech: Clear and Coherent   Speech Volume: Normal   Handedness: Right     Mood and Affect  Mood: " depressed"   Affect: Congruent; Constricted     Thought Processes: Coherent; Goal Directed   Descriptions of Associations:Intact   Orientation:Full (Time, Place and Person)   Thought Content:Abstract Reasoning; Logical; Rumination   History of Schizophrenia/Schizoaffective disorder:No   Duration of Psychotic Symptoms:N/A   Hallucinations:Hallucinations: None   Ideas of Reference:None   Suicidal Thoughts:Suicidal Thoughts: Denies    Homicidal Thoughts:Homicidal Thoughts: No     Sensorium  Memory: Immediate Fair; Recent Fair; Remote Fair   Judgment: Improving   Insight: Fair     Chartered certified accountant: Fair   Attention Span: Fair   Recall: Fair   Fund of Knowledge: Good   Language: Fair       Psychomotor Activity: Psychomotor Activity: Decreased     Assets: Communication Skills; Financial Resources/Insurance     Sleep: Sleep:Improved     Physical Exam Constitutional:      Appearance: Normal appearance.  HENT:     Head: Normocephalic and atraumatic.     Nose: Nose normal.  Eyes:     Pupils: Pupils are equal, round, and reactive  to light.  Cardiovascular:     Rate and Rhythm: Normal rate.  Pulmonary:     Effort: Pulmonary effort is normal.  Skin:    General: Skin is warm.  Neurological:     General: No focal deficit present.     Mental Status:  He is alert and oriented to person, place, and time.      Review of Systems  HENT:  Negative for congestion and hearing loss.   Eyes:  Negative for blurred vision and double vision.  Respiratory:  Negative for cough and shortness of breath.   Cardiovascular:  Negative for chest pain and palpitations.  Gastrointestinal:  Negative for heartburn, nausea and vomiting.  Neurological:  Negative for dizziness and speech change.    Blood pressure 122/83, pulse 81, temperature 98.5 F (36.9 C), resp. rate 15, height 5\' 9"  (1.753 m), weight 79.8 kg, SpO2 98%. Body mass index is 25.99 kg/m.   Treatment Plan Summary: Daily contact with patient to assess and evaluate symptoms and progress in treatment and Medication management  Patient is admitted to locked unit under safety precautions Continue Depakote 500 mg by mouth twice daily for seizures and to target mood symptoms Increase the dose of Remeron to 15 mg at bedtime to help with depression, 09/17/2022 Continue home medicine for hypertension, metoprolol XL 50 mg daily Continue on BuSpar 5 mg by mouth twice daily for anxiety Patient was started on Seroquel on 09/22/2022 will continue on Seroquel  Continue to encourage sobriety Patient was encouraged to attend group and work on coping strategies Will recommend individual therapy upon discharge Baclofen 5 mg po BID to help muscle tension  Lewanda Rife, MD

## 2022-09-27 NOTE — Group Note (Signed)
Date:  09/27/2022 Time:  9:59 PM  Group Topic/Focus:  Early Warning Signs:   The focus of this group is to help patients identify signs or symptoms they exhibit before slipping into an unhealthy state or crisis.    Participation Level:  Minimal  Participation Quality:  Inattentive  Affect:  Anxious and Depressed  Cognitive:  Disorganized  Insight: Lacking  Engagement in Group:  Distracting, Engaged, and Lacking  Modes of Intervention:  Activity  Additional Comments:    [name] 09/27/2022, 9:59 PM

## 2022-09-27 NOTE — Group Note (Signed)
Recreation Therapy Group Note   Group Topic:General Recreation  Group Date: 09/27/2022 Start Time: 1400 End Time: 1455 Facilitators: Rosina Lowenstein, LRT, CTRS Location:  Day Room  Group Description: Bingo. LRT and patients played multiple games of Bingo with music playing in the background. LRT and pts discussed how this could be a leisure interest and the importance of doing things they enjoy post-discharge.   Goal Area(s) Addressed: Patient will identify leisure interests.  Patient will practice healthy decision making. Patient will engage in recreation activity.    Affect/Mood: Blunted and Flat   Participation Level: Minimal   Participation Quality: Minimal Cues   Behavior: Calm   Speech/Thought Process: Coherent   Insight: Limited   Judgement: Fair    Modes of Intervention: Cooperative Play   Patient Response to Interventions:  Receptive   Education Outcome:  In group clarification offered    Clinical Observations/Individualized Feedback: Khilyn was somewhat active in their participation of session activities and group discussion. Pt played bingo with peers. Pt interacted well duration of session.   Plan: Continue to engage patient in RT group sessions 2-3x/week.   Rosina Lowenstein, LRT, CTRS 09/27/2022 3:23 PM

## 2022-09-27 NOTE — Progress Notes (Signed)
   09/27/22 0724  Psych Admission Type (Psych Patients Only)  Admission Status Voluntary  Psychosocial Assessment  Patient Complaints Anxiety;Self-harm thoughts;Depression  Eye Contact Brief  Facial Expression Anxious;Sullen  Affect Anxious;Depressed  Speech Logical/coherent  Interaction Assertive  Motor Activity Slow  Appearance/Hygiene Body odor;In scrubs  Behavior Characteristics Calm  Mood Pleasant  Thought Process  Coherency WDL  Content WDL  Delusions None reported or observed  Perception Hallucinations  Hallucination Auditory  Judgment WDL  Confusion None  Danger to Self  Current suicidal ideation? Denies  Danger to Others  Danger to Others None reported or observed

## 2022-09-27 NOTE — Plan of Care (Signed)

## 2022-09-27 NOTE — Progress Notes (Signed)
   09/27/22 2000  Psych Admission Type (Psych Patients Only)  Admission Status Voluntary  Psychosocial Assessment  Patient Complaints Anxiety;Depression;Sleep disturbance;Other (Comment) (frustration)  Eye Contact Fair  Facial Expression Anxious;Sad  Affect Depressed;Anxious  Speech Logical/coherent  Interaction Assertive  Motor Activity Slow  Appearance/Hygiene In scrubs  Behavior Characteristics Cooperative;Anxious  Mood Depressed;Anxious;Pleasant  Thought Process  Coherency WDL  Content WDL  Delusions None reported or observed  Perception Hallucinations  Hallucination Auditory;Command  Judgment WDL  Confusion None  Danger to Self  Current suicidal ideation? Passive  Description of Suicide Plan plan would be to hang himself  Self-Injurious Behavior Some self-injurious ideation observed or expressed.  No lethal plan expressed   Agreement Not to Harm Self Yes  Description of Agreement verbal  Danger to Others  Danger to Others None reported or observed   Progress note   D: Pt seen in dayroom. Pt denies HI, VH. Pt endorses passive SI and AH. Says that the thoughts are always present in his mind. York Spaniel he would hang himself to end his life. Pt contracts for safety. States that he came to the hospital for help. Doesn't want to kill himself. Pt rates pain  10/10 as chronic pain in his neck that is related to tension, stress and anxiety. Pt has been given pain and anxiety medication prior to this writer's shift.  Pt rates anxiety  10/10 and depression  10/10. Pt attends groups, however, lots of noise and light bother him. Pt has been sitting in dayroom most of day. States that the tension has been building in his neck. He goes to his room to block out the light and constant noise that can be present in the dayroom. Pt also states that he is getting no relief from his auditory hallucinations. "They are just the same as when I first came here about 2 weeks ago." Pt encouraged to share this  information with the provider. No other concerns noted at this time.  A: Pt provided support and encouragement. Pt given scheduled medication as prescribed. PRNs as appropriate. Q15 min checks for safety.   R: Pt safe on the unit. Will continue to monitor.

## 2022-09-27 NOTE — Group Note (Signed)
[facility] LCSW Group Therapy Note    Group Date: 09/27/2022 Start Time: 1315 End Time: 1400  Type of Therapy and Topic:  Group Therapy:  Overcoming Obstacles  Participation Level:  [facility] PARTICIPATION LEVEL: None  Mood:  Description of Group:   In this group patients will be encouraged to explore what they see as obstacles to their own wellness and recovery. They will be guided to discuss their thoughts, feelings, and behaviors related to these obstacles. The group will process together ways to cope with barriers, with attention given to specific choices patients can make. Each patient will be challenged to identify changes they are motivated to make in order to overcome their obstacles. This group will be process-oriented, with patients participating in exploration of their own experiences as well as giving and receiving support and challenge from other group members.  Therapeutic Goals: 1. Patient will identify personal and current obstacles as they relate to admission. 2. Patient will identify barriers that currently interfere with their wellness or overcoming obstacles.  3. Patient will identify feelings, thought process and behaviors related to these barriers. 4. Patient will identify two changes they are willing to make to overcome these obstacles:    Summary of Patient Progress   Pt was late to group and did not make eye contact with facilitator or participants    Therapeutic Modalities:   Cognitive Behavioral Therapy Solution Focused Therapy Motivational Interviewing Relapse Prevention Therapy   [name], LCSWA

## 2022-09-27 NOTE — Plan of Care (Signed)
Patient lays in bed all day. He only comes out for meals. He does not participate in group.    Problem: Education: Goal: Knowledge of General Education information will improve Description: Including pain rating scale, medication(s)/side effects and non-pharmacologic comfort measures Outcome: Not Progressing   Problem: Health Behavior/Discharge Planning: Goal: Ability to manage health-related needs will improve Outcome: Not Progressing   Problem: Clinical Measurements: Goal: Ability to maintain clinical measurements within normal limits will improve Outcome: Not Progressing Goal: Will remain free from infection Outcome: Not Progressing Goal: Diagnostic test results will improve Outcome: Not Progressing Goal: Respiratory complications will improve Outcome: Not Progressing Goal: Cardiovascular complication will be avoided Outcome: Not Progressing   Problem: Activity: Goal: Risk for activity intolerance will decrease Outcome: Not Progressing   Problem: Nutrition: Goal: Adequate nutrition will be maintained Outcome: Not Progressing   Problem: Coping: Goal: Level of anxiety will decrease Outcome: Not Progressing   Problem: Elimination: Goal: Will not experience complications related to bowel motility Outcome: Not Progressing Goal: Will not experience complications related to urinary retention Outcome: Not Progressing   Problem: Pain Managment: Goal: General experience of comfort will improve Outcome: Not Progressing   Problem: Safety: Goal: Ability to remain free from injury will improve Outcome: Not Progressing   Problem: Skin Integrity: Goal: Risk for impaired skin integrity will decrease Outcome: Not Progressing

## 2022-09-28 DIAGNOSIS — F3130 Bipolar disorder, current episode depressed, mild or moderate severity, unspecified: Secondary | ICD-10-CM | POA: Diagnosis not present

## 2022-09-28 DIAGNOSIS — F4310 Post-traumatic stress disorder, unspecified: Secondary | ICD-10-CM | POA: Diagnosis not present

## 2022-09-28 MED ORDER — LORAZEPAM 2 MG/ML IJ SOLN: 2.0000 mg | Freq: Four times a day (QID) | INTRAMUSCULAR | Status: DC | PRN

## 2022-09-28 MED ORDER — HALOPERIDOL 5 MG PO TABS
5.0000 mg | ORAL_TABLET | Freq: Four times a day (QID) | ORAL | Status: DC | PRN
  Administered 2022-09-28 – 2022-10-04 (×6): 5 mg via ORAL
  Filled 2022-09-28 (×3): qty 1

## 2022-09-28 MED ORDER — DIPHENHYDRAMINE HCL 50 MG/ML IJ SOLN: 50.0000 mg | Freq: Four times a day (QID) | INTRAMUSCULAR | Status: DC | PRN

## 2022-09-28 MED ORDER — DIPHENHYDRAMINE HCL 25 MG PO CAPS
50.0000 mg | ORAL_CAPSULE | Freq: Four times a day (QID) | ORAL | Status: DC | PRN
  Administered 2022-09-28 – 2022-10-04 (×6): 50 mg via ORAL
  Filled 2022-09-28 (×6): qty 2

## 2022-09-28 MED ORDER — BENZTROPINE MESYLATE 1 MG PO TABS
1.0000 mg | ORAL_TABLET | Freq: Four times a day (QID) | ORAL | Status: DC | PRN
  Administered 2022-09-28 – 2022-10-06 (×6): 1 mg via ORAL
  Filled 2022-09-28 (×6): qty 1

## 2022-09-28 MED ORDER — LORAZEPAM 1 MG PO TABS
2.0000 mg | ORAL_TABLET | Freq: Four times a day (QID) | ORAL | Status: DC | PRN
  Administered 2022-09-28 – 2022-09-30 (×2): 2 mg via ORAL
  Filled 2022-09-28 (×2): qty 2

## 2022-09-28 NOTE — Progress Notes (Signed)
[facility] MD Progress Note  09/28/2022 [name]  MRN:  [id number]  Subjective: Case discussed with staff, chart reviewed, patient seen during rounds.  Per staff patient has been attending groups and more visible on the unit. He reported to staff last night that he has SI with a plan to hang self. Today during assessment the patient said " these thoughts come and go, I don't feel like this anymore". He continues to feel depressed.  Patient was encouraged to participate in milieu and attend groups.  Patient reports his sleep has improved.  He is eating better.  Patient was encouraged to work on coping strategies. Patient denied AVH today.   Principal Problem: Bipolar I disorder, most recent episode depressed (HCC) Diagnosis: Principal Problem:   Bipolar I disorder, most recent episode depressed (HCC) Active Problems:   PTSD (post-traumatic stress disorder)    Past Psychiatric History: Reports h/o Bipolar disorder, anxiety and PTSD. Reports  multiple psychiatric hospitalization. Denies past suicide attempts.   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 echo 5/17: EF 60-65%, no RWMA, LV dias fxn nl, LA mildly dilated, RV sys fxn nl, PASP nl, moderate sized circumferential pericardial effusion was identified, 2.12 cm around the LV free wall, <1 cm around the RV free wall. Features were not c/w tamponade physiology   PTSD (post-traumatic stress disorder)    Witnessed brother's suicide.   Restless leg syndrome    Seizures (HCC)    Syncope     Past Surgical History:  Procedure Laterality Date   CYSTOSCOPY WITH URETEROSCOPY AND STENT  PLACEMENT     ESOPHAGOGASTRODUODENOSCOPY N/A 01/11/2016   Procedure: ESOPHAGOGASTRODUODENOSCOPY (EGD);  Surgeon: Charlott Rakes, MD;  Location: [facility] ENDOSCOPY;  Service: Endoscopy;  Laterality: N/A;   ESOPHAGOGASTRODUODENOSCOPY N/A 04/09/2020   Procedure: ESOPHAGOGASTRODUODENOSCOPY (EGD);  Surgeon: [name], MD;  Location: [facility] ENDOSCOPY;  Service: Gastroenterology;  Laterality: N/A;   INCISION AND DRAINAGE ABSCESS Left 01/02/2018   Procedure: INCISION AND DRAINAGE LEFT KNEE;  Surgeon: Deeann Saint, MD;  Location: [facility] ORS;  Service: Orthopedics;  Laterality: Left;   JOINT REPLACEMENT Right    TKR   KNEE ARTHROSCOPY Right 06/25/2014   Procedure: ARTHROSCOPY KNEE;  Surgeon: Deeann Saint, MD;  Location: [facility] ORS;  Service: Orthopedics;  Laterality: Right;  partial arthroscopic medial menisectomy   LAPAROSCOPIC APPENDECTOMY N/A 06/02/2021   Procedure: APPENDECTOMY LAPAROSCOPIC;  Surgeon: Campbell Lerner, MD;  Location: [facility] ORS;  Service: General;  Laterality: N/A;   TOTAL KNEE ARTHROPLASTY Right 04/22/2015   Procedure: TOTAL KNEE ARTHROPLASTY;  Surgeon: Deeann Saint, MD;  Location: [facility] ORS;  Service: Orthopedics;  Laterality: Right;   TOTAL KNEE ARTHROPLASTY Left 10/30/2017   Procedure: TOTAL KNEE ARTHROPLASTY;  Surgeon: Deeann Saint, MD;  Location: [facility] ORS;  Service: Orthopedics;  Laterality: Left;   TOTAL KNEE REVISION Left 01/02/2018   Procedure: poly exchange of tibia and patella left knee;  Surgeon: Deeann Saint, MD;  Location: [facility] ORS;  Service: Orthopedics;  Laterality: Left;   UMBILICAL HERNIA REPAIR  06/02/2021   Procedure:  HERNIA REPAIR UMBILICAL ADULT;  Surgeon: Campbell Lerner, MD;  Location: [facility] ORS;  Service: General;;   Family History:  Family History  Problem Relation Age of Onset   CVA Mother        deceased at age 94   Depression Brother        Died by suicide at age 32    Social History:  Social History   Substance and Sexual Activity  Alcohol Use Not  Currently   Comment: rare     Social History   Substance and Sexual Activity  Drug Use Yes   Types: Marijuana, Cocaine    Social History   Socioeconomic History   Marital status: Single    Spouse name: Not on file   Number of children: Not on file   Years of education: Not on file   Highest education level: Not on file  Occupational History   Not on file  Tobacco Use   Smoking status: Former    Current packs/day: 0.00    Average packs/day: 0.8 packs/day for 20.0 years (15.0 ttl pk-yrs)    Types: Cigarettes    Start date: 05/16/1964    Quit date: 05/16/1984    Years since quitting: 38.3   Smokeless tobacco: Never  Vaping Use   Vaping status: Never Used  Substance and Sexual Activity   Alcohol use: Not Currently    Comment: rare   Drug use: Yes    Types: Marijuana, Cocaine   Sexual activity: Not on file  Other Topics Concern   Not on file  Social History Narrative   ** Merged History Encounter **       Social Determinants of Health   Financial Resource Strain: Not on file  Food Insecurity: Food Insecurity Present (09/15/2022)   Hunger Vital Sign    Worried About Running Out of Food in the Last Year: Sometimes true    Ran Out of Food in the Last Year: Sometimes true  Transportation Needs: Unmet Transportation Needs (09/15/2022)   PRAPARE - Administrator, Civil Service (Medical): Yes    Lack of Transportation (Non-Medical): Yes  Physical Activity: Not on file  Stress: Not on file  Social Connections: Not on file   Additional Social History:                         Sleep: Improved  Appetite:  Fair  Current Medications: Current Facility-Administered Medications  Medication Dose Route Frequency Provider Last Rate Last Admin   acetaminophen (TYLENOL) tablet 650 mg  650 mg Oral Q6H PRN Onuoha, Chinwendu V, NP   650 mg at 09/28/22 1840   alum & mag hydroxide-simeth (MAALOX/MYLANTA) 200-200-20 MG/5ML suspension 30 mL  30 mL Oral Q4H PRN Onuoha,  Chinwendu V, NP   30 mL at 09/28/22 1333   baclofen (LIORESAL) tablet 5 mg  5 mg Oral BID Lewanda Rife, MD   5 mg at 09/28/22 0950   buPROPion (WELLBUTRIN XL) 24 hr tablet 300 mg  300 mg Oral Daily Sarina Ill, DO   300 mg at 09/28/22 0950   busPIRone (BUSPAR) tablet 5 mg  5 mg Oral BID Lewanda Rife, MD   5 mg at 09/28/22 0950   divalproex (DEPAKOTE) DR tablet 500 mg  500 mg Oral BID Lewanda Rife, MD   500 mg at 09/28/22 0950   haloperidol (HALDOL) tablet 5 mg  5 mg Oral TID PRN Mancel Bale, NP  Or   haloperidol lactate (HALDOL) injection 5 mg  5 mg Intramuscular TID PRN Onuoha, Chinwendu V, NP       hydrOXYzine (ATARAX) tablet 25 mg  25 mg Oral TID PRN Onuoha, Chinwendu V, NP   25 mg at 09/28/22 1840   magnesium hydroxide (MILK OF MAGNESIA) suspension 30 mL  30 mL Oral Daily PRN Onuoha, Chinwendu V, NP       metoprolol succinate (TOPROL-XL) 24 hr tablet 50 mg  50 mg Oral Daily Lewanda Rife, MD   50 mg at 09/28/22 0950   mirtazapine (REMERON) tablet 15 mg  15 mg Oral QHS Lewanda Rife, MD   15 mg at 09/27/22 2111   QUEtiapine (SEROQUEL) tablet 200 mg  200 mg Oral QHS Sarina Ill, DO   200 mg at 09/27/22 2111   rOPINIRole (REQUIP) tablet 1 mg  1 mg Oral QHS Sarina Ill, DO   1 mg at 09/27/22 2111    Lab Results:  Results for orders placed or performed during the hospital encounter of 09/15/22 (from the past 48 hour(s))  Valproic acid level     Status: Abnormal   Collection Time: 09/27/22  8:13 AM  Result Value Ref Range   Valproic Acid Lvl 27 (L) 50.0 - 100.0 ug/mL    Comment: Performed at [facility], [address] [place], [place] [address]    Blood Alcohol level:  Lab Results  Component Value Date   [facility] <10 09/15/2022   ETH <10 07/05/2022    Metabolic Disorder Labs: Lab Results  Component Value Date   HGBA1C 5.5 04/16/2022   MPG 111 04/16/2022   MPG 105.41 01/18/2021   No results found for:  "PROLACTIN" Lab Results  Component Value Date   CHOL 184 04/16/2022   TRIG 133 04/16/2022   HDL 32 (L) 04/16/2022   CHOLHDL 5.8 04/16/2022   VLDL 27 04/16/2022   LDLCALC 125 (H) 04/16/2022   LDLCALC 128 (H) 01/18/2021       Musculoskeletal: Strength & Muscle Tone: within normal limits Gait & Station: normal Patient leans: N/A   Psychiatric Specialty Exam:   Presentation  General Appearance:  Appropriate for Environment   Eye Contact: Fair   Speech: Clear and Coherent   Speech Volume: Normal   Handedness: Right     Mood and Affect  Mood: " depressed"   Affect: Congruent; Constricted     Thought Processes: Coherent; Goal Directed   Descriptions of Associations:Intact   Orientation:Full (Time, Place and Person)   Thought Content:Abstract Reasoning; Logical; Rumination   History of Schizophrenia/Schizoaffective disorder:No   Duration of Psychotic Symptoms:N/A   Hallucinations:Hallucinations: None   Ideas of Reference:None   Suicidal Thoughts:Suicidal Thoughts: Denies    Homicidal Thoughts:Homicidal Thoughts: No     Sensorium  Memory: Immediate Fair; Recent Fair; Remote Fair   Judgment: Improving   Insight: Fair     Chartered certified accountant: Fair   Attention Span: Fair   Recall: Fair   Fund of Knowledge: Good   Language: Fair       Psychomotor Activity: Psychomotor Activity: Decreased     Assets: Communication Skills; Financial Resources/Insurance     Sleep: Sleep:Improved     Physical Exam Constitutional:      Appearance: Normal appearance.  HENT:     Head: Normocephalic and atraumatic.     Nose: Nose normal.  Eyes:     Pupils: Pupils are equal, round, and reactive to light.  Cardiovascular:  Rate and Rhythm: Normal rate.  Pulmonary:     Effort: Pulmonary effort is normal.  Skin:    General: Skin is warm.  Neurological:     General: No focal deficit present.     Mental Status: He is  alert and oriented to person, place, and time.      Review of Systems  HENT:  Negative for congestion and hearing loss.   Eyes:  Negative for blurred vision and double vision.  Respiratory:  Negative for cough and shortness of breath.   Cardiovascular:  Negative for chest pain and palpitations.  Gastrointestinal:  Negative for heartburn, nausea and vomiting.  Neurological:  Negative for dizziness and speech change.    Blood pressure (!) 127/104, pulse 82, temperature 98.2 F (36.8 C), resp. rate 18, height 5\' 9"  (1.753 m), weight 79.8 kg, SpO2 97%. Body mass index is 25.99 kg/m.   Treatment Plan Summary: Daily contact with patient to assess and evaluate symptoms and progress in treatment and Medication management  Patient is admitted to locked unit under safety precautions Continue Depakote 500 mg by mouth twice daily for seizures and to target mood symptoms Increase the dose of Remeron to 15 mg at bedtime to help with depression, 09/17/2022 Continue home medicine for hypertension, metoprolol XL 50 mg daily Continue on BuSpar 5 mg by mouth twice daily for anxiety Patient was started on Seroquel on 09/22/2022 will continue on Seroquel  Continue to encourage sobriety Patient was encouraged to attend group and work on coping strategies Will recommend individual therapy upon discharge Baclofen 5 mg po BID to help muscle tension  Patient was started on Wellbutrin past week. Patient encouraged to monitor for manic symptoms  Lewanda Rife, MD

## 2022-09-28 NOTE — Progress Notes (Signed)
Pt at nurse's station. States that he has had no relief from the previous PRNs. "My neck is still tense and stiff and my anxiety is not getting any better." Pt given further PRNs as appropriate. Will continue to monitor.

## 2022-09-28 NOTE — Progress Notes (Signed)
Pt given PRNs as appropriate for anxiety and agitation. Will continue to monitor for effect.
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
## 2022-09-28 NOTE — Progress Notes (Signed)
   09/28/22 2000  Psych Admission Type (Psych Patients Only)  Admission Status Voluntary  Psychosocial Assessment  Patient Complaints Anxiety;Irritability;Depression;Self-harm thoughts;Tension  Eye Contact Glaring  Facial Expression Anxious;Animated  Affect Anxious  Speech Logical/coherent  Interaction Assertive  Motor Activity Slow  Appearance/Hygiene In scrubs  Behavior Characteristics Cooperative;Anxious;Agitated;Restless  Mood Irritable;Anxious;Depressed  Thought Process  Coherency WDL  Content Blaming others;Blaming self  Delusions None reported or observed  Perception Hallucinations  Hallucination Auditory;Command  Judgment WDL  Confusion None  Danger to Self  Current suicidal ideation? Passive  Description of Suicide Plan self-harm thoughts d/t anxiety and agitation  Self-Injurious Behavior Some self-injurious ideation observed or expressed.  No lethal plan expressed   Agreement Not to Harm Self Yes  Description of Agreement verbal  Danger to Others  Danger to Others None reported or observed   Progress note   D: Pt seen in dayroom. Pt denies HI, VH. Pt is anxious and irritable about interaction with staff during the day. Pt endorses passive SI. Contracts for safety. Says that command AH have been prominent this evening. Pt rates pain  10/10 as tension in his neck. Pt rates anxiety  10/10 and depression  10/10. Given anxiety and pain medicine before shift change. Pt endorses PTSD symptoms. Pt says medications are not helping his anxiety or the nightmares he is having lately of the deaths of his brothers and best friend. Encouraged pt to ask questions about ECT treatments that provider posed to him yesterday.   A: Pt provided support and encouragement. Pt given scheduled medication as prescribed. PRNs as appropriate. Q15 min checks for safety.   R: Pt safe on the unit. Will continue to monitor.

## 2022-09-28 NOTE — Plan of Care (Signed)
Problem: Health Behavior/Discharge Planning: Goal: Ability to manage health-related needs will improve Outcome: Progressing   Problem: Clinical Measurements: Goal: Ability to maintain clinical measurements within normal limits will improve Outcome: Progressing Goal: Will remain free from infection Outcome: Progressing Goal: Diagnostic test results will improve Outcome: Progressing Goal: Respiratory complications will improve Outcome: Progressing Goal: Cardiovascular complication will be avoided Outcome: Progressing   Problem: Activity: Goal: Risk for activity intolerance will decrease Outcome: Progressing   Problem: Nutrition: Goal: Adequate nutrition will be maintained Outcome: Progressing   Problem: Safety: Goal: Ability to remain free from injury will improve Outcome: Progressing

## 2022-09-28 NOTE — Group Note (Signed)
Recreation Therapy Group Note   Group Topic:Communication  Group Date: 09/28/2022 Start Time: 1400 End Time: 1445 Facilitators: Rosina Lowenstein, LRT, CTRS Location: Courtyard   Group Description: Emotional Check in. Patient sat and talked with LRT about how they are doing and whatever else is on their mind. LRT provided active listening, reassurance and encouragement. Pts were given the opportunity to listen to music or play cornhole while getting fresh air and sunlight in the courtyard.    Goal Area(s) Addressed: Patient will engage in conversation with LRT. Patient will communicate their wants, needs, or questions.  Patient will practice a new coping skill of "talking to someone".   Affect/Mood: N/A   Participation Level: Did not attend    Clinical Observations/Individualized Feedback: Gunner did not attend group.  Plan: Continue to engage patient in RT group sessions 2-3x/week.   Rosina Lowenstein, LRT, CTRS 09/28/2022 3:00 PM

## 2022-09-28 NOTE — Group Note (Signed)
Date:  10/04/2022 Time:  10:46 AM  Group Topic/Focus:  Goals Group:The focus of this group is to help patients establish short term and long term goals.Creating a realistic plan that the patients can accomplish when they are discharged.    Participation Level:  Active  Participation Quality:  Appropriate  Affect:  Appropriate  Cognitive:  Alert and Appropriate  Insight: Appropriate  Engagement in Group:  Engaged  Modes of Intervention:  Activity  Additional Comments:    Marta Antu 10/04/2022, 10:46 AM

## 2022-09-28 NOTE — Progress Notes (Signed)
   09/28/22 1400  Psych Admission Type (Psych Patients Only)  Admission Status Voluntary  Psychosocial Assessment  Patient Complaints Irritability;Anxiety  Eye Contact Fair  Facial Expression Anxious  Affect Anxious  Speech Logical/coherent  Interaction Assertive  Motor Activity Slow  Appearance/Hygiene In scrubs  Behavior Characteristics Cooperative;Anxious;Irritable  Mood Irritable;Anxious  Thought Process  Coherency WDL  Content WDL  Delusions None reported or observed  Perception Hallucinations  Hallucination Auditory;Visual  Judgment WDL  Confusion None  Danger to Self  Current suicidal ideation? Passive;Denies  Danger to Others  Danger to Others None reported or observed   Refused to attend group. Multiple redirection to attend group but refused. Engaged in multiple conversations with peer and staff. Tolerated all medications and meals. Endorses anxiety but denies SI/HI/AVH.

## 2022-09-28 NOTE — Group Note (Signed)
Date:  09/28/2022 Time:  10:41 PM  Group Topic/Focus:  Crisis Planning:   The purpose of this group is to help patients create a crisis plan for use upon discharge or in the future, as needed.    Participation Level:  Active  Participation Quality:  Appropriate  Affect:  Appropriate  Cognitive:  Appropriate  Insight: Appropriate  Engagement in Group:  Developing/Improving and Engaged  Modes of Intervention:  Education  Additional Comments:    Garry Heater 09/28/2022, 10:41 PM

## 2022-09-29 DIAGNOSIS — F4310 Post-traumatic stress disorder, unspecified: Secondary | ICD-10-CM

## 2022-09-29 DIAGNOSIS — F3130 Bipolar disorder, current episode depressed, mild or moderate severity, unspecified: Secondary | ICD-10-CM | POA: Diagnosis not present

## 2022-09-29 NOTE — Group Note (Signed)
Date:  09/29/2022 Time:  8:56 PM  Group Topic/Focus:  Healthy Communication:   The focus of this group is to discuss communication, barriers to communication, as well as healthy ways to communicate with others.    Participation Level:  Active  Participation Quality:  Appropriate  Affect:  Appropriate  Cognitive:  Appropriate  Insight: Appropriate  Engagement in Group:  Engaged  Modes of Intervention:  Discussion  Additional Comments:    Burt Ek 09/29/2022, 8:56 PM

## 2022-09-29 NOTE — Progress Notes (Signed)
   09/29/22 0600  15 Minute Checks  Location Bedroom  Visual Appearance Calm  Behavior Sleeping  Sleep (Behavioral Health Patients Only)  Calculate sleep? (Click Yes once per 24 hr at 0600 safety check) Yes  Documented sleep last 24 hours 7.75

## 2022-09-29 NOTE — Progress Notes (Signed)
[facility] MD Progress Note  09/29/2022 [name]  MRN:  [id number]  Subjective: Case discussed with staff, chart reviewed, patient seen during rounds.  Per staff patient has been attending groups and more visible on the unit.  He continues to feel depressed.  Patient reports his sleep has improved.  He is eating better.  Patient has been helpful.  Patient reports fleeting suicidal thoughts, and on and off auditory hallucinations.  He agrees to increase the dose of Seroquel to 300 mg at bedtime.  Patient also talked about PTSD.  Patient was provided with support.  He was encouraged to be in individual therapy upon discharge to process emotions and symptoms around trauma.  He denies any intention or plan to harm himself on the unit.  Principal Problem: Bipolar I disorder, most recent episode depressed (HCC) Diagnosis: Principal Problem:   Bipolar I disorder, most recent episode depressed (HCC) Active Problems:   PTSD (post-traumatic stress disorder)    Past Psychiatric History: Reports h/o Bipolar disorder, anxiety and PTSD. Reports  multiple psychiatric hospitalization. Denies past suicide attempts.   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 echo 5/17: EF 60-65%, no RWMA, LV dias fxn nl, LA mildly dilated, RV sys fxn nl, PASP nl, moderate sized circumferential pericardial effusion was identified, 2.12 cm around the LV free wall, <1 cm around the RV free wall. Features were not c/w tamponade physiology   PTSD (post-traumatic stress disorder)    Witnessed brother's suicide.   Restless leg syndrome    Seizures (HCC)    Syncope      Past Surgical History:  Procedure Laterality Date   CYSTOSCOPY WITH URETEROSCOPY AND STENT PLACEMENT     ESOPHAGOGASTRODUODENOSCOPY N/A 01/11/2016   Procedure: ESOPHAGOGASTRODUODENOSCOPY (EGD);  Surgeon: Charlott Rakes, MD;  Location: [facility] ENDOSCOPY;  Service: Endoscopy;  Laterality: N/A;   ESOPHAGOGASTRODUODENOSCOPY N/A 04/09/2020   Procedure: ESOPHAGOGASTRODUODENOSCOPY (EGD);  Surgeon: [name], MD;  Location: [facility] ENDOSCOPY;  Service: Gastroenterology;  Laterality: N/A;   INCISION AND DRAINAGE ABSCESS Left 01/02/2018   Procedure: INCISION AND DRAINAGE LEFT KNEE;  Surgeon: Deeann Saint, MD;  Location: [facility] ORS;  Service: Orthopedics;  Laterality: Left;   JOINT REPLACEMENT Right    TKR   KNEE ARTHROSCOPY Right 06/25/2014   Procedure: ARTHROSCOPY KNEE;  Surgeon: Deeann Saint, MD;  Location: [facility] ORS;  Service: Orthopedics;  Laterality: Right;  partial arthroscopic medial menisectomy   LAPAROSCOPIC APPENDECTOMY N/A 06/02/2021   Procedure: APPENDECTOMY LAPAROSCOPIC;  Surgeon: Campbell Lerner, MD;  Location: [facility] ORS;  Service: General;  Laterality: N/A;   TOTAL KNEE ARTHROPLASTY Right 04/22/2015   Procedure: TOTAL KNEE ARTHROPLASTY;  Surgeon: Deeann Saint, MD;  Location: [facility] ORS;  Service: Orthopedics;  Laterality: Right;   TOTAL KNEE ARTHROPLASTY Left 10/30/2017   Procedure: TOTAL KNEE ARTHROPLASTY;  Surgeon: Deeann Saint, MD;  Location: [facility] ORS;  Service: Orthopedics;  Laterality: Left;   TOTAL KNEE REVISION Left 01/02/2018   Procedure: poly exchange of tibia and patella left knee;  Surgeon: Deeann Saint, MD;  Location: [facility] ORS;  Service: Orthopedics;  Laterality: Left;   UMBILICAL HERNIA REPAIR  06/02/2021   Procedure: HERNIA REPAIR UMBILICAL ADULT;  Surgeon: Campbell Lerner, MD;  Location: [facility] ORS;  Service: General;;   Family History:  Family History  Problem Relation Age of Onset   CVA Mother        deceased at age 60   Depression Brother        Died by suicide at age  43    Social History:  Social History   Substance and Sexual Activity  Alcohol Use Not Currently   Comment: rare     Social History   Substance and Sexual Activity  Drug Use Yes   Types: Marijuana, Cocaine    Social History   Socioeconomic History   Marital status: Single    Spouse name: Not on file   Number of children: Not on file   Years of education: Not on file   Highest education level: Not on file  Occupational History   Not on file  Tobacco Use   Smoking status: Former    Current packs/day: 0.00    Average packs/day: 0.8 packs/day for 20.0 years (15.0 ttl pk-yrs)    Types: Cigarettes    Start date: 05/16/1964    Quit date: 05/16/1984    Years since quitting: 38.3   Smokeless tobacco: Never  Vaping Use   Vaping status: Never Used  Substance and Sexual Activity   Alcohol use: Not Currently    Comment: rare   Drug use: Yes    Types: Marijuana, Cocaine   Sexual activity: Not on file  Other Topics Concern   Not on file  Social History Narrative   ** Merged History Encounter **       Social Determinants of Health   Financial Resource Strain: Not on file  Food Insecurity: Food Insecurity Present (09/15/2022)   Hunger Vital Sign    Worried About Running Out of Food in the Last Year: Sometimes true    Ran Out of Food in the Last Year: Sometimes true  Transportation Needs: Unmet Transportation Needs (09/15/2022)   PRAPARE - Administrator, Civil Service (Medical): Yes    Lack of Transportation (Non-Medical): Yes  Physical Activity: Not on file  Stress: Not on file  Social Connections: Not on file   Additional Social History:                         Sleep: Improved  Appetite:  Fair  Current Medications: Current Facility-Administered Medications  Medication Dose Route Frequency Provider Last Rate Last Admin   acetaminophen (TYLENOL) tablet 650 mg  650 mg Oral Q6H PRN Onuoha, Chinwendu V, NP   650 mg at 09/29/22 1702   alum & mag  hydroxide-simeth (MAALOX/MYLANTA) 200-200-20 MG/5ML suspension 30 mL  30 mL Oral Q4H PRN Onuoha, Chinwendu V, NP   30 mL at 09/28/22 1333   baclofen (LIORESAL) tablet 5 mg  5 mg Oral BID Lewanda Rife, MD   5 mg at 09/29/22 0901   haloperidol (HALDOL) tablet 5 mg  5 mg Oral Q6H PRN Jearld Lesch, NP   5 mg at 09/28/22 2144   And   benztropine (COGENTIN) tablet 1 mg  1 mg Oral Q6H PRN Jearld Lesch, NP   1 mg at 09/28/22 2144   buPROPion (WELLBUTRIN XL) 24 hr tablet 300 mg  300 mg Oral Daily Sarina Ill, DO   300 mg at 09/29/22  0900   busPIRone (BUSPAR) tablet 5 mg  5 mg Oral BID Lewanda Rife, MD   5 mg at 09/29/22 0900   diphenhydrAMINE (BENADRYL) capsule 50 mg  50 mg Oral Q6H PRN Jearld Lesch, NP   50 mg at 09/28/22 2252   Or   diphenhydrAMINE (BENADRYL) injection 50 mg  50 mg Intramuscular Q6H PRN Jearld Lesch, NP       divalproex (DEPAKOTE) DR tablet 500 mg  500 mg Oral BID Lewanda Rife, MD   500 mg at 09/29/22 0900   haloperidol (HALDOL) tablet 5 mg  5 mg Oral TID PRN Onuoha, Chinwendu V, NP       Or   haloperidol lactate (HALDOL) injection 5 mg  5 mg Intramuscular TID PRN Onuoha, Chinwendu V, NP       hydrOXYzine (ATARAX) tablet 25 mg  25 mg Oral TID PRN Onuoha, Chinwendu V, NP   25 mg at 09/29/22 1702   LORazepam (ATIVAN) tablet 2 mg  2 mg Oral Q6H PRN Jearld Lesch, NP   2 mg at 09/28/22 2252   Or   LORazepam (ATIVAN) injection 2 mg  2 mg Intramuscular Q6H PRN Jearld Lesch, NP       magnesium hydroxide (MILK OF MAGNESIA) suspension 30 mL  30 mL Oral Daily PRN Onuoha, Chinwendu V, NP       metoprolol succinate (TOPROL-XL) 24 hr tablet 50 mg  50 mg Oral Daily Lewanda Rife, MD   50 mg at 09/28/22 0950   mirtazapine (REMERON) tablet 15 mg  15 mg Oral QHS Lewanda Rife, MD   15 mg at 09/28/22 2147   QUEtiapine (SEROQUEL) tablet 200 mg  200 mg Oral QHS Sarina Ill, DO   200 mg at 09/28/22 2143   rOPINIRole (REQUIP) tablet 1 mg   1 mg Oral QHS Sarina Ill, DO   1 mg at 09/28/22 2143    Lab Results:  No results found for this or any previous visit (from the past 48 hour(s)).   Blood Alcohol level:  Lab Results  Component Value Date   ETH <10 09/15/2022   ETH <10 07/05/2022    Metabolic Disorder Labs: Lab Results  Component Value Date   HGBA1C 5.5 04/16/2022   MPG 111 04/16/2022   MPG 105.41 01/18/2021   No results found for: "PROLACTIN" Lab Results  Component Value Date   CHOL 184 04/16/2022   TRIG 133 04/16/2022   HDL 32 (L) 04/16/2022   CHOLHDL 5.8 04/16/2022   VLDL 27 04/16/2022   LDLCALC 125 (H) 04/16/2022   LDLCALC 128 (H) 01/18/2021       Musculoskeletal: Strength & Muscle Tone: within normal limits Gait & Station: normal Patient leans: N/A   Psychiatric Specialty Exam:   Presentation  General Appearance:  Appropriate for Environment   Eye Contact: Fair   Speech: Clear and Coherent   Speech Volume: Normal   Handedness: Right     Mood and Affect  Mood: " depressed"   Affect: Congruent; Constricted     Thought Processes: Coherent; Goal Directed   Descriptions of Associations:Intact   Orientation:Full (Time, Place and Person)   Thought Content:Abstract Reasoning; Logical; Rumination   History of Schizophrenia/Schizoaffective disorder:No   Duration of Psychotic Symptoms:N/A   Hallucinations:Hallucinations: Off and on towards PM   Ideas of Reference:None   Suicidal Thoughts:Suicidal Thoughts: Off and on    Homicidal Thoughts:Homicidal Thoughts: No     Sensorium  Memory: Immediate Fair; Recent  Fair; Remote Fair   Judgment: Improving   Insight: Fair     Chartered certified accountant: Fair   Attention Span: Fair   Recall: [employer] of Knowledge: Good   Language: Fair       Psychomotor Activity: Psychomotor Activity: Decreased     Assets: Communication Skills; Financial Resources/Insurance      Sleep: Sleep:Improved     Physical Exam Constitutional:      Appearance: Normal appearance.  HENT:     Head: Normocephalic and atraumatic.     Nose: Nose normal.  Eyes:     Pupils: Pupils are equal, round, and reactive to light.  Cardiovascular:     Rate and Rhythm: Normal rate.  Pulmonary:     Effort: Pulmonary effort is normal.  Skin:    General: Skin is warm.  Neurological:     General: No focal deficit present.     Mental Status: He is alert and oriented to person, place, and time.      Review of Systems  HENT:  Negative for congestion and hearing loss.   Eyes:  Negative for blurred vision and double vision.  Respiratory:  Negative for cough and shortness of breath.   Cardiovascular:  Negative for chest pain and palpitations.  Gastrointestinal:  Negative for heartburn, nausea and vomiting.  Neurological:  Negative for dizziness and speech change.    Blood pressure (!) 108/91, pulse 78, temperature 98.7 F (37.1 C), resp. rate 18, height 5\' 9"  (1.753 m), weight 79.8 kg, SpO2 99%. Body mass index is 25.99 kg/m.   Treatment Plan Summary: Daily contact with patient to assess and evaluate symptoms and progress in treatment and Medication management  Patient is admitted to locked unit under safety precautions Continue Depakote 500 mg by mouth twice daily for seizures and to target mood symptoms Increase the dose of Remeron to 15 mg at bedtime to help with depression, 09/17/2022 Continue home medicine for hypertension, metoprolol XL 50 mg daily Continue on BuSpar 5 mg by mouth twice daily for anxiety Will increase the dose of Seroquel to 300 mg at bedtime, 09/29/22  Continue to encourage sobriety Patient was encouraged to attend group and work on coping strategies Will recommend individual therapy upon discharge Baclofen 5 mg po BID to help muscle tension  Patient was started on Wellbutrin past week. Patient encouraged to monitor for manic symptoms  Lewanda Rife,  MD

## 2022-09-29 NOTE — Progress Notes (Signed)
Patient is a voluntary admission to [facility] for Bipolar, Depression, PTSD. He is calm, cooperative and pleasant with staff and peers. He does stay in his room a lot, but comes out for meals and group activities. States he sometimes thinks of SI but wouldn't do it, denies HI, states he sometimes hears things that aren't there. Endorses depression and anxiety.  Was medicated for anxiety today. Will continue to monitor.

## 2022-09-29 NOTE — Plan of Care (Signed)
Problem: Clinical Measurements: Goal: Ability to maintain clinical measurements within normal limits will improve Outcome: Progressing Goal: Will remain free from infection Outcome: Progressing   Problem: Activity: Goal: Risk for activity intolerance will decrease Outcome: Progressing   Problem: Nutrition: Goal: Adequate nutrition will be maintained Outcome: Progressing

## 2022-09-29 NOTE — Progress Notes (Signed)
Patient asked for and received tylenol with his atarax as well as his morning meds that included baclofen. States no change with his pain.

## 2022-09-29 NOTE — Group Note (Signed)
Date:  09/29/2022 Time:  11:59 AM  Group Topic/Focus:  Managing Feelings:   The focus of this group is to identify what feelings patients have difficulty handling and develop a plan to handle them in a healthier way upon discharge.    Participation Level:  Active  Participation Quality:  Appropriate, Attentive, Sharing, and Supportive  Affect:  Appropriate  Cognitive:  Alert, Appropriate, and Oriented  Insight: Appropriate, Good, and Improving  Engagement in Group:  Engaged and Supportive  Modes of Intervention:  Activity, Clarification, and Socialization  Additional Comments:     Alexis Frock 09/29/2022, 11:59 AM

## 2022-09-30 DIAGNOSIS — F4310 Post-traumatic stress disorder, unspecified: Secondary | ICD-10-CM | POA: Diagnosis not present

## 2022-09-30 DIAGNOSIS — F3130 Bipolar disorder, current episode depressed, mild or moderate severity, unspecified: Secondary | ICD-10-CM | POA: Diagnosis not present

## 2022-09-30 MED ORDER — QUETIAPINE FUMARATE 200 MG PO TABS
300.0000 mg | ORAL_TABLET | Freq: Every day | ORAL | Status: DC
  Administered 2022-09-30: 300 mg via ORAL
  Filled 2022-09-30: qty 1

## 2022-09-30 NOTE — Progress Notes (Signed)
Patient rounded on by this RN, patient states, "I can feel the medication working, and I feel less anxious, so I am going to try to lay down and see what happens." Patient walked to room. Patient able to contract for safety. Q15 mins safety rounds continued.

## 2022-09-30 NOTE — Group Note (Signed)
Date:  09/30/2022 Time:  8:26 PM  Group Topic/Focus:  Managing Feelings:   The focus of this group is to identify what feelings patients have difficulty handling and develop a plan to handle them in a healthier way upon discharge.    Participation Level:  Active  Participation Quality:  Appropriate  Affect:  Appropriate  Cognitive:  Appropriate  Insight: Appropriate  Engagement in Group:  Engaged  Modes of Intervention:  Discussion  Additional Comments:    Burt Ek 09/30/2022, 8:26 PM

## 2022-09-30 NOTE — Progress Notes (Signed)
Patient admitted Sept 5, 2024 for complaints of poor sleep, poor appetite, and SI with plan to hang himself.  He currently endorses passive SI but agrees to contract for safety. Says anxiety is 10/10. Hydroxyzine adm PRN x 2. Upon follow up, both doses were effective. Tylenol 650 mg adm PRN for 10/10 neck pain. Upon follow up, pain decreased to a 5.  Q15 minute unit checks in place.

## 2022-09-30 NOTE — Progress Notes (Signed)
   09/30/22 2215  Psych Admission Type (Psych Patients Only)  Admission Status Voluntary  Psychosocial Assessment  Patient Complaints Anxiety;Nervousness;Other (Comment) ("i am having flash backs again")  Eye Contact Brief  Facial Expression Worried;Sad  Affect Anxious;Depressed  Speech Pressured;Rapid  Interaction Assertive  Motor Activity Slow  Appearance/Hygiene In scrubs  Behavior Characteristics Cooperative;Appropriate to situation;Anxious  Mood Anxious;Sad;Preoccupied  Thought Process  Coherency WDL  Content WDL  Delusions Persecutory  Perception Hallucinations  Hallucination Auditory;Command  Judgment WDL  Confusion None  Danger to Self  Current suicidal ideation? Passive  Self-Injurious Behavior No self-injurious ideation or behavior indicators observed or expressed   Agreement Not to Harm Self Yes  Description of Agreement verbal  Danger to Others  Danger to Others None reported or observed

## 2022-09-30 NOTE — Plan of Care (Signed)

## 2022-09-30 NOTE — Progress Notes (Signed)
[facility] MD Progress Note  09/30/2022 [name]  MRN:  [id number]  Subjective: Case discussed with staff, chart reviewed, patient seen during rounds.  Patient today reports he feels somewhat better.  He has been attending groups and working on coping strategies, patient said that sometimes he gets agitated if it is too loud in the area.  Overall he feels better.  He reports voices are quieter.  When asked about suicidal thoughts patient reports" I always have those thoughts, but I have no intention or plan to act on those thoughts".  Patient continues to talk about PTSD.  Patient was provided with support.  He was encouraged to be in individual therapy upon discharge to process emotions and symptoms around trauma.  He denies any intention or plan to harm himself on the unit.  Principal Problem: Bipolar I disorder, most recent episode depressed (HCC) Diagnosis: Principal Problem:   Bipolar I disorder, most recent episode depressed (HCC) Active Problems:   PTSD (post-traumatic stress disorder)    Past Psychiatric History: Reports h/o Bipolar disorder, anxiety and PTSD. Reports  multiple psychiatric hospitalization. Denies past suicide attempts.   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 echo 5/17: EF 60-65%, no RWMA, LV dias fxn nl, LA mildly dilated, RV sys fxn nl, PASP nl, moderate sized circumferential pericardial effusion was identified, 2.12 cm around the LV free wall, <1 cm around the RV free wall. Features were not c/w tamponade physiology   PTSD (post-traumatic stress disorder)    Witnessed brother's suicide.   Restless leg  syndrome    Seizures (HCC)    Syncope     Past Surgical History:  Procedure Laterality Date   CYSTOSCOPY WITH URETEROSCOPY AND STENT PLACEMENT     ESOPHAGOGASTRODUODENOSCOPY N/A 01/11/2016   Procedure: ESOPHAGOGASTRODUODENOSCOPY (EGD);  Surgeon: Charlott Rakes, MD;  Location: [facility] ENDOSCOPY;  Service: Endoscopy;  Laterality: N/A;   ESOPHAGOGASTRODUODENOSCOPY N/A 04/09/2020   Procedure: ESOPHAGOGASTRODUODENOSCOPY (EGD);  Surgeon: [name], MD;  Location: [facility] ENDOSCOPY;  Service: Gastroenterology;  Laterality: N/A;   INCISION AND DRAINAGE ABSCESS Left 01/02/2018   Procedure: INCISION AND DRAINAGE LEFT KNEE;  Surgeon: Deeann Saint, MD;  Location: [facility] ORS;  Service: Orthopedics;  Laterality: Left;   JOINT REPLACEMENT Right    TKR   KNEE ARTHROSCOPY Right 06/25/2014   Procedure: ARTHROSCOPY KNEE;  Surgeon: Deeann Saint, MD;  Location: [facility] ORS;  Service: Orthopedics;  Laterality: Right;  partial arthroscopic medial menisectomy   LAPAROSCOPIC APPENDECTOMY N/A 06/02/2021   Procedure: APPENDECTOMY LAPAROSCOPIC;  Surgeon: Campbell Lerner, MD;  Location: [facility] ORS;  Service: General;  Laterality: N/A;   TOTAL KNEE ARTHROPLASTY Right 04/22/2015   Procedure: TOTAL KNEE ARTHROPLASTY;  Surgeon: Deeann Saint, MD;  Location: [facility] ORS;  Service: Orthopedics;  Laterality: Right;   TOTAL KNEE ARTHROPLASTY Left 10/30/2017   Procedure: TOTAL KNEE ARTHROPLASTY;  Surgeon: Deeann Saint, MD;  Location: [facility] ORS;  Service: Orthopedics;  Laterality: Left;   TOTAL KNEE REVISION Left 01/02/2018   Procedure: poly exchange of tibia and patella left knee;  Surgeon: Deeann Saint, MD;  Location: [facility] ORS;  Service: Orthopedics;  Laterality: Left;   UMBILICAL HERNIA REPAIR  06/02/2021   Procedure: HERNIA REPAIR UMBILICAL ADULT;  Surgeon: Campbell Lerner, MD;  Location: [facility] ORS;  Service: General;;   Family History:  Family History  Problem Relation Age of Onset   CVA Mother        deceased at age 56    Depression Brother        Died by suicide at age 72    Social History:  Social History   Substance and Sexual Activity  Alcohol Use Not Currently   Comment: rare     Social History   Substance and Sexual Activity  Drug Use Yes   Types: Marijuana, Cocaine    Social History   Socioeconomic History   Marital status: Single    Spouse name: Not on file   Number of children: Not on file   Years of education: Not on file   Highest education level: Not on file  Occupational History   Not on file  Tobacco Use   Smoking status: Former    Current packs/day: 0.00    Average packs/day: 0.8 packs/day for 20.0 years (15.0 ttl pk-yrs)    Types: Cigarettes    Start date: 05/16/1964    Quit date: 05/16/1984    Years since quitting: 38.4   Smokeless tobacco: Never  Vaping Use   Vaping status: Never Used  Substance and Sexual Activity   Alcohol use: Not Currently    Comment: rare   Drug use: Yes    Types: Marijuana, Cocaine   Sexual activity: Not on file  Other Topics Concern   Not on file  Social History Narrative   ** Merged History Encounter **       Social Determinants of Health   Financial Resource Strain: Not on file  Food Insecurity: Food Insecurity Present (09/15/2022)   Hunger Vital Sign    Worried About Running Out of Food in the Last Year: Sometimes true    Ran Out of Food in the Last Year: Sometimes true  Transportation Needs: Unmet Transportation Needs (09/15/2022)   PRAPARE - Administrator, Civil Service (Medical): Yes    Lack of Transportation (Non-Medical): Yes  Physical Activity: Not on file  Stress: Not on file  Social Connections: Not on file   Additional Social History:                         Sleep: Improved  Appetite:  Fair  Current Medications: Current Facility-Administered Medications  Medication Dose Route Frequency Provider Last Rate Last Admin   acetaminophen (TYLENOL) tablet 650 mg  650 mg Oral Q6H PRN Onuoha, Chinwendu  V, NP   650 mg at 09/30/22 1007   alum & mag hydroxide-simeth (MAALOX/MYLANTA) 200-200-20 MG/5ML suspension 30 mL  30 mL Oral Q4H PRN Onuoha, Chinwendu V, NP   30 mL at 09/28/22 1333   baclofen (LIORESAL) tablet 5 mg  5 mg Oral BID Lewanda Rife, MD   5 mg at 09/30/22 1006   haloperidol (HALDOL) tablet 5 mg  5 mg Oral Q6H PRN Jearld Lesch, NP   5 mg at 09/28/22 2144   And   benztropine (COGENTIN) tablet 1 mg  1 mg Oral Q6H PRN Jearld Lesch, NP   1 mg at 09/28/22 2144   buPROPion (WELLBUTRIN XL) 24 hr tablet 300 mg  300 mg Oral Daily Marlou Porch,  Lanny Cramp, DO   300 mg at 09/30/22 1007   busPIRone (BUSPAR) tablet 5 mg  5 mg Oral BID Lewanda Rife, MD   5 mg at 09/30/22 1007   diphenhydrAMINE (BENADRYL) capsule 50 mg  50 mg Oral Q6H PRN Jearld Lesch, NP   50 mg at 09/28/22 2252   Or   diphenhydrAMINE (BENADRYL) injection 50 mg  50 mg Intramuscular Q6H PRN Jearld Lesch, NP       divalproex (DEPAKOTE) DR tablet 500 mg  500 mg Oral BID Lewanda Rife, MD   500 mg at 09/30/22 1006   haloperidol (HALDOL) tablet 5 mg  5 mg Oral TID PRN Onuoha, Chinwendu V, NP       Or   haloperidol lactate (HALDOL) injection 5 mg  5 mg Intramuscular TID PRN Onuoha, Chinwendu V, NP       hydrOXYzine (ATARAX) tablet 25 mg  25 mg Oral TID PRN Onuoha, Chinwendu V, NP   25 mg at 09/30/22 1007   LORazepam (ATIVAN) tablet 2 mg  2 mg Oral Q6H PRN Jearld Lesch, NP   2 mg at 09/28/22 2252   Or   LORazepam (ATIVAN) injection 2 mg  2 mg Intramuscular Q6H PRN Jearld Lesch, NP       magnesium hydroxide (MILK OF MAGNESIA) suspension 30 mL  30 mL Oral Daily PRN Onuoha, Chinwendu V, NP       metoprolol succinate (TOPROL-XL) 24 hr tablet 50 mg  50 mg Oral Daily Lewanda Rife, MD   50 mg at 09/30/22 1007   mirtazapine (REMERON) tablet 15 mg  15 mg Oral QHS Lewanda Rife, MD   15 mg at 09/29/22 2131   QUEtiapine (SEROQUEL) tablet 300 mg  300 mg Oral QHS Lewanda Rife, MD       rOPINIRole  (REQUIP) tablet 1 mg  1 mg Oral QHS Sarina Ill, DO   1 mg at 09/29/22 2130    Lab Results:  No results found for this or any previous visit (from the past 48 hour(s)).   Blood Alcohol level:  Lab Results  Component Value Date   ETH <10 09/15/2022   ETH <10 07/05/2022    Metabolic Disorder Labs: Lab Results  Component Value Date   HGBA1C 5.5 04/16/2022   MPG 111 04/16/2022   MPG 105.41 01/18/2021   No results found for: "PROLACTIN" Lab Results  Component Value Date   CHOL 184 04/16/2022   TRIG 133 04/16/2022   HDL 32 (L) 04/16/2022   CHOLHDL 5.8 04/16/2022   VLDL 27 04/16/2022   LDLCALC 125 (H) 04/16/2022   LDLCALC 128 (H) 01/18/2021       Musculoskeletal: Strength & Muscle Tone: within normal limits Gait & Station: normal Patient leans: N/A   Psychiatric Specialty Exam:   Presentation  General Appearance:  Appropriate for Environment   Eye Contact: Fair   Speech: Clear and Coherent   Speech Volume: Normal   Handedness: Right     Mood and Affect  Mood: " Somewhat better"   Affect: Congruent; less constricted     Thought Processes: Coherent; Goal Directed   Descriptions of Associations:Intact   Orientation:Full (Time, Place and Person)   Thought Content:Abstract Reasoning; Logical;    History of Schizophrenia/Schizoaffective disorder:No   Duration of Psychotic Symptoms:N/A   Hallucinations:Hallucinations: Reports voices are quieter frequent, off and on towards PM   Ideas of Reference:None   Suicidal Thoughts:Suicidal Thoughts: Passive suicidal thoughts    Homicidal Thoughts:Homicidal  Thoughts: No     Sensorium  Memory: Immediate Fair; Recent Fair; Remote Fair   Judgment: Improving   Insight: Fair     Chartered certified accountant: Fair   Attention Span: Fair   Recall: Fair   Fund of Knowledge: Good   Language: Fair       Psychomotor Activity: Psychomotor Activity: Decreased      Assets: Communication Skills; Financial Resources/Insurance     Sleep: Sleep:Improved     Physical Exam Constitutional:      Appearance: Normal appearance.  HENT:     Head: Normocephalic and atraumatic.     Nose: Nose normal.  Eyes:     Pupils: Pupils are equal, round, and reactive to light.  Cardiovascular:     Rate and Rhythm: Normal rate.  Pulmonary:     Effort: Pulmonary effort is normal.  Skin:    General: Skin is warm.  Neurological:     General: No focal deficit present.     Mental Status: He is alert and oriented to person, place, and time.      Review of Systems  HENT:  Negative for congestion and hearing loss.   Eyes:  Negative for blurred vision and double vision.  Respiratory:  Negative for cough and shortness of breath.   Cardiovascular:  Negative for chest pain and palpitations.  Gastrointestinal:  Negative for heartburn, nausea and vomiting.  Neurological:  Negative for dizziness and speech change.    Blood pressure (!) 118/94, pulse 86, temperature 98 F (36.7 C), resp. rate 18, height 5\' 9"  (1.753 m), weight 79.8 kg, SpO2 98%. Body mass index is 25.99 kg/m.   Treatment Plan Summary: Daily contact with patient to assess and evaluate symptoms and progress in treatment and Medication management  Patient is admitted to locked unit under safety precautions Continue Depakote 500 mg by mouth twice daily for seizures and to target mood symptoms Increase the dose of Remeron to 15 mg at bedtime to help with depression, 09/17/2022 Continue home medicine for hypertension, metoprolol XL 50 mg daily Continue on BuSpar 5 mg by mouth twice daily for anxiety Will increase the dose of Seroquel to 300 mg at bedtime  Continue to encourage sobriety Patient was encouraged to attend group and work on coping strategies Will recommend individual therapy upon discharge Baclofen 5 mg po BID to help muscle tension  Patient was started on Wellbutrin past week. Patient  encouraged to monitor for manic symptoms  Lewanda Rife, MD

## 2022-09-30 NOTE — Group Note (Signed)
Date:  09/30/2022 Time:  10:34 AM  Group Topic/Focus:  Self Care:   The focus of this group is to help patients understand the importance of self-care in order to improve or restore emotional, physical, spiritual, interpersonal, and financial health.    Participation Level:  None  Participation Quality:   none  Affect:  Appropriate  Cognitive:  Appropriate  Insight: None  Engagement in Group:  None  Modes of Intervention:  Activity and Discussion  Additional Comments:  patient arrived for last 10 min of group.  Rodena Goldmann 09/30/2022, 10:34 AM

## 2022-09-30 NOTE — Progress Notes (Signed)
Patients walks up to nursing station and requests for me to come talk to him more privately. Patient speech has become rapid and pressured, hands fidgeting and he states, "I am having those flashbacks again, and those voices are telling me things." Patient's affect is preoccupied with thoughts of flashbacks. Patient is anxious and nervous at this time. Patient states, "whenever I close my eyes, it just gets worse." PRN given, and patient placed in dayroom with lights dim and television. Patient able to contract for safety at this time.

## 2022-09-30 NOTE — Group Note (Signed)
[facility] LCSW Group Therapy Note   Group Date: 09/30/2022 Start Time: 1315 End Time: 1400   Type of Therapy/Topic:  Group Therapy:  Balance in Life  Participation Level:  Did Not Attend   Description of Group:    This group will address the concept of balance and how it feels and looks when one is unbalanced. Patients will be encouraged to process areas in their lives that are out of balance, and identify reasons for remaining unbalanced. Facilitators will guide patients utilizing problem- solving interventions to address and correct the stressor making their life unbalanced. Understanding and applying boundaries will be explored and addressed for obtaining  and maintaining a balanced life. Patients will be encouraged to explore ways to assertively make their unbalanced needs known to significant others in their lives, using other group members and facilitator for support and feedback.  Therapeutic Goals: Patient will identify two or more emotions or situations they have that consume much of in their lives. Patient will identify signs/triggers that life has become out of balance:  Patient will identify two ways to set boundaries in order to achieve balance in their lives:  Patient will demonstrate ability to communicate their needs through discussion and/or role plays  Summary of Patient Progress:    X    Therapeutic Modalities:   Cognitive Behavioral Therapy Solution-Focused Therapy Assertiveness Training   [name], LCSWA

## 2022-09-30 NOTE — Plan of Care (Signed)
Problem: Coping: Goal: Level of anxiety will decrease Outcome: Not Progressing   Problem: Pain Managment: Goal: General experience of comfort will improve Outcome: Not Progressing

## 2022-10-01 DIAGNOSIS — F4310 Post-traumatic stress disorder, unspecified: Secondary | ICD-10-CM | POA: Diagnosis not present

## 2022-10-01 DIAGNOSIS — F3130 Bipolar disorder, current episode depressed, mild or moderate severity, unspecified: Secondary | ICD-10-CM | POA: Diagnosis not present

## 2022-10-01 MED ORDER — HYDROXYZINE HCL 25 MG PO TABS
50.0000 mg | ORAL_TABLET | Freq: Three times a day (TID) | ORAL | Status: DC | PRN
  Administered 2022-10-01 – 2022-10-07 (×12): 50 mg via ORAL
  Filled 2022-10-01 (×13): qty 2

## 2022-10-01 MED ORDER — QUETIAPINE FUMARATE 200 MG PO TABS
400.0000 mg | ORAL_TABLET | Freq: Every day | ORAL | Status: DC
  Administered 2022-10-01 – 2022-10-04 (×4): 400 mg via ORAL
  Filled 2022-10-01 (×5): qty 2

## 2022-10-01 NOTE — Group Note (Signed)
Date:  10/01/2022 Time:  5:19 PM  Group Topic/Focus:  Coping With Mental Health Crisis:   The purpose of this group is to help patients identify strategies for coping with mental health crisis.  Group discusses possible causes of crisis and ways to manage them effectively.  The pt went outside for some fresh air and talked about coping mechanisms and positive thoughts.    Participation Level:  Did Not Attend  Participation Quality:    Affect:    Cognitive:    Insight:   Engagement in Group:    Modes of Intervention:    Additional Comments:    [name] Welcome 10/01/2022, 5:19 PM

## 2022-10-01 NOTE — Progress Notes (Addendum)
Patient brought in as a voluntary admission on September 15, 2022 for reports of command hallucinations saying to hang himself, complaints of worsening depression, PTSD, poor sleep, and poor appetite. Diagnosis of bipolar disorder.  Patient currently endorses passive SI and anxiety. He agrees to Community education officer for safety. Medications adm whole without difficulty. Tylenol and hydroxyzine adm at 1616 for 8/10 neck pain and 10/10 anxiety. Upon follow up, pain decreased to a 4 and anxiety level decreased.  Q15 minute unit checks in place.

## 2022-10-01 NOTE — Group Note (Signed)
Date:  10/01/2022 Time:  8:30 PM  Group Topic/Focus:  Personal Choices and Values:   The focus of this group is to help patients assess and explore the importance of values in their lives, how their values affect their decisions, how they express their values and what opposes their expression.    Participation Level:  Active  Participation Quality:  Appropriate  Affect:  Appropriate  Cognitive:  Appropriate  Insight: Appropriate  Engagement in Group:  Engaged  Modes of Intervention:  Discussion  Additional Comments:    Burt Ek 10/01/2022, 8:30 PM

## 2022-10-01 NOTE — Progress Notes (Signed)
Patient sitting in room in the dark at this time, this RN went to round on patient and noticed that patient was responding to himself. Patient states that he is "still hearing the voices, and that they are probably never going to go away." Patient started to question about the change of medication time for his Seroquel. Patient started to get anxious because "the Seroquel helps me sleep, so I would like to get it at night time rather than 5pm and be awake all night."  Patient speech is pressured. PRN to be administered.

## 2022-10-01 NOTE — Group Note (Signed)
Date:  10/01/2022 Time:  12:24 PM  Group Topic/Focus:  Coping With Mental Health Crisis:   The purpose of this group is to help patients identify strategies for coping with mental health crisis.  Group discusses possible causes of crisis and ways to manage them effectively. Crisis Planning:   The purpose of this group is to help patients create a crisis plan for use upon discharge or in the future, as needed. Overcoming Stress:   The focus of this group is to define stress and help patients assess their triggers. Anger Management   Participation Level:  Active  Participation Quality:  Appropriate  Affect:  Appropriate  Cognitive:  Appropriate  Insight: Appropriate  Engagement in Group:  Engaged and Improving  Modes of Intervention:  Discussion and Education  Additional Comments:    Tiffney Haughton L Yuval Rubens 10/01/2022, 12:24 PM

## 2022-10-01 NOTE — BH IP Treatment Plan (Unsigned)
Interdisciplinary Treatment and Diagnostic Plan Update  10/01/2022 Time of Session: 2:28 pm [name] MRN: [id number]  Principal Diagnosis: Bipolar I disorder, most recent episode depressed (HCC)  Secondary Diagnoses: Principal Problem:   Bipolar I disorder, most recent episode depressed (HCC) Active Problems:   PTSD (post-traumatic stress disorder)   Current Medications:  Current Facility-Administered Medications  Medication Dose Route Frequency Provider Last Rate Last Admin   acetaminophen (TYLENOL) tablet 650 mg  650 mg Oral Q6H PRN Onuoha, Chinwendu V, NP   650 mg at 09/30/22 2056   alum & mag hydroxide-simeth (MAALOX/MYLANTA) 200-200-20 MG/5ML suspension 30 mL  30 mL Oral Q4H PRN Onuoha, Chinwendu V, NP   30 mL at 09/28/22 1333   baclofen (LIORESAL) tablet 5 mg  5 mg Oral BID Lewanda Rife, MD   5 mg at 10/01/22 0955   haloperidol (HALDOL) tablet 5 mg  5 mg Oral Q6H PRN Jearld Lesch, NP   5 mg at 09/30/22 2214   And   benztropine (COGENTIN) tablet 1 mg  1 mg Oral Q6H PRN Jearld Lesch, NP   1 mg at 09/30/22 2055   buPROPion (WELLBUTRIN XL) 24 hr tablet 300 mg  300 mg Oral Daily Sarina Ill, DO   300 mg at 10/01/22 0956   busPIRone (BUSPAR) tablet 5 mg  5 mg Oral BID Lewanda Rife, MD   5 mg at 10/01/22 0956   diphenhydrAMINE (BENADRYL) capsule 50 mg  50 mg Oral Q6H PRN Jearld Lesch, NP   50 mg at 09/30/22 2215   Or   diphenhydrAMINE (BENADRYL) injection 50 mg  50 mg Intramuscular Q6H PRN Jearld Lesch, NP       divalproex (DEPAKOTE) DR tablet 500 mg  500 mg Oral BID Lewanda Rife, MD   500 mg at 10/01/22 4098   haloperidol (HALDOL) tablet 5 mg  5 mg Oral TID PRN Onuoha, Chinwendu V, NP       Or   haloperidol lactate (HALDOL) injection 5 mg  5 mg Intramuscular TID PRN Onuoha, Chinwendu V, NP       hydrOXYzine (ATARAX) tablet 50 mg  50 mg Oral TID PRN Lewanda Rife, MD       magnesium hydroxide (MILK OF MAGNESIA) suspension 30 mL   30 mL Oral Daily PRN Onuoha, Chinwendu V, NP       metoprolol succinate (TOPROL-XL) 24 hr tablet 50 mg  50 mg Oral Daily Lewanda Rife, MD   50 mg at 10/01/22 0956   mirtazapine (REMERON) tablet 15 mg  15 mg Oral QHS Lewanda Rife, MD   15 mg at 09/30/22 2059   QUEtiapine (SEROQUEL) tablet 400 mg  400 mg Oral Q supper Lewanda Rife, MD       rOPINIRole (REQUIP) tablet 1 mg  1 mg Oral QHS Sarina Ill, DO   1 mg at 09/30/22 2055   PTA Medications: Medications Prior to Admission  Medication Sig Dispense Refill Last Dose   butalbital-acetaminophen-caffeine (FIORICET) 50-325-40 MG tablet Take 1 tablet by mouth every 6 (six) hours as needed for headache or migraine.      divalproex (DEPAKOTE) 500 MG DR tablet Take 1 tablet (500 mg total) by mouth 2 (two) times daily. 60 tablet 3    doxepin (SINEQUAN) 100 MG capsule Take 100 mg by mouth at bedtime.      enalapril (VASOTEC) 20 MG tablet Take 20 mg by mouth daily. (Patient not taking: Reported on 09/15/2022)  enalapril (VASOTEC) 20 MG tablet Take 1 tablet (20 mg total) by mouth daily. (Patient not taking: Reported on 09/15/2022) 30 tablet 3    FLUoxetine (PROZAC) 20 MG capsule Take 1 capsule (20 mg total) by mouth daily. (Patient not taking: Reported on 09/15/2022) 30 capsule 0    FLUoxetine (PROZAC) 20 MG capsule Take 1 capsule (20 mg total) by mouth daily. (Patient not taking: Reported on 09/15/2022) 30 capsule 3    haloperidol (HALDOL) 5 MG tablet Take 5 mg by mouth every 6 (six) hours as needed for agitation. (Patient not taking: Reported on 09/15/2022)      metoprolol succinate (TOPROL-XL) 50 MG 24 hr tablet Take 1 tablet (50 mg total) by mouth daily. 30 tablet 3    risperiDONE (RISPERDAL) 1 MG tablet Take 1 tablet (1 mg total) by mouth 2 (two) times daily at 8 am and 4 pm. (Patient not taking: Reported on 09/15/2022) 60 tablet 3    traZODone (DESYREL) 100 MG tablet Take 1 tablet (100 mg total) by mouth at bedtime as needed for sleep.  30 tablet 3    traZODone (DESYREL) 50 MG tablet Take 1 tablet (50 mg total) by mouth at bedtime as needed (Anxiety). (Patient not taking: Reported on 09/15/2022) 30 tablet 0    zolpidem (AMBIEN) 10 MG tablet Take 10 mg by mouth at bedtime.       Patient Stressors: Medication change or noncompliance    Patient Strengths: Ability for insight  Capable of independent living  Manufacturing systems engineer  Motivation for treatment/growth   Treatment Modalities: Medication Management, Group therapy, Case management,  1 to 1 session with clinician, Psychoeducation, Recreational therapy.   Physician Treatment Plan for Primary Diagnosis: Bipolar I disorder, most recent episode depressed (HCC) Long Term Goal(s): Improvement in symptoms so as ready for discharge   Short Term Goals: Ability to identify changes in lifestyle to reduce recurrence of condition will improve Ability to verbalize feelings will improve Ability to disclose and discuss suicidal ideas Ability to demonstrate self-control will improve Ability to identify and develop effective coping behaviors will improve Ability to maintain clinical measurements within normal limits will improve Compliance with prescribed medications will improve Ability to identify triggers associated with substance abuse/mental health issues will improve  Medication Management: Evaluate patient's response, side effects, and tolerance of medication regimen.  Therapeutic Interventions: 1 to 1 sessions, Unit Group sessions and Medication administration.  Evaluation of Outcomes: Progressing  Physician Treatment Plan for Secondary Diagnosis: Principal Problem:   Bipolar I disorder, most recent episode depressed (HCC) Active Problems:   PTSD (post-traumatic stress disorder)  Long Term Goal(s): Improvement in symptoms so as ready for discharge   Short Term Goals: Ability to identify changes in lifestyle to reduce recurrence of condition will improve Ability to  verbalize feelings will improve Ability to disclose and discuss suicidal ideas Ability to demonstrate self-control will improve Ability to identify and develop effective coping behaviors will improve Ability to maintain clinical measurements within normal limits will improve Compliance with prescribed medications will improve Ability to identify triggers associated with substance abuse/mental health issues will improve     Medication Management: Evaluate patient's response, side effects, and tolerance of medication regimen.  Therapeutic Interventions: 1 to 1 sessions, Unit Group sessions and Medication administration.  Evaluation of Outcomes: Progressing   RN Treatment Plan for Primary Diagnosis: Bipolar I disorder, most recent episode depressed (HCC) Long Term Goal(s): Knowledge of disease and therapeutic regimen to maintain health will improve  Short Term  Goals: Ability to remain free from injury will improve, Ability to verbalize frustration and anger appropriately will improve, Ability to demonstrate self-control, Ability to participate in decision making will improve, Ability to verbalize feelings will improve, Ability to disclose and discuss suicidal ideas, Ability to identify and develop effective coping behaviors will improve, and Compliance with prescribed medications will improve  Medication Management: RN will administer medications as ordered by provider, will assess and evaluate patient's response and provide education to patient for prescribed medication. RN will report any adverse and/or side effects to prescribing provider.  Therapeutic Interventions: 1 on 1 counseling sessions, Psychoeducation, Medication administration, Evaluate responses to treatment, Monitor vital signs and CBGs as ordered, Perform/monitor CIWA, COWS, AIMS and Fall Risk screenings as ordered, Perform wound care treatments as ordered.  Evaluation of Outcomes: Progressing   LCSW Treatment Plan for Primary  Diagnosis: Bipolar I disorder, most recent episode depressed (HCC) Long Term Goal(s): Safe transition to appropriate next level of care at discharge, Engage patient in therapeutic group addressing interpersonal concerns.  Short Term Goals: Engage patient in aftercare planning with referrals and resources, Increase social support, Increase ability to appropriately verbalize feelings, Increase emotional regulation, Facilitate acceptance of mental health diagnosis and concerns, Facilitate patient progression through stages of change regarding substance use diagnoses and concerns, Identify triggers associated with mental health/substance abuse issues, and Increase skills for wellness and recovery  Therapeutic Interventions: Assess for all discharge needs, 1 to 1 time with Social worker, Explore available resources and support systems, Assess for adequacy in community support network, Educate family and significant other(s) on suicide prevention, Complete Psychosocial Assessment, Interpersonal group therapy.  Evaluation of Outcomes: Progressing   Progress in Treatment: Attending groups: No. Participating in groups: No. Taking medication as prescribed: Yes. Toleration medication: Yes. Family/Significant other contact made: Yes, individual(s) contacted:  once patient gives permission Patient understands diagnosis: Yes. Discussing patient identified problems/goals with staff: Yes. Medical problems stabilized or resolved: Yes. Denies suicidal/homicidal ideation: Yes. Issues/concerns per patient self-inventory: No. Other: none  New problem(s) identified: No, Describe:  none  New Short Term/Long Term Goal(s): d elimination of symptoms of psychosis, medication management for mood stabilization; elimination of SI thoughts; development of comprehensive mental wellness plan.  Update 10/01/22: none at this time.    Patient Goals:  " I don't have any goals" Update 09/21/22: No changes at this time Update  09/26/2022: No changes at this time   Update 10/01/22: none at this time.   Discharge Plan or Barriers: CSW will assist with appropriate discharge planning   Update 10/01/22: none at this time.   Reason for Continuation of Hospitalization: Depression Medication stabilization   Estimated Length of Stay: 1 to 7 days  Update 10/01/22: none at this time.  Last 3 [place] Suicide Severity Risk Score: Flowsheet Row Admission (Current) from 09/15/2022 in [facility] BEHAVIORAL MEDICINE Most recent reading at 09/15/2022 10:00 PM ED from 09/15/2022 in [facility] Emergency Department at [facility] Most recent reading at 09/15/2022  5:26 PM Admission (Discharged) from 07/06/2022 in [facility] INPATIENT BEHAVIORAL MEDICINE Most recent reading at 07/06/2022  3:14 PM  C-SSRS RISK CATEGORY Moderate Risk High Risk No Risk       Last PHQ 2/9 Scores:     No data to display          Scribe for Treatment Team: Marshell Levan, LCSW 10/01/2022 2:28 PM

## 2022-10-01 NOTE — Progress Notes (Signed)
   10/01/22 0601  15 Minute Checks  Location Bedroom  Visual Appearance Calm  Behavior Sleeping  Sleep (Behavioral Health Patients Only)  Calculate sleep? (Click Yes once per 24 hr at 0600 safety check) Yes  Documented sleep last 24 hours 10

## 2022-10-01 NOTE — Progress Notes (Signed)
Patient rounded on by this RN. Patient appears to be resting. Lying on right side. Chest rise and fall noted. Unlabored respirations. No distress noted at this time. Q15 mins checks continued.

## 2022-10-01 NOTE — Plan of Care (Signed)
  Problem: Activity: Goal: Risk for activity intolerance will decrease Outcome: Progressing   Problem: Nutrition: Goal: Adequate nutrition will be maintained Outcome: Progressing   Problem: Coping: Goal: Level of anxiety will decrease Outcome: Not Progressing   Problem: Education: Goal: Ability to state activities that reduce stress will improve Outcome: Not Progressing

## 2022-10-01 NOTE — Progress Notes (Signed)
[facility] MD Progress Note  10/01/2022 [name]  MRN:  [id number]  Subjective: Case discussed with staff, chart reviewed, patient seen during rounds.  Patient reportedly had a flareup of" PTSD".  Patient received as needed Haldol, Ativan, and Benadryl last night.  Today patient reports that he slept good after getting as needed meds.  At this point in time patient denies suicidal thoughts or auditory hallucinations.  Patient said that his voices get intense late afternoon or early evening.  Patient agrees to take the dose of Seroquel with supper instead of bedtime.  Patient has been more visible on the unit.  He has been attending groups.   When asked about suicidal thoughts patient reports" I always have those thoughts, but I have no intention or plan to act on those thoughts".  Patient continues to talk about PTSD.  Patient was provided with support. He denies any intention or plan to harm himself on the unit.  Principal Problem: Bipolar I disorder, most recent episode depressed (HCC) Diagnosis: Principal Problem:   Bipolar I disorder, most recent episode depressed (HCC) Active Problems:   PTSD (post-traumatic stress disorder)    Past Psychiatric History: Reports h/o Bipolar disorder, anxiety and PTSD. Reports  multiple psychiatric hospitalization. Denies past suicide attempts.   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 echo 5/17: EF 60-65%, no RWMA, LV dias fxn nl, LA mildly dilated, RV sys fxn nl, PASP nl, moderate sized circumferential pericardial effusion was identified, 2.12 cm around the LV free wall, <1 cm around the RV free  wall. Features were not c/w tamponade physiology   PTSD (post-traumatic stress disorder)    Witnessed brother's suicide.   Restless leg syndrome    Seizures (HCC)    Syncope     Past Surgical History:  Procedure Laterality Date   CYSTOSCOPY WITH URETEROSCOPY AND STENT PLACEMENT     ESOPHAGOGASTRODUODENOSCOPY N/A 01/11/2016   Procedure: ESOPHAGOGASTRODUODENOSCOPY (EGD);  Surgeon: Charlott Rakes, MD;  Location: [facility] ENDOSCOPY;  Service: Endoscopy;  Laterality: N/A;   ESOPHAGOGASTRODUODENOSCOPY N/A 04/09/2020   Procedure: ESOPHAGOGASTRODUODENOSCOPY (EGD);  Surgeon: [name], MD;  Location: [facility] ENDOSCOPY;  Service: Gastroenterology;  Laterality: N/A;   INCISION AND DRAINAGE ABSCESS Left 01/02/2018   Procedure: INCISION AND DRAINAGE LEFT KNEE;  Surgeon: Deeann Saint, MD;  Location: [facility] ORS;  Service: Orthopedics;  Laterality: Left;   JOINT REPLACEMENT Right    TKR   KNEE ARTHROSCOPY Right 06/25/2014   Procedure: ARTHROSCOPY KNEE;  Surgeon: Deeann Saint, MD;  Location: [facility] ORS;  Service: Orthopedics;  Laterality: Right;  partial arthroscopic medial menisectomy   LAPAROSCOPIC APPENDECTOMY N/A 06/02/2021   Procedure: APPENDECTOMY LAPAROSCOPIC;  Surgeon: Campbell Lerner, MD;  Location: [facility] ORS;  Service: General;  Laterality: N/A;   TOTAL KNEE ARTHROPLASTY Right 04/22/2015   Procedure: TOTAL KNEE ARTHROPLASTY;  Surgeon: Deeann Saint, MD;  Location: [facility] ORS;  Service: Orthopedics;  Laterality: Right;   TOTAL KNEE ARTHROPLASTY Left 10/30/2017   Procedure: TOTAL KNEE ARTHROPLASTY;  Surgeon: Deeann Saint, MD;  Location: [facility] ORS;  Service: Orthopedics;  Laterality: Left;   TOTAL KNEE REVISION Left 01/02/2018   Procedure: poly exchange of tibia and patella left knee;  Surgeon: Deeann Saint, MD;  Location: [facility] ORS;  Service: Orthopedics;  Laterality: Left;   UMBILICAL HERNIA REPAIR  06/02/2021   Procedure: HERNIA REPAIR UMBILICAL ADULT;  Surgeon: Campbell Lerner, MD;  Location: [facility] ORS;   Service: General;;   Family History:  Family History  Problem Relation Age of Onset   CVA Mother        deceased at age 13   Depression Brother        Died by suicide at age 78    Social History:  Social History   Substance and Sexual Activity  Alcohol Use Not Currently   Comment: rare     Social History   Substance and Sexual Activity  Drug Use Yes   Types: Marijuana, Cocaine    Social History   Socioeconomic History   Marital status: Single    Spouse name: Not on file   Number of children: Not on file   Years of education: Not on file   Highest education level: Not on file  Occupational History   Not on file  Tobacco Use   Smoking status: Former    Current packs/day: 0.00    Average packs/day: 0.8 packs/day for 20.0 years (15.0 ttl pk-yrs)    Types: Cigarettes    Start date: 05/16/1964    Quit date: 05/16/1984    Years since quitting: 38.4   Smokeless tobacco: Never  Vaping Use   Vaping status: Never Used  Substance and Sexual Activity   Alcohol use: Not Currently    Comment: rare   Drug use: Yes    Types: Marijuana, Cocaine   Sexual activity: Not on file  Other Topics Concern   Not on file  Social History Narrative   ** Merged History Encounter **       Social Determinants of Health   Financial Resource Strain: Not on file  Food Insecurity: Food Insecurity Present (09/15/2022)   Hunger Vital Sign    Worried About Running Out of Food in the Last Year: Sometimes true    Ran Out of Food in the Last Year: Sometimes true  Transportation Needs: Unmet Transportation Needs (09/15/2022)   PRAPARE - Administrator, Civil Service (Medical): Yes    Lack of Transportation (Non-Medical): Yes  Physical Activity: Not on file  Stress: Not on file  Social Connections: Not on file   Additional Social History:                         Sleep: Improved  Appetite:  Fair  Current Medications: Current Facility-Administered Medications  Medication  Dose Route Frequency Provider Last Rate Last Admin   acetaminophen (TYLENOL) tablet 650 mg  650 mg Oral Q6H PRN Onuoha, Chinwendu V, NP   650 mg at 09/30/22 2056   alum & mag hydroxide-simeth (MAALOX/MYLANTA) 200-200-20 MG/5ML suspension 30 mL  30 mL Oral Q4H PRN Onuoha, Chinwendu V, NP   30 mL at 09/28/22 1333   baclofen (LIORESAL) tablet 5 mg  5 mg Oral BID Lewanda Rife, MD   5 mg at 10/01/22 0955   haloperidol (HALDOL) tablet 5 mg  5 mg Oral Q6H PRN Jearld Lesch, NP   5 mg at 09/30/22 2214   And   benztropine (COGENTIN) tablet 1 mg  1 mg Oral Q6H PRN Jearld Lesch,  NP   1 mg at 09/30/22 2055   buPROPion (WELLBUTRIN XL) 24 hr tablet 300 mg  300 mg Oral Daily Sarina Ill, DO   300 mg at 10/01/22 0956   busPIRone (BUSPAR) tablet 5 mg  5 mg Oral BID Lewanda Rife, MD   5 mg at 10/01/22 0956   diphenhydrAMINE (BENADRYL) capsule 50 mg  50 mg Oral Q6H PRN Jearld Lesch, NP   50 mg at 09/30/22 2215   Or   diphenhydrAMINE (BENADRYL) injection 50 mg  50 mg Intramuscular Q6H PRN Jearld Lesch, NP       divalproex (DEPAKOTE) DR tablet 500 mg  500 mg Oral BID Lewanda Rife, MD   500 mg at 10/01/22 1610   haloperidol (HALDOL) tablet 5 mg  5 mg Oral TID PRN Onuoha, Chinwendu V, NP       Or   haloperidol lactate (HALDOL) injection 5 mg  5 mg Intramuscular TID PRN Onuoha, Chinwendu V, NP       hydrOXYzine (ATARAX) tablet 50 mg  50 mg Oral TID PRN Lewanda Rife, MD       magnesium hydroxide (MILK OF MAGNESIA) suspension 30 mL  30 mL Oral Daily PRN Onuoha, Chinwendu V, NP       metoprolol succinate (TOPROL-XL) 24 hr tablet 50 mg  50 mg Oral Daily Lewanda Rife, MD   50 mg at 10/01/22 0956   mirtazapine (REMERON) tablet 15 mg  15 mg Oral QHS Lewanda Rife, MD   15 mg at 09/30/22 2059   QUEtiapine (SEROQUEL) tablet 400 mg  400 mg Oral Q supper Lewanda Rife, MD       rOPINIRole (REQUIP) tablet 1 mg  1 mg Oral QHS Sarina Ill, DO   1 mg at 09/30/22  2055    Lab Results:  No results found for this or any previous visit (from the past 48 hour(s)).   Blood Alcohol level:  Lab Results  Component Value Date   ETH <10 09/15/2022   ETH <10 07/05/2022    Metabolic Disorder Labs: Lab Results  Component Value Date   HGBA1C 5.5 04/16/2022   MPG 111 04/16/2022   MPG 105.41 01/18/2021   No results found for: "PROLACTIN" Lab Results  Component Value Date   CHOL 184 04/16/2022   TRIG 133 04/16/2022   HDL 32 (L) 04/16/2022   CHOLHDL 5.8 04/16/2022   VLDL 27 04/16/2022   LDLCALC 125 (H) 04/16/2022   LDLCALC 128 (H) 01/18/2021       Musculoskeletal: Strength & Muscle Tone: within normal limits Gait & Station: normal Patient leans: N/A   Psychiatric Specialty Exam:   Presentation  General Appearance:  Appropriate for Environment   Eye Contact: Fair   Speech: Clear and Coherent   Speech Volume: Normal   Handedness: Right     Mood and Affect  Mood: " Somewhat better"   Affect: Congruent; less constricted     Thought Processes: Coherent; Goal Directed   Descriptions of Associations:Intact   Orientation:Full (Time, Place and Person)   Thought Content:Abstract Reasoning; Logical;    History of Schizophrenia/Schizoaffective disorder:No   Duration of Psychotic Symptoms:N/A   Hallucinations:Hallucinations: Reports voices are more intense towards PM   Ideas of Reference:None   Suicidal Thoughts:Suicidal Thoughts: Passive suicidal thoughts    Homicidal Thoughts:Homicidal Thoughts: No     Sensorium  Memory: Immediate Fair; Recent Fair; Remote Fair   Judgment: Improving   Insight: Advertising copywriter  Concentration: Fair   Attention Span: Fair   Recall: [employer] of Knowledge: Good   Language: Fair       Psychomotor Activity: Psychomotor Activity: Decreased     Assets: Communication Skills; Financial Resources/Insurance     Sleep: Sleep:Improved      Physical Exam Constitutional:      Appearance: Normal appearance.  HENT:     Head: Normocephalic and atraumatic.     Nose: Nose normal.  Eyes:     Pupils: Pupils are equal, round, and reactive to light.  Cardiovascular:     Rate and Rhythm: Normal rate.  Pulmonary:     Effort: Pulmonary effort is normal.  Skin:    General: Skin is warm.  Neurological:     General: No focal deficit present.     Mental Status: He is alert and oriented to person, place, and time.      Review of Systems  HENT:  Negative for congestion and hearing loss.   Eyes:  Negative for blurred vision and double vision.  Respiratory:  Negative for cough and shortness of breath.   Cardiovascular:  Negative for chest pain and palpitations.  Gastrointestinal:  Negative for heartburn, nausea and vomiting.  Neurological:  Negative for dizziness and speech change.    Blood pressure 113/84, pulse 78, temperature 98.3 F (36.8 C), resp. rate 17, height 5\' 9"  (1.753 m), weight 79.8 kg, SpO2 96%. Body mass index is 25.99 kg/m.   Treatment Plan Summary: Daily contact with patient to assess and evaluate symptoms and progress in treatment and Medication management  Patient is admitted to locked unit under safety precautions Continue Depakote 500 mg by mouth twice daily for seizures and to target mood symptoms Increase the dose of Remeron to 15 mg at bedtime to help with depression, 09/17/2022 Continue home medicine for hypertension, metoprolol XL 50 mg daily Continue on BuSpar 5 mg by mouth twice daily for anxiety Increase the dose of Seroquel to 400 mg with supper, 10/01/22  Continue to encourage sobriety Patient was encouraged to attend group and work on coping strategies Will recommend individual therapy upon discharge Baclofen 5 mg po BID to help muscle tension  Patient was started on Wellbutrin past week. Patient encouraged to monitor for manic symptoms, will consider decreasing the dose of Wellbutrin XL from 300  mg to 150 mg if patient experiences racing thoughts or mood irritability  Lewanda Rife, MD

## 2022-10-01 NOTE — Progress Notes (Signed)
   10/01/22 2100  Psych Admission Type (Psych Patients Only)  Admission Status Voluntary  Psychosocial Assessment  Patient Complaints Anxiety;Depression;Worrying;Restlessness  Eye Contact Brief  Facial Expression Anxious;Sad  Affect Anxious;Sad  Speech Pressured  Interaction Assertive  Motor Activity Slow  Appearance/Hygiene In scrubs  Behavior Characteristics Anxious  Mood Anxious  Thought Process  Coherency WDL  Content Preoccupation  Delusions Controlled  Perception Hallucinations  Hallucination Auditory;Command  Judgment WDL  Confusion None  Danger to Self  Current suicidal ideation? Passive  Self-Injurious Behavior No self-injurious ideation or behavior indicators observed or expressed   Agreement Not to Harm Self Yes  Description of Agreement verbal  Danger to Others  Danger to Others None reported or observed

## 2022-10-02 DIAGNOSIS — F4310 Post-traumatic stress disorder, unspecified: Secondary | ICD-10-CM | POA: Diagnosis not present

## 2022-10-02 DIAGNOSIS — F3130 Bipolar disorder, current episode depressed, mild or moderate severity, unspecified: Secondary | ICD-10-CM | POA: Diagnosis not present

## 2022-10-02 MED ORDER — BUPROPION HCL ER (XL) 150 MG PO TB24
150.0000 mg | ORAL_TABLET | Freq: Every day | ORAL | Status: DC
  Administered 2022-10-03 – 2022-10-07 (×5): 150 mg via ORAL
  Filled 2022-10-02 (×5): qty 1

## 2022-10-02 NOTE — Progress Notes (Signed)
[facility] MD Progress Note  10/02/2022 [name]  MRN:  [id number]  Subjective: Case discussed with staff, chart reviewed, patient seen during rounds.  Staff reports that patient received as needed Haldol,  and Benadryl last night.  Today patient reports that he feels more anxious, he also reports that for the past few days his symptoms are getting worse late in the evening.  It is noted that patient was started on Wellbutrin which was increased increased to 300 mg past week.  Patient agrees to decrease the dose of Wellbutrin to 150 mg.  Effect of Wellbutrin on bipolar disorder and manic symptoms discussed with the patient.  Patient reports that he still have passive suicidal ideations, denies any intention to harm himself on the unit.  At this point in time he denies auditory or visual hallucinations.  Patient has been attending groups and working on coping strategies.   Patient has been more visible on the unit.    Principal Problem: Bipolar I disorder, most recent episode depressed (HCC) Diagnosis: Principal Problem:   Bipolar I disorder, most recent episode depressed (HCC) Active Problems:   PTSD (post-traumatic stress disorder)    Past Psychiatric History: Reports h/o Bipolar disorder, anxiety and PTSD. Reports  multiple psychiatric hospitalization. Denies past suicide attempts.   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 echo 5/17: EF 60-65%, no RWMA, LV dias fxn nl, LA mildly dilated, RV sys fxn nl, PASP nl, moderate sized circumferential pericardial effusion was identified, 2.12 cm around the LV free wall, <1 cm around the RV  free wall. Features were not c/w tamponade physiology   PTSD (post-traumatic stress disorder)    Witnessed brother's suicide.   Restless leg syndrome    Seizures (HCC)    Syncope     Past Surgical History:  Procedure Laterality Date   CYSTOSCOPY WITH URETEROSCOPY AND STENT PLACEMENT     ESOPHAGOGASTRODUODENOSCOPY N/A 01/11/2016   Procedure: ESOPHAGOGASTRODUODENOSCOPY (EGD);  Surgeon: Charlott Rakes, MD;  Location: [facility] ENDOSCOPY;  Service: Endoscopy;  Laterality: N/A;   ESOPHAGOGASTRODUODENOSCOPY N/A 04/09/2020   Procedure: ESOPHAGOGASTRODUODENOSCOPY (EGD);  Surgeon: [name], MD;  Location: [facility] ENDOSCOPY;  Service: Gastroenterology;  Laterality: N/A;   INCISION AND DRAINAGE ABSCESS Left 01/02/2018   Procedure: INCISION AND DRAINAGE LEFT KNEE;  Surgeon: Deeann Saint, MD;  Location: [facility] ORS;  Service: Orthopedics;  Laterality: Left;   JOINT REPLACEMENT Right    TKR   KNEE ARTHROSCOPY Right 06/25/2014   Procedure: ARTHROSCOPY KNEE;  Surgeon: Deeann Saint, MD;  Location: [facility] ORS;  Service: Orthopedics;  Laterality: Right;  partial arthroscopic medial menisectomy   LAPAROSCOPIC APPENDECTOMY N/A 06/02/2021   Procedure: APPENDECTOMY LAPAROSCOPIC;  Surgeon: Campbell Lerner, MD;  Location: [facility] ORS;  Service: General;  Laterality: N/A;   TOTAL KNEE ARTHROPLASTY Right 04/22/2015   Procedure: TOTAL KNEE ARTHROPLASTY;  Surgeon: Deeann Saint, MD;  Location: [facility] ORS;  Service: Orthopedics;  Laterality: Right;   TOTAL KNEE ARTHROPLASTY Left 10/30/2017   Procedure: TOTAL KNEE ARTHROPLASTY;  Surgeon: Deeann Saint, MD;  Location: [facility]  ORS;  Service: Orthopedics;  Laterality: Left;   TOTAL KNEE REVISION Left 01/02/2018   Procedure: poly exchange of tibia and patella left knee;  Surgeon: Deeann Saint, MD;  Location: [facility] ORS;  Service: Orthopedics;  Laterality: Left;   UMBILICAL HERNIA REPAIR  06/02/2021   Procedure: HERNIA REPAIR UMBILICAL ADULT;  Surgeon: Campbell Lerner, MD;  Location: [facility]  ORS;  Service: General;;   Family History:  Family History  Problem Relation Age of Onset   CVA Mother        deceased at age 76   Depression Brother        Died by suicide at age 63    Social History:  Social History   Substance and Sexual Activity  Alcohol Use Not Currently   Comment: rare     Social History   Substance and Sexual Activity  Drug Use Yes   Types: Marijuana, Cocaine    Social History   Socioeconomic History   Marital status: Single    Spouse name: Not on file   Number of children: Not on file   Years of education: Not on file   Highest education level: Not on file  Occupational History   Not on file  Tobacco Use   Smoking status: Former    Current packs/day: 0.00    Average packs/day: 0.8 packs/day for 20.0 years (15.0 ttl pk-yrs)    Types: Cigarettes    Start date: 05/16/1964    Quit date: 05/16/1984    Years since quitting: 38.4   Smokeless tobacco: Never  Vaping Use   Vaping status: Never Used  Substance and Sexual Activity   Alcohol use: Not Currently    Comment: rare   Drug use: Yes    Types: Marijuana, Cocaine   Sexual activity: Not on file  Other Topics Concern   Not on file  Social History Narrative   ** Merged History Encounter **       Social Determinants of Health   Financial Resource Strain: Not on file  Food Insecurity: Food Insecurity Present (09/15/2022)   Hunger Vital Sign    Worried About Running Out of Food in the Last Year: Sometimes true    Ran Out of Food in the Last Year: Sometimes true  Transportation Needs: Unmet Transportation Needs (09/15/2022)   PRAPARE - Administrator, Civil Service (Medical): Yes    Lack of Transportation (Non-Medical): Yes  Physical Activity: Not on file  Stress: Not on file  Social Connections: Not on file   Additional Social History:                         Sleep: Improved  Appetite:  Fair  Current Medications: Current Facility-Administered Medications   Medication Dose Route Frequency Provider Last Rate Last Admin   acetaminophen (TYLENOL) tablet 650 mg  650 mg Oral Q6H PRN Onuoha, Chinwendu V, NP   650 mg at 10/02/22 0939   alum & mag hydroxide-simeth (MAALOX/MYLANTA) 200-200-20 MG/5ML suspension 30 mL  30 mL Oral Q4H PRN Onuoha, Chinwendu V, NP   30 mL at 09/28/22 1333   baclofen (LIORESAL) tablet 5 mg  5 mg Oral BID Lewanda Rife, MD   5 mg at 10/02/22 0939   haloperidol (HALDOL) tablet 5 mg  5 mg Oral Q6H PRN Jearld Lesch, NP   5 mg at 10/01/22 2037   And   benztropine (COGENTIN) tablet 1 mg  1 mg Oral Q6H PRN Dixon,  Elray Buba, NP   1 mg at 10/01/22 2037   [START ON 10/03/2022] buPROPion (WELLBUTRIN XL) 24 hr tablet 150 mg  150 mg Oral Daily Lewanda Rife, MD       busPIRone (BUSPAR) tablet 5 mg  5 mg Oral BID Lewanda Rife, MD   5 mg at 10/02/22 1610   diphenhydrAMINE (BENADRYL) capsule 50 mg  50 mg Oral Q6H PRN Jearld Lesch, NP   50 mg at 10/01/22 2037   Or   diphenhydrAMINE (BENADRYL) injection 50 mg  50 mg Intramuscular Q6H PRN Jearld Lesch, NP       divalproex (DEPAKOTE) DR tablet 500 mg  500 mg Oral BID Lewanda Rife, MD   500 mg at 10/02/22 9604   haloperidol (HALDOL) tablet 5 mg  5 mg Oral TID PRN Onuoha, Chinwendu V, NP       Or   haloperidol lactate (HALDOL) injection 5 mg  5 mg Intramuscular TID PRN Onuoha, Chinwendu V, NP       hydrOXYzine (ATARAX) tablet 50 mg  50 mg Oral TID PRN Lewanda Rife, MD   50 mg at 10/02/22 0938   magnesium hydroxide (MILK OF MAGNESIA) suspension 30 mL  30 mL Oral Daily PRN Onuoha, Chinwendu V, NP       metoprolol succinate (TOPROL-XL) 24 hr tablet 50 mg  50 mg Oral Daily Lewanda Rife, MD   50 mg at 10/01/22 0956   mirtazapine (REMERON) tablet 15 mg  15 mg Oral QHS Lewanda Rife, MD   15 mg at 10/01/22 2041   QUEtiapine (SEROQUEL) tablet 400 mg  400 mg Oral Q supper Lewanda Rife, MD   400 mg at 10/01/22 1616   rOPINIRole (REQUIP) tablet 1 mg  1 mg Oral  QHS Sarina Ill, DO   1 mg at 10/01/22 2038    Lab Results:  No results found for this or any previous visit (from the past 48 hour(s)).   Blood Alcohol level:  Lab Results  Component Value Date   ETH <10 09/15/2022   ETH <10 07/05/2022    Metabolic Disorder Labs: Lab Results  Component Value Date   HGBA1C 5.5 04/16/2022   MPG 111 04/16/2022   MPG 105.41 01/18/2021   No results found for: "PROLACTIN" Lab Results  Component Value Date   CHOL 184 04/16/2022   TRIG 133 04/16/2022   HDL 32 (L) 04/16/2022   CHOLHDL 5.8 04/16/2022   VLDL 27 04/16/2022   LDLCALC 125 (H) 04/16/2022   LDLCALC 128 (H) 01/18/2021       Musculoskeletal: Strength & Muscle Tone: within normal limits Gait & Station: normal Patient leans: N/A   Psychiatric Specialty Exam:   Presentation  General Appearance:  Appropriate for Environment   Eye Contact: Fair   Speech: Clear and Coherent   Speech Volume: Normal   Handedness: Right     Mood and Affect  Mood: " Anxious"   Affect: Congruent;     Thought Processes: Coherent; Goal Directed   Descriptions of Associations:Intact   Orientation:Full (Time, Place and Person)   Thought Content:Abstract Reasoning; Logical;    History of Schizophrenia/Schizoaffective disorder:No   Duration of Psychotic Symptoms:N/A   Hallucinations:Hallucinations: Reports voices are more intense towards PM   Ideas of Reference:None   Suicidal Thoughts:Suicidal Thoughts: Passive suicidal thoughts    Homicidal Thoughts:Homicidal Thoughts: No     Sensorium  Memory: Immediate Fair; Recent Fair; Remote Fair   Judgment: Improving   Insight: Fair  Executive Functions  Concentration: Fair   Attention Span: Fair   Recall: [employer] of Knowledge: Good   Language: Fair       Psychomotor Activity: Psychomotor Activity: Decreased     Assets: Communication Skills; Financial Resources/Insurance      Sleep: Sleep:Improved     Physical Exam Constitutional:      Appearance: Normal appearance.  HENT:     Head: Normocephalic and atraumatic.     Nose: Nose normal.  Eyes:     Pupils: Pupils are equal, round, and reactive to light.  Cardiovascular:     Rate and Rhythm: Normal rate.  Pulmonary:     Effort: Pulmonary effort is normal.  Skin:    General: Skin is warm.  Neurological:     General: No focal deficit present.     Mental Status: He is alert and oriented to person, place, and time.      Review of Systems  HENT:  Negative for congestion and hearing loss.   Eyes:  Negative for blurred vision and double vision.  Respiratory:  Negative for cough and shortness of breath.   Cardiovascular:  Negative for chest pain and palpitations.  Gastrointestinal:  Negative for heartburn, nausea and vomiting.  Neurological:  Negative for dizziness and speech change.    Blood pressure 114/75, pulse 75, temperature 97.7 F (36.5 C), resp. rate 16, height 5\' 9"  (1.753 m), weight 79.8 kg, SpO2 97%. Body mass index is 25.99 kg/m.   Treatment Plan Summary: Daily contact with patient to assess and evaluate symptoms and progress in treatment and Medication management  Patient is admitted to locked unit under safety precautions Continue Depakote 500 mg by mouth twice daily for seizures and to target mood symptoms, VPA level, SGOT/SGPT 10/03/22 Increase the dose of Remeron to 15 mg at bedtime to help with depression, 09/17/2022 Continue home medicine for hypertension, metoprolol XL 50 mg daily Continue on BuSpar 5 mg by mouth twice daily for anxiety Increase the dose of Seroquel to 400 mg with supper, 10/01/22  Continue to encourage sobriety Patient was encouraged to attend group and work on coping strategies Will recommend individual therapy upon discharge Baclofen 5 mg po BID to help muscle tension  Patient patient reports more anxiety since he has been started on Wellbutrin.  Will decrease  the dose of Wellbutrin XL to 150 mg. 10/02/22  Lewanda Rife, MD

## 2022-10-02 NOTE — Group Note (Unsigned)
Date:  10/03/2022 Time:  12:06 AM  Group Topic/Focus:  Making Healthy Choices:   The focus of this group is to help patients identify negative/unhealthy choices they were using prior to admission and identify positive/healthier coping strategies to replace them upon discharge.    Participation Level:  Active  Participation Quality:  Appropriate  Affect:  Appropriate  Cognitive:  Appropriate  Insight: Appropriate  Engagement in Group:  Engaged  Modes of Intervention:  Discussion  Additional Comments:    Maeola Harman 10/03/2022, 12:06 AM

## 2022-10-02 NOTE — Progress Notes (Signed)
   10/02/22 1400  Psych Admission Type (Psych Patients Only)  Admission Status Voluntary  Psychosocial Assessment  Patient Complaints Anxiety;Depression  Eye Contact Brief  Facial Expression Sad  Affect Depressed  Speech Logical/coherent  Interaction Assertive  Motor Activity Slow  Appearance/Hygiene In scrubs  Behavior Characteristics Cooperative  Mood Preoccupied  Thought Process  Coherency WDL  Content Preoccupation  Delusions None reported or observed  Perception WDL  Hallucination Auditory  Judgment WDL  Confusion None  Danger to Self  Current suicidal ideation? Denies  Agreement Not to Harm Self Yes  Description of Agreement verbal  Danger to Others  Danger to Others None reported or observed

## 2022-10-02 NOTE — Progress Notes (Signed)
Patient is alert and oriented. Pt reported some pain in his neck ,  which he rated at 10/10 due to stress. Pt reported  anxiety of 10/10 no  depression, SI/AVH at this time.    Pt is compliant with meds per MD orders. Pt is calm and cooperative. Pt is pleasant with staff and peers, Pt came out for snack time  joined group  and stayed in the day room to watch a movie.Routine safety checks conducted  q15 minutes.    No adverse drug reactions observed . Pt verbally contracts for safety at this time. Pt is compliant with medications and treatment plan . Pt interacts well with others on the unit. Pt remains safe at this time . Plan of care ongoing.

## 2022-10-02 NOTE — Plan of Care (Signed)

## 2022-10-02 NOTE — Group Note (Signed)
LCSW Group Therapy Note   Group Date: 10/02/2022 Start Time: 1308 End Time: 1340   Type of Therapy and Topic:  Group Therapy: Self-care Vs. Coping Skills  Participation Level:  Minimal      Summary of Patient Progress:  The patient attended group. Patient proved open to input from peers and feedback from [facility]. Patient demonstrated insight into the subject matter and was respectful of peers. Patient participated during ice breaker.     Marshell Levan, LCSWA 10/02/2022  1:59 PM

## 2022-10-02 NOTE — Progress Notes (Signed)
   10/02/22 6578  15 Minute Checks  Location Bedroom  Visual Appearance Calm  Behavior Sleeping  Sleep (Behavioral Health Patients Only)  Calculate sleep? (Click Yes once per 24 hr at 0600 safety check) Yes  Documented sleep last 24 hours 12.25

## 2022-10-02 NOTE — Group Note (Signed)
Date:  10/02/2022 Time:  11:29 AM  Group Topic/Focus:  Making Healthy Choices:   The focus of this group is to help patients identify negative/unhealthy choices they were using prior to admission and identify positive/healthier coping strategies to replace them upon discharge. Managing Feelings:   The focus of this group is to identify what feelings patients have difficulty handling and develop a plan to handle them in a healthier way upon discharge. Overcoming Stress:   The focus of this group is to define stress and help patients assess their triggers. Personal Choices and Values:   The focus of this group is to help patients assess and explore the importance of values in their lives, how their values affect their decisions, how they express their values and what opposes their expression. Self Care:   The focus of this group is to help patients understand the importance of self-care in order to improve or restore emotional, physical, spiritual, interpersonal, and financial health.    Participation Level:  Did Not Attend  Participation Quality: Did Not Attend  Affect:  Did Not Attend  Cognitive:  Did Not Attend  Insight: Did Not Attend  Engagement in Group: Did Not Attend  Modes of Intervention:  Did Not Attend  Additional Comments:  Did Not Attend  Sonam Wandel l Nori Poland 10/02/2022, 11:29 AM

## 2022-10-03 DIAGNOSIS — F3130 Bipolar disorder, current episode depressed, mild or moderate severity, unspecified: Secondary | ICD-10-CM | POA: Diagnosis not present

## 2022-10-03 DIAGNOSIS — F4310 Post-traumatic stress disorder, unspecified: Secondary | ICD-10-CM | POA: Diagnosis not present

## 2022-10-03 LAB — ALT: ALT: 18 U/L (ref 0–44)

## 2022-10-03 LAB — AST: AST: 17 U/L (ref 15–41)

## 2022-10-03 LAB — VALPROIC ACID LEVEL: Valproic Acid Lvl: 28 ug/mL — ABNORMAL LOW (ref 50.0–100.0)

## 2022-10-03 NOTE — Group Note (Deleted)
Date:  10/03/2022 Time:  11:59 AM  Group Topic/Focus:  Emotional Education:   The focus of this group is to discuss what feelings/emotions are, and how they are experienced. Wellness Toolbox:   The focus of this group is to discuss various aspects of wellness, balancing those aspects and exploring ways to increase the ability to experience wellness.  Patients will create a wellness toolbox for use upon discharge.     Participation Level:  {BHH PARTICIPATION ZOXWR:60454}  Participation Quality:  {BHH PARTICIPATION QUALITY:22265}  Affect:  {BHH AFFECT:22266}  Cognitive:  {BHH COGNITIVE:22267}  Insight: {BHH Insight2:20797}  Engagement in Group:  {BHH ENGAGEMENT IN UJWJX:91478}  Modes of Intervention:  {BHH MODES OF INTERVENTION:22269}  Additional Comments:  ***  Ardelle Anton 10/03/2022, 11:59 AM

## 2022-10-03 NOTE — Progress Notes (Signed)
[facility] MD Progress Note  10/03/2022 [name]  MRN:  [id number]  Subjective: Case discussed with staff in multidisciplinary meeting, chart reviewed, patient seen during rounds.  Patient reports he feels somewhat better today.  He reports voices are quieter.  Patient denies thoughts of harming himself today which is an improvement.  Patient has been attending groups and working on coping strategies.   Patient has been more visible on the unit.    Principal Problem: Bipolar I disorder, most recent episode depressed (HCC) Diagnosis: Principal Problem:   Bipolar I disorder, most recent episode depressed (HCC) Active Problems:   PTSD (post-traumatic stress disorder)    Past Psychiatric History: Reports h/o Bipolar disorder, anxiety and PTSD. Reports  multiple psychiatric hospitalization. Denies past suicide attempts.   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 echo 5/17: EF 60-65%, no RWMA, LV dias fxn nl, LA mildly dilated, RV sys fxn nl, PASP nl, moderate sized circumferential pericardial effusion was identified, 2.12 cm around the LV free wall, <1 cm around the RV free wall. Features were not c/w tamponade physiology   PTSD (post-traumatic stress disorder)    Witnessed brother's suicide.   Restless leg syndrome    Seizures (HCC)    Syncope     Past Surgical History:  Procedure Laterality Date   CYSTOSCOPY WITH URETEROSCOPY AND STENT PLACEMENT     ESOPHAGOGASTRODUODENOSCOPY N/A 01/11/2016   Procedure: ESOPHAGOGASTRODUODENOSCOPY (EGD);  Surgeon: Charlott Rakes, MD;  Location: [facility] ENDOSCOPY;  Service: Endoscopy;  Laterality:  N/A;   ESOPHAGOGASTRODUODENOSCOPY N/A 04/09/2020   Procedure: ESOPHAGOGASTRODUODENOSCOPY (EGD);  Surgeon: [name], MD;  Location: [facility] ENDOSCOPY;  Service: Gastroenterology;  Laterality: N/A;   INCISION AND DRAINAGE ABSCESS Left 01/02/2018   Procedure: INCISION AND DRAINAGE LEFT KNEE;  Surgeon: Deeann Saint, MD;  Location: [facility] ORS;  Service: Orthopedics;  Laterality: Left;   JOINT REPLACEMENT Right    TKR   KNEE ARTHROSCOPY Right 06/25/2014   Procedure: ARTHROSCOPY KNEE;  Surgeon: Deeann Saint, MD;  Location: [facility] ORS;  Service: Orthopedics;  Laterality: Right;  partial arthroscopic medial menisectomy   LAPAROSCOPIC APPENDECTOMY N/A 06/02/2021   Procedure: APPENDECTOMY LAPAROSCOPIC;  Surgeon: Campbell Lerner, MD;  Location: [facility] ORS;  Service: General;  Laterality: N/A;   TOTAL KNEE ARTHROPLASTY Right 04/22/2015   Procedure: TOTAL KNEE ARTHROPLASTY;  Surgeon: Deeann Saint, MD;  Location: [facility] ORS;  Service: Orthopedics;  Laterality: Right;   TOTAL KNEE ARTHROPLASTY Left 10/30/2017   Procedure: TOTAL KNEE ARTHROPLASTY;  Surgeon: Deeann Saint, MD;  Location: [facility] ORS;  Service: Orthopedics;  Laterality: Left;   TOTAL KNEE REVISION Left 01/02/2018   Procedure: poly exchange of tibia and patella left knee;  Surgeon: Deeann Saint, MD;  Location: [facility] ORS;  Service: Orthopedics;  Laterality: Left;   UMBILICAL HERNIA REPAIR  06/02/2021   Procedure: HERNIA REPAIR UMBILICAL ADULT;  Surgeon: Campbell Lerner, MD;  Location: [facility] ORS;  Service: General;;   Family History:  Family History  Problem Relation Age of Onset   CVA Mother  deceased at age 69   Depression Brother        Died by suicide at age 16    Social History:  Social History   Substance and Sexual Activity  Alcohol Use Not Currently   Comment: rare     Social History   Substance and Sexual Activity  Drug Use Yes   Types: Marijuana, Cocaine    Social History   Socioeconomic History   Marital status: Single     Spouse name: Not on file   Number of children: Not on file   Years of education: Not on file   Highest education level: Not on file  Occupational History   Not on file  Tobacco Use   Smoking status: Former    Current packs/day: 0.00    Average packs/day: 0.8 packs/day for 20.0 years (15.0 ttl pk-yrs)    Types: Cigarettes    Start date: 05/16/1964    Quit date: 05/16/1984    Years since quitting: 38.4   Smokeless tobacco: Never  Vaping Use   Vaping status: Never Used  Substance and Sexual Activity   Alcohol use: Not Currently    Comment: rare   Drug use: Yes    Types: Marijuana, Cocaine   Sexual activity: Not on file  Other Topics Concern   Not on file  Social History Narrative   ** Merged History Encounter **       Social Determinants of Health   Financial Resource Strain: Not on file  Food Insecurity: Food Insecurity Present (09/15/2022)   Hunger Vital Sign    Worried About Running Out of Food in the Last Year: Sometimes true    Ran Out of Food in the Last Year: Sometimes true  Transportation Needs: Unmet Transportation Needs (09/15/2022)   PRAPARE - Administrator, Civil Service (Medical): Yes    Lack of Transportation (Non-Medical): Yes  Physical Activity: Not on file  Stress: Not on file  Social Connections: Not on file   Additional Social History:                         Sleep: Improved  Appetite:  Fair  Current Medications: Current Facility-Administered Medications  Medication Dose Route Frequency Provider Last Rate Last Admin   acetaminophen (TYLENOL) tablet 650 mg  650 mg Oral Q6H PRN Onuoha, Chinwendu V, NP   650 mg at 10/02/22 1706   alum & mag hydroxide-simeth (MAALOX/MYLANTA) 200-200-20 MG/5ML suspension 30 mL  30 mL Oral Q4H PRN Onuoha, Chinwendu V, NP   30 mL at 09/28/22 1333   baclofen (LIORESAL) tablet 5 mg  5 mg Oral BID Lewanda Rife, MD   5 mg at 10/03/22 0903   haloperidol (HALDOL) tablet 5 mg  5 mg Oral Q6H PRN Jearld Lesch, NP   5 mg at 10/02/22 2317   And   benztropine (COGENTIN) tablet 1 mg  1 mg Oral Q6H PRN Jearld Lesch, NP   1 mg at 10/01/22 2037   buPROPion (WELLBUTRIN XL) 24 hr tablet 150 mg  150 mg Oral Daily Lewanda Rife, MD   150 mg at 10/03/22 0904   busPIRone (BUSPAR) tablet 5 mg  5 mg Oral BID Lewanda Rife, MD   5 mg at 10/03/22 0903   diphenhydrAMINE (BENADRYL) capsule 50 mg  50 mg Oral Q6H PRN Jearld Lesch, NP   50 mg at 10/02/22 2317   Or   diphenhydrAMINE (BENADRYL) injection 50  mg  50 mg Intramuscular Q6H PRN Jearld Lesch, NP       divalproex (DEPAKOTE) DR tablet 500 mg  500 mg Oral BID Lewanda Rife, MD   500 mg at 10/03/22 4098   haloperidol (HALDOL) tablet 5 mg  5 mg Oral TID PRN Onuoha, Chinwendu V, NP       Or   haloperidol lactate (HALDOL) injection 5 mg  5 mg Intramuscular TID PRN Onuoha, Chinwendu V, NP       hydrOXYzine (ATARAX) tablet 50 mg  50 mg Oral TID PRN Lewanda Rife, MD   50 mg at 10/03/22 0909   magnesium hydroxide (MILK OF MAGNESIA) suspension 30 mL  30 mL Oral Daily PRN Onuoha, Chinwendu V, NP       mirtazapine (REMERON) tablet 15 mg  15 mg Oral QHS Lewanda Rife, MD   15 mg at 10/02/22 2126   QUEtiapine (SEROQUEL) tablet 400 mg  400 mg Oral Q supper Lewanda Rife, MD   400 mg at 10/02/22 2131   rOPINIRole (REQUIP) tablet 1 mg  1 mg Oral QHS Sarina Ill, DO   1 mg at 10/02/22 2131    Lab Results:  Results for orders placed or performed during the hospital encounter of 09/15/22 (from the past 48 hour(s))  Valproic acid level     Status: Abnormal   Collection Time: 10/03/22  7:09 AM  Result Value Ref Range   Valproic Acid Lvl 28 (L) 50.0 - 100.0 ug/mL    Comment: Performed at [facility], [address] [place], [place] [address]  AST     Status: None   Collection Time: 10/03/22  7:09 AM  Result Value Ref Range   AST 17 15 - 41 U/L    Comment: Performed at [facility], [address] [place], [place] [address]  ALT     Status: None   Collection Time: 10/03/22  7:09 AM  Result Value Ref Range   ALT 18 0 - 44 U/L    Comment: Performed at [facility], [address] [place], [place] [address]     Blood Alcohol level:  Lab Results  Component Value Date   [facility] <10 09/15/2022   ETH <10 07/05/2022    Metabolic Disorder Labs: Lab Results  Component Value Date   HGBA1C 5.5 04/16/2022   MPG 111 04/16/2022   MPG 105.41 01/18/2021   No results found for: "PROLACTIN" Lab Results  Component Value Date   CHOL 184 04/16/2022   TRIG 133 04/16/2022   HDL 32 (L) 04/16/2022   CHOLHDL 5.8 04/16/2022   VLDL 27 04/16/2022   LDLCALC 125 (H) 04/16/2022   LDLCALC 128 (H) 01/18/2021       Musculoskeletal: Strength & Muscle Tone: within normal limits Gait & Station: normal Patient leans: N/A   Psychiatric Specialty Exam:   Presentation  General Appearance:  Appropriate for Environment   Eye Contact: Fair   Speech: Clear and Coherent   Speech Volume: Normal   Handedness: Right     Mood and Affect  Mood: " Better"   Affect: Congruent;     Thought Processes: Coherent; Goal Directed   Descriptions of Associations:Intact   Orientation:Full (Time, Place and Person)   Thought Content:Abstract Reasoning; Logical;    History of Schizophrenia/Schizoaffective disorder:No   Duration of Psychotic Symptoms:N/A   Hallucinations:Hallucinations: Reports voices are more intense towards PM   Ideas of Reference:None   Suicidal Thoughts: Patient denies suicidal thoughts today  Homicidal Thoughts:Homicidal Thoughts: No     Sensorium  Memory: Immediate Fair; Recent Fair; Remote Fair   Judgment: Improving   Insight: Fair     Chartered certified accountant: Fair   Attention Span: Fair   Recall: Fair   Fund of Knowledge: Good   Language: Fair       Psychomotor Activity: Psychomotor Activity: Decreased      Assets: Communication Skills; Financial Resources/Insurance     Sleep: Sleep:Improved     Physical Exam Constitutional:      Appearance: Normal appearance.  HENT:     Head: Normocephalic and atraumatic.     Nose: Nose normal.  Eyes:     Pupils: Pupils are equal, round, and reactive to light.  Cardiovascular:     Rate and Rhythm: Normal rate.  Pulmonary:     Effort: Pulmonary effort is normal.  Skin:    General: Skin is warm.  Neurological:     General: No focal deficit present.     Mental Status: He is alert and oriented to person, place, and time.      Review of Systems  HENT:  Negative for congestion and hearing loss.   Eyes:  Negative for blurred vision and double vision.  Respiratory:  Negative for cough and shortness of breath.   Cardiovascular:  Negative for chest pain and palpitations.  Gastrointestinal:  Negative for heartburn, nausea and vomiting.  Neurological:  Negative for dizziness and speech change.    Blood pressure 107/81, pulse 78, temperature 97.9 F (36.6 C), resp. rate 16, height 5\' 9"  (1.753 m), weight 79.8 kg, SpO2 97%. Body mass index is 25.99 kg/m.   Treatment Plan Summary: Daily contact with patient to assess and evaluate symptoms and progress in treatment and Medication management  Patient is admitted to locked unit under safety precautions Continue Depakote 500 mg by mouth twice daily for seizures and to target mood symptoms, VPA level low at 28, AST normal at 17, ALT normal at 18 , 10/03/22.  Patient is doing fine on current dose of Depakote will continue on same dose for now Increase the dose of Remeron to 15 mg at bedtime to help with depression, 09/17/2022 Continue home medicine for hypertension, metoprolol XL 50 mg daily Continue on BuSpar 5 mg by mouth twice daily for anxiety Increase the dose of Seroquel to 400 mg with supper, 10/01/22  Continue to encourage sobriety Patient was encouraged to attend group and work on coping  strategies Will recommend individual therapy upon discharge Baclofen 5 mg po BID to help muscle tension  Patient patient reports more anxiety since he has been started on Wellbutrin.  Decreased the dose of Wellbutrin XL to 150 mg. 10/02/22  Lewanda Rife, MD

## 2022-10-03 NOTE — Plan of Care (Signed)
  Problem: Pain Managment: Goal: General experience of comfort will improve Outcome: Progressing   Problem: Safety: Goal: Ability to remain free from injury will improve Outcome: Progressing   Problem: Skin Integrity: Goal: Risk for impaired skin integrity will decrease Outcome: Progressing   Problem: Coping: Goal: Level of anxiety will decrease Outcome: Not Progressing

## 2022-10-03 NOTE — Group Note (Unsigned)
Date:  10/03/2022 Time:  12:21 PM  Group Topic/Focus:  Emotional Education:   The focus of this group is to discuss what feelings/emotions are, and how they are experienced.     Participation Level:  {BHH PARTICIPATION UXNAT:55732}  Participation Quality:  {BHH PARTICIPATION QUALITY:22265}  Affect:  {BHH AFFECT:22266}  Cognitive:  {BHH COGNITIVE:22267}  Insight: {BHH Insight2:20797}  Engagement in Group:  {BHH ENGAGEMENT IN KGURK:27062}  Modes of Intervention:  {BHH MODES OF INTERVENTION:22269}  Additional Comments:  ***  Ardelle Anton 10/03/2022, 12:21 PM

## 2022-10-03 NOTE — Progress Notes (Signed)
Patient pleasant and cooperative.  Sad affect.  Present in the dayroom for breakfast.  Endorses anxiety and depression.  Denies SI and VH.  Endorses AH and flashbacks during the night.   Compliant with scheduled medications,  PRM medication for anxiety and pain (headache) given x 1.  15 min checks in place for safety.  Present in the milieu after dinner.  Appropriate interaction with staff and peers.

## 2022-10-03 NOTE — Progress Notes (Signed)
Seroquel moved to bedtime per patient request.

## 2022-10-03 NOTE — Group Note (Signed)
Recreation Therapy Group Note   Group Topic:Communication  Group Date: 10/03/2022 Start Time: 1400 End Time: 1450 Facilitators: Rosina Lowenstein, LRT, CTRS Location: Courtyard  Group Description: Emotional Check in. Patient sat and talked with LRT about how they are doing and whatever else is on their mind. LRT provided active listening, reassurance and encouragement. Pts were given the opportunity to listen to music or play cornhole while getting fresh air and sunlight in the courtyard.    Goal Area(s) Addressed: Patient will engage in conversation with LRT. Patient will communicate their wants, needs, or questions.  Patient will practice a new coping skill of "talking to someone".   Affect/Mood: N/A   Participation Level: Did not attend    Clinical Observations/Individualized Feedback: [name] did not attend group.  Plan: Continue to engage patient in RT group sessions 2-3x/week.   Rosina Lowenstein, LRT, CTRS 10/03/2022 3:27 PM

## 2022-10-03 NOTE — Group Note (Signed)
Date:  10/03/2022 Time:  8:41 PM  Group Topic/Focus:  Healthy Communication:   The focus of this group is to discuss communication, barriers to communication, as well as healthy ways to communicate with others.    Participation Level:  Active  Participation Quality:  Appropriate  Affect:  Appropriate  Cognitive:  Appropriate  Insight: Appropriate  Engagement in Group:  Engaged  Modes of Intervention:  Confrontation  Additional Comments:    Garry Heater 10/03/2022, 8:41 PM

## 2022-10-03 NOTE — Group Note (Signed)
Date:  10/03/2022 Time:  12:43 PM  Group Topic/Focus:  Emotional Education:   The focus of this group is to discuss what feelings/emotions are, and how they are experienced.    Participation Level:  Did Not Attend  Participation Quality:    Affect:    Cognitive:    Insight:   Engagement in Group:   Modes of Intervention:    Additional Comments:    Ardelle Anton 10/03/2022, 12:43 PM

## 2022-10-03 NOTE — Group Note (Signed)
[facility] LCSW Group Therapy Note   Group Date: 10/03/2022 Start Time: 1315 End Time: 1400  Type of Therapy/Topic:  Group Therapy:  Feelings about Diagnosis  Participation Level:  Did Not Attend   Mood: X   Description of Group:    This group will allow patients to explore their thoughts and feelings about diagnoses they have received. Patients will be guided to explore their level of understanding and acceptance of these diagnoses. Facilitator will encourage patients to process their thoughts and feelings about the reactions of others to their diagnosis, and will guide patients in identifying ways to discuss their diagnosis with significant others in their lives. This group will be process-oriented, with patients participating in exploration of their own experiences as well as giving and receiving support and challenge from other group members.   Therapeutic Goals: 1. Patient will demonstrate understanding of diagnosis as evidence by identifying two or more symptoms of the disorder:  2. Patient will be able to express two feelings regarding the diagnosis 3. Patient will demonstrate ability to communicate their needs through discussion and/or role plays  Summary of Patient Progress:    X    Therapeutic Modalities:   Cognitive Behavioral Therapy Brief Therapy Feelings Identification    [name], LCSWA

## 2022-10-04 DIAGNOSIS — F3130 Bipolar disorder, current episode depressed, mild or moderate severity, unspecified: Secondary | ICD-10-CM | POA: Diagnosis not present

## 2022-10-04 MED ORDER — LOPERAMIDE HCL 2 MG PO CAPS
2.0000 mg | ORAL_CAPSULE | Freq: Once | ORAL | Status: AC
  Administered 2022-10-04: 2 mg via ORAL
  Filled 2022-10-04: qty 1

## 2022-10-04 MED ORDER — DIVALPROEX SODIUM 250 MG PO DR TAB
1000.0000 mg | DELAYED_RELEASE_TABLET | Freq: Two times a day (BID) | ORAL | Status: DC
  Administered 2022-10-04 – 2022-10-07 (×6): 1000 mg via ORAL
  Filled 2022-10-04 (×6): qty 4

## 2022-10-04 MED ORDER — PANTOPRAZOLE SODIUM 20 MG PO TBEC
20.0000 mg | DELAYED_RELEASE_TABLET | Freq: Two times a day (BID) | ORAL | Status: DC
  Administered 2022-10-04 – 2022-10-07 (×7): 20 mg via ORAL
  Filled 2022-10-04 (×8): qty 1

## 2022-10-04 NOTE — Progress Notes (Signed)

## 2022-10-04 NOTE — Group Note (Signed)
Recreation Therapy Group Note   Group Topic:Health and Wellness  Group Date: 10/04/2022 Start Time: 1400 End Time: 1445 Facilitators: Rosina Lowenstein, LRT, CTRS Location:  Dayroom  Group Description: Seated Exercise. LRT discussed the mental and physical benefits of exercise. LRT and group discussed how physical activity can be used as a coping skill. Pt's and LRT followed along to an exercise video on the TV screen that provided a visual representation and audio description of every exercise performed. Pt's encouraged to listen to their bodies and stop at any time if they experience feelings of discomfort or pain. LRT passed out water after session was over and encouraged pts do drink and stay hydrated.  Goal Area(s) Addressed: Patient will learn benefits of physical activity. Patient will identify exercise as a coping skill.  Patient will follow multistep directions. Patient will try a new leisure interest.    Affect/Mood: N/A   Participation Level: Did not attend    Clinical Observations/Individualized Feedback: [name] did not attend group.  Plan: Continue to engage patient in RT group sessions 2-3x/week.   Rosina Lowenstein, LRT, CTRS 10/04/2022 2:58 PM

## 2022-10-04 NOTE — Group Note (Signed)
Date:  10/04/2022 Time:  10:57 AM  Group Topic/Focus:  Gratitude Group The purpose of this group was to identity the meaning of gratitude following with a learning video about gratitude and being thankful for the people in our life. Lastly writing a letter to whomever they were thankful for.    Participation Level:  Did Not Attend  Participation Quality:    Affect:    Cognitive:    Insight:   Engagement in Group:    Modes of Intervention:    Additional Comments:  Did not attend  Dusti Tetro T Noe Gens 10/04/2022, 10:57 AM

## 2022-10-04 NOTE — Plan of Care (Signed)
  Problem: Education: Goal: Knowledge of General Education information will improve Description: Including pain rating scale, medication(s)/side effects and non-pharmacologic comfort measures Outcome: Progressing   Problem: Nutrition: Goal: Adequate nutrition will be maintained Outcome: Progressing   Problem: Safety: Goal: Ability to remain free from injury will improve Outcome: Progressing   Problem: Coping: Goal: Level of anxiety will decrease Outcome: Not Progressing

## 2022-10-04 NOTE — Progress Notes (Signed)
   10/04/22 2200  Psych Admission Type (Psych Patients Only)  Admission Status Voluntary  Psychosocial Assessment  Patient Complaints Anxiety;Depression  Eye Contact Fair  Facial Expression Anxious;Sad  Affect Anxious  Speech Logical/coherent  Interaction Assertive  Motor Activity Slow  Appearance/Hygiene In scrubs  Behavior Characteristics Cooperative;Anxious  Mood Anxious  Thought Process  Coherency WDL  Content WDL  Delusions None reported or observed  Perception Hallucinations  Hallucination Auditory  Judgment Impaired  Confusion None  Danger to Self  Current suicidal ideation? Denies  Self-Injurious Behavior No self-injurious ideation or behavior indicators observed or expressed   Agreement Not to Harm Self Yes  Description of Agreement verbal  Danger to Others  Danger to Others None reported or observed

## 2022-10-04 NOTE — Progress Notes (Signed)
Patient pleasant and cooperative.  Appropriate affect.  Reports he slept well.  Good appetite. Endorses anxiety and depression.  Endorses flashbacks  and AH at night.  Denies SI/HI and VH.  Patient returned to bed after breakfast.   Compliant with scheduled medications. 15 min checks in place for safety.  Minimal time in milieu, but appropriate interaction with peers and staff when present.  Patient did not participate in any groups.

## 2022-10-04 NOTE — Progress Notes (Signed)
[facility] MD Progress Note  10/04/2022 1:23 PM [name]  MRN:  [id number] Subjective: [name] is seen on rounds.  Some medication changes were made.  I looked up that his valproic acid level which was 28 and he is on 500 twice a day and is LFTs are within normal limits so I am going to go up on his Depakote since he is still having depression and mood swings and flashbacks.  The next step I told him would probably be switching it to lithium.  He states that he is feeling a little bit better. Principal Problem: Bipolar I disorder, most recent episode depressed (HCC) Diagnosis: Principal Problem:   Bipolar I disorder, most recent episode depressed (HCC) Active Problems:   PTSD (post-traumatic stress disorder)  Total Time spent with patient: 15 minutes  Past Psychiatric History: PTSD and depression  Past Medical History:  Past Medical History:  Diagnosis Date   Anxiety    Arthritis    knees and hands   Bipolar 1 disorder, depressed (HCC)    Bipolar disorder (HCC)    Depression    GERD (gastroesophageal reflux disease)    Hepatitis    HEP "C"   History of kidney stones    Hypertension    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 echo 5/17: EF 60-65%, no RWMA, LV dias fxn nl, LA mildly dilated, RV sys fxn nl, PASP nl, moderate sized circumferential pericardial effusion was identified, 2.12 cm around the LV free wall, <1 cm around the RV free wall. Features were not c/w tamponade physiology   PTSD (post-traumatic stress disorder)    Witnessed brother's suicide.   Restless leg syndrome    Seizures (HCC)    Syncope     Past Surgical History:  Procedure Laterality Date   CYSTOSCOPY WITH URETEROSCOPY AND STENT PLACEMENT     ESOPHAGOGASTRODUODENOSCOPY N/A 01/11/2016   Procedure: ESOPHAGOGASTRODUODENOSCOPY (EGD);  Surgeon: Charlott Rakes, MD;  Location: [facility] ENDOSCOPY;  Service: Endoscopy;  Laterality: N/A;    ESOPHAGOGASTRODUODENOSCOPY N/A 04/09/2020   Procedure: ESOPHAGOGASTRODUODENOSCOPY (EGD);  Surgeon: [name], MD;  Location: [facility] ENDOSCOPY;  Service: Gastroenterology;  Laterality: N/A;   INCISION AND DRAINAGE ABSCESS Left 01/02/2018   Procedure: INCISION AND DRAINAGE LEFT KNEE;  Surgeon: Deeann Saint, MD;  Location: [facility] ORS;  Service: Orthopedics;  Laterality: Left;   JOINT REPLACEMENT Right    TKR   KNEE ARTHROSCOPY Right 06/25/2014   Procedure: ARTHROSCOPY KNEE;  Surgeon: Deeann Saint, MD;  Location: [facility] ORS;  Service: Orthopedics;  Laterality: Right;  partial arthroscopic medial menisectomy   LAPAROSCOPIC APPENDECTOMY N/A 06/02/2021   Procedure: APPENDECTOMY LAPAROSCOPIC;  Surgeon: Campbell Lerner, MD;  Location: [facility] ORS;  Service: General;  Laterality: N/A;   TOTAL KNEE ARTHROPLASTY Right 04/22/2015   Procedure: TOTAL KNEE ARTHROPLASTY;  Surgeon: Deeann Saint, MD;  Location: [facility] ORS;  Service: Orthopedics;  Laterality: Right;   TOTAL KNEE ARTHROPLASTY Left 10/30/2017   Procedure: TOTAL KNEE ARTHROPLASTY;  Surgeon: Deeann Saint, MD;  Location: [facility] ORS;  Service: Orthopedics;  Laterality: Left;   TOTAL KNEE REVISION Left 01/02/2018   Procedure: poly exchange of tibia and patella left knee;  Surgeon: Deeann Saint, MD;  Location: [facility] ORS;  Service: Orthopedics;  Laterality: Left;   UMBILICAL HERNIA REPAIR  06/02/2021   Procedure: HERNIA REPAIR UMBILICAL ADULT;  Surgeon: Campbell Lerner, MD;  Location: [facility] ORS;  Service: General;;   Family History:  Family History  Problem Relation Age of Onset   CVA Mother        deceased at age 74   Depression Brother        Died by suicide at age 25   Family Psychiatric  History: Unremarkable Social History:  Social History   Substance and Sexual Activity  Alcohol Use Not Currently   Comment: rare     Social History   Substance and Sexual Activity  Drug Use Yes   Types: Marijuana, Cocaine    Social History   Socioeconomic  History   Marital status: Single    Spouse name: Not on file   Number of children: Not on file   Years of education: Not on file   Highest education level: Not on file  Occupational History   Not on file  Tobacco Use   Smoking status: Former    Current packs/day: 0.00    Average packs/day: 0.8 packs/day for 20.0 years (15.0 ttl pk-yrs)    Types: Cigarettes    Start date: 05/16/1964    Quit date: 05/16/1984    Years since quitting: 38.4   Smokeless tobacco: Never  Vaping Use   Vaping status: Never Used  Substance and Sexual Activity   Alcohol use: Not Currently    Comment: rare   Drug use: Yes    Types: Marijuana, Cocaine   Sexual activity: Not on file  Other Topics Concern   Not on file  Social History Narrative   ** Merged History Encounter **       Social Determinants of Health   Financial Resource Strain: Not on file  Food Insecurity: Food Insecurity Present (09/15/2022)   Hunger Vital Sign    Worried About Running Out of Food in the Last Year: Sometimes true    Ran Out of Food in the Last Year: Sometimes true  Transportation Needs: Unmet Transportation Needs (09/15/2022)   PRAPARE - Administrator, Civil Service (Medical): Yes    Lack of Transportation (Non-Medical): Yes  Physical Activity: Not on file  Stress: Not on file  Social Connections: Not on file   Additional Social History:                         Sleep: Good  Appetite:  Good  Current Medications: Current Facility-Administered Medications  Medication Dose Route Frequency Provider Last Rate Last Admin   acetaminophen (TYLENOL) tablet 650 mg  650 mg Oral Q6H PRN Onuoha, Chinwendu V, NP   650 mg at 10/03/22 1520   alum & mag hydroxide-simeth (MAALOX/MYLANTA) 200-200-20 MG/5ML suspension 30 mL  30 mL Oral Q4H PRN Onuoha, Chinwendu V, NP   30 mL at 09/28/22 1333   baclofen (LIORESAL) tablet 5 mg  5 mg Oral BID Lewanda Rife, MD   5 mg at 10/04/22 0900   haloperidol (HALDOL) tablet 5  mg  5 mg Oral Q6H PRN Jearld Lesch, NP   5 mg at 10/03/22 2256   And   benztropine (COGENTIN) tablet 1 mg  1 mg Oral Q6H PRN Jearld Lesch, NP   1 mg at 10/03/22 2256   buPROPion (WELLBUTRIN XL) 24 hr tablet 150 mg  150 mg Oral Daily Lewanda Rife, MD   150 mg at 10/04/22 0900   busPIRone (BUSPAR) tablet 5 mg  5 mg Oral BID Lewanda Rife, MD   5 mg at 10/04/22 0900   diphenhydrAMINE (BENADRYL) capsule 50 mg  50 mg Oral Q6H PRN  Jearld Lesch, NP   50 mg at 10/03/22 2256   Or   diphenhydrAMINE (BENADRYL) injection 50 mg  50 mg Intramuscular Q6H PRN Jearld Lesch, NP       divalproex (DEPAKOTE) DR tablet 1,000 mg  1,000 mg Oral Q12H Mekayla Soman Ramon Dredge, DO       haloperidol (HALDOL) tablet 5 mg  5 mg Oral TID PRN Onuoha, Chinwendu V, NP       Or   haloperidol lactate (HALDOL) injection 5 mg  5 mg Intramuscular TID PRN Onuoha, Chinwendu V, NP       hydrOXYzine (ATARAX) tablet 50 mg  50 mg Oral TID PRN Lewanda Rife, MD   50 mg at 10/04/22 0900   magnesium hydroxide (MILK OF MAGNESIA) suspension 30 mL  30 mL Oral Daily PRN Onuoha, Chinwendu V, NP       mirtazapine (REMERON) tablet 15 mg  15 mg Oral QHS Lewanda Rife, MD   15 mg at 10/03/22 2058   pantoprazole (PROTONIX) EC tablet 20 mg  20 mg Oral BID AC Lewanda Rife, MD   20 mg at 10/04/22 0732   QUEtiapine (SEROQUEL) tablet 400 mg  400 mg Oral Q supper Lewanda Rife, MD   400 mg at 10/03/22 2057   rOPINIRole (REQUIP) tablet 1 mg  1 mg Oral QHS Sarina Ill, DO   1 mg at 10/03/22 2057    Lab Results:  Results for orders placed or performed during the hospital encounter of 09/15/22 (from the past 48 hour(s))  Valproic acid level     Status: Abnormal   Collection Time: 10/03/22  7:09 AM  Result Value Ref Range   Valproic Acid Lvl 28 (L) 50.0 - 100.0 ug/mL    Comment: Performed at [facility], [address] [place], [place] [address]  AST     Status: None   Collection Time:  10/03/22  7:09 AM  Result Value Ref Range   AST 17 15 - 41 U/L    Comment: Performed at [facility], [address] [place], [place] [address]  ALT     Status: None   Collection Time: 10/03/22  7:09 AM  Result Value Ref Range   ALT 18 0 - 44 U/L    Comment: Performed at [facility], [address] [place], [place] [address]    Blood Alcohol level:  Lab Results  Component Value Date   [facility] <10 09/15/2022   ETH <10 07/05/2022    Metabolic Disorder Labs: Lab Results  Component Value Date   HGBA1C 5.5 04/16/2022   MPG 111 04/16/2022   MPG 105.41 01/18/2021   No results found for: "PROLACTIN" Lab Results  Component Value Date   CHOL 184 04/16/2022   TRIG 133 04/16/2022   HDL 32 (L) 04/16/2022   CHOLHDL 5.8 04/16/2022   VLDL 27 04/16/2022   LDLCALC 125 (H) 04/16/2022   LDLCALC 128 (H) 01/18/2021    Physical Findings: AIMS:  , ,  ,  ,    CIWA:    COWS:     Musculoskeletal: Strength & Muscle Tone: within normal limits Gait & Station: normal Patient leans: N/A  Psychiatric Specialty Exam:  Presentation  General Appearance:  Appropriate for Environment  Eye Contact: Fair  Speech: Clear and Coherent  Speech Volume: Normal  Handedness: Right   Mood and Affect  Mood: Anxious; Depressed  Affect: Congruent; Constricted   Thought Process  Thought Processes: Coherent; Goal Directed  Descriptions  of Associations:Intact  Orientation:Full (Time, Place and Person)  Thought Content:Abstract Reasoning; Logical; Rumination  History of Schizophrenia/Schizoaffective disorder:No  Duration of Psychotic Symptoms:Greater than six months  Hallucinations:No data recorded Ideas of Reference:None  Suicidal Thoughts:No data recorded Homicidal Thoughts:No data recorded  Sensorium  Memory: Immediate Fair; Recent Fair; Remote Fair  Judgment: Impaired  Insight: Shallow   Executive Functions  Concentration: Fair  Attention  Span: Fair  Recall: Fair  Fund of Knowledge: Good  Language: Fair   Psychomotor Activity  Psychomotor Activity:No data recorded  Assets  Assets: Communication Skills; Financial Resources/Insurance   Sleep  Sleep:No data recorded    Blood pressure 136/79, pulse 88, temperature 98.5 F (36.9 C), resp. rate 18, height 5\' 9"  (1.753 m), weight 79.8 kg, SpO2 96%. Body mass index is 25.99 kg/m.   Treatment Plan Summary: Daily contact with patient to assess and evaluate symptoms and progress in treatment, Medication management, and Plan increase Depakote to 1000 mg twice a day.  Sarina Ill, DO 10/04/2022, 1:23 PM

## 2022-10-05 DIAGNOSIS — F3130 Bipolar disorder, current episode depressed, mild or moderate severity, unspecified: Secondary | ICD-10-CM | POA: Diagnosis not present

## 2022-10-05 MED ORDER — QUETIAPINE FUMARATE 200 MG PO TABS
400.0000 mg | ORAL_TABLET | Freq: Every day | ORAL | Status: DC
  Administered 2022-10-05 – 2022-10-06 (×2): 400 mg via ORAL
  Filled 2022-10-05 (×2): qty 2

## 2022-10-05 MED ORDER — LOPERAMIDE HCL 2 MG PO CAPS
2.0000 mg | ORAL_CAPSULE | ORAL | Status: DC | PRN
  Administered 2022-10-05 – 2022-10-07 (×4): 2 mg via ORAL
  Filled 2022-10-05 (×4): qty 1

## 2022-10-05 NOTE — Group Note (Signed)
Date:  10/05/2022 Time:  5:35 PM  Group Topic/Focus:  Goals Group:   The focus of this group is to help patients establish daily goals to achieve during treatment and discuss how the patient can incorporate goal setting into their daily lives to aide in recovery.    Participation Level:  Did Not Attend  Participation Quality:    Affect:    Cognitive:    Insight:   Engagement in Group:    Modes of Intervention:    Additional Comments:  Did not attend  Ardelle Anton 10/05/2022, 5:35 PM

## 2022-10-05 NOTE — Progress Notes (Signed)
[facility] MD Progress Note  10/05/2022 1:42 PM [name]  MRN:  [id number] Subjective: [name] is seen on rounds.  Supposedly, he got food poisoning and has had GI problems.  He would like his Seroquel to be given at bedtime instead of at 5:00.  He states that his mood is improving.  He denies any suicidal ideation.  Nurses report no issues and we talked about discharge soon. Principal Problem: Bipolar I disorder, most recent episode depressed (HCC) Diagnosis: Principal Problem:   Bipolar I disorder, most recent episode depressed (HCC) Active Problems:   PTSD (post-traumatic stress disorder)  Total Time spent with patient: 15 minutes  Past Psychiatric History: Bipolar disorder  Past Medical History:  Past Medical History:  Diagnosis Date   Anxiety    Arthritis    knees and hands   Bipolar 1 disorder, depressed (HCC)    Bipolar disorder (HCC)    Depression    GERD (gastroesophageal reflux disease)    Hepatitis    HEP "C"   History of kidney stones    Hypertension    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 echo 5/17: EF 60-65%, no RWMA, LV dias fxn nl, LA mildly dilated, RV sys fxn nl, PASP nl, moderate sized circumferential pericardial effusion was identified, 2.12 cm around the LV free wall, <1 cm around the RV free wall. Features were not c/w tamponade physiology   PTSD (post-traumatic stress disorder)    Witnessed brother's suicide.   Restless leg syndrome    Seizures (HCC)    Syncope     Past Surgical History:  Procedure Laterality Date   CYSTOSCOPY WITH URETEROSCOPY AND STENT PLACEMENT     ESOPHAGOGASTRODUODENOSCOPY N/A 01/11/2016   Procedure: ESOPHAGOGASTRODUODENOSCOPY (EGD);  Surgeon: Charlott Rakes, MD;  Location: [facility] ENDOSCOPY;  Service: Endoscopy;  Laterality: N/A;   ESOPHAGOGASTRODUODENOSCOPY N/A 04/09/2020   Procedure: ESOPHAGOGASTRODUODENOSCOPY (EGD);  Surgeon: [name], MD;  Location: [facility]  ENDOSCOPY;  Service: Gastroenterology;  Laterality: N/A;   INCISION AND DRAINAGE ABSCESS Left 01/02/2018   Procedure: INCISION AND DRAINAGE LEFT KNEE;  Surgeon: Deeann Saint, MD;  Location: [facility] ORS;  Service: Orthopedics;  Laterality: Left;   JOINT REPLACEMENT Right    TKR   KNEE ARTHROSCOPY Right 06/25/2014   Procedure: ARTHROSCOPY KNEE;  Surgeon: Deeann Saint, MD;  Location: [facility] ORS;  Service: Orthopedics;  Laterality: Right;  partial arthroscopic medial menisectomy   LAPAROSCOPIC APPENDECTOMY N/A 06/02/2021   Procedure: APPENDECTOMY LAPAROSCOPIC;  Surgeon: Campbell Lerner, MD;  Location: [facility] ORS;  Service: General;  Laterality: N/A;   TOTAL KNEE ARTHROPLASTY Right 04/22/2015   Procedure: TOTAL KNEE ARTHROPLASTY;  Surgeon: Deeann Saint, MD;  Location: [facility] ORS;  Service: Orthopedics;  Laterality: Right;   TOTAL KNEE ARTHROPLASTY Left 10/30/2017   Procedure: TOTAL KNEE ARTHROPLASTY;  Surgeon: Deeann Saint, MD;  Location: [facility] ORS;  Service: Orthopedics;  Laterality: Left;   TOTAL KNEE REVISION Left 01/02/2018   Procedure: poly exchange of tibia and patella left knee;  Surgeon: Deeann Saint, MD;  Location: [facility] ORS;  Service: Orthopedics;  Laterality: Left;   UMBILICAL HERNIA REPAIR  06/02/2021   Procedure: HERNIA REPAIR UMBILICAL ADULT;  Surgeon: Campbell Lerner, MD;  Location: [facility] ORS;  Service: General;;   Family History:  Family History  Problem Relation Age of Onset   CVA Mother        deceased at age 34   Depression Brother  Died by suicide at age 1   Family Psychiatric  History: Unremarkable Social History:  Social History   Substance and Sexual Activity  Alcohol Use Not Currently   Comment: rare     Social History   Substance and Sexual Activity  Drug Use Yes   Types: Marijuana, Cocaine    Social History   Socioeconomic History   Marital status: Single    Spouse name: Not on file   Number of children: Not on file   Years of education: Not on file    Highest education level: Not on file  Occupational History   Not on file  Tobacco Use   Smoking status: Former    Current packs/day: 0.00    Average packs/day: 0.8 packs/day for 20.0 years (15.0 ttl pk-yrs)    Types: Cigarettes    Start date: 05/16/1964    Quit date: 05/16/1984    Years since quitting: 38.4   Smokeless tobacco: Never  Vaping Use   Vaping status: Never Used  Substance and Sexual Activity   Alcohol use: Not Currently    Comment: rare   Drug use: Yes    Types: Marijuana, Cocaine   Sexual activity: Not on file  Other Topics Concern   Not on file  Social History Narrative   ** Merged History Encounter **       Social Determinants of Health   Financial Resource Strain: Not on file  Food Insecurity: Food Insecurity Present (09/15/2022)   Hunger Vital Sign    Worried About Running Out of Food in the Last Year: Sometimes true    Ran Out of Food in the Last Year: Sometimes true  Transportation Needs: Unmet Transportation Needs (09/15/2022)   PRAPARE - Administrator, Civil Service (Medical): Yes    Lack of Transportation (Non-Medical): Yes  Physical Activity: Not on file  Stress: Not on file  Social Connections: Not on file   Additional Social History:                         Sleep: Good  Appetite:  Good  Current Medications: Current Facility-Administered Medications  Medication Dose Route Frequency Provider Last Rate Last Admin   acetaminophen (TYLENOL) tablet 650 mg  650 mg Oral Q6H PRN Onuoha, Chinwendu V, NP   650 mg at 10/04/22 2000   alum & mag hydroxide-simeth (MAALOX/MYLANTA) 200-200-20 MG/5ML suspension 30 mL  30 mL Oral Q4H PRN Onuoha, Chinwendu V, NP   30 mL at 10/04/22 2009   baclofen (LIORESAL) tablet 5 mg  5 mg Oral BID Lewanda Rife, MD   5 mg at 10/05/22 0934   haloperidol (HALDOL) tablet 5 mg  5 mg Oral Q6H PRN Jearld Lesch, NP   5 mg at 10/04/22 2117   And   benztropine (COGENTIN) tablet 1 mg  1 mg Oral Q6H PRN  Lerry Liner M, NP   1 mg at 10/03/22 2256   buPROPion (WELLBUTRIN XL) 24 hr tablet 150 mg  150 mg Oral Daily Lewanda Rife, MD   150 mg at 10/05/22 0935   busPIRone (BUSPAR) tablet 5 mg  5 mg Oral BID Lewanda Rife, MD   5 mg at 10/05/22 0934   diphenhydrAMINE (BENADRYL) capsule 50 mg  50 mg Oral Q6H PRN Jearld Lesch, NP   50 mg at 10/04/22 2117   Or   diphenhydrAMINE (BENADRYL) injection 50 mg  50 mg Intramuscular Q6H PRN Jearld Lesch, NP  divalproex (DEPAKOTE) DR tablet 1,000 mg  1,000 mg Oral Q12H Sarina Ill, DO   1,000 mg at 10/05/22 4540   haloperidol (HALDOL) tablet 5 mg  5 mg Oral TID PRN Onuoha, Chinwendu V, NP       Or   haloperidol lactate (HALDOL) injection 5 mg  5 mg Intramuscular TID PRN Onuoha, Chinwendu V, NP       hydrOXYzine (ATARAX) tablet 50 mg  50 mg Oral TID PRN Lewanda Rife, MD   50 mg at 10/04/22 2001   loperamide (IMODIUM) capsule 2 mg  2 mg Oral PRN Sarina Ill, DO   2 mg at 10/05/22 0934   magnesium hydroxide (MILK OF MAGNESIA) suspension 30 mL  30 mL Oral Daily PRN Onuoha, Chinwendu V, NP       mirtazapine (REMERON) tablet 15 mg  15 mg Oral QHS Lewanda Rife, MD   15 mg at 10/04/22 2118   pantoprazole (PROTONIX) EC tablet 20 mg  20 mg Oral BID AC Lewanda Rife, MD   20 mg at 10/05/22 0936   QUEtiapine (SEROQUEL) tablet 400 mg  400 mg Oral QHS Sarina Ill, DO       rOPINIRole (REQUIP) tablet 1 mg  1 mg Oral QHS Sarina Ill, DO   1 mg at 10/04/22 2115    Lab Results: No results found for this or any previous visit (from the past 48 hour(s)).  Blood Alcohol level:  Lab Results  Component Value Date   ETH <10 09/15/2022   ETH <10 07/05/2022    Metabolic Disorder Labs: Lab Results  Component Value Date   HGBA1C 5.5 04/16/2022   MPG 111 04/16/2022   MPG 105.41 01/18/2021   No results found for: "PROLACTIN" Lab Results  Component Value Date   CHOL 184 04/16/2022   TRIG 133  04/16/2022   HDL 32 (L) 04/16/2022   CHOLHDL 5.8 04/16/2022   VLDL 27 04/16/2022   LDLCALC 125 (H) 04/16/2022   LDLCALC 128 (H) 01/18/2021    Physical Findings: AIMS:  , ,  ,  ,    CIWA:    COWS:     Musculoskeletal: Strength & Muscle Tone: within normal limits Gait & Station: normal Patient leans: N/A  Psychiatric Specialty Exam:  Presentation  General Appearance:  Appropriate for Environment  Eye Contact: Fair  Speech: Clear and Coherent  Speech Volume: Normal  Handedness: Right   Mood and Affect  Mood: Anxious; Depressed  Affect: Congruent; Constricted   Thought Process  Thought Processes: Coherent; Goal Directed  Descriptions of Associations:Intact  Orientation:Full (Time, Place and Person)  Thought Content:Abstract Reasoning; Logical; Rumination  History of Schizophrenia/Schizoaffective disorder:No  Duration of Psychotic Symptoms:Greater than six months  Hallucinations:No data recorded Ideas of Reference:None  Suicidal Thoughts:No data recorded Homicidal Thoughts:No data recorded  Sensorium  Memory: Immediate Fair; Recent Fair; Remote Fair  Judgment: Impaired  Insight: Shallow   Executive Functions  Concentration: Fair  Attention Span: Fair  Recall: Fair  Fund of Knowledge: Good  Language: Fair   Psychomotor Activity  Psychomotor Activity:No data recorded  Assets  Assets: Communication Skills; Financial Resources/Insurance   Sleep  Sleep:No data recorded    Blood pressure 104/78, pulse (!) 109, temperature 98.4 F (36.9 C), resp. rate 16, height 5\' 9"  (1.753 m), weight 79.8 kg, SpO2 96%. Body mass index is 25.99 kg/m.   Treatment Plan Summary: Daily contact with patient to assess and evaluate symptoms and progress in treatment, Medication management, and Plan  change Seroquel to bedtime.  Allysia Ingles Tresea Mall, DO 10/05/2022, 1:42 PM

## 2022-10-05 NOTE — Group Note (Signed)
Date:  10/05/2022 Time:  12:20 AM  Group Topic/Focus:  Goals Group:   The focus of this group is to help patients establish daily goals to achieve during treatment and discuss how the patient can incorporate goal setting into their daily lives to aide in recovery.    Participation Level:  Active  Participation Quality:  Appropriate  Affect:  Appropriate  Cognitive:  Appropriate  Insight: Appropriate  Engagement in Group:  Engaged  Modes of Intervention:  Discussion  Additional Comments:    Maeola Harman 10/05/2022, 12:20 AM

## 2022-10-05 NOTE — Group Note (Signed)
Date:  10/05/2022 Time:  9:52 PM  Group Topic/Focus:  Recovery Goals:   The focus of this group is to identify appropriate goals for recovery and establish a plan to achieve them.    Participation Level:  Did Not Attend  Participation Quality:   Did Not Attend  Affect:   Did Not Attend  Cognitive:   Did Not Attend  Insight: None  Engagement in Group:  None  Modes of Intervention:   Did Not Attend  Additional Comments:    Garry Heater 10/05/2022, 9:52 PM

## 2022-10-05 NOTE — Group Note (Signed)
Date:  10/05/2022 Time:  5:05 PM  Group Topic/Focus:  Outside Rec/Music Therapy  The purpose of this group is to allow patients to get fresh air while listening to soothing music     Participation Level:  Did Not Attend  Participation Quality:    Affect:    Cognitive:    Insight:   Engagement in Group:    Modes of Intervention:    Additional Comments:  Did not attend  Nigil Braman T Mantaj Chamberlin 10/05/2022, 5:05 PM

## 2022-10-05 NOTE — Progress Notes (Signed)
   10/05/22 1100  Psych Admission Type (Psych Patients Only)  Admission Status Voluntary  Psychosocial Assessment  Patient Complaints Depression  Eye Contact Fair  Facial Expression Flat  Affect Flat  Speech Logical/coherent  Interaction Assertive  Motor Activity Slow  Appearance/Hygiene In scrubs  Behavior Characteristics Cooperative  Mood Sad  Thought Process  Coherency WDL  Content WDL  Delusions None reported or observed  Perception Hallucinations  Hallucination Auditory  Judgment Impaired  Confusion None  Danger to Self  Current suicidal ideation? Denies  Agreement Not to Harm Self Yes  Danger to Others  Danger to Others None reported or observed   Patient c/o diarrhea, refused breakfast and lunch. Ate dinner. Medicated with  imodium as ordered. Hydrated multiple times with gatorade.

## 2022-10-05 NOTE — Progress Notes (Signed)
   10/05/22 0600  15 Minute Checks  Location Bedroom  Visual Appearance Calm  Behavior Sleeping  Sleep (Behavioral Health Patients Only)  Calculate sleep? (Click Yes once per 24 hr at 0600 safety check) Yes  Documented sleep last 24 hours 6.25

## 2022-10-06 DIAGNOSIS — F3130 Bipolar disorder, current episode depressed, mild or moderate severity, unspecified: Secondary | ICD-10-CM | POA: Diagnosis not present

## 2022-10-06 NOTE — Progress Notes (Signed)
   10/05/22 2300  Psych Admission Type (Psych Patients Only)  Admission Status Voluntary  Psychosocial Assessment  Patient Complaints Depression;Malaise  Eye Contact Fair  Facial Expression Flat  Affect Flat  Speech Logical/coherent  Interaction Minimal  Motor Activity Slow  Appearance/Hygiene In scrubs  Behavior Characteristics Cooperative  Mood Depressed  Thought Process  Coherency WDL  Content WDL  Delusions None reported or observed  Perception Hallucinations  Hallucination Auditory  Judgment Impaired  Confusion None  Danger to Self  Current suicidal ideation? Denies  Agreement Not to Harm Self Yes  Description of Agreement verbal  Danger to Others  Danger to Others None reported or observed

## 2022-10-06 NOTE — Progress Notes (Signed)
[facility] MD Progress Note  10/06/2022 1:51 PM [name]  MRN:  [id number] Subjective: [name] is seen on rounds.  He states that he feels good enough to leave tomorrow.  His nausea and vomiting have subsided.  He states that his mood is improved.  Nurses report no issues.  He denies any side effects from his medications.  There is no evidence of EPS or TD.  He denies any suicidal or homicidal ideation. Principal Problem: Bipolar I disorder, most recent episode depressed (HCC) Diagnosis: Principal Problem:   Bipolar I disorder, most recent episode depressed (HCC) Active Problems:   PTSD (post-traumatic stress disorder)  Total Time spent with patient: 15 minutes  Past Psychiatric History: Bipolar depression  Past Medical History:  Past Medical History:  Diagnosis Date   Anxiety    Arthritis    knees and hands   Bipolar 1 disorder, depressed (HCC)    Bipolar disorder (HCC)    Depression    GERD (gastroesophageal reflux disease)    Hepatitis    HEP "C"   History of kidney stones    Hypertension    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 echo 5/17: EF 60-65%, no RWMA, LV dias fxn nl, LA mildly dilated, RV sys fxn nl, PASP nl, moderate sized circumferential pericardial effusion was identified, 2.12 cm around the LV free wall, <1 cm around the RV free wall. Features were not c/w tamponade physiology   PTSD (post-traumatic stress disorder)    Witnessed brother's suicide.   Restless leg syndrome    Seizures (HCC)    Syncope     Past Surgical History:  Procedure Laterality Date   CYSTOSCOPY WITH URETEROSCOPY AND STENT PLACEMENT     ESOPHAGOGASTRODUODENOSCOPY N/A 01/11/2016   Procedure: ESOPHAGOGASTRODUODENOSCOPY (EGD);  Surgeon: Charlott Rakes, MD;  Location: [facility] ENDOSCOPY;  Service: Endoscopy;  Laterality: N/A;   ESOPHAGOGASTRODUODENOSCOPY N/A 04/09/2020   Procedure: ESOPHAGOGASTRODUODENOSCOPY (EGD);  Surgeon: [name], MD;  Location: [facility] ENDOSCOPY;  Service: Gastroenterology;  Laterality: N/A;   INCISION AND DRAINAGE ABSCESS Left 01/02/2018   Procedure: INCISION AND DRAINAGE LEFT KNEE;  Surgeon: Deeann Saint, MD;  Location: [facility] ORS;  Service: Orthopedics;  Laterality: Left;   JOINT REPLACEMENT Right    TKR   KNEE ARTHROSCOPY Right 06/25/2014   Procedure: ARTHROSCOPY KNEE;  Surgeon: Deeann Saint, MD;  Location: [facility] ORS;  Service: Orthopedics;  Laterality: Right;  partial arthroscopic medial menisectomy   LAPAROSCOPIC APPENDECTOMY N/A 06/02/2021   Procedure: APPENDECTOMY LAPAROSCOPIC;  Surgeon: Campbell Lerner, MD;  Location: [facility] ORS;  Service: General;  Laterality: N/A;   TOTAL KNEE ARTHROPLASTY Right 04/22/2015   Procedure: TOTAL KNEE ARTHROPLASTY;  Surgeon: Deeann Saint, MD;  Location: [facility] ORS;  Service: Orthopedics;  Laterality: Right;   TOTAL KNEE ARTHROPLASTY Left 10/30/2017   Procedure: TOTAL KNEE ARTHROPLASTY;  Surgeon: Deeann Saint, MD;  Location: [facility] ORS;  Service: Orthopedics;  Laterality: Left;   TOTAL KNEE REVISION Left 01/02/2018   Procedure: poly exchange of tibia and patella left knee;  Surgeon: Deeann Saint, MD;  Location: [facility] ORS;  Service: Orthopedics;  Laterality: Left;   UMBILICAL HERNIA REPAIR  06/02/2021   Procedure: HERNIA REPAIR UMBILICAL ADULT;  Surgeon: Campbell Lerner, MD;  Location: [facility] ORS;  Service: General;;   Family History:  Family History  Problem Relation Age of Onset   CVA Mother        deceased at age 34   Depression  Brother        Died by suicide at age 15   Family Psychiatric  History: Unremarkable Social History:  Social History   Substance and Sexual Activity  Alcohol Use Not Currently   Comment: rare     Social History   Substance and Sexual Activity  Drug Use Yes   Types: Marijuana, Cocaine    Social History   Socioeconomic History   Marital status: Single    Spouse name: Not on file   Number of children: Not on file    Years of education: Not on file   Highest education level: Not on file  Occupational History   Not on file  Tobacco Use   Smoking status: Former    Current packs/day: 0.00    Average packs/day: 0.8 packs/day for 20.0 years (15.0 ttl pk-yrs)    Types: Cigarettes    Start date: 05/16/1964    Quit date: 05/16/1984    Years since quitting: 38.4   Smokeless tobacco: Never  Vaping Use   Vaping status: Never Used  Substance and Sexual Activity   Alcohol use: Not Currently    Comment: rare   Drug use: Yes    Types: Marijuana, Cocaine   Sexual activity: Not on file  Other Topics Concern   Not on file  Social History Narrative   ** Merged History Encounter **       Social Determinants of Health   Financial Resource Strain: Not on file  Food Insecurity: Food Insecurity Present (09/15/2022)   Hunger Vital Sign    Worried About Running Out of Food in the Last Year: Sometimes true    Ran Out of Food in the Last Year: Sometimes true  Transportation Needs: Unmet Transportation Needs (09/15/2022)   PRAPARE - Administrator, Civil Service (Medical): Yes    Lack of Transportation (Non-Medical): Yes  Physical Activity: Not on file  Stress: Not on file  Social Connections: Not on file   Additional Social History:                         Sleep: Good  Appetite:  Good  Current Medications: Current Facility-Administered Medications  Medication Dose Route Frequency Provider Last Rate Last Admin   acetaminophen (TYLENOL) tablet 650 mg  650 mg Oral Q6H PRN Onuoha, Chinwendu V, NP   650 mg at 10/04/22 2000   alum & mag hydroxide-simeth (MAALOX/MYLANTA) 200-200-20 MG/5ML suspension 30 mL  30 mL Oral Q4H PRN Onuoha, Chinwendu V, NP   30 mL at 10/04/22 2009   baclofen (LIORESAL) tablet 5 mg  5 mg Oral BID Lewanda Rife, MD   5 mg at 10/06/22 0919   haloperidol (HALDOL) tablet 5 mg  5 mg Oral Q6H PRN Jearld Lesch, NP   5 mg at 10/04/22 2117   And   benztropine (COGENTIN)  tablet 1 mg  1 mg Oral Q6H PRN Jearld Lesch, NP   1 mg at 10/05/22 2058   buPROPion (WELLBUTRIN XL) 24 hr tablet 150 mg  150 mg Oral Daily Lewanda Rife, MD   150 mg at 10/06/22 0918   busPIRone (BUSPAR) tablet 5 mg  5 mg Oral BID Lewanda Rife, MD   5 mg at 10/06/22 0919   diphenhydrAMINE (BENADRYL) capsule 50 mg  50 mg Oral Q6H PRN Jearld Lesch, NP   50 mg at 10/04/22 2117   Or   diphenhydrAMINE (BENADRYL) injection 50 mg  50  mg Intramuscular Q6H PRN Jearld Lesch, NP       divalproex (DEPAKOTE) DR tablet 1,000 mg  1,000 mg Oral Q12H Sarina Ill, DO   1,000 mg at 10/06/22 0920   haloperidol (HALDOL) tablet 5 mg  5 mg Oral TID PRN Onuoha, Chinwendu V, NP       Or   haloperidol lactate (HALDOL) injection 5 mg  5 mg Intramuscular TID PRN Onuoha, Chinwendu V, NP       hydrOXYzine (ATARAX) tablet 50 mg  50 mg Oral TID PRN Lewanda Rife, MD   50 mg at 10/05/22 2059   loperamide (IMODIUM) capsule 2 mg  2 mg Oral PRN Sarina Ill, DO   2 mg at 10/05/22 2059   magnesium hydroxide (MILK OF MAGNESIA) suspension 30 mL  30 mL Oral Daily PRN Onuoha, Chinwendu V, NP       mirtazapine (REMERON) tablet 15 mg  15 mg Oral QHS Lewanda Rife, MD   15 mg at 10/05/22 2102   pantoprazole (PROTONIX) EC tablet 20 mg  20 mg Oral BID AC Lewanda Rife, MD   20 mg at 10/06/22 0742   QUEtiapine (SEROQUEL) tablet 400 mg  400 mg Oral QHS Sarina Ill, DO   400 mg at 10/05/22 2059   rOPINIRole (REQUIP) tablet 1 mg  1 mg Oral QHS Sarina Ill, DO   1 mg at 10/05/22 2058    Lab Results: No results found for this or any previous visit (from the past 48 hour(s)).  Blood Alcohol level:  Lab Results  Component Value Date   ETH <10 09/15/2022   ETH <10 07/05/2022    Metabolic Disorder Labs: Lab Results  Component Value Date   HGBA1C 5.5 04/16/2022   MPG 111 04/16/2022   MPG 105.41 01/18/2021   No results found for: "PROLACTIN" Lab Results   Component Value Date   CHOL 184 04/16/2022   TRIG 133 04/16/2022   HDL 32 (L) 04/16/2022   CHOLHDL 5.8 04/16/2022   VLDL 27 04/16/2022   LDLCALC 125 (H) 04/16/2022   LDLCALC 128 (H) 01/18/2021    Physical Findings: AIMS:  , ,  ,  ,    CIWA:    COWS:     Musculoskeletal: Strength & Muscle Tone: within normal limits Gait & Station: normal Patient leans: N/A  Psychiatric Specialty Exam:  Presentation  General Appearance:  Appropriate for Environment  Eye Contact: Fair  Speech: Clear and Coherent  Speech Volume: Normal  Handedness: Right   Mood and Affect  Mood: Anxious; Depressed  Affect: Congruent; Constricted   Thought Process  Thought Processes: Coherent; Goal Directed  Descriptions of Associations:Intact  Orientation:Full (Time, Place and Person)  Thought Content:Abstract Reasoning; Logical; Rumination  History of Schizophrenia/Schizoaffective disorder:No  Duration of Psychotic Symptoms:Greater than six months  Hallucinations:No data recorded Ideas of Reference:None  Suicidal Thoughts:No data recorded Homicidal Thoughts:No data recorded  Sensorium  Memory: Immediate Fair; Recent Fair; Remote Fair  Judgment: Impaired  Insight: Shallow   Executive Functions  Concentration: Fair  Attention Span: Fair  Recall: Fair  Fund of Knowledge: Good  Language: Fair   Psychomotor Activity  Psychomotor Activity:No data recorded  Assets  Assets: Communication Skills; Financial Resources/Insurance   Sleep  Sleep:No data recorded    Blood pressure 96/75, pulse 100, temperature (!) 97.5 F (36.4 C), resp. rate 16, height 5\' 9"  (1.753 m), weight 79.8 kg, SpO2 96%. Body mass index is 25.99 kg/m.   Treatment Plan Summary:  Daily contact with patient to assess and evaluate symptoms and progress in treatment, Medication management, and Plan continue current medications.  Debbie Yearick [place], DO 10/06/2022, 1:51 PM

## 2022-10-06 NOTE — Progress Notes (Signed)
   10/06/22 0559  15 Minute Checks  Location Bedroom  Visual Appearance Calm  Behavior Sleeping  Sleep (Behavioral Health Patients Only)  Calculate sleep? (Click Yes once per 24 hr at 0600 safety check) Yes  Documented sleep last 24 hours 9.25

## 2022-10-06 NOTE — Progress Notes (Signed)
Patient is a voluntary admit to [facility] for [facility] and PTSD. Patient is calm, cooperative, engages with staff and peers. Stays in his room most of the time isolating. Had no complaints today and declined atarax and tylenol this am. Did receive later in the day with imodium for c/o diarrhea. Plan is to discharge tomorrow. Patient denies SI, HI, AVH, Depression but endorses Anxiety. Will continue to monitor.

## 2022-10-06 NOTE — Group Note (Signed)
Recreation Therapy Group Note   Group Topic:Coping Skills  Group Date: 10/06/2022 Start Time: 1000 End Time: 1050 Facilitators: Rosina Lowenstein, LRT, CTRS Location: Courtyard  Group Description: Music Reminisce. LRT encouraged patients to think of their favorite song(s) that reminded them of a positive memory or time in their life. LRT encouraged patient to talk about that memory aloud to the group. LRT played the song through a speaker for all to hear. LRT and patients discussed how thinking of a positive memory or time in their life can be used as a coping skill in everyday life post discharge.    Goal Area(s) Addressed: Patient will increase verbal communication by conversing with peers. Patient will contribute to group discussion with minimal prompting. Patient will reminisce a positive memory or moment in their life.    Affect/Mood: N/A   Participation Level: Did not attend    Clinical Observations/Individualized Feedback: Olly did not attend group.  Plan: Continue to engage patient in RT group sessions 2-3x/week.   Rosina Lowenstein, LRT, CTRS 10/06/2022 11:53 AM

## 2022-10-06 NOTE — BH IP Treatment Plan (Signed)
Interdisciplinary Treatment and Diagnostic Plan Update  10/06/2022 Time of Session: 9:30 AM  [name] MRN: [id number]  Principal Diagnosis: Bipolar I disorder, most recent episode depressed (HCC)  Secondary Diagnoses: Principal Problem:   Bipolar I disorder, most recent episode depressed (HCC) Active Problems:   PTSD (post-traumatic stress disorder)   Current Medications:  Current Facility-Administered Medications  Medication Dose Route Frequency Provider Last Rate Last Admin   acetaminophen (TYLENOL) tablet 650 mg  650 mg Oral Q6H PRN Onuoha, Chinwendu V, NP   650 mg at 10/04/22 2000   alum & mag hydroxide-simeth (MAALOX/MYLANTA) 200-200-20 MG/5ML suspension 30 mL  30 mL Oral Q4H PRN Onuoha, Chinwendu V, NP   30 mL at 10/04/22 2009   baclofen (LIORESAL) tablet 5 mg  5 mg Oral BID Lewanda Rife, MD   5 mg at 10/06/22 0919   haloperidol (HALDOL) tablet 5 mg  5 mg Oral Q6H PRN Jearld Lesch, NP   5 mg at 10/04/22 2117   And   benztropine (COGENTIN) tablet 1 mg  1 mg Oral Q6H PRN Jearld Lesch, NP   1 mg at 10/05/22 2058   buPROPion (WELLBUTRIN XL) 24 hr tablet 150 mg  150 mg Oral Daily Lewanda Rife, MD   150 mg at 10/06/22 0918   busPIRone (BUSPAR) tablet 5 mg  5 mg Oral BID Lewanda Rife, MD   5 mg at 10/06/22 0919   diphenhydrAMINE (BENADRYL) capsule 50 mg  50 mg Oral Q6H PRN Jearld Lesch, NP   50 mg at 10/04/22 2117   Or   diphenhydrAMINE (BENADRYL) injection 50 mg  50 mg Intramuscular Q6H PRN Jearld Lesch, NP       divalproex (DEPAKOTE) DR tablet 1,000 mg  1,000 mg Oral Q12H Sarina Ill, DO   1,000 mg at 10/06/22 0920   haloperidol (HALDOL) tablet 5 mg  5 mg Oral TID PRN Onuoha, Chinwendu V, NP       Or   haloperidol lactate (HALDOL) injection 5 mg  5 mg Intramuscular TID PRN Onuoha, Chinwendu V, NP       hydrOXYzine (ATARAX) tablet 50 mg  50 mg Oral TID PRN Lewanda Rife, MD   50 mg at 10/05/22 2059   loperamide (IMODIUM) capsule  2 mg  2 mg Oral PRN Sarina Ill, DO   2 mg at 10/05/22 2059   magnesium hydroxide (MILK OF MAGNESIA) suspension 30 mL  30 mL Oral Daily PRN Onuoha, Chinwendu V, NP       mirtazapine (REMERON) tablet 15 mg  15 mg Oral QHS Lewanda Rife, MD   15 mg at 10/05/22 2102   pantoprazole (PROTONIX) EC tablet 20 mg  20 mg Oral BID AC Lewanda Rife, MD   20 mg at 10/06/22 0742   QUEtiapine (SEROQUEL) tablet 400 mg  400 mg Oral QHS Sarina Ill, DO   400 mg at 10/05/22 2059   rOPINIRole (REQUIP) tablet 1 mg  1 mg Oral QHS Sarina Ill, DO   1 mg at 10/05/22 2058   PTA Medications: Medications Prior to Admission  Medication Sig Dispense Refill Last Dose   butalbital-acetaminophen-caffeine (FIORICET) 50-325-40 MG tablet Take 1 tablet by mouth every 6 (six) hours as needed for headache or migraine.      divalproex (DEPAKOTE) 500 MG DR tablet Take 1 tablet (500 mg total) by mouth 2 (two) times daily. 60 tablet 3    doxepin (SINEQUAN) 100 MG capsule Take 100 mg by  mouth at bedtime.      enalapril (VASOTEC) 20 MG tablet Take 20 mg by mouth daily. (Patient not taking: Reported on 09/15/2022)      enalapril (VASOTEC) 20 MG tablet Take 1 tablet (20 mg total) by mouth daily. (Patient not taking: Reported on 09/15/2022) 30 tablet 3    FLUoxetine (PROZAC) 20 MG capsule Take 1 capsule (20 mg total) by mouth daily. (Patient not taking: Reported on 09/15/2022) 30 capsule 0    FLUoxetine (PROZAC) 20 MG capsule Take 1 capsule (20 mg total) by mouth daily. (Patient not taking: Reported on 09/15/2022) 30 capsule 3    haloperidol (HALDOL) 5 MG tablet Take 5 mg by mouth every 6 (six) hours as needed for agitation. (Patient not taking: Reported on 09/15/2022)      metoprolol succinate (TOPROL-XL) 50 MG 24 hr tablet Take 1 tablet (50 mg total) by mouth daily. 30 tablet 3    risperiDONE (RISPERDAL) 1 MG tablet Take 1 tablet (1 mg total) by mouth 2 (two) times daily at 8 am and 4 pm. (Patient not  taking: Reported on 09/15/2022) 60 tablet 3    traZODone (DESYREL) 100 MG tablet Take 1 tablet (100 mg total) by mouth at bedtime as needed for sleep. 30 tablet 3    traZODone (DESYREL) 50 MG tablet Take 1 tablet (50 mg total) by mouth at bedtime as needed (Anxiety). (Patient not taking: Reported on 09/15/2022) 30 tablet 0    zolpidem (AMBIEN) 10 MG tablet Take 10 mg by mouth at bedtime.       Patient Stressors: Medication change or noncompliance    Patient Strengths: Ability for insight  Capable of independent living  Manufacturing systems engineer  Motivation for treatment/growth   Treatment Modalities: Medication Management, Group therapy, Case management,  1 to 1 session with clinician, Psychoeducation, Recreational therapy.   Physician Treatment Plan for Primary Diagnosis: Bipolar I disorder, most recent episode depressed (HCC) Long Term Goal(s): Improvement in symptoms so as ready for discharge   Short Term Goals: Ability to identify changes in lifestyle to reduce recurrence of condition will improve Ability to verbalize feelings will improve Ability to disclose and discuss suicidal ideas Ability to demonstrate self-control will improve Ability to identify and develop effective coping behaviors will improve Ability to maintain clinical measurements within normal limits will improve Compliance with prescribed medications will improve Ability to identify triggers associated with substance abuse/mental health issues will improve  Medication Management: Evaluate patient's response, side effects, and tolerance of medication regimen.  Therapeutic Interventions: 1 to 1 sessions, Unit Group sessions and Medication administration.  Evaluation of Outcomes: Progressing  Physician Treatment Plan for Secondary Diagnosis: Principal Problem:   Bipolar I disorder, most recent episode depressed (HCC) Active Problems:   PTSD (post-traumatic stress disorder)  Long Term Goal(s): Improvement in symptoms  so as ready for discharge   Short Term Goals: Ability to identify changes in lifestyle to reduce recurrence of condition will improve Ability to verbalize feelings will improve Ability to disclose and discuss suicidal ideas Ability to demonstrate self-control will improve Ability to identify and develop effective coping behaviors will improve Ability to maintain clinical measurements within normal limits will improve Compliance with prescribed medications will improve Ability to identify triggers associated with substance abuse/mental health issues will improve     Medication Management: Evaluate patient's response, side effects, and tolerance of medication regimen.  Therapeutic Interventions: 1 to 1 sessions, Unit Group sessions and Medication administration.  Evaluation of Outcomes: Progressing   RN  Treatment Plan for Primary Diagnosis: Bipolar I disorder, most recent episode depressed (HCC) Long Term Goal(s): Knowledge of disease and therapeutic regimen to maintain health will improve  Short Term Goals: Ability to remain free from injury will improve, Ability to verbalize frustration and anger appropriately will improve, Ability to demonstrate self-control, Ability to participate in decision making will improve, Ability to verbalize feelings will improve, Ability to disclose and discuss suicidal ideas, Ability to identify and develop effective coping behaviors will improve, and Compliance with prescribed medications will improve  Medication Management: RN will administer medications as ordered by provider, will assess and evaluate patient's response and provide education to patient for prescribed medication. RN will report any adverse and/or side effects to prescribing provider.  Therapeutic Interventions: 1 on 1 counseling sessions, Psychoeducation, Medication administration, Evaluate responses to treatment, Monitor vital signs and CBGs as ordered, Perform/monitor CIWA, COWS, AIMS and  Fall Risk screenings as ordered, Perform wound care treatments as ordered.  Evaluation of Outcomes: Progressing   LCSW Treatment Plan for Primary Diagnosis: Bipolar I disorder, most recent episode depressed (HCC) Long Term Goal(s): Safe transition to appropriate next level of care at discharge, Engage patient in therapeutic group addressing interpersonal concerns.  Short Term Goals: Engage patient in aftercare planning with referrals and resources, Increase social support, Increase ability to appropriately verbalize feelings, Increase emotional regulation, Facilitate acceptance of mental health diagnosis and concerns, Facilitate patient progression through stages of change regarding substance use diagnoses and concerns, Identify triggers associated with mental health/substance abuse issues, and Increase skills for wellness and recovery  Therapeutic Interventions: Assess for all discharge needs, 1 to 1 time with Social worker, Explore available resources and support systems, Assess for adequacy in community support network, Educate family and significant other(s) on suicide prevention, Complete Psychosocial Assessment, Interpersonal group therapy.  Evaluation of Outcomes: Progressing   Progress in Treatment: Attending groups: No. Participating in groups: No. Taking medication as prescribed: Yes. Toleration medication: Yes. Family/Significant other contact made: No, will contact:  CSW will contact if given permission  Patient understands diagnosis: Yes. Discussing patient identified problems/goals with staff: Yes. Medical problems stabilized or resolved: Yes. Denies suicidal/homicidal ideation: Yes. Issues/concerns per patient self-inventory: No. Other: None   New problem(s) identified: No, Describe:  None identified Update 10/06/22: No changes at this time    New Short Term/Long Term Goal(s): d elimination of symptoms of psychosis, medication management for mood stabilization; elimination  of SI thoughts; development of comprehensive mental wellness plan. Update 10/06/22: No changes at this time     Patient Goals:  " I don't have any goals" Update 09/21/22: No changes at this time Update 09/26/2022: No changes at this time Update 10/06/22: No changes at this time    Discharge Plan or Barriers: CSW will assist with appropriate discharge planning Update 10/06/22: No changes at this time    Reason for Continuation of Hospitalization: Depression Medication stabilization   Estimated Length of Stay: 1 to 7 days Update 10/06/22: No changes at this time   Last 3 [place] Suicide Severity Risk Score: Flowsheet Row Admission (Current) from 09/15/2022 in [facility] BEHAVIORAL MEDICINE Most recent reading at 09/15/2022 10:00 PM ED from 09/15/2022 in [facility] Emergency Department at [facility] Most recent reading at 09/15/2022  5:26 PM Admission (Discharged) from 07/06/2022 in [facility] INPATIENT BEHAVIORAL MEDICINE Most recent reading at 07/06/2022  3:14 PM  C-SSRS RISK CATEGORY Moderate Risk High Risk No Risk       Last PHQ 2/9 Scores:  No data to display          Scribe for Treatment Team: Laretta Alstrom 10/06/2022 3:27 PM

## 2022-10-07 DIAGNOSIS — F3130 Bipolar disorder, current episode depressed, mild or moderate severity, unspecified: Secondary | ICD-10-CM | POA: Diagnosis not present

## 2022-10-07 MED ORDER — ROPINIROLE HCL 1 MG PO TABS: 1.0000 mg | ORAL_TABLET | Freq: Every day | ORAL | 3 refills | Status: DC

## 2022-10-07 MED ORDER — BENZTROPINE MESYLATE 1 MG PO TABS: 1.0000 mg | ORAL_TABLET | Freq: Four times a day (QID) | ORAL | 0 refills | Status: DC | PRN

## 2022-10-07 MED ORDER — HALOPERIDOL 5 MG PO TABS: 5.0000 mg | ORAL_TABLET | Freq: Three times a day (TID) | ORAL | 0 refills | Status: DC | PRN

## 2022-10-07 MED ORDER — MIRTAZAPINE 15 MG PO TABS: 15.0000 mg | ORAL_TABLET | Freq: Every day | ORAL | 3 refills | Status: DC

## 2022-10-07 MED ORDER — BUPROPION HCL ER (XL) 150 MG PO TB24: 150.0000 mg | ORAL_TABLET | Freq: Every day | ORAL | 3 refills | Status: DC

## 2022-10-07 MED ORDER — DIVALPROEX SODIUM 500 MG PO DR TAB: 1000.0000 mg | DELAYED_RELEASE_TABLET | Freq: Two times a day (BID) | ORAL | 3 refills | Status: DC

## 2022-10-07 MED ORDER — PANTOPRAZOLE SODIUM 20 MG PO TBEC: 20.0000 mg | DELAYED_RELEASE_TABLET | Freq: Two times a day (BID) | ORAL | 3 refills | Status: DC

## 2022-10-07 MED ORDER — QUETIAPINE FUMARATE 400 MG PO TABS: 400.0000 mg | ORAL_TABLET | Freq: Every day | ORAL | 3 refills | Status: DC

## 2022-10-07 MED ORDER — BUSPIRONE HCL 5 MG PO TABS: 5.0000 mg | ORAL_TABLET | Freq: Two times a day (BID) | ORAL | 3 refills | Status: DC

## 2022-10-07 MED ORDER — BACLOFEN 5 MG PO TABS: 5.0000 mg | ORAL_TABLET | Freq: Two times a day (BID) | ORAL | 0 refills | Status: DC
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
## 2022-10-07 NOTE — [facility] Suicide Risk Assessment (Signed)
[facility] INPATIENT:  Family/Significant Other Suicide Prevention Education  Suicide Prevention Education:  Patient Refusal for Family/Significant Other Suicide Prevention Education: The patient [name] has refused to provide written consent for family/significant other to be provided Family/Significant Other Suicide Prevention Education during admission and/or prior to discharge.  Physician notified.  [name] 10/07/2022, 10:36 AM

## 2022-10-07 NOTE — Group Note (Signed)
Date:  10/07/2022 Time:  12:52 AM  Group Topic/Focus:  Goals Group:   The focus of this group is to help patients establish daily goals to achieve during treatment and discuss how the patient can incorporate goal setting into their daily lives to aide in recovery.    Participation Level:  Active  Participation Quality:  Appropriate  Affect:  Appropriate  Cognitive:  Appropriate  Insight: Appropriate and Good  Engagement in Group:  Engaged  Modes of Intervention:  Discussion  Additional Comments:    [name] 10/07/2022, 12:52 AM

## 2022-10-07 NOTE — Group Note (Signed)
Date:  10/07/2022 Time:  11:07 AM  Group Topic/Focus:  Building Self Esteem:   The Focus of this group is helping patients become aware of the effects of self-esteem on their lives, the things they and others do that enhance or undermine their self-esteem, seeing the relationship between their level of self-esteem and the choices they make and learning ways to enhance self-esteem.    Participation Level:  Did Not Attend  Participation Quality:    Affect:    Cognitive:    Insight:   Engagement in Group:    Modes of Intervention:    Additional Comments:    Ardelle Anton 10/07/2022, 11:07 AM

## 2022-10-07 NOTE — Progress Notes (Signed)
Hydroxyzine 50 mg adm for complaints of 8/10 anxiety. Imodium 2 mg adm for complaints of loose stools. Upon follow up, both medications provided relief.

## 2022-10-07 NOTE — Plan of Care (Signed)
  Problem: Education: Goal: Knowledge of General Education information will improve Description: Including pain rating scale, medication(s)/side effects and non-pharmacologic comfort measures Outcome: Adequate for Discharge   Problem: Health Behavior/Discharge Planning: Goal: Ability to manage health-related needs will improve Outcome: Adequate for Discharge   Problem: Clinical Measurements: Goal: Ability to maintain clinical measurements within normal limits will improve Outcome: Adequate for Discharge Goal: Will remain free from infection Outcome: Adequate for Discharge Goal: Diagnostic test results will improve Outcome: Adequate for Discharge Goal: Respiratory complications will improve Outcome: Adequate for Discharge Goal: Cardiovascular complication will be avoided Outcome: Adequate for Discharge   Problem: Activity: Goal: Risk for activity intolerance will decrease Outcome: Adequate for Discharge   Problem: Nutrition: Goal: Adequate nutrition will be maintained Outcome: Adequate for Discharge   Problem: Coping: Goal: Level of anxiety will decrease Outcome: Adequate for Discharge   Problem: Elimination: Goal: Will not experience complications related to bowel motility Outcome: Adequate for Discharge Goal: Will not experience complications related to urinary retention Outcome: Adequate for Discharge   Problem: Pain Managment: Goal: General experience of comfort will improve Outcome: Adequate for Discharge   Problem: Safety: Goal: Ability to remain free from injury will improve Outcome: Adequate for Discharge   Problem: Skin Integrity: Goal: Risk for impaired skin integrity will decrease Outcome: Adequate for Discharge   Problem: Education: Goal: Ability to state activities that reduce stress will improve Outcome: Adequate for Discharge

## 2022-10-07 NOTE — [facility] Suicide Risk Assessment (Signed)
[facility] Discharge Suicide Risk Assessment   Principal Problem: Bipolar I disorder, most recent episode depressed (HCC) Discharge Diagnoses: Principal Problem:   Bipolar I disorder, most recent episode depressed (HCC) Active Problems:   PTSD (post-traumatic stress disorder)   Total Time spent with patient: 1 hour  Musculoskeletal: Strength & Muscle Tone: within normal limits Gait & Station: normal Patient leans: N/A  Psychiatric Specialty Exam  Presentation  General Appearance:  Appropriate for Environment  Eye Contact: Fair  Speech: Clear and Coherent  Speech Volume: Normal  Handedness: Right   Mood and Affect  Mood: Anxious; Depressed  Duration of Depression Symptoms: Greater than two weeks  Affect: Congruent; Constricted   Thought Process  Thought Processes: Coherent; Goal Directed  Descriptions of Associations:Intact  Orientation:Full (Time, Place and Person)  Thought Content:Abstract Reasoning; Logical; Rumination  History of Schizophrenia/Schizoaffective disorder:No  Duration of Psychotic Symptoms:Greater than six months  Hallucinations:No data recorded Ideas of Reference:None  Suicidal Thoughts:No data recorded Homicidal Thoughts:No data recorded  Sensorium  Memory: Immediate Fair; Recent Fair; Remote Fair  Judgment: Impaired  Insight: Shallow   Executive Functions  Concentration: Fair  Attention Span: Fair  Recall: Fair  Fund of Knowledge: Good  Language: Fair   Psychomotor Activity  Psychomotor Activity:No data recorded  Assets  Assets: Communication Skills; Financial Resources/Insurance   Sleep  Sleep:No data recorded   Blood pressure 106/76, pulse (!) 110, temperature 97.9 F (36.6 C), resp. rate 16, height 5\' 9"  (1.753 m), weight 79.8 kg, SpO2 98%. Body mass index is 25.99 kg/m.  Mental Status Per Nursing Assessment::   On Admission:  Suicidal ideation indicated by patient  Demographic Factors:   Male and Caucasian  Loss Factors: NA  Historical Factors: NA  Risk Reduction Factors:   NA  Continued Clinical Symptoms:  Depression:   Anhedonia  Cognitive Features That Contribute To Risk:  None    Suicide Risk:  Minimal: No identifiable suicidal ideation.  Patients presenting with no risk factors but with morbid ruminations; may be classified as minimal risk based on the severity of the depressive symptoms    Plan Of Care/Follow-up recommendations: See SW Note   Sarina Ill, DO 10/07/2022, 10:05 AM

## 2022-10-07 NOTE — Progress Notes (Signed)
Patient ID: [name], male   DOB: March 18, 1960, 62 y.o.   MRN: [id number]  Patient was discharged from [facility] unit at approx 1340 escorted by staff. Patient denies SI/HI/AVH. Discharge packet to include printed AVS, Suicide Risk Assessment, and Transition Record reviewed with patient. Belongingsto include cell phone, EBT and debit card returned and patient verified receipt with signature. Suicide safety plan completed with a copy kept in chart.

## 2022-10-07 NOTE — Progress Notes (Signed)
   10/07/22 0601  15 Minute Checks  Location Bedroom  Visual Appearance Calm  Behavior Sleeping  Sleep (Behavioral Health Patients Only)  Calculate sleep? (Click Yes once per 24 hr at 0600 safety check) Yes  Documented sleep last 24 hours 12.5

## 2022-10-07 NOTE — Progress Notes (Signed)
   10/06/22 2300  Psych Admission Type (Psych Patients Only)  Admission Status Voluntary  Psychosocial Assessment  Patient Complaints Depression  Eye Contact Fair  Facial Expression Flat  Affect Flat  Speech Logical/coherent  Interaction Assertive  Motor Activity Slow  Appearance/Hygiene In scrubs  Behavior Characteristics Cooperative  Mood Pleasant  Thought Process  Coherency WDL  Content WDL  Delusions None reported or observed  Perception Hallucinations  Hallucination Auditory  Judgment Impaired  Confusion None  Danger to Self  Current suicidal ideation? Denies  Agreement Not to Harm Self Yes  Description of Agreement verbal  Danger to Others  Danger to Others None reported or observed

## 2022-10-07 NOTE — Progress Notes (Signed)
  [facility] Adult Case Management Discharge Plan :  Will you be returning to the same living situation after discharge:  Yes,  pt will return home  At discharge, do you have transportation home?: Yes,  CSW will provide taxi voucher  Do you have the ability to pay for your medications: Yes,   [employer] / [employer]  Release of information consent forms completed and in the chart;  Patient's signature needed at discharge.  Patient to Follow up at:  Follow-up Information     [facility], [facility]. Go to.   Why: Your appoinmtment is scheduled for 10/11/22 at 11:00 AM. Please remember to bring your insurance card. Contact information: [address] [place] [address] [phone]                 Next level of care provider has access to [facility] Link:no  Safety Planning and Suicide Prevention discussed: Yes,  pt declined, however CSW went over SPE brochure with pt and it was given at discharge      Has patient been referred to the Quitline?: Patient refused referral for treatment  Patient has been referred for addiction treatment: No known substance use disorder.  [address] LCSWA 10/07/2022, 10:21 AM

## 2022-12-18 ENCOUNTER — Encounter: Payer: Self-pay | Admitting: Psychiatry

## 2022-12-18 ENCOUNTER — Emergency Department
Admission: EM | Admit: 2022-12-18 | Discharge: 2022-12-18 | Disposition: A | Discharge status: Other Acute Inpatient Hospital | Payer: MEDICAID | Attending: Emergency Medicine | Admitting: Emergency Medicine

## 2022-12-18 ENCOUNTER — Inpatient Hospital Stay
Admission: AD | Admit: 2022-12-18 | Discharge: 2022-12-20 | DRG: 885 | Disposition: A | Discharge status: Other Acute Inpatient Hospital | Payer: MEDICAID | Source: Intra-hospital | Attending: Psychiatry | Admitting: Psychiatry

## 2022-12-18 ENCOUNTER — Other Ambulatory Visit: Payer: Self-pay

## 2022-12-18 ENCOUNTER — Encounter: Payer: Self-pay | Admitting: Radiology

## 2022-12-18 DIAGNOSIS — F332 Major depressive disorder, recurrent severe without psychotic features: Secondary | ICD-10-CM | POA: Diagnosis present

## 2022-12-18 DIAGNOSIS — F3130 Bipolar disorder, current episode depressed, mild or moderate severity, unspecified: Secondary | ICD-10-CM

## 2022-12-18 DIAGNOSIS — Z818 Family history of other mental and behavioral disorders: Secondary | ICD-10-CM | POA: Diagnosis not present

## 2022-12-18 DIAGNOSIS — I483 Typical atrial flutter: Secondary | ICD-10-CM | POA: Diagnosis not present

## 2022-12-18 DIAGNOSIS — Z823 Family history of stroke: Secondary | ICD-10-CM | POA: Diagnosis not present

## 2022-12-18 DIAGNOSIS — F410 Panic disorder [episodic paroxysmal anxiety] without agoraphobia: Secondary | ICD-10-CM | POA: Diagnosis present

## 2022-12-18 DIAGNOSIS — I10 Essential (primary) hypertension: Secondary | ICD-10-CM | POA: Diagnosis present

## 2022-12-18 DIAGNOSIS — Z96653 Presence of artificial knee joint, bilateral: Secondary | ICD-10-CM | POA: Diagnosis present

## 2022-12-18 DIAGNOSIS — Z87891 Personal history of nicotine dependence: Secondary | ICD-10-CM | POA: Diagnosis not present

## 2022-12-18 DIAGNOSIS — R45851 Suicidal ideations: Secondary | ICD-10-CM | POA: Insufficient documentation

## 2022-12-18 DIAGNOSIS — R9431 Abnormal electrocardiogram [ECG] [EKG]: Secondary | ICD-10-CM | POA: Diagnosis not present

## 2022-12-18 DIAGNOSIS — Z5941 Food insecurity: Secondary | ICD-10-CM | POA: Diagnosis not present

## 2022-12-18 DIAGNOSIS — I25118 Atherosclerotic heart disease of native coronary artery with other forms of angina pectoris: Secondary | ICD-10-CM | POA: Diagnosis not present

## 2022-12-18 DIAGNOSIS — I4892 Unspecified atrial flutter: Secondary | ICD-10-CM | POA: Diagnosis not present

## 2022-12-18 DIAGNOSIS — Z8619 Personal history of other infectious and parasitic diseases: Secondary | ICD-10-CM | POA: Diagnosis not present

## 2022-12-18 DIAGNOSIS — F3176 Bipolar disorder, in full remission, most recent episode depressed: Secondary | ICD-10-CM | POA: Insufficient documentation

## 2022-12-18 DIAGNOSIS — Z87442 Personal history of urinary calculi: Secondary | ICD-10-CM

## 2022-12-18 DIAGNOSIS — I471 Supraventricular tachycardia, unspecified: Secondary | ICD-10-CM | POA: Diagnosis not present

## 2022-12-18 DIAGNOSIS — R7989 Other specified abnormal findings of blood chemistry: Secondary | ICD-10-CM | POA: Diagnosis not present

## 2022-12-18 DIAGNOSIS — R440 Auditory hallucinations: Secondary | ICD-10-CM | POA: Insufficient documentation

## 2022-12-18 DIAGNOSIS — Z5982 Transportation insecurity: Secondary | ICD-10-CM

## 2022-12-18 DIAGNOSIS — F4310 Post-traumatic stress disorder, unspecified: Secondary | ICD-10-CM | POA: Diagnosis present

## 2022-12-18 DIAGNOSIS — R339 Retention of urine, unspecified: Secondary | ICD-10-CM | POA: Diagnosis not present

## 2022-12-18 DIAGNOSIS — E876 Hypokalemia: Secondary | ICD-10-CM | POA: Diagnosis not present

## 2022-12-18 DIAGNOSIS — G2581 Restless legs syndrome: Secondary | ICD-10-CM | POA: Diagnosis present

## 2022-12-18 DIAGNOSIS — F1490 Cocaine use, unspecified, uncomplicated: Secondary | ICD-10-CM | POA: Diagnosis not present

## 2022-12-18 DIAGNOSIS — F29 Unspecified psychosis not due to a substance or known physiological condition: Secondary | ICD-10-CM | POA: Diagnosis not present

## 2022-12-18 DIAGNOSIS — F329 Major depressive disorder, single episode, unspecified: Secondary | ICD-10-CM | POA: Diagnosis not present

## 2022-12-18 DIAGNOSIS — E8721 Acute metabolic acidosis: Secondary | ICD-10-CM | POA: Diagnosis not present

## 2022-12-18 LAB — URINE DRUG SCREEN, QUALITATIVE (ARMC ONLY)
Amphetamines, Ur Screen: NOT DETECTED
Barbiturates, Ur Screen: NOT DETECTED
Benzodiazepine, Ur Scrn: NOT DETECTED
Cannabinoid 50 Ng, Ur ~~LOC~~: NOT DETECTED
Cocaine Metabolite,Ur ~~LOC~~: NOT DETECTED
MDMA (Ecstasy)Ur Screen: NOT DETECTED
Methadone Scn, Ur: NOT DETECTED
Opiate, Ur Screen: NOT DETECTED
Phencyclidine (PCP) Ur S: NOT DETECTED
Tricyclic, Ur Screen: NOT DETECTED

## 2022-12-18 LAB — COMPREHENSIVE METABOLIC PANEL
ALT: 20 U/L (ref 0–44)
AST: 22 U/L (ref 15–41)
Albumin: 4.7 g/dL (ref 3.5–5.0)
Alkaline Phosphatase: 79 U/L (ref 38–126)
Anion gap: 15 (ref 5–15)
BUN: 16 mg/dL (ref 6–20)
CO2: 19 mmol/L — ABNORMAL LOW (ref 22–32)
Calcium: 9.9 mg/dL (ref 8.9–10.3)
Chloride: 103 mmol/L (ref 98–111)
Creatinine, Ser: 1.1 mg/dL (ref 0.61–1.24)
GFR, Estimated: >60 mL/min (ref >60)
Glucose, Bld: 112 mg/dL — ABNORMAL HIGH (ref 70–99)
Potassium: 3.9 mmol/L (ref 3.5–5.1)
Sodium: 137 mmol/L (ref 135–145)
Total Bilirubin: 1.2 mg/dL — ABNORMAL HIGH (ref <1.2)
Total Protein: 8.4 g/dL — ABNORMAL HIGH (ref 6.5–8.1)

## 2022-12-18 LAB — CBC
HCT: 48.1 % (ref 39.0–52.0)
Hemoglobin: 16.4 g/dL (ref 13.0–17.0)
MCH: 30 pg (ref 26.0–34.0)
MCHC: 34.1 g/dL (ref 30.0–36.0)
MCV: 87.9 fL (ref 80.0–100.0)
Platelets: 270 10*3/uL (ref 150–400)
RBC: 5.47 MIL/uL (ref 4.22–5.81)
RDW: 13.1 % (ref 11.5–15.5)
WBC: 8.2 10*3/uL (ref 4.0–10.5)
nRBC: 0 % (ref 0.0–0.2)

## 2022-12-18 LAB — SALICYLATE LEVEL: Salicylate Lvl: <7 mg/dL — ABNORMAL LOW (ref 7.0–30.0)

## 2022-12-18 LAB — ACETAMINOPHEN LEVEL: Acetaminophen (Tylenol), Serum: <10 ug/mL — ABNORMAL LOW (ref 10–30)

## 2022-12-18 LAB — ETHANOL: Alcohol, Ethyl (B): <10 mg/dL (ref <10)

## 2022-12-18 MED ORDER — IBUPROFEN 600 MG PO TABS: 600.0000 mg | ORAL_TABLET | Freq: Three times a day (TID) | ORAL | Status: DC | PRN

## 2022-12-18 MED ORDER — ROPINIROLE HCL 1 MG PO TABS
1.0000 mg | ORAL_TABLET | Freq: Every day | ORAL | Status: DC
  Administered 2022-12-18 – 2022-12-19 (×2): 1 mg via ORAL
  Filled 2022-12-18 (×2): qty 1

## 2022-12-18 MED ORDER — DIVALPROEX SODIUM 500 MG PO DR TAB: 1000.0000 mg | DELAYED_RELEASE_TABLET | Freq: Two times a day (BID) | ORAL | Status: DC

## 2022-12-18 MED ORDER — HALOPERIDOL 5 MG PO TABS: 5.0000 mg | ORAL_TABLET | Freq: Three times a day (TID) | ORAL | Status: DC | PRN

## 2022-12-18 MED ORDER — BACLOFEN 5 MG PO TABS: 5.0000 mg | ORAL_TABLET | Freq: Two times a day (BID) | ORAL | Status: DC

## 2022-12-18 MED ORDER — DIVALPROEX SODIUM 500 MG PO DR TAB
1000.0000 mg | DELAYED_RELEASE_TABLET | Freq: Two times a day (BID) | ORAL | Status: DC
  Administered 2022-12-18 – 2022-12-19 (×3): 1000 mg via ORAL
  Filled 2022-12-18 (×4): qty 2

## 2022-12-18 MED ORDER — DIPHENHYDRAMINE HCL 50 MG/ML IJ SOLN: 50.0000 mg | Freq: Two times a day (BID) | INTRAMUSCULAR | Status: DC | PRN

## 2022-12-18 MED ORDER — DIPHENHYDRAMINE HCL 25 MG PO CAPS: 50.0000 mg | ORAL_CAPSULE | Freq: Two times a day (BID) | ORAL | Status: DC | PRN

## 2022-12-18 MED ORDER — ALUM & MAG HYDROXIDE-SIMETH 200-200-20 MG/5ML PO SUSP: 30.0000 mL | Freq: Four times a day (QID) | ORAL | Status: DC | PRN

## 2022-12-18 MED ORDER — MIRTAZAPINE 15 MG PO TABS
15.0000 mg | ORAL_TABLET | Freq: Every day | ORAL | Status: DC
  Administered 2022-12-18 – 2022-12-19 (×2): 15 mg via ORAL
  Filled 2022-12-18 (×2): qty 1

## 2022-12-18 MED ORDER — BUSPIRONE HCL 10 MG PO TABS: 5.0000 mg | ORAL_TABLET | Freq: Two times a day (BID) | ORAL | Status: DC

## 2022-12-18 MED ORDER — ALUM & MAG HYDROXIDE-SIMETH 200-200-20 MG/5ML PO SUSP
30.0000 mL | ORAL | Status: DC | PRN
  Administered 2022-12-18: 30 mL via ORAL
  Filled 2022-12-18: qty 30

## 2022-12-18 MED ORDER — PANTOPRAZOLE SODIUM 20 MG PO TBEC: 20.0000 mg | DELAYED_RELEASE_TABLET | Freq: Two times a day (BID) | ORAL | Status: DC

## 2022-12-18 MED ORDER — BUPROPION HCL ER (XL) 150 MG PO TB24
150.0000 mg | ORAL_TABLET | Freq: Every day | ORAL | Status: DC
  Administered 2022-12-19: 150 mg via ORAL
  Filled 2022-12-18: qty 1

## 2022-12-18 MED ORDER — ROPINIROLE HCL 1 MG PO TABS: 1.0000 mg | ORAL_TABLET | Freq: Every day | ORAL | Status: DC

## 2022-12-18 MED ORDER — OLANZAPINE 10 MG IM SOLR: 5.0000 mg | Freq: Two times a day (BID) | INTRAMUSCULAR | Status: DC | PRN

## 2022-12-18 MED ORDER — MIRTAZAPINE 15 MG PO TABS: 15.0000 mg | ORAL_TABLET | Freq: Every day | ORAL | Status: DC

## 2022-12-18 MED ORDER — ACETAMINOPHEN 325 MG PO TABS
650.0000 mg | ORAL_TABLET | Freq: Four times a day (QID) | ORAL | Status: DC | PRN
  Administered 2022-12-18 – 2022-12-19 (×2): 650 mg via ORAL
  Filled 2022-12-18 (×2): qty 2

## 2022-12-18 MED ORDER — ONDANSETRON HCL 4 MG PO TABS: 4.0000 mg | ORAL_TABLET | Freq: Three times a day (TID) | ORAL | Status: DC | PRN

## 2022-12-18 MED ORDER — BACLOFEN 10 MG PO TABS
5.0000 mg | ORAL_TABLET | Freq: Two times a day (BID) | ORAL | Status: DC
  Administered 2022-12-18 – 2022-12-19 (×3): 5 mg via ORAL
  Filled 2022-12-18 (×5): qty 0.5

## 2022-12-18 MED ORDER — MAGNESIUM HYDROXIDE 400 MG/5ML PO SUSP: 30.0000 mL | Freq: Every day | ORAL | Status: DC | PRN

## 2022-12-18 MED ORDER — BUSPIRONE HCL 5 MG PO TABS
5.0000 mg | ORAL_TABLET | Freq: Two times a day (BID) | ORAL | Status: DC
  Administered 2022-12-18 – 2022-12-19 (×3): 5 mg via ORAL
  Filled 2022-12-18 (×4): qty 1

## 2022-12-18 MED ORDER — ONDANSETRON HCL 4 MG PO TABS
4.0000 mg | ORAL_TABLET | Freq: Three times a day (TID) | ORAL | Status: DC | PRN
Start: 2022-12-18 — End: 2022-12-18

## 2022-12-18 MED ORDER — BUPROPION HCL ER (XL) 150 MG PO TB24: 150.0000 mg | ORAL_TABLET | Freq: Every day | ORAL | Status: DC

## 2022-12-18 MED ORDER — QUETIAPINE FUMARATE 200 MG PO TABS: 400.0000 mg | ORAL_TABLET | Freq: Every day | ORAL | Status: DC

## 2022-12-18 MED ORDER — QUETIAPINE FUMARATE 200 MG PO TABS: 200.0000 mg | ORAL_TABLET | Freq: Every day | ORAL | Status: DC

## 2022-12-18 MED ORDER — OLANZAPINE 5 MG PO TABS: 5.0000 mg | ORAL_TABLET | Freq: Two times a day (BID) | ORAL | Status: DC | PRN

## 2022-12-18 MED ORDER — QUETIAPINE FUMARATE 200 MG PO TABS
200.0000 mg | ORAL_TABLET | Freq: Every day | ORAL | Status: DC
Start: 2022-12-18 — End: 2022-12-20
  Administered 2022-12-18 – 2022-12-19 (×2): 200 mg via ORAL
  Filled 2022-12-18 (×2): qty 1

## 2022-12-18 MED ORDER — ENSURE ENLIVE PO LIQD: 237.0000 mL | Freq: Two times a day (BID) | ORAL | Status: DC

## 2022-12-18 MED ORDER — PANTOPRAZOLE SODIUM 20 MG PO TBEC
20.0000 mg | DELAYED_RELEASE_TABLET | Freq: Two times a day (BID) | ORAL | Status: DC
  Administered 2022-12-19: 20 mg via ORAL
  Filled 2022-12-18 (×3): qty 1

## 2022-12-18 MED ORDER — BENZTROPINE MESYLATE 1 MG PO TABS
1.0000 mg | ORAL_TABLET | Freq: Four times a day (QID) | ORAL | Status: DC | PRN
Start: 2022-12-18 — End: 2022-12-20
  Administered 2022-12-20: 1 mg via ORAL
  Filled 2022-12-18: qty 1

## 2022-12-18 MED ORDER — BENZTROPINE MESYLATE 1 MG PO TABS: 1.0000 mg | ORAL_TABLET | Freq: Four times a day (QID) | ORAL | Status: DC | PRN

## 2022-12-18 NOTE — ED Notes (Signed)
Attempted to call report-no answer 

## 2022-12-18 NOTE — ED Provider Notes (Signed)
[facility] Provider Note    Event Date/Time   First MD Initiated Contact with Patient 12/18/22 1557     (approximate)   History   Chief Complaint: Suicidal   HPI  [name] is a 62 y.o. male who presents with SI, with a plan to hang himself.  Has been off of his antidepressants for a week.  Also reports command auditory hallucinations which tell him to kill himself.  Also reports visual hallucinations of his deceased brother.  Reports poor sleep, poor appetite.  No pain or injuries.  No fever or recent illness.      Physical Exam   Triage Vital Signs: ED Triage Vitals [12/18/22 1431]  Encounter Vitals Group     BP      Systolic BP Percentile      Diastolic BP Percentile      Pulse      Resp      Temp      Temp src      SpO2      Weight 180 lb (81.6 kg)     Height 5\' 9"  (1.753 m)     Head Circumference      Peak Flow      Pain Score      Pain Loc      Pain Education      Exclude from Growth Chart     Most recent vital signs: There were no vitals filed for this visit.  General: Awake, no distress. CV:  Good peripheral perfusion.  Resp:  Normal effort.  Abd:  No distention.  Other:  No wounds.  Appears uneasy, tense affect   ED Results / Procedures / Treatments   Labs (all labs ordered are listed, but only abnormal results are displayed) Labs Reviewed  COMPREHENSIVE METABOLIC PANEL - Abnormal; Notable for the following components:      Result Value   CO2 19 (*)    Glucose, Bld 112 (*)    Total Protein 8.4 (*)    Total Bilirubin 1.2 (*)    All other components within normal limits  ETHANOL  CBC  URINE DRUG SCREEN, QUALITATIVE ([facility] ONLY)  SALICYLATE LEVEL  ACETAMINOPHEN LEVEL     EKG    RADIOLOGY    PROCEDURES:  Procedures   MEDICATIONS ORDERED IN ED: Medications  ibuprofen (ADVIL) tablet 600 mg (has no administration in time range)  ondansetron (ZOFRAN) tablet 4 mg (has no administration in time  range)  alum & mag hydroxide-simeth (MAALOX/MYLANTA) 200-200-20 MG/5ML suspension 30 mL (has no administration in time range)     IMPRESSION / MDM / ASSESSMENT AND PLAN / ED COURSE  I reviewed the triage vital signs and the nursing notes.  Patient's presentation is most consistent with acute presentation with potential threat to life or bodily function.  Patient presents with SI, with a plan to hang himself.  Has been off of his antidepressants for a week.  Also reports command auditory hallucinations which tell him to kill himself.  Also reports visual hallucinations of his deceased brother.  He is medically stable.  Will consult psychiatry and initiate IVC.  The patient has been placed in psychiatric observation due to the need to provide a safe environment for the patient while obtaining psychiatric consultation and evaluation, as well as ongoing medical and medication management to treat the patient's condition.  The patient has been placed under full IVC at this time.      FINAL CLINICAL IMPRESSION(S) /  ED DIAGNOSES   Final diagnoses:  Suicidal ideation  Auditory hallucination     Rx / DC Orders   ED Discharge Orders     None        Note:  This document was prepared using Dragon voice recognition software and may include unintentional dictation errors.   Sharman Cheek, MD 12/18/22 1600

## 2022-12-18 NOTE — ED Triage Notes (Signed)
Pants with belt Data processing manager    Cell phone

## 2022-12-18 NOTE — BH Assessment (Signed)
Patient is to be admitted to [facility] BMU tonight 12/18/22 after 8:30pm by Dr.  Marlou Porch .  Attending Physician will be Dr. Marlou Porch.   Patient has been assigned to room 325, by [facility] Charge Nurse, Dairl Ponder.    ER staff is aware of the admission: Ronnie, ER Secretary   Dr. Scotty Court, ER MD  [facility] Patient's Nurse

## 2022-12-18 NOTE — ED Notes (Signed)
[facility]. Patient place in burgundy scrubs and wanded.

## 2022-12-18 NOTE — BH Assessment (Signed)
Comprehensive Clinical Assessment (CCA) Note  12/18/2022 [facility] Sherr [id number]  [name], 62 year old male who presents to [facility] ED involuntarily for treatment. Per triage note, Pt states he has been out of his medication for about a week and a half and is now having suicidal thoughts. Pt states his plan would be to hang himself.   During TTS assessment pt presents alert and oriented x 4, restless but cooperative, and mood-congruent with affect. The pt does not appear to be responding to internal or external stimuli. Neither is the pt presenting with any delusional thinking. Pt verified the information provided to triage RN.   Pt identifies his main complaint to be that he has been having suicidal thoughts for the past week. Patient reports he ran out of his meds even though he followed with his appointment at [facility]. Patient reports increased depression and anxiety with frequent panic attacks. Patient states he has not been sleeping well; barely getting 4 hours. Patient says his appetite is good. Patient denies using any illicit substances and alcohol. Patient denies current HI/AH/VH.    Per Asher Muir, NP, pt is recommended for inpatient psychiatric admission.  Chief Complaint:  Chief Complaint  Patient presents with   Suicidal   Visit Diagnosis: Bipolar I disorder. Most recent episode depressed    CCA Screening, Triage and Referral (STR)  Patient Reported Information How did you hear about [place]? Self  Referral name: No data recorded Referral phone number: No data recorded  Whom do you see for routine medical problems? No data recorded Practice/Facility Name: No data recorded Practice/Facility Phone Number: No data recorded Name of Contact: No data recorded Contact Number: No data recorded Contact Fax Number: No data recorded Prescriber Name: No data recorded Prescriber Address (if known): No data recorded  What Is the Reason for Your Visit/Call Today? Suicidal thoughts  How  Long Has This Been Causing You Problems? > than 6 months  What Do You Feel Would Help You the Most Today? Treatment for Depression or other mood problem; Medication(s)   Have You Recently Been in Any Inpatient Treatment (Hospital/Detox/Crisis Center/28-Day Program)? No data recorded Name/Location of Program/Hospital:No data recorded How Long Were You There? No data recorded When Were You Discharged? No data recorded  Have You Ever Received Services From [facility] Before? No data recorded Who Do You See at [facility]? No data recorded  Have You Recently Had Any Thoughts About Hurting Yourself? Yes  Are You Planning to Commit Suicide/Harm Yourself At This time? No   Have you Recently Had Thoughts About Hurting Someone Karolee Ohs? No  Explanation: No data recorded  Have You Used Any Alcohol or Drugs in the Past 24 Hours? No  How Long Ago Did You Use Drugs or Alcohol? No data recorded What Did You Use and How Much? Marijuana   Do You Currently Have a Therapist/Psychiatrist? No  Name of Therapist/Psychiatrist: [facility]   Have You Been Recently Discharged From Any Office Practice or Programs? No  Explanation of Discharge From Practice/Program: No data recorded    CCA Screening Triage Referral Assessment Type of Contact: Face-to-Face  Is this Initial or Reassessment? No data recorded Date Telepsych consult ordered in [facility]:  No data recorded Time Telepsych consult ordered in [facility]:  No data recorded  Patient Reported Information Reviewed? No data recorded Patient Left Without Being Seen? No data recorded Reason for Not Completing Assessment: No data recorded  Collateral Involvement: None provided   Does Patient Have a Court Appointed  Legal Guardian? No data recorded Name and Contact of Legal Guardian: No data recorded If Minor and Not Living with Parent(s), Who has Custody? No data recorded Is CPS involved or ever been involved? Never  Is APS involved or ever been involved?  Never   Patient Determined To Be At Risk for Harm To Self or Others Based on Review of Patient Reported Information or Presenting Complaint? Yes, for Self-Harm  Method: Plan without intent  Availability of Means: No access or NA  Intent: Vague intent or NA  Notification Required: No need or identified person  Additional Information for Danger to Others Potential: No data recorded Additional Comments for Danger to Others Potential: No data recorded Are There Guns or Other Weapons in Your Home? No  Types of Guns/Weapons: No data recorded Are These Weapons Safely Secured?                            No  Who Could Verify You Are Able To Have These Secured: No data recorded Do You Have any Outstanding Charges, Pending Court Dates, Parole/Probation? No data recorded Contacted To Inform of Risk of Harm To Self or Others: No data recorded  Location of Assessment: [facility] ED   Does Patient Present under Involuntary Commitment? Yes  IVC Papers Initial File Date: No data recorded  [place] of Residence: [place]   Patient Currently Receiving the Following Services: Individual Therapy   Determination of Need: Emergent (2 hours)   Options For Referral: ED Visit; Inpatient Hospitalization; Medication Management     CCA Biopsychosocial Intake/Chief Complaint:  No data recorded Current Symptoms/Problems: No data recorded  Patient Reported Schizophrenia/Schizoaffective Diagnosis in Past: No   Strengths: Some insight, polite and seeking help.  Preferences: No data recorded Abilities: No data recorded  Type of Services Patient Feels are Needed: No data recorded  Initial Clinical Notes/Concerns: No data recorded  Mental Health Symptoms Depression:   Change in energy/activity; Difficulty Concentrating; Sleep (too much or little)   Duration of Depressive symptoms:  Greater than two weeks   Mania:   N/A   Anxiety:    Difficulty concentrating; Sleep   Psychosis:   None    Duration of Psychotic symptoms:  Greater than six months   Trauma:   N/A   Obsessions:   N/A   Compulsions:   N/A   Inattention:   N/A   Hyperactivity/Impulsivity:   N/A   Oppositional/Defiant Behaviors:   N/A   Emotional Irregularity:   N/A   Other Mood/Personality Symptoms:   Pt high self harm risk    Mental Status Exam Appearance and self-care  Stature:   Average   Weight:   Average weight   Clothing:   Casual   Grooming:   Normal   Cosmetic use:   None   Posture/gait:   Normal   Motor activity:   Not Remarkable   Sensorium  Attention:   Normal   Concentration:   Normal   Orientation:   X5   Recall/memory:   Normal   Affect and Mood  Affect:   Depressed; Full Range; Appropriate; Anxious   Mood:   Depressed; Anxious   Relating  Eye contact:   Normal   Facial expression:   Depressed; Responsive; Anxious   Attitude toward examiner:   Cooperative   Thought and Language  Speech flow:  Clear and Coherent; Normal   Thought content:   Appropriate to Mood and Circumstances  Preoccupation:   None   Hallucinations:   None   Organization:  No data recorded  [employer] of Knowledge:   Fair   Intelligence:   Average   Abstraction:   Normal   Judgement:   Normal   Reality TestingNaval architect; Adequate   Insight:   Poor   Decision Making:   Normal   Social Functioning  Social Maturity:   Responsible   Social Judgement:   Normal   Stress  Stressors:   Transitions   Coping Ability:   Normal   Skill Deficits:   None   Supports:   Family     Religion:    Leisure/Recreation:    Exercise/Diet: Exercise/Diet Do You Follow a Special Diet?: No Do You Have Any Trouble Sleeping?: Yes Explanation of Sleeping Difficulties: Patient reports poor sleep   CCA Employment/Education Employment/Work Situation: Employment / Work Situation Employment Situation: On disability Why  is Patient on Disability: The patient stated mental health.  Education:     CCA Family/Childhood History Family and Relationship History:    Childhood History:     Child/Adolescent Assessment:     CCA Substance Use Alcohol/Drug Use: Alcohol / Drug Use Pain Medications: See PTA Prescriptions: See PTA Over the Counter: See PTA History of alcohol / drug use?: No history of alcohol / drug abuse Longest period of sobriety (when/how long): n/a                         ASAM's:  Six Dimensions of Multidimensional Assessment  Dimension 1:  Acute Intoxication and/or Withdrawal Potential:      Dimension 2:  Biomedical Conditions and Complications:      Dimension 3:  Emotional, Behavioral, or Cognitive Conditions and Complications:     Dimension 4:  Readiness to Change:     Dimension 5:  Relapse, Continued use, or Continued Problem Potential:     Dimension 6:  Recovery/Living Environment:     ASAM Severity Score:    ASAM Recommended Level of Treatment:     Substance use Disorder (SUD)    Recommendations for Services/Supports/Treatments:    DSM5 Diagnoses: Patient Active Problem List   Diagnosis Date Noted   PTSD (post-traumatic stress disorder) 09/16/2022   Suicidal ideation 09/15/2022   Malingering 06/28/2021   Cocaine use    Cluster B personality disorder (HCC) 05/03/2021   Essential hypertension 01/18/2021   Alcohol abuse 01/18/2021   Bipolar I disorder, most recent episode depressed [facility])     Patient Centered Plan: Patient is on the following Treatment Plan(s):  Anxiety and Depression   Referrals to Alternative Service(s): Referred to Alternative Service(s):   Place:   Date:   Time:    Referred to Alternative Service(s):   Place:   Date:   Time:    Referred to Alternative Service(s):   Place:   Date:   Time:    Referred to Alternative Service(s):   Place:   Date:   Time:      @BHCOLLABOFCARE @  Karie Skowron R Theatre manager, Counselor, LCAS-A

## 2022-12-18 NOTE — ED Notes (Signed)
Attempted to call report to inpatient unit.  Asked to call back in 20 minutes after there change of shift report.

## 2022-12-18 NOTE — ED Triage Notes (Signed)
Pt states he has been out of his medication for about a week and a half and is now having suicidal thoughts. Pt states his plan would be to hang himself.

## 2022-12-18 NOTE — Consult Note (Signed)
[facility] Face-to-Face Psychiatry Consult   Reason for Consult:  suicidal ideations with plan to hang himself Referring Physician:  EDP Patient Identification: [name] MRN:  [id number] Principal Diagnosis: Bipolar I disorder, most recent episode depressed (HCC) Diagnosis:  Principal Problem:   Bipolar I disorder, most recent episode depressed (HCC)   Total Time spent with patient: 45 minutes  Subjective:   [name] is a 62 y.o. male patient admitted with suicidal ideations and plan to hang himself.  HPI:  62 yo male presented to the ED with suicidal ideations and a plan to hang himself, history of bipolar d/o.  High depression with suicidal ideations starting a week ago triggered by "running out of my medications".  He cannot provide a rationale to this as he is a client at [facility] and should not have issues obtaining his medications.  High anxiety with frequent panic attacks.  Sleep initiation and maintenance are poor, sleeping about 4 hours a night.  No issues with his appetite.  Denies substance abuse, paranoia, homicidal ideations, and hallucinations.  He is well known to this facility for similar presentations and admissions, admit to inpatient for stabilizations.  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 echo 5/17: EF 60-65%, no RWMA, LV dias fxn nl, LA mildly dilated, RV sys fxn nl, PASP nl, moderate sized circumferential  pericardial effusion was identified, 2.12 cm around the LV free wall, <1 cm around the RV free wall. Features were not c/w tamponade physiology   PTSD (post-traumatic stress disorder)    Witnessed brother's suicide.   Restless leg syndrome    Seizures (HCC)    Syncope     Past Surgical History:  Procedure Laterality Date   CYSTOSCOPY WITH URETEROSCOPY AND STENT PLACEMENT     ESOPHAGOGASTRODUODENOSCOPY N/A 01/11/2016   Procedure: ESOPHAGOGASTRODUODENOSCOPY (EGD);  Surgeon: Charlott Rakes, MD;  Location: [facility] ENDOSCOPY;  Service: Endoscopy;  Laterality: N/A;   ESOPHAGOGASTRODUODENOSCOPY N/A 04/09/2020   Procedure: ESOPHAGOGASTRODUODENOSCOPY (EGD);  Surgeon: [name], MD;  Location: [facility] ENDOSCOPY;  Service: Gastroenterology;  Laterality: N/A;   INCISION AND DRAINAGE ABSCESS Left 01/02/2018   Procedure: INCISION AND DRAINAGE LEFT KNEE;  Surgeon: Deeann Saint, MD;  Location: [facility] ORS;  Service: Orthopedics;  Laterality: Left;   JOINT REPLACEMENT Right    TKR   KNEE ARTHROSCOPY Right 06/25/2014   Procedure: ARTHROSCOPY KNEE;  Surgeon: Deeann Saint, MD;  Location: [facility] ORS;  Service: Orthopedics;  Laterality: Right;  partial arthroscopic medial menisectomy   LAPAROSCOPIC APPENDECTOMY N/A 06/02/2021   Procedure: APPENDECTOMY LAPAROSCOPIC;  Surgeon: Campbell Lerner, MD;  Location: [facility] ORS;  Service: General;  Laterality: N/A;   TOTAL KNEE ARTHROPLASTY Right 04/22/2015   Procedure: TOTAL KNEE ARTHROPLASTY;  Surgeon: Deeann Saint, MD;  Location: [facility] ORS;  Service: Orthopedics;  Laterality: Right;   TOTAL KNEE ARTHROPLASTY Left 10/30/2017  Procedure: TOTAL KNEE ARTHROPLASTY;  Surgeon: Deeann Saint, MD;  Location: [facility] ORS;  Service: Orthopedics;  Laterality: Left;   TOTAL KNEE REVISION Left 01/02/2018   Procedure: poly exchange of tibia and patella left knee;  Surgeon: Deeann Saint, MD;  Location: [facility] ORS;  Service: Orthopedics;  Laterality: Left;   UMBILICAL HERNIA REPAIR  06/02/2021    Procedure: HERNIA REPAIR UMBILICAL ADULT;  Surgeon: Campbell Lerner, MD;  Location: [facility] ORS;  Service: General;;   Family History:  Family History  Problem Relation Age of Onset   CVA Mother        deceased at age 80   Depression Brother        Died by suicide at age 72   Family Psychiatric  History: see above Social History:  Social History   Substance and Sexual Activity  Alcohol Use Not Currently   Comment: rare     Social History   Substance and Sexual Activity  Drug Use Yes   Types: Marijuana, Cocaine   Comment: has not used recently    Social History   Socioeconomic History   Marital status: Single    Spouse name: Not on file   Number of children: Not on file   Years of education: Not on file   Highest education level: Not on file  Occupational History   Not on file  Tobacco Use   Smoking status: Former    Current packs/day: 0.00    Average packs/day: 0.8 packs/day for 20.0 years (15.0 ttl pk-yrs)    Types: Cigarettes    Start date: 05/16/1964    Quit date: 05/16/1984    Years since quitting: 38.6   Smokeless tobacco: Never  Vaping Use   Vaping status: Never Used  Substance and Sexual Activity   Alcohol use: Not Currently    Comment: rare   Drug use: Yes    Types: Marijuana, Cocaine    Comment: has not used recently   Sexual activity: Not Currently  Other Topics Concern   Not on file  Social History Narrative   ** Merged History Encounter **       Social Determinants of Health   Financial Resource Strain: Not on file  Food Insecurity: Food Insecurity Present (09/15/2022)   Hunger Vital Sign    Worried About Running Out of Food in the Last Year: Sometimes true    Ran Out of Food in the Last Year: Sometimes true  Transportation Needs: Unmet Transportation Needs (09/15/2022)   PRAPARE - Administrator, Civil Service (Medical): Yes    Lack of Transportation (Non-Medical): Yes  Physical Activity: Not on file  Stress: Not on file  Social  Connections: Not on file   Additional Social History:    Allergies:  No Known Allergies  Labs:  Results for orders placed or performed during the hospital encounter of 12/18/22 (from the past 48 hour(s))  Comprehensive metabolic panel     Status: Abnormal   Collection Time: 12/18/22  2:34 PM  Result Value Ref Range   Sodium 137 135 - 145 mmol/L   Potassium 3.9 3.5 - 5.1 mmol/L   Chloride 103 98 - 111 mmol/L   CO2 19 (L) 22 - 32 mmol/L   Glucose, Bld 112 (H) 70 - 99 mg/dL    Comment: Glucose reference range applies only to samples taken after fasting for at least 8 hours.   BUN 16 6 - 20 mg/dL   Creatinine, Ser 2.95 0.61 - 1.24 mg/dL  Calcium 9.9 8.9 - 10.3 mg/dL   Total Protein 8.4 (H) 6.5 - 8.1 g/dL   Albumin 4.7 3.5 - 5.0 g/dL   AST 22 15 - 41 U/L   ALT 20 0 - 44 U/L   Alkaline Phosphatase 79 38 - 126 U/L   Total Bilirubin 1.2 (H) <1.2 mg/dL   GFR, Estimated >96 >29 mL/min    Comment: (NOTE) Calculated using the CKD-EPI Creatinine Equation (2021)    Anion gap 15 5 - 15    Comment: Performed at [facility], [address] [place], [place] [address]  Ethanol     Status: None   Collection Time: 12/18/22  2:34 PM  Result Value Ref Range   Alcohol, Ethyl (B) <10 <10 mg/dL    Comment: (NOTE) Lowest detectable limit for serum alcohol is 10 mg/dL.  For medical purposes only. Performed at [facility], [address] [place], [place] [address]   cbc     Status: None   Collection Time: 12/18/22  2:34 PM  Result Value Ref Range   WBC 8.2 4.0 - 10.5 K/uL   RBC 5.47 4.22 - 5.81 MIL/uL   Hemoglobin 16.4 13.0 - 17.0 g/dL   HCT 10.2 72.5 - 36.6 %   MCV 87.9 80.0 - 100.0 fL   MCH 30.0 26.0 - 34.0 pg   MCHC 34.1 30.0 - 36.0 g/dL   RDW 44.0 34.7 - 42.5 %   Platelets 270 150 - 400 K/uL   nRBC 0.0 0.0 - 0.2 %    Comment: Performed at [facility], [address], [place], [place] [address]  Urine Drug Screen, Qualitative     Status: None    Collection Time: 12/18/22  2:34 PM  Result Value Ref Range   Tricyclic, Ur Screen NONE DETECTED NONE DETECTED   Amphetamines, Ur Screen NONE DETECTED NONE DETECTED   MDMA (Ecstasy)Ur Screen NONE DETECTED NONE DETECTED   Cocaine Metabolite,Ur [place] NONE DETECTED NONE DETECTED   Opiate, Ur Screen NONE DETECTED NONE DETECTED   Phencyclidine (PCP) Ur S NONE DETECTED NONE DETECTED   Cannabinoid 50 Ng, Ur [place] NONE DETECTED NONE DETECTED   Barbiturates, Ur Screen NONE DETECTED NONE DETECTED   Benzodiazepine, Ur Scrn NONE DETECTED NONE DETECTED   Methadone Scn, Ur NONE DETECTED NONE DETECTED    Comment: (NOTE) Tricyclics + metabolites, urine    Cutoff 1000 ng/mL Amphetamines + metabolites, urine  Cutoff 1000 ng/mL MDMA (Ecstasy), urine              Cutoff 500 ng/mL Cocaine Metabolite, urine          Cutoff 300 ng/mL Opiate + metabolites, urine        Cutoff 300 ng/mL Phencyclidine (PCP), urine         Cutoff 25 ng/mL Cannabinoid, urine                 Cutoff 50 ng/mL Barbiturates + metabolites, urine  Cutoff 200 ng/mL Benzodiazepine, urine              Cutoff 200 ng/mL Methadone, urine                   Cutoff 300 ng/mL  The urine drug screen provides only a preliminary, unconfirmed analytical test result and should not be used for non-medical purposes. Clinical consideration and professional judgment should be applied to any positive drug screen result due to possible interfering substances. A more specific alternate chemical method must be  used in order to obtain a confirmed analytical result. Gas chromatography / mass spectrometry (GC/MS) is the preferred confirm atory method. Performed at [facility], [address] [place], [place] [address]     Current Facility-Administered Medications  Medication Dose Route Frequency Provider Last Rate Last Admin   alum & mag hydroxide-simeth (MAALOX/MYLANTA) 200-200-20 MG/5ML suspension 30 mL  30 mL Oral Q6H PRN Sharman Cheek, MD        Baclofen TABS 5 mg  5 mg Oral BID Charm Rings, NP       benztropine (COGENTIN) tablet 1 mg  1 mg Oral Q6H PRN Charm Rings, NP       buPROPion (WELLBUTRIN XL) 24 hr tablet 150 mg  150 mg Oral Daily Charm Rings, NP       busPIRone (BUSPAR) tablet 5 mg  5 mg Oral BID Charm Rings, NP       diphenhydrAMINE (BENADRYL) capsule 50 mg  50 mg Oral BID PRN Charm Rings, NP       Or   diphenhydrAMINE (BENADRYL) injection 50 mg  50 mg Intramuscular BID PRN Charm Rings, NP       divalproex (DEPAKOTE) DR tablet 1,000 mg  1,000 mg Oral Q12H Johniece Hornbaker, Herminio Heads, NP       haloperidol (HALDOL) tablet 5 mg  5 mg Oral TID PRN Charm Rings, NP       ibuprofen (ADVIL) tablet 600 mg  600 mg Oral Q8H PRN Sharman Cheek, MD       mirtazapine (REMERON) tablet 15 mg  15 mg Oral QHS Charm Rings, NP       OLANZapine (ZYPREXA) tablet 5 mg  5 mg Oral BID PRN Charm Rings, NP       Or   OLANZapine (ZYPREXA) injection 5 mg  5 mg Intramuscular BID PRN Charm Rings, NP       ondansetron [facility]) tablet 4 mg  4 mg Oral Q8H PRN Sharman Cheek, MD       pantoprazole (PROTONIX) EC tablet 20 mg  20 mg Oral BID AC Kimon Loewen, Herminio Heads, NP       QUEtiapine (SEROQUEL) tablet 400 mg  400 mg Oral QHS Charm Rings, NP       rOPINIRole (REQUIP) tablet 1 mg  1 mg Oral QHS Charm Rings, NP       Current Outpatient Medications  Medication Sig Dispense Refill   baclofen 5 MG TABS Take 1 tablet (5 mg total) by mouth 2 (two) times daily. 30 each 0   benztropine (COGENTIN) 1 MG tablet Take 1 tablet (1 mg total) by mouth every 6 (six) hours as needed for tremors (agitation). 90 tablet 0   buPROPion (WELLBUTRIN XL) 150 MG 24 hr tablet Take 1 tablet (150 mg total) by mouth daily. 30 tablet 3   busPIRone (BUSPAR) 5 MG tablet Take 1 tablet (5 mg total) by mouth 2 (two) times daily. 60 tablet 3   butalbital-acetaminophen-caffeine (FIORICET) 50-325-40 MG tablet Take 1 tablet by mouth every 6 (six) hours as  needed for headache or migraine.     divalproex (DEPAKOTE) 500 MG DR tablet Take 2 tablets (1,000 mg total) by mouth every 12 (twelve) hours. 60 tablet 3   haloperidol (HALDOL) 5 MG tablet Take 1 tablet (5 mg total) by mouth 3 (three) times daily as needed for agitation. 90 tablet 0   mirtazapine (REMERON) 15 MG tablet Take 1 tablet (15  mg total) by mouth at bedtime. 30 tablet 3   pantoprazole (PROTONIX) 20 MG tablet Take 1 tablet (20 mg total) by mouth 2 (two) times daily before a meal. 60 tablet 3   QUEtiapine (SEROQUEL) 400 MG tablet Take 1 tablet (400 mg total) by mouth at bedtime. 30 tablet 3   rOPINIRole (REQUIP) 1 MG tablet Take 1 tablet (1 mg total) by mouth at bedtime. 30 tablet 3    Musculoskeletal: Strength & Muscle Tone: within normal limits Gait & Station: normal Patient leans: N/A  Psychiatric Specialty Exam: Physical Exam Vitals and nursing note reviewed.  Constitutional:      Appearance: Normal appearance.  HENT:     Head: Normocephalic.     Nose: Nose normal.  Pulmonary:     Effort: Pulmonary effort is normal.  Musculoskeletal:        General: Normal range of motion.     Cervical back: Normal range of motion.  Neurological:     General: No focal deficit present.     Mental Status: He is alert and oriented to person, place, and time.     Review of Systems  Psychiatric/Behavioral:  Positive for depression and suicidal ideas. The patient is nervous/anxious.   All other systems reviewed and are negative.   Height 5\' 9"  (1.753 m), weight 81.6 kg.Body mass index is 26.58 kg/m.  General Appearance: Casual  Eye Contact:  Fair  Speech:  Normal Rate  Volume:  Normal  Mood:  Anxious and Depressed  Affect:  Congruent  Thought Process:  Coherent  Orientation:  Full (Time, Place, and Person)  Thought Content:  Rumination  Suicidal Thoughts:  Yes.  with intent/plan  Homicidal Thoughts:  No  Memory:  Immediate;   Fair Recent;   Fair Remote;   Fair  Judgement:   Fair  Insight:  Fair  Psychomotor Activity:  Normal  Concentration:  Concentration: Fair and Attention Span: Fair  Recall:  [employer] of Knowledge:  Fair  Language:  Good  Akathisia:  No  Handed:  Right  AIMS (if indicated):     Assets:  Housing Leisure Time Physical Health Resilience  ADL's:  Intact  Cognition:  WNL  Sleep:        Physical Exam: Physical Exam Vitals and nursing note reviewed.  Constitutional:      Appearance: Normal appearance.  HENT:     Head: Normocephalic.     Nose: Nose normal.  Pulmonary:     Effort: Pulmonary effort is normal.  Musculoskeletal:        General: Normal range of motion.     Cervical back: Normal range of motion.  Neurological:     General: No focal deficit present.     Mental Status: He is alert and oriented to person, place, and time.    Review of Systems  Psychiatric/Behavioral:  Positive for depression and suicidal ideas. The patient is nervous/anxious.   All other systems reviewed and are negative.  Height 5\' 9"  (1.753 m), weight 81.6 kg. Body mass index is 26.58 kg/m.  Treatment Plan Summary: Daily contact with patient to assess and evaluate symptoms and progress in treatment, Medication management, and Plan : Bipolar affective disorder, depressed, severe without psychosis: Depakote 1000 mg BID Wellbutrin 150 mg daily  General anxiety disorder: Buspar 5 mg BID  Insomnia: Remeron 15 mg daily at bedtime Seroquel restarted at 200 mg at bedtime, missing medication for a week   Disposition: Recommend psychiatric Inpatient admission when medically cleared.  Nanine Means, NP 12/18/2022 4:40 PM

## 2022-12-18 NOTE — Group Note (Signed)
Date:  12/18/2022 Time:  9:04 PM  Group Topic/Focus:  Wrap-Up Group:   The focus of this group is to help patients review their daily goal of treatment and discuss progress on daily workbooks.    Participation Level:  Did Not Attend  Participation Quality:   none  Affect:   none  Cognitive:   none  Insight: None  Engagement in Group:   none  Modes of Intervention:   none  Additional Comments:  none   [name] 12/18/2022, 9:04 PM

## 2022-12-19 MED ORDER — ADULT MULTIVITAMIN W/MINERALS CH: 1.0000 | ORAL_TABLET | Freq: Every day | ORAL | Status: DC

## 2022-12-19 NOTE — Progress Notes (Signed)
Pt calm and pleasant during assessment. Pt endorses depression. Pt isolative to his room tonight and he stated he just wanted to sleep. Pt given education, support, and encouragement to be active in his treatment plan. Pt compliant with medication administration per MD orders. Pt being monitored Q 15 minutes for safety per unit protocol, remains safe on the unit

## 2022-12-19 NOTE — Tx Team (Signed)
Initial Treatment Plan 12/19/2022 2:58 AM [name] [id number]    PATIENT STRESSORS: Financial difficulties   Medication change or noncompliance     PATIENT STRENGTHS: Printmaker for treatment/growth    PATIENT IDENTIFIED PROBLEMS: "I stopped taking my medication I had transport problems"  "I felt like killing myself by hanging"  I am depressed and very anxious                 DISCHARGE CRITERIA:  Motivation to continue treatment in a less acute level of care Safe-care adequate arrangements made Verbal commitment to aftercare and medication compliance  PRELIMINARY DISCHARGE PLAN: Outpatient therapy Placement in alternative living arrangements  PATIENT/FAMILY INVOLVEMENT: This treatment plan has been presented to and reviewed with the patient, [name], and/or family member.  The patient and family have been given the opportunity to ask questions and make suggestions.  Margarita Rana, RN 12/19/2022, 2:58 AM

## 2022-12-19 NOTE — Plan of Care (Signed)
  Problem: Education: Goal: Emotional status will improve Outcome: Not Progressing Goal: Mental status will improve Outcome: Not Progressing Goal: Verbalization of understanding the information provided will improve Outcome: Not Progressing   

## 2022-12-19 NOTE — Plan of Care (Signed)
  Problem: Education: Goal: Knowledge of [place] [facility] Education information/materials will improve Outcome: Progressing Goal: Verbalization of understanding the information provided will improve Outcome: Progressing   Problem: Activity: Goal: Sleeping patterns will improve Outcome: Progressing   Problem: Coping: Goal: Ability to verbalize frustrations and anger appropriately will improve Outcome: Progressing

## 2022-12-19 NOTE — Progress Notes (Signed)
ADMISSION DAR NOTE:   Patient presents from the ED alert and oriented by 3. Mood and affect is sad and tearful. Patient stated he was feeling like taking his life by hanging due to stopping his medications due to transport problems. Patient lives with a roommate has no family support. "My girlfriend of 33 years has dementia she is in a Nursing Home and I walk from [place] to [place] on foot to go see her" " We had a house together but the State took the house from me and left me homeless"   Patient endorses AH sometimes and denies taking alcohol or drugs. Denies any abuse history. Has family history of mental health. Verbally contracted for safety.   Emotional support and availability offered to Patient as needed. Skin assessment done and belongings searched per protocol. Items deemed contraband secured in locker. Unit orientation and routine discussed, Care Plan reviewed as well and Patient verbalized understanding. Fluids and Food offered, tolerated well. Q15 minutes safety checks initiated without self harm gestures.

## 2022-12-19 NOTE — Group Note (Signed)
Date:  12/19/2022 Time:  9:48 PM  Group Topic/Focus:  Orientation:   The focus of this group is to educate the patient on the purpose and policies of crisis stabilization and provide a format to answer questions about their admission.  The group details unit policies and expectations of patients while admitted.    Participation Level:  Did Not Attend  Participation Quality:   none  Affect:   none  Cognitive:   none  Insight: None  Engagement in Group:   none  Modes of Intervention:   none  Additional Comments:  none   Pierce Barocio 12/19/2022, 9:48 PM

## 2022-12-19 NOTE — [facility] Suicide Risk Assessment (Signed)
[facility] INPATIENT:  Family/Significant Other Suicide Prevention Education  Suicide Prevention Education:  Patient Refusal for Family/Significant Other Suicide Prevention Education: The patient [name] has refused to provide written consent for family/significant other to be provided Family/Significant Other Suicide Prevention Education during admission and/or prior to discharge.  Physician notified.  [name] 12/19/2022, 11:26 AM

## 2022-12-19 NOTE — BHH Counselor (Signed)
Adult Comprehensive Assessment  Patient ID: [name], male   DOB: 1960/08/28, 62 y.o.   MRN: [id number]  Information Source: Information source: Patient  Current Stressors:  Patient states their primary concerns and needs for treatment are:: "Suicidal thoughts." Patient states their goals for this hospitilization and ongoing recovery are:: "Stop hearing the voices." Educational / Learning stressors: Patient denies. Employment / Job issues: "I get disability." Family Relationships: Patient denies. Financial / Lack of resources (include bankruptcy): Patient denies. Housing / Lack of housing: Patient denies. Physical health (include injuries & life threatening diseases): Patient denies. Social relationships: Patient denies. Substance abuse: Patient denies. Bereavement / Loss: Patient denies.  Living/Environment/Situation:  Living Arrangements: Non-relatives/Friends Living conditions (as described by patient or guardian): WNL Who else lives in the home?: "I live with a friend." How long has patient lived in current situation?: "7 months." What is atmosphere in current home: Comfortable  Family History:  Marital status: Long term relationship Long term relationship, how long?: "33 years." What types of issues is patient dealing with in the relationship?: "She lives in a nusring home." Are you sexually active?: No What is your sexual orientation?: "I like women." Has your sexual activity been affected by drugs, alcohol, medication, or emotional stress?: Patient denies. Does patient have children?: No  Childhood History:  By whom was/is the patient raised?: Mother, Father Description of patient's relationship with caregiver when they were a child: "A lot of alcohol use." Patient's description of current relationship with people who raised him/her: "They are both deceased." How were you disciplined when you got in trouble as a child/adolescent?: "Belt.: Does patient have  siblings?: Yes Number of Siblings: 1 Description of patient's current relationship with siblings: Patient did not share details. Did patient suffer any verbal/emotional/physical/sexual abuse as a child?: Yes Did patient suffer from severe childhood neglect?: No Has patient ever been sexually abused/assaulted/raped as an adolescent or adult?: No Was the patient ever a victim of a crime or a disaster?: No Witnessed domestic violence?: Yes Has patient been affected by domestic violence as an adult?: No Description of domestic violence: Patient did not share details.  Education:  Highest grade of school patient has completed: "The 8th grade." Currently a student?: No Learning disability?: No  Employment/Work Situation:   Employment Situation: On disability Why is Patient on Disability: "I was diagnosed with Bi-polar in 2012." How Long has Patient Been on Disability: "Since 2012." Patient's Job has Been Impacted by Current Illness: No What is the Longest Time Patient has Held a Job?: The patient reports 4 years. 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?: No  Financial Resources:   Financial resources: [employer], [employer], Medicaid Does patient have a Lawyer or guardian?: No  Alcohol/Substance Abuse:   What has been your use of drugs/alcohol within the last 12 months?: Patient denies. If attempted suicide, did drugs/alcohol play a role in this?: No Alcohol/Substance Abuse Treatment Hx: Denies past history Has alcohol/substance abuse ever caused legal problems?: No  Social Support System:   Forensic psychologist System: None Describe Community Support System: "Not really." Type of faith/religion: "Christian." How does patient's faith help to cope with current illness?: "I pray."  Leisure/Recreation:   Do You Have Hobbies?: No  Strengths/Needs:   What is the patient's perception of their strengths?:  "Not right now." Patient states they can use these personal strengths during their treatment to contribute to their recovery: Patient denies.  Patient states these barriers may affect/interfere with their treatment: 'None.": Patient states these barriers may affect their return to the community: "None."  Discharge Plan:   Currently receiving community mental health services: No Patient states concerns and preferences for aftercare planning are: Patient denies. Patient states they will know when they are safe and ready for discharge when: "When I stop hearing the voices." Does patient have access to transportation?: Yes Does patient have financial barriers related to discharge medications?: Yes Patient description of barriers related to discharge medications: Patient had anxiety previously of getting medications due to insurance. Will patient be returning to same living situation after discharge?: Yes  Summary/Recommendations:   Summary and Recommendations (to be completed by the evaluator): Patient is a 62 year old white male who presented to the ED involuntarily for suicidal ideations with a plan. Patient has a history of bipolar disorder and anxiety with panic attacks. According to triage notes, the patient's SI started a week ago and were triggered by him "running out of medications." During assessment with Clinical social worker, the patient denied any current stressors outside of "hearing voices." Patient added that he currently has a long term girlfriend of 33 years who is in a nursing home. Patient currently resides with a roommate and described his living environment as "comfortable" adding that while unemployed he has no financial stressors. Patient currently receives disability due to his Bi-polar diagnosis as well as EBT. Patient added that he doesn't currently have a strong support system. Patient is currently not receiving any therapeutic or psychiatric treatment but has a history with  [facility]. Patient would like to continue with [facility]. During assessment patient denies suicidal/self-harm/homicidal ideation. There is no evidence of psychosis, and paranoia. Patient appeared tired with a flat effect but was forthcoming with information and cooperative.  Recommendations include: crisis stabilization, therapeutic milieu, encourage group attendance and participation, medication management for detox/mood stabilization and development of comprehensive mental wellness/sobriety plan.  [name]. 12/19/2022

## 2022-12-19 NOTE — Group Note (Signed)
[facility] LCSW Group Therapy Note    Group Date: 12/19/2022 Start Time: 1300 End Time: 1400  Type of Therapy and Topic:  Group Therapy:  Overcoming Obstacles  Participation Level:  [facility] PARTICIPATION LEVEL: Did Not Attend   Description of Group:   In this group patients will be encouraged to explore what they see as obstacles to their own wellness and recovery. They will be guided to discuss their thoughts, feelings, and behaviors related to these obstacles. The group will process together ways to cope with barriers, with attention given to specific choices patients can make. Each patient will be challenged to identify changes they are motivated to make in order to overcome their obstacles. This group will be process-oriented, with patients participating in exploration of their own experiences as well as giving and receiving support and challenge from other group members.  Therapeutic Goals: 1. Patient will identify personal and current obstacles as they relate to admission. 2. Patient will identify barriers that currently interfere with their wellness or overcoming obstacles.  3. Patient will identify feelings, thought process and behaviors related to these barriers. 4. Patient will identify two changes they are willing to make to overcome these obstacles:    Summary of Patient Progress X   Therapeutic Modalities:   Cognitive Behavioral Therapy Solution Focused Therapy Motivational Interviewing Relapse Prevention Therapy   Glenis Smoker, LCSW

## 2022-12-19 NOTE — Progress Notes (Signed)
Assumed care of patient this am, he presented  A&O x 3, affect & mood.is sad and depressed Pt seclusive to his room most of the day, c/o "not feeling well and reported having shoulder pain, 10/10. Pt declined to eat all his meals, encouraged to look at dinner tray, took one bite of his chicken breast and refused to eat more. Pt accepted his ensure X 2 and water encouraged, Pt denied thoughts, plan or intent to harm self or others and made a safety commitment,  Pt is compliant with taking medications  and denied having any side effects. Pt educated on plan of care, medication regimen and he acknowledged an understanding and was without further complaints or concerns. Pt maintained on q 15 min rounds for safety and support.

## 2022-12-19 NOTE — Group Note (Signed)
Date:  12/19/2022 Time:  5:06 PM  Group Topic/Focus:  Activity Group: The focus of the group is to encourage patients to go outside in the courtyard and get some fresh ar and some exercise.    Participation Level:  Did Not Attend   [name] 12/19/2022, 5:06 PM

## 2022-12-19 NOTE — Group Note (Signed)
Date:  12/19/2022 Time:  10:24 AM  Group Topic/Focus:  Goals Group:   The focus of this group is to help patients establish daily goals to achieve during treatment and discuss how the patient can incorporate goal setting into their daily lives to aide in recovery.    Participation Level:  Did Not Attend   [name] 12/19/2022, 10:24 AM

## 2022-12-19 NOTE — BH IP Treatment Plan (Signed)
Interdisciplinary Treatment and Diagnostic Plan Update  12/19/2022 Time of Session: 9:25 Am [name] MRN: [id number]  Principal Diagnosis: <principal problem not specified>  Secondary Diagnoses: Active Problems:   Major depressive disorder, recurrent severe without psychotic features (HCC)   Current Medications:  Current Facility-Administered Medications  Medication Dose Route Frequency Provider Last Rate Last Admin   acetaminophen (TYLENOL) tablet 650 mg  650 mg Oral Q6H PRN Charm Rings, NP   650 mg at 12/19/22 0950   alum & mag hydroxide-simeth (MAALOX/MYLANTA) 200-200-20 MG/5ML suspension 30 mL  30 mL Oral Q4H PRN Charm Rings, NP   30 mL at 12/18/22 2300   baclofen (LIORESAL) tablet 5 mg  5 mg Oral BID Sarina Ill, DO   5 mg at 12/19/22 0951   benztropine (COGENTIN) tablet 1 mg  1 mg Oral Q6H PRN Charm Rings, NP       buPROPion (WELLBUTRIN XL) 24 hr tablet 150 mg  150 mg Oral Daily Charm Rings, NP   150 mg at 12/19/22 0951   busPIRone (BUSPAR) tablet 5 mg  5 mg Oral BID Charm Rings, NP   5 mg at 12/19/22 5284   diphenhydrAMINE (BENADRYL) capsule 50 mg  50 mg Oral BID PRN Charm Rings, NP       Or   diphenhydrAMINE (BENADRYL) injection 50 mg  50 mg Intramuscular BID PRN Charm Rings, NP       divalproex (DEPAKOTE) DR tablet 1,000 mg  1,000 mg Oral Q12H Charm Rings, NP   1,000 mg at 12/19/22 1324   feeding supplement (ENSURE ENLIVE / ENSURE PLUS) liquid 237 mL  237 mL Oral BID BM Dixon, Rashaun M, NP       haloperidol (HALDOL) tablet 5 mg  5 mg Oral TID PRN Charm Rings, NP       ibuprofen (ADVIL) tablet 600 mg  600 mg Oral Q8H PRN Charm Rings, NP       magnesium hydroxide (MILK OF MAGNESIA) suspension 30 mL  30 mL Oral Daily PRN Charm Rings, NP       mirtazapine (REMERON) tablet 15 mg  15 mg Oral QHS Charm Rings, NP   15 mg at 12/18/22 2243   OLANZapine (ZYPREXA) tablet 5 mg  5 mg Oral BID PRN Charm Rings, NP        Or   OLANZapine (ZYPREXA) injection 5 mg  5 mg Intramuscular BID PRN Charm Rings, NP       ondansetron [facility]) tablet 4 mg  4 mg Oral Q8H PRN Charm Rings, NP       pantoprazole (PROTONIX) EC tablet 20 mg  20 mg Oral BID AC Charm Rings, NP   20 mg at 12/19/22 0951   QUEtiapine (SEROQUEL) tablet 200 mg  200 mg Oral QHS Charm Rings, NP   200 mg at 12/18/22 2243   rOPINIRole (REQUIP) tablet 1 mg  1 mg Oral QHS Charm Rings, NP   1 mg at 12/18/22 2300   PTA Medications: Medications Prior to Admission  Medication Sig Dispense Refill Last Dose   baclofen 5 MG TABS Take 1 tablet (5 mg total) by mouth 2 (two) times daily. 30 each 0    benztropine (COGENTIN) 1 MG tablet Take 1 tablet (1 mg total) by mouth every 6 (six) hours as needed for tremors (agitation). 90 tablet 0    buPROPion (WELLBUTRIN XL) 150 MG 24  hr tablet Take 1 tablet (150 mg total) by mouth daily. 30 tablet 3    busPIRone (BUSPAR) 5 MG tablet Take 1 tablet (5 mg total) by mouth 2 (two) times daily. 60 tablet 3    butalbital-acetaminophen-caffeine (FIORICET) 50-325-40 MG tablet Take 1 tablet by mouth every 6 (six) hours as needed for headache or migraine.      divalproex (DEPAKOTE) 500 MG DR tablet Take 2 tablets (1,000 mg total) by mouth every 12 (twelve) hours. 60 tablet 3    haloperidol (HALDOL) 5 MG tablet Take 1 tablet (5 mg total) by mouth 3 (three) times daily as needed for agitation. 90 tablet 0    mirtazapine (REMERON) 15 MG tablet Take 1 tablet (15 mg total) by mouth at bedtime. 30 tablet 3    pantoprazole (PROTONIX) 20 MG tablet Take 1 tablet (20 mg total) by mouth 2 (two) times daily before a meal. 60 tablet 3    QUEtiapine (SEROQUEL) 400 MG tablet Take 1 tablet (400 mg total) by mouth at bedtime. 30 tablet 3    rOPINIRole (REQUIP) 1 MG tablet Take 1 tablet (1 mg total) by mouth at bedtime. 30 tablet 3     Patient Stressors: Financial difficulties   Medication change or noncompliance    Patient  Strengths: Printmaker for treatment/growth   Treatment Modalities: Medication Management, Group therapy, Case management,  1 to 1 session with clinician, Psychoeducation, Recreational therapy.   Physician Treatment Plan for Primary Diagnosis: <principal problem not specified> Long Term Goal(s):     Short Term Goals:    Medication Management: Evaluate patient's response, side effects, and tolerance of medication regimen.  Therapeutic Interventions: 1 to 1 sessions, Unit Group sessions and Medication administration.  Evaluation of Outcomes: Not Met  Physician Treatment Plan for Secondary Diagnosis: Active Problems:   Major depressive disorder, recurrent severe without psychotic features (HCC)  Long Term Goal(s):     Short Term Goals:       Medication Management: Evaluate patient's response, side effects, and tolerance of medication regimen.  Therapeutic Interventions: 1 to 1 sessions, Unit Group sessions and Medication administration.  Evaluation of Outcomes: Not Met   RN Treatment Plan for Primary Diagnosis: <principal problem not specified> Long Term Goal(s): Knowledge of disease and therapeutic regimen to maintain health will improve  Short Term Goals: Ability to demonstrate self-control, Ability to participate in decision making will improve, Ability to verbalize feelings will improve, Ability to disclose and discuss suicidal ideas, Ability to identify and develop effective coping behaviors will improve, and Compliance with prescribed medications will improve  Medication Management: RN will administer medications as ordered by provider, will assess and evaluate patient's response and provide education to patient for prescribed medication. RN will report any adverse and/or side effects to prescribing provider.  Therapeutic Interventions: 1 on 1 counseling sessions, Psychoeducation, Medication administration, Evaluate responses to treatment, Monitor vital  signs and CBGs as ordered, Perform/monitor CIWA, COWS, AIMS and Fall Risk screenings as ordered, Perform wound care treatments as ordered.  Evaluation of Outcomes: Not Met   LCSW Treatment Plan for Primary Diagnosis: <principal problem not specified> Long Term Goal(s): Safe transition to appropriate next level of care at discharge, Engage patient in therapeutic group addressing interpersonal concerns.  Short Term Goals: Engage patient in aftercare planning with referrals and resources, Increase social support, Increase ability to appropriately verbalize feelings, Increase emotional regulation, Facilitate acceptance of mental health diagnosis and concerns, and Increase skills for wellness and recovery  Therapeutic  Interventions: Assess for all discharge needs, 1 to 1 time with Child psychotherapist, Explore available resources and support systems, Assess for adequacy in community support network, Educate family and significant other(s) on suicide prevention, Complete Psychosocial Assessment, Interpersonal group therapy.  Evaluation of Outcomes: Not Met   Progress in Treatment: Attending groups: Yes. Participating in groups: Yes. Taking medication as prescribed: Yes. Toleration medication: Yes. Family/Significant other contact made: No, will contact:  once permission has been given Patient understands diagnosis: Yes. Discussing patient identified problems/goals with staff: Yes. Medical problems stabilized or resolved: Yes. Denies suicidal/homicidal ideation: Yes. Issues/concerns per patient self-inventory: No. Other: none  New problem(s) identified: No, Describe:  none  New Short Term/Long Term Goal(s): detox, elimination of symptoms of psychosis, medication management for mood stabilization; elimination of SI thoughts; development of comprehensive mental wellness/sobriety plan.   Patient Goals:  "I want to stop hearing voices"  Discharge Plan or Barriers: CSW to assist in the development of  appropriate discharge plans. Patient reports, at this time, that he would like to return to his home and continue services with [facility].  Reason for Continuation of Hospitalization: Anxiety Depression Medical Issues Medication stabilization Suicidal ideation  Estimated Length of Stay:  1-7 days  Last 3 [place] Suicide Severity Risk Score: Flowsheet Row Admission (Current) from 12/18/2022 in [facility] INPATIENT BEHAVIORAL MEDICINE Most recent reading at 12/18/2022  8:00 PM ED from 12/18/2022 in [facility] Emergency Department at [facility] Most recent reading at 12/18/2022  2:35 PM Admission (Discharged) from 09/15/2022 in [facility] BEHAVIORAL MEDICINE Most recent reading at 09/15/2022 10:00 PM  C-SSRS RISK CATEGORY High Risk High Risk Moderate Risk       Last PHQ 2/9 Scores:     No data to display          Scribe for Treatment Team: Harden Mo, LCSW 12/19/2022 10:24 AM

## 2022-12-19 NOTE — Group Note (Signed)
Recreation Therapy Group Note   Group Topic:Problem Solving  Group Date: 12/19/2022 Start Time: 1000 End Time: 1100 Facilitators: Rosina Lowenstein, LRT, CTRS Location:  Craft Room  Group Description: Life Boat. Patients were given the scenario that they are on a boat that is about to become shipwrecked, leaving them stranded on an [place]. They are asked to make a list of 15 different items that they want to take with them when they are stranded on the [place]. Patients are asked to rank their items from most important to least important, #1 being the most important and #15 being the least. Patients will work individually for the first round to come up with 15 items and then pair up with a peer(s) to condense their list and come up with one list of 15 items between the two of them. Patients or LRT will read aloud the 15 different items to the group after each round. LRT facilitated post-activity processing to discuss how this activity can be used in daily life post discharge.   Goal Area(s) Addressed:  Patient will identify priorities, wants and needs. Patient will communicate with LRT and peers. Patient will work collectively as a Administrator, Civil Service. Patient will work on Product manager.    Affect/Mood: N/A   Participation Level: Did not attend    Clinical Observations/Individualized Feedback: [name] did not attend group.   Plan: Continue to engage patient in RT group sessions 2-3x/week.   Rosina Lowenstein, LRT, CTRS 12/19/2022 1:24 PM

## 2022-12-19 NOTE — Progress Notes (Signed)
Initial Nutrition Assessment  DOCUMENTATION CODES:   Not applicable  INTERVENTION:   Ensure Enlive po BID, each supplement provides 350 kcal and 20 grams of protein.  MVI po daily   NUTRITION DIAGNOSIS:   Predicted suboptimal nutrient intake related to social / environmental circumstances as evidenced by other (comment) (per chart review).  GOAL:   Patient will meet greater than or equal to 90% of their needs  MONITOR:   Supplement acceptance, PO intake  REASON FOR ASSESSMENT:   Malnutrition Screening Tool    ASSESSMENT:   62 y/o male with h/o MDD, bipolar I, HTN, substance abuse, PTSD, RLS, kidney stones, anxiety, HTN and hernia repair (2023) who is admitted with SI.  RD suspects pt with decreased oral intake at baseline r/t depression, homelessness and substance abuse. Pt is ordered for Ensure supplements. RD will add MVI to help pt meet his estimated needs. Per chart, pt appears weight stable at baseline.   Medications reviewed and include: remeron, protonix  Labs reviewed:   Diet Order:   Diet Order             Diet regular Room service appropriate? Yes; Fluid consistency: Thin  Diet effective now                  EDUCATION NEEDS:   No education needs have been identified at this time  Skin:  Skin Assessment: Reviewed RN Assessment  Last BM:  12/9  Height:   Ht Readings from Last 1 Encounters:  12/18/22 5\' 9"  (1.753 m)    Weight:   Wt Readings from Last 1 Encounters:  12/18/22 77.1 kg   BMI:  Body mass index is 25.1 kg/m.  Estimated Nutritional Needs:   Kcal:  1900-2200kcal/day  Protein:  95-110g/day  Fluid:  1.9-2.2L/day  Betsey Holiday MS, RD, LDN If unable to be reached, please send secure chat to "RD inpatient" available from 8:00a-4:00p daily

## 2022-12-20 ENCOUNTER — Inpatient Hospital Stay: Payer: MEDICAID

## 2022-12-20 ENCOUNTER — Encounter: Payer: Self-pay | Admitting: Family Medicine

## 2022-12-20 ENCOUNTER — Encounter (HOSPITAL_COMMUNITY): Payer: Self-pay

## 2022-12-20 ENCOUNTER — Ambulatory Visit
Admission: RE | Admit: 2022-12-20 | Discharge: 2022-12-20 | Disposition: A | Discharge status: Home / Self Care | Payer: MEDICAID | Source: Ambulatory Visit | Attending: Family Medicine | Admitting: Family Medicine

## 2022-12-20 ENCOUNTER — Other Ambulatory Visit: Payer: Self-pay

## 2022-12-20 ENCOUNTER — Inpatient Hospital Stay
Admission: AD | Admit: 2022-12-20 | Discharge: 2022-12-23 | DRG: 281 | Disposition: A | Discharge status: Other Acute Inpatient Hospital | Payer: MEDICAID | Source: Ambulatory Visit | Attending: Student | Admitting: Student

## 2022-12-20 ENCOUNTER — Ambulatory Visit: Admission: RE | Admit: 2022-12-20 | Payer: MEDICAID | Source: Ambulatory Visit

## 2022-12-20 DIAGNOSIS — F1490 Cocaine use, unspecified, uncomplicated: Secondary | ICD-10-CM | POA: Diagnosis present

## 2022-12-20 DIAGNOSIS — I4892 Unspecified atrial flutter: Secondary | ICD-10-CM

## 2022-12-20 DIAGNOSIS — E876 Hypokalemia: Secondary | ICD-10-CM | POA: Diagnosis present

## 2022-12-20 DIAGNOSIS — F3130 Bipolar disorder, current episode depressed, mild or moderate severity, unspecified: Secondary | ICD-10-CM | POA: Diagnosis present

## 2022-12-20 DIAGNOSIS — I3139 Other pericardial effusion (noninflammatory): Secondary | ICD-10-CM | POA: Diagnosis present

## 2022-12-20 DIAGNOSIS — Z96653 Presence of artificial knee joint, bilateral: Secondary | ICD-10-CM | POA: Diagnosis present

## 2022-12-20 DIAGNOSIS — F1010 Alcohol abuse, uncomplicated: Secondary | ICD-10-CM | POA: Diagnosis present

## 2022-12-20 DIAGNOSIS — R569 Unspecified convulsions: Secondary | ICD-10-CM | POA: Diagnosis present

## 2022-12-20 DIAGNOSIS — F332 Major depressive disorder, recurrent severe without psychotic features: Principal | ICD-10-CM | POA: Diagnosis present

## 2022-12-20 DIAGNOSIS — F329 Major depressive disorder, single episode, unspecified: Secondary | ICD-10-CM | POA: Diagnosis not present

## 2022-12-20 DIAGNOSIS — F1410 Cocaine abuse, uncomplicated: Secondary | ICD-10-CM | POA: Diagnosis present

## 2022-12-20 DIAGNOSIS — R45851 Suicidal ideations: Secondary | ICD-10-CM | POA: Diagnosis present

## 2022-12-20 DIAGNOSIS — F411 Generalized anxiety disorder: Secondary | ICD-10-CM | POA: Diagnosis present

## 2022-12-20 DIAGNOSIS — F6089 Other specific personality disorders: Secondary | ICD-10-CM | POA: Diagnosis present

## 2022-12-20 DIAGNOSIS — Z87442 Personal history of urinary calculi: Secondary | ICD-10-CM

## 2022-12-20 DIAGNOSIS — I21A1 Myocardial infarction type 2: Secondary | ICD-10-CM | POA: Diagnosis present

## 2022-12-20 DIAGNOSIS — F410 Panic disorder [episodic paroxysmal anxiety] without agoraphobia: Secondary | ICD-10-CM | POA: Diagnosis present

## 2022-12-20 DIAGNOSIS — I25118 Atherosclerotic heart disease of native coronary artery with other forms of angina pectoris: Secondary | ICD-10-CM | POA: Diagnosis present

## 2022-12-20 DIAGNOSIS — R7989 Other specified abnormal findings of blood chemistry: Secondary | ICD-10-CM | POA: Diagnosis not present

## 2022-12-20 DIAGNOSIS — F29 Unspecified psychosis not due to a substance or known physiological condition: Secondary | ICD-10-CM | POA: Diagnosis present

## 2022-12-20 DIAGNOSIS — Z818 Family history of other mental and behavioral disorders: Secondary | ICD-10-CM

## 2022-12-20 DIAGNOSIS — I483 Typical atrial flutter: Secondary | ICD-10-CM | POA: Diagnosis present

## 2022-12-20 DIAGNOSIS — I4891 Unspecified atrial fibrillation: Secondary | ICD-10-CM | POA: Diagnosis present

## 2022-12-20 DIAGNOSIS — E872 Acidosis, unspecified: Secondary | ICD-10-CM | POA: Diagnosis present

## 2022-12-20 DIAGNOSIS — I110 Hypertensive heart disease with heart failure: Secondary | ICD-10-CM | POA: Diagnosis present

## 2022-12-20 DIAGNOSIS — B1920 Unspecified viral hepatitis C without hepatic coma: Secondary | ICD-10-CM | POA: Diagnosis present

## 2022-12-20 DIAGNOSIS — I4430 Unspecified atrioventricular block: Secondary | ICD-10-CM | POA: Diagnosis present

## 2022-12-20 DIAGNOSIS — F4310 Post-traumatic stress disorder, unspecified: Secondary | ICD-10-CM | POA: Diagnosis present

## 2022-12-20 DIAGNOSIS — G2581 Restless legs syndrome: Secondary | ICD-10-CM | POA: Diagnosis present

## 2022-12-20 DIAGNOSIS — R9431 Abnormal electrocardiogram [ECG] [EKG]: Secondary | ICD-10-CM | POA: Diagnosis not present

## 2022-12-20 DIAGNOSIS — Z87891 Personal history of nicotine dependence: Secondary | ICD-10-CM

## 2022-12-20 DIAGNOSIS — Z823 Family history of stroke: Secondary | ICD-10-CM

## 2022-12-20 DIAGNOSIS — I2489 Other forms of acute ischemic heart disease: Secondary | ICD-10-CM | POA: Diagnosis present

## 2022-12-20 DIAGNOSIS — Z5982 Transportation insecurity: Secondary | ICD-10-CM

## 2022-12-20 DIAGNOSIS — F1510 Other stimulant abuse, uncomplicated: Secondary | ICD-10-CM | POA: Diagnosis present

## 2022-12-20 DIAGNOSIS — I471 Supraventricular tachycardia, unspecified: Secondary | ICD-10-CM | POA: Diagnosis present

## 2022-12-20 DIAGNOSIS — K219 Gastro-esophageal reflux disease without esophagitis: Secondary | ICD-10-CM | POA: Diagnosis present

## 2022-12-20 DIAGNOSIS — I5032 Chronic diastolic (congestive) heart failure: Secondary | ICD-10-CM | POA: Diagnosis present

## 2022-12-20 DIAGNOSIS — Z79899 Other long term (current) drug therapy: Secondary | ICD-10-CM

## 2022-12-20 DIAGNOSIS — R339 Retention of urine, unspecified: Secondary | ICD-10-CM | POA: Diagnosis present

## 2022-12-20 DIAGNOSIS — Z5941 Food insecurity: Secondary | ICD-10-CM

## 2022-12-20 LAB — BLOOD GAS, VENOUS
Acid-base deficit: 0.9 mmol/L (ref 0.0–2.0)
Bicarbonate: 20.1 mmol/L (ref 20.0–28.0)
O2 Saturation: 100 %
Patient temperature: 37
pCO2, Ven: 24 mm[Hg] — ABNORMAL LOW (ref 44–60)
pH, Ven: 7.53 — ABNORMAL HIGH (ref 7.25–7.43)
pO2, Ven: 181 mm[Hg] — ABNORMAL HIGH (ref 32–45)

## 2022-12-20 LAB — CBC
HCT: 46.6 % (ref 39.0–52.0)
Hemoglobin: 16.3 g/dL (ref 13.0–17.0)
MCH: 30.4 pg (ref 26.0–34.0)
MCHC: 35 g/dL (ref 30.0–36.0)
MCV: 86.8 fL (ref 80.0–100.0)
Platelets: 234 10*3/uL (ref 150–400)
RBC: 5.37 MIL/uL (ref 4.22–5.81)
RDW: 13.3 % (ref 11.5–15.5)
WBC: 9.8 10*3/uL (ref 4.0–10.5)
nRBC: 0 % (ref 0.0–0.2)

## 2022-12-20 LAB — BASIC METABOLIC PANEL
Anion gap: 12 (ref 5–15)
BUN: 21 mg/dL — ABNORMAL HIGH (ref 6–20)
CO2: 18 mmol/L — ABNORMAL LOW (ref 22–32)
Calcium: 9.7 mg/dL (ref 8.9–10.3)
Chloride: 107 mmol/L (ref 98–111)
Creatinine, Ser: 1.27 mg/dL — ABNORMAL HIGH (ref 0.61–1.24)
GFR, Estimated: >60 mL/min (ref >60)
Glucose, Bld: 132 mg/dL — ABNORMAL HIGH (ref 70–99)
Potassium: 3.5 mmol/L (ref 3.5–5.1)
Sodium: 137 mmol/L (ref 135–145)

## 2022-12-20 LAB — PROTIME-INR
INR: 1.1 (ref 0.8–1.2)
Prothrombin Time: 14.1 s (ref 11.4–15.2)

## 2022-12-20 LAB — GLUCOSE, CAPILLARY: Glucose-Capillary: 151 mg/dL — ABNORMAL HIGH (ref 70–99)

## 2022-12-20 LAB — APTT: aPTT: 27 s (ref 24–36)

## 2022-12-20 LAB — HEPARIN LEVEL (UNFRACTIONATED): Heparin Unfractionated: 0.27 [IU]/mL — ABNORMAL LOW (ref 0.30–0.70)

## 2022-12-20 LAB — TROPONIN I (HIGH SENSITIVITY)
Troponin I (High Sensitivity): 276 ng/L (ref <18)
Troponin I (High Sensitivity): 337 ng/L (ref <18)
Troponin I (High Sensitivity): 380 ng/L (ref <18)

## 2022-12-20 LAB — TSH: TSH: 0.659 u[IU]/mL (ref 0.350–4.500)

## 2022-12-20 LAB — D-DIMER, QUANTITATIVE: D-Dimer, Quant: 17.66 ug{FEU}/mL — ABNORMAL HIGH (ref 0.00–0.50)

## 2022-12-20 LAB — HIV ANTIBODY (ROUTINE TESTING W REFLEX): HIV Screen 4th Generation wRfx: NONREACTIVE

## 2022-12-20 LAB — MAGNESIUM: Magnesium: 2.1 mg/dL (ref 1.7–2.4)

## 2022-12-20 MED ORDER — HEPARIN BOLUS VIA INFUSION
4000.0000 [IU] | Freq: Once | INTRAVENOUS | Status: AC
  Administered 2022-12-20: 4000 [IU] via INTRAVENOUS
  Filled 2022-12-20: qty 4000

## 2022-12-20 MED ORDER — AMIODARONE LOAD VIA INFUSION
150.0000 mg | Freq: Once | INTRAVENOUS | Status: AC
  Administered 2022-12-20: 150 mg via INTRAVENOUS
  Filled 2022-12-20: qty 83.34

## 2022-12-20 MED ORDER — ENOXAPARIN SODIUM 40 MG/0.4ML IJ SOSY: 40.0000 mg | PREFILLED_SYRINGE | INTRAMUSCULAR | Status: DC

## 2022-12-20 MED ORDER — HEPARIN BOLUS VIA INFUSION
1100.0000 [IU] | Freq: Once | INTRAVENOUS | Status: AC
  Administered 2022-12-20: 1100 [IU] via INTRAVENOUS
  Filled 2022-12-20: qty 1100

## 2022-12-20 MED ORDER — PANTOPRAZOLE SODIUM 40 MG PO TBEC
40.0000 mg | DELAYED_RELEASE_TABLET | Freq: Every day | ORAL | Status: DC
  Administered 2022-12-20 – 2022-12-23 (×4): 40 mg via ORAL
  Filled 2022-12-20 (×4): qty 1

## 2022-12-20 MED ORDER — SODIUM CHLORIDE 0.9 % IV SOLN: INTRAVENOUS | Status: DC

## 2022-12-20 MED ORDER — ADENOSINE 6 MG/2ML IV SOLN
6.0000 mg | Freq: Once | INTRAVENOUS | Status: AC
  Administered 2022-12-20: 6 mg via INTRAVENOUS

## 2022-12-20 MED ORDER — ADENOSINE 6 MG/2ML IV SOLN
12.0000 mg | Freq: Once | INTRAVENOUS | Status: AC
  Administered 2022-12-20: 12 mg via INTRAVENOUS

## 2022-12-20 MED ORDER — ACETAMINOPHEN 325 MG PO TABS
650.0000 mg | ORAL_TABLET | Freq: Four times a day (QID) | ORAL | Status: DC | PRN
Start: 2022-12-20 — End: 2022-12-23

## 2022-12-20 MED ORDER — ADULT MULTIVITAMIN W/MINERALS CH
1.0000 | ORAL_TABLET | Freq: Every day | ORAL | Status: DC
  Administered 2022-12-20 – 2022-12-23 (×4): 1 via ORAL
  Filled 2022-12-20 (×4): qty 1

## 2022-12-20 MED ORDER — METOPROLOL TARTRATE 25 MG PO TABS
12.5000 mg | ORAL_TABLET | Freq: Four times a day (QID) | ORAL | Status: DC
Start: 2022-12-20 — End: 2022-12-21
  Administered 2022-12-20 – 2022-12-21 (×5): 12.5 mg via ORAL
  Filled 2022-12-20 (×5): qty 1

## 2022-12-20 MED ORDER — ONDANSETRON HCL 4 MG PO TABS: 4.0000 mg | ORAL_TABLET | Freq: Four times a day (QID) | ORAL | Status: DC | PRN

## 2022-12-20 MED ORDER — AMIODARONE HCL IN DEXTROSE 360-4.14 MG/200ML-% IV SOLN
30.0000 mg/h | INTRAVENOUS | Status: DC
  Administered 2022-12-20 – 2022-12-21 (×2): 30 mg/h via INTRAVENOUS
  Filled 2022-12-20 (×2): qty 200

## 2022-12-20 MED ORDER — ONDANSETRON HCL 4 MG/2ML IJ SOLN: 4.0000 mg | Freq: Four times a day (QID) | INTRAMUSCULAR | Status: DC | PRN

## 2022-12-20 MED ORDER — IOHEXOL 350 MG/ML SOLN
75.0000 mL | Freq: Once | INTRAVENOUS | Status: AC | PRN
  Administered 2022-12-20: 75 mL via INTRAVENOUS

## 2022-12-20 MED ORDER — AMIODARONE HCL IN DEXTROSE 360-4.14 MG/200ML-% IV SOLN
60.0000 mg/h | INTRAVENOUS | Status: DC
  Administered 2022-12-20 (×2): 60 mg/h via INTRAVENOUS
  Filled 2022-12-20: qty 200

## 2022-12-20 MED ORDER — HEPARIN (PORCINE) 25000 UT/250ML-% IV SOLN
1250.0000 [IU]/h | INTRAVENOUS | Status: DC
  Administered 2022-12-20: 1100 [IU]/h via INTRAVENOUS
  Administered 2022-12-21: 1250 [IU]/h via INTRAVENOUS
  Filled 2022-12-20 (×2): qty 250

## 2022-12-20 NOTE — Consult Note (Signed)
PHARMACY - ANTICOAGULATION CONSULT NOTE  Pharmacy Consult for Heparin infusion Indication:  A.Flutter  No Known Allergies  Patient Measurements: Ht 5'9" (69 inches) IBW 70.7 kg Actual weight 77.1 kg Heparin Dosing Weight: 77.1 kg  Vital Signs: Temp: 97.5 F (36.4 C) (12/10 0621) BP: 102/85 (12/10 0900) Pulse Rate: 143 (12/10 0900)  Labs: Recent Labs    12/18/22 1434 12/20/22 0659  HGB 16.4 16.3  HCT 48.1 46.6  PLT 270 234  CREATININE 1.10 1.27*   Estimated Creatinine Clearance: 61.9 mL/min (A) (by C-G formula based on SCr of 1.27 mg/dL (H)).  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 echo 5/17: EF 60-65%, no RWMA, LV dias fxn nl, LA mildly dilated, RV sys fxn nl, PASP nl, moderate sized circumferential pericardial effusion was identified, 2.12 cm around the LV free wall, <1 cm around the RV free wall. Features were not c/w tamponade physiology   PTSD (post-traumatic stress disorder)    Witnessed brother's suicide.   Restless leg syndrome    Seizures (HCC)    Syncope     Medications:  NO AC prior to admission. Enoxaparin 40mg  [place] for Dvt prophylaxis ordered on transfer to 2A, no dose received.  Assessment: Patient previously at inpatient behavioral health unit. Admitted to cardiac unit with SVT and new onset A.Flutter. PMH includes major depressive failure with psychosis, hypertension, substance abuse. Pharmacy consulted to manage heparin infusion.  Baseline CBC and BMP completed. Plt appropriate to start full AC therapy.  Orders placed for baseline INT and aPTT (will draw prior to start heparin infusion, no need to  wait for results to start infusion).  Goal of Therapy:  Heparin level 0.3-0.7 units/ml Monitor platelets by anticoagulation protocol: Yes   Plan:  DC enoxaparin [place] per protocol Give 4000 units bolus x 1 Start heparin infusion at 1100 units/hr Check anti-Xa level in 6 hours and daily while on heparin Continue to monitor H&H and platelets  Crystalyn Delia Rodriguez-Guzman PharmD, BCPS 12/20/2022 9:29 AM

## 2022-12-20 NOTE — Progress Notes (Signed)
  [facility] Adult Case Management Discharge Plan :  Will you be returning to the same living situation after discharge:  No. Pt discharged to medical floor. At discharge, do you have transportation home?: Yes,  pt denied any transportation issues.  Do you have the ability to pay for your medications: Yes,  [facility] Tailored Plan.   Release of information consent forms completed and in the chart;  Patient's signature needed at discharge.  Patient to Follow up at:  Follow-up Information     [facility], [facility]. Go to.   Why: In person appointment scheduled for 12/28/22 at 11 AM. Contact information: [address] [place] [address] [phone]                 Next level of care provider has access to [facility] Link:no  Safety Planning and Suicide Prevention discussed: Yes,  SPE completed with pt.      Has patient been referred to the Quitline?: Patient refused referral for treatment  Patient has been referred for addiction treatment: No known substance use disorder.  Glenis Smoker, LCSW 12/20/2022, 10:47 AM

## 2022-12-20 NOTE — H&P (Addendum)
Psychiatric Admission Assessment Adult  Patient Identification: [name] MRN:  [id number] Date of Evaluation:  12/19/2022 Chief Complaint:  Psych Principal Diagnosis: Bipolar I disorder, most recent episode depressed (HCC) Diagnosis:  Principal Problem:   Bipolar I disorder, most recent episode depressed (HCC) Active Problems:   PTSD (post-traumatic stress disorder)  History of Present Illness: 62 year old Caucasian male with a history of bipolar disorder, presenting to the ED with suicidal ideation (SI) and a plan to hang himself. He reports that his depressive symptoms, including SI, began approximately one week ago after running out of his prescribed medications. The patient is uncertain why this occurred, as he is a client at [facility] and should have access to his medications.  The patient describes: Associated Signs/Symptoms: Depression Symptoms:  depressed mood, insomnia, difficulty concentrating, impaired memory, panic attacks, loss of energy/fatigue, disturbed sleep, (Hypo) Manic Symptoms:  Labiality of Mood, Anxiety Symptoms:  Excessive Worry, Social Anxiety, Psychotic Symptoms:  Hallucinations: Auditory Command:  "telling me to hurt myself" PTSD Symptoms: Negative Total Time spent with patient: 1 hour  Past Psychiatric History: Bipolar Substnace abuse  Is the patient at risk to self? Yes.    Has the patient been a risk to self in the past 6 months? Yes.    Has the patient been a risk to self within the distant past? Yes.    Is the patient a risk to others? No.  Has the patient been a risk to others in the past 6 months? No.  Has the patient been a risk to others within the distant past? No.   [place] Scale:  Flowsheet Row Admission (Discharged) from 12/18/2022 in [facility] INPATIENT BEHAVIORAL MEDICINE Most recent reading at 12/18/2022  8:00 PM ED from 12/18/2022 in [facility] Emergency Department at [facility] Most recent reading at  12/18/2022  2:35 PM Admission (Discharged) from 09/15/2022 in [facility] BEHAVIORAL MEDICINE Most recent reading at 09/15/2022 10:00 PM  C-SSRS RISK CATEGORY High Risk High Risk Moderate Risk        Prior Inpatient Therapy: Yes.   If yes, describe inpatient  Prior Outpatient Therapy: Yes.   If yes, describe [facility]   Alcohol Screening:   Substance Abuse History in the last 12 months:  No. 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 echo 5/17: EF 60-65%, no RWMA, LV dias fxn nl, LA mildly dilated, RV sys fxn nl, PASP nl, moderate sized circumferential pericardial effusion was identified, 2.12 cm around the LV free wall, <1 cm around the RV free wall. Features were not c/w tamponade physiology   PTSD (post-traumatic stress disorder)    Witnessed brother's suicide.   Restless leg syndrome    Seizures (HCC)    Syncope     Past Surgical History:  Procedure Laterality Date   CYSTOSCOPY WITH URETEROSCOPY AND STENT PLACEMENT     ESOPHAGOGASTRODUODENOSCOPY N/A 01/11/2016   Procedure: ESOPHAGOGASTRODUODENOSCOPY (EGD);  Surgeon: Charlott Rakes, MD;  Location:  [facility] ENDOSCOPY;  Service: Endoscopy;  Laterality: N/A;   ESOPHAGOGASTRODUODENOSCOPY N/A 04/09/2020   Procedure: ESOPHAGOGASTRODUODENOSCOPY (EGD);  Surgeon: [name], MD;  Location: [facility] ENDOSCOPY;  Service: Gastroenterology;  Laterality: N/A;   INCISION AND DRAINAGE ABSCESS Left 01/02/2018   Procedure: INCISION AND DRAINAGE LEFT KNEE;  Surgeon: Deeann Saint, MD;  Location:  [facility] ORS;  Service: Orthopedics;  Laterality: Left;   JOINT REPLACEMENT Right    TKR   KNEE ARTHROSCOPY Right 06/25/2014   Procedure: ARTHROSCOPY KNEE;  Surgeon: Deeann Saint, MD;  Location: [facility] ORS;  Service: Orthopedics;  Laterality: Right;  partial arthroscopic medial menisectomy   LAPAROSCOPIC APPENDECTOMY N/A 06/02/2021   Procedure: APPENDECTOMY LAPAROSCOPIC;  Surgeon: Campbell Lerner, MD;  Location: [facility] ORS;  Service: General;  Laterality: N/A;   TOTAL KNEE ARTHROPLASTY Right 04/22/2015   Procedure: TOTAL KNEE ARTHROPLASTY;  Surgeon: Deeann Saint, MD;  Location: [facility] ORS;  Service: Orthopedics;  Laterality: Right;   TOTAL KNEE ARTHROPLASTY Left 10/30/2017   Procedure: TOTAL KNEE ARTHROPLASTY;  Surgeon: Deeann Saint, MD;  Location: [facility] ORS;  Service: Orthopedics;  Laterality: Left;   TOTAL KNEE REVISION Left 01/02/2018   Procedure: poly exchange of tibia and patella left knee;  Surgeon: Deeann Saint, MD;  Location: [facility] ORS;  Service: Orthopedics;  Laterality: Left;   UMBILICAL HERNIA REPAIR  06/02/2021   Procedure: HERNIA REPAIR UMBILICAL ADULT;  Surgeon: Campbell Lerner, MD;  Location: [facility] ORS;  Service: General;;   Family History:  Family History  Problem Relation Age of Onset   CVA Mother        deceased at age 59   Depression Brother        Died by suicide at age 30   Family Psychiatric  History: see above Tobacco Screening:  Social History   Tobacco Use  Smoking Status Former   Current packs/day: 0.00   Average packs/day: 0.8 packs/day for 20.0 years (15.0 ttl pk-yrs)   Types: Cigarettes   Start date: 05/16/1964   Quit date: 05/16/1984   Years since quitting: 38.6  Smokeless Tobacco Never    BH Tobacco Counseling     Are you interested in Tobacco Cessation Medications?  N/A, patient does not use tobacco products Counseled patient on smoking cessation:  N/A, patient does not use tobacco products Reason Tobacco Screening Not Completed: Patient Refused  Screening       Social History:  Social History   Substance and Sexual Activity  Alcohol Use Not Currently   Comment: rare     Social History   Substance and Sexual Activity  Drug Use Yes   Types: Marijuana, Cocaine   Comment: has not used recently    Additional Social History:      Pain Medications: See PTA Prescriptions: See PTA Over the Counter: See PTA History of alcohol / drug use?: No history of alcohol / drug abuse Longest period of sobriety (when/how long): n/a                    Allergies:  No Known Allergies Lab Results:  Results for orders placed or performed during the hospital encounter of 12/18/22 (from the past 48 hour(s))  Comprehensive metabolic panel     Status: Abnormal   Collection Time: 12/18/22  2:34 PM  Result Value Ref Range   Sodium 137 135 - 145 mmol/L   Potassium 3.9 3.5 - 5.1 mmol/L   Chloride 103 98 - 111 mmol/L   CO2 19 (L) 22 - 32 mmol/L   Glucose,  Bld 112 (H) 70 - 99 mg/dL    Comment: Glucose reference range applies only to samples taken after fasting for at least 8 hours.   BUN 16 6 - 20 mg/dL   Creatinine, Ser 1.61 0.61 - 1.24 mg/dL   Calcium 9.9 8.9 - 09.6 mg/dL   Total Protein 8.4 (H) 6.5 - 8.1 g/dL   Albumin 4.7 3.5 - 5.0 g/dL   AST 22 15 - 41 U/L   ALT 20 0 - 44 U/L   Alkaline Phosphatase 79 38 - 126 U/L   Total Bilirubin 1.2 (H) <1.2 mg/dL   GFR, Estimated >04 >54 mL/min    Comment: (NOTE) Calculated using the CKD-EPI Creatinine Equation (2021)    Anion gap 15 5 - 15    Comment: Performed at [facility], [address] [place], [place] [address]  Ethanol     Status: None   Collection Time: 12/18/22  2:34 PM  Result Value Ref Range   Alcohol, Ethyl (B) <10 <10 mg/dL    Comment: (NOTE) Lowest detectable limit for serum alcohol is 10 mg/dL.  For medical purposes only. Performed at [facility], [address] [place], [place] [address]   Salicylate level     Status: Abnormal    Collection Time: 12/18/22  2:34 PM  Result Value Ref Range   Salicylate Lvl <7.0 (L) 7.0 - 30.0 mg/dL    Comment: Performed at [facility], [address] [place], [place] [address]  Acetaminophen level     Status: Abnormal   Collection Time: 12/18/22  2:34 PM  Result Value Ref Range   Acetaminophen (Tylenol), Serum <10 (L) 10 - 30 ug/mL    Comment: (NOTE) Therapeutic concentrations vary significantly. A range of 10-30 ug/mL  may be an effective concentration for many patients. However, some  are best treated at concentrations outside of this range. Acetaminophen concentrations >150 ug/mL at 4 hours after ingestion  and >50 ug/mL at 12 hours after ingestion are often associated with  toxic reactions.  Performed at [facility], [address] [place], [place] [address]   cbc     Status: None   Collection Time: 12/18/22  2:34 PM  Result Value Ref Range   WBC 8.2 4.0 - 10.5 K/uL   RBC 5.47 4.22 - 5.81 MIL/uL   Hemoglobin 16.4 13.0 - 17.0 g/dL   HCT 57.8 46.9 - 62.9 %   MCV 87.9 80.0 - 100.0 fL   MCH 30.0 26.0 - 34.0 pg   MCHC 34.1 30.0 - 36.0 g/dL   RDW 52.8 41.3 - 24.4 %   Platelets 270 150 - 400 K/uL   nRBC 0.0 0.0 - 0.2 %    Comment: Performed at [facility], [address] [place], [place] [address]  Urine Drug Screen, Qualitative     Status: None   Collection Time: 12/18/22  2:34 PM  Result Value Ref Range   Tricyclic, Ur Screen NONE DETECTED NONE DETECTED   Amphetamines, Ur Screen NONE DETECTED NONE DETECTED   MDMA (Ecstasy)Ur Screen NONE DETECTED NONE DETECTED   Cocaine Metabolite,Ur [place] NONE DETECTED NONE DETECTED   Opiate, Ur Screen NONE DETECTED NONE DETECTED   Phencyclidine (PCP) Ur S NONE DETECTED NONE DETECTED   Cannabinoid 50 Ng, Ur [place] NONE DETECTED NONE DETECTED   Barbiturates, Ur Screen NONE DETECTED NONE DETECTED   Benzodiazepine, Ur Scrn NONE DETECTED NONE DETECTED   Methadone Scn, Ur NONE DETECTED NONE DETECTED  Comment: (NOTE) Tricyclics + metabolites, urine    Cutoff 1000 ng/mL Amphetamines + metabolites, urine  Cutoff 1000 ng/mL MDMA (Ecstasy), urine              Cutoff 500 ng/mL Cocaine Metabolite, urine          Cutoff 300 ng/mL Opiate + metabolites, urine        Cutoff 300 ng/mL Phencyclidine (PCP), urine         Cutoff 25 ng/mL Cannabinoid, urine                 Cutoff 50 ng/mL Barbiturates + metabolites, urine  Cutoff 200 ng/mL Benzodiazepine, urine              Cutoff 200 ng/mL Methadone, urine                   Cutoff 300 ng/mL  The urine drug screen provides only a preliminary, unconfirmed analytical test result and should not be used for non-medical purposes. Clinical consideration and professional judgment should be applied to any positive drug screen result due to possible interfering substances. A more specific alternate chemical method must be used in order to obtain a confirmed analytical result. Gas chromatography / mass spectrometry (GC/MS) is the preferred confirm atory method. Performed at [facility], [address] [place], [place] [address]     Blood Alcohol level:  Lab Results  Component Value Date   [facility] <10 12/18/2022   ETH <10 09/15/2022    Metabolic Disorder Labs:  Lab Results  Component Value Date   HGBA1C 5.5 04/16/2022   MPG 111 04/16/2022   MPG 105.41 01/18/2021   No results found for: "PROLACTIN" Lab Results  Component Value Date   CHOL 184 04/16/2022   TRIG 133 04/16/2022   HDL 32 (L) 04/16/2022   CHOLHDL 5.8 04/16/2022   VLDL 27 04/16/2022   LDLCALC 125 (H) 04/16/2022   LDLCALC 128 (H) 01/18/2021    Current Medications: No current facility-administered medications for this encounter.   No current outpatient medications on file.   Facility-Administered Medications Ordered in Other Encounters  Medication Dose Route Frequency Provider Last Rate Last Admin   0.9 %  sodium chloride infusion   Intravenous Continuous Floydene Flock, MD 75 mL/hr at 12/20/22 1128 New Bag at 12/20/22 1128   acetaminophen (TYLENOL) tablet 650 mg  650 mg Oral Q6H PRN Floydene Flock, MD       amiodarone (NEXTERONE PREMIX) 360-4.14 MG/200ML-% (1.8 mg/mL) IV infusion  60 mg/hr Intravenous Continuous Hammock, Sheri, NP 33.3 mL/hr at 12/20/22 1059 60 mg/hr at 12/20/22 1059   Followed by   amiodarone (NEXTERONE PREMIX) 360-4.14 MG/200ML-% (1.8 mg/mL) IV infusion  30 mg/hr Intravenous Continuous Hammock, Sheri, NP       heparin ADULT infusion 100 units/mL (25000 units/268mL)  1,100 Units/hr Intravenous Continuous Floydene Flock, MD 11 mL/hr at 12/20/22 0943 1,100 Units/hr at 12/20/22 0943   metoprolol tartrate (LOPRESSOR) tablet 12.5 mg  12.5 mg Oral Q6H Hammock, Sheri, NP   12.5 mg at 12/20/22 1056   multivitamin with minerals tablet 1 tablet  1 tablet Oral Daily Floydene Flock, MD       ondansetron [facility]) tablet 4 mg  4 mg Oral Q6H PRN Floydene Flock, MD       Or   ondansetron [facility]) injection 4 mg  4 mg Intravenous Q6H PRN Floydene Flock, MD       pantoprazole (PROTONIX) EC  tablet 40 mg  40 mg Oral Daily Floydene Flock, MD       PTA Medications: (Not in a hospital admission)   Musculoskeletal: Strength & Muscle Tone: within normal limits Gait & Station: normal Patient leans: N/A            Psychiatric Specialty Exam:  Presentation  General Appearance:  Appropriate for Environment  Eye Contact: Fair  Speech: Clear and Coherent  Speech Volume: Normal  Handedness: Right   Mood and Affect  Mood: Anxious; Depressed  Affect: Congruent; Constricted   Thought Process  Thought Processes: Coherent; Goal Directed  Duration of Psychotic Symptoms:N/A Past Diagnosis of Schizophrenia or Psychoactive disorder: No  Descriptions of Associations:Intact  Orientation:Full (Time, Place and Person)  Thought Content:Abstract Reasoning; Logical; Rumination  Hallucinations:No data recorded Ideas of  Reference:None  Suicidal Thoughts:No data recorded Homicidal Thoughts:No data recorded  Sensorium  Memory: Immediate Fair; Recent Fair; Remote Fair  Judgment: Impaired  Insight: Shallow   Executive Functions  Concentration: Fair  Attention Span: Fair  Recall: Fair  Fund of Knowledge: Good  Language: Fair   Psychomotor Activity  Psychomotor Activity:No data recorded  Assets  Assets: Communication Skills; Financial Resources/Insurance   Sleep  Sleep:No data recorded   Physical Exam: Physical Exam Vitals and nursing note reviewed.  Constitutional:      Appearance: Normal appearance.  HENT:     Head: Normocephalic and atraumatic.     Nose: Nose normal.  Pulmonary:     Effort: Pulmonary effort is normal.  Musculoskeletal:        General: Normal range of motion.     Cervical back: Normal range of motion.  Neurological:     General: No focal deficit present.     Mental Status: He is alert and oriented to person, place, and time. Mental status is at baseline.  Psychiatric:        Attention and Perception: Attention normal. He perceives auditory hallucinations.        Mood and Affect: Mood is anxious. Affect is blunt and flat.        Speech: Speech normal.        Behavior: Behavior normal. Behavior is cooperative.        Thought Content: Thought content includes suicidal ideation.        Cognition and Memory: Cognition and memory normal.        Judgment: Judgment normal.    ROS Blood pressure (!) 143/103, pulse (!) 103, temperature 97.8 F (36.6 C), temperature source Oral, resp. rate 18, height 5\' 9"  (1.753 m), weight 81.6 kg, SpO2 97%. Body mass index is 26.58 kg/m.  Treatment Plan Summary: Daily contact with patient to assess and evaluate symptoms and progress in treatment and Medication management Wellbutrin XL (Bupropion) 150 mg daily depression and to help manage depressive episodes in bipolar disorder. Depakote (Divalproex Sodium) 1,000 mg  Mood stabilization in bipolar disorder. Seroquel (Quetiapine) 400 mg at bedtime bipolar depression, psychotic symptoms, and sleep disturbance. Mirtazapine (Remeron) 15 mg at bedtime Augmentation of antidepressant treatment, improvement of sleep, and appetite stimulation if needed. Educate the patient on the role of medication in managing bipolar disorder and preventing psychotic symptoms  Observation Level/Precautions:  Continuous Observation 15 minute checks  Laboratory:   none at this time  Psychotherapy:    Medications:    Consultations:    Discharge Concerns:    Estimated LOS:  Other:     Physician Treatment Plan for Primary Diagnosis: Bipolar I disorder, most recent  episode depressed (HCC) Long Term Goal(s): Improvement in symptoms so as ready for discharge  Short Term Goals: Ability to identify changes in lifestyle to reduce recurrence of condition will improve, Ability to verbalize feelings will improve, Ability to disclose and discuss suicidal ideas, Ability to demonstrate self-control will improve, Ability to identify and develop effective coping behaviors will improve, Ability to maintain clinical measurements within normal limits will improve, Compliance with prescribed medications will improve, and Ability to identify triggers associated with substance abuse/mental health issues will improve  Physician Treatment Plan for Secondary Diagnosis: Principal Problem:   Bipolar I disorder, most recent episode depressed (HCC) Active Problems:   PTSD (post-traumatic stress disorder)  Long Term Goal(s): Improvement in symptoms so as ready for discharge  Short Term Goals: Ability to identify changes in lifestyle to reduce recurrence of condition will improve, Ability to verbalize feelings will improve, Ability to disclose and discuss suicidal ideas, Ability to demonstrate self-control will improve, Ability to identify and develop effective coping behaviors will improve, Ability to maintain  clinical measurements within normal limits will improve, Compliance with prescribed medications will improve, and Ability to identify triggers associated with substance abuse/mental health issues will improve  I certify that inpatient services furnished can reasonably be expected to improve the patient's condition.     Myriam Forehand, NP 12/10/20241:12 PM

## 2022-12-20 NOTE — Assessment & Plan Note (Addendum)
Persistent heart rate 160s to 170s with noted SVT on EKG Has had some transient improvement with vagal maneuvers at the bedside Mild CP, SOB since this am  Preliminary labs including potassium magnesium within normal limits Add on D-dimer, troponin, urine drug screen, TSH Check 2D echo ACLS protocol as clinically indicated Cardiology evaluated at the bedside  status post 6 mg then 12 mg of IV adenosine with persistence of heart rate into the 140s.   Some concern for underlying A-fib.   Started on amiodarone drip Followup formal cardiology recommendations

## 2022-12-20 NOTE — Assessment & Plan Note (Signed)
UDS pending  

## 2022-12-20 NOTE — Discharge Summary (Signed)
Physician Discharge Summary Note  Patient:  [name] is an 62 y.o., male MRN:  [id number] DOB:  Jun 03, 1960 Patient phone:  [phone] (home)  Patient address:   [address] [place] [address],    Date of Admission:  12/18/2022 Date of Discharge: 12/20/2022  Reason for Admission: Patient is a 62 yo male presented to the ED with suicidal ideations and a plan to hang himself, history of bipolar d/o.  The patient reported high depression with suicidal ideations starting a week ago triggered by "running out of my medications".  High anxiety with frequent panic attacks.  Sleep initiation and maintenance are poor, sleeping about 4 hours a night.   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 echo 5/17: EF 60-65%, no RWMA, LV dias fxn nl, LA mildly dilated, RV sys fxn nl, PASP nl, moderate sized circumferential pericardial effusion was identified, 2.12 cm around the LV free wall, <1 cm around the RV free wall. Features were not c/w tamponade physiology   PTSD (post-traumatic stress disorder)    Witnessed brother's suicide.   Restless leg syndrome    Seizures (HCC)    Syncope     Past Surgical History:  Procedure Laterality Date   CYSTOSCOPY WITH URETEROSCOPY AND STENT PLACEMENT     ESOPHAGOGASTRODUODENOSCOPY N/A 01/11/2016   Procedure: ESOPHAGOGASTRODUODENOSCOPY (EGD);  Surgeon:  Charlott Rakes, MD;  Location: [facility] ENDOSCOPY;  Service: Endoscopy;  Laterality: N/A;   ESOPHAGOGASTRODUODENOSCOPY N/A 04/09/2020   Procedure: ESOPHAGOGASTRODUODENOSCOPY (EGD);  Surgeon: [name], MD;  Location[facility] ENDOSCOPY;  Service: Gastroenterology;  Laterality: N/A;   INCISION AND DRAINAGE ABSCESS Left 01/02/2018   Procedure: INCISION AND DRAINAGE LEFT KNEE;  Surgeon: Deeann Saint, MD;  Location: [facility] ORS;  Service: Orthopedics;  Laterality: Left;   JOINT REPLACEMENT Right    TKR   KNEE ARTHROSCOPY Right 06/25/2014   Procedure: ARTHROSCOPY KNEE;  Surgeon: Deeann Saint, MD;  Location: [facility] ORS;  Service: Orthopedics;  Laterality: Right;  partial arthroscopic medial menisectomy   LAPAROSCOPIC APPENDECTOMY N/A 06/02/2021   Procedure: APPENDECTOMY LAPAROSCOPIC;  Surgeon: Campbell Lerner, MD;  Location: [facility] ORS;  Service: General;  Laterality: N/A;   TOTAL KNEE ARTHROPLASTY Right 04/22/2015   Procedure: TOTAL KNEE ARTHROPLASTY;  Surgeon: Deeann Saint, MD;  Location: [facility] ORS;  Service: Orthopedics;  Laterality: Right;   TOTAL KNEE ARTHROPLASTY Left 10/30/2017   Procedure: TOTAL KNEE ARTHROPLASTY;  Surgeon: Deeann Saint, MD;  Location: [facility] ORS;  Service: Orthopedics;  Laterality: Left;   TOTAL KNEE REVISION Left 01/02/2018   Procedure: poly exchange of tibia and patella left knee;  Surgeon: Deeann Saint, MD;  Location: [facility] ORS;  Service: Orthopedics;  Laterality: Left;   UMBILICAL HERNIA REPAIR  06/02/2021   Procedure: HERNIA REPAIR UMBILICAL ADULT;  Surgeon: Campbell Lerner, MD;  Location: [facility] ORS;  Service: General;;   Family  History:  Family History  Problem Relation Age of Onset   CVA Mother        deceased at age 51   Depression Brother        Died by suicide at age 58    Social History:  Social History   Substance and Sexual Activity  Alcohol Use Not Currently   Comment: rare     Social History   Substance and Sexual Activity  Drug Use Yes   Types:  Marijuana, Cocaine   Comment: has not used recently    Social History   Socioeconomic History   Marital status: Single    Spouse name: Not on file   Number of children: Not on file   Years of education: Not on file   Highest education level: Not on file  Occupational History   Not on file  Tobacco Use   Smoking status: Former    Current packs/day: 0.00    Average packs/day: 0.8 packs/day for 20.0 years (15.0 ttl pk-yrs)    Types: Cigarettes    Start date: 05/16/1964    Quit date: 05/16/1984    Years since quitting: 38.6   Smokeless tobacco: Never  Vaping Use   Vaping status: Never Used  Substance and Sexual Activity   Alcohol use: Not Currently    Comment: rare   Drug use: Yes    Types: Marijuana, Cocaine    Comment: has not used recently   Sexual activity: Not Currently  Other Topics Concern   Not on file  Social History Narrative   ** Merged History Encounter **       Social Determinants of Health   Financial Resource Strain: Not on file  Food Insecurity: Food Insecurity Present (12/18/2022)   Hunger Vital Sign    Worried About Running Out of Food in the Last Year: Sometimes true    Ran Out of Food in the Last Year: Sometimes true  Transportation Needs: Unmet Transportation Needs (12/18/2022)   PRAPARE - Administrator, Civil Service (Medical): Yes    Lack of Transportation (Non-Medical): Yes  Physical Activity: Not on file  Stress: Not on file  Social Connections: Not on file    Hospital Course:  [name] is a 62 y.o. male with medical history significant of major depressive failure with psychosis, hypertension, substance abuse presenting with SVT.  Received a call from behavioral health unit today, Dr. Mare Loan 2024 around 7:15 AM.  Staff reported that the patient response was called when patient.  Reportedly patient was having SVT, the patient reports being woken up with chest pressure, palpitations.  Noted heart rate into the 160s.  Hospitalist  Dr. Alvester Morin was consulted.  It was recommended recommended that patient be transferred to medical floor for further evaluation and stabilization.  Patient was discharged and transferred to medical floor for further evaluation and stabilization    Blood Alcohol level:  Lab Results  Component Value Date   [facility] <10 12/18/2022   ETH <10 09/15/2022    Metabolic Disorder Labs:  Lab Results  Component Value Date   HGBA1C 5.5 04/16/2022   MPG 111 04/16/2022   MPG 105.41 01/18/2021   No results found for: "PROLACTIN" Lab Results  Component Value Date   CHOL 184 04/16/2022   TRIG 133 04/16/2022   HDL 32 (L) 04/16/2022   CHOLHDL 5.8 04/16/2022   VLDL 27 04/16/2022   LDLCALC 125 (H) 04/16/2022   LDLCALC 128 (H) 01/18/2021    See  Psychiatric Specialty Exam and Suicide Risk Assessment completed by Attending Physician prior to discharge.  Discharge destination:  Other:  Medical floor for further evaluation and stabilization   Allergies as of 12/20/2022   No Known Allergies      Medication List     STOP taking these medications    Baclofen 5 MG Tabs   benztropine 1 MG tablet Commonly known as: COGENTIN   buPROPion 150 MG 24 hr tablet Commonly known as: WELLBUTRIN XL   busPIRone 5 MG tablet Commonly known as: BUSPAR   butalbital-acetaminophen-caffeine 50-325-40 MG tablet Commonly known as: FIORICET   divalproex 500 MG DR tablet Commonly known as: DEPAKOTE   haloperidol 5 MG tablet Commonly known as: HALDOL   mirtazapine 15 MG tablet Commonly known as: REMERON   pantoprazole 20 MG tablet Commonly known as: PROTONIX   QUEtiapine 400 MG tablet Commonly known as: SEROQUEL   rOPINIRole 1 MG tablet Commonly known as: REQUIP        Follow-up Information     [facility], [facility]. Go to.   Why: In person appointment scheduled for 12/28/22 at 11 AM. Contact information: [address] [place] [address] [phone]                  Follow-up recommendations:   Patient was transferred to medical floor for stabilization of SVT.  Psychiatry consult service will follow patient on medical floor.  Will recommend to continue one-to-one sitter for safety.   Signed: Lewanda Rife, MD 12/20/2022, 7:46 AM

## 2022-12-20 NOTE — Progress Notes (Signed)
Discharge Note:  Assumed primary care of Patient @0745 , patient was been assessed by the rapid response team due to increase in HR, 160's sustained and previous RN reported that patient was diaphoretic and anxious. Pt was transferred to PCU @0810  and without further incident of event.

## 2022-12-20 NOTE — Progress Notes (Signed)
Patient admitted to 2A from behavioral health via rapid response. Once admitted to 2A, Dr. Alvester Morin and Dr. Okey Dupre to bedside. Adenosine administered by this RN per orders from Dr. Okey Dupre for first 6mg  IV and then 12 mg IV with Shanda Bumps RN at bedside. After administration, no further needs from rapid response from [facility]. 2A staff to reach back out to rapid response team if any further needs.

## 2022-12-20 NOTE — Assessment & Plan Note (Signed)
Management per psych team

## 2022-12-20 NOTE — Plan of Care (Signed)
 Pt compliant with procedures on the unit  Problem: Education: Goal: Knowledge of [facility] Education information/materials will improve Outcome: Progressing Goal: Emotional status will improve Outcome: Progressing Goal: Mental status will improve Outcome: Progressing Goal: Verbalization of understanding the information provided will improve Outcome: Progressing   Problem: Activity: Goal: Interest or engagement in activities will improve Outcome: Progressing Goal: Sleeping patterns will improve Outcome: Progressing   Problem: Coping: Goal: Ability to verbalize frustrations and anger appropriately will improve Outcome: Progressing Goal: Ability to demonstrate self-control will improve Outcome: Progressing   Problem: Health Behavior/Discharge Planning: Goal: Identification of resources available to assist in meeting health care needs will improve Outcome: Progressing Goal: Compliance with treatment plan for underlying cause of condition will improve Outcome: Progressing   Problem: Physical Regulation: Goal: Ability to maintain clinical measurements within normal limits will improve Outcome: Progressing   Problem: Safety: Goal: Periods of time without injury will increase Outcome: Progressing

## 2022-12-20 NOTE — BHH Suicide Risk Assessment (Signed)
[facility] Discharge Suicide Risk Assessment   Principal Problem: <principal problem not specified> Discharge Diagnoses: Active Problems:   Major depressive disorder, recurrent severe without psychotic features (HCC)   NA, pt is being transferred to Medical floor due to sustained tachycardia, HR around 160

## 2022-12-20 NOTE — Consult Note (Signed)
Attempted to see pt in Rm 231. Nobody in room at this time. Per chart review, this is a 62 y/o male who was transferred from inpatient psychiatry unit to medical floor this morning for concerns of SVT. Spoke w/ attending psychiatrist, Dr. Marval Regal, and will hold on re-starting psychotropics until pt is more medically stable, concern that psychotropics could worsen cardiac symptoms. Psychiatry to continue to follow.

## 2022-12-20 NOTE — H&P (Signed)
History and Physical    Patient: [name] WUJ:[id number] DOB: 04-Jan-1961 DOA: (Not on file) DOS: the patient was seen and examined on 12/20/2022 PCP: [name], PA  Patient coming from: [facility]  Chief Complaint: No chief complaint on file.  HPI: [name] is a 62 y.o. male with medical history significant of major depressive failure with psychosis, hypertension, substance abuse presenting with SVT.  Received call from behavioral health unit at 7 AM about patient having SVT overnight.  Patient reports being woken up with chest pressure, palpitations.  Noted heart rate into the 160s.  No fevers or chills.  Noted urine drug screen positive for cocaine back in April of this year.  Patient denies any recent drug use.  No reported alcohol use.  Denies any prior episodes like this in the past.  No diagnosis of pericarditis back in May 2017.  As well as HFpEF in 2019.  Follows outpatient PCP.  No notes available in epic or Care Everywhere. Currently afebrile with heart rate in the 150s, blood pressure 130s over 70s.  Satting well on room air.  EKG with heart rate into the 150s showing SVT.  CBC and CMP grossly within normal limits.  Dr. Okey Dupre at the bedside.  Patient status post 6 mg then 12 mg of IV adenosine with persistence of heart rate into the 140s.  Some concern for underlying A-fib.  Started on amiodarone drip. Review of Systems: As mentioned in the history of present illness. All other systems reviewed and are negative. 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 echo 5/17: EF 60-65%, no RWMA, LV dias fxn nl, LA mildly dilated, RV sys fxn nl, PASP  nl, moderate sized circumferential pericardial effusion was identified, 2.12 cm around the LV free wall, <1 cm around the RV free wall. Features were not c/w tamponade physiology   PTSD (post-traumatic stress disorder)    Witnessed brother's suicide.   Restless leg syndrome    Seizures (HCC)    Syncope    Past Surgical History:  Procedure Laterality Date   CYSTOSCOPY WITH URETEROSCOPY AND STENT PLACEMENT     ESOPHAGOGASTRODUODENOSCOPY N/A 01/11/2016   Procedure: ESOPHAGOGASTRODUODENOSCOPY (EGD);  Surgeon: Charlott Rakes, MD;  Location: [facility] ENDOSCOPY;  Service: Endoscopy;  Laterality: N/A;   ESOPHAGOGASTRODUODENOSCOPY N/A 04/09/2020   Procedure: ESOPHAGOGASTRODUODENOSCOPY (EGD);  Surgeon: [name], MD;  Location: [facility] ENDOSCOPY;  Service: Gastroenterology;  Laterality: N/A;   INCISION AND DRAINAGE ABSCESS Left 01/02/2018   Procedure: INCISION AND DRAINAGE LEFT KNEE;  Surgeon: Deeann Saint, MD;  Location: [facility] ORS;  Service: Orthopedics;  Laterality: Left;   JOINT REPLACEMENT Right    TKR   KNEE ARTHROSCOPY Right 06/25/2014   Procedure: ARTHROSCOPY KNEE;  Surgeon: Deeann Saint, MD;  Location: [facility] ORS;  Service: Orthopedics;  Laterality: Right;  partial arthroscopic medial menisectomy   LAPAROSCOPIC APPENDECTOMY N/A 06/02/2021   Procedure: APPENDECTOMY LAPAROSCOPIC;  Surgeon: Campbell Lerner, MD;  Location: [facility] ORS;  Service: General;  Laterality: N/A;   TOTAL KNEE ARTHROPLASTY Right 04/22/2015   Procedure: TOTAL KNEE  ARTHROPLASTY;  Surgeon: Deeann Saint, MD;  Location: [facility] ORS;  Service: Orthopedics;  Laterality: Right;   TOTAL KNEE ARTHROPLASTY Left 10/30/2017   Procedure: TOTAL KNEE ARTHROPLASTY;  Surgeon: Deeann Saint, MD;  Location: [facility] ORS;  Service: Orthopedics;  Laterality: Left;   TOTAL KNEE REVISION Left 01/02/2018   Procedure: poly exchange of tibia and patella left knee;  Surgeon: Deeann Saint, MD;  Location: [facility] ORS;  Service: Orthopedics;  Laterality: Left;    UMBILICAL HERNIA REPAIR  06/02/2021   Procedure: HERNIA REPAIR UMBILICAL ADULT;  Surgeon: Campbell Lerner, MD;  Location: [facility] ORS;  Service: General;;   Social History:  reports that he quit smoking about 38 years ago. His smoking use included cigarettes. He started smoking about 58 years ago. He has a 15 pack-year smoking history. He has never used smokeless tobacco. He reports that he does not currently use alcohol. He reports current drug use. Drugs: Marijuana and Cocaine.  No Known Allergies  Family History  Problem Relation Age of Onset   CVA Mother        deceased at age 81   Depression Brother        Died by suicide at age 14    Prior to Admission medications   Not on File    Physical Exam: Vitals:   12/20/22 0805  BP: 103/78  Pulse: (!) 154  Resp: 15  SpO2: 95%   Physical Exam Constitutional:      Appearance: He is normal weight.  HENT:     Head: Normocephalic and atraumatic.     Nose: Nose normal.     Mouth/Throat:     Mouth: Mucous membranes are moist.  Eyes:     Pupils: Pupils are equal, round, and reactive to light.  Cardiovascular:     Rate and Rhythm: Normal rate and regular rhythm.  Pulmonary:     Effort: Pulmonary effort is normal.  Abdominal:     General: Bowel sounds are normal.  Musculoskeletal:        General: Normal range of motion.  Skin:    General: Skin is warm.  Neurological:     General: No focal deficit present.  Psychiatric:        Mood and Affect: Mood normal.     Data Reviewed:  There are no new results to review at this time.  CT Abdomen Pelvis W Contrast CLINICAL DATA:  Right lower quadrant abdominal pain.  EXAM: CT ABDOMEN AND PELVIS WITH CONTRAST  TECHNIQUE: Multidetector CT imaging of the abdomen and pelvis was performed using the standard protocol following bolus administration of intravenous contrast.  RADIATION DOSE REDUCTION: This exam was performed according to the departmental dose-optimization program  which includes automated exposure control, adjustment of the mA and/or kV according to patient size and/or use of iterative reconstruction technique.  CONTRAST:  OMNIPAQUE IOHEXOL 300 MG/ML  SOLN  COMPARISON:  July 11, 2021  FINDINGS: Lower chest: No acute abnormality.  Hepatobiliary: No focal liver abnormality is seen. No gallstones, gallbladder wall thickening, or biliary dilatation.  Pancreas: Unremarkable. No pancreatic ductal dilatation or surrounding inflammatory changes.  Spleen: Normal in size without focal abnormality.  Adrenals/Urinary Tract: Normal adrenal glands. There is a 1.8 cm isodense exophytic mass off of the lower pole of the right kidney, indeterminate. Left-sided renal cysts noted. No evidence of nephrolithiasis or hydronephrosis.  Stomach/Bowel: Stomach is within normal limits. Prior appendectomy. No evidence of bowel wall thickening, distention, or inflammatory changes.  Vascular/Lymphatic: Aortic atherosclerosis. No enlarged  abdominal or pelvic lymph nodes.  Reproductive: Prostate is unremarkable.  Other: No abdominal wall hernia or abnormality. No abdominopelvic ascites.  Musculoskeletal: No acute or significant osseous findings.  IMPRESSION: 1.8 cm indeterminate right renal mass due to density. Further evaluation with abdominal MRI may be considered.  Otherwise, no acute abnormalities within the abdomen or pelvis.  Electronically Signed   By: Ted Mcalpine M.D.   On: 04/13/2022 18:02 DG Hip Unilat W or Wo Pelvis 2-3 Views Right CLINICAL DATA:  Popping sensation and pain in right hip  EXAM: DG HIP (WITH OR WITHOUT PELVIS) 2-3V RIGHT  COMPARISON:  CT abdomen and pelvis done on 07/11/2021  FINDINGS: No fracture or dislocation is seen. Joint spaces in both hips appear symmetrical. No significant bony spurs are noted. There are no focal lytic lesions.  IMPRESSION: No radiographic abnormality is seen in right  hip.  Electronically Signed   By: Ernie Avena M.D.   On: 04/13/2022 14:00  Lab Results  Component Value Date   WBC 9.8 12/20/2022   HGB 16.3 12/20/2022   HCT 46.6 12/20/2022   MCV 86.8 12/20/2022   PLT 234 12/20/2022   Last metabolic panel Lab Results  Component Value Date   GLUCOSE 132 (H) 12/20/2022   NA 137 12/20/2022   K 3.5 12/20/2022   CL 107 12/20/2022   CO2 18 (L) 12/20/2022   BUN 21 (H) 12/20/2022   CREATININE 1.27 (H) 12/20/2022   GFRNONAA >60 12/20/2022   CALCIUM 9.7 12/20/2022   PHOS 5.3 (H) 06/14/2021   PROT 8.4 (H) 12/18/2022   ALBUMIN 4.7 12/18/2022   BILITOT 1.2 (H) 12/18/2022   ALKPHOS 79 12/18/2022   AST 22 12/18/2022   ALT 20 12/18/2022   ANIONGAP 12 12/20/2022    Assessment and Plan: * SVT (supraventricular tachycardia) (HCC) Persistent heart rate 160s to 170s with noted SVT on EKG Has had some transient improvement with vagal maneuvers at the bedside Mild CP, SOB since this am  Preliminary labs including potassium magnesium within normal limits Add on D-dimer, troponin, urine drug screen, TSH Check 2D echo ACLS protocol as clinically indicated Cardiology evaluated at the bedside  status post 6 mg then 12 mg of IV adenosine with persistence of heart rate into the 140s.   Some concern for underlying A-fib.   Started on amiodarone drip Followup formal cardiology recommendations   Major depressive disorder, recurrent severe without psychotic features (HCC) Management per psych team   Suicidal ideation Management per psych team    Cocaine use UDS pending       Advance Care Planning:   Code Status: Full Code   Consults: Cardiology   Family Communication: No family at the bedside   Severity of Illness: The appropriate patient status for this patient is INPATIENT. Inpatient status is judged to be reasonable and necessary in order to provide the required intensity of service to ensure the patient's safety. The patient's  presenting symptoms, physical exam findings, and initial radiographic and laboratory data in the context of their chronic comorbidities is felt to place them at high risk for further clinical deterioration. Furthermore, it is not anticipated that the patient will be medically stable for discharge from the hospital within 2 midnights of admission.   * I certify that at the point of admission it is my clinical judgment that the patient will require inpatient hospital care spanning beyond 2 midnights from the point of admission due to high intensity of service, high risk for further deterioration  and high frequency of surveillance required.*  Author: Floydene Flock, MD 12/20/2022 8:46 AM  For on call review [email or web address].

## 2022-12-20 NOTE — Significant Event (Signed)
Rapid Response Event Note   Reason for Call :  Tachycardia, diaphoretic, lightheadedness  Initial Focused Assessment:  Patient is resting comfortably and calm in bed while nursing is setting up to do an EKG. Patient states he is very lightheaded and has isolated pain in his right shoulder from a fall 4 days ago. He is visibly diaphoretic and anxious. Patient states he can feel his heart flutter/beat/can tell when it goes up and down.   Nursing stated they came in to do morning vitals and found his heart rate in the 150s-160s. BP is stable, he remains on room air saturations in the mid 90s. He denies any chest pain/nausea/vomiting/recent drug use.   Interventions: PIV placement, basic labs drawn, EKG, CBG, vagal maneuvers x 3 with 1 being successful x 20-25 minutes, requesting for hospitalist to be contacted  Plan of Care: transfer to PCU for further monitoring, issues with Dr. Alvester Morin wanting the patient to go to ED from psych though [facility] stated that is not part of protocol and he must be directly admitted to an inpatient unit. Patient assigned to room 231, awaiting further instructions to admit.  Patient transferred to room 231 by this RN and Adron Bene RN without incident. Patient remains calm and cooperative. States he is starting to feel a little nauseous. Dr. Alvester Morin, Dr. Okey Dupre and NP have arrived at bedside. This RN hands off care to [employer] (ICU Charge) and [employer] (2A Charge)   Event Summary:   MD Notified: Dr. Joanne Gavel. [place]/Dr. End Call Time: [phone] Arrival Time: [phone] End [id number]  Astrid Drafts, RN

## 2022-12-20 NOTE — [facility] Suicide Risk Assessment (Signed)
[facility] Admission Suicide Risk Assessment   Nursing information obtained from:    Demographic factors:    Current Mental Status:    Loss Factors:    Historical Factors:    Risk Reduction Factors:     Total Time spent with patient: 1.5 hours Principal Problem: Bipolar I disorder, most recent episode depressed (HCC) Diagnosis:  Principal Problem:   Bipolar I disorder, most recent episode depressed (HCC)  Subjective Data:  62 year old Caucasian male presenting to the ED with suicidal ideation (SI) and a plan to hang himself, which began a week ago after running out of his medications. He attributes his increased depression and anxiety, as well as frequent panic attacks, to being off his prescribed medications. Severe depression with suicidal thoughts triggered by medication nonadherence. Patient reports poor sleep initiation and maintenance, sleeping about 4 hours per night. Patient reports of command auditory hallucinations (telling him to kill himself) and visual hallucinations (of his deceased brother). Currently denies AH and VH. Pateint  has not taken his antidepressants for a week despite being a client at [facility] He reportsanxiety with frequent panic attacks.Patient diagnosis of bipolar disorder and multiple prior admissions for similar symptoms.The patient states, "I've been having suicidal thoughts for the past week, and I've run out of my meds. I'm not sleeping well, and my anxiety is out of control."  Continued Clinical Symptoms:    The "Alcohol Use Disorders Identification Test", Guidelines for Use in Primary Care, Second Edition.  World Science writer [facility]). Score between 0-7:  no or low risk or alcohol related problems. Score between 8-15:  moderate risk of alcohol related problems. Score between 16-19:  high risk of alcohol related problems. Score 20 or above:  warrants further diagnostic evaluation for alcohol dependence and treatment.   CLINICAL FACTORS:    Anorexia Nervosa Bipolar Disorder:   Depressive phase Alcohol/Substance Abuse/Dependencies   Musculoskeletal: Strength & Muscle Tone: within normal limits Gait & Station: normal Patient leans: N/A  Psychiatric Specialty Exam:  Presentation  General Appearance:  Appropriate for Environment  Eye Contact: Fair  Speech: Clear and Coherent  Speech Volume: Normal  Handedness: Right   Mood and Affect  Mood: Anxious; Depressed  Affect: Congruent; Constricted   Thought Process  Thought Processes: Coherent; Goal Directed  Descriptions of Associations:Intact  Orientation:Full (Time, Place and Person)  Thought Content:Abstract Reasoning; Logical; Rumination  History of Schizophrenia/Schizoaffective disorder:No  Duration of Psychotic Symptoms:Greater than six months  Hallucinations:No data recorded Ideas of Reference:None  Suicidal Thoughts:No data recorded Homicidal Thoughts:No data recorded  Sensorium  Memory: Immediate Fair; Recent Fair; Remote Fair  Judgment: Impaired  Insight: Shallow   Executive Functions  Concentration: Fair  Attention Span: Fair  Recall: Fair  Fund of Knowledge: Good  Language: Fair   Psychomotor Activity  Psychomotor Activity:No data recorded  Assets  Assets: Communication Skills; Financial Resources/Insurance   Sleep  Sleep:No data recorded   Physical Exam: Physical Exam Vitals and nursing note reviewed.  Constitutional:      Appearance: Normal appearance.  HENT:     Head: Normocephalic and atraumatic.     Nose: Nose normal.  Pulmonary:     Effort: Pulmonary effort is normal.  Musculoskeletal:        General: Normal range of motion.     Cervical back: Normal range of motion.  Neurological:     Mental Status: He is alert.  Psychiatric:        Attention and Perception: Attention normal. He perceives auditory hallucinations.  Mood and Affect: Mood is anxious. Affect is blunt and flat.         Speech: Speech is rapid and pressured.        Behavior: Behavior is withdrawn. Behavior is cooperative.        Thought Content: Thought content normal.        Cognition and Memory: Cognition and memory normal.        Judgment: Judgment normal.    ROS Blood pressure (!) 143/103, pulse (!) 103, temperature 97.8 F (36.6 C), temperature source Oral, resp. rate 18, height 5\' 9"  (1.753 m), weight 81.6 kg, SpO2 97%. Body mass index is 26.58 kg/m.   COGNITIVE FEATURES THAT CONTRIBUTE TO RISK:  None    SUICIDE RISK:   Minimal: No identifiable suicidal ideation.  Patients presenting with no risk factors but with morbid ruminations; may be classified as minimal risk based on the severity of the depressive symptoms  PLAN OF CARE:  Wellbutrin XL (Bupropion) 150 mg daily depression and to help manage depressive episodes in bipolar disorder. Depakote (Divalproex Sodium) 1,000 mg Mood stabilization in bipolar disorder. Seroquel (Quetiapine) 400 mg at bedtime bipolar depression, psychotic symptoms, and sleep disturbance. Mirtazapine (Remeron) 15 mg at bedtime Augmentation of antidepressant treatment, improvement of sleep, and appetite stimulation if needed. Educate the patient on the role of medication in managing bipolar disorder and preventing psychotic symptoms   I certify that inpatient services furnished can reasonably be expected to improve the patient's condition.   Myriam Forehand, NP 12/20/2022, 12:53 PM

## 2022-12-20 NOTE — Consult Note (Signed)
PHARMACY - ANTICOAGULATION CONSULT NOTE  Pharmacy Consult for Heparin infusion Indication:  A.Flutter  No Known Allergies  Patient Measurements: Ht 5'9" (69 inches) IBW 70.7 kg Actual weight 77.1 kg Heparin Dosing Weight: 77.1 kg  Vital Signs: BP: 94/74 (12/10 1400) Pulse Rate: 86 (12/10 1400)  Labs: Recent Labs    12/18/22 1434 12/20/22 0659 12/20/22 0912 12/20/22 0931 12/20/22 1653  HGB 16.4 16.3  --   --   --   HCT 48.1 46.6  --   --   --   PLT 270 234  --   --   --   APTT  --   --   --  27  --   LABPROT  --   --   --  14.1  --   INR  --   --   --  1.1  --   HEPARINUNFRC  --   --   --   --  0.27*  CREATININE 1.10 1.27*  --   --   --   TROPONINIHS  --   --  276*  --   --    Estimated Creatinine Clearance: 61.9 mL/min (A) (by C-G formula based on SCr of 1.27 mg/dL (H)).  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 echo 5/17: EF 60-65%, no RWMA, LV dias fxn nl, LA mildly dilated, RV sys fxn nl, PASP nl, moderate sized circumferential pericardial effusion was identified, 2.12 cm around the LV free wall, <1 cm around the RV free wall. Features were not c/w tamponade physiology   PTSD (post-traumatic stress disorder)    Witnessed brother's suicide.   Restless leg syndrome    Seizures (HCC)    Syncope     Medications:  NO AC prior to admission. Enoxaparin 40mg  Vinings for Dvt prophylaxis ordered on transfer to 2A, no dose received.  Assessment: Patient previously at inpatient behavioral health unit. Admitted to cardiac unit with SVT and new onset A.Flutter. PMH includes major depressive failure with psychosis,  hypertension, substance abuse. Pharmacy consulted to manage heparin infusion.  Baseline CBC and BMP completed. Plt appropriate to start full AC therapy.  Orders placed for baseline INT and aPTT (will draw prior to start heparin infusion, no need to wait for results to start infusion).  1210 1653 HL 0.27   subtherapeutic  Goal of Therapy:  Heparin level 0.3-0.7 units/ml Monitor platelets by anticoagulation protocol: Yes   Plan:  Give 1100 units bolus x 1 Increase heparin infusion to 1250 units/hr Check anti-Xa level in 6 hours and daily while on heparin Continue to monitor H&H and platelets  Bari Mantis PharmD Clinical Pharmacist 12/20/2022

## 2022-12-20 NOTE — Plan of Care (Signed)
  Problem: Education: Goal: Knowledge of General Education information will improve Description: Including pain rating scale, medication(s)/side effects and non-pharmacologic comfort measures Outcome: Not Met (add Reason)   Problem: Clinical Measurements: Goal: Ability to maintain clinical measurements within normal limits will improve Outcome: Not Met (add Reason) Goal: Will remain free from infection Outcome: Not Met (add Reason) Goal: Diagnostic test results will improve Outcome: Not Met (add Reason) Goal: Respiratory complications will improve Outcome: Not Met (add Reason) Goal: Cardiovascular complication will be avoided Outcome: Not Met (add Reason)   Problem: Health Behavior/Discharge Planning: Goal: Ability to manage health-related needs will improve Outcome: Not Met (add Reason)

## 2022-12-20 NOTE — Consult Note (Signed)
Cardiology Consultation   Patient ID: [name] MRN: [id number]; DOB: 04-Jul-1960  Admit date: 12/20/2022 Date of Consult: 12/20/2022  PCP:  [name], PA   [facility] Providers Cardiologist:  None      New consult completed by Dr End  Patient Profile:   [name] is a 62 y.o. male with a hx of pericarditis, hepatitis C, bipolar, anxiety, PTSD, seizures, major depressive failure with psychosis, hypertension, substance abuse who is being seen 12/20/2022 for the evaluation of SVT at the request of Dr. Alvester Morin.  History of Present Illness:   Mr. Rarick had previously been admitted to [facility] in May 2017 with acute chest pain.  He was found to have pericarditis in the setting of a viral infection.  At that time he had pain that was worse with deep inspiration.  Troponins were 0.04 negative x 2, WBCs 15, potassium 4.1, CT of the chest/abdomen/pelvis showed no evidence of PE, moderate-sized pericardial effusion, nonobstructing right renal stone, no evidence of bowel obstruction or inflammation.  Echocardiogram showed an EF of 60 to 65%, no RWMA, LV systolic function was normal, PASP normal, moderate size Pericardial effusion was seen 2.12 cm around the LV free wall this was without tamponade physiology.  He was treated with colchicine and high-dose aspirin initially discharged with high-dose ibuprofen and colchicine twice daily.  He presented to the [facility] emergency department again on 06/01/2015 with sudden onset of sharp chest pain that radiated right to left sinus pain was worse with deep inspiration.  He states that even prior his breathing sounded like popcorn.  He underwent stress testing that was considered normal and repeat echocardiogram that showed a small pericardial effusion posterior to the heart with no evidence of hemodynamic compromise and no wall motion abnormalities.  Patient had previously been in the behavioral health unit.  Overnight he had started  having SVT that he reports working him up with chest pressure, palpitations, and associated shortness of breath.  Heart rates were noted to be in the 160s.  He denied any prior episodes of this in the past.  PCP that he follows with outpatient is unavailable in epic.  With his heart rates on reports in the 160s and 170s he was transferred to progressive care unit.  Vagal maneuvers were unsuccessful.  Was given 6 mg of adenosine IV with no change in 12 mg of adenosine IV push which slowed him down momentarily to heart rate in the 140s looking like atrial flutter.  At that point he was started on amiodarone drip and heparin infusion.Labs were ordered and he was placed on a bedside cardiac monitor for continuous monitoring. He did note that his PCP had placed him on metoprolol but he was unsure the reason.   Cardiology consulted for concerns of SVT and complaints of chest pressure and shortness of breath.    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 echo 5/17: EF 60-65%, no RWMA, LV dias fxn nl, LA mildly dilated, RV sys fxn nl, PASP nl, moderate sized circumferential  pericardial effusion was identified, 2.12 cm around the LV free wall, <1 cm around the RV free wall. Features were not c/w tamponade physiology   PTSD (post-traumatic stress disorder)    Witnessed brother's suicide.   Restless leg syndrome    Seizures (HCC)    Syncope     Past Surgical History:  Procedure Laterality Date   CYSTOSCOPY WITH URETEROSCOPY AND STENT PLACEMENT     ESOPHAGOGASTRODUODENOSCOPY N/A 01/11/2016   Procedure: ESOPHAGOGASTRODUODENOSCOPY (EGD);  Surgeon: Charlott Rakes, MD;   Location[facility] ENDOSCOPY;  Service: Endoscopy;  Laterality: N/A;   ESOPHAGOGASTRODUODENOSCOPY N/A 04/09/2020   Procedure: ESOPHAGOGASTRODUODENOSCOPY (EGD);  Surgeon: [name], MD;  Location: [facility] ENDOSCOPY;  Service: Gastroenterology;  Laterality: N/A;   INCISION AND DRAINAGE ABSCESS Left 01/02/2018   Procedure: INCISION AND DRAINAGE LEFT KNEE;  Surgeon: Deeann Saint, MD;  Location: [facility] ORS;  Service: Orthopedics;  Laterality: Left;   JOINT REPLACEMENT Right    TKR   KNEE ARTHROSCOPY Right 06/25/2014   Procedure: ARTHROSCOPY KNEE;  Surgeon: Deeann Saint, MD;  Location: [facility] ORS;  Service: Orthopedics;  Laterality: Right;  partial arthroscopic medial menisectomy   LAPAROSCOPIC APPENDECTOMY N/A 06/02/2021   Procedure: APPENDECTOMY LAPAROSCOPIC;  Surgeon: Campbell Lerner, MD;  Location: [facility] ORS;  Service: General;  Laterality: N/A;   TOTAL KNEE ARTHROPLASTY Right 04/22/2015   Procedure: TOTAL KNEE ARTHROPLASTY;  Surgeon: Deeann Saint, MD;  Location: [facility] ORS;  Service: Orthopedics;  Laterality: Right;   TOTAL KNEE ARTHROPLASTY Left 10/30/2017   Procedure: TOTAL KNEE ARTHROPLASTY;  Surgeon: Deeann Saint, MD;  Location: [facility] ORS;  Service: Orthopedics;  Laterality: Left;   TOTAL KNEE REVISION Left 01/02/2018   Procedure: poly exchange of tibia and patella left knee;  Surgeon: Deeann Saint, MD;  Location: [facility] ORS;  Service: Orthopedics;  Laterality: Left;   UMBILICAL HERNIA REPAIR  06/02/2021   Procedure: HERNIA REPAIR UMBILICAL ADULT;  Surgeon: Campbell Lerner, MD;  Location: [facility] ORS;  Service: General;;     Home Medications:  Prior to Admission medications   Not on File    Inpatient Medications: Scheduled Meds:  metoprolol tartrate  12.5 mg Oral Q6H   multivitamin with minerals  1 tablet Oral Daily   pantoprazole  40 mg Oral Daily   Continuous Infusions:  sodium chloride 75 mL/hr at 12/20/22 1128   amiodarone 60 mg/hr (12/20/22 1059)   Followed by   amiodarone     heparin  1,100 Units/hr (12/20/22 0943)   PRN Meds: acetaminophen, ondansetron **OR** ondansetron (ZOFRAN) IV  Allergies:   No Known Allergies  Social History:   Social History   Socioeconomic History   Marital status: Single    Spouse name: Not on file   Number of children: Not on file   Years of education: Not on file   Highest education level: Not on file  Occupational History   Not on file  Tobacco Use   Smoking status: Former    Current packs/day: 0.00    Average packs/day: 0.8 packs/day for 20.0 years (15.0 ttl pk-yrs)    Types: Cigarettes    Start date: 05/16/1964    Quit date: 05/16/1984    Years since quitting: 38.6   Smokeless tobacco: Never  Vaping Use   Vaping status: Never Used  Substance and Sexual Activity   Alcohol use: Not Currently    Comment: rare   Drug use: Yes    Types: Marijuana, Cocaine    Comment: has not used recently   Sexual activity: Not Currently  Other  Topics Concern   Not on file  Social History Narrative   ** Merged History Encounter **       Social Determinants of Health   Financial Resource Strain: Not on file  Food Insecurity: Food Insecurity Present (12/20/2022)   Hunger Vital Sign    Worried About Running Out of Food in the Last Year: Sometimes true    Ran Out of Food in the Last Year: Sometimes true  Transportation Needs: Unmet Transportation Needs (12/20/2022)   PRAPARE - Administrator, Civil Service (Medical): Yes    Lack of Transportation (Non-Medical): Yes  Physical Activity: Not on file  Stress: Not on file  Social Connections: Not on file  Intimate Partner Violence: Not At Risk (12/20/2022)   Humiliation, Afraid, Rape, and Kick questionnaire    Fear of Current or Ex-Partner: No    Emotionally Abused: No    Physically Abused: No    Sexually Abused: No    Family History:    Family History  Problem Relation Age of Onset   CVA Mother        deceased at age 40   Depression Brother        Died by suicide at  age 13     ROS:  Please see the history of present illness.  Review of Systems  Constitutional:  Positive for malaise/fatigue.  Respiratory:  Positive for shortness of breath.   Cardiovascular:  Positive for palpitations and PND.  Neurological:  Positive for weakness.    All other ROS reviewed and negative.     Physical Exam/Data:   Vitals:   12/20/22 0900 12/20/22 1000 12/20/22 1026 12/20/22 1056  BP: 102/85   103/83  Pulse: (!) 143 84 88 (!) 163  Resp: (!) 21 17 (!) 23   SpO2: 95%      No intake or output data in the 24 hours ending 12/20/22 1134    12/18/2022    8:22 PM 12/18/2022    2:31 PM 09/15/2022   10:00 PM  Last 3 Weights  Weight (lbs)  180 lb   Weight (kg)  81.647 kg      Information is confidential and restricted. Go to Review Flowsheets to unlock data.     There is no height or weight on file to calculate BMI.  General:  Well nourished, well developed, in no acute distress HEENT: normal Neck: no JVD Vascular: No carotid bruits; Distal pulses 2+ bilaterally Cardiac:  normal S1, S2; RRR; tachycardic no murmur  Lungs:  clear to auscultation bilaterally, no wheezing, rhonchi or rales, pressures are unlabored at rest on room air Abd: soft, nontender, no hepatomegaly  Ext: no edema Musculoskeletal:  No deformities, BUE and BLE strength normal and equal Skin: warm and dry  Neuro:  CNs 2-12 intact, no focal abnormalities noted Psych:  Normal affect   EKG:  The EKG was personally reviewed and demonstrates: Supraventricular tachycardia with rate of 154, LVH, ST depression noted in inferior lateral leads Telemetry:  Telemetry was personally reviewed and demonstrates: Narrow complex tachycardia with rates of 140-150, during adenosine noted underlying possible atrial flutter  Relevant CV Studies: 2D echo 11/03/2017 Study Conclusions  - Left ventricle: The cavity size was normal. Systolic function was    normal. The estimated ejection fraction was in the range of  60%    to 65%. Wall motion was normal; there were no regional wall    motion abnormalities. Left ventricular diastolic function    parameters were  normal.  - Mitral valve: There was mild regurgitation.  - Left atrium: The atrium was mildly dilated.  - Right ventricle: Systolic function was normal.  - Pulmonary arteries: Systolic pressure was within the normal    range.  - Pericardium, extracardiac: There was moderate calcification.   2D echo 01/07/2016 Study Conclusions  - Left ventricle: The cavity size was normal. Systolic function was    normal. The estimated ejection fraction was in the range of 60%    to 65%. Wall motion was normal; there were no regional wall    motion abnormalities. Left ventricular diastolic function    parameters were normal.  - Mitral valve: There was mild regurgitation.  - Left atrium: The atrium was mildly dilated.  - Right ventricle: Systolic function was normal.  - Pulmonary arteries: Systolic pressure was within the normal    range.   Impressions:  - Normal study.   Lexiscan Myoview 06/02/2015 Pharmacological myocardial perfusion imaging study with no significant  ischemia Normal wall motion, EF estimated at 73% No EKG changes concerning for ischemia at peak stress or in recovery. Low risk scan  Laboratory Data:  High Sensitivity Troponin:   Recent Labs  Lab 12/20/22 0912  TROPONINIHS 276*     Chemistry Recent Labs  Lab 12/18/22 1434 12/20/22 0659  NA 137 137  K 3.9 3.5  CL 103 107  CO2 19* 18*  GLUCOSE 112* 132*  BUN 16 21*  CREATININE 1.10 1.27*  CALCIUM 9.9 9.7  MG  --  2.1  GFRNONAA >60 >60  ANIONGAP 15 12    Recent Labs  Lab 12/18/22 1434  PROT 8.4*  ALBUMIN 4.7  AST 22  ALT 20  ALKPHOS 79  BILITOT 1.2*   Lipids No results for input(s): "CHOL", "TRIG", "HDL", "LABVLDL", "LDLCALC", "CHOLHDL" in the last 168 hours.  Hematology Recent Labs  Lab 12/18/22 1434 12/20/22 0659  WBC 8.2 9.8  RBC 5.47 5.37  HGB 16.4  16.3  HCT 48.1 46.6  MCV 87.9 86.8  MCH 30.0 30.4  MCHC 34.1 35.0  RDW 13.1 13.3  PLT 270 234   Thyroid  Recent Labs  Lab 12/20/22 0912  TSH 0.659    BNPNo results for input(s): "BNP", "PROBNP" in the last 168 hours.  DDimer No results for input(s): "DDIMER" in the last 168 hours.   Radiology/Studies:  DG Chest Port 1 View  Result Date: 12/20/2022 CLINICAL DATA:  Supraventricular tachycardia. EXAM: PORTABLE CHEST 1 VIEW COMPARISON:  June 13, 2021. FINDINGS: The heart size and mediastinal contours are within normal limits. Stable scarring noted laterally in right midlung. No acute pulmonary disease is noted. The visualized skeletal structures are unremarkable. IMPRESSION: No active disease. Electronically Signed   By: Lupita Raider M.D.   On: 12/20/2022 10:21     Assessment and Plan:   SVT was suspected underlying atrial flutter/fibrillation -elevated heart rates woke him from sleep -noted to be 150-160 -complaints of chest pain, shortness of breath, and palpitations -Vagal with maneuvers unsuccessful -Connected to the bedside cardiac monitor and defibrillator,  given adenosine 6 mg IVP and adenosine 12 mg IVP -Momentarily slowing revealed underlying atrial flutter/fibrillation -He was started on amiodarone infusion and given amiodarone bolus of 150 mg IVP -Heparin infusion started per pharmacy protocol -Will likely not require long-term OAC due to CHA2DS2-VASc score of 1 -Echocardiogram ordered and pending with further recommendations to follow -If difficult to rate control may require TEE/cardioversion -TSH 0.659 -Started on metoprolol 12.5 mg every 6  hours for better rate control as long as blood pressure tolerates -d-dimer elevated troponins ordered -Continue with telemetry monitoring -EKG as needed for changes  Elevated high-sensitivity troponin -Patient initially presented with chest discomfort with elevated heart rates -Initial high-sensitivity troponin  276 -Likely supply/demand mismatch from elevated heart rate -Continue to trend -Currently chest pain-free -Continued on heparin infusion  Elevated D-dimer -D-dimer 17.66 -Recommend CTA of the chest to rule out pulmonary embolism -Recommend venous ultrasound of the bilateral lower extremities to rule out DVT -Continued on heparin infusion  Hypertension -Blood pressure 102/85 -continued on metoprolol 12.5 mg every 6 hours -vitals per unit protocol  History of pericarditis -previously treated with colchicine and ibuprofen -Echocardiogram ordered and pending with further recommendations to follow  Major depressive disorder/suicidal ideation -Continue management per psych         Risk Assessment/Risk Scores:          CHA2DS2-VASc Score = 1   This indicates a 0.6% annual risk of stroke. The patient's score is based upon: CHF History: 0 HTN History: 1 Diabetes History: 0 Stroke History: 0 Vascular Disease History: 0 Age Score: 0 Gender Score: 0          For questions or updates, please contact [facility] Please consult [email or web address] for contact info under    Signed, Vinicius Brockman, NP  12/20/2022 11:34 AM

## 2022-12-21 ENCOUNTER — Inpatient Hospital Stay: Payer: MEDICAID

## 2022-12-21 ENCOUNTER — Telehealth (HOSPITAL_COMMUNITY): Payer: Self-pay | Admitting: Pharmacy Technician

## 2022-12-21 ENCOUNTER — Other Ambulatory Visit (HOSPITAL_COMMUNITY): Payer: Self-pay

## 2022-12-21 ENCOUNTER — Inpatient Hospital Stay (HOSPITAL_COMMUNITY)
Admission: EM | Admit: 2022-12-21 | Discharge: 2022-12-21 | Disposition: A | Discharge status: Home / Self Care | Payer: MEDICAID | Attending: Family Medicine | Admitting: Family Medicine

## 2022-12-21 DIAGNOSIS — I25118 Atherosclerotic heart disease of native coronary artery with other forms of angina pectoris: Secondary | ICD-10-CM

## 2022-12-21 DIAGNOSIS — F1490 Cocaine use, unspecified, uncomplicated: Secondary | ICD-10-CM

## 2022-12-21 DIAGNOSIS — R45851 Suicidal ideations: Secondary | ICD-10-CM

## 2022-12-21 DIAGNOSIS — I4892 Unspecified atrial flutter: Secondary | ICD-10-CM

## 2022-12-21 DIAGNOSIS — I483 Typical atrial flutter: Secondary | ICD-10-CM | POA: Diagnosis not present

## 2022-12-21 DIAGNOSIS — F332 Major depressive disorder, recurrent severe without psychotic features: Secondary | ICD-10-CM | POA: Diagnosis not present

## 2022-12-21 DIAGNOSIS — R9431 Abnormal electrocardiogram [ECG] [EKG]: Secondary | ICD-10-CM | POA: Diagnosis not present

## 2022-12-21 DIAGNOSIS — I471 Supraventricular tachycardia, unspecified: Secondary | ICD-10-CM | POA: Diagnosis not present

## 2022-12-21 DIAGNOSIS — R7989 Other specified abnormal findings of blood chemistry: Secondary | ICD-10-CM

## 2022-12-21 LAB — COMPREHENSIVE METABOLIC PANEL
ALT: 16 U/L (ref 0–44)
AST: 16 U/L (ref 15–41)
Albumin: 3.4 g/dL — ABNORMAL LOW (ref 3.5–5.0)
Alkaline Phosphatase: 58 U/L (ref 38–126)
Anion gap: 9 (ref 5–15)
BUN: 24 mg/dL — ABNORMAL HIGH (ref 6–20)
CO2: 20 mmol/L — ABNORMAL LOW (ref 22–32)
Calcium: 9.1 mg/dL (ref 8.9–10.3)
Chloride: 105 mmol/L (ref 98–111)
Creatinine, Ser: 1.29 mg/dL — ABNORMAL HIGH (ref 0.61–1.24)
GFR, Estimated: >60 mL/min (ref >60)
Glucose, Bld: 99 mg/dL (ref 70–99)
Potassium: 3.2 mmol/L — ABNORMAL LOW (ref 3.5–5.1)
Sodium: 134 mmol/L — ABNORMAL LOW (ref 135–145)
Total Bilirubin: 0.8 mg/dL (ref <1.2)
Total Protein: 6.3 g/dL — ABNORMAL LOW (ref 6.5–8.1)

## 2022-12-21 LAB — URINE DRUG SCREEN, QUALITATIVE (ARMC ONLY)
Amphetamines, Ur Screen: NOT DETECTED
Barbiturates, Ur Screen: POSITIVE — AB
Benzodiazepine, Ur Scrn: NOT DETECTED
Cannabinoid 50 Ng, Ur ~~LOC~~: POSITIVE — AB
Cocaine Metabolite,Ur ~~LOC~~: NOT DETECTED
MDMA (Ecstasy)Ur Screen: NOT DETECTED
Methadone Scn, Ur: NOT DETECTED
Opiate, Ur Screen: NOT DETECTED
Phencyclidine (PCP) Ur S: NOT DETECTED
Tricyclic, Ur Screen: POSITIVE — AB

## 2022-12-21 LAB — CBC
HCT: 41.5 % (ref 39.0–52.0)
Hemoglobin: 14.3 g/dL (ref 13.0–17.0)
MCH: 30.8 pg (ref 26.0–34.0)
MCHC: 34.5 g/dL (ref 30.0–36.0)
MCV: 89.2 fL (ref 80.0–100.0)
Platelets: 189 10*3/uL (ref 150–400)
RBC: 4.65 MIL/uL (ref 4.22–5.81)
RDW: 13.8 % (ref 11.5–15.5)
WBC: 6.3 10*3/uL (ref 4.0–10.5)
nRBC: 0 % (ref 0.0–0.2)

## 2022-12-21 LAB — PHOSPHORUS: Phosphorus: 3.5 mg/dL (ref 2.5–4.6)

## 2022-12-21 LAB — ECHOCARDIOGRAM COMPLETE
AR max vel: 2.88 cm2
AV Area VTI: 2.46 cm2
AV Area mean vel: 2.28 cm2
AV Mean grad: 3 mm[Hg]
AV Peak grad: 4.5 mm[Hg]
Ao pk vel: 1.06 m/s
Area-P 1/2: 3.87 cm2
Height: 69 in
MV VTI: 3.46 cm2
S' Lateral: 2.7 cm

## 2022-12-21 LAB — MAGNESIUM: Magnesium: 1.9 mg/dL (ref 1.7–2.4)

## 2022-12-21 LAB — HEPARIN LEVEL (UNFRACTIONATED)
Heparin Unfractionated: 0.59 [IU]/mL (ref 0.30–0.70)
Heparin Unfractionated: 0.67 [IU]/mL (ref 0.30–0.70)

## 2022-12-21 LAB — T4, FREE: Free T4: 1.1 ng/dL (ref 0.61–1.12)

## 2022-12-21 MED ORDER — SODIUM BICARBONATE 650 MG PO TABS
650.0000 mg | ORAL_TABLET | Freq: Three times a day (TID) | ORAL | Status: AC
  Administered 2022-12-21 (×3): 650 mg via ORAL
  Filled 2022-12-21 (×3): qty 1

## 2022-12-21 MED ORDER — POTASSIUM CHLORIDE CRYS ER 20 MEQ PO TBCR
40.0000 meq | EXTENDED_RELEASE_TABLET | Freq: Once | ORAL | Status: AC
  Administered 2022-12-21: 40 meq via ORAL
  Filled 2022-12-21: qty 2

## 2022-12-21 MED ORDER — METOPROLOL TARTRATE 25 MG PO TABS
25.0000 mg | ORAL_TABLET | Freq: Two times a day (BID) | ORAL | Status: DC
Start: 2022-12-21 — End: 2022-12-23
  Administered 2022-12-21 – 2022-12-23 (×4): 25 mg via ORAL
  Filled 2022-12-21 (×4): qty 1

## 2022-12-21 MED ORDER — TAMSULOSIN HCL 0.4 MG PO CAPS
0.4000 mg | ORAL_CAPSULE | Freq: Every day | ORAL | Status: DC
  Administered 2022-12-21 – 2022-12-22 (×2): 0.4 mg via ORAL
  Filled 2022-12-21 (×2): qty 1

## 2022-12-21 MED ORDER — TAMSULOSIN HCL 0.4 MG PO CAPS
0.4000 mg | ORAL_CAPSULE | Freq: Once | ORAL | Status: AC
  Administered 2022-12-21: 0.4 mg via ORAL
  Filled 2022-12-21: qty 1

## 2022-12-21 MED ORDER — ALPRAZOLAM 0.5 MG PO TABS
0.5000 mg | ORAL_TABLET | Freq: Three times a day (TID) | ORAL | Status: DC | PRN
  Administered 2022-12-21 – 2022-12-23 (×5): 0.5 mg via ORAL
  Filled 2022-12-21 (×5): qty 1

## 2022-12-21 MED ORDER — ENOXAPARIN SODIUM 40 MG/0.4ML IJ SOSY
40.0000 mg | PREFILLED_SYRINGE | Freq: Every evening | INTRAMUSCULAR | Status: DC
  Administered 2022-12-21 – 2022-12-22 (×2): 40 mg via SUBCUTANEOUS
  Filled 2022-12-21 (×2): qty 0.4

## 2022-12-21 NOTE — Progress Notes (Signed)
*  PRELIMINARY RESULTS* Echocardiogram 2D Echocardiogram has been performed.  Carolyne Fiscal 12/21/2022, 12:48 PM

## 2022-12-21 NOTE — Consult Note (Signed)
PHARMACY - ANTICOAGULATION CONSULT NOTE  Pharmacy Consult for Heparin infusion Indication:  A.Flutter  No Known Allergies  Patient Measurements: Ht 5'9" (69 inches) IBW 70.7 kg Actual weight 77.1 kg Heparin Dosing Weight: 77.1 kg  Vital Signs: Temp: 97.7 F (36.5 C) (12/11 0922) Temp Source: Axillary (12/11 0922) BP: 114/84 (12/11 0922) Pulse Rate: 66 (12/11 0922)  Labs: Recent Labs    12/18/22 1434 12/20/22 0659 12/20/22 0912 12/20/22 0931 12/20/22 1653 12/20/22 1808 12/20/22 1944 12/21/22 0120 12/21/22 0826  HGB 16.4 16.3  --   --   --   --   --  14.3  --   HCT 48.1 46.6  --   --   --   --   --  41.5  --   PLT 270 234  --   --   --   --   --  189  --   APTT  --   --   --  27  --   --   --   --   --   LABPROT  --   --   --  14.1  --   --   --   --   --   INR  --   --   --  1.1  --   --   --   --   --   HEPARINUNFRC  --   --   --   --  0.27*  --   --  0.59 0.67  CREATININE 1.10 1.27*  --   --   --   --   --  1.29*  --   TROPONINIHS  --   --  276*  --   --  337* 380*  --   --    Estimated Creatinine Clearance: 60.9 mL/min (A) (by C-G formula based on SCr of 1.29 mg/dL (H)).  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 echo 5/17: EF 60-65%, no RWMA, LV dias fxn nl, LA mildly dilated, RV sys fxn nl, PASP nl, moderate sized circumferential pericardial effusion was identified, 2.12 cm around the LV free wall, <1 cm around the RV free wall. Features were not c/w tamponade physiology   PTSD (post-traumatic stress disorder)    Witnessed brother's suicide.   Restless leg syndrome    Seizures (HCC)    Syncope      Medications:  NO AC prior to admission. Enoxaparin 40mg  [place] for Dvt prophylaxis ordered on transfer to 2A, no dose received.  Assessment: Patient previously at inpatient behavioral health unit. Admitted to cardiac unit with SVT and new onset A.Flutter. PMH includes major depressive failure with psychosis, hypertension, substance abuse. Pharmacy consulted to manage heparin infusion.  Date Time Results Comments 1210 1653 HL 0.27  subtherapeutic 1211  0120  HL 0.59 Therapeutic x 1 at 1250 un/hrs 1211  0826 HL 0.67 Therapeutic x 2  Goal of Therapy:  Heparin level 0.3-0.7 units/ml Monitor platelets by anticoagulation protocol: Yes   Plan: HL remains therapeutic at current rate, H/\H  stable Continue heparin infusion at 1250 units/hr Recheck HL with AM labs Continue to monitor H&H and platelets  Yossi Hinchman Rodriguez-Guzman PharmD, BCPS 12/21/2022 9:40 AM

## 2022-12-21 NOTE — Plan of Care (Signed)
  Problem: Clinical Measurements: Goal: Will remain free from infection Outcome: Progressing   Problem: Coping: Goal: Level of anxiety will decrease Outcome: Progressing   Problem: Elimination: Goal: Will not experience complications related to urinary retention Outcome: Progressing   Problem: Safety: Goal: Ability to remain free from injury will improve Outcome: Progressing   

## 2022-12-21 NOTE — TOC Initial Note (Signed)
Transition of Care [facility]) - Initial/Assessment Note    Patient Details  Name: [name] MRN: [id number] Date of Birth: 1960/06/10  [facility]) CM/SW Contact:    Margarito Liner, LCSW Phone Number: 12/21/2022, 2:00 PM  Clinical Narrative:  Readmission prevention screen complete. CSW met with patient. Sitter at bedside. CSW introduced role and explained that discharge planning would be discussed. PCP is [name], [place]. He uses Medicaid Transportation to get to appointments. He uses the [employer]. No issues obtaining medications. Patients lives with a friend. No home health or DME use prior to admission. No further concerns. Per notes, plan to return to BMU at discharge.  Expected Discharge Plan: [facility] Barriers to Discharge: Continued Medical Work up   Patient Goals and CMS Choice            Expected Discharge Plan and Services       Living arrangements for the past 2 months: Single Family Home                                      Prior Living Arrangements/Services Living arrangements for the past 2 months: Single Family Home Lives with:: Friends Patient language and need for interpreter reviewed:: Yes Do you feel safe going back to the place where you live?: Yes      Need for Family Participation in Patient Care: Yes (Comment) Care giver support system in place?: Yes (comment)   Criminal Activity/Legal Involvement Pertinent to Current Situation/Hospitalization: No - Comment as needed  Activities of Daily Living   ADL Screening (condition at time of admission) Independently performs ADLs?: Yes (appropriate for developmental age) Is the patient deaf or have difficulty hearing?: No Does the patient have difficulty seeing, even when wearing glasses/contacts?: No Does the patient have difficulty concentrating, remembering, or making decisions?: No  Permission Sought/Granted                  Emotional  Assessment Appearance:: Appears stated age Attitude/Demeanor/Rapport: Engaged, Gracious Affect (typically observed): Accepting, Appropriate, Calm, Pleasant Orientation: : Oriented to Self, Oriented to Place, Oriented to  Time, Oriented to Situation Alcohol / Substance Use: Not Applicable Psych Involvement: Yes (comment)  Admission diagnosis:  SVT (supraventricular tachycardia) (HCC) [I47.10] Patient Active Problem List   Diagnosis Date Noted   SVT (supraventricular tachycardia) (HCC) 12/20/2022   Major depressive disorder, recurrent severe without psychotic features (HCC) 12/18/2022   PTSD (post-traumatic stress disorder) 09/16/2022   Suicidal ideation 09/15/2022   Malingering 06/28/2021   Cocaine use    Cluster B personality disorder (HCC) 05/03/2021   Essential hypertension 01/18/2021   Alcohol abuse 01/18/2021   Bipolar I disorder, most recent episode depressed (HCC)    PCP:  [name], PA Pharmacy:   [facility] [phone] - [place], [place] - [address] [place] [address] Phone: [phone] Fax: [phone]     Social Determinants of Health (SDOH) Social History: SDOH Screenings   Food Insecurity: Food Insecurity Present (12/20/2022)  Housing: Patient Declined (12/20/2022)  Transportation Needs: Unmet Transportation Needs (12/20/2022)  Utilities: At Risk (12/20/2022)  Alcohol Screen: Low Risk  (12/18/2022)  Tobacco Use: Medium Risk (12/20/2022)   SDOH Interventions:     Readmission Risk Interventions    12/21/2022    1:57 PM 06/16/2021   10:20 AM  Readmission Risk Prevention Plan  Transportation Screening Complete Complete  PCP  or Specialist Appt within 3-5 Days Complete   Social Work Consult for Recovery Care Planning/Counseling Complete   Palliative Care Screening Not Applicable   Medication Review Oceanographer) Complete Complete  PCP or Specialist appointment within 3-5 days of discharge  Complete  SW Recovery Care/Counseling Consult   Complete  Palliative Care Screening  Not Applicable  Skilled Nursing Facility  Not Applicable

## 2022-12-21 NOTE — Consult Note (Signed)
PHARMACY - ANTICOAGULATION CONSULT NOTE  Pharmacy Consult for Heparin infusion Indication:  A.Flutter  No Known Allergies  Patient Measurements: Ht 5'9" (69 inches) IBW 70.7 kg Actual weight 77.1 kg Heparin Dosing Weight: 77.1 kg  Vital Signs: Temp: 98.1 F (36.7 C) (12/10 2349) Temp Source: Axillary (12/10 2349) BP: 130/79 (12/10 2349) Pulse Rate: 91 (12/10 1848)  Labs: Recent Labs    12/18/22 1434 12/20/22 0659 12/20/22 0912 12/20/22 0931 12/20/22 1653 12/20/22 1808 12/20/22 1944 12/21/22 0120  HGB 16.4 16.3  --   --   --   --   --  14.3  HCT 48.1 46.6  --   --   --   --   --  41.5  PLT 270 234  --   --   --   --   --  189  APTT  --   --   --  27  --   --   --   --   LABPROT  --   --   --  14.1  --   --   --   --   INR  --   --   --  1.1  --   --   --   --   HEPARINUNFRC  --   --   --   --  0.27*  --   --  0.59  CREATININE 1.10 1.27*  --   --   --   --   --  1.29*  TROPONINIHS  --   --  276*  --   --  337* 380*  --    Estimated Creatinine Clearance: 60.9 mL/min (A) (by C-G formula based on SCr of 1.29 mg/dL (H)).  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 echo 5/17: EF 60-65%, no RWMA, LV dias fxn nl, LA mildly dilated, RV sys fxn nl, PASP nl, moderate sized circumferential pericardial effusion was identified, 2.12 cm around the LV free wall, <1 cm around the RV free wall. Features were not c/w tamponade physiology   PTSD (post-traumatic stress disorder)    Witnessed brother's suicide.   Restless leg syndrome    Seizures (HCC)    Syncope     Medications:  NO AC prior to admission. Enoxaparin 40mg  [place] for  Dvt prophylaxis ordered on transfer to 2A, no dose received.  Assessment: Patient previously at inpatient behavioral health unit. Admitted to cardiac unit with SVT and new onset A.Flutter. PMH includes major depressive failure with psychosis, hypertension, substance abuse. Pharmacy consulted to manage heparin infusion.  Baseline CBC and BMP completed. Plt appropriate to start full AC therapy.  Orders placed for baseline INT and aPTT (will draw prior to start heparin infusion, no need to wait for results to start infusion).  1210 1653 HL 0.27    subtherapeutic 1211 0120 HL 0.59 Therapeutic x 1  Goal of Therapy:  Heparin level 0.3-0.7 units/ml Monitor platelets by anticoagulation protocol: Yes   Plan:  Continue heparin infusion at 1250 units/hr Recheck HL in 6 hrs  to confirm Continue to monitor H&H and platelets  Otelia Sergeant, PharmD, [facility] 12/21/2022 3:58 AM

## 2022-12-21 NOTE — Plan of Care (Signed)
  Problem: Clinical Measurements: Goal: Respiratory complications will improve Outcome: Progressing   Problem: Clinical Measurements: Goal: Cardiovascular complication will be avoided Outcome: Progressing   Problem: Activity: Goal: Risk for activity intolerance will decrease Outcome: Progressing   Problem: Pain Management: Goal: General experience of comfort will improve Outcome: Progressing   Problem: Safety: Goal: Ability to remain free from injury will improve Outcome: Progressing

## 2022-12-21 NOTE — Telephone Encounter (Signed)
Pharmacy Patient Advocate Encounter  Insurance verification completed.    The patient is insured through [employer] [place].     Ran test claim for Eliquis 5mg  and the current 30 day co-pay is 4.00.  Ran test claim for Xarelto 20mg  and the current 30 day co-pay is 4.00.  This test claim was processed through [facility]- copay amounts may vary at other pharmacies due to pharmacy/plan contracts, or as the patient moves through the different stages of their insurance plan.

## 2022-12-21 NOTE — Progress Notes (Addendum)
[facility] Progress Note  Patient: [name]    NWG:[id number]  DOA: 12/20/2022     Date of Service: the patient was seen and examined on 12/21/2022  No chief complaint on file.  Brief hospital course: [name] is a 62 y.o. male with medical history significant of major depressive failure with psychosis, hypertension, substance abuse presenting with SVT.  Received call from behavioral health unit at 7 AM about patient having SVT overnight.  Patient reports being woken up with chest pressure, palpitations.  Noted heart rate into the 160s.  No fevers or chills.  Noted urine drug screen positive for cocaine back in April of this year.  Patient denies any recent drug use.  No reported alcohol use.  Denies any prior episodes like this in the past.  No diagnosis of pericarditis back in May 2017.  As well as HFpEF in 2019.  Follows outpatient PCP.  No notes available in epic or Care Everywhere. Currently afebrile with heart rate in the 150s, blood pressure 130s over 70s.  Satting well on room air.  EKG with heart rate into the 150s showing SVT.  CBC and CMP grossly within normal limits.  Dr. Okey Dupre at the bedside.  Patient status post 6 mg then 12 mg of IV adenosine with persistence of heart rate into the 140s.  Some concern for underlying A-fib.  Started on amiodarone drip.  Assessment and Plan:  # Paroxysmal SVTs, converted back to normal sinus rhythm Heart rate 160--170 Troponin 3 likely elevated most likely demand ischemia TSH 0.6, free T41.1 Within normal range D-dimer elevated, CTA negative for PE Lower extremity venous duplex negative for DVT Continue monitor on telemetry S/p heparin IV infusion, discontinued as per cardiology S/p adenosine 6 mg followed by 12 mg given S/p amiodarone IV infusion Started Lopressor 25 mg p.o. twice daily TTE LVEF 65%, no wall motion overload.  Moderate LV hypertrophy. Patient was seen by cardiology, recommended no anticoagulation,  continue metoprolol and patient is cleared to discharge to behavioral health. We will continue to monitor on telemetry tonight, possible discharge tomorrow a.m. to behavioral health.  # Metabolic acidosis Started oral bicarbonate Monitor BMP daily  #Hypokalemia Potassium 3.2 repleted cautiously elevated creatinine Monitor electrolytes daily   # Major depressive disorder, recurrent severe without psychotic features (HCC) Management per psych team    # Suicidal ideation Management per psych team      # Cocaine use UDS positive barbiturate, THC, tricyclic Drug abuse appearance counseling done   # Urinary retention Elevated creatinine could be due to urinary retention Monitor renal functions daily In-N-Out catheter was done 1 time, patient was again retaining urine and requested Foley catheter insertion 12/11 Foley catheter was inserted, total urine output 800 mL keep Foley for 7 days and follow-up with urology for voiding trial Started Flomax  Body mass index is 25.1 kg/m.  Interventions:  Diet: Heart healthy/carb modified diet DVT Prophylaxis: Subcutaneous Lovenox   Advance goals of care discussion: Full code  Family Communication: family was not present at bedside, at the time of interview.  The pt provided permission to discuss medical plan with the family. Opportunity was given to ask question and all questions were answered satisfactorily.   Disposition:  Pt is from BHU due to suicidal thoughts, admitted with SVTs, still has electrolyte imbalance, on telemetry, which precludes a safe discharge. Discharge to [facility], when stable, most likely tomorrow a.m.  Subjective: No significant events overnight, patient was complaining of urinary retention, denied  any chest pain or palpitation, still has suicidal thoughts.  No nasal complaints.  Physical Exam: General: NAD, lying comfortably Appear in no distress, affect appropriate Eyes: PERRLA ENT: Oral Mucosa Clear, moist   Neck: no JVD,  Cardiovascular: S1 and S2 Present, no Murmur,  Respiratory: good respiratory effort, Bilateral Air entry equal and Decreased, no Crackles, no wheezes Abdomen: Bowel Sound present, Soft and no tenderness,  Skin: no rashes Extremities: no Pedal edema, no calf tenderness Neurologic: without any new focal findings Gait not checked due to patient safety concerns  Vitals:   12/21/22 0922 12/21/22 0926 12/21/22 1136 12/21/22 1314  BP: 114/84   105/77  Pulse: 66  66   Resp: 16     Temp: 97.7 F (36.5 C)   97.8 F (36.6 C)  TempSrc: Axillary   Axillary  SpO2: 100%   99%  Height:  5\' 9"  (1.753 m)      Intake/Output Summary (Last 24 hours) at 12/21/2022 1741 Last data filed at 12/21/2022 1000 Gross per 24 hour  Intake 1369.32 ml  Output 1150 ml  Net 219.32 ml   There were no vitals filed for this visit.  Data Reviewed: I have personally reviewed and interpreted daily labs, tele strips, imagings as discussed above. I reviewed all nursing notes, pharmacy notes, vitals, pertinent old records I have discussed plan of care as described above with RN and patient/family.  CBC: Recent Labs  Lab 12/18/22 1434 12/20/22 0659 12/21/22 0120  WBC 8.2 9.8 6.3  HGB 16.4 16.3 14.3  HCT 48.1 46.6 41.5  MCV 87.9 86.8 89.2  PLT 270 234 189   Basic Metabolic Panel: Recent Labs  Lab 12/18/22 1434 12/20/22 0659 12/21/22 0120  NA 137 137 134*  K 3.9 3.5 3.2*  CL 103 107 105  CO2 19* 18* 20*  GLUCOSE 112* 132* 99  BUN 16 21* 24*  CREATININE 1.10 1.27* 1.29*  CALCIUM 9.9 9.7 9.1  MG  --  2.1 1.9  PHOS  --   --  3.5    Studies: US Venous Img Lower Bilateral (DVT)  Result Date: 12/21/2022 CLINICAL DATA:  [id number] Positive D dimer [id number] EXAM: BILATERAL LOWER EXTREMITY VENOUS DOPPLER ULTRASOUND TECHNIQUE: Gray-scale sonography with graded compression, as well as color Doppler and duplex ultrasound were performed to evaluate the lower extremity deep venous systems from the  level of the common femoral vein and including the common femoral, femoral, profunda femoral, popliteal and calf veins including the posterior tibial, peroneal and gastrocnemius veins when visible. The superficial great saphenous vein was also interrogated. Spectral Doppler was utilized to evaluate flow at rest and with distal augmentation maneuvers in the common femoral, femoral and popliteal veins. COMPARISON:  CTA chest, concurrent.  CT AP, 04/13/2022. FINDINGS: RIGHT LOWER EXTREMITY VENOUS Normal compressibility of the RIGHT common femoral, superficial femoral, and popliteal veins, as well as the visualized calf veins. Visualized portions of profunda femoral vein and great saphenous vein unremarkable. No filling defects to suggest DVT on grayscale or color Doppler imaging. Doppler waveforms show normal direction of venous flow, normal respiratory plasticity and response to augmentation. OTHER No evidence of superficial thrombophlebitis or abnormal fluid collection. Limitations: none LEFT LOWER EXTREMITY VENOUS Normal compressibility of the LEFT common femoral, superficial femoral, and popliteal veins, as well as the visualized calf veins. Visualized portions of profunda femoral vein and great saphenous vein unremarkable. No filling defects to suggest DVT on grayscale or color Doppler imaging. Doppler waveforms show normal direction of venous flow,  normal respiratory plasticity and response to augmentation. OTHER No evidence of superficial thrombophlebitis or abnormal fluid collection. Limitations: none IMPRESSION: No evidence of femoropopliteal DVT or superficial thrombophlebitis within either lower extremity. Roanna Banning, MD [facility] Electronically Signed   By: Roanna Banning M.D.   On: 12/21/2022 11:23    Scheduled Meds:  metoprolol tartrate  25 mg Oral BID   multivitamin with minerals  1 tablet Oral Daily   pantoprazole  40 mg Oral Daily    sodium bicarbonate  650 mg Oral TID   tamsulosin  0.4 mg Oral QPC supper   Continuous Infusions: PRN Meds: acetaminophen, ALPRAZolam, ondansetron **OR** ondansetron (ZOFRAN) IV  Time spent:55 minutes  Author: Gillis Santa. MD [facility] 12/21/2022 5:41 PM  To reach On-call, see care teams to locate the attending and reach out to them via [email or web address]. If 7PM-7AM, please contact night-coverage If you still have difficulty reaching the attending provider, please page the [facility] (Director on Call) for [facility] on amion for assistance.

## 2022-12-21 NOTE — Consult Note (Signed)
[facility] Face-to-Face Psychiatry Consult   Reason for Consult:  psychosis Referring Physician:  Dr. Floydene Flock Patient Identification: [name] MRN:  [id number] Principal Diagnosis: Major depressive disorder, recurrent severe without psychotic features (HCC) Diagnosis:  Principal Problem:   Major depressive disorder, recurrent severe without psychotic features (HCC) Active Problems:   Cocaine use   Suicidal ideation   SVT (supraventricular tachycardia) (HCC)  Total Time spent with patient: 45 minutes  Subjective:  "out of meds"  HPI:   Pt chart reviewed and assessed face to face. 62 y/o male transferred from inpatient psychiatry to medical floor for concerns of SVT. Reports today he had initially been admitted to inpatient psychiatry because he had been "out of meds". Also reports experiencing suicidal ideations at the time with plans of hanging himself and had been hearing command auditory hallucinations to kill himself. Today reports he feels a "little better", although continues to feel depressed, anxious. When asked what he believed caused improvement in mood, states he is not sure and that he has not learned new coping skills. He reports he is supposed to follow up with [facility] for medication management and counseling but was not able to because of lack of transportation. Denies current suicidal, homicidal ideations, auditory visual hallucinations or paranoia. Does feel he needs continued inpatient psychiatry to get back on his medications after he is medically cleared. Discussed with pt he is currently under IVC and currently recommendation is inpatient psychiatry once medically cleared. He verbalized understanding  Past Psychiatric History: Cluster B personality disorder; bipolar 1 disorder; alcohol abuse; malingering; cocaine use; ptsd; mdd  Risk to Self: Denies current suicidal ideations Risk to Others: Denies current homicidal ideations Prior Inpatient Therapy: Yes Prior  Outpatient Therapy: Yes  Past Medical History:  Past Medical History:  Diagnosis Date   Anxiety    Arthritis    knees and hands   Bipolar 1 disorder, depressed (HCC)    Bipolar disorder (HCC)    Depression    GERD (gastroesophageal reflux disease)    Hepatitis    HEP "C"   History of kidney stones    Hypertension    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 echo 5/17: EF 60-65%, no RWMA, LV dias fxn nl, LA mildly dilated, RV sys fxn nl, PASP nl, moderate sized circumferential pericardial effusion was identified, 2.12 cm around the LV free wall, <1 cm around the RV free wall. Features were not c/w tamponade physiology   PTSD (post-traumatic stress disorder)    Witnessed brother's suicide.   Restless leg syndrome    Seizures (HCC)    Syncope     Past Surgical History:  Procedure Laterality Date   CYSTOSCOPY WITH URETEROSCOPY AND STENT PLACEMENT     ESOPHAGOGASTRODUODENOSCOPY N/A 01/11/2016   Procedure: ESOPHAGOGASTRODUODENOSCOPY (EGD);  Surgeon: Charlott Rakes, MD;  Location: [facility] ENDOSCOPY;  Service: Endoscopy;  Laterality: N/A;   ESOPHAGOGASTRODUODENOSCOPY N/A 04/09/2020   Procedure: ESOPHAGOGASTRODUODENOSCOPY (EGD);  Surgeon: [name], MD;  Location: [facility] ENDOSCOPY;  Service: Gastroenterology;  Laterality: N/A;   INCISION AND DRAINAGE ABSCESS Left 01/02/2018   Procedure: INCISION AND DRAINAGE LEFT KNEE;  Surgeon: Deeann Saint, MD;  Location: [facility] ORS;  Service: Orthopedics;  Laterality: Left;   JOINT REPLACEMENT Right    TKR   KNEE ARTHROSCOPY Right 06/25/2014   Procedure: ARTHROSCOPY KNEE;  Surgeon: Deeann Saint, MD;  Location: [facility] ORS;  Service: Orthopedics;  Laterality: Right;  partial  arthroscopic medial menisectomy   LAPAROSCOPIC APPENDECTOMY N/A 06/02/2021   Procedure: APPENDECTOMY LAPAROSCOPIC;  Surgeon: Campbell Lerner, MD;  Location: [facility] ORS;  Service: General;  Laterality: N/A;   TOTAL KNEE  ARTHROPLASTY Right 04/22/2015   Procedure: TOTAL KNEE ARTHROPLASTY;  Surgeon: Deeann Saint, MD;  Location: [facility] ORS;  Service: Orthopedics;  Laterality: Right;   TOTAL KNEE ARTHROPLASTY Left 10/30/2017   Procedure: TOTAL KNEE ARTHROPLASTY;  Surgeon: Deeann Saint, MD;  Location: [facility] ORS;  Service: Orthopedics;  Laterality: Left;   TOTAL KNEE REVISION Left 01/02/2018   Procedure: poly exchange of tibia and patella left knee;  Surgeon: Deeann Saint, MD;  Location: [facility] ORS;  Service: Orthopedics;  Laterality: Left;   UMBILICAL HERNIA REPAIR  06/02/2021   Procedure: HERNIA REPAIR UMBILICAL ADULT;  Surgeon: Campbell Lerner, MD;  Location: [facility] ORS;  Service: General;;   Family History:  Family History  Problem Relation Age of Onset   CVA Mother        deceased at age 45   Depression Brother        Died by suicide at age 71   Social History:  Social History   Substance and Sexual Activity  Alcohol Use Not Currently   Comment: rare     Social History   Substance and Sexual Activity  Drug Use Yes   Types: Marijuana, Cocaine   Comment: has not used recently    Social History   Socioeconomic History   Marital status: Single    Spouse name: Not on file   Number of children: Not on file   Years of education: Not on file   Highest education level: Not on file  Occupational History   Not on file  Tobacco Use   Smoking status: Former    Current packs/day: 0.00    Average packs/day: 0.8 packs/day for 20.0 years (15.0 ttl pk-yrs)    Types: Cigarettes    Start date: 05/16/1964    Quit date: 05/16/1984    Years since quitting: 38.6   Smokeless tobacco: Never  Vaping Use   Vaping status: Never Used  Substance and Sexual Activity   Alcohol use: Not Currently    Comment: rare   Drug use: Yes    Types: Marijuana, Cocaine    Comment: has not used recently   Sexual activity: Not Currently  Other Topics Concern   Not on file  Social History Narrative   ** Merged History Encounter  **       Social Determinants of Health   Financial Resource Strain: Not on file  Food Insecurity: Food Insecurity Present (12/20/2022)   Hunger Vital Sign    Worried About Running Out of Food in the Last Year: Sometimes true    Ran Out of Food in the Last Year: Sometimes true  Transportation Needs: Unmet Transportation Needs (12/20/2022)   PRAPARE - Administrator, Civil Service (Medical): Yes    Lack of Transportation (Non-Medical): Yes  Physical Activity: Not on file  Stress: Not on file  Social Connections: Not on file   Allergies:  No Known Allergies  Labs:  Results for orders placed or performed during the hospital encounter of 12/20/22 (from the past 48 hour(s))  Blood gas, venous     Status: Abnormal   Collection Time: 12/20/22  9:12 AM  Result Value Ref Range   pH, Ven 7.53 (H) 7.25 - 7.43   pCO2, Ven 24 (L) 44 - 60 mmHg   pO2, Ven 181 (H) 32 -  45 mmHg   Bicarbonate 20.1 20.0 - 28.0 mmol/L   Acid-base deficit 0.9 0.0 - 2.0 mmol/L   O2 Saturation 100 %   Patient temperature 37.0    Collection site VENOUS     Comment: Performed at [facility], [address] [place], [place] [address]  HIV Antibody (routine testing w rflx)     Status: None   Collection Time: 12/20/22  9:12 AM  Result Value Ref Range   HIV Screen 4th Generation wRfx Non Reactive Non Reactive    Comment: Performed at [facility], [address] [place], [place] [address]  TSH     Status: None   Collection Time: 12/20/22  9:12 AM  Result Value Ref Range   TSH 0.659 0.350 - 4.500 uIU/mL    Comment: Performed by a 3rd Generation assay with a functional sensitivity of <=0.01 uIU/mL. Performed at [facility], [address] [place], [place] [address]   Troponin I (High Sensitivity)     Status: Abnormal   Collection Time: 12/20/22  9:12 AM  Result Value Ref Range   Troponin I (High Sensitivity) 276 (HH) <18 ng/L    Comment: CRITICAL RESULT CALLED TO, READ BACK  BY AND VERIFIED WITH JESSICA TAYLOR AT 1128 12/20/22 DAS (NOTE) Elevated high sensitivity troponin I (hsTnI) values and significant  changes across serial measurements may suggest ACS but many other  chronic and acute conditions are known to elevate hsTnI results.  Refer to the "Links" section for chest pain algorithms and additional  guidance. Performed at [facility], [address] [place], [place] [address]   Protime-INR     Status: None   Collection Time: 12/20/22  9:31 AM  Result Value Ref Range   Prothrombin Time 14.1 11.4 - 15.2 seconds   INR 1.1 0.8 - 1.2    Comment: (NOTE) INR goal varies based on device and disease states. Performed at [facility], [address] [place], [place] [address]   APTT     Status: None   Collection Time: 12/20/22  9:31 AM  Result Value Ref Range   aPTT 27 24 - 36 seconds    Comment: Performed at [facility], [address] [place], [place] [address]  D-dimer, quantitative     Status: Abnormal   Collection Time: 12/20/22  9:31 AM  Result Value Ref Range   D-Dimer, Quant 17.66 (H) 0.00 - 0.50 ug/mL-FEU    Comment: (NOTE) At the manufacturer cut-off value of 0.5 g/mL FEU, this assay has a negative predictive value of 95-100%.This assay is intended for use in conjunction with a clinical pretest probability (PTP) assessment model to exclude pulmonary embolism (PE) and deep venous thrombosis (DVT) in outpatients suspected of PE or DVT. Results should be correlated with clinical presentation. Performed at [facility], [address] [place], [place] [address]   Heparin level (unfractionated)     Status: Abnormal   Collection Time: 12/20/22  4:53 PM  Result Value Ref Range   Heparin Unfractionated 0.27 (L) 0.30 - 0.70 IU/mL    Comment: (NOTE) The clinical reportable range upper limit is being lowered to >1.10 to align with the FDA approved guidance for the current laboratory assay.  If  heparin results are below expected values, and patient dosage has  been confirmed, suggest follow up testing of antithrombin III levels. Performed at [facility], [address] [place], [place] [address]   Troponin I (High Sensitivity)  Status: Abnormal   Collection Time: 12/20/22  6:08 PM  Result Value Ref Range   Troponin I (High Sensitivity) 337 (HH) <18 ng/L    Comment: CRITICAL VALUE NOTED. VALUE IS CONSISTENT WITH PREVIOUSLY REPORTED/CALLED VALUE MU (NOTE) Elevated high sensitivity troponin I (hsTnI) values and significant  changes across serial measurements may suggest ACS but many other  chronic and acute conditions are known to elevate hsTnI results.  Refer to the "Links" section for chest pain algorithms and additional  guidance. Performed at [facility], [address] [place], [place] [address]   Troponin I (High Sensitivity)     Status: Abnormal   Collection Time: 12/20/22  7:44 PM  Result Value Ref Range   Troponin I (High Sensitivity) 380 (HH) <18 ng/L    Comment: CRITICAL VALUE NOTED. VALUE IS CONSISTENT WITH PREVIOUSLY REPORTED/CALLED VALUE MU (NOTE) Elevated high sensitivity troponin I (hsTnI) values and significant  changes across serial measurements may suggest ACS but many other  chronic and acute conditions are known to elevate hsTnI results.  Refer to the "Links" section for chest pain algorithms and additional  guidance. Performed at [facility], [address] [place], [place] [address]   Comprehensive metabolic panel     Status: Abnormal   Collection Time: 12/21/22  1:20 AM  Result Value Ref Range   Sodium 134 (L) 135 - 145 mmol/L   Potassium 3.2 (L) 3.5 - 5.1 mmol/L   Chloride 105 98 - 111 mmol/L   CO2 20 (L) 22 - 32 mmol/L   Glucose, Bld 99 70 - 99 mg/dL    Comment: Glucose reference range applies only to samples taken after fasting for at least 8 hours.   BUN 24 (H) 6 - 20 mg/dL   Creatinine, Ser 0.98 (H)  0.61 - 1.24 mg/dL   Calcium 9.1 8.9 - 11.9 mg/dL   Total Protein 6.3 (L) 6.5 - 8.1 g/dL   Albumin 3.4 (L) 3.5 - 5.0 g/dL   AST 16 15 - 41 U/L   ALT 16 0 - 44 U/L   Alkaline Phosphatase 58 38 - 126 U/L   Total Bilirubin 0.8 <1.2 mg/dL   GFR, Estimated >14 >78 mL/min    Comment: (NOTE) Calculated using the CKD-EPI Creatinine Equation (2021)    Anion gap 9 5 - 15    Comment: Performed at [facility], [address] [place], [place] [address]  CBC     Status: None   Collection Time: 12/21/22  1:20 AM  Result Value Ref Range   WBC 6.3 4.0 - 10.5 K/uL   RBC 4.65 4.22 - 5.81 MIL/uL   Hemoglobin 14.3 13.0 - 17.0 g/dL   HCT 13.0 86.5 - 78.4 %   MCV 89.2 80.0 - 100.0 fL   MCH 30.8 26.0 - 34.0 pg   MCHC 34.5 30.0 - 36.0 g/dL   RDW 69.6 29.5 - 28.4 %   Platelets 189 150 - 400 K/uL   nRBC 0.0 0.0 - 0.2 %    Comment: Performed at [facility], [address] [place], [place] [address]  Heparin level (unfractionated)     Status: None   Collection Time: 12/21/22  1:20 AM  Result Value Ref Range   Heparin Unfractionated 0.59 0.30 - 0.70 IU/mL    Comment: (NOTE) The clinical reportable range upper limit is being lowered to >1.10 to align with the FDA approved guidance for the current laboratory assay.  If heparin results are below expected  values, and patient dosage has  been confirmed, suggest follow up testing of antithrombin III levels. Performed at [facility], [address] [place], [place] [address]   Magnesium     Status: None   Collection Time: 12/21/22  1:20 AM  Result Value Ref Range   Magnesium 1.9 1.7 - 2.4 mg/dL    Comment: Performed at [facility], [address] [place], [place] [address]  Phosphorus     Status: None   Collection Time: 12/21/22  1:20 AM  Result Value Ref Range   Phosphorus 3.5 2.5 - 4.6 mg/dL    Comment: Performed at [facility], [address] [place], [place] [address]  T4, free     Status:  None   Collection Time: 12/21/22  1:20 AM  Result Value Ref Range   Free T4 1.10 0.61 - 1.12 ng/dL    Comment: (NOTE) Biotin ingestion may interfere with free T4 tests. If the results are inconsistent with the TSH level, previous test results, or the clinical presentation, then consider biotin interference. If needed, order repeat testing after stopping biotin. Performed at [facility], [address] [place], [place] [address]   Heparin level (unfractionated)     Status: None   Collection Time: 12/21/22  8:26 AM  Result Value Ref Range   Heparin Unfractionated 0.67 0.30 - 0.70 IU/mL    Comment: (NOTE) The clinical reportable range upper limit is being lowered to >1.10 to align with the FDA approved guidance for the current laboratory assay.  If heparin results are below expected values, and patient dosage has  been confirmed, suggest follow up testing of antithrombin III levels. Performed at [facility], [address] [place], [place] [address]     Current Facility-Administered Medications  Medication Dose Route Frequency Provider Last Rate Last Admin   0.9 %  sodium chloride infusion   Intravenous Continuous Floydene Flock, MD 75 mL/hr at 12/21/22 0035 New Bag at 12/21/22 0035   acetaminophen (TYLENOL) tablet 650 mg  650 mg Oral Q6H PRN Floydene Flock, MD       amiodarone (NEXTERONE PREMIX) 360-4.14 MG/200ML-% (1.8 mg/mL) IV infusion  30 mg/hr Intravenous Continuous Hammock, Sheri, NP 16.67 mL/hr at 12/21/22 0744 30 mg/hr at 12/21/22 0744   heparin ADULT infusion 100 units/mL (25000 units/229mL)  1,250 Units/hr Intravenous Continuous Angelique Blonder, RPH 12.5 mL/hr at 12/21/22 0258 1,250 Units/hr at 12/21/22 0258   metoprolol tartrate (LOPRESSOR) tablet 12.5 mg  12.5 mg Oral Q6H Hammock, Sheri, NP   12.5 mg at 12/21/22 9528   multivitamin with minerals tablet 1 tablet  1 tablet Oral Daily Floydene Flock, MD   1 tablet at 12/20/22 1522    ondansetron (ZOFRAN) tablet 4 mg  4 mg Oral Q6H PRN Floydene Flock, MD       Or   ondansetron [facility]) injection 4 mg  4 mg Intravenous Q6H PRN Floydene Flock, MD       pantoprazole (PROTONIX) EC tablet 40 mg  40 mg Oral Daily Floydene Flock, MD   40 mg at 12/20/22 1522   potassium chloride SA (KLOR-CON M) CR tablet 40 mEq  40 mEq Oral Once Gillis Santa, MD       sodium bicarbonate tablet 650 mg  650 mg Oral TID Gillis Santa, MD       Musculoskeletal: Strength & Muscle Tone:  sitting up eating breakfast Gait & Station:  sitting up eating breakfast  Patient leans:  sitting up eating breakfast  Psychiatric Specialty Exam:  Presentation  General Appearance:  Appropriate for Environment  Eye Contact: Fair  Speech: Clear and Coherent; Slow  Speech Volume: Decreased  Handedness: Right   Mood and Affect  Mood: Anxious; Depressed  Affect: Flat  Thought Process  Thought Processes: Coherent; Goal Directed; Linear  Descriptions of Associations:Intact  Orientation:Full (Time, Place and Person)  Thought Content:Logical  History of Schizophrenia/Schizoaffective disorder:No  Duration of Psychotic Symptoms:Greater than six months  Hallucinations:Hallucinations: None  Ideas of Reference:None  Suicidal Thoughts:Suicidal Thoughts: No  Homicidal Thoughts:Homicidal Thoughts: No  Sensorium  Memory: Immediate Fair  Judgment: Intact  Insight: Fair   Art therapist  Concentration: Fair  Attention Span: Fair  Recall: [employer] of Knowledge: Fair  Language: Fair   Psychomotor Activity  Psychomotor Activity: Psychomotor Activity: Other (comment) (sitting up on bed eating breakfast)   Assets  Assets: Manufacturing systems engineer; Housing   Sleep  Sleep: Sleep: Fair   Physical Exam: Physical Exam Constitutional:      General: He is not in acute distress. Eyes:     General: No scleral icterus. Cardiovascular:     Rate and Rhythm:  Normal rate.  Pulmonary:     Effort: Pulmonary effort is normal. No respiratory distress.  Neurological:     Mental Status: He is alert and oriented to person, place, and time.  Psychiatric:        Attention and Perception: Attention and perception normal.        Mood and Affect: Mood is anxious and depressed. Affect is flat.        Behavior: Behavior normal. Behavior is cooperative.        Thought Content: Thought content normal.    Review of Systems  Constitutional:  Negative for fever.  Psychiatric/Behavioral:  Positive for depression. The patient is nervous/anxious.    Blood pressure 114/84, pulse 66, temperature 97.7 F (36.5 C), temperature source Axillary, resp. rate 16, height 5\' 9"  (1.753 m), SpO2 100%. Body mass index is 25.1 kg/m.  Treatment Plan Summary: Plan    62 y/o male admitted to medical floor from inpatient psychiatry for concerns of SVT. Pt currently under IVC and recommended for inpatient psychiatric admission once medically cleared. Currently holding on re-starting psychotropics until pt is more medically stable, concern that psychotropics could worsen cardiac symptoms. Psychiatry to continue to follow.   Disposition: Recommend psychiatric Inpatient admission when medically cleared. Supportive therapy provided about ongoing stressors.  Lauree Chandler, NP 12/21/2022 9:34 AM

## 2022-12-21 NOTE — Progress Notes (Addendum)
   Patient Name: [name] [name] Date of Encounter: 12/21/2022 [facility] Cardiologist: Yvonne Kendall, MD   Interval Summary  .    Patient seen on a.m. rounds. Resting supine, comfortable, no complaints Remains on heparin, amiodarone infusion, normal saline 75 cc/h Multiple IVs in place in right arm Telemetry reviewed, maintaining normal sinus rhythm  Vital Signs .    Vitals:   12/20/22 1848 12/20/22 1952 12/20/22 2349 12/21/22 0408  BP: 99/77 108/73 130/79 104/67  Pulse: 91     Resp:      Temp:  97.8 F (36.6 C) 98.1 F (36.7 C) 97.6 F (36.4 C)  TempSrc:  Oral Axillary Axillary  SpO2:        Intake/Output Summary (Last 24 hours) at 12/21/2022 0816 Last data filed at 12/21/2022 0355 Gross per 24 hour  Intake 1369.32 ml  Output 750 ml  Net 619.32 ml      12/18/2022    8:22 PM 12/18/2022    2:31 PM 09/15/2022   10:00 PM  Last 3 Weights  Weight (lbs)  180 lb   Weight (kg)  81.647 kg      Information is confidential and restricted. Go to Review Flowsheets to unlock data.      Telemetry/ECG    Normal sinus rhythm- Personally Reviewed  Physical Exam .   Constitutional:  oriented to person, place, and time. No distress.  HENT:  Head: Grossly normal Eyes:  no discharge. No scleral icterus.  Neck: No JVD, no carotid bruits  Cardiovascular: Regular rate and rhythm, no murmurs appreciated Pulmonary/Chest: Clear to auscultation bilaterally, no wheezes or rails Abdominal: Soft.  no distension.  no tenderness.  Musculoskeletal: Normal range of motion Neurological:  normal muscle tone. Coordination normal. No atrophy Skin: Skin warm and dry Psychiatric: normal affect, pleasant   Assessment & Plan .     Atrial flutter/fib Acute tachycardia waking him from sleep rates in the 150 up to 160 range Given adenosine 6, 12, did not convert but did show flutter Started on amiodarone bolus and infusion, converting to normal sinus rhythm yesterday -Will  recommend we discontinue amiodarone, saline -Will continue metoprolol tartrate 25 twice daily Given CHA2DS2-VASc 1, will hold off on anticoagulation unless depressed ejection fraction -Prelim echo showing normal ejection fraction  Elevated troponin Likely supply/demand mismatch in the setting of tachycardia Denies anginal symptoms Ejection fraction normal -With that said he does have significant coronary calcification multiple vessels on CT scan chest -Would recommend Myoview tomorrow to rule out high risk ischemia -Will continue heparin for now -----> addendum: On further discussion with him, he does not want a Myoview, he has had 1 before and does not think he can do it again given reaction to the medication.  As he is asymptomatic, we will arrange outpatient follow-up in clinic and consideration of cardiac CTA  Elevated D-dimer,  Value of 17 etiology unclear, CTA chest no PE Consider lower extremity venous Doppler On heparin infusion  Essential hypertension Continue metoprolol tartrate 25 twice daily  Major depressive disorder/suicidal ideation Per psychiatry    For questions or updates, please contact [facility] Please consult [email or web address] for contact info under        Signed, SHERI HAMMOCK, NP

## 2022-12-22 DIAGNOSIS — I483 Typical atrial flutter: Secondary | ICD-10-CM | POA: Diagnosis not present

## 2022-12-22 DIAGNOSIS — I25118 Atherosclerotic heart disease of native coronary artery with other forms of angina pectoris: Secondary | ICD-10-CM | POA: Diagnosis not present

## 2022-12-22 DIAGNOSIS — I471 Supraventricular tachycardia, unspecified: Secondary | ICD-10-CM | POA: Diagnosis not present

## 2022-12-22 DIAGNOSIS — F332 Major depressive disorder, recurrent severe without psychotic features: Secondary | ICD-10-CM | POA: Diagnosis not present

## 2022-12-22 LAB — MAGNESIUM: Magnesium: 2 mg/dL (ref 1.7–2.4)

## 2022-12-22 LAB — BASIC METABOLIC PANEL
Anion gap: 5 (ref 5–15)
BUN: 15 mg/dL (ref 6–20)
CO2: 24 mmol/L (ref 22–32)
Calcium: 8.9 mg/dL (ref 8.9–10.3)
Chloride: 110 mmol/L (ref 98–111)
Creatinine, Ser: 0.92 mg/dL (ref 0.61–1.24)
GFR, Estimated: >60 mL/min (ref >60)
Glucose, Bld: 86 mg/dL (ref 70–99)
Potassium: 3.6 mmol/L (ref 3.5–5.1)
Sodium: 139 mmol/L (ref 135–145)

## 2022-12-22 LAB — CBC
HCT: 38 % — ABNORMAL LOW (ref 39.0–52.0)
Hemoglobin: 13 g/dL (ref 13.0–17.0)
MCH: 30.5 pg (ref 26.0–34.0)
MCHC: 34.2 g/dL (ref 30.0–36.0)
MCV: 89.2 fL (ref 80.0–100.0)
Platelets: 182 10*3/uL (ref 150–400)
RBC: 4.26 MIL/uL (ref 4.22–5.81)
RDW: 13.6 % (ref 11.5–15.5)
WBC: 4.4 10*3/uL (ref 4.0–10.5)
nRBC: 0 % (ref 0.0–0.2)

## 2022-12-22 LAB — HEPARIN LEVEL (UNFRACTIONATED): Heparin Unfractionated: 0.24 [IU]/mL — ABNORMAL LOW (ref 0.30–0.70)

## 2022-12-22 LAB — HEPATIC FUNCTION PANEL
ALT: 11 U/L (ref 0–44)
AST: 12 U/L — ABNORMAL LOW (ref 15–41)
Albumin: 3.2 g/dL — ABNORMAL LOW (ref 3.5–5.0)
Alkaline Phosphatase: 55 U/L (ref 38–126)
Bilirubin, Direct: 0.1 mg/dL (ref 0.0–0.2)
Indirect Bilirubin: 0.5 mg/dL (ref 0.3–0.9)
Total Bilirubin: 0.6 mg/dL (ref <1.2)
Total Protein: 6 g/dL — ABNORMAL LOW (ref 6.5–8.1)

## 2022-12-22 LAB — PHOSPHORUS: Phosphorus: 2.7 mg/dL (ref 2.5–4.6)

## 2022-12-22 MED ORDER — POTASSIUM CHLORIDE CRYS ER 20 MEQ PO TBCR
40.0000 meq | EXTENDED_RELEASE_TABLET | Freq: Once | ORAL | Status: AC
  Administered 2022-12-22: 40 meq via ORAL
  Filled 2022-12-22: qty 2

## 2022-12-22 MED ORDER — CHLORHEXIDINE GLUCONATE CLOTH 2 % EX PADS
6.0000 | MEDICATED_PAD | Freq: Every day | CUTANEOUS | Status: DC
  Administered 2022-12-22: 6 via TOPICAL

## 2022-12-22 MED ORDER — TAMSULOSIN HCL 0.4 MG PO CAPS: 0.4000 mg | ORAL_CAPSULE | Freq: Every day | ORAL | Status: DC

## 2022-12-22 NOTE — Progress Notes (Signed)
He is refusing for the foley to be removed for void trial. Stated he knows he will not be able to pee because he is here. Reiterated that he will not be able to go downstairs to phsych with the foley. He said, " I don't want to be difficult. I won't be able to pee". Stated he only voided once while he was in psych and it was difficult to start the stream. Dr. Lucianne Muss notified.

## 2022-12-22 NOTE — Progress Notes (Signed)
[facility] Progress Note  Patient: [name]    JXB:[id number]  DOA: 12/20/2022     Date of Service: the patient was seen and examined on 12/22/2022  No chief complaint on file.  Brief hospital course: [name] is a 62 y.o. male with medical history significant of major depressive failure with psychosis, hypertension, substance abuse presenting with SVT.  Received call from behavioral health unit at 7 AM about patient having SVT overnight.  Patient reports being woken up with chest pressure, palpitations.  Noted heart rate into the 160s.  No fevers or chills.  Noted urine drug screen positive for cocaine back in April of this year.  Patient denies any recent drug use.  No reported alcohol use.  Denies any prior episodes like this in the past.  No diagnosis of pericarditis back in May 2017.  As well as HFpEF in 2019.  Follows outpatient PCP.  No notes available in epic or Care Everywhere. Currently afebrile with heart rate in the 150s, blood pressure 130s over 70s.  Satting well on room air.  EKG with heart rate into the 150s showing SVT.  CBC and CMP grossly within normal limits.  Dr. Okey [name] at the bedside.  Patient status post 6 mg then 12 mg of IV adenosine with persistence of heart rate into the 140s.  Some concern for underlying A-fib.  Started on amiodarone drip.  Assessment and Plan:  # Paroxysmal SVTs, converted back to normal sinus rhythm Heart rate 160--170 Troponin 3 likely elevated most likely demand ischemia TSH 0.6, free T41.1 Within normal range D-dimer elevated, CTA negative for PE Lower extremity venous duplex negative for DVT Continue monitor on telemetry S/p heparin IV infusion, discontinued as per cardiology S/p adenosine 6 mg followed by 12 mg given S/p amiodarone IV infusion Started Lopressor 25 mg p.o. twice daily TTE LVEF 65%, no wall motion overload.  Moderate LV hypertrophy. Patient was seen by cardiology, recommended no anticoagulation,  continue metoprolol and patient is cleared to discharge to behavioral health. Patient remained stable overnight, sinus rhythm, denied any complaints.  Stable to discharge to behavioral health when bed will be available.  # Metabolic acidosis, resolved S/p oral bicarbonate 650 mg p.o. 3 times daily x 3 doses given Monitor BMP daily  #Hypokalemia, resolved Potassium 3.2 repleted cautiously elevated creatinine Potassium 3.6, creatinine 0.92 improved.  Monitor electrolytes daily   # Major depressive disorder, recurrent severe without psychotic features (HCC) Management per psych team    # Suicidal ideation Management per psych team      # Cocaine use UDS positive barbiturate, THC, tricyclic Drug abuse appearance counseling done   # Urinary retention, resolved Elevated creatinine could be due to urinary retention Monitor renal functions daily In-N-Out catheter was done 1 time, patient was again retaining urine and requested Foley catheter insertion 12/11 Foley catheter was inserted, total urine output 800 mL Started Flomax 0.4 milligram p.o. nightly 12/12 Foley catheter was discontinued sooner because patient cannot go with Foley catheter to behavioral health unit due to suicidal thoughts and risk of strangulation. Patient did void 700 mL after Foley catheter removal. Continue bladder scan as needed  Body mass index is 25.1 kg/m.  Interventions:  Diet: Heart healthy/carb modified diet DVT Prophylaxis: Subcutaneous Lovenox   Advance goals of care discussion: Full code  Family Communication: family was not present at bedside, at the time of interview.  The pt provided permission to discuss medical plan with the family. Opportunity was given to  ask question and all questions were answered satisfactorily.   Disposition:  Pt is from BHU due to suicidal thoughts, admitted with SVTs, s/p amiodarone, converted back to sinus rhythm.  Electrolytes are within normal range.  Clinically  stable, medically optimized and stable to discharge to behavioral health.   Discharge to [facility], when bed will be available.     Subjective: No significant events overnight, patient denies any chest pain or palpitation.  Patient is resting overnight, would like to go back to behavioral health unit.  Initially patient does not wanted Foley catheter to be removed but later on he agreed as he cannot go with Foley catheter to BHU.   Physical Exam: General: NAD, lying comfortably Appear in no distress, affect appropriate Eyes: PERRLA ENT: Oral Mucosa Clear, moist  Neck: no JVD,  Cardiovascular: S1 and S2 Present, no Murmur,  Respiratory: good respiratory effort, Bilateral Air entry equal and Decreased, no Crackles, no wheezes Abdomen: Bowel Sound present, Soft and no tenderness,  Skin: no rashes Extremities: no Pedal edema, no calf tenderness Neurologic: without any new focal findings Gait not checked due to patient safety concerns  Vitals:   12/21/22 2308 12/22/22 0316 12/22/22 0800 12/22/22 2035  BP: 105/78 117/81  (!) 129/91  Pulse:  70  70  Resp:  14  20  Temp: 98.4 F (36.9 C) 98.6 F (37 C) 97.9 F (36.6 C) 97.8 F (36.6 C)  TempSrc: Oral Oral Oral   SpO2:  97%  96%  Height:        Intake/Output Summary (Last 24 hours) at 12/22/2022 2111 Last data filed at 12/22/2022 2035 Gross per 24 hour  Intake 240 ml  Output 2250 ml  Net -2010 ml   There were no vitals filed for this visit.  Data Reviewed: I have personally reviewed and interpreted daily labs, tele strips, imagings as discussed above. I reviewed all nursing notes, pharmacy notes, vitals, pertinent old records I have discussed plan of care as described above with RN and patient/family.  CBC: Recent Labs  Lab 12/18/22 1434 12/20/22 0659 12/21/22 0120 12/22/22 0353  WBC 8.2 9.8 6.3 4.4  HGB 16.4 16.3 14.3 13.0  HCT 48.1 46.6 41.5 38.0*  MCV 87.9 86.8 89.2 89.2  PLT 270 234 189 182   Basic Metabolic  Panel: Recent Labs  Lab 12/18/22 1434 12/20/22 0659 12/21/22 0120 12/22/22 0353  NA 137 137 134* 139  K 3.9 3.5 3.2* 3.6  CL 103 107 105 110  CO2 19* 18* 20* 24  GLUCOSE 112* 132* 99 86  BUN 16 21* 24* 15  CREATININE 1.10 1.27* 1.29* 0.92  CALCIUM 9.9 9.7 9.1 8.9  MG  --  2.1 1.9 2.0  PHOS  --   --  3.5 2.7    Studies: No results found.  Scheduled Meds:  Chlorhexidine Gluconate Cloth  6 each Topical Daily   enoxaparin (LOVENOX) injection  40 mg Subcutaneous QPM   metoprolol tartrate  25 mg Oral BID   multivitamin with minerals  1 tablet Oral Daily   pantoprazole  40 mg Oral Daily   tamsulosin  0.4 mg Oral QPC supper   Continuous Infusions: PRN Meds: acetaminophen, ALPRAZolam, ondansetron **OR** ondansetron (ZOFRAN) IV  Time spent:40 minutes  Author: Gillis Santa. MD [facility] 12/22/2022 9:11 PM  To reach On-call, see care teams to locate the attending and reach out to them via [email or web address]. If 7PM-7AM, please contact night-coverage If you still have difficulty reaching the attending provider, please page  the [facility] (Director on Call) for [facility] on amion for assistance.

## 2022-12-22 NOTE — Discharge Summary (Signed)
[facility] Discharge Summary   Patient: [name] WUJ:[id number]  PCP: [name], PA  Date of admission: 12/20/2022   Date of discharge:  12/23/2022     Discharge Diagnoses:  Principal Problem:   Major depressive disorder, recurrent severe without psychotic features (HCC) Active Problems:   SVT (supraventricular tachycardia) (HCC)   Coronary artery disease of native artery of native heart with stable angina pectoris (HCC)   Cocaine use   Suicidal ideation   Typical atrial flutter (HCC)   Elevated troponin   Admitted From: Behavioral health unit Disposition: Behavioral health unit  Recommendations for Outpatient Follow-up:  PCP: in 1-2 wks F/u urologist in 1 to 2 weeks Continue psych management for depression and suicidal ideation. Follow up LABS/TEST:     Follow-up Information     [name], PA Follow up in 1 week(s).   Specialty: Family Medicine Contact information: [address] [place] [address] [phone]         Riki Altes, MD Follow up in 1 week(s).   Specialty: Urology Contact information: [address] [place] [address] [phone]                Diet recommendation: Regular diet  Activity: The patient is advised to gradually reintroduce usual activities, as tolerated  Discharge Condition: stable  Code Status: Full code   History of present illness: As per the H and P dictated on admission Hospital Course:  Alik Colon Magaw is a 62 y.o. male with medical history significant of major depressive failure with psychosis, hypertension, substance abuse presenting with SVT.  Received call from behavioral health unit at 7 AM about patient having SVT overnight.  Patient reports being woken up with chest pressure, palpitations.  Noted heart rate into the 160s.  No fevers or chills.  Noted urine drug screen positive for cocaine back in April of this year.  Patient denies any recent drug use.   No reported alcohol use.  Denies any prior episodes like this in the past.  No diagnosis of pericarditis back in May 2017.  As well as HFpEF in 2019.  Follows outpatient PCP.  No notes available in epic or Care Everywhere. Currently afebrile with heart rate in the 150s, blood pressure 130s over 70s.  Satting well on room air.  EKG with heart rate into the 150s showing SVT.  CBC and CMP grossly within normal limits.  Dr. Okey Dupre at the bedside.  Patient status post 6 mg then 12 mg of IV adenosine with persistence of heart rate into the 140s.  Some concern for underlying A-fib.  Started on amiodarone drip.   Assessment and Plan:   # Paroxysmal SVTs, converted back to normal sinus rhythm Heart rate 160--170, Troponin 3 likely elevated most likely demand ischemia. TSH 0.6, free T41.1 Within normal range D-dimer elevated, CTA negative for PE Lower extremity venous duplex negative for DVT S/p heparin IV infusion, discontinued as per cardiology S/p adenosine 6 mg followed by 12 mg given S/p amiodarone IV infusion Started Lopressor 25 mg p.o. twice daily TTE LVEF 65%, no wall motion overload.  Moderate LV hypertrophy. Patient was seen by cardiology, recommended no anticoagulation, continue metoprolol and patient is cleared to discharge to behavioral health. # Metabolic acidosis, resolved S/p oral bicarbonate 650 mg p.o. 3 times daily x 3 doses given # Hypokalemia, resolved Potassium 3.2 repleted cautiously elevated creatinine Potassium 3.6, creatinine 0.92 improved.  # Major depressive disorder, recurrent severe without psychotic  features: Management per psych team  # Suicidal ideation Management per psych team  # Cocaine use: UDS positive barbiturate, THC, tricyclic Drug abuse appearance counseling done   # Urinary retention, resolved Elevated creatinine could be due to urinary retention In-N-Out catheter was done 1 time, patient was again retaining urine and requested Foley catheter  insertion 12/11 Foley catheter was inserted, total urine output 800 mL Started Flomax 0.4 milligram p.o. nightly 12/12 Foley catheter was discontinued sooner because patient cannot go with Foley catheter to behavioral health unit due to suicidal thoughts and risk of strangulation. Patient did void 700 mL after Foley catheter removal.    Body mass index is 25.1 kg/m.  Nutrition Interventions:  - Patient was instructed, not to drive, operate heavy machinery, perform activities at heights, swimming or participation in water activities or provide baby sitting services while on Pain, Sleep and Anxiety Medications; until his outpatient Physician has advised to do so again.  - Also recommended to not to take more than prescribed Pain, Sleep and Anxiety Medications.  Patient was ambulatory without any assistance. On the day of the discharge the patient's vitals were stable, and no other acute medical condition were reported by patient. the patient was felt safe to be discharge at behavioral health unit.   Consultants: Cardiology Procedures: None  Discharge Exam: General: Appear in no distress, no Rash; Oral Mucosa Clear, moist. Cardiovascular: S1 and S2 Present, no Murmur, Respiratory: normal respiratory effort, Bilateral Air entry present and no Crackles, no wheezes Abdomen: Bowel Sound present, Soft and no tenderness, no hernia Extremities: no Pedal edema, no calf tenderness Neurology: alert and oriented to time, place, and person affect appropriate.  There were no vitals filed for this visit. Vitals:   12/23/22 0030 12/23/22 0432  BP: 128/89 133/86  Pulse: 79 80  Resp: (!) 21   Temp: 97.8 F (36.6 C) 97.9 F (36.6 C)  SpO2: 99% 99%    DISCHARGE MEDICATION: Allergies as of 12/23/2022   No Known Allergies      Medication List     TAKE these medications    metoprolol succinate 50 MG 24 hr tablet Commonly known as: TOPROL-XL Take 50 mg by mouth daily.   tamsulosin 0.4 MG  Caps capsule Commonly known as: FLOMAX Take 1 capsule (0.4 mg total) by mouth daily after supper.   traMADol 50 MG tablet Commonly known as: ULTRAM Take 50 mg by mouth every 6 (six) hours as needed.       No Known Allergies Discharge Instructions     Call MD for:   Complete by: As directed    Palpitations and urinary retention   Call MD for:  difficulty breathing, headache or visual disturbances   Complete by: As directed    Call MD for:  extreme fatigue   Complete by: As directed    Call MD for:  persistant dizziness or light-headedness   Complete by: As directed    Call MD for:  severe uncontrolled pain   Complete by: As directed    Call MD for:  temperature >100.4   Complete by: As directed    Diet - low sodium heart healthy   Complete by: As directed    Discharge instructions   Complete by: As directed    Follow-up with psychiatrist, patient is being transferred to psych unit Follow-up with urologist in 1 to 2 weeks for urinary retention   Increase activity slowly   Complete by: As directed  The results of significant diagnostics from this hospitalization (including imaging, microbiology, ancillary and laboratory) are listed below for reference.    Significant Diagnostic Studies: ECHOCARDIOGRAM COMPLETE Result Date: 12/21/2022    ECHOCARDIOGRAM REPORT   Patient Name:   [facility] Date of Exam: 12/21/2022 Medical Rec #:  [id number]         Height:       69.0 in Accession #:    [id number]        Weight:       170.0 lb Date of Birth:  01-31-1960        BSA:          1.928 m Patient Age:    62 years          BP:           104/67 mmHg Patient Gender: M                 HR:           66 bpm. Exam Location:  [facility] Procedure: 2D Echo, Cardiac Doppler and Color Doppler Indications:     Abnormal ECG  History:         Patient has prior history of Echocardiogram examinations, most                  recent 11/03/2017. Abnormal ECG, Arrythmias:Tachycardia,                   Signs/Symptoms:Murmur; Risk Factors:Hypertension. Substance                  abuse, Bi-Polar.  Sonographer:     Mikki Harbor Referring Phys:  [phone] Francoise Schaumann NE[place]ON Diagnosing Phys: Julien Nordmann MD IMPRESSIONS  1. Left ventricular ejection fraction, by estimation, is 60 to 65%. The left ventricle has normal function. The left ventricle has no regional wall motion abnormalities. There is moderate left ventricular hypertrophy. Left ventricular diastolic parameters were normal.  2. Right ventricular systolic function is normal. The right ventricular size is normal. There is normal pulmonary artery systolic pressure.  3. Left atrial size was mildly dilated.  4. The mitral valve is normal in structure. Mild to moderate mitral valve regurgitation. No evidence of mitral stenosis.  5. The aortic valve is normal in structure. There is mild calcification of the aortic valve. Aortic valve regurgitation is not visualized. Aortic valve sclerosis is present, with no evidence of aortic valve stenosis. Aortic valve mean gradient measures 3.0 mmHg.  6. The inferior vena cava is normal in size with greater than 50% respiratory variability, suggesting right atrial pressure of 3 mmHg. FINDINGS  Left Ventricle: Left ventricular ejection fraction, by estimation, is 60 to 65%. The left ventricle has normal function. The left ventricle has no regional wall motion abnormalities. The left ventricular internal cavity size was normal in size. There is  moderate left ventricular hypertrophy. Left ventricular diastolic parameters were normal. Right Ventricle: The right ventricular size is normal. No increase in right ventricular wall thickness. Right ventricular systolic function is normal. There is normal pulmonary artery systolic pressure. The tricuspid regurgitant velocity is 2.39 m/s, and  with an assumed right atrial pressure of 3 mmHg, the estimated right ventricular systolic pressure is 25.8 mmHg. Left Atrium: Left atrial size was  mildly dilated. Right Atrium: Right atrial size was normal in size. Pericardium: There is no evidence of pericardial effusion. Mitral Valve: The mitral valve is normal in structure. Mild to moderate mitral valve regurgitation. No  evidence of mitral valve stenosis. MV peak gradient, 2.7 mmHg. The mean mitral valve gradient is 1.0 mmHg. Tricuspid Valve: The tricuspid valve is normal in structure. Tricuspid valve regurgitation is mild . No evidence of tricuspid stenosis. Aortic Valve: The aortic valve is normal in structure. There is mild calcification of the aortic valve. Aortic valve regurgitation is not visualized. Aortic valve sclerosis is present, with no evidence of aortic valve stenosis. Aortic valve mean gradient  measures 3.0 mmHg. Aortic valve peak gradient measures 4.5 mmHg. Aortic valve area, by VTI measures 2.46 cm. Pulmonic Valve: The pulmonic valve was normal in structure. Pulmonic valve regurgitation is not visualized. No evidence of pulmonic stenosis. Aorta: The aortic root is normal in size and structure. Venous: The inferior vena cava is normal in size with greater than 50% respiratory variability, suggesting right atrial pressure of 3 mmHg. IAS/Shunts: No atrial level shunt detected by color flow Doppler.  LEFT VENTRICLE PLAX 2D LVIDd:         4.80 cm   Diastology LVIDs:         2.70 cm   LV e' medial:    9.03 cm/s LV PW:         1.00 cm   LV E/e' medial:  8.5 LV IVS:        1.40 cm   LV e' lateral:   10.70 cm/s LVOT diam:     2.10 cm   LV E/e' lateral: 7.1 LV SV:         65 LV SV Index:   34 LVOT Area:     3.46 cm  RIGHT VENTRICLE RV Basal diam:  3.70 cm RV Mid diam:    3.20 cm RV S prime:     13.50 cm/s LEFT ATRIUM             Index        RIGHT ATRIUM           Index LA diam:        4.50 cm 2.33 cm/m   RA Area:     20.00 cm LA Vol (A2C):   84.0 ml 43.57 ml/m  RA Volume:   56.10 ml  29.10 ml/m LA Vol (A4C):   70.1 ml 36.36 ml/m LA Biplane Vol: 82.9 ml 43.00 ml/m  AORTIC VALVE                     PULMONIC VALVE AV Area (Vmax):    2.88 cm     PV Vmax:       1.08 m/s AV Area (Vmean):   2.28 cm     PV Peak grad:  4.7 mmHg AV Area (VTI):     2.46 cm AV Vmax:           106.00 cm/s AV Vmean:          83.100 cm/s AV VTI:            0.265 m AV Peak Grad:      4.5 mmHg AV Mean Grad:      3.0 mmHg LVOT Vmax:         88.10 cm/s LVOT Vmean:        54.600 cm/s LVOT VTI:          0.188 m LVOT/AV VTI ratio: 0.71  AORTA Ao Root diam: 3.50 cm Ao Asc diam:  3.30 cm MITRAL VALVE               TRICUSPID  VALVE MV Area (PHT): 3.87 cm    TR Peak grad:   22.8 mmHg MV Area VTI:   3.46 cm    TR Vmax:        239.00 cm/s MV Peak grad:  2.7 mmHg MV Mean grad:  1.0 mmHg    SHUNTS MV Vmax:       0.82 m/s    Systemic VTI:  0.19 m MV Vmean:      31.2 cm/s   Systemic Diam: 2.10 cm MV Decel Time: 196 msec MV E velocity: 76.50 cm/s MV A velocity: 29.60 cm/s MV E/A ratio:  2.58 Julien Nordmann MD Electronically signed by Julien Nordmann MD Signature Date/Time: 12/21/2022/5:54:19 PM    Final    US Venous Img Lower Bilateral (DVT) Result Date: 12/21/2022 CLINICAL DATA:  220372 Positive D dimer [id number] EXAM: BILATERAL LOWER EXTREMITY VENOUS DOPPLER ULTRASOUND TECHNIQUE: Gray-scale sonography with graded compression, as well as color Doppler and duplex ultrasound were performed to evaluate the lower extremity deep venous systems from the level of the common femoral vein and including the common femoral, femoral, profunda femoral, popliteal and calf veins including the posterior tibial, peroneal and gastrocnemius veins when visible. The superficial great saphenous vein was also interrogated. Spectral Doppler was utilized to evaluate flow at rest and with distal augmentation maneuvers in the common femoral, femoral and popliteal veins. COMPARISON:  CTA chest, concurrent.  CT AP, 04/13/2022. FINDINGS: RIGHT LOWER EXTREMITY VENOUS Normal compressibility of the RIGHT common femoral, superficial femoral, and popliteal veins, as well as the  visualized calf veins. Visualized portions of profunda femoral vein and great saphenous vein unremarkable. No filling defects to suggest DVT on grayscale or color Doppler imaging. Doppler waveforms show normal direction of venous flow, normal respiratory plasticity and response to augmentation. OTHER No evidence of superficial thrombophlebitis or abnormal fluid collection. Limitations: none LEFT LOWER EXTREMITY VENOUS Normal compressibility of the LEFT common femoral, superficial femoral, and popliteal veins, as well as the visualized calf veins. Visualized portions of profunda femoral vein and great saphenous vein unremarkable. No filling defects to suggest DVT on grayscale or color Doppler imaging. Doppler waveforms show normal direction of venous flow, normal respiratory plasticity and response to augmentation. OTHER No evidence of superficial thrombophlebitis or abnormal fluid collection. Limitations: none IMPRESSION: No evidence of femoropopliteal DVT or superficial thrombophlebitis within either lower extremity. Roanna Banning, MD [facility] Electronically Signed   By: Roanna Banning M.D.   On: 12/21/2022 11:23   CT Angio Chest Pulmonary Embolism (PE) W or WO Contrast Result Date: 12/20/2022 CLINICAL DATA:  Concern for pulmonary embolism. EXAM: CT ANGIOGRAPHY CHEST WITH CONTRAST TECHNIQUE: Multidetector CT imaging of the chest was performed using the standard protocol during bolus administration of intravenous contrast. Multiplanar CT image reconstructions and MIPs were obtained to evaluate the vascular anatomy. RADIATION DOSE REDUCTION: This exam was performed according to the departmental dose-optimization program which includes automated exposure control, adjustment of the mA and/or kV according to patient size and/or use of iterative reconstruction technique. CONTRAST:  75mL OMNIPAQUE IOHEXOL 350 MG/ML SOLN COMPARISON:  Chest radiograph dated  12/20/2022 and CT dated 04/12/2020. FINDINGS: Cardiovascular: There is no cardiomegaly or pericardial effusion. There is 3 vessel coronary vascular calcification. Mild atherosclerotic calcification of the thoracic aorta. No aneurysmal dilatation. No pulmonary artery embolus identified. Mediastinum/Nodes: No hilar or mediastinal adenopathy. The esophagus and the thyroid gland are grossly unremarkable. No mediastinal fluid collection. Lungs/Pleura: No focal consolidation, pleural effusion, or pneumothorax. The  central airways are patent. Upper Abdomen: Small gallstones. Indeterminate partially visualized right renal partially exophytic lesion. MRI was previously recommended for better evaluation. Musculoskeletal: No acute osseous pathology. Review of the MIP images confirms the above findings. IMPRESSION: 1. No acute intrathoracic pathology. No CT evidence of pulmonary artery embolus. 2. Cholelithiasis. 3.  Aortic Atherosclerosis (ICD10-I70.0). Electronically Signed   By: Elgie Collard M.D.   On: 12/20/2022 17:02   DG Chest Port 1 View Result Date: 12/20/2022 CLINICAL DATA:  Supraventricular tachycardia. EXAM: PORTABLE CHEST 1 VIEW COMPARISON:  June 13, 2021. FINDINGS: The heart size and mediastinal contours are within normal limits. Stable scarring noted laterally in right midlung. No acute pulmonary disease is noted. The visualized skeletal structures are unremarkable. IMPRESSION: No active disease. Electronically Signed   By: Lupita Raider M.D.   On: 12/20/2022 10:21    Microbiology: No results found for this or any previous visit (from the past 240 hours).   Labs: CBC: Recent Labs  Lab 12/18/22 1434 12/20/22 0659 12/21/22 0120 12/22/22 0353 12/23/22 0417  WBC 8.2 9.8 6.3 4.4 4.7  HGB 16.4 16.3 14.3 13.0 13.6  HCT 48.1 46.6 41.5 38.0* 39.1  MCV 87.9 86.8 89.2 89.2 87.3  PLT 270 234 189 182 210   Basic Metabolic Panel: Recent Labs  Lab 12/18/22 1434 12/20/22 0659 12/21/22 0120  12/22/22 0353 12/23/22 0417  NA 137 137 134* 139 136  K 3.9 3.5 3.2* 3.6 3.7  CL 103 107 105 110 105  CO2 19* 18* 20* 24 23  GLUCOSE 112* 132* 99 86 104*  BUN 16 21* 24* 15 14  CREATININE 1.10 1.27* 1.29* 0.92 1.06  CALCIUM 9.9 9.7 9.1 8.9 9.2  MG  --  2.1 1.9 2.0 2.1  PHOS  --   --  3.5 2.7 2.9   Liver Function Tests: Recent Labs  Lab 12/18/22 1434 12/21/22 0120 12/22/22 0353  AST 22 16 12*  ALT 20 16 11   ALKPHOS 79 58 55  BILITOT 1.2* 0.8 0.6  PROT 8.4* 6.3* 6.0*  ALBUMIN 4.7 3.4* 3.2*   No results for input(s): "LIPASE", "AMYLASE" in the last 168 hours. No results for input(s): "AMMONIA" in the last 168 hours. Cardiac Enzymes: No results for input(s): "CKTOTAL", "CKMB", "CKMBINDEX", "TROPONINI" in the last 168 hours. BNP (last 3 results) No results for input(s): "BNP" in the last 8760 hours. CBG: Recent Labs  Lab 12/20/22 0640  GLUCAP 151*    Time spent: 35 minutes  Signed:  Gillis Santa  [facility] 12/23/2022 11:19 AM

## 2022-12-22 NOTE — Progress Notes (Signed)
   Patient Name: [name] Date of Encounter: 12/22/2022 [facility] Cardiologist: Yvonne Kendall, MD   Interval Summary  .    Reports feeling well, no complaints Denies chest pain or shortness of breath Telemetry reviewed, no tachycardia or other arrhythmia noted  Vital Signs .    Vitals:   12/21/22 2211 12/21/22 2308 12/22/22 0316 12/22/22 0800  BP: 115/85 105/78 117/81   Pulse: 80  70   Resp:   14   Temp:  98.4 F (36.9 C) 98.6 F (37 C) 97.9 F (36.6 C)  TempSrc:  Oral Oral Oral  SpO2:   97%   Height:        Intake/Output Summary (Last 24 hours) at 12/22/2022 1409 Last data filed at 12/22/2022 0555 Gross per 24 hour  Intake --  Output 1250 ml  Net -1250 ml      12/18/2022    8:22 PM 12/18/2022    2:31 PM 09/15/2022   10:00 PM  Last 3 Weights  Weight (lbs)  180 lb   Weight (kg)  81.647 kg      Information is confidential and restricted. Go to Review Flowsheets to unlock data.      Telemetry/ECG    Normal sinus rhythm- Personally Reviewed  Physical Exam .   Constitutional:  oriented to person, place, and time. No distress.  HENT:  Head: Grossly normal Eyes:  no discharge. No scleral icterus.  Neck: No JVD, no carotid bruits  Cardiovascular: Regular rate and rhythm, no murmurs appreciated Pulmonary/Chest: Clear to auscultation bilaterally, no wheezes or rails Abdominal: Soft.  no distension.  no tenderness.  Musculoskeletal: Normal range of motion Neurological:  normal muscle tone. Coordination normal. No atrophy full exam not performed Skin: Skin warm and dry Psychiatric: normal affect, pleasant   Assessment & Plan .     Atrial flutter/fib Rapid response called December 10 at 8:30 AM Acute tachycardia waking him from sleep rates in the 150 up to 160 range Given adenosine 6, 12, did not convert but did show flutter Started on amiodarone bolus and infusion, converting to normal sinus rhythm yesterday -Amiodarone has since been  discontinued -Continued on metoprolol tartrate 25 twice daily, dosing somewhat limited secondary to borderline low blood pressure Given CHA2DS2-VASc 1, will hold off on anticoagulation, normal ejection fraction  Elevated troponin Likely supply/demand mismatch in the setting of tachycardia Denies anginal symptoms Ejection fraction normal -On review of chest CT scan, there is significant coronary calcification multiple vessels  -He declined stress testing, Myoview -As an outpatient we can help arrange cardiac CTA  Elevated D-dimer,  Value of 17 etiology unclear, CTA chest no PE Consider lower extremity venous Doppler Heparin infusion held, now on Lovenox for DVT prophylaxis  Essential hypertension Continue metoprolol tartrate 25 twice daily Blood pressure and heart rate stable  Major depressive disorder/suicidal ideation Per psychiatry    For questions or updates, please contact [facility] Please consult [email or web address] for contact info under        Signed, Julien Nordmann, MD

## 2022-12-22 NOTE — Plan of Care (Signed)

## 2022-12-22 NOTE — Progress Notes (Signed)
Patient voided 700 ml post foley removal. BHU nurse notified. Pending BHU acceptance to transfer patient. BHU nurse to call back when bed offer is available.

## 2022-12-23 ENCOUNTER — Inpatient Hospital Stay
Admission: AD | Admit: 2022-12-23 | Discharge: 2022-12-30 | DRG: 885 | Disposition: A | Discharge status: Home / Self Care | Payer: MEDICAID | Source: Intra-hospital | Attending: Psychiatry | Admitting: Psychiatry

## 2022-12-23 ENCOUNTER — Other Ambulatory Visit: Payer: Self-pay

## 2022-12-23 ENCOUNTER — Encounter: Payer: Self-pay | Admitting: Psychiatry

## 2022-12-23 DIAGNOSIS — E876 Hypokalemia: Secondary | ICD-10-CM | POA: Diagnosis not present

## 2022-12-23 DIAGNOSIS — I483 Typical atrial flutter: Secondary | ICD-10-CM | POA: Diagnosis not present

## 2022-12-23 DIAGNOSIS — R339 Retention of urine, unspecified: Secondary | ICD-10-CM

## 2022-12-23 DIAGNOSIS — Z5982 Transportation insecurity: Secondary | ICD-10-CM

## 2022-12-23 DIAGNOSIS — Z5941 Food insecurity: Secondary | ICD-10-CM | POA: Diagnosis not present

## 2022-12-23 DIAGNOSIS — K219 Gastro-esophageal reflux disease without esophagitis: Secondary | ICD-10-CM | POA: Diagnosis present

## 2022-12-23 DIAGNOSIS — Z87891 Personal history of nicotine dependence: Secondary | ICD-10-CM

## 2022-12-23 DIAGNOSIS — I4892 Unspecified atrial flutter: Secondary | ICD-10-CM | POA: Diagnosis present

## 2022-12-23 DIAGNOSIS — F1210 Cannabis abuse, uncomplicated: Secondary | ICD-10-CM

## 2022-12-23 DIAGNOSIS — Z818 Family history of other mental and behavioral disorders: Secondary | ICD-10-CM | POA: Diagnosis not present

## 2022-12-23 DIAGNOSIS — F332 Major depressive disorder, recurrent severe without psychotic features: Principal | ICD-10-CM | POA: Diagnosis present

## 2022-12-23 DIAGNOSIS — E8721 Acute metabolic acidosis: Secondary | ICD-10-CM | POA: Diagnosis not present

## 2022-12-23 DIAGNOSIS — Z79899 Other long term (current) drug therapy: Secondary | ICD-10-CM

## 2022-12-23 DIAGNOSIS — F411 Generalized anxiety disorder: Secondary | ICD-10-CM | POA: Diagnosis present

## 2022-12-23 DIAGNOSIS — G4700 Insomnia, unspecified: Secondary | ICD-10-CM | POA: Diagnosis present

## 2022-12-23 DIAGNOSIS — F315 Bipolar disorder, current episode depressed, severe, with psychotic features: Secondary | ICD-10-CM | POA: Diagnosis present

## 2022-12-23 DIAGNOSIS — I471 Supraventricular tachycardia, unspecified: Secondary | ICD-10-CM | POA: Diagnosis not present

## 2022-12-23 DIAGNOSIS — Z96653 Presence of artificial knee joint, bilateral: Secondary | ICD-10-CM | POA: Diagnosis present

## 2022-12-23 DIAGNOSIS — Z823 Family history of stroke: Secondary | ICD-10-CM

## 2022-12-23 DIAGNOSIS — I472 Ventricular tachycardia, unspecified: Secondary | ICD-10-CM | POA: Diagnosis present

## 2022-12-23 DIAGNOSIS — I10 Essential (primary) hypertension: Secondary | ICD-10-CM | POA: Diagnosis present

## 2022-12-23 DIAGNOSIS — R45851 Suicidal ideations: Secondary | ICD-10-CM | POA: Diagnosis present

## 2022-12-23 LAB — BASIC METABOLIC PANEL
Anion gap: 8 (ref 5–15)
BUN: 14 mg/dL (ref 6–20)
CO2: 23 mmol/L (ref 22–32)
Calcium: 9.2 mg/dL (ref 8.9–10.3)
Chloride: 105 mmol/L (ref 98–111)
Creatinine, Ser: 1.06 mg/dL (ref 0.61–1.24)
GFR, Estimated: >60 mL/min (ref >60)
Glucose, Bld: 104 mg/dL — ABNORMAL HIGH (ref 70–99)
Potassium: 3.7 mmol/L (ref 3.5–5.1)
Sodium: 136 mmol/L (ref 135–145)

## 2022-12-23 LAB — CBC
HCT: 39.1 % (ref 39.0–52.0)
Hemoglobin: 13.6 g/dL (ref 13.0–17.0)
MCH: 30.4 pg (ref 26.0–34.0)
MCHC: 34.8 g/dL (ref 30.0–36.0)
MCV: 87.3 fL (ref 80.0–100.0)
Platelets: 210 10*3/uL (ref 150–400)
RBC: 4.48 MIL/uL (ref 4.22–5.81)
RDW: 13.2 % (ref 11.5–15.5)
WBC: 4.7 10*3/uL (ref 4.0–10.5)
nRBC: 0 % (ref 0.0–0.2)

## 2022-12-23 LAB — PHOSPHORUS: Phosphorus: 2.9 mg/dL (ref 2.5–4.6)

## 2022-12-23 LAB — MAGNESIUM: Magnesium: 2.1 mg/dL (ref 1.7–2.4)

## 2022-12-23 MED ORDER — ALUM & MAG HYDROXIDE-SIMETH 200-200-20 MG/5ML PO SUSP: 30.0000 mL | ORAL | Status: DC | PRN

## 2022-12-23 MED ORDER — MAGNESIUM HYDROXIDE 400 MG/5ML PO SUSP: 30.0000 mL | Freq: Every day | ORAL | Status: DC | PRN

## 2022-12-23 MED ORDER — HALOPERIDOL 5 MG PO TABS
5.0000 mg | ORAL_TABLET | Freq: Three times a day (TID) | ORAL | Status: DC | PRN
  Administered 2022-12-24 – 2022-12-28 (×5): 5 mg via ORAL
  Filled 2022-12-23 (×5): qty 1

## 2022-12-23 MED ORDER — DIPHENHYDRAMINE HCL 25 MG PO CAPS
50.0000 mg | ORAL_CAPSULE | Freq: Three times a day (TID) | ORAL | Status: DC | PRN
  Administered 2022-12-24 – 2022-12-28 (×6): 50 mg via ORAL
  Filled 2022-12-23 (×7): qty 2

## 2022-12-23 MED ORDER — ACETAMINOPHEN 325 MG PO TABS
650.0000 mg | ORAL_TABLET | Freq: Four times a day (QID) | ORAL | Status: DC | PRN
  Administered 2022-12-24 – 2022-12-28 (×8): 650 mg via ORAL
  Filled 2022-12-23 (×9): qty 2

## 2022-12-23 NOTE — Tx Team (Signed)
Initial Treatment Plan 12/23/2022 5:05 PM Victor Fearing Colon Mctiernan [id number]    PATIENT STRESSORS: Financial difficulties   Health problems   Medication change or noncompliance     PATIENT STRENGTHS: Ability for insight  Communication skills  General fund of knowledge  Motivation for treatment/growth    PATIENT IDENTIFIED PROBLEMS: Depression  Anxiety  Health issues/concerns  Medication needs changing               DISCHARGE CRITERIA:  Ability to meet basic life and health needs Improved stabilization in mood, thinking, and/or behavior Need for constant or close observation no longer present Reduction of life-threatening or endangering symptoms to within safe limits  PRELIMINARY DISCHARGE PLAN: Outpatient therapy Return to previous living arrangement  PATIENT/FAMILY INVOLVEMENT: This treatment plan has been presented to and reviewed with the patient, [name]. The patient has been given the opportunity to ask questions and make suggestions.  Charlottie Peragine, RN 12/23/2022, 5:05 PM

## 2022-12-23 NOTE — Group Note (Signed)
Date:  12/23/2022 Time:  8:56 PM  Group Topic/Focus:  Wrap-Up Group:   The focus of this group is to help patients review their daily goal of treatment and discuss progress on daily workbooks.    Participation Level:  Did Not Attend   Katina Dung 12/23/2022, 8:56 PM

## 2022-12-23 NOTE — Progress Notes (Signed)
   12/23/22 1600  Psych Admission Type (Psych Patients Only)  Admission Status Voluntary  Psychosocial Assessment  Patient Complaints Anxiety;Depression;Other (Comment) (hallucinations)  Eye Contact Fair  Facial Expression Anxious;Worried  Affect Anxious  Speech Logical/coherent  Interaction Assertive  Motor Activity Slow  Appearance/Hygiene In scrubs;Unremarkable  Behavior Characteristics Cooperative;Appropriate to situation  Mood Anxious;Pleasant  Aggressive Behavior  Effect No apparent injury  Thought Process  Coherency WDL  Content WDL  Delusions None reported or observed  Perception Hallucinations  Hallucination Auditory  Judgment WDL  Confusion None  Danger to Self  Current suicidal ideation? Denies  Agreement Not to Harm Self Yes  Description of Agreement Verbal  Danger to Others  Danger to Others None reported or observed

## 2022-12-23 NOTE — Plan of Care (Signed)

## 2022-12-23 NOTE — Plan of Care (Signed)
New admission.  Problem: Education: Goal: Knowledge of [facility] Education information/materials will improve Outcome: Not Progressing Goal: Emotional status will improve Outcome: Not Progressing Goal: Mental status will improve Outcome: Not Progressing Goal: Verbalization of understanding the information provided will improve Outcome: Not Progressing   Problem: Activity: Goal: Interest or engagement in activities will improve Outcome: Not Progressing Goal: Sleeping patterns will improve Outcome: Not Progressing   Problem: Coping: Goal: Ability to verbalize frustrations and anger appropriately will improve Outcome: Not Progressing Goal: Ability to demonstrate self-control will improve Outcome: Not Progressing   Problem: Health Behavior/Discharge Planning: Goal: Identification of resources available to assist in meeting health care needs will improve Outcome: Not Progressing Goal: Compliance with treatment plan for underlying cause of condition will improve Outcome: Not Progressing   Problem: Physical Regulation: Goal: Ability to maintain clinical measurements within normal limits will improve Outcome: Not Progressing   Problem: Safety: Goal: Periods of time without injury will increase Outcome: Not Progressing   Problem: Education: Goal: Knowledge of General Education information will improve Description: Including pain rating scale, medication(s)/side effects and non-pharmacologic comfort measures Outcome: Not Progressing   Problem: Health Behavior/Discharge Planning: Goal: Ability to manage health-related needs will improve Outcome: Not Progressing   Problem: Clinical Measurements: Goal: Ability to maintain clinical measurements within normal limits will improve Outcome: Not Progressing Goal: Will remain free from infection Outcome: Not Progressing Goal: Diagnostic test results will improve Outcome: Not Progressing Goal: Respiratory complications will  improve Outcome: Not Progressing Goal: Cardiovascular complication will be avoided Outcome: Not Progressing   Problem: Activity: Goal: Risk for activity intolerance will decrease Outcome: Not Progressing   Problem: Nutrition: Goal: Adequate nutrition will be maintained Outcome: Not Progressing   Problem: Coping: Goal: Level of anxiety will decrease Outcome: Not Progressing   Problem: Elimination: Goal: Will not experience complications related to bowel motility Outcome: Not Progressing Goal: Will not experience complications related to urinary retention Outcome: Not Progressing   Problem: Pain Management: Goal: General experience of comfort will improve Outcome: Not Progressing   Problem: Safety: Goal: Ability to remain free from injury will improve Outcome: Not Progressing   Problem: Skin Integrity: Goal: Risk for impaired skin integrity will decrease Outcome: Not Progressing   Problem: Coping: Goal: Coping ability will improve Outcome: Not Progressing Goal: Will verbalize feelings Outcome: Not Progressing   Problem: Education: Goal: Will be free of psychotic symptoms Outcome: Not Progressing Goal: Knowledge of the prescribed therapeutic regimen will improve Outcome: Not Progressing   Problem: Self-Concept: Goal: Ability to identify factors that promote anxiety will improve Outcome: Not Progressing Goal: Level of anxiety will decrease Outcome: Not Progressing Goal: Ability to modify response to factors that promote anxiety will improve Outcome: Not Progressing

## 2022-12-23 NOTE — Group Note (Signed)
Date:  12/23/2022 Time:  5:45 PM  Group Topic/Focus:  Wellness Toolbox:   The focus of this group is to discuss various aspects of wellness, balancing those aspects and exploring ways to increase the ability to experience wellness.  Patients will create a wellness toolbox for use upon discharge.    Participation Level:  Did Not Attend   Lynelle Smoke [facility] 12/23/2022, 5:45 PM

## 2022-12-24 DIAGNOSIS — I483 Typical atrial flutter: Secondary | ICD-10-CM

## 2022-12-24 DIAGNOSIS — F1210 Cannabis abuse, uncomplicated: Secondary | ICD-10-CM | POA: Diagnosis not present

## 2022-12-24 DIAGNOSIS — I10 Essential (primary) hypertension: Secondary | ICD-10-CM

## 2022-12-24 DIAGNOSIS — F315 Bipolar disorder, current episode depressed, severe, with psychotic features: Principal | ICD-10-CM

## 2022-12-24 MED ORDER — MELATONIN 5 MG PO TABS
5.0000 mg | ORAL_TABLET | Freq: Every day | ORAL | Status: DC
  Administered 2022-12-24 – 2022-12-26 (×2): 5 mg via ORAL
  Filled 2022-12-24 (×3): qty 1

## 2022-12-24 MED ORDER — BUSPIRONE HCL 5 MG PO TABS
5.0000 mg | ORAL_TABLET | Freq: Two times a day (BID) | ORAL | Status: DC
  Administered 2022-12-24 – 2022-12-25 (×2): 5 mg via ORAL
  Filled 2022-12-24 (×2): qty 1

## 2022-12-24 MED ORDER — METOPROLOL SUCCINATE ER 25 MG PO TB24
50.0000 mg | ORAL_TABLET | Freq: Every day | ORAL | Status: DC
  Administered 2022-12-24 – 2022-12-30 (×7): 50 mg via ORAL
  Filled 2022-12-24 (×7): qty 2

## 2022-12-24 MED ORDER — LURASIDONE HCL 40 MG PO TABS
20.0000 mg | ORAL_TABLET | Freq: Every day | ORAL | Status: DC
  Administered 2022-12-24 – 2022-12-25 (×2): 20 mg via ORAL
  Filled 2022-12-24 (×2): qty 1

## 2022-12-24 MED ORDER — DIVALPROEX SODIUM 500 MG PO DR TAB
500.0000 mg | DELAYED_RELEASE_TABLET | Freq: Two times a day (BID) | ORAL | Status: DC
  Administered 2022-12-24 – 2022-12-30 (×12): 500 mg via ORAL
  Filled 2022-12-24 (×12): qty 1

## 2022-12-24 MED ORDER — HYDRALAZINE HCL 25 MG PO TABS: 25.0000 mg | ORAL_TABLET | Freq: Four times a day (QID) | ORAL | Status: DC | PRN

## 2022-12-24 NOTE — H&P (Signed)
Psychiatric Admission Assessment Adult  Patient Identification: [name] MRN:  [id number] Date of Evaluation:  12/24/2022 Chief Complaint:  MDD (major depressive disorder), recurrent episode, severe (HCC) [F33.2] Principal Diagnosis: MDD (major depressive disorder), recurrent episode, severe (HCC) Diagnosis:  Principal Problem:   MDD (major depressive disorder), recurrent episode, severe (HCC)  History of Present Illness: Patient is a 62 y/o male who was transferred transferred from inpatient psychiatry to medical floor for concerns of SVT on 12/10 /2024. He had initially been admitted to inpatient psychiatry because he had been "out of meds". Also reports experiencing suicidal ideations at the time with plans of hanging himself and had been hearing command auditory hallucinations to kill himself.  Patient was transferred back to psych unit yesterday after medical clearance.   Chart reviewed, case discussed in multidisciplinary meeting, patient seen during rounds.  Patient continues to endorse depressed mood, anhedonia, poor sleep.  Patient said that he is depressed because his girlfriend is a nursing home, he also is on probation.  He did not want to discuss his charges in detail.  At times patient feels helpless and hopeless.  Patient was provided with support and reassurance.  Patient reports he sometimes hear voices telling him to kill himself.  We discussed different antipsychotics, patient agrees to try Jordan.  Patient was informed that Jordan does not prolong QTc interval.  Patient denies manic symptoms at present but have experienced mania in the past.  Patient was encouraged to attend group and work on coping strategies.  Patient was informed that hospitalist has been consulted to follow-up on medical conditions including hypertension.  Past Psychiatric History: Patient has history of bipolar disorder and substance abuse  Is the patient at risk to self? Yes.    Has the patient  been a risk to self in the past 6 months? Yes.    Has the patient been a risk to self within the distant past? Yes.    Is the patient a risk to others? No.  Has the patient been a risk to others in the past 6 months? No.  Has the patient been a risk to others within the distant past? No.   [place] Scale:  Flowsheet Row Admission (Current) from 12/23/2022 in [facility] INPATIENT BEHAV[facility]AL MEDICINE Admission (Discharged) from 12/20/2022 in [facility] REGIONAL CARDIAC MED PCU Admission (Discharged) from 12/18/2022 in [facility] INPATIENT BEHAVIORAL MEDICINE  C-SSRS RISK CATEGORY High Risk High Risk High Risk        Prior Inpatient Therapy: Yes.    Prior Outpatient Therapy: Yes.     Alcohol Screening: 1. How often do you have a drink containing alcohol?: Never 2. How many drinks containing alcohol do you have on a typical day when you are drinking?: 1 or 2 3. How often do you have six or more drinks on one occasion?: Never AUDIT-C Score: 0 4. How often during the last year have you found that you were not able to stop drinking once you had started?: Never 5. How often during the last year have you failed to do what was normally expected from you because of drinking?: Never 6. How often during the last year have you needed a first drink in the morning to get yourself going after a heavy drinking session?: Never 7. How often during the last year have you had a feeling of guilt of remorse after drinking?: Never 8. How often during the last year have you been unable to remember what happened the night before because you had  been drinking?: Never 9. Have you or someone else been injured as a result of your drinking?: No 10. Has a relative or friend or a doctor or another health worker been concerned about your drinking or suggested you cut down?: No Alcohol Use Disorder Identification Test Final Score (AUDIT): 0 Substance Abuse History in the last 12 months:  Yes.    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 echo 5/17: EF 60-65%, no RWMA, LV dias fxn nl, LA mildly dilated, RV sys fxn nl, PASP nl, moderate sized circumferential pericardial effusion was identified, 2.12 cm around the LV free wall, <1 cm around the RV free wall. Features were not c/w tamponade physiology   PTSD (post-traumatic stress disorder)    Witnessed brother's suicide.   Restless leg syndrome    Seizures (HCC)    Syncope     Past Surgical History:  Procedure Laterality Date   CYSTOSCOPY WITH URETEROSCOPY AND STENT PLACEMENT     ESOPHAGOGASTRODUODENOSCOPY N/A 01/11/2016   Procedure: ESOPHAGOGASTRODUODENOSCOPY (EGD);  Surgeon: Charlott Rakes, MD;  Location: [facility] ENDOSCOPY;  Service: Endoscopy;  Laterality: N/A;   ESOPHAGOGASTRODUODENOSCOPY N/A 04/09/2020   Procedure: ESOPHAGOGASTRODUODENOSCOPY (EGD);  Surgeon: [name], MD;  Location: [facility] ENDOSCOPY;  Service: Gastroenterology;  Laterality: N/A;   INCISION AND DRAINAGE ABSCESS Left 01/02/2018   Procedure: INCISION AND DRAINAGE LEFT KNEE;  Surgeon: Deeann Saint, MD;  Location: [facility] ORS;  Service: Orthopedics;  Laterality: Left;   JOINT REPLACEMENT Right    TKR   KNEE ARTHROSCOPY Right 06/25/2014   Procedure: ARTHROSCOPY KNEE;  Surgeon: Deeann Saint, MD;  Location: [facility] ORS;  Service: Orthopedics;  Laterality: Right;  partial arthroscopic medial menisectomy   LAPAROSCOPIC APPENDECTOMY N/A 06/02/2021   Procedure: APPENDECTOMY LAPAROSCOPIC;  Surgeon: Campbell Lerner, MD;  Location: [facility] ORS;  Service: General;  Laterality: N/A;   TOTAL KNEE ARTHROPLASTY Right  04/22/2015   Procedure: TOTAL KNEE ARTHROPLASTY;  Surgeon: Deeann Saint, MD;  Location: [facility] ORS;  Service: Orthopedics;  Laterality: Right;   TOTAL KNEE ARTHROPLASTY Left 10/30/2017   Procedure: TOTAL KNEE ARTHROPLASTY;  Surgeon: Deeann Saint, MD;  Location: [facility] ORS;  Service: Orthopedics;  Laterality: Left;   TOTAL KNEE REVISION Left 01/02/2018   Procedure: poly exchange of tibia and patella left knee;  Surgeon: Deeann Saint, MD;  Location: [facility] ORS;  Service: Orthopedics;  Laterality: Left;   UMBILICAL HERNIA REPAIR  06/02/2021   Procedure: HERNIA REPAIR UMBILICAL ADULT;  Surgeon: Campbell Lerner, MD;  Location: [facility] ORS;  Service: General;;   Family History:  Family History  Problem Relation Age of Onset   CVA Mother        deceased at age 3   Depression Brother        Died by suicide at age 21    Tobacco Screening:  Social History   Tobacco Use  Smoking Status Former   Current packs/day: 0.00   Average packs/day: 0.8 packs/day for 20.0 years (15.0 ttl pk-yrs)   Types: Cigarettes  Start date: 05/16/1964   Quit date: 05/16/1984   Years since quitting: 38.6  Smokeless Tobacco Never    BH Tobacco Counseling     Are you interested in Tobacco Cessation Medications?  N/A, patient does not use tobacco products Counseled patient on smoking cessation:  N/A, patient does not use tobacco products Reason Tobacco Screening Not Completed: Patient Refused Screening       Social History:  Social History   Substance and Sexual Activity  Alcohol Use Not Currently   Comment: rare     Social History   Substance and Sexual Activity  Drug Use Yes   Types: Marijuana, Cocaine   Comment: has not used recently    Additional Social History:  Patient reports he rents a room in a house                         Allergies:  No Known Allergies Lab Results:  Results for orders placed or performed during the hospital encounter of 12/20/22 (from the past 48 hours)  Basic  metabolic panel     Status: Abnormal   Collection Time: 12/23/22  4:17 AM  Result Value Ref Range   Sodium 136 135 - 145 mmol/L   Potassium 3.7 3.5 - 5.1 mmol/L   Chloride 105 98 - 111 mmol/L   CO2 23 22 - 32 mmol/L   Glucose, Bld 104 (H) 70 - 99 mg/dL    Comment: Glucose reference range applies only to samples taken after fasting for at least 8 hours.   BUN 14 6 - 20 mg/dL   Creatinine, Ser 4.09 0.61 - 1.24 mg/dL   Calcium 9.2 8.9 - 81.1 mg/dL   GFR, Estimated >91 >47 mL/min    Comment: (NOTE) Calculated using the CKD-EPI Creatinine Equation (2021)    Anion gap 8 5 - 15    Comment: Performed at [facility], [address] [place], [place] [address]  CBC     Status: None   Collection Time: 12/23/22  4:17 AM  Result Value Ref Range   WBC 4.7 4.0 - 10.5 K/uL   RBC 4.48 4.22 - 5.81 MIL/uL   Hemoglobin 13.6 13.0 - 17.0 g/dL   HCT 21.3 08.6 - 57.8 %   MCV 87.3 80.0 - 100.0 fL   MCH 30.4 26.0 - 34.0 pg   MCHC 34.8 30.0 - 36.0 g/dL   RDW 46.9 62.9 - 52.8 %   Platelets 210 150 - 400 K/uL   nRBC 0.0 0.0 - 0.2 %    Comment: Performed at [facility], [address] [place], [place] [address]  Magnesium     Status: None   Collection Time: 12/23/22  4:17 AM  Result Value Ref Range   Magnesium 2.1 1.7 - 2.4 mg/dL    Comment: Performed at [facility], [address] [place], [place] [address]  Phosphorus     Status: None   Collection Time: 12/23/22  4:17 AM  Result Value Ref Range   Phosphorus 2.9 2.5 - 4.6 mg/dL    Comment: Performed at [facility], [address] [place], [place] [address]    Blood Alcohol level:  Lab Results  Component Value Date   [facility] <10 12/18/2022   ETH <10 09/15/2022    Metabolic Disorder Labs:  Lab Results  Component Value Date   HGBA1C 5.5 04/16/2022   MPG 111 04/16/2022   MPG 105.41 01/18/2021   No results found  for: "PROLACTIN" Lab Results  Component Value Date   CHOL 184 04/16/2022   TRIG 133  04/16/2022   HDL 32 (L) 04/16/2022   CHOLHDL 5.8 04/16/2022   VLDL 27 04/16/2022   LDLCALC 125 (H) 04/16/2022   LDLCALC 128 (H) 01/18/2021    Current Medications: Current Facility-Administered Medications  Medication Dose Route Frequency Provider Last Rate Last Admin   acetaminophen (TYLENOL) tablet 650 mg  650 mg Oral Q6H PRN Starkes-Perry, Juel Burrow, FNP       alum & mag hydroxide-simeth (MAALOX/MYLANTA) 200-200-20 MG/5ML suspension 30 mL  30 mL Oral Q4H PRN Starkes-Perry, Juel Burrow, FNP       haloperidol (HALDOL) tablet 5 mg  5 mg Oral TID PRN Maryagnes Amos, FNP       And   diphenhydrAMINE (BENADRYL) capsule 50 mg  50 mg Oral TID PRN Starkes-Perry, Juel Burrow, FNP       magnesium hydroxide (MILK OF MAGNESIA) suspension 30 mL  30 mL Oral Daily PRN Maryagnes Amos, FNP       metoprolol succinate (TOPROL-XL) 24 hr tablet 50 mg  50 mg Oral Daily Esaw Grandchild A, DO       PTA Medications: Medications Prior to Admission  Medication Sig Dispense Refill Last Dose/Taking   metoprolol succinate (TOPROL-XL) 50 MG 24 hr tablet Take 50 mg by mouth daily.      tamsulosin (FLOMAX) 0.4 MG CAPS capsule Take 1 capsule (0.4 mg total) by mouth daily after supper.      traMADol (ULTRAM) 50 MG tablet Take 50 mg by mouth every 6 (six) hours as needed.          Physical Exam: Musculoskeletal: Strength & Muscle Tone: within normal limits Gait & Station: normal Patient leans: N/A   Psychiatric Specialty Exam:   Presentation  General Appearance:  Appropriate for Environment   Eye Contact: Fair   Speech: Clear and Coherent; Slow   Speech Volume: Decreased   Handedness: Right     Mood and Affect  Mood: Anxious; Depressed   Affect: Flat     Thought Process  Thought Processes: Coherent; Goal Directed; Linear   Descriptions of Associations:Intact   Orientation:Full (Time, Place and Person)   Thought Content: Positive for suicidal ideations   History of  Schizophrenia/Schizoaffective disorder:No   Duration of Psychotic Symptoms:Greater than six months   Hallucinations:AH Ideas of Reference:None   Suicidal Thoughts:Yes Homicidal Thoughts:No   Sensorium  Memory: Immediate Fair   Judgment: Limited   Insight: Fair     Chartered certified accountant: Fair   Attention Span: Fair   Recall: [employer] of Knowledge: Fair   Language: Fair     Psychomotor Activity  Psychomotor Activity:No data recorded   Assets  Assets: Communication Skills; Housing     Sleep  Sleep:Fair     Physical Exam: Physical Exam Constitutional:      Appearance: Normal appearance.  HENT:     Head: Normocephalic and atraumatic.     Nose: No congestion.  Eyes:     Pupils: Pupils are equal, round, and reactive to light.  Pulmonary:     Effort: Pulmonary effort is normal.  Skin:    General: Skin is warm.  Neurological:     General: No focal deficit present.     Mental Status: He is alert and oriented to person, place, and time.      Review of Systems  Constitutional:  Negative for chills and fever.  HENT:  Negative for hearing loss and sore throat.   Eyes:  Negative for blurred vision and double vision.  Respiratory:  Negative for cough and shortness of breath.   Cardiovascular:  Negative for chest pain and palpitations.  Gastrointestinal:  Negative for nausea and vomiting.  Psychiatric/Behavioral:  Positive for depression, hallucinations and suicidal ideas. The patient is nervous/anxious.    Blood pressure (!) 153/112, pulse 80, temperature 97.7 F (36.5 C), temperature source Oral, resp. rate 19, height 5\' 9"  (1.753 m), weight 81.2 kg, SpO2 97%. Body mass index is 26.43 kg/m.  Treatment Plan Summary: Daily contact with patient to assess and evaluate symptoms and progress in treatment and Medication management  Observation Level/Precautions:  15 minute checks  Laboratory:  CBC Chemistry Profile UDS  Psychotherapy:     Medications:    Consultations:    Discharge Concerns:    Estimated LOS: 5-7 days  Other:     Physician Treatment Plan for Primary Diagnosis: MDD (major depressive disorder), recurrent episode, severe (HCC) Long Term Goal(s): Improvement in symptoms so as ready for discharge  Short Term Goals: Ability to identify changes in lifestyle to reduce recurrence of condition will improve, Ability to verbalize feelings will improve, Ability to disclose and discuss suicidal ideas, Ability to demonstrate self-control will improve, Ability to identify and develop effective coping behaviors will improve, Ability to maintain clinical measurements within normal limits will improve, Compliance with prescribed medications will improve, and Ability to identify triggers associated with substance abuse/mental health issues will improve 1.  Patient is admitted to locked unit precautions 2.  Will consult hospitalist team to manage medical conditions including hypertension 3.  Will start  -Depakote 500 mg by mouth twice daily for mood stabilization -Latuda 20 mg daily. To target psychotic symptoms -Melatonin 5 mg at bedtime for insomnia -BuSpar 5 mg by mouth twice daily for anxiety  4.  Patient was encouraged to abstain from drug and alcohol use. 5.Patient was encouraged to attend group and work on coping strategies. 6.  Will consult social worker to help with a safe discharge plan 7.  Hospitalist consulted to help manage hypertension  I certify that inpatient services furnished can reasonably be expected to improve the patient's condition.    Lewanda Rife, MD

## 2022-12-24 NOTE — Group Note (Signed)
Date:  12/24/2022 Time:  10:11 PM  Group Topic/Focus:  Wrap-Up Group:   The focus of this group is to help patients review their daily goal of treatment and discuss progress on daily workbooks.    Participation Level:  Did Not Attend   Katina Dung 12/24/2022, 10:11 PM

## 2022-12-24 NOTE — Progress Notes (Signed)
   12/23/22 2000  Psych Admission Type (Psych Patients Only)  Admission Status Voluntary  Psychosocial Assessment  Patient Complaints Anxiety;Depression  Eye Contact Brief  Facial Expression Flat  Affect Anxious  Speech Logical/coherent  Interaction Assertive  Motor Activity Slow  Appearance/Hygiene In scrubs;Improved  Behavior Characteristics Cooperative;Appropriate to situation  Mood Pleasant  Aggressive Behavior  Effect No apparent injury  Thought Process  Coherency WDL  Content WDL  Delusions None reported or observed  Perception Hallucinations  Hallucination Auditory  Judgment WDL  Confusion None  Danger to Self  Current suicidal ideation? Denies  Agreement Not to Harm Self Yes  Description of Agreement verbal  Danger to Others  Danger to Others None reported or observed   Patient alert and oriented x 4, affect is blunted thoughts are organized, denies SI/HI/AVH. 15 minutes safety checks maintained.

## 2022-12-24 NOTE — Group Note (Signed)
Date:  12/24/2022 Time:  1:10 PM  Group Topic/Focus:  Conflict Resolution:   The focus of this group is to discuss the conflict resolution process and how it may be used upon discharge. Emotional Education:   The focus of this group is to discuss what feelings/emotions are, and how they are experienced. Managing Feelings:   The focus of this group is to identify what feelings patients have difficulty handling and develop a plan to handle them in a healthier way upon discharge.    Participation Level:  Did Not Attend  Participation Quality:    Affect:    Cognitive:    Insight:   Engagement in Group:    Modes of Intervention:    Additional Comments:    Lynna Zamorano 12/24/2022, 1:10 PM

## 2022-12-24 NOTE — Plan of Care (Signed)
  Problem: Education: Goal: Mental status will improve Outcome: Progressing   Problem: Activity: Goal: Interest or engagement in activities will improve Outcome: Progressing   

## 2022-12-24 NOTE — BHH Suicide Risk Assessment (Signed)
[facility] Admission Suicide Risk Assessment   Nursing information obtained from:  Patient Demographic factors:  Male, Caucasian, Low socioeconomic status Loss Factors:  Decline in physical health Historical Factors:  Prior suicide attempts   Principal Problem: MDD (major depressive disorder), recurrent episode, severe (HCC) Diagnosis:  Principal Problem:   MDD (major depressive disorder), recurrent episode, severe (HCC)  Subjective Data: Patient is a 62 y/o male who was transferred transferred from inpatient psychiatry to medical floor for concerns of SVT on 12/10 /2024. He had initially been admitted to inpatient psychiatry because he had been "out of meds". Also reports experiencing suicidal ideations at the time with plans of hanging himself and had been hearing command auditory hallucinations to kill himself.  Patient was transferred back to psych unit yesterday after medical clearance.  Continued Clinical Symptoms:  Alcohol Use Disorder Identification Test Final Score (AUDIT): 0 The "Alcohol Use Disorders Identification Test", Guidelines for Use in Primary Care, Second Edition.  World Science writer [facility]). Score between 0-7:  no or low risk or alcohol related problems. Score between 8-15:  moderate risk of alcohol related problems. Score between 16-19:  high risk of alcohol related problems. Score 20 or above:  warrants further diagnostic evaluation for alcohol dependence and treatment.   CLINICAL FACTORS:   Bipolar Disorder:   Depressive phase Previous Psychiatric Diagnoses and Treatments   Musculoskeletal: Strength & Muscle Tone: within normal limits Gait & Station: normal Patient leans: N/A  Psychiatric Specialty Exam:  Presentation  General Appearance:  Appropriate for Environment  Eye Contact: Fair  Speech: Clear and Coherent; Slow  Speech Volume: Decreased  Handedness: Right   Mood and Affect  Mood: Anxious; Depressed  Affect: Flat   Thought  Process  Thought Processes: Coherent; Goal Directed; Linear  Descriptions of Associations:Intact  Orientation:Full (Time, Place and Person)  Thought Content:Logical  History of Schizophrenia/Schizoaffective disorder:No  Duration of Psychotic Symptoms:Greater than six months  Hallucinations:AH Ideas of Reference:None  Suicidal Thoughts:Yes Homicidal Thoughts:No  Sensorium  Memory: Immediate Fair  Judgment: Limited  Insight: Fair   Chartered certified accountant: Fair  Attention Span: Fair  Recall: [employer] of Knowledge: Fair  Language: Fair   Psychomotor Activity  Psychomotor Activity:No data recorded  Assets  Assets: Communication Skills; Housing   Sleep  Sleep:Fair   Physical Exam: Physical Exam Constitutional:      Appearance: Normal appearance.  HENT:     Head: Normocephalic and atraumatic.     Nose: No congestion.  Eyes:     Pupils: Pupils are equal, round, and reactive to light.  Pulmonary:     Effort: Pulmonary effort is normal.  Skin:    General: Skin is warm.  Neurological:     General: No focal deficit present.     Mental Status: He is alert and oriented to person, place, and time.    Review of Systems  Constitutional:  Negative for chills and fever.  HENT:  Negative for hearing loss and sore throat.   Eyes:  Negative for blurred vision and double vision.  Respiratory:  Negative for cough and shortness of breath.   Cardiovascular:  Negative for chest pain and palpitations.  Gastrointestinal:  Negative for nausea and vomiting.  Psychiatric/Behavioral:  Positive for depression, hallucinations and suicidal ideas. The patient is nervous/anxious.    Blood pressure (!) 153/112, pulse 80, temperature 97.7 F (36.5 C), temperature source Oral, resp. rate 19, height 5\' 9"  (1.753 m), weight 81.2 kg, SpO2 97%. Body mass index is 26.43  kg/m.   COGNITIVE FEATURES THAT CONTRIBUTE TO RISK:  Thought constriction (tunnel  vision)    SUICIDE RISK:   Moderate:  Frequent suicidal ideation with limited intensity, and duration, some specificity in terms of plans,   good self-control   PLAN OF CARE: Per H&P  I certify that inpatient services furnished can reasonably be expected to improve the patient's condition.   Lewanda Rife, MD 12/24/2022, 11:33 AM

## 2022-12-24 NOTE — Plan of Care (Signed)
  Problem: Education: Goal: Mental status will improve 12/24/2022 0846 by Trula Ore, RN Outcome: Progressing 12/24/2022 0846 by Trula Ore, RN Outcome: Progressing   Problem: Activity: Goal: Interest or engagement in activities will improve 12/24/2022 0846 by Trula Ore, RN Outcome: Progressing 12/24/2022 0846 by Trula Ore, RN Outcome: Progressing 12/24/2022 0846 by Trula Ore, RN Outcome: Progressing

## 2022-12-24 NOTE — Plan of Care (Signed)
  Problem: Activity: Goal: Interest or engagement in activities will improve 12/24/2022 0846 by Trula Ore, RN Outcome: Progressing 12/24/2022 0846 by Trula Ore, RN Outcome: Progressing   Problem: Education: Goal: Mental status will improve 12/24/2022 0846 by Trula Ore, RN Outcome: Progressing 12/24/2022 0846 by Trula Ore, RN Outcome: Progressing

## 2022-12-24 NOTE — Plan of Care (Signed)
  Problem: Education: Goal: Mental status will improve 12/24/2022 0846 by Trula Ore, RN Outcome: Progressing 12/24/2022 0846 by Trula Ore, RN Outcome: Progressing 12/24/2022 0846 by Trula Ore, RN Outcome: Progressing 12/24/2022 0846 by Trula Ore, RN Outcome: Progressing   Problem: Education: Goal: Emotional status will improve Outcome: Progressing

## 2022-12-24 NOTE — Plan of Care (Signed)
  Problem: Education: Goal: Mental status will improve 12/24/2022 0846 by Trula Ore, RN Outcome: Progressing 12/24/2022 0846 by Trula Ore, RN Outcome: Progressing 12/24/2022 0846 by Trula Ore, RN Outcome: Progressing   Problem: Activity: Goal: Interest or engagement in activities will improve 12/24/2022 0846 by Trula Ore, RN Outcome: Progressing 12/24/2022 0846 by Trula Ore, RN Outcome: Progressing 12/24/2022 0846 by Trula Ore, RN Outcome: Progressing

## 2022-12-24 NOTE — Assessment & Plan Note (Signed)
Mgmt per primary service, psychiatry

## 2022-12-24 NOTE — Progress Notes (Signed)
   12/24/22 1730  Psych Admission Type (Psych Patients Only)  Admission Status Voluntary  Psychosocial Assessment  Patient Complaints Anxiety;Depression  Eye Contact Brief  Facial Expression Flat  Affect Anxious  Speech Logical/coherent  Interaction Assertive  Motor Activity Slow  Appearance/Hygiene In scrubs  Behavior Characteristics Cooperative  Mood Pleasant  Aggressive Behavior  Effect No apparent injury  Thought Process  Coherency WDL  Content WDL  Delusions None reported or observed  Perception Hallucinations  Hallucination Auditory  Judgment WDL  Confusion None  Danger to Self  Current suicidal ideation? Denies (Denies)  Agreement Not to Harm Self Yes  Description of Agreement verbal  Danger to Others  Danger to Others None reported or observed   20:30: No changes, remains stable.

## 2022-12-24 NOTE — Consult Note (Signed)
Initial Consultation Note   Patient: [name] MWU:[id number] DOB: 1960/03/18 PCP: [name], PA DOA: 12/23/2022 DOS: the patient was seen and examined on 12/24/2022 Primary service: Lewanda Rife, MD  Referring physician: Dr. Marval Regal  Reason for consult: Elevated blood pressure   Assessment and Plan: * MDD (major depressive disorder), recurrent episode, severe (HCC) Mgmt per primary service, psychiatry  Essential hypertension BP's yesterday 12/23/22 upon d/c to [facility] were 128/88 and 126/91.   Note that Toprol-XL was prescribed and on discharge summary but has not been resumed by [facility] team.   Resume Toprol-XL 50 mg daily As needed oral hydralazine 25 mg Q8H PRN if SBP>160 or DBP>95 despite metoprolol Follow up with PCP and/or cardiology within 1-2 weeks of discharge  Atrial flutter (HCC) Resume home Toprol-XL Not on anticoagulation due to CHA2DS2-Vasc score of 1. Follow up outpatient with Cardiology     [facility] will sign off at present, please call us again when needed.  HPI: [name] is a 62 y.o. male with past medical history of major depressive failure with psychosis, hypertension, substance abuse, admitted from medical floor to behavioral health unit on 12/23/2022, yesterday, following medical admission for presumed SVT that was later revealed to be A-flutter, revealed after failure to convert with adenosine.  Pt was treated initially with amiodarone drip and cardiology was consulted.  Patient's HR and BP's were controlled on metoprolol at time of discharge to [facility] yesterday.  Medicine is consulted today due to elevated blood pressures.  Pt denies acute complaints or symptoms including chest pain, palpitations, dizziness, shortness of breath, fever/chills, nausea/vomiting, diarrhea or other symptoms.    Review of Systems: As mentioned in the history of present illness. All other systems reviewed and are negative. 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 echo 5/17: EF 60-65%, no RWMA, LV dias fxn nl, LA mildly dilated, RV sys fxn nl, PASP nl, moderate sized circumferential pericardial effusion was identified, 2.12 cm around the LV free wall, <1 cm around the RV free wall. Features were not c/w tamponade physiology   PTSD (post-traumatic stress disorder)    Witnessed brother's suicide.   Restless leg syndrome    Seizures (HCC)    Syncope    Past Surgical History:  Procedure Laterality Date   CYSTOSCOPY WITH URETEROSCOPY AND STENT PLACEMENT     ESOPHAGOGASTRODUODENOSCOPY N/A 01/11/2016   Procedure: ESOPHAGOGASTRODUODENOSCOPY (EGD);  Surgeon: Charlott Rakes, MD;  Location: [facility] ENDOSCOPY;  Service: Endoscopy;  Laterality: N/A;   ESOPHAGOGASTRODUODENOSCOPY N/A 04/09/2020   Procedure: ESOPHAGOGASTRODUODENOSCOPY (EGD);  Surgeon: [name], MD;  Location: [facility] ENDOSCOPY;  Service: Gastroenterology;  Laterality: N/A;   INCISION AND DRAINAGE ABSCESS Left 01/02/2018   Procedure: INCISION AND DRAINAGE LEFT KNEE;  Surgeon: Deeann Saint, MD;  Location: [facility] ORS;  Service: Orthopedics;  Laterality: Left;   JOINT REPLACEMENT Right    TKR   KNEE ARTHROSCOPY Right 06/25/2014   Procedure: ARTHROSCOPY KNEE;  Surgeon: Deeann Saint, MD;  Location: [facility] ORS;  Service: Orthopedics;  Laterality: Right;  partial arthroscopic medial menisectomy  LAPAROSCOPIC APPENDECTOMY N/A 06/02/2021   Procedure: APPENDECTOMY LAPAROSCOPIC;  Surgeon: Campbell Lerner, MD;  Location: [facility] ORS;  Service: General;  Laterality: N/A;   TOTAL KNEE ARTHROPLASTY Right 04/22/2015   Procedure: TOTAL KNEE ARTHROPLASTY;  Surgeon: Deeann Saint, MD;   Location: [facility] ORS;  Service: Orthopedics;  Laterality: Right;   TOTAL KNEE ARTHROPLASTY Left 10/30/2017   Procedure: TOTAL KNEE ARTHROPLASTY;  Surgeon: Deeann Saint, MD;  Location: [facility] ORS;  Service: Orthopedics;  Laterality: Left;   TOTAL KNEE REVISION Left 01/02/2018   Procedure: poly exchange of tibia and patella left knee;  Surgeon: Deeann Saint, MD;  Location: [facility] ORS;  Service: Orthopedics;  Laterality: Left;   UMBILICAL HERNIA REPAIR  06/02/2021   Procedure: HERNIA REPAIR UMBILICAL ADULT;  Surgeon: Campbell Lerner, MD;  Location: [facility] ORS;  Service: General;;   Social History:  reports that he quit smoking about 38 years ago. His smoking use included cigarettes. He started smoking about 58 years ago. He has a 15 pack-year smoking history. He has never used smokeless tobacco. He reports that he does not currently use alcohol. He reports current drug use. Drugs: Marijuana and Cocaine.  No Known Allergies  Family History  Problem Relation Age of Onset   CVA Mother        deceased at age 19   Depression Brother        Died by suicide at age 86    Prior to Admission medications   Medication Sig Start Date End Date Taking? Authorizing Provider  metoprolol succinate (TOPROL-XL) 50 MG 24 hr tablet Take 50 mg by mouth daily. 12/06/22   [provider]  tamsulosin (FLOMAX) 0.4 MG CAPS capsule Take 1 capsule (0.4 mg total) by mouth daily after supper. 12/22/22 06/20/23  Gillis Santa, MD  traMADol (ULTRAM) 50 MG tablet Take 50 mg by mouth every 6 (six) hours as needed.    [provider]    Physical Exam: Vitals:   12/23/22 1523 12/23/22 1731 12/24/22 0625  BP: 128/88 (!) 126/91 (!) 153/112  Pulse: 65 71 80  Resp: 18  19  Temp: 97.7 F (36.5 C)    TempSrc: Oral    SpO2: 98% 100% 97%  Weight: 81.2 kg    Height: 5\' 9"  (1.753 m)     General exam: awake, alert, no acute distress HEENT: moist mucus membranes, hearing grossly normal  Respiratory system: CTA, no  wheezes, rales or rhonchi, normal respiratory effort. Cardiovascular system: normal S1/S2, RRR, no JVD, murmurs, rubs, gallops, no pedal edema.   Gastrointestinal system: soft, NT, ND, no HSM felt, +bowel sounds. Central nervous system: A&O x3. no gross focal neurologic deficits, normal speech Extremities: moves all, no edema, normal tone Skin: dry, intact, no rashes seen on visualized skin   Data Reviewed:   Most recent labs from 12/13 reviewed --- BMP normal except glucose 104.  Normal CBC      Family Communication: None  Primary team communication: Case discussed with Dr. Marval Regal Thank you very much for involving [place] in the care of your patient.  Author: Pennie Banter, DO 12/24/2022 11:52 AM  For on call review [email or web address].

## 2022-12-24 NOTE — Assessment & Plan Note (Signed)
Resume home Toprol-XL Not on anticoagulation due to CHA2DS2-Vasc score of 1. Follow up outpatient with Cardiology

## 2022-12-24 NOTE — Assessment & Plan Note (Addendum)
BP's yesterday 12/23/22 upon d/c to [facility] were 128/88 and 126/91.   Note that Toprol-XL was prescribed and on discharge summary but has not been resumed by [facility] team.   Resume Toprol-XL 50 mg daily As needed oral hydralazine 25 mg Q8H PRN if SBP>160 or DBP>95 despite metoprolol Follow up with PCP and/or cardiology within 1-2 weeks of discharge
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
## 2022-12-25 DIAGNOSIS — F1210 Cannabis abuse, uncomplicated: Secondary | ICD-10-CM | POA: Diagnosis not present

## 2022-12-25 DIAGNOSIS — F315 Bipolar disorder, current episode depressed, severe, with psychotic features: Secondary | ICD-10-CM | POA: Diagnosis not present

## 2022-12-25 MED ORDER — BISMUTH SUBSALICYLATE 262 MG/15ML PO SUSP
30.0000 mL | ORAL | Status: DC | PRN
  Administered 2022-12-26 – 2022-12-29 (×3): 30 mL via ORAL
  Filled 2022-12-25 (×2): qty 118

## 2022-12-25 MED ORDER — BUSPIRONE HCL 5 MG PO TABS
5.0000 mg | ORAL_TABLET | Freq: Three times a day (TID) | ORAL | Status: DC
Start: 2022-12-25 — End: 2022-12-28
  Administered 2022-12-25 – 2022-12-28 (×9): 5 mg via ORAL
  Filled 2022-12-25 (×9): qty 1

## 2022-12-25 NOTE — Progress Notes (Signed)
Pt calm and pleasant during assessment. Pt endorses passive SI, verbally contracts for safety. Pt observed interacting appropriately with staff and peers on the unit. Pt given education, support, and encouragement to be active in his treatment plan. Pt refused his melatonin tonight stating it gives him diarrhea. Pt being monitored Q 15 minutes for safety per unit protocol, remains safe on the unit

## 2022-12-25 NOTE — Progress Notes (Signed)
   12/25/22 0830  Psych Admission Type (Psych Patients Only)  Admission Status Voluntary  Psychosocial Assessment  Patient Complaints Anxiety;Agitation;Depression;Self-harm thoughts;Sleep disturbance;Worrying  Biomedical scientist;Watchful  Facial Expression Pained;Anxious  Affect Anxious  Speech Logical/coherent  Interaction Assertive  Motor Activity Slow  Appearance/Hygiene In scrubs;Unremarkable  Behavior Characteristics Cooperative;Appropriate to situation  Mood Preoccupied;Pleasant  Aggressive Behavior  Effect No apparent injury  Thought Process  Coherency WDL  Content WDL  Delusions None reported or observed  Perception Hallucinations  Hallucination Auditory;Visual (patient states "voices" and "flashbacks".)  Judgment WDL  Confusion None  Danger to Self  Current suicidal ideation? Passive (patient states it's "because of the voices")  Self-Injurious Behavior Some self-injurious ideation observed or expressed.  No lethal plan expressed   Agreement Not to Harm Self Yes  Description of Agreement Verbal  Danger to Others  Danger to Others None reported or observed

## 2022-12-25 NOTE — Progress Notes (Addendum)
Patient given PRN medication to help with anxiety/agitation. Patient tolerated medication well without any issues. Patient remains safe on the unit and will continue to be monitored.   12/25/22 0830  Pain Assessment  Pain Scale 0-10  Pain Score 10  Pain Location Head  Pain Orientation Posterior;Lower  Pain Radiating Towards Neck  Pain Descriptors / Indicators Headache  Pain Frequency Intermittent  Pain Onset Sudden  Pain Intervention(s) Medication (See eMAR)  Multiple Pain Sites No  Complaints & Interventions  Complains of Agitation;Anxiety;Restless;Hallucinations;Other (Comment) (sleep disturbance/insomnia)  Interventions Medication (see MAR) (Haldol/Benadryl)  Neuro symptoms relieved by Anti-anxiety medication

## 2022-12-25 NOTE — Group Note (Signed)
Date:  12/25/2022 Time:  8:52 PM  Group Topic/Focus:  Wrap-Up Group:   The focus of this group is to help patients review their daily goal of treatment and discuss progress on daily workbooks.    Participation Level:  Minimal  Participation Quality:  Attentive and Redirectable  Affect:  Appropriate and Depressed  Cognitive:  Alert and Appropriate  Insight: Improving  Engagement in Group:  Limited  Modes of Intervention:  Discussion  Additional Comments:   Pt expresses that he feels shaky all over and wishes he could sleep. However, day shift states that the patient slept most of the day. Pt states he was not getting his night medicines and just wants to be able to sleep. Tech will report this to RN staff at this time.   Maglione,Jill Ruppe E 12/25/2022, 8:52 PM

## 2022-12-25 NOTE — Progress Notes (Signed)
[facility] MD Progress Note  12/25/2022 6:35 PM [name]  MRN:  [id number]   Patient is a 62 y/o male who was transferred transferred from inpatient psychiatry to medical floor for concerns of SVT on 12/10 /2024. He had initially been admitted to inpatient psychiatry because he had been "out of meds". Also reports experiencing suicidal ideations at the time with plans of hanging himself and had been hearing command auditory hallucinations to kill himself.  Patient was transferred back to psych unit yesterday after medical clearance.   Subjective:  Chart reviewed, case discussed in multidisciplinary meeting, patient seen during rounds.  Not much change in mental status since yesterday.  Patient continues to endorse depressed mood.  He said his sleep is poor.  Patient was started on melatonin for sleep, will increase the dose of melatonin to 10 mg at bedtime.  Patient reports suicidal thoughts, denies any intention to harm himself on the unit.  Patient was encouraged to attend group and work on coping strategies.   Principal Problem: Bipolar disorder, current episode depressed, severe, with psychotic features (HCC) Diagnosis: Principal Problem:   Bipolar disorder, current episode depressed, severe, with psychotic features (HCC) Active Problems:   Essential hypertension   Atrial flutter (HCC)   MDD (major depressive disorder), recurrent episode, severe (HCC)   Mild tetrahydrocannabinol (THC) abuse  Past Psychiatric History: Patient has history of bipolar disorder and substance abuse   Past Medical History:  Past Medical History:  Diagnosis Date   Anxiety    Arthritis    knees and hands   Bipolar 1 disorder, depressed (HCC)    Bipolar disorder (HCC)    Depression    GERD (gastroesophageal reflux disease)    Hepatitis    HEP "C"   History of kidney stones    Hypertension    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 echo  5/17: EF 60-65%, no RWMA, LV dias fxn nl, LA mildly dilated, RV sys fxn nl, PASP nl, moderate sized circumferential pericardial effusion was identified, 2.12 cm around the LV free wall, <1 cm around the RV free wall. Features were not c/w tamponade physiology   PTSD (post-traumatic stress disorder)    Witnessed brother's suicide.   Restless leg syndrome    Seizures (HCC)    Syncope     Past Surgical History:  Procedure Laterality Date   CYSTOSCOPY WITH URETEROSCOPY AND STENT PLACEMENT     ESOPHAGOGASTRODUODENOSCOPY N/A 01/11/2016   Procedure: ESOPHAGOGASTRODUODENOSCOPY (EGD);  Surgeon: Charlott Rakes, MD;  Location: [facility] ENDOSCOPY;  Service: Endoscopy;  Laterality: N/A;   ESOPHAGOGASTRODUODENOSCOPY N/A 04/09/2020   Procedure: ESOPHAGOGASTRODUODENOSCOPY (EGD);  Surgeon: [name], MD;  Location: [facility] ENDOSCOPY;  Service: Gastroenterology;  Laterality: N/A;   INCISION AND DRAINAGE ABSCESS Left 01/02/2018   Procedure: INCISION AND DRAINAGE LEFT KNEE;  Surgeon: Deeann Saint, MD;  Location: [facility] ORS;  Service: Orthopedics;  Laterality: Left;   JOINT REPLACEMENT Right    TKR   KNEE ARTHROSCOPY Right 06/25/2014   Procedure: ARTHROSCOPY KNEE;  Surgeon: Deeann Saint, MD;  Location: [facility] ORS;  Service: Orthopedics;  Laterality: Right;  partial arthroscopic medial menisectomy   LAPAROSCOPIC APPENDECTOMY N/A 06/02/2021   Procedure: APPENDECTOMY LAPAROSCOPIC;  Surgeon: Campbell Lerner, MD;  Location: [facility] ORS;  Service: General;  Laterality: N/A;   TOTAL KNEE ARTHROPLASTY Right 04/22/2015   Procedure: TOTAL KNEE ARTHROPLASTY;  Surgeon: Deeann Saint, MD;  Location: [facility] ORS;  Service: Orthopedics;  Laterality: Right;   TOTAL KNEE ARTHROPLASTY Left 10/30/2017   Procedure: TOTAL KNEE ARTHROPLASTY;  Surgeon: Deeann Saint, MD;  Location: [facility] ORS;  Service: Orthopedics;  Laterality: Left;   TOTAL KNEE REVISION Left 01/02/2018   Procedure: poly exchange of tibia and patella left knee;  Surgeon: Deeann Saint, MD;  Location: [facility] ORS;  Service: Orthopedics;  Laterality: Left;   UMBILICAL HERNIA REPAIR  06/02/2021   Procedure: HERNIA REPAIR UMBILICAL ADULT;  Surgeon: Campbell Lerner, MD;  Location: [facility] ORS;  Service: General;;   Family History:  Family History  Problem Relation Age of Onset   CVA Mother        deceased at age 78   Depression Brother        Died by suicide at age 69    Social History:  Social History   Substance and Sexual Activity  Alcohol Use Not Currently   Comment: rare     Social History   Substance and Sexual Activity  Drug Use Yes   Types: Marijuana, Cocaine   Comment: has not used recently    Social History   Socioeconomic History   Marital status: Single    Spouse name: Not on file   Number of children: Not on file   Years of education: Not on file   Highest education level: Not on file  Occupational History   Not on file  Tobacco Use   Smoking status: Former    Current packs/day: 0.00    Average packs/day: 0.8 packs/day for 20.0 years (15.0 ttl pk-yrs)    Types: Cigarettes    Start date: 05/16/1964    Quit date: 05/16/1984    Years since quitting: 38.6   Smokeless tobacco: Never  Vaping Use   Vaping status: Never Used  Substance and Sexual Activity   Alcohol use: Not Currently    Comment: rare   Drug use: Yes    Types: Marijuana, Cocaine    Comment: has not used recently   Sexual activity: Not Currently  Other Topics Concern   Not on file  Social History Narrative   ** Merged History Encounter **       Social Drivers of Corporate investment banker Strain: Not on file  Food Insecurity: No Food Insecurity (12/23/2022)   Hunger Vital Sign    Worried About Running Out of Food in the Last Year: Never true    Ran Out of Food in the Last Year: Never true  Recent Concern: Food Insecurity - Food Insecurity Present (12/20/2022)   Hunger Vital Sign    Worried About Running Out of Food in the Last Year: Sometimes true    Ran Out of  Food in the Last Year: Sometimes true  Transportation Needs: Unmet Transportation Needs (12/23/2022)   PRAPARE - Administrator, Civil Service (Medical): Yes    Lack of Transportation (Non-Medical): Yes  Physical Activity: Not on file  Stress: Not on file  Social Connections: Not on file   Additional Social History:                         Sleep: Poor  Appetite:  Fair  Current Medications: Current Facility-Administered Medications  Medication Dose Route Frequency Provider Last Rate Last Admin   acetaminophen (TYLENOL) tablet 650 mg  650 mg Oral Q6H PRN Maryagnes Amos, FNP   650 mg at 12/25/22 1430   alum & mag hydroxide-simeth (MAALOX/MYLANTA) 200-200-20 MG/5ML suspension 30 mL  30 mL Oral Q4H PRN Starkes-Perry, Juel Burrow, FNP       busPIRone (BUSPAR) tablet 5 mg  5 mg Oral TID Lewanda Rife, MD   5 mg at 12/25/22 1651   haloperidol (HALDOL) tablet 5 mg  5 mg Oral TID PRN Maryagnes Amos, FNP   5 mg at 12/25/22 1651   And   diphenhydrAMINE (BENADRYL) capsule 50 mg  50 mg Oral TID PRN Maryagnes Amos, FNP   50 mg at 12/25/22 1651   divalproex (DEPAKOTE) DR tablet 500 mg  500 mg Oral BID Lewanda Rife, MD   500 mg at 12/25/22 1651   hydrALAZINE (APRESOLINE) tablet 25 mg  25 mg Oral Q6H PRN Esaw Grandchild A, DO       lurasidone (LATUDA) tablet 20 mg  20 mg Oral Q supper Lewanda Rife, MD   20 mg at 12/25/22 1651   magnesium hydroxide (MILK OF MAGNESIA) suspension 30 mL  30 mL Oral Daily PRN Maryagnes Amos, FNP       melatonin tablet 5 mg  5 mg Oral QHS Lewanda Rife, MD   5 mg at 12/24/22 2103   metoprolol succinate (TOPROL-XL) 24 hr tablet 50 mg  50 mg Oral Daily Esaw Grandchild A, DO   50 mg at 12/25/22 0831    Lab Results: No results found for this or any previous visit (from the past 48 hours).  Blood Alcohol level:  Lab Results  Component Value Date   ETH <10 12/18/2022   ETH <10 09/15/2022    Metabolic  Disorder Labs: Lab Results  Component Value Date   HGBA1C 5.5 04/16/2022   MPG 111 04/16/2022   MPG 105.41 01/18/2021   No results found for: "PROLACTIN" Lab Results  Component Value Date   CHOL 184 04/16/2022   TRIG 133 04/16/2022   HDL 32 (L) 04/16/2022   CHOLHDL 5.8 04/16/2022   VLDL 27 04/16/2022   LDLCALC 125 (H) 04/16/2022   LDLCALC 128 (H) 01/18/2021    Physical Exam: Musculoskeletal: Strength & Muscle Tone: within normal limits Gait & Station: normal Patient leans: N/A   Psychiatric Specialty Exam:   Presentation  General Appearance:  Appropriate for Environment   Eye Contact: Fair   Speech: Clear and Coherent; Slow   Speech Volume: Decreased   Handedness: Right     Mood and Affect  Mood: Anxious; Depressed   Affect: Flat     Thought Process  Thought Processes: Coherent; Goal Directed; Linear   Descriptions of Associations:Intact   Orientation:Full (Time, Place and Person)   Thought Content: Positive for suicidal ideations   History of Schizophrenia/Schizoaffective disorder:No   Duration of Psychotic Symptoms:Greater than six months   Hallucinations:AH Ideas of Reference:None   Suicidal Thoughts:Yes Homicidal Thoughts:No   Sensorium  Memory: Immediate Fair   Judgment: Limited   Insight: Fair     Chartered certified accountant: Fair   Attention Span: Fair   Recall: [employer] of Knowledge: Fair   Language: Fair     Psychomotor Activity  Psychomotor Activity:No data recorded   Assets  Assets: Communication Skills; Housing     Sleep  Sleep:Fair     Physical Exam: Physical Exam Constitutional:      Appearance: Normal appearance.  HENT:     Head: Normocephalic and atraumatic.     Nose: No congestion.  Eyes:     Pupils: Pupils are equal, round, and reactive to light.  Pulmonary:     Effort: Pulmonary  effort is normal.  Skin:    General: Skin is warm.  Neurological:     General: No  focal deficit present.     Mental Status: He is alert and oriented to person, place, and time.      Review of Systems  Constitutional:  Negative for chills and fever.  HENT:  Negative for hearing loss and sore throat.   Eyes:  Negative for blurred vision and double vision.  Respiratory:  Negative for cough and shortness of breath.   Cardiovascular:  Negative for chest pain and palpitations.  Gastrointestinal:  Negative for nausea and vomiting.  Psychiatric/Behavioral:  Positive for depression, hallucinations and suicidal ideas. The patient is nervous/anxious.    Blood pressure (!) 128/93, pulse 88, temperature 98.6 F (37 C), resp. rate 19, height 5\' 9"  (1.753 m), weight 81.2 kg, SpO2 98%. Body mass index is 26.43 kg/m.   Treatment Plan Summary: Daily contact with patient to assess and evaluate symptoms and progress in treatment and Medication management  .  Patient is admitted to locked unit precautions 2.  Will consult hospitalist team to manage medical conditions including hypertension 3.  Will start  -Depakote 500 mg by mouth twice daily for mood stabilization -Latuda 20 mg daily. To target psychotic symptoms -Increase the dose of Melatonin to 10 mg at bedtime for insomnia, 12/25/22 -BuSpar 5 mg by mouth twice daily for anxiety   4.  Patient was encouraged to abstain from drug and alcohol use. 5.Patient was encouraged to attend group and work on coping strategies. 6.  Will consult social worker to help with a safe discharge plan 7.  Hospitalist consulted to help manage hypertension. Dr.Griffith's input and help are helpful  Lewanda Rife, MD 12/25/2022, 6:35 PM

## 2022-12-25 NOTE — Group Note (Signed)
Date:  12/25/2022 Time:  11:47 AM  Group Topic/Focus:  Spirituality:   The focus of this group is to discuss how one's spirituality can aide in recovery.    Participation Level:  Did Not Attend   [name] 12/25/2022, 11:47 AM

## 2022-12-25 NOTE — Plan of Care (Signed)
  Problem: Education: Goal: Knowledge of [facility] Education information/materials will improve Outcome: Not Progressing Goal: Emotional status will improve Outcome: Not Progressing Goal: Mental status will improve Outcome: Not Progressing Goal: Verbalization of understanding the information provided will improve Outcome: Not Progressing   Problem: Activity: Goal: Interest or engagement in activities will improve Outcome: Not Progressing Goal: Sleeping patterns will improve Outcome: Not Progressing   Problem: Coping: Goal: Ability to verbalize frustrations and anger appropriately will improve Outcome: Not Progressing Goal: Ability to demonstrate self-control will improve Outcome: Not Progressing   Problem: Health Behavior/Discharge Planning: Goal: Identification of resources available to assist in meeting health care needs will improve Outcome: Not Progressing Goal: Compliance with treatment plan for underlying cause of condition will improve Outcome: Not Progressing   Problem: Physical Regulation: Goal: Ability to maintain clinical measurements within normal limits will improve Outcome: Not Progressing   Problem: Safety: Goal: Periods of time without injury will increase Outcome: Not Progressing   Problem: Education: Goal: Knowledge of General Education information will improve Description: Including pain rating scale, medication(s)/side effects and non-pharmacologic comfort measures Outcome: Not Progressing   Problem: Health Behavior/Discharge Planning: Goal: Ability to manage health-related needs will improve Outcome: Not Progressing   Problem: Clinical Measurements: Goal: Ability to maintain clinical measurements within normal limits will improve Outcome: Not Progressing Goal: Will remain free from infection Outcome: Not Progressing Goal: Diagnostic test results will improve Outcome: Not Progressing Goal: Respiratory complications will  improve Outcome: Not Progressing Goal: Cardiovascular complication will be avoided Outcome: Not Progressing   Problem: Activity: Goal: Risk for activity intolerance will decrease Outcome: Not Progressing   Problem: Nutrition: Goal: Adequate nutrition will be maintained Outcome: Not Progressing   Problem: Coping: Goal: Level of anxiety will decrease Outcome: Not Progressing   Problem: Elimination: Goal: Will not experience complications related to bowel motility Outcome: Not Progressing Goal: Will not experience complications related to urinary retention Outcome: Not Progressing   Problem: Pain Management: Goal: General experience of comfort will improve Outcome: Not Progressing   Problem: Safety: Goal: Ability to remain free from injury will improve Outcome: Not Progressing   Problem: Skin Integrity: Goal: Risk for impaired skin integrity will decrease Outcome: Not Progressing   Problem: Coping: Goal: Coping ability will improve Outcome: Not Progressing Goal: Will verbalize feelings Outcome: Not Progressing   Problem: Education: Goal: Will be free of psychotic symptoms Outcome: Not Progressing Goal: Knowledge of the prescribed therapeutic regimen will improve Outcome: Not Progressing   Problem: Self-Concept: Goal: Ability to identify factors that promote anxiety will improve Outcome: Not Progressing Goal: Level of anxiety will decrease Outcome: Not Progressing Goal: Ability to modify response to factors that promote anxiety will improve Outcome: Not Progressing

## 2022-12-26 DIAGNOSIS — F1210 Cannabis abuse, uncomplicated: Secondary | ICD-10-CM | POA: Diagnosis not present

## 2022-12-26 DIAGNOSIS — F315 Bipolar disorder, current episode depressed, severe, with psychotic features: Secondary | ICD-10-CM | POA: Diagnosis not present

## 2022-12-26 MED ORDER — LURASIDONE HCL 40 MG PO TABS
40.0000 mg | ORAL_TABLET | Freq: Every day | ORAL | Status: DC
  Administered 2022-12-26 – 2022-12-29 (×4): 40 mg via ORAL
  Filled 2022-12-26 (×4): qty 1

## 2022-12-26 MED ORDER — HYDROXYZINE HCL 25 MG PO TABS
25.0000 mg | ORAL_TABLET | Freq: Four times a day (QID) | ORAL | Status: DC | PRN
  Administered 2022-12-26 – 2022-12-29 (×8): 25 mg via ORAL
  Filled 2022-12-26 (×8): qty 1

## 2022-12-26 NOTE — Progress Notes (Signed)
Acadia Montana MD Progress Note  12/26/2022 3:56 PM [name]  MRN:  [id number]   Patient is a 62 y/o male who was transferred transferred from inpatient psychiatry to medical floor for concerns of SVT on 12/10 /2024. He had initially been admitted to inpatient psychiatry because he had been "out of meds". Also reports experiencing suicidal ideations at the time with plans of hanging himself and had been hearing command auditory hallucinations to kill himself.  Patient was transferred back to psych unit yesterday after medical clearance.   Subjective:  Chart reviewed, case discussed in multidisciplinary meeting, patient seen during rounds.  Not much change in mental status since yesterday.  Patient continues to endorse depressed mood.  He reports he is not able to sleep due to racing thoughts.  Patient agrees to increase the dose of Latuda to 40 mg. .  Patient continues to report suicidal thoughts, he feels hopeless and helpless.  Patient was provided with support and reassurance.  He denies any intention to harm himself on the unit.  He reports voices are still there but quieter than yesterday.  His appetite is improving.  Patient was encouraged to attend group and work on coping strategies.   Principal Problem: Bipolar disorder, current episode depressed, severe, with psychotic features (HCC) Diagnosis: Principal Problem:   Bipolar disorder, current episode depressed, severe, with psychotic features (HCC) Active Problems:   Essential hypertension   Atrial flutter (HCC)   MDD (major depressive disorder), recurrent episode, severe (HCC)   Mild tetrahydrocannabinol (THC) abuse  Past Psychiatric History: Patient has history of bipolar disorder and substance abuse   Past Medical History:  Past Medical History:  Diagnosis Date   Anxiety    Arthritis    knees and hands   Bipolar 1 disorder, depressed (HCC)    Bipolar disorder (HCC)    Depression    GERD (gastroesophageal reflux disease)     Hepatitis    HEP "C"   History of kidney stones    Hypertension    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 echo 5/17: EF 60-65%, no RWMA, LV dias fxn nl, LA mildly dilated, RV sys fxn nl, PASP nl, moderate sized circumferential pericardial effusion was identified, 2.12 cm around the LV free wall, <1 cm around the RV free wall. Features were not c/w tamponade physiology   PTSD (post-traumatic stress disorder)    Witnessed brother's suicide.   Restless leg syndrome    Seizures (HCC)    Syncope     Past Surgical History:  Procedure Laterality Date   CYSTOSCOPY WITH URETEROSCOPY AND STENT PLACEMENT     ESOPHAGOGASTRODUODENOSCOPY N/A 01/11/2016   Procedure: ESOPHAGOGASTRODUODENOSCOPY (EGD);  Surgeon: Charlott Rakes, MD;  Location: [facility] ENDOSCOPY;  Service: Endoscopy;  Laterality: N/A;   ESOPHAGOGASTRODUODENOSCOPY N/A 04/09/2020   Procedure: ESOPHAGOGASTRODUODENOSCOPY (EGD);  Surgeon: [name], MD;  Location: [facility] ENDOSCOPY;  Service: Gastroenterology;  Laterality: N/A;   INCISION AND DRAINAGE ABSCESS Left 01/02/2018   Procedure: INCISION AND DRAINAGE LEFT KNEE;  Surgeon: Deeann Saint, MD;  Location: [facility] ORS;  Service: Orthopedics;  Laterality: Left;   JOINT REPLACEMENT Right    TKR   KNEE ARTHROSCOPY Right 06/25/2014   Procedure: ARTHROSCOPY KNEE;  Surgeon: Deeann Saint, MD;  Location: [facility] ORS;  Service: Orthopedics;  Laterality: Right;  partial arthroscopic medial menisectomy   LAPAROSCOPIC APPENDECTOMY N/A 06/02/2021   Procedure: APPENDECTOMY LAPAROSCOPIC;  Surgeon: Campbell Lerner, MD;  Location: [facility]  ORS;  Service: General;  Laterality: N/A;   TOTAL KNEE ARTHROPLASTY Right 04/22/2015   Procedure: TOTAL KNEE ARTHROPLASTY;  Surgeon: Deeann Saint, MD;  Location: [facility] ORS;  Service: Orthopedics;  Laterality: Right;   TOTAL KNEE ARTHROPLASTY Left 10/30/2017   Procedure: TOTAL KNEE ARTHROPLASTY;  Surgeon: Deeann Saint, MD;   Location: [facility] ORS;  Service: Orthopedics;  Laterality: Left;   TOTAL KNEE REVISION Left 01/02/2018   Procedure: poly exchange of tibia and patella left knee;  Surgeon: Deeann Saint, MD;  Location: [facility] ORS;  Service: Orthopedics;  Laterality: Left;   UMBILICAL HERNIA REPAIR  06/02/2021   Procedure: HERNIA REPAIR UMBILICAL ADULT;  Surgeon: Campbell Lerner, MD;  Location: [facility] ORS;  Service: General;;   Family History:  Family History  Problem Relation Age of Onset   CVA Mother        deceased at age 100   Depression Brother        Died by suicide at age 40    Social History:  Social History   Substance and Sexual Activity  Alcohol Use Not Currently   Comment: rare     Social History   Substance and Sexual Activity  Drug Use Yes   Types: Marijuana, Cocaine   Comment: has not used recently    Social History   Socioeconomic History   Marital status: Single    Spouse name: Not on file   Number of children: Not on file   Years of education: Not on file   Highest education level: Not on file  Occupational History   Not on file  Tobacco Use   Smoking status: Former    Current packs/day: 0.00    Average packs/day: 0.8 packs/day for 20.0 years (15.0 ttl pk-yrs)    Types: Cigarettes    Start date: 05/16/1964    Quit date: 05/16/1984    Years since quitting: 38.6   Smokeless tobacco: Never  Vaping Use   Vaping status: Never Used  Substance and Sexual Activity   Alcohol use: Not Currently    Comment: rare   Drug use: Yes    Types: Marijuana, Cocaine    Comment: has not used recently   Sexual activity: Not Currently  Other Topics Concern   Not on file  Social History Narrative   ** Merged History Encounter **       Social Drivers of Health   Financial Resource Strain: Not on file  Food Insecurity: No Food Insecurity (12/23/2022)   Hunger Vital Sign    Worried About Running Out of Food in the Last Year: Never true    Ran Out of Food in the Last Year: Never true   Recent Concern: Food Insecurity - Food Insecurity Present (12/20/2022)   Hunger Vital Sign    Worried About Running Out of Food in the Last Year: Sometimes true    Ran Out of Food in the Last Year: Sometimes true  Transportation Needs: Unmet Transportation Needs (12/23/2022)   PRAPARE - Administrator, Civil Service (Medical): Yes    Lack of Transportation (Non-Medical): Yes  Physical Activity: Not on file  Stress: Not on file  Social Connections: Not on file   Additional Social History:                         Sleep: Poor  Appetite:  Fair  Current Medications: Current Facility-Administered Medications  Medication Dose Route Frequency Provider Last Rate Last Admin   acetaminophen (  TYLENOL) tablet 650 mg  650 mg Oral Q6H PRN Maryagnes Amos, FNP   650 mg at 12/26/22 1119   alum & mag hydroxide-simeth (MAALOX/MYLANTA) 200-200-20 MG/5ML suspension 30 mL  30 mL Oral Q4H PRN Maryagnes Amos, FNP       bismuth subsalicylate (PEPTO BISMOL) 262 MG/15ML suspension 30 mL  30 mL Oral Q4H PRN Myriam Forehand, NP   30 mL at 12/26/22 1120   busPIRone (BUSPAR) tablet 5 mg  5 mg Oral TID Lewanda Rife, MD   5 mg at 12/26/22 1119   haloperidol (HALDOL) tablet 5 mg  5 mg Oral TID PRN Maryagnes Amos, FNP   5 mg at 12/25/22 1651   And   diphenhydrAMINE (BENADRYL) capsule 50 mg  50 mg Oral TID PRN Maryagnes Amos, FNP   50 mg at 12/25/22 1651   divalproex (DEPAKOTE) DR tablet 500 mg  500 mg Oral BID Lewanda Rife, MD   500 mg at 12/26/22 0809   hydrALAZINE (APRESOLINE) tablet 25 mg  25 mg Oral Q6H PRN Esaw Grandchild A, DO       lurasidone (LATUDA) tablet 20 mg  20 mg Oral Q supper Lewanda Rife, MD   20 mg at 12/25/22 1651   magnesium hydroxide (MILK OF MAGNESIA) suspension 30 mL  30 mL Oral Daily PRN Maryagnes Amos, FNP       melatonin tablet 5 mg  5 mg Oral QHS Lewanda Rife, MD   5 mg at 12/24/22 2103   metoprolol succinate  (TOPROL-XL) 24 hr tablet 50 mg  50 mg Oral Daily Esaw Grandchild A, DO   50 mg at 12/26/22 1914    Lab Results: No results found for this or any previous visit (from the past 48 hours).  Blood Alcohol level:  Lab Results  Component Value Date   ETH <10 12/18/2022   ETH <10 09/15/2022    Metabolic Disorder Labs: Lab Results  Component Value Date   HGBA1C 5.5 04/16/2022   MPG 111 04/16/2022   MPG 105.41 01/18/2021   No results found for: "PROLACTIN" Lab Results  Component Value Date   CHOL 184 04/16/2022   TRIG 133 04/16/2022   HDL 32 (L) 04/16/2022   CHOLHDL 5.8 04/16/2022   VLDL 27 04/16/2022   LDLCALC 125 (H) 04/16/2022   LDLCALC 128 (H) 01/18/2021    Physical Exam: Musculoskeletal: Strength & Muscle Tone: within normal limits Gait & Station: normal Patient leans: N/A   Psychiatric Specialty Exam:   Presentation  General Appearance:  Appropriate for Environment   Eye Contact: Fair   Speech: Clear and Coherent; Slow   Speech Volume: Decreased   Handedness: Right     Mood and Affect  Mood: Anxious; Depressed   Affect: Flat     Thought Process  Thought Processes: Coherent; Goal Directed; Linear   Descriptions of Associations:Intact   Orientation:Full (Time, Place and Person)   Thought Content: Positive for suicidal ideations   History of Schizophrenia/Schizoaffective disorder:No   Duration of Psychotic Symptoms:Greater than six months   Hallucinations:AH Ideas of Reference:None   Suicidal Thoughts:Yes Homicidal Thoughts:No   Sensorium  Memory: Immediate Fair   Judgment: Limited   Insight: Fair     Chartered certified accountant: Fair   Attention Span: Fair   Recall: [employer] of Knowledge: Fair   Language: Fair     Psychomotor Activity  Psychomotor Activity:No data recorded   Assets  Assets: Manufacturing systems engineer; Housing  Sleep  Sleep:Fair     Physical Exam: Physical  Exam Constitutional:      Appearance: Normal appearance.  HENT:     Head: Normocephalic and atraumatic.     Nose: No congestion.  Eyes:     Pupils: Pupils are equal, round, and reactive to light.  Pulmonary:     Effort: Pulmonary effort is normal.  Skin:    General: Skin is warm.  Neurological:     General: No focal deficit present.     Mental Status: He is alert and oriented to person, place, and time.      Review of Systems  Constitutional:  Negative for chills and fever.  HENT:  Negative for hearing loss and sore throat.   Eyes:  Negative for blurred vision and double vision.  Respiratory:  Negative for cough and shortness of breath.   Cardiovascular:  Negative for chest pain and palpitations.  Gastrointestinal:  Negative for nausea and vomiting.  Psychiatric/Behavioral:  Positive for depression, hallucinations and suicidal ideas. The patient is nervous/anxious.    Blood pressure (!) 131/99, pulse 84, temperature 98 F (36.7 C), temperature source Oral, resp. rate 18, height 5\' 9"  (1.753 m), weight 81.2 kg, SpO2 97%. Body mass index is 26.43 kg/m.   Treatment Plan Summary: Daily contact with patient to assess and evaluate symptoms and progress in treatment and Medication management  .  Patient is admitted to locked unit precautions 2.  Will consult hospitalist team to manage medical conditions including hypertension 3.  Will start  -Depakote 500 mg by mouth twice daily for mood stabilization -Latuda 20 mg daily. To target psychotic symptoms -Increase the dose of Melatonin to 10 mg at bedtime for insomnia, 12/25/22 -BuSpar 5 mg by mouth twice daily for anxiety   4.  Patient was encouraged to abstain from drug and alcohol use. 5.Patient was encouraged to attend group and work on coping strategies. 6.  Will consult social worker to help with a safe discharge plan 7.  Hospitalist consulted to help manage hypertension. Dr.Griffith's input and help are helpful  Lewanda Rife, MD

## 2022-12-26 NOTE — Progress Notes (Signed)
Pt calm and pleasant during assessment. Pt endorses passive SI, verbally contracts for safety. Pt observed interacting appropriately with staff and peers on the unit. Pt given education, support, and encouragement to be active in his treatment plan. Pt refused his melatonin tonight stating it gives him diarrhea. Pt being monitored Q 15 minutes for safety per unit protocol, remains safe on the unit

## 2022-12-26 NOTE — Plan of Care (Signed)
Pt compliant with procedures on the unit  Problem: Education: Goal: Knowledge of  General Education information/materials will improve Outcome: Progressing Goal: Emotional status will improve Outcome: Progressing Goal: Mental status will improve Outcome: Progressing Goal: Verbalization of understanding the information provided will improve Outcome: Progressing   Problem: Activity: Goal: Interest or engagement in activities will improve Outcome: Progressing Goal: Sleeping patterns will improve Outcome: Progressing   Problem: Coping: Goal: Ability to verbalize frustrations and anger appropriately will improve Outcome: Progressing Goal: Ability to demonstrate self-control will improve Outcome: Progressing   Problem: Health Behavior/Discharge Planning: Goal: Identification of resources available to assist in meeting health care needs will improve Outcome: Progressing Goal: Compliance with treatment plan for underlying cause of condition will improve Outcome: Progressing   Problem: Physical Regulation: Goal: Ability to maintain clinical measurements within normal limits will improve Outcome: Progressing   Problem: Safety: Goal: Periods of time without injury will increase Outcome: Progressing   Problem: Education: Goal: Knowledge of General Education information will improve Description: Including pain rating scale, medication(s)/side effects and non-pharmacologic comfort measures Outcome: Progressing   Problem: Health Behavior/Discharge Planning: Goal: Ability to manage health-related needs will improve Outcome: Progressing   Problem: Clinical Measurements: Goal: Ability to maintain clinical measurements within normal limits will improve Outcome: Progressing Goal: Will remain free from infection Outcome: Progressing Goal: Diagnostic test results will improve Outcome: Progressing Goal: Respiratory complications will improve Outcome: Progressing Goal:  Cardiovascular complication will be avoided Outcome: Progressing   Problem: Activity: Goal: Risk for activity intolerance will decrease Outcome: Progressing   Problem: Nutrition: Goal: Adequate nutrition will be maintained Outcome: Progressing   Problem: Coping: Goal: Level of anxiety will decrease Outcome: Progressing   Problem: Elimination: Goal: Will not experience complications related to bowel motility Outcome: Progressing Goal: Will not experience complications related to urinary retention Outcome: Progressing   Problem: Pain Management: Goal: General experience of comfort will improve Outcome: Progressing   Problem: Safety: Goal: Ability to remain free from injury will improve Outcome: Progressing   Problem: Skin Integrity: Goal: Risk for impaired skin integrity will decrease Outcome: Progressing   Problem: Coping: Goal: Coping ability will improve Outcome: Progressing Goal: Will verbalize feelings Outcome: Progressing   Problem: Education: Goal: Will be free of psychotic symptoms Outcome: Progressing Goal: Knowledge of the prescribed therapeutic regimen will improve Outcome: Progressing   Problem: Self-Concept: Goal: Ability to identify factors that promote anxiety will improve Outcome: Progressing Goal: Level of anxiety will decrease Outcome: Progressing Goal: Ability to modify response to factors that promote anxiety will improve Outcome: Progressing

## 2022-12-26 NOTE — Group Note (Signed)
Recreation Therapy Group Note   Group Topic:Coping Skills  Group Date: 12/26/2022 Start Time: 1000 End Time: 1100 Facilitators: Rosina Lowenstein, LRT, CTRS Location:  Craft Room  Group Description: Mind Map.  Patient was provided a blank template of a diagram with 32 blank boxes in a tiered system, branching from the center (similar to a bubble chart). LRT directed patients to label the middle of the diagram "Coping Skills". LRT and patients then came up with 8 different coping skills as examples. Pt were directed to record their coping skills in the 2nd tier boxes closest to the center.  Patients would then share their coping skills with the group as LRT wrote them out. LRT gave a handout of 99 different coping skills at the end of group.   Goal Area(s) Addressed: Patients will be able to define "coping skills". Patient will identify new coping skills.  Patient will increase communication.   Affect/Mood: N/A   Participation Level: Did not attend    Clinical Observations/Individualized Feedback: [name] did not attend group.   Plan: Continue to engage patient in RT group sessions 2-3x/week.   Rosina Lowenstein, LRT, CTRS 12/26/2022 1:12 PM

## 2022-12-26 NOTE — Group Note (Signed)
[facility] LCSW Group Therapy Note    Group Date: 12/26/2022 Start Time: 1315 End Time: 1415  Type of Therapy and Topic:  Group Therapy:  Overcoming Obstacles  Participation Level:  [facility] PARTICIPATION LEVEL: None  Mood:  Description of Group:   In this group patients will be encouraged to explore what they see as obstacles to their own wellness and recovery. They will be guided to discuss their thoughts, feelings, and behaviors related to these obstacles. The group will process together ways to cope with barriers, with attention given to specific choices patients can make. Each patient will be challenged to identify changes they are motivated to make in order to overcome their obstacles. This group will be process-oriented, with patients participating in exploration of their own experiences as well as giving and receiving support and challenge from other group members.  Therapeutic Goals: 1. Patient will identify personal and current obstacles as they relate to admission. 2. Patient will identify barriers that currently interfere with their wellness or overcoming obstacles.  3. Patient will identify feelings, thought process and behaviors related to these barriers. 4. Patient will identify two changes they are willing to make to overcome these obstacles:    Summary of Patient Progress Patient was present in group.  Patient appeared attentive, however, did not engage in discussion.  Patient stated that he was overwhelmed with the loudness of group.  Therapeutic Modalities:   Cognitive Behavioral Therapy Solution Focused Therapy Motivational Interviewing Relapse Prevention Therapy   Harden Mo, LCSW

## 2022-12-26 NOTE — Plan of Care (Signed)
Patient stated that he is having anxiety because of " flash back." Patient denies SI,HI and AVH. Patient stated that the voices are minimal now.Patient appropriate with staff & peers. Appetite and energy level good. Support and encouragement given. Support and encouragement given.

## 2022-12-26 NOTE — Group Note (Signed)
Recreation Therapy Group Note   Group Topic:General Recreation  Group Date: 12/26/2022 Start Time: 1530 End Time: 1645 Facilitators: Rosina Lowenstein, LRT, CTRS Location:  Dayroom  Group Description: Recreation. Patients were given the opportunity to play cards, journal, or listen to music during group. Pt identified and conversated about things they enjoy doing in their free time and how they can continue to do that outside of the hospital.  Goal Area(s) Addressed: Patient will practice making a positive decision. Patient will have the opportunity to try a new leisure activity. Patient will communicate with peers and LRT.   Affect/Mood: N/A   Participation Level: Did not attend    Clinical Observations/Individualized Feedback: [name] did not attend group.   Plan: Continue to engage patient in RT group sessions 2-3x/week.   Rosina Lowenstein, LRT, CTRS 12/26/2022 5:26 PM

## 2022-12-26 NOTE — Group Note (Signed)
Date:  12/26/2022 Time:  9:52 PM  Group Topic/Focus:  Wrap-Up Group:   The focus of this group is to help patients review their daily goal of treatment and discuss progress on daily workbooks.    Participation Level:  Minimal  Participation Quality:  Appropriate and Attentive  Affect:  Appropriate  Cognitive:  Alert and Appropriate  Insight: Limited  Engagement in Group:  Limited  Modes of Intervention:  Discussion  Additional Comments:     Maglione,Slayter Moorhouse E 12/26/2022, 9:52 PM

## 2022-12-26 NOTE — BH IP Treatment Plan (Signed)
Interdisciplinary Treatment and Diagnostic Plan Update  12/26/2022 Time of Session: 09:26 [name] MRN: [id number]  Principal Diagnosis: Bipolar disorder, current episode depressed, severe, with psychotic features (HCC)  Secondary Diagnoses: Principal Problem:   Bipolar disorder, current episode depressed, severe, with psychotic features (HCC) Active Problems:   Essential hypertension   Atrial flutter (HCC)   MDD (major depressive disorder), recurrent episode, severe (HCC)   Mild tetrahydrocannabinol (THC) abuse   Current Medications:  Current Facility-Administered Medications  Medication Dose Route Frequency Provider Last Rate Last Admin   acetaminophen (TYLENOL) tablet 650 mg  650 mg Oral Q6H PRN Maryagnes Amos, FNP   650 mg at 12/26/22 0152   alum & mag hydroxide-simeth (MAALOX/MYLANTA) 200-200-20 MG/5ML suspension 30 mL  30 mL Oral Q4H PRN Maryagnes Amos, FNP       bismuth subsalicylate (PEPTO BISMOL) 262 MG/15ML suspension 30 mL  30 mL Oral Q4H PRN Myriam Forehand, NP       busPIRone (BUSPAR) tablet 5 mg  5 mg Oral TID Lewanda Rife, MD   5 mg at 12/26/22 0810   haloperidol (HALDOL) tablet 5 mg  5 mg Oral TID PRN Maryagnes Amos, FNP   5 mg at 12/25/22 1651   And   diphenhydrAMINE (BENADRYL) capsule 50 mg  50 mg Oral TID PRN Maryagnes Amos, FNP   50 mg at 12/25/22 1651   divalproex (DEPAKOTE) DR tablet 500 mg  500 mg Oral BID Lewanda Rife, MD   500 mg at 12/26/22 0809   hydrALAZINE (APRESOLINE) tablet 25 mg  25 mg Oral Q6H PRN Esaw Grandchild A, DO       lurasidone (LATUDA) tablet 20 mg  20 mg Oral Q supper Lewanda Rife, MD   20 mg at 12/25/22 1651   magnesium hydroxide (MILK OF MAGNESIA) suspension 30 mL  30 mL Oral Daily PRN Maryagnes Amos, FNP       melatonin tablet 5 mg  5 mg Oral QHS Lewanda Rife, MD   5 mg at 12/24/22 2103   metoprolol succinate (TOPROL-XL) 24 hr tablet 50 mg  50 mg Oral Daily Esaw Grandchild  A, DO   50 mg at 12/26/22 0809   PTA Medications: Medications Prior to Admission  Medication Sig Dispense Refill Last Dose/Taking   metoprolol succinate (TOPROL-XL) 50 MG 24 hr tablet Take 50 mg by mouth daily.      tamsulosin (FLOMAX) 0.4 MG CAPS capsule Take 1 capsule (0.4 mg total) by mouth daily after supper.      traMADol (ULTRAM) 50 MG tablet Take 50 mg by mouth every 6 (six) hours as needed.       Patient Stressors: Financial difficulties   Health problems   Medication change or noncompliance    Patient Strengths: Ability for insight  Forensic psychologist fund of knowledge  Motivation for treatment/growth   Treatment Modalities: Medication Management, Group therapy, Case management,  1 to 1 session with clinician, Psychoeducation, Recreational therapy.   Physician Treatment Plan for Primary Diagnosis: Bipolar disorder, current episode depressed, severe, with psychotic features (HCC) Long Term Goal(s): Improvement in symptoms so as ready for discharge   Short Term Goals: Ability to identify changes in lifestyle to reduce recurrence of condition will improve Ability to verbalize feelings will improve Ability to disclose and discuss suicidal ideas Ability to demonstrate self-control will improve Ability to identify and develop effective coping behaviors will improve Ability to maintain clinical measurements within normal limits will improve Compliance  with prescribed medications will improve Ability to identify triggers associated with substance abuse/mental health issues will improve  Medication Management: Evaluate patient's response, side effects, and tolerance of medication regimen.  Therapeutic Interventions: 1 to 1 sessions, Unit Group sessions and Medication administration.  Evaluation of Outcomes: Not Met  Physician Treatment Plan for Secondary Diagnosis: Principal Problem:   Bipolar disorder, current episode depressed, severe, with psychotic features  (HCC) Active Problems:   Essential hypertension   Atrial flutter (HCC)   MDD (major depressive disorder), recurrent episode, severe (HCC)   Mild tetrahydrocannabinol (THC) abuse  Long Term Goal(s): Improvement in symptoms so as ready for discharge   Short Term Goals: Ability to identify changes in lifestyle to reduce recurrence of condition will improve Ability to verbalize feelings will improve Ability to disclose and discuss suicidal ideas Ability to demonstrate self-control will improve Ability to identify and develop effective coping behaviors will improve Ability to maintain clinical measurements within normal limits will improve Compliance with prescribed medications will improve Ability to identify triggers associated with substance abuse/mental health issues will improve     Medication Management: Evaluate patient's response, side effects, and tolerance of medication regimen.  Therapeutic Interventions: 1 to 1 sessions, Unit Group sessions and Medication administration.  Evaluation of Outcomes: Not Met   RN Treatment Plan for Primary Diagnosis: Bipolar disorder, current episode depressed, severe, with psychotic features (HCC) Long Term Goal(s): Knowledge of disease and therapeutic regimen to maintain health will improve  Short Term Goals: Ability to remain free from injury will improve, Ability to verbalize frustration and anger appropriately will improve, Ability to demonstrate self-control, Ability to participate in decision making will improve, Ability to verbalize feelings will improve, Ability to disclose and discuss suicidal ideas, Ability to identify and develop effective coping behaviors will improve, and Compliance with prescribed medications will improve  Medication Management: RN will administer medications as ordered by provider, will assess and evaluate patient's response and provide education to patient for prescribed medication. RN will report any adverse and/or  side effects to prescribing provider.  Therapeutic Interventions: 1 on 1 counseling sessions, Psychoeducation, Medication administration, Evaluate responses to treatment, Monitor vital signs and CBGs as ordered, Perform/monitor CIWA, COWS, AIMS and Fall Risk screenings as ordered, Perform wound care treatments as ordered.  Evaluation of Outcomes: Not Met   LCSW Treatment Plan for Primary Diagnosis: Bipolar disorder, current episode depressed, severe, with psychotic features (HCC) Long Term Goal(s): Safe transition to appropriate next level of care at discharge, Engage patient in therapeutic group addressing interpersonal concerns.  Short Term Goals: Engage patient in aftercare planning with referrals and resources, Increase social support, Increase ability to appropriately verbalize feelings, Increase emotional regulation, Facilitate acceptance of mental health diagnosis and concerns, Facilitate patient progression through stages of change regarding substance use diagnoses and concerns, Identify triggers associated with mental health/substance abuse issues, and Increase skills for wellness and recovery  Therapeutic Interventions: Assess for all discharge needs, 1 to 1 time with Social worker, Explore available resources and support systems, Assess for adequacy in community support network, Educate family and significant other(s) on suicide prevention, Complete Psychosocial Assessment, Interpersonal group therapy.  Evaluation of Outcomes: Not Met   Progress in Treatment: Attending groups: No. Participating in groups: No. Taking medication as prescribed: Yes. Toleration medication: Yes. Family/Significant other contact made: No, will contact:  if given permission. Patient understands diagnosis: Yes. Discussing patient identified problems/goals with staff: Yes. Medical problems stabilized or resolved: Yes. Denies suicidal/homicidal ideation: Yes. Issues/concerns per patient self-inventory:  No. Other: none  New problem(s) identified: No, Describe:  none identified.   New Short Term/Long Term Goal(s): elimination of symptoms of psychosis, medication management for mood stabilization; elimination of SI thoughts; development of comprehensive mental wellness/sobriety plan.  Patient Goals: "I want to feel better."     Discharge Plan or Barriers: CSW will assist pt with development of an appropriate aftercare/discharge plan.   Reason for Continuation of Hospitalization: Depression Medication stabilization  Estimated Length of Stay: 1-7 days  Last 3 [place] Suicide Severity Risk Score: Flowsheet Row Admission (Current) from 12/23/2022 in [facility] INPATIENT BEHAV[facility]AL MEDICINE Admission (Discharged) from 12/20/2022 in [facility] REGIONAL CARDIAC MED PCU Admission (Discharged) from 12/18/2022 in [facility] INPATIENT BEHAVIORAL MEDICINE  C-SSRS RISK CATEGORY High Risk High Risk High Risk       Last PHQ 2/9 Scores:     No data to display          Scribe for Treatment Team: Glenis Smoker, LCSW 12/26/2022 10:14 AM

## 2022-12-27 DIAGNOSIS — F315 Bipolar disorder, current episode depressed, severe, with psychotic features: Secondary | ICD-10-CM | POA: Diagnosis not present

## 2022-12-27 MED ORDER — MELATONIN 5 MG PO TABS
10.0000 mg | ORAL_TABLET | Freq: Every day | ORAL | Status: DC
  Administered 2022-12-27: 10 mg via ORAL
  Filled 2022-12-27: qty 2

## 2022-12-27 NOTE — Plan of Care (Signed)
  Problem: Education: Goal: Knowledge of [facility] Education information/materials will improve Outcome: Not Progressing Goal: Emotional status will improve Outcome: Not Progressing Goal: Mental status will improve Outcome: Not Progressing Goal: Verbalization of understanding the information provided will improve Outcome: Not Progressing   Problem: Activity: Goal: Interest or engagement in activities will improve Outcome: Not Progressing Goal: Sleeping patterns will improve Outcome: Not Progressing   Problem: Coping: Goal: Ability to verbalize frustrations and anger appropriately will improve Outcome: Not Progressing Goal: Ability to demonstrate self-control will improve Outcome: Not Progressing   Problem: Health Behavior/Discharge Planning: Goal: Identification of resources available to assist in meeting health care needs will improve Outcome: Not Progressing Goal: Compliance with treatment plan for underlying cause of condition will improve Outcome: Not Progressing   Problem: Physical Regulation: Goal: Ability to maintain clinical measurements within normal limits will improve Outcome: Not Progressing   Problem: Safety: Goal: Periods of time without injury will increase Outcome: Not Progressing   Problem: Education: Goal: Knowledge of General Education information will improve Description: Including pain rating scale, medication(s)/side effects and non-pharmacologic comfort measures Outcome: Not Progressing   Problem: Health Behavior/Discharge Planning: Goal: Ability to manage health-related needs will improve Outcome: Not Progressing   Problem: Clinical Measurements: Goal: Ability to maintain clinical measurements within normal limits will improve Outcome: Not Progressing Goal: Will remain free from infection Outcome: Not Progressing Goal: Diagnostic test results will improve Outcome: Not Progressing Goal: Respiratory complications will  improve Outcome: Not Progressing Goal: Cardiovascular complication will be avoided Outcome: Not Progressing   Problem: Activity: Goal: Risk for activity intolerance will decrease Outcome: Not Progressing   Problem: Nutrition: Goal: Adequate nutrition will be maintained Outcome: Not Progressing   Problem: Coping: Goal: Level of anxiety will decrease Outcome: Not Progressing   Problem: Elimination: Goal: Will not experience complications related to bowel motility Outcome: Not Progressing Goal: Will not experience complications related to urinary retention Outcome: Not Progressing   Problem: Pain Management: Goal: General experience of comfort will improve Outcome: Not Progressing   Problem: Safety: Goal: Ability to remain free from injury will improve Outcome: Not Progressing   Problem: Skin Integrity: Goal: Risk for impaired skin integrity will decrease Outcome: Not Progressing   Problem: Coping: Goal: Coping ability will improve Outcome: Not Progressing Goal: Will verbalize feelings Outcome: Not Progressing   Problem: Education: Goal: Will be free of psychotic symptoms Outcome: Not Progressing Goal: Knowledge of the prescribed therapeutic regimen will improve Outcome: Not Progressing   Problem: Self-Concept: Goal: Ability to identify factors that promote anxiety will improve Outcome: Not Progressing Goal: Level of anxiety will decrease Outcome: Not Progressing Goal: Ability to modify response to factors that promote anxiety will improve Outcome: Not Progressing

## 2022-12-27 NOTE — Group Note (Signed)
Recreation Therapy Group Note   Group Topic:Goal Setting  Group Date: 12/27/2022 Start Time: 1000 End Time: 1100 Facilitators: Rosina Lowenstein, LRT, CTRS Location:  Craft Room  Group Description: Product/process development scientist. Patients were given many different magazines, a glue stick, markers, and a piece of cardstock paper. LRT and pts discussed the importance of having goals in life. LRT and pts discussed the difference between short-term and long-term goals, as well as what a SMART goal is. LRT encouraged pts to create a vision board, with images they picked and then cut out with safety scissors from the magazine, for themselves, that capture their short and long-term goals. LRT encouraged pts to show and explain their vision board to the group.   Goal Area(s) Addressed:  Patient will gain knowledge of short vs. long term goals.  Patient will identify goals for themselves. Patient will practice setting SMART goals. Patient will verbalize their goals to LRT and peers.   Affect/Mood: N/A   Participation Level: Did not attend    Clinical Observations/Individualized Feedback: [name] did not attend group.   Plan: Continue to engage patient in RT group sessions 2-3x/week.   Rosina Lowenstein, LRT, CTRS 12/27/2022 1:11 PM

## 2022-12-27 NOTE — Plan of Care (Signed)
Pt compliant with procedures on the unit  Problem: Education: Goal: Knowledge of  General Education information/materials will improve Outcome: Progressing Goal: Emotional status will improve Outcome: Progressing Goal: Mental status will improve Outcome: Progressing Goal: Verbalization of understanding the information provided will improve Outcome: Progressing   Problem: Activity: Goal: Interest or engagement in activities will improve Outcome: Progressing Goal: Sleeping patterns will improve Outcome: Progressing   Problem: Coping: Goal: Ability to verbalize frustrations and anger appropriately will improve Outcome: Progressing Goal: Ability to demonstrate self-control will improve Outcome: Progressing   Problem: Health Behavior/Discharge Planning: Goal: Identification of resources available to assist in meeting health care needs will improve Outcome: Progressing Goal: Compliance with treatment plan for underlying cause of condition will improve Outcome: Progressing   Problem: Physical Regulation: Goal: Ability to maintain clinical measurements within normal limits will improve Outcome: Progressing   Problem: Safety: Goal: Periods of time without injury will increase Outcome: Progressing   Problem: Education: Goal: Knowledge of General Education information will improve Description: Including pain rating scale, medication(s)/side effects and non-pharmacologic comfort measures Outcome: Progressing   Problem: Health Behavior/Discharge Planning: Goal: Ability to manage health-related needs will improve Outcome: Progressing   Problem: Clinical Measurements: Goal: Ability to maintain clinical measurements within normal limits will improve Outcome: Progressing Goal: Will remain free from infection Outcome: Progressing Goal: Diagnostic test results will improve Outcome: Progressing Goal: Respiratory complications will improve Outcome: Progressing Goal:  Cardiovascular complication will be avoided Outcome: Progressing   Problem: Activity: Goal: Risk for activity intolerance will decrease Outcome: Progressing   Problem: Nutrition: Goal: Adequate nutrition will be maintained Outcome: Progressing   Problem: Coping: Goal: Level of anxiety will decrease Outcome: Progressing   Problem: Elimination: Goal: Will not experience complications related to bowel motility Outcome: Progressing Goal: Will not experience complications related to urinary retention Outcome: Progressing   Problem: Pain Management: Goal: General experience of comfort will improve Outcome: Progressing   Problem: Safety: Goal: Ability to remain free from injury will improve Outcome: Progressing   Problem: Skin Integrity: Goal: Risk for impaired skin integrity will decrease Outcome: Progressing   Problem: Coping: Goal: Coping ability will improve Outcome: Progressing Goal: Will verbalize feelings Outcome: Progressing   Problem: Education: Goal: Will be free of psychotic symptoms Outcome: Progressing Goal: Knowledge of the prescribed therapeutic regimen will improve Outcome: Progressing   Problem: Self-Concept: Goal: Ability to identify factors that promote anxiety will improve Outcome: Progressing Goal: Level of anxiety will decrease Outcome: Progressing Goal: Ability to modify response to factors that promote anxiety will improve Outcome: Progressing

## 2022-12-27 NOTE — Group Note (Signed)
Recreation Therapy Group Note   Group Topic:Other  Group Date: 12/27/2022 Start Time: 1530 End Time: 1630 Facilitators: Clinton Gallant, CTRS Location:  Craft Room  LRT, nurses, social workers, Facilities manager, and patients got together for [employer]. LRT provided Christmas theme coloring pages, as well. Everyone voluntarily took turns singing and talking about their holiday traditions.    Affect/Mood: N/A   Participation Level: Did not attend    Clinical Observations/Individualized Feedback: [name] did not attend group.   Plan: Continue to engage patient in RT group sessions 2-3x/week.   [name], LRT, CTRS 12/27/2022 5:49 PM

## 2022-12-27 NOTE — Group Note (Signed)
Date:  12/27/2022 Time:  3:36 PM  Group Topic/Focus:  Activity Group:  The focus of the group is to promote activity for the patients to encourage them to go outside to the courtyard for some fresh air and some exercise.    Participation Level:  Did Not Attend   [name] [name] 12/27/2022, 3:36 PM

## 2022-12-27 NOTE — Progress Notes (Signed)
   12/27/22 1100  Psych Admission Type (Psych Patients Only)  Admission Status Voluntary  Psychosocial Assessment  Patient Complaints Anxiety;Depression;Hopelessness;Sleep disturbance  Eye Contact Fair;Watchful  Facial Expression Sullen  Affect Sullen  Speech Soft  Interaction Assertive  Motor Activity Slow  Appearance/Hygiene In scrubs  Behavior Characteristics Cooperative;Appropriate to situation  Mood Sullen;Pleasant  Aggressive Behavior  Effect No apparent injury  Thought Process  Coherency WDL  Content WDL  Delusions None reported or observed  Perception Hallucinations  Hallucination Auditory;Visual (per patient "mumbling", and "whispers")  Judgment WDL  Confusion None  Danger to Self  Current suicidal ideation? Denies (per patient "not right now".)  Self-Injurious Behavior No self-injurious ideation or behavior indicators observed or expressed   Agreement Not to Harm Self Yes  Description of Agreement Verbal  Danger to Others  Danger to Others None reported or observed

## 2022-12-27 NOTE — Progress Notes (Signed)
[facility] MD Progress Note  12/27/2022 1:56 PM [name]  MRN:  [id number] Subjective: [name] is seen on rounds.  Apparently, he was having some cardiac issues and was eventually transferred from medicine back to psychiatry.  He says that his heart is back in rhythm.  His biggest complaint is not sleeping at night and I talked to him about doxepin but it is probably not a good idea saying that he was in ventricular tachycardia so I am going to increase his melatonin instead.  He denies any suicidal ideation. Principal Problem: Bipolar disorder, current episode depressed, severe, with psychotic features (HCC) Diagnosis: Principal Problem:   Bipolar disorder, current episode depressed, severe, with psychotic features (HCC) Active Problems:   Essential hypertension   Atrial flutter (HCC)   MDD (major depressive disorder), recurrent episode, severe (HCC)   Mild tetrahydrocannabinol (THC) abuse  Total Time spent with patient: 15 minutes  Past Psychiatric History: Depression  Past Medical History:  Past Medical History:  Diagnosis Date   Anxiety    Arthritis    knees and hands   Bipolar 1 disorder, depressed (HCC)    Bipolar disorder (HCC)    Depression    GERD (gastroesophageal reflux disease)    Hepatitis    HEP "C"   History of kidney stones    Hypertension    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 echo 5/17: EF 60-65%, no RWMA, LV dias fxn nl, LA mildly dilated, RV sys fxn nl, PASP nl, moderate sized circumferential pericardial effusion was identified, 2.12 cm around the LV free wall, <1 cm around the RV free wall. Features were not c/w tamponade physiology   PTSD (post-traumatic stress disorder)    Witnessed brother's suicide.   Restless leg syndrome    Seizures (HCC)    Syncope     Past Surgical History:  Procedure Laterality Date   CYSTOSCOPY WITH URETEROSCOPY AND STENT PLACEMENT      ESOPHAGOGASTRODUODENOSCOPY N/A 01/11/2016   Procedure: ESOPHAGOGASTRODUODENOSCOPY (EGD);  Surgeon: Charlott Rakes, MD;  Location: [facility] ENDOSCOPY;  Service: Endoscopy;  Laterality: N/A;   ESOPHAGOGASTRODUODENOSCOPY N/A 04/09/2020   Procedure: ESOPHAGOGASTRODUODENOSCOPY (EGD);  Surgeon: [name], MD;  Location: [facility] ENDOSCOPY;  Service: Gastroenterology;  Laterality: N/A;   INCISION AND DRAINAGE ABSCESS Left 01/02/2018   Procedure: INCISION AND DRAINAGE LEFT KNEE;  Surgeon: Deeann Saint, MD;  Location: [facility] ORS;  Service: Orthopedics;  Laterality: Left;   JOINT REPLACEMENT Right    TKR   KNEE ARTHROSCOPY Right 06/25/2014   Procedure: ARTHROSCOPY KNEE;  Surgeon: Deeann Saint, MD;  Location: [facility] ORS;  Service: Orthopedics;  Laterality: Right;  partial arthroscopic medial menisectomy   LAPAROSCOPIC APPENDECTOMY N/A 06/02/2021   Procedure: APPENDECTOMY LAPAROSCOPIC;  Surgeon: Campbell Lerner, MD;  Location: [facility] ORS;  Service: General;  Laterality: N/A;   TOTAL KNEE ARTHROPLASTY Right 04/22/2015   Procedure: TOTAL KNEE ARTHROPLASTY;  Surgeon: Deeann Saint, MD;  Location: [facility] ORS;  Service: Orthopedics;  Laterality: Right;   TOTAL KNEE ARTHROPLASTY Left 10/30/2017   Procedure: TOTAL KNEE ARTHROPLASTY;  Surgeon: Deeann Saint, MD;  Location: [facility] ORS;  Service: Orthopedics;  Laterality: Left;   TOTAL KNEE REVISION Left 01/02/2018   Procedure: poly exchange of tibia and patella left knee;  Surgeon: Deeann Saint, MD;  Location: [facility] ORS;  Service: Orthopedics;  Laterality: Left;   UMBILICAL HERNIA REPAIR  06/02/2021   Procedure: HERNIA REPAIR UMBILICAL ADULT;  Surgeon: Campbell Lerner, MD;  Location: [facility] ORS;  Service: General;;   Family History:  Family History  Problem Relation Age of Onset   CVA Mother        deceased at age 37   Depression Brother        Died by suicide at age 2   Family Psychiatric  History: Unremarkable Social History:  Social History   Substance and Sexual  Activity  Alcohol Use Not Currently   Comment: rare     Social History   Substance and Sexual Activity  Drug Use Yes   Types: Marijuana, Cocaine   Comment: has not used recently    Social History   Socioeconomic History   Marital status: Single    Spouse name: Not on file   Number of children: Not on file   Years of education: Not on file   Highest education level: Not on file  Occupational History   Not on file  Tobacco Use   Smoking status: Former    Current packs/day: 0.00    Average packs/day: 0.8 packs/day for 20.0 years (15.0 ttl pk-yrs)    Types: Cigarettes    Start date: 05/16/1964    Quit date: 05/16/1984    Years since quitting: 38.6   Smokeless tobacco: Never  Vaping Use   Vaping status: Never Used  Substance and Sexual Activity   Alcohol use: Not Currently    Comment: rare   Drug use: Yes    Types: Marijuana, Cocaine    Comment: has not used recently   Sexual activity: Not Currently  Other Topics Concern   Not on file  Social History Narrative   ** Merged History Encounter **       Social Drivers of Corporate investment banker Strain: Not on file  Food Insecurity: No Food Insecurity (12/23/2022)   Hunger Vital Sign    Worried About Running Out of Food in the Last Year: Never true    Ran Out of Food in the Last Year: Never true  Recent Concern: Food Insecurity - Food Insecurity Present (12/20/2022)   Hunger Vital Sign    Worried About Running Out of Food in the Last Year: Sometimes true    Ran Out of Food in the Last Year: Sometimes true  Transportation Needs: Unmet Transportation Needs (12/23/2022)   PRAPARE - Administrator, Civil Service (Medical): Yes    Lack of Transportation (Non-Medical): Yes  Physical Activity: Not on file  Stress: Not on file  Social Connections: Not on file   Additional Social History:                         Sleep: Poor  Appetite:  Fair  Current Medications: Current Facility-Administered  Medications  Medication Dose Route Frequency Provider Last Rate Last Admin   acetaminophen (TYLENOL) tablet 650 mg  650 mg Oral Q6H PRN Maryagnes Amos, FNP   650 mg at 12/26/22 1811   alum & mag hydroxide-simeth (MAALOX/MYLANTA) 200-200-20 MG/5ML suspension 30 mL  30 mL Oral Q4H PRN Maryagnes Amos, FNP       bismuth subsalicylate (PEPTO BISMOL) 262 MG/15ML suspension 30 mL  30 mL Oral Q4H PRN Myriam Forehand, NP   30 mL at 12/26/22 1120   busPIRone (BUSPAR) tablet 5 mg  5 mg Oral TID Lewanda Rife, MD   5 mg at 12/27/22 1212   haloperidol (HALDOL) tablet 5 mg  5 mg Oral TID PRN Caryn Bee  S, FNP   5 mg at 12/27/22 0160   And   diphenhydrAMINE (BENADRYL) capsule 50 mg  50 mg Oral TID PRN Maryagnes Amos, FNP   50 mg at 12/26/22 2053   divalproex (DEPAKOTE) DR tablet 500 mg  500 mg Oral BID Lewanda Rife, MD   500 mg at 12/27/22 0836   hydrALAZINE (APRESOLINE) tablet 25 mg  25 mg Oral Q6H PRN Esaw Grandchild A, DO       hydrOXYzine (ATARAX) tablet 25 mg  25 mg Oral Q6H PRN Myriam Forehand, NP   25 mg at 12/27/22 1046   lurasidone (LATUDA) tablet 40 mg  40 mg Oral Q supper Lewanda Rife, MD   40 mg at 12/26/22 1703   magnesium hydroxide (MILK OF MAGNESIA) suspension 30 mL  30 mL Oral Daily PRN Maryagnes Amos, FNP       melatonin tablet 10 mg  10 mg Oral QHS Sarina Ill, DO       metoprolol succinate (TOPROL-XL) 24 hr tablet 50 mg  50 mg Oral Daily Esaw Grandchild A, DO   50 mg at 12/27/22 1093    Lab Results: No results found for this or any previous visit (from the past 48 hours).  Blood Alcohol level:  Lab Results  Component Value Date   ETH <10 12/18/2022   ETH <10 09/15/2022    Metabolic Disorder Labs: Lab Results  Component Value Date   HGBA1C 5.5 04/16/2022   MPG 111 04/16/2022   MPG 105.41 01/18/2021   No results found for: "PROLACTIN" Lab Results  Component Value Date   CHOL 184 04/16/2022   TRIG 133 04/16/2022    HDL 32 (L) 04/16/2022   CHOLHDL 5.8 04/16/2022   VLDL 27 04/16/2022   LDLCALC 125 (H) 04/16/2022   LDLCALC 128 (H) 01/18/2021    Physical Findings: AIMS:  , ,  ,  ,    CIWA:    COWS:     Musculoskeletal: Strength & Muscle Tone: within normal limits Gait & Station: normal Patient leans: N/A  Psychiatric Specialty Exam:  Presentation  General Appearance:  Appropriate for Environment  Eye Contact: Fair  Speech: Clear and Coherent; Slow  Speech Volume: Decreased  Handedness: Right   Mood and Affect  Mood: Anxious; Depressed  Affect: Flat   Thought Process  Thought Processes: Coherent; Goal Directed; Linear  Descriptions of Associations:Intact  Orientation:Full (Time, Place and Person)  Thought Content:Logical  History of Schizophrenia/Schizoaffective disorder:No  Duration of Psychotic Symptoms:Greater than six months  Hallucinations:No data recorded Ideas of Reference:None  Suicidal Thoughts:No data recorded Homicidal Thoughts:No data recorded  Sensorium  Memory: Immediate Fair  Judgment: Intact  Insight: Fair   Chartered certified accountant: Fair  Attention Span: Fair  Recall: [employer] of Knowledge: Fair  Language: Fair   Psychomotor Activity  Psychomotor Activity:No data recorded  Assets  Assets: Communication Skills; Housing   Sleep  Sleep:No data recorded    Blood pressure (!) 133/95, pulse 78, temperature 98.6 F (37 C), resp. rate 20, height 5\' 9"  (1.753 m), weight 81.2 kg, SpO2 97%. Body mass index is 26.43 kg/m.   Treatment Plan Summary: Daily contact with patient to assess and evaluate symptoms and progress in treatment, Medication management, and Plan increase melatonin to 10 mg at bedtime.  Sarina Ill, DO 12/27/2022, 1:57 PM

## 2022-12-27 NOTE — Group Note (Signed)
LCSW Group Therapy Note   Group Date: 12/27/2022 Start Time: 1300 End Time: 1405   Type of Therapy and Topic:  Group Therapy: Challenging Core Beliefs  Participation Level:  Active  Description of Group:  Patients were educated about core beliefs and asked to identify one harmful core belief that they have. Patients were asked to explore from where those beliefs originate. Patients were asked to discuss how those beliefs make them feel and the resulting behaviors of those beliefs. They were then be asked if those beliefs are true and, if so, what evidence they have to support them. Lastly, group members were challenged to replace those negative core beliefs with helpful beliefs.   Therapeutic Goals:   1. Patient will identify harmful core beliefs and explore the origins of such beliefs. 2. Patient will identify feelings and behaviors that result from those core beliefs. 3. Patient will discuss whether such beliefs are true. 4.  Patient will replace harmful core beliefs with helpful ones.  Summary of Patient Progress:  Patient actively engaged in processing and exploring how core beliefs are formed and how they impact thoughts, feelings, and behaviors. Patient proved open to input from peers and feedback from CSW. Patient demonstrated proficient  insight into the subject matter, was respectful and supportive of peers, and participated throughout the entire session.  Therapeutic Modalities: Cognitive Behavioral Therapy; Solution-Focused Therapy   [name], LCSWA 12/27/2022  2:42 PM

## 2022-12-27 NOTE — Progress Notes (Signed)
Pt calm and pleasant during assessment. Pt endorses passive SI, verbally contracts for safety. Pt observed interacting appropriately with staff and peers on the unit. Pt given education, support, and encouragement to be active in his treatment plan. Pt refused his melatonin tonight stating it gives him diarrhea. Pt being monitored Q 15 minutes for safety per unit protocol, remains safe on the unit

## 2022-12-27 NOTE — Group Note (Signed)
Date:  12/27/2022 Time:  11:02 PM  Group Topic/Focus:  Wrap-Up Group:   The focus of this group is to help patients review their daily goal of treatment and discuss progress on daily workbooks.    Participation Level:  Did Not Attend   Additional Comments:  Pt came to snack but walked around from his bedroom to the dayroom until wrap up group was over.   Maglione,Saachi Zale E 12/27/2022, 11:02 PM

## 2022-12-28 DIAGNOSIS — F315 Bipolar disorder, current episode depressed, severe, with psychotic features: Secondary | ICD-10-CM | POA: Diagnosis not present

## 2022-12-28 MED ORDER — TEMAZEPAM 15 MG PO CAPS
15.0000 mg | ORAL_CAPSULE | Freq: Every day | ORAL | Status: DC
  Administered 2022-12-28: 15 mg via ORAL
  Filled 2022-12-28 (×2): qty 1

## 2022-12-28 MED ORDER — VENLAFAXINE HCL ER 75 MG PO CP24
75.0000 mg | ORAL_CAPSULE | Freq: Every day | ORAL | Status: DC
  Administered 2022-12-28 – 2022-12-30 (×3): 75 mg via ORAL
  Filled 2022-12-28 (×3): qty 1

## 2022-12-28 NOTE — Progress Notes (Signed)
Pleasant and cooperative with care. Medication compliant. Appropriate with staff and peers. Started on restoril for sleep tonight. Visible in milieu. Voiced no concerns or complaints. Encouragement and support provided. Safety checks maintained. Meds given as prescribed. Pt receptive and remains safe on unit with q 15 min checks.

## 2022-12-28 NOTE — Plan of Care (Signed)
  Problem: Education: Goal: Knowledge of [facility] Education information/materials will improve Outcome: Not Progressing Goal: Emotional status will improve Outcome: Not Progressing Goal: Mental status will improve Outcome: Not Progressing Goal: Verbalization of understanding the information provided will improve Outcome: Not Progressing   Problem: Activity: Goal: Interest or engagement in activities will improve Outcome: Not Progressing Goal: Sleeping patterns will improve Outcome: Not Progressing   Problem: Coping: Goal: Ability to verbalize frustrations and anger appropriately will improve Outcome: Not Progressing Goal: Ability to demonstrate self-control will improve Outcome: Not Progressing   Problem: Health Behavior/Discharge Planning: Goal: Identification of resources available to assist in meeting health care needs will improve Outcome: Not Progressing Goal: Compliance with treatment plan for underlying cause of condition will improve Outcome: Not Progressing   Problem: Physical Regulation: Goal: Ability to maintain clinical measurements within normal limits will improve Outcome: Not Progressing   Problem: Safety: Goal: Periods of time without injury will increase Outcome: Not Progressing   Problem: Education: Goal: Knowledge of General Education information will improve Description: Including pain rating scale, medication(s)/side effects and non-pharmacologic comfort measures Outcome: Not Progressing   Problem: Health Behavior/Discharge Planning: Goal: Ability to manage health-related needs will improve Outcome: Not Progressing   Problem: Clinical Measurements: Goal: Ability to maintain clinical measurements within normal limits will improve Outcome: Not Progressing Goal: Will remain free from infection Outcome: Not Progressing Goal: Diagnostic test results will improve Outcome: Not Progressing Goal: Respiratory complications will  improve Outcome: Not Progressing Goal: Cardiovascular complication will be avoided Outcome: Not Progressing   Problem: Activity: Goal: Risk for activity intolerance will decrease Outcome: Not Progressing   Problem: Nutrition: Goal: Adequate nutrition will be maintained Outcome: Not Progressing   Problem: Coping: Goal: Level of anxiety will decrease Outcome: Not Progressing   Problem: Elimination: Goal: Will not experience complications related to bowel motility Outcome: Not Progressing Goal: Will not experience complications related to urinary retention Outcome: Not Progressing   Problem: Pain Management: Goal: General experience of comfort will improve Outcome: Not Progressing   Problem: Safety: Goal: Ability to remain free from injury will improve Outcome: Not Progressing   Problem: Skin Integrity: Goal: Risk for impaired skin integrity will decrease Outcome: Not Progressing   Problem: Coping: Goal: Coping ability will improve Outcome: Not Progressing Goal: Will verbalize feelings Outcome: Not Progressing   Problem: Education: Goal: Will be free of psychotic symptoms Outcome: Not Progressing Goal: Knowledge of the prescribed therapeutic regimen will improve Outcome: Not Progressing   Problem: Self-Concept: Goal: Ability to identify factors that promote anxiety will improve Outcome: Not Progressing Goal: Level of anxiety will decrease Outcome: Not Progressing Goal: Ability to modify response to factors that promote anxiety will improve Outcome: Not Progressing

## 2022-12-28 NOTE — Group Note (Signed)
Date:  12/28/2022 Time:  9:34 PM  Group Topic/Focus:  Managing Feelings:   The focus of this group is to identify what feelings patients have difficulty handling and develop a plan to handle them in a healthier way upon discharge.    Participation Level:  Active  Participation Quality:  Appropriate and Attentive  Affect:  Appropriate  Cognitive:  Alert and Appropriate  Insight: Appropriate and Good  Engagement in Group:  Developing/Improving and Engaged  Modes of Intervention:  Discussion, Rapport Building, and Support  Additional Comments:     Taccara Bushnell 12/28/2022, 9:34 PM

## 2022-12-28 NOTE — Plan of Care (Signed)
Pt denies SI/HI/AVH  Problem: Education: Goal: Knowledge of [facility] Education information/materials will improve Outcome: Progressing Goal: Emotional status will improve Outcome: Progressing Goal: Mental status will improve Outcome: Progressing Goal: Verbalization of understanding the information provided will improve Outcome: Progressing   Problem: Activity: Goal: Interest or engagement in activities will improve Outcome: Progressing Goal: Sleeping patterns will improve Outcome: Progressing   Problem: Coping: Goal: Ability to verbalize frustrations and anger appropriately will improve Outcome: Progressing Goal: Ability to demonstrate self-control will improve Outcome: Progressing   Problem: Health Behavior/Discharge Planning: Goal: Identification of resources available to assist in meeting health care needs will improve Outcome: Progressing Goal: Compliance with treatment plan for underlying cause of condition will improve Outcome: Progressing   Problem: Physical Regulation: Goal: Ability to maintain clinical measurements within normal limits will improve Outcome: Progressing   Problem: Safety: Goal: Periods of time without injury will increase Outcome: Progressing   Problem: Education: Goal: Knowledge of General Education information will improve Description: Including pain rating scale, medication(s)/side effects and non-pharmacologic comfort measures Outcome: Progressing   Problem: Health Behavior/Discharge Planning: Goal: Ability to manage health-related needs will improve Outcome: Progressing   Problem: Clinical Measurements: Goal: Ability to maintain clinical measurements within normal limits will improve Outcome: Progressing Goal: Will remain free from infection Outcome: Progressing Goal: Diagnostic test results will improve Outcome: Progressing Goal: Respiratory complications will improve Outcome: Progressing Goal: Cardiovascular  complication will be avoided Outcome: Progressing   Problem: Activity: Goal: Risk for activity intolerance will decrease Outcome: Progressing   Problem: Nutrition: Goal: Adequate nutrition will be maintained Outcome: Progressing   Problem: Coping: Goal: Level of anxiety will decrease Outcome: Progressing   Problem: Elimination: Goal: Will not experience complications related to bowel motility Outcome: Progressing Goal: Will not experience complications related to urinary retention Outcome: Progressing   Problem: Pain Management: Goal: General experience of comfort will improve Outcome: Progressing   Problem: Safety: Goal: Ability to remain free from injury will improve Outcome: Progressing   Problem: Skin Integrity: Goal: Risk for impaired skin integrity will decrease Outcome: Progressing   Problem: Coping: Goal: Coping ability will improve Outcome: Progressing Goal: Will verbalize feelings Outcome: Progressing   Problem: Education: Goal: Will be free of psychotic symptoms Outcome: Progressing Goal: Knowledge of the prescribed therapeutic regimen will improve Outcome: Progressing   Problem: Self-Concept: Goal: Ability to identify factors that promote anxiety will improve Outcome: Progressing Goal: Level of anxiety will decrease Outcome: Progressing Goal: Ability to modify response to factors that promote anxiety will improve Outcome: Progressing

## 2022-12-28 NOTE — Group Note (Signed)
[facility] LCSW Group Therapy Note   Group Date: 12/28/2022 Start Time: 1300 End Time: 1350   Type of Therapy/Topic:  Group Therapy:  Emotion Regulation  Participation Level:  Did Not Attend   Description of Group:    The purpose of this group is to assist patients in learning to regulate negative emotions and experience positive emotions. Patients will be guided to discuss ways in which they have been vulnerable to their negative emotions. These vulnerabilities will be juxtaposed with experiences of positive emotions or situations, and patients challenged to use positive emotions to combat negative ones. Special emphasis will be placed on coping with negative emotions in conflict situations, and patients will process healthy conflict resolution skills.  Therapeutic Goals: Patient will identify two positive emotions or experiences to reflect on in order to balance out negative emotions:  Patient will label two or more emotions that they find the most difficult to experience:  Patient will be able to demonstrate positive conflict resolution skills through discussion or role plays:   Summary of Patient Progress: X   Therapeutic Modalities:   Cognitive Behavioral Therapy Feelings Identification Dialectical Behavioral Therapy   Glenis Smoker, LCSW

## 2022-12-28 NOTE — Progress Notes (Signed)
Patient given PRN medication to help with relief. Patient tolerated medication administration well, without any issues. Patient remains safe on the unit and will continue to be monitored.   12/28/22 0814  Pain Assessment  Pain Scale 0-10  Pain Score 0  Complaints & Interventions  Complains of Anxiety;Hallucinations;Other (Comment) (sleep disturbance)  Interventions Medication (see MAR)  Neuro symptoms relieved by Anti-anxiety medication

## 2022-12-28 NOTE — Group Note (Signed)
Date:  12/28/2022 Time:  9:59 AM  Group Topic/Focus:  Goals Group:   The focus of this group is to help patients establish daily goals to achieve during treatment and discuss how the patient can incorporate goal setting into their daily lives to aide in recovery.    Participation Level:  Did Not Attend   Lynelle Smoke [facility] 12/28/2022, 9:59 AM

## 2022-12-28 NOTE — Progress Notes (Signed)
   12/28/22 0830  Psych Admission Type (Psych Patients Only)  Admission Status Voluntary  Psychosocial Assessment  Patient Complaints Anxiety;Depression;Hopelessness;Insomnia;Sadness;Sleep disturbance (per patient this is due to "the voices and no rest".)  Eye Contact Fair;Watchful  Facial Expression Sullen  Affect Preoccupied  Speech Logical/coherent  Interaction Assertive  Motor Activity Slow  Appearance/Hygiene Layered clothes;In scrubs  Behavior Characteristics Cooperative;Appropriate to situation  Mood Sullen;Pleasant  Aggressive Behavior  Effect No apparent injury  Thought Process  Coherency WDL  Content WDL  Delusions None reported or observed  Perception Hallucinations  Hallucination Auditory  Judgment Impaired  Confusion None  Danger to Self  Current suicidal ideation? Denies (per patient, "not right now")  Self-Injurious Behavior No self-injurious ideation or behavior indicators observed or expressed   Agreement Not to Harm Self Yes  Description of Agreement Verbal  Danger to Others  Danger to Others None reported or observed

## 2022-12-28 NOTE — Progress Notes (Signed)
[facility] MD Progress Note  12/28/2022 1:30 PM [name]  MRN:  [id number] Subjective: [name] is seen on rounds.  His biggest complaint is still he cannot sleep and he looks very anxious and depressed.  I talked to him about Effexor and Restoril.  In the meantime I will get rid of some other medications that I do not think are helping.  He is able to contract for safety in the hospital.  His medical doctors took him off Seroquel because of his ventricular tachycardia.  Seems to be in rhythm right now.  Cardiac wise Restoril is probably the safest. Principal Problem: Bipolar disorder, current episode depressed, severe, with psychotic features (HCC) Diagnosis: Principal Problem:   Bipolar disorder, current episode depressed, severe, with psychotic features (HCC) Active Problems:   Essential hypertension   Atrial flutter (HCC)   MDD (major depressive disorder), recurrent episode, severe (HCC)   Mild tetrahydrocannabinol (THC) abuse  Total Time spent with patient: 15 minutes  Past Psychiatric History: Long history of depression  Past Medical History:  Past Medical History:  Diagnosis Date   Anxiety    Arthritis    knees and hands   Bipolar 1 disorder, depressed (HCC)    Bipolar disorder (HCC)    Depression    GERD (gastroesophageal reflux disease)    Hepatitis    HEP "C"   History of kidney stones    Hypertension    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 echo 5/17: EF 60-65%, no RWMA, LV dias fxn nl, LA mildly dilated, RV sys fxn nl, PASP nl, moderate sized circumferential pericardial effusion was identified, 2.12 cm around the LV free wall, <1 cm around the RV free wall. Features were not c/w tamponade physiology   PTSD (post-traumatic stress disorder)    Witnessed brother's suicide.   Restless leg syndrome    Seizures (HCC)    Syncope     Past Surgical History:  Procedure Laterality Date   CYSTOSCOPY WITH  URETEROSCOPY AND STENT PLACEMENT     ESOPHAGOGASTRODUODENOSCOPY N/A 01/11/2016   Procedure: ESOPHAGOGASTRODUODENOSCOPY (EGD);  Surgeon: Charlott Rakes, MD;  Location: [facility];  Service: Endoscopy;  Laterality: N/A;   ESOPHAGOGASTRODUODENOSCOPY N/A 04/09/2020   Procedure: ESOPHAGOGASTRODUODENOSCOPY (EGD);  Surgeon: [name], MD;  Location: [facility] END[facility]COPY;  Service: Gastroenterology;  Laterality: N/A;   INCISION AND DRAINAGE ABSCESS Left 01/02/2018   Procedure: INCISION AND DRAINAGE LEFT KNEE;  Surgeon: Deeann Saint, MD;  Location: [facility] ORS;  Service: Orthopedics;  Laterality: Left;   JOINT REPLACEMENT Right    TKR   KNEE ARTHROSCOPY Right 06/25/2014   Procedure: ARTHROSCOPY KNEE;  Surgeon: Deeann Saint, MD;  Location: [facility] ORS;  Service: Orthopedics;  Laterality: Right;  partial arthroscopic medial menisectomy   LAPAROSCOPIC APPENDECTOMY N/A 06/02/2021   Procedure: APPENDECTOMY LAPAROSCOPIC;  Surgeon: Campbell Lerner, MD;  Location: [facility] ORS;  Service: General;  Laterality: N/A;   TOTAL KNEE ARTHROPLASTY Right 04/22/2015   Procedure: TOTAL KNEE ARTHROPLASTY;  Surgeon: Deeann Saint, MD;  Location: [facility] OR[facility];  Service: Orthopedics;  Laterality: Right;   TOTAL KNEE ARTHROPLASTY Left 10/30/2017   Procedure: TOTAL KNEE ARTHROPLASTY;  Surgeon: Deeann Saint, MD;  Location: [facility] ORS;  Service: Orthopedics;  Laterality: Left;   TOTAL KNEE REVISION Left 01/02/2018   Procedure: poly exchange of tibia and patella left knee;  Surgeon: Deeann Saint, MD;  Location: [facility] OR[facility];  Service: Orthopedics;  Laterality: Left;   UMBILICAL HERNIA REPAIR  06/02/2021   Procedure: HERNIA REPAIR UMBILICAL ADULT;  Surgeon: Campbell Lerner, MD;  Location: [facility] ORS;  Service: General;;   Family History:  Family History  Problem Relation Age of Onset   CVA Mother        deceased at age 37   Depression Brother        Died by suicide at age 19   Family Psychiatric  History: Unremarkable Social History:   Social History   Substance and Sexual Activity  Alcohol Use Not Currently   Comment: rare     Social History   Substance and Sexual Activity  Drug Use Yes   Types: Marijuana, Cocaine   Comment: has not used recently    Social History   Socioeconomic History   Marital status: Single    Spouse name: Not on file   Number of children: Not on file   Years of education: Not on file   Highest education level: Not on file  Occupational History   Not on file  Tobacco Use   Smoking status: Former    Current packs/day: 0.00    Average packs/day: 0.8 packs/day for 20.0 years (15.0 ttl pk-yrs)    Types: Cigarettes    Start date: 05/16/1964    Quit date: 05/16/1984    Years since quitting: 38.6   Smokeless tobacco: Never  Vaping Use   Vaping status: Never Used  Substance and Sexual Activity   Alcohol use: Not Currently    Comment: rare   Drug use: Yes    Types: Marijuana, Cocaine    Comment: has not used recently   Sexual activity: Not Currently  Other Topics Concern   Not on file  Social History Narrative   ** Merged History Encounter **       Social Drivers of Health   Financial Resource Strain: Not on file  Food Insecurity: No Food Insecurity (12/23/2022)   Hunger Vital Sign    Worried About Running Out of Food in the Last Year: Never true    Ran Out of Food in the Last Year: Never true  Recent Concern: Food Insecurity - Food Insecurity Present (12/20/2022)   Hunger Vital Sign    Worried About Running Out of Food in the Last Year: Sometimes true    Ran Out of Food in the Last Year: Sometimes true  Transportation Needs: Unmet Transportation Needs (12/23/2022)   PRAPARE - Administrator, Civil Service (Medical): Yes    Lack of Transportation (Non-Medical): Yes  Physical Activity: Not on file  Stress: Not on file  Social Connections: Not on file   Additional Social History:                         Sleep: Poor  Appetite:  Poor  Current  Medications: Current Facility-Administered Medications  Medication Dose Route Frequency Provider Last Rate Last Admin   acetaminophen (TYLENOL) tablet 650 mg  650 mg Oral Q6H PRN Maryagnes Amos, FNP   650 mg at 12/28/22 0153   alum & mag hydroxide-simeth (MAALOX/MYLANTA) 200-200-20 MG/5ML suspension 30 mL  30 mL Oral Q4H PRN Maryagnes Amos, FNP       bismuth subsalicylate (PEPTO BISMOL) 262 MG/15ML suspension 30 mL  30 mL Oral Q4H PRN Myriam Forehand, NP   30 mL at 12/26/22 1120   haloperidol (HALDOL) tablet 5 mg  5 mg Oral TID PRN Maryagnes Amos, FNP   5 mg at 12/28/22  9147   And   diphenhydrAMINE (BENADRYL) capsule 50 mg  50 mg Oral TID PRN Maryagnes Amos, FNP   50 mg at 12/28/22 0814   divalproex (DEPAKOTE) DR tablet 500 mg  500 mg Oral BID Lewanda Rife, MD   500 mg at 12/28/22 8295   hydrALAZINE (APRESOLINE) tablet 25 mg  25 mg Oral Q6H PRN Esaw Grandchild A, DO       hydrOXYzine (ATARAX) tablet 25 mg  25 mg Oral Q6H PRN Myriam Forehand, NP   25 mg at 12/28/22 6213   lurasidone (LATUDA) tablet 40 mg  40 mg Oral Q supper Lewanda Rife, MD   40 mg at 12/27/22 1639   magnesium hydroxide (MILK OF MAGNESIA) suspension 30 mL  30 mL Oral Daily PRN Maryagnes Amos, FNP       metoprolol succinate (TOPROL-XL) 24 hr tablet 50 mg  50 mg Oral Daily Esaw Grandchild A, DO   50 mg at 12/28/22 0814   temazepam (RESTORIL) capsule 15 mg  15 mg Oral QHS Sarina Ill, DO        Lab Results: No results found for this or any previous visit (from the past 48 hours).  Blood Alcohol level:  Lab Results  Component Value Date   ETH <10 12/18/2022   ETH <10 09/15/2022    Metabolic Disorder Labs: Lab Results  Component Value Date   HGBA1C 5.5 04/16/2022   MPG 111 04/16/2022   MPG 105.41 01/18/2021   No results found for: "PROLACTIN" Lab Results  Component Value Date   CHOL 184 04/16/2022   TRIG 133 04/16/2022   HDL 32 (L) 04/16/2022   CHOLHDL 5.8  04/16/2022   VLDL 27 04/16/2022   LDLCALC 125 (H) 04/16/2022   LDLCALC 128 (H) 01/18/2021    Physical Findings: AIMS:  , ,  ,  ,    CIWA:    COWS:     Musculoskeletal: Strength & Muscle Tone: within normal limits Gait & Station: normal Patient leans: N/A  Psychiatric Specialty Exam:  Presentation  General Appearance:  Appropriate for Environment  Eye Contact: Fair  Speech: Clear and Coherent; Slow  Speech Volume: Decreased  Handedness: Right   Mood and Affect  Mood: Anxious; Depressed  Affect: Flat   Thought Process  Thought Processes: Coherent; Goal Directed; Linear  Descriptions of Associations:Intact  Orientation:Full (Time, Place and Person)  Thought Content:Logical  History of Schizophrenia/Schizoaffective disorder:No  Duration of Psychotic Symptoms:Greater than six months  Hallucinations:No data recorded Ideas of Reference:None  Suicidal Thoughts:No data recorded Homicidal Thoughts:No data recorded  Sensorium  Memory: Immediate Fair  Judgment: Intact  Insight: Fair   Art therapist  Concentration: Fair  Attention Span: Fair  Recall: [employer] of Knowledge: Fair  Language: Fair   Psychomotor Activity  Psychomotor Activity:No data recorded  Assets  Assets: Communication Skills; Housing   Sleep  Sleep:No data recorded    Blood pressure (!) 126/90, pulse 79, temperature 98.4 F (36.9 C), resp. rate 20, height 5\' 9"  (1.753 m), weight 81.2 kg, SpO2 97%. Body mass index is 26.43 kg/m.   Treatment Plan Summary: Daily contact with patient to assess and evaluate symptoms and progress in treatment, Medication management, and Plan start Effexor and Restoril and discontinue BuSpar and melatonin.  Lang Zingg Tresea Mall, DO 12/28/2022, 1:30 PM

## 2022-12-28 NOTE — Plan of Care (Signed)
  Problem: Education: Goal: Emotional status will improve Outcome: Progressing Goal: Mental status will improve Outcome: Progressing Goal: Verbalization of understanding the information provided will improve Outcome: Progressing   Problem: Activity: Goal: Interest or engagement in activities will improve Outcome: Progressing   Problem: Coping: Goal: Ability to verbalize frustrations and anger appropriately will improve Outcome: Progressing

## 2022-12-29 DIAGNOSIS — F315 Bipolar disorder, current episode depressed, severe, with psychotic features: Secondary | ICD-10-CM | POA: Diagnosis not present

## 2022-12-29 LAB — LIPID PANEL
Cholesterol: 208 mg/dL — ABNORMAL HIGH (ref 0–200)
HDL: 33 mg/dL — ABNORMAL LOW (ref >40)
LDL Cholesterol: 146 mg/dL — ABNORMAL HIGH (ref 0–99)
Total CHOL/HDL Ratio: 6.3 {ratio}
Triglycerides: 145 mg/dL (ref <150)
VLDL: 29 mg/dL (ref 0–40)

## 2022-12-29 LAB — HEMOGLOBIN A1C
Hgb A1c MFr Bld: 5.5 % (ref 4.8–5.6)
Mean Plasma Glucose: 111.15 mg/dL

## 2022-12-29 NOTE — Group Note (Signed)
Recreation Therapy Group Note   Group Topic:Relaxation  Group Date: 12/29/2022 Start Time: 1000 End Time: 1045 Facilitators: Rosina Lowenstein, LRT, CTRS Location:  Craft Room  Group Description: PMR (Progressive Muscle Relaxation). LRT asks patients their current level of stress/anxiety from 1-10, with 10 being the highest. LRT educates patients on what PMR is and the benefits that come from it. Patients are asked to sit with their feet flat on the floor while sitting up and all the way back in their chair, if possible. LRT and pts follow a prompt through a speaker that requires you to tense and release different muscles in their body and focus on their breathing. During session, lights are off and soft music is being played. Pts are given a stress ball to use if needed. At the end of the prompt, LRT asks patients to rank their current levels of stress/anxiety from 1-10, 10 being the highest. LRT provides patients with an education handout on PMR.   Goal Area(s) Addressed:  Patients will be able to describe progressive muscle relaxation.  Patient will practice using relaxation technique. Patient will identify a new coping skill.  Patient will follow multistep directions to reduce anxiety and stress.   Affect/Mood: N/A   Participation Level: Did not attend    Clinical Observations/Individualized Feedback: Patient did not attend group.   Plan: Continue to engage patient in RT group sessions 2-3x/week.   Rosina Lowenstein, LRT, CTRS 12/29/2022 11:46 AM

## 2022-12-29 NOTE — Group Note (Signed)
Date:  12/29/2022 Time:  3:47 PM  Group Topic/Focus:  Activity Group:  The focus of the group is to promote activity for the patients and encourage them to come out to the courtyard and get some exercise and fresh air.    Participation Level:  Did Not Attend   [name] 12/29/2022, 3:47 PM

## 2022-12-29 NOTE — Group Note (Signed)
Date:  12/29/2022 Time:  9:15 PM  Group Topic/Focus:  Identifying Needs:   The focus of this group is to help patients identify their personal needs that have been historically problematic and identify healthy behaviors to address their needs.    Participation Level:  Active  Participation Quality:  appropriate and attentive  Affect:  Appropriate  Cognitive:  Alert and Appropriate  Insight: Appropriate, Good, and Improving  Engagement in Group:  Developing/Improving and Engaged  Modes of Intervention:  Clarification, Discussion, Education, Rapport Building, and Support  Additional Comments:     [name] 12/29/2022, 9:15 PM

## 2022-12-29 NOTE — Group Note (Signed)
Date:  12/29/2022 Time:  10:51 AM  Group Topic/Focus:  Goals Group:   The focus of this group is to help patients establish daily goals to achieve during treatment and discuss how the patient can incorporate goal setting into their daily lives to aide in recovery.    Participation Level:  Did Not Attend   Lynelle Smoke [facility] 12/29/2022, 10:51 AM

## 2022-12-29 NOTE — Group Note (Signed)
Recreation Therapy Group Note   Group Topic:Coping Skills  Group Date: 12/29/2022 Start Time: 1530 End Time: 1620 Facilitators: Clinton Gallant, CTRS Location:  Craft Room  Group Description: Coping A-Z. LRT and patients engage in a guided discussion on what coping skills are and gave specific examples. LRT passed out a handout labeled Coping A-Z with blank spaces beside each letter. LRT prompted patients to come up with a coping skill for each of the letters. LRT and patients went over the handout and gave ideas for each letter if anyone had any blanks left on their paper. Patients kept this handout with them that listed 26 different coping skills.   Goal Area(s) Addressed: Patients will be able to define "coping skills". Patient will identify new coping skills.  Patient will increase communication.   Affect/Mood: N/A   Participation Level: Did not attend    Clinical Observations/Individualized Feedback: Patient did not attend group.   Plan: Continue to engage patient in RT group sessions 2-3x/week.   [name], LRT, CTRS 12/29/2022 5:20 PM

## 2022-12-29 NOTE — Plan of Care (Signed)
Patient rated his depression and anxiety 10/10. Patient verbalized that he is having passive suicidal ideation.Patient contracts for safety. Patient continues to hear voices states " to kill myself." Patient c/o diarrhea. Encouraged fluids and PRN medications given. Patient isolates to room. Compliant with medications. Encouraged for groups and ADLs.

## 2022-12-29 NOTE — Progress Notes (Signed)
[facility] MD Progress Note  12/29/2022 7:47 AM [name]  MRN:  [id number]  Subjective:   Notes, labs, and vital signs reviewed; progression meeting attended. Today, the client returned from breakfast and reported, "I'm ok."  Moderate depression, "It's still there", no suicidal ideations.  Anxiety is "better", no panic attacks.  His sleep was fair, "off and on".  Appetite is fair as he experienced an upset stomach.  Denies side effects from his medications.  Discussed discharge tomorrow, client agreeable with follow up with [facility].  Principal Problem: Bipolar disorder, current episode depressed, severe, with psychotic features (HCC) Diagnosis: Principal Problem:   Bipolar disorder, current episode depressed, severe, with psychotic features (HCC) Active Problems:   Essential hypertension   Atrial flutter (HCC)   Mild tetrahydrocannabinol (THC) abuse  Total Time spent with patient: 30 minutes  Past Psychiatric History: depression, bipolar d/o, anxiety  Past Medical History:  Past Medical History:  Diagnosis Date   Anxiety    Arthritis    knees and hands   Bipolar 1 disorder, depressed (HCC)    Bipolar disorder (HCC)    Depression    GERD (gastroesophageal reflux disease)    Hepatitis    HEP "C"   History of kidney stones    Hypertension    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 echo 5/17: EF 60-65%, no RWMA, LV dias fxn nl, LA mildly dilated, RV sys fxn nl, PASP nl, moderate sized circumferential pericardial effusion was identified, 2.12 cm around the LV free wall, <1 cm around the RV free wall. Features were not c/w tamponade physiology   PTSD (post-traumatic stress disorder)    Witnessed brother's suicide.   Restless leg syndrome    Seizures (HCC)    Syncope     Past Surgical History:  Procedure Laterality Date   CYSTOSCOPY WITH URETEROSCOPY AND STENT PLACEMENT     ESOPHAGOGASTRODUODENOSCOPY N/A 01/11/2016    Procedure: ESOPHAGOGASTRODUODENOSCOPY (EGD);  Surgeon: Charlott Rakes, MD;  Location: [facility] ENDOSCOPY;  Service: Endoscopy;  Laterality: N/A;   ESOPHAGOGASTRODUODENOSCOPY N/A 04/09/2020   Procedure: ESOPHAGOGASTRODUODENOSCOPY (EGD);  Surgeon: [name], MD;  Location: [facility] ENDOSCOPY;  Service: Gastroenterology;  Laterality: N/A;   INCISION AND DRAINAGE ABSCESS Left 01/02/2018   Procedure: INCISION AND DRAINAGE LEFT KNEE;  Surgeon: Deeann Saint, MD;  Location: [facility] ORS;  Service: Orthopedics;  Laterality: Left;   JOINT REPLACEMENT Right    TKR   KNEE ARTHROSCOPY Right 06/25/2014   Procedure: ARTHROSCOPY KNEE;  Surgeon: Deeann Saint, MD;  Location: [facility] ORS;  Service: Orthopedics;  Laterality: Right;  partial arthroscopic medial menisectomy   LAPAROSCOPIC APPENDECTOMY N/A 06/02/2021   Procedure: APPENDECTOMY LAPAROSCOPIC;  Surgeon: Campbell Lerner, MD;  Location: [facility] ORS;  Service: General;  Laterality: N/A;   TOTAL KNEE ARTHROPLASTY Right 04/22/2015   Procedure: TOTAL KNEE ARTHROPLASTY;  Surgeon: Deeann Saint, MD;  Location: [facility] ORS;  Service: Orthopedics;  Laterality: Right;   TOTAL KNEE ARTHROPLASTY Left 10/30/2017   Procedure: TOTAL KNEE ARTHROPLASTY;  Surgeon: Deeann Saint, MD;  Location: [facility] ORS;  Service: Orthopedics;  Laterality: Left;   TOTAL KNEE REVISION Left 01/02/2018   Procedure: poly exchange of tibia and patella left knee;  Surgeon: Deeann Saint, MD;  Location: [facility] ORS;  Service: Orthopedics;  Laterality: Left;   UMBILICAL HERNIA REPAIR  06/02/2021   Procedure: HERNIA REPAIR UMBILICAL ADULT;  Surgeon: Campbell Lerner, MD;  Location: [facility] ORS;  Service: General;;   Family  History:  Family History  Problem Relation Age of Onset   CVA Mother        deceased at age 38   Depression Brother        Died by suicide at age 27   Family Psychiatric  History: see above Social History:  Social History   Substance and Sexual Activity  Alcohol Use Not Currently   Comment:  rare     Social History   Substance and Sexual Activity  Drug Use Yes   Types: Marijuana, Cocaine   Comment: has not used recently    Social History   Socioeconomic History   Marital status: Single    Spouse name: Not on file   Number of children: Not on file   Years of education: Not on file   Highest education level: Not on file  Occupational History   Not on file  Tobacco Use   Smoking status: Former    Current packs/day: 0.00    Average packs/day: 0.8 packs/day for 20.0 years (15.0 ttl pk-yrs)    Types: Cigarettes    Start date: 05/16/1964    Quit date: 05/16/1984    Years since quitting: 38.6   Smokeless tobacco: Never  Vaping Use   Vaping status: Never Used  Substance and Sexual Activity   Alcohol use: Not Currently    Comment: rare   Drug use: Yes    Types: Marijuana, Cocaine    Comment: has not used recently   Sexual activity: Not Currently  Other Topics Concern   Not on file  Social History Narrative   ** Merged History Encounter **       Social Drivers of Corporate investment banker Strain: Not on file  Food Insecurity: No Food Insecurity (12/23/2022)   Hunger Vital Sign    Worried About Running Out of Food in the Last Year: Never true    Ran Out of Food in the Last Year: Never true  Recent Concern: Food Insecurity - Food Insecurity Present (12/20/2022)   Hunger Vital Sign    Worried About Running Out of Food in the Last Year: Sometimes true    Ran Out of Food in the Last Year: Sometimes true  Transportation Needs: Unmet Transportation Needs (12/23/2022)   PRAPARE - Administrator, Civil Service (Medical): Yes    Lack of Transportation (Non-Medical): Yes  Physical Activity: Not on file  Stress: Not on file  Social Connections: Not on file   Additional Social History:  has a place to live        Sleep: Fair  Appetite:  Fair  Current Medications: Current Facility-Administered Medications  Medication Dose Route Frequency Provider  Last Rate Last Admin   acetaminophen (TYLENOL) tablet 650 mg  650 mg Oral Q6H PRN Maryagnes Amos, FNP   650 mg at 12/28/22 2113   alum & mag hydroxide-simeth (MAALOX/MYLANTA) 200-200-20 MG/5ML suspension 30 mL  30 mL Oral Q4H PRN Maryagnes Amos, FNP       bismuth subsalicylate (PEPTO BISMOL) 262 MG/15ML suspension 30 mL  30 mL Oral Q4H PRN Myriam Forehand, NP   30 mL at 12/28/22 1423   haloperidol (HALDOL) tablet 5 mg  5 mg Oral TID PRN Maryagnes Amos, FNP   5 mg at 12/28/22 0814   And   diphenhydrAMINE (BENADRYL) capsule 50 mg  50 mg Oral TID PRN Maryagnes Amos, FNP   50 mg at 12/28/22 0814   divalproex (DEPAKOTE) DR  tablet 500 mg  500 mg Oral BID Lewanda Rife, MD   500 mg at 12/28/22 1625   hydrALAZINE (APRESOLINE) tablet 25 mg  25 mg Oral Q6H PRN Esaw Grandchild A, DO       hydrOXYzine (ATARAX) tablet 25 mg  25 mg Oral Q6H PRN Myriam Forehand, NP   25 mg at 12/28/22 1625   lurasidone (LATUDA) tablet 40 mg  40 mg Oral Q supper Lewanda Rife, MD   40 mg at 12/28/22 1625   magnesium hydroxide (MILK OF MAGNESIA) suspension 30 mL  30 mL Oral Daily PRN Maryagnes Amos, FNP       metoprolol succinate (TOPROL-XL) 24 hr tablet 50 mg  50 mg Oral Daily Esaw Grandchild A, DO   50 mg at 12/28/22 0814   temazepam (RESTORIL) capsule 15 mg  15 mg Oral QHS Sarina Ill, DO   15 mg at 12/28/22 2130   venlafaxine XR (EFFEXOR-XR) 24 hr capsule 75 mg  75 mg Oral QPC breakfast Sarina Ill, DO   75 mg at 12/28/22 1422    Lab Results: No results found for this or any previous visit (from the past 48 hours).  Blood Alcohol level:  Lab Results  Component Value Date   ETH <10 12/18/2022   ETH <10 09/15/2022    Metabolic Disorder Labs: Lab Results  Component Value Date   HGBA1C 5.5 04/16/2022   MPG 111 04/16/2022   MPG 105.41 01/18/2021   No results found for: "PROLACTIN" Lab Results  Component Value Date   CHOL 184 04/16/2022   TRIG 133  04/16/2022   HDL 32 (L) 04/16/2022   CHOLHDL 5.8 04/16/2022   VLDL 27 04/16/2022   LDLCALC 125 (H) 04/16/2022   LDLCALC 128 (H) 01/18/2021    Physical Findings: AIMS:  , ,  ,  ,    CIWA:    COWS:     Musculoskeletal: Strength & Muscle Tone: within normal limits Gait & Station: normal Patient leans: N/A  Psychiatric Specialty Exam: Physical Exam Vitals and nursing note reviewed.  Constitutional:      Appearance: Normal appearance.  HENT:     Head: Normocephalic.     Nose: Nose normal.  Pulmonary:     Effort: Pulmonary effort is normal.  Musculoskeletal:        General: Normal range of motion.     Cervical back: Normal range of motion.  Neurological:     General: No focal deficit present.     Mental Status: He is alert and oriented to person, place, and time.     Review of Systems  Psychiatric/Behavioral:  Positive for depression. The patient is nervous/anxious.   All other systems reviewed and are negative.   Blood pressure (!) 142/100, pulse 92, temperature 98.6 F (37 C), temperature source Oral, resp. rate 20, height 5\' 9"  (1.753 m), weight 81.2 kg, SpO2 96%.Body mass index is 26.43 kg/m.  General Appearance: Casual  Eye Contact:  Good  Speech:  Normal Rate  Volume:  Normal  Mood:  Anxious and Depressed  Affect:  Blunt  Thought Process:  Coherent  Orientation:  Full (Time, Place, and Person)  Thought Content:  Logical  Suicidal Thoughts:  No  Homicidal Thoughts:  No  Memory:  Immediate;   Good Recent;   Good Remote;   Good  Judgement:  Good  Insight:  Good  Psychomotor Activity:  Normal  Concentration:  Concentration: Good and Attention Span: Good  Recall:  Good  Fund of Knowledge:  Fair  Language:  Good  Akathisia:  No  Handed:  Right  AIMS (if indicated):     Assets:  Housing Leisure Time Physical Health Resilience  ADL's:  Intact  Cognition:  WNL  Sleep:        Physical Exam: Physical Exam Vitals and nursing note reviewed.   Constitutional:      Appearance: Normal appearance.  HENT:     Head: Normocephalic.     Nose: Nose normal.  Pulmonary:     Effort: Pulmonary effort is normal.  Musculoskeletal:        General: Normal range of motion.     Cervical back: Normal range of motion.  Neurological:     General: No focal deficit present.     Mental Status: He is alert and oriented to person, place, and time.    Review of Systems  Psychiatric/Behavioral:  Positive for depression. The patient is nervous/anxious.   All other systems reviewed and are negative.  Blood pressure (!) 142/100, pulse 92, temperature 98.6 F (37 C), temperature source Oral, resp. rate 20, height 5\' 9"  (1.753 m), weight 81.2 kg, SpO2 96%. Body mass index is 26.43 kg/m.   Treatment Plan Summary: Daily contact with patient to assess and evaluate symptoms and progress in treatment, Medication management, and Plan : Bipolar affective disorder, depressed, severe without psychosis: Depakote 500 mg BID Effexor 75 mg daily Latuda 40 mg daily  Insomnia: Restoril 15 mg daily at bedtime  General anxiety disorder: Hydroxyzine 25 mg every six hours PRN   Nanine Means, NP 12/29/2022, 7:47 AM

## 2022-12-30 DIAGNOSIS — F315 Bipolar disorder, current episode depressed, severe, with psychotic features: Secondary | ICD-10-CM | POA: Diagnosis not present

## 2022-12-30 MED ORDER — LURASIDONE HCL 40 MG PO TABS: 40.0000 mg | ORAL_TABLET | Freq: Every day | ORAL | 0 refills | Status: DC

## 2022-12-30 MED ORDER — METOPROLOL SUCCINATE ER 50 MG PO TB24: 50.0000 mg | ORAL_TABLET | Freq: Every day | ORAL | 0 refills | Status: DC

## 2022-12-30 MED ORDER — TEMAZEPAM 15 MG PO CAPS: 15.0000 mg | ORAL_CAPSULE | Freq: Every day | ORAL | 0 refills | Status: DC

## 2022-12-30 MED ORDER — VENLAFAXINE HCL ER 75 MG PO CP24: 75.0000 mg | ORAL_CAPSULE | Freq: Every day | ORAL | 0 refills | Status: DC

## 2022-12-30 MED ORDER — DIVALPROEX SODIUM 500 MG PO DR TAB: 500.0000 mg | DELAYED_RELEASE_TABLET | Freq: Two times a day (BID) | ORAL | 0 refills | Status: DC

## 2022-12-30 NOTE — [facility] Suicide Risk Assessment (Signed)
[facility] Discharge Suicide Risk Assessment   Principal Problem: Bipolar disorder, current episode depressed, severe, with psychotic features (HCC) Discharge Diagnoses: Principal Problem:   Bipolar disorder, current episode depressed, severe, with psychotic features (HCC) Active Problems:   Essential hypertension   Atrial flutter (HCC)   Mild tetrahydrocannabinol (THC) abuse   Total Time spent with patient: 45 minutes  Musculoskeletal: Strength & Muscle Tone: within normal limits Gait & Station: normal Patient leans: N/A   Psychiatric Specialty Exam: Physical Exam Vitals and nursing note reviewed.  Constitutional:      Appearance: Normal appearance.  HENT:     Head: Normocephalic.     Nose: Nose normal.  Pulmonary:     Effort: Pulmonary effort is normal.  Musculoskeletal:        General: Normal range of motion.     Cervical back: Normal range of motion.  Neurological:     General: No focal deficit present.     Mental Status: He is alert and oriented to person, place, and time.     Review of Systems  Psychiatric/Behavioral:  Positive for depression. The patient is nervous/anxious.   All other systems reviewed and are negative.   Blood pressure 127/87, pulse 93, temperature 97.7 F (36.5 C), resp. rate 16, height 5\' 9"  (1.753 m), weight 81.2 kg, SpO2 96%.Body mass index is 26.43 kg/m.  General Appearance: Casual  Eye Contact:  Good  Speech:  Normal Rate  Volume:  Normal  Mood:  Anxious and Depressed, mild  Affect:  Blunt  Thought Process:  Coherent  Orientation:  Full (Time, Place, and Person)  Thought Content:  WDL and Logical  Suicidal Thoughts:  No  Homicidal Thoughts:  No  Memory:  Immediate;   Good Recent;   Good Remote;   Good  Judgement:  Good  Insight:  Fair  Psychomotor Activity:  Normal  Concentration:  Concentration: Good and Attention Span: Good  Recall:  Good  Fund of Knowledge:  Good  Language:  Good  Akathisia:  No  Handed:  Right  AIMS (if  indicated):     Assets:  Housing Leisure Time Resilience Social Support  ADL's:  Intact  Cognition:  WNL  Sleep:     Physical Exam: Physical Exam Vitals and nursing note reviewed.  Constitutional:      Appearance: Normal appearance.  HENT:     Head: Normocephalic.     Nose: Nose normal.  Pulmonary:     Effort: Pulmonary effort is normal.  Musculoskeletal:        General: Normal range of motion.     Cervical back: Normal range of motion.  Neurological:     General: No focal deficit present.     Mental Status: He is alert and oriented to person, place, and time.    Review of Systems  Psychiatric/Behavioral:  Positive for depression. The patient is nervous/anxious.   All other systems reviewed and are negative.  Blood pressure 127/87, pulse 93, temperature 97.7 F (36.5 C), resp. rate 16, height 5\' 9"  (1.753 m), weight 81.2 kg, SpO2 96%. Body mass index is 26.43 kg/m.  Mental Status Per Nursing Assessment::   On Admission:  NA  Demographic Factors:  Male and Caucasian  Loss Factors: NA  Historical Factors: NA  Risk Reduction Factors:   Living with another person, especially a relative, Positive social support, and Positive therapeutic relationship  Continued Clinical Symptoms:  Depression and anxiety, mild  Cognitive Features That Contribute To Risk:  None  Suicide Risk:  Minimal: No identifiable suicidal ideation.  Patients presenting with no risk factors but with morbid ruminations; may be classified as minimal risk based on the severity of the depressive symptoms   Follow-up Information     [facility]. Go to.   Why: In person appointment scheduled for 01/09/23 at 11 AM. Contact information: [address] [place] [address] [phone]                 Plan Of Care/Follow-up recommendations:  Activity:  as tolerated Diet:  heart healthy diet Bipolar affective disorder, depressed, severe without  psychosis: Depakote 500 mg BID Effexor 75 mg daily Latuda 40 mg daily   Insomnia: Restoril 15 mg daily at bedtime   General anxiety disorder: Hydroxyzine 25 mg every six hours PRN   Nanine Means, NP 12/30/2022, 10:05 AM

## 2022-12-30 NOTE — Plan of Care (Signed)
  Problem: Education: Goal: Knowledge of [facility] Education information/materials will improve Outcome: Progressing Goal: Emotional status will improve Outcome: Progressing   Problem: Activity: Goal: Interest or engagement in activities will improve Outcome: Progressing Goal: Sleeping patterns will improve Outcome: Progressing

## 2022-12-30 NOTE — Group Note (Signed)
Date:  12/30/2022 Time:  10:13 AM  Group Topic/Focus:  Relapse Prevention Planning:   The focus of this group is to define relapse and discuss the need for planning to combat relapse.    Participation Level:  Did Not Attend   Rosaura Carpenter 12/30/2022, 10:13 AM

## 2022-12-30 NOTE — Discharge Summary (Signed)
Physician Discharge Summary Note  Patient:  [name] is an 62 y.o., male MRN:  [id number] DOB:  01/08/1961 Patient phone:  [phone] (home)  Patient address:   [address] [place] [address],  Total Time spent with patient: 45 minutes  Date of Admission:  12/23/2022 Date of Discharge: 12/30/2022  Reason for Admission:  suicidal ideations  Principal Problem: Bipolar disorder, current episode depressed, severe, with psychotic features [name] - [place]) Discharge Diagnoses: Principal Problem:   Bipolar disorder, current episode depressed, severe, with psychotic features (HCC) Active Problems:   Essential hypertension   Atrial flutter (HCC)   Mild tetrahydrocannabinol (THC) abuse   Past Psychiatric History: depression, bipolar d/o  Past Medical History:  Past Medical History:  Diagnosis Date   Anxiety    Arthritis    knees and hands   Bipolar 1 disorder, depressed (HCC)    Bipolar disorder (HCC)    Depression    GERD (gastroesophageal reflux disease)    Hepatitis    HEP "C"   History of kidney stones    Hypertension    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 echo 5/17: EF 60-65%, no RWMA, LV dias fxn nl, LA mildly dilated, RV sys fxn nl, PASP nl, moderate sized circumferential pericardial effusion was identified, 2.12 cm around the LV free wall, <1 cm around the RV free wall. Features were not c/w tamponade physiology   PTSD (post-traumatic stress disorder)    Witnessed brother's suicide.   Restless leg syndrome    Seizures (HCC)    Syncope     Past Surgical History:  Procedure Laterality Date   CYSTOSCOPY WITH URETEROSCOPY AND STENT PLACEMENT     ESOPHAGOGASTRODUODENOSCOPY N/A 01/11/2016   Procedure: ESOPHAGOGASTRODUODENOSCOPY (EGD);  Surgeon: Charlott Rakes, MD;  Location: [facility] ENDOSCOPY;  Service: Endoscopy;  Laterality: N/A;   ESOPHAGOGASTRODUODENOSCOPY N/A 04/09/2020   Procedure:  ESOPHAGOGASTRODUODENOSCOPY (EGD);  Surgeon: [name], MD;  Location: [facility] ENDOSCOPY;  Service: Gastroenterology;  Laterality: N/A;   INCISION AND DRAINAGE ABSCESS Left 01/02/2018   Procedure: INCISION AND DRAINAGE LEFT KNEE;  Surgeon: Deeann Saint, MD;  Location: [facility] ORS;  Service: Orthopedics;  Laterality: Left;   JOINT REPLACEMENT Right    TKR   KNEE ARTHROSCOPY Right 06/25/2014   Procedure: ARTHROSCOPY KNEE;  Surgeon: Deeann Saint, MD;  Location: [facility] ORS;  Service: Orthopedics;  Laterality: Right;  partial arthroscopic medial menisectomy   LAPAROSCOPIC APPENDECTOMY N/A 06/02/2021   Procedure: APPENDECTOMY LAPAROSCOPIC;  Surgeon: Campbell Lerner, MD;  Location: [facility] ORS;  Service: General;  Laterality: N/A;   TOTAL KNEE ARTHROPLASTY Right 04/22/2015   Procedure: TOTAL KNEE ARTHROPLASTY;  Surgeon: Deeann Saint, MD;  Location: [facility] ORS;  Service: Orthopedics;  Laterality: Right;   TOTAL KNEE ARTHROPLASTY Left 10/30/2017   Procedure: TOTAL KNEE ARTHROPLASTY;  Surgeon: Deeann Saint, MD;  Location: [facility] ORS;  Service: Orthopedics;  Laterality: Left;   TOTAL KNEE REVISION Left 01/02/2018   Procedure: poly exchange of tibia and patella left knee;  Surgeon: Deeann Saint, MD;  Location: [facility] ORS;  Service: Orthopedics;  Laterality: Left;   UMBILICAL HERNIA REPAIR  06/02/2021   Procedure: HERNIA REPAIR UMBILICAL ADULT;  Surgeon: Campbell Lerner, MD;  Location: [facility] ORS;  Service: General;;   Family History:  Family History  Problem Relation Age of Onset   CVA Mother        deceased at age 84   Depression Brother  Died by suicide at age 21   Family Psychiatric  History: see [email or web address] Social History:  Social History   Substance and Sexual Activity  Alcohol Use Not Currently   Comment: rare     Social History   Substance and Sexual Activity  Drug Use Yes   Types: Marijuana, Cocaine   Comment: has not used recently    Social History   Socioeconomic History   Marital  status: Single    Spouse name: Not on file   Number of children: Not on file   Years of education: Not on file   Highest education level: Not on file  Occupational History   Not on file  Tobacco Use   Smoking status: Former    Current packs/day: 0.00    Average packs/day: 0.8 packs/day for 20.0 years (15.0 ttl pk-yrs)    Types: Cigarettes    Start date: 05/16/1964    Quit date: 05/16/1984    Years since quitting: 38.6   Smokeless tobacco: Never  Vaping Use   Vaping status: Never Used  Substance and Sexual Activity   Alcohol use: Not Currently    Comment: rare   Drug use: Yes    Types: Marijuana, Cocaine    Comment: has not used recently   Sexual activity: Not Currently  Other Topics Concern   Not on file  Social History Narrative   ** Merged History Encounter **       Social Drivers of Health   Financial Resource Strain: Not on file  Food Insecurity: No Food Insecurity (12/23/2022)   Hunger Vital Sign    Worried About Running Out of Food in the Last Year: Never true    Ran Out of Food in the Last Year: Never true  Recent Concern: Food Insecurity - Food Insecurity Present (12/20/2022)   Hunger Vital Sign    Worried About Running Out of Food in the Last Year: Sometimes true    Ran Out of Food in the Last Year: Sometimes true  Transportation Needs: Unmet Transportation Needs (12/23/2022)   PRAPARE - Administrator, Civil Service (Medical): Yes    Lack of Transportation (Non-Medical): Yes  Physical Activity: Not on file  Stress: Not on file  Social Connections: Not on file    Hospital Course:   62 yo male admitted for suicidal ideations with a plan, history of bipolar d/o.  Medications started and adjusted along with therapy.  He had a medical admission related to his SVT and treated, returned to the [facility] for mental care.  [name] stabilized with a low level of depression and anxiety, no suicidal/homicidal ideations, hallucinations, or substance abuse.  He has a  home to return to live and will follow up with [facility].  [name] has met maximum capacity of hospitalization.  Discharge instructions provided along with explanations and Rx, crisis numbers, and follow up appointment information.  Musculoskeletal: Strength & Muscle Tone: within normal limits Gait & Station: normal Patient leans: N/A   Psychiatric Specialty Exam: Physical Exam Vitals and nursing note reviewed.  Constitutional:      Appearance: Normal appearance.  HENT:     Head: Normocephalic.     Nose: Nose normal.  Pulmonary:     Effort: Pulmonary effort is normal.  Musculoskeletal:        General: Normal range of motion.     Cervical back: Normal range of motion.  Neurological:     General: No focal deficit present.     Mental Status:  He is alert and oriented to person, place, and time.     Review of Systems  Psychiatric/Behavioral:  Positive for depression. The patient is nervous/anxious.   All other systems reviewed and are negative.   Blood pressure 127/87, pulse 93, temperature 97.7 F (36.5 C), resp. rate 16, height 5\' 9"  (1.753 m), weight 81.2 kg, SpO2 96%.Body mass index is 26.43 kg/m.  General Appearance: Casual  Eye Contact:  Good  Speech:  Normal Rate  Volume:  Normal  Mood:  Anxious and Depressed, mild  Affect:  Blunt  Thought Process:  Coherent  Orientation:  Full (Time, Place, and Person)  Thought Content:  WDL and Logical  Suicidal Thoughts:  No  Homicidal Thoughts:  No  Memory:  Immediate;   Good Recent;   Good Remote;   Good  Judgement:  Good  Insight:  Fair  Psychomotor Activity:  Normal  Concentration:  Concentration: Good and Attention Span: Good  Recall:  Good  Fund of Knowledge:  Good  Language:  Good  Akathisia:  No  Handed:  Right  AIMS (if indicated):     Assets:  Housing Leisure Time Resilience Social Support  ADL's:  Intact  Cognition:  WNL  Sleep:         Physical Exam: Physical Exam Vitals and nursing note reviewed.   Constitutional:      Appearance: Normal appearance.  HENT:     Head: Normocephalic.     Nose: Nose normal.  Pulmonary:     Effort: Pulmonary effort is normal.  Musculoskeletal:        General: Normal range of motion.     Cervical back: Normal range of motion.  Neurological:     General: No focal deficit present.     Mental Status: He is alert and oriented to person, place, and time.    Review of Systems  Psychiatric/Behavioral:  Positive for depression. The patient is nervous/anxious.   All other systems reviewed and are negative.  Blood pressure 127/87, pulse 93, temperature 97.7 F (36.5 C), resp. rate 16, height 5\' 9"  (1.753 m), weight 81.2 kg, SpO2 96%. Body mass index is 26.43 kg/m.   Social History   Tobacco Use  Smoking Status Former   Current packs/day: 0.00   Average packs/day: 0.8 packs/day for 20.0 years (15.0 ttl pk-yrs)   Types: Cigarettes   Start date: 05/16/1964   Quit date: 05/16/1984   Years since quitting: 38.6  Smokeless Tobacco Never   Tobacco Cessation:  A prescription for an FDA-approved tobacco cessation medication was offered at discharge and the patient refused   Blood Alcohol level:  Lab Results  Component Value Date   [facility] <10 12/18/2022   ETH <10 09/15/2022    Metabolic Disorder Labs:  Lab Results  Component Value Date   HGBA1C 5.5 12/29/2022   MPG 111.15 12/29/2022   MPG 111 04/16/2022   No results found for: "PROLACTIN" Lab Results  Component Value Date   CHOL 208 (H) 12/29/2022   TRIG 145 12/29/2022   HDL 33 (L) 12/29/2022   CHOLHDL 6.3 12/29/2022   VLDL 29 12/29/2022   LDLCALC 146 (H) 12/29/2022   LDLCALC 125 (H) 04/16/2022    See Psychiatric Specialty Exam and Suicide Risk Assessment completed by Attending Physician prior to discharge.  Discharge destination:  Home  Is patient on multiple antipsychotic therapies at discharge:  No   Has Patient had three or more failed trials of antipsychotic monotherapy by history:   No  Recommended  Plan for Multiple Antipsychotic Therapies: NA  Discharge Instructions     Diet - low sodium heart healthy   Complete by: As directed    Discharge instructions   Complete by: As directed    Follow up with [facility]   Increase activity slowly   Complete by: As directed       Allergies as of 12/30/2022   No Known Allergies      Medication List     TAKE these medications      Indication  divalproex 500 MG DR tablet Commonly known as: DEPAKOTE Take 1 tablet (500 mg total) by mouth 2 (two) times daily.  Indication: Manic Phase of Manic-Depression   lurasidone 40 MG Tabs tablet Commonly known as: LATUDA Take 1 tablet (40 mg total) by mouth daily with supper.  Indication: MIXED BIPOLAR AFFECTIVE DISORDER   metoprolol succinate 50 MG 24 hr tablet Commonly known as: TOPROL-XL Take 1 tablet (50 mg total) by mouth daily. Start taking on: December 31, 2022  Indication: High Blood Pressure   tamsulosin 0.4 MG Caps capsule Commonly known as: FLOMAX Take 1 capsule (0.4 mg total) by mouth daily after supper.  Indication: Benign Enlargement of Prostate   temazepam 15 MG capsule Commonly known as: RESTORIL Take 1 capsule (15 mg total) by mouth at bedtime.  Indication: Trouble Sleeping   traMADol 50 MG tablet Commonly known as: ULTRAM Take 50 mg by mouth every 6 (six) hours as needed.  Indication: Pain   venlafaxine XR 75 MG 24 hr capsule Commonly known as: EFFEXOR-XR Take 1 capsule (75 mg total) by mouth daily after breakfast. Start taking on: December 31, 2022  Indication: Major Depressive Disorder        Follow-up Information     [facility], [facility]. Go to.   Why: In person appointment scheduled for 01/09/23 at 11 AM. Contact information: [address] [place] [address] [phone]                 Follow-up recommendations:  Activity:  as tolerated Diet:  heart healthy diet Bipolar affective disorder, depressed,  severe without psychosis: Depakote 500 mg BID Effexor 75 mg daily Latuda 40 mg daily   Insomnia: Restoril 15 mg daily at bedtime   General anxiety disorder: Hydroxyzine 25 mg every six hours PRN   Comments:  follow up with [facility]  Signed: Nanine Means, NP 12/30/2022, 10:02 AM

## 2022-12-30 NOTE — Progress Notes (Signed)
Patient pleasant and cooperative on approach. Denies SI,HI and AVH. Verbalized understanding discharge instructions,prescriptions and follow up care.  All belongings returned from BMU locker. Suicide safety plan filled by patient and placed in chart. Copy given to patient.Patient escorted out by staff and transported by cab. 

## 2022-12-30 NOTE — Progress Notes (Signed)
  [facility] Adult Case Management Discharge Plan :  Will you be returning to the same living situation after discharge:  Yes,  Patient to return home.  At discharge, do you have transportation home?: Yes,  CSW to assist with transportation.  Do you have the ability to pay for your medications: Yes, [employer]   Release of information consent forms completed and in the chart;  Patient's signature needed at discharge.  Patient to Follow up at:  Follow-up Information     [facility], [facility]. Go to.   Why: In person appointment scheduled for 01/09/23 at 11 AM. Contact information: [address] [place] [address] [phone]                 Next level of care provider has access to [facility] Link:no  Safety Planning and Suicide Prevention discussed: No. Patient refused. SPE material given at discharge.      Has patient been referred to the Quitline?: Patient does not use tobacco/nicotine products  Patient has been referred for addiction treatment: Yes, the patient will follow up with an outpatient provider for substance use disorder. Therapist: appointment made  [name], LCSW 12/30/2022, 9:45 AM

## 2022-12-30 NOTE — Progress Notes (Signed)
   12/30/22 0300  Psych Admission Type (Psych Patients Only)  Admission Status Voluntary  Psychosocial Assessment  Patient Complaints Anxiety  Eye Contact Fair  Facial Expression Anxious  Affect Preoccupied  Speech Logical/coherent  Interaction Assertive  Motor Activity Slow  Appearance/Hygiene Unremarkable  Behavior Characteristics Cooperative  Mood Sullen  Thought Process  Coherency WDL  Content WDL  Delusions None reported or observed  Perception WDL  Hallucination None reported or observed  Judgment Impaired  Confusion None  Danger to Self  Current suicidal ideation? Denies  Danger to Others  Danger to Others None reported or observed

## 2023-02-12 ENCOUNTER — Inpatient Hospital Stay
Admission: EM | Admit: 2023-02-12 | Discharge: 2023-02-15 | DRG: 558 | Disposition: A | Discharge status: Other Acute Inpatient Hospital | Payer: MEDICAID | Attending: Internal Medicine | Admitting: Internal Medicine

## 2023-02-12 ENCOUNTER — Other Ambulatory Visit: Payer: Self-pay

## 2023-02-12 DIAGNOSIS — R440 Auditory hallucinations: Secondary | ICD-10-CM | POA: Diagnosis present

## 2023-02-12 DIAGNOSIS — F1210 Cannabis abuse, uncomplicated: Secondary | ICD-10-CM | POA: Diagnosis present

## 2023-02-12 DIAGNOSIS — I10 Essential (primary) hypertension: Secondary | ICD-10-CM | POA: Diagnosis present

## 2023-02-12 DIAGNOSIS — Z765 Malingerer [conscious simulation]: Secondary | ICD-10-CM | POA: Diagnosis not present

## 2023-02-12 DIAGNOSIS — E7220 Disorder of urea cycle metabolism, unspecified: Secondary | ICD-10-CM | POA: Diagnosis present

## 2023-02-12 DIAGNOSIS — F32A Depression, unspecified: Secondary | ICD-10-CM

## 2023-02-12 DIAGNOSIS — Z818 Family history of other mental and behavioral disorders: Secondary | ICD-10-CM

## 2023-02-12 DIAGNOSIS — F4310 Post-traumatic stress disorder, unspecified: Secondary | ICD-10-CM | POA: Diagnosis not present

## 2023-02-12 DIAGNOSIS — M170 Bilateral primary osteoarthritis of knee: Secondary | ICD-10-CM | POA: Diagnosis present

## 2023-02-12 DIAGNOSIS — R748 Abnormal levels of other serum enzymes: Secondary | ICD-10-CM | POA: Diagnosis present

## 2023-02-12 DIAGNOSIS — F609 Personality disorder, unspecified: Secondary | ICD-10-CM | POA: Diagnosis present

## 2023-02-12 DIAGNOSIS — F1910 Other psychoactive substance abuse, uncomplicated: Secondary | ICD-10-CM

## 2023-02-12 DIAGNOSIS — F1010 Alcohol abuse, uncomplicated: Secondary | ICD-10-CM | POA: Diagnosis present

## 2023-02-12 DIAGNOSIS — F6089 Other specific personality disorders: Secondary | ICD-10-CM

## 2023-02-12 DIAGNOSIS — F419 Anxiety disorder, unspecified: Secondary | ICD-10-CM | POA: Diagnosis present

## 2023-02-12 DIAGNOSIS — R569 Unspecified convulsions: Secondary | ICD-10-CM

## 2023-02-12 DIAGNOSIS — G2581 Restless legs syndrome: Secondary | ICD-10-CM | POA: Diagnosis present

## 2023-02-12 DIAGNOSIS — F1490 Cocaine use, unspecified, uncomplicated: Secondary | ICD-10-CM | POA: Diagnosis not present

## 2023-02-12 DIAGNOSIS — F1410 Cocaine abuse, uncomplicated: Secondary | ICD-10-CM | POA: Diagnosis present

## 2023-02-12 DIAGNOSIS — K219 Gastro-esophageal reflux disease without esophagitis: Secondary | ICD-10-CM | POA: Diagnosis present

## 2023-02-12 DIAGNOSIS — Z79899 Other long term (current) drug therapy: Secondary | ICD-10-CM | POA: Diagnosis not present

## 2023-02-12 DIAGNOSIS — R000 Tachycardia, unspecified: Secondary | ICD-10-CM

## 2023-02-12 DIAGNOSIS — Z96653 Presence of artificial knee joint, bilateral: Secondary | ICD-10-CM | POA: Diagnosis present

## 2023-02-12 DIAGNOSIS — R109 Unspecified abdominal pain: Secondary | ICD-10-CM | POA: Diagnosis present

## 2023-02-12 DIAGNOSIS — M19041 Primary osteoarthritis, right hand: Secondary | ICD-10-CM | POA: Diagnosis present

## 2023-02-12 DIAGNOSIS — Z87891 Personal history of nicotine dependence: Secondary | ICD-10-CM

## 2023-02-12 DIAGNOSIS — F331 Major depressive disorder, recurrent, moderate: Secondary | ICD-10-CM | POA: Diagnosis present

## 2023-02-12 DIAGNOSIS — G40909 Epilepsy, unspecified, not intractable, without status epilepticus: Secondary | ICD-10-CM | POA: Diagnosis present

## 2023-02-12 DIAGNOSIS — Z8619 Personal history of other infectious and parasitic diseases: Secondary | ICD-10-CM

## 2023-02-12 DIAGNOSIS — Z87442 Personal history of urinary calculi: Secondary | ICD-10-CM

## 2023-02-12 DIAGNOSIS — R45851 Suicidal ideations: Secondary | ICD-10-CM

## 2023-02-12 DIAGNOSIS — Z91148 Patient's other noncompliance with medication regimen for other reason: Secondary | ICD-10-CM

## 2023-02-12 DIAGNOSIS — F315 Bipolar disorder, current episode depressed, severe, with psychotic features: Secondary | ICD-10-CM | POA: Diagnosis not present

## 2023-02-12 DIAGNOSIS — M6282 Rhabdomyolysis: Secondary | ICD-10-CM | POA: Diagnosis present

## 2023-02-12 DIAGNOSIS — G8929 Other chronic pain: Secondary | ICD-10-CM | POA: Diagnosis present

## 2023-02-12 DIAGNOSIS — Z9152 Personal history of nonsuicidal self-harm: Secondary | ICD-10-CM

## 2023-02-12 DIAGNOSIS — F1310 Sedative, hypnotic or anxiolytic abuse, uncomplicated: Secondary | ICD-10-CM | POA: Diagnosis present

## 2023-02-12 DIAGNOSIS — R7401 Elevation of levels of liver transaminase levels: Secondary | ICD-10-CM | POA: Diagnosis present

## 2023-02-12 DIAGNOSIS — Z9151 Personal history of suicidal behavior: Secondary | ICD-10-CM

## 2023-02-12 DIAGNOSIS — M19042 Primary osteoarthritis, left hand: Secondary | ICD-10-CM | POA: Diagnosis present

## 2023-02-12 DIAGNOSIS — Z634 Disappearance and death of family member: Secondary | ICD-10-CM

## 2023-02-12 LAB — COMPREHENSIVE METABOLIC PANEL
ALT: 61 U/L — ABNORMAL HIGH (ref 0–44)
AST: 230 U/L — ABNORMAL HIGH (ref 15–41)
Albumin: 4.3 g/dL (ref 3.5–5.0)
Alkaline Phosphatase: 59 U/L (ref 38–126)
Anion gap: 16 — ABNORMAL HIGH (ref 5–15)
BUN: 29 mg/dL — ABNORMAL HIGH (ref 6–20)
CO2: 17 mmol/L — ABNORMAL LOW (ref 22–32)
Calcium: 9.8 mg/dL (ref 8.9–10.3)
Chloride: 102 mmol/L (ref 98–111)
Creatinine, Ser: 1.04 mg/dL (ref 0.61–1.24)
GFR, Estimated: >60 mL/min (ref >60)
Glucose, Bld: 105 mg/dL — ABNORMAL HIGH (ref 70–99)
Potassium: 4.4 mmol/L (ref 3.5–5.1)
Sodium: 135 mmol/L (ref 135–145)
Total Bilirubin: 2.1 mg/dL — ABNORMAL HIGH (ref 0.0–1.2)
Total Protein: 7.8 g/dL (ref 6.5–8.1)

## 2023-02-12 LAB — CBC
HCT: 44.6 % (ref 39.0–52.0)
Hemoglobin: 15.2 g/dL (ref 13.0–17.0)
MCH: 30 pg (ref 26.0–34.0)
MCHC: 34.1 g/dL (ref 30.0–36.0)
MCV: 88.1 fL (ref 80.0–100.0)
Platelets: 216 10*3/uL (ref 150–400)
RBC: 5.06 MIL/uL (ref 4.22–5.81)
RDW: 14.4 % (ref 11.5–15.5)
WBC: 10.1 10*3/uL (ref 4.0–10.5)
nRBC: 0 % (ref 0.0–0.2)

## 2023-02-12 LAB — CK: Total CK: 17322 U/L — ABNORMAL HIGH (ref 49–397)

## 2023-02-12 LAB — SALICYLATE LEVEL: Salicylate Lvl: <7 mg/dL — ABNORMAL LOW (ref 7.0–30.0)

## 2023-02-12 LAB — ETHANOL: Alcohol, Ethyl (B): <10 mg/dL (ref <10)

## 2023-02-12 LAB — VALPROIC ACID LEVEL: Valproic Acid Lvl: <10 ug/mL — ABNORMAL LOW (ref 50.0–100.0)

## 2023-02-12 LAB — ACETAMINOPHEN LEVEL: Acetaminophen (Tylenol), Serum: <10 ug/mL — ABNORMAL LOW (ref 10–30)

## 2023-02-12 MED ORDER — DIVALPROEX SODIUM 500 MG PO DR TAB
500.0000 mg | DELAYED_RELEASE_TABLET | Freq: Once | ORAL | Status: AC
  Administered 2023-02-12: 500 mg via ORAL
  Filled 2023-02-12: qty 1

## 2023-02-12 MED ORDER — LACTATED RINGERS IV BOLUS
2000.0000 mL | Freq: Once | INTRAVENOUS | Status: AC
  Administered 2023-02-12: 2000 mL via INTRAVENOUS

## 2023-02-12 MED ORDER — DIVALPROEX SODIUM 500 MG PO DR TAB
500.0000 mg | DELAYED_RELEASE_TABLET | Freq: Two times a day (BID) | ORAL | Status: DC
  Administered 2023-02-12 – 2023-02-15 (×6): 500 mg via ORAL
  Filled 2023-02-12 (×6): qty 1

## 2023-02-12 NOTE — ED Triage Notes (Signed)
Pt to ED for suicidal thoughts. Takes Depakote for seizures, states had 4 seizures last night and then ever since then he has been thinking of "hanging myself". Denies ETOH withdrawel as cause for seizures. States used cocaine last week. States having L knee pain and back pain since seizures last night, bilateral, whole back upper and lower. Pt is ambulatory.

## 2023-02-12 NOTE — Consult Note (Signed)
[facility] Psychiatric Consult Initial  Patient Name: .[name]  MRN: [id number]  DOB: 1960-09-27  Consult Order details:  Orders (From admission, onward)     Start     Ordered   02/12/23 2129  CONSULT TO CALL ACT TEAM       Ordering Provider: Delton Prairie, MD  Provider:  (Not yet assigned)  Question:  Reason for Consult?  Answer:  Psych consult   02/12/23 2128   02/12/23 2129  IP CONSULT TO PSYCHIATRY       Ordering Provider: Delton Prairie, MD  Provider:  (Not yet assigned)  Question Answer Comment  Place call to: psych   Reason for Consult Admit      02/12/23 2128             Mode of Visit: Tele-visit Virtual Statement:TELE PSYCHIATRY ATTESTATION & CONSENT As the provider for this telehealth consult, I attest that I verified the patient's identity using two separate identifiers, introduced myself to the patient, provided my credentials, disclosed my location, and performed this encounter via a HIPAA-compliant, real-time, face-to-face, two-way, interactive audio and video platform and with the full consent and agreement of the patient (or guardian as applicable.) Patient physical location: [facility] ER. Telehealth provider physical location: home office in state of [place].   Video start time:   Video end time:      Psychiatry Consult Evaluation  Service Date: February 12, 2023 LOS:  LOS: 0 days  Chief Complaint Seizures and SI  Primary Psychiatric Diagnoses  Suicidal ideation Alcohol abuse Cluster B personality disorder [facility]) Malingering  Assessment  [name] is a 63 y.o. male admitted: Presented to the ED on 02/12/2023  6:31 PM for suicidal thoughts. He carries the psychiatric diagnoses of Cluster B personality disorder, PTSD and  depression.    His current presentation of suicidal thoughts  is most consistent with depression and PTSD. He meets criteria for inpatient based on suicidal ideation.  Current outpatient psychotropic medications include depakote, latuda,  restoril and effexor and historically he has had a poor response to these medications. He was non compliant with medications prior to admission as evidenced by mental health decline. On initial examination, patient was cooperative and engaging. Please see plan below for detailed recommendations.   Diagnoses:  Active Hospital problems: Principal Problem:   Suicidal ideation Active Problems:   Alcohol abuse   Cluster B personality disorder (HCC)   Malingering    Plan   ## Psychiatric Medication Recommendations:  Continue medications as prescribed on the inpatient unit  ## Medical Decision Making Capacity: Not specifically addressed in this encounter  ## Further Work-up:  -[name] was admitted to [facility] ER  Suicidal ideation, crisis management, and stabilization. Routine labs ordered, which include  Lab Orders         Comprehensive metabolic panel         Ethanol         Salicylate level         Acetaminophen level         cbc         Urine Drug Screen, Qualitative         Valproic acid level         CK    Medication Management: Medications started . divalproex  500 mg Oral Q12H   Will maintain observation checks every 15 minutes for safety. Psychosocial education regarding relapse prevention and self-care; social and communication  Social work will consult with family  for collateral information and discuss discharge and follow up plan.   ## Disposition:-- We recommend inpatient psychiatric hospitalization when medically cleared. Patient is under voluntary admission status at this time; please IVC if attempts to leave hospital.  ## Behavioral / Environmental: - Patients with borderline personality traits/disorder often use the language of physical pain to communicate both physical and emotional suffering. It is important to address pain complaints as they arise and attempt to identify an etiology, either organic or psychiatric. In patients with chronic pain, it is  important to have a discussion with the patient about expectations about pain control. or To minimize splitting of staff, assign one staff person to communicate all information from the team when feasible.    ## Safety and Observation Level:  - Based on my clinical evaluation, I estimate the patient to be at moderate  risk of self harm in the current setting. - At this time, we recommend  routine. This decision is based on my review of the chart including patient's history and current presentation, interview of the patient, mental status examination, and consideration of suicide risk including evaluating suicidal ideation, plan, intent, suicidal or self-harm behaviors, risk factors, and protective factors. This judgment is based on our ability to directly address suicide risk, implement suicide prevention strategies, and develop a safety plan while the patient is in the clinical setting. Please contact our team if there is a concern that risk level has changed.  CSSR Risk Category:C-SSRS RISK CATEGORY: High Risk  Suicide Risk Assessment: Patient has following modifiable risk factors for suicide: active suicidal ideation, under treated depression , social isolation, recklessness, and medication noncompliance, which we are addressing by recommending inpatient hospitalization. Patient has following non-modifiable or demographic risk factors for suicide: male gender, history of suicide attempt, history of self harm behavior, and psychiatric hospitalization Patient has the following protective factors against suicide: Frustration tolerance  Thank you for this consult request. Recommendations have been communicated to the primary team.  We will recommend inpatient at this time.   Jearld Lesch, NP       History of Present Illness  Relevant Aspects of Hospital ED Course:  Admitted on 02/12/2023 for suicidal thoughts.   Patient Report:  Patient states that he was suicidal.  He says he had a couple of  seizure and he was put ina a bad place.  He says he's not sleeping or eating.  He says he is having PTSD and is having flash back.  He says he takes Depakote for the seizures.  He say he hasn't slept for 2 days.  He says his thoughts of suicide last night.  He endorsed AH.  He says he hears the voice of his brother that passed away, wanting him to join him.  He says he "sees things" sometimes.  He says he lives alone. He denies a psychiatrist or therapist.  States he ran out of meds last week. He says he went to  [facility] and they stated they were full, so he gave up. He admits to smoking crack last week.   Psych ROS:  Depression: yes  Anxiety:  yes   Review of Systems  Psychiatric/Behavioral:  Positive for depression, hallucinations, substance abuse and suicidal ideas. The patient is nervous/anxious and has insomnia.   All other systems reviewed and are negative.    Psychiatric and Social History  Psychiatric History:  Information collected from patient and chart review  Prev Dx/Sx: depression PTSD Current Psych Provider: none Home Meds (  current): not taking any Previous Med Trials: latuda, Depakote Therapy: denies  Prior Psych Hospitalization: yes (4 in the last 6 months)   Substance History Alcohol: denies  Illicit drugs: cocaine (4 days ago)   Exam Findings  Physical Exam:  Vital Signs:  Temp:  [97.5 F (36.4 C)-97.8 F (36.6 C)] 97.8 F (36.6 C) (02/02 2011) Pulse Rate:  [86-92] 86 (02/02 2011) Resp:  [16] 16 (02/02 2011) BP: (94-99)/(67-74) 94/67 (02/02 2011) SpO2:  [95 %-97 %] 95 % (02/02 2011) Weight:  [74.8 kg] 74.8 kg (02/02 1649) Blood pressure 94/67, pulse 86, temperature 97.8 F (36.6 C), temperature source Oral, resp. rate 16, height 5\' 9"  (1.753 m), weight 74.8 kg, SpO2 95%. Body mass index is 24.37 kg/m.  Physical Exam Vitals and nursing note reviewed.  HENT:     Head: Normocephalic and atraumatic.     Nose: Nose normal.     Mouth/Throat:     Mouth:  Mucous membranes are moist.  Eyes:     Pupils: Pupils are equal, round, and reactive to light.  Pulmonary:     Effort: Pulmonary effort is normal.  Musculoskeletal:        General: Normal range of motion.     Cervical back: Normal range of motion.  Skin:    General: Skin is warm.  Neurological:     Mental Status: He is alert and oriented to person, place, and time.  Psychiatric:        Attention and Perception: Attention and perception normal.        Mood and Affect: Affect normal. Mood is anxious.        Speech: Speech normal.        Behavior: Behavior normal. Behavior is cooperative.        Thought Content: Thought content includes suicidal ideation. Thought content does not include homicidal ideation. Thought content includes suicidal plan. Thought content does not include homicidal plan.        Cognition and Memory: Cognition and memory normal.        Judgment: Judgment is impulsive.     Mental Status Exam: General Appearance: Casual  Orientation:  Full (Time, Place, and Person)  Memory:  Immediate;   Good Recent;   Good  Concentration:  Concentration: Good and Attention Span: Good  Recall:  Fair  Attention  Good  Eye Contact:  Good  Speech:  Clear and Coherent  Language:  Good  Volume:  Normal  Mood: depressed  Affect:  Congruent  Thought Process:  Coherent  Thought Content:  Hallucinations: Auditory  Suicidal Thoughts:  Yes.  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  Family History:  The patient's family history includes CVA in his mother; Depression in his brother.  Medical History: Past  Medical History:  Diagnosis Date  . Anxiety   . Arthritis    knees and hands  . Bipolar 1 disorder, depressed (HCC)   . Bipolar disorder (HCC)   . Depression   . GERD (gastroesophageal reflux disease)   . Hepatitis    HEP "C"  .  History of kidney stones   . Hypertension   . 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 Kidney stones   . Pericarditis 05/2015   a. echo 5/17: EF 60-65%, no RWMA, LV dias fxn nl, LA mildly dilated, RV sys fxn nl, PASP nl, moderate sized circumferential pericardial effusion was identified, 2.12 cm around the LV free wall, <1 cm around the RV free wall. Features were not c/w tamponade physiology  . PTSD (post-traumatic stress disorder)    Witnessed brother's suicide.  Marland Kitchen Restless leg syndrome   . Seizures (HCC)   . Syncope     Surgical History: Past Surgical History:  Procedure Laterality Date  . APPENDECTOMY    . CYSTOSCOPY WITH URETEROSCOPY AND STENT PLACEMENT    . ESOPHAGOGASTRODUODENOSCOPY N/A 01/11/2016   Procedure: ESOPHAGOGASTRODUODENOSCOPY (EGD);  Surgeon: Charlott Rakes, MD;  Location: [facility] ENDOSCOPY;  Service: Endoscopy;  Laterality: N/A;  . ESOPHAGOGASTRODUODENOSCOPY N/A 04/09/2020   Procedure: ESOPHAGOGASTRODUODENOSCOPY (EGD);  Surgeon: [name], MD;  Location: [facility] ENDOSCOPY;  Service: Gastroenterology;  Laterality: N/A;  . INCISION AND DRAINAGE ABSCESS Left 01/02/2018   Procedure: INCISION AND DRAINAGE LEFT KNEE;  Surgeon: Deeann Saint, MD;  Location: [facility] ORS;  Service: Orthopedics;  Laterality: Left;  . JOINT REPLACEMENT Right    TKR  . KNEE ARTHROSCOPY Right 06/25/2014   Procedure: ARTHROSCOPY KNEE;  Surgeon: Deeann Saint, MD;  Location: [facility] ORS;  Service: Orthopedics;  Laterality: Right;  partial arthroscopic medial menisectomy  . LAPAROSCOPIC APPENDECTOMY N/A 06/02/2021   Procedure: APPENDECTOMY LAPAROSCOPIC;  Surgeon: Campbell Lerner, MD;  Location: [facility] ORS;  Service: General;  Laterality: N/A;  . TOTAL  KNEE ARTHROPLASTY Right 04/22/2015   Procedure: TOTAL KNEE ARTHROPLASTY;  Surgeon: Deeann Saint, MD;  Location: [facility] ORS;  Service: Orthopedics;  Laterality: Right;  . TOTAL KNEE ARTHROPLASTY Left 10/30/2017   Procedure: TOTAL KNEE ARTHROPLASTY;  Surgeon: Deeann Saint, MD;  Location: [facility] ORS;  Service: Orthopedics;  Laterality: Left;  . TOTAL KNEE REVISION Left 01/02/2018   Procedure: poly exchange of tibia and patella left knee;  Surgeon: Deeann Saint, MD;  Location: [facility] ORS;  Service: Orthopedics;  Laterality: Left;  . UMBILICAL HERNIA REPAIR  06/02/2021   Procedure: HERNIA REPAIR UMBILICAL ADULT;  Surgeon: Campbell Lerner, MD;  Location: [facility] ORS;  Service: General;;     Medications:   Current Facility-Administered Medications:  .  divalproex (DEPAKOTE) DR tablet 500 mg, 500 mg, Oral, Q12H, Delton Prairie, MD, 500 mg at 02/12/23 2011  Current Outpatient Medications:  .  divalproex (DEPAKOTE) 500 MG DR tablet, Take 1 tablet (500 mg total) by mouth 2 (two) times daily., Disp: 60 tablet, Rfl: 0 .  lurasidone (LATUDA) 40 MG TABS tablet, Take 1 tablet (40 mg total) by mouth daily with supper., Disp: 30 tablet, Rfl: 0 .  metoprolol succinate (TOPROL-XL) 50 MG 24 hr tablet, Take 1 tablet (50 mg total) by mouth daily., Disp: 30 tablet, Rfl: 0 .  tamsulosin (FLOMAX) 0.4 MG CAPS capsule, Take 1 capsule (0.4 mg total) by mouth daily after supper., Disp: , Rfl:  .  temazepam (RESTORIL) 15 MG capsule, Take 1 capsule (15 mg total) by mouth at bedtime., Disp: 30 capsule, Rfl: 0 .  traMADol (ULTRAM) 50 MG tablet, Take 50 mg by mouth every 6 (six) hours as needed., Disp: , Rfl:  .  venlafaxine XR (EFFEXOR-XR) 75 MG 24 hr capsule, Take 1 capsule (75 mg total) by mouth daily after breakfast., Disp: 30 capsule, Rfl: 0  Allergies: No Known  Allergies  Daemien Fronczak Damaris Hippo, NP

## 2023-02-12 NOTE — BH Assessment (Signed)
Comprehensive Clinical Assessment (CCA) Note  02/12/2023 [name] [id number] Recommendations for Services/Supports/Treatments: Psych NP Rashaun D. determined pt. meets psychiatric inpatient criteria. [name] is a 63 y.o., Caucasian, Not Hispanic or Latino ethnicity, ENGLISH speaking male with psych hx of Bipolar Disorder.  Per triage note: Pt to ED for suicidal thoughts. Takes Depakote for seizures, states had 4 seizures last night and then ever since then he has been thinking of "hanging myself". Denies ETOH withdrawal as cause for seizures. States used cocaine last week.  Pt was lying awake upon this writer's arrival. Pt presented with linear, relevant thoughts. Motor behavior and speech were normal. Pt did not appear to be responding internal stimuli. Pt reported that he sometimes hears voices of deceased family members and sees things. Pt made normal eye contact. Pt's mood was appropriate and pt. was cooperative with the assessment process. Pt admitted to having worsening thoughts of SI with a plan to hang himself; however, pt. expressed a desire for help because he's too afraid to carry out the plan. Pt expressed grievances about [facility] and their inadequate patient care. Pt reported that he is experiencing PTSD flashbacks, does not sleep much, and is not eating. Pt admitted to cocaine use one week ago. Pt denied substance abuse treatment. The pt. had fair insight and was able to identify accurately why he'd presented to the emergency department. Pt stated he feels like "I need to get back on the right medications because the other ones just didn't work." Pt reported that he does not receive any services; however, he has been taking his medications. Pt reported that he has ran out of medication. Pt reports living in a room at this time. The patient denied current HI/VH.  Chief Complaint:  Chief Complaint  Patient presents with   Suicidal   Seizures   Visit Diagnosis: Suicidal  ideation Alcohol abuse Cluster B personality disorder (HCC) Malingering  CCA Screening, Triage and Referral (STR)  Patient Reported Information How did you hear about [place]? Self  Referral name: No data recorded Referral phone number: No data recorded  Whom do you see for routine medical problems? No data recorded Practice/Facility Name: No data recorded Practice/Facility Phone Number: No data recorded Name of Contact: No data recorded Contact Number: No data recorded Contact Fax Number: No data recorded Prescriber Name: No data recorded Prescriber Address (if known): No data recorded  What Is the Reason for Your Visit/Call Today? Suicidal thoughts  How Long Has This Been Causing You Problems? > than 6 months  What Do You Feel Would Help You the Most Today? Treatment for Depression or other mood problem; Medication(s)   Have You Recently Been in Any Inpatient Treatment (Hospital/Detox/Crisis Center/28-Day Program)? No data recorded Name/Location of Program/Hospital:No data recorded How Long Were You There? No data recorded When Were You Discharged? No data recorded  Have You Ever Received Services From [facility] Before? No data recorded Who Do You See at [facility]? No data recorded  Have You Recently Had Any Thoughts About Hurting Yourself? Yes  Are You Planning to Commit Suicide/Harm Yourself At This time? No   Have you Recently Had Thoughts About Hurting Someone [name]? No  Explanation: No data recorded  Have You Used Any Alcohol or Drugs in the Past 24 Hours? No  How Long Ago Did You Use Drugs or Alcohol? No data recorded What Did You Use and How Much? Marijuana   Do You Currently Have a Therapist/Psychiatrist? No  Name of  Therapist/Psychiatrist: [facility]   Have You Been Recently Discharged From Any Public relations account executive or Programs? No  Explanation of Discharge From Practice/Program: No data recorded    CCA Screening Triage Referral Assessment Type of Contact:  Face-to-Face  Is this Initial or Reassessment? No data recorded Date Telepsych consult ordered in [facility]:  No data recorded Time Telepsych consult ordered in [facility]:  No data recorded  Patient Reported Information Reviewed? No data recorded Patient Left Without Being Seen? No data recorded Reason for Not Completing Assessment: No data recorded  Collateral Involvement: None provided   Does Patient Have a Court Appointed Legal Guardian? No data recorded Name and Contact of Legal Guardian: No data recorded If Minor and Not Living with Parent(s), Who has Custody? No data recorded Is CPS involved or ever been involved? Never  Is APS involved or ever been involved? Never   Patient Determined To Be At Risk for Harm To Self or Others Based on Review of Patient Reported Information or Presenting Complaint? Yes, for Self-Harm  Method: Plan without intent  Availability of Means: No access or NA  Intent: Vague intent or NA  Notification Required: No need or identified person  Additional Information for Danger to Others Potential: No data recorded Additional Comments for Danger to Others Potential: No data recorded Are There Guns or Other Weapons in Your Home? No  Types of Guns/Weapons: No data recorded Are These Weapons Safely Secured?                            No  Who Could Verify You Are Able To Have These Secured: No data recorded Do You Have any Outstanding Charges, Pending Court Dates, Parole/Probation? No data recorded Contacted To Inform of Risk of Harm To Self or Others: No data recorded  Location of Assessment: [facility] ED   Does Patient Present under Involuntary Commitment? Yes  IVC Papers Initial File Date: No data recorded  [place] of Residence:    Patient Currently Receiving the Following Services: Individual Therapy   Determination of Need: Emergent (2 hours)   Options For Referral: ED Visit; Inpatient Hospitalization; Medication Management     CCA  Biopsychosocial Intake/Chief Complaint:  No data recorded Current Symptoms/Problems: No data recorded  Patient Reported Schizophrenia/Schizoaffective Diagnosis in Past: No   Strengths: Some insight, polite and seeking help.  Preferences: No data recorded Abilities: No data recorded  Type of Services Patient Feels are Needed: No data recorded  Initial Clinical Notes/Concerns: No data recorded  Mental Health Symptoms Depression:  Change in energy/activity; Difficulty Concentrating; Sleep (too much or little)   Duration of Depressive symptoms: Greater than two weeks   Mania:  N/A   Anxiety:   Difficulty concentrating; Sleep   Psychosis:  None   Duration of Psychotic symptoms: Greater than six months   Trauma:  N/A   Obsessions:  N/A   Compulsions:  N/A   Inattention:  N/A   Hyperactivity/Impulsivity:  N/A   Oppositional/Defiant Behaviors:  N/A   Emotional Irregularity:  N/A   Other Mood/Personality Symptoms:  Pt high self harm risk    Mental Status Exam Appearance and self-care  Stature:  Average   Weight:  Average weight   Clothing:  Casual   Grooming:  Normal   Cosmetic use:  None   Posture/gait:  Normal   Motor activity:  Not Remarkable   Sensorium  Attention:  Normal   Concentration:  Normal   Orientation:  X5  Recall/memory:  Normal   Affect and Mood  Affect:  Depressed; Full Range; Appropriate; Anxious   Mood:  Depressed; Anxious   Relating  Eye contact:  Normal   Facial expression:  Depressed; Responsive; Anxious   Attitude toward examiner:  Cooperative   Thought and Language  Speech flow: Clear and Coherent; Normal   Thought content:  Appropriate to Mood and Circumstances   Preoccupation:  None   Hallucinations:  None   Organization:  No data recorded  [employer] of Knowledge:  Fair   Intelligence:  Average   Abstraction:  Normal   Judgement:  Normal   Reality TestingAnimator; Adequate    Insight:  Poor   Decision Making:  Normal   Social Functioning  Social Maturity:  Responsible   Social Judgement:  Normal   Stress  Stressors:  Transitions   Coping Ability:  Normal   Skill Deficits:  None   Supports:  Family     Religion:    Leisure/Recreation:    Exercise/Diet:     CCA Employment/Education Employment/Work Situation:    Education:     CCA Family/Childhood History Family and Relationship History:    Childhood History:     Child/Adolescent Assessment:     CCA Substance Use Alcohol/Drug Use:                           ASAM's:  Six Dimensions of Multidimensional Assessment  Dimension 1:  Acute Intoxication and/or Withdrawal Potential:      Dimension 2:  Biomedical Conditions and Complications:      Dimension 3:  Emotional, Behavioral, or Cognitive Conditions and Complications:     Dimension 4:  Readiness to Change:     Dimension 5:  Relapse, Continued use, or Continued Problem Potential:     Dimension 6:  Recovery/Living Environment:     ASAM Severity Score:    ASAM Recommended Level of Treatment:     Substance use Disorder (SUD)    Recommendations for Services/Supports/Treatments:    DSM5 Diagnoses: Patient Active Problem List   Diagnosis Date Noted   Mild tetrahydrocannabinol (THC) abuse 12/24/2022   Bipolar disorder, current episode depressed, severe, with psychotic features (HCC) 12/24/2022   Atrial flutter (HCC) 12/21/2022   SVT (supraventricular tachycardia) (HCC) 12/20/2022   PTSD (post-traumatic stress disorder) 09/16/2022   Suicidal ideation 09/15/2022   Malingering 06/28/2021   Cocaine use    Cluster B personality disorder (HCC) 05/03/2021   Essential hypertension 01/18/2021   Alcohol abuse 01/18/2021   Coronary artery disease of native artery of native heart with stable angina pectoris [facility])     Patient Centered Plan: Patient is on the following Treatment Plan(s):  Depression and Post  Traumatic Stress Disorder  Harly Pipkins R Matthe Sloane, LCAS

## 2023-02-12 NOTE — ED Notes (Signed)
Pt belongings bag 1/1: Black sneakers, white socks, black cargo pants, grey boxers, grey short sleeve shirt. Dark blue sweatshirt, brown leather wallet, black cellphone, black reading glasses

## 2023-02-12 NOTE — ED Notes (Signed)
 Pt given food tray.

## 2023-02-12 NOTE — H&P (Signed)
History and Physical    Patient: [name] ZOX:[id number] DOB: Jan 07, 1961 DOA: 02/12/2023 DOS: the patient was seen and examined on 02/12/2023 PCP: [name], PA  Patient coming from: {Point_of_Origin:[address]}  Chief Complaint:  Chief Complaint  Patient presents with   Suicidal   Seizures   HPI: [name] is a 63 y.o. 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 echo 5/17: EF 60-65%, no RWMA, LV dias fxn nl, LA mildly dilated, RV sys fxn nl, PASP nl, moderate sized circumferential pericardial effusion was identified, 2.12 cm around the LV free wall, <1 cm around the RV free wall. Features were not c/w tamponade physiology   PTSD (post-traumatic stress disorder)    Witnessed brother's suicide.   Restless leg syndrome    Seizures (HCC)    Syncope    Past Surgical History:  Procedure Laterality Date   APPENDECTOMY     CYSTOSCOPY WITH URETEROSCOPY AND STENT PLACEMENT     ESOPHAGOGASTRODUODENOSCOPY N/A 01/11/2016   Procedure: ESOPHAGOGASTRODUODENOSCOPY (EGD);  Surgeon: Charlott Rakes, MD;  Location: [facility] ENDOSCOPY;  Service: Endoscopy;  Laterality: N/A;   ESOPHAGOGASTRODUODENOSCOPY N/A 04/09/2020   Procedure: ESOPHAGOGASTRODUODENOSCOPY (EGD);  Surgeon: [name], MD;  Location[facility] ENDOSCOPY;  Service: Gastroenterology;  Laterality: N/A;   INCISION AND DRAINAGE ABSCESS Left 01/02/2018   Procedure: INCISION AND DRAINAGE LEFT KNEE;  Surgeon: Deeann Saint, MD;  Location: [facility] ORS;  Service: Orthopedics;  Laterality: Left;   JOINT REPLACEMENT Right     TKR   KNEE ARTHROSCOPY Right 06/25/2014   Procedure: ARTHROSCOPY KNEE;  Surgeon: Deeann Saint, MD;  Location: [facility] ORS;  Service: Orthopedics;  Laterality: Right;  partial arthroscopic medial menisectomy   LAPAROSCOPIC APPENDECTOMY N/A 06/02/2021   Procedure: APPENDECTOMY LAPAROSCOPIC;  Surgeon: Campbell Lerner, MD;  Location: [facility] ORS;  Service: General;  Laterality: N/A;   TOTAL KNEE ARTHROPLASTY Right 04/22/2015   Procedure: TOTAL KNEE ARTHROPLASTY;  Surgeon: Deeann Saint, MD;  Location: [facility] ORS;  Service: Orthopedics;  Laterality: Right;   TOTAL KNEE ARTHROPLASTY Left 10/30/2017   Procedure: TOTAL KNEE ARTHROPLASTY;  Surgeon: Deeann Saint, MD;  Location: [facility] ORS;  Service: Orthopedics;  Laterality: Left;   TOTAL KNEE REVISION Left 01/02/2018   Procedure: poly exchange of tibia and patella left knee;  Surgeon: Deeann Saint, MD;  Location: [facility] ORS;  Service: Orthopedics;  Laterality: Left;   UMBILICAL HERNIA REPAIR  06/02/2021   Procedure: HERNIA REPAIR UMBILICAL ADULT;  Surgeon: Campbell Lerner, MD;  Location: [facility] ORS;  Service: General;;   Social History:  reports that he quit smoking about 38 years ago. His smoking use included cigarettes. He started smoking about 58 years ago. He has a 15 pack-year smoking history. He has never used smokeless tobacco. He reports that he does not currently use alcohol. He reports current drug use. Drugs: Marijuana and Cocaine.  No Known Allergies  Family History  Problem Relation Age of Onset   CVA Mother        deceased at age  47   Depression Brother        Died by suicide at age 45    Prior to Admission medications   Medication Sig Start Date End Date Taking? Authorizing Provider  divalproex (DEPAKOTE) 500 MG DR tablet Take 1 tablet (500 mg total) by mouth 2 (two) times daily. 12/30/22 01/29/23  Charm Rings, NP  lurasidone (LATUDA) 40 MG TABS tablet Take 1 tablet (40 mg total) by mouth daily with supper. 12/30/22 01/29/23  Charm Rings, NP  metoprolol succinate (TOPROL-XL) 50 MG 24 hr tablet Take 1 tablet (50 mg total) by mouth daily. 12/31/22 01/30/23  Charm Rings, NP  tamsulosin (FLOMAX) 0.4 MG CAPS capsule Take 1 capsule (0.4 mg total) by mouth daily after supper. 12/22/22 06/20/23  Gillis Santa, MD  temazepam (RESTORIL) 15 MG capsule Take 1 capsule (15 mg total) by mouth at bedtime. 12/30/22 01/29/23  Charm Rings, NP  traMADol (ULTRAM) 50 MG tablet Take 50 mg by mouth every 6 (six) hours as needed.    [provider]  venlafaxine XR (EFFEXOR-XR) 75 MG 24 hr capsule Take 1 capsule (75 mg total) by mouth daily after breakfast. 12/31/22 01/30/23  Charm Rings, NP    Physical Exam: Vitals:   02/12/23 1649 02/12/23 1653 02/12/23 1656 02/12/23 2011  BP:   99/74 94/67  Pulse:  92  86  Resp:  16  16  Temp:  (!) 97.5 F (36.4 C)  97.8 F (36.6 C)  TempSrc:  Oral  Oral  SpO2:  97%  95%  Weight: 74.8 kg     Height: 5\' 9"  (1.753 m)      *** Data Reviewed: {Tip this will not be part of the note when signed- Document your independent interpretation of telemetry tracing, EKG, lab, Radiology test or any other diagnostic tests. Add any new diagnostic test ordered today. (Optional):26781} {Results:26384}  Assessment and Plan: No notes have been filed under this hospital service. Service: Hospitalist     Advance Care Planning:   Code Status: Prior ***  Consults: ***  Family Communication: ***  Severity of Illness: {Observation/Inpatient:21159}  Author: Frankey Shown, DO 02/12/2023 11:30 PM  For on call review [email or web address].

## 2023-02-12 NOTE — ED Notes (Signed)
Pt given snack at this time  

## 2023-02-12 NOTE — ED Notes (Signed)
RN called for lab work. Called Floor Phleb, Amber at [phone] to come collect.

## 2023-02-12 NOTE — ED Provider Notes (Signed)
[facility] Provider Note    Event Date/Time   First MD Initiated Contact with Patient 02/12/23 1835     (approximate)   History   Suicidal and Seizures   HPI  [name] is a 63 y.o. male who presents to the ED for evaluation of Suicidal and Seizures   I review a psychiatric DC summary from 12/30/2022 history of bipolar disorder polysubstance abuse, HTN, atrial flutter.  Often admitted psychiatrically for suicidal ideations.  Seizure disorder on Depakote.  Patient presents to the ED for evaluation of acute depression, suicidal thoughts as well as breakthrough seizures despite compliance with his Depakote.  Reports 4 "back-to-back" seizures last night despite adherence to his Depakote.  No seizure activity today but reports increasing depression and thoughts of hanging himself which is why he presents to [place] voluntarily.   Physical Exam   Triage Vital Signs: ED Triage Vitals  Encounter Vitals Group     BP 02/12/23 1656 99/74     Systolic BP Percentile --      Diastolic BP Percentile --      Pulse Rate 02/12/23 1653 92     Resp 02/12/23 1653 16     Temp 02/12/23 1653 (!) 97.5 F (36.4 C)     Temp Source 02/12/23 1653 Oral     SpO2 02/12/23 1653 97 %     Weight 02/12/23 1649 165 lb (74.8 kg)     Height 02/12/23 1649 5\' 9"  (1.753 m)     Head Circumference --      Peak Flow --      Pain Score 02/12/23 1650 10     Pain Loc --      Pain Education --      Exclude from Growth Chart --     Most recent vital signs: Vitals:   02/12/23 1656 02/12/23 2011  BP: 99/74 94/67  Pulse:  86  Resp:  16  Temp:  97.8 F (36.6 C)  SpO2:  95%    General: Awake, no distress.  CV:  Good peripheral perfusion.  Resp:  Normal effort.  Abd:  No distention.  MSK:  No deformity noted.  Neuro:  No focal deficits appreciated. Cranial nerves II through XII intact 5/5 strength and sensation in all 4 extremities Other:     ED Results / Procedures /  Treatments   Labs (all labs ordered are listed, but only abnormal results are displayed) Labs Reviewed  COMPREHENSIVE METABOLIC PANEL - Abnormal; Notable for the following components:      Result Value   CO2 17 (*)    Glucose, Bld 105 (*)    BUN 29 (*)    AST 230 (*)    ALT 61 (*)    Total Bilirubin 2.1 (*)    Anion gap 16 (*)    All other components within normal limits  SALICYLATE LEVEL - Abnormal; Notable for the following components:   Salicylate Lvl <7.0 (*)    All other components within normal limits  ACETAMINOPHEN LEVEL - Abnormal; Notable for the following components:   Acetaminophen (Tylenol), Serum <10 (*)    All other components within normal limits  VALPROIC ACID LEVEL - Abnormal; Notable for the following components:   Valproic Acid Lvl <10 (*)    All other components within normal limits  CK - Abnormal; Notable for the following components:   Total CK 17,322 (*)    All other components within normal limits  ETHANOL  CBC  URINE DRUG SCREEN, QUALITATIVE ([facility] ONLY)    EKG   RADIOLOGY   Official radiology report(s): No results found.  PROCEDURES and INTERVENTIONS:  Procedures  Medications  divalproex (DEPAKOTE) DR tablet 500 mg (500 mg Oral Given 02/12/23 2011)  lactated ringers bolus 2,000 mL (has no administration in time range)     IMPRESSION / MDM / ASSESSMENT AND PLAN / ED COURSE  I reviewed the triage vital signs and the nursing notes.  Differential diagnosis includes, but is not limited to, status epilepticus, medication noncompliance, rhabdomyolysis, polysubstance abuse  {Patient presents with symptoms of an acute illness or injury that is potentially life-threatening.  Patient with substance abuse, seizure disorder presents with acute depression and evidence of coexisting rhabdomyolysis.  He is here voluntarily and wishes to be here for help with his mental health, we will consult psychiatry and keep him here voluntarily for now but he is  not someone who had wanted to leave and would IVC if attempting to leave.  He reports back-to-back seizures last night and feeling weak and somewhat achy overall today so CK is obtained and elevated at 17,000 concerning for rhabdomyolysis.  Renal function is intact but mild derangements of LFTs possibly related to rhabdo.  No signs of neurologic deficits or trauma here.  Has a normal CBC.  Psychiatry plans to admit him but suspect he will require brief medical admission for rhabdomyolysis prior to this.      FINAL CLINICAL IMPRESSION(S) / ED DIAGNOSES   Final diagnoses:  Non-traumatic rhabdomyolysis  Seizure disorder (HCC)  Suicidal ideation     Rx / DC Orders   ED Discharge Orders     None        Note:  This document was prepared using Dragon voice recognition software and may include unintentional dictation errors.   Delton Prairie, MD 02/12/23 [phone]

## 2023-02-13 DIAGNOSIS — R569 Unspecified convulsions: Secondary | ICD-10-CM | POA: Diagnosis not present

## 2023-02-13 DIAGNOSIS — F1010 Alcohol abuse, uncomplicated: Secondary | ICD-10-CM | POA: Diagnosis not present

## 2023-02-13 DIAGNOSIS — F331 Major depressive disorder, recurrent, moderate: Secondary | ICD-10-CM

## 2023-02-13 DIAGNOSIS — F609 Personality disorder, unspecified: Secondary | ICD-10-CM

## 2023-02-13 DIAGNOSIS — R000 Tachycardia, unspecified: Secondary | ICD-10-CM

## 2023-02-13 DIAGNOSIS — R7401 Elevation of levels of liver transaminase levels: Secondary | ICD-10-CM

## 2023-02-13 DIAGNOSIS — F1910 Other psychoactive substance abuse, uncomplicated: Secondary | ICD-10-CM | POA: Diagnosis not present

## 2023-02-13 DIAGNOSIS — M6282 Rhabdomyolysis: Secondary | ICD-10-CM | POA: Diagnosis not present

## 2023-02-13 LAB — COMPREHENSIVE METABOLIC PANEL
ALT: 60 U/L — ABNORMAL HIGH (ref 0–44)
AST: 196 U/L — ABNORMAL HIGH (ref 15–41)
Albumin: 3.3 g/dL — ABNORMAL LOW (ref 3.5–5.0)
Alkaline Phosphatase: 48 U/L (ref 38–126)
Anion gap: 12 (ref 5–15)
BUN: 30 mg/dL — ABNORMAL HIGH (ref 6–20)
CO2: 22 mmol/L (ref 22–32)
Calcium: 9.2 mg/dL (ref 8.9–10.3)
Chloride: 104 mmol/L (ref 98–111)
Creatinine, Ser: 1 mg/dL (ref 0.61–1.24)
GFR, Estimated: >60 mL/min (ref >60)
Glucose, Bld: 104 mg/dL — ABNORMAL HIGH (ref 70–99)
Potassium: 3.9 mmol/L (ref 3.5–5.1)
Sodium: 138 mmol/L (ref 135–145)
Total Bilirubin: 1.1 mg/dL (ref 0.0–1.2)
Total Protein: 6.5 g/dL (ref 6.5–8.1)

## 2023-02-13 LAB — CBC
HCT: 37.4 % — ABNORMAL LOW (ref 39.0–52.0)
HCT: 39 % (ref 39.0–52.0)
Hemoglobin: 12.8 g/dL — ABNORMAL LOW (ref 13.0–17.0)
Hemoglobin: 13.4 g/dL (ref 13.0–17.0)
MCH: 30.7 pg (ref 26.0–34.0)
MCH: 30.6 pg (ref 26.0–34.0)
MCHC: 34.2 g/dL (ref 30.0–36.0)
MCHC: 34.4 g/dL (ref 30.0–36.0)
MCV: 89.7 fL (ref 80.0–100.0)
MCV: 89 fL (ref 80.0–100.0)
Platelets: 156 10*3/uL (ref 150–400)
Platelets: 158 10*3/uL (ref 150–400)
RBC: 4.17 MIL/uL — ABNORMAL LOW (ref 4.22–5.81)
RBC: 4.38 MIL/uL (ref 4.22–5.81)
RDW: 14.2 % (ref 11.5–15.5)
RDW: 14.1 % (ref 11.5–15.5)
WBC: 7.6 10*3/uL (ref 4.0–10.5)
WBC: 7.8 10*3/uL (ref 4.0–10.5)
nRBC: 0 % (ref 0.0–0.2)
nRBC: 0.3 % — ABNORMAL HIGH (ref 0.0–0.2)

## 2023-02-13 LAB — URINE DRUG SCREEN, QUALITATIVE (ARMC ONLY)
Amphetamines, Ur Screen: NOT DETECTED
Barbiturates, Ur Screen: POSITIVE — AB
Benzodiazepine, Ur Scrn: NOT DETECTED
Cannabinoid 50 Ng, Ur ~~LOC~~: POSITIVE — AB
Cocaine Metabolite,Ur ~~LOC~~: POSITIVE — AB
MDMA (Ecstasy)Ur Screen: NOT DETECTED
Methadone Scn, Ur: NOT DETECTED
Opiate, Ur Screen: NOT DETECTED
Phencyclidine (PCP) Ur S: NOT DETECTED
Tricyclic, Ur Screen: NOT DETECTED

## 2023-02-13 LAB — CREATININE, SERUM
Creatinine, Ser: 1.06 mg/dL (ref 0.61–1.24)
GFR, Estimated: >60 mL/min (ref >60)

## 2023-02-13 LAB — CK: Total CK: 7997 U/L — ABNORMAL HIGH (ref 49–397)

## 2023-02-13 LAB — MAGNESIUM: Magnesium: 2.1 mg/dL (ref 1.7–2.4)

## 2023-02-13 LAB — PHOSPHORUS: Phosphorus: 1.7 mg/dL — ABNORMAL LOW (ref 2.5–4.6)

## 2023-02-13 MED ORDER — ENOXAPARIN SODIUM 40 MG/0.4ML IJ SOSY
40.0000 mg | PREFILLED_SYRINGE | INTRAMUSCULAR | Status: DC
  Administered 2023-02-13 – 2023-02-15 (×3): 40 mg via SUBCUTANEOUS
  Filled 2023-02-13 (×4): qty 0.4

## 2023-02-13 MED ORDER — LACTATED RINGERS IV SOLN: INTRAVENOUS | Status: AC

## 2023-02-13 MED ORDER — ACETAMINOPHEN 650 MG RE SUPP: 650.0000 mg | Freq: Four times a day (QID) | RECTAL | Status: DC | PRN

## 2023-02-13 MED ORDER — TAMSULOSIN HCL 0.4 MG PO CAPS
0.4000 mg | ORAL_CAPSULE | Freq: Every day | ORAL | Status: DC
  Administered 2023-02-13 – 2023-02-14 (×2): 0.4 mg via ORAL
  Filled 2023-02-13 (×2): qty 1

## 2023-02-13 MED ORDER — METOPROLOL SUCCINATE ER 50 MG PO TB24
50.0000 mg | ORAL_TABLET | Freq: Every day | ORAL | Status: DC
  Administered 2023-02-13 – 2023-02-15 (×3): 50 mg via ORAL
  Filled 2023-02-13 (×3): qty 1

## 2023-02-13 MED ORDER — KETOROLAC TROMETHAMINE 15 MG/ML IJ SOLN
15.0000 mg | Freq: Once | INTRAMUSCULAR | Status: AC
  Administered 2023-02-13: 15 mg via INTRAVENOUS
  Filled 2023-02-13: qty 1

## 2023-02-13 MED ORDER — SODIUM PHOSPHATES 45 MMOLE/15ML IV SOLN
30.0000 mmol | Freq: Once | INTRAVENOUS | Status: AC
  Administered 2023-02-13: 30 mmol via INTRAVENOUS
  Filled 2023-02-13: qty 10

## 2023-02-13 MED ORDER — LACTATED RINGERS IV SOLN
INTRAVENOUS | Status: AC
Start: 2023-02-13 — End: 2023-02-13

## 2023-02-13 MED ORDER — METOPROLOL TARTRATE 5 MG/5ML IV SOLN
5.0000 mg | INTRAVENOUS | Status: DC | PRN
  Administered 2023-02-13: 5 mg via INTRAVENOUS
  Filled 2023-02-13: qty 5

## 2023-02-13 MED ORDER — ONDANSETRON HCL 4 MG/2ML IJ SOLN: 4.0000 mg | Freq: Four times a day (QID) | INTRAMUSCULAR | Status: DC | PRN

## 2023-02-13 MED ORDER — LORAZEPAM 2 MG/ML IJ SOLN: 2.0000 mg | INTRAMUSCULAR | Status: DC | PRN

## 2023-02-13 MED ORDER — ACETAMINOPHEN 325 MG PO TABS
650.0000 mg | ORAL_TABLET | Freq: Four times a day (QID) | ORAL | Status: DC | PRN
  Administered 2023-02-13 – 2023-02-14 (×2): 650 mg via ORAL
  Filled 2023-02-13 (×2): qty 2

## 2023-02-13 MED ORDER — ONDANSETRON HCL 4 MG PO TABS: 4.0000 mg | ORAL_TABLET | Freq: Four times a day (QID) | ORAL | Status: DC | PRN

## 2023-02-13 NOTE — Hospital Course (Addendum)
Taken from H&P.   [name] is a 63 y.o. male with medical history significant of hypertension, major depressive disorder with psychosis, substance abuse, history of suicidal ideations, seizures who presents to the emergency department for evaluation of suicidal thoughts, acute depression and breakthrough seizures despite compliance with his Depakote.  Patient states that he had about 4 episodes of "back-to-back "seizures last night despite being compliant with his Depakote.  He complained of left flank pain after recovering from the seizures.  On presentation vital normal, labs with bicarb of 17, BUN 29, AST 230, ALT 61, ALP 59, T. bili 2.1, anion gap 16, valproic acid less than 10, CK 17,322, Toxicology screening negative for alcohol, salicylate and acetaminophen.  UDS positive for cocaine, barbiturates and cannabinoid. Patient was evaluated by psych and will be admitted to inpatient psychiatry after medical clearance.  2/3: CK improving at 7997, hypophosphatemia at 1.7, improving transaminitis. Consulting neurology as patient claimed that he was taking his Depakote regularly.  Phosphorus is being repleted Patient developed sinus tachycardia, restarting home Toprol.  2/4: CK continue to improve, currently at 3868, ammonia levels elevated at 40, per neurology likely due to Depakote and they are suggesting adding L-carnitine 500 mg daily to help.  Will continue IV fluid for another day.

## 2023-02-13 NOTE — Assessment & Plan Note (Signed)
Patient with history of alcohol abuse.  Transaminitis improving. -Continue to monitor

## 2023-02-13 NOTE — ED Notes (Signed)
VOL  MEDICAL  ADMIT

## 2023-02-13 NOTE — ED Notes (Signed)
CCMD notified of pt telemetry orders.

## 2023-02-13 NOTE — ED Notes (Signed)
 Hospital meal provided.  100% consumed, pt tolerated w/o complaints.  Waste discarded appropriately.

## 2023-02-13 NOTE — ED Notes (Signed)
Shower offered and encouraged, refused.

## 2023-02-13 NOTE — Assessment & Plan Note (Signed)
Secondary to recurrent breakthrough seizures.  CK on admission was 17 322>>7997 -Continue with IV fluid for another day -Monitor CK

## 2023-02-13 NOTE — ED Notes (Signed)
Dr. Nelson Chimes notified of pt increase in HR back up to 130's via secure chat.

## 2023-02-13 NOTE — Assessment & Plan Note (Signed)
Patient with history of bipolar disorder.  Remains suicidal. He was evaluated by psych and he will go to inpatient psych after medical clearance, likely by tomorrow. -Suicidal precautions

## 2023-02-13 NOTE — ED Notes (Signed)
Dr. Nelson Chimes notified of most recent VS after IV metoprolol given.

## 2023-02-13 NOTE — ED Notes (Signed)
 Pt given dinner tray and beverage

## 2023-02-13 NOTE — ED Notes (Signed)
Dr. Nelson Chimes notified of EKG obtained and exported into chart as well as most recent VS and tachycardia.

## 2023-02-13 NOTE — Assessment & Plan Note (Signed)
Per patient he was compliant with Depakote but levels were undetectable, so questionable compliance due to underlying mental instability.  No more breakthrough seizures noted while in the hospital -Restarting home dose -IV Ativan as needed -Continue to monitor

## 2023-02-13 NOTE — H&P (Signed)
Briefly discussed with Dr. Nelson Chimes, who is inquiring about any changes that needed to be made to patient's antiseizure regimen noting he is on Depakote and reporting adherence.     This is a 63 year old patient with a known history of seizures, alcohol abuse, also presenting with cocaine positive UDS, cluster B personality disorder, depression, suicidal ideation.  No neurology notes are available in our EMR at this time  Rhabdomyolysis in the setting of cocaine and seizures, CK improving from 17,322 to 7997  Transaminitis felt to be secondary to alcohol abuse (improving, and may also be partially secondary to rhabdomyolysis)  UDS was positive for cocaine, barbiturates and cannabinoids  Regarding his breakthrough seizures although he reported Depakote adherence clearly based on his level he was not absorbing this medication. Given his transaminitis is fairly mild and already improving it is okay to continue his Depakote at this time Needs counseling on avoidance of cocaine; alcohol withdrawal can also lower seizure threshold Continue to carefully monitor electrolytes and replete as needed as derangements can also lower seizure threshold Other toxic/infectious/metabolic workup per primary team Ammonia level ordered for the morning; if elevated could consider addition of L-carnitine daily 500 mg daily to reduce Depakote related hyperammonemia  Certainly if patient is having further concern for seizure-like activity despite resumption of Depakote and cessation of cocaine, or if there are other acute neurological concerns that arise, please do reach out to neurology for full consultation  8 minutes spent in patient care, majority in discussion with Dr. Nelson Chimes via secure chat   These are curbside recommendations based upon the information readily available in the chart on brief review as well as history and examination information provided to me by requesting provider and do not replace a full detailed  consult

## 2023-02-13 NOTE — Assessment & Plan Note (Signed)
UDS positive for cocaine, cannabinoid and barbiturates. -Counseling was provided

## 2023-02-13 NOTE — Progress Notes (Signed)
Progress Note   Patient: [name] QIH:[id number] DOB: 13-Apr-1960 DOA: 02/12/2023     1 DOS: the patient was seen and examined on 02/13/2023   Brief hospital course: Taken from H&P.   [name] is a 63 y.o. male with medical history significant of hypertension, major depressive disorder with psychosis, substance abuse, history of suicidal ideations, seizures who presents to the emergency department for evaluation of suicidal thoughts, acute depression and breakthrough seizures despite compliance with his Depakote.  Patient states that he had about 4 episodes of "back-to-back "seizures last night despite being compliant with his Depakote.  He complained of left flank pain after recovering from the seizures.  On presentation vital normal, labs with bicarb of 17, BUN 29, AST 230, ALT 61, ALP 59, T. bili 2.1, anion gap 16, valproic acid less than 10, CK 17,322, Toxicology screening negative for alcohol, salicylate and acetaminophen.  UDS positive for cocaine, barbiturates and cannabinoid. Patient was evaluated by psych and will be admitted to inpatient psychiatry after medical clearance.  2/3: CK improving at 7997, hypophosphatemia at 1.7, improving transaminitis. Consulting neurology as patient claimed that he was taking his Depakote regularly.  Phosphorus is being repleted Patient developed sinus tachycardia, restarting home Toprol.    Assessment and Plan: * Rhabdomyolysis Secondary to recurrent breakthrough seizures.  CK on admission was 17 322>>7997 -Continue with IV fluid for another day -Monitor CK  Moderate episode of recurrent major depressive disorder (HCC) Patient with history of bipolar disorder.  Remains suicidal. He was evaluated by psych and he will go to inpatient psych after medical clearance, likely by tomorrow. -Suicidal precautions  Sinus tachycardia Patient with sinus tachycardia this morning, no symptoms. -Restarted home Toprol -Check TSH  Seizures  (HCC) Per patient he was compliant with Depakote but levels were undetectable, so questionable compliance due to underlying mental instability.  No more breakthrough seizures noted while in the hospital -Restarting home dose -IV Ativan as needed -Continue to monitor  Transaminitis Patient with history of alcohol abuse.  Transaminitis improving. -Continue to monitor  Alcohol abuse Alcohol levels undetectable. -CIWA protocol  Polysubstance abuse (HCC) UDS positive for cocaine, cannabinoid and barbiturates. -Counseling was provided   Subjective: Patient was resting comfortably when seen today.  Still having suicidal thoughts.  Per patient he was compliant with his medications.  Physical Exam: Vitals:   02/13/23 1030 02/13/23 1045 02/13/23 1130 02/13/23 1230  BP: 91/74  (!) 107/93 104/82  Pulse: 94 98    Resp: 18  11   Temp:      TempSrc:      SpO2: 95% 97%    Weight:      Height:       General.  Well-developed gentleman, in no acute distress. Pulmonary.  Lungs clear bilaterally, normal respiratory effort. CV.  Regular rate and rhythm, no JVD, rub or murmur. Abdomen.  Soft, nontender, nondistended, BS positive. CNS.  Alert and oriented .  No focal neurologic deficit. Extremities.  No edema, no cyanosis, pulses intact and symmetrical.   Data Reviewed: Prior data reviewed  Family Communication: No contact in chart.  Discussed with patient  Disposition: Status is: Inpatient Remains inpatient appropriate because: Severity of illness, need to go to inpatient behavioral health  Planned Discharge Destination: Inpatient behavioral health  DVT prophylaxis.  Lovenox Time spent: 45 minutes  This record has been created using Conservation officer, historic buildings. Errors have been sought and corrected,but may not always be located. Such creation errors do not reflect  on the standard of care.   Author: Arnetha Courser, MD 02/13/2023 1:12 PM  For on call review [email or web address].

## 2023-02-13 NOTE — ED Notes (Signed)
Vol/consult done/pt recommended for inpatient psychiatric hospitalization when medically cleared.

## 2023-02-13 NOTE — ED Notes (Addendum)
Pt up to bathroom   pt waiting on admission

## 2023-02-13 NOTE — ED Notes (Signed)
Dr. Nelson Chimes notified via secure chat of pt HR 111-120 and afib rhythm. EKG ordered.

## 2023-02-13 NOTE — Assessment & Plan Note (Signed)
Alcohol levels undetectable. -CIWA protocol

## 2023-02-13 NOTE — Assessment & Plan Note (Signed)
Patient with sinus tachycardia this morning, no symptoms. -Restarted home Toprol -Check TSH

## 2023-02-14 ENCOUNTER — Encounter: Payer: Self-pay | Admitting: Internal Medicine

## 2023-02-14 DIAGNOSIS — R45851 Suicidal ideations: Secondary | ICD-10-CM | POA: Diagnosis not present

## 2023-02-14 DIAGNOSIS — F331 Major depressive disorder, recurrent, moderate: Secondary | ICD-10-CM | POA: Diagnosis not present

## 2023-02-14 DIAGNOSIS — R569 Unspecified convulsions: Secondary | ICD-10-CM | POA: Diagnosis not present

## 2023-02-14 DIAGNOSIS — M6282 Rhabdomyolysis: Secondary | ICD-10-CM | POA: Diagnosis not present

## 2023-02-14 DIAGNOSIS — F1010 Alcohol abuse, uncomplicated: Secondary | ICD-10-CM | POA: Diagnosis not present

## 2023-02-14 LAB — CBC
HCT: 35.9 % — ABNORMAL LOW (ref 39.0–52.0)
Hemoglobin: 12.2 g/dL — ABNORMAL LOW (ref 13.0–17.0)
MCH: 30.6 pg (ref 26.0–34.0)
MCHC: 34 g/dL (ref 30.0–36.0)
MCV: 90 fL (ref 80.0–100.0)
Platelets: 157 10*3/uL (ref 150–400)
RBC: 3.99 MIL/uL — ABNORMAL LOW (ref 4.22–5.81)
RDW: 14.3 % (ref 11.5–15.5)
WBC: 5.5 10*3/uL (ref 4.0–10.5)
nRBC: 0 % (ref 0.0–0.2)

## 2023-02-14 LAB — BASIC METABOLIC PANEL
Anion gap: 9 (ref 5–15)
BUN: 19 mg/dL (ref 6–20)
CO2: 21 mmol/L — ABNORMAL LOW (ref 22–32)
Calcium: 8.7 mg/dL — ABNORMAL LOW (ref 8.9–10.3)
Chloride: 109 mmol/L (ref 98–111)
Creatinine, Ser: 0.66 mg/dL (ref 0.61–1.24)
GFR, Estimated: >60 mL/min (ref >60)
Glucose, Bld: 91 mg/dL (ref 70–99)
Potassium: 4.1 mmol/L (ref 3.5–5.1)
Sodium: 139 mmol/L (ref 135–145)

## 2023-02-14 LAB — CK: Total CK: 3868 U/L — ABNORMAL HIGH (ref 49–397)

## 2023-02-14 LAB — AMMONIA: Ammonia: 40 umol/L — ABNORMAL HIGH (ref 9–35)

## 2023-02-14 MED ORDER — LACTATED RINGERS IV SOLN: INTRAVENOUS | Status: AC

## 2023-02-14 MED ORDER — LEVOCARNITINE 1 GM/10ML PO SOLN
500.0000 mg | Freq: Every day | ORAL | Status: DC
  Administered 2023-02-14 – 2023-02-15 (×2): 500 mg via ORAL
  Filled 2023-02-14 (×3): qty 5

## 2023-02-14 NOTE — ED Notes (Signed)
 Hospital meal provided.  100% consumed, pt tolerated w/o complaints.  Waste discarded appropriately.

## 2023-02-14 NOTE — ED Notes (Signed)
 Pt ambulated to bathroom to preform ADL's no assistance required

## 2023-02-14 NOTE — Assessment & Plan Note (Signed)
Resolved after restarting home Toprol. Thyroid panel pending -Continue with home Toprol

## 2023-02-14 NOTE — Consult Note (Addendum)
 [facility] Psychiatric Consult Follow-up  Patient Name: .[name]  MRN: [id number]  DOB: [birth year]  Consult Order details:  Orders (From admission, onward)     Start     Ordered   02/12/23 2129  CONSULT TO CALL ACT TEAM       Ordering Provider: Sharps Rover, MD  Provider:  (Not yet assigned)  Question:  Reason for Consult?  Answer:  Psych consult   02/12/23 2128   02/12/23 2129  IP CONSULT TO PSYCHIATRY       Ordering Provider: Sharps Rover, MD  Provider:  (Not yet assigned)  Question Answer Comment  Place call to: psych   Reason for Consult Admit      02/12/23 2128            Mode of Visit: In person   Psychiatry Consult Evaluation  Service Date: February 14, 2023 LOS:  LOS: 2 days  Chief Complaint suicide  Primary Psychiatric Diagnoses  Suicidal ideation  Assessment  [name] is a 63 y.o. male  medically admitted after presenting to [facility] on 02/12/23 for suicidal thoughts, seizures. Was noted to have elevated CK and admitted to hospitalist service for management of rhabdomyolysis. Was seen by psychiatry on 2/2 and recommended for inpatient psychiatric admission. On assessment today, endorses active suicidal ideations w/ plan to hang himself as well as auditory hallucinations of his younger brother telling him to kill yourself. Pt is well known to the psychiatry service line, with multiple ED presentations, and inpatient psychiatric admissions at this facility. Case was discussed with attending psychiatrist, Dr. Victoria, who is recommending inpatient psychiatric admission at this time. Reached out to Dr. Caleen of hospitalist team and pt is expected to be medically cleared tomorrow.  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)   Moderate episode of recurrent major depressive disorder (HCC)   Malingering   Suicidal ideation   Seizures  (HCC)   Transaminitis   Polysubstance abuse (HCC)   Sinus tachycardia   Plan   ## Psychiatric Medication Recommendations:  --Discharged from [facility] IP psychiatry on 12/30/22 on: Divalproex  sodium 500mg  oral 2 times daily Lurasidone  HCl 40mg  oral daily with supper Metoprolol  Succinate 50mg  oral daily Tamsulosin  HCl 0.4mg  oral daily after supper Temazepam  15mg  oral daily at bedtime Tramadol  HCl 50mg  oral every 6 hours PRN Venlafaxine  HCl 75mg  oral daily with breakfast  Did discuss possibly restarting psychotropics and pt prefers to wait until inpatient psychiatric admission  ## Medical Decision Making Capacity: Not specifically addressed in this encounter  ## Further Work-up:  -- Defer to primary team -- most recent EKG on 02/13/23 had QtC of 490  ## Disposition:-- We recommend inpatient psychiatric hospitalization when medically cleared. Patient is under voluntary admission status at this time; please IVC if attempts to leave hospital.  ## Behavioral / Environmental: -Utilize compassion and acknowledge the patient's experiences while setting clear and realistic expectations for care.  ## Safety and Observation Level:  - Based on my clinical evaluation, I estimate the patient to be at LOW risk of self harm in the current setting. - At this time, we recommend  routine safety precautions. This decision is based on my review of the chart including patient's history and current presentation, interview of the patient, mental status examination, and consideration of suicide risk including evaluating suicidal ideation, plan, intent, suicidal or self-harm behaviors, risk factors, and protective  factors. This judgment is based on our ability to directly address suicide risk, implement suicide prevention strategies, and develop a safety plan while the patient is in the clinical setting. Please contact our team if there is a concern that risk level has changed.  CSSR Risk Category:C-SSRS RISK  CATEGORY: High Risk  Suicide Risk Assessment: Patient has following modifiable risk factors for suicide: active suicidal ideation, substance abuse. Patient has following non-modifiable or demographic risk factors for suicide: male gender, history of self harm behavior, and psychiatric hospitalization, history of substance abuse Patient has the following protective factors against suicide: no history of suicide attempts  Thank you for this consult request. Recommendations have been communicated to the primary team.  We will recommend inpatient psychiatric hospitalization at this time.   [name], NP      History of Present Illness   Patient Report:  Pt reports he presented to [facility] because suicide. Reports he had couple of seizures which brought on suicidal ideation. Endorses current active suicidal ideation with plan to hang himself. Also endorses auditory hallucinations of his younger brother telling him kill yourself. Reports his younger brother died by suicide by firearm. He denies visual hallucinations. He denies paranoia. He denies homicidal ideations. He reports intranasal use of cocaine which was first time in months as well as puff of weed prior to presenting to the emergency department. He denies use of methamphetamines, opioids or other substances. Reports following last inpatient psychiatric hospitalization he attempted to follow up at [facility] but when he went was told walk-ins were full and to come back which caused him to just give up on it. He does not feel that his substance use is a problem. He is not interested in residential substance use treatment.   He endorses history of non suicidal self injurious behavior, last occurring several years ago. He denies history of suicide attempt. He endorses history of multiple inpatient psychiatric hospitalization.   Pt reports he is on disability for bipolar disorder. Receives [phone] a month. Reports he has already run out  this month after paying rent and food.   He lives with roommate.  He does not have any children.   He denies pending charges/court dates.   He denies access to a firearm or other weapon.   He states there is no one to call for collateral.  Psych ROS:  Endorses active suicidal ideation w/ plan to hang himself Endorses auditory hallucinations of his younger brother telling him to kill yourself Denies homicidal ideation Denies visual hallucinations Denies paranoia  Collateral information:  Pt states there is no one to call for collateral  Review of Systems  Constitutional:  Negative for chills and fever.  Respiratory:  Negative for shortness of breath.   Cardiovascular:  Negative for chest pain.  Gastrointestinal:  Negative for abdominal pain.     Psychiatric and Social History   Psychiatric History:  See HPI  Social History:  See HPI Access to weapons/lethal means: pt denies   Substance History See HPI  Exam Findings  Vital Signs:  Temp:  [97.8 F (36.6 C)-98.2 F (36.8 C)] 98.2 F (36.8 C) (02/04 0757) Pulse Rate:  [73-132] 76 (02/04 0730) Resp:  [11-18] 18 (02/04 0700) BP: (91-121)/(63-94) 121/83 (02/04 0900) SpO2:  [94 %-98 %] 95 % (02/04 0730) Blood pressure 121/83, pulse 76, temperature 98.2 F (36.8 C), temperature source Oral, resp. rate 18, height 5' 9 (1.753 m), weight 74.8 kg, SpO2 95%. Body mass index is 24.37 kg/m.  Physical Exam Constitutional:      General: He is not in acute distress.    Appearance: He is not ill-appearing, toxic-appearing or diaphoretic.  Pulmonary:     Effort: Pulmonary effort is normal. No respiratory distress.  Neurological:     Mental Status: He is alert and oriented to person, place, and time.  Psychiatric:        Attention and Perception: Attention normal. He perceives auditory hallucinations.        Mood and Affect: Mood is depressed. Affect is blunt.        Speech: Speech normal.        Behavior: Behavior  normal. Behavior is cooperative.        Thought Content: Thought content is not paranoid or delusional. Thought content includes suicidal ideation. Thought content does not include homicidal ideation. Thought content includes suicidal plan.        Cognition and Memory: Cognition and memory normal.        Judgment: Judgment is inappropriate.     Mental Status Exam: General Appearance:  Appropriate for environment  Orientation:  Full (Time, Place, and Person)  Memory:  Immediate;   Fair  Concentration:  Concentration: Fair and Attention Span: Fair  Recall:  Fair  Attention  Fair  Eye Contact:  Fair  Speech:  Clear and Coherent and Normal Rate  Language:  Fair  Volume:  Decreased  Mood: depressed  Affect:  Blunt  Thought Process:  Coherent, Goal Directed, and Linear  Thought Content:  Logical  Suicidal Thoughts:  Yes.  with intent/plan  Homicidal Thoughts:  No  Judgement:  Poor  Insight:  Shallow  Psychomotor Activity:  Normal  Akathisia:  No  Fund of Knowledge:  Fair   Assets:  Manufacturing Systems Engineer Desire for Improvement Financial Resources/Insurance Housing Resilience  Cognition:  WNL  ADL's:  Intact  AIMS (if indicated):      Other History   These have been pulled in through the EMR, reviewed, and updated if appropriate.  Family History:  The patient's family history includes CVA in his mother; Depression in his brother.  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 echo 5/17: EF 60-65%, no RWMA, LV dias fxn nl, LA mildly dilated, RV sys fxn nl, PASP nl, moderate sized circumferential pericardial effusion was  identified, 2.12 cm around the LV free wall, <1 cm around the RV free wall. Features were not c/w tamponade physiology   PTSD (post-traumatic stress disorder)    Witnessed brother's suicide.   Restless leg syndrome    Seizures (HCC)    Syncope    Surgical History: Past Surgical History:  Procedure Laterality Date   APPENDECTOMY     CYSTOSCOPY WITH URETEROSCOPY AND STENT PLACEMENT     ESOPHAGOGASTRODUODENOSCOPY N/A 01/11/2016   Procedure: ESOPHAGOGASTRODUODENOSCOPY (EGD);  Surgeon: Jerrell Sol, MD;  Location: [facility] ENDOSCOPY;  Service: Endoscopy;  Laterality: N/A;   ESOPHAGOGASTRODUODENOSCOPY N/A 04/09/2020   Procedure: ESOPHAGOGASTRODUODENOSCOPY (EGD);  Surgeon: Therisa Bi, MD;  Location: [facility] ENDOSCOPY;  Service: Gastroenterology;  Laterality: N/A;  INCISION AND DRAINAGE ABSCESS Left 01/02/2018   Procedure: INCISION AND DRAINAGE LEFT KNEE;  Surgeon: Cleotilde Barrio, MD;  Location: [facility] ORS;  Service: Orthopedics;  Laterality: Left;   JOINT REPLACEMENT Right    TKR   KNEE ARTHROSCOPY Right 06/25/2014   Procedure: ARTHROSCOPY KNEE;  Surgeon: Barrio Cleotilde, MD;  Location: [facility] ORS;  Service: Orthopedics;  Laterality: Right;  partial arthroscopic medial menisectomy   LAPAROSCOPIC APPENDECTOMY N/A 06/02/2021   Procedure: APPENDECTOMY LAPAROSCOPIC;  Surgeon: Lane Shope, MD;  Location: [facility] ORS;  Service: General;  Laterality: N/A;   TOTAL KNEE ARTHROPLASTY Right 04/22/2015   Procedure: TOTAL KNEE ARTHROPLASTY;  Surgeon: Barrio Cleotilde, MD;  Location: [facility] ORS;  Service: Orthopedics;  Laterality: Right;   TOTAL KNEE ARTHROPLASTY Left 10/30/2017   Procedure: TOTAL KNEE ARTHROPLASTY;  Surgeon: Cleotilde Barrio, MD;  Location: [facility] ORS;  Service: Orthopedics;  Laterality: Left;   TOTAL KNEE REVISION Left 01/02/2018   Procedure: poly exchange of tibia and patella left knee;  Surgeon: Cleotilde Barrio, MD;  Location: [facility] ORS;  Service: Orthopedics;  Laterality: Left;   UMBILICAL HERNIA  REPAIR  06/02/2021   Procedure: HERNIA REPAIR UMBILICAL ADULT;  Surgeon: Lane Shope, MD;  Location: [facility] ORS;  Service: General;;   Medications:   Current Facility-Administered Medications:    acetaminophen  (TYLENOL ) tablet 650 mg, 650 mg, Oral, Q6H PRN, 650 mg at 02/13/23 0248 **OR** acetaminophen  (TYLENOL ) suppository 650 mg, 650 mg, Rectal, Q6H PRN, Adefeso, Oladapo, DO   divalproex  (DEPAKOTE ) DR tablet 500 mg, 500 mg, Oral, Q12H, Claudene Rover, MD, 500 mg at 02/14/23 0900   enoxaparin  (LOVENOX ) injection 40 mg, 40 mg, Subcutaneous, Q24H, Adefeso, Oladapo, DO, 40 mg at 02/14/23 0844   levOCARNitine  (CARNITOR ) 1 GM/10ML solution 500 mg, 500 mg, Oral, Daily, Amin, Sumayya, MD   LORazepam  (ATIVAN ) injection 2 mg, 2 mg, Intravenous, Q4H PRN, Amin, Sumayya, MD   metoprolol  succinate (TOPROL -XL) 24 hr tablet 50 mg, 50 mg, Oral, Daily, Amin, Sumayya, MD, 50 mg at 02/14/23 0900   metoprolol  tartrate (LOPRESSOR ) injection 5 mg, 5 mg, Intravenous, Q2H PRN, Amin, Sumayya, MD, 5 mg at 02/13/23 9166   ondansetron  (ZOFRAN ) tablet 4 mg, 4 mg, Oral, Q6H PRN **OR** ondansetron  (ZOFRAN ) injection 4 mg, 4 mg, Intravenous, Q6H PRN, Adefeso, Oladapo, DO   tamsulosin  (FLOMAX ) capsule 0.4 mg, 0.4 mg, Oral, QPC supper, Amin, Sumayya, MD, 0.4 mg at 02/13/23 1713  Current Outpatient Medications:    divalproex  (DEPAKOTE ) 500 MG DR tablet, Take 1 tablet (500 mg total) by mouth 2 (two) times daily., Disp: 60 tablet, Rfl: 0   lurasidone  (LATUDA ) 40 MG TABS tablet, Take 1 tablet (40 mg total) by mouth daily with supper., Disp: 30 tablet, Rfl: 0   metoprolol  succinate (TOPROL -XL) 50 MG 24 hr tablet, Take 1 tablet (50 mg total) by mouth daily., Disp: 30 tablet, Rfl: 0   tamsulosin  (FLOMAX ) 0.4 MG CAPS capsule, Take 1 capsule (0.4 mg total) by mouth daily after supper., Disp: , Rfl:    temazepam  (RESTORIL ) 15 MG capsule, Take 1 capsule (15 mg total) by mouth at bedtime., Disp: 30 capsule, Rfl: 0   traMADol  (ULTRAM ) 50  MG tablet, Take 50 mg by mouth every 6 (six) hours as needed., Disp: , Rfl:    venlafaxine  XR (EFFEXOR -XR) 75 MG 24 hr capsule, Take 1 capsule (75 mg total) by mouth daily after breakfast., Disp: 30 capsule, Rfl: 0  Allergies: No Known Allergies  [name], NP

## 2023-02-14 NOTE — Progress Notes (Signed)
Briefly discussed with Dr. Nelson Chimes, who is inquiring about any changes that needed to be made to patient's antiseizure regimen noting he is on Depakote and reporting adherence.     This is a 63 year old patient with a known history of seizures, alcohol abuse, also presenting with cocaine positive UDS, cluster B personality disorder, depression, suicidal ideation.  No neurology notes are available in our EMR at this time  Rhabdomyolysis in the setting of cocaine and seizures, CK improving from 17,322 to 7997  Transaminitis felt to be secondary to alcohol abuse (improving, and may also be partially secondary to rhabdomyolysis)  UDS was positive for cocaine, barbiturates and cannabinoids  Regarding his breakthrough seizures although he reported Depakote adherence clearly based on his level he was not absorbing this medication. Given his transaminitis is fairly mild and already improving it is okay to continue his Depakote at this time Needs counseling on avoidance of cocaine; alcohol withdrawal can also lower seizure threshold Continue to carefully monitor electrolytes and replete as needed as derangements can also lower seizure threshold Other toxic/infectious/metabolic workup per primary team Ammonia level ordered for the morning; if elevated could consider addition of L-carnitine daily 500 mg daily to reduce Depakote related hyperammonemia  Certainly if patient is having further concern for seizure-like activity despite resumption of Depakote and cessation of cocaine, or if there are other acute neurological concerns that arise, please do reach out to neurology for full consultation  8 minutes spent in patient care, majority in discussion with Dr. Nelson Chimes via secure chat   These are curbside recommendations based upon the information readily available in the chart on brief review as well as history and examination information provided to me by requesting provider and do not replace a full detailed  consult

## 2023-02-14 NOTE — Progress Notes (Signed)
 Progress Note   Patient: [name] FMW:[id number] DOB: [birth year] DOA: 02/12/2023     2 DOS: the patient was seen and examined on 02/14/2023   Brief hospital course: Taken from H&P.   [name] is a 63 y.o. male with medical history significant of hypertension, major depressive disorder with psychosis, substance abuse, history of suicidal ideations, seizures who presents to the emergency department for evaluation of suicidal thoughts, acute depression and breakthrough seizures despite compliance with his Depakote .  Patient states that he had about 4 episodes of back-to-back seizures last night despite being compliant with his Depakote .  He complained of left flank pain after recovering from the seizures.  On presentation vital normal, labs with bicarb of 17, BUN 29, AST 230, ALT 61, ALP 59, T. bili 2.1, anion gap 16, valproic acid  less than 10, CK 17,322, Toxicology screening negative for alcohol, salicylate and acetaminophen .  UDS positive for cocaine, barbiturates and cannabinoid. Patient was evaluated by psych and will be admitted to inpatient psychiatry after medical clearance.  2/3: CK improving at 7997, hypophosphatemia at 1.7, improving transaminitis. Consulting neurology as patient claimed that he was taking his Depakote  regularly.  Phosphorus is being repleted Patient developed sinus tachycardia, restarting home Toprol .  2/4: CK continue to improve, currently at 3868, ammonia levels elevated at 40, per neurology likely due to Depakote  and they are suggesting adding L-carnitine 500 mg daily to help.  Will continue IV fluid for another day.   Assessment and Plan: * Rhabdomyolysis Secondary to recurrent breakthrough seizures.  CK on admission was 17 322>>7997>>3868 -Continue with IV fluid for another day -Monitor CK  Moderate episode of recurrent major depressive disorder (HCC) Patient with history of bipolar disorder.  Remains suicidal. He was evaluated by psych  and he will go to inpatient psych after medical clearance, likely by tomorrow. -Suicidal precautions  Sinus tachycardia Resolved after restarting home Toprol . Thyroid  panel pending -Continue with home Toprol   Seizures (HCC) Per patient he was compliant with Depakote  but levels were undetectable, so questionable compliance due to underlying mental instability.  No more breakthrough seizures noted while in the hospital. Mildly elevated ammonia, likely due to Depakote , neurology suggesting adding L-carnitine 500 mg daily. -Restarting home dose -IV Ativan  as needed -Continue to monitor  Transaminitis Patient with history of alcohol abuse.  Transaminitis improving. -Continue to monitor  Alcohol abuse Alcohol levels undetectable. -CIWA protocol  Polysubstance abuse (HCC) UDS positive for cocaine, cannabinoid and barbiturates. -Counseling was provided   Subjective: Patient was seen and examined today.  No new concern.  No more seizure-like activity noted  Physical Exam: Vitals:   02/14/23 0930 02/14/23 0939 02/14/23 1200 02/14/23 1357  BP: 102/64  128/84 109/80  Pulse:  75 82 66  Resp:   18 19  Temp:    98.6 F (37 C)  TempSrc:      SpO2:  96% 97% 98%  Weight:      Height:       General.  Well-developed gentleman, in no acute distress. Pulmonary.  Lungs clear bilaterally, normal respiratory effort. CV.  Regular rate and rhythm, no JVD, rub or murmur. Abdomen.  Soft, nontender, nondistended, BS positive. CNS.  Alert and oriented .  No focal neurologic deficit. Extremities.  No edema, no cyanosis, pulses intact and symmetrical.   Data Reviewed: Prior data reviewed  Family Communication: No contact in chart.  Discussed with patient  Disposition: Status is: Inpatient Remains inpatient appropriate because: Severity of illness, need to go  to inpatient behavioral health  Planned Discharge Destination: Inpatient behavioral health  DVT prophylaxis.  Lovenox  Time spent: 44  minutes  This record has been created using Conservation officer, historic buildings. Errors have been sought and corrected,but may not always be located. Such creation errors do not reflect on the standard of care.   Author: Amaryllis Dare, MD 02/14/2023 3:33 PM  For on call review [email or web address].

## 2023-02-14 NOTE — Assessment & Plan Note (Signed)
Secondary to recurrent breakthrough seizures.  CK on admission was 17 322>>7997>>3868 -Continue with IV fluid for another day -Monitor CK

## 2023-02-14 NOTE — Assessment & Plan Note (Signed)
 Per patient he was compliant with Depakote  but levels were undetectable, so questionable compliance due to underlying mental instability.  No more breakthrough seizures noted while in the hospital. Mildly elevated ammonia, likely due to Depakote , neurology suggesting adding L-carnitine 500 mg daily. -Restarting home dose -IV Ativan  as needed -Continue to monitor

## 2023-02-15 ENCOUNTER — Inpatient Hospital Stay
Admission: RE | Admit: 2023-02-15 | Discharge: 2023-03-07 | DRG: 885 | Disposition: A | Discharge status: Home / Self Care | Payer: MEDICAID | Source: Other Acute Inpatient Hospital | Attending: Psychiatry | Admitting: Psychiatry

## 2023-02-15 ENCOUNTER — Other Ambulatory Visit: Payer: Self-pay

## 2023-02-15 ENCOUNTER — Encounter: Payer: Self-pay | Admitting: Psychiatry

## 2023-02-15 DIAGNOSIS — Z604 Social exclusion and rejection: Secondary | ICD-10-CM | POA: Diagnosis present

## 2023-02-15 DIAGNOSIS — F1410 Cocaine abuse, uncomplicated: Secondary | ICD-10-CM | POA: Diagnosis present

## 2023-02-15 DIAGNOSIS — F329 Major depressive disorder, single episode, unspecified: Secondary | ICD-10-CM | POA: Diagnosis present

## 2023-02-15 DIAGNOSIS — Z96651 Presence of right artificial knee joint: Secondary | ICD-10-CM | POA: Diagnosis present

## 2023-02-15 DIAGNOSIS — Z823 Family history of stroke: Secondary | ICD-10-CM | POA: Diagnosis not present

## 2023-02-15 DIAGNOSIS — R569 Unspecified convulsions: Secondary | ICD-10-CM | POA: Diagnosis not present

## 2023-02-15 DIAGNOSIS — Z5900 Homelessness unspecified: Secondary | ICD-10-CM | POA: Diagnosis not present

## 2023-02-15 DIAGNOSIS — Z79899 Other long term (current) drug therapy: Secondary | ICD-10-CM | POA: Diagnosis not present

## 2023-02-15 DIAGNOSIS — F419 Anxiety disorder, unspecified: Secondary | ICD-10-CM | POA: Diagnosis present

## 2023-02-15 DIAGNOSIS — Z91199 Patient's noncompliance with other medical treatment and regimen due to unspecified reason: Secondary | ICD-10-CM

## 2023-02-15 DIAGNOSIS — R45851 Suicidal ideations: Secondary | ICD-10-CM | POA: Diagnosis present

## 2023-02-15 DIAGNOSIS — T426X6A Underdosing of other antiepileptic and sedative-hypnotic drugs, initial encounter: Secondary | ICD-10-CM | POA: Diagnosis present

## 2023-02-15 DIAGNOSIS — Z87891 Personal history of nicotine dependence: Secondary | ICD-10-CM

## 2023-02-15 DIAGNOSIS — K219 Gastro-esophageal reflux disease without esophagitis: Secondary | ICD-10-CM | POA: Diagnosis present

## 2023-02-15 DIAGNOSIS — G4700 Insomnia, unspecified: Secondary | ICD-10-CM | POA: Diagnosis not present

## 2023-02-15 DIAGNOSIS — M6282 Rhabdomyolysis: Secondary | ICD-10-CM | POA: Diagnosis not present

## 2023-02-15 DIAGNOSIS — I471 Supraventricular tachycardia, unspecified: Secondary | ICD-10-CM | POA: Diagnosis present

## 2023-02-15 DIAGNOSIS — F315 Bipolar disorder, current episode depressed, severe, with psychotic features: Secondary | ICD-10-CM | POA: Diagnosis present

## 2023-02-15 DIAGNOSIS — R000 Tachycardia, unspecified: Secondary | ICD-10-CM | POA: Diagnosis not present

## 2023-02-15 DIAGNOSIS — F1490 Cocaine use, unspecified, uncomplicated: Secondary | ICD-10-CM | POA: Diagnosis not present

## 2023-02-15 DIAGNOSIS — I10 Essential (primary) hypertension: Secondary | ICD-10-CM | POA: Diagnosis present

## 2023-02-15 DIAGNOSIS — Z818 Family history of other mental and behavioral disorders: Secondary | ICD-10-CM

## 2023-02-15 DIAGNOSIS — Z96653 Presence of artificial knee joint, bilateral: Secondary | ICD-10-CM | POA: Diagnosis present

## 2023-02-15 DIAGNOSIS — G40909 Epilepsy, unspecified, not intractable, without status epilepticus: Secondary | ICD-10-CM | POA: Diagnosis present

## 2023-02-15 DIAGNOSIS — Z87442 Personal history of urinary calculi: Secondary | ICD-10-CM

## 2023-02-15 DIAGNOSIS — F331 Major depressive disorder, recurrent, moderate: Secondary | ICD-10-CM | POA: Diagnosis not present

## 2023-02-15 DIAGNOSIS — G2581 Restless legs syndrome: Secondary | ICD-10-CM | POA: Diagnosis present

## 2023-02-15 DIAGNOSIS — Z5982 Transportation insecurity: Secondary | ICD-10-CM

## 2023-02-15 LAB — THYROID PANEL WITH TSH
Free Thyroxine Index: 2.5 (ref 1.2–4.9)
T3 Uptake Ratio: 34 % (ref 24–39)
T4, Total: 7.3 ug/dL (ref 4.5–12.0)
TSH: 0.728 u[IU]/mL (ref 0.450–4.500)

## 2023-02-15 LAB — CK: Total CK: 3641 U/L — ABNORMAL HIGH (ref 49–397)

## 2023-02-15 MED ORDER — OLANZAPINE 5 MG PO TBDP: 5.0000 mg | ORAL_TABLET | Freq: Three times a day (TID) | ORAL | Status: DC | PRN

## 2023-02-15 MED ORDER — LEVOCARNITINE 1 GM/10ML PO SOLN
500.0000 mg | Freq: Every day | ORAL | Status: DC
  Administered 2023-02-16 – 2023-03-03 (×8): 500 mg via ORAL
  Filled 2023-02-15 (×20): qty 5

## 2023-02-15 MED ORDER — ACETAMINOPHEN 325 MG PO TABS
650.0000 mg | ORAL_TABLET | Freq: Four times a day (QID) | ORAL | Status: DC | PRN
  Administered 2023-02-15 – 2023-02-28 (×5): 650 mg via ORAL
  Filled 2023-02-15 (×5): qty 2

## 2023-02-15 MED ORDER — METOPROLOL SUCCINATE ER 25 MG PO TB24
50.0000 mg | ORAL_TABLET | Freq: Every day | ORAL | Status: DC
Start: 2023-02-16 — End: 2023-02-16
  Administered 2023-02-16: 50 mg via ORAL
  Filled 2023-02-15: qty 2

## 2023-02-15 MED ORDER — TAMSULOSIN HCL 0.4 MG PO CAPS
0.4000 mg | ORAL_CAPSULE | Freq: Every day | ORAL | Status: DC
Start: 2023-02-15 — End: 2023-02-16
  Administered 2023-02-15: 0.4 mg via ORAL
  Filled 2023-02-15: qty 1

## 2023-02-15 MED ORDER — ACETAMINOPHEN 650 MG RE SUPP: 650.0000 mg | Freq: Four times a day (QID) | RECTAL | Status: DC | PRN

## 2023-02-15 MED ORDER — ENSURE ENLIVE PO LIQD
237.0000 mL | Freq: Two times a day (BID) | ORAL | Status: DC
  Administered 2023-02-15: 237 mL via ORAL

## 2023-02-15 MED ORDER — LEVOCARNITINE 1 GM/10ML PO SOLN: 500.0000 mg | Freq: Every day | ORAL | Status: DC

## 2023-02-15 MED ORDER — DIVALPROEX SODIUM 250 MG PO DR TAB
500.0000 mg | DELAYED_RELEASE_TABLET | Freq: Two times a day (BID) | ORAL | Status: DC
  Administered 2023-02-15 – 2023-02-16 (×2): 500 mg via ORAL
  Filled 2023-02-15 (×2): qty 2

## 2023-02-15 MED ORDER — OLANZAPINE 10 MG IM SOLR: 5.0000 mg | Freq: Three times a day (TID) | INTRAMUSCULAR | Status: DC | PRN

## 2023-02-15 NOTE — Group Note (Signed)
 Date:  02/15/2023 Time:  9:02 PM  Group Topic/Focus:  Self Care:   The focus of this group is to help patients understand the importance of self-care in order to improve or restore emotional, physical, spiritual, interpersonal, and financial health.    Participation Level:  Did Not Attend  Participation Quality:   Did Not Attend  Affect:   Did Not Attend  Cognitive:   Did Not Attend  Insight: None  Engagement in Group:  None  Modes of Intervention:   Did Not Attend  Additional Comments:    Laymon ONEIDA Finder 02/15/2023, 9:02 PM

## 2023-02-15 NOTE — Plan of Care (Signed)
  Problem: Education: Goal: Utilization of techniques to improve thought processes will improve Outcome: Not Progressing Goal: Knowledge of the prescribed therapeutic regimen will improve Outcome: Not Progressing   Problem: Activity: Goal: Interest or engagement in leisure activities will improve Outcome: Not Progressing Goal: Imbalance in normal sleep/wake cycle will improve Outcome: Not Progressing   Problem: Coping: Goal: Coping ability will improve Outcome: Not Progressing Goal: Will verbalize feelings Outcome: Not Progressing   Problem: Health Behavior/Discharge Planning: Goal: Ability to make decisions will improve Outcome: Not Progressing Goal: Compliance with therapeutic regimen will improve Outcome: Not Progressing   Problem: Role Relationship: Goal: Will demonstrate positive changes in social behaviors and relationships Outcome: Not Progressing   Problem: Safety: Goal: Ability to disclose and discuss suicidal ideas will improve Outcome: Not Progressing Goal: Ability to identify and utilize support systems that promote safety will improve Outcome: Not Progressing   Problem: Self-Concept: Goal: Will verbalize positive feelings about self Outcome: Not Progressing Goal: Level of anxiety will decrease Outcome: Not Progressing   Problem: Education: Goal: Ability to make informed decisions regarding treatment will improve Outcome: Not Progressing   Problem: Coping: Goal: Coping ability will improve Outcome: Not Progressing   Problem: Health Behavior/Discharge Planning: Goal: Identification of resources available to assist in meeting health care needs will improve Outcome: Not Progressing   Problem: Medication: Goal: Compliance with prescribed medication regimen will improve Outcome: Not Progressing   Problem: Self-Concept: Goal: Ability to disclose and discuss suicidal ideas will improve Outcome: Not Progressing Goal: Will verbalize positive feelings  about self Outcome: Not Progressing Note: Patient is not on track and no change. Patient will work on increased adherence

## 2023-02-15 NOTE — Discharge Summary (Signed)
 Physician Discharge Summary   Patient: [name] MRN: [id number] DOB: 31-Jul-1960  Admit date:     02/12/2023  Discharge date: 02/15/23  Discharge Physician: Amaryllis Dare   PCP: Donnie Handing, PA   Recommendations at discharge:  Please obtain CK level on Friday Please repeat Depakote  levels next week Follow-up with primary care provider Psych follow-up to be determined by psychiatrist Follow-up with outpatient neurology  Discharge Diagnoses: Principal Problem:   Rhabdomyolysis Active Problems:   Moderate episode of recurrent major depressive disorder (HCC)   Seizures (HCC)   Sinus tachycardia   Transaminitis   Alcohol abuse   Polysubstance abuse (HCC)   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   [name] is a 63 y.o. male with medical history significant of hypertension, major depressive disorder with psychosis, substance abuse, history of suicidal ideations, seizures who presents to the emergency department for evaluation of suicidal thoughts, acute depression and breakthrough seizures despite compliance with his Depakote .  Patient states that he had about 4 episodes of back-to-back seizures last night despite being compliant with his Depakote .  He complained of left flank pain after recovering from the seizures.  On presentation vital normal, labs with bicarb of 17, BUN 29, AST 230, ALT 61, ALP 59, T. bili 2.1, anion gap 16, valproic acid  less than 10, CK 17,322, Toxicology screening negative for alcohol, salicylate and acetaminophen .  UDS positive for cocaine, barbiturates and cannabinoid. Patient was evaluated by psych and will be admitted to inpatient psychiatry after medical clearance.  2/3: CK improving at 7997, hypophosphatemia at 1.7, improving transaminitis. Consulting neurology as patient claimed that he was taking his Depakote  regularly.  Phosphorus is being  repleted Patient developed sinus tachycardia, restarting home Toprol .  2/4: CK continue to improve, currently at 3868, ammonia levels elevated at 40, per neurology likely due to Depakote  and they are suggesting adding L-carnitine 500 mg daily to help.  Will continue IV fluid for another day.  2/5: Remained hemodynamically stable with improving CK.  Thyroid  panel normal.  Patient is ready to be transferred to behavioral health to treat his suicidal thoughts and severe depression.  He should be encouraged p.o. hydration and recheck CK in 2 days.  Patient also need to monitor Depakote  levels to prevent further breakthrough seizures.  He will continue on L-carnitine to improved hyperammonia secondary to Depakote .  Patient is being discharged to behavioral health and will continue on current medications.  Assessment and Plan: * Rhabdomyolysis Secondary to recurrent breakthrough seizures.  CK on admission was 17 322>>7997>>3868>>3641 -Monitor CK-repeat on Friday -Encourage p.o. hydration  Moderate episode of recurrent major depressive disorder [facility]) Patient with history of bipolar disorder.  Remains suicidal. He was evaluated by psych and he will go to inpatient psych after medical clearance, likely by tomorrow. -Suicidal precautions  Sinus tachycardia Resolved after restarting home Toprol . Thyroid  panel normal -Continue with home Toprol   Seizures (HCC) Per patient he was compliant with Depakote  but levels were undetectable, so questionable compliance due to underlying mental instability.  No more breakthrough seizures noted while in the hospital. Mildly elevated ammonia, likely due to Depakote , neurology suggesting adding L-carnitine 500 mg daily. -Restarting home dose -IV Ativan  as needed -Continue to monitor  Transaminitis Patient with history of alcohol abuse.  Transaminitis improving. -Continue to monitor  Alcohol abuse Alcohol levels undetectable. -CIWA  protocol  Polysubstance abuse (HCC) UDS positive for cocaine, cannabinoid and  barbiturates. -Counseling was provided  Consultants: Psychiatry Procedures performed: None Disposition:  Behavioral health Diet recommendation:  Discharge Diet Orders (From admission, onward)     Start     Ordered   02/15/23 0000  Diet - low sodium heart healthy        02/15/23 1117           Regular diet DISCHARGE MEDICATION: Allergies as of 02/15/2023   No Known Allergies      Medication List     TAKE these medications    divalproex  500 MG DR tablet Commonly known as: DEPAKOTE  Take 1 tablet (500 mg total) by mouth 2 (two) times daily.   levOCARNitine  1 GM/10ML solution Commonly known as: CARNITOR  Take 5 mLs (500 mg total) by mouth daily.   lurasidone  40 MG Tabs tablet Commonly known as: LATUDA  Take 1 tablet (40 mg total) by mouth daily with supper.   metoprolol  succinate 50 MG 24 hr tablet Commonly known as: TOPROL -XL Take 1 tablet (50 mg total) by mouth daily.   tamsulosin  0.4 MG Caps capsule Commonly known as: FLOMAX  Take 1 capsule (0.4 mg total) by mouth daily after supper.   temazepam  15 MG capsule Commonly known as: RESTORIL  Take 1 capsule (15 mg total) by mouth at bedtime.   traMADol  50 MG tablet Commonly known as: ULTRAM  Take 50 mg by mouth every 6 (six) hours as needed.   venlafaxine  XR 75 MG 24 hr capsule Commonly known as: EFFEXOR -XR Take 1 capsule (75 mg total) by mouth daily after breakfast.        Follow-up Information     Donnie Handing, [place]. Schedule an appointment as soon as possible for a visit.   Specialty: Family Medicine Contact information: [address] [place] [address] [phone]                Discharge Exam: Fredricka Weights   02/12/23 1649  Weight: 74.8 kg   General.  Well-developed gentleman, in no acute distress. Pulmonary.  Lungs clear bilaterally, normal respiratory effort. CV.  Regular rate and rhythm, no JVD,  rub or murmur. Abdomen.  Soft, nontender, nondistended, BS positive. CNS.  Alert and oriented .  No focal neurologic deficit. Extremities.  No edema, no cyanosis, pulses intact and symmetrical. Psychiatry.  Judgment and insight appears normal.   Condition at discharge: stable  The results of significant diagnostics from this hospitalization (including imaging, microbiology, ancillary and laboratory) are listed below for reference.   Imaging Studies: No results found.  Microbiology: Results for orders placed or performed during the hospital encounter of 07/05/22  SARS Coronavirus 2 by RT PCR (hospital order, performed in [facility] hospital lab) *cepheid single result test* Anterior Nasal Swab     Status: None   Collection Time: 07/06/22 12:35 PM   Specimen: Anterior Nasal Swab  Result Value Ref Range Status   SARS Coronavirus 2 by RT PCR NEGATIVE NEGATIVE Final    Comment: (NOTE) SARS-CoV-2 target nucleic acids are NOT DETECTED.  The SARS-CoV-2 RNA is generally detectable in upper and lower respiratory specimens during the acute phase of infection. The lowest concentration of SARS-CoV-2 viral copies this assay can detect is 250 copies / mL. A negative result does not preclude SARS-CoV-2 infection and should not be used as the sole basis for treatment or other patient management decisions.  A negative result may occur with improper specimen collection / handling, submission of specimen other than nasopharyngeal swab, presence of viral mutation(s) within the areas targeted  by this assay, and inadequate number of viral copies (<250 copies / mL). A negative result must be combined with clinical observations, patient history, and epidemiological information.  Fact Sheet for Patients:   roadlaptop.co.za  Fact Sheet for Healthcare Providers: http://kim-miller.com/  This test is not yet approved or  cleared by the [place]  FDA  and has been authorized for detection and/or diagnosis of SARS-CoV-2 by FDA under an Emergency Use Authorization (EUA).  This EUA will remain in effect (meaning this test can be used) for the duration of the COVID-19 declaration under Section 564(b)(1) of the Act, 21 U.S.C. section 360bbb-3(b)(1), unless the authorization is terminated or revoked sooner.  Performed at [facility], [address] [place], [place] [address]     Labs: CBC: Recent Labs  Lab 02/12/23 1656 02/13/23 0229 02/13/23 0515 02/14/23 0346  WBC 10.1 7.6 7.8 5.5  HGB 15.2 12.8* 13.4 12.2*  HCT 44.6 37.4* 39.0 35.9*  MCV 88.1 89.7 89.0 90.0  PLT 216 156 158 157   Basic Metabolic Panel: Recent Labs  Lab 02/12/23 1656 02/13/23 0229 02/13/23 0515 02/14/23 0346  NA 135  --  138 139  K 4.4  --  3.9 4.1  CL 102  --  104 109  CO2 17*  --  22 21*  GLUCOSE 105*  --  104* 91  BUN 29*  --  30* 19  CREATININE 1.04 1.06 1.00 0.66  CALCIUM  9.8  --  9.2 8.7*  MG  --   --  2.1  --   PHOS  --   --  1.7*  --    Liver Function Tests: Recent Labs  Lab 02/12/23 1656 02/13/23 0515  AST 230* 196*  ALT 61* 60*  ALKPHOS 59 48  BILITOT 2.1* 1.1  PROT 7.8 6.5  ALBUMIN 4.3 3.3*   CBG: No results for input(s): GLUCAP in the last 168 hours.  Discharge time spent: greater than 30 minutes.  This record has been created using Conservation officer, historic buildings. Errors have been sought and corrected,but may not always be located. Such creation errors do not reflect on the standard of care.   Signed: Amaryllis Dare, MD [facility] 02/15/2023

## 2023-02-15 NOTE — Progress Notes (Signed)
   02/15/23 1600  Psych Admission Type (Psych Patients Only)  Admission Status Voluntary  Psychosocial Assessment  Patient Complaints Anxiety;Sadness  Eye Contact Fair  Facial Expression Anxious;Sad  Affect Appropriate to circumstance  Speech Logical/coherent  Interaction Assertive  Motor Activity Slow  Appearance/Hygiene In scrubs  Behavior Characteristics Cooperative  Mood Pleasant  Thought Process  Coherency WDL  Content WDL  Delusions None reported or observed  Perception WDL  Hallucination Auditory  Judgment WDL  Confusion WDL  Danger to Self  Current suicidal ideation? Denies  Danger to Others  Danger to Others None reported or observed

## 2023-02-15 NOTE — Progress Notes (Signed)
 Patient admitted voluntary to [facility] from medical floor at [facility] with diagnosis of major depressive disorder, recurrent, severe and suicidal ideation. Patient presents to unit via WC A&Ox4. He states  I had a couple seizures this past weekend and that triggered my suicidal thoughts. Patient's affect is depressed, speech is logical and coherent and thoughts are organized. He denies any recent stressors, homicidal ideations, and visual hallucinations and verbally contracts for safety on unit. He does admit to having suicidal ideation with a plan to hang himself but denies he has ever attempted suicide. He reports auditory hallucinations and states its his brothers voice telling him to kill himself. He reports that his brother committed suicide in front of him years ago. [name] reported having chronic pain in his knees that he takes Tylenol  for at home. Patient reports poor appetite with approx 10 lb weight loss the past 3 months. Denies incontinence and reports last BM yesterday.  Patient denies smoking or alcohol use, however he does admit to using cocaine last week. Patient reports living with an acquaintance and having no support system, never married and no kids.   Emotional support and reassurance provided throughout admission intake. Afterwards, oriented patient to unit, room and call light, reviewed POC with all questions answered and understanding verbalized.  Placed pt on high risk fall precautions due to a prior fall per patient. Denies any needs at this time. Q 15 minute safety checks in place per unit protocol.

## 2023-02-16 DIAGNOSIS — F315 Bipolar disorder, current episode depressed, severe, with psychotic features: Secondary | ICD-10-CM | POA: Diagnosis not present

## 2023-02-16 DIAGNOSIS — M6282 Rhabdomyolysis: Secondary | ICD-10-CM | POA: Diagnosis not present

## 2023-02-16 DIAGNOSIS — F1490 Cocaine use, unspecified, uncomplicated: Secondary | ICD-10-CM | POA: Diagnosis not present

## 2023-02-16 MED ORDER — LURASIDONE HCL 20 MG PO TABS
40.0000 mg | ORAL_TABLET | Freq: Every day | ORAL | Status: DC
  Administered 2023-02-16 – 2023-02-17 (×2): 40 mg via ORAL
  Filled 2023-02-16 (×2): qty 2

## 2023-02-16 MED ORDER — ADULT MULTIVITAMIN W/MINERALS CH
1.0000 | ORAL_TABLET | Freq: Every day | ORAL | Status: DC
  Administered 2023-02-16 – 2023-03-07 (×20): 1 via ORAL
  Filled 2023-02-16 (×20): qty 1

## 2023-02-16 MED ORDER — METOPROLOL SUCCINATE ER 25 MG PO TB24
50.0000 mg | ORAL_TABLET | Freq: Every day | ORAL | Status: DC
Start: 2023-02-17 — End: 2023-03-07
  Administered 2023-02-17 – 2023-03-06 (×14): 50 mg via ORAL
  Filled 2023-02-16 (×18): qty 2

## 2023-02-16 MED ORDER — TEMAZEPAM 7.5 MG PO CAPS
15.0000 mg | ORAL_CAPSULE | Freq: Every day | ORAL | Status: DC
  Administered 2023-02-16 – 2023-03-06 (×19): 15 mg via ORAL
  Filled 2023-02-16 (×19): qty 2

## 2023-02-16 MED ORDER — VENLAFAXINE HCL ER 75 MG PO CP24
75.0000 mg | ORAL_CAPSULE | Freq: Every day | ORAL | Status: DC
  Administered 2023-02-16 – 2023-02-23 (×8): 75 mg via ORAL
  Filled 2023-02-16 (×8): qty 1

## 2023-02-16 MED ORDER — MELATONIN 5 MG PO TABS
5.0000 mg | ORAL_TABLET | Freq: Every day | ORAL | Status: DC
  Administered 2023-02-20 – 2023-02-21 (×2): 5 mg via ORAL
  Filled 2023-02-16 (×19): qty 1

## 2023-02-16 MED ORDER — DIVALPROEX SODIUM 250 MG PO DR TAB
500.0000 mg | DELAYED_RELEASE_TABLET | Freq: Two times a day (BID) | ORAL | Status: DC
  Administered 2023-02-16 – 2023-02-28 (×24): 500 mg via ORAL
  Filled 2023-02-16 (×24): qty 2

## 2023-02-16 MED ORDER — TRAMADOL HCL 50 MG PO TABS
50.0000 mg | ORAL_TABLET | Freq: Four times a day (QID) | ORAL | Status: DC | PRN
  Administered 2023-02-16 – 2023-03-07 (×32): 50 mg via ORAL
  Filled 2023-02-16 (×33): qty 1

## 2023-02-16 MED ORDER — ENSURE ENLIVE PO LIQD
237.0000 mL | Freq: Three times a day (TID) | ORAL | Status: DC
  Administered 2023-02-16 – 2023-03-06 (×33): 237 mL via ORAL

## 2023-02-16 MED ORDER — TAMSULOSIN HCL 0.4 MG PO CAPS
0.4000 mg | ORAL_CAPSULE | Freq: Every day | ORAL | Status: DC
  Administered 2023-02-16 – 2023-03-06 (×19): 0.4 mg via ORAL
  Filled 2023-02-16 (×19): qty 1

## 2023-02-16 NOTE — H&P (Signed)
 Psychiatric Admission Assessment Adult  Patient Identification: [name] MRN:  [id number] Date of Evaluation:  02/16/2023 Chief Complaint:  MDD (major depressive disorder) [F32.9] Principal Diagnosis: Bipolar disorder, current episode depressed, severe, with psychotic features (HCC) Diagnosis:  Principal Problem:   Bipolar disorder, current episode depressed, severe, with psychotic features (HCC) Active Problems:   Cocaine use   Rhabdomyolysis   Seizures (HCC)  History of Present Illness: [name] is a 63 y.o. male who was initially medically admitted after presenting to [facility] on 02/12/23 for suicidal thoughts, seizures. Was noted to have elevated CK and admitted to hospitalist service for management of rhabdomyolysis.  Patient is transferred to geropsych unit after he is medically cleared.   Today chart reviewed, case discussed in multidisciplinary meeting, patient seen during rounds.today patient endorsed depressed mood, he feels helpless and hopeless.  Reports his sleep and appetite has been poor.  Patient continues to report active suicidal ideations w/ plan to hang himself as well as auditory hallucinations of his younger brother telling him to kill yourself  He denies any intention to harm himself on the unit.  Patient was provided with support and reassurance.  Patient reported that he is off his medicines because he ran out of prescription, he said he did not follow-up with psychiatrist upon discharge.  We discussed med compliance and treatment compliance.  Patient has also been using cocaine and cannabis.  He was encouraged to abstain from drug use, especially given his cardiac history.  Patient denies visual hallucinations.  Patient denies homicidal ideations.   Past Psychiatric History: Patient has history of bipolar disorder and substance abuse.  Patient has been noncompliant with medicines including Depakote .  Patient's Depakote  level done on 02/12/2023 was less than  10  Is the patient at risk to self? Yes.    Has the patient been a risk to self in the past 6 months? Yes.    Has the patient been a risk to self within the distant past? Yes.    Is the patient a risk to others? No.  Has the patient been a risk to others in the past 6 months? No.  Has the patient been a risk to others within the distant past? No.   Columbia Scale:  Flowsheet Row Admission (Current) from 02/15/2023 in [facility] BEHAVIORAL MEDICINE ED to Hosp-Admission (Discharged) from 02/12/2023 in [facility] 1C MEDICAL TELEMETRY Admission (Discharged) from 12/23/2022 in [facility] INPATIENT BEHAVIORAL MEDICINE  C-SSRS RISK CATEGORY High Risk High Risk High Risk        Prior Inpatient Therapy: Yes.     Prior Outpatient Therapy: Yes.     Alcohol Screening: 1. How often do you have a drink containing alcohol?: Never 2. How many drinks containing alcohol do you have on a typical day when you are drinking?: 1 or 2 3. How often do you have six or more drinks on one occasion?: Never AUDIT-C Score: 0 4. How often during the last year have you found that you were not able to stop drinking once you had started?: Never 5. How often during the last year have you failed to do what was normally expected from you because of drinking?: Never 6. How often during the last year have you needed a first drink in the morning to get yourself going after a heavy drinking session?: Never 7. How often during the last year have you had a feeling of guilt of remorse after drinking?: Never 8. How often during the  last year have you been unable to remember what happened the night before because you had been drinking?: Never 9. Have you or someone else been injured as a result of your drinking?: No 10. Has a relative or friend or a doctor or another health worker been concerned about your drinking or suggested you cut down?: No Alcohol Use Disorder Identification Test Final Score (AUDIT):  0 Substance Abuse History in the last 12 months:  Yes.   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 echo 5/17: EF 60-65%, no RWMA, LV dias fxn nl, LA mildly dilated, RV sys fxn nl, PASP nl, moderate sized circumferential pericardial effusion was identified, 2.12 cm around the LV free wall, <1 cm around the RV free wall. Features were not c/w tamponade physiology   PTSD (post-traumatic stress disorder)    Witnessed brother's suicide.   Restless leg syndrome    Seizures (HCC)    Syncope     Past Surgical History:  Procedure Laterality Date   APPENDECTOMY     CYSTOSCOPY WITH URETEROSCOPY AND STENT PLACEMENT     ESOPHAGOGASTRODUODENOSCOPY N/A 01/11/2016   Procedure: ESOPHAGOGASTRODUODENOSCOPY (EGD);  Surgeon: Jerrell Sol, MD;  Location: [facility] ENDOSCOPY;  Service: Endoscopy;  Laterality: N/A;   ESOPHAGOGASTRODUODENOSCOPY N/A 04/09/2020   Procedure: ESOPHAGOGASTRODUODENOSCOPY (EGD);  Surgeon: Therisa Bi, MD;  Location: [facility] ENDOSCOPY;  Service: Gastroenterology;  Laterality: N/A;   INCISION AND DRAINAGE ABSCESS Left 01/02/2018   Procedure: INCISION AND DRAINAGE LEFT KNEE;  Surgeon: Cleotilde Barrio, MD;  Location: [facility] ORS;  Service: Orthopedics;  Laterality: Left;   JOINT REPLACEMENT Right    TKR   KNEE ARTHROSCOPY Right 06/25/2014   Procedure: ARTHROSCOPY KNEE;  Surgeon: Barrio Cleotilde, MD;  Location: [facility] ORS;  Service: Orthopedics;  Laterality: Right;  partial arthroscopic medial menisectomy   LAPAROSCOPIC APPENDECTOMY N/A 06/02/2021    Procedure: APPENDECTOMY LAPAROSCOPIC;  Surgeon: Lane Shope, MD;  Location: [facility] ORS;  Service: General;  Laterality: N/A;   TOTAL KNEE ARTHROPLASTY Right 04/22/2015   Procedure: TOTAL KNEE ARTHROPLASTY;  Surgeon: Barrio Cleotilde, MD;  Location: [facility] ORS;  Service: Orthopedics;  Laterality: Right;   TOTAL KNEE ARTHROPLASTY Left 10/30/2017   Procedure: TOTAL KNEE ARTHROPLASTY;  Surgeon: Cleotilde Barrio, MD;  Location: [facility] ORS;  Service: Orthopedics;  Laterality: Left;   TOTAL KNEE REVISION Left 01/02/2018   Procedure: poly exchange of tibia and patella left knee;  Surgeon: Cleotilde Barrio, MD;  Location: [facility] ORS;  Service: Orthopedics;  Laterality: Left;   UMBILICAL HERNIA REPAIR  06/02/2021   Procedure: HERNIA REPAIR UMBILICAL ADULT;  Surgeon: Lane Shope, MD;  Location: [facility] ORS;  Service: General;;   Family History:  Family History  Problem Relation Age of Onset   CVA Mother        deceased at age 24   Depression Brother        Died by suicide at age 52   Family Psychiatric  History:  Patient has history of  bipolar disorder and substance abuse   Tobacco Screening:  Social History   Tobacco Use  Smoking Status Former   Current packs/day: 0.00   Average packs/day: 0.8 packs/day for 20.0 years (15.0 ttl pk-yrs)   Types: Cigarettes   Start date: 05/16/1964   Quit date: 05/16/1984   Years since quitting: 38.7  Smokeless Tobacco Never    BH Tobacco Counseling     Are you interested in Tobacco Cessation Medications?  N/A, patient does not use tobacco products Counseled patient on smoking cessation:  N/A, patient does not use tobacco products Reason Tobacco Screening Not Completed: Patient Refused Screening       Social History:  Social History   Substance and Sexual Activity  Alcohol Use Not Currently   Comment: rare     Social History   Substance and Sexual Activity  Drug Use Yes   Types: Marijuana, Cocaine   Comment: last use January 2025, cocaine     Additional Social History:  Patient reports he rents a room in a house                          Allergies:  No Known Allergies Lab Results:  Results for orders placed or performed during the hospital encounter of 02/12/23 (from the past 48 hours)  CK     Status: Abnormal   Collection Time: 02/15/23  4:23 AM  Result Value Ref Range   Total CK 3,641 (H) 49 - 397 U/L    Comment: Performed at [facility], [address] [place], [place] [address]    Blood Alcohol level:  Lab Results  Component Value Date   [facility] <10 02/12/2023   ETH <10 12/18/2022    Metabolic Disorder Labs:  Lab Results  Component Value Date   HGBA1C 5.5 12/29/2022   MPG 111.15 12/29/2022   MPG 111 04/16/2022   No results found for: PROLACTIN Lab Results  Component Value Date   CHOL 208 (H) 12/29/2022   TRIG 145 12/29/2022   HDL 33 (L) 12/29/2022   CHOLHDL 6.3 12/29/2022   VLDL 29 12/29/2022   LDLCALC 146 (H) 12/29/2022   LDLCALC 125 (H) 04/16/2022    Current Medications: Current Facility-Administered Medications  Medication Dose Route Frequency Provider Last Rate Last Admin   acetaminophen  (TYLENOL ) tablet 650 mg  650 mg Oral Q6H PRN Lee, Jacqueline Eun, NP   650 mg at 02/15/23 1714   Or   acetaminophen  (TYLENOL ) suppository 650 mg  650 mg Rectal Q6H PRN Lee, Jacqueline Eun, NP       divalproex  (DEPAKOTE ) DR tablet 500 mg  500 mg Oral BID Shaiann Mcmanamon, MD       feeding supplement (ENSURE ENLIVE / ENSURE PLUS) liquid 237 mL  237 mL Oral TID BM Proctor Carriker, MD   237 mL at 02/16/23 0900   levOCARNitine  (CARNITOR ) 1 GM/10ML solution 500 mg  500 mg Oral Daily Lee, Jacqueline Eun, NP   500 mg at 02/16/23 0900   lurasidone  (LATUDA ) tablet 40 mg  40 mg Oral Q supper Victoria Ruts, MD       NOREEN ON 02/17/2023] metoprolol  succinate (TOPROL -XL) 24 hr tablet 50 mg  50 mg Oral Daily Elivia Robotham, MD       multivitamin with minerals tablet 1 tablet  1 tablet Oral Daily  Lynel Forester, MD   1 tablet at 02/16/23 0900   OLANZapine  (ZYPREXA ) injection 5 mg  5 mg Intramuscular TID PRN  Lee, Jacqueline Eun, NP       OLANZapine  zydis (ZYPREXA ) disintegrating tablet 5 mg  5 mg Oral TID PRN Lee, Jacqueline Eun, NP       tamsulosin  (FLOMAX ) capsule 0.4 mg  0.4 mg Oral QPC supper Victoria Ruts, MD       temazepam  (RESTORIL ) capsule 15 mg  15 mg Oral QHS Victoria Ruts, MD       traMADol  (ULTRAM ) tablet 50 mg  50 mg Oral Q6H PRN Victoria Ruts, MD       venlafaxine  XR (EFFEXOR -XR) 24 hr capsule 75 mg  75 mg Oral QPC breakfast Bailley Guilford, MD   75 mg at 02/16/23 1101   PTA Medications: Medications Prior to Admission  Medication Sig Dispense Refill Last Dose/Taking   divalproex  (DEPAKOTE ) 500 MG DR tablet Take 1 tablet (500 mg total) by mouth 2 (two) times daily. 60 tablet 0    levOCARNitine  (CARNITOR ) 1 GM/10ML solution Take 5 mLs (500 mg total) by mouth daily.      lurasidone  (LATUDA ) 40 MG TABS tablet Take 1 tablet (40 mg total) by mouth daily with supper. 30 tablet 0    metoprolol  succinate (TOPROL -XL) 50 MG 24 hr tablet Take 1 tablet (50 mg total) by mouth daily. 30 tablet 0    tamsulosin  (FLOMAX ) 0.4 MG CAPS capsule Take 1 capsule (0.4 mg total) by mouth daily after supper.      temazepam  (RESTORIL ) 15 MG capsule Take 1 capsule (15 mg total) by mouth at bedtime. 30 capsule 0    traMADol  (ULTRAM ) 50 MG tablet Take 50 mg by mouth every 6 (six) hours as needed.      venlafaxine  XR (EFFEXOR -XR) 75 MG 24 hr capsule Take 1 capsule (75 mg total) by mouth daily after breakfast. 30 capsule 0     Musculoskeletal: Strength & Muscle Tone: within normal limits Gait & Station: normal Patient leans: N/A   Psychiatric Specialty Exam:   Presentation  General Appearance:  Appropriate for Environment   Eye Contact: Fair   Speech: Clear and Coherent   Speech Volume: Decreased   Handedness: Right     Mood and Affect  Mood: Depressed; Hopeless;  Anxious   Affect: Congruent; Constricted     Thought Process  Thought Processes: Goal Directed   Descriptions of Associations:Intact   Orientation:Full (Time, Place and Person)   Thought Content:Rumination; Abstract Reasoning   History of Schizophrenia/Schizoaffective disorder:No   Duration of Psychotic Symptoms:Few days   Hallucinations: AH Ideas of Reference:None   Suicidal Thoughts:Suicidal Thoughts: Yes, Active SI Active Intent and/or Plan: With Plan   Homicidal Thoughts:Homicidal Thoughts: No     Sensorium  Memory: Immediate Fair; Recent Fair   Judgment: Poor   Insight: Shallow     Executive Functions  Concentration: Fair   Attention Span: Fair   Recall: [employer] of Knowledge: Fair   Language: Fair     Psychomotor Activity  Psychomotor Activity: Psychomotor Activity: Decreased     Assets  Assets: Communication Skills; Desire for Improvement; Housing     Sleep  Sleep: Sleep: Fair       Physical Exam: Physical Exam Constitutional:      Appearance: Normal appearance.  HENT:     Head: Normocephalic and atraumatic.     Nose: No congestion.  Eyes:     Pupils: Pupils are equal, round, and reactive to light.  Cardiovascular:     Rate and Rhythm: Regular rhythm.  Pulmonary:     Effort: Pulmonary  effort is normal.  Skin:    General: Skin is warm.  Neurological:     General: No focal deficit present.     Mental Status: He is alert and oriented to person, place, and time.      Review of Systems  Constitutional:  Negative for chills and fever.  HENT:  Negative for hearing loss and sore throat.   Eyes:  Negative for blurred vision.  Respiratory:  Negative for cough and shortness of breath.   Cardiovascular:  Negative for chest pain and palpitations.  Gastrointestinal:  Negative for nausea and vomiting.  Psychiatric/Behavioral:  Positive for depression, hallucinations, substance abuse and suicidal ideas. The patient is  nervous/anxious.    There were no vitals taken for this visit. There is no height or weight on file to calculate BMI.  Treatment Plan Summary: Daily contact with patient to assess and evaluate symptoms and progress in treatment and Medication management  Observation Level/Precautions:  15 minute checks  Laboratory:  CBC Chemistry Profile UDS  Psychotherapy:    Medications:    Consultations:    Discharge Concerns:    Estimated LOS: 5-7 days  Other:     Physician Treatment Plan for Primary Diagnosis: Bipolar disorder, current episode depressed, severe, with psychotic features (HCC) Long Term Goal(s): Improvement in symptoms so as ready for discharge  Short Term Goals: Ability to identify changes in lifestyle to reduce recurrence of condition will improve, Ability to verbalize feelings will improve, Ability to disclose and discuss suicidal ideas, Ability to demonstrate self-control will improve, Ability to identify and develop effective coping behaviors will improve, Ability to maintain clinical measurements within normal limits will improve, Compliance with prescribed medications will improve, and Ability to identify triggers associated with substance abuse/mental health issues will improve  1.  Patient is admitted to locked unit precautions 2. Metoprolol  for SVT and HTN 3.  Will start  -Depakote  500 mg by mouth twice daily for mood stabilization and seizure d/o -Latuda  40 mg daily. To target psychotic symptoms -Melatonin 5 mg at bedtime for insomnia -Effexor  75 mg to help with depression   4.  Patient was encouraged to abstain from drug and alcohol use. 5.Patient was encouraged to attend group and work on coping strategies. 6.  Will consult social worker to help with a safe discharge plan 7.  Ck levels tomorrow AM   I certify that inpatient services furnished can reasonably be expected to improve the patient's condition.    Wenceslao Harries, MD 2/6/202512:09 PM

## 2023-02-16 NOTE — Group Note (Signed)
 Date:  02/16/2023 Time:  9:02 PM  Group Topic/Focus:  Identifying Needs:   The focus of this group is to help patients identify their personal needs that have been historically problematic and identify healthy behaviors to address their needs.    Participation Level:  Did Not Attend  Participation Quality:   Did Not Attend  Affect:   Did Not Attend  Cognitive:   Did Not Attend  Insight: None  Engagement in Group:  None  Modes of Intervention:  Education  Additional Comments:    [name] Finder 02/16/2023, 9:02 PM

## 2023-02-16 NOTE — Group Note (Signed)
 Recreation Therapy Group Note   Group Topic:Emotion Expression  Group Date: 02/16/2023 Start Time: 1500 End Time: 1600 Facilitators: Celestia Jeoffrey BRAVO, LRT, CTRS Location:  Dayroom  Group Description: Positivity Collage. LRT and patients discussed the importance of having a positive mindset and being happy. Patients received magazines, safety scissors, a glue stick and a piece of paper. Pts were encouraged to find images or words in the magazines that showed "happiness" or positivity to them. Pt shared their collage with the group once they were finished. LRT and pts discussed how it can be difficult to always have a positive mindset, especially when they have mental health challenges.   Goal Area(s) Addressed:  Pt will identify things associate with positivity. Pt will reduce negative thinking. Pt will identify a new coping skill of thinking positive thoughts.    Affect/Mood: N/A   Participation Level: Did not attend    Clinical Observations/Individualized Feedback: Patient did not attend group.   Plan: Continue to engage patient in RT group sessions 2-3x/week.   [address] LRT, CTRS 02/16/2023 5:20 PM

## 2023-02-16 NOTE — [facility] Suicide Risk Assessment (Signed)
 [facility] INPATIENT:  Family/Significant Other Suicide Prevention Education  Suicide Prevention Education:  Patient Refusal for Family/Significant Other Suicide Prevention Education: The patient [name] has refused to provide written consent for family/significant other to be provided Family/Significant Other Suicide Prevention Education during admission and/or prior to discharge.  Physician notified.  Lum JONETTA Croft 02/16/2023, 4:10 PM

## 2023-02-16 NOTE — Progress Notes (Signed)
   02/16/23 1100  Psych Admission Type (Psych Patients Only)  Admission Status Voluntary  Psychosocial Assessment  Patient Complaints Self-harm thoughts;Sadness;Anxiety  Eye Contact Fair  Facial Expression Anxious  Affect Depressed  Speech Logical/coherent  Interaction Isolative  Motor Activity  (WNL)  Appearance/Hygiene In scrubs  Behavior Characteristics Cooperative  Mood Sad  Aggressive Behavior  Effect No apparent injury  Thought Process  Coherency WDL  Content WDL  Delusions None reported or observed  Perception WDL  Hallucination None reported or observed  Judgment WDL  Confusion WDL  Danger to Self  Current suicidal ideation? Passive  Description of Suicide Plan hang self  Self-Injurious Behavior Self-injurious ideation with potentially lethal plan observed or expressed  Agreement Not to Harm Self Yes  Description of Agreement verbal

## 2023-02-16 NOTE — [facility] Suicide Risk Assessment (Signed)
 [facility] Admission Suicide Risk Assessment   Nursing information obtained from:  Patient Demographic factors:  Male, Caucasian, Unemployed Current Mental Status:  Suicidal ideation indicated by patient  Principal Problem: <principal problem not specified> Diagnosis:  Active Problems:   MDD (major depressive disorder)  Subjective Data: [name] is a 63 y.o. male who was initially medically admitted after presenting to [facility] on 02/12/23 for suicidal thoughts, seizures. Was noted to have elevated CK and admitted to hospitalist service for management of rhabdomyolysis.  Patient is transferred to geropsych unit after he is medically cleared. Patient continues to report active suicidal ideations w/ plan to hang himself as well as auditory hallucinations of his younger brother telling him to kill yourself  He denies any intention to harm himself on the unit.  Alcohol Use Disorder Identification Test Final Score (AUDIT): 0 The Alcohol Use Disorders Identification Test, Guidelines for Use in Primary Care, Second Edition.  World Science Writer [facility]). Score between 0-7:  no or low risk or alcohol related problems. Score between 8-15:  moderate risk of alcohol related problems. Score between 16-19:  high risk of alcohol related problems. Score 20 or above:  warrants further diagnostic evaluation for alcohol dependence and treatment.   CLINICAL FACTORS:   Depression:   Anhedonia Hopelessness Impulsivity Alcohol/Substance Abuse/Dependencies Previous Psychiatric Diagnoses and Treatments   Musculoskeletal: Strength & Muscle Tone: within normal limits Gait & Station: normal Patient leans: N/A  Psychiatric Specialty Exam:  Presentation  General Appearance:  Appropriate for Environment  Eye Contact: Fair  Speech: Clear and Coherent  Speech Volume: Decreased  Handedness: Right   Mood and Affect  Mood: Depressed; Hopeless; Anxious  Affect: Congruent;  Constricted   Thought Process  Thought Processes: Goal Directed  Descriptions of Associations:Intact  Orientation:Full (Time, Place and Person)  Thought Content:Rumination; Abstract Reasoning  History of Schizophrenia/Schizoaffective disorder:No  Duration of Psychotic Symptoms:Few days  Hallucinations: AH Ideas of Reference:None  Suicidal Thoughts:Suicidal Thoughts: Yes, Active SI Active Intent and/or Plan: With Plan  Homicidal Thoughts:Homicidal Thoughts: No   Sensorium  Memory: Immediate Fair; Recent Fair  Judgment: Poor  Insight: Shallow   Executive Functions  Concentration: Fair  Attention Span: Fair  Recall: [employer] of Knowledge: Fair  Language: Fair   Psychomotor Activity  Psychomotor Activity: Psychomotor Activity: Decreased   Assets  Assets: Communication Skills; Desire for Improvement; Housing   Sleep  Sleep: Sleep: Fair    Physical Exam: Physical Exam Constitutional:      Appearance: Normal appearance.  HENT:     Head: Normocephalic and atraumatic.     Nose: No congestion.  Eyes:     Pupils: Pupils are equal, round, and reactive to light.  Cardiovascular:     Rate and Rhythm: Regular rhythm.  Pulmonary:     Effort: Pulmonary effort is normal.  Skin:    General: Skin is warm.  Neurological:     General: No focal deficit present.     Mental Status: He is alert and oriented to person, place, and time.    Review of Systems  Constitutional:  Negative for chills and fever.  HENT:  Negative for hearing loss and sore throat.   Eyes:  Negative for blurred vision.  Respiratory:  Negative for cough and shortness of breath.   Cardiovascular:  Negative for chest pain and palpitations.  Gastrointestinal:  Negative for nausea and vomiting.  Psychiatric/Behavioral:  Positive for depression, hallucinations, substance abuse and suicidal ideas. The patient is nervous/anxious.    There  were no vitals taken for this visit.  There is no height or weight on file to calculate BMI.   COGNITIVE FEATURES THAT CONTRIBUTE TO RISK:  Thought constriction (tunnel vision)    SUICIDE RISK:   Moderate:  Frequent suicidal ideation with limited intensity, and duration, some specificity in terms of plans, no associated intent, good self-control,   PLAN OF CARE: Per H&P   I certify that inpatient services furnished can reasonably be expected to improve the patient's condition.   Wenceslao Harries, MD 02/16/2023, 11:41 AM

## 2023-02-16 NOTE — Group Note (Signed)
 Recreation Therapy Group Note   Group Topic:Relaxation  Group Date: 02/16/2023 Start Time: 1100 End Time: 1135 Facilitators: Celestia Jeoffrey BRAVO, LRT, CTRS Location:  Dayroom  Group Description: Chair Yoga. LRT and patients discussed the benefits of yoga and how it differs from strength exercises. LRT educated patients on the mental and physical benefits of yoga and deep breathing and how it can be used as a associate professor. LRT and patients followed along to a guided yoga session on the television that focused on all parts of the body, as well as deep breathing. Pt encouraged to stop movement at any time if they feel discomfort or pain.   Goal Area(s) Addressed: Patient will practice using relaxation technique. Patient will identify a new coping skill.  Patient will follow multistep directions to reduce anxiety and stress.   Affect/Mood: N/A   Participation Level: Did not attend    Clinical Observations/Individualized Feedback: Patient did not attend group.   Plan: Continue to engage patient in RT group sessions 2-3x/week.   Jeoffrey BRAVO Celestia, LRT, CTRS 02/16/2023 2:05 PM

## 2023-02-16 NOTE — Progress Notes (Signed)
 NUTRITION ASSESSMENT  Pt identified as at risk on the Malnutrition Screen Tool  INTERVENTION:  -Increase Ensure Enlive po to TID, each supplement provides 350 kcal and 20 grams of protein -MVI with minerals daily -Continue regular diet   NUTRITION DIAGNOSIS: Unintentional weight loss related to sub-optimal intake as evidenced by pt report.   Goal: Pt to meet >/= 90% of their estimated nutrition needs.  Monitor:  PO intake  Assessment:  Pt with medical history significant of hypertension, major depressive disorder with psychosis, substance abuse, history of suicidal ideations, seizures who presents to for evaluation of suicidal thoughts, acute depression and breakthrough seizures despite compliance with his Depakote    Pt with history of MDD and SI.   Pt currently on a regular diet. No meal completion data available to assess at this time.   Per RN notes, pt had a few seizures this past weekend which triggered suicidal thoughts PTA.   Reviewed wt hx; pt has experienced a 6.8% wt loss over the past 5 months, which is not significant for time frame. Per chart review, pt endorses poor appetite and a 10# wt loss over the past 3 months.   Medications reviewed.   Lab Results  Component Value Date   HGBA1C 5.5 12/29/2022   PTA DM medications are none.   Labs reviewed: CBGS: 151 (inpatient orders for glycemic control are none). Tox screen positive for barbituates, cocaine, and cannabinoid.     63 y.o. male  Height: Ht Readings from Last 1 Encounters:  02/12/23 5' 9 (1.753 m)    Weight: Wt Readings from Last 1 Encounters:  02/12/23 74.8 kg    Weight Hx: Wt Readings from Last 10 Encounters:  02/12/23 74.8 kg  12/23/22 81.2 kg  12/18/22 77.1 kg  12/18/22 81.6 kg  09/15/22 79.8 kg  09/15/22 80.3 kg  07/06/22 79.4 kg  07/05/22 79.8 kg  04/14/22 65.8 kg  07/17/21 77.1 kg    BMI:  Estimated body mass index is 24.37 kg/m as calculated from the following:   Height as  of 02/12/23: 5' 9 (1.753 m).   Weight as of 02/12/23: 74.8 kg.  BMI WDL.   Estimated Nutritional Needs: Kcal: 25-30 kcal/kg Protein: > 1 gram protein/kg Fluid: 1 ml/kcal  Diet Order:  Diet Order             Diet regular Room service appropriate? Yes; Fluid consistency: Thin  Diet effective now                  Pt is also offered choice of unit snacks mid-morning and mid-afternoon.  Pt is eating as desired.   Lab results and medications reviewed.   Margery ORN, RD, LDN, CDCES Registered Dietitian III Certified Diabetes Care and Education Specialist If unable to reach this RD, please use RD Inpatient group chat on secure chat between hours of 8am-4 pm daily

## 2023-02-16 NOTE — Progress Notes (Signed)
   02/15/23 2159  Psych Admission Type (Psych Patients Only)  Admission Status Voluntary  Psychosocial Assessment  Patient Complaints Anxiety;Sadness  Eye Contact Fair  Facial Expression Anxious;Sad  Affect Appropriate to circumstance  Speech Logical/coherent  Interaction Assertive  Motor Activity Slow  Appearance/Hygiene In scrubs  Behavior Characteristics Cooperative  Mood Pleasant  Aggressive Behavior  Effect No apparent injury  Thought Process  Coherency WDL  Content WDL  Delusions None reported or observed  Perception WDL  Hallucination Auditory  Judgment WDL  Confusion WDL  Danger to Self  Current suicidal ideation? Denies  Agreement Not to Harm Self Yes  Description of Agreement Verbal  Danger to Others  Danger to Others None reported or observed

## 2023-02-16 NOTE — Plan of Care (Signed)
  Problem: Education: Goal: Utilization of techniques to improve thought processes will improve Outcome: Progressing   Problem: Activity: Goal: Interest or engagement in leisure activities will improve Outcome: Progressing   Problem: Role Relationship: Goal: Will demonstrate positive changes in social behaviors and relationships Outcome: Progressing   Problem: Safety: Goal: Ability to disclose and discuss suicidal ideas will improve Outcome: Progressing   Problem: Coping: Goal: Coping ability will improve Outcome: Not Progressing Goal: Will verbalize feelings Outcome: Not Progressing   Problem: Health Behavior/Discharge Planning: Goal: Ability to make decisions will improve Outcome: Not Progressing   Problem: Self-Concept: Goal: Will verbalize positive feelings about self Outcome: Not Progressing

## 2023-02-16 NOTE — Plan of Care (Signed)
  Problem: Education: Goal: Utilization of techniques to improve thought processes will improve Outcome: Not Progressing Goal: Knowledge of the prescribed therapeutic regimen will improve Outcome: Not Progressing   Problem: Activity: Goal: Interest or engagement in leisure activities will improve Outcome: Not Progressing Goal: Imbalance in normal sleep/wake cycle will improve Outcome: Not Progressing   Problem: Coping: Goal: Coping ability will improve Outcome: Not Progressing Goal: Will verbalize feelings Outcome: Not Progressing   Problem: Health Behavior/Discharge Planning: Goal: Ability to make decisions will improve Outcome: Not Progressing Goal: Compliance with therapeutic regimen will improve Outcome: Not Progressing   Problem: Role Relationship: Goal: Will demonstrate positive changes in social behaviors and relationships Outcome: Not Progressing   Problem: Safety: Goal: Ability to disclose and discuss suicidal ideas will improve Outcome: Not Progressing Goal: Ability to identify and utilize support systems that promote safety will improve Outcome: Not Progressing   Problem: Self-Concept: Goal: Will verbalize positive feelings about self Outcome: Not Progressing Goal: Level of anxiety will decrease Outcome: Not Progressing   Problem: Education: Goal: Ability to make informed decisions regarding treatment will improve Outcome: Not Progressing   Problem: Coping: Goal: Coping ability will improve Outcome: Not Progressing   Problem: Health Behavior/Discharge Planning: Goal: Identification of resources available to assist in meeting health care needs will improve Outcome: Not Progressing   Problem: Medication: Goal: Compliance with prescribed medication regimen will improve Outcome: Not Progressing   Problem: Self-Concept: Goal: Ability to disclose and discuss suicidal ideas will improve Outcome: Not Progressing Goal: Will verbalize positive feelings  about self Outcome: Not Progressing Note: Patient is not on track and no change. Patient will work on increased adherence, work toward meeting this goal by 02/17/2023, and avoid flare triggers

## 2023-02-16 NOTE — Progress Notes (Signed)
   02/16/23 0600  15 Minute Checks  Location Bedroom  Visual Appearance Calm  Behavior Sleeping  Sleep (Behavioral Health Patients Only)  Calculate sleep? (Click Yes once per 24 hr at 0600 safety check) Yes  Documented sleep last 24 hours 9.5

## 2023-02-17 DIAGNOSIS — F315 Bipolar disorder, current episode depressed, severe, with psychotic features: Secondary | ICD-10-CM | POA: Diagnosis not present

## 2023-02-17 DIAGNOSIS — M6282 Rhabdomyolysis: Secondary | ICD-10-CM | POA: Diagnosis not present

## 2023-02-17 DIAGNOSIS — F1490 Cocaine use, unspecified, uncomplicated: Secondary | ICD-10-CM | POA: Diagnosis not present

## 2023-02-17 LAB — CK: Total CK: 841 U/L — ABNORMAL HIGH (ref 49–397)

## 2023-02-17 MED ORDER — HYDROXYZINE HCL 50 MG PO TABS
50.0000 mg | ORAL_TABLET | Freq: Every day | ORAL | Status: DC
Start: 2023-02-17 — End: 2023-02-22
  Administered 2023-02-17 – 2023-02-21 (×5): 50 mg via ORAL
  Filled 2023-02-17 (×5): qty 1

## 2023-02-17 NOTE — Group Note (Signed)
 Recreation Therapy Group Note   Group Topic:Relaxation  Group Date: 02/17/2023 Start Time: 1100 End Time: 1135 Facilitators: Celestia Jeoffrey BRAVO, LRT, CTRS Location:  Dayroom  Group Description: Meditation. LRT and patients discussed what they know about meditation and mindfulness. LRT played a Deep Breathing Meditation exercise script for patients to follow along to. LRT and patients discussed how meditation and deep breathing can be used as a coping skill post--discharge to help manage symptoms of stress.   Goal Area(s) Addressed: Patient will practice using relaxation technique. Patient will identify a new coping skill.  Patient will follow multistep directions to reduce anxiety and stress.   Affect/Mood: N/A   Participation Level: Did not attend    Clinical Observations/Individualized Feedback: Patient did not attend group.   Plan: Continue to engage patient in RT group sessions 2-3x/week.   Jeoffrey BRAVO Celestia, LRT, CTRS 02/17/2023 1:40 PM

## 2023-02-17 NOTE — Group Note (Signed)
 Recreation Therapy Group Note   Group Topic:General Recreation  Group Date: 02/17/2023 Start Time: 1500 End Time: 1600 Facilitators: Celestia Jeoffrey BRAVO, LRT, CTRS Location: Courtyard  Group Description: Outdoor Recreation. Patients had the option to play corn hole, ring toss, bowling or listening to music while outside in the courtyard getting fresh air and sunlight. LRT and patients discussed things that they enjoy doing in their free time outside of the hospital. LRT encouraged patients to drink water after being active and getting their heart rate up.   Goal Area(s) Addressed: Patient will identify leisure interests.  Patient will practice healthy decision making. Patient will engage in recreation activity.   Affect/Mood: N/A   Participation Level: Did not attend    Clinical Observations/Individualized Feedback: Patient did not attend group.   Plan: Continue to engage patient in RT group sessions 2-3x/week.   Jeoffrey BRAVO Celestia, LRT, CTRS 02/17/2023 5:35 PM

## 2023-02-17 NOTE — BH IP Treatment Plan (Signed)
 Interdisciplinary Treatment and Diagnostic Plan Update  02/17/2023 Time of Session: 10:11 AM [name] MRN: [id number]  Principal Diagnosis: Bipolar disorder, current episode depressed, severe, with psychotic features (HCC)  Secondary Diagnoses: Principal Problem:   Bipolar disorder, current episode depressed, severe, with psychotic features (HCC) Active Problems:   Cocaine use   Rhabdomyolysis   Seizures (HCC)   Current Medications:  Current Facility-Administered Medications  Medication Dose Route Frequency Provider Last Rate Last Admin   acetaminophen  (TYLENOL ) tablet 650 mg  650 mg Oral Q6H PRN Lee, Jacqueline Eun, NP   650 mg at 02/17/23 9196   Or   acetaminophen  (TYLENOL ) suppository 650 mg  650 mg Rectal Q6H PRN Lee, Jacqueline Eun, NP       divalproex  (DEPAKOTE ) DR tablet 500 mg  500 mg Oral BID Parmar, Meenakshi, MD   500 mg at 02/17/23 0804   feeding supplement (ENSURE ENLIVE / ENSURE PLUS) liquid 237 mL  237 mL Oral TID BM Parmar, Meenakshi, MD   237 mL at 02/17/23 0805   levOCARNitine  (CARNITOR ) 1 GM/10ML solution 500 mg  500 mg Oral Daily Lee, Jacqueline Eun, NP   500 mg at 02/17/23 9191   lurasidone  (LATUDA ) tablet 40 mg  40 mg Oral Q supper Parmar, Meenakshi, MD   40 mg at 02/16/23 1633   melatonin tablet 5 mg  5 mg Oral QHS Parmar, Meenakshi, MD       metoprolol  succinate (TOPROL -XL) 24 hr tablet 50 mg  50 mg Oral Daily Parmar, Meenakshi, MD   50 mg at 02/17/23 9195   multivitamin with minerals tablet 1 tablet  1 tablet Oral Daily Parmar, Meenakshi, MD   1 tablet at 02/17/23 9196   OLANZapine  (ZYPREXA ) injection 5 mg  5 mg Intramuscular TID PRN Lee, Jacqueline Eun, NP       OLANZapine  zydis (ZYPREXA ) disintegrating tablet 5 mg  5 mg Oral TID PRN Lee, Jacqueline Eun, NP       tamsulosin  (FLOMAX ) capsule 0.4 mg  0.4 mg Oral QPC supper Parmar, Meenakshi, MD   0.4 mg at 02/16/23 1633   temazepam  (RESTORIL ) capsule 15 mg  15 mg Oral QHS Parmar, Meenakshi, MD   15 mg at  02/16/23 2112   traMADol  (ULTRAM ) tablet 50 mg  50 mg Oral Q6H PRN Parmar, Meenakshi, MD   50 mg at 02/17/23 0919   venlafaxine  XR (EFFEXOR -XR) 24 hr capsule 75 mg  75 mg Oral QPC breakfast Parmar, Meenakshi, MD   75 mg at 02/17/23 9195   PTA Medications: Medications Prior to Admission  Medication Sig Dispense Refill Last Dose/Taking   divalproex  (DEPAKOTE ) 500 MG DR tablet Take 1 tablet (500 mg total) by mouth 2 (two) times daily. 60 tablet 0    levOCARNitine  (CARNITOR ) 1 GM/10ML solution Take 5 mLs (500 mg total) by mouth daily.      lurasidone  (LATUDA ) 40 MG TABS tablet Take 1 tablet (40 mg total) by mouth daily with supper. 30 tablet 0    metoprolol  succinate (TOPROL -XL) 50 MG 24 hr tablet Take 1 tablet (50 mg total) by mouth daily. 30 tablet 0    tamsulosin  (FLOMAX ) 0.4 MG CAPS capsule Take 1 capsule (0.4 mg total) by mouth daily after supper.      temazepam  (RESTORIL ) 15 MG capsule Take 1 capsule (15 mg total) by mouth at bedtime. 30 capsule 0    traMADol  (ULTRAM ) 50 MG tablet Take 50 mg by mouth every 6 (six) hours as needed.  venlafaxine  XR (EFFEXOR -XR) 75 MG 24 hr capsule Take 1 capsule (75 mg total) by mouth daily after breakfast. 30 capsule 0     Patient Stressors:    Patient Strengths:    Treatment Modalities: Medication Management, Group therapy, Case management,  1 to 1 session with clinician, Psychoeducation, Recreational therapy.   Physician Treatment Plan for Primary Diagnosis: Bipolar disorder, current episode depressed, severe, with psychotic features (HCC) Long Term Goal(s): Improvement in symptoms so as ready for discharge   Short Term Goals: Ability to identify changes in lifestyle to reduce recurrence of condition will improve Ability to verbalize feelings will improve Ability to disclose and discuss suicidal ideas Ability to demonstrate self-control will improve Ability to identify and develop effective coping behaviors will improve Ability to maintain  clinical measurements within normal limits will improve Compliance with prescribed medications will improve Ability to identify triggers associated with substance abuse/mental health issues will improve  Medication Management: Evaluate patient's response, side effects, and tolerance of medication regimen.  Therapeutic Interventions: 1 to 1 sessions, Unit Group sessions and Medication administration.  Evaluation of Outcomes: Not Progressing  Physician Treatment Plan for Secondary Diagnosis: Principal Problem:   Bipolar disorder, current episode depressed, severe, with psychotic features (HCC) Active Problems:   Cocaine use   Rhabdomyolysis   Seizures (HCC)  Long Term Goal(s): Improvement in symptoms so as ready for discharge   Short Term Goals: Ability to identify changes in lifestyle to reduce recurrence of condition will improve Ability to verbalize feelings will improve Ability to disclose and discuss suicidal ideas Ability to demonstrate self-control will improve Ability to identify and develop effective coping behaviors will improve Ability to maintain clinical measurements within normal limits will improve Compliance with prescribed medications will improve Ability to identify triggers associated with substance abuse/mental health issues will improve     Medication Management: Evaluate patient's response, side effects, and tolerance of medication regimen.  Therapeutic Interventions: 1 to 1 sessions, Unit Group sessions and Medication administration.  Evaluation of Outcomes: Not Progressing   RN Treatment Plan for Primary Diagnosis: Bipolar disorder, current episode depressed, severe, with psychotic features (HCC) Long Term Goal(s): Knowledge of disease and therapeutic regimen to maintain health will improve  Short Term Goals: Ability to remain free from injury will improve, Ability to verbalize frustration and anger appropriately will improve, Ability to demonstrate  self-control, Ability to participate in decision making will improve, Ability to verbalize feelings will improve, Ability to disclose and discuss suicidal ideas, Ability to identify and develop effective coping behaviors will improve, and Compliance with prescribed medications will improve  Medication Management: RN will administer medications as ordered by provider, will assess and evaluate patient's response and provide education to patient for prescribed medication. RN will report any adverse and/or side effects to prescribing provider.  Therapeutic Interventions: 1 on 1 counseling sessions, Psychoeducation, Medication administration, Evaluate responses to treatment, Monitor vital signs and CBGs as ordered, Perform/monitor CIWA, COWS, AIMS and Fall Risk screenings as ordered, Perform wound care treatments as ordered.  Evaluation of Outcomes: Not Progressing   LCSW Treatment Plan for Primary Diagnosis: Bipolar disorder, current episode depressed, severe, with psychotic features (HCC) Long Term Goal(s): Safe transition to appropriate next level of care at discharge, Engage patient in therapeutic group addressing interpersonal concerns.  Short Term Goals: Engage patient in aftercare planning with referrals and resources, Increase social support, Increase ability to appropriately verbalize feelings, Increase emotional regulation, Facilitate acceptance of mental health diagnosis and concerns, Facilitate patient progression through stages  of change regarding substance use diagnoses and concerns, Identify triggers associated with mental health/substance abuse issues, and Increase skills for wellness and recovery  Therapeutic Interventions: Assess for all discharge needs, 1 to 1 time with Social worker, Explore available resources and support systems, Assess for adequacy in community support network, Educate family and significant other(s) on suicide prevention, Complete Psychosocial Assessment,  Interpersonal group therapy.  Evaluation of Outcomes: Not Progressing   Progress in Treatment: Attending groups: No. Participating in groups: No. Taking medication as prescribed: Yes. Toleration medication: Yes. Family/Significant other contact made: No, will contact:  CSW  will contact if given permission Patient understands diagnosis: Yes. Discussing patient identified problems/goals with staff: Yes. Medical problems stabilized or resolved: Yes. Denies suicidal/homicidal ideation: Yes. Issues/concerns per patient self-inventory: No. Other: None   New problem(s) identified: No, Describe:  None identified   New Short Term/Long Term Goal(s):  elimination of symptoms of psychosis, medication management for mood stabilization; elimination of SI thoughts; development of comprehensive mental wellness plan.   Patient Goals:  I want to feel better, I want to stop having these flashbacks, get this anxiety under control   Discharge Plan or Barriers: CSW will assist with appropriate discharge planning   Reason for Continuation of Hospitalization: Anxiety Depression Medication stabilization Withdrawal symptoms  Estimated Length of Stay: 1 to 7 days   Last 3 [place] Suicide Severity Risk Score: Flowsheet Row Admission (Current) from 02/15/2023 in [facility] ED to Hosp-Admission (Discharged) from 02/12/2023 in [facility] 1C MEDICAL TELEMETRY Admission (Discharged) from 12/23/2022 in [facility] INPATIENT BE[facility]AL MEDICINE  C-SSRS RISK CATEGORY High Risk High Risk High Risk       Last PHQ 2/9 Scores:     No data to display          Scribe for Treatment Team: Lum JONETTA Croft, LCSWA 02/17/2023 10:45 AM
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
## 2023-02-17 NOTE — Progress Notes (Signed)
   02/16/23 2300  Psych Admission Type (Psych Patients Only)  Admission Status Voluntary  Psychosocial Assessment  Patient Complaints Anxiety;Depression  Eye Contact Fair  Facial Expression Flat;Sad  Affect Depressed  Speech Soft  Interaction Isolative  Motor Activity Slow  Appearance/Hygiene In scrubs  Behavior Characteristics Cooperative  Mood Depressed;Sad  Thought Process  Coherency WDL  Content WDL  Delusions None reported or observed  Perception WDL  Hallucination None reported or observed  Judgment WDL  Confusion WDL  Danger to Self  Current suicidal ideation? Passive  Agreement Not to Harm Self Yes  Description of Agreement verbal  Danger to Others  Danger to Others None reported or observed

## 2023-02-17 NOTE — Progress Notes (Signed)
   02/17/23 0545  15 Minute Checks  Location Bedroom  Visual Appearance Calm  Behavior Sleeping  Sleep (Behavioral Health Patients Only)  Calculate sleep? (Click Yes once per 24 hr at 0600 safety check) Yes  Documented sleep last 24 hours 15.25

## 2023-02-17 NOTE — Plan of Care (Signed)
 D: Pt alert and oriented. Pt reports experiencing anxiety/depression at this time. Pt reports experiencing 9/10 neck pain at this time, prn medication given. Pt denies experiencing any HI, or AVH at this time however, reports experiencing SI at this time. Pt reports not having a plan while here, contracts for safety while here, and states he can be safe while here. Pt states he will let staff know if anything changes.   A: Scheduled medications administered to pt, per MD orders. Support and encouragement provided. Frequent verbal contact made. Routine safety checks conducted q15 minutes.   R: No adverse drug reactions noted. Pt verbally contracts for safety at this time. Pt compliant with medications and treatment plan. Pt interacts minimally with others on the unit, mostly self isolating to his room with the exception of meals. Pt remains safe at this time. Plan of care ongoing.  Problem: Education: Goal: Utilization of techniques to improve thought processes will improve Outcome: Not Progressing   Problem: Activity: Goal: Interest or engagement in leisure activities will improve Outcome: Not Progressing

## 2023-02-17 NOTE — Progress Notes (Signed)
 [facility] MD Progress Note  02/17/2023  Furkan Keenum  MRN:  [id number]  ames Colon Krager is a 63 y.o. male who was initially medically admitted after presenting to [facility] on 02/12/23 for suicidal thoughts, seizures. Was noted to have elevated CK and admitted to hospitalist service for management of rhabdomyolysis. Patient is transferred to geropsych unit after he is medically cleared.   Subjective:   Today chart reviewed, case discussed in multidisciplinary meeting, patient seen during rounds and in treatment team meeting with RN and child psychotherapist.  Patient reports his goal is to get better.  Today patient endorses depressed mood, anhedonia, poor sleep.  Patient agrees to try Vistaril  50 mg at bedtime to help with sleep.  Patient reports no changes in his symptoms since yesterday.  Patient was provided with support and reassurance.  Patient continues to report suicidal thoughts, denies any intention to harm himself on the unit.  Patient was encouraged to attend group and work on coping strategies.   Principal Problem: Bipolar disorder, current episode depressed, severe, with psychotic features (HCC) Diagnosis: Principal Problem:   Bipolar disorder, current episode depressed, severe, with psychotic features (HCC) Active Problems:   Cocaine use   Rhabdomyolysis   Seizures (HCC)   Past Psychiatric History:  Patient has history of bipolar disorder and substance abuse.  Patient has been noncompliant with medicines including Depakote .  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  echo 5/17: EF 60-65%, no RWMA, LV dias fxn nl, LA mildly dilated, RV sys fxn nl, PASP nl, moderate sized circumferential pericardial effusion was identified, 2.12 cm around the LV free wall, <1 cm around the RV free wall. Features were not c/w tamponade physiology   PTSD (post-traumatic stress disorder)    Witnessed brother's suicide.   Restless leg syndrome    Seizures (HCC)    Syncope     Past Surgical History:  Procedure Laterality Date   APPENDECTOMY     CYSTOSCOPY WITH URETEROSCOPY AND STENT PLACEMENT     ESOPHAGOGASTRODUODENOSCOPY N/A 01/11/2016   Procedure: ESOPHAGOGASTRODUODENOSCOPY (EGD);  Surgeon: Jerrell Sol, MD;  Location: [facility] ENDOSCOPY;  Service: Endoscopy;  Laterality: N/A;   ESOPHAGOGASTRODUODENOSCOPY N/A 04/09/2020   Procedure: ESOPHAGOGASTRODUODENOSCOPY (EGD);  Surgeon: Therisa Bi, MD;  Location: [facility] ENDOSCOPY;  Service: Gastroenterology;  Laterality: N/A;   INCISION AND DRAINAGE ABSCESS Left 01/02/2018   Procedure: INCISION AND DRAINAGE LEFT KNEE;  Surgeon: Cleotilde Barrio, MD;  Location: [facility] ORS;  Service: Orthopedics;  Laterality: Left;   JOINT REPLACEMENT Right    TKR   KNEE ARTHROSCOPY Right 06/25/2014   Procedure: ARTHROSCOPY KNEE;  Surgeon: Barrio Cleotilde, MD;  Location: [facility] ORS;  Service: Orthopedics;  Laterality: Right;  partial arthroscopic medial menisectomy   LAPAROSCOPIC APPENDECTOMY N/A 06/02/2021   Procedure: APPENDECTOMY LAPAROSCOPIC;  Surgeon: Lane Shope, MD;  Location: [facility] ORS;  Service: General;  Laterality: N/A;   TOTAL KNEE ARTHROPLASTY Right 04/22/2015   Procedure: TOTAL KNEE ARTHROPLASTY;  Surgeon: Barrio  Cleotilde, MD;  Location: [facility] ORS;  Service: Orthopedics;  Laterality: Right;   TOTAL KNEE ARTHROPLASTY Left 10/30/2017   Procedure: TOTAL KNEE ARTHROPLASTY;  Surgeon: Cleotilde Barrio, MD;  Location: [facility] ORS;  Service: Orthopedics;  Laterality: Left;   TOTAL KNEE REVISION Left 01/02/2018   Procedure: poly exchange of tibia and patella  left knee;  Surgeon: Cleotilde Barrio, MD;  Location: [facility] ORS;  Service: Orthopedics;  Laterality: Left;   UMBILICAL HERNIA REPAIR  06/02/2021   Procedure: HERNIA REPAIR UMBILICAL ADULT;  Surgeon: Lane Shope, MD;  Location: [facility] ORS;  Service: General;;   Family History:  Family History  Problem Relation Age of Onset   CVA Mother        deceased at age 61   Depression Brother        Died by suicide at age 100    Social History:  Social History   Substance and Sexual Activity  Alcohol Use Not Currently   Comment: rare     Social History   Substance and Sexual Activity  Drug Use Yes   Types: Marijuana, Cocaine   Comment: last use January 2025, cocaine    Social History   Socioeconomic History   Marital status: Single    Spouse name: Not on file   Number of children: Not on file   Years of education: Not on file   Highest education level: Not on file  Occupational History   Not on file  Tobacco Use   Smoking status: Former    Current packs/day: 0.00    Average packs/day: 0.8 packs/day for 20.0 years (15.0 ttl pk-yrs)    Types: Cigarettes    Start date: 05/16/1964    Quit date: 05/16/1984    Years since quitting: 38.7   Smokeless tobacco: Never  Vaping Use   Vaping status: Never Used  Substance and Sexual Activity   Alcohol use: Not Currently    Comment: rare   Drug use: Yes    Types: Marijuana, Cocaine    Comment: last use January 2025, cocaine   Sexual activity: Not Currently  Other Topics Concern   Not on file  Social History Narrative   ** Merged History Encounter **       Social Drivers of Health   Financial Resource Strain: Not on file  Food Insecurity: No Food Insecurity (02/15/2023)   Hunger Vital Sign    Worried About Running Out of Food in the Last Year: Never true    Ran Out of Food in the Last Year: Never true  Recent Concern: Food Insecurity - Food Insecurity Present (12/20/2022)   Hunger Vital Sign    Worried About Running Out of Food in  the Last Year: Sometimes true    Ran Out of Food in the Last Year: Sometimes true  Transportation Needs: No Transportation Needs (02/15/2023)   PRAPARE - Administrator, Civil Service (Medical): No    Lack of Transportation (Non-Medical): No  Recent Concern: Transportation Needs - Unmet Transportation Needs (02/14/2023)   PRAPARE - Administrator, Civil Service (Medical): Yes    Lack of Transportation (Non-Medical): Yes  Physical Activity: Not on file  Stress: Not on file  Social Connections: Socially Isolated (02/15/2023)   Social Connection and Isolation Panel [NHANES]    Frequency of Communication with Friends and Family: Once a week    Frequency of Social Gatherings with Friends and Family: Once a week    Attends Religious Services: Never  Active Member of Clubs or Organizations: No    Attends Banker Meetings: Never    Marital Status: Never married                            Sleep: Poor  Appetite:  Fair  Current Medications: Current Facility-Administered Medications  Medication Dose Route Frequency Provider Last Rate Last Admin   acetaminophen  (TYLENOL ) tablet 650 mg  650 mg Oral Q6H PRN Lee, Jacqueline Eun, NP   650 mg at 02/17/23 9196   Or   acetaminophen  (TYLENOL ) suppository 650 mg  650 mg Rectal Q6H PRN Lee, Jacqueline Eun, NP       divalproex  (DEPAKOTE ) DR tablet 500 mg  500 mg Oral BID Victoria Ruts, MD   500 mg at 02/17/23 0804   feeding supplement (ENSURE ENLIVE / ENSURE PLUS) liquid 237 mL  237 mL Oral TID BM Victoria Ruts, MD   237 mL at 02/17/23 0805   levOCARNitine  (CARNITOR ) 1 GM/10ML solution 500 mg  500 mg Oral Daily Lee, Jacqueline Eun, NP   500 mg at 02/17/23 9191   lurasidone  (LATUDA ) tablet 40 mg  40 mg Oral Q supper Victoria Ruts, MD   40 mg at 02/16/23 1633   melatonin tablet 5 mg  5 mg Oral QHS Victoria Ruts, MD       metoprolol  succinate (TOPROL -XL) 24 hr tablet 50 mg  50 mg Oral Daily Victoria Ruts, MD   50 mg at 02/17/23 9195   multivitamin with minerals tablet 1 tablet  1 tablet Oral Daily Victoria Ruts, MD   1 tablet at 02/17/23 9196   OLANZapine  (ZYPREXA ) injection 5 mg  5 mg Intramuscular TID PRN Lee, Jacqueline Eun, NP       OLANZapine  zydis (ZYPREXA ) disintegrating tablet 5 mg  5 mg Oral TID PRN Lee, Jacqueline Eun, NP       tamsulosin  (FLOMAX ) capsule 0.4 mg  0.4 mg Oral QPC supper Victoria Ruts, MD   0.4 mg at 02/16/23 1633   temazepam  (RESTORIL ) capsule 15 mg  15 mg Oral QHS Victoria Ruts, MD   15 mg at 02/16/23 2112   traMADol  (ULTRAM ) tablet 50 mg  50 mg Oral Q6H PRN Victoria Ruts, MD   50 mg at 02/17/23 0919   venlafaxine  XR (EFFEXOR -XR) 24 hr capsule 75 mg  75 mg Oral QPC breakfast Victoria Ruts, MD   75 mg at 02/17/23 9195    Lab Results:  Results for orders placed or performed during the hospital encounter of 02/15/23 (from the past 48 hours)  CK     Status: Abnormal   Collection Time: 02/17/23  8:34 AM  Result Value Ref Range   Total CK 841 (H) 49 - 397 U/L    Comment: Performed at [facility], [address] [place], [place] [address]    Blood Alcohol level:  Lab Results  Component Value Date   [facility] <10 02/12/2023   ETH <10 12/18/2022    Metabolic Disorder Labs: Lab Results  Component Value Date   HGBA1C 5.5 12/29/2022   MPG 111.15 12/29/2022   MPG 111 04/16/2022   No results found for: PROLACTIN Lab Results  Component Value Date   CHOL 208 (H) 12/29/2022   TRIG 145 12/29/2022   HDL 33 (L) 12/29/2022   CHOLHDL 6.3 12/29/2022   VLDL 29 12/29/2022   LDLCALC 146 (H) 12/29/2022   LDLCALC 125 (H) 04/16/2022    Musculoskeletal:  Strength & Muscle Tone: within normal limits Gait & Station: normal Patient leans: N/A   Psychiatric Specialty Exam:   Presentation  General Appearance:  Appropriate for Environment   Eye Contact: Fair   Speech: Clear and Coherent   Speech Volume: Decreased    Handedness: Right     Mood and Affect  Mood: Depressed; Hopeless; Anxious   Affect: Congruent; Constricted     Thought Process  Thought Processes: Goal Directed   Descriptions of Associations:Intact   Orientation:Full (Time, Place and Person)   Thought Content:Rumination; Abstract Reasoning   History of Schizophrenia/Schizoaffective disorder:No   Duration of Psychotic Symptoms:Few days   Hallucinations: AH Ideas of Reference:None   Suicidal Thoughts:Suicidal Thoughts: Yes, Active SI Active Intent and/or Plan: With Plan   Homicidal Thoughts:Homicidal Thoughts: No     Sensorium  Memory: Immediate Fair; Recent Fair   Judgment: Poor   Insight: Shallow     Executive Functions  Concentration: Fair   Attention Span: Fair   Recall: [employer] of Knowledge: Fair   Language: Fair     Psychomotor Activity  Psychomotor Activity: Psychomotor Activity: Decreased     Assets  Assets: Communication Skills; Desire for Improvement; Housing     Sleep  Sleep: Sleep: Fair       Physical Exam: Physical Exam Constitutional:      Appearance: Normal appearance.  HENT:     Head: Normocephalic and atraumatic.     Nose: No congestion.  Eyes:     Pupils: Pupils are equal, round, and reactive to light.  Cardiovascular:     Rate and Rhythm: Regular rhythm.  Pulmonary:     Effort: Pulmonary effort is normal.  Skin:    General: Skin is warm.  Neurological:     General: No focal deficit present.     Mental Status: He is alert and oriented to person, place, and time.      Review of Systems  Constitutional:  Negative for chills and fever.  HENT:  Negative for hearing loss and sore throat.   Eyes:  Negative for blurred vision.  Respiratory:  Negative for cough and shortness of breath.   Cardiovascular:  Negative for chest pain and palpitations.  Gastrointestinal:  Negative for nausea and vomiting.  Psychiatric/Behavioral:  Positive for  depression, hallucinations, substance abuse and suicidal ideas. The patient is nervous/anxious.    Blood pressure (!) 135/91, pulse 76, temperature (!) 97.5 F (36.4 C), resp. rate 18, SpO2 98%. There is no height or weight on file to calculate BMI.   Treatment Plan Summary: 1.  Patient is admitted to locked unit precautions 2. Metoprolol  for SVT and HTN 3.  Will start  -Depakote  500 mg by mouth twice daily for mood stabilization and seizure d/o -Latuda  40 mg daily. To target psychotic symptoms -Melatonin 5 mg at bedtime for insomnia -Effexor  75 mg to help with depression  - Vistaril  50 mg at bedtime for Insomnia 4.  Patient was encouraged to abstain from drug and alcohol use. 5.Patient was encouraged to attend group and work on coping strategies. 6.  Will consult social worker to help with a safe discharge plan 7.  Ck down in 800s.  Patient encouraged to drink water and keep himself hydrated  Wenceslao Harries, MD

## 2023-02-18 DIAGNOSIS — F1490 Cocaine use, unspecified, uncomplicated: Secondary | ICD-10-CM | POA: Diagnosis not present

## 2023-02-18 DIAGNOSIS — F315 Bipolar disorder, current episode depressed, severe, with psychotic features: Secondary | ICD-10-CM | POA: Diagnosis not present

## 2023-02-18 DIAGNOSIS — M6282 Rhabdomyolysis: Secondary | ICD-10-CM | POA: Diagnosis not present

## 2023-02-18 MED ORDER — LURASIDONE HCL 20 MG PO TABS
80.0000 mg | ORAL_TABLET | Freq: Every day | ORAL | Status: DC
  Administered 2023-02-18 – 2023-03-06 (×17): 80 mg via ORAL
  Filled 2023-02-18 (×17): qty 4

## 2023-02-18 NOTE — Progress Notes (Signed)
   02/18/23 1400  Psych Admission Type (Psych Patients Only)  Admission Status Voluntary  Psychosocial Assessment  Patient Complaints Depression  Eye Contact Fair  Facial Expression Flat  Affect Depressed  Speech Soft  Interaction Minimal  Motor Activity Other (Comment) (WNL)  Appearance/Hygiene In scrubs  Behavior Characteristics Cooperative  Mood Depressed  Thought Process  Coherency WDL  Content WDL  Delusions None reported or observed  Perception WDL  Hallucination None reported or observed  Judgment WDL  Confusion WDL  Danger to Self  Current suicidal ideation? Passive  Agreement Not to Harm Self Yes  Description of Agreement verbal  Danger to Others  Danger to Others None reported or observed

## 2023-02-18 NOTE — Plan of Care (Signed)
  Problem: Activity: Goal: Interest or engagement in leisure activities will improve Outcome: Not Progressing Goal: Imbalance in normal sleep/wake cycle will improve Outcome: Not Progressing

## 2023-02-18 NOTE — Progress Notes (Signed)
 D: Pt alert and oriented. Pt rates depression 0/10 and anxiety 7/10.  Pt denies experiencing any SI/HI, or AVH at this time.   A: Scheduled medications administered to pt, per MD orders. Support and encouragement provided. Frequent verbal contact made. Routine safety checks conducted q15 minutes.   R: No adverse drug reactions noted. Pt verbally contracts for safety at this time. Pt complaint with medications and treatment plan. Pt interacts well with others on the unit. Pt remains safe at this time. Will continue to monitor.

## 2023-02-18 NOTE — Progress Notes (Signed)
 [facility] MD Progress Note  02/18/2023  Derl Abalos  MRN:  [id number]  [name] is a 63 y.o. male who was initially medically admitted after presenting to [facility] on 02/12/23 for suicidal thoughts, seizures. Was noted to have elevated CK and admitted to hospitalist service for management of rhabdomyolysis. Patient is transferred to geropsych unit after he is medically cleared.   Subjective:   Today chart reviewed, case discussed in multidisciplinary meeting, patient seen during rounds. Today patient endorses depressed mood, anhedonia, and auditory hallucinations.  Patient reports that he slept little better last night.   Patient was provided with support and reassurance.  Patient continues to report suicidal thoughts, denies any intention to harm himself on the unit.  Per staff report patient stays isolated to his room.  Patient was encouraged to attend group and work on coping strategies.  Patient agrees to increase the dose of Latuda  to help with mood and psychosis.  He denies side effects from medicine.   Principal Problem: Bipolar disorder, current episode depressed, severe, with psychotic features (HCC) Diagnosis: Principal Problem:   Bipolar disorder, current episode depressed, severe, with psychotic features (HCC) Active Problems:   Cocaine use   Rhabdomyolysis   Seizures (HCC)   Past Psychiatric History:  Patient has history of bipolar disorder and substance abuse.  Patient has been noncompliant with medicines including Depakote .  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 echo 5/17: EF 60-65%, no RWMA, LV dias fxn nl, LA mildly dilated, RV sys fxn nl, PASP nl, moderate sized circumferential pericardial effusion was identified, 2.12 cm around the LV free wall, <1 cm around the RV free wall. Features were not c/w tamponade physiology   PTSD (post-traumatic stress disorder)    Witnessed brother's suicide.   Restless leg syndrome    Seizures (HCC)    Syncope     Past Surgical History:  Procedure Laterality Date   APPENDECTOMY     CYSTOSCOPY WITH URETEROSCOPY AND STENT PLACEMENT     ESOPHAGOGASTRODUODENOSCOPY N/A 01/11/2016   Procedure: ESOPHAGOGASTRODUODENOSCOPY (EGD);  Surgeon: Jerrell Sol, MD;  Location: [facility] ENDOSCOPY;  Service: Endoscopy;  Laterality: N/A;   ESOPHAGOGASTRODUODENOSCOPY N/A 04/09/2020   Procedure: ESOPHAGOGASTRODUODENOSCOPY (EGD);  Surgeon: Therisa Bi, MD;  Location: [facility] ENDOSCOPY;  Service: Gastroenterology;  Laterality: N/A;   INCISION AND DRAINAGE ABSCESS Left 01/02/2018   Procedure: INCISION AND DRAINAGE LEFT KNEE;  Surgeon: Cleotilde Barrio, MD;  Location: [facility] ORS;  Service: Orthopedics;  Laterality: Left;   JOINT REPLACEMENT Right    TKR   KNEE ARTHROSCOPY Right 06/25/2014   Procedure: ARTHROSCOPY KNEE;  Surgeon: Barrio Cleotilde, MD;  Location: [facility] ORS;  Service: Orthopedics;  Laterality: Right;  partial arthroscopic medial menisectomy   LAPAROSCOPIC APPENDECTOMY N/A 06/02/2021   Procedure: APPENDECTOMY LAPAROSCOPIC;  Surgeon: Lane Shope, MD;  Location: [facility] ORS;  Service: General;  Laterality: N/A;   TOTAL KNEE ARTHROPLASTY Right 04/22/2015   Procedure: TOTAL KNEE ARTHROPLASTY;  Surgeon: Barrio  Cleotilde, MD;  Location: [facility] ORS;  Service: Orthopedics;  Laterality: Right;   TOTAL KNEE ARTHROPLASTY Left 10/30/2017   Procedure: TOTAL KNEE ARTHROPLASTY;  Surgeon: Cleotilde Barrio, MD;  Location: [facility] ORS;  Service: Orthopedics;  Laterality: Left;   TOTAL KNEE REVISION Left 01/02/2018   Procedure: poly exchange of  tibia and patella left knee;  Surgeon: Cleotilde Barrio, MD;  Location: [facility] ORS;  Service: Orthopedics;  Laterality: Left;   UMBILICAL HERNIA REPAIR  06/02/2021   Procedure: HERNIA REPAIR UMBILICAL ADULT;  Surgeon: Lane Shope, MD;  Location: [facility] ORS;  Service: General;;   Family History:  Family History  Problem Relation Age of Onset   CVA Mother        deceased at age 39   Depression Brother        Died by suicide at age 26    Social History:  Social History   Substance and Sexual Activity  Alcohol Use Not Currently   Comment: rare     Social History   Substance and Sexual Activity  Drug Use Yes   Types: Marijuana, Cocaine   Comment: last use January 2025, cocaine    Social History   Socioeconomic History   Marital status: Single    Spouse name: Not on file   Number of children: Not on file   Years of education: Not on file   Highest education level: Not on file  Occupational History   Not on file  Tobacco Use   Smoking status: Former    Current packs/day: 0.00    Average packs/day: 0.8 packs/day for 20.0 years (15.0 ttl pk-yrs)    Types: Cigarettes    Start date: 05/16/1964    Quit date: 05/16/1984    Years since quitting: 38.7   Smokeless tobacco: Never  Vaping Use   Vaping status: Never Used  Substance and Sexual Activity   Alcohol use: Not Currently    Comment: rare   Drug use: Yes    Types: Marijuana, Cocaine    Comment: last use January 2025, cocaine   Sexual activity: Not Currently  Other Topics Concern   Not on file  Social History Narrative   ** Merged History Encounter **       Social Drivers of Health   Financial Resource Strain: Not on file  Food Insecurity: No Food Insecurity (02/15/2023)   Hunger Vital Sign    Worried About Running Out of Food in the Last Year: Never true    Ran Out of Food in the Last Year: Never true  Recent Concern: Food Insecurity - Food Insecurity Present (12/20/2022)   Hunger Vital Sign    Worried About  Running Out of Food in the Last Year: Sometimes true    Ran Out of Food in the Last Year: Sometimes true  Transportation Needs: No Transportation Needs (02/15/2023)   PRAPARE - Administrator, Civil Service (Medical): No    Lack of Transportation (Non-Medical): No  Recent Concern: Transportation Needs - Unmet Transportation Needs (02/14/2023)   PRAPARE - Administrator, Civil Service (Medical): Yes    Lack of Transportation (Non-Medical): Yes  Physical Activity: Not on file  Stress: Not on file  Social Connections: Socially Isolated (02/15/2023)   Social Connection and Isolation Panel [NHANES]    Frequency of Communication with Friends and Family: Once a week    Frequency of Social Gatherings with Friends and Family: Once a week    Attends Religious Services: Never  Active Member of Clubs or Organizations: No    Attends Banker Meetings: Never    Marital Status: Never married                            Sleep: Improving  Appetite:  Fair  Current Medications: Current Facility-Administered Medications  Medication Dose Route Frequency Provider Last Rate Last Admin   acetaminophen  (TYLENOL ) tablet 650 mg  650 mg Oral Q6H PRN Lee, Jacqueline Eun, NP   650 mg at 02/17/23 2127   Or   acetaminophen  (TYLENOL ) suppository 650 mg  650 mg Rectal Q6H PRN Lee, Jacqueline Eun, NP       divalproex  (DEPAKOTE ) DR tablet 500 mg  500 mg Oral BID Akosua Constantine, MD   500 mg at 02/18/23 0920   feeding supplement (ENSURE ENLIVE / ENSURE PLUS) liquid 237 mL  237 mL Oral TID BM Victoria Ruts, MD   237 mL at 02/17/23 2128   hydrOXYzine  (ATARAX ) tablet 50 mg  50 mg Oral QHS Victoria Ruts, MD   50 mg at 02/17/23 2127   levOCARNitine  (CARNITOR ) 1 GM/10ML solution 500 mg  500 mg Oral Daily Lee, Jacqueline Eun, NP   500 mg at 02/18/23 9080   lurasidone  (LATUDA ) tablet 40 mg  40 mg Oral Q supper Victoria Ruts, MD   40 mg at 02/17/23 1630   melatonin  tablet 5 mg  5 mg Oral QHS Victoria Ruts, MD       metoprolol  succinate (TOPROL -XL) 24 hr tablet 50 mg  50 mg Oral Daily Victoria Ruts, MD   50 mg at 02/18/23 0920   multivitamin with minerals tablet 1 tablet  1 tablet Oral Daily Victoria Ruts, MD   1 tablet at 02/18/23 0920   OLANZapine  (ZYPREXA ) injection 5 mg  5 mg Intramuscular TID PRN Lee, Jacqueline Eun, NP       OLANZapine  zydis (ZYPREXA ) disintegrating tablet 5 mg  5 mg Oral TID PRN Lee, Jacqueline Eun, NP       tamsulosin  (FLOMAX ) capsule 0.4 mg  0.4 mg Oral QPC supper Victoria Ruts, MD   0.4 mg at 02/17/23 1630   temazepam  (RESTORIL ) capsule 15 mg  15 mg Oral QHS Victoria Ruts, MD   15 mg at 02/17/23 2128   traMADol  (ULTRAM ) tablet 50 mg  50 mg Oral Q6H PRN Victoria Ruts, MD   50 mg at 02/17/23 0919   venlafaxine  XR (EFFEXOR -XR) 24 hr capsule 75 mg  75 mg Oral QPC breakfast Victoria Ruts, MD   75 mg at 02/18/23 9079    Lab Results:  Results for orders placed or performed during the hospital encounter of 02/15/23 (from the past 48 hours)  CK     Status: Abnormal   Collection Time: 02/17/23  8:34 AM  Result Value Ref Range   Total CK 841 (H) 49 - 397 U/L    Comment: Performed at [facility], [address] [place], [place] [address]    Blood Alcohol level:  Lab Results  Component Value Date   [facility] <10 02/12/2023   ETH <10 12/18/2022    Metabolic Disorder Labs: Lab Results  Component Value Date   HGBA1C 5.5 12/29/2022   MPG 111.15 12/29/2022   MPG 111 04/16/2022   No results found for: PROLACTIN Lab Results  Component Value Date   CHOL 208 (H) 12/29/2022   TRIG 145 12/29/2022   HDL 33 (L) 12/29/2022   CHOLHDL 6.3  12/29/2022   VLDL 29 12/29/2022   LDLCALC 146 (H) 12/29/2022   LDLCALC 125 (H) 04/16/2022    Musculoskeletal: Strength & Muscle Tone: within normal limits Gait & Station: normal Patient leans: N/A   Psychiatric Specialty Exam:   Presentation  General  Appearance:  Appropriate for Environment   Eye Contact: Fair   Speech: Clear and Coherent   Speech Volume: Decreased   Handedness: Right     Mood and Affect  Mood: Depressed; Hopeless; Anxious   Affect: Congruent; Constricted     Thought Process  Thought Processes: Goal Directed   Descriptions of Associations:Intact   Orientation:Full (Time, Place and Person)   Thought Content: Abstract Reasoning   History of Schizophrenia/Schizoaffective disorder:No   Duration of Psychotic Symptoms:Few days   Hallucinations: AH Ideas of Reference:None   Suicidal Thoughts:Suicidal Thoughts: Yes, Active SI Active Intent and/or Plan: With Plan   Homicidal Thoughts:Homicidal Thoughts: No     Sensorium  Memory: Immediate Fair; Recent Fair   Judgment: Poor   Insight: Shallow     Executive Functions  Concentration: Fair   Attention Span: Fair   Recall: [employer] of Knowledge: Fair   Language: Fair     Psychomotor Activity  Psychomotor Activity: Psychomotor Activity: Decreased     Assets  Assets: Communication Skills; Desire for Improvement; Housing     Sleep  Sleep: Sleep: Fair       Physical Exam: Physical Exam Constitutional:      Appearance: Normal appearance.  HENT:     Head: Normocephalic and atraumatic.     Nose: No congestion.  Eyes:     Pupils: Pupils are equal, round, and reactive to light.  Cardiovascular:     Rate and Rhythm: Regular rhythm.  Pulmonary:     Effort: Pulmonary effort is normal.  Skin:    General: Skin is warm.  Neurological:     General: No focal deficit present.     Mental Status: He is alert and oriented to person, place, and time.      Review of Systems  Constitutional:  Negative for chills and fever.  HENT:  Negative for hearing loss and sore throat.   Eyes:  Negative for blurred vision.  Respiratory:  Negative for cough and shortness of breath.   Cardiovascular:  Negative for chest pain and  palpitations.  Gastrointestinal:  Negative for nausea and vomiting.  Psychiatric/Behavioral:  Positive for depression, hallucinations, substance abuse and suicidal ideas. The patient is nervous/anxious.    Blood pressure (!) 124/94, pulse 75, temperature 97.9 F (36.6 C), resp. rate 17, SpO2 93%. There is no height or weight on file to calculate BMI.   Treatment Plan Summary: 1.  Patient is admitted to locked unit precautions 2. Metoprolol  for SVT and HTN 3.  Will start  -Depakote  500 mg by mouth twice daily for mood stabilization and seizure d/o -Increase the dose of Latuda  to 80 mg daily.  02/18/2023 -Melatonin 5 mg at bedtime for insomnia -Effexor  75 mg to help with depression  - Vistaril  50 mg at bedtime for Insomnia 4.  Patient was encouraged to abstain from drug and alcohol use. 5.Patient was encouraged to attend group and work on coping strategies. 6.  Will consult social worker to help with a safe discharge plan 7.  Ck down in 800s.  Patient encouraged to drink water and keep himself hydrated, will repeat level tomorrow  Wenceslao Harries, MD

## 2023-02-19 DIAGNOSIS — M6282 Rhabdomyolysis: Secondary | ICD-10-CM | POA: Diagnosis not present

## 2023-02-19 DIAGNOSIS — F1490 Cocaine use, unspecified, uncomplicated: Secondary | ICD-10-CM | POA: Diagnosis not present

## 2023-02-19 DIAGNOSIS — F315 Bipolar disorder, current episode depressed, severe, with psychotic features: Secondary | ICD-10-CM | POA: Diagnosis not present

## 2023-02-19 LAB — CK: Total CK: 226 U/L (ref 49–397)

## 2023-02-19 NOTE — Plan of Care (Signed)
  Problem: Education: Goal: Utilization of techniques to improve thought processes will improve Outcome: Progressing Goal: Knowledge of the prescribed therapeutic regimen will improve Outcome: Progressing   Problem: Activity: Goal: Interest or engagement in leisure activities will improve Outcome: Progressing Goal: Imbalance in normal sleep/wake cycle will improve Outcome: Progressing   

## 2023-02-19 NOTE — Progress Notes (Signed)
 [facility] MD Progress Note  02/19/2023  [name]  MRN:  [id number]  [name] is a 63 y.o. male who was initially medically admitted after presenting to [facility] on 02/12/23 for suicidal thoughts, seizures. Was noted to have elevated CK and admitted to hospitalist service for management of rhabdomyolysis. Patient is transferred to geropsych unit after he is medically cleared.   Subjective:   Today chart reviewed, case discussed in multidisciplinary meeting, patient seen during rounds.  Not much change in mental status since yesterday.  Staff reports that patient isolates himself to his room.  Today patient endorses depressed mood, anhedonia, and auditory hallucinations.  Patient reports that he hears his dead brother's voice telling the patient to kill himself.  Patient denies any intention to harm himself on the unit.  Patient was provided with support and reassurance.   Patient was encouraged to attend group and work on coping strategies.  Dose of Latuda  was increased to 80 mg yesterday.  He denies side effects from medicine.     Principal Problem: Bipolar disorder, current episode depressed, severe, with psychotic features (HCC) Diagnosis: Principal Problem:   Bipolar disorder, current episode depressed, severe, with psychotic features (HCC) Active Problems:   Cocaine use   Rhabdomyolysis   Seizures (HCC)   Past Psychiatric History:  Patient has history of bipolar disorder and substance abuse.  Patient has been noncompliant with medicines including Depakote .  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 echo 5/17: EF 60-65%, no RWMA, LV dias fxn nl, LA mildly dilated, RV sys fxn nl, PASP nl, moderate sized circumferential pericardial effusion was identified, 2.12 cm around the LV free wall, <1 cm around the RV free wall. Features were not c/w tamponade physiology   PTSD (post-traumatic stress disorder)    Witnessed brother's suicide.   Restless leg syndrome    Seizures (HCC)    Syncope     Past Surgical History:  Procedure Laterality Date   APPENDECTOMY     CYSTOSCOPY WITH URETEROSCOPY AND STENT PLACEMENT     ESOPHAGOGASTRODUODENOSCOPY N/A 01/11/2016   Procedure: ESOPHAGOGASTRODUODENOSCOPY (EGD);  Surgeon: Jerrell Sol, MD;  Location: [facility] ENDOSCOPY;  Service: Endoscopy;  Laterality: N/A;   ESOPHAGOGASTRODUODENOSCOPY N/A 04/09/2020   Procedure: ESOPHAGOGASTRODUODENOSCOPY (EGD);  Surgeon: Therisa Bi, MD;  Location: [facility] ENDOSCOPY;  Service: Gastroenterology;  Laterality: N/A;   INCISION AND DRAINAGE ABSCESS Left 01/02/2018   Procedure: INCISION AND DRAINAGE LEFT KNEE;  Surgeon: Cleotilde Barrio, MD;  Location: [facility] ORS;  Service: Orthopedics;  Laterality: Left;   JOINT REPLACEMENT Right    TKR   KNEE ARTHROSCOPY Right 06/25/2014   Procedure: ARTHROSCOPY KNEE;  Surgeon: Barrio Cleotilde, MD;  Location: [facility] ORS;  Service: Orthopedics;  Laterality: Right;  partial arthroscopic medial menisectomy   LAPAROSCOPIC APPENDECTOMY N/A 06/02/2021   Procedure: APPENDECTOMY LAPAROSCOPIC;  Surgeon: Lane Shope, MD;  Location: [facility] ORS;  Service: General;  Laterality: N/A;   TOTAL KNEE ARTHROPLASTY Right 04/22/2015  Procedure: TOTAL KNEE ARTHROPLASTY;  Surgeon: Kayla Pinal, MD;  Location: [facility] ORS;  Service: Orthopedics;  Laterality: Right;   TOTAL KNEE ARTHROPLASTY Left 10/30/2017   Procedure: TOTAL KNEE ARTHROPLASTY;  Surgeon: Pinal Kayla, MD;  Location: [facility] ORS;  Service: Orthopedics;  Laterality: Left;   TOTAL KNEE REVISION Left 01/02/2018    Procedure: poly exchange of tibia and patella left knee;  Surgeon: Pinal Kayla, MD;  Location: [facility] ORS;  Service: Orthopedics;  Laterality: Left;   UMBILICAL HERNIA REPAIR  06/02/2021   Procedure: HERNIA REPAIR UMBILICAL ADULT;  Surgeon: Lane Shope, MD;  Location: [facility] ORS;  Service: General;;   Family History:  Family History  Problem Relation Age of Onset   CVA Mother        deceased at age 65   Depression Brother        Died by suicide at age 35    Social History:  Social History   Substance and Sexual Activity  Alcohol Use Not Currently   Comment: rare     Social History   Substance and Sexual Activity  Drug Use Yes   Types: Marijuana, Cocaine   Comment: last use January 2025, cocaine    Social History   Socioeconomic History   Marital status: Single    Spouse name: Not on file   Number of children: Not on file   Years of education: Not on file   Highest education level: Not on file  Occupational History   Not on file  Tobacco Use   Smoking status: Former    Current packs/day: 0.00    Average packs/day: 0.8 packs/day for 20.0 years (15.0 ttl pk-yrs)    Types: Cigarettes    Start date: 05/16/1964    Quit date: 05/16/1984    Years since quitting: 38.7   Smokeless tobacco: Never  Vaping Use   Vaping status: Never Used  Substance and Sexual Activity   Alcohol use: Not Currently    Comment: rare   Drug use: Yes    Types: Marijuana, Cocaine    Comment: last use January 2025, cocaine   Sexual activity: Not Currently  Other Topics Concern   Not on file  Social History Narrative   ** Merged History Encounter **       Social Drivers of Health   Financial Resource Strain: Not on file  Food Insecurity: No Food Insecurity (02/15/2023)   Hunger Vital Sign    Worried About Running Out of Food in the Last Year: Never true    Ran Out of Food in the Last Year: Never true  Recent Concern: Food Insecurity - Food Insecurity Present (12/20/2022)   Hunger Vital  Sign    Worried About Running Out of Food in the Last Year: Sometimes true    Ran Out of Food in the Last Year: Sometimes true  Transportation Needs: No Transportation Needs (02/15/2023)   PRAPARE - Administrator, Civil Service (Medical): No    Lack of Transportation (Non-Medical): No  Recent Concern: Transportation Needs - Unmet Transportation Needs (02/14/2023)   PRAPARE - Administrator, Civil Service (Medical): Yes    Lack of Transportation (Non-Medical): Yes  Physical Activity: Not on file  Stress: Not on file  Social Connections: Socially Isolated (02/15/2023)   Social Connection and Isolation Panel [NHANES]    Frequency of Communication with Friends and Family: Once a week    Frequency of Social Gatherings with Friends and Family: Once a week  Attends Religious Services: Never    Active Member of Clubs or Organizations: No    Attends Banker Meetings: Never    Marital Status: Never married                            Sleep: Improving  Appetite:  Fair  Current Medications: Current Facility-Administered Medications  Medication Dose Route Frequency Provider Last Rate Last Admin   acetaminophen  (TYLENOL ) tablet 650 mg  650 mg Oral Q6H PRN Lee, Jacqueline Eun, NP   650 mg at 02/17/23 2127   Or   acetaminophen  (TYLENOL ) suppository 650 mg  650 mg Rectal Q6H PRN Lee, Jacqueline Eun, NP       divalproex  (DEPAKOTE ) DR tablet 500 mg  500 mg Oral BID Milany Geck, MD   500 mg at 02/19/23 9071   feeding supplement (ENSURE ENLIVE / ENSURE PLUS) liquid 237 mL  237 mL Oral TID BM Victoria Ruts, MD   237 mL at 02/19/23 0929   hydrOXYzine  (ATARAX ) tablet 50 mg  50 mg Oral QHS Victoria Ruts, MD   50 mg at 02/18/23 2212   levOCARNitine  (CARNITOR ) 1 GM/10ML solution 500 mg  500 mg Oral Daily Lee, Jacqueline Eun, NP   500 mg at 02/19/23 9070   lurasidone  (LATUDA ) tablet 80 mg  80 mg Oral Q supper Victoria Ruts, MD   80 mg at 02/18/23  1725   melatonin tablet 5 mg  5 mg Oral QHS Satoya Feeley, MD       metoprolol  succinate (TOPROL -XL) 24 hr tablet 50 mg  50 mg Oral Daily Fordyce Lepak, MD   50 mg at 02/19/23 9070   multivitamin with minerals tablet 1 tablet  1 tablet Oral Daily Victoria Ruts, MD   1 tablet at 02/19/23 9071   OLANZapine  (ZYPREXA ) injection 5 mg  5 mg Intramuscular TID PRN Lee, Jacqueline Eun, NP       OLANZapine  zydis (ZYPREXA ) disintegrating tablet 5 mg  5 mg Oral TID PRN Lee, Jacqueline Eun, NP       tamsulosin  (FLOMAX ) capsule 0.4 mg  0.4 mg Oral QPC supper Victoria Ruts, MD   0.4 mg at 02/18/23 1726   temazepam  (RESTORIL ) capsule 15 mg  15 mg Oral QHS Victoria Ruts, MD   15 mg at 02/18/23 2212   traMADol  (ULTRAM ) tablet 50 mg  50 mg Oral Q6H PRN Victoria Ruts, MD   50 mg at 02/17/23 0919   venlafaxine  XR (EFFEXOR -XR) 24 hr capsule 75 mg  75 mg Oral QPC breakfast Victoria Ruts, MD   75 mg at 02/19/23 9071    Lab Results:  Results for orders placed or performed during the hospital encounter of 02/15/23 (from the past 48 hours)  CK     Status: None   Collection Time: 02/19/23  8:52 AM  Result Value Ref Range   Total CK 226 49 - 397 U/L    Comment: Performed at [facility], [address] [place], [place] [address]    Blood Alcohol level:  Lab Results  Component Value Date   [facility] <10 02/12/2023   ETH <10 12/18/2022    Metabolic Disorder Labs: Lab Results  Component Value Date   HGBA1C 5.5 12/29/2022   MPG 111.15 12/29/2022   MPG 111 04/16/2022   No results found for: PROLACTIN Lab Results  Component Value Date   CHOL 208 (H) 12/29/2022   TRIG 145 12/29/2022   HDL 33 (  L) 12/29/2022   CHOLHDL 6.3 12/29/2022   VLDL 29 12/29/2022   LDLCALC 146 (H) 12/29/2022   LDLCALC 125 (H) 04/16/2022    Musculoskeletal: Strength & Muscle Tone: within normal limits Gait & Station: normal Patient leans: N/A   Psychiatric Specialty Exam:   Presentation   General Appearance:  Appropriate for Environment   Eye Contact: Fair   Speech: Clear and Coherent   Speech Volume: Decreased   Handedness: Right     Mood and Affect  Mood: Depressed; Hopeless; Anxious   Affect: Congruent; Constricted     Thought Process  Thought Processes: Goal Directed   Descriptions of Associations:Intact   Orientation:Full (Time, Place and Person)   Thought Content: Abstract Reasoning   History of Schizophrenia/Schizoaffective disorder:No   Duration of Psychotic Symptoms:Few days   Hallucinations: AH Ideas of Reference:None   Suicidal Thoughts:Suicidal Thoughts: Yes, Active SI Active Intent and/or Plan: With Plan   Homicidal Thoughts:Homicidal Thoughts: No     Sensorium  Memory: Immediate Fair; Recent Fair   Judgment: Limited   Insight: Shallow     Executive Functions  Concentration: Fair   Attention Span: Fair   Recall: [employer] of Knowledge: Fair   Language: Fair     Psychomotor Activity  Psychomotor Activity: Psychomotor Activity: Decreased     Assets  Assets: Communication Skills; Desire for Improvement; Housing     Sleep  Sleep: Sleep: Fair       Physical Exam: Physical Exam Constitutional:      Appearance: Normal appearance.  HENT:     Head: Normocephalic and atraumatic.     Nose: No congestion.  Eyes:     Pupils: Pupils are equal, round, and reactive to light.  Cardiovascular:     Rate and Rhythm: Regular rhythm.  Pulmonary:     Effort: Pulmonary effort is normal.  Skin:    General: Skin is warm.  Neurological:     General: No focal deficit present.     Mental Status: He is alert and oriented to person, place, and time.      Review of Systems  Constitutional:  Negative for chills and fever.  HENT:  Negative for hearing loss and sore throat.   Eyes:  Negative for blurred vision.  Respiratory:  Negative for cough and shortness of breath.   Cardiovascular:  Negative for  chest pain and palpitations.  Gastrointestinal:  Negative for nausea and vomiting.  Psychiatric/Behavioral:  Positive for depression, hallucinations, substance abuse and suicidal ideas.    Blood pressure (!) 129/97, pulse 90, temperature 99 F (37.2 C), resp. rate 18, SpO2 97%. There is no height or weight on file to calculate BMI.   Treatment Plan Summary: 1.  Patient is admitted to locked unit precautions 2. Metoprolol  for SVT and HTN 3.  Continue on -Depakote  500 mg by mouth twice daily for mood stabilization and seizure d/o -Increased the dose of Latuda  to 80 mg daily.  02/18/2023 -Melatonin 5 mg at bedtime for insomnia -Effexor  75 mg to help with depression  - Vistaril  50 mg at bedtime for Insomnia 4.  Patient was encouraged to abstain from drug and alcohol use. 5.Patient was encouraged to attend group and work on coping strategies. 6.  Will consult social worker to help with a safe discharge plan 7.  Ck down to 226, WNL. Patient encouraged to drink water and keep himself hydrated   Wenceslao Harries, MD

## 2023-02-19 NOTE — Progress Notes (Signed)
 Accepted responsibitlity for nursing care @ 1400, pt presented with a flat and sad  affect, mood depressed and he endorsed depression with passive SI, I will do hurt myself why I'm here Patient being tired of living but, would not elaborate further. Pt denied having pain, he isolated to his room and was encouraged to send more time out in the milieu with others. Pt agreed but remained in his room. Pt appetite seems adequate he's compliant with his medication and without further complaints. Pt ius managed on q 15 min rounds.

## 2023-02-19 NOTE — [facility] Group Notes (Signed)
 LCSW Wellness Group Note   02/19/2023 1:30pm  Type of Group and Topic: Psychoeducational Group:  Wellness  Participation Level:  did not attend  Description of Group  Wellness group introduces the topic and its focus on developing healthy habits across the spectrum and its relationship to a decrease in hospital admissions.  Six areas of wellness are discussed: physical, social spiritual, intellectual, occupational, and emotional.  Patients are asked to consider their current wellness habits and to identify areas of wellness where they are interested and able to focus on improvements.    Therapeutic Goals Patients will understand components of wellness and how they can positively impact overall health.  Patients will identify areas of wellness where they have developed good habits. Patients will identify areas of wellness where they would like to make improvements.    Summary of Patient Progress     Therapeutic Modalities: Cognitive Behavioral Therapy Psychoeducation    Bridget Cordella Simmonds, LCSW

## 2023-02-20 NOTE — Progress Notes (Signed)

## 2023-02-20 NOTE — Group Note (Signed)
 Recreation Therapy Group Note   Group Topic:Other  Group Date: 02/20/2023 Start Time: 1400 End Time: 1500 Facilitators: Deatrice Factor, LRT, CTRS Location:  Dayroom  Group Description: Bingo. LRT and patients played multiple games of Bingo with music playing in the background. LRT and pts discussed how this could be a leisure interest and the importance of doing things they enjoy post-discharge. Pts won stress balls s Chief Financial Officer.    Goal Area(s) Addressed: Patient will identify leisure interests.  Patient will practice healthy decision making. Patient will engage in recreation activity.  Patient will increase communication.   Affect/Mood: N/A   Participation Level: Did not attend    Clinical Observations/Individualized Feedback: Patient did not attend group.   Plan: Continue to engage patient in RT group sessions 2-3x/week.   Deatrice Factor, LRT, CTRS 02/20/2023 5:49 PM

## 2023-02-20 NOTE — Plan of Care (Signed)
  Problem: Education: Goal: Utilization of techniques to improve thought processes will improve Outcome: Progressing Goal: Knowledge of the prescribed therapeutic regimen will improve Outcome: Progressing   Problem: Activity: Goal: Interest or engagement in leisure activities will improve Outcome: Not Progressing Goal: Imbalance in normal sleep/wake cycle will improve Outcome: Progressing   Problem: Coping: Goal: Coping ability will improve Outcome: Progressing   Problem: Role Relationship: Goal: Will demonstrate positive changes in social behaviors and relationships Outcome: Progressing   Problem: Safety: Goal: Ability to identify and utilize support systems that promote safety will improve Outcome: Progressing

## 2023-02-20 NOTE — Group Note (Signed)
 Date:  02/20/2023 Time:  1:14 AM  Group Topic/Focus:  Wrap-Up Group:   The focus of this group is to help patients review their daily goal of treatment and discuss progress on daily workbooks.    Participation Level:  Did Not Attend  Participation Quality:      Affect:      Cognitive:      Insight: None  Engagement in Group:  None  Modes of Intervention:      Additional Comments:    Rolland Cline 02/20/2023, 1:14 AM

## 2023-02-20 NOTE — Progress Notes (Signed)
 Patient is cooperative.  Flat affect.  Endorses anxiety, depression and passive SI.  Contracts for safety on the unit.  Denies HI and AVH.  Denies pain.  Reports he slept well.    Compliant with scheduled medications.  15 min checks in place for safety.  Patient insolates to room with the exception of meals. Minimal interaction with peers.

## 2023-02-20 NOTE — Progress Notes (Signed)
 [facility] MD Progress Note  02/20/2023 2:50 PM [name]  MRN:  [id number] [name] is a 63 y.o. male who was initially medically admitted after presenting to [facility] on 02/12/23 for suicidal thoughts, seizures. Was noted to have elevated CK and admitted to hospitalist service for management of rhabdomyolysis. Patient is transferred to geropsych unit after he is medically cleared.   Subjective:  Chart reviewed, case discussed in multidisciplinary meeting, patient seen during rounds.  Today on interview patient is noted to be resting in his bed.  He reports feeling depressed.  He rates his depression as 10 out of 10, 10 being the worst and his anxiety is 10 out of 10, 10 being the worst.  He denies having any panic attacks.  He reports struggling with suicidal thoughts but denies having any intention or plan.  He reports he needs help with sleep.  He has fair appetite.  He reports having auditory hallucinations, voices of a male telling him to kill himself.  He did tell the provider that he has no intentions to hurt himself and he will reach out to staff for safety.  He denies visual hallucinations.  Per nursing report no behavioral outbursts or problems on the unit.  Sleep: Fair  Appetite:  Fair  Past Psychiatric History: see h&P Family History:  Family History  Problem Relation Age of Onset   CVA Mother        deceased at age 66   Depression Brother        Died by suicide at age 6   Social History:  Social History   Substance and Sexual Activity  Alcohol Use Not Currently   Comment: rare     Social History   Substance and Sexual Activity  Drug Use Yes   Types: Marijuana, Cocaine   Comment: last use January 2025, cocaine    Social History   Socioeconomic History   Marital status: Single    Spouse name: Not on file   Number of children: Not on file   Years of education: Not on file   Highest education level: Not on file  Occupational History   Not on file  Tobacco  Use   Smoking status: Former    Current packs/day: 0.00    Average packs/day: 0.8 packs/day for 20.0 years (15.0 ttl pk-yrs)    Types: Cigarettes    Start date: 05/16/1964    Quit date: 05/16/1984    Years since quitting: 38.7   Smokeless tobacco: Never  Vaping Use   Vaping status: Never Used  Substance and Sexual Activity   Alcohol use: Not Currently    Comment: rare   Drug use: Yes    Types: Marijuana, Cocaine    Comment: last use January 2025, cocaine   Sexual activity: Not Currently  Other Topics Concern   Not on file  Social History Narrative   ** Merged History Encounter **       Social Drivers of Health   Financial Resource Strain: Not on file  Food Insecurity: No Food Insecurity (02/15/2023)   Hunger Vital Sign    Worried About Running Out of Food in the Last Year: Never true    Ran Out of Food in the Last Year: Never true  Recent Concern: Food Insecurity - Food Insecurity Present (12/20/2022)   Hunger Vital Sign    Worried About Running Out of Food in the Last Year: Sometimes true    Ran Out of Food in the Last Year:  Sometimes true  Transportation Needs: No Transportation Needs (02/15/2023)   PRAPARE - Administrator, Civil Service (Medical): No    Lack of Transportation (Non-Medical): No  Recent Concern: Transportation Needs - Unmet Transportation Needs (02/14/2023)   PRAPARE - Administrator, Civil Service (Medical): Yes    Lack of Transportation (Non-Medical): Yes  Physical Activity: Not on file  Stress: Not on file  Social Connections: Socially Isolated (02/15/2023)   Social Connection and Isolation Panel [NHANES]    Frequency of Communication with Friends and Family: Once a week    Frequency of Social Gatherings with Friends and Family: Once a week    Attends Religious Services: Never    Database administrator or Organizations: No    Attends Engineer, structural: Never    Marital Status: Never married   Past Medical History:   Past Medical History:  Diagnosis Date   Anxiety    Arthritis    knees and hands   Bipolar 1 disorder, depressed (HCC)    Bipolar disorder (HCC)    Depression    GERD (gastroesophageal reflux disease)    Hepatitis    HEP "C"   History of kidney stones    Hypertension    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 echo 5/17: EF 60-65%, no RWMA, LV dias fxn nl, LA mildly dilated, RV sys fxn nl, PASP nl, moderate sized circumferential pericardial effusion was identified, 2.12 cm around the LV free wall, <1 cm around the RV free wall. Features were not c/w tamponade physiology   PTSD (post-traumatic stress disorder)    Witnessed brother's suicide.   Restless leg syndrome    Seizures (HCC)    Syncope     Past Surgical History:  Procedure Laterality Date   APPENDECTOMY     CYSTOSCOPY WITH URETEROSCOPY AND STENT PLACEMENT     ESOPHAGOGASTRODUODENOSCOPY N/A 01/11/2016   Procedure: ESOPHAGOGASTRODUODENOSCOPY (EGD);  Surgeon: Baldo Bonds, MD;  Location: [facility] ENDOSCOPY;  Service: Endoscopy;  Laterality: N/A;   ESOPHAGOGASTRODUODENOSCOPY N/A 04/09/2020   Procedure: ESOPHAGOGASTRODUODENOSCOPY (EGD);  Surgeon: Luke Salaam, MD;  Location: [facility] ENDOSCOPY;  Service: Gastroenterology;  Laterality: N/A;   INCISION AND DRAINAGE ABSCESS Left 01/02/2018   Procedure: INCISION AND DRAINAGE LEFT KNEE;  Surgeon: Marlynn Singer, MD;  Location: [facility] ORS;  Service: Orthopedics;  Laterality: Left;   JOINT REPLACEMENT Right    TKR   KNEE ARTHROSCOPY Right 06/25/2014   Procedure: ARTHROSCOPY KNEE;  Surgeon: Marlynn Singer, MD;  Location: [facility] ORS;  Service: Orthopedics;  Laterality: Right;  partial arthroscopic medial menisectomy   LAPAROSCOPIC APPENDECTOMY N/A 06/02/2021   Procedure: APPENDECTOMY LAPAROSCOPIC;  Surgeon: Flynn Hylan, MD;  Location: [facility] ORS;  Service: General;  Laterality: N/A;   TOTAL KNEE ARTHROPLASTY Right 04/22/2015    Procedure: TOTAL KNEE ARTHROPLASTY;  Surgeon: Marlynn Singer, MD;  Location: [facility] ORS;  Service: Orthopedics;  Laterality: Right;   TOTAL KNEE ARTHROPLASTY Left 10/30/2017   Procedure: TOTAL KNEE ARTHROPLASTY;  Surgeon: Marlynn Singer, MD;  Location: [facility] ORS;  Service: Orthopedics;  Laterality: Left;   TOTAL KNEE REVISION Left 01/02/2018   Procedure: poly exchange of tibia and patella left knee;  Surgeon: Marlynn Singer, MD;  Location: [facility] ORS;  Service: Orthopedics;  Laterality: Left;   UMBILICAL HERNIA REPAIR  06/02/2021   Procedure: HERNIA REPAIR UMBILICAL ADULT;  Surgeon: Flynn Hylan, MD;  Location: [facility] ORS;  Service: General;;  Current Medications: Current Facility-Administered Medications  Medication Dose Route Frequency Provider Last Rate Last Admin   acetaminophen  (TYLENOL ) tablet 650 mg  650 mg Oral Q6H PRN Lee, Jacqueline Eun, NP   650 mg at 02/17/23 2127   Or   acetaminophen  (TYLENOL ) suppository 650 mg  650 mg Rectal Q6H PRN Lee, Jacqueline Eun, NP       divalproex  (DEPAKOTE ) DR tablet 500 mg  500 mg Oral BID Silas Drivers, MD   500 mg at 02/20/23 0933   feeding supplement (ENSURE ENLIVE / ENSURE PLUS) liquid 237 mL  237 mL Oral TID BM Silas Drivers, MD   237 mL at 02/20/23 1341   hydrOXYzine  (ATARAX ) tablet 50 mg  50 mg Oral QHS Silas Drivers, MD   50 mg at 02/19/23 2118   levOCARNitine  (CARNITOR ) 1 GM/10ML solution 500 mg  500 mg Oral Daily Lee, Jacqueline Eun, NP   500 mg at 02/20/23 4782   lurasidone  (LATUDA ) tablet 80 mg  80 mg Oral Q supper Silas Drivers, MD   80 mg at 02/19/23 1656   melatonin tablet 5 mg  5 mg Oral QHS Silas Drivers, MD       metoprolol  succinate (TOPROL -XL) 24 hr tablet 50 mg  50 mg Oral Daily Silas Drivers, MD   50 mg at 02/20/23 0932   multivitamin with minerals tablet 1 tablet  1 tablet Oral Daily Silas Drivers, MD   1 tablet at 02/20/23 0932   OLANZapine  (ZYPREXA ) injection 5 mg  5 mg Intramuscular TID PRN Lee,  Jacqueline Eun, NP       OLANZapine  zydis (ZYPREXA ) disintegrating tablet 5 mg  5 mg Oral TID PRN Lee, Jacqueline Eun, NP       tamsulosin  (FLOMAX ) capsule 0.4 mg  0.4 mg Oral QPC supper Silas Drivers, MD   0.4 mg at 02/19/23 1656   temazepam  (RESTORIL ) capsule 15 mg  15 mg Oral QHS Silas Drivers, MD   15 mg at 02/19/23 2118   traMADol  (ULTRAM ) tablet 50 mg  50 mg Oral Q6H PRN Silas Drivers, MD   50 mg at 02/17/23 0919   venlafaxine  XR (EFFEXOR -XR) 24 hr capsule 75 mg  75 mg Oral QPC breakfast Silas Drivers, MD   75 mg at 02/20/23 9562    Lab Results:  Results for orders placed or performed during the hospital encounter of 02/15/23 (from the past 48 hours)  CK     Status: None   Collection Time: 02/19/23  8:52 AM  Result Value Ref Range   Total CK 226 49 - 397 U/L    Comment: Performed at [facility], [address] [place], [place] [address]    Blood Alcohol level:  Lab Results  Component Value Date   [facility] <10 02/12/2023   ETH <10 12/18/2022    Metabolic Disorder Labs: Lab Results  Component Value Date   HGBA1C 5.5 12/29/2022   MPG 111.15 12/29/2022   MPG 111 04/16/2022   No results found for: "PROLACTIN" Lab Results  Component Value Date   CHOL 208 (H) 12/29/2022   TRIG 145 12/29/2022   HDL 33 (L) 12/29/2022   CHOLHDL 6.3 12/29/2022   VLDL 29 12/29/2022   LDLCALC 146 (H) 12/29/2022   LDLCALC 125 (H) 04/16/2022    Physical Findings: AIMS:  , ,  ,  ,    CIWA:    COWS:      Psychiatric Specialty Exam:  Presentation  General Appearance:  Appropriate for Environment; Casual  Eye Contact: Fair  Speech: Clear and Coherent  Speech Volume: Normal    Mood and Affect  Mood: Depressed  Affect: Depressed   Thought Process  Thought Processes: Coherent  Descriptions of Associations:Intact  Orientation:Full (Time, Place and Person)  Thought Content:Logical  Hallucinations:Hallucinations: Auditory; Command Description of  Command Hallucinations: Male voice telling him to kill himself but he is able to control his impulses and reach out to staff for safety  Ideas of Reference:None  Suicidal Thoughts:Suicidal Thoughts: Yes, Active SI Active Intent and/or Plan: Without Intent; Without Plan  Homicidal Thoughts:Homicidal Thoughts: No   Sensorium  Memory: Immediate Fair; Recent Fair; Remote Fair  Judgment: Impaired  Insight: Shallow   Executive Functions  Concentration: Fair  Attention Span: Fair  Recall: Fair  Fund of Knowledge: Fair  Language: Fair   Psychomotor Activity  Psychomotor Activity: Psychomotor Activity: Normal  Musculoskeletal: Strength & Muscle Tone: within normal limits Gait & Station: normal Assets  Assets: Manufacturing systems engineer; Desire for Improvement; Physical Health    Physical Exam: Physical Exam Vitals and nursing note reviewed.  HENT:     Head: Normocephalic.     Nose: Nose normal.     Mouth/Throat:     Mouth: Mucous membranes are moist.  Eyes:     Pupils: Pupils are equal, round, and reactive to light.  Cardiovascular:     Rate and Rhythm: Normal rate.  Pulmonary:     Breath sounds: Normal breath sounds.  Abdominal:     General: Bowel sounds are normal.  Skin:    General: Skin is warm.  Neurological:     General: No focal deficit present.     Mental Status: He is alert.    Review of Systems  Constitutional: Negative.   HENT: Negative.    Eyes: Negative.   Respiratory: Negative.    Cardiovascular: Negative.   Gastrointestinal: Negative.   Skin: Negative.   Neurological: Negative.    Blood pressure 136/83, pulse 87, temperature 97.6 F (36.4 C), resp. rate 18, SpO2 97%. There is no height or weight on file to calculate BMI.  Diagnosis: Principal Problem:   Bipolar disorder, current episode depressed, severe, with psychotic features (HCC) Active Problems:   Cocaine use   Rhabdomyolysis   Seizures (HCC)   PLAN: Safety and  Monitoring:  -- Voluntary admission to inpatient psychiatric unit for safety, stabilization and treatment  -- Daily contact with patient to assess and evaluate symptoms and progress in treatment  -- Patient's case to be discussed in multi-disciplinary team meeting  -- Observation Level : q15 minute checks  -- Vital signs:  q12 hours  -- Precautions: suicide, elopement, and assault -- Encouraged patient to participate in unit milieu and in scheduled group therapies  2. Psychiatric Diagnoses and Treatment:    Depakote  500 mg by mouth twice daily for mood stabilization and seizure d/o -Continue Latuda  to 80 mg daily.  Dose increased 02/18/2023 -Melatonin 5 mg at bedtime for insomnia -Effexor  75 mg to help with depression  - Vistaril  50 mg at bedtime for Insomnia     3. Medical Issues Being Addressed:  Metoprolol  for SVT and HTN   Ck down to 226, WNL. Patient encouraged to drink water and keep himself hydrated  4. Discharge Planning:   -- Social work and case management to assist with discharge planning and identification of hospital follow-up needs prior to discharge  -- Estimated LOS: 3-4 days  Aurelia Blotter, MD 02/20/2023, 2:50 PM

## 2023-02-20 NOTE — Plan of Care (Signed)
  Problem: Education: Goal: Utilization of techniques to improve thought processes will improve Outcome: Progressing Goal: Knowledge of the prescribed therapeutic regimen will improve Outcome: Progressing   Problem: Activity: Goal: Interest or engagement in leisure activities will improve Outcome: Progressing Goal: Imbalance in normal sleep/wake cycle will improve Outcome: Progressing   

## 2023-02-20 NOTE — Plan of Care (Signed)
  Problem: Health Behavior/Discharge Planning: Goal: Ability to make decisions will improve Outcome: Progressing   Problem: Activity: Goal: Interest or engagement in leisure activities will improve Outcome: Not Progressing

## 2023-02-20 NOTE — Group Note (Signed)
 Date:  02/20/2023 Time:  5:35 PM  Group Topic/Focus:  Orientation:   The focus of this group is to educate the patient on the purpose and policies of crisis stabilization and provide a format to answer questions about their admission.  The group details unit policies and expectations of patients while admitted.    Participation Level:  Active  Participation Quality:  Appropriate, Attentive, and Supportive  Affect:  Appropriate  Cognitive:  Alert, Appropriate, and Oriented  Insight: Appropriate and Good  Engagement in Group:  Engaged and Supportive  Modes of Intervention:  Orientation and Socialization  Additional Comments:     Fleet Huh 02/20/2023, 5:35 PM

## 2023-02-20 NOTE — Group Note (Signed)
 Date:  02/20/2023 Time:  9:01 PM  Group Topic/Focus:  Making Healthy Choices:   The focus of this group is to help patients identify negative/unhealthy choices they were using prior to admission and identify positive/healthier coping strategies to replace them upon discharge.    Participation Level:  Did not attend  Participation Quality:   Did not attend  Affect:   Did not attend  Cognitive:   Did not attend  Insight: None  Engagement in Group:  None  Modes of Intervention:  Confrontation  Additional Comments:    Sherlie Distance 02/20/2023, 9:01 PM

## 2023-02-21 MED ORDER — HYDROXYZINE HCL 25 MG PO TABS
25.0000 mg | ORAL_TABLET | Freq: Four times a day (QID) | ORAL | Status: DC | PRN
  Administered 2023-02-21 – 2023-03-06 (×20): 25 mg via ORAL
  Filled 2023-02-21 (×20): qty 1

## 2023-02-21 NOTE — Progress Notes (Signed)
 Patient is pleasant and cooperative.  Sad affect.  Endorses depression, anxiety, passive SI and command AH.  Contracts for safety on the unit.  Reports poor sleep.  Pain rated 8/10 in neck.    Compliant with scheduled medications. PRN medications given x 1 for pain and anxiety.   15 min checks in place for safety.  Patient isolates to room with the exception of meals.  Minimal interaction with peers and staff.  Patient did attend RT group.

## 2023-02-21 NOTE — Progress Notes (Signed)
Accepted responsibility of nursing care last pm. 1900, his affect and mood remain sad and depressed and continues to endorse SI but no plan or intent while in the hospital. Pt continues to isolate to his room, declined group attendance and to obtain snacks, seems in despair. Pt is compliant with taking his medications and denies feeling any upward effect. Pt is managed on q 15 min rounds.    02/20/23 2200  Psych Admission Type (Psych Patients Only)  Admission Status Voluntary  Psychosocial Assessment  Patient Complaints Depression  Eye Contact Brief  Facial Expression Flat;Sad  Affect Flat  Speech Logical/coherent  Interaction Minimal  Motor Activity Slow  Appearance/Hygiene In scrubs  Behavior Characteristics Unwilling to participate  Mood Depressed;Anhedonia  Thought Process  Coherency WDL  Content WDL  Delusions None reported or observed  Perception WDL  Hallucination None reported or observed  Judgment Impaired  Confusion None  Danger to Self  Description of Agreement \

## 2023-02-21 NOTE — Group Note (Signed)
Date:  02/21/2023 Time:  10:33 AM  Group Topic/Focus:  Coping With Mental Health Crisis:   The purpose of this group is to help patients identify strategies for coping with mental health crisis.  Group discusses possible causes of crisis and ways to manage them effectively.    Participation Level:  Did Not Attend   Ardelle Anton 02/21/2023, 10:33 AM

## 2023-02-21 NOTE — Group Note (Signed)
Date:  02/21/2023 Time:  9:00 PM  Group Topic/Focus:  Overcoming Stress:   The focus of this group is to define stress and help patients assess their triggers.    Participation Level:  Active  Participation Quality:  Appropriate  Affect:  Appropriate  Cognitive:  Appropriate  Insight: Appropriate  Engagement in Group:  Engaged  Modes of Intervention:  Confrontation  Additional Comments:    Garry Heater 02/21/2023, 9:00 PM

## 2023-02-21 NOTE — Progress Notes (Signed)
[facility] MD Progress Note  02/21/2023 11:58 AM [name] [name]  MRN:  [id number] [name] is a 63 y.o. male who was initially medically admitted after presenting to [facility] on 02/12/23 for suicidal thoughts, seizures. Was noted to have elevated CK and admitted to hospitalist service for management of rhabdomyolysis. Patient is transferred to geropsych unit after he is medically cleared.   Subjective:  Chart reviewed, case discussed in multidisciplinary meeting, patient seen during rounds.  Patient is noted to be resting in room.  He reports that he feels tired.  He continues to endorse severe depression with hopelessness.  He reports command type auditory hallucinations telling him to kill himself.  He did acknowledge that to reach out to the nursing staff if he feels overwhelmed for safety.  He continues to endorse suicidal ideation no specific plan.  He is not displaying any self-harm behaviors on the unit.  He denies HI/plan he denies visual hallucinations.  He takes medications with no reported side effects.  Sleep: Fair  Appetite:  Fair  Past Psychiatric History: see h&P Family History:  Family History  Problem Relation Age of Onset   CVA Mother        deceased at age 60   Depression Brother        Died by suicide at age 39   Social History:  Social History   Substance and Sexual Activity  Alcohol Use Not Currently   Comment: rare     Social History   Substance and Sexual Activity  Drug Use Yes   Types: Marijuana, Cocaine   Comment: last use January 2025, cocaine    Social History   Socioeconomic History   Marital status: Single    Spouse name: Not on file   Number of children: Not on file   Years of education: Not on file   Highest education level: Not on file  Occupational History   Not on file  Tobacco Use   Smoking status: Former    Current packs/day: 0.00    Average packs/day: 0.8 packs/day for 20.0 years (15.0 ttl pk-yrs)    Types: Cigarettes    Start  date: 05/16/1964    Quit date: 05/16/1984    Years since quitting: 38.7   Smokeless tobacco: Never  Vaping Use   Vaping status: Never Used  Substance and Sexual Activity   Alcohol use: Not Currently    Comment: rare   Drug use: Yes    Types: Marijuana, Cocaine    Comment: last use January 2025, cocaine   Sexual activity: Not Currently  Other Topics Concern   Not on file  Social History Narrative   ** Merged History Encounter **       Social Drivers of Health   Financial Resource Strain: Not on file  Food Insecurity: No Food Insecurity (02/15/2023)   Hunger Vital Sign    Worried About Running Out of Food in the Last Year: Never true    Ran Out of Food in the Last Year: Never true  Recent Concern: Food Insecurity - Food Insecurity Present (12/20/2022)   Hunger Vital Sign    Worried About Running Out of Food in the Last Year: Sometimes true    Ran Out of Food in the Last Year: Sometimes true  Transportation Needs: No Transportation Needs (02/15/2023)   PRAPARE - Administrator, Civil Service (Medical): No    Lack of Transportation (Non-Medical): No  Recent Concern: Transportation Needs - Unmet Transportation Needs (02/14/2023)  PRAPARE - Administrator, Civil Service (Medical): Yes    Lack of Transportation (Non-Medical): Yes  Physical Activity: Not on file  Stress: Not on file  Social Connections: Socially Isolated (02/15/2023)   Social Connection and Isolation Panel [NHANES]    Frequency of Communication with Friends and Family: Once a week    Frequency of Social Gatherings with Friends and Family: Once a week    Attends Religious Services: Never    Database administrator or Organizations: No    Attends Engineer, structural: Never    Marital Status: Never married   Past Medical History:  Past Medical History:  Diagnosis Date   Anxiety    Arthritis    knees and hands   Bipolar 1 disorder, depressed (HCC)    Bipolar disorder (HCC)     Depression    GERD (gastroesophageal reflux disease)    Hepatitis    HEP "C"   History of kidney stones    Hypertension    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 echo 5/17: EF 60-65%, no RWMA, LV dias fxn nl, LA mildly dilated, RV sys fxn nl, PASP nl, moderate sized circumferential pericardial effusion was identified, 2.12 cm around the LV free wall, <1 cm around the RV free wall. Features were not c/w tamponade physiology   PTSD (post-traumatic stress disorder)    Witnessed brother's suicide.   Restless leg syndrome    Seizures (HCC)    Syncope     Past Surgical History:  Procedure Laterality Date   APPENDECTOMY     CYSTOSCOPY WITH URETEROSCOPY AND STENT PLACEMENT     ESOPHAGOGASTRODUODENOSCOPY N/A 01/11/2016   Procedure: ESOPHAGOGASTRODUODENOSCOPY (EGD);  Surgeon: Charlott Rakes, MD;  Location: [facility] ENDOSCOPY;  Service: Endoscopy;  Laterality: N/A;   ESOPHAGOGASTRODUODENOSCOPY N/A 04/09/2020   Procedure: ESOPHAGOGASTRODUODENOSCOPY (EGD);  Surgeon: [name], MD;  Location: [facility] ENDOSCOPY;  Service: Gastroenterology;  Laterality: N/A;   INCISION AND DRAINAGE ABSCESS Left 01/02/2018   Procedure: INCISION AND DRAINAGE LEFT KNEE;  Surgeon: Deeann Saint, MD;  Location: [facility] ORS;  Service: Orthopedics;  Laterality: Left;   JOINT REPLACEMENT Right    TKR   KNEE ARTHROSCOPY Right 06/25/2014   Procedure: ARTHROSCOPY KNEE;  Surgeon: Deeann Saint, MD;  Location: [facility] ORS;  Service: Orthopedics;  Laterality: Right;  partial arthroscopic medial menisectomy   LAPAROSCOPIC APPENDECTOMY N/A 06/02/2021   Procedure: APPENDECTOMY LAPAROSCOPIC;  Surgeon: Campbell Lerner, MD;  Location: [facility] ORS;  Service: General;  Laterality: N/A;   TOTAL KNEE ARTHROPLASTY Right 04/22/2015   Procedure: TOTAL KNEE ARTHROPLASTY;  Surgeon: Deeann Saint, MD;  Location: [facility] ORS;  Service: Orthopedics;  Laterality: Right;   TOTAL KNEE ARTHROPLASTY  Left 10/30/2017   Procedure: TOTAL KNEE ARTHROPLASTY;  Surgeon: Deeann Saint, MD;  Location: [facility] ORS;  Service: Orthopedics;  Laterality: Left;   TOTAL KNEE REVISION Left 01/02/2018   Procedure: poly exchange of tibia and patella left knee;  Surgeon: Deeann Saint, MD;  Location: [facility] ORS;  Service: Orthopedics;  Laterality: Left;   UMBILICAL HERNIA REPAIR  06/02/2021   Procedure: HERNIA REPAIR UMBILICAL ADULT;  Surgeon: Campbell Lerner, MD;  Location: [facility] ORS;  Service: General;;    Current Medications: Current Facility-Administered Medications  Medication Dose Route Frequency Provider Last Rate Last Admin   acetaminophen (TYLENOL) tablet 650 mg  650 mg Oral Q6H PRN Lauree Chandler, NP   650 mg at 02/17/23 2127  Or   acetaminophen (TYLENOL) suppository 650 mg  650 mg Rectal Q6H PRN Lauree Chandler, NP       divalproex (DEPAKOTE) DR tablet 500 mg  500 mg Oral BID Lewanda Rife, MD   500 mg at 02/21/23 1048   feeding supplement (ENSURE ENLIVE / ENSURE PLUS) liquid 237 mL  237 mL Oral TID BM Lewanda Rife, MD   237 mL at 02/20/23 1341   hydrOXYzine (ATARAX) tablet 50 mg  50 mg Oral QHS Lewanda Rife, MD   50 mg at 02/20/23 2122   levOCARNitine (CARNITOR) 1 GM/10ML solution 500 mg  500 mg Oral Daily Lauree Chandler, NP   500 mg at 02/21/23 1048   lurasidone (LATUDA) tablet 80 mg  80 mg Oral Q supper Lewanda Rife, MD   80 mg at 02/20/23 1734   melatonin tablet 5 mg  5 mg Oral QHS Lewanda Rife, MD   5 mg at 02/20/23 2121   metoprolol succinate (TOPROL-XL) 24 hr tablet 50 mg  50 mg Oral Daily Lewanda Rife, MD   50 mg at 02/21/23 1048   multivitamin with minerals tablet 1 tablet  1 tablet Oral Daily Lewanda Rife, MD   1 tablet at 02/21/23 1047   OLANZapine (ZYPREXA) injection 5 mg  5 mg Intramuscular TID PRN Lauree Chandler, NP       OLANZapine zydis (ZYPREXA) disintegrating tablet 5 mg  5 mg Oral TID PRN Lauree Chandler, NP       tamsulosin  [facility]) capsule 0.4 mg  0.4 mg Oral QPC supper Lewanda Rife, MD   0.4 mg at 02/20/23 1735   temazepam (RESTORIL) capsule 15 mg  15 mg Oral QHS Lewanda Rife, MD   15 mg at 02/20/23 2122   traMADol (ULTRAM) tablet 50 mg  50 mg Oral Q6H PRN Lewanda Rife, MD   50 mg at 02/17/23 0919   venlafaxine XR (EFFEXOR-XR) 24 hr capsule 75 mg  75 mg Oral QPC breakfast Lewanda Rife, MD   75 mg at 02/21/23 1048    Lab Results:  No results found for this or any previous visit (from the past 48 hours).   Blood Alcohol level:  Lab Results  Component Value Date   ETH <10 02/12/2023   ETH <10 12/18/2022    Metabolic Disorder Labs: Lab Results  Component Value Date   HGBA1C 5.5 12/29/2022   MPG 111.15 12/29/2022   MPG 111 04/16/2022   No results found for: "PROLACTIN" Lab Results  Component Value Date   CHOL 208 (H) 12/29/2022   TRIG 145 12/29/2022   HDL 33 (L) 12/29/2022   CHOLHDL 6.3 12/29/2022   VLDL 29 12/29/2022   LDLCALC 146 (H) 12/29/2022   LDLCALC 125 (H) 04/16/2022    Physical Findings: AIMS:  , ,  ,  ,    CIWA:    COWS:      Psychiatric Specialty Exam:  Presentation  General Appearance:  Appropriate for Environment; Casual  Eye Contact: Fair  Speech: Clear and Coherent  Speech Volume: Decreased    Mood and Affect  Mood: Hopeless; Depressed  Affect: Depressed; Blunt   Thought Process  Thought Processes: Coherent  Descriptions of Associations:Intact  Orientation:Full (Time, Place and Person)  Thought Content:Rumination  Hallucinations:Hallucinations: Auditory; Command Description of Command Hallucinations: telling him to kill self  Ideas of Reference:None  Suicidal Thoughts:Suicidal Thoughts: Yes, Passive SI Active Intent and/or Plan: Without Intent; Without Plan SI Passive Intent and/or Plan: Without Intent; Without Plan  Homicidal Thoughts:Homicidal Thoughts: No   Sensorium  Memory: Immediate Fair; Recent Fair; Remote  Fair  Judgment: Impaired  Insight: Shallow   Executive Functions  Concentration: Fair  Attention Span: Fair  Recall: [employer] of Knowledge: Fair  Language: Fair   Psychomotor Activity  Psychomotor Activity: Psychomotor Activity: Normal  Musculoskeletal: Strength & Muscle Tone: within normal limits Gait & Station: normal Assets  Assets: Manufacturing systems engineer; Desire for Improvement; Physical Health; Resilience    Physical Exam: Physical Exam Vitals and nursing note reviewed.  HENT:     Head: Normocephalic.     Nose: Nose normal.     Mouth/Throat:     Mouth: Mucous membranes are moist.  Eyes:     Pupils: Pupils are equal, round, and reactive to light.  Cardiovascular:     Rate and Rhythm: Normal rate.  Pulmonary:     Breath sounds: Normal breath sounds.  Abdominal:     General: Bowel sounds are normal.  Skin:    General: Skin is warm.  Neurological:     General: No focal deficit present.     Mental Status: He is alert.    Review of Systems  Constitutional: Negative.   HENT: Negative.    Eyes: Negative.   Respiratory: Negative.    Cardiovascular: Negative.   Gastrointestinal: Negative.   Skin: Negative.   Neurological: Negative.    Blood pressure 125/76, pulse 86, temperature 97.6 F (36.4 C), resp. rate 15, SpO2 95%. There is no height or weight on file to calculate BMI.  Diagnosis: Principal Problem:   Bipolar disorder, current episode depressed, severe, with psychotic features (HCC) Active Problems:   Cocaine use   Rhabdomyolysis   Seizures (HCC)   PLAN: Safety and Monitoring:  -- Voluntary admission to inpatient psychiatric unit for safety, stabilization and treatment  -- Daily contact with patient to assess and evaluate symptoms and progress in treatment  -- Patient's case to be discussed in multi-disciplinary team meeting  -- Observation Level : q15 minute checks  -- Vital signs:  q12 hours  -- Precautions: suicide,  elopement, and assault -- Encouraged patient to participate in unit milieu and in scheduled group therapies  2. Psychiatric Diagnoses and Treatment:    Depakote 500 mg by mouth twice daily for mood stabilization and seizure d/o- will check Depakote levels -Continue Latuda to 80 mg daily.  Dose increased 02/18/2023 -Melatonin 5 mg at bedtime for insomnia -Effexor 75 mg to help with depression  - Vistaril 50 mg at bedtime for Insomnia     3. Medical Issues Being Addressed:  Metoprolol for SVT and HTN   Ck down to 226, WNL. Patient encouraged to drink water and keep himself hydrated  4. Discharge Planning:   -- Social work and case management to assist with discharge planning and identification of hospital follow-up needs prior to discharge  -- Estimated LOS: 3-4 days  [name], MD 02/21/2023, 11:58 AM

## 2023-02-21 NOTE — Group Note (Signed)
Recreation Therapy Group Note   Group Topic:Coping Skills  Group Date: 02/21/2023 Start Time: 1400 End Time: 1450 Facilitators: Rosina Lowenstein, LRT, CTRS Location:  Dayroom  Group Description: Coping A-Z. LRT and patients engage in a guided discussion on what coping skills are and gave specific examples. LRT passed out a handout labeled Coping A-Z with blank spaces beside each letter. LRT prompted patients to come up with a coping skill for each of the letters. LRT and patients went over the handout and gave ideas for each letter if anyone had any blanks left on their paper. Patients kept this handout with them that listed 26 different coping skills.   Goal Area(s) Addressed: Patients will be able to define "coping skills". Patient will identify new coping skills.  Patient will increase communication.   Affect/Mood: Appropriate   Participation Level: Active and Engaged   Participation Quality: Independent   Behavior: Calm, Cooperative, and Reserved   Speech/Thought Process: Coherent   Insight: Fair   Judgement: Good   Modes of Intervention: Clarification, Education, Exploration, Guided Discussion, Open Conversation, and Worksheet   Patient Response to Interventions:  Receptive   Education Outcome:  Acknowledges education   Clinical Observations/Individualized Feedback: [name] was active in their participation of session activities and group discussion. Pt identified "aromatherapy, walking, sitting in nature" as coping skills. Pt minimally interacted with LRT and peers while present in group.    Plan: Continue to engage patient in RT group sessions 2-3x/week.   Rosina Lowenstein, LRT, CTRS 02/21/2023 5:15 PM

## 2023-02-21 NOTE — Plan of Care (Signed)
  Problem: Coping: Goal: Coping ability will improve Outcome: Progressing   Problem: Activity: Goal: Interest or engagement in leisure activities will improve Outcome: Not Progressing   Problem: Health Behavior/Discharge Planning: Goal: Compliance with therapeutic regimen will improve Outcome: Not Progressing

## 2023-02-22 MED ORDER — HYDROXYZINE HCL 50 MG PO TABS
100.0000 mg | ORAL_TABLET | Freq: Every day | ORAL | Status: DC
  Administered 2023-02-22: 100 mg via ORAL
  Filled 2023-02-22: qty 2

## 2023-02-22 NOTE — BH IP Treatment Plan (Signed)
Interdisciplinary Treatment and Diagnostic Plan Update  02/22/2023 Time of Session: 9:30 AM  [name] MRN: [id number]  Principal Diagnosis: Bipolar disorder, current episode depressed, severe, with psychotic features (HCC)  Secondary Diagnoses: Principal Problem:   Bipolar disorder, current episode depressed, severe, with psychotic features (HCC) Active Problems:   Cocaine use   Rhabdomyolysis   Seizures (HCC)   Current Medications:  Current Facility-Administered Medications  Medication Dose Route Frequency Provider Last Rate Last Admin   acetaminophen (TYLENOL) tablet 650 mg  650 mg Oral Q6H PRN Lauree Chandler, NP   650 mg at 02/17/23 2127   Or   acetaminophen (TYLENOL) suppository 650 mg  650 mg Rectal Q6H PRN Lauree Chandler, NP       divalproex (DEPAKOTE) DR tablet 500 mg  500 mg Oral BID Lewanda Rife, MD   500 mg at 02/22/23 8657   feeding supplement (ENSURE ENLIVE / ENSURE PLUS) liquid 237 mL  237 mL Oral TID BM Lewanda Rife, MD   237 mL at 02/22/23 0954   hydrOXYzine (ATARAX) tablet 100 mg  100 mg Oral QHS [name], MD       hydrOXYzine (ATARAX) tablet 25 mg  25 mg Oral Q6H PRN [name], MD   25 mg at 02/22/23 1048   levOCARNitine (CARNITOR) 1 GM/10ML solution 500 mg  500 mg Oral Daily Lauree Chandler, NP   500 mg at 02/21/23 1048   lurasidone (LATUDA) tablet 80 mg  80 mg Oral Q supper Lewanda Rife, MD   80 mg at 02/21/23 1707   melatonin tablet 5 mg  5 mg Oral QHS Lewanda Rife, MD   5 mg at 02/21/23 2126   metoprolol succinate (TOPROL-XL) 24 hr tablet 50 mg  50 mg Oral Daily Lewanda Rife, MD   50 mg at 02/22/23 8469   multivitamin with minerals tablet 1 tablet  1 tablet Oral Daily Lewanda Rife, MD   1 tablet at 02/22/23 0952   OLANZapine (ZYPREXA) injection 5 mg  5 mg Intramuscular TID PRN Lauree Chandler, NP       OLANZapine zydis (ZYPREXA) disintegrating tablet 5 mg  5 mg Oral TID PRN Lauree Chandler,  NP       tamsulosin [facility]) capsule 0.4 mg  0.4 mg Oral QPC supper Lewanda Rife, MD   0.4 mg at 02/21/23 1707   temazepam (RESTORIL) capsule 15 mg  15 mg Oral QHS Lewanda Rife, MD   15 mg at 02/21/23 2126   traMADol (ULTRAM) tablet 50 mg  50 mg Oral Q6H PRN Lewanda Rife, MD   50 mg at 02/22/23 1048   venlafaxine XR (EFFEXOR-XR) 24 hr capsule 75 mg  75 mg Oral QPC breakfast Lewanda Rife, MD   75 mg at 02/22/23 6295   PTA Medications: Medications Prior to Admission  Medication Sig Dispense Refill Last Dose/Taking   divalproex (DEPAKOTE) 500 MG DR tablet Take 1 tablet (500 mg total) by mouth 2 (two) times daily. 60 tablet 0    levOCARNitine (CARNITOR) 1 GM/10ML solution Take 5 mLs (500 mg total) by mouth daily.      lurasidone (LATUDA) 40 MG TABS tablet Take 1 tablet (40 mg total) by mouth daily with supper. 30 tablet 0    metoprolol succinate (TOPROL-XL) 50 MG 24 hr tablet Take 1 tablet (50 mg total) by mouth daily. 30 tablet 0    tamsulosin (FLOMAX) 0.4 MG CAPS capsule Take 1 capsule (0.4 mg total) by mouth daily after supper.  temazepam (RESTORIL) 15 MG capsule Take 1 capsule (15 mg total) by mouth at bedtime. 30 capsule 0    traMADol (ULTRAM) 50 MG tablet Take 50 mg by mouth every 6 (six) hours as needed.      venlafaxine XR (EFFEXOR-XR) 75 MG 24 hr capsule Take 1 capsule (75 mg total) by mouth daily after breakfast. 30 capsule 0     Patient Stressors:    Patient Strengths:    Treatment Modalities: Medication Management, Group therapy, Case management,  1 to 1 session with clinician, Psychoeducation, Recreational therapy.   Physician Treatment Plan for Primary Diagnosis: Bipolar disorder, current episode depressed, severe, with psychotic features (HCC) Long Term Goal(s): Improvement in symptoms so as ready for discharge   Short Term Goals: Ability to identify changes in lifestyle to reduce recurrence of condition will improve Ability to verbalize feelings will  improve Ability to disclose and discuss suicidal ideas Ability to demonstrate self-control will improve Ability to identify and develop effective coping behaviors will improve Ability to maintain clinical measurements within normal limits will improve Compliance with prescribed medications will improve Ability to identify triggers associated with substance abuse/mental health issues will improve  Medication Management: Evaluate patient's response, side effects, and tolerance of medication regimen.  Therapeutic Interventions: 1 to 1 sessions, Unit Group sessions and Medication administration.  Evaluation of Outcomes: Progressing  Physician Treatment Plan for Secondary Diagnosis: Principal Problem:   Bipolar disorder, current episode depressed, severe, with psychotic features (HCC) Active Problems:   Cocaine use   Rhabdomyolysis   Seizures (HCC)  Long Term Goal(s): Improvement in symptoms so as ready for discharge   Short Term Goals: Ability to identify changes in lifestyle to reduce recurrence of condition will improve Ability to verbalize feelings will improve Ability to disclose and discuss suicidal ideas Ability to demonstrate self-control will improve Ability to identify and develop effective coping behaviors will improve Ability to maintain clinical measurements within normal limits will improve Compliance with prescribed medications will improve Ability to identify triggers associated with substance abuse/mental health issues will improve     Medication Management: Evaluate patient's response, side effects, and tolerance of medication regimen.  Therapeutic Interventions: 1 to 1 sessions, Unit Group sessions and Medication administration.  Evaluation of Outcomes: Progressing   RN Treatment Plan for Primary Diagnosis: Bipolar disorder, current episode depressed, severe, with psychotic features (HCC) Long Term Goal(s): Knowledge of disease and therapeutic regimen to maintain  health will improve  Short Term Goals: Ability to remain free from injury will improve, Ability to verbalize frustration and anger appropriately will improve, Ability to demonstrate self-control, Ability to participate in decision making will improve, Ability to verbalize feelings will improve, Ability to disclose and discuss suicidal ideas, Ability to identify and develop effective coping behaviors will improve, and Compliance with prescribed medications will improve  Medication Management: RN will administer medications as ordered by provider, will assess and evaluate patient's response and provide education to patient for prescribed medication. RN will report any adverse and/or side effects to prescribing provider.  Therapeutic Interventions: 1 on 1 counseling sessions, Psychoeducation, Medication administration, Evaluate responses to treatment, Monitor vital signs and CBGs as ordered, Perform/monitor CIWA, COWS, AIMS and Fall Risk screenings as ordered, Perform wound care treatments as ordered.  Evaluation of Outcomes: Progressing   LCSW Treatment Plan for Primary Diagnosis: Bipolar disorder, current episode depressed, severe, with psychotic features (HCC) Long Term Goal(s): Safe transition to appropriate next level of care at discharge, Engage patient in therapeutic group addressing interpersonal  concerns.  Short Term Goals: Engage patient in aftercare planning with referrals and resources, Increase social support, Increase ability to appropriately verbalize feelings, Increase emotional regulation, Facilitate acceptance of mental health diagnosis and concerns, Facilitate patient progression through stages of change regarding substance use diagnoses and concerns, Identify triggers associated with mental health/substance abuse issues, and Increase skills for wellness and recovery  Therapeutic Interventions: Assess for all discharge needs, 1 to 1 time with Social worker, Explore available resources  and support systems, Assess for adequacy in community support network, Educate family and significant other(s) on suicide prevention, Complete Psychosocial Assessment, Interpersonal group therapy.  Evaluation of Outcomes: Progressing   Progress in Treatment: Attending groups: No. Participating in groups: No. Taking medication as prescribed: Yes. Toleration medication: Yes. Family/Significant other contact made: No, will contact:  CSW  will contact if given permission Patient understands diagnosis: Yes. Discussing patient identified problems/goals with staff: Yes. Medical problems stabilized or resolved: Yes. Denies suicidal/homicidal ideation: Yes. Issues/concerns per patient self-inventory: No. Other: None    New problem(s) identified: No, Describe:  None identified  Update 02/22/23: No changes at this time    New Short Term/Long Term Goal(s):  elimination of symptoms of psychosis, medication management for mood stabilization; elimination of SI thoughts; development of comprehensive mental wellness plan. Update 02/22/23: No changes at this time      Patient Goals:  "I want to feel better, I want to stop having these flashbacks, get this anxiety under control" Update 02/22/23: No changes at this time      Discharge Plan or Barriers: CSW will assist with appropriate discharge planning Update 02/22/23: No changes at this time      Reason for Continuation of Hospitalization: Anxiety Depression Medication stabilization Withdrawal symptoms   Estimated Length of Stay: 1 to 7 days Update 02/22/23: TBD    Last 3 [place] Suicide Severity Risk Score: Flowsheet Row Admission (Current) from 02/15/2023 in [facility] BEHAVIOR[facility] ED to Hosp-Admission (Discharged) from 02/12/2023 in [facility] 1C MEDICAL TELEMETRY Admission (Discharged) from 12/23/2022 in [facility] INPATIENT BEHAVIORAL MEDICINE  C-SSRS RISK CATEGORY High Risk High Risk High Risk       Last PHQ 2/9  Scores:     No data to display          Scribe for Treatment Team: [name], Theresia Majors 02/22/2023 4:23 PM

## 2023-02-22 NOTE — Progress Notes (Signed)
Patient alert and oriented. Calm and cooperative. Flat Affect. Reports SI to hang himself. Contracts for safety on the unit. Isolates in room except for meds/meals. No behavior issues noted. Q 15 min checks maintained for safety. Denies SI, HI, AVH, and pain.

## 2023-02-22 NOTE — Plan of Care (Signed)
  Problem: Education: Goal: Utilization of techniques to improve thought processes will improve Outcome: Progressing Goal: Knowledge of the prescribed therapeutic regimen will improve Outcome: Progressing   Problem: Activity: Goal: Interest or engagement in leisure activities will improve Outcome: Progressing Goal: Imbalance in normal sleep/wake cycle will improve Outcome: Progressing   Problem: Coping: Goal: Coping ability will improve Outcome: Progressing Goal: Will verbalize feelings Outcome: Progressing   Problem: Safety: Goal: Ability to disclose and discuss suicidal ideas will improve Outcome: Progressing Goal: Ability to identify and utilize support systems that promote safety will improve Outcome: Progressing

## 2023-02-22 NOTE — Progress Notes (Signed)
[facility] MD Progress Note  02/22/2023 11:55 AM [name]  MRN:  [id number] [name] is a 63 y.o. male who was initially medically admitted after presenting to [facility] on 02/12/23 for suicidal thoughts, seizures. Was noted to have elevated CK and admitted to hospitalist service for management of rhabdomyolysis. Patient is transferred to geropsych unit after he is medically cleared.   Subjective:  Chart reviewed, case discussed in multidisciplinary meeting, patient seen during rounds.  Patient is more visible on the unit, sat in the dayroom for some time participated in groups.  He continues to report feeling hopeless and depressed.  He continues to endorse passive suicidal ideation with no active intent or plan.  He denies Visual hallucinations but reports ongoing command type auditory hallucinations.  He is taking his medications with no problems.  He reports having problems at night with sleep and is requesting to adjust his medication.  He reports that with trazodone he had priapism and with melatonin he became very restless.  Discussed the plan to increase the hydroxyzine to 100 mg nightly in addition to the temazepam 50 mg nightly.  Sleep: Fair  Appetite:  Fair  Past Psychiatric History: see h&P Family History:  Family History  Problem Relation Age of Onset   CVA Mother        deceased at age 54   Depression Brother        Died by suicide at age 33   Social History:  Social History   Substance and Sexual Activity  Alcohol Use Not Currently   Comment: rare     Social History   Substance and Sexual Activity  Drug Use Yes   Types: Marijuana, Cocaine   Comment: last use January 2025, cocaine    Social History   Socioeconomic History   Marital status: Single    Spouse name: Not on file   Number of children: Not on file   Years of education: Not on file   Highest education level: Not on file  Occupational History   Not on file  Tobacco Use   Smoking status: Former     Current packs/day: 0.00    Average packs/day: 0.8 packs/day for 20.0 years (15.0 ttl pk-yrs)    Types: Cigarettes    Start date: 05/16/1964    Quit date: 05/16/1984    Years since quitting: 38.7   Smokeless tobacco: Never  Vaping Use   Vaping status: Never Used  Substance and Sexual Activity   Alcohol use: Not Currently    Comment: rare   Drug use: Yes    Types: Marijuana, Cocaine    Comment: last use January 2025, cocaine   Sexual activity: Not Currently  Other Topics Concern   Not on file  Social History Narrative   ** Merged History Encounter **       Social Drivers of Health   Financial Resource Strain: Not on file  Food Insecurity: No Food Insecurity (02/15/2023)   Hunger Vital Sign    Worried About Running Out of Food in the Last Year: Never true    Ran Out of Food in the Last Year: Never true  Recent Concern: Food Insecurity - Food Insecurity Present (12/20/2022)   Hunger Vital Sign    Worried About Running Out of Food in the Last Year: Sometimes true    Ran Out of Food in the Last Year: Sometimes true  Transportation Needs: No Transportation Needs (02/15/2023)   PRAPARE - Transportation    Lack of  Transportation (Medical): No    Lack of Transportation (Non-Medical): No  Recent Concern: Transportation Needs - Unmet Transportation Needs (02/14/2023)   PRAPARE - Administrator, Civil Service (Medical): Yes    Lack of Transportation (Non-Medical): Yes  Physical Activity: Not on file  Stress: Not on file  Social Connections: Socially Isolated (02/15/2023)   Social Connection and Isolation Panel [NHANES]    Frequency of Communication with Friends and Family: Once a week    Frequency of Social Gatherings with Friends and Family: Once a week    Attends Religious Services: Never    Database administrator or Organizations: No    Attends Engineer, structural: Never    Marital Status: Never married   Past Medical History:  Past Medical History:  Diagnosis  Date   Anxiety    Arthritis    knees and hands   Bipolar 1 disorder, depressed (HCC)    Bipolar disorder (HCC)    Depression    GERD (gastroesophageal reflux disease)    Hepatitis    HEP "C"   History of kidney stones    Hypertension    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 echo 5/17: EF 60-65%, no RWMA, LV dias fxn nl, LA mildly dilated, RV sys fxn nl, PASP nl, moderate sized circumferential pericardial effusion was identified, 2.12 cm around the LV free wall, <1 cm around the RV free wall. Features were not c/w tamponade physiology   PTSD (post-traumatic stress disorder)    Witnessed brother's suicide.   Restless leg syndrome    Seizures (HCC)    Syncope     Past Surgical History:  Procedure Laterality Date   APPENDECTOMY     CYSTOSCOPY WITH URETEROSCOPY AND STENT PLACEMENT     ESOPHAGOGASTRODUODENOSCOPY N/A 01/11/2016   Procedure: ESOPHAGOGASTRODUODENOSCOPY (EGD);  Surgeon: Charlott Rakes, MD;  Location: [facility] ENDOSCOPY;  Service: Endoscopy;  Laterality: N/A;   ESOPHAGOGASTRODUODENOSCOPY N/A 04/09/2020   Procedure: ESOPHAGOGASTRODUODENOSCOPY (EGD);  Surgeon: [name], MD;  Location: [facility] ENDOSCOPY;  Service: Gastroenterology;  Laterality: N/A;   INCISION AND DRAINAGE ABSCESS Left 01/02/2018   Procedure: INCISION AND DRAINAGE LEFT KNEE;  Surgeon: Deeann Saint, MD;  Location: [facility] ORS;  Service: Orthopedics;  Laterality: Left;   JOINT REPLACEMENT Right    TKR   KNEE ARTHROSCOPY Right 06/25/2014   Procedure: ARTHROSCOPY KNEE;  Surgeon: Deeann Saint, MD;  Location: [facility] ORS;  Service: Orthopedics;  Laterality: Right;  partial arthroscopic medial menisectomy   LAPAROSCOPIC APPENDECTOMY N/A 06/02/2021   Procedure: APPENDECTOMY LAPAROSCOPIC;  Surgeon: Campbell Lerner, MD;  Location: [facility] ORS;  Service: General;  Laterality: N/A;   TOTAL KNEE ARTHROPLASTY Right 04/22/2015   Procedure: TOTAL KNEE ARTHROPLASTY;   Surgeon: Deeann Saint, MD;  Location: [facility] ORS;  Service: Orthopedics;  Laterality: Right;   TOTAL KNEE ARTHROPLASTY Left 10/30/2017   Procedure: TOTAL KNEE ARTHROPLASTY;  Surgeon: Deeann Saint, MD;  Location: [facility] ORS;  Service: Orthopedics;  Laterality: Left;   TOTAL KNEE REVISION Left 01/02/2018   Procedure: poly exchange of tibia and patella left knee;  Surgeon: Deeann Saint, MD;  Location: [facility] ORS;  Service: Orthopedics;  Laterality: Left;   UMBILICAL HERNIA REPAIR  06/02/2021   Procedure: HERNIA REPAIR UMBILICAL ADULT;  Surgeon: Campbell Lerner, MD;  Location: [facility] ORS;  Service: General;;    Current Medications: Current Facility-Administered Medications  Medication Dose Route Frequency Provider Last Rate Last Admin  acetaminophen (TYLENOL) tablet 650 mg  650 mg Oral Q6H PRN Lauree Chandler, NP   650 mg at 02/17/23 2127   Or   acetaminophen (TYLENOL) suppository 650 mg  650 mg Rectal Q6H PRN Lauree Chandler, NP       divalproex (DEPAKOTE) DR tablet 500 mg  500 mg Oral BID Lewanda Rife, MD   500 mg at 02/22/23 0865   feeding supplement (ENSURE ENLIVE / ENSURE PLUS) liquid 237 mL  237 mL Oral TID BM Lewanda Rife, MD   237 mL at 02/22/23 0954   hydrOXYzine (ATARAX) tablet 25 mg  25 mg Oral Q6H PRN [name], MD   25 mg at 02/22/23 1048   hydrOXYzine (ATARAX) tablet 50 mg  50 mg Oral QHS Lewanda Rife, MD   50 mg at 02/21/23 2126   levOCARNitine (CARNITOR) 1 GM/10ML solution 500 mg  500 mg Oral Daily Lauree Chandler, NP   500 mg at 02/21/23 1048   lurasidone (LATUDA) tablet 80 mg  80 mg Oral Q supper Lewanda Rife, MD   80 mg at 02/21/23 1707   melatonin tablet 5 mg  5 mg Oral QHS Lewanda Rife, MD   5 mg at 02/21/23 2126   metoprolol succinate (TOPROL-XL) 24 hr tablet 50 mg  50 mg Oral Daily Lewanda Rife, MD   50 mg at 02/22/23 7846   multivitamin with minerals tablet 1 tablet  1 tablet Oral Daily Lewanda Rife, MD   1 tablet at  02/22/23 0952   OLANZapine (ZYPREXA) injection 5 mg  5 mg Intramuscular TID PRN Lauree Chandler, NP       OLANZapine zydis (ZYPREXA) disintegrating tablet 5 mg  5 mg Oral TID PRN Lauree Chandler, NP       tamsulosin [facility]) capsule 0.4 mg  0.4 mg Oral QPC supper Lewanda Rife, MD   0.4 mg at 02/21/23 1707   temazepam (RESTORIL) capsule 15 mg  15 mg Oral QHS Lewanda Rife, MD   15 mg at 02/21/23 2126   traMADol (ULTRAM) tablet 50 mg  50 mg Oral Q6H PRN Lewanda Rife, MD   50 mg at 02/22/23 1048   venlafaxine XR (EFFEXOR-XR) 24 hr capsule 75 mg  75 mg Oral QPC breakfast Lewanda Rife, MD   75 mg at 02/22/23 9629    Lab Results:  No results found for this or any previous visit (from the past 48 hours).   Blood Alcohol level:  Lab Results  Component Value Date   ETH <10 02/12/2023   ETH <10 12/18/2022    Metabolic Disorder Labs: Lab Results  Component Value Date   HGBA1C 5.5 12/29/2022   MPG 111.15 12/29/2022   MPG 111 04/16/2022   No results found for: "PROLACTIN" Lab Results  Component Value Date   CHOL 208 (H) 12/29/2022   TRIG 145 12/29/2022   HDL 33 (L) 12/29/2022   CHOLHDL 6.3 12/29/2022   VLDL 29 12/29/2022   LDLCALC 146 (H) 12/29/2022   LDLCALC 125 (H) 04/16/2022    Physical Findings: AIMS:  , ,  ,  ,    CIWA:    COWS:      Psychiatric Specialty Exam:  Presentation  General Appearance:  Appropriate for Environment; Casual  Eye Contact: Fair  Speech: Clear and Coherent  Speech Volume: Normal    Mood and Affect  Mood: Anxious; Depressed  Affect: Depressed; Flat   Thought Process  Thought Processes: Coherent  Descriptions of Associations:Intact  Orientation:Full (Time,  Place and Person)  Thought Content:Logical  Hallucinations:Hallucinations: Auditory Description of Command Hallucinations: telling him to kill self  Ideas of Reference:None  Suicidal Thoughts:Suicidal Thoughts: Yes, Passive SI Active Intent  and/or Plan: Without Intent; Without Plan SI Passive Intent and/or Plan: Without Intent; Without Plan  Homicidal Thoughts:Homicidal Thoughts: No   Sensorium  Memory: Immediate Fair; Recent Fair; Remote Fair  Judgment: Impaired  Insight: Shallow   Executive Functions  Concentration: Fair  Attention Span: Fair  Recall: Fair  Fund of Knowledge: Fair  Language: Fair   Psychomotor Activity  Psychomotor Activity: Psychomotor Activity: Normal  Musculoskeletal: Strength & Muscle Tone: within normal limits Gait & Station: normal Assets  Assets: Manufacturing systems engineer; Desire for Improvement; Physical Health    Physical Exam: Physical Exam Vitals and nursing note reviewed.  HENT:     Head: Normocephalic.     Nose: Nose normal.     Mouth/Throat:     Mouth: Mucous membranes are moist.  Eyes:     Pupils: Pupils are equal, round, and reactive to light.  Cardiovascular:     Rate and Rhythm: Normal rate.  Pulmonary:     Breath sounds: Normal breath sounds.  Abdominal:     General: Bowel sounds are normal.  Skin:    General: Skin is warm.  Neurological:     General: No focal deficit present.     Mental Status: He is alert.    Review of Systems  Constitutional: Negative.   HENT: Negative.    Eyes: Negative.   Respiratory: Negative.    Cardiovascular: Negative.   Gastrointestinal: Negative.   Skin: Negative.   Neurological: Negative.    Blood pressure 114/85, pulse 78, temperature 98.1 F (36.7 C), resp. rate 18, SpO2 99%. There is no height or weight on file to calculate BMI.  Diagnosis: Principal Problem:   Bipolar disorder, current episode depressed, severe, with psychotic features (HCC) Active Problems:   Cocaine use   Rhabdomyolysis   Seizures (HCC)   PLAN: Safety and Monitoring:  -- Voluntary admission to inpatient psychiatric unit for safety, stabilization and treatment  -- Daily contact with patient to assess and evaluate symptoms and  progress in treatment  -- Patient's case to be discussed in multi-disciplinary team meeting  -- Observation Level : q15 minute checks  -- Vital signs:  q12 hours  -- Precautions: suicide, elopement, and assault -- Encouraged patient to participate in unit milieu and in scheduled group therapies  2. Psychiatric Diagnoses and Treatment:    Depakote 500 mg by mouth twice daily for mood stabilization and seizure d/o- will check Depakote levels -Continue Latuda to 80 mg daily.  Dose increased 02/18/2023 -Melatonin 5 mg at bedtime for insomnia -Effexor 75 mg to help with depression  - Vistaril increased to 100 mg at bedtime for Insomnia     3. Medical Issues Being Addressed:  Metoprolol for SVT and HTN   Ck down to 226, WNL. Patient encouraged to drink water and keep himself hydrated  4. Discharge Planning:   -- Social work and case management to assist with discharge planning and identification of hospital follow-up needs prior to discharge  -- Estimated LOS: 3-4 days  [name], MD 02/22/2023, 11:55 AM

## 2023-02-22 NOTE — Progress Notes (Signed)
   02/22/23 1101  Psych Admission Type (Psych Patients Only)  Admission Status Voluntary  Psychosocial Assessment  Patient Complaints Depression  Eye Contact Fair  Facial Expression Flat  Affect Flat  Speech Logical/coherent  Interaction Minimal  Motor Activity Slow  Appearance/Hygiene In scrubs  Behavior Characteristics Cooperative  Mood Sad  Thought Process  Coherency WDL  Content WDL  Delusions None reported or observed  Perception WDL  Hallucination None reported or observed  Judgment Impaired  Confusion None  Danger to Self  Current suicidal ideation? Passive  Description of Suicide Plan no plan  Self-Injurious Behavior No self-injurious ideation or behavior indicators observed or expressed   Agreement Not to Harm Self Yes  Description of Agreement verbal  Danger to Others  Danger to Others None reported or observed

## 2023-02-22 NOTE — Group Note (Signed)
Date:  02/22/2023 Time:  9:47 PM  Group Topic/Focus:  Coping With Mental Health Crisis:   The purpose of this group is to help patients identify strategies for coping with mental health crisis.  Group discusses possible causes of crisis and ways to manage them effectively.    Participation Level:    Participation Quality:    Affect:    Cognitive:    Insight:   Engagement in Group:   Modes of Intervention:    Additional Comments:    Jsaon Yoo 02/22/2023, 9:47 PM

## 2023-02-23 MED ORDER — DOXEPIN HCL 25 MG PO CAPS
25.0000 mg | ORAL_CAPSULE | Freq: Every day | ORAL | Status: DC
  Administered 2023-02-23 – 2023-02-24 (×2): 25 mg via ORAL
  Filled 2023-02-23 (×2): qty 1

## 2023-02-23 MED ORDER — VENLAFAXINE HCL ER 75 MG PO CP24
150.0000 mg | ORAL_CAPSULE | Freq: Every day | ORAL | Status: DC
  Administered 2023-02-24 – 2023-03-07 (×12): 150 mg via ORAL
  Filled 2023-02-23 (×12): qty 2

## 2023-02-23 NOTE — Progress Notes (Signed)
   02/22/23 2146  Psych Admission Type (Psych Patients Only)  Admission Status Voluntary  Psychosocial Assessment  Patient Complaints Depression  Eye Contact Brief  Facial Expression Flat  Affect Flat  Speech Logical/coherent  Interaction Minimal  Motor Activity Slow  Appearance/Hygiene Unremarkable  Behavior Characteristics Cooperative  Mood Sad  Aggressive Behavior  Effect No apparent injury  Thought Process  Coherency WDL  Content WDL  Delusions None reported or observed  Perception WDL  Hallucination None reported or observed  Judgment Impaired  Confusion None  Danger to Self  Current suicidal ideation? Passive  Description of Suicide Plan No plan  Self-Injurious Behavior No self-injurious ideation or behavior indicators observed or expressed   Agreement Not to Harm Self Yes  Description of Agreement Verbal  Danger to Others  Danger to Others None reported or observed

## 2023-02-23 NOTE — Progress Notes (Signed)
Physical Therapy Group Note  Group Topic:  Functional, Dynamic Balance Group Date: 02/23/2023 Group Time (start and end): 1610-9604 Facilitators:  Cephus Slater, PT  Group Description: Group discussed impact of balance on safety and independence with functional tasks.  Identified and discussed any self-perceived balance deficits to personalize information.  Discussed and reviewed strategies to address/improve balance deficits: use of assist devices, activity pacing/energy conservation, environment/home safety modifications, focusing attention/minimizing distraction.  Reviewed and participated with standing LE therex designed to target dynamic balance reactions and LE strength/stability; provided handouts with HEP to be utilized outside of group time as appropriate.  Allowed time for questions and further discussion on any balance or mobility concerns/needs.   Therapeutic Goal(s): Identify and discuss any individual balance deficits and functional implications. Identify and discuss any environmental/home safety modifications that can optimize balance and safety for mobility within the home. Demonstrate understanding and performance of standing therex designed to target dynamic balance deficits.   Individual Participation:  Patient actively engaged with discussion throughout group time, indep identifying balance deficits, compensatory strategies and home modifications to address.  Completed series of standing LE therex, intermittently using unilat vs no UE support on table-top for safety/stabilization as needed, supervision level of assist throughout. No overt buckling or LOB; fair/good safety awareness and insight throughout balance activities.  Concluded session by indep identifying that he learned the importance of "balance" during group today.   Participation Level and Quality: Active, alert and attentive   Behavior: Appropriate; pleasant and cooperative; speaks when spoken to    Speech/Thought Process: Organized, logical and reasonable   Affect/Mood: Appropriate   Insight: Appropriate   Judgement: Appropriate   Modes of Intervention: Verbal discussion, demonstration, handout provided     Plan: Continue to engage patient in PT/OT groups 1-2x/week.  Jediah Horger H. Manson Passey, PT, DPT, NCS 02/23/23, 1:58 PM [phone]

## 2023-02-23 NOTE — Plan of Care (Signed)
Problem: Education: Goal: Utilization of techniques to improve thought processes will improve Outcome: Progressing Goal: Knowledge of the prescribed therapeutic regimen will improve Outcome: Progressing   Problem: Activity: Goal: Interest or engagement in leisure activities will improve Outcome: Progressing Goal: Imbalance in normal sleep/wake cycle will improve Outcome: Progressing   Problem: Coping: Goal: Coping ability will improve Outcome: Progressing Goal: Will verbalize feelings Outcome: Progressing   Problem: Health Behavior/Discharge Planning: Goal: Ability to make decisions will improve Outcome: Progressing Goal: Compliance with therapeutic regimen will improve Outcome: Progressing   Problem: Role Relationship: Goal: Will demonstrate positive changes in social behaviors and relationships Outcome: Progressing   Problem: Safety: Goal: Ability to disclose and discuss suicidal ideas will improve Outcome: Progressing Goal: Ability to identify and utilize support systems that promote safety will improve Outcome: Progressing   Problem: Self-Concept: Goal: Will verbalize positive feelings about self Outcome: Progressing Goal: Level of anxiety will decrease Outcome: Progressing   Problem: Education: Goal: Ability to make informed decisions regarding treatment will improve Outcome: Progressing   Problem: Coping: Goal: Coping ability will improve Outcome: Progressing   Problem: Health Behavior/Discharge Planning: Goal: Identification of resources available to assist in meeting health care needs will improve Outcome: Progressing   Problem: Medication: Goal: Compliance with prescribed medication regimen will improve Outcome: Progressing   Problem: Self-Concept: Goal: Ability to disclose and discuss suicidal ideas will improve Outcome: Progressing Goal: Will verbalize positive feelings about self Outcome: Progressing

## 2023-02-23 NOTE — Progress Notes (Signed)
[facility] MD Progress Note  02/23/2023 2:49 PM [name]  MRN:  [id number] [name] is a 63 y.o. male who was initially medically admitted after presenting to [facility] on 02/12/23 for suicidal thoughts, seizures. Was noted to have elevated CK and admitted to hospitalist service for management of rhabdomyolysis. Patient is transferred to geropsych unit after he is medically cleared.   Subjective:  Chart reviewed, case discussed in multidisciplinary meeting, patient seen during rounds.  Patient is noted to be sitting in the dayroom participated in the groups.  He is taking his medications with no problems.  Patient reports that he is getting frustrated sitting in the dayroom.  He reports feeling hopeless and reports anhedonia.  He continues to endorse suicidal thoughts with no specific plan.  He endorses command type auditory hallucinations telling him to kill himself but is able to reach out to staff for help and safety.  She denies visual hallucinations.  He reports having problems with sleep and reports that he took hydroxyzine 100 mg with no benefit.  Provider discussed the plan to switch his hydroxyzine to doxepin.  Patient agreed with the plan. Sleep: Fair  Appetite:  Fair  Past Psychiatric History: see h&P Family History:  Family History  Problem Relation Age of Onset   CVA Mother        deceased at age 59   Depression Brother        Died by suicide at age 20   Social History:  Social History   Substance and Sexual Activity  Alcohol Use Not Currently   Comment: rare     Social History   Substance and Sexual Activity  Drug Use Yes   Types: Marijuana, Cocaine   Comment: last use January 2025, cocaine    Social History   Socioeconomic History   Marital status: Single    Spouse name: Not on file   Number of children: Not on file   Years of education: Not on file   Highest education level: Not on file  Occupational History   Not on file  Tobacco Use   Smoking  status: Former    Current packs/day: 0.00    Average packs/day: 0.8 packs/day for 20.0 years (15.0 ttl pk-yrs)    Types: Cigarettes    Start date: 05/16/1964    Quit date: 05/16/1984    Years since quitting: 38.8   Smokeless tobacco: Never  Vaping Use   Vaping status: Never Used  Substance and Sexual Activity   Alcohol use: Not Currently    Comment: rare   Drug use: Yes    Types: Marijuana, Cocaine    Comment: last use January 2025, cocaine   Sexual activity: Not Currently  Other Topics Concern   Not on file  Social History Narrative   ** Merged History Encounter **       Social Drivers of Health   Financial Resource Strain: Not on file  Food Insecurity: No Food Insecurity (02/15/2023)   Hunger Vital Sign    Worried About Running Out of Food in the Last Year: Never true    Ran Out of Food in the Last Year: Never true  Recent Concern: Food Insecurity - Food Insecurity Present (12/20/2022)   Hunger Vital Sign    Worried About Running Out of Food in the Last Year: Sometimes true    Ran Out of Food in the Last Year: Sometimes true  Transportation Needs: No Transportation Needs (02/15/2023)   PRAPARE - Transportation  Lack of Transportation (Medical): No    Lack of Transportation (Non-Medical): No  Recent Concern: Transportation Needs - Unmet Transportation Needs (02/14/2023)   PRAPARE - Administrator, Civil Service (Medical): Yes    Lack of Transportation (Non-Medical): Yes  Physical Activity: Not on file  Stress: Not on file  Social Connections: Socially Isolated (02/15/2023)   Social Connection and Isolation Panel [NHANES]    Frequency of Communication with Friends and Family: Once a week    Frequency of Social Gatherings with Friends and Family: Once a week    Attends Religious Services: Never    Database administrator or Organizations: No    Attends Engineer, structural: Never    Marital Status: Never married   Past Medical History:  Past Medical  History:  Diagnosis Date   Anxiety    Arthritis    knees and hands   Bipolar 1 disorder, depressed (HCC)    Bipolar disorder (HCC)    Depression    GERD (gastroesophageal reflux disease)    Hepatitis    HEP "C"   History of kidney stones    Hypertension    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 echo 5/17: EF 60-65%, no RWMA, LV dias fxn nl, LA mildly dilated, RV sys fxn nl, PASP nl, moderate sized circumferential pericardial effusion was identified, 2.12 cm around the LV free wall, <1 cm around the RV free wall. Features were not c/w tamponade physiology   PTSD (post-traumatic stress disorder)    Witnessed brother's suicide.   Restless leg syndrome    Seizures (HCC)    Syncope     Past Surgical History:  Procedure Laterality Date   APPENDECTOMY     CYSTOSCOPY WITH URETEROSCOPY AND STENT PLACEMENT     ESOPHAGOGASTRODUODENOSCOPY N/A 01/11/2016   Procedure: ESOPHAGOGASTRODUODENOSCOPY (EGD);  Surgeon: Charlott Rakes, MD;  Location: [facility] ENDOSCOPY;  Service: Endoscopy;  Laterality: N/A;   ESOPHAGOGASTRODUODENOSCOPY N/A 04/09/2020   Procedure: ESOPHAGOGASTRODUODENOSCOPY (EGD);  Surgeon: [name], MD;  Location: [facility] ENDOSCOPY;  Service: Gastroenterology;  Laterality: N/A;   INCISION AND DRAINAGE ABSCESS Left 01/02/2018   Procedure: INCISION AND DRAINAGE LEFT KNEE;  Surgeon: Deeann Saint, MD;  Location: [facility] ORS;  Service: Orthopedics;  Laterality: Left;   JOINT REPLACEMENT Right    TKR   KNEE ARTHROSCOPY Right 06/25/2014   Procedure: ARTHROSCOPY KNEE;  Surgeon: Deeann Saint, MD;  Location: [facility] ORS;  Service: Orthopedics;  Laterality: Right;  partial arthroscopic medial menisectomy   LAPAROSCOPIC APPENDECTOMY N/A 06/02/2021   Procedure: APPENDECTOMY LAPAROSCOPIC;  Surgeon: Campbell Lerner, MD;  Location: [facility] ORS;  Service: General;  Laterality: N/A;   TOTAL KNEE ARTHROPLASTY Right 04/22/2015   Procedure: TOTAL  KNEE ARTHROPLASTY;  Surgeon: Deeann Saint, MD;  Location: [facility] ORS;  Service: Orthopedics;  Laterality: Right;   TOTAL KNEE ARTHROPLASTY Left 10/30/2017   Procedure: TOTAL KNEE ARTHROPLASTY;  Surgeon: Deeann Saint, MD;  Location: [facility] ORS;  Service: Orthopedics;  Laterality: Left;   TOTAL KNEE REVISION Left 01/02/2018   Procedure: poly exchange of tibia and patella left knee;  Surgeon: Deeann Saint, MD;  Location: [facility] ORS;  Service: Orthopedics;  Laterality: Left;   UMBILICAL HERNIA REPAIR  06/02/2021   Procedure: HERNIA REPAIR UMBILICAL ADULT;  Surgeon: Campbell Lerner, MD;  Location: [facility] ORS;  Service: General;;    Current Medications: Current Facility-Administered Medications  Medication Dose Route Frequency Provider Last Rate Last Admin  acetaminophen (TYLENOL) tablet 650 mg  650 mg Oral Q6H PRN Lauree Chandler, NP   650 mg at 02/17/23 2127   Or   acetaminophen (TYLENOL) suppository 650 mg  650 mg Rectal Q6H PRN Lauree Chandler, NP       divalproex (DEPAKOTE) DR tablet 500 mg  500 mg Oral BID Lewanda Rife, MD   500 mg at 02/23/23 9326   doxepin (SINEQUAN) capsule 25 mg  25 mg Oral QHS [name], MD       feeding supplement (ENSURE ENLIVE / ENSURE PLUS) liquid 237 mL  237 mL Oral TID BM Lewanda Rife, MD   237 mL at 02/23/23 0929   hydrOXYzine (ATARAX) tablet 25 mg  25 mg Oral Q6H PRN [name], MD   25 mg at 02/23/23 0929   levOCARNitine (CARNITOR) 1 GM/10ML solution 500 mg  500 mg Oral Daily Lauree Chandler, NP   500 mg at 02/21/23 1048   lurasidone (LATUDA) tablet 80 mg  80 mg Oral Q supper Lewanda Rife, MD   80 mg at 02/22/23 1747   melatonin tablet 5 mg  5 mg Oral QHS Lewanda Rife, MD   5 mg at 02/21/23 2126   metoprolol succinate (TOPROL-XL) 24 hr tablet 50 mg  50 mg Oral Daily Lewanda Rife, MD   50 mg at 02/23/23 7124   multivitamin with minerals tablet 1 tablet  1 tablet Oral Daily Lewanda Rife, MD   1 tablet at 02/23/23  0928   OLANZapine (ZYPREXA) injection 5 mg  5 mg Intramuscular TID PRN Lauree Chandler, NP       OLANZapine zydis (ZYPREXA) disintegrating tablet 5 mg  5 mg Oral TID PRN Lauree Chandler, NP       tamsulosin [facility]) capsule 0.4 mg  0.4 mg Oral QPC supper Lewanda Rife, MD   0.4 mg at 02/22/23 1747   temazepam (RESTORIL) capsule 15 mg  15 mg Oral QHS Lewanda Rife, MD   15 mg at 02/22/23 2146   traMADol (ULTRAM) tablet 50 mg  50 mg Oral Q6H PRN Lewanda Rife, MD   50 mg at 02/23/23 0929   venlafaxine XR (EFFEXOR-XR) 24 hr capsule 75 mg  75 mg Oral QPC breakfast Lewanda Rife, MD   75 mg at 02/23/23 5809    Lab Results:  No results found for this or any previous visit (from the past 48 hours).   Blood Alcohol level:  Lab Results  Component Value Date   ETH <10 02/12/2023   ETH <10 12/18/2022    Metabolic Disorder Labs: Lab Results  Component Value Date   HGBA1C 5.5 12/29/2022   MPG 111.15 12/29/2022   MPG 111 04/16/2022   No results found for: "PROLACTIN" Lab Results  Component Value Date   CHOL 208 (H) 12/29/2022   TRIG 145 12/29/2022   HDL 33 (L) 12/29/2022   CHOLHDL 6.3 12/29/2022   VLDL 29 12/29/2022   LDLCALC 146 (H) 12/29/2022   LDLCALC 125 (H) 04/16/2022    Physical Findings: AIMS:  , ,  ,  ,    CIWA:    COWS:      Psychiatric Specialty Exam:  Presentation  General Appearance:  Appropriate for Environment; Casual  Eye Contact: Fair  Speech: Clear and Coherent  Speech Volume: Normal    Mood and Affect  Mood: Dysphoric; Hopeless  Affect: Depressed   Thought Process  Thought Processes: Coherent  Descriptions of Associations:Intact  Orientation:Full (Time, Place and Person)  Thought Content:Logical  Hallucinations:Hallucinations: Auditory  Ideas of Reference:None  Suicidal Thoughts:Suicidal Thoughts: Yes, Passive SI Active Intent and/or Plan: Without Intent; Without Plan SI Passive Intent and/or Plan:  Without Intent; Without Plan  Homicidal Thoughts:Homicidal Thoughts: No   Sensorium  Memory: Immediate Fair; Recent Fair; Remote Fair  Judgment: Impaired  Insight: Shallow   Executive Functions  Concentration: Fair  Attention Span: Fair  Recall: Fair  Fund of Knowledge: Fair  Language: Fair   Psychomotor Activity  Psychomotor Activity: Psychomotor Activity: Normal  Musculoskeletal: Strength & Muscle Tone: within normal limits Gait & Station: normal Assets  Assets: Manufacturing systems engineer; Desire for Improvement; Physical Health    Physical Exam: Physical Exam Vitals and nursing note reviewed.  HENT:     Head: Normocephalic.     Nose: Nose normal.     Mouth/Throat:     Mouth: Mucous membranes are moist.  Eyes:     Pupils: Pupils are equal, round, and reactive to light.  Cardiovascular:     Rate and Rhythm: Normal rate.  Pulmonary:     Breath sounds: Normal breath sounds.  Abdominal:     General: Bowel sounds are normal.  Skin:    General: Skin is warm.  Neurological:     General: No focal deficit present.     Mental Status: He is alert.    Review of Systems  Constitutional: Negative.   HENT: Negative.    Eyes: Negative.   Respiratory: Negative.    Cardiovascular: Negative.   Gastrointestinal: Negative.   Skin: Negative.   Neurological: Negative.    Blood pressure 98/77, pulse 83, temperature 97.9 F (36.6 C), resp. rate 18, SpO2 97%. There is no height or weight on file to calculate BMI.  Diagnosis: Principal Problem:   Bipolar disorder, current episode depressed, severe, with psychotic features (HCC) Active Problems:   Cocaine use   Rhabdomyolysis   Seizures (HCC)   PLAN: Safety and Monitoring:  -- Voluntary admission to inpatient psychiatric unit for safety, stabilization and treatment  -- Daily contact with patient to assess and evaluate symptoms and progress in treatment  -- Patient's case to be discussed in  multi-disciplinary team meeting  -- Observation Level : q15 minute checks  -- Vital signs:  q12 hours  -- Precautions: suicide, elopement, and assault -- Encouraged patient to participate in unit milieu and in scheduled group therapies  2. Psychiatric Diagnoses and Treatment:    Depakote 500 mg by mouth twice daily for mood stabilization and seizure d/o- will check Depakote levels -Continue Latuda to 80 mg daily.  Dose increased 02/18/2023 -Melatonin 5 mg at bedtime for insomnia -Increase Effexor XR 150 mg to help with depression  - Vistaril increased to 100 mg at bedtime for Insomnia     3. Medical Issues Being Addressed:  Metoprolol for SVT and HTN   Ck down to 226, WNL. Patient encouraged to drink water and keep himself hydrated  4. Discharge Planning:   -- Social work and case management to assist with discharge planning and identification of hospital follow-up needs prior to discharge  -- Estimated LOS: 3-4 days  [name], MD 02/23/2023, 2:49 PM

## 2023-02-23 NOTE — Group Note (Signed)
Date:  02/23/2023 Time:  10:01 PM  Group Topic/Focus:  Self Care:   The focus of this group is to help patients understand the importance of self-care in order to improve or restore emotional, physical, spiritual, interpersonal, and financial health.    Participation Level:  Did Not Attend   Additional Comments:    [name] 02/23/2023, 10:01 PM

## 2023-02-23 NOTE — Group Note (Signed)
Recreation Therapy Group Note   Group Topic:Problem Solving  Group Date: 02/23/2023 Start Time: 1400 End Time: 1450 Facilitators: Rosina Lowenstein, LRT, CTRS Location:  Dayroom  Group Description: Life Boat. Patients were given the scenario that they are on a boat that is about to become shipwrecked, leaving them stranded on an [place]. They are asked to make a list of 15 different items that they want to take with them when they are stranded on the [place]. Patients are asked to rank their items from most important to least important, #1 being the most important and #15 being the least. Patients will work individually for the first round to come up with 15 items and then pair up with a peer(s) to condense their list and come up with one list of 15 items between the two of them. Patients or LRT will read aloud the 15 different items to the group after each round. LRT facilitated post-activity processing to discuss how this activity can be used in daily life post discharge.   Goal Area(s) Addressed:  Patient will identify priorities, wants and needs. Patient will communicate with LRT and peers. Patient will work collectively as a Administrator, Civil Service. Patient will work on Product manager.    Affect/Mood: Appropriate and Flat   Participation Level: Moderate   Participation Quality: Independent   Behavior: Calm and Cooperative   Speech/Thought Process: Coherent   Insight: Limited   Judgement: Fair    Modes of Intervention: Group work, Guided Discussion, Education administrator, Dentist, Dance movement psychotherapist, and Team-building   Patient Response to Interventions:  Receptive   Education Outcome:  Acknowledges education   Clinical Observations/Individualized Feedback: [name] was somewhat active in their participation of session activities and group discussion. Pt identified "food, water, rope, knife, radio and bug spray" as items he will bring with him. Pt minimally interacted with LRT and  peers while present in group.    Plan: Continue to engage patient in RT group sessions 2-3x/week.   Rosina Lowenstein, LRT, CTRS 02/23/2023 5:06 PM

## 2023-02-23 NOTE — Plan of Care (Signed)
D: Pt alert and oriented. Pt reports experiencing anxiety/depression at this time. Pt reports experiencing 8/10 neck pain at this time, prn medication given. Pt denies experiencing any HI, or VH at this time however endorses SI w/o plan while here. Pt contracts for safety and will notify staff if anything changes. Pt also endorses AH of voices telling him to kill himself.   A: Scheduled medications administered to pt, per MD orders. Support and encouragement provided. Frequent verbal contact made. Routine safety checks conducted q15 minutes.   R: No adverse drug reactions noted. Pt verbally contracts for safety at this time. Pt compliant with medications and treatment plan. Pt interacts well but minimally with others on the unit. Pt remains safe at this time. Plan of care ongoing.  Problem: Education: Goal: Utilization of techniques to improve thought processes will improve Outcome: Not Progressing   Problem: Coping: Goal: Coping ability will improve Outcome: Not Progressing

## 2023-02-23 NOTE — Progress Notes (Signed)
   02/23/23 0600  15 Minute Checks  Location Bedroom  Visual Appearance Calm  Behavior Sleeping  Sleep (Behavioral Health Patients Only)  Calculate sleep? (Click Yes once per 24 hr at 0600 safety check) Yes  Documented sleep last 24 hours 11

## 2023-02-24 NOTE — Plan of Care (Signed)
D: Pt alert and oriented. Pt reports experiencing anxiety/depression at this time. Pt reports experiencing 8/10 posterior neck pain at this time, prn medication given. Pt denies experiencing any HI, or VH at this time however endorses SI w/o plan while here and contracts for safety. Pt states he will notify staff if anything changes. Pt endorses AH as well, stating the voices are telling him to kill himself.   A: Scheduled medications administered to pt, per MD orders. Support and encouragement provided. Frequent verbal contact made. Routine safety checks conducted q15 minutes.   R: No adverse drug reactions noted. Pt verbally contracts for safety at this time. Pt compliant with medications and treatment plan. Pt interacts well with others on the unit. Pt remains safe at this time. Plan of care ongoing.  Pt refused scheduled levocarnitine. Pt states it is causing him diarrhea. MD notified and aware.  Problem: Coping: Goal: Coping ability will improve Outcome: Not Progressing   Problem: Self-Concept: Goal: Level of anxiety will decrease Outcome: Not Progressing

## 2023-02-24 NOTE — Plan of Care (Signed)
Problem: Coping: Goal: Coping ability will improve Outcome: Progressing Goal: Will verbalize feelings Outcome: Progressing

## 2023-02-24 NOTE — Progress Notes (Signed)
[facility] MD Progress Note  02/24/2023 10:58 AM [name] Colon Chewning  MRN:  [id number] [name] is a 63 y.o. male who was initially medically admitted after presenting to [facility] on 02/12/23 for suicidal thoughts, seizures. Was noted to have elevated CK and admitted to hospitalist service for management of rhabdomyolysis. Patient is transferred to geropsych unit after he is medically cleared.   Subjective:  Chart reviewed, case discussed in multidisciplinary meeting, patient seen during rounds.  Patient is seen in the hallway and patient spoke to the provider very briefly.  He is noted to be participating in the groups.  He continues to feel hopeless and depressed with suicidal ideation and auditory hallucinations.  Provider discussed adjusting the nighttime sleep aid depending on how the patient does. Sleep: Fair  Appetite:  Fair  Past Psychiatric History: see h&P Family History:  Family History  Problem Relation Age of Onset   CVA Mother        deceased at age 75   Depression Brother        Died by suicide at age 17   Social History:  Social History   Substance and Sexual Activity  Alcohol Use Not Currently   Comment: rare     Social History   Substance and Sexual Activity  Drug Use Yes   Types: Marijuana, Cocaine   Comment: last use January 2025, cocaine    Social History   Socioeconomic History   Marital status: Single    Spouse name: Not on file   Number of children: Not on file   Years of education: Not on file   Highest education level: Not on file  Occupational History   Not on file  Tobacco Use   Smoking status: Former    Current packs/day: 0.00    Average packs/day: 0.8 packs/day for 20.0 years (15.0 ttl pk-yrs)    Types: Cigarettes    Start date: 05/16/1964    Quit date: 05/16/1984    Years since quitting: 38.8   Smokeless tobacco: Never  Vaping Use   Vaping status: Never Used  Substance and Sexual Activity   Alcohol use: Not Currently    Comment: rare    Drug use: Yes    Types: Marijuana, Cocaine    Comment: last use January 2025, cocaine   Sexual activity: Not Currently  Other Topics Concern   Not on file  Social History Narrative   ** Merged History Encounter **       Social Drivers of Health   Financial Resource Strain: Not on file  Food Insecurity: No Food Insecurity (02/15/2023)   Hunger Vital Sign    Worried About Running Out of Food in the Last Year: Never true    Ran Out of Food in the Last Year: Never true  Recent Concern: Food Insecurity - Food Insecurity Present (12/20/2022)   Hunger Vital Sign    Worried About Running Out of Food in the Last Year: Sometimes true    Ran Out of Food in the Last Year: Sometimes true  Transportation Needs: No Transportation Needs (02/15/2023)   PRAPARE - Administrator, Civil Service (Medical): No    Lack of Transportation (Non-Medical): No  Recent Concern: Transportation Needs - Unmet Transportation Needs (02/14/2023)   PRAPARE - Transportation    Lack of Transportation (Medical): Yes    Lack of Transportation (Non-Medical): Yes  Physical Activity: Not on file  Stress: Not on file  Social Connections: Socially Isolated (02/15/2023)   Social  Connection and Isolation Panel [NHANES]    Frequency of Communication with Friends and Family: Once a week    Frequency of Social Gatherings with Friends and Family: Once a week    Attends Religious Services: Never    Database administrator or Organizations: No    Attends Engineer, structural: Never    Marital Status: Never married   Past Medical History:  Past Medical History:  Diagnosis Date   Anxiety    Arthritis    knees and hands   Bipolar 1 disorder, depressed (HCC)    Bipolar disorder (HCC)    Depression    GERD (gastroesophageal reflux disease)    Hepatitis    HEP "C"   History of kidney stones    Hypertension    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 echo 5/17: EF 60-65%, no RWMA, LV dias fxn nl, LA mildly dilated, RV sys fxn nl, PASP nl, moderate sized circumferential pericardial effusion was identified, 2.12 cm around the LV free wall, <1 cm around the RV free wall. Features were not c/w tamponade physiology   PTSD (post-traumatic stress disorder)    Witnessed brother's suicide.   Restless leg syndrome    Seizures (HCC)    Syncope     Past Surgical History:  Procedure Laterality Date   APPENDECTOMY     CYSTOSCOPY WITH URETEROSCOPY AND STENT PLACEMENT     ESOPHAGOGASTRODUODENOSCOPY N/A 01/11/2016   Procedure: ESOPHAGOGASTRODUODENOSCOPY (EGD);  Surgeon: Charlott Rakes, MD;  Location: [facility] ENDOSCOPY;  Service: Endoscopy;  Laterality: N/A;   ESOPHAGOGASTRODUODENOSCOPY N/A 04/09/2020   Procedure: ESOPHAGOGASTRODUODENOSCOPY (EGD);  Surgeon: [name], MD;  Location: [facility] ENDOSCOPY;  Service: Gastroenterology;  Laterality: N/A;   INCISION AND DRAINAGE ABSCESS Left 01/02/2018   Procedure: INCISION AND DRAINAGE LEFT KNEE;  Surgeon: Deeann Saint, MD;  Location: [facility] ORS;  Service: Orthopedics;  Laterality: Left;   JOINT REPLACEMENT Right    TKR   KNEE ARTHROSCOPY Right 06/25/2014   Procedure: ARTHROSCOPY KNEE;  Surgeon: Deeann Saint, MD;  Location: [facility] ORS;  Service: Orthopedics;  Laterality: Right;  partial arthroscopic medial menisectomy   LAPAROSCOPIC APPENDECTOMY N/A 06/02/2021   Procedure: APPENDECTOMY LAPAROSCOPIC;  Surgeon: Campbell Lerner, MD;  Location: [facility] ORS;  Service: General;  Laterality: N/A;   TOTAL KNEE ARTHROPLASTY Right 04/22/2015   Procedure: TOTAL KNEE ARTHROPLASTY;  Surgeon: Deeann Saint, MD;  Location: [facility] ORS;  Service: Orthopedics;  Laterality: Right;   TOTAL KNEE ARTHROPLASTY Left 10/30/2017   Procedure: TOTAL KNEE ARTHROPLASTY;  Surgeon: Deeann Saint, MD;  Location: [facility] ORS;  Service: Orthopedics;  Laterality: Left;   TOTAL KNEE REVISION Left 01/02/2018   Procedure: poly exchange of tibia and  patella left knee;  Surgeon: Deeann Saint, MD;  Location: [facility] ORS;  Service: Orthopedics;  Laterality: Left;   UMBILICAL HERNIA REPAIR  06/02/2021   Procedure: HERNIA REPAIR UMBILICAL ADULT;  Surgeon: Campbell Lerner, MD;  Location: [facility] ORS;  Service: General;;    Current Medications: Current Facility-Administered Medications  Medication Dose Route Frequency Provider Last Rate Last Admin   acetaminophen (TYLENOL) tablet 650 mg  650 mg Oral Q6H PRN Lauree Chandler, NP   650 mg at 02/17/23 2127   Or   acetaminophen (TYLENOL) suppository 650 mg  650 mg Rectal Q6H PRN Lauree Chandler, NP       divalproex (DEPAKOTE) DR tablet 500 mg  500 mg Oral BID Lewanda Rife, MD  500 mg at 02/24/23 0816   doxepin (SINEQUAN) capsule 25 mg  25 mg Oral QHS [name], MD   25 mg at 02/23/23 2216   feeding supplement (ENSURE ENLIVE / ENSURE PLUS) liquid 237 mL  237 mL Oral TID BM Lewanda Rife, MD   237 mL at 02/24/23 0944   hydrOXYzine (ATARAX) tablet 25 mg  25 mg Oral Q6H PRN [name], MD   25 mg at 02/24/23 0817   levOCARNitine (CARNITOR) 1 GM/10ML solution 500 mg  500 mg Oral Daily Lauree Chandler, NP   500 mg at 02/21/23 1048   lurasidone (LATUDA) tablet 80 mg  80 mg Oral Q supper Lewanda Rife, MD   80 mg at 02/23/23 1658   melatonin tablet 5 mg  5 mg Oral QHS Lewanda Rife, MD   5 mg at 02/21/23 2126   metoprolol succinate (TOPROL-XL) 24 hr tablet 50 mg  50 mg Oral Daily Lewanda Rife, MD   50 mg at 02/24/23 4098   multivitamin with minerals tablet 1 tablet  1 tablet Oral Daily Lewanda Rife, MD   1 tablet at 02/24/23 0816   OLANZapine (ZYPREXA) injection 5 mg  5 mg Intramuscular TID PRN Lauree Chandler, NP       OLANZapine zydis (ZYPREXA) disintegrating tablet 5 mg  5 mg Oral TID PRN Lauree Chandler, NP       tamsulosin [facility]) capsule 0.4 mg  0.4 mg Oral QPC supper Lewanda Rife, MD   0.4 mg at 02/23/23 1700   temazepam (RESTORIL) capsule  15 mg  15 mg Oral QHS Lewanda Rife, MD   15 mg at 02/23/23 2215   traMADol (ULTRAM) tablet 50 mg  50 mg Oral Q6H PRN Lewanda Rife, MD   50 mg at 02/24/23 0816   venlafaxine XR (EFFEXOR-XR) 24 hr capsule 150 mg  150 mg Oral QPC breakfast [name], MD   150 mg at 02/24/23 1191    Lab Results:  No results found for this or any previous visit (from the past 48 hours).   Blood Alcohol level:  Lab Results  Component Value Date   ETH <10 02/12/2023   ETH <10 12/18/2022    Metabolic Disorder Labs: Lab Results  Component Value Date   HGBA1C 5.5 12/29/2022   MPG 111.15 12/29/2022   MPG 111 04/16/2022   No results found for: "PROLACTIN" Lab Results  Component Value Date   CHOL 208 (H) 12/29/2022   TRIG 145 12/29/2022   HDL 33 (L) 12/29/2022   CHOLHDL 6.3 12/29/2022   VLDL 29 12/29/2022   LDLCALC 146 (H) 12/29/2022   LDLCALC 125 (H) 04/16/2022    Physical Findings: AIMS:  , ,  ,  ,    CIWA:    COWS:      Psychiatric Specialty Exam:  Presentation  General Appearance:  Appropriate for Environment; Casual  Eye Contact: Fair  Speech: Clear and Coherent  Speech Volume: Decreased    Mood and Affect  Mood: Depressed; Hopeless  Affect: Depressed   Thought Process  Thought Processes: Coherent  Descriptions of Associations:Intact  Orientation:Full (Time, Place and Person)  Thought Content:Illogical  Hallucinations:Hallucinations: Auditory  Ideas of Reference:None  Suicidal Thoughts:Suicidal Thoughts: Yes, Active SI Passive Intent and/or Plan: Without Plan; Without Intent  Homicidal Thoughts:Homicidal Thoughts: No   Sensorium  Memory: Immediate Fair; Recent Fair; Remote Fair  Judgment: Impaired  Insight: Shallow   Executive Functions  Concentration: Poor  Attention Span: Fair  Recall: Fair  Fund of Knowledge: Fair  Language: Fair   Psychomotor Activity  Psychomotor Activity: Psychomotor Activity:  Normal  Musculoskeletal: Strength & Muscle Tone: within normal limits Gait & Station: normal Assets  Assets: Manufacturing systems engineer; Desire for Improvement; Physical Health    Physical Exam: Physical Exam Vitals and nursing note reviewed.  HENT:     Head: Normocephalic.     Nose: Nose normal.     Mouth/Throat:     Mouth: Mucous membranes are moist.  Eyes:     Pupils: Pupils are equal, round, and reactive to light.  Cardiovascular:     Rate and Rhythm: Normal rate.  Pulmonary:     Breath sounds: Normal breath sounds.  Abdominal:     General: Bowel sounds are normal.  Skin:    General: Skin is warm.  Neurological:     General: No focal deficit present.     Mental Status: He is alert.    Review of Systems  Constitutional: Negative.   HENT: Negative.    Eyes: Negative.   Respiratory: Negative.    Cardiovascular: Negative.   Gastrointestinal: Negative.   Skin: Negative.   Neurological: Negative.    Blood pressure 116/75, pulse 70, temperature 97.6 F (36.4 C), resp. rate 18, SpO2 97%. There is no height or weight on file to calculate BMI.  Diagnosis: Principal Problem:   Bipolar disorder, current episode depressed, severe, with psychotic features (HCC) Active Problems:   Cocaine use   Rhabdomyolysis   Seizures (HCC)   PLAN: Safety and Monitoring:  -- Voluntary admission to inpatient psychiatric unit for safety, stabilization and treatment  -- Daily contact with patient to assess and evaluate symptoms and progress in treatment  -- Patient's case to be discussed in multi-disciplinary team meeting  -- Observation Level : q15 minute checks  -- Vital signs:  q12 hours  -- Precautions: suicide, elopement, and assault -- Encouraged patient to participate in unit milieu and in scheduled group therapies  2. Psychiatric Diagnoses and Treatment:    Depakote 500 mg by mouth twice daily for mood stabilization and seizure d/o- will check Depakote levels -Continue Latuda  to 80 mg daily.  Dose increased 02/18/2023 -Melatonin 5 mg at bedtime for insomnia -Increase Effexor XR 150 mg to help with depression  - Vistaril increased to 100 mg at bedtime for Insomnia     3. Medical Issues Being Addressed:  Metoprolol for SVT and HTN   Ck down to 226, WNL. Patient encouraged to drink water and keep himself hydrated  4. Discharge Planning:   -- Social work and case management to assist with discharge planning and identification of hospital follow-up needs prior to discharge  -- Estimated LOS: 3-4 days  [name], MD 02/24/2023, 10:58 AM

## 2023-02-24 NOTE — Progress Notes (Signed)
   02/24/23 2100  Psych Admission Type (Psych Patients Only)  Admission Status Voluntary  Psychosocial Assessment  Patient Complaints Anxiety;Depression  Eye Contact Brief  Facial Expression Flat  Affect Depressed  Speech Logical/coherent  Interaction Minimal  Motor Activity Slow  Appearance/Hygiene Unremarkable  Behavior Characteristics Cooperative  Mood Sullen  Thought Process  Coherency WDL  Content WDL  Delusions None reported or observed  Perception Hallucinations  Hallucination Auditory  Judgment Impaired  Confusion None  Danger to Self  Current suicidal ideation? Passive  Description of Suicide Plan none  Self-Injurious Behavior No self-injurious ideation or behavior indicators observed or expressed   Agreement Not to Harm Self Yes  Description of Agreement VERBAL  Danger to Others  Danger to Others None reported or observed

## 2023-02-24 NOTE — Group Note (Signed)
Recreation Therapy Group Note   Group Topic:Communication  Group Date: 02/24/2023 Start Time: 1400 End Time: 1445 Facilitators: Rosina Lowenstein, LRT, CTRS Location:  Dayroom  Group Description: Emotional Check in. Patient sat and talked with LRT about how they are doing and whatever else is on their mind. LRT provided active listening, reassurance and encouragement. Pts were given the opportunity to listen to music or color mandalas while they talk.     Goal Area(s) Addressed:  Patient will engage in conversation with LRT.  Patient will communicate their wants, needs, or questions.   Patient will practice a new coping skill of "talking to someone".    Affect/Mood: Appropriate and Flat   Participation Level: Minimal   Participation Quality: Independent   Behavior: Calm and Cooperative   Speech/Thought Process: Coherent   Insight: Fair   Judgement: Fair    Modes of Intervention: Open Conversation, Rapport Building, Socialization, and Support   Patient Response to Interventions:  Receptive   Education Outcome:  Acknowledges education   Clinical Observations/Individualized Feedback: Eligah was somewhat active in their participation of session activities and group discussion. Pt was present in the dayroom at the time of group. Pt sat alone away from the group, despite encouragement. Pt minimally interacted with LRT and peers duration of session.    Plan: Continue to engage patient in RT group sessions 2-3x/week.   Rosina Lowenstein, LRT, CTRS 02/24/2023 4:04 PM

## 2023-02-24 NOTE — Progress Notes (Signed)
Occupational Therapy Group Note  Group Topic:  Pain Management and Coping Group Date: 02/24/2023 Group Time (start and end): 1:00pm - 1:40pm Facilitators:  Wynona Canes, OTR/L  Group Description:  Group discussed impact of chronic/acute pain on safety and independence with functional tasks and impact on mental health.  Identified and discussed previously learned or implemented strategies used and whether these strategies were effective or not. Group educated in the [employer] of Pain and instructed in evidence-based strategies to address improved overall management of pain in conjunction with any individual medical intervention provided by care team. Specific strategies discussed included distraction and pleasant imagery, engagement in meaningful occupations, and two forms of progressive muscle relaxation (mini version to complete while sitting and full version to complete while lying down). Discussed and reviewed how to incorporate learned cognitive behavioral pain coping strategies into daily routines. Allowed time for questions and further discussion of individual experiences.    Therapeutic Goal(s):   Identify and discuss previously utilized pain coping strategies and implications of pain on function/well-being Identify and discuss implementing new cognitive behavioral pain coping strategies into daily routines Demonstrate understanding and performance of learned cognitive behavioral pain coping strategies.    Individual Participation: Pt attended group and was minimally engaged throughout. Offered personal accounts of chronic pain history and verbalized understanding of instruction provided when prompted.     Participation Level and Quality: Engaged, limited sharing/reflection requiring prompting    Behavior: Appropriate   Insight: Able to identify used strategies to support pain control when prompted   Modes of Intervention: Visual and return demonstration, handout     Plan:  Continue to engage patient in PT/OT groups 1-2x/week.   Arman Filter., MPH, MS, OTR/L ascom [phone] 02/24/23, 4:23 PM

## 2023-02-25 LAB — VALPROIC ACID LEVEL: Valproic Acid Lvl: 31 ug/mL — ABNORMAL LOW (ref 50.0–100.0)

## 2023-02-25 MED ORDER — DOXEPIN HCL 50 MG PO CAPS
50.0000 mg | ORAL_CAPSULE | Freq: Every day | ORAL | Status: DC
  Administered 2023-02-25 – 2023-03-06 (×10): 50 mg via ORAL
  Filled 2023-02-25 (×10): qty 1

## 2023-02-25 NOTE — Group Note (Signed)
LCSW Group Therapy Note  Group Date: 02/25/2023 Start Time: 1310 End Time: 1350   Type of Therapy and Topic:  Group Therapy: Positive Affirmations  Participation Level:  Minimal   Description of Group:   This group addressed positive affirmation towards self and others.  Patients went around the room and identified two positive things about themselves and two positive things about a peer in the room.  Patients reflected on how it felt to share something positive with others, to identify positive things about themselves, and to hear positive things from others/ Patients were encouraged to have a daily reflection of positive characteristics or circumstances.   Therapeutic Goals: Patients will verbalize two of their positive qualities Patients will demonstrate empathy for others by stating two positive qualities about a peer in the group Patients will verbalize their feelings when voicing positive self affirmations and when voicing positive affirmations of others Patients will discuss the potential positive impact on their wellness/recovery of focusing on positive traits of self and others.  Summary of Patient Progress:  The patient minimally engaged in the discussion. He was not able to identify positive affirmations about himself as well as other group members. Patient demonstrated poor insight into the subject matter, was respectful of peers, and minimally participated throughout the entire session.  Therapeutic Modalities:   Cognitive Behavioral Therapy Motivational Interviewing    Azucena Kuba, Theresia Majors 02/25/2023  3:57 PM

## 2023-02-25 NOTE — Plan of Care (Addendum)
D: Pt alert and oriented. Pt reports experiencing anxiety/depression. Pt reports experiencing 8/10 neck pain at this time, prn medication given. Pt denies experiencing any HI, or VH at this time however endorses SI and AH. Pt state he has not plan while here and contracts for safety. Pt reports he will notify staff if anything changes. Pt also reports hearing voices to kill himself however has no intentions of acting on it while here.   A: Scheduled medications administered to pt, per MD orders. Support and encouragement provided. Frequent verbal contact made. Routine safety checks conducted q15 minutes.   R: No adverse drug reactions noted. Pt verbally contracts for safety at this time. Pt compliant with medications and treatment plan. Pt interacts minimally with others on the unit, mostly self isolating to his room. Pt remains safe at this time. Plan of care ongoing.  Pt did not eat lunch after multiple attempts from staff and is unsure if he is going to eat dinner. Pt states we can not make him eat if he isn't hungry.   Pt refused levocarnitine. Pt states it causes diarrhea, MD notified and aware.   Problem: Education: Goal: Utilization of techniques to improve thought processes will improve Outcome: Not Progressing   Problem: Activity: Goal: Interest or engagement in leisure activities will improve Outcome: Not Progressing

## 2023-02-25 NOTE — Progress Notes (Signed)
[facility] MD Progress Note  02/25/2023 10:52 AM [name]  MRN:  [id number] [name] is a 63 y.o. male who was initially medically admitted after presenting to [facility] on 02/12/23 for suicidal thoughts, seizures. Was noted to have elevated CK and admitted to hospitalist service for management of rhabdomyolysis. Patient is transferred to geropsych unit after he is medically cleared.   Subjective:  Chart reviewed, case discussed in multidisciplinary meeting, patient seen during rounds.  Patient is noted to be resting in his bed.  He continues to endorse feeling hopelessness feeling depressed.  He continues to endorse auditory hallucinations telling him to kill himself.  With medications he reports that the voices are getting less in order.  He continues to endorse SI no specific plan.  He denies HI.  He denies visual hallucination.  Per nursing report patient is participating in groups, taking his medications.  Patient reports few hours of sleep with doxepin 25 mg, consented to increase the doxepin to 50 mg at bedtime. Sleep: Fair  Appetite:  Fair  Past Psychiatric History: see h&P Family History:  Family History  Problem Relation Age of Onset   CVA Mother        deceased at age 81   Depression Brother        Died by suicide at age 9   Social History:  Social History   Substance and Sexual Activity  Alcohol Use Not Currently   Comment: rare     Social History   Substance and Sexual Activity  Drug Use Yes   Types: Marijuana, Cocaine   Comment: last use January 2025, cocaine    Social History   Socioeconomic History   Marital status: Single    Spouse name: Not on file   Number of children: Not on file   Years of education: Not on file   Highest education level: Not on file  Occupational History   Not on file  Tobacco Use   Smoking status: Former    Current packs/day: 0.00    Average packs/day: 0.8 packs/day for 20.0 years (15.0 ttl pk-yrs)    Types: Cigarettes     Start date: 05/16/1964    Quit date: 05/16/1984    Years since quitting: 38.8   Smokeless tobacco: Never  Vaping Use   Vaping status: Never Used  Substance and Sexual Activity   Alcohol use: Not Currently    Comment: rare   Drug use: Yes    Types: Marijuana, Cocaine    Comment: last use January 2025, cocaine   Sexual activity: Not Currently  Other Topics Concern   Not on file  Social History Narrative   ** Merged History Encounter **       Social Drivers of Health   Financial Resource Strain: Not on file  Food Insecurity: No Food Insecurity (02/15/2023)   Hunger Vital Sign    Worried About Running Out of Food in the Last Year: Never true    Ran Out of Food in the Last Year: Never true  Recent Concern: Food Insecurity - Food Insecurity Present (12/20/2022)   Hunger Vital Sign    Worried About Running Out of Food in the Last Year: Sometimes true    Ran Out of Food in the Last Year: Sometimes true  Transportation Needs: No Transportation Needs (02/15/2023)   PRAPARE - Administrator, Civil Service (Medical): No    Lack of Transportation (Non-Medical): No  Recent Concern: Transportation Needs - Unmet Transportation Needs (  02/14/2023)   PRAPARE - Administrator, Civil Service (Medical): Yes    Lack of Transportation (Non-Medical): Yes  Physical Activity: Not on file  Stress: Not on file  Social Connections: Socially Isolated (02/15/2023)   Social Connection and Isolation Panel [NHANES]    Frequency of Communication with Friends and Family: Once a week    Frequency of Social Gatherings with Friends and Family: Once a week    Attends Religious Services: Never    Database administrator or Organizations: No    Attends Engineer, structural: Never    Marital Status: Never married   Past Medical History:  Past Medical History:  Diagnosis Date   Anxiety    Arthritis    knees and hands   Bipolar 1 disorder, depressed (HCC)    Bipolar disorder (HCC)     Depression    GERD (gastroesophageal reflux disease)    Hepatitis    HEP "C"   History of kidney stones    Hypertension    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 echo 5/17: EF 60-65%, no RWMA, LV dias fxn nl, LA mildly dilated, RV sys fxn nl, PASP nl, moderate sized circumferential pericardial effusion was identified, 2.12 cm around the LV free wall, <1 cm around the RV free wall. Features were not c/w tamponade physiology   PTSD (post-traumatic stress disorder)    Witnessed brother's suicide.   Restless leg syndrome    Seizures (HCC)    Syncope     Past Surgical History:  Procedure Laterality Date   APPENDECTOMY     CYSTOSCOPY WITH URETEROSCOPY AND STENT PLACEMENT     ESOPHAGOGASTRODUODENOSCOPY N/A 01/11/2016   Procedure: ESOPHAGOGASTRODUODENOSCOPY (EGD);  Surgeon: Charlott Rakes, MD;  Location: [facility] ENDOSCOPY;  Service: Endoscopy;  Laterality: N/A;   ESOPHAGOGASTRODUODENOSCOPY N/A 04/09/2020   Procedure: ESOPHAGOGASTRODUODENOSCOPY (EGD);  Surgeon: [name], MD;  Location: [facility] ENDOSCOPY;  Service: Gastroenterology;  Laterality: N/A;   INCISION AND DRAINAGE ABSCESS Left 01/02/2018   Procedure: INCISION AND DRAINAGE LEFT KNEE;  Surgeon: Deeann Saint, MD;  Location: [facility] ORS;  Service: Orthopedics;  Laterality: Left;   JOINT REPLACEMENT Right    TKR   KNEE ARTHROSCOPY Right 06/25/2014   Procedure: ARTHROSCOPY KNEE;  Surgeon: Deeann Saint, MD;  Location: [facility] ORS;  Service: Orthopedics;  Laterality: Right;  partial arthroscopic medial menisectomy   LAPAROSCOPIC APPENDECTOMY N/A 06/02/2021   Procedure: APPENDECTOMY LAPAROSCOPIC;  Surgeon: Campbell Lerner, MD;  Location: [facility] ORS;  Service: General;  Laterality: N/A;   TOTAL KNEE ARTHROPLASTY Right 04/22/2015   Procedure: TOTAL KNEE ARTHROPLASTY;  Surgeon: Deeann Saint, MD;  Location: [facility] ORS;  Service: Orthopedics;  Laterality: Right;   TOTAL KNEE ARTHROPLASTY  Left 10/30/2017   Procedure: TOTAL KNEE ARTHROPLASTY;  Surgeon: Deeann Saint, MD;  Location: [facility] ORS;  Service: Orthopedics;  Laterality: Left;   TOTAL KNEE REVISION Left 01/02/2018   Procedure: poly exchange of tibia and patella left knee;  Surgeon: Deeann Saint, MD;  Location: [facility] ORS;  Service: Orthopedics;  Laterality: Left;   UMBILICAL HERNIA REPAIR  06/02/2021   Procedure: HERNIA REPAIR UMBILICAL ADULT;  Surgeon: Campbell Lerner, MD;  Location: [facility] ORS;  Service: General;;    Current Medications: Current Facility-Administered Medications  Medication Dose Route Frequency Provider Last Rate Last Admin   acetaminophen (TYLENOL) tablet 650 mg  650 mg Oral Q6H PRN Lauree Chandler, NP   650 mg at  02/17/23 2127   Or   acetaminophen (TYLENOL) suppository 650 mg  650 mg Rectal Q6H PRN Lauree Chandler, NP       divalproex (DEPAKOTE) DR tablet 500 mg  500 mg Oral BID Lewanda Rife, MD   500 mg at 02/25/23 0754   doxepin (SINEQUAN) capsule 25 mg  25 mg Oral QHS [name], MD   25 mg at 02/24/23 2206   feeding supplement (ENSURE ENLIVE / ENSURE PLUS) liquid 237 mL  237 mL Oral TID BM Lewanda Rife, MD   237 mL at 02/25/23 0851   hydrOXYzine (ATARAX) tablet 25 mg  25 mg Oral Q6H PRN [name], MD   25 mg at 02/25/23 0755   levOCARNitine (CARNITOR) 1 GM/10ML solution 500 mg  500 mg Oral Daily Lauree Chandler, NP   500 mg at 02/21/23 1048   lurasidone (LATUDA) tablet 80 mg  80 mg Oral Q supper Lewanda Rife, MD   80 mg at 02/24/23 1731   melatonin tablet 5 mg  5 mg Oral QHS Lewanda Rife, MD   5 mg at 02/21/23 2126   metoprolol succinate (TOPROL-XL) 24 hr tablet 50 mg  50 mg Oral Daily Lewanda Rife, MD   50 mg at 02/25/23 0802   multivitamin with minerals tablet 1 tablet  1 tablet Oral Daily Lewanda Rife, MD   1 tablet at 02/25/23 0754   OLANZapine (ZYPREXA) injection 5 mg  5 mg Intramuscular TID PRN Lauree Chandler, NP       OLANZapine  zydis (ZYPREXA) disintegrating tablet 5 mg  5 mg Oral TID PRN Lauree Chandler, NP       tamsulosin [facility]) capsule 0.4 mg  0.4 mg Oral QPC supper Lewanda Rife, MD   0.4 mg at 02/24/23 1731   temazepam (RESTORIL) capsule 15 mg  15 mg Oral QHS Lewanda Rife, MD   15 mg at 02/24/23 2206   traMADol (ULTRAM) tablet 50 mg  50 mg Oral Q6H PRN Lewanda Rife, MD   50 mg at 02/25/23 0755   venlafaxine XR (EFFEXOR-XR) 24 hr capsule 150 mg  150 mg Oral QPC breakfast [name], MD   150 mg at 02/25/23 4098    Lab Results:  Results for orders placed or performed during the hospital encounter of 02/15/23 (from the past 48 hours)  Valproic acid level     Status: Abnormal   Collection Time: 02/25/23  9:38 AM  Result Value Ref Range   Valproic Acid Lvl 31 (L) 50.0 - 100.0 ug/mL    Comment: Performed at [facility], [address] [place], [place] [address]     Blood Alcohol level:  Lab Results  Component Value Date   [facility] <10 02/12/2023   ETH <10 12/18/2022    Metabolic Disorder Labs: Lab Results  Component Value Date   HGBA1C 5.5 12/29/2022   MPG 111.15 12/29/2022   MPG 111 04/16/2022   No results found for: "PROLACTIN" Lab Results  Component Value Date   CHOL 208 (H) 12/29/2022   TRIG 145 12/29/2022   HDL 33 (L) 12/29/2022   CHOLHDL 6.3 12/29/2022   VLDL 29 12/29/2022   LDLCALC 146 (H) 12/29/2022   LDLCALC 125 (H) 04/16/2022    Physical Findings: AIMS:  , ,  ,  ,    CIWA:    COWS:      Psychiatric Specialty Exam:  Presentation  General Appearance:  Appropriate for Environment; Casual  Eye Contact: Poor  Speech: Clear  and Coherent  Speech Volume: Decreased    Mood and Affect  Mood: Hopeless; Dysphoric  Affect: Flat; Depressed   Thought Process  Thought Processes: Coherent  Descriptions of Associations:Intact  Orientation:Full (Time, Place and Person)  Thought Content:Illogical  Hallucinations:Hallucinations:  Auditory  Ideas of Reference:None  Suicidal Thoughts:Suicidal Thoughts: Yes, Passive SI Passive Intent and/or Plan: Without Intent; Without Plan  Homicidal Thoughts:Homicidal Thoughts: No   Sensorium  Memory: Immediate Fair; Recent Fair; Remote Fair  Judgment: Impaired  Insight: Shallow   Executive Functions  Concentration: Fair  Attention Span: Fair  Recall: Fair  Fund of Knowledge: Fair  Language: Fair   Psychomotor Activity  Psychomotor Activity: Psychomotor Activity: Normal  Musculoskeletal: Strength & Muscle Tone: within normal limits Gait & Station: normal Assets  Assets: Manufacturing systems engineer; Desire for Improvement; Physical Health    Physical Exam: Physical Exam Vitals and nursing note reviewed.  HENT:     Head: Normocephalic.     Nose: Nose normal.     Mouth/Throat:     Mouth: Mucous membranes are moist.  Eyes:     Pupils: Pupils are equal, round, and reactive to light.  Cardiovascular:     Rate and Rhythm: Normal rate.  Pulmonary:     Breath sounds: Normal breath sounds.  Abdominal:     General: Bowel sounds are normal.  Skin:    General: Skin is warm.  Neurological:     General: No focal deficit present.     Mental Status: He is alert.    Review of Systems  Constitutional: Negative.   HENT: Negative.    Eyes: Negative.   Respiratory: Negative.    Cardiovascular: Negative.   Gastrointestinal: Negative.   Skin: Negative.   Neurological: Negative.    Blood pressure 116/78, pulse 80, temperature 98.1 F (36.7 C), resp. rate 18, SpO2 100%. There is no height or weight on file to calculate BMI.  Diagnosis: Principal Problem:   Bipolar disorder, current episode depressed, severe, with psychotic features (HCC) Active Problems:   Cocaine use   Rhabdomyolysis   Seizures (HCC)   PLAN: Safety and Monitoring:  -- Voluntary admission to inpatient psychiatric unit for safety, stabilization and treatment  -- Daily contact  with patient to assess and evaluate symptoms and progress in treatment  -- Patient's case to be discussed in multi-disciplinary team meeting  -- Observation Level : q15 minute checks  -- Vital signs:  q12 hours  -- Precautions: suicide, elopement, and assault -- Encouraged patient to participate in unit milieu and in scheduled group therapies  2. Psychiatric Diagnoses and Treatment:    Depakote 500 mg by mouth twice daily for mood stabilization and seizure d/o- will check Depakote levels -Continue Latuda to 80 mg daily.  Dose increased 02/18/2023 -Melatonin 5 mg at bedtime for insomnia -Increase Effexor XR 150 mg to help with depression  - Vistaril was discontinued as not effective and added doxepin 25 mg at bedtime for insomnia on 02/24/23; will be titrated up to 50mg  QHS     3. Medical Issues Being Addressed:  Metoprolol for SVT and HTN   Ck down to 226, WNL. Patient encouraged to drink water and keep himself hydrated  4. Discharge Planning:   -- Social work and case management to assist with discharge planning and identification of hospital follow-up needs prior to discharge  -- Estimated LOS: 3-4 days  [name], MD 02/25/2023, 10:52 AM

## 2023-02-26 NOTE — Plan of Care (Signed)
  Problem: Health Behavior/Discharge Planning: Goal: Ability to make decisions will improve Outcome: Not Progressing Goal: Compliance with therapeutic regimen will improve Outcome: Not Progressing

## 2023-02-26 NOTE — Progress Notes (Signed)
[facility] MD Progress Note  02/26/2023 1:29 PM [name]  MRN:  [id number] [name] is a 63 y.o. male who was initially medically admitted after presenting to [facility] on 02/12/23 for suicidal thoughts, seizures. Was noted to have elevated CK and admitted to hospitalist service for management of rhabdomyolysis. Patient is transferred to geropsych unit after he is medically cleared.   Subjective:  Chart reviewed, case discussed in multidisciplinary meeting, patient seen during rounds.  Today patient is noted to be resting in bed.  He reports that he got few hours of good sleep last night with increased dose of doxepin.  He continues to endorse suicidal ideation with no specific plan.  He continues to endorse command type auditory hallucinations telling him to kill himself but he did states that the voices are slowing down.  He denies visual hallucinations.  He denies HI/intent/plan.  He reports feeling hopeless and reports anhedonia.  Per nursing staff patient is attending the groups, no behavioral problems on the unit. Sleep: Fair  Appetite:  Fair  Past Psychiatric History: see h&P Family History:  Family History  Problem Relation Age of Onset   CVA Mother        deceased at age 47   Depression Brother        Died by suicide at age 5   Social History:  Social History   Substance and Sexual Activity  Alcohol Use Not Currently   Comment: rare     Social History   Substance and Sexual Activity  Drug Use Yes   Types: Marijuana, Cocaine   Comment: last use January 2025, cocaine    Social History   Socioeconomic History   Marital status: Single    Spouse name: Not on file   Number of children: Not on file   Years of education: Not on file   Highest education level: Not on file  Occupational History   Not on file  Tobacco Use   Smoking status: Former    Current packs/day: 0.00    Average packs/day: 0.8 packs/day for 20.0 years (15.0 ttl pk-yrs)    Types: Cigarettes     Start date: 05/16/1964    Quit date: 05/16/1984    Years since quitting: 38.8   Smokeless tobacco: Never  Vaping Use   Vaping status: Never Used  Substance and Sexual Activity   Alcohol use: Not Currently    Comment: rare   Drug use: Yes    Types: Marijuana, Cocaine    Comment: last use January 2025, cocaine   Sexual activity: Not Currently  Other Topics Concern   Not on file  Social History Narrative   ** Merged History Encounter **       Social Drivers of Health   Financial Resource Strain: Not on file  Food Insecurity: No Food Insecurity (02/15/2023)   Hunger Vital Sign    Worried About Running Out of Food in the Last Year: Never true    Ran Out of Food in the Last Year: Never true  Recent Concern: Food Insecurity - Food Insecurity Present (12/20/2022)   Hunger Vital Sign    Worried About Running Out of Food in the Last Year: Sometimes true    Ran Out of Food in the Last Year: Sometimes true  Transportation Needs: No Transportation Needs (02/15/2023)   PRAPARE - Administrator, Civil Service (Medical): No    Lack of Transportation (Non-Medical): No  Recent Concern: Transportation Needs - Unmet Transportation Needs (  02/14/2023)   PRAPARE - Administrator, Civil Service (Medical): Yes    Lack of Transportation (Non-Medical): Yes  Physical Activity: Not on file  Stress: Not on file  Social Connections: Socially Isolated (02/15/2023)   Social Connection and Isolation Panel [NHANES]    Frequency of Communication with Friends and Family: Once a week    Frequency of Social Gatherings with Friends and Family: Once a week    Attends Religious Services: Never    Database administrator or Organizations: No    Attends Engineer, structural: Never    Marital Status: Never married   Past Medical History:  Past Medical History:  Diagnosis Date   Anxiety    Arthritis    knees and hands   Bipolar 1 disorder, depressed (HCC)    Bipolar disorder (HCC)     Depression    GERD (gastroesophageal reflux disease)    Hepatitis    HEP "C"   History of kidney stones    Hypertension    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 echo 5/17: EF 60-65%, no RWMA, LV dias fxn nl, LA mildly dilated, RV sys fxn nl, PASP nl, moderate sized circumferential pericardial effusion was identified, 2.12 cm around the LV free wall, <1 cm around the RV free wall. Features were not c/w tamponade physiology   PTSD (post-traumatic stress disorder)    Witnessed brother's suicide.   Restless leg syndrome    Seizures (HCC)    Syncope     Past Surgical History:  Procedure Laterality Date   APPENDECTOMY     CYSTOSCOPY WITH URETEROSCOPY AND STENT PLACEMENT     ESOPHAGOGASTRODUODENOSCOPY N/A 01/11/2016   Procedure: ESOPHAGOGASTRODUODENOSCOPY (EGD);  Surgeon: Charlott Rakes, MD;  Location: [facility] ENDOSCOPY;  Service: Endoscopy;  Laterality: N/A;   ESOPHAGOGASTRODUODENOSCOPY N/A 04/09/2020   Procedure: ESOPHAGOGASTRODUODENOSCOPY (EGD);  Surgeon: [name], MD;  Location: [facility] ENDOSCOPY;  Service: Gastroenterology;  Laterality: N/A;   INCISION AND DRAINAGE ABSCESS Left 01/02/2018   Procedure: INCISION AND DRAINAGE LEFT KNEE;  Surgeon: Deeann Saint, MD;  Location: [facility] ORS;  Service: Orthopedics;  Laterality: Left;   JOINT REPLACEMENT Right    TKR   KNEE ARTHROSCOPY Right 06/25/2014   Procedure: ARTHROSCOPY KNEE;  Surgeon: Deeann Saint, MD;  Location: [facility] ORS;  Service: Orthopedics;  Laterality: Right;  partial arthroscopic medial menisectomy   LAPAROSCOPIC APPENDECTOMY N/A 06/02/2021   Procedure: APPENDECTOMY LAPAROSCOPIC;  Surgeon: Campbell Lerner, MD;  Location: [facility] ORS;  Service: General;  Laterality: N/A;   TOTAL KNEE ARTHROPLASTY Right 04/22/2015   Procedure: TOTAL KNEE ARTHROPLASTY;  Surgeon: Deeann Saint, MD;  Location: [facility] ORS;  Service: Orthopedics;  Laterality: Right;   TOTAL KNEE ARTHROPLASTY  Left 10/30/2017   Procedure: TOTAL KNEE ARTHROPLASTY;  Surgeon: Deeann Saint, MD;  Location: [facility] ORS;  Service: Orthopedics;  Laterality: Left;   TOTAL KNEE REVISION Left 01/02/2018   Procedure: poly exchange of tibia and patella left knee;  Surgeon: Deeann Saint, MD;  Location: [facility] ORS;  Service: Orthopedics;  Laterality: Left;   UMBILICAL HERNIA REPAIR  06/02/2021   Procedure: HERNIA REPAIR UMBILICAL ADULT;  Surgeon: Campbell Lerner, MD;  Location: [facility] ORS;  Service: General;;    Current Medications: Current Facility-Administered Medications  Medication Dose Route Frequency Provider Last Rate Last Admin   acetaminophen (TYLENOL) tablet 650 mg  650 mg Oral Q6H PRN Lauree Chandler, NP   650 mg at  02/17/23 2127   Or   acetaminophen (TYLENOL) suppository 650 mg  650 mg Rectal Q6H PRN Lauree Chandler, NP       divalproex (DEPAKOTE) DR tablet 500 mg  500 mg Oral BID Lewanda Rife, MD   500 mg at 02/26/23 0907   doxepin (SINEQUAN) capsule 50 mg  50 mg Oral QHS [name], MD   50 mg at 02/25/23 2106   feeding supplement (ENSURE ENLIVE / ENSURE PLUS) liquid 237 mL  237 mL Oral TID BM Lewanda Rife, MD   237 mL at 02/25/23 1415   hydrOXYzine (ATARAX) tablet 25 mg  25 mg Oral Q6H PRN [name], MD   25 mg at 02/25/23 1643   levOCARNitine (CARNITOR) 1 GM/10ML solution 500 mg  500 mg Oral Daily Lauree Chandler, NP   500 mg at 02/21/23 1048   lurasidone (LATUDA) tablet 80 mg  80 mg Oral Q supper Lewanda Rife, MD   80 mg at 02/25/23 1639   melatonin tablet 5 mg  5 mg Oral QHS Lewanda Rife, MD   5 mg at 02/21/23 2126   metoprolol succinate (TOPROL-XL) 24 hr tablet 50 mg  50 mg Oral Daily Lewanda Rife, MD   50 mg at 02/26/23 0981   multivitamin with minerals tablet 1 tablet  1 tablet Oral Daily Lewanda Rife, MD   1 tablet at 02/26/23 0907   OLANZapine (ZYPREXA) injection 5 mg  5 mg Intramuscular TID PRN Lauree Chandler, NP       OLANZapine  zydis (ZYPREXA) disintegrating tablet 5 mg  5 mg Oral TID PRN Lauree Chandler, NP       tamsulosin [facility]) capsule 0.4 mg  0.4 mg Oral QPC supper Lewanda Rife, MD   0.4 mg at 02/25/23 1639   temazepam (RESTORIL) capsule 15 mg  15 mg Oral QHS Lewanda Rife, MD   15 mg at 02/25/23 2106   traMADol (ULTRAM) tablet 50 mg  50 mg Oral Q6H PRN Lewanda Rife, MD   50 mg at 02/26/23 0907   venlafaxine XR (EFFEXOR-XR) 24 hr capsule 150 mg  150 mg Oral QPC breakfast [name], MD   150 mg at 02/26/23 1914    Lab Results:  Results for orders placed or performed during the hospital encounter of 02/15/23 (from the past 48 hours)  Valproic acid level     Status: Abnormal   Collection Time: 02/25/23  9:38 AM  Result Value Ref Range   Valproic Acid Lvl 31 (L) 50.0 - 100.0 ug/mL    Comment: Performed at [facility], [address] [place], [place] [address]     Blood Alcohol level:  Lab Results  Component Value Date   [facility] <10 02/12/2023   ETH <10 12/18/2022    Metabolic Disorder Labs: Lab Results  Component Value Date   HGBA1C 5.5 12/29/2022   MPG 111.15 12/29/2022   MPG 111 04/16/2022   No results found for: "PROLACTIN" Lab Results  Component Value Date   CHOL 208 (H) 12/29/2022   TRIG 145 12/29/2022   HDL 33 (L) 12/29/2022   CHOLHDL 6.3 12/29/2022   VLDL 29 12/29/2022   LDLCALC 146 (H) 12/29/2022   LDLCALC 125 (H) 04/16/2022    Physical Findings: AIMS:  , ,  ,  ,    CIWA:    COWS:      Psychiatric Specialty Exam:  Presentation  General Appearance:  Appropriate for Environment; Casual  Eye Contact: Fleeting  Speech: Clear  and Coherent  Speech Volume: Normal    Mood and Affect  Mood: Depressed; Hopeless; Irritable  Affect: Flat; Depressed   Thought Process  Thought Processes: Coherent  Descriptions of Associations:Intact  Orientation:Full (Time, Place and Person)  Thought Content:Illogical;  Perseveration  Hallucinations:Hallucinations: Auditory Description of Command Hallucinations: command hallucinations to kill self  Ideas of Reference:None  Suicidal Thoughts:Suicidal Thoughts: Yes, Passive SI Passive Intent and/or Plan: Without Intent; Without Plan  Homicidal Thoughts:Homicidal Thoughts: No   Sensorium  Memory: Recent Fair; Immediate Fair; Remote Fair  Judgment: Impaired  Insight: Shallow   Executive Functions  Concentration: Fair  Attention Span: Fair  Recall: [employer] of Knowledge: Fair  Language: Fair   Psychomotor Activity  Psychomotor Activity: Psychomotor Activity: Normal  Musculoskeletal: Strength & Muscle Tone: within normal limits Gait & Station: normal Assets  Assets: Manufacturing systems engineer; Desire for Improvement; Physical Health    Physical Exam: Physical Exam Vitals and nursing note reviewed.  HENT:     Head: Normocephalic.     Nose: Nose normal.     Mouth/Throat:     Mouth: Mucous membranes are moist.  Eyes:     Pupils: Pupils are equal, round, and reactive to light.  Cardiovascular:     Rate and Rhythm: Normal rate.  Pulmonary:     Breath sounds: Normal breath sounds.  Abdominal:     General: Bowel sounds are normal.  Skin:    General: Skin is warm.  Neurological:     General: No focal deficit present.     Mental Status: He is alert.    Review of Systems  Constitutional: Negative.   HENT: Negative.    Eyes: Negative.   Respiratory: Negative.    Cardiovascular: Negative.   Gastrointestinal: Negative.   Skin: Negative.   Neurological: Negative.    Blood pressure 108/83, pulse 71, temperature 97.9 F (36.6 C), resp. rate 18, SpO2 100%. There is no height or weight on file to calculate BMI.  Diagnosis: Principal Problem:   Bipolar disorder, current episode depressed, severe, with psychotic features (HCC) Active Problems:   Cocaine use   Rhabdomyolysis   Seizures (HCC)   PLAN: Safety and  Monitoring:  -- Voluntary admission to inpatient psychiatric unit for safety, stabilization and treatment  -- Daily contact with patient to assess and evaluate symptoms and progress in treatment  -- Patient's case to be discussed in multi-disciplinary team meeting  -- Observation Level : q15 minute checks  -- Vital signs:  q12 hours  -- Precautions: suicide, elopement, and assault -- Encouraged patient to participate in unit milieu and in scheduled group therapies  2. Psychiatric Diagnoses and Treatment:    Depakote 500 mg by mouth twice daily for mood stabilization and seizure d/o- will check Depakote levels -Continue Latuda to 80 mg daily.  Dose increased 02/18/2023 -Melatonin 5 mg at bedtime for insomnia - Effexor XR 150 mg to help with depression  - Vistaril was discontinued as not effective and added doxepin 25 mg at bedtime for insomnia on 02/24/23;  titrated up to 50mg  at bedtime on 02/25/23     3. Medical Issues Being Addressed:  Metoprolol for SVT and HTN   Ck down to 226, WNL. Patient encouraged to drink water and keep himself hydrated  4. Discharge Planning:   -- Social work and case management to assist with discharge planning and identification of hospital follow-up needs prior to discharge  -- Estimated LOS: 3-4 days  [name], MD 02/26/2023, 1:29 PM

## 2023-02-26 NOTE — Plan of Care (Signed)
Patient alert and oriented. Passive SI and AH; contracts verbally safety while on unit. Denies HI & VH. Pain. Scheduled medications administered to patient, per MD orders. Support and encouragement provided.  Routine safety checks conducted every 15 minutes.  Patient informed to notify staff with problems or concerns. Patient contracts for safety at this time. Patient compliant with medications and treatment plan. Patient receptive, calm, and cooperative. Patient is encouraged each shift to interact with others and attend group while on the unit.  Care, comfort and safety maintained/ongoing.  Problem: Education: Goal: Utilization of techniques to improve thought processes will improve Outcome: Progressing Goal: Knowledge of the prescribed therapeutic regimen will improve Outcome: Progressing   Problem: Activity: Goal: Interest or engagement in leisure activities will improve Outcome: Progressing Goal: Imbalance in normal sleep/wake cycle will improve Outcome: Progressing   Problem: Coping: Goal: Coping ability will improve Outcome: Progressing Goal: Will verbalize feelings Outcome: Progressing   Problem: Health Behavior/Discharge Planning: Goal: Ability to make decisions will improve Outcome: Progressing Goal: Compliance with therapeutic regimen will improve Outcome: Progressing   Problem: Role Relationship: Goal: Will demonstrate positive changes in social behaviors and relationships Outcome: Progressing   Problem: Safety: Goal: Ability to disclose and discuss suicidal ideas will improve Outcome: Progressing Goal: Ability to identify and utilize support systems that promote safety will improve Outcome: Progressing   Problem: Self-Concept: Goal: Will verbalize positive feelings about self Outcome: Progressing Goal: Level of anxiety will decrease Outcome: Progressing   Problem: Education: Goal: Ability to make informed decisions regarding treatment will improve Outcome:  Progressing   Problem: Coping: Goal: Coping ability will improve Outcome: Progressing   Problem: Health Behavior/Discharge Planning: Goal: Identification of resources available to assist in meeting health care needs will improve Outcome: Progressing   Problem: Medication: Goal: Compliance with prescribed medication regimen will improve Outcome: Progressing   Problem: Self-Concept: Goal: Ability to disclose and discuss suicidal ideas will improve Outcome: Progressing Goal: Will verbalize positive feelings about self Outcome: Progressing

## 2023-02-26 NOTE — Progress Notes (Signed)
   02/26/23 0600  15 Minute Checks  Location Bedroom  Visual Appearance Calm  Behavior Sleeping  Sleep (Behavioral Health Patients Only)  Calculate sleep? (Click Yes once per 24 hr at 0600 safety check) Yes  Documented sleep last 24 hours 15.5

## 2023-02-26 NOTE — Progress Notes (Signed)
Patient admitted to [name] on Feb 15, 2023 for suicidal ideation, worsening depression, and AH of his brother telling him to kill himself.  Today patient continues to endorses passive suicidal ideation and AH of the same. Engaged in active listening. Patient encouraged to be active in the milieu and socialize with peers. PRN's of hydroxyzine and tramadol x 2. Upon follow up, all 3 adm were met with relief.  Q15 minute unit checks in place.

## 2023-02-26 NOTE — Progress Notes (Signed)
   02/26/23 2000  Psych Admission Type (Psych Patients Only)  Admission Status Voluntary  Psychosocial Assessment  Patient Complaints Self-harm thoughts;Anxiety;Depression  Eye Contact Brief  Facial Expression Flat  Affect Flat  Speech Logical/coherent  Interaction Minimal  Motor Activity Slow  Appearance/Hygiene Unremarkable  Behavior Characteristics Anxious  Mood Depressed;Anxious;Sad  Thought Process  Coherency WDL  Content WDL  Delusions None reported or observed  Perception Hallucinations  Hallucination Auditory;Command (voices more quiet today but still telling pt to kill himself)  Judgment Impaired  Confusion None  Danger to Self  Current suicidal ideation? Passive  Description of Suicide Plan no plan  Self-Injurious Behavior Some self-injurious ideation observed or expressed.  No lethal plan expressed   Agreement Not to Harm Self Yes  Description of Agreement verbal  Danger to Others  Danger to Others None reported or observed   Progress note   D: Pt seen in dayroom. Pt denies HI, VH. Endorses passive SI without a plan. Endorses auditory hallucinations command in nature that tell him to kill himself. "The voices are less today but still there." Pt is guarded. Pt rates pain  8/10 in his neck, stating that the pain started after he fell during a seizure on 02/12/23. Pt rates anxiety 8/10 and depression 8/10. Pt did come out for group but then went straight back to his room. No other concerns noted at this time.  A: Pt provided support and encouragement. Pt given scheduled medication as prescribed. Pt refused melatonin. PRNs as appropriate. Q15 min checks for safety.   R: Pt safe on the unit. Will continue to monitor.

## 2023-02-26 NOTE — Progress Notes (Signed)
   02/25/23 2106  Psych Admission Type (Psych Patients Only)  Admission Status Voluntary  Psychosocial Assessment  Patient Complaints Anxiety;Depression  Eye Contact Brief  Facial Expression Flat  Affect Depressed;Flat  Speech Logical/coherent  Interaction Minimal;Other (Comment) (Group attendance encourged)  Motor Activity Slow  Appearance/Hygiene Unremarkable  Behavior Characteristics Cooperative  Mood Depressed  Aggressive Behavior  Effect No apparent injury  Thought Process  Coherency WDL  Content WDL  Delusions None reported or observed  Perception Hallucinations  Hallucination None reported or observed  Judgment Impaired  Confusion None  Danger to Self  Current suicidal ideation? Passive  Self-Injurious Behavior No self-injurious ideation or behavior indicators observed or expressed   Agreement Not to Harm Self Yes  Description of Agreement Verbal  Danger to Others  Danger to Others None reported or observed

## 2023-02-27 NOTE — Progress Notes (Signed)
   02/27/23 0600  15 Minute Checks  Location Bedroom  Visual Appearance Calm  Behavior Sleeping  Sleep (Behavioral Health Patients Only)  Calculate sleep? (Click Yes once per 24 hr at 0600 safety check) Yes  Documented sleep last 24 hours 11

## 2023-02-27 NOTE — Group Note (Deleted)
Date:  02/27/2023 Time:  9:37 PM  Group Topic/Focus:  Wrap-Up Group:   The focus of this group is to help patients review their daily goal of treatment and discuss progress on daily workbooks.     Participation Level:  {BHH PARTICIPATION ZOXWR:60454}  Participation Quality:  {BHH PARTICIPATION QUALITY:22265}  Affect:  {BHH AFFECT:22266}  Cognitive:  {BHH COGNITIVE:22267}  Insight: {BHH Insight2:20797}  Engagement in Group:  {BHH ENGAGEMENT IN UJWJX:91478}  Modes of Intervention:  {BHH MODES OF INTERVENTION:22269}  Additional Comments:  ***  Christ Kick 02/27/2023, 9:37 PM

## 2023-02-27 NOTE — Group Note (Signed)
Recreation Therapy Group Note   Group Topic:Health and Wellness  Group Date: 02/27/2023 Start Time: 1100 End Time: 1140 Facilitators: Rosina Lowenstein, LRT, CTRS Location: Dayroom  Group Description: Seated Exercise. LRT discussed the mental and physical benefits of exercise. LRT and group discussed how physical activity can be used as a coping skill. Pt's and LRT followed along to an exercise video on the TV screen that provided a visual representation and audio description of every exercise performed. Pt's encouraged to listen to their bodies and stop at any time if they experience feelings of discomfort or pain. Pts were encouraged to drink water and stay hydrated.   Goal Area(s) Addressed: Patient will learn benefits of physical activity. Patient will identify exercise as a coping skill.  Patient will follow multistep directions. Patient will try a new leisure interest.   Affect/Mood: Appropriate and Flat   Participation Level: Active and Engaged   Participation Quality: Independent   Behavior: Calm and Cooperative   Speech/Thought Process: Coherent   Insight: Good   Judgement: Good   Modes of Intervention: Activity   Patient Response to Interventions:  Attentive and Receptive   Education Outcome:  Acknowledges education   Clinical Observations/Individualized Feedback: [name] was active in their participation of session activities and group discussion. Pt completed all exercises as shown. Pt minimally interacted with LRT and peers while in group.    Plan: Continue to engage patient in RT group sessions 2-3x/week.   Rosina Lowenstein, LRT, CTRS 02/27/2023 2:07 PM

## 2023-02-27 NOTE — Group Note (Signed)
Recreation Therapy Group Note   Group Topic:Healthy Support Systems  Group Date: 02/27/2023 Start Time: 1500 End Time: 1550 Facilitators: Rosina Lowenstein, LRT, CTRS Location:  Dayroom  Group Description: Straw Bridge. In groups or individually, patients were given 10 plastic drinking straws and an equal length of masking tape. Using the materials provided, patients were instructed to build a free-standing bridge-like structure to suspend an everyday item (ex: deck of cards) off the floor or table surface. All materials were required to be used in Secondary school teacher. LRT facilitated post-activity discussion reviewing the importance of having strong and healthy support systems in our lives. LRT discussed how the people in our lives serve as the tape and the deck of cards we placed on top of our straw structure are the stressors we face in daily life. LRT and pts discussed what happens in our life when things get too heavy for [place], and we don't have strong supports outside of the hospital. Pt shared 2 of their healthy supports in their life aloud in the group.   Goal Area(s) Addressed:  Patient will identify 2 healthy supports in their life. Patient will identify skills to successfully complete activity. Patient will identify correlation of this activity to life post-discharge.  Patient will build on frustration tolerance skills. Patient will increase team building and communication skills.   Affect/Mood: Appropriate and Flat   Participation Level: Active and Engaged   Participation Quality: Independent   Behavior: Calm and Cooperative   Speech/Thought Process: Coherent   Insight: Good   Judgement: Good   Modes of Intervention: Exploration, Guided Discussion, Reality Testing, and STEM Activity   Patient Response to Interventions:  Attentive, Engaged, Interested , and Receptive   Education Outcome:  Acknowledges education   Clinical Observations/Individualized Feedback: Jaishaun was active  in their participation of session activities and group discussion. Pt identified "medication and family" as healthy supports. Pt successfully completed a straw structure with all given materials. Pt interacted well with LRT and peers duration of session.    Plan: Continue to engage patient in RT group sessions 2-3x/week.   Rosina Lowenstein, LRT, CTRS 02/27/2023 4:43 PM

## 2023-02-27 NOTE — BH IP Treatment Plan (Signed)
Interdisciplinary Treatment and Diagnostic Plan Update  02/27/2023 Time of Session: 10:30 AM  [name] MRN: [id number]  Principal Diagnosis: Bipolar disorder, current episode depressed, severe, with psychotic features (HCC)  Secondary Diagnoses: Principal Problem:   Bipolar disorder, current episode depressed, severe, with psychotic features (HCC) Active Problems:   Cocaine use   Rhabdomyolysis   Seizures (HCC)   Current Medications:  Current Facility-Administered Medications  Medication Dose Route Frequency Provider Last Rate Last Admin   acetaminophen (TYLENOL) tablet 650 mg  650 mg Oral Q6H PRN Lauree Chandler, NP   650 mg at 02/26/23 2124   Or   acetaminophen (TYLENOL) suppository 650 mg  650 mg Rectal Q6H PRN Lauree Chandler, NP       divalproex (DEPAKOTE) DR tablet 500 mg  500 mg Oral BID Lewanda Rife, MD   500 mg at 02/27/23 0925   doxepin (SINEQUAN) capsule 50 mg  50 mg Oral QHS [name], MD   50 mg at 02/26/23 2125   feeding supplement (ENSURE ENLIVE / ENSURE PLUS) liquid 237 mL  237 mL Oral TID BM Lewanda Rife, MD   237 mL at 02/27/23 1217   hydrOXYzine (ATARAX) tablet 25 mg  25 mg Oral Q6H PRN [name], MD   25 mg at 02/27/23 1224   levOCARNitine (CARNITOR) 1 GM/10ML solution 500 mg  500 mg Oral Daily Lauree Chandler, NP   500 mg at 02/21/23 1048   lurasidone (LATUDA) tablet 80 mg  80 mg Oral Q supper Lewanda Rife, MD   80 mg at 02/26/23 1618   melatonin tablet 5 mg  5 mg Oral QHS Lewanda Rife, MD   5 mg at 02/21/23 2126   metoprolol succinate (TOPROL-XL) 24 hr tablet 50 mg  50 mg Oral Daily Lewanda Rife, MD   50 mg at 02/26/23 4098   multivitamin with minerals tablet 1 tablet  1 tablet Oral Daily Lewanda Rife, MD   1 tablet at 02/27/23 0925   OLANZapine (ZYPREXA) injection 5 mg  5 mg Intramuscular TID PRN Lauree Chandler, NP       OLANZapine zydis (ZYPREXA) disintegrating tablet 5 mg  5 mg Oral TID PRN  Lauree Chandler, NP       tamsulosin [facility]) capsule 0.4 mg  0.4 mg Oral QPC supper Lewanda Rife, MD   0.4 mg at 02/26/23 1631   temazepam (RESTORIL) capsule 15 mg  15 mg Oral QHS Lewanda Rife, MD   15 mg at 02/26/23 2125   traMADol (ULTRAM) tablet 50 mg  50 mg Oral Q6H PRN Lewanda Rife, MD   50 mg at 02/27/23 1191   venlafaxine XR (EFFEXOR-XR) 24 hr capsule 150 mg  150 mg Oral QPC breakfast [name], MD   150 mg at 02/27/23 4782   PTA Medications: Medications Prior to Admission  Medication Sig Dispense Refill Last Dose/Taking   divalproex (DEPAKOTE) 500 MG DR tablet Take 1 tablet (500 mg total) by mouth 2 (two) times daily. 60 tablet 0    levOCARNitine (CARNITOR) 1 GM/10ML solution Take 5 mLs (500 mg total) by mouth daily.      lurasidone (LATUDA) 40 MG TABS tablet Take 1 tablet (40 mg total) by mouth daily with supper. 30 tablet 0    metoprolol succinate (TOPROL-XL) 50 MG 24 hr tablet Take 1 tablet (50 mg total) by mouth daily. 30 tablet 0    tamsulosin (FLOMAX) 0.4 MG CAPS capsule Take 1 capsule (0.4 mg total) by mouth  daily after supper.      temazepam (RESTORIL) 15 MG capsule Take 1 capsule (15 mg total) by mouth at bedtime. 30 capsule 0    traMADol (ULTRAM) 50 MG tablet Take 50 mg by mouth every 6 (six) hours as needed.      venlafaxine XR (EFFEXOR-XR) 75 MG 24 hr capsule Take 1 capsule (75 mg total) by mouth daily after breakfast. 30 capsule 0     Patient Stressors:    Patient Strengths:    Treatment Modalities: Medication Management, Group therapy, Case management,  1 to 1 session with clinician, Psychoeducation, Recreational therapy.   Physician Treatment Plan for Primary Diagnosis: Bipolar disorder, current episode depressed, severe, with psychotic features (HCC) Long Term Goal(s): Improvement in symptoms so as ready for discharge   Short Term Goals: Ability to identify changes in lifestyle to reduce recurrence of condition will improve Ability to  verbalize feelings will improve Ability to disclose and discuss suicidal ideas Ability to demonstrate self-control will improve Ability to identify and develop effective coping behaviors will improve Ability to maintain clinical measurements within normal limits will improve Compliance with prescribed medications will improve Ability to identify triggers associated with substance abuse/mental health issues will improve  Medication Management: Evaluate patient's response, side effects, and tolerance of medication regimen.  Therapeutic Interventions: 1 to 1 sessions, Unit Group sessions and Medication administration.  Evaluation of Outcomes: Progressing  Physician Treatment Plan for Secondary Diagnosis: Principal Problem:   Bipolar disorder, current episode depressed, severe, with psychotic features (HCC) Active Problems:   Cocaine use   Rhabdomyolysis   Seizures (HCC)  Long Term Goal(s): Improvement in symptoms so as ready for discharge   Short Term Goals: Ability to identify changes in lifestyle to reduce recurrence of condition will improve Ability to verbalize feelings will improve Ability to disclose and discuss suicidal ideas Ability to demonstrate self-control will improve Ability to identify and develop effective coping behaviors will improve Ability to maintain clinical measurements within normal limits will improve Compliance with prescribed medications will improve Ability to identify triggers associated with substance abuse/mental health issues will improve     Medication Management: Evaluate patient's response, side effects, and tolerance of medication regimen.  Therapeutic Interventions: 1 to 1 sessions, Unit Group sessions and Medication administration.  Evaluation of Outcomes: Progressing   RN Treatment Plan for Primary Diagnosis: Bipolar disorder, current episode depressed, severe, with psychotic features (HCC) Long Term Goal(s): Knowledge of disease and  therapeutic regimen to maintain health will improve  Short Term Goals: Ability to remain free from injury will improve, Ability to verbalize frustration and anger appropriately will improve, Ability to demonstrate self-control, Ability to participate in decision making will improve, Ability to verbalize feelings will improve, Ability to disclose and discuss suicidal ideas, Ability to identify and develop effective coping behaviors will improve, and Compliance with prescribed medications will improve  Medication Management: RN will administer medications as ordered by provider, will assess and evaluate patient's response and provide education to patient for prescribed medication. RN will report any adverse and/or side effects to prescribing provider.  Therapeutic Interventions: 1 on 1 counseling sessions, Psychoeducation, Medication administration, Evaluate responses to treatment, Monitor vital signs and CBGs as ordered, Perform/monitor CIWA, COWS, AIMS and Fall Risk screenings as ordered, Perform wound care treatments as ordered.  Evaluation of Outcomes: Progressing   LCSW Treatment Plan for Primary Diagnosis: Bipolar disorder, current episode depressed, severe, with psychotic features (HCC) Long Term Goal(s): Safe transition to appropriate next level of care at  discharge, Engage patient in therapeutic group addressing interpersonal concerns.  Short Term Goals: Engage patient in aftercare planning with referrals and resources, Increase social support, Increase ability to appropriately verbalize feelings, Increase emotional regulation, Facilitate acceptance of mental health diagnosis and concerns, Facilitate patient progression through stages of change regarding substance use diagnoses and concerns, Identify triggers associated with mental health/substance abuse issues, and Increase skills for wellness and recovery  Therapeutic Interventions: Assess for all discharge needs, 1 to 1 time with Social  worker, Explore available resources and support systems, Assess for adequacy in community support network, Educate family and significant other(s) on suicide prevention, Complete Psychosocial Assessment, Interpersonal group therapy.  Evaluation of Outcomes: Progressing   Progress in Treatment: Attending groups: Yes. and No. Participating in groups: Yes. and No. Taking medication as prescribed: Yes. Toleration medication: Yes. Family/Significant other contact made: No, will contact:  CSW  will contact if given permission Patient understands diagnosis: Yes. Discussing patient identified problems/goals with staff: Yes. Medical problems stabilized or resolved: Yes. Denies suicidal/homicidal ideation: Yes. Issues/concerns per patient self-inventory: No. Other: None    New problem(s) identified: No, Describe:  None identified  Update 02/22/23: No changes at this time Update 02/27/23: No changes at this time    New Short Term/Long Term Goal(s):  elimination of symptoms of psychosis, medication management for mood stabilization; elimination of SI thoughts; development of comprehensive mental wellness plan. Update 02/22/23: No changes at this time Update 02/27/23: No changes at this time        Patient Goals:  "I want to feel better, I want to stop having these flashbacks, get this anxiety under control" Update 02/22/23: No changes at this time Update 02/27/23: No changes at this time        Discharge Plan or Barriers: CSW will assist with appropriate discharge planning Update 02/22/23: No changes at this time Update 02/27/23: No changes at this time        Reason for Continuation of Hospitalization: Anxiety Depression Medication stabilization Withdrawal symptoms   Estimated Length of Stay: 1 to 7 days Update 02/22/23: TBD Update 02/27/23: TBD    Last 3 [place] Suicide Severity Risk Score: Flowsheet Row Admission (Current) from 02/15/2023 in [facility] BE[facility] ED to  Hosp-Admission (Discharged) from 02/12/2023 in [facility] 1C MEDICAL TELEMETRY Admission (Discharged) from 12/23/2022 in [facility] INPATIENT BEH[facility]AL MEDICINE  C-SSRS RISK CATEGORY High Risk High Risk High Risk       Last PHQ 2/9 Scores:     No data to display          Scribe for Treatment Team: [name], Theresia Majors 02/27/2023 1:24 PM

## 2023-02-27 NOTE — Group Note (Signed)
Date:  02/27/2023 Time:  10:11 PM  Group Topic/Focus:  Wrap-Up Group:   The focus of this group is to help patients review their daily goal of treatment and discuss progress on daily workbooks.    Participation Level:  Did Not Attend   [name] 02/27/2023, 10:11 PM

## 2023-02-27 NOTE — Progress Notes (Signed)
   02/27/23 1940  Psych Admission Type (Psych Patients Only)  Admission Status Voluntary  Psychosocial Assessment  Patient Complaints Anxiety;Depression;Self-harm thoughts  Eye Contact Brief  Facial Expression Flat  Affect Depressed  Speech Logical/coherent  Interaction Minimal  Motor Activity Slow  Appearance/Hygiene Unremarkable  Behavior Characteristics Cooperative;Anxious  Mood Depressed  Thought Process  Coherency WDL  Content WDL  Delusions None reported or observed  Perception Hallucinations  Hallucination Auditory;Command (has lessened)  Judgment Impaired  Confusion None  Danger to Self  Current suicidal ideation? Passive  Description of Suicide Plan no plan  Self-Injurious Behavior Some self-injurious ideation observed or expressed.  No lethal plan expressed   Agreement Not to Harm Self Yes  Description of Agreement verbal  Danger to Others  Danger to Others None reported or observed   Progress note   D: Pt seen in his room. Pt denies HI, VH. Endorses passive SI without a specific plan. Contracts for safety. Endorses command AH of his brother's voice telling him to kill himself. "They are less today but still there." Pt rates pain  8/10 as acute neck pain from a fall after a seizure that is made worse by stressors in his life. Pt rates anxiety  8/10 and depression  8/10. Pt did not attend wrap up group but did attend groups during the day. Minimal. No other concerns noted at this time.  A: Pt provided support and encouragement. Pt given scheduled medication as prescribed. PRNs as appropriate. Q15 min checks for safety.   R: Pt safe on the unit. Will continue to monitor.

## 2023-02-27 NOTE — Progress Notes (Signed)
[facility] MD Progress Note  02/27/2023 4:27 PM [name]  MRN:  [id number] [name] is a 63 y.o. male who was initially medically admitted after presenting to [facility] on 02/12/23 for suicidal thoughts, seizures. Was noted to have elevated CK and admitted to hospitalist service for management of rhabdomyolysis. Patient is transferred to geropsych unit after he is medically cleared.   Subjective:  Chart reviewed, case discussed in multidisciplinary meeting, patient seen during rounds. 63 year-old male who has challenges with homelessness has been here multiple times. Patient is endorsing passive SI mainly housing issues, endorses perceptual disturbances, but does not appear to be responding to internal stimuli. He doesn't want to engage in meaningful interview. DENIES homicidal thoughts.   Plan to continue current treatment plan  Sleep: Fair  Appetite:  Fair  Past Psychiatric History: see h&P Family History:  Family History  Problem Relation Age of Onset   CVA Mother        deceased at age 98   Depression Brother        Died by suicide at age 34   Social History:  Social History   Substance and Sexual Activity  Alcohol Use Not Currently   Comment: rare     Social History   Substance and Sexual Activity  Drug Use Yes   Types: Marijuana, Cocaine   Comment: last use January 2025, cocaine    Social History   Socioeconomic History   Marital status: Single    Spouse name: Not on file   Number of children: Not on file   Years of education: Not on file   Highest education level: Not on file  Occupational History   Not on file  Tobacco Use   Smoking status: Former    Current packs/day: 0.00    Average packs/day: 0.8 packs/day for 20.0 years (15.0 ttl pk-yrs)    Types: Cigarettes    Start date: 05/16/1964    Quit date: 05/16/1984    Years since quitting: 38.8   Smokeless tobacco: Never  Vaping Use   Vaping status: Never Used  Substance and Sexual Activity   Alcohol  use: Not Currently    Comment: rare   Drug use: Yes    Types: Marijuana, Cocaine    Comment: last use January 2025, cocaine   Sexual activity: Not Currently  Other Topics Concern   Not on file  Social History Narrative   ** Merged History Encounter **       Social Drivers of Health   Financial Resource Strain: Not on file  Food Insecurity: No Food Insecurity (02/15/2023)   Hunger Vital Sign    Worried About Running Out of Food in the Last Year: Never true    Ran Out of Food in the Last Year: Never true  Recent Concern: Food Insecurity - Food Insecurity Present (12/20/2022)   Hunger Vital Sign    Worried About Running Out of Food in the Last Year: Sometimes true    Ran Out of Food in the Last Year: Sometimes true  Transportation Needs: No Transportation Needs (02/15/2023)   PRAPARE - Administrator, Civil Service (Medical): No    Lack of Transportation (Non-Medical): No  Recent Concern: Transportation Needs - Unmet Transportation Needs (02/14/2023)   PRAPARE - Transportation    Lack of Transportation (Medical): Yes    Lack of Transportation (Non-Medical): Yes  Physical Activity: Not on file  Stress: Not on file  Social Connections: Socially Isolated (02/15/2023)  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]    Frequency of Communication with Friends and Family: Once a week    Frequency of Social Gatherings with Friends and Family: Once a week    Attends Religious Services: Never    Database administrator or Organizations: No    Attends Engineer, structural: Never    Marital Status: Never married   Past Medical History:  Past Medical History:  Diagnosis Date   Anxiety    Arthritis    knees and hands   Bipolar 1 disorder, depressed (HCC)    Bipolar disorder (HCC)    Depression    GERD (gastroesophageal reflux disease)    Hepatitis    HEP "C"   History of kidney stones    Hypertension    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 echo 5/17: EF 60-65%, no RWMA, LV dias fxn nl, LA mildly dilated, RV sys fxn nl, PASP nl, moderate sized circumferential pericardial effusion was identified, 2.12 cm around the LV free wall, <1 cm around the RV free wall. Features were not c/w tamponade physiology   PTSD (post-traumatic stress disorder)    Witnessed brother's suicide.   Restless leg syndrome    Seizures (HCC)    Syncope     Past Surgical History:  Procedure Laterality Date   APPENDECTOMY     CYSTOSCOPY WITH URETEROSCOPY AND STENT PLACEMENT     ESOPHAGOGASTRODUODENOSCOPY N/A 01/11/2016   Procedure: ESOPHAGOGASTRODUODENOSCOPY (EGD);  Surgeon: Charlott Rakes, MD;  Location: [facility] ENDOSCOPY;  Service: Endoscopy;  Laterality: N/A;   ESOPHAGOGASTRODUODENOSCOPY N/A 04/09/2020   Procedure: ESOPHAGOGASTRODUODENOSCOPY (EGD);  Surgeon: [name], MD;  Location: [facility] ENDOSCOPY;  Service: Gastroenterology;  Laterality: N/A;   INCISION AND DRAINAGE ABSCESS Left 01/02/2018   Procedure: INCISION AND DRAINAGE LEFT KNEE;  Surgeon: Deeann Saint, MD;  Location: [facility] ORS;  Service: Orthopedics;  Laterality: Left;   JOINT REPLACEMENT Right    TKR   KNEE ARTHROSCOPY Right 06/25/2014   Procedure: ARTHROSCOPY KNEE;  Surgeon: Deeann Saint, MD;  Location: [facility] ORS;  Service: Orthopedics;  Laterality: Right;  partial arthroscopic medial menisectomy   LAPAROSCOPIC APPENDECTOMY N/A 06/02/2021   Procedure: APPENDECTOMY LAPAROSCOPIC;  Surgeon: Campbell Lerner, MD;  Location: [facility] ORS;  Service: General;  Laterality: N/A;   TOTAL KNEE ARTHROPLASTY Right 04/22/2015   Procedure: TOTAL KNEE ARTHROPLASTY;  Surgeon: Deeann Saint, MD;  Location: [facility] ORS;  Service: Orthopedics;  Laterality: Right;   TOTAL KNEE ARTHROPLASTY Left 10/30/2017   Procedure: TOTAL KNEE ARTHROPLASTY;  Surgeon: Deeann Saint, MD;  Location: [facility] ORS;  Service: Orthopedics;  Laterality: Left;   TOTAL KNEE REVISION Left 01/02/2018    Procedure: poly exchange of tibia and patella left knee;  Surgeon: Deeann Saint, MD;  Location: [facility] ORS;  Service: Orthopedics;  Laterality: Left;   UMBILICAL HERNIA REPAIR  06/02/2021   Procedure: HERNIA REPAIR UMBILICAL ADULT;  Surgeon: Campbell Lerner, MD;  Location: [facility] ORS;  Service: General;;    Current Medications: Current Facility-Administered Medications  Medication Dose Route Frequency Provider Last Rate Last Admin   acetaminophen (TYLENOL) tablet 650 mg  650 mg Oral Q6H PRN Lauree Chandler, NP   650 mg at 02/26/23 2124   Or   acetaminophen (TYLENOL) suppository 650 mg  650 mg Rectal Q6H PRN Lauree Chandler, NP       divalproex (DEPAKOTE) DR tablet 500 mg  500 mg Oral BID Lewanda Rife,  MD   500 mg at 02/27/23 0925   doxepin (SINEQUAN) capsule 50 mg  50 mg Oral QHS [name], MD   50 mg at 02/26/23 2125   feeding supplement (ENSURE ENLIVE / ENSURE PLUS) liquid 237 mL  237 mL Oral TID BM Lewanda Rife, MD   237 mL at 02/27/23 1217   hydrOXYzine (ATARAX) tablet 25 mg  25 mg Oral Q6H PRN [name], MD   25 mg at 02/27/23 1224   levOCARNitine (CARNITOR) 1 GM/10ML solution 500 mg  500 mg Oral Daily Lauree Chandler, NP   500 mg at 02/21/23 1048   lurasidone (LATUDA) tablet 80 mg  80 mg Oral Q supper Lewanda Rife, MD   80 mg at 02/27/23 1559   melatonin tablet 5 mg  5 mg Oral QHS Lewanda Rife, MD   5 mg at 02/21/23 2126   metoprolol succinate (TOPROL-XL) 24 hr tablet 50 mg  50 mg Oral Daily Lewanda Rife, MD   50 mg at 02/26/23 5176   multivitamin with minerals tablet 1 tablet  1 tablet Oral Daily Lewanda Rife, MD   1 tablet at 02/27/23 0925   OLANZapine (ZYPREXA) injection 5 mg  5 mg Intramuscular TID PRN Lauree Chandler, NP       OLANZapine zydis (ZYPREXA) disintegrating tablet 5 mg  5 mg Oral TID PRN Lauree Chandler, NP       tamsulosin [facility]) capsule 0.4 mg  0.4 mg Oral QPC supper Lewanda Rife, MD   0.4 mg at 02/26/23  1631   temazepam (RESTORIL) capsule 15 mg  15 mg Oral QHS Lewanda Rife, MD   15 mg at 02/26/23 2125   traMADol (ULTRAM) tablet 50 mg  50 mg Oral Q6H PRN Lewanda Rife, MD   50 mg at 02/27/23 1559   venlafaxine XR (EFFEXOR-XR) 24 hr capsule 150 mg  150 mg Oral QPC breakfast [name], MD   150 mg at 02/27/23 1607    Lab Results:  No results found for this or any previous visit (from the past 48 hours).    Blood Alcohol level:  Lab Results  Component Value Date   ETH <10 02/12/2023   ETH <10 12/18/2022    Metabolic Disorder Labs: Lab Results  Component Value Date   HGBA1C 5.5 12/29/2022   MPG 111.15 12/29/2022   MPG 111 04/16/2022   No results found for: "PROLACTIN" Lab Results  Component Value Date   CHOL 208 (H) 12/29/2022   TRIG 145 12/29/2022   HDL 33 (L) 12/29/2022   CHOLHDL 6.3 12/29/2022   VLDL 29 12/29/2022   LDLCALC 146 (H) 12/29/2022   LDLCALC 125 (H) 04/16/2022    Physical Findings: AIMS:  , ,  ,  ,    CIWA:    COWS:      Psychiatric Specialty Exam:  Presentation  General Appearance:  Appropriate for Environment; Casual  Eye Contact: Fleeting  Speech: Clear and Coherent  Speech Volume: Normal    Mood and Affect  Mood: Depressed; Hopeless; Irritable  Affect: Flat; Depressed   Thought Process  Thought Processes: Coherent  Descriptions of Associations:Intact  Orientation:Full (Time, Place and Person)  Thought Content:Illogical; Perseveration  Hallucinations: auditory hallucinations, does not explain further  Ideas of Reference:None  Suicidal Thoughts:Suicidal Thoughts: Yes, Passive SI Passive Intent and/or Plan: Without Intent; Without Plan  Homicidal Thoughts:Homicidal Thoughts: No   Sensorium  Memory: Recent Fair; Immediate Fair; Remote Fair  Judgment: Impaired  Insight: Shallow   Executive  Functions  Concentration: Fair  Attention Span: Fair  Recall: [employer] of  Knowledge: Fair  Language: Fair   Psychomotor Activity  Psychomotor Activity: Psychomotor Activity: Normal  Musculoskeletal: Strength & Muscle Tone: within normal limits Gait & Station: normal Assets  Assets: Manufacturing systems engineer; Desire for Improvement; Physical Health    Physical Exam: Physical Exam Vitals and nursing note reviewed.  HENT:     Head: Normocephalic.     Nose: Nose normal.     Mouth/Throat:     Mouth: Mucous membranes are moist.  Eyes:     Pupils: Pupils are equal, round, and reactive to light.  Cardiovascular:     Rate and Rhythm: Normal rate.  Pulmonary:     Breath sounds: Normal breath sounds.  Abdominal:     General: Bowel sounds are normal.  Skin:    General: Skin is warm.  Neurological:     General: No focal deficit present.     Mental Status: He is alert.    Review of Systems  Constitutional: Negative.   HENT: Negative.    Eyes: Negative.   Respiratory: Negative.    Cardiovascular: Negative.   Gastrointestinal: Negative.   Skin: Negative.   Neurological: Negative.    Blood pressure 103/75, pulse 80, temperature (!) 97.5 F (36.4 C), resp. rate 18, SpO2 98%. There is no height or weight on file to calculate BMI.  Diagnosis: Principal Problem:   Bipolar disorder, current episode depressed, severe, with psychotic features (HCC) Active Problems:   Cocaine use   Rhabdomyolysis   Seizures (HCC)   PLAN: Safety and Monitoring:  -- Voluntary admission to inpatient psychiatric unit for safety, stabilization and treatment  -- Daily contact with patient to assess and evaluate symptoms and progress in treatment  -- Patient's case to be discussed in multi-disciplinary team meeting  -- Observation Level : q15 minute checks  -- Vital signs:  q12 hours  -- Precautions: suicide, elopement, and assault -- Encouraged patient to participate in unit milieu and in scheduled group therapies  2. Psychiatric Diagnoses and Treatment:    Depakote  500 mg by mouth twice daily for mood stabilization and seizure d/o- will check Depakote levels -Continue Latuda to 80 mg daily.  Dose increased 02/18/2023 -Melatonin 5 mg at bedtime for insomnia - Effexor XR 150 mg to help with depression  - Vistaril was discontinued as not effective and added doxepin 25 mg at bedtime for insomnia on 02/24/23;  titrated up to 50mg  at bedtime on 02/25/23     3. Medical Issues Being Addressed:  Metoprolol for SVT and HTN   Ck down to 226, WNL. Patient encouraged to drink water and keep himself hydrated  4. Discharge Planning:   -- Social work and case management to assist with discharge planning and identification of hospital follow-up needs prior to discharge  -- Estimated LOS: 3-4 days  Timmie Foerster, MD 02/27/2023, 4:27 PM

## 2023-02-27 NOTE — Plan of Care (Signed)
  Problem: Activity: Goal: Interest or engagement in leisure activities will improve Outcome: Progressing Goal: Imbalance in normal sleep/wake cycle will improve Outcome: Progressing   Problem: Health Behavior/Discharge Planning: Goal: Ability to make decisions will improve Outcome: Progressing Goal: Compliance with therapeutic regimen will improve Outcome: Progressing   Problem: Safety: Goal: Ability to disclose and discuss suicidal ideas will improve Outcome: Progressing   Problem: Self-Concept: Goal: Level of anxiety will decrease Outcome: Progressing

## 2023-02-27 NOTE — Plan of Care (Signed)
  Problem: Education: Goal: Ability to make informed decisions regarding treatment will improve Outcome: Not Progressing   Problem: Coping: Goal: Coping ability will improve Outcome: Progressing

## 2023-02-27 NOTE — Progress Notes (Signed)
   02/27/23 1200  Psych Admission Type (Psych Patients Only)  Admission Status Voluntary  Psychosocial Assessment  Patient Complaints Anxiety;Depression  Eye Contact Brief  Facial Expression Flat  Affect Flat  Speech Logical/coherent  Interaction Minimal  Motor Activity Slow  Appearance/Hygiene Unremarkable  Behavior Characteristics Anxious  Mood Depressed  Thought Process  Coherency WDL  Content WDL  Delusions None reported or observed  Perception Hallucinations  Hallucination Auditory  Judgment Impaired  Confusion None  Danger to Self  Current suicidal ideation? Passive  Description of Suicide Plan no plan  Agreement Not to Harm Self Yes  Description of Agreement verbal  Danger to Others  Danger to Others None reported or observed

## 2023-02-28 MED ORDER — DIVALPROEX SODIUM 250 MG PO DR TAB
750.0000 mg | DELAYED_RELEASE_TABLET | Freq: Two times a day (BID) | ORAL | Status: DC
  Administered 2023-02-28 – 2023-03-07 (×14): 750 mg via ORAL
  Filled 2023-02-28 (×15): qty 3

## 2023-02-28 NOTE — Plan of Care (Signed)
  Problem: Self-Concept: Goal: Level of anxiety will decrease Outcome: Progressing   Problem: Education: Goal: Ability to make informed decisions regarding treatment will improve Outcome: Progressing   Problem: Coping: Goal: Coping ability will improve Outcome: Progressing   Problem: Medication: Goal: Compliance with prescribed medication regimen will improve Outcome: Progressing   Problem: Self-Concept: Goal: Ability to disclose and discuss suicidal ideas will improve Outcome: Progressing Goal: Will verbalize positive feelings about self Outcome: Progressing

## 2023-02-28 NOTE — Progress Notes (Signed)
   02/28/23 0558  15 Minute Checks  Location Bedroom  Visual Appearance Calm  Behavior Sleeping  Sleep (Behavioral Health Patients Only)  Calculate sleep? (Click Yes once per 24 hr at 0600 safety check) Yes  Documented sleep last 24 hours 8.75

## 2023-02-28 NOTE — Group Note (Signed)
Recreation Therapy Group Note   Group Topic:Emotion Expression  Group Date: 02/28/2023 Start Time: 1500 End Time: 1600 Facilitators: Rosina Lowenstein, LRT, CTRS Location:  Dayroom  Group Description: Painting a Diplomatic Services operational officer. Patients and LRT discuss what it means to be "at peace", what it feels like physically and mentally. Pts are given a canvas and watercolor paint to use and encouraged to draw their idea of a peaceful place. Pts and LRT discuss how they use this in their daily life post discharge. Pts are encouraged to take their canvas home with them as a reminder to find their peaceful place whenever they are feeling depressed, anxious, etc.    Goal Area(s) Addressed:  Patient will identify what it means to experience a "peaceful" emotion. Patient will identify a new coping skill.  Patient will express their emotions through art. Patients will increase communication by talking with LRT and peers while in group.   Affect/Mood: N/A   Participation Level: Did not attend    Clinical Observations/Individualized Feedback: Patient did not attend group.   Plan: Continue to engage patient in RT group sessions 2-3x/week.   Rosina Lowenstein, LRT, CTRS 02/28/2023 4:28 PM

## 2023-02-28 NOTE — Progress Notes (Signed)
[facility] MD Progress Note  02/28/2023  [name]  MRN:  [id number] [name] is a 63 y.o. male who was initially medically admitted after presenting to [facility] on 02/12/23 for suicidal thoughts, seizures. Was noted to have elevated CK and admitted to hospitalist service for management of rhabdomyolysis. Patient is transferred to geropsych unit after he is medically cleared.   Subjective:  Chart reviewed, case discussed in multidisciplinary meeting, patient seen during rounds.  Not much change in mental status since past week.  Staff reports that patient isolates himself to his room.  Staff advised to lock patient's door during daytime to encourage participation in milieu and groups.  Patient reports that voices are still there but not in "that bad".  He reports off and on suicidal thoughts, denies any intention to harm himself on the unit.  Patient was provided with support and reassurance.  Patient was encouraged to attend groups and work on coping strategies.  Sleep:Variable  Appetite:  Fair  Past Psychiatric History: see h&P Family History:  Family History  Problem Relation Age of Onset   CVA Mother        deceased at age 43   Depression Brother        Died by suicide at age 44   Social History:  Social History   Substance and Sexual Activity  Alcohol Use Not Currently   Comment: rare     Social History   Substance and Sexual Activity  Drug Use Yes   Types: Marijuana, Cocaine   Comment: last use January 2025, cocaine    Social History   Socioeconomic History   Marital status: Single    Spouse name: Not on file   Number of children: Not on file   Years of education: Not on file   Highest education level: Not on file  Occupational History   Not on file  Tobacco Use   Smoking status: Former    Current packs/day: 0.00    Average packs/day: 0.8 packs/day for 20.0 years (15.0 ttl pk-yrs)    Types: Cigarettes    Start date: 05/16/1964    Quit date: 05/16/1984     Years since quitting: 38.8   Smokeless tobacco: Never  Vaping Use   Vaping status: Never Used  Substance and Sexual Activity   Alcohol use: Not Currently    Comment: rare   Drug use: Yes    Types: Marijuana, Cocaine    Comment: last use January 2025, cocaine   Sexual activity: Not Currently  Other Topics Concern   Not on file  Social History Narrative   ** Merged History Encounter **       Social Drivers of Health   Financial Resource Strain: Not on file  Food Insecurity: No Food Insecurity (02/15/2023)   Hunger Vital Sign    Worried About Running Out of Food in the Last Year: Never true    Ran Out of Food in the Last Year: Never true  Recent Concern: Food Insecurity - Food Insecurity Present (12/20/2022)   Hunger Vital Sign    Worried About Running Out of Food in the Last Year: Sometimes true    Ran Out of Food in the Last Year: Sometimes true  Transportation Needs: No Transportation Needs (02/15/2023)   PRAPARE - Administrator, Civil Service (Medical): No    Lack of Transportation (Non-Medical): No  Recent Concern: Transportation Needs - Unmet Transportation Needs (02/14/2023)   PRAPARE - Transportation    Lack  of Transportation (Medical): Yes    Lack of Transportation (Non-Medical): Yes  Physical Activity: Not on file  Stress: Not on file  Social Connections: Socially Isolated (02/15/2023)   Social Connection and Isolation Panel [NHANES]    Frequency of Communication with Friends and Family: Once a week    Frequency of Social Gatherings with Friends and Family: Once a week    Attends Religious Services: Never    Database administrator or Organizations: No    Attends Engineer, structural: Never    Marital Status: Never married   Past Medical History:  Past Medical History:  Diagnosis Date   Anxiety    Arthritis    knees and hands   Bipolar 1 disorder, depressed (HCC)    Bipolar disorder (HCC)    Depression    GERD (gastroesophageal reflux  disease)    Hepatitis    HEP "C"   History of kidney stones    Hypertension    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 echo 5/17: EF 60-65%, no RWMA, LV dias fxn nl, LA mildly dilated, RV sys fxn nl, PASP nl, moderate sized circumferential pericardial effusion was identified, 2.12 cm around the LV free wall, <1 cm around the RV free wall. Features were not c/w tamponade physiology   PTSD (post-traumatic stress disorder)    Witnessed brother's suicide.   Restless leg syndrome    Seizures (HCC)    Syncope     Past Surgical History:  Procedure Laterality Date   APPENDECTOMY     CYSTOSCOPY WITH URETEROSCOPY AND STENT PLACEMENT     ESOPHAGOGASTRODUODENOSCOPY N/A 01/11/2016   Procedure: ESOPHAGOGASTRODUODENOSCOPY (EGD);  Surgeon: Charlott Rakes, MD;  Location: [facility] ENDOSCOPY;  Service: Endoscopy;  Laterality: N/A;   ESOPHAGOGASTRODUODENOSCOPY N/A 04/09/2020   Procedure: ESOPHAGOGASTRODUODENOSCOPY (EGD);  Surgeon: [name], MD;  Location: [facility] ENDOSCOPY;  Service: Gastroenterology;  Laterality: N/A;   INCISION AND DRAINAGE ABSCESS Left 01/02/2018   Procedure: INCISION AND DRAINAGE LEFT KNEE;  Surgeon: Deeann Saint, MD;  Location: [facility] ORS;  Service: Orthopedics;  Laterality: Left;   JOINT REPLACEMENT Right    TKR   KNEE ARTHROSCOPY Right 06/25/2014   Procedure: ARTHROSCOPY KNEE;  Surgeon: Deeann Saint, MD;  Location: [facility] ORS;  Service: Orthopedics;  Laterality: Right;  partial arthroscopic medial menisectomy   LAPAROSCOPIC APPENDECTOMY N/A 06/02/2021   Procedure: APPENDECTOMY LAPAROSCOPIC;  Surgeon: Campbell Lerner, MD;  Location: [facility] ORS;  Service: General;  Laterality: N/A;   TOTAL KNEE ARTHROPLASTY Right 04/22/2015   Procedure: TOTAL KNEE ARTHROPLASTY;  Surgeon: Deeann Saint, MD;  Location: [facility] ORS;  Service: Orthopedics;  Laterality: Right;   TOTAL KNEE ARTHROPLASTY Left 10/30/2017   Procedure: TOTAL KNEE  ARTHROPLASTY;  Surgeon: Deeann Saint, MD;  Location: [facility] ORS;  Service: Orthopedics;  Laterality: Left;   TOTAL KNEE REVISION Left 01/02/2018   Procedure: poly exchange of tibia and patella left knee;  Surgeon: Deeann Saint, MD;  Location: [facility] ORS;  Service: Orthopedics;  Laterality: Left;   UMBILICAL HERNIA REPAIR  06/02/2021   Procedure: HERNIA REPAIR UMBILICAL ADULT;  Surgeon: Campbell Lerner, MD;  Location: [facility] ORS;  Service: General;;    Current Medications: Current Facility-Administered Medications  Medication Dose Route Frequency Provider Last Rate Last Admin   acetaminophen (TYLENOL) tablet 650 mg  650 mg Oral Q6H PRN Lauree Chandler, NP   650 mg at 02/28/23 0900   Or   acetaminophen (TYLENOL) suppository  650 mg  650 mg Rectal Q6H PRN Lauree Chandler, NP       divalproex (DEPAKOTE) DR tablet 500 mg  500 mg Oral BID Lewanda Rife, MD   500 mg at 02/28/23 0859   doxepin (SINEQUAN) capsule 50 mg  50 mg Oral QHS [name], MD   50 mg at 02/27/23 2107   feeding supplement (ENSURE ENLIVE / ENSURE PLUS) liquid 237 mL  237 mL Oral TID BM Lewanda Rife, MD   237 mL at 02/28/23 0903   hydrOXYzine (ATARAX) tablet 25 mg  25 mg Oral Q6H PRN [name], MD   25 mg at 02/28/23 0859   levOCARNitine (CARNITOR) 1 GM/10ML solution 500 mg  500 mg Oral Daily Lauree Chandler, NP   500 mg at 02/21/23 1048   lurasidone (LATUDA) tablet 80 mg  80 mg Oral Q supper Lewanda Rife, MD   80 mg at 02/27/23 1559   melatonin tablet 5 mg  5 mg Oral QHS Lewanda Rife, MD   5 mg at 02/21/23 2126   metoprolol succinate (TOPROL-XL) 24 hr tablet 50 mg  50 mg Oral Daily Lewanda Rife, MD   50 mg at 02/26/23 1610   multivitamin with minerals tablet 1 tablet  1 tablet Oral Daily Lewanda Rife, MD   1 tablet at 02/28/23 0900   OLANZapine (ZYPREXA) injection 5 mg  5 mg Intramuscular TID PRN Lauree Chandler, NP       OLANZapine zydis (ZYPREXA) disintegrating tablet 5 mg  5  mg Oral TID PRN Lauree Chandler, NP       tamsulosin [facility]) capsule 0.4 mg  0.4 mg Oral QPC supper Lewanda Rife, MD   0.4 mg at 02/27/23 1808   temazepam (RESTORIL) capsule 15 mg  15 mg Oral QHS Lewanda Rife, MD   15 mg at 02/27/23 2107   traMADol (ULTRAM) tablet 50 mg  50 mg Oral Q6H PRN Lewanda Rife, MD   50 mg at 02/27/23 2242   venlafaxine XR (EFFEXOR-XR) 24 hr capsule 150 mg  150 mg Oral QPC breakfast [name], MD   150 mg at 02/28/23 9604    Lab Results:  No results found for this or any previous visit (from the past 48 hours).    Blood Alcohol level:  Lab Results  Component Value Date   ETH <10 02/12/2023   ETH <10 12/18/2022    Metabolic Disorder Labs: Lab Results  Component Value Date   HGBA1C 5.5 12/29/2022   MPG 111.15 12/29/2022   MPG 111 04/16/2022   No results found for: "PROLACTIN" Lab Results  Component Value Date   CHOL 208 (H) 12/29/2022   TRIG 145 12/29/2022   HDL 33 (L) 12/29/2022   CHOLHDL 6.3 12/29/2022   VLDL 29 12/29/2022   LDLCALC 146 (H) 12/29/2022   LDLCALC 125 (H) 04/16/2022      Psychiatric Specialty Exam:  Presentation  General Appearance:  Appropriate for Environment; Casual  Eye Contact: Fleeting  Speech: Clear and Coherent  Speech Volume: Normal    Mood and Affect  Mood: Depressed, hopeless  Affect: Flat; Depressed   Thought Process  Thought Processes: Coherent  Descriptions of Associations:Intact  Orientation:Full (Time, Place and Person)  Thought Content:Illogical; Perseveration  Hallucinations: auditory hallucinations, does not explain further  Ideas of Reference:None  Suicidal Thoughts:Passive SI  Homicidal Thoughts:Denies   Sensorium  Memory: Recent Fair; Immediate Fair; Remote Fair  Judgment: Limited  Insight: Shallow   Executive Functions  Concentration: Fair  Attention Span: Fair  Recall: [employer] of  Knowledge: Fair  Language: Fair   Psychomotor Activity  Psychomotor Activity: Decreased  Musculoskeletal: Strength & Muscle Tone: within normal limits Gait & Station: normal Assets  Assets: Manufacturing systems engineer; Desire for Improvement; Physical Health    Physical Exam: Physical Exam Vitals and nursing note reviewed.  HENT:     Head: Normocephalic.     Nose: Nose normal.     Mouth/Throat:     Mouth: Mucous membranes are moist.  Eyes:     Pupils: Pupils are equal, round, and reactive to light.  Cardiovascular:     Rate and Rhythm: Normal rate.  Pulmonary:     Breath sounds: Normal breath sounds.  Abdominal:     General: Bowel sounds are normal.  Skin:    General: Skin is warm.  Neurological:     General: No focal deficit present.     Mental Status: He is alert.    Review of Systems  Constitutional: Negative.   HENT: Negative.    Eyes: Negative.   Respiratory: Negative.    Cardiovascular: Negative.   Gastrointestinal: Negative.   Skin: Negative.   Neurological: Negative.    Blood pressure (!) 116/93, pulse 84, temperature 98.2 F (36.8 C), resp. rate 16, SpO2 97%. There is no height or weight on file to calculate BMI.  Diagnosis: Principal Problem:   Bipolar disorder, current episode depressed, severe, with psychotic features (HCC) Active Problems:   Cocaine use   Rhabdomyolysis   Seizures (HCC)   PLAN: Safety and Monitoring:  -- Voluntary admission to inpatient psychiatric unit for safety, stabilization and treatment  -- Daily contact with patient to assess and evaluate symptoms and progress in treatment  -- Patient's case to be discussed in multi-disciplinary team meeting  -- Observation Level : q15 minute checks  -- Vital signs:  q12 hours  -- Precautions: suicide, elopement, and assault -- Encouraged patient to participate in unit milieu and in scheduled group therapies  2. Psychiatric Diagnoses and Treatment:    Depakote 500 mg by mouth  twice daily for mood stabilization and seizure d/o- Level at 31  - will increase the dose to 750 mg po BID, 02/28/23 -Continue Latuda to 80 mg daily.  Dose increased 02/18/2023 -Melatonin 5 mg at bedtime for insomnia - Effexor XR 150 mg to help with depression  - Vistaril was discontinued as not effective and added doxepin 25 mg at bedtime for insomnia on 02/24/23;  titrated up to 50mg  at bedtime on 02/25/23   VPA 02/25/23 low at 31   3. Medical Issues Being Addressed:  Metoprolol for SVT and HTN   Ck down to 226, WNL. Patient encouraged to drink water and keep himself hydrated  4. Discharge Planning:   -- Social work and case management to assist with discharge planning and identification of hospital follow-up needs prior to discharge  -- Estimated LOS: 3-4 days  Lewanda Rife, MD

## 2023-02-28 NOTE — Plan of Care (Signed)
  Problem: Activity: Goal: Imbalance in normal sleep/wake cycle will improve Outcome: Progressing   Problem: Coping: Goal: Coping ability will improve Outcome: Progressing Goal: Will verbalize feelings Outcome: Progressing   Problem: Health Behavior/Discharge Planning: Goal: Ability to make decisions will improve Outcome: Progressing Goal: Compliance with therapeutic regimen will improve Outcome: Progressing   Problem: Safety: Goal: Ability to disclose and discuss suicidal ideas will improve Outcome: Progressing

## 2023-02-28 NOTE — Progress Notes (Signed)
   02/28/23 0714  Psych Admission Type (Psych Patients Only)  Admission Status Voluntary  Psychosocial Assessment  Patient Complaints Depression  Eye Contact Brief  Facial Expression Flat  Affect Depressed  Speech Logical/coherent  Interaction Isolative  Motor Activity Slow  Appearance/Hygiene Unremarkable  Behavior Characteristics Cooperative  Mood Depressed  Thought Process  Coherency WDL  Content WDL  Delusions None reported or observed  Perception WDL  Hallucination Auditory  Judgment Impaired  Confusion None  Danger to Self  Current suicidal ideation? Passive

## 2023-02-28 NOTE — Group Note (Signed)
[facility] LCSW Group Therapy Note    Group Date: 02/28/2023 Start Time: 1300 End Time: 1400  Type of Therapy and Topic:  Group Therapy:  Overcoming Obstacles  Participation Level:  [facility] PARTICIPATION LEVEL: Minimal  Mood:  Description of Group:   In this group patients will be encouraged to explore what they see as obstacles to their own wellness and recovery. They will be guided to discuss their thoughts, feelings, and behaviors related to these obstacles. The group will process together ways to cope with barriers, with attention given to specific choices patients can make. Each patient will be challenged to identify changes they are motivated to make in order to overcome their obstacles. This group will be process-oriented, with patients participating in exploration of their own experiences as well as giving and receiving support and challenge from other group members.  Therapeutic Goals: 1. Patient will identify personal and current obstacles as they relate to admission. 2. Patient will identify barriers that currently interfere with their wellness or overcoming obstacles.  3. Patient will identify feelings, thought process and behaviors related to these barriers. 4. Patient will identify two changes they are willing to make to overcome these obstacles:    Summary of Patient Progress   Pt participated during group with minimal insight into topic. Pt did not share out to group members but was respectful to others.    Therapeutic Modalities:   Cognitive Behavioral Therapy Solution Focused Therapy Motivational Interviewing Relapse Prevention Therapy   [name], LCSWA

## 2023-02-28 NOTE — Group Note (Signed)
Recreation Therapy Group Note   Group Topic:Stress Management  Group Date: 02/28/2023 Start Time: 1100 End Time: 1130 Facilitators: Rosina Lowenstein, LRT, CTRS Location:  Dayroom  Group Description: Meditation. LRT and patients discussed what they know about meditation and mindfulness. LRT played a Deep Breathing Meditation exercise script for patients to follow along to. LRT and patients discussed how meditation and deep breathing can be used as a coping skill post--discharge to help manage symptoms of stress.   Goal Area(s) Addressed: Patient will practice using relaxation technique. Patient will identify a new coping skill.  Patient will follow multistep directions to reduce anxiety and stress.   Affect/Mood: Appropriate   Participation Level: Active and Engaged   Participation Quality: Independent   Behavior: Calm and Cooperative   Speech/Thought Process: Coherent   Insight: Good   Judgement: Good   Modes of Intervention: Activity   Patient Response to Interventions:  Attentive and Receptive   Education Outcome:  Acknowledges education   Clinical Observations/Individualized Feedback: Nathanie was active in their participation of session activities and group discussion. Pt appropriately followed along to the prompt. Pt interacted well with LRT and peers duration of session.     Plan: Continue to engage patient in RT group sessions 2-3x/week.   Rosina Lowenstein, LRT, CTRS 02/28/2023 1:18 PM

## 2023-03-01 MED ORDER — LOPERAMIDE HCL 2 MG PO CAPS
2.0000 mg | ORAL_CAPSULE | ORAL | Status: DC | PRN
  Administered 2023-03-01 – 2023-03-06 (×4): 2 mg via ORAL
  Filled 2023-03-01 (×4): qty 1

## 2023-03-01 NOTE — Group Note (Signed)
Recreation Therapy Group Note   Group Topic:Other  Group Date: 03/01/2023 Start Time: 1400 End Time: 1445 Facilitators: Rosina Lowenstein, LRT, CTRS Location:  Dayroom  Activity Description/Intervention: Therapeutic Drumming. Patients with peers and staff were given the opportunity to engage in a leader facilitated [facility] Group Empowerment Drumming Circle with staff from the [employer], in partnership with [employer]. Teaching laboratory technician and trained [employer], Theodoro Doing leading with LRT observing and documenting intervention and pt response. This evidenced-based practice targets 7 areas of health and wellbeing in the human experience including: stress-reduction, exercise, self-expression, camaraderie/support, nurturing, spirituality, and music-making (leisure).    Goal Area(s) Addresses:  Patient will engage in pro-social way in music group.  Patient will follow directions of drum leader on the first prompt. Patient will demonstrate no behavioral issues during group.  Patient will identify if a reduction in stress level occurs as a result of participation in therapeutic drum circle.     Affect/Mood: Appropriate   Participation Level: Active   Participation Quality: Independent   Behavior: Appropriate   Speech/Thought Process: Coherent   Insight: Fair   Judgement: Fair    Modes of Intervention: Activity and Music   Patient Response to Interventions:  Engaged   Education Outcome:  Acknowledges education   Clinical Observations/Individualized Feedback: Gage was active in their participation of session activities and group discussion. Pt followed along appropriately while interacting well with LRT and peers duration of session.      Plan: Continue to engage patient in RT group sessions 2-3x/week.   Rosina Lowenstein, LRT, CTRS 03/01/2023 2:54 PM

## 2023-03-01 NOTE — Group Note (Signed)
Recreation Therapy Group Note   Group Topic:Relaxation  Group Date: 03/01/2023 Start Time: 1100 End Time: 1135 Facilitators: Rosina Lowenstein, LRT, CTRS Location:  Dayroom  Group Description: PMR (Progressive Muscle Relaxation). LRT educates patients on what PMR is and the benefits that come from it. Patients are asked to sit with their feet flat on the floor while sitting up and all the way back in their chair, if possible. LRT and pts follow a prompt through a speaker that requires you to tense and release different muscles in their body and focus on their breathing. During session, lights are off and soft music is being played.  Goal Area(s) Addressed:  Patients will be able to describe progressive muscle relaxation.  Patient will practice using relaxation technique. Patient will identify a new coping skill.  Patient will follow multistep directions to reduce anxiety and stress.   Affect/Mood: Appropriate   Participation Level: Active and Engaged   Participation Quality: Independent   Behavior: Calm and Cooperative   Speech/Thought Process: Coherent   Insight: Good   Judgement: Good   Modes of Intervention: Activity and Education   Patient Response to Interventions:  Attentive, Engaged, Interested , and Receptive   Education Outcome:  Acknowledges education   Clinical Observations/Individualized Feedback: Sung was active in their participation of session activities and group discussion. Pt followed along appropriatly to the prompts. Pt interacted well with LRT and peers duration of session.    Plan: Continue to engage patient in RT group sessions 2-3x/week.   Rosina Lowenstein, LRT, CTRS 03/01/2023 12:28 PM

## 2023-03-01 NOTE — Progress Notes (Signed)
Physical Therapy Group Note  Group Topic:  Home Safety Modifications for Fall Prevention, Fall Recovery Technique Group Date: 03/01/2023 Group Time (start and end): 1610-9604 Facilitators:  Cephus Slater, PT; Elly Modena, PT  Group Description: Group discussed various home safety modifications to optimize safety, minimize fall risk in home environment.  Brainstormed and discussed opportunities throughout specific rooms/spaces of the home (entryway, kitchen, bathroom, bedroom and main living spaces), encouraging active participation with ideas and suggestions throughout session.  Provided handout with home safety checklist ([employer] on Aging) for reference outside of group time.  Encouraged each participant to voice a specific modification that he/she could implement at discharge to improve safety of his/her own home environment.  Additionally, reviewed strategy/technique and safety considerations for fall recovery.  Reviewed importance of calling emergency services as necessary and waiting for assistance to arrive if injury suspected.  If injury ruled out, did review fall recovery technique, transitioning from supine --> quadruped --> tall kneeling --> standing while using solid support surface for stabilization.  Therapist demonstrated technique, encouraging group participants to problem-solve and sequence correct technique.  Therapeutic Goal(s): Identify and discuss home safety modifications for primary living spaces of home environment. Identify and discuss fall recovery techniques.   Individual Participation:  Patient receptive to all information provided.  Did actively engage with conversation and discussion, but often required min cuing/encouragement to initiate.  Able to brainstorm and provide examples of home safety modifications specific to his own home environment.  Verbalized understanding of fall recovery strategies (declined performance of task).    Identified  installation of grab bars (in bathroom) as his primary take-away and home safety modification for use at discharge.   Participation Level and Quality: Appropriate, does require min cuing for active conversation/questions   Behavior: Appropriate   Speech/Thought Process: Appropriate   Affect/Mood: Slightly flat affect, limited eye contact   Insight: Fair/good   Judgement: Fair/good   Modes of Intervention: Verbal discussion, demonstration, handout provided     Plan: Continue to engage patient in PT/OT groups 1-2x/week.  Herrick Hartog H. Manson Passey, PT, DPT, NCS 03/01/23, 2:54 PM [phone]

## 2023-03-01 NOTE — [facility] Counselor (Signed)
CSW spoke with pt again about discharge plan. Pt reports he wants psychiatry at [facility] but reports last time he went he became frustrated because there was a line during the walk-in hours.   CSW will call [facility] to schedule pt for follow-up per his request   Reynaldo Minium, MSW, [facility] 03/01/2023 2:47 PM

## 2023-03-01 NOTE — Progress Notes (Addendum)
Pt reported that he is starting to feel better, denied thought, plan or intent to harm self. Pt refused his Melatonin, stated it causes him diarrhea. Pt is managed on q 15 min rounds.   02/28/23 2200  Psych Admission Type (Psych Patients Only)  Admission Status Voluntary  Psychosocial Assessment  Patient Complaints Depression  Eye Contact Brief  Facial Expression Flat  Affect Flat  Speech Logical/coherent  Interaction Isolative  Motor Activity Slow  Appearance/Hygiene In scrubs  Behavior Characteristics Cooperative  Mood Depressed  Thought Process  Coherency WDL  Content WDL  Delusions None reported or observed  Perception WDL  Hallucination None reported or observed  Judgment Impaired  Confusion None  Danger to Self  Current suicidal ideation? Denies  Self-Injurious Behavior No self-injurious ideation or behavior indicators observed or expressed   Agreement Not to Harm Self Yes  Description of Agreement verbbal  Danger to Others  Danger to Others None reported or observed

## 2023-03-01 NOTE — Progress Notes (Signed)
Patient admitted voluntarily to [facility] for  Bipolar depression.  Pt has been isolative and quiet today.  States he has diarrhea and he was treated with imodium.  Still participated in group with encouragement.  Given ultram and atarax for c/o pain and anxiety. Patient is working toward discharge.  Endorses anxiety and on and off passive SI.  Will continue to monitor.

## 2023-03-01 NOTE — Progress Notes (Signed)
[facility] MD Progress Note  03/01/2023  Philippe Colon Kabir  MRN:  [id number] Harshith Colon Maffett is a 63 y.o. male who was initially medically admitted after presenting to [facility] on 02/12/23 for suicidal thoughts, seizures. Was noted to have elevated CK and admitted to hospitalist service for management of rhabdomyolysis. Patient is transferred to geropsych unit after he is medically cleared.   Subjective:  Chart reviewed, case discussed in multidisciplinary meeting, patient seen during rounds.  Staff reported that patient is more visible on the unit.  He has attended few groups.  Today patient complains of diarrhea.  Patient reports that it is probably due to Carnitor. Patient agrees to take loperamide to help with diarrhea.  Patient reports that voices are quieter and reports less frequent suicidal thoughts.  denies any intention to harm himself on the unit.  Patient was provided with support and reassurance.  Patient was encouraged to attend groups and work on coping strategies.  Sleep:Improved  Appetite:  Fair  Past Psychiatric History: see h&P Family History:  Family History  Problem Relation Age of Onset  . CVA Mother        deceased at age 95  . Depression Brother        Died by suicide at age 87   Social History:  Social History   Substance and Sexual Activity  Alcohol Use Not Currently   Comment: rare     Social History   Substance and Sexual Activity  Drug Use Yes  . Types: Marijuana, Cocaine   Comment: last use January 2025, cocaine    Social History   Socioeconomic History  . Marital status: Single    Spouse name: Not on file  . Number of children: Not on file  . Years of education: Not on file  . Highest education level: Not on file  Occupational History  . Not on file  Tobacco Use  . Smoking status: Former    Current packs/day: 0.00    Average packs/day: 0.8 packs/day for 20.0 years (15.0 ttl pk-yrs)    Types: Cigarettes    Start date: 05/16/1964    Quit date:  05/16/1984    Years since quitting: 38.8  . Smokeless tobacco: Never  Vaping Use  . Vaping status: Never Used  Substance and Sexual Activity  . Alcohol use: Not Currently    Comment: rare  . Drug use: Yes    Types: Marijuana, Cocaine    Comment: last use January 2025, cocaine  . Sexual activity: Not Currently  Other Topics Concern  . Not on file  Social History Narrative   ** Merged History Encounter **       Social Drivers of Health   Financial Resource Strain: Not on file  Food Insecurity: No Food Insecurity (02/15/2023)   Hunger Vital Sign   . Worried About Programme researcher, broadcasting/film/video in the Last Year: Never true   . Ran Out of Food in the Last Year: Never true  Recent Concern: Food Insecurity - Food Insecurity Present (12/20/2022)   Hunger Vital Sign   . Worried About Programme researcher, broadcasting/film/video in the Last Year: Sometimes true   . Ran Out of Food in the Last Year: Sometimes true  Transportation Needs: No Transportation Needs (02/15/2023)   PRAPARE - Transportation   . Lack of Transportation (Medical): No   . Lack of Transportation (Non-Medical): No  Recent Concern: Transportation Needs - Unmet Transportation Needs (02/14/2023)   PRAPARE - Transportation   . Lack of  Transportation (Medical): Yes   . Lack of Transportation (Non-Medical): Yes  Physical Activity: Not on file  Stress: Not on file  Social Connections: Socially Isolated (02/15/2023)   Social Connection and Isolation Panel [NHANES]   . Frequency of Communication with Friends and Family: Once a week   . Frequency of Social Gatherings with Friends and Family: Once a week   . Attends Religious Services: Never   . Active Member of Clubs or Organizations: No   . Attends Banker Meetings: Never   . Marital Status: Never married   Past Medical History:  Past Medical History:  Diagnosis Date  . Anxiety   . Arthritis    knees and hands  . Bipolar 1 disorder, depressed (HCC)   . Bipolar disorder (HCC)   . Depression    . GERD (gastroesophageal reflux disease)   . Hepatitis    HEP "C"  . History of kidney stones   . Hypertension   . 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 Kidney stones   . Pericarditis 05/2015   a. echo 5/17: EF 60-65%, no RWMA, LV dias fxn nl, LA mildly dilated, RV sys fxn nl, PASP nl, moderate sized circumferential pericardial effusion was identified, 2.12 cm around the LV free wall, <1 cm around the RV free wall. Features were not c/w tamponade physiology  . PTSD (post-traumatic stress disorder)    Witnessed brother's suicide.  Marland Kitchen Restless leg syndrome   . Seizures (HCC)   . Syncope     Past Surgical History:  Procedure Laterality Date  . APPENDECTOMY    . CYSTOSCOPY WITH URETEROSCOPY AND STENT PLACEMENT    . ESOPHAGOGASTRODUODENOSCOPY N/A 01/11/2016   Procedure: ESOPHAGOGASTRODUODENOSCOPY (EGD);  Surgeon: Charlott Rakes, MD;  Location: [facility] ENDOSCOPY;  Service: Endoscopy;  Laterality: N/A;  . ESOPHAGOGASTRODUODENOSCOPY N/A 04/09/2020   Procedure: ESOPHAGOGASTRODUODENOSCOPY (EGD);  Surgeon: [name], MD;  Location: [facility] ENDOSCOPY;  Service: Gastroenterology;  Laterality: N/A;  . INCISION AND DRAINAGE ABSCESS Left 01/02/2018   Procedure: INCISION AND DRAINAGE LEFT KNEE;  Surgeon: Deeann Saint, MD;  Location: [facility] ORS;  Service: Orthopedics;  Laterality: Left;  . JOINT REPLACEMENT Right    TKR  . KNEE ARTHROSCOPY Right 06/25/2014   Procedure: ARTHROSCOPY KNEE;  Surgeon: Deeann Saint, MD;  Location: [facility] ORS;  Service: Orthopedics;  Laterality: Right;  partial arthroscopic medial menisectomy  . LAPAROSCOPIC APPENDECTOMY N/A 06/02/2021   Procedure: APPENDECTOMY LAPAROSCOPIC;  Surgeon: Campbell Lerner, MD;  Location: [facility] ORS;  Service: General;  Laterality: N/A;  . TOTAL KNEE ARTHROPLASTY Right 04/22/2015   Procedure: TOTAL KNEE ARTHROPLASTY;  Surgeon: Deeann Saint, MD;  Location: [facility] ORS;  Service: Orthopedics;  Laterality: Right;  . TOTAL KNEE  ARTHROPLASTY Left 10/30/2017   Procedure: TOTAL KNEE ARTHROPLASTY;  Surgeon: Deeann Saint, MD;  Location: [facility] ORS;  Service: Orthopedics;  Laterality: Left;  . TOTAL KNEE REVISION Left 01/02/2018   Procedure: poly exchange of tibia and patella left knee;  Surgeon: Deeann Saint, MD;  Location: [facility] ORS;  Service: Orthopedics;  Laterality: Left;  . UMBILICAL HERNIA REPAIR  06/02/2021   Procedure: HERNIA REPAIR UMBILICAL ADULT;  Surgeon: Campbell Lerner, MD;  Location: [facility] ORS;  Service: General;;    Current Medications: Current Facility-Administered Medications  Medication Dose Route Frequency Provider Last Rate Last Admin  . acetaminophen (TYLENOL) tablet 650 mg  650 mg Oral Q6H PRN Lauree Chandler, NP   650 mg at 02/28/23 0900   Or  . acetaminophen (TYLENOL) suppository 650  mg  650 mg Rectal Q6H PRN Lauree Chandler, NP      . divalproex (DEPAKOTE) DR tablet 750 mg  750 mg Oral BID Lewanda Rife, MD   750 mg at 03/01/23 4540  . doxepin (SINEQUAN) capsule 50 mg  50 mg Oral QHS [name], MD   50 mg at 02/28/23 2142  . feeding supplement (ENSURE ENLIVE / ENSURE PLUS) liquid 237 mL  237 mL Oral TID BM Lewanda Rife, MD   237 mL at 03/01/23 0841  . hydrOXYzine (ATARAX) tablet 25 mg  25 mg Oral Q6H PRN [name], MD   25 mg at 03/01/23 0843  . levOCARNitine (CARNITOR) 1 GM/10ML solution 500 mg  500 mg Oral Daily Lauree Chandler, NP   500 mg at 02/21/23 1048  . lurasidone (LATUDA) tablet 80 mg  80 mg Oral Q supper Lewanda Rife, MD   80 mg at 02/28/23 1517  . melatonin tablet 5 mg  5 mg Oral QHS Lewanda Rife, MD   5 mg at 02/21/23 2126  . metoprolol succinate (TOPROL-XL) 24 hr tablet 50 mg  50 mg Oral Daily Lewanda Rife, MD   50 mg at 02/26/23 0907  . multivitamin with minerals tablet 1 tablet  1 tablet Oral Daily Lewanda Rife, MD   1 tablet at 03/01/23 [phone]  . OLANZapine (ZYPREXA) injection 5 mg  5 mg Intramuscular TID PRN Lauree Chandler, NP      . OLANZapine zydis (ZYPREXA) disintegrating tablet 5 mg  5 mg Oral TID PRN Lauree Chandler, NP      . tamsulosin (FLOMAX) capsule 0.4 mg  0.4 mg Oral QPC supper Lewanda Rife, MD   0.4 mg at 02/28/23 1616  . temazepam (RESTORIL) capsule 15 mg  15 mg Oral QHS Lewanda Rife, MD   15 mg at 02/28/23 2143  . traMADol (ULTRAM) tablet 50 mg  50 mg Oral Q6H PRN Lewanda Rife, MD   50 mg at 03/01/23 0843  . venlafaxine XR (EFFEXOR-XR) 24 hr capsule 150 mg  150 mg Oral QPC breakfast [name], MD   150 mg at 03/01/23 9147    Lab Results:  No results found for this or any previous visit (from the past 48 hours).    Blood Alcohol level:  Lab Results  Component Value Date   ETH <10 02/12/2023   ETH <10 12/18/2022    Metabolic Disorder Labs: Lab Results  Component Value Date   HGBA1C 5.5 12/29/2022   MPG 111.15 12/29/2022   MPG 111 04/16/2022   No results found for: "PROLACTIN" Lab Results  Component Value Date   CHOL 208 (H) 12/29/2022   TRIG 145 12/29/2022   HDL 33 (L) 12/29/2022   CHOLHDL 6.3 12/29/2022   VLDL 29 12/29/2022   LDLCALC 146 (H) 12/29/2022   LDLCALC 125 (H) 04/16/2022      Psychiatric Specialty Exam:  Psychiatric Specialty Exam:   Presentation  General Appearance:  Appropriate for Environment; Casual   Eye Contact: Fair   Speech: Clear and Coherent   Speech Volume: Normal       Mood and Affect  Mood: Depressed   Affect: Constricted    Thought Process  Thought Processes: Coherent   Descriptions of Associations:Intact   Orientation:Full (Time, Place and Person)   Thought Content:Perseveration   Hallucinations: auditory hallucinations, reports voices are quieter and less intense  Ideas of Reference:None   Suicidal Thoughts:Passive SI   Homicidal Thoughts:Denies     Sensorium  Memory: Recent Fair; Immediate Fair; Remote Fair   Judgment: Limited   Insight: Shallow     Executive Functions   Concentration: Fair   Attention Span: Fair   Recall: [employer] of Knowledge: Fair   Language: Fair     Psychomotor Activity  Psychomotor Activity: Decreased   Musculoskeletal: Strength & Muscle Tone: within normal limits Gait & Station: normal Assets  Assets: Manufacturing systems engineer; Desire for Improvement; Physical Health   Physical Exam: Physical Exam Vitals and nursing note reviewed.  HENT:     Head: Normocephalic.     Nose: Nose normal.     Mouth/Throat:     Mouth: Mucous membranes are moist.  Eyes:     Pupils: Pupils are equal, round, and reactive to light.  Cardiovascular:     Rate and Rhythm: Normal rate.  Pulmonary:     Breath sounds: Normal breath sounds.  Abdominal:     General: Bowel sounds are normal.  Skin:    General: Skin is warm.  Neurological:     General: No focal deficit present.     Mental Status: He is alert.   Review of Systems  Constitutional: Negative.   HENT: Negative.    Eyes: Negative.   Respiratory: Negative.    Cardiovascular: Negative.   Gastrointestinal: Negative.   Skin: Negative.   Neurological: Negative.    Blood pressure (!) 117/98, pulse 98, temperature 97.9 F (36.6 C), resp. rate 14, SpO2 99%. There is no height or weight on file to calculate BMI.  Diagnosis: Principal Problem:   Bipolar disorder, current episode depressed, severe, with psychotic features (HCC) Active Problems:   Cocaine use   Rhabdomyolysis   Seizures (HCC)   PLAN: Safety and Monitoring:  -- Voluntary admission to inpatient psychiatric unit for safety, stabilization and treatment  -- Daily contact with patient to assess and evaluate symptoms and progress in treatment  -- Patient's case to be discussed in multi-disciplinary team meeting  -- Observation Level : q15 minute checks  -- Vital signs:  q12 hours  -- Precautions: suicide, elopement, and assault -- Encouraged patient to participate in unit milieu and in scheduled group  therapies  2. Psychiatric Diagnoses and Treatment:    Depakote 500 mg by mouth twice daily for mood stabilization and seizure d/o- Level at 31  - will increase the dose to 750 mg po BID, 02/28/23 -Continue Latuda to 80 mg daily.  Dose increased 02/18/2023 -Melatonin 5 mg at bedtime for insomnia - Effexor XR 150 mg to help with depression  - Vistaril was discontinued as not effective and added doxepin 25 mg at bedtime for insomnia on 02/24/23;  titrated up to 50mg  at bedtime on 02/25/23   VPA 02/25/23 low at 31   3. Medical Issues Being Addressed:  Metoprolol for SVT and HTN   4. Discharge Planning:   -- Social work and case management to assist with discharge planning and identification of hospital follow-up needs prior to discharge  -- Estimated LOS: 5-6 days  Lewanda Rife, MD

## 2023-03-02 NOTE — Group Note (Signed)
Date:  03/02/2023 Time:  9:58 PM  Group Topic/Focus:  Overcoming Stress:   The focus of this group is to define stress and help patients assess their triggers.    Participation Level:  Did Not Attend  Participation Quality:   Did Not Attend  Affect:   Did Not Attend  Cognitive:   Did Not Attend  Insight: None  Engagement in Group:  None  Modes of Intervention:  Exploration  Additional Comments:    Garry Heater 03/02/2023, 9:58 PM

## 2023-03-02 NOTE — Plan of Care (Signed)

## 2023-03-02 NOTE — Progress Notes (Signed)
Patient was admitted to [facility] for bipolar depression.  Patient mostly isolated in his room today but came out for meals. Received his pain medications tramadol and atarax twice today. States he showered last night when asked about hygiene. Endorses anxiety, depression and passive SI but states is getting better. Will continue to monitor.

## 2023-03-02 NOTE — Group Note (Unsigned)
Date:  03/02/2023 Time:  4:54 AM  Group Topic/Focus:  Building Self Esteem:   The Focus of this group is helping patients become aware of the effects of self-esteem on their lives, the things they and others do that enhance or undermine their self-esteem, seeing the relationship between their level of self-esteem and the choices they make and learning ways to enhance self-esteem.     Participation Level:  {BHH PARTICIPATION WJXBJ:47829}  Participation Quality:  {BHH PARTICIPATION QUALITY:22265}  Affect:  {BHH AFFECT:22266}  Cognitive:  {BHH COGNITIVE:22267}  Insight: {BHH Insight2:20797}  Engagement in Group:  {BHH ENGAGEMENT IN FAOZH:08657}  Modes of Intervention:  {BHH MODES OF INTERVENTION:22269}  Additional Comments:  ***  Mylisa Brunson 03/02/2023, 4:54 AM

## 2023-03-02 NOTE — Progress Notes (Signed)
[facility] MD Progress Note  03/02/2023  [name]  MRN:  [id number] [name] is a 63 y.o. male who was initially medically admitted after presenting to [facility] on 02/12/23 for suicidal thoughts, seizures. Was noted to have elevated CK and admitted to hospitalist service for management of rhabdomyolysis. Patient is transferred to geropsych unit after he is medically cleared.   Subjective:  Chart reviewed, case discussed in multidisciplinary meeting, patient seen during rounds.  Staff reported that patient is more visible on the unit.  He has attended few groups.  He reports that he feels" a little better".  He said suicide thoughts are "not bad".  Today patient denies diarrhea so far.  Patient reports that voices are quieter Patient was provided with support and reassurance.  Patient was encouraged to attend groups and work on coping strategies.  Sleep:Improved  Appetite:  Fair  Past Psychiatric History: see h&P Family History:  Family History  Problem Relation Age of Onset   CVA Mother        deceased at age 35   Depression Brother        Died by suicide at age 52   Social History:  Social History   Substance and Sexual Activity  Alcohol Use Not Currently   Comment: rare     Social History   Substance and Sexual Activity  Drug Use Yes   Types: Marijuana, Cocaine   Comment: last use January 2025, cocaine    Social History   Socioeconomic History   Marital status: Single    Spouse name: Not on file   Number of children: Not on file   Years of education: Not on file   Highest education level: Not on file  Occupational History   Not on file  Tobacco Use   Smoking status: Former    Current packs/day: 0.00    Average packs/day: 0.8 packs/day for 20.0 years (15.0 ttl pk-yrs)    Types: Cigarettes    Start date: 05/16/1964    Quit date: 05/16/1984    Years since quitting: 38.8   Smokeless tobacco: Never  Vaping Use   Vaping status: Never Used  Substance and Sexual  Activity   Alcohol use: Not Currently    Comment: rare   Drug use: Yes    Types: Marijuana, Cocaine    Comment: last use January 2025, cocaine   Sexual activity: Not Currently  Other Topics Concern   Not on file  Social History Narrative   ** Merged History Encounter **       Social Drivers of Health   Financial Resource Strain: Not on file  Food Insecurity: No Food Insecurity (02/15/2023)   Hunger Vital Sign    Worried About Running Out of Food in the Last Year: Never true    Ran Out of Food in the Last Year: Never true  Recent Concern: Food Insecurity - Food Insecurity Present (12/20/2022)   Hunger Vital Sign    Worried About Running Out of Food in the Last Year: Sometimes true    Ran Out of Food in the Last Year: Sometimes true  Transportation Needs: No Transportation Needs (02/15/2023)   PRAPARE - Administrator, Civil Service (Medical): No    Lack of Transportation (Non-Medical): No  Recent Concern: Transportation Needs - Unmet Transportation Needs (02/14/2023)   PRAPARE - Administrator, Civil Service (Medical): Yes    Lack of Transportation (Non-Medical): Yes  Physical Activity: Not on file  Stress: Not on file  Social Connections: Socially Isolated (02/15/2023)   Social Connection and Isolation Panel [NHANES]    Frequency of Communication with Friends and Family: Once a week    Frequency of Social Gatherings with Friends and Family: Once a week    Attends Religious Services: Never    Database administrator or Organizations: No    Attends Engineer, structural: Never    Marital Status: Never married   Past Medical History:  Past Medical History:  Diagnosis Date   Anxiety    Arthritis    knees and hands   Bipolar 1 disorder, depressed (HCC)    Bipolar disorder (HCC)    Depression    GERD (gastroesophageal reflux disease)    Hepatitis    HEP "C"   History of kidney stones    Hypertension    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 echo 5/17: EF 60-65%, no RWMA, LV dias fxn nl, LA mildly dilated, RV sys fxn nl, PASP nl, moderate sized circumferential pericardial effusion was identified, 2.12 cm around the LV free wall, <1 cm around the RV free wall. Features were not c/w tamponade physiology   PTSD (post-traumatic stress disorder)    Witnessed brother's suicide.   Restless leg syndrome    Seizures (HCC)    Syncope     Past Surgical History:  Procedure Laterality Date   APPENDECTOMY     CYSTOSCOPY WITH URETEROSCOPY AND STENT PLACEMENT     ESOPHAGOGASTRODUODENOSCOPY N/A 01/11/2016   Procedure: ESOPHAGOGASTRODUODENOSCOPY (EGD);  Surgeon: Charlott Rakes, MD;  Location: [facility] ENDOSCOPY;  Service: Endoscopy;  Laterality: N/A;   ESOPHAGOGASTRODUODENOSCOPY N/A 04/09/2020   Procedure: ESOPHAGOGASTRODUODENOSCOPY (EGD);  Surgeon: [name], MD;  Location: [facility] ENDOSCOPY;  Service: Gastroenterology;  Laterality: N/A;   INCISION AND DRAINAGE ABSCESS Left 01/02/2018   Procedure: INCISION AND DRAINAGE LEFT KNEE;  Surgeon: Deeann Saint, MD;  Location: [facility] ORS;  Service: Orthopedics;  Laterality: Left;   JOINT REPLACEMENT Right    TKR   KNEE ARTHROSCOPY Right 06/25/2014   Procedure: ARTHROSCOPY KNEE;  Surgeon: Deeann Saint, MD;  Location: [facility] ORS;  Service: Orthopedics;  Laterality: Right;  partial arthroscopic medial menisectomy   LAPAROSCOPIC APPENDECTOMY N/A 06/02/2021   Procedure: APPENDECTOMY LAPAROSCOPIC;  Surgeon: Campbell Lerner, MD;  Location: [facility] ORS;  Service: General;  Laterality: N/A;   TOTAL KNEE ARTHROPLASTY Right 04/22/2015   Procedure: TOTAL KNEE ARTHROPLASTY;  Surgeon: Deeann Saint, MD;  Location: [facility] ORS;  Service: Orthopedics;  Laterality: Right;   TOTAL KNEE ARTHROPLASTY Left 10/30/2017   Procedure: TOTAL KNEE ARTHROPLASTY;  Surgeon: Deeann Saint, MD;  Location: [facility] ORS;  Service: Orthopedics;  Laterality: Left;   TOTAL KNEE REVISION  Left 01/02/2018   Procedure: poly exchange of tibia and patella left knee;  Surgeon: Deeann Saint, MD;  Location: [facility] ORS;  Service: Orthopedics;  Laterality: Left;   UMBILICAL HERNIA REPAIR  06/02/2021   Procedure: HERNIA REPAIR UMBILICAL ADULT;  Surgeon: Campbell Lerner, MD;  Location: [facility] ORS;  Service: General;;    Current Medications: Current Facility-Administered Medications  Medication Dose Route Frequency Provider Last Rate Last Admin   acetaminophen (TYLENOL) tablet 650 mg  650 mg Oral Q6H PRN Lauree Chandler, NP   650 mg at 02/28/23 0900   Or   acetaminophen (TYLENOL) suppository 650 mg  650 mg Rectal Q6H PRN Lauree Chandler, NP       divalproex (  DEPAKOTE) DR tablet 750 mg  750 mg Oral BID Lewanda Rife, MD   750 mg at 03/02/23 0913   doxepin (SINEQUAN) capsule 50 mg  50 mg Oral QHS [name], MD   50 mg at 03/01/23 2139   feeding supplement (ENSURE ENLIVE / ENSURE PLUS) liquid 237 mL  237 mL Oral TID BM Lewanda Rife, MD   237 mL at 03/02/23 0914   hydrOXYzine (ATARAX) tablet 25 mg  25 mg Oral Q6H PRN [name], MD   25 mg at 03/02/23 0758   levOCARNitine (CARNITOR) 1 GM/10ML solution 500 mg  500 mg Oral Daily Lauree Chandler, NP   500 mg at 03/02/23 1610   loperamide (IMODIUM) capsule 2 mg  2 mg Oral PRN Lewanda Rife, MD   2 mg at 03/01/23 1431   lurasidone (LATUDA) tablet 80 mg  80 mg Oral Q supper Lewanda Rife, MD   80 mg at 03/01/23 1730   melatonin tablet 5 mg  5 mg Oral QHS Lewanda Rife, MD   5 mg at 02/21/23 2126   metoprolol succinate (TOPROL-XL) 24 hr tablet 50 mg  50 mg Oral Daily Lewanda Rife, MD   50 mg at 02/26/23 9604   multivitamin with minerals tablet 1 tablet  1 tablet Oral Daily Lewanda Rife, MD   1 tablet at 03/02/23 0913   OLANZapine (ZYPREXA) injection 5 mg  5 mg Intramuscular TID PRN Lauree Chandler, NP       OLANZapine zydis (ZYPREXA) disintegrating tablet 5 mg  5 mg Oral TID PRN Lauree Chandler, NP       tamsulosin [facility]) capsule 0.4 mg  0.4 mg Oral QPC supper Lewanda Rife, MD   0.4 mg at 03/01/23 1731   temazepam (RESTORIL) capsule 15 mg  15 mg Oral QHS Lewanda Rife, MD   15 mg at 03/01/23 2139   traMADol (ULTRAM) tablet 50 mg  50 mg Oral Q6H PRN Lewanda Rife, MD   50 mg at 03/02/23 0757   venlafaxine XR (EFFEXOR-XR) 24 hr capsule 150 mg  150 mg Oral QPC breakfast [name], MD   150 mg at 03/02/23 5409    Lab Results:  No results found for this or any previous visit (from the past 48 hours).    Blood Alcohol level:  Lab Results  Component Value Date   ETH <10 02/12/2023   ETH <10 12/18/2022    Metabolic Disorder Labs: Lab Results  Component Value Date   HGBA1C 5.5 12/29/2022   MPG 111.15 12/29/2022   MPG 111 04/16/2022   No results found for: "PROLACTIN" Lab Results  Component Value Date   CHOL 208 (H) 12/29/2022   TRIG 145 12/29/2022   HDL 33 (L) 12/29/2022   CHOLHDL 6.3 12/29/2022   VLDL 29 12/29/2022   LDLCALC 146 (H) 12/29/2022   LDLCALC 125 (H) 04/16/2022      Psychiatric Specialty Exam:  Psychiatric Specialty Exam:   Presentation  General Appearance:  Appropriate for Environment; Casual   Eye Contact: Fair   Speech: Clear and Coherent   Speech Volume: Normal       Mood and Affect  Mood: " Little better"   Affect: Less Constricted    Thought Process  Thought Processes: Coherent   Descriptions of Associations:Intact   Orientation:Full (Time, Place and Person)   Thought Content:Improving   Hallucinations: auditory hallucinations, reports voices are quieter and less intense  Ideas of Reference:None   Suicidal Thoughts:Passive SI   Homicidal  Thoughts:Denies     Sensorium  Memory: Recent Fair; Immediate Fair; Remote Fair   Judgment: Limited   Insight: Shallow     Executive Functions  Concentration: Fair   Attention Span: Fair   Recall: [employer] of Knowledge: Fair    Language: Fair     Psychomotor Activity  Psychomotor Activity: Decreased   Musculoskeletal: Strength & Muscle Tone: within normal limits Gait & Station: normal Assets  Assets: Manufacturing systems engineer; Desire for Improvement; Physical Health   Physical Exam: Physical Exam Vitals and nursing note reviewed.  HENT:     Head: Normocephalic.     Nose: Nose normal.     Mouth/Throat:     Mouth: Mucous membranes are moist.  Eyes:     Pupils: Pupils are equal, round, and reactive to light.  Cardiovascular:     Rate and Rhythm: Normal rate.  Pulmonary:     Breath sounds: Normal breath sounds.  Abdominal:     General: Bowel sounds are normal.  Skin:    General: Skin is warm.  Neurological:     General: No focal deficit present.     Mental Status: He is alert.    Review of Systems  Constitutional: Negative.   HENT: Negative.    Eyes: Negative.   Respiratory: Negative.    Cardiovascular: Negative.   Gastrointestinal: Negative.   Skin: Negative.   Neurological: Negative.    Blood pressure 127/82, pulse 82, temperature 97.7 F (36.5 C), resp. rate 18, SpO2 97%. There is no height or weight on file to calculate BMI.  Diagnosis: Principal Problem:   Bipolar disorder, current episode depressed, severe, with psychotic features (HCC) Active Problems:   Cocaine use   Rhabdomyolysis   Seizures (HCC)   PLAN: Safety and Monitoring:  -- Voluntary admission to inpatient psychiatric unit for safety, stabilization and treatment  -- Daily contact with patient to assess and evaluate symptoms and progress in treatment  -- Patient's case to be discussed in multi-disciplinary team meeting  -- Observation Level : q15 minute checks  -- Vital signs:  q12 hours  -- Precautions: suicide, elopement, and assault -- Encouraged patient to participate in unit milieu and in scheduled group therapies  2. Psychiatric Diagnoses and Treatment:    Depakote 500 mg by mouth twice daily for mood  stabilization and seizure d/o- Level at 31  - will increase the dose to 750 mg po BID, 02/28/23 -Continue Latuda to 80 mg daily.  Dose increased 02/18/2023 -Melatonin 5 mg at bedtime for insomnia - Effexor XR 150 mg to help with depression  - Vistaril was discontinued as not effective and added doxepin 25 mg at bedtime for insomnia on 02/24/23;  titrated up to 50mg  at bedtime on 02/25/23   VPA 02/25/23 low at 31   3. Medical Issues Being Addressed:  Metoprolol for SVT and HTN   4. Discharge Planning:   -- Social work and case management to assist with discharge planning and identification of hospital follow-up needs prior to discharge  -- Estimated LOS: 5-6 days  Lewanda Rife, MD

## 2023-03-02 NOTE — Plan of Care (Signed)
  Problem: Education: Goal: Utilization of techniques to improve thought processes will improve Outcome: Progressing Goal: Knowledge of the prescribed therapeutic regimen will improve Outcome: Progressing   Problem: Activity: Goal: Interest or engagement in leisure activities will improve Outcome: Progressing Goal: Imbalance in normal sleep/wake cycle will improve Outcome: Progressing   Problem: Coping: Goal: Coping ability will improve Outcome: Progressing   

## 2023-03-03 NOTE — Progress Notes (Signed)
   03/03/23 0615  15 Minute Checks  Location Bedroom  Visual Appearance Calm  Behavior Sleeping  Sleep (Behavioral Health Patients Only)  Calculate sleep? (Click Yes once per 24 hr at 0600 safety check) Yes  Documented sleep last 24 hours 10.75

## 2023-03-03 NOTE — Progress Notes (Signed)
[facility] MD Progress Note  03/03/2023  [name]  MRN:  [id number] [name] is a 63 y.o. male who was initially medically admitted after presenting to [facility] on 02/12/23 for suicidal thoughts, seizures. Was noted to have elevated CK and admitted to hospitalist service for management of rhabdomyolysis. Patient is transferred to geropsych unit after he is medically cleared.   Subjective:  Chart reviewed, case discussed in multidisciplinary meeting, patient seen during rounds.  Staff reported that patient is more visible on the unit.  He has attended few groups.  No new acute events overnight he reports that he feels" fine ".  He said suicide thoughts are "not bad".  Patient reports that voices are quieter, patient does not think his meds need to be adjusted at this point in time.  He said that overall he feels better, he thinks he is getting close to his baseline.  Patient was provided with support and reassurance.  Patient was encouraged to attend groups and work on coping strategies.  Sleep:Improved  Appetite:  Fair  Past Psychiatric History: see h&P Family History:  Family History  Problem Relation Age of Onset   CVA Mother        deceased at age 4   Depression Brother        Died by suicide at age 34   Social History:  Social History   Substance and Sexual Activity  Alcohol Use Not Currently   Comment: rare     Social History   Substance and Sexual Activity  Drug Use Yes   Types: Marijuana, Cocaine   Comment: last use January 2025, cocaine    Social History   Socioeconomic History   Marital status: Single    Spouse name: Not on file   Number of children: Not on file   Years of education: Not on file   Highest education level: Not on file  Occupational History   Not on file  Tobacco Use   Smoking status: Former    Current packs/day: 0.00    Average packs/day: 0.8 packs/day for 20.0 years (15.0 ttl pk-yrs)    Types: Cigarettes    Start date: 05/16/1964     Quit date: 05/16/1984    Years since quitting: 38.8   Smokeless tobacco: Never  Vaping Use   Vaping status: Never Used  Substance and Sexual Activity   Alcohol use: Not Currently    Comment: rare   Drug use: Yes    Types: Marijuana, Cocaine    Comment: last use January 2025, cocaine   Sexual activity: Not Currently  Other Topics Concern   Not on file  Social History Narrative   ** Merged History Encounter **       Social Drivers of Health   Financial Resource Strain: Not on file  Food Insecurity: No Food Insecurity (02/15/2023)   Hunger Vital Sign    Worried About Running Out of Food in the Last Year: Never true    Ran Out of Food in the Last Year: Never true  Recent Concern: Food Insecurity - Food Insecurity Present (12/20/2022)   Hunger Vital Sign    Worried About Running Out of Food in the Last Year: Sometimes true    Ran Out of Food in the Last Year: Sometimes true  Transportation Needs: No Transportation Needs (02/15/2023)   PRAPARE - Administrator, Civil Service (Medical): No    Lack of Transportation (Non-Medical): No  Recent Concern: Transportation Needs - Barrister's clerk  Needs (02/14/2023)   PRAPARE - Administrator, Civil Service (Medical): Yes    Lack of Transportation (Non-Medical): Yes  Physical Activity: Not on file  Stress: Not on file  Social Connections: Socially Isolated (02/15/2023)   Social Connection and Isolation Panel [NHANES]    Frequency of Communication with Friends and Family: Once a week    Frequency of Social Gatherings with Friends and Family: Once a week    Attends Religious Services: Never    Database administrator or Organizations: No    Attends Engineer, structural: Never    Marital Status: Never married   Past Medical History:  Past Medical History:  Diagnosis Date   Anxiety    Arthritis    knees and hands   Bipolar 1 disorder, depressed (HCC)    Bipolar disorder (HCC)    Depression    GERD  (gastroesophageal reflux disease)    Hepatitis    HEP "C"   History of kidney stones    Hypertension    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 echo 5/17: EF 60-65%, no RWMA, LV dias fxn nl, LA mildly dilated, RV sys fxn nl, PASP nl, moderate sized circumferential pericardial effusion was identified, 2.12 cm around the LV free wall, <1 cm around the RV free wall. Features were not c/w tamponade physiology   PTSD (post-traumatic stress disorder)    Witnessed brother's suicide.   Restless leg syndrome    Seizures (HCC)    Syncope     Past Surgical History:  Procedure Laterality Date   APPENDECTOMY     CYSTOSCOPY WITH URETEROSCOPY AND STENT PLACEMENT     ESOPHAGOGASTRODUODENOSCOPY N/A 01/11/2016   Procedure: ESOPHAGOGASTRODUODENOSCOPY (EGD);  Surgeon: Charlott Rakes, MD;  Location[facility] ENDOSCOPY;  Service: Endoscopy;  Laterality: N/A;   ESOPHAGOGASTRODUODENOSCOPY N/A 04/09/2020   Procedure: ESOPHAGOGASTRODUODENOSCOPY (EGD);  Surgeon: [name], MD;  Location: [facility] ENDOSCOPY;  Service: Gastroenterology;  Laterality: N/A;   INCISION AND DRAINAGE ABSCESS Left 01/02/2018   Procedure: INCISION AND DRAINAGE LEFT KNEE;  Surgeon: Deeann Saint, MD;  Location: [facility] ORS;  Service: Orthopedics;  Laterality: Left;   JOINT REPLACEMENT Right    TKR   KNEE ARTHROSCOPY Right 06/25/2014   Procedure: ARTHROSCOPY KNEE;  Surgeon: Deeann Saint, MD;  Location: [facility] ORS;  Service: Orthopedics;  Laterality: Right;  partial arthroscopic medial menisectomy   LAPAROSCOPIC APPENDECTOMY N/A 06/02/2021   Procedure: APPENDECTOMY LAPAROSCOPIC;  Surgeon: Campbell Lerner, MD;  Location: [facility] ORS;  Service: General;  Laterality: N/A;   TOTAL KNEE ARTHROPLASTY Right 04/22/2015   Procedure: TOTAL KNEE ARTHROPLASTY;  Surgeon: Deeann Saint, MD;  Location: [facility] ORS;  Service: Orthopedics;  Laterality: Right;   TOTAL KNEE ARTHROPLASTY Left 10/30/2017    Procedure: TOTAL KNEE ARTHROPLASTY;  Surgeon: Deeann Saint, MD;  Location: [facility] ORS;  Service: Orthopedics;  Laterality: Left;   TOTAL KNEE REVISION Left 01/02/2018   Procedure: poly exchange of tibia and patella left knee;  Surgeon: Deeann Saint, MD;  Location: [facility] ORS;  Service: Orthopedics;  Laterality: Left;   UMBILICAL HERNIA REPAIR  06/02/2021   Procedure: HERNIA REPAIR UMBILICAL ADULT;  Surgeon: Campbell Lerner, MD;  Location: [facility] ORS;  Service: General;;    Current Medications: Current Facility-Administered Medications  Medication Dose Route Frequency Provider Last Rate Last Admin   acetaminophen (TYLENOL) tablet 650 mg  650 mg Oral Q6H PRN Lauree Chandler, NP   650 mg  at 02/28/23 0900   Or   acetaminophen (TYLENOL) suppository 650 mg  650 mg Rectal Q6H PRN Lauree Chandler, NP       divalproex (DEPAKOTE) DR tablet 750 mg  750 mg Oral BID Lewanda Rife, MD   750 mg at 03/03/23 0800   doxepin (SINEQUAN) capsule 50 mg  50 mg Oral QHS [name], MD   50 mg at 03/02/23 2121   feeding supplement (ENSURE ENLIVE / ENSURE PLUS) liquid 237 mL  237 mL Oral TID BM Lewanda Rife, MD   237 mL at 03/03/23 1409   hydrOXYzine (ATARAX) tablet 25 mg  25 mg Oral Q6H PRN [name], MD   25 mg at 03/02/23 1624   levOCARNitine (CARNITOR) 1 GM/10ML solution 500 mg  500 mg Oral Daily Lauree Chandler, NP   500 mg at 03/03/23 5784   loperamide (IMODIUM) capsule 2 mg  2 mg Oral PRN Lewanda Rife, MD   2 mg at 03/01/23 1431   lurasidone (LATUDA) tablet 80 mg  80 mg Oral Q supper Lewanda Rife, MD   80 mg at 03/02/23 1622   melatonin tablet 5 mg  5 mg Oral QHS Lewanda Rife, MD   5 mg at 02/21/23 2126   metoprolol succinate (TOPROL-XL) 24 hr tablet 50 mg  50 mg Oral Daily Lewanda Rife, MD   50 mg at 03/03/23 0800   multivitamin with minerals tablet 1 tablet  1 tablet Oral Daily Lewanda Rife, MD   1 tablet at 03/03/23 0759   OLANZapine (ZYPREXA) injection  5 mg  5 mg Intramuscular TID PRN Lauree Chandler, NP       OLANZapine zydis (ZYPREXA) disintegrating tablet 5 mg  5 mg Oral TID PRN Lauree Chandler, NP       tamsulosin [facility]) capsule 0.4 mg  0.4 mg Oral QPC supper Lewanda Rife, MD   0.4 mg at 03/02/23 1624   temazepam (RESTORIL) capsule 15 mg  15 mg Oral QHS Lewanda Rife, MD   15 mg at 03/02/23 2121   traMADol (ULTRAM) tablet 50 mg  50 mg Oral Q6H PRN Lewanda Rife, MD   50 mg at 03/03/23 0756   venlafaxine XR (EFFEXOR-XR) 24 hr capsule 150 mg  150 mg Oral QPC breakfast [name], MD   150 mg at 03/03/23 6962    Lab Results:  No results found for this or any previous visit (from the past 48 hours).    Blood Alcohol level:  Lab Results  Component Value Date   ETH <10 02/12/2023   ETH <10 12/18/2022    Metabolic Disorder Labs: Lab Results  Component Value Date   HGBA1C 5.5 12/29/2022   MPG 111.15 12/29/2022   MPG 111 04/16/2022   No results found for: "PROLACTIN" Lab Results  Component Value Date   CHOL 208 (H) 12/29/2022   TRIG 145 12/29/2022   HDL 33 (L) 12/29/2022   CHOLHDL 6.3 12/29/2022   VLDL 29 12/29/2022   LDLCALC 146 (H) 12/29/2022   LDLCALC 125 (H) 04/16/2022      Psychiatric Specialty Exam:  Psychiatric Specialty Exam:   Presentation  General Appearance:  Appropriate for Environment; Casual   Eye Contact: Fair   Speech: Clear and Coherent   Speech Volume: Normal       Mood and Affect  Mood: " Fine"   Affect: Less Constricted    Thought Process  Thought Processes: Coherent   Descriptions of Associations:Intact   Orientation:Full (Time, Place and  Person)   Thought Content:Improving   Hallucinations: auditory hallucinations, reports voices are quieter and less intense, probably at baseline  Ideas of Reference:None   Suicidal Thoughts:Positive SI   Homicidal Thoughts:Denies     Sensorium  Memory: Recent Fair; Immediate Fair; Remote Fair    Judgment: Limited   Insight: Shallow     Executive Functions  Concentration: Fair   Attention Span: Fair   Recall: [employer] of Knowledge: Fair   Language: Fair     Psychomotor Activity  Psychomotor Activity: Decreased   Musculoskeletal: Strength & Muscle Tone: within normal limits Gait & Station: normal Assets  Assets: Manufacturing systems engineer; Desire for Improvement; Physical Health   Physical Exam: Physical Exam Vitals and nursing note reviewed.  HENT:     Head: Normocephalic.     Nose: Nose normal.     Mouth/Throat:     Mouth: Mucous membranes are moist.  Eyes:     Pupils: Pupils are equal, round, and reactive to light.  Cardiovascular:     Rate and Rhythm: Normal rate.  Pulmonary:     Breath sounds: Normal breath sounds.  Abdominal:     General: Bowel sounds are normal.  Skin:    General: Skin is warm.  Neurological:     General: No focal deficit present.     Mental Status: He is alert.    Review of Systems  Constitutional: Negative.   HENT: Negative.    Eyes: Negative.   Respiratory: Negative.    Cardiovascular: Negative.   Gastrointestinal: Negative.   Skin: Negative.   Neurological: Negative.    Blood pressure (!) 131/93, pulse 80, temperature (!) 97.3 F (36.3 C), resp. rate 18, SpO2 99%. There is no height or weight on file to calculate BMI.  Diagnosis: Principal Problem:   Bipolar disorder, current episode depressed, severe, with psychotic features (HCC) Active Problems:   Cocaine use   Rhabdomyolysis   Seizures (HCC)   PLAN: Safety and Monitoring:  -- Voluntary admission to inpatient psychiatric unit for safety, stabilization and treatment  -- Daily contact with patient to assess and evaluate symptoms and progress in treatment  -- Patient's case to be discussed in multi-disciplinary team meeting  -- Observation Level : q15 minute checks  -- Vital signs:  q12 hours  -- Precautions: suicide, elopement, and assault --  Encouraged patient to participate in unit milieu and in scheduled group therapies  2. Psychiatric Diagnoses and Treatment:    Depakote 500 mg by mouth twice daily for mood stabilization and seizure d/o- Level at 31  - will increase the dose to 750 mg po BID, 02/28/23 -Continue Latuda to 80 mg daily.  Dose increased 02/18/2023 -Melatonin 5 mg at bedtime for insomnia - Effexor XR 150 mg to help with depression  - Vistaril was discontinued as not effective and added doxepin 25 mg at bedtime for insomnia on 02/24/23;  titrated up to 50mg  at bedtime on 02/25/23   VPA 02/25/23 low at 31   3. Medical Issues Being Addressed:  Metoprolol for SVT and HTN   4. Discharge Planning:   -- Social work and case management to assist with discharge planning and identification of hospital follow-up needs prior to discharge  -- Estimated LOS: 5-6 days  Lewanda Rife, MD

## 2023-03-03 NOTE — Group Note (Signed)
Date:  03/03/2023 Time:  8:33 PM  Group Topic/Focus:  Overcoming Stress:   The focus of this group is to define stress and help patients assess their triggers.    Participation Level:  Did Not Attend  Participation Quality:   Did Not Attend  Affect:   Did Not Attend  Cognitive:   Did Not Attend  Insight: None  Engagement in Group:  None  Modes of Intervention:   Did Not Attend  Additional Comments:    Garry Heater 03/03/2023, 8:33 PM

## 2023-03-03 NOTE — Progress Notes (Signed)
Occupational Therapy Group Note  Group Topic:  Estate manager/land agent, Adaptive Equipment for ADLs Group Date: 03/03/23 Group Time (start and end): [id number] Facilitators:  Wynona Canes, OT  Group Description: Group educated on safety considerations/modifications with bathing, dressing and ADL routines to maximize independence and minimize fall risk with tasks.  Reviewed and demonstrated use of shower chair and tub transfer bench as appropriate.  Reviewed and demonstrated use of adaptive equipment (sock aide, reacher, long-handled sponge) available for ADL tasks.  Reviewed and demonstrated role of activity pacing and energy conservation with functional activities.  Provided handout with visual reference of available equipment.    Therapeutic Goal(s): Verbalize and demonstrate safe technique for tub/shower transfers with DME/adaptive equipment as needed. Verbalize and demonstrate appropriate use of adaptive equipment with bathing, dressing and ADL routine. Verbalize and demonstrate appropriate use of activity pacing and energy conservation with bathing, dressing and ADL routine.  Individual Participation: Pt was present, quiet. Did not contribute to discussion unless directly asked a question. Did take handout at the end of the group.   Participation Level and Quality: Engaged   Behavior: Appropriate    Speech/Thought Process: Difficult to assess 2/2 participation    Affect/Mood: Appropriate    Insight: Difficult to assess 2/2 participation   Judgement: Difficult to assess 2/2 participation   Modes of Intervention: Verbal education, visual demonstration, hands out opportunities, handout     Plan: Continue to engage patient in PT/OT groups 1-2x/week.   Arman Filter., MPH, MS, OTR/L ascom [phone] 03/03/23, 3:59 PM

## 2023-03-03 NOTE — Plan of Care (Signed)
D: Pt alert and oriented. Pt reports experiencing anxiety/depression at this time. Pt reports experiencing 8/10 posterior neck pain at this time, prn medication given. Pt denies experiencing any HI, or AVH at this time however, reports experiencing SI w/o a plan. Pt contracts for safety and will let staff know if anything changes.   A: Scheduled medications administered to pt, per MD orders. Support and encouragement provided. Frequent verbal contact made. Routine safety checks conducted q15 minutes.   R: No adverse drug reactions noted. Pt verbally contracts for safety at this time. Pt compliant with medications. Pt interacts minimally with others on the unit. Pt remains safe at this time. Plan of care ongoing.  Problem: Activity: Goal: Interest or engagement in leisure activities will improve Outcome: Not Progressing Goal: Imbalance in normal sleep/wake cycle will improve Outcome: Not Progressing

## 2023-03-03 NOTE — Plan of Care (Signed)
  Problem: Education: Goal: Utilization of techniques to improve thought processes will improve Outcome: Adequate for Discharge Goal: Knowledge of the prescribed therapeutic regimen will improve Outcome: Adequate for Discharge   Problem: Activity: Goal: Interest or engagement in leisure activities will improve Outcome: Not Progressing Goal: Imbalance in normal sleep/wake cycle will improve Outcome: Not Progressing   Problem: Coping: Goal: Coping ability will improve Outcome: Not Progressing   Problem: Safety: Goal: Ability to disclose and discuss suicidal ideas will improve Outcome: Progressing

## 2023-03-04 NOTE — Group Note (Signed)
 [facility] LCSW Group Therapy Note   Group Date: 03/04/2023 Start Time: 1305 End Time: 1335   Type of Therapy/Topic:  Group Therapy:  Emotion Regulation  Participation Level:  Minimal    Description of Group:    The purpose of this group is to assist patients in learning to regulate negative emotions and experience positive emotions. Patients will be guided to discuss ways in which they have been vulnerable to their negative emotions. These vulnerabilities will be juxtaposed with experiences of positive emotions or situations, and patients challenged to use positive emotions to combat negative ones. Special emphasis will be placed on coping with negative emotions in conflict situations, and patients will process healthy conflict resolution skills.  Therapeutic Goals: Patient will identify two positive emotions or experiences to reflect on in order to balance out negative emotions:  Patient will label two or more emotions that they find the most difficult to experience:  Patient will be able to demonstrate positive conflict resolution skills through discussion or role plays:   Summary of Patient Progress:   Patient was alert and active in group. Patient was able to describe the way different emotions determine how we feel.       Therapeutic Modalities:   Cognitive Behavioral Therapy Feelings Identification Dialectical Behavioral Therapy   Whitney Post, LCSWA

## 2023-03-04 NOTE — Plan of Care (Signed)
  Problem: Self-Concept: Goal: Level of anxiety will decrease Outcome: Progressing   Patient denies HI and AVH this shift. Patient reported SI without a plan. Patient reports increased anxiety but has had no incidents of behavioral dyscontrol. Patient able to contract for safety. Continue to monitor as planned.

## 2023-03-04 NOTE — Progress Notes (Signed)
 [facility] MD Progress Note  03/04/2023  [name]  MRN:  [id number] [name] is a 63 y.o. male who was initially medically admitted after presenting to [facility] on 02/12/23 for suicidal thoughts, seizures. Was noted to have elevated CK and admitted to hospitalist service for management of rhabdomyolysis. Patient is transferred to geropsych unit after he is medically cleared.   Subjective:  Chart reviewed, case discussed in multidisciplinary meeting, patient seen during rounds.  Staff reported that patient is more visible on the unit.  He has attended few groups.  No new acute events overnight   During assessment today, he reports" fine" mood.  He continues to report suicide thoughts are "not bad".  Patient reports that voices are quieter, patient does not think his meds need to be adjusted at this point in time.  He said that overall he feels better, he thinks he is getting close to his baseline.  Patient was provided with support and reassurance.  Patient was encouraged to attend groups and work on coping strategies.  Sleep:Improved  Appetite:  Fair  Past Psychiatric History: see h&P Family History:  Family History  Problem Relation Age of Onset   CVA Mother        deceased at age 46   Depression Brother        Died by suicide at age 32   Social History:  Social History   Substance and Sexual Activity  Alcohol Use Not Currently   Comment: rare     Social History   Substance and Sexual Activity  Drug Use Yes   Types: Marijuana, Cocaine   Comment: last use January 2025, cocaine    Social History   Socioeconomic History   Marital status: Single    Spouse name: Not on file   Number of children: Not on file   Years of education: Not on file   Highest education level: Not on file  Occupational History   Not on file  Tobacco Use   Smoking status: Former    Current packs/day: 0.00    Average packs/day: 0.8 packs/day for 20.0 years (15.0 ttl pk-yrs)    Types:  Cigarettes    Start date: 05/16/1964    Quit date: 05/16/1984    Years since quitting: 38.8   Smokeless tobacco: Never  Vaping Use   Vaping status: Never Used  Substance and Sexual Activity   Alcohol use: Not Currently    Comment: rare   Drug use: Yes    Types: Marijuana, Cocaine    Comment: last use January 2025, cocaine   Sexual activity: Not Currently  Other Topics Concern   Not on file  Social History Narrative   ** Merged History Encounter **       Social Drivers of Health   Financial Resource Strain: Not on file  Food Insecurity: No Food Insecurity (02/15/2023)   Hunger Vital Sign    Worried About Running Out of Food in the Last Year: Never true    Ran Out of Food in the Last Year: Never true  Recent Concern: Food Insecurity - Food Insecurity Present (12/20/2022)   Hunger Vital Sign    Worried About Running Out of Food in the Last Year: Sometimes true    Ran Out of Food in the Last Year: Sometimes true  Transportation Needs: No Transportation Needs (02/15/2023)   PRAPARE - Administrator, Civil Service (Medical): No    Lack of Transportation (Non-Medical): No  Recent Concern: Transportation  Needs - Unmet Transportation Needs (02/14/2023)   PRAPARE - Administrator, Civil Service (Medical): Yes    Lack of Transportation (Non-Medical): Yes  Physical Activity: Not on file  Stress: Not on file  Social Connections: Socially Isolated (02/15/2023)   Social Connection and Isolation Panel [NHANES]    Frequency of Communication with Friends and Family: Once a week    Frequency of Social Gatherings with Friends and Family: Once a week    Attends Religious Services: Never    Database administrator or Organizations: No    Attends Engineer, structural: Never    Marital Status: Never married   Past Medical History:  Past Medical History:  Diagnosis Date   Anxiety    Arthritis    knees and hands   Bipolar 1 disorder, depressed (HCC)    Bipolar  disorder (HCC)    Depression    GERD (gastroesophageal reflux disease)    Hepatitis    HEP "C"   History of kidney stones    Hypertension    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 echo 5/17: EF 60-65%, no RWMA, LV dias fxn nl, LA mildly dilated, RV sys fxn nl, PASP nl, moderate sized circumferential pericardial effusion was identified, 2.12 cm around the LV free wall, <1 cm around the RV free wall. Features were not c/w tamponade physiology   PTSD (post-traumatic stress disorder)    Witnessed brother's suicide.   Restless leg syndrome    Seizures (HCC)    Syncope     Past Surgical History:  Procedure Laterality Date   APPENDECTOMY     CYSTOSCOPY WITH URETEROSCOPY AND STENT PLACEMENT     ESOPHAGOGASTRODUODENOSCOPY N/A 01/11/2016   Procedure: ESOPHAGOGASTRODUODENOSCOPY (EGD);  Surgeon: Charlott Rakes, MD;  Location: [facility] ENDOSCOPY;  Service: Endoscopy;  Laterality: N/A;   ESOPHAGOGASTRODUODENOSCOPY N/A 04/09/2020   Procedure: ESOPHAGOGASTRODUODENOSCOPY (EGD);  Surgeon: [name], MD;  Location: [facility] ENDOSCOPY;  Service: Gastroenterology;  Laterality: N/A;   INCISION AND DRAINAGE ABSCESS Left 01/02/2018   Procedure: INCISION AND DRAINAGE LEFT KNEE;  Surgeon: Deeann Saint, MD;  Location: [facility] ORS;  Service: Orthopedics;  Laterality: Left;   JOINT REPLACEMENT Right    TKR   KNEE ARTHROSCOPY Right 06/25/2014   Procedure: ARTHROSCOPY KNEE;  Surgeon: Deeann Saint, MD;  Location: [facility] ORS;  Service: Orthopedics;  Laterality: Right;  partial arthroscopic medial menisectomy   LAPAROSCOPIC APPENDECTOMY N/A 06/02/2021   Procedure: APPENDECTOMY LAPAROSCOPIC;  Surgeon: Campbell Lerner, MD;  Location: [facility] ORS;  Service: General;  Laterality: N/A;   TOTAL KNEE ARTHROPLASTY Right 04/22/2015   Procedure: TOTAL KNEE ARTHROPLASTY;  Surgeon: Deeann Saint, MD;  Location: [facility] ORS;  Service: Orthopedics;  Laterality: Right;   TOTAL  KNEE ARTHROPLASTY Left 10/30/2017   Procedure: TOTAL KNEE ARTHROPLASTY;  Surgeon: Deeann Saint, MD;  Location: [facility] ORS;  Service: Orthopedics;  Laterality: Left;   TOTAL KNEE REVISION Left 01/02/2018   Procedure: poly exchange of tibia and patella left knee;  Surgeon: Deeann Saint, MD;  Location: [facility] ORS;  Service: Orthopedics;  Laterality: Left;   UMBILICAL HERNIA REPAIR  06/02/2021   Procedure: HERNIA REPAIR UMBILICAL ADULT;  Surgeon: Campbell Lerner, MD;  Location: [facility] ORS;  Service: General;;    Current Medications: Current Facility-Administered Medications  Medication Dose Route Frequency Provider Last Rate Last Admin   acetaminophen (TYLENOL) tablet 650 mg  650 mg Oral Q6H PRN Lauree Chandler, NP  650 mg at 02/28/23 0900   Or   acetaminophen (TYLENOL) suppository 650 mg  650 mg Rectal Q6H PRN Lauree Chandler, NP       divalproex (DEPAKOTE) DR tablet 750 mg  750 mg Oral BID Lewanda Rife, MD   750 mg at 03/04/23 0911   doxepin (SINEQUAN) capsule 50 mg  50 mg Oral QHS [name], MD   50 mg at 03/03/23 2140   feeding supplement (ENSURE ENLIVE / ENSURE PLUS) liquid 237 mL  237 mL Oral TID BM Lewanda Rife, MD   237 mL at 03/03/23 1409   hydrOXYzine (ATARAX) tablet 25 mg  25 mg Oral Q6H PRN [name], MD   25 mg at 03/02/23 1624   levOCARNitine (CARNITOR) 1 GM/10ML solution 500 mg  500 mg Oral Daily Lauree Chandler, NP   500 mg at 03/03/23 0801   loperamide (IMODIUM) capsule 2 mg  2 mg Oral PRN Lewanda Rife, MD   2 mg at 03/03/23 1522   lurasidone (LATUDA) tablet 80 mg  80 mg Oral Q supper Lewanda Rife, MD   80 mg at 03/03/23 1713   melatonin tablet 5 mg  5 mg Oral QHS Lewanda Rife, MD   5 mg at 02/21/23 2126   metoprolol succinate (TOPROL-XL) 24 hr tablet 50 mg  50 mg Oral Daily Lewanda Rife, MD   50 mg at 03/04/23 4098   multivitamin with minerals tablet 1 tablet  1 tablet Oral Daily Lewanda Rife, MD   1 tablet at 03/04/23  0911   OLANZapine (ZYPREXA) injection 5 mg  5 mg Intramuscular TID PRN Lauree Chandler, NP       OLANZapine zydis (ZYPREXA) disintegrating tablet 5 mg  5 mg Oral TID PRN Lauree Chandler, NP       tamsulosin [facility]) capsule 0.4 mg  0.4 mg Oral QPC supper Lewanda Rife, MD   0.4 mg at 03/03/23 1713   temazepam (RESTORIL) capsule 15 mg  15 mg Oral QHS Lewanda Rife, MD   15 mg at 03/03/23 2139   traMADol (ULTRAM) tablet 50 mg  50 mg Oral Q6H PRN Lewanda Rife, MD   50 mg at 03/04/23 0910   venlafaxine XR (EFFEXOR-XR) 24 hr capsule 150 mg  150 mg Oral QPC breakfast [name], MD   150 mg at 03/04/23 1191    Lab Results:  No results found for this or any previous visit (from the past 48 hours).    Blood Alcohol level:  Lab Results  Component Value Date   ETH <10 02/12/2023   ETH <10 12/18/2022    Metabolic Disorder Labs: Lab Results  Component Value Date   HGBA1C 5.5 12/29/2022   MPG 111.15 12/29/2022   MPG 111 04/16/2022   No results found for: "PROLACTIN" Lab Results  Component Value Date   CHOL 208 (H) 12/29/2022   TRIG 145 12/29/2022   HDL 33 (L) 12/29/2022   CHOLHDL 6.3 12/29/2022   VLDL 29 12/29/2022   LDLCALC 146 (H) 12/29/2022   LDLCALC 125 (H) 04/16/2022      Psychiatric Specialty Exam:  Psychiatric Specialty Exam:   Presentation  General Appearance:  Appropriate for Environment; Casual   Eye Contact: Fair   Speech: Clear and Coherent   Speech Volume: Normal       Mood and Affect  Mood: " Fine"   Affect: Less Constricted    Thought Process  Thought Processes: Coherent   Descriptions of Associations:Intact   Orientation:Full (Time,  Place and Person)   Thought Content:Improving   Hallucinations: auditory hallucinations, reports voices are quieter and less intense, probably at baseline  Ideas of Reference:None   Suicidal Thoughts:Positive SI, although less intense and less frequent   Homicidal  Thoughts:Denies     Sensorium  Memory: Recent Fair; Immediate Fair; Remote Fair   Judgment: Limited   Insight: Shallow     Executive Functions  Concentration: Fair   Attention Span: Fair   Recall: [employer] of Knowledge: Fair   Language: Fair     Psychomotor Activity  Psychomotor Activity: Decreased   Musculoskeletal: Strength & Muscle Tone: within normal limits Gait & Station: normal Assets  Assets: Manufacturing systems engineer; Desire for Improvement; Physical Health   Physical Exam: Physical Exam Vitals and nursing note reviewed.  HENT:     Head: Normocephalic.     Nose: Nose normal.     Mouth/Throat:     Mouth: Mucous membranes are moist.  Eyes:     Pupils: Pupils are equal, round, and reactive to light.  Cardiovascular:     Rate and Rhythm: Normal rate.  Pulmonary:     Breath sounds: Normal breath sounds.  Abdominal:     General: Bowel sounds are normal.  Skin:    General: Skin is warm.  Neurological:     General: No focal deficit present.     Mental Status: He is alert.    Review of Systems  Constitutional: Negative.   HENT: Negative.    Eyes: Negative.   Respiratory: Negative.    Cardiovascular: Negative.   Gastrointestinal: Negative.   Skin: Negative.   Neurological: Negative.    Blood pressure 110/83, pulse 81, temperature (!) 97.4 F (36.3 C), resp. rate 17, SpO2 99%. There is no height or weight on file to calculate BMI.  Diagnosis: Principal Problem:   Bipolar disorder, current episode depressed, severe, with psychotic features (HCC) Active Problems:   Cocaine use   Rhabdomyolysis   Seizures (HCC)   PLAN: Safety and Monitoring:  -- Voluntary admission to inpatient psychiatric unit for safety, stabilization and treatment  -- Daily contact with patient to assess and evaluate symptoms and progress in treatment  -- Patient's case to be discussed in multi-disciplinary team meeting  -- Observation Level : q15 minute  checks  -- Vital signs:  q12 hours  -- Precautions: suicide, elopement, and assault -- Encouraged patient to participate in unit milieu and in scheduled group therapies  2. Psychiatric Diagnoses and Treatment:    -  increased the dose to 750 mg po BID, 02/28/23,  -Continue Latuda to 80 mg daily.  Dose increased 02/18/2023 -Melatonin 5 mg at bedtime for insomnia - Effexor XR 150 mg to help with depression  - Vistaril was discontinued as not effective and added doxepin 25 mg at bedtime for insomnia on 02/24/23;  titrated up to 50mg  at bedtime on 02/25/23   VPA 02/25/23 low at 31   3. Medical Issues Being Addressed:  Metoprolol for SVT and HTN   4. Discharge Planning:   -- Social work and case management to assist with discharge planning and identification of hospital follow-up needs prior to discharge  -- Estimated LOS: 2-3 days days  Lewanda Rife, MD

## 2023-03-04 NOTE — Progress Notes (Signed)
 Patient has been in his room all evening. Pt. Refused the ensure at 2000 and also his melatonin at 2100. He verbalized the melatonin gives him diarrhea.

## 2023-03-04 NOTE — Progress Notes (Addendum)
 Pt remain isolative to his room throughout the evening, continues to endorse passive SI but, not plan or intent while in the hospital. Pt is managed on q 15 min rounds.  03/03/23 2200  Psych Admission Type (Psych Patients Only)  Admission Status Voluntary  Psychosocial Assessment  Patient Complaints Anxiety  Eye Contact Fair  Facial Expression Flat  Affect Sad  Speech Logical/coherent  Interaction Minimal;Isolative  Motor Activity Slow  Appearance/Hygiene In scrubs  Behavior Characteristics Cooperative  Mood Depressed  Thought Process  Coherency WDL  Content WDL  Delusions None reported or observed  Perception WDL  Hallucination None reported or observed  Judgment Impaired  Confusion None  Danger to Self  Current suicidal ideation? Passive  Self-Injurious Behavior No self-injurious ideation or behavior indicators observed or expressed   Agreement Not to Harm Self Yes  Description of Agreement Verbal  Danger to Others  Danger to Others None reported or observed

## 2023-03-05 NOTE — Group Note (Signed)
 Date:  03/04/2023 Time:  08:30PM  Group Topic/Focus:  Self Care:   The focus of this group is to help patients understand the importance of self-care in order to improve or restore emotional, physical, spiritual, interpersonal, and financial health.    Participation Level:  Did Not Attend  Participation Quality:   Did Not Attend  Affect:   Did Not Attend  Cognitive:   Did Not Attend  Insight: None  Engagement in Group:  None  Modes of Intervention:  Confrontation  Additional Comments:    Garry Heater 03/04/2023, 08:30PM

## 2023-03-05 NOTE — Progress Notes (Signed)
 Patient states that the Carnitor medication + Ensure have given him diarrhea. He refused the daily Carnitor and Ensures today. Imodium 2 mg x 1 dose adm at 1800. At the time of this writing, no further complaints of diarrhea or loose stool.

## 2023-03-05 NOTE — Group Note (Signed)
 Date:  03/05/2023 Time:  10:33 PM  Group Topic/Focus:  Goals Group:   The focus of this group is to help patients establish daily goals to achieve during treatment and discuss how the patient can incorporate goal setting into their daily lives to aide in recovery.    Participation Level:  Did Not Attend  Participation Quality:   Did Not Attend  Affect:   Did Not Attend  Cognitive:   Did Not Attend  Insight: None  Engagement in Group:  None  Modes of Intervention:  Socialization  Additional Comments:    Garry Heater 03/05/2023, 10:33 PM

## 2023-03-05 NOTE — Progress Notes (Signed)
   03/05/23 1000  Psych Admission Type (Psych Patients Only)  Admission Status Voluntary  Psychosocial Assessment  Patient Complaints Anxiety  Eye Contact Fair  Facial Expression Flat  Affect Anxious  Speech Logical/coherent  Interaction Isolative  Motor Activity Slow  Appearance/Hygiene In scrubs  Behavior Characteristics Appropriate to situation  Mood Depressed  Thought Process  Coherency WDL  Content WDL  Delusions None reported or observed  Perception WDL  Hallucination None reported or observed  Judgment Impaired  Confusion None  Danger to Self  Current suicidal ideation? Passive  Description of Suicide Plan none  Self-Injurious Behavior No self-injurious ideation or behavior indicators observed or expressed   Agreement Not to Harm Self Yes  Description of Agreement verbal  Danger to Others  Danger to Others None reported or observed

## 2023-03-05 NOTE — Progress Notes (Signed)
 [facility] MD Progress Note  03/05/2023  [name]  MRN:  [id number] [name] is a 63 y.o. male who was initially medically admitted after presenting to [facility] on 02/12/23 for suicidal thoughts, seizures. Was noted to have elevated CK and admitted to hospitalist service for management of rhabdomyolysis. Patient is transferred to geropsych unit after he is medically cleared.   Subjective:  Chart reviewed, case discussed in multidisciplinary meeting, patient seen during rounds.  Staff reported that patient is more visible on the unit.  He has attended few groups.  No new acute events overnight   During assessment today, he reports" little better" mood.  He continues to report suicide thoughts are close to baseline.  Patient reports that voices are close to baseline as well. Patient was provided with support and reassurance. He is more visible on the unit.  Patient was encouraged to attend groups and work on coping strategies.   Sleep:Improved  Appetite:  Fair  Past Psychiatric History: see h&P Family History:  Family History  Problem Relation Age of Onset   CVA Mother        deceased at age 58   Depression Brother        Died by suicide at age 49   Social History:  Social History   Substance and Sexual Activity  Alcohol Use Not Currently   Comment: rare     Social History   Substance and Sexual Activity  Drug Use Yes   Types: Marijuana, Cocaine   Comment: last use January 2025, cocaine    Social History   Socioeconomic History   Marital status: Single    Spouse name: Not on file   Number of children: Not on file   Years of education: Not on file   Highest education level: Not on file  Occupational History   Not on file  Tobacco Use   Smoking status: Former    Current packs/day: 0.00    Average packs/day: 0.8 packs/day for 20.0 years (15.0 ttl pk-yrs)    Types: Cigarettes    Start date: 05/16/1964    Quit date: 05/16/1984    Years since quitting: 38.8    Smokeless tobacco: Never  Vaping Use   Vaping status: Never Used  Substance and Sexual Activity   Alcohol use: Not Currently    Comment: rare   Drug use: Yes    Types: Marijuana, Cocaine    Comment: last use January 2025, cocaine   Sexual activity: Not Currently  Other Topics Concern   Not on file  Social History Narrative   ** Merged History Encounter **       Social Drivers of Health   Financial Resource Strain: Not on file  Food Insecurity: No Food Insecurity (02/15/2023)   Hunger Vital Sign    Worried About Running Out of Food in the Last Year: Never true    Ran Out of Food in the Last Year: Never true  Recent Concern: Food Insecurity - Food Insecurity Present (12/20/2022)   Hunger Vital Sign    Worried About Running Out of Food in the Last Year: Sometimes true    Ran Out of Food in the Last Year: Sometimes true  Transportation Needs: No Transportation Needs (02/15/2023)   PRAPARE - Administrator, Civil Service (Medical): No    Lack of Transportation (Non-Medical): No  Recent Concern: Transportation Needs - Unmet Transportation Needs (02/14/2023)   PRAPARE - Administrator, Civil Service (Medical): Yes  Lack of Transportation (Non-Medical): Yes  Physical Activity: Not on file  Stress: Not on file  Social Connections: Socially Isolated (02/15/2023)   Social Connection and Isolation Panel [NHANES]    Frequency of Communication with Friends and Family: Once a week    Frequency of Social Gatherings with Friends and Family: Once a week    Attends Religious Services: Never    Database administrator or Organizations: No    Attends Engineer, structural: Never    Marital Status: Never married   Past Medical History:  Past Medical History:  Diagnosis Date   Anxiety    Arthritis    knees and hands   Bipolar 1 disorder, depressed (HCC)    Bipolar disorder (HCC)    Depression    GERD (gastroesophageal reflux disease)    Hepatitis    HEP "C"    History of kidney stones    Hypertension    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 echo 5/17: EF 60-65%, no RWMA, LV dias fxn nl, LA mildly dilated, RV sys fxn nl, PASP nl, moderate sized circumferential pericardial effusion was identified, 2.12 cm around the LV free wall, <1 cm around the RV free wall. Features were not c/w tamponade physiology   PTSD (post-traumatic stress disorder)    Witnessed brother's suicide.   Restless leg syndrome    Seizures (HCC)    Syncope     Past Surgical History:  Procedure Laterality Date   APPENDECTOMY     CYSTOSCOPY WITH URETEROSCOPY AND STENT PLACEMENT     ESOPHAGOGASTRODUODENOSCOPY N/A 01/11/2016   Procedure: ESOPHAGOGASTRODUODENOSCOPY (EGD);  Surgeon: Charlott Rakes, MD;  Location: [facility] ENDOSCOPY;  Service: Endoscopy;  Laterality: N/A;   ESOPHAGOGASTRODUODENOSCOPY N/A 04/09/2020   Procedure: ESOPHAGOGASTRODUODENOSCOPY (EGD);  Surgeon: [name], MD;  Location: [facility] ENDOSCOPY;  Service: Gastroenterology;  Laterality: N/A;   INCISION AND DRAINAGE ABSCESS Left 01/02/2018   Procedure: INCISION AND DRAINAGE LEFT KNEE;  Surgeon: Deeann Saint, MD;  Location: [facility] ORS;  Service: Orthopedics;  Laterality: Left;   JOINT REPLACEMENT Right    TKR   KNEE ARTHROSCOPY Right 06/25/2014   Procedure: ARTHROSCOPY KNEE;  Surgeon: Deeann Saint, MD;  Location: [facility] ORS;  Service: Orthopedics;  Laterality: Right;  partial arthroscopic medial menisectomy   LAPAROSCOPIC APPENDECTOMY N/A 06/02/2021   Procedure: APPENDECTOMY LAPAROSCOPIC;  Surgeon: Campbell Lerner, MD;  Location: [facility] ORS;  Service: General;  Laterality: N/A;   TOTAL KNEE ARTHROPLASTY Right 04/22/2015   Procedure: TOTAL KNEE ARTHROPLASTY;  Surgeon: Deeann Saint, MD;  Location: [facility] ORS;  Service: Orthopedics;  Laterality: Right;   TOTAL KNEE ARTHROPLASTY Left 10/30/2017   Procedure: TOTAL KNEE ARTHROPLASTY;  Surgeon: Deeann Saint, MD;   Location: [facility] ORS;  Service: Orthopedics;  Laterality: Left;   TOTAL KNEE REVISION Left 01/02/2018   Procedure: poly exchange of tibia and patella left knee;  Surgeon: Deeann Saint, MD;  Location: [facility] ORS;  Service: Orthopedics;  Laterality: Left;   UMBILICAL HERNIA REPAIR  06/02/2021   Procedure: HERNIA REPAIR UMBILICAL ADULT;  Surgeon: Campbell Lerner, MD;  Location: [facility] ORS;  Service: General;;    Current Medications: Current Facility-Administered Medications  Medication Dose Route Frequency Provider Last Rate Last Admin   acetaminophen (TYLENOL) tablet 650 mg  650 mg Oral Q6H PRN Lauree Chandler, NP   650 mg at 02/28/23 0900   Or   acetaminophen (TYLENOL) suppository 650 mg  650 mg Rectal Q6H  PRN Lauree Chandler, NP       divalproex (DEPAKOTE) DR tablet 750 mg  750 mg Oral BID Lewanda Rife, MD   750 mg at 03/05/23 0856   doxepin (SINEQUAN) capsule 50 mg  50 mg Oral QHS [name], MD   50 mg at 03/04/23 2131   feeding supplement (ENSURE ENLIVE / ENSURE PLUS) liquid 237 mL  237 mL Oral TID BM Lewanda Rife, MD   237 mL at 03/03/23 1409   hydrOXYzine (ATARAX) tablet 25 mg  25 mg Oral Q6H PRN [name], MD   25 mg at 03/05/23 0856   levOCARNitine (CARNITOR) 1 GM/10ML solution 500 mg  500 mg Oral Daily Lauree Chandler, NP   500 mg at 03/03/23 0801   loperamide (IMODIUM) capsule 2 mg  2 mg Oral PRN Lewanda Rife, MD   2 mg at 03/03/23 1522   lurasidone (LATUDA) tablet 80 mg  80 mg Oral Q supper Lewanda Rife, MD   80 mg at 03/04/23 1707   melatonin tablet 5 mg  5 mg Oral QHS Lewanda Rife, MD   5 mg at 02/21/23 2126   metoprolol succinate (TOPROL-XL) 24 hr tablet 50 mg  50 mg Oral Daily Lewanda Rife, MD   50 mg at 03/05/23 5621   multivitamin with minerals tablet 1 tablet  1 tablet Oral Daily Lewanda Rife, MD   1 tablet at 03/05/23 0856   OLANZapine (ZYPREXA) injection 5 mg  5 mg Intramuscular TID PRN Lauree Chandler, NP        OLANZapine zydis (ZYPREXA) disintegrating tablet 5 mg  5 mg Oral TID PRN Lauree Chandler, NP       tamsulosin [facility]) capsule 0.4 mg  0.4 mg Oral QPC supper Lewanda Rife, MD   0.4 mg at 03/04/23 1707   temazepam (RESTORIL) capsule 15 mg  15 mg Oral QHS Lewanda Rife, MD   15 mg at 03/04/23 2131   traMADol (ULTRAM) tablet 50 mg  50 mg Oral Q6H PRN Lewanda Rife, MD   50 mg at 03/05/23 0856   venlafaxine XR (EFFEXOR-XR) 24 hr capsule 150 mg  150 mg Oral QPC breakfast [name], MD   150 mg at 03/05/23 0856     Blood Alcohol level:  Lab Results  Component Value Date   ETH <10 02/12/2023   ETH <10 12/18/2022    Metabolic Disorder Labs: Lab Results  Component Value Date   HGBA1C 5.5 12/29/2022   MPG 111.15 12/29/2022   MPG 111 04/16/2022   No results found for: "PROLACTIN" Lab Results  Component Value Date   CHOL 208 (H) 12/29/2022   TRIG 145 12/29/2022   HDL 33 (L) 12/29/2022   CHOLHDL 6.3 12/29/2022   VLDL 29 12/29/2022   LDLCALC 146 (H) 12/29/2022   LDLCALC 125 (H) 04/16/2022    Psychiatric Specialty Exam:   Presentation  General Appearance:  Appropriate for Environment; Casual   Eye Contact: Fair   Speech: Clear and Coherent   Speech Volume: Normal       Mood and Affect  Mood: " Little better"   Affect: Less Constricted    Thought Process  Thought Processes: Coherent   Descriptions of Associations:Intact   Orientation:Full (Time, Place and Person)   Thought Content:Improving   Hallucinations: auditory hallucinations, reports voices are at baseline  Ideas of Reference:None   Suicidal Thoughts: Reports SI are "not bad"   Homicidal Thoughts:Denies     Sensorium  Memory: Recent Fair; Immediate  Fair; Remote Fair   Judgment: Improving   Insight: Improving     Executive Functions  Concentration: Fair   Attention Span: Fair   Recall: [employer] of Knowledge: Fair   Language: Fair     Psychomotor  Activity  Psychomotor Activity: Decreased   Musculoskeletal: Strength & Muscle Tone: within normal limits Gait & Station: normal Assets  Assets: Manufacturing systems engineer; Desire for Improvement; Physical Health   Physical Exam: Physical Exam Vitals and nursing note reviewed.  HENT:     Head: Normocephalic.     Nose: Nose normal.     Mouth/Throat:     Mouth: Mucous membranes are moist.  Eyes:     Pupils: Pupils are equal, round, and reactive to light.  Cardiovascular:     Rate and Rhythm: Normal rate.  Pulmonary:     Breath sounds: Normal breath sounds.  Abdominal:     General: Bowel sounds are normal.  Skin:    General: Skin is warm.  Neurological:     General: No focal deficit present.     Mental Status: He is alert.    Review of Systems  Constitutional: Negative.   HENT: Negative.    Eyes: Negative.   Respiratory: Negative.    Cardiovascular: Negative.   Gastrointestinal: Negative.   Skin: Negative.   Neurological: Negative.    Blood pressure 121/88, pulse 80, temperature (!) 97.3 F (36.3 C), resp. rate 18, SpO2 99%. There is no height or weight on file to calculate BMI.  Diagnosis: Principal Problem:   Bipolar disorder, current episode depressed, severe, with psychotic features (HCC) Active Problems:   Cocaine use   Rhabdomyolysis   Seizures (HCC)   PLAN: Safety and Monitoring:  -- Voluntary admission to inpatient psychiatric unit for safety, stabilization and treatment  -- Daily contact with patient to assess and evaluate symptoms and progress in treatment  -- Patient's case to be discussed in multi-disciplinary team meeting  -- Observation Level : q15 minute checks  -- Vital signs:  q12 hours  -- Precautions: suicide, elopement, and assault -- Encouraged patient to participate in unit milieu and in scheduled group therapies  2. Psychiatric Diagnoses and Treatment:    -  increased the dose to 750 mg po BID, 02/28/23,  -Continue Latuda to 80 mg  daily.  Dose increased 02/18/2023 -Melatonin 5 mg at bedtime for insomnia - Effexor XR 150 mg to help with depression  - Vistaril was discontinued as not effective and added doxepin 25 mg at bedtime for insomnia on 02/24/23;  titrated up to 50mg  at bedtime on 02/25/23   VPA level, AST/ ALT tomorrow AM   3. Medical Issues Being Addressed:  Metoprolol for SVT and HTN   4. Discharge Planning:   -- Social work and case management to assist with discharge planning and identification of hospital follow-up needs prior to discharge  -- Estimated LOS: 1-2  days  Lewanda Rife, MD

## 2023-03-05 NOTE — Plan of Care (Signed)
 [name] is a 63 y.o. male patient. No diagnosis found. 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 echo 5/17: EF 60-65%, no RWMA, LV dias fxn nl, LA mildly dilated, RV sys fxn nl, PASP nl, moderate sized circumferential pericardial effusion was identified, 2.12 cm around the LV free wall, <1 cm around the RV free wall. Features were not c/w tamponade physiology   PTSD (post-traumatic stress disorder)    Witnessed brother's suicide.   Restless leg syndrome    Seizures (HCC)    Syncope    Current Facility-Administered Medications  Medication Dose Route Frequency Provider Last Rate Last Admin   acetaminophen (TYLENOL) tablet 650 mg  650 mg Oral Q6H PRN Lauree Chandler, NP   650 mg at 02/28/23 0900   Or   acetaminophen (TYLENOL) suppository 650 mg  650 mg Rectal Q6H PRN Lauree Chandler, NP       divalproex (DEPAKOTE) DR tablet 750 mg  750 mg Oral BID Lewanda Rife, MD   750 mg at 03/04/23 2130   doxepin (SINEQUAN) capsule 50 mg  50 mg Oral QHS [name], MD   50 mg at 03/04/23 2131   feeding supplement (ENSURE ENLIVE / ENSURE PLUS) liquid 237 mL  237 mL Oral TID BM Lewanda Rife, MD   237 mL at 03/03/23 1409   hydrOXYzine (ATARAX) tablet 25 mg  25 mg Oral Q6H PRN [name], MD   25 mg at 03/02/23 1624   levOCARNitine (CARNITOR) 1 GM/10ML solution 500 mg  500 mg Oral Daily Lauree Chandler, NP   500 mg at 03/03/23 0801   loperamide (IMODIUM) capsule 2 mg  2 mg Oral PRN Lewanda Rife, MD   2 mg at 03/03/23 1522   lurasidone (LATUDA) tablet 80 mg  80 mg Oral Q supper Lewanda Rife, MD   80 mg at 03/04/23 1707    melatonin tablet 5 mg  5 mg Oral QHS Lewanda Rife, MD   5 mg at 02/21/23 2126   metoprolol succinate (TOPROL-XL) 24 hr tablet 50 mg  50 mg Oral Daily Lewanda Rife, MD   50 mg at 03/04/23 2956   multivitamin with minerals tablet 1 tablet  1 tablet Oral Daily Lewanda Rife, MD   1 tablet at 03/04/23 0911   OLANZapine (ZYPREXA) injection 5 mg  5 mg Intramuscular TID PRN Lauree Chandler, NP       OLANZapine zydis (ZYPREXA) disintegrating tablet 5 mg  5 mg Oral TID PRN Lauree Chandler, NP       tamsulosin [facility]) capsule 0.4 mg  0.4 mg Oral QPC supper Lewanda Rife, MD   0.4 mg at 03/04/23 1707   temazepam (RESTORIL) capsule 15 mg  15 mg Oral QHS Lewanda Rife, MD   15 mg at 03/04/23 2131   traMADol (ULTRAM) tablet 50 mg  50 mg Oral Q6H PRN Lewanda Rife, MD   50 mg at 03/04/23 1707   venlafaxine XR (EFFEXOR-XR) 24 hr capsule 150 mg  150 mg Oral QPC breakfast [name], MD  150 mg at 03/04/23 4132   No Known Allergies Principal Problem:   Bipolar disorder, current episode depressed, severe, with psychotic features [facility]) Active Problems:   Cocaine use   Rhabdomyolysis   Seizures (HCC)  Blood pressure 98/73, pulse 91, temperature 98.2 F (36.8 C), resp. rate 17, SpO2 97%.  Subjective Objective Assessment & Plan  [name] 03/05/2023

## 2023-03-05 NOTE — Plan of Care (Signed)
  Problem: Activity: Goal: Interest or engagement in leisure activities will improve Outcome: Not Progressing Goal: Imbalance in normal sleep/wake cycle will improve Outcome: Not Progressing

## 2023-03-06 LAB — ALT: ALT: 13 U/L (ref 0–44)

## 2023-03-06 LAB — VALPROIC ACID LEVEL: Valproic Acid Lvl: 59 ug/mL (ref 50.0–100.0)

## 2023-03-06 LAB — AST: AST: 15 U/L (ref 15–41)

## 2023-03-06 NOTE — Progress Notes (Signed)
 [facility] MD Progress Note  03/06/2023 2:14 PM Jaycen Colon Easterly  MRN:  [id number] [name] is a 63 y.o. male who was initially medically admitted after presenting to [facility] on 02/12/23 for suicidal thoughts, seizures. Was noted to have elevated CK and admitted to hospitalist service for management of rhabdomyolysis. Patient is transferred to geropsych unit after he is medically cleared.   Subjective:  Chart reviewed, case discussed in multidisciplinary meeting, patient seen during rounds.  Today on interview patient is noted to be resting in bed.  He reports that he had good few days.  He reports that his depression and anxiety are at his baseline where he can manage them.  He denies having any active SI/HI/intent/plan.  He is taking his medications with no reported side effects.  He remains future oriented.  He reports after he goes back home he is going to visit his girlfriend who is currently in nursing home.  He talks about the relationship being 61 year old and her being his only support.  He is willing to participate in outpatient mental health services. Sleep: Fair  Appetite:  Fair  Past Psychiatric History: see h&P Family History:  Family History  Problem Relation Age of Onset   CVA Mother        deceased at age 83   Depression Brother        Died by suicide at age 69   Social History:  Social History   Substance and Sexual Activity  Alcohol Use Not Currently   Comment: rare     Social History   Substance and Sexual Activity  Drug Use Yes   Types: Marijuana, Cocaine   Comment: last use January 2025, cocaine    Social History   Socioeconomic History   Marital status: Single    Spouse name: Not on file   Number of children: Not on file   Years of education: Not on file   Highest education level: Not on file  Occupational History   Not on file  Tobacco Use   Smoking status: Former    Current packs/day: 0.00    Average packs/day: 0.8 packs/day for 20.0 years (15.0  ttl pk-yrs)    Types: Cigarettes    Start date: 05/16/1964    Quit date: 05/16/1984    Years since quitting: 38.8   Smokeless tobacco: Never  Vaping Use   Vaping status: Never Used  Substance and Sexual Activity   Alcohol use: Not Currently    Comment: rare   Drug use: Yes    Types: Marijuana, Cocaine    Comment: last use January 2025, cocaine   Sexual activity: Not Currently  Other Topics Concern   Not on file  Social History Narrative   ** Merged History Encounter **       Social Drivers of Health   Financial Resource Strain: Not on file  Food Insecurity: No Food Insecurity (02/15/2023)   Hunger Vital Sign    Worried About Running Out of Food in the Last Year: Never true    Ran Out of Food in the Last Year: Never true  Recent Concern: Food Insecurity - Food Insecurity Present (12/20/2022)   Hunger Vital Sign    Worried About Running Out of Food in the Last Year: Sometimes true    Ran Out of Food in the Last Year: Sometimes true  Transportation Needs: No Transportation Needs (02/15/2023)   PRAPARE - Transportation    Lack of Transportation (Medical): No    Lack of  Transportation (Non-Medical): No  Recent Concern: Transportation Needs - Unmet Transportation Needs (02/14/2023)   PRAPARE - Administrator, Civil Service (Medical): Yes    Lack of Transportation (Non-Medical): Yes  Physical Activity: Not on file  Stress: Not on file  Social Connections: Socially Isolated (02/15/2023)   Social Connection and Isolation Panel [NHANES]    Frequency of Communication with Friends and Family: Once a week    Frequency of Social Gatherings with Friends and Family: Once a week    Attends Religious Services: Never    Database administrator or Organizations: No    Attends Engineer, structural: Never    Marital Status: Never married   Past Medical History:  Past Medical History:  Diagnosis Date   Anxiety    Arthritis    knees and hands   Bipolar 1 disorder, depressed  (HCC)    Bipolar disorder (HCC)    Depression    GERD (gastroesophageal reflux disease)    Hepatitis    HEP "C"   History of kidney stones    Hypertension    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 echo 5/17: EF 60-65%, no RWMA, LV dias fxn nl, LA mildly dilated, RV sys fxn nl, PASP nl, moderate sized circumferential pericardial effusion was identified, 2.12 cm around the LV free wall, <1 cm around the RV free wall. Features were not c/w tamponade physiology   PTSD (post-traumatic stress disorder)    Witnessed brother's suicide.   Restless leg syndrome    Seizures (HCC)    Syncope     Past Surgical History:  Procedure Laterality Date   APPENDECTOMY     CYSTOSCOPY WITH URETEROSCOPY AND STENT PLACEMENT     ESOPHAGOGASTRODUODENOSCOPY N/A 01/11/2016   Procedure: ESOPHAGOGASTRODUODENOSCOPY (EGD);  Surgeon: Charlott Rakes, MD;  Location: [facility] ENDOSCOPY;  Service: Endoscopy;  Laterality: N/A;   ESOPHAGOGASTRODUODENOSCOPY N/A 04/09/2020   Procedure: ESOPHAGOGASTRODUODENOSCOPY (EGD);  Surgeon: [name], MD;  Location: [facility] ENDOSCOPY;  Service: Gastroenterology;  Laterality: N/A;   INCISION AND DRAINAGE ABSCESS Left 01/02/2018   Procedure: INCISION AND DRAINAGE LEFT KNEE;  Surgeon: Deeann Saint, MD;  Location: [facility] ORS;  Service: Orthopedics;  Laterality: Left;   JOINT REPLACEMENT Right    TKR   KNEE ARTHROSCOPY Right 06/25/2014   Procedure: ARTHROSCOPY KNEE;  Surgeon: Deeann Saint, MD;  Location: [facility] ORS;  Service: Orthopedics;  Laterality: Right;  partial arthroscopic medial menisectomy   LAPAROSCOPIC APPENDECTOMY N/A 06/02/2021   Procedure: APPENDECTOMY LAPAROSCOPIC;  Surgeon: Campbell Lerner, MD;  Location: [facility] ORS;  Service: General;  Laterality: N/A;   TOTAL KNEE ARTHROPLASTY Right 04/22/2015   Procedure: TOTAL KNEE ARTHROPLASTY;  Surgeon: Deeann Saint, MD;  Location: [facility] ORS;  Service: Orthopedics;  Laterality:  Right;   TOTAL KNEE ARTHROPLASTY Left 10/30/2017   Procedure: TOTAL KNEE ARTHROPLASTY;  Surgeon: Deeann Saint, MD;  Location: [facility] ORS;  Service: Orthopedics;  Laterality: Left;   TOTAL KNEE REVISION Left 01/02/2018   Procedure: poly exchange of tibia and patella left knee;  Surgeon: Deeann Saint, MD;  Location: [facility] ORS;  Service: Orthopedics;  Laterality: Left;   UMBILICAL HERNIA REPAIR  06/02/2021   Procedure: HERNIA REPAIR UMBILICAL ADULT;  Surgeon: Campbell Lerner, MD;  Location: [facility] ORS;  Service: General;;    Current Medications: Current Facility-Administered Medications  Medication Dose Route Frequency Provider Last Rate Last Admin   acetaminophen (TYLENOL) tablet 650 mg  650 mg  Oral Q6H PRN Lauree Chandler, NP   650 mg at 02/28/23 0900   Or   acetaminophen (TYLENOL) suppository 650 mg  650 mg Rectal Q6H PRN Lauree Chandler, NP       divalproex (DEPAKOTE) DR tablet 750 mg  750 mg Oral BID Lewanda Rife, MD   750 mg at 03/06/23 0838   doxepin (SINEQUAN) capsule 50 mg  50 mg Oral QHS [name], MD   50 mg at 03/05/23 2100   feeding supplement (ENSURE ENLIVE / ENSURE PLUS) liquid 237 mL  237 mL Oral TID BM Lewanda Rife, MD   237 mL at 03/06/23 1350   hydrOXYzine (ATARAX) tablet 25 mg  25 mg Oral Q6H PRN [name], MD   25 mg at 03/06/23 4098   levOCARNitine (CARNITOR) 1 GM/10ML solution 500 mg  500 mg Oral Daily Lauree Chandler, NP   500 mg at 03/03/23 1191   loperamide (IMODIUM) capsule 2 mg  2 mg Oral PRN Lewanda Rife, MD   2 mg at 03/06/23 1400   lurasidone (LATUDA) tablet 80 mg  80 mg Oral Q supper Lewanda Rife, MD   80 mg at 03/05/23 1618   melatonin tablet 5 mg  5 mg Oral QHS Lewanda Rife, MD   5 mg at 02/21/23 2126   metoprolol succinate (TOPROL-XL) 24 hr tablet 50 mg  50 mg Oral Daily Lewanda Rife, MD   50 mg at 03/06/23 4782   multivitamin with minerals tablet 1 tablet  1 tablet Oral Daily Lewanda Rife, MD   1  tablet at 03/06/23 0838   OLANZapine (ZYPREXA) injection 5 mg  5 mg Intramuscular TID PRN Lauree Chandler, NP       OLANZapine zydis (ZYPREXA) disintegrating tablet 5 mg  5 mg Oral TID PRN Lauree Chandler, NP       tamsulosin [facility]) capsule 0.4 mg  0.4 mg Oral QPC supper Lewanda Rife, MD   0.4 mg at 03/05/23 1619   temazepam (RESTORIL) capsule 15 mg  15 mg Oral QHS Lewanda Rife, MD   15 mg at 03/05/23 2100   traMADol (ULTRAM) tablet 50 mg  50 mg Oral Q6H PRN Lewanda Rife, MD   50 mg at 03/06/23 9562   venlafaxine XR (EFFEXOR-XR) 24 hr capsule 150 mg  150 mg Oral QPC breakfast [name], MD   150 mg at 03/06/23 1308    Lab Results:  Results for orders placed or performed during the hospital encounter of 02/15/23 (from the past 48 hours)  Valproic acid level     Status: None   Collection Time: 03/06/23  7:06 AM  Result Value Ref Range   Valproic Acid Lvl 59 50.0 - 100.0 ug/mL    Comment: Performed at [facility], [address] [place], [place] [address]  AST     Status: None   Collection Time: 03/06/23  7:06 AM  Result Value Ref Range   AST 15 15 - 41 U/L    Comment: Performed at [facility], [address] [place], [place] [address]  ALT     Status: None   Collection Time: 03/06/23  7:06 AM  Result Value Ref Range   ALT 13 0 - 44 U/L    Comment: Performed at [facility], [address] [place], [place] [address]     Blood Alcohol level:  Lab Results  Component Value Date   ETH <10 02/12/2023   ETH <10 12/18/2022  Metabolic Disorder Labs: Lab Results  Component Value Date   HGBA1C 5.5 12/29/2022   MPG 111.15 12/29/2022   MPG 111 04/16/2022   No results found for: "PROLACTIN" Lab Results  Component Value Date   CHOL 208 (H) 12/29/2022   TRIG 145 12/29/2022   HDL 33 (L) 12/29/2022   CHOLHDL 6.3 12/29/2022   VLDL 29 12/29/2022   LDLCALC 146 (H) 12/29/2022   LDLCALC 125 (H) 04/16/2022    Physical  Findings: AIMS:  , ,  ,  ,    CIWA:    COWS:      Psychiatric Specialty Exam:  Presentation  General Appearance:  Appropriate for Environment; Casual  Eye Contact: Fair  Speech: Clear and Coherent  Speech Volume: Normal    Mood and Affect  Mood: Euthymic  Affect: Congruent   Thought Process  Thought Processes: Coherent  Descriptions of Associations:Intact  Orientation:Full (Time, Place and Person)  Thought Content:Logical  Hallucinations:Hallucinations: None  Ideas of Reference:None  Suicidal Thoughts:Suicidal Thoughts: No  Homicidal Thoughts:Homicidal Thoughts: No   Sensorium  Memory: Immediate Fair; Recent Fair; Remote Fair  Judgment: Fair  Insight: Fair   Art therapist  Concentration: Fair  Attention Span: Fair  Recall: [employer] of Knowledge: Fair  Language: Fair   Psychomotor Activity  Psychomotor Activity: Psychomotor Activity: Normal  Musculoskeletal: Strength & Muscle Tone: within normal limits Gait & Station: normal Assets  Assets: Manufacturing systems engineer; Desire for Improvement; Physical Health    Physical Exam: Physical Exam Vitals and nursing note reviewed.  HENT:     Head: Normocephalic.     Nose: Nose normal.     Mouth/Throat:     Mouth: Mucous membranes are moist.  Eyes:     Pupils: Pupils are equal, round, and reactive to light.  Cardiovascular:     Rate and Rhythm: Normal rate.  Pulmonary:     Breath sounds: Normal breath sounds.  Abdominal:     General: Bowel sounds are normal.  Skin:    General: Skin is warm.  Neurological:     General: No focal deficit present.     Mental Status: He is alert.    Review of Systems  Constitutional: Negative.   HENT: Negative.    Eyes: Negative.   Respiratory: Negative.    Cardiovascular: Negative.   Gastrointestinal: Negative.   Skin: Negative.   Neurological: Negative.    Blood pressure (!) 119/92, pulse 82, temperature (!) 97.3 F (36.3  C), resp. rate 14, SpO2 100%. There is no height or weight on file to calculate BMI.  Diagnosis: Principal Problem:   Bipolar disorder, current episode depressed, severe, with psychotic features (HCC) Active Problems:   Cocaine use   Rhabdomyolysis   Seizures (HCC)   PLAN: Safety and Monitoring:  -- Voluntary admission to inpatient psychiatric unit for safety, stabilization and treatment  -- Daily contact with patient to assess and evaluate symptoms and progress in treatment  -- Patient's case to be discussed in multi-disciplinary team meeting  -- Observation Level : q15 minute checks  -- Vital signs:  q12 hours  -- Precautions: suicide, elopement, and assault -- Encouraged patient to participate in unit milieu and in scheduled group therapies  2. Psychiatric Diagnoses and Treatment:    Depakote 750mg  by mouth twice daily for mood stabilization and seizure d/o-  Depakote levels -59 -Continue Latuda to 80 mg daily.  Dose increased 02/18/2023 -Melatonin 5 mg at bedtime for insomnia - Effexor XR 150 mg to help with depression  Doxepin 50 mg at bedtime for insomnia  - Vistaril was discontinued as not effective     3. Medical Issues Being Addressed:  Metoprolol for SVT and HTN   Ck down to 226, WNL. Patient encouraged to drink water and keep himself hydrated  4. Discharge Planning:   -- Social work and case management to assist with discharge planning and identification of hospital follow-up needs prior to discharge  -- Estimated LOS: 3-4 days  [name], MD 03/06/2023, 2:14 PM

## 2023-03-06 NOTE — Group Note (Signed)
 Recreation Therapy Group Note   Group Topic:General Recreation  Group Date: 03/06/2023 Start Time: 1400 End Time: 1500 Facilitators: Rosina Lowenstein, LRT, CTRS Location: Courtyard  Group Description: Outdoor Recreation. Patients had the option to play corn hole, ring toss, bowling or listening to music while outside in the courtyard getting fresh air and sunlight. LRT and patients discussed things that they enjoy doing in their free time outside of the hospital. LRT encouraged patients to drink water after being active and getting their heart rate up.   Goal Area(s) Addressed: Patient will identify leisure interests.  Patient will practice healthy decision making. Patient will engage in recreation activity.   Affect/Mood: N/A   Participation Level: Did not attend    Clinical Observations/Individualized Feedback: Patient did not attend group.   Plan: Continue to engage patient in RT group sessions 2-3x/week.   Rosina Lowenstein, LRT, CTRS 03/06/2023 4:51 PM

## 2023-03-06 NOTE — Plan of Care (Incomplete Revision)
D: Pt alert and oriented. Pt reports experiencing anxiety/depression at this time. Pt reports experiencing 8/10 neck pain at this time, prn pain management medication given. Pt denies experiencing any HI, or AVH at this time however, endorses SI w/o a plan while here. Pt contracts for safety and will notify staff if anything changes.   A: Scheduled medications administered to pt, per MD orders. Support and encouragement provided. Frequent verbal contact made. Routine safety checks conducted q15 minutes.   R: No adverse drug reactions noted. Pt verbally contracts for safety at this time. Pt compliant with medications. Pt interacts minimally with others on the unit. Pt remains safe at this time. Plan of care ongoing.  Pt refused second ensure scheduled for 1400 and morning levocarnitine. Pt states that he is having diarrhea and believes them to be the cause, MD has been notified and is aware. Pt was given imodium for the diarrhea and it has seemed to help.   Problem: Education: Goal: Utilization of techniques to improve thought processes will improve Outcome: Not Progressing   Problem: Coping: Goal: Coping ability will improve Outcome: Not Progressing

## 2023-03-06 NOTE — Group Note (Signed)
 Date:  03/06/2023 Time:  9:41 PM  Group Topic/Focus:  Wrap-Up Group:   The focus of this group is to help patients review their daily goal of treatment and discuss progress on daily workbooks.    Participation Level:  Did Not Attend  Participation Quality:      Affect:      Cognitive:      Insight: None  Engagement in Group:      Modes of Intervention:      Additional Comments:    Maeola Harman 03/06/2023, 9:41 PM

## 2023-03-06 NOTE — Plan of Care (Signed)
   Problem: Education: Goal: Utilization of techniques to improve thought processes will improve Outcome: Progressing Goal: Knowledge of the prescribed therapeutic regimen will improve Outcome: Progressing

## 2023-03-06 NOTE — Plan of Care (Addendum)
 D: Pt alert and oriented. Pt reports experiencing anxiety/depression at this time. Pt reports experiencing 8/10 neck pain at this time, prn pain management medication given. Pt denies experiencing any HI, or AVH at this time however, endorses SI w/o a plan while here. Pt contracts for safety and will notify staff if anything changes.   A: Scheduled medications administered to pt, per MD orders. Support and encouragement provided. Frequent verbal contact made. Routine safety checks conducted q15 minutes.   R: No adverse drug reactions noted. Pt verbally contracts for safety at this time. Pt compliant with medications. Pt interacts minimally with others on the unit. Pt remains safe at this time. Plan of care ongoing.  Pt refused second ensure scheduled for 1400 and morning levocarnitine. Pt states that he is having diarrhea and believes them to be the cause, MD has been notified and is aware. Pt was given imodium for the diarrhea and it has seemed to help.   Problem: Education: Goal: Utilization of techniques to improve thought processes will improve Outcome: Not Progressing   Problem: Coping: Goal: Coping ability will improve Outcome: Not Progressing

## 2023-03-07 MED ORDER — TRAMADOL HCL 50 MG PO TABS: 50.0000 mg | ORAL_TABLET | Freq: Four times a day (QID) | ORAL | 0 refills | Status: DC | PRN

## 2023-03-07 MED ORDER — METOPROLOL SUCCINATE ER 50 MG PO TB24: 50.0000 mg | ORAL_TABLET | Freq: Every day | ORAL | 0 refills | Status: DC

## 2023-03-07 MED ORDER — MELATONIN 5 MG PO TABS: 5.0000 mg | ORAL_TABLET | Freq: Every day | ORAL | 0 refills | Status: DC

## 2023-03-07 MED ORDER — VENLAFAXINE HCL ER 150 MG PO CP24: 150.0000 mg | ORAL_CAPSULE | Freq: Every day | ORAL | 0 refills | Status: DC

## 2023-03-07 MED ORDER — LURASIDONE HCL 20 MG PO TABS
80.0000 mg | ORAL_TABLET | Freq: Every day | ORAL | 0 refills | Status: DC
Start: 2023-03-07 — End: 2023-06-08

## 2023-03-07 MED ORDER — ENSURE ENLIVE PO LIQD: 237.0000 mL | Freq: Three times a day (TID) | ORAL | 12 refills | Status: DC

## 2023-03-07 MED ORDER — ADULT MULTIVITAMIN W/MINERALS CH: 1.0000 | ORAL_TABLET | Freq: Every day | ORAL | 0 refills | Status: DC

## 2023-03-07 MED ORDER — DOXEPIN HCL 50 MG PO CAPS: 50.0000 mg | ORAL_CAPSULE | Freq: Every day | ORAL | 0 refills | Status: DC

## 2023-03-07 MED ORDER — DIVALPROEX SODIUM 250 MG PO DR TAB: 750.0000 mg | DELAYED_RELEASE_TABLET | Freq: Two times a day (BID) | ORAL | 0 refills | Status: DC

## 2023-03-07 NOTE — Discharge Summary (Signed)
 Physician Discharge Summary Note  Patient:  [name] is an 63 y.o., male MRN:  [id number] DOB:  1960/10/31 Patient phone:  [phone] (home)  Patient address:   [address] [place] [address],    Date of Admission:  02/15/2023 Date of Discharge: 03/07/23  Reason for Admission:  [name] is a 63 y.o. male who was initially medically admitted after presenting to [facility] on 02/12/23 for suicidal thoughts, seizures. Was noted to have elevated CK and admitted to hospitalist service for management of rhabdomyolysis. Patient is transferred to geropsych unit after he is medically cleared.   Principal Problem: Bipolar disorder, current episode depressed, severe, with psychotic features [facility]) Discharge Diagnoses: Principal Problem:   Bipolar disorder, current episode depressed, severe, with psychotic features (HCC) Active Problems:   Cocaine use   Rhabdomyolysis   Seizures (HCC)   Past Psychiatric History: see h&p  Family Psychiatric  History: see h&p Social History:  Social History   Substance and Sexual Activity  Alcohol Use Not Currently   Comment: rare     Social History   Substance and Sexual Activity  Drug Use Yes   Types: Marijuana, Cocaine   Comment: last use January 2025, cocaine    Social History   Socioeconomic History   Marital status: Single    Spouse name: Not on file   Number of children: Not on file   Years of education: Not on file   Highest education level: Not on file  Occupational History   Not on file  Tobacco Use   Smoking status: Former    Current packs/day: 0.00    Average packs/day: 0.8 packs/day for 20.0 years (15.0 ttl pk-yrs)    Types: Cigarettes    Start date: 05/16/1964    Quit date: 05/16/1984    Years since quitting: 38.8   Smokeless tobacco: Never  Vaping Use   Vaping status: Never Used  Substance and Sexual Activity   Alcohol use: Not Currently    Comment: rare   Drug use: Yes    Types: Marijuana, Cocaine     Comment: last use January 2025, cocaine   Sexual activity: Not Currently  Other Topics Concern   Not on file  Social History Narrative   ** Merged History Encounter **       Social Drivers of Health   Financial Resource Strain: Not on file  Food Insecurity: No Food Insecurity (02/15/2023)   Hunger Vital Sign    Worried About Running Out of Food in the Last Year: Never true    Ran Out of Food in the Last Year: Never true  Recent Concern: Food Insecurity - Food Insecurity Present (12/20/2022)   Hunger Vital Sign    Worried About Running Out of Food in the Last Year: Sometimes true    Ran Out of Food in the Last Year: Sometimes true  Transportation Needs: No Transportation Needs (02/15/2023)   PRAPARE - Administrator, Civil Service (Medical): No    Lack of Transportation (Non-Medical): No  Recent Concern: Transportation Needs - Unmet Transportation Needs (02/14/2023)   PRAPARE - Administrator, Civil Service (Medical): Yes    Lack of Transportation (Non-Medical): Yes  Physical Activity: Not on file  Stress: Not on file  Social Connections: Socially Isolated (02/15/2023)   Social Connection and Isolation Panel [NHANES]    Frequency of Communication with Friends and Family: Once a week    Frequency of Social Gatherings with Friends and  Family: Once a week    Attends Religious Services: Never    Database administrator or Organizations: No    Attends Engineer, structural: Never    Marital Status: Never married   Past Medical History:  Past Medical History:  Diagnosis Date   Anxiety    Arthritis    knees and hands   Bipolar 1 disorder, depressed (HCC)    Bipolar disorder (HCC)    Depression    GERD (gastroesophageal reflux disease)    Hepatitis    HEP "C"   History of kidney stones    Hypertension    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 echo 5/17: EF 60-65%, no RWMA, LV dias fxn nl,  LA mildly dilated, RV sys fxn nl, PASP nl, moderate sized circumferential pericardial effusion was identified, 2.12 cm around the LV free wall, <1 cm around the RV free wall. Features were not c/w tamponade physiology   PTSD (post-traumatic stress disorder)    Witnessed brother's suicide.   Restless leg syndrome    Seizures (HCC)    Syncope     Past Surgical History:  Procedure Laterality Date   APPENDECTOMY     CYSTOSCOPY WITH URETEROSCOPY AND STENT PLACEMENT     ESOPHAGOGASTRODUODENOSCOPY N/A 01/11/2016   Procedure: ESOPHAGOGASTRODUODENOSCOPY (EGD);  Surgeon: Charlott Rakes, MD;  Location: [facility] ENDOSCOPY;  Service: Endoscopy;  Laterality: N/A;   ESOPHAGOGASTRODUODENOSCOPY N/A 04/09/2020   Procedure: ESOPHAGOGASTRODUODENOSCOPY (EGD);  Surgeon: [name], MD;  Location: [facility] ENDOSCOPY;  Service: Gastroenterology;  Laterality: N/A;   INCISION AND DRAINAGE ABSCESS Left 01/02/2018   Procedure: INCISION AND DRAINAGE LEFT KNEE;  Surgeon: Deeann Saint, MD;  Location: [facility] ORS;  Service: Orthopedics;  Laterality: Left;   JOINT REPLACEMENT Right    TKR   KNEE ARTHROSCOPY Right 06/25/2014   Procedure: ARTHROSCOPY KNEE;  Surgeon: Deeann Saint, MD;  Location: [facility] ORS;  Service: Orthopedics;  Laterality: Right;  partial arthroscopic medial menisectomy   LAPAROSCOPIC APPENDECTOMY N/A 06/02/2021   Procedure: APPENDECTOMY LAPAROSCOPIC;  Surgeon: Campbell Lerner, MD;  Location: [facility] ORS;  Service: General;  Laterality: N/A;   TOTAL KNEE ARTHROPLASTY Right 04/22/2015   Procedure: TOTAL KNEE ARTHROPLASTY;  Surgeon: Deeann Saint, MD;  Location: [facility] ORS;  Service: Orthopedics;  Laterality: Right;   TOTAL KNEE ARTHROPLASTY Left 10/30/2017   Procedure: TOTAL KNEE ARTHROPLASTY;  Surgeon: Deeann Saint, MD;  Location: [facility] ORS;  Service: Orthopedics;  Laterality: Left;   TOTAL KNEE REVISION Left 01/02/2018   Procedure: poly exchange of tibia and patella left knee;  Surgeon: Deeann Saint, MD;   Location: [facility] ORS;  Service: Orthopedics;  Laterality: Left;   UMBILICAL HERNIA REPAIR  06/02/2021   Procedure: HERNIA REPAIR UMBILICAL ADULT;  Surgeon: Campbell Lerner, MD;  Location: [facility] ORS;  Service: General;;   Family History:  Family History  Problem Relation Age of Onset   CVA Mother        deceased at age 62   Depression Brother        Died by suicide at age 76    Hospital Course:  [name] is a 63 y.o. male who was initially medically admitted after presenting to [facility] on 02/12/23 for suicidal thoughts, seizures. Was noted to have elevated CK and admitted to hospitalist service for management of rhabdomyolysis. Patient is transferred to geropsych unit after he is medically cleared.  On admission multidisciplinary team approach was offered.  Medication  management, group/milieu therapy was offered.  Patient room has to be locked out initially to motivate him to attend the groups.  He eventually participated in most of the groups.  He had no behavioral problems.  Throughout his stay he has not displayed any self harming behaviors.  With adjustment of his medications he started doing well emotionally.  His Depakote was adjusted to 750 mg twice daily which improved his mood.  His nighttime medications temazepam was continued but hydroxyzine were replaced with doxepin 50 mg that helped him with better sleep.  His Effexor XR was adjusted to 150 mg daily which helped him with his anxiety and depression.  Patient tolerated this medication adjustments fairly well.  On the day of discharge he consistently denied SI/HI/intent/plan.  He denies auditory/visual hallucinations.  He has fair appetite and sleep.  He remains future oriented and is willing to participate in outpatient mental health services. Physical Findings: AIMS:  , ,  ,  ,    CIWA:    COWS:        Psychiatric Specialty Exam:  Presentation  General Appearance:  Appropriate for Environment; Casual  Eye  Contact: Fair  Speech: Clear and Coherent  Speech Volume: Normal    Mood and Affect  Mood: Euthymic  Affect: Appropriate   Thought Process  Thought Processes: Coherent  Descriptions of Associations:Intact  Orientation:Full (Time, Place and Person)  Thought Content:Logical  Hallucinations:Hallucinations: None  Ideas of Reference:None  Suicidal Thoughts:Suicidal Thoughts: No  Homicidal Thoughts:Homicidal Thoughts: No   Sensorium  Memory: Immediate Fair; Recent Fair; Remote Fair  Judgment: Fair  Insight: Fair   Art therapist  Concentration: Fair  Attention Span: Fair  Recall: [employer] of Knowledge: Fair  Language: Fair   Psychomotor Activity  Psychomotor Activity: Psychomotor Activity: Normal  Musculoskeletal: Strength & Muscle Tone: within normal limits Gait & Station: normal Assets  Assets: Manufacturing systems engineer; Desire for Improvement; Physical Health   Sleep  Sleep: Sleep: Fair    Physical Exam: Physical Exam ROS Blood pressure 105/83, pulse 81, temperature (!) 97.4 F (36.3 C), resp. rate 16, SpO2 100%. There is no height or weight on file to calculate BMI.   Social History   Tobacco Use  Smoking Status Former   Current packs/day: 0.00   Average packs/day: 0.8 packs/day for 20.0 years (15.0 ttl pk-yrs)   Types: Cigarettes   Start date: 05/16/1964   Quit date: 05/16/1984   Years since quitting: 38.8  Smokeless Tobacco Never   Tobacco Cessation:  A prescription for an FDA-approved tobacco cessation medication was offered at discharge and the patient refused   Blood Alcohol level:  Lab Results  Component Value Date   [facility] <10 02/12/2023   ETH <10 12/18/2022    Metabolic Disorder Labs:  Lab Results  Component Value Date   HGBA1C 5.5 12/29/2022   MPG 111.15 12/29/2022   MPG 111 04/16/2022   No results found for: "PROLACTIN" Lab Results  Component Value Date   CHOL 208 (H) 12/29/2022   TRIG 145  12/29/2022   HDL 33 (L) 12/29/2022   CHOLHDL 6.3 12/29/2022   VLDL 29 12/29/2022   LDLCALC 146 (H) 12/29/2022   LDLCALC 125 (H) 04/16/2022    See Psychiatric Specialty Exam and Suicide Risk Assessment completed by Attending Physician prior to discharge.  Discharge destination:  Home  Is patient on multiple antipsychotic therapies at discharge:  No   Has Patient had three or more failed trials of antipsychotic monotherapy by history:  No  Recommended Plan for Multiple Antipsychotic Therapies: NA  Discharge Instructions     Diet - low sodium heart healthy   Complete by: As directed    Increase activity slowly   Complete by: As directed       Allergies as of 03/07/2023   No Known Allergies      Medication List     STOP taking these medications    temazepam 15 MG capsule Commonly known as: RESTORIL       TAKE these medications      Indication  divalproex 250 MG DR tablet Commonly known as: DEPAKOTE Take 3 tablets (750 mg total) by mouth 2 (two) times daily. What changed:  medication strength how much to take  Indication: Manic Phase of Manic-Depression   doxepin 50 MG capsule Commonly known as: SINEQUAN Take 1 capsule (50 mg total) by mouth at bedtime.    feeding supplement Liqd Take 237 mLs by mouth 3 (three) times daily between meals.    levOCARNitine 1 GM/10ML solution Commonly known as: CARNITOR Take 5 mLs (500 mg total) by mouth daily.    lurasidone 20 MG Tabs tablet Commonly known as: LATUDA Take 4 tablets (80 mg total) by mouth daily with supper. What changed:  medication strength how much to take  Indication: MIXED BIPOLAR AFFECTIVE DISORDER   melatonin 5 MG Tabs Take 1 tablet (5 mg total) by mouth at bedtime.    metoprolol succinate 50 MG 24 hr tablet Commonly known as: TOPROL-XL Take 1 tablet (50 mg total) by mouth daily. Start taking on: March 08, 2023  Indication: High Blood Pressure   multivitamin with minerals Tabs  tablet Take 1 tablet by mouth daily. Start taking on: March 08, 2023    tamsulosin 0.4 MG Caps capsule Commonly known as: FLOMAX Take 1 capsule (0.4 mg total) by mouth daily after supper.    traMADol 50 MG tablet Commonly known as: ULTRAM Take 1 tablet (50 mg total) by mouth every 6 (six) hours as needed for severe pain (pain score 7-10). What changed: reasons to take this    venlafaxine XR 150 MG 24 hr capsule Commonly known as: EFFEXOR-XR Take 1 capsule (150 mg total) by mouth daily after breakfast. Start taking on: March 08, 2023 What changed:  medication strength how much to take  Indication: Major Depressive Disorder        Follow-up Information     [facility] Follow up on 03/10/2023.   Why: Your hospital follow-up is scheduled for 03/10/23 at 11:00 AM. Please remember to bring your insurance card. Contact information: [address] [place] [address] [phone]                 Follow-up recommendations:  Activity:  AS tolerated    Signed: [name], MD 03/07/2023, 10:02 AM

## 2023-03-07 NOTE — Plan of Care (Signed)
  Problem: Education: Goal: Utilization of techniques to improve thought processes will improve Outcome: Progressing Goal: Knowledge of the prescribed therapeutic regimen will improve Outcome: Progressing   Problem: Activity: Goal: Interest or engagement in leisure activities will improve Outcome: Progressing Goal: Imbalance in normal sleep/wake cycle will improve Outcome: Progressing   

## 2023-03-07 NOTE — Plan of Care (Incomplete)
  Problem: Education: Goal: Utilization of techniques to improve thought processes will improve Outcome: Completed/Met Goal: Knowledge of the prescribed therapeutic regimen will improve Outcome: Completed/Met

## 2023-03-07 NOTE — BHH Suicide Risk Assessment (Signed)
 [facility] Discharge Suicide Risk Assessment   Principal Problem: Bipolar disorder, current episode depressed, severe, with psychotic features (HCC) Discharge Diagnoses: Principal Problem:   Bipolar disorder, current episode depressed, severe, with psychotic features (HCC) Active Problems:   Cocaine use   Rhabdomyolysis   Seizures (HCC)   Total Time spent with patient: 30 minutes  Musculoskeletal: Strength & Muscle Tone: within normal limits Gait & Station: normal Patient leans: N/A  Psychiatric Specialty Exam  Presentation  General Appearance:  Appropriate for Environment; Casual  Eye Contact: Fair  Speech: Clear and Coherent  Speech Volume: Normal  Handedness: Right   Mood and Affect  Mood: Euthymic  Duration of Depression Symptoms: Greater than two weeks  Affect: Congruent   Thought Process  Thought Processes: Coherent  Descriptions of Associations:Intact  Orientation:Full (Time, Place and Person)  Thought Content:Logical  History of Schizophrenia/Schizoaffective disorder:No  Duration of Psychotic Symptoms:N/A  Hallucinations:Hallucinations: None  Ideas of Reference:None  Suicidal Thoughts:Suicidal Thoughts: No  Homicidal Thoughts:Homicidal Thoughts: No   Sensorium  Memory: Immediate Fair; Recent Fair; Remote Fair  Judgment: Fair  Insight: Fair   Art therapist  Concentration: Fair  Attention Span: Fair  Recall: [employer] of Knowledge: Fair  Language: Fair   Psychomotor Activity  Psychomotor Activity: Psychomotor Activity: Normal   Assets  Assets: Communication Skills; Desire for Improvement; Physical Health   Sleep  Sleep: Sleep: Fair   Physical Exam: Physical Exam ROS Blood pressure 105/83, pulse 81, temperature (!) 97.4 F (36.3 C), resp. rate 16, SpO2 100%. There is no height or weight on file to calculate BMI.  Mental Status Per Nursing Assessment::   On Admission:  Suicidal ideation  indicated by patient  Demographic Factors:  Male, Caucasian, and Living alone  Loss Factors: Decrease in vocational status  Historical Factors: Impulsivity  Risk Reduction Factors:   Religious beliefs about death, Positive social support, and Positive coping skills or problem solving skills  Continued Clinical Symptoms:  Previous Psychiatric Diagnoses and Treatments  Cognitive Features That Contribute To Risk:  Thought constriction (tunnel vision)    Suicide Risk:  Minimal: No identifiable suicidal ideation.  Patients presenting with no risk factors but with morbid ruminations; may be classified as minimal risk based on the severity of the depressive symptoms   Follow-up Information     [facility] Follow up on 03/10/2023.   Why: Your hospital follow-up is scheduled for 03/10/23 at 11:00 AM. Please remember to bring your insurance card. Contact information: [address] [place] [address] [phone]                 Plan Of Care/Follow-up recommendations:  Activity:  AS tolerated  [name], MD 03/07/2023, 10:00 AM

## 2023-03-07 NOTE — Progress Notes (Signed)
 Patient ID: [name], male   DOB: Sep 16, 1960, 63 y.o.   MRN: [id number]  Patient was discharged from [facility] unit at approx 1220 escorted by staff. Patient denies SI/HI/AVH. Discharge packet to include printed AVS, printed prescription, Suicide Risk Assessment, and Transition Record reviewed with patient. Belongings returned. He declined to complete the Suicide safety plan and patient survey.

## 2023-03-07 NOTE — Progress Notes (Signed)
  [facility] Adult Case Management Discharge Plan :  Will you be returning to the same living situation after discharge:  Yes,  pt will return home  At discharge, do you have transportation home?: Yes,  CSW will assist with transportation  Do you have the ability to pay for your medications: Yes,  [employer] / [employer]  Release of information consent forms completed and in the chart;  Patient's signature needed at discharge.  Patient to Follow up at:  Follow-up Information     [facility] Follow up on 03/10/2023.   Why: Your hospital follow-up is scheduled for 03/10/23 at 11:00 AM. Please remember to bring your insurance card. Contact information: [address] [place] [address] [phone]                 Next level of care provider has access to [facility] Link:no  Safety Planning and Suicide Prevention discussed: Yes,  although pt declined with family member/friend, CSW went over SPE brochure with pt at discharge      Has patient been referred to the Quitline?: Patient refused referral for treatment  Patient has been referred for addiction treatment: Patient refused referral for treatment.  [address] LCSWA 03/07/2023, 10:06 AM

## 2023-04-11 ENCOUNTER — Other Ambulatory Visit: Payer: Self-pay

## 2023-04-11 DIAGNOSIS — M6282 Rhabdomyolysis: Secondary | ICD-10-CM | POA: Diagnosis present

## 2023-04-11 DIAGNOSIS — Z818 Family history of other mental and behavioral disorders: Secondary | ICD-10-CM

## 2023-04-11 DIAGNOSIS — F4310 Post-traumatic stress disorder, unspecified: Secondary | ICD-10-CM | POA: Diagnosis present

## 2023-04-11 DIAGNOSIS — Z888 Allergy status to other drugs, medicaments and biological substances status: Secondary | ICD-10-CM

## 2023-04-11 DIAGNOSIS — F6089 Other specific personality disorders: Secondary | ICD-10-CM | POA: Diagnosis present

## 2023-04-11 DIAGNOSIS — Z79899 Other long term (current) drug therapy: Secondary | ICD-10-CM

## 2023-04-11 DIAGNOSIS — I4892 Unspecified atrial flutter: Secondary | ICD-10-CM | POA: Diagnosis present

## 2023-04-11 DIAGNOSIS — Z87442 Personal history of urinary calculi: Secondary | ICD-10-CM

## 2023-04-11 DIAGNOSIS — Z96653 Presence of artificial knee joint, bilateral: Secondary | ICD-10-CM | POA: Diagnosis present

## 2023-04-11 DIAGNOSIS — R000 Tachycardia, unspecified: Secondary | ICD-10-CM | POA: Diagnosis present

## 2023-04-11 DIAGNOSIS — Z91148 Patient's other noncompliance with medication regimen for other reason: Secondary | ICD-10-CM

## 2023-04-11 DIAGNOSIS — F319 Bipolar disorder, unspecified: Secondary | ICD-10-CM | POA: Diagnosis present

## 2023-04-11 DIAGNOSIS — F4329 Adjustment disorder with other symptoms: Secondary | ICD-10-CM | POA: Diagnosis present

## 2023-04-11 DIAGNOSIS — G40909 Epilepsy, unspecified, not intractable, without status epilepticus: Principal | ICD-10-CM | POA: Diagnosis present

## 2023-04-11 DIAGNOSIS — Z7901 Long term (current) use of anticoagulants: Secondary | ICD-10-CM

## 2023-04-11 DIAGNOSIS — M19042 Primary osteoarthritis, left hand: Secondary | ICD-10-CM | POA: Diagnosis present

## 2023-04-11 DIAGNOSIS — E872 Acidosis, unspecified: Secondary | ICD-10-CM | POA: Diagnosis present

## 2023-04-11 DIAGNOSIS — I10 Essential (primary) hypertension: Secondary | ICD-10-CM | POA: Diagnosis present

## 2023-04-11 DIAGNOSIS — I2510 Atherosclerotic heart disease of native coronary artery without angina pectoris: Secondary | ICD-10-CM | POA: Diagnosis present

## 2023-04-11 DIAGNOSIS — Z87891 Personal history of nicotine dependence: Secondary | ICD-10-CM

## 2023-04-11 DIAGNOSIS — Z5982 Transportation insecurity: Secondary | ICD-10-CM

## 2023-04-11 DIAGNOSIS — R45851 Suicidal ideations: Secondary | ICD-10-CM | POA: Diagnosis present

## 2023-04-11 DIAGNOSIS — Z823 Family history of stroke: Secondary | ICD-10-CM

## 2023-04-11 DIAGNOSIS — Z9152 Personal history of nonsuicidal self-harm: Secondary | ICD-10-CM

## 2023-04-11 DIAGNOSIS — M19041 Primary osteoarthritis, right hand: Secondary | ICD-10-CM | POA: Diagnosis present

## 2023-04-11 DIAGNOSIS — G2581 Restless legs syndrome: Secondary | ICD-10-CM | POA: Diagnosis present

## 2023-04-11 DIAGNOSIS — I4819 Other persistent atrial fibrillation: Secondary | ICD-10-CM | POA: Insufficient documentation

## 2023-04-11 NOTE — ED Notes (Addendum)
 Pt dressed into paper scrubs by this RN and Jon Gills NT. Belongings placed in belongings bag Tan shoes White socks Black pants Black shirt Black belt Blue underwear Medicine  Phone glasses Empty bottle

## 2023-04-11 NOTE — ED Triage Notes (Addendum)
 Pt to ED via POV c/o suicidal ideation, right flank pain, and and right sided CP. Pt reports having 4 seizures today. After seizures developed flank pain. Pt also reports CP x58month but got worse today. Takes depakote for seizures and reports taking them everyday. Pt endorsing SI. Has self inflicted lac to lower right leg, no bleeding at this time

## 2023-04-12 ENCOUNTER — Emergency Department: Payer: MEDICAID

## 2023-04-12 ENCOUNTER — Inpatient Hospital Stay
Admission: EM | Admit: 2023-04-12 | Discharge: 2023-04-18 | DRG: 101 | Disposition: A | Discharge status: Home / Self Care | Payer: MEDICAID | Attending: Internal Medicine | Admitting: Internal Medicine

## 2023-04-12 DIAGNOSIS — R45851 Suicidal ideations: Secondary | ICD-10-CM | POA: Diagnosis not present

## 2023-04-12 DIAGNOSIS — R569 Unspecified convulsions: Principal | ICD-10-CM

## 2023-04-12 DIAGNOSIS — F1490 Cocaine use, unspecified, uncomplicated: Secondary | ICD-10-CM

## 2023-04-12 DIAGNOSIS — I10 Essential (primary) hypertension: Secondary | ICD-10-CM

## 2023-04-12 DIAGNOSIS — R079 Chest pain, unspecified: Secondary | ICD-10-CM

## 2023-04-12 DIAGNOSIS — R55 Syncope and collapse: Secondary | ICD-10-CM | POA: Insufficient documentation

## 2023-04-12 DIAGNOSIS — M6282 Rhabdomyolysis: Secondary | ICD-10-CM

## 2023-04-12 DIAGNOSIS — I4891 Unspecified atrial fibrillation: Secondary | ICD-10-CM | POA: Diagnosis present

## 2023-04-12 DIAGNOSIS — I4819 Other persistent atrial fibrillation: Secondary | ICD-10-CM | POA: Insufficient documentation

## 2023-04-12 LAB — URINALYSIS, ROUTINE W REFLEX MICROSCOPIC
Bilirubin Urine: NEGATIVE
Glucose, UA: NEGATIVE mg/dL
Hgb urine dipstick: NEGATIVE
Ketones, ur: NEGATIVE mg/dL
Leukocytes,Ua: NEGATIVE
Nitrite: NEGATIVE
Protein, ur: 30 mg/dL — AB
Specific Gravity, Urine: >1.046 — ABNORMAL HIGH (ref 1.005–1.030)
Squamous Epithelial / HPF: 0 /HPF (ref 0–5)
pH: 5 (ref 5.0–8.0)

## 2023-04-12 LAB — URINE DRUG SCREEN, QUALITATIVE (ARMC ONLY)
Amphetamines, Ur Screen: NOT DETECTED
Barbiturates, Ur Screen: NOT DETECTED
Benzodiazepine, Ur Scrn: NOT DETECTED
Cannabinoid 50 Ng, Ur ~~LOC~~: POSITIVE — AB
Cocaine Metabolite,Ur ~~LOC~~: POSITIVE — AB
MDMA (Ecstasy)Ur Screen: NOT DETECTED
Methadone Scn, Ur: NOT DETECTED
Opiate, Ur Screen: NOT DETECTED
Phencyclidine (PCP) Ur S: NOT DETECTED
Tricyclic, Ur Screen: NOT DETECTED

## 2023-04-12 LAB — COMPREHENSIVE METABOLIC PANEL WITH GFR
ALT: 20 U/L (ref 0–44)
AST: 54 U/L — ABNORMAL HIGH (ref 15–41)
Albumin: 4.7 g/dL (ref 3.5–5.0)
Alkaline Phosphatase: 57 U/L (ref 38–126)
Anion gap: 16 — ABNORMAL HIGH (ref 5–15)
BUN: 21 mg/dL — ABNORMAL HIGH (ref 6–20)
CO2: 17 mmol/L — ABNORMAL LOW (ref 22–32)
Calcium: 9.9 mg/dL (ref 8.9–10.3)
Chloride: 105 mmol/L (ref 98–111)
Creatinine, Ser: 1.1 mg/dL (ref 0.61–1.24)
GFR, Estimated: >60 mL/min (ref >60)
Glucose, Bld: 106 mg/dL — ABNORMAL HIGH (ref 70–99)
Potassium: 4.9 mmol/L (ref 3.5–5.1)
Sodium: 138 mmol/L (ref 135–145)
Total Bilirubin: 0.6 mg/dL (ref 0.0–1.2)
Total Protein: 8.2 g/dL — ABNORMAL HIGH (ref 6.5–8.1)

## 2023-04-12 LAB — CBC
HCT: 40.4 % (ref 39.0–52.0)
Hemoglobin: 14.2 g/dL (ref 13.0–17.0)
MCH: 30.5 pg (ref 26.0–34.0)
MCHC: 35.1 g/dL (ref 30.0–36.0)
MCV: 86.7 fL (ref 80.0–100.0)
Platelets: 160 10*3/uL (ref 150–400)
RBC: 4.66 MIL/uL (ref 4.22–5.81)
RDW: 13.8 % (ref 11.5–15.5)
WBC: 8 10*3/uL (ref 4.0–10.5)
nRBC: 0 % (ref 0.0–0.2)

## 2023-04-12 LAB — SALICYLATE LEVEL: Salicylate Lvl: <7 mg/dL — ABNORMAL LOW (ref 7.0–30.0)

## 2023-04-12 LAB — VALPROIC ACID LEVEL: Valproic Acid Lvl: 15 ug/mL — ABNORMAL LOW (ref 50.0–100.0)

## 2023-04-12 LAB — TROPONIN I (HIGH SENSITIVITY)
Troponin I (High Sensitivity): 43 ng/L — ABNORMAL HIGH (ref <18)
Troponin I (High Sensitivity): 45 ng/L — ABNORMAL HIGH (ref <18)

## 2023-04-12 LAB — CK: Total CK: 3835 U/L — ABNORMAL HIGH (ref 49–397)

## 2023-04-12 LAB — MAGNESIUM: Magnesium: 1.9 mg/dL (ref 1.7–2.4)

## 2023-04-12 LAB — ETHANOL: Alcohol, Ethyl (B): <10 mg/dL (ref <10)

## 2023-04-12 LAB — ACETAMINOPHEN LEVEL: Acetaminophen (Tylenol), Serum: <10 ug/mL — ABNORMAL LOW (ref 10–30)

## 2023-04-12 LAB — PHOSPHORUS: Phosphorus: 4.5 mg/dL (ref 2.5–4.6)

## 2023-04-12 MED ORDER — ORAL CARE MOUTH RINSE
15.0000 mL | OROMUCOSAL | Status: DC
  Administered 2023-04-12 – 2023-04-15 (×14): 15 mL via OROMUCOSAL
  Filled 2023-04-12 (×42): qty 15

## 2023-04-12 MED ORDER — KETOROLAC TROMETHAMINE 30 MG/ML IJ SOLN
30.0000 mg | Freq: Once | INTRAMUSCULAR | Status: AC
  Administered 2023-04-12: 30 mg via INTRAVENOUS
  Filled 2023-04-12: qty 1

## 2023-04-12 MED ORDER — LEVOCARNITINE 1 GM/10ML PO SOLN
500.0000 mg | Freq: Every day | ORAL | Status: DC
  Filled 2023-04-12 (×7): qty 5

## 2023-04-12 MED ORDER — SODIUM CHLORIDE 0.9 % IV BOLUS (SEPSIS)
1000.0000 mL | Freq: Once | INTRAVENOUS | Status: AC
  Administered 2023-04-12: 1000 mL via INTRAVENOUS

## 2023-04-12 MED ORDER — ENOXAPARIN SODIUM 40 MG/0.4ML IJ SOSY
40.0000 mg | PREFILLED_SYRINGE | INTRAMUSCULAR | Status: DC
  Administered 2023-04-12 – 2023-04-15 (×3): 40 mg via SUBCUTANEOUS
  Filled 2023-04-12 (×4): qty 0.4

## 2023-04-12 MED ORDER — ONDANSETRON HCL 4 MG PO TABS: 4.0000 mg | ORAL_TABLET | Freq: Four times a day (QID) | ORAL | Status: DC | PRN

## 2023-04-12 MED ORDER — VENLAFAXINE HCL ER 75 MG PO CP24
150.0000 mg | ORAL_CAPSULE | Freq: Every day | ORAL | Status: DC
  Administered 2023-04-12 – 2023-04-18 (×7): 150 mg via ORAL
  Filled 2023-04-12 (×4): qty 1
  Filled 2023-04-12 (×3): qty 2

## 2023-04-12 MED ORDER — ORAL CARE MOUTH RINSE: 15.0000 mL | OROMUCOSAL | Status: DC | PRN

## 2023-04-12 MED ORDER — DOXEPIN HCL 50 MG PO CAPS
50.0000 mg | ORAL_CAPSULE | Freq: Every day | ORAL | Status: DC
  Administered 2023-04-12 – 2023-04-17 (×6): 50 mg via ORAL
  Filled 2023-04-12 (×7): qty 1

## 2023-04-12 MED ORDER — SODIUM CHLORIDE 0.9% FLUSH: 3.0000 mL | INTRAVENOUS | Status: DC | PRN

## 2023-04-12 MED ORDER — LURASIDONE HCL 20 MG PO TABS
80.0000 mg | ORAL_TABLET | Freq: Every day | ORAL | Status: DC
  Administered 2023-04-13 – 2023-04-17 (×5): 80 mg via ORAL
  Filled 2023-04-12 (×9): qty 4

## 2023-04-12 MED ORDER — ENSURE ENLIVE PO LIQD
237.0000 mL | Freq: Three times a day (TID) | ORAL | Status: DC
  Administered 2023-04-12 – 2023-04-16 (×12): 237 mL via ORAL

## 2023-04-12 MED ORDER — TRAMADOL HCL 50 MG PO TABS
50.0000 mg | ORAL_TABLET | Freq: Four times a day (QID) | ORAL | Status: DC | PRN
  Administered 2023-04-12 – 2023-04-18 (×12): 50 mg via ORAL
  Filled 2023-04-12 (×12): qty 1

## 2023-04-12 MED ORDER — DIVALPROEX SODIUM 250 MG PO DR TAB
750.0000 mg | DELAYED_RELEASE_TABLET | Freq: Two times a day (BID) | ORAL | Status: DC
  Administered 2023-04-12 – 2023-04-18 (×13): 750 mg via ORAL
  Filled 2023-04-12 (×15): qty 1

## 2023-04-12 MED ORDER — MELATONIN 5 MG PO TABS
5.0000 mg | ORAL_TABLET | Freq: Every day | ORAL | Status: DC
  Administered 2023-04-12: 5 mg via ORAL
  Filled 2023-04-12 (×4): qty 1

## 2023-04-12 MED ORDER — IOHEXOL 300 MG/ML  SOLN
100.0000 mL | Freq: Once | INTRAMUSCULAR | Status: AC | PRN
  Administered 2023-04-12: 100 mL via INTRAVENOUS

## 2023-04-12 MED ORDER — SODIUM CHLORIDE 0.9 % IV SOLN: INTRAVENOUS | Status: DC

## 2023-04-12 MED ORDER — METOPROLOL SUCCINATE ER 50 MG PO TB24
50.0000 mg | ORAL_TABLET | Freq: Every day | ORAL | Status: DC
  Administered 2023-04-12 – 2023-04-15 (×4): 50 mg via ORAL
  Filled 2023-04-12 (×4): qty 1

## 2023-04-12 MED ORDER — TAMSULOSIN HCL 0.4 MG PO CAPS
0.4000 mg | ORAL_CAPSULE | Freq: Every day | ORAL | Status: DC
Start: 2023-04-12 — End: 2023-04-18
  Administered 2023-04-12 – 2023-04-17 (×6): 0.4 mg via ORAL
  Filled 2023-04-12 (×6): qty 1

## 2023-04-12 MED ORDER — LEVETIRACETAM IN NACL 1000 MG/100ML IV SOLN
1000.0000 mg | Freq: Once | INTRAVENOUS | Status: AC
  Administered 2023-04-12: 1000 mg via INTRAVENOUS
  Filled 2023-04-12: qty 100

## 2023-04-12 MED ORDER — SODIUM CHLORIDE 0.9% FLUSH
3.0000 mL | Freq: Two times a day (BID) | INTRAVENOUS | Status: DC
  Administered 2023-04-12 – 2023-04-13 (×2): 3 mL via INTRAVENOUS
  Administered 2023-04-15: 10 mL via INTRAVENOUS
  Administered 2023-04-16: 5 mL via INTRAVENOUS
  Administered 2023-04-16 – 2023-04-17 (×3): 10 mL via INTRAVENOUS

## 2023-04-12 MED ORDER — ONDANSETRON HCL 4 MG/2ML IJ SOLN: 4.0000 mg | Freq: Four times a day (QID) | INTRAMUSCULAR | Status: DC | PRN

## 2023-04-12 NOTE — H&P (Signed)
 History and Physical    [name] WUJ:[id number] DOB: 09-Jul-1960 DOA: 04/12/2023  PCP: [name], PA (Confirm with patient/family/NH records and if not entered, this has to be entered at [facility] point of entry) Patient coming from: Home  I have personally briefly reviewed patient's old medical records in [facility] Link  Chief Complaint: I had seizure again  HPI: [name] is a 63 y.o. male with medical history significant of seizure disorder, history of noncompliant with seizure medications, major depression disorder, HTN, substance abuse, presented with recurrent seizure and suicidal ideation.  Patient claimed that he had 4 seizures last night, with temporary loss of consciousness, " I do not know why I ended up on the ground" with severe flank pain and back pain which he reported likely related to seizure.  Denied any tongue biting or loss control urine bowel movement.  Patient reported that he takes Depakote 3 pills only once a day instead of 2 times a day as interested " I did not know as opposed to take them 2 times a day".  He also reported that he felt depressed and " wanted to end my life"   ED Course: Afebrile, no tachycardia now hypotensive.  CT T-spine and lumbar spine showed negative for fracture or dislocation.  CT chest abdomen pelvis showed no acute findings.  CT head negative for intracranial acute changes.  Blood work showed CK 3835, UA pending, valproic acid level 15 less than therapeutic  Patient was given Keppra 1, IV bolus 2000 mL in the ED.  Review of Systems: As per HPI otherwise 14 point review of systems negative.    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 echo 5/17: EF 60-65%, no RWMA, LV dias fxn nl, LA mildly dilated, RV sys fxn nl, PASP nl, moderate sized circumferential pericardial effusion was identified, 2.12 cm around the LV free wall, <1 cm around the RV free wall. Features were not c/w tamponade physiology   PTSD (post-traumatic stress disorder)    Witnessed brother's suicide.   Restless leg syndrome    Seizures (HCC)    Syncope     Past Surgical History:  Procedure Laterality Date   APPENDECTOMY     CYSTOSCOPY WITH URETEROSCOPY AND STENT PLACEMENT     ESOPHAGOGASTRODUODENOSCOPY N/A 01/11/2016   Procedure: ESOPHAGOGASTRODUODENOSCOPY (EGD);  Surgeon: Charlott Rakes, MD;  Location: [facility] ENDOSCOPY;  Service: Endoscopy;  Laterality: N/A;   ESOPHAGOGASTRODUODENOSCOPY N/A 04/09/2020   Procedure: ESOPHAGOGASTRODUODENOSCOPY (EGD);  Surgeon: [name], MD;  Location: [facility] ENDOSCOPY;  Service: Gastroenterology;  Laterality: N/A;   INCISION AND DRAINAGE ABSCESS Left 01/02/2018   Procedure: INCISION AND DRAINAGE LEFT KNEE;  Surgeon: Deeann Saint, MD;  Location: [facility] ORS;  Service: Orthopedics;  Laterality: Left;   JOINT REPLACEMENT Right    TKR   KNEE ARTHROSCOPY Right 06/25/2014   Procedure: ARTHROSCOPY KNEE;  Surgeon: Deeann Saint, MD;  Location: [facility] ORS;  Service: Orthopedics;  Laterality: Right;  partial arthroscopic medial menisectomy   LAPAROSCOPIC APPENDECTOMY N/A 06/02/2021   Procedure: APPENDECTOMY LAPAROSCOPIC;  Surgeon: Campbell Lerner, MD;  Location: [facility] ORS;  Service: General;  Laterality: N/A;   TOTAL KNEE ARTHROPLASTY Right 04/22/2015   Procedure: TOTAL KNEE ARTHROPLASTY;  Surgeon: Deeann Saint, MD;  Location: [facility] ORS;  Service: Orthopedics;  Laterality: Right;   TOTAL KNEE ARTHROPLASTY Left 10/30/2017   Procedure: TOTAL KNEE ARTHROPLASTY;  Surgeon: Deeann Saint, MD;  Location: [facility] ORS;  Service: Orthopedics;  Laterality: Left;   TOTAL KNEE REVISION Left 01/02/2018    Procedure: poly exchange of tibia and patella left knee;  Surgeon: Deeann Saint, MD;  Location: [facility] ORS;  Service: Orthopedics;  Laterality: Left;   UMBILICAL HERNIA REPAIR  06/02/2021   Procedure: HERNIA REPAIR UMBILICAL ADULT;  Surgeon: Campbell Lerner, MD;  Location: [facility] ORS;  Service: General;;     reports that he quit smoking about 38 years ago. His smoking use included cigarettes. He started smoking about 58 years ago. He has a 15 pack-year smoking history. He has never used smokeless tobacco. He reports that he does not currently use alcohol. He reports current drug use. Drugs: Marijuana and Cocaine.  No Known Allergies  Family History  Problem Relation Age of Onset   CVA Mother        deceased at age 14   Depression Brother        Died by suicide at age 45    Prior to Admission medications   Medication Sig Start Date End Date Taking? Authorizing Provider  divalproex (DEPAKOTE) 250 MG DR tablet Take 3 tablets (750 mg total) by mouth 2 (two) times daily. 03/07/23   [name], MD  doxepin (SINEQUAN) 50 MG capsule Take 1 capsule (50 mg total) by mouth at bedtime. 03/07/23   [name], MD  feeding supplement (ENSURE ENLIVE / ENSURE PLUS) LIQD Take 237 mLs by mouth 3 (three) times daily between meals. 03/07/23   [name], MD  levOCARNitine (CARNITOR) 1 GM/10ML solution Take 5 mLs (500 mg total) by mouth daily. 02/15/23   Arnetha Courser, MD  lurasidone (LATUDA) 20 MG TABS tablet Take 4 tablets (80 mg total) by mouth daily with supper. 03/07/23 04/06/23  [name], MD  melatonin 5 MG TABS Take 1 tablet (5 mg total) by mouth at bedtime. 03/07/23   [name], MD  metoprolol succinate (TOPROL-XL) 50 MG 24 hr tablet Take 1 tablet (50 mg total) by mouth daily. 03/08/23   [name], MD  Multiple Vitamin (MULTIVITAMIN WITH MINERALS) TABS tablet Take 1 tablet by mouth daily. 03/08/23   [name], MD  tamsulosin (FLOMAX) 0.4 MG CAPS capsule Take 1 capsule  (0.4 mg total) by mouth daily after supper. 12/22/22 06/20/23  Gillis Santa, MD  traMADol (ULTRAM) 50 MG tablet Take 1 tablet (50 mg total) by mouth every 6 (six) hours as needed for severe pain (pain score 7-10). 03/07/23   [name], MD  venlafaxine XR (EFFEXOR-XR) 150 MG 24 hr capsule Take 1 capsule (150 mg total) by mouth daily after breakfast. 03/08/23   [name], MD    Physical Exam: Vitals:   04/12/23 0400 04/12/23 0500 04/12/23 0700 04/12/23 0800  BP: 113/88 116/76 114/86 113/80  Pulse: 78 81 74 71  Resp: 16 11 (!) 21 18  Temp:   98 F (36.7 C)   TempSrc:      SpO2: 96% 100% 96% 99%  Weight:      Height:        Constitutional: NAD, calm, comfortable Vitals:   04/12/23 0400 04/12/23 0500 04/12/23 0700 04/12/23  0800  BP: 113/88 116/76 114/86 113/80  Pulse: 78 81 74 71  Resp: 16 11 (!) 21 18  Temp:   98 F (36.7 C)   TempSrc:      SpO2: 96% 100% 96% 99%  Weight:      Height:       Eyes: PERRL, lids and conjunctivae normal ENMT: Mucous membranes are moist. Posterior pharynx clear of any exudate or lesions.Normal dentition.  Neck: normal, supple, no masses, no thyromegaly Respiratory: clear to auscultation bilaterally, no wheezing, no crackles. Normal respiratory effort. No accessory muscle use.  Cardiovascular: Regular rate and rhythm, no murmurs / rubs / gallops. No extremity edema. 2+ pedal pulses. No carotid bruits.  Abdomen: no tenderness, no masses palpated. No hepatosplenomegaly. Bowel sounds positive.  Musculoskeletal: no clubbing / cyanosis. No joint deformity upper and lower extremities. Good ROM, no contractures. Normal muscle tone.  Skin: no rashes, lesions, ulcers. No induration Neurologic: CN 2-12 grossly intact. Sensation intact, DTR normal. Strength 5/5 in all 4.  Psychiatric: Normal judgment and insight. Alert and oriented x 3. Normal mood.     Labs on Admission: I have personally reviewed following labs and imaging studies  CBC: Recent  Labs  Lab 04/12/23 0257  WBC 8.0  HGB 14.2  HCT 40.4  MCV 86.7  PLT 160   Basic Metabolic Panel: Recent Labs  Lab 04/12/23 0121  NA 138  K 4.9  CL 105  CO2 17*  GLUCOSE 106*  BUN 21*  CREATININE 1.10  CALCIUM 9.9  MG 1.9  PHOS 4.5   GFR: Estimated Creatinine Clearance: 71.4 mL/min (by C-G formula based on SCr of 1.1 mg/dL). Liver Function Tests: Recent Labs  Lab 04/12/23 0121  AST 54*  ALT 20  ALKPHOS 57  BILITOT 0.6  PROT 8.2*  ALBUMIN 4.7   No results for input(s): "LIPASE", "AMYLASE" in the last 168 hours. No results for input(s): "AMMONIA" in the last 168 hours. Coagulation Profile: No results for input(s): "INR", "PROTIME" in the last 168 hours. Cardiac Enzymes: Recent Labs  Lab 04/12/23 0121  CKTOTAL 3,835*   BNP (last 3 results) No results for input(s): "PROBNP" in the last 8760 hours. HbA1C: No results for input(s): "HGBA1C" in the last 72 hours. CBG: No results for input(s): "GLUCAP" in the last 168 hours. Lipid Profile: No results for input(s): "CHOL", "HDL", "LDLCALC", "TRIG", "CHOLHDL", "LDLDIRECT" in the last 72 hours. Thyroid Function Tests: No results for input(s): "TSH", "T4TOTAL", "FREET4", "T3FREE", "THYROIDAB" in the last 72 hours. Anemia Panel: No results for input(s): "VITAMINB12", "FOLATE", "FERRITIN", "TIBC", "IRON", "RETICCTPCT" in the last 72 hours. Urine analysis:    Component Value Date/Time   COLORURINE STRAW (A) 04/13/2022 1930   APPEARANCEUR CLEAR (A) 04/13/2022 1930   LABSPEC 1.028 04/13/2022 1930   PHURINE 6.0 04/13/2022 1930   GLUCOSEU NEGATIVE 04/13/2022 1930   HGBUR NEGATIVE 04/13/2022 1930   BILIRUBINUR NEGATIVE 04/13/2022 1930   KETONESUR NEGATIVE 04/13/2022 1930   PROTEINUR NEGATIVE 04/13/2022 1930   NITRITE NEGATIVE 04/13/2022 1930   LEUKOCYTESUR NEGATIVE 04/13/2022 1930    Radiological Exams on Admission: CT T-SPINE NO CHARGE Result Date: 04/12/2023 CLINICAL DATA:  Blunt polytrauma, seizure EXAM: CT  Thoracic and Lumbar spine with contrast TECHNIQUE: Multiplanar CT images of the thoracic and lumbar spine were reconstructed from contemporary CT of the Chest, Abdomen, and Pelvis. RADIATION DOSE REDUCTION: This exam was performed according to the departmental dose-optimization program which includes automated exposure control, adjustment of the mA and/or kV according to  patient size and/or use of iterative reconstruction technique. CONTRAST:  No additional contrast was administered for creation of these reformats COMPARISON:  None Available. FINDINGS: CT THORACIC SPINE FINDINGS Alignment: Normal. Vertebrae: No acute fracture or focal pathologic process. Paraspinal and other soft tissues: Negative. Disc levels: Intervertebral disc heights are preserved. Spinal canal is widely patent. CT LUMBAR SPINE FINDINGS Segmentation: 5 lumbar type vertebrae. Alignment: Normal. Vertebrae: No acute fracture or focal pathologic process. Paraspinal and other soft tissues: Negative. Disc levels: Intervertebral disc heights are preserved. Spinal canal is widely patent. No significant neuroforaminal narrowing. IMPRESSION: 1. Normal CT examination of the thoracic and lumbar spine. Electronically Signed   By: Helyn Numbers M.D.   On: 04/12/2023 03:20   CT L-SPINE NO CHARGE Result Date: 04/12/2023 CLINICAL DATA:  Blunt polytrauma, seizure EXAM: CT Thoracic and Lumbar spine with contrast TECHNIQUE: Multiplanar CT images of the thoracic and lumbar spine were reconstructed from contemporary CT of the Chest, Abdomen, and Pelvis. RADIATION DOSE REDUCTION: This exam was performed according to the departmental dose-optimization program which includes automated exposure control, adjustment of the mA and/or kV according to patient size and/or use of iterative reconstruction technique. CONTRAST:  No additional contrast was administered for creation of these reformats COMPARISON:  None Available. FINDINGS: CT THORACIC SPINE FINDINGS Alignment:  Normal. Vertebrae: No acute fracture or focal pathologic process. Paraspinal and other soft tissues: Negative. Disc levels: Intervertebral disc heights are preserved. Spinal canal is widely patent. CT LUMBAR SPINE FINDINGS Segmentation: 5 lumbar type vertebrae. Alignment: Normal. Vertebrae: No acute fracture or focal pathologic process. Paraspinal and other soft tissues: Negative. Disc levels: Intervertebral disc heights are preserved. Spinal canal is widely patent. No significant neuroforaminal narrowing. IMPRESSION: 1. Normal CT examination of the thoracic and lumbar spine. Electronically Signed   By: Helyn Numbers M.D.   On: 04/12/2023 03:20   CT HEAD WO CONTRAST ( ) Result Date: 04/12/2023 CLINICAL DATA:  Head trauma and seizure EXAM: CT HEAD WITHOUT CONTRAST CT CERVICAL SPINE WITHOUT CONTRAST TECHNIQUE: Multidetector CT imaging of the head and cervical spine was performed following the standard protocol without intravenous contrast. Multiplanar CT image reconstructions of the cervical spine were also generated. RADIATION DOSE REDUCTION: This exam was performed according to the departmental dose-optimization program which includes automated exposure control, adjustment of the mA and/or kV according to patient size and/or use of iterative reconstruction technique. COMPARISON:  None Available. FINDINGS: CT HEAD FINDINGS Brain: No mass,hemorrhage or extra-axial collection. Normal appearance of the parenchyma and CSF spaces. Vascular: Atherosclerotic calcification of the internal carotid arteries at the skull base. No abnormal hyperdensity of the major intracranial arteries or dural venous sinuses. Skull: The visualized skull base, calvarium and extracranial soft tissues are normal. Sinuses/Orbits: No fluid levels or advanced mucosal thickening of the visualized paranasal sinuses. No mastoid or middle ear effusion. Normal orbits. Other: None. CT CERVICAL SPINE FINDINGS Alignment: Grade 1 anterolisthesis at  C3-4. Facets are aligned. Occipital condyles are normally positioned. Skull base and vertebrae: No acute fracture. Soft tissues and spinal canal: No prevertebral fluid or swelling. No visible canal hematoma. Disc levels: No advanced spinal canal or neural foraminal stenosis. Upper chest: No pneumothorax, pulmonary nodule or pleural effusion. Other: Normal visualized paraspinal cervical soft tissues. IMPRESSION: 1. No acute intracranial abnormality. 2. No acute fracture or traumatic subluxation of the cervical spine. Electronically Signed   By: Deatra Robinson M.D.   On: 04/12/2023 03:14   CT Cervical Spine Wo Contrast Result Date: 04/12/2023 CLINICAL DATA:  Head  trauma and seizure EXAM: CT HEAD WITHOUT CONTRAST CT CERVICAL SPINE WITHOUT CONTRAST TECHNIQUE: Multidetector CT imaging of the head and cervical spine was performed following the standard protocol without intravenous contrast. Multiplanar CT image reconstructions of the cervical spine were also generated. RADIATION DOSE REDUCTION: This exam was performed according to the departmental dose-optimization program which includes automated exposure control, adjustment of the mA and/or kV according to patient size and/or use of iterative reconstruction technique. COMPARISON:  None Available. FINDINGS: CT HEAD FINDINGS Brain: No mass,hemorrhage or extra-axial collection. Normal appearance of the parenchyma and CSF spaces. Vascular: Atherosclerotic calcification of the internal carotid arteries at the skull base. No abnormal hyperdensity of the major intracranial arteries or dural venous sinuses. Skull: The visualized skull base, calvarium and extracranial soft tissues are normal. Sinuses/Orbits: No fluid levels or advanced mucosal thickening of the visualized paranasal sinuses. No mastoid or middle ear effusion. Normal orbits. Other: None. CT CERVICAL SPINE FINDINGS Alignment: Grade 1 anterolisthesis at C3-4. Facets are aligned. Occipital condyles are normally  positioned. Skull base and vertebrae: No acute fracture. Soft tissues and spinal canal: No prevertebral fluid or swelling. No visible canal hematoma. Disc levels: No advanced spinal canal or neural foraminal stenosis. Upper chest: No pneumothorax, pulmonary nodule or pleural effusion. Other: Normal visualized paraspinal cervical soft tissues. IMPRESSION: 1. No acute intracranial abnormality. 2. No acute fracture or traumatic subluxation of the cervical spine. Electronically Signed   By: Deatra Robinson M.D.   On: 04/12/2023 03:14   CT CHEST ABDOMEN PELVIS W CONTRAST Result Date: 04/12/2023 CLINICAL DATA:  Polytrauma, blunt, seizure, right flank pain, right chest pain EXAM: CT CHEST, ABDOMEN, AND PELVIS WITH CONTRAST TECHNIQUE: Multidetector CT imaging of the chest, abdomen and pelvis was performed following the standard protocol during bolus administration of intravenous contrast. RADIATION DOSE REDUCTION: This exam was performed according to the departmental dose-optimization program which includes automated exposure control, adjustment of the mA and/or kV according to patient size and/or use of iterative reconstruction technique. CONTRAST:  OMNIPAQUE IOHEXOL 300 MG/ML  SOLN COMPARISON:  None Available. FINDINGS: CT CHEST FINDINGS Cardiovascular: Extensive multi-vessel coronary artery calcification. Global cardiac size within limits. No pericardial effusion. Central pulmonary arteries are of normal caliber. Thoracic aorta is unremarkable. Mediastinum/Nodes: No enlarged mediastinal, hilar, or axillary lymph nodes. Thyroid gland, trachea, and esophagus demonstrate no significant findings. Lungs/Pleura: Mild right parenchymal scarring. Bibasilar dependent atelectasis. No confluent pulmonary infiltrate. No pneumothorax or pleural effusion. No central obstructing lesion. Musculoskeletal: No chest wall mass or suspicious bone lesions identified. CT ABDOMEN PELVIS FINDINGS Hepatobiliary: Cholelithiasis without  superimposed pericholecystic inflammatory change. Liver unremarkable; no enhancing intrahepatic mass identified. No intra or extrahepatic biliary ductal dilation. Pancreas: Unremarkable Spleen: Unremarkable Adrenals/Urinary Tract: Adrenal glands are unremarkable. Simple cortical cysts are seen within the kidneys bilaterally for which no follow-up imaging is recommended. Moderate cortical scarring bilaterally. The kidneys are otherwise unremarkable. Bladder unremarkable. Stomach/Bowel: Stomach is within normal limits. Appendix absent. No evidence of bowel wall thickening, distention, or inflammatory changes. Vascular/Lymphatic: Aortic atherosclerosis. No enlarged abdominal or pelvic lymph nodes. Reproductive: Prostate is unremarkable. Other: Tiny fat containing umbilical hernia Musculoskeletal: No acute or significant osseous findings. IMPRESSION: 1. No acute intrathoracic or intra-abdominal injury identified. 2. Extensive multi-vessel coronary artery calcification. 3. Cholelithiasis. Aortic Atherosclerosis (ICD10-I70.0). Electronically Signed   By: Helyn Numbers M.D.   On: 04/12/2023 03:10   DG Chest 1 View Result Date: 04/12/2023 CLINICAL DATA:  Chest pain EXAM: CHEST  1 VIEW COMPARISON:  12/20/2022 FINDINGS: The heart  size and mediastinal contours are within normal limits. Both lungs are clear. The visualized skeletal structures are unremarkable. IMPRESSION: No active disease. Electronically Signed   By: Charlett Nose M.D.   On: 04/12/2023 00:22    EKG: Independently reviewed.  Sinus rhythm, no acute ST changes.  Assessment/Plan Principal Problem:   Seizure [facility]) Active Problems:   Seizures (HCC)  (please populate well all problems here in Problem List. (For example, if patient is on BP meds at home and you resume or decide to hold them, it is a problem that needs to be her. Same for CAD, COPD, HLD and so on)  Seizure secondary to noncoherent with seizure medications -Patient admitted that he only  takes Depakote once a day instead of 2 times a day as instructed.  Discussed with patient regarding importance of coherent with antiseizure medication instructions.  Patient appears understanding and agreed. -Continue Depakote 750 mg twice daily.  Went to patient's discharge record from last admission in February, when patient coming with acute transaminitis which was attributed to Depakote.  This time, however his liver function panel remained stable.  Continue L-Carnitine as per neurology recommendation from last admission.  Rhabdomyolysis -Nontraumatic -Continue IV fluid, UA pending -Repeat CK tomorrow  Suicidal ideation Recurrent major depression disorder -Psychiatry consulted, likely will inpatient psychiatry admission after medically stabilized  Sinus tachycardia -Likely postictal, improving   DVT prophylaxis: Lovenox Code Status: Full code Family Communication: None at bedside Disposition Plan: Expect less than 2 midnight hospital stay Consults called: None Admission status: Telemetry observation   Emeline General MD [facility] Pager [phone]  04/12/2023, 9:44 AM

## 2023-04-12 NOTE — ED Notes (Signed)
 Phlebotomist to triage for blood draw

## 2023-04-12 NOTE — ED Provider Notes (Signed)
 [facility] Provider Note    Event Date/Time   First MD Initiated Contact with Patient 04/12/23 0123     (approximate)   History   Psychiatric Evaluation, Chest Pain, and Flank Pain   HPI  [name] is a 63 y.o. male with history of hypertension, seizures, bipolar disorder, hepatitis C, substance use disorder who presents to the emergency department stating that he had 4 seizures at home.  States he lives at home alone but that these were unwitnessed.  He states he knows that he had a seizure because anytime he woke up on the ground.  He states that all 4 seizures happened over the course of 2 hours.  He denies any tongue biting or incontinence but is complaining of diffuse neck and back pain, right-sided chest pain, flank pain after the seizures.  Denies numbness, tingling or weakness.  Does report using cocaine last week.  Denies any other drug or alcohol use.  He is not sure if he has history of epileptic or nonepileptic seizures.  He states he is also feeling suicidal.  He reports that this is normal for him after having seizures.  No plan.  No HI or hallucinations.  He has a superficial injury to the lower extremity that was self-inflicted.  Reports his tetanus vaccine is up-to-date.   History provided by patient.    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 echo 5/17: EF 60-65%, no RWMA, LV dias fxn nl, LA mildly dilated, RV sys fxn nl, PASP nl, moderate sized circumferential pericardial effusion was identified, 2.12 cm around the LV free wall, <1 cm around the RV free wall. Features were not c/w tamponade  physiology   PTSD (post-traumatic stress disorder)    Witnessed brother's suicide.   Restless leg syndrome    Seizures (HCC)    Syncope     Past Surgical History:  Procedure Laterality Date   APPENDECTOMY     CYSTOSCOPY WITH URETEROSCOPY AND STENT PLACEMENT     ESOPHAGOGASTRODUODENOSCOPY N/A 01/11/2016   Procedure: ESOPHAGOGASTRODUODENOSCOPY (EGD);  Surgeon: Charlott Rakes, MD;  Location: [facility] ENDOSCOPY;  Service: Endoscopy;  Laterality: N/A;   ESOPHAGOGASTRODUODENOSCOPY N/A 04/09/2020   Procedure: ESOPHAGOGASTRODUODENOSCOPY (EGD);  Surgeon: [name], MD;  Location: [facility] ENDOSCOPY;  Service: Gastroenterology;  Laterality: N/A;   INCISION AND DRAINAGE ABSCESS Left 01/02/2018   Procedure: INCISION AND DRAINAGE LEFT KNEE;  Surgeon: Deeann Saint, MD;  Location: [facility] ORS;  Service: Orthopedics;  Laterality: Left;   JOINT REPLACEMENT Right    TKR   KNEE ARTHROSCOPY Right 06/25/2014   Procedure: ARTHROSCOPY KNEE;  Surgeon: Deeann Saint, MD;  Location: [facility] ORS;  Service: Orthopedics;  Laterality: Right;  partial arthroscopic medial menisectomy   LAPAROSCOPIC APPENDECTOMY N/A 06/02/2021   Procedure: APPENDECTOMY LAPAROSCOPIC;  Surgeon: Campbell Lerner, MD;  Location: [facility] ORS;  Service: General;  Laterality: N/A;   TOTAL KNEE ARTHROPLASTY Right 04/22/2015   Procedure: TOTAL KNEE ARTHROPLASTY;  Surgeon: Deeann Saint, MD;  Location: [facility] ORS;  Service: Orthopedics;  Laterality: Right;   TOTAL  KNEE ARTHROPLASTY Left 10/30/2017   Procedure: TOTAL KNEE ARTHROPLASTY;  Surgeon: Deeann Saint, MD;  Location: [facility] ORS;  Service: Orthopedics;  Laterality: Left;   TOTAL KNEE REVISION Left 01/02/2018   Procedure: poly exchange of tibia and patella left knee;  Surgeon: Deeann Saint, MD;  Location: [facility] ORS;  Service: Orthopedics;  Laterality: Left;   UMBILICAL HERNIA REPAIR  06/02/2021   Procedure: HERNIA REPAIR UMBILICAL ADULT;  Surgeon: Campbell Lerner, MD;  Location: [facility] ORS;  Service:  General;;    MEDICATIONS:  Prior to Admission medications   Medication Sig Start Date End Date Taking? Authorizing Provider  divalproex (DEPAKOTE) 250 MG DR tablet Take 3 tablets (750 mg total) by mouth 2 (two) times daily. 03/07/23   [name], MD  doxepin (SINEQUAN) 50 MG capsule Take 1 capsule (50 mg total) by mouth at bedtime. 03/07/23   [name], MD  feeding supplement (ENSURE ENLIVE / ENSURE PLUS) LIQD Take 237 mLs by mouth 3 (three) times daily between meals. 03/07/23   [name], MD  levOCARNitine (CARNITOR) 1 GM/10ML solution Take 5 mLs (500 mg total) by mouth daily. 02/15/23   Arnetha Courser, MD  lurasidone (LATUDA) 20 MG TABS tablet Take 4 tablets (80 mg total) by mouth daily with supper. 03/07/23 04/06/23  [name], MD  melatonin 5 MG TABS Take 1 tablet (5 mg total) by mouth at bedtime. 03/07/23   [name], MD  metoprolol succinate (TOPROL-XL) 50 MG 24 hr tablet Take 1 tablet (50 mg total) by mouth daily. 03/08/23   [name], MD  Multiple Vitamin (MULTIVITAMIN WITH MINERALS) TABS tablet Take 1 tablet by mouth daily. 03/08/23   [name], MD  tamsulosin (FLOMAX) 0.4 MG CAPS capsule Take 1 capsule (0.4 mg total) by mouth daily after supper. 12/22/22 06/20/23  Gillis Santa, MD  traMADol (ULTRAM) 50 MG tablet Take 1 tablet (50 mg total) by mouth every 6 (six) hours as needed for severe pain (pain score 7-10). 03/07/23   [name], MD  venlafaxine XR (EFFEXOR-XR) 150 MG 24 hr capsule Take 1 capsule (150 mg total) by mouth daily after breakfast. 03/08/23   [name], MD    Physical Exam   Triage Vital Signs: ED Triage Vitals  Encounter Vitals Group     BP 04/11/23 2353 (!) 126/98     Systolic BP Percentile --      Diastolic BP Percentile --      Pulse Rate 04/11/23 2353 83     Resp 04/11/23 2353 18     Temp 04/11/23 2353 97.7 F (36.5 C)     Temp Source 04/11/23 2353 Oral     SpO2 04/11/23 2353 99 %     Weight 04/11/23 2344 165 lb  (74.8 kg)     Height 04/11/23 2344 5\' 9"  (1.753 m)     Head Circumference --      Peak Flow --      Pain Score 04/11/23 2343 10     Pain Loc --      Pain Education --      Exclude from Growth Chart --     Most recent vital signs: Vitals:   04/12/23 0400 04/12/23 0500  BP: 113/88 116/76  Pulse: 78 81  Resp: 16 11  Temp:    SpO2: 96% 100%     CONSTITUTIONAL: Alert, responds appropriately to questions. Well-appearing; well-nourished; GCS 15 HEAD: Normocephalic; atraumatic EYES: Conjunctivae clear, PERRL, EOMI ENT: normal nose; no rhinorrhea; moist mucous membranes; pharynx without  lesions noted; no dental injury; no septal hematoma, no epistaxis; no facial deformity or bony tenderness NECK: Supple, no midline spinal tenderness, step-off or deformity; trachea midline CARD: RRR; S1 and S2 appreciated; no murmurs, no clicks, no rubs, no gallops RESP: Normal chest excursion without splinting or tachypnea; breath sounds clear and equal bilaterally; no wheezes, no rhonchi, no rales; no hypoxia or respiratory distress CHEST:  chest wall stable, no crepitus or ecchymosis or deformity, tender over the right chest wall, no flail chest ABD/GI: Non-distended; soft, non-tender, no rebound, no guarding; no ecchymosis or other lesions noted PELVIS:  stable, nontender to palpation BACK: Diffuse spinal tenderness without step-off or deformity.  Right-sided CVA tenderness.  No overlying skin changes. EXT: Normal ROM in all joints; no edema; normal capillary refill; no cyanosis, no bony tenderness or bony deformity of patient's extremities, no joint effusions, compartments are soft, extremities are warm and well-perfused, no ecchymosis, superficial abrasion noted to the lower extremity that was self-inflicted SKIN: Normal color for age and race; warm NEURO: No facial asymmetry, normal speech, moving all extremities equally  ED Results / Procedures / Treatments   LABS: (all labs ordered are listed,  but only abnormal results are displayed) Labs Reviewed  COMPREHENSIVE METABOLIC PANEL WITH GFR - Abnormal; Notable for the following components:      Result Value   CO2 17 (*)    Glucose, Bld 106 (*)    BUN 21 (*)    Total Protein 8.2 (*)    AST 54 (*)    Anion gap 16 (*)    All other components within normal limits  SALICYLATE LEVEL - Abnormal; Notable for the following components:   Salicylate Lvl <7.0 (*)    All other components within normal limits  ACETAMINOPHEN LEVEL - Abnormal; Notable for the following components:   Acetaminophen (Tylenol), Serum <10 (*)    All other components within normal limits  CK - Abnormal; Notable for the following components:   Total CK 3,835 (*)    All other components within normal limits  VALPROIC ACID LEVEL - Abnormal; Notable for the following components:   Valproic Acid Lvl 15 (*)    All other components within normal limits  TROPONIN I (HIGH SENSITIVITY) - Abnormal; Notable for the following components:   Troponin I (High Sensitivity) 43 (*)    All other components within normal limits  TROPONIN I (HIGH SENSITIVITY) - Abnormal; Notable for the following components:   Troponin I (High Sensitivity) 45 (*)    All other components within normal limits  ETHANOL  CBC  URINE DRUG SCREEN, QUALITATIVE ([facility] ONLY)  URINALYSIS, ROUTINE W REFLEX MICROSCOPIC  MAGNESIUM  PHOSPHORUS     EKG:  EKG Interpretation Date/Time:  Tuesday April 11 2023 23:51:21 EDT Ventricular Rate:  83 PR Interval:  154 QRS Duration:  72 QT Interval:  360 QTC Calculation: 423 R Axis:   65  Text Interpretation: Normal sinus rhythm Minimal voltage criteria for LVH, may be normal variant ( Sokolow-Lyon ) ST & T wave abnormality, consider lateral ischemia Abnormal ECG When compared with ECG of 13-Feb-2023 08:11, Vent. rate has decreased BY  58 BPM Criteria for Inferior infarct are no longer Present ST no longer depressed in Anterior leads T wave inversion no longer  evident in Inferior leads T wave inversion now evident in Anterolateral leads Confirmed by Desmond Tufano, Baxter Hire [phone]) on 04/12/2023 1:24:18 AM          RADIOLOGY: My personal review and interpretation of imaging:  Imaging shows no acute traumatic injury.  I have personally reviewed all radiology reports. CT T-SPINE NO CHARGE Result Date: 04/12/2023 CLINICAL DATA:  Blunt polytrauma, seizure EXAM: CT Thoracic and Lumbar spine with contrast TECHNIQUE: Multiplanar CT images of the thoracic and lumbar spine were reconstructed from contemporary CT of the Chest, Abdomen, and Pelvis. RADIATION DOSE REDUCTION: This exam was performed according to the departmental dose-optimization program which includes automated exposure control, adjustment of the mA and/or kV according to patient size and/or use of iterative reconstruction technique. CONTRAST:  No additional contrast was administered for creation of these reformats COMPARISON:  None Available. FINDINGS: CT THORACIC SPINE FINDINGS Alignment: Normal. Vertebrae: No acute fracture or focal pathologic process. Paraspinal and other soft tissues: Negative. Disc levels: Intervertebral disc heights are preserved. Spinal canal is widely patent. CT LUMBAR SPINE FINDINGS Segmentation: 5 lumbar type vertebrae. Alignment: Normal. Vertebrae: No acute fracture or focal pathologic process. Paraspinal and other soft tissues: Negative. Disc levels: Intervertebral disc heights are preserved. Spinal canal is widely patent. No significant neuroforaminal narrowing. IMPRESSION: 1. Normal CT examination of the thoracic and lumbar spine. Electronically Signed   By: Helyn Numbers M.D.   On: 04/12/2023 03:20   CT L-SPINE NO CHARGE Result Date: 04/12/2023 CLINICAL DATA:  Blunt polytrauma, seizure EXAM: CT Thoracic and Lumbar spine with contrast TECHNIQUE: Multiplanar CT images of the thoracic and lumbar spine were reconstructed from contemporary CT of the Chest, Abdomen, and Pelvis. RADIATION  DOSE REDUCTION: This exam was performed according to the departmental dose-optimization program which includes automated exposure control, adjustment of the mA and/or kV according to patient size and/or use of iterative reconstruction technique. CONTRAST:  No additional contrast was administered for creation of these reformats COMPARISON:  None Available. FINDINGS: CT THORACIC SPINE FINDINGS Alignment: Normal. Vertebrae: No acute fracture or focal pathologic process. Paraspinal and other soft tissues: Negative. Disc levels: Intervertebral disc heights are preserved. Spinal canal is widely patent. CT LUMBAR SPINE FINDINGS Segmentation: 5 lumbar type vertebrae. Alignment: Normal. Vertebrae: No acute fracture or focal pathologic process. Paraspinal and other soft tissues: Negative. Disc levels: Intervertebral disc heights are preserved. Spinal canal is widely patent. No significant neuroforaminal narrowing. IMPRESSION: 1. Normal CT examination of the thoracic and lumbar spine. Electronically Signed   By: Helyn Numbers M.D.   On: 04/12/2023 03:20   CT HEAD WO CONTRAST ( ) Result Date: 04/12/2023 CLINICAL DATA:  Head trauma and seizure EXAM: CT HEAD WITHOUT CONTRAST CT CERVICAL SPINE WITHOUT CONTRAST TECHNIQUE: Multidetector CT imaging of the head and cervical spine was performed following the standard protocol without intravenous contrast. Multiplanar CT image reconstructions of the cervical spine were also generated. RADIATION DOSE REDUCTION: This exam was performed according to the departmental dose-optimization program which includes automated exposure control, adjustment of the mA and/or kV according to patient size and/or use of iterative reconstruction technique. COMPARISON:  None Available. FINDINGS: CT HEAD FINDINGS Brain: No mass,hemorrhage or extra-axial collection. Normal appearance of the parenchyma and CSF spaces. Vascular: Atherosclerotic calcification of the internal carotid arteries at the skull  base. No abnormal hyperdensity of the major intracranial arteries or dural venous sinuses. Skull: The visualized skull base, calvarium and extracranial soft tissues are normal. Sinuses/Orbits: No fluid levels or advanced mucosal thickening of the visualized paranasal sinuses. No mastoid or middle ear effusion. Normal orbits. Other: None. CT CERVICAL SPINE FINDINGS Alignment: Grade 1 anterolisthesis at C3-4. Facets are aligned. Occipital condyles are normally positioned. Skull base and vertebrae: No acute fracture. Soft tissues and  spinal canal: No prevertebral fluid or swelling. No visible canal hematoma. Disc levels: No advanced spinal canal or neural foraminal stenosis. Upper chest: No pneumothorax, pulmonary nodule or pleural effusion. Other: Normal visualized paraspinal cervical soft tissues. IMPRESSION: 1. No acute intracranial abnormality. 2. No acute fracture or traumatic subluxation of the cervical spine. Electronically Signed   By: Deatra Robinson M.D.   On: 04/12/2023 03:14   CT Cervical Spine Wo Contrast Result Date: 04/12/2023 CLINICAL DATA:  Head trauma and seizure EXAM: CT HEAD WITHOUT CONTRAST CT CERVICAL SPINE WITHOUT CONTRAST TECHNIQUE: Multidetector CT imaging of the head and cervical spine was performed following the standard protocol without intravenous contrast. Multiplanar CT image reconstructions of the cervical spine were also generated. RADIATION DOSE REDUCTION: This exam was performed according to the departmental dose-optimization program which includes automated exposure control, adjustment of the mA and/or kV according to patient size and/or use of iterative reconstruction technique. COMPARISON:  None Available. FINDINGS: CT HEAD FINDINGS Brain: No mass,hemorrhage or extra-axial collection. Normal appearance of the parenchyma and CSF spaces. Vascular: Atherosclerotic calcification of the internal carotid arteries at the skull base. No abnormal hyperdensity of the major intracranial  arteries or dural venous sinuses. Skull: The visualized skull base, calvarium and extracranial soft tissues are normal. Sinuses/Orbits: No fluid levels or advanced mucosal thickening of the visualized paranasal sinuses. No mastoid or middle ear effusion. Normal orbits. Other: None. CT CERVICAL SPINE FINDINGS Alignment: Grade 1 anterolisthesis at C3-4. Facets are aligned. Occipital condyles are normally positioned. Skull base and vertebrae: No acute fracture. Soft tissues and spinal canal: No prevertebral fluid or swelling. No visible canal hematoma. Disc levels: No advanced spinal canal or neural foraminal stenosis. Upper chest: No pneumothorax, pulmonary nodule or pleural effusion. Other: Normal visualized paraspinal cervical soft tissues. IMPRESSION: 1. No acute intracranial abnormality. 2. No acute fracture or traumatic subluxation of the cervical spine. Electronically Signed   By: Deatra Robinson M.D.   On: 04/12/2023 03:14   CT CHEST ABDOMEN PELVIS W CONTRAST Result Date: 04/12/2023 CLINICAL DATA:  Polytrauma, blunt, seizure, right flank pain, right chest pain EXAM: CT CHEST, ABDOMEN, AND PELVIS WITH CONTRAST TECHNIQUE: Multidetector CT imaging of the chest, abdomen and pelvis was performed following the standard protocol during bolus administration of intravenous contrast. RADIATION DOSE REDUCTION: This exam was performed according to the departmental dose-optimization program which includes automated exposure control, adjustment of the mA and/or kV according to patient size and/or use of iterative reconstruction technique. CONTRAST:  OMNIPAQUE IOHEXOL 300 MG/ML  SOLN COMPARISON:  None Available. FINDINGS: CT CHEST FINDINGS Cardiovascular: Extensive multi-vessel coronary artery calcification. Global cardiac size within limits. No pericardial effusion. Central pulmonary arteries are of normal caliber. Thoracic aorta is unremarkable. Mediastinum/Nodes: No enlarged mediastinal, hilar, or axillary lymph  nodes. Thyroid gland, trachea, and esophagus demonstrate no significant findings. Lungs/Pleura: Mild right parenchymal scarring. Bibasilar dependent atelectasis. No confluent pulmonary infiltrate. No pneumothorax or pleural effusion. No central obstructing lesion. Musculoskeletal: No chest wall mass or suspicious bone lesions identified. CT ABDOMEN PELVIS FINDINGS Hepatobiliary: Cholelithiasis without superimposed pericholecystic inflammatory change. Liver unremarkable; no enhancing intrahepatic mass identified. No intra or extrahepatic biliary ductal dilation. Pancreas: Unremarkable Spleen: Unremarkable Adrenals/Urinary Tract: Adrenal glands are unremarkable. Simple cortical cysts are seen within the kidneys bilaterally for which no follow-up imaging is recommended. Moderate cortical scarring bilaterally. The kidneys are otherwise unremarkable. Bladder unremarkable. Stomach/Bowel: Stomach is within normal limits. Appendix absent. No evidence of bowel wall thickening, distention, or inflammatory changes. Vascular/Lymphatic: Aortic atherosclerosis. No  enlarged abdominal or pelvic lymph nodes. Reproductive: Prostate is unremarkable. Other: Tiny fat containing umbilical hernia Musculoskeletal: No acute or significant osseous findings. IMPRESSION: 1. No acute intrathoracic or intra-abdominal injury identified. 2. Extensive multi-vessel coronary artery calcification. 3. Cholelithiasis. Aortic Atherosclerosis (ICD10-I70.0). Electronically Signed   By: Helyn Numbers M.D.   On: 04/12/2023 03:10   DG Chest 1 View Result Date: 04/12/2023 CLINICAL DATA:  Chest pain EXAM: CHEST  1 VIEW COMPARISON:  12/20/2022 FINDINGS: The heart size and mediastinal contours are within normal limits. Both lungs are clear. The visualized skeletal structures are unremarkable. IMPRESSION: No active disease. Electronically Signed   By: Charlett Nose M.D.   On: 04/12/2023 00:22     PROCEDURES:  Critical Care performed: Yes, see critical care  procedure note(s)   CRITICAL CARE Performed by: Baxter Hire Lavita Pontius   Total critical care time: 30 minutes  Critical care time was exclusive of separately billable procedures and treating other patients.  Critical care was necessary to treat or prevent imminent or life-threatening deterioration.  Critical care was time spent personally by me on the following activities: development of treatment plan with patient and/or surrogate as well as nursing, discussions with consultants, evaluation of patient's response to treatment, examination of patient, obtaining history from patient or surrogate, ordering and performing treatments and interventions, ordering and review of laboratory studies, ordering and review of radiographic studies, pulse oximetry and re-evaluation of patient's condition.   Procedures    IMPRESSION / MDM / ASSESSMENT AND PLAN / ED COURSE  I reviewed the triage vital signs and the nursing notes.  Patient here with complaints of right-sided chest pain, flank pain, diffuse neck and back pain after multiple presumed seizures at home.  Also having SI and history of cocaine use.     DIFFERENTIAL DIAGNOSIS (includes but not limited to):   Cervical spine fracture, intracranial hemorrhage, thoracic or lumbar fracture, kidney injury, rib fractures, pneumothorax, symptoms seem atypical for ACS, PE or dissection.  Differential also includes epileptic seizures, nonepileptic seizures, substance use disorder, suicidal ideation.  Patient's presentation is most consistent with acute presentation with potential threat to life or bodily function.  PLAN: Will obtain labs, urine, trauma imaging.  Will give IV fluids, pain medication.   MEDICATIONS GIVEN IN ED: Medications  levETIRAcetam (KEPPRA) IVPB 1000 mg/100 mL premix (has no administration in time range)  0.9 %  sodium chloride infusion (has no administration in time range)  ketorolac (TORADOL) 30 MG/ML injection 30 mg (30 mg  Intravenous Given 04/12/23 0303)  sodium chloride 0.9 % bolus 1,000 mL (0 mLs Intravenous Stopped 04/12/23 0400)  sodium chloride 0.9 % bolus 1,000 mL (0 mLs Intravenous Stopped 04/12/23 0400)  iohexol (OMNIPAQUE) 300 MG/ML solution 100 mL (100 mLs Intravenous Contrast Given 04/12/23 0300)     ED COURSE: Normal hemoglobin.  Elevated anion gap metabolic acidosis possibly secondary to recent seizure activity.  Normal glucose.  He denies history of alcohol abuse and ethanol level is negative.  Negative Tylenol salicylate level.  Depakote level is not therapeutic.  CK is 3800.  Given multiple recent seizures and cocaine use, I think this is nontraumatic rhabdomyolysis.  Will continue to aggressively hydrate but suspect that he is going to need medical admission now prior to being cleared for psychiatric disposition.  He is here voluntarily for his suicidal ideation.  Patient does have elevated troponins in the 40s but they are flat.  EKG shows no ischemic change.  His chest pain is right-sided and reproducible with  palpation and likely related to his seizure activity.  Discussed with patient that I do not feel that Depakote is necessarily a great medicine to control his seizure-like activity and he may be taking this instead for mood stabilization.  It is unclear based on his records that I have reviewed if he has true epileptic or nonepileptic seizures.  Will give IV Keppra in the meantime.  Psychiatry will be consulted to follow along for this patient as he may need psychiatric admission once medically cleared.   CONSULTS:  Consulted and discussed patient's case with hospitalist, Dr. Para March.  I have recommended admission and consulting physician agrees and will place admission orders.  Patient (and family if present) agree with this plan.   I reviewed all nursing notes, vitals, pertinent previous records.  All labs, EKGs, imaging ordered have been independently reviewed and interpreted by  myself.    OUTSIDE RECORDS REVIEWED: Reviewed last admission for seizures, nontraumatic rhabdomyolysis, SI in February 2025.       FINAL CLINICAL IMPRESSION(S) / ED DIAGNOSES   Final diagnoses:  Seizure (HCC)  Non-traumatic rhabdomyolysis  Suicidal ideation  Right-sided chest pain  Cocaine use     Rx / DC Orders   ED Discharge Orders     None        Note:  This document was prepared using Dragon voice recognition software and may include unintentional dictation errors.   Onyekachi Gathright, Layla Maw, DO 04/12/23 [phone]

## 2023-04-12 NOTE — Consult Note (Signed)
 [facility] Psychiatric Consult Initial  Patient Name: .[name]  MRN: [id number]  DOB: 1960-06-16  Consult Order details:  Orders (From admission, onward)     Start     Ordered   04/12/23 0703  IP CONSULT TO PSYCHIATRY       Ordering Provider: Raelyn Number, DO  Provider:  (Not yet assigned)  Question Answer Comment  Place call to: SI   Reason for Consult Consult      04/12/23 0702             Mode of Visit: Tele-visit Virtual Statement:TELE PSYCHIATRY ATTESTATION & CONSENT As the provider for this telehealth consult, I attest that I verified the patient's identity using two separate identifiers, introduced myself to the patient, provided my credentials, disclosed my location, and performed this encounter via a HIPAA-compliant, real-time, face-to-face, two-way, interactive audio and video platform and with the full consent and agreement of the patient (or guardian as applicable.) Patient physical location: [facility]. Telehealth provider physical location: home office in state of [place].   Video start time: 11:40 AM Video end time: 12:12 PM    Psychiatry Consult Evaluation  Service Date: April 12, 2023 LOS:  LOS: 0 days  Chief Complaint "I had four seizures last night and it brought on suicidal thoughts and I cut myself on the leg. I still think I'm severely depressed. If I could take a bottle of pills I would".  Primary Psychiatric Diagnoses  Suicidal ideations   Assessment  [name] is a 63 y.o. male presenting to [facility] ED on 04/12/23 at 1:19 AM due to self injurious behaviors and suicidal ideations.  Patient reports having a seizure causing him to cut himself prior to ED admission.  Patient reports a history of cutting but states he has not cut in 3 years.  He reports suicidal ideations with a plan to take pills or hang himself.  He reports auditory hallucinations, command style, telling him to kill himself daily.  Per his record, he has a psychiatric history of  alcohol abuse, Cluster B personality disorder, MDD, malingering, cocaine use, SI, PTSD, bipolar disorder with psychotic features and polysubstance abuse. He reports being prescribed Depakote, Latuda, Doxepin, Effexor. Patient reports being established with Rolm Gala at [facility] for outpatient psychiatry. He reports that his last appointment was on 03/15/23 and his next appointment is 05/02/23. Patient reports medication compliance and appointment adherence. He reports multiple past psychiatric hospitalizations, with his most recent being last month, here at [facility] of which he stayed for approximately "28 days".   Patient denies homicidal ideations, paranoia or delusional thought.  He denies visual hallucinations currently.  He reports that he has experienced visual hallucinations in the past, stating that he sees his brother who is no longer living.  He denies illicit substance use but states that he uses cocaine last week.  Patient denies tobacco use.  Given patient's psychiatric history and active suicidal ideations with intent and plan, patient does meet criteria for inpatient psychiatry.   Diagnoses:  Active Hospital problems: Suicidal ideations  Plan   ## Psychiatric Medication Recommendations:  Continue home medications  ## Medical Decision Making Capacity: Not specifically addressed in this encounter  ## Further Work-up:  -- Defer to EDP EKG or UDS -- most recent EKG on 04/11/2023 had QtC of 423 -- Pertinent labwork reviewed earlier this admission includes: CMP, CBC, Depakote level low at 15, Acetaminophen and salicylate level   ## Disposition:-- We recommend inpatient psychiatric hospitalization when  medically cleared. Patient is under voluntary admission status at this time; please IVC if attempts to leave hospital.  ## Behavioral / Environmental: - No specific recommendations at this time.     ## Safety and Observation Level:  - Based on my clinical evaluation, I estimate the patient  to be at low risk of self harm in the current setting. - At this time, we recommend  routine. This decision is based on my review of the chart including patient's history and current presentation, interview of the patient, mental status examination, and consideration of suicide risk including evaluating suicidal ideation, plan, intent, suicidal or self-harm behaviors, risk factors, and protective factors. This judgment is based on our ability to directly address suicide risk, implement suicide prevention strategies, and develop a safety plan while the patient is in the clinical setting. Please contact our team if there is a concern that risk level has changed.  CSSR Risk Category:C-SSRS RISK CATEGORY: Low Risk  Suicide Risk Assessment: Patient has following modifiable risk factors for suicide: active suicidal ideation, which we are addressing by inpatient psychiatry. Patient has following non-modifiable or demographic risk factors for suicide: male gender Patient has the following protective factors against suicide: Access to outpatient mental health care  Thank you for this consult request. Recommendations have been communicated to the primary team.  We will recommend inpatient psychiatry once patient is medically cleared at this time.   Mcneil Sober, NP       History of Present Illness  Relevant Aspects of Hospital ED Course:  Admitted on 04/12/2023 for SI with intent and plan.  Patient Report:  "I hear voices telling me to kill myself daily. I plan to hang myself"  Psych ROS:  Depression: yes. Patient presents with depression, SI and he has a past diagnosis of MDD Anxiety:   Mania (lifetime and current): patient has a hx of bipolar disorder Psychosis: (lifetime and current): yes. Patient reports hearing command voices. He reports seeing his brother, who died, periodically.  Collateral information:  [facility] therapist, Clydene Laming, accompanied patient and provided an update on plan  to assist with housing needs.   Review of Systems  Psychiatric/Behavioral:  Positive for depression, hallucinations and suicidal ideas.   All other systems reviewed and are negative.   Medical hx of CAD, essential hypertension, SVT, Atrial flutter, Rhabdomyolysis, syncope and sinus tachycardia. He reports being prescribed metoprolol to manage hypertension. Psychiatric and Social History  Psychiatric History:  Information collected from patient, ED treatment team.  Prev Dx/Sx: see above Current Psych Provider: [facility] Rolm Gala Home Meds (current): see above Previous Med Trials: he is unable to identify past medications Therapy: yes. He is currently established with therapy at [facility]  Prior Psych Hospitalization: yes.  Prior Self Harm: yes. Cutting Prior Violence: denies  Family Psych History: brother committed suicide by self inflicted GSW in front of patient Family Hx suicide: yes  Social History:  Developmental Hx: normal Educational Hx: 8 th grade Occupational Hx: disable Legal Hx: denies Living Situation: independent living in inhabitable conditions Spiritual Hx: Christian Access to weapons/lethal means: denies   Substance History Alcohol: denies  Type of alcohol n/a Last Drink n/a Number of drinks per day n/a History of alcohol withdrawal seizures no History of DT's no Tobacco: denies Illicit drugs: cocaine used last week Prescription drug abuse: denies Rehab hx: denies  Exam Findings  Physical Exam: no abnormalities observed Vital Signs:  Temp:  [97.7 F (36.5 C)-98 F (36.7 C)] 97.8 F (  36.6 C) (04/02 1100) Pulse Rate:  [52-83] 52 (04/02 1119) Resp:  [11-21] 20 (04/02 1100) BP: (107-126)/(48-98) 107/48 (04/02 1119) SpO2:  [96 %-100 %] 99 % (04/02 1100) Weight:  [74.8 kg] 74.8 kg (04/01 2344) Blood pressure (!) 107/48, pulse (!) 52, temperature 97.8 F (36.6 C), temperature source Oral, resp. rate 20, height 5\' 9"  (1.753 m), weight 74.8 kg, SpO2  99%. Body mass index is 24.37 kg/m.  Physical Exam Vitals and nursing note reviewed.  Constitutional:      Appearance: He is well-developed.  Neurological:     General: No focal deficit present.     Mental Status: He is alert and oriented to person, place, and time.     Mental Status Exam: General Appearance: Disheveled  Orientation:  Full (Time, Place, and Person)  Memory:  Immediate;   Good Recent;   Good Remote;   Good  Concentration:  Concentration: Good  Recall:  Good  Attention  Good  Eye Contact:  Good  Speech:  Clear and Coherent  Language:  Good  Volume:  Normal  Mood: "pretty crappy. All I think about is suicide."  Affect:  Congruent  Thought Process:  Goal Directed  Thought Content:  Hallucinations: Command:  "tell me to kill myself"  Suicidal Thoughts:  Yes.  with intent/plan  Homicidal Thoughts:  No  Judgement:  Poor  Insight:  Lacking  Psychomotor Activity:  Normal  Akathisia:  NA  Fund of Knowledge:  Good      Assets:  Communication Skills Desire for Improvement Housing  Cognition:  WNL  ADL's:  Intact  AIMS (if indicated):        Other History   These have been pulled in through the EMR, reviewed, and updated if appropriate.  Family History:  The patient's family history includes CVA in his mother; Depression in his brother.  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 echo 5/17: EF 60-65%, no RWMA, LV dias fxn nl, LA mildly dilated, RV sys fxn nl, PASP nl, moderate sized circumferential pericardial effusion was identified, 2.12 cm around the LV free wall, <1 cm around the RV free wall.  Features were not c/w tamponade physiology   PTSD (post-traumatic stress disorder)    Witnessed brother's suicide.   Restless leg syndrome    Seizures (HCC)    Syncope     Surgical History: Past Surgical History:  Procedure Laterality Date   APPENDECTOMY     CYSTOSCOPY WITH URETEROSCOPY AND STENT PLACEMENT     ESOPHAGOGASTRODUODENOSCOPY N/A 01/11/2016   Procedure: ESOPHAGOGASTRODUODENOSCOPY (EGD);  Surgeon: Charlott Rakes, MD;  Location: [facility] ENDOSCOPY;  Service: Endoscopy;  Laterality: N/A;   ESOPHAGOGASTRODUODENOSCOPY N/A 04/09/2020   Procedure: ESOPHAGOGASTRODUODENOSCOPY (EGD);  Surgeon: [name], MD;  Location: [facility] ENDOSCOPY;  Service: Gastroenterology;  Laterality: N/A;   INCISION AND DRAINAGE ABSCESS Left 01/02/2018   Procedure: INCISION AND DRAINAGE LEFT KNEE;  Surgeon: Deeann Saint, MD;  Location: [facility] ORS;  Service: Orthopedics;  Laterality: Left;   JOINT REPLACEMENT Right    TKR  KNEE ARTHROSCOPY Right 06/25/2014   Procedure: ARTHROSCOPY KNEE;  Surgeon: Deeann Saint, MD;  Location: [facility] ORS;  Service: Orthopedics;  Laterality: Right;  partial arthroscopic medial menisectomy   LAPAROSCOPIC APPENDECTOMY N/A 06/02/2021   Procedure: APPENDECTOMY LAPAROSCOPIC;  Surgeon: Campbell Lerner, MD;  Location: [facility] ORS;  Service: General;  Laterality: N/A;   TOTAL KNEE ARTHROPLASTY Right 04/22/2015   Procedure: TOTAL KNEE ARTHROPLASTY;  Surgeon: Deeann Saint, MD;  Location: [facility] ORS;  Service: Orthopedics;  Laterality: Right;   TOTAL KNEE ARTHROPLASTY Left 10/30/2017   Procedure: TOTAL KNEE ARTHROPLASTY;  Surgeon: Deeann Saint, MD;  Location: [facility] ORS;  Service: Orthopedics;  Laterality: Left;   TOTAL KNEE REVISION Left 01/02/2018   Procedure: poly exchange of tibia and patella left knee;  Surgeon: Deeann Saint, MD;  Location: [facility] ORS;  Service: Orthopedics;  Laterality: Left;   UMBILICAL HERNIA REPAIR  06/02/2021   Procedure: HERNIA REPAIR UMBILICAL ADULT;  Surgeon:  Campbell Lerner, MD;  Location: [facility] ORS;  Service: General;;     Medications:   Current Facility-Administered Medications:    0.9 %  sodium chloride infusion, , Intravenous, Continuous, Ward, Layla Maw, DO, Last Rate: 125 mL/hr at 04/12/23 0920, New Bag at 04/12/23 0920   divalproex (DEPAKOTE) DR tablet 750 mg, 750 mg, Oral, BID, Mikey College T, MD, 750 mg at 04/12/23 1119   doxepin (SINEQUAN) capsule 50 mg, 50 mg, Oral, QHS, Zhang, Renae Fickle, MD   enoxaparin (LOVENOX) injection 40 mg, 40 mg, Subcutaneous, Q24H, Zhang, Renae Fickle, MD   feeding supplement (ENSURE ENLIVE / ENSURE PLUS) liquid 237 mL, 237 mL, Oral, TID BM, Mikey College T, MD, 237 mL at 04/12/23 1114   levOCARNitine (CARNITOR) 1 GM/10ML solution 500 mg, 500 mg, Oral, Daily, Zhang, Ping T, MD   lurasidone (LATUDA) tablet 80 mg, 80 mg, Oral, Q supper, Zhang, Ilda Foil T, MD   melatonin tablet 5 mg, 5 mg, Oral, QHS, Mikey College T, MD   metoprolol succinate (TOPROL-XL) 24 hr tablet 50 mg, 50 mg, Oral, Daily, Chipper Herb, Ping T, MD, 50 mg at 04/12/23 1119   ondansetron (ZOFRAN) tablet 4 mg, 4 mg, Oral, Q6H PRN **OR** ondansetron (ZOFRAN) injection 4 mg, 4 mg, Intravenous, Q6H PRN, Emeline General, MD   Oral care mouth rinse, 15 mL, Mouth Rinse, Q2H, Mikey College T, MD, 15 mL at 04/12/23 1119   Oral care mouth rinse, 15 mL, Mouth Rinse, PRN, Mikey College T, MD   sodium chloride flush (NS) 0.9 % injection 3-10 mL, 3-10 mL, Intravenous, Q12H, Mikey College T, MD, 3 mL at 04/12/23 1114   sodium chloride flush (NS) 0.9 % injection 3-10 mL, 3-10 mL, Intravenous, PRN, Mikey College T, MD   tamsulosin (FLOMAX) capsule 0.4 mg, 0.4 mg, Oral, QPC supper, Mikey College T, MD   traMADol Janean Sark) tablet 50 mg, 50 mg, Oral, Q6H PRN, Mikey College T, MD, 50 mg at 04/12/23 0981   venlafaxine XR (EFFEXOR-XR) 24 hr capsule 150 mg, 150 mg, Oral, QPC breakfast, Mikey College T, MD  Current Outpatient Medications:    divalproex (DEPAKOTE) 250 MG DR tablet, Take 3 tablets (750 mg  total) by mouth 2 (two) times daily., Disp: 180 tablet, Rfl: 0   doxepin (SINEQUAN) 50 MG capsule, Take 1 capsule (50 mg total) by mouth at bedtime., Disp: 30 capsule, Rfl: 0   levOCARNitine (CARNITOR) 1 GM/10ML solution, Take 5 mLs (500 mg total) by mouth daily., Disp: , Rfl:    lurasidone (LATUDA) 20 MG TABS tablet, Take 4  tablets (80 mg total) by mouth daily with supper. (Patient taking differently: Take 20 mg by mouth daily with supper.), Disp: 120 tablet, Rfl: 0   melatonin 5 MG TABS, Take 1 tablet (5 mg total) by mouth at bedtime., Disp: 30 tablet, Rfl: 0   metoprolol succinate (TOPROL-XL) 50 MG 24 hr tablet, Take 1 tablet (50 mg total) by mouth daily., Disp: 30 tablet, Rfl: 0   Multiple Vitamin (MULTIVITAMIN WITH MINERALS) TABS tablet, Take 1 tablet by mouth daily., Disp: 30 tablet, Rfl: 0   tamsulosin (FLOMAX) 0.4 MG CAPS capsule, Take 1 capsule (0.4 mg total) by mouth daily after supper., Disp: , Rfl:    traMADol (ULTRAM) 50 MG tablet, Take 1 tablet (50 mg total) by mouth every 6 (six) hours as needed for severe pain (pain score 7-10)., Disp: 30 tablet, Rfl: 0   venlafaxine XR (EFFEXOR-XR) 150 MG 24 hr capsule, Take 1 capsule (150 mg total) by mouth daily after breakfast., Disp: 30 capsule, Rfl: 0   feeding supplement (ENSURE ENLIVE / ENSURE PLUS) LIQD, Take 237 mLs by mouth 3 (three) times daily between meals., Disp: 237 mL, Rfl: 12  Allergies: Allergies  Allergen Reactions   Ketorolac Tromethamine Hives and Rash    Other Reaction(s): Not available    Mcneil Sober, NP

## 2023-04-12 NOTE — ED Notes (Signed)
 Hospital meal provided, pt tolerated w/o complaints.  Waste discarded appropriately.

## 2023-04-12 NOTE — TOC Initial Note (Signed)
 Transition of Care [facility]) - Initial/Assessment Note    Patient Details  Name: [name] MRN: [id number] Date of Birth: [birth year]  [facility] CM/SW Contact:    Elberta Fortis, RN Phone Number: 04/12/2023, 2:08 PM  Clinical Narrative:                 Called by ED nurse to evaluate patient. There's been no TOC consult yet. He has a guardian Clydene Laming [phone] and a QP person Lionel December [phone]. Pt has been living in a home with a roommate but the water and power were turned off. The home has unvaccinated pets and feces and trash everywhere. Imelda Pillow states pt won't come out of his room because of the smell and conditions in the home. Pt expresses a wish and need to find another place to live. He needs to be Dc'd to inpatient psych for [facility] and wished to go to [facility] unit. Notified his nurse Efraim Kaufmann of his preference for the BHU here. Requested Davida email this RN the guardianship paperwork as we don't have anything in his chart indicating that he has a guardian. Imelda Pillow also requested his medical records and was directed to call the medical records dept.   Expected Discharge Plan: Psychiatric Hospital Barriers to Discharge: Barriers Resolved   Patient Goals and CMS Choice Patient states their goals for this hospitalization and ongoing recovery are:: "To find a new place to live"          Expected Discharge Plan and Services       Living arrangements for the past 2 months: Single Family Home                                      Prior Living Arrangements/Services Living arrangements for the past 2 months: Single Family Home Lives with:: Friends Patient language and need for interpreter reviewed:: Yes Do you feel safe going back to the place where you live?: No   Home with no running water or electricity. Home with unkempt and unvaccinated pets with trash and feces all over the home.  Need for Family Participation in Patient  Care: No (Comment) Care giver support system in place?: No (comment) Current home services:  (None but has a guardian and QP assigned to him.) Criminal Activity/Legal Involvement Pertinent to Current Situation/Hospitalization: No - Comment as needed  Activities of Daily Living      Permission Sought/Granted Permission sought to share information with : Guardian Clydene Laming [phone])                Emotional Assessment Appearance:: Appears stated age Attitude/Demeanor/Rapport: Gracious Affect (typically observed): Appropriate, Calm, Pleasant Orientation: : Oriented to Self, Oriented to Place, Oriented to  Time, Oriented to Situation Alcohol / Substance Use: Never Used Psych Involvement: Yes (comment)  Admission diagnosis:  Syncope [R55] Seizure [facility]) [R56.9] Patient Active Problem List   Diagnosis Date Noted   Syncope 04/12/2023   Seizure (HCC) 04/12/2023   Seizures (HCC) 02/13/2023   Transaminitis 02/13/2023   Polysubstance abuse (HCC) 02/13/2023   Sinus tachycardia 02/13/2023   Rhabdomyolysis 02/12/2023   Mild tetrahydrocannabinol (THC) abuse 12/24/2022   Bipolar disorder, current episode depressed, severe, with psychotic features (HCC) 12/24/2022   Atrial flutter (HCC) 12/21/2022   SVT (supraventricular tachycardia) (HCC) 12/20/2022   PTSD (post-traumatic stress disorder) 09/16/2022   Suicidal ideation 09/15/2022   Malingering 06/28/2021  Cocaine use    Moderate episode of recurrent major depressive disorder (HCC) 05/15/2021   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[facility]) 05/03/2021   Essential hypertension 01/18/2021   Alcohol abuse 01/18/2021   Coronary artery disease of native artery of native heart with stable angina pectoris (HCC)    PCP:  [name], PA Pharmacy:   [facility] [phone] - [place], [place] - [address] [place] [address] Phone: [phone] Fax: [phone]  [facility] [place], [place] - [address] [place] [address] Phone: [phone] Fax: [phone]     Social Drivers of Health (SDOH) Social History: SDOH Screenings   Food Insecurity: No Food Insecurity (02/15/2023)  Recent Concern: Food Insecurity - Food Insecurity Present (12/20/2022)  Housing: Low Risk  (02/15/2023)  Transportation Needs: No Transportation Needs (02/15/2023)  Recent Concern: Transportation Needs - Unmet Transportation Needs (02/14/2023)  Utilities: Not At Risk (02/15/2023)  Recent Concern: Utilities - At Risk (02/14/2023)  Alcohol Screen: Low Risk  (02/15/2023)  Social Connections: Socially Isolated (02/15/2023)  Tobacco Use: Medium Risk (04/11/2023)   SDOH Interventions:     Readmission Risk Interventions    12/21/2022    1:57 PM 06/16/2021   10:20 AM  Readmission Risk Prevention Plan  Transportation Screening Complete Complete  PCP or Specialist Appt within 3-5 Days Complete   Social Work Consult for Recovery Care Planning/Counseling Complete   Palliative Care Screening Not Applicable   Medication Review [employer]) Complete Complete  PCP or Specialist appointment within 3-5 days of discharge  Complete  SW Recovery Care/Counseling Consult  Complete  Palliative Care Screening  Not Applicable  Skilled Nursing Facility  Not Applicable

## 2023-04-13 DIAGNOSIS — R569 Unspecified convulsions: Secondary | ICD-10-CM | POA: Diagnosis not present

## 2023-04-13 LAB — CK: Total CK: 1034 U/L — ABNORMAL HIGH (ref 49–397)

## 2023-04-13 NOTE — ED Notes (Signed)
 This tech obtained vital signs on pt and provided pt with snack and beverage.

## 2023-04-13 NOTE — ED Notes (Signed)
Assumed care of pt from Melissa RN

## 2023-04-13 NOTE — Progress Notes (Addendum)
  Progress Note   Patient: [name] ZOX:[id number] DOB: 1960/03/08 DOA: 04/12/2023     0 DOS: the patient was seen and examined on 04/13/2023   Brief hospital course: "[name] is a 63 y.o. male with medical history significant of seizure disorder, history of noncompliant with seizure medications, major depression disorder, HTN, substance abuse, presented with recurrent seizure and suicidal ideation..." See H&P for full HPI on admission & ED course.  Patient was loaded with Keppra in the ED and started on aggressive IV hydration for rhabdomyolysis.     Assessment and Plan:  Seizure secondary to noncoherent with seizure medications Per H&P: "Patient admitted that he only takes Depakote once a day instead of 2 times a day as instructed.  Discussed with patient regarding importance of coherent with antiseizure medication instructions.  Patient appears understanding and agreed." Loaded with 1 g IV Keppra in the ED PLAN: --Continue Depakote 750 mg twice daily.   --Continue L-Carnitine as per neurology recommendation from last admission --Seizure precautions   Rhabdomyolysis - Nontraumatic, suspect due to seizures.  CK peaked at 3835 >> 1034 this AM --Continue IV fluid, UA pending --Trend CK level   Suicidal ideation Recurrent major depression disorder --Psychiatry consulted on admission 4/2 --  " We recommend inpatient psychiatric hospitalization when medically cleared. Patient is under voluntary admission status at this time; please IVC if attempts to leave hospital. " --Expect needs inpatient psychiatry admission after medically stabilized   Sinus tachycardia -Likely postictal, improving      Subjective: Pt seen in ED hall bed this AM. He reports epigastric abdominal pain "in my pancreas" and states both kidneys hurt. Denies dysuria or hematuria, nausea or vomiting.     Physical Exam: Vitals:   04/13/23 0630 04/13/23 0700 04/13/23 0924 04/13/23 1200  BP: 95/71  100/71 102/74 109/76  Pulse: 63 60 71 72  Resp: 13 12 18 13   Temp:   98.2 F (36.8 C)   TempSrc:   Oral   SpO2: 99% 100% 98% 97%  Weight:      Height:       General exam: awake, alert, no acute distress HEENT: moist mucus membranes, hearing grossly normal  Respiratory system: CTAB, no wheezes, rales or rhonchi, normal respiratory effort. Cardiovascular system: normal S1/S2, RRR, no JVD, murmurs, rubs, gallops, no pedal edema.   Gastrointestinal system: soft, NT, ND, no HSM felt, +bowel sounds. Central nervous system: A&O x 3. no gross focal neurologic deficits, normal speech Extremities: moves all, no edema, normal tone Skin: dry, intact, normal temperature Psychiatry: depressed mood, congruent affect   Data Reviewed:  Notable labs --   CK 3835 >> 1034 CMP normal except bicarb 17, glucose 106, gap 16, AST 54,  Normal CBC  Family Communication: None  Disposition: Status is: Observation The patient remains OBS appropriate and will d/c before 2 midnights.  Planned Discharge Destination:  possibly inpatient psych    Time spent: 45 minutes  Author: Pennie Banter, DO 04/13/2023 1:35 PM  For on call review [email or web address].

## 2023-04-13 NOTE — ED Notes (Signed)
 VOL/Pending TOC Placement

## 2023-04-13 NOTE — ED Notes (Signed)
 Pt complaining of LUQ pain, advised he had CT yesterday, pt is getting Tramadol PRN no nausea or vomiting noted. Secure chat send to [facility] NP.

## 2023-04-13 NOTE — ED Notes (Signed)
 Pt resting quietly at this time, respirations equal and unlabored. IVF infusing

## 2023-04-13 NOTE — ED Notes (Signed)
 Pt provided with lunch tray.

## 2023-04-13 NOTE — ED Notes (Signed)
Patient received dinner tray 

## 2023-04-13 NOTE — ED Notes (Signed)
 Pt eating meal tray at this time

## 2023-04-13 NOTE — ED Notes (Signed)
 BP r/t pt sleeping on R side with L arm (cuff arm) in the air.

## 2023-04-14 DIAGNOSIS — R569 Unspecified convulsions: Secondary | ICD-10-CM | POA: Diagnosis not present

## 2023-04-14 LAB — CK: Total CK: 399 U/L — ABNORMAL HIGH (ref 49–397)

## 2023-04-14 LAB — COMPREHENSIVE METABOLIC PANEL WITH GFR
ALT: 15 U/L (ref 0–44)
AST: 25 U/L (ref 15–41)
Albumin: 2.9 g/dL — ABNORMAL LOW (ref 3.5–5.0)
Alkaline Phosphatase: 34 U/L — ABNORMAL LOW (ref 38–126)
Anion gap: 6 (ref 5–15)
BUN: 8 mg/dL (ref 6–20)
CO2: 20 mmol/L — ABNORMAL LOW (ref 22–32)
Calcium: 7.9 mg/dL — ABNORMAL LOW (ref 8.9–10.3)
Chloride: 112 mmol/L — ABNORMAL HIGH (ref 98–111)
Creatinine, Ser: 0.88 mg/dL (ref 0.61–1.24)
GFR, Estimated: >60 mL/min (ref >60)
Glucose, Bld: 88 mg/dL (ref 70–99)
Potassium: 3.7 mmol/L (ref 3.5–5.1)
Sodium: 138 mmol/L (ref 135–145)
Total Bilirubin: 0.6 mg/dL (ref 0.0–1.2)
Total Protein: 5.5 g/dL — ABNORMAL LOW (ref 6.5–8.1)

## 2023-04-14 NOTE — ED Notes (Signed)
 Patient up eating lunch tray

## 2023-04-14 NOTE — Progress Notes (Signed)
  Progress Note   Patient: [name] ZOX:[id number] DOB: 11/24/1960 DOA: 04/12/2023     0 DOS: the patient was seen and examined on 04/14/2023   Brief hospital course: "[name] is a 63 y.o. male with medical history significant of seizure disorder, history of noncompliant with seizure medications, major depression disorder, HTN, substance abuse, presented with recurrent seizure and suicidal ideation..." See H&P for full HPI on admission & ED course.  Patient was loaded with Keppra in the ED and started on aggressive IV hydration for rhabdomyolysis.     Assessment and Plan:  Seizure secondary to noncoherent with seizure medications Per H&P: "Patient admitted that he only takes Depakote once a day instead of 2 times a day as instructed.  Discussed with patient regarding importance of coherent with antiseizure medication instructions.  Patient appears understanding and agreed." Loaded with 1 g IV Keppra in the ED PLAN: --Continue Depakote 750 mg twice daily.   --Continue L-Carnitine as per neurology recommendation from last admission --Seizure precautions   Rhabdomyolysis - Nontraumatic, suspect due to seizures.  CK peaked at 3835 >> 1034 >> 399 (2 pts above normal range) this AM --Stop IV fluids    Suicidal ideation Recurrent major depression disorder --Psychiatry consulted on admission 4/2 --  " We recommend inpatient psychiatric hospitalization when medically cleared. Patient is under voluntary admission status at this time; please IVC if attempts to leave hospital. " --Expect needs inpatient psychiatry admission after medically stabilized   Sinus tachycardia -Likely postictal, improving      Subjective: Pt seen in ED hall bed this AM, still holding for a bed.  He reports his kidneys hurt, when asked what that means he stated "it feels like they're filled up".  Also states pancreas hurts.  No nausea/vomiting and tolerating meals, not requiring  antiemetics..   Physical Exam: Vitals:   04/14/23 0400 04/14/23 0549 04/14/23 0918 04/14/23 1144  BP: 120/74  (!) 127/95 128/88  Pulse: 88  85 79  Resp: 20  18 16   Temp:  98.2 F (36.8 C) (!) 97.4 F (36.3 C)   TempSrc:  Oral Oral   SpO2: 95%  96% 97%  Weight:      Height:       General exam: awake, alert, no acute distress HEENT: moist mucus membranes, hearing grossly normal  Respiratory system: on room air, normal respiratory effort. Cardiovascular system: RRR, no pedal edema.   Gastrointestinal system: soft, NT, ND Central nervous system: A&O x 3. no gross focal neurologic deficits, normal speech Extremities: moves all, no edema, normal tone Skin: dry, intact, normal temperature Psychiatry: depressed mood, congruent affect   Data Reviewed:  Notable labs --   CK 3835 >> 1034 >> 399 CMP normal except bicarb 17, glucose 106, gap 16, AST 54,  Normal CBC  Family Communication: None  Disposition: Status is: Observation The patient remains OBS appropriate and will d/c before 2 midnights.    Planned Discharge Destination:  possibly inpatient psych      Time spent: 45 minutes  Author: Pennie Banter, DO 04/14/2023 12:11 PM  For on call review [email or web address].

## 2023-04-14 NOTE — ED Notes (Signed)
 VOL/Pending Admit

## 2023-04-14 NOTE — Discharge Instructions (Signed)
 Transportation Resources  Agency Name: [facility] Agency Address: [address] [place], [place] [address] Phone: [phone] Email: [email or web address] Website: [email or web address] Service(s) Offered: Housing services, self-sufficiency, congregate meal program, weatherization program, Field seismologist program, emergency food assistance,  housing counseling, home ownership program, wheels-towork program.  Agency Name: [facility] [employer] [phone]) Address: [address] [place], [place] [address] Phone: [phone] Website: [email or web address] Service(s) Offered: Transportation for [employer], subscription and demand response; Dial-a-Ride for citizens 62 years of age or older.  Agency Name: [facility] Address: [address] [place], [place] [address] Phone: [phone] Service(s) Offered: Child support services; child welfare services; food stamps; Medicaid; work first family assistance; and aid with fuel,  rent, food and medicine, transportation assistance.  Agency Name: Disabled [employer] (DAV) Transportation  Network Phone: [phone] Service(s) Offered: Transports veterans to the [facility] medical center. Call  forty-eight hours in advance and leave the name, telephone  number, date, and time of appointment. Veteran will be  contacted by the driver the day before the appointment to  arrange a pick up point   Transportation Resources  Agency Name: [facility] Agency Address: [address] [place], [place] [address] Phone: [phone] Email: [email or web address] Website: [email or web address] Service(s) Offered: Housing services, self-sufficiency, congregate meal program, weatherization program, Field seismologist program, emergency food assistance,  housing counseling, home ownership program, wheels-towork  program.  Agency Name: [facility] [employer] [phone]) Address: [address], [place], [place] [address] Phone: [phone] Website: [email or web address] Service(s) Offered: Transportation for [employer], subscription and demand response; Dial-a-Ride for citizens 33 years of age or older.  Agency Name: Department of Social Services Address: [address] [place], [place] [address] Phone: [phone] Service(s) Offered: Child support services; child welfare services; food stamps; Medicaid; work first family assistance; and aid with fuel,  rent, food and medicine, transportation assistance.  Agency Name: Disabled [employer] (DAV) Transportation  Network Phone: [phone] Service(s) Offered: Transports veterans to the [facility] medical center. Call  forty-eight hours in advance and leave the name, telephone  number, date, and time of appointment. Veteran will be  contacted by the driver the day before the appointment to  arrange a pick up point    [employer]A currently provides door to door services. ACTA connects with PART daily for services to [facility]. ACTA also performs contract services to [employer] operates 27 vehicles, all but 3 mini-vans are equipped with lifts for special needs as well as the general public. ACTA drivers are each CDL certified and trained in First Aid and CPR. ACTA was established in 2002 by [employer]. An independent Industrial/product designer. ACTA operates via Cytogeneticist with required Research scientist (physical sciences) from [place]. ACTA provides over 80,000 passenger trips each year, including [facility] and [employer] sites.  Call at least by 11 AM one business day prior to needing transportation  [employer].                      [place], [place] [address]     Office  Hours: Monday-Friday  8 AM - 5 PM   Agency Name: [facility] Agency Address: [address], [place], [place] [address] Phone: [phone] Website: [email or web address] Service(s) Offered: Housing services, self-sufficiency, congregate meal program, and individual development account program.  Agency Name: [employer] [facility] [place]  Address: [address], [place], [place] [address] Phone: [phone] Email: [email or web address] Website: [email or web address] Service(s) Offered: Housing the homeless, feeding the hungry, Company secretary, job and education related services.  Agency Name: [facility] Address: [address] [place], [place] [address] Phone: [phone] Email: [email or web address] Service(s) Offered: Counseling, problem pregnancy, advocacy for Hispanics, limited emergency financial assistance.  Agency Name: Department of Social Services Address: [address] [place], [place] [address] Phone: [phone] Website: [email or web address] Service(s) Offered: Child support services; child welfare services; SNAP; Medicaid; work first family assistance; and aid with fuel,  rent, food and medicine.  Agency Name: Holiday representative Address: [address] [place], [place] [address] Phone: [phone] or [phone] Email: [email or web address] Service(s) Offered: Family services and transient assistance; emergency food, fuel, clothing, limited furniture, utilities; budget counseling, general counseling; give a kid a coat; thrift store; Christmas food and toys. Utility assistance, food pantry, rental  assistance, life sustaining medicine   the [employer] Aging offers a [employer] that anyone can call toll free at [phone]. The friendship line is available 24 hours a day  [employer] is a Program of All-inclusive Care for the Elderly (PACE). Their mission  is to promote and sustain the independence of seniors wishing to remain in the community. They provide seniors with comprehensive long-term health, social, medical and dietary care. Their program is a safe alternative to nursing home care. [phone]  [facility] Physical Address [facility] [address] [place], [place] [address] Phone: [phone]. . Online zoom yoga class, connect with others without leaving your home [facility] offers [place] dance cardio sessions for individuals via Zoom. This program provides: - Dance fitness activities Please contact program for more information. Servinganyone in need adults 18+ hiv/aids individuals families Call [phone]  Email [email or web address] to get more info  [employer] offers an online [employer] to individuals where they can receive help to focus on their best health. Whether you're a [employer] member or not, the neighborhood center offers a... Main Serviceshealth education  exercise & fitness  community support services  recreation  virtual support Other Servicessupport groups Servinganyone in need adults young adults teens seniors individuals families [email or web address] to get more info  Schedule on their website  The [facility] offers an array of activities for adults age 79 and over. This program provides:- Fitness and health programs- Tech classes- Activity books Main Serviceshealth education  community support services  exercise & fitness  recreation  more education Servingseniors  Call [phone]    For more resources go online to [email or web address] and type in you zipcode

## 2023-04-15 DIAGNOSIS — M19042 Primary osteoarthritis, left hand: Secondary | ICD-10-CM | POA: Diagnosis present

## 2023-04-15 DIAGNOSIS — R45851 Suicidal ideations: Secondary | ICD-10-CM | POA: Diagnosis present

## 2023-04-15 DIAGNOSIS — Z818 Family history of other mental and behavioral disorders: Secondary | ICD-10-CM | POA: Diagnosis not present

## 2023-04-15 DIAGNOSIS — I4891 Unspecified atrial fibrillation: Secondary | ICD-10-CM | POA: Diagnosis present

## 2023-04-15 DIAGNOSIS — Z79899 Other long term (current) drug therapy: Secondary | ICD-10-CM | POA: Diagnosis not present

## 2023-04-15 DIAGNOSIS — I2510 Atherosclerotic heart disease of native coronary artery without angina pectoris: Secondary | ICD-10-CM | POA: Diagnosis present

## 2023-04-15 DIAGNOSIS — I4892 Unspecified atrial flutter: Secondary | ICD-10-CM | POA: Diagnosis present

## 2023-04-15 DIAGNOSIS — E872 Acidosis, unspecified: Secondary | ICD-10-CM | POA: Diagnosis present

## 2023-04-15 DIAGNOSIS — M6282 Rhabdomyolysis: Secondary | ICD-10-CM | POA: Diagnosis present

## 2023-04-15 DIAGNOSIS — Z96653 Presence of artificial knee joint, bilateral: Secondary | ICD-10-CM | POA: Diagnosis present

## 2023-04-15 DIAGNOSIS — Z7901 Long term (current) use of anticoagulants: Secondary | ICD-10-CM | POA: Diagnosis not present

## 2023-04-15 DIAGNOSIS — F319 Bipolar disorder, unspecified: Secondary | ICD-10-CM | POA: Diagnosis present

## 2023-04-15 DIAGNOSIS — Z823 Family history of stroke: Secondary | ICD-10-CM | POA: Diagnosis not present

## 2023-04-15 DIAGNOSIS — R569 Unspecified convulsions: Secondary | ICD-10-CM | POA: Diagnosis not present

## 2023-04-15 DIAGNOSIS — Z5982 Transportation insecurity: Secondary | ICD-10-CM | POA: Diagnosis not present

## 2023-04-15 DIAGNOSIS — G2581 Restless legs syndrome: Secondary | ICD-10-CM | POA: Diagnosis present

## 2023-04-15 DIAGNOSIS — F4310 Post-traumatic stress disorder, unspecified: Secondary | ICD-10-CM | POA: Diagnosis present

## 2023-04-15 DIAGNOSIS — I10 Essential (primary) hypertension: Secondary | ICD-10-CM

## 2023-04-15 DIAGNOSIS — I361 Nonrheumatic tricuspid (valve) insufficiency: Secondary | ICD-10-CM | POA: Diagnosis not present

## 2023-04-15 DIAGNOSIS — F4329 Adjustment disorder with other symptoms: Secondary | ICD-10-CM | POA: Diagnosis present

## 2023-04-15 DIAGNOSIS — I4819 Other persistent atrial fibrillation: Secondary | ICD-10-CM | POA: Diagnosis present

## 2023-04-15 DIAGNOSIS — Z91148 Patient's other noncompliance with medication regimen for other reason: Secondary | ICD-10-CM | POA: Diagnosis not present

## 2023-04-15 DIAGNOSIS — Z9152 Personal history of nonsuicidal self-harm: Secondary | ICD-10-CM | POA: Diagnosis not present

## 2023-04-15 DIAGNOSIS — F1910 Other psychoactive substance abuse, uncomplicated: Secondary | ICD-10-CM | POA: Diagnosis not present

## 2023-04-15 DIAGNOSIS — G40909 Epilepsy, unspecified, not intractable, without status epilepticus: Secondary | ICD-10-CM | POA: Diagnosis present

## 2023-04-15 DIAGNOSIS — Z87442 Personal history of urinary calculi: Secondary | ICD-10-CM | POA: Diagnosis not present

## 2023-04-15 DIAGNOSIS — Z87891 Personal history of nicotine dependence: Secondary | ICD-10-CM | POA: Diagnosis not present

## 2023-04-15 DIAGNOSIS — F6089 Other specific personality disorders: Secondary | ICD-10-CM | POA: Diagnosis present

## 2023-04-15 MED ORDER — AMIODARONE HCL IN DEXTROSE 360-4.14 MG/200ML-% IV SOLN
30.0000 mg/h | INTRAVENOUS | Status: DC
  Administered 2023-04-15 – 2023-04-17 (×4): 30 mg/h via INTRAVENOUS
  Filled 2023-04-15 (×4): qty 200

## 2023-04-15 MED ORDER — AMIODARONE LOAD VIA INFUSION
150.0000 mg | Freq: Once | INTRAVENOUS | Status: AC
  Administered 2023-04-15: 150 mg via INTRAVENOUS
  Filled 2023-04-15: qty 83.34

## 2023-04-15 MED ORDER — AMIODARONE HCL IN DEXTROSE 360-4.14 MG/200ML-% IV SOLN
60.0000 mg/h | INTRAVENOUS | Status: DC
  Administered 2023-04-15 (×2): 60 mg/h via INTRAVENOUS
  Filled 2023-04-15 (×2): qty 200

## 2023-04-15 MED ORDER — METOPROLOL TARTRATE 50 MG PO TABS
50.0000 mg | ORAL_TABLET | Freq: Two times a day (BID) | ORAL | Status: DC
  Administered 2023-04-15 – 2023-04-17 (×6): 50 mg via ORAL
  Filled 2023-04-15 (×6): qty 1

## 2023-04-15 MED ORDER — ASPIRIN 81 MG PO TBEC
81.0000 mg | DELAYED_RELEASE_TABLET | Freq: Every day | ORAL | Status: DC
  Administered 2023-04-15 – 2023-04-16 (×2): 81 mg via ORAL
  Filled 2023-04-15 (×2): qty 1

## 2023-04-15 MED ORDER — ORAL CARE MOUTH RINSE: 15.0000 mL | OROMUCOSAL | Status: DC | PRN

## 2023-04-15 NOTE — TOC Transition Note (Signed)
 Transition of Care [facility]) - Discharge Note   Patient Details  Name: [name] MRN: [id number] Date of Birth: 1960/04/05  Transition of Care [facility]) CM/SW Contact:  Elberta Fortis, RN Phone Number: 04/15/2023, 11:09 AM   Clinical Narrative:    Pt is still in the ED hall, waiting for inpatient psych placement. TOC to continue to follow.      Barriers to Discharge: Barriers Resolved   Patient Goals and CMS Choice Patient states their goals for this hospitalization and ongoing recovery are:: "To find a new place to live"          Discharge Placement                       Discharge Plan and Services Additional resources added to the After Visit Summary for                                       Social Drivers of Health (SDOH) Interventions SDOH Screenings   Food Insecurity: No Food Insecurity (04/13/2023)  Housing: Low Risk  (04/13/2023)  Transportation Needs: No Transportation Needs (04/13/2023)  Recent Concern: Transportation Needs - Unmet Transportation Needs (02/14/2023)  Utilities: Not At Risk (04/13/2023)  Recent Concern: Utilities - At Risk (02/14/2023)  Alcohol Screen: Low Risk  (02/15/2023)  Social Connections: Socially Isolated (02/15/2023)  Tobacco Use: Medium Risk (04/11/2023)     Readmission Risk Interventions    12/21/2022    1:57 PM 06/16/2021   10:20 AM  Readmission Risk Prevention Plan  Transportation Screening Complete Complete  PCP or Specialist Appt within 3-5 Days Complete   Social Work Consult for Recovery Care Planning/Counseling Complete   Palliative Care Screening Not Applicable   Medication Review Oceanographer) Complete Complete  PCP or Specialist appointment within 3-5 days of discharge  Complete  SW Recovery Care/Counseling Consult  Complete  Palliative Care Screening  Not Applicable  Skilled Nursing Facility  Not Applicable

## 2023-04-15 NOTE — ED Notes (Signed)
 Requested oral care wash from 1a.

## 2023-04-15 NOTE — ED Notes (Signed)
 Oral care wash still not available.

## 2023-04-15 NOTE — Progress Notes (Addendum)
  Progress Note   Patient: [name] DOB: 1960/06/03 DOA: 04/12/2023     0 DOS: the patient was seen and examined on 04/15/2023   Brief hospital course: "[name] is a 63 y.o. male with medical history significant of seizure disorder, history of noncompliant with seizure medications, major depression disorder, HTN, substance abuse, presented with recurrent seizure and suicidal ideation..." See H&P for full HPI on admission & ED course.  Patient was loaded with Keppra in the ED and started on aggressive IV hydration for rhabdomyolysis.     Assessment and Plan:  Seizure secondary to noncoherent with seizure medications Per H&P: "Patient admitted that he only takes Depakote once a day instead of 2 times a day as instructed.  Discussed with patient regarding importance of coherent with antiseizure medication instructions.  Patient appears understanding and agreed." Loaded with 1 g IV Keppra in the ED PLAN: --Continue Depakote 750 mg twice daily.   --Continue L-Carnitine as per neurology recommendation from last admission --Seizure precautions  A-fib/flutter with RVR - not POA. 4/5 - HR's in 140's despite metoprolol. History of admission for same, difficult to control HR's --Admit to Progressive --Start amiodarone drip --Not on anticoagulation, CHA2DS2-VASc 1  --Cardiology consulted   Rhabdomyolysis - Nontraumatic, suspect due to seizures.  CK peaked at 3835 >> 1034 >> 399 (2 pts above normal range) this AM --Stopped IV fluids    Suicidal ideation Recurrent major depression disorder --Psychiatry consulted on admission 4/2 --  " We recommend inpatient psychiatric hospitalization when medically cleared. Patient is under voluntary admission status at this time; please IVC if attempts to leave hospital. " --Inpatient psychiatry admission recommended after medically stabilized   Sinus tachycardia --Likely postictal, improving      Subjective: Pt seen in  ED hall bed this AM, still holding for a bed.  He reports his kidneys hurt, when asked what that means he stated "it feels like they're filled up".  Also states pancreas hurts.  No nausea/vomiting and tolerating meals, not requiring antiemetics.   Physical Exam: Vitals:   04/15/23 1129 04/15/23 1130 04/15/23 1230 04/15/23 1300  BP: (!) 115/102 (!) 115/102 (!) 120/103 (!) 121/102  Pulse: 85     Resp: 14 12 17 14   Temp: 97.9 F (36.6 C)     TempSrc: Oral     SpO2: 97%     Weight:      Height:       General exam: awake, alert, no acute distress HEENT: moist mucus membranes, hearing grossly normal  Respiratory system: on room air, normal respiratory effort. Cardiovascular system: irregularly irregular, +S1/S2, no pedal edema.   Gastrointestinal system: soft, NT, ND Central nervous system: A&O x 3. no gross focal neurologic deficits, normal speech Extremities: moves all, no edema, normal tone Skin: dry, intact, normal temperature Psychiatry: depressed mood, congruent affect   Data Reviewed:  Notable labs 4/4 --   CMP normal except bicarb 20, Ca 7.9, alk phos 34,   CK 3835 >> 1034 >> 399  Family Communication: None    Disposition: Status is: Inpatient Remains inpatient appropriate because: HR's uncontrolled, requiring IV therapies for A-fib/flutter with RVR, cardiology consult pending.      Planned Discharge Destination:  inpatient psych      Time spent: 45 minutes  Author: Pennie Banter, DO 04/15/2023 1:03 PM  For on call review [email or web address].

## 2023-04-15 NOTE — Consult Note (Signed)
 Cardiology Consultation   Patient ID: [name] MRN: [id number]; DOB: 09/07/1960  Admit date: 04/12/2023 Date of Consult: 04/15/2023  PCP:  [name], PA   [facility] Providers Cardiologist:  Yvonne Kendall, MD        Patient Profile:   [name] is a 63 y.o. male with a hx of A-fib, hypertension, seizure who is being seen 04/15/2023 for the evaluation of A-fib RVR at the request of Dr. Denton Lank.  History of Present Illness:   [name] is a 63 year old male with history of hypertension, paroxysmal atrial fibrillation, seizure disorder, depression presenting to the hospital due to seizure activity.  Patient endorsed not taking seizure medications as prescribed, apparently had 4 seizures 3 days ago prompting him to come to the emergency room.  EKG on admission was sinus.  Also had suicidal ideation requiring inpatient psych consult.  Hospital course complicated by irregular heart rhythm and palpitations today, EKG showing A-fib with RVR heart rate 132.  Of note he had a similar episode during his admission 3 months ago.  At the time, was given amiodarone drip with conversion to sinus rhythm.  Patient was started on metoprolol.  No anticoagulation given due to low CHADS2 Vasc of 1  Started on amiodarone drip after diagnosis of atrial fibrillation, heart rate overall has improved although still in A-fib.  Patient states feeling much better, palpitations improved.  No seizure activity over the past 24 to 48 hours.   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 echo 5/17: EF 60-65%, no RWMA, LV dias fxn nl, LA mildly dilated,  RV sys fxn nl, PASP nl, moderate sized circumferential pericardial effusion was identified, 2.12 cm around the LV free wall, <1 cm around the RV free wall. Features were not c/w tamponade physiology   PTSD (post-traumatic stress disorder)    Witnessed brother's suicide.   Restless leg syndrome    Seizures (HCC)    Syncope     Past Surgical History:  Procedure Laterality Date   APPENDECTOMY     CYSTOSCOPY WITH URETEROSCOPY AND STENT PLACEMENT     ESOPHAGOGASTRODUODENOSCOPY N/A 01/11/2016   Procedure: ESOPHAGOGASTRODUODENOSCOPY (EGD);  Surgeon: Charlott Rakes, MD;  Location: [facility] ENDOSCOPY;  Service: Endoscopy;  Laterality: N/A;   ESOPHAGOGASTRODUODENOSCOPY N/A 04/09/2020   Procedure: ESOPHAGOGASTRODUODENOSCOPY (EGD);  Surgeon: [name], MD;  Location: [facility] ENDOSCOPY;  Service: Gastroenterology;  Laterality: N/A;   INCISION AND DRAINAGE ABSCESS Left 01/02/2018   Procedure: INCISION AND DRAINAGE LEFT KNEE;  Surgeon: Deeann Saint, MD;  Location: [facility] ORS;  Service: Orthopedics;  Laterality: Left;   JOINT REPLACEMENT Right    TKR   KNEE ARTHROSCOPY Right 06/25/2014   Procedure: ARTHROSCOPY KNEE;  Surgeon: Deeann Saint, MD;  Location: [facility] ORS;  Service: Orthopedics;  Laterality: Right;  partial arthroscopic medial menisectomy   LAPAROSCOPIC APPENDECTOMY N/A 06/02/2021   Procedure: APPENDECTOMY LAPAROSCOPIC;  Surgeon: Campbell Lerner, MD;  Location: [facility] ORS;  Service: General;  Laterality: N/A;   TOTAL KNEE ARTHROPLASTY Right 04/22/2015   Procedure: TOTAL KNEE ARTHROPLASTY;  Surgeon: Deeann Saint, MD;  Location: [facility] ORS;  Service: Orthopedics;  Laterality: Right;   TOTAL KNEE ARTHROPLASTY Left 10/30/2017   Procedure: TOTAL KNEE ARTHROPLASTY;  Surgeon: Deeann Saint, MD;  Location: [facility] ORS;  Service: Orthopedics;  Laterality: Left;   TOTAL KNEE REVISION Left 01/02/2018   Procedure: poly exchange of tibia and patella left knee;  Surgeon: Deeann Saint, MD;  Location: [facility] ORS;   Service: Orthopedics;  Laterality: Left;   UMBILICAL HERNIA REPAIR  06/02/2021   Procedure: HERNIA REPAIR UMBILICAL ADULT;  Surgeon: Campbell Lerner, MD;  Location: [facility] ORS;  Service: General;;     Home Medications:  Prior to Admission medications   Medication Sig Start Date End Date Taking? Authorizing Provider  divalproex (DEPAKOTE) 250 MG DR tablet Take 3 tablets (750 mg total) by mouth 2 (two) times daily. 03/07/23  Yes [name], MD  doxepin (SINEQUAN) 50 MG capsule Take 1 capsule (50 mg total) by mouth at bedtime. 03/07/23  Yes [name], MD  levOCARNitine (CARNITOR) 1 GM/10ML solution Take 5 mLs (500 mg total) by mouth daily. 02/15/23  Yes Arnetha Courser, MD  lurasidone (LATUDA) 20 MG TABS tablet Take 4 tablets (80 mg total) by mouth daily with supper. Patient taking differently: Take 20 mg by mouth daily with supper. 03/07/23 04/12/23 Yes [name], MD  melatonin 5 MG TABS Take 1 tablet (5 mg total) by mouth at bedtime. 03/07/23  Yes [name], MD  metoprolol succinate (TOPROL-XL) 50 MG 24 hr tablet Take 1 tablet (50 mg total) by mouth daily. 03/08/23  Yes [name], MD  Multiple Vitamin (MULTIVITAMIN WITH MINERALS) TABS tablet Take 1 tablet by mouth daily. 03/08/23  Yes [name], MD  tamsulosin (FLOMAX) 0.4 MG CAPS capsule Take 1 capsule (0.4 mg total) by mouth daily after supper. 12/22/22 06/20/23 Yes Gillis Santa, MD  traMADol (ULTRAM) 50 MG tablet Take 1 tablet (50 mg total) by mouth every 6 (six) hours as needed for severe pain (pain score 7-10). 03/07/23  Yes [name], MD  venlafaxine XR (EFFEXOR-XR) 150 MG 24 hr capsule Take 1 capsule (150 mg total) by mouth daily after breakfast. 03/08/23  Yes [name], MD  feeding supplement (ENSURE ENLIVE / ENSURE PLUS) LIQD Take 237 mLs by mouth 3 (three) times daily between meals. 03/07/23   [name], MD    Inpatient Medications: Scheduled Meds:  divalproex  750 mg Oral BID   doxepin  50 mg  Oral QHS   enoxaparin (LOVENOX) injection  40 mg Subcutaneous Q24H   feeding supplement  237 mL Oral TID BM   levOCARNitine  500 mg Oral Daily   lurasidone  80 mg Oral Q supper   melatonin  5 mg Oral QHS   metoprolol succinate  50 mg Oral Daily   mouth rinse  15 mL Mouth Rinse Q2H   sodium chloride flush  3-10 mL Intravenous Q12H   tamsulosin  0.4 mg Oral QPC supper   venlafaxine XR  150 mg Oral QPC breakfast   Continuous Infusions:  amiodarone 60 mg/hr (04/15/23 1321)   Followed by   amiodarone     PRN Meds: ondansetron **OR** ondansetron (ZOFRAN) IV, mouth rinse, sodium chloride flush, traMADol  Allergies:    Allergies  Allergen Reactions   Ketorolac Tromethamine Hives and Rash    Other Reaction(s): Not available    Social History:   Social History   Socioeconomic History   Marital status: Single    Spouse name: Not on file  Number of children: Not on file   Years of education: Not on file   Highest education level: Not on file  Occupational History   Not on file  Tobacco Use   Smoking status: Former    Current packs/day: 0.00    Average packs/day: 0.8 packs/day for 20.0 years (15.0 ttl pk-yrs)    Types: Cigarettes    Start date: 05/16/1964    Quit date: 05/16/1984    Years since quitting: 38.9   Smokeless tobacco: Never  Vaping Use   Vaping status: Never Used  Substance and Sexual Activity   Alcohol use: Not Currently    Comment: rare   Drug use: Yes    Types: Marijuana, Cocaine    Comment: last use January 2025, cocaine   Sexual activity: Not Currently  Other Topics Concern   Not on file  Social History Narrative   ** Merged History Encounter **       Social Drivers of Health   Financial Resource Strain: Not on file  Food Insecurity: No Food Insecurity (04/13/2023)   Hunger Vital Sign    Worried About Running Out of Food in the Last Year: Never true    Ran Out of Food in the Last Year: Never true  Transportation Needs: No Transportation Needs  (04/13/2023)   PRAPARE - Administrator, Civil Service (Medical): No    Lack of Transportation (Non-Medical): No  Recent Concern: Transportation Needs - Unmet Transportation Needs (02/14/2023)   PRAPARE - Administrator, Civil Service (Medical): Yes    Lack of Transportation (Non-Medical): Yes  Physical Activity: Not on file  Stress: Not on file  Social Connections: Socially Isolated (02/15/2023)   Social Connection and Isolation Panel [NHANES]    Frequency of Communication with Friends and Family: Once a week    Frequency of Social Gatherings with Friends and Family: Once a week    Attends Religious Services: Never    Database administrator or Organizations: No    Attends Banker Meetings: Never    Marital Status: Never married  Intimate Partner Violence: Not At Risk (04/13/2023)   Humiliation, Afraid, Rape, and Kick questionnaire    Fear of Current or Ex-Partner: No    Emotionally Abused: No    Physically Abused: No    Sexually Abused: No    Family History:    Family History  Problem Relation Age of Onset   CVA Mother        deceased at age 19   Depression Brother        Died by suicide at age 60     ROS:  Please see the history of present illness.   All other ROS reviewed and negative.     Physical Exam/Data:   Vitals:   04/15/23 1130 04/15/23 1230 04/15/23 1300 04/15/23 1353  BP: (!) 115/102 (!) 120/103 (!) 121/102 (!) 127/90  Pulse:    95  Resp: 12 17 14 16   Temp:    97.6 F (36.4 C)  TempSrc:    Oral  SpO2:    97%  Weight:      Height:       No intake or output data in the 24 hours ending 04/15/23 1444    04/11/2023   11:44 PM 02/12/2023    4:49 PM 12/23/2022    3:23 PM  Last 3 Weights  Weight (lbs) 165 lb 165 lb   Weight (kg) 74.844 kg 74.844 kg  Information is confidential and restricted. Go to Review Flowsheets to unlock data.     Body mass index is 24.37 kg/m.  General:  Well nourished, well developed, in no  acute distress HEENT: normal Neck: no JVD Vascular: No carotid bruits; Distal pulses 2+ bilaterally Cardiac: Irregular irregular rhythm Lungs:  clear to auscultation bilaterally, no wheezing, rhonchi or rales  Abd: soft, nontender, no hepatomegaly  Ext: no edema Musculoskeletal:  No deformities, BUE and BLE strength normal and equal Skin: warm and dry  Neuro:  CNs 2-12 intact, no focal abnormalities noted Psych:  Normal affect   EKG:  The EKG was personally reviewed and demonstrates: Atrial fibrillation heart rate 132 Telemetry:  Telemetry was personally reviewed and demonstrates: Atrial fibrillation heart rate 97-108  Relevant CV Studies: TTE 12/2022 1. Left ventricular ejection fraction, by estimation, is 60 to 65%. The  left ventricle has normal function. The left ventricle has no regional  wall motion abnormalities. There is moderate left ventricular hypertrophy.  Left ventricular diastolic  parameters were normal.   2. Right ventricular systolic function is normal. The right ventricular  size is normal. There is normal pulmonary artery systolic pressure.   3. Left atrial size was mildly dilated.   4. The mitral valve is normal in structure. Mild to moderate mitral valve  regurgitation. No evidence of mitral stenosis.   5. The aortic valve is normal in structure. There is mild calcification  of the aortic valve. Aortic valve regurgitation is not visualized. Aortic  valve sclerosis is present, with no evidence of aortic valve stenosis.  Aortic valve mean gradient measures  3.0 mmHg.   Laboratory Data:  High Sensitivity Troponin:   Recent Labs  Lab 04/12/23 0121 04/12/23 0257  TROPONINIHS 43* 45*     Chemistry Recent Labs  Lab 04/12/23 0121 04/14/23 0530  NA 138 138  K 4.9 3.7  CL 105 112*  CO2 17* 20*  GLUCOSE 106* 88  BUN 21* 8  CREATININE 1.10 0.88  CALCIUM 9.9 7.9*  MG 1.9  --   GFRNONAA >60 >60  ANIONGAP 16* 6    Recent Labs  Lab 04/12/23 0121  04/14/23 0530  PROT 8.2* 5.5*  ALBUMIN 4.7 2.9*  AST 54* 25  ALT 20 15  ALKPHOS 57 34*  BILITOT 0.6 0.6   Lipids No results for input(s): "CHOL", "TRIG", "HDL", "LABVLDL", "LDLCALC", "CHOLHDL" in the last 168 hours.  Hematology Recent Labs  Lab 04/12/23 0257  WBC 8.0  RBC 4.66  HGB 14.2  HCT 40.4  MCV 86.7  MCH 30.5  MCHC 35.1  RDW 13.8  PLT 160   Thyroid No results for input(s): "TSH", "FREET4" in the last 168 hours.  BNPNo results for input(s): "BNP", "PROBNP" in the last 168 hours.  DDimer No results for input(s): "DDIMER" in the last 168 hours.   Radiology/Studies:  CT T-SPINE NO CHARGE Result Date: 04/12/2023 CLINICAL DATA:  Blunt polytrauma, seizure EXAM: CT Thoracic and Lumbar spine with contrast TECHNIQUE: Multiplanar CT images of the thoracic and lumbar spine were reconstructed from contemporary CT of the Chest, Abdomen, and Pelvis. RADIATION DOSE REDUCTION: This exam was performed according to the departmental dose-optimization program which includes automated exposure control, adjustment of the mA and/or kV according to patient size and/or use of iterative reconstruction technique. CONTRAST:  No additional contrast was administered for creation of these reformats COMPARISON:  None Available. FINDINGS: CT THORACIC SPINE FINDINGS Alignment: Normal. Vertebrae: No acute fracture or focal pathologic process. Paraspinal and  other soft tissues: Negative. Disc levels: Intervertebral disc heights are preserved. Spinal canal is widely patent. CT LUMBAR SPINE FINDINGS Segmentation: 5 lumbar type vertebrae. Alignment: Normal. Vertebrae: No acute fracture or focal pathologic process. Paraspinal and other soft tissues: Negative. Disc levels: Intervertebral disc heights are preserved. Spinal canal is widely patent. No significant neuroforaminal narrowing. IMPRESSION: 1. Normal CT examination of the thoracic and lumbar spine. Electronically Signed   By: Helyn Numbers M.D.   On: 04/12/2023  03:20   CT L-SPINE NO CHARGE Result Date: 04/12/2023 CLINICAL DATA:  Blunt polytrauma, seizure EXAM: CT Thoracic and Lumbar spine with contrast TECHNIQUE: Multiplanar CT images of the thoracic and lumbar spine were reconstructed from contemporary CT of the Chest, Abdomen, and Pelvis. RADIATION DOSE REDUCTION: This exam was performed according to the departmental dose-optimization program which includes automated exposure control, adjustment of the mA and/or kV according to patient size and/or use of iterative reconstruction technique. CONTRAST:  No additional contrast was administered for creation of these reformats COMPARISON:  None Available. FINDINGS: CT THORACIC SPINE FINDINGS Alignment: Normal. Vertebrae: No acute fracture or focal pathologic process. Paraspinal and other soft tissues: Negative. Disc levels: Intervertebral disc heights are preserved. Spinal canal is widely patent. CT LUMBAR SPINE FINDINGS Segmentation: 5 lumbar type vertebrae. Alignment: Normal. Vertebrae: No acute fracture or focal pathologic process. Paraspinal and other soft tissues: Negative. Disc levels: Intervertebral disc heights are preserved. Spinal canal is widely patent. No significant neuroforaminal narrowing. IMPRESSION: 1. Normal CT examination of the thoracic and lumbar spine. Electronically Signed   By: Helyn Numbers M.D.   On: 04/12/2023 03:20   CT HEAD WO CONTRAST ( ) Result Date: 04/12/2023 CLINICAL DATA:  Head trauma and seizure EXAM: CT HEAD WITHOUT CONTRAST CT CERVICAL SPINE WITHOUT CONTRAST TECHNIQUE: Multidetector CT imaging of the head and cervical spine was performed following the standard protocol without intravenous contrast. Multiplanar CT image reconstructions of the cervical spine were also generated. RADIATION DOSE REDUCTION: This exam was performed according to the departmental dose-optimization program which includes automated exposure control, adjustment of the mA and/or kV according to patient size  and/or use of iterative reconstruction technique. COMPARISON:  None Available. FINDINGS: CT HEAD FINDINGS Brain: No mass,hemorrhage or extra-axial collection. Normal appearance of the parenchyma and CSF spaces. Vascular: Atherosclerotic calcification of the internal carotid arteries at the skull base. No abnormal hyperdensity of the major intracranial arteries or dural venous sinuses. Skull: The visualized skull base, calvarium and extracranial soft tissues are normal. Sinuses/Orbits: No fluid levels or advanced mucosal thickening of the visualized paranasal sinuses. No mastoid or middle ear effusion. Normal orbits. Other: None. CT CERVICAL SPINE FINDINGS Alignment: Grade 1 anterolisthesis at C3-4. Facets are aligned. Occipital condyles are normally positioned. Skull base and vertebrae: No acute fracture. Soft tissues and spinal canal: No prevertebral fluid or swelling. No visible canal hematoma. Disc levels: No advanced spinal canal or neural foraminal stenosis. Upper chest: No pneumothorax, pulmonary nodule or pleural effusion. Other: Normal visualized paraspinal cervical soft tissues. IMPRESSION: 1. No acute intracranial abnormality. 2. No acute fracture or traumatic subluxation of the cervical spine. Electronically Signed   By: Deatra Robinson M.D.   On: 04/12/2023 03:14   CT Cervical Spine Wo Contrast Result Date: 04/12/2023 CLINICAL DATA:  Head trauma and seizure EXAM: CT HEAD WITHOUT CONTRAST CT CERVICAL SPINE WITHOUT CONTRAST TECHNIQUE: Multidetector CT imaging of the head and cervical spine was performed following the standard protocol without intravenous contrast. Multiplanar CT image reconstructions of the cervical spine were  also generated. RADIATION DOSE REDUCTION: This exam was performed according to the departmental dose-optimization program which includes automated exposure control, adjustment of the mA and/or kV according to patient size and/or use of iterative reconstruction technique. COMPARISON:   None Available. FINDINGS: CT HEAD FINDINGS Brain: No mass,hemorrhage or extra-axial collection. Normal appearance of the parenchyma and CSF spaces. Vascular: Atherosclerotic calcification of the internal carotid arteries at the skull base. No abnormal hyperdensity of the major intracranial arteries or dural venous sinuses. Skull: The visualized skull base, calvarium and extracranial soft tissues are normal. Sinuses/Orbits: No fluid levels or advanced mucosal thickening of the visualized paranasal sinuses. No mastoid or middle ear effusion. Normal orbits. Other: None. CT CERVICAL SPINE FINDINGS Alignment: Grade 1 anterolisthesis at C3-4. Facets are aligned. Occipital condyles are normally positioned. Skull base and vertebrae: No acute fracture. Soft tissues and spinal canal: No prevertebral fluid or swelling. No visible canal hematoma. Disc levels: No advanced spinal canal or neural foraminal stenosis. Upper chest: No pneumothorax, pulmonary nodule or pleural effusion. Other: Normal visualized paraspinal cervical soft tissues. IMPRESSION: 1. No acute intracranial abnormality. 2. No acute fracture or traumatic subluxation of the cervical spine. Electronically Signed   By: Deatra Robinson M.D.   On: 04/12/2023 03:14   CT CHEST ABDOMEN PELVIS W CONTRAST Result Date: 04/12/2023 CLINICAL DATA:  Polytrauma, blunt, seizure, right flank pain, right chest pain EXAM: CT CHEST, ABDOMEN, AND PELVIS WITH CONTRAST TECHNIQUE: Multidetector CT imaging of the chest, abdomen and pelvis was performed following the standard protocol during bolus administration of intravenous contrast. RADIATION DOSE REDUCTION: This exam was performed according to the departmental dose-optimization program which includes automated exposure control, adjustment of the mA and/or kV according to patient size and/or use of iterative reconstruction technique. CONTRAST:  OMNIPAQUE IOHEXOL 300 MG/ML  SOLN COMPARISON:  None Available. FINDINGS: CT CHEST  FINDINGS Cardiovascular: Extensive multi-vessel coronary artery calcification. Global cardiac size within limits. No pericardial effusion. Central pulmonary arteries are of normal caliber. Thoracic aorta is unremarkable. Mediastinum/Nodes: No enlarged mediastinal, hilar, or axillary lymph nodes. Thyroid gland, trachea, and esophagus demonstrate no significant findings. Lungs/Pleura: Mild right parenchymal scarring. Bibasilar dependent atelectasis. No confluent pulmonary infiltrate. No pneumothorax or pleural effusion. No central obstructing lesion. Musculoskeletal: No chest wall mass or suspicious bone lesions identified. CT ABDOMEN PELVIS FINDINGS Hepatobiliary: Cholelithiasis without superimposed pericholecystic inflammatory change. Liver unremarkable; no enhancing intrahepatic mass identified. No intra or extrahepatic biliary ductal dilation. Pancreas: Unremarkable Spleen: Unremarkable Adrenals/Urinary Tract: Adrenal glands are unremarkable. Simple cortical cysts are seen within the kidneys bilaterally for which no follow-up imaging is recommended. Moderate cortical scarring bilaterally. The kidneys are otherwise unremarkable. Bladder unremarkable. Stomach/Bowel: Stomach is within normal limits. Appendix absent. No evidence of bowel wall thickening, distention, or inflammatory changes. Vascular/Lymphatic: Aortic atherosclerosis. No enlarged abdominal or pelvic lymph nodes. Reproductive: Prostate is unremarkable. Other: Tiny fat containing umbilical hernia Musculoskeletal: No acute or significant osseous findings. IMPRESSION: 1. No acute intrathoracic or intra-abdominal injury identified. 2. Extensive multi-vessel coronary artery calcification. 3. Cholelithiasis. Aortic Atherosclerosis (ICD10-I70.0). Electronically Signed   By: Helyn Numbers M.D.   On: 04/12/2023 03:10   DG Chest 1 View Result Date: 04/12/2023 CLINICAL DATA:  Chest pain EXAM: CHEST  1 VIEW COMPARISON:  12/20/2022 FINDINGS: The heart size and  mediastinal contours are within normal limits. Both lungs are clear. The visualized skeletal structures are unremarkable. IMPRESSION: No active disease. Electronically Signed   By: Charlett Nose M.D.   On: 04/12/2023 00:22  Assessment and Plan:   A-fib RVR - Currently in A-fib, improved heart rates. - Continue amiodarone drip, - Switch from Toprol-XL to Lopressor 50 mg twice daily for added heart rate control. - CHADS2 Vasc 1, start aspirin 81 mg daily. - Due to seizure medication noncompliance, risk of falls, low CHA2DS2-VASc, avoid NOAC. - If patient converts to sinus rhythm, plan to switch IV amiodarone to oral. - Normal EF  2.  Hypertension - Lopressor 50 mg twice daily.  3.  Seizure activity, depression. - Management as per medicine team    Signed, Debbe Odea, MD  04/15/2023 2:44 PM

## 2023-04-16 DIAGNOSIS — I4891 Unspecified atrial fibrillation: Secondary | ICD-10-CM | POA: Diagnosis not present

## 2023-04-16 DIAGNOSIS — I10 Essential (primary) hypertension: Secondary | ICD-10-CM | POA: Diagnosis not present

## 2023-04-16 DIAGNOSIS — R569 Unspecified convulsions: Secondary | ICD-10-CM | POA: Diagnosis not present

## 2023-04-16 LAB — BASIC METABOLIC PANEL WITH GFR
Anion gap: 9 (ref 5–15)
BUN: 15 mg/dL (ref 6–20)
CO2: 23 mmol/L (ref 22–32)
Calcium: 9.4 mg/dL (ref 8.9–10.3)
Chloride: 106 mmol/L (ref 98–111)
Creatinine, Ser: 0.94 mg/dL (ref 0.61–1.24)
GFR, Estimated: >60 mL/min (ref >60)
Glucose, Bld: 85 mg/dL (ref 70–99)
Potassium: 3.8 mmol/L (ref 3.5–5.1)
Sodium: 138 mmol/L (ref 135–145)

## 2023-04-16 LAB — MAGNESIUM: Magnesium: 2 mg/dL (ref 1.7–2.4)

## 2023-04-16 MED ORDER — SODIUM CHLORIDE 0.9% FLUSH: 3.0000 mL | INTRAVENOUS | Status: DC | PRN

## 2023-04-16 MED ORDER — RIVAROXABAN 20 MG PO TABS
20.0000 mg | ORAL_TABLET | Freq: Every day | ORAL | Status: DC
  Administered 2023-04-16 – 2023-04-17 (×2): 20 mg via ORAL
  Filled 2023-04-16 (×2): qty 1

## 2023-04-16 MED ORDER — SODIUM CHLORIDE 0.9% FLUSH
3.0000 mL | Freq: Two times a day (BID) | INTRAVENOUS | Status: DC
Start: 2023-04-16 — End: 2023-04-17
  Administered 2023-04-17: 10 mL via INTRAVENOUS

## 2023-04-16 NOTE — Progress Notes (Signed)
   Patient Name: [name] [name] Date of Encounter: 04/16/2023 [facility] Cardiologist: Yvonne Kendall, MD   Interval Summary  .    Patient remains in Afib with conrtolled rates. Says he feels heart flutter. He has chest pain with inspiration. No SOB.  Vital Signs .    Vitals:   04/15/23 2217 04/15/23 2349 04/16/23 0529 04/16/23 0836  BP:  (!) 140/95 116/74 136/83  Pulse: 76 (!) 44 (!) 44 (!) 58  Resp:  18 18   Temp:  (!) 97.4 F (36.3 C) (!) 97.3 F (36.3 C) (!) 97.5 F (36.4 C)  TempSrc:      SpO2:  97% 92% 95%  Weight:      Height:        Intake/Output Summary (Last 24 hours) at 04/16/2023 0854 Last data filed at 04/15/2023 1949 Gross per 24 hour  Intake 409.66 ml  Output --  Net 409.66 ml      04/11/2023   11:44 PM 02/12/2023    4:49 PM 12/23/2022    3:23 PM  Last 3 Weights  Weight (lbs) 165 lb 165 lb   Weight (kg) 74.844 kg 74.844 kg      Information is confidential and restricted. Go to Review Flowsheets to unlock data.      Telemetry/ECG    Afib HR 80-90s, occasional HR elevation - Personally Reviewed  Physical Exam .   GEN: No acute distress.   Neck: No JVD Cardiac: Irreg Irreg, no murmurs, rubs, or gallops.  Respiratory: Clear to auscultation bilaterally. GI: Soft, nontender, non-distended  MS: No edema  Assessment & Plan .    Afib RVR - in afib with controlled rates - continue IV amiodarone - continue Lopressor 50mg  BID - CHADSVASC 1 - no plan for NOAC due to noncompliance, risk of falls and low CHADSVASC - Echo showed normal EF - if he does not medically convert may need cardioversion and use of NOAC short term  HTN  - lopressor 50mg  BID - blood pressure good  Seizure activity - due to noncompliance - per IM  For questions or updates, please contact [facility] Please consult [email or web address] for contact info under        Signed, Oyuki Hogan David Stall, PA-C

## 2023-04-16 NOTE — Progress Notes (Signed)
  Progress Note   Patient: [name] BJY:[id number] DOB: April 16, 1960 DOA: 04/12/2023     1 DOS: the patient was seen and examined on 04/16/2023   Brief hospital course: "[name] is a 63 y.o. male with medical history significant of seizure disorder, history of noncompliant with seizure medications, major depression disorder, HTN, substance abuse, presented with recurrent seizure and suicidal ideation..." See H&P for full HPI on admission & ED course.  Patient was loaded with Keppra in the ED and started on aggressive IV hydration for rhabdomyolysis.     Assessment and Plan:  Seizure secondary to noncoherent with seizure medications Per H&P: "Patient admitted that he only takes Depakote once a day instead of 2 times a day as instructed.  Discussed with patient regarding importance of coherent with antiseizure medication instructions.  Patient appears understanding and agreed." Loaded with 1 g IV Keppra in the ED PLAN: --Continue Depakote 750 mg twice daily.   --Continue L-Carnitine as per neurology recommendation from last admission --Seizure precautions  A-fib/flutter with RVR - not POA. 4/5 - HR's in 140's despite metoprolol. History of admission for same, difficult to control HR's --Cardiology following - considering cardioversion if still in A-fib tomorrow --Continue amiodarone drip --Continue metoprolol --Not on anticoagulation, CHA2DS2-VASc 1    Rhabdomyolysis - Nontraumatic, suspect due to seizures.  CK peaked at 3835 >> 1034 >> 399 (2 pts above normal range) --Stopped IV fluids    Suicidal ideation Recurrent major depression disorder --Psychiatry consulted on admission 4/2 --  " We recommend inpatient psychiatric hospitalization when medically cleared. Patient is under voluntary admission status at this time; please IVC if attempts to leave hospital. " --Inpatient psychiatry admission recommended after medically stabilized   Sinus tachycardia --Likely  postictal, improving      Subjective: Pt resting in bed when seen this AM.  He reports feeling somewhat better, denies heart palpitations overnight or this AM.  No other complaints besides very tired after being in ED hall bed almost 4 days.   Physical Exam: Vitals:   04/15/23 2349 04/16/23 0529 04/16/23 0836 04/16/23 1250  BP: (!) 140/95 116/74 136/83 111/75  Pulse: (!) 44 (!) 44 (!) 58 65  Resp: 18 18    Temp: (!) 97.4 F (36.3 C) (!) 97.3 F (36.3 C) (!) 97.5 F (36.4 C)   TempSrc:      SpO2: 97% 92% 95% 95%  Weight:      Height:       General exam: awake, alert, no acute distress HEENT: moist mucus membranes, hearing grossly normal  Respiratory system: on room air, normal respiratory effort. Cardiovascular system: bradycardic, regular rhythm, +S1/S2, no pedal edema.   Gastrointestinal system: soft, NT, ND Central nervous system: A&O x 3. no gross focal neurologic deficits, normal speech Extremities: moves all, no edema, normal tone Skin: dry, intact, normal temperature Psychiatry: depressed mood, congruent affect   Data Reviewed:  Notable labs --  Normal BMP and Mg  CK trend 3835 >> 1034 >> 399  Family Communication: None    Disposition: Status is: Inpatient Remains inpatient appropriate because: remains on amiodarone drip and cardiology considering cardioversion.       Planned Discharge Destination:  inpatient psych      Time spent: 42 minutes  Author: Pennie Banter, DO 04/16/2023 2:17 PM  For on call review [email or web address].

## 2023-04-16 NOTE — Plan of Care (Signed)
  Problem: Education: Goal: Knowledge of General Education information will improve Description: Including pain rating scale, medication(s)/side effects and non-pharmacologic comfort measures Outcome: Progressing   Problem: Clinical Measurements: Goal: Ability to maintain clinical measurements within normal limits will improve Outcome: Progressing   Problem: Pain Managment: Goal: General experience of comfort will improve and/or be controlled Outcome: Progressing   Problem: Safety: Goal: Verbalization of understanding the information provided will improve Outcome: Progressing

## 2023-04-16 NOTE — Plan of Care (Signed)
  Problem: Education: Goal: Knowledge of General Education information will improve Description: Including pain rating scale, medication(s)/side effects and non-pharmacologic comfort measures Outcome: Progressing   Problem: Health Behavior/Discharge Planning: Goal: Ability to manage health-related needs will improve Outcome: Progressing   Problem: Clinical Measurements: Goal: Ability to maintain clinical measurements within normal limits will improve Outcome: Progressing Goal: Will remain free from infection Outcome: Progressing Goal: Diagnostic test results will improve Outcome: Progressing Goal: Respiratory complications will improve Outcome: Progressing Goal: Cardiovascular complication will be avoided Outcome: Progressing   Problem: Activity: Goal: Risk for activity intolerance will decrease Outcome: Progressing   Problem: Nutrition: Goal: Adequate nutrition will be maintained Outcome: Progressing   Problem: Coping: Goal: Level of anxiety will decrease Outcome: Progressing   Problem: Elimination: Goal: Will not experience complications related to bowel motility Outcome: Progressing Goal: Will not experience complications related to urinary retention Outcome: Progressing   Problem: Pain Managment: Goal: General experience of comfort will improve and/or be controlled Outcome: Progressing   Problem: Safety: Goal: Ability to remain free from injury will improve Outcome: Progressing   Problem: Skin Integrity: Goal: Risk for impaired skin integrity will decrease Outcome: Progressing   Problem: Coping: Goal: Ability to adjust to condition or change in health will improve Outcome: Progressing Goal: Ability to identify appropriate support needs will improve Outcome: Progressing   Problem: Education: Goal: Expressions of having a comfortable level of knowledge regarding the disease process will increase Outcome: Progressing   Problem: Health Behavior/Discharge  Planning: Goal: Compliance with prescribed medication regimen will improve Outcome: Progressing   Problem: Clinical Measurements: Goal: Complications related to the disease process, condition or treatment will be avoided or minimized Outcome: Progressing Goal: Diagnostic test results will improve Outcome: Progressing   Problem: Safety: Goal: Verbalization of understanding the information provided will improve Outcome: Progressing   Problem: Self-Concept: Goal: Level of anxiety will decrease Outcome: Progressing Goal: Ability to verbalize feelings about condition will improve Outcome: Progressing

## 2023-04-17 ENCOUNTER — Telehealth (HOSPITAL_COMMUNITY): Payer: Self-pay | Admitting: Pharmacy Technician

## 2023-04-17 ENCOUNTER — Other Ambulatory Visit (HOSPITAL_COMMUNITY): Payer: Self-pay

## 2023-04-17 ENCOUNTER — Encounter: Payer: Self-pay | Admitting: Internal Medicine

## 2023-04-17 ENCOUNTER — Other Ambulatory Visit: Payer: Self-pay

## 2023-04-17 ENCOUNTER — Inpatient Hospital Stay (HOSPITAL_COMMUNITY)
Admit: 2023-04-17 | Discharge: 2023-04-17 | Disposition: A | Discharge status: Home / Self Care | Payer: MEDICAID | Attending: Medical | Admitting: Medical

## 2023-04-17 ENCOUNTER — Inpatient Hospital Stay: Payer: MEDICAID | Admitting: Anesthesiology

## 2023-04-17 ENCOUNTER — Encounter
Admission: EM | Disposition: A | Discharge status: Home / Self Care | Payer: Self-pay | Source: Home / Self Care | Attending: Internal Medicine

## 2023-04-17 DIAGNOSIS — I4891 Unspecified atrial fibrillation: Secondary | ICD-10-CM

## 2023-04-17 DIAGNOSIS — R569 Unspecified convulsions: Secondary | ICD-10-CM | POA: Diagnosis not present

## 2023-04-17 DIAGNOSIS — I4819 Other persistent atrial fibrillation: Secondary | ICD-10-CM

## 2023-04-17 DIAGNOSIS — F1910 Other psychoactive substance abuse, uncomplicated: Secondary | ICD-10-CM | POA: Diagnosis not present

## 2023-04-17 DIAGNOSIS — I361 Nonrheumatic tricuspid (valve) insufficiency: Secondary | ICD-10-CM

## 2023-04-17 HISTORY — PX: CARDIOVERSION: SHX1299

## 2023-04-17 HISTORY — PX: TEE WITHOUT CARDIOVERSION: SHX5443

## 2023-04-17 LAB — ECHO TEE

## 2023-04-17 SURGERY — ECHOCARDIOGRAM, TRANSESOPHAGEAL
Anesthesia: General

## 2023-04-17 MED ORDER — SUCCINYLCHOLINE CHLORIDE 200 MG/10ML IV SOSY
PREFILLED_SYRINGE | INTRAVENOUS | Status: AC
  Filled 2023-04-17: qty 10

## 2023-04-17 MED ORDER — SODIUM CHLORIDE 0.9 % IV SOLN: INTRAVENOUS | Status: DC | PRN

## 2023-04-17 MED ORDER — PROPOFOL 10 MG/ML IV BOLUS
INTRAVENOUS | Status: AC
  Filled 2023-04-17: qty 40

## 2023-04-17 MED ORDER — BUTAMBEN-TETRACAINE-BENZOCAINE 2-2-14 % EX AERO
INHALATION_SPRAY | CUTANEOUS | Status: AC
  Filled 2023-04-17: qty 5

## 2023-04-17 MED ORDER — EPHEDRINE 5 MG/ML INJ
INTRAVENOUS | Status: AC
  Filled 2023-04-17: qty 5

## 2023-04-17 MED ORDER — PROPOFOL 10 MG/ML IV BOLUS
INTRAVENOUS | Status: DC | PRN
  Administered 2023-04-17: 20 mg via INTRAVENOUS
  Administered 2023-04-17: 50 mg via INTRAVENOUS
  Administered 2023-04-17 (×3): 20 mg via INTRAVENOUS
  Administered 2023-04-17: 10 mg via INTRAVENOUS
  Administered 2023-04-17 (×2): 20 mg via INTRAVENOUS

## 2023-04-17 MED ORDER — AMIODARONE HCL 200 MG PO TABS
200.0000 mg | ORAL_TABLET | Freq: Two times a day (BID) | ORAL | Status: DC
  Administered 2023-04-17 – 2023-04-18 (×3): 200 mg via ORAL
  Filled 2023-04-17 (×4): qty 1

## 2023-04-17 MED ORDER — LIDOCAINE VISCOUS HCL 2 % MT SOLN
OROMUCOSAL | Status: AC
  Filled 2023-04-17: qty 15

## 2023-04-17 NOTE — Anesthesia Postprocedure Evaluation (Signed)
 Anesthesia Post Note  Patient: [name]  Procedure(s) Performed: ECHOCARDIOGRAM, TRANSESOPHAGEAL CARDIOVERSION  Patient location during evaluation: PACU Anesthesia Type: General Level of consciousness: awake and alert, oriented and patient cooperative Pain management: pain level controlled Vital Signs Assessment: post-procedure vital signs reviewed and stable Respiratory status: spontaneous breathing, nonlabored ventilation and respiratory function stable Cardiovascular status: blood pressure returned to baseline and stable Postop Assessment: adequate PO intake Anesthetic complications: no   No notable events documented.   Last Vitals:  Vitals:   04/17/23 1400 04/17/23 1415  BP: 97/75 103/74  Pulse: (!) 56 (!) 57  Resp: 14 12  Temp: 36.5 C   SpO2: 94% 96%    Last Pain:  Vitals:   04/17/23 1415  TempSrc:   PainSc: 0-No pain                 Reed Breech

## 2023-04-17 NOTE — Progress Notes (Signed)
 Rounding Note    Patient Name: [name] Date of Encounter: 04/17/2023   [facility] Cardiologist: Victor Kendall, MD   Subjective   Patient endorses ongoing palpitations with associated chest discomfort. Denies shortness of breath. Remains in atrial fibrillation/flutter with rates overall well controlled. Plan for TEE/DCCV this afternoon.   Inpatient Medications    Scheduled Meds:  divalproex  750 mg Oral BID   doxepin  50 mg Oral QHS   feeding supplement  237 mL Oral TID BM   levOCARNitine  500 mg Oral Daily   lurasidone  80 mg Oral Q supper   melatonin  5 mg Oral QHS   metoprolol tartrate  50 mg Oral BID   rivaroxaban  20 mg Oral Q supper   sodium chloride flush  3-10 mL Intravenous Q12H   sodium chloride flush  3-10 mL Intravenous Q12H   tamsulosin  0.4 mg Oral QPC supper   venlafaxine XR  150 mg Oral QPC breakfast   Continuous Infusions:  amiodarone 30 mg/hr (04/17/23 0838)   PRN Meds: ondansetron **OR** ondansetron (ZOFRAN) IV, mouth rinse, sodium chloride flush, sodium chloride flush, traMADol   Vital Signs    Vitals:   04/16/23 2348 04/17/23 0403 04/17/23 0500 04/17/23 0839  BP: (!) 137/97 (!) 125/92  (!) 132/107  Pulse:    67  Resp: 16 14  16   Temp: 98.7 F (37.1 C) 97.8 F (36.6 C)  97.6 F (36.4 C)  TempSrc:  Oral    SpO2: 98% 94%  92%  Weight:   80.2 kg   Height:        Intake/Output Summary (Last 24 hours) at 04/17/2023 1006 Last data filed at 04/16/2023 2118 Gross per 24 hour  Intake 558.67 ml  Output --  Net 558.67 ml      04/17/2023    5:00 AM 04/11/2023   11:44 PM 02/12/2023    4:49 PM  Last 3 Weights  Weight (lbs) 176 lb 12.8 oz 165 lb 165 lb  Weight (kg) 80.196 kg 74.844 kg 74.844 kg      Telemetry    Atrial fibrillation/flutter with rates 70-90s bpm up to 115 bpm at times - Personally Reviewed  Physical Exam   GEN: No acute distress.   Neck: No JVD Cardiac: IRIR, no murmurs, rubs, or gallops.  Respiratory:  Clear to auscultation bilaterally. GI: Soft, nontender, non-distended  MS: No edema; No deformity. Neuro:  Nonfocal  Psych: Flat affect   Labs    High Sensitivity Troponin:   Recent Labs  Lab 04/12/23 0121 04/12/23 0257  TROPONINIHS 43* 45*     Chemistry Recent Labs  Lab 04/12/23 0121 04/14/23 0530 04/16/23 0518  NA 138 138 138  K 4.9 3.7 3.8  CL 105 112* 106  CO2 17* 20* 23  GLUCOSE 106* 88 85  BUN 21* 8 15  CREATININE 1.10 0.88 0.94  CALCIUM 9.9 7.9* 9.4  MG 1.9  --  2.0  PROT 8.2* 5.5*  --   ALBUMIN 4.7 2.9*  --   AST 54* 25  --   ALT 20 15  --   ALKPHOS 57 34*  --   BILITOT 0.6 0.6  --   GFRNONAA >60 >60 >60  ANIONGAP 16* 6 9    Lipids No results for input(s): "CHOL", "TRIG", "HDL", "LABVLDL", "LDLCALC", "CHOLHDL" in the last 168 hours.  Hematology Recent Labs  Lab 04/12/23 0257  WBC 8.0  RBC 4.66  HGB 14.2  HCT 40.4  MCV 86.7  MCH 30.5  MCHC 35.1  RDW 13.8  PLT 160   Thyroid No results for input(s): "TSH", "FREET4" in the last 168 hours.  BNPNo results for input(s): "BNP", "PROBNP" in the last 168 hours.  DDimer No results for input(s): "DDIMER" in the last 168 hours.   Radiology    No results found.  Cardiac Studies   12/21/2022 Echo complete  1. Left ventricular ejection fraction, by estimation, is 60 to 65%. The  left ventricle has normal function. The left ventricle has no regional  wall motion abnormalities. There is moderate left ventricular hypertrophy.  Left ventricular diastolic  parameters were normal.   2. Right ventricular systolic function is normal. The right ventricular  size is normal. There is normal pulmonary artery systolic pressure.   3. Left atrial size was mildly dilated.   4. The mitral valve is normal in structure. Mild to moderate mitral valve  regurgitation. No evidence of mitral stenosis.   5. The aortic valve is normal in structure. There is mild calcification  of the aortic valve. Aortic valve regurgitation  is not visualized. Aortic  valve sclerosis is present, with no evidence of aortic valve stenosis.  Aortic valve mean gradient measures  3.0 mmHg.   6. The inferior vena cava is normal in size with greater than 50%  respiratory variability, suggesting right atrial pressure of 3 mmHg.    Patient Profile     63 y.o. male with a history of seizure disorder, noncompliance with seizure medications, major depressive disorder, hypertension, substance abuse, atrial fibrillation/flutter who is being seen for the continued evaluation of atrial fibrillation with RVR.  Assessment & Plan    Atrial fibrillation with RVR - Recent admission 12/2022 for SVT with underlying atrial fibrillation/flutter. Conversion to sinus with IV amiodarone.  - Presented 4/1 with EKG at that time showing sinus tachycardia. Patient experienced palpitations 4/5 with EKG showing atrial fibrillation rate 132 bpm.  - Urine tox positive for cocaine and cannabinoid - Echo with normal EF - CHA2DS2-VASc 1-2 (hypertension and coronary artery calcification/aortic atherosclerosis) - Recommend K >4 and mag > 2 - Continue Xarelto, started yesterday - Remains in atrial fibrillation/flutter with rates overall controlled 70-90s bpm up to 115 bpm at times - Continue IV amiodarone - Continue metoprolol tartrate 50 mg twice daily  - Plan for TEE/DCCV today - Recommend outpatient follow-up with EP  Informed Consent   Shared Decision Making/Informed Consent   The risks [stroke, cardiac arrhythmias rarely resulting in the need for a temporary or permanent pacemaker, skin irritation or burns, esophageal damage, perforation (1:10,000 risk), bleeding, pharyngeal hematoma as well as other potential complications associated with conscious sedation including aspiration, arrhythmia, respiratory failure and death], benefits (treatment guidance, restoration of normal sinus rhythm, diagnostic support) and alternatives of a transesophageal echocardiogram  guided cardioversion were discussed in detail with [name] and he is willing to proceed.     Hypertension - BP well controlled  - Continue metoprolol as above  Seizure activity - Secondary to medication noncompliance - Management per IM  Suicidal ideation Recurrent major depression disorder - Psych consulted previously in admission with recommendation for psychiatry admission after patient is medically stabilized  For questions or updates, please contact [facility] Please consult [email or web address] for contact info under        Signed, Orion Crook, PA-C  04/17/2023, 10:06 AM

## 2023-04-17 NOTE — Transfer of Care (Signed)
 Immediate Anesthesia Transfer of Care Note  Patient: [name]  Procedure(s) Performed: ECHOCARDIOGRAM, TRANSESOPHAGEAL CARDIOVERSION  Patient Location: Nursing Unit  Anesthesia Type:General  Level of Consciousness: awake, oriented, and patient cooperative  Airway & Oxygen Therapy: Patient Spontanous Breathing and Patient connected to nasal cannula oxygen  Post-op Assessment: Report given to RN and Post -op Vital signs reviewed and stable  Post vital signs: Reviewed and stable  Last Vitals:  Vitals Value Taken Time  BP 82/61 04/17/23 1317  Temp    Pulse 57 04/17/23 1318  Resp 16 04/17/23 1318  SpO2 96 % 04/17/23 1318    Last Pain:  Vitals:   04/17/23 1213  TempSrc: Oral  PainSc: 0-No pain      Patients Stated Pain Goal: 0 (04/15/23 2228)  Complications: No notable events documented.

## 2023-04-17 NOTE — Anesthesia Procedure Notes (Signed)
 Date/Time: 04/17/2023 12:55 PM  Performed by: Malva Cogan, CRNAPre-anesthesia Checklist: Patient identified, Emergency Drugs available, Suction available, Patient being monitored and Timeout performed Patient Re-evaluated:Patient Re-evaluated prior to induction Oxygen Delivery Method: Nasal cannula Induction Type: IV induction Placement Confirmation: CO2 detector and positive ETCO2

## 2023-04-17 NOTE — Progress Notes (Signed)
  Progress Note   Patient: [name] ZOX:[id number] DOB: [birth year] DOA: 04/12/2023     2 DOS: the patient was seen and examined on 04/17/2023   Brief hospital course: "[name] Colon Gremillion is a 63 y.o. male with medical history significant of seizure disorder, history of noncompliant with seizure medications, major depression disorder, HTN, substance abuse, presented with recurrent seizure and suicidal ideation..." See H&P for full HPI on admission & ED course.  Patient was loaded with Keppra in the ED and started on aggressive IV hydration for rhabdomyolysis.     Assessment and Plan:  Seizure secondary to noncoherent with seizure medications Per H&P: "Patient admitted that he only takes Depakote once a day instead of 2 times a day as instructed.  Discussed with patient regarding importance of coherent with antiseizure medication instructions.  Patient appears understanding and agreed." Loaded with 1 g IV Keppra in the ED PLAN: --Continue Depakote 750 mg twice daily.   --Continue L-Carnitine as per neurology recommendation from last admission --Seizure precautions  A-fib/flutter with RVR - not POA. 4/5 - HR's in 140's despite metoprolol. History of admission for same, difficult to control HR's --Cardiology following - cardioversion planned today --Continue amiodarone drip  --Continue metoprolol --Not on anticoagulation, CHA2DS2-VASc 1    Rhabdomyolysis - Nontraumatic, suspect due to seizures.  CK peaked at 3835 >> 1034 >> 399 (2 pts above normal range) --Stopped IV fluids    Suicidal ideation Recurrent major depression disorder --Psychiatry consulted on admission 4/2 --  " We recommend inpatient psychiatric hospitalization when medically cleared. Patient is under voluntary admission status at this time; please IVC if attempts to leave hospital. " --Inpatient psychiatry admission recommended after medically stabilized   Sinus tachycardia --Likely postictal, resolved.  Later  went in to A-fib RVR as outlined above.      Subjective: Pt resting in bed when seen this AM.  He reports feeling well, better.  He states no longer having thoughts of suicide and that he's feeling better mood-wise since admission.   Physical Exam: Vitals:   04/17/23 0403 04/17/23 0500 04/17/23 0839 04/17/23 1213  BP: (!) 125/92  (!) 132/107 121/88  Pulse:   67 97  Resp: 14  16 (!) 21  Temp: 97.8 F (36.6 C)  97.6 F (36.4 C) 98.1 F (36.7 C)  TempSrc: Oral   Oral  SpO2: 94%  92% 96%  Weight:  80.2 kg  80.2 kg  Height:    5\' 9"  (1.753 m)   General exam: awake, alert, no acute distress HEENT: moist mucus membranes, hearing grossly normal  Respiratory system: on room air, normal respiratory effort. Cardiovascular system: RRR, +S1/S2, no pedal edema.   Gastrointestinal system: soft, NT, ND Central nervous system: A&O x 3. no gross focal neurologic deficits, normal speech Extremities: moves all, no edema, normal tone Skin: dry, intact, normal temperature Psychiatry: normal mood, congruent affect, no SI   Data Reviewed:  Notable labs 4/6 --  Normal BMP and Mg   Family Communication: None    Disposition: Status is: Inpatient Remains inpatient appropriate because: cardioversion today, not yet cleared by cardiology. Psych recommending inpatient [facility] admission once medically clear.      Planned Discharge Destination:  inpatient psych        Time spent: 42 minutes  Author: Pennie Banter, DO 04/17/2023 12:52 PM  For on call review [email or web address].

## 2023-04-17 NOTE — Progress Notes (Signed)
*  PRELIMINARY RESULTS* Echocardiogram Echocardiogram Transesophageal has been performed.  [name] 04/17/2023, 1:26 PM

## 2023-04-17 NOTE — Anesthesia Preprocedure Evaluation (Addendum)
 Anesthesia Evaluation  Patient identified by MRN, date of birth, ID band Patient awake    Reviewed: Allergy & Precautions, NPO status , Patient's Chart, lab work & pertinent test results  History of Anesthesia Complications Negative for: history of anesthetic complications  Airway Mallampati: IV   Neck ROM: Full    Dental  (+) Missing   Pulmonary former smoker (quit 1986)   Pulmonary exam normal breath sounds clear to auscultation       Cardiovascular hypertension, + CAD   Rhythm:Irregular Rate:Normal     Neuro/Psych Seizures -,  PSYCHIATRIC DISORDERS (PTSD) Anxiety Depression Bipolar Disorder   Marijuana and cocaine use disorder; last cocaine use 2 weeks ago    GI/Hepatic hiatal hernia,GERD  ,,NAFLD   Endo/Other  negative endocrine ROS    Renal/GU negative Renal ROS     Musculoskeletal  (+) Arthritis ,    Abdominal   Peds  Hematology  (+) Blood dyscrasia, anemia   Anesthesia Other Findings   Reproductive/Obstetrics                             Anesthesia Physical Anesthesia Plan  ASA: 3  Anesthesia Plan: General   Post-op Pain Management:    Induction: Intravenous  PONV Risk Score and Plan: 2 and Propofol infusion, TIVA and Treatment may vary due to age or medical condition  Airway Management Planned: Natural Airway  Additional Equipment:   Intra-op Plan:   Post-operative Plan:   Informed Consent: I have reviewed the patients History and Physical, chart, labs and discussed the procedure including the risks, benefits and alternatives for the proposed anesthesia with the patient or authorized representative who has indicated his/her understanding and acceptance.       Plan Discussed with: CRNA  Anesthesia Plan Comments: (LMA/GETA backup discussed.  Patient consented for risks of anesthesia including but not limited to:  - adverse reactions to medications - damage to  eyes, teeth, lips or other oral mucosa - nerve damage due to positioning  - sore throat or hoarseness - damage to heart, brain, nerves, lungs, other parts of body or loss of life  Informed patient about role of CRNA in peri- and intra-operative care.  Patient voiced understanding.)        Anesthesia Quick Evaluation

## 2023-04-17 NOTE — Procedures (Signed)
 Transesophageal Echocardiogram and DC cardioversion:  Indication: atrial fibrillation/atrial flutter Requesting/ordering  physician:   Procedure: 10 ml of viscous lidocaine were given orally to provide local anesthesia to the oropharynx. The patient was positioned supine on the left side, bite block provided. The patient was sedated per anesthesia team. Using digital technique an omniplane probe was advanced into the esophagus without incident.   See report in EPIC  for complete details: In brief, imaging revealed normal LV function with no RWMAs and no mural apical thrombus.  .  Estimated ejection fraction was 55%.  Right sided cardiac chambers were normal with no evidence of pulmonary hypertension.  Imaging of the septum showed no ASD or VSD 2D and color flow confirmed no PFO  The LA was well visualized in orthogonal views.  There was no thrombus in the LA and LA appendage   The descending thoracic aorta had no  mural aortic debris with no evidence of aneurysmal dilation or dissection  Cardioversion procedure note For atrial fibrillation.  Procedure Details: Patient placed on cardiac monitor, pulse oximetry, supplemental oxygen as necessary.   Sedation given: propofol IV per anesthesia team  Pacer pads placed anterior and posterior chest.  Cardioverted 1 time(s).   Cardioverted at  150J. Synchronized biphasic Converted to NSR  Evaluation: Findings: Post procedure EKG shows: NSR Complications: None Patient did tolerate procedure well.  Recommendation. Start po amiodarone 200mg  bid x 7 days--->200mg  daily after -cont xarelto, lopressor -f/u with EP as outpatient   Time Spent Directly with the Patient:  35 minutes   Debbe Odea, M.D.    Arlys John Agbor-Etang 04/17/2023 1:12 PM

## 2023-04-17 NOTE — Plan of Care (Signed)
  Problem: Clinical Measurements: Goal: Ability to maintain clinical measurements within normal limits will improve Outcome: Progressing   Problem: Safety: Goal: Verbalization of understanding the information provided will improve Outcome: Progressing

## 2023-04-17 NOTE — Telephone Encounter (Signed)
 Pharmacy Patient Advocate Encounter  Insurance verification completed.    The patient is insured through [employer] [place].     Ran test claim for ELIQUIS 5MG  TABLETS and the current 30 day co-pay is 4.00.  Ran test claim for XARELTO 10MG  TABLETS and the current 30 day co-pay is 4.00   This test claim was processed through [employer]- copay amounts may vary at other pharmacies due to [employer], or as the patient moves through the different stages of their insurance plan.

## 2023-04-18 ENCOUNTER — Encounter: Payer: Self-pay | Admitting: Cardiology

## 2023-04-18 ENCOUNTER — Other Ambulatory Visit: Payer: Self-pay

## 2023-04-18 DIAGNOSIS — R569 Unspecified convulsions: Secondary | ICD-10-CM | POA: Diagnosis not present

## 2023-04-18 DIAGNOSIS — I4891 Unspecified atrial fibrillation: Secondary | ICD-10-CM | POA: Diagnosis not present

## 2023-04-18 MED ORDER — METOPROLOL SUCCINATE ER 25 MG PO TB24
25.0000 mg | ORAL_TABLET | Freq: Every day | ORAL | 2 refills | Status: DC
  Filled 2023-04-18: qty 30, 30d supply, fill #0

## 2023-04-18 MED ORDER — METOPROLOL SUCCINATE ER 25 MG PO TB24
25.0000 mg | ORAL_TABLET | Freq: Every day | ORAL | Status: DC
  Administered 2023-04-18: 25 mg via ORAL
  Filled 2023-04-18: qty 1

## 2023-04-18 MED ORDER — AMIODARONE HCL 200 MG PO TABS
ORAL_TABLET | ORAL | 0 refills | Status: DC
  Filled 2023-04-18: qty 44, 37d supply, fill #0

## 2023-04-18 MED ORDER — RIVAROXABAN 20 MG PO TABS
20.0000 mg | ORAL_TABLET | Freq: Every day | ORAL | 2 refills | Status: DC
  Filled 2023-04-18: qty 30, 30d supply, fill #0

## 2023-04-18 NOTE — Discharge Summary (Signed)
 Physician Discharge Summary   Patient: [name] MRN: [id number] DOB: 21-Mar-1960  Admit date:     04/12/2023  Discharge date: {dischdate:26783}  Discharge Physician: Pennie Banter   PCP: [name], PA   Recommendations at discharge:  {Tip this will not be part of the note when signed- Example include specific recommendations for outpatient follow-up, pending tests to follow-up on. (Optional):26781}  ***  Discharge Diagnoses: Principal Problem:   Seizure (HCC) Active Problems:   Seizures (HCC)   Atrial fibrillation with rapid ventricular response (HCC)   Primary hypertension   Persistent atrial fibrillation (HCC)  Resolved Problems:   * No resolved hospital problems. [facility] Course: No notes on file  Assessment and Plan: No notes have been filed under this hospital service. Service: Hospitalist     {Tip this will not be part of the note when signed Body mass index is 26.11 kg/m. , ,  (Optional):26781}  {(NOTE) Pain control PDMP Statment (Optional):26782} Consultants: *** Procedures performed: ***  Disposition: {Plan; Disposition:26390} Diet recommendation:  Discharge Diet Orders (From admission, onward)     Start     Ordered   04/18/23 0000  Diet - low sodium heart healthy        04/18/23 1355           {Diet_Plan:26776} DISCHARGE MEDICATION: Allergies as of 04/18/2023       Reactions   Ketorolac Tromethamine Hives, Rash   Other Reaction(s): Not available        Medication List     TAKE these medications    amiodarone 200 MG tablet Commonly known as: PACERONE Take 1 tablet (200 mg total) by mouth 2 (two) times daily for 7 days, THEN 1 tablet (200 mg total) daily. Start taking on: April 18, 2023   divalproex 250 MG DR tablet Commonly known as: DEPAKOTE Take 3 tablets (750 mg total) by mouth 2 (two) times daily.   doxepin 50 MG capsule Commonly known as: SINEQUAN Take 1 capsule (50 mg total) by mouth at bedtime.    feeding supplement Liqd Take 237 mLs by mouth 3 (three) times daily between meals.   levOCARNitine 1 GM/10ML solution Commonly known as: CARNITOR Take 5 mLs (500 mg total) by mouth daily.   lurasidone 20 MG Tabs tablet Commonly known as: LATUDA Take 4 tablets (80 mg total) by mouth daily with supper. What changed: how much to take   melatonin 5 MG Tabs Take 1 tablet (5 mg total) by mouth at bedtime.   metoprolol succinate 25 MG 24 hr tablet Commonly known as: TOPROL-XL Take 1 tablet (25 mg total) by mouth daily. Start taking on: April 19, 2023 What changed:  medication strength how much to take   multivitamin with minerals Tabs tablet Take 1 tablet by mouth daily.   rivaroxaban 20 MG Tabs tablet Commonly known as: XARELTO Take 1 tablet (20 mg total) by mouth daily with supper.   tamsulosin 0.4 MG Caps capsule Commonly known as: FLOMAX Take 1 capsule (0.4 mg total) by mouth daily after supper.   traMADol 50 MG tablet Commonly known as: ULTRAM Take 1 tablet (50 mg total) by mouth every 6 (six) hours as needed for severe pain (pain score 7-10).   venlafaxine XR 150 MG 24 hr capsule Commonly known as: EFFEXOR-XR Take 1 capsule (150 mg total) by mouth daily after breakfast.        Discharge Exam: Filed Weights   04/11/23 2344 04/17/23 0500 04/17/23 1213  Weight: 74.8  kg 80.2 kg 80.2 kg   ***  Condition at discharge: {DC Condition:26389}  The results of significant diagnostics from this hospitalization (including imaging, microbiology, ancillary and laboratory) are listed below for reference.   Imaging Studies: ECHO TEE Result Date: 04/17/2023    TRANSESOPHOGEAL ECHO REPORT   Patient Name:   [facility] Date of Exam: 04/17/2023 Medical Rec #:  [id number]         Height:       69.0 in Accession #:    [id number]        Weight:       176.8 lb Date of Birth:  1960/04/28        BSA:          1.961 m Patient Age:    62 years          BP:           132/107 mmHg  Patient Gender: M                 HR:           97 bpm. Exam Location:  [facility] Procedure: Transesophageal Echo, Cardiac Doppler and Color Doppler (Both            Spectral and Color Flow Doppler were utilized during procedure). Indications:     Not listed on TEE check-in sheet  History:         Patient has prior history of Echocardiogram examinations, most                  recent 12/21/2022. Signs/Symptoms:Syncope; Risk                  Factors:Hypertension.  Sonographer:     Cristela Blue Referring Phys:  [id number] CADENCE H FURTH Diagnosing Phys: Debbe Odea MD PROCEDURE: The transesophogeal probe was passed without difficulty through the esophogus of the patient. Sedation performed by different physician. The patient developed no complications during the procedure. A successful direct current cardioversion was  performed at 150 joules with 1 attempt.  IMPRESSIONS  1. Left ventricular ejection fraction, by estimation, is 55 to 60%. The left ventricle has normal function.  2. Right ventricular systolic function is normal. The right ventricular size is normal.  3. Left atrial size was moderately dilated. No left atrial/left atrial appendage thrombus was detected.  4. The mitral valve is normal in structure. Trivial mitral valve regurgitation.  5. The aortic valve is tricuspid. Aortic valve regurgitation is trivial. FINDINGS  Left Ventricle: Left ventricular ejection fraction, by estimation, is 55 to 60%. The left ventricle has normal function. The left ventricular internal cavity size was normal in size. Right Ventricle: The right ventricular size is normal. No increase in right ventricular wall thickness. Right ventricular systolic function is normal. Left Atrium: Left atrial size was moderately dilated. No left atrial/left atrial appendage thrombus was detected. Right Atrium: Right atrial size was normal in size. Pericardium: There is no evidence of pericardial effusion. Mitral Valve: The mitral valve is normal in  structure. Trivial mitral valve regurgitation. Tricuspid Valve: The tricuspid valve is normal in structure. Tricuspid valve regurgitation is mild. Aortic Valve: The aortic valve is tricuspid. Aortic valve regurgitation is trivial. Pulmonic Valve: The pulmonic valve was normal in structure. Pulmonic valve regurgitation is mild. Aorta: The aortic root is normal in size and structure. IAS/Shunts: No atrial level shunt detected by color flow Doppler. Debbe Odea MD Electronically signed by Debbe Odea MD Signature Date/Time: 04/17/2023/3:49:41 PM  Final    CT T-SPINE NO CHARGE Result Date: 04/12/2023 CLINICAL DATA:  Blunt polytrauma, seizure EXAM: CT Thoracic and Lumbar spine with contrast TECHNIQUE: Multiplanar CT images of the thoracic and lumbar spine were reconstructed from contemporary CT of the Chest, Abdomen, and Pelvis. RADIATION DOSE REDUCTION: This exam was performed according to the departmental dose-optimization program which includes automated exposure control, adjustment of the mA and/or kV according to patient size and/or use of iterative reconstruction technique. CONTRAST:  No additional contrast was administered for creation of these reformats COMPARISON:  None Available. FINDINGS: CT THORACIC SPINE FINDINGS Alignment: Normal. Vertebrae: No acute fracture or focal pathologic process. Paraspinal and other soft tissues: Negative. Disc levels: Intervertebral disc heights are preserved. Spinal canal is widely patent. CT LUMBAR SPINE FINDINGS Segmentation: 5 lumbar type vertebrae. Alignment: Normal. Vertebrae: No acute fracture or focal pathologic process. Paraspinal and other soft tissues: Negative. Disc levels: Intervertebral disc heights are preserved. Spinal canal is widely patent. No significant neuroforaminal narrowing. IMPRESSION: 1. Normal CT examination of the thoracic and lumbar spine. Electronically Signed   By: Helyn Numbers M.D.   On: 04/12/2023 03:20   CT L-SPINE NO  CHARGE Result Date: 04/12/2023 CLINICAL DATA:  Blunt polytrauma, seizure EXAM: CT Thoracic and Lumbar spine with contrast TECHNIQUE: Multiplanar CT images of the thoracic and lumbar spine were reconstructed from contemporary CT of the Chest, Abdomen, and Pelvis. RADIATION DOSE REDUCTION: This exam was performed according to the departmental dose-optimization program which includes automated exposure control, adjustment of the mA and/or kV according to patient size and/or use of iterative reconstruction technique. CONTRAST:  No additional contrast was administered for creation of these reformats COMPARISON:  None Available. FINDINGS: CT THORACIC SPINE FINDINGS Alignment: Normal. Vertebrae: No acute fracture or focal pathologic process. Paraspinal and other soft tissues: Negative. Disc levels: Intervertebral disc heights are preserved. Spinal canal is widely patent. CT LUMBAR SPINE FINDINGS Segmentation: 5 lumbar type vertebrae. Alignment: Normal. Vertebrae: No acute fracture or focal pathologic process. Paraspinal and other soft tissues: Negative. Disc levels: Intervertebral disc heights are preserved. Spinal canal is widely patent. No significant neuroforaminal narrowing. IMPRESSION: 1. Normal CT examination of the thoracic and lumbar spine. Electronically Signed   By: Helyn Numbers M.D.   On: 04/12/2023 03:20   CT HEAD WO CONTRAST ( ) Result Date: 04/12/2023 CLINICAL DATA:  Head trauma and seizure EXAM: CT HEAD WITHOUT CONTRAST CT CERVICAL SPINE WITHOUT CONTRAST TECHNIQUE: Multidetector CT imaging of the head and cervical spine was performed following the standard protocol without intravenous contrast. Multiplanar CT image reconstructions of the cervical spine were also generated. RADIATION DOSE REDUCTION: This exam was performed according to the departmental dose-optimization program which includes automated exposure control, adjustment of the mA and/or kV according to patient size and/or use of iterative  reconstruction technique. COMPARISON:  None Available. FINDINGS: CT HEAD FINDINGS Brain: No mass,hemorrhage or extra-axial collection. Normal appearance of the parenchyma and CSF spaces. Vascular: Atherosclerotic calcification of the internal carotid arteries at the skull base. No abnormal hyperdensity of the major intracranial arteries or dural venous sinuses. Skull: The visualized skull base, calvarium and extracranial soft tissues are normal. Sinuses/Orbits: No fluid levels or advanced mucosal thickening of the visualized paranasal sinuses. No mastoid or middle ear effusion. Normal orbits. Other: None. CT CERVICAL SPINE FINDINGS Alignment: Grade 1 anterolisthesis at C3-4. Facets are aligned. Occipital condyles are normally positioned. Skull base and vertebrae: No acute fracture. Soft tissues and spinal canal: No prevertebral fluid or swelling. No visible canal  hematoma. Disc levels: No advanced spinal canal or neural foraminal stenosis. Upper chest: No pneumothorax, pulmonary nodule or pleural effusion. Other: Normal visualized paraspinal cervical soft tissues. IMPRESSION: 1. No acute intracranial abnormality. 2. No acute fracture or traumatic subluxation of the cervical spine. Electronically Signed   By: Deatra Robinson M.D.   On: 04/12/2023 03:14   CT Cervical Spine Wo Contrast Result Date: 04/12/2023 CLINICAL DATA:  Head trauma and seizure EXAM: CT HEAD WITHOUT CONTRAST CT CERVICAL SPINE WITHOUT CONTRAST TECHNIQUE: Multidetector CT imaging of the head and cervical spine was performed following the standard protocol without intravenous contrast. Multiplanar CT image reconstructions of the cervical spine were also generated. RADIATION DOSE REDUCTION: This exam was performed according to the departmental dose-optimization program which includes automated exposure control, adjustment of the mA and/or kV according to patient size and/or use of iterative reconstruction technique. COMPARISON:  None Available.  FINDINGS: CT HEAD FINDINGS Brain: No mass,hemorrhage or extra-axial collection. Normal appearance of the parenchyma and CSF spaces. Vascular: Atherosclerotic calcification of the internal carotid arteries at the skull base. No abnormal hyperdensity of the major intracranial arteries or dural venous sinuses. Skull: The visualized skull base, calvarium and extracranial soft tissues are normal. Sinuses/Orbits: No fluid levels or advanced mucosal thickening of the visualized paranasal sinuses. No mastoid or middle ear effusion. Normal orbits. Other: None. CT CERVICAL SPINE FINDINGS Alignment: Grade 1 anterolisthesis at C3-4. Facets are aligned. Occipital condyles are normally positioned. Skull base and vertebrae: No acute fracture. Soft tissues and spinal canal: No prevertebral fluid or swelling. No visible canal hematoma. Disc levels: No advanced spinal canal or neural foraminal stenosis. Upper chest: No pneumothorax, pulmonary nodule or pleural effusion. Other: Normal visualized paraspinal cervical soft tissues. IMPRESSION: 1. No acute intracranial abnormality. 2. No acute fracture or traumatic subluxation of the cervical spine. Electronically Signed   By: Deatra Robinson M.D.   On: 04/12/2023 03:14   CT CHEST ABDOMEN PELVIS W CONTRAST Result Date: 04/12/2023 CLINICAL DATA:  Polytrauma, blunt, seizure, right flank pain, right chest pain EXAM: CT CHEST, ABDOMEN, AND PELVIS WITH CONTRAST TECHNIQUE: Multidetector CT imaging of the chest, abdomen and pelvis was performed following the standard protocol during bolus administration of intravenous contrast. RADIATION DOSE REDUCTION: This exam was performed according to the departmental dose-optimization program which includes automated exposure control, adjustment of the mA and/or kV according to patient size and/or use of iterative reconstruction technique. CONTRAST:  OMNIPAQUE IOHEXOL 300 MG/ML  SOLN COMPARISON:  None Available. FINDINGS: CT CHEST FINDINGS  Cardiovascular: Extensive multi-vessel coronary artery calcification. Global cardiac size within limits. No pericardial effusion. Central pulmonary arteries are of normal caliber. Thoracic aorta is unremarkable. Mediastinum/Nodes: No enlarged mediastinal, hilar, or axillary lymph nodes. Thyroid gland, trachea, and esophagus demonstrate no significant findings. Lungs/Pleura: Mild right parenchymal scarring. Bibasilar dependent atelectasis. No confluent pulmonary infiltrate. No pneumothorax or pleural effusion. No central obstructing lesion. Musculoskeletal: No chest wall mass or suspicious bone lesions identified. CT ABDOMEN PELVIS FINDINGS Hepatobiliary: Cholelithiasis without superimposed pericholecystic inflammatory change. Liver unremarkable; no enhancing intrahepatic mass identified. No intra or extrahepatic biliary ductal dilation. Pancreas: Unremarkable Spleen: Unremarkable Adrenals/Urinary Tract: Adrenal glands are unremarkable. Simple cortical cysts are seen within the kidneys bilaterally for which no follow-up imaging is recommended. Moderate cortical scarring bilaterally. The kidneys are otherwise unremarkable. Bladder unremarkable. Stomach/Bowel: Stomach is within normal limits. Appendix absent. No evidence of bowel wall thickening, distention, or inflammatory changes. Vascular/Lymphatic: Aortic atherosclerosis. No enlarged abdominal or pelvic lymph nodes. Reproductive: Prostate is unremarkable.  Other: Tiny fat containing umbilical hernia Musculoskeletal: No acute or significant osseous findings. IMPRESSION: 1. No acute intrathoracic or intra-abdominal injury identified. 2. Extensive multi-vessel coronary artery calcification. 3. Cholelithiasis. Aortic Atherosclerosis (ICD10-I70.0). Electronically Signed   By: Helyn Numbers M.D.   On: 04/12/2023 03:10   DG Chest 1 View Result Date: 04/12/2023 CLINICAL DATA:  Chest pain EXAM: CHEST  1 VIEW COMPARISON:  12/20/2022 FINDINGS: The heart size and mediastinal  contours are within normal limits. Both lungs are clear. The visualized skeletal structures are unremarkable. IMPRESSION: No active disease. Electronically Signed   By: Charlett Nose M.D.   On: 04/12/2023 00:22    Microbiology: Results for orders placed or performed during the hospital encounter of 07/05/22  SARS Coronavirus 2 by RT PCR (hospital order, performed in [facility] hospital lab) *cepheid single result test* Anterior Nasal Swab     Status: None   Collection Time: 07/06/22 12:35 PM   Specimen: Anterior Nasal Swab  Result Value Ref Range Status   SARS Coronavirus 2 by RT PCR NEGATIVE NEGATIVE Final    Comment: (NOTE) SARS-CoV-2 target nucleic acids are NOT DETECTED.  The SARS-CoV-2 RNA is generally detectable in upper and lower respiratory specimens during the acute phase of infection. The lowest concentration of SARS-CoV-2 viral copies this assay can detect is 250 copies / mL. A negative result does not preclude SARS-CoV-2 infection and should not be used as the sole basis for treatment or other patient management decisions.  A negative result may occur with improper specimen collection / handling, submission of specimen other than nasopharyngeal swab, presence of viral mutation(s) within the areas targeted by this assay, and inadequate number of viral copies (<250 copies / mL). A negative result must be combined with clinical observations, patient history, and epidemiological information.  Fact Sheet for Patients:   RoadLapTop.co.za  Fact Sheet for Healthcare Providers: http://kim-miller.com/  This test is not yet approved or  cleared by the [place] FDA and has been authorized for detection and/or diagnosis of SARS-CoV-2 by FDA under an Emergency Use Authorization (EUA).  This EUA will remain in effect (meaning this test can be used) for the duration of the COVID-19 declaration under Section 564(b)(1) of the Act, 21  U.S.C. section 360bbb-3(b)(1), unless the authorization is terminated or revoked sooner.  Performed at [facility], [address] [place], [place] [address]     Labs: CBC: Recent Labs  Lab 04/12/23 0257  WBC 8.0  HGB 14.2  HCT 40.4  MCV 86.7  PLT 160   Basic Metabolic Panel: Recent Labs  Lab 04/12/23 0121 04/14/23 0530 04/16/23 0518  NA 138 138 138  K 4.9 3.7 3.8  CL 105 112* 106  CO2 17* 20* 23  GLUCOSE 106* 88 85  BUN 21* 8 15  CREATININE 1.10 0.88 0.94  CALCIUM 9.9 7.9* 9.4  MG 1.9  --  2.0  PHOS 4.5  --   --    Liver Function Tests: Recent Labs  Lab 04/12/23 0121 04/14/23 0530  AST 54* 25  ALT 20 15  ALKPHOS 57 34*  BILITOT 0.6 0.6  PROT 8.2* 5.5*  ALBUMIN 4.7 2.9*   CBG: No results for input(s): "GLUCAP" in the last 168 hours.  Discharge time spent: {LESS THAN/GREATER UEAV:40981} 30 minutes.  Signed: Pennie Banter, DO [facility] 04/18/2023

## 2023-04-18 NOTE — Progress Notes (Signed)
 Rounding Note    Patient Name: [name] [name] Date of Encounter: 04/18/2023  [facility] Cardiologist: Yvonne Kendall, MD   Subjective   Patient reports feeling much better today without chest pain and palpitations. Successful TEE/DCCV yesterday. Remains in sinus rhythm.  Inpatient Medications    Scheduled Meds:  amiodarone  200 mg Oral BID   divalproex  750 mg Oral BID   doxepin  50 mg Oral QHS   feeding supplement  237 mL Oral TID BM   levOCARNitine  500 mg Oral Daily   lurasidone  80 mg Oral Q supper   melatonin  5 mg Oral QHS   metoprolol succinate  25 mg Oral Daily   rivaroxaban  20 mg Oral Q supper   sodium chloride flush  3-10 mL Intravenous Q12H   tamsulosin  0.4 mg Oral QPC supper   venlafaxine XR  150 mg Oral QPC breakfast   Continuous Infusions:   PRN Meds: ondansetron **OR** ondansetron (ZOFRAN) IV, mouth rinse, sodium chloride flush, traMADol   Vital Signs    Vitals:   04/17/23 1934 04/18/23 0019 04/18/23 0407 04/18/23 0748  BP: 109/72 (!) 113/58 (!) 93/58 93/65  Pulse: 84 82 65 65  Resp: 16 14 16 17   Temp: (!) 97.5 F (36.4 C) 98.1 F (36.7 C) 98.5 F (36.9 C) 97.7 F (36.5 C)  TempSrc: Oral Oral  Oral  SpO2: 98% 95% 93% 96%  Weight:      Height:        Intake/Output Summary (Last 24 hours) at 04/18/2023 1222 Last data filed at 04/18/2023 0900 Gross per 24 hour  Intake 810 ml  Output 0 ml  Net 810 ml      04/17/2023   12:13 PM 04/17/2023    5:00 AM 04/11/2023   11:44 PM  Last 3 Weights  Weight (lbs) 176 lb 12.9 oz 176 lb 12.8 oz 165 lb  Weight (kg) 80.2 kg 80.196 kg 74.844 kg      Telemetry    Sinus rhythm - Personally Reviewed  Physical Exam   GEN: No acute distress.   Neck: No JVD Cardiac: IRIR, no murmurs, rubs, or gallops.  Respiratory: Clear to auscultation bilaterally. GI: Soft, nontender, non-distended  MS: No edema; No deformity. Neuro:  Nonfocal  Psych: Flat affect   Labs    High Sensitivity Troponin:    Recent Labs  Lab 04/12/23 0121 04/12/23 0257  TROPONINIHS 43* 45*     Chemistry Recent Labs  Lab 04/12/23 0121 04/14/23 0530 04/16/23 0518  NA 138 138 138  K 4.9 3.7 3.8  CL 105 112* 106  CO2 17* 20* 23  GLUCOSE 106* 88 85  BUN 21* 8 15  CREATININE 1.10 0.88 0.94  CALCIUM 9.9 7.9* 9.4  MG 1.9  --  2.0  PROT 8.2* 5.5*  --   ALBUMIN 4.7 2.9*  --   AST 54* 25  --   ALT 20 15  --   ALKPHOS 57 34*  --   BILITOT 0.6 0.6  --   GFRNONAA >60 >60 >60  ANIONGAP 16* 6 9    Lipids No results for input(s): "CHOL", "TRIG", "HDL", "LABVLDL", "LDLCALC", "CHOLHDL" in the last 168 hours.  Hematology Recent Labs  Lab 04/12/23 0257  WBC 8.0  RBC 4.66  HGB 14.2  HCT 40.4  MCV 86.7  MCH 30.5  MCHC 35.1  RDW 13.8  PLT 160   Thyroid No results for input(s): "TSH", "FREET4" in the  last 168 hours.  BNPNo results for input(s): "BNP", "PROBNP" in the last 168 hours.  DDimer No results for input(s): "DDIMER" in the last 168 hours.   Radiology    ECHO TEE Result Date: 04/17/2023 IMPRESSIONS  1. Left ventricular ejection fraction, by estimation, is 55 to 60%. The left ventricle has normal function.  2. Right ventricular systolic function is normal. The right ventricular size is normal.  3. Left atrial size was moderately dilated. No left atrial/left atrial appendage thrombus was detected.  4. The mitral valve is normal in structure. Trivial mitral valve regurgitation.  5. The aortic valve is tricuspid. Aortic valve regurgitation is trivial. Electronically signed by Debbe Odea MD Signature Date/Time: 04/17/2023/3:49:41 PM    Final    Cardiac Studies   12/21/2022 Echo complete  1. Left ventricular ejection fraction, by estimation, is 60 to 65%. The  left ventricle has normal function. The left ventricle has no regional  wall motion abnormalities. There is moderate left ventricular hypertrophy.  Left ventricular diastolic  parameters were normal.   2. Right ventricular systolic  function is normal. The right ventricular  size is normal. There is normal pulmonary artery systolic pressure.   3. Left atrial size was mildly dilated.   4. The mitral valve is normal in structure. Mild to moderate mitral valve  regurgitation. No evidence of mitral stenosis.   5. The aortic valve is normal in structure. There is mild calcification  of the aortic valve. Aortic valve regurgitation is not visualized. Aortic  valve sclerosis is present, with no evidence of aortic valve stenosis.  Aortic valve mean gradient measures  3.0 mmHg.   6. The inferior vena cava is normal in size with greater than 50%  respiratory variability, suggesting right atrial pressure of 3 mmHg.    Patient Profile     63 y.o. male with a history of seizure disorder, noncompliance with seizure medications, major depressive disorder, hypertension, substance abuse, atrial fibrillation/flutter who is being seen for the continued evaluation of atrial fibrillation with RVR.  Assessment & Plan    Atrial fibrillation with RVR - Recent admission 12/2022 for SVT with underlying atrial fibrillation/flutter. Conversion to sinus with IV amiodarone.  - Presented 4/1 with EKG at that time showing sinus tachycardia. Patient experienced palpitations 4/5 with EKG showing atrial fibrillation rate 132 bpm.  - Urine tox positive for cocaine and cannabinoid - Echo with normal EF - CHA2DS2-VASc 1-2 (hypertension and coronary artery calcification/aortic atherosclerosis) - Recommend K >4 and mag > 2 - Continue Xarelto - Successful TEE/DCCV yesterday - Remains in sinus rhythm - Continue amiodarone 200 mg oral twice daily x 7 days followed by 200 mg daily thereafter - Metoprolol consolidated/reduced to metoprolol succinate 25 mg daily in the setting of bradycardia and hypotension - Encouraged medication compliance  - Recommend close outpatient follow-up with EP  Hypertension - BP well controlled  - Continue metoprolol as  above  Seizure activity - Secondary to medication noncompliance - Management per IM  Suicidal ideation Recurrent major depression disorder - Psych consulted previously in admission with recommendation for psychiatry admission after patient is medically stabilized  For questions or updates, please contact [facility] Please consult [email or web address] for contact info under        Signed, Orion Crook, PA-C  04/18/2023, 12:22 PM

## 2023-04-18 NOTE — Consult Note (Signed)
 [facility] Psychiatric Consult Follow-up  Patient Name: .[name]  MRN: [id number]  DOB: 08-11-1960  Consult Order details:  Orders (From admission, onward)     Start     Ordered   04/18/23 0901  IP CONSULT TO PSYCHIATRY       Ordering Provider: Pennie Banter, DO  Provider:  (Not yet assigned)  Question Answer Comment  Location [facility]   Reason for Consult? Please follow up on 04/12/23 consult, inpatient psych admission was recommended.  Pt is medically clear.      04/18/23 0901   04/12/23 0703  IP CONSULT TO PSYCHIATRY       Ordering Provider: Raelyn Number, DO  Provider:  (Not yet assigned)  Question Answer Comment  Place call to: SI   Reason for Consult Consult      04/12/23 0702             Mode of Visit: In person    Psychiatry Consult Evaluation  Service Date: April 18, 2023 LOS:  LOS: 3 days  Chief Complaint   Primary Psychiatric Diagnoses  Adjustment disorder with emotional disturbance   Assessment  [name] is a 63 y.o. male with psychiatric history of alcohol abuse, Cluster B personality disorder, MDD, malingering, cocaine use, SI, PTSD, bipolar disorder with psychotic features and polysubstance abuse presenting to [facility] ED on 04/12/23 at 1:19 AM due to self injurious behaviors and suicidal ideations.  Patient reports having a seizure causing him to cut himself prior to ED admission.  Patient reports a history of cutting but states he has not cut in 3 years.  Patient was medically admitted for breakthrough seizures in the context of noncompliance with medications.  He also had episode of A-fib and cardiology was working with him and stabilizing.  Psychiatry was consulted to evaluate for safety.  On assessment patient denies feeling depressed or anxious.  He consistently denied SI/HI/intent/plan last 2 days.  He denies auditory/visual hallucinations.  Inpatient hospitalization was offered to him but he declined at this  time stating he feels safe and wants to follow-up outpatient mental health services at [facility] and has an appointment coming up in 2 weeks.  He reports responding well to medications.  Patient is not meeting criteria for IVC at this time as is participating in treatment and is willing to participate in outpatient services and maintain safe behaviors.     Diagnoses:  Adjustment disorder with emotional disturbance Active Hospital problems: Principal Problem:   Seizure (HCC) Active Problems:   Seizures (HCC)   Atrial fibrillation with rapid ventricular response (HCC)   Primary hypertension   Persistent atrial fibrillation (HCC)    Plan   ## Psychiatric Medication Recommendations:  Effexor XR 150 mg daily Depakote 750 mg BID Latuda 80 mg with supper Doxepin 50mg  QHS  ## Medical Decision Making Capacity: Not specifically addressed in this encounter  ## Further Work-up:   -- most recent EKG on 04/11/23 had QtC of 423    ## Disposition:-- There are no psychiatric contraindications to discharge at this time  ## Behavioral / Environmental: - No specific recommendations at this time.     ## Safety and Observation Level:  - Based on my clinical evaluation, I estimate the patient to be at LOW risk of self harm in the current setting. - At this time, we recommend  routine. This decision is based on my review of the chart including patient's history and current presentation, interview of  the patient, mental status examination, and consideration of suicide risk including evaluating suicidal ideation, plan, intent, suicidal or self-harm behaviors, risk factors, and protective factors. This judgment is based on our ability to directly address suicide risk, implement suicide prevention strategies, and develop a safety plan while the patient is in the clinical setting. Please contact our team if there is a concern that risk level has changed.  CSSR Risk Category:C-SSRS RISK CATEGORY: High  Risk  Suicide Risk Assessment: Patient has following modifiable risk factors for suicide: active medical problems which we are addressing by medical treatment and outpatient follow up. Patient has following non-modifiable or demographic risk factors for suicide: male gender and psychiatric hospitalization Patient has the following protective factors against suicide: Access to outpatient mental health care, Supportive family, Cultural, spiritual, or religious beliefs that discourage suicide, and Frustration tolerance  Thank you for this consult request. Recommendations have been communicated to the primary team.  We will sign off at this time.   [name], MD       History of Present Illness  [name] is a 63 y.o. male with psychiatric history of alcohol abuse, Cluster B personality disorder, MDD, malingering, cocaine use, SI, PTSD, bipolar disorder with psychotic features and polysubstance abuse presenting to [facility] ED on 04/12/23 at 1:19 AM due to self injurious behaviors and suicidal ideations.  Patient reports having a seizure causing him to cut himself prior to ED admission.  Patient reports a history of cutting but states he has not cut in 3 years.  Patient was medically admitted for breakthrough seizures in the context of noncompliance with medications.  He also had episode of A-fib and cardiology was working with him and stabilizing.  Psychiatry was consulted to evaluate for safety.  On today's assessment patient reports that he is feeling much better.  He is back on his usual seizure medications.  He reports that he is ready to go home.  He denies having any complaints of firearms in the house.  He informed the provider that he follows up with [facility] and he has appointment on May 01, 2023.  He is taking his medications with no reported side effects.  He remains future oriented and is willing to participate in outpatient mental health services.  Psych ROS:  Depression: Denies feeling  hopeless/worthless, denies anhedonia, currently denies SI/HI/intent/plan Anxiety: Denies anxiety and panic attacks Mania (lifetime and current): Not displaying any grandiose delusions Psychosis: (lifetime and current): Denies auditory/visual hallucinations, not responding to any internal stimuli.  Collateral information:  Patient reports living with a roommate and reports he has no family that is in contact with him.  ROS   Psychiatric and Social History  Psychiatric History:  Information collected from chart  Prev Dx/Sx: see above Current Psych Provider: [facility] Home Meds (current): see above Previous Med Trials: he is unable to identify past medications Therapy: yes. He is currently established with therapy at [facility]   Prior Psych Hospitalization: yes.  Prior Self Harm: yes. Cutting Prior Violence: denies   Family Psych History: brother committed suicide by self inflicted GSW in front of patient Family Hx suicide: yes   Social History:  Developmental Hx: normal Educational Hx: 8 th grade Occupational Hx: disable Legal Hx: denies Living Situation: independent living in inhabitable conditions Spiritual Hx: Christian Access to weapons/lethal means: denies    Substance History Alcohol: denies  Type of alcohol n/a Last Drink n/a Number of drinks per day n/a History of alcohol withdrawal  seizures no History of DT's no Tobacco: denies Illicit drugs: cocaine used last week Prescription drug abuse: denies Rehab hx: denies Exam Findings  Physical Exam: Due to and agree with the physical exam findings conducted by the medical provider Vital Signs:  Temp:  [97.5 F (36.4 C)-98.5 F (36.9 C)] 97.7 F (36.5 C) (04/08 0748) Pulse Rate:  [57-84] 65 (04/08 0748) Resp:  [12-17] 17 (04/08 0748) BP: (93-113)/(58-76) 93/65 (04/08 0748) SpO2:  [93 %-98 %] 96 % (04/08 0748) Blood pressure 93/65, pulse 65, temperature 97.7 F (36.5 C), temperature source Oral, resp.  rate 17, height 5\' 9"  (1.753 m), weight 80.2 kg, SpO2 96%. Body mass index is 26.11 kg/m.    Mental Status Exam: General Appearance: Casual and Fairly Groomed  Orientation:  Full (Time, Place, and Person)  Memory:  Immediate;   Fair Recent;   Fair Remote;   Fair  Concentration:  Concentration: Fair and Attention Span: Fair  Recall:  Fair  Attention  Fair  Eye Contact:  Fair  Speech:  Clear and Coherent  Language:  Fair  Volume:  Normal  Mood: good  Affect:  Appropriate  Thought Process:  Coherent  Thought Content:  Logical  Suicidal Thoughts:  No  Homicidal Thoughts:  No  Judgement:  Fair  Insight:  Fair  Psychomotor Activity:  Normal  Akathisia:  No  Fund of Knowledge:  Fair      Assets:  Manufacturing systems engineer Desire for Improvement Financial Resources/Insurance Housing Resilience Social Support Vocational/Educational  Cognition:  WNL  ADL's:  Intact  AIMS (if indicated):        Other History   These have been pulled in through the EMR, reviewed, and updated if appropriate.  Family History:  The patient's family history includes CVA in his mother; Depression in his brother.  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 echo 5/17: EF 60-65%, no RWMA, LV dias fxn nl, LA mildly dilated, RV sys fxn nl, PASP nl, moderate sized circumferential pericardial effusion was identified, 2.12 cm around the LV free wall, <1 cm around the RV free wall. Features were not c/w tamponade physiology   PTSD (post-traumatic stress disorder)    Witnessed brother's suicide.   Restless leg syndrome    Seizures (HCC)    Syncope      Surgical History: Past Surgical History:  Procedure Laterality Date   APPENDECTOMY     CYSTOSCOPY WITH URETEROSCOPY AND STENT PLACEMENT     ESOPHAGOGASTRODUODENOSCOPY N/A 01/11/2016   Procedure: ESOPHAGOGASTRODUODENOSCOPY (EGD);  Surgeon: Charlott Rakes, MD;  Location: [facility] ENDOSCOPY;  Service: Endoscopy;  Laterality: N/A;   ESOPHAGOGASTRODUODENOSCOPY N/A 04/09/2020   Procedure: ESOPHAGOGASTRODUODENOSCOPY (EGD);  Surgeon: [name], MD;  Location: [facility] ENDOSCOPY;  Service: Gastroenterology;  Laterality: N/A;   INCISION AND DRAINAGE ABSCESS Left 01/02/2018   Procedure: INCISION AND DRAINAGE LEFT KNEE;  Surgeon: Deeann Saint, MD;  Location: [facility] ORS;  Service: Orthopedics;  Laterality: Left;   JOINT REPLACEMENT Right    TKR   KNEE ARTHROSCOPY Right 06/25/2014   Procedure: ARTHROSCOPY  KNEE;  Surgeon: Deeann Saint, MD;  Location: [facility] ORS;  Service: Orthopedics;  Laterality: Right;  partial arthroscopic medial menisectomy   LAPAROSCOPIC APPENDECTOMY N/A 06/02/2021   Procedure: APPENDECTOMY LAPAROSCOPIC;  Surgeon: Campbell Lerner, MD;  Location: [facility] ORS;  Service: General;  Laterality: N/A;   TOTAL KNEE ARTHROPLASTY Right 04/22/2015   Procedure: TOTAL KNEE ARTHROPLASTY;  Surgeon: Deeann Saint, MD;  Location: [facility] ORS;  Service: Orthopedics;  Laterality: Right;   TOTAL KNEE ARTHROPLASTY Left 10/30/2017   Procedure: TOTAL KNEE ARTHROPLASTY;  Surgeon: Deeann Saint, MD;  Location: [facility] ORS;  Service: Orthopedics;  Laterality: Left;   TOTAL KNEE REVISION Left 01/02/2018   Procedure: poly exchange of tibia and patella left knee;  Surgeon: Deeann Saint, MD;  Location: [facility] ORS;  Service: Orthopedics;  Laterality: Left;   UMBILICAL HERNIA REPAIR  06/02/2021   Procedure: HERNIA REPAIR UMBILICAL ADULT;  Surgeon: Campbell Lerner, MD;  Location: [facility] ORS;  Service: General;;     Medications:   Current Facility-Administered Medications:    amiodarone (PACERONE) tablet 200 mg, 200  mg, Oral, BID, Agbor-Etang, Arlys John, MD, 200 mg at 04/18/23 0924   divalproex (DEPAKOTE) DR tablet 750 mg, 750 mg, Oral, BID, Mikey College T, MD, 750 mg at 04/18/23 0924   doxepin (SINEQUAN) capsule 50 mg, 50 mg, Oral, QHS, Mikey College T, MD, 50 mg at 04/17/23 2105   feeding supplement (ENSURE ENLIVE / ENSURE PLUS) liquid 237 mL, 237 mL, Oral, TID BM, Mikey College T, MD, 237 mL at 04/16/23 2023   levOCARNitine (CARNITOR) 1 GM/10ML solution 500 mg, 500 mg, Oral, Daily, Zhang, Ping T, MD   lurasidone (LATUDA) tablet 80 mg, 80 mg, Oral, Q supper, Mikey College T, MD, 80 mg at 04/17/23 1647   melatonin tablet 5 mg, 5 mg, Oral, QHS, Mikey College T, MD, 5 mg at 04/12/23 2353   metoprolol succinate (TOPROL-XL) 24 hr tablet 25 mg, 25 mg, Oral, Daily, End, Christopher, MD, 25 mg at 04/18/23 0924   ondansetron (ZOFRAN) tablet 4 mg, 4 mg, Oral, Q6H PRN **OR** ondansetron (ZOFRAN) injection 4 mg, 4 mg, Intravenous, Q6H PRN, Emeline General, MD   Oral care mouth rinse, 15 mL, Mouth Rinse, PRN, Esaw Grandchild A, DO   rivaroxaban (XARELTO) tablet 20 mg, 20 mg, Oral, Q supper, Furth, Cadence H, PA-C, 20 mg at 04/17/23 1646   sodium chloride flush (NS) 0.9 % injection 3-10 mL, 3-10 mL, Intravenous, Q12H, Zhang, Ping T, MD, 10 mL at 04/17/23 2105   sodium chloride flush (NS) 0.9 % injection 3-10 mL, 3-10 mL, Intravenous, PRN, Mikey College T, MD   tamsulosin (FLOMAX) capsule 0.4 mg, 0.4 mg, Oral, QPC supper, Chipper Herb, Ping T, MD, 0.4 mg at 04/17/23 1646   traMADol (ULTRAM) tablet 50 mg, 50 mg, Oral, Q6H PRN, Mikey College T, MD, 50 mg at 04/17/23 1959   venlafaxine XR (EFFEXOR-XR) 24 hr capsule 150 mg, 150 mg, Oral, QPC breakfast, Mikey College T, MD, 150 mg at 04/18/23 1610  Allergies: Allergies  Allergen Reactions   Ketorolac Tromethamine Hives and Rash    Other Reaction(s): Not available    [name], MD

## 2023-05-10 ENCOUNTER — Encounter: Payer: Self-pay | Admitting: Cardiology

## 2023-05-10 ENCOUNTER — Ambulatory Visit: Payer: MEDICAID | Attending: Cardiology | Admitting: Cardiology

## 2023-05-10 ENCOUNTER — Other Ambulatory Visit: Payer: Self-pay

## 2023-05-10 VITALS — BP 120/84 | HR 61 | Ht 69.0 in | Wt 182.0 lb

## 2023-05-10 DIAGNOSIS — R569 Unspecified convulsions: Secondary | ICD-10-CM | POA: Diagnosis present

## 2023-05-10 DIAGNOSIS — Z79899 Other long term (current) drug therapy: Secondary | ICD-10-CM | POA: Diagnosis present

## 2023-05-10 DIAGNOSIS — I4819 Other persistent atrial fibrillation: Secondary | ICD-10-CM | POA: Insufficient documentation

## 2023-05-10 NOTE — Progress Notes (Signed)
 Electrophysiology Office Note:    Date:  05/10/2023   ID:  [name], [name] October 10, 1960, MRN [id number]  [facility] Cardiologist:  Sammy Crisp, MD  [facility] Electrophysiologist:  Boyce Byes, MD   Referring MD: Aneita Baptise, [place]   Chief Complaint: Atrial fibrillation  History of Present Illness:    [name] is a 62 year old man who I am seeing today for an evaluation of atrial fibrillation at the request of Dr. Junnie Olives.  The patient has a history of atrial fibrillation, hypertension, seizures.  He is not on anticoagulation given CHA2DS2-VASc of 1.  He was transiently on amiodarone  in the past for rhythm control.  The patient was recently hospitalized April 2 through April 18, 2023.  He required amiodarone  drip for atrial fibrillation with rapid ventricular rates.  He was started on amiodarone  by mouth upon discharge in addition to metoprolol .  He is not on anticoagulation.  From prior to arrival today the patient experienced a seizure.  Apparently this was a standard seizure for him.  They have been occurring 1-2 times per month.  No clear triggers.  He has not seen a neurologist yet.  He asks for a referral today which I am happy to put in for him.  He is back to his usual state of health during the office visit.  His heart rhythm has been okay.  He has been taking his amiodarone  and Xarelto .     Their past medical, social and family history was reviewed.   ROS:   Please see the history of present illness.    All other systems reviewed and are negative.  EKGs/Labs/Other Studies Reviewed:    The following studies were reviewed today:    EKG Interpretation Date/Time:  Wednesday May 10 2023 15:15:40 EDT Ventricular Rate:  61 PR Interval:  178 QRS Duration:  84 QT Interval:  420 QTC Calculation: 422 R Axis:   61  Text Interpretation: Normal sinus rhythm Normal ECG Confirmed by Harvie Liner [phone]) on 05/10/2023 3:20:49 PM    Physical  Exam:    VS:  BP 120/84 (BP Location: Left Arm, Patient Position: Sitting, Cuff Size: Normal)   Pulse 61   Ht 5\' 9"  (1.753 m)   Wt 182 lb (82.6 kg)   SpO2 96%   BMI 26.88 kg/m     Wt Readings from Last 3 Encounters:  05/10/23 182 lb (82.6 kg)  04/17/23 176 lb 12.9 oz (80.2 kg)  02/12/23 165 lb (74.8 kg)     GEN: no distress CARD: RRR, No MRG RESP: No IWOB. CTAB.        ASSESSMENT AND PLAN:    1. Persistent atrial fibrillation (HCC)   2. Encounter for long-term (current) use of high-risk medication   3. Seizure-like activity (HCC)     #Atrial fibrillation and flutter After 1 month can stop his Xarelto  and start aspirin  81 mg by mouth once daily given a CHA2DS2-VASc of 1 Takes amiodarone  200 mg by mouth once daily, continue this for now Check CMP, TSH and free T4 in 2 to 3 weeks  #Possible seizure disorder He is back to his normal state of health at this time.  No clear triggers.  I will put in a referral to neurology.  If he experiences another seizure episode, he should immediately present to the emergency department.  Follow-up with APP in 6 months.      Signed, Leanora Prophet. Marven Slimmer, MD, [facility], [facility] 05/10/2023 3:36 PM    Electrophysiology [facility]

## 2023-05-10 NOTE — Patient Instructions (Addendum)
 Medication Instructions:  Your physician recommends that you continue on your current medications as directed. Please refer to the Current Medication list given to you today.  ONE MONTH after your cardioversion (5/7) 1) STOP taking Xarelto   2) START taking Aspirin  81 mg daily   *If you need a refill on your cardiac medications before your next appointment, please call your pharmacy*  Lab Work: Your provider would like for you to return in 2 weeks to have the following labs drawn: CMET, TSH, T4.   Please go to [facility] [address] ([facility], [place] [address] You do not need an appointment.  They are open from 8 am- 4:30 pm.  Lunch from 1:00 pm- 2:00 pm You do NOT need to be fasting.  Follow-Up: At [facility], you and your health needs are our priority.  As part of our continuing mission to provide you with exceptional heart care, our providers are all part of one team.  This team includes your primary Cardiologist (physician) and Advanced Practice Providers or APPs (Physician Assistants and Nurse Practitioners) who all work together to provide you with the care you need, when you need it.  Your next appointment:   6 months  Provider:   You will see one of the following Advanced Practice Providers on your designated Care Team:   Mertha Abrahams, Antoine Kirsch" [place], PA-C Suzann Riddle, NP Creighton Doffing, NP   Other Instructions You have been referred to see a Neurologist

## 2023-05-19 ENCOUNTER — Other Ambulatory Visit: Payer: Self-pay

## 2023-05-19 MED ORDER — ASPIRIN 81 MG PO TBEC: 81.0000 mg | DELAYED_RELEASE_TABLET | Freq: Every day | ORAL | Status: DC

## 2023-05-29 ENCOUNTER — Emergency Department: Payer: MEDICAID

## 2023-05-29 ENCOUNTER — Other Ambulatory Visit: Payer: Self-pay

## 2023-05-29 ENCOUNTER — Encounter: Payer: Self-pay | Admitting: Intensive Care

## 2023-05-29 ENCOUNTER — Emergency Department
Admission: EM | Admit: 2023-05-29 | Discharge: 2023-05-30 | Disposition: A | Discharge status: Other Acute Inpatient Hospital | Payer: MEDICAID | Attending: Emergency Medicine | Admitting: Emergency Medicine

## 2023-05-29 DIAGNOSIS — R45851 Suicidal ideations: Secondary | ICD-10-CM | POA: Insufficient documentation

## 2023-05-29 DIAGNOSIS — R569 Unspecified convulsions: Secondary | ICD-10-CM | POA: Insufficient documentation

## 2023-05-29 DIAGNOSIS — F411 Generalized anxiety disorder: Secondary | ICD-10-CM | POA: Diagnosis not present

## 2023-05-29 DIAGNOSIS — F1910 Other psychoactive substance abuse, uncomplicated: Secondary | ICD-10-CM | POA: Diagnosis not present

## 2023-05-29 DIAGNOSIS — F3130 Bipolar disorder, current episode depressed, mild or moderate severity, unspecified: Secondary | ICD-10-CM | POA: Insufficient documentation

## 2023-05-29 DIAGNOSIS — I10 Essential (primary) hypertension: Secondary | ICD-10-CM | POA: Diagnosis not present

## 2023-05-29 DIAGNOSIS — F4310 Post-traumatic stress disorder, unspecified: Secondary | ICD-10-CM | POA: Diagnosis not present

## 2023-05-29 DIAGNOSIS — F1090 Alcohol use, unspecified, uncomplicated: Secondary | ICD-10-CM | POA: Diagnosis not present

## 2023-05-29 LAB — COMPREHENSIVE METABOLIC PANEL WITH GFR
ALT: 11 U/L (ref 0–44)
AST: 15 U/L (ref 15–41)
Albumin: 4.1 g/dL (ref 3.5–5.0)
Alkaline Phosphatase: 47 U/L (ref 38–126)
Anion gap: 12 (ref 5–15)
BUN: 17 mg/dL (ref 6–20)
CO2: 19 mmol/L — ABNORMAL LOW (ref 22–32)
Calcium: 9.3 mg/dL (ref 8.9–10.3)
Chloride: 106 mmol/L (ref 98–111)
Creatinine, Ser: 1 mg/dL (ref 0.61–1.24)
GFR, Estimated: >60 mL/min (ref >60)
Glucose, Bld: 90 mg/dL (ref 70–99)
Potassium: 4.1 mmol/L (ref 3.5–5.1)
Sodium: 137 mmol/L (ref 135–145)
Total Bilirubin: 0.7 mg/dL (ref 0.0–1.2)
Total Protein: 7.2 g/dL (ref 6.5–8.1)

## 2023-05-29 LAB — CBC
HCT: 41.7 % (ref 39.0–52.0)
Hemoglobin: 14.1 g/dL (ref 13.0–17.0)
MCH: 30.7 pg (ref 26.0–34.0)
MCHC: 33.8 g/dL (ref 30.0–36.0)
MCV: 90.7 fL (ref 80.0–100.0)
Platelets: 201 10*3/uL (ref 150–400)
RBC: 4.6 MIL/uL (ref 4.22–5.81)
RDW: 14.8 % (ref 11.5–15.5)
WBC: 4.9 10*3/uL (ref 4.0–10.5)
nRBC: 0 % (ref 0.0–0.2)

## 2023-05-29 LAB — SALICYLATE LEVEL: Salicylate Lvl: <7 mg/dL — ABNORMAL LOW (ref 7.0–30.0)

## 2023-05-29 LAB — URINE DRUG SCREEN, QUALITATIVE (ARMC ONLY)
Amphetamines, Ur Screen: NOT DETECTED
Barbiturates, Ur Screen: NOT DETECTED
Benzodiazepine, Ur Scrn: NOT DETECTED
Cannabinoid 50 Ng, Ur ~~LOC~~: NOT DETECTED
Cocaine Metabolite,Ur ~~LOC~~: NOT DETECTED
MDMA (Ecstasy)Ur Screen: NOT DETECTED
Methadone Scn, Ur: NOT DETECTED
Opiate, Ur Screen: NOT DETECTED
Phencyclidine (PCP) Ur S: NOT DETECTED
Tricyclic, Ur Screen: NOT DETECTED

## 2023-05-29 LAB — ACETAMINOPHEN LEVEL: Acetaminophen (Tylenol), Serum: 22 ug/mL (ref 10–30)

## 2023-05-29 LAB — CK: Total CK: 74 U/L (ref 49–397)

## 2023-05-29 LAB — TROPONIN I (HIGH SENSITIVITY)
Troponin I (High Sensitivity): 8 ng/L (ref <18)
Troponin I (High Sensitivity): 7 ng/L (ref <18)

## 2023-05-29 LAB — AMMONIA: Ammonia: 31 umol/L (ref 9–35)

## 2023-05-29 LAB — ETHANOL: Alcohol, Ethyl (B): <15 mg/dL (ref <15)

## 2023-05-29 LAB — VALPROIC ACID LEVEL: Valproic Acid Lvl: 27 ug/mL — ABNORMAL LOW (ref 50–100)

## 2023-05-29 MED ORDER — DIVALPROEX SODIUM 250 MG PO DR TAB
750.0000 mg | DELAYED_RELEASE_TABLET | Freq: Two times a day (BID) | ORAL | Status: DC
  Administered 2023-05-29 – 2023-05-30 (×2): 750 mg via ORAL
  Filled 2023-05-29 (×2): qty 1

## 2023-05-29 MED ORDER — ADULT MULTIVITAMIN W/MINERALS CH
1.0000 | ORAL_TABLET | Freq: Every day | ORAL | Status: DC
  Administered 2023-05-29 – 2023-05-30 (×2): 1 via ORAL
  Filled 2023-05-29 (×2): qty 1

## 2023-05-29 MED ORDER — VENLAFAXINE HCL ER 150 MG PO CP24
150.0000 mg | ORAL_CAPSULE | Freq: Every day | ORAL | Status: DC
  Administered 2023-05-30: 150 mg via ORAL
  Filled 2023-05-29: qty 1

## 2023-05-29 MED ORDER — SODIUM CHLORIDE 0.9 % IV BOLUS
1000.0000 mL | Freq: Once | INTRAVENOUS | Status: AC
  Administered 2023-05-29: 1000 mL via INTRAVENOUS

## 2023-05-29 MED ORDER — LEVOCARNITINE 1 GM/10ML PO SOLN
500.0000 mg | Freq: Every day | ORAL | Status: DC
  Filled 2023-05-29 (×2): qty 5

## 2023-05-29 MED ORDER — MELATONIN 5 MG PO TABS
5.0000 mg | ORAL_TABLET | Freq: Every day | ORAL | Status: DC
  Administered 2023-05-29: 5 mg via ORAL
  Filled 2023-05-29: qty 1

## 2023-05-29 MED ORDER — DOXEPIN HCL 50 MG PO CAPS
50.0000 mg | ORAL_CAPSULE | Freq: Every day | ORAL | Status: DC
  Administered 2023-05-29: 50 mg via ORAL
  Filled 2023-05-29: qty 1

## 2023-05-29 MED ORDER — DIVALPROEX SODIUM 500 MG PO DR TAB
750.0000 mg | DELAYED_RELEASE_TABLET | Freq: Once | ORAL | Status: AC
  Administered 2023-05-29: 750 mg via ORAL
  Filled 2023-05-29: qty 1

## 2023-05-29 MED ORDER — METOPROLOL SUCCINATE ER 25 MG PO TB24
25.0000 mg | ORAL_TABLET | Freq: Every day | ORAL | Status: DC
  Administered 2023-05-29 – 2023-05-30 (×2): 25 mg via ORAL
  Filled 2023-05-29 (×2): qty 1

## 2023-05-29 MED ORDER — TAMSULOSIN HCL 0.4 MG PO CAPS: 0.4000 mg | ORAL_CAPSULE | Freq: Every day | ORAL | Status: DC

## 2023-05-29 MED ORDER — OXYCODONE HCL 5 MG PO TABS
5.0000 mg | ORAL_TABLET | Freq: Once | ORAL | Status: AC
  Administered 2023-05-29: 5 mg via ORAL
  Filled 2023-05-29: qty 1

## 2023-05-29 MED ORDER — LURASIDONE HCL 20 MG PO TABS
20.0000 mg | ORAL_TABLET | Freq: Every day | ORAL | Status: DC
  Filled 2023-05-29: qty 1

## 2023-05-29 MED ORDER — DIVALPROEX SODIUM 250 MG PO DR TAB: 750.0000 mg | DELAYED_RELEASE_TABLET | Freq: Two times a day (BID) | ORAL | 0 refills | Status: DC

## 2023-05-29 MED ORDER — TRAMADOL HCL 50 MG PO TABS
50.0000 mg | ORAL_TABLET | Freq: Four times a day (QID) | ORAL | Status: DC | PRN
  Administered 2023-05-29: 50 mg via ORAL
  Filled 2023-05-29: qty 1

## 2023-05-29 MED ORDER — AMIODARONE HCL 200 MG PO TABS
200.0000 mg | ORAL_TABLET | Freq: Every day | ORAL | Status: DC
  Administered 2023-05-29 – 2023-05-30 (×2): 200 mg via ORAL
  Filled 2023-05-29 (×2): qty 1

## 2023-05-29 MED ORDER — ENSURE ENLIVE PO LIQD: 237.0000 mL | Freq: Three times a day (TID) | ORAL | Status: DC

## 2023-05-29 NOTE — ED Notes (Signed)
 Pt requesting pain medication. EDP Bradler informed and will update meds once pharmacy speaks with pt. Pharmacy informed and states they will see pt next. Pt updated.

## 2023-05-29 NOTE — ED Notes (Signed)
 X-ray at bedside

## 2023-05-29 NOTE — ED Notes (Signed)
 Lab at bedside

## 2023-05-29 NOTE — ED Notes (Signed)
Pt given drink and crackers  

## 2023-05-29 NOTE — BH Assessment (Signed)
 Comprehensive Clinical Assessment (CCA) Screening, Triage and Referral Note  05/29/2023 [facility] Colon Castilleja [id number]  [name] 63 year old male who presents to [facility] ED voluntarily for treatment. Per triage note, Patient presents with suicidal thoughts of "kill yourself." Plan to hang himself but no intention of acting on it. Patient reports "I know I had seizures early this morning lying in bed. I am really emotional when I wake up from a seizure." Reports he had three in bed and one when he got up to go to the bathroom. Reports in the bathroom his leg went out from under him and bent backwards.   During TTS assessment pt presents alert and oriented x 4, restless but cooperative, and mood-congruent with affect. The pt does not appear to be responding to internal or external stimuli. Neither is the pt presenting with any delusional thinking. Pt verified the information provided to triage RN.   Pt identifies his main complaint to be that he is feeling hopeless due to missing his girlfriend who is currently in a nursing facility with dementia. Patient reports the feelings have progressed into suicidal thoughts. "I feel like I don't have anything worth living for." Patient states the two have been together over 30 years, cohabitating, and when patient was diagnosed with dementia, the home was seized by the state, patient was sent to SNF and patient had to move out. Patient reports he is currently staying at [facility] under a probationary period. Patient says he is not able to leave the premises nor use his cell phone for 1 month. Patient says he works there daily from [employer]. In addition, patient says he ran out of his psych medications which has increased his depressive symptoms. Patient reports he is being followed by providers at [facility], [name]. Pt denies using any illicit substances and alcohol. Pt reports INPT hx at [facility]. Pt denies current SI/HI/AH/VH. Pt is not able to  contract for safety.    Per Tyra Galley, NP, pt is recommended for inpatient psychiatric admission.    Chief Complaint:  Chief Complaint  Patient presents with   Seizures   Suicidal   Visit Diagnosis: Bipolar disorder  Patient Reported Information How did you hear about us ? Self  What Is the Reason for Your Visit/Call Today? Patient reports he came to the ED for depressive symptoms.  How Long Has This Been Causing You Problems? > than 6 months  What Do You Feel Would Help You the Most Today? Treatment for Depression or other mood problem; Medication(s)   Have You Recently Had Any Thoughts About Hurting Yourself? Yes  Are You Planning to Commit Suicide/Harm Yourself At This time? No   Have you Recently Had Thoughts About Hurting Someone [name]? No  Are You Planning to Harm Someone at This Time? No  Explanation: Patient endorsed having worsening thoughts SI.   Have You Used Any Alcohol or Drugs in the Past 24 Hours? No  How Long Ago Did You Use Drugs or Alcohol? No data recorded What Did You Use and How Much? Marijuana   Do You Currently Have a Therapist/Psychiatrist? Yes  Name of Therapist/Psychiatrist: [facility]gt[facility] [name]   Have You Been Recently Discharged From Any Public relations account executive or Programs? No  Explanation of Discharge From Practice/Program: No data recorded   CCA Screening Triage Referral Assessment Type of Contact: Face-to-Face  Telemedicine Service Delivery:   Is this Initial or Reassessment?   Date Telepsych consult ordered in [facility]:    Time  Telepsych consult ordered in [facility]:    Location of Assessment: [facility] ED  Provider Location: [facility] ED    Collateral Involvement: None provided   Does Patient Have a Court Appointed Legal Guardian? No data recorded Name and Contact of Legal Guardian: No data recorded If Minor and Not Living with Parent(s), Who has Custody? N/A  Is CPS involved or ever been involved? Never  Is APS involved or ever been involved?  Never   Patient Determined To Be At Risk for Harm To Self or Others Based on Review of Patient Reported Information or Presenting Complaint? Yes, for Self-Harm  Method: Plan without intent  Availability of Means: No access or NA  Intent: Vague intent or NA  Notification Required: No need or identified person  Additional Information for Danger to Others Potential: -- (N/A)  Additional Comments for Danger to Others Potential: N/A  Are There Guns or Other Weapons in Your Home? No  Types of Guns/Weapons: N/A  Are These Weapons Safely Secured?                            No  Who Could Verify You Are Able To Have These Secured: N/A  Do You Have any Outstanding Charges, Pending Court Dates, Parole/Probation? None reported  Contacted To Inform of Risk of Harm To Self or Others: Other: Comment (N/A)   Does Patient Present under Involuntary Commitment? No    [place] of Residence: [place]   Patient Currently Receiving the Following Services: Medication Management   Determination of Need: Emergent (2 hours)   Options For Referral: ED Visit; Inpatient Hospitalization; Medication Management   Disposition Recommendation per psychiatric provider: Per Tyra Galley, NP, pt is recommended for inpatient psychiatric admission.    Tarita Deshmukh Willian Harrow, Counselor, LCAS-A

## 2023-05-29 NOTE — ED Notes (Signed)
 Pt given lunch tray by Gregorio Lease EDT

## 2023-05-29 NOTE — ED Provider Notes (Signed)
 [facility] Provider Note    Event Date/Time   First MD Initiated Contact with Patient 05/29/23 [id number]     (approximate)   History   Seizures and Suicidal   HPI  [name] is a 63 y.o. male  seizure disorder, history of noncompliant with seizure medications, major depression disorder, HTN, substance abuse, presented with recurrent seizure and suicidal ideation.  Patient reports that this morning he thinks that he had about 4 seizures.  He reports the seizures are very short.  He states that 3 of them just happened in bed and then one of them he was try to get out of bed when he had the seizure and he fell on a bent left leg.  He does not think he hit his head but does report a little bit of neck pain.  He does report that after seizures that he typically gets really depressed.  He denies any active plan today but does report that in the past he has had plans.  He does report that he supposed be on Depakote  750 twice daily but that he has been running out of it so he is only been taking 500 mg once daily.  Patient does state that he is willing to stay here voluntarily to be evaluated. He denies any alcohol or substance abuse.   Physical Exam   Triage Vital Signs: ED Triage Vitals  Encounter Vitals Group     BP 05/29/23 0858 (!) 161/103     Systolic BP Percentile --      Diastolic BP Percentile --      Pulse Rate 05/29/23 0858 67     Resp 05/29/23 0858 16     Temp 05/29/23 0904 97.9 F (36.6 C)     Temp Source 05/29/23 0904 Oral     SpO2 05/29/23 0858 98 %     Weight 05/29/23 0859 171 lb (77.6 kg)     Height 05/29/23 0859 5\' 9"  (1.753 m)     Head Circumference --      Peak Flow --      Pain Score 05/29/23 0858 8     Pain Loc --      Pain Education --      Exclude from Growth Chart --     Most recent vital signs: Vitals:   05/29/23 0858 05/29/23 0904  BP: (!) 161/103   Pulse: 67   Resp: 16   Temp:  97.9 F (36.6 C)  SpO2: 98%       General: Awake, no distress.  CV:  Good peripheral perfusion.  Resp:  Normal effort.  Abd:  No distention.  Other:  Patient has some bruising on his left tib-fib area.  He is got no tenderness on his foot or his ankle.  No tenderness on the knee Cranial nerves appear intact.  Equal strength in arms and legs.  ED Results / Procedures / Treatments   Labs (all labs ordered are listed, but only abnormal results are displayed) Labs Reviewed  COMPREHENSIVE METABOLIC PANEL WITH GFR  ETHANOL  CBC  URINE DRUG SCREEN, QUALITATIVE ([facility] ONLY)  CK  SALICYLATE LEVEL  ACETAMINOPHEN  LEVEL     EKG  My interpretation of EKG:  Normal sinus rate of 62 without any ST elevation or T wave inversions, normal intervals  RADIOLOGY I have reviewed the xray personally and interpreted no evidence of any fracture   PROCEDURES:  Critical Care performed: No  .1-3 Lead EKG Interpretation  Performed by: Lubertha Rush, MD Authorized by: Lubertha Rush, MD     Interpretation: normal     ECG rate:  60   ECG rate assessment: normal     Rhythm: sinus rhythm     Ectopy: none     Conduction: normal      MEDICATIONS ORDERED IN ED: Medications  divalproex  (DEPAKOTE ) DR tablet 750 mg (has no administration in time range)  oxyCODONE  (Oxy IR/ROXICODONE ) immediate release tablet 5 mg (has no administration in time range)     IMPRESSION / MDM / ASSESSMENT AND PLAN / ED COURSE  I reviewed the triage vital signs and the nursing notes.   Patient's presentation is most consistent with acute presentation with potential threat to life or bodily function.   Patient comes in with concerns for seizure-like activity this morning.  Patient been noncompliant with medications will give him his Depakote .  Will get labs.  CT imaging evaluate for intercranial hemorrhage, cervical fracture, x-ray evaluate for any fracture of the leg.  Patient will be given some fluids given history of rhabdo in the past.  At  this time patient is voluntarily here.  He denies any active plan.  X-rays were negative.  Ammonia level is negative.  Troponin was negative.  CMP shows slightly low bicarb but patient getting some IV fluids.  CBC reassuring.  CK normal.  CT imagine/xray negative.   The patient's has had no seizures here.  Patient is going to be medically cleared after repeat troponin.  He does request seeing psychiatry today for his SI but patient does not require medical admission   The patient has been placed in psychiatric observation due to the need to provide a safe environment for the patient while obtaining psychiatric consultation and evaluation, as well as ongoing medical and medication management to treat the patient's condition.  The patient has not been placed under full IVC at this time.    If patient is cleared by psychiatry I have done a new prescription for Depakote  given he states that he was running out of it and hopefully facilitate medication compliance.   The patient is on the cardiac monitor to evaluate for evidence of arrhythmia and/or significant heart rate changes.      FINAL CLINICAL IMPRESSION(S) / ED DIAGNOSES   Final diagnoses:  Suicidal ideation  Seizure (HCC)     Rx / DC Orders   ED Discharge Orders          Ordered    divalproex  (DEPAKOTE ) 250 MG DR tablet  2 times daily        05/29/23 1128             Note:  This document was prepared using Dragon voice recognition software and may include unintentional dictation errors.   Lubertha Rush, MD 05/29/23 [phone]

## 2023-05-29 NOTE — BH Assessment (Signed)
 IRIS request has been placed for this patient to be seen.

## 2023-05-29 NOTE — ED Triage Notes (Addendum)
 Patient presents with suicidal thoughts of "kill yourself." Plan to hang himself but no intentions of acting on it  Patient reports "I know I had seizures early this morning laying in bed. I am really emotional when I wake up from a seizure"  Reports he had three in bed and one when he got up to go to the bathroom. Reports in the bathroom his leg went out from under him and bent backwards.   A&O x4 in triage. Patient is staying at [facility]

## 2023-05-29 NOTE — ED Notes (Signed)
 Pt taken to CT.

## 2023-05-29 NOTE — ED Notes (Signed)
Pt given a snack and beverage

## 2023-05-29 NOTE — BH Assessment (Signed)
 Destination  Service Provider Request Status Services Address Phone Fax Patient Preferred  [facility] Pending - Request Sent -- [address], [place] [address] [phone] --  [facility] Pending - Request Sent -- [address] [place] [address] [phone] --  [facility] Pending - Request Sent -- [address] Johnella Naas [place] [address] [phone] --  [facility] Axel Lent Pending - Request Sent -- [address] [place] [phone] --  [facility] Pending - Request Sent -- [address] [place] [address] [phone] --  [facility] Pending - Request Sent -- [address] [place] [address] [phone] --  [facility] Pending - Request Sent -- [address] [place] [address] [phone] --  [facility] Pending - Request Sent -- [place] [phone] --  [facility] Patient Placement Pending - Request Sent -- [facility], [place] [phone] --  [facility] Pending - Request Sent -- [address] [place], [place] [address]

## 2023-05-29 NOTE — ED Notes (Signed)
 VOL CONSULT  DONE  PENDING  PLACEMENT
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
## 2023-05-29 NOTE — ED Notes (Signed)
Attempted IV twice with no success. IV team consult placed.  

## 2023-05-29 NOTE — Discharge Instructions (Addendum)
 I have sent in new prescriptions for your Depakote  is important that you take it as prescribed and return to the ER for worsening symptoms or any other concerns

## 2023-05-30 ENCOUNTER — Other Ambulatory Visit: Payer: Self-pay

## 2023-05-30 ENCOUNTER — Inpatient Hospital Stay (HOSPITAL_COMMUNITY)
Admission: AD | Admit: 2023-05-30 | Discharge: 2023-06-08 | DRG: 885 | Disposition: A | Discharge status: Home / Self Care | Payer: MEDICAID | Source: Intra-hospital | Attending: Psychiatry | Admitting: Psychiatry

## 2023-05-30 ENCOUNTER — Encounter (HOSPITAL_COMMUNITY): Payer: Self-pay | Admitting: Adult Health

## 2023-05-30 DIAGNOSIS — Z888 Allergy status to other drugs, medicaments and biological substances status: Secondary | ICD-10-CM

## 2023-05-30 DIAGNOSIS — F03918 Unspecified dementia, unspecified severity, with other behavioral disturbance: Secondary | ICD-10-CM | POA: Diagnosis present

## 2023-05-30 DIAGNOSIS — Z5901 Sheltered homelessness: Secondary | ICD-10-CM

## 2023-05-30 DIAGNOSIS — G2581 Restless legs syndrome: Secondary | ICD-10-CM | POA: Diagnosis present

## 2023-05-30 DIAGNOSIS — Z79899 Other long term (current) drug therapy: Secondary | ICD-10-CM | POA: Diagnosis not present

## 2023-05-30 DIAGNOSIS — F0393 Unspecified dementia, unspecified severity, with mood disturbance: Secondary | ICD-10-CM | POA: Diagnosis present

## 2023-05-30 DIAGNOSIS — F1011 Alcohol abuse, in remission: Secondary | ICD-10-CM | POA: Diagnosis present

## 2023-05-30 DIAGNOSIS — Z560 Unemployment, unspecified: Secondary | ICD-10-CM

## 2023-05-30 DIAGNOSIS — Z8619 Personal history of other infectious and parasitic diseases: Secondary | ICD-10-CM

## 2023-05-30 DIAGNOSIS — Z91148 Patient's other noncompliance with medication regimen for other reason: Secondary | ICD-10-CM

## 2023-05-30 DIAGNOSIS — Z87891 Personal history of nicotine dependence: Secondary | ICD-10-CM

## 2023-05-30 DIAGNOSIS — Z96653 Presence of artificial knee joint, bilateral: Secondary | ICD-10-CM | POA: Diagnosis present

## 2023-05-30 DIAGNOSIS — I10 Essential (primary) hypertension: Secondary | ICD-10-CM | POA: Diagnosis present

## 2023-05-30 DIAGNOSIS — R45851 Suicidal ideations: Secondary | ICD-10-CM | POA: Diagnosis present

## 2023-05-30 DIAGNOSIS — Z87442 Personal history of urinary calculi: Secondary | ICD-10-CM

## 2023-05-30 DIAGNOSIS — F3130 Bipolar disorder, current episode depressed, mild or moderate severity, unspecified: Principal | ICD-10-CM | POA: Diagnosis present

## 2023-05-30 DIAGNOSIS — Z818 Family history of other mental and behavioral disorders: Secondary | ICD-10-CM | POA: Diagnosis not present

## 2023-05-30 DIAGNOSIS — Z823 Family history of stroke: Secondary | ICD-10-CM

## 2023-05-30 DIAGNOSIS — E785 Hyperlipidemia, unspecified: Secondary | ICD-10-CM | POA: Diagnosis present

## 2023-05-30 DIAGNOSIS — G4700 Insomnia, unspecified: Secondary | ICD-10-CM | POA: Diagnosis present

## 2023-05-30 DIAGNOSIS — R569 Unspecified convulsions: Secondary | ICD-10-CM | POA: Diagnosis present

## 2023-05-30 DIAGNOSIS — F4310 Post-traumatic stress disorder, unspecified: Secondary | ICD-10-CM | POA: Diagnosis present

## 2023-05-30 DIAGNOSIS — F319 Bipolar disorder, unspecified: Secondary | ICD-10-CM | POA: Diagnosis not present

## 2023-05-30 DIAGNOSIS — F0394 Unspecified dementia, unspecified severity, with anxiety: Secondary | ICD-10-CM | POA: Diagnosis present

## 2023-05-30 DIAGNOSIS — N400 Enlarged prostate without lower urinary tract symptoms: Secondary | ICD-10-CM | POA: Diagnosis present

## 2023-05-30 DIAGNOSIS — Z604 Social exclusion and rejection: Secondary | ICD-10-CM | POA: Diagnosis present

## 2023-05-30 DIAGNOSIS — Z7982 Long term (current) use of aspirin: Secondary | ICD-10-CM

## 2023-05-30 MED ORDER — MAGNESIUM HYDROXIDE 400 MG/5ML PO SUSP: 30.0000 mL | Freq: Every day | ORAL | Status: DC | PRN

## 2023-05-30 MED ORDER — TRAMADOL HCL 50 MG PO TABS
50.0000 mg | ORAL_TABLET | Freq: Four times a day (QID) | ORAL | Status: DC | PRN
  Administered 2023-05-30 – 2023-06-03 (×6): 50 mg via ORAL
  Filled 2023-05-30 (×6): qty 1

## 2023-05-30 MED ORDER — DIPHENHYDRAMINE HCL 50 MG/ML IJ SOLN: 50.0000 mg | Freq: Three times a day (TID) | INTRAMUSCULAR | Status: DC | PRN

## 2023-05-30 MED ORDER — TAMSULOSIN HCL 0.4 MG PO CAPS
0.4000 mg | ORAL_CAPSULE | Freq: Every day | ORAL | Status: DC
  Administered 2023-05-30 – 2023-06-07 (×9): 0.4 mg via ORAL
  Filled 2023-05-30 (×10): qty 1

## 2023-05-30 MED ORDER — LORAZEPAM 2 MG/ML IJ SOLN: 2.0000 mg | Freq: Three times a day (TID) | INTRAMUSCULAR | Status: DC | PRN

## 2023-05-30 MED ORDER — METOPROLOL SUCCINATE ER 25 MG PO TB24
25.0000 mg | ORAL_TABLET | Freq: Every day | ORAL | Status: DC
  Administered 2023-05-31 – 2023-06-08 (×9): 25 mg via ORAL
  Filled 2023-05-30 (×9): qty 1

## 2023-05-30 MED ORDER — HALOPERIDOL 5 MG PO TABS: 5.0000 mg | ORAL_TABLET | Freq: Three times a day (TID) | ORAL | Status: DC | PRN

## 2023-05-30 MED ORDER — AMIODARONE HCL 200 MG PO TABS
200.0000 mg | ORAL_TABLET | Freq: Every day | ORAL | Status: DC
  Administered 2023-05-31 – 2023-06-08 (×9): 200 mg via ORAL
  Filled 2023-05-30 (×10): qty 1

## 2023-05-30 MED ORDER — HALOPERIDOL LACTATE 5 MG/ML IJ SOLN: 5.0000 mg | Freq: Three times a day (TID) | INTRAMUSCULAR | Status: DC | PRN

## 2023-05-30 MED ORDER — HALOPERIDOL LACTATE 5 MG/ML IJ SOLN: 10.0000 mg | Freq: Three times a day (TID) | INTRAMUSCULAR | Status: DC | PRN

## 2023-05-30 MED ORDER — DIVALPROEX SODIUM 500 MG PO DR TAB
750.0000 mg | DELAYED_RELEASE_TABLET | Freq: Two times a day (BID) | ORAL | Status: DC
  Administered 2023-05-30 – 2023-06-08 (×18): 750 mg via ORAL
  Filled 2023-05-30 (×18): qty 1

## 2023-05-30 MED ORDER — ALUM & MAG HYDROXIDE-SIMETH 200-200-20 MG/5ML PO SUSP: 30.0000 mL | ORAL | Status: DC | PRN

## 2023-05-30 MED ORDER — VENLAFAXINE HCL ER 150 MG PO CP24
150.0000 mg | ORAL_CAPSULE | Freq: Every day | ORAL | Status: DC
  Administered 2023-05-31: 150 mg via ORAL
  Filled 2023-05-30: qty 1

## 2023-05-30 MED ORDER — MELATONIN 5 MG PO TABS
5.0000 mg | ORAL_TABLET | Freq: Every day | ORAL | Status: DC
  Administered 2023-05-30 – 2023-06-06 (×2): 5 mg via ORAL
  Filled 2023-05-30 (×7): qty 1

## 2023-05-30 MED ORDER — DIPHENHYDRAMINE HCL 25 MG PO CAPS: 50.0000 mg | ORAL_CAPSULE | Freq: Three times a day (TID) | ORAL | Status: DC | PRN

## 2023-05-30 MED ORDER — ADULT MULTIVITAMIN W/MINERALS CH
1.0000 | ORAL_TABLET | Freq: Every day | ORAL | Status: DC
  Administered 2023-05-31 – 2023-06-08 (×9): 1 via ORAL
  Filled 2023-05-30 (×9): qty 1

## 2023-05-30 MED ORDER — LEVOCARNITINE 1 GM/10ML PO SOLN
500.0000 mg | Freq: Every day | ORAL | Status: DC
  Administered 2023-05-31: 500 mg via ORAL
  Filled 2023-05-30 (×3): qty 5

## 2023-05-30 MED ORDER — DOXEPIN HCL 25 MG PO CAPS
50.0000 mg | ORAL_CAPSULE | Freq: Every day | ORAL | Status: DC
  Administered 2023-05-30 – 2023-06-07 (×9): 50 mg via ORAL
  Filled 2023-05-30 (×9): qty 2

## 2023-05-30 MED ORDER — ACETAMINOPHEN 325 MG PO TABS
650.0000 mg | ORAL_TABLET | Freq: Four times a day (QID) | ORAL | Status: DC | PRN
  Administered 2023-06-03 – 2023-06-07 (×6): 650 mg via ORAL
  Filled 2023-05-30 (×6): qty 2

## 2023-05-30 MED ORDER — ENSURE ENLIVE PO LIQD
237.0000 mL | Freq: Three times a day (TID) | ORAL | Status: DC
  Administered 2023-05-30 – 2023-06-08 (×11): 237 mL via ORAL
  Filled 2023-05-30 (×23): qty 237

## 2023-05-30 MED ORDER — LURASIDONE HCL 20 MG PO TABS
20.0000 mg | ORAL_TABLET | Freq: Every day | ORAL | Status: DC
  Administered 2023-05-30 – 2023-06-07 (×9): 20 mg via ORAL
  Filled 2023-05-30 (×10): qty 1

## 2023-05-30 NOTE — ED Notes (Signed)
 Patient Voluntary, transferred via safe transport, no signs of distress. All belongings transported with the Patient.

## 2023-05-30 NOTE — ED Notes (Signed)
 Hospital meal provided, pt tolerated w/o complaints.  Waste discarded appropriately.

## 2023-05-30 NOTE — Plan of Care (Signed)
 Pt continue to verbalize passive SI stating; :When I have a seizure it puts those thoughts in my head."

## 2023-05-30 NOTE — ED Notes (Signed)
Vol /pending placement 

## 2023-05-30 NOTE — ED Notes (Signed)
 Called [facility] for transport to [facility]  room 300 bed 2 per Dr. Thirza Fleet   spoke to [place] at transport

## 2023-05-30 NOTE — ED Notes (Signed)
 EMTALA reviewed by this RN.

## 2023-05-30 NOTE — [facility] Group Notes (Signed)
 [facility] Group Notes:  (Nursing/MHT/Case Management/Adjunct)  Date:  05/30/2023  Time:  8:48 PM  Type of Therapy:  Wrap-up group  Participation Level:  Active  Participation Quality:  Appropriate  Affect:  Appropriate  Cognitive:  Appropriate  Insight:  Appropriate  Engagement in Group:  Engaged  Modes of Intervention:  Education  Summary of Progress/Problems: Goal to get admitted here. Rated his day 9/10.  [name] 05/30/2023, 8:48 PM

## 2023-05-30 NOTE — Tx Team (Signed)
 Initial Treatment Plan 05/30/2023 7:42 PM [name] Colon [name]    PATIENT STRESSORS: Health problems   Medication change or noncompliance   Traumatic event     PATIENT STRENGTHS: Average or above average intelligence  Capable of independent living  Communication skills  Motivation for treatment/growth  Work skills    PATIENT IDENTIFIED PROBLEMS: Suicide ideation    PTSD    Feelings of loneliness    Command AH- "telling me to kill myself and join him"         DISCHARGE CRITERIA:  Improved stabilization in mood, thinking, and/or behavior Reduction of life-threatening or endangering symptoms to within safe limits Verbal commitment to aftercare and medication compliance  PRELIMINARY DISCHARGE PLAN: Outpatient therapy Return to previous living arrangement  PATIENT/FAMILY INVOLVEMENT: This treatment plan has been presented to and reviewed with the patient, [name],   The patient has been given the opportunity to ask questions and make suggestions.  [name], RN 05/30/2023, 7:42 PM

## 2023-05-30 NOTE — ED Notes (Signed)
 Called [facility] for transport to [facility]  room 300 bed 2 per Dr. Zouev   1424

## 2023-05-30 NOTE — ED Notes (Signed)
 Patient is alert and oriented, just wanted His belongings prior to transfer, He is calm and cooperative,no Si/hi or avh noted at this time.

## 2023-05-30 NOTE — Plan of Care (Signed)
   Problem: Education: Goal: Knowledge of [facility] Education information/materials will improve Outcome: Progressing Goal: Verbalization of understanding the information provided will improve Outcome: Progressing

## 2023-05-30 NOTE — BH Assessment (Signed)
 Patient has been accepted to [facility] on today 05/30/23. Patient assigned to room 300, bed# 2. Accepting physician is Dr. Zouev.  Call report to [phone].  Representative was [employer].   ER Staff is aware of it:  Museum/gallery exhibitions officer  Dr. Hendrick Locke, ER MD  Satira Curet, Patient's Nurse
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
## 2023-05-30 NOTE — ED Notes (Signed)
VOL/Pending Placement 

## 2023-05-30 NOTE — ED Notes (Signed)
 Pt moved to room 24.  Report off to [employer].

## 2023-05-31 ENCOUNTER — Encounter (HOSPITAL_COMMUNITY): Payer: Self-pay

## 2023-05-31 DIAGNOSIS — F319 Bipolar disorder, unspecified: Secondary | ICD-10-CM | POA: Diagnosis not present

## 2023-05-31 MED ORDER — ESCITALOPRAM OXALATE 5 MG PO TABS
5.0000 mg | ORAL_TABLET | Freq: Every day | ORAL | Status: DC
  Administered 2023-05-31: 5 mg via ORAL
  Filled 2023-05-31: qty 1
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
## 2023-05-31 NOTE — Progress Notes (Signed)
   05/31/23 0800  Psych Admission Type (Psych Patients Only)  Admission Status Voluntary  Psychosocial Assessment  Patient Complaints Loneliness;Hopelessness  Eye Contact Fair  Facial Expression Flat  Affect Appropriate to circumstance  Speech Logical/coherent  Interaction Evasive  Motor Activity Slow  Appearance/Hygiene Disheveled  Behavior Characteristics Cooperative;Calm  Mood Sad  Thought Process  Coherency WDL  Content WDL  Delusions None reported or observed  Perception WDL  Hallucination None reported or observed  Judgment WDL  Confusion None  Danger to Self  Current suicidal ideation? Passive  Description of Suicide Plan None  Agreement Not to Harm Self Yes  Description of Agreement Verbal Contract  Danger to Others  Danger to Others None reported or observed     05/31/23 0800  Psych Admission Type (Psych Patients Only)  Admission Status Voluntary  Psychosocial Assessment  Patient Complaints Loneliness;Hopelessness  Eye Contact Fair  Facial Expression Flat  Affect Appropriate to circumstance  Speech Logical/coherent  Interaction Evasive  Motor Activity Slow  Appearance/Hygiene Disheveled  Behavior Characteristics Cooperative;Calm  Mood Sad  Thought Process  Coherency WDL  Content WDL  Delusions None reported or observed  Perception WDL  Hallucination None reported or observed  Judgment WDL  Confusion None  Danger to Self  Current suicidal ideation? Passive  Description of Suicide Plan None  Agreement Not to Harm Self Yes  Description of Agreement Verbal Contract  Danger to Others  Danger to Others None reported or observed

## 2023-05-31 NOTE — BH Assessment (Signed)
 7829 Pt V/S taken with hypotension BP: 69/54 P: 82; Manual BP: 112/72. Pt asked when he last at or drank. Pt advised he had not eaten in 24 hrs. Fluids were pushed and asked him to go to breakfast. Pt refused breakfast and went to lay down in his room. AC made aware. Passed off to On-Coming nurse. No noted distress. Passing on care to On-Coming Nurse.

## 2023-05-31 NOTE — BH IP Treatment Plan (Unsigned)
 Interdisciplinary Treatment and Diagnostic Plan Update  05/31/2023 Time of Session: *** [name] MRN: [id number]  Principal Diagnosis: Bipolar 1 disorder, depressed (HCC)  Secondary Diagnoses: Principal Problem:   Bipolar 1 disorder, depressed (HCC)   Current Medications:  Current Facility-Administered Medications  Medication Dose Route Frequency Provider Last Rate Last Admin   acetaminophen  (TYLENOL ) tablet 650 mg  650 mg Oral Q6H PRN Mills, Shnese E, NP       alum & mag hydroxide-simeth (MAALOX/MYLANTA) 200-200-20 MG/5ML suspension 30 mL  30 mL Oral Q4H PRN Mills, Shnese E, NP       amiodarone  (PACERONE ) tablet 200 mg  200 mg Oral Daily Mills, Shnese E, NP   200 mg at 05/31/23 0758   haloperidol  (HALDOL ) tablet 5 mg  5 mg Oral TID PRN Doneen Fuelling, NP       And   diphenhydrAMINE  (BENADRYL ) capsule 50 mg  50 mg Oral TID PRN Mills, Shnese E, NP       haloperidol  lactate (HALDOL ) injection 5 mg  5 mg Intramuscular TID PRN Doneen Fuelling, NP       And   diphenhydrAMINE  (BENADRYL ) injection 50 mg  50 mg Intramuscular TID PRN Doneen Fuelling, NP       And   LORazepam  (ATIVAN ) injection 2 mg  2 mg Intramuscular TID PRN Mills, Shnese E, NP       haloperidol  lactate (HALDOL ) injection 10 mg  10 mg Intramuscular TID PRN [name], NP       And   diphenhydrAMINE  (BENADRYL ) injection 50 mg  50 mg Intramuscular TID PRN [name], NP       And   LORazepam  (ATIVAN ) injection 2 mg  2 mg Intramuscular TID PRN Mills, Shnese E, NP       divalproex  (DEPAKOTE ) DR tablet 750 mg  750 mg Oral BID Mills, Shnese E, NP   750 mg at 05/31/23 0757   doxepin  (SINEQUAN ) capsule 50 mg  50 mg Oral QHS Mills, Shnese E, NP   50 mg at 05/30/23 2051   feeding supplement (ENSURE ENLIVE / ENSURE PLUS) liquid 237 mL  237 mL Oral TID BM Mills, Shnese E, NP   237 mL at 05/30/23 2051   levOCARNitine  (CARNITOR ) 1 GM/10ML solution 500 mg  500 mg Oral Daily Mills, Shnese E, NP   500 mg at 05/31/23 0757    lurasidone  (LATUDA ) tablet 20 mg  20 mg Oral Q supper Mills, Shnese E, NP   20 mg at 05/30/23 2050   magnesium  hydroxide (MILK OF MAGNESIA) suspension 30 mL  30 mL Oral Daily PRN Mills, Shnese E, NP       melatonin tablet 5 mg  5 mg Oral QHS Mills, Shnese E, NP   5 mg at 05/30/23 2048   metoprolol  succinate (TOPROL -XL) 24 hr tablet 25 mg  25 mg Oral Daily Mills, Shnese E, NP   25 mg at 05/31/23 0758   multivitamin with minerals tablet 1 tablet  1 tablet Oral Daily [name], NP   1 tablet at 05/31/23 0758   tamsulosin  (FLOMAX ) capsule 0.4 mg  0.4 mg Oral QPC supper Mills, Shnese E, NP   0.4 mg at 05/30/23 2048   traMADol  (ULTRAM ) tablet 50 mg  50 mg Oral Q6H PRN Mills, Shnese E, NP   50 mg at 05/30/23 1706   venlafaxine  XR (EFFEXOR -XR) 24 hr capsule 150 mg  150 mg Oral QPC breakfast Doneen Fuelling, NP  PTA Medications: Medications Prior to Admission  Medication Sig Dispense Refill Last Dose/Taking   amiodarone  (PACERONE ) 200 MG tablet Take 1 tablet (200 mg total) by mouth 2 (two) times daily for 7 days, THEN 1 tablet (200 mg total) daily. 44 tablet 0    aspirin  EC 81 MG tablet Take 1 tablet (81 mg total) by mouth daily. Swallow whole.      divalproex  (DEPAKOTE ) 250 MG DR tablet Take 3 tablets (750 mg total) by mouth 2 (two) times daily. 180 tablet 0    doxepin  (SINEQUAN ) 50 MG capsule Take 1 capsule (50 mg total) by mouth at bedtime. 30 capsule 0    feeding supplement (ENSURE ENLIVE / ENSURE PLUS) LIQD Take 237 mLs by mouth 3 (three) times daily between meals. 237 mL 12    levOCARNitine  (CARNITOR ) 1 GM/10ML solution Take 5 mLs (500 mg total) by mouth daily.      lurasidone  (LATUDA ) 20 MG TABS tablet Take 4 tablets (80 mg total) by mouth daily with supper. (Patient taking differently: Take 20 mg by mouth daily with supper.) 120 tablet 0    melatonin 5 MG TABS Take 1 tablet (5 mg total) by mouth at bedtime. 30 tablet 0    metoprolol  succinate (TOPROL -XL) 25 MG 24 hr tablet Take 1  tablet (25 mg total) by mouth daily. 30 tablet 2    Multiple Vitamin (MULTIVITAMIN WITH MINERALS) TABS tablet Take 1 tablet by mouth daily. 30 tablet 0    tamsulosin  (FLOMAX ) 0.4 MG CAPS capsule Take 1 capsule (0.4 mg total) by mouth daily after supper.      traMADol  (ULTRAM ) 50 MG tablet Take 1 tablet (50 mg total) by mouth every 6 (six) hours as needed for severe pain (pain score 7-10). 30 tablet 0    venlafaxine  XR (EFFEXOR -XR) 150 MG 24 hr capsule Take 1 capsule (150 mg total) by mouth daily after breakfast. 30 capsule 0     Patient Stressors: Health problems   Medication change or noncompliance   Traumatic event    Patient Strengths: Average or above average intelligence  Capable of independent living  Communication skills  Motivation for treatment/growth  Work skills   Treatment Modalities: Medication Management, Group therapy, Case management,  1 to 1 session with clinician, Psychoeducation, Recreational therapy.   Physician Treatment Plan for Primary Diagnosis: Bipolar 1 disorder, depressed (HCC) Long Term Goal(s):     Short Term Goals:    Medication Management: Evaluate patient's response, side effects, and tolerance of medication regimen.  Therapeutic Interventions: 1 to 1 sessions, Unit Group sessions and Medication administration.  Evaluation of Outcomes: Not Progressing  Physician Treatment Plan for Secondary Diagnosis: Principal Problem:   Bipolar 1 disorder, depressed (HCC)  Long Term Goal(s):     Short Term Goals:       Medication Management: Evaluate patient's response, side effects, and tolerance of medication regimen.  Therapeutic Interventions: 1 to 1 sessions, Unit Group sessions and Medication administration.  Evaluation of Outcomes: Not Progressing   RN Treatment Plan for Primary Diagnosis: Bipolar 1 disorder, depressed (HCC) Long Term Goal(s): Knowledge of disease and therapeutic regimen to maintain health will improve  Short Term Goals:  Ability to remain free from injury will improve, Ability to verbalize frustration and anger appropriately will improve, Ability to demonstrate self-control, Ability to participate in decision making will improve, Ability to verbalize feelings will improve, Ability to disclose and discuss suicidal ideas, Ability to identify and develop effective coping behaviors will improve, and Compliance  with prescribed medications will improve  Medication Management: RN will administer medications as ordered by provider, will assess and evaluate patient's response and provide education to patient for prescribed medication. RN will report any adverse and/or side effects to prescribing provider.  Therapeutic Interventions: 1 on 1 counseling sessions, Psychoeducation, Medication administration, Evaluate responses to treatment, Monitor vital signs and CBGs as ordered, Perform/monitor CIWA, COWS, AIMS and Fall Risk screenings as ordered, Perform wound care treatments as ordered.  Evaluation of Outcomes: Not Progressing   LCSW Treatment Plan for Primary Diagnosis: Bipolar 1 disorder, depressed (HCC) Long Term Goal(s): Safe transition to appropriate next level of care at discharge, Engage patient in therapeutic group addressing interpersonal concerns.  Short Term Goals: Engage patient in aftercare planning with referrals and resources, Increase social support, Increase ability to appropriately verbalize feelings, Increase emotional regulation, Facilitate acceptance of mental health diagnosis and concerns, Facilitate patient progression through stages of change regarding substance use diagnoses and concerns, Identify triggers associated with mental health/substance abuse issues, and Increase skills for wellness and recovery  Therapeutic Interventions: Assess for all discharge needs, 1 to 1 time with Social worker, Explore available resources and support systems, Assess for adequacy in community support network, Educate  family and significant other(s) on suicide prevention, Complete Psychosocial Assessment, Interpersonal group therapy.  Evaluation of Outcomes: Not Progressing   Progress in Treatment: Attending groups: Yes. Participating in groups: Yes. Taking medication as prescribed: Yes. Toleration medication: Yes. Family/Significant other contact made: No, will contact:  PSA/CONSENTS PENDING Patient understands diagnosis: Yes. Discussing patient identified problems/goals with staff: Yes. Medical problems stabilized or resolved: Yes. Denies suicidal/homicidal ideation: Yes. Issues/concerns per patient self-inventory: No. Other: NONE  New problem(s) identified: No, Describe:  NONE  New Short Term/Long Term Goal(s): medication stabilization, elimination of SI thoughts, development of comprehensive mental wellness plan.   Patient Goals:    Discharge Plan or Barriers: Patient recently admitted. CSW will continue to follow and assess for appropriate referrals and possible discharge planning.    Reason for Continuation of Hospitalization: Anxiety Depression Medication stabilization Other; describe mood stabilization, discharge planning  Estimated Length of Stay: 3-5 DAYS  Last 3 [place] Suicide Severity Risk Score: Flowsheet Row [facility] (Current[facility]29/2023 in [facility] ED to Hosp-Admission (Discharged) from 04/12/2023 in [facility]  C-SSRS RISK CATEGORY Moderate Risk Moderate Risk High Risk       Last PHQ 2/9 Scores:     No data to display          Scribe for Treatment Team: Quinlan Mcfall N Sharayah Renfrow, LCSW 05/31/2023 8:25 AM

## 2023-05-31 NOTE — BH IP Treatment Plan (Unsigned)
 Interdisciplinary Treatment and Diagnostic Plan Update  05/31/2023 Time of Session: 1113 [name] MRN: [id number]  Principal Diagnosis: Bipolar 1 disorder, depressed (HCC)  Secondary Diagnoses: Principal Problem:   Bipolar 1 disorder, depressed (HCC)   Current Medications:  Current Facility-Administered Medications  Medication Dose Route Frequency Provider Last Rate Last Admin   acetaminophen  (TYLENOL ) tablet 650 mg  650 mg Oral Q6H PRN Mills, Shnese E, NP       alum & mag hydroxide-simeth (MAALOX/MYLANTA) 200-200-20 MG/5ML suspension 30 mL  30 mL Oral Q4H PRN Mills, Shnese E, NP       amiodarone  (PACERONE ) tablet 200 mg  200 mg Oral Daily Mills, Shnese E, NP   200 mg at 05/31/23 4098   haloperidol  (HALDOL ) tablet 5 mg  5 mg Oral TID PRN Doneen Fuelling, NP       And   diphenhydrAMINE  (BENADRYL ) capsule 50 mg  50 mg Oral TID PRN Mills, Shnese E, NP       haloperidol  lactate (HALDOL ) injection 5 mg  5 mg Intramuscular TID PRN Doneen Fuelling, NP       And   diphenhydrAMINE  (BENADRYL ) injection 50 mg  50 mg Intramuscular TID PRN Doneen Fuelling, NP       And   LORazepam  (ATIVAN ) injection 2 mg  2 mg Intramuscular TID PRN Mills, Shnese E, NP       haloperidol  lactate (HALDOL ) injection 10 mg  10 mg Intramuscular TID PRN Doneen Fuelling, NP       And   diphenhydrAMINE  (BENADRYL ) injection 50 mg  50 mg Intramuscular TID PRN [name], NP       And   LORazepam  (ATIVAN ) injection 2 mg  2 mg Intramuscular TID PRN Mills, Shnese E, NP       divalproex  (DEPAKOTE ) DR tablet 750 mg  750 mg Oral BID Mills, Shnese E, NP   750 mg at 05/31/23 0757   doxepin  (SINEQUAN ) capsule 50 mg  50 mg Oral QHS Mills, Shnese E, NP   50 mg at 05/30/23 2051   feeding supplement (ENSURE ENLIVE / ENSURE PLUS) liquid 237 mL  237 mL Oral TID BM Mills, Shnese E, NP   237 mL at 05/30/23 2051   levOCARNitine  (CARNITOR ) 1 GM/10ML solution 500 mg  500 mg Oral Daily Mills, Shnese E, NP   500 mg at 05/31/23 0757    lurasidone  (LATUDA ) tablet 20 mg  20 mg Oral Q supper Mills, Shnese E, NP   20 mg at 05/30/23 2050   magnesium  hydroxide (MILK OF MAGNESIA) suspension 30 mL  30 mL Oral Daily PRN Mills, Shnese E, NP       melatonin tablet 5 mg  5 mg Oral QHS Mills, Shnese E, NP   5 mg at 05/30/23 2048   metoprolol  succinate (TOPROL -XL) 24 hr tablet 25 mg  25 mg Oral Daily Mills, Shnese E, NP   25 mg at 05/31/23 0758   multivitamin with minerals tablet 1 tablet  1 tablet Oral Daily [name], NP   1 tablet at 05/31/23 0758   tamsulosin  (FLOMAX ) capsule 0.4 mg  0.4 mg Oral QPC supper Mills, Shnese E, NP   0.4 mg at 05/30/23 2048   traMADol  (ULTRAM ) tablet 50 mg  50 mg Oral Q6H PRN Mills, Shnese E, NP   50 mg at 05/30/23 1706   venlafaxine  XR (EFFEXOR -XR) 24 hr capsule 150 mg  150 mg Oral QPC breakfast Doneen Fuelling, NP  150 mg at 05/31/23 2956   PTA Medications: Medications Prior to Admission  Medication Sig Dispense Refill Last Dose/Taking   amiodarone  (PACERONE ) 200 MG tablet Take 1 tablet (200 mg total) by mouth 2 (two) times daily for 7 days, THEN 1 tablet (200 mg total) daily. 44 tablet 0    aspirin  EC 81 MG tablet Take 1 tablet (81 mg total) by mouth daily. Swallow whole.      divalproex  (DEPAKOTE ) 250 MG DR tablet Take 3 tablets (750 mg total) by mouth 2 (two) times daily. 180 tablet 0    doxepin  (SINEQUAN ) 50 MG capsule Take 1 capsule (50 mg total) by mouth at bedtime. 30 capsule 0    feeding supplement (ENSURE ENLIVE / ENSURE PLUS) LIQD Take 237 mLs by mouth 3 (three) times daily between meals. 237 mL 12    levOCARNitine  (CARNITOR ) 1 GM/10ML solution Take 5 mLs (500 mg total) by mouth daily.      lurasidone  (LATUDA ) 20 MG TABS tablet Take 4 tablets (80 mg total) by mouth daily with supper. (Patient taking differently: Take 20 mg by mouth daily with supper.) 120 tablet 0    melatonin 5 MG TABS Take 1 tablet (5 mg total) by mouth at bedtime. 30 tablet 0    metoprolol  succinate (TOPROL -XL) 25 MG  24 hr tablet Take 1 tablet (25 mg total) by mouth daily. 30 tablet 2    Multiple Vitamin (MULTIVITAMIN WITH MINERALS) TABS tablet Take 1 tablet by mouth daily. 30 tablet 0    tamsulosin  (FLOMAX ) 0.4 MG CAPS capsule Take 1 capsule (0.4 mg total) by mouth daily after supper.      traMADol  (ULTRAM ) 50 MG tablet Take 1 tablet (50 mg total) by mouth every 6 (six) hours as needed for severe pain (pain score 7-10). 30 tablet 0    venlafaxine  XR (EFFEXOR -XR) 150 MG 24 hr capsule Take 1 capsule (150 mg total) by mouth daily after breakfast. 30 capsule 0     Patient Stressors: Health problems   Medication change or noncompliance   Traumatic event    Patient Strengths: Average or above average intelligence  Capable of independent living  Communication skills  Motivation for treatment/growth  Work skills   Treatment Modalities: Medication Management, Group therapy, Case management,  1 to 1 session with clinician, Psychoeducation, Recreational therapy.   Physician Treatment Plan for Primary Diagnosis: Bipolar 1 disorder, depressed (HCC) Long Term Goal(s): Improvement in symptoms so as ready for discharge   Short Term Goals: Ability to identify and develop effective coping behaviors will improve Ability to maintain clinical measurements within normal limits will improve Compliance with prescribed medications will improve Ability to identify triggers associated with substance abuse/mental health issues will improve Ability to identify changes in lifestyle to reduce recurrence of condition will improve Ability to verbalize feelings will improve Ability to disclose and discuss suicidal ideas Ability to demonstrate self-control will improve  Medication Management: Evaluate patient's response, side effects, and tolerance of medication regimen.  Therapeutic Interventions: 1 to 1 sessions, Unit Group sessions and Medication administration.  Evaluation of Outcomes: Not Progressing  Physician  Treatment Plan for Secondary Diagnosis: Principal Problem:   Bipolar 1 disorder, depressed (HCC)  Long Term Goal(s): Improvement in symptoms so as ready for discharge   Short Term Goals: Ability to identify and develop effective coping behaviors will improve Ability to maintain clinical measurements within normal limits will improve Compliance with prescribed medications will improve Ability to identify triggers associated with substance abuse/mental health  issues will improve Ability to identify changes in lifestyle to reduce recurrence of condition will improve Ability to verbalize feelings will improve Ability to disclose and discuss suicidal ideas Ability to demonstrate self-control will improve     Medication Management: Evaluate patient's response, side effects, and tolerance of medication regimen.  Therapeutic Interventions: 1 to 1 sessions, Unit Group sessions and Medication administration.  Evaluation of Outcomes: Not Progressing   RN Treatment Plan for Primary Diagnosis: Bipolar 1 disorder, depressed (HCC) Long Term Goal(s): Knowledge of disease and therapeutic regimen to maintain health will improve  Short Term Goals: Ability to remain free from injury will improve, Ability to verbalize frustration and anger appropriately will improve, Ability to demonstrate self-control, Ability to participate in decision making will improve, Ability to verbalize feelings will improve, Ability to disclose and discuss suicidal ideas, Ability to identify and develop effective coping behaviors will improve, and Compliance with prescribed medications will improve  Medication Management: RN will administer medications as ordered by provider, will assess and evaluate patient's response and provide education to patient for prescribed medication. RN will report any adverse and/or side effects to prescribing provider.  Therapeutic Interventions: 1 on 1 counseling sessions, Psychoeducation, Medication  administration, Evaluate responses to treatment, Monitor vital signs and CBGs as ordered, Perform/monitor CIWA, COWS, AIMS and Fall Risk screenings as ordered, Perform wound care treatments as ordered.  Evaluation of Outcomes: Not Progressing   LCSW Treatment Plan for Primary Diagnosis: Bipolar 1 disorder, depressed (HCC) Long Term Goal(s): Safe transition to appropriate next level of care at discharge, Engage patient in therapeutic group addressing interpersonal concerns.  Short Term Goals: Engage patient in aftercare planning with referrals and resources, Increase social support, Increase ability to appropriately verbalize feelings, Increase emotional regulation, Facilitate acceptance of mental health diagnosis and concerns, Facilitate patient progression through stages of change regarding substance use diagnoses and concerns, Identify triggers associated with mental health/substance abuse issues, and Increase skills for wellness and recovery  Therapeutic Interventions: Assess for all discharge needs, 1 to 1 time with Social worker, Explore available resources and support systems, Assess for adequacy in community support network, Educate family and significant other(s) on suicide prevention, Complete Psychosocial Assessment, Interpersonal group therapy.  Evaluation of Outcomes: Not Progressing   Progress in Treatment: Attending groups: Yes. Participating in groups: Yes. Taking medication as prescribed: Yes. Toleration medication: Yes. Family/Significant other contact made: No, will contact:  consents pending Patient understands diagnosis: Yes. Discussing patient identified problems/goals with staff: Yes. Medical problems stabilized or resolved: Yes. Denies suicidal/homicidal ideation: Yes. Endorses passive SI. Issues/concerns per patient self-inventory: No.  New problem(s) identified: No, Describe:  none  New Short Term/Long Term Goal(s): medication stabilization, elimination of SI  thoughts, development of comprehensive mental wellness plan.    Patient Goals:  "Not have suicidal thoughts, it's been going on for months. Lower depression and anxiety"  Discharge Plan or Barriers: Patient recently admitted. CSW will continue to follow and assess for appropriate referrals and possible discharge planning.    Reason for Continuation of Hospitalization: Anxiety Depression Medication stabilization Suicidal ideation  Estimated Length of Stay: 5-7 days  Last 3 [place] Suicide Severity Risk Score: Flowsheet Row Ad[facility] (Current) [facility] from 05/29/2023 in [facility] at [facility] to Hosp-Admission (Discharged) from 04/12/2023 in [facility]  C-SSRS RISK CATEGORY Moderate Risk Moderate Risk High Risk       Last PHQ 2/9 Scores:  No data to display          Scribe for Treatment Team: Vonzell Guerin, Milinda Allen 05/31/2023 1:23 PM

## 2023-05-31 NOTE — Progress Notes (Signed)
   05/30/23 2050  Psych Admission Type (Psych Patients Only)  Admission Status Voluntary  Psychosocial Assessment  Patient Complaints Loneliness;Hopelessness  Eye Contact Fair  Facial Expression Flat  Affect Appropriate to circumstance  Speech Logical/coherent  Interaction Evasive  Motor Activity Other (Comment) (WNL)  Appearance/Hygiene Other (Comment) (Appropriate)  Behavior Characteristics Calm;Cooperative  Mood Sad  Thought Process  Coherency WDL  Content WDL  Delusions None reported or observed  Perception WDL  Hallucination None reported or observed  Judgment WDL  Confusion None  Danger to Self  Current suicidal ideation? Passive  Description of Suicide Plan None (Following seizure activity Pt said;"No longer want to live.")  Agreement Not to Harm Self Yes  Description of Agreement Notify Staff.

## 2023-05-31 NOTE — Progress Notes (Signed)
   05/30/23 2340  What Happened  Was fall witnessed? No  Was patient injured? No  Patient found other (Comment) (Pt said; "I went to the bathroom fell and came back to bed.")  Found by No one-pt stated they fell  Stated prior activity ambulating-unassisted  Provider Notification  Provider Name/Title Dorthea Gauze MD  Date Provider Notified 05/30/23  Time Provider Notified 2358  Method of Notification  (Message)  Notification Reason Fall  Provider response  (Pending)  Date of Provider Response 05/31/23  Time of Provider Response 0006  Follow Up  Family notified No - patient refusal  Time family notified  (N/A)  Additional tests No  Simple treatment Other (comment)  Progress note created (see row info)  (N/A)  Adult Fall Risk Assessment  Risk Factor Category (scoring not indicated) History of more than one fall within 6 months before admission (document High fall risk)  Patient Fall Risk Level High fall risk  Adult Fall Risk Interventions  Required Bundle Interventions *See Row Information* High fall risk - low, moderate, and high requirements implemented  Additional Interventions Room near nurses station  Fall intervention(s) refused/Patient educated regarding refusal Nonskid socks;Yellow bracelet  Screening for Fall Injury Risk (To be completed on HIGH fall risk patients) - Assessing Need for Floor Mats  Risk For Fall Injury- Criteria for Floor Mats Admitted as a result of a fall (Noncompliant with seizure medication prior to admission)  Will Implement Floor Mats  (No)  Vitals  Temp (!) 96.7 F (35.9 C)  Temp Source Temporal  BP (!) 81/59  BP Location Left Wrist  Patient Position (if appropriate) Lying  Pulse Rate Source Radial  Oxygen Therapy  SpO2 95 %  O2 Device Room Air  Pain Assessment  Pain Scale 0-10  Pain Score 0  Neurological  Neuro (WDL) WDL  Level of Consciousness Alert  Orientation Level Oriented X4  Cognition Appropriate at baseline  Speech Clear   Musculoskeletal  Musculoskeletal (WDL) WDL  Integumentary  Integumentary (WDL) WDL

## 2023-05-31 NOTE — Progress Notes (Signed)
 Social worker called patient's legal guardian, Waverly Hageman [phone], but was unable to reach her. Left a voicemail asking her to return call at earliest convenience.    Keron Neenan, LCSWA

## 2023-05-31 NOTE — Progress Notes (Signed)
   05/30/23 2340  What Happened  Was fall witnessed? No  Was patient injured? No  Patient found other (Comment) (Pt said; "I went to the bathroom fell and came back to bed.")  Found by No one-pt stated they fell  Stated prior activity ambulating-unassisted  Provider Notification  Provider Name/Title Dorthea Gauze MD  Date Provider Notified 05/30/23  Time Provider Notified 2358  Method of Notification  (Message/chat)  Notification Reason Fall  Provider response  (Manual BP & protocol of fall)  Date of Provider Response 05/31/23  Time of Provider Response 0006  Follow Up  Family notified No - patient refusal  Time family notified  (N/A)  Additional tests No  Simple treatment Other (comment)  Progress note created (see row info) Yes  Adult Fall Risk Assessment  Risk Factor Category (scoring not indicated) History of more than one fall within 6 months before admission (document High fall risk)  Patient Fall Risk Level High fall risk  Adult Fall Risk Interventions  Required Bundle Interventions *See Row Information* High fall risk - low, moderate, and high requirements implemented  Additional Interventions Room near nurses station  Fall intervention(s) refused/Patient educated regarding refusal Nonskid socks;Yellow bracelet  Screening for Fall Injury Risk (To be completed on HIGH fall risk patients) - Assessing Need for Floor Mats  Risk For Fall Injury- Criteria for Floor Mats Admitted as a result of a fall;None identified - No additional interventions needed (Noncompliant with seizure medication prior to admission)  Will Implement Floor Mats  (No)  Vitals  Temp (!) 96.7 F (35.9 C)  Temp Source Temporal  BP (!) 81/59  BP Location Left Wrist  Patient Position (if appropriate) Lying  Pulse Rate Source Radial  Oxygen Therapy  SpO2 95 %  O2 Device Room Air  Pain Assessment  Pain Scale 0-10  Pain Score 0  Neurological  Neuro (WDL) WDL  Level of Consciousness Alert   Orientation Level Oriented X4  Cognition Appropriate at baseline  Speech Clear  Musculoskeletal  Musculoskeletal (WDL) WDL  Integumentary  Integumentary (WDL) WDL

## 2023-05-31 NOTE — BH Assessment (Signed)
 2330 Pt A/O x4 with noted Depression/SI with the plan to hang self. Pt notified Tech: "I fell when I went to the bathroom." Pt assessed. Pt said; "I fell this morning." Pt denied any head injuries or pain/discomfort. 2340 V/S: BP 81/59; P: 65; R: 16; T: 96.7; O2 Sat: 95% RA. Victor Backbone MD notified. Post Fall assessment completed. Automotive engineer made aware. No noted distress. Continuing of care during 7p-7a shift.

## 2023-05-31 NOTE — Group Note (Signed)
 Recreation Therapy Group Note   Group Topic:Communication  Group Date: 05/31/2023 Start Time: 0981 End Time: 1000 Facilitators: Estevon Fluke-McCall, LRT,CTRS Location: 300 Hall Dayroom   Group Topic: Communication, Team Building, Problem Solving  Goal Area(s) Addresses:  Patient will effectively work with peer towards shared goal.  Patient will identify skills used to make activity successful.  Patient will identify how skills used during activity can be applied to reach post d/c goals.   Behavioral Response: Appropriate  Intervention: STEM Activity- Glass blower/designer  Activity: Tallest [employer]. In teams of 5-6, patients were given 11 craft pipe cleaners. Using the materials provided, patients were instructed to compete again the opposing team(s) to build the tallest free-standing structure from floor level. The activity was timed; difficulty increased by Clinical research associate as Production designer, theatre/television/film continued.  Systematically resources were removed with additional directions for example, placing one arm behind their back, working in silence, and shape stipulations. LRT facilitated post-activity discussion reviewing team processes and necessary communication skills involved in completion. Patients were encouraged to reflect how the skills utilized, or not utilized, in this activity can be incorporated to positively impact support systems post discharge.  Education: Pharmacist, community, Scientist, physiological, Discharge Planning   Education Outcome: Acknowledges education/In group clarification offered/Needs additional education.    Affect/Mood: Appropriate   Participation Level: Active   Participation Quality: Independent   Behavior: Appropriate   Speech/Thought Process: Focused   Insight: Moderate   Judgement: Moderate   Modes of Intervention: STEM Activity   Patient Response to Interventions:  Engaged   Education Outcome:  In group clarification offered    Clinical  Observations/Individualized Feedback: Pt was quiet but active during group. Pt appeared to respond more the suggestions of peers than offer any suggestions of his own.     Plan: Continue to engage patient in RT group sessions 2-3x/week.   Fabiha Rougeau-McCall, LRT,CTRS 05/31/2023 12:16 PM
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
## 2023-05-31 NOTE — Progress Notes (Signed)
 Adult Psychoeducational Group Note  Date:  05/31/2023 Time:  8:04 PM  Group Topic/Focus:  Wrap-Up Group:   The focus of this group is to help patients review their daily goal of treatment and discuss progress on daily workbooks.  Participation Level:  Active  Participation Quality:  Appropriate  Affect:  Appropriate  Cognitive:  Appropriate  Insight: Appropriate  Engagement in Group:  Engaged  Modes of Intervention:  Discussion  Additional Comments:  [name] attend NA wrap up group.  [name] 05/31/2023, 8:04 PM
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
## 2023-05-31 NOTE — BH IP Treatment Plan (Signed)
 Interdisciplinary Treatment and Diagnostic Plan Update  05/31/2023 Time of Session: 1113 [name] MRN: [id number]  Principal Diagnosis: Bipolar 1 disorder, depressed (HCC)  Secondary Diagnoses: Principal Problem:   Bipolar 1 disorder, depressed (HCC)   Current Medications:  Current Facility-Administered Medications  Medication Dose Route Frequency Provider Last Rate Last Admin   acetaminophen  (TYLENOL ) tablet 650 mg  650 mg Oral Q6H PRN Mills, Shnese E, NP       alum & mag hydroxide-simeth (MAALOX/MYLANTA) 200-200-20 MG/5ML suspension 30 mL  30 mL Oral Q4H PRN Mills, Shnese E, NP       amiodarone  (PACERONE ) tablet 200 mg  200 mg Oral Daily Mills, Shnese E, NP   200 mg at 05/31/23 0758   haloperidol  (HALDOL ) tablet 5 mg  5 mg Oral TID PRN Doneen Fuelling, NP       And   diphenhydrAMINE  (BENADRYL ) capsule 50 mg  50 mg Oral TID PRN Mills, Shnese E, NP       haloperidol  lactate (HALDOL ) injection 5 mg  5 mg Intramuscular TID PRN Doneen Fuelling, NP       And   diphenhydrAMINE  (BENADRYL ) injection 50 mg  50 mg Intramuscular TID PRN Doneen Fuelling, NP       And   LORazepam  (ATIVAN ) injection 2 mg  2 mg Intramuscular TID PRN Mills, Shnese E, NP       haloperidol  lactate (HALDOL ) injection 10 mg  10 mg Intramuscular TID PRN Doneen Fuelling, NP       And   diphenhydrAMINE  (BENADRYL ) injection 50 mg  50 mg Intramuscular TID PRN Doneen Fuelling, NP       And   LORazepam  (ATIVAN ) injection 2 mg  2 mg Intramuscular TID PRN Mills, Shnese E, NP       divalproex  (DEPAKOTE ) DR tablet 750 mg  750 mg Oral BID Mills, Shnese E, NP   750 mg at 05/31/23 0757   doxepin  (SINEQUAN ) capsule 50 mg  50 mg Oral QHS Mills, Shnese E, NP   50 mg at 05/30/23 2051   escitalopram (LEXAPRO) tablet 5 mg  5 mg Oral QHS [name], [name], NP       feeding supplement (ENSURE ENLIVE / ENSURE PLUS) liquid 237 mL  237 mL Oral TID BM Mills, Shnese E, NP   237 mL at 05/30/23 2051   levOCARNitine  (CARNITOR ) 1 GM/10ML  solution 500 mg  500 mg Oral Daily Mills, Shnese E, NP   500 mg at 05/31/23 0757   lurasidone  (LATUDA ) tablet 20 mg  20 mg Oral Q supper Mills, Shnese E, NP   20 mg at 05/30/23 2050   magnesium  hydroxide (MILK OF MAGNESIA) suspension 30 mL  30 mL Oral Daily PRN Mills, Shnese E, NP       melatonin tablet 5 mg  5 mg Oral QHS Mills, Shnese E, NP   5 mg at 05/30/23 2048   metoprolol  succinate (TOPROL -XL) 24 hr tablet 25 mg  25 mg Oral Daily Mills, Shnese E, NP   25 mg at 05/31/23 0758   multivitamin with minerals tablet 1 tablet  1 tablet Oral Daily [name], NP   1 tablet at 05/31/23 0758   tamsulosin  (FLOMAX ) capsule 0.4 mg  0.4 mg Oral QPC supper Mills, Shnese E, NP   0.4 mg at 05/30/23 2048   traMADol  (ULTRAM ) tablet 50 mg  50 mg Oral Q6H PRN Mills, Shnese E, NP   50 mg at 05/30/23 1706  PTA Medications: Medications Prior to Admission  Medication Sig Dispense Refill Last Dose/Taking   amiodarone  (PACERONE ) 200 MG tablet Take 1 tablet (200 mg total) by mouth 2 (two) times daily for 7 days, THEN 1 tablet (200 mg total) daily. 44 tablet 0    aspirin  EC 81 MG tablet Take 1 tablet (81 mg total) by mouth daily. Swallow whole.      divalproex  (DEPAKOTE ) 250 MG DR tablet Take 3 tablets (750 mg total) by mouth 2 (two) times daily. 180 tablet 0    doxepin  (SINEQUAN ) 50 MG capsule Take 1 capsule (50 mg total) by mouth at bedtime. 30 capsule 0    feeding supplement (ENSURE ENLIVE / ENSURE PLUS) LIQD Take 237 mLs by mouth 3 (three) times daily between meals. 237 mL 12    levOCARNitine  (CARNITOR ) 1 GM/10ML solution Take 5 mLs (500 mg total) by mouth daily.      lurasidone  (LATUDA ) 20 MG TABS tablet Take 4 tablets (80 mg total) by mouth daily with supper. (Patient taking differently: Take 20 mg by mouth daily with supper.) 120 tablet 0    melatonin 5 MG TABS Take 1 tablet (5 mg total) by mouth at bedtime. 30 tablet 0    metoprolol  succinate (TOPROL -XL) 25 MG 24 hr tablet Take 1 tablet (25 mg total) by  mouth daily. 30 tablet 2    Multiple Vitamin (MULTIVITAMIN WITH MINERALS) TABS tablet Take 1 tablet by mouth daily. 30 tablet 0    tamsulosin  (FLOMAX ) 0.4 MG CAPS capsule Take 1 capsule (0.4 mg total) by mouth daily after supper.      traMADol  (ULTRAM ) 50 MG tablet Take 1 tablet (50 mg total) by mouth every 6 (six) hours as needed for severe pain (pain score 7-10). 30 tablet 0    venlafaxine  XR (EFFEXOR -XR) 150 MG 24 hr capsule Take 1 capsule (150 mg total) by mouth daily after breakfast. 30 capsule 0      Patient Stressors: Health problems   Medication change or noncompliance   Traumatic event     Patient Strengths: Average or above average intelligence  Capable of independent living  Communication skills  Motivation for treatment/growth  Work skills    Treatment Modalities: Medication Management, Group therapy, Case management,  1 to 1 session with clinician, Psychoeducation, Recreational therapy.     Physician Treatment Plan for Primary Diagnosis: Bipolar 1 disorder, depressed (HCC) Long Term Goal(s): Improvement in symptoms so as ready for discharge    Short Term Goals: Ability to identify and develop effective coping behaviors will improve Ability to maintain clinical measurements within normal limits will improve Compliance with prescribed medications will improve Ability to identify triggers associated with substance abuse/mental health issues will improve Ability to identify changes in lifestyle to reduce recurrence of condition will improve Ability to verbalize feelings will improve Ability to disclose and discuss suicidal ideas Ability to demonstrate self-control will improve   Medication Management: Evaluate patient's response, side effects, and tolerance of medication regimen.   Therapeutic Interventions: 1 to 1 sessions, Unit Group sessions and Medication administration.   Evaluation of Outcomes: Not Progressing   Physician Treatment Plan for Secondary Diagnosis:  Principal Problem:   Bipolar 1 disorder, depressed (HCC)   Long Term Goal(s): Improvement in symptoms so as ready for discharge    Short Term Goals: Ability to identify and develop effective coping behaviors will improve Ability to maintain clinical measurements within normal limits will improve Compliance with prescribed medications will improve Ability to identify triggers  associated with substance abuse/mental health issues will improve Ability to identify changes in lifestyle to reduce recurrence of condition will improve Ability to verbalize feelings will improve Ability to disclose and discuss suicidal ideas Ability to demonstrate self-control will improve      Medication Management: Evaluate patient's response, side effects, and tolerance of medication regimen.   Therapeutic Interventions: 1 to 1 sessions, Unit Group sessions and Medication administration.   Evaluation of Outcomes: Not Progressing     RN Treatment Plan for Primary Diagnosis: Bipolar 1 disorder, depressed (HCC) Long Term Goal(s): Knowledge of disease and therapeutic regimen to maintain health will improve   Short Term Goals: Ability to remain free from injury will improve, Ability to verbalize frustration and anger appropriately will improve, Ability to demonstrate self-control, Ability to participate in decision making will improve, Ability to verbalize feelings will improve, Ability to disclose and discuss suicidal ideas, Ability to identify and develop effective coping behaviors will improve, and Compliance with prescribed medications will improve   Medication Management: RN will administer medications as ordered by provider, will assess and evaluate patient's response and provide education to patient for prescribed medication. RN will report any adverse and/or side effects to prescribing provider.   Therapeutic Interventions: 1 on 1 counseling sessions, Psychoeducation, Medication administration, Evaluate  responses to treatment, Monitor vital signs and CBGs as ordered, Perform/monitor CIWA, COWS, AIMS and Fall Risk screenings as ordered, Perform wound care treatments as ordered.   Evaluation of Outcomes: Not Progressing     LCSW Treatment Plan for Primary Diagnosis: Bipolar 1 disorder, depressed (HCC) Long Term Goal(s): Safe transition to appropriate next level of care at discharge, Engage patient in therapeutic group addressing interpersonal concerns.   Short Term Goals: Engage patient in aftercare planning with referrals and resources, Increase social support, Increase ability to appropriately verbalize feelings, Increase emotional regulation, Facilitate acceptance of mental health diagnosis and concerns, Facilitate patient progression through stages of change regarding substance use diagnoses and concerns, Identify triggers associated with mental health/substance abuse issues, and Increase skills for wellness and recovery   Therapeutic Interventions: Assess for all discharge needs, 1 to 1 time with Social worker, Explore available resources and support systems, Assess for adequacy in community support network, Educate family and significant other(s) on suicide prevention, Complete Psychosocial Assessment, Interpersonal group therapy.   Evaluation of Outcomes: Not Progressing     Progress in Treatment: Attending groups: Yes. Participating in groups: Yes. Taking medication as prescribed: Yes. Toleration medication: Yes. Family/Significant other contact made: No, will contact:  consents pending Patient understands diagnosis: Yes. Discussing patient identified problems/goals with staff: Yes. Medical problems stabilized or resolved: Yes. Denies suicidal/homicidal ideation: Yes. Endorses passive SI. Issues/concerns per patient self-inventory: No.   New problem(s) identified: No, Describe:  none   New Short Term/Long Term Goal(s): medication stabilization, elimination of SI thoughts,  development of comprehensive mental wellness plan.      Patient Goals:  "Not have suicidal thoughts, it's been going on for months. Lower depression and anxiety"   Discharge Plan or Barriers: Patient recently admitted. CSW will continue to follow and assess for appropriate referrals and possible discharge planning.      Reason for Continuation of Hospitalization: Anxiety Depression Medication stabilization Suicidal ideation   Estimated Length of Stay: 5-7 days  Last 3 [place] Suicide Severity Risk Score: Flowsheet Row Ad[facility] (Current) [facility]29/2023 in [facility] ED to Hosp-Admission (Discharged) from 04/12/2023  in [facility]  C-SSRS RISK CATEGORY Moderate Risk Moderate Risk High Risk       Last PHQ 2/9 Scores:     No data to display          Scribe for Treatment Team: Vonzell Guerin, LCSWA 05/31/2023 2:12 PM

## 2023-05-31 NOTE — Plan of Care (Signed)
   Problem: Education: Goal: Emotional status will improve Outcome: Progressing   Problem: Activity: Goal: Interest or engagement in activities will improve Outcome: Progressing

## 2023-06-01 DIAGNOSIS — F319 Bipolar disorder, unspecified: Secondary | ICD-10-CM | POA: Diagnosis not present

## 2023-06-01 LAB — LIPID PANEL
Cholesterol: 246 mg/dL — ABNORMAL HIGH (ref 0–200)
HDL: 37 mg/dL — ABNORMAL LOW (ref >40)
LDL Cholesterol: 183 mg/dL — ABNORMAL HIGH (ref 0–99)
Total CHOL/HDL Ratio: 6.6 ratio
Triglycerides: 129 mg/dL (ref <150)
VLDL: 26 mg/dL (ref 0–40)

## 2023-06-01 LAB — HEMOGLOBIN A1C
Hgb A1c MFr Bld: 4.9 % (ref 4.8–5.6)
Mean Plasma Glucose: 93.93 mg/dL

## 2023-06-01 MED ORDER — ATORVASTATIN CALCIUM 10 MG PO TABS
20.0000 mg | ORAL_TABLET | Freq: Every day | ORAL | Status: DC
  Administered 2023-06-01 – 2023-06-07 (×7): 20 mg via ORAL
  Filled 2023-06-01 (×7): qty 2

## 2023-06-01 MED ORDER — ESCITALOPRAM OXALATE 10 MG PO TABS
10.0000 mg | ORAL_TABLET | Freq: Every day | ORAL | Status: DC
  Administered 2023-06-01 – 2023-06-04 (×4): 10 mg via ORAL
  Filled 2023-06-01 (×4): qty 1

## 2023-06-01 NOTE — Group Note (Signed)
 LCSW Group Therapy Note   Group Date: 06/01/2023 Start Time: 1100 End Time: 1200   Participation:  did not attend  Type of Therapy:  Group Therapy  Topic:  Shining from Within:  Confidence and Self-love Journey  Objective:  To support participants in developing confidence and self-love through self-awareness, self-compassion, and practical skills that nurture personal growth.   Group Goals Encourage self-reflection and self-acceptance by identifying personal strengths and achievements. Teach skills to challenge negative self-talk and replace it with supportive, truthful self-talk. Foster resilience and self-worth through [employer], gratitude, and self-care practices.   Summary:  This group explores the connection between confidence and self-love by guiding participants through reflection, mindset shifts, and practical tools like affirmations, strength recognition, and goal-setting. Activities are designed to promote self-compassion, build emotional resilience, and normalize the slow, patient journey of inner growth.   Therapeutic Modalities Used: Elements of Cognitive Behavioral Therapy (CBT): Challenging and reframing unhelpful self-talk. Elements of Motivational Interviewing (MI): Encouraging small, achievable goals. Elements of Dialectical Behavioral Therapist (DBT):  Mindfulness and Self-Compassion: Promoting present-moment awareness and kindness toward self.   Anaaya Fuster O Myles Tavella, LCSWA 06/01/2023  12:08 PM

## 2023-06-01 NOTE — Plan of Care (Signed)
 Problem: Education: Goal: Knowledge of [facility] Education information/materials will improve Outcome: Progressing Goal: Emotional status will improve Outcome: Progressing Goal: Mental status will improve Outcome: Progressing Goal: Verbalization of understanding the information provided will improve Outcome: Progressing   Problem: Activity: Goal: Interest or engagement in activities will improve Outcome: Progressing Goal: Sleeping patterns will improve Outcome: Progressing   Problem: Coping: Goal: Ability to verbalize frustrations and anger appropriately will improve Outcome: Progressing Goal: Ability to demonstrate self-control will improve Outcome: Progressing   Problem: Health Behavior/Discharge Planning: Goal: Identification of resources available to assist in meeting health care needs will improve Outcome: Progressing Goal: Compliance with treatment plan for underlying cause of condition will improve Outcome: Progressing   Problem: Physical Regulation: Goal: Ability to maintain clinical measurements within normal limits will improve Outcome: Progressing   Problem: Safety: Goal: Periods of time without injury will increase Outcome: Progressing   Problem: Education: Goal: Knowledge of [facility] Education information/materials will improve Outcome: Progressing Goal: Emotional status will improve Outcome: Progressing Goal: Mental status will improve Outcome: Progressing Goal: Verbalization of understanding the information provided will improve Outcome: Progressing   Problem: Activity: Goal: Interest or engagement in activities will improve Outcome: Progressing Goal: Sleeping patterns will improve Outcome: Progressing   Problem: Coping: Goal: Ability to verbalize frustrations and anger appropriately will improve Outcome: Progressing Goal: Ability to demonstrate self-control will improve Outcome: Progressing   Problem: Health  Behavior/Discharge Planning: Goal: Identification of resources available to assist in meeting health care needs will improve Outcome: Progressing Goal: Compliance with treatment plan for underlying cause of condition will improve Outcome: Progressing   Problem: Physical Regulation: Goal: Ability to maintain clinical measurements within normal limits will improve Outcome: Progressing   Problem: Safety: Goal: Periods of time without injury will increase Outcome: Progressing   Problem: Education: Goal: Ability to make informed decisions regarding treatment will improve Outcome: Progressing   Problem: Coping: Goal: Coping ability will improve Outcome: Progressing   Problem: Health Behavior/Discharge Planning: Goal: Identification of resources available to assist in meeting health care needs will improve Outcome: Progressing   Problem: Medication: Goal: Compliance with prescribed medication regimen will improve Outcome: Progressing   Problem: Self-Concept: Goal: Ability to disclose and discuss suicidal ideas will improve Outcome: Progressing Goal: Will verbalize positive feelings about self Outcome: Progressing Note: Patient is on track. Patient will maintain adherence and work on increased adherence    Problem: Education: Goal: Utilization of techniques to improve thought processes will improve Outcome: Progressing Goal: Knowledge of the prescribed therapeutic regimen will improve Outcome: Progressing   Problem: Activity: Goal: Interest or engagement in leisure activities will improve Outcome: Progressing Goal: Imbalance in normal sleep/wake cycle will improve Outcome: Progressing   Problem: Coping: Goal: Coping ability will improve Outcome: Progressing Goal: Will verbalize feelings Outcome: Progressing   Problem: Health Behavior/Discharge Planning: Goal: Ability to make decisions will improve Outcome: Progressing Goal: Compliance with therapeutic regimen will  improve Outcome: Progressing   Problem: Role Relationship: Goal: Will demonstrate positive changes in social behaviors and relationships Outcome: Progressing   Problem: Safety: Goal: Ability to disclose and discuss suicidal ideas will improve Outcome: Progressing Goal: Ability to identify and utilize support systems that promote safety will improve Outcome: Progressing   Problem: Self-Concept: Goal: Will verbalize positive feelings about self Outcome: Progressing Goal: Level of anxiety will decrease Outcome: Progressing   Problem: Education: Goal: Ability to state activities that reduce stress will improve Outcome: Progressing   Problem: Coping: Goal: Ability to identify and  develop effective coping behavior will improve Outcome: Progressing   Problem: Self-Concept: Goal: Ability to identify factors that promote anxiety will improve Outcome: Progressing Goal: Level of anxiety will decrease Outcome: Progressing Goal: Ability to modify response to factors that promote anxiety will improve Outcome: Progressing

## 2023-06-01 NOTE — Progress Notes (Signed)
 [facility] MD Progress Note  06/01/2023 12:30 PM [name]  MRN:  [id number]  Reason for admission: 63 year old Caucasian male with hx of substance use disorders & mental health issues. Admitted to the [facility] from the [facility] with complaint of worsening suicidal thoughts with plans to hang himself. Prior to this admission, patient was residing at the [facility] in [place], [place]. Patient was admitted & treated at the [facility] in February of this year, 2025 from 02-15-23 through 03-07-23 with an outpatient recommendation for routine psychiatric care/medication management at the [facility] psychiatric clinic. Patient reports today that he has been feeling/depressed x 1 week after running out of medications. He says he was residing at the [facility] in [place] . He says his boss took him to [facility] for evaluation of suicidal ideations & worsening suicidal ideations. Current lab results reviewed   Daily notes: [name] is seen in his room, chart reviewed. The chart finding discussed with the treatment team. He presents alert, oriented & aware of situation. He is lying down in bed. His affect is flat, making a fair eye contact. He reports, "My mood is so-so today.  I am still feeling very sad.  I need the right medication for my mood. I slept off  & on last night but it was not a restful sleep. As much as I can remember, very seldom have I been happy in my life. I need someone to love & to love me back. The woman I loved for 34 years, got put in a nursing home 4 years ago.  Since then, I stayed depressed.  I think about hurting myself all the time.  All my family members has passed away.  I do not have any support system.  I have been attending group since being here, but the group sessions has not been able to uplift my mood.  The doxepin  I take at night for sleep does not really help much.  I been tried on a lot of medicines for my mood, for anxiety & sleep.  Not has been able to help me. My  appetite is not too good". [name] continues to endorse passive suicidal ideations. He currently denies any HI, AVH, delusional thoughts or paranoia. He does not appear to be responding to any internal stimuli. Discussed this case with the attending psychiatrist. Patient's Lexapro is increased from 5 mg to 10 mg daily. There are no other changes made. Reviewed current lab results; chol. 246 ^, HDL. 37, LDL. 183. Will initiate Lipitor 20 mg po Q evenings for hyperlipidemia.  Principal Problem: Bipolar 1 disorder, depressed (HCC)  Diagnosis: Principal Problem:   Bipolar 1 disorder, depressed (HCC)  Total Time spent with patient: 45 minutes  Past Psychiatric History: See H&P.  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 echo 5/17: EF 60-65%, no RWMA, LV dias fxn nl, LA mildly dilated, RV sys fxn nl, PASP nl, moderate sized circumferential pericardial effusion was identified, 2.12 cm around the LV free wall, <1 cm  around the RV free wall. Features were not c/w tamponade physiology   PTSD (post-traumatic stress disorder)    Witnessed brother's suicide.   Restless leg syndrome    Seizures (HCC)    Syncope     Past Surgical History:  Procedure Laterality Date   APPENDECTOMY     CARDIOVERSION N/A 04/17/2023   Procedure: CARDIOVERSION;  Surgeon: Constancia Delton, MD;  Location: [facility] ORS;  Service: Cardiovascular;  Laterality: N/A;   CYSTOSCOPY WITH URETEROSCOPY AND STENT PLACEMENT     ESOPHAGOGASTRODUODENOSCOPY N/A 01/11/2016   Procedure: ESOPHAGOGASTRODUODENOSCOPY (EGD);  Surgeon: Baldo Bonds, MD;  Location: [facility] ENDOSCOPY;  Service:  Endoscopy;  Laterality: N/A;   ESOPHAGOGASTRODUODENOSCOPY N/A 04/09/2020   Procedure: ESOPHAGOGASTRODUODENOSCOPY (EGD);  Surgeon: Luke Salaam, MD;  Location: [facility] ENDOSCOPY;  Service: Gastroenterology;  Laterality: N/A;   INCISION AND DRAINAGE ABSCESS Left 01/02/2018   Procedure: INCISION AND DRAINAGE LEFT KNEE;  Surgeon: Marlynn Singer, MD;  Location: [facility] ORS;  Service: Orthopedics;  Laterality: Left;   JOINT REPLACEMENT Right    TKR   KNEE ARTHROSCOPY Right 06/25/2014   Procedure: ARTHROSCOPY KNEE;  Surgeon: Marlynn Singer, MD;  Location: [facility] ORS;  Service: Orthopedics;  Laterality: Right;  partial arthroscopic medial menisectomy   LAPAROSCOPIC APPENDECTOMY N/A 06/02/2021   Procedure: APPENDECTOMY LAPAROSCOPIC;  Surgeon: Flynn Hylan, MD;  Location: [facility] ORS;  Service: General;  Laterality: N/A;   TEE WITHOUT CARDIOVERSION N/A 04/17/2023   Procedure: ECHOCARDIOGRAM, TRANSESOPHAGEAL;  Surgeon: Constancia Delton, MD;  Location: [facility] ORS;  Service: Cardiovascular;  Laterality: N/A;   TOTAL KNEE ARTHROPLASTY Right 04/22/2015   Procedure: TOTAL KNEE ARTHROPLASTY;  Surgeon: Marlynn Singer, MD;  Location: [facility] ORS;  Service: Orthopedics;  Laterality: Right;   TOTAL KNEE ARTHROPLASTY Left 10/30/2017   Procedure: TOTAL KNEE ARTHROPLASTY;  Surgeon: Marlynn Singer, MD;  Location: [facility] ORS;  Service: Orthopedics;  Laterality: Left;   TOTAL KNEE REVISION Left 01/02/2018   Procedure: poly exchange of tibia and patella left knee;  Surgeon: Marlynn Singer, MD;  Location: [facility] ORS;  Service: Orthopedics;  Laterality: Left;   UMBILICAL HERNIA REPAIR  06/02/2021   Procedure: HERNIA REPAIR UMBILICAL ADULT;  Surgeon: Flynn Hylan, MD;  Location: [facility] ORS;  Service: General;;   Family History:  Family History  Problem Relation Age of Onset   CVA Mother        deceased at age 66   Depression Brother        Died by suicide at age 16   Family Psychiatric  History: See H&P.  Social History:  Social  History   Substance and Sexual Activity  Alcohol Use Not Currently   Comment: rare     Social History   Substance and Sexual Activity  Drug Use Not Currently   Types: Marijuana, Cocaine   Comment: last use January 2025, cocaine    Social History   Socioeconomic History   Marital status: Single    Spouse name: Not on file   Number of children: Not on file   Years of education: Not on file   Highest education level: Not on file  Occupational History   Not on file  Tobacco Use   Smoking status: Former    Current packs/day: 0.00    Average packs/day: 0.8 packs/day for 20.0 years (15.0 ttl pk-yrs)    Types: Cigarettes    Start date: 05/16/1964    Quit date: 05/16/1984    Years since quitting: 39.0   Smokeless tobacco: Never  Vaping Use   Vaping status: Never  Used  Substance and Sexual Activity   Alcohol use: Not Currently    Comment: rare   Drug use: Not Currently    Types: Marijuana, Cocaine    Comment: last use January 2025, cocaine   Sexual activity: Not Currently  Other Topics Concern   Not on file  Social History Narrative   ** Merged History Encounter **       Social Drivers of Health   Financial Resource Strain: Not on file  Food Insecurity: No Food Insecurity (05/30/2023)   Hunger Vital Sign    Worried About Running Out of Food in the Last Year: Never true    Ran Out of Food in the Last Year: Never true  Transportation Needs: No Transportation Needs (05/30/2023)   PRAPARE - Administrator, Civil Service (Medical): No    Lack of Transportation (Non-Medical): No  Physical Activity: Not on file  Stress: Not on file  Social Connections: Socially Isolated (02/15/2023)   Social Connection and Isolation Panel [NHANES]    Frequency of Communication with Friends and Family: Once a week    Frequency of Social Gatherings with Friends and Family: Once a week    Attends Religious Services: Never    Database administrator or Organizations: No    Attends Probation officer: Never    Marital Status: Never married   Additional Social History:   Sleep: Good  Appetite:  Good  Current Medications: Current Facility-Administered Medications  Medication Dose Route Frequency Provider Last Rate Last Admin   acetaminophen  (TYLENOL ) tablet 650 mg  650 mg Oral Q6H PRN Mills, Shnese E, NP       alum & mag hydroxide-simeth (MAALOX/MYLANTA) 200-200-20 MG/5ML suspension 30 mL  30 mL Oral Q4H PRN Mills, Shnese E, NP       amiodarone  (PACERONE ) tablet 200 mg  200 mg Oral Daily Mills, Shnese E, NP   200 mg at 06/01/23 0746   haloperidol  (HALDOL ) tablet 5 mg  5 mg Oral TID PRN Doneen Fuelling, NP       And   diphenhydrAMINE  (BENADRYL ) capsule 50 mg  50 mg Oral TID PRN Mills, Shnese E, NP       haloperidol  lactate (HALDOL ) injection 5 mg  5 mg Intramuscular TID PRN Doneen Fuelling, NP       And   diphenhydrAMINE  (BENADRYL ) injection 50 mg  50 mg Intramuscular TID PRN Doneen Fuelling, NP       And   LORazepam  (ATIVAN ) injection 2 mg  2 mg Intramuscular TID PRN Mills, Shnese E, NP       haloperidol  lactate (HALDOL ) injection 10 mg  10 mg Intramuscular TID PRN Doneen Fuelling, NP       And   diphenhydrAMINE  (BENADRYL ) injection 50 mg  50 mg Intramuscular TID PRN Doneen Fuelling, NP       And   LORazepam  (ATIVAN ) injection 2 mg  2 mg Intramuscular TID PRN Mills, Shnese E, NP       divalproex  (DEPAKOTE ) DR tablet 750 mg  750 mg Oral BID Mills, Shnese E, NP   750 mg at 06/01/23 0745   doxepin  (SINEQUAN ) capsule 50 mg  50 mg Oral QHS Mills, Shnese E, NP   50 mg at 05/31/23 2116   escitalopram (LEXAPRO) tablet 5 mg  5 mg Oral QHS Domitila Stetler I, NP   5 mg at 05/31/23 2116   feeding supplement (ENSURE ENLIVE / ENSURE PLUS) liquid  237 mL  237 mL Oral TID BM Mills, Shnese E, NP   237 mL at 05/31/23 2100   levOCARNitine  (CARNITOR ) 1 GM/10ML solution 500 mg  500 mg Oral Daily Mills, Shnese E, NP   500 mg at 05/31/23 0757   lurasidone  (LATUDA ) tablet 20 mg  20 mg  Oral Q supper Mills, Shnese E, NP   20 mg at 05/31/23 1703   magnesium  hydroxide (MILK OF MAGNESIA) suspension 30 mL  30 mL Oral Daily PRN Mills, Shnese E, NP       melatonin tablet 5 mg  5 mg Oral QHS Mills, Shnese E, NP   5 mg at 05/30/23 2048   metoprolol  succinate (TOPROL -XL) 24 hr tablet 25 mg  25 mg Oral Daily Mills, Shnese E, NP   25 mg at 06/01/23 0746   multivitamin with minerals tablet 1 tablet  1 tablet Oral Daily Mills, Shnese E, NP   1 tablet at 06/01/23 2956   tamsulosin  (FLOMAX ) capsule 0.4 mg  0.4 mg Oral QPC supper [name], NP   0.4 mg at 05/31/23 1703   traMADol  (ULTRAM ) tablet 50 mg  50 mg Oral Q6H PRN Mills, Shnese E, NP   50 mg at 06/01/23 2130    Lab Results:  Results for orders placed or performed during the hospital encounter of 05/30/23 (from the past 48 hours)  Hemoglobin A1c     Status: None   Collection Time: 06/01/23  6:19 AM  Result Value Ref Range   Hgb A1c MFr Bld 4.9 4.8 - 5.6 %    Comment: (NOTE) Pre diabetes:          5.7%-6.4%  Diabetes:              >6.4%  Glycemic control for   <7.0% adults with diabetes    Mean Plasma Glucose 93.93 mg/dL    Comment: Performed at [facility], [address], [place], [place] [address]  Lipid panel     Status: Abnormal   Collection Time: 06/01/23  6:19 AM  Result Value Ref Range   Cholesterol 246 (H) 0 - 200 mg/dL   Triglycerides 469 <629 mg/dL   HDL 37 (L) >52 mg/dL   Total CHOL/HDL Ratio 6.6 RATIO   VLDL 26 0 - 40 mg/dL   LDL Cholesterol 841 (H) 0 - 99 mg/dL    Comment:        Total Cholesterol/HDL:CHD Risk Coronary Heart Disease Risk Table                     Men   Women  1/2 Average Risk   3.4   3.3  Average Risk       5.0   4.4  2 X Average Risk   9.6   7.1  3 X Average Risk  23.4   11.0        Use the calculated Patient Ratio above and the CHD Risk Table to determine the patient's CHD Risk.        ATP III CLASSIFICATION (LDL):  <100     mg/dL   Optimal  324-401  mg/dL   Near or  Above                    Optimal  130-159  mg/dL   Borderline  027-253  mg/dL   High  >664     mg/dL   Very High Performed at [facility], [address] [place], [place] [address]     Blood Alcohol level:  Lab Results  Component Value Date   [facility] <15 05/29/2023   ETH <10 04/12/2023    Metabolic Disorder Labs: Lab Results  Component Value Date   HGBA1C 4.9 06/01/2023   MPG 93.93 06/01/2023   MPG 111.15 12/29/2022   No results found for: "PROLACTIN" Lab Results  Component Value Date   CHOL 246 (H) 06/01/2023   TRIG 129 06/01/2023   HDL 37 (L) 06/01/2023   CHOLHDL 6.6 06/01/2023   VLDL 26 06/01/2023   LDLCALC 183 (H) 06/01/2023   LDLCALC 146 (H) 12/29/2022    Physical Findings: AIMS:  , ,  ,  ,    CIWA:    COWS:     Musculoskeletal: Strength & Muscle Tone: within normal limits Gait & Station: normal Patient leans: N/A  Psychiatric Specialty Exam:  Presentation  General Appearance:  Casual; Fairly Groomed  Eye Contact: Good  Speech: Clear and Coherent; Normal Rate  Speech Volume: Normal  Handedness: Right   Mood and Affect  Mood: Anxious; Depressed  Affect: Congruent   Thought Process  Thought Processes: Coherent; Goal Directed  Descriptions of Associations:Intact  Orientation:Full (Time, Place and Person)  Thought Content:Logical  History of Schizophrenia/Schizoaffective disorder:No  Duration of Psychotic Symptoms:N/A  Hallucinations:Hallucinations: -- ("None at this time".)  Ideas of Reference:None  Suicidal Thoughts:Suicidal Thoughts: Yes, Passive SI Passive Intent and/or Plan: Without Intent; Without Plan; Without Means to Carry Out; Without Access to Means  Homicidal Thoughts:Homicidal Thoughts: No   Sensorium  Memory: Immediate Good; Recent Good; Remote Good  Judgment: Fair  Insight: Fair   Art therapist  Concentration: Good  Attention Span: Good  Recall: Good  Fund of  Knowledge: Fair  Language: Fair   Psychomotor Activity  Psychomotor Activity: Psychomotor Activity: Normal   Assets  Assets: Communication Skills; Desire for Improvement; Resilience; Housing; Social Support  Sleep  Sleep: Sleep: Poor Number of Hours of Sleep: 5  Physical Exam: Physical Exam Vitals and nursing note reviewed.  HENT:     Head: Normocephalic.     Nose: Nose normal.     Mouth/Throat:     Pharynx: Oropharynx is clear.  Cardiovascular:     Rate and Rhythm: Normal rate.     Pulses: Normal pulses.  Pulmonary:     Effort: Pulmonary effort is normal.  Genitourinary:    Comments: Deferred Musculoskeletal:        General: Normal range of motion.     Cervical back: Normal range of motion.  Skin:    General: Skin is dry.  Neurological:     General: No focal deficit present.     Mental Status: He is alert and oriented to person, place, and time.   Review of Systems  Constitutional:  Negative for chills, diaphoresis and fever.  HENT:  Negative for congestion and sore throat.   Eyes:  Negative for blurred vision.  Respiratory:  Negative for cough, shortness of breath and wheezing.   Cardiovascular:  Negative for chest pain and palpitations.  Gastrointestinal:  Negative for abdominal pain, constipation, diarrhea, heartburn, nausea and vomiting.  Genitourinary:  Negative for dysuria.  Musculoskeletal:  Negative for joint pain and myalgias.  Neurological:  Negative for dizziness, tingling, tremors, sensory change, speech change, focal weakness, seizures, loss of consciousness, weakness and headaches.  Endo/Heme/Allergies:        Allergies: Ketorolac .  Psychiatric/Behavioral:  Positive for depression and suicidal ideas. Negative for hallucinations, memory loss  and substance abuse (Hx. substance use.). The patient has insomnia (I slept off & on.). The patient is not nervous/anxious.    Blood pressure 103/72, pulse 75, temperature (!) 97.4 F (36.3 C), temperature  source Oral, resp. rate 14, height 5\' 9"  (1.753 m), weight 77 kg, SpO2 96%. Body mass index is 25.07 kg/m.  Treatment Plan Summary: Daily contact with patient to assess and evaluate symptoms and progress in treatment and Medication management.   Plan: The risks/benefits/side-effects/alternatives to the medications in use were discussed in detail with the patient and time was given for patient's questions. The patient consents to medication trial.    -Continue Depakote  750 mg po bid for mood stability/seizures.  -Continue Doze[in 50 mg po Q hs for insomnia. -Increased Lexapro to 10 mg po daily for depression. -Continue Melatonin 5 mg po at bedtime for insomnia.  -Continue Latuda  20 mg po Q hs with supper for mood control.    Other medical issues.  -Continue Flomax  .04 po daily for Prostate health.  -Continue Metoprolol  25 mg po bid for HTN.  -Continue 500 mg po for low blood levels.  -amiodarone  750 mg po daily for irregular heart beat. -Initiated Lipitor 20 mg po daily for hyperlipidemia.   Other PRNS -Continue Tylenol  650 mg every 6 hours PRN for mild pain -Continue Maalox 30 ml Q 4 hrs PRN for indigestion -Continue MOM 30 ml po Q 6 hrs for constipation   Safety and Monitoring: Voluntary admission to inpatient psychiatric unit for safety, stabilization and treatment Daily contact with patient to assess and evaluate symptoms and progress in treatment Patient's case to be discussed in multi-disciplinary team meeting Observation Level : q15 minute checks Vital signs: q12 hours Precautions: Safety   Discharge Planning: Social work and case management to assist with discharge planning and identification of hospital follow-up needs prior to discharge Estimated LOS: 5-7 days Discharge Concerns: Need to establish a safety plan; Medication compliance and effectiveness Discharge Goals: Return home with outpatient referrals for mental health follow-up including medication  management/psychotherapy Asuncion Layer, NP 06/01/2023, 12:30 PM

## 2023-06-01 NOTE — [facility] Group Notes (Signed)
 [facility] Group Notes:  (Nursing/MHT/Case Management/Adjunct)  Date:  06/01/2023  Time:  2000  Type of Therapy:  Wrap up group  Participation Level:  Active  Participation Quality:  Appropriate, Attentive, Sharing, and Supportive  Affect:  Depressed  Cognitive:  Alert  Insight:  Improving  Engagement in Group:  Engaged  Modes of Intervention:  Clarification, Education, and Socialization  Summary of Progress/Problems: Positive thinking and self-care were discussed.   [name] 06/01/2023, 8:40 PM

## 2023-06-01 NOTE — Plan of Care (Signed)
  Problem: Education: Goal: Emotional status will improve Outcome: Not Progressing   Problem: Activity: Goal: Interest or engagement in activities will improve Outcome: Not Progressing

## 2023-06-01 NOTE — Progress Notes (Signed)
   06/01/23 0900  Psych Admission Type (Psych Patients Only)  Admission Status Voluntary  Psychosocial Assessment  Patient Complaints Anxiety;Depression  Eye Contact Fair  Facial Expression Flat  Affect Appropriate to circumstance  Speech Logical/coherent  Interaction Evasive  Motor Activity Slow  Appearance/Hygiene Disheveled  Behavior Characteristics Cooperative;Calm  Mood Depressed  Thought Process  Coherency WDL  Content WDL  Delusions None reported or observed  Perception WDL  Hallucination None reported or observed  Judgment WDL  Confusion None  Danger to Self  Current suicidal ideation? Passive  Description of Suicide Plan No Plan  Self-Injurious Behavior No self-injurious ideation or behavior indicators observed or expressed   Agreement Not to Harm Self Yes  Description of Agreement Verbal Contract  Danger to Others  Danger to Others None reported or observed

## 2023-06-02 DIAGNOSIS — F319 Bipolar disorder, unspecified: Secondary | ICD-10-CM | POA: Diagnosis not present

## 2023-06-02 NOTE — Progress Notes (Signed)
 [facility] MD Progress Note  06/02/2023 10:14 AM [name]  MRN:  [id number]  Reason for admission: 63 year old Caucasian male with hx of substance use disorders & mental health issues. Admitted to the [facility] from the [facility] with complaint of worsening suicidal thoughts with plans to hang himself. Prior to this admission, patient was residing at the [facility] in [place], [place]. Patient was admitted & treated at the [facility] in February of this year, 2025 from 02-15-23 through 03-07-23 with an outpatient recommendation for routine psychiatric care/medication management at the [facility] psychiatric clinic. Patient reports today that he has been feeling/depressed x 1 week after running out of medications. He says he was residing at the [facility] in [place] . He says his boss took him to [facility] for evaluation of suicidal ideations & worsening suicidal ideations. Current lab results reviewed   Daily notes: [name] is seen in his room, chart reviewed. The chart finding discussed with the treatment team. He presents alert, oriented & aware of situation. He is lying down in bed again today. His affect is flat, making a fair eye contact. He reports, "I'm alright, but still feeling depressed. I did not go to breakfast this morning because I was not hungry. I feel dead inside. I do not think that I'm benefiting from the medicines I have been taking. I'm still feeling suicidal because I think about dying all the time. I have been on a lot of medicines that did not help me or my symptoms. I did attend group session last night. I will try to attend groups this evening". Noell continues to endorse passive suicidal ideations. He currently denies any HI, AVH, delusional thoughts or paranoia. He does not appear to be responding to any internal stimuli. He is contracting for safety. Patient's Lexapro was increased from 5 mg to 10 mg daily yesterday to help alleviate his depressive symptoms. There are no  other changes made on this plan of care below today. Reviewed current lab results; chol. 246 ^, HDL. 37, LDL. 183. Initiated Lipitor 20 mg po Q evenings yesterday for hyperlipidemia. Vital signs stable.   Principal Problem: Bipolar 1 disorder, depressed (HCC)  Diagnosis: Principal Problem:   Bipolar 1 disorder, depressed (HCC)  Total Time spent with patient: 45 minutes  Past Psychiatric History: See H&P.  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 echo 5/17: EF 60-65%, no RWMA, LV dias fxn nl, LA mildly dilated, RV sys fxn nl, PASP nl, moderate sized circumferential pericardial effusion was identified, 2.12 cm around the LV free wall, <1 cm around the RV free wall. Features were not c/w tamponade physiology   PTSD (post-traumatic stress disorder)    Witnessed brother's suicide.   Restless leg syndrome    Seizures (HCC)    Syncope     Past Surgical History:  Procedure Laterality Date   APPENDECTOMY     CARDIOVERSION N/A 04/17/2023   Procedure: CARDIOVERSION;  Surgeon: Constancia Delton, MD;  Location: [facility] ORS;  Service: Cardiovascular;  Laterality: N/A;   CYSTOSCOPY WITH URETEROSCOPY AND STENT PLACEMENT     ESOPHAGOGASTRODUODENOSCOPY N/A 01/11/2016   Procedure: ESOPHAGOGASTRODUODENOSCOPY (EGD);  Surgeon: Baldo Bonds, MD;  Location: [facility] ENDOSCOPY;  Service: Endoscopy;  Laterality: N/A;   ESOPHAGOGASTRODUODENOSCOPY N/A 04/09/2020   Procedure: ESOPHAGOGASTRODUODENOSCOPY (EGD);  Surgeon: Luke Salaam, MD;  Location: [facility] ENDOSCOPY;  Service: Gastroenterology;  Laterality: N/A;   INCISION AND DRAINAGE ABSCESS Left 01/02/2018    Procedure: INCISION AND DRAINAGE LEFT KNEE;  Surgeon: Marlynn Singer, MD;  Location: [facility] ORS;  Service: Orthopedics;  Laterality: Left;   JOINT REPLACEMENT Right    TKR   KNEE ARTHROSCOPY Right 06/25/2014   Procedure: ARTHROSCOPY KNEE;  Surgeon: Marlynn Singer, MD;  Location: [facility] ORS;  Service: Orthopedics;  Laterality: Right;  partial arthroscopic medial menisectomy   LAPAROSCOPIC APPENDECTOMY N/A 06/02/2021   Procedure: APPENDECTOMY LAPAROSCOPIC;  Surgeon: Flynn Hylan, MD;  Location: [facility] ORS;  Service: General;  Laterality: N/A;   TEE WITHOUT CARDIOVERSION N/A 04/17/2023   Procedure: ECHOCARDIOGRAM, TRANSESOPHAGEAL;  Surgeon: Constancia Delton, MD;  Location: [facility] ORS;  Service: Cardiovascular;  Laterality: N/A;   TOTAL KNEE ARTHROPLASTY Right 04/22/2015   Procedure: TOTAL KNEE ARTHROPLASTY;  Surgeon: Marlynn Singer, MD;  Location: [facility] ORS;  Service: Orthopedics;  Laterality: Right;   TOTAL KNEE ARTHROPLASTY Left 10/30/2017   Procedure: TOTAL KNEE ARTHROPLASTY;  Surgeon: Marlynn Singer, MD;  Location: [facility] ORS;  Service: Orthopedics;  Laterality: Left;   TOTAL KNEE REVISION Left 01/02/2018   Procedure: poly exchange of tibia and patella left knee;  Surgeon: Marlynn Singer, MD;  Location: [facility] ORS;  Service: Orthopedics;  Laterality: Left;   UMBILICAL HERNIA REPAIR  06/02/2021   Procedure: HERNIA REPAIR UMBILICAL ADULT;  Surgeon: Flynn Hylan, MD;  Location: [facility] ORS;  Service: General;;   Family History:  Family History  Problem Relation Age of Onset   CVA Mother        deceased at age 72   Depression Brother        Died by suicide at age 80   Family Psychiatric  History: See H&P.  Social History:  Social History   Substance and Sexual Activity  Alcohol Use Not Currently   Comment: rare     Social History   Substance and Sexual Activity  Drug Use Not Currently   Types: Marijuana, Cocaine   Comment: last use January 2025, cocaine    Social History    Socioeconomic History   Marital status: Single    Spouse name: Not on file   Number of children: Not on file   Years of education: Not on file   Highest education level: Not on file  Occupational History   Not on file  Tobacco Use   Smoking status: Former    Current packs/day: 0.00    Average packs/day: 0.8 packs/day for 20.0 years (15.0 ttl pk-yrs)    Types: Cigarettes    Start date: 05/16/1964    Quit date: 05/16/1984    Years since quitting: 39.0   Smokeless tobacco: Never  Vaping Use   Vaping status: Never Used  Substance and Sexual Activity   Alcohol use: Not Currently    Comment: rare   Drug use: Not Currently    Types: Marijuana, Cocaine    Comment: last use January 2025, cocaine   Sexual activity: Not Currently  Other Topics Concern   Not on file  Social History Narrative   ** Merged History Encounter **       Social Drivers of Health  Financial Resource Strain: Not on file  Food Insecurity: No Food Insecurity (05/30/2023)   Hunger Vital Sign    Worried About Running Out of Food in the Last Year: Never true    Ran Out of Food in the Last Year: Never true  Transportation Needs: No Transportation Needs (05/30/2023)   PRAPARE - Administrator, Civil Service (Medical): No    Lack of Transportation (Non-Medical): No  Physical Activity: Not on file  Stress: Not on file  Social Connections: Socially Isolated (02/15/2023)   Social Connection and Isolation Panel [NHANES]    Frequency of Communication with Friends and Family: Once a week    Frequency of Social Gatherings with Friends and Family: Once a week    Attends Religious Services: Never    Database administrator or Organizations: No    Attends Engineer, structural: Never    Marital Status: Never married   Additional Social History:   Sleep: Good  Appetite:  Good  Current Medications: Current Facility-Administered Medications  Medication Dose Route Frequency Provider Last Rate Last  Admin   acetaminophen  (TYLENOL ) tablet 650 mg  650 mg Oral Q6H PRN Mills, Shnese E, NP       alum & mag hydroxide-simeth (MAALOX/MYLANTA) 200-200-20 MG/5ML suspension 30 mL  30 mL Oral Q4H PRN Mills, Shnese E, NP       amiodarone  (PACERONE ) tablet 200 mg  200 mg Oral Daily Mills, Shnese E, NP   200 mg at 06/02/23 0751   atorvastatin (LIPITOR) tablet 20 mg  20 mg Oral Daily Elnoria Livingston I, NP   20 mg at 06/01/23 1704   haloperidol  (HALDOL ) tablet 5 mg  5 mg Oral TID PRN Doneen Fuelling, NP       And   diphenhydrAMINE  (BENADRYL ) capsule 50 mg  50 mg Oral TID PRN Mills, Shnese E, NP       haloperidol  lactate (HALDOL ) injection 5 mg  5 mg Intramuscular TID PRN Doneen Fuelling, NP       And   diphenhydrAMINE  (BENADRYL ) injection 50 mg  50 mg Intramuscular TID PRN Doneen Fuelling, NP       And   LORazepam  (ATIVAN ) injection 2 mg  2 mg Intramuscular TID PRN Mills, Shnese E, NP       haloperidol  lactate (HALDOL ) injection 10 mg  10 mg Intramuscular TID PRN Doneen Fuelling, NP       And   diphenhydrAMINE  (BENADRYL ) injection 50 mg  50 mg Intramuscular TID PRN Doneen Fuelling, NP       And   LORazepam  (ATIVAN ) injection 2 mg  2 mg Intramuscular TID PRN Mills, Shnese E, NP       divalproex  (DEPAKOTE ) DR tablet 750 mg  750 mg Oral BID Mills, Shnese E, NP   750 mg at 06/02/23 0751   doxepin  (SINEQUAN ) capsule 50 mg  50 mg Oral QHS Mills, Shnese E, NP   50 mg at 06/01/23 2108   escitalopram (LEXAPRO) tablet 10 mg  10 mg Oral QHS Amato Sevillano I, NP   10 mg at 06/01/23 2108   feeding supplement (ENSURE ENLIVE / ENSURE PLUS) liquid 237 mL  237 mL Oral TID BM Mills, Shnese E, NP   237 mL at 06/01/23 1331   lurasidone  (LATUDA ) tablet 20 mg  20 mg Oral Q supper Mills, Shnese E, NP   20 mg at 06/01/23 1647   magnesium  hydroxide (MILK OF MAGNESIA) suspension 30 mL  30 mL Oral Daily PRN Mills, Shnese E, NP       melatonin tablet 5 mg  5 mg Oral QHS Mills, Shnese E, NP   5 mg at 05/30/23 2048   metoprolol  succinate  (TOPROL -XL) 24 hr tablet 25 mg  25 mg Oral Daily Mills, Shnese E, NP   25 mg at 06/02/23 1610   multivitamin with minerals tablet 1 tablet  1 tablet Oral Daily Mills, Shnese E, NP   1 tablet at 06/02/23 0751   tamsulosin  (FLOMAX ) capsule 0.4 mg  0.4 mg Oral QPC supper [name], NP   0.4 mg at 06/01/23 1705   traMADol  (ULTRAM ) tablet 50 mg  50 mg Oral Q6H PRN Mills, Shnese E, NP   50 mg at 06/01/23 1649    Lab Results:  Results for orders placed or performed during the hospital encounter of 05/30/23 (from the past 48 hours)  Hemoglobin A1c     Status: None   Collection Time: 06/01/23  6:19 AM  Result Value Ref Range   Hgb A1c MFr Bld 4.9 4.8 - 5.6 %    Comment: (NOTE) Pre diabetes:          5.7%-6.4%  Diabetes:              >6.4%  Glycemic control for   <7.0% adults with diabetes    Mean Plasma Glucose 93.93 mg/dL    Comment: Performed at [facility], [address], [place], [place] [address]  Lipid panel     Status: Abnormal   Collection Time: 06/01/23  6:19 AM  Result Value Ref Range   Cholesterol 246 (H) 0 - 200 mg/dL   Triglycerides 409 <811 mg/dL   HDL 37 (L) >91 mg/dL   Total CHOL/HDL Ratio 6.6 RATIO   VLDL 26 0 - 40 mg/dL   LDL Cholesterol 478 (H) 0 - 99 mg/dL    Comment:        Total Cholesterol/HDL:CHD Risk Coronary Heart Disease Risk Table                     Men   Women  1/2 Average Risk   3.4   3.3  Average Risk       5.0   4.4  2 X Average Risk   9.6   7.1  3 X Average Risk  23.4   11.0        Use the calculated Patient Ratio above and the CHD Risk Table to determine the patient's CHD Risk.        ATP III CLASSIFICATION (LDL):  <100     mg/dL   Optimal  295-621  mg/dL   Near or Above                    Optimal  130-159  mg/dL   Borderline  308-657  mg/dL   High  >846     mg/dL   Very High Performed at [facility], [address] [place], [place] [address]     Blood Alcohol level:  Lab Results  Component Value  Date   [facility] <15 05/29/2023   ETH <10 04/12/2023    Metabolic Disorder Labs: Lab Results  Component Value Date   HGBA1C 4.9 06/01/2023   MPG 93.93 06/01/2023   MPG 111.15 12/29/2022   No results found for: "PROLACTIN" Lab Results  Component Value Date   CHOL 246 (H) 06/01/2023   TRIG  129 06/01/2023   HDL 37 (L) 06/01/2023   CHOLHDL 6.6 06/01/2023   VLDL 26 06/01/2023   LDLCALC 183 (H) 06/01/2023   LDLCALC 146 (H) 12/29/2022    Physical Findings: AIMS:  , ,  ,  ,    CIWA:    COWS:     Musculoskeletal: Strength & Muscle Tone: within normal limits Gait & Station: normal Patient leans: N/A  Psychiatric Specialty Exam:  Presentation  General Appearance:  Casual; Fairly Groomed  Eye Contact: Good  Speech: Clear and Coherent; Normal Rate  Speech Volume: Normal  Handedness: Right   Mood and Affect  Mood: Anxious; Depressed  Affect: Congruent   Thought Process  Thought Processes: Coherent; Goal Directed  Descriptions of Associations:Intact  Orientation:Full (Time, Place and Person)  Thought Content:Logical  History of Schizophrenia/Schizoaffective disorder:No  Duration of Psychotic Symptoms:N/A  Hallucinations:No data recorded  Ideas of Reference:None  Suicidal Thoughts:No data recorded  Homicidal Thoughts:No data recorded   Sensorium  Memory: Immediate Good; Recent Good; Remote Good  Judgment: Fair  Insight: Fair   Art therapist  Concentration: Good  Attention Span: Good  Recall: Good  Fund of Knowledge: Fair  Language: Fair   Psychomotor Activity  Psychomotor Activity: No data recorded   Assets  Assets: Communication Skills; Desire for Improvement; Resilience; Housing; Social Support  Sleep  Sleep: No data recorded  Physical Exam: Physical Exam Vitals and nursing note reviewed.  HENT:     Head: Normocephalic.     Nose: Nose normal.     Mouth/Throat:     Pharynx: Oropharynx is clear.   Cardiovascular:     Rate and Rhythm: Normal rate.     Pulses: Normal pulses.  Pulmonary:     Effort: Pulmonary effort is normal.  Genitourinary:    Comments: Deferred Musculoskeletal:        General: Normal range of motion.     Cervical back: Normal range of motion.  Skin:    General: Skin is dry.  Neurological:     General: No focal deficit present.     Mental Status: He is alert and oriented to person, place, and time.    Review of Systems  Constitutional:  Negative for chills, diaphoresis and fever.  HENT:  Negative for congestion and sore throat.   Eyes:  Negative for blurred vision.  Respiratory:  Negative for cough, shortness of breath and wheezing.   Cardiovascular:  Negative for chest pain and palpitations.  Gastrointestinal:  Negative for abdominal pain, constipation, diarrhea, heartburn, nausea and vomiting.  Genitourinary:  Negative for dysuria.  Musculoskeletal:  Negative for joint pain and myalgias.  Neurological:  Negative for dizziness, tingling, tremors, sensory change, speech change, focal weakness, seizures, loss of consciousness, weakness and headaches.  Endo/Heme/Allergies:        Allergies: Ketorolac .  Psychiatric/Behavioral:  Positive for depression and suicidal ideas. Negative for hallucinations, memory loss and substance abuse (Hx. substance use.). The patient has insomnia (I slept off & on.). The patient is not nervous/anxious.    Blood pressure 97/71, pulse 72, temperature (!) 97.4 F (36.3 C), temperature source Oral, resp. rate 17, height 5\' 9"  (1.753 m), weight 77 kg, SpO2 98%. Body mass index is 25.07 kg/m.  Treatment Plan Summary: Daily contact with patient to assess and evaluate symptoms and progress in treatment and Medication management.   Plan: The risks/benefits/side-effects/alternatives to the medications in use were discussed in detail with the patient and time was given for patient's questions. The patient consents to medication  trial.     -Continue Depakote  750 mg po bid for mood stability/seizures.  -Continue Doze[in 50 mg po Q hs for insomnia. -Increased Lexapro to 10 mg po daily for depression. -Continue Melatonin 5 mg po at bedtime for insomnia.  -Continue Latuda  20 mg po Q hs with supper for mood control.    Other medical issues.  -Continue Flomax  .04 po daily for Prostate health.  -Continue Metoprolol  25 mg po bid for HTN.  -Continue 500 mg po for low blood levels.  -amiodarone  750 mg po daily for irregular heart beat. -Initiated Lipitor 20 mg po daily for hyperlipidemia.   Other PRNS -Continue Tylenol  650 mg every 6 hours PRN for mild pain -Continue Maalox 30 ml Q 4 hrs PRN for indigestion -Continue MOM 30 ml po Q 6 hrs for constipation   Safety and Monitoring: Voluntary admission to inpatient psychiatric unit for safety, stabilization and treatment Daily contact with patient to assess and evaluate symptoms and progress in treatment Patient's case to be discussed in multi-disciplinary team meeting Observation Level : q15 minute checks Vital signs: q12 hours Precautions: Safety   Discharge Planning: Social work and case management to assist with discharge planning and identification of hospital follow-up needs prior to discharge Estimated LOS: 5-7 days Discharge Concerns: Need to establish a safety plan; Medication compliance and effectiveness Discharge Goals: Return home with outpatient referrals for mental health follow-up including medication management/psychotherapy Asuncion Layer, NP 06/02/2023, 10:14 AM Patient ID: [name], male   DOB: [birth year], 63 y.o.   MRN: [id number]

## 2023-06-02 NOTE — Plan of Care (Signed)
  Problem: Coping: Goal: Ability to verbalize frustrations and anger appropriately will improve Outcome: Progressing Goal: Ability to demonstrate self-control will improve Outcome: Progressing   Problem: Safety: Goal: Periods of time without injury will increase Outcome: Progressing   Problem: Medication: Goal: Compliance with prescribed medication regimen will improve Outcome: Progressing   

## 2023-06-02 NOTE — Progress Notes (Signed)
   06/02/23 0920  Psych Admission Type (Psych Patients Only)  Admission Status Voluntary  Psychosocial Assessment  Patient Complaints Anxiety;Depression  Eye Contact Fair  Facial Expression Flat  Affect Sad;Flat  Speech Logical/coherent  Interaction Assertive  Motor Activity Slow  Appearance/Hygiene Disheveled  Behavior Characteristics Calm;Cooperative  Mood Depressed  Thought Process  Coherency WDL  Content WDL  Delusions None reported or observed  Perception WDL  Hallucination None reported or observed  Judgment WDL  Confusion None  Danger to Self  Current suicidal ideation? Passive  Agreement Not to Harm Self Yes  Description of Agreement Verbal  Danger to Others  Danger to Others None reported or observed

## 2023-06-02 NOTE — Group Note (Signed)
 Recreation Therapy Group Note   Group Topic:Leisure Education  Group Date: 06/02/2023 Start Time: 0935 End Time: 1005 Facilitators: Carissa Musick-McCall, LRT,CTRS Location: [address] Day[address]   Group Topic: Leisure Education   Goal Area(s) Addresses:  Patient will successfully identify positive leisure and recreation activities.  Patient will acknowledge benefits of participation in healthy leisure activities post discharge.  Patient will actively work with peers toward a shared goal.   Behavioral Response:    Intervention: Competitive Group Game    Activity: Pictionary. In groups of 5-7, patients took turns trying to guess the picture being drawn on the board by their teammate.  If the team guessed the correct answer, they won a point.  If the team guessed wrong, the other team got a chance to steal the point. After several rounds of game play, the team with the most points were declared winners. Post-activity discussion reviewed benefits of positive recreation outlets: reducing stress, improving coping mechanisms, increasing self-esteem, and building larger support systems.   Education:  Teacher, English as a foreign language, Leisure as Merchant navy officer, Programmer, applications, Building control surveyor   Education Outcome: Acknowledges education/In group clarification offered/Needs additional education   Affect/Mood: N/A   Participation Level: Did not attend    Clinical Observations/Individualized Feedback:     Plan: Continue to engage patient in RT group sessions 2-3x/week.   Montia Haslip-McCall, LRT,CTRS 06/02/2023 12:00 PM

## 2023-06-02 NOTE — Group Note (Signed)
 Date:  06/02/2023 Time:  9:19 AM  Group Topic/Focus:  Goals Group:   The focus of this group is to help patients establish daily goals to achieve during treatment and discuss how the patient can incorporate goal setting into their daily lives to aide in recovery.    Participation Level:  Did Not Attend  [name] 06/02/2023, 9:19 AM

## 2023-06-02 NOTE — Progress Notes (Signed)
   06/01/23 2332  Psych Admission Type (Psych Patients Only)  Admission Status Voluntary  Psychosocial Assessment  Patient Complaints Anxiety;Depression  Eye Contact Fair  Facial Expression Flat  Affect Appropriate to circumstance  Speech Logical/coherent  Interaction Assertive  Motor Activity Slow  Appearance/Hygiene Disheveled  Behavior Characteristics Cooperative;Calm  Mood Depressed  Thought Process  Coherency WDL  Content WDL  Delusions None reported or observed  Perception WDL  Hallucination None reported or observed  Judgment WDL  Confusion None  Danger to Self  Current suicidal ideation? Passive  Agreement Not to Harm Self Yes  Description of Agreement verbal  Danger to Others  Danger to Others None reported or observed

## 2023-06-02 NOTE — [facility] Group Notes (Signed)
 Adult Psychoeducational Group Note  Date:  06/02/2023 Time:  9:43 PM  Group Topic/Focus:  Wrap-Up Group:   The focus of this group is to help patients review their daily goal of treatment and discuss progress on daily workbooks.  Participation Level:  Active  Participation Quality:  Attentive  Affect:  Appropriate  Cognitive:  Alert  Insight: Improving  Engagement in Group:  Engaged  Modes of Intervention:  Discussion  Additional Comments:  Pt attended and participated in AA group.  Thomasine Flick Cassandra 06/02/2023, 9:43 PM

## 2023-06-03 DIAGNOSIS — F319 Bipolar disorder, unspecified: Secondary | ICD-10-CM | POA: Diagnosis not present

## 2023-06-03 MED ORDER — PRAZOSIN HCL 1 MG PO CAPS
1.0000 mg | ORAL_CAPSULE | Freq: Every day | ORAL | Status: DC
  Administered 2023-06-03: 1 mg via ORAL
  Filled 2023-06-03: qty 1

## 2023-06-03 NOTE — [facility] Suicide Risk Assessment (Signed)
 CSW attempted to contact Legal Guardian, Waverly Hageman, unable to reach, left HIPAA compliant VM requesting return call.   Derrill Flirt, MSW, [place] 06/03/2023 1:52 PM

## 2023-06-03 NOTE — Progress Notes (Addendum)
 [facility] MD Progress Note  06/03/2023 1:22 PM [name]  MRN:  [id number] Subjective:   63 year old Caucasian male, single, homeless, unemployed. Background history of MDD recurrent, anxiety disorder, alcohol use disorder in remission and multiple medical comorbidities. Presented with worsening depression associated with suicidal thoughts. Depression driven by multiple psychosocial stressors. Significant other developed dementia and currently in long-term care. Patient does not have any support structures in the community.  Chart reviewed today.  Patient discussed at multidisciplinary team meeting.  Nursing staff reports that he has been adherent on his medication.  He continues to request as needed tramadol .  No challenging behavior.  Seen today.  Patient states that nothing has changed for him.  States that he continues to feel hopeless or worthless.  Patient states that this is not an order for him.  States that nobody looks out for him.  He tells me that he has been ruminating a lot on the loss of his brother.  States that he has been having nightmares relating to his brother's death by suicide.  He has never taken any medication to help with the nightmares.  Patient is not endorsing any new stressors.  He is not experiencing any manic features.  There are no psychotic features.  No current suicidal thoughts.  No rageful thoughts towards others.  No adverse effects from his medication.  I probed him about use of tramadol .  He states that he usually has cramps in his muscle.  Risk of tolerance and dependence was discussed with him.  Need to discontinue this medication was discussed with him.  I have encouraged him to hydrate self appropriately.   Principal Problem: Bipolar 1 disorder, depressed (HCC) Diagnosis: Principal Problem:   Bipolar 1 disorder, depressed (HCC)  Total Time spent with patient: 30 minutes  Past Psychiatric History:  See H&P.  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 echo 5/17: EF 60-65%, no RWMA, LV dias fxn nl, LA mildly dilated, RV sys fxn nl, PASP nl, moderate sized circumferential pericardial effusion was identified, 2.12 cm around the LV free wall, <1 cm around the RV free wall. Features were not c/w tamponade physiology   PTSD (post-traumatic stress disorder)    Witnessed brother's suicide.   Restless leg syndrome    Seizures (HCC)    Syncope     Past Surgical History:  Procedure Laterality Date   APPENDECTOMY     CARDIOVERSION N/A 04/17/2023   Procedure: CARDIOVERSION;  Surgeon: Constancia Delton, MD;  Location: [facility] ORS;  Service: Cardiovascular;  Laterality: N/A;   CYSTOSCOPY WITH URETEROSCOPY AND STENT PLACEMENT     ESOPHAGOGASTRODUODENOSCOPY N/A 01/11/2016   Procedure: ESOPHAGOGASTRODUODENOSCOPY (EGD);  Surgeon: Baldo Bonds, MD;  Location: [facility] ENDOSCOPY;  Service: Endoscopy;  Laterality: N/A;   ESOPHAGOGASTRODUODENOSCOPY N/A 04/09/2020   Procedure: ESOPHAGOGASTRODUODENOSCOPY (EGD);  Surgeon: Luke Salaam, MD;  Location: [facility] ENDOSCOPY;  Service: Gastroenterology;  Laterality: N/A;   INCISION AND DRAINAGE ABSCESS Left 01/02/2018   Procedure: INCISION AND DRAINAGE LEFT KNEE;  Surgeon: Marlynn Singer, MD;  Location: [facility] ORS;  Service: Orthopedics;  Laterality: Left;   JOINT REPLACEMENT Right    TKR   KNEE ARTHROSCOPY Right 06/25/2014   Procedure: ARTHROSCOPY KNEE;  Surgeon: Marlynn Singer, MD;  Location: [facility] ORS;  Service: Orthopedics;  Laterality: Right;  partial arthroscopic medial menisectomy   LAPAROSCOPIC APPENDECTOMY N/A 06/02/2021   Procedure: APPENDECTOMY  LAPAROSCOPIC;  Surgeon: Flynn Hylan, MD;  Location: [facility] ORS;  Service: General;  Laterality: N/A;   TEE WITHOUT CARDIOVERSION N/A 04/17/2023   Procedure: ECHOCARDIOGRAM, TRANSESOPHAGEAL;  Surgeon: Constancia Delton, MD;  Location: [facility] ORS;  Service: Cardiovascular;  Laterality: N/A;   TOTAL KNEE ARTHROPLASTY Right 04/22/2015   Procedure: TOTAL KNEE ARTHROPLASTY;  Surgeon: Marlynn Singer, MD;  Location: [facility] ORS;  Service: Orthopedics;  Laterality: Right;   TOTAL KNEE ARTHROPLASTY Left 10/30/2017   Procedure: TOTAL KNEE ARTHROPLASTY;  Surgeon: Marlynn Singer, MD;  Location: [facility] ORS;  Service: Orthopedics;  Laterality: Left;   TOTAL KNEE REVISION Left 01/02/2018   Procedure: poly exchange of tibia and patella left knee;  Surgeon: Marlynn Singer, MD;  Location: [facility] ORS;  Service: Orthopedics;  Laterality: Left;   UMBILICAL HERNIA REPAIR  06/02/2021   Procedure: HERNIA REPAIR UMBILICAL ADULT;  Surgeon: Flynn Hylan, MD;  Location: [facility] ORS;  Service: General;;   Family History:  Family History  Problem Relation Age of Onset   CVA Mother        deceased at age 63   Depression Brother        Died by suicide at age 55   Family Psychiatric  History:  See H&P.  Social History:  Social History   Substance and Sexual Activity  Alcohol Use Not Currently   Comment: rare     Social History   Substance and Sexual Activity  Drug Use Not Currently   Types: Marijuana, Cocaine   Comment: last use January 2025, cocaine    Social History   Socioeconomic History   Marital status: Single    Spouse name: Not on file   Number of children: Not on file   Years of education: Not on file   Highest education level: Not on file  Occupational History   Not on file  Tobacco Use   Smoking status: Former    Current packs/day: 0.00    Average packs/day: 0.8 packs/day for 20.0 years (15.0 ttl pk-yrs)    Types: Cigarettes    Start date: 05/16/1964    Quit date: 05/16/1984    Years since  quitting: 39.0   Smokeless tobacco: Never  Vaping Use   Vaping status: Never Used  Substance and Sexual Activity   Alcohol use: Not Currently    Comment: rare   Drug use: Not Currently    Types: Marijuana, Cocaine    Comment: last use January 2025, cocaine   Sexual activity: Not Currently  Other Topics Concern   Not on file  Social History Narrative   ** Merged History Encounter **       Social Drivers of Health   Financial Resource Strain: Not on file  Food Insecurity: No Food Insecurity (05/30/2023)   Hunger Vital Sign    Worried About Running Out of Food in the Last Year: Never true    Ran Out of Food in the Last Year: Never true  Transportation Needs: No Transportation Needs (05/30/2023)   PRAPARE - Administrator, Civil Service (Medical): No    Lack of Transportation (Non-Medical): No  Physical Activity: Not on file  Stress:  Not on file  Social Connections: Socially Isolated (02/15/2023)   Social Connection and Isolation Panel [NHANES]    Frequency of Communication with Friends and Family: Once a week    Frequency of Social Gatherings with Friends and Family: Once a week    Attends Religious Services: Never    Database administrator or Organizations: No    Attends Engineer, structural: Never    Marital Status: Never married   Additional Social History:    Current Medications: Current Facility-Administered Medications  Medication Dose Route Frequency Provider Last Rate Last Admin   acetaminophen  (TYLENOL ) tablet 650 mg  650 mg Oral Q6H PRN Mills, Shnese E, NP       alum & mag hydroxide-simeth (MAALOX/MYLANTA) 200-200-20 MG/5ML suspension 30 mL  30 mL Oral Q4H PRN Mills, Shnese E, NP       amiodarone  (PACERONE ) tablet 200 mg  200 mg Oral Daily Mills, Shnese E, NP   200 mg at 06/03/23 0815   atorvastatin (LIPITOR) tablet 20 mg  20 mg Oral Daily [name], [name], NP   20 mg at 06/02/23 1728   haloperidol  (HALDOL ) tablet 5 mg  5 mg Oral TID PRN Doneen Fuelling, NP       And   diphenhydrAMINE  (BENADRYL ) capsule 50 mg  50 mg Oral TID PRN Mills, Shnese E, NP       haloperidol  lactate (HALDOL ) injection 5 mg  5 mg Intramuscular TID PRN Doneen Fuelling, NP       And   diphenhydrAMINE  (BENADRYL ) injection 50 mg  50 mg Intramuscular TID PRN Doneen Fuelling, NP       And   LORazepam  (ATIVAN ) injection 2 mg  2 mg Intramuscular TID PRN Mills, Shnese E, NP       haloperidol  lactate (HALDOL ) injection 10 mg  10 mg Intramuscular TID PRN Doneen Fuelling, NP       And   diphenhydrAMINE  (BENADRYL ) injection 50 mg  50 mg Intramuscular TID PRN Doneen Fuelling, NP       And   LORazepam  (ATIVAN ) injection 2 mg  2 mg Intramuscular TID PRN Mills, Shnese E, NP       divalproex  (DEPAKOTE ) DR tablet 750 mg  750 mg Oral BID Mills, Shnese E, NP   750 mg at 06/03/23 0815   doxepin  (SINEQUAN ) capsule 50 mg  50 mg Oral QHS Mills, Shnese E, NP   50 mg at 06/02/23 2109   escitalopram (LEXAPRO) tablet 10 mg  10 mg Oral QHS [name], [name], NP   10 mg at 06/02/23 2109   feeding supplement (ENSURE ENLIVE / ENSURE PLUS) liquid 237 mL  237 mL Oral TID BM Mills, Shnese E, NP   237 mL at 06/02/23 2109   lurasidone  (LATUDA ) tablet 20 mg  20 mg Oral Q supper Mills, Shnese E, NP   20 mg at 06/02/23 1728   magnesium  hydroxide (MILK OF MAGNESIA) suspension 30 mL  30 mL Oral Daily PRN Mills, Shnese E, NP       melatonin tablet 5 mg  5 mg Oral QHS Mills, Shnese E, NP   5 mg at 05/30/23 2048   metoprolol  succinate (TOPROL -XL) 24 hr tablet 25 mg  25 mg Oral Daily Mills, Shnese E, NP   25 mg at 06/03/23 0815   multivitamin with minerals tablet 1 tablet  1 tablet Oral Daily [name], NP   1 tablet at 06/03/23 [phone]  tamsulosin  (FLOMAX ) capsule 0.4 mg  0.4 mg Oral QPC supper [name], NP   0.4 mg at 06/02/23 1728   traMADol  (ULTRAM ) tablet 50 mg  50 mg Oral Q6H PRN Mills, Shnese E, NP   50 mg at 06/03/23 4098    Lab Results: No results found for this or any previous visit  (from the past 48 hours).  Blood Alcohol level:  Lab Results  Component Value Date   [facility] <15 05/29/2023   ETH <10 04/12/2023    Metabolic Disorder Labs: Lab Results  Component Value Date   HGBA1C 4.9 06/01/2023   MPG 93.93 06/01/2023   MPG 111.15 12/29/2022   No results found for: "PROLACTIN" Lab Results  Component Value Date   CHOL 246 (H) 06/01/2023   TRIG 129 06/01/2023   HDL 37 (L) 06/01/2023   CHOLHDL 6.6 06/01/2023   VLDL 26 06/01/2023   LDLCALC 183 (H) 06/01/2023   LDLCALC 146 (H) 12/29/2022    Physical Findings: AIMS:  , ,  ,  ,    CIWA:    COWS:     Musculoskeletal: Strength & Muscle Tone: within normal limits Gait & Station: broad based Patient leans: N/A  Psychiatric Specialty Exam:  Presentation  General Appearance:  Casually dressed, slim build, not in any distress.  No EPS.  Eye Contact: Good.  Speech: Spontaneous.  Soft spoken.  Mood and Affect  Mood: Depressed.  Affect: Blunted and mood congruent.  Thought Process  Thought Processes: Linear and goal directed.  Descriptions of Associations:Intact  Orientation:Full (Time, Place and Person)  Thought Content: Negative ruminations about things that happened in the past.  Feelings of hopelessness or worthlessness.  No current suicidal thoughts.  No homicidal thoughts.  No thoughts of violence.  No delusional theme.  No obsessions.  Hallucinations: No hallucination in any modality.  Sensorium  Memory: Good.  Judgment: Good.  Insight: Good  Executive Functions  Concentration: Good.  Attention Span: Good.  Recall: Good.  Fund of Knowledge: Good.  Language: Good   Psychomotor Activity  Mild psychomotor retardation.  Physical Exam: Physical Exam ROS Blood pressure 119/76, pulse 71, temperature 97.6 F (36.4 C), temperature source Oral, resp. rate 19, height 5\' 9"  (1.753 m), weight 77 kg, SpO2 98%. Body mass index is 25.07 kg/m.   Treatment Plan  Summary: Patient has residual symptoms of depression.  He was recently started on an SSRI.  He is tolerating his medicine without any adverse effects.  We discussed use of prazosin  to target nightmares, patient consented to treatment after reviewing the pros and cons.  We will initiate prazosin  and discontinue tramadol .  We will evaluate him further.  1.  Continue Escitalopram  10 mg at bedtime. 2.  Prazosin  1 mg at bedtime to target nightmares. 3.  Continue Depakote  50 mg twice daily. 4.  Continue lurasidone  20 mg with dinner 5.  Continue Doxepin  50 mg at bedtime 6.  Encourage unit groups and therapeutic activities. 7.  Continue to monitor mood behavior and interaction with others. 8.  Social worker will coordinate discharge and aftercare planning.   Amelie Jury, MD 06/03/2023, 1:22 PM

## 2023-06-03 NOTE — Progress Notes (Signed)
   06/02/23 1948  Psych Admission Type (Psych Patients Only)  Admission Status Voluntary  Psychosocial Assessment  Patient Complaints Anxiety;Depression  Eye Contact Fair  Facial Expression Flat  Affect Sad;Flat  Speech Logical/coherent  Interaction Assertive  Motor Activity Slow  Appearance/Hygiene Unremarkable  Behavior Characteristics Calm;Appropriate to situation  Mood Depressed  Thought Process  Coherency WDL  Content WDL  Delusions None reported or observed  Perception WDL  Hallucination None reported or observed  Judgment WDL  Confusion None  Danger to Self  Current suicidal ideation? Passive  Agreement Not to Harm Self Yes  Description of Agreement verbal  Danger to Others  Danger to Others None reported or observed

## 2023-06-03 NOTE — Progress Notes (Signed)
   06/03/23 2210  Psych Admission Type (Psych Patients Only)  Admission Status Voluntary  Psychosocial Assessment  Patient Complaints Other (Comment) (chronic leg pain)  Eye Contact Fair  Facial Expression Flat  Affect Appropriate to circumstance  Speech Logical/coherent  Interaction Assertive  Motor Activity Slow  Appearance/Hygiene Unremarkable  Behavior Characteristics Appropriate to situation  Mood Depressed  Thought Process  Coherency WDL  Content WDL  Delusions None reported or observed  Perception WDL  Hallucination None reported or observed  Judgment WDL  Confusion None  Danger to Self  Current suicidal ideation? Denies  Self-Injurious Behavior No self-injurious ideation or behavior indicators observed or expressed   Agreement Not to Harm Self Yes  Description of Agreement verbal  Danger to Others  Danger to Others None reported or observed

## 2023-06-03 NOTE — [facility] Group Notes (Signed)
 LCSW Wellness Group Note   06/03/2023 1:00pm  Type of Group and Topic: Psychoeducational Group:  Wellness  Participation Level:  did not attend  Description of Group  Wellness group introduces the topic and its focus on developing healthy habits across the spectrum and its relationship to a decrease in hospital admissions.  Six areas of wellness are discussed: physical, social spiritual, intellectual, occupational, and emotional.  Patients are asked to consider their current wellness habits and to identify areas of wellness where they are interested and able to focus on improvements.    Therapeutic Goals Patients will understand components of wellness and how they can positively impact overall health.  Patients will identify areas of wellness where they have developed good habits. Patients will identify areas of wellness where they would like to make improvements.    Summary of Patient Progress     Therapeutic Modalities: Cognitive Behavioral Therapy Psychoeducation    Elspeth Hals, LCSW

## 2023-06-03 NOTE — Progress Notes (Signed)
   06/03/23 0900  Psych Admission Type (Psych Patients Only)  Admission Status Voluntary  Psychosocial Assessment  Patient Complaints Other (Comment) (leg pain)  Eye Contact Fair  Facial Expression Flat  Affect Sad;Flat  Speech Logical/coherent  Interaction Assertive  Motor Activity Slow  Appearance/Hygiene Unremarkable  Behavior Characteristics Appropriate to situation  Mood Depressed  Thought Process  Coherency WDL  Content WDL  Delusions None reported or observed  Perception WDL  Hallucination None reported or observed  Judgment WDL  Confusion None  Danger to Self  Agreement Not to Harm Self Yes  Description of Agreement verbal  Danger to Others  Danger to Others None reported or observed

## 2023-06-03 NOTE — Group Note (Signed)
 Date:  06/03/2023 Time:  9:15 AM  Group Topic/Focus:  Goals Group:   The focus of this group is to help patients establish daily goals to achieve during treatment and discuss how the patient can incorporate goal setting into their daily lives to aide in recovery. Orientation:   The focus of this group is to educate the patient on the purpose and policies of crisis stabilization and provide a format to answer questions about their admission.  The group details unit policies and expectations of patients while admitted.    Participation Level:  Did Not Attend

## 2023-06-03 NOTE — Plan of Care (Signed)
  Problem: Activity: Goal: Sleeping patterns will improve Outcome: Progressing   Problem: Coping: Goal: Ability to verbalize frustrations and anger appropriately will improve Outcome: Progressing   Problem: Safety: Goal: Periods of time without injury will increase Outcome: Progressing

## 2023-06-03 NOTE — [facility] Group Notes (Signed)
 [facility] Group Notes:  (Nursing/MHT/Case Management/Adjunct)  Date:  06/03/2023  Time:  2000  Type of Therapy:  Wrap up group  Participation Level:  Active  Participation Quality:  Appropriate, Attentive, Sharing, and Supportive  Affect:  Depressed  Cognitive:  Alert  Insight:  Improving  Engagement in Group:  Engaged  Modes of Intervention:  Clarification, Education, and Support  Summary of Progress/Problems: Positive thinking and positive change were discussed.   [name] 06/03/2023, 9:03 PM

## 2023-06-04 DIAGNOSIS — F319 Bipolar disorder, unspecified: Secondary | ICD-10-CM | POA: Diagnosis not present

## 2023-06-04 MED ORDER — PRAZOSIN HCL 1 MG PO CAPS
2.0000 mg | ORAL_CAPSULE | Freq: Every day | ORAL | Status: DC
  Administered 2023-06-04 – 2023-06-07 (×4): 2 mg via ORAL
  Filled 2023-06-04 (×4): qty 2

## 2023-06-04 NOTE — Plan of Care (Signed)
   Problem: Education: Goal: Mental status will improve Outcome: Progressing   Problem: Activity: Goal: Interest or engagement in activities will improve Outcome: Progressing

## 2023-06-04 NOTE — Progress Notes (Signed)
 [facility] MD Progress Note  06/04/2023 1:06 PM [name]  MRN:  [id number] Subjective:   63 year old Caucasian male, single, homeless, unemployed. Background history of MDD recurrent, anxiety disorder, alcohol use disorder in remission and multiple medical comorbidities. Presented with worsening depression associated with suicidal thoughts. Depression driven by multiple psychosocial stressors. Significant other developed dementia and currently in long-term care. Patient does not have any support structures in the community.  Chart reviewed today.  Patient discussed at multidisciplinary team meeting.  Nursing staff reports that patient slept for 6.25 hours.  He continues to report poor sleep at nighttime.  He has been adherent with his medication.  No challenging behavior.  He continues to endorse passive death wish.  Patient tolerated recent introduction of prazosin well.  States that he still had nightmares.  We have agreed to optimize the dose to 2 mg tonight. Patient continues to endorse a feeling of hopelessness and worthlessness.  States that he has had suicidal thoughts for years.  No current intent on harming himself.  No associated manic features.  No associated psychotic features. Encouraged to give his medications time to kick in.  Principal Problem: Bipolar 1 disorder, depressed (HCC) Diagnosis: Principal Problem:   Bipolar 1 disorder, depressed (HCC)  Total Time spent with patient: 30 minutes  Past Psychiatric History:  See H&P.  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 echo 5/17: EF 60-65%, no RWMA, LV dias fxn nl,  LA mildly dilated, RV sys fxn nl, PASP nl, moderate sized circumferential pericardial effusion was identified, 2.12 cm around the LV free wall, <1 cm around the RV free wall. Features were not c/w tamponade physiology   PTSD (post-traumatic stress disorder)    Witnessed brother's suicide.   Restless leg syndrome    Seizures (HCC)    Syncope     Past Surgical History:  Procedure Laterality Date   APPENDECTOMY     CARDIOVERSION N/A 04/17/2023   Procedure: CARDIOVERSION;  Surgeon: Constancia Delton, MD;  Location: [facility] ORS;  Service: Cardiovascular;  Laterality: N/A;   CYSTOSCOPY WITH URETEROSCOPY AND STENT PLACEMENT     ESOPHAGOGASTRODUODENOSCOPY N/A 01/11/2016   Procedure: ESOPHAGOGASTRODUODENOSCOPY (EGD);  Surgeon: Baldo Bonds, MD;  Location: [facility] ENDOSCOPY;  Service: Endoscopy;  Laterality: N/A;   ESOPHAGOGASTRODUODENOSCOPY N/A 04/09/2020   Procedure: ESOPHAGOGASTRODUODENOSCOPY (EGD);  Surgeon: Luke Salaam, MD;  Location: [facility] ENDOSCOPY;  Service: Gastroenterology;  Laterality: N/A;   INCISION AND DRAINAGE ABSCESS Left 01/02/2018   Procedure: INCISION AND DRAINAGE LEFT KNEE;  Surgeon: Marlynn Singer, MD;  Location: [facility] ORS;  Service: Orthopedics;  Laterality: Left;   JOINT REPLACEMENT Right    TKR   KNEE ARTHROSCOPY Right 06/25/2014   Procedure: ARTHROSCOPY KNEE;  Surgeon: Marlynn Singer, MD;  Location: [facility] ORS;  Service: Orthopedics;  Laterality: Right;  partial arthroscopic medial menisectomy   LAPAROSCOPIC APPENDECTOMY N/A 06/02/2021   Procedure: APPENDECTOMY LAPAROSCOPIC;  Surgeon: Flynn Hylan, MD;  Location: [facility] ORS;  Service: General;  Laterality: N/A;   TEE WITHOUT CARDIOVERSION N/A 04/17/2023   Procedure: ECHOCARDIOGRAM,  TRANSESOPHAGEAL;  Surgeon: Constancia Delton, MD;  Location: [facility] ORS;  Service: Cardiovascular;  Laterality: N/A;   TOTAL KNEE ARTHROPLASTY Right 04/22/2015   Procedure: TOTAL KNEE ARTHROPLASTY;  Surgeon: Marlynn Singer, MD;  Location: [facility] ORS;   Service: Orthopedics;  Laterality: Right;   TOTAL KNEE ARTHROPLASTY Left 10/30/2017   Procedure: TOTAL KNEE ARTHROPLASTY;  Surgeon: Marlynn Singer, MD;  Location: [facility] ORS;  Service: Orthopedics;  Laterality: Left;   TOTAL KNEE REVISION Left 01/02/2018   Procedure: poly exchange of tibia and patella left knee;  Surgeon: Marlynn Singer, MD;  Location: [facility] ORS;  Service: Orthopedics;  Laterality: Left;   UMBILICAL HERNIA REPAIR  06/02/2021   Procedure: HERNIA REPAIR UMBILICAL ADULT;  Surgeon: Flynn Hylan, MD;  Location: [facility] ORS;  Service: General;;   Family History:  Family History  Problem Relation Age of Onset   CVA Mother        deceased at age 9   Depression Brother        Died by suicide at age 29   Family Psychiatric  History:  See H&P.  Social History:  Social History   Substance and Sexual Activity  Alcohol Use Not Currently   Comment: rare     Social History   Substance and Sexual Activity  Drug Use Not Currently   Types: Marijuana, Cocaine   Comment: last use January 2025, cocaine    Social History   Socioeconomic History   Marital status: Single    Spouse name: Not on file   Number of children: Not on file   Years of education: Not on file   Highest education level: Not on file  Occupational History   Not on file  Tobacco Use   Smoking status: Former    Current packs/day: 0.00    Average packs/day: 0.8 packs/day for 20.0 years (15.0 ttl pk-yrs)    Types: Cigarettes    Start date: 05/16/1964    Quit date: 05/16/1984    Years since quitting: 39.0   Smokeless tobacco: Never  Vaping Use   Vaping status: Never Used  Substance and Sexual Activity   Alcohol use: Not Currently    Comment: rare   Drug use: Not Currently    Types: Marijuana, Cocaine    Comment: last use January 2025, cocaine   Sexual activity: Not Currently  Other Topics Concern   Not on file  Social History Narrative   ** Merged History Encounter **       Social Drivers of Health    Financial Resource Strain: Not on file  Food Insecurity: No Food Insecurity (05/30/2023)   Hunger Vital Sign    Worried About Running Out of Food in the Last Year: Never true    Ran Out of Food in the Last Year: Never true  Transportation Needs: No Transportation Needs (05/30/2023)   PRAPARE - Administrator, Civil Service (Medical): No    Lack of Transportation (Non-Medical): No  Physical Activity: Not on file  Stress: Not on file  Social Connections: Socially Isolated (02/15/2023)   Social Connection and Isolation Panel [NHANES]    Frequency of Communication with Friends and Family: Once a week    Frequency of Social Gatherings with Friends and Family: Once a week    Attends Religious Services: Never    Database administrator or Organizations: No    Attends Banker Meetings: Never    Marital Status: Never married   Additional Social History:    Current  Medications: Current Facility-Administered Medications  Medication Dose Route Frequency Provider Last Rate Last Admin   acetaminophen  (TYLENOL ) tablet 650 mg  650 mg Oral Q6H PRN Mills, Shnese E, NP   650 mg at 06/04/23 0802   alum & mag hydroxide-simeth (MAALOX/MYLANTA) 200-200-20 MG/5ML suspension 30 mL  30 mL Oral Q4H PRN Mills, Shnese E, NP       amiodarone  (PACERONE ) tablet 200 mg  200 mg Oral Daily Mills, Shnese E, NP   200 mg at 06/04/23 0802   atorvastatin (LIPITOR) tablet 20 mg  20 mg Oral Daily [name], [name], NP   20 mg at 06/03/23 1704   haloperidol  (HALDOL ) tablet 5 mg  5 mg Oral TID PRN Doneen Fuelling, NP       And   diphenhydrAMINE  (BENADRYL ) capsule 50 mg  50 mg Oral TID PRN Mills, Shnese E, NP       haloperidol  lactate (HALDOL ) injection 5 mg  5 mg Intramuscular TID PRN Doneen Fuelling, NP       And   diphenhydrAMINE  (BENADRYL ) injection 50 mg  50 mg Intramuscular TID PRN Doneen Fuelling, NP       And   LORazepam  (ATIVAN ) injection 2 mg  2 mg Intramuscular TID PRN Mills, Shnese E, NP        haloperidol  lactate (HALDOL ) injection 10 mg  10 mg Intramuscular TID PRN Doneen Fuelling, NP       And   diphenhydrAMINE  (BENADRYL ) injection 50 mg  50 mg Intramuscular TID PRN Doneen Fuelling, NP       And   LORazepam  (ATIVAN ) injection 2 mg  2 mg Intramuscular TID PRN Mills, Shnese E, NP       divalproex  (DEPAKOTE ) DR tablet 750 mg  750 mg Oral BID Mills, Shnese E, NP   750 mg at 06/04/23 0802   doxepin  (SINEQUAN ) capsule 50 mg  50 mg Oral QHS Mills, Shnese E, NP   50 mg at 06/03/23 2114   escitalopram (LEXAPRO) tablet 10 mg  10 mg Oral QHS Asuncion Layer I, NP   10 mg at 06/03/23 2114   feeding supplement (ENSURE ENLIVE / ENSURE PLUS) liquid 237 mL  237 mL Oral TID BM Mills, Shnese E, NP   237 mL at 06/02/23 2109   lurasidone  (LATUDA ) tablet 20 mg  20 mg Oral Q supper Mills, Shnese E, NP   20 mg at 06/03/23 1614   magnesium  hydroxide (MILK OF MAGNESIA) suspension 30 mL  30 mL Oral Daily PRN Mills, Shnese E, NP       melatonin tablet 5 mg  5 mg Oral QHS Mills, Shnese E, NP   5 mg at 05/30/23 2048   metoprolol  succinate (TOPROL -XL) 24 hr tablet 25 mg  25 mg Oral Daily Mills, Shnese E, NP   25 mg at 06/04/23 0802   multivitamin with minerals tablet 1 tablet  1 tablet Oral Daily [name], NP   1 tablet at 06/04/23 0801   prazosin (MINIPRESS) capsule 2 mg  2 mg Oral QHS Smt. Loder, Iline Mallory, MD       tamsulosin  (FLOMAX ) capsule 0.4 mg  0.4 mg Oral QPC supper Mills, Shnese E, NP   0.4 mg at 06/03/23 1704    Lab Results: No results found for this or any previous visit (from the past 48 hours).  Blood Alcohol level:  Lab Results  Component Value Date   [facility] <15 05/29/2023   ETH <10 04/12/2023  Metabolic Disorder Labs: Lab Results  Component Value Date   HGBA1C 4.9 06/01/2023   MPG 93.93 06/01/2023   MPG 111.15 12/29/2022   No results found for: "PROLACTIN" Lab Results  Component Value Date   CHOL 246 (H) 06/01/2023   TRIG 129 06/01/2023   HDL 37 (L) 06/01/2023   CHOLHDL  6.6 06/01/2023   VLDL 26 06/01/2023   LDLCALC 183 (H) 06/01/2023   LDLCALC 146 (H) 12/29/2022    Physical Findings: AIMS:  , ,  ,  ,    CIWA:    COWS:     Musculoskeletal: Strength & Muscle Tone: within normal limits Gait & Station: broad based Patient leans: N/A  Psychiatric Specialty Exam:  Presentation  General Appearance:  Casually dressed, slim build, in bed this morning, not in any distress.  No EPS.  Eye Contact: Good.  Speech: Spontaneous.  Soft spoken.  Mood and Affect  Mood: Depressed.  Affect: Blunted and mood congruent.  Thought Process  Thought Processes: Linear and goal directed.  Descriptions of Associations:Intact  Orientation:Full (Time, Place and Person)  Thought Content: Negative ruminations about things that happened in the past.  Feelings of hopelessness or worthlessness.  No current suicidal thoughts.  No homicidal thoughts.  No thoughts of violence.  No delusional theme.  No obsessions.  Hallucinations: No hallucination in any modality.  Sensorium  Memory: Good.  Judgment: Good.  Insight: Good  Executive Functions  Concentration: Good.  Attention Span: Good.  Recall: Good.  Fund of Knowledge: Good.  Language: Good   Psychomotor Activity  Mild psychomotor retardation.  Physical Exam: Physical Exam ROS Blood pressure 96/84, pulse 75, temperature (!) 97.5 F (36.4 C), temperature source Oral, resp. rate 16, height 5\' 9"  (1.753 m), weight 77 kg, SpO2 98%. Body mass index is 25.07 kg/m.   Treatment Plan Summary: Patient has residual symptoms of depression.  He was recently started on an SSRI.  He is tolerating his medicine without any adverse effects.  Prazosin was added yesterday.  We have agreed to optimize the dose.  We will evaluate him further.  1.  Escitalopram 10 mg at bedtime. 2.  Prazosin 2 mg at bedtime to target nightmares. 3.  Continue Depakote  50 mg twice daily. 4.  Continue lurasidone  20 mg  with dinner 5.  Continue Doxepin  50 mg at bedtime 6.  Encourage unit groups and therapeutic activities. 7.  Continue to monitor mood behavior and interaction with others. 8.  Social worker will coordinate discharge and aftercare planning.   Amelie Jury, MD 06/04/2023, 1:06 PM

## 2023-06-04 NOTE — Progress Notes (Signed)
   06/04/23 0546  15 Minute Checks  Location Bedroom  Visual Appearance Calm  Behavior Sleeping  Sleep (Behavioral Health Patients Only)  Calculate sleep? (Click Yes once per 24 hr at 0600 safety check) Yes  Documented sleep last 24 hours 6.25

## 2023-06-04 NOTE — Group Note (Signed)
 Date:  06/04/2023 Time:  5:19 PM  Group Topic/Focus:  Goals Group:   The focus of this group is to help patients establish daily goals to achieve during treatment and discuss how the patient can incorporate goal setting into their daily lives to aide in recovery. Orientation:   The focus of this group is to educate the patient on the purpose and policies of crisis stabilization and provide a format to answer questions about their admission.  The group details unit policies and expectations of patients while admitted.    Participation Level:  Did Not Attend  Sheryl Donna 06/04/2023, 5:19 PM

## 2023-06-04 NOTE — Group Note (Signed)
 Date:  06/04/2023 Time:  5:14 PM  Group Topic/Focus:   Healthy Communication:   The focus of this group is to discuss boundaries, barriers to setting effective boundaries, as well as healthy ways to communicate them with others.     Participation Level:  Did Not Attend    Sheryl Donna 06/04/2023, 5:14 PM

## 2023-06-04 NOTE — Progress Notes (Signed)
   06/04/23 2149  Psych Admission Type (Psych Patients Only)  Admission Status Voluntary  Psychosocial Assessment  Patient Complaints Depression  Eye Contact Fair  Facial Expression Flat  Affect Appropriate to circumstance  Speech Logical/coherent  Interaction Assertive  Motor Activity Slow  Appearance/Hygiene Unremarkable  Behavior Characteristics Appropriate to situation  Mood Depressed  Thought Process  Coherency WDL  Content WDL  Delusions None reported or observed  Perception WDL  Hallucination None reported or observed  Judgment WDL  Confusion None  Danger to Self  Current suicidal ideation? Denies  Self-Injurious Behavior No self-injurious ideation or behavior indicators observed or expressed   Agreement Not to Harm Self Yes  Description of Agreement verbal  Danger to Others  Danger to Others None reported or observed

## 2023-06-04 NOTE — Plan of Care (Signed)
  Problem: Education: Goal: Knowledge of [facility] Education information/materials will improve Outcome: Progressing Goal: Emotional status will improve Outcome: Progressing Goal: Mental status will improve Outcome: Progressing Goal: Verbalization of understanding the information provided will improve Outcome: Progressing   Problem: Coping: Goal: Ability to demonstrate self-control will improve Outcome: Progressing   Problem: Safety: Goal: Periods of time without injury will increase Outcome: Progressing   Problem: Activity: Goal: Interest or engagement in activities will improve Outcome: Not Progressing

## 2023-06-04 NOTE — [facility] Group Notes (Signed)
 [facility] Group Notes:  (Nursing/MHT/Case Management/Adjunct)  Date:  06/04/2023  Time:  8:55 PM  Type of Therapy:  Wrap-up group  Participation Level:  Active  Participation Quality:  Appropriate  Affect:  Appropriate  Cognitive:  Appropriate  Insight:  Appropriate  Engagement in Group:  Engaged  Modes of Intervention:  Education  Summary of Progress/Problems:Goal to get better. Rated day 6/10.  [name] 06/04/2023, 8:55 PM

## 2023-06-04 NOTE — Progress Notes (Signed)
 D: Patient is alert, oriented, and cooperative. Endorses SI with no plan and verbally contracts for safety. Denies HI and AVH. Patient is isolative to room. Patient complains of headache.    A: Scheduled medications administered per MD order. PRN tylenol  administered. Support provided. Patient educated on safety on the unit and medications. Routine safety checks every 15 minutes. Patient stated understanding to tell nurse about any new physical symptoms. Patient understands to tell staff of any needs.     R: No adverse drug reactions noted. Patient remains safe at this time and will continue to monitor.    06/04/23 1000  Psych Admission Type (Psych Patients Only)  Admission Status Voluntary  Psychosocial Assessment  Patient Complaints Depression  Eye Contact Fair  Facial Expression Flat  Affect Appropriate to circumstance  Speech Logical/coherent  Interaction Assertive;Isolative  Motor Activity Slow  Appearance/Hygiene Unremarkable  Behavior Characteristics Calm;Appropriate to situation  Mood Depressed  Thought Process  Coherency WDL  Content WDL  Delusions None reported or observed  Perception WDL  Hallucination None reported or observed  Judgment WDL  Confusion None  Danger to Self  Current suicidal ideation? Passive  Description of Suicide Plan no plan  Self-Injurious Behavior Some self-injurious ideation observed or expressed.  No lethal plan expressed   Agreement Not to Harm Self Yes  Description of Agreement verbal  Danger to Others  Danger to Others None reported or observed

## 2023-06-05 ENCOUNTER — Encounter (HOSPITAL_COMMUNITY): Payer: Self-pay

## 2023-06-05 DIAGNOSIS — F319 Bipolar disorder, unspecified: Secondary | ICD-10-CM | POA: Diagnosis not present

## 2023-06-05 MED ORDER — ESCITALOPRAM OXALATE 20 MG PO TABS
20.0000 mg | ORAL_TABLET | Freq: Every day | ORAL | Status: DC
  Administered 2023-06-05 – 2023-06-07 (×3): 20 mg via ORAL
  Filled 2023-06-05 (×3): qty 2

## 2023-06-05 NOTE — Plan of Care (Signed)
   Problem: Activity: Goal: Interest or engagement in activities will improve Outcome: Progressing   Problem: Coping: Goal: Ability to verbalize frustrations and anger appropriately will improve Outcome: Progressing   Problem: Safety: Goal: Periods of time without injury will increase Outcome: Progressing

## 2023-06-05 NOTE — Plan of Care (Signed)
  Problem: Activity: Goal: Interest or engagement in activities will improve Outcome: Progressing   Problem: Activity: Goal: Sleeping patterns will improve Outcome: Progressing   

## 2023-06-05 NOTE — Progress Notes (Signed)
 [facility] MD Progress Note  06/05/2023 12:18 PM [name]  MRN:  [id number]  Subjective:   63 year old Caucasian male, single, homeless, unemployed. Background history of MDD recurrent, anxiety disorder, alcohol use disorder in remission and multiple medical comorbidities. Presented with worsening depression associated with suicidal thoughts. Depression driven by multiple psychosocial stressors. Significant other developed dementia and currently in long-term care. Patient does not have any support structures in the community.  Chart reviewed today.  Patient discussed at multidisciplinary team meeting.  Vital signs reviewed without critical values.  Patient compliant with psychotropic medications.  As needed of Tylenol  x 1 required for mild pain.  Today's assessment notes: Patient seen and examined laying down on his bed.  Reports his mood is still depressed, and rates depression as #10/10 with 10 being high severity.  Chart reviewed with findings shared with the treatment team and consult with attending psychiatrist with recommendation to increase antidepressant of Prozac  from 10 mg to 20 mg p.o. daily for depression and anxiety.  Affect is congruent with mood.  Per chart review, patient depression seems to be chronic.  Speech is clear with normal volume and pattern.  Able to participate in answering assessment questions appropriately. Nursing staff reports that patient slept for 6.75 hours, although, he continues to report poor sleep at nighttime.  No challenging behavior.  He continues to endorse passive suicidal ideation without intent, plan or means to carry it out.  States that he still had nightmares. He continues on prazosin 2 mg p.o. q. nightly for nightmares.  Patient continues to endorse a feeling of hopelessness and worthlessness.  States that he has had suicidal thoughts for years.  No current intent on harming himself.  No associated manic features.  No associated psychotic features.   Encouraged to give his medications time to work, also informed in adjusting Prozac  from 10 mg to 20 mg.  Patient in agreement with medication adjustment.  We will monitor therapeutic effectiveness and safety.  Principal Problem: Bipolar 1 disorder, depressed (HCC) Diagnosis: Principal Problem:   Bipolar 1 disorder, depressed (HCC)  Total Time spent with patient: 30 minutes  Past Psychiatric History:  See H&P.  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 echo 5/17: EF 60-65%, no RWMA, LV dias fxn nl, LA mildly dilated, RV sys fxn nl, PASP nl, moderate sized circumferential pericardial effusion was identified, 2.12 cm around the LV free wall, <1 cm around the RV free wall. Features were not c/w tamponade physiology   PTSD (post-traumatic stress disorder)    Witnessed brother's suicide.   Restless leg syndrome    Seizures (HCC)    Syncope     Past Surgical History:  Procedure Laterality Date   APPENDECTOMY     CARDIOVERSION N/A 04/17/2023   Procedure: CARDIOVERSION;  Surgeon: Constancia Delton, MD;  Location: [facility] ORS;  Service: Cardiovascular;  Laterality: N/A;   CYSTOSCOPY WITH URETEROSCOPY AND STENT PLACEMENT     ESOPHAGOGASTRODUODENOSCOPY N/A 01/11/2016   Procedure: ESOPHAGOGASTRODUODENOSCOPY (EGD);  Surgeon: Baldo Bonds, MD;  Location:  [facility] ENDOSCOPY;  Service: Endoscopy;  Laterality: N/A;   ESOPHAGOGASTRODUODENOSCOPY N/A 04/09/2020   Procedure: ESOPHAGOGASTRODUODENOSCOPY (EGD);  Surgeon: Luke Salaam, MD;  Location: [facility] ENDOSCOPY;  Service: Gastroenterology;  Laterality: N/A;   INCISION AND DRAINAGE ABSCESS Left  01/02/2018   Procedure: INCISION AND DRAINAGE LEFT KNEE;  Surgeon: Marlynn Singer, MD;  Location: [facility] ORS;  Service: Orthopedics;  Laterality: Left;   JOINT REPLACEMENT Right    TKR   KNEE ARTHROSCOPY Right 06/25/2014   Procedure: ARTHROSCOPY KNEE;  Surgeon: Marlynn Singer, MD;  Location: [facility] ORS;  Service: Orthopedics;  Laterality: Right;  partial arthroscopic medial menisectomy   LAPAROSCOPIC APPENDECTOMY N/A 06/02/2021   Procedure: APPENDECTOMY LAPAROSCOPIC;  Surgeon: Flynn Hylan, MD;  Location: [facility] ORS;  Service: General;  Laterality: N/A;   TEE WITHOUT CARDIOVERSION N/A 04/17/2023   Procedure: ECHOCARDIOGRAM, TRANSESOPHAGEAL;  Surgeon: Constancia Delton, MD;  Location: [facility] ORS;  Service: Cardiovascular;  Laterality: N/A;   TOTAL KNEE ARTHROPLASTY Right 04/22/2015   Procedure: TOTAL KNEE ARTHROPLASTY;  Surgeon: Marlynn Singer, MD;  Location: [facility] OR[facility];  Service: Orthopedics;  Laterality: Right;   TOTAL KNEE ARTHROPLASTY Left 10/30/2017   Procedure: TOTAL KNEE ARTHROPLASTY;  Surgeon: Marlynn Singer, MD;  Location: [facility] OR[facility];  Service: Orthopedics;  Laterality: Left;   TOTAL KNEE REVISION Left 01/02/2018   Procedure: poly exchange of tibia and patella left knee;  Surgeon: Marlynn Singer, MD;  Location: [facility] OR[facility];  Service: Orthopedics;  Laterality: Left;   UMBILICAL HERNIA REPAIR  06/02/2021   Procedure: HERNIA REPAIR UMBILICAL ADULT;  Surgeon: Flynn Hylan, MD;  Location: [facility] ORS;  Service: General;;   Family History:  Family History  Problem Relation Age of Onset   CVA Mother        deceased at age 40   Depression Brother        Died by suicide at age 21   Family Psychiatric  History:  See H&P.  Social History:  Social History   Substance and Sexual Activity  Alcohol Use Not Currently   Comment: rare     Social History   Substance and Sexual Activity  Drug Use Not Currently   Types: Marijuana, Cocaine   Comment: last use January 2025, cocaine    Social History    Socioeconomic History   Marital status: Single    Spouse name: Not on file   Number of children: Not on file   Years of education: Not on file   Highest education level: Not on file  Occupational History   Not on file  Tobacco Use   Smoking status: Former    Current packs/day: 0.00    Average packs/day: 0.8 packs/day for 20.0 years (15.0 ttl pk-yrs)    Types: Cigarettes    Start date: 05/16/1964    Quit date: 05/16/1984    Years since quitting: 39.0   Smokeless tobacco: Never  Vaping Use   Vaping status: Never Used  Substance and Sexual Activity   Alcohol use: Not Currently    Comment: rare   Drug use: Not Currently    Types: Marijuana, Cocaine    Comment: last use January 2025, cocaine   Sexual activity: Not Currently  Other Topics Concern   Not on file  Social History Narrative   ** Merged History Encounter **       Social Drivers of Health   Financial Resource Strain: Not on file  Food Insecurity: No Food Insecurity (05/30/2023)   Hunger Vital Sign    Worried About Running Out of Food  in the Last Year: Never true    Ran Out of Food in the Last Year: Never true  Transportation Needs: No Transportation Needs (05/30/2023)   PRAPARE - Administrator, Civil Service (Medical): No    Lack of Transportation (Non-Medical): No  Physical Activity: Not on file  Stress: Not on file  Social Connections: Socially Isolated (02/15/2023)   Social Connection and Isolation Panel [NHANES]    Frequency of Communication with Friends and Family: Once a week    Frequency of Social Gatherings with Friends and Family: Once a week    Attends Religious Services: Never    Database administrator or Organizations: No    Attends Engineer, structural: Never    Marital Status: Never married   Additional Social History:    Current Medications: Current Facility-Administered Medications  Medication Dose Route Frequency Provider Last Rate Last Admin   acetaminophen  (TYLENOL )  tablet 650 mg  650 mg Oral Q6H PRN Mills, Shnese E, NP   650 mg at 06/04/23 2100   alum & mag hydroxide-simeth (MAALOX/MYLANTA) 200-200-20 MG/5ML suspension 30 mL  30 mL Oral Q4H PRN Mills, Shnese E, NP       amiodarone  (PACERONE ) tablet 200 mg  200 mg Oral Daily Mills, Shnese E, NP   200 mg at 06/05/23 0758   atorvastatin (LIPITOR) tablet 20 mg  20 mg Oral Daily [name], [name], NP   20 mg at 06/04/23 1718   haloperidol  (HALDOL ) tablet 5 mg  5 mg Oral TID PRN Doneen Fuelling, NP       And   diphenhydrAMINE  (BENADRYL ) capsule 50 mg  50 mg Oral TID PRN Mills, Shnese E, NP       haloperidol  lactate (HALDOL ) injection 5 mg  5 mg Intramuscular TID PRN Doneen Fuelling, NP       And   diphenhydrAMINE  (BENADRYL ) injection 50 mg  50 mg Intramuscular TID PRN Doneen Fuelling, NP       And   LORazepam  (ATIVAN ) injection 2 mg  2 mg Intramuscular TID PRN Mills, Shnese E, NP       haloperidol  lactate (HALDOL ) injection 10 mg  10 mg Intramuscular TID PRN Doneen Fuelling, NP       And   diphenhydrAMINE  (BENADRYL ) injection 50 mg  50 mg Intramuscular TID PRN Doneen Fuelling, NP       And   LORazepam  (ATIVAN ) injection 2 mg  2 mg Intramuscular TID PRN Mills, Shnese E, NP       divalproex  (DEPAKOTE ) DR tablet 750 mg  750 mg Oral BID Mills, Shnese E, NP   750 mg at 06/05/23 0757   doxepin  (SINEQUAN ) capsule 50 mg  50 mg Oral QHS Mills, Shnese E, NP   50 mg at 06/04/23 2101   escitalopram (LEXAPRO) tablet 10 mg  10 mg Oral QHS Asuncion Layer I, NP   10 mg at 06/04/23 2101   feeding supplement (ENSURE ENLIVE / ENSURE PLUS) liquid 237 mL  237 mL Oral TID BM Mills, Shnese E, NP   237 mL at 06/04/23 2103   lurasidone  (LATUDA ) tablet 20 mg  20 mg Oral Q supper Mills, Shnese E, NP   20 mg at 06/04/23 1718   magnesium  hydroxide (MILK OF MAGNESIA) suspension 30 mL  30 mL Oral Daily PRN Mills, Shnese E, NP       melatonin tablet 5 mg  5 mg Oral QHS Mills, Shnese E, NP  5 mg at 05/30/23 2048   metoprolol  succinate  (TOPROL -XL) 24 hr tablet 25 mg  25 mg Oral Daily Mills, Shnese E, NP   25 mg at 06/05/23 0757   multivitamin with minerals tablet 1 tablet  1 tablet Oral Daily [name], NP   1 tablet at 06/05/23 0757   prazosin (MINIPRESS) capsule 2 mg  2 mg Oral QHS [name], [name], MD   2 mg at 06/04/23 2101   tamsulosin  (FLOMAX ) capsule 0.4 mg  0.4 mg Oral QPC supper Mills, Shnese E, NP   0.4 mg at 06/04/23 1718   Lab Results: No results found for this or any previous visit (from the past 48 hours).  Blood Alcohol level:  Lab Results  Component Value Date   [facility] <15 05/29/2023   ETH <10 04/12/2023   Metabolic Disorder Labs: Lab Results  Component Value Date   HGBA1C 4.9 06/01/2023   MPG 93.93 06/01/2023   MPG 111.15 12/29/2022   No results found for: "PROLACTIN" Lab Results  Component Value Date   CHOL 246 (H) 06/01/2023   TRIG 129 06/01/2023   HDL 37 (L) 06/01/2023   CHOLHDL 6.6 06/01/2023   VLDL 26 06/01/2023   LDLCALC 183 (H) 06/01/2023   LDLCALC 146 (H) 12/29/2022    Physical Findings: AIMS:  , ,  ,  ,    CIWA:    COWS:     Musculoskeletal: Strength & Muscle Tone: within normal limits Gait & Station: broad based Patient leans: N/A  Psychiatric Specialty Exam:  Presentation  General Appearance:  Casually dressed, slim build, in bed this morning, not in any distress.  No EPS.  Eye Contact: Good.  Speech: Spontaneous.  Soft spoken.  Mood and Affect  Mood: Depressed.  Affect: Blunted and mood congruent.  Thought Process  Thought Processes: Linear and goal directed.  Descriptions of Associations:Intact  Orientation:Full (Time, Place and Person)  Thought Content: Negative ruminations about things that happened in the past.  Feelings of hopelessness or worthlessness.  No current suicidal thoughts.  No homicidal thoughts.  No thoughts of violence.  No delusional theme.  No obsessions.  Hallucinations: No hallucination in any modality.  Sensorium   Memory: Good.  Judgment: Good.  Insight: Good  Executive Functions  Concentration: Good.  Attention Span: Good.  Recall: Good.  Fund of Knowledge: Good.  Language: Good   Psychomotor Activity  Mild psychomotor retardation.  Physical Exam: Physical Exam Vitals and nursing note reviewed.  Constitutional:      General: He is not in acute distress.    Appearance: He is normal weight. He is not ill-appearing.  HENT:     Head: Normocephalic.     Right Ear: External ear normal.     Left Ear: External ear normal.     Nose: Nose normal.     Mouth/Throat:     Mouth: Mucous membranes are moist.     Pharynx: Oropharynx is clear.  Eyes:     Extraocular Movements: Extraocular movements intact.  Cardiovascular:     Rate and Rhythm: Normal rate.     Pulses: Normal pulses.  Pulmonary:     Effort: Pulmonary effort is normal.  Abdominal:     Comments: Deferred  Genitourinary:    Comments: Deferred Musculoskeletal:        General: Normal range of motion.     Cervical back: Normal range of motion.  Skin:    General: Skin is warm.  Neurological:  General: No focal deficit present.     Mental Status: He is alert and oriented to person, place, and time.  Psychiatric:        Mood and Affect: Mood normal.        Behavior: Behavior normal.   Review of Systems  Constitutional:  Negative for chills and fever.  HENT:  Negative for sore throat.   Eyes:  Negative for blurred vision.  Respiratory:  Negative for cough, sputum production, shortness of breath and wheezing.   Cardiovascular:  Negative for chest pain and palpitations.  Gastrointestinal:  Negative for heartburn and nausea.  Genitourinary:  Negative for dysuria and urgency.  Musculoskeletal:  Negative for falls.  Skin:  Negative for itching and rash.  Neurological:  Negative for dizziness, seizures and headaches.  Endo/Heme/Allergies:        See allergy listing  Psychiatric/Behavioral:  Positive for  depression and suicidal ideas. Negative for hallucinations and substance abuse. The patient is nervous/anxious. The patient does not have insomnia.    Blood pressure 112/81, pulse 79, temperature 97.7 F (36.5 C), temperature source Oral, resp. rate 16, height 5\' 9"  (1.753 m), weight 77 kg, SpO2 97%. Body mass index is 25.07 kg/m.   Treatment Plan Summary: Patient has residual symptoms of depression.  He was recently started on an SSRI.  He is tolerating his medicine without any adverse effects.  Prazosin  was added yesterday.  We have agreed to optimize the dose.  We will evaluate him further.  1.  Increase escitalopram  from 10 mg to 20 mg p.o. at bedtime. 2.  Prazosin  2 mg at bedtime to target nightmares. 3.  Continue Depakote  750 mg twice daily. 4.  Continue lurasidone  20 mg with dinner 5.  Continue Doxepin  50 mg at bedtime 6.  Encourage unit groups and therapeutic activities. 7.  Continue to monitor mood behavior and interaction with others. 8.  Social worker will coordinate discharge and aftercare planning.   Laurence Pons, FNP 06/05/2023, 12:18 PM Patient ID: [name], male   DOB: 1960/01/31, 63 y.o.   MRN: [id number]

## 2023-06-05 NOTE — BH IP Treatment Plan (Signed)
 Interdisciplinary Treatment and Diagnostic Plan Update  06/05/2023 Time of Session: 1641 UPDATE [name] MRN: [id number]  Principal Diagnosis: Bipolar 1 disorder, depressed (HCC)  Secondary Diagnoses: Principal Problem:   Bipolar 1 disorder, depressed (HCC)   Current Medications:  Current Facility-Administered Medications  Medication Dose Route Frequency Provider Last Rate Last Admin   acetaminophen  (TYLENOL ) tablet 650 mg  650 mg Oral Q6H PRN Mills, Shnese E, NP   650 mg at 06/05/23 1717   alum & mag hydroxide-simeth (MAALOX/MYLANTA) 200-200-20 MG/5ML suspension 30 mL  30 mL Oral Q4H PRN Mills, Shnese E, NP       amiodarone  (PACERONE ) tablet 200 mg  200 mg Oral Daily Mills, Shnese E, NP   200 mg at 06/05/23 0758   atorvastatin (LIPITOR) tablet 20 mg  20 mg Oral Daily [name], [name], NP   20 mg at 06/05/23 1720   haloperidol  (HALDOL ) tablet 5 mg  5 mg Oral TID PRN Doneen Fuelling, NP       And   diphenhydrAMINE  (BENADRYL ) capsule 50 mg  50 mg Oral TID PRN Mills, Shnese E, NP       haloperidol  lactate (HALDOL ) injection 5 mg  5 mg Intramuscular TID PRN Doneen Fuelling, NP       And   diphenhydrAMINE  (BENADRYL ) injection 50 mg  50 mg Intramuscular TID PRN Doneen Fuelling, NP       And   LORazepam  (ATIVAN ) injection 2 mg  2 mg Intramuscular TID PRN Mills, Shnese E, NP       haloperidol  lactate (HALDOL ) injection 10 mg  10 mg Intramuscular TID PRN Doneen Fuelling, NP       And   diphenhydrAMINE  (BENADRYL ) injection 50 mg  50 mg Intramuscular TID PRN Doneen Fuelling, NP       And   LORazepam  (ATIVAN ) injection 2 mg  2 mg Intramuscular TID PRN Mills, Shnese E, NP       divalproex  (DEPAKOTE ) DR tablet 750 mg  750 mg Oral BID Mills, Shnese E, NP   750 mg at 06/05/23 1719   doxepin  (SINEQUAN ) capsule 50 mg  50 mg Oral QHS Mills, Shnese E, NP   50 mg at 06/04/23 2101   escitalopram (LEXAPRO) tablet 20 mg  20 mg Oral QHS [name], [name], FNP       feeding supplement (ENSURE ENLIVE /  ENSURE PLUS) liquid 237 mL  237 mL Oral TID BM Mills, Shnese E, NP   237 mL at 06/05/23 1430   lurasidone  (LATUDA ) tablet 20 mg  20 mg Oral Q supper Mills, Shnese E, NP   20 mg at 06/05/23 1717   magnesium  hydroxide (MILK OF MAGNESIA) suspension 30 mL  30 mL Oral Daily PRN Mills, Shnese E, NP       melatonin tablet 5 mg  5 mg Oral QHS Mills, Shnese E, NP   5 mg at 05/30/23 2048   metoprolol  succinate (TOPROL -XL) 24 hr tablet 25 mg  25 mg Oral Daily Mills, Shnese E, NP   25 mg at 06/05/23 0757   multivitamin with minerals tablet 1 tablet  1 tablet Oral Daily [name], NP   1 tablet at 06/05/23 0757   prazosin (MINIPRESS) capsule 2 mg  2 mg Oral QHS [name], [name], MD   2 mg at 06/04/23 2101   tamsulosin  (FLOMAX ) capsule 0.4 mg  0.4 mg Oral QPC supper [name], NP   0.4 mg at 06/05/23  1717   PTA Medications: Medications Prior to Admission  Medication Sig Dispense Refill Last Dose/Taking   amiodarone  (PACERONE ) 200 MG tablet Take 1 tablet (200 mg total) by mouth 2 (two) times daily for 7 days, THEN 1 tablet (200 mg total) daily. 44 tablet 0    aspirin  EC 81 MG tablet Take 1 tablet (81 mg total) by mouth daily. Swallow whole.      divalproex  (DEPAKOTE ) 250 MG DR tablet Take 3 tablets (750 mg total) by mouth 2 (two) times daily. 180 tablet 0    doxepin  (SINEQUAN ) 50 MG capsule Take 1 capsule (50 mg total) by mouth at bedtime. 30 capsule 0    feeding supplement (ENSURE ENLIVE / ENSURE PLUS) LIQD Take 237 mLs by mouth 3 (three) times daily between meals. 237 mL 12    levOCARNitine  (CARNITOR ) 1 GM/10ML solution Take 5 mLs (500 mg total) by mouth daily.      lurasidone  (LATUDA ) 20 MG TABS tablet Take 4 tablets (80 mg total) by mouth daily with supper. (Patient taking differently: Take 20 mg by mouth daily with supper.) 120 tablet 0    melatonin 5 MG TABS Take 1 tablet (5 mg total) by mouth at bedtime. 30 tablet 0    metoprolol  succinate (TOPROL -XL) 25 MG 24 hr tablet Take 1 tablet (25  mg total) by mouth daily. 30 tablet 2    Multiple Vitamin (MULTIVITAMIN WITH MINERALS) TABS tablet Take 1 tablet by mouth daily. 30 tablet 0    tamsulosin  (FLOMAX ) 0.4 MG CAPS capsule Take 1 capsule (0.4 mg total) by mouth daily after supper.      traMADol  (ULTRAM ) 50 MG tablet Take 1 tablet (50 mg total) by mouth every 6 (six) hours as needed for severe pain (pain score 7-10). 30 tablet 0    venlafaxine  XR (EFFEXOR -XR) 150 MG 24 hr capsule Take 1 capsule (150 mg total) by mouth daily after breakfast. 30 capsule 0     Patient Stressors: Health problems   Medication change or noncompliance   Traumatic event    Patient Strengths: Average or above average intelligence  Capable of independent living  Communication skills  Motivation for treatment/growth  Work skills   Treatment Modalities: Medication Management, Group therapy, Case management,  1 to 1 session with clinician, Psychoeducation, Recreational therapy.   Physician Treatment Plan for Primary Diagnosis: Bipolar 1 disorder, depressed (HCC) Long Term Goal(s): Improvement in symptoms so as ready for discharge   Short Term Goals: Ability to identify and develop effective coping behaviors will improve Ability to maintain clinical measurements within normal limits will improve Compliance with prescribed medications will improve Ability to identify triggers associated with substance abuse/mental health issues will improve Ability to identify changes in lifestyle to reduce recurrence of condition will improve Ability to verbalize feelings will improve Ability to disclose and discuss suicidal ideas Ability to demonstrate self-control will improve  Medication Management: Evaluate patient's response, side effects, and tolerance of medication regimen.  Therapeutic Interventions: 1 to 1 sessions, Unit Group sessions and Medication administration.  Evaluation of Outcomes: Progressing  Physician Treatment Plan for Secondary Diagnosis:  Principal Problem:   Bipolar 1 disorder, depressed (HCC)  Long Term Goal(s): Improvement in symptoms so as ready for discharge   Short Term Goals: Ability to identify and develop effective coping behaviors will improve Ability to maintain clinical measurements within normal limits will improve Compliance with prescribed medications will improve Ability to identify triggers associated with substance abuse/mental health issues will improve Ability to  identify changes in lifestyle to reduce recurrence of condition will improve Ability to verbalize feelings will improve Ability to disclose and discuss suicidal ideas Ability to demonstrate self-control will improve     Medication Management: Evaluate patient's response, side effects, and tolerance of medication regimen.  Therapeutic Interventions: 1 to 1 sessions, Unit Group sessions and Medication administration.  Evaluation of Outcomes: Progressing   RN Treatment Plan for Primary Diagnosis: Bipolar 1 disorder, depressed (HCC) Long Term Goal(s): Knowledge of disease and therapeutic regimen to maintain health will improve  Short Term Goals: Ability to verbalize frustration and anger appropriately will improve, Ability to participate in decision making will improve, Ability to disclose and discuss suicidal ideas, and Ability to identify and develop effective coping behaviors will improve  Medication Management: RN will administer medications as ordered by provider, will assess and evaluate patient's response and provide education to patient for prescribed medication. RN will report any adverse and/or side effects to prescribing provider.  Therapeutic Interventions: 1 on 1 counseling sessions, Psychoeducation, Medication administration, Evaluate responses to treatment, Monitor vital signs and CBGs as ordered, Perform/monitor CIWA, COWS, AIMS and Fall Risk screenings as ordered, Perform wound care treatments as ordered.  Evaluation of Outcomes:  Progressing   LCSW Treatment Plan for Primary Diagnosis: Bipolar 1 disorder, depressed (HCC) Long Term Goal(s): Safe transition to appropriate next level of care at discharge, Engage patient in therapeutic group addressing interpersonal concerns.  Short Term Goals: Engage patient in aftercare planning with referrals and resources, Increase social support, Increase ability to appropriately verbalize feelings, and Increase emotional regulation  Therapeutic Interventions: Assess for all discharge needs, 1 to 1 time with Social worker, Explore available resources and support systems, Assess for adequacy in community support network, Educate family and significant other(s) on suicide prevention, Complete Psychosocial Assessment, Interpersonal group therapy.  Evaluation of Outcomes: Progressing   Progress in Treatment: Attending groups: No. Participating in groups: No. and As evidenced by:  Only participating in wrap-up group per chart review Taking medication as prescribed: Yes. Toleration medication: Yes. Family/Significant other contact made: No, will contact:  multiple attempts made to contact guardian Patient understands diagnosis: Yes. Discussing patient identified problems/goals with staff: Yes. Medical problems stabilized or resolved: Yes. Denies suicidal/homicidal ideation: Yes. Issues/concerns per patient self-inventory: No. Other: none  New problem(s) identified: No, Describe:  none  New Short Term/Long Term Goal(s): medication stabilization, elimination of SI thoughts, development of comprehensive mental wellness plan.   Patient Goals:  "Not have suicidal thoughts, it's been going on for months. Lower depression and anxiety"   Discharge Plan or Barriers: Patient recently admitted. CSW will continue to follow and assess for appropriate referrals and possible discharge planning.    Reason for Continuation of Hospitalization: Anxiety Depression Medication stabilization Other;  describe mood stabilization, discharge planning  Estimated Length of Stay: 3-5 days  Last 3 [place] Suicide Severity Risk Score: Flowsheet Row [facility] (Current) [facility]2023 in [facility] ED to Hosp-Admission (Discharged) from 04/12/2023 in [facility]  C-SSRS RISK CATEGORY Moderate Risk Moderate Risk High Risk       Last PHQ 2/9 Scores:     No data to display          Scribe for Treatment Team: Olivine Hiers N Cloe Sockwell, LCSW 06/05/2023 5:41 PM

## 2023-06-05 NOTE — Progress Notes (Signed)
   06/05/23 0525  15 Minute Checks  Location Bedroom  Visual Appearance Calm  Behavior Sleeping  Sleep (Behavioral Health Patients Only)  Calculate sleep? (Click Yes once per 24 hr at 0600 safety check) Yes  Documented sleep last 24 hours 6.75

## 2023-06-05 NOTE — Progress Notes (Signed)
   06/05/23 0800  Psych Admission Type (Psych Patients Only)  Admission Status Voluntary  Psychosocial Assessment  Patient Complaints Depression  Eye Contact Brief  Facial Expression Flat  Affect Appropriate to circumstance  Speech Logical/coherent  Interaction Assertive  Motor Activity Slow  Appearance/Hygiene Unremarkable  Behavior Characteristics Appropriate to situation  Mood Depressed  Thought Process  Coherency WDL  Content WDL  Delusions None reported or observed  Perception WDL  Hallucination None reported or observed  Judgment WDL  Confusion None  Danger to Self  Current suicidal ideation? Denies  Self-Injurious Behavior No self-injurious ideation or behavior indicators observed or expressed   Agreement Not to Harm Self Yes  Description of Agreement Verbal  Danger to Others  Danger to Others None reported or observed

## 2023-06-05 NOTE — Group Note (Signed)
 Recreation Therapy Group Note   Group Topic:Leisure Education  Group Date: 06/05/2023 Start Time: 0932 End Time: 1018 Facilitators: Asiel Chrostowski-McCall, LRT,CTRS Location: [address] Dayroom   Group Topic: Leisure Education   Goal Area(s) Addresses:  Patient will successfully identify positive leisure and recreation activities.  Patient will acknowledge benefits of participation in healthy leisure activities post discharge.  Patient will actively work with peers toward a shared goal.   Behavioral Response:    Intervention: Competitive Group Game   Activity: Guess the [employer]. The game was broken up into 6 categories (Pop, [place], [place], Hip-Hop, R&B and Dance). The first team would use the spinner to land one of the music categories. Which ever category the spinner landed on, that group would read the lyrics and opposing team had to fill in the blank. If they answered correctly, they kept the card. The team with the most cards at the end, wins the game. Post-activity discussion reviewed benefits of positive recreation outlets: reducing stress, improving coping mechanisms, increasing self-esteem, and building larger support systems.   Education:  Teacher, English as a foreign language, Stress Management, Discharge Planning  Education Outcome: Acknowledges education/In group clarification offered/Needs additional education   Affect/Mood: N/A   Participation Level: Did not attend    Clinical Observations/Individualized Feedback:     Plan: Continue to engage patient in RT group sessions 2-3x/week.   Abbey Veith-McCall, LRT,CTRS 06/05/2023 1:44 PM

## 2023-06-06 DIAGNOSIS — F319 Bipolar disorder, unspecified: Secondary | ICD-10-CM | POA: Diagnosis not present

## 2023-06-06 NOTE — Group Note (Signed)
 LCSW Group Therapy Note   Group Date: 06/06/2023 Start Time: 1100 End Time: 1200  Participation:  did not attend  Type of Therapy:  Group Therapy  Topic:  "Money Matters: Ecologist, Confidence and Peace of Mind"  Objective: To help participants understand the impact of financial stability on well-being through the lens of Maslow's Hierarchy of Needs and develop practical strategies for budgeting, saving, and debt repayment.  Goals: Increase awareness of spending habits and financial priorities, recognizing how money supports basic needs, security, and relationships. Develop simple budgeting and saving strategies to enhance stability and peace of mind.  Reduce financial stress by creating a realistic debt repayment plan, supporting long-term confidence and well-being.  Summary:  Participants explored how financial stability connects to basic needs, relationships, and self-esteem using Maslow's Hierarchy. They discussed budgeting, saving, and debt repayment strategies, identifying small, manageable changes. Through interactive discussion and self-reflection, they gained insight into their financial habits and created personal action steps for improvement.  Therapeutic Modalities Used: Elements of Cognitive Behavioral Therapy (CBT) - Addressing financial stress and thought patterns. Psychoeducation - Engineer, agricultural. Elements of Motivational Interviewing (MI) - Encouraging realistic, achievable changes. Group Support - Reducing shame and stress through shared experiences.   Boen Sterbenz O Catalina Salasar, LCSWA 06/06/2023  4:34 PM

## 2023-06-06 NOTE — Plan of Care (Signed)
  Problem: Education: Goal: Emotional status will improve Outcome: Progressing Goal: Verbalization of understanding the information provided will improve Outcome: Progressing   Problem: Coping: Goal: Ability to verbalize frustrations and anger appropriately will improve Outcome: Progressing   Problem: Physical Regulation: Goal: Ability to maintain clinical measurements within normal limits will improve Outcome: Progressing

## 2023-06-06 NOTE — Progress Notes (Signed)
 [facility] MD Progress Note  06/06/2023 3:51 PM [name]  MRN:  [id number]  Subjective:   63 year old Caucasian male, single, homeless, unemployed. Background history of MDD recurrent, anxiety disorder, alcohol use disorder in remission and multiple medical comorbidities. Presented with worsening depression associated with suicidal thoughts. Depression driven by multiple psychosocial stressors. Significant other developed dementia and currently in long-term care. Patient does not have any support structures in the community.  Chart reviewed today.  Patient discussed at multidisciplinary team meeting.  Vital signs reviewed without critical values.  Patient compliant with psychotropic medications.  As needed of Tylenol  x 1 required for mild pain.  Today's assessment notes: On assessment today, patient seen and examined lying down on his bed.  Reports his mood is still depressed, and rates depression as #10/10 with 10 being high severity.  Chart reviewed with findings shared with the treatment team and consult with attending psychiatrist.  He continues on antidepressant of Prozac  20 mg p.o. daily for depression and anxiety and Latuda  20 mg p.o. daily with supper for his mood.  Per chart review, patient depression seems to be chronic.  Speech is clear with normal volume and pattern.  Able to participate in answering assessment questions appropriately. Nursing staff reports that patient slept for 6.75 hours, although, he continues to report poor sleep at nighttime.  No challenging behavior.  He continues to endorse passive suicidal ideation without intent, plan or means to carry it out.  Patient continues to endorse a feeling of hopelessness and worthlessness.  Not sure if patient is malingering at this time, possibly due to homelessness.  States that he has had suicidal thoughts for years.  No current intent on harming himself. We will monitor therapeutic effectiveness of psychotropic medications and  patient's safety.  Principal Problem: Bipolar 1 disorder, depressed (HCC) Diagnosis: Principal Problem:   Bipolar 1 disorder, depressed (HCC)  Total Time spent with patient: 45 minutes  Past Psychiatric History:  See H&P.  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 echo 5/17: EF 60-65%, no RWMA, LV dias fxn nl, LA mildly dilated, RV sys fxn nl, PASP nl, moderate sized circumferential pericardial effusion was identified, 2.12 cm around the LV free wall, <1 cm around the RV free wall. Features were not c/w tamponade physiology   PTSD (post-traumatic stress disorder)    Witnessed brother's suicide.   Restless leg syndrome    Seizures (HCC)    Syncope     Past Surgical History:  Procedure Laterality Date   APPENDECTOMY     CARDIOVERSION N/A 04/17/2023   Procedure: CARDIOVERSION;  Surgeon: Constancia Delton, MD;  Location: [facility] ORS;  Service: Cardiovascular;  Laterality: N/A;   CYSTOSCOPY WITH URETEROSCOPY AND STENT PLACEMENT     ESOPHAGOGASTRODUODENOSCOPY N/A 01/11/2016   Procedure: ESOPHAGOGASTRODUODENOSCOPY (EGD);  Surgeon: Baldo Bonds, MD;  Location: [facility] ENDOSCOPY;  Service: Endoscopy;  Laterality: N/A;   ESOPHAGOGASTRODUODENOSCOPY N/A 04/09/2020   Procedure: ESOPHAGOGASTRODUODENOSCOPY (EGD);  Surgeon: Luke Salaam, MD;  Location: [facility] ENDOSCOPY;  Service: Gastroenterology;  Laterality: N/A;  INCISION AND DRAINAGE ABSCESS Left 01/02/2018   Procedure: INCISION AND DRAINAGE LEFT KNEE;  Surgeon: Marlynn Singer, MD;  Location: [facility] ORS;  Service: Orthopedics;  Laterality: Left;   JOINT REPLACEMENT Right    TKR   KNEE  ARTHROSCOPY Right 06/25/2014   Procedure: ARTHROSCOPY KNEE;  Surgeon: Marlynn Singer, MD;  Location: [facility] ORS;  Service: Orthopedics;  Laterality: Right;  partial arthroscopic medial menisectomy   LAPAROSCOPIC APPENDECTOMY N/A 06/02/2021   Procedure: APPENDECTOMY LAPAROSCOPIC;  Surgeon: Flynn Hylan, MD;  Location: [facility] ORS;  Service: General;  Laterality: N/A;   TEE WITHOUT CARDIOVERSION N/A 04/17/2023   Procedure: ECHOCARDIOGRAM, TRANSESOPHAGEAL;  Surgeon: Constancia Delton, MD;  Location: [facility] ORS;  Service: Cardiovascular;  Laterality: N/A;   TOTAL KNEE ARTHROPLASTY Right 04/22/2015   Procedure: TOTAL KNEE ARTHROPLASTY;  Surgeon: Marlynn Singer, MD;  Location: [facility] ORS;  Service: Orthopedics;  Laterality: Right;   TOTAL KNEE ARTHROPLASTY Left 10/30/2017   Procedure: TOTAL KNEE ARTHROPLASTY;  Surgeon: Marlynn Singer, MD;  Location: [facility] ORS;  Service: Orthopedics;  Laterality: Left;   TOTAL KNEE REVISION Left 01/02/2018   Procedure: poly exchange of tibia and patella left knee;  Surgeon: Marlynn Singer, MD;  Location: [facility] ORS;  Service: Orthopedics;  Laterality: Left;   UMBILICAL HERNIA REPAIR  06/02/2021   Procedure: HERNIA REPAIR UMBILICAL ADULT;  Surgeon: Flynn Hylan, MD;  Location: [facility] ORS;  Service: General;;   Family History:  Family History  Problem Relation Age of Onset   CVA Mother        deceased at age 45   Depression Brother        Died by suicide at age 54   Family Psychiatric  History:  See H&P.  Social History:  Social History   Substance and Sexual Activity  Alcohol Use Not Currently   Comment: rare     Social History   Substance and Sexual Activity  Drug Use Not Currently   Types: Marijuana, Cocaine   Comment: last use January 2025, cocaine    Social History   Socioeconomic History   Marital status: Single    Spouse name: Not on file   Number of children: Not on file   Years of education: Not on file   Highest education level: Not on file   Occupational History   Not on file  Tobacco Use   Smoking status: Former    Current packs/day: 0.00    Average packs/day: 0.8 packs/day for 20.0 years (15.0 ttl pk-yrs)    Types: Cigarettes    Start date: 05/16/1964    Quit date: 05/16/1984    Years since quitting: 39.0   Smokeless tobacco: Never  Vaping Use   Vaping status: Never Used  Substance and Sexual Activity   Alcohol use: Not Currently    Comment: rare   Drug use: Not Currently    Types: Marijuana, Cocaine    Comment: last use January 2025, cocaine   Sexual activity: Not Currently  Other Topics Concern   Not on file  Social History Narrative   ** Merged History Encounter **       Social Drivers of Health   Financial Resource Strain: Not on file  Food Insecurity: No Food Insecurity (05/30/2023)   Hunger Vital Sign    Worried About Running Out of Food in the Last Year: Never true    Ran Out of Food in the Last Year: Never true  Transportation Needs: No Transportation Needs (05/30/2023)   PRAPARE - Transportation    Lack  of Transportation (Medical): No    Lack of Transportation (Non-Medical): No  Physical Activity: Not on file  Stress: Not on file  Social Connections: Socially Isolated (02/15/2023)   Social Connection and Isolation Panel [NHANES]    Frequency of Communication with Friends and Family: Once a week    Frequency of Social Gatherings with Friends and Family: Once a week    Attends Religious Services: Never    Database administrator or Organizations: No    Attends Engineer, structural: Never    Marital Status: Never married   Additional Social History:   Current Medications: Current Facility-Administered Medications  Medication Dose Route Frequency Provider Last Rate Last Admin   acetaminophen  (TYLENOL ) tablet 650 mg  650 mg Oral Q6H PRN Mills, Shnese E, NP   650 mg at 06/05/23 1717   alum & mag hydroxide-simeth (MAALOX/MYLANTA) 200-200-20 MG/5ML suspension 30 mL  30 mL Oral Q4H PRN Mills,  Shnese E, NP       amiodarone  (PACERONE ) tablet 200 mg  200 mg Oral Daily Mills, Shnese E, NP   200 mg at 06/06/23 0800   atorvastatin (LIPITOR) tablet 20 mg  20 mg Oral Daily [name], [name], NP   20 mg at 06/05/23 1720   haloperidol  (HALDOL ) tablet 5 mg  5 mg Oral TID PRN Doneen Fuelling, NP       And   diphenhydrAMINE  (BENADRYL ) capsule 50 mg  50 mg Oral TID PRN Doneen Fuelling, NP       haloperidol  lactate (HALDOL ) injection 5 mg  5 mg Intramuscular TID PRN Doneen Fuelling, NP       And   diphenhydrAMINE  (BENADRYL ) injection 50 mg  50 mg Intramuscular TID PRN Doneen Fuelling, NP       And   LORazepam  (ATIVAN ) injection 2 mg  2 mg Intramuscular TID PRN Mills, Shnese E, NP       haloperidol  lactate (HALDOL ) injection 10 mg  10 mg Intramuscular TID PRN Doneen Fuelling, NP       And   diphenhydrAMINE  (BENADRYL ) injection 50 mg  50 mg Intramuscular TID PRN Doneen Fuelling, NP       And   LORazepam  (ATIVAN ) injection 2 mg  2 mg Intramuscular TID PRN Mills, Shnese E, NP       divalproex  (DEPAKOTE ) DR tablet 750 mg  750 mg Oral BID Mills, Shnese E, NP   750 mg at 06/06/23 0801   doxepin  (SINEQUAN ) capsule 50 mg  50 mg Oral QHS Mills, Shnese E, NP   50 mg at 06/05/23 2110   escitalopram (LEXAPRO) tablet 20 mg  20 mg Oral QHS Anetra Czerwinski C, FNP   20 mg at 06/05/23 2111   feeding supplement (ENSURE ENLIVE / ENSURE PLUS) liquid 237 mL  237 mL Oral TID BM Mills, Shnese E, NP   237 mL at 06/05/23 1430   lurasidone  (LATUDA ) tablet 20 mg  20 mg Oral Q supper Mills, Shnese E, NP   20 mg at 06/05/23 1717   magnesium  hydroxide (MILK OF MAGNESIA) suspension 30 mL  30 mL Oral Daily PRN Mills, Shnese E, NP       melatonin tablet 5 mg  5 mg Oral QHS Mills, Shnese E, NP   5 mg at 05/30/23 2048   metoprolol  succinate (TOPROL -XL) 24 hr tablet 25 mg  25 mg Oral Daily Mills, Shnese E, NP   25 mg at 06/06/23 0801   multivitamin with  minerals tablet 1 tablet  1 tablet Oral Daily [name], NP   1 tablet at  06/06/23 0801   prazosin (MINIPRESS) capsule 2 mg  2 mg Oral QHS [name], [name], MD   2 mg at 06/05/23 2110   tamsulosin  (FLOMAX ) capsule 0.4 mg  0.4 mg Oral QPC supper Mills, Shnese E, NP   0.4 mg at 06/05/23 1717   Lab Results: No results found for this or any previous visit (from the past 48 hours).  Blood Alcohol level:  Lab Results  Component Value Date   [facility] <15 05/29/2023   ETH <10 04/12/2023   Metabolic Disorder Labs: Lab Results  Component Value Date   HGBA1C 4.9 06/01/2023   MPG 93.93 06/01/2023   MPG 111.15 12/29/2022   No results found for: "PROLACTIN" Lab Results  Component Value Date   CHOL 246 (H) 06/01/2023   TRIG 129 06/01/2023   HDL 37 (L) 06/01/2023   CHOLHDL 6.6 06/01/2023   VLDL 26 06/01/2023   LDLCALC 183 (H) 06/01/2023   LDLCALC 146 (H) 12/29/2022    Physical Findings: AIMS:  , ,  ,  ,    CIWA:    COWS:     Musculoskeletal: Strength & Muscle Tone: within normal limits Gait & Station: broad based Patient leans: N/A  Psychiatric Specialty Exam:  Presentation  General Appearance:  Casually dressed, slim build, in bed this morning, not in any distress.  No EPS.  Eye Contact: Good.  Speech: Spontaneous.  Soft spoken.  Mood and Affect  Mood: Depressed.  Affect: Blunted and mood congruent.  Thought Process  Thought Processes: Linear and goal directed.  Descriptions of Associations:Intact  Orientation:Full (Time, Place and Person)  Thought Content: Negative ruminations about things that happened in the past.  Feelings of hopelessness or worthlessness.  No current suicidal thoughts.  No homicidal thoughts.  No thoughts of violence.  No delusional theme.  No obsessions.  Hallucinations: No hallucination in any modality.  Sensorium  Memory: Good.  Judgment: Good.  Insight: Good  Executive Functions  Concentration: Good.  Attention Span: Good.  Recall: Good.  Fund of  Knowledge: Good.  Language: Good  Psychomotor Activity  Mild psychomotor retardation.  Physical Exam: Physical Exam Vitals and nursing note reviewed.  Constitutional:      General: He is not in acute distress.    Appearance: He is normal weight. He is not ill-appearing.  HENT:     Head: Normocephalic.     Right Ear: External ear normal.     Left Ear: External ear normal.     Nose: Nose normal.     Mouth/Throat:     Mouth: Mucous membranes are moist.     Pharynx: Oropharynx is clear.  Eyes:     Extraocular Movements: Extraocular movements intact.  Cardiovascular:     Rate and Rhythm: Normal rate.     Pulses: Normal pulses.  Pulmonary:     Effort: Pulmonary effort is normal.  Abdominal:     Comments: Deferred  Genitourinary:    Comments: Deferred Musculoskeletal:        General: Normal range of motion.     Cervical back: Normal range of motion.  Skin:    General: Skin is warm.  Neurological:     General: No focal deficit present.     Mental Status: He is alert and oriented to person, place, and time.  Psychiatric:        Mood and Affect: Mood normal.  Behavior: Behavior normal.   Review of Systems  Constitutional:  Negative for chills and fever.  HENT:  Negative for sore throat.   Eyes:  Negative for blurred vision.  Respiratory:  Negative for cough, sputum production, shortness of breath and wheezing.   Cardiovascular:  Negative for chest pain and palpitations.  Gastrointestinal:  Negative for heartburn and nausea.  Genitourinary:  Negative for dysuria and urgency.  Musculoskeletal:  Negative for falls.  Skin:  Negative for itching and rash.  Neurological:  Negative for dizziness, seizures and headaches.  Endo/Heme/Allergies:        See allergy listing  Psychiatric/Behavioral:  Positive for depression and suicidal ideas. Negative for hallucinations and substance abuse. The patient is nervous/anxious. The patient does not have insomnia.    Blood  pressure 104/73, pulse 72, temperature 97.7 F (36.5 C), temperature source Oral, resp. rate 16, height 5\' 9"  (1.753 m), weight 77 kg, SpO2 98%. Body mass index is 25.07 kg/m.  Treatment Plan Summary: Patient has residual symptoms of depression.  He was recently started on an SSRI.  He is tolerating his medicine without any adverse effects.  Prazosin was added yesterday.  We have agreed to optimize the dose.  We will evaluate him further.  1.  Continue escitalopram 20 mg p.o. at bedtime. 2.  Prazosin 2 mg at bedtime to target nightmares. 3.  Continue Depakote  750 mg twice daily. 4.  Continue lurasidone  20 mg with dinner 5.  Continue Doxepin  50 mg at bedtime 6.  Encourage unit groups and therapeutic activities. 7.  Continue to monitor mood behavior and interaction with others. 8.  Social worker will coordinate discharge and aftercare planning.   Laurence Pons, FNP 06/06/2023, 3:51 PM Patient ID: [name], male   DOB: [birth year], 63 y.o.   MRN: [id number] Patient ID: [name] Colon Chalfin, male   DOB: [birth year], 63 y.o.   MRN: [id number]

## 2023-06-06 NOTE — Progress Notes (Signed)
   06/05/23 2155  Psych Admission Type (Psych Patients Only)  Admission Status Voluntary  Psychosocial Assessment  Patient Complaints Anxiety;Insomnia;Depression  Eye Contact Fair  Facial Expression Flat;Anxious  Affect Appropriate to circumstance  Speech Logical/coherent  Interaction Assertive  Motor Activity Slow  Appearance/Hygiene Unremarkable  Behavior Characteristics Appropriate to situation  Mood Depressed;Anxious  Thought Process  Coherency WDL  Content WDL  Delusions None reported or observed  Perception WDL  Hallucination None reported or observed  Judgment WDL  Confusion None  Danger to Self  Current suicidal ideation? Denies  Self-Injurious Behavior No self-injurious ideation or behavior indicators observed or expressed   Agreement Not to Harm Self Yes  Description of Agreement verbal  Danger to Others  Danger to Others None reported or observed

## 2023-06-06 NOTE — Group Note (Signed)
 Recreation Therapy Group Note   Group Topic:Animal Assisted Therapy   Group Date: 06/06/2023 Start Time: 4098 End Time: 1030 Facilitators: Cornelio Parkerson-McCall, LRT,CTRS Location: [address] Dayroom   Animal-Assisted Activity (AAA) Program Checklist/Progress Notes Patient Eligibility Criteria Checklist & Daily Group note for Rec Tx Intervention  AAA/T Program Assumption of Risk Form signed by Patient/ or Parent Legal Guardian Yes  Patient understands his/her participation is voluntary Yes  Behavioral Response:    Education: Charity fundraiser, Appropriate Animal Interaction   Education Outcome: Acknowledges education.    Affect/Mood: N/A   Participation Level: Did not attend    Clinical Observations/Individualized Feedback:     Plan: Continue to engage patient in RT group sessions 2-3x/week.   Raylee Adamec-McCall, LRT,CTRS 06/06/2023 11:40 AM

## 2023-06-06 NOTE — Group Note (Signed)
 Date:  06/06/2023 Time:  9:01 PM  Group Topic/Focus:  Wrap-Up Group:   The focus of this group is to help patients review their daily goal of treatment and discuss progress on daily workbooks.    Participation Level:  Active  Participation Quality:  Appropriate and Sharing  Affect:  Appropriate  Cognitive:  Appropriate  Insight: Appropriate and Limited  Engagement in Group:  Engaged  Modes of Intervention:  Activity and Socialization  Additional Comments:  Patients used wrap up group sheets. Patient rated his day a 10 out of 10 (10 being the best and 1 being the worst). Patient goal for today; "get better" and patient did achieve goal. Coping skills the patient finds most helpful, "drawing and breathing". Something the patient likes about himself, "artistic".    Dillard Frame 06/06/2023, 9:01 PM

## 2023-06-06 NOTE — Progress Notes (Signed)
   06/06/23 0800  Psych Admission Type (Psych Patients Only)  Admission Status Voluntary  Psychosocial Assessment  Patient Complaints Anxiety;Depression;Insomnia  Eye Contact Fair  Facial Expression Flat;Anxious  Affect Appropriate to circumstance  Speech Logical/coherent  Interaction Assertive  Motor Activity Slow  Appearance/Hygiene Unremarkable  Behavior Characteristics Appropriate to situation  Mood Depressed;Anxious  Thought Process  Coherency WDL  Content WDL  Delusions None reported or observed  Perception WDL  Hallucination None reported or observed  Judgment WDL  Confusion None  Danger to Self  Current suicidal ideation? Denies  Self-Injurious Behavior No self-injurious ideation or behavior indicators observed or expressed   Agreement Not to Harm Self Yes  Description of Agreement verbal  Danger to Others  Danger to Others None reported or observed

## 2023-06-06 NOTE — Group Note (Signed)
 Date:  06/06/2023 Time:  9:13 AM  Group Topic/Focus:  Goals Group:   The focus of this group is to help patients establish daily goals to achieve during treatment and discuss how the patient can incorporate goal setting into their daily lives to aide in recovery. Orientation:   The focus of this group is to educate the patient on the purpose and policies of crisis stabilization and provide a format to answer questions about their admission.  The group details unit policies and expectations of patients while admitted.    Participation Level:  Did Not Attend

## 2023-06-06 NOTE — Plan of Care (Signed)
  Problem: Education: Goal: Knowledge of [place] [facility] Education information/materials will improve Outcome: Progressing   Problem: Activity: Goal: Interest or engagement in activities will improve Outcome: Progressing   Problem: Coping: Goal: Ability to verbalize frustrations and anger appropriately will improve Outcome: Progressing   Problem: Safety: Goal: Periods of time without injury will increase Outcome: Progressing   

## 2023-06-07 DIAGNOSIS — F319 Bipolar disorder, unspecified: Secondary | ICD-10-CM | POA: Diagnosis not present

## 2023-06-07 MED ORDER — HYDROXYZINE HCL 25 MG PO TABS
25.0000 mg | ORAL_TABLET | Freq: Three times a day (TID) | ORAL | Status: DC | PRN
  Administered 2023-06-07 – 2023-06-08 (×3): 25 mg via ORAL
  Filled 2023-06-07 (×3): qty 1

## 2023-06-07 NOTE — Group Note (Signed)
 Date:  06/07/2023 Time:  9:45 PM  Group Topic/Focus:  Wrap-Up Group:   The focus of this group is to help patients review their daily goal of treatment and discuss progress on daily workbooks.    Participation Level:  Active  Participation Quality:  Appropriate and Sharing  Affect:  Appropriate  Cognitive:  Appropriate  Insight: Appropriate and Improving  Engagement in Group:  Engaged  Modes of Intervention:  Activity and Socialization  Additional Comments:  Patient attend wrap up group and actively participated. Patient shared that he had a "rough day" and feels its been like that for the past 6-7 days being here. However, patient shared that his roommate has given him "motivation" to open up more and share. Patient talked about relationship with spouse. Patient also shared that he feels that he is getting better.  Dillard Frame 06/07/2023, 9:45 PM

## 2023-06-07 NOTE — Group Note (Unsigned)
 Therapy Group Note  Group Topic:Other  Group Date: 06/07/2023 Start Time: 1430 End Time: 1518 Facilitators: Filip Luten G, OT    Group OT session titled "Media Detox Challenge" was conducted with approximately 20 adolescent patients in the inpatient behavioral health unit. The group focused on exploring the impact of media/screen use on emotional regulation, occupational balance, and routine development. Patients were provided with structured handouts and guided through a step-by-step process that included individual reflection, small group planning, and large group discussion. Tasks included identifying current screen time habits, emotional effects of media use, and collaboratively designing a 24-hour "media detox" plan with non-screen-based alternatives.      Participation Level: {OT B[facility] Participation RUEAV:40981}   Participation Quality: {OT BHH Participation Quality:26268}   Behavior: {BHH OT Group Behavior:26269}   Speech/Thought Process: {BHH OT Speech/Thought Process:26270}   Affect/Mood: {OT BHH Affect/Mood:26271}   Insight: {OT BHH Insight:26272}   Judgement: {OT BHH Judgement:26272}   Individualization: *** was *** in their participation of group discussion/activity. *** identified  Modes of Intervention: {BHH MODES OF INTERVENTION:26273}  Patient Response to Interventions:  {BHH OT Patient Response to Interventions:26274}   Plan: Continue to engage patient in OT groups 2 - 3x/week.  06/07/2023  Lynnda Sas, OT

## 2023-06-07 NOTE — Progress Notes (Signed)
 [facility] MD Progress Note  06/07/2023 7:08 PM [name]  MRN:  [id number]  Subjective:   63 year old Caucasian male, single, homeless, unemployed. Background history of MDD recurrent, anxiety disorder, alcohol use disorder in remission and multiple medical comorbidities. Presented with worsening depression associated with suicidal thoughts. Depression driven by multiple psychosocial stressors. Significant other developed dementia and currently in long-term care. Patient does not have any support structures in the community.  Chart reviewed today.  Patient discussed at multidisciplinary team meeting.  Vital signs reviewed without critical values. Patient compliant with psychotropic medications.  As needed of Tylenol  x 1 required for mild pain.  Today's assessment notes: Patient presents very up-beat today. He presents with a good affect, making a good eye contact. He reports, "I slept better last night than I have a ever done since being here. I also did have a bit night terror, but was able to get a better sleep last nigh. My mood is fair. Am I still feeling suicidal? yes, but will ever kill myself? No.  All I need today is some hydroxyzine  order so I can get something for anxiety if I need it. I plan to go see the woman that I loved for 34 years & she loved me as much. She is currently in a nursing home. My life fell apart after she was transferred to the nursing home after she developed dementia" [name] continues to endorse passive SI without any plans or intent to hurt himself. He denies any HI, AVH, delusional thoughts or paranoia. He does not appear to be responding to any internal stimuli. There are no changes made on his current plan of care. Continue as already in progress.  Principal Problem: Bipolar 1 disorder, depressed (HCC) Diagnosis: Principal Problem:   Bipolar 1 disorder, depressed (HCC)  Total Time spent with patient: 45 minutes  Past Psychiatric History:  See H&P.  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 echo 5/17: EF 60-65%, no RWMA, LV dias fxn nl, LA mildly dilated, RV sys fxn nl, PASP nl, moderate sized circumferential pericardial effusion was identified, 2.12 cm around the LV free wall, <1 cm around the RV free wall. Features were not c/w tamponade physiology   PTSD (post-traumatic stress disorder)    Witnessed brother's suicide.   Restless leg syndrome    Seizures (HCC)    Syncope     Past Surgical History:  Procedure Laterality Date   APPENDECTOMY     CARDIOVERSION N/A 04/17/2023   Procedure: CARDIOVERSION;  Surgeon: Constancia Delton, MD;  Location: [facility] ORS;  Service: Cardiovascular;  Laterality: N/A;   CYSTOSCOPY WITH URETEROSCOPY AND STENT PLACEMENT     ESOPHAGOGASTRODUODENOSCOPY N/A 01/11/2016   Procedure: ESOPHAGOGASTRODUODENOSCOPY (EGD);  Surgeon: Baldo Bonds, MD;  Location: [facility] ENDOSCOPY;  Service: Endoscopy;  Laterality: N/A;   ESOPHAGOGASTRODUODENOSCOPY N/A 04/09/2020   Procedure: ESOPHAGOGASTRODUODENOSCOPY (EGD);  Surgeon: Luke Salaam, MD;  Location: [facility] ENDOSCOPY;  Service: Gastroenterology;  Laterality: N/A;   INCISION AND DRAINAGE ABSCESS Left 01/02/2018   Procedure: INCISION AND DRAINAGE  LEFT KNEE;  Surgeon: Marlynn Singer, MD;  Location: [facility] ORS;  Service: Orthopedics;  Laterality: Left;   JOINT REPLACEMENT Right    TKR   KNEE ARTHROSCOPY Right 06/25/2014   Procedure: ARTHROSCOPY KNEE;  Surgeon: Marlynn Singer, MD;  Location: [facility] ORS;  Service: Orthopedics;  Laterality: Right;  partial arthroscopic medial menisectomy   LAPAROSCOPIC APPENDECTOMY N/A 06/02/2021    Procedure: APPENDECTOMY LAPAROSCOPIC;  Surgeon: Flynn Hylan, MD;  Location: [facility] ORS;  Service: General;  Laterality: N/A;   TEE WITHOUT CARDIOVERSION N/A 04/17/2023   Procedure: ECHOCARDIOGRAM, TRANSESOPHAGEAL;  Surgeon: Constancia Delton, MD;  Location: [facility] ORS;  Service: Cardiovascular;  Laterality: N/A;   TOTAL KNEE ARTHROPLASTY Right 04/22/2015   Procedure: TOTAL KNEE ARTHROPLASTY;  Surgeon: Marlynn Singer, MD;  Location: [facility] ORS;  Service: Orthopedics;  Laterality: Right;   TOTAL KNEE ARTHROPLASTY Left 10/30/2017   Procedure: TOTAL KNEE ARTHROPLASTY;  Surgeon: Marlynn Singer, MD;  Location: [facility] ORS;  Service: Orthopedics;  Laterality: Left;   TOTAL KNEE REVISION Left 01/02/2018   Procedure: poly exchange of tibia and patella left knee;  Surgeon: Marlynn Singer, MD;  Location: [facility] ORS;  Service: Orthopedics;  Laterality: Left;   UMBILICAL HERNIA REPAIR  06/02/2021   Procedure: HERNIA REPAIR UMBILICAL ADULT;  Surgeon: Flynn Hylan, MD;  Location: [facility] ORS;  Service: General;;   Family History:  Family History  Problem Relation Age of Onset   CVA Mother        deceased at age 21   Depression Brother        Died by suicide at age 35   Family Psychiatric  History:  See H&P.  Social History:  Social History   Substance and Sexual Activity  Alcohol Use Not Currently   Comment: rare     Social History   Substance and Sexual Activity  Drug Use Not Currently   Types: Marijuana, Cocaine   Comment: last use January 2025, cocaine    Social History   Socioeconomic History   Marital status: Single    Spouse name: Not on file   Number of children: Not on file   Years of education: Not on file   Highest education level: Not on file  Occupational History   Not on file  Tobacco Use   Smoking status: Former    Current packs/day: 0.00    Average packs/day: 0.8 packs/day for 20.0 years (15.0 ttl pk-yrs)    Types: Cigarettes    Start date: 05/16/1964    Quit date:  05/16/1984    Years since quitting: 39.0   Smokeless tobacco: Never  Vaping Use   Vaping status: Never Used  Substance and Sexual Activity   Alcohol use: Not Currently    Comment: rare   Drug use: Not Currently    Types: Marijuana, Cocaine    Comment: last use January 2025, cocaine   Sexual activity: Not Currently  Other Topics Concern   Not on file  Social History Narrative   ** Merged History Encounter **       Social Drivers of Health   Financial Resource Strain: Not on file  Food Insecurity: No Food Insecurity (05/30/2023)   Hunger Vital Sign    Worried About Running Out of Food in the Last Year: Never true    Ran Out of Food in the Last Year: Never true  Transportation Needs: No Transportation Needs (05/30/2023)   PRAPARE - Administrator, Civil Service (Medical): No    Lack of Transportation (Non-Medical): No  Physical Activity: Not on file  Stress: Not on file  Social Connections: Socially Isolated (02/15/2023)   Social Connection and Isolation Panel [NHANES]    Frequency of Communication with Friends and Family: Once a week    Frequency of Social Gatherings with Friends and Family: Once a week    Attends Religious Services: Never    Database administrator or Organizations: No    Attends Engineer, structural: Never    Marital Status: Never married   Additional Social History:   Current Medications: Current Facility-Administered Medications  Medication Dose Route Frequency Provider Last Rate Last Admin   acetaminophen  (TYLENOL ) tablet 650 mg  650 mg Oral Q6H PRN Mills, Shnese E, NP   650 mg at 06/07/23 1416   alum & mag hydroxide-simeth (MAALOX/MYLANTA) 200-200-20 MG/5ML suspension 30 mL  30 mL Oral Q4H PRN Mills, Shnese E, NP       amiodarone  (PACERONE ) tablet 200 mg  200 mg Oral Daily Mills, Shnese E, NP   200 mg at 06/07/23 0804   atorvastatin (LIPITOR) tablet 20 mg  20 mg Oral Daily Kyrstan Gotwalt I, NP   20 mg at 06/07/23 1631   haloperidol   (HALDOL ) tablet 5 mg  5 mg Oral TID PRN Doneen Fuelling, NP       And   diphenhydrAMINE  (BENADRYL ) capsule 50 mg  50 mg Oral TID PRN Mills, Shnese E, NP       haloperidol  lactate (HALDOL ) injection 5 mg  5 mg Intramuscular TID PRN Doneen Fuelling, NP       And   diphenhydrAMINE  (BENADRYL ) injection 50 mg  50 mg Intramuscular TID PRN Doneen Fuelling, NP       And   LORazepam  (ATIVAN ) injection 2 mg  2 mg Intramuscular TID PRN Mills, Shnese E, NP       haloperidol  lactate (HALDOL ) injection 10 mg  10 mg Intramuscular TID PRN Doneen Fuelling, NP       And   diphenhydrAMINE  (BENADRYL ) injection 50 mg  50 mg Intramuscular TID PRN [name], NP       And   LORazepam  (ATIVAN ) injection 2 mg  2 mg Intramuscular TID PRN Mills, Shnese E, NP       divalproex  (DEPAKOTE ) DR tablet 750 mg  750 mg Oral BID Mills, Shnese E, NP   750 mg at 06/07/23 1630   doxepin  (SINEQUAN ) capsule 50 mg  50 mg Oral QHS Mills, Shnese E, NP   50 mg at 06/06/23 2117   escitalopram (LEXAPRO) tablet 20 mg  20 mg Oral QHS [name], [name], FNP   20 mg at 06/06/23 2117   feeding supplement (ENSURE ENLIVE / ENSURE PLUS) liquid 237 mL  237 mL Oral TID BM Mills, Shnese E, NP   237 mL at 06/07/23 1414   hydrOXYzine  (ATARAX ) tablet 25 mg  25 mg Oral TID PRN Asuncion Layer I, NP   25 mg at 06/07/23 1428   lurasidone  (LATUDA ) tablet 20 mg  20 mg Oral Q supper Mills, Shnese E, NP   20 mg at 06/07/23 1631   magnesium  hydroxide (MILK OF MAGNESIA) suspension 30 mL  30 mL Oral Daily PRN Mills, Shnese E, NP       melatonin tablet 5 mg  5 mg Oral QHS [name], NP   5 mg at 06/06/23 2117   metoprolol  succinate (TOPROL -XL) 24 hr tablet 25 mg  25 mg Oral Daily Mills, Shnese E, NP  25 mg at 06/07/23 0804   multivitamin with minerals tablet 1 tablet  1 tablet Oral Daily [name], NP   1 tablet at 06/07/23 1610   prazosin (MINIPRESS) capsule 2 mg  2 mg Oral QHS [name], [name], MD   2 mg at 06/06/23 2116   tamsulosin  (FLOMAX )  capsule 0.4 mg  0.4 mg Oral QPC supper Mills, Shnese E, NP   0.4 mg at 06/06/23 1812   Lab Results: No results found for this or any previous visit (from the past 48 hours).  Blood Alcohol level:  Lab Results  Component Value Date   [facility] <15 05/29/2023   ETH <10 04/12/2023   Metabolic Disorder Labs: Lab Results  Component Value Date   HGBA1C 4.9 06/01/2023   MPG 93.93 06/01/2023   MPG 111.15 12/29/2022   No results found for: "PROLACTIN" Lab Results  Component Value Date   CHOL 246 (H) 06/01/2023   TRIG 129 06/01/2023   HDL 37 (L) 06/01/2023   CHOLHDL 6.6 06/01/2023   VLDL 26 06/01/2023   LDLCALC 183 (H) 06/01/2023   LDLCALC 146 (H) 12/29/2022    Physical Findings: AIMS:  , ,  ,  ,    CIWA:    COWS:     Musculoskeletal: Strength & Muscle Tone: within normal limits Gait & Station: broad based Patient leans: N/A  Psychiatric Specialty Exam:  Presentation  General Appearance:  Casually dressed, slim build, in bed this morning, not in any distress.  No EPS.  Eye Contact: Good.  Speech: Spontaneous.  Soft spoken.  Mood and Affect  Mood: Depressed.  Affect: Blunted and mood congruent.  Thought Process  Thought Processes: Linear and goal directed.  Descriptions of Associations:Intact  Orientation:Full (Time, Place and Person)  Thought Content: Negative ruminations about things that happened in the past.  Feelings of hopelessness or worthlessness.  No current suicidal thoughts.  No homicidal thoughts.  No thoughts of violence.  No delusional theme.  No obsessions.  Hallucinations: No hallucination in any modality.  Sensorium  Memory: Good.  Judgment: Good.  Insight: Good  Executive Functions  Concentration: Good.  Attention Span: Good.  Recall: Good.  Fund of Knowledge: Good.  Language: Good  Psychomotor Activity  Mild psychomotor retardation.  Physical Exam: Physical Exam Vitals and nursing note reviewed.  Constitutional:       General: He is not in acute distress.    Appearance: He is normal weight. He is not ill-appearing.  HENT:     Head: Normocephalic.     Right Ear: External ear normal.     Left Ear: External ear normal.     Nose: Nose normal.     Mouth/Throat:     Mouth: Mucous membranes are moist.     Pharynx: Oropharynx is clear.  Eyes:     Extraocular Movements: Extraocular movements intact.  Cardiovascular:     Rate and Rhythm: Normal rate.     Pulses: Normal pulses.  Pulmonary:     Effort: Pulmonary effort is normal.  Abdominal:     Comments: Deferred  Genitourinary:    Comments: Deferred Musculoskeletal:        General: Normal range of motion.     Cervical back: Normal range of motion.  Skin:    General: Skin is warm.  Neurological:     General: No focal deficit present.     Mental Status: He is alert and oriented to person, place, and time.  Psychiatric:  Mood and Affect: Mood normal.        Behavior: Behavior normal.    Review of Systems  Constitutional:  Negative for chills and fever.  HENT:  Negative for sore throat.   Eyes:  Negative for blurred vision.  Respiratory:  Negative for cough, sputum production, shortness of breath and wheezing.   Cardiovascular:  Negative for chest pain and palpitations.  Gastrointestinal:  Negative for heartburn and nausea.  Genitourinary:  Negative for dysuria and urgency.  Musculoskeletal:  Negative for falls.  Skin:  Negative for itching and rash.  Neurological:  Negative for dizziness, seizures and headaches.  Endo/Heme/Allergies:        See allergy listing  Psychiatric/Behavioral:  Positive for depression and suicidal ideas. Negative for hallucinations and substance abuse. The patient is nervous/anxious. The patient does not have insomnia.    Blood pressure 110/64, pulse 70, temperature 97.7 F (36.5 C), temperature source Oral, resp. rate 16, height 5\' 9"  (1.753 m), weight 77 kg, SpO2 97%. Body mass index is 25.07  kg/m.  Treatment Plan Summary: Patient has residual symptoms of depression.  He was recently started on an SSRI.  He is tolerating his medicine without any adverse effects.  Prazosin was added yesterday.  We have agreed to optimize the dose.  We will evaluate him further.  1.  Continue escitalopram 20 mg p.o. at bedtime. 2.  Prazosin 2 mg at bedtime to target nightmares. 3.  Continue Depakote  750 mg twice daily. 4.  Continue lurasidone  20 mg with dinner 5.  Continue Doxepin  50 mg at bedtime 6.  Encourage unit groups and therapeutic activities. 7.  Continue to monitor mood behavior and interaction with others. 8.  Social worker will coordinate discharge and aftercare planning.   Asuncion Layer, NP, pmhnp, fnp-bc. 06/07/2023, 7:08 PM Patient ID: [name], male   DOB: [birth year], 63 y.o.   MRN: [id number] Patient ID: [name], male   DOB: 1960/02/13, 63 y.o.   MRN: [id number] Patient ID: [name], male   DOB: 24-Aug-1960, 63 y.o.   MRN: [id number]

## 2023-06-07 NOTE — Progress Notes (Signed)
   06/07/23 0745  Psych Admission Type (Psych Patients Only)  Admission Status Voluntary  Psychosocial Assessment  Patient Complaints Anxiety  Eye Contact Fair  Facial Expression Flat  Affect Appropriate to circumstance  Speech Logical/coherent  Interaction Assertive  Motor Activity Slow  Appearance/Hygiene Unremarkable  Behavior Characteristics Cooperative  Mood Depressed  Thought Process  Coherency WDL  Content WDL  Delusions None reported or observed  Perception WDL  Hallucination None reported or observed  Judgment WDL  Confusion None  Danger to Self  Current suicidal ideation? Denies  Description of Suicide Plan n  Self-Injurious Behavior No self-injurious ideation or behavior indicators observed or expressed   Agreement Not to Harm Self Yes  Description of Agreement verbal  Danger to Others  Danger to Others None reported or observed

## 2023-06-07 NOTE — Plan of Care (Signed)
  Problem: Education: Goal: Knowledge of [facility] Education information/materials will improve Outcome: Progressing Goal: Mental status will improve Outcome: Progressing   Problem: Activity: Goal: Interest or engagement in activities will improve Outcome: Progressing Goal: Sleeping patterns will improve Outcome: Progressing

## 2023-06-07 NOTE — Group Note (Signed)
 Recreation Therapy Group Note   Group Topic:Communication  Group Date: 06/07/2023 Start Time: 0945 End Time: 1009 Facilitators: Gaynor Genco-McCall, LRT,CTRS Location: [address] Dayroom   Group Topic: Communication, Problem Solving   Goal Area(s) Addresses:  Patient will effectively listen to complete activity.  Patient will identify communication skills used to make activity successful.  Patient will identify how skills used during activity can be used to reach post d/c goals.    Behavioral Response:   Intervention: Building surveyor Activity - Geometric pattern cards, pencils, blank paper    Activity: Geometric Drawings.  Three volunteers from the peer group will be shown an abstract picture with a particular arrangement of geometrical shapes.  Each round, one 'speaker' will describe the pattern, as accurately as possible without revealing the image to the group.  The remaining group members will listen and draw the picture to reflect how it is described to them. Patients with the role of 'listener' cannot ask clarifying questions but, may request that the speaker repeat a direction. Once the drawings are complete, the presenter will show the rest of the group the picture and compare how close each person came to drawing the picture. LRT will facilitate a post-activity discussion regarding effective communication and the importance of planning, listening, and asking for clarification in daily interactions with others.  Education: Environmental consultant, Active listening, Support systems, Discharge planning  Education Outcome: Acknowledges understanding/In group clarification offered/Needs additional education.    Affect/Mood: N/A   Participation Level: Did not attend    Clinical Observations/Individualized Feedback:      Plan: Continue to engage patient in RT group sessions 2-3x/week.   Shaniquia Brafford-McCall, LRT,CTRS 06/07/2023 12:28 PM

## 2023-06-08 DIAGNOSIS — F319 Bipolar disorder, unspecified: Secondary | ICD-10-CM | POA: Diagnosis not present

## 2023-06-08 MED ORDER — LURASIDONE HCL 20 MG PO TABS: 20.0000 mg | ORAL_TABLET | Freq: Every day | ORAL | 0 refills | Status: DC

## 2023-06-08 MED ORDER — PRAZOSIN HCL 2 MG PO CAPS: 2.0000 mg | ORAL_CAPSULE | Freq: Every day | ORAL | 0 refills | Status: DC

## 2023-06-08 MED ORDER — METOPROLOL SUCCINATE ER 25 MG PO TB24: 25.0000 mg | ORAL_TABLET | Freq: Every day | ORAL | 0 refills | Status: DC

## 2023-06-08 MED ORDER — TAMSULOSIN HCL 0.4 MG PO CAPS: 0.4000 mg | ORAL_CAPSULE | Freq: Every day | ORAL | 0 refills | Status: DC

## 2023-06-08 MED ORDER — HYDROXYZINE HCL 25 MG PO TABS: 25.0000 mg | ORAL_TABLET | Freq: Three times a day (TID) | ORAL | 0 refills | Status: DC | PRN

## 2023-06-08 MED ORDER — DOXEPIN HCL 50 MG PO CAPS: 50.0000 mg | ORAL_CAPSULE | Freq: Every day | ORAL | 0 refills | Status: DC

## 2023-06-08 MED ORDER — ESCITALOPRAM OXALATE 20 MG PO TABS: 20.0000 mg | ORAL_TABLET | Freq: Every day | ORAL | 0 refills | Status: DC

## 2023-06-08 MED ORDER — ATORVASTATIN CALCIUM 20 MG PO TABS: 20.0000 mg | ORAL_TABLET | Freq: Every day | ORAL | 0 refills | Status: DC

## 2023-06-08 MED ORDER — AMIODARONE HCL 200 MG PO TABS
200.0000 mg | ORAL_TABLET | Freq: Every day | ORAL | 0 refills | Status: DC
Start: 2023-06-08 — End: 2023-07-04

## 2023-06-08 MED ORDER — MELATONIN 5 MG PO TABS: 5.0000 mg | ORAL_TABLET | Freq: Every day | ORAL | 0 refills | Status: DC

## 2023-06-08 MED ORDER — DIVALPROEX SODIUM 250 MG PO DR TAB: 750.0000 mg | DELAYED_RELEASE_TABLET | Freq: Two times a day (BID) | ORAL | 0 refills | Status: DC

## 2023-06-08 NOTE — Plan of Care (Signed)
 Nurse discussed anxiety, depression and coping skills with patient.

## 2023-06-08 NOTE — [facility] Suicide Risk Assessment (Signed)
 Suicide Risk Assessment  Discharge Assessment    [facility] Discharge Suicide Risk Assessment   Principal Problem: Bipolar 1 disorder, depressed (HCC) Discharge Diagnoses: Principal Problem:   Bipolar 1 disorder, depressed (HCC)  Total Time spent with patient: Greater than 30 minutes.  Musculoskeletal: Strength & Muscle Tone: within normal limits Gait & Station: normal Patient leans: N/A  Psychiatric Specialty Exam  Presentation  General Appearance:  Appropriate for Environment; Casual; Fairly Groomed  Eye Contact: Good  Speech: Clear and Coherent; Normal Rate  Speech Volume: Normal  Handedness: Right   Mood and Affect  Mood: Euthymic  Duration of Depression Symptoms: Greater than two weeks  Affect: Congruent; Appropriate   Thought Process  Thought Processes: Coherent; Goal Directed  Descriptions of Associations:Intact  Orientation:Full (Time, Place and Person)  Thought Content:Logical  History of Schizophrenia/Schizoaffective disorder:No  Duration of Psychotic Symptoms:N/A  Hallucinations:Hallucinations: None  Ideas of Reference:None  Suicidal Thoughts:Suicidal Thoughts: Yes, Passive SI Passive Intent and/or Plan: Without Intent; Without Plan; Without Means to Carry Out; Without Access to Means (Patient upon this discharge suicide risks assessment has decleared that he will never kill, harm or hurt himself.)  Homicidal Thoughts:Homicidal Thoughts: No   Sensorium  Memory: Immediate Good; Recent Good; Remote Good  Judgment: Good  Insight: Good   Executive Functions  Concentration: Good  Attention Span: Good  Recall: Good  Fund of Knowledge: Fair  Language: Good   Psychomotor Activity  Psychomotor Activity:Psychomotor Activity: Normal   Assets  Assets: Communication Skills; Desire for Improvement; Social Support; Resilience   Sleep  Sleep:Sleep: Good Number of Hours of Sleep: 7.75   Physical Exam/Ros: See  H&P  Blood pressure 105/60, pulse 92, temperature (!) 97.5 F (36.4 C), temperature source Oral, resp. rate 16, height 5\' 9"  (1.753 m), weight 77 kg, SpO2 98%. Body mass index is 25.07 kg/m.  Mental Status Per Nursing Assessment::   On Admission:  Suicidal ideation indicated by patient  Demographic Factors:  Male, Caucasian, Low socioeconomic status, and Unemployed  Loss Factors: Loss of significant relationship  Historical Factors: NA  Risk Reduction Factors:   Living with another person, especially a relative, Positive social support, Positive therapeutic relationship, and Positive coping skills or problem solving skills  Continued Clinical Symptoms:  Depression:   Hopelessness Insomnia Previous Psychiatric Diagnoses and Treatments Medical Diagnoses and Treatments/Surgeries  Cognitive Features That Contribute To Risk:  Polarized thinking    Suicide Risk: Patient has hx of chronic suicidal ideations. However, he adamantly states that he will never commit suicide, hurt or harm himself in any way.  Minimal: No identifiable suicidal ideation.  Patients presenting with no risk factors but with morbid ruminations; may be classified as minimal risk based on the severity of the depressive symptoms   Follow-up Information     [facility]. Go on 06/14/2023.   Why: You have a hospital follow up appointment on 06/14/23 at 9:00 am. The appointment will be held in person. Following this appointment you will be scheduled for a clinical assessment, to obtain therapy and medication management services. Contact information: [address] [place] [address] [phone]                Plan Of Care/Follow-up recommendations:  Other:  See the follow-up recommendation above.  Asuncion Layer, NP, pmhnp, fnp-bc. 06/08/2023, 10:31 AM

## 2023-06-08 NOTE — Group Note (Signed)
 Date:  06/08/2023 Time:  9:23 AM  Group Topic/Focus:  Goals Group:   The focus of this group is to help patients establish daily goals to achieve during treatment and discuss how the patient can incorporate goal setting into their daily lives to aide in recovery.    Participation Level:  Did Not Attend  [name] 06/08/2023, 9:23 AM

## 2023-06-08 NOTE — Transportation (Signed)
 06/08/2023  [name] DOB: [birth year] MRN: [id number]   RIDER WAIVER AND RELEASE OF LIABILITY  For the purposes of helping with transportation needs, [facility] partners with outside transportation providers (taxi companies, [place], Catering manager.) to give [name] patients or other approved people the choice of on-demand rides [employer]") to our buildings for non-emergency visits.  By using [employer], I, the person signing this document, on behalf of myself and/or any legal minors (in my care using the [employer]), agree:  Science writer given to me are supplied by independent, outside transportation providers who do not work for, or have any affiliation with, [employer]. [place] is not a transportation company. [place] has no control over the quality or safety of the rides I get using [employer]. [place] has no control over whether any outside ride will happen on time or not. [place] gives no guarantee on the reliability, quality, safety, or availability on any rides, or that no mistakes will happen. I know and accept that traveling by vehicle (car, truck, SVU, Carloyn Chi, bus, taxi, etc.) has risks of serious injuries such as disability, being paralyzed, and death. I know and agree the risk of using [employer] is mine alone, and not [employer]. Transport Services are provided "as is" and as are available. The transportation providers are in charge for all inspections and care of the vehicles used to provide these rides. I agree not to take legal action against [place], its agents, employees, officers, directors, representatives, insurers, attorneys, assigns, successors, subsidiaries, and affiliates at any time for any reasons related directly or indirectly to using [employer]. I also agree not to take legal action against [place] or its affiliates for any injury, death, or damage to property caused by or related to using  [employer]. I have read this Waiver and Release of Liability, and I understand the terms used in it and their legal meaning. This Waiver is freely and voluntarily given with the understanding that my right (or any legal minors) to legal action against [place] relating to [employer] is knowingly given up to use these services.   I attest that I read the Ride Waiver and Release of Liability to [name], gave [name] the opportunity to ask questions and answered the questions asked (if any). I affirm that [name] then provided consent for assistance with transportation.       Shakti Fleer, LCSWA

## 2023-06-08 NOTE — Progress Notes (Signed)
 Discharge Note:  Patient discharged with taxi voucher to [facility].  Suicide prevention information given and discussed with patient who stated he understood and had no questions.  Denied HI and auditory hallucinations.  SI, no plan, contracts for safety.  Has talked to staff about his feelings.  Does hear voices at time.  Patient stated he received all his belongings, clothing, toiletries, misc items, etc.  Patient stated he appreciated all assistance received from [facility] staff.  All required discharge information given.

## 2023-06-08 NOTE — Progress Notes (Signed)
   06/08/23 0100  Psych Admission Type (Psych Patients Only)  Admission Status Voluntary  Psychosocial Assessment  Patient Complaints Anxiety  Eye Contact Fair  Facial Expression Flat  Affect Appropriate to circumstance  Speech Logical/coherent  Interaction Assertive  Motor Activity Slow  Appearance/Hygiene Unremarkable  Behavior Characteristics Cooperative  Mood Depressed  Thought Process  Coherency WDL  Content WDL  Delusions None reported or observed  Perception WDL  Hallucination None reported or observed  Judgment WDL  Confusion None  Danger to Self  Current suicidal ideation? Denies  Agreement Not to Harm Self Yes  Description of Agreement Notify Staff.

## 2023-06-08 NOTE — Discharge Summary (Signed)
 Physician Discharge Summary Note  Patient:  [name] is an 62 y.o., male  MRN:  [id number]  DOB:  1960-12-06  Patient phone:  [phone] (home)   Patient address:   [facility] [address] [place] [address],    Date of Admission:  05/30/2023  Date of Discharge: 05-29-/25  Reason for Admission: Worsening suicidal thoughts with plans to hang himself.   Principal Problem: Bipolar 1 disorder, depressed (HCC)  Discharge Diagnoses: Principal Problem:   Bipolar 1 disorder, depressed (HCC)  Past Psychiatric History: See H&P.  Family Psychiatric  History: see h&p Social History:  Social History   Substance and Sexual Activity  Alcohol Use Not Currently   Comment: rare     Social History   Substance and Sexual Activity  Drug Use Not Currently   Types: Marijuana, Cocaine   Comment: last use January 2025, cocaine    Social History   Socioeconomic History   Marital status: Single    Spouse name: Not on file   Number of children: Not on file   Years of education: Not on file   Highest education level: Not on file  Occupational History   Not on file  Tobacco Use   Smoking status: Former    Current packs/day: 0.00    Average packs/day: 0.8 packs/day for 20.0 years (15.0 ttl pk-yrs)    Types: Cigarettes    Start date: 05/16/1964    Quit date: 05/16/1984    Years since quitting: 39.0   Smokeless tobacco: Never  Vaping Use   Vaping status: Never Used  Substance and Sexual Activity   Alcohol use: Not Currently    Comment: rare   Drug use: Not Currently    Types: Marijuana, Cocaine    Comment: last use January 2025, cocaine   Sexual activity: Not Currently  Other Topics Concern   Not on file  Social History Narrative   ** Merged History Encounter **       Social Drivers of Health   Financial Resource Strain: Not on file  Food Insecurity: No Food Insecurity (05/30/2023)   Hunger Vital Sign    Worried About Running Out of Food in  the Last Year: Never true    Ran Out of Food in the Last Year: Never true  Transportation Needs: No Transportation Needs (05/30/2023)   PRAPARE - Administrator, Civil Service (Medical): No    Lack of Transportation (Non-Medical): No  Physical Activity: Not on file  Stress: Not on file  Social Connections: Socially Isolated (02/15/2023)   Social Connection and Isolation Panel [NHANES]    Frequency of Communication with Friends and Family: Once a week    Frequency of Social Gatherings with Friends and Family: Once a week    Attends Religious Services: Never    Database administrator or Organizations: No    Attends Engineer, structural: Never    Marital Status: Never married   Past Medical History:  Past Medical History:  Diagnosis Date   Anxiety    Arthritis    knees and hands   Bipolar 1 disorder, depressed (HCC)    Bipolar disorder (HCC)    Depression    GERD (gastroesophageal reflux disease)    Hepatitis    HEP "C"   History of kidney stones    Hypertension    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 echo  5/17: EF 60-65%, no RWMA, LV dias fxn nl, LA mildly dilated, RV sys fxn nl, PASP nl, moderate sized circumferential pericardial effusion was identified, 2.12 cm around the LV free wall, <1 cm around the RV free wall. Features were not c/w tamponade physiology   PTSD (post-traumatic stress disorder)    Witnessed brother's suicide.   Restless leg syndrome    Seizures (HCC)    Syncope     Past Surgical History:  Procedure Laterality Date   APPENDECTOMY     CARDIOVERSION N/A 04/17/2023   Procedure: CARDIOVERSION;  Surgeon: Constancia Delton, MD;  Location: [facility] ORS;  Service: Cardiovascular;  Laterality: N/A;   CYSTOSCOPY WITH URETEROSCOPY AND STENT PLACEMENT     ESOPHAGOGASTRODUODENOSCOPY N/A 01/11/2016   Procedure: ESOPHAGOGASTRODUODENOSCOPY (EGD);  Surgeon: Baldo Bonds, MD;  Location:  [facility] ENDOSCOPY;  Service: Endoscopy;  Laterality: N/A;   ESOPHAGOGASTRODUODENOSCOPY N/A 04/09/2020   Procedure: ESOPHAGOGASTRODUODENOSCOPY (EGD);  Surgeon: Luke Salaam, MD;  Location: [facility] ENDOSCOPY;  Service: Gastroenterology;  Laterality: N/A;   INCISION AND DRAINAGE ABSCESS Left 01/02/2018   Procedure: INCISION AND DRAINAGE LEFT KNEE;  Surgeon: Marlynn Singer, MD;  Location: [facility] ORS;  Service: Orthopedics;  Laterality: Left;   JOINT REPLACEMENT Right    TKR   KNEE ARTHROSCOPY Right 06/25/2014   Procedure: ARTHROSCOPY KNEE;  Surgeon: Marlynn Singer, MD;  Location: [facility] ORS;  Service: Orthopedics;  Laterality: Right;  partial arthroscopic medial menisectomy   LAPAROSCOPIC APPENDECTOMY N/A 06/02/2021   Procedure: APPENDECTOMY LAPAROSCOPIC;  Surgeon: Flynn Hylan, MD;  Location: [facility] ORS;  Service: General;  Laterality: N/A;   TEE WITHOUT CARDIOVERSION N/A 04/17/2023   Procedure: ECHOCARDIOGRAM, TRANSESOPHAGEAL;  Surgeon: Constancia Delton, MD;  Location: [facility] ORS;  Service: Cardiovascular;  Laterality: N/A;   TOTAL KNEE ARTHROPLASTY Right 04/22/2015   Procedure: TOTAL KNEE ARTHROPLASTY;  Surgeon: Marlynn Singer, MD;  Location: [facility] ORS;  Service: Orthopedics;  Laterality: Right;   TOTAL KNEE ARTHROPLASTY Left 10/30/2017   Procedure: TOTAL KNEE ARTHROPLASTY;  Surgeon: Marlynn Singer, MD;  Location: [facility] ORS;  Service: Orthopedics;  Laterality: Left;   TOTAL KNEE REVISION Left 01/02/2018   Procedure: poly exchange of tibia and patella left knee;  Surgeon: Marlynn Singer, MD;  Location: [facility] ORS;  Service: Orthopedics;  Laterality: Left;   UMBILICAL HERNIA REPAIR  06/02/2021   Procedure: HERNIA REPAIR UMBILICAL ADULT;  Surgeon: Flynn Hylan, MD;  Location: [facility] ORS;  Service: General;;   Family History:  Family History  Problem Relation Age of Onset   CVA Mother        deceased at age 44   Depression Brother        Died by suicide at age 61   Hospital Course: (Per admission  evaluation notes): This is one is one of several psychiatric admission evaluations in the [place] system [facility]) for this 63 year old Caucasian male with hx of substance use disorders & mental health issues. Admitted to the [facility] from the [facility] with complaint of worsening suicidal thoughts with plans to hang himself. Prior to this admission, patient was residing at the [facility] in [place], [place]. Patient was admitted & treated at the [facility] in February of this year, 2025 from 02-15-23 through 03-07-23 with an outpatient recommendation for routine psychiatric care/medication management at the [facility] psychiatric clinic. Patient reports today that he has been feeling/depressed x 1 week after running out of medications. He says he was residing at the [facility] in [place] . He says his boss took him to  [facility] for evaluation of suicidal ideations & worsening suicidal ideations with plans to hang himeself.    This is one of several psychiatric admissions/discharge summaries for this 63 year old Caucasian male with hx of chronic mental illness, substance use disorders & multiple psychiatric admissions. He is known in this [place] systems for worsening symptoms of Bipolar depression. Armany has been tried on multiple psychotropic medications for his symptoms over the years & it appeared he was not very compliant to his medication regimen. For example, when he runs out of his medications, he does not make effeorts to reach out to his primary psychiatrist to have them renewed until his symptoms exacerbates, then he will find himself in the psychiatric unit again. He was brought to the [facility] this time around after evaluation at the [facility]  for evaluation & treatment for worsening symptoms of chronic suicidal ideations.  After evaluation of his presenting symptoms, Arlo was recommended for mood stabilization treatments. The medication regimen for his presenting symptoms were discussed &  with his consent initiated. He received, stabilized & was discharged on the medications as listed below on his discharge medication lists. He was also enrolled & but did not participate in any of the group counseling sessions being offered & held on this unit. He was suppose to learn coping skills that should help help him after discharge to cope better. He presented on this admission, other chronic pre-existing medical conditions that required treatments & monitoring. He was resumed & discharged on all those medications used to treat his medical issues. He tolerated his treatment regimen without any adverse effects or reactions reported. Damien symptoms responded well to his treatment regimen, although it quite a long time to get to this point. He is currently mentally & medically stable for this discharge & patient is also agreeable to this discharge.  During the course of his hospitalization, the 15-minute checks were adequate to ensure Maleik's safety. Patient did not display any dangerous or violent behaviors on this unit. However, he did on daily basis endorsed feeling passively suicidal which he expressed has gone on most of his life. He added that he has never at any time in his life attempted suicide & will never hurt or kill himself. He did interacted with the staff appropriately. His medications were addressed & adjusted to meet his needs. He was recommended for outpatient follow-up care & medication management upon discharge to assure his continuity of care. At the time of discharge, patient is not reporting any acute homicidal ideations. He feels more confident about his self-care & in managing his symptoms. He currently denies any new issues or concerns. Education and supportive counseling provided throughout his hospital stay & upon discharge.  Today upon his discharge evaluation with his treatment team, Riker shares he is doing well. He denies any other specific concerns. He is sleeping well. His  appetite is good. He denies other physical complaints. He denies AH/VH. He feels that his medications have been helpful & is in agreement to continue his current treatment regimen as recommended. He was able to engage in safety planning including plan to return to [facility] or contact emergency services if he feels unable to maintain his own safety or the safety of others. Pt had no further questions, comments, or concerns. He left [facility] with all personal belongings in no apparent distress. Transportation per [facility]. [facility] assisted with taxi fare. Patient plans to go see/visit his girlfriend who is now in a nursing home. He knows  to pick-up his medications which he asked [facility] be [facility] [place], [place].  Physical Findings: AIMS:  , ,  ,  ,    CIWA:    COWS:     Psychiatric Specialty Exam: See H&P.  Presentation  General Appearance:  Appropriate for Environment; Casual; Fairly Groomed  Eye Contact: Good  Speech: Clear and Coherent; Normal Rate  Speech Volume: Normal  Mood and Affect  Mood: Euthymic  Affect: Congruent; Appropriate  Thought Process  Thought Processes: Coherent; Goal Directed  Descriptions of Associations:Intact  Orientation:Full (Time, Place and Person)  Thought Content:Logical  Hallucinations:Hallucinations: None  Ideas of Reference:None  Suicidal Thoughts:Suicidal Thoughts: Yes, Passive SI Passive Intent and/or Plan: Without Intent; Without Plan; Without Means to Carry Out; Without Access to Means (Patient upon this discharge suicide risks assessment has decleared that he will never kill, harm or hurt himself.)  Homicidal Thoughts:Homicidal Thoughts: No  Sensorium  Memory: Immediate Good; Recent Good; Remote Good  Judgment: Good  Insight: Good   Executive Functions  Concentration: Good  Attention Span: Good  Recall: Good  Fund of Knowledge: Fair  Language: Good  Psychomotor Activity  Psychomotor Activity: Psychomotor  Activity: Normal  Musculoskeletal: Strength & Muscle Tone: within normal limits Gait & Station: normal Assets  Assets: Manufacturing systems engineer; Desire for Improvement; Social Support; Resilience  Sleep  Sleep: Sleep: Good Number of Hours of Sleep: 7.75   Physical Exam: Physical Exam Vitals and nursing note reviewed.  HENT:     Head: Normocephalic.     Nose: Nose normal.     Mouth/Throat:     Pharynx: Oropharynx is clear.  Cardiovascular:     Rate and Rhythm: Normal rate.     Pulses: Normal pulses.  Pulmonary:     Effort: Pulmonary effort is normal.  Genitourinary:    Comments: Deferred Musculoskeletal:        General: Normal range of motion.     Cervical back: Normal range of motion.  Skin:    General: Skin is dry.  Neurological:     General: No focal deficit present.     Mental Status: He is alert and oriented to person, place, and time.    Review of Systems  Constitutional:  Negative for chills, diaphoresis and fever.  HENT:  Negative for congestion and sore throat.   Eyes:  Negative for blurred vision.  Respiratory:  Negative for cough, shortness of breath and wheezing.   Cardiovascular:  Negative for chest pain and palpitations.  Gastrointestinal:  Negative for abdominal pain, constipation, diarrhea, heartburn, nausea and vomiting.  Genitourinary:  Negative for dysuria.  Musculoskeletal:  Negative for joint pain and myalgias.  Skin:  Negative for itching and rash.  Neurological:  Negative for dizziness, tingling, tremors, sensory change, speech change, focal weakness, seizures, loss of consciousness, weakness and headaches.  Endo/Heme/Allergies:        Allergies: Toradol   Psychiatric/Behavioral:  Positive for depression and suicidal ideas (Passive ideations. Pt adamantly denies any plans, ientent or means. Declares that he will never hurt himself.). Negative for hallucinations, memory loss and substance abuse (Hx of). The patient has insomnia (Stable on  medication.). The patient is not nervous/anxious (Stable upon discharge.).    Blood pressure 105/60, pulse 92, temperature (!) 97.5 F (36.4 C), temperature source Oral, resp. rate 16, height 5\' 9"  (1.753 m), weight 77 kg, SpO2 98%. Body mass index is 25.07 kg/m.   Social History   Tobacco Use  Smoking Status Former  Current packs/day: 0.00   Average packs/day: 0.8 packs/day for 20.0 years (15.0 ttl pk-yrs)   Types: Cigarettes   Start date: 05/16/1964   Quit date: 05/16/1984   Years since quitting: 39.0  Smokeless Tobacco Never   Tobacco Cessation:  A prescription for an FDA-approved tobacco cessation medication was offered at discharge and the patient refused   Blood Alcohol level:  Lab Results  Component Value Date   [facility] <15 05/29/2023   ETH <10 04/12/2023    Metabolic Disorder Labs:  Lab Results  Component Value Date   HGBA1C 4.9 06/01/2023   MPG 93.93 06/01/2023   MPG 111.15 12/29/2022   No results found for: "PROLACTIN" Lab Results  Component Value Date   CHOL 246 (H) 06/01/2023   TRIG 129 06/01/2023   HDL 37 (L) 06/01/2023   CHOLHDL 6.6 06/01/2023   VLDL 26 06/01/2023   LDLCALC 183 (H) 06/01/2023   LDLCALC 146 (H) 12/29/2022   See Psychiatric Specialty Exam and Suicide Risk Assessment completed by Attending Physician prior to discharge.  Discharge destination:  Home  Is patient on multiple antipsychotic therapies at discharge:  No   Has Patient had three or more failed trials of antipsychotic monotherapy by history:  No  Recommended Plan for Multiple Antipsychotic Therapies: NA  Allergies as of 06/08/2023       Reactions   Ketorolac  Tromethamine  Hives, Rash   Other Reaction(s): Not available        Medication List     STOP taking these medications    aspirin  EC 81 MG tablet   levOCARNitine  1 GM/10ML solution Commonly known as: CARNITOR    traMADol  50 MG tablet Commonly known as: ULTRAM    venlafaxine  XR 150 MG 24 hr capsule Commonly  known as: EFFEXOR -XR       TAKE these medications      Indication  amiodarone  200 MG tablet Commonly known as: PACERONE  Take 1 tablet (200 mg total) by mouth daily. For cardiac issues. What changed: See the new instructions.  Indication: Cardic issues.   atorvastatin 20 MG tablet Commonly known as: LIPITOR Take 1 tablet (20 mg total) by mouth daily. For hyperlipidemia.  Indication: High Amount of Fats in the Blood   divalproex  250 MG DR tablet Commonly known as: DEPAKOTE  Take 3 tablets (750 mg total) by mouth 2 (two) times daily. For mood stabilization What changed: additional instructions  Indication: Manic Phase of Manic-Depression   doxepin  50 MG capsule Commonly known as: SINEQUAN  Take 1 capsule (50 mg total) by mouth at bedtime. For sleep What changed: additional instructions  Indication: Sleep   escitalopram 20 MG tablet Commonly known as: LEXAPRO Take 1 tablet (20 mg total) by mouth at bedtime. For depression  Indication: Major Depressive Disorder   feeding supplement Liqd Take 237 mLs by mouth 3 (three) times daily between meals.  Indication: diestary supplement.   hydrOXYzine  25 MG tablet Commonly known as: ATARAX  Take 1 tablet (25 mg total) by mouth 3 (three) times daily as needed for anxiety.  Indication: Feeling Anxious   lurasidone  20 MG Tabs tablet Commonly known as: LATUDA  Take 1 tablet (20 mg total) by mouth daily with supper. For mood control What changed:  how much to take additional instructions  Indication: MIXED BIPOLAR AFFECTIVE DISORDER   melatonin 5 MG Tabs Take 1 tablet (5 mg total) by mouth at bedtime. For sleep What changed: additional instructions  Indication: Trouble Sleeping   metoprolol  succinate 25 MG 24 hr tablet Commonly known as: TOPROL -XL  Take 1 tablet (25 mg total) by mouth daily. For high blood pressure. Start taking on: Jun 09, 2023 What changed: additional instructions  Indication: High Blood Pressure    multivitamin with minerals Tabs tablet Take 1 tablet by mouth daily.  Indication: Vitamin supplementation.   prazosin 2 MG capsule Commonly known as: MINIPRESS Take 1 capsule (2 mg total) by mouth at bedtime. For nightmares.  Indication: Frightening Dreams   tamsulosin  0.4 MG Caps capsule Commonly known as: FLOMAX  Take 1 capsule (0.4 mg total) by mouth daily after supper. For Prostate health What changed: additional instructions  Indication: Benign Enlargement of Prostate        Follow-up Information     [facility] . Go on 06/14/2023.   Why: You have a hospital follow up appointment on 06/14/23 at 9:00 am. The appointment will be held in person. Following this appointment you will be scheduled for a clinical assessment, to obtain therapy and medication management services. Contact information: [address] [place] [address] [phone]                Follow-up recommendations:    Signed: Asuncion Layer, NP 06/08/2023, 10:42 AM

## 2023-06-08 NOTE — Progress Notes (Signed)
  [facility] Adult Case Management Discharge Plan :  Will you be returning to the same living situation after discharge:  Yes,  pt will be returning to [facility] after discharge. At discharge, do you have transportation home?: Yes,  pt will be transported via taxi voucher. Do you have the ability to pay for your medications: Yes,  pt is insured - [facility] Tailored Plan.  Release of information consent forms completed and in the chart;  Patient's signature needed at discharge.  Patient to Follow up at:  Follow-up Information     [facility] [place]. Go on 06/14/2023.   Why: You have a hospital follow up appointment on 06/14/23 at 9:00 am. The appointment will be held in person. Following this appointment you will be scheduled for a clinical assessment, to obtain therapy and medication management services. Contact information: [address] [place] [address] [phone]                 Next level of care provider has access to [facility] Link:no  Safety Planning and Suicide Prevention discussed: Yes,  safety planning completed with patient, as we were unable to get in touch with support person.     Has patient been referred to the Quitline?: Patient does not use tobacco/nicotine products  Patient has been referred for addiction treatment: No known substance use disorder.  Dartanian Knaggs M Dovey Fatzinger, LCSWA 06/08/2023, 9:55 AM

## 2023-06-08 NOTE — Plan of Care (Signed)
   Problem: Education: Goal: Emotional status will improve Outcome: Progressing Goal: Mental status will improve Outcome: Progressing Goal: Verbalization of understanding the information provided will improve Outcome: Progressing

## 2023-06-08 NOTE — Progress Notes (Signed)
 D:  Patient's self inventory sheet, patient has fair sleep, no sleep medication given.  Poor appetite, low energy level, good concentration.  Rated depression, hopeless and anxiety 10.  Denied withdrawals.  Sl passive, no plan, contracts for safety.  Denied physical problems.  Denied physical pain.  A:  Medications administered per MD orders.  Emotional support and encouragement given patient. R:  Patient denied HI.  Denied auditory hallucinations.  SI passive, no plan, contracts for safety.   Safety maintained with 15 minute checks.

## 2023-06-16 ENCOUNTER — Emergency Department: Payer: MEDICAID

## 2023-06-16 ENCOUNTER — Other Ambulatory Visit: Payer: Self-pay

## 2023-06-16 ENCOUNTER — Emergency Department
Admission: EM | Admit: 2023-06-16 | Discharge: 2023-06-18 | Disposition: A | Discharge status: Other Acute Inpatient Hospital | Payer: MEDICAID | Attending: Emergency Medicine | Admitting: Emergency Medicine

## 2023-06-16 DIAGNOSIS — Z23 Encounter for immunization: Secondary | ICD-10-CM | POA: Insufficient documentation

## 2023-06-16 DIAGNOSIS — W0110XA Fall on same level from slipping, tripping and stumbling with subsequent striking against unspecified object, initial encounter: Secondary | ICD-10-CM | POA: Insufficient documentation

## 2023-06-16 DIAGNOSIS — R45851 Suicidal ideations: Secondary | ICD-10-CM

## 2023-06-16 DIAGNOSIS — Z87891 Personal history of nicotine dependence: Secondary | ICD-10-CM | POA: Insufficient documentation

## 2023-06-16 DIAGNOSIS — I10 Essential (primary) hypertension: Secondary | ICD-10-CM | POA: Insufficient documentation

## 2023-06-16 DIAGNOSIS — R569 Unspecified convulsions: Secondary | ICD-10-CM | POA: Diagnosis present

## 2023-06-16 DIAGNOSIS — F333 Major depressive disorder, recurrent, severe with psychotic symptoms: Secondary | ICD-10-CM

## 2023-06-16 DIAGNOSIS — Z79899 Other long term (current) drug therapy: Secondary | ICD-10-CM | POA: Insufficient documentation

## 2023-06-16 DIAGNOSIS — F3130 Bipolar disorder, current episode depressed, mild or moderate severity, unspecified: Secondary | ICD-10-CM | POA: Diagnosis not present

## 2023-06-16 DIAGNOSIS — S0001XA Abrasion of scalp, initial encounter: Secondary | ICD-10-CM | POA: Insufficient documentation

## 2023-06-16 LAB — COMPREHENSIVE METABOLIC PANEL WITH GFR
ALT: 25 U/L (ref 0–44)
AST: 28 U/L (ref 15–41)
Albumin: 3.8 g/dL (ref 3.5–5.0)
Alkaline Phosphatase: 50 U/L (ref 38–126)
Anion gap: 11 (ref 5–15)
BUN: 17 mg/dL (ref 6–20)
CO2: 20 mmol/L — ABNORMAL LOW (ref 22–32)
Calcium: 8.8 mg/dL — ABNORMAL LOW (ref 8.9–10.3)
Chloride: 106 mmol/L (ref 98–111)
Creatinine, Ser: 1.18 mg/dL (ref 0.61–1.24)
GFR, Estimated: >60 mL/min (ref >60)
Glucose, Bld: 81 mg/dL (ref 70–99)
Potassium: 3.6 mmol/L (ref 3.5–5.1)
Sodium: 137 mmol/L (ref 135–145)
Total Bilirubin: 1.1 mg/dL (ref 0.0–1.2)
Total Protein: 7.5 g/dL (ref 6.5–8.1)

## 2023-06-16 LAB — ACETAMINOPHEN LEVEL: Acetaminophen (Tylenol), Serum: <10 ug/mL — ABNORMAL LOW (ref 10–30)

## 2023-06-16 LAB — CBC WITH DIFFERENTIAL/PLATELET
Abs Immature Granulocytes: 0.02 10*3/uL (ref 0.00–0.07)
Basophils Absolute: 0 10*3/uL (ref 0.0–0.1)
Basophils Relative: 0 %
Eosinophils Absolute: 0 10*3/uL (ref 0.0–0.5)
Eosinophils Relative: 0 %
HCT: 38.4 % — ABNORMAL LOW (ref 39.0–52.0)
Hemoglobin: 12.5 g/dL — ABNORMAL LOW (ref 13.0–17.0)
Immature Granulocytes: 0 %
Lymphocytes Relative: 28 %
Lymphs Abs: 1.3 10*3/uL (ref 0.7–4.0)
MCH: 30 pg (ref 26.0–34.0)
MCHC: 32.6 g/dL (ref 30.0–36.0)
MCV: 92.1 fL (ref 80.0–100.0)
Monocytes Absolute: 0.5 10*3/uL (ref 0.1–1.0)
Monocytes Relative: 10 %
Neutro Abs: 2.8 10*3/uL (ref 1.7–7.7)
Neutrophils Relative %: 62 %
Platelets: 219 10*3/uL (ref 150–400)
RBC: 4.17 MIL/uL — ABNORMAL LOW (ref 4.22–5.81)
RDW: 14.9 % (ref 11.5–15.5)
WBC: 4.6 10*3/uL (ref 4.0–10.5)
nRBC: 0 % (ref 0.0–0.2)

## 2023-06-16 LAB — VALPROIC ACID LEVEL: Valproic Acid Lvl: <10 ug/mL — ABNORMAL LOW (ref 50–100)

## 2023-06-16 LAB — SALICYLATE LEVEL: Salicylate Lvl: <7 mg/dL — ABNORMAL LOW (ref 7.0–30.0)

## 2023-06-16 LAB — CK: Total CK: 425 U/L — ABNORMAL HIGH (ref 49–397)

## 2023-06-16 MED ORDER — ATORVASTATIN CALCIUM 20 MG PO TABS
20.0000 mg | ORAL_TABLET | Freq: Every day | ORAL | Status: DC
  Administered 2023-06-16 – 2023-06-18 (×3): 20 mg via ORAL
  Filled 2023-06-16 (×3): qty 1

## 2023-06-16 MED ORDER — MELATONIN 5 MG PO TABS
5.0000 mg | ORAL_TABLET | Freq: Every day | ORAL | Status: DC
  Administered 2023-06-16: 5 mg via ORAL
  Filled 2023-06-16: qty 1

## 2023-06-16 MED ORDER — DIVALPROEX SODIUM 500 MG PO DR TAB
750.0000 mg | DELAYED_RELEASE_TABLET | Freq: Two times a day (BID) | ORAL | Status: DC
  Administered 2023-06-17 – 2023-06-18 (×3): 750 mg via ORAL
  Filled 2023-06-16 (×3): qty 1

## 2023-06-16 MED ORDER — ACETAMINOPHEN 500 MG PO TABS
1000.0000 mg | ORAL_TABLET | Freq: Once | ORAL | Status: AC
  Administered 2023-06-16: 1000 mg via ORAL
  Filled 2023-06-16: qty 2

## 2023-06-16 MED ORDER — ENSURE ENLIVE PO LIQD
237.0000 mL | Freq: Three times a day (TID) | ORAL | Status: DC
  Administered 2023-06-16 – 2023-06-18 (×3): 237 mL via ORAL
  Filled 2023-06-16 (×7): qty 237

## 2023-06-16 MED ORDER — DIVALPROEX SODIUM 500 MG PO DR TAB
750.0000 mg | DELAYED_RELEASE_TABLET | Freq: Once | ORAL | Status: AC
  Administered 2023-06-16: 750 mg via ORAL
  Filled 2023-06-16: qty 1

## 2023-06-16 MED ORDER — AMIODARONE HCL 200 MG PO TABS
200.0000 mg | ORAL_TABLET | Freq: Every day | ORAL | Status: DC
  Administered 2023-06-16 – 2023-06-18 (×3): 200 mg via ORAL
  Filled 2023-06-16 (×3): qty 1

## 2023-06-16 MED ORDER — METOPROLOL SUCCINATE ER 25 MG PO TB24
25.0000 mg | ORAL_TABLET | Freq: Every day | ORAL | Status: DC
  Administered 2023-06-16 – 2023-06-18 (×3): 25 mg via ORAL
  Filled 2023-06-16 (×3): qty 1

## 2023-06-16 MED ORDER — TAMSULOSIN HCL 0.4 MG PO CAPS
0.4000 mg | ORAL_CAPSULE | Freq: Every day | ORAL | Status: DC
  Administered 2023-06-17: 0.4 mg via ORAL
  Filled 2023-06-16: qty 1

## 2023-06-16 MED ORDER — SODIUM CHLORIDE 0.9 % IV BOLUS
1000.0000 mL | Freq: Once | INTRAVENOUS | Status: AC
  Administered 2023-06-16: 1000 mL via INTRAVENOUS

## 2023-06-16 MED ORDER — HYDROXYZINE HCL 25 MG PO TABS
25.0000 mg | ORAL_TABLET | Freq: Three times a day (TID) | ORAL | Status: DC | PRN
  Administered 2023-06-16 – 2023-06-17 (×2): 25 mg via ORAL
  Filled 2023-06-16 (×2): qty 1

## 2023-06-16 MED ORDER — PRAZOSIN HCL 2 MG PO CAPS
2.0000 mg | ORAL_CAPSULE | Freq: Every day | ORAL | Status: DC
  Administered 2023-06-16 – 2023-06-17 (×2): 2 mg via ORAL
  Filled 2023-06-16 (×3): qty 1

## 2023-06-16 MED ORDER — ADULT MULTIVITAMIN W/MINERALS CH
1.0000 | ORAL_TABLET | Freq: Every day | ORAL | Status: DC
  Administered 2023-06-16 – 2023-06-18 (×3): 1 via ORAL
  Filled 2023-06-16 (×3): qty 1

## 2023-06-16 MED ORDER — TETANUS-DIPHTH-ACELL PERTUSSIS 5-2.5-18.5 LF-MCG/0.5 IM SUSY
0.5000 mL | PREFILLED_SYRINGE | Freq: Once | INTRAMUSCULAR | Status: AC
  Administered 2023-06-16: 0.5 mL via INTRAMUSCULAR
  Filled 2023-06-16: qty 0.5

## 2023-06-16 MED ORDER — DOXEPIN HCL 50 MG PO CAPS
50.0000 mg | ORAL_CAPSULE | Freq: Every day | ORAL | Status: DC
  Administered 2023-06-16 – 2023-06-17 (×2): 50 mg via ORAL
  Filled 2023-06-16 (×3): qty 1

## 2023-06-16 MED ORDER — ESCITALOPRAM OXALATE 10 MG PO TABS
20.0000 mg | ORAL_TABLET | Freq: Every day | ORAL | Status: DC
  Administered 2023-06-16 – 2023-06-17 (×2): 20 mg via ORAL
  Filled 2023-06-16 (×2): qty 2

## 2023-06-16 MED ORDER — LURASIDONE HCL 20 MG PO TABS
20.0000 mg | ORAL_TABLET | Freq: Every day | ORAL | Status: DC
  Administered 2023-06-17: 20 mg via ORAL
  Filled 2023-06-16 (×3): qty 1
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
## 2023-06-16 NOTE — ED Notes (Signed)
 Pt taken to CT before this RN was able to obtain blood work or dress out.

## 2023-06-16 NOTE — ED Notes (Signed)
 Pt dressed out at this time.  Pt belongings include:  [name] of socks [employer]
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
## 2023-06-16 NOTE — ED Notes (Signed)
 Charge notified that pt has SI and needs 1:1 supervision

## 2023-06-17 MED ORDER — ACETAMINOPHEN 325 MG PO TABS
650.0000 mg | ORAL_TABLET | Freq: Once | ORAL | Status: AC
  Administered 2023-06-17: 650 mg via ORAL

## 2023-06-17 MED ORDER — ACETAMINOPHEN 325 MG PO TABS
ORAL_TABLET | ORAL | Status: AC
Start: 2023-06-17 — End: 2023-06-18
  Filled 2023-06-17: qty 2

## 2023-06-17 MED ORDER — LOPERAMIDE HCL 2 MG PO CAPS
2.0000 mg | ORAL_CAPSULE | Freq: Once | ORAL | Status: AC
  Administered 2023-06-17: 2 mg via ORAL
  Filled 2023-06-17: qty 1
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
## 2023-06-17 NOTE — ED Notes (Signed)
Pt received meal tray and beverage at this time. 

## 2023-06-17 NOTE — ED Notes (Signed)
Pt received dinner tray.

## 2023-06-17 NOTE — ED Notes (Signed)
Provided pt with snack tray at this time. 

## 2023-06-17 NOTE — ED Notes (Signed)
 Pt ambulated to the bathroom without any assistance and back to bed.

## 2023-06-17 NOTE — ED Notes (Signed)
 Patient refused breakfast. Patient said "I have diarrhea and I do not want anything."

## 2023-06-17 NOTE — BH Assessment (Signed)
..  Per [facility] Carolin), patient to be referred out of system.  Referral information for Psychiatric Hospitalization faxed to;    [facility] [phone]- [phone]) [phone]  [place] [phone]),  [place] [phone], [phone], [phone] or [phone]),   [place] [phone]--- [phone]--- [phone])  [address] [phone]),   [place] [phone] -or- [phone]),   [facility] [phone]),  [address] [phone])  [place] [phone]),   [place] [facility] [phone]),   [place] [phone] or [phone]),   [facility] [phone])

## 2023-06-18 ENCOUNTER — Encounter (HOSPITAL_COMMUNITY): Payer: Self-pay | Admitting: Adult Health

## 2023-06-18 ENCOUNTER — Other Ambulatory Visit: Payer: Self-pay

## 2023-06-18 ENCOUNTER — Inpatient Hospital Stay (HOSPITAL_COMMUNITY)
Admission: AD | Admit: 2023-06-18 | Discharge: 2023-07-04 | DRG: 885 | Disposition: A | Discharge status: Home / Self Care | Payer: MEDICAID | Source: Intra-hospital | Attending: Psychiatry | Admitting: Psychiatry

## 2023-06-18 DIAGNOSIS — K219 Gastro-esophageal reflux disease without esophagitis: Secondary | ICD-10-CM | POA: Diagnosis present

## 2023-06-18 DIAGNOSIS — G2581 Restless legs syndrome: Secondary | ICD-10-CM | POA: Diagnosis present

## 2023-06-18 DIAGNOSIS — F4310 Post-traumatic stress disorder, unspecified: Secondary | ICD-10-CM | POA: Diagnosis present

## 2023-06-18 DIAGNOSIS — F4312 Post-traumatic stress disorder, chronic: Secondary | ICD-10-CM | POA: Diagnosis present

## 2023-06-18 DIAGNOSIS — F1011 Alcohol abuse, in remission: Secondary | ICD-10-CM | POA: Diagnosis present

## 2023-06-18 DIAGNOSIS — F333 Major depressive disorder, recurrent, severe with psychotic symptoms: Secondary | ICD-10-CM | POA: Diagnosis not present

## 2023-06-18 DIAGNOSIS — Z79899 Other long term (current) drug therapy: Secondary | ICD-10-CM | POA: Diagnosis not present

## 2023-06-18 DIAGNOSIS — F332 Major depressive disorder, recurrent severe without psychotic features: Principal | ICD-10-CM | POA: Diagnosis present

## 2023-06-18 DIAGNOSIS — Z87891 Personal history of nicotine dependence: Secondary | ICD-10-CM | POA: Diagnosis not present

## 2023-06-18 DIAGNOSIS — I10 Essential (primary) hypertension: Secondary | ICD-10-CM | POA: Diagnosis present

## 2023-06-18 DIAGNOSIS — R45851 Suicidal ideations: Secondary | ICD-10-CM | POA: Diagnosis present

## 2023-06-18 DIAGNOSIS — F419 Anxiety disorder, unspecified: Secondary | ICD-10-CM | POA: Diagnosis present

## 2023-06-18 DIAGNOSIS — Z823 Family history of stroke: Secondary | ICD-10-CM

## 2023-06-18 DIAGNOSIS — Z5900 Homelessness unspecified: Secondary | ICD-10-CM | POA: Diagnosis not present

## 2023-06-18 DIAGNOSIS — Z5941 Food insecurity: Secondary | ICD-10-CM | POA: Diagnosis not present

## 2023-06-18 DIAGNOSIS — Z96653 Presence of artificial knee joint, bilateral: Secondary | ICD-10-CM | POA: Diagnosis present

## 2023-06-18 DIAGNOSIS — F1410 Cocaine abuse, uncomplicated: Secondary | ICD-10-CM | POA: Diagnosis present

## 2023-06-18 DIAGNOSIS — Z604 Social exclusion and rejection: Secondary | ICD-10-CM | POA: Diagnosis present

## 2023-06-18 DIAGNOSIS — Z818 Family history of other mental and behavioral disorders: Secondary | ICD-10-CM | POA: Diagnosis not present

## 2023-06-18 DIAGNOSIS — E785 Hyperlipidemia, unspecified: Secondary | ICD-10-CM | POA: Diagnosis present

## 2023-06-18 DIAGNOSIS — G4700 Insomnia, unspecified: Secondary | ICD-10-CM | POA: Diagnosis present

## 2023-06-18 DIAGNOSIS — I4819 Other persistent atrial fibrillation: Secondary | ICD-10-CM | POA: Diagnosis present

## 2023-06-18 DIAGNOSIS — F515 Nightmare disorder: Secondary | ICD-10-CM | POA: Diagnosis not present

## 2023-06-18 DIAGNOSIS — Z560 Unemployment, unspecified: Secondary | ICD-10-CM | POA: Diagnosis not present

## 2023-06-18 DIAGNOSIS — I2510 Atherosclerotic heart disease of native coronary artery without angina pectoris: Secondary | ICD-10-CM | POA: Diagnosis present

## 2023-06-18 DIAGNOSIS — R569 Unspecified convulsions: Secondary | ICD-10-CM | POA: Diagnosis not present

## 2023-06-18 LAB — URINE DRUG SCREEN, QUALITATIVE (ARMC ONLY)
Amphetamines, Ur Screen: NOT DETECTED
Barbiturates, Ur Screen: NOT DETECTED
Benzodiazepine, Ur Scrn: NOT DETECTED
Cannabinoid 50 Ng, Ur ~~LOC~~: NOT DETECTED
Cocaine Metabolite,Ur ~~LOC~~: NOT DETECTED
MDMA (Ecstasy)Ur Screen: NOT DETECTED
Methadone Scn, Ur: NOT DETECTED
Opiate, Ur Screen: NOT DETECTED
Phencyclidine (PCP) Ur S: NOT DETECTED
Tricyclic, Ur Screen: NOT DETECTED

## 2023-06-18 MED ORDER — DIPHENHYDRAMINE HCL 50 MG/ML IJ SOLN: 50.0000 mg | Freq: Three times a day (TID) | INTRAMUSCULAR | Status: DC | PRN

## 2023-06-18 MED ORDER — TAMSULOSIN HCL 0.4 MG PO CAPS
0.4000 mg | ORAL_CAPSULE | Freq: Every day | ORAL | Status: DC
  Administered 2023-06-18 – 2023-06-21 (×4): 0.4 mg via ORAL
  Filled 2023-06-18 (×4): qty 1

## 2023-06-18 MED ORDER — ADULT MULTIVITAMIN W/MINERALS CH
1.0000 | ORAL_TABLET | Freq: Every day | ORAL | Status: DC
Start: 2023-06-19 — End: 2023-07-04
  Administered 2023-06-19 – 2023-07-04 (×16): 1 via ORAL
  Filled 2023-06-18 (×16): qty 1

## 2023-06-18 MED ORDER — DIVALPROEX SODIUM 500 MG PO DR TAB
750.0000 mg | DELAYED_RELEASE_TABLET | Freq: Two times a day (BID) | ORAL | Status: DC
  Administered 2023-06-18 – 2023-07-03 (×31): 750 mg via ORAL
  Filled 2023-06-18 (×31): qty 1

## 2023-06-18 MED ORDER — ESCITALOPRAM OXALATE 10 MG PO TABS
20.0000 mg | ORAL_TABLET | Freq: Every day | ORAL | Status: DC
  Administered 2023-06-18 – 2023-06-19 (×2): 20 mg via ORAL
  Filled 2023-06-18 (×2): qty 2

## 2023-06-18 MED ORDER — PRAZOSIN HCL 1 MG PO CAPS
2.0000 mg | ORAL_CAPSULE | Freq: Every day | ORAL | Status: DC
  Administered 2023-06-18 – 2023-06-23 (×6): 2 mg via ORAL
  Filled 2023-06-18 (×6): qty 2

## 2023-06-18 MED ORDER — ALUM & MAG HYDROXIDE-SIMETH 200-200-20 MG/5ML PO SUSP
30.0000 mL | ORAL | Status: DC | PRN
  Administered 2023-06-25 – 2023-07-03 (×9): 30 mL via ORAL
  Filled 2023-06-18 (×9): qty 30

## 2023-06-18 MED ORDER — ACETAMINOPHEN 325 MG PO TABS
650.0000 mg | ORAL_TABLET | Freq: Four times a day (QID) | ORAL | Status: DC | PRN
  Administered 2023-06-18 – 2023-07-03 (×8): 650 mg via ORAL
  Filled 2023-06-18 (×8): qty 2

## 2023-06-18 MED ORDER — LORAZEPAM 2 MG/ML IJ SOLN: 2.0000 mg | Freq: Three times a day (TID) | INTRAMUSCULAR | Status: DC | PRN

## 2023-06-18 MED ORDER — HALOPERIDOL LACTATE 5 MG/ML IJ SOLN: 10.0000 mg | Freq: Three times a day (TID) | INTRAMUSCULAR | Status: DC | PRN

## 2023-06-18 MED ORDER — AMIODARONE HCL 200 MG PO TABS
200.0000 mg | ORAL_TABLET | Freq: Every day | ORAL | Status: DC
  Administered 2023-06-19 – 2023-07-04 (×16): 200 mg via ORAL
  Filled 2023-06-18 (×7): qty 1
  Filled 2023-06-18: qty 14
  Filled 2023-06-18 (×10): qty 1

## 2023-06-18 MED ORDER — METOPROLOL SUCCINATE ER 25 MG PO TB24
25.0000 mg | ORAL_TABLET | Freq: Every day | ORAL | Status: DC
  Administered 2023-06-19 – 2023-06-21 (×3): 25 mg via ORAL
  Filled 2023-06-18 (×3): qty 1

## 2023-06-18 MED ORDER — ATORVASTATIN CALCIUM 10 MG PO TABS
20.0000 mg | ORAL_TABLET | Freq: Every day | ORAL | Status: DC
  Administered 2023-06-19 – 2023-06-21 (×3): 20 mg via ORAL
  Filled 2023-06-18 (×3): qty 2

## 2023-06-18 MED ORDER — HALOPERIDOL 5 MG PO TABS: 5.0000 mg | ORAL_TABLET | Freq: Three times a day (TID) | ORAL | Status: DC | PRN

## 2023-06-18 MED ORDER — ENSURE ENLIVE PO LIQD
237.0000 mL | Freq: Three times a day (TID) | ORAL | Status: DC
  Filled 2023-06-18 (×8): qty 237

## 2023-06-18 MED ORDER — MAGNESIUM HYDROXIDE 400 MG/5ML PO SUSP: 30.0000 mL | Freq: Every day | ORAL | Status: DC | PRN

## 2023-06-18 MED ORDER — HYDROXYZINE HCL 25 MG PO TABS
25.0000 mg | ORAL_TABLET | Freq: Three times a day (TID) | ORAL | Status: DC | PRN
  Administered 2023-06-18 – 2023-07-02 (×18): 25 mg via ORAL
  Filled 2023-06-18 (×3): qty 1
  Filled 2023-06-18: qty 84
  Filled 2023-06-18 (×15): qty 1

## 2023-06-18 MED ORDER — DIPHENHYDRAMINE HCL 25 MG PO CAPS: 50.0000 mg | ORAL_CAPSULE | Freq: Three times a day (TID) | ORAL | Status: DC | PRN

## 2023-06-18 MED ORDER — HALOPERIDOL LACTATE 5 MG/ML IJ SOLN: 5.0000 mg | Freq: Three times a day (TID) | INTRAMUSCULAR | Status: DC | PRN

## 2023-06-18 MED ORDER — DOXEPIN HCL 25 MG PO CAPS
50.0000 mg | ORAL_CAPSULE | Freq: Every day | ORAL | Status: DC
  Administered 2023-06-18 – 2023-06-19 (×2): 50 mg via ORAL
  Filled 2023-06-18 (×2): qty 2

## 2023-06-18 MED ORDER — LURASIDONE HCL 20 MG PO TABS
20.0000 mg | ORAL_TABLET | Freq: Every day | ORAL | Status: DC
  Administered 2023-06-18 – 2023-06-23 (×6): 20 mg via ORAL
  Filled 2023-06-18 (×6): qty 1
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
## 2023-06-18 NOTE — ED Notes (Signed)
VOL/pending placement 

## 2023-06-18 NOTE — ED Notes (Signed)
 Provided pt with a sprite at this time.
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
## 2023-06-18 NOTE — Progress Notes (Signed)
 BHH/BMU LCSW Progress Note   06/18/2023    2:35 PM  [name] Colon Isaacson   [phone]   Type of Contact and Topic:  Psychiatric Bed Placement   Pt accepted to [facility] 404-2     Patient meets inpatient criteria per Orvil Bland, NP    The attending provider will be Dr. Thirza Fleet  Call report to [phone]    Sheria Dills, RN @ [facility] ED notified.     Pt scheduled  to arrive at [facility] TODAY.    Mari Battaglia, MSW, LCSW-A  2:36 PM 06/18/2023

## 2023-06-18 NOTE — ED Notes (Signed)
 Pt given lunch tray.

## 2023-06-18 NOTE — ED Notes (Signed)
 EMTALA Reviewed by this RN, voluntary consent obtained via hard copy and sent with pt.

## 2023-06-18 NOTE — Tx Team (Signed)
 Initial Treatment Plan 06/18/2023 8:05 PM [name] [id number]    PATIENT STRESSORS: Financial difficulties   Loss of housing     PATIENT STRENGTHS: Average or above average intelligence  Capable of independent living  Communication skills  Motivation for treatment/growth    PATIENT IDENTIFIED PROBLEMS: Suicidal ideation      Hopelessness    Increased depression    Homeless       DISCHARGE CRITERIA:  Improved stabilization in mood, thinking, and/or behavior Reduction of life-threatening or endangering symptoms to within safe limits Verbal commitment to aftercare and medication compliance  PRELIMINARY DISCHARGE PLAN: Outpatient therapy Placement in alternative living arrangements  PATIENT/FAMILY INVOLVEMENT: This treatment plan has been presented to and reviewed with the patient, [name],.  The patient hasSui been given the opportunity to ask questions and make suggestions.  Victor Grater, RN 06/18/2023, 8:05 PM

## 2023-06-18 NOTE — ED Notes (Signed)
 Pt transported to [facility] with safe transport

## 2023-06-18 NOTE — ED Notes (Signed)
Pt provided with breakfast tray and beverage

## 2023-06-18 NOTE — BHH Group Notes (Signed)
 BHH Group Notes:  (Nursing/MHT/Case Management/Adjunct)  Date:  06/18/2023  Time:  2000  Type of Therapy:  Wrap up group  Participation Level:  Active  Participation Quality:  Appropriate, Attentive, Sharing, and Supportive  Affect:  Flat  Cognitive:  Alert  Insight:  Improving  Engagement in Group:  Engaged  Modes of Intervention:  Clarification, Education, and Support  Summary of Progress/Problems: Positive thinking and positive change were discussed.   [name] 06/18/2023, 9:47 PM

## 2023-06-18 NOTE — Consult Note (Signed)
 Per Kathryn Parish, [facility], patient accepted to [facility] room 404-2 pending receipt of voluntary receipt.   Accepting is Orvil Bland NP; Attending is Zouev MD. Dx MDD.

## 2023-06-18 NOTE — BHH Counselor (Signed)
 Referral faxed to [facility].

## 2023-06-19 ENCOUNTER — Encounter (HOSPITAL_COMMUNITY): Payer: Self-pay

## 2023-06-19 DIAGNOSIS — F332 Major depressive disorder, recurrent severe without psychotic features: Secondary | ICD-10-CM

## 2023-06-19 DIAGNOSIS — F4312 Post-traumatic stress disorder, chronic: Secondary | ICD-10-CM | POA: Insufficient documentation

## 2023-06-19 LAB — VITAMIN B12: Vitamin B-12: 430 pg/mL (ref 180–914)

## 2023-06-19 LAB — FOLATE: Folate: 15.9 ng/mL (ref >5.9)

## 2023-06-19 NOTE — Plan of Care (Signed)
   Problem: Education: Goal: Knowledge of [place] [facility] Education information/materials will improve Outcome: Progressing   Problem: Safety: Goal: Periods of time without injury will increase Outcome: Progressing
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
## 2023-06-19 NOTE — Plan of Care (Signed)
   Problem: Education: Goal: Knowledge of [place] [facility] Education information/materials will improve Outcome: Progressing   Problem: Coping: Goal: Ability to verbalize frustrations and anger appropriately will improve Outcome: Progressing   Problem: Safety: Goal: Periods of time without injury will increase Outcome: Progressing

## 2023-06-19 NOTE — BH IP Treatment Plan (Signed)
 Interdisciplinary Treatment and Diagnostic Plan Update  06/19/2023 Time of Session: [address] [place] MRN: [id number]  Principal Diagnosis: MDD (major depressive disorder), recurrent episode, severe (HCC)  Secondary Diagnoses: Principal Problem:   MDD (major depressive disorder), recurrent episode, severe (HCC) Active Problems:   PTSD (post-traumatic stress disorder)   Nightmares associated with chronic post-traumatic stress disorder   Current Medications:  Current Facility-Administered Medications  Medication Dose Route Frequency Provider Last Rate Last Admin   acetaminophen  (TYLENOL ) tablet 650 mg  650 mg Oral Q6H PRN Mills, Shnese E, NP   650 mg at 06/18/23 1828   alum & mag hydroxide-simeth (MAALOX/MYLANTA) 200-200-20 MG/5ML suspension 30 mL  30 mL Oral Q4H PRN Mills, Shnese E, NP       amiodarone  (PACERONE ) tablet 200 mg  200 mg Oral Daily Mills, Shnese E, NP   200 mg at 06/19/23 4098   atorvastatin  (LIPITOR) tablet 20 mg  20 mg Oral Daily Mills, Shnese E, NP   20 mg at 06/19/23 0802   haloperidol  (HALDOL ) tablet 5 mg  5 mg Oral TID PRN Doneen Fuelling, NP       And   diphenhydrAMINE  (BENADRYL ) capsule 50 mg  50 mg Oral TID PRN Mills, Shnese E, NP       haloperidol  lactate (HALDOL ) injection 5 mg  5 mg Intramuscular TID PRN Doneen Fuelling, NP       And   diphenhydrAMINE  (BENADRYL ) injection 50 mg  50 mg Intramuscular TID PRN Doneen Fuelling, NP       And   LORazepam  (ATIVAN ) injection 2 mg  2 mg Intramuscular TID PRN Mills, Shnese E, NP       haloperidol  lactate (HALDOL ) injection 10 mg  10 mg Intramuscular TID PRN Doneen Fuelling, NP       And   diphenhydrAMINE  (BENADRYL ) injection 50 mg  50 mg Intramuscular TID PRN [name], NP       And   LORazepam  (ATIVAN ) injection 2 mg  2 mg Intramuscular TID PRN Mills, Shnese E, NP       divalproex  (DEPAKOTE ) DR tablet 750 mg  750 mg Oral BID Mills, Shnese E, NP   750 mg at 06/19/23 1611   doxepin  (SINEQUAN ) capsule 50 mg   50 mg Oral QHS Mills, Shnese E, NP   50 mg at 06/18/23 2112   escitalopram  (LEXAPRO ) tablet 20 mg  20 mg Oral QHS Mills, Shnese E, NP   20 mg at 06/18/23 2112   feeding supplement (ENSURE ENLIVE / ENSURE PLUS) liquid 237 mL  237 mL Oral TID BM Mills, Shnese E, NP       hydrOXYzine  (ATARAX ) tablet 25 mg  25 mg Oral TID PRN Mills, Shnese E, NP   25 mg at 06/19/23 1611   lurasidone  (LATUDA ) tablet 20 mg  20 mg Oral Q supper Mills, Shnese E, NP   20 mg at 06/19/23 1611   magnesium  hydroxide (MILK OF MAGNESIA) suspension 30 mL  30 mL Oral Daily PRN Mills, Shnese E, NP       metoprolol  succinate (TOPROL -XL) 24 hr tablet 25 mg  25 mg Oral Daily Mills, Shnese E, NP   25 mg at 06/19/23 0803   multivitamin with minerals tablet 1 tablet  1 tablet Oral Daily [name], NP   1 tablet at 06/19/23 0802   prazosin  (MINIPRESS ) capsule 2 mg  2 mg Oral QHS Mills, Shnese E, NP   2 mg at 06/18/23  2112   tamsulosin  (FLOMAX ) capsule 0.4 mg  0.4 mg Oral QPC supper Mills, Shnese E, NP   0.4 mg at 06/19/23 1611   PTA Medications: Medications Prior to Admission  Medication Sig Dispense Refill Last Dose/Taking   amiodarone  (PACERONE ) 200 MG tablet Take 1 tablet (200 mg total) by mouth daily. For cardiac issues. 30 tablet 0    atorvastatin  (LIPITOR) 20 MG tablet Take 1 tablet (20 mg total) by mouth daily. For hyperlipidemia. 30 tablet 0    divalproex  (DEPAKOTE ) 250 MG DR tablet Take 3 tablets (750 mg total) by mouth 2 (two) times daily. For mood stabilization 180 tablet 0    doxepin  (SINEQUAN ) 50 MG capsule Take 1 capsule (50 mg total) by mouth at bedtime. For sleep 30 capsule 0    escitalopram  (LEXAPRO ) 20 MG tablet Take 1 tablet (20 mg total) by mouth at bedtime. For depression 30 tablet 0    feeding supplement (ENSURE ENLIVE / ENSURE PLUS) LIQD Take 237 mLs by mouth 3 (three) times daily between meals. 237 mL 12    hydrOXYzine  (ATARAX ) 25 MG tablet Take 1 tablet (25 mg total) by mouth 3 (three) times daily as needed  for anxiety. 75 tablet 0    lurasidone  (LATUDA ) 20 MG TABS tablet Take 1 tablet (20 mg total) by mouth daily with supper. For mood control 30 tablet 0    melatonin 5 MG TABS Take 1 tablet (5 mg total) by mouth at bedtime. For sleep 30 tablet 0    metoprolol  succinate (TOPROL -XL) 25 MG 24 hr tablet Take 1 tablet (25 mg total) by mouth daily. For high blood pressure. 30 tablet 0    Multiple Vitamin (MULTIVITAMIN WITH MINERALS) TABS tablet Take 1 tablet by mouth daily. 30 tablet 0    prazosin  (MINIPRESS ) 2 MG capsule Take 1 capsule (2 mg total) by mouth at bedtime. For nightmares. 30 capsule 0    tamsulosin  (FLOMAX ) 0.4 MG CAPS capsule Take 1 capsule (0.4 mg total) by mouth daily after supper. For Prostate health 30 capsule 0     Patient Stressors: Financial difficulties   Loss of housing    Patient Strengths: Average or above average intelligence  Capable of independent living  Communication skills  Motivation for treatment/growth   Treatment Modalities: Medication Management, Group therapy, Case management,  1 to 1 session with clinician, Psychoeducation, Recreational therapy.   Physician Treatment Plan for Primary Diagnosis: MDD (major depressive disorder), recurrent episode, severe (HCC) Long Term Goal(s):     Short Term Goals:    Medication Management: Evaluate patient's response, side effects, and tolerance of medication regimen.  Therapeutic Interventions: 1 to 1 sessions, Unit Group sessions and Medication administration.  Evaluation of Outcomes: Not Progressing  Physician Treatment Plan for Secondary Diagnosis: Principal Problem:   MDD (major depressive disorder), recurrent episode, severe (HCC) Active Problems:   PTSD (post-traumatic stress disorder)   Nightmares associated with chronic post-traumatic stress disorder  Long Term Goal(s):     Short Term Goals:       Medication Management: Evaluate patient's response, side effects, and tolerance of medication  regimen.  Therapeutic Interventions: 1 to 1 sessions, Unit Group sessions and Medication administration.  Evaluation of Outcomes: Not Progressing   RN Treatment Plan for Primary Diagnosis: MDD (major depressive disorder), recurrent episode, severe (HCC) Long Term Goal(s): Knowledge of disease and therapeutic regimen to maintain health will improve  Short Term Goals: Ability to remain free from injury will improve, Ability to verbalize frustration and  anger appropriately will improve, Ability to demonstrate self-control, Ability to participate in decision making will improve, Ability to verbalize feelings will improve, Ability to disclose and discuss suicidal ideas, Ability to identify and develop effective coping behaviors will improve, and Compliance with prescribed medications will improve  Medication Management: RN will administer medications as ordered by provider, will assess and evaluate patient's response and provide education to patient for prescribed medication. RN will report any adverse and/or side effects to prescribing provider.  Therapeutic Interventions: 1 on 1 counseling sessions, Psychoeducation, Medication administration, Evaluate responses to treatment, Monitor vital signs and CBGs as ordered, Perform/monitor CIWA, COWS, AIMS and Fall Risk screenings as ordered, Perform wound care treatments as ordered.  Evaluation of Outcomes: Not Progressing   LCSW Treatment Plan for Primary Diagnosis: MDD (major depressive disorder), recurrent episode, severe (HCC) Long Term Goal(s): Safe transition to appropriate next level of care at discharge, Engage patient in therapeutic group addressing interpersonal concerns.  Short Term Goals: Engage patient in aftercare planning with referrals and resources, Increase social support, Increase ability to appropriately verbalize feelings, Increase emotional regulation, Facilitate acceptance of mental health diagnosis and concerns, Facilitate patient  progression through stages of change regarding substance use diagnoses and concerns, Identify triggers associated with mental health/substance abuse issues, and Increase skills for wellness and recovery  Therapeutic Interventions: Assess for all discharge needs, 1 to 1 time with Social worker, Explore available resources and support systems, Assess for adequacy in community support network, Educate family and significant other(s) on suicide prevention, Complete Psychosocial Assessment, Interpersonal group therapy.  Evaluation of Outcomes: Not Progressing   Progress in Treatment: Attending groups: No. Participating in groups: No. Taking medication as prescribed: Yes. Toleration medication: Yes. Family/Significant other contact made: No, will contact:  [facility] Coordinator Patient understands diagnosis: Yes. Discussing patient identified problems/goals with staff: Yes. Medical problems stabilized or resolved: Yes. Denies suicidal/homicidal ideation: Yes. Issues/concerns per patient self-inventory: No. Other: n/a  New problem(s) identified: No, Describe:  None  New Short Term/Long Term Goal(s): medication stabilization, elimination of SI thoughts, development of comprehensive mental wellness plan.   Patient Goals:  "I'm hoping to get into drug rehab"  Discharge Plan or Barriers: Patient recently admitted. CSW will continue to follow and assess for appropriate referrals and possible discharge planning.   Reason for Continuation of Hospitalization: Anxiety Depression Suicidal ideation Other; describe mood stabilization, discharge  planning  Estimated Length of Stay: 5-7 DAYS   Last 3 [place] Suicide Severity Risk Score: Flowsheet Row Ad[facility] (Current[facility]16[facility]2023 [facility] (Discharged[facility]  C-SSRS RISK CATEGORY High Risk High Risk Moderate Risk       Last PHQ 2/9 Scores:     No data to display          Scribe for Treatment Team: Lamika Connolly N Francia Verry, LCSW 06/19/2023 6:30 PM

## 2023-06-19 NOTE — Plan of Care (Signed)
   Problem: Education: Goal: Emotional status will improve Outcome: Progressing Goal: Mental status will improve Outcome: Progressing   Problem: Activity: Goal: Interest or engagement in activities will improve Outcome: Progressing   Problem: Safety: Goal: Periods of time without injury will increase Outcome: Progressing
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
## 2023-06-19 NOTE — Group Note (Signed)
 Recreation Therapy Group Note   Group Topic:Stress Management  Group Date: 06/19/2023 Start Time: 0932 End Time: 1058 Facilitators: Rithika Seel-McCall, LRT,CTRS Location: [address] Hall Dayroom   Group Topic: Stress Management  Goal Area(s) Addresses:  Patient will identify positive stress management techniques. Patient will identify benefits of using stress management post d/c.  Behavioral Response:   Intervention: Calm App  Activity: Meditation. LRT played a meditation for patients called the [facility] meditation. This meditation focused on taking the strength and resilience that mountains represent and bringing them into your daily life.    Education:  Stress Management, Discharge Planning.   Education Outcome: Acknowledges Education   Affect/Mood: N/A   Participation Level: Did not attend    Clinical Observations/Individualized Feedback:     Plan: Continue to engage patient in RT group sessions 2-3x/week.   Moishy Laday-McCall, LRT,CTRS 06/19/2023 1:14 PM

## 2023-06-19 NOTE — Progress Notes (Signed)
   06/19/23 2345  Psych Admission Type (Psych Patients Only)  Admission Status Voluntary  Psychosocial Assessment  Patient Complaints Anxiety  Eye Contact Fair  Facial Expression Anxious  Affect Appropriate to circumstance  Speech Logical/coherent  Interaction Assertive  Motor Activity Slow  Appearance/Hygiene Unremarkable  Behavior Characteristics Appropriate to situation  Mood Depressed  Aggressive Behavior  Effect No apparent injury  Thought Process  Coherency WDL  Content WDL  Delusions WDL  Perception WDL  Hallucination None reported or observed  Judgment Poor  Confusion WDL  Danger to Self  Current suicidal ideation? Denies  Danger to Others  Danger to Others None reported or observed

## 2023-06-19 NOTE — Progress Notes (Signed)
   06/18/23 2300  Psych Admission Type (Psych Patients Only)  Admission Status Voluntary  Psychosocial Assessment  Patient Complaints Anxiety  Eye Contact Fair  Facial Expression Anxious  Affect Appropriate to circumstance  Speech Logical/coherent  Interaction Assertive  Motor Activity Slow  Appearance/Hygiene Unremarkable  Behavior Characteristics Appropriate to situation;Cooperative  Mood Depressed  Thought Process  Coherency WDL  Content WDL  Delusions None reported or observed  Perception WDL  Hallucination None reported or observed  Judgment Poor  Confusion None  Danger to Self  Current suicidal ideation? Denies  Description of Suicide Plan none  Self-Injurious Behavior No self-injurious ideation or behavior indicators observed or expressed   Agreement Not to Harm Self Yes  Description of Agreement verbal  Danger to Others  Danger to Others None reported or observed

## 2023-06-19 NOTE — Group Note (Signed)
 Date:  06/19/2023 Time:  9:09 AM  Group Topic/Focus:  Goals Group:   The focus of this group is to help patients establish daily goals to achieve during treatment and discuss how the patient can incorporate goal setting into their daily lives to aide in recovery.    Participation Level:  Did Not Attend   [name] 06/19/2023, 9:09 AM

## 2023-06-19 NOTE — Progress Notes (Signed)
  [name] Colon [facility]   Type of Note: Transitional Housing  Pt given [facility] water for mens transitional housing in [place]. Pt was encouraged to call tomorrow. Pt agreeable.  Signed:  Kyarah Enamorado, LCSW-A 06/19/2023  3:26 PM

## 2023-06-19 NOTE — Progress Notes (Signed)
   06/19/23 0800  Psych Admission Type (Psych Patients Only)  Admission Status Voluntary  Psychosocial Assessment  Patient Complaints Anxiety  Eye Contact Fair  Facial Expression Anxious  Affect Appropriate to circumstance  Speech Logical/coherent  Interaction Assertive  Motor Activity Slow  Appearance/Hygiene Unremarkable  Behavior Characteristics Appropriate to situation  Mood Depressed  Thought Process  Coherency WDL  Content WDL  Delusions None reported or observed  Perception WDL  Hallucination None reported or observed  Judgment Poor  Confusion None  Danger to Self  Current suicidal ideation? Denies  Agreement Not to Harm Self Yes  Description of Agreement Verbal  Danger to Others  Danger to Others None reported or observed
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
## 2023-06-20 DIAGNOSIS — F332 Major depressive disorder, recurrent severe without psychotic features: Secondary | ICD-10-CM | POA: Diagnosis not present

## 2023-06-20 LAB — HIV ANTIBODY (ROUTINE TESTING W REFLEX): HIV Screen 4th Generation wRfx: NONREACTIVE

## 2023-06-20 LAB — VITAMIN D 25 HYDROXY (VIT D DEFICIENCY, FRACTURES): Vit D, 25-Hydroxy: 31.64 ng/mL (ref 30–100)

## 2023-06-20 LAB — RPR: RPR Ser Ql: NONREACTIVE

## 2023-06-20 MED ORDER — DOXEPIN HCL 25 MG PO CAPS
75.0000 mg | ORAL_CAPSULE | Freq: Every day | ORAL | Status: DC
  Administered 2023-06-20: 75 mg via ORAL
  Filled 2023-06-20: qty 3

## 2023-06-20 MED ORDER — ESCITALOPRAM OXALATE 10 MG PO TABS
10.0000 mg | ORAL_TABLET | Freq: Every day | ORAL | Status: DC
  Administered 2023-06-20: 10 mg via ORAL
  Filled 2023-06-20: qty 1

## 2023-06-20 NOTE — Plan of Care (Addendum)
 Pt observed in bed with flat affect, depressed mood, brief eye contact and logical speech. Rates his anxiety and depression both 10/10, anger 0/10 with stressor being homelessness. Reports +AH "The voices tells me to kill myself" but denies VH. Reports trouble staying asleep last night "I kept getting up". Pt reports poor appetite, declined breakfast, cereals when offered. However, he ate lunch and dinner, tolerated it well. Pt did not attend scheduled groups despite multiple prompts. Emotional support, reassurance and encouragement offered. Safety checks maintained at Q 15 minutes intervals without incident.   Problem: Activity: Goal: Sleeping patterns will improve Outcome: Progressing   Problem: Activity: Goal: Interest or engagement in activities will improve Outcome: Progressing   Problem: Coping: Goal: Ability to demonstrate self-control will improve Outcome: Progressing

## 2023-06-20 NOTE — Group Note (Signed)
 Recreation Therapy Group Note   Group Topic:Animal Assisted Therapy   Group Date: 06/20/2023 Start Time: 0946 End Time: 1030 Facilitators: Amiria Orrison-McCall, LRT,CTRS Location: [address] Dayroom   Animal-Assisted Activity (AAA) Program Checklist/Progress Notes Patient Eligibility Criteria Checklist & Daily Group note for Rec Tx Intervention  AAA/T Program Assumption of Risk Form signed by Patient/ or Parent Legal Guardian Yes  Patient understands his/her participation is voluntary Yes  Behavioral Response:    Education: Charity fundraiser, Appropriate Animal Interaction   Education Outcome: Acknowledges education.    Affect/Mood: N/A   Participation Level: Did not attend    Clinical Observations/Individualized Feedback:     Plan: Continue to engage patient in RT group sessions 2-3x/week.   Lecia Esperanza-McCall, LRT,CTRS 06/20/2023 1:19 PM

## 2023-06-20 NOTE — Progress Notes (Signed)
   06/20/23 2230  Psych Admission Type (Psych Patients Only)  Admission Status Voluntary  Psychosocial Assessment  Patient Complaints Depression  Eye Contact Fair  Facial Expression Anxious  Affect Appropriate to circumstance  Speech Logical/coherent  Interaction Assertive  Motor Activity Slow  Appearance/Hygiene Unremarkable  Behavior Characteristics Cooperative  Mood Depressed;Pleasant  Aggressive Behavior  Effect No apparent injury  Thought Process  Coherency WDL  Content WDL  Delusions WDL  Perception WDL  Hallucination None reported or observed  Judgment Poor  Confusion WDL  Danger to Self  Current suicidal ideation? Denies  Danger to Others  Danger to Others None reported or observed

## 2023-06-20 NOTE — Plan of Care (Signed)
   Problem: Education: Goal: Emotional status will improve Outcome: Progressing Goal: Mental status will improve Outcome: Progressing   Problem: Activity: Goal: Interest or engagement in activities will improve Outcome: Progressing Goal: Sleeping patterns will improve Outcome: Progressing   Problem: Safety: Goal: Periods of time without injury will increase Outcome: Progressing

## 2023-06-20 NOTE — Progress Notes (Signed)
  [name]   Type of Note: Discharge planning  [facility] called [facility], reports his check is ~$30.00 less than what is required to stay there. States even if the owner allowed him to just give the check, he is not willing to do so.   Pt was given [facility] vacancy list and [facility] information and was encouraged to call to set up interviews/complete phone screen. Pt agreeable and verbalized understanding.   Will continue to assist as needed.  Signed:  Krissy Orebaugh, LCSW-A 06/20/2023  2:50 PM

## 2023-06-20 NOTE — Progress Notes (Signed)
 [facility] MD Progress Note  06/20/2023 1:40 PM Kaleb Sek  MRN:  [id number] Principal Problem: MDD (major depressive disorder), recurrent episode, severe (HCC) Diagnosis: Principal Problem:   MDD (major depressive disorder), recurrent episode, severe (HCC) Active Problems:   PTSD (post-traumatic stress disorder)   Nightmares associated with chronic post-traumatic stress disorder   ID & Admission Information: Shellie Colon Doswell is an 63 y.o. 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  He presented on 06/18/2023  5:47 PM for MDD (major depressive disorder), recurrent episode, severe (HCC).  He presented with suicidal ideation and plan to hang himself.  Subjective:   Case was discussed in the multidisciplinary team. MAR was reviewed and patient was compliant with medications.   PRN's in last 24 hours: Tylenol  650 mg administered at 0858  Zander was seen in his room during rounds.  He reported poor sleep related primarily to Bentyl  insomnia.  He continues to report 10 of 10 depression and continues to endorse suicidal ideation with a plan to hang himself.  He tells me that if he were to leave the hospital today he would commit suicide.  He continues to report high anxiety.  He states that his appetite is decreased.  He has largely been isolating to his room.  He states that he reached out to an option for drug treatment, but states that they want $900 a month which is his entire disability check, so he was not willing to do it.  We discussed decreasing Lexapro  to 20 mg nightly and increasing doxepin  to 75 mg nightly.  Will plan on  checking an EKG in the morning to make sure the QT interval is not increasing significantly.   Past Psychiatric and Medical Medical History:  Past Medical History:  Diagnosis Date   Anxiety    Arthritis    knees and hands   Bipolar 1 disorder, depressed (HCC)    Bipolar disorder (HCC)    Depression    GERD (gastroesophageal reflux disease)    Hepatitis    HEP "C"   History of kidney stones    Hypertension    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 echo 5/17: EF 60-65%, no RWMA, LV dias fxn nl, LA mildly dilated, RV sys fxn nl, PASP nl, moderate sized circumferential pericardial effusion was identified, 2.12 cm around the LV free wall, <1 cm around the RV free wall. Features were not c/w tamponade physiology   PTSD (post-traumatic stress disorder)    Witnessed brother's suicide.   Restless leg syndrome    Seizures (HCC)    Syncope     Past Surgical History:  Procedure Laterality Date   APPENDECTOMY     CARDIOVERSION N/A 04/17/2023   Procedure: CARDIOVERSION;  Surgeon: Constancia Delton, MD;  Location: [facility] ORS;  Service: Cardiovascular;  Laterality: N/A;   CYSTOSCOPY WITH URETEROSCOPY AND STENT PLACEMENT     ESOPHAGOGASTRODUODENOSCOPY N/A 01/11/2016   Procedure: ESOPHAGOGASTRODUODENOSCOPY (EGD);  Surgeon: Baldo Bonds, MD;  Location: [facility] ENDOSCOPY;  Service: Endoscopy;  Laterality: N/A;   ESOPHAGOGASTRODUODENOSCOPY N/A 04/09/2020   Procedure: ESOPHAGOGASTRODUODENOSCOPY (EGD);  Surgeon: Luke Salaam, MD;  Location: [facility] ENDOSCOPY;  Service: Gastroenterology;  Laterality:  N/A;   INCISION AND DRAINAGE ABSCESS Left 01/02/2018   Procedure: INCISION AND DRAINAGE LEFT KNEE;  Surgeon: Marlynn Singer, MD;  Location: [facility] ORS;  Service: Orthopedics;  Laterality: Left;   JOINT REPLACEMENT Right    TKR   KNEE ARTHROSCOPY Right 06/25/2014   Procedure: ARTHROSCOPY KNEE;  Surgeon: Marlynn Singer, MD;  Location: [facility] ORS;   Service: Orthopedics;  Laterality: Right;  partial arthroscopic medial menisectomy   LAPAROSCOPIC APPENDECTOMY N/A 06/02/2021   Procedure: APPENDECTOMY LAPAROSCOPIC;  Surgeon: Flynn Hylan, MD;  Location: [facility] ORS;  Service: General;  Laterality: N/A;   TEE WITHOUT CARDIOVERSION N/A 04/17/2023   Procedure: ECHOCARDIOGRAM, TRANSESOPHAGEAL;  Surgeon: Constancia Delton, MD;  Location: [facility] ORS;  Service: Cardiovascular;  Laterality: N/A;   TOTAL KNEE ARTHROPLASTY Right 04/22/2015   Procedure: TOTAL KNEE ARTHROPLASTY;  Surgeon: Marlynn Singer, MD;  Location: [facility] ORS;  Service: Orthopedics;  Laterality: Right;   TOTAL KNEE ARTHROPLASTY Left 10/30/2017   Procedure: TOTAL KNEE ARTHROPLASTY;  Surgeon: Marlynn Singer, MD;  Location: [facility] ORS;  Service: Orthopedics;  Laterality: Left;   TOTAL KNEE REVISION Left 01/02/2018   Procedure: poly exchange of tibia and patella left knee;  Surgeon: Marlynn Singer, MD;  Location: [facility] ORS;  Service: Orthopedics;  Laterality: Left;   UMBILICAL HERNIA REPAIR  06/02/2021   Procedure: HERNIA REPAIR UMBILICAL ADULT;  Surgeon: Flynn Hylan, MD;  Location: [facility] ORS;  Service: General;;    Family History(Medical and Psychiatric):  Family History  Problem Relation Age of Onset   CVA Mother        deceased at age 9   Depression Brother        Died by suicide at age 56   Social History:  Social History   Substance and Sexual Activity  Alcohol Use Not Currently   Comment: rare     Social History   Substance and Sexual Activity  Drug Use Not Currently   Types: Marijuana, Cocaine   Comment: last use January 2025, cocaine    Social History   Socioeconomic History   Marital status: Single    Spouse name: Not on file   Number of children: Not on file   Years of education: Not on file   Highest education level: Not on file  Occupational History   Not on file  Tobacco Use   Smoking status: Former    Current packs/day: 0.00    Average packs/day:  0.8 packs/day for 20.0 years (15.0 ttl pk-yrs)    Types: Cigarettes    Start date: 05/16/1964    Quit date: 05/16/1984    Years since quitting: 39.1   Smokeless tobacco: Never  Vaping Use   Vaping status: Never Used  Substance and Sexual Activity   Alcohol use: Not Currently    Comment: rare   Drug use: Not Currently    Types: Marijuana, Cocaine    Comment: last use January 2025, cocaine   Sexual activity: Not Currently  Other Topics Concern   Not on file  Social History Narrative   ** Merged History Encounter **       Social Drivers of Health   Financial Resource Strain: Not on file  Food Insecurity: Food Insecurity Present (06/18/2023)   Hunger Vital Sign    Worried About Running Out of Food in the Last Year: Often true    Ran Out of Food in the Last Year: Often true  Transportation Needs: No Transportation Needs (06/18/2023)   PRAPARE - Transportation    Lack of Transportation (  Medical): No    Lack of Transportation (Non-Medical): No  Physical Activity: Not on file  Stress: Not on file  Social Connections: Socially Isolated (02/15/2023)   Social Connection and Isolation Panel [NHANES]    Frequency of Communication with Friends and Family: Once a week    Frequency of Social Gatherings with Friends and Family: Once a week    Attends Religious Services: Never    Database administrator or Organizations: No    Attends Engineer, structural: Never    Marital Status: Never married        Current Medications: Current Facility-Administered Medications  Medication Dose Route Frequency Provider Last Rate Last Admin   acetaminophen  (TYLENOL ) tablet 650 mg  650 mg Oral Q6H PRN Mills, Shnese E, NP   650 mg at 06/20/23 0858   alum & mag hydroxide-simeth (MAALOX/MYLANTA) 200-200-20 MG/5ML suspension 30 mL  30 mL Oral Q4H PRN Mills, Shnese E, NP       amiodarone  (PACERONE ) tablet 200 mg  200 mg Oral Daily Zouev, Dmitri, MD   200 mg at 06/20/23 0858   atorvastatin  (LIPITOR) tablet  20 mg  20 mg Oral Daily Mills, Shnese E, NP   20 mg at 06/20/23 8657   haloperidol  (HALDOL ) tablet 5 mg  5 mg Oral TID PRN Doneen Fuelling, NP       And   diphenhydrAMINE  (BENADRYL ) capsule 50 mg  50 mg Oral TID PRN Mills, Shnese E, NP       haloperidol  lactate (HALDOL ) injection 5 mg  5 mg Intramuscular TID PRN Doneen Fuelling, NP       And   diphenhydrAMINE  (BENADRYL ) injection 50 mg  50 mg Intramuscular TID PRN Doneen Fuelling, NP       And   LORazepam  (ATIVAN ) injection 2 mg  2 mg Intramuscular TID PRN Mills, Shnese E, NP       haloperidol  lactate (HALDOL ) injection 10 mg  10 mg Intramuscular TID PRN Doneen Fuelling, NP       And   diphenhydrAMINE  (BENADRYL ) injection 50 mg  50 mg Intramuscular TID PRN [name], NP       And   LORazepam  (ATIVAN ) injection 2 mg  2 mg Intramuscular TID PRN Mills, Shnese E, NP       divalproex  (DEPAKOTE ) DR tablet 750 mg  750 mg Oral BID Mills, Shnese E, NP   750 mg at 06/20/23 0859   doxepin  (SINEQUAN ) capsule 50 mg  50 mg Oral QHS Mills, Shnese E, NP   50 mg at 06/19/23 2109   escitalopram  (LEXAPRO ) tablet 20 mg  20 mg Oral QHS Mills, Shnese E, NP   20 mg at 06/19/23 2109   hydrOXYzine  (ATARAX ) tablet 25 mg  25 mg Oral TID PRN Mills, Shnese E, NP   25 mg at 06/19/23 1611   lurasidone  (LATUDA ) tablet 20 mg  20 mg Oral Q supper Mills, Shnese E, NP   20 mg at 06/19/23 1611   magnesium  hydroxide (MILK OF MAGNESIA) suspension 30 mL  30 mL Oral Daily PRN Mills, Shnese E, NP       metoprolol  succinate (TOPROL -XL) 24 hr tablet 25 mg  25 mg Oral Daily Mills, Shnese E, NP   25 mg at 06/20/23 0857   multivitamin with minerals tablet 1 tablet  1 tablet Oral Daily [name], NP   1 tablet at 06/20/23 0859   prazosin  (MINIPRESS ) capsule 2 mg  2 mg  Oral QHS Mills, Shnese E, NP   2 mg at 06/19/23 2109   tamsulosin  (FLOMAX ) capsule 0.4 mg  0.4 mg Oral QPC supper Mills, Shnese E, NP   0.4 mg at 06/19/23 1611    Lab Results:  Results for orders placed or  performed during the hospital encounter of 06/18/23 (from the past 48 hours)  Folate     Status: None   Collection Time: 06/19/23  6:32 PM  Result Value Ref Range   Folate 15.9 >5.9 ng/mL    Comment: Performed at [facility], [address], [place], [place] [address]  Vitamin B12     Status: None   Collection Time: 06/19/23  6:32 PM  Result Value Ref Range   Vitamin B-12 430 180 - 914 pg/mL    Comment: (NOTE) This assay is not validated for testing neonatal or myeloproliferative syndrome specimens for Vitamin B12 levels. Performed at [facility], [address] [place], [place] [address]   VITAMIN D 25 Hydroxy (Vit-D Deficiency, Fractures)     Status: None   Collection Time: 06/19/23  6:32 PM  Result Value Ref Range   Vit D, 25-Hydroxy 31.64 30 - 100 ng/mL    Comment: (NOTE) Vitamin D deficiency has been defined by the Institute of Medicine  and an Endocrine Society practice guideline as a level of serum 25-OH  vitamin D less than 20 ng/mL (1,2). The Endocrine Society went on to  further define vitamin D insufficiency as a level between 21 and 29  ng/mL (2).  1. IOM (Institute of Medicine). 2010. Dietary reference intakes for  calcium  and D. [place]: [employer]. 2. Holick MF, Binkley [place], Bischoff-Ferrari HA, et al. Evaluation,  treatment, and prevention of vitamin D deficiency: an Endocrine  Society clinical practice guideline, JCEM. 2011 Jul; 96(7): 1911-30.  Performed at [facility], [address] [place], [place] [address]   RPR     Status: None   Collection Time: 06/19/23  6:32 PM  Result Value Ref Range   RPR Ser Ql NON REACTIVE NON REACTIVE    Comment: Performed at [facility], [address] [place], [place] [address]  HIV Antibody (routine testing w rflx)     Status: None   Collection Time: 06/19/23  6:32 PM  Result Value Ref Range   HIV Screen 4th Generation wRfx Non Reactive Non  Reactive    Comment: Performed at [facility], [address] [place], [place] [address]    Blood Alcohol level:  Lab Results  Component Value Date   [facility] <15 05/29/2023   ETH <10 04/12/2023    Metabolic Disorder Labs: Lab Results  Component Value Date   HGBA1C 4.9 06/01/2023   MPG 93.93 06/01/2023   MPG 111.15 12/29/2022   No results found for: "PROLACTIN" Lab Results  Component Value Date   CHOL 246 (H) 06/01/2023   TRIG 129 06/01/2023   HDL 37 (L) 06/01/2023   CHOLHDL 6.6 06/01/2023   VLDL 26 06/01/2023   LDLCALC 183 (H) 06/01/2023   LDLCALC 146 (H) 12/29/2022    Physical Findings: AIMS:  , ,  ,  ,    CIWA:    COWS:     Psychiatric Specialty Exam:  Presentation  General Appearance: Disheveled  Eye Contact: Fair  Speech: Clear and Coherent  Speech Volume: Normal  Handedness: Right   Mood and Affect  Mood: Dysphoric; Anxious  Affect: Restricted; Congruent  Thought Process  Thought Processes: Linear  Descriptions of Associations: Intact  Orientation: Partial  Thought Content: Logical  History of Schizophrenia/Schizoaffective disorder: No  Duration of Psychotic Symptoms: NA Hallucinations: Hallucinations: None  Ideas of Reference: None  Suicidal Thoughts: Suicidal Thoughts: Yes, Active SI Active Intent and/or Plan: With Plan  Homicidal Thoughts: Homicidal Thoughts: No   Sensorium  Memory: Immediate Good; Recent Good  Judgment: Poor  Insight: Fair   Art therapist  Concentration: Good  Attention Span: Good  Recall: Good  Fund of Knowledge: Good  Language: Good   Psychomotor Activity  Psychomotor Activity: Psychomotor Activity: Psychomotor Retardation   Assets  Assets: Communication Skills; Resilience   Sleep  Sleep: Sleep: Poor   Musculoskeletal: Strength & Muscle Tone: within normal limits Gait & Station: normal Patient leans: N/A   Physical Exam: General: Sitting comfortably. NAD. HEENT:  Normocephalic, atraumatic, MMM, EMOI Lungs: no increased work of breathing noted Heart: no cyanosis Abdomen: Non distended Musculoskeletal: FROM. No obvious deformities Skin: Warm, dry, intact. No rashes noted Neuro: No obvious focal deficits.  Gait and station are normal  Review of Systems  Constitutional: Negative.   HENT: Negative.    Eyes: Negative.   Respiratory: Negative.    Cardiovascular: Negative.   Gastrointestinal: Negative.   Genitourinary: Negative.   Skin: Negative.   Neurological: Negative.   Psychiatric/Behavioral:  Positive for depression, anxiety, insomnia.     Blood pressure 119/81, pulse 73, temperature 97.8 F (36.6 C), temperature source Oral, resp. rate 17, height 5\' 9"  (1.753 m), weight 82.6 kg, SpO2 97%. Body mass index is 26.88 kg/m.  ASSESSMENT: [name] is an 63 y.o. 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  He presented on 06/18/2023  5:47 PM for MDD (major depressive disorder), recurrent episode, severe (HCC).  He presented with suicidal ideation and plan to hang himself.  He wants treatment for cocaine abuse.  Diagnoses / Active Problems: Patient Active Problem List   Diagnosis Date Noted   Nightmares associated with chronic post-traumatic stress disorder 06/19/2023   MDD (major depressive disorder), recurrent episode, severe (HCC) 06/18/2023   Severe recurrent major depression with psychotic features (HCC) 06/16/2023   Bipolar 1 disorder, depressed (HCC) 05/30/2023   Atrial fibrillation with rapid ventricular response (HCC) 04/15/2023   Primary hypertension 04/15/2023    Syncope 04/12/2023   Seizure (HCC) 04/12/2023   Persistent atrial fibrillation (HCC) 04/11/2023   Seizures (HCC) 02/13/2023   Transaminitis 02/13/2023   Polysubstance abuse (HCC) 02/13/2023   Sinus tachycardia 02/13/2023   Rhabdomyolysis 02/12/2023   Mild tetrahydrocannabinol (THC) abuse 12/24/2022   Bipolar disorder, current episode depressed, severe, with psychotic features (HCC) 12/24/2022   Atrial flutter (HCC) 12/21/2022   SVT (supraventricular tachycardia) (HCC) 12/20/2022   PTSD (post-traumatic stress disorder) 09/16/2022   Suicidal ideation 09/15/2022   Malingering 06/28/2021   Cocaine use    Moderate episode of recurrent major depressive disorder (HCC) 05/15/2021   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[facility]) 05/03/2021   Essential hypertension 01/18/2021   Alcohol abuse 01/18/2021   Coronary artery disease of native artery of native heart with stable angina pectoris (HCC)       PLAN: Safety and Monitoring:  -- Voluntary admission to inpatient psychiatric unit  for safety, stabilization and treatment  -- Daily contact with patient to assess and evaluate symptoms and progress in treatment  -- Patient's case to be discussed in multi-disciplinary team meeting  -- Observation Level : q15 minute checks  -- Vital signs:  q12 hours  -- Precautions: suicide, elopement, and assault  2. Psychiatric Diagnoses and Treatment:  Patient Active Problem List   Diagnosis Date Noted   Nightmares associated with chronic post-traumatic stress disorder 06/19/2023   MDD (major depressive disorder), recurrent episode, severe (HCC) 06/18/2023   Severe recurrent major depression with psychotic features (HCC) 06/16/2023   Bipolar 1 disorder, depressed (HCC) 05/30/2023   Atrial fibrillation with rapid ventricular response (HCC) 04/15/2023   Primary hypertension 04/15/2023   Syncope 04/12/2023   Seizure (HCC) 04/12/2023   Persistent atrial fibrillation (HCC) 04/11/2023   Seizures (HCC)  02/13/2023   Transaminitis 02/13/2023   Polysubstance abuse (HCC) 02/13/2023   Sinus tachycardia 02/13/2023   Rhabdomyolysis 02/12/2023   Mild tetrahydrocannabinol (THC) abuse 12/24/2022   Bipolar disorder, current episode depressed, severe, with psychotic features (HCC) 12/24/2022   Atrial flutter (HCC) 12/21/2022   SVT (supraventricular tachycardia) (HCC) 12/20/2022   PTSD (post-traumatic stress disorder) 09/16/2022   Suicidal ideation 09/15/2022   Malingering 06/28/2021   Cocaine use    Moderate episode of recurrent major depressive disorder (HCC) 05/15/2021   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[facility]) 05/03/2021   Essential hypertension 01/18/2021   Alcohol abuse 01/18/2021   Coronary artery disease of native artery of native heart with stable angina pectoris (HCC)      Scheduled Medications:  amiodarone   200 mg Oral Daily   atorvastatin   20 mg Oral Daily   divalproex   750 mg Oral BID   doxepin   50 mg Oral QHS   escitalopram   20 mg Oral QHS   lurasidone   20 mg Oral Q supper   metoprolol  succinate  25 mg Oral Daily   multivitamin with minerals  1 tablet Oral Daily   prazosin   2 mg Oral QHS   tamsulosin   0.4 mg Oral QPC supper    As Needed Medications: acetaminophen , alum & mag hydroxide-simeth, haloperidol  **AND** diphenhydrAMINE , haloperidol  lactate **AND** diphenhydrAMINE  **AND** LORazepam , haloperidol  lactate **AND** diphenhydrAMINE  **AND** LORazepam , hydrOXYzine , magnesium  hydroxide    3. Medical Issues Being Addressed:   -- As above  Labs reviewed, unremarkable with the exception of: Hypocalcemia 8.8, elevated CK4 25, hypercholesterolemia to 46 with LDL of 183, anemia 12.5  4. Discharge Planning:   -- Social work and case management to assist with discharge planning and identification of hospital follow-up needs prior to discharge  -- Estimated LOS: 7 days  -- Discharge Concerns: Need to establish a safety plan; Medication compliance and effectiveness  --  Discharge Goals: Return home with outpatient referrals for mental health follow-up including medication management/psychotherapy  5. Short Term Goals:  Improve ability to identify changes in lifestyle to reduce recurrence of condition, verbalize feelings, disclose and discuss suicidal ideas, demonstrate self-control, identify and develop effective coping behaviors, compliance with prescribed medications, identify triggers associated with substance abuse/mental health issues, participate in unit milieu and in scheduled group therapies   6. Long Term Goals: Improvement in symptoms so the patient is ready for discharge   --The risks/benefits/side-effects/alternatives to the medications above were discussed in detail with the patient and time was given for questions. The patient provided informed consent.   -- Metabolic profile and EKG monitoring obtained while on an atypical antipsychotic and listed in the EHR  Total Time Spent in Direct Patient Care:  I personally spent 35 minutes on the unit in direct patient care. The direct patient care time included face-to-face time with the patient, reviewing the patient's chart, communicating with other professionals, and coordinating care. Greater than 50% of this time was spent in counseling or coordinating care with the patient regarding goals of hospitalization, psycho-education, and discharge planning needs.      Clair Crews, MD Psychiatrist  06/20/2023, 1:40 PM   I certify that inpatient services furnished can reasonably be expected to improve the patient's condition.    Portions of this note were created using voice recognition software. Minor syntax errors, grammatical content, spelling, or punctuation errors may have occurred unintentionally. Please notify the [facility] if the meaning of any statement is unclear.

## 2023-06-20 NOTE — Group Note (Signed)
 LCSW Group Therapy Note   Group Date: 06/20/2023 Start Time: 1100 End Time: 1200   Participation:  did not attend  Type of Therapy:  Group Therapy  Topic:  Speaking from the Heart: Communicating with Understanding and Empathy  Objective:  To help participants develop effective communication skills to express themselves clearly, listen actively, and navigate conflicts in a healthy way.  Goals: Increase awareness of verbal and non-verbal communication skills. Practice using "I" statements and active listening techniques. Learn coping strategies for managing communication stress.  Summary:  Participants explored the importance of communication, discussed challenges, and practiced skills such as active listening and assertive expression. They reflected on past experiences and identified ways to improve communication in their daily lives.  Therapeutic Modalities: Cognitive-Behavioral Therapy (CBT): Restructuring negative thought patterns in communication. Mindfulness: Staying present and calm during conversations. Psychoeducation: Learning about effective communication techniques.   Dantavious Snowball O Ellianna Ruest, LCSWA 06/20/2023  5:53 PM

## 2023-06-20 NOTE — Progress Notes (Signed)
 Pt has a Care Coord thru [employer]- Katelyn Murray [phone] and has CST thru [facility]- Team Lead Jenelle Mis [phone].

## 2023-06-20 NOTE — Group Note (Signed)
 Date:  06/20/2023 Time:  9:08 AM  Group Topic/Focus:  Goals Group:   The focus of this group is to help patients establish daily goals to achieve during treatment and discuss how the patient can incorporate goal setting into their daily lives to aide in recovery. Orientation:   The focus of this group is to educate the patient on the purpose and policies of crisis stabilization and provide a format to answer questions about their admission.  The group details unit policies and expectations of patients while admitted.    Participation Level:  Did Not Attend

## 2023-06-21 DIAGNOSIS — F332 Major depressive disorder, recurrent severe without psychotic features: Secondary | ICD-10-CM | POA: Diagnosis not present

## 2023-06-21 MED ORDER — DOXEPIN HCL 25 MG PO CAPS
100.0000 mg | ORAL_CAPSULE | Freq: Every day | ORAL | Status: DC
  Administered 2023-06-21: 100 mg via ORAL
  Filled 2023-06-21: qty 4

## 2023-06-21 MED ORDER — TOPIRAMATE 25 MG PO TABS
25.0000 mg | ORAL_TABLET | Freq: Every day | ORAL | Status: DC
  Administered 2023-06-21: 25 mg via ORAL
  Filled 2023-06-21: qty 1

## 2023-06-21 MED ORDER — ATORVASTATIN CALCIUM 40 MG PO TABS
40.0000 mg | ORAL_TABLET | Freq: Every day | ORAL | Status: DC
  Administered 2023-06-22 – 2023-07-04 (×13): 40 mg via ORAL
  Filled 2023-06-21 (×13): qty 1
  Filled 2023-06-21: qty 14

## 2023-06-21 NOTE — [facility] Group Notes (Signed)
 Spirituality Group   Focus of discussion: Gratitude and Strength Awareness   Process: Following theoretical framework of group therapy of Irvin Yalom and further informed by Rogerian and Relational Cultural Theory approaches, participants invited to name:   Sources of gratitude (internal>external)   Articulate gratitude for self   Name a personal strength/gift/skill   Locate points of resonance among group members/engage the here and now   Conclude with grounding/breathwork       Observations: [name] was present for the latter part of group. He was reserved but seemed to be passively engaged in the conversation.  [name], M.Div [phone]

## 2023-06-21 NOTE — Progress Notes (Signed)
  [name]   Type of Note: Collateral/Discharge  Spoke with [name] who is the team lead for CST at [facility] in [place], [place]. Pt is newly added to their CST (transferred from another agency). [name] states her and team will work on trying to find housing/shelter placement for pt when he discharges. They are also referring pt to both [facility] in [place] and [facility] in [place] for ACTT because patient is continuing to have hospitalizations while on CST.   Until transition to ACTT happens, patient will continue with CST and appointments for therapy and medication management will be at:   [facility] [address] [place], [place].  Office #: [phone] [name] #: [phone]  [name] will call this writer back Thursday or Friday regarding dispo progress.   Signed:  Keari Miu, LCSW-A 06/21/2023  10:18 AM

## 2023-06-21 NOTE — [facility] Group Notes (Signed)
 [facility] Group Notes:  (Nursing/MHT/Case Management/Adjunct)  Date:  06/21/2023  Time:  8:39 PM  Type of Therapy:  NA Group  Participation Level:  Active  Participation Quality:  Appropriate  Affect:  Appropriate  Cognitive:  Appropriate  Insight:  Appropriate  Engagement in Group:  Engaged  Modes of Intervention:  Education  Summary of Progress/Problems:Attended NA meeting.  [name] 06/21/2023, 8:39 PM

## 2023-06-21 NOTE — Progress Notes (Signed)
   06/21/23 2015  Psych Admission Type (Psych Patients Only)  Admission Status Voluntary  Psychosocial Assessment  Patient Complaints Anxiety;Depression  Eye Contact Fair  Facial Expression Flat;Worried  Affect Anxious  Speech Logical/coherent  Interaction Assertive  Motor Activity Slow  Appearance/Hygiene Unremarkable  Behavior Characteristics Cooperative  Mood Depressed;Pleasant  Aggressive Behavior  Effect No apparent injury  Thought Process  Coherency Circumstantial  Content Blaming self  Delusions WDL  Perception Hallucinations  Hallucination Auditory  Judgment WDL  Confusion WDL  Danger to Self  Current suicidal ideation? Denies  Danger to Others  Danger to Others None reported or observed

## 2023-06-21 NOTE — Group Note (Signed)
 Recreation Therapy Group Note   Group Topic:Team Building  Group Date: 06/21/2023 Start Time: 0945 End Time: 1005 Facilitators: Mercer Stallworth-McCall, LRT,CTRS Location: [address] Dayroom   Group Topic: Communication, Team Building, Problem Solving  Goal Area(s) Addresses:  Patient will effectively work with peer towards shared goal.  Patient will identify skills used to make activity successful.  Patient will identify how skills used during activity can be used to reach post d/c goals.   Behavioral Response: Engaged  Intervention: STEM Activity  Activity: Stage manager. In teams of 3-5, patients were given 12 plastic drinking straws and an equal length of masking tape. Using the materials provided, patients were asked to build a landing pad to catch a golf ball dropped from approximately 5 feet in the air. All materials were required to be used by the team in their design. LRT facilitated post-activity discussion.  Education: Pharmacist, community, Scientist, physiological, Discharge Planning   Education Outcome: Acknowledges education/In group clarification offered/Needs additional education.    Affect/Mood: Appropriate   Participation Level: Engaged   Participation Quality: Independent   Behavior: Appropriate   Speech/Thought Process: Focused   Insight: Good   Judgement: Good   Modes of Intervention: STEM Activity   Patient Response to Interventions:  Engaged   Education Outcome:  In group clarification offered    Clinical Observations/Individualized Feedback: Pt attended and participated in group session.      Plan: Continue to engage patient in RT group sessions 2-3x/week.   Sherlynn Tourville-McCall, LRT,CTRS 06/21/2023 1:32 PM

## 2023-06-21 NOTE — Group Note (Signed)
 Date:  06/21/2023 Time:  5:18 AM  Group Topic/Focus:  Wrap-Up Group:   The focus of this group is to help patients review their daily goal of treatment and discuss progress on daily workbooks.    Participation Level:  Minimal  Participation Quality:  Appropriate and Sharing  Affect:  Appropriate and Flat  Cognitive:  Appropriate  Insight: Appropriate and Limited  Engagement in Group:  Engaged  Modes of Intervention:  Socialization  Additional Comments:  Patient used wrap up group sheet as guide when sharing. Patient rated his day a 8 out of 10. Patient goal for today, get out of bed and patient did achieve goal, yes. Coping skills the patient finds most helpful, deep breathing. Something the patient likes about himself, patient stated nothing at the moment.  Patient did not have any questions or concerns.   Dillard Frame 06/21/2023, 5:18 AM

## 2023-06-21 NOTE — Plan of Care (Signed)
   Problem: Education: Goal: Emotional status will improve Outcome: Progressing Goal: Mental status will improve Outcome: Progressing   Problem: Activity: Goal: Interest or engagement in activities will improve Outcome: Progressing Goal: Sleeping patterns will improve Outcome: Progressing

## 2023-06-21 NOTE — [facility] Counselor (Signed)
 CSW spoke with Pt on the unit for purposes of care coordination and discharge planning. Pt made aware that SW team was notified of his request to find telephone number for his probation officer. He had minimal identifying information, only a last name and county in which he is ben supervised. He stated that her name is Officer S. Ethyl Hering w/ [facility].   Discussed with Pt his efforts in establishing discharge plans. He stated that he spoke with [facility] prompting his need to find contact information for his PO; he states he would need consent prior to acceptance. Pt was provided information for [facility] as well as an [employer] however at the time of speaking with CSW he had not reached out to those additional resources.  CSW team will continue to follow; discussed with CSW Ardean Beat.   Yazlin Ekblad N Theophile Harvie, LCSW 06/21/23 6:41 PM

## 2023-06-21 NOTE — Plan of Care (Addendum)
 Pt presents with flat affect, depressed mood, fair eye contact and logical speech. Denies HI and AVH. Continues to endorse SI with plant To hang myself with a bed sheet when assessed earlier. Verbally agreed to contact staff if urge persist. Received PRN Vistaril  25 mg PO for anxiety 10/10 with desired effect when reassessed at 0920. Rates his depression 10/10 as well related to stressors of poor family support and homelessness. Reports fair sleep last night the kept leaving my door open after checking on me, so it was ok. PRN Tylenol  given also for c/o headache 8/10 with desired effect when reassessed at 1820. Visible in scheduled groups, engaged; off unit for meals and gym, returned without issues. Remains medication compliant. Denies adverse drug reactions. Safety checks maintained at Q 15 minutes intervals without issues.    Problem: Coping: Goal: Ability to demonstrate self-control will improve Outcome: Progressing   Problem: Medication: Goal: Compliance with prescribed medication regimen will improve Outcome: Progressing   Problem: Safety: Goal: Periods of time without injury will increase Outcome: Progressing

## 2023-06-21 NOTE — Progress Notes (Signed)
 [facility] MD Progress Note  06/21/2023 1:39 PM [name] Colon Pitkin  MRN:  [id number] Principal Problem: MDD (major depressive disorder), recurrent episode, severe (HCC) Diagnosis: Principal Problem:   MDD (major depressive disorder), recurrent episode, severe (HCC) Active Problems:   PTSD (post-traumatic stress disorder)   Nightmares associated with chronic post-traumatic stress disorder   ID & Admission Information: [name] is an 63 y.o. 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  He presented on 06/18/2023  5:47 PM for MDD (major depressive disorder), recurrent episode, severe (HCC).  He presented with suicidal ideation and plan to hang himself.  Subjective:   Case was discussed in the multidisciplinary team. MAR was reviewed and patient was compliant with medications.   PRN's in last 24 hours: Tylenol  650 mg  Atarax  25 mg administered at 0825  [name] was seen in his room during rounds.  His complaints are largely unchanged since yesterday.  He continues to report significant middle insomnia.  He continues to endorse suicidal ideation.  He contracts for safety in the hospital, but states that he would hang himself if discharged today.  He denies any ill effects from increasing doxepin  or decreasing Lexapro  yesterday.  He continues to endorse poor appetite, low energy, anhedonia, hopelessness, helplessness, and worthlessness.  We discussed the plan of discontinuing Lexapro  and optimizing doxepin .  He had an EKG this morning with no significant changes in his QTc.  We discussed discontinuing metoprolol  which he was not taking  outside of the hospital as beta-blockers can contribute to depression.  He states that he has headaches almost every day, so we discussed adding Topamax  to his regimen.  Will start at 25 mg at bedtime.  Will increase doxepin  from 75 mg nightly to 100 mg nightly and continue to titrate to effect.  Will increase atorvastatin  from 20 to 40 mg due to ASCVD risk.  Will plan on continuing other medications as prescribed.  Will consider scheduling Atarax  if anxiety does not improve.   Past Psychiatric and Medical Medical History:  Past Medical History:  Diagnosis Date   Anxiety    Arthritis    knees and hands   Bipolar 1 disorder, depressed (HCC)    Bipolar disorder (HCC)    Depression    GERD (gastroesophageal reflux disease)    Hepatitis    HEP C   History of kidney stones    Hypertension    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 echo 5/17: EF 60-65%, no RWMA, LV dias fxn nl, LA mildly dilated, RV sys fxn nl, PASP nl, moderate sized circumferential pericardial effusion was identified, 2.12 cm around the LV free wall, <1 cm around the RV free wall. Features were not c/w tamponade physiology   PTSD (post-traumatic stress disorder)    Witnessed brother's suicide.   Restless leg syndrome    Seizures (HCC)    Syncope     Past Surgical History:  Procedure Laterality Date   APPENDECTOMY     CARDIOVERSION N/A 04/17/2023   Procedure: CARDIOVERSION;  Surgeon: Constancia Delton, MD;  Location: [facility] ORS;  Service: Cardiovascular;  Laterality: N/A;   CYSTOSCOPY WITH URETEROSCOPY AND STENT PLACEMENT  ESOPHAGOGASTRODUODENOSCOPY N/A 01/11/2016   Procedure: ESOPHAGOGASTRODUODENOSCOPY (EGD);  Surgeon: Baldo Bonds, MD;  Location[facility] ENDOSCOPY;  Service: Endoscopy;  Laterality: N/A;   ESOPHAGOGASTRODUODENOSCOPY N/A 04/09/2020   Procedure: ESOPHAGOGASTRODUODENOSCOPY (EGD);  Surgeon: Luke Salaam, MD;  Location: [facility] ENDOSCOPY;   Service: Gastroenterology;  Laterality: N/A;   INCISION AND DRAINAGE ABSCESS Left 01/02/2018   Procedure: INCISION AND DRAINAGE LEFT KNEE;  Surgeon: Marlynn Singer, MD;  Location: [facility] ORS;  Service: Orthopedics;  Laterality: Left;   JOINT REPLACEMENT Right    TKR   KNEE ARTHROSCOPY Right 06/25/2014   Procedure: ARTHROSCOPY KNEE;  Surgeon: Marlynn Singer, MD;  Location: [facility] ORS;  Service: Orthopedics;  Laterality: Right;  partial arthroscopic medial menisectomy   LAPAROSCOPIC APPENDECTOMY N/A 06/02/2021   Procedure: APPENDECTOMY LAPAROSCOPIC;  Surgeon: Flynn Hylan, MD;  Location: [facility] ORS;  Service: General;  Laterality: N/A;   TEE WITHOUT CARDIOVERSION N/A 04/17/2023   Procedure: ECHOCARDIOGRAM, TRANSESOPHAGEAL;  Surgeon: Constancia Delton, MD;  Location: [facility] ORS;  Service: Cardiovascular;  Laterality: N/A;   TOTAL KNEE ARTHROPLASTY Right 04/22/2015   Procedure: TOTAL KNEE ARTHROPLASTY;  Surgeon: Marlynn Singer, MD;  Location: [facility] ORS;  Service: Orthopedics;  Laterality: Right;   TOTAL KNEE ARTHROPLASTY Left 10/30/2017   Procedure: TOTAL KNEE ARTHROPLASTY;  Surgeon: Marlynn Singer, MD;  Location: [facility] ORS;  Service: Orthopedics;  Laterality: Left;   TOTAL KNEE REVISION Left 01/02/2018   Procedure: poly exchange of tibia and patella left knee;  Surgeon: Marlynn Singer, MD;  Location: [facility] ORS;  Service: Orthopedics;  Laterality: Left;   UMBILICAL HERNIA REPAIR  06/02/2021   Procedure: HERNIA REPAIR UMBILICAL ADULT;  Surgeon: Flynn Hylan, MD;  Location: [facility] ORS;  Service: General;;    Family History(Medical and Psychiatric):  Family History  Problem Relation Age of Onset   CVA Mother        deceased at age 70   Depression Brother        Died by suicide at age 30   Social History:  Social History   Substance and Sexual Activity  Alcohol Use Not Currently   Comment: rare     Social History   Substance and Sexual Activity  Drug Use Not Currently   Types: Marijuana,  Cocaine   Comment: last use January 2025, cocaine    Social History   Socioeconomic History   Marital status: Single    Spouse name: Not on file   Number of children: Not on file   Years of education: Not on file   Highest education level: Not on file  Occupational History   Not on file  Tobacco Use   Smoking status: Former    Current packs/day: 0.00    Average packs/day: 0.8 packs/day for 20.0 years (15.0 ttl pk-yrs)    Types: Cigarettes    Start date: 05/16/1964    Quit date: 05/16/1984    Years since quitting: 39.1   Smokeless tobacco: Never  Vaping Use   Vaping status: Never Used  Substance and Sexual Activity   Alcohol use: Not Currently    Comment: rare   Drug use: Not Currently    Types: Marijuana, Cocaine    Comment: last use January 2025, cocaine   Sexual activity: Not Currently  Other Topics Concern   Not on file  Social History Narrative   ** Merged History Encounter **       Social Drivers of Health   Financial Resource Strain: Not on file  Food Insecurity: Food Insecurity Present (06/18/2023)   Hunger Vital  Sign    Worried About Programme researcher, broadcasting/film/video in the Last Year: Often true    Ran Out of Food in the Last Year: Often true  Transportation Needs: No Transportation Needs (06/18/2023)   PRAPARE - Administrator, Civil Service (Medical): No    Lack of Transportation (Non-Medical): No  Physical Activity: Not on file  Stress: Not on file  Social Connections: Socially Isolated (02/15/2023)   Social Connection and Isolation Panel [NHANES]    Frequency of Communication with Friends and Family: Once a week    Frequency of Social Gatherings with Friends and Family: Once a week    Attends Religious Services: Never    Database administrator or Organizations: No    Attends Engineer, structural: Never    Marital Status: Never married        Current Medications: Current Facility-Administered Medications  Medication Dose Route Frequency Provider  Last Rate Last Admin   acetaminophen  (TYLENOL ) tablet 650 mg  650 mg Oral Q6H PRN Mills, Shnese E, NP   650 mg at 06/20/23 0858   alum & mag hydroxide-simeth (MAALOX/MYLANTA) 200-200-20 MG/5ML suspension 30 mL  30 mL Oral Q4H PRN Mills, Shnese E, NP       amiodarone  (PACERONE ) tablet 200 mg  200 mg Oral Daily Zouev, Dmitri, MD   200 mg at 06/21/23 0824   [START ON 06/22/2023] atorvastatin  (LIPITOR) tablet 40 mg  40 mg Oral Daily Timmothy Foots, MD       haloperidol  (HALDOL ) tablet 5 mg  5 mg Oral TID PRN Doneen Fuelling, NP       And   diphenhydrAMINE  (BENADRYL ) capsule 50 mg  50 mg Oral TID PRN Mills, Shnese E, NP       haloperidol  lactate (HALDOL ) injection 5 mg  5 mg Intramuscular TID PRN Doneen Fuelling, NP       And   diphenhydrAMINE  (BENADRYL ) injection 50 mg  50 mg Intramuscular TID PRN Doneen Fuelling, NP       And   LORazepam  (ATIVAN ) injection 2 mg  2 mg Intramuscular TID PRN Mills, Shnese E, NP       haloperidol  lactate (HALDOL ) injection 10 mg  10 mg Intramuscular TID PRN Doneen Fuelling, NP       And   diphenhydrAMINE  (BENADRYL ) injection 50 mg  50 mg Intramuscular TID PRN [name], NP       And   LORazepam  (ATIVAN ) injection 2 mg  2 mg Intramuscular TID PRN Mills, Shnese E, NP       divalproex  (DEPAKOTE ) DR tablet 750 mg  750 mg Oral BID Mills, Shnese E, NP   750 mg at 06/21/23 1610   doxepin  (SINEQUAN ) capsule 100 mg  100 mg Oral QHS Timmothy Foots, MD       hydrOXYzine  (ATARAX ) tablet 25 mg  25 mg Oral TID PRN Mills, Shnese E, NP   25 mg at 06/21/23 0825   lurasidone  (LATUDA ) tablet 20 mg  20 mg Oral Q supper Mills, Shnese E, NP   20 mg at 06/20/23 1700   magnesium  hydroxide (MILK OF MAGNESIA) suspension 30 mL  30 mL Oral Daily PRN Mills, Shnese E, NP       multivitamin with minerals tablet 1 tablet  1 tablet Oral Daily [name], NP   1 tablet at 06/21/23 9604   prazosin  (MINIPRESS ) capsule 2 mg  2 mg Oral QHS Mills, Shnese E,  NP   2 mg at 06/20/23 2114    tamsulosin  (FLOMAX ) capsule 0.4 mg  0.4 mg Oral QPC supper Mills, Shnese E, NP   0.4 mg at 06/20/23 1831   topiramate  (TOPAMAX ) tablet 25 mg  25 mg Oral QHS Timmothy Foots, MD        Lab Results:  Results for orders placed or performed during the hospital encounter of 06/18/23 (from the past 48 hours)  Folate     Status: None   Collection Time: 06/19/23  6:32 PM  Result Value Ref Range   Folate 15.9 >5.9 ng/mL    Comment: Performed at [facility], [address], [place], [place] [address]  Vitamin B12     Status: None   Collection Time: 06/19/23  6:32 PM  Result Value Ref Range   Vitamin B-12 430 180 - 914 pg/mL    Comment: (NOTE) This assay is not validated for testing neonatal or myeloproliferative syndrome specimens for Vitamin B12 levels. Performed at [facility], [address], [place], [place] [address]   VITAMIN D 25 Hydroxy (Vit-D Deficiency, Fractures)     Status: None   Collection Time: 06/19/23  6:32 PM  Result Value Ref Range   Vit D, 25-Hydroxy 31.64 30 - 100 ng/mL    Comment: (NOTE) Vitamin D deficiency has been defined by the Institute of Medicine  and an Endocrine Society practice guideline as a level of serum 25-OH  vitamin D less than 20 ng/mL (1,2). The Endocrine Society went on to  further define vitamin D insufficiency as a level between 21 and 29  ng/mL (2).  1. IOM (Institute of Medicine). 2010. Dietary reference intakes for  calcium  and D. [place]: [employer]. 2. Holick MF, Binkley Ashmore, Bischoff-Ferrari HA, et al. Evaluation,  treatment, and prevention of vitamin D deficiency: an Endocrine  Society clinical practice guideline, JCEM. 2011 Jul; 96(7): 1911-30.  Performed at [facility], [address], [place], [place] [address]   RPR     Status: None   Collection Time: 06/19/23  6:32 PM  Result Value Ref Range   RPR Ser Ql NON REACTIVE NON REACTIVE    Comment: Performed at  [facility], [address] [place], [place] [address]  HIV Antibody (routine testing w rflx)     Status: None   Collection Time: 06/19/23  6:32 PM  Result Value Ref Range   HIV Screen 4th Generation wRfx Non Reactive Non Reactive    Comment: Performed at [facility], [address] [place], [place] [address]    Blood Alcohol level:  Lab Results  Component Value Date   [facility] <15 05/29/2023   ETH <10 04/12/2023    Metabolic Disorder Labs: Lab Results  Component Value Date   HGBA1C 4.9 06/01/2023   MPG 93.93 06/01/2023   MPG 111.15 12/29/2022   No results found for: PROLACTIN Lab Results  Component Value Date   CHOL 246 (H) 06/01/2023   TRIG 129 06/01/2023   HDL 37 (L) 06/01/2023   CHOLHDL 6.6 06/01/2023   VLDL 26 06/01/2023   LDLCALC 183 (H) 06/01/2023   LDLCALC 146 (H) 12/29/2022    Physical Findings: AIMS:  , ,  ,  ,    CIWA:    COWS:     Psychiatric Specialty Exam:  Presentation  General Appearance: Disheveled  Eye Contact: Fair  Speech: Clear and Coherent  Speech Volume: Normal  Handedness: Right   Mood and Affect  Mood: Dysphoric; Anxious  Affect: Restricted; Congruent   Thought Process  Thought Processes: Linear  Descriptions of Associations: Intact  Orientation: Partial  Thought Content: Logical  History of Schizophrenia/Schizoaffective disorder: No  Duration of Psychotic Symptoms: NA Hallucinations: Hallucinations: None  Ideas of Reference: None  Suicidal Thoughts: Suicidal Thoughts: Yes, Active SI Active Intent and/or Plan: With Plan  Homicidal Thoughts: Homicidal Thoughts: No   Sensorium  Memory: Immediate Good; Recent Good  Judgment: Poor  Insight: Fair   Art therapist  Concentration: Good  Attention Span: Good  Recall: Good  Fund of Knowledge: Good  Language: Good   Psychomotor Activity  Psychomotor Activity: Psychomotor Activity: Psychomotor Retardation   Assets  Assets:  Communication Skills; Resilience   Sleep  Sleep: Sleep: Poor   Musculoskeletal: Strength & Muscle Tone: within normal limits Gait & Station: normal Patient leans: N/A   Physical Exam: General: Sitting comfortably. NAD. HEENT: Normocephalic, atraumatic, MMM, EMOI Lungs: no increased work of breathing noted Heart: no cyanosis Abdomen: Non distended Musculoskeletal: FROM. No obvious deformities Skin: Warm, dry, intact. No rashes noted Neuro: No obvious focal deficits.  Gait and station are normal  Review of Systems  Constitutional: Negative.   HENT: Negative.    Eyes: Negative.   Respiratory: Negative.    Cardiovascular: Negative.   Gastrointestinal: Negative.   Genitourinary: Negative.   Skin: Negative.   Neurological: Negative.   Psychiatric/Behavioral:  Positive for depression, anxiety, insomnia.     Blood pressure 130/81, pulse 71, temperature 97.9 F (36.6 C), temperature source Oral, resp. rate 17, height 5' 9 (1.753 m), weight 82.6 kg, SpO2 98%. Body mass index is 26.88 kg/m.  ASSESSMENT: [name] is an 63 y.o. 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  He presented on 06/18/2023  5:47 PM for MDD (major depressive disorder), recurrent episode, severe (HCC).  He presented with suicidal ideation and plan to hang himself.  He wants treatment for cocaine abuse.  Diagnoses / Active Problems: Patient Active Problem List   Diagnosis Date Noted   Nightmares associated with chronic post-traumatic stress disorder 06/19/2023   MDD (major depressive disorder), recurrent episode, severe (HCC) 06/18/2023    Severe recurrent major depression with psychotic features (HCC) 06/16/2023   Bipolar 1 disorder, depressed (HCC) 05/30/2023   Atrial fibrillation with rapid ventricular response (HCC) 04/15/2023   Primary hypertension 04/15/2023   Syncope 04/12/2023   Seizure (HCC) 04/12/2023   Persistent atrial fibrillation (HCC) 04/11/2023   Seizures (HCC) 02/13/2023   Transaminitis 02/13/2023   Polysubstance abuse (HCC) 02/13/2023   Sinus tachycardia 02/13/2023   Rhabdomyolysis 02/12/2023   Mild tetrahydrocannabinol (THC) abuse 12/24/2022   Bipolar disorder, current episode depressed, severe, with psychotic features (HCC) 12/24/2022   Atrial flutter (HCC) 12/21/2022   SVT (supraventricular tachycardia) (HCC) 12/20/2022   PTSD (post-traumatic stress disorder) 09/16/2022   Suicidal ideation 09/15/2022   Malingering 06/28/2021   Cocaine use    Moderate episode of recurrent major depressive disorder (HCC) 05/15/2021   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[facility]) 05/03/2021   Essential hypertension 01/18/2021   Alcohol abuse 01/18/2021   Coronary artery disease of native artery of native heart with stable angina pectoris (HCC)  PLAN: Safety and Monitoring:  -- Voluntary admission to inpatient psychiatric unit for safety, stabilization and treatment  -- Daily contact with patient to assess and evaluate symptoms and progress in treatment  -- Patient's case to be discussed in multi-disciplinary team meeting  -- Observation Level : q15 minute checks  -- Vital signs:  q12 hours  -- Precautions: suicide, elopement, and assault  2. Psychiatric Diagnoses and Treatment:  Patient Active Problem List   Diagnosis Date Noted   Nightmares associated with chronic post-traumatic stress disorder 06/19/2023   MDD (major depressive disorder), recurrent episode, severe (HCC) 06/18/2023   Severe recurrent major depression with psychotic features (HCC) 06/16/2023   Bipolar 1 disorder, depressed (HCC)  05/30/2023   Atrial fibrillation with rapid ventricular response (HCC) 04/15/2023   Primary hypertension 04/15/2023   Syncope 04/12/2023   Seizure (HCC) 04/12/2023   Persistent atrial fibrillation (HCC) 04/11/2023   Seizures (HCC) 02/13/2023   Transaminitis 02/13/2023   Polysubstance abuse (HCC) 02/13/2023   Sinus tachycardia 02/13/2023   Rhabdomyolysis 02/12/2023   Mild tetrahydrocannabinol (THC) abuse 12/24/2022   Bipolar disorder, current episode depressed, severe, with psychotic features (HCC) 12/24/2022   Atrial flutter (HCC) 12/21/2022   SVT (supraventricular tachycardia) (HCC) 12/20/2022   PTSD (post-traumatic stress disorder) 09/16/2022   Suicidal ideation 09/15/2022   Malingering 06/28/2021   Cocaine use    Moderate episode of recurrent major depressive disorder (HCC) 05/15/2021   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[facility]) 05/03/2021   Essential hypertension 01/18/2021   Alcohol abuse 01/18/2021   Coronary artery disease of native artery of native heart with stable angina pectoris (HCC)      Scheduled Medications:  amiodarone   200 mg Oral Daily   [START ON 06/22/2023] atorvastatin   40 mg Oral Daily   divalproex   750 mg Oral BID   doxepin   100 mg Oral QHS   lurasidone   20 mg Oral Q supper   multivitamin with minerals  1 tablet Oral Daily   prazosin   2 mg Oral QHS   tamsulosin   0.4 mg Oral QPC supper   topiramate   25 mg Oral QHS    As Needed Medications: acetaminophen , alum & mag hydroxide-simeth, haloperidol  **AND** diphenhydrAMINE , haloperidol  lactate **AND** diphenhydrAMINE  **AND** LORazepam , haloperidol  lactate **AND** diphenhydrAMINE  **AND** LORazepam , hydrOXYzine , magnesium  hydroxide    3. Medical Issues Being Addressed:   -- As above  Labs reviewed, unremarkable with the exception of: Hypocalcemia 8.8, elevated CK4 25, hypercholesterolemia to 46 with LDL of 183, anemia 12.5  4. Discharge Planning:   -- Social work and case management to assist with  discharge planning and identification of hospital follow-up needs prior to discharge  -- Estimated LOS: 7 days  -- Discharge Concerns: Need to establish a safety plan; Medication compliance and effectiveness  -- Discharge Goals: Return home with outpatient referrals for mental health follow-up including medication management/psychotherapy  5. Short Term Goals:  Improve ability to identify changes in lifestyle to reduce recurrence of condition, verbalize feelings, disclose and discuss suicidal ideas, demonstrate self-control, identify and develop effective coping behaviors, compliance with prescribed medications, identify triggers associated with substance abuse/mental health issues, participate in unit milieu and in scheduled group therapies   6. Long Term Goals: Improvement in symptoms so the patient is ready for discharge   --The risks/benefits/side-effects/alternatives to the medications above were discussed in detail with the patient and time was given for questions. The patient provided informed consent.   -- Metabolic profile and EKG monitoring obtained while on an atypical antipsychotic  and listed in the EHR    Total Time Spent in Direct Patient Care:  I personally spent 35 minutes on the unit in direct patient care. The direct patient care time included face-to-face time with the patient, reviewing the patient's chart, communicating with other professionals, and coordinating care. Greater than 50% of this time was spent in counseling or coordinating care with the patient regarding goals of hospitalization, psycho-education, and discharge planning needs.      Clair Crews, MD Psychiatrist  06/21/2023, 1:39 PM   I certify that inpatient services furnished can reasonably be expected to improve the patient's condition.    Portions of this note were created using voice recognition software. Minor syntax errors, grammatical content, spelling, or punctuation errors may have occurred  unintentionally. Please notify the [facility] if the meaning of any statement is unclear.

## 2023-06-21 NOTE — [facility] Group Notes (Signed)

## 2023-06-22 DIAGNOSIS — F332 Major depressive disorder, recurrent severe without psychotic features: Secondary | ICD-10-CM | POA: Diagnosis not present

## 2023-06-22 MED ORDER — DOXEPIN HCL 25 MG PO CAPS
125.0000 mg | ORAL_CAPSULE | Freq: Every day | ORAL | Status: DC
  Administered 2023-06-22: 125 mg via ORAL
  Filled 2023-06-22: qty 5

## 2023-06-22 MED ORDER — TOPIRAMATE 25 MG PO TABS
50.0000 mg | ORAL_TABLET | Freq: Every day | ORAL | Status: DC
  Administered 2023-06-22 – 2023-07-03 (×12): 50 mg via ORAL
  Filled 2023-06-22 (×12): qty 2
  Filled 2023-06-22: qty 28

## 2023-06-22 MED ORDER — AMLODIPINE BESYLATE 5 MG PO TABS
5.0000 mg | ORAL_TABLET | Freq: Every day | ORAL | Status: DC
  Administered 2023-06-22 – 2023-07-02 (×8): 5 mg via ORAL
  Filled 2023-06-22 (×11): qty 1

## 2023-06-22 MED ORDER — LOPERAMIDE HCL 2 MG PO CAPS
2.0000 mg | ORAL_CAPSULE | ORAL | Status: DC | PRN
  Administered 2023-06-22 (×2): 2 mg via ORAL
  Filled 2023-06-22 (×2): qty 1

## 2023-06-22 NOTE — Plan of Care (Signed)

## 2023-06-22 NOTE — Progress Notes (Addendum)
  [name] Colon [place] with [place] (CST team lead) who confirmed that patient does not have a legal guardian. Pt does have a Special educational needs teacher through [employer] probation (office [phone] ext. [phone] and cell #[phone]). Attempted to reach officer Ethyl Hering in regards to getting permission for patient to attend inpatient substance use treatment, no response. Left HIPAA compliant voicemail.  [place] would like pt to be referred to [facility] or [facility] for treatment, however, letter from probation officer is required for both places.   Will continue to assist.  245PM: Online referral submitted for [facility]. Clinicals faxed to [facility]. Pt aware and agreeable. Pt also given PO # and was encouraged to call to get letter for treatment.   Signed:  Dorian Renfro, LCSW-A 06/22/2023  1:38 PM

## 2023-06-22 NOTE — Progress Notes (Signed)
 D: Patient is alert, oriented, and cooperative. Endorses SI with plan to hang self. No plan in the hospital and verbally contracts for safety. Denies HI and AVH. Patient reports diarrhea.    A: Scheduled medications administered per MD order. PRN imodium  administered. Support provided. Patient educated on safety on the unit and medications. Routine safety checks every 15 minutes. Patient stated understanding to tell nurse about any new physical symptoms. Patient understands to tell staff of any needs.     R: No adverse drug reactions noted. Patient remains safe at this time and will continue to monitor.    06/22/23 0900  Psych Admission Type (Psych Patients Only)  Admission Status Voluntary  Psychosocial Assessment  Patient Complaints Anxiety;Depression  Eye Contact Fair  Facial Expression Flat;Worried  Affect Appropriate to circumstance  Speech Logical/coherent  Interaction Assertive  Motor Activity Slow  Appearance/Hygiene Unremarkable  Behavior Characteristics Cooperative  Mood Depressed  Thought Process  Coherency WDL  Content WDL  Delusions None reported or observed  Perception WDL  Hallucination None reported or observed  Judgment Poor  Confusion None  Danger to Self  Current suicidal ideation? Passive  Description of Suicide Plan no plan here, plan to hang self  Self-Injurious Behavior Some self-injurious ideation observed or expressed.  No lethal plan expressed   Agreement Not to Harm Self Yes  Description of Agreement verbal  Danger to Others  Danger to Others None reported or observed

## 2023-06-22 NOTE — [facility] Group Notes (Signed)
 [facility] Group Notes:  (Nursing/MHT/Case Management/Adjunct)  Date:  06/22/2023  Time:  9:30 PM  Type of Therapy:  Wrap-up group  Participation Level:  Active  Participation Quality:  Appropriate  Affect:  Appropriate  Cognitive:  Appropriate  Insight:  Appropriate  Engagement in Group:  Engaged  Modes of Intervention:  Education  Summary of Progress/Problems: Goal to have a better day. Met. Rated day 5/10.  [name] 06/22/2023, 9:30 PM

## 2023-06-22 NOTE — Group Note (Signed)
 LCSW Group Therapy Note   Group Date: 06/22/2023 Start Time: 1100 End Time: 1200   Participation:  did not attend  Type of Therapy:  Group Therapy   Topic:  Stronger Together:  Building Healthy Relationships  Objective:  To explore loneliness, boundaries, and safe ways to build relationships.  Goals: Recognize healthy vs. unhealthy relationships. Learn safe ways to connect with others. Strengthen communication and [employer].  Summary:  Participants discussed loneliness, healthy connections, and setting boundaries. They explored safe ways to meet people and shared personal experiences. Key insights were reinforced through discussion and quotes.  Therapeutic Modalities Used: Cognitive Behavioral Therapy (CBT) Elements - Identifying unhealthy relationship patterns, challenging negative thoughts about connection. Dialectical Behavior Therapy (DBT) Elements - Interpersonal effectiveness, setting and maintaining boundaries. Supportive Group Therapy - Peer discussion, shared experiences, and emotional validation.   Julyanna Scholle O January Bergthold, LCSWA 06/22/2023  4:58 PM

## 2023-06-22 NOTE — Progress Notes (Signed)
 [facility] MD Progress Note  06/22/2023 1:04 PM [name]  MRN:  [id number] Principal Problem: MDD (major depressive disorder), recurrent episode, severe (HCC) Diagnosis: Principal Problem:   MDD (major depressive disorder), recurrent episode, severe (HCC) Active Problems:   PTSD (post-traumatic stress disorder)   Nightmares associated with chronic post-traumatic stress disorder   ID & Admission Information: [name] is an 63 y.o. 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  He presented on 06/18/2023  5:47 PM for MDD (major depressive disorder), recurrent episode, severe (HCC).  He presented with suicidal ideation and plan to hang himself.  Subjective:   Case was discussed in the multidisciplinary team. MAR was reviewed and patient was compliant with medications.   PRN's in last 24 hours: Imodium  administered at 0906 Tylenol  650 mg administered at 1721 Atarax  25 mg administered at 2116  Hyde was seen in his room during rounds.  He continues to complain of severe depression and anxiety.  He continues to report middle insomnia with no improvement.  That said, he is noted to be lying in bed all day which is contributing to lack of sleep at night.  He has a lockout order, but it is difficult to enforce because he has a roommate.  He continues to state that he thinks about suicide all the time.  He states that he left the hospital day he would hang himself.  We discussed continuing to increase doxepin  for depression.  He tells me that he was doing okay without Flomax  outside of the hospital so we will plan on discontinuing  that today.  If he develops problems with urination we will add it back on.  He continues to complain of headache with low-dose Topamax  so we will increase that to 50 mg.  Will start Norvasc  5 mg for hypertension.     Past Psychiatric and Medical Medical History:  Past Medical History:  Diagnosis Date   Anxiety    Arthritis    knees and hands   Bipolar 1 disorder, depressed (HCC)    Bipolar disorder (HCC)    Depression    GERD (gastroesophageal reflux disease)    Hepatitis    HEP C   History of kidney stones    Hypertension    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 echo 5/17: EF 60-65%, no RWMA, LV dias fxn nl, LA mildly dilated, RV sys fxn nl, PASP nl, moderate sized circumferential pericardial effusion was identified, 2.12 cm around the LV free wall, <1 cm around the RV free wall. Features were not c/w tamponade physiology   PTSD (post-traumatic stress disorder)    Witnessed brother's suicide.   Restless leg syndrome    Seizures (HCC)    Syncope     Past Surgical History:  Procedure Laterality Date   APPENDECTOMY     CARDIOVERSION N/A 04/17/2023   Procedure: CARDIOVERSION;  Surgeon: Constancia Delton, MD;  Location: [facility] ORS;  Service: Cardiovascular;  Laterality: N/A;   CYSTOSCOPY WITH URETEROSCOPY AND STENT PLACEMENT     ESOPHAGOGASTRODUODENOSCOPY N/A 01/11/2016   Procedure: ESOPHAGOGASTRODUODENOSCOPY (EGD);  Surgeon: Baldo Bonds, MD;  Location: [facility] ENDOSCOPY;  Service: Endoscopy;  Laterality:  N/A;   ESOPHAGOGASTRODUODENOSCOPY N/A 04/09/2020   Procedure: ESOPHAGOGASTRODUODENOSCOPY (EGD);  Surgeon: Luke Salaam, MD;  Location: [facility] ENDOSCOPY;  Service: Gastroenterology;  Laterality: N/A;   INCISION AND DRAINAGE ABSCESS Left 01/02/2018   Procedure: INCISION AND DRAINAGE LEFT KNEE;  Surgeon: Marlynn Singer, MD;  Location: [facility] ORS;  Service: Orthopedics;  Laterality: Left;   JOINT REPLACEMENT Right    TKR    KNEE ARTHROSCOPY Right 06/25/2014   Procedure: ARTHROSCOPY KNEE;  Surgeon: Marlynn Singer, MD;  Location: [facility] ORS;  Service: Orthopedics;  Laterality: Right;  partial arthroscopic medial menisectomy   LAPAROSCOPIC APPENDECTOMY N/A 06/02/2021   Procedure: APPENDECTOMY LAPAROSCOPIC;  Surgeon: Flynn Hylan, MD;  Location: [facility] ORS;  Service: General;  Laterality: N/A;   TEE WITHOUT CARDIOVERSION N/A 04/17/2023   Procedure: ECHOCARDIOGRAM, TRANSESOPHAGEAL;  Surgeon: Constancia Delton, MD;  Location: [facility] ORS;  Service: Cardiovascular;  Laterality: N/A;   TOTAL KNEE ARTHROPLASTY Right 04/22/2015   Procedure: TOTAL KNEE ARTHROPLASTY;  Surgeon: Marlynn Singer, MD;  Location: [facility] ORS;  Service: Orthopedics;  Laterality: Right;   TOTAL KNEE ARTHROPLASTY Left 10/30/2017   Procedure: TOTAL KNEE ARTHROPLASTY;  Surgeon: Marlynn Singer, MD;  Location: [facility] ORS;  Service: Orthopedics;  Laterality: Left;   TOTAL KNEE REVISION Left 01/02/2018   Procedure: poly exchange of tibia and patella left knee;  Surgeon: Marlynn Singer, MD;  Location: [facility] ORS;  Service: Orthopedics;  Laterality: Left;   UMBILICAL HERNIA REPAIR  06/02/2021   Procedure: HERNIA REPAIR UMBILICAL ADULT;  Surgeon: Flynn Hylan, MD;  Location: [facility] ORS;  Service: General;;    Family History(Medical and Psychiatric):  Family History  Problem Relation Age of Onset   CVA Mother        deceased at age 74   Depression Brother        Died by suicide at age 33   Social History:  Social History   Substance and Sexual Activity  Alcohol Use Not Currently   Comment: rare     Social History   Substance and Sexual Activity  Drug Use Not Currently   Types: Marijuana, Cocaine   Comment: last use January 2025, cocaine    Social History   Socioeconomic History   Marital status: Single    Spouse name: Not on file   Number of children: Not on file   Years of education: Not on file   Highest education level: Not on file   Occupational History   Not on file  Tobacco Use   Smoking status: Former    Current packs/day: 0.00    Average packs/day: 0.8 packs/day for 20.0 years (15.0 ttl pk-yrs)    Types: Cigarettes    Start date: 05/16/1964    Quit date: 05/16/1984    Years since quitting: 39.1   Smokeless tobacco: Never  Vaping Use   Vaping status: Never Used  Substance and Sexual Activity   Alcohol use: Not Currently    Comment: rare   Drug use: Not Currently    Types: Marijuana, Cocaine    Comment: last use January 2025, cocaine   Sexual activity: Not Currently  Other Topics Concern   Not on file  Social History Narrative   ** Merged History Encounter **       Social Drivers of Health   Financial Resource Strain: Not on file  Food Insecurity: Food Insecurity Present (06/18/2023)   Hunger Vital Sign    Worried About Running Out of Food in the Last Year: Often true    Ran Out of  Food in the Last Year: Often true  Transportation Needs: No Transportation Needs (06/18/2023)   PRAPARE - Administrator, Civil Service (Medical): No    Lack of Transportation (Non-Medical): No  Physical Activity: Not on file  Stress: Not on file  Social Connections: Socially Isolated (02/15/2023)   Social Connection and Isolation Panel    Frequency of Communication with Friends and Family: Once a week    Frequency of Social Gatherings with Friends and Family: Once a week    Attends Religious Services: Never    Database administrator or Organizations: No    Attends Engineer, structural: Never    Marital Status: Never married        Current Medications: Current Facility-Administered Medications  Medication Dose Route Frequency Provider Last Rate Last Admin   acetaminophen  (TYLENOL ) tablet 650 mg  650 mg Oral Q6H PRN Mills, Shnese E, NP   650 mg at 06/21/23 1721   alum & mag hydroxide-simeth (MAALOX/MYLANTA) 200-200-20 MG/5ML suspension 30 mL  30 mL Oral Q4H PRN Mills, Shnese E, NP       amiodarone   (PACERONE ) tablet 200 mg  200 mg Oral Daily Zouev, Dmitri, MD   200 mg at 06/22/23 0838   atorvastatin  (LIPITOR) tablet 40 mg  40 mg Oral Daily Madylyn Insco S, MD   40 mg at 06/22/23 6433   haloperidol  (HALDOL ) tablet 5 mg  5 mg Oral TID PRN Doneen Fuelling, NP       And   diphenhydrAMINE  (BENADRYL ) capsule 50 mg  50 mg Oral TID PRN Mills, Shnese E, NP       haloperidol  lactate (HALDOL ) injection 5 mg  5 mg Intramuscular TID PRN Doneen Fuelling, NP       And   diphenhydrAMINE  (BENADRYL ) injection 50 mg  50 mg Intramuscular TID PRN Doneen Fuelling, NP       And   LORazepam  (ATIVAN ) injection 2 mg  2 mg Intramuscular TID PRN Mills, Shnese E, NP       haloperidol  lactate (HALDOL ) injection 10 mg  10 mg Intramuscular TID PRN Doneen Fuelling, NP       And   diphenhydrAMINE  (BENADRYL ) injection 50 mg  50 mg Intramuscular TID PRN [name], NP       And   LORazepam  (ATIVAN ) injection 2 mg  2 mg Intramuscular TID PRN Mills, Shnese E, NP       divalproex  (DEPAKOTE ) DR tablet 750 mg  750 mg Oral BID Mills, Shnese E, NP   750 mg at 06/22/23 2951   doxepin  (SINEQUAN ) capsule 125 mg  125 mg Oral QHS Timmothy Foots, MD       hydrOXYzine  (ATARAX ) tablet 25 mg  25 mg Oral TID PRN Mills, Shnese E, NP   25 mg at 06/21/23 2116   loperamide  (IMODIUM ) capsule 2 mg  2 mg Oral PRN Grover Woodfield S, MD   2 mg at 06/22/23 8841   lurasidone  (LATUDA ) tablet 20 mg  20 mg Oral Q supper Mills, Shnese E, NP   20 mg at 06/21/23 1722   magnesium  hydroxide (MILK OF MAGNESIA) suspension 30 mL  30 mL Oral Daily PRN Mills, Shnese E, NP       multivitamin with minerals tablet 1 tablet  1 tablet Oral Daily [name], NP   1 tablet at 06/22/23 6606   prazosin  (MINIPRESS ) capsule 2 mg  2 mg Oral QHS Mills, Shnese E,  NP   2 mg at 06/21/23 2116   tamsulosin  (FLOMAX ) capsule 0.4 mg  0.4 mg Oral QPC supper Mills, Shnese E, NP   0.4 mg at 06/21/23 1722   topiramate  (TOPAMAX ) tablet 50 mg  50 mg Oral QHS Timmothy Foots, MD         Lab Results:  No results found for this or any previous visit (from the past 48 hours).   Blood Alcohol level:  Lab Results  Component Value Date   [facility] <15 05/29/2023   ETH <10 04/12/2023    Metabolic Disorder Labs: Lab Results  Component Value Date   HGBA1C 4.9 06/01/2023   MPG 93.93 06/01/2023   MPG 111.15 12/29/2022   No results found for: PROLACTIN Lab Results  Component Value Date   CHOL 246 (H) 06/01/2023   TRIG 129 06/01/2023   HDL 37 (L) 06/01/2023   CHOLHDL 6.6 06/01/2023   VLDL 26 06/01/2023   LDLCALC 183 (H) 06/01/2023   LDLCALC 146 (H) 12/29/2022    Physical Findings: AIMS:  , ,  ,  ,    CIWA:    COWS:     Psychiatric Specialty Exam:  Presentation  General Appearance: Disheveled  Eye Contact: Fair  Speech: Clear and Coherent  Speech Volume: Normal  Handedness: Right   Mood and Affect  Mood: Dysphoric; Anxious  Affect: Restricted; Congruent   Thought Process  Thought Processes: Linear  Descriptions of Associations: Intact  Orientation: Partial  Thought Content: Logical  History of Schizophrenia/Schizoaffective disorder: No  Duration of Psychotic Symptoms: NA Hallucinations: No data recorded  Ideas of Reference: None  Suicidal Thoughts: No data recorded  Homicidal Thoughts: No data recorded   Sensorium  Memory: Immediate Good; Recent Good  Judgment: Poor  Insight: Fair   Art therapist  Concentration: Good  Attention Span: Good  Recall: Good  Fund of Knowledge: Good  Language: Good   Psychomotor Activity  Psychomotor Activity: No data recorded   Assets  Assets: Communication Skills; Resilience   Sleep  Sleep: No data recorded   Musculoskeletal: Strength & Muscle Tone: within normal limits Gait & Station: normal Patient leans: N/A   Physical Exam: General: Sitting comfortably. NAD. HEENT: Normocephalic, atraumatic, MMM, EMOI Lungs: no increased work of breathing noted Heart:  no cyanosis Abdomen: Non distended Musculoskeletal: FROM. No obvious deformities Skin: Warm, dry, intact. No rashes noted Neuro: No obvious focal deficits.  Gait and station are normal  Review of Systems  Constitutional: Negative.   HENT: Negative.    Eyes: Negative.   Respiratory: Negative.    Cardiovascular: Negative.   Gastrointestinal: Negative.   Genitourinary: Negative.   Skin: Negative.   Neurological: Negative.   Psychiatric/Behavioral:  Positive for depression, anxiety, insomnia.     Blood pressure (!) 129/94, pulse 69, temperature 97.9 F (36.6 C), temperature source Oral, resp. rate 17, height 5' 9 (1.753 m), weight 82.6 kg, SpO2 96%. Body mass index is 26.88 kg/m.  ASSESSMENT: [name] is an 63 y.o. 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  He presented on 06/18/2023  5:47 PM for MDD (major depressive disorder), recurrent episode, severe (HCC).  He presented with suicidal ideation and plan to hang himself.  He wants  treatment for cocaine abuse.  Diagnoses / Active Problems: Patient Active Problem List   Diagnosis Date Noted   Nightmares associated with chronic post-traumatic stress disorder 06/19/2023   MDD (major depressive disorder), recurrent episode, severe (HCC) 06/18/2023   Severe recurrent major depression with psychotic features (HCC) 06/16/2023   Bipolar 1 disorder, depressed (HCC) 05/30/2023   Atrial fibrillation with rapid ventricular response (HCC) 04/15/2023   Primary hypertension 04/15/2023   Syncope 04/12/2023   Seizure (HCC) 04/12/2023   Persistent atrial fibrillation (HCC)  04/11/2023   Seizures (HCC) 02/13/2023   Transaminitis 02/13/2023   Polysubstance abuse (HCC) 02/13/2023   Sinus tachycardia 02/13/2023   Rhabdomyolysis 02/12/2023   Mild tetrahydrocannabinol (THC) abuse 12/24/2022   Bipolar disorder, current episode depressed, severe, with psychotic features (HCC) 12/24/2022   Atrial flutter (HCC) 12/21/2022   SVT (supraventricular tachycardia) (HCC) 12/20/2022   PTSD (post-traumatic stress disorder) 09/16/2022   Suicidal ideation 09/15/2022   Malingering 06/28/2021   Cocaine use    Moderate episode of recurrent major depressive disorder (HCC) 05/15/2021   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[facility]) 05/03/2021   Essential hypertension 01/18/2021   Alcohol abuse 01/18/2021   Coronary artery disease of native artery of native heart with stable angina pectoris (HCC)       PLAN: Safety and Monitoring:  -- Voluntary admission to inpatient psychiatric unit for safety, stabilization and treatment  -- Daily contact with patient to assess and evaluate symptoms and progress in treatment  -- Patient's case to be discussed in multi-disciplinary team meeting  -- Observation Level : q15 minute checks  -- Vital signs:  q12 hours  -- Precautions: suicide, elopement, and assault  2. Psychiatric Diagnoses and Treatment:  Patient Active Problem List   Diagnosis Date Noted   Nightmares associated with chronic post-traumatic stress disorder 06/19/2023   MDD (major depressive disorder), recurrent episode, severe (HCC) 06/18/2023   Severe recurrent major depression with psychotic features (HCC) 06/16/2023   Bipolar 1 disorder, depressed (HCC) 05/30/2023   Atrial fibrillation with rapid ventricular response (HCC) 04/15/2023   Primary hypertension 04/15/2023   Syncope 04/12/2023   Seizure (HCC) 04/12/2023   Persistent atrial fibrillation (HCC) 04/11/2023   Seizures (HCC) 02/13/2023   Transaminitis 02/13/2023   Polysubstance abuse (HCC) 02/13/2023   Sinus  tachycardia 02/13/2023   Rhabdomyolysis 02/12/2023   Mild tetrahydrocannabinol (THC) abuse 12/24/2022   Bipolar disorder, current episode depressed, severe, with psychotic features (HCC) 12/24/2022   Atrial flutter (HCC) 12/21/2022   SVT (supraventricular tachycardia) (HCC) 12/20/2022   PTSD (post-traumatic stress disorder) 09/16/2022   Suicidal ideation 09/15/2022   Malingering 06/28/2021   Cocaine use    Moderate episode of recurrent major depressive disorder (HCC) 05/15/2021   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[facility]) 05/03/2021   Essential hypertension 01/18/2021   Alcohol abuse 01/18/2021   Coronary artery disease of native artery of native heart with stable angina pectoris (HCC)      Scheduled Medications:  amiodarone   200 mg Oral Daily   atorvastatin   40 mg Oral Daily   divalproex   750 mg Oral BID   doxepin   125 mg Oral QHS   lurasidone   20 mg Oral Q supper   multivitamin with minerals  1 tablet Oral Daily   prazosin   2 mg Oral QHS   tamsulosin   0.4 mg Oral QPC supper   topiramate   50 mg Oral QHS    As Needed Medications: acetaminophen , alum & mag hydroxide-simeth, haloperidol  **AND** diphenhydrAMINE , haloperidol  lactate **AND** diphenhydrAMINE  **AND**  LORazepam , haloperidol  lactate **AND** diphenhydrAMINE  **AND** LORazepam , hydrOXYzine , loperamide , magnesium  hydroxide    3. Medical Issues Being Addressed:   -- As above  Labs reviewed, unremarkable with the exception of: Hypocalcemia 8.8, elevated CK4 25, hypercholesterolemia to 46 with LDL of 183, anemia 12.5  4. Discharge Planning:   -- Social work and case management to assist with discharge planning and identification of hospital follow-up needs prior to discharge  -- Estimated LOS: 7 days  -- Discharge Concerns: Need to establish a safety plan; Medication compliance and effectiveness  -- Discharge Goals: Return home with outpatient referrals for mental health follow-up including medication  management/psychotherapy  5. Short Term Goals:  Improve ability to identify changes in lifestyle to reduce recurrence of condition, verbalize feelings, disclose and discuss suicidal ideas, demonstrate self-control, identify and develop effective coping behaviors, compliance with prescribed medications, identify triggers associated with substance abuse/mental health issues, participate in unit milieu and in scheduled group therapies   6. Long Term Goals: Improvement in symptoms so the patient is ready for discharge   --The risks/benefits/side-effects/alternatives to the medications above were discussed in detail with the patient and time was given for questions. The patient provided informed consent.   -- Metabolic profile and EKG monitoring obtained while on an atypical antipsychotic and listed in the EHR    Total Time Spent in Direct Patient Care:  I personally spent 35 minutes on the unit in direct patient care. The direct patient care time included face-to-face time with the patient, reviewing the patient's chart, communicating with other professionals, and coordinating care. Greater than 50% of this time was spent in counseling or coordinating care with the patient regarding goals of hospitalization, psycho-education, and discharge planning needs.      Clair Crews, MD Psychiatrist  06/22/2023, 1:04 PM   I certify that inpatient services furnished can reasonably be expected to improve the patient's condition.    Portions of this note were created using voice recognition software. Minor syntax errors, grammatical content, spelling, or punctuation errors may have occurred unintentionally. Please notify the [facility] if the meaning of any statement is unclear.

## 2023-06-22 NOTE — Group Note (Signed)
 Therapy Group Note  Group Topic:Other  Group Date: 06/22/2023 Start Time: 1400 End Time: 1430 Facilitators: Takila Kronberg G, OT    The primary objective of this topic is to explore and understand the concept of occupational balance in the context of daily living. The term occupational balance is defined broadly, encompassing all activities that occupy an individual's time and energy, including self-care, leisure, and work-related tasks. The goal is to guide participants towards achieving a harmonious blend of these activities, tailored to their personal values and life circumstances. This balance is aimed at enhancing overall well-being, not by equally distributing time across activities, but by ensuring that daily engagements are fulfilling and not draining. The content delves into identifying various barriers that individuals face in achieving occupational balance, such as overcommitment, misaligned priorities, external pressures, and lack of effective time management. The impact of these barriers on occupational performance, roles, and lifestyles is examined, highlighting issues like reduced efficiency, strained relationships, and potential health problems. Strategies for cultivating occupational balance are a key focus. These strategies include practical methods like time blocking, prioritizing tasks, establishing self-care rituals, decluttering, connecting with nature, and engaging in reflective practices. These approaches are designed to be adaptable and applicable to a wide range of life scenarios, promoting a proactive and mindful approach to daily living. The overall aim is to equip participants with the knowledge and tools to create a balanced lifestyle that supports their mental, emotional, and physical health, thereby improving their functional performance in daily life.     Participation Level: Engaged   Participation Quality: Independent   Behavior: Appropriate   Speech/Thought  Process: Relevant   Affect/Mood: Appropriate   Insight: Fair   Judgement: Fair      Modes of Intervention: Education  Patient Response to Interventions:  Attentive   Plan: Continue to engage patient in OT groups 2 - 3x/week.  06/22/2023  Lynnda Sas, OT  Keiva Dina, OT

## 2023-06-23 ENCOUNTER — Encounter (HOSPITAL_COMMUNITY): Payer: Self-pay

## 2023-06-23 DIAGNOSIS — F332 Major depressive disorder, recurrent severe without psychotic features: Secondary | ICD-10-CM | POA: Diagnosis not present

## 2023-06-23 MED ORDER — ARIPIPRAZOLE 5 MG PO TABS
5.0000 mg | ORAL_TABLET | Freq: Every day | ORAL | Status: DC
  Administered 2023-06-23: 5 mg via ORAL
  Filled 2023-06-23: qty 1

## 2023-06-23 MED ORDER — DOXEPIN HCL 25 MG PO CAPS
150.0000 mg | ORAL_CAPSULE | Freq: Every day | ORAL | Status: DC
  Administered 2023-06-23 – 2023-06-26 (×4): 150 mg via ORAL
  Filled 2023-06-23 (×4): qty 6

## 2023-06-23 NOTE — Plan of Care (Signed)
 Problem: Health Behavior/Discharge Planning: Goal: Compliance with treatment plan for underlying cause of condition will improve Outcome: Progressing   Problem: Physical Regulation: Goal: Ability to maintain clinical measurements within normal limits will improve Outcome: Progressing   Problem: Coping: Goal: Coping ability will improve Outcome: Progressing

## 2023-06-23 NOTE — BH IP Treatment Plan (Signed)
 Interdisciplinary Treatment and Diagnostic Plan Update  06/23/2023 Time of Session: 12:25 PM - UPDATE [name] MRN: [id number]  Principal Diagnosis: MDD (major depressive disorder), recurrent episode, severe (HCC)  Secondary Diagnoses: Principal Problem:   MDD (major depressive disorder), recurrent episode, severe (HCC) Active Problems:   PTSD (post-traumatic stress disorder)   Nightmares associated with chronic post-traumatic stress disorder   Current Medications:  Current Facility-Administered Medications  Medication Dose Route Frequency Provider Last Rate Last Admin   acetaminophen  (TYLENOL ) tablet 650 mg  650 mg Oral Q6H PRN Mills, Shnese E, NP   650 mg at 06/22/23 1657   alum & mag hydroxide-simeth (MAALOX/MYLANTA) 200-200-20 MG/5ML suspension 30 mL  30 mL Oral Q4H PRN Mills, Shnese E, NP       amiodarone  (PACERONE ) tablet 200 mg  200 mg Oral Daily Zouev, Dmitri, MD   200 mg at 06/23/23 7829   amLODipine  (NORVASC ) tablet 5 mg  5 mg Oral Daily Parker, Alvin S, MD   5 mg at 06/23/23 5621   ARIPiprazole (ABILIFY) tablet 5 mg  5 mg Oral QHS Timmothy Foots, MD       atorvastatin  (LIPITOR) tablet 40 mg  40 mg Oral Daily Parker, Alvin S, MD   40 mg at 06/23/23 3086   haloperidol  (HALDOL ) tablet 5 mg  5 mg Oral TID PRN Doneen Fuelling, NP       And   diphenhydrAMINE  (BENADRYL ) capsule 50 mg  50 mg Oral TID PRN Mills, Shnese E, NP       haloperidol  lactate (HALDOL ) injection 5 mg  5 mg Intramuscular TID PRN Doneen Fuelling, NP       And   diphenhydrAMINE  (BENADRYL ) injection 50 mg  50 mg Intramuscular TID PRN Doneen Fuelling, NP       And   LORazepam  (ATIVAN ) injection 2 mg  2 mg Intramuscular TID PRN Mills, Shnese E, NP       haloperidol  lactate (HALDOL ) injection 10 mg  10 mg Intramuscular TID PRN [name], NP       And   diphenhydrAMINE  (BENADRYL ) injection 50 mg  50 mg Intramuscular TID PRN [name], NP       And   LORazepam  (ATIVAN ) injection 2 mg  2 mg  Intramuscular TID PRN Mills, Shnese E, NP       divalproex  (DEPAKOTE ) DR tablet 750 mg  750 mg Oral BID Mills, Shnese E, NP   750 mg at 06/23/23 1631   doxepin  (SINEQUAN ) capsule 150 mg  150 mg Oral QHS Timmothy Foots, MD       hydrOXYzine  (ATARAX ) tablet 25 mg  25 mg Oral TID PRN Mills, Shnese E, NP   25 mg at 06/23/23 5784   loperamide  (IMODIUM ) capsule 2 mg  2 mg Oral PRN Parker, Alvin S, MD   2 mg at 06/22/23 1656   magnesium  hydroxide (MILK OF MAGNESIA) suspension 30 mL  30 mL Oral Daily PRN Mills, Shnese E, NP       multivitamin with minerals tablet 1 tablet  1 tablet Oral Daily [name], NP   1 tablet at 06/23/23 6962   prazosin  (MINIPRESS ) capsule 2 mg  2 mg Oral QHS Mills, Shnese E, NP   2 mg at 06/22/23 2125   topiramate  (TOPAMAX ) tablet 50 mg  50 mg Oral QHS Parker, Alvin S, MD   50 mg at 06/22/23 2125   PTA Medications: Medications Prior to Admission  Medication  Sig Dispense Refill Last Dose/Taking   amiodarone  (PACERONE ) 200 MG tablet Take 1 tablet (200 mg total) by mouth daily. For cardiac issues. 30 tablet 0    atorvastatin  (LIPITOR) 20 MG tablet Take 1 tablet (20 mg total) by mouth daily. For hyperlipidemia. 30 tablet 0    divalproex  (DEPAKOTE ) 250 MG DR tablet Take 3 tablets (750 mg total) by mouth 2 (two) times daily. For mood stabilization 180 tablet 0    doxepin  (SINEQUAN ) 50 MG capsule Take 1 capsule (50 mg total) by mouth at bedtime. For sleep 30 capsule 0    escitalopram  (LEXAPRO ) 20 MG tablet Take 1 tablet (20 mg total) by mouth at bedtime. For depression 30 tablet 0    feeding supplement (ENSURE ENLIVE / ENSURE PLUS) LIQD Take 237 mLs by mouth 3 (three) times daily between meals. 237 mL 12    hydrOXYzine  (ATARAX ) 25 MG tablet Take 1 tablet (25 mg total) by mouth 3 (three) times daily as needed for anxiety. 75 tablet 0    lurasidone  (LATUDA ) 20 MG TABS tablet Take 1 tablet (20 mg total) by mouth daily with supper. For mood control 30 tablet 0    melatonin 5 MG  TABS Take 1 tablet (5 mg total) by mouth at bedtime. For sleep 30 tablet 0    metoprolol  succinate (TOPROL -XL) 25 MG 24 hr tablet Take 1 tablet (25 mg total) by mouth daily. For high blood pressure. 30 tablet 0    Multiple Vitamin (MULTIVITAMIN WITH MINERALS) TABS tablet Take 1 tablet by mouth daily. 30 tablet 0    prazosin  (MINIPRESS ) 2 MG capsule Take 1 capsule (2 mg total) by mouth at bedtime. For nightmares. 30 capsule 0    tamsulosin  (FLOMAX ) 0.4 MG CAPS capsule Take 1 capsule (0.4 mg total) by mouth daily after supper. For Prostate health 30 capsule 0     Patient Stressors: Financial difficulties   Loss of housing    Patient Strengths: Average or above average intelligence  Capable of independent living  Communication skills  Motivation for treatment/growth   Treatment Modalities: Medication Management, Group therapy, Case management,  1 to 1 session with clinician, Psychoeducation, Recreational therapy.   Physician Treatment Plan for Primary Diagnosis: MDD (major depressive disorder), recurrent episode, severe (HCC) Long Term Goal(s):     Short Term Goals:    Medication Management: Evaluate patient's response, side effects, and tolerance of medication regimen.  Therapeutic Interventions: 1 to 1 sessions, Unit Group sessions and Medication administration.  Evaluation of Outcomes: Progressing  Physician Treatment Plan for Secondary Diagnosis: Principal Problem:   MDD (major depressive disorder), recurrent episode, severe (HCC) Active Problems:   PTSD (post-traumatic stress disorder)   Nightmares associated with chronic post-traumatic stress disorder  Long Term Goal(s):     Short Term Goals:       Medication Management: Evaluate patient's response, side effects, and tolerance of medication regimen.  Therapeutic Interventions: 1 to 1 sessions, Unit Group sessions and Medication administration.  Evaluation of Outcomes: Progressing   RN Treatment Plan for Primary  Diagnosis: MDD (major depressive disorder), recurrent episode, severe (HCC) Long Term Goal(s): Knowledge of disease and therapeutic regimen to maintain health will improve  Short Term Goals: Ability to remain free from injury will improve, Ability to verbalize frustration and anger appropriately will improve, Ability to verbalize feelings will improve, and Ability to disclose and discuss suicidal ideas  Medication Management: RN will administer medications as ordered by provider, will assess and evaluate patient's response and provide  education to patient for prescribed medication. RN will report any adverse and/or side effects to prescribing provider.  Therapeutic Interventions: 1 on 1 counseling sessions, Psychoeducation, Medication administration, Evaluate responses to treatment, Monitor vital signs and CBGs as ordered, Perform/monitor CIWA, COWS, AIMS and Fall Risk screenings as ordered, Perform wound care treatments as ordered.  Evaluation of Outcomes: Progressing   LCSW Treatment Plan for Primary Diagnosis: MDD (major depressive disorder), recurrent episode, severe (HCC) Long Term Goal(s): Safe transition to appropriate next level of care at discharge, Engage patient in therapeutic group addressing interpersonal concerns.  Short Term Goals: Engage patient in aftercare planning with referrals and resources, Increase ability to appropriately verbalize feelings, Facilitate acceptance of mental health diagnosis and concerns, and Identify triggers associated with mental health/substance abuse issues  Therapeutic Interventions: Assess for all discharge needs, 1 to 1 time with Social worker, Explore available resources and support systems, Assess for adequacy in community support network, Educate family and significant other(s) on suicide prevention, Complete Psychosocial Assessment, Interpersonal group therapy.  Evaluation of Outcomes: Progressing   Progress in Treatment: Attending groups:  No. Participating in groups: No. Taking medication as prescribed: Yes. Toleration medication: Yes. Family/Significant other contact made: Yes, contacted Team Lead Jenelle Mis [phone] Patient understands diagnosis: Yes. Discussing patient identified problems/goals with staff: Yes. Medical problems stabilized or resolved: Yes. Denies suicidal/homicidal ideation: Yes. Issues/concerns per patient self-inventory: No.   New problem(s) identified: No, Describe:  None   New Short Term/Long Term Goal(s): medication stabilization, elimination of SI thoughts, development of comprehensive mental wellness plan.    Patient Goals:  I'm hoping to get into drug rehab   Discharge Plan or Barriers: Patient recently admitted. CSW will continue to follow and assess for appropriate referrals and possible discharge planning.    Reason for Continuation of Hospitalization: Anxiety Depression Suicidal ideation Other; describe mood stabilization, discharge  planning   Estimated Length of Stay: 3 - 4 days  Last 3 [place] Suicide Severity Risk Score: Flowsheet Row [facility] ([facility] (Discharged[facility]  C-SSRS RISK CATEGORY High Risk High Risk Moderate Risk    Last PHQ 2/9 Scores:     No data to display          Scribe for Treatment Team: Kamarrion Stfort O Kannan Proia, LCSWA 06/23/2023 6:55 PM

## 2023-06-23 NOTE — Progress Notes (Signed)
   06/23/23 0200  Psych Admission Type (Psych Patients Only)  Admission Status Voluntary  Psychosocial Assessment  Patient Complaints Anxiety  Eye Contact Fair  Facial Expression Flat;Worried  Affect Anxious  Speech Logical/coherent  Interaction Assertive  Motor Activity Slow  Appearance/Hygiene Unremarkable  Behavior Characteristics Cooperative  Mood Depressed  Aggressive Behavior  Effect No apparent injury  Thought Process  Coherency Circumstantial  Content Blaming self  Delusions WDL  Perception Hallucinations  Hallucination Auditory  Judgment WDL  Confusion WDL  Danger to Self  Current suicidal ideation? Denies  Danger to Others  Danger to Others None reported or observed

## 2023-06-23 NOTE — Plan of Care (Signed)
   Problem: Education: Goal: Emotional status will improve Outcome: Progressing Goal: Mental status will improve Outcome: Progressing   Problem: Activity: Goal: Interest or engagement in activities will improve Outcome: Progressing Goal: Sleeping patterns will improve Outcome: Progressing

## 2023-06-23 NOTE — Progress Notes (Signed)
  [name]   Type of Note: [facility]  ROI sent to [facility] yesterday. Spoke with Kaitlin in admissions this morning who confirmed there needs to be a legal judgement paper signed by a judge stating [name] is allowed to complete and be enrolled in the [facility] program. This can take about 2 weeks per pt's PO Officer Ethyl Hering, she is aware order is needed.   Kaitlin also requesting psych eval/H&P and medication list to be sent. Sent via secure email this morning to [email or web address].   Will continue to assist.  Signed:  Zyonna Vardaman, LCSW-A 06/23/2023  9:39 AM

## 2023-06-23 NOTE — Progress Notes (Addendum)
  [name]   Type of Note: [facility] (CLT)  Spoke with Peterson Brandt at [facility] in admissions, clinicals re faxed this morning to [phone] as they were not received yesterday.   Spoke with [place], CST team lead this morning regarding dispo options, being that the court order from the judge will not be complete likely by time of discharge. [place] will call this Clinical research associate back after discussing with her team on options for discharge. Pt may have to d/c to shelter then transition to treatment.  135pm: Patient given list of homeless shelters in the area. Pt encouraged to call [facility] in [place] and [address] of [place]. Pt aware of possible 2 week gap between discharge and treatment, verbalized understanding. Pt agreeable to making phone calls.  Signed:  Alahna Dunne, LCSW-A 06/23/2023  10:51 AM

## 2023-06-23 NOTE — Group Note (Signed)
 Date:  06/23/2023 Time:  10:55 AM  Group Topic/Focus:  Goals Group:   The focus of this group is to help patients establish daily goals to achieve during treatment and discuss how the patient can incorporate goal setting into their daily lives to aide in recovery.    Participation Level:  Did Not Attend  Participation Quality:    Affect:    Cognitive:    Insight:   Engagement in Group:    Modes of Intervention:    Additional Comments:  Pt were aware of group time. Pt refused to attend.  Tita Form 06/23/2023, 10:55 AM

## 2023-06-23 NOTE — Progress Notes (Signed)
   06/23/23 2105  Psych Admission Type (Psych Patients Only)  Admission Status Voluntary  Psychosocial Assessment  Patient Complaints Anxiety;Depression  Facial Expression Flat;Worried  Affect Depressed  Speech Logical/coherent  Interaction Assertive  Motor Activity Slow  Appearance/Hygiene Unremarkable  Behavior Characteristics Cooperative;Appropriate to situation  Mood Depressed;Anxious;Sad  Thought Process  Coherency Circumstantial  Content Blaming self  Delusions None reported or observed  Perception WDL  Hallucination None reported or observed  Judgment WDL  Confusion None  Danger to Self  Current suicidal ideation? Passive  Self-Injurious Behavior No self-injurious ideation or behavior indicators observed or expressed   Agreement Not to Harm Self Yes  Description of Agreement verbal  Danger to Others  Danger to Others None reported or observed

## 2023-06-23 NOTE — Group Note (Signed)
 Recreation Therapy Group Note   Group Topic:Problem Solving  Group Date: 06/23/2023 Start Time: 4098 End Time: 1000 Facilitators: Quame Spratlin-McCall, LRT,CTRS Location: 400 Hall Dayroom   Group Topic: Problem Solving  Goal Area(s) Addresses:  Patient will effectively work in a team with other group members. Patient will verbalize importance of using appropriate problem solving techniques.   Behavioral Response:   Intervention: Worksheet  Activity: Dentist. Patients were given two worksheets of brain teasers. Patients got 15 minutes to complete the puzzles. Patients could work with each other if they chose to to figure out what each puzzle was. At the end of the 15 minutes, LRT would go over the answers with the group.     Education: Journalist, newspaper, Communication, Team Building  Education Outcome: Acknowledges understanding/In group clarification offered/Needs additional education.    Affect/Mood: N/A   Participation Level: Did not attend    Clinical Observations/Individualized Feedback:     Plan: Continue to engage patient in RT group sessions 2-3x/week.   Alicianna Litchford-McCall, LRT,CTRS 06/23/2023 1:07 PM

## 2023-06-23 NOTE — Group Note (Signed)
 Date:  06/23/2023 Time:  10:24 PM  Group Topic/Focus:  Goals Group:   The focus of this group is to help patients establish daily goals to achieve during treatment and discuss how the patient can incorporate goal setting into their daily lives to aide in recovery. Wrap-Up Group:   The focus of this group is to help patients review their daily goal of treatment and discuss progress on daily workbooks.    Participation Level:  Did Not Attend    Additional Comments:    Joann Mu 06/23/2023, 10:24 PM

## 2023-06-23 NOTE — Progress Notes (Signed)
 [facility] MD Progress Note  06/23/2023 4:44 PM [name]  MRN:  [id number] Principal Problem: MDD (major depressive disorder), recurrent episode, severe (HCC) Diagnosis: Principal Problem:   MDD (major depressive disorder), recurrent episode, severe (HCC) Active Problems:   PTSD (post-traumatic stress disorder)   Nightmares associated with chronic post-traumatic stress disorder   ID & Admission Information: [name] is an 63 y.o. 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  He presented on 06/18/2023  5:47 PM for MDD (major depressive disorder), recurrent episode, severe (HCC).  He presented with suicidal ideation and plan to hang himself.  Subjective:   Case was discussed in the multidisciplinary team. MAR was reviewed and patient was compliant with medications.  No acute events occurred overnight.  The patient was seen in his room during rounds.  He continues to complain of severe depression, high anxiety, insomnia that he states has not improved since admission, and constant suicidal ideation with a plan to hang himself.  He continues to state that he if he were discharged today he would commit suicide by hanging.  He contracts for safety in the hospital.  He denied auditory or visual hallucinations with me, but he reported command auditory hallucinations to kill himself to nursing.  As the patient has no apparent paranoia or delusions, the reports of auditory hallucinations is concerning for secondary gain.  Will continue to monitor the patient closely.  We discussed continuing titration of doxepin .  Will increase another  25 mg tonight.  We also discussed switching Latuda  to Abilify and titrating to effect until he discontinues complaints of auditory hallucinations.  This also gives the option of an LAI.  Patient reports improvement in headaches since Topamax  was increased to 50 mg, so we will keep it at that dose.  He denies nightmares.       Past Psychiatric and Medical Medical History:  Past Medical History:  Diagnosis Date   Anxiety    Arthritis    knees and hands   Bipolar 1 disorder, depressed (HCC)    Bipolar disorder (HCC)    Depression    GERD (gastroesophageal reflux disease)    Hepatitis    HEP C   History of kidney stones    Hypertension    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 echo 5/17: EF 60-65%, no RWMA, LV dias fxn nl, LA mildly dilated, RV sys fxn nl, PASP nl, moderate sized circumferential pericardial effusion was identified, 2.12 cm around the LV free wall, <1 cm around the RV free wall. Features were not c/w tamponade physiology   PTSD (post-traumatic stress disorder)    Witnessed brother's suicide.   Restless leg syndrome    Seizures (HCC)    Syncope     Past Surgical History:  Procedure Laterality Date   APPENDECTOMY     CARDIOVERSION N/A 04/17/2023   Procedure: CARDIOVERSION;  Surgeon: Constancia Delton, MD;  Location: [facility] ORS;  Service: Cardiovascular;  Laterality: N/A;   CYSTOSCOPY WITH URETEROSCOPY AND STENT PLACEMENT     ESOPHAGOGASTRODUODENOSCOPY N/A 01/11/2016   Procedure: ESOPHAGOGASTRODUODENOSCOPY (EGD);  Surgeon: Baldo Bonds, MD;  Location: [facility] ENDOSCOPY;  Service: Endoscopy;  Laterality:  N/A;   ESOPHAGOGASTRODUODENOSCOPY N/A 04/09/2020   Procedure: ESOPHAGOGASTRODUODENOSCOPY (EGD);  Surgeon: Luke Salaam, MD;  Location: [facility] ENDOSCOPY;  Service: Gastroenterology;  Laterality: N/A;   INCISION AND DRAINAGE ABSCESS Left 01/02/2018   Procedure: INCISION AND DRAINAGE LEFT KNEE;  Surgeon:  Marlynn Singer, MD;  Location: [facility] ORS;  Service: Orthopedics;  Laterality: Left;   JOINT REPLACEMENT Right    TKR   KNEE ARTHROSCOPY Right 06/25/2014   Procedure: ARTHROSCOPY KNEE;  Surgeon: Marlynn Singer, MD;  Location: [facility] ORS;  Service: Orthopedics;  Laterality: Right;  partial arthroscopic medial menisectomy   LAPAROSCOPIC APPENDECTOMY N/A 06/02/2021   Procedure: APPENDECTOMY LAPAROSCOPIC;  Surgeon: Flynn Hylan, MD;  Location: [facility] ORS;  Service: General;  Laterality: N/A;   TEE WITHOUT CARDIOVERSION N/A 04/17/2023   Procedure: ECHOCARDIOGRAM, TRANSESOPHAGEAL;  Surgeon: Constancia Delton, MD;  Location: [facility] ORS;  Service: Cardiovascular;  Laterality: N/A;   TOTAL KNEE ARTHROPLASTY Right 04/22/2015   Procedure: TOTAL KNEE ARTHROPLASTY;  Surgeon: Marlynn Singer, MD;  Location: [facility] ORS;  Service: Orthopedics;  Laterality: Right;   TOTAL KNEE ARTHROPLASTY Left 10/30/2017   Procedure: TOTAL KNEE ARTHROPLASTY;  Surgeon: Marlynn Singer, MD;  Location: [facility] ORS;  Service: Orthopedics;  Laterality: Left;   TOTAL KNEE REVISION Left 01/02/2018   Procedure: poly exchange of tibia and patella left knee;  Surgeon: Marlynn Singer, MD;  Location: [facility] ORS;  Service: Orthopedics;  Laterality: Left;   UMBILICAL HERNIA REPAIR  06/02/2021   Procedure: HERNIA REPAIR UMBILICAL ADULT;  Surgeon: Flynn Hylan, MD;  Location: [facility] ORS;  Service: General;;    Family History(Medical and Psychiatric):  Family History  Problem Relation Age of Onset   CVA Mother        deceased at age 25   Depression Brother        Died by suicide at age 107   Social History:  Social History   Substance and Sexual Activity  Alcohol Use Not Currently   Comment: rare     Social History   Substance and Sexual Activity  Drug Use Not Currently   Types: Marijuana, Cocaine   Comment: last use January 2025, cocaine    Social History   Socioeconomic History   Marital status: Single    Spouse name: Not on file    Number of children: Not on file   Years of education: Not on file   Highest education level: Not on file  Occupational History   Not on file  Tobacco Use   Smoking status: Former    Current packs/day: 0.00    Average packs/day: 0.8 packs/day for 20.0 years (15.0 ttl pk-yrs)    Types: Cigarettes    Start date: 05/16/1964    Quit date: 05/16/1984    Years since quitting: 39.1   Smokeless tobacco: Never  Vaping Use   Vaping status: Never Used  Substance and Sexual Activity   Alcohol use: Not Currently    Comment: rare   Drug use: Not Currently    Types: Marijuana, Cocaine    Comment: last use January 2025, cocaine   Sexual activity: Not Currently  Other Topics Concern   Not on file  Social History Narrative   ** Merged History Encounter **       Social Drivers of Health   Financial Resource Strain: Not on file  Food Insecurity: Food Insecurity Present (06/18/2023)   Hunger Vital Sign    Worried About Running Out of Food in the Last Year: Often true    Ran Out of  Food in the Last Year: Often true  Transportation Needs: No Transportation Needs (06/18/2023)   PRAPARE - Administrator, Civil Service (Medical): No    Lack of Transportation (Non-Medical): No  Physical Activity: Not on file  Stress: Not on file  Social Connections: Socially Isolated (02/15/2023)   Social Connection and Isolation Panel    Frequency of Communication with Friends and Family: Once a week    Frequency of Social Gatherings with Friends and Family: Once a week    Attends Religious Services: Never    Database administrator or Organizations: No    Attends Engineer, structural: Never    Marital Status: Never married        Current Medications: Current Facility-Administered Medications  Medication Dose Route Frequency Provider Last Rate Last Admin   acetaminophen  (TYLENOL ) tablet 650 mg  650 mg Oral Q6H PRN Mills, Shnese E, NP   650 mg at 06/22/23 1657   alum & mag hydroxide-simeth  (MAALOX/MYLANTA) 200-200-20 MG/5ML suspension 30 mL  30 mL Oral Q4H PRN Mills, Shnese E, NP       amiodarone  (PACERONE ) tablet 200 mg  200 mg Oral Daily Zouev, Dmitri, MD   200 mg at 06/23/23 8469   amLODipine  (NORVASC ) tablet 5 mg  5 mg Oral Daily Arsema Tusing S, MD   5 mg at 06/23/23 6295   ARIPiprazole (ABILIFY) tablet 5 mg  5 mg Oral QHS Timmothy Foots, MD       atorvastatin  (LIPITOR) tablet 40 mg  40 mg Oral Daily Oden Lindaman S, MD   40 mg at 06/23/23 2841   haloperidol  (HALDOL ) tablet 5 mg  5 mg Oral TID PRN Doneen Fuelling, NP       And   diphenhydrAMINE  (BENADRYL ) capsule 50 mg  50 mg Oral TID PRN Mills, Shnese E, NP       haloperidol  lactate (HALDOL ) injection 5 mg  5 mg Intramuscular TID PRN Doneen Fuelling, NP       And   diphenhydrAMINE  (BENADRYL ) injection 50 mg  50 mg Intramuscular TID PRN Doneen Fuelling, NP       And   LORazepam  (ATIVAN ) injection 2 mg  2 mg Intramuscular TID PRN Mills, Shnese E, NP       haloperidol  lactate (HALDOL ) injection 10 mg  10 mg Intramuscular TID PRN Doneen Fuelling, NP       And   diphenhydrAMINE  (BENADRYL ) injection 50 mg  50 mg Intramuscular TID PRN Doneen Fuelling, NP       And   LORazepam  (ATIVAN ) injection 2 mg  2 mg Intramuscular TID PRN Mills, Shnese E, NP       divalproex  (DEPAKOTE ) DR tablet 750 mg  750 mg Oral BID Mills, Shnese E, NP   750 mg at 06/23/23 1631   doxepin  (SINEQUAN ) capsule 150 mg  150 mg Oral QHS Timmothy Foots, MD       hydrOXYzine  (ATARAX ) tablet 25 mg  25 mg Oral TID PRN Mills, Shnese E, NP   25 mg at 06/23/23 3244   loperamide  (IMODIUM ) capsule 2 mg  2 mg Oral PRN Aijalon Demuro S, MD   2 mg at 06/22/23 1656   magnesium  hydroxide (MILK OF MAGNESIA) suspension 30 mL  30 mL Oral Daily PRN Mills, Shnese E, NP       multivitamin with minerals tablet 1 tablet  1 tablet Oral Daily Doneen Fuelling, NP   1  tablet at 06/23/23 4098   prazosin  (MINIPRESS ) capsule 2 mg  2 mg Oral QHS Mills, Shnese E, NP   2 mg at 06/22/23 2125    topiramate  (TOPAMAX ) tablet 50 mg  50 mg Oral QHS Grayden Burley S, MD   50 mg at 06/22/23 2125    Lab Results:  No results found for this or any previous visit (from the past 48 hours).   Blood Alcohol level:  Lab Results  Component Value Date   [facility] <15 05/29/2023   ETH <10 04/12/2023    Metabolic Disorder Labs: Lab Results  Component Value Date   HGBA1C 4.9 06/01/2023   MPG 93.93 06/01/2023   MPG 111.15 12/29/2022   No results found for: PROLACTIN Lab Results  Component Value Date   CHOL 246 (H) 06/01/2023   TRIG 129 06/01/2023   HDL 37 (L) 06/01/2023   CHOLHDL 6.6 06/01/2023   VLDL 26 06/01/2023   LDLCALC 183 (H) 06/01/2023   LDLCALC 146 (H) 12/29/2022    Physical Findings: AIMS:  , ,  ,  ,    CIWA:    COWS:     Psychiatric Specialty Exam:  Presentation  General Appearance: Disheveled  Eye Contact: Fair  Speech: Clear and Coherent  Speech Volume: Normal  Handedness: Right   Mood and Affect  Mood: Dysphoric; Anxious  Affect: Restricted; Congruent   Thought Process  Thought Processes: Linear  Descriptions of Associations: Intact  Orientation: Full (Time, Place and Person)  Thought Content: Logical  History of Schizophrenia/Schizoaffective disorder: No  Duration of Psychotic Symptoms: NA Hallucinations: Hallucinations: None   Ideas of Reference: None  Suicidal Thoughts: Suicidal Thoughts: Yes, Active SI Active Intent and/or Plan: With Intent   Homicidal Thoughts: Homicidal Thoughts: No    Sensorium  Memory: Immediate Good; Recent Good  Judgment: Poor  Insight: Fair   Art therapist  Concentration: Good  Attention Span: Good  Recall: Good  Fund of Knowledge: Good  Language: Good   Psychomotor Activity  Psychomotor Activity: Psychomotor Activity: Psychomotor Retardation    Assets  Assets: Communication Skills; Resilience   Sleep  Sleep: Sleep: Poor    Musculoskeletal: Strength & Muscle Tone:  within normal limits Gait & Station: normal Patient leans: N/A   Physical Exam: General: Sitting comfortably. NAD. HEENT: Normocephalic, atraumatic, MMM, EMOI Lungs: no increased work of breathing noted Heart: no cyanosis Abdomen: Non distended Musculoskeletal: FROM. No obvious deformities Skin: Warm, dry, intact. No rashes noted Neuro: No obvious focal deficits.  Gait and station are normal  Review of Systems  Constitutional: Negative.   HENT: Negative.    Eyes: Negative.   Respiratory: Negative.    Cardiovascular: Negative.   Gastrointestinal: Negative.   Genitourinary: Negative.   Skin: Negative.   Neurological: Negative.   Psychiatric/Behavioral:  Positive for depression, anxiety, insomnia.     Blood pressure 106/75, pulse 79, temperature 98.5 F (36.9 C), temperature source Oral, resp. rate 17, height 5' 9 (1.753 m), weight 82.6 kg, SpO2 99%. Body mass index is 26.88 kg/m.  ASSESSMENT: [name] is an 63 y.o. 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  He presented on 06/18/2023  5:47 PM for MDD (major depressive disorder), recurrent episode, severe (HCC).  He presented with suicidal ideation and plan to hang himself.  He wants treatment for cocaine abuse.  Diagnoses / Active Problems: Patient Active Problem List   Diagnosis Date Noted   Nightmares associated with chronic post-traumatic stress disorder 06/19/2023   MDD (major depressive disorder), recurrent episode, severe (HCC) 06/18/2023   Severe recurrent major depression with psychotic features (HCC) 06/16/2023   Bipolar 1 disorder, depressed  (HCC) 05/30/2023   Atrial fibrillation with rapid ventricular response (HCC) 04/15/2023   Primary hypertension 04/15/2023   Syncope 04/12/2023   Seizure (HCC) 04/12/2023   Persistent atrial fibrillation (HCC) 04/11/2023   Seizures (HCC) 02/13/2023   Transaminitis 02/13/2023   Polysubstance abuse (HCC) 02/13/2023   Sinus tachycardia 02/13/2023   Rhabdomyolysis 02/12/2023   Mild tetrahydrocannabinol (THC) abuse 12/24/2022   Bipolar disorder, current episode depressed, severe, with psychotic features (HCC) 12/24/2022   Atrial flutter (HCC) 12/21/2022   SVT (supraventricular tachycardia) (HCC) 12/20/2022   PTSD (post-traumatic stress disorder) 09/16/2022   Suicidal ideation 09/15/2022   Malingering 06/28/2021   Cocaine use    Moderate episode of recurrent major depressive disorder (HCC) 05/15/2021   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 Psychiatric Diagnoses and Treatment:  Patient Active Problem List   Diagnosis Date Noted   Nightmares associated with chronic post-traumatic stress disorder 06/19/2023   MDD (major depressive disorder), recurrent episode, severe (HCC) 06/18/2023   Severe recurrent major depression with psychotic features (HCC) 06/16/2023   Bipolar 1 disorder, depressed (HCC) 05/30/2023   Atrial fibrillation with rapid ventricular response (HCC) 04/15/2023   Primary hypertension  04/15/2023   Syncope 04/12/2023   Seizure (HCC) 04/12/2023   Persistent atrial fibrillation (HCC) 04/11/2023   Seizures (HCC) 02/13/2023   Transaminitis 02/13/2023   Polysubstance abuse (HCC) 02/13/2023   Sinus tachycardia 02/13/2023   Rhabdomyolysis 02/12/2023   Mild tetrahydrocannabinol (THC) abuse 12/24/2022   Bipolar disorder, current episode depressed, severe, with psychotic features (HCC) 12/24/2022   Atrial flutter (HCC) 12/21/2022   SVT (supraventricular tachycardia) (HCC) 12/20/2022   PTSD (post-traumatic stress disorder) 09/16/2022   Suicidal ideation 09/15/2022   Malingering 06/28/2021   Cocaine use    Moderate episode of recurrent major depressive disorder (HCC) 05/15/2021   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[facility]) 05/03/2021   Essential hypertension 01/18/2021   Alcohol abuse 01/18/2021   Coronary artery disease of native artery of native heart with stable angina pectoris (HCC)      Scheduled Medications:  amiodarone   200 mg Oral Daily   amLODipine   5 mg Oral Daily   ARIPiprazole  5 mg Oral QHS   atorvastatin   40 mg Oral Daily   divalproex   750 mg Oral BID   doxepin   150 mg Oral QHS   multivitamin with minerals  1 tablet Oral Daily   prazosin   2 mg Oral QHS   topiramate   50 mg Oral QHS    As Needed Medications: acetaminophen , alum &  mag hydroxide-simeth, haloperidol  **AND** diphenhydrAMINE , haloperidol  lactate **AND** diphenhydrAMINE  **AND** LORazepam , haloperidol  lactate **AND** diphenhydrAMINE  **AND** LORazepam , hydrOXYzine , loperamide , magnesium  hydroxide    3. Medical Issues Being Addressed:   -- As above  Labs reviewed, unremarkable with the exception of: Hypocalcemia 8.8, elevated CK4 25, hypercholesterolemia to 46 with LDL of 183, anemia 12.5  4. Discharge Planning:   -- Social work and case management to assist with discharge planning and identification of hospital follow-up needs prior to discharge  -- Estimated LOS: 7 days  -- Discharge  Concerns: Need to establish a safety plan; Medication compliance and effectiveness  -- Discharge Goals: Return home with outpatient referrals for mental health follow-up including medication management/psychotherapy  5. Short Term Goals:  Improve ability to identify changes in lifestyle to reduce recurrence of condition, verbalize feelings, disclose and discuss suicidal ideas, demonstrate self-control, identify and develop effective coping behaviors, compliance with prescribed medications, identify triggers associated with substance abuse/mental health issues, participate in unit milieu and in scheduled group therapies   6. Long Term Goals: Improvement in symptoms so the patient is ready for discharge   --The risks/benefits/side-effects/alternatives to the medications above were discussed in detail with the patient and time was given for questions. The patient provided informed consent.   -- Metabolic profile and EKG monitoring obtained while on an atypical antipsychotic and listed in the EHR    Total Time Spent in Direct Patient Care:  I personally spent 35 minutes on the unit in direct patient care. The direct patient care time included face-to-face time with the patient, reviewing the patient's chart, communicating with other professionals, and coordinating care. Greater than 50% of this time was spent in counseling or coordinating care with the patient regarding goals of hospitalization, psycho-education, and discharge planning needs.      Clair Crews, MD Psychiatrist  06/23/2023, 4:44 PM   I certify that inpatient services furnished can reasonably be expected to improve the patient's condition.    Portions of this note were created using voice recognition software. Minor syntax errors, grammatical content, spelling, or punctuation errors may have occurred unintentionally. Please notify the [facility] if the meaning of any statement is unclear.

## 2023-06-24 DIAGNOSIS — F4312 Post-traumatic stress disorder, chronic: Secondary | ICD-10-CM

## 2023-06-24 DIAGNOSIS — R45851 Suicidal ideations: Secondary | ICD-10-CM

## 2023-06-24 DIAGNOSIS — F515 Nightmare disorder: Secondary | ICD-10-CM

## 2023-06-24 MED ORDER — PRAZOSIN HCL 1 MG PO CAPS
4.0000 mg | ORAL_CAPSULE | Freq: Every day | ORAL | Status: DC
  Administered 2023-06-24 – 2023-07-03 (×10): 4 mg via ORAL
  Filled 2023-06-24 (×10): qty 4
  Filled 2023-06-24: qty 28

## 2023-06-24 MED ORDER — ARIPIPRAZOLE 10 MG PO TABS
10.0000 mg | ORAL_TABLET | Freq: Every day | ORAL | Status: DC
  Administered 2023-06-24 – 2023-06-26 (×3): 10 mg via ORAL
  Filled 2023-06-24 (×3): qty 1

## 2023-06-24 NOTE — Progress Notes (Signed)
 [facility] MD Progress Note  06/24/2023 4:21 PM [name]  MRN:  [id number] Subjective:  Chart reviewed. No significant events overnight. Patient reported sleeping poorly last night. He has been compliant with medications. He reports ongoing nightmares that are distressing and disruptive to sleep. He reports ongoing AH of his brother telling him to kill himself and is distressed by this. He reports an ongoing depressed mood with anhedonia and thoughts of wanting to die and that he may hang himself if he leaves the hospital. He denies any significant side effects from his current medications but also is not convinced that Abilify has thus far been helpful in managing his mood or hallucinations. We discussed his medications and the indications for depakote  (seizures) and topamax  (chronic headaches). He reports not having any seizures in some time and states that the headaches are improved with topamax  but that they still occur in the evenings. He stated that his hope after discharge is to stay at a local shelter at [facility]. He expresses concern and ongoing distress over his chronic homelessness and identifies this as a primary driver of his symptoms.    Principal Problem: MDD (major depressive disorder), recurrent episode, severe (HCC) Diagnosis: Principal Problem:   MDD (major depressive disorder), recurrent episode, severe (HCC) Active Problems:   PTSD (post-traumatic stress disorder)   Nightmares associated with chronic post-traumatic stress disorder  Total Time spent with patient: 15 minutes   Past Medical History:  Past Medical History:  Diagnosis Date   Anxiety    Arthritis    knees and hands   Bipolar 1 disorder, depressed (HCC)    Bipolar disorder (HCC)    Depression    GERD (gastroesophageal reflux disease)    Hepatitis    HEP C   History of kidney stones    Hypertension    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 echo 5/17: EF 60-65%, no RWMA, LV dias fxn nl, LA mildly dilated, RV sys fxn nl, PASP nl, moderate sized circumferential pericardial effusion was identified, 2.12 cm around the LV free wall, <1 cm around the RV free wall. Features were not c/w tamponade physiology   PTSD (post-traumatic stress disorder)    Witnessed brother's suicide.   Restless leg syndrome    Seizures (HCC)    Syncope     Past Surgical History:  Procedure Laterality Date   APPENDECTOMY     CARDIOVERSION N/A 04/17/2023   Procedure: CARDIOVERSION;  Surgeon: Constancia Delton, MD;  Location: [facility] ORS;  Service: Cardiovascular;  Laterality: N/A;   CYSTOSCOPY WITH URETEROSCOPY AND STENT PLACEMENT     ESOPHAGOGASTRODUODENOSCOPY N/A 01/11/2016   Procedure: ESOPHAGOGASTRODUODENOSCOPY (EGD);  Surgeon: Baldo Bonds, MD;  Location: [facility] ENDOSCOPY;  Service: Endoscopy;  Laterality: N/A;   ESOPHAGOGASTRODUODENOSCOPY N/A 04/09/2020   Procedure: ESOPHAGOGASTRODUODENOSCOPY (EGD);  Surgeon: Luke Salaam, MD;  Location: [facility] ENDOSCOPY;  Service: Gastroenterology;  Laterality: N/A;   INCISION AND DRAINAGE ABSCESS Left 01/02/2018   Procedure: INCISION AND DRAINAGE LEFT KNEE;  Surgeon: Marlynn Singer, MD;  Location: [facility] ORS;  Service: Orthopedics;  Laterality: Left;   JOINT REPLACEMENT Right    TKR   KNEE ARTHROSCOPY Right 06/25/2014   Procedure: ARTHROSCOPY KNEE;  Surgeon: Marlynn Singer, MD;  Location: [facility] ORS;  Service: Orthopedics;  Laterality: Right;  partial arthroscopic medial menisectomy   LAPAROSCOPIC APPENDECTOMY N/A 06/02/2021   Procedure: APPENDECTOMY LAPAROSCOPIC;  Surgeon: Flynn Hylan, MD;  Location: [facility] ORS;  Service: General;  Laterality: N/A;   TEE WITHOUT CARDIOVERSION N/A 04/17/2023   Procedure: ECHOCARDIOGRAM, TRANSESOPHAGEAL;  Surgeon: Constancia Delton, MD;  Location: [facility] ORS;  Service: Cardiovascular;  Laterality: N/A;   TOTAL KNEE ARTHROPLASTY Right 04/22/2015   Procedure: TOTAL KNEE ARTHROPLASTY;   Surgeon: Marlynn Singer, MD;  Location: [facility] ORS;  Service: Orthopedics;  Laterality: Right;   TOTAL KNEE ARTHROPLASTY Left 10/30/2017   Procedure: TOTAL KNEE ARTHROPLASTY;  Surgeon: Marlynn Singer, MD;  Location: [facility] ORS;  Service: Orthopedics;  Laterality: Left;   TOTAL KNEE REVISION Left 01/02/2018   Procedure: poly exchange of tibia and patella left knee;  Surgeon: Marlynn Singer, MD;  Location: [facility] ORS;  Service: Orthopedics;  Laterality: Left;   UMBILICAL HERNIA REPAIR  06/02/2021   Procedure: HERNIA REPAIR UMBILICAL ADULT;  Surgeon: Flynn Hylan, MD;  Location: [facility] ORS;  Service: General;;   Family History:  Family History  Problem Relation Age of Onset   CVA Mother        deceased at age 76   Depression Brother        Died by suicide at age 37    Social History:  Social History   Substance and Sexual Activity  Alcohol Use Not Currently   Comment: rare     Social History   Substance and Sexual Activity  Drug Use Not Currently   Types: Marijuana, Cocaine   Comment: last use January 2025, cocaine    Social History   Socioeconomic History   Marital status: Single    Spouse name: Not on file   Number of children: Not on file   Years of education: Not on file   Highest education level: Not on file  Occupational History   Not on file  Tobacco Use   Smoking status: Former    Current packs/day: 0.00    Average packs/day: 0.8 packs/day for 20.0 years (15.0 ttl pk-yrs)    Types: Cigarettes    Start date: 05/16/1964    Quit date: 05/16/1984    Years since quitting: 39.1   Smokeless tobacco: Never  Vaping Use   Vaping status: Never Used  Substance and Sexual Activity   Alcohol use: Not Currently    Comment: rare   Drug use: Not Currently    Types: Marijuana, Cocaine    Comment: last use January 2025, cocaine   Sexual activity: Not Currently  Other Topics Concern   Not on file  Social History Narrative   ** Merged History Encounter **       Social Drivers  of Health   Financial Resource Strain: Not on file  Food Insecurity: Food Insecurity Present (06/18/2023)   Hunger Vital Sign    Worried About Running Out of Food in the Last Year: Often true    Ran Out of Food in the Last Year: Often true  Transportation Needs: No Transportation Needs (06/18/2023)   PRAPARE - Administrator, Civil Service (Medical): No    Lack of Transportation (Non-Medical): No  Physical Activity: Not on file  Stress: Not on file  Social Connections: Socially Isolated (02/15/2023)   Social Connection and Isolation Panel    Frequency of Communication with Friends and Family: Once a week    Frequency of Social Gatherings with Friends and Family: Once a week    Attends Religious Services: Never    Database administrator or Organizations: No    Attends Banker Meetings: Never    Marital Status: Never married  Sleep: Poor Estimated Sleeping Duration (Last 24 Hours): 6.75-8.00 hours  Appetite:  Fair  Current Medications: Current Facility-Administered Medications  Medication Dose Route Frequency Provider Last Rate Last Admin   acetaminophen  (TYLENOL ) tablet 650 mg  650 mg Oral Q6H PRN Mills, Shnese E, NP   650 mg at 06/22/23 1657   alum & mag hydroxide-simeth (MAALOX/MYLANTA) 200-200-20 MG/5ML suspension 30 mL  30 mL Oral Q4H PRN Mills, Shnese E, NP       amiodarone  (PACERONE ) tablet 200 mg  200 mg Oral Daily Zouev, Dmitri, MD   200 mg at 06/24/23 0839   amLODipine  (NORVASC ) tablet 5 mg  5 mg Oral Daily Parker, Alvin S, MD   5 mg at 06/24/23 1610   ARIPiprazole (ABILIFY) tablet 5 mg  5 mg Oral QHS Parker, Alvin S, MD   5 mg at 06/23/23 2105   atorvastatin  (LIPITOR) tablet 40 mg  40 mg Oral Daily Parker, Alvin S, MD   40 mg at 06/24/23 9604   haloperidol  (HALDOL ) tablet 5 mg  5 mg Oral TID PRN Doneen Fuelling, NP       And   diphenhydrAMINE  (BENADRYL ) capsule 50 mg  50 mg Oral TID PRN Mills, Shnese E, NP       haloperidol  lactate (HALDOL )  injection 5 mg  5 mg Intramuscular TID PRN Doneen Fuelling, NP       And   diphenhydrAMINE  (BENADRYL ) injection 50 mg  50 mg Intramuscular TID PRN Doneen Fuelling, NP       And   LORazepam  (ATIVAN ) injection 2 mg  2 mg Intramuscular TID PRN Mills, Shnese E, NP       haloperidol  lactate (HALDOL ) injection 10 mg  10 mg Intramuscular TID PRN Doneen Fuelling, NP       And   diphenhydrAMINE  (BENADRYL ) injection 50 mg  50 mg Intramuscular TID PRN [name], NP       And   LORazepam  (ATIVAN ) injection 2 mg  2 mg Intramuscular TID PRN Mills, Shnese E, NP       divalproex  (DEPAKOTE ) DR tablet 750 mg  750 mg Oral BID Mills, Shnese E, NP   750 mg at 06/24/23 5409   doxepin  (SINEQUAN ) capsule 150 mg  150 mg Oral QHS Parker, Alvin S, MD   150 mg at 06/23/23 2103   hydrOXYzine  (ATARAX ) tablet 25 mg  25 mg Oral TID PRN Mills, Shnese E, NP   25 mg at 06/23/23 2105   loperamide  (IMODIUM ) capsule 2 mg  2 mg Oral PRN Parker, Alvin S, MD   2 mg at 06/22/23 1656   magnesium  hydroxide (MILK OF MAGNESIA) suspension 30 mL  30 mL Oral Daily PRN Mills, Shnese E, NP       multivitamin with minerals tablet 1 tablet  1 tablet Oral Daily [name], NP   1 tablet at 06/24/23 8119   prazosin  (MINIPRESS ) capsule 2 mg  2 mg Oral QHS Mills, Shnese E, NP   2 mg at 06/23/23 2105   topiramate  (TOPAMAX ) tablet 50 mg  50 mg Oral QHS Parker, Alvin S, MD   50 mg at 06/23/23 2104    Lab Results: No results found for this or any previous visit (from the past 48 hours).  Blood Alcohol level:  Lab Results  Component Value Date   [facility] <15 05/29/2023   ETH <10 04/12/2023    Metabolic Disorder Labs: Lab Results  Component Value Date   HGBA1C  4.9 06/01/2023   MPG 93.93 06/01/2023   MPG 111.15 12/29/2022   No results found for: PROLACTIN Lab Results  Component Value Date   CHOL 246 (H) 06/01/2023   TRIG 129 06/01/2023   HDL 37 (L) 06/01/2023   CHOLHDL 6.6 06/01/2023   VLDL 26 06/01/2023   LDLCALC 183 (H)  06/01/2023   LDLCALC 146 (H) 12/29/2022    Physical Findings: AIMS:  ,  ,  ,  ,  ,  ,   CIWA:    COWS:     Musculoskeletal: Strength & Muscle Tone: within normal limits Gait & Station: normal Patient leans: N/A  Psychiatric Specialty Exam:  Presentation  General Appearance:  Appropriate for Environment; Fairly Groomed  Eye Contact: Fair  Speech: Clear and Coherent  Speech Volume: Normal  Handedness: Right   Mood and Affect  Mood: Depressed; Dysphoric  Affect: Restricted   Thought Process  Thought Processes: Coherent; Goal Directed; Linear  Descriptions of Associations:Intact  Orientation:Full (Time, Place and Person)  Thought Content:Logical  History of Schizophrenia/Schizoaffective disorder:No  Duration of Psychotic Symptoms:N/A  Hallucinations:Hallucinations: Auditory  Ideas of Reference:None  Suicidal Thoughts:Suicidal Thoughts: Yes, Active SI Active Intent and/or Plan: With Intent  Homicidal Thoughts:Homicidal Thoughts: No   Sensorium  Memory: Immediate Good; Recent Good  Judgment: Fair  Insight: Fair   Executive Functions  Concentration: Good  Attention Span: Good  Recall: Good  Fund of Knowledge: Good  Language: Good   Psychomotor Activity  Psychomotor Activity: Psychomotor Activity: Psychomotor Retardation   Assets  Assets: Communication Skills; Resilience   Sleep  Sleep: Sleep: Poor    Physical Exam: Physical Exam Vitals and nursing note reviewed.    ROS Blood pressure 103/72, pulse 81, temperature 97.8 F (36.6 C), temperature source Oral, resp. rate 17, height 5' 9 (1.753 m), weight 82.6 kg, SpO2 97%. Body mass index is 26.88 kg/m.   Assessment and Plan: Mr. THANH MOTTERN is a 63 y/o male with previously diagnosed BPAD, PTSD, and seizures, who was admitted on 06/18/2023 for after presenting to the ED with a reported plan to hang himself in the context of chronic homelessness.  Since  admission the patient appears to have made minimal progress as he is still endorses suicidal ideation with thoughts/plan to hang himself.   # BPAD / Depressive symptoms / Suicidal ideation - BPAD appears to be a historical diagnosis. He has been an unreliable historian.  - Reports ongoing depressive symptoms and AH - Increase Abilify to 10 mg at bedtime  # Nightmares - Possibly related to PTSD. This is also a historical diagnosis. - Increase prazosin  to 4 mg at bedtime   # Auditory Hallucinations - Unclear etiology. BPAD vs schizophrenia vs drug induced vs other - Patient does not appear to be actively manic. - Abilify as above  # Disposition - Patient continues to endorse suicidal ideation and is estimated to be at high risk for harm to self - Complicated by chronic homelessness. Ideally he would be able to discharge to a shelter. Will need SW assistance in exploring options as he seems to be at high risk for readmission. - Estimated discharge: ???  Jhonny Moss, MD 06/24/2023, 4:21 PM

## 2023-06-24 NOTE — Plan of Care (Signed)
  Problem: Coping: Goal: Coping ability will improve Outcome: Progressing   Problem: Coping: Goal: Ability to verbalize frustrations and anger appropriately will improve Outcome: Progressing   Problem: Physical Regulation: Goal: Ability to maintain clinical measurements within normal limits will improve Outcome: Progressing

## 2023-06-24 NOTE — Plan of Care (Signed)
  Problem: Education: Goal: Emotional status will improve Outcome: Progressing Goal: Mental status will improve Outcome: Progressing   Problem: Coping: Goal: Ability to demonstrate self-control will improve Outcome: Progressing

## 2023-06-24 NOTE — Progress Notes (Signed)
 On assessment, patient presented with elevated anxiety.  Administered PRN Hydroxyzine  per [facility] per patient request.

## 2023-06-24 NOTE — Progress Notes (Signed)
 Adult Psychoeducational Group Note  Date:  06/24/2023 Time:  10:23 AM  Group Topic/Focus:  Goals Group:   The focus of this group is to help patients establish daily goals to achieve during treatment and discuss how the patient can incorporate goal setting into their daily lives to aide in recovery.  Participation Level:  Active  Participation Quality:  Appropriate  Affect:  Appropriate  Cognitive:  Appropriate  Insight: Appropriate  Engagement in Group:  Engaged  Modes of Intervention:    Additional Comments:  Pt stated he is doing okay.  Pt goal for the day is to get better.  Butch Cashing D 06/24/2023, 10:23 AMPt

## 2023-06-25 NOTE — [facility] Group Notes (Signed)
 [facility] Group Notes:  (Nursing/MHT/Case Management/Adjunct)  Date:  06/25/2023  Time:  12:11 AM  Type of Therapy:  Psychoeducational Skills  Participation Level:  Active  Participation Quality:  Appropriate  Affect:  Appropriate  Cognitive:  Appropriate  Insight:  Appropriate  Engagement in Group:  Developing/Improving  Modes of Intervention:  Education  Summary of Progress/Problems: The patient's goal for today was to begin to feel better which he somewhat accomplished. He rated his day as a 5 out of a possible 10 . He did not share a goal for tomorrow.   Jacklin Zwick S 06/25/2023, 12:11 AM

## 2023-06-25 NOTE — Plan of Care (Signed)
 Nurse discussed anxiety, depression and coping skills with patient.

## 2023-06-25 NOTE — Progress Notes (Signed)
   06/24/23 2349  Psych Admission Type (Psych Patients Only)  Admission Status Voluntary  Psychosocial Assessment  Patient Complaints Anxiety;Depression;Sadness  Eye Contact Fair  Facial Expression Flat;Anxious  Affect Appropriate to circumstance  Speech Logical/coherent  Interaction Assertive  Motor Activity Slow  Appearance/Hygiene Unremarkable  Behavior Characteristics Cooperative  Mood Anxious;Depressed  Thought Process  Coherency WDL  Content WDL  Delusions None reported or observed  Perception WDL  Hallucination None reported or observed  Judgment WDL  Confusion None  Danger to Self  Current suicidal ideation? Passive  Self-Injurious Behavior No self-injurious ideation or behavior indicators observed or expressed   Agreement Not to Harm Self Yes  Description of Agreement verbal  Danger to Others  Danger to Others None reported or observed

## 2023-06-25 NOTE — Plan of Care (Signed)
   Problem: Education: Goal: Emotional status will improve Outcome: Progressing Goal: Mental status will improve Outcome: Progressing   Problem: Activity: Goal: Interest or engagement in activities will improve Outcome: Progressing Goal: Sleeping patterns will improve Outcome: Progressing

## 2023-06-25 NOTE — Group Note (Signed)
 LCSW Group Therapy Note  Group Date: 06/25/2023 Start Time: 1100 End Time: 1200   Type of Therapy and Topic:  Group Therapy - Healthy vs Unhealthy Coping Skills  Participation Level:  Did Not Attend   Description of Group The focus of this group was to determine what unhealthy coping techniques typically are used by group members and what healthy coping techniques would be helpful in coping with various problems. Patients were guided in becoming aware of the differences between healthy and unhealthy coping techniques. Patients were asked to identify 2-3 healthy coping skills they would like to learn to use more effectively.  Therapeutic Goals Patients learned that coping is what human beings do all day long to deal with various situations in their lives Patients defined and discussed healthy vs unhealthy coping techniques Patients identified their preferred coping techniques and identified whether these were healthy or unhealthy Patients determined 2-3 healthy coping skills they would like to become more familiar with and use more often. Patients provided support and ideas to each other   Summary of Patient Progress:   Patient did not attend group.    Therapeutic Modalities Cognitive Behavioral Therapy Motivational Interviewing  Valeen Gartner 06/25/2023  11:53 AM

## 2023-06-25 NOTE — Progress Notes (Signed)
 [facility] MD Progress Note  06/25/2023 6:52 PM [name]  MRN:  [id number] Subjective:  Chart reviewed. No significant events overnight. Slept 8+ hours last night. No behavioral issues. Patient has been med compliant. He reports continuing suicidal ideation without specific plan and continuing AH telling him to kill himself. He states that he continues to find these distressing. He does not feel comfortable discharging at this time. He denies any significant side effects from medications. He reported experiencing another nightmare last night. He has been intermittently visible in the milieu today.   Principal Problem: MDD (major depressive disorder), recurrent episode, severe (HCC) Diagnosis: Principal Problem:   MDD (major depressive disorder), recurrent episode, severe (HCC) Active Problems:   PTSD (post-traumatic stress disorder)   Nightmares associated with chronic post-traumatic stress disorder  Total Time spent with patient: 15 minutes  Current Medications: Current Facility-Administered Medications  Medication Dose Route Frequency Provider Last Rate Last Admin   acetaminophen  (TYLENOL ) tablet 650 mg  650 mg Oral Q6H PRN Mills, Shnese E, NP   650 mg at 06/22/23 1657   alum & mag hydroxide-simeth (MAALOX/MYLANTA) 200-200-20 MG/5ML suspension 30 mL  30 mL Oral Q4H PRN Mills, Shnese E, NP       amiodarone  (PACERONE ) tablet 200 mg  200 mg Oral Daily Zouev, Dmitri, MD   200 mg at 06/25/23 0848   amLODipine  (NORVASC ) tablet 5 mg  5 mg Oral Daily Parker, Alvin S, MD   5 mg at 06/25/23 0847   ARIPiprazole (ABILIFY) tablet 10 mg  10 mg Oral QHS Jhonny Moss, MD   10 mg at 06/24/23 2126   atorvastatin  (LIPITOR) tablet 40 mg  40 mg Oral Daily Parker, Alvin S, MD   40 mg at 06/25/23 0847   haloperidol  (HALDOL ) tablet 5 mg  5 mg Oral TID PRN Doneen Fuelling, NP       And   diphenhydrAMINE  (BENADRYL ) capsule 50 mg  50 mg Oral TID PRN Mills, Shnese E, NP       haloperidol  lactate (HALDOL )  injection 5 mg  5 mg Intramuscular TID PRN Doneen Fuelling, NP       And   diphenhydrAMINE  (BENADRYL ) injection 50 mg  50 mg Intramuscular TID PRN Doneen Fuelling, NP       And   LORazepam  (ATIVAN ) injection 2 mg  2 mg Intramuscular TID PRN Mills, Shnese E, NP       haloperidol  lactate (HALDOL ) injection 10 mg  10 mg Intramuscular TID PRN [name], NP       And   diphenhydrAMINE  (BENADRYL ) injection 50 mg  50 mg Intramuscular TID PRN [name], NP       And   LORazepam  (ATIVAN ) injection 2 mg  2 mg Intramuscular TID PRN Mills, Shnese E, NP       divalproex  (DEPAKOTE ) DR tablet 750 mg  750 mg Oral BID Mills, Shnese E, NP   750 mg at 06/25/23 1657   doxepin  (SINEQUAN ) capsule 150 mg  150 mg Oral QHS Parker, Alvin S, MD   150 mg at 06/24/23 2125   hydrOXYzine  (ATARAX ) tablet 25 mg  25 mg Oral TID PRN Mills, Shnese E, NP   25 mg at 06/24/23 2126   loperamide  (IMODIUM ) capsule 2 mg  2 mg Oral PRN Parker, Alvin S, MD   2 mg at 06/22/23 1656   magnesium  hydroxide (MILK OF MAGNESIA) suspension 30 mL  30 mL Oral Daily PRN Mills, Shnese E,  NP       multivitamin with minerals tablet 1 tablet  1 tablet Oral Daily [name], NP   1 tablet at 06/25/23 0848   prazosin  (MINIPRESS ) capsule 4 mg  4 mg Oral QHS Jhonny Moss, MD   4 mg at 06/24/23 2125   topiramate  (TOPAMAX ) tablet 50 mg  50 mg Oral QHS Parker, Alvin S, MD   50 mg at 06/24/23 2125    Lab Results: No results found for this or any previous visit (from the past 48 hours).  Blood Alcohol level:  Lab Results  Component Value Date   [facility] <15 05/29/2023   ETH <10 04/12/2023    Metabolic Disorder Labs: Lab Results  Component Value Date   HGBA1C 4.9 06/01/2023   MPG 93.93 06/01/2023   MPG 111.15 12/29/2022   No results found for: PROLACTIN Lab Results  Component Value Date   CHOL 246 (H) 06/01/2023   TRIG 129 06/01/2023   HDL 37 (L) 06/01/2023   CHOLHDL 6.6 06/01/2023   VLDL 26 06/01/2023   LDLCALC 183 (H)  06/01/2023   LDLCALC 146 (H) 12/29/2022    Musculoskeletal: Normal gait and station  Psychiatric Specialty Exam:  Presentation  General Appearance:  Appropriate for Environment; Fairly Groomed  Eye Contact: Fair  Speech: Clear and Coherent  Speech Volume: Normal  Handedness: Right   Mood and Affect  Mood: Depressed; Dysphoric  Affect: Restricted   Thought Process  Thought Processes: Coherent; Goal Directed; Linear  Descriptions of Associations:Intact  Orientation:Full (Time, Place and Person)  Thought Content:Logical   Hallucinations:Hallucinations: Auditory  Ideas of Reference:None  Suicidal Thoughts:Suicidal Thoughts: Yes, Active  Homicidal Thoughts:Homicidal Thoughts: No   Sensorium  Memory: Immediate Good; Recent Good  Judgment: Fair  Insight: Fair   Art therapist  Concentration: Good  Attention Span: Good  Recall: Good  Fund of Knowledge: Good  Language: Good   Psychomotor Activity  Psychomotor Activity: No data recorded   Assets  Assets: Communication Skills; Resilience   Sleep  Sleep: Sleep: Poor    Physical Exam: Physical Exam Vitals and nursing note reviewed.    ROS Blood pressure 96/62, pulse 81, temperature 97.8 F (36.6 C), temperature source Oral, resp. rate 17, height 5' 9 (1.753 m), weight 82.6 kg, SpO2 97%. Body mass index is 26.88 kg/m.   Assessment and Plan: [name] is a 63 y/o male with previously diagnosed BPAD, PTSD, and seizures, who was admitted on 06/18/2023 for after presenting to the ED with a reported plan to hang himself in the context of chronic homelessness.  Since admission the patient appears to have made minimal progress as he is still endorses suicidal ideation with thoughts/plan to hang himself.   # BPAD / Depressive symptoms / Suicidal ideation - BPAD appears to be a historical diagnosis. He has been an unreliable historian.  - Reports ongoing  depressive symptoms and AH - Abilify 10 mg at bedtime  # Nightmares - Possibly related to PTSD. This is also a historical diagnosis. - prazosin  4 mg at bedtime   # Auditory Hallucinations - Unclear etiology. BPAD vs schizophrenia vs drug induced vs other - Patient does not appear to be actively manic. - Abilify as above  # Disposition - Patient continues to endorse suicidal ideation and is estimated to be at high risk for harm to self - Complicated by chronic homelessness. Ideally he would be able to discharge to a shelter. Will need SW assistance in exploring options as  he seems to be at high risk for readmission. - Estimated discharge: ???  Jhonny Moss, MD 06/25/2023, 6:52 PM

## 2023-06-25 NOTE — Group Note (Signed)
 Date:  06/25/2023 Time:  9:44 AM  Group Topic/Focus:  Goals Group:   The focus of this group is to help patients establish daily goals to achieve during treatment and discuss how the patient can incorporate goal setting into their daily lives to aide in recovery.    Participation Level:  Did Not Attend

## 2023-06-25 NOTE — Progress Notes (Signed)
 D:  Patient stated he slept off/on during the night.  SI thinking all the time, contracts for safety, no plan.  Denied HI.  Denied A/V hallucinations. A:  Medications administered per MD orders.  Emotional support and encouragement given patient. R:  Safety maintained with 15 minute checks.

## 2023-06-25 NOTE — Plan of Care (Signed)
  Problem: Education: Goal: Emotional status will improve 06/25/2023 2017 by Silvio Dry, RN Outcome: Progressing 06/25/2023 2011 by Silvio Dry, RN Outcome: Progressing Goal: Mental status will improve 06/25/2023 2017 by Silvio Dry, RN Outcome: Progressing 06/25/2023 2011 by Silvio Dry, RN Outcome: Progressing   Problem: Activity: Goal: Interest or engagement in activities will improve 06/25/2023 2017 by Silvio Dry, RN Outcome: Progressing 06/25/2023 2011 by Silvio Dry, RN Outcome: Progressing Goal: Sleeping patterns will improve 06/25/2023 2017 by Silvio Dry, RN Outcome: Progressing 06/25/2023 2011 by Silvio Dry, RN Outcome: Progressing

## 2023-06-26 DIAGNOSIS — F333 Major depressive disorder, recurrent, severe with psychotic symptoms: Secondary | ICD-10-CM | POA: Diagnosis not present

## 2023-06-26 NOTE — Group Note (Signed)
 Recreation Therapy Group Note   Group Topic:Stress Management  Group Date: 06/26/2023 Start Time: 5409 End Time: 1003 Facilitators: Sian Joles-McCall, LRT,CTRS Location: [address] Dayroom   Group Topic: Stress Management   Goal Area(s) Addresses:  Patient will actively participate in stress management techniques presented during session.  Patient will successfully identify benefit of practicing stress management post d/c.   Behavioral Response:   Intervention: Relaxation exercise with ambient sound and script   Activity: Guided Imagery. LRT provided education, instruction, and demonstration on practice of visualization via guided imagery. Patient was asked to participate in the technique introduced during session. LRT debriefed including topics of mindfulness, stress management and specific scenarios each patient could use these techniques. Patients were given suggestions of ways to access scripts post d/c and encouraged to explore Youtube and other apps available on smartphones, tablets, and computers.  Education:  Stress Management, Discharge Planning.   Education Outcome: Acknowledges education   Affect/Mood: N/A   Participation Level: Did not attend    Clinical Observations/Individualized Feedback:     Plan: Continue to engage patient in RT group sessions 2-3x/week.   Joann Kulpa-McCall, LRT,CTRS 06/26/2023 1:15 PM

## 2023-06-26 NOTE — Progress Notes (Signed)
   06/26/23 0800  Psych Admission Type (Psych Patients Only)  Admission Status Voluntary  Psychosocial Assessment  Patient Complaints Anxiety;Depression;Sadness  Eye Contact Fair  Facial Expression Sad  Affect Appropriate to circumstance  Speech Logical/coherent  Interaction Assertive  Motor Activity Slow  Appearance/Hygiene Unremarkable  Behavior Characteristics Cooperative;Appropriate to situation  Mood Depressed;Anxious  Aggressive Behavior  Effect No apparent injury  Thought Process  Coherency WDL  Content WDL  Delusions WDL  Perception WDL  Hallucination None reported or observed  Judgment WDL  Confusion None  Danger to Self  Current suicidal ideation? Passive  Self-Injurious Behavior No self-injurious ideation or behavior indicators observed or expressed   Agreement Not to Harm Self Yes  Description of Agreement Verbal Contract for safety  Danger to Others  Danger to Others None reported or observed

## 2023-06-26 NOTE — Progress Notes (Signed)
  [name]   Type of Note: Discharge planning  Spoke with patient this afternoon regarding progress on shelter beds. Pt reports he called [facility] [employer] with no response. Reports he called [facility] in [place] with no response.   Pt requesting to be discharged to [facility] in [place] if he cannot secure other placement. Pt still interested in [facility] but awaiting judge order to clear him for treatment.  Attempted to contact [place] ([facility] team lead) [phone] with no response to discuss discharge planning further.  Will continue to assist.  Signed:  Maziyah Vessel, LCSW-A 06/26/2023  1:16 PM

## 2023-06-26 NOTE — Group Note (Signed)
 Date:  06/26/2023 Time:  10:32 AM  Group Topic/Focus:  Emotional Education:   The focus of this group is to discuss what feelings/emotions are, and how they are experienced. Goals Group:   The focus of this group is to help patients establish daily goals to achieve during treatment and discuss how the patient can incorporate goal setting into their daily lives to aide in recovery. Orientation:   The focus of this group is to educate the patient on the purpose and policies of crisis stabilization and provide a format to answer questions about their admission.  The group details unit policies and expectations of patients while admitted.    Participation Level:  Active  Participation Quality:  Appropriate  Affect:  Appropriate  Cognitive:  Appropriate  Insight: Appropriate  Engagement in Group:  Engaged  Modes of Intervention:  Discussion  Additional Comments:  Pt goal is to disguise his discharge plan with his Child psychotherapist.   Almarie Arias 06/26/2023, 10:32 AM

## 2023-06-26 NOTE — Plan of Care (Signed)
  Problem: Coping: Goal: Ability to verbalize frustrations and anger appropriately will improve Outcome: Progressing   Problem: Activity: Goal: Interest or engagement in activities will improve Outcome: Progressing   Problem: Physical Regulation: Goal: Ability to maintain clinical measurements within normal limits will improve Outcome: Progressing

## 2023-06-26 NOTE — Progress Notes (Signed)
   06/26/23 2215  Psych Admission Type (Psych Patients Only)  Admission Status Voluntary  Psychosocial Assessment  Patient Complaints Sadness  Eye Contact Fair  Facial Expression Sad  Affect Appropriate to circumstance  Speech Logical/coherent  Interaction Assertive  Motor Activity Slow  Appearance/Hygiene Unremarkable  Behavior Characteristics Cooperative  Mood Depressed  Aggressive Behavior  Effect No apparent injury  Thought Process  Coherency WDL  Content WDL  Delusions WDL  Perception WDL  Hallucination None reported or observed  Judgment WDL  Confusion None  Danger to Self  Current suicidal ideation? Passive  Self-Injurious Behavior Some self-injurious ideation observed or expressed.  No lethal plan expressed   Agreement Not to Harm Self Yes  Description of Agreement verbal contract for safety  Danger to Others  Danger to Others None reported or observed

## 2023-06-26 NOTE — Group Note (Signed)
 Date:  06/26/2023 Time:  1:19 AM  Group Topic/Focus:  Wrap-Up Group:   The focus of this group is to help patients review their daily goal of treatment and discuss progress on daily workbooks.    Participation Level:  Minimal  Participation Quality:  Appropriate  Affect:  Appropriate  Cognitive:  Appropriate  Insight: Improving  Engagement in Group:  Limited  Modes of Intervention:  Discussion  Additional Comments:  Pt attended the evening wrap-up group. Tech introduced the staff for the evening, reminded group of the evening schedule and reminded them to ask for anything they need. Pt read and discussed a poem named Autobiography in [employer]. Pt actively listened.   [name] 06/26/2023, 1:19 AM

## 2023-06-26 NOTE — Plan of Care (Signed)
   Problem: Education: Goal: Emotional status will improve Outcome: Progressing Goal: Mental status will improve Outcome: Progressing   Problem: Activity: Goal: Interest or engagement in activities will improve Outcome: Progressing Goal: Sleeping patterns will improve Outcome: Progressing

## 2023-06-27 DIAGNOSIS — F333 Major depressive disorder, recurrent, severe with psychotic symptoms: Secondary | ICD-10-CM | POA: Diagnosis not present

## 2023-06-27 MED ORDER — ARIPIPRAZOLE 5 MG PO TABS
5.0000 mg | ORAL_TABLET | Freq: Every day | ORAL | Status: DC
  Administered 2023-06-27 – 2023-07-03 (×7): 5 mg via ORAL
  Filled 2023-06-27: qty 14
  Filled 2023-06-27 (×7): qty 1

## 2023-06-27 MED ORDER — DOXEPIN HCL 25 MG PO CAPS
75.0000 mg | ORAL_CAPSULE | Freq: Every day | ORAL | Status: DC
  Administered 2023-06-27 – 2023-06-29 (×3): 75 mg via ORAL
  Filled 2023-06-27 (×3): qty 3

## 2023-06-27 MED ORDER — FLUOXETINE HCL 20 MG PO CAPS
20.0000 mg | ORAL_CAPSULE | Freq: Every day | ORAL | Status: DC
  Administered 2023-06-27 – 2023-06-30 (×4): 20 mg via ORAL
  Filled 2023-06-27 (×4): qty 1

## 2023-06-27 NOTE — Group Note (Signed)
 Recreation Therapy Group Note   Group Topic:Animal Assisted Therapy   Group Date: 06/27/2023 Start Time: 6295 End Time: 1035 Facilitators: Taleeyah Bora-McCall, LRT,CTRS Location: [address] Dayroom   Animal-Assisted Activity (AAA) Program Checklist/Progress Notes Patient Eligibility Criteria Checklist & Daily Group note for Rec Tx Intervention  AAA/T Program Assumption of Risk Form signed by Patient/ or Parent Legal Guardian Yes  Patient understands his/her participation is voluntary Yes  Behavioral Response:    Education: Charity fundraiser, Appropriate Animal Interaction   Education Outcome: Acknowledges education.    Affect/Mood: N/A   Participation Level: Did not attend    Clinical Observations/Individualized Feedback:    Plan: Continue to engage patient in RT group sessions 2-3x/week.   Lacy Taglieri-McCall, LRT,CTRS  06/27/2023 1:32 PM

## 2023-06-27 NOTE — Group Note (Signed)
 Date:  06/27/2023 Time:  10:58 AM  Group Topic/Focus:  Coping With Mental Health Crisis:   The purpose of this group is to help patients identify strategies for coping with mental health crisis.  Group discusses possible causes of crisis and ways to manage them effectively. Goals Group:   The focus of this group is to help patients establish daily goals to achieve during treatment and discuss how the patient can incorporate goal setting into their daily lives to aide in recovery. Orientation:   The focus of this group is to educate the patient on the purpose and policies of crisis stabilization and provide a format to answer questions about their admission.  The group details unit policies and expectations of patients while admitted.    Participation Level:  Did Not Attend  [name] 06/27/2023, 10:58 AM

## 2023-06-27 NOTE — Progress Notes (Signed)
   06/27/23 0800  Psych Admission Type (Psych Patients Only)  Admission Status Voluntary  Psychosocial Assessment  Patient Complaints Sadness;Depression  Eye Contact Fair  Facial Expression Sad  Affect Appropriate to circumstance  Speech Logical/coherent  Interaction Assertive  Motor Activity Slow  Appearance/Hygiene Unremarkable  Behavior Characteristics Cooperative;Appropriate to situation  Mood Depressed  Aggressive Behavior  Effect No apparent injury  Thought Process  Coherency WDL  Content WDL  Delusions WDL  Perception WDL  Hallucination None reported or observed  Judgment WDL  Confusion None  Danger to Self  Current suicidal ideation? Passive  Agreement Not to Harm Self Yes  Description of Agreement Verbal contracts for safety  Danger to Others  Danger to Others None reported or observed

## 2023-06-27 NOTE — Plan of Care (Signed)
   Problem: Education: Goal: Emotional status will improve Outcome: Progressing Goal: Mental status will improve Outcome: Progressing   Problem: Safety: Goal: Periods of time without injury will increase Outcome: Progressing

## 2023-06-27 NOTE — Progress Notes (Signed)
 [facility] MD Progress Note  06/27/2023 11:25 AM [name]  MRN:  [id number] Subjective:   63 year old Caucasian male, single, homeless, unemployed. Background history of MDD recurrent, anxiety disorder, alcohol use disorder in remission and multiple medical comorbidities.  Presented to the hospital by EMS.  Reported to have had seizures.  Reported history of worsening depression associated with thoughts of hanging himself.  Patient was recently stabilized and discharged from our unit.  He had not been adherent with his psychotropic medications.  Chart reviewed.  Patient discussed at multidisciplinary team meeting.  Nursing staff reports that patient slept for over 7 hours.  He eats his meals.  He is not interacting much with the milieu.  He is mostly in his room.  Seen today.  Patient continues to report feeling of hopelessness or worthlessness.  He continues to report thoughts in his head/voices in his head telling him there is no point going on.  States that he does not have any resources to fall back on in the community.  He feels hopeless or worthless.  He however denies any intent to harm himself at this point.  I explored the use of activating SSRI fluoxetine  with him.  He consented after reviewing the pros and cons.  I have encouraged him to keep ventilating his feelings to staff.  I have also encouraged him to participate with unit groups and therapeutic activities.   Principal Problem: MDD (major depressive disorder), recurrent episode, severe (HCC) Diagnosis: Principal Problem:   MDD (major depressive disorder), recurrent episode, severe (HCC) Active Problems:   PTSD (post-traumatic stress disorder)   Nightmares associated with chronic post-traumatic stress disorder  Total Time spent with patient: 30 minutes  Past Psychiatric History:  See H&P  Past Medical History:  Past Medical History:  Diagnosis Date   Anxiety    Arthritis    knees and hands   Bipolar 1 disorder,  depressed (HCC)    Bipolar disorder (HCC)    Depression    GERD (gastroesophageal reflux disease)    Hepatitis    HEP C   History of kidney stones    Hypertension    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 echo 5/17: EF 60-65%, no RWMA, LV dias fxn nl, LA mildly dilated, RV sys fxn nl, PASP nl, moderate sized circumferential pericardial effusion was identified, 2.12 cm around the LV free wall, <1 cm around the RV free wall. Features were not c/w tamponade physiology   PTSD (post-traumatic stress disorder)    Witnessed brother's suicide.   Restless leg syndrome    Seizures (HCC)    Syncope     Past Surgical History:  Procedure Laterality Date   APPENDECTOMY     CARDIOVERSION N/A 04/17/2023   Procedure: CARDIOVERSION;  Surgeon: Constancia Delton, MD;  Location: [facility] ORS;  Service: Cardiovascular;  Laterality: N/A;   CYSTOSCOPY WITH URETEROSCOPY AND STENT PLACEMENT     ESOPHAGOGASTRODUODENOSCOPY N/A 01/11/2016   Procedure: ESOPHAGOGASTRODUODENOSCOPY (EGD);  Surgeon: Baldo Bonds, MD;  Location: [facility] ENDOSCOPY;  Service: Endoscopy;  Laterality: N/A;   ESOPHAGOGASTRODUODENOSCOPY N/A 04/09/2020   Procedure: ESOPHAGOGASTRODUODENOSCOPY (EGD);  Surgeon: Luke Salaam, MD;  Location: [facility] ENDOSCOPY;  Service: Gastroenterology;  Laterality: N/A;   INCISION AND DRAINAGE ABSCESS Left 01/02/2018   Procedure: INCISION AND DRAINAGE LEFT KNEE;  Surgeon: Marlynn Singer, MD;  Location: [facility] ORS;  Service: Orthopedics;  Laterality: Left;   JOINT REPLACEMENT Right    TKR  KNEE ARTHROSCOPY Right 06/25/2014   Procedure: ARTHROSCOPY KNEE;  Surgeon: Marlynn Singer, MD;  Location: [facility] ORS;  Service: Orthopedics;  Laterality: Right;  partial arthroscopic medial menisectomy   LAPAROSCOPIC APPENDECTOMY N/A 06/02/2021   Procedure: APPENDECTOMY LAPAROSCOPIC;  Surgeon: Flynn Hylan, MD;  Location: [facility] ORS;  Service: General;  Laterality: N/A;    TEE WITHOUT CARDIOVERSION N/A 04/17/2023   Procedure: ECHOCARDIOGRAM, TRANSESOPHAGEAL;  Surgeon: Constancia Delton, MD;  Location: [facility] ORS;  Service: Cardiovascular;  Laterality: N/A;   TOTAL KNEE ARTHROPLASTY Right 04/22/2015   Procedure: TOTAL KNEE ARTHROPLASTY;  Surgeon: Marlynn Singer, MD;  Location: [facility] ORS;  Service: Orthopedics;  Laterality: Right;   TOTAL KNEE ARTHROPLASTY Left 10/30/2017   Procedure: TOTAL KNEE ARTHROPLASTY;  Surgeon: Marlynn Singer, MD;  Location: [facility] ORS;  Service: Orthopedics;  Laterality: Left;   TOTAL KNEE REVISION Left 01/02/2018   Procedure: poly exchange of tibia and patella left knee;  Surgeon: Marlynn Singer, MD;  Location: [facility] ORS;  Service: Orthopedics;  Laterality: Left;   UMBILICAL HERNIA REPAIR  06/02/2021   Procedure: HERNIA REPAIR UMBILICAL ADULT;  Surgeon: Flynn Hylan, MD;  Location: [facility] ORS;  Service: General;;   Family History:  Family History  Problem Relation Age of Onset   CVA Mother        deceased at age 41   Depression Brother        Died by suicide at age 71   Family Psychiatric  History:  See H&P Social History:  Social History   Substance and Sexual Activity  Alcohol Use Not Currently   Comment: rare     Social History   Substance and Sexual Activity  Drug Use Not Currently   Types: Marijuana, Cocaine   Comment: last use January 2025, cocaine    Social History   Socioeconomic History   Marital status: Single    Spouse name: Not on file   Number of children: Not on file   Years of education: Not on file   Highest education level: Not on file  Occupational History   Not on file  Tobacco Use   Smoking status: Former    Current packs/day: 0.00    Average packs/day: 0.8 packs/day for 20.0 years (15.0 ttl pk-yrs)    Types: Cigarettes    Start date: 05/16/1964    Quit date: 05/16/1984    Years since quitting: 39.1   Smokeless tobacco: Never  Vaping Use   Vaping status: Never Used  Substance and Sexual  Activity   Alcohol use: Not Currently    Comment: rare   Drug use: Not Currently    Types: Marijuana, Cocaine    Comment: last use January 2025, cocaine   Sexual activity: Not Currently  Other Topics Concern   Not on file  Social History Narrative   ** Merged History Encounter **       Social Drivers of Health   Financial Resource Strain: Not on file  Food Insecurity: Food Insecurity Present (06/18/2023)   Hunger Vital Sign    Worried About Running Out of Food in the Last Year: Often true    Ran Out of Food in the Last Year: Often true  Transportation Needs: No Transportation Needs (06/18/2023)   PRAPARE - Administrator, Civil Service (Medical): No    Lack of Transportation (Non-Medical): No  Physical Activity: Not on file  Stress: Not on file  Social Connections: Socially Isolated (02/15/2023)   Social Connection and Isolation Panel    Frequency of  Communication with Friends and Family: Once a week    Frequency of Social Gatherings with Friends and Family: Once a week    Attends Religious Services: Never    Database administrator or Organizations: No    Attends Engineer, structural: Never    Marital Status: Never married     Current Medications: Current Facility-Administered Medications  Medication Dose Route Frequency Provider Last Rate Last Admin   acetaminophen  (TYLENOL ) tablet 650 mg  650 mg Oral Q6H PRN Mills, Shnese E, NP   650 mg at 06/22/23 1657   alum & mag hydroxide-simeth (MAALOX/MYLANTA) 200-200-20 MG/5ML suspension 30 mL  30 mL Oral Q4H PRN Mills, Shnese E, NP   30 mL at 06/25/23 2352   amiodarone  (PACERONE ) tablet 200 mg  200 mg Oral Daily Zouev, Dmitri, MD   200 mg at 06/27/23 0836   amLODipine  (NORVASC ) tablet 5 mg  5 mg Oral Daily Parker, Alvin S, MD   5 mg at 06/27/23 0836   ARIPiprazole (ABILIFY) tablet 10 mg  10 mg Oral QHS Jhonny Moss, MD   10 mg at 06/26/23 2100   atorvastatin  (LIPITOR) tablet 40 mg  40 mg Oral Daily Parker,  Alvin S, MD   40 mg at 06/27/23 9528   haloperidol  (HALDOL ) tablet 5 mg  5 mg Oral TID PRN Doneen Fuelling, NP       And   diphenhydrAMINE  (BENADRYL ) capsule 50 mg  50 mg Oral TID PRN Mills, Shnese E, NP       haloperidol  lactate (HALDOL ) injection 5 mg  5 mg Intramuscular TID PRN Doneen Fuelling, NP       And   diphenhydrAMINE  (BENADRYL ) injection 50 mg  50 mg Intramuscular TID PRN Doneen Fuelling, NP       And   LORazepam  (ATIVAN ) injection 2 mg  2 mg Intramuscular TID PRN Mills, Shnese E, NP       haloperidol  lactate (HALDOL ) injection 10 mg  10 mg Intramuscular TID PRN Doneen Fuelling, NP       And   diphenhydrAMINE  (BENADRYL ) injection 50 mg  50 mg Intramuscular TID PRN [name], NP       And   LORazepam  (ATIVAN ) injection 2 mg  2 mg Intramuscular TID PRN Mills, Shnese E, NP       divalproex  (DEPAKOTE ) DR tablet 750 mg  750 mg Oral BID Mills, Shnese E, NP   750 mg at 06/27/23 4132   doxepin  (SINEQUAN ) capsule 150 mg  150 mg Oral QHS Parker, Alvin S, MD   150 mg at 06/26/23 2100   hydrOXYzine  (ATARAX ) tablet 25 mg  25 mg Oral TID PRN Mills, Shnese E, NP   25 mg at 06/26/23 2100   loperamide  (IMODIUM ) capsule 2 mg  2 mg Oral PRN Parker, Alvin S, MD   2 mg at 06/22/23 1656   magnesium  hydroxide (MILK OF MAGNESIA) suspension 30 mL  30 mL Oral Daily PRN Mills, Shnese E, NP       multivitamin with minerals tablet 1 tablet  1 tablet Oral Daily [name], NP   1 tablet at 06/27/23 4401   prazosin  (MINIPRESS ) capsule 4 mg  4 mg Oral QHS Jhonny Moss, MD   4 mg at 06/26/23 2100   topiramate  (TOPAMAX ) tablet 50 mg  50 mg Oral QHS Parker, Alvin S, MD   50 mg at 06/26/23 2100    Lab Results: No results found  for this or any previous visit (from the past 48 hours).  Blood Alcohol level:  Lab Results  Component Value Date   [facility] <15 05/29/2023   ETH <10 04/12/2023    Metabolic Disorder Labs: Lab Results  Component Value Date   HGBA1C 4.9 06/01/2023   MPG 93.93 06/01/2023    MPG 111.15 12/29/2022   No results found for: PROLACTIN Lab Results  Component Value Date   CHOL 246 (H) 06/01/2023   TRIG 129 06/01/2023   HDL 37 (L) 06/01/2023   CHOLHDL 6.6 06/01/2023   VLDL 26 06/01/2023   LDLCALC 183 (H) 06/01/2023   LDLCALC 146 (H) 12/29/2022    Physical Findings: AIMS:  ,  ,  ,  ,  ,  ,   CIWA:    COWS:     Musculoskeletal: Strength & Muscle Tone: within normal limits Gait & Station: normal Patient leans: N/A  Psychiatric Specialty Exam:  Presentation  General Appearance:  Limited grooming, in bed, not in any distress, moderate rapport.  No EPS  Eye Contact: Moderate  Speech: Spontaneous.  Soft spoken.  Mood and Affect  Mood: Depressed.  Affect: Blunted and mood congruent.  Thought Process  Thought Processes: Linear and goal directed.  Descriptions of Associations:Intact  Orientation:Full (Time, Place and Person)  Thought Content: Fleeting suicidal thoughts no intent.  Feeling of hopelessness and worthlessness.  Negative and guilty ruminations.  No homicidal thoughts.  No thoughts of violence.  No delusional theme.  No obsessions.  Hallucinations: No hallucination in any modality.  Sensorium  Memory: Good.  Judgment: Good.  Insight: Good  Executive Functions  Concentration: Good.  Attention Span: Good.  Recall: Good.  Fund of Knowledge: Good.  Language: Good   Psychomotor Activity  Psychomotor retardation.    Physical Exam: Physical Exam ROS Blood pressure 91/69, pulse 78, temperature 97.6 F (36.4 C), temperature source Oral, resp. rate 16, height 5' 9 (1.753 m), weight 82.6 kg, SpO2 97%. Body mass index is 26.88 kg/m.   Treatment Plan Summary: Patient remains pervasively depressed.  He continues to express hopelessness and worthlessness.  He continues to endorse suicidal ideation though no current intent.  We will adjust his medicines as below and evaluate him further.  1.  Fluoxetine  20  mg daily.  Will titrate as needed/tolerated. 2.  Decrease aripiprazole to 5 mg daily. 3.  Depakote  750 mg twice daily. 4.  Prazosin  4 mg at bedtime. 5.  Decrease doxepin  to 75 mg at bedtime.  We will taper off in a couple of days. 6.  Continue to encourage unit groups and therapeutic activities. 7.  Continue to monitor mood behavior and interaction with others. 8.  Social worker will coordinate discharge and aftercare planning.   Amelie Jury, MD 06/27/2023, 11:25 AM

## 2023-06-27 NOTE — Group Note (Unsigned)
 Date:  06/27/2023 Time:  8:30 PM  Group Topic/Focus:  Wrap-Up Group:   The focus of this group is to help patients review their daily goal of treatment and discuss progress on daily workbooks.     Participation Level:  {BHH PARTICIPATION ZOXWR:60454}  Participation Quality:  {BHH PARTICIPATION QUALITY:22265}  Affect:  {BHH AFFECT:22266}  Cognitive:  {BHH COGNITIVE:22267}  Insight: {BHH Insight2:20797}  Engagement in Group:  {BHH ENGAGEMENT IN UJWJX:91478}  Modes of Intervention:  {BHH MODES OF INTERVENTION:22269}  Additional Comments:  ***  Debi Fall 06/27/2023, 8:30 PM

## 2023-06-27 NOTE — Progress Notes (Signed)
 [facility] MD Progress Note  06/27/2023 11:10 AM [name]  MRN:  [id number] Subjective:   63 year old Caucasian male, single, homeless, unemployed. Background history of MDD recurrent, anxiety disorder, alcohol use disorder in remission and multiple medical comorbidities.  Presented to the hospital by EMS.  Reported to have had seizures.  Reported history of worsening depression associated with thoughts of hanging himself.  Patient was recently stabilized and discharged from our unit.  He had not been adherent with his psychotropic medications.  I assumed care of this patient today.  Chart reviewed.  Patient discussed at multidisciplinary team meeting.  Nursing staff reports that patient has been mostly isolative in his room.  He slept for 6.7 hours.  No challenging behavior.  Limited participation with groups and therapeutic activities.  Seen today.  Patient is tolerating her medicines again.  He tells me that he has nothing out there for him.  States [place].  [facility]  He continues to report thoughts in his head telling him to end it all.  He has not noticed any difference with doxepin .  Principal Problem: MDD (major depressive disorder), recurrent episode, severe (HCC) Diagnosis: Principal Problem:   MDD (major depressive disorder), recurrent episode, severe (HCC) Active Problems:   PTSD (post-traumatic stress disorder)   Nightmares associated with chronic post-traumatic stress disorder  Total Time spent with patient: 30 minutes  Past Psychiatric History:  See H&P  Past Medical History:  Past Medical History:  Diagnosis Date   Anxiety    Arthritis    knees and hands   Bipolar 1 disorder, depressed (HCC)    Bipolar disorder (HCC)    Depression    GERD (gastroesophageal reflux disease)    Hepatitis    HEP C   History of kidney stones    Hypertension    Infection of prosthetic left knee joint (HCC)  02/06/2018   Kidney stones    Pericarditis 05/2015   a. echo 5/17: EF 60-65%, no RWMA, LV dias fxn nl, LA mildly dilated, RV sys fxn nl, PASP nl, moderate sized circumferential pericardial effusion was identified, 2.12 cm around the LV free wall, <1 cm around the RV free wall. Features were not c/w tamponade physiology   PTSD (post-traumatic stress disorder)    Witnessed brother's suicide.   Restless leg syndrome    Seizures (HCC)    Syncope     Past Surgical History:  Procedure Laterality Date   APPENDECTOMY     CARDIOVERSION N/A 04/17/2023   Procedure: CARDIOVERSION;  Surgeon: Constancia Delton, MD;  Location: [facility] ORS;  Service: Cardiovascular;  Laterality: N/A;   CYSTOSCOPY WITH URETEROSCOPY AND STENT PLACEMENT     ESOPHAGOGASTRODUODENOSCOPY N/A 01/11/2016   Procedure: ESOPHAGOGASTRODUODENOSCOPY (EGD);  Surgeon: Baldo Bonds, MD;  Location[facility] ENDOSCOPY;  Service: Endoscopy;  Laterality: N/A;   ESOPHAGOGASTRODUODENOSCOPY N/A 04/09/2020   Procedure: ESOPHAGOGASTRODUODENOSCOPY (EGD);  Surgeon: Luke Salaam, MD;  Location: [facility] ENDOSCOPY;  Service: Gastroenterology;  Laterality: N/A;   INCISION AND DRAINAGE ABSCESS Left 01/02/2018   Procedure: INCISION AND DRAINAGE LEFT KNEE;  Surgeon: Marlynn Singer, MD;  Location: [facility] ORS;  Service: Orthopedics;  Laterality: Left;   JOINT REPLACEMENT Right    TKR   KNEE ARTHROSCOPY Right 06/25/2014   Procedure: ARTHROSCOPY KNEE;  Surgeon: Marlynn Singer, MD;  Location: [facility] ORS;  Service: Orthopedics;  Laterality: Right;  partial arthroscopic medial menisectomy   LAPAROSCOPIC APPENDECTOMY N/A 06/02/2021   Procedure: APPENDECTOMY LAPAROSCOPIC;  Surgeon: Flynn Hylan, MD;  Location: [facility] ORS;  Service: General;  Laterality: N/A;   TEE WITHOUT CARDIOVERSION N/A 04/17/2023   Procedure: ECHOCARDIOGRAM, TRANSESOPHAGEAL;  Surgeon: Constancia Delton, MD;  Location: [facility] ORS;  Service: Cardiovascular;  Laterality: N/A;   TOTAL KNEE ARTHROPLASTY Right  04/22/2015   Procedure: TOTAL KNEE ARTHROPLASTY;  Surgeon: Marlynn Singer, MD;  Location: [facility] ORS;  Service: Orthopedics;  Laterality: Right;   TOTAL KNEE ARTHROPLASTY Left 10/30/2017   Procedure: TOTAL KNEE ARTHROPLASTY;  Surgeon: Marlynn Singer, MD;  Location: [facility] ORS;  Service: Orthopedics;  Laterality: Left;   TOTAL KNEE REVISION Left 01/02/2018   Procedure: poly exchange of tibia and patella left knee;  Surgeon: Marlynn Singer, MD;  Location: [facility] ORS;  Service: Orthopedics;  Laterality: Left;   UMBILICAL HERNIA REPAIR  06/02/2021   Procedure: HERNIA REPAIR UMBILICAL ADULT;  Surgeon: Flynn Hylan, MD;  Location: [facility] ORS;  Service: General;;   Family History:  Family History  Problem Relation Age of Onset   CVA Mother        deceased at age 21   Depression Brother        Died by suicide at age 73   Family Psychiatric  History:  See H&P Social History:  Social History   Substance and Sexual Activity  Alcohol Use Not Currently   Comment: rare     Social History   Substance and Sexual Activity  Drug Use Not Currently   Types: Marijuana, Cocaine   Comment: last use January 2025, cocaine    Social History   Socioeconomic History   Marital status: Single    Spouse name: Not on file   Number of children: Not on file   Years of education: Not on file   Highest education level: Not on file  Occupational History   Not on file  Tobacco Use   Smoking status: Former    Current packs/day: 0.00    Average packs/day: 0.8 packs/day for 20.0 years (15.0 ttl pk-yrs)    Types: Cigarettes    Start date: 05/16/1964    Quit date: 05/16/1984    Years since quitting: 39.1   Smokeless tobacco: Never  Vaping Use   Vaping status: Never Used  Substance and Sexual Activity   Alcohol use: Not Currently    Comment: rare   Drug use: Not Currently    Types: Marijuana, Cocaine    Comment: last use January 2025, cocaine   Sexual activity: Not Currently  Other Topics Concern   Not on  file  Social History Narrative   ** Merged History Encounter **       Social Drivers of Health   Financial Resource Strain: Not on file  Food Insecurity: Food Insecurity Present (06/18/2023)   Hunger Vital Sign    Worried About Running Out of Food in the Last Year: Often true    Ran Out of Food in the Last Year: Often true  Transportation Needs: No Transportation Needs (06/18/2023)   PRAPARE - Administrator, Civil Service (Medical): No    Lack of Transportation (Non-Medical): No  Physical Activity: Not on file  Stress: Not on file  Social Connections: Socially Isolated (02/15/2023)   Social Connection and Isolation Panel    Frequency of Communication with Friends and Family: Once a week    Frequency of Social Gatherings with Friends and Family: Once a week    Attends Religious Services: Never    Database administrator or Organizations: No  Attends Banker Meetings: Never    Marital Status: Never married     Current Medications: Current Facility-Administered Medications  Medication Dose Route Frequency Provider Last Rate Last Admin   acetaminophen  (TYLENOL ) tablet 650 mg  650 mg Oral Q6H PRN Mills, Shnese E, NP   650 mg at 06/22/23 1657   alum & mag hydroxide-simeth (MAALOX/MYLANTA) 200-200-20 MG/5ML suspension 30 mL  30 mL Oral Q4H PRN Mills, Shnese E, NP   30 mL at 06/25/23 2352   amiodarone  (PACERONE ) tablet 200 mg  200 mg Oral Daily Zouev, Dmitri, MD   200 mg at 06/27/23 0836   amLODipine  (NORVASC ) tablet 5 mg  5 mg Oral Daily Parker, Alvin S, MD   5 mg at 06/27/23 0836   ARIPiprazole (ABILIFY) tablet 10 mg  10 mg Oral QHS Jhonny Moss, MD   10 mg at 06/26/23 2100   atorvastatin  (LIPITOR) tablet 40 mg  40 mg Oral Daily Parker, Alvin S, MD   40 mg at 06/27/23 1610   haloperidol  (HALDOL ) tablet 5 mg  5 mg Oral TID PRN Doneen Fuelling, NP       And   diphenhydrAMINE  (BENADRYL ) capsule 50 mg  50 mg Oral TID PRN Mills, Shnese E, NP       haloperidol   lactate (HALDOL ) injection 5 mg  5 mg Intramuscular TID PRN Doneen Fuelling, NP       And   diphenhydrAMINE  (BENADRYL ) injection 50 mg  50 mg Intramuscular TID PRN Doneen Fuelling, NP       And   LORazepam  (ATIVAN ) injection 2 mg  2 mg Intramuscular TID PRN Mills, Shnese E, NP       haloperidol  lactate (HALDOL ) injection 10 mg  10 mg Intramuscular TID PRN Doneen Fuelling, NP       And   diphenhydrAMINE  (BENADRYL ) injection 50 mg  50 mg Intramuscular TID PRN [name], NP       And   LORazepam  (ATIVAN ) injection 2 mg  2 mg Intramuscular TID PRN Mills, Shnese E, NP       divalproex  (DEPAKOTE ) DR tablet 750 mg  750 mg Oral BID Mills, Shnese E, NP   750 mg at 06/27/23 9604   doxepin  (SINEQUAN ) capsule 150 mg  150 mg Oral QHS Parker, Alvin S, MD   150 mg at 06/26/23 2100   hydrOXYzine  (ATARAX ) tablet 25 mg  25 mg Oral TID PRN Mills, Shnese E, NP   25 mg at 06/26/23 2100   loperamide  (IMODIUM ) capsule 2 mg  2 mg Oral PRN Parker, Alvin S, MD   2 mg at 06/22/23 1656   magnesium  hydroxide (MILK OF MAGNESIA) suspension 30 mL  30 mL Oral Daily PRN Mills, Shnese E, NP       multivitamin with minerals tablet 1 tablet  1 tablet Oral Daily [name], NP   1 tablet at 06/27/23 5409   prazosin  (MINIPRESS ) capsule 4 mg  4 mg Oral QHS Jhonny Moss, MD   4 mg at 06/26/23 2100   topiramate  (TOPAMAX ) tablet 50 mg  50 mg Oral QHS Parker, Alvin S, MD   50 mg at 06/26/23 2100    Lab Results: No results found for this or any previous visit (from the past 48 hours).  Blood Alcohol level:  Lab Results  Component Value Date   [facility] <15 05/29/2023   ETH <10 04/12/2023    Metabolic Disorder Labs: Lab Results  Component  Value Date   HGBA1C 4.9 06/01/2023   MPG 93.93 06/01/2023   MPG 111.15 12/29/2022   No results found for: PROLACTIN Lab Results  Component Value Date   CHOL 246 (H) 06/01/2023   TRIG 129 06/01/2023   HDL 37 (L) 06/01/2023   CHOLHDL 6.6 06/01/2023   VLDL 26 06/01/2023    LDLCALC 183 (H) 06/01/2023   LDLCALC 146 (H) 12/29/2022    Physical Findings: AIMS:  ,  ,  ,  ,  ,  ,   CIWA:    COWS:     Musculoskeletal: Strength & Muscle Tone: within normal limits Gait & Station: normal Patient leans: N/A  Psychiatric Specialty Exam:  Presentation  General Appearance:  Limited grooming, in bed, not in any distress, moderate rapport.  No EPS  Eye Contact: Moderate  Speech: Spontaneous.  Soft spoken.  Mood and Affect  Mood: Depressed.  Affect: Blunted and mood congruent.  Thought Process  Thought Processes: Linear and goal directed.  Descriptions of Associations:Intact  Orientation:Full (Time, Place and Person)  Thought Content: Fleeting suicidal thoughts no intent.  Feeling of hopelessness and worthlessness.  Negative and guilty ruminations.  No homicidal thoughts.  No thoughts of violence.  No delusional theme.  No obsessions.  Hallucinations: No hallucination in any modality.  Sensorium  Memory: Good.  Judgment: Good.  Insight: Good  Executive Functions  Concentration: Good.  Attention Span: Good.  Recall: Good.  Fund of Knowledge: Good.  Language: Good   Psychomotor Activity  Psychomotor retardation.    Physical Exam: Physical Exam ROS Blood pressure 91/69, pulse 78, temperature 97.6 F (36.4 C), temperature source Oral, resp. rate 16, height 5' 9 (1.753 m), weight 82.6 kg, SpO2 97%. Body mass index is 26.88 kg/m.   Treatment Plan Summary: Patient with history of depression which has been exacerbated by recent life events.  He has become homeless following institutionalization of his ex girlfriend.  He has had medication adjustments but has not followed up as recommended.  He is still pervasively down.  He is currently on doxepin  which is potentially fatal at overdose.  We will explore switching him to an SSRI.  We will continue his medicine in the meantime.  1.  Doxepin  150 mg at bedtime. 2.   Aripiprazole 10 mg daily. 3.  Depakote  750 mg twice daily. 4.  Prazosin  4 mg at bedtime. 5.  Continue medical medications at home dose. 6.  Continue to encourage unit groups and therapeutic activities. 7.  Continue to monitor mood behavior and interaction with others. 8.  Social worker will coordinate discharge and aftercare planning.   Amelie Jury, MD 06/27/2023, 11:10 AM

## 2023-06-27 NOTE — Progress Notes (Signed)
   06/27/23 1945  Psych Admission Type (Psych Patients Only)  Admission Status Voluntary  Psychosocial Assessment  Patient Complaints Sadness;Depression  Eye Contact Fair  Facial Expression Sad  Affect Appropriate to circumstance  Speech Logical/coherent  Interaction Assertive  Motor Activity Slow  Appearance/Hygiene Unremarkable  Behavior Characteristics Appropriate to situation  Mood Depressed  Aggressive Behavior  Effect No apparent injury  Thought Process  Coherency WDL  Content WDL  Delusions WDL  Perception WDL  Hallucination None reported or observed  Judgment WDL  Confusion None  Danger to Self  Current suicidal ideation? Passive  Self-Injurious Behavior Some self-injurious ideation observed or expressed.  No lethal plan expressed   Agreement Not to Harm Self Yes  Description of Agreement verbal contract for safety  Danger to Others  Danger to Others None reported or observed

## 2023-06-27 NOTE — Progress Notes (Signed)
 Pt up stating he could not sleep, pt Bp re-checked 118/81 , pt given the 4 mg Minipress  per [facility] that was held earlier

## 2023-06-27 NOTE — Group Note (Signed)
 LCSW Group Therapy Note   Group Date: 06/27/2023 Start Time: 1100 End Time: 1200   Participation:  did not attend  Type of Therapy:  Group Therapy  Topic:  Understanding Your Path to Change  Objective:  The goal is to help individuals understand the stages of change, identify where they currently are in the process, and provide actionable next steps to continue moving forward in their journey of change.  Goals: Learn about the six stages of change:  Precontemplation, Contemplation, Preparation, Action, Maintenance, and Relapse Reflect on Current Change Efforts:  Recognize which stage participants are in regarding a personal change. Plan Next Steps for Moving Forward:  Create an action plan based on their current stage of change.  Class Summary:  In this session, we explored the Stages of Change as a framework to understand the process of change.  We discussed how each stage helps individuals recognize where they are in their personal journey and used the Stages of Change Worksheet for self-reflection. Participants answered questions to better understand their current stage, challenges, and progress. We also emphasized the importance of moving forward, even if setbacks (Relapse) occur, and created actionable steps to help participants continue progressing. By the end of the session, participants gained a clearer understanding of their path to change and left with a clear plan for next steps.  Therapeutic Modalities:  Elements of CBT (cognitive restructuring, problem solving)  Element of DBT (mindfulness, distress tolerance)   Mariza Bourget O Gricel Copen, LCSWA 06/27/2023  12:30 PM

## 2023-06-27 NOTE — Progress Notes (Signed)
 Pt Minipress  held due to pt Bp 109/ 65 after Gatorade and re-check it was 103/76 .

## 2023-06-27 NOTE — Plan of Care (Signed)
   Problem: Activity: Goal: Interest or engagement in activities will improve Outcome: Progressing   Problem: Coping: Goal: Ability to verbalize frustrations and anger appropriately will improve Outcome: Progressing   Problem: Safety: Goal: Periods of time without injury will increase Outcome: Progressing

## 2023-06-28 ENCOUNTER — Encounter (HOSPITAL_COMMUNITY): Payer: Self-pay

## 2023-06-28 DIAGNOSIS — F333 Major depressive disorder, recurrent, severe with psychotic symptoms: Secondary | ICD-10-CM | POA: Diagnosis not present

## 2023-06-28 NOTE — Group Note (Signed)
 Recreation Therapy Group Note   Group Topic:Other  Group Date: 06/28/2023 Start Time: 1412 End Time: 1457 Facilitators: Scarlett Portlock-McCall, LRT,CTRS Location: [address] Dayroom   Activity Description/Intervention: Therapeutic Drumming. Patients with peers and staff were given the opportunity to engage in a leader facilitated [facility] Group Empowerment Drumming Circle with staff from the [employer], in partnership with [employer]. Teaching laboratory technician and trained [employer], Kathlyne Parchment leading with LRT observing and documenting intervention and pt response. This evidenced-based practice targets 7 areas of health and wellbeing in the human experience including: stress-reduction, exercise, self-expression, camaraderie/support, nurturing, spirituality, and music-making (leisure).   Goal Area(s) Addresses:  Patient will engage in pro-social way in music group.  Patient will follow directions of drum leader on the first prompt. Patient will demonstrate no behavioral issues during group.  Patient will identify if a reduction in stress level occurs as a result of participation in therapeutic drum circle.    Education: Leisure exposure, Pharmacologist, Musical expression, Discharge Planning   Affect/Mood: N/A   Participation Level: Did not attend    Clinical Observations/Individualized Feedback:     Plan: Continue to engage patient in RT group sessions 2-3x/week.   Taneasha Fuqua-McCall, LRT,CTRS 06/28/2023 4:10 PM

## 2023-06-28 NOTE — Plan of Care (Signed)
  Problem: Activity: Goal: Interest or engagement in activities will improve Outcome: Progressing Goal: Sleeping patterns will improve Outcome: Progressing   Problem: Safety: Goal: Periods of time without injury will increase Outcome: Progressing

## 2023-06-28 NOTE — Group Note (Signed)
 Date:  06/28/2023 Time:  11:08 AM  Group Topic/Focus:  Goals Group:   The focus of this group is to help patients establish daily goals to achieve during treatment and discuss how the patient can incorporate goal setting into their daily lives to aide in recovery.    Participation Level:  Active  Participation Quality:  Appropriate and Attentive  Affect:  Appropriate  Cognitive:  Alert and Appropriate  Insight: Appropriate and Good  Engagement in Group:  Engaged  Modes of Intervention:  Discussion  Additional Comments:  Pt state his goal were to speak with his social worker or doctor about discharge.  Tita Form 06/28/2023, 11:08 AM

## 2023-06-28 NOTE — Group Note (Unsigned)
 Date:  06/28/2023 Time:  9:07 AM  Group Topic/Focus:  Goals Group:   The focus of this group is to help patients establish daily goals to achieve during treatment and discuss how the patient can incorporate goal setting into their daily lives to aide in recovery.     Participation Level:  {BHH PARTICIPATION ZOXWR:60454}  Participation Quality:  {BHH PARTICIPATION QUALITY:22265}  Affect:  {BHH AFFECT:22266}  Cognitive:  {BHH COGNITIVE:22267}  Insight: {BHH Insight2:20797}  Engagement in Group:  {BHH ENGAGEMENT IN UJWJX:91478}  Modes of Intervention:  {BHH MODES OF INTERVENTION:22269}  Additional Comments:  ***  Tita Form 06/28/2023, 9:07 AM

## 2023-06-28 NOTE — BHH Group Notes (Signed)
 Spirituality Group   Description: Participant directed exploration of values, beliefs and meaning   Following a brief framework of chaplain's role and ground rules of group behavior, participants are invited to share concerns or questions that engage spiritual life. Emphasis placed on common themes and shared experiences and ways to make meaning and clarify living into one's values.   Theory/Process/Goal: Utilize the theoretical framework of group therapy established by Derrell Flight, Relational Cultural Theory and Rogerian approaches to facilitate relational empathy and use of the "here and now" to foster reflection, self-awareness, and sharing.   Observations: [place]ven was present for most of group and actively contributed to the discussion.  Gayatri Teasdale L. Minetta Aly, M.Div [phone]

## 2023-06-28 NOTE — Plan of Care (Signed)
   Problem: Education: Goal: Emotional status will improve Outcome: Progressing Goal: Mental status will improve Outcome: Progressing   Problem: Activity: Goal: Sleeping patterns will improve Outcome: Progressing   Problem: Safety: Goal: Periods of time without injury will increase Outcome: Progressing

## 2023-06-28 NOTE — BH IP Treatment Plan (Signed)
 Interdisciplinary Treatment and Diagnostic Plan Update  06/28/2023 Time of Session: THIS IS AN UPDATE [name] MRN: [id number]  Principal Diagnosis: MDD (major depressive disorder), recurrent episode, severe (HCC)  Secondary Diagnoses: Principal Problem:   MDD (major depressive disorder), recurrent episode, severe (HCC) Active Problems:   PTSD (post-traumatic stress disorder)   Nightmares associated with chronic post-traumatic stress disorder   Current Medications:  Current Facility-Administered Medications  Medication Dose Route Frequency Provider Last Rate Last Admin   acetaminophen  (TYLENOL ) tablet 650 mg  650 mg Oral Q6H PRN Mills, Shnese E, NP   650 mg at 06/22/23 1657   alum & mag hydroxide-simeth (MAALOX/MYLANTA) 200-200-20 MG/5ML suspension 30 mL  30 mL Oral Q4H PRN Mills, Shnese E, NP   30 mL at 06/27/23 1619   amiodarone  (PACERONE ) tablet 200 mg  200 mg Oral Daily Zouev, Dmitri, MD   200 mg at 06/27/23 0836   amLODipine  (NORVASC ) tablet 5 mg  5 mg Oral Daily Parker, Alvin S, MD   5 mg at 06/27/23 0836   ARIPiprazole (ABILIFY) tablet 5 mg  5 mg Oral QHS [name], [name], MD   5 mg at 06/27/23 2111   atorvastatin  (LIPITOR) tablet 40 mg  40 mg Oral Daily Parker, Alvin S, MD   40 mg at 06/28/23 0730   haloperidol  (HALDOL ) tablet 5 mg  5 mg Oral TID PRN Doneen Fuelling, NP       And   diphenhydrAMINE  (BENADRYL ) capsule 50 mg  50 mg Oral TID PRN Mills, Shnese E, NP       haloperidol  lactate (HALDOL ) injection 5 mg  5 mg Intramuscular TID PRN Doneen Fuelling, NP       And   diphenhydrAMINE  (BENADRYL ) injection 50 mg  50 mg Intramuscular TID PRN Doneen Fuelling, NP       And   LORazepam  (ATIVAN ) injection 2 mg  2 mg Intramuscular TID PRN Mills, Shnese E, NP       haloperidol  lactate (HALDOL ) injection 10 mg  10 mg Intramuscular TID PRN Doneen Fuelling, NP       And   diphenhydrAMINE  (BENADRYL ) injection 50 mg  50 mg Intramuscular TID PRN [name], NP       And    LORazepam  (ATIVAN ) injection 2 mg  2 mg Intramuscular TID PRN Mills, Shnese E, NP       divalproex  (DEPAKOTE ) DR tablet 750 mg  750 mg Oral BID Mills, Shnese E, NP   750 mg at 06/28/23 0730   doxepin  (SINEQUAN ) capsule 75 mg  75 mg Oral QHS [name], Iline Mallory, MD   75 mg at 06/27/23 2111   FLUoxetine  (PROZAC ) capsule 20 mg  20 mg Oral Daily [name], [name], MD   20 mg at 06/28/23 0730   hydrOXYzine  (ATARAX ) tablet 25 mg  25 mg Oral TID PRN Mills, Shnese E, NP   25 mg at 06/27/23 2111   loperamide  (IMODIUM ) capsule 2 mg  2 mg Oral PRN Parker, Alvin S, MD   2 mg at 06/22/23 1656   magnesium  hydroxide (MILK OF MAGNESIA) suspension 30 mL  30 mL Oral Daily PRN Mills, Shnese E, NP       multivitamin with minerals tablet 1 tablet  1 tablet Oral Daily [name], NP   1 tablet at 06/28/23 0730   prazosin  (MINIPRESS ) capsule 4 mg  4 mg Oral QHS Jhonny Moss, MD   4 mg at 06/27/23 2232  topiramate  (TOPAMAX ) tablet 50 mg  50 mg Oral QHS Parker, Alvin S, MD   50 mg at 06/27/23 2111   PTA Medications: Medications Prior to Admission  Medication Sig Dispense Refill Last Dose/Taking   amiodarone  (PACERONE ) 200 MG tablet Take 1 tablet (200 mg total) by mouth daily. For cardiac issues. 30 tablet 0    atorvastatin  (LIPITOR) 20 MG tablet Take 1 tablet (20 mg total) by mouth daily. For hyperlipidemia. 30 tablet 0    divalproex  (DEPAKOTE ) 250 MG DR tablet Take 3 tablets (750 mg total) by mouth 2 (two) times daily. For mood stabilization 180 tablet 0    doxepin  (SINEQUAN ) 50 MG capsule Take 1 capsule (50 mg total) by mouth at bedtime. For sleep 30 capsule 0    escitalopram  (LEXAPRO ) 20 MG tablet Take 1 tablet (20 mg total) by mouth at bedtime. For depression 30 tablet 0    feeding supplement (ENSURE ENLIVE / ENSURE PLUS) LIQD Take 237 mLs by mouth 3 (three) times daily between meals. 237 mL 12    hydrOXYzine  (ATARAX ) 25 MG tablet Take 1 tablet (25 mg total) by mouth 3 (three) times daily as needed for  anxiety. 75 tablet 0    lurasidone  (LATUDA ) 20 MG TABS tablet Take 1 tablet (20 mg total) by mouth daily with supper. For mood control 30 tablet 0    melatonin 5 MG TABS Take 1 tablet (5 mg total) by mouth at bedtime. For sleep 30 tablet 0    metoprolol  succinate (TOPROL -XL) 25 MG 24 hr tablet Take 1 tablet (25 mg total) by mouth daily. For high blood pressure. 30 tablet 0    Multiple Vitamin (MULTIVITAMIN WITH MINERALS) TABS tablet Take 1 tablet by mouth daily. 30 tablet 0    prazosin  (MINIPRESS ) 2 MG capsule Take 1 capsule (2 mg total) by mouth at bedtime. For nightmares. 30 capsule 0    tamsulosin  (FLOMAX ) 0.4 MG CAPS capsule Take 1 capsule (0.4 mg total) by mouth daily after supper. For Prostate health 30 capsule 0     Patient Stressors: Financial difficulties   Loss of housing    Patient Strengths: Average or above average intelligence  Capable of independent living  Communication skills  Motivation for treatment/growth   Treatment Modalities: Medication Management, Group therapy, Case management,  1 to 1 session with clinician, Psychoeducation, Recreational therapy.   Physician Treatment Plan for Primary Diagnosis: MDD (major depressive disorder), recurrent episode, severe (HCC) Long Term Goal(s):     Short Term Goals:    Medication Management: Evaluate patient's response, side effects, and tolerance of medication regimen.  Therapeutic Interventions: 1 to 1 sessions, Unit Group sessions and Medication administration.  Evaluation of Outcomes: Progressing  Physician Treatment Plan for Secondary Diagnosis: Principal Problem:   MDD (major depressive disorder), recurrent episode, severe (HCC) Active Problems:   PTSD (post-traumatic stress disorder)   Nightmares associated with chronic post-traumatic stress disorder  Long Term Goal(s):     Short Term Goals:       Medication Management: Evaluate patient's response, side effects, and tolerance of medication  regimen.  Therapeutic Interventions: 1 to 1 sessions, Unit Group sessions and Medication administration.  Evaluation of Outcomes: Progressing   RN Treatment Plan for Primary Diagnosis: MDD (major depressive disorder), recurrent episode, severe (HCC) Long Term Goal(s): Knowledge of disease and therapeutic regimen to maintain health will improve  Short Term Goals: Ability to remain free from injury will improve, Ability to verbalize feelings will improve, Ability to disclose and discuss  suicidal ideas, and Ability to identify and develop effective coping behaviors will improve  Medication Management: RN will administer medications as ordered by provider, will assess and evaluate patient's response and provide education to patient for prescribed medication. RN will report any adverse and/or side effects to prescribing provider.  Therapeutic Interventions: 1 on 1 counseling sessions, Psychoeducation, Medication administration, Evaluate responses to treatment, Monitor vital signs and CBGs as ordered, Perform/monitor CIWA, COWS, AIMS and Fall Risk screenings as ordered, Perform wound care treatments as ordered.  Evaluation of Outcomes: Progressing   LCSW Treatment Plan for Primary Diagnosis: MDD (major depressive disorder), recurrent episode, severe (HCC) Long Term Goal(s): Safe transition to appropriate next level of care at discharge, Engage patient in therapeutic group addressing interpersonal concerns.  Short Term Goals: Engage patient in aftercare planning with referrals and resources, Increase social support, Increase ability to appropriately verbalize feelings, and Increase skills for wellness and recovery  Therapeutic Interventions: Assess for all discharge needs, 1 to 1 time with Social worker, Explore available resources and support systems, Assess for adequacy in community support network, Educate family and significant other(s) on suicide prevention, Complete Psychosocial Assessment,  Interpersonal group therapy.  Evaluation of Outcomes: Progressing   Progress in Treatment: Attending groups: No. Participating in groups: No. Taking medication as prescribed: Yes. Toleration medication: Yes. Family/Significant other contact made: Yes, contacted Team Lead Jenelle Mis [phone] Patient understands diagnosis: Yes. Discussing patient identified problems/goals with staff: Yes. Medical problems stabilized or resolved: Yes. Denies suicidal/homicidal ideation: Yes. Issues/concerns per patient self-inventory: No.   New problem(s) identified: No, Describe:  None   New Short Term/Long Term Goal(s): medication stabilization, elimination of SI thoughts, development of comprehensive mental wellness plan.    Patient Goals:  I'm hoping to get into drug rehab   Discharge Plan or Barriers: Patient recently admitted. CSW will continue to follow and assess for appropriate referrals and possible discharge planning.    Reason for Continuation of Hospitalization: Anxiety Depression Suicidal ideation Other; describe mood stabilization, discharge  planning   Estimated Length of Stay: 3 - 5 days   Last 3 [place] Suicide Severity Risk Score: Flowsheet Row [facility] ([facility]16/2023 in [facility] Ad[facility] (Discharged[facility]  C-SSRS RISK CATEGORY High Risk High Risk Moderate Risk    Last PHQ 2/9 Scores:     No data to display          Scribe for Treatment Team: Vonzell Guerin, LCSWA 06/28/2023 9:16 AM

## 2023-06-28 NOTE — BHH Group Notes (Signed)
 Adult Psychoeducational Group Note  Date:  06/28/2023 Time:  9:13 PM  Group Topic/Focus:  Wrap-Up Group:   The focus of this group is to help patients review their daily goal of treatment and discuss progress on daily workbooks.  Participation Level:  Did Not Attend  Dal Dubin 06/28/2023, 9:13 PM

## 2023-06-28 NOTE — Progress Notes (Signed)
 [facility] MD Progress Note  06/28/2023 3:01 PM [name]  MRN:  [id number] Subjective:   63 year old Caucasian male, single, homeless, unemployed. Background history of MDD recurrent, anxiety disorder, alcohol use disorder in remission and multiple medical comorbidities.  Presented to the hospital by EMS.  Reported to have had seizures.  Reported history of worsening depression associated with thoughts of hanging himself.  Patient was recently stabilized and discharged from our unit.  He had not been adherent with his psychotropic medications.  Chart reviewed.  Patient discussed at multidisciplinary team meeting.  Nursing staff reports that patient slept for 4.5 hours.  He has been more alert today.  He is out in the milieu attending groups.  He continues to express hopelessness and worthlessness.  No observed response to internal stimuli.  Seen today.  Patient states that his sleep was broken yesterday by staff come into checking on him.  He has tolerated recent adjustments made well.  He feels more energized today.  States that he is considering staying with a brother who has offered to housing.  Patient continues to ruminate his losses over the years.  States that he still has visual images of his brother killing himself and another family member dying from oxygen explosion.  He continues to feel hopeless or worthless.  He reports suicidal thoughts but no intent to act.  No associated manic symptoms.  No associated psychotic symptoms. Encouraged to keep ventilating his feelings to staff.   Principal Problem: MDD (major depressive disorder), recurrent episode, severe (HCC) Diagnosis: Principal Problem:   MDD (major depressive disorder), recurrent episode, severe (HCC) Active Problems:   PTSD (post-traumatic stress disorder)   Nightmares associated with chronic post-traumatic stress disorder  Total Time spent with patient: 30 minutes  Past Psychiatric History:  See H&P  Past Medical  History:  Past Medical History:  Diagnosis Date   Anxiety    Arthritis    knees and hands   Bipolar 1 disorder, depressed (HCC)    Bipolar disorder (HCC)    Depression    GERD (gastroesophageal reflux disease)    Hepatitis    HEP C   History of kidney stones    Hypertension    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 echo 5/17: EF 60-65%, no RWMA, LV dias fxn nl, LA mildly dilated, RV sys fxn nl, PASP nl, moderate sized circumferential pericardial effusion was identified, 2.12 cm around the LV free wall, <1 cm around the RV free wall. Features were not c/w tamponade physiology   PTSD (post-traumatic stress disorder)    Witnessed brother's suicide.   Restless leg syndrome    Seizures (HCC)    Syncope     Past Surgical History:  Procedure Laterality Date   APPENDECTOMY     CARDIOVERSION N/A 04/17/2023   Procedure: CARDIOVERSION;  Surgeon: Constancia Delton, MD;  Location: [facility] ORS;  Service: Cardiovascular;  Laterality: N/A;   CYSTOSCOPY WITH URETEROSCOPY AND STENT PLACEMENT     ESOPHAGOGASTRODUODENOSCOPY N/A 01/11/2016   Procedure: ESOPHAGOGASTRODUODENOSCOPY (EGD);  Surgeon: Baldo Bonds, MD;  Location: [facility] ENDOSCOPY;  Service: Endoscopy;  Laterality: N/A;   ESOPHAGOGASTRODUODENOSCOPY N/A 04/09/2020   Procedure: ESOPHAGOGASTRODUODENOSCOPY (EGD);  Surgeon: Luke Salaam, MD;  Location: [facility] ENDOSCOPY;  Service: Gastroenterology;  Laterality: N/A;   INCISION AND DRAINAGE ABSCESS Left 01/02/2018   Procedure: INCISION AND DRAINAGE LEFT KNEE;  Surgeon: Marlynn Singer, MD;  Location: [facility] ORS;  Service: Orthopedics;  Laterality: Left;   JOINT REPLACEMENT Right    TKR   KNEE ARTHROSCOPY Right 06/25/2014   Procedure: ARTHROSCOPY KNEE;  Surgeon: Marlynn Singer, MD;  Location: [facility] ORS;  Service: Orthopedics;  Laterality: Right;  partial arthroscopic medial menisectomy   LAPAROSCOPIC APPENDECTOMY N/A 06/02/2021   Procedure:  APPENDECTOMY LAPAROSCOPIC;  Surgeon: Flynn Hylan, MD;  Location: [facility] ORS;  Service: General;  Laterality: N/A;   TEE WITHOUT CARDIOVERSION N/A 04/17/2023   Procedure: ECHOCARDIOGRAM, TRANSESOPHAGEAL;  Surgeon: Constancia Delton, MD;  Location: [facility] ORS;  Service: Cardiovascular;  Laterality: N/A;   TOTAL KNEE ARTHROPLASTY Right 04/22/2015   Procedure: TOTAL KNEE ARTHROPLASTY;  Surgeon: Marlynn Singer, MD;  Location: [facility] ORS;  Service: Orthopedics;  Laterality: Right;   TOTAL KNEE ARTHROPLASTY Left 10/30/2017   Procedure: TOTAL KNEE ARTHROPLASTY;  Surgeon: Marlynn Singer, MD;  Location: [facility] ORS;  Service: Orthopedics;  Laterality: Left;   TOTAL KNEE REVISION Left 01/02/2018   Procedure: poly exchange of tibia and patella left knee;  Surgeon: Marlynn Singer, MD;  Location: [facility] ORS;  Service: Orthopedics;  Laterality: Left;   UMBILICAL HERNIA REPAIR  06/02/2021   Procedure: HERNIA REPAIR UMBILICAL ADULT;  Surgeon: Flynn Hylan, MD;  Location: [facility];  Service: General;;   Family History:  Family History  Problem Relation Age of Onset   CVA Mother        deceased at age 92   Depression Brother        Died by suicide at age 25   Family Psychiatric  History:  See H&P Social History:  Social History   Substance and Sexual Activity  Alcohol Use Not Currently   Comment: rare     Social History   Substance and Sexual Activity  Drug Use Not Currently   Types: Marijuana, Cocaine   Comment: last use January 2025, cocaine    Social History   Socioeconomic History   Marital status: Single    Spouse name: Not on file   Number of children: Not on file   Years of education: Not on file   Highest education level: Not on file  Occupational History   Not on file  Tobacco Use   Smoking status: Former    Current packs/day: 0.00    Average packs/day: 0.8 packs/day for 20.0 years (15.0 ttl pk-yrs)    Types: Cigarettes    Start date: 05/16/1964    Quit date: 05/16/1984    Years  since quitting: 39.1   Smokeless tobacco: Never  Vaping Use   Vaping status: Never Used  Substance and Sexual Activity   Alcohol use: Not Currently    Comment: rare   Drug use: Not Currently    Types: Marijuana, Cocaine    Comment: last use January 2025, cocaine   Sexual activity: Not Currently  Other Topics Concern   Not on file  Social History Narrative   ** Merged History Encounter **       Social Drivers of Health   Financial Resource Strain: Not on file  Food Insecurity: Food Insecurity Present (06/18/2023)   Hunger Vital Sign    Worried About Running Out of Food in the Last Year: Often true    Ran Out of Food in the Last Year: Often true  Transportation Needs: No Transportation Needs (06/18/2023)   PRAPARE - Administrator, Civil Service (Medical): No    Lack of Transportation (Non-Medical): No  Physical Activity: Not on file  Stress: Not on file  Social Connections: Socially Isolated (  02/15/2023)   Social Connection and Isolation Panel    Frequency of Communication with Friends and Family: Once a week    Frequency of Social Gatherings with Friends and Family: Once a week    Attends Religious Services: Never    Database administrator or Organizations: No    Attends Engineer, structural: Never    Marital Status: Never married     Current Medications: Current Facility-Administered Medications  Medication Dose Route Frequency Provider Last Rate Last Admin   acetaminophen  (TYLENOL ) tablet 650 mg  650 mg Oral Q6H PRN Mills, Shnese E, NP   650 mg at 06/22/23 1657   alum & mag hydroxide-simeth (MAALOX/MYLANTA) 200-200-20 MG/5ML suspension 30 mL  30 mL Oral Q4H PRN Mills, Shnese E, NP   30 mL at 06/27/23 1619   amiodarone  (PACERONE ) tablet 200 mg  200 mg Oral Daily Zouev, Dmitri, MD   200 mg at 06/27/23 0836   amLODipine  (NORVASC ) tablet 5 mg  5 mg Oral Daily Parker, Alvin S, MD   5 mg at 06/27/23 0836   ARIPiprazole (ABILIFY) tablet 5 mg  5 mg Oral QHS  Sybil Shrader A, MD   5 mg at 06/27/23 2111   atorvastatin  (LIPITOR) tablet 40 mg  40 mg Oral Daily Parker, Alvin S, MD   40 mg at 06/28/23 0730   haloperidol  (HALDOL ) tablet 5 mg  5 mg Oral TID PRN Doneen Fuelling, NP       And   diphenhydrAMINE  (BENADRYL ) capsule 50 mg  50 mg Oral TID PRN Mills, Shnese E, NP       haloperidol  lactate (HALDOL ) injection 5 mg  5 mg Intramuscular TID PRN Doneen Fuelling, NP       And   diphenhydrAMINE  (BENADRYL ) injection 50 mg  50 mg Intramuscular TID PRN Doneen Fuelling, NP       And   LORazepam  (ATIVAN ) injection 2 mg  2 mg Intramuscular TID PRN Mills, Shnese E, NP       haloperidol  lactate (HALDOL ) injection 10 mg  10 mg Intramuscular TID PRN Doneen Fuelling, NP       And   diphenhydrAMINE  (BENADRYL ) injection 50 mg  50 mg Intramuscular TID PRN [name], NP       And   LORazepam  (ATIVAN ) injection 2 mg  2 mg Intramuscular TID PRN Mills, Shnese E, NP       divalproex  (DEPAKOTE ) DR tablet 750 mg  750 mg Oral BID Mills, Shnese E, NP   750 mg at 06/28/23 0730   doxepin  (SINEQUAN ) capsule 75 mg  75 mg Oral QHS Reese Stockman A, MD   75 mg at 06/27/23 2111   FLUoxetine  (PROZAC ) capsule 20 mg  20 mg Oral Daily Shantaya Bluestone A, MD   20 mg at 06/28/23 0730   hydrOXYzine  (ATARAX ) tablet 25 mg  25 mg Oral TID PRN Mills, Shnese E, NP   25 mg at 06/27/23 2111   loperamide  (IMODIUM ) capsule 2 mg  2 mg Oral PRN Parker, Alvin S, MD   2 mg at 06/22/23 1656   magnesium  hydroxide (MILK OF MAGNESIA) suspension 30 mL  30 mL Oral Daily PRN Mills, Shnese E, NP       multivitamin with minerals tablet 1 tablet  1 tablet Oral Daily [name], NP   1 tablet at 06/28/23 0730   prazosin  (MINIPRESS ) capsule 4 mg  4 mg Oral QHS Jhonny Moss, MD  4 mg at 06/27/23 2232   topiramate  (TOPAMAX ) tablet 50 mg  50 mg Oral QHS Parker, Alvin S, MD   50 mg at 06/27/23 2111    Lab Results: No results found for this or any previous visit (from the past 48  hours).  Blood Alcohol level:  Lab Results  Component Value Date   [facility] <15 05/29/2023   ETH <10 04/12/2023    Metabolic Disorder Labs: Lab Results  Component Value Date   HGBA1C 4.9 06/01/2023   MPG 93.93 06/01/2023   MPG 111.15 12/29/2022   No results found for: PROLACTIN Lab Results  Component Value Date   CHOL 246 (H) 06/01/2023   TRIG 129 06/01/2023   HDL 37 (L) 06/01/2023   CHOLHDL 6.6 06/01/2023   VLDL 26 06/01/2023   LDLCALC 183 (H) 06/01/2023   LDLCALC 146 (H) 12/29/2022    Physical Findings: AIMS:  ,  ,  ,  ,  ,  ,   CIWA:    COWS:     Musculoskeletal: Strength & Muscle Tone: within normal limits Gait & Station: normal Patient leans: N/A  Psychiatric Specialty Exam:  Presentation  General Appearance:  Casually dressed, limited grooming, out in the day room prior to interview, not in any distress, bedtime engagement.  No EPS  Eye Contact: Better  Speech: Spontaneous.  Soft spoken.  Mood and Affect  Mood: Marginally less depressed.  Affect: Blunted and mood congruent.  Thought Process  Thought Processes: Linear and goal directed.  Descriptions of Associations:Intact  Orientation:Full (Time, Place and Person)  Thought Content: Fleeting suicidal thoughts no intent.  Feeling of hopelessness and worthlessness.  Negative and guilty ruminations.  No homicidal thoughts.  No thoughts of violence.  No delusional theme.  No obsessions.  Hallucinations: No hallucination in any modality.  Sensorium  Memory: Good.  Judgment: Good.  Insight: Good  Executive Functions  Concentration: Good.  Attention Span: Good.  Recall: Good.  Fund of Knowledge: Good.  Language: Good   Psychomotor Activity  Marginally better psychomotor activity.    Physical Exam: Physical Exam ROS Blood pressure 95/61, pulse 74, temperature 97.6 F (36.4 C), temperature source Oral, resp. rate 14, height 5' 9 (1.753 m), weight 82.6 kg, SpO2 98%.  Body mass index is 26.88 kg/m.   Treatment Plan Summary: Patient tolerated recent adjustments made.  He seems more energized today.  We will keep his medications the same and give it time to become fully effective.  We will evaluate him further.  1.  Fluoxetine  20 mg daily.  Will titrate as needed/tolerated. 2.  Aripiprazole to 5 mg daily. 3.  Depakote  750 mg twice daily. 4.  Prazosin  4 mg at bedtime. 5.  Decrease doxepin  to 75 mg at bedtime.  We will taper off in a couple of days. 6.  Continue to encourage unit groups and therapeutic activities. 7.  Continue to monitor mood behavior and interaction with others. 8.  Social worker will coordinate discharge and aftercare planning.   Amelie Jury, MD 06/28/2023, 3:01 PM

## 2023-06-28 NOTE — Progress Notes (Signed)
  [name] Colon Hol[place] Spano (CST team lead) [phone] was to call this writer back on Friday afternoon or Monday (6/16) morning regarding discharge plans.   This Clinical research associate attempted to contact [place] on 06/26/23, 06/27/23 and 06/28/23 at 0933. Voicemail box is full.   [place] reported she was working on discharge plans to keep pt in [place] after discharge. Awaiting call back at this time.   Signed:  Melo Stauber, LCSW-A 06/28/2023  9:33 AM

## 2023-06-28 NOTE — Progress Notes (Signed)
   06/28/23 2000  Psych Admission Type (Psych Patients Only)  Admission Status Voluntary  Psychosocial Assessment  Patient Complaints Anxiety;Depression  Eye Contact Fair  Facial Expression Sad  Affect Appropriate to circumstance  Speech Logical/coherent  Interaction Assertive  Motor Activity Slow  Appearance/Hygiene Unremarkable  Behavior Characteristics Cooperative  Mood Depressed;Anxious  Aggressive Behavior  Effect No apparent injury  Thought Process  Coherency WDL  Content WDL  Delusions WDL  Perception WDL  Hallucination None reported or observed  Judgment WDL  Confusion None  Danger to Self  Current suicidal ideation? Passive  Self-Injurious Behavior Some self-injurious ideation observed or expressed.  No lethal plan expressed   Agreement Not to Harm Self Yes  Description of Agreement verbal contract for safety  Danger to Others  Danger to Others None reported or observed

## 2023-06-28 NOTE — Progress Notes (Signed)
   06/28/23 6295  Psych Admission Type (Psych Patients Only)  Admission Status Voluntary  Psychosocial Assessment  Patient Complaints Anxiety;Depression  Eye Contact Fair  Facial Expression Sad  Affect Appropriate to circumstance  Speech Logical/coherent  Interaction Assertive  Motor Activity Slow  Appearance/Hygiene Unremarkable  Behavior Characteristics Appropriate to situation  Mood Anxious;Depressed  Thought Process  Coherency WDL  Content WDL  Delusions None reported or observed  Perception WDL  Hallucination None reported or observed  Judgment Limited  Confusion None  Danger to Self  Current suicidal ideation? Passive  Self-Injurious Behavior Self-injurious ideation verbalized  Agreement Not to Harm Self Yes  Description of Agreement Verbal  Danger to Others  Danger to Others None reported or observed

## 2023-06-29 NOTE — Progress Notes (Signed)
 [facility] MD Progress Note  06/29/2023 4:02 PM [name]  MRN:  [id number] Subjective:   63 year old Caucasian male, single, homeless, unemployed. Background history of MDD recurrent, anxiety disorder, alcohol use disorder in remission and multiple medical comorbidities.  Presented to the hospital by EMS.  Reported to have had seizures.  Reported history of worsening depression associated with thoughts of hanging himself.  Patient was recently stabilized and discharged from our unit.  He had not been adherent with his psychotropic medications.  Chart reviewed.  Patient discussed at multidisciplinary team meeting.  Nursing staff reports that he has been adherent with his medications.  He slept for 8.75 hours last night.  No PRNs required.  No challenging behavior.  He participated with some of the groups.  Social worker reports that he has been accepted into a program in [place].  His bed will be available on Tuesday at 1 PM.  Seen today.  Patient states that he is excited about the program.  States that he feels a lot better knowing he has something to go to.  He has been tolerating his medicines without any adverse effects.  Less negative ruminations.  No current intent to harm himself.  No manic features.  No psychotic features.  No evidence of activation. Encouraged to keep ventilating his feelings to staff.   Principal Problem: MDD (major depressive disorder), recurrent episode, severe (HCC) Diagnosis: Principal Problem:   MDD (major depressive disorder), recurrent episode, severe (HCC) Active Problems:   PTSD (post-traumatic stress disorder)   Nightmares associated with chronic post-traumatic stress disorder  Total Time spent with patient: 30 minutes  Past Psychiatric History:  See H&P  Past Medical History:  Past Medical History:  Diagnosis Date   Anxiety    Arthritis    knees and hands   Bipolar 1 disorder, depressed (HCC)    Bipolar disorder (HCC)    Depression     GERD (gastroesophageal reflux disease)    Hepatitis    HEP C   History of kidney stones    Hypertension    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 echo 5/17: EF 60-65%, no RWMA, LV dias fxn nl, LA mildly dilated, RV sys fxn nl, PASP nl, moderate sized circumferential pericardial effusion was identified, 2.12 cm around the LV free wall, <1 cm around the RV free wall. Features were not c/w tamponade physiology   PTSD (post-traumatic stress disorder)    Witnessed brother's suicide.   Restless leg syndrome    Seizures (HCC)    Syncope     Past Surgical History:  Procedure Laterality Date   APPENDECTOMY     CARDIOVERSION N/A 04/17/2023   Procedure: CARDIOVERSION;  Surgeon: Constancia Delton, MD;  Location: [facility] ORS;  Service: Cardiovascular;  Laterality: N/A;   CYSTOSCOPY WITH URETEROSCOPY AND STENT PLACEMENT     ESOPHAGOGASTRODUODENOSCOPY N/A 01/11/2016   Procedure: ESOPHAGOGASTRODUODENOSCOPY (EGD);  Surgeon: Baldo Bonds, MD;  Location: [facility] ENDOSCOPY;  Service: Endoscopy;  Laterality: N/A;   ESOPHAGOGASTRODUODENOSCOPY N/A 04/09/2020   Procedure: ESOPHAGOGASTRODUODENOSCOPY (EGD);  Surgeon: Luke Salaam, MD;  Location: [facility] ENDOSCOPY;  Service: Gastroenterology;  Laterality: N/A;   INCISION AND DRAINAGE ABSCESS Left 01/02/2018   Procedure: INCISION AND DRAINAGE LEFT KNEE;  Surgeon: Marlynn Singer, MD;  Location: [facility] ORS;  Service: Orthopedics;  Laterality: Left;   JOINT REPLACEMENT Right    TKR   KNEE ARTHROSCOPY Right 06/25/2014   Procedure: ARTHROSCOPY KNEE;  Surgeon:  Marlynn Singer, MD;  Location: [facility] ORS;  Service: Orthopedics;  Laterality: Right;  partial arthroscopic medial menisectomy   LAPAROSCOPIC APPENDECTOMY N/A 06/02/2021   Procedure: APPENDECTOMY LAPAROSCOPIC;  Surgeon: Flynn Hylan, MD;  Location: [facility] ORS;  Service: General;  Laterality: N/A;   TEE WITHOUT CARDIOVERSION N/A 04/17/2023   Procedure:  ECHOCARDIOGRAM, TRANSESOPHAGEAL;  Surgeon: Constancia Delton, MD;  Location: [facility] ORS;  Service: Cardiovascular;  Laterality: N/A;   TOTAL KNEE ARTHROPLASTY Right 04/22/2015   Procedure: TOTAL KNEE ARTHROPLASTY;  Surgeon: Marlynn Singer, MD;  Location: [facility] ORS;  Service: Orthopedics;  Laterality: Right;   TOTAL KNEE ARTHROPLASTY Left 10/30/2017   Procedure: TOTAL KNEE ARTHROPLASTY;  Surgeon: Marlynn Singer, MD;  Location: [facility] ORS;  Service: Orthopedics;  Laterality: Left;   TOTAL KNEE REVISION Left 01/02/2018   Procedure: poly exchange of tibia and patella left knee;  Surgeon: Marlynn Singer, MD;  Location: [facility] ORS;  Service: Orthopedics;  Laterality: Left;   UMBILICAL HERNIA REPAIR  06/02/2021   Procedure: HERNIA REPAIR UMBILICAL ADULT;  Surgeon: Flynn Hylan, MD;  Location: [facility] ORS;  Service: General;;   Family History:  Family History  Problem Relation Age of Onset   CVA Mother        deceased at age 64   Depression Brother        Died by suicide at age 25   Family Psychiatric  History:  See H&P Social History:  Social History   Substance and Sexual Activity  Alcohol Use Not Currently   Comment: rare     Social History   Substance and Sexual Activity  Drug Use Not Currently   Types: Marijuana, Cocaine   Comment: last use January 2025, cocaine    Social History   Socioeconomic History   Marital status: Single    Spouse name: Not on file   Number of children: Not on file   Years of education: Not on file   Highest education level: Not on file  Occupational History   Not on file  Tobacco Use   Smoking status: Former    Current packs/day: 0.00    Average packs/day: 0.8 packs/day for 20.0 years (15.0 ttl pk-yrs)    Types: Cigarettes    Start date: 05/16/1964    Quit date: 05/16/1984    Years since quitting: 39.1   Smokeless tobacco: Never  Vaping Use   Vaping status: Never Used  Substance and Sexual Activity   Alcohol use: Not Currently    Comment: rare    Drug use: Not Currently    Types: Marijuana, Cocaine    Comment: last use January 2025, cocaine   Sexual activity: Not Currently  Other Topics Concern   Not on file  Social History Narrative   ** Merged History Encounter **       Social Drivers of Health   Financial Resource Strain: Not on file  Food Insecurity: Food Insecurity Present (06/18/2023)   Hunger Vital Sign    Worried About Running Out of Food in the Last Year: Often true    Ran Out of Food in the Last Year: Often true  Transportation Needs: No Transportation Needs (06/18/2023)   PRAPARE - Administrator, Civil Service (Medical): No    Lack of Transportation (Non-Medical): No  Physical Activity: Not on file  Stress: Not on file  Social Connections: Socially Isolated (02/15/2023)   Social Connection and Isolation Panel    Frequency of Communication with Friends and Family: Once a week  Frequency of Social Gatherings with Friends and Family: Once a week    Attends Religious Services: Never    Database administrator or Organizations: No    Attends Engineer, structural: Never    Marital Status: Never married     Current Medications: Current Facility-Administered Medications  Medication Dose Route Frequency Provider Last Rate Last Admin   acetaminophen  (TYLENOL ) tablet 650 mg  650 mg Oral Q6H PRN Mills, Shnese E, NP   650 mg at 06/22/23 1657   alum & mag hydroxide-simeth (MAALOX/MYLANTA) 200-200-20 MG/5ML suspension 30 mL  30 mL Oral Q4H PRN Mills, Shnese E, NP   30 mL at 06/29/23 1553   amiodarone  (PACERONE ) tablet 200 mg  200 mg Oral Daily Zouev, Dmitri, MD   200 mg at 06/29/23 0741   amLODipine  (NORVASC ) tablet 5 mg  5 mg Oral Daily Parker, Alvin S, MD   5 mg at 06/29/23 0742   ARIPiprazole (ABILIFY) tablet 5 mg  5 mg Oral QHS Chisum Habenicht A, MD   5 mg at 06/28/23 2123   atorvastatin  (LIPITOR) tablet 40 mg  40 mg Oral Daily Parker, Alvin S, MD   40 mg at 06/29/23 5284   haloperidol  (HALDOL )  tablet 5 mg  5 mg Oral TID PRN Doneen Fuelling, NP       And   diphenhydrAMINE  (BENADRYL ) capsule 50 mg  50 mg Oral TID PRN Mills, Shnese E, NP       haloperidol  lactate (HALDOL ) injection 5 mg  5 mg Intramuscular TID PRN Doneen Fuelling, NP       And   diphenhydrAMINE  (BENADRYL ) injection 50 mg  50 mg Intramuscular TID PRN Doneen Fuelling, NP       And   LORazepam  (ATIVAN ) injection 2 mg  2 mg Intramuscular TID PRN Mills, Shnese E, NP       haloperidol  lactate (HALDOL ) injection 10 mg  10 mg Intramuscular TID PRN Doneen Fuelling, NP       And   diphenhydrAMINE  (BENADRYL ) injection 50 mg  50 mg Intramuscular TID PRN Doneen Fuelling, NP       And   LORazepam  (ATIVAN ) injection 2 mg  2 mg Intramuscular TID PRN Mills, Shnese E, NP       divalproex  (DEPAKOTE ) DR tablet 750 mg  750 mg Oral BID Mills, Shnese E, NP   750 mg at 06/29/23 1324   doxepin  (SINEQUAN ) capsule 75 mg  75 mg Oral QHS Hedi Barkan A, MD   75 mg at 06/28/23 2124   FLUoxetine  (PROZAC ) capsule 20 mg  20 mg Oral Daily Jazlin Tapscott, Iline Mallory, MD   20 mg at 06/29/23 4010   hydrOXYzine  (ATARAX ) tablet 25 mg  25 mg Oral TID PRN Mills, Shnese E, NP   25 mg at 06/28/23 2124   loperamide  (IMODIUM ) capsule 2 mg  2 mg Oral PRN Parker, Alvin S, MD   2 mg at 06/22/23 1656   magnesium  hydroxide (MILK OF MAGNESIA) suspension 30 mL  30 mL Oral Daily PRN Mills, Shnese E, NP       multivitamin with minerals tablet 1 tablet  1 tablet Oral Daily [name], NP   1 tablet at 06/29/23 2725   prazosin  (MINIPRESS ) capsule 4 mg  4 mg Oral QHS Jhonny Moss, MD   4 mg at 06/28/23 2123   topiramate  (TOPAMAX ) tablet 50 mg  50 mg Oral QHS Parker, Alvin S, MD   50  mg at 06/28/23 2124    Lab Results: No results found for this or any previous visit (from the past 48 hours).  Blood Alcohol level:  Lab Results  Component Value Date   [facility] <15 05/29/2023   ETH <10 04/12/2023    Metabolic Disorder Labs: Lab Results  Component Value Date    HGBA1C 4.9 06/01/2023   MPG 93.93 06/01/2023   MPG 111.15 12/29/2022   No results found for: PROLACTIN Lab Results  Component Value Date   CHOL 246 (H) 06/01/2023   TRIG 129 06/01/2023   HDL 37 (L) 06/01/2023   CHOLHDL 6.6 06/01/2023   VLDL 26 06/01/2023   LDLCALC 183 (H) 06/01/2023   LDLCALC 146 (H) 12/29/2022    Physical Findings: AIMS:  ,  ,  ,  ,  ,  ,   CIWA:    COWS:     Musculoskeletal: Strength & Muscle Tone: within normal limits Gait & Station: normal Patient leans: N/A  Psychiatric Specialty Exam:  Presentation  General Appearance:  Casually dressed, limited grooming, in bed this afternoon, not in any distress, good relatedness.  No EPS  Eye Contact: Good.  Speech: Spontaneous.  Normalizing rate, tone and volume.  Mood and Affect  Mood: Subjectively and objectively better.  Affect: Restricted and appropriate.  Thought Process  Thought Processes: Linear and goal directed.  Descriptions of Associations:Intact  Orientation:Full (Time, Place and Person)  Thought Content: Patient is more future oriented.  No current suicidal thoughts.  Less negative and guilty ruminations.  No homicidal thoughts.  No thoughts of violence.  No delusional theme.  No obsessions.  Hallucinations: No hallucination in any modality.  Sensorium  Memory: Good.  Judgment: Good.  Insight: Good  Executive Functions  Concentration: Good.  Attention Span: Good.  Recall: Good.  Fund of Knowledge: Good.  Language: Good   Psychomotor Activity  Marginally better psychomotor activity.    Physical Exam: Physical Exam ROS Blood pressure 95/69, pulse (!) 106, temperature 97.6 F (36.4 C), temperature source Oral, resp. rate 17, height 5' 9 (1.753 m), weight 82.6 kg, SpO2 96%. Body mass index is 26.88 kg/m.   Treatment Plan Summary: Patient has a program that has accepted him for Tuesday.  He is excited about this.  He has maintained progress made  with recent adjustments.  No imminent dangerousness.  We will maintain his current regimen and evaluate him further.  1.  Fluoxetine  20 mg daily.  Will titrate as needed/tolerated. 2.  Aripiprazole to 5 mg daily. 3.  Depakote  750 mg twice daily. 4.  Prazosin  4 mg at bedtime. 5.  Decrease doxepin  to 75 mg at bedtime.  We will taper off in a couple of days. 6.  Continue to encourage unit groups and therapeutic activities. 7.  Continue to monitor mood behavior and interaction with others. 8.  Social worker will coordinate discharge and aftercare planning.   Amelie Jury, MD 06/29/2023, 4:02 PM

## 2023-06-29 NOTE — Progress Notes (Signed)
[name] did not attend wrap up group.

## 2023-06-29 NOTE — Group Note (Signed)
 LCSW Group Therapy Note   Group Date: 06/29/2023 Start Time: 1100 End Time: 1200  Participation:  patient was present  Type of Therapy:  Group Therapy   Topic:  Healing From Within: Understanding Our Past, Building Our Future"  Objective:  To help participants understand the impact of early experiences on mental and physical health, with a focus on Adverse Childhood Experiences (ACEs), and to explore ways to build resilience and healing.  Group Goals: Understand ACEs and Their Impact: Learn how childhood experiences shape mental and physical health. Build Resilience: Develop strategies for overcoming challenges and creating positive change. Promote Healing: Recognize the value of support and the possibility of healing through therapy and self-care.  Summary: In today's session, we discussed how early experiences, especially ACEs, impact mental and physical health. We explored the effects of stress, abuse, and neglect on brain development and well-being. The group focused on resilience, understanding that healing and positive change are possible with support and self-awareness.  Therapeutic Modalities Used: Psychoeducation: Sharing information about ACEs and their effects. Cognitive Behavioral Therapy (CBT): Helping reframe negative thought patterns. Trauma-Informed Therapy: Creating a safe, supportive space for healing.   Zyair Rhein O Croy Drumwright, LCSWA 06/29/2023  7:38 PM

## 2023-06-29 NOTE — Plan of Care (Signed)
  Problem: Education: Goal: Emotional status will improve Outcome: Progressing   Problem: Safety: Goal: Periods of time without injury will increase Outcome: Progressing   

## 2023-06-29 NOTE — Progress Notes (Addendum)
  [name]   Type of Note: [facility] Treatment  Patient was accepted to [place] substance use treatment facility and will admit there on 07/04/23 at 1:00PM. CSW to arrange Safe transport to facility on day of discharge. Patient needs to arrive to treatment with a 30 day supply of medications in hand (no scripts).   MD aware. Pt aware and agreeable.  Signed:  Jmarion Christiano, LCSW-A 06/29/2023  10:55 AM

## 2023-06-29 NOTE — BHH Group Notes (Signed)
 Adult Psychoeducational Group Note  Date:  06/29/2023 Time:  1:03 PM  Group Topic/Focus:  Goals Group:   The focus of this group is to help patients establish daily goals to achieve during treatment and discuss how the patient can incorporate goal setting into their daily lives to aide in recovery.  Participation Level:  Did Not Attend  Participation Quality:  na  Affect:  na  Cognitive:  na  Insight: na  Engagement in Group:  na  Modes of Intervention:  na  Additional Comments:  Pt did not attend group  Antonyo Hinderer 06/29/2023, 1:03 PM

## 2023-06-30 MED ORDER — DOXEPIN HCL 25 MG PO CAPS
25.0000 mg | ORAL_CAPSULE | Freq: Every day | ORAL | Status: DC
  Administered 2023-06-30 – 2023-07-02 (×3): 25 mg via ORAL
  Filled 2023-06-30 (×3): qty 1

## 2023-06-30 MED ORDER — FLUOXETINE HCL 20 MG PO CAPS
40.0000 mg | ORAL_CAPSULE | Freq: Every day | ORAL | Status: DC
  Administered 2023-07-01 – 2023-07-03 (×3): 40 mg via ORAL
  Filled 2023-06-30 (×3): qty 2

## 2023-06-30 NOTE — Group Note (Unsigned)
 Date:  06/30/2023 Time:  9:22 AM  Group Topic/Focus:  Goals Group:   The focus of this group is to help patients establish daily goals to achieve during treatment and discuss how the patient can incorporate goal setting into their daily lives to aide in recovery.     Participation Level:  {BHH PARTICIPATION DGUYQ:03474}  Participation Quality:  {BHH PARTICIPATION QUALITY:22265}  Affect:  {BHH AFFECT:22266}  Cognitive:  {BHH COGNITIVE:22267}  Insight: {BHH Insight2:20797}  Engagement in Group:  {BHH ENGAGEMENT IN QVZDG:38756}  Modes of Intervention:  {BHH MODES OF INTERVENTION:22269}  Additional Comments:  ***  Tita Form 06/30/2023, 9:22 AM

## 2023-06-30 NOTE — Group Note (Signed)
 Date:  06/30/2023 Time:  12:23 PM  Group Topic/Focus:  Wellness Toolbox:   The focus of this group is to discuss various aspects of wellness, balancing those aspects and exploring ways to increase the ability to experience wellness.  Patients will create a wellness toolbox for use upon discharge.    Participation Level:  Active  Participation Quality:  Appropriate and Attentive  Affect:  Appropriate  Cognitive:  Alert and Appropriate  Insight: Appropriate and Good  Engagement in Group:  Engaged  Modes of Intervention:  Education  Additional Comments:    Tita Form 06/30/2023, 12:23 PM

## 2023-06-30 NOTE — Progress Notes (Signed)
   06/30/23 0040  Psych Admission Type (Psych Patients Only)  Admission Status Voluntary  Psychosocial Assessment  Patient Complaints Depression  Eye Contact Fair  Facial Expression Flat  Affect Appropriate to circumstance  Speech Logical/coherent  Interaction Assertive  Motor Activity Slow  Appearance/Hygiene Unremarkable  Behavior Characteristics Cooperative  Mood Depressed  Thought Process  Coherency WDL  Content WDL  Delusions None reported or observed  Perception WDL  Hallucination None reported or observed  Judgment WDL  Confusion None  Danger to Self  Current suicidal ideation? Passive  Self-Injurious Behavior Some self-injurious ideation observed or expressed.  No lethal plan expressed   Agreement Not to Harm Self Yes  Description of Agreement Verbal  Danger to Others  Danger to Others None reported or observed

## 2023-06-30 NOTE — Progress Notes (Signed)
 [name] did not attend AA wrap up group

## 2023-06-30 NOTE — Progress Notes (Signed)
 [facility] MD Progress Note  06/30/2023 2:59 PM Korrey Schleicher  MRN:  [id number] Subjective:   63 year old Caucasian male, single, homeless, unemployed. Background history of MDD recurrent, anxiety disorder, alcohol use disorder in remission and multiple medical comorbidities.  Presented to the hospital by EMS.  Reported to have had seizures.  Reported history of worsening depression associated with thoughts of hanging himself.  Patient was recently stabilized and discharged from our unit.  He had not been adherent with his psychotropic medications.  Chart reviewed.  Patient discussed at multidisciplinary team meeting.  Nursing staff reports that he slept for 9 hours last night.  He socializes a bit in the milieu but does not attend most of the groups.  No challenging behavior.  He has been adherent with his medicine.  Seen today.  Patient states that he is still thinking through the program [place] in [place].  States that he has some guilt about abandoning his girlfriend who is in a facility in [place].  Patient states that he is in the dilemma and has not made up his mind.  He is tolerating his medicines without any side effects.  No current suicidal thoughts.  No current homicidal thoughts.  No current thoughts of violence.  No psychotic symptoms.  No manic symptoms. Encouraged to keep taking his medications.  Principal Problem: MDD (major depressive disorder), recurrent episode, severe (HCC) Diagnosis: Principal Problem:   MDD (major depressive disorder), recurrent episode, severe (HCC) Active Problems:   PTSD (post-traumatic stress disorder)   Nightmares associated with chronic post-traumatic stress disorder  Total Time spent with patient: 30 minutes  Past Psychiatric History:  See H&P  Past Medical History:  Past Medical History:  Diagnosis Date   Anxiety    Arthritis    knees and hands   Bipolar 1 disorder, depressed (HCC)    Bipolar disorder (HCC)    Depression    GERD  (gastroesophageal reflux disease)    Hepatitis    HEP C   History of kidney stones    Hypertension    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 echo 5/17: EF 60-65%, no RWMA, LV dias fxn nl, LA mildly dilated, RV sys fxn nl, PASP nl, moderate sized circumferential pericardial effusion was identified, 2.12 cm around the LV free wall, <1 cm around the RV free wall. Features were not c/w tamponade physiology   PTSD (post-traumatic stress disorder)    Witnessed brother's suicide.   Restless leg syndrome    Seizures (HCC)    Syncope     Past Surgical History:  Procedure Laterality Date   APPENDECTOMY     CARDIOVERSION N/A 04/17/2023   Procedure: CARDIOVERSION;  Surgeon: Constancia Delton, MD;  Location: [facility] ORS;  Service: Cardiovascular;  Laterality: N/A;   CYSTOSCOPY WITH URETEROSCOPY AND STENT PLACEMENT     ESOPHAGOGASTRODUODENOSCOPY N/A 01/11/2016   Procedure: ESOPHAGOGASTRODUODENOSCOPY (EGD);  Surgeon: Baldo Bonds, MD;  Location: [facility] ENDOSCOPY;  Service: Endoscopy;  Laterality: N/A;   ESOPHAGOGASTRODUODENOSCOPY N/A 04/09/2020   Procedure: ESOPHAGOGASTRODUODENOSCOPY (EGD);  Surgeon: Luke Salaam, MD;  Location: [facility] ENDOSCOPY;  Service: Gastroenterology;  Laterality: N/A;   INCISION AND DRAINAGE ABSCESS Left 01/02/2018   Procedure: INCISION AND DRAINAGE LEFT KNEE;  Surgeon: Marlynn Singer, MD;  Location: [facility] ORS;  Service: Orthopedics;  Laterality: Left;   JOINT REPLACEMENT Right    TKR   KNEE ARTHROSCOPY Right 06/25/2014   Procedure: ARTHROSCOPY KNEE;  Surgeon: Marlynn Singer,  MD;  Location: [facility] ORS;  Service: Orthopedics;  Laterality: Right;  partial arthroscopic medial menisectomy   LAPAROSCOPIC APPENDECTOMY N/A 06/02/2021   Procedure: APPENDECTOMY LAPAROSCOPIC;  Surgeon: Flynn Hylan, MD;  Location: [facility] ORS;  Service: General;  Laterality: N/A;   TEE WITHOUT CARDIOVERSION N/A 04/17/2023   Procedure:  ECHOCARDIOGRAM, TRANSESOPHAGEAL;  Surgeon: Constancia Delton, MD;  Location: [facility] ORS;  Service: Cardiovascular;  Laterality: N/A;   TOTAL KNEE ARTHROPLASTY Right 04/22/2015   Procedure: TOTAL KNEE ARTHROPLASTY;  Surgeon: Marlynn Singer, MD;  Location: [facility] ORS;  Service: Orthopedics;  Laterality: Right;   TOTAL KNEE ARTHROPLASTY Left 10/30/2017   Procedure: TOTAL KNEE ARTHROPLASTY;  Surgeon: Marlynn Singer, MD;  Location: [facility] ORS;  Service: Orthopedics;  Laterality: Left;   TOTAL KNEE REVISION Left 01/02/2018   Procedure: poly exchange of tibia and patella left knee;  Surgeon: Marlynn Singer, MD;  Location: [facility] ORS;  Service: Orthopedics;  Laterality: Left;   UMBILICAL HERNIA REPAIR  06/02/2021   Procedure: HERNIA REPAIR UMBILICAL ADULT;  Surgeon: Flynn Hylan, MD;  Location: [facility] ORS;  Service: General;;   Family History:  Family History  Problem Relation Age of Onset   CVA Mother        deceased at age 57   Depression Brother        Died by suicide at age 44   Family Psychiatric  History:  See H&P Social History:  Social History   Substance and Sexual Activity  Alcohol Use Not Currently   Comment: rare     Social History   Substance and Sexual Activity  Drug Use Not Currently   Types: Marijuana, Cocaine   Comment: last use January 2025, cocaine    Social History   Socioeconomic History   Marital status: Single    Spouse name: Not on file   Number of children: Not on file   Years of education: Not on file   Highest education level: Not on file  Occupational History   Not on file  Tobacco Use   Smoking status: Former    Current packs/day: 0.00    Average packs/day: 0.8 packs/day for 20.0 years (15.0 ttl pk-yrs)    Types: Cigarettes    Start date: 05/16/1964    Quit date: 05/16/1984    Years since quitting: 39.1   Smokeless tobacco: Never  Vaping Use   Vaping status: Never Used  Substance and Sexual Activity   Alcohol use: Not Currently    Comment: rare    Drug use: Not Currently    Types: Marijuana, Cocaine    Comment: last use January 2025, cocaine   Sexual activity: Not Currently  Other Topics Concern   Not on file  Social History Narrative   ** Merged History Encounter **       Social Drivers of Health   Financial Resource Strain: Not on file  Food Insecurity: Food Insecurity Present (06/18/2023)   Hunger Vital Sign    Worried About Running Out of Food in the Last Year: Often true    Ran Out of Food in the Last Year: Often true  Transportation Needs: No Transportation Needs (06/18/2023)   PRAPARE - Administrator, Civil Service (Medical): No    Lack of Transportation (Non-Medical): No  Physical Activity: Not on file  Stress: Not on file  Social Connections: Socially Isolated (02/15/2023)   Social Connection and Isolation Panel    Frequency of Communication with Friends and Family: Once a week    Frequency of  Social Gatherings with Friends and Family: Once a week    Attends Religious Services: Never    Database administrator or Organizations: No    Attends Engineer, structural: Never    Marital Status: Never married     Current Medications: Current Facility-Administered Medications  Medication Dose Route Frequency Provider Last Rate Last Admin   acetaminophen  (TYLENOL ) tablet 650 mg  650 mg Oral Q6H PRN Mills, Shnese E, NP   650 mg at 06/22/23 1657   alum & mag hydroxide-simeth (MAALOX/MYLANTA) 200-200-20 MG/5ML suspension 30 mL  30 mL Oral Q4H PRN Mills, Shnese E, NP   30 mL at 06/29/23 1553   amiodarone  (PACERONE ) tablet 200 mg  200 mg Oral Daily Zouev, Dmitri, MD   200 mg at 06/30/23 1610   amLODipine  (NORVASC ) tablet 5 mg  5 mg Oral Daily Parker, Alvin S, MD   5 mg at 06/30/23 0808   ARIPiprazole (ABILIFY) tablet 5 mg  5 mg Oral QHS Ahmon Tosi A, MD   5 mg at 06/29/23 2131   atorvastatin  (LIPITOR) tablet 40 mg  40 mg Oral Daily Parker, Alvin S, MD   40 mg at 06/30/23 9604   haloperidol  (HALDOL )  tablet 5 mg  5 mg Oral TID PRN Doneen Fuelling, NP       And   diphenhydrAMINE  (BENADRYL ) capsule 50 mg  50 mg Oral TID PRN Mills, Shnese E, NP       haloperidol  lactate (HALDOL ) injection 5 mg  5 mg Intramuscular TID PRN Doneen Fuelling, NP       And   diphenhydrAMINE  (BENADRYL ) injection 50 mg  50 mg Intramuscular TID PRN Doneen Fuelling, NP       And   LORazepam  (ATIVAN ) injection 2 mg  2 mg Intramuscular TID PRN Mills, Shnese E, NP       haloperidol  lactate (HALDOL ) injection 10 mg  10 mg Intramuscular TID PRN Doneen Fuelling, NP       And   diphenhydrAMINE  (BENADRYL ) injection 50 mg  50 mg Intramuscular TID PRN [name], NP       And   LORazepam  (ATIVAN ) injection 2 mg  2 mg Intramuscular TID PRN Mills, Shnese E, NP       divalproex  (DEPAKOTE ) DR tablet 750 mg  750 mg Oral BID Mills, Shnese E, NP   750 mg at 06/30/23 5409   doxepin  (SINEQUAN ) capsule 75 mg  75 mg Oral QHS Gurleen Larrivee A, MD   75 mg at 06/29/23 2104   FLUoxetine  (PROZAC ) capsule 20 mg  20 mg Oral Daily Jasiya Markie, Iline Mallory, MD   20 mg at 06/30/23 8119   hydrOXYzine  (ATARAX ) tablet 25 mg  25 mg Oral TID PRN Mills, Shnese E, NP   25 mg at 06/29/23 2105   loperamide  (IMODIUM ) capsule 2 mg  2 mg Oral PRN Parker, Alvin S, MD   2 mg at 06/22/23 1656   magnesium  hydroxide (MILK OF MAGNESIA) suspension 30 mL  30 mL Oral Daily PRN Mills, Shnese E, NP       multivitamin with minerals tablet 1 tablet  1 tablet Oral Daily Mills, Shnese E, NP   1 tablet at 06/30/23 0808   prazosin  (MINIPRESS ) capsule 4 mg  4 mg Oral QHS Jhonny Moss, MD   4 mg at 06/29/23 2102   topiramate  (TOPAMAX ) tablet 50 mg  50 mg Oral QHS Parker, Alvin S, MD   50 mg at  06/29/23 2103    Lab Results: No results found for this or any previous visit (from the past 48 hours).  Blood Alcohol level:  Lab Results  Component Value Date   [facility] <15 05/29/2023   ETH <10 04/12/2023    Metabolic Disorder Labs: Lab Results  Component Value Date    HGBA1C 4.9 06/01/2023   MPG 93.93 06/01/2023   MPG 111.15 12/29/2022   No results found for: PROLACTIN Lab Results  Component Value Date   CHOL 246 (H) 06/01/2023   TRIG 129 06/01/2023   HDL 37 (L) 06/01/2023   CHOLHDL 6.6 06/01/2023   VLDL 26 06/01/2023   LDLCALC 183 (H) 06/01/2023   LDLCALC 146 (H) 12/29/2022    Physical Findings: AIMS:  ,  ,  ,  ,  ,  ,   CIWA:    COWS:     Musculoskeletal: Strength & Muscle Tone: within normal limits Gait & Station: normal Patient leans: N/A  Psychiatric Specialty Exam:  Presentation  General Appearance:  Casually dressed, limited grooming, not in any distress, good relatedness.  No EPS  Eye Contact: Good.  Speech: Spontaneous.  Normal rate, tone and volume.  Mood and Affect  Mood: Worried but not pervasively down.  Affect: Restricted and appropriate.  Thought Process  Thought Processes: Linear and goal directed.  Descriptions of Associations:Intact  Orientation:Full (Time, Place and Person)  Thought Content: Future oriented.  No current suicidal thoughts.  Less negative and guilty ruminations.  No homicidal thoughts.  No thoughts of violence.  No delusional theme.  No obsessions.  Hallucinations: No hallucination in any modality.  Sensorium  Memory: Good.  Judgment: Good.  Insight: Good  Executive Functions  Concentration: Good.  Attention Span: Good.  Recall: Good.  Fund of Knowledge: Good.  Language: Good   Psychomotor Activity  Marginally better psychomotor activity.    Physical Exam: Physical Exam ROS Blood pressure 94/67, pulse 77, temperature (!) 97.4 F (36.3 C), temperature source Oral, resp. rate 18, height 5' 9 (1.753 m), weight 82.6 kg, SpO2 99%. Body mass index is 26.88 kg/m.   Treatment Plan Summary: Patient has ambivalent feelings about the program today.  He is still trying to make up his mind.  His mood has remained relatively stable.  No dangerousness.  We  will optimize fluoxetine  as below and evaluate him further.  1.  Increase Fluoxetine  to 40 mg daily.  Will titrate as needed/tolerated. 2.  Aripiprazole to 5 mg daily. 3.  Depakote  750 mg twice daily. 4.  Prazosin  4 mg at bedtime. 5.  Decrease doxepin  to 25 mg at bedtime.  6.  Continue to encourage unit groups and therapeutic activities. 7.  Continue to monitor mood behavior and interaction with others. 8.  Social worker will coordinate discharge and aftercare planning.   Amelie Jury, MD 06/30/2023, 2:59 PM

## 2023-06-30 NOTE — Group Note (Signed)
 Date:  06/30/2023 Time:  11:45 AM  Group Topic/Focus:  Goals Group:   The focus of this group is to help patients establish daily goals to achieve during treatment and discuss how the patient can incorporate goal setting into their daily lives to aide in recovery.    Participation Level:  Did Not Attend  Participation Quality:    Affect:    Cognitive:    Insight:   Engagement in Group:    Modes of Intervention:    Additional Comments:  Pt were aware of group time. Pt refused to attend group.  Tita Form 06/30/2023, 11:45 AM

## 2023-06-30 NOTE — Plan of Care (Signed)
  Problem: Education: Goal: Emotional status will improve Outcome: Progressing Goal: Mental status will improve Outcome: Progressing Goal: Verbalization of understanding the information provided will improve Outcome: Progressing   Problem: Safety: Goal: Periods of time without injury will increase Outcome: Progressing   

## 2023-06-30 NOTE — Plan of Care (Signed)
   Problem: Health Behavior/Discharge Planning: Goal: Compliance with treatment plan for underlying cause of condition will improve Outcome: Progressing   Problem: Safety: Goal: Periods of time without injury will increase Outcome: Progressing

## 2023-06-30 NOTE — Group Note (Signed)
 Recreation Therapy Group Note   Group Topic:Team Building  Group Date: 06/30/2023 Start Time: 0935 End Time: 1005 Facilitators: Marchell Froman-McCall, LRT,CTRS Location: [address] Dayroom   Group Topic: Communication, Team Building, Problem Solving  Goal Area(s) Addresses:  Patient will effectively work with peer towards shared goal.  Patient will identify skills used to make activity successful.  Patient will identify how skills used during activity can be applied to reach post d/c goals.   Behavioral Response:   Intervention: STEM Activity- Glass blower/designer  Activity: Tallest [employer]. In teams of 5-6, patients were given 11 craft pipe cleaners. Using the materials provided, patients were instructed to compete again the opposing team(s) to build the tallest free-standing structure from floor level. The activity was timed; difficulty increased by Clinical research associate as Production designer, theatre/television/film continued.  Systematically resources were removed with additional directions for example, placing one arm behind their back, working in silence, and shape stipulations. LRT facilitated post-activity discussion reviewing team processes and necessary communication skills involved in completion. Patients were encouraged to reflect how the skills utilized, or not utilized, in this activity can be incorporated to positively impact support systems post discharge.  Education: Pharmacist, community, Scientist, physiological, Discharge Planning   Education Outcome: Acknowledges education/In group clarification offered/Needs additional education.    Affect/Mood: N/A   Participation Level: Did not attend    Clinical Observations/Individualized Feedback:     Plan: Continue to engage patient in RT group sessions 2-3x/week.   Lorri Fukuhara-McCall, LRT,CTRS 06/30/2023 12:57 PM

## 2023-06-30 NOTE — Progress Notes (Signed)
 D: Pt alert and oriented. Pt rates depression 8/10 and anxiety 8/10.  Pt denies experiencing any SI/HI, or AVH at this time.Pt stated his last bm was today. On a scale of 0-10 pt rated his pain a 0.Respirations are even and unlabored.   A: Scheduled medications administered to pt, per MD orders. Support and encouragement provided. Frequent verbal contact made. Routine safety checks conducted q15 minutes.   R: No adverse drug reactions noted. Pt verbally contracts for safety at this time. Pt complaint with medications and treatment plan. Pt interacts well with others on the unit. Pt remains safe at this time. Will continue to monitor.

## 2023-07-01 NOTE — Progress Notes (Signed)
 Adult Psychoeducational Group Note  Date:  07/01/2023 Time:  10:11 AM  Group Topic/Focus:  Goals Group:   The focus of this group is to help patients establish daily goals to achieve during treatment and discuss how the patient can incorporate goal setting into their daily lives to aide in recovery.  Participation Level:  Active  Participation Quality:  Appropriate  Affect:  Appropriate  Cognitive:  Appropriate  Insight: Appropriate  Engagement in Group:  Engaged  Modes of Intervention:  Discussion  Additional Comments:  Pt stated he is feeling well.  Pt goal for the day is to talk with Child psychotherapist.  Daine Pillar D 07/01/2023, 10:11 AM

## 2023-07-01 NOTE — Group Note (Unsigned)
 Date:  07/01/2023 Time:  10:33 PM  Group Topic/Focus:  Wrap-Up Group:   The focus of this group is to help patients review their daily goal of treatment and discuss progress on daily workbooks.     Participation Level:  {BHH PARTICIPATION OZCZO:77735}  Participation Quality:  {BHH PARTICIPATION QUALITY:22265}  Affect:  {BHH AFFECT:22266}  Cognitive:  {BHH COGNITIVE:22267}  Insight: {BHH Insight2:20797}  Engagement in Group:  {BHH ENGAGEMENT IN HMNLE:77731}  Modes of Intervention:  {BHH MODES OF INTERVENTION:22269}  Additional Comments:  ***  [name] 07/01/2023, 10:33 PM

## 2023-07-01 NOTE — Progress Notes (Signed)
 [facility] MD Progress Note  07/01/2023 1:40 PM [name]  MRN:  [id number] Subjective:   63 year old Caucasian male, single, homeless, unemployed. Background history of MDD recurrent, anxiety disorder, alcohol use disorder in remission and multiple medical comorbidities.  Presented to the hospital by EMS.  Reported to have had seizures.  Reported history of worsening depression associated with thoughts of hanging himself.  Patient was recently stabilized and discharged from our unit.  He had not been adherent with his psychotropic medications.  Chart reviewed.  Patient discussed at multidisciplinary team meeting.  Nursing staff reports that he slept for 4 hours last night.  He has been adherent with his medication.  He took as needed hydroxyzine .  He has been up and about on the unit today.  He participated with groups this morning.  No challenging behavior.  Seen today.  Patient states that he has made up his mind to go to the program in [place].  Acknowledged poor sleep last night.  He was seen in his room most of the day yesterday.  He has been staying up today.  He has tolerated recent increase in dose of fluoxetine .  He is not endorsing any side effects.  Encouraged to stay up during the day so that he will be tired and sleep at night.  No manic features.  No psychotic features.  No rageful thoughts towards self or towards others.   Principal Problem: MDD (major depressive disorder), recurrent episode, severe (HCC) Diagnosis: Principal Problem:   MDD (major depressive disorder), recurrent episode, severe (HCC) Active Problems:   PTSD (post-traumatic stress disorder)   Nightmares associated with chronic post-traumatic stress disorder  Total Time spent with patient: 30 minutes  Past Psychiatric History:  See H&P  Past Medical History:  Past Medical History:  Diagnosis Date   Anxiety    Arthritis    knees and hands   Bipolar 1 disorder, depressed (HCC)    Bipolar disorder  (HCC)    Depression    GERD (gastroesophageal reflux disease)    Hepatitis    HEP C   History of kidney stones    Hypertension    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 echo 5/17: EF 60-65%, no RWMA, LV dias fxn nl, LA mildly dilated, RV sys fxn nl, PASP nl, moderate sized circumferential pericardial effusion was identified, 2.12 cm around the LV free wall, <1 cm around the RV free wall. Features were not c/w tamponade physiology   PTSD (post-traumatic stress disorder)    Witnessed brother's suicide.   Restless leg syndrome    Seizures (HCC)    Syncope     Past Surgical History:  Procedure Laterality Date   APPENDECTOMY     CARDIOVERSION N/A 04/17/2023   Procedure: CARDIOVERSION;  Surgeon: Darliss Rogue, MD;  Location: [facility] ORS;  Service: Cardiovascular;  Laterality: N/A;   CYSTOSCOPY WITH URETEROSCOPY AND STENT PLACEMENT     ESOPHAGOGASTRODUODENOSCOPY N/A 01/11/2016   Procedure: ESOPHAGOGASTRODUODENOSCOPY (EGD);  Surgeon: Jerrell Sol, MD;  Location: [facility] ENDOSCOPY;  Service: Endoscopy;  Laterality: N/A;   ESOPHAGOGASTRODUODENOSCOPY N/A 04/09/2020   Procedure: ESOPHAGOGASTRODUODENOSCOPY (EGD);  Surgeon: Therisa Bi, MD;  Location: [facility] ENDOSCOPY;  Service: Gastroenterology;  Laterality: N/A;   INCISION AND DRAINAGE ABSCESS Left 01/02/2018   Procedure: INCISION AND DRAINAGE LEFT KNEE;  Surgeon: Cleotilde Barrio, MD;  Location: [facility] ORS;  Service: Orthopedics;  Laterality: Left;   JOINT REPLACEMENT Right  TKR   KNEE ARTHROSCOPY Right 06/25/2014   Procedure: ARTHROSCOPY KNEE;  Surgeon: Kayla Pinal, MD;  Location: [facility] ORS;  Service: Orthopedics;  Laterality: Right;  partial arthroscopic medial menisectomy   LAPAROSCOPIC APPENDECTOMY N/A 06/02/2021   Procedure: APPENDECTOMY LAPAROSCOPIC;  Surgeon: Lane Shope, MD;  Location: [facility] ORS;  Service: General;  Laterality: N/A;   TEE WITHOUT CARDIOVERSION N/A  04/17/2023   Procedure: ECHOCARDIOGRAM, TRANSESOPHAGEAL;  Surgeon: Darliss Rogue, MD;  Location: [facility] ORS;  Service: Cardiovascular;  Laterality: N/A;   TOTAL KNEE ARTHROPLASTY Right 04/22/2015   Procedure: TOTAL KNEE ARTHROPLASTY;  Surgeon: Kayla Pinal, MD;  Location: [facility] ORS;  Service: Orthopedics;  Laterality: Right;   TOTAL KNEE ARTHROPLASTY Left 10/30/2017   Procedure: TOTAL KNEE ARTHROPLASTY;  Surgeon: Pinal Kayla, MD;  Location: [facility] ORS;  Service: Orthopedics;  Laterality: Left;   TOTAL KNEE REVISION Left 01/02/2018   Procedure: poly exchange of tibia and patella left knee;  Surgeon: Pinal Kayla, MD;  Location: [facility] ORS;  Service: Orthopedics;  Laterality: Left;   UMBILICAL HERNIA REPAIR  06/02/2021   Procedure: HERNIA REPAIR UMBILICAL ADULT;  Surgeon: Lane Shope, MD;  Location: [facility] ORS;  Service: General;;   Family History:  Family History  Problem Relation Age of Onset   CVA Mother        deceased at age 43   Depression Brother        Died by suicide at age 28   Family Psychiatric  History:  See H&P Social History:  Social History   Substance and Sexual Activity  Alcohol Use Not Currently   Comment: rare     Social History   Substance and Sexual Activity  Drug Use Not Currently   Types: Marijuana, Cocaine   Comment: last use January 2025, cocaine    Social History   Socioeconomic History   Marital status: Single    Spouse name: Not on file   Number of children: Not on file   Years of education: Not on file   Highest education level: Not on file  Occupational History   Not on file  Tobacco Use   Smoking status: Former    Current packs/day: 0.00    Average packs/day: 0.8 packs/day for 20.0 years (15.0 ttl pk-yrs)    Types: Cigarettes    Start date: 05/16/1964    Quit date: 05/16/1984    Years since quitting: 39.1   Smokeless tobacco: Never  Vaping Use   Vaping status: Never Used  Substance and Sexual Activity   Alcohol use: Not Currently     Comment: rare   Drug use: Not Currently    Types: Marijuana, Cocaine    Comment: last use January 2025, cocaine   Sexual activity: Not Currently  Other Topics Concern   Not on file  Social History Narrative   ** Merged History Encounter **       Social Drivers of Health   Financial Resource Strain: Not on file  Food Insecurity: Food Insecurity Present (06/18/2023)   Hunger Vital Sign    Worried About Running Out of Food in the Last Year: Often true    Ran Out of Food in the Last Year: Often true  Transportation Needs: No Transportation Needs (06/18/2023)   PRAPARE - Administrator, Civil Service (Medical): No    Lack of Transportation (Non-Medical): No  Physical Activity: Not on file  Stress: Not on file  Social Connections: Socially Isolated (02/15/2023)   Social Connection and Isolation Panel  Frequency of Communication with Friends and Family: Once a week    Frequency of Social Gatherings with Friends and Family: Once a week    Attends Religious Services: Never    Database administrator or Organizations: No    Attends Engineer, structural: Never    Marital Status: Never married     Current Medications: Current Facility-Administered Medications  Medication Dose Route Frequency Provider Last Rate Last Admin   acetaminophen  (TYLENOL ) tablet 650 mg  650 mg Oral Q6H PRN Mills, Shnese E, NP   650 mg at 06/30/23 1805   alum & mag hydroxide-simeth (MAALOX/MYLANTA) 200-200-20 MG/5ML suspension 30 mL  30 mL Oral Q4H PRN Mills, Shnese E, NP   30 mL at 06/30/23 2256   amiodarone  (PACERONE ) tablet 200 mg  200 mg Oral Daily Zouev, Dmitri, MD   200 mg at 07/01/23 0814   amLODipine  (NORVASC ) tablet 5 mg  5 mg Oral Daily Parker, Alvin S, MD   5 mg at 06/30/23 9191   ARIPiprazole  (ABILIFY ) tablet 5 mg  5 mg Oral QHS Trigg Delarocha, Jerrell LABOR, MD   5 mg at 06/30/23 2110   atorvastatin  (LIPITOR) tablet 40 mg  40 mg Oral Daily Parker, Alvin S, MD   40 mg at 07/01/23 0814    haloperidol  (HALDOL ) tablet 5 mg  5 mg Oral TID PRN Moishe Bernadette BRAVO, NP       And   diphenhydrAMINE  (BENADRYL ) capsule 50 mg  50 mg Oral TID PRN Mills, Shnese E, NP       haloperidol  lactate (HALDOL ) injection 5 mg  5 mg Intramuscular TID PRN Moishe Bernadette BRAVO, NP       And   diphenhydrAMINE  (BENADRYL ) injection 50 mg  50 mg Intramuscular TID PRN Moishe Bernadette BRAVO, NP       And   LORazepam  (ATIVAN ) injection 2 mg  2 mg Intramuscular TID PRN Mills, Shnese E, NP       haloperidol  lactate (HALDOL ) injection 10 mg  10 mg Intramuscular TID PRN Moishe Bernadette BRAVO, NP       And   diphenhydrAMINE  (BENADRYL ) injection 50 mg  50 mg Intramuscular TID PRN Moishe Bernadette E, NP       And   LORazepam  (ATIVAN ) injection 2 mg  2 mg Intramuscular TID PRN Mills, Shnese E, NP       divalproex  (DEPAKOTE ) DR tablet 750 mg  750 mg Oral BID Mills, Shnese E, NP   750 mg at 07/01/23 9185   doxepin  (SINEQUAN ) capsule 25 mg  25 mg Oral QHS Svea Pusch, Jerrell LABOR, MD   25 mg at 06/30/23 2111   FLUoxetine  (PROZAC ) capsule 40 mg  40 mg Oral Daily Keren Alverio, Jerrell LABOR, MD   40 mg at 07/01/23 9185   hydrOXYzine  (ATARAX ) tablet 25 mg  25 mg Oral TID PRN Mills, Shnese E, NP   25 mg at 07/01/23 0329   loperamide  (IMODIUM ) capsule 2 mg  2 mg Oral PRN Parker, Alvin S, MD   2 mg at 06/22/23 1656   magnesium  hydroxide (MILK OF MAGNESIA) suspension 30 mL  30 mL Oral Daily PRN Mills, Shnese E, NP       multivitamin with minerals tablet 1 tablet  1 tablet Oral Daily Moishe Bernadette E, NP   1 tablet at 07/01/23 9185   prazosin  (MINIPRESS ) capsule 4 mg  4 mg Oral QHS Prentis Oliva LABOR, MD   4 mg at 06/30/23 2110   topiramate  (TOPAMAX ) tablet 50  mg  50 mg Oral QHS Parker, Alvin S, MD   50 mg at 06/30/23 2110    Lab Results: No results found for this or any previous visit (from the past 48 hours).  Blood Alcohol level:  Lab Results  Component Value Date   [facility] <15 05/29/2023   ETH <10 04/12/2023    Metabolic Disorder Labs: Lab Results   Component Value Date   HGBA1C 4.9 06/01/2023   MPG 93.93 06/01/2023   MPG 111.15 12/29/2022   No results found for: PROLACTIN Lab Results  Component Value Date   CHOL 246 (H) 06/01/2023   TRIG 129 06/01/2023   HDL 37 (L) 06/01/2023   CHOLHDL 6.6 06/01/2023   VLDL 26 06/01/2023   LDLCALC 183 (H) 06/01/2023   LDLCALC 146 (H) 12/29/2022    Physical Findings: AIMS:  ,  ,  ,  ,  ,  ,   CIWA:    COWS:     Musculoskeletal: Strength & Muscle Tone: within normal limits Gait & Station: normal Patient leans: N/A  Psychiatric Specialty Exam:  Presentation  General Appearance:  Casually dressed, in the day room interacting with peers, not in any distress.  No EPS  Eye Contact: Good.  Speech: Spontaneous.  Normal rate, tone and volume.  Mood and Affect  Mood: Worried but not pervasively down.  Affect: Restricted and appropriate.  Thought Process  Thought Processes: Linear and goal directed.  Descriptions of Associations:Intact  Orientation:Full (Time, Place and Person)  Thought Content: Future oriented.  No current suicidal thoughts.  Less negative and guilty ruminations.  No homicidal thoughts.  No thoughts of violence.  No delusional theme.  No obsessions.  Hallucinations: No hallucination in any modality.  Sensorium  Memory: Good.  Judgment: Good.  Insight: Good  Executive Functions  Concentration: Good.  Attention Span: Good.  Recall: Good.  Fund of Knowledge: Good.  Language: Good   Psychomotor Activity  Marginally better psychomotor activity.    Physical Exam: Physical Exam ROS Blood pressure (!) 89/62, pulse 84, temperature (!) 97.5 F (36.4 C), temperature source Oral, resp. rate 17, height 5' 9 (1.753 m), weight 82.6 kg, SpO2 99%. Body mass index is 26.88 kg/m.   Treatment Plan Summary: Patient has tolerated recent medication adjustment well.  He seems more energetic today.  No evidence of activation.  No evidence of  mania.  No evidence of psychosis.  We will keep his medications at the same dose and give it time to become fully effective.  1.  Fluoxetine  to 40 mg daily.  Will titrate as needed/tolerated. 2.  Aripiprazole  to 5 mg daily. 3.  Depakote  750 mg twice daily. 4.  Prazosin  4 mg at bedtime. 5.  Decrease doxepin  to 25 mg at bedtime.  6.  Continue to encourage unit groups and therapeutic activities. 7.  Continue to monitor mood behavior and interaction with others. 8.  Social worker will coordinate discharge and aftercare planning.   Jerrell DELENA Forehand, MD 07/01/2023, 1:40 PM

## 2023-07-01 NOTE — Progress Notes (Signed)
 D: Pt alert and oriented. Pt rates depression 0/10 and anxiety 8/10.  Pt denies experiencing any SI/HI, or AVH at this time.   A: Scheduled medications administered to pt, per MD orders. Support and encouragement provided. Frequent verbal contact made. Routine safety checks conducted q15 minutes.   R: No adverse drug reactions noted. Pt verbally contracts for safety at this time. Pt complaint with medications and treatment plan. Pt interacts appropriately with peers in the milieu. Pt remains safe at this time.Will continue q15 checks

## 2023-07-01 NOTE — Group Note (Signed)
 LCSW Group Therapy Note 07/01/2023 Type of Therapy and Topic:  Group Therapy: Gratitude Participation Level:  Active Description of Group:   In this group, patients shared and discussed a positive experience that had beneficial impact on their life and those around them.  The group discussed how bringing the positive elements of their lives to the forefront of their minds can help with recovery from a physical or mental illness.  An exercise was done as a group to state mindfulness routines in order to reduce stress or anxiety level encourage participants to consider other potential positives in their lives.  Therapeutic Goals: Patients will participate by sharing a positive or impactful event that assisted to increase self-awareness and self-esteem.  Patients will discuss how to apply mindfulness technique with any situations. Patients will explore other coping skills to utilize during emotional dysregulation to assist with their daily tasks.   Summary of Patient Progress:  The patient shared their memories of successful event when he had his daughter and his grand daughter and is grateful for his family.  The patient was grateful and share his thoughts in groups Therapeutic Modalities:   Solution-Focused Therapy Activity  Granvil Djordjevic O Julias Mould, LCSWA 07/01/2023  1:44 PM

## 2023-07-01 NOTE — Plan of Care (Signed)
   Problem: Education: Goal: Knowledge of [facility] Education information/materials will improve Outcome: Progressing Goal: Emotional status will improve Outcome: Progressing Goal: Mental status will improve Outcome: Progressing Goal: Verbalization of understanding the information provided will improve Outcome: Progressing

## 2023-07-01 NOTE — Progress Notes (Signed)
   07/01/23 1100  Psych Admission Type (Psych Patients Only)  Admission Status Voluntary  Psychosocial Assessment  Patient Complaints Self-harm thoughts;Depression  Eye Contact Fair  Facial Expression Flat  Affect Appropriate to circumstance  Speech Logical/coherent  Interaction Assertive  Motor Activity Slow  Appearance/Hygiene Unremarkable  Behavior Characteristics Cooperative  Mood Depressed;Anxious  Thought Process  Coherency WDL  Content WDL  Delusions None reported or observed  Perception WDL  Hallucination None reported or observed  Judgment WDL  Confusion None  Danger to Self  Current suicidal ideation? Denies  Danger to Others  Danger to Others None reported or observed

## 2023-07-02 NOTE — Progress Notes (Signed)
 Adult Psychoeducational Group Note  Date:  07/02/2023 Time:  11:10 AM  Group Topic/Focus:  Goals Group:   The focus of this group is to help patients establish daily goals to achieve during treatment and discuss how the patient can incorporate goal setting into their daily lives to aide in recovery.  Participation Level:  Active  Participation Quality:  Appropriate  Affect:  Appropriate  Cognitive:  Appropriate  Insight: Appropriate  Engagement in Group:  Engaged  Modes of Intervention:  Discussion  Additional Comments:  Pt stated he is feeling good.  Pt goal for the day is to be patient.   Daine Pillar D 07/02/2023, 11:10 AM

## 2023-07-02 NOTE — Progress Notes (Addendum)
 [facility] MD Progress Note  07/02/2023 3:53 PM Toshiyuki Fredell  MRN:  [id number] Subjective:   63 year old Caucasian male, single, homeless, unemployed. Background history of MDD recurrent, anxiety disorder, alcohol use disorder in remission and multiple medical comorbidities.  Presented to the hospital by EMS.  Reported to have had seizures.  Reported history of worsening depression associated with thoughts of hanging himself.  Patient was recently stabilized and discharged from our unit.  He had not been adherent with his psychotropic medications.  Chart reviewed.  Patient discussed at multidisciplinary team meeting.  Nursing staff reports that he continues to report thought about negative rumination.  He has been adherent with his medication.  He slept for 4.75 hours last night.  He has been interacting more with the milieu.  No observed response to internal stimuli.  Nursing staff reports that his blood pressure has been low.  No falls or dizziness reported.  Seen today.  Patient has maintained progress made with adjustments made.  States that he is focused on giving the program in [place]  a trial.  He continues to dwell on the negative things that have happened in his life.  He reports indigestion which benefited from Protonix  in the past.  We have agreed to add this to his regimen.   Principal Problem: MDD (major depressive disorder), recurrent episode, severe (HCC) Diagnosis: Principal Problem:   MDD (major depressive disorder), recurrent episode, severe (HCC) Active Problems:   PTSD (post-traumatic stress disorder)   Nightmares associated with chronic post-traumatic stress disorder  Total Time spent with patient: 30 minutes  Past Psychiatric History:  See H&P  Past Medical History:  Past Medical History:  Diagnosis Date   Anxiety    Arthritis    knees and hands   Bipolar 1 disorder, depressed (HCC)    Bipolar disorder (HCC)    Depression    GERD  (gastroesophageal reflux disease)    Hepatitis    HEP C   History of kidney stones    Hypertension    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 echo 5/17: EF 60-65%, no RWMA, LV dias fxn nl, LA mildly dilated, RV sys fxn nl, PASP nl, moderate sized circumferential pericardial effusion was identified, 2.12 cm around the LV free wall, <1 cm around the RV free wall. Features were not c/w tamponade physiology   PTSD (post-traumatic stress disorder)    Witnessed brother's suicide.   Restless leg syndrome    Seizures (HCC)    Syncope     Past Surgical History:  Procedure Laterality Date   APPENDECTOMY     CARDIOVERSION N/A 04/17/2023   Procedure: CARDIOVERSION;  Surgeon: Darliss Rogue, MD;  Location: [facility] ORS;  Service: Cardiovascular;  Laterality: N/A;   CYSTOSCOPY WITH URETEROSCOPY AND STENT PLACEMENT     ESOPHAGOGASTRODUODENOSCOPY N/A 01/11/2016   Procedure: ESOPHAGOGASTRODUODENOSCOPY (EGD);  Surgeon: Jerrell Sol, MD;  Location: [facility] ENDOSCOPY;  Service: Endoscopy;  Laterality: N/A;   ESOPHAGOGASTRODUODENOSCOPY N/A 04/09/2020   Procedure: ESOPHAGOGASTRODUODENOSCOPY (EGD);  Surgeon: Therisa Bi, MD;  Location: [facility] ENDOSCOPY;  Service: Gastroenterology;  Laterality: N/A;   INCISION AND DRAINAGE ABSCESS Left 01/02/2018   Procedure: INCISION AND DRAINAGE LEFT KNEE;  Surgeon: Cleotilde Barrio, MD;  Location: [facility] ORS;  Service: Orthopedics;  Laterality: Left;   JOINT REPLACEMENT Right    TKR   KNEE ARTHROSCOPY Right 06/25/2014   Procedure: ARTHROSCOPY KNEE;  Surgeon: Barrio Cleotilde, MD;  Location: [facility]  ORS;  Service: Orthopedics;  Laterality: Right;  partial arthroscopic medial menisectomy   LAPAROSCOPIC APPENDECTOMY N/A 06/02/2021   Procedure: APPENDECTOMY LAPAROSCOPIC;  Surgeon: Lane Shope, MD;  Location: [facility] ORS;  Service: General;  Laterality: N/A;   TEE WITHOUT CARDIOVERSION N/A 04/17/2023   Procedure:  ECHOCARDIOGRAM, TRANSESOPHAGEAL;  Surgeon: Darliss Rogue, MD;  Location: [facility] ORS;  Service: Cardiovascular;  Laterality: N/A;   TOTAL KNEE ARTHROPLASTY Right 04/22/2015   Procedure: TOTAL KNEE ARTHROPLASTY;  Surgeon: Kayla Pinal, MD;  Location: [facility] ORS;  Service: Orthopedics;  Laterality: Right;   TOTAL KNEE ARTHROPLASTY Left 10/30/2017   Procedure: TOTAL KNEE ARTHROPLASTY;  Surgeon: Pinal Kayla, MD;  Location: [facility] ORS;  Service: Orthopedics;  Laterality: Left;   TOTAL KNEE REVISION Left 01/02/2018   Procedure: poly exchange of tibia and patella left knee;  Surgeon: Pinal Kayla, MD;  Location: [facility] ORS;  Service: Orthopedics;  Laterality: Left;   UMBILICAL HERNIA REPAIR  06/02/2021   Procedure: HERNIA REPAIR UMBILICAL ADULT;  Surgeon: Lane Shope, MD;  Location: [facility] ORS;  Service: General;;   Family History:  Family History  Problem Relation Age of Onset   CVA Mother        deceased at age 60   Depression Brother        Died by suicide at age 52   Family Psychiatric  History:  See H&P Social History:  Social History   Substance and Sexual Activity  Alcohol Use Not Currently   Comment: rare     Social History   Substance and Sexual Activity  Drug Use Not Currently   Types: Marijuana, Cocaine   Comment: last use January 2025, cocaine    Social History   Socioeconomic History   Marital status: Single    Spouse name: Not on file   Number of children: Not on file   Years of education: Not on file   Highest education level: Not on file  Occupational History   Not on file  Tobacco Use   Smoking status: Former    Current packs/day: 0.00    Average packs/day: 0.8 packs/day for 20.0 years (15.0 ttl pk-yrs)    Types: Cigarettes    Start date: 05/16/1964    Quit date: 05/16/1984    Years since quitting: 39.1   Smokeless tobacco: Never  Vaping Use   Vaping status: Never Used  Substance and Sexual Activity   Alcohol use: Not Currently    Comment: rare    Drug use: Not Currently    Types: Marijuana, Cocaine    Comment: last use January 2025, cocaine   Sexual activity: Not Currently  Other Topics Concern   Not on file  Social History Narrative   ** Merged History Encounter **       Social Drivers of Health   Financial Resource Strain: Not on file  Food Insecurity: Food Insecurity Present (06/18/2023)   Hunger Vital Sign    Worried About Running Out of Food in the Last Year: Often true    Ran Out of Food in the Last Year: Often true  Transportation Needs: No Transportation Needs (06/18/2023)   PRAPARE - Administrator, Civil Service (Medical): No    Lack of Transportation (Non-Medical): No  Physical Activity: Not on file  Stress: Not on file  Social Connections: Socially Isolated (02/15/2023)   Social Connection and Isolation Panel    Frequency of Communication with Friends and Family: Once a week    Frequency of Social Gatherings with Friends  and Family: Once a week    Attends Religious Services: Never    Active Member of Clubs or Organizations: No    Attends Engineer, structural: Never    Marital Status: Never married     Current Medications: Current Facility-Administered Medications  Medication Dose Route Frequency Provider Last Rate Last Admin   acetaminophen  (TYLENOL ) tablet 650 mg  650 mg Oral Q6H PRN Mills, Shnese E, NP   650 mg at 07/02/23 1510   alum & mag hydroxide-simeth (MAALOX/MYLANTA) 200-200-20 MG/5ML suspension 30 mL  30 mL Oral Q4H PRN Mills, Shnese E, NP   30 mL at 07/02/23 1253   amiodarone  (PACERONE ) tablet 200 mg  200 mg Oral Daily Zouev, Dmitri, MD   200 mg at 07/02/23 0809   ARIPiprazole  (ABILIFY ) tablet 5 mg  5 mg Oral QHS Ramona Slinger, Jerrell LABOR, MD   5 mg at 07/01/23 2102   atorvastatin  (LIPITOR) tablet 40 mg  40 mg Oral Daily Parker, Alvin S, MD   40 mg at 07/02/23 0809   haloperidol  (HALDOL ) tablet 5 mg  5 mg Oral TID PRN Moishe Bernadette BRAVO, NP       And   diphenhydrAMINE  (BENADRYL ) capsule  50 mg  50 mg Oral TID PRN Mills, Shnese E, NP       haloperidol  lactate (HALDOL ) injection 5 mg  5 mg Intramuscular TID PRN Moishe Bernadette BRAVO, NP       And   diphenhydrAMINE  (BENADRYL ) injection 50 mg  50 mg Intramuscular TID PRN Moishe Bernadette BRAVO, NP       And   LORazepam  (ATIVAN ) injection 2 mg  2 mg Intramuscular TID PRN Mills, Shnese E, NP       haloperidol  lactate (HALDOL ) injection 10 mg  10 mg Intramuscular TID PRN Moishe Bernadette BRAVO, NP       And   diphenhydrAMINE  (BENADRYL ) injection 50 mg  50 mg Intramuscular TID PRN Moishe Bernadette E, NP       And   LORazepam  (ATIVAN ) injection 2 mg  2 mg Intramuscular TID PRN Mills, Shnese E, NP       divalproex  (DEPAKOTE ) DR tablet 750 mg  750 mg Oral BID Mills, Shnese E, NP   750 mg at 07/02/23 0809   doxepin  (SINEQUAN ) capsule 25 mg  25 mg Oral QHS Ambra Haverstick, Jerrell LABOR, MD   25 mg at 07/01/23 2102   FLUoxetine  (PROZAC ) capsule 40 mg  40 mg Oral Daily Larkin Morelos, Jerrell LABOR, MD   40 mg at 07/02/23 0809   hydrOXYzine  (ATARAX ) tablet 25 mg  25 mg Oral TID PRN Mills, Shnese E, NP   25 mg at 07/02/23 1017   loperamide  (IMODIUM ) capsule 2 mg  2 mg Oral PRN Parker, Alvin S, MD   2 mg at 06/22/23 1656   magnesium  hydroxide (MILK OF MAGNESIA) suspension 30 mL  30 mL Oral Daily PRN Mills, Shnese E, NP       multivitamin with minerals tablet 1 tablet  1 tablet Oral Daily Moishe Bernadette E, NP   1 tablet at 07/02/23 0809   prazosin  (MINIPRESS ) capsule 4 mg  4 mg Oral QHS Prentis Oliva LABOR, MD   4 mg at 07/01/23 2101   topiramate  (TOPAMAX ) tablet 50 mg  50 mg Oral QHS Parker, Alvin S, MD   50 mg at 07/01/23 2102    Lab Results: No results found for this or any previous visit (from the past 48 hours).  Blood Alcohol level:  Lab  Results  Component Value Date   [facility] <15 05/29/2023   ETH <10 04/12/2023    Metabolic Disorder Labs: Lab Results  Component Value Date   HGBA1C 4.9 06/01/2023   MPG 93.93 06/01/2023   MPG 111.15 12/29/2022   No results found for:  PROLACTIN Lab Results  Component Value Date   CHOL 246 (H) 06/01/2023   TRIG 129 06/01/2023   HDL 37 (L) 06/01/2023   CHOLHDL 6.6 06/01/2023   VLDL 26 06/01/2023   LDLCALC 183 (H) 06/01/2023   LDLCALC 146 (H) 12/29/2022    Physical Findings: AIMS:  ,  ,  ,  ,  ,  ,   CIWA:    COWS:     Musculoskeletal: Strength & Muscle Tone: within normal limits Gait & Station: normal Patient leans: N/A  Psychiatric Specialty Exam:  Presentation  General Appearance:  Casually dressed, limited grooming, not in any distress.  No EPS  Eye Contact: Good.  Speech: Spontaneous.  Normal rate, tone and volume.  Mood and Affect  Mood: Worried but not pervasively down.  Affect: Restricted and appropriate.  Thought Process  Thought Processes: Linear and goal directed.  Descriptions of Associations:Intact  Orientation:Full (Time, Place and Person)  Thought Content: Future oriented.  No current suicidal thoughts.  Less negative and guilty ruminations.  No homicidal thoughts.  No thoughts of violence.  No delusional theme.  No obsessions.  Hallucinations: No hallucination in any modality.  Sensorium  Memory: Good.  Judgment: Good.  Insight: Good  Executive Functions  Concentration: Good.  Attention Span: Good.  Recall: Good.  Fund of Knowledge: Good.  Language: Good   Psychomotor Activity  Marginally better psychomotor activity.    Physical Exam: Physical Exam ROS Blood pressure 102/71, pulse 78, temperature 97.6 F (36.4 C), temperature source Oral, resp. rate 18, height 5' 9 (1.753 m), weight 82.6 kg, SpO2 96%. Body mass index is 26.88 kg/m.   Treatment Plan Summary: Patient still has residual symptoms of depression.  He has been accepted into the program and [facility] .  He is scheduled to be there on Tuesday.  We will maintain his current regimen.  1.  Fluoxetine  40 mg daily. 2.  Aripiprazole  to 5 mg daily. 3.  Depakote  750 mg  twice daily. 4.  Prazosin  4 mg at bedtime. 5.  Decrease doxepin  to 25 mg at bedtime.  6.  Discontinue amlodipine  due to hypotension. 7.  Continue to encourage unit groups and therapeutic activities. 8.  Continue to monitor mood behavior and interaction with others. 9.  Social worker will coordinate discharge and aftercare planning.   Jerrell DELENA Forehand, MD 07/02/2023, 3:53 PM

## 2023-07-02 NOTE — [facility] Group Notes (Signed)
 [facility] Group Notes:  (Nursing/MHT/Case Management/Adjunct)  Date:  07/02/2023  Time:  2:12 PM  Type of Therapy:  Psychoeducational Skills  Participation Level:  Active  Participation Quality:  Appropriate  Affect:  Blunted  Cognitive:  Appropriate  Insight:  Appropriate  Engagement in Group: active, sharing.  Modes of Intervention:  Discussion, Education, and Exploration  Summary of Progress/Problems: pt were given education on positive reframing as a tool for helping negative thoughts and mental health.

## 2023-07-02 NOTE — Plan of Care (Signed)
 Problem: Education: Goal: Knowledge of [facility] Education information/materials will improve Outcome: Progressing Goal: Emotional status will improve Outcome: Progressing Goal: Mental status will improve Outcome: Progressing Goal: Verbalization of understanding the information provided will improve Outcome: Progressing   Problem: Activity: Goal: Interest or engagement in activities will improve Outcome: Progressing Goal: Sleeping patterns will improve Outcome: Progressing   Problem: Coping: Goal: Ability to verbalize frustrations and anger appropriately will improve Outcome: Progressing Goal: Ability to demonstrate self-control will improve Outcome: Progressing   Problem: Health Behavior/Discharge Planning: Goal: Identification of resources available to assist in meeting health care needs will improve Outcome: Progressing Goal: Compliance with treatment plan for underlying cause of condition will improve Outcome: Progressing   Problem: Physical Regulation: Goal: Ability to maintain clinical measurements within normal limits will improve Outcome: Progressing   Problem: Safety: Goal: Periods of time without injury will increase Outcome: Progressing   Problem: Education: Goal: Ability to make informed decisions regarding treatment will improve Outcome: Progressing   Problem: Coping: Goal: Coping ability will improve Outcome: Progressing   Problem: Health Behavior/Discharge Planning: Goal: Identification of resources available to assist in meeting health care needs will improve Outcome: Progressing   Problem: Medication: Goal: Compliance with prescribed medication regimen will improve Outcome: Progressing   Problem: Self-Concept: Goal: Ability to disclose and discuss suicidal ideas will improve Outcome: Progressing Goal: Will verbalize positive feelings about self Outcome: Progressing Note: Patient is on track. Patient will maintain adherence     Problem: Education: Goal: Knowledge of [facility] [facility] Education information/materials will improve Outcome: Progressing Goal: Emotional status will improve Outcome: Progressing Goal: Mental status will improve Outcome: Progressing Goal: Verbalization of understanding the information provided will improve Outcome: Progressing   Problem: Activity: Goal: Interest or engagement in activities will improve Outcome: Progressing Goal: Sleeping patterns will improve Outcome: Progressing   Problem: Coping: Goal: Ability to verbalize frustrations and anger appropriately will improve Outcome: Progressing Goal: Ability to demonstrate self-control will improve Outcome: Progressing   Problem: Health Behavior/Discharge Planning: Goal: Identification of resources available to assist in meeting health care needs will improve Outcome: Progressing Goal: Compliance with treatment plan for underlying cause of condition will improve Outcome: Progressing   Problem: Physical Regulation: Goal: Ability to maintain clinical measurements within normal limits will improve Outcome: Progressing   Problem: Safety: Goal: Periods of time without injury will increase Outcome: Progressing   Problem: Education: Goal: Utilization of techniques to improve thought processes will improve Outcome: Progressing Goal: Knowledge of the prescribed therapeutic regimen will improve Outcome: Progressing   Problem: Activity: Goal: Interest or engagement in leisure activities will improve Outcome: Progressing Goal: Imbalance in normal sleep/wake cycle will improve Outcome: Progressing   Problem: Coping: Goal: Coping ability will improve Outcome: Progressing Goal: Will verbalize feelings Outcome: Progressing   Problem: Health Behavior/Discharge Planning: Goal: Ability to make decisions will improve Outcome: Progressing Goal: Compliance with therapeutic regimen will improve Outcome: Progressing    Problem: Role Relationship: Goal: Will demonstrate positive changes in social behaviors and relationships Outcome: Progressing   Problem: Safety: Goal: Ability to disclose and discuss suicidal ideas will improve Outcome: Progressing Goal: Ability to identify and utilize support systems that promote safety will improve Outcome: Progressing   Problem: Self-Concept: Goal: Will verbalize positive feelings about self Outcome: Progressing Goal: Level of anxiety will decrease Outcome: Progressing   Problem: Education: Goal: Ability to state activities that reduce stress will improve Outcome: Progressing   Problem: Coping: Goal: Ability to identify and develop effective coping behavior will  improve Outcome: Progressing   Problem: Self-Concept: Goal: Ability to identify factors that promote anxiety will improve Outcome: Progressing Goal: Level of anxiety will decrease Outcome: Progressing Goal: Ability to modify response to factors that promote anxiety will improve Outcome: Progressing

## 2023-07-02 NOTE — Progress Notes (Signed)
   07/02/23 1200  Psych Admission Type (Psych Patients Only)  Admission Status Voluntary  Psychosocial Assessment  Patient Complaints Self-harm thoughts;Decreased concentration;Anxiety;Other (Comment) (Pt reported constant thoughts of hanging self but contracted for safety while on unit.  GI upset reported this am and Maalox prn provided.  Pt intermittently reported anxiety and was able to manage with prn atarax  and supportive discussion.)  Eye Contact Fair  Facial Expression Anxious  Affect Appropriate to circumstance  Speech Logical/coherent  Interaction Assertive  Motor Activity Slow  Appearance/Hygiene Unremarkable;In scrubs  Behavior Characteristics Cooperative  Mood Anxious  Thought Process  Coherency WDL  Content WDL  Delusions None reported or observed  Perception WDL  Hallucination None reported or observed  Judgment Impaired  Confusion None  Danger to Self  Current suicidal ideation? Denies  Self-Injurious Behavior No self-injurious ideation or behavior indicators observed or expressed   Agreement Not to Harm Self Yes  Description of Agreement Verbal  Danger to Others  Danger to Others None reported or observed

## 2023-07-02 NOTE — Plan of Care (Signed)
  Problem: Education: Goal: Knowledge of [facility] Education information/materials will improve Outcome: Progressing Goal: Emotional status will improve Outcome: Progressing Goal: Mental status will improve Outcome: Progressing   Problem: Coping: Goal: Ability to verbalize frustrations and anger appropriately will improve Outcome: Progressing   Problem: Education: Goal: Knowledge of  General Education information/materials will improve Outcome: Progressing Goal: Emotional status will improve Outcome: Progressing Goal: Mental status will improve Outcome: Progressing   Problem: Coping: Goal: Ability to verbalize frustrations and anger appropriately will improve Outcome: Progressing

## 2023-07-02 NOTE — [facility] Group Notes (Signed)
 Adult Psychoeducational Group Note  Date:  07/02/2023 Time:  12:07 AM  Group Topic/Focus:  Wrap-Up Group:   The focus of this group is to help patients review their daily goal of treatment and discuss progress on daily workbooks.  Participation Level:  Active  Participation Quality:  Appropriate  Affect:  Appropriate  Cognitive:  Appropriate  Insight: Appropriate  Engagement in Group:  Engaged  Modes of Intervention:  Activity, Discussion, and Support  Additional Comments:  Pt rates day today an 8/10. Pt states goal today was to just let the day happen. Pt states goal was achieved.  Janijah Symons Handy 07/02/2023, 12:07 AM

## 2023-07-03 ENCOUNTER — Encounter (HOSPITAL_COMMUNITY): Payer: Self-pay

## 2023-07-03 MED ORDER — DOXEPIN HCL 25 MG PO CAPS
150.0000 mg | ORAL_CAPSULE | Freq: Every day | ORAL | Status: DC
  Administered 2023-07-03: 150 mg via ORAL
  Filled 2023-07-03: qty 84
  Filled 2023-07-03: qty 6

## 2023-07-03 MED ORDER — PANTOPRAZOLE SODIUM 20 MG PO TBEC
20.0000 mg | DELAYED_RELEASE_TABLET | Freq: Every day | ORAL | Status: DC
  Administered 2023-07-03 – 2023-07-04 (×2): 20 mg via ORAL
  Filled 2023-07-03: qty 1
  Filled 2023-07-03: qty 14
  Filled 2023-07-03: qty 1

## 2023-07-03 NOTE — Progress Notes (Signed)
  Lynwood [facility] [employer] with pt's PO, Officer Peri [phone] this morning. She is aware that patient is discharging to [facility] tomorrow for 1:00PM. PO has no concerns with [place] of [place].   Signed:  Mirza Fessel, LCSW-A 07/03/2023  12:02 PM

## 2023-07-03 NOTE — BH IP Treatment Plan (Unsigned)
 Interdisciplinary Treatment and Diagnostic Plan Update  07/03/2023 Time of Session: UPDATE [name] MRN: [id number]  Principal Diagnosis: MDD (major depressive disorder), recurrent episode, severe (HCC)  Secondary Diagnoses: Principal Problem:   MDD (major depressive disorder), recurrent episode, severe (HCC) Active Problems:   PTSD (post-traumatic stress disorder)   Nightmares associated with chronic post-traumatic stress disorder   Current Medications:  Current Facility-Administered Medications  Medication Dose Route Frequency Provider Last Rate Last Admin   acetaminophen  (TYLENOL ) tablet 650 mg  650 mg Oral Q6H PRN Mills, Shnese E, NP   650 mg at 07/02/23 1510   alum & mag hydroxide-simeth (MAALOX/MYLANTA) 200-200-20 MG/5ML suspension 30 mL  30 mL Oral Q4H PRN Mills, Shnese E, NP   30 mL at 07/03/23 9166   amiodarone  (PACERONE ) tablet 200 mg  200 mg Oral Daily Zouev, Dmitri, MD   200 mg at 07/03/23 0755   ARIPiprazole  (ABILIFY ) tablet 5 mg  5 mg Oral QHS [name], Jerrell LABOR, MD   5 mg at 07/02/23 2054   atorvastatin  (LIPITOR) tablet 40 mg  40 mg Oral Daily Parker, Alvin S, MD   40 mg at 07/03/23 0755   haloperidol  (HALDOL ) tablet 5 mg  5 mg Oral TID PRN Moishe Bernadette BRAVO, NP       And   diphenhydrAMINE  (BENADRYL ) capsule 50 mg  50 mg Oral TID PRN Mills, Shnese E, NP       haloperidol  lactate (HALDOL ) injection 5 mg  5 mg Intramuscular TID PRN Moishe Bernadette BRAVO, NP       And   diphenhydrAMINE  (BENADRYL ) injection 50 mg  50 mg Intramuscular TID PRN Moishe Bernadette BRAVO, NP       And   LORazepam  (ATIVAN ) injection 2 mg  2 mg Intramuscular TID PRN Mills, Shnese E, NP       haloperidol  lactate (HALDOL ) injection 10 mg  10 mg Intramuscular TID PRN Moishe Bernadette BRAVO, NP       And   diphenhydrAMINE  (BENADRYL ) injection 50 mg  50 mg Intramuscular TID PRN Moishe Bernadette E, NP       And   LORazepam  (ATIVAN ) injection 2 mg  2 mg Intramuscular TID PRN Mills, Shnese E, NP       divalproex   (DEPAKOTE ) DR tablet 750 mg  750 mg Oral BID Mills, Shnese E, NP   750 mg at 07/03/23 9244   doxepin  (SINEQUAN ) capsule 25 mg  25 mg Oral QHS [name], Jerrell LABOR, MD   25 mg at 07/02/23 2052   FLUoxetine  (PROZAC ) capsule 40 mg  40 mg Oral Daily [name], Jerrell LABOR, MD   40 mg at 07/03/23 0755   hydrOXYzine  (ATARAX ) tablet 25 mg  25 mg Oral TID PRN Mills, Shnese E, NP   25 mg at 07/02/23 1017   loperamide  (IMODIUM ) capsule 2 mg  2 mg Oral PRN Parker, Alvin S, MD   2 mg at 06/22/23 1656   magnesium  hydroxide (MILK OF MAGNESIA) suspension 30 mL  30 mL Oral Daily PRN Mills, Shnese E, NP       multivitamin with minerals tablet 1 tablet  1 tablet Oral Daily Moishe Bernadette E, NP   1 tablet at 07/03/23 0755   pantoprazole  (PROTONIX ) EC tablet 20 mg  20 mg Oral Daily Parker, Alvin S, MD   20 mg at 07/03/23 1455   prazosin  (MINIPRESS ) capsule 4 mg  4 mg Oral QHS Prentis Oliva LABOR, MD   4 mg at 07/02/23 2052   topiramate  (  TOPAMAX ) tablet 50 mg  50 mg Oral QHS Parker, Alvin S, MD   50 mg at 07/02/23 2052   PTA Medications: Medications Prior to Admission  Medication Sig Dispense Refill Last Dose/Taking   amiodarone  (PACERONE ) 200 MG tablet Take 1 tablet (200 mg total) by mouth daily. For cardiac issues. 30 tablet 0    atorvastatin  (LIPITOR) 20 MG tablet Take 1 tablet (20 mg total) by mouth daily. For hyperlipidemia. 30 tablet 0    divalproex  (DEPAKOTE ) 250 MG DR tablet Take 3 tablets (750 mg total) by mouth 2 (two) times daily. For mood stabilization 180 tablet 0    doxepin  (SINEQUAN ) 50 MG capsule Take 1 capsule (50 mg total) by mouth at bedtime. For sleep 30 capsule 0    escitalopram  (LEXAPRO ) 20 MG tablet Take 1 tablet (20 mg total) by mouth at bedtime. For depression 30 tablet 0    feeding supplement (ENSURE ENLIVE / ENSURE PLUS) LIQD Take 237 mLs by mouth 3 (three) times daily between meals. 237 mL 12    hydrOXYzine  (ATARAX ) 25 MG tablet Take 1 tablet (25 mg total) by mouth 3 (three) times daily as needed  for anxiety. 75 tablet 0    lurasidone  (LATUDA ) 20 MG TABS tablet Take 1 tablet (20 mg total) by mouth daily with supper. For mood control 30 tablet 0    melatonin 5 MG TABS Take 1 tablet (5 mg total) by mouth at bedtime. For sleep 30 tablet 0    metoprolol  succinate (TOPROL -XL) 25 MG 24 hr tablet Take 1 tablet (25 mg total) by mouth daily. For high blood pressure. 30 tablet 0    Multiple Vitamin (MULTIVITAMIN WITH MINERALS) TABS tablet Take 1 tablet by mouth daily. 30 tablet 0    prazosin  (MINIPRESS ) 2 MG capsule Take 1 capsule (2 mg total) by mouth at bedtime. For nightmares. 30 capsule 0    tamsulosin  (FLOMAX ) 0.4 MG CAPS capsule Take 1 capsule (0.4 mg total) by mouth daily after supper. For Prostate health 30 capsule 0     Patient Stressors: Financial difficulties   Loss of housing    Patient Strengths: Average or above average intelligence  Capable of independent living  Communication skills  Motivation for treatment/growth   Treatment Modalities: Medication Management, Group therapy, Case management,  1 to 1 session with clinician, Psychoeducation, Recreational therapy.   Physician Treatment Plan for Primary Diagnosis: MDD (major depressive disorder), recurrent episode, severe (HCC) Long Term Goal(s):     Short Term Goals:    Medication Management: Evaluate patient's response, side effects, and tolerance of medication regimen.  Therapeutic Interventions: 1 to 1 sessions, Unit Group sessions and Medication administration.  Evaluation of Outcomes: Progressing  Physician Treatment Plan for Secondary Diagnosis: Principal Problem:   MDD (major depressive disorder), recurrent episode, severe (HCC) Active Problems:   PTSD (post-traumatic stress disorder)   Nightmares associated with chronic post-traumatic stress disorder  Long Term Goal(s):     Short Term Goals:       Medication Management: Evaluate patient's response, side effects, and tolerance of medication  regimen.  Therapeutic Interventions: 1 to 1 sessions, Unit Group sessions and Medication administration.  Evaluation of Outcomes: Not Progressing   RN Treatment Plan for Primary Diagnosis: MDD (major depressive disorder), recurrent episode, severe (HCC) Long Term Goal(s): Knowledge of disease and therapeutic regimen to maintain health will improve  Short Term Goals: Ability to verbalize frustration and anger appropriately will improve, Ability to verbalize feelings will improve, Ability to identify and  develop effective coping behaviors will improve, and Compliance with prescribed medications will improve  Medication Management: RN will administer medications as ordered by provider, will assess and evaluate patient's response and provide education to patient for prescribed medication. RN will report any adverse and/or side effects to prescribing provider.  Therapeutic Interventions: 1 on 1 counseling sessions, Psychoeducation, Medication administration, Evaluate responses to treatment, Monitor vital signs and CBGs as ordered, Perform/monitor CIWA, COWS, AIMS and Fall Risk screenings as ordered, Perform wound care treatments as ordered.  Evaluation of Outcomes: Adequate for Discharge   LCSW Treatment Plan for Primary Diagnosis: MDD (major depressive disorder), recurrent episode, severe (HCC) Long Term Goal(s): Safe transition to appropriate next level of care at discharge, Engage patient in therapeutic group addressing interpersonal concerns.  Short Term Goals: Engage patient in aftercare planning with referrals and resources, Increase ability to appropriately verbalize feelings, Facilitate patient progression through stages of change regarding substance use diagnoses and concerns, Identify triggers associated with mental health/substance abuse issues, and Increase skills for wellness and recovery  Therapeutic Interventions: Assess for all discharge needs, 1 to 1 time with Social worker,  Explore available resources and support systems, Assess for adequacy in community support network, Educate family and significant other(s) on suicide prevention, Complete Psychosocial Assessment, Interpersonal group therapy.  Evaluation of Outcomes: Progressing   Progress in Treatment: Attending groups: No. Participating in groups: No. Taking medication as prescribed: Yes. Toleration medication: Yes. Family/Significant other contact made: Yes, contacted Team Lead Laymon P[place] [phone] Patient understands diagnosis: Yes. Discussing patient identified problems/goals with staff: Yes. Medical problems stabilized or resolved: Yes. Denies suicidal/homicidal ideation: Yes. Issues/concerns per patient self-inventory: No.   New problem(s) identified: No, Describe:  None   New Short Term/Long Term Goal(s): medication stabilization, elimination of SI thoughts, development of comprehensive mental wellness plan.    Patient Goals:  I'm hoping to get into drug rehab   Discharge Plan or Barriers: Patient recently admitted. CSW will continue to follow and assess for appropriate referrals and possible discharge planning.    Reason for Continuation of Hospitalization: Anxiety Depression Suicidal ideation Other; describe mood stabilization, discharge  planning   Estimated Length of Stay: 1-2 DAYS  Last 3 [place] Suicide Severity Risk Score: Flowsheet Row [facility] Admission (Discharged[facility]  C-SSRS RISK CATEGORY High Risk High Risk Moderate Risk    Last PHQ 2/9 Scores:     No data to display          Scribe for Treatment Team: Hula Tasso N Shericka Johnstone, LCSW 07/03/2023 3:38 PM

## 2023-07-03 NOTE — BHH Suicide Risk Assessment (Signed)
 [facility] Discharge Suicide Risk Assessment   Principal Problem: MDD (major depressive disorder), recurrent episode, severe (HCC)  Discharge Diagnoses: Principal Problem:   MDD (major depressive disorder), recurrent episode, severe (HCC) Active Problems:   PTSD (post-traumatic stress disorder)   Nightmares associated with chronic post-traumatic stress disorder      Total Time spent with patient: 40  Musculoskeletal: Strength & Muscle Tone: within normal limits Gait & Station: normal Patient leans: N/A   Psychiatric Specialty Exam:  Presentation  General Appearance: Appropriate for Environment  Eye Contact: Good  Speech: Clear and Coherent; Normal Rate  Speech Volume: Normal  Handedness: Right   Mood and Affect  Mood: Euthymic  Affect: Congruent   Thought Process  Thought Processes: Linear  Descriptions of Associations: Intact  Orientation: Full (Time, Place and Person)  Thought Content: Logical  History of Schizophrenia/Schizoaffective disorder: No  Duration of Psychotic Symptoms: NA Hallucinations: Hallucinations: None  Ideas of Reference: None  Suicidal Thoughts: Suicidal Thoughts: No  Homicidal Thoughts: Homicidal Thoughts: No   Sensorium  Memory: Immediate Good  Judgment: Fair  Insight: Fair   Art therapist  Concentration: Good  Attention Span: Good  Recall: Good  Fund of Knowledge: Good  Language: Good   Psychomotor Activity  Psychomotor Activity: Psychomotor Activity: Normal   Assets  Assets: Communication Skills; Social Support; Housing; Leisure Time   Sleep  Sleep: Sleep: Good   Physical Exam: General: Sitting comfortably. NAD. HEENT: Normocephalic, atraumatic, MMM, EMOI Lungs: no increased work of breathing noted Heart: no cyanosis Abdomen: Non distended Musculoskeletal: FROM. No obvious deformities Skin: Warm, dry, intact. No rashes noted Neuro: No obvious focal deficits.  Gait and station are  normal  Review of Systems  Constitutional: Negative.   HENT: Negative.    Eyes: Negative.   Respiratory: Negative.    Cardiovascular: Negative.   Gastrointestinal: Negative.   Genitourinary: Negative.   Skin: Negative.   Neurological: Negative.   Psychiatric/Behavioral:  Negative   Mental Status Per Nursing Assessment: Suicidal ideation indicated by patient  Demographic Factors:  Male, Low socioeconomic status, and Unemployed  Loss Factors: Loss of significant relationship  Historical Factors: Family history of mental illness or substance abuse  Risk Reduction Factors:   Sense of responsibility to family and Religious beliefs about death  Continued Clinical Symptoms:  Previous psychiatric diagnoses and treatments  Cognitive Features That Contribute To Risk:  None  Suicide Risk:  Minimal: No identifiable suicidal ideation.  Patients presenting with no risk factors but with morbid ruminations; may be classified as minimal risk based on the severity of the depressive symptoms.    Follow-up Information     [facility] Follow up.   Why: You are established with this organization for CST. You will receive therapy and medication management when you discharge. You will receive a call from the office or [place] to connect for your appointment. Contact information: Address: [address] [place], [place]  Office #: [phone] [place] #: [phone] Fax #: [phone]        [facility] Follow up.   Why: You have been accepted to this facility for substance use treatment. You will arrive there on Tuesday 6/24 at 1:00PM. You will receive therapy and medication management and therapy while at this program. Contact information: Address: [address] [place], [place] [address]  Phone: [phone]                 Plan Of Care/Follow-up recommendations:  Activity: as tolerated  Diet:  heart  healthy  Other: -Follow-up with your outpatient psychiatric provider -instructions on appointment date, time, and address (location) are provided to you in discharge paperwork.  -Take your psychiatric medications as prescribed at discharge - instructions are provided to you in the discharge paperwork  -Follow-up with outpatient primary care doctor and other specialists -for management of preventative medicine and chronic medical issues  -Testing: Follow-up with outpatient provider for any abnormal lab results (if any)  -If you are prescribed an atypical antipsychotic medication, we recommend that your outpatient psychiatrist follow routine screening for side effects within 3 months of discharge, including monitoring: AIMS scale, height, weight, blood pressure, fasting lipid panel, HbA1c, and fasting blood sugar.   -Recommend total abstinence from alcohol, tobacco, and other illicit drug use at discharge.   -If your psychiatric symptoms recur, worsen, or if you have side effects to your psychiatric medications, call your outpatient psychiatric provider, 911, [phone] or go to the nearest emergency department.  -If suicidal thoughts occur, immediately call your outpatient psychiatric provider, 911, [phone] or go to the nearest emergency department.   Glendia Kitty, MD 07/03/23 6:24 PM

## 2023-07-03 NOTE — Group Note (Unsigned)
 Date:  07/03/2023 Time:  11:10 AM  Group Topic/Focus:  Goals Group:   The focus of this group is to help patients establish daily goals to achieve during treatment and discuss how the patient can incorporate goal setting into their daily lives to aide in recovery.     Participation Level:  {BHH PARTICIPATION OZCZO:77735}  Participation Quality:  {BHH PARTICIPATION QUALITY:22265}  Affect:  {BHH AFFECT:22266}  Cognitive:  {BHH COGNITIVE:22267}  Insight: {BHH Insight2:20797}  Engagement in Group:  {BHH ENGAGEMENT IN HMNLE:77731}  Modes of Intervention:  {BHH MODES OF INTERVENTION:22269}  Additional Comments:  ***  [name] 07/03/2023, 11:10 AM

## 2023-07-03 NOTE — Plan of Care (Signed)
   Problem: Education: Goal: Knowledge of [facility] Education information/materials will improve Outcome: Progressing Goal: Emotional status will improve Outcome: Progressing Goal: Mental status will improve Outcome: Progressing Goal: Verbalization of understanding the information provided will improve Outcome: Progressing

## 2023-07-03 NOTE — Group Note (Unsigned)
 Date:  07/03/2023 Time:  9:16 AM  Group Topic/Focus:  Goals Group:   The focus of this group is to help patients establish daily goals to achieve during treatment and discuss how the patient can incorporate goal setting into their daily lives to aide in recovery.     Participation Level:  {BHH PARTICIPATION OZCZO:77735}  Participation Quality:  {BHH PARTICIPATION QUALITY:22265}  Affect:  {BHH AFFECT:22266}  Cognitive:  {BHH COGNITIVE:22267}  Insight: {BHH Insight2:20797}  Engagement in Group:  {BHH ENGAGEMENT IN HMNLE:77731}  Modes of Intervention:  {BHH MODES OF INTERVENTION:22269}  Additional Comments:  ***  [name] 07/03/2023, 9:16 AM

## 2023-07-03 NOTE — Group Note (Signed)
 Date:  07/03/2023 Time:  9:25 PM  Group Topic/Focus:  Wrap-Up Group:   The focus of this group is to help patients review their daily goal of treatment and discuss progress on daily workbooks.    Additional Comments:   Pt attended the AA meeting this evening.   [name] 07/03/2023, 9:25 PM

## 2023-07-03 NOTE — Group Note (Signed)
 Recreation Therapy Group Note   Group Topic:Problem Solving  Group Date: 07/03/2023 Start Time: 0930 End Time: 1005 Facilitators: Shields Pautz-McCall, LRT,CTRS Location: [address] Dayroom   Group Topic: Communication, Team Building, Problem Solving  Goal Area(s) Addresses:  Patient will effectively work with peer towards shared goal.  Patient will identify skills used to make activity successful.  Patient will share challenges and verbalize solution-driven approaches used. Patient will identify how skills used during activity can be used to reach post d/c goals.   Behavioral Response: Engaged  Intervention: STEM Activity   Activity: [employer]. Patients were provided the following materials: 5 drinking straws, 5 rubber bands, 5 paper clips, 2 index cards, and 2 drinking cups. Using the provided materials patients were asked to build a launching mechanism to launch a ping pong ball across the room, approximately 10 feet. Patients were divided into teams of 3-5. Instructions required all materials be incorporated into the device, functionality of items left to the peer group's discretion.  Education: Pharmacist, community, Scientist, physiological, Air cabin crew, Building control surveyor.   Education Outcome: Acknowledges education/In group clarification offered/Needs additional education.    Affect/Mood: Appropriate   Participation Level: Engaged   Participation Quality: Independent   Behavior: Appropriate   Speech/Thought Process: Focused   Insight: Good   Judgement: Good   Modes of Intervention: Problem-solving   Patient Response to Interventions:  Engaged   Education Outcome:  In group clarification offered    Clinical Observations/Individualized Feedback: Pt was focused and engaged. Pt worked well with peers in coming up with a concept and putting it together and making it work.     Plan: Continue to engage patient in RT group sessions 2-3x/week.   [name],  LRT,CTRS 07/03/2023 1:04 PM

## 2023-07-03 NOTE — Progress Notes (Signed)
   07/03/23 0755  Psych Admission Type (Psych Patients Only)  Admission Status Voluntary  Psychosocial Assessment  Patient Complaints Self-harm thoughts;Anxiety;Depression  Eye Contact Fair  Facial Expression Anxious  Affect Appropriate to circumstance  Speech Logical/coherent  Interaction Assertive  Motor Activity Slow  Appearance/Hygiene Unremarkable  Behavior Characteristics Cooperative  Mood Anxious  Thought Process  Coherency WDL  Content WDL  Delusions None reported or observed  Perception WDL  Hallucination None reported or observed  Judgment Poor  Confusion None  Danger to Self  Current suicidal ideation? Passive  Description of Suicide Plan no plan  Self-Injurious Behavior Some self-injurious ideation observed or expressed.  No lethal plan expressed   Agreement Not to Harm Self Yes  Description of Agreement verbal  Danger to Others  Danger to Others None reported or observed

## 2023-07-03 NOTE — Progress Notes (Signed)
 [facility] MD Progress Note  07/03/2023 6:23 PM [name]  MRN:  [id number] Subjective:   63 year old Caucasian male, single, homeless, unemployed. Background history of MDD recurrent, anxiety disorder, alcohol use disorder in remission and multiple medical comorbidities.  Presented to the hospital by EMS.  Reported to have had seizures.  Reported history of worsening depression associated with thoughts of hanging himself.  Patient was recently stabilized and discharged from our unit.  He had not been adherent with his psychotropic medications.  Chart reviewed.  Patient discussed at multidisciplinary team meeting.  [name] was seen in his room during rounds.  He reported that his mood was good today.  He denies any new psychiatric or medical complaints.  He reports that his appetite is good.  He reports that focus and concentration are adequate.  He denies issues with energy.  He reports adequate sleep.  He denies any medication side effects.  He denies suicidal ideations, homicidal ideations, auditory hallucinations, visual hallucinations, or delusions.  Will plan to DC tomorrow.   Principal Problem: MDD (major depressive disorder), recurrent episode, severe (HCC) Diagnosis: Principal Problem:   MDD (major depressive disorder), recurrent episode, severe (HCC) Active Problems:   PTSD (post-traumatic stress disorder)   Nightmares associated with chronic post-traumatic stress disorder  Total Time spent with patient: 30 minutes  Past Psychiatric History:  See H&P  Past Medical History:  Past Medical History:  Diagnosis Date   Anxiety    Arthritis    knees and hands   Bipolar 1 disorder, depressed (HCC)    Bipolar disorder (HCC)    Depression    GERD (gastroesophageal reflux disease)    Hepatitis    HEP C   History of kidney stones    Hypertension    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 echo 5/17: EF 60-65%, no  RWMA, LV dias fxn nl, LA mildly dilated, RV sys fxn nl, PASP nl, moderate sized circumferential pericardial effusion was identified, 2.12 cm around the LV free wall, <1 cm around the RV free wall. Features were not c/w tamponade physiology   PTSD (post-traumatic stress disorder)    Witnessed brother's suicide.   Restless leg syndrome    Seizures (HCC)    Syncope     Past Surgical History:  Procedure Laterality Date   APPENDECTOMY     CARDIOVERSION N/A 04/17/2023   Procedure: CARDIOVERSION;  Surgeon: Darliss Rogue, MD;  Location: [facility] ORS;  Service: Cardiovascular;  Laterality: N/A;   CYSTOSCOPY WITH URETEROSCOPY AND STENT PLACEMENT     ESOPHAGOGASTRODUODENOSCOPY N/A 01/11/2016   Procedure: ESOPHAGOGASTRODUODENOSCOPY (EGD);  Surgeon: Jerrell Sol, MD;  Location[facility] ENDOSCOPY;  Service: Endoscopy;  Laterality: N/A;   ESOPHAGOGASTRODUODENOSCOPY N/A 04/09/2020   Procedure: ESOPHAGOGASTRODUODENOSCOPY (EGD);  Surgeon: Therisa Bi, MD;  Location: [facility] ENDOSCOPY;  Service: Gastroenterology;  Laterality: N/A;   INCISION AND DRAINAGE ABSCESS Left 01/02/2018   Procedure: INCISION AND DRAINAGE LEFT KNEE;  Surgeon: Cleotilde Barrio, MD;  Location: [facility] ORS;  Service: Orthopedics;  Laterality: Left;   JOINT REPLACEMENT Right    TKR   KNEE ARTHROSCOPY Right 06/25/2014   Procedure: ARTHROSCOPY KNEE;  Surgeon: Barrio Cleotilde, MD;  Location: [facility] ORS;  Service: Orthopedics;  Laterality: Right;  partial arthroscopic medial menisectomy   LAPAROSCOPIC APPENDECTOMY N/A 06/02/2021   Procedure: APPENDECTOMY LAPAROSCOPIC;  Surgeon: Lane Shope, MD;  Location: [facility] ORS;  Service: General;  Laterality: N/A;   TEE WITHOUT CARDIOVERSION N/A 04/17/2023  Procedure: ECHOCARDIOGRAM, TRANSESOPHAGEAL;  Surgeon: Darliss Rogue, MD;  Location: [facility] ORS;  Service: Cardiovascular;  Laterality: N/A;   TOTAL KNEE ARTHROPLASTY Right 04/22/2015   Procedure: TOTAL KNEE ARTHROPLASTY;  Surgeon: Kayla Pinal, MD;   Location: [facility] ORS;  Service: Orthopedics;  Laterality: Right;   TOTAL KNEE ARTHROPLASTY Left 10/30/2017   Procedure: TOTAL KNEE ARTHROPLASTY;  Surgeon: Pinal Kayla, MD;  Location: [facility] ORS;  Service: Orthopedics;  Laterality: Left;   TOTAL KNEE REVISION Left 01/02/2018   Procedure: poly exchange of tibia and patella left knee;  Surgeon: Pinal Kayla, MD;  Location: [facility] ORS;  Service: Orthopedics;  Laterality: Left;   UMBILICAL HERNIA REPAIR  06/02/2021   Procedure: HERNIA REPAIR UMBILICAL ADULT;  Surgeon: Lane Shope, MD;  Location: [facility] ORS;  Service: General;;   Family History:  Family History  Problem Relation Age of Onset   CVA Mother        deceased at age 68   Depression Brother        Died by suicide at age 80   Family Psychiatric  History:  See H&P Social History:  Social History   Substance and Sexual Activity  Alcohol Use Not Currently   Comment: rare     Social History   Substance and Sexual Activity  Drug Use Not Currently   Types: Marijuana, Cocaine   Comment: last use January 2025, cocaine    Social History   Socioeconomic History   Marital status: Single    Spouse name: Not on file   Number of children: Not on file   Years of education: Not on file   Highest education level: Not on file  Occupational History   Not on file  Tobacco Use   Smoking status: Former    Current packs/day: 0.00    Average packs/day: 0.8 packs/day for 20.0 years (15.0 ttl pk-yrs)    Types: Cigarettes    Start date: 05/16/1964    Quit date: 05/16/1984    Years since quitting: 39.1   Smokeless tobacco: Never  Vaping Use   Vaping status: Never Used  Substance and Sexual Activity   Alcohol use: Not Currently    Comment: rare   Drug use: Not Currently    Types: Marijuana, Cocaine    Comment: last use January 2025, cocaine   Sexual activity: Not Currently  Other Topics Concern   Not on file  Social History Narrative   ** Merged History Encounter **       Social  Drivers of Health   Financial Resource Strain: Not on file  Food Insecurity: Food Insecurity Present (06/18/2023)   Hunger Vital Sign    Worried About Running Out of Food in the Last Year: Often true    Ran Out of Food in the Last Year: Often true  Transportation Needs: No Transportation Needs (06/18/2023)   PRAPARE - Administrator, Civil Service (Medical): No    Lack of Transportation (Non-Medical): No  Physical Activity: Not on file  Stress: Not on file  Social Connections: Socially Isolated (02/15/2023)   Social Connection and Isolation Panel    Frequency of Communication with Friends and Family: Once a week    Frequency of Social Gatherings with Friends and Family: Once a week    Attends Religious Services: Never    Database administrator or Organizations: No    Attends Banker Meetings: Never    Marital Status: Never married     Current Medications: Current Facility-Administered Medications  Medication Dose Route Frequency Provider Last Rate Last Admin   acetaminophen  (TYLENOL ) tablet 650 mg  650 mg Oral Q6H PRN Mills, Shnese E, NP   650 mg at 07/02/23 1510   alum & mag hydroxide-simeth (MAALOX/MYLANTA) 200-200-20 MG/5ML suspension 30 mL  30 mL Oral Q4H PRN Mills, Shnese E, NP   30 mL at 07/03/23 9166   amiodarone  (PACERONE ) tablet 200 mg  200 mg Oral Daily Zouev, Dmitri, MD   200 mg at 07/03/23 0755   ARIPiprazole  (ABILIFY ) tablet 5 mg  5 mg Oral QHS [name], Jerrell LABOR, MD   5 mg at 07/02/23 2054   atorvastatin  (LIPITOR) tablet 40 mg  40 mg Oral Daily Meranda Dechaine S, MD   40 mg at 07/03/23 0755   haloperidol  (HALDOL ) tablet 5 mg  5 mg Oral TID PRN Moishe Bernadette BRAVO, NP       And   diphenhydrAMINE  (BENADRYL ) capsule 50 mg  50 mg Oral TID PRN Mills, Shnese E, NP       haloperidol  lactate (HALDOL ) injection 5 mg  5 mg Intramuscular TID PRN Moishe Bernadette BRAVO, NP       And   diphenhydrAMINE  (BENADRYL ) injection 50 mg  50 mg Intramuscular TID PRN Moishe Bernadette BRAVO,  NP       And   LORazepam  (ATIVAN ) injection 2 mg  2 mg Intramuscular TID PRN Mills, Shnese E, NP       haloperidol  lactate (HALDOL ) injection 10 mg  10 mg Intramuscular TID PRN Moishe Bernadette BRAVO, NP       And   diphenhydrAMINE  (BENADRYL ) injection 50 mg  50 mg Intramuscular TID PRN Moishe Bernadette E, NP       And   LORazepam  (ATIVAN ) injection 2 mg  2 mg Intramuscular TID PRN Mills, Shnese E, NP       divalproex  (DEPAKOTE ) DR tablet 750 mg  750 mg Oral BID Mills, Shnese E, NP   750 mg at 07/03/23 1635   doxepin  (SINEQUAN ) capsule 150 mg  150 mg Oral QHS Rilea Arutyunyan S, MD       hydrOXYzine  (ATARAX ) tablet 25 mg  25 mg Oral TID PRN Mills, Shnese E, NP   25 mg at 07/02/23 1017   loperamide  (IMODIUM ) capsule 2 mg  2 mg Oral PRN Mykiah Schmuck S, MD   2 mg at 06/22/23 1656   magnesium  hydroxide (MILK OF MAGNESIA) suspension 30 mL  30 mL Oral Daily PRN Mills, Shnese E, NP       multivitamin with minerals tablet 1 tablet  1 tablet Oral Daily Moishe Bernadette E, NP   1 tablet at 07/03/23 0755   pantoprazole  (PROTONIX ) EC tablet 20 mg  20 mg Oral Daily Laiah Pouncey S, MD   20 mg at 07/03/23 1455   prazosin  (MINIPRESS ) capsule 4 mg  4 mg Oral QHS Prentis Oliva LABOR, MD   4 mg at 07/02/23 2052   topiramate  (TOPAMAX ) tablet 50 mg  50 mg Oral QHS Quaniyah Bugh S, MD   50 mg at 07/02/23 2052    Lab Results: No results found for this or any previous visit (from the past 48 hours).  Blood Alcohol level:  Lab Results  Component Value Date   [facility] <15 05/29/2023   ETH <10 04/12/2023    Metabolic Disorder Labs: Lab Results  Component Value Date   HGBA1C 4.9 06/01/2023   MPG 93.93 06/01/2023   MPG 111.15 12/29/2022   No results found for: PROLACTIN Lab  Results  Component Value Date   CHOL 246 (H) 06/01/2023   TRIG 129 06/01/2023   HDL 37 (L) 06/01/2023   CHOLHDL 6.6 06/01/2023   VLDL 26 06/01/2023   LDLCALC 183 (H) 06/01/2023   LDLCALC 146 (H) 12/29/2022    Physical Findings: AIMS:  ,  ,  ,  ,   ,  ,   CIWA:    COWS:     Musculoskeletal: Strength & Muscle Tone: within normal limits Gait & Station: normal Patient leans: N/A  Psychiatric Specialty Exam:  Presentation  General Appearance:  Casually dressed, limited grooming, not in any distress.  No EPS  Eye Contact: Good.  Speech: Spontaneous.  Normal rate, tone and volume.  Mood and Affect  Mood: Worried but not pervasively down.  Affect: Restricted and appropriate.  Thought Process  Thought Processes: Linear and goal directed.  Descriptions of Associations:Intact  Orientation:Full (Time, Place and Person)  Thought Content: Future oriented.  No current suicidal thoughts.  Less negative and guilty ruminations.  No homicidal thoughts.  No thoughts of violence.  No delusional theme.  No obsessions.  Hallucinations: No hallucination in any modality.  Sensorium  Memory: Good.  Judgment: Good.  Insight: Good  Executive Functions  Concentration: Good.  Attention Span: Good.  Recall: Good.  Fund of Knowledge: Good.  Language: Good   Psychomotor Activity  Marginally better psychomotor activity.    Physical Exam: Physical Exam ROS Blood pressure (!) 144/87, pulse 88, temperature (!) 97.5 F (36.4 C), temperature source Oral, resp. rate 12, height 5' 9 (1.753 m), weight 82.6 kg, SpO2 96%. Body mass index is 26.88 kg/m.   Treatment Plan Summary: Patient still has residual symptoms of depression.  He has been accepted into the program and [facility] .  He is scheduled to be there on Tuesday.  Plan to discharge tomorrow morning.  1.  Discontinue fluoxetine  2.  Aripiprazole  to 5 mg daily. 3.  Depakote  750 mg twice daily. 4.  Prazosin  4 mg at bedtime. 5.  Increase doxepin  back to 150 mg nightly 6.  Discontinue amlodipine  due to hypotension. 7.  Continue to encourage unit groups and therapeutic activities. 8.  Continue to monitor mood behavior and interaction with  others. 9.  Social worker will coordinate discharge and aftercare planning.   Starleen GORMAN Kitty, MD 07/03/2023, 6:23 PM

## 2023-07-04 ENCOUNTER — Other Ambulatory Visit (HOSPITAL_COMMUNITY): Payer: Self-pay

## 2023-07-04 MED ORDER — AMIODARONE HCL 200 MG PO TABS: 200.0000 mg | ORAL_TABLET | Freq: Every day | ORAL | Status: DC

## 2023-07-04 MED ORDER — ARIPIPRAZOLE 5 MG PO TABS
5.0000 mg | ORAL_TABLET | Freq: Every day | ORAL | 0 refills | Status: DC
  Filled 2023-07-04: qty 16, 16d supply, fill #0

## 2023-07-04 MED ORDER — TOPIRAMATE 50 MG PO TABS
50.0000 mg | ORAL_TABLET | Freq: Every day | ORAL | 0 refills | Status: DC
  Filled 2023-07-04: qty 16, 16d supply, fill #0

## 2023-07-04 MED ORDER — PANTOPRAZOLE SODIUM 20 MG PO TBEC
20.0000 mg | DELAYED_RELEASE_TABLET | Freq: Every day | ORAL | 0 refills | Status: DC
Start: 2023-07-04 — End: 2023-07-04
  Filled 2023-07-04: qty 16, 16d supply, fill #0

## 2023-07-04 MED ORDER — TOPIRAMATE 50 MG PO TABS: 50.0000 mg | ORAL_TABLET | Freq: Every day | ORAL | Status: DC

## 2023-07-04 MED ORDER — AMIODARONE HCL 200 MG PO TABS
200.0000 mg | ORAL_TABLET | Freq: Every day | ORAL | 0 refills | Status: DC
  Filled 2023-07-04: qty 16, 16d supply, fill #0

## 2023-07-04 MED ORDER — PRAZOSIN HCL 5 MG PO CAPS
5.0000 mg | ORAL_CAPSULE | Freq: Every day | ORAL | 0 refills | Status: DC
  Filled 2023-07-04: qty 16, 16d supply, fill #0

## 2023-07-04 MED ORDER — DIVALPROEX SODIUM 500 MG PO DR TAB
DELAYED_RELEASE_TABLET | ORAL | 0 refills | Status: DC
  Filled 2023-07-04: qty 48, 16d supply, fill #0

## 2023-07-04 MED ORDER — DOXEPIN HCL 100 MG PO CAPS: 100.0000 mg | ORAL_CAPSULE | Freq: Every day | ORAL | Status: DC

## 2023-07-04 MED ORDER — DIVALPROEX SODIUM 250 MG PO DR TAB
750.0000 mg | DELAYED_RELEASE_TABLET | Freq: Two times a day (BID) | ORAL | Status: DC
  Administered 2023-07-04: 750 mg via ORAL
  Filled 2023-07-04: qty 3
  Filled 2023-07-04: qty 84

## 2023-07-04 MED ORDER — DIVALPROEX SODIUM 500 MG PO DR TAB: DELAYED_RELEASE_TABLET | ORAL | Status: DC

## 2023-07-04 MED ORDER — ATORVASTATIN CALCIUM 40 MG PO TABS
40.0000 mg | ORAL_TABLET | Freq: Every day | ORAL | 0 refills | Status: DC
  Filled 2023-07-04: qty 16, 16d supply, fill #0

## 2023-07-04 MED ORDER — DOXEPIN HCL 100 MG PO CAPS
100.0000 mg | ORAL_CAPSULE | Freq: Every day | ORAL | 0 refills | Status: DC
  Filled 2023-07-04: qty 16, 16d supply, fill #0

## 2023-07-04 MED ORDER — PRAZOSIN HCL 5 MG PO CAPS
5.0000 mg | ORAL_CAPSULE | Freq: Every day | ORAL | Status: DC
  Filled 2023-07-04: qty 14

## 2023-07-04 MED ORDER — PRAZOSIN HCL 5 MG PO CAPS: 5.0000 mg | ORAL_CAPSULE | Freq: Every day | ORAL | Status: DC

## 2023-07-04 MED ORDER — PANTOPRAZOLE SODIUM 20 MG PO TBEC: 20.0000 mg | DELAYED_RELEASE_TABLET | Freq: Every day | ORAL | Status: DC

## 2023-07-04 MED ORDER — ATORVASTATIN CALCIUM 40 MG PO TABS: 40.0000 mg | ORAL_TABLET | Freq: Every day | ORAL | Status: DC

## 2023-07-04 NOTE — Progress Notes (Signed)
   07/03/23 2100  Psych Admission Type (Psych Patients Only)  Admission Status Voluntary  Psychosocial Assessment  Eye Contact Fair  Facial Expression Anxious  Affect Appropriate to circumstance  Speech Logical/coherent  Interaction Assertive  Motor Activity Slow  Appearance/Hygiene Unremarkable;In scrubs  Behavior Characteristics Cooperative  Mood Anxious  Thought Process  Coherency WDL  Content WDL  Delusions None reported or observed  Perception WDL  Hallucination None reported or observed  Judgment Impaired  Confusion None  Danger to Self  Current suicidal ideation? Denies  Self-Injurious Behavior No self-injurious ideation or behavior indicators observed or expressed   Agreement Not to Harm Self Yes  Description of Agreement verbal  Danger to Others  Danger to Others None reported or observed

## 2023-07-04 NOTE — Progress Notes (Signed)
 Pt discharged alert and oriented x 4, calm and cooperative. All belongings returned to pt. Discharge instructions reviewed with pt and pt verbalized understanding of discharge instructions.

## 2023-07-04 NOTE — Plan of Care (Signed)
   Problem: Education: Goal: Knowledge of [facility] Education information/materials will improve Outcome: Progressing Goal: Emotional status will improve Outcome: Progressing Goal: Mental status will improve Outcome: Progressing Goal: Verbalization of understanding the information provided will improve Outcome: Progressing

## 2023-07-04 NOTE — Group Note (Signed)
 Date:  07/04/2023 Time:  11:55 AM  Group Topic/Focus:  Goals Group:   The focus of this group is to help patients establish daily goals to achieve during treatment and discuss how the patient can incorporate goal setting into their daily lives to aide in recovery.    Participation Level:  Minimal  Participation Quality:  Appropriate and Attentive  Affect:  Appropriate  Cognitive:  Appropriate  Insight: Appropriate  Engagement in Group:  Engaged  Modes of Intervention:  Exploration  Additional Comments:  Pt participated in Goals Group. Pt stated his goal is to take the opportunity at a different style of life.  Kamaiyah Uselton 07/04/2023, 11:55 AM

## 2023-07-04 NOTE — Progress Notes (Signed)
   07/04/23 0825  Psych Admission Type (Psych Patients Only)  Admission Status Voluntary  Psychosocial Assessment  Patient Complaints Anxiety;Depression  Eye Contact Fair  Facial Expression Animated  Affect Appropriate to circumstance  Speech Logical/coherent  Interaction Assertive  Motor Activity Slow  Appearance/Hygiene Unremarkable  Behavior Characteristics Cooperative  Mood Anxious;Depressed  Thought Process  Coherency WDL  Content WDL  Delusions None reported or observed  Perception WDL  Hallucination None reported or observed  Judgment Poor  Confusion None  Danger to Self  Current suicidal ideation? Passive  Description of Suicide Plan no plan  Self-Injurious Behavior No self-injurious ideation or behavior indicators observed or expressed   Agreement Not to Harm Self Yes  Description of Agreement verblal  Danger to Others  Danger to Others None reported or observed

## 2023-07-04 NOTE — Progress Notes (Signed)
  [facility] Adult Case Management Discharge Plan :  Will you be returning to the same living situation after discharge:  No. At discharge, do you have transportation home?: Yes,  CSW arranged Safe Transport for 10:30-11:00AM to [facility], to arrive in [facility] at 1:00PM.  Do you have the ability to pay for your medications: Yes,  pt has active health insurance coverage  Release of information consent forms completed and in the chart;  Patient's signature needed at discharge.  Patient to Follow up at:  Follow-up Information     [facility] Follow up.   Why: You are established with this organization for CST. You will receive therapy and medication management when you discharge. You will receive a call from the office or [place] to connect for your appointment. Contact information: Address: [address] [place], [place]  Office #: [phone] [place] #: [phone] Fax #: [phone]        [facility] Follow up.   Why: You have been accepted to this facility for substance use treatment. You will arrive there on Tuesday 6/24 at 1:00PM. You will receive therapy and medication management while at this program. Contact information: Address: [address], [place], [place] [address]  Phone: [phone]                Next level of care provider has access to [facility] Link:no  Safety Planning and Suicide Prevention discussed: Yes,  information given to patient at discharge. Discussed briefly also with pt's brother Lowry.      Has patient been referred to the Quitline?: Patient does not use tobacco/nicotine products  Patient has been referred for addiction treatment: Pt admitting [facility] for substance use treatment in [place], [place]  Jenkins LULLA Primer, LCSWA 07/04/2023, 9:31 AM

## 2023-07-04 NOTE — Transportation (Signed)
 07/04/2023  Lynwood Colon Mulvaney DOB: Apr 27, 1960 MRN: [id number]   RIDER WAIVER AND RELEASE OF LIABILITY  For the purposes of helping with transportation needs, [facility] partners with outside transportation providers (taxi companies, [employer], Catering manager.) to give [facility] patients or other approved people the choice of on-demand rides Public librarian) to our buildings for non-emergency visits.  By using [employer], I, the person signing this document, on behalf of myself and/or any legal minors (in my care using the [employer]), agree:  Science writer given to me are supplied by independent, outside transportation providers who do not work for, or have any affiliation with, [employer]. [facility] is not a transportation company. [facility] has no control over the quality or safety of the rides I get using [employer]. [facility] has no control over whether any outside ride will happen on time or not. [facility] gives no guarantee on the reliability, quality, safety, or availability on any rides, or that no mistakes will happen. I know and accept that traveling by vehicle (car, truck, SVU, fleeta, bus, taxi, etc.) has risks of serious injuries such as disability, being paralyzed, and death. I know and agree the risk of using [employer] is mine alone, and not [employer]. [employer] are provided as is and as are available. The transportation providers are in charge for all inspections and care of the vehicles used to provide these rides. I agree not to take legal action against [facility], its agents, employees, officers, directors, representatives, insurers, attorneys, assigns, successors, subsidiaries, and affiliates at any time for any reasons related directly or indirectly to using [employer]. I also agree not to take legal action against [facility] or its affiliates for any injury, death, or damage to property caused by or related to using  [employer]. I have read this Waiver and Release of Liability, and I understand the terms used in it and their legal meaning. This Waiver is freely and voluntarily given with the understanding that my right (or any legal minors) to legal action against [place] relating to [employer] is knowingly given up to use these services.   I attest that I read the Ride Waiver and Release of Liability to Lynwood Curtistine Sharps, gave Mr. Fortin the opportunity to ask questions and answered the questions asked (if any). I affirm that Lynwood Curtistine Sharps then provided consent for assistance with transportation.

## 2023-07-05 NOTE — Discharge Summary (Signed)
 Physician Discharge Summary Note  Patient:  Markeith Colon Lundstrom is a 63 y.o. male  MRN:  [id number]  DOB:  09-09-1960  Patient phone: [phone] (home)  Patient address:   [facility] [address] [place] [address]   Total Time spent with patient: 79 Minutes  Date of Admission:  06/18/2023  Date of Discharge: 07/05/23   Reason for Admission: Major depressive disorder recurrent severe with suicidal ideation  Principal Problem: MDD (major depressive disorder), recurrent episode, severe (HCC)  Discharge Diagnoses: Principal Problem:   MDD (major depressive disorder), recurrent episode, severe (HCC) Active Problems:   PTSD (post-traumatic stress disorder)   Nightmares associated with chronic post-traumatic stress disorder     Past Psychiatric (and medical) History: Amiir Colon Trull  has a past medical history of Anxiety, Arthritis, Bipolar 1 disorder, depressed (HCC), Bipolar disorder (HCC), Depression, GERD (gastroesophageal reflux disease), Hepatitis, History of kidney stones, Hypertension, 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   Past Medical History:  Past Medical History:  Diagnosis Date   Anxiety    Arthritis    knees and hands   Bipolar 1 disorder, depressed (HCC)    Bipolar disorder (HCC)    Depression    GERD (gastroesophageal reflux disease)    Hepatitis    HEP C   History of kidney stones    Hypertension    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 echo 5/17: EF 60-65%, no RWMA, LV dias fxn nl, LA mildly dilated, RV sys fxn nl, PASP nl, moderate sized circumferential pericardial effusion was identified, 2.12 cm around the LV free wall, <1 cm around the RV free wall. Features were not c/w tamponade physiology   PTSD (post-traumatic  stress disorder)    Witnessed brother's suicide.   Restless leg syndrome    Seizures (HCC)    Syncope      Past Surgical History:  Procedure Laterality Date   APPENDECTOMY     CARDIOVERSION N/A 04/17/2023   Procedure: CARDIOVERSION;  Surgeon: Darliss Rogue, MD;  Location: [facility] ORS;  Service: Cardiovascular;  Laterality: N/A;   CYSTOSCOPY WITH URETEROSCOPY AND STENT PLACEMENT     ESOPHAGOGASTRODUODENOSCOPY N/A 01/11/2016   Procedure: ESOPHAGOGASTRODUODENOSCOPY (EGD);  Surgeon: Jerrell Sol, MD;  Location: [facility] ENDOSCOPY;  Service: Endoscopy;  Laterality: N/A;   ESOPHAGOGASTRODUODENOSCOPY N/A 04/09/2020   Procedure: ESOPHAGOGASTRODUODENOSCOPY (EGD);  Surgeon: Therisa Bi, MD;  Location: [facility] ENDOSCOPY;  Service: Gastroenterology;  Laterality: N/A;   INCISION AND DRAINAGE ABSCESS Left 01/02/2018   Procedure: INCISION AND DRAINAGE LEFT KNEE;  Surgeon: Cleotilde Barrio, MD;  Location: [facility] ORS;  Service: Orthopedics;  Laterality: Left;   JOINT REPLACEMENT Right    TKR   KNEE ARTHROSCOPY Right 06/25/2014   Procedure: ARTHROSCOPY KNEE;  Surgeon: Barrio Cleotilde, MD;  Location: [facility] ORS;  Service: Orthopedics;  Laterality: Right;  partial arthroscopic medial menisectomy   LAPAROSCOPIC APPENDECTOMY N/A 06/02/2021   Procedure: APPENDECTOMY LAPAROSCOPIC;  Surgeon: Lane Shope, MD;  Location: [facility] ORS;  Service: General;  Laterality: N/A;   TEE WITHOUT CARDIOVERSION N/A 04/17/2023   Procedure: ECHOCARDIOGRAM, TRANSESOPHAGEAL;  Surgeon: Darliss Rogue, MD;  Location: [facility] ORS;  Service: Cardiovascular;  Laterality: N/A;   TOTAL KNEE ARTHROPLASTY Right 04/22/2015   Procedure: TOTAL KNEE ARTHROPLASTY;  Surgeon: Barrio Cleotilde, MD;  Location: [facility] ORS;  Service: Orthopedics;  Laterality: Right;  TOTAL KNEE ARTHROPLASTY Left 10/30/2017   Procedure: TOTAL KNEE ARTHROPLASTY;  Surgeon: Cleotilde Barrio, MD;  Location: [facility] ORS;  Service: Orthopedics;  Laterality: Left;   TOTAL KNEE REVISION Left  01/02/2018   Procedure: poly exchange of tibia and patella left knee;  Surgeon: Cleotilde Barrio, MD;  Location: [facility] ORS;  Service: Orthopedics;  Laterality: Left;   UMBILICAL HERNIA REPAIR  06/02/2021   Procedure: HERNIA REPAIR UMBILICAL ADULT;  Surgeon: Lane Shope, MD;  Location: [facility] ORS;  Service: General;;     Family History:  Family History  Problem Relation Age of Onset   CVA Mother        deceased at age 64   Depression Brother        Died by suicide at age 59     Family Psychiatric  History: See history and physical  Social History:  Social History   Substance and Sexual Activity  Alcohol Use Not Currently   Comment: rare     Social History   Substance and Sexual Activity  Drug Use Not Currently   Types: Marijuana, Cocaine   Comment: last use January 2025, cocaine     Social History   Socioeconomic History   Marital status: Single    Spouse name: Not on file   Number of children: Not on file   Years of education: Not on file   Highest education level: Not on file  Occupational History   Not on file  Tobacco Use   Smoking status: Former    Current packs/day: 0.00    Average packs/day: 0.8 packs/day for 20.0 years (15.0 ttl pk-yrs)    Types: Cigarettes    Start date: 05/16/1964    Quit date: 05/16/1984    Years since quitting: 39.1   Smokeless tobacco: Never  Vaping Use   Vaping status: Never Used  Substance and Sexual Activity   Alcohol use: Not Currently    Comment: rare   Drug use: Not Currently    Types: Marijuana, Cocaine    Comment: last use January 2025, cocaine   Sexual activity: Not Currently  Other Topics Concern   Not on file  Social History Narrative   ** Merged History Encounter **       Social Drivers of Health   Financial Resource Strain: Not on file  Food Insecurity: Food Insecurity Present (06/18/2023)   Hunger Vital Sign    Worried About Running Out of Food in the Last Year: Often true    Ran Out of Food in the Last  Year: Often true  Transportation Needs: No Transportation Needs (06/18/2023)   PRAPARE - Administrator, Civil Service (Medical): No    Lack of Transportation (Non-Medical): No  Physical Activity: Not on file  Stress: Not on file  Social Connections: Socially Isolated (02/15/2023)   Social Connection and Isolation Panel    Frequency of Communication with Friends and Family: Once a week    Frequency of Social Gatherings with Friends and Family: Once a week    Attends Religious Services: Never    Database administrator or Organizations: No    Attends Banker Meetings: Never    Marital Status: Never married     Hospital Course:  During the patient's hospitalization, patient had extensive initial psychiatric evaluation, and follow-up psychiatric evaluations every day.  Psychiatric diagnoses provided upon initial assessment: MDD (major depressive disorder), recurrent episode, severe (HCC) [F33.2]   Patient's medications were adjusted and titrated to levels at  discharge.  Patient's care was discussed during the interdisciplinary team meeting every day during the hospitalization.  The patient denied having side effects to prescribed psychiatric medication.  Gradually, patient started adjusting to milieu. The patient was evaluated each day by a clinical provider to ascertain response to treatment. Improvement was noted by the patient's report of decreasing symptoms, improved sleep and appetite, affect, medication tolerance, behavior, and participation in unit programming.  Patient was asked each day to complete a self inventory noting mood, mental status, pain, new symptoms, anxiety and concerns.    Symptoms were reported as significantly decreased or resolved completely by discharge.   On day of discharge, the patient reports that their mood is stable. The patient denied having suicidal thoughts for more than 48 hours prior to discharge.  Patient denies having homicidal  thoughts.  Patient denies having auditory hallucinations.  Patient denies any visual hallucinations or other symptoms of psychosis. The patient was motivated to continue taking medication with a goal of continued improvement in mental health.   The patient reports their target psychiatric symptoms of depression and suicidal ideation responded well to the psychiatric medications and unit programming.  The patient reports overall benefit other psychiatric hospitalization. Supportive psychotherapy was provided to the patient. The patient also participated in regular group therapy while hospitalized. Coping skills, problem solving as well as relaxation therapies were also part of the unit programming.  Labs were reviewed with the patient, and abnormal results were discussed with the patient.  The patient is able to verbalize their individual safety plan to this provider.    Physical Findings:  AIMS:  Facial and Oral Movements: None Muscles of Facial Expression: None Lips and Perioral Area: None Jaw: None Tongue: None,Extremity Movements Upper (arms, wrists, hands, fingers): None Lower (legs, knees, ankles, toes): None, Trunk Movements Neck, shoulders, hips: None, Global Judgements Severity of abnormal movements overall: None Incapacitation due to abnormal movements: None Patient's awareness of abnormal movements: No Awareness, Dental Status Current problems with teeth and/or dentures: No Does patient usually wear dentures: No [place]: No   CIWA:   NA  COWS:  NA  Musculoskeletal: Strength & Muscle Tone: within normal limits Gait & Station: normal Patient leans: N/A    Psychiatric Specialty Exam:  Presentation  General Appearance: Appropriate for Environment  Eye Contact: Good  Speech: Clear and Coherent; Normal Rate  Speech Volume: Normal  Handedness: Right   Mood and Affect  Mood: Euthymic  Affect: Congruent   Thought Process  Thought Processes:  Linear  Descriptions of Associations: Intact  Orientation: Full (Time, Place and Person)  Thought Content: Logical  History of Schizophrenia/Schizoaffective disorder: No  Duration of Psychotic Symptoms: NA Hallucinations: No data recorded Ideas of Reference: None  Suicidal Thoughts: No data recorded Homicidal Thoughts: No data recorded  Sensorium  Memory: Immediate Good  Judgment: Fair  Insight: Fair   Art therapist  Concentration: Good  Attention Span: Good  Recall: Good  Fund of Knowledge: Good  Language: Good   Psychomotor Activity  Psychomotor Activity: No data recorded  Assets  Assets: Communication Skills; Social Support; Housing; Leisure Time   Sleep  Sleep: No data recorded     Physical Exam: General: Sitting comfortably. NAD. HEENT: Normocephalic, atraumatic, MMM, EMOI Lungs: no increased work of breathing noted Heart: no cyanosis Abdomen: Non distended Musculoskeletal: FROM. No obvious deformities Skin: Warm, dry, intact. No rashes noted Neuro: No obvious focal deficits.  Gait and station are normal  Review of Systems:  Constitutional: Negative.  HENT: Negative.    Eyes: Negative.   Respiratory: Negative.    Cardiovascular: Negative.   Gastrointestinal: Negative.   Genitourinary: Negative.   Skin: Negative.   Neurological: Negative.   Psychiatric/Behavioral:  Negative  Blood pressure 123/83, pulse 86, temperature (!) 97.5 F (36.4 C), temperature source Oral, resp. rate 12, height 5' 9 (1.753 m), weight 82.6 kg, SpO2 97%. Body mass index is 26.88 kg/m.    Social History   Tobacco Use  Smoking Status Former   Current packs/day: 0.00   Average packs/day: 0.8 packs/day for 20.0 years (15.0 ttl pk-yrs)   Types: Cigarettes   Start date: 05/16/1964   Quit date: 05/16/1984   Years since quitting: 39.1  Smokeless Tobacco Never     Tobacco Cessation:  A prescription for an FDA approved medication for tobacco cessation  was not prescribed because: Patient Refused   Blood Alcohol level:  Lab Results  Component Value Date   [facility] <15 05/29/2023   ETH <10 04/12/2023    Metabolic Disorder Labs:  Lab Results  Component Value Date   HGBA1C 4.9 06/01/2023   MPG 93.93 06/01/2023   MPG 111.15 12/29/2022   No results found for: PROLACTIN  Lab Results  Component Value Date   CHOL 246 (H) 06/01/2023   TRIG 129 06/01/2023   HDL 37 (L) 06/01/2023   VLDL 26 06/01/2023   LDLCALC 183 (H) 06/01/2023   LDLCALC 146 (H) 12/29/2022      See Psychiatric Specialty Exam and Suicide Risk Assessment completed by Attending Physician prior to discharge.  Discharge destination: Treatment program in [place]  Is patient on multiple antipsychotic therapies at discharge:  No  Has Patient had three or more failed trials of antipsychotic monotherapy by history: NA Recommended Plan for Multiple Antipsychotic Therapies: NA   Discharge Instructions     Diet - low sodium heart healthy   Complete by: As directed    Increase activity slowly   Complete by: As directed         Allergies as of 07/04/2023       Reactions   Ketorolac  Tromethamine  Hives, Rash   Other Reaction(s): Not available        Medication List     STOP taking these medications    escitalopram  20 MG tablet Commonly known as: LEXAPRO    feeding supplement Liqd   hydrOXYzine  25 MG tablet Commonly known as: ATARAX    lurasidone  20 MG Tabs tablet Commonly known as: LATUDA    melatonin 5 MG Tabs   metoprolol  succinate 25 MG 24 hr tablet Commonly known as: TOPROL -XL   multivitamin with minerals Tabs tablet   tamsulosin  0.4 MG Caps capsule Commonly known as: FLOMAX        TAKE these medications      Indication  amiodarone  200 MG tablet Commonly known as: PACERONE  Take 1 tablet (200 mg total) by mouth daily. What changed: additional instructions  Indication: Cardic issues.   ARIPiprazole  5 MG tablet Commonly known as:  ABILIFY  Take 1 tablet (5 mg total) by mouth at bedtime.  Indication: Manic Phase of Manic-Depression   atorvastatin  40 MG tablet Commonly known as: LIPITOR Take 1 tablet (40 mg total) by mouth daily. What changed:  medication strength how much to take additional instructions  Indication: High Amount of Fats in the Blood   divalproex  500 MG DR tablet Commonly known as: Depakote  Take 1 tablet (500 mg total) by mouth in the morning AND 2 tablets (1,000 mg total) at bedtime. What changed:  medication strength See the new instructions.  Indication: Depressive Phase of Manic-Depression   doxepin  100 MG capsule Commonly known as: SINEQUAN  Take 1 capsule (100 mg total) by mouth at bedtime. What changed:  medication strength how much to take additional instructions  Indication: Depression   pantoprazole  20 MG tablet Commonly known as: PROTONIX  Take 1 tablet (20 mg total) by mouth daily.  Indication: Indigestion   prazosin  5 MG capsule Commonly known as: MINIPRESS  Take 1 capsule (5 mg total) by mouth at bedtime. What changed:  medication strength how much to take additional instructions  Indication: Frightening Dreams   topiramate  50 MG tablet Commonly known as: TOPAMAX  Take 1 tablet (50 mg total) by mouth at bedtime.  Indication: Migraine Headache          Follow-up Information     [facility] Follow up.   Why: You are established with this organization for CST. You will receive therapy and medication management when you discharge. You will receive a call from the office or [place] to connect for your appointment. Contact information: Address: [address], [place], [place]  Office #: [phone] [place] #: [phone] Fax #: [phone]        [facility] Follow up.   Why: You have been accepted to this facility for substance use treatment. You will arrive there on Tuesday 6/24 at 1:00PM. You will receive therapy  and medication management while at this program. Contact information: Address: [address], [place], [place] [address]  Phone: [phone]                   Follow-up recommendations:  - It is recommended to the patient to continue psychiatric medications as prescribed, after discharge from the hospital.   - It is recommended to the patient to follow up with your outpatient psychiatric provider and PCP. - It was discussed with the patient, the impact of alcohol, drugs, tobacco have been there overall psychiatric and medical wellbeing, and total abstinence from substance use was recommended the patient. - Prescriptions provided or sent directly to preferred pharmacy at discharge. Patient agreeable to plan. Given opportunity to ask questions. Appears to feel comfortable with discharge.   - In the event of worsening symptoms, the patient is instructed to call the crisis hotline, 911 and or go to the nearest ED for appropriate evaluation and treatment of symptoms. To follow-up with primary care provider for other medical issues, concerns and or health care needs - Patient was discharged to tx program in [place] with a plan to follow up as noted above.   Comments:  NA  Signed: Glendia Kitty, MD 07/05/23 12:19 PM

## 2023-09-27 NOTE — Progress Notes (Deleted)
 New patient visit   Patient: [name]   DOB: Sep 23, 1960   63 y.o. Male  MRN: [id number] Visit Date: 09/28/2023  Today's healthcare provider: Isaiah DELENA Pepper, MD   No chief complaint on file.  Subjective    [name] is a 63 y.o. male who presents today as a new patient to establish care.   Discussed the use of AI scribe software for clinical note transcription with the patient, who gave verbal consent to proceed.  History of Present Illness             Past Medical History:  Diagnosis Date   Anxiety    Arthritis    knees and hands   Bipolar 1 disorder, depressed (HCC)    Bipolar disorder (HCC)    Depression    GERD (gastroesophageal reflux disease)    Hepatitis    HEP C   History of kidney stones    Hypertension    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 echo 5/17: EF 60-65%, no RWMA, LV dias fxn nl, LA mildly dilated, RV sys fxn nl, PASP nl, moderate sized circumferential pericardial effusion was identified, 2.12 cm around the LV free wall, <1 cm around the RV free wall. Features were not c/w tamponade physiology   PTSD (post-traumatic stress disorder)    Witnessed brother's suicide.   Restless leg syndrome    Seizures (HCC)    Syncope    Past Surgical History:  Procedure Laterality Date   APPENDECTOMY     CARDIOVERSION N/A 04/17/2023   Procedure: CARDIOVERSION;  Surgeon: Darliss Rogue, MD;  Location: [facility] ORS;  Service: Cardiovascular;  Laterality: N/A;   CYSTOSCOPY WITH URETEROSCOPY AND STENT PLACEMENT     ESOPHAGOGASTRODUODENOSCOPY N/A 01/11/2016   Procedure: ESOPHAGOGASTRODUODENOSCOPY (EGD);  Surgeon: Jerrell Sol, MD;  Location: [facility] ENDOSCOPY;  Service: Endoscopy;  Laterality: N/A;   ESOPHAGOGASTRODUODENOSCOPY N/A 04/09/2020   Procedure: ESOPHAGOGASTRODUODENOSCOPY (EGD);  Surgeon: Therisa Bi, MD;  Location: [facility] ENDOSCOPY;  Service: Gastroenterology;   Laterality: N/A;   INCISION AND DRAINAGE ABSCESS Left 01/02/2018   Procedure: INCISION AND DRAINAGE LEFT KNEE;  Surgeon: Cleotilde Barrio, MD;  Location: [facility] ORS;  Service: Orthopedics;  Laterality: Left;   JOINT REPLACEMENT Right    TKR   KNEE ARTHROSCOPY Right 06/25/2014   Procedure: ARTHROSCOPY KNEE;  Surgeon: Barrio Cleotilde, MD;  Location: [facility] ORS;  Service: Orthopedics;  Laterality: Right;  partial arthroscopic medial menisectomy   LAPAROSCOPIC APPENDECTOMY N/A 06/02/2021   Procedure: APPENDECTOMY LAPAROSCOPIC;  Surgeon: Lane Shope, MD;  Location: [facility] ORS;  Service: General;  Laterality: N/A;   TEE WITHOUT CARDIOVERSION N/A 04/17/2023   Procedure: ECHOCARDIOGRAM, TRANSESOPHAGEAL;  Surgeon: Darliss Rogue, MD;  Location: [facility] ORS;  Service: Cardiovascular;  Laterality: N/A;   TOTAL KNEE ARTHROPLASTY Right 04/22/2015   Procedure: TOTAL KNEE ARTHROPLASTY;  Surgeon: Barrio Cleotilde, MD;  Location: [facility] ORS;  Service: Orthopedics;  Laterality: Right;   TOTAL KNEE ARTHROPLASTY Left 10/30/2017   Procedure: TOTAL KNEE ARTHROPLASTY;  Surgeon: Cleotilde Barrio, MD;  Location: [facility] ORS;  Service: Orthopedics;  Laterality: Left;   TOTAL KNEE REVISION Left 01/02/2018   Procedure: poly exchange of tibia and patella left knee;  Surgeon: Cleotilde Barrio, MD;  Location: [facility] ORS;  Service: Orthopedics;  Laterality: Left;   UMBILICAL HERNIA REPAIR  06/02/2021   Procedure: HERNIA REPAIR UMBILICAL ADULT;  Surgeon: Lane Shope, MD;  Location: [facility] ORS;  Service: General;;  Family Status  Relation Name Status   Mother  Deceased   Father  Deceased   Brother  Deceased  No partnership data on file   Family History  Problem Relation Age of Onset   CVA Mother        deceased at age 70   Depression Brother        Died by suicide at age 55   Social History   Socioeconomic History   Marital status: Single    Spouse name: Not on file   Number of children: Not on file   Years of education: Not  on file   Highest education level: Not on file  Occupational History   Not on file  Tobacco Use   Smoking status: Former    Current packs/day: 0.00    Average packs/day: 0.8 packs/day for 20.0 years (15.0 ttl pk-yrs)    Types: Cigarettes    Start date: 05/16/1964    Quit date: 05/16/1984    Years since quitting: 39.3   Smokeless tobacco: Never  Vaping Use   Vaping status: Never Used  Substance and Sexual Activity   Alcohol use: Not Currently    Comment: rare   Drug use: Not Currently    Types: Marijuana, Cocaine    Comment: last use January 2025, cocaine   Sexual activity: Not Currently  Other Topics Concern   Not on file  Social History Narrative   ** Merged History Encounter **       Social Drivers of Health   Financial Resource Strain: Not on file  Food Insecurity: Food Insecurity Present (06/18/2023)   Hunger Vital Sign    Worried About Running Out of Food in the Last Year: Often true    Ran Out of Food in the Last Year: Often true  Transportation Needs: No Transportation Needs (06/18/2023)   PRAPARE - Administrator, Civil Service (Medical): No    Lack of Transportation (Non-Medical): No  Physical Activity: Not on file  Stress: Not on file  Social Connections: Socially Isolated (02/15/2023)   Social Connection and Isolation Panel    Frequency of Communication with Friends and Family: Once a week    Frequency of Social Gatherings with Friends and Family: Once a week    Attends Religious Services: Never    Database administrator or Organizations: No    Attends Banker Meetings: Never    Marital Status: Never married   Outpatient Medications Prior to Visit  Medication Sig   amiodarone  (PACERONE ) 200 MG tablet Take 1 tablet (200 mg total) by mouth daily.   ARIPiprazole  (ABILIFY ) 5 MG tablet Take 1 tablet (5 mg total) by mouth at bedtime.   atorvastatin  (LIPITOR) 40 MG tablet Take 1 tablet (40 mg total) by mouth daily.   divalproex  (DEPAKOTE ) 500  MG DR tablet Take 1 tablet (500 mg total) by mouth in the morning AND 2 tablets (1,000 mg total) at bedtime.   doxepin  (SINEQUAN ) 100 MG capsule Take 1 capsule (100 mg total) by mouth at bedtime.   pantoprazole  (PROTONIX ) 20 MG tablet Take 1 tablet (20 mg total) by mouth daily.   prazosin  (MINIPRESS ) 5 MG capsule Take 1 capsule (5 mg total) by mouth at bedtime.   topiramate  (TOPAMAX ) 50 MG tablet Take 1 tablet (50 mg total) by mouth at bedtime.   No facility-administered medications prior to visit.   Allergies  Allergen Reactions   Ketorolac  Tromethamine  Hives and Rash    Other  Reaction(s): Not available    Reviews of Systems as noted in HPI.  {Insert previous labs (optional):23779} {See past labs  Heme  Chem  Endocrine  Serology  Results Review (optional):1}   Objective    There were no vitals taken for this visit. {Insert last BP/Wt (optional):23777}{See vitals history (optional):1}   Physical Exam  Depression Screen     No data to display         No results found for any visits on 09/28/23.  Assessment & Plan      Problem List Items Addressed This Visit   None   Assessment and Plan              No follow-ups on file.      Isaiah DELENA Pepper, MD  [facility] [phone] (phone) [phone] (fax)

## 2023-09-28 ENCOUNTER — Ambulatory Visit: Payer: MEDICAID

## 2023-10-26 ENCOUNTER — Ambulatory Visit: Payer: Self-pay

## 2023-10-26 NOTE — Telephone Encounter (Signed)
   Copied from CRM [id number]. Topic: Clinical - Red Word Triage >> Oct 26, 2023  9:00 AM Olam RAMAN wrote: Red Word that prompted transfer to Nurse Triage: PT DECLINED TO SPEAK TO NURSE ASKED FOR AN APPT ONLY blood pressure up and down has not checked needs a bp RX ADDED TO WAIT LIST

## 2023-10-26 NOTE — Telephone Encounter (Signed)
 2nd attempt to contact patient-no answer but voicemail left to call back to Nurse Triage. Will place in call backs.

## 2023-10-26 NOTE — Telephone Encounter (Signed)
 3rd attempt to contact pt. No answer, LVM to return call to Nurse Triage as pt isn't established with office.

## 2023-12-01 ENCOUNTER — Ambulatory Visit (INDEPENDENT_AMBULATORY_CARE_PROVIDER_SITE_OTHER): Payer: MEDICAID

## 2023-12-01 VITALS — BP 123/91 | HR 107 | Resp 16 | Ht 69.0 in | Wt 188.2 lb

## 2023-12-01 DIAGNOSIS — R569 Unspecified convulsions: Secondary | ICD-10-CM

## 2023-12-01 DIAGNOSIS — F4310 Post-traumatic stress disorder, unspecified: Secondary | ICD-10-CM | POA: Diagnosis not present

## 2023-12-01 DIAGNOSIS — K219 Gastro-esophageal reflux disease without esophagitis: Secondary | ICD-10-CM

## 2023-12-01 DIAGNOSIS — I4819 Other persistent atrial fibrillation: Secondary | ICD-10-CM

## 2023-12-01 DIAGNOSIS — K739 Chronic hepatitis, unspecified: Secondary | ICD-10-CM

## 2023-12-01 DIAGNOSIS — G8929 Other chronic pain: Secondary | ICD-10-CM

## 2023-12-01 DIAGNOSIS — I10 Essential (primary) hypertension: Secondary | ICD-10-CM

## 2023-12-01 DIAGNOSIS — E785 Hyperlipidemia, unspecified: Secondary | ICD-10-CM

## 2023-12-01 DIAGNOSIS — M170 Bilateral primary osteoarthritis of knee: Secondary | ICD-10-CM

## 2023-12-01 DIAGNOSIS — Z131 Encounter for screening for diabetes mellitus: Secondary | ICD-10-CM

## 2023-12-01 DIAGNOSIS — Z136 Encounter for screening for cardiovascular disorders: Secondary | ICD-10-CM

## 2023-12-01 DIAGNOSIS — F4312 Post-traumatic stress disorder, chronic: Secondary | ICD-10-CM

## 2023-12-01 DIAGNOSIS — I480 Paroxysmal atrial fibrillation: Secondary | ICD-10-CM

## 2023-12-01 DIAGNOSIS — F331 Major depressive disorder, recurrent, moderate: Secondary | ICD-10-CM | POA: Diagnosis not present

## 2023-12-01 DIAGNOSIS — G43709 Chronic migraine without aura, not intractable, without status migrainosus: Secondary | ICD-10-CM

## 2023-12-01 DIAGNOSIS — R000 Tachycardia, unspecified: Secondary | ICD-10-CM

## 2023-12-01 MED ORDER — TOPIRAMATE 50 MG PO TABS: 50.0000 mg | ORAL_TABLET | Freq: Every day | ORAL | 3 refills | Status: DC

## 2023-12-01 MED ORDER — ARIPIPRAZOLE 5 MG PO TABS: 5.0000 mg | ORAL_TABLET | Freq: Every day | ORAL | 3 refills | Status: DC

## 2023-12-01 MED ORDER — METOPROLOL SUCCINATE ER 25 MG PO TB24: 25.0000 mg | ORAL_TABLET | Freq: Every day | ORAL | 3 refills | Status: AC

## 2023-12-01 MED ORDER — ATORVASTATIN CALCIUM 40 MG PO TABS: 40.0000 mg | ORAL_TABLET | Freq: Every day | ORAL | 3 refills | Status: AC

## 2023-12-01 MED ORDER — DOXEPIN HCL 100 MG PO CAPS: 100.0000 mg | ORAL_CAPSULE | Freq: Every day | ORAL | 3 refills | Status: DC

## 2023-12-01 MED ORDER — PREDNISONE 10 MG PO TABS: ORAL_TABLET | ORAL | 0 refills | Status: AC

## 2023-12-01 NOTE — Progress Notes (Unsigned)
 New patient visit   Patient: [name]   DOB: 1960/07/07   63 y.o. Male  MRN: [id number] Visit Date: 12/01/2023  Today's healthcare provider: Isaiah DELENA Pepper, MD   Chief Complaint  Patient presents with   New Patient (Initial Visit)    NP/Knee pain( Lt)/ BP   Subjective    [name] is a 63 y.o. male who presents today as a new patient to establish care.   Discussed the use of AI scribe software for clinical note transcription with the patient, who gave verbal consent to proceed.  History of Present Illness [name] is a 63 year old male with chronic knee pain who presents with worsening knee pain and difficulty standing.  He has chronic knee pain, particularly in his L knee that has undergone surgery. The pain has worsened, making it difficult for him to stand and work. He has been evaluated by [facility], where imaging was performed and the patient was told that 'they look fine.' He has previously been on prednisone , which provided temporary relief, but the pain returned. He is currently not taking any pain medication other than occasional Tylenol . He is wondering if tramadol  would be helpful.  He has a history of atrial fibrillation and was previously on metoprolol , which was discontinued due to low blood pressure. He experiences episodes of rapid heart rate and has not seen a cardiologist since his last hospital discharge. He is not currently on any heart medications but is concerned about his heart rhythm.  He has a history of seizures and was previously on Depakote , which he takes occasionally when he feels a seizure coming on. He has not had a major seizure in months and has not been formally diagnosed with a seizure disorder. He also has a history of bipolar disorder and has been on various psychiatric medications, including doxepin  for sleep and Abilify  for mood stabilization.  He reports frequent headaches, described as a dull, constant ache,  for which he takes Tylenol  daily. He was previously on topiramate , which he found effective for headache relief, but has not been on it recently.  Socially, he has experienced significant life changes, including homelessness and the loss of his long-term partner to a nursing home due to dementia. He now lives with his brother, who has been supportive. He has a history of substance use, including alcohol and cocaine, but reports abstaining from these substances recently. He is on probation and has been avoiding substances.  He has a history of hepatitis C, which he was told resolved spontaneously without treatment.    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 echo 5/17: EF 60-65%, no RWMA, LV dias fxn nl, LA mildly dilated, RV sys fxn nl, PASP nl, moderate sized circumferential pericardial effusion was identified, 2.12 cm around the LV free wall, <1 cm around the RV free wall. Features were not c/w tamponade physiology   PTSD (post-traumatic stress disorder)    Witnessed brother's suicide.   Restless leg syndrome    Seizures (HCC)  Syncope    Past Surgical History:  Procedure Laterality Date   APPENDECTOMY     CARDIOVERSION N/A 04/17/2023   Procedure: CARDIOVERSION;  Surgeon: Darliss Rogue, MD;  Location: [facility] ORS;  Service: Cardiovascular;  Laterality: N/A;   CYSTOSCOPY WITH URETEROSCOPY AND STENT PLACEMENT     ESOPHAGOGASTRODUODENOSCOPY N/A 01/11/2016   Procedure: ESOPHAGOGASTRODUODENOSCOPY (EGD);  Surgeon: Jerrell Sol, MD;  Location[facility] ENDOSCOPY;  Service: Endoscopy;  Laterality: N/A;   ESOPHAGOGASTRODUODENOSCOPY N/A 04/09/2020   Procedure:  ESOPHAGOGASTRODUODENOSCOPY (EGD);  Surgeon: Therisa Bi, MD;  Location[facility] ENDOSCOPY;  Service: Gastroenterology;  Laterality: N/A;   INCISION AND DRAINAGE ABSCESS Left 01/02/2018   Procedure: INCISION AND DRAINAGE LEFT KNEE;  Surgeon: Cleotilde Barrio, MD;  Location: [facility] ORS;  Service: Orthopedics;  Laterality: Left;   JOINT REPLACEMENT Right    TKR   KNEE ARTHROSCOPY Right 06/25/2014   Procedure: ARTHROSCOPY KNEE;  Surgeon: Barrio Cleotilde, MD;  Location: [facility] ORS;  Service: Orthopedics;  Laterality: Right;  partial arthroscopic medial menisectomy   LAPAROSCOPIC APPENDECTOMY N/A 06/02/2021   Procedure: APPENDECTOMY LAPAROSCOPIC;  Surgeon: Lane Shope, MD;  Location: [facility] ORS;  Service: General;  Laterality: N/A;   TEE WITHOUT CARDIOVERSION N/A 04/17/2023   Procedure: ECHOCARDIOGRAM, TRANSESOPHAGEAL;  Surgeon: Darliss Rogue, MD;  Location: [facility] ORS;  Service: Cardiovascular;  Laterality: N/A;   TOTAL KNEE ARTHROPLASTY Right 04/22/2015   Procedure: TOTAL KNEE ARTHROPLASTY;  Surgeon: Barrio Cleotilde, MD;  Location: [facility] ORS;  Service: Orthopedics;  Laterality: Right;   TOTAL KNEE ARTHROPLASTY Left 10/30/2017   Procedure: TOTAL KNEE ARTHROPLASTY;  Surgeon: Cleotilde Barrio, MD;  Location: [facility] ORS;  Service: Orthopedics;  Laterality: Left;   TOTAL KNEE REVISION Left 01/02/2018   Procedure: poly exchange of tibia and patella left knee;  Surgeon: Cleotilde Barrio, MD;  Location: [facility] ORS;  Service: Orthopedics;  Laterality: Left;   UMBILICAL HERNIA REPAIR  06/02/2021   Procedure: HERNIA REPAIR UMBILICAL ADULT;  Surgeon: Lane Shope, MD;  Location: [facility] ORS;  Service: General;;   Family Status  Relation Name Status   Mother  Deceased   Father  Deceased   Brother  Deceased  No partnership data on file   Family History  Problem Relation Age of Onset   CVA Mother        deceased at age 43   Depression Brother        Died by suicide at age 71   Social History   Socioeconomic History    Marital status: Single    Spouse name: Not on file   Number of children: Not on file   Years of education: Not on file   Highest education level: Not on file  Occupational History   Not on file  Tobacco Use   Smoking status: Former    Current packs/day: 0.00    Average packs/day: 0.8 packs/day for 20.0 years (15.0 ttl pk-yrs)    Types: Cigarettes    Start date: 05/16/1964    Quit date: 05/16/1984    Years since quitting: 39.5   Smokeless tobacco: Never  Vaping Use   Vaping status: Never Used  Substance and Sexual Activity   Alcohol use: Not Currently    Comment: rare   Drug use: Not Currently    Types: Marijuana, Cocaine    Comment: last use January 2025, cocaine   Sexual activity: Not Currently  Other Topics Concern   Not on file  Social History Narrative   ** Merged History Encounter **       Social  Drivers of Corporate Investment Banker Strain: Not on file  Food Insecurity: Food Insecurity Present (06/18/2023)   Hunger Vital Sign    Worried About Running Out of Food in the Last Year: Often true    Ran Out of Food in the Last Year: Often true  Transportation Needs: No Transportation Needs (06/18/2023)   PRAPARE - Administrator, Civil Service (Medical): No    Lack of Transportation (Non-Medical): No  Physical Activity: Not on file  Stress: Not on file  Social Connections: Socially Isolated (02/15/2023)   Social Connection and Isolation Panel    Frequency of Communication with Friends and Family: Once a week    Frequency of Social Gatherings with Friends and Family: Once a week    Attends Religious Services: Never    Database Administrator or Organizations: No    Attends Banker Meetings: Never    Marital Status: Never married   Outpatient Medications Prior to Visit  Medication Sig   [DISCONTINUED] amiodarone  (PACERONE ) 200 MG tablet Take 1 tablet (200 mg total) by mouth daily. (Patient not taking: Reported on 12/01/2023)   [DISCONTINUED]  ARIPiprazole  (ABILIFY ) 5 MG tablet Take 1 tablet (5 mg total) by mouth at bedtime. (Patient not taking: Reported on 12/01/2023)   [DISCONTINUED] atorvastatin  (LIPITOR) 40 MG tablet Take 1 tablet (40 mg total) by mouth daily. (Patient not taking: Reported on 12/01/2023)   [DISCONTINUED] divalproex  (DEPAKOTE ) 500 MG DR tablet Take 1 tablet (500 mg total) by mouth in the morning AND 2 tablets (1,000 mg total) at bedtime. (Patient not taking: No sig reported)   [DISCONTINUED] doxepin  (SINEQUAN ) 100 MG capsule Take 1 capsule (100 mg total) by mouth at bedtime. (Patient not taking: Reported on 12/01/2023)   [DISCONTINUED] pantoprazole  (PROTONIX ) 20 MG tablet Take 1 tablet (20 mg total) by mouth daily. (Patient not taking: Reported on 12/01/2023)   [DISCONTINUED] prazosin  (MINIPRESS ) 5 MG capsule Take 1 capsule (5 mg total) by mouth at bedtime. (Patient not taking: Reported on 12/01/2023)   [DISCONTINUED] topiramate  (TOPAMAX ) 50 MG tablet Take 1 tablet (50 mg total) by mouth at bedtime. (Patient not taking: Reported on 12/01/2023)   No facility-administered medications prior to visit.   Allergies  Allergen Reactions   Ketorolac  Tromethamine  Hives and Rash    Other Reaction(s): Not available    Reviews of Systems as noted in HPI.      Objective    BP (!) 123/91 (BP Location: Right Arm, Patient Position: Sitting)   Pulse (!) 107   Resp 16   Ht 5' 9 (1.753 m)   Wt 188 lb 3.2 oz (85.4 kg)   SpO2 100%   BMI 27.79 kg/m     Physical Exam Constitutional:      Appearance: Normal appearance.  HENT:     Head: Normocephalic and atraumatic.     Mouth/Throat:     Mouth: Mucous membranes are moist.  Eyes:     Pupils: Pupils are equal, round, and reactive to light.  Cardiovascular:     Rate and Rhythm: Regular rhythm. Tachycardia present.  Pulmonary:     Effort: Pulmonary effort is normal.     Breath sounds: Normal breath sounds.  Skin:    General: Skin is warm.  Neurological:      General: No focal deficit present.     Mental Status: He is alert.     Depression Screen    12/01/2023    1:34 PM  PHQ 2/9 Scores  PHQ - 2 Score 0  PHQ- 9 Score 0   Results for orders placed or performed in visit on 12/01/23  Comprehensive metabolic panel with GFR  Result Value Ref Range   Glucose 91 70 - 99 mg/dL   BUN 15 8 - 27 mg/dL   Creatinine, Ser 8.89 0.76 - 1.27 mg/dL   eGFR 76 >40 fO/fpw/8.26   BUN/Creatinine Ratio 14 10 - 24   Sodium 142 134 - 144 mmol/L   Potassium 4.8 3.5 - 5.2 mmol/L   Chloride 104 96 - 106 mmol/L   CO2 18 (L) 20 - 29 mmol/L   Calcium  9.9 8.6 - 10.2 mg/dL   Total Protein 7.1 6.0 - 8.5 g/dL   Albumin 4.6 3.9 - 4.9 g/dL   Globulin, Total 2.5 1.5 - 4.5 g/dL   Bilirubin Total 0.9 0.0 - 1.2 mg/dL   Alkaline Phosphatase 89 47 - 123 IU/L   AST 13 0 - 40 IU/L   ALT 63 (H) 0 - 44 IU/L  TSH  Result Value Ref Range   TSH 2.170 0.450 - 4.500 uIU/mL  Magnesium   Result Value Ref Range   Magnesium  2.5 (H) 1.6 - 2.3 mg/dL  CBC  Result Value Ref Range   WBC 7.8 3.4 - 10.8 x10E3/uL   RBC 4.90 4.14 - 5.80 x10E6/uL   Hemoglobin 14.7 13.0 - 17.7 g/dL   Hematocrit 54.8 62.4 - 51.0 %   MCV 92 79 - 97 fL   MCH 30.0 26.6 - 33.0 pg   MCHC 32.6 31.5 - 35.7 g/dL   RDW 85.1 88.3 - 84.5 %   Platelets 239 150 - 450 x10E3/uL  Hemoglobin A1c  Result Value Ref Range   Hgb A1c MFr Bld 5.7 (H) 4.8 - 5.6 %   Est. average glucose Bld gHb Est-mCnc 117 mg/dL  Lipid panel  Result Value Ref Range   Cholesterol, Total 193 100 - 199 mg/dL   Triglycerides 846 (H) 0 - 149 mg/dL   HDL 48 >60 mg/dL   VLDL Cholesterol Cal 27 5 - 40 mg/dL   LDL Chol Calc (NIH) 881 (H) 0 - 99 mg/dL   Chol/HDL Ratio 4.0 0.0 - 5.0 ratio    Assessment & Plan      Problem List Items Addressed This Visit       Cardiovascular and Mediastinum   Primary hypertension - Primary   Relevant Medications   atorvastatin  (LIPITOR) 40 MG tablet   metoprolol  succinate (TOPROL -XL) 25 MG 24 hr tablet    Paroxysmal atrial fibrillation (HCC)   Relevant Medications   atorvastatin  (LIPITOR) 40 MG tablet   metoprolol  succinate (TOPROL -XL) 25 MG 24 hr tablet   Other Relevant Orders   Ambulatory referral to Cardiology   Chronic migraine without aura without status migrainosus, not intractable   Relevant Medications   topiramate  (TOPAMAX ) 50 MG tablet   doxepin  (SINEQUAN ) 100 MG capsule   atorvastatin  (LIPITOR) 40 MG tablet   metoprolol  succinate (TOPROL -XL) 25 MG 24 hr tablet   predniSONE  (DELTASONE ) 10 MG tablet     Digestive   Chronic hepatitis (HCC)   Relevant Orders   Hepatitis C antibody   HCV RNA quant     Musculoskeletal and Integument   Osteoarthritis of both knees   Relevant Medications   predniSONE  (DELTASONE ) 10 MG tablet     Other   Moderate episode of recurrent major depressive disorder (HCC)   Relevant Medications   doxepin  (SINEQUAN ) 100 MG capsule   ARIPiprazole  (ABILIFY )  5 MG tablet   Other Relevant Orders   AMB Referral VBCI Care Management   PTSD (post-traumatic stress disorder)   Relevant Medications   doxepin  (SINEQUAN ) 100 MG capsule   Other Relevant Orders   AMB Referral VBCI Care Management   Seizures (HCC)   Relevant Medications   topiramate  (TOPAMAX ) 50 MG tablet   Other Relevant Orders   Ambulatory referral to Neurology   Sinus tachycardia   Relevant Medications   metoprolol  succinate (TOPROL -XL) 25 MG 24 hr tablet   Other Relevant Orders   Comprehensive metabolic panel with GFR (Completed)   TSH (Completed)   Magnesium  (Completed)   CBC (Completed)   Ambulatory referral to Cardiology   Other Visit Diagnoses       Hyperlipidemia, unspecified hyperlipidemia type       Relevant Medications   atorvastatin  (LIPITOR) 40 MG tablet   metoprolol  succinate (TOPROL -XL) 25 MG 24 hr tablet     Screening for cardiovascular condition       Relevant Orders   Lipid panel (Completed)     Screening for diabetes mellitus (DM)       Relevant Orders    Hemoglobin A1c (Completed)      Assessment & Plan Osteoarthritis of bilateral knees Chronic pain of both knees with prior surgeries. Recently seen by orthopedics. Orthopedic consultation suggested revision surgery, but he is hesitant. - Prescribed 10-day prednisone  course given acute pain. - Avoid opiates given hx of SUD - Advised follow-up with [facility].  Essential hypertension Chronic, mildly elevated today. Previously took metoprolol . Per chart review, he was switched from metoprolol  to amiodarone  for atrial fibrillation. Reassuringly, patient not in A-fib today although does have sinus tachycardia. - Restarted metoprolol  25 mg daily. - Follow up in 1 month  Sinus tachycardia Paroxysmal Atrial Fibrillation (HCC) Sinus tachycardia noted on exam today. Previously on amiodarone  for A fib but has not been taking. Will restart metoprolol  as above. - Referred to cardiology for further evaluation.  Seizures (HCC) Migraines Patient with unknown seizure disorder. Previously was on Depakote , however has not been compliant with medication regimen. Denies recent seizures. Chronic daily headaches. Previous topiramate  use was effective for headaches and seizure prevention - Restarted topiramate  - Referred to neurology for evaluation.  Depression PTSD Insomnia Chronic, controlled at this time despite not taking any medication. Hx of SI, however currently denies. - Restart doxepin  and Abilify  at night. - Encouraged psychiatric evaluation, patient has appointment [facility]  Hyperlipidemia Elevated cholesterol levels. Has not been taking statin. - Restart statin as previously prescribed.  Chronic hepatitis C (HCC) Patient with hx of positive hepatitis C antibody. Was told that this resolved spontaneously on recent labs, however results not available to review. Will recheck hepatitis C antibody and viral load today. - Obtain hepatitis C labs   Return in about 4 weeks (around  12/29/2023) for Follow Up.      Isaiah DELENA Pepper, MD  [facility] [phone] (phone) [phone] (fax)

## 2023-12-02 LAB — LIPID PANEL
Chol/HDL Ratio: 4 ratio (ref 0.0–5.0)
Cholesterol, Total: 193 mg/dL (ref 100–199)
HDL: 48 mg/dL (ref >39)
LDL Chol Calc (NIH): 118 mg/dL — ABNORMAL HIGH (ref 0–99)
Triglycerides: 153 mg/dL — ABNORMAL HIGH (ref 0–149)
VLDL Cholesterol Cal: 27 mg/dL (ref 5–40)

## 2023-12-02 LAB — CBC
Hematocrit: 45.1 % (ref 37.5–51.0)
Hemoglobin: 14.7 g/dL (ref 13.0–17.7)
MCH: 30 pg (ref 26.6–33.0)
MCHC: 32.6 g/dL (ref 31.5–35.7)
MCV: 92 fL (ref 79–97)
Platelets: 239 x10E3/uL (ref 150–450)
RBC: 4.9 x10E6/uL (ref 4.14–5.80)
RDW: 14.8 % (ref 11.6–15.4)
WBC: 7.8 x10E3/uL (ref 3.4–10.8)

## 2023-12-02 LAB — HEMOGLOBIN A1C
Est. average glucose Bld gHb Est-mCnc: 117 mg/dL
Hgb A1c MFr Bld: 5.7 % — ABNORMAL HIGH (ref 4.8–5.6)

## 2023-12-02 LAB — COMPREHENSIVE METABOLIC PANEL WITH GFR
ALT: 63 IU/L — ABNORMAL HIGH (ref 0–44)
AST: 13 IU/L (ref 0–40)
Albumin: 4.6 g/dL (ref 3.9–4.9)
Alkaline Phosphatase: 89 IU/L (ref 47–123)
BUN/Creatinine Ratio: 14 (ref 10–24)
BUN: 15 mg/dL (ref 8–27)
Bilirubin Total: 0.9 mg/dL (ref 0.0–1.2)
CO2: 18 mmol/L — ABNORMAL LOW (ref 20–29)
Calcium: 9.9 mg/dL (ref 8.6–10.2)
Chloride: 104 mmol/L (ref 96–106)
Creatinine, Ser: 1.1 mg/dL (ref 0.76–1.27)
Globulin, Total: 2.5 g/dL (ref 1.5–4.5)
Glucose: 91 mg/dL (ref 70–99)
Potassium: 4.8 mmol/L (ref 3.5–5.2)
Sodium: 142 mmol/L (ref 134–144)
Total Protein: 7.1 g/dL (ref 6.0–8.5)
eGFR: 76 mL/min/1.73 (ref >59)

## 2023-12-02 LAB — MAGNESIUM: Magnesium: 2.5 mg/dL — ABNORMAL HIGH (ref 1.6–2.3)

## 2023-12-02 LAB — TSH: TSH: 2.17 u[IU]/mL (ref 0.450–4.500)

## 2023-12-03 ENCOUNTER — Ambulatory Visit: Payer: Self-pay

## 2023-12-03 DIAGNOSIS — M170 Bilateral primary osteoarthritis of knee: Secondary | ICD-10-CM | POA: Insufficient documentation

## 2023-12-03 DIAGNOSIS — K739 Chronic hepatitis, unspecified: Secondary | ICD-10-CM | POA: Insufficient documentation

## 2023-12-03 DIAGNOSIS — K219 Gastro-esophageal reflux disease without esophagitis: Secondary | ICD-10-CM | POA: Insufficient documentation

## 2023-12-03 DIAGNOSIS — I480 Paroxysmal atrial fibrillation: Secondary | ICD-10-CM | POA: Insufficient documentation

## 2023-12-03 DIAGNOSIS — G43709 Chronic migraine without aura, not intractable, without status migrainosus: Secondary | ICD-10-CM | POA: Insufficient documentation

## 2023-12-06 NOTE — Progress Notes (Deleted)
  Cardiology Office Note   Date:  12/06/2023  ID:  [name] [birth year], MRN [id number] PCP: Franchot Isaiah LABOR, MD  [facility] Providers Cardiologist:  Lonni Hanson, MD Electrophysiologist:  OLE ONEIDA HOLTS, MD { Click to update primary MD,subspecialty MD or APP then REFRESH:1}    History of Present Illness [name] is a 63 y.o. male PMH paroxysmal atrial fibrillation (not on AC due to CHA2DS2-VASc score of 1), HTN who presents for further evaluation management of palpitations.  Patient has previously seen Dr. Holts; last visit 04/2023.  Reportedly self discontinued amiodarone .  Saw PCP 12/01/2023, complaining of frequent palpitations.  Last LDL 118 11/2023.  Relevant CVD History -CT chest 04/2023 severe multivessel CAC and aortic atherosclerosis -TEE DCCV 04/2023 normal biventricular function, no thrombus, trivial MR and AI.  Successful DCCV x 1. - TTE 12/2022 normal biventricular function, moderate LVH, mild to moderate MR   ROS: Pt denies any chest discomfort, jaw pain, arm pain, palpitations, syncope, presyncope, orthopnea, PND, or LE edema.  Studies Reviewed I have independently reviewed the patient's ECG, previous CT scan, previous cardiac testing, previous medical records, previous blood work.  Physical Exam VS:  There were no vitals taken for this visit.       Wt Readings from Last 3 Encounters:  12/01/23 188 lb 3.2 oz (85.4 kg)  06/16/23 192 lb 0.3 oz (87.1 kg)  05/29/23 171 lb (77.6 kg)    GEN: No acute distress. NECK: No JVD; No carotid bruits. CARDIAC: ***RRR, no murmurs, rubs, gallops. RESPIRATORY:  Clear to auscultation. EXTREMITIES:  Warm and well-perfused. No edema.  ASSESSMENT AND PLAN Paroxysmal atrial fibrillation Paroxysmal tachycardia CAC Aortic atherosclerosis HLD        {Are you ordering a CV Procedure (e.g. stress test, cath, DCCV, TEE, etc)?   Press F2        :[id number]}  Dispo: ***  Signed, Caron Poser, MD

## 2023-12-12 ENCOUNTER — Ambulatory Visit: Payer: MEDICAID

## 2023-12-14 ENCOUNTER — Telehealth: Payer: Self-pay

## 2023-12-14 NOTE — Progress Notes (Signed)
 Complex Care Management Note  Care Guide Note 12/14/2023 Name: [name] MRN: [id number] DOB: Dec 24, 1960  [name] is a 63 y.o. year old male who sees Franchot Isaiah LABOR, MD for primary care. I reached out to [employer] by phone today to offer complex care management services.  [name] was given information about Complex Care Management services today including:   The Complex Care Management services include support from the care team which includes your Nurse Care Manager, Clinical Social Worker, or Pharmacist.  The Complex Care Management team is here to help remove barriers to the health concerns and goals most important to you. Complex Care Management services are voluntary, and the patient may decline or stop services at any time by request to their care team member.   Complex Care Management Consent Status: Patient agreed to services and verbal consent obtained.   Follow up plan:  Telephone appointment with complex care management team member scheduled for:  01/05/24 @ 10 AM.  Encounter Outcome:  Patient Scheduled  Leotis Rase [facility], [facility] Guide  Direct Dial: [phone]  Fax [phone]

## 2023-12-19 ENCOUNTER — Encounter: Payer: Self-pay | Admitting: Neurology

## 2024-01-05 ENCOUNTER — Telehealth: Payer: MEDICAID | Admitting: *Deleted

## 2024-01-10 ENCOUNTER — Telehealth: Payer: MEDICAID | Admitting: *Deleted

## 2024-01-10 ENCOUNTER — Encounter: Payer: Self-pay | Admitting: *Deleted

## 2024-01-16 ENCOUNTER — Telehealth: Payer: Self-pay

## 2024-01-16 NOTE — Progress Notes (Unsigned)
 Complex Care Management Note Care Guide Note  01/16/2024 Name: [name] MRN: [id number] DOB: [birth year]   Complex Care Management Outreach Attempts: An unsuccessful telephone outreach was attempted today to offer the patient information about available complex care management services.  Follow Up Plan:  Additional outreach attempts will be made to offer the patient complex care management information and services.   Encounter Outcome:  No Answer-Left voicemail  Leotis Rase [facility], [facility] Guide  Direct Dial: [phone]  Fax [phone]

## 2024-01-17 NOTE — Progress Notes (Signed)
 Complex Care Management Note  Care Guide Note 01/17/2024 Name: [name] MRN: [id number] DOB: Jun 02, 1960  [name] is a 64 y.o. year old male who sees Franchot Isaiah LABOR, MD for primary care. I reached out to [employer] by phone today to offer complex care management services.  [name] was given information about Complex Care Management services today including:   The Complex Care Management services include support from the care team which includes your Nurse Care Manager, Clinical Social Worker, or Pharmacist.  The Complex Care Management team is here to help remove barriers to the health concerns and goals most important to you. Complex Care Management services are voluntary, and the patient may decline or stop services at any time by request to their care team member.   Complex Care Management Consent Status: Patient agreed to services and verbal consent obtained.   Follow up plan:  Telephone appointment with complex care management team member scheduled for:  01/18/24 @ 3 PM  Encounter Outcome:  Patient Scheduled  Leotis Rase [facility], [facility] Guide  Direct Dial: [phone]  Fax [phone]

## 2024-01-18 ENCOUNTER — Other Ambulatory Visit: Payer: MEDICAID

## 2024-01-18 NOTE — Patient Instructions (Signed)
 Visit Information  Thank you for taking time to visit with me today.   Tailored Plan Medicaid On July 11, 2022 some people on [place] Medicaid will move to a new kind of Medicaid health plan called a Tailored Plan. Tailored Plans cover your doctor visits, prescription drugs, and health care services.    If your [place] Medicaid will move to a Tailored Plan, you should have gotten a letter and welcome packet. If you're not sure, call your [place] Medicaid Enrollment Broker at [phone] and ask.  Check out these free materials, in [place] and [place], to learn more about your Tailored Plan: [email or web address]/Tailored-Plans/Toolkit  Tailored Care Management Services  TCM services are available to you now. If you are a Tailored Plan member or will be and want information about Tailored Care Management Services including rides to appointments and community and home services, call the Care Management provider for your county of residence:    [facility] [place], [place])  Member Services: 6472281273 Behavioral Health Crisis Line: 785-757-7564, [place], [place], [place], [place])  Member Services: 863-175-7311 Behavioral Health Crisis Line: (863)015-6634     Please call the Suicide and Crisis Lifeline: 988 call the [place]  National Suicide Prevention Lifeline: (310) 788-8290 or TTY: 631-237-0372 TTY 419-630-8926) to talk to a trained counselor call 1-800-273-TALK (toll free, 24 hour hotline) call 911 if you are experiencing a Mental Health or Behavioral Health Crisis or need someone to talk to.  Care plan and visit instructions communicated with the patient verbally today. The patient DECLINED to receive copy of care plan and patient instructions in any format.   Thersia Hoar, HEDWIG, MHA [place]  [facility] Social Worker, Population Health [phone]

## 2024-01-18 NOTE — Patient Outreach (Signed)
 Referral received from Franchot Isaiah LABOR, MD for assistance with care management needs. This patient is enrolled in (name of health plan) and has associated care management benefits. I reached out to the health plan today to refer the patient to the assigned health plan care management team member.   Interventions:  Jazmine Colon Wenger was advised to anticipate contact from the health plan for follow up about care management services and resources.  SW mailed resources for food in [place].  Thersia Hoar, HEDWIG, MHA [place]  [facility] Social Worker, Population Health [phone]

## 2024-01-23 ENCOUNTER — Telehealth: Payer: Self-pay

## 2024-01-23 NOTE — Telephone Encounter (Unsigned)
 Copied from CRM #[id number]. Topic: Clinical - Request for Lab/Test Order >> Jan 23, 2024 10:30 AM Avram MATSU wrote: Reason for CRM: patient is requested a standard STD blood test please advise [phone]

## 2024-01-24 NOTE — Telephone Encounter (Signed)
 Need appointment

## 2024-01-25 ENCOUNTER — Ambulatory Visit: Payer: MEDICAID

## 2024-01-25 NOTE — Progress Notes (Unsigned)
" °  Cardiology Office Note   Date:  01/25/2024  ID:  [name], [name] 1960/12/26, MRN [id number] PCP: [name] LABOR, MD  [name] Providers Cardiologist:  Victor Hanson, MD Electrophysiologist:  Victor ONEIDA HOLTS, MD (Inactive) { Click to update primary MD,subspecialty MD or APP then REFRESH:1}    History of Present Illness [name] is a 64 y.o. male PMH paroxysmal atrial fibrillation, HLD who presents for further evaluation management of atrial fibrillation.  Seen by PCP for this issue 11/2023.  Previously followed by Dr. Holts.  Underwent TEE guided cardioversion 04/2023.  Plan at that time was 1 month of post DCCV [facility] and then stop due to low CHA2DS2-VASc score.  Last LDL 118 11/2023.  Relevant CVD History -TEE guided cardioversion 04/2023; normal biventricular function, no significant valvular disease - CT chest 04/2023 aortic atherosclerosis, severe CAC LAD and LCx - TTE 12/2022 normal biventricular function, mild to moderate MR - Normal stress SPECT 05/2015   ROS: Pt denies any chest discomfort, jaw pain, arm pain, palpitations, syncope, presyncope, orthopnea, PND, or LE edema.  Studies Reviewed I have independently reviewed the patient's ECG, previous CT scan, previous medical records, previous blood work, previous cardiac testing.  Physical Exam VS:  There were no vitals taken for this visit.       Wt Readings from Last 3 Encounters:  12/01/23 188 lb 3.2 oz (85.4 kg)  06/16/23 192 lb 0.3 oz (87.1 kg)  05/29/23 171 lb (77.6 kg)    GEN: No acute distress. NECK: No JVD; No carotid bruits. CARDIAC: ***RRR, no murmurs, rubs, gallops. RESPIRATORY:  Clear to auscultation. EXTREMITIES:  Warm and well-perfused. No edema.  ASSESSMENT AND PLAN Paroxysmal atrial fibrillation CAC Aortic atherosclerosis HLD        {Are you ordering a CV Procedure (e.g. stress test, cath, DCCV, TEE, etc)?   Press F2        :[id number]}  Dispo:  ***  Signed, Victor Poser, MD  "

## 2024-01-26 ENCOUNTER — Ambulatory Visit: Payer: MEDICAID

## 2024-01-26 VITALS — BP 108/70 | HR 132 | Temp 98.8°F | Wt 181.2 lb

## 2024-01-26 DIAGNOSIS — Z113 Encounter for screening for infections with a predominantly sexual mode of transmission: Secondary | ICD-10-CM | POA: Diagnosis not present

## 2024-01-26 DIAGNOSIS — R000 Tachycardia, unspecified: Secondary | ICD-10-CM | POA: Diagnosis not present

## 2024-01-26 DIAGNOSIS — E785 Hyperlipidemia, unspecified: Secondary | ICD-10-CM

## 2024-01-26 DIAGNOSIS — F1410 Cocaine abuse, uncomplicated: Secondary | ICD-10-CM | POA: Diagnosis not present

## 2024-01-26 DIAGNOSIS — K739 Chronic hepatitis, unspecified: Secondary | ICD-10-CM

## 2024-01-26 NOTE — Progress Notes (Unsigned)
" °  ° ° °  Established patient visit   Patient: [name]   DOB: [birth year]   64 y.o. Male  MRN: [id number] Visit Date: 01/26/2024  Today's healthcare provider: Isaiah DELENA Pepper, MD   No chief complaint on file.  Subjective    HPI  Discussed the use of AI scribe software for clinical note transcription with the patient, who gave verbal consent to proceed.  History of Present Illness      Medications: Show/hide medication list[1]  Review of Systems as noted in HPI.  {Insert previous labs (optional):23779} {See past labs  Heme  Chem  Endocrine  Serology  Results Review (optional):1}   Objective    There were no vitals taken for this visit. {Insert last BP/Wt (optional):23777}{See vitals history (optional):1}  Physical Exam   No results found for any visits on 01/26/24.  Assessment & Plan     Problem List Items Addressed This Visit   None   Assessment and Plan Assessment & Plan       No follow-ups on file.       Isaiah DELENA Pepper, MD  [facility] [phone] (phone) [phone] (fax)    [1]  Outpatient Medications Prior to Visit  Medication Sig   ARIPiprazole  (ABILIFY ) 5 MG tablet Take 1 tablet (5 mg total) by mouth at bedtime.   atorvastatin  (LIPITOR) 40 MG tablet Take 1 tablet (40 mg total) by mouth daily.   doxepin  (SINEQUAN ) 100 MG capsule Take 1 capsule (100 mg total) by mouth at bedtime.   metoprolol  succinate (TOPROL -XL) 25 MG 24 hr tablet Take 1 tablet (25 mg total) by mouth daily.   topiramate  (TOPAMAX ) 50 MG tablet Take 1 tablet (50 mg total) by mouth at bedtime.   No facility-administered medications prior to visit.   "

## 2024-01-27 DIAGNOSIS — F1410 Cocaine abuse, uncomplicated: Secondary | ICD-10-CM | POA: Insufficient documentation

## 2024-01-27 LAB — HCV RNA QUANT

## 2024-01-28 LAB — HIV ANTIBODY (ROUTINE TESTING W REFLEX): HIV Screen 4th Generation wRfx: NONREACTIVE

## 2024-01-28 LAB — HEPATITIS C ANTIBODY: Hep C Virus Ab: REACTIVE — AB

## 2024-01-28 LAB — HCV RNA QUANT: Hepatitis C Quantitation: NOT DETECTED [IU]/mL

## 2024-01-28 LAB — SYPHILIS: RPR W/REFLEX TO RPR TITER AND TREPONEMAL ANTIBODIES, TRADITIONAL SCREENING AND DIAGNOSIS ALGORITHM: RPR Ser Ql: NONREACTIVE

## 2024-01-29 ENCOUNTER — Telehealth: Payer: Self-pay

## 2024-01-29 NOTE — Telephone Encounter (Signed)
 Copied from CRM #[id number]. Topic: Medical Record Request - Records Request >> Jan 29, 2024  8:08 AM Wyona P wrote: Reason for CRM: Pt is going to rehab and said he needs the results of his STD testing sent over.   Pt provided the following website.   [email or web address] >> Jan 29, 2024  2:35 PM Hadassah PARAS wrote: Pt is following up on this req. Advised results have not been read but will be read by a provider that's in office. Once read, office will print and call when available for pick up. Please advise on #[id number] >> Jan 29, 2024  8:17 AM Delon BIRCH wrote: Sent pt a mychart link to be able to get his results.  Advised once they are signed off by the provider that he can pick up a copy or sign a release for them to be sent.

## 2024-01-29 NOTE — Telephone Encounter (Signed)
 Spoke with E2C2 agent who advised patient was following up on this request. Advised agent that I forwarded message to another provider who was in office today and able to review results. Advised that we would call patient to go over results as soon as provider reviews them. Patient requests a copy of lab results with comments available to him, please call to advise him when these are ready

## 2024-01-29 NOTE — Telephone Encounter (Signed)
 Copied from CRM [id number]. Topic: General - Other >> Jan 29, 2024 12:27 PM [name] wrote: Reason for CRM: Pt would like a call [phone] back regarding his lab results.

## 2024-01-30 ENCOUNTER — Other Ambulatory Visit: Payer: Self-pay | Admitting: Family Medicine

## 2024-01-30 ENCOUNTER — Ambulatory Visit: Payer: Self-pay

## 2024-01-30 DIAGNOSIS — R7689 Other specified abnormal immunological findings in serum: Secondary | ICD-10-CM

## 2024-01-30 NOTE — Progress Notes (Signed)
 Order already found in chart. This one has been cancelled per provider

## 2024-01-30 NOTE — Telephone Encounter (Signed)
 Patient given printed results with my hand-written comments today. Unable to comment on the result note as result is in Dr Debi box and she is OOO.  Sending this to PCP as FYI. Testing is negative except for positive Hep C Ab. Getting HCV RNA quant for confirmation.

## 2024-01-30 NOTE — Addendum Note (Signed)
 Addended by: LILIAN SEVERO RAMAN on: 01/30/2024 10:10 AM   Modules accepted: Orders

## 2024-01-30 NOTE — Telephone Encounter (Signed)
 Found HCV RNA quant in the chart that is negative. Reflects old infection. No further testing needed.

## 2024-02-06 ENCOUNTER — Ambulatory Visit: Payer: Self-pay

## 2024-02-06 NOTE — Telephone Encounter (Signed)
 Spoke with Lang maxene hsu from [facility] (Recovery residential program in [place], [place]). Not there with pt currently. Not on DPR. Pt currently does not have a cell phone.   Lang reports pt told him he had a seizure this morning. Unwitnessed. Reports pt was acting normally, no confusion. Due to being in the recovery program, pt was asked by staff that he speak with his doctor to see if it was possible be a on a regimen that doesn't include mood altering medications, just anti-seizure medications. Per Lang, reports pt spoke with his doctor about this and was told it would be ok to not take his seizure meds.   Per visit note on 1/16 with PCP: The program restricts certain medications, including Depakote  and doxepin . This RN does not see where provider explicitly told pt to stop taking these meds, though.   Lang reports he will be meeting up with the pt in 20 minutes. This RN asked Lang to call back so that nurse triage could speak with pt about his symptoms and clarify what was discussed regarding his medications. Placing call into callbacks to reach out in 20 minutes to check in.     Copied from CRM #[id number]. Topic: Clinical - Medication Question >> Feb 06, 2024  4:11 PM Avram MATSU wrote: Reason for CRM: patient informed jacob [facility], he had a seizure this morning and was told he could stop taking this medication or come off it all together. Jacob would like to know what the patient was told about his medications.   Please advise [phone] ext [phone]   divalproex  (DEPAKOTE ) DR tablet 750 mg

## 2024-02-06 NOTE — Telephone Encounter (Signed)
 FYI Only or Action Required?: FYI only for provider: Stopped taking seizure meds 1 month ago. Pt will call back tomorrow to schedule appt .SABRA  Patient was last seen in primary care on 01/26/2024 by Franchot Isaiah LABOR, MD.  Called Nurse Triage reporting Seizures.  Symptoms began today.  Interventions attempted: Rest, hydration, or home remedies.  Symptoms are: stable.  Triage Disposition: See Physician Within 24 Hours  Patient/caregiver understands and will follow disposition?: Unsure   Called Lang back, was able to speak with pt. Pt had seizure this morning while getting dressed. Fell and hit head. Has a little bump. No cuts bruises or bleeding. Unsure how long it lasted exactly. Pt thinks it was about 1.5 minutes. Had some confusion afterwards but resolved after an hour. No headache. Not on blood thinner. Last seizure pt had was right before Christmas. Has a referral to neurology, has not followed up yet since entering program.   Pt was on depakote  and topamax . Has not taken since last seizure around Christmas 2025. Didn't like the side effects. States he asked his provider about substitutes that had less side effects, was told there were no substitutes. Advised to f/u with provider and offered to schedule soonest appt with PCP. Jacob needs to check with program director first and will call back tomorrow to schedule. Advised ED or 911 for worsening symptoms in the meantime. Pt verbalized understanding.     Reason for Disposition  Stopped taking antiseizure medicine (anticonvulsant)  Answer Assessment - Initial Assessment Questions 1. ONSET: When did the seizure occur?     This morning  2. DURATION: How long did the seizure last (or how long has it been happening)? (e.g., seconds, minutes)  Note: Most seizures last less than 5 minutes.     Unsure, probably about 1.5 minutes  3. DESCRIPTION: Describe what happened during the seizure. Did the body become stiff? Was there any  jerking?  Did they lose consciousness during the seizure?     Pt reports he just blacked out with some confusion afterwards.   4. CIRCUMSTANCE: What was the person doing when the seizure began?      Was getting dressed  5. MENTAL STATUS AFTER SEIZURE: Does the person seem more groggy or sleepy? Does the person know who they are, who you are, and where they are now?      Was a little confused at first, cleared up after an hour  6. PRIOR SEIZURES: Has the person had a seizure (convulsion) before? (e.g., epilepsy, other cause)  If Yes, ask: When was the last time? and What happened last time?      Last seizure pt had was right before Christmas.   7. EPILEPSY: Does the person have epilepsy? Note: Check for medical ID bracelet.     Denies  8. MEDICINES: Does the person take anticonvulsant medications? (e.g., Yes, No; missed doses, any recent changes)     Topamax  and depakote . Stopped taking Christmas 2025.  9. INJURY: Was the person hurt or injured during the seizure? (e.g., hit their head, bit their tongue)     Hit head, has small bump. No headache  10. OTHER SYMPTOMS: Are there any other symptoms? (e.g., fever, headache)       Denies  Protocols used: [facility]

## 2024-02-09 ENCOUNTER — Ambulatory Visit: Payer: Self-pay

## 2024-02-09 ENCOUNTER — Ambulatory Visit: Payer: MEDICAID | Admitting: Family Medicine

## 2024-02-09 ENCOUNTER — Encounter: Payer: Self-pay | Admitting: Family Medicine

## 2024-02-09 VITALS — BP 144/113 | HR 109 | Resp 16 | Ht 69.0 in | Wt 181.0 lb

## 2024-02-09 DIAGNOSIS — F315 Bipolar disorder, current episode depressed, severe, with psychotic features: Secondary | ICD-10-CM

## 2024-02-09 DIAGNOSIS — I10 Essential (primary) hypertension: Secondary | ICD-10-CM

## 2024-02-09 DIAGNOSIS — R569 Unspecified convulsions: Secondary | ICD-10-CM | POA: Diagnosis not present

## 2024-02-09 DIAGNOSIS — F1910 Other psychoactive substance abuse, uncomplicated: Secondary | ICD-10-CM

## 2024-02-09 DIAGNOSIS — K739 Chronic hepatitis, unspecified: Secondary | ICD-10-CM

## 2024-02-09 DIAGNOSIS — F4310 Post-traumatic stress disorder, unspecified: Secondary | ICD-10-CM

## 2024-02-09 DIAGNOSIS — Z1159 Encounter for screening for other viral diseases: Secondary | ICD-10-CM

## 2024-02-09 DIAGNOSIS — Z23 Encounter for immunization: Secondary | ICD-10-CM

## 2024-02-09 DIAGNOSIS — Z1211 Encounter for screening for malignant neoplasm of colon: Secondary | ICD-10-CM

## 2024-02-09 DIAGNOSIS — F333 Major depressive disorder, recurrent, severe with psychotic symptoms: Secondary | ICD-10-CM

## 2024-02-09 DIAGNOSIS — I480 Paroxysmal atrial fibrillation: Secondary | ICD-10-CM

## 2024-02-09 DIAGNOSIS — I25118 Atherosclerotic heart disease of native coronary artery with other forms of angina pectoris: Secondary | ICD-10-CM

## 2024-02-09 DIAGNOSIS — E785 Hyperlipidemia, unspecified: Secondary | ICD-10-CM

## 2024-02-09 MED ORDER — LEVETIRACETAM 500 MG PO TABS: 500.0000 mg | ORAL_TABLET | Freq: Two times a day (BID) | ORAL | 1 refills | Status: AC

## 2024-02-09 NOTE — Telephone Encounter (Signed)
 FYI Only or Action Required?: Action required by provider: clinical question for provider and update on patient condition.  Patient was last seen in primary care on 02/09/2024 by Gasper Nancyann BRAVO, MD.  Called Nurse Triage reporting Hypertension.  Symptoms began today.  Interventions attempted: Prescription medications: metoprolol .  Symptoms are: unchanged.  Triage Disposition: See PCP When Office is Open (Within 3 Days)  Patient/caregiver understands and will follow disposition?: Yes    Message from [place] R sent at 02/09/2024  5:30 PM EST  Summary: Medication   Reason for Triage: Pt took medication metoprolol  succinate (TOPROL -XL) 25 MG 24 hr tablet this morning. Is it okay for him to take another this evening? Please call pt at [phone]          Reason for Disposition  Systolic BP >= 160 OR Diastolic >= 100  Protocols used: Blood Pressure - High-A-AH  Pt called questioning if he could take a second metoprolol  today d/t his BP being elevated at his appt; 144/113 R arm cuff. Pt reports he does have a h/a but no vision changes or dizziness. Discussed as an RN I could not advice him to take more or less of his medication. Pt seemed as if he was go in to take second dose based on conversation so advised him to monitor any symptoms of worsening h/a, visual changes, dizziness or lightheadedness. Pt states he does have BP cuff at home and is able to monitor BP readings. Encouraged him to take medication as directed and monitor BP as well as symptoms. Pt voiced understanding.     1. BLOOD PRESSURE: What is your blood pressure? Did you take at least two measurements 5 minutes apart?     144/113 at PCP office today during visit   2. ONSET: When did you take your blood pressure?     Today during visit   3. HOW: How did you take your blood pressure? (e.g., automatic home BP monitor, visiting nurse)     BP cuff at clinic   4. HISTORY: Do you have a history of high blood  pressure?     Yes   5. MEDICINES: Are you taking any medicines for blood pressure? Have you missed any doses recently?     Yes; pt takes metoprolol  daily   6. OTHER SYMPTOMS: Do you have any symptoms? (e.g., blurred vision, chest pain, difficulty breathing, headache, weakness)     H/a

## 2024-02-09 NOTE — Progress Notes (Signed)
 "     Established patient visit   Patient: [name]   DOB: [birth year]   64 y.o. Male  MRN: [id number] Visit Date: 02/09/2024  Today's healthcare provider: Nancyann Perry, MD   Chief Complaint  Patient presents with   Acute Visit   Medication Refill    Med Refill ( pt wants meds Keppra )   Subjective    Discussed the use of AI scribe software for clinical note transcription with the patient, who gave verbal consent to proceed.  History of Present Illness   [name] is a 64 year old male resident residing in a drug rehab facility and with a history of seizures who presents for medication management. The facility in which he resides does not permit administration of valproic acid  and advised him they prefer Keppra  for seizure management. 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 He has been using crack cocaine for over forty years. His seizures are brief, lasting less than a minute to a minute and a half, and are emotionally distressing. He receives a brief warning before a seizure, described as 'the lights are getting ready to go out', and attempts to find a safe place to avoid injury. He often experiences seizures alone.  He was previously on Depakote  for seizure management, starting at 500 mg and increasing to 2500 mg due to increased seizure frequency. He discontinued Depakote  in December 2025 due to side effects and had a seizure shortly before Christmas. He experienced another small seizure upon entering a new living program. He has not had a formal diagnosis of the type of seizures and was in the process of undergoing neurological studies before entering his current program.  He is currently taking metoprolol  for blood pressure and a cholesterol medication.       Medications: Show/hide medication  list[1] Review of Systems  Respiratory: Negative.  Negative for cough, shortness of breath and wheezing.   Cardiovascular:  Negative for chest pain, palpitations and leg swelling.  Neurological:  Negative for weakness and headaches.       Objective    BP (!) 144/113 (BP Location: Left Arm, Patient Position: Sitting, Cuff Size: Normal)   Pulse (!) 109   Resp 16   Ht 5' 9 (1.753 m)   Wt 181 lb (82.1 kg)   SpO2 100%   BMI 26.73 kg/m   Physical Exam   General: Appearance:    Well developed, well nourished male in no acute distress  Eyes:    PERRL, conjunctiva/corneas clear, EOM's intact       Lungs:     Clear to auscultation bilaterally, respirations unlabored  Heart:    Tachycardic. Regular rhythm. No murmurs, rubs, or gallops.    MS:   All extremities are intact.    Neurologic:   Awake, alert, oriented x 3. No apparent focal neurological defect.       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 Recent seizure after stopping Depakote . No recent neurology evaluation for seizure type. - Prescribed Keppra  500 mg twice daily. - Scheduled follow-up in 3-4 weeks with PCPto assess response to Keppra . - Sent prescription to [facility].  Hypertension Blood pressure well-controlled with metoprolol . - Continue metoprolol  as prescribed.   Hyperlipidemia - Continue current cholesterol  medication.    Return in about 4 weeks (around 03/08/2024).      Nancyann Perry, MD  [facility] [phone] (phone) [phone] (fax)   Medical Group     [1]  Outpatient Medications Prior to Visit  Medication Sig   atorvastatin  (LIPITOR) 40 MG tablet Take 1 tablet (40 mg total) by mouth daily.   metoprolol  succinate (TOPROL -XL) 25 MG 24 hr tablet Take 1 tablet (25 mg total) by mouth daily.   ARIPiprazole  (ABILIFY ) 5 MG tablet Take 1 tablet (5 mg total) by mouth at bedtime.  (Patient not taking: Reported on 02/09/2024)   doxepin  (SINEQUAN ) 100 MG capsule Take 1 capsule (100 mg total) by mouth at bedtime. (Patient not taking: Reported on 02/09/2024)   topiramate  (TOPAMAX ) 50 MG tablet Take 1 tablet (50 mg total) by mouth at bedtime. (Patient not taking: Reported on 02/09/2024)   No facility-administered medications prior to visit.   "

## 2024-02-09 NOTE — Patient Instructions (Signed)
 SABRA  Please review the attached list of medications and notify my office if there are any errors.   . Please bring all of your medications to every appointment so we can make sure that our medication list is the same as yours.

## 2024-02-09 NOTE — Telephone Encounter (Signed)
 Spoke with patient's brother, [name], who states that patient is currently in rehab for 9 mos and his phone is with him. He is in charge of patient's affairs for the time being, but is not sure if patient may have called from facility. Unable to call patient directly at this time. [name] states that we can contact him if needed at [phone].   Summary: Medication   Reason for Triage: Pt took medication metoprolol  succinate (TOPROL -XL) 25 MG 24 hr tablet this morning. Is it okay for him to take another this evening? Please call pt at [phone]

## 2024-02-14 ENCOUNTER — Other Ambulatory Visit: Payer: Self-pay

## 2024-02-14 ENCOUNTER — Emergency Department
Admission: EM | Admit: 2024-02-14 | Discharge: 2024-02-15 | Disposition: A | Payer: MEDICAID | Source: Home / Self Care | Attending: Emergency Medicine | Admitting: Emergency Medicine

## 2024-02-14 DIAGNOSIS — I4892 Unspecified atrial flutter: Secondary | ICD-10-CM

## 2024-02-14 DIAGNOSIS — F315 Bipolar disorder, current episode depressed, severe, with psychotic features: Secondary | ICD-10-CM | POA: Diagnosis present

## 2024-02-14 DIAGNOSIS — F32A Depression, unspecified: Secondary | ICD-10-CM

## 2024-02-14 LAB — COMPREHENSIVE METABOLIC PANEL WITH GFR
ALT: 41 U/L (ref 0–44)
AST: 29 U/L (ref 15–41)
Albumin: 4.8 g/dL (ref 3.5–5.0)
Alkaline Phosphatase: 95 U/L (ref 38–126)
Anion gap: 13 (ref 5–15)
BUN: 9 mg/dL (ref 8–23)
CO2: 24 mmol/L (ref 22–32)
Calcium: 10 mg/dL (ref 8.9–10.3)
Chloride: 102 mmol/L (ref 98–111)
Creatinine, Ser: 0.95 mg/dL (ref 0.61–1.24)
GFR, Estimated: >60 mL/min
Glucose, Bld: 100 mg/dL — ABNORMAL HIGH (ref 70–99)
Potassium: 4.2 mmol/L (ref 3.5–5.1)
Sodium: 139 mmol/L (ref 135–145)
Total Bilirubin: 1.2 mg/dL (ref 0.0–1.2)
Total Protein: 7.8 g/dL (ref 6.5–8.1)

## 2024-02-14 LAB — CBC
HCT: 43.7 % (ref 39.0–52.0)
Hemoglobin: 14.4 g/dL (ref 13.0–17.0)
MCH: 29 pg (ref 26.0–34.0)
MCHC: 33 g/dL (ref 30.0–36.0)
MCV: 87.9 fL (ref 80.0–100.0)
Platelets: 173 10*3/uL (ref 150–400)
RBC: 4.97 MIL/uL (ref 4.22–5.81)
RDW: 14.6 % (ref 11.5–15.5)
WBC: 6.2 10*3/uL (ref 4.0–10.5)
nRBC: 0 % (ref 0.0–0.2)

## 2024-02-14 LAB — URINE DRUG SCREEN
Amphetamines: NEGATIVE
Barbiturates: NEGATIVE
Benzodiazepines: NEGATIVE
Cocaine: NEGATIVE
Fentanyl: NEGATIVE
Methadone Scn, Ur: NEGATIVE
Opiates: NEGATIVE
Tetrahydrocannabinol: POSITIVE — AB

## 2024-02-14 LAB — ETHANOL: Alcohol, Ethyl (B): <15 mg/dL

## 2024-02-14 LAB — SALICYLATE LEVEL: Salicylate Lvl: <7 mg/dL — ABNORMAL LOW (ref 7.0–30.0)

## 2024-02-14 LAB — SARS CORONAVIRUS 2 BY RT PCR: SARS Coronavirus 2 by RT PCR: NEGATIVE

## 2024-02-14 LAB — ACETAMINOPHEN LEVEL: Acetaminophen (Tylenol), Serum: <10 ug/mL — ABNORMAL LOW (ref 10–30)

## 2024-02-14 MED ORDER — METOPROLOL TARTRATE 5 MG/5ML IV SOLN: 5.0000 mg | Freq: Once | INTRAVENOUS | Status: DC

## 2024-02-14 MED ORDER — METOPROLOL SUCCINATE ER 50 MG PO TB24: 50.0000 mg | ORAL_TABLET | Freq: Every day | ORAL | Status: DC

## 2024-02-14 MED ORDER — METOPROLOL SUCCINATE ER 25 MG PO TB24
25.0000 mg | ORAL_TABLET | Freq: Every day | ORAL | Status: DC
  Administered 2024-02-14: 25 mg via ORAL
  Filled 2024-02-14: qty 1

## 2024-02-14 MED ORDER — METOPROLOL TARTRATE 5 MG/5ML IV SOLN
5.0000 mg | Freq: Once | INTRAVENOUS | Status: AC
  Administered 2024-02-14: 5 mg via INTRAVENOUS
  Filled 2024-02-14: qty 5

## 2024-02-14 MED ORDER — METOPROLOL SUCCINATE ER 25 MG PO TB24: 25.0000 mg | ORAL_TABLET | Freq: Every day | ORAL | Status: DC

## 2024-02-14 MED ORDER — ACETAMINOPHEN 325 MG PO TABS
650.0000 mg | ORAL_TABLET | Freq: Once | ORAL | Status: AC
  Administered 2024-02-14: 650 mg via ORAL
  Filled 2024-02-14: qty 2

## 2024-02-14 NOTE — ED Notes (Signed)
 Pt awake at this time. Offered snack and pt refused.

## 2024-02-14 NOTE — ED Notes (Signed)
 Pt maintains HR in aflutter with rate 100's occasionally jumping 110-120's. Provider notified. No new orders at this time.

## 2024-02-14 NOTE — Consult Note (Signed)
 [facility] {CHL [facility] INITIAL OR FOLLOW LE:68184}  Patient Name: .[name]  MRN: [id number]  DOB: October 31, 1960  Consult Order details:  Orders (From admission, onward)     Start     Ordered   02/14/24 1201  CONSULT TO CALL ACT TEAM       Ordering Provider: Ernest Ronal BRAVO, MD  Provider:  (Not yet assigned)  Question:  Reason for Consult?  Answer:  Psych consult   02/14/24 1201   02/14/24 1201  IP CONSULT TO PSYCHIATRY       Ordering Provider: Ernest Ronal BRAVO, MD  Provider:  (Not yet assigned)  Question:  Reason for consult:  Answer:  Medication management   02/14/24 1201             Mode of Visit: {Type of visit:31911}    Psychiatry Consult Evaluation  Service Date: February 14, 2024 LOS:  LOS: 0 days  Chief Complaint ***  Primary Psychiatric Diagnoses  ***   Assessment  CLINICAL DECISION MAKING:  [name] is a 64 y.o. male admitted: {CHL BH Medical or Presented to ZI:68182}qnm 02/14/2024 11:55 AM for ***. He carries the psychiatric diagnoses of *** and has a past medical history of  ***.     Diagnoses:  Active Hospital problems: Active Problems:   * No active hospital problems. *    Plan   ## Disposition:  ## Psychiatric Medication Recommendations:    ## Medical Decision Making Capacity: {CHL BH MEDICAL DECISION MAKING CAPACITY:31818}  ## Further Work-up:  EKG- QTC: Labs:  ## Behavioral / Environmental: -{CHLmacbehavioralenvironmental2:31847}    ## Safety and Observation Level:  - Based on my clinical evaluation, I estimate the patient to be at *** risk of self harm in the current setting. - At this time, we recommend  {CHL BH SUICIDE OBSERVATION LEVEL:31850}. This decision is based on my review of the chart including patient's history and current presentation, interview of the patient, mental status examination, and consideration of suicide risk including evaluating suicidal ideation, plan, intent, suicidal or  self-harm behaviors, risk factors, and protective factors. This judgment is based on our ability to directly address suicide risk, implement suicide prevention strategies, and develop a safety plan while the patient is in the clinical setting. Please contact our team if there is a concern that risk level has changed.  CSSR Risk Category:C-SSRS RISK CATEGORY:  Flowsheet Row ED from 02/14/2024 in [facility] Emergency Department at [facility] ED from 06/16/2023 in [facility] Emergency Department at [facility] ED from 05/29/2023 in [facility] Emergency Department at [facility]  C-SSRS RISK CATEGORY Low Risk High Risk Moderate Risk     Suicide Risk Assessment: Patient has following modifiable risk factors for suicide: {CHLmacmodifiablesuicideriskfactors:31822}, which we are addressing by ***.  Patient has following non-modifiable or demographic risk factors for suicide: {CHLmacnonmodifiablesuicideriskfactors:31823}  Patient has the following protective factors against suicide: {CHLmacprotectivefactors:31824}  Thank you for this consult request. Recommendations have been communicated to the primary team.  We will *** at this time.       History of Present Illness  Relevant Aspects of Hospital [facility] or ED course:31819}   Patient Report:     Psychiatric and Social History  Psychiatric History:  Information collected from patient/chart  Prev Dx/Sx:  Current Psych Provider:  Home Meds (current):  Previous Med Trials:  Therapy:  Prior Psych Hospitalization:  Prior Suicide attempt/Self Harm:  Prior Violence:   Family Psych History:  Family Hx suicide:   Social History:  Educational Hx:  Occupational Hx:  Legal Hx:  Living Situation:  Spiritual Hx:  Access to weapons/lethal means: ***   Substance History Alcohol:  Last Drink : Number of drinks per day : History of alcohol withdrawal seizures: History of DT's: Tobacco:  Illicit drugs:   Prescription drug abuse:  Rehab hx:   Exam Findings  Physical Exam: Reviewed and agree with the physical exam findings conducted by the medical provider Vital Signs:  Temp:  [98.1 F (36.7 C)] 98.1 F (36.7 C) (02/04 1142) Pulse Rate:  [112] 112 (02/04 1142) Resp:  [18] 18 (02/04 1142) BP: (118)/(87) 118/87 (02/04 1142) SpO2:  [100 %] 100 % (02/04 1142) Weight:  [82 kg] 82 kg (02/04 1141) Blood pressure 118/87, pulse (!) 112, temperature 98.1 F (36.7 C), resp. rate 18, weight 82 kg, SpO2 100%. Body mass index is 26.7 kg/m.    Mental Status Exam: General Appearance: {Appearance:22683}  Orientation:  {BHH ORIENTATION (PAA):22689}  Memory:  {BHH MEMORY:22881}  Concentration:  {Concentration:21399}  Recall:  {BHH GOOD/FAIR/POOR:22877}  Attention  {BH Attention Span:31825}  Eye Contact:  {BHH EYE CONTACT:22684}  Speech:  {Speech:22685}  Language:  {BHH GOOD/FAIR/POOR:22877}  Volume:  {Volume (PAA):22686}  Mood: ***  Affect:  {Affect (PAA):22687}  Thought Process:  {Thought Process (PAA):22688}  Thought Content:  {Thought Content:22690}  Suicidal Thoughts:  {ST/HT (PAA):22692}  Homicidal Thoughts:  {ST/HT (PAA):22692}  Judgement:  {Judgement (PAA):22694}  Insight:  {Insight (PAA):22695}  Psychomotor Activity:  {Psychomotor (PAA):22696}  Akathisia:  {BHH YES OR NO:22294}  Fund of Knowledge:  {BHH GOOD/FAIR/POOR:22877}      Assets:  {Assets (PAA):22698}  Cognition:  {chl bhh cognition:304700322}  ADL's:  {BHH JIO'D:77709}  AIMS (if indicated):        Other History   These have been pulled in through the EMR, reviewed, and updated if appropriate.  Family History:  The patient's family history includes CVA in his mother; Depression in his brother.  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 echo 5/17: EF 60-65%, no RWMA, LV dias fxn nl, LA mildly dilated, RV sys fxn nl, PASP nl, moderate sized circumferential pericardial effusion was identified, 2.12 cm around the LV free wall, <1 cm around the RV free wall. Features were not c/w tamponade physiology   PTSD (post-traumatic stress disorder)    Witnessed brother's suicide.   Restless leg syndrome    Seizures (HCC)    Syncope     Surgical History: Past Surgical History:  Procedure Laterality Date   APPENDECTOMY     CARDIOVERSION N/A 04/17/2023   Procedure: CARDIOVERSION;  Surgeon: Darliss Rogue, MD;  Location: [facility] ORS;  Service: Cardiovascular;  Laterality: N/A;   CYSTOSCOPY WITH URETEROSCOPY AND STENT PLACEMENT     ESOPHAGOGASTRODUODENOSCOPY N/A 01/11/2016   Procedure: ESOPHAGOGASTRODUODENOSCOPY (EGD);  Surgeon: Jerrell Sol, MD;  Location: [facility] ENDOSCOPY;  Service: Endoscopy;  Laterality: N/A;   ESOPHAGOGASTRODUODENOSCOPY N/A 04/09/2020   Procedure: ESOPHAGOGASTRODUODENOSCOPY (EGD);  Surgeon: Therisa Bi, MD;  Location: [facility] ENDOSCOPY;  Service: Gastroenterology;  Laterality: N/A;   INCISION AND DRAINAGE ABSCESS Left 01/02/2018   Procedure: INCISION AND DRAINAGE LEFT KNEE;  Surgeon: Cleotilde Barrio, MD;  Location: [facility] ORS;  Service: Orthopedics;  Laterality: Left;   JOINT REPLACEMENT Right    TKR   KNEE ARTHROSCOPY Right 06/25/2014   Procedure: ARTHROSCOPY KNEE;  Surgeon: Barrio Cleotilde, MD;  Location: [facility] ORS;  Service: Orthopedics;  Laterality: Right;  partial arthroscopic medial menisectomy   LAPAROSCOPIC APPENDECTOMY N/A 06/02/2021   Procedure: APPENDECTOMY LAPAROSCOPIC;  Surgeon: Lane Shope, MD;  Location: [facility] ORS;  Service: General;  Laterality: N/A;   TEE WITHOUT CARDIOVERSION N/A 04/17/2023   Procedure: ECHOCARDIOGRAM,  TRANSESOPHAGEAL;  Surgeon: Darliss Rogue, MD;  Location: [facility] ORS;  Service: Cardiovascular;  Laterality: N/A;   TOTAL KNEE ARTHROPLASTY Right 04/22/2015   Procedure: TOTAL KNEE ARTHROPLASTY;  Surgeon: Barrio Cleotilde, MD;  Location: [facility] ORS;  Service: Orthopedics;  Laterality: Right;   TOTAL KNEE ARTHROPLASTY Left 10/30/2017   Procedure: TOTAL KNEE ARTHROPLASTY;  Surgeon: Cleotilde Barrio, MD;  Location: [facility] ORS;  Service: Orthopedics;  Laterality: Left;   TOTAL KNEE REVISION Left 01/02/2018   Procedure: poly exchange of tibia and patella left knee;  Surgeon: Cleotilde Barrio, MD;  Location: [facility] ORS;  Service: Orthopedics;  Laterality: Left;   UMBILICAL HERNIA REPAIR  06/02/2021   Procedure: HERNIA REPAIR UMBILICAL ADULT;  Surgeon: Lane Shope, MD;  Location: [facility] ORS;  Service: General;;     Medications:  Current Medications[1]  Allergies: Allergies[2]  Zelda Sharps, NP     [1]  Current Facility-Administered Medications:    metoprolol  tartrate (LOPRESSOR ) injection 5 mg, 5 mg, Intravenous, Once, Ernest Ronal BRAVO, MD  Current Outpatient Medications:    atorvastatin  (LIPITOR) 40 MG tablet, Take 1 tablet (40 mg total) by mouth daily., Disp: 90 tablet, Rfl: 3   levETIRAcetam  (KEPPRA ) 500 MG tablet, Take 1 tablet (500 mg total) by mouth 2 (two) times daily., Disp: 60 tablet, Rfl: 1   metoprolol  succinate (TOPROL -XL) 25 MG 24 hr tablet, Take 1 tablet (25 mg total) by mouth daily., Disp: 90 tablet, Rfl: 3 [2]  Allergies Allergen Reactions   Ketorolac  Tromethamine  Hives and Rash    Other Reaction(s): Not available

## 2024-02-14 NOTE — ED Provider Notes (Signed)
 Patient's hear rate improved with oral and one time IV dose of metoprolol . No concerning electrolyte abnormalities. Patient is medically cleared for psychiatric disposition at this time.   Floy Roberts, MD 02/14/24 2201

## 2024-02-14 NOTE — BH Assessment (Signed)
 Comprehensive Clinical Assessment (CCA) Screening, Triage and Referral Note  02/14/2024 [facility] [name] [id number]  [name], 64 year old male who presents to [facility] ED involuntarily for treatment. Per triage note, C/O suicidal thoughts x several days.  Presents with suitcase. States he was sent to ED from [facility].   During TTS assessment pt presents alert and oriented x 4, restless but cooperative, and mood-congruent with affect. The pt does not appear to be responding to internal or external stimuli. Neither is the pt presenting with any delusional thinking. Pt verified the information provided to triage RN.   Pt identifies his main complaint to be that he is suicidal. Patient reports he has been off his bipolar meds for 3 weeks since living in a long-term rehabilitation facility. Patient expressed feelings of hopelessness and worthlessness. Patient says he does not have a plan but his depressive symptoms have worsened over the past week. I am constantly having thoughts. Patient reports has been sober for about month. Patient says he is not currently being followed by an outpatient provider yet tried going to [facility] to setup treatment. Pt reports family hx of MH where patient says his younger brother shot himself in front of him. Pt reports a medical hx of hypertension and high cholesterol. Pt denies current HI/VH. Patient states he hears command voices that tell him to kill himself. Pt is unable to contract for safety and believes inpatient admission would be beneficial.    Per Zelda, NP, pt is recommended for inpatient psychiatric admission when medically cleared.   Chief Complaint:  Chief Complaint  Patient presents with   Mental Health Problem   Visit Diagnosis: Bipolar  Patient Reported Information How did you hear about us ? Self  What Is the Reason for Your Visit/Call Today? Patient came to ED due to [employer] and has been off meds for 3 weeks.  How Long Has This Been Causing You Problems? >  than 6 months  What Do You Feel Would Help You the Most Today? Treatment for Depression or other mood problem; Medication(s)   Have You Recently Had Any Thoughts About Hurting Yourself? Yes  Are You Planning to Commit Suicide/Harm Yourself At This time? No   Have you Recently Had Thoughts About Hurting Someone [place]? No  Are You Planning to Harm Someone at This Time? No  Explanation: Patient endorsed having worsening thoughts of SI   Have You Used Any Alcohol or Drugs in the Past 24 Hours? No  How Long Ago Did You Use Drugs or Alcohol? No data recorded What Did You Use and How Much? No data recorded  Do You Currently Have a Therapist/Psychiatrist? No  Name of Therapist/Psychiatrist: [facility]- Ingtid Gerney   Have You Been Recently Discharged From Any Public [employer] Account Executive or Programs? No  Explanation of Discharge From Practice/Program: No data recorded   CCA Screening Triage Referral Assessment Type of Contact: Face-to-Face  Telemedicine Service Delivery:   Is this Initial or Reassessment?   Date Telepsych consult ordered in CHL:    Time Telepsych consult ordered in CHL:    Location of Assessment: [facility] ED  Provider Location: [facility] ED    Collateral Involvement: None reported   Does Patient Have a Court Appointed Legal Guardian? No data recorded Name and Contact of Legal Guardian: No data recorded If Minor and Not Living with Parent(s), Who has Custody? No data recorded Is CPS involved or ever been involved? Never  Is APS involved or ever been involved? Never   Patient Determined  To Be At Risk for Harm To Self or Others Based on Review of Patient Reported Information or Presenting Complaint? Yes, for Self-Harm  Method: No Plan  Availability of Means: No access or NA  Intent: Vague intent or NA  Notification Required: No need or identified person  Additional Information for Danger to Others Potential: No data recorded Additional Comments for Danger to Others  Potential: N/A  Are There Guns or Other Weapons in Your Home? No  Types of Guns/Weapons: N/A  Are These Weapons Safely Secured?                            No  Who Could Verify You Are Able To Have These Secured: N/A  Do You Have any Outstanding Charges, Pending Court Dates, Parole/Probation? None reported  Contacted To Inform of Risk of Harm To Self or Others: No data recorded  Does Patient Present under Involuntary Commitment? No    [place] of Residence: [place]   Patient Currently Receiving the Following Services: Not Receiving Services   Determination of Need: Emergent (2 hours)   Options For Referral: ED Visit; Medication Management; Inpatient Hospitalization; Intensive Outpatient Therapy   Disposition Recommendation per psychiatric provider: Per Zelda, NP, pt is recommended for inpatient psychiatric admission when medically cleared.  [name], Counselor, LCAS-A

## 2024-02-14 NOTE — ED Triage Notes (Signed)
 C/O suicidal thoughts x several days.  Presents with suitcase.States was sent to ED from [facility].  Patient is AAOx3. Calm and cooperative.  STates has history of bipolar.  States has staying at [facility] where they changed his medications Brings metoprolol  ER  25 mg every day Keppra  500 mg every day and Atorvastatin  40 mg QD

## 2024-02-14 NOTE — ED Notes (Signed)
Ambulatory to and from bathroom with steady gait.

## 2024-02-14 NOTE — ED Provider Notes (Signed)
 "  [facility] Provider Note    Event Date/Time   First MD Initiated Contact with Patient 02/14/24 1157     (approximate)   History   Mental Health Problem   HPI  [name] is a 64 y.o. male with seizures, bipolar, afib not on AC who comes in with suicidal thoughts over the past couple of days.  Patient was sent to the ED by [facility].  Patient reports that some of his medications were moved around given where he was living out.  He denies any seizures in the past couple of days, denies any new chest pain, shortness of breath or any other concerns.  He is not currently on any anticoagulation.  He reports having SI thoughts without any active plan.  Physical Exam   Triage Vital Signs: ED Triage Vitals  Encounter Vitals Group     BP 02/14/24 1142 118/87     Girls Systolic BP Percentile --      Girls Diastolic BP Percentile --      Boys Systolic BP Percentile --      Boys Diastolic BP Percentile --      Pulse Rate 02/14/24 1142 (!) 112     Resp 02/14/24 1142 18     Temp 02/14/24 1142 98.1 F (36.7 C)     Temp src --      SpO2 02/14/24 1142 100 %     Weight 02/14/24 1141 180 lb 12.4 oz (82 kg)     Height --      Head Circumference --      Peak Flow --      Pain Score 02/14/24 1141 0     Pain Loc --      Pain Education --      Exclude from Growth Chart --     Most recent vital signs: Vitals:   02/14/24 1142  BP: 118/87  Pulse: (!) 112  Resp: 18  Temp: 98.1 F (36.7 C)  SpO2: 100%     General: Awake, no distress.  CV:  Good peripheral perfusion.  Resp:  Normal effort.  Abd:  No distention.  Other:  Moving all extremities well.  Patient reports SI without plan however he is very calm   ED Results / Procedures / Treatments   Labs (all labs ordered are listed, but only abnormal results are displayed) Labs Reviewed  COMPREHENSIVE METABOLIC PANEL WITH GFR  ETHANOL  CBC  URINE DRUG SCREEN     EKG  My interpretation of  EKG:  Atrial flutter with a rate of 121 without any ST elevation or T wave versions, normal intervals  RADIOLOGY   PROCEDURES:  Critical Care performed: No  Procedures   MEDICATIONS ORDERED IN ED: Medications  metoprolol  tartrate (LOPRESSOR ) injection 5 mg (has no administration in time range)     IMPRESSION / MDM / ASSESSMENT AND PLAN / ED COURSE  I reviewed the triage vital signs and the nursing notes.   Patient's presentation is most consistent with acute presentation with potential threat to life or bodily function.   Pt is without any acute medical complaints.  Patient does report his medications have been adjusted and I did see that patient was tachycardic initially in triage.  I did get a EKG that does confirm atrial flutter so I did recommend patient be placed on the cardiac monitor and given 5 of IV metoprolol .  Patient however denies any other symptoms and he is a chronic history of  this.  Suspect that is related to medications.  Patient's blood work is otherwise reassuring with normal CMP CBC normal.  2:28 PM unclear the delay in patient getting metoprolol .  I did ask the nurse to place patient on the cardiac monitor and to recheck heart rate to see if metoprolol  was still necessary.  3:00 PM patient is now hooked up to the monitor he is a difficult IV access his heart rates are between 100-120 he last took his metoprolol  last night.  Would order him a dose of the metoprolol  now.  I suspect he may just need a higher dose.  Patient will be handed off to oncoming team pending reevaluation of heart rates after IV has been placed.  The patient has been placed in psychiatric observation due to the need to provide a safe environment for the patient while obtaining psychiatric consultation and evaluation, as well as ongoing medical and medication management to treat the patient's condition.  The patient has not been placed under full IVC at this time.    The patient is on the  cardiac monitor to evaluate for evidence of arrhythmia and/or significant heart rate changes.      FINAL CLINICAL IMPRESSION(S) / ED DIAGNOSES   Final diagnoses:  Depression, unspecified depression type  Atrial flutter with rapid ventricular response (HCC)     Rx / DC Orders   ED Discharge Orders     None        Note:  This document was prepared using Dragon voice recognition software and may include unintentional dictation errors.   Ernest Ronal BRAVO, MD 02/14/24 1517  "

## 2024-02-14 NOTE — ED Notes (Signed)
 Belongings Pair of shoes Par of socks Pair of pants Belt Underware Teechisrt Coat suitcase White metal chain with cross [place] - $32 cash and ID's (pt placed in pocket of pants) Water bottle  Pt keeping reading glasses with him.

## 2024-02-14 NOTE — ED Notes (Signed)
 VOL/  PENDING  CONSULT

## 2024-02-14 NOTE — ED Notes (Signed)
 Meal tray provided. Tray checked for any potential hazards. Pt denies no additional needs at this time.

## 2024-02-15 ENCOUNTER — Inpatient Hospital Stay: Admission: AD | Admit: 2024-02-15 | Payer: MEDICAID | Source: Intra-hospital | Admitting: Psychiatry

## 2024-02-15 ENCOUNTER — Encounter: Payer: Self-pay | Admitting: Psychiatry

## 2024-02-15 ENCOUNTER — Ambulatory Visit: Payer: MEDICAID | Admitting: Neurology

## 2024-02-15 DIAGNOSIS — F332 Major depressive disorder, recurrent severe without psychotic features: Secondary | ICD-10-CM | POA: Diagnosis present

## 2024-02-15 DIAGNOSIS — F329 Major depressive disorder, single episode, unspecified: Secondary | ICD-10-CM | POA: Diagnosis present

## 2024-02-15 LAB — RESPIRATORY PANEL BY PCR

## 2024-02-15 LAB — RESP PANEL BY RT-PCR (RSV, FLU A&B, COVID)  RVPGX2
Influenza A by PCR: NEGATIVE
Influenza B by PCR: NEGATIVE
Resp Syncytial Virus by PCR: NEGATIVE
SARS Coronavirus 2 by RT PCR: NEGATIVE

## 2024-02-15 MED ORDER — SERTRALINE HCL 50 MG PO TABS
50.0000 mg | ORAL_TABLET | Freq: Every day | ORAL | Status: AC
  Administered 2024-02-16: 50 mg via ORAL
  Filled 2024-02-15: qty 1

## 2024-02-15 MED ORDER — DIPHENHYDRAMINE HCL 50 MG/ML IJ SOLN: 50.0000 mg | Freq: Three times a day (TID) | INTRAMUSCULAR | Status: AC | PRN

## 2024-02-15 MED ORDER — DIPHENHYDRAMINE HCL 25 MG PO CAPS: 25.0000 mg | ORAL_CAPSULE | Freq: Four times a day (QID) | ORAL | Status: AC | PRN

## 2024-02-15 MED ORDER — ATORVASTATIN CALCIUM 10 MG PO TABS
40.0000 mg | ORAL_TABLET | Freq: Every day | ORAL | Status: AC
  Administered 2024-02-15 – 2024-02-16 (×2): 40 mg via ORAL
  Filled 2024-02-15 (×2): qty 4

## 2024-02-15 MED ORDER — LORAZEPAM 2 MG/ML IJ SOLN: 2.0000 mg | Freq: Three times a day (TID) | INTRAMUSCULAR | Status: AC | PRN

## 2024-02-15 MED ORDER — MAGNESIUM HYDROXIDE 400 MG/5ML PO SUSP: 30.0000 mL | Freq: Every day | ORAL | Status: AC | PRN

## 2024-02-15 MED ORDER — ARIPIPRAZOLE 5 MG PO TABS
5.0000 mg | ORAL_TABLET | Freq: Every day | ORAL | Status: AC
  Administered 2024-02-15 – 2024-02-16 (×2): 5 mg via ORAL
  Filled 2024-02-15 (×2): qty 1

## 2024-02-15 MED ORDER — HALOPERIDOL LACTATE 5 MG/ML IJ SOLN: 10.0000 mg | Freq: Three times a day (TID) | INTRAMUSCULAR | Status: AC | PRN

## 2024-02-15 MED ORDER — HYDROXYZINE HCL 25 MG PO TABS
25.0000 mg | ORAL_TABLET | Freq: Four times a day (QID) | ORAL | Status: AC | PRN
  Administered 2024-02-15 – 2024-02-16 (×3): 25 mg via ORAL
  Filled 2024-02-15 (×3): qty 1

## 2024-02-15 MED ORDER — METOPROLOL SUCCINATE ER 25 MG PO TB24
25.0000 mg | ORAL_TABLET | Freq: Every day | ORAL | Status: AC
  Administered 2024-02-15 – 2024-02-16 (×2): 25 mg via ORAL
  Filled 2024-02-15 (×2): qty 1

## 2024-02-15 MED ORDER — ACETAMINOPHEN 325 MG PO TABS
650.0000 mg | ORAL_TABLET | Freq: Four times a day (QID) | ORAL | Status: AC | PRN
  Administered 2024-02-15 – 2024-02-16 (×3): 650 mg via ORAL
  Filled 2024-02-15 (×3): qty 2

## 2024-02-15 MED ORDER — HALOPERIDOL 5 MG PO TABS
5.0000 mg | ORAL_TABLET | Freq: Three times a day (TID) | ORAL | Status: AC | PRN
  Administered 2024-02-16 (×2): 5 mg via ORAL
  Filled 2024-02-15 (×2): qty 1

## 2024-02-15 MED ORDER — DIPHENHYDRAMINE HCL 25 MG PO CAPS
50.0000 mg | ORAL_CAPSULE | Freq: Three times a day (TID) | ORAL | Status: AC | PRN
  Administered 2024-02-16 (×2): 50 mg via ORAL
  Filled 2024-02-15 (×2): qty 2

## 2024-02-15 MED ORDER — TRAZODONE HCL 50 MG PO TABS
50.0000 mg | ORAL_TABLET | Freq: Every evening | ORAL | Status: AC | PRN
  Filled 2024-02-15: qty 1

## 2024-02-15 MED ORDER — ALUM & MAG HYDROXIDE-SIMETH 200-200-20 MG/5ML PO SUSP: 30.0000 mL | ORAL | Status: AC | PRN

## 2024-02-15 MED ORDER — LEVETIRACETAM 500 MG PO TABS
500.0000 mg | ORAL_TABLET | Freq: Two times a day (BID) | ORAL | Status: AC
  Administered 2024-02-15 – 2024-02-16 (×4): 500 mg via ORAL
  Filled 2024-02-15 (×4): qty 1

## 2024-02-15 MED ORDER — HALOPERIDOL LACTATE 5 MG/ML IJ SOLN: 5.0000 mg | Freq: Three times a day (TID) | INTRAMUSCULAR | Status: AC | PRN

## 2024-02-15 NOTE — Plan of Care (Signed)
 Patient new to the unit haven't time to progress.  Problem: Education: Goal: Knowledge of [facility] Education information/materials will improve Outcome: Progressing Goal: Emotional status will improve Outcome: Progressing Goal: Mental status will improve Outcome: Progressing Goal: Verbalization of understanding the information provided will improve Outcome: Progressing   Problem: Activity: Goal: Interest or engagement in activities will improve Outcome: Progressing Goal: Sleeping patterns will improve Outcome: Progressing   Problem: Coping: Goal: Ability to verbalize frustrations and anger appropriately will improve Outcome: Progressing Goal: Ability to demonstrate self-control will improve Outcome: Progressing   Problem: Health Behavior/Discharge Planning: Goal: Identification of resources available to assist in meeting health care needs will improve Outcome: Progressing Goal: Compliance with treatment plan for underlying cause of condition will improve Outcome: Progressing   Problem: Physical Regulation: Goal: Ability to maintain clinical measurements within normal limits will improve Outcome: Progressing   Problem: Safety: Goal: Periods of time without injury will increase Outcome: Progressing   Problem: Education: Goal: Ability to make informed decisions regarding treatment will improve Outcome: Progressing   Problem: Coping: Goal: Coping ability will improve Outcome: Progressing   Problem: Health Behavior/Discharge Planning: Goal: Identification of resources available to assist in meeting health care needs will improve Outcome: Progressing   Problem: Medication: Goal: Compliance with prescribed medication regimen will improve Outcome: Progressing   Problem: Self-Concept: Goal: Ability to disclose and discuss suicidal ideas will improve Outcome: Progressing Goal: Will verbalize positive feelings about self Outcome: Progressing Note:      Problem: Education: Goal: Utilization of techniques to improve thought processes will improve Outcome: Progressing Goal: Knowledge of the prescribed therapeutic regimen will improve Outcome: Progressing   Problem: Activity: Goal: Interest or engagement in leisure activities will improve Outcome: Progressing Goal: Imbalance in normal sleep/wake cycle will improve Outcome: Progressing   Problem: Coping: Goal: Coping ability will improve Outcome: Progressing Goal: Will verbalize feelings Outcome: Progressing   Problem: Health Behavior/Discharge Planning: Goal: Ability to make decisions will improve Outcome: Progressing Goal: Compliance with therapeutic regimen will improve Outcome: Progressing   Problem: Role Relationship: Goal: Will demonstrate positive changes in social behaviors and relationships Outcome: Progressing   Problem: Safety: Goal: Ability to disclose and discuss suicidal ideas will improve Outcome: Progressing Goal: Ability to identify and utilize support systems that promote safety will improve Outcome: Progressing   Problem: Self-Concept: Goal: Will verbalize positive feelings about self Outcome: Progressing Goal: Level of anxiety will decrease Outcome: Progressing

## 2024-02-15 NOTE — Plan of Care (Signed)
 " Problem: Education: Goal: Knowledge of [facility] Education information/materials will improve 02/15/2024 1940 by Geneva Steward CROME, RN Outcome: Progressing 02/15/2024 0615 by Geneva Steward CROME, RN Outcome: Progressing Goal: Emotional status will improve 02/15/2024 1940 by Geneva Steward CROME, RN Outcome: Progressing 02/15/2024 0615 by Geneva Steward CROME, RN Outcome: Progressing Goal: Mental status will improve 02/15/2024 1940 by Geneva Steward CROME, RN Outcome: Progressing 02/15/2024 0615 by Geneva Steward CROME, RN Outcome: Progressing Goal: Verbalization of understanding the information provided will improve 02/15/2024 1940 by Geneva Steward CROME, RN Outcome: Progressing 02/15/2024 0615 by Geneva Steward CROME, RN Outcome: Progressing   Problem: Activity: Goal: Interest or engagement in activities will improve 02/15/2024 1940 by Geneva Steward CROME, RN Outcome: Progressing 02/15/2024 0615 by Geneva Steward CROME, RN Outcome: Progressing Goal: Sleeping patterns will improve 02/15/2024 1940 by Geneva Steward CROME, RN Outcome: Progressing 02/15/2024 0615 by Geneva Steward CROME, RN Outcome: Progressing   Problem: Coping: Goal: Ability to verbalize frustrations and anger appropriately will improve 02/15/2024 1940 by Geneva Steward CROME, RN Outcome: Progressing 02/15/2024 0615 by Geneva Steward CROME, RN Outcome: Progressing Goal: Ability to demonstrate self-control will improve 02/15/2024 1940 by Geneva Steward CROME, RN Outcome: Progressing 02/15/2024 0615 by Geneva Steward CROME, RN Outcome: Progressing   Problem: Health Behavior/Discharge Planning: Goal: Identification of resources available to assist in meeting health care needs will improve 02/15/2024 1940 by Geneva Steward CROME, RN Outcome: Progressing 02/15/2024 0615 by Geneva Steward CROME, RN Outcome: Progressing Goal: Compliance with treatment plan for underlying cause of condition will improve 02/15/2024 1940 by Geneva Steward CROME, RN Outcome: Progressing 02/15/2024 0615 by Geneva Steward CROME, RN Outcome:  Progressing   Problem: Physical Regulation: Goal: Ability to maintain clinical measurements within normal limits will improve 02/15/2024 1940 by Geneva Steward CROME, RN Outcome: Progressing 02/15/2024 0615 by Geneva Steward CROME, RN Outcome: Progressing   Problem: Safety: Goal: Periods of time without injury will increase 02/15/2024 1940 by Geneva Steward CROME, RN Outcome: Progressing 02/15/2024 0615 by Geneva Steward CROME, RN Outcome: Progressing   Problem: Education: Goal: Ability to make informed decisions regarding treatment will improve 02/15/2024 1940 by Geneva Steward CROME, RN Outcome: Progressing 02/15/2024 0615 by Geneva Steward CROME, RN Outcome: Progressing   Problem: Coping: Goal: Coping ability will improve 02/15/2024 1940 by Geneva Steward CROME, RN Outcome: Progressing 02/15/2024 0615 by Geneva Steward CROME, RN Outcome: Progressing   Problem: Health Behavior/Discharge Planning: Goal: Identification of resources available to assist in meeting health care needs will improve 02/15/2024 1940 by Geneva Steward CROME, RN Outcome: Progressing 02/15/2024 0615 by Geneva Steward CROME, RN Outcome: Progressing   Problem: Medication: Goal: Compliance with prescribed medication regimen will improve 02/15/2024 1940 by Geneva Steward CROME, RN Outcome: Progressing 02/15/2024 0615 by Geneva Steward CROME, RN Outcome: Progressing   Problem: Self-Concept: Goal: Ability to disclose and discuss suicidal ideas will improve 02/15/2024 1940 by Geneva Steward CROME, RN Outcome: Progressing 02/15/2024 0615 by Geneva Steward CROME, RN Outcome: Progressing Goal: Will verbalize positive feelings about self 02/15/2024 1940 by Geneva Steward CROME, RN Outcome: Progressing 02/15/2024 0615 by Geneva Steward CROME, RN Outcome: Progressing Note:     Problem: Education: Goal: Utilization of techniques to improve thought processes will improve 02/15/2024 1940 by Geneva Steward CROME, RN Outcome: Progressing 02/15/2024 0615 by Geneva Steward CROME, RN Outcome: Progressing Goal: Knowledge of  the prescribed therapeutic regimen will improve 02/15/2024 1940 by Geneva Steward CROME, RN Outcome: Progressing 02/15/2024 0615 by Geneva Steward CROME, RN Outcome: Progressing   Problem: Activity: Goal: Interest  or engagement in leisure activities will improve 02/15/2024 1940 by Geneva Steward CROME, RN Outcome: Progressing 02/15/2024 0615 by Geneva Steward CROME, RN Outcome: Progressing Goal: Imbalance in normal sleep/wake cycle will improve 02/15/2024 1940 by Geneva Steward CROME, RN Outcome: Progressing 02/15/2024 0615 by Geneva Steward CROME, RN Outcome: Progressing   Problem: Coping: Goal: Coping ability will improve 02/15/2024 1940 by Geneva Steward CROME, RN Outcome: Progressing 02/15/2024 0615 by Geneva Steward CROME, RN Outcome: Progressing Goal: Will verbalize feelings 02/15/2024 1940 by Geneva Steward CROME, RN Outcome: Progressing 02/15/2024 0615 by Geneva Steward CROME, RN Outcome: Progressing   Problem: Health Behavior/Discharge Planning: Goal: Ability to make decisions will improve 02/15/2024 1940 by Geneva Steward CROME, RN Outcome: Progressing 02/15/2024 0615 by Geneva Steward CROME, RN Outcome: Progressing Goal: Compliance with therapeutic regimen will improve 02/15/2024 1940 by Geneva Steward CROME, RN Outcome: Progressing 02/15/2024 0615 by Geneva Steward CROME, RN Outcome: Progressing   Problem: Role Relationship: Goal: Will demonstrate positive changes in social behaviors and relationships 02/15/2024 1940 by Geneva Steward CROME, RN Outcome: Progressing 02/15/2024 0615 by Geneva Steward CROME, RN Outcome: Progressing   Problem: Safety: Goal: Ability to disclose and discuss suicidal ideas will improve 02/15/2024 1940 by Geneva Steward CROME, RN Outcome: Progressing 02/15/2024 0615 by Geneva Steward CROME, RN Outcome: Progressing Goal: Ability to identify and utilize support systems that promote safety will improve 02/15/2024 1940 by Geneva Steward CROME, RN Outcome: Progressing 02/15/2024 0615 by Geneva Steward CROME, RN Outcome: Progressing   Problem:  Self-Concept: Goal: Will verbalize positive feelings about self 02/15/2024 1940 by Geneva Steward CROME, RN Outcome: Progressing 02/15/2024 0615 by Geneva Steward CROME, RN Outcome: Progressing Goal: Level of anxiety will decrease 02/15/2024 1940 by Geneva Steward CROME, RN Outcome: Progressing 02/15/2024 0615 by Geneva Steward CROME, RN Outcome: Progressing   "

## 2024-02-15 NOTE — Group Note (Signed)
 Recreation Therapy Group Note   Group Topic:Animal Assisted Therapy   Group Date: 02/15/2024 Start Time: 1015 End Time: 1040 Facilitators: Celestia Jeoffrey BRAVO, LRT, CTRS Location: Dayroom  Group Description: AAA. Animal-Assisted Activity provides opportunities for motivational, educational, therapeutic and/or recreational benefits to enhance quality of life. Selinda and Rollo visited the unit to interact with patients.   Goal Areas Addressed:  Reduced anxiety and stress Improved mood Increased social interaction Enhanced communication skills Reduced loneliness and isolation Improved emotional regulation   Affect/Mood: Appropriate   Participation Level: Active and Engaged   Participation Quality: Independent   Behavior: Calm and Cooperative   Speech/Thought Process: Coherent   Insight: Fair   Judgement: Fair    Modes of Intervention: Activity   Patient Response to Interventions:  Engaged, Interested , and Receptive   Education Outcome:  Acknowledges education   Clinical Observations/Individualized Feedback: Zamere was active in their participation of session activities and group discussion. Pt interacted well with LRT and peers duration of session.    Plan: Continue to engage patient in RT group sessions 2-3x/week.   Jeoffrey BRAVO Celestia, LRT, CTRS 02/15/2024 11:25 AM

## 2024-02-15 NOTE — Progress Notes (Incomplete)
" °   02/15/24 2100  Psych Admission Type (Psych Patients Only)  Admission Status Voluntary  Psychosocial Assessment  Patient Complaints Anxiety  Eye Contact Fair  Facial Expression Flat  Affect Anxious  Speech Logical/coherent  Interaction Assertive  Motor Activity Slow  Appearance/Hygiene Unremarkable  Behavior Characteristics Appropriate to situation;Cooperative;Anxious  Mood Pleasant  Thought Process  Coherency WDL  Content WDL  Delusions None reported or observed  Perception WDL  Hallucination None reported or observed  Judgment Limited  Confusion WDL  Danger to Self  Current suicidal ideation? Denies  Agreement Not to Harm Self Yes  Description of Agreement verbal  Danger to Others  Danger to Others None reported or observed    "

## 2024-02-15 NOTE — Group Note (Signed)
 Date:  02/15/2024 Time:  3:33 PM  Group Topic/Focus:  Making Healthy Choices:   The focus of this group is to help patients identify negative/unhealthy choices they were using prior to admission and identify positive/healthier coping strategies to replace them upon discharge.    Participation Level:  Active  Participation Quality:  Appropriate  Affect:  Appropriate  Cognitive:  Appropriate  Insight: Appropriate  Engagement in Group:  Engaged  Modes of Intervention:  Discussion   [name] 02/15/2024, 3:33 PM

## 2024-02-15 NOTE — [facility] Suicide Risk Assessment (Signed)
 [facility] Admission Suicide Risk Assessment   Nursing information obtained from:  Patient Demographic factors:  Male, Caucasian, Living alone, Unemployed Current Mental Status:  Suicidal ideation indicated by patient Loss Factors:  Financial problems / change in socioeconomic status Historical Factors:  NA Risk Reduction Factors:  Positive coping skills or problem solving skills  Total Time spent with patient: 30 minutes Principal Problem: <principal problem not specified> Diagnosis:  Active Problems:   MDD (major depressive disorder)   MDD (major depressive disorder), recurrent severe, without psychosis (HCC)  Subjective Data: TThe patient presented to psychiatry reporting significant decline in mental health over the past 2 weeks. He had been residing at [facility], a facility that prohibits psychiatric medications, which required him to discontinue his bipolar medications approximately 3 weeks ago. Two weeks prior to this visit, he experienced a seizure and was initiated on Keppra  by his PCP. Since stopping his bipolar medications and starting Keppra , he has noted progressive mood deterioration with emergence of suicidal ideation. He described his current state as having no hope, and I do not want to live with constant suicidal thoughts, though he denied having a specific plan at this time. He also endorsed command auditory hallucinations that began approximately 1 week ago, instructing him to kill himself.   Continued Clinical Symptoms:  Alcohol Use Disorder Identification Test Final Score (AUDIT): 0 The Alcohol Use Disorders Identification Test, Guidelines for Use in Primary Care, Second Edition.  World [employer] Writer [facility]). Score between 0-7:  no or low risk or alcohol related problems. Score between 8-15:  moderate risk of alcohol related problems. Score between 16-19:  high risk of alcohol related problems. Score 20 or above:  warrants further diagnostic evaluation for  alcohol dependence and treatment.   CLINICAL FACTORS:   Depression:   Hopelessness   Musculoskeletal: Strength & Muscle Tone: within normal limits Gait & Station: normal Patient leans: N/A  Psychiatric Specialty Exam:  Presentation  General Appearance:  Casual  Eye Contact: Fleeting  Speech: Normal Rate  Speech Volume: Decreased  Handedness: Right   Mood and Affect  Mood: Depressed; Dysphoric; Hopeless  Affect: Depressed; Flat   Thought Process  Thought Processes: Coherent  Descriptions of Associations:Intact  Orientation:Full (Time, Place and Person)  Thought Content:Logical  History of Schizophrenia/Schizoaffective disorder:No  Duration of Psychotic Symptoms:N/A  Hallucinations:Hallucinations: Auditory; Command  Ideas of Reference:None  Suicidal Thoughts:Suicidal Thoughts: Yes, Active SI Active Intent and/or Plan: With Plan; Without Means to Carry Out  Homicidal Thoughts:Homicidal Thoughts: No   Sensorium  Memory: Immediate Fair  Judgment: Impaired  Insight: Shallow   Executive Functions  Concentration: Fair  Attention Span: Fair  Recall: [employer] of Knowledge: Fair  Language: Fair   Psychomotor Activity  Psychomotor Activity: Psychomotor Activity: Normal   Assets  Assets: Manufacturing Systems Engineer; Desire for Improvement; Resilience   Sleep  Sleep: Sleep: Poor    Physical Exam: Physical Exam ROS Blood pressure 128/83, pulse (!) 108, temperature (!) 97.3 F (36.3 C), resp. rate 16, height 5' 9 (1.753 m), weight 76.7 kg, SpO2 97%. Body mass index is 24.96 kg/m.   COGNITIVE FEATURES THAT CONTRIBUTE TO RISK:  None    SUICIDE RISK:   Moderate:  Frequent suicidal ideation with limited intensity, and duration, some specificity in terms of plans, no associated intent, good self-control, limited dysphoria/symptomatology, some risk factors present, and identifiable protective factors, including available and  accessible social support.  PLAN OF CARE: Patient is admitted to [facility] unit with Q15 min  safety monitoring. Multidisciplinary team approach is offered. Medication management; group/milieu therapy is offered.   I certify that inpatient services furnished can reasonably be expected to improve the patient's condition.   Allyn Foil, MD 02/15/2024, 12:08 PM

## 2024-02-15 NOTE — Progress Notes (Signed)
" °   02/15/24 0443  Psych Admission Type (Psych Patients Only)  Admission Status Voluntary  Psychosocial Assessment  Patient Complaints Anxiety;Depression;Worrying  Eye Contact Fair  Facial Expression Flat  Affect Anxious;Depressed;Preoccupied  Speech Soft  Interaction Guarded  Motor Activity Slow  Appearance/Hygiene In scrubs  Behavior Characteristics Cooperative;Appropriate to situation;Guarded  Mood Anxious;Depressed;Preoccupied  Thought Process  Coherency WDL  Content WDL  Delusions None reported or observed  Perception WDL  Hallucination None reported or observed  Judgment Limited  Confusion WDL  Danger to Self  Current suicidal ideation? Denies  Agreement Not to Harm Self Yes  Description of Agreement verbal  Danger to Others  Danger to Others None reported or observed   Pt transferred from [facility] ED. Pt AOx4. Pt denies pain. Pt endorses I do not want to live, but I do not want to hurt myself no. Pt endorses anxiety and depression 10/10.  Pt endorses AH but, denies HI/VH. Pt reporting he is homeless. Pt reports history of a fall 2 days ago and a hx of seizures pt place on falls precaution. Pt reports goals are to have no SI thoughts and have control over my bipolar. Pt ambulatory. POC discussed. Pt oriented to unit and unit policies. No needs at this time. Q15 safety checks initiated. POC.  "

## 2024-02-15 NOTE — H&P (Signed)
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 Term Goal(s): Improvement in symptoms so as ready for discharge  Short Term Goals: Ability to identify changes in lifestyle to reduce recurrence of condition will improve and Ability to verbalize feelings will improve  I certify that inpatient services furnished can  reasonably be expected to improve the patient's condition.    Ezra Denne, MD 2/5/202612:09 PM     [1]  Allergies Allergen Reactions   Ketorolac  Tromethamine  Hives and Rash    Other Reaction(s): Not available   "

## 2024-02-15 NOTE — Progress Notes (Signed)
 SPIRITUAL CARE AND COUNSELING CONSULT NOTE   VISIT SUMMARY Initial visit for spiritual/emotional support while rounding on unit. Chaplain consulted with nursing team.   SPIRITUAL ENCOUNTER                                                                                                                                                                      Type of Visit: Initial Care provided to:: Patient Referral source: Nurse (RN/NT/LPN), Clinical staff Reason for visit: Routine spiritual support OnCall Visit: No   SPIRITUAL FRAMEWORK  Presenting Themes: Impactful experiences and emotions, Courage hope and growth, Significant life change Community/Connection: Family, Faith community Patient Stress Factors: Major life changes, Loss of control, Family relationships Family Stress Factors: Family relationships   GOALS   Self/Personal Goals: healing, support following discharge Clinical Care Goals: healing, support following discharge   INTERVENTIONS   Spiritual Care Interventions Made: Reflective listening, Normalization of emotions, Supported grief process, Encouragement, Established relationship of care and support, Explored values/beliefs/practices/strengths    INTERVENTION OUTCOMES   Outcomes: Awareness of support, Reduced fear, Connection to values and goals of care, Autonomy/agency  Chaplain provided compassionate presence, reflective listening, meaning oriented conversation, and open ended questions to elicit Joziah feelings about health status, treatment plan, family support, Sherlean faith background, and desired outcomes. Chaplain advocated for resources on patient behalf.   SPIRITUAL CARE PLAN   Spiritual Care Issues Still Outstanding: Chaplain will continue to follow    If immediate needs arise, please contact [facility] 24 hour on call [phone]   Barabara Chess, Chaplain  02/15/2024 1:41 PM

## 2024-02-15 NOTE — Group Note (Signed)
 LCSW Group Therapy Note  Group Date: 02/15/2024 Start Time: 1315 End Time: 1345   Type of Therapy and Topic:  Group Therapy - Healthy vs Unhealthy Coping Skills  Participation Level:  Active   Description of Group The focus of this group was to determine what unhealthy coping techniques typically are used by group members and what healthy coping techniques would be helpful in coping with various problems. Patients were guided in becoming aware of the differences between healthy and unhealthy coping techniques. Patients were asked to identify 2-3 healthy coping skills they would like to learn to use more effectively.  Therapeutic Goals Patients learned that coping is what human beings do all day long to deal with various situations in their lives Patients defined and discussed healthy vs unhealthy coping techniques Patients identified their preferred coping techniques and identified whether these were healthy or unhealthy Patients determined 2-3 healthy coping skills they would like to become more familiar with and use more often. Patients provided support and ideas to each other   Summary of Patient Progress: Patient proved open to input from peers and feedback from CSW. Patient demonstrated fair insight into the subject matter, was respectful of peers, and participated throughout the entire session.   Therapeutic Modalities Cognitive Behavioral Therapy Motivational Interviewing  [name], LCSWA 02/15/2024  2:14 PM

## 2024-02-15 NOTE — Tx Team (Signed)
 Initial Treatment Plan 02/15/2024 6:16 AM [name] FMW:[id number]    PATIENT STRESSORS: Financial difficulties   Health problems   Medication change or noncompliance     PATIENT STRENGTHS: Ability for insight  Communication skills  Motivation for treatment/growth    PATIENT IDENTIFIED PROBLEMS: SI  Depression   Anxiety                 DISCHARGE CRITERIA:  Ability to meet basic life and health needs Adequate post-discharge living arrangements Improved stabilization in mood, thinking, and/or behavior  PRELIMINARY DISCHARGE PLAN: Outpatient therapy  PATIENT/FAMILY INVOLVEMENT: This treatment plan has been presented to and reviewed with the patient, [name]. The patient and family have been given the opportunity to ask questions and make suggestions.  Steward LITTIE Bracket, RN 02/15/2024, 6:16 AM

## 2024-02-16 LAB — LIPID PANEL
Cholesterol: 95 mg/dL (ref 0–200)
HDL: 32 mg/dL — ABNORMAL LOW
LDL Cholesterol: 47 mg/dL (ref 0–99)
Total CHOL/HDL Ratio: 3 ratio
Triglycerides: 78 mg/dL
VLDL: 16 mg/dL (ref 0–40)

## 2024-02-16 LAB — HEMOGLOBIN A1C
Hgb A1c MFr Bld: 5.7 % — ABNORMAL HIGH (ref 4.8–5.6)
Mean Plasma Glucose: 116.89 mg/dL

## 2024-02-16 NOTE — BHH Group Notes (Signed)
 Spirituality Group   Description: Participant directed exploration of values, beliefs and meaning   **Focus on Gratitude: Invite reflection on sources of gratitude (external/internal); goal to invite internal gratitude to foster 1) reconnection with life-giving activities 2) self-compassion.   Following a brief framework of chaplains role and ground rules of group behavior, participants are invited to share concerns or questions that engage spiritual life. Emphasis placed on common themes and shared experiences and ways to make meaning and clarify living into ones values.   Theory/Process/Goal: Utilize the theoretical framework of group therapy established by Celena Kite, Relational Cultural Theory and Rogerian approaches to facilitate relational empathy and use of the here and now to foster reflection, self-awareness, and sharing.   Observations: Did not attend  [name] L. Delores HERO.Div

## 2024-02-16 NOTE — Group Note (Signed)
 Recreation Therapy Group Note   Group Topic:Emotion Expression  Group Date: 02/16/2024 Start Time: 1400 End Time: 1450 Facilitators: Celestia Jeoffrey BRAVO, LRT, CTRS Location: Dayroom  Group Description: Patients will engage in a guided art activity focused on painting a personal peaceful place. This group encourages relaxation, self-expression, and mindfulness through creative exploration. Participants are invited to imagine a location where they feel calm, safe, or content and represent it using paint and other art materials. Emphasis is placed on the process rather than artistic skill, allowing individuals to express emotions, reduce stress, and increase self-awareness in a supportive group setting.  Goal Area(s): Promote relaxation and stress reduction Enhance emotional expression and self-awareness Support coping skills and grounding techniques Encourage creativity and positive leisure engagement [place] interaction and group participation   Affect/Mood: Anxious and Flat   Participation Level: Minimal    Clinical Observations/Individualized Feedback: Malakye was present in group for 10 minutes. Pt was noted to be tearful and shared that he did not feel like painting or participating in group. LRT asked if there was anything I could do to help pt and pt replied no. Pt left group soon after.   Plan: Continue to engage patient in RT group sessions 2-3x/week.   Jeoffrey BRAVO Celestia, LRT, CTRS 02/16/2024 4:41 PM

## 2024-02-16 NOTE — Plan of Care (Signed)
   Problem: Education: Goal: Emotional status will improve Outcome: Not Progressing Goal: Mental status will improve Outcome: Not Progressing Goal: Verbalization of understanding the information provided will improve Outcome: Not Progressing   Problem: Activity: Goal: Interest or engagement in activities will improve Outcome: Not Progressing
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
## 2024-02-16 NOTE — Group Note (Signed)
 Date:  02/16/2024 Time:  11:36 AM  Group Topic/Focus:  Making Healthy Choices:   The focus of this group is to help patients identify negative/unhealthy choices they were using prior to admission and identify positive/healthier coping strategies to replace them upon discharge.    Participation Level:  Active  Participation Quality:  Appropriate  Affect:  Appropriate  Cognitive:  Appropriate  Insight: Appropriate  Engagement in Group:  Engaged  Modes of Intervention:  Discussion   Arland Nutting 02/16/2024, 11:36 AM

## 2024-02-16 NOTE — Progress Notes (Signed)
" °   02/15/24 2100  Psych Admission Type (Psych Patients Only)  Admission Status Voluntary  Psychosocial Assessment  Patient Complaints Anxiety  Eye Contact Fair  Facial Expression Flat  Affect Anxious  Speech Logical/coherent  Interaction Assertive  Motor Activity Slow  Appearance/Hygiene Unremarkable  Behavior Characteristics Appropriate to situation;Cooperative;Anxious  Mood Pleasant  Thought Process  Coherency WDL  Content WDL  Delusions None reported or observed  Perception WDL  Hallucination None reported or observed  Judgment Limited  Confusion WDL  Danger to Self  Current suicidal ideation? Passive  Self-Injurious Behavior No self-injurious ideation or behavior indicators observed or expressed   Agreement Not to Harm Self Yes  Description of Agreement verbal  Danger to Others  Danger to Others None reported or observed    "

## 2024-02-16 NOTE — Group Note (Signed)
 Physical/Occupational Therapy Group Note  Group Topic: UE Therex   Group Date: 02/16/2024 Start Time: 1305 End Time: 1335 Facilitators: Clive Warren CROME, OT   Group Description: Group instructed in series of upper extremities exercises, aimed to promote strength, flexibility, range of motion and functional endurance.  Patients provided cuing for proper mechanics and proper pace of exercise; exercises adjusted as necessary for individualized patient needs.  Patient also engaged in cognitive components throughout session, working to integrate attention to task, command following, turn-taking and appropriate social interaction throughout session.  Allowed to ask questions as appropriate, and encouraged to identify specific exercises that they could complete independently outside of group sessions.  Therapeutic Goal(s):  Demonstrate appropriate performance of upper extremity exercises to promote strength, flexibility, range of motion and functional endurance Identify 2-3 specific upper extremity exercises to complete as home exercise program outside of group session  Individual Participation: Pt engaged and actively participated throughout. Very minimal cues for technique with good carryover. Limited engagement in group discussion.   Participation Level: Active and Engaged   Participation Quality: Minimal Cues   Behavior: Alert, Appropriate, Attentive , Calm, and Cooperative   Speech/Thought Process: Relevant   Affect/Mood: Appropriate   Insight: Moderate   Judgement: Moderate   Modes of Intervention: Activity, Clarification, Discussion, Education, Exploration, Problem-solving, Rapport Building, Socialization, and Support  Patient Response to Interventions:  Attentive, Engaged, and Interested    Plan: Continue to engage patient in PT/OT groups 1 - 2x/week.  Maysa Lynn R., MPH, MS, OTR/L ascom [phone] 02/16/24, 2:33 PM

## 2024-02-16 NOTE — Group Note (Signed)
 Date:  02/16/2024 Time:  9:30 PM  Group Topic/Focus:  Overcoming Stress:   The focus of this group is to define stress and help patients assess their triggers.    Participation Level:  Active  Participation Quality:  Appropriate  Affect:  Appropriate  Cognitive:  Appropriate  Insight: Good  Engagement in Group:  Engaged  Modes of Intervention:  Discussion  Additional Comments:    [name] 02/16/2024, 9:30 PM

## 2024-02-16 NOTE — Progress Notes (Signed)
" °   02/15/24 1612  Spiritual Encounters  Type of Visit Initial  Care provided to: Patient  Referral source Chaplain assessment  Reason for visit Routine spiritual support  OnCall Visit No  Interventions  Spiritual Care Interventions Made Compassionate presence;Reflective listening;Supported grief process;Reconciliation with self/others   I provided spiritual care support to Lynwood Sharps who is well known to this chaplain.  Mr Tourigny shared having received meaningful support from Chaplain Korrin. But also grateful for this visit since we have a well established relationship.  Lark processed with me recent events. Narrative held elements of self-blame and challenge to balance this with accountability. He named his ongoing Christian faith as source of strength and meaning, the acquired skill of journaling, but ongoing pain from life losses.  I provided compassionate presence and because of past life review I understood and helped name the many losses Montgomery continues to cope with (traumatic loss of brother and feelings of responsibility, loss of partner and home, her decline with dementia, coping with loss of stability and home due to substance misuse). I facilitated naming these as ongoing grief/complex grief and also used intervention of reconciliation within self to balance accountability v. self-blame. I explored with Orville meaning in the recovery of his bag of belongings as a sign of G*d's presence and also the deep meaning in [place] Korin's visit and sharing of scripture.  I encouraged rest and will continue to follow.  Tzvi Economou L. Delores HERO.Div "

## 2024-02-16 NOTE — Progress Notes (Signed)
 [facility] MD Progress Note  02/16/2024 9:24 AM [name]  MRN:  [id number]  The patient presented to psychiatry reporting significant decline in mental health over the past 2 weeks. He had been residing at [facility], a facility that prohibits psychiatric medications, which required him to discontinue his bipolar medications approximately 3 weeks ago. Two weeks prior to this visit, he experienced a seizure and was initiated on Keppra  by his PCP. Since stopping his bipolar medications and starting Keppra , he has noted progressive mood deterioration with emergence of suicidal ideation. He described his current state as having no hope, and I do not want to live with constant suicidal thoughts, though he denied having a specific plan at this time. He also endorsed command auditory hallucinations that began approximately 1 week ago, instructing him to kill himself. Patient is admitted to [facility] unit with Q15 min safety monitoring. Multidisciplinary team approach is offered. Medication management; group/milieu therapy is offered.  Subjective:  Chart reviewed, case discussed in multidisciplinary meeting, patient seen during rounds.   Today patient met with the treatment team.  He reports worsening depression and ongoing suicidal ideation.  He reports that he is preoccupied with the thoughts of hopelessness and wished he does not wake up.  He informs to child psychotherapist that he does need housing/place to live.  Patient and provider discussed at length the new diagnosis of atrial flutter and his worsening anxiety.  Patient is encouraged to maintain his appetite and hydration.  Patient reports ongoing auditory hallucinations telling him to hurt himself.  Patient is taking his medications with no reported side effects.  Past Psychiatric History: see h&P Family History:  Family History  Problem Relation Age of Onset   CVA Mother        deceased at age 95   Depression Brother        Died by  suicide at age 44   Social History:  Social History   Substance and Sexual Activity  Alcohol Use Not Currently   Comment: rare     Social History   Substance and Sexual Activity  Drug Use Not Currently   Types: Marijuana, Cocaine   Comment: last use January 2025, cocaine    Social History   Socioeconomic History   Marital status: Single    Spouse name: Not on file   Number of children: Not on file   Years of education: Not on file   Highest education level: Not on file  Occupational History   Not on file  Tobacco Use   Smoking status: Former    Current packs/day: 0.00    Average packs/day: 0.8 packs/day for 20.0 years (15.0 ttl pk-yrs)    Types: Cigarettes    Start date: 05/16/1964    Quit date: 05/16/1984    Years since quitting: 39.7   Smokeless tobacco: Never  Vaping Use   Vaping status: Never Used  Substance and Sexual Activity   Alcohol use: Not Currently    Comment: rare   Drug use: Not Currently    Types: Marijuana, Cocaine    Comment: last use January 2025, cocaine   Sexual activity: Not Currently  Other Topics Concern   Not on file  Social History Narrative   ** Merged History Encounter **       Social Drivers of Health   Tobacco Use: Medium Risk (02/15/2024)   Patient History    Smoking Tobacco Use: Former    Smokeless Tobacco Use: Never  Passive Exposure: Not on file  Financial Resource Strain: Low Risk (01/18/2024)   Overall Financial Resource Strain (CARDIA)    Difficulty of Paying Living Expenses: Not very hard  Food Insecurity: Food Insecurity Present (02/15/2024)   Epic    Worried About Programme Researcher, Broadcasting/film/video in the Last Year: Often true    Barista in the Last Year: Often true  Transportation Needs: Unmet Transportation Needs (02/15/2024)   Epic    Lack of Transportation (Medical): Yes    Lack of Transportation (Non-Medical): Yes  Physical Activity: Not on file  Stress: Not on file  Social Connections: Unknown (02/15/2024)   Social  Connection and Isolation Panel    Frequency of Communication with Friends and Family: Never    Frequency of Social Gatherings with Friends and Family: Never    Attends Religious Services: Never    Database Administrator or Organizations: Yes    Attends Banker Meetings: Never    Marital Status: Patient declined  Depression (PHQ2-9): Low Risk (02/09/2024)   Depression (PHQ2-9)    PHQ-2 Score: 0  Recent Concern: Depression (PHQ2-9) - Medium Risk (01/26/2024)   Depression (PHQ2-9)    PHQ-2 Score: 9  Alcohol Screen: Low Risk (02/15/2024)   Alcohol Screen    Last Alcohol Screening Score (AUDIT): 0  Housing: High Risk (02/15/2024)   Epic    Unable to Pay for Housing in the Last Year: Patient declined    Number of Times Moved in the Last Year: 2    Homeless in the Last Year: Yes  Utilities: Patient Declined (02/15/2024)   Epic    Threatened with loss of utilities: Patient declined  Health Literacy: Not on file   Past Medical History:  Past Medical History:  Diagnosis Date   Anxiety    Arthritis    knees and hands   Bipolar 1 disorder, depressed (HCC)    Bipolar disorder (HCC)    Depression    GERD (gastroesophageal reflux disease)    Hepatitis    HEP C   History of kidney stones    Hypertension    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 echo 5/17: EF 60-65%, no RWMA, LV dias fxn nl, LA mildly dilated, RV sys fxn nl, PASP nl, moderate sized circumferential pericardial effusion was identified, 2.12 cm around the LV free wall, <1 cm around the RV free wall. Features were not c/w tamponade physiology   PTSD (post-traumatic stress disorder)    Witnessed brother's suicide.   Restless leg syndrome    Seizures (HCC)    Syncope     Past Surgical History:  Procedure Laterality Date   APPENDECTOMY     CARDIOVERSION N/A 04/17/2023   Procedure: CARDIOVERSION;  Surgeon: Darliss Rogue, MD;  Location: [facility] ORS;  Service:  Cardiovascular;  Laterality: N/A;   CYSTOSCOPY WITH URETEROSCOPY AND STENT PLACEMENT     ESOPHAGOGASTRODUODENOSCOPY N/A 01/11/2016   Procedure: ESOPHAGOGASTRODUODENOSCOPY (EGD);  Surgeon: Jerrell Sol, MD;  Location[facility] ENDOSCOPY;  Service: Endoscopy;  Laterality: N/A;   ESOPHAGOGASTRODUODENOSCOPY N/A 04/09/2020   Procedure: ESOPHAGOGASTRODUODENOSCOPY (EGD);  Surgeon: Therisa Bi, MD;  Location: [facility] ENDOSCOPY;  Service: Gastroenterology;  Laterality: N/A;   INCISION AND DRAINAGE ABSCESS Left 01/02/2018   Procedure: INCISION AND DRAINAGE LEFT KNEE;  Surgeon: Cleotilde Barrio, MD;  Location: [facility] ORS;  Service: Orthopedics;  Laterality: Left;   JOINT REPLACEMENT Right    TKR  KNEE ARTHROSCOPY Right 06/25/2014   Procedure: ARTHROSCOPY KNEE;  Surgeon: Kayla Pinal, MD;  Location: [facility] ORS;  Service: Orthopedics;  Laterality: Right;  partial arthroscopic medial menisectomy   LAPAROSCOPIC APPENDECTOMY N/A 06/02/2021   Procedure: APPENDECTOMY LAPAROSCOPIC;  Surgeon: Lane Shope, MD;  Location: [facility] ORS;  Service: General;  Laterality: N/A;   TEE WITHOUT CARDIOVERSION N/A 04/17/2023   Procedure: ECHOCARDIOGRAM, TRANSESOPHAGEAL;  Surgeon: Darliss Rogue, MD;  Location: [facility] ORS;  Service: Cardiovascular;  Laterality: N/A;   TOTAL KNEE ARTHROPLASTY Right 04/22/2015   Procedure: TOTAL KNEE ARTHROPLASTY;  Surgeon: Kayla Pinal, MD;  Location: [facility] ORS;  Service: Orthopedics;  Laterality: Right;   TOTAL KNEE ARTHROPLASTY Left 10/30/2017   Procedure: TOTAL KNEE ARTHROPLASTY;  Surgeon: Pinal Kayla, MD;  Location: [facility] ORS;  Service: Orthopedics;  Laterality: Left;   TOTAL KNEE REVISION Left 01/02/2018   Procedure: poly exchange of tibia and patella left knee;  Surgeon: Pinal Kayla, MD;  Location: [facility] ORS;  Service: Orthopedics;  Laterality: Left;   UMBILICAL HERNIA REPAIR  06/02/2021   Procedure: HERNIA REPAIR UMBILICAL ADULT;  Surgeon: Lane Shope, MD;  Location: [facility] ORS;   Service: General;;    Current Medications: Current Facility-Administered Medications  Medication Dose Route Frequency Provider Last Rate Last Admin   acetaminophen  (TYLENOL ) tablet 650 mg  650 mg Oral Q6H PRN Trudy Carwin, NP   650 mg at 02/15/24 2109   alum & mag hydroxide-simeth (MAALOX/MYLANTA) 200-200-20 MG/5ML suspension 30 mL  30 mL Oral Q4H PRN Trudy Carwin, NP       ARIPiprazole  (ABILIFY ) tablet 5 mg  5 mg Oral Daily Eldredge Veldhuizen, MD   5 mg at 02/16/24 0919   atorvastatin  (LIPITOR) tablet 40 mg  40 mg Oral Daily Carollee Nussbaumer, MD   40 mg at 02/16/24 9080   diphenhydrAMINE  (BENADRYL ) capsule 25 mg  25 mg Oral Q6H PRN Barrie Wale, MD       haloperidol  (HALDOL ) tablet 5 mg  5 mg Oral TID PRN Trudy Carwin, NP       And   diphenhydrAMINE  (BENADRYL ) capsule 50 mg  50 mg Oral TID PRN Trudy Carwin, NP       haloperidol  lactate (HALDOL ) injection 5 mg  5 mg Intramuscular TID PRN Trudy Carwin, NP       And   diphenhydrAMINE  (BENADRYL ) injection 50 mg  50 mg Intramuscular TID PRN Trudy Carwin, NP       And   LORazepam  (ATIVAN ) injection 2 mg  2 mg Intramuscular TID PRN Trudy Carwin, NP       haloperidol  lactate (HALDOL ) injection 10 mg  10 mg Intramuscular TID PRN Trudy Carwin, NP       And   diphenhydrAMINE  (BENADRYL ) injection 50 mg  50 mg Intramuscular TID PRN Trudy Carwin, NP       And   LORazepam  (ATIVAN ) injection 2 mg  2 mg Intramuscular TID PRN Trudy Carwin, NP       hydrOXYzine  (ATARAX ) tablet 25 mg  25 mg Oral Q6H PRN Zaquan Duffner, MD   25 mg at 02/15/24 2109   levETIRAcetam  (KEPPRA ) tablet 500 mg  500 mg Oral BID Alysen Smylie, MD   500 mg at 02/16/24 9080   magnesium  hydroxide (MILK OF MAGNESIA) suspension 30 mL  30 mL Oral Daily PRN Trudy Carwin, NP       metoprolol  succinate (TOPROL -XL) 24 hr tablet 25 mg  25 mg Oral Daily Iram Lundberg, MD   25 mg at 02/16/24 [phone]  sertraline  (ZOLOFT ) tablet 50 mg  50 mg Oral Q breakfast Gene Glazebrook, MD   50 mg  at 02/16/24 0754   traZODone  (DESYREL ) tablet 50 mg  50 mg Oral QHS PRN Kylina Vultaggio, MD        Lab Results:  Results for orders placed or performed during the hospital encounter of 02/15/24 (from the past 48 hours)  Lipid panel     Status: Abnormal   Collection Time: 02/16/24  6:42 AM  Result Value Ref Range   Cholesterol 95 0 - 200 mg/dL    Comment:        ATP III CLASSIFICATION:  <200     mg/dL   Desirable  799-760  mg/dL   Borderline High  >=759    mg/dL   High           Triglycerides 78 <150 mg/dL   HDL 32 (L) >59 mg/dL   Total CHOL/HDL Ratio 3.0 RATIO   VLDL 16 0 - 40 mg/dL   LDL Cholesterol 47 0 - 99 mg/dL    Comment:        Total Cholesterol/HDL:CHD Risk Coronary Heart Disease Risk Table                     Men   Women  1/2 Average Risk   3.4   3.3  Average Risk       5.0   4.4  2 X Average Risk   9.6   7.1  3 X Average Risk  23.4   11.0        Use the calculated Patient Ratio above and the CHD Risk Table to determine the patient's CHD Risk.        ATP III CLASSIFICATION (LDL):  <100     mg/dL   Optimal  899-870  mg/dL   Near or Above                    Optimal  130-159  mg/dL   Borderline  839-810  mg/dL   High  >809     mg/dL   Very High Performed at [facility], [address] [place], [place] [address]     Blood Alcohol level:  Lab Results  Component Value Date   [facility] <15 02/14/2024   [facility] <15 05/29/2023    Metabolic Disorder Labs: Lab Results  Component Value Date   HGBA1C 5.7 (H) 12/01/2023   MPG 93.93 06/01/2023   MPG 111.15 12/29/2022   No results found for: PROLACTIN Lab Results  Component Value Date   CHOL 95 02/16/2024   TRIG 78 02/16/2024   HDL 32 (L) 02/16/2024   CHOLHDL 3.0 02/16/2024   VLDL 16 02/16/2024   LDLCALC 47 02/16/2024   LDLCALC 118 (H) 12/01/2023    Physical Findings: AIMS:  , ,  ,  ,    CIWA:    COWS:      Psychiatric Specialty Exam:  Presentation  General Appearance:  Casual  Eye  Contact: Fleeting  Speech: Normal Rate  Speech Volume: Decreased    Mood and Affect  Mood: Depressed; Dysphoric; Hopeless  Affect: Depressed; Flat   Thought Process  Thought Processes: Coherent  Orientation:Full (Time, Place and Person)  Thought Content:Logical  Hallucinations:Hallucinations: Auditory; Command  Ideas of Reference:None  Suicidal Thoughts:Suicidal Thoughts: Yes, Active SI Active Intent and/or Plan: With Plan; Without Means to Carry Out  Homicidal Thoughts:Homicidal Thoughts: No   Sensorium  Memory: Immediate Fair  Judgment: Impaired  Insight: Shallow   Executive Functions  Concentration: Fair  Attention Span: Fair  Recall: [employer] of Knowledge: Fair  Language: Fair   Psychomotor Activity  Psychomotor Activity: Psychomotor Activity: Normal  Musculoskeletal: Strength & Muscle Tone: within normal limits Gait & Station: normal Assets  Assets: Manufacturing Systems Engineer; Desire for Improvement; Resilience    Physical Exam: Physical Exam ROS Blood pressure (!) 123/98, pulse 83, temperature 98.6 F (37 C), resp. rate 14, height 5' 9 (1.753 m), weight 76.7 kg, SpO2 98%. Body mass index is 24.96 kg/m.  Diagnosis: Active Problems:   MDD (major depressive disorder)   MDD (major depressive disorder), recurrent severe, without psychosis (HCC)   Treatment Plan Summary:  Safety and Monitoring:             -- Voluntary admission to inpatient psychiatric unit for safety, stabilization and treatment             -- Daily contact with patient to assess and evaluate symptoms and progress in treatment             -- Patient's case to be discussed in multi-disciplinary team meeting             -- Observation Level: q15 minute checks             -- Vital signs:  q12 hours             -- Precautions: suicide, elopement, and assault   2. Psychiatric Diagnoses and Treatment:         Abilify  5 mg daily, Zoloft  50 mg daily to help  with depression and mood stabilization.     -- The risks/benefits/side-effects/alternatives to this medication were discussed in detail with the patient and time was given for questions. The patient consents to medication trial.                -- Metabolic profile and EKG monitoring obtained while on an atypical antipsychotic (BMI: Lipid Panel: HbgA1c: QTc:)              -- Encouraged patient to participate in unit milieu and in scheduled group therapies               4. Discharge Planning:   -- Social work and case management to assist with discharge planning and identification of hospital follow-up needs prior to discharge  -- Estimated LOS: 3-4 days  Kaizer Dissinger, MD 02/16/2024, 9:24 AM

## 2024-02-16 NOTE — BH IP Treatment Plan (Signed)
 Interdisciplinary Treatment and Diagnostic Plan Update  02/16/2024 Time of Session: 11:06 AM  [name] MRN: [id number]  Principal Diagnosis: <principal problem not specified>  Secondary Diagnoses: Active Problems:   MDD (major depressive disorder)   MDD (major depressive disorder), recurrent severe, without psychosis (HCC)   Current Medications:  Current Facility-Administered Medications  Medication Dose Route Frequency Provider Last Rate Last Admin   acetaminophen  (TYLENOL ) tablet 650 mg  650 mg Oral Q6H PRN Trudy Carwin, NP   650 mg at 02/16/24 1028   alum & mag hydroxide-simeth (MAALOX/MYLANTA) 200-200-20 MG/5ML suspension 30 mL  30 mL Oral Q4H PRN Trudy Carwin, NP       ARIPiprazole  (ABILIFY ) tablet 5 mg  5 mg Oral Daily Jadapalle, Sree, MD   5 mg at 02/16/24 0919   atorvastatin  (LIPITOR) tablet 40 mg  40 mg Oral Daily Jadapalle, Sree, MD   40 mg at 02/16/24 9080   diphenhydrAMINE  (BENADRYL ) capsule 25 mg  25 mg Oral Q6H PRN Jadapalle, Sree, MD       haloperidol  (HALDOL ) tablet 5 mg  5 mg Oral TID PRN Trudy Carwin, NP       And   diphenhydrAMINE  (BENADRYL ) capsule 50 mg  50 mg Oral TID PRN Trudy Carwin, NP       haloperidol  lactate (HALDOL ) injection 5 mg  5 mg Intramuscular TID PRN Trudy Carwin, NP       And   diphenhydrAMINE  (BENADRYL ) injection 50 mg  50 mg Intramuscular TID PRN Trudy Carwin, NP       And   LORazepam  (ATIVAN ) injection 2 mg  2 mg Intramuscular TID PRN Trudy Carwin, NP       haloperidol  lactate (HALDOL ) injection 10 mg  10 mg Intramuscular TID PRN Trudy Carwin, NP       And   diphenhydrAMINE  (BENADRYL ) injection 50 mg  50 mg Intramuscular TID PRN Trudy Carwin, NP       And   LORazepam  (ATIVAN ) injection 2 mg  2 mg Intramuscular TID PRN Trudy Carwin, NP       hydrOXYzine  (ATARAX ) tablet 25 mg  25 mg Oral Q6H PRN Jadapalle, Sree, MD   25 mg at 02/15/24 2109   levETIRAcetam  (KEPPRA ) tablet 500 mg  500 mg Oral BID Jadapalle, Sree, MD   500 mg at  02/16/24 9080   magnesium  hydroxide (MILK OF MAGNESIA) suspension 30 mL  30 mL Oral Daily PRN Trudy Carwin, NP       metoprolol  succinate (TOPROL -XL) 24 hr tablet 25 mg  25 mg Oral Daily Jadapalle, Sree, MD   25 mg at 02/16/24 9080   sertraline  (ZOLOFT ) tablet 50 mg  50 mg Oral Q breakfast Jadapalle, Sree, MD   50 mg at 02/16/24 9245   traZODone  (DESYREL ) tablet 50 mg  50 mg Oral QHS PRN Jadapalle, Sree, MD       PTA Medications: Medications Prior to Admission  Medication Sig Dispense Refill Last Dose/Taking   atorvastatin  (LIPITOR) 40 MG tablet Take 1 tablet (40 mg total) by mouth daily. 90 tablet 3    levETIRAcetam  (KEPPRA ) 500 MG tablet Take 1 tablet (500 mg total) by mouth 2 (two) times daily. 60 tablet 1    metoprolol  succinate (TOPROL -XL) 25 MG 24 hr tablet Take 1 tablet (25 mg total) by mouth daily. 90 tablet 3     Patient Stressors: Financial difficulties   Health problems   Medication change or noncompliance    Patient Strengths: Ability for insight  Communication  skills  Motivation for treatment/growth   Treatment Modalities: Medication Management, Group therapy, Case management,  1 to 1 session with clinician, Psychoeducation, Recreational therapy.   Physician Treatment Plan for Primary Diagnosis: <principal problem not specified> Long Term Goal(s): Improvement in symptoms so as ready for discharge   Short Term Goals: Ability to identify changes in lifestyle to reduce recurrence of condition will improve Ability to verbalize feelings will improve Ability to disclose and discuss suicidal ideas Ability to demonstrate self-control will improve Ability to identify and develop effective coping behaviors will improve  Medication Management: Evaluate patient's response, side effects, and tolerance of medication regimen.  Therapeutic Interventions: 1 to 1 sessions, Unit Group sessions and Medication administration.  Evaluation of Outcomes: Progressing  Physician Treatment  Plan for Secondary Diagnosis: Active Problems:   MDD (major depressive disorder)   MDD (major depressive disorder), recurrent severe, without psychosis (HCC)  Long Term Goal(s): Improvement in symptoms so as ready for discharge   Short Term Goals: Ability to identify changes in lifestyle to reduce recurrence of condition will improve Ability to verbalize feelings will improve Ability to disclose and discuss suicidal ideas Ability to demonstrate self-control will improve Ability to identify and develop effective coping behaviors will improve     Medication Management: Evaluate patient's response, side effects, and tolerance of medication regimen.  Therapeutic Interventions: 1 to 1 sessions, Unit Group sessions and Medication administration.  Evaluation of Outcomes: Progressing   RN Treatment Plan for Primary Diagnosis: <principal problem not specified> Long Term Goal(s): Knowledge of disease and therapeutic regimen to maintain health will improve  Short Term Goals: Ability to remain free from injury will improve, Ability to verbalize frustration and anger appropriately will improve, Ability to demonstrate self-control, Ability to participate in decision making will improve, Ability to verbalize feelings will improve, Ability to disclose and discuss suicidal ideas, Ability to identify and develop effective coping behaviors will improve, and Compliance with prescribed medications will improve  Medication Management: RN will administer medications as ordered by provider, will assess and evaluate patient's response and provide education to patient for prescribed medication. RN will report any adverse and/or side effects to prescribing provider.  Therapeutic Interventions: 1 on 1 counseling sessions, Psychoeducation, Medication administration, Evaluate responses to treatment, Monitor vital signs and CBGs as ordered, Perform/monitor CIWA, COWS, AIMS and Fall Risk screenings as ordered, Perform  wound care treatments as ordered.  Evaluation of Outcomes: Progressing   LCSW Treatment Plan for Primary Diagnosis: <principal problem not specified> Long Term Goal(s): Safe transition to appropriate next level of care at discharge, Engage patient in therapeutic group addressing interpersonal concerns.  Short Term Goals: Engage patient in aftercare planning with referrals and resources, Increase social support, Increase ability to appropriately verbalize feelings, Increase emotional regulation, Facilitate acceptance of mental health diagnosis and concerns, Facilitate patient progression through stages of change regarding substance use diagnoses and concerns, Identify triggers associated with mental health/substance abuse issues, and Increase skills for wellness and recovery  Therapeutic Interventions: Assess for all discharge needs, 1 to 1 time with Social worker, Explore available resources and support systems, Assess for adequacy in community support network, Educate family and significant other(s) on suicide prevention, Complete Psychosocial Assessment, Interpersonal group therapy.  Evaluation of Outcomes: Progressing   Progress in Treatment: Attending groups: Yes. and No. Participating in groups: Yes. and No. Taking medication as prescribed: Yes. Toleration medication: Yes. Family/Significant other contact made: No, will contact:  CSW will contact pt's brother, [name] [phone] Patient understands  diagnosis: Yes. Discussing patient identified problems/goals with staff: Yes. Medical problems stabilized or resolved: Yes. Denies suicidal/homicidal ideation: No. and As evidenced by:  provider progress notes  Issues/concerns per patient self-inventory: No. Other: None   New problem(s) identified: No, Describe:  None identified   New Short Term/Long Term Goal(s): elimination of symptoms of psychosis, medication management for mood stabilization; elimination of SI  thoughts; development of comprehensive mental wellness/sobriety plan.   Patient Goals:  Right now, I just need a place to live   Discharge Plan or Barriers: CSW will assist with appropriate discharge planning   Reason for Continuation of Hospitalization: Depression Medication stabilization Suicidal ideation  Estimated Length of Stay: 1 to 7 days  Last 3 [place] Suicide Severity Risk Score: Flowsheet Row Admission (Current) from 02/15/2024 in [facility] ED from 02/14/2024 in [facility] Emergency Department at [facility] (Discharged[facility]  C-SSRS RISK CATEGORY Low Risk Low Risk High Risk    Last PHQ 2/9 Scores:    02/09/2024    3:56 PM 01/26/2024    2:01 PM 12/01/2023    1:34 PM  Depression screen PHQ 2/9  Decreased Interest 0 0 0  Down, Depressed, Hopeless 0 3 0  PHQ - 2 Score 0 3 0  Altered sleeping 0  0  Tired, decreased energy 0 3 0  Change in appetite 0 3 0  Feeling bad or failure about yourself  0 0 0  Trouble concentrating 0 0 0  Moving slowly or fidgety/restless 0 0 0  Suicidal thoughts 0 0 0  PHQ-9 Score 0  0  Difficult doing work/chores Not difficult at all Not difficult at all Not difficult at all    Scribe for Treatment Team: Lum JONETTA Croft, LCSWA 02/16/2024 11:21 AM

## 2024-02-16 NOTE — Progress Notes (Signed)
 Patient alert and responsive. Accepted medications without issue. Able to make needs known. PRN medications this shift for headache X1 and anxiety X2 with minimal effectiveness. Ongoing monitoring continues.   02/16/24 1000  Psych Admission Type (Psych Patients Only)  Admission Status Voluntary  Psychosocial Assessment  Patient Complaints Anxiety;Self-harm thoughts  Eye Contact Fair  Facial Expression Flat  Affect Anxious  Speech Logical/coherent  Interaction Assertive  Motor Activity Slow  Appearance/Hygiene Unremarkable  Behavior Characteristics Appropriate to situation;Cooperative  Mood Depressed  Thought Process  Coherency WDL  Content WDL  Delusions None reported or observed  Perception WDL  Hallucination None reported or observed  Judgment Limited  Confusion WDL  Danger to Self  Current suicidal ideation? Passive  Self-Injurious Behavior No self-injurious ideation or behavior indicators observed or expressed   Agreement Not to Harm Self Yes  Description of Agreement verbal  Danger to Others  Danger to Others None reported or observed

## 2024-03-08 ENCOUNTER — Ambulatory Visit: Payer: MEDICAID
